# Patient Record
Sex: Female | Born: 1955
Health system: Southern US, Community
[De-identification: ages and names within clinical notes are randomized; demographics above are authoritative.]

## PROBLEM LIST (undated history)

## (undated) ENCOUNTER — Emergency Department (HOSPITAL_COMMUNITY): Admission: EM | Payer: Medicare HMO

## (undated) DIAGNOSIS — F32A Depression, unspecified: Secondary | ICD-10-CM

## (undated) DIAGNOSIS — F319 Bipolar disorder, unspecified: Secondary | ICD-10-CM

## (undated) DIAGNOSIS — F1120 Opioid dependence, uncomplicated: Secondary | ICD-10-CM

## (undated) DIAGNOSIS — G8929 Other chronic pain: Secondary | ICD-10-CM

## (undated) DIAGNOSIS — R531 Weakness: Secondary | ICD-10-CM

## (undated) DIAGNOSIS — C50919 Malignant neoplasm of unspecified site of unspecified female breast: Secondary | ICD-10-CM

## (undated) DIAGNOSIS — F329 Major depressive disorder, single episode, unspecified: Secondary | ICD-10-CM

## (undated) DIAGNOSIS — F419 Anxiety disorder, unspecified: Secondary | ICD-10-CM

## (undated) DIAGNOSIS — Z5189 Encounter for other specified aftercare: Secondary | ICD-10-CM

## (undated) DIAGNOSIS — K219 Gastro-esophageal reflux disease without esophagitis: Secondary | ICD-10-CM

## (undated) DIAGNOSIS — F1910 Other psychoactive substance abuse, uncomplicated: Secondary | ICD-10-CM

## (undated) DIAGNOSIS — R0600 Dyspnea, unspecified: Secondary | ICD-10-CM

## (undated) DIAGNOSIS — I509 Heart failure, unspecified: Secondary | ICD-10-CM

## (undated) DIAGNOSIS — G459 Transient cerebral ischemic attack, unspecified: Secondary | ICD-10-CM

## (undated) DIAGNOSIS — F332 Major depressive disorder, recurrent severe without psychotic features: Secondary | ICD-10-CM

## (undated) DIAGNOSIS — M549 Dorsalgia, unspecified: Secondary | ICD-10-CM

## (undated) DIAGNOSIS — I10 Essential (primary) hypertension: Secondary | ICD-10-CM

## (undated) DIAGNOSIS — N83209 Unspecified ovarian cyst, unspecified side: Secondary | ICD-10-CM

## (undated) DIAGNOSIS — A4902 Methicillin resistant Staphylococcus aureus infection, unspecified site: Secondary | ICD-10-CM

## (undated) DIAGNOSIS — B1920 Unspecified viral hepatitis C without hepatic coma: Secondary | ICD-10-CM

## (undated) DIAGNOSIS — N189 Chronic kidney disease, unspecified: Secondary | ICD-10-CM

## (undated) HISTORY — DX: Unspecified ovarian cyst, unspecified side: N83.209

## (undated) HISTORY — DX: Methicillin resistant Staphylococcus aureus infection, unspecified site: A49.02

## (undated) HISTORY — PX: MYOMECTOMY: SHX85

## (undated) HISTORY — DX: Gastro-esophageal reflux disease without esophagitis: K21.9

## (undated) HISTORY — DX: Weakness: R53.1

## (undated) HISTORY — DX: Other psychoactive substance abuse, uncomplicated: F19.10

## (undated) HISTORY — DX: Anxiety disorder, unspecified: F41.9

## (undated) HISTORY — PX: WISDOM TOOTH EXTRACTION: SHX21

## (undated) HISTORY — DX: Encounter for other specified aftercare: Z51.89

## (undated) HISTORY — PX: UPPER GASTROINTESTINAL ENDOSCOPY: SHX188

## (undated) HISTORY — DX: Malignant neoplasm of unspecified site of unspecified female breast: C50.919

## (undated) HISTORY — PX: COLONOSCOPY: SHX174

## (undated) HISTORY — DX: Major depressive disorder, recurrent severe without psychotic features: F33.2

## (undated) HISTORY — DX: Bipolar disorder, unspecified: F31.9

## (undated) HISTORY — DX: Chronic kidney disease, unspecified: N18.9

## (undated) HISTORY — PX: ESOPHAGOGASTRODUODENOSCOPY: SHX1529

## (undated) HISTORY — PX: BREAST SURGERY: SHX581

## (undated) HISTORY — PX: TOTAL ABDOMINAL HYSTERECTOMY: SHX209

## (undated) HISTORY — DX: Depression, unspecified: F32.A

## (undated) HISTORY — PX: MULTIPLE TOOTH EXTRACTIONS: SHX2053

## (undated) HISTORY — DX: Heart failure, unspecified: I50.9

---
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
## 1988-07-13 HISTORY — PX: BACK SURGERY: SHX140

## 1999-08-16 ENCOUNTER — Emergency Department (HOSPITAL_COMMUNITY): Admission: EM | Admit: 1999-08-16 | Discharge: 1999-08-16 | Payer: Self-pay | Admitting: Emergency Medicine

## 1999-08-16 ENCOUNTER — Encounter: Payer: Self-pay | Admitting: Emergency Medicine

## 1999-08-29 ENCOUNTER — Emergency Department (HOSPITAL_COMMUNITY): Admission: EM | Admit: 1999-08-29 | Discharge: 1999-08-29 | Payer: Self-pay | Admitting: Emergency Medicine

## 1999-09-05 ENCOUNTER — Emergency Department (HOSPITAL_COMMUNITY): Admission: EM | Admit: 1999-09-05 | Discharge: 1999-09-05 | Payer: Self-pay | Admitting: Emergency Medicine

## 2000-01-02 ENCOUNTER — Emergency Department (HOSPITAL_COMMUNITY): Admission: EM | Admit: 2000-01-02 | Discharge: 2000-01-02 | Payer: Self-pay | Admitting: Emergency Medicine

## 2000-02-24 ENCOUNTER — Emergency Department (HOSPITAL_COMMUNITY): Admission: EM | Admit: 2000-02-24 | Discharge: 2000-02-24 | Payer: Self-pay | Admitting: Emergency Medicine

## 2000-03-11 ENCOUNTER — Emergency Department (HOSPITAL_COMMUNITY): Admission: EM | Admit: 2000-03-11 | Discharge: 2000-03-11 | Payer: Self-pay | Admitting: *Deleted

## 2000-03-14 ENCOUNTER — Emergency Department (HOSPITAL_COMMUNITY): Admission: EM | Admit: 2000-03-14 | Discharge: 2000-03-14 | Payer: Self-pay | Admitting: Emergency Medicine

## 2000-04-09 ENCOUNTER — Emergency Department (HOSPITAL_COMMUNITY): Admission: EM | Admit: 2000-04-09 | Discharge: 2000-04-09 | Payer: Self-pay

## 2000-05-21 ENCOUNTER — Emergency Department (HOSPITAL_COMMUNITY): Admission: EM | Admit: 2000-05-21 | Discharge: 2000-05-21 | Payer: Self-pay | Admitting: *Deleted

## 2000-06-07 ENCOUNTER — Emergency Department (HOSPITAL_COMMUNITY): Admission: EM | Admit: 2000-06-07 | Discharge: 2000-06-07 | Payer: Self-pay | Admitting: Emergency Medicine

## 2000-06-07 ENCOUNTER — Encounter: Payer: Self-pay | Admitting: Emergency Medicine

## 2000-06-08 ENCOUNTER — Emergency Department (HOSPITAL_COMMUNITY): Admission: EM | Admit: 2000-06-08 | Discharge: 2000-06-08 | Payer: Self-pay | Admitting: Emergency Medicine

## 2000-07-13 HISTORY — PX: LAPAROSCOPIC CHOLECYSTECTOMY: SUR755

## 2000-08-03 ENCOUNTER — Emergency Department (HOSPITAL_COMMUNITY): Admission: EM | Admit: 2000-08-03 | Discharge: 2000-08-03 | Payer: Self-pay | Admitting: *Deleted

## 2000-08-08 ENCOUNTER — Emergency Department (HOSPITAL_COMMUNITY): Admission: EM | Admit: 2000-08-08 | Discharge: 2000-08-08 | Payer: Self-pay

## 2001-03-24 ENCOUNTER — Emergency Department (HOSPITAL_COMMUNITY): Admission: EM | Admit: 2001-03-24 | Discharge: 2001-03-24 | Payer: Self-pay | Admitting: Emergency Medicine

## 2001-04-06 ENCOUNTER — Emergency Department (HOSPITAL_COMMUNITY): Admission: EM | Admit: 2001-04-06 | Discharge: 2001-04-07 | Payer: Self-pay | Admitting: Emergency Medicine

## 2001-04-21 ENCOUNTER — Encounter: Payer: Self-pay | Admitting: Emergency Medicine

## 2001-04-21 ENCOUNTER — Emergency Department (HOSPITAL_COMMUNITY): Admission: EM | Admit: 2001-04-21 | Discharge: 2001-04-21 | Payer: Self-pay | Admitting: Emergency Medicine

## 2001-05-14 ENCOUNTER — Emergency Department (HOSPITAL_COMMUNITY): Admission: EM | Admit: 2001-05-14 | Discharge: 2001-05-14 | Payer: Self-pay | Admitting: *Deleted

## 2001-07-07 ENCOUNTER — Ambulatory Visit (HOSPITAL_COMMUNITY): Admission: RE | Admit: 2001-07-07 | Discharge: 2001-07-07 | Payer: Self-pay | Admitting: Neurosurgery

## 2001-07-20 ENCOUNTER — Ambulatory Visit (HOSPITAL_COMMUNITY): Admission: RE | Admit: 2001-07-20 | Discharge: 2001-07-20 | Payer: Self-pay | Admitting: Neurosurgery

## 2001-08-17 ENCOUNTER — Observation Stay (HOSPITAL_COMMUNITY): Admission: RE | Admit: 2001-08-17 | Discharge: 2001-08-18 | Payer: Self-pay | Admitting: Neurosurgery

## 2001-11-10 ENCOUNTER — Emergency Department (HOSPITAL_COMMUNITY): Admission: EM | Admit: 2001-11-10 | Discharge: 2001-11-10 | Payer: Self-pay | Admitting: Emergency Medicine

## 2001-12-07 ENCOUNTER — Emergency Department (HOSPITAL_COMMUNITY): Admission: EM | Admit: 2001-12-07 | Discharge: 2001-12-07 | Payer: Self-pay | Admitting: Emergency Medicine

## 2002-01-15 ENCOUNTER — Emergency Department (HOSPITAL_COMMUNITY): Admission: EM | Admit: 2002-01-15 | Discharge: 2002-01-16 | Payer: Self-pay | Admitting: Emergency Medicine

## 2002-01-21 ENCOUNTER — Emergency Department (HOSPITAL_COMMUNITY): Admission: EM | Admit: 2002-01-21 | Discharge: 2002-01-21 | Payer: Self-pay | Admitting: Emergency Medicine

## 2002-05-04 ENCOUNTER — Emergency Department (HOSPITAL_COMMUNITY): Admission: EM | Admit: 2002-05-04 | Discharge: 2002-05-04 | Payer: Self-pay | Admitting: *Deleted

## 2002-05-14 ENCOUNTER — Emergency Department (HOSPITAL_COMMUNITY): Admission: EM | Admit: 2002-05-14 | Discharge: 2002-05-15 | Payer: Self-pay

## 2002-07-31 ENCOUNTER — Ambulatory Visit (HOSPITAL_COMMUNITY): Admission: RE | Admit: 2002-07-31 | Discharge: 2002-08-02 | Payer: Self-pay | Admitting: Neurosurgery

## 2002-08-10 ENCOUNTER — Emergency Department (HOSPITAL_COMMUNITY): Admission: EM | Admit: 2002-08-10 | Discharge: 2002-08-10 | Payer: Self-pay

## 2002-10-20 ENCOUNTER — Emergency Department (HOSPITAL_COMMUNITY): Admission: EM | Admit: 2002-10-20 | Discharge: 2002-10-21 | Payer: Self-pay | Admitting: Emergency Medicine

## 2003-02-10 ENCOUNTER — Emergency Department (HOSPITAL_COMMUNITY): Admission: EM | Admit: 2003-02-10 | Discharge: 2003-02-10 | Payer: Self-pay | Admitting: Emergency Medicine

## 2003-02-12 ENCOUNTER — Emergency Department (HOSPITAL_COMMUNITY): Admission: EM | Admit: 2003-02-12 | Discharge: 2003-02-12 | Payer: Self-pay | Admitting: Emergency Medicine

## 2005-12-06 ENCOUNTER — Emergency Department (HOSPITAL_COMMUNITY): Admission: EM | Admit: 2005-12-06 | Discharge: 2005-12-07 | Payer: Self-pay | Admitting: Emergency Medicine

## 2006-03-22 ENCOUNTER — Emergency Department (HOSPITAL_COMMUNITY): Admission: EM | Admit: 2006-03-22 | Discharge: 2006-03-22 | Payer: Self-pay | Admitting: Emergency Medicine

## 2006-04-01 ENCOUNTER — Emergency Department (HOSPITAL_COMMUNITY): Admission: EM | Admit: 2006-04-01 | Discharge: 2006-04-01 | Payer: Self-pay | Admitting: Emergency Medicine

## 2008-07-13 HISTORY — PX: BREAST SURGERY: SHX581

## 2009-03-14 ENCOUNTER — Ambulatory Visit: Payer: Self-pay | Admitting: Gastroenterology

## 2009-05-02 ENCOUNTER — Ambulatory Visit: Payer: Self-pay | Admitting: Gastroenterology

## 2009-05-20 ENCOUNTER — Encounter (INDEPENDENT_AMBULATORY_CARE_PROVIDER_SITE_OTHER): Payer: Self-pay | Admitting: Interventional Radiology

## 2009-05-20 ENCOUNTER — Ambulatory Visit (HOSPITAL_COMMUNITY): Admission: RE | Admit: 2009-05-20 | Discharge: 2009-05-20 | Payer: Self-pay | Admitting: Gastroenterology

## 2009-06-13 ENCOUNTER — Ambulatory Visit: Payer: Self-pay | Admitting: Gastroenterology

## 2010-01-12 ENCOUNTER — Emergency Department (HOSPITAL_COMMUNITY)
Admission: EM | Admit: 2010-01-12 | Discharge: 2010-01-12 | Payer: Self-pay | Source: Home / Self Care | Admitting: Emergency Medicine

## 2010-09-28 LAB — URINALYSIS, ROUTINE W REFLEX MICROSCOPIC
Bilirubin Urine: NEGATIVE
Glucose, UA: NEGATIVE mg/dL
Hgb urine dipstick: NEGATIVE
Ketones, ur: NEGATIVE mg/dL
Nitrite: NEGATIVE
Protein, ur: NEGATIVE mg/dL
Specific Gravity, Urine: 1.013 (ref 1.005–1.030)
Urobilinogen, UA: 0.2 mg/dL (ref 0.0–1.0)
pH: 6 (ref 5.0–8.0)

## 2010-10-15 LAB — CBC
HCT: 39.8 % (ref 36.0–46.0)
Hemoglobin: 13.2 g/dL (ref 12.0–15.0)
MCHC: 33.1 g/dL (ref 30.0–36.0)
MCV: 89.8 fL (ref 78.0–100.0)
Platelets: 222 10*3/uL (ref 150–400)
RBC: 4.43 MIL/uL (ref 3.87–5.11)
RDW: 13.8 % (ref 11.5–15.5)
WBC: 6.6 10*3/uL (ref 4.0–10.5)

## 2010-10-15 LAB — PROTIME-INR
INR: 0.98 (ref 0.00–1.49)
Prothrombin Time: 12.9 seconds (ref 11.6–15.2)

## 2010-10-15 LAB — APTT: aPTT: 28 seconds (ref 24–37)

## 2010-11-28 NOTE — Discharge Summary (Signed)
NAME:  Sherry [name], Sherry [name] NO.:  [id number]   MEDICAL RECORD NO.:  [id number]                   PATIENT TYPE:  OIB   LOCATION:  3010                                 FACILITY:  [facility]   PHYSICIAN:  Ophelia Charter, M.D.            DATE OF BIRTH:  1956-01-10   DATE OF ADMISSION:  07/31/2002  DATE OF DISCHARGE:  08/02/2002                                 DISCHARGE SUMMARY   BRIEF HISTORY:  The patient is a 55 year old, white female who suffers from  back and right leg pain.  She failed medical management.  She was worked up  with a lumbar MRI, which demonstrated a herniated disc in L5-S1 on the  right.  The findings of this patient's signs, symptoms, and physical exam  were consistent with a right S1 radiculopathy.  I have discussed various  treatments with the patient including surgery.  The patient weighed the  risks, benefits, and alternatives to surgery and decided to proceed with a  microdiscectomy.   For further details of this admission, please refer to the history and  physical.   HOSPITAL COURSE:  I admitted the patient to [facility] on  07/31/2002.  The day of admission I performed a right L5-S1 microdiscectomy.  The surgery went well without complications.   POSTOPERATIVE COURSE:  The patient's postoperative course was fairly  unremarkable other than she had a rather atypical amount of pain, which was  controlled with morphine, and by postop day #1 she did not feel able to go  home and she stayed until postop day #2.  By then she was eating well and  ambulating well.  Her wound was healing well.  She was afebrile and her  vital signs were stable and her pain was under adequate control with p.o.  Percocet for pain and p.o. Valium for muscle spasms.   The patient was discharged on 08/02/2002.   DISCHARGE INSTRUCTIONS:  The patient was given written discharge  instructions.  Instructed to follow up with me in four weeks.   DISCHARGE MEDICATIONS:  Valium 10 mg #42, one p.o. q.8h. p.r.n. for muscle  spasm, one refill, Percocet 10/325 #100 one p.o. q.3h. for pain and she was  given a second prescription for Percocet 10/325 #100 one p.o. q.4h. for  pain, which was not to be filled until 08/14/2002.   I did have a discussion with the patient.  She has a history of drug abuse  and I explained to her that it was important to follow my directions  regarding the medication usage and that if she abuses medications she could  severely harm herself including overdose, liver failure, etc.  She  understands these risks.  I explained to her that I would not refill her  prescriptions early.   FINAL DIAGNOSES:  Right L5-S1 herniated pulposus procedure performed.  This  right L5-S1 microdiscectomy using microdissection.  Ophelia Charter, M.D.   JDJ/MEDQ  D:  08/02/2002  T:  08/03/2002  Job:  [id number]

## 2010-11-28 NOTE — Op Note (Signed)
[place]  Patient:    Sherry [name], Sherry [name] Visit Number: [id number] MRN: [id number]          Service Type: SUR Location: [place] 01 Attending Physician:  Ophelia Charter Dictated by:   Ophelia Charter, M.D. Proc. Date: 08/17/01 Admit Date:  08/17/2001 Discharge Date: 08/18/2001                             Operative Report  PREOPERATIVE DIAGNOSIS:  C4-5 and C5-6 spondylosis, degenerative disease, stenosis, cervicalgia and cervical radiculopathy.  POSTOPERATIVE DIAGNOSIS:  C4-5 and C5-6 spondylosis, degenerative disease, stenosis, cervicalgia and cervical radiculopathy.  OPERATION:  C4-5 and C5-6 extensive anterior cervical diskectomy, interbody iliac crest allograft, arthrodesis, anterior cervical plating.  Codman titanium plate and screws.  SURGEON:  Ophelia Charter, M.D.  ASSISTANT:  Elizabeth Sauer, M.D.  ANESTHESIA:  General endotracheal.  ESTIMATED BLOOD LOSS:   150 cc.  SPECIMENS:  None.  INDICATIONS:  The patient is a 55 year old black female who has suffered from neck and bilateral arm pain.  She failed medical management and was worked up as an outpatient with a cervical MRI followed by cervical CT which demonstrated cervical spondylosis at C4-5 and C5-6.  She weighed the risks, benefits and alternatives to surgery and decided to proceed with a C4-5, C5-6 anterior cervical diskectomy and fusion and plating.  DESCRIPTION OF PROCEDURE:  The patient was brought to the operating room by the anesthesia team.  General endotracheal anesthesia was induced.  The patient remained in a supine position.  A roll was placed under her shoulders to place her neck in slight extension.  The anterior cervical region was then prepared with Betadine scrub and Betadine solution.  Sterile drapes were applied.  I then injected the area to be incised with Marcaine and with epinephrine solution.  I then made a transverse incision in the patients  left anterior neck.  I used Metzenbaum scissors to divide the platysma muscle and then dissected medial to the sternocleidomastoid muscle, jugular vein and carotid artery. I bluntly dissected down toward the anterior cervical spine, carefully identifying the esophagus, retracting it medially.  I cleared the soft tissue from the anterior cervical spine using Kittner swabs and then inserted the spinal needle into the upper exposed interspace.  I then obtained intraoperative radiograph to confirm my location.  I then used electrocautery to detach the medial border of the longus coli muscle bilaterally from the C4-5 and C5-6 intervertebral disk space.  I inserted the Caspar self-retaining retractor for exposure.  I then incised the C4-5 intervertebral disk with the 15 blade scalpel.  I encountered anterior bone spurs which I drilled away with the high speed drill. I then performed a partial diskectomy with pituitary forceps and inserted the traction sutures in C4 and C5 and then distracted the interspace.  I then brought the operative microscope into the field.  Under its illumination and magnification, I completed the decompression.  I used the high speed drill to decorticate the vertebral end plates at C4 and C5.  The disk space was quite collapsed and spondylotic.  I then drilled away the remainder of the intravertebral disk.  I drilled away some large posterior osteophytes which were compressing the thecal sac.  I tented up the posterior longitudinal ligament with the drill and then incised it with a arachnoid knife and removed it with the Kerrison punch undercutting the vertebral end plates at C4  and C5.  I performed a foraminotomy about the bilateral C5 nerve root.  I then repeated this procedure at C5-6.  I excised the disk and performed partial diskectomy with the pituitary forceps.  I removed the distraction screw from C4, placed it in C5-6 interspace and then used the high speed  drill to decorticate the vertebral end plates at 075-GRM and drill away the remainder of the invertebral disk at C5-6.  I thinned out the posterior longitudinal ligament.  The bone spurs were not quite as large at this level.  I incised the ligament with the arachnoid knife and then removed it with the Kerrison punch undercutting the vertebral end plates at C5 and C6, decompressing the thecal sac and then performed a foraminotomy about the bilateral C6 nerve root.  I now turned my attention to the arthrodesis.  I obtained iliac crest tricortical allograft bone graft and fashioned it across the dimensions approximately 7 mm in height, 1 cm in depth.  I inserted the blunt bone graft into the C5-6 interspace, removed the distraction screw from the C6, placed it back at C4, dissected C4-5 interspace and placed a second bone graft in the C4-5 interspace.  I removed the distraction screws.  There was a generous amount of _____ bone graft at both levels.  I now turned my attention to the anterior spinal instrumentation.  I used the high speed drill to remove some further anterior bone spurs so that the plate would lie flat.  I obtained the appropriate length Codman anterior cervical plate and laid it along the anterior aspect of the vertebral bodies from C4 to C6, drilled two holes at each vertebrae, tapped the holes and then secured the plate with two 12 mm screws at C4, C5 and C6.  I then obtained an intraoperative radiograph.  There was limited visualization of the lower screws but they appeared good.  I then secured the screws to the plate using the cam tightener at each screw.  I then achieved stringent hemostasis using bipolar electrocautery. Dictated by:   Ophelia Charter, M.D. Attending Physician:  Newman Pies D DD:  08/17/01 TD:  08/18/01 Job: [id number]

## 2010-11-28 NOTE — Op Note (Signed)
[place]  Patient:    Sherry [name], Sherry [name] Visit Number: [id number] MRN: [id number]          Service Type: SUR Location: [place] 01 Attending Physician:  Ophelia Charter Dictated by:   Ophelia Charter, M.D. Admit Date:  08/17/2001 Discharge Date: 08/18/2001                             Operative Report  I achieved stringent hemostasis using bipolar electrocautery.  I copiously irrigated the wound with bacitracin solution and removed the solution.  I then removed the Caspar self-retaining retractor.  I inspected the esophagus for any damage and there was none apparent.  I then reapproximated the patients platysma muscle with interrupted 3-0 Vicryl suture, the subcutaneous tissue with interrupted 3-0 Vicryl suture, and the skin with Steri-Strips and benzoin.  The wound was then coated with bacitracin ointment, a sterile dressing applied, and the drapes were removed.   The patient was subsequently extubated by the anesthesia team and transported to the post anesthesia care unit in stable condition.  All sponge, needle and instrument counts were correct at the end of this case. Dictated by:   Ophelia Charter, M.D. Attending Physician:  Newman Pies D DD:  08/17/01 TD:  08/18/01 Job: [id number]

## 2010-11-28 NOTE — Op Note (Signed)
NAME:  Sherry [name], Sherry [name] NO.:  [id number]   MEDICAL RECORD NO.:  [id number]                   PATIENT TYPE:  OIB   LOCATION:  3010                                 FACILITY:  [place]   PHYSICIAN:  Ophelia Charter, M.D.            DATE OF BIRTH:  1955-12-25   DATE OF PROCEDURE:  07/31/2002  DATE OF DISCHARGE:                                 OPERATIVE REPORT   INDICATIONS:  This is a 55 year old black female who suffered from back and  right leg pain.  She has failed medical management.  She was worked up with  a lumbar MRI which demonstrated a herniated disk at L5-S1 on the right.  The  patient's signs and symptoms on her examination were consistent with a right  S1 radiculopathy.  I discussed the various treatment options with her  including surgery.  The patient weighed the risks, benefits and alternatives  of surgery and decided to proceed with a microdiskectomy.   PREOPERATIVE DIAGNOSES:  1. Right L5-S1 herniated nucleus pulposus.  2. Spinal stenosis.  3. Degenerative disk disease.  4. Spondylosis.  5. Lumbar radiculopathy.  6. Lumbago.   POSTOPERATIVE DIAGNOSES:  1. Right L5-S1 herniated nucleus pulposus.  2. Spinal stenosis.  3. Degenerative disk disease.  4. Spondylosis.  5. Lumbar radiculopathy.  6. Lumbago.   PROCEDURE:  Right L5 to S1 microdiskectomy using microdissection.   SURGEON:  Ophelia Charter, M.D.   ASSISTANT:  Marchia Meiers. Vertell Limber, M.D.   ANESTHESIA:  General endotracheal.   ESTIMATED BLOOD LOSS:  Minimal.   SPECIMENS:  None.   DRAINS:  None.   COMPLICATIONS:  None.   DESCRIPTION OF PROCEDURE:  The patient was brought to the operating room by  the anesthesia team.  General endotracheal anesthesia was induced.  The  patient was turned to the prone position on the Wilson frame.  The  lumbosacral region was then prepared with Betadine scrub followed by  Betadine solution.  Sterile drapes were then applied.  I then  injected the  anteromedial spine with Marcaine with epinephrine solution.  I used a  scalpel to make a linear midline incision over the L5-S1 interspace.  I used  electrocautery to dissect down through the thoracolumbar fascia, dividing  the fascia just to the right of midline, performing a right-sided  subperiosteal dissection superior to the paraspinous musculature from the  right spinous process and lamina of L5 in the upper sacrum, inserting the  McCullough retractor for exposure. I then obtained an intraoperative  radiograph to confirm the location.   I then used the high-speed drill to perform a right L5 laminotomy.  I  widened the laminotomy with a Kerrison punch and removed the right L5-S1  ligamentum flavum using the Kerrison punch.  I then performed a foraminotomy  about the right S1 nerve root.  I used the microdissectors to free up the  thecal sac  and the S1 nerve root from the epidural tissue, and then Dr.  Vertell Limber gently retracted the nerve root medially with the D'Errico retractors.  This exposed an underlying herniated disk which had migrated caudally from  the disk space.  I incised into the herniated disk and removed several  fragments of disk from just ventral to the S1 vertebral body.  There was  considerable spondylosis at the endplates at 075-GRM, and we removed this with  the osteophyte tool.  We performed a partial intervertebral diskectomy using  the pituitary forceps and the Epstein curettes.  At this point we palpated  along the ventral surface of the thecal sac and along the exit boundary of  the S1 nerve root and noted that neural structures were well decompressed.  We obtained stringent hemostasis using bipolar electrocautery.  The wound  was copiously irrigated with bacitracin solution.  The solution was removed,  as was the [facility].  The patient's thoracolumbar fascia was  reapproximated with interrupted #1 Vicryl suture, subcutaneous with   interrupted 2-0 Vicryl suture, and the skin with Steri-Strips and Benzoin.  The wound was then coated with bacitracin ointment.  Sterile dressings were  applied, and the drapes were removed.  The patient was subsequently returned  to the supine position where she was extubated by the anesthesia team and  transported to the postanesthesia care unit in stable condition.  All  sponge, instrument and needle counts were correct at the end of this case.                                               Ophelia Charter, M.D.    JDJ/MEDQ  D:  07/31/2002  T:  08/01/2002  Job:  [id number]

## 2011-09-13 ENCOUNTER — Encounter (HOSPITAL_COMMUNITY): Payer: Self-pay | Admitting: *Deleted

## 2011-09-13 ENCOUNTER — Emergency Department (HOSPITAL_COMMUNITY)
Admission: EM | Admit: 2011-09-13 | Discharge: 2011-09-13 | Disposition: A | Discharge status: Home / Self Care | Payer: Medicare Other | Attending: Emergency Medicine | Admitting: Emergency Medicine

## 2011-09-13 ENCOUNTER — Emergency Department (HOSPITAL_COMMUNITY): Payer: Medicare Other

## 2011-09-13 DIAGNOSIS — IMO0001 Reserved for inherently not codable concepts without codable children: Secondary | ICD-10-CM

## 2011-09-13 DIAGNOSIS — M79609 Pain in unspecified limb: Secondary | ICD-10-CM | POA: Insufficient documentation

## 2011-09-13 DIAGNOSIS — W19XXXA Unspecified fall, initial encounter: Secondary | ICD-10-CM

## 2011-09-13 DIAGNOSIS — Y9229 Other specified public building as the place of occurrence of the external cause: Secondary | ICD-10-CM | POA: Insufficient documentation

## 2011-09-13 DIAGNOSIS — I10 Essential (primary) hypertension: Secondary | ICD-10-CM | POA: Insufficient documentation

## 2011-09-13 DIAGNOSIS — R079 Chest pain, unspecified: Secondary | ICD-10-CM | POA: Insufficient documentation

## 2011-09-13 DIAGNOSIS — IMO0002 Reserved for concepts with insufficient information to code with codable children: Secondary | ICD-10-CM | POA: Insufficient documentation

## 2011-09-13 DIAGNOSIS — M545 Low back pain, unspecified: Secondary | ICD-10-CM | POA: Insufficient documentation

## 2011-09-13 DIAGNOSIS — W010XXA Fall on same level from slipping, tripping and stumbling without subsequent striking against object, initial encounter: Secondary | ICD-10-CM | POA: Insufficient documentation

## 2011-09-13 HISTORY — DX: Essential (primary) hypertension: I10

## 2011-09-13 MED ORDER — HYDROCODONE-ACETAMINOPHEN 5-325 MG PO TABS: 2.0000 | ORAL_TABLET | ORAL | Status: AC | PRN

## 2011-09-13 MED ORDER — DIAZEPAM 5 MG PO TABS: 5.0000 mg | ORAL_TABLET | Freq: Two times a day (BID) | ORAL | Status: AC

## 2011-09-13 MED ORDER — OXYCODONE-ACETAMINOPHEN 5-325 MG PO TABS
1.0000 | ORAL_TABLET | Freq: Once | ORAL | Status: AC
  Administered 2011-09-13: 1 via ORAL
  Filled 2011-09-13: qty 1

## 2011-09-13 NOTE — ED Provider Notes (Signed)
History     CSN: [id number]  Arrival date & time 09/13/11  0909   First MD Initiated Contact with Patient 09/13/11 [phone]      Chief Complaint  Patient presents with  . Fall  . Back Pain  . Leg Pain    right  . Flank Pain    left    (Consider location/radiation/quality/duration/timing/severity/associated sxs/prior treatment) HPI  56 year old obese female with history of prior back surgery is presenting to the ED with a chief complaint of back pain. He states 2 days ago she was at a store where she actually tripped over a dolly and fell forward. She denies hitting head or having loss of consciousness. She landed on her left side and initially having some pain to the associated left chest wall.  She denies any shortness of breath. However the next day she noticed increasing pain to her low back and pain radiating down to her right leg. Pain worsened with sitting or with certain movement. She denies urinary or bowel incontinence, cauda equina symptoms, weakness or numbness.    Past Medical History  Diagnosis Date  . Hypertension     Past Surgical History  Procedure Date  . Back surgery   . Cholecystectomy   . Myomectomy   . Abdominal hysterectomy     History reviewed. No pertinent family history.  History  Substance Use Topics  . Smoking status: Current Everyday Smoker -- 1.0 packs/day    Types: Cigarettes  . Smokeless tobacco: Never Used  . Alcohol Use: No    OB History    Grav Para Term Preterm Abortions TAB SAB Ect Mult Living                  Review of Systems  All other systems reviewed and are negative.    Allergies  Review of patient's allergies indicates no known allergies.  Home Medications  No current outpatient prescriptions on file.  BP 116/73  Pulse 80  Temp(Src) 98.1 F (36.7 C) (Oral)  Resp 18  Wt 243 lb (110.224 kg)  SpO2 99%  Physical Exam  Nursing note and vitals reviewed. Constitutional: She appears well-developed and  well-nourished. No distress.       Obese  HENT:  Head: Atraumatic.  Eyes: Conjunctivae are normal.  Neck: Neck supple.  Cardiovascular: Normal rate and regular rhythm.   Pulmonary/Chest: Effort normal. No respiratory distress. She exhibits no tenderness.  Abdominal: Soft.  Musculoskeletal:       Right knee: Normal.       Left knee: Normal.       Cervical back: Normal.       Thoracic back: Normal.       Lumbar back: She exhibits decreased range of motion, tenderness and bony tenderness. She exhibits no swelling and no deformity.    ED Course  Procedures (including critical care time)  Labs Reviewed - No data to display No results found.   No diagnosis found.  Dg Lumbar Spine Complete  09/13/2011  *RADIOLOGY REPORT*  Clinical Data: Fall.  Back pain.  Leg pain.  Flank pain.  LUMBAR SPINE - COMPLETE 4+ VIEW  Comparison: 01/12/2010  Findings: There are surgical clips within the right upper quadrant. Degenerate changes are identified at L5-S1.  No evidence for acute fracture or subluxation.  No lytic or blastic lesions.  Regional bowel gas pattern is nonobstructive.  Degenerative changes are seen in the hips.  IMPRESSION:  1.  Degenerative changes. 2. No evidence for acute  abnormality.  Original Report Authenticated By: Glenice Bow, M.D.      MDM  Low back pain secondary to fall.  Xray of low back ordered.  Pain medication given.  Pt able to ambulate.    10:45 AM Lspine xray shows no evidence of acute fracture or dislocation.  Due to her body habitus and radiating pain, i will prescribe pain medication and f/u instruction.        Domenic Moras, PA-C 09/13/11 1054

## 2011-09-13 NOTE — ED Notes (Signed)
Pt from home with reports of falling on Friday at a store after tripping over a piece of equipment and injuring left side. Pt reports that within 12 hours lower back pain with pain radiating down right leg also started.

## 2011-09-13 NOTE — Discharge Instructions (Signed)
Lumbosacral Radiculopathy Lumbosacral radiculopathy is a pinched nerve or nerves in the low back (lumbosacral area). When this happens you may have weakness in your legs and may not be able to stand on your toes. You may have pain going down into your legs. There may be difficulties with walking normally. There are many causes of this problem. Sometimes this may happen from an injury, or simply from arthritis or boney problems. It may also be caused by other illnesses such as diabetes. If there is no improvement after treatment, further studies may be done to find the exact cause. DIAGNOSIS  X-rays may be needed if the problems become long standing. Electromyograms may be done. This study is one in which the working of nerves and muscles is studied. HOME CARE INSTRUCTIONS   Applications of ice packs may be helpful. Ice can be used in a plastic bag with a towel around it to prevent frostbite to skin. This may be used every 2 hours for 20 to 30 minutes, or as needed, while awake, or as directed by your caregiver.   Only take over-the-counter or prescription medicines for pain, discomfort, or fever as directed by your caregiver.   If physical therapy was prescribed, follow your caregiver's directions.  SEEK IMMEDIATE MEDICAL CARE IF:   You have pain not controlled with medications.   You seem to be getting worse rather than better.   You develop increasing weakness in your legs.   You develop loss of bowel or bladder control.   You have difficulty with walking or balance, or develop clumsiness in the use of your legs.   You have a fever.  MAKE SURE YOU:   Understand these instructions.   Will watch your condition.   Will get help right away if you are not doing well or get worse.  Document Released: 06/29/2005 Document Revised: 06/18/2011 Document Reviewed: 02/17/2008 [facility] Patient Information 2012 [place].

## 2011-09-14 NOTE — ED Provider Notes (Signed)
Medical screening examination/treatment/procedure(s) were performed by non-physician practitioner and as supervising physician I was immediately available for consultation/collaboration.  Richarda Blade, MD 09/14/11 [phone]

## 2012-12-17 DIAGNOSIS — Z8619 Personal history of other infectious and parasitic diseases: Secondary | ICD-10-CM

## 2012-12-17 HISTORY — DX: Personal history of other infectious and parasitic diseases: Z86.19

## 2013-02-13 DIAGNOSIS — G2401 Drug induced subacute dyskinesia: Secondary | ICD-10-CM

## 2013-02-13 DIAGNOSIS — Z853 Personal history of malignant neoplasm of breast: Secondary | ICD-10-CM | POA: Insufficient documentation

## 2013-02-13 DIAGNOSIS — R0600 Dyspnea, unspecified: Secondary | ICD-10-CM | POA: Insufficient documentation

## 2013-02-13 DIAGNOSIS — G2581 Restless legs syndrome: Secondary | ICD-10-CM

## 2013-02-13 DIAGNOSIS — R0689 Other abnormalities of breathing: Secondary | ICD-10-CM

## 2013-02-13 HISTORY — DX: Drug induced subacute dyskinesia: G24.01

## 2013-02-13 HISTORY — DX: Dyspnea, unspecified: R06.00

## 2013-02-13 HISTORY — DX: Personal history of malignant neoplasm of breast: Z85.3

## 2013-02-13 HISTORY — DX: Restless legs syndrome: G25.81

## 2013-02-13 HISTORY — DX: Dyspnea, unspecified: R06.89

## 2013-02-13 HISTORY — DX: Morbid (severe) obesity due to excess calories: E66.01

## 2014-09-21 DIAGNOSIS — I158 Other secondary hypertension: Secondary | ICD-10-CM | POA: Insufficient documentation

## 2014-09-21 DIAGNOSIS — Z79899 Other long term (current) drug therapy: Secondary | ICD-10-CM | POA: Diagnosis not present

## 2014-09-21 DIAGNOSIS — F329 Major depressive disorder, single episode, unspecified: Secondary | ICD-10-CM | POA: Insufficient documentation

## 2014-09-21 DIAGNOSIS — J0100 Acute maxillary sinusitis, unspecified: Secondary | ICD-10-CM | POA: Diagnosis not present

## 2014-09-21 DIAGNOSIS — I10 Essential (primary) hypertension: Secondary | ICD-10-CM | POA: Diagnosis present

## 2014-09-21 DIAGNOSIS — Z720 Tobacco use: Secondary | ICD-10-CM | POA: Insufficient documentation

## 2014-09-21 NOTE — ED Notes (Signed)
Per [facility] staff no narcotic

## 2014-09-22 ENCOUNTER — Encounter (HOSPITAL_BASED_OUTPATIENT_CLINIC_OR_DEPARTMENT_OTHER): Payer: Self-pay | Admitting: *Deleted

## 2014-09-22 ENCOUNTER — Emergency Department (HOSPITAL_BASED_OUTPATIENT_CLINIC_OR_DEPARTMENT_OTHER)
Admission: EM | Admit: 2014-09-22 | Discharge: 2014-09-22 | Disposition: A | Discharge status: Home / Self Care | Payer: Medicare Other | Attending: Emergency Medicine | Admitting: Emergency Medicine

## 2014-09-22 DIAGNOSIS — I158 Other secondary hypertension: Secondary | ICD-10-CM

## 2014-09-22 DIAGNOSIS — J0100 Acute maxillary sinusitis, unspecified: Secondary | ICD-10-CM

## 2014-09-22 MED ORDER — AZITHROMYCIN 250 MG PO TABS: ORAL_TABLET | ORAL | Status: DC

## 2014-09-22 MED ORDER — METOPROLOL TARTRATE 50 MG PO TABS
50.0000 mg | ORAL_TABLET | Freq: Once | ORAL | Status: AC
  Administered 2014-09-22: 50 mg via ORAL
  Filled 2014-09-22: qty 1

## 2014-09-22 MED ORDER — LABETALOL HCL 100 MG PO TABS
100.0000 mg | ORAL_TABLET | Freq: Once | ORAL | Status: DC
  Filled 2014-09-22: qty 1

## 2014-09-22 MED ORDER — LABETALOL HCL 100 MG PO TABS: 100.0000 mg | ORAL_TABLET | Freq: Two times a day (BID) | ORAL | Status: DC

## 2014-09-22 NOTE — ED Notes (Addendum)
C/o dizziness and htn while cleaning this pm, has not had labetalol in 3 weeks

## 2014-09-22 NOTE — ED Notes (Signed)
Was cleaning house got dizzy  Room spinning  layed down then went had bp checked was 181/117,  Went back rested 45 minutes check bp again was 190/------  Slight dizziness at present w ha

## 2014-09-22 NOTE — ED Provider Notes (Signed)
CSN: [id number]     Arrival date & time 09/21/14  2334 History   First MD Initiated Contact with Patient 09/22/14 0115     Chief Complaint  Patient presents with  . Hypertension     (Consider location/radiation/quality/duration/timing/severity/associated sxs/prior Treatment) HPI Comments: Patient here complaining of increased blood pressure prior to arrival. She is now her labetalol for the past 3 weeks. Denies any anginal type chest pain. No severe headaches noted. Also complains of 3 weeks of sinus pressure without fever or chills. No vomiting appreciated. Has been compliant with her other medications. Nothing makes her symptoms be  Patient is a 59 y.o. female presenting with hypertension. The history is provided by the patient.  Hypertension    Past Medical History  Diagnosis Date  . Hypertension   . Depression (emotion)    Past Surgical History  Procedure Laterality Date  . Back surgery    . Cholecystectomy    . Myomectomy    . Abdominal hysterectomy     History reviewed. No pertinent family history. History  Substance Use Topics  . Smoking status: Current Every Day Smoker -- 1.00 packs/day    Types: Cigarettes  . Smokeless tobacco: Never Used  . Alcohol Use: No   OB History    No data available     Review of Systems  All other systems reviewed and are negative.     Allergies  Review of patient's allergies indicates no known allergies.  Home Medications   Prior to Admission medications   Medication Sig Start Date End Date Taking? Authorizing Provider  labetalol (NORMODYNE) 100 MG tablet Take 100 mg by mouth 2 (two) times daily.   Yes Historical Provider, MD  ARIPiprazole (ABILIFY) 10 MG tablet Take 10 mg by mouth daily.    Historical Provider, MD  FLUoxetine (PROZAC) 20 MG capsule Take 20 mg by mouth daily.    Historical Provider, MD  hydrochlorothiazide (HYDRODIURIL) 25 MG tablet Take 25 mg by mouth daily.    Historical Provider, MD  traZODone (DESYREL)  100 MG tablet Take 100 mg by mouth at bedtime.    Historical Provider, MD  zolpidem (AMBIEN) 10 MG tablet Take 10 mg by mouth at bedtime as needed.    Historical Provider, MD   BP 156/95 mmHg  Pulse 87  Temp(Src) 98.4 F (36.9 C) (Oral)  Resp 20  Ht 5\' 2"  (1.575 m)  Wt 237 lb (107.502 kg)  BMI 43.34 kg/m2  SpO2 97% Physical Exam  Constitutional: She is oriented to person, place, and time. She appears well-developed and well-nourished.  Non-toxic appearance. No distress.  HENT:  Head: Normocephalic and atraumatic.    Eyes: Conjunctivae, EOM and lids are normal. Pupils are equal, round, and reactive to light.  Neck: Normal range of motion. Neck supple. No tracheal deviation present. No thyroid mass present.  Cardiovascular: Normal rate, regular rhythm and normal heart sounds.  Exam reveals no gallop.   No murmur heard. Pulmonary/Chest: Effort normal and breath sounds normal. No stridor. No respiratory distress. She has no decreased breath sounds. She has no wheezes. She has no rhonchi. She has no rales.  Abdominal: Soft. Normal appearance and bowel sounds are normal. She exhibits no distension. There is no tenderness. There is no rebound and no CVA tenderness.  Musculoskeletal: Normal range of motion. She exhibits no edema or tenderness.  Neurological: She is alert and oriented to person, place, and time. She has normal strength. No cranial nerve deficit or sensory deficit. GCS eye  subscore is 4. GCS verbal subscore is 5. GCS motor subscore is 6.  Skin: Skin is warm and dry. No abrasion and no rash noted.  Psychiatric: She has a normal mood and affect. Her speech is normal and behavior is normal.  Nursing note and vitals reviewed.   ED Course  Procedures (including critical care time) Labs Review Labs Reviewed - No data to display  Imaging Review No results found.   EKG Interpretation None      MDM   Final diagnoses:  None    Patient given labetalol 100 mg by mouth  here. Will refill her prescription and also placed on Z-Pak for probable sinusitis, maxillary.    Lacretia Leigh, MD 09/22/14 [phone]

## 2014-09-22 NOTE — ED Notes (Signed)
Patient wanted BP taken from forearm as initial set of vitals were being taken. After reading of 191/99, I sugested letting me take BP again with larger cuff on upper arm. Patient agreed, but she took cuff off before I got a reading. Patient stated "I will not continue, is too painful"

## 2014-09-22 NOTE — Discharge Instructions (Signed)
Sinusitis Sinusitis is redness, soreness, and inflammation of the paranasal sinuses. Paranasal sinuses are air pockets within the bones of your face (beneath the eyes, the middle of the forehead, or above the eyes). In healthy paranasal sinuses, mucus is able to drain out, and air is able to circulate through them by way of your nose. However, when your paranasal sinuses are inflamed, mucus and air can become trapped. This can allow bacteria and other germs to grow and cause infection. Sinusitis can develop quickly and last only a short time (acute) or continue over a long period (chronic). Sinusitis that lasts for more than 12 weeks is considered chronic.  CAUSES  Causes of sinusitis include:  Allergies.  Structural abnormalities, such as displacement of the cartilage that separates your nostrils (deviated septum), which can decrease the air flow through your nose and sinuses and affect sinus drainage.  Functional abnormalities, such as when the small hairs (cilia) that line your sinuses and help remove mucus do not work properly or are not present. SIGNS AND SYMPTOMS  Symptoms of acute and chronic sinusitis are the same. The primary symptoms are pain and pressure around the affected sinuses. Other symptoms include:  Upper toothache.  Earache.  Headache.  Bad breath.  Decreased sense of smell and taste.  A cough, which worsens when you are lying flat.  Fatigue.  Fever.  Thick drainage from your nose, which often is green and may contain pus (purulent).  Swelling and warmth over the affected sinuses. DIAGNOSIS  Your health care provider will perform a physical exam. During the exam, your health care provider may:  Look in your nose for signs of abnormal growths in your nostrils (nasal polyps).  Tap over the affected sinus to check for signs of infection.  View the inside of your sinuses (endoscopy) using an imaging device that has a light attached (endoscope). If your health  care provider suspects that you have chronic sinusitis, one or more of the following tests may be recommended:  Allergy tests.  Nasal culture. A sample of mucus is taken from your nose, sent to a lab, and screened for bacteria.  Nasal cytology. A sample of mucus is taken from your nose and examined by your health care provider to determine if your sinusitis is related to an allergy. TREATMENT  Most cases of acute sinusitis are related to a viral infection and will resolve on their own within 10 days. Sometimes medicines are prescribed to help relieve symptoms (pain medicine, decongestants, nasal steroid sprays, or saline sprays).  However, for sinusitis related to a bacterial infection, your health care provider will prescribe antibiotic medicines. These are medicines that will help kill the bacteria causing the infection.  Rarely, sinusitis is caused by a fungal infection. In theses cases, your health care provider will prescribe antifungal medicine. For some cases of chronic sinusitis, surgery is needed. Generally, these are cases in which sinusitis recurs more than 3 times per year, despite other treatments. HOME CARE INSTRUCTIONS   Drink plenty of water. Water helps thin the mucus so your sinuses can drain more easily.  Use a humidifier.  Inhale steam 3 to 4 times a day (for example, sit in the bathroom with the shower running).  Apply a warm, moist washcloth to your face 3 to 4 times a day, or as directed by your health care provider.  Use saline nasal sprays to help moisten and clean your sinuses.  Take medicines only as directed by your health care provider.    If you were prescribed either an antibiotic or antifungal medicine, finish it all even if you start to feel better. SEEK IMMEDIATE MEDICAL CARE IF:  You have increasing pain or severe headaches.  You have nausea, vomiting, or drowsiness.  You have swelling around your face.  You have vision problems.  You have a stiff  neck.  You have difficulty breathing. MAKE SURE YOU:   Understand these instructions.  Will watch your condition.  Will get help right away if you are not doing well or get worse. Document Released: 06/29/2005 Document Revised: 11/13/2013 Document Reviewed: 07/14/2011 Exit[employer] Patient Information 2015 [employer]. This information is not intended to replace advice given to you by your health care provider. Make sure you discuss any questions you have with your health care provider. Hypertension Hypertension, commonly called high blood pressure, is when the force of blood pumping through your arteries is too strong. Your arteries are the blood vessels that carry blood from your heart throughout your body. A blood pressure reading consists of a higher number over a lower number, such as 110/72. The higher number (systolic) is the pressure inside your arteries when your heart pumps. The lower number (diastolic) is the pressure inside your arteries when your heart relaxes. Ideally you want your blood pressure below 120/80. Hypertension forces your heart to work harder to pump blood. Your arteries may become narrow or stiff. Having hypertension puts you at risk for heart disease, stroke, and other problems.  RISK FACTORS Some risk factors for high blood pressure are controllable. Others are not.  Risk factors you cannot control include:   Race. You may be at higher risk if you are African American.  Age. Risk increases with age.  Gender. Men are at higher risk than women before age 45 years. After age 65, women are at higher risk than men. Risk factors you can control include:  Not getting enough exercise or physical activity.  Being overweight.  Getting too much fat, sugar, calories, or salt in your diet.  Drinking too much alcohol. SIGNS AND SYMPTOMS Hypertension does not usually cause signs or symptoms. Extremely high blood pressure (hypertensive crisis) may cause headache,  anxiety, shortness of breath, and nosebleed. DIAGNOSIS  To check if you have hypertension, your health care provider will measure your blood pressure while you are seated, with your arm held at the level of your heart. It should be measured at least twice using the same arm. Certain conditions can cause a difference in blood pressure between your right and left arms. A blood pressure reading that is higher than normal on one occasion does not mean that you need treatment. If one blood pressure reading is high, ask your health care provider about having it checked again. TREATMENT  Treating high blood pressure includes making lifestyle changes and possibly taking medicine. Living a healthy lifestyle can help lower high blood pressure. You may need to change some of your habits. Lifestyle changes may include:  Following the DASH diet. This diet is high in fruits, vegetables, and whole grains. It is low in salt, red meat, and added sugars.  Getting at least 2 hours of brisk physical activity every week.  Losing weight if necessary.  Not smoking.  Limiting alcoholic beverages.  Learning ways to reduce stress. If lifestyle changes are not enough to get your blood pressure under control, your health care provider may prescribe medicine. You may need to take more than one. Work closely with your health care provider to   understand the risks and benefits. HOME CARE INSTRUCTIONS  Have your blood pressure rechecked as directed by your health care provider.   Take medicines only as directed by your health care provider. Follow the directions carefully. Blood pressure medicines must be taken as prescribed. The medicine does not work as well when you skip doses. Skipping doses also puts you at risk for problems.   Do not smoke.   Monitor your blood pressure at home as directed by your health care provider. SEEK MEDICAL CARE IF:   You think you are having a reaction to medicines taken.  You  have recurrent headaches or feel dizzy.  You have swelling in your ankles.  You have trouble with your vision. SEEK IMMEDIATE MEDICAL CARE IF:  You develop a severe headache or confusion.  You have unusual weakness, numbness, or feel faint.  You have severe chest or abdominal pain.  You vomit repeatedly.  You have trouble breathing. MAKE SURE YOU:   Understand these instructions.  Will watch your condition.  Will get help right away if you are not doing well or get worse. Document Released: 06/29/2005 Document Revised: 11/13/2013 Document Reviewed: 04/21/2013 Exit[employer] Patient Information 2015 [employer]. This information is not intended to replace advice given to you by your health care provider. Make sure you discuss any questions you have with your health care provider.  

## 2014-10-03 ENCOUNTER — Emergency Department (HOSPITAL_BASED_OUTPATIENT_CLINIC_OR_DEPARTMENT_OTHER): Payer: Medicare Other

## 2014-10-03 ENCOUNTER — Emergency Department (HOSPITAL_BASED_OUTPATIENT_CLINIC_OR_DEPARTMENT_OTHER)
Admission: EM | Admit: 2014-10-03 | Discharge: 2014-10-03 | Disposition: A | Discharge status: Home / Self Care | Payer: Medicare Other | Attending: Emergency Medicine | Admitting: Emergency Medicine

## 2014-10-03 ENCOUNTER — Encounter (HOSPITAL_BASED_OUTPATIENT_CLINIC_OR_DEPARTMENT_OTHER): Payer: Self-pay

## 2014-10-03 DIAGNOSIS — I10 Essential (primary) hypertension: Secondary | ICD-10-CM | POA: Diagnosis not present

## 2014-10-03 DIAGNOSIS — Z79899 Other long term (current) drug therapy: Secondary | ICD-10-CM | POA: Insufficient documentation

## 2014-10-03 DIAGNOSIS — Z9049 Acquired absence of other specified parts of digestive tract: Secondary | ICD-10-CM | POA: Diagnosis not present

## 2014-10-03 DIAGNOSIS — F1110 Opioid abuse, uncomplicated: Secondary | ICD-10-CM | POA: Insufficient documentation

## 2014-10-03 DIAGNOSIS — R079 Chest pain, unspecified: Secondary | ICD-10-CM | POA: Diagnosis not present

## 2014-10-03 DIAGNOSIS — Z8619 Personal history of other infectious and parasitic diseases: Secondary | ICD-10-CM | POA: Diagnosis not present

## 2014-10-03 DIAGNOSIS — R1010 Upper abdominal pain, unspecified: Secondary | ICD-10-CM | POA: Diagnosis present

## 2014-10-03 DIAGNOSIS — Z720 Tobacco use: Secondary | ICD-10-CM | POA: Diagnosis not present

## 2014-10-03 DIAGNOSIS — Z792 Long term (current) use of antibiotics: Secondary | ICD-10-CM | POA: Insufficient documentation

## 2014-10-03 DIAGNOSIS — R1013 Epigastric pain: Secondary | ICD-10-CM | POA: Insufficient documentation

## 2014-10-03 DIAGNOSIS — Z90710 Acquired absence of both cervix and uterus: Secondary | ICD-10-CM | POA: Insufficient documentation

## 2014-10-03 DIAGNOSIS — F329 Major depressive disorder, single episode, unspecified: Secondary | ICD-10-CM | POA: Diagnosis not present

## 2014-10-03 DIAGNOSIS — R109 Unspecified abdominal pain: Secondary | ICD-10-CM

## 2014-10-03 HISTORY — DX: Unspecified viral hepatitis C without hepatic coma: B19.20

## 2014-10-03 HISTORY — DX: Opioid dependence, uncomplicated: F11.20

## 2014-10-03 LAB — CBC WITH DIFFERENTIAL/PLATELET
Basophils Absolute: 0 10*3/uL (ref 0.0–0.1)
Basophils Relative: 1 % (ref 0–1)
Eosinophils Absolute: 0.1 10*3/uL (ref 0.0–0.7)
Eosinophils Relative: 2 % (ref 0–5)
HCT: 32.8 % — ABNORMAL LOW (ref 36.0–46.0)
Hemoglobin: 10.2 g/dL — ABNORMAL LOW (ref 12.0–15.0)
Lymphocytes Relative: 36 % (ref 12–46)
Lymphs Abs: 2.8 10*3/uL (ref 0.7–4.0)
MCH: 26.6 pg (ref 26.0–34.0)
MCHC: 31.1 g/dL (ref 30.0–36.0)
MCV: 85.4 fL (ref 78.0–100.0)
Monocytes Absolute: 0.6 10*3/uL (ref 0.1–1.0)
Monocytes Relative: 7 % (ref 3–12)
Neutro Abs: 4.2 10*3/uL (ref 1.7–7.7)
Neutrophils Relative %: 54 % (ref 43–77)
Platelets: 368 10*3/uL (ref 150–400)
RBC: 3.84 MIL/uL — ABNORMAL LOW (ref 3.87–5.11)
RDW: 16.1 % — ABNORMAL HIGH (ref 11.5–15.5)
WBC: 7.7 10*3/uL (ref 4.0–10.5)

## 2014-10-03 LAB — COMPREHENSIVE METABOLIC PANEL
ALT: 18 U/L (ref 0–35)
AST: 25 U/L (ref 0–37)
Albumin: 3.3 g/dL — ABNORMAL LOW (ref 3.5–5.2)
Alkaline Phosphatase: 119 U/L — ABNORMAL HIGH (ref 39–117)
Anion gap: 8 (ref 5–15)
BUN: 29 mg/dL — ABNORMAL HIGH (ref 6–23)
CO2: 23 mmol/L (ref 19–32)
Calcium: 9.2 mg/dL (ref 8.4–10.5)
Chloride: 108 mmol/L (ref 96–112)
Creatinine, Ser: 1.39 mg/dL — ABNORMAL HIGH (ref 0.50–1.10)
GFR calc Af Amer: 47 mL/min — ABNORMAL LOW (ref >90)
GFR calc non Af Amer: 41 mL/min — ABNORMAL LOW (ref >90)
Glucose, Bld: 90 mg/dL (ref 70–99)
Potassium: 4.3 mmol/L (ref 3.5–5.1)
Sodium: 139 mmol/L (ref 135–145)
Total Bilirubin: 0.3 mg/dL (ref 0.3–1.2)
Total Protein: 7.2 g/dL (ref 6.0–8.3)

## 2014-10-03 LAB — LIPASE, BLOOD: Lipase: 62 U/L — ABNORMAL HIGH (ref 11–59)

## 2014-10-03 LAB — TROPONIN I: Troponin I: <0.03 ng/mL (ref <0.031)

## 2014-10-03 MED ORDER — SUCRALFATE 1 GM/10ML PO SUSP: 1.0000 g | Freq: Three times a day (TID) | ORAL | Status: DC

## 2014-10-03 MED ORDER — PANTOPRAZOLE SODIUM 20 MG PO TBEC: 20.0000 mg | DELAYED_RELEASE_TABLET | Freq: Every day | ORAL | Status: DC

## 2014-10-03 NOTE — ED Provider Notes (Signed)
CSN: [id number]     Arrival date & time 10/03/14  1038 History   First MD Initiated Contact with Patient 10/03/14 1055     Chief Complaint  Patient presents with  . Abdominal Pain     (Consider location/radiation/quality/duration/timing/severity/associated sxs/prior Treatment) HPI Comments: Patient presents to the ER for evaluation of abdominal and chest pain. Patient reports that she has a burning sensation in her upper abdomen and up into the left side of her chest. Symptoms began yesterday and has been continuous since they began. She did take Pepcid earlier without improvement. Patient is not expressing any shortness of breath. There has not been any vomiting or diarrhea.  Patient is a 59 y.o. female presenting with abdominal pain.  Abdominal Pain Associated symptoms: chest pain     Past Medical History  Diagnosis Date  . Hypertension   . Depression (emotion)   . Hepatitis C   . Opioid dependence     in [facility]   Past Surgical History  Procedure Laterality Date  . Back surgery    . Cholecystectomy    . Myomectomy    . Abdominal hysterectomy     No family history on file. History  Substance Use Topics  . Smoking status: Current Every Day Smoker -- 1.00 packs/day    Types: Cigarettes  . Smokeless tobacco: Never Used  . Alcohol Use: No   OB History    No data available     Review of Systems  Cardiovascular: Positive for chest pain.  Gastrointestinal: Positive for abdominal pain.  All other systems reviewed and are negative.     Allergies  Review of patient's allergies indicates no known allergies.  Home Medications   Prior to Admission medications   Medication Sig Start Date End Date Taking? Authorizing Provider  ARIPiprazole (ABILIFY) 10 MG tablet Take 10 mg by mouth daily.    Historical Provider, MD  azithromycin (ZITHROMAX Z-PAK) 250 MG tablet 2 by mouth daily x1 then 1 by mouth daily x4 days 09/22/14   Lacretia Leigh, MD  FLUoxetine (PROZAC) 20 MG  capsule Take 20 mg by mouth daily.    Historical Provider, MD  hydrochlorothiazide (HYDRODIURIL) 25 MG tablet Take 25 mg by mouth daily.    Historical Provider, MD  labetalol (NORMODYNE) 100 MG tablet Take 1 tablet (100 mg total) by mouth 2 (two) times daily. 09/22/14   Lacretia Leigh, MD  traZODone (DESYREL) 100 MG tablet Take 100 mg by mouth at bedtime.    Historical Provider, MD  zolpidem (AMBIEN) 10 MG tablet Take 10 mg by mouth at bedtime as needed.    Historical Provider, MD   BP 155/87 mmHg  Pulse 68  Temp(Src) 98 F (36.7 C) (Oral)  Resp 16  Ht 5\' 2"  (1.575 m)  Wt 240 lb (108.863 kg)  BMI 43.89 kg/m2  SpO2 100% Physical Exam  Constitutional: She is oriented to person, place, and time. She appears well-developed and well-nourished. No distress.  HENT:  Head: Normocephalic and atraumatic.  Right Ear: Hearing normal.  Left Ear: Hearing normal.  Nose: Nose normal.  Mouth/Throat: Oropharynx is clear and moist and mucous membranes are normal.  Eyes: Conjunctivae and EOM are normal. Pupils are equal, round, and reactive to light.  Neck: Normal range of motion. Neck supple.  Cardiovascular: Regular rhythm, S1 normal and S2 normal.  Exam reveals no gallop and no friction rub.   No murmur heard. Pulmonary/Chest: Effort normal and breath sounds normal. No respiratory distress. She exhibits tenderness.  Abdominal: Soft. Normal appearance and bowel sounds are normal. There is no hepatosplenomegaly. There is tenderness in the epigastric area. There is no rebound, no guarding, no tenderness at McBurney's point and negative Murphy's sign. No hernia.  Musculoskeletal: Normal range of motion.  Neurological: She is alert and oriented to person, place, and time. She has normal strength. No cranial nerve deficit or sensory deficit. Coordination normal. GCS eye subscore is 4. GCS verbal subscore is 5. GCS motor subscore is 6.  Skin: Skin is warm, dry and intact. No rash noted. No cyanosis.    Psychiatric: She has a normal mood and affect. Her speech is normal and behavior is normal. Thought content normal.  Nursing note and vitals reviewed.   ED Course  Procedures (including critical care time) Labs Review Labs Reviewed  CBC WITH DIFFERENTIAL/PLATELET - Abnormal; Notable for the following:    RBC 3.84 (*)    Hemoglobin 10.2 (*)    HCT 32.8 (*)    RDW 16.1 (*)    All other components within normal limits  COMPREHENSIVE METABOLIC PANEL - Abnormal; Notable for the following:    BUN 29 (*)    Creatinine, Ser 1.39 (*)    Albumin 3.3 (*)    Alkaline Phosphatase 119 (*)    GFR calc non Af Amer 41 (*)    GFR calc Af Amer 47 (*)    All other components within normal limits  LIPASE, BLOOD - Abnormal; Notable for the following:    Lipase 62 (*)    All other components within normal limits  TROPONIN I    Imaging Review Dg Abd Acute W/chest  10/03/2014   CLINICAL DATA:  Abdominal pain  EXAM: ACUTE ABDOMEN SERIES (ABDOMEN 2 VIEW & CHEST 1 VIEW)  COMPARISON:  None currently available  FINDINGS: Normal heart size and mediastinal contours. No acute infiltrate or edema. No effusion or pneumothorax. No acute osseous findings. Right axillary or breast clips and 2 level cervical discectomy noted.  No evidence of bowel obstruction or perforation. Cholecystectomy changes. There is no concerning intra-abdominal mass effect or calcification. No pneumoperitoneum.  IMPRESSION: Negative abdominal radiographs.  No acute cardiopulmonary disease.   Electronically Signed   By: Monte Fantasia M.D.   On: 10/03/2014 12:01     EKG Interpretation   Date/Time:  Wednesday October 03 2014 11:24:06 EDT Ventricular Rate:  68 PR Interval:  140 QRS Duration: 86 QT Interval:  418 QTC Calculation: 444 R Axis:   45 Text Interpretation:  Normal sinus rhythm with sinus arrhythmia Normal ECG  Confirmed by POLLINA  MD, CHRISTOPHER ([id number]) on 10/03/2014 11:26:09 AM      MDM   Final diagnoses:  Abdominal  pain    Patient presents to the ER for evaluation of abdominal pain and chest pain. Patient is currently in detox for opiate abuse. She is complaining of pain in the epigastric area. There is mild tenderness in the epigastric region without stents peritonitis. She also has reproducible chest pain over the sternum. She has had previous cholecystectomy. There is no melena. She does not have any vomiting or diarrhea. Lab work is entirely unremarkable. Cardiac workup was negative. X-ray of the abdomen and chest negative. Will discharge with symptomatic treatment for gastritis.    Orpah Greek, MD 10/03/14 [phone]

## 2014-10-03 NOTE — Discharge Instructions (Signed)

## 2014-10-03 NOTE — ED Notes (Signed)
Pt from [facility] for detox from opiates. Pt c/o pain to epigastric area. Sts that she has burning, throbbing sensation that won't go away. Reports taking pepcid this AM with no relief.

## 2014-10-12 ENCOUNTER — Emergency Department (HOSPITAL_BASED_OUTPATIENT_CLINIC_OR_DEPARTMENT_OTHER)
Admission: EM | Admit: 2014-10-12 | Discharge: 2014-10-12 | Disposition: A | Discharge status: Home / Self Care | Payer: Medicare Other | Attending: Emergency Medicine | Admitting: Emergency Medicine

## 2014-10-12 ENCOUNTER — Encounter (HOSPITAL_BASED_OUTPATIENT_CLINIC_OR_DEPARTMENT_OTHER): Payer: Self-pay

## 2014-10-12 DIAGNOSIS — R1013 Epigastric pain: Secondary | ICD-10-CM | POA: Diagnosis not present

## 2014-10-12 DIAGNOSIS — Z8619 Personal history of other infectious and parasitic diseases: Secondary | ICD-10-CM | POA: Insufficient documentation

## 2014-10-12 DIAGNOSIS — Z9049 Acquired absence of other specified parts of digestive tract: Secondary | ICD-10-CM | POA: Diagnosis not present

## 2014-10-12 DIAGNOSIS — Z90710 Acquired absence of both cervix and uterus: Secondary | ICD-10-CM | POA: Insufficient documentation

## 2014-10-12 DIAGNOSIS — Z792 Long term (current) use of antibiotics: Secondary | ICD-10-CM | POA: Diagnosis not present

## 2014-10-12 DIAGNOSIS — F329 Major depressive disorder, single episode, unspecified: Secondary | ICD-10-CM | POA: Diagnosis not present

## 2014-10-12 DIAGNOSIS — I10 Essential (primary) hypertension: Secondary | ICD-10-CM | POA: Diagnosis not present

## 2014-10-12 DIAGNOSIS — Z720 Tobacco use: Secondary | ICD-10-CM | POA: Diagnosis not present

## 2014-10-12 DIAGNOSIS — Z79899 Other long term (current) drug therapy: Secondary | ICD-10-CM | POA: Diagnosis not present

## 2014-10-12 DIAGNOSIS — Z760 Encounter for issue of repeat prescription: Secondary | ICD-10-CM | POA: Diagnosis present

## 2014-10-12 MED ORDER — SUCRALFATE 1 G PO TABS: 1.0000 g | ORAL_TABLET | Freq: Three times a day (TID) | ORAL | Status: DC

## 2014-10-12 MED ORDER — OMEPRAZOLE 20 MG PO CPDR: 20.0000 mg | DELAYED_RELEASE_CAPSULE | Freq: Every day | ORAL | Status: DC

## 2014-10-12 MED ORDER — AMOXICILLIN 500 MG PO CAPS: 1000.0000 mg | ORAL_CAPSULE | Freq: Two times a day (BID) | ORAL | Status: DC

## 2014-10-12 MED ORDER — CLARITHROMYCIN 500 MG PO TABS: 500.0000 mg | ORAL_TABLET | Freq: Two times a day (BID) | ORAL | Status: DC

## 2014-10-12 NOTE — Discharge Instructions (Signed)

## 2014-10-12 NOTE — ED Notes (Signed)
Pt requests I call [facility] for ride.  Pt asks questions about medications then verbalizes understanding of D/C instructions

## 2014-10-12 NOTE — ED Provider Notes (Signed)
CSN: [id number]     Arrival date & time 10/12/14  [id number] History   First MD Initiated Contact with Patient 10/12/14 [phone]     Chief Complaint  Patient presents with  . Medication Refill     (Consider location/radiation/quality/duration/timing/severity/associated sxs/prior Treatment) Patient is a 59 y.o. female presenting with abdominal pain.  Abdominal Pain Pain location:  Epigastric Pain quality: sharp   Pain radiates to:  Does not radiate Pain severity:  Mild Onset quality:  Gradual Timing:  Constant Progression:  Unchanged Chronicity:  New Context: recent illness (seen here with gastritis)   Context: not alcohol use and not medication withdrawal   Relieved by:  Nothing Worsened by:  Nothing tried Ineffective treatments:  None tried Associated symptoms: no anorexia, no dysuria, no fever, no nausea and no vomiting     Past Medical History  Diagnosis Date  . Hypertension   . Depression (emotion)   . Hepatitis C   . Opioid dependence     in [facility]   Past Surgical History  Procedure Laterality Date  . Back surgery    . Cholecystectomy    . Myomectomy    . Abdominal hysterectomy     No family history on file. History  Substance Use Topics  . Smoking status: Current Every Day Smoker -- 1.00 packs/day    Types: Cigarettes  . Smokeless tobacco: Never Used  . Alcohol Use: No   OB History    No data available     Review of Systems  Constitutional: Negative for fever.  Gastrointestinal: Positive for abdominal pain. Negative for nausea, vomiting and anorexia.  Genitourinary: Negative for dysuria.  All other systems reviewed and are negative.     Allergies  Review of patient's allergies indicates no known allergies.  Home Medications   Prior to Admission medications   Medication Sig Start Date End Date Taking? Authorizing Provider  amoxicillin (AMOXIL) 500 MG capsule Take 2 capsules (1,000 mg total) by mouth 2 (two) times daily. 10/12/14   Debby Freiberg,  MD  ARIPiprazole (ABILIFY) 10 MG tablet Take 10 mg by mouth daily.    Historical Provider, MD  azithromycin (ZITHROMAX Z-PAK) 250 MG tablet 2 by mouth daily x1 then 1 by mouth daily x4 days 09/22/14   Lacretia Leigh, MD  clarithromycin (BIAXIN) 500 MG tablet Take 1 tablet (500 mg total) by mouth 2 (two) times daily. 10/12/14   Debby Freiberg, MD  FLUoxetine (PROZAC) 20 MG capsule Take 20 mg by mouth daily.    Historical Provider, MD  hydrochlorothiazide (HYDRODIURIL) 25 MG tablet Take 25 mg by mouth daily.    Historical Provider, MD  labetalol (NORMODYNE) 100 MG tablet Take 1 tablet (100 mg total) by mouth 2 (two) times daily. 09/22/14   Lacretia Leigh, MD  omeprazole (PRILOSEC) 20 MG capsule Take 1 capsule (20 mg total) by mouth daily. 10/12/14   Debby Freiberg, MD  pantoprazole (PROTONIX) 20 MG tablet Take 1 tablet (20 mg total) by mouth daily. 10/03/14   Orpah Greek, MD  sucralfate (CARAFATE) 1 G tablet Take 1 tablet (1 g total) by mouth 4 (four) times daily -  with meals and at bedtime. 10/12/14   Debby Freiberg, MD  traZODone (DESYREL) 100 MG tablet Take 100 mg by mouth at bedtime.    Historical Provider, MD  zolpidem (AMBIEN) 10 MG tablet Take 10 mg by mouth at bedtime as needed.    Historical Provider, MD   BP 156/87 mmHg  Pulse 78  Temp(Src)  97.8 F (36.6 C) (Oral)  Resp 18  Ht 5\' 2"  (1.575 m)  Wt 221 lb (100.245 kg)  BMI 40.41 kg/m2  SpO2 100% Physical Exam  Constitutional: She is oriented to person, place, and time. She appears well-developed and well-nourished.  HENT:  Head: Normocephalic and atraumatic.  Right Ear: External ear normal.  Left Ear: External ear normal.  Eyes: Conjunctivae and EOM are normal. Pupils are equal, round, and reactive to light.  Neck: Normal range of motion. Neck supple.  Cardiovascular: Normal rate, regular rhythm, normal heart sounds and intact distal pulses.   Pulmonary/Chest: Effort normal and breath sounds normal.  Abdominal: Soft. Bowel  sounds are normal. There is tenderness in the epigastric area.  Musculoskeletal: Normal range of motion.  Neurological: She is alert and oriented to person, place, and time.  Skin: Skin is warm and dry.  Vitals reviewed.   ED Course  Procedures (including critical care time) Labs Review Labs Reviewed - No data to display  Imaging Review No results found.   EKG Interpretation None      MDM   Final diagnoses:  Abdominal pain, epigastric    59 y.o. female with pertinent PMH of recent visit for abd pain with unremarkable wu presents with continued pain after running out of carafate, with primary reason for visit being for medication refill from [facility].  Pt denies change in symptoms, appears comfortable, and has benign exam with exception of very mild epigastric tenderness.  Given continued pain, will treat empirically for PUD, as well as refill carafate.  I stressed to the pt that she needs to fu with a PCP for her fu.    I have reviewed all laboratory and imaging studies if ordered as above  1. Abdominal pain, epigastric         Debby Freiberg, MD 10/12/14 [phone]

## 2014-10-12 NOTE — ED Notes (Signed)
MD at bedside. 

## 2014-10-12 NOTE — ED Notes (Signed)
Reports having continued pain. sts she is at [facility] and out of carafate. Sts she "needs something else because that's too expensive."

## 2015-02-17 ENCOUNTER — Encounter (HOSPITAL_COMMUNITY): Payer: Self-pay | Admitting: Emergency Medicine

## 2015-02-17 ENCOUNTER — Emergency Department (HOSPITAL_COMMUNITY)
Admission: EM | Admit: 2015-02-17 | Discharge: 2015-02-17 | Disposition: A | Discharge status: Home / Self Care | Payer: Medicare Other | Attending: Emergency Medicine | Admitting: Emergency Medicine

## 2015-02-17 DIAGNOSIS — I10 Essential (primary) hypertension: Secondary | ICD-10-CM | POA: Insufficient documentation

## 2015-02-17 DIAGNOSIS — Z792 Long term (current) use of antibiotics: Secondary | ICD-10-CM | POA: Diagnosis not present

## 2015-02-17 DIAGNOSIS — Y998 Other external cause status: Secondary | ICD-10-CM | POA: Diagnosis not present

## 2015-02-17 DIAGNOSIS — Z8619 Personal history of other infectious and parasitic diseases: Secondary | ICD-10-CM | POA: Diagnosis not present

## 2015-02-17 DIAGNOSIS — Z79899 Other long term (current) drug therapy: Secondary | ICD-10-CM | POA: Insufficient documentation

## 2015-02-17 DIAGNOSIS — W010XXA Fall on same level from slipping, tripping and stumbling without subsequent striking against object, initial encounter: Secondary | ICD-10-CM | POA: Insufficient documentation

## 2015-02-17 DIAGNOSIS — Y9389 Activity, other specified: Secondary | ICD-10-CM | POA: Diagnosis not present

## 2015-02-17 DIAGNOSIS — F329 Major depressive disorder, single episode, unspecified: Secondary | ICD-10-CM | POA: Insufficient documentation

## 2015-02-17 DIAGNOSIS — T07XXXA Unspecified multiple injuries, initial encounter: Secondary | ICD-10-CM

## 2015-02-17 DIAGNOSIS — Z720 Tobacco use: Secondary | ICD-10-CM | POA: Diagnosis not present

## 2015-02-17 DIAGNOSIS — S3992XA Unspecified injury of lower back, initial encounter: Secondary | ICD-10-CM | POA: Diagnosis present

## 2015-02-17 DIAGNOSIS — Y9289 Other specified places as the place of occurrence of the external cause: Secondary | ICD-10-CM | POA: Diagnosis not present

## 2015-02-17 MED ORDER — HYDROCODONE-ACETAMINOPHEN 5-325 MG PO TABS: 2.0000 | ORAL_TABLET | ORAL | Status: DC | PRN

## 2015-02-17 MED ORDER — METHOCARBAMOL 500 MG PO TABS
1000.0000 mg | ORAL_TABLET | Freq: Once | ORAL | Status: AC
  Administered 2015-02-17: 1000 mg via ORAL
  Filled 2015-02-17: qty 2

## 2015-02-17 MED ORDER — METHOCARBAMOL 500 MG PO TABS
500.0000 mg | ORAL_TABLET | Freq: Three times a day (TID) | ORAL | Status: DC | PRN
Start: 2015-02-17 — End: 2015-04-14

## 2015-02-17 MED ORDER — IBUPROFEN 800 MG PO TABS
800.0000 mg | ORAL_TABLET | Freq: Once | ORAL | Status: AC
  Administered 2015-02-17: 800 mg via ORAL
  Filled 2015-02-17: qty 1

## 2015-02-17 MED ORDER — NAPROXEN 500 MG PO TABS: 500.0000 mg | ORAL_TABLET | Freq: Two times a day (BID) | ORAL | Status: DC

## 2015-02-17 NOTE — Discharge Instructions (Signed)
Contusion °A contusion is a deep bruise. Contusions happen when an injury causes bleeding under the skin. Signs of bruising include pain, puffiness (swelling), and discolored skin. The contusion may turn blue, purple, or yellow. °HOME CARE  °· Put ice on the injured area. °¨ Put ice in a plastic bag. °¨ Place a towel between your skin and the bag. °¨ Leave the ice on for 15-20 minutes, 03-04 times a day. °· Only take medicine as told by your doctor. °· Rest the injured area. °· If possible, raise (elevate) the injured area to lessen puffiness. °GET HELP RIGHT AWAY IF:  °· You have more bruising or puffiness. °· You have pain that is getting worse. °· Your puffiness or pain is not helped by medicine. °MAKE SURE YOU:  °· Understand these instructions. °· Will watch your condition. °· Will get help right away if you are not doing well or get worse. °Document Released: 12/16/2007 Document Revised: 09/21/2011 Document Reviewed: 05/04/2011 °ExitCare® Patient Information ©2015 [employer]. This information is not intended to replace advice given to you by your health care provider. Make sure you discuss any questions you have with your health care provider. ° °

## 2015-02-17 NOTE — ED Notes (Signed)
Pt reported lower back pain s/p to slipping down steps. Denies changes in urinary/bowel patterns, LOC, hitting head, visual disturbances, lightheadedness/dizziness, or headaches. Pt ambulated to triage area with steady gait. (+)PMS, CRT brisk, no deformity/swelling of BLE noted.

## 2015-02-17 NOTE — ED Provider Notes (Signed)
CSN: [id number]     Arrival date & time 02/17/15  1713 History   First MD Initiated Contact with Patient 02/17/15 1737     Chief Complaint  Patient presents with  . Back Pain  . Hypertension      HPI  His reevaluation after a fall 2 days ago. She slipped on the bottom one or 2 steps. States she landed "flat on her back". States she landed across the floor not the steps and has had some pain in her right lower paralumbar spine since that time. Ambulatory.. No numbness weakness tingling.  Did not strike the head. No midline neck or back pain  Past Medical History  Diagnosis Date  . Hypertension   . Depression (emotion)   . Hepatitis C   . Opioid dependence     in [facility]   Past Surgical History  Procedure Laterality Date  . Back surgery    . Cholecystectomy    . Myomectomy    . Abdominal hysterectomy     Family History  Problem Relation Age of Onset  . Family history unknown: Yes   History  Substance Use Topics  . Smoking status: Current Every Day Smoker -- 1.00 packs/day    Types: Cigarettes  . Smokeless tobacco: Never Used  . Alcohol Use: No   OB History    No data available     Review of Systems  Constitutional: Negative for fever, chills, diaphoresis, appetite change and fatigue.  HENT: Negative for mouth sores, sore throat and trouble swallowing.   Eyes: Negative for visual disturbance.  Respiratory: Negative for cough, chest tightness, shortness of breath and wheezing.   Cardiovascular: Negative for chest pain.  Gastrointestinal: Negative for nausea, vomiting, abdominal pain, diarrhea and abdominal distention.  Endocrine: Negative for polydipsia, polyphagia and polyuria.  Genitourinary: Negative for dysuria, frequency and hematuria.  Musculoskeletal: Positive for back pain. Negative for gait problem.  Skin: Negative for color change, pallor and rash.  Neurological: Negative for dizziness, syncope, light-headedness and headaches.  Hematological: Does  not bruise/bleed easily.  Psychiatric/Behavioral: Negative for behavioral problems and confusion.      Allergies  Review of patient's allergies indicates no known allergies.  Home Medications   Prior to Admission medications   Medication Sig Start Date End Date Taking? Authorizing Provider  amoxicillin (AMOXIL) 500 MG capsule Take 2 capsules (1,000 mg total) by mouth 2 (two) times daily. 10/12/14   Debby Freiberg, MD  ARIPiprazole (ABILIFY) 10 MG tablet Take 10 mg by mouth daily.    Historical Provider, MD  azithromycin (ZITHROMAX Z-PAK) 250 MG tablet 2 by mouth daily x1 then 1 by mouth daily x4 days 09/22/14   Lacretia Leigh, MD  clarithromycin (BIAXIN) 500 MG tablet Take 1 tablet (500 mg total) by mouth 2 (two) times daily. 10/12/14   Debby Freiberg, MD  FLUoxetine (PROZAC) 20 MG capsule Take 20 mg by mouth daily.    Historical Provider, MD  hydrochlorothiazide (HYDRODIURIL) 25 MG tablet Take 25 mg by mouth daily.    Historical Provider, MD  HYDROcodone-acetaminophen (NORCO/VICODIN) 5-325 MG per tablet Take 2 tablets by mouth every 4 (four) hours as needed. 02/17/15   Tanna Furry, MD  labetalol (NORMODYNE) 100 MG tablet Take 1 tablet (100 mg total) by mouth 2 (two) times daily. 09/22/14   Lacretia Leigh, MD  methocarbamol (ROBAXIN) 500 MG tablet Take 1 tablet (500 mg total) by mouth 3 (three) times daily between meals as needed. 02/17/15   Tanna Furry, MD  naproxen (NAPROSYN) 500 MG tablet Take 1 tablet (500 mg total) by mouth 2 (two) times daily. 02/17/15   Tanna Furry, MD  omeprazole (PRILOSEC) 20 MG capsule Take 1 capsule (20 mg total) by mouth daily. 10/12/14   Debby Freiberg, MD  pantoprazole (PROTONIX) 20 MG tablet Take 1 tablet (20 mg total) by mouth daily. 10/03/14   Orpah Greek, MD  sucralfate (CARAFATE) 1 G tablet Take 1 tablet (1 g total) by mouth 4 (four) times daily -  with meals and at bedtime. 10/12/14   Debby Freiberg, MD  traZODone (DESYREL) 100 MG tablet Take 100 mg by mouth at  bedtime.    Historical Provider, MD  zolpidem (AMBIEN) 10 MG tablet Take 10 mg by mouth at bedtime as needed.    Historical Provider, MD   BP 206/94 mmHg  Temp(Src) 97.8 F (36.6 C) (Oral)  Resp 18  Ht 5\' 2"  (1.575 m)  Wt 225 lb (102.059 kg)  BMI 41.14 kg/m2  SpO2 100% Physical Exam  Constitutional: She is oriented to person, place, and time. She appears well-developed and well-nourished. No distress.  HENT:  Head: Normocephalic.  Eyes: Conjunctivae are normal. Pupils are equal, round, and reactive to light. No scleral icterus.  Neck: Normal range of motion. Neck supple. No thyromegaly present.  Cardiovascular: Normal rate and regular rhythm.  Exam reveals no gallop and no friction rub.   No murmur heard. Pulmonary/Chest: Effort normal and breath sounds normal. No respiratory distress. She has no wheezes. She has no rales.  Abdominal: Soft. Bowel sounds are normal. She exhibits no distension. There is no tenderness. There is no rebound.  Musculoskeletal: Normal range of motion.       Back:  Neurological: She is alert and oriented to person, place, and time.  Skin: Skin is warm and dry. No rash noted.  Psychiatric: She has a normal mood and affect. Her behavior is normal.    ED Course  Procedures (including critical care time) Labs Review Labs Reviewed - No data to display  Imaging Review No results found.   EKG Interpretation None      MDM   Final diagnoses:  Multiple contusions    No specific areas of tenderness over bony prominences. Not over the iliac crest her trochanter. Not over the midline spine or lower ribs. Anxious appropriate for outpatient treatment simple limited pain control, and laboratories, muscle accident.    Tanna Furry, MD 02/17/15 Lurena Nida

## 2015-04-14 ENCOUNTER — Encounter (HOSPITAL_BASED_OUTPATIENT_CLINIC_OR_DEPARTMENT_OTHER): Payer: Self-pay | Admitting: *Deleted

## 2015-04-14 ENCOUNTER — Emergency Department (HOSPITAL_BASED_OUTPATIENT_CLINIC_OR_DEPARTMENT_OTHER)
Admission: EM | Admit: 2015-04-14 | Discharge: 2015-04-14 | Disposition: A | Discharge status: Home / Self Care | Payer: Medicare Other | Attending: Emergency Medicine | Admitting: Emergency Medicine

## 2015-04-14 DIAGNOSIS — Z791 Long term (current) use of non-steroidal anti-inflammatories (NSAID): Secondary | ICD-10-CM | POA: Diagnosis not present

## 2015-04-14 DIAGNOSIS — Z720 Tobacco use: Secondary | ICD-10-CM | POA: Insufficient documentation

## 2015-04-14 DIAGNOSIS — Z79899 Other long term (current) drug therapy: Secondary | ICD-10-CM | POA: Diagnosis not present

## 2015-04-14 DIAGNOSIS — M545 Low back pain: Secondary | ICD-10-CM | POA: Diagnosis present

## 2015-04-14 DIAGNOSIS — I10 Essential (primary) hypertension: Secondary | ICD-10-CM | POA: Insufficient documentation

## 2015-04-14 DIAGNOSIS — M5441 Lumbago with sciatica, right side: Secondary | ICD-10-CM | POA: Insufficient documentation

## 2015-04-14 DIAGNOSIS — F329 Major depressive disorder, single episode, unspecified: Secondary | ICD-10-CM | POA: Diagnosis not present

## 2015-04-14 DIAGNOSIS — G8929 Other chronic pain: Secondary | ICD-10-CM | POA: Insufficient documentation

## 2015-04-14 DIAGNOSIS — Z8619 Personal history of other infectious and parasitic diseases: Secondary | ICD-10-CM | POA: Diagnosis not present

## 2015-04-14 DIAGNOSIS — Z792 Long term (current) use of antibiotics: Secondary | ICD-10-CM | POA: Diagnosis not present

## 2015-04-14 HISTORY — DX: Dorsalgia, unspecified: M54.9

## 2015-04-14 HISTORY — DX: Other chronic pain: G89.29

## 2015-04-14 MED ORDER — KETOROLAC TROMETHAMINE 60 MG/2ML IM SOLN
60.0000 mg | Freq: Once | INTRAMUSCULAR | Status: AC
  Administered 2015-04-14: 60 mg via INTRAMUSCULAR
  Filled 2015-04-14: qty 2

## 2015-04-14 MED ORDER — METHOCARBAMOL 500 MG PO TABS: 500.0000 mg | ORAL_TABLET | Freq: Two times a day (BID) | ORAL | Status: DC

## 2015-04-14 MED ORDER — ORPHENADRINE CITRATE 30 MG/ML IJ SOLN
30.0000 mg | Freq: Two times a day (BID) | INTRAMUSCULAR | Status: DC
  Filled 2015-04-14: qty 1

## 2015-04-14 MED ORDER — METHOCARBAMOL 500 MG PO TABS
500.0000 mg | ORAL_TABLET | Freq: Once | ORAL | Status: AC
  Administered 2015-04-14: 500 mg via ORAL
  Filled 2015-04-14: qty 1

## 2015-04-14 MED ORDER — OXYCODONE-ACETAMINOPHEN 5-325 MG PO TABS: 1.0000 | ORAL_TABLET | Freq: Three times a day (TID) | ORAL | Status: DC | PRN

## 2015-04-14 NOTE — ED Notes (Addendum)
Pt reports hx of back surgery X2 ; per pt hx removal of discs in spinal column; unaware of exact specifics of surgeries; pt reports pain management with Prednisone pack; Ibuprofen 800 and Neurontin   with no relief of back pain X3; pt reports she has had home RX of Percocet in the past  that relieved her pain

## 2015-04-14 NOTE — ED Provider Notes (Signed)
CSN: [id number]     Arrival date & time 04/14/15  1052 History   First MD Initiated Contact with Patient 04/14/15 1257     Chief Complaint  Patient presents with  . Back Pain     (Consider location/radiation/quality/duration/timing/severity/associated sxs/prior Treatment) Patient is a 59 y.o. female presenting with back pain.  Back Pain Pain location: lumbar paraspinal. Quality:  Aching Radiates to:  R posterior upper leg Pain severity:  Mild Onset quality:  Gradual Duration:  2 days Timing:  Constant Chronicity:  Recurrent Ineffective treatments:  Bed rest, cold packs and heating pad Associated symptoms: leg pain (posterior right)   Associated symptoms: no abdominal pain, no fever, no numbness, no paresthesias and no weakness     Past Medical History  Diagnosis Date  . Hypertension   . Depression (emotion)   . Hepatitis C   . Opioid dependence ([place])     in [place]  . Chronic back pain    Past Surgical History  Procedure Laterality Date  . Back surgery    . Cholecystectomy    . Myomectomy    . Abdominal hysterectomy     Family History  Problem Relation Age of Onset  . Family history unknown: Yes   Social History  Substance Use Topics  . Smoking status: Current Every Day Smoker -- 1.00 packs/day    Types: Cigarettes  . Smokeless tobacco: Never Used  . Alcohol Use: No   OB History    No data available     Review of Systems  Constitutional: Negative for fever and chills.  HENT: Negative for congestion.   Respiratory: Negative for cough.   Gastrointestinal: Negative for nausea, vomiting, abdominal pain and abdominal distention.  Musculoskeletal: Positive for back pain.  Neurological: Negative for weakness, numbness and paresthesias.  All other systems reviewed and are negative.     Allergies  Flexeril  Home Medications   Prior to Admission medications   Medication Sig Start Date End Date Taking? Authorizing Provider  FLUoxetine (PROZAC) 20  MG capsule Take 20 mg by mouth daily.   Yes Historical Provider, MD  gabapentin (NEURONTIN) 600 MG tablet Take 600 mg by mouth 3 (three) times daily.   Yes Historical Provider, MD  hydrochlorothiazide (HYDRODIURIL) 25 MG tablet Take 25 mg by mouth daily.   Yes Historical Provider, MD  hydrOXYzine (ATARAX/VISTARIL) 25 MG tablet Take 25 mg by mouth 3 (three) times daily as needed.   Yes Historical Provider, MD  labetalol (NORMODYNE) 100 MG tablet Take 1 tablet (100 mg total) by mouth 2 (two) times daily. 09/22/14  Yes Lacretia Leigh, MD  omeprazole (PRILOSEC) 20 MG capsule Take 1 capsule (20 mg total) by mouth daily. 10/12/14  Yes Debby Freiberg, MD  amoxicillin (AMOXIL) 500 MG capsule Take 2 capsules (1,000 mg total) by mouth 2 (two) times daily. 10/12/14   Debby Freiberg, MD  ARIPiprazole (ABILIFY) 10 MG tablet Take 10 mg by mouth daily.    Historical Provider, MD  azithromycin (ZITHROMAX Z-PAK) 250 MG tablet 2 by mouth daily x1 then 1 by mouth daily x4 days 09/22/14   Lacretia Leigh, MD  clarithromycin (BIAXIN) 500 MG tablet Take 1 tablet (500 mg total) by mouth 2 (two) times daily. 10/12/14   Debby Freiberg, MD  HYDROcodone-acetaminophen (NORCO/VICODIN) 5-325 MG per tablet Take 2 tablets by mouth every 4 (four) hours as needed. 02/17/15   Tanna Furry, MD  methocarbamol (ROBAXIN) 500 MG tablet Take 1 tablet (500 mg total) by mouth 2 (two)  times daily. 04/14/15   Merrily Pew, MD  naproxen (NAPROSYN) 500 MG tablet Take 1 tablet (500 mg total) by mouth 2 (two) times daily. 02/17/15   Tanna Furry, MD  oxyCODONE-acetaminophen (PERCOCET) 5-325 MG tablet Take 1 tablet by mouth every 8 (eight) hours as needed for severe pain. 04/14/15   Merrily Pew, MD  pantoprazole (PROTONIX) 20 MG tablet Take 1 tablet (20 mg total) by mouth daily. 10/03/14   Orpah Greek, MD  sucralfate (CARAFATE) 1 G tablet Take 1 tablet (1 g total) by mouth 4 (four) times daily -  with meals and at bedtime. 10/12/14   Debby Freiberg, MD   traZODone (DESYREL) 100 MG tablet Take 100 mg by mouth at bedtime.    Historical Provider, MD  zolpidem (AMBIEN) 10 MG tablet Take 10 mg by mouth at bedtime as needed.    Historical Provider, MD   BP 187/82 mmHg  Pulse 57  Temp(Src) 98.6 F (37 C) (Oral)  Resp 18  Ht 5\' 2"  (1.575 m)  Wt 225 lb (102.059 kg)  BMI 41.14 kg/m2  SpO2 100% Physical Exam  Constitutional: She is oriented to person, place, and time. She appears well-developed and well-nourished.  HENT:  Head: Normocephalic and atraumatic.  Eyes: Conjunctivae and EOM are normal. Right eye exhibits no discharge. Left eye exhibits no discharge.  Cardiovascular: Normal rate and regular rhythm.   Pulmonary/Chest: Effort normal and breath sounds normal. No respiratory distress.  Abdominal: Soft. She exhibits no distension. There is no tenderness. There is no rebound.  Musculoskeletal: Normal range of motion. She exhibits no edema or tenderness.  Neurological: She is alert and oriented to person, place, and time.  No altered mental status, able to give full seemingly accurate history.  Upper and Lower extremity motor 5/5, intact pain perception in distal extremities, 2+ reflexes in biceps, patella and achilles tendons. Finger to nose normal, heel to shin normal. Walks without assistance or evident ataxia.  Skin: Skin is warm and dry.  Nursing note and vitals reviewed.   ED Course  Procedures (including critical care time) Labs Review Labs Reviewed - No data to display  Imaging Review No results found. I have personally reviewed and evaluated these images and lab results as part of my medical decision-making.   EKG Interpretation None      MDM   Final diagnoses:  Bilateral low back pain with right-sided sciatica   In the evaluation of this patient's back pain my DDx includes, but is not necessarily limited to, cauda equina, kidney stones or pyelonephritis, AAA, fracture, tumor, osteomyelitis or epidural abscess,  herniated disk, and muscular strain. I feel her back pain is more of an acute exacerbation of chronic back pain. No red flags to suggest other dx as above. Will tx conservatively and dc to fu w/ pcp.    I have personally and contemperaneously reviewed labs and imaging and used in my decision making as above.   A medical screening exam was performed and I feel the patient has had an appropriate workup for their chief complaint at this time and likelihood of emergent condition existing is low. They have been counseled on decision, discharge, follow up and which symptoms necessitate immediate return to the emergency department. They or their family verbally stated understanding and agreement with plan and discharged in stable condition.      Merrily Pew, MD 04/15/15 1009

## 2015-04-14 NOTE — ED Notes (Signed)
Low back pain radiating down right leg x 3 days. Hx of chronic back pain

## 2015-04-14 NOTE — ED Notes (Signed)
Pt teaching done re: safety while taking pain med and resting area of pain

## 2015-05-01 ENCOUNTER — Encounter (HOSPITAL_COMMUNITY): Payer: Self-pay

## 2015-05-01 ENCOUNTER — Emergency Department (HOSPITAL_COMMUNITY)
Admission: EM | Admit: 2015-05-01 | Discharge: 2015-05-01 | Disposition: A | Discharge status: Home / Self Care | Payer: Medicare Other | Attending: Emergency Medicine | Admitting: Emergency Medicine

## 2015-05-01 DIAGNOSIS — Z791 Long term (current) use of non-steroidal anti-inflammatories (NSAID): Secondary | ICD-10-CM | POA: Insufficient documentation

## 2015-05-01 DIAGNOSIS — Z8619 Personal history of other infectious and parasitic diseases: Secondary | ICD-10-CM | POA: Diagnosis not present

## 2015-05-01 DIAGNOSIS — I10 Essential (primary) hypertension: Secondary | ICD-10-CM | POA: Diagnosis not present

## 2015-05-01 DIAGNOSIS — Z79899 Other long term (current) drug therapy: Secondary | ICD-10-CM | POA: Insufficient documentation

## 2015-05-01 DIAGNOSIS — Z720 Tobacco use: Secondary | ICD-10-CM | POA: Diagnosis not present

## 2015-05-01 DIAGNOSIS — G8929 Other chronic pain: Secondary | ICD-10-CM | POA: Diagnosis not present

## 2015-05-01 DIAGNOSIS — M545 Low back pain, unspecified: Secondary | ICD-10-CM

## 2015-05-01 DIAGNOSIS — Z792 Long term (current) use of antibiotics: Secondary | ICD-10-CM | POA: Insufficient documentation

## 2015-05-01 DIAGNOSIS — F329 Major depressive disorder, single episode, unspecified: Secondary | ICD-10-CM | POA: Diagnosis not present

## 2015-05-01 DIAGNOSIS — G2581 Restless legs syndrome: Secondary | ICD-10-CM | POA: Diagnosis not present

## 2015-05-01 DIAGNOSIS — Z9889 Other specified postprocedural states: Secondary | ICD-10-CM | POA: Insufficient documentation

## 2015-05-01 MED ORDER — TRAMADOL HCL 50 MG PO TABS: 50.0000 mg | ORAL_TABLET | Freq: Four times a day (QID) | ORAL | Status: DC | PRN

## 2015-05-01 NOTE — Discharge Instructions (Signed)
Please read and follow all provided instructions.  Your diagnoses today include:  1. Bilateral low back pain without sciatica    Tests performed today include:  Vital signs - see below for your results today  Medications prescribed:   Tramadol - narcotic-like pain medication  DO NOT drive or perform any activities that require you to be awake and alert because this medicine can make you drowsy.   Take any prescribed medications only as directed.  Home care instructions:   Follow any educational materials contained in this packet  Please rest, use ice or heat on your back for the next several days  Do not lift, push, pull anything more than 10 pounds for the next week  Follow-up instructions: Please follow-up with your primary care provider in the next 1 week for further evaluation of your symptoms.   Return instructions:  SEEK IMMEDIATE MEDICAL ATTENTION IF YOU HAVE:  New numbness, tingling, weakness, or problem with the use of your arms or legs  Severe back pain not relieved with medications  Loss control of your bowels or bladder  Increasing pain in any areas of the body (such as chest or abdominal pain)  Shortness of breath, dizziness, or fainting.   Worsening nausea (feeling sick to your stomach), vomiting, fever, or sweats  Any other emergent concerns regarding your health   Additional Information:  Your vital signs today were: BP 151/80 mmHg   Pulse 68   Temp(Src) 98.1 F (36.7 C) (Oral)   Resp 20   SpO2 100% If your blood pressure (BP) was elevated above 135/85 this visit, please have this repeated by your doctor within one month. --------------

## 2015-05-01 NOTE — ED Notes (Addendum)
Pt c/o lower back pain increasing at night and BLE "throbbing" at night x over a week.  Pain score 9/10.  Denies injury.  Pt reports taking Neurontin and Robaxin w/o relief.  Pt currently does not have a PCP, but has an appointment w/ a new provider on 11/14.  Hx of chronic pain and restless leg syndrome.

## 2015-05-01 NOTE — ED Provider Notes (Signed)
CSN: [id number]     Arrival date & time 05/01/15  1444 History  By signing my name below, I, Rayna Sexton, attest that this documentation has been prepared under the direction and in the presence of Carlisle Cater, PA-C. Electronically Signed: Rayna Sexton, ED Scribe. 05/01/2015. 3:32 PM.   Chief Complaint  Patient presents with  . Back Pain   The history is provided by the patient. No language interpreter was used.    HPI Comments: Sherry [name] is a 59 y.o. female, with a hx of restless leg syndrome and chronic back pain, who presents to the Emergency Department complaining of constant, moderate, lower centralized back pain with onset 1 week ago. Pt notes that her pain worsens at night when lying down. Pt notes taking Neurontin, robaxin and Aleve which have provided little relief. Pt notes having an appointment with her new PCP on 11/14. Pt notes a hx of L5/S1 disk removal (per patient) as well as receiving spinal injections about 10 years ago. Pt has been given 16 rx's from multiple providers for controlled substances in the past 6 months. She denies incontinence of her bowels or bladder, fevers, sudden weight loss, numbness or tingling.   Past Medical History  Diagnosis Date  . Hypertension   . Depression (emotion)   . Hepatitis C   . Opioid dependence ([place])     in [place]  . Chronic back pain    Past Surgical History  Procedure Laterality Date  . Back surgery    . Cholecystectomy    . Myomectomy    . Abdominal hysterectomy     Family History  Problem Relation Age of Onset  . Family history unknown: Yes   Social History  Substance Use Topics  . Smoking status: Current Every Day Smoker -- 1.00 packs/day    Types: Cigarettes  . Smokeless tobacco: Never Used  . Alcohol Use: No   OB History    No data available     Review of Systems  Constitutional: Negative for fever and unexpected weight change.  Gastrointestinal: Negative for constipation.       Negative  for fecal incontinence.   Genitourinary: Negative for dysuria, hematuria, flank pain, vaginal bleeding, vaginal discharge and pelvic pain.       Negative for urinary incontinence or retention.  Musculoskeletal: Positive for myalgias and back pain.  Skin: Negative for wound.  Neurological: Negative for weakness and numbness.       Denies saddle paresthesias.   Allergies  Flexeril  Home Medications   Prior to Admission medications   Medication Sig Start Date End Date Taking? Authorizing Provider  amoxicillin (AMOXIL) 500 MG capsule Take 2 capsules (1,000 mg total) by mouth 2 (two) times daily. 10/12/14   Debby Freiberg, MD  ARIPiprazole (ABILIFY) 10 MG tablet Take 10 mg by mouth daily.    Historical Provider, MD  azithromycin (ZITHROMAX Z-PAK) 250 MG tablet 2 by mouth daily x1 then 1 by mouth daily x4 days 09/22/14   Lacretia Leigh, MD  clarithromycin (BIAXIN) 500 MG tablet Take 1 tablet (500 mg total) by mouth 2 (two) times daily. 10/12/14   Debby Freiberg, MD  FLUoxetine (PROZAC) 20 MG capsule Take 20 mg by mouth daily.    Historical Provider, MD  gabapentin (NEURONTIN) 600 MG tablet Take 600 mg by mouth 3 (three) times daily.    Historical Provider, MD  hydrochlorothiazide (HYDRODIURIL) 25 MG tablet Take 25 mg by mouth daily.    Historical Provider, MD  HYDROcodone-acetaminophen (  NORCO/VICODIN) 5-325 MG per tablet Take 2 tablets by mouth every 4 (four) hours as needed. 02/17/15   Tanna Furry, MD  hydrOXYzine (ATARAX/VISTARIL) 25 MG tablet Take 25 mg by mouth 3 (three) times daily as needed.    Historical Provider, MD  labetalol (NORMODYNE) 100 MG tablet Take 1 tablet (100 mg total) by mouth 2 (two) times daily. 09/22/14   Lacretia Leigh, MD  methocarbamol (ROBAXIN) 500 MG tablet Take 1 tablet (500 mg total) by mouth 2 (two) times daily. 04/14/15   Merrily Pew, MD  naproxen (NAPROSYN) 500 MG tablet Take 1 tablet (500 mg total) by mouth 2 (two) times daily. 02/17/15   Tanna Furry, MD  omeprazole  (PRILOSEC) 20 MG capsule Take 1 capsule (20 mg total) by mouth daily. 10/12/14   Debby Freiberg, MD  oxyCODONE-acetaminophen (PERCOCET) 5-325 MG tablet Take 1 tablet by mouth every 8 (eight) hours as needed for severe pain. 04/14/15   Merrily Pew, MD  pantoprazole (PROTONIX) 20 MG tablet Take 1 tablet (20 mg total) by mouth daily. 10/03/14   Orpah Greek, MD  sucralfate (CARAFATE) 1 G tablet Take 1 tablet (1 g total) by mouth 4 (four) times daily -  with meals and at bedtime. 10/12/14   Debby Freiberg, MD  traZODone (DESYREL) 100 MG tablet Take 100 mg by mouth at bedtime.    Historical Provider, MD  zolpidem (AMBIEN) 10 MG tablet Take 10 mg by mouth at bedtime as needed.    Historical Provider, MD   BP 151/80 mmHg  Pulse 68  Temp(Src) 98.1 F (36.7 C) (Oral)  Resp 20  SpO2 100%   Physical Exam  Constitutional: She appears well-developed and well-nourished.  HENT:  Head: Normocephalic and atraumatic.  Eyes: Conjunctivae are normal.  Neck: Normal range of motion. Neck supple.  Pulmonary/Chest: Effort normal.  Abdominal: Soft. There is no tenderness. There is no CVA tenderness.  Musculoskeletal: Normal range of motion.       Cervical back: She exhibits normal range of motion, no tenderness and no bony tenderness.       Thoracic back: She exhibits normal range of motion, no tenderness and no bony tenderness.       Lumbar back: She exhibits tenderness. She exhibits normal range of motion and no bony tenderness.  No step-off noted with palpation of spine.   Neurological: She is alert. She has normal strength and normal reflexes. No sensory deficit.  5/5 strength in entire lower extremities bilaterally. No sensation deficit.   Skin: Skin is warm and dry. No rash noted.  Psychiatric: She has a normal mood and affect.  Nursing note and vitals reviewed.   ED Course  Procedures  DIAGNOSTIC STUDIES: Oxygen Saturation is 100% on RA, normal by my interpretation.    COORDINATION OF  CARE: 3:28 PM Discussed treatment plan with pt at bedside including at home rehabilitation techniques and an rx for tramadol and pt agreed to plan.  Labs Review Labs Reviewed - No data to display  Imaging Review No results found.   EKG Interpretation None       No red flag s/s of low back pain. Patient was counseled on back pain precautions and told to do activity as tolerated but do not lift, push, or pull heavy objects more than 10 pounds for the next week.  Patient counseled to use ice or heat on back for no longer than 15 minutes every hour.   Patient to continue muscle relaxer medication at home.  Patient  prescribed narcotic pain medicine and counseled on proper use of narcotic pain medications. Counseled not to combine this medication with others containing tylenol.   Urged patient not to drink alcohol, drive, or perform any other activities that requires focus while taking either of these medications.  Patient urged to follow-up with PCP if pain does not improve with treatment and rest or if pain becomes recurrent. Urged to return with worsening severe pain, loss of bowel or bladder control, trouble walking.   The patient verbalizes understanding and agrees with the plan.   MDM   Final diagnoses:  Bilateral low back pain without sciatica   Patient with back pain, chronic in nature. No new features or injuries. No neurological deficits. Patient is ambulatory. No warning symptoms of back pain including: fecal incontinence, urinary retention or overflow incontinence, night sweats, waking from sleep with back pain, unexplained fevers or weight loss, h/o cancer, IVDU, recent trauma. No concern for cauda equina, epidural abscess, or other serious cause of back pain. Conservative measures such as rest, ice/heat and pain medicine indicated with PCP follow-up if no improvement with conservative management.   I personally performed the services described in this documentation, which  was scribed in my presence. The recorded information has been reviewed and is accurate.    Carlisle Cater, PA-C 05/01/15 1544  Harvel Quale, MD 05/02/15 1213

## 2015-06-25 DIAGNOSIS — K120 Recurrent oral aphthae: Secondary | ICD-10-CM

## 2015-06-25 HISTORY — DX: Recurrent oral aphthae: K12.0

## 2016-01-31 DIAGNOSIS — Z8659 Personal history of other mental and behavioral disorders: Secondary | ICD-10-CM | POA: Diagnosis present

## 2016-01-31 HISTORY — DX: Personal history of other mental and behavioral disorders: Z86.59

## 2016-08-21 DIAGNOSIS — F419 Anxiety disorder, unspecified: Secondary | ICD-10-CM

## 2016-08-21 DIAGNOSIS — F418 Other specified anxiety disorders: Secondary | ICD-10-CM | POA: Diagnosis present

## 2016-08-21 HISTORY — DX: Other specified anxiety disorders: F41.8

## 2016-11-17 DIAGNOSIS — N183 Chronic kidney disease, stage 3 unspecified: Secondary | ICD-10-CM | POA: Diagnosis present

## 2016-11-17 HISTORY — DX: Chronic kidney disease, stage 3 unspecified: N18.30

## 2017-03-27 ENCOUNTER — Emergency Department (HOSPITAL_BASED_OUTPATIENT_CLINIC_OR_DEPARTMENT_OTHER)
Admission: EM | Admit: 2017-03-27 | Discharge: 2017-03-27 | Disposition: A | Discharge status: Home / Self Care | Payer: Medicare HMO | Attending: Emergency Medicine | Admitting: Emergency Medicine

## 2017-03-27 ENCOUNTER — Encounter (HOSPITAL_BASED_OUTPATIENT_CLINIC_OR_DEPARTMENT_OTHER): Payer: Self-pay | Admitting: Emergency Medicine

## 2017-03-27 DIAGNOSIS — Z859 Personal history of malignant neoplasm, unspecified: Secondary | ICD-10-CM | POA: Diagnosis not present

## 2017-03-27 DIAGNOSIS — M549 Dorsalgia, unspecified: Secondary | ICD-10-CM | POA: Diagnosis present

## 2017-03-27 DIAGNOSIS — M5442 Lumbago with sciatica, left side: Secondary | ICD-10-CM | POA: Insufficient documentation

## 2017-03-27 DIAGNOSIS — Z79899 Other long term (current) drug therapy: Secondary | ICD-10-CM | POA: Diagnosis not present

## 2017-03-27 DIAGNOSIS — F17210 Nicotine dependence, cigarettes, uncomplicated: Secondary | ICD-10-CM | POA: Diagnosis not present

## 2017-03-27 DIAGNOSIS — F1120 Opioid dependence, uncomplicated: Secondary | ICD-10-CM | POA: Diagnosis not present

## 2017-03-27 DIAGNOSIS — I10 Essential (primary) hypertension: Secondary | ICD-10-CM | POA: Insufficient documentation

## 2017-03-27 DIAGNOSIS — G8929 Other chronic pain: Secondary | ICD-10-CM

## 2017-03-27 MED ORDER — LIDOCAINE 5 % EX PTCH
1.0000 | MEDICATED_PATCH | CUTANEOUS | 0 refills | Status: AC
Start: 2017-03-27 — End: 2017-04-01

## 2017-03-27 MED ORDER — PREDNISONE 10 MG (21) PO TBPK: ORAL_TABLET | Freq: Every day | ORAL | 0 refills | Status: DC

## 2017-03-27 NOTE — Discharge Instructions (Signed)
You presented to the emergency department for chronic back pain that radiates to your left lower extremity.  I suspect that there is some component of sciatica, which is inflammation of your sciatic nerve contributing to your pain.  Take 1000 mg of Tylenol every 8 hours. Additionally place lidocaine patch to lower back. Take prednisone as prescribed to decrease inflammation around your nerve. A heating pad will also help.  We are unable to refill narcotic pain medication such as tramadol, Percocet, Vicodin in the emergency department for chronic pain or medical conditions. Please follow-up with your primary care provider for refills as needed. Follow-up with your appointment with pain clinic on October 1 as scheduled and discuss your chronic pain with pain providers.

## 2017-03-27 NOTE — ED Triage Notes (Addendum)
Arrived via [facility] c/o Low back pain radiating down leg x 2 days, no injury, hx of chronic back pain and on gabapentin for same.

## 2017-03-28 NOTE — ED Provider Notes (Signed)
[facility] Provider Note   CSN: [id number] Arrival date & time: 03/27/17  1845     History   Chief Complaint Chief Complaint  Patient presents with  . Back Pain    HPI Sherry [name] is a 61 y.o. female with history of chronic back pain on gabapentin, documented opioid dependence presents to the ED for evaluation of chronic bilateral low back pain that radiates down posterior left leg. Reports chronic back pain for more than 20 years after multiple low back surgeries. Acute on chronic low back pain since yesterday. States pain is in the same location as her chronic back pain but worse in intensity. Has been taking gabapentin 600 mg prescribed without relief of pain. Aggravated by direct palpation, bending, ambulation. Patient states she has a upcoming appointment with pain clinic October 1, gabapentin not helping with the pain. Denies falls or direct trauma to the low back. No fevers, chills, saddle anesthesia, numbness or weakness to lower extremities. No abdominal pain, dysuria, constipation, diarrhea. Chart review reveals patient has long history of chronic back pain. Has been referred to pain management, orthopedic, spine but has not been able to follow-up. Previous providers that had seen her at PCP office at urgent care and documented history of opiate addiction and have avoided prescribing narcotics. He was last seen by PCP on 03/02/17 for chronic low back pain with right-sided sciatica, she was referred to orthopedic surgery.  HPI  Past Medical History:  Diagnosis Date  . Cancer ([place])   . Chronic back pain   . Depression (emotion)   . Hepatitis C   . Hypertension   . Opioid dependence ([place])    in [facility]    There are no active problems to display for this patient.   Past Surgical History:  Procedure Laterality Date  . ABDOMINAL HYSTERECTOMY    . BACK SURGERY    . CHOLECYSTECTOMY    . MYOMECTOMY      OB History    No data available       Home  Medications    Prior to Admission medications   Medication Sig Start Date End Date Taking? Authorizing Provider  ARIPiprazole (ABILIFY) 10 MG tablet Take 10 mg by mouth daily.   Yes [provider]  FLUoxetine (PROZAC) 20 MG capsule Take 20 mg by mouth daily.   Yes [provider]  gabapentin (NEURONTIN) 600 MG tablet Take 600 mg by mouth 3 (three) times daily.   Yes [provider]  amoxicillin (AMOXIL) 500 MG capsule Take 2 capsules (1,000 mg total) by mouth 2 (two) times daily. 10/12/14   Debby Freiberg, MD  azithromycin (ZITHROMAX Z-PAK) 250 MG tablet 2 by mouth daily x1 then 1 by mouth daily x4 days 09/22/14   Lacretia Leigh, MD  clarithromycin (BIAXIN) 500 MG tablet Take 1 tablet (500 mg total) by mouth 2 (two) times daily. 10/12/14   Debby Freiberg, MD  hydrochlorothiazide (HYDRODIURIL) 25 MG tablet Take 25 mg by mouth daily.    [provider]  HYDROcodone-acetaminophen (NORCO/VICODIN) 5-325 MG per tablet Take 2 tablets by mouth every 4 (four) hours as needed. 02/17/15   Tanna Furry, MD  hydrOXYzine (ATARAX/VISTARIL) 25 MG tablet Take 25 mg by mouth 3 (three) times daily as needed.    [provider]  labetalol (NORMODYNE) 100 MG tablet Take 1 tablet (100 mg total) by mouth 2 (two) times daily. 09/22/14   Lacretia Leigh, MD  lidocaine (LIDODERM) 5 % Place 1 patch onto  the skin daily. Apply to lower back. Change every 24 hours. 03/27/17 04/01/17  Kinnie Feil, PA-C  methocarbamol (ROBAXIN) 500 MG tablet Take 1 tablet (500 mg total) by mouth 2 (two) times daily. 04/14/15   Mesner, Corene Cornea, MD  naproxen (NAPROSYN) 500 MG tablet Take 1 tablet (500 mg total) by mouth 2 (two) times daily. 02/17/15   Tanna Furry, MD  omeprazole (PRILOSEC) 20 MG capsule Take 1 capsule (20 mg total) by mouth daily. 10/12/14   Debby Freiberg, MD  oxyCODONE-acetaminophen (PERCOCET) 5-325 MG tablet Take 1 tablet by mouth every 8 (eight) hours as needed for severe pain. 04/14/15    Mesner, Corene Cornea, MD  pantoprazole (PROTONIX) 20 MG tablet Take 1 tablet (20 mg total) by mouth daily. 10/03/14   Orpah Greek, MD  predniSONE (STERAPRED UNI-PAK 21 TAB) 10 MG (21) TBPK tablet Take by mouth daily. Take 6 tabs by mouth daily  for 2 days, then 5 tabs for 2 days, then 4 tabs for 2 days, then 3 tabs for 2 days, 2 tabs for 2 days, then 1 tab by mouth daily for 2 days 03/27/17   Kinnie Feil, PA-C  sucralfate (CARAFATE) 1 G tablet Take 1 tablet (1 g total) by mouth 4 (four) times daily -  with meals and at bedtime. 10/12/14   Debby Freiberg, MD  traMADol (ULTRAM) 50 MG tablet Take 1 tablet (50 mg total) by mouth every 6 (six) hours as needed. 05/01/15   Carlisle Cater, PA-C  traZODone (DESYREL) 100 MG tablet Take 100 mg by mouth at bedtime.    [provider]  zolpidem (AMBIEN) 10 MG tablet Take 10 mg by mouth at bedtime as needed.    [provider]    Family History Family History  Problem Relation Age of Onset  . Family history unknown: Yes    Social History Social History  Substance Use Topics  . Smoking status: Current Every Day Smoker    Packs/day: 1.00    Types: Cigarettes  . Smokeless tobacco: Never Used  . Alcohol use No     Allergies   Flexeril [cyclobenzaprine]   Review of Systems Review of Systems  Constitutional: Negative for chills, diaphoresis and fever.  Cardiovascular: Negative for chest pain.  Gastrointestinal: Negative for abdominal pain, constipation, diarrhea, nausea and vomiting.  Genitourinary: Negative for dysuria, vaginal bleeding and vaginal discharge.  Musculoskeletal: Positive for back pain, gait problem and myalgias.  Skin: Negative for color change.     Physical Exam Updated Vital Signs BP (!) 165/105 (BP Location: Right Arm)   Pulse 66   Temp 98.6 F (37 C) (Oral)   Resp 18   Ht 5\' 2"  (1.575 m)   Wt 102.1 kg (225 lb)   SpO2 99%   BMI 41.15 kg/m   Physical Exam  Constitutional: She is oriented  to person, place, and time. She appears well-developed and well-nourished. No distress.  HENT:  Head: Normocephalic and atraumatic.  Nose: Nose normal.  Eyes: EOM are normal.  Neck:  Full AROM of cervical spine without pain or rigidity No midline cervical spine tenderness No cervical paraspinal muscle tenderness or increased tone  Cardiovascular: Normal rate, S1 normal, S2 normal and normal heart sounds.   Pulses:      Radial pulses are 2+ on the right side, and 2+ on the left side.       Dorsalis pedis pulses are 2+ on the right side, and 2+ on the left side.  Pulmonary/Chest: Effort  normal and breath sounds normal. She has no decreased breath sounds.  Abdominal: Soft. Normal appearance and bowel sounds are normal. There is no tenderness.  No suprapubic or CVA tenderness   Musculoskeletal: She exhibits tenderness.       Lumbar back: She exhibits tenderness and pain.  +L spine midline tenderness with light pressure +Bilateral L spine paraspinal muscular tenderness  +Bilateral SI joints and sciatic notches tender Full PROM hip bilaterally, pt reported pain with hip flexion bilaterally  + Positive SLR (left)  Neurological: She is alert and oriented to person, place, and time.  5/5 strength with flexion/extension of hip, knee and ankle, bilaterally.  Sensation to light touch intact in lower extremities including feet  Skin: Skin is warm and dry. Capillary refill takes less than 2 seconds.  No rash to lumbar back or buttocks  Psychiatric: She has a normal mood and affect. Her speech is normal and behavior is normal. Judgment and thought content normal. Cognition and memory are normal.  Nursing note and vitals reviewed.    ED Treatments / Results  Labs (all labs ordered are listed, but only abnormal results are displayed) Labs Reviewed - No data to display  EKG  EKG Interpretation None       Radiology No results found.  Procedures Procedures (including critical care  time)  Medications Ordered in ED Medications - No data to display   Initial Impression / Assessment and Plan / ED Course  I have reviewed the triage vital signs and the nursing notes.  Pertinent labs & imaging results that were available during my care of the patient were reviewed by me and considered in my medical decision making (see chart for details).    13-year-old female with history of obesity, documented oh. Addiction, chronic low back pain presents to the ED for evaluation of acute on chronic low back pain with radiation to left posterior leg since yesterday. No falls or direct trauma. No fevers, chills, abdominal pain, vomiting, diarrhea, dysuria, vaginal symptoms, saddle anesthesia, numbness or weakness to lower extremities. No history of IV drug use.  On exam pt has VSS, abdominal exam reassuring without suprapubic or CVAT. Distal pulses symmetric bilaterally.  Musculoskeletal exam revealed tenderness to diffuse lumbar spine with light pressure, +SLR on left.  No focal neurological deficits appreciated. Considered ruptured disc, UTI/pyelo, PID, kidney stone, cauda equina or epidural abscess however these don't fit clinical picture. No red flag symptoms of back pain including: bladder/bowel incontinence or retention, night sweats, night pain, fevers or weight loss, h/o cancer, IVDU, recent trauma or falls. Emergent lab work and Imaging not indicated today as physical exam reassuring. Encouraged conservative measures such as ice/heat, mild stretches, muscle relaxant and high dose tylenol, prednisone taper and lidocaine patch. Patient specifically asks for tramadol. I discussed strict policy in the ED about narcotic prescriptions for chronic conditions. Chart review reveals that patient has documented oh. Dependence. Narcotic database was reviewed, she has been recently prescribed tramadol by urgent care providers. Was last seen by PCP for chronic back pain who withheld a prescription of narcotic  pain medications and referred her to spine specialist. States she has an upcoming appointment with spine specialist October 1. Given documented history of opioid abuse, chronic back pain we'll defer prescription for further narcotic medications. We'll discharged with prednisone taper and lidocaine patch.  Final Clinical Impressions(s) / ED Diagnoses   Final diagnoses:  Chronic bilateral low back pain with left-sided sciatica    New Prescriptions Discharge Medication List  as of 03/27/2017  8:21 PM    START taking these medications   Details  lidocaine (LIDODERM) 5 % Place 1 patch onto the skin daily. Apply to lower back. Change every 24 hours., Starting Sat 03/27/2017, Until Thu 04/01/2017, Print    predniSONE (STERAPRED UNI-PAK 21 TAB) 10 MG (21) TBPK tablet Take by mouth daily. Take 6 tabs by mouth daily  for 2 days, then 5 tabs for 2 days, then 4 tabs for 2 days, then 3 tabs for 2 days, 2 tabs for 2 days, then 1 tab by mouth daily for 2 days, Starting Sat 03/27/2017, Print         Kinnie Feil, PA-C 03/28/17 0100    Gareth Morgan, MD 03/31/17 1414

## 2017-07-06 DIAGNOSIS — I10 Essential (primary) hypertension: Secondary | ICD-10-CM | POA: Diagnosis present

## 2017-07-06 HISTORY — DX: Essential (primary) hypertension: I10

## 2017-08-09 ENCOUNTER — Encounter (HOSPITAL_COMMUNITY): Payer: Self-pay

## 2017-08-09 ENCOUNTER — Inpatient Hospital Stay (HOSPITAL_COMMUNITY)
Admission: EM | Admit: 2017-08-09 | Discharge: 2017-08-12 | DRG: 885 | Disposition: A | Discharge status: Other Acute Inpatient Hospital | Payer: Medicare HMO | Attending: Nephrology | Admitting: Nephrology

## 2017-08-09 ENCOUNTER — Other Ambulatory Visit: Payer: Self-pay

## 2017-08-09 DIAGNOSIS — G4700 Insomnia, unspecified: Secondary | ICD-10-CM | POA: Diagnosis not present

## 2017-08-09 DIAGNOSIS — R51 Headache: Secondary | ICD-10-CM | POA: Diagnosis not present

## 2017-08-09 DIAGNOSIS — Z90710 Acquired absence of both cervix and uterus: Secondary | ICD-10-CM | POA: Diagnosis not present

## 2017-08-09 DIAGNOSIS — E872 Acidosis: Secondary | ICD-10-CM | POA: Diagnosis present

## 2017-08-09 DIAGNOSIS — Z888 Allergy status to other drugs, medicaments and biological substances status: Secondary | ICD-10-CM | POA: Diagnosis not present

## 2017-08-09 DIAGNOSIS — F418 Other specified anxiety disorders: Secondary | ICD-10-CM | POA: Diagnosis present

## 2017-08-09 DIAGNOSIS — B1920 Unspecified viral hepatitis C without hepatic coma: Secondary | ICD-10-CM | POA: Diagnosis present

## 2017-08-09 DIAGNOSIS — Z6841 Body Mass Index (BMI) 40.0 and over, adult: Secondary | ICD-10-CM | POA: Diagnosis not present

## 2017-08-09 DIAGNOSIS — F1010 Alcohol abuse, uncomplicated: Secondary | ICD-10-CM | POA: Diagnosis not present

## 2017-08-09 DIAGNOSIS — F1994 Other psychoactive substance use, unspecified with psychoactive substance-induced mood disorder: Secondary | ICD-10-CM | POA: Diagnosis not present

## 2017-08-09 DIAGNOSIS — M549 Dorsalgia, unspecified: Secondary | ICD-10-CM | POA: Diagnosis not present

## 2017-08-09 DIAGNOSIS — F1120 Opioid dependence, uncomplicated: Secondary | ICD-10-CM | POA: Diagnosis present

## 2017-08-09 DIAGNOSIS — F10939 Alcohol use, unspecified with withdrawal, unspecified: Secondary | ICD-10-CM | POA: Diagnosis present

## 2017-08-09 DIAGNOSIS — R450 Nervousness: Secondary | ICD-10-CM | POA: Diagnosis not present

## 2017-08-09 DIAGNOSIS — Z736 Limitation of activities due to disability: Secondary | ICD-10-CM | POA: Diagnosis not present

## 2017-08-09 DIAGNOSIS — F10239 Alcohol dependence with withdrawal, unspecified: Secondary | ICD-10-CM | POA: Diagnosis not present

## 2017-08-09 DIAGNOSIS — F329 Major depressive disorder, single episode, unspecified: Secondary | ICD-10-CM | POA: Diagnosis present

## 2017-08-09 DIAGNOSIS — F17210 Nicotine dependence, cigarettes, uncomplicated: Secondary | ICD-10-CM | POA: Diagnosis present

## 2017-08-09 DIAGNOSIS — R45851 Suicidal ideations: Secondary | ICD-10-CM

## 2017-08-09 DIAGNOSIS — F1910 Other psychoactive substance abuse, uncomplicated: Secondary | ICD-10-CM | POA: Diagnosis present

## 2017-08-09 DIAGNOSIS — N179 Acute kidney failure, unspecified: Secondary | ICD-10-CM | POA: Diagnosis present

## 2017-08-09 DIAGNOSIS — Z880 Allergy status to penicillin: Secondary | ICD-10-CM | POA: Diagnosis not present

## 2017-08-09 DIAGNOSIS — F4010 Social phobia, unspecified: Secondary | ICD-10-CM | POA: Diagnosis not present

## 2017-08-09 DIAGNOSIS — B182 Chronic viral hepatitis C: Secondary | ICD-10-CM | POA: Diagnosis present

## 2017-08-09 DIAGNOSIS — F419 Anxiety disorder, unspecified: Secondary | ICD-10-CM | POA: Diagnosis present

## 2017-08-09 DIAGNOSIS — Z79899 Other long term (current) drug therapy: Secondary | ICD-10-CM | POA: Diagnosis not present

## 2017-08-09 DIAGNOSIS — N183 Chronic kidney disease, stage 3 unspecified: Secondary | ICD-10-CM | POA: Diagnosis present

## 2017-08-09 DIAGNOSIS — Z915 Personal history of self-harm: Secondary | ICD-10-CM

## 2017-08-09 DIAGNOSIS — R443 Hallucinations, unspecified: Secondary | ICD-10-CM | POA: Diagnosis not present

## 2017-08-09 DIAGNOSIS — E8729 Other acidosis: Secondary | ICD-10-CM

## 2017-08-09 DIAGNOSIS — G2401 Drug induced subacute dyskinesia: Secondary | ICD-10-CM | POA: Diagnosis not present

## 2017-08-09 DIAGNOSIS — F333 Major depressive disorder, recurrent, severe with psychotic symptoms: Secondary | ICD-10-CM | POA: Diagnosis present

## 2017-08-09 DIAGNOSIS — F1099 Alcohol use, unspecified with unspecified alcohol-induced disorder: Secondary | ICD-10-CM | POA: Diagnosis not present

## 2017-08-09 DIAGNOSIS — I129 Hypertensive chronic kidney disease with stage 1 through stage 4 chronic kidney disease, or unspecified chronic kidney disease: Secondary | ICD-10-CM | POA: Diagnosis present

## 2017-08-09 DIAGNOSIS — I10 Essential (primary) hypertension: Secondary | ICD-10-CM | POA: Diagnosis present

## 2017-08-09 DIAGNOSIS — J029 Acute pharyngitis, unspecified: Secondary | ICD-10-CM | POA: Diagnosis not present

## 2017-08-09 DIAGNOSIS — F332 Major depressive disorder, recurrent severe without psychotic features: Secondary | ICD-10-CM | POA: Diagnosis not present

## 2017-08-09 DIAGNOSIS — N189 Chronic kidney disease, unspecified: Secondary | ICD-10-CM | POA: Diagnosis not present

## 2017-08-09 DIAGNOSIS — T512X1A Toxic effect of 2-Propanol, accidental (unintentional), initial encounter: Secondary | ICD-10-CM | POA: Insufficient documentation

## 2017-08-09 DIAGNOSIS — E669 Obesity, unspecified: Secondary | ICD-10-CM | POA: Diagnosis not present

## 2017-08-09 DIAGNOSIS — G8929 Other chronic pain: Secondary | ICD-10-CM | POA: Diagnosis present

## 2017-08-09 DIAGNOSIS — Z8659 Personal history of other mental and behavioral disorders: Secondary | ICD-10-CM | POA: Diagnosis present

## 2017-08-09 DIAGNOSIS — F1110 Opioid abuse, uncomplicated: Secondary | ICD-10-CM | POA: Diagnosis not present

## 2017-08-09 DIAGNOSIS — F10230 Alcohol dependence with withdrawal, uncomplicated: Secondary | ICD-10-CM | POA: Diagnosis not present

## 2017-08-09 DIAGNOSIS — Z9049 Acquired absence of other specified parts of digestive tract: Secondary | ICD-10-CM

## 2017-08-09 DIAGNOSIS — N390 Urinary tract infection, site not specified: Secondary | ICD-10-CM | POA: Diagnosis not present

## 2017-08-09 DIAGNOSIS — Z8619 Personal history of other infectious and parasitic diseases: Secondary | ICD-10-CM | POA: Diagnosis present

## 2017-08-09 DIAGNOSIS — Z853 Personal history of malignant neoplasm of breast: Secondary | ICD-10-CM | POA: Diagnosis not present

## 2017-08-09 DIAGNOSIS — Z972 Presence of dental prosthetic device (complete) (partial): Secondary | ICD-10-CM | POA: Diagnosis not present

## 2017-08-09 DIAGNOSIS — K219 Gastro-esophageal reflux disease without esophagitis: Secondary | ICD-10-CM | POA: Diagnosis not present

## 2017-08-09 HISTORY — DX: Alcohol use, unspecified with withdrawal, unspecified: F10.939

## 2017-08-09 HISTORY — DX: Alcohol dependence with withdrawal, unspecified: F10.239

## 2017-08-09 LAB — I-STAT VENOUS BLOOD GAS, ED
Acid-base deficit: 6 mmol/L — ABNORMAL HIGH (ref 0.0–2.0)
Bicarbonate: 18.4 mmol/L — ABNORMAL LOW (ref 20.0–28.0)
O2 Saturation: 90 %
TCO2: 19 mmol/L — ABNORMAL LOW (ref 22–32)
pCO2, Ven: 33.9 mmHg — ABNORMAL LOW (ref 44.0–60.0)
pH, Ven: 7.343 (ref 7.250–7.430)
pO2, Ven: 62 mmHg — ABNORMAL HIGH (ref 32.0–45.0)

## 2017-08-09 LAB — CBC
HCT: 30.4 % — ABNORMAL LOW (ref 36.0–46.0)
Hemoglobin: 9.1 g/dL — ABNORMAL LOW (ref 12.0–15.0)
MCH: 23.4 pg — ABNORMAL LOW (ref 26.0–34.0)
MCHC: 29.9 g/dL — ABNORMAL LOW (ref 30.0–36.0)
MCV: 78.1 fL (ref 78.0–100.0)
Platelets: 369 10*3/uL (ref 150–400)
RBC: 3.89 MIL/uL (ref 3.87–5.11)
RDW: 17.7 % — ABNORMAL HIGH (ref 11.5–15.5)
WBC: 6.9 10*3/uL (ref 4.0–10.5)

## 2017-08-09 LAB — RAPID URINE DRUG SCREEN, HOSP PERFORMED
Amphetamines: NOT DETECTED
Barbiturates: NOT DETECTED
Benzodiazepines: NOT DETECTED
Cocaine: NOT DETECTED
Opiates: NOT DETECTED
Tetrahydrocannabinol: NOT DETECTED

## 2017-08-09 LAB — SALICYLATE LEVEL: Salicylate Lvl: <7 mg/dL (ref 2.8–30.0)

## 2017-08-09 LAB — COMPREHENSIVE METABOLIC PANEL
ALT: 15 U/L (ref 14–54)
AST: 21 U/L (ref 15–41)
Albumin: 3.7 g/dL (ref 3.5–5.0)
Alkaline Phosphatase: 90 U/L (ref 38–126)
Anion gap: 16 — ABNORMAL HIGH (ref 5–15)
BUN: 30 mg/dL — ABNORMAL HIGH (ref 6–20)
CO2: 13 mmol/L — ABNORMAL LOW (ref 22–32)
Calcium: 9.4 mg/dL (ref 8.9–10.3)
Chloride: 110 mmol/L (ref 101–111)
Creatinine, Ser: 2.96 mg/dL — ABNORMAL HIGH (ref 0.44–1.00)
GFR calc Af Amer: 19 mL/min — ABNORMAL LOW (ref >60)
GFR calc non Af Amer: 16 mL/min — ABNORMAL LOW (ref >60)
Glucose, Bld: 114 mg/dL — ABNORMAL HIGH (ref 65–99)
Potassium: 3.6 mmol/L (ref 3.5–5.1)
Sodium: 139 mmol/L (ref 135–145)
Total Bilirubin: 0.4 mg/dL (ref 0.3–1.2)
Total Protein: 7.3 g/dL (ref 6.5–8.1)

## 2017-08-09 LAB — VOLATILES,BLD-ACETONE,ETHANOL,ISOPROP,METHANOL
Acetone, blood: NEGATIVE % (ref 0.000–0.010)
Ethanol, blood: NEGATIVE % (ref 0.000–0.010)
Isopropanol, blood: NEGATIVE % (ref 0.000–0.010)
Methanol, blood: NEGATIVE % (ref 0.000–0.010)

## 2017-08-09 LAB — OSMOLALITY: Osmolality: 300 mOsm/kg — ABNORMAL HIGH (ref 275–295)

## 2017-08-09 LAB — ACETAMINOPHEN LEVEL: Acetaminophen (Tylenol), Serum: <10 ug/mL — ABNORMAL LOW (ref 10–30)

## 2017-08-09 LAB — ETHANOL: Alcohol, Ethyl (B): <10 mg/dL (ref <10)

## 2017-08-09 MED ORDER — LORAZEPAM 2 MG/ML IJ SOLN
0.0000 mg | Freq: Four times a day (QID) | INTRAMUSCULAR | Status: AC
  Administered 2017-08-10: 1 mg via INTRAVENOUS
  Administered 2017-08-10: 2 mg via INTRAVENOUS
  Administered 2017-08-11: 1 mg via INTRAVENOUS
  Filled 2017-08-09 (×3): qty 1

## 2017-08-09 MED ORDER — THIAMINE HCL 100 MG/ML IJ SOLN: 100.0000 mg | Freq: Every day | INTRAMUSCULAR | Status: DC

## 2017-08-09 MED ORDER — SODIUM CHLORIDE 0.9 % IV SOLN
INTRAVENOUS | Status: DC
  Administered 2017-08-09: 19:00:00 via INTRAVENOUS
  Administered 2017-08-10: 1 mL via INTRAVENOUS
  Administered 2017-08-10: 05:00:00 via INTRAVENOUS
  Administered 2017-08-10: 1 mL via INTRAVENOUS
  Administered 2017-08-11 (×2): via INTRAVENOUS

## 2017-08-09 MED ORDER — ISOPROPYL ALCOHOL 70 % SOLN: Freq: Once | Status: DC

## 2017-08-09 MED ORDER — ONDANSETRON HCL 4 MG PO TABS
4.0000 mg | ORAL_TABLET | Freq: Three times a day (TID) | ORAL | Status: DC | PRN
  Administered 2017-08-10: 4 mg via ORAL
  Filled 2017-08-09: qty 1

## 2017-08-09 MED ORDER — SUCRALFATE 1 G PO TABS
1.0000 g | ORAL_TABLET | Freq: Three times a day (TID) | ORAL | Status: DC
  Administered 2017-08-10 – 2017-08-12 (×11): 1 g via ORAL
  Filled 2017-08-09 (×11): qty 1

## 2017-08-09 MED ORDER — LORAZEPAM 1 MG PO TABS
0.0000 mg | ORAL_TABLET | Freq: Four times a day (QID) | ORAL | Status: AC
  Administered 2017-08-09: 2 mg via ORAL
  Administered 2017-08-10: 4 mg via ORAL
  Administered 2017-08-11: 2 mg via ORAL
  Filled 2017-08-09 (×2): qty 4
  Filled 2017-08-09: qty 2

## 2017-08-09 MED ORDER — ACETAMINOPHEN 325 MG PO TABS
650.0000 mg | ORAL_TABLET | ORAL | Status: DC | PRN
  Administered 2017-08-10 – 2017-08-12 (×5): 650 mg via ORAL
  Filled 2017-08-09 (×5): qty 2

## 2017-08-09 MED ORDER — LORAZEPAM 2 MG/ML IJ SOLN: 0.0000 mg | Freq: Two times a day (BID) | INTRAMUSCULAR | Status: DC

## 2017-08-09 MED ORDER — VITAMIN B-1 100 MG PO TABS
100.0000 mg | ORAL_TABLET | Freq: Every day | ORAL | Status: DC
  Administered 2017-08-09 – 2017-08-12 (×4): 100 mg via ORAL
  Filled 2017-08-09 (×4): qty 1

## 2017-08-09 MED ORDER — LORAZEPAM 1 MG PO TABS
0.0000 mg | ORAL_TABLET | Freq: Two times a day (BID) | ORAL | Status: DC
  Administered 2017-08-11 – 2017-08-12 (×2): 1 mg via ORAL
  Filled 2017-08-09: qty 1
  Filled 2017-08-09: qty 2

## 2017-08-09 MED ORDER — FLUOXETINE HCL 20 MG PO CAPS
20.0000 mg | ORAL_CAPSULE | Freq: Every day | ORAL | Status: DC
  Administered 2017-08-10 – 2017-08-12 (×4): 20 mg via ORAL
  Filled 2017-08-09 (×5): qty 1

## 2017-08-09 NOTE — ED Notes (Signed)
Pt stated she was wanded in triage by security

## 2017-08-09 NOTE — ED Triage Notes (Signed)
Patient reports that she is suicidal and has been taking her prozac everyday as prescribed. States that she drinks daily and last use this am. Last opiates yesterday. Patient alert and oriented. No other complaints

## 2017-08-09 NOTE — ED Notes (Signed)
Sitter assisted pt to bathroom, no urine sample provided at this time. Sitter and pt now aware that urine sample is needed
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
## 2017-08-09 NOTE — BHH Counselor (Signed)
Disposition:   Per Elmarie Shiley, NP, patient recommended for inpatient treatment. TTS to seek treatment.

## 2017-08-09 NOTE — ED Notes (Signed)
Pt requesting gabapentin for generalized pain and something to help her sleep. MD paged

## 2017-08-09 NOTE — ED Provider Notes (Signed)
[facility] EMERGENCY DEPARTMENT Provider Note  CSN: [id number] Arrival date & time: 08/09/17 1115  Chief Complaint(s) No chief complaint on file.  HPI Sherry [name] is a 62 y.o. female with a history of daily alcohol abuse and polysubstance abuse including crack cocaine and pain pills, presents to the emergency department endorsing suicidal ideations.  Patient endorses prior suicide attempt 30 years ago by cutting her wrist.  States that her plan is the same.  She is been having suicidal ideations for couple days.  States that she feels like all these drugs are going to kill her and she wants to end it all.  Endorse recent drug use.  States that the last alcohol drink was 40 minutes prior to arrival.  She is endorsing feeling jittery.  Denies recent fevers or infections.  Denies any other physical complaints.    HPI  Past Medical History Past Medical History:  Diagnosis Date  . Cancer ([place])   . Chronic back pain   . Depression (emotion)   . Hepatitis C   . Hypertension   . Opioid dependence ([place])    in [facility]   There are no active problems to display for this patient.  Home Medication(s) Prior to Admission medications   Medication Sig Start Date End Date Taking? Authorizing Provider  amoxicillin (AMOXIL) 500 MG capsule Take 2 capsules (1,000 mg total) by mouth 2 (two) times daily. 10/12/14   Debby Freiberg, MD  ARIPiprazole (ABILIFY) 10 MG tablet Take 10 mg by mouth daily.    [provider]  azithromycin (ZITHROMAX Z-PAK) 250 MG tablet 2 by mouth daily x1 then 1 by mouth daily x4 days 09/22/14   Lacretia Leigh, MD  clarithromycin (BIAXIN) 500 MG tablet Take 1 tablet (500 mg total) by mouth 2 (two) times daily. 10/12/14   Debby Freiberg, MD  FLUoxetine (PROZAC) 20 MG capsule Take 20 mg by mouth daily.    [provider]  gabapentin (NEURONTIN) 600 MG tablet Take 600 mg by mouth 3 (three) times daily.    [provider]    hydrochlorothiazide (HYDRODIURIL) 25 MG tablet Take 25 mg by mouth daily.    [provider]  HYDROcodone-acetaminophen (NORCO/VICODIN) 5-325 MG per tablet Take 2 tablets by mouth every 4 (four) hours as needed. 02/17/15   Tanna Furry, MD  hydrOXYzine (ATARAX/VISTARIL) 25 MG tablet Take 25 mg by mouth 3 (three) times daily as needed.    [provider]  labetalol (NORMODYNE) 100 MG tablet Take 1 tablet (100 mg total) by mouth 2 (two) times daily. 09/22/14   Lacretia Leigh, MD  methocarbamol (ROBAXIN) 500 MG tablet Take 1 tablet (500 mg total) by mouth 2 (two) times daily. 04/14/15   Mesner, Corene Cornea, MD  naproxen (NAPROSYN) 500 MG tablet Take 1 tablet (500 mg total) by mouth 2 (two) times daily. 02/17/15   Tanna Furry, MD  omeprazole (PRILOSEC) 20 MG capsule Take 1 capsule (20 mg total) by mouth daily. 10/12/14   Debby Freiberg, MD  oxyCODONE-acetaminophen (PERCOCET) 5-325 MG tablet Take 1 tablet by mouth every 8 (eight) hours as needed for severe pain. 04/14/15   Mesner, Corene Cornea, MD  pantoprazole (PROTONIX) 20 MG tablet Take 1 tablet (20 mg total) by mouth daily. 10/03/14   Orpah Greek, MD  predniSONE (STERAPRED UNI-PAK 21 TAB) 10 MG (21) TBPK tablet Take by mouth daily. Take 6 tabs by mouth daily  for 2 days, then 5 tabs for 2 days, then 4 tabs for 2  days, then 3 tabs for 2 days, 2 tabs for 2 days, then 1 tab by mouth daily for 2 days 03/27/17   Kinnie Feil, PA-C  sucralfate (CARAFATE) 1 G tablet Take 1 tablet (1 g total) by mouth 4 (four) times daily -  with meals and at bedtime. 10/12/14   Debby Freiberg, MD  traMADol (ULTRAM) 50 MG tablet Take 1 tablet (50 mg total) by mouth every 6 (six) hours as needed. 05/01/15   Carlisle Cater, PA-C  traZODone (DESYREL) 100 MG tablet Take 100 mg by mouth at bedtime.    [provider]  zolpidem (AMBIEN) 10 MG tablet Take 10 mg by mouth at bedtime as needed.    [provider]                                                                                                                                     Past Surgical History Past Surgical History:  Procedure Laterality Date  . ABDOMINAL HYSTERECTOMY    . BACK SURGERY    . CHOLECYSTECTOMY    . MYOMECTOMY     Family History Family History  Family history unknown: Yes    Social History Social History   Tobacco Use  . Smoking status: Current Every Day Smoker    Packs/day: 1.00    Types: Cigarettes  . Smokeless tobacco: Never Used  Substance Use Topics  . Alcohol use: No  . Drug use: No   Allergies Flexeril [cyclobenzaprine]  Review of Systems Review of Systems All other systems are reviewed and are negative for acute change except as noted in the HPI  Physical Exam Vital Signs  I have reviewed the triage vital signs BP 121/79 (BP Location: Right Arm)   Pulse 65   Temp 97.8 F (36.6 C) (Oral)   Resp 18   Ht 5\' 2"  (1.575 m)   Wt 111.1 kg (245 lb)   SpO2 98%   BMI 44.81 kg/m   Physical Exam  Constitutional: She is oriented to person, place, and time. She appears well-developed and well-nourished. No distress.  HENT:  Head: Normocephalic and atraumatic.  Nose: Nose normal.  Eyes: Conjunctivae and EOM are normal. Pupils are equal, round, and reactive to light. Right eye exhibits no discharge. Left eye exhibits no discharge. No scleral icterus.  Neck: Normal range of motion. Neck supple.  Cardiovascular: Normal rate and regular rhythm. Exam reveals no gallop and no friction rub.  No murmur heard. Pulmonary/Chest: Effort normal and breath sounds normal. No stridor. No respiratory distress. She has no rales.  Abdominal: Soft. She exhibits no distension. There is no tenderness.  Musculoskeletal: She exhibits no edema or tenderness.  Neurological: She is alert and oriented to person, place, and time.  Lip smacking  Skin: Skin is warm and dry. No rash noted. She is not diaphoretic. No erythema.  Psychiatric: She has a normal mood and  affect.  Vitals reviewed.   ED Results and Treatments Labs (all labs ordered are listed, but only abnormal results are displayed) Labs Reviewed  COMPREHENSIVE METABOLIC PANEL - Abnormal; Notable for the following components:      Result Value   CO2 13 (*)    Glucose, Bld 114 (*)    BUN 30 (*)    Creatinine, Ser 2.96 (*)    GFR calc non Af Amer 16 (*)    GFR calc Af Amer 19 (*)    Anion gap 16 (*)    All other components within normal limits  ACETAMINOPHEN LEVEL - Abnormal; Notable for the following components:   Acetaminophen (Tylenol), Serum <10 (*)    All other components within normal limits  CBC - Abnormal; Notable for the following components:   Hemoglobin 9.1 (*)    HCT 30.4 (*)    MCH 23.4 (*)    MCHC 29.9 (*)    RDW 17.7 (*)    All other components within normal limits  ETHANOL  SALICYLATE LEVEL  RAPID URINE DRUG SCREEN, HOSP PERFORMED  BLOOD GAS, VENOUS  OSMOLALITY  OSMOLALITY, URINE  VOLATILES,BLD-ACETONE,ETHANOL,ISOPROP,METHANOL                                                                                                                         EKG  EKG Interpretation  Date/Time:    Ventricular Rate:    PR Interval:    QRS Duration:   QT Interval:    QTC Calculation:   R Axis:     Text Interpretation:        Radiology No results found. Pertinent labs & imaging results that were available during my care of the patient were reviewed by me and considered in my medical decision making (see chart for details).  Medications Ordered in ED Medications  LORazepam (ATIVAN) injection 0-4 mg (not administered)    Or  LORazepam (ATIVAN) tablet 0-4 mg (not administered)  LORazepam (ATIVAN) injection 0-4 mg (not administered)    Or  LORazepam (ATIVAN) tablet 0-4 mg (not administered)  thiamine (VITAMIN B-1) tablet 100 mg (not administered)    Or  thiamine (B-1) injection 100 mg (not administered)  acetaminophen (TYLENOL) tablet 650 mg (not administered)    ondansetron (ZOFRAN) tablet 4 mg (not administered)  Procedures Procedures CRITICAL CARE Performed by: Grayce Sessions Cardama Total critical care time: 45 minutes Critical care time was exclusive of separately billable procedures and treating other patients. Critical care was necessary to treat or prevent imminent or life-threatening deterioration. Critical care was time spent personally by me on the following activities: development of treatment plan with patient and/or surrogate as well as nursing, discussions with consultants, evaluation of patient's response to treatment, examination of patient, obtaining history from patient or surrogate, ordering and performing treatments and interventions, ordering and review of laboratory studies, ordering and review of radiographic studies, pulse oximetry and re-evaluation of patient's condition.   (including critical care time)  Medical Decision Making / ED Course I have reviewed the nursing notes for this encounter and the patient's prior records (if available in EHR or on provided paperwork).    Patient with history of daily alcohol abuse.  Here with suicidal ideations.  Screening labs obtain.  Behavioral health evaluate the patient and recommended inpatient management..  Screening labs returned and revealed evidence of anion gap acidosis and acute renal insufficiency.  Coingestion labs reassuring.  Upon further questioning patient endorsed intermittently drinking rubbing alcohol.  She reports the last use was 2 days ago.  But does endorse frequent consumption of Listerine.  States that her last use was today.  Given these findings is a concern for isopropyl alcohol toxicity.  Additional labs ordered.  Will discuss case with medicine for admission and continued management.   Final Clinical Impression(s) / ED  Diagnoses Final diagnoses:  Isopropyl alcohol poisoning  High anion gap metabolic acidosis  AKI (acute kidney injury) ([place])  Suicide ideation      This chart was dictated using voice recognition software.  Despite best efforts to proofread,  errors can occur which can change the documentation meaning.   Fatima Blank, MD 08/09/17 [phone]

## 2017-08-09 NOTE — ED Notes (Signed)
Sitter informed this tech that MD ok'd Pt to eat and drink. Pt was given a coke and also notified that she was being placed in a room.

## 2017-08-09 NOTE — ED Notes (Signed)
Patients pocketbook taken home by patients sister-[name] Amedeo Plenty

## 2017-08-09 NOTE — H&P (Signed)
History and Physical    Sherry [name] BWI:[id number] DOB: March 29, 1956 DOA: 08/09/2017  PCP: Henderson Baltimore, FNP   Patient coming from: Home  Chief Complaint: Suicidal ideation  HPI: Sherry [name] is a 62 y.o. female with medical history significant of chronic kidney disease unknown baseline, bipolar disorder, depression anxiety, hepatitis C who comes in for psychiatric evaluation.  Patient reports that she has been drinking "a lot of vodka" however she cannot tell me how much "a lot is".  She reports a whole bottle.  She reports her last drink was approximately 40 minutes ago.  She also reports drinking Listerine.  She also reports taking up to 15 pills a day of "pain pills that she gets off the street".  She reports that this lifestyle has caused her to become increasingly depressed and is induced significant suicidal ideation in her.  She does have a plan which is to slit her wrists.  She has tried this before.  She reports a remote history of psychiatric hospitalization for similar.  She also reports a history of tremors and tremulousness when not drinking.  But she denies any history of seizures or hospitalization for alcohol withdrawal specifically prior.  She denies any recent recreational drug use however does report use of crack cocaine approximately 4 months ago.  He denies any hallucination, delusions, chest pain, nausea vomiting, diarrhea, homicidal ideations.  She does not have any access to guns.  ED Course: In the ED vitals are normal.  Labs were notable for a creatinine of 2.96 with a baseline of 1.82 years ago.  There was noted to be high anion gap metabolic acidosis of 16.  Her hemoglobin was 9.1 and her hematocrit was 30.4.  Because patient reported drinking Listerine and patient's alcohol level was 0 despite stating that she drank vodka "40 minutes prior to going to the ED" patient was admitted for both alcohol withdrawal and concern for isopropyl isopropyl alcohol toxicity.  Review  of Systems: As per HPI otherwise 10 point review of systems negative.    Past Medical History:  Diagnosis Date  . Cancer ([place])   . Chronic back pain   . Depression (emotion)   . Hepatitis C   . Hypertension   . Opioid dependence ([place])    in [place]    Past Surgical History:  Procedure Laterality Date  . ABDOMINAL HYSTERECTOMY    . BACK SURGERY    . CHOLECYSTECTOMY    . MYOMECTOMY       reports that she has been smoking cigarettes.  She has been smoking about 1.00 pack per day. she has never used smokeless tobacco. She reports that she does not drink alcohol or use drugs.  Allergies  Allergen Reactions  . Flexeril [Cyclobenzaprine]     Pt states Flexeril makes her feel depressed     Family History  Family history unknown: Yes   Unacceptable: Noncontributory, unremarkable, or negative. Acceptable: Family history reviewed and not pertinent (If you reviewed it)  Prior to Admission medications   Medication Sig Start Date End Date Taking? Authorizing Provider  FLUoxetine (PROZAC) 20 MG capsule Take 20 mg by mouth daily.   Yes [provider]  hydrochlorothiazide (HYDRODIURIL) 25 MG tablet Take 25 mg by mouth daily.   Yes [provider]  labetalol (NORMODYNE) 100 MG tablet Take 1 tablet (100 mg total) by mouth 2 (two) times daily. Patient taking differently: Take 300 mg by mouth 2 (two) times daily.  09/22/14  Yes Lacretia Leigh,  MD  zolpidem (AMBIEN) 10 MG tablet Take 10 mg by mouth at bedtime as needed.   Yes [provider]  omeprazole (PRILOSEC) 20 MG capsule Take 1 capsule (20 mg total) by mouth daily. Patient not taking: Reported on 08/09/2017 10/12/14   Debby Freiberg, MD  pantoprazole (PROTONIX) 20 MG tablet Take 1 tablet (20 mg total) by mouth daily. Patient not taking: Reported on 08/09/2017 10/03/14   Orpah Greek, MD  sucralfate (CARAFATE) 1 G tablet Take 1 tablet (1 g total) by mouth 4 (four) times daily -  with meals and at  bedtime. Patient not taking: Reported on 08/09/2017 10/12/14   Debby Freiberg, MD    Physical Exam: Vitals:   08/09/17 1126 08/09/17 1128  BP: 121/79   Pulse: 65   Resp: 18   Temp: 97.8 F (36.6 C)   TempSrc: Oral   SpO2: 98%   Weight:  111.1 kg (245 lb)  Height:  5\' 2"  (1.575 m)    Constitutional: NAD, calm, comfortable Vitals:   08/09/17 1126 08/09/17 1128  BP: 121/79   Pulse: 65   Resp: 18   Temp: 97.8 F (36.6 C)   TempSrc: Oral   SpO2: 98%   Weight:  111.1 kg (245 lb)  Height:  5\' 2"  (1.575 m)   Eyes:  lids and conjunctivae normal ENMT: Mucous membranes are moist.  Edentulous Neck: Supple Respiratory: clear to auscultation bilaterally, no wheezing, no crackles. Normal respiratory effort. No accessory muscle use.  Cardiovascular: Regular rate and rhythm, no murmurs / rubs / gallops. No extremity edema. Abdomen: no tenderness, no masses palpated. No hepatosplenomegaly. Bowel sounds positive.  Musculoskeletal: n no lower extremity edema  skin: no rashe on visible skin Neurologic: CN 2-12 grossly intact. Psychiatric: Patient's judgment and insight are relatively intact.  She is alert and oriented x3.  She reports her mood is depressed.  She denies any hallucinations or delusions.  Labs on Admission: I have personally reviewed following labs and imaging studies  CBC: Recent Labs  Lab 08/09/17 1132  WBC 6.9  HGB 9.1*  HCT 30.4*  MCV 78.1  PLT 564   Basic Metabolic Panel: Recent Labs  Lab 08/09/17 1132  NA 139  K 3.6  CL 110  CO2 13*  GLUCOSE 114*  BUN 30*  CREATININE 2.96*  CALCIUM 9.4   GFR: Estimated Creatinine Clearance: 23.5 mL/min (A) (by C-G formula based on SCr of 2.96 mg/dL (H)). Liver Function Tests: Recent Labs  Lab 08/09/17 1132  AST 21  ALT 15  ALKPHOS 90  BILITOT 0.4  PROT 7.3  ALBUMIN 3.7   No results for input(s): LIPASE, AMYLASE in the last 168 hours. No results for input(s): AMMONIA in the last 168 hours. Coagulation  Profile: No results for input(s): INR, PROTIME in the last 168 hours. Cardiac Enzymes: No results for input(s): CKTOTAL, CKMB, CKMBINDEX, TROPONINI in the last 168 hours. BNP (last 3 results) No results for input(s): PROBNP in the last 8760 hours. HbA1C: No results for input(s): HGBA1C in the last 72 hours. CBG: No results for input(s): GLUCAP in the last 168 hours. Lipid Profile: No results for input(s): CHOL, HDL, LDLCALC, TRIG, CHOLHDL, LDLDIRECT in the last 72 hours. Thyroid Function Tests: No results for input(s): TSH, T4TOTAL, FREET4, T3FREE, THYROIDAB in the last 72 hours. Anemia Panel: No results for input(s): VITAMINB12, FOLATE, FERRITIN, TIBC, IRON, RETICCTPCT in the last 72 hours. Urine analysis:    Component Value Date/Time   COLORURINE YELLOW 01/12/2010 1941  APPEARANCEUR CLEAR 01/12/2010 1941   LABSPEC 1.013 01/12/2010 1941   PHURINE 6.0 01/12/2010 1941   GLUCOSEU NEGATIVE 01/12/2010 1941   HGBUR NEGATIVE 01/12/2010 1941   BILIRUBINUR NEGATIVE 01/12/2010 1941   KETONESUR NEGATIVE 01/12/2010 1941   PROTEINUR NEGATIVE 01/12/2010 1941   UROBILINOGEN 0.2 01/12/2010 1941   NITRITE NEGATIVE 01/12/2010 1941   LEUKOCYTESUR  01/12/2010 1941    NEGATIVE MICROSCOPIC NOT DONE ON URINES WITH NEGATIVE PROTEIN, BLOOD, LEUKOCYTES, NITRITE, OR GLUCOSE <1000 mg/dL.    Radiological Exams on Admission: No results found.  EKG: Independently reviewed.  No acute changes, normal QT  Assessment/Plan Principal Problem:   Alcohol withdrawal (HCC) Active Problems:   Anxiety   Bipolar disorder (HCC)   Chronic hepatitis C (HCC)   Chronic kidney disease, stage III (moderate) (HCC)   Essential hypertension   Sherry [name] is a 62 year old woman with past medical history relevant for polysubstance abuse, alcohol abuse, chronic hepatitis C, chronic kidney disease stage III, essential hypertension who comes in with suicidal ideation.  ##) Suicidal ideation: Currently patient reports  suicidal ideation reports a remote history of suicide attempt.  She does have an intact plan. -Sitter precautions -Psychiatry consult for placement once medically cleared for inpatient psych  ##) Alcohol withdrawal: Her alcohol withdrawal history is extremely imprecise.  She does not appear to have any clinical signs or symptoms of alcohol withdrawal.  Suspect that she is vastly over stating the amount of alcohol that she consumes.  Suspect some degree of secondary gain.  Her labs do not point to any evidence of other volatile alcohols.  Her very very mild and small increased gap could be due to her chronic kidney disease.  The fact that her ethanol level is 0 despite endorsing drinking "a whole bottle" 40 minutes ago is concerning for secondary gain. - alcohol withdrawal protocol, only as needed benzos -P.o. thiamine, p.o. folate  ##) Acute on chronic kidney disease: At this time her creatinine baseline is unclear.  Suspect she might have some hypovolemia. -IV fluids  ##) Chronic hep C: -Outpatient hematology referral for treatment  ##) Psych: - Continue home fluoxetine 20 mg daily  ##) Hypertension: -Continue labetalol 100 mg twice daily -Continue hydrochlorothiazide 25 mg daily  FEN: -fluids: IVF - electrolytes: monitor and supp -nutirtion: regular diet   Prophy: enox  Dispo: pending alcohol withdrawal clearance  FULL CODE  Konrad Dolores Sritha Chauncey MD [facility]  If 7PM-7AM, please contact night-coverage [email or web address] Password [email or web address]  08/09/2017, 6:42 PM

## 2017-08-10 DIAGNOSIS — R450 Nervousness: Secondary | ICD-10-CM

## 2017-08-10 DIAGNOSIS — G4700 Insomnia, unspecified: Secondary | ICD-10-CM

## 2017-08-10 DIAGNOSIS — Z915 Personal history of self-harm: Secondary | ICD-10-CM

## 2017-08-10 DIAGNOSIS — F332 Major depressive disorder, recurrent severe without psychotic features: Secondary | ICD-10-CM

## 2017-08-10 DIAGNOSIS — F17210 Nicotine dependence, cigarettes, uncomplicated: Secondary | ICD-10-CM

## 2017-08-10 DIAGNOSIS — F419 Anxiety disorder, unspecified: Secondary | ICD-10-CM

## 2017-08-10 DIAGNOSIS — R45851 Suicidal ideations: Secondary | ICD-10-CM

## 2017-08-10 DIAGNOSIS — R443 Hallucinations, unspecified: Secondary | ICD-10-CM

## 2017-08-10 DIAGNOSIS — F10239 Alcohol dependence with withdrawal, unspecified: Secondary | ICD-10-CM

## 2017-08-10 LAB — CBC
HCT: 27 % — ABNORMAL LOW (ref 36.0–46.0)
Hemoglobin: 8 g/dL — ABNORMAL LOW (ref 12.0–15.0)
MCH: 23.4 pg — ABNORMAL LOW (ref 26.0–34.0)
MCHC: 29.6 g/dL — ABNORMAL LOW (ref 30.0–36.0)
MCV: 78.9 fL (ref 78.0–100.0)
Platelets: 333 10*3/uL (ref 150–400)
RBC: 3.42 MIL/uL — ABNORMAL LOW (ref 3.87–5.11)
RDW: 18 % — ABNORMAL HIGH (ref 11.5–15.5)
WBC: 6.7 10*3/uL (ref 4.0–10.5)

## 2017-08-10 LAB — COMPREHENSIVE METABOLIC PANEL
ALT: 13 U/L — ABNORMAL LOW (ref 14–54)
AST: 16 U/L (ref 15–41)
Albumin: 3 g/dL — ABNORMAL LOW (ref 3.5–5.0)
Alkaline Phosphatase: 74 U/L (ref 38–126)
Anion gap: 8 (ref 5–15)
BUN: 26 mg/dL — ABNORMAL HIGH (ref 6–20)
CO2: 19 mmol/L — ABNORMAL LOW (ref 22–32)
Calcium: 8.7 mg/dL — ABNORMAL LOW (ref 8.9–10.3)
Chloride: 115 mmol/L — ABNORMAL HIGH (ref 101–111)
Creatinine, Ser: 2.08 mg/dL — ABNORMAL HIGH (ref 0.44–1.00)
GFR calc Af Amer: 28 mL/min — ABNORMAL LOW (ref >60)
GFR calc non Af Amer: 25 mL/min — ABNORMAL LOW (ref >60)
Glucose, Bld: 97 mg/dL (ref 65–99)
Potassium: 3.8 mmol/L (ref 3.5–5.1)
Sodium: 142 mmol/L (ref 135–145)
Total Bilirubin: 0.2 mg/dL — ABNORMAL LOW (ref 0.3–1.2)
Total Protein: 6.2 g/dL — ABNORMAL LOW (ref 6.5–8.1)

## 2017-08-10 LAB — HIV ANTIBODY (ROUTINE TESTING W REFLEX): HIV Screen 4th Generation wRfx: NONREACTIVE

## 2017-08-10 MED ORDER — HYDROCHLOROTHIAZIDE 25 MG PO TABS
25.0000 mg | ORAL_TABLET | Freq: Every day | ORAL | Status: DC
  Administered 2017-08-10 – 2017-08-11 (×2): 25 mg via ORAL
  Filled 2017-08-10 (×2): qty 1

## 2017-08-10 MED ORDER — ENOXAPARIN SODIUM 30 MG/0.3ML ~~LOC~~ SOLN
30.0000 mg | SUBCUTANEOUS | Status: DC
  Administered 2017-08-10 – 2017-08-12 (×3): 30 mg via SUBCUTANEOUS
  Filled 2017-08-10 (×4): qty 0.3

## 2017-08-10 MED ORDER — LABETALOL HCL 100 MG PO TABS
100.0000 mg | ORAL_TABLET | Freq: Two times a day (BID) | ORAL | Status: DC
  Administered 2017-08-10 – 2017-08-12 (×6): 100 mg via ORAL
  Filled 2017-08-10 (×6): qty 1

## 2017-08-10 MED ORDER — PANTOPRAZOLE SODIUM 20 MG PO TBEC
20.0000 mg | DELAYED_RELEASE_TABLET | Freq: Every day | ORAL | Status: DC
  Administered 2017-08-10 – 2017-08-12 (×3): 20 mg via ORAL
  Filled 2017-08-10 (×4): qty 1

## 2017-08-10 NOTE — Plan of Care (Signed)
  Education: Knowledge of General Education information will improve 08/10/2017 0759 - Progressing by Myriam Forehand, RN

## 2017-08-10 NOTE — Care Management Note (Signed)
Case Management Note  Patient Details  Name: Sherry [name] MRN: [id number] Date of Birth: 08/05/1955  Subjective/Objective:                    Action/Plan:  Await pysch evaluation   Expected Discharge Date:  08/12/17               [facility]  [facility]  In-House Referral:     Discharge planning Services     Post Acute Care Choice:    Choice offered to:     DME Arranged:    DME Agency:     HH Arranged:    [place] Agency:     Status of Service:  In process, will continue to follow  If discussed at Long Length of Stay Meetings, dates discussed:    Additional Comments:  Marilu Favre, RN 08/10/2017, 11:29 AM

## 2017-08-10 NOTE — ED Notes (Signed)
Pt belongings along with inventory sheet sent upstairs with pt to give to RN on [address]. Pt had no valuables in security and no meds in pharmacy
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
## 2017-08-10 NOTE — Progress Notes (Signed)
PROGRESS NOTE    Sherry [name]  GGE:[id number] DOB: 1955-12-05 DOA: 08/09/2017 PCP: Henderson Baltimore, FNP   Brief Narrative: 62 y.o. female with medical history significant of chronic kidney disease unknown baseline, bipolar disorder, depression anxiety, hepatitis C who comes in for psychiatric evaluation and EtOH withdrawal.    Assessment & Plan:   Principal Problem:   Alcohol withdrawal ([place]) Active Problems:   Anxiety   Bipolar disorder ([place])   Chronic hepatitis C ([place])   Chronic kidney disease, stage III (moderate) ([place])   Essential hypertension   ##) Suicidal ideation: Currently patient reports suicidal ideation reports a remote history of suicide attempt.  She does have an intact plan. Advertising account executive precautions -Psychiatry consult  ##) Alcohol withdrawal: Mild -Continue alcohol withdrawal pathway -P.o. thiamine, p.o. folate  ##) Acute on chronic kidney disease: Creatinine appears to be going back to baseline on IV fluids  -IV fluids  ##) Chronic hep C: -Outpatient hepatology referral for treatment  ##) Psych: - Continue home fluoxetine 20 mg daily  ##) Hypertension: -Continue labetalol 100 mg twice daily -Continue hydrochlorothiazide 25 mg daily  FEN: -fluids: IVF - electrolytes: monitor and supp -nutirtion: regular diet   Prophy: enox  Dispo: pending alcohol withdrawal completion and placement  FULL CODE    Consultants:   Psychiatry  Procedures:   None   Antimicrobials: None   Subjective: Patient reports that she feels quite anxious.  She reports that this typically occurs when she has stopped drinking for greater than 24 hours.  She also reports some shakiness.  Objective: Vitals:   08/09/17 1126 08/09/17 1128 08/10/17 0040 08/10/17 0128  BP: 121/79  (!) 150/82 (!) 167/70  Pulse: 65  87 78  Resp: 18  16 17   Temp: 97.8 F (36.6 C)   98.3 F (36.8 C)  TempSrc: Oral   Oral  SpO2: 98%  99% 99%  Weight:  111.1 kg (245 lb)   100.9 kg (222 lb 7.1 oz)  Height:  5\' 2"  (1.575 m)  5\' 2"  (1.575 m)    Intake/Output Summary (Last 24 hours) at 08/10/2017 1253 Last data filed at 08/10/2017 0900 Gross per 24 hour  Intake 480 ml  Output 3 ml  Net 477 ml   Filed Weights   08/09/17 1128 08/10/17 0128  Weight: 111.1 kg (245 lb) 100.9 kg (222 lb 7.1 oz)    Examination:  General exam: Appears calm and comfortable, edentulous Respiratory system: Clear to auscultation. Respiratory effort normal. Cardiovascular system: Distant heart sounds no murmur, regular rate and rhythm Gastrointestinal system: Abdomen is nondistended, soft and nontender. No organomegaly or masses felt. Normal bowel sounds heard. Central nervous system: Alert and oriented. No focal neurological deficits. Extremities: Symmetric 5 x 5 power. Skin: No rashes on visible skin Psychiatry: Patient's judgment and insight are relatively intact.  She is alert and oriented x3.  She reports her mood is depressed.  She denies any hallucinations or delusions.      Data Reviewed: I have personally reviewed following labs and imaging studies  CBC: Recent Labs  Lab 08/09/17 1132 08/10/17 0605  WBC 6.9 6.7  HGB 9.1* 8.0*  HCT 30.4* 27.0*  MCV 78.1 78.9  PLT 369 465   Basic Metabolic Panel: Recent Labs  Lab 08/09/17 1132 08/10/17 0605  NA 139 142  K 3.6 3.8  CL 110 115*  CO2 13* 19*  GLUCOSE 114* 97  BUN 30* 26*  CREATININE 2.96* 2.08*  CALCIUM 9.4 8.7*   GFR: Estimated Creatinine  Clearance: 31.6 mL/min (A) (by C-G formula based on SCr of 2.08 mg/dL (H)). Liver Function Tests: Recent Labs  Lab 08/09/17 1132 08/10/17 0605  AST 21 16  ALT 15 13*  ALKPHOS 90 74  BILITOT 0.4 0.2*  PROT 7.3 6.2*  ALBUMIN 3.7 3.0*   No results for input(s): LIPASE, AMYLASE in the last 168 hours. No results for input(s): AMMONIA in the last 168 hours. Coagulation Profile: No results for input(s): INR, PROTIME in the last 168 hours. Cardiac Enzymes: No  results for input(s): CKTOTAL, CKMB, CKMBINDEX, TROPONINI in the last 168 hours. BNP (last 3 results) No results for input(s): PROBNP in the last 8760 hours. HbA1C: No results for input(s): HGBA1C in the last 72 hours. CBG: No results for input(s): GLUCAP in the last 168 hours. Lipid Profile: No results for input(s): CHOL, HDL, LDLCALC, TRIG, CHOLHDL, LDLDIRECT in the last 72 hours. Thyroid Function Tests: No results for input(s): TSH, T4TOTAL, FREET4, T3FREE, THYROIDAB in the last 72 hours. Anemia Panel: No results for input(s): VITAMINB12, FOLATE, FERRITIN, TIBC, IRON, RETICCTPCT in the last 72 hours. Sepsis Labs: No results for input(s): PROCALCITON, LATICACIDVEN in the last 168 hours.  No results found for this or any previous visit (from the past 240 hour(s)).       Radiology Studies: No results found.      Scheduled Meds: . enoxaparin (LOVENOX) injection  30 mg Subcutaneous Q24H  . FLUoxetine  20 mg Oral Daily  . hydrochlorothiazide  25 mg Oral Daily  . labetalol  100 mg Oral BID  . LORazepam  0-4 mg Intravenous Q6H   Or  . LORazepam  0-4 mg Oral Q6H  . [START ON 08/11/2017] LORazepam  0-4 mg Intravenous Q12H   Or  . [START ON 08/11/2017] LORazepam  0-4 mg Oral Q12H  . pantoprazole  20 mg Oral Daily  . sucralfate  1 g Oral TID WC & HS  . thiamine  100 mg Oral Daily   Or  . thiamine  100 mg Intravenous Daily   Continuous Infusions: . sodium chloride 1 mL (08/10/17 1020)     LOS: 1 day    Time spent: [place], MD [facility]  If 7PM-7AM, please contact night-coverage [email or web address] Password [facility] 08/10/2017, 12:53 PM

## 2017-08-11 DIAGNOSIS — N179 Acute kidney failure, unspecified: Secondary | ICD-10-CM

## 2017-08-11 DIAGNOSIS — B182 Chronic viral hepatitis C: Secondary | ICD-10-CM

## 2017-08-11 DIAGNOSIS — F10230 Alcohol dependence with withdrawal, uncomplicated: Secondary | ICD-10-CM

## 2017-08-11 LAB — URINALYSIS, ROUTINE W REFLEX MICROSCOPIC
Bilirubin Urine: NEGATIVE
Glucose, UA: NEGATIVE mg/dL
Ketones, ur: NEGATIVE mg/dL
Nitrite: NEGATIVE
Protein, ur: 30 mg/dL — AB
Specific Gravity, Urine: 1.009 (ref 1.005–1.030)
pH: 5 (ref 5.0–8.0)

## 2017-08-11 LAB — FERRITIN: Ferritin: 8 ng/mL — ABNORMAL LOW (ref 11–307)

## 2017-08-11 LAB — IRON AND TIBC
Iron: 23 ug/dL — ABNORMAL LOW (ref 28–170)
Saturation Ratios: 7 % — ABNORMAL LOW (ref 10.4–31.8)
TIBC: 323 ug/dL (ref 250–450)
UIBC: 300 ug/dL

## 2017-08-11 MED ORDER — FOLIC ACID 1 MG PO TABS
1.0000 mg | ORAL_TABLET | Freq: Every day | ORAL | Status: DC
  Administered 2017-08-11 – 2017-08-12 (×2): 1 mg via ORAL
  Filled 2017-08-11 (×2): qty 1

## 2017-08-11 MED ORDER — POTASSIUM CHLORIDE IN NACL 20-0.9 MEQ/L-% IV SOLN
INTRAVENOUS | Status: DC
  Administered 2017-08-11: 15:00:00 via INTRAVENOUS
  Filled 2017-08-11: qty 1000

## 2017-08-11 NOTE — Progress Notes (Signed)
PROGRESS NOTE    Sherry [name]  JQB:[id number] DOB: 08-Aug-1955 DOA: 08/09/2017 PCP: Henderson Baltimore, FNP   Brief Narrative:  Patient is a 62 y.o. y/o female with history of depression, anxiety, bipolar disorder, chronic kidney disease, presents to the ED with psychiatric evaluation for suicidal ideation.  She reports consuming alcohol, Listerine, pain pills, and reports having plan to slit her wrists.  She was hospitalized for similar psychiatric event in the past.  She reports use of crack cocaine about 4 months ago.  Denies hallucination, delusion, chest pain, nausea/vomitin/diarrhea, homicidal ideation. In the ED, vitals within normal limit, labs show BUN 30, creatinine 2.96 with baseline of 1.82. Tox screen is clean. She was admitted for alcohol withdrawal and psychiatric evaluation.   Assessment & Plan:   Principal Problem:   MDD (major depressive disorder), recurrent, severe, with psychosis ([place]) Active Problems:   Anxiety   Bipolar disorder ([place])   Chronic hepatitis C ([place])   Chronic kidney disease, stage III (moderate) (HCC)   Essential hypertension   Alcohol withdrawal ([place])   Major depressive disorder with recurrent severe psychosis -Patient reports suicidal ideation, having thought of cutting her wrists, feeling low, poor appetite -Discussion with Education officer, museum for placement to behavioral health facility tomorrow -Continue fluoxetine   Alcohol withdrawl -No clinical signs of alcohol withdrawal -Continue thiamine po supplement   Essential hypertension -BP stable, continue labetalol and HCTZ  Chronic kidney disease stage III -Creatine improves to 2.08 with baseline 1.82 -Continue hydration with IVF and closely monitor renal function with BMP   DVT prophylaxis: Lovenox Code Status:  Full  Family Communication:  No family at bedside Disposition Plan:  Behavioral health facility   Consultants:   None  Procedures: Antimicrobials:  Subjective: Patient  appears calm, alert, oriented. Denies fever/chill, dizziness, chest pain, cough, difficulty breathing, abdominal pain. She reports feeling low, no appetite, has suicidal ideation which she wants to cut her wrists. Also reports drink a pint or more vodka with beers.  Objective: Vitals:   08/10/17 0128 08/10/17 1500 08/10/17 2039 08/11/17 0515  BP: (!) 167/70 (!) 166/71 (!) 155/69 (!) 143/94  Pulse: 78 70 75 66  Resp: 17 20 20 20   Temp: 98.3 F (36.8 C) 98.2 F (36.8 C) 98.4 F (36.9 C) 97.6 F (36.4 C)  TempSrc: Oral Oral Oral Oral  SpO2: 99% 100% 100% 100%  Weight: 100.9 kg (222 lb 7.1 oz)     Height: 5\' 2"  (1.575 m)       Intake/Output Summary (Last 24 hours) at 08/11/2017 1245 Last data filed at 08/11/2017 4097 Gross per 24 hour  Intake 4255 ml  Output 800 ml  Net 3455 ml   Filed Weights   08/09/17 1128 08/10/17 0128  Weight: 111.1 kg (245 lb) 100.9 kg (222 lb 7.1 oz)    Examination:  General exam: Appears calm Respiratory system: Clear to auscultation. Respiratory effort normal. No wheezing or crackle Cardiovascular system: S1 & S2 heard, RRR.   Gastrointestinal system: Abdomen is nondistended, soft and nontender Central nervous system: Alert and oriented. No focal neurological deficits. Musculoskeletal/Extremities: No joint deformities  Skin: No rashes, lesions or ulcers. Dry and warm to touch Psychiatry: Express suicidal ideation. Reports feeling low, no appetite    Data Reviewed: I have personally reviewed following labs and imaging studies  CBC: Recent Labs  Lab 08/09/17 1132 08/10/17 0605  WBC 6.9 6.7  HGB 9.1* 8.0*  HCT 30.4* 27.0*  MCV 78.1 78.9  PLT 369 333  Basic Metabolic Panel: Recent Labs  Lab 08/09/17 1132 08/10/17 0605  NA 139 142  K 3.6 3.8  CL 110 115*  CO2 13* 19*  GLUCOSE 114* 97  BUN 30* 26*  CREATININE 2.96* 2.08*  CALCIUM 9.4 8.7*   GFR: Estimated Creatinine Clearance: 31.6 mL/min (A) (by C-G formula based on SCr of 2.08  mg/dL (H)). Liver Function Tests: Recent Labs  Lab 08/09/17 1132 08/10/17 0605  AST 21 16  ALT 15 13*  ALKPHOS 90 74  BILITOT 0.4 0.2*  PROT 7.3 6.2*  ALBUMIN 3.7 3.0*   No results for input(s): LIPASE, AMYLASE in the last 168 hours. No results for input(s): AMMONIA in the last 168 hours. Coagulation Profile: No results for input(s): INR, PROTIME in the last 168 hours. Cardiac Enzymes: No results for input(s): CKTOTAL, CKMB, CKMBINDEX, TROPONINI in the last 168 hours. BNP (last 3 results) No results for input(s): PROBNP in the last 8760 hours. HbA1C: No results for input(s): HGBA1C in the last 72 hours. CBG: No results for input(s): GLUCAP in the last 168 hours. Lipid Profile: No results for input(s): CHOL, HDL, LDLCALC, TRIG, CHOLHDL, LDLDIRECT in the last 72 hours. Thyroid Function Tests: No results for input(s): TSH, T4TOTAL, FREET4, T3FREE, THYROIDAB in the last 72 hours. Anemia Panel: No results for input(s): VITAMINB12, FOLATE, FERRITIN, TIBC, IRON, RETICCTPCT in the last 72 hours. Sepsis Labs: No results for input(s): PROCALCITON, LATICACIDVEN in the last 168 hours.  No results found for this or any previous visit (from the past 240 hour(s)).       Radiology Studies: No results found.      Scheduled Meds: . enoxaparin (LOVENOX) injection  30 mg Subcutaneous Q24H  . FLUoxetine  20 mg Oral Daily  . hydrochlorothiazide  25 mg Oral Daily  . labetalol  100 mg Oral BID  . LORazepam  0-4 mg Intravenous Q12H   Or  . LORazepam  0-4 mg Oral Q12H  . pantoprazole  20 mg Oral Daily  . sucralfate  1 g Oral TID WC & HS  . thiamine  100 mg Oral Daily   Or  . thiamine  100 mg Intravenous Daily   Continuous Infusions: . sodium chloride 150 mL/hr at 08/11/17 0745     LOS: 2 days    Jo-ku Elta [place]) [name], PA-S

## 2017-08-11 NOTE — Clinical Social Work Note (Signed)
Clinical Social Work Assessment  Patient Details  Name: Sherry [name] MRN: [id number] Date of Birth: [birth year]  Date of referral:  08/11/17               Reason for consult:  Facility Placement, Suicide Risk/Attempt, Discharge Planning, Mental Health Concerns                Permission sought to share information with:  Facility Sport and exercise psychologist, Family Supports Permission granted to share information::  Yes, Verbal Permission Granted  Name::     Donzetta Kohut  Agency::  [facility]  Relationship::  sister  Contact Information:  [phone]  Housing/Transportation Living arrangements for the past 2 months:  Single Family Home Source of Information:  Patient Patient Interpreter Needed:  None Criminal Activity/Legal Involvement Pertinent to Current Situation/Hospitalization:  No - Comment as needed Significant Relationships:  Siblings Lives with:  Self Do you feel safe going back to the place where you live?  No Need for family participation in patient care:  Yes (Comment)(safety and decision making)  Care giving concerns:  Pt lives alone and admits to using alcohol and drugs. Pt has a dx of MDD and reports suicidal ideation.    Social Worker assessment / plan:  CSW met with pt at bedside. Pt was guarded and flat in affect while speaking with CSW. Pt and CSW discussed the recommendation for inpatient behavioral health treatment for mental and emotional support as pt worked through her concerns and suicidal ideation. Pt had several questions about [facility] which the CSW answered. Pt states she will sign voluntary consent for placement as soon as she is medically stable to transfer. Pt states that her sister is okay to be contacted and that her information is correct on the chart. Pt states no further questions, states understanding that CSW will return with voluntary commitment form for pt to sign before discharge. CSW continuing to follow to support discharge to [facility] when pt is medically  appropriate for such.   Employment status:  Disabled (Comment on whether or not currently receiving Disability) Insurance information:  Managed Medicare PT Recommendations:  Not assessed at this time Information / Referral to community resources:  Inpatient Psychiatric Care (Comment Required)([facility])  Patient/Family's Response to care:  Pt states understanding of CSW role, [facility] recommendation and what [facility] is. Pt is accepting of transfer to [facility] and would like sister to be updated with any transfer information.   Patient/Family's Understanding of and Emotional Response to Diagnosis, Current Treatment, and Prognosis:  Pt states understanding of diagnosis, current treatment, prognosis, and recommendation for further care at [facility]. Pt is flat and sad in affect, answering questions appropriately and consenting to [facility] voluntary commitment form.   Emotional Assessment Appearance:  Appears stated age Attitude/Demeanor/Rapport:  Guarded(Flat) Affect (typically observed):  Calm, Guarded, Flat Orientation:  Oriented to Self, Oriented to Place, Oriented to  Time, Oriented to Situation Alcohol / Substance use:  Alcohol Use, Illicit Drugs, Tobacco Use Psych involvement (Current and /or in the community):  Yes (Comment), Outpatient Provider([facility])  Discharge Needs  Concerns to be addressed:  Mental Health Concerns, Care Coordination Readmission within the last 30 days:  No Current discharge risk:  Lives alone, Substance Abuse, Psychiatric Illness Barriers to Discharge:  Continued Medical Work up   [employer], [place] 08/11/2017, 2:30 PM

## 2017-08-11 NOTE — Social Work (Addendum)
CSW acknowledging psychiatry recommendation for inpatient behavioral health placement. CSW will make referral to [facility]   10:30am- CSW called by MD regarding pt being medically ready for d/c tomorrow.   11:07am- CSW able to reach [place] at [facility] and initiate referral process. Awaiting response.   11:21am- CSW spoke with [facility] intake liaison Ria Comment, pt will be able to be placed at [facility] when medically stable and iv fluids discontinued.  4:31pm- CSW received call from [facility] intake liaison Ria Comment. Since pt is not discharging today she will need CSW to follow up with Otila Kluver tomorrow if pt is discharged and medically stable.    CSW continuing to follow.   [place], [place] Work [phone]

## 2017-08-12 ENCOUNTER — Encounter (HOSPITAL_COMMUNITY): Payer: Self-pay

## 2017-08-12 ENCOUNTER — Other Ambulatory Visit: Payer: Self-pay

## 2017-08-12 ENCOUNTER — Inpatient Hospital Stay (HOSPITAL_COMMUNITY)
Admission: AD | Admit: 2017-08-12 | Discharge: 2017-08-25 | DRG: 885 | Disposition: A | Discharge status: Home / Self Care | Payer: Medicare HMO | Source: Intra-hospital | Attending: Psychiatry | Admitting: Psychiatry

## 2017-08-12 DIAGNOSIS — Z972 Presence of dental prosthetic device (complete) (partial): Secondary | ICD-10-CM

## 2017-08-12 DIAGNOSIS — N183 Chronic kidney disease, stage 3 (moderate): Secondary | ICD-10-CM | POA: Diagnosis present

## 2017-08-12 DIAGNOSIS — F333 Major depressive disorder, recurrent, severe with psychotic symptoms: Secondary | ICD-10-CM | POA: Diagnosis not present

## 2017-08-12 DIAGNOSIS — Z90710 Acquired absence of both cervix and uterus: Secondary | ICD-10-CM | POA: Diagnosis not present

## 2017-08-12 DIAGNOSIS — I10 Essential (primary) hypertension: Secondary | ICD-10-CM | POA: Diagnosis not present

## 2017-08-12 DIAGNOSIS — B1920 Unspecified viral hepatitis C without hepatic coma: Secondary | ICD-10-CM | POA: Diagnosis not present

## 2017-08-12 DIAGNOSIS — F1110 Opioid abuse, uncomplicated: Secondary | ICD-10-CM | POA: Diagnosis present

## 2017-08-12 DIAGNOSIS — I129 Hypertensive chronic kidney disease with stage 1 through stage 4 chronic kidney disease, or unspecified chronic kidney disease: Secondary | ICD-10-CM | POA: Diagnosis present

## 2017-08-12 DIAGNOSIS — R51 Headache: Secondary | ICD-10-CM | POA: Diagnosis not present

## 2017-08-12 DIAGNOSIS — F332 Major depressive disorder, recurrent severe without psychotic features: Secondary | ICD-10-CM | POA: Diagnosis present

## 2017-08-12 DIAGNOSIS — Z853 Personal history of malignant neoplasm of breast: Secondary | ICD-10-CM | POA: Diagnosis not present

## 2017-08-12 DIAGNOSIS — F1099 Alcohol use, unspecified with unspecified alcohol-induced disorder: Secondary | ICD-10-CM | POA: Diagnosis not present

## 2017-08-12 DIAGNOSIS — N390 Urinary tract infection, site not specified: Secondary | ICD-10-CM | POA: Diagnosis not present

## 2017-08-12 DIAGNOSIS — E669 Obesity, unspecified: Secondary | ICD-10-CM | POA: Diagnosis present

## 2017-08-12 DIAGNOSIS — Z888 Allergy status to other drugs, medicaments and biological substances status: Secondary | ICD-10-CM | POA: Diagnosis not present

## 2017-08-12 DIAGNOSIS — Z630 Problems in relationship with spouse or partner: Secondary | ICD-10-CM | POA: Diagnosis not present

## 2017-08-12 DIAGNOSIS — B182 Chronic viral hepatitis C: Secondary | ICD-10-CM | POA: Diagnosis present

## 2017-08-12 DIAGNOSIS — Z915 Personal history of self-harm: Secondary | ICD-10-CM | POA: Diagnosis not present

## 2017-08-12 DIAGNOSIS — G4700 Insomnia, unspecified: Secondary | ICD-10-CM | POA: Diagnosis not present

## 2017-08-12 DIAGNOSIS — Z880 Allergy status to penicillin: Secondary | ICD-10-CM | POA: Diagnosis not present

## 2017-08-12 DIAGNOSIS — F1994 Other psychoactive substance use, unspecified with psychoactive substance-induced mood disorder: Secondary | ICD-10-CM

## 2017-08-12 DIAGNOSIS — F329 Major depressive disorder, single episode, unspecified: Secondary | ICD-10-CM | POA: Diagnosis present

## 2017-08-12 DIAGNOSIS — Z6841 Body Mass Index (BMI) 40.0 and over, adult: Secondary | ICD-10-CM

## 2017-08-12 DIAGNOSIS — F10239 Alcohol dependence with withdrawal, unspecified: Secondary | ICD-10-CM | POA: Diagnosis not present

## 2017-08-12 DIAGNOSIS — N189 Chronic kidney disease, unspecified: Secondary | ICD-10-CM | POA: Diagnosis not present

## 2017-08-12 DIAGNOSIS — G2401 Drug induced subacute dyskinesia: Secondary | ICD-10-CM | POA: Diagnosis present

## 2017-08-12 DIAGNOSIS — F1010 Alcohol abuse, uncomplicated: Secondary | ICD-10-CM | POA: Diagnosis present

## 2017-08-12 DIAGNOSIS — F4010 Social phobia, unspecified: Secondary | ICD-10-CM | POA: Diagnosis not present

## 2017-08-12 DIAGNOSIS — Z9049 Acquired absence of other specified parts of digestive tract: Secondary | ICD-10-CM | POA: Diagnosis not present

## 2017-08-12 DIAGNOSIS — F17210 Nicotine dependence, cigarettes, uncomplicated: Secondary | ICD-10-CM | POA: Diagnosis present

## 2017-08-12 DIAGNOSIS — M549 Dorsalgia, unspecified: Secondary | ICD-10-CM | POA: Diagnosis not present

## 2017-08-12 DIAGNOSIS — R450 Nervousness: Secondary | ICD-10-CM | POA: Diagnosis not present

## 2017-08-12 DIAGNOSIS — R45851 Suicidal ideations: Secondary | ICD-10-CM | POA: Diagnosis present

## 2017-08-12 DIAGNOSIS — F10230 Alcohol dependence with withdrawal, uncomplicated: Secondary | ICD-10-CM | POA: Diagnosis not present

## 2017-08-12 DIAGNOSIS — J029 Acute pharyngitis, unspecified: Secondary | ICD-10-CM | POA: Diagnosis not present

## 2017-08-12 DIAGNOSIS — F419 Anxiety disorder, unspecified: Secondary | ICD-10-CM | POA: Diagnosis not present

## 2017-08-12 DIAGNOSIS — K219 Gastro-esophageal reflux disease without esophagitis: Secondary | ICD-10-CM | POA: Diagnosis not present

## 2017-08-12 DIAGNOSIS — Z736 Limitation of activities due to disability: Secondary | ICD-10-CM | POA: Diagnosis not present

## 2017-08-12 LAB — BASIC METABOLIC PANEL
Anion gap: 10 (ref 5–15)
BUN: 15 mg/dL (ref 6–20)
CO2: 19 mmol/L — ABNORMAL LOW (ref 22–32)
Calcium: 9.4 mg/dL (ref 8.9–10.3)
Chloride: 111 mmol/L (ref 101–111)
Creatinine, Ser: 1.44 mg/dL — ABNORMAL HIGH (ref 0.44–1.00)
GFR calc Af Amer: 44 mL/min — ABNORMAL LOW (ref >60)
GFR calc non Af Amer: 38 mL/min — ABNORMAL LOW (ref >60)
Glucose, Bld: 92 mg/dL (ref 65–99)
Potassium: 4 mmol/L (ref 3.5–5.1)
Sodium: 140 mmol/L (ref 135–145)

## 2017-08-12 MED ORDER — FOLIC ACID 1 MG PO TABS: 1.0000 mg | ORAL_TABLET | Freq: Every day | ORAL | Status: DC

## 2017-08-12 MED ORDER — VITAMIN B-1 100 MG PO TABS
100.0000 mg | ORAL_TABLET | Freq: Every day | ORAL | Status: DC
  Filled 2017-08-12 (×2): qty 1

## 2017-08-12 MED ORDER — TRAZODONE HCL 50 MG PO TABS
50.0000 mg | ORAL_TABLET | Freq: Every evening | ORAL | Status: DC | PRN
  Administered 2017-08-12 – 2017-08-13 (×2): 50 mg via ORAL
  Filled 2017-08-12 (×10): qty 1

## 2017-08-12 MED ORDER — LABETALOL HCL 100 MG PO TABS: 300.0000 mg | ORAL_TABLET | Freq: Two times a day (BID) | ORAL | Status: DC

## 2017-08-12 MED ORDER — CHLORDIAZEPOXIDE HCL 25 MG PO CAPS: 25.0000 mg | ORAL_CAPSULE | ORAL | Status: DC

## 2017-08-12 MED ORDER — FLUOXETINE HCL 20 MG PO CAPS
40.0000 mg | ORAL_CAPSULE | Freq: Every day | ORAL | Status: DC
  Administered 2017-08-13 – 2017-08-16 (×4): 40 mg via ORAL
  Filled 2017-08-12 (×6): qty 2

## 2017-08-12 MED ORDER — LABETALOL HCL 100 MG PO TABS
200.0000 mg | ORAL_TABLET | Freq: Once | ORAL | Status: AC
  Administered 2017-08-12: 200 mg via ORAL
  Filled 2017-08-12: qty 2

## 2017-08-12 MED ORDER — VITAMIN B-1 100 MG PO TABS
100.0000 mg | ORAL_TABLET | Freq: Every day | ORAL | Status: DC
  Administered 2017-08-13 – 2017-08-25 (×13): 100 mg via ORAL
  Filled 2017-08-12 (×16): qty 1

## 2017-08-12 MED ORDER — ONDANSETRON 4 MG PO TBDP: 4.0000 mg | ORAL_TABLET | Freq: Four times a day (QID) | ORAL | Status: AC | PRN

## 2017-08-12 MED ORDER — NICOTINE 21 MG/24HR TD PT24
21.0000 mg | MEDICATED_PATCH | Freq: Every day | TRANSDERMAL | Status: DC
  Administered 2017-08-13 – 2017-08-25 (×12): 21 mg via TRANSDERMAL
  Filled 2017-08-12 (×16): qty 1

## 2017-08-12 MED ORDER — HYDROCHLOROTHIAZIDE 25 MG PO TABS
25.0000 mg | ORAL_TABLET | Freq: Every day | ORAL | Status: DC
  Administered 2017-08-12 – 2017-08-23 (×12): 25 mg via ORAL
  Filled 2017-08-12 (×15): qty 1

## 2017-08-12 MED ORDER — THIAMINE HCL 100 MG PO TABS: 100.0000 mg | ORAL_TABLET | Freq: Every day | ORAL | Status: DC

## 2017-08-12 MED ORDER — CHLORDIAZEPOXIDE HCL 25 MG PO CAPS
25.0000 mg | ORAL_CAPSULE | Freq: Four times a day (QID) | ORAL | Status: DC
  Administered 2017-08-12 – 2017-08-13 (×2): 25 mg via ORAL
  Filled 2017-08-12 (×2): qty 1

## 2017-08-12 MED ORDER — HYDROXYZINE HCL 25 MG PO TABS
25.0000 mg | ORAL_TABLET | Freq: Four times a day (QID) | ORAL | Status: DC | PRN
  Administered 2017-08-12 – 2017-08-15 (×7): 25 mg via ORAL
  Filled 2017-08-12 (×7): qty 1

## 2017-08-12 MED ORDER — LOPERAMIDE HCL 2 MG PO CAPS: 2.0000 mg | ORAL_CAPSULE | ORAL | Status: AC | PRN

## 2017-08-12 MED ORDER — CHLORDIAZEPOXIDE HCL 25 MG PO CAPS: 25.0000 mg | ORAL_CAPSULE | Freq: Three times a day (TID) | ORAL | Status: DC

## 2017-08-12 MED ORDER — CHLORDIAZEPOXIDE HCL 25 MG PO CAPS
25.0000 mg | ORAL_CAPSULE | Freq: Four times a day (QID) | ORAL | Status: DC | PRN
  Administered 2017-08-12: 25 mg via ORAL
  Filled 2017-08-12: qty 1

## 2017-08-12 MED ORDER — THIAMINE HCL 100 MG/ML IJ SOLN: 100.0000 mg | Freq: Once | INTRAMUSCULAR | Status: DC

## 2017-08-12 MED ORDER — ADULT MULTIVITAMIN W/MINERALS CH
1.0000 | ORAL_TABLET | Freq: Every day | ORAL | Status: DC
  Administered 2017-08-12 – 2017-08-25 (×14): 1 via ORAL
  Filled 2017-08-12 (×18): qty 1

## 2017-08-12 MED ORDER — CHLORDIAZEPOXIDE HCL 25 MG PO CAPS
25.0000 mg | ORAL_CAPSULE | Freq: Four times a day (QID) | ORAL | Status: AC | PRN
  Administered 2017-08-12: 25 mg via ORAL
  Filled 2017-08-12: qty 1

## 2017-08-12 MED ORDER — CHLORDIAZEPOXIDE HCL 25 MG PO CAPS: 25.0000 mg | ORAL_CAPSULE | Freq: Every day | ORAL | Status: DC

## 2017-08-12 MED ORDER — LABETALOL HCL 100 MG PO TABS
100.0000 mg | ORAL_TABLET | Freq: Two times a day (BID) | ORAL | Status: DC
  Administered 2017-08-12 – 2017-08-19 (×14): 100 mg via ORAL
  Filled 2017-08-12 (×17): qty 1

## 2017-08-12 MED ORDER — ENOXAPARIN SODIUM 40 MG/0.4ML ~~LOC~~ SOLN: 40.0000 mg | SUBCUTANEOUS | Status: DC

## 2017-08-12 NOTE — Progress Notes (Signed)
Psychoeducational Group Note  Date:  08/12/2017 Time:  2045  Group Topic/Focus:  wrap up group  Participation Level: Did Not Attend  Participation Quality:  Not Applicable  Affect:  Not Applicable  Cognitive:  Not Applicable  Insight:  Not Applicable  Engagement in Group: Not Applicable  Additional Comments:  Pt was notified that group was beginning but remained in bed.   [name] 08/12/2017, 9:46 PM

## 2017-08-12 NOTE — Progress Notes (Signed)
Patient being transported via [place] transportation for transfer to [facility] for further treatment.    IV removed from [place] intact and no skin damage present.  All other skin intact with no issues.    Report called and given to [facility] at [facility].

## 2017-08-12 NOTE — Progress Notes (Signed)
Sherry [name] is a 62 year old female being admitted voluntarily to 307-2 from [facility] floor.  She was admitted to the medical floor for alcohol withdrawal.  She was medically cleared today.  She also came to the ED for depression and suicidal ideation.  During [facility] admission, she denies SI/HI or A/V hallucinations.  She will contract for safety on the unit.  She continues to report difficulty with alcohol withdrawal, feeling like skin is crawling, diarrhea and anxiety.  Oriented her to the unit.  Admission paperwork completed and signed.  Belongings searched and no locker needed on admission with no contraband found.  Skin assessment completed and no skin issues noted.  Q 15 minute checks initiated for safety.  We will continue to monitor the progress towards her goals.

## 2017-08-12 NOTE — Social Work (Addendum)
CSW spoke with MD and PA this morning, awaiting discharge summary and then will call [facility] regarding transfer.   11:20pm- CSW called referral to Tina at [facility]. Awaiting bed determination.   12:23pm- CSW confirmed that [facility] has bed, will need voluntary form faxed over to them prior to admission at 3pm.   1:00pm- CSW left message at pt sisters number requesting return call.   1:32pm- CSW met with pt at bedside, CSW verbally went over Voluntary Consent form, pt verbally stated understanding. Pt signed form and CSW faxed form to [facility] at [phone].   Clinical Social Worker facilitated patient discharge including contacting patient family and facility to confirm patient discharge plans.  Clinical information faxed to facility and family agreeable with plan.  CSW arranged ambulance transport via [place] transportation to [facility] Room 307-2, after 3:00pm.  RN to call [phone] with report prior to discharge, admitting MD is Dr. Cobos.  Clinical Social Worker will sign off for now as social work intervention is no longer needed. Please consult us again if new need arises.   H , LCSWA Clinical Social Worker    

## 2017-08-12 NOTE — Discharge Summary (Signed)
Physician Discharge Summary  Sherry [name] JZP:[id number] DOB: [birth year] DOA: 08/09/2017  PCP: Henderson Baltimore, FNP  Admit date: 08/09/2017 Discharge date: 08/12/2017  Admitted From:home Disposition:[facility]  Recommendations for Outpatient Follow-up:  1. Follow up with PCP in 1-2 weeks 2. Please obtain BMP/CBC in one week  Home Health: Going to behavioral health Hospital. Equipment/Devices: None Discharge Condition: Medically stable CODE STATUS: Full code Diet recommendation: Heart healthy  Brief/Interim Summary: 62 year old female with history of bipolar disorder, anxiety, depression, alcohol abuse, chronic hepatitis C, chronic kidney disease stage III presented with suicidal ideation and for alcohol withdrawal.  She was my pound to have acute kidney injury on chronic kidney disease stage III likely hemodynamically mediated.  She does not have any urinary symptoms.  Treated with IV fluid with improvement in serum creatinine level.  On reviewing patient's medical record, her serum creatinine level runs around 2.  She has no sign of alcohol withdrawal.  She still has a depression and suicidal ideation.  Evaluated by psychiatrist and patient is now going to behavioral health to continue her care.  Patient is medically stable on discharge.  She was found to have mildly elevated blood pressure because of lower dose of labetalol this morning.  Adjusted the dose to home dose.  Continue current medication.  Recommend to monitor blood pressure closely.  I have discussed with the social worker regarding discharge plan.   Discharge Diagnoses:  Principal Problem:   MDD (major depressive disorder), recurrent, severe, with psychosis ([place]) Active Problems:   Anxiety   Bipolar disorder ([place])   Chronic hepatitis C ([place])   Chronic kidney disease, stage III (moderate) ([place])   Essential hypertension   Alcohol withdrawal ([place])   AKI (acute kidney injury) [facility])    Discharge Instructions  Discharge  Instructions    Call MD for:  difficulty breathing, headache or visual disturbances   Complete by:  As directed    Call MD for:  extreme fatigue   Complete by:  As directed    Call MD for:  hives   Complete by:  As directed    Call MD for:  persistant dizziness or light-headedness   Complete by:  As directed    Call MD for:  persistant nausea and vomiting   Complete by:  As directed    Call MD for:  severe uncontrolled pain   Complete by:  As directed    Call MD for:  temperature >100.4   Complete by:  As directed    Diet - low sodium heart healthy   Complete by:  As directed    Discharge instructions   Complete by:  As directed    These follow-up with your PCP within a week after discharge from the hospital.   Increase activity slowly   Complete by:  As directed      Allergies as of 08/12/2017      Reactions   Flexeril [cyclobenzaprine]    Pt states Flexeril makes her feel depressed       Medication List    STOP taking these medications   omeprazole 20 MG capsule Commonly known as:  PRILOSEC     TAKE these medications   FLUoxetine 20 MG capsule Commonly known as:  PROZAC Take 20 mg by mouth daily.   folic acid 1 MG tablet Commonly known as:  FOLVITE Take 1 tablet (1 mg total) by mouth daily. Start taking on:  08/13/2017   hydrochlorothiazide 25 MG tablet Commonly known as:  HYDRODIURIL Take  25 mg by mouth daily.   labetalol 100 MG tablet Commonly known as:  NORMODYNE Take 3 tablets (300 mg total) by mouth 2 (two) times daily.   pantoprazole 20 MG tablet Commonly known as:  PROTONIX Take 1 tablet (20 mg total) by mouth daily.   sucralfate 1 g tablet Commonly known as:  CARAFATE Take 1 tablet (1 g total) by mouth 4 (four) times daily -  with meals and at bedtime.   thiamine 100 MG tablet Take 1 tablet (100 mg total) by mouth daily. Start taking on:  08/13/2017   zolpidem 10 MG tablet Commonly known as:  AMBIEN Take 10 mg by mouth at bedtime as needed.       Follow-up Information    Cousins, Gracy Bruins, FNP. Schedule an appointment as soon as possible for a visit in 1 week(s).   Specialty:  Nurse Practitioner Contact information: [place] [address] [phone]          Allergies  Allergen Reactions  . Flexeril [Cyclobenzaprine]     Pt states Flexeril makes her feel depressed     Consultations: Psychiatrist  Procedures/Studies: None  Subjective: Seen and examined at bedside.  Reported lower/depressed mood and suicidal ideation.  Overall not feeling good.  Denies chest pain, shortness of breath, nausea or vomiting.  Sitter at bedside.  Discharge Exam: Vitals:   08/12/17 0347 08/12/17 0600  BP: (!) 178/86 (!) 162/67  Pulse: 63 71  Resp: 20 (!) 22  Temp: 97.8 F (36.6 C) 98.4 F (36.9 C)  SpO2: 100% 98%   Vitals:   08/11/17 1357 08/11/17 2043 08/12/17 0347 08/12/17 0600  BP: 136/78 (!) 192/90 (!) 178/86 (!) 162/67  Pulse: 70 78 63 71  Resp: 18 (!) 26 20 (!) 22  Temp: 98.2 F (36.8 C) 98.6 F (37 C) 97.8 F (36.6 C) 98.4 F (36.9 C)  TempSrc: Oral Oral Oral Oral  SpO2:  96% 100% 98%  Weight:      Height:        General: Pt is alert, awake, not in acute distress Cardiovascular: RRR, S1/S2 +, no rubs, no gallops Respiratory: CTA bilaterally, no wheezing, no rhonchi Abdominal: Soft, NT, ND, bowel sounds + Extremities: no edema, no cyanosis Suicidal and depressed mood.   The results of significant diagnostics from this hospitalization (including imaging, microbiology, ancillary and laboratory) are listed below for reference.     Microbiology: No results found for this or any previous visit (from the past 240 hour(s)).   Labs: BNP (last 3 results) No results for input(s): BNP in the last 8760 hours. Basic Metabolic Panel: Recent Labs  Lab 08/09/17 1132 08/10/17 0605 08/12/17 0607  NA 139 142 140  K 3.6 3.8 4.0  CL 110 115* 111  CO2 13* 19* 19*  GLUCOSE 114* 97 92  BUN  30* 26* 15  CREATININE 2.96* 2.08* 1.44*  CALCIUM 9.4 8.7* 9.4   Liver Function Tests: Recent Labs  Lab 08/09/17 1132 08/10/17 0605  AST 21 16  ALT 15 13*  ALKPHOS 90 74  BILITOT 0.4 0.2*  PROT 7.3 6.2*  ALBUMIN 3.7 3.0*   No results for input(s): LIPASE, AMYLASE in the last 168 hours. No results for input(s): AMMONIA in the last 168 hours. CBC: Recent Labs  Lab 08/09/17 1132 08/10/17 0605  WBC 6.9 6.7  HGB 9.1* 8.0*  HCT 30.4* 27.0*  MCV 78.1 78.9  PLT 369 333   Cardiac Enzymes: No results for  input(s): CKTOTAL, CKMB, CKMBINDEX, TROPONINI in the last 168 hours. BNP: Invalid input(s): POCBNP CBG: No results for input(s): GLUCAP in the last 168 hours. D-Dimer No results for input(s): DDIMER in the last 72 hours. Hgb A1c No results for input(s): HGBA1C in the last 72 hours. Lipid Profile No results for input(s): CHOL, HDL, LDLCALC, TRIG, CHOLHDL, LDLDIRECT in the last 72 hours. Thyroid function studies No results for input(s): TSH, T4TOTAL, T3FREE, THYROIDAB in the last 72 hours.  Invalid input(s): FREET3 Anemia work up Recent Labs    08/11/17 1439  FERRITIN 8*  TIBC 323  IRON 23*   Urinalysis    Component Value Date/Time   COLORURINE YELLOW 08/11/2017 1656   APPEARANCEUR CLOUDY (A) 08/11/2017 1656   LABSPEC 1.009 08/11/2017 1656   PHURINE 5.0 08/11/2017 1656   GLUCOSEU NEGATIVE 08/11/2017 1656   HGBUR LARGE (A) 08/11/2017 1656   BILIRUBINUR NEGATIVE 08/11/2017 1656   KETONESUR NEGATIVE 08/11/2017 1656   PROTEINUR 30 (A) 08/11/2017 1656   UROBILINOGEN 0.2 01/12/2010 1941   NITRITE NEGATIVE 08/11/2017 1656   LEUKOCYTESUR TRACE (A) 08/11/2017 1656   Sepsis Labs Invalid input(s): PROCALCITONIN,  WBC,  LACTICIDVEN Microbiology No results found for this or any previous visit (from the past 240 hour(s)).   Time coordinating discharge: 26 minutes  SIGNED:   Rosita Fire, MD  [facility] 08/12/2017, 10:52 AM  If 7PM-7AM, please  contact night-coverage [email or web address] Password [email or web address]

## 2017-08-12 NOTE — Tx Team (Signed)
1610 Initial Treatment Plan 08/12/2017 7:20 PM [name] TCY:[id number]    PATIENT STRESSORS: Financial difficulties Health problems Substance abuse   PATIENT STRENGTHS: Capable of independent living Curator fund of knowledge Motivation for treatment/growth   PATIENT IDENTIFIED PROBLEMS: Depression  Suicidal ideation  Substance abuse  "Get better and be better"  "Quit drinking alcohol and doing drugs"             DISCHARGE CRITERIA:  Improved stabilization in mood, thinking, and/or behavior Verbal commitment to aftercare and medication compliance Withdrawal symptoms are absent or subacute and managed without 24-hour nursing intervention  PRELIMINARY DISCHARGE PLAN: Outpatient therapy Medication management  PATIENT/FAMILY INVOLVEMENT: This treatment plan has been presented to and reviewed with the patient, Sherry [name].  The patient and family have been given the opportunity to ask questions and make suggestions.  Windell Moment, RN 08/12/2017, 7:20 PM

## 2017-08-13 DIAGNOSIS — R45851 Suicidal ideations: Secondary | ICD-10-CM

## 2017-08-13 DIAGNOSIS — F10239 Alcohol dependence with withdrawal, unspecified: Secondary | ICD-10-CM

## 2017-08-13 DIAGNOSIS — F419 Anxiety disorder, unspecified: Secondary | ICD-10-CM

## 2017-08-13 DIAGNOSIS — F1994 Other psychoactive substance use, unspecified with psychoactive substance-induced mood disorder: Secondary | ICD-10-CM

## 2017-08-13 DIAGNOSIS — R450 Nervousness: Secondary | ICD-10-CM

## 2017-08-13 DIAGNOSIS — F1099 Alcohol use, unspecified with unspecified alcohol-induced disorder: Secondary | ICD-10-CM

## 2017-08-13 DIAGNOSIS — F4010 Social phobia, unspecified: Secondary | ICD-10-CM

## 2017-08-13 HISTORY — DX: Other psychoactive substance use, unspecified with psychoactive substance-induced mood disorder: F19.94

## 2017-08-13 MED ORDER — CHLORPROMAZINE HCL 10 MG PO TABS
10.0000 mg | ORAL_TABLET | Freq: Once | ORAL | Status: AC
  Administered 2017-08-13: 10 mg via ORAL
  Filled 2017-08-13 (×2): qty 1

## 2017-08-13 MED ORDER — GABAPENTIN 300 MG PO CAPS
300.0000 mg | ORAL_CAPSULE | Freq: Three times a day (TID) | ORAL | Status: DC
  Administered 2017-08-13 – 2017-08-23 (×28): 300 mg via ORAL
  Filled 2017-08-13 (×35): qty 1

## 2017-08-13 MED ORDER — GABAPENTIN 100 MG PO CAPS
100.0000 mg | ORAL_CAPSULE | Freq: Three times a day (TID) | ORAL | Status: DC
  Administered 2017-08-13: 100 mg via ORAL
  Filled 2017-08-13 (×7): qty 1

## 2017-08-13 NOTE — Progress Notes (Signed)
DAR NOTE: Pt present with flat affect and depressed mood in the unit. Pt has been isolating herself and has been bed most of the time. Pt complained of her skin crawling, withdrawing from alcohol and feeling dizzy. Pt kept asking for medication and keep on saying that she is not getting better. Pt advised to get up slow, fluids given to hydrate herself. Pt took all her meds as scheduled. Pt reports  a good night sleep, good appetite, normal energy, and poor concentration. Pt rate depression at 5, hopeless ness at 4, and anxiety at 6. Pt's safety ensured with 15 minute and environmental checks. Pt currently denies SI/HI and A/V hallucinations. Pt verbally agrees to seek staff if SI/HI or A/VH occurs and to consult with staff before acting on these thoughts. Will continue POC.

## 2017-08-13 NOTE — Progress Notes (Signed)
Patient ID: Sherry [name], female   DOB: 1956/05/28, 62 y.o.   MRN: [id number]  D: Patient alert and cooperative in dayroom on approach. Pt mood and affect appeared depressed and flat. Pt reports she is tolerating medication well but have racing thoughts and not able to sleep. Corene Cornea, NP notified. Pt denies SI/HI/AVH and pain. No acute distressed noted at this time.   A: Medications administered as prescribed. Support and encouragement provided. Pt encouraged to discuss feelings and come to staff with any question or concerns.   R: Patient remains safe and complaint with medications.

## 2017-08-13 NOTE — Progress Notes (Signed)
CSW left message for Brandon Melnick CDIOP requesting assessment appt per pt request.   Maxie Better, MSW, LCSW Clinical Social Worker 08/13/2017 2:15 PM

## 2017-08-13 NOTE — Tx Team (Signed)
Interdisciplinary Treatment and Diagnostic Plan Update  08/13/2017 Time of Session: 0830AM Sherry [name] MRN: [id number]  Principal Diagnosis: MDD, recurrent, severe, with psychosis  Secondary Diagnoses: Active Problems:   MDD (major depressive disorder), recurrent, severe, with psychosis ([place])   Current Medications:  Current Facility-Administered Medications  Medication Dose Route Frequency Provider Last Rate Last Dose  . chlordiazePOXIDE (LIBRIUM) capsule 25 mg  25 mg Oral Q6H PRN Money, Lowry Ram, FNP   25 mg at 08/12/17 2025  . chlordiazePOXIDE (LIBRIUM) capsule 25 mg  25 mg Oral QID Money, Lowry Ram, FNP   25 mg at 08/13/17 0830   Followed by  . [START ON 08/14/2017] chlordiazePOXIDE (LIBRIUM) capsule 25 mg  25 mg Oral TID Money, Lowry Ram, FNP       Followed by  . [START ON 08/15/2017] chlordiazePOXIDE (LIBRIUM) capsule 25 mg  25 mg Oral BH-qamhs Money, Lowry Ram, FNP       Followed by  . [START ON 08/16/2017] chlordiazePOXIDE (LIBRIUM) capsule 25 mg  25 mg Oral Daily Money, Lowry Ram, FNP      . FLUoxetine (PROZAC) capsule 40 mg  40 mg Oral Daily Money, Lowry Ram, FNP   40 mg at 08/13/17 0830  . hydrochlorothiazide (HYDRODIURIL) tablet 25 mg  25 mg Oral Daily Rankin, Shuvon B, NP   25 mg at 08/13/17 0830  . hydrOXYzine (ATARAX/VISTARIL) tablet 25 mg  25 mg Oral Q6H PRN Money, Lowry Ram, FNP   25 mg at 08/12/17 2207  . labetalol (NORMODYNE) tablet 100 mg  100 mg Oral BID Rankin, Shuvon B, NP   100 mg at 08/13/17 0830  . loperamide (IMODIUM) capsule 2-4 mg  2-4 mg Oral PRN Money, Lowry Ram, FNP      . multivitamin with minerals tablet 1 tablet  1 tablet Oral Daily Money, Lowry Ram, FNP   1 tablet at 08/13/17 0830  . nicotine (NICODERM CQ - dosed in mg/24 hours) patch 21 mg  21 mg Transdermal Daily Izediuno, Laruth Bouchard, MD   21 mg at 08/13/17 0831  . ondansetron (ZOFRAN-ODT) disintegrating tablet 4 mg  4 mg Oral Q6H PRN Money, Lowry Ram, FNP      . thiamine (VITAMIN B-1) tablet 100 mg  100 mg Oral  Daily Money, Travis B, FNP   100 mg at 08/13/17 0830  . traZODone (DESYREL) tablet 50 mg  50 mg Oral QHS,MR X 1 Lindon Romp A, NP   50 mg at 08/12/17 2207   PTA Medications: Medications Prior to Admission  Medication Sig Dispense Refill Last Dose  . FLUoxetine (PROZAC) 20 MG capsule Take 20 mg by mouth daily.   08/08/2017 at Unknown time  . folic acid (FOLVITE) 1 MG tablet Take 1 tablet (1 mg total) by mouth daily.     . hydrochlorothiazide (HYDRODIURIL) 25 MG tablet Take 25 mg by mouth daily.   08/09/2017 at Unknown time  . labetalol (NORMODYNE) 100 MG tablet Take 3 tablets (300 mg total) by mouth 2 (two) times daily.     . pantoprazole (PROTONIX) 20 MG tablet Take 1 tablet (20 mg total) by mouth daily. (Patient not taking: Reported on 08/09/2017) 14 tablet 0 Not Taking at Unknown time  . sucralfate (CARAFATE) 1 G tablet Take 1 tablet (1 g total) by mouth 4 (four) times daily -  with meals and at bedtime. (Patient not taking: Reported on 08/09/2017) 90 tablet 0 Not Taking at Unknown time  . thiamine 100 MG tablet Take 1 tablet (100 mg  total) by mouth daily.     Marland Kitchen zolpidem (AMBIEN) 10 MG tablet Take 10 mg by mouth at bedtime as needed.   unk    Patient Stressors: Financial difficulties Health problems Substance abuse  Patient Strengths: Capable of independent living Curator fund of knowledge Motivation for treatment/growth  Treatment Modalities: Medication Management, Group therapy, Case management,  1 to 1 session with clinician, Psychoeducation, Recreational therapy.   Physician Treatment Plan for Primary Diagnosis: MDD, recurrent, severe, with psychosis  Medication Management: Evaluate patient's response, side effects, and tolerance of medication regimen.  Therapeutic Interventions: 1 to 1 sessions, Unit Group sessions and Medication administration.  Evaluation of Outcomes: Not Met  Physician Treatment Plan for Secondary Diagnosis: Active Problems:   MDD  (major depressive disorder), recurrent, severe, with psychosis ([place])   Medication Management: Evaluate patient's response, side effects, and tolerance of medication regimen.  Therapeutic Interventions: 1 to 1 sessions, Unit Group sessions and Medication administration.  Evaluation of Outcomes: Not Met   RN Treatment Plan for Primary Diagnosis: MDD, recurrent, severe, with psychosis Long Term Goal(s): Knowledge of disease and therapeutic regimen to maintain health will improve  Short Term Goals: Ability to remain free from injury will improve, Ability to disclose and discuss suicidal ideas and Ability to identify and develop effective coping behaviors will improve  Medication Management: RN will administer medications as ordered by provider, will assess and evaluate patient's response and provide education to patient for prescribed medication. RN will report any adverse and/or side effects to prescribing provider.  Therapeutic Interventions: 1 on 1 counseling sessions, Psychoeducation, Medication administration, Evaluate responses to treatment, Monitor vital signs and CBGs as ordered, Perform/monitor CIWA, COWS, AIMS and Fall Risk screenings as ordered, Perform wound care treatments as ordered.  Evaluation of Outcomes: Not Met   LCSW Treatment Plan for Primary Diagnosis: MDD, recurrent, severe, with psychosis Long Term Goal(s): Safe transition to appropriate next level of care at discharge, Engage patient in therapeutic group addressing interpersonal concerns.  Short Term Goals: Engage patient in aftercare planning with referrals and resources, Facilitate patient progression through stages of change regarding substance use diagnoses and concerns and Identify triggers associated with mental health/substance abuse issues  Therapeutic Interventions: Assess for all discharge needs, 1 to 1 time with Social worker, Explore available resources and support systems, Assess for adequacy in community  support network, Educate family and significant other(s) on suicide prevention, Complete Psychosocial Assessment, Interpersonal group therapy.  Evaluation of Outcomes: Not Met   Progress in Treatment: Attending groups: No. New to unit. Continuing to assess.  Participating in groups: No. Taking medication as prescribed: Yes. Toleration medication: Yes. Family/Significant other contact made: No, will contact family member if patient consents. Patient understands diagnosis: Yes. Discussing patient identified problems/goals with staff: Yes. Medical problems stabilized or resolved: Yes. Denies suicidal/homicidal ideation: Yes. Issues/concerns per patient self-inventory: No. Other: n/a   New problem(s) identified: No, Describe:  n/a  New Short Term/Long Term Goal(s): detox, medication management for mood stabilization; elimination of SI thoughts; development of comprehensive mental wellness/sobriety plan.   Patient Goal: "to get better. I want therapy, medication, and coping skills."   Discharge Plan or Barriers: CSW assessing for appropriate referrals. Per pt, she goes to [facility] for medication management only.   Reason for Continuation of Hospitalization: Anxiety Depression Medication stabilization Suicidal ideation Withdrawal symptoms  Estimated Length of Stay: Tuesday, 08/17/17  Attendees: Patient: 08/13/2017 9:20 AM  Physician: Dr. Sanjuana Letters MD; Dr. Parke Poisson MD 08/13/2017 9:20  AM  Nursing: Rubin Payor; Chrys Racer RN 08/13/2017 9:20 AM  RN Care Manager: Lars Pinks CM 08/13/2017 9:20 AM  Social Worker: Maxie Better, LCSW 08/13/2017 9:20 AM  Recreational Therapist: x 08/13/2017 9:20 AM  Other: Lindell Spar NP; Darnelle Maffucci Money NP 08/13/2017 9:20 AM  Other:  08/13/2017 9:20 AM  Other: 08/13/2017 9:20 AM    Scribe for Treatment Team: [name] Smart, LCSW 08/13/2017 9:20 AM
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
## 2017-08-13 NOTE — BHH Counselor (Signed)
CSW attempted to complete the PSA with the patient, however the patient requested to attempt at another time due to her not feeling well.    CSW will continue to follow and complete later today.      Radonna Ricker, MSW, [place] Worker [facility]  Phone: [phone]

## 2017-08-13 NOTE — BHH Group Notes (Signed)
LCSW Group Therapy Note 08/13/2017 2:52 PM  Type of Therapy and Topic: Group Therapy: Feelings around Relapse and Recovery  Participation Level: Active   Description of Group:  Patients in this group will discuss emotions they experience before and after a relapse. They will process how experiencing these feelings, or avoidance of experiencing them, relates to having a relapse. Facilitator will guide patients to explore emotions they have related to recovery. Patients will be encouraged to process which emotions are more powerful. They will be guided to discuss the emotional reaction significant others in their lives may have to their relapse or recovery. Patients will be assisted in exploring ways to respond to the emotions of others without this contributing to a relapse.  Therapeutic Goals: 1. Patient will identify two or more emotions that lead to a relapse for them 2. Patient will identify two emotions that result when they relapse 3. Patient will identify two emotions related to recovery 4. Patient will demonstrate ability to communicate their needs through discussion and/or role plays  Summary of Patient Progress:  Sherry [name] attended the group during the last 20 minutes, however she participated and contributed to the group's discussion while there. Sherry [name] states that when she discharges from the hospital, she plans to become more honest with herself in order to prevent herself from relapsing in the future.   Therapeutic Modalities:  Cognitive Behavioral Therapy Solution-Focused Therapy Assertiveness Training Relapse Prevention Therapy   Theresa Duty Clinical Social Worker

## 2017-08-13 NOTE — BHH Suicide Risk Assessment (Signed)
[facility] Admission Suicide Risk Assessment   Nursing information obtained from:  Patient Demographic factors:  Living alone Current Mental Status:  Self-harm thoughts Loss Factors:  Financial problems / change in socioeconomic status Historical Factors:  NA Risk Reduction Factors:  NA  Total Time spent with patient: 45 minutes Principal Problem: MDD (major depressive disorder), recurrent severe, without psychosis ([place]) Diagnosis:   Patient Active Problem List   Diagnosis Date Noted  . AKI (acute kidney injury) ([place]) [N17.9]   . MDD (major depressive disorder), recurrent severe, without psychosis ([place]) [F33.2]   . Alcohol withdrawal ([place]) [F10.239] 08/09/2017  . Essential hypertension [I10] 07/06/2017  . Chronic kidney disease, stage III (moderate) ([place]) [N18.3] 11/17/2016  . Anxiety [F41.9] 08/21/2016  . Bipolar disorder ([place]) [F31.9] 01/31/2016  . History of breast cancer [Z85.3] 02/13/2013  . Obesity [E66.9] 02/13/2013  . Chronic hepatitis C ([place]) [B18.2] 12/17/2012   Subjective Data:   62 y.o AAF single, lives alone, no kids on SSI. Background history of SUD and mood disorder. Transferred from the medical floor where she was managed for acute on chronic kidney disease, alcohol and benzodiazepine withdrawals. Patient is reported to have been using substances on a daily basis. She has multiple medical issues that is exacerbated by use of substances. She has had multiple ER visits lately. Patient has been expressing suicidal thoughts.  Patient reports being stressed by medical issues and financial constraints. She tells me that use of substances has caused her a lot health issues. Says she has to quit or she would kill herself in the process. Patient wants to engage with care. Says she wants medication and hopefully get back in twelve step program. She has been in rehab on multiple occassions. She is not considering rehab at this time. Multiple past suicidal behavior, no family history of  suicide, no evidence of psychosis. No evidence of mania. Not pervasively depressed. No cognitive impairment. No access to weapons. She is cooperative with care. She has agreed to treatment recommendations. She has agreed to communicate suicidal thoughts to staff if the thoughts becomes overwhelming.       Continued Clinical Symptoms:  Alcohol Use Disorder Identification Test Final Score (AUDIT): 31 The "Alcohol Use Disorders Identification Test", Guidelines for Use in Primary Care, Second Edition.  World Pharmacologist [facility]). Score between 0-7:  no or low risk or alcohol related problems. Score between 8-15:  moderate risk of alcohol related problems. Score between 16-19:  high risk of alcohol related problems. Score 20 or above:  warrants further diagnostic evaluation for alcohol dependence and treatment.   CLINICAL FACTORS:   Alcohol/Substance Abuse/Dependencies   Musculoskeletal: Strength & Muscle Tone: within normal limits Gait & Station: normal Patient leans: N/A  Psychiatric Specialty Exam: Physical Exam  Constitutional: She appears well-developed and well-nourished.  HENT:  Head: Normocephalic and atraumatic.  Respiratory: Effort normal.  Neurological: She is alert.  Psychiatric:  As above    ROS  Blood pressure (!) 148/84, pulse 91, temperature 98.3 F (36.8 C), temperature source Oral, resp. rate 18, height 5\' 2"  (1.575 m), weight 99.3 kg (219 lb).Body mass index is 40.06 kg/m.  General Appearance: Poor grooming, tardive like oral movements. No tremors, not sweaty. Pleasant and engaged well.   Eye Contact:  Good  Speech:  Clear and Coherent and Normal Rate  Volume:  Normal  Mood:  worried  Affect:  Appropriate and Full Range  Thought Process:  Linear  Orientation:  Full (Time, Place, and Person)  Thought Content:  Future oriented. Less rumination about her past. No delusional theme. No preoccupation with violent thoughts. No obsession.  No hallucination in  any modality.   Suicidal Thoughts:  No current thoughts.  Homicidal Thoughts:  No  Memory:  Immediate;   Good Recent;   Fair Remote;   Fair  Judgement:  Fair  Insight:  Good  Psychomotor Activity:  Normal  Concentration:  Concentration: Good and Attention Span: Good  Recall:  [employer] of Knowledge:  Fair  Language:  Good  Akathisia:  Negative  Handed:    AIMS (if indicated):     Assets:  Communication Skills Desire for Improvement Financial Resources/Insurance Housing Resilience  ADL's:  Fair  Cognition:  WNL  Sleep:  Number of Hours: 3.5      COGNITIVE FEATURES THAT CONTRIBUTE TO RISK:  None    SUICIDE RISK:   Mild:  Suicidal ideation of limited frequency, intensity, duration, and specificity.  There are no identifiable plans, no associated intent, mild dysphoria and related symptoms, good self-control (both objective and subjective assessment), few other risk factors, and identifiable protective factors, including available and accessible social support.  PLAN OF CARE:  1. Continue Fluoxetine at current dose 2. Increase Gabapentin to 300 mg TID 3. Continue home medical medications at home dose 4. Q 15 minute check for suicide. 5. Monitor mood, behavior and interaction with peers 6. Continue to encourage unit groups and therapeutic activity 7. SW would gather collateral from her family 7.  W would facilitate inpatient rehab placement   I certify that inpatient services furnished can reasonably be expected to improve the patient's condition.   [place], MD 08/13/2017, 3:45 PM

## 2017-08-13 NOTE — BHH Group Notes (Signed)
[facility] Group Notes:  (Nursing/MHT/Case Management/Adjunct)  Date:  08/13/2017  Time:  6:40 PM   Type of Therapy:  Psychoeducational Skills  Participation Level:  Did Not Attend  Participation Quality:  DID NOT ATTEND  Affect:  DID NOT ATTEND  Cognitive:  DID NOT ATTEND  Insight:  None  Engagement in Group:  DID NOT ATTEND  Modes of Intervention:  DID NOT ATTEND  Summary of Progress/Problems: Pt did not attend patient self inventory group.   [name] 08/13/2017, 6:40 PM

## 2017-08-13 NOTE — Progress Notes (Signed)
Pt is new to the unit after spending a couple of days in the ED for severe withdrawal symptoms.  She also reports having passive suicidal thoughts most of the time.  She contracts for safety and denies HI/AVH at this time.  On first encounter with pt, she was lying in bed with her back to the door.  She seemed very restless, moving her legs back and forth.  The previous shift RN reported that the pt had missed her 1700 Librium, so writer took a dose of Librium 25 mg to her.  She requested blankets as she said she was cold.  Writer noticed the heater was blowing out cold air, so it was adjusted to the heat and she was given the blankets also.  A short time later, pt was out of bed at the med window asking for more meds.  Writer reviewed the meds that are available to her at this time.  Writer went to the provider and received order for Trazodone for sleep, but encouraged pt to discuss other med concerns with the doctor in the morning.  Pt has been medicated per orders according to her complaints.  Support and encouragement offered.  Discharge plans are in process.  Safety maintained with q15 minute checks.

## 2017-08-13 NOTE — Progress Notes (Signed)
Patient did attend the evening speaker AA meeting.  

## 2017-08-13 NOTE — Progress Notes (Signed)
Recreation Therapy Notes  Date:  08/13/17 Time: 0930 Location: [address] Dayroom  Group Topic: Stress Management  Goal Area(s) Addresses:  Patient will verbalize importance of using healthy stress management.  Patient will identify positive emotions associated with healthy stress management.   Intervention: Stress Managemnt  Activity :  Meditation.  LRT introduced the stress management technique of meditation.  LRT played a meditation on the stillness and resilience of mountains and how it can be used in mindfulness to ground and center you during the meditation.  Education:  Stress Management, Discharge Planning.   Education Outcome: Acknowledges edcuation/In group clarification offered/Needs additional education  Clinical Observations/Feedback: Pt did not attend group.     Victorino Sparrow, LRT/CTRS         Victorino Sparrow A 08/13/2017 11:37 AM

## 2017-08-13 NOTE — H&P (Signed)
Psychiatric Admission Assessment Adult  Patient Identification: Sherry [name] MRN:  [id number] Date of Evaluation:  08/13/2017 Chief Complaint:  BIPOLAR DISORDER  ALCOHOL USE DISORDER Principal Diagnosis: MDD (major depressive disorder), recurrent severe, without psychosis ([place]) Diagnosis:   Patient Active Problem List   Diagnosis Date Noted  . AKI (acute kidney injury) ([place]) [N17.9]   . MDD (major depressive disorder), recurrent severe, without psychosis ([place]) [F33.2]   . Alcohol withdrawal ([place]) [F10.239] 08/09/2017  . Essential hypertension [I10] 07/06/2017  . Chronic kidney disease, stage III (moderate) ([place]) [N18.3] 11/17/2016  . Anxiety [F41.9] 08/21/2016  . Bipolar disorder ([place]) [F31.9] 01/31/2016  . History of breast cancer [Z85.3] 02/13/2013  . Obesity [E66.9] 02/13/2013  . Chronic hepatitis C ([place]) [B18.2] 12/17/2012   History of Present Illness:  08/10/17 Psychiatric Consult: Per chart review, patient has a history of bipolar disorder, anxiety and alcohol abuse. She was admitted with alcohol withdrawal and endorsed SI with plan on admission. She has a history of suicide attempt. She was assessed by TTS on 1/28. She reported SI with a plan to cut her wrist. She reports abusing pain pills daily with alcohol use. She uses Vicodin and Morphine (10-15 pills daily). She reports CAH telling her to end her life and giving her instructions to cut her wrist. She is prescribed Prozac 20 mg daily. PMP indicates a prescription for Lunesta 2 mg (#60) last filled on 12/19. She has several pain medications and benzodiazepines prescriptions for a short supply filled from various providers. UDS was negative on admission.  On interview, Ms. Patient reports SI with negative thoughts. She has thoughts to cut her wrist. She last cut 30 years ago and required sutures and psychiatric hospitalization. She also reports CAH that tell her to harm self. She reports several stressors including financial  stressors. She reports onset of depression a year ago so she was started on Prozac 20 mg daily. Her depression improved until a month ago. She reports feelings of hopelessness and helplessness. She endorses poor sleep and poor appetite. Her nurse later reported that she has been sleeping most of the day. She also reports frequent worries. She denies HI and reports feeling safe in the hospital. She denies a history of manic symptoms (decreased need for sleep, increased energy and pressured speech). She reports heavy alcohol use for years. She has only been sober for up to a few days and has never received treatment for alcohol abuse.   Patient is seen today and confirms the above information. She also adds that she she is still suicidal but no plan,. She reports that she has a place to live off of [employer]. She is requesting residential treatment, but has private insurance. CSW will notify patient of options. Patient continues to state withdrawal symptoms, but she dose not present with any since on the unit and she was at the hospital for the last 3 days. Patient is continued on Prozac 40 mg Daily and the Librium Detox was discontinued except for Librium PRN for CIWA greater than 10.   Associated Signs/Symptoms: Depression Symptoms:  depressed mood, feelings of worthlessness/guilt, suicidal thoughts without plan, anxiety, loss of energy/fatigue, (Hypo) Manic Symptoms:  Impulsivity, Irritable Mood, Anxiety Symptoms:  Social Anxiety, Psychotic Symptoms:  Denies PTSD Symptoms: NA Total Time spent with patient: 45 minutes  Past Psychiatric History: Suicide attempt more than 30 years ago, Depression, Bipolar,   Is the patient at risk to self? Yes.    Has the patient been a risk to self  in the past 6 months? No.  Has the patient been a risk to self within the distant past? Yes.    Is the patient a risk to others? No.  Has the patient been a risk to others in the past 6 months? No.  Has the patient  been a risk to others within the distant past? No.   Prior Inpatient Therapy:   Prior Outpatient Therapy:    Alcohol Screening: 1. How often do you have a drink containing alcohol?: 4 or more times a week 2. How many drinks containing alcohol do you have on a typical day when you are drinking?: 10 or more 3. How often do you have six or more drinks on one occasion?: Daily or almost daily AUDIT-C Score: 12 4. How often during the last year have you found that you were not able to stop drinking once you had started?: Daily or almost daily 5. How often during the last year have you failed to do what was normally expected from you becasue of drinking?: Daily or almost daily 6. How often during the last year have you needed a first drink in the morning to get yourself going after a heavy drinking session?: Daily or almost daily 7. How often during the last year have you had a feeling of guilt of remorse after drinking?: Less than monthly 8. How often during the last year have you been unable to remember what happened the night before because you had been drinking?: Monthly 9. Have you or someone else been injured as a result of your drinking?: No 10. Has a relative or friend or a doctor or another health worker been concerned about your drinking or suggested you cut down?: Yes, during the last year Alcohol Use Disorder Identification Test Final Score (AUDIT): 31 Intervention/Follow-up: Alcohol Education Substance Abuse History in the last 12 months:  Yes.   Consequences of Substance Abuse: Medical Consequences:  reviewed Previous Psychotropic Medications: Yes  Psychological Evaluations: Yes  Past Medical History:  Past Medical History:  Diagnosis Date  . Cancer ([place])   . Chronic back pain   . Depression (emotion)   . Hepatitis C   . Hypertension   . Opioid dependence ([place])    in [place]    Past Surgical History:  Procedure Laterality Date  . ABDOMINAL HYSTERECTOMY    . BACK  SURGERY    . CHOLECYSTECTOMY    . MYOMECTOMY     Family History:  Family History  Family history unknown: Yes   Family Psychiatric  History: Denies  Tobacco Screening: Have you used any form of tobacco in the last 30 days? (Cigarettes, Smokeless Tobacco, Cigars, and/or Pipes): Yes Tobacco use, Select all that apply: 5 or more cigarettes per day Are you interested in Tobacco Cessation Medications?: Yes, will notify MD for an order Counseled patient on smoking cessation including recognizing danger situations, developing coping skills and basic information about quitting provided: Refused/Declined practical counseling Social History:  Social History   Substance and Sexual Activity  Alcohol Use No     Social History   Substance and Sexual Activity  Drug Use No    Additional Social History:                           Allergies:   Allergies  Allergen Reactions  . Flexeril [Cyclobenzaprine]     Pt states Flexeril makes her feel depressed   . Amoxicillin Rash  Lab Results:  Results for orders placed or performed during the hospital encounter of 08/09/17 (from the past 48 hour(s))  Iron and TIBC     Status: Abnormal   Collection Time: 08/11/17  2:39 PM  Result Value Ref Range   Iron 23 (L) 28 - 170 ug/dL   TIBC 323 250 - 450 ug/dL   Saturation Ratios 7 (L) 10.4 - 31.8 %   UIBC 300 ug/dL  Ferritin     Status: Abnormal   Collection Time: 08/11/17  2:39 PM  Result Value Ref Range   Ferritin 8 (L) 11 - 307 ng/mL  Urinalysis, Routine w reflex microscopic     Status: Abnormal   Collection Time: 08/11/17  4:56 PM  Result Value Ref Range   Color, Urine YELLOW YELLOW   APPearance CLOUDY (A) CLEAR   Specific Gravity, Urine 1.009 1.005 - 1.030   pH 5.0 5.0 - 8.0   Glucose, UA NEGATIVE NEGATIVE mg/dL   Hgb urine dipstick LARGE (A) NEGATIVE   Bilirubin Urine NEGATIVE NEGATIVE   Ketones, ur NEGATIVE NEGATIVE mg/dL   Protein, ur 30 (A) NEGATIVE mg/dL   Nitrite NEGATIVE  NEGATIVE   Leukocytes, UA TRACE (A) NEGATIVE   RBC / HPF 6-30 0 - 5 RBC/hpf   WBC, UA 0-5 0 - 5 WBC/hpf   Bacteria, UA MANY (A) NONE SEEN   Squamous Epithelial / LPF 0-5 (A) NONE SEEN   Mucus PRESENT    Amorphous Crystal PRESENT   Basic metabolic panel     Status: Abnormal   Collection Time: 08/12/17  6:07 AM  Result Value Ref Range   Sodium 140 135 - 145 mmol/L   Potassium 4.0 3.5 - 5.1 mmol/L   Chloride 111 101 - 111 mmol/L   CO2 19 (L) 22 - 32 mmol/L   Glucose, Bld 92 65 - 99 mg/dL   BUN 15 6 - 20 mg/dL   Creatinine, Ser 1.44 (H) 0.44 - 1.00 mg/dL   Calcium 9.4 8.9 - 10.3 mg/dL   GFR calc non Af Amer 38 (L) >60 mL/min   GFR calc Af Amer 44 (L) >60 mL/min    Comment: (NOTE) The eGFR has been calculated using the CKD EPI equation. This calculation has not been validated in all clinical situations. eGFR's persistently <60 mL/min signify possible Chronic Kidney Disease.    Anion gap 10 5 - 15    Blood Alcohol level:  Lab Results  Component Value Date   ETH <10 07/62/2633    Metabolic Disorder Labs:  No results found for: HGBA1C, MPG No results found for: PROLACTIN No results found for: CHOL, TRIG, HDL, CHOLHDL, VLDL, LDLCALC  Current Medications: Current Facility-Administered Medications  Medication Dose Route Frequency Provider Last Rate Last Dose  . chlordiazePOXIDE (LIBRIUM) capsule 25 mg  25 mg Oral Q6H PRN Lilliane Sposito, Lowry Ram, FNP   25 mg at 08/12/17 2025  . FLUoxetine (PROZAC) capsule 40 mg  40 mg Oral Daily Shelah Heatley B, FNP   40 mg at 08/13/17 0830  . gabapentin (NEURONTIN) capsule 100 mg  100 mg Oral TID Beckham Buxbaum, Lowry Ram, FNP   100 mg at 08/13/17 1148  . hydrochlorothiazide (HYDRODIURIL) tablet 25 mg  25 mg Oral Daily Rankin, Shuvon B, NP   25 mg at 08/13/17 0830  . hydrOXYzine (ATARAX/VISTARIL) tablet 25 mg  25 mg Oral Q6H PRN Versa Craton, Lowry Ram, FNP   25 mg at 08/13/17 1055  . labetalol (NORMODYNE) tablet 100 mg  100 mg Oral  BID Rankin, Shuvon B, NP   100 mg at  08/13/17 0830  . loperamide (IMODIUM) capsule 2-4 mg  2-4 mg Oral PRN Valincia Touch, Lowry Ram, FNP      . multivitamin with minerals tablet 1 tablet  1 tablet Oral Daily Royer Cristobal, Lowry Ram, FNP   1 tablet at 08/13/17 0830  . nicotine (NICODERM CQ - dosed in mg/24 hours) patch 21 mg  21 mg Transdermal Daily Izediuno, Laruth Bouchard, MD   21 mg at 08/13/17 0831  . ondansetron (ZOFRAN-ODT) disintegrating tablet 4 mg  4 mg Oral Q6H PRN Kaitlen Redford, Lowry Ram, FNP      . thiamine (VITAMIN B-1) tablet 100 mg  100 mg Oral Daily Roddrick Sharron B, FNP   100 mg at 08/13/17 0830  . traZODone (DESYREL) tablet 50 mg  50 mg Oral QHS,MR X 1 Lindon Romp A, NP   50 mg at 08/12/17 2207   PTA Medications: Medications Prior to Admission  Medication Sig Dispense Refill Last Dose  . FLUoxetine (PROZAC) 20 MG capsule Take 20 mg by mouth daily.   08/08/2017 at Unknown time  . folic acid (FOLVITE) 1 MG tablet Take 1 tablet (1 mg total) by mouth daily.     . hydrochlorothiazide (HYDRODIURIL) 25 MG tablet Take 25 mg by mouth daily.   08/09/2017 at Unknown time  . labetalol (NORMODYNE) 100 MG tablet Take 3 tablets (300 mg total) by mouth 2 (two) times daily.     . pantoprazole (PROTONIX) 20 MG tablet Take 1 tablet (20 mg total) by mouth daily. (Patient not taking: Reported on 08/09/2017) 14 tablet 0 Not Taking at Unknown time  . sucralfate (CARAFATE) 1 G tablet Take 1 tablet (1 g total) by mouth 4 (four) times daily -  with meals and at bedtime. (Patient not taking: Reported on 08/09/2017) 90 tablet 0 Not Taking at Unknown time  . thiamine 100 MG tablet Take 1 tablet (100 mg total) by mouth daily.     Marland Kitchen zolpidem (AMBIEN) 10 MG tablet Take 10 mg by mouth at bedtime as needed.   unk    Musculoskeletal: Strength & Muscle Tone: within normal limits Gait & Station: normal Patient leans: N/A  Psychiatric Specialty Exam: Physical Exam  Nursing note and vitals reviewed. Constitutional: She is oriented to person, place, and time. She appears  well-developed and well-nourished.  Cardiovascular: Normal rate.  Respiratory: Effort normal.  Musculoskeletal: Normal range of motion.  Neurological: She is alert and oriented to person, place, and time.  Skin: Skin is warm.    Review of Systems  Constitutional: Negative.   HENT: Negative.   Eyes: Negative.   Respiratory: Negative.   Cardiovascular: Negative.   Gastrointestinal: Negative.   Genitourinary: Negative.   Musculoskeletal: Negative.   Skin: Negative.   Neurological: Negative.   Endo/Heme/Allergies: Negative.   Psychiatric/Behavioral: Positive for depression, substance abuse and suicidal ideas. The patient is nervous/anxious.     Blood pressure (!) 148/84, pulse 91, temperature 98.3 F (36.8 C), temperature source Oral, resp. rate 18, height 5' 2"  (1.575 m), weight 99.3 kg (219 lb).Body mass index is 40.06 kg/m.  General Appearance: Casual  Eye Contact:  Good  Speech:  Clear and Coherent and Normal Rate  Volume:  Decreased  Mood:  Depressed  Affect:  Flat  Thought Process:  Goal Directed and Descriptions of Associations: Intact  Orientation:  Full (Time, Place, and Person)  Thought Content:  WDL  Suicidal Thoughts:  Yes.  without intent/plan  Homicidal Thoughts:  No  Memory:  Immediate;   Good Recent;   Good Remote;   Good  Judgement:  Fair  Insight:  Good  Psychomotor Activity:  Normal  Concentration:  Concentration: Good and Attention Span: Good  Recall:  Good  Fund of Knowledge:  Good  Language:  Good  Akathisia:  No  Handed:  Right  AIMS (if indicated):     Assets:  Communication Skills Desire for Improvement Financial Resources/Insurance Housing Physical Health Social Support Transportation  ADL's:  Intact  Cognition:  WNL  Sleep:  Number of Hours: 3.5    Treatment Plan Summary: Daily contact with patient to assess and evaluate symptoms and progress in treatment, Medication management and Plan is to:  -See SRA and MAR for medication  management -Encourage group therapy participation  Observation Level/Precautions:  15 minute checks  Laboratory:  Reviewed  Psychotherapy:  Group therapy  Medications:  See [facility]  Consultations:  As needed  Discharge Concerns:  Relapse  Estimated LOS: 3-5 Days  Other:  Admit to [facility] for Primary Diagnosis: MDD (major depressive disorder), recurrent severe, without psychosis ([place]) Long Term Goal(s): Improvement in symptoms so as ready for discharge  Short Term Goals: Ability to verbalize feelings will improve, Ability to disclose and discuss suicidal ideas and Ability to demonstrate self-control will improve  Physician Treatment Plan for Secondary Diagnosis: Principal Problem:   MDD (major depressive disorder), recurrent severe, without psychosis ([place])  Long Term Goal(s): Improvement in symptoms so as ready for discharge  Short Term Goals: Compliance with prescribed medications will improve and Ability to identify triggers associated with substance abuse/mental health issues will improve  I certify that inpatient services furnished can reasonably be expected to improve the patient's condition.    Lewis Shock, FNP 2/1/201912:36 PM

## 2017-08-13 NOTE — [facility] Suicide Risk Assessment (Signed)
[facility] INPATIENT:  Family/Significant Other Suicide Prevention Education  Suicide Prevention Education:  Contact Attempts: Sherry [name] (pt's sister) [phone] has been identified by the patient as the family member/significant other with whom the patient will be residing, and identified as the person(s) who will aid the patient in the event of a mental health crisis.  With written consent from the patient, two attempts were made to provide suicide prevention education, prior to and/or following the patient's discharge.  We were unsuccessful in providing suicide prevention education.  A suicide education pamphlet was given to the patient to share with family/significant other.  Date and time of first attempt: 08/13/17 at 2:10AM (voicemail left requesting call back at her earliest convenience).  Date and time of second attempt: completed by Theresa Duty 08/16/2017   Sherry [name] N Smart LCSW 08/13/2017, 2:15 PM

## 2017-08-14 DIAGNOSIS — F332 Major depressive disorder, recurrent severe without psychotic features: Principal | ICD-10-CM

## 2017-08-14 DIAGNOSIS — G4700 Insomnia, unspecified: Secondary | ICD-10-CM

## 2017-08-14 DIAGNOSIS — F17210 Nicotine dependence, cigarettes, uncomplicated: Secondary | ICD-10-CM

## 2017-08-14 DIAGNOSIS — Z630 Problems in relationship with spouse or partner: Secondary | ICD-10-CM

## 2017-08-14 MED ORDER — SULFAMETHOXAZOLE-TRIMETHOPRIM 400-80 MG PO TABS
1.0000 | ORAL_TABLET | Freq: Two times a day (BID) | ORAL | Status: DC
  Administered 2017-08-14 – 2017-08-17 (×6): 1 via ORAL
  Filled 2017-08-14 (×9): qty 1

## 2017-08-14 MED ORDER — TRAZODONE HCL 100 MG PO TABS: 100.0000 mg | ORAL_TABLET | Freq: Every evening | ORAL | Status: DC | PRN

## 2017-08-14 MED ORDER — MIRTAZAPINE 15 MG PO TABS
15.0000 mg | ORAL_TABLET | Freq: Every day | ORAL | Status: DC
  Filled 2017-08-14 (×2): qty 1

## 2017-08-14 MED ORDER — CHLORPROMAZINE HCL 10 MG PO TABS
10.0000 mg | ORAL_TABLET | Freq: Every day | ORAL | Status: DC
  Administered 2017-08-14 – 2017-08-15 (×2): 10 mg via ORAL
  Filled 2017-08-14 (×5): qty 1

## 2017-08-14 NOTE — Progress Notes (Signed)
[facility] MD Progress Note  08/14/2017 12:14 PM Sherry [name]  MRN:  [id number]   Subjective: Sherry [name] reports " I am not feeling well, I just found out that my partner has another girlfriend my sponsor told me"  Objective:  Sherry [name] is awake, alert and oriented. Reports " I am really stressed out and my blood pressure is up and I can't sleep. Reports racing thoughts  Denies suicidal or homicidal ideation. Denies auditory or visual hallucination and does not appear to be responding to internal stimuli.  Patient reports concerns with " sulfur odor and smelling urin."  Patient reports he is medication compliant without mediation side effects.   Report learning new coping skills. States her depression 8/10. Reports good appetite. States she is not  resting well at night. Reports she was given thorazine on last night and would like to continue this medication.-Np discussed medication with MD. Faythe Ghee to continue.  Support, encouragement and reassurance was provided.   Principal Problem: MDD (major depressive disorder), recurrent severe, without psychosis ([place]) Diagnosis:   Patient Active Problem List   Diagnosis Date Noted  . Substance induced mood disorder ([place]) [F19.94] 08/13/2017  . AKI (acute kidney injury) ([place]) [N17.9]   . MDD (major depressive disorder), recurrent severe, without psychosis ([place]) [F33.2]   . Alcohol withdrawal ([place]) [F10.239] 08/09/2017  . Essential hypertension [I10] 07/06/2017  . Chronic kidney disease, stage III (moderate) ([place]) [N18.3] 11/17/2016  . Anxiety [F41.9] 08/21/2016  . Bipolar disorder ([place]) [F31.9] 01/31/2016  . History of breast cancer [Z85.3] 02/13/2013  . Obesity [E66.9] 02/13/2013  . Chronic hepatitis C ([place]) [B18.2] 12/17/2012   Total Time spent with patient: 20 minutes  Past Psychiatric History:   Past Medical History:  Past Medical History:  Diagnosis Date  . Cancer ([place])   . Chronic back pain   . Depression (emotion)   . Hepatitis C   .  Hypertension   . Opioid dependence ([place])    in [place]    Past Surgical History:  Procedure Laterality Date  . ABDOMINAL HYSTERECTOMY    . BACK SURGERY    . CHOLECYSTECTOMY    . MYOMECTOMY     Family History:  Family History  Family history unknown: Yes   Family Psychiatric  History:  Social History:  Social History   Substance and Sexual Activity  Alcohol Use No     Social History   Substance and Sexual Activity  Drug Use No    Social History   Socioeconomic History  . Marital status: Single    Spouse name: None  . Number of children: None  . Years of education: None  . Highest education level: None  Social Needs  . Financial resource strain: None  . Food insecurity - worry: None  . Food insecurity - inability: None  . Transportation needs - medical: None  . Transportation needs - non-medical: None  Occupational History  . None  Tobacco Use  . Smoking status: Current Every Day Smoker    Packs/day: 1.00    Types: Cigarettes  . Smokeless tobacco: Never Used  Substance and Sexual Activity  . Alcohol use: No  . Drug use: No  . Sexual activity: None  Other Topics Concern  . None  Social History Narrative  . None   Additional Social History:                         Sleep: Fair  Appetite:  Fair  Current  Medications: Current Facility-Administered Medications  Medication Dose Route Frequency Provider Last Rate Last Dose  . chlordiazePOXIDE (LIBRIUM) capsule 25 mg  25 mg Oral Q6H PRN Money, Lowry Ram, FNP   25 mg at 08/12/17 2025  . FLUoxetine (PROZAC) capsule 40 mg  40 mg Oral Daily Money, Lowry Ram, FNP   40 mg at 08/14/17 0802  . gabapentin (NEURONTIN) capsule 300 mg  300 mg Oral TID Artist Beach, MD   300 mg at 08/14/17 1120  . hydrochlorothiazide (HYDRODIURIL) tablet 25 mg  25 mg Oral Daily Rankin, Shuvon B, NP   25 mg at 08/14/17 0806  . hydrOXYzine (ATARAX/VISTARIL) tablet 25 mg  25 mg Oral Q6H PRN Money, Lowry Ram, FNP   25 mg  at 08/14/17 1120  . labetalol (NORMODYNE) tablet 100 mg  100 mg Oral BID Rankin, Shuvon B, NP   100 mg at 08/14/17 0804  . loperamide (IMODIUM) capsule 2-4 mg  2-4 mg Oral PRN Money, Lowry Ram, FNP      . multivitamin with minerals tablet 1 tablet  1 tablet Oral Daily Money, Lowry Ram, FNP   1 tablet at 08/14/17 5284  . nicotine (NICODERM CQ - dosed in mg/24 hours) patch 21 mg  21 mg Transdermal Daily Izediuno, Laruth Bouchard, MD   21 mg at 08/14/17 0807  . ondansetron (ZOFRAN-ODT) disintegrating tablet 4 mg  4 mg Oral Q6H PRN Money, Lowry Ram, FNP      . thiamine (VITAMIN B-1) tablet 100 mg  100 mg Oral Daily Money, Lowry Ram, FNP   100 mg at 08/14/17 1324  . traZODone (DESYREL) tablet 50 mg  50 mg Oral QHS,MR X 1 Lindon Romp A, NP   50 mg at 08/13/17 2145    Lab Results: No results found for this or any previous visit (from the past 48 hour(s)).  Blood Alcohol level:  Lab Results  Component Value Date   ETH <10 40/04/2724    Metabolic Disorder Labs: No results found for: HGBA1C, MPG No results found for: PROLACTIN No results found for: CHOL, TRIG, HDL, CHOLHDL, VLDL, LDLCALC  Physical Findings: AIMS: Facial and Oral Movements Muscles of Facial Expression: None, normal Lips and Perioral Area: None, normal Jaw: None, normal Tongue: None, normal,Extremity Movements Upper (arms, wrists, hands, fingers): None, normal Lower (legs, knees, ankles, toes): None, normal, Trunk Movements Neck, shoulders, hips: None, normal, Overall Severity Severity of abnormal movements (highest score from questions above): None, normal Incapacitation due to abnormal movements: None, normal Patient's awareness of abnormal movements (rate only patient's report): No Awareness, Dental Status Current problems with teeth and/or dentures?: No Does patient usually wear dentures?: No  CIWA:  CIWA-Ar Total: 0 COWS:     Musculoskeletal: Strength & Muscle Tone: within normal limits Gait & Station: normal Patient  leans: N/A  Psychiatric Specialty Exam: Physical Exam  Vitals reviewed. Constitutional: She is oriented to person, place, and time.  Neurological: She is alert and oriented to person, place, and time.  Psychiatric: She has a normal mood and affect. Her behavior is normal.    Review of Systems  HENT:       Missing teeth, poor dentition   Genitourinary: Positive for frequency. Negative for dysuria and flank pain.       Reports odor to urin Urinalyses was reviewed - "many"bacteria in U/A- will initiated bactrim DS   Psychiatric/Behavioral: Positive for depression. Negative for hallucinations and suicidal ideas. The patient has insomnia.     Blood pressure 134/70, pulse  79, temperature 98 F (36.7 C), temperature source Oral, resp. rate 20, height 5\' 2"  (1.575 m), weight 99.3 kg (219 lb).Body mass index is 40.06 kg/m.  General Appearance: Casual  Eye Contact:  Fair  Speech:  Clear and Coherent  Volume:  Normal  Mood:  Anxious and Depressed  Affect:  Congruent  Thought Process:  Coherent  Orientation:  Full (Time, Place, and Person)  Thought Content:  Hallucinations: None  Suicidal Thoughts:  No  Homicidal Thoughts:  No  Memory:  Immediate;   Fair Recent;   Fair Remote;   Fair  Judgement:  Fair  Insight:  Lacking  Psychomotor Activity:  Normal  Concentration:  Concentration: Fair  Recall:  [employer] of Knowledge:  Fair  Language:  Fair  Akathisia:  No  Handed:  Right  AIMS (if indicated):     Assets:  Communication Skills Desire for Improvement Intimacy Resilience Social Support  ADL's:  Intact  Cognition:  WNL  Sleep:  Number of Hours: 5.75     Treatment Plan Summary: Daily contact with patient to assess and evaluate symptoms and progress in treatment and Medication management    Continue with Prozac 40 mg, Neurontin 300 mg PO TID  for mood stabilization.  Insomnia  discontinue with Trazodone 50 mg  Continue Thorazine 10 mg  - EKG pending  results  Continue  on CWIA/ Librium Protocol Will continue to monitor vitals ,medication compliance and treatment side effects while patient is here.  Reviewed labs: Initiated Bactrim DS or U/A symptoms ,BAL -, UDS - CSW will start working on disposition.  Patient to participate in therapeutic milieu  [place]ill [place], NP 08/14/2017, 12:14 PM

## 2017-08-14 NOTE — Progress Notes (Signed)
Patient ID: Sherry [name], female   DOB: March 30, 1956, 62 y.o.   MRN: [id number] D: Patient with blunted affect, depressed mood, reports her sleep quality last night as "fair", reports her appetite as good, states that her energy level is low, reports her concentration level as poor, rates her depression level today as "8" (10 being the highest), rates her hopelessness level today as "5" (10 being the highest), and her anxiety level as "5" (10 being the highest).  Pt denies SI/HI/AVH, denies having any physical problems, reports that her goal for today is "staying awake", and states that she intends to "stay out of room" to meet this goal.   A: Pt given all meds as scheduled, Q15 minute checks maintained for safety.  R: Pt denies having any current concerns, will continue to monitor.

## 2017-08-14 NOTE — BHH Group Notes (Signed)
LCSW Group Therapy Note  08/14/2017 9:30-10:30AM  Type of Therapy and Topic:  Group Therapy: Anger Cues and Responses  Participation Level:  Active   Description of Group:   In this group, patients learned how to recognize the physical, cognitive, emotional, and behavioral responses they have to anger-provoking situations.  They identified a recent time they became angry and how they reacted.  They analyzed how their reaction was possibly beneficial and how it was possibly unhelpful.  The group dicussed a variety of healthier coping skills that could help with such a situation in the future.  Deep breathing was practiced briefly.  Therapeutic Goals: 1. Patients will remember their last incident of anger and how they felt emotionally and physically, what their thoughts were at the time, and how they behaved. 2. Patients will identify how their behavior at that time worked for them, as well as how it worked against them. 3. Patients will explore possible new behaviors to use in future anger situations. 4. Patients will learn that anger itself is normal and cannot be eliminated, and that healthier reactions can assist with resolving conflict rather than worsening situations.  Summary of Patient Progress:  The patient shared that their most recent time of anger was this morning when her sponsor told her that her partner has started dating someone else.  She has since that time had the urge to just take a pill and go to sleep and forget.  She was reluctant, but able to process the advantages and disadvantages to doing so.  She said she would like to be in counseling when she leaves the hospital.  Therapeutic Modalities:   Cognitive Behavioral Therapy  Maretta Los  08/14/2017 12:42 PM

## 2017-08-14 NOTE — Progress Notes (Signed)
Patient ID: Sherry [name], female   DOB: 03/10/1956, 62 y.o.   MRN: [id number]  D: Patient alert and cooperative. Pt reports she received a phone call that her girlfriend of 18 years is seeing another woman. Pt reports she felt sad and suicidal but was able to put her "feeling on paper and let God take care of it".  Pt contracted for safety to come to staff. Pt denies HI/AVH and pain. No acute distressed noted at this time.   A: Medications administered as prescribed. Support and encouragement provided. Pt encouraged to discuss feelings and come to staff with any question or concerns.   R: Patient remains safe and complaint with medications.

## 2017-08-15 MED ORDER — HYDROXYZINE HCL 50 MG PO TABS
50.0000 mg | ORAL_TABLET | Freq: Four times a day (QID) | ORAL | Status: DC | PRN
  Administered 2017-08-15: 50 mg via ORAL

## 2017-08-15 MED ORDER — HYDROXYZINE HCL 50 MG PO TABS
50.0000 mg | ORAL_TABLET | Freq: Four times a day (QID) | ORAL | Status: AC | PRN
  Filled 2017-08-15: qty 1

## 2017-08-15 NOTE — Progress Notes (Signed)
[facility] MD Progress Note  08/15/2017 2:27 PM Sherry [name]  MRN:  [id number]   Subjective: Sherry [name] reports " I am feeling horrible today, my skin is crawling and I having a lot of anxiety" I want to stop taken gabapentin so that I don't withdrawal from them when I get home. Sherry [name] reports " If you take me off the gabapentin then will you start me on ultram?"   Objective:  Sherry [name] seen resting in dayroom, interacting with peers. Patient reports multiple somatic complaints during this assessment. States she would like to stop taken Gabapentin but not today, states will wasn't to stop taken this medication tomorrow.  Reports she is restless and having suicidal ideation that are intermittent.  Continues to reports racing thoughts.Denies auditory or visual hallucination and does not appear to be responding to internal stimuli. Patient is able to contract for safety while on the unit.  States her depression 10/10 today and feels that she needs something stronger. . Reports good appetite. States she is resting better with the thorazine.Support, encouragement and reassurance was provided.   Principal Problem: MDD (major depressive disorder), recurrent severe, without psychosis ([place]) Diagnosis:   Patient Active Problem List   Diagnosis Date Noted  . Substance induced mood disorder ([place]) [F19.94] 08/13/2017  . AKI (acute kidney injury) ([place]) [N17.9]   . MDD (major depressive disorder), recurrent severe, without psychosis ([place]) [F33.2]   . Alcohol withdrawal ([place]) [F10.239] 08/09/2017  . Essential hypertension [I10] 07/06/2017  . Chronic kidney disease, stage III (moderate) ([place]) [N18.3] 11/17/2016  . Anxiety [F41.9] 08/21/2016  . Bipolar disorder ([place]) [F31.9] 01/31/2016  . History of breast cancer [Z85.3] 02/13/2013  . Obesity [E66.9] 02/13/2013  . Chronic hepatitis C ([place]) [B18.2] 12/17/2012   Total Time spent with patient: 20 minutes  Past Psychiatric History:   Past Medical History:  Past Medical  History:  Diagnosis Date  . Cancer ([place])   . Chronic back pain   . Depression (emotion)   . Hepatitis C   . Hypertension   . Opioid dependence ([place])    in [place]    Past Surgical History:  Procedure Laterality Date  . ABDOMINAL HYSTERECTOMY    . BACK SURGERY    . CHOLECYSTECTOMY    . MYOMECTOMY     Family History:  Family History  Family history unknown: Yes   Family Psychiatric  History:  Social History:  Social History   Substance and Sexual Activity  Alcohol Use No     Social History   Substance and Sexual Activity  Drug Use No    Social History   Socioeconomic History  . Marital status: Single    Spouse name: None  . Number of children: None  . Years of education: None  . Highest education level: None  Social Needs  . Financial resource strain: None  . Food insecurity - worry: None  . Food insecurity - inability: None  . Transportation needs - medical: None  . Transportation needs - non-medical: None  Occupational History  . None  Tobacco Use  . Smoking status: Current Every Day Smoker    Packs/day: 1.00    Types: Cigarettes  . Smokeless tobacco: Never Used  Substance and Sexual Activity  . Alcohol use: No  . Drug use: No  . Sexual activity: None  Other Topics Concern  . None  Social History Narrative  . None   Additional Social History:  Sleep: Fair  Appetite:  Fair  Current Medications: Current Facility-Administered Medications  Medication Dose Route Frequency Provider Last Rate Last Dose  . chlordiazePOXIDE (LIBRIUM) capsule 25 mg  25 mg Oral Q6H PRN Money, Lowry Ram, FNP   25 mg at 08/12/17 2025  . chlorproMAZINE (THORAZINE) tablet 10 mg  10 mg Oral QHS Derrill Center, NP   10 mg at 08/14/17 2233  . FLUoxetine (PROZAC) capsule 40 mg  40 mg Oral Daily Money, Lowry Ram, FNP   40 mg at 08/15/17 1275  . gabapentin (NEURONTIN) capsule 300 mg  300 mg Oral TID Artist Beach, MD   300 mg at  08/15/17 1130  . hydrochlorothiazide (HYDRODIURIL) tablet 25 mg  25 mg Oral Daily Rankin, Shuvon B, NP   25 mg at 08/15/17 0825  . hydrOXYzine (ATARAX/VISTARIL) tablet 25 mg  25 mg Oral Q6H PRN Money, Lowry Ram, FNP   25 mg at 08/14/17 2233  . labetalol (NORMODYNE) tablet 100 mg  100 mg Oral BID Rankin, Shuvon B, NP   100 mg at 08/15/17 0825  . loperamide (IMODIUM) capsule 2-4 mg  2-4 mg Oral PRN Money, Lowry Ram, FNP      . multivitamin with minerals tablet 1 tablet  1 tablet Oral Daily Money, Lowry Ram, FNP   1 tablet at 08/15/17 [phone]  . nicotine (NICODERM CQ - dosed in mg/24 hours) patch 21 mg  21 mg Transdermal Daily Izediuno, Laruth Bouchard, MD   21 mg at 08/15/17 0827  . ondansetron (ZOFRAN-ODT) disintegrating tablet 4 mg  4 mg Oral Q6H PRN Money, Lowry Ram, FNP      . sulfamethoxazole-trimethoprim (BACTRIM,SEPTRA) 400-80 MG per tablet 1 tablet  1 tablet Oral Q12H Derrill Center, NP   1 tablet at 08/15/17 0825  . thiamine (VITAMIN B-1) tablet 100 mg  100 mg Oral Daily Money, Lowry Ram, FNP   100 mg at 08/15/17 0825    Lab Results: No results found for this or any previous visit (from the past 88 hour(s)).  Blood Alcohol level:  Lab Results  Component Value Date   ETH <10 17/49/4496    Metabolic Disorder Labs: No results found for: HGBA1C, MPG No results found for: PROLACTIN No results found for: CHOL, TRIG, HDL, CHOLHDL, VLDL, LDLCALC  Physical Findings: AIMS: Facial and Oral Movements Muscles of Facial Expression: None, normal Lips and Perioral Area: None, normal Jaw: None, normal Tongue: None, normal,Extremity Movements Upper (arms, wrists, hands, fingers): None, normal Lower (legs, knees, ankles, toes): None, normal, Trunk Movements Neck, shoulders, hips: None, normal, Overall Severity Severity of abnormal movements (highest score from questions above): None, normal Incapacitation due to abnormal movements: None, normal Patient's awareness of abnormal movements (rate only  patient's report): No Awareness, Dental Status Current problems with teeth and/or dentures?: No Does patient usually wear dentures?: No  CIWA:  CIWA-Ar Total: 2 COWS:     Musculoskeletal: Strength & Muscle Tone: within normal limits Gait & Station: normal Patient leans: N/A  Psychiatric Specialty Exam: Physical Exam  Nursing note and vitals reviewed. Constitutional: She is oriented to person, place, and time. She appears well-developed.  Cardiovascular: Normal rate.  Neurological: She is alert and oriented to person, place, and time.  Psychiatric: She has a normal mood and affect. Her behavior is normal.    Review of Systems  HENT:       Missing teeth, poor dentition   Genitourinary: Positive for frequency. Negative for dysuria and flank pain.  Reports odor to urin Urinalyses was reviewed - "many"bacteria in U/A- will initiated bactrim DS   Psychiatric/Behavioral: Positive for depression and suicidal ideas. Negative for hallucinations. The patient has insomnia.     Blood pressure 130/69, pulse 78, temperature 98.1 F (36.7 C), temperature source Oral, resp. rate 16, height 5\' 2"  (1.575 m), weight 99.3 kg (219 lb).Body mass index is 40.06 kg/m.  General Appearance: Casual, malodorous   Eye Contact:  Fair  Speech:  Clear and Coherent  Volume:  Normal  Mood:  Anxious and Depressed  Affect:  Congruent  Thought Process:  Coherent  Orientation:  Full (Time, Place, and Person)  Thought Content:  Hallucinations: None  Suicidal Thoughts:  No  Homicidal Thoughts:  No  Memory:  Immediate;   Fair Recent;   Fair Remote;   Fair  Judgement:  Fair  Insight:  Lacking  Psychomotor Activity:  Normal  Concentration:  Concentration: Fair  Recall:  [employer] of Knowledge:  Fair  Language:  Fair  Akathisia:  No  Handed:  Right  AIMS (if indicated):     Assets:  Communication Skills Desire for Improvement Intimacy Resilience Social Support  ADL's:  Intact  Cognition:  WNL   Sleep:  Number of Hours: 6.75     Treatment Plan Summary: Daily contact with patient to assess and evaluate symptoms and progress in treatment and Medication management   Continue with current treatment plan on  2/3/ 2019   Mood stabilization. Continue with Prozac 40 mg, Neurontin 300 mg PO TID  Anxiety:       Increased Vistaril 25 mg to 50 mg PO PRN Q6   Insomnia  discontinue with Trazodone 50 mg  Continue Thorazine 10 mg  - EKG pending results  Continue  on CWIA/ Librium Protocol Will continue to monitor vitals ,medication compliance and treatment side effects while patient is here.  Reviewed labs: - Continue Bactrim DS or U/A symptoms ,BAL -, UDS - CSW will start working on disposition.  Patient to participate in therapeutic milieu  Derrill Center, NP 08/15/2017, 2:27 PM

## 2017-08-15 NOTE — Progress Notes (Signed)
Patient did attend the evening speaker AA meeting.  

## 2017-08-15 NOTE — BHH Group Notes (Signed)
Adult Psychoeducational Group Note  Date:  08/15/2017 Time:  9:28 AM  Group Topic/Focus:  Goals Group:   The focus of this group is to help patients establish daily goals to achieve during treatment and discuss how the patient can incorporate goal setting into their daily lives to aide in recovery.  Participation Level:  Active  Participation Quality:  Appropriate  Affect:  Appropriate  Cognitive:  Alert  Insight: Appropriate  Engagement in Group:  Engaged  Modes of Intervention:  Discussion  Additional Comments: Pt was alert and engaged in group. Pt stated that she's not ready to leave [facility]. Pt feel that she is not ready. Pt goal is to communicate with the MD about her medication.  Huel Cote 08/15/2017, 9:28 AM

## 2017-08-15 NOTE — Progress Notes (Signed)
DAR NOTE: Patient presents with anxious affect and depressed mood.  Pt has been present in the milieu interacting with peers. Pt stated she feeling has not gotten the help she is looking for and would like stayed longer until she feels ready to be discharged. Pt has body odor, encouraged to take a shower. Denies pain, auditory and visual hallucinations.  Rates depression at 8, hopelessness at 8, and anxiety at 8.  Maintained on routine safety checks.  Medications given as prescribed.  Support and encouragement offered as needed.  Attended group and participated, will continue to monitor.

## 2017-08-15 NOTE — BHH Group Notes (Signed)
[facility] Group Notes:  (Nursing/MHT/Case Management/Adjunct)  Date:  08/15/2017  Time:  3:48 PM  Type of Therapy:  Psychoeducational Skills  Participation Level:  Active  Participation Quality:  Appropriate  Affect:  Appropriate  Cognitive:  Appropriate  Insight:  Appropriate  Engagement in Group:  Engaged  Modes of Intervention:  Problem-solving  Summary of Progress/Problems: Summary of Progress/Problems: Topic was on healthy support system.  Group encouraged to surround themselves with positive and healthy group/support system when changing to a healthy life style.    [name] 08/15/2017, 3:48 PM

## 2017-08-15 NOTE — Progress Notes (Signed)
Pt did not attend relaxation group.  

## 2017-08-15 NOTE — BHH Group Notes (Signed)
[facility] LCSW Group Therapy Note  Date/Time:  08/15/2017 10:00-11:00AM  Type of Therapy and Topic:  Group Therapy:  Healthy and Unhealthy Supports  Participation Level:  Active   Description of Group:  Patients in this group were introduced to the idea of adding a variety of healthy supports to address the various needs in their lives.Patients discussed what additional healthy supports could be helpful in their recovery and wellness after discharge in order to prevent future hospitalizations.   An emphasis was placed on using counselor, doctor, therapy groups, 12-step groups, and problem-specific support groups to expand supports.  They also worked as a group on developing a specific plan for several patients to deal with unhealthy supports through [place], psychoeducation with loved ones, and even termination of relationships.   Therapeutic Goals:   1)  discuss importance of adding supports to stay well once out of the hospital  2)  compare healthy versus unhealthy supports and identify some examples of each  3)  generate ideas and descriptions of healthy supports that can be added  4)  offer mutual support about how to address unhealthy supports  5)  encourage active participation in and adherence to discharge plan    Summary of Patient Progress:  The patient expressed a willingness to add a grief counselor to process her mother's death to help in her recovery journey.  She talked about already having the support of Narcotics Anonymous ("that's my world"), a sponsor, God, her family, and a cousin who is a Company secretary.   Therapeutic Modalities:   Motivational Interviewing Brief Solution-Focused Therapy  Selmer Dominion, LCSW

## 2017-08-16 DIAGNOSIS — I10 Essential (primary) hypertension: Secondary | ICD-10-CM

## 2017-08-16 DIAGNOSIS — N189 Chronic kidney disease, unspecified: Secondary | ICD-10-CM

## 2017-08-16 DIAGNOSIS — B1920 Unspecified viral hepatitis C without hepatic coma: Secondary | ICD-10-CM

## 2017-08-16 DIAGNOSIS — Z6841 Body Mass Index (BMI) 40.0 and over, adult: Secondary | ICD-10-CM

## 2017-08-16 DIAGNOSIS — E669 Obesity, unspecified: Secondary | ICD-10-CM

## 2017-08-16 MED ORDER — BENZTROPINE MESYLATE 0.5 MG PO TABS
0.5000 mg | ORAL_TABLET | Freq: Every day | ORAL | Status: DC
  Administered 2017-08-17: 0.5 mg via ORAL
  Filled 2017-08-16 (×3): qty 1

## 2017-08-16 MED ORDER — MAGNESIUM HYDROXIDE 400 MG/5ML PO SUSP
15.0000 mL | Freq: Every day | ORAL | Status: DC | PRN
  Administered 2017-08-16: 15 mL via ORAL
  Filled 2017-08-16: qty 30

## 2017-08-16 MED ORDER — CHLORPROMAZINE HCL 25 MG PO TABS
25.0000 mg | ORAL_TABLET | Freq: Every day | ORAL | Status: DC
  Administered 2017-08-16: 25 mg via ORAL
  Filled 2017-08-16 (×3): qty 1

## 2017-08-16 MED ORDER — HYDROXYZINE HCL 25 MG PO TABS
25.0000 mg | ORAL_TABLET | Freq: Four times a day (QID) | ORAL | Status: DC | PRN
Start: 2017-08-16 — End: 2017-08-17
  Administered 2017-08-16: 25 mg via ORAL
  Filled 2017-08-16: qty 1

## 2017-08-16 MED ORDER — FLUOXETINE HCL 20 MG PO CAPS
50.0000 mg | ORAL_CAPSULE | Freq: Every day | ORAL | Status: DC
  Administered 2017-08-17: 50 mg via ORAL
  Filled 2017-08-16 (×3): qty 1

## 2017-08-16 MED ORDER — ALUM & MAG HYDROXIDE-SIMETH 200-200-20 MG/5ML PO SUSP
15.0000 mL | Freq: Four times a day (QID) | ORAL | Status: DC | PRN
  Administered 2017-08-16: 15 mL via ORAL
  Filled 2017-08-16: qty 30

## 2017-08-16 NOTE — Progress Notes (Signed)
Recreation Therapy Notes  Date: 08/16/17 Time: 0930 Location: 300 Hall Dayroom  Group Topic: Stress Management  Goal Area(s) Addresses:  Patient will verbalize importance of using healthy stress management.  Patient will identify positive emotions associated with healthy stress management.   Intervention: Stress Management  Activity :  Gratitude Meditation.  LRT played meditation from Calm app to allow patients to meditate on being grateful for the things, skills or people in their lives that have had a positive effect on their lives.  Education: Stress Management, Discharge Planning.   Education Outcome: Acknowledges edcuation/In group clarification offered/Needs additional education  Clinical Observations/Feedback: Pt did not attend group.    Aneesah Hernan Linday, LRT/CTRS         Victorino Sparrow A 08/16/2017 12:31 PM

## 2017-08-16 NOTE — Progress Notes (Signed)
D: Pt passive SI-contract . Pt is pleasant and cooperative. Pt visible on unit most of the evening.   A: Pt was offered support and encouragement. Pt was given scheduled medications. Pt was encourage to attend groups. Q 15 minute checks were done for safety.   R:Pt attends groups and interacts well with peers and staff. Pt is taking medication. Pt has no complaints.Pt receptive to treatment and safety maintained on unit.

## 2017-08-16 NOTE — BHH Suicide Risk Assessment (Signed)
[place] INPATIENT:  Family/Significant Other Suicide Prevention Education  Suicide Prevention Education:  Contact Attempts: Donzetta Kohut (sister, [phone]) as been identified by the patient as the family member/significant other with whom the patient will be residing, and identified as the person(s) who will aid the patient in the event of a mental health crisis.  With written consent from the patient, two attempts were made to provide suicide prevention education, prior to and/or following the patient's discharge.  We were unsuccessful in providing suicide prevention education.  A suicide education pamphlet was given to the patient to share with family/significant other.  Date and time of first attempt:08/14/17/10:00am Date and time of second attempt:08/16/17/12:00pm   SPE completed with patient, as patient refused to consent to family contact. SPI pamphlet provided to pt and pt was encouraged to share information with support network, ask questions, and talk about any concerns relating to SPE. Patient denies access to guns/firearms and verbalized understanding of information provided. Mobile Crisis information also provided to patient.    Marylee Floras 08/16/2017, 12:10 PM

## 2017-08-16 NOTE — Progress Notes (Signed)
D: Pt  SI- contracts denies HI/AVH. Pt is pleasant and cooperative. Pt visible on milieu this evening  A: Pt was offered support and encouragement. Pt was given scheduled medications. Pt was encourage to attend groups. Q 15 minute checks were done for safety.   R:Pt attends groups and interacts well with peers and staff. Pt is taking medication. Pt has no complaints.Pt receptive to treatment and safety maintained on unit.

## 2017-08-16 NOTE — BHH Group Notes (Signed)
[facility] Mental Health Association Group Therapy 08/16/2017 1:15pm  Type of Therapy: Mental Health Association Presentation  Participation Level: Invited. DID NOT ATTEND. Pt chose to remain in bed.   Summary of Progress/Problems: [facility]) Speaker came to talk about his personal journey with mental health. The pt processed ways by which to relate to the speaker. [place] speaker provided handouts and educational information pertaining to groups and services offered by the [facility]. Pt was engaged in speaker's presentation and was receptive to resources provided.    [employer], LCSW 08/16/2017 2:00 PM

## 2017-08-16 NOTE — Plan of Care (Addendum)
  Activity: Interest or engagement in activities will improve 08/16/2017 2340 - Not Progressing by Providence Crosby, RN Note Pt seen in dayroom interacting   Safety: Periods of time without injury will increase 08/16/2017 2340 - Progressing by Providence Crosby, RN Note Pt safe on unit

## 2017-08-16 NOTE — Progress Notes (Signed)
[facility] MD Progress Note  08/16/2017 12:22 PM Joan Herschberger  MRN:  [id number] Subjective:   62 y.o AAF single, lives alone, no kids on SSI. Background history of SUD and mood disorder. Transferred from the medical floor where she was managed for acute on chronic kidney disease, alcohol and benzodiazepine withdrawals. Patient is reported to have been using substances on a daily basis. She has multiple medical issues that is exacerbated by use of substances. She has had multiple ER visits lately. Patient has been expressing suicidal thoughts.  Patient reports being stressed by medical issues and financial constraints. She tells me that use of substances has caused her a lot health issues.  Chart reviewed today. Patient discussed at team  Staff reports that she has limited engagement with unit activities. She is focused on having more hospital days. She is overwhelmed by prospects of discharge. She has voiced intermittent suicidal thoughts. No internal stimulation. No behavioral issue.   Seen today. Was in group just prior to interview. Very somatically focused. Feels she has to be here for more days. Says she has been ruminating a lot. Very vague on what she has been ruminating on. Says her family is out of town and they have the key to her apartment. Says they have her check until she comes home. Vague suicidal thoughts. No specific plans. No evidence of mania. No evidence of psychosis.   Principal Problem: MDD (major depressive disorder), recurrent severe, without psychosis ([place]) Diagnosis:   Patient Active Problem List   Diagnosis Date Noted  . Substance induced mood disorder ([place]) [F19.94] 08/13/2017  . AKI (acute kidney injury) ([place]) [N17.9]   . MDD (major depressive disorder), recurrent severe, without psychosis ([place]) [F33.2]   . Alcohol withdrawal ([place]) [F10.239] 08/09/2017  . Essential hypertension [I10] 07/06/2017  . Chronic kidney disease, stage III (moderate) ([place]) [N18.3] 11/17/2016  .  Anxiety [F41.9] 08/21/2016  . Bipolar disorder ([place]) [F31.9] 01/31/2016  . History of breast cancer [Z85.3] 02/13/2013  . Obesity [E66.9] 02/13/2013  . Chronic hepatitis C ([place]) [B18.2] 12/17/2012   Total Time spent with patient: 20 minutes  Past Psychiatric History: As in H&P  Past Medical History:  Past Medical History:  Diagnosis Date  . Cancer ([place])   . Chronic back pain   . Depression (emotion)   . Hepatitis C   . Hypertension   . Opioid dependence ([place])    in [place]    Past Surgical History:  Procedure Laterality Date  . ABDOMINAL HYSTERECTOMY    . BACK SURGERY    . CHOLECYSTECTOMY    . MYOMECTOMY     Family History:  Family History  Family history unknown: Yes   Family Psychiatric  History: As in H&P Social History:  Social History   Substance and Sexual Activity  Alcohol Use No     Social History   Substance and Sexual Activity  Drug Use No    Social History   Socioeconomic History  . Marital status: Single    Spouse name: None  . Number of children: None  . Years of education: None  . Highest education level: None  Social Needs  . Financial resource strain: None  . Food insecurity - worry: None  . Food insecurity - inability: None  . Transportation needs - medical: None  . Transportation needs - non-medical: None  Occupational History  . None  Tobacco Use  . Smoking status: Current Every Day Smoker    Packs/day: 1.00    Types:  Cigarettes  . Smokeless tobacco: Never Used  Substance and Sexual Activity  . Alcohol use: No  . Drug use: No  . Sexual activity: None  Other Topics Concern  . None  Social History Narrative  . None   Additional Social History:    Sleep: Fair  Appetite:  Good  Current Medications: Current Facility-Administered Medications  Medication Dose Route Frequency Provider Last Rate Last Dose  . chlorproMAZINE (THORAZINE) tablet 10 mg  10 mg Oral QHS Derrill Center, NP   10 mg at 08/15/17 2205  .  FLUoxetine (PROZAC) capsule 40 mg  40 mg Oral Daily Money, Lowry Ram, FNP   40 mg at 08/16/17 0815  . gabapentin (NEURONTIN) capsule 300 mg  300 mg Oral TID Artist Beach, MD   300 mg at 08/16/17 1157  . hydrochlorothiazide (HYDRODIURIL) tablet 25 mg  25 mg Oral Daily Rankin, Shuvon B, NP   25 mg at 08/16/17 0816  . labetalol (NORMODYNE) tablet 100 mg  100 mg Oral BID Rankin, Shuvon B, NP   100 mg at 08/16/17 0816  . multivitamin with minerals tablet 1 tablet  1 tablet Oral Daily Money, Lowry Ram, FNP   1 tablet at 08/16/17 0816  . nicotine (NICODERM CQ - dosed in mg/24 hours) patch 21 mg  21 mg Transdermal Daily Audi Wettstein, Laruth Bouchard, MD   21 mg at 08/16/17 0817  . sulfamethoxazole-trimethoprim (BACTRIM,SEPTRA) 400-80 MG per tablet 1 tablet  1 tablet Oral Q12H Derrill Center, NP   1 tablet at 08/16/17 0815  . thiamine (VITAMIN B-1) tablet 100 mg  100 mg Oral Daily Money, Lowry Ram, FNP   100 mg at 08/16/17 8676    Lab Results: No results found for this or any previous visit (from the past 48 hour(s)).  Blood Alcohol level:  Lab Results  Component Value Date   ETH <10 19/50/9326    Metabolic Disorder Labs: No results found for: HGBA1C, MPG No results found for: PROLACTIN No results found for: CHOL, TRIG, HDL, CHOLHDL, VLDL, LDLCALC  Physical Findings: AIMS: Facial and Oral Movements Muscles of Facial Expression: None, normal Lips and Perioral Area: None, normal Jaw: None, normal Tongue: None, normal,Extremity Movements Upper (arms, wrists, hands, fingers): None, normal Lower (legs, knees, ankles, toes): None, normal, Trunk Movements Neck, shoulders, hips: None, normal, Overall Severity Severity of abnormal movements (highest score from questions above): None, normal Incapacitation due to abnormal movements: None, normal Patient's awareness of abnormal movements (rate only patient's report): No Awareness, Dental Status Current problems with teeth and/or dentures?: No Does patient  usually wear dentures?: No  CIWA:  CIWA-Ar Total: 2 COWS:     Musculoskeletal: Strength & Muscle Tone: within normal limits Gait & Station: normal Patient leans: N/A  Psychiatric Specialty Exam: Physical Exam  Constitutional: She appears well-developed and well-nourished.  HENT:  Head: Normocephalic and atraumatic.  Respiratory: Effort normal.  Neurological: She is alert.  Psychiatric:  As above.    ROS  Blood pressure (!) 166/85, pulse 68, temperature (!) 97.5 F (36.4 C), temperature source Oral, resp. rate 16, height 5\' 2"  (1.575 m), weight 99.3 kg (219 lb).Body mass index is 40.06 kg/m.  General Appearance: Poor grooming, worried.  Eye Contact:  Good  Speech:  Clear and Coherent and Normal Rate  Volume:  Normal  Mood:  Anxious  Affect:  Congruent  Thought Process:  Linear  Orientation:  Full (Time, Place, and Person)  Thought Content:  Very vague about her worries.  Suicidal Thoughts:  Yes.  without intent/plan  Homicidal Thoughts:  No  Memory:  Immediate;   Good Recent;   Fair Remote;   Good  Judgement:  Fair  Insight:  Fair  Psychomotor Activity:  Normal  Concentration:  Concentration: Good and Attention Span: Good  Recall:  Good  Fund of Knowledge:  Fair  Language:  Good  Akathisia:  Negative  Handed:    AIMS (if indicated):     Assets:  Agricultural consultant Housing Resilience  ADL's:  Impaired  Cognition:  WNL  Sleep:  Number of Hours: 5.5     Treatment Plan Summary:  Patient is very vague and continues to express need for current level of care. She wants her sleep medication adjusted and her antidepressants adjusted. We agreed on changes below. We would evaluate her further.   Psychiatric: Substance Induced Mood Disorder SUD  Medical: HTN CKD Obesity HCV  Psychosocial:  Financial constraints  Limited support Medical issues   PLAN: 1. Increase chlorpromazine to 25 mg HS 2. Increase Fluoxetine to 50 mg  daily 3. Encourage unit groups and therapeutic activities 3. Continue to monitor mood, behavior and interaction with peers  Artist Beach, MD 08/16/2017, 12:22 PM

## 2017-08-16 NOTE — Progress Notes (Signed)
DAR NOTE: Patient presents with anxious affect and depressed mood. Pt complained of her skin scrolling on her, pt kept on asking what medication she need to take, scheduled medication given but patient would dennie having taken medications.  Denies pain, auditory and visual hallucinations.  Rates depression at 8, hopelessness at 8, and anxiety at 8.  Maintained on routine safety checks.  Medications given as prescribed.  Support and encouragement offered as needed.  Attended group and participated. Patient observed socializing with peers in the dayroom.  Offered no complaint.

## 2017-08-16 NOTE — Plan of Care (Signed)
  Safety: Ability to remain free from injury will improve 08/16/2017 0110 - Progressing by Providence Crosby, RN Note Safe on the unit at this time

## 2017-08-16 NOTE — Progress Notes (Signed)
Adult Psychoeducational Group Note  Date:  08/16/2017 Time:  7:09 PM  Group Topic/Focus:  Wellness Toolbox:   The focus of this group is to discuss various aspects of wellness, balancing those aspects and exploring ways to increase the ability to experience wellness.  Patients will create a wellness toolbox for use upon discharge.  Participation Level:  Active  Participation Quality:  Appropriate  Affect:  Appropriate  Cognitive:  Alert and Appropriate  Insight: Appropriate and Good  Engagement in Group:  Engaged  Modes of Intervention:  Activity and Discussion  Additional Comments:  Pt did participate in group activity and discussion.  Dorreen Valiente R Kairi Harshbarger 08/16/2017, 7:09 PM

## 2017-08-17 DIAGNOSIS — G2401 Drug induced subacute dyskinesia: Secondary | ICD-10-CM

## 2017-08-17 DIAGNOSIS — N390 Urinary tract infection, site not specified: Secondary | ICD-10-CM

## 2017-08-17 MED ORDER — ALUM & MAG HYDROXIDE-SIMETH 200-200-20 MG/5ML PO SUSP
30.0000 mL | Freq: Four times a day (QID) | ORAL | Status: DC | PRN
  Administered 2017-08-18 – 2017-08-23 (×10): 30 mL via ORAL
  Filled 2017-08-17 (×10): qty 30

## 2017-08-17 MED ORDER — MIRTAZAPINE 7.5 MG PO TABS: 7.5000 mg | ORAL_TABLET | Freq: Every day | ORAL | Status: DC

## 2017-08-17 MED ORDER — MAGNESIUM HYDROXIDE 400 MG/5ML PO SUSP: 30.0000 mL | Freq: Every day | ORAL | Status: DC | PRN

## 2017-08-17 MED ORDER — DIPHENHYDRAMINE HCL 25 MG PO CAPS
50.0000 mg | ORAL_CAPSULE | Freq: Every evening | ORAL | Status: DC | PRN
  Administered 2017-08-17: 50 mg via ORAL
  Filled 2017-08-17: qty 2

## 2017-08-17 MED ORDER — MIRTAZAPINE 7.5 MG PO TABS
7.5000 mg | ORAL_TABLET | Freq: Every day | ORAL | Status: DC
  Administered 2017-08-17: 7.5 mg via ORAL
  Filled 2017-08-17 (×3): qty 1

## 2017-08-17 NOTE — BHH Group Notes (Signed)
LCSW Group Therapy Note   08/17/2017 1:15pm   Type of Therapy and Topic:  Group Therapy:  Overcoming Obstacles   Participation Level:  Active   Description of Group:    In this group patients will be encouraged to explore what they see as obstacles to their own wellness and recovery. They will be guided to discuss their thoughts, feelings, and behaviors related to these obstacles. The group will process together ways to cope with barriers, with attention given to specific choices patients can make. Each patient will be challenged to identify changes they are motivated to make in order to overcome their obstacles. This group will be process-oriented, with patients participating in exploration of their own experiences as well as giving and receiving support and challenge from other group members.   Therapeutic Goals: 1. Patient will identify personal and current obstacles as they relate to admission. 2. Patient will identify barriers that currently interfere with their wellness or overcoming obstacles.  3. Patient will identify feelings, thought process and behaviors related to these barriers. 4. Patient will identify two changes they are willing to make to overcome these obstacles:      Summary of Patient Progress   Sherry [name] was attentive and engaged during today's processing group. She shared that her biggest obstacle was "finding a medication that actually helps me with my depression." She explored that idea of pursuing ECT with CSW and the group. Patient was interested in learning more about this process and agreed to watch a YouTube video about how the procedure works. Sherry [name] continues to show progress in the group setting with improving insight.    Therapeutic Modalities:   Cognitive Behavioral Therapy Solution Focused Therapy Motivational Interviewing Relapse Prevention Therapy  [name] Smart, LCSW 08/17/2017 12:46 PM

## 2017-08-17 NOTE — Progress Notes (Signed)
Nurse called patient's sister, Sherry [name] phone [phone], per request.  Sister stated patient will live in a house by herself located on [address] [place].  Both sisters' names are on the deed.  Sister lives in another house a few minutes away.  Sister presently in another town with her daughter but she will return on Friday and can pick up patient and take her to her home.  Sister's husband in town and may be able to pick up patient but sister is not sure if he is able.

## 2017-08-17 NOTE — Progress Notes (Signed)
Patient stated she takes labetalol 300 mg twice a day for her BP.Marland Kitchen

## 2017-08-17 NOTE — Progress Notes (Signed)
D:  Patient denied SI and HI this morning, contracts for safety.  Denied A/V hallucinations. A:  Medications administered per MD orders.  Emotional support and encouragement given patient. R:  Safety maintained with 15 minute checks.

## 2017-08-17 NOTE — Progress Notes (Signed)
Nursing Progress Note: 7p-7a D: Pt currently presents with a depression/anxiety affect and behavior. Pt states "They took me off all my meds just about because my mouth movements. I don't think that is the reason. I was having suicidal thoughts earlier today because I just don't know what I am to be doing after discharge. I am not having suicidal thoughts now... just anxiety." Interacting appropriately with the milieu. Pt reports good sleep during the previous night with current medication regimen. Pt did attend wrap-up group.  A: Pt provided with medications per providers orders. Pt's labs and vitals were monitored throughout the night. Pt supported emotionally and encouraged to express concerns and questions. Pt educated on medications.  R: Pt's safety ensured with 15 minute and environmental checks. Pt currently denies SI, HI, and AVH. Pt verbally contracts to seek staff if SI,HI, or AVH occurs and to consult with staff before acting on any harmful thoughts. Will continue to monitor.

## 2017-08-17 NOTE — Plan of Care (Signed)
Nurse discussed depression, anxiety, coping skills with patient.  

## 2017-08-17 NOTE — Progress Notes (Signed)
[facility] MD Progress Note  08/17/2017 1:50 PM Sherry [name]  MRN:  [id number]   Subjective:  Patient reports that she is not in a good place today. She doesn't feel she can be safe if she is discharged and she wants to stay here until Friday when her family comes back from [place], [place]. She denies any current SI/HI/AVH and contracts for safety, she just doesn't want to be discharged. She provides her sister, Donzetta Kohut phone number for collateral at [phone].   Objective: Patient's chart and findings reviewed and discussed with treatment team. Patient presents in the day room and is pleasant and cooperative. She has a flat affect and appears depressed. She still has prominent Tardive dyskinesia. After discussing with MD and pharmacist will discontinue all psych medications except for Gabapentin. Will then discuss starting Lamictal as a mood stabilizer. Also stopped Bactrim as well. Is symptoms of UTI persist will have another UA done. Also patient has been accepted at IOP, but doesn't start until 08-23-17. Patient reports numerous failed trials of medications and that the possibility of ECT may be an option.  Principal Problem: MDD (major depressive disorder), recurrent severe, without psychosis ([place]) Diagnosis:   Patient Active Problem List   Diagnosis Date Noted  . Substance induced mood disorder ([place]) [F19.94] 08/13/2017  . AKI (acute kidney injury) ([place]) [N17.9]   . MDD (major depressive disorder), recurrent severe, without psychosis ([place]) [F33.2]   . Alcohol withdrawal ([place]) [F10.239] 08/09/2017  . Essential hypertension [I10] 07/06/2017  . Chronic kidney disease, stage III (moderate) ([place]) [N18.3] 11/17/2016  . Anxiety [F41.9] 08/21/2016  . Bipolar disorder ([place]) [F31.9] 01/31/2016  . History of breast cancer [Z85.3] 02/13/2013  . Obesity [E66.9] 02/13/2013  . Chronic hepatitis C ([place]) [B18.2] 12/17/2012   Total Time spent with patient: 25 minutes  Past Psychiatric History: See  H&P  Past Medical History:  Past Medical History:  Diagnosis Date  . Cancer ([place])   . Chronic back pain   . Depression (emotion)   . Hepatitis C   . Hypertension   . Opioid dependence ([place])    in [place]    Past Surgical History:  Procedure Laterality Date  . ABDOMINAL HYSTERECTOMY    . BACK SURGERY    . CHOLECYSTECTOMY    . MYOMECTOMY     Family History:  Family History  Family history unknown: Yes   Family Psychiatric  History: See H&P Social History:  Social History   Substance and Sexual Activity  Alcohol Use No     Social History   Substance and Sexual Activity  Drug Use No    Social History   Socioeconomic History  . Marital status: Single    Spouse name: None  . Number of children: None  . Years of education: None  . Highest education level: None  Social Needs  . Financial resource strain: None  . Food insecurity - worry: None  . Food insecurity - inability: None  . Transportation needs - medical: None  . Transportation needs - non-medical: None  Occupational History  . None  Tobacco Use  . Smoking status: Current Every Day Smoker    Packs/day: 1.00    Types: Cigarettes  . Smokeless tobacco: Never Used  Substance and Sexual Activity  . Alcohol use: No  . Drug use: No  . Sexual activity: None  Other Topics Concern  . None  Social History Narrative  . None   Additional Social History:  Sleep: Fair  Appetite:  Good  Current Medications: Current Facility-Administered Medications  Medication Dose Route Frequency Provider Last Rate Last Dose  . alum & mag hydroxide-simeth (MAALOX/MYLANTA) 200-200-20 MG/5ML suspension 30 mL  30 mL Oral Q6H PRN Patriciaann Clan E, PA-C      . diphenhydrAMINE (BENADRYL) capsule 50 mg  50 mg Oral QHS PRN Money, Lowry Ram, FNP      . gabapentin (NEURONTIN) capsule 300 mg  300 mg Oral TID Artist Beach, MD   300 mg at 08/17/17 1119  . hydrochlorothiazide (HYDRODIURIL)  tablet 25 mg  25 mg Oral Daily Rankin, Shuvon B, NP   25 mg at 08/17/17 0803  . labetalol (NORMODYNE) tablet 100 mg  100 mg Oral BID Rankin, Shuvon B, NP   100 mg at 08/17/17 0803  . magnesium hydroxide (MILK OF MAGNESIA) suspension 30 mL  30 mL Oral Daily PRN Laverle Hobby, PA-C      . multivitamin with minerals tablet 1 tablet  1 tablet Oral Daily Money, Lowry Ram, FNP   1 tablet at 08/17/17 0802  . nicotine (NICODERM CQ - dosed in mg/24 hours) patch 21 mg  21 mg Transdermal Daily Izediuno, Laruth Bouchard, MD   21 mg at 08/17/17 0802  . thiamine (VITAMIN B-1) tablet 100 mg  100 mg Oral Daily Money, Lowry Ram, FNP   100 mg at 08/17/17 9326    Lab Results: No results found for this or any previous visit (from the past 48 hour(s)).  Blood Alcohol level:  Lab Results  Component Value Date   ETH <10 71/24/5809    Metabolic Disorder Labs: No results found for: HGBA1C, MPG No results found for: PROLACTIN No results found for: CHOL, TRIG, HDL, CHOLHDL, VLDL, LDLCALC  Physical Findings: AIMS: Facial and Oral Movements Muscles of Facial Expression: None, normal Lips and Perioral Area: None, normal Jaw: None, normal Tongue: None, normal,Extremity Movements Upper (arms, wrists, hands, fingers): None, normal Lower (legs, knees, ankles, toes): None, normal, Trunk Movements Neck, shoulders, hips: None, normal, Overall Severity Severity of abnormal movements (highest score from questions above): None, normal Incapacitation due to abnormal movements: None, normal Patient's awareness of abnormal movements (rate only patient's report): No Awareness, Dental Status Current problems with teeth and/or dentures?: No Does patient usually wear dentures?: No  CIWA:  CIWA-Ar Total: 1 COWS:  COWS Total Score: 1  Musculoskeletal: Strength & Muscle Tone: within normal limits Gait & Station: normal Patient leans: N/A  Psychiatric Specialty Exam: Physical Exam  Nursing note and vitals  reviewed. Constitutional: She is oriented to person, place, and time. She appears well-developed and well-nourished.  Cardiovascular: Normal rate.  Respiratory: Effort normal.  Musculoskeletal: Normal range of motion.  Neurological: She is alert and oriented to person, place, and time.  Skin: Skin is warm.    Review of Systems  Constitutional: Negative.   HENT: Negative.   Eyes: Negative.   Respiratory: Negative.   Cardiovascular: Negative.   Gastrointestinal: Negative.   Genitourinary: Negative.   Musculoskeletal: Negative.   Skin: Negative.   Neurological: Negative.   Endo/Heme/Allergies: Negative.   Psychiatric/Behavioral: Positive for depression and suicidal ideas. The patient is nervous/anxious.     Blood pressure (!) 164/82, pulse 68, temperature (!) 97.5 F (36.4 C), temperature source Oral, resp. rate 12, height 5\' 2"  (1.575 m), weight 99.3 kg (219 lb).Body mass index is 40.06 kg/m.  General Appearance: Casual  Eye Contact:  Good  Speech:  Clear and Coherent and Normal Rate  Volume:  Decreased  Mood:  Depressed  Affect:  Depressed and Flat  Thought Process:  Goal Directed and Descriptions of Associations: Intact  Orientation:  Full (Time, Place, and Person)  Thought Content:  WDL  Suicidal Thoughts:  Yes.  without intent/plan  Homicidal Thoughts:  No  Memory:  Immediate;   Good Recent;   Good Remote;   Good  Judgement:  Fair  Insight:  Fair  Psychomotor Activity:  Normal  Concentration:  Concentration: Good and Attention Span: Good  Recall:  Good  Fund of Knowledge:  Good  Language:  Good  Akathisia:  No  Handed:  Right  AIMS (if indicated):     Assets:  Communication Skills Desire for Improvement Financial Resources/Insurance Housing Social Support  ADL's:  Intact  Cognition:  WNL  Sleep:  Number of Hours: 6.75   Problems Addressed: MDD severe SUD  Treatment Plan Summary: Daily contact with patient to assess and evaluate symptoms and progress in  treatment, Medication management and Plan is to:  -Stop Prozac -Stop Thorazine -Stop Cogentin -Continue Gabapentin 300 mg PO TID for pain and agitation -Encourage group therapy participation  Lewis Shock, FNP 08/17/2017, 1:50 PM  Agree with NP Progress Note

## 2017-08-18 DIAGNOSIS — M549 Dorsalgia, unspecified: Secondary | ICD-10-CM

## 2017-08-18 DIAGNOSIS — K219 Gastro-esophageal reflux disease without esophagitis: Secondary | ICD-10-CM

## 2017-08-18 DIAGNOSIS — Z736 Limitation of activities due to disability: Secondary | ICD-10-CM

## 2017-08-18 MED ORDER — MIRTAZAPINE 15 MG PO TABS
15.0000 mg | ORAL_TABLET | Freq: Every day | ORAL | Status: DC
  Administered 2017-08-18 – 2017-08-24 (×7): 15 mg via ORAL
  Filled 2017-08-18 (×12): qty 1

## 2017-08-18 MED ORDER — DIVALPROEX SODIUM ER 250 MG PO TB24
250.0000 mg | ORAL_TABLET | Freq: Every day | ORAL | Status: DC
  Administered 2017-08-18: 250 mg via ORAL
  Filled 2017-08-18 (×3): qty 1

## 2017-08-18 MED ORDER — PANTOPRAZOLE SODIUM 20 MG PO TBEC
20.0000 mg | DELAYED_RELEASE_TABLET | Freq: Every day | ORAL | Status: DC
  Administered 2017-08-18 – 2017-08-20 (×3): 20 mg via ORAL
  Filled 2017-08-18 (×6): qty 1

## 2017-08-18 NOTE — Progress Notes (Addendum)
Per MD, CSW requested that Dr. Weber Cooks review pt chart/ECT referral. This treatment has been explained to patient and she is agreeable to referral.   Maxie Better, MSW, LCSW Clinical Social Worker 08/18/2017 2:38 PM

## 2017-08-18 NOTE — Progress Notes (Signed)
D:  Patient's self inventory sheet, patient has fair sleep, sleep medication kind of helpful.  Fair appetite, low energy level, poor concentration.  Rated depression and anxiety 10, hopeless 9.  Withdrawals, cramping, irritability.  Denied SI.  Physical problems, pain, worst pain #9 in past 24 hours, all over, no pain medicine.  Goal is "staying focus".  Plans to talk to staff.  Does have discharge plans. A:  Medications administered per MD orders.  Emotional support and encouragement given patient. R:  Denied SI and HI, contracts for safety.  Denied A/V hallucinations.  Safety maintained with 15 minute checks.

## 2017-08-18 NOTE — Progress Notes (Signed)
Patient stated she feels very agitated, irritated, tired/  Wants to discuss medications with MD.  Patient wants to discuss ECT records also.

## 2017-08-18 NOTE — BHH Group Notes (Signed)
[facility] Group Notes:  (Nursing/MHT/Case Management/Adjunct)  Date:  08/18/2017  Time:  6:47 PM  Type of Therapy:  Psychoeducational Skills  Participation Level:  Did Not Attend    Sherry [name] 08/18/2017, 6:47 PM

## 2017-08-18 NOTE — Consult Note (Signed)
ECT: I was informed of the request for consult today.  I have been reviewing the chart.  62 year old woman who has been in the hospital several days on behavioral health after having been initially treated for alcohol withdrawal.  Question raised about ECT treatment.  Patient does have a diagnosis of major depression with statements of suicidal ideation.  On the other hand it looks like substance abuse has been a major issue and the patient also may have some significant cardiac problems.  Additionally not clear how many other trials have been made for treatment other than her current fluoxetine.  Could someone who is working with the patient clinically call me tomorrow to discuss it further?  Thank you. Lu Duffel [phone].

## 2017-08-18 NOTE — Progress Notes (Signed)
UA container given to patient for specimen.

## 2017-08-18 NOTE — Progress Notes (Addendum)
[facility] MD Progress Note  08/18/2017 3:38 PM Sherry [name]  MRN:  [id number]   Subjective:  Patient reports ongoing mood symptoms- states she feels depressed but also easily irritable. She also reports feeling depressed. Denies current suicidal ideations, but states " I still do not feel good, I feel easily overwhelmed".Denies psychotic symptoms. Reports ongoing insomnia .      Objective:  I have discussed case with treatment team and have met with patient. Patient is a 62 year old single female, lives alone, on disability.She reports a long history of mood disorder, and states she has been diagnosed with Bipolar Disorder. Of note, she presents with significant oro-buccal movements, which consist of involuntary  tongue and mouth movements . Patient has dentures which may be contributing partially, but states that these movements are chronic, have been constantly  present for years, and started years ago after long term management with Abilify. Presentation and description is highly suggestive of Tardive Dyskinesia. Patient states that above movements may have become " a little but not a lot  " worse since this admission, possibly related to Thorazine management for insomnia, mood .  Of note, patient does not currently endorse or present with psychotic symptoms, and denies history of psychosis other than " sometimes I hear like my own thoughts telling me I am no good when I am depressed " . Patient reports ongoing symptoms - continues to feel depressed, sad, but also vaguely irritable. No psychotic symptoms.  Behavior is currently in good control, visible in day room.   Principal Problem: MDD (major depressive disorder), recurrent severe, without psychosis ([place]) Diagnosis:   Patient Active Problem List   Diagnosis Date Noted  . Substance induced mood disorder ([place]) [F19.94] 08/13/2017  . AKI (acute kidney injury) ([place]) [N17.9]   . MDD (major depressive disorder), recurrent severe, without psychosis  ([place]) [F33.2]   . Alcohol withdrawal ([place]) [F10.239] 08/09/2017  . Essential hypertension [I10] 07/06/2017  . Chronic kidney disease, stage III (moderate) ([place]) [N18.3] 11/17/2016  . Anxiety [F41.9] 08/21/2016  . Bipolar disorder ([place]) [F31.9] 01/31/2016  . History of breast cancer [Z85.3] 02/13/2013  . Obesity [E66.9] 02/13/2013  . Chronic hepatitis C ([place]) [B18.2] 12/17/2012   Total Time spent with patient: 25 minutes  Past Psychiatric History: See H&P  Past Medical History:  Past Medical History:  Diagnosis Date  . Cancer ([place])   . Chronic back pain   . Depression (emotion)   . Hepatitis C   . Hypertension   . Opioid dependence ([place])    in [place]    Past Surgical History:  Procedure Laterality Date  . ABDOMINAL HYSTERECTOMY    . BACK SURGERY    . CHOLECYSTECTOMY    . MYOMECTOMY     Family History:  Family History  Family history unknown: Yes   Family Psychiatric  History: See H&P Social History:  Social History   Substance and Sexual Activity  Alcohol Use No     Social History   Substance and Sexual Activity  Drug Use No    Social History   Socioeconomic History  . Marital status: Single    Spouse name: None  . Number of children: None  . Years of education: None  . Highest education level: None  Social Needs  . Financial resource strain: None  . Food insecurity - worry: None  . Food insecurity - inability: None  . Transportation needs - medical: None  . Transportation needs - non-medical: None  Occupational History  .  None  Tobacco Use  . Smoking status: Current Every Day Smoker    Packs/day: 1.00    Types: Cigarettes  . Smokeless tobacco: Never Used  Substance and Sexual Activity  . Alcohol use: No  . Drug use: No  . Sexual activity: None  Other Topics Concern  . None  Social History Narrative  . None   Additional Social History:    Sleep: Fair- states she slept 4-5 hours last night  Appetite:  Good  Current  Medications: Current Facility-Administered Medications  Medication Dose Route Frequency Provider Last Rate Last Dose  . alum & mag hydroxide-simeth (MAALOX/MYLANTA) 200-200-20 MG/5ML suspension 30 mL  30 mL Oral Q6H PRN Patriciaann Clan E, PA-C      . diphenhydrAMINE (BENADRYL) capsule 50 mg  50 mg Oral QHS PRN Money, Lowry Ram, FNP   50 mg at 08/17/17 2142  . gabapentin (NEURONTIN) capsule 300 mg  300 mg Oral TID Artist Beach, MD   300 mg at 08/18/17 1138  . hydrochlorothiazide (HYDRODIURIL) tablet 25 mg  25 mg Oral Daily Rankin, Shuvon B, NP   25 mg at 08/18/17 0805  . labetalol (NORMODYNE) tablet 100 mg  100 mg Oral BID Rankin, Shuvon B, NP   100 mg at 08/18/17 0805  . magnesium hydroxide (MILK OF MAGNESIA) suspension 30 mL  30 mL Oral Daily PRN Laverle Hobby, PA-C      . mirtazapine (REMERON) tablet 7.5 mg  7.5 mg Oral QHS Patriciaann Clan E, PA-C   7.5 mg at 08/17/17 2141  . multivitamin with minerals tablet 1 tablet  1 tablet Oral Daily Money, Lowry Ram, FNP   1 tablet at 08/18/17 0805  . nicotine (NICODERM CQ - dosed in mg/24 hours) patch 21 mg  21 mg Transdermal Daily Izediuno, Laruth Bouchard, MD   21 mg at 08/18/17 0806  . thiamine (VITAMIN B-1) tablet 100 mg  100 mg Oral Daily Money, Lowry Ram, FNP   100 mg at 08/18/17 4540    Lab Results: No results found for this or any previous visit (from the past 43 hour(s)).  Blood Alcohol level:  Lab Results  Component Value Date   ETH <10 98/05/9146    Metabolic Disorder Labs: No results found for: HGBA1C, MPG No results found for: PROLACTIN No results found for: CHOL, TRIG, HDL, CHOLHDL, VLDL, LDLCALC  Physical Findings: AIMS: Facial and Oral Movements Muscles of Facial Expression: None, normal Lips and Perioral Area: None, normal Jaw: None, normal Tongue: None, normal,Extremity Movements Upper (arms, wrists, hands, fingers): None, normal Lower (legs, knees, ankles, toes): None, normal, Trunk Movements Neck, shoulders, hips: None,  normal, Overall Severity Severity of abnormal movements (highest score from questions above): None, normal Incapacitation due to abnormal movements: None, normal Patient's awareness of abnormal movements (rate only patient's report): No Awareness, Dental Status Current problems with teeth and/or dentures?: No Does patient usually wear dentures?: No  CIWA:  CIWA-Ar Total: 1 COWS:  COWS Total Score: 1  Musculoskeletal: Strength & Muscle Tone: within normal limits Gait & Station: normal Patient leans: N/A  Psychiatric Specialty Exam: Physical Exam  Nursing note and vitals reviewed. Constitutional: She is oriented to person, place, and time. She appears well-developed and well-nourished.  Cardiovascular: Normal rate.  Respiratory: Effort normal.  Musculoskeletal: Normal range of motion.  Neurological: She is alert and oriented to person, place, and time.  Skin: Skin is warm.    Review of Systems  Constitutional: Negative.   HENT: Negative.  Eyes: Negative.   Respiratory: Negative.   Cardiovascular: Negative.   Gastrointestinal: Negative.   Genitourinary: Negative.   Musculoskeletal: Negative.   Skin: Negative.   Neurological: Negative.   Endo/Heme/Allergies: Negative.   Psychiatric/Behavioral: Positive for depression and suicidal ideas. The patient is nervous/anxious.   no headache, no chest pain, no shortness of breath, reports urine has strong odor, describes mild urgency, denies dysuria, no fever, no chills    Blood pressure (!) 164/73, pulse 69, temperature 97.8 F (36.6 C), temperature source Oral, resp. rate 18, height '5\' 2"'$  (1.575 m), weight 99.3 kg (219 lb).Body mass index is 40.06 kg/m.  General Appearance: Fairly Groomed  Eye Contact:  Good  Speech:  Normal Rate  Volume:  Normal  Mood:  reports she continues to feel depressed   Affect:  mildly constricted, but reactive   Thought Process:  Linear and Descriptions of Associations: Intact  Orientation:  Other:   fully alert and attentive   Thought Content:  no hallucinations, no delusions, not internally preoccupied   Suicidal Thoughts:  reports intermittent passive SI, but denies plan or intention of hurting self or of suicide and contracts for safety on unit   Homicidal Thoughts:  No  Memory:  recent and remote grossly intact   Judgement:  Fair- improving   Insight:  Fair- improving   Psychomotor Activity:  Normal  Concentration:  Concentration: Good and Attention Span: Good  Recall:  Good  Fund of Knowledge:  Good  Language:  Good  Akathisia:  No  Handed:  Right  AIMS (if indicated):    significant involuntary oro-buccal movements which are chronic  Assets:  Communication Skills Desire for Improvement Financial Resources/Insurance Housing Social Support  ADL's:  Intact  Cognition:  WNL  Sleep:  Number of Hours: 6.75   Assessment - patient reports ongoing depression, intermittent passive SI, vague irritability, and feeling easily overwhelmed. She is not presenting with any alcohol WDL symptoms at this time. Presents with involuntary oro buccal movements suggestive of TD, which she states are chronic, present for years, and which started after long term Abilify management.  Based on above , and as she has no clear history of psychosis ( presents with mood disorder and has been diagnosed with Bipolar Disorder in the past ) , would avoid further use of antipsychotic medications if possible .  We considered possibility of Clozaril , but patient concerned about need for weekly blood work. Would consider Valbenazine trial, if available- will discuss with Pharmacist, She is considering ECT as an option, if possible . We discussed other mood stabilizer options, agrees to Depakote, which she has not been on in the past. Will start at low dose, based on age, history of kidney disease .  Treatment Plan Summary: Daily contact with patient to assess and evaluate symptoms and progress in treatment,  Medication management and Plan is to:  Treatment Plan reviewed as below today 2/6  Encourage group and milieu participation to work on coping skills and symptom reduction Start Depakote ER 250  mgrs QHS initially, titrate gradually ( for mood disorder )  Continue Remeron at 15 mgrs QHS for depression, anxiety , insomnia  Start Protonix 20 mgrs QDAY for GERD. Continue Neurontin  300 mg PO TID for chronic back pain, anxiety  Check UA /Urine Culture Patient states she is considering ECT as an option- will consult Dr. Weber Cooks at [facility] for potential ECT management  Treatment Team working on disposition planning options  Jenne Campus, MD 08/18/2017,  3:38 PM   Patient ID: Abel Ra, female   DOB: June 06, 1956, 62 y.o.   MRN: [id number]

## 2017-08-18 NOTE — Progress Notes (Signed)
Adult Psychoeducational Group Note  Date:  08/18/2017 Time:  1:04 AM  Group Topic/Focus:  Wrap-Up Group:   The focus of this group is to help patients review their daily goal of treatment and discuss progress on daily workbooks.  Participation Level:  Active  Participation Quality:  Appropriate  Affect:  Appropriate  Cognitive:  Appropriate  Insight: Good  Engagement in Group:  Engaged  Modes of Intervention:  Discussion  Additional Comments:  Patient goal was to be able to stay inpatient. Patient has achieved her goal. Patient rated her day as a three or four because she wanted to go home but made a decision to stay and get the help she needed.   Nalda Shackleford, [place] 08/18/2017, 1:04 AM

## 2017-08-18 NOTE — Progress Notes (Signed)
Patient will discuss medications with MD.  Stated her labetalol at home is 300 mg bid.

## 2017-08-18 NOTE — [facility] Group Notes (Signed)
LCSW Group Therapy Note 08/18/2017 2:47 PM  Type of Therapy/Topic: Group Therapy: Feelings about Diagnosis  Participation Level: Active   Description of Group:  This group will allow patients to explore their thoughts and feelings about diagnoses they have received. Patients will be guided to explore their level of understanding and acceptance of these diagnoses. Facilitator will encourage patients to process their thoughts and feelings about the reactions of others to their diagnosis and will guide patients in identifying ways to discuss their diagnosis with significant others in their lives. This group will be process-oriented, with patients participating in exploration of their own experiences, giving and receiving support, and processing challenge from other group members.  Therapeutic Goals: 1. Patient will demonstrate understanding of diagnosis as evidenced by identifying two or more symptoms of the disorder 2. Patient will be able to express two feelings regarding the diagnosis 3. Patient will demonstrate their ability to communicate their needs through discussion and/or role play  Summary of Patient Progress:  Sherry [name] was engaged throughout the group session. She participated and contributed to the group's discussion regarding diagnosis. [name] stated she was ashamed and in denial when she learned she had a mental health diagnosis. [name] states that she hopes that doctors and psychiatrist listen to their patient's more, because no one knows what is best for an individual, more than the individual themselves.      Therapeutic Modalities:  Cognitive Behavioral Therapy Brief Therapy Feelings Identification    [place] Clinical Social Worker

## 2017-08-18 NOTE — Plan of Care (Signed)
Nurse discussed anxiety, depression, coping skills with patient. 

## 2017-08-18 NOTE — Progress Notes (Addendum)
Recreation Therapy Notes  Date: 08/18/17 Time: 0930 Location: 300 Hall Dayroom  Group Topic: Stress Management  Goal Area(s) Addresses:  Patient will verbalize importance of using healthy stress management.  Patient will identify positive emotions associated with healthy stress management.   Intervention: Stress Management  Activity :  Guided Imagery.  LRT introduced the stress management technique of guided imagery.  LRT read a script that guided patients to envision laying out in the sun on a warm day, enjoying the breeze and sunshine.  Patients were to listen and follow along as script was read.  Education:  Stress Management, Discharge Planning.   Education Outcome: Acknowledges edcuation/In group clarification offered/Needs additional education  Clinical Observations/Feedback: Pt did not attend group.    Victorino Sparrow, LRT/CTRS         Ria Comment, Elliot Simoneaux A 08/18/2017 11:09 AM

## 2017-08-18 NOTE — Progress Notes (Signed)
UA completed and put in lab refrigerator for pick up.

## 2017-08-18 NOTE — Progress Notes (Signed)
Nursing Progress Note: 7p-7a D: Pt currently presents with a depressed/anxious affect and behavior. Interacting appropriately with the milieu. Pt reports good sleep during the previous night with current medication regimen. Pt did attend wrap-up group.  A: Pt provided with medications per providers orders. Pt's labs and vitals were monitored throughout the night. Pt supported emotionally and encouraged to express concerns and questions. Pt educated on medications.  R: Pt's safety ensured with 15 minute and environmental checks. Pt currently denies SI, HI, and AVH. Pt verbally contracts to seek staff if SI,HI, or AVH occurs and to consult with staff before acting on any harmful thoughts. Will continue to monitor.

## 2017-08-18 NOTE — Tx Team (Signed)
Interdisciplinary Treatment and Diagnostic Plan Update  08/18/2017 Time of Session: 0830AM Baileigh Modisette MRN: [id number]  Principal Diagnosis: MDD, recurrent, severe, with psychosis  Secondary Diagnoses: Principal Problem:   MDD (major depressive disorder), recurrent severe, without psychosis ([place]) Active Problems:   Substance induced mood disorder ([place])   Current Medications:  Current Facility-Administered Medications  Medication Dose Route Frequency Provider Last Rate Last Dose  . alum & mag hydroxide-simeth (MAALOX/MYLANTA) 200-200-20 MG/5ML suspension 30 mL  30 mL Oral Q6H PRN Patriciaann Clan E, PA-C      . diphenhydrAMINE (BENADRYL) capsule 50 mg  50 mg Oral QHS PRN Money, Lowry Ram, FNP   50 mg at 08/17/17 2142  . gabapentin (NEURONTIN) capsule 300 mg  300 mg Oral TID Artist Beach, MD   300 mg at 08/18/17 0805  . hydrochlorothiazide (HYDRODIURIL) tablet 25 mg  25 mg Oral Daily Rankin, Shuvon B, NP   25 mg at 08/18/17 0805  . labetalol (NORMODYNE) tablet 100 mg  100 mg Oral BID Rankin, Shuvon B, NP   100 mg at 08/18/17 0805  . magnesium hydroxide (MILK OF MAGNESIA) suspension 30 mL  30 mL Oral Daily PRN Laverle Hobby, PA-C      . mirtazapine (REMERON) tablet 7.5 mg  7.5 mg Oral QHS Patriciaann Clan E, PA-C   7.5 mg at 08/17/17 2141  . multivitamin with minerals tablet 1 tablet  1 tablet Oral Daily Money, Lowry Ram, FNP   1 tablet at 08/18/17 0805  . nicotine (NICODERM CQ - dosed in mg/24 hours) patch 21 mg  21 mg Transdermal Daily Izediuno, Laruth Bouchard, MD   21 mg at 08/18/17 0806  . thiamine (VITAMIN B-1) tablet 100 mg  100 mg Oral Daily Money, Lowry Ram, FNP   100 mg at 08/18/17 7425   PTA Medications: Medications Prior to Admission  Medication Sig Dispense Refill Last Dose  . FLUoxetine (PROZAC) 20 MG capsule Take 20 mg by mouth daily.   08/08/2017 at Unknown time  . folic acid (FOLVITE) 1 MG tablet Take 1 tablet (1 mg total) by mouth daily.     . hydrochlorothiazide  (HYDRODIURIL) 25 MG tablet Take 25 mg by mouth daily.   08/09/2017 at Unknown time  . labetalol (NORMODYNE) 100 MG tablet Take 3 tablets (300 mg total) by mouth 2 (two) times daily.     . pantoprazole (PROTONIX) 20 MG tablet Take 1 tablet (20 mg total) by mouth daily. (Patient not taking: Reported on 08/09/2017) 14 tablet 0 Not Taking at Unknown time  . sucralfate (CARAFATE) 1 G tablet Take 1 tablet (1 g total) by mouth 4 (four) times daily -  with meals and at bedtime. (Patient not taking: Reported on 08/09/2017) 90 tablet 0 Not Taking at Unknown time  . thiamine 100 MG tablet Take 1 tablet (100 mg total) by mouth daily.     Marland Kitchen zolpidem (AMBIEN) 10 MG tablet Take 10 mg by mouth at bedtime as needed.   unk    Patient Stressors: Financial difficulties Health problems Substance abuse  Patient Strengths: Capable of independent living Curator fund of knowledge Motivation for treatment/growth  Treatment Modalities: Medication Management, Group therapy, Case management,  1 to 1 session with clinician, Psychoeducation, Recreational therapy.   Physician Treatment Plan for Primary Diagnosis: MDD, recurrent, severe, with psychosis  Medication Management: Evaluate patient's response, side effects, and tolerance of medication regimen.  Therapeutic Interventions: 1 to 1 sessions, Unit Group sessions and Medication administration.  Evaluation  of Outcomes: Not Met  Physician Treatment Plan for Secondary Diagnosis: Principal Problem:   MDD (major depressive disorder), recurrent severe, without psychosis ([place]) Active Problems:   Substance induced mood disorder ([place])   Medication Management: Evaluate patient's response, side effects, and tolerance of medication regimen.  Therapeutic Interventions: 1 to 1 sessions, Unit Group sessions and Medication administration.  Evaluation of Outcomes: Progressing  RN Treatment Plan for Primary Diagnosis: MDD, recurrent, severe, with  psychosis Long Term Goal(s): Knowledge of disease and therapeutic regimen to maintain health will improve  Short Term Goals: Ability to remain free from injury will improve, Ability to disclose and discuss suicidal ideas and Ability to identify and develop effective coping behaviors will improve  Medication Management: RN will administer medications as ordered by provider, will assess and evaluate patient's response and provide education to patient for prescribed medication. RN will report any adverse and/or side effects to prescribing provider.  Therapeutic Interventions: 1 on 1 counseling sessions, Psychoeducation, Medication administration, Evaluate responses to treatment, Monitor vital signs and CBGs as ordered, Perform/monitor CIWA, COWS, AIMS and Fall Risk screenings as ordered, Perform wound care treatments as ordered.  Evaluation of Outcomes: Not Met   LCSW Treatment Plan for Primary Diagnosis: MDD, recurrent, severe, with psychosis Long Term Goal(s): Safe transition to appropriate next level of care at discharge, Engage patient in therapeutic group addressing interpersonal concerns.  Short Term Goals: Engage patient in aftercare planning with referrals and resources, Facilitate patient progression through stages of change regarding substance use diagnoses and concerns and Identify triggers associated with mental health/substance abuse issues  Therapeutic Interventions: Assess for all discharge needs, 1 to 1 time with Social worker, Explore available resources and support systems, Assess for adequacy in community support network, Educate family and significant other(s) on suicide prevention, Complete Psychosocial Assessment, Interpersonal group therapy.  Evaluation of Outcomes: Progressing  Progress in Treatment: Attending groups: Yes Participating in groups: Yes Taking medication as prescribed: Yes. Toleration medication: Yes. Family/Significant other contact made: Contact attempts  made with pt's sister. SPE completed with pt.  Patient understands diagnosis: Yes. Discussing patient identified problems/goals with staff: Yes. Medical problems stabilized or resolved: Yes. Denies suicidal/homicidal ideation: Yes. Issues/concerns per patient self-inventory: No. Other: n/a   New problem(s) identified: No, Describe:  n/a  New Short Term/Long Term Goal(s): detox, medication management for mood stabilization; elimination of SI thoughts; development of comprehensive mental wellness/sobriety plan.   Patient Goal: "to get better. I want therapy, medication, and coping skills."   Discharge Plan or Barriers: Pt interested in referral for ECT at [facility] and reports she is not responding to medication management. She will return home at discharge and has CDIOP assessment scheduled for next week. [place] pamphlet and AA/NA list also provided for additional community support.   Reason for Continuation of Hospitalization: Anxiety Depression Medication management  Estimated Length of Stay: Friday, 08/20/17  Attendees: Patient: 08/18/2017 8:47 AM  Physician: Dr. Parke Poisson MD; Dr. Parke Poisson MD 08/18/2017 8:47 AM  Nursing: Trinna Post RN; [facility] RN 08/18/2017 8:47 AM  RN Care Manager: Lars Pinks CM 08/18/2017 8:47 AM  Social Worker: Maxie Better, LCSW 08/18/2017 8:47 AM  Recreational Therapist: x 08/18/2017 8:47 AM  Other: Lindell Spar NP 08/18/2017 8:47 AM  Other:  08/18/2017 8:47 AM  Other: 08/18/2017 8:47 AM    Scribe for Treatment Team: [name] Smart, LCSW 08/18/2017 8:47 AM

## 2017-08-19 LAB — URINALYSIS, ROUTINE W REFLEX MICROSCOPIC
Bilirubin Urine: NEGATIVE
Glucose, UA: NEGATIVE mg/dL
Hgb urine dipstick: NEGATIVE
Ketones, ur: NEGATIVE mg/dL
Leukocytes, UA: NEGATIVE
Nitrite: NEGATIVE
Protein, ur: 30 mg/dL — AB
Specific Gravity, Urine: 1.013 (ref 1.005–1.030)
pH: 5 (ref 5.0–8.0)

## 2017-08-19 MED ORDER — LABETALOL HCL 100 MG PO TABS
100.0000 mg | ORAL_TABLET | Freq: Every day | ORAL | Status: DC
  Administered 2017-08-19: 100 mg via ORAL
  Filled 2017-08-19 (×3): qty 1

## 2017-08-19 MED ORDER — HYDROXYZINE HCL 25 MG PO TABS
25.0000 mg | ORAL_TABLET | Freq: Four times a day (QID) | ORAL | Status: DC | PRN
  Administered 2017-08-19 – 2017-08-24 (×9): 25 mg via ORAL
  Filled 2017-08-19 (×8): qty 1

## 2017-08-19 MED ORDER — DIVALPROEX SODIUM ER 500 MG PO TB24
500.0000 mg | ORAL_TABLET | Freq: Every day | ORAL | Status: DC
  Administered 2017-08-19: 500 mg via ORAL
  Filled 2017-08-19 (×3): qty 1

## 2017-08-19 MED ORDER — MIRTAZAPINE 15 MG PO TABS
15.0000 mg | ORAL_TABLET | Freq: Once | ORAL | Status: AC
  Administered 2017-08-19: 15 mg via ORAL
  Filled 2017-08-19: qty 1

## 2017-08-19 MED ORDER — LABETALOL HCL 100 MG PO TABS
200.0000 mg | ORAL_TABLET | ORAL | Status: DC
  Administered 2017-08-20: 200 mg via ORAL
  Filled 2017-08-19 (×2): qty 1
  Filled 2017-08-19 (×2): qty 2

## 2017-08-19 NOTE — [facility] Group Notes (Signed)
LCSW Group Therapy Note  08/19/2017 1:15pm  Type of Therapy/Topic:  Group Therapy:  Emotion Regulation  Participation Level:  Active   Description of Group:   The purpose of this group is to assist patients in learning to regulate negative emotions and experience positive emotions. Patients will be guided to discuss ways in which they have been vulnerable to their negative emotions. These vulnerabilities will be juxtaposed with experiences of positive emotions or situations, and patients will be challenged to use positive emotions to combat negative ones. Special emphasis will be placed on coping with negative emotions in conflict situations, and patients will process healthy conflict resolution skills.  Therapeutic Goals: 1. Patient will identify two positive emotions or experiences to reflect on in order to balance out negative emotions 2. Patient will label two or more emotions that they find the most difficult to experience 3. Patient will demonstrate positive conflict resolution skills through discussion and/or role plays  Summary of Patient Progress:  Sherry [name] was attentive and engaged during today's processing group. She shared that she struggles most with regulating depression. "Sometimes I just want to crawl into bed and put the covers over my head and sleep. But I know it's the depression, not me just feeling tired." She shared that her goal is to learn how to stop talking and start listening. "I want to learn every coping skill I can because what I've been doing isn't working." Sherry [name] continues to show progress in the group setting with improving insight.   Therapeutic Modalities:   Cognitive Behavioral Therapy Feelings Identification Dialectical Behavioral Therapy   [name] Smart, LCSW 08/19/2017 2:50 PM

## 2017-08-19 NOTE — Progress Notes (Addendum)
[facility] MD Progress Note  08/19/2017 3:43 PM Karey Suthers  MRN:  [id number]   Subjective:  Patient reports ongoing subjective sense of irritability, agitation. Denies suicidal ideations . She feels that there has been no change in the frequency or intensity of involuntary oro-buccal movements.   States she did not sleep well last night . Thus far tolerating Depakote ER trial well .    Objective:  I have discussed case with treatment team and have met with patient. Reports ongoing depression, anxiety, and subjective sense of irritability. Denies suicidal ideations.At this time presents calm, behavior is in good control, and is not psycho-motorically restless or agitated at this time. Denies medication side effects, and thus far has tolerated Depakote ER trial well- her major concern at this time is sub-optimal sleep.  Although oro-buccal involuntary movements remain noticeable , they appear partially improved today. ECT has been considered as a possible option to address ongoing mood symptoms , particularly in the context of history of TD and rationale to minimize or avoid antipsychotics as long term management. Patient states she would consider this option if available to her. I have discussed case, with her express consent, with Dr. Weber Cooks at [facility], who performs ECT . We discussed rationale to titrate Depakote ER gradually as a mood stabilizer  Labs- UA unremarkable except for (+) protein, negative nitrates, negative leukocytes     Principal Problem: MDD (major depressive disorder), recurrent severe, without psychosis ([place]) Diagnosis:   Patient Active Problem List   Diagnosis Date Noted  . Substance induced mood disorder ([place]) [F19.94] 08/13/2017  . AKI (acute kidney injury) ([place]) [N17.9]   . MDD (major depressive disorder), recurrent severe, without psychosis ([place]) [F33.2]   . Alcohol withdrawal ([place]) [F10.239] 08/09/2017  . Essential hypertension [I10] 07/06/2017  . Chronic kidney  disease, stage III (moderate) ([place]) [N18.3] 11/17/2016  . Anxiety [F41.9] 08/21/2016  . Bipolar disorder ([place]) [F31.9] 01/31/2016  . History of breast cancer [Z85.3] 02/13/2013  . Obesity [E66.9] 02/13/2013  . Chronic hepatitis C ([place]) [B18.2] 12/17/2012   Total Time spent with patient: 20 minutes  Past Psychiatric History: See H&P  Past Medical History:  Past Medical History:  Diagnosis Date  . Cancer ([place])   . Chronic back pain   . Depression (emotion)   . Hepatitis C   . Hypertension   . Opioid dependence ([place])    in [place]    Past Surgical History:  Procedure Laterality Date  . ABDOMINAL HYSTERECTOMY    . BACK SURGERY    . CHOLECYSTECTOMY    . MYOMECTOMY     Family History:  Family History  Family history unknown: Yes   Family Psychiatric  History: See H&P Social History:  Social History   Substance and Sexual Activity  Alcohol Use No     Social History   Substance and Sexual Activity  Drug Use No    Social History   Socioeconomic History  . Marital status: Single    Spouse name: None  . Number of children: None  . Years of education: None  . Highest education level: None  Social Needs  . Financial resource strain: None  . Food insecurity - worry: None  . Food insecurity - inability: None  . Transportation needs - medical: None  . Transportation needs - non-medical: None  Occupational History  . None  Tobacco Use  . Smoking status: Current Every Day Smoker    Packs/day: 1.00    Types: Cigarettes  . Smokeless  tobacco: Never Used  Substance and Sexual Activity  . Alcohol use: No  . Drug use: No  . Sexual activity: None  Other Topics Concern  . None  Social History Narrative  . None   Additional Social History:    Sleep: Fair- states she is not sleeping well   Appetite:  Good  Current Medications: Current Facility-Administered Medications  Medication Dose Route Frequency Provider Last Rate Last Dose  . alum & mag  hydroxide-simeth (MAALOX/MYLANTA) 200-200-20 MG/5ML suspension 30 mL  30 mL Oral Q6H PRN Patriciaann Clan E, PA-C   30 mL at 08/19/17 1309  . divalproex (DEPAKOTE ER) 24 hr tablet 500 mg  500 mg Oral QHS Cobos, Fernando A, MD      . gabapentin (NEURONTIN) capsule 300 mg  300 mg Oral TID Artist [place], MD   300 mg at 08/19/17 1309  . hydrochlorothiazide (HYDRODIURIL) tablet 25 mg  25 mg Oral Daily Rankin, Shuvon B, NP   25 mg at 08/19/17 1019  . hydrOXYzine (ATARAX/VISTARIL) tablet 25 mg  25 mg Oral Q6H PRN Cobos, Myer Peer, MD   25 mg at 08/19/17 1309  . labetalol (NORMODYNE) tablet 100 mg  100 mg Oral QHS Cobos, Myer Peer, MD      . Derrill Memo ON 08/20/2017] labetalol (NORMODYNE) tablet 200 mg  200 mg Oral BH-q7a Cobos, Fernando A, MD      . magnesium hydroxide (MILK OF MAGNESIA) suspension 30 mL  30 mL Oral Daily PRN Laverle Hobby, PA-C      . mirtazapine (REMERON) tablet 15 mg  15 mg Oral QHS Cobos, Myer Peer, MD   15 mg at 08/18/17 2204  . multivitamin with minerals tablet 1 tablet  1 tablet Oral Daily Money, Lowry Ram, FNP   1 tablet at 08/19/17 1020  . nicotine (NICODERM CQ - dosed in mg/24 hours) patch 21 mg  21 mg Transdermal Daily Izediuno, Laruth Bouchard, MD   21 mg at 08/19/17 1021  . pantoprazole (PROTONIX) EC tablet 20 mg  20 mg Oral Daily Cobos, Myer Peer, MD   20 mg at 08/19/17 1020  . thiamine (VITAMIN B-1) tablet 100 mg  100 mg Oral Daily Money, Lowry Ram, FNP   100 mg at 08/19/17 1020    Lab Results:  Results for orders placed or performed during the hospital encounter of 08/12/17 (from the past 48 hour(s))  Urinalysis, Routine w reflex microscopic     Status: Abnormal   Collection Time: 08/18/17  6:32 PM  Result Value Ref Range   Color, Urine YELLOW YELLOW   APPearance CLEAR CLEAR   Specific Gravity, Urine 1.013 1.005 - 1.030   pH 5.0 5.0 - 8.0   Glucose, UA NEGATIVE NEGATIVE mg/dL   Hgb urine dipstick NEGATIVE NEGATIVE   Bilirubin Urine NEGATIVE NEGATIVE   Ketones, ur  NEGATIVE NEGATIVE mg/dL   Protein, ur 30 (A) NEGATIVE mg/dL   Nitrite NEGATIVE NEGATIVE   Leukocytes, UA NEGATIVE NEGATIVE   RBC / HPF 0-5 0 - 5 RBC/hpf   WBC, UA 0-5 0 - 5 WBC/hpf   Bacteria, UA RARE (A) NONE SEEN   Squamous Epithelial / LPF 0-5 (A) NONE SEEN   Mucus PRESENT     Comment: Performed at [facility], [place] [address] [place],  [address]    Blood Alcohol level:  Lab Results  Component Value Date   ETH <10 01/74/9449    Metabolic Disorder Labs: No results found for: HGBA1C, MPG No  results found for: PROLACTIN No results found for: CHOL, TRIG, HDL, CHOLHDL, VLDL, LDLCALC  Physical Findings: AIMS: Facial and Oral Movements Muscles of Facial Expression: None, normal Lips and Perioral Area: None, normal Jaw: None, normal Tongue: None, normal,Extremity Movements Upper (arms, wrists, hands, fingers): None, normal Lower (legs, knees, ankles, toes): None, normal, Trunk Movements Neck, shoulders, hips: None, normal, Overall Severity Severity of abnormal movements (highest score from questions above): None, normal Incapacitation due to abnormal movements: None, normal Patient's awareness of abnormal movements (rate only patient's report): No Awareness, Dental Status Current problems with teeth and/or dentures?: No Does patient usually wear dentures?: No  CIWA:  CIWA-Ar Total: 0 COWS:  COWS Total Score: 1  Musculoskeletal: Strength & Muscle Tone: within normal limits Gait & Station: normal Patient leans: N/A  Psychiatric Specialty Exam: Physical Exam  Nursing note and vitals reviewed. Constitutional: She is oriented to person, place, and time. She appears well-developed and well-nourished.  Cardiovascular: Normal rate.  Respiratory: Effort normal.  Musculoskeletal: Normal range of motion.  Neurological: She is alert and oriented to person, place, and time.  Skin: Skin is warm.    Review of Systems  Constitutional: Negative.   HENT:  Negative.   Eyes: Negative.   Respiratory: Negative.   Cardiovascular: Negative.   Gastrointestinal: Negative.   Genitourinary: Negative.   Musculoskeletal: Negative.   Skin: Negative.   Neurological: Negative.   Endo/Heme/Allergies: Negative.   Psychiatric/Behavioral: Positive for depression and suicidal ideas. The patient is nervous/anxious.   no headache, no chest pain, no shortness of breath, denies dysuria, no fever, no chills   Blood pressure (!) 171/83, pulse 70, temperature 97.8 F (36.6 C), temperature source Oral, resp. rate 18, height 5' 2"  (1.575 m), weight 99.3 kg (219 lb).Body mass index is 40.06 kg/m.  General Appearance: improving grooming   Eye Contact:  Good  Speech:  Normal Rate  Volume:  Normal  Mood:  reports mood remains depressed, irritable   Affect:  vaguely constricted and irritable, but reactive   Thought Process:  Goal Directed and Descriptions of Associations: Intact  Orientation:  Other:  fully alert and attentive   Thought Content:  no hallucinations, no delusions, not internally preoccupied   Suicidal Thoughts:  intermittent passive SI, denies plan or intention of hurting self or of suicide and contracts for safety on unit at this time   Homicidal Thoughts:  No no homicidal or violent ideations  Memory:  recent and remote grossly intact   Judgement:  Fair- improving   Insight:  Fair- improving   Psychomotor Activity:  Normal- no psychomotor agitation   Concentration:  Concentration: Good and Attention Span: Good  Recall:  Good  Fund of Knowledge:  Good  Language:  Good  Akathisia:  No  Handed:  Right  AIMS (if indicated):    significant involuntary oro-buccal movements which are chronic  Assets:  Communication Skills Desire for Improvement Financial Resources/Insurance Housing Social Support  ADL's:  Intact  Cognition:  WNL  Sleep:  Number of Hours: 6.75   Assessment - patient is reporting ongoing depression, as well as a subjective feeling  of irritability, dysphoria. Her major concern at this time is poor sleep. She reports a history of Bipolar Disorder, and due to presentation indicative of TD, we are managing mood disorder without antipsychotic medications at present - she has been started on Depakote, which she is tolerating well thus far . Patient has expressed some interest in ECT as an option, and I have consulted  with Dr. Weber Cooks, who provides ECT management at [facility]. Of note, patient states she has been on Labetalol for years for HTN management, states she was taking a higher dose of this medication prior to admission , requests titration   Treatment Plan Summary: Daily contact with patient to assess and evaluate symptoms and progress in treatment, Medication management and Plan is to:  Treatment Plan reviewed as below today 2/7  Encourage group and milieu participation to work on coping skills and symptom reduction Increase  Depakote ER to 500   mgrs QHS for mood disorder  Continue Remeron at 15 mgrs QHS for depression, anxiety , insomnia  Increase Labetalol to 200 mgrs QAM and 100 mgrs QHS for management of HTN Continue  Protonix 20 mgrs QDAY for GERD. Continue Neurontin  300 mg PO TID for chronic back pain, anxiety  Patient considering ECT as a potential treatment option  Jenne Campus, MD 08/19/2017, 3:43 PM   Patient ID: [name], female   DOB: 02-Aug-1955, 62 y.o.   MRN: [id number]

## 2017-08-19 NOTE — Progress Notes (Signed)
Patient denies SI, HI and AVH.  Patient has been attending groups and compliant with medications. Patient has had no incidents of behavioral dyscontrol this shift.   Assess patient for safety, offer medications as prescribed, engage patient in 1:1 staff talks.   Continue to monitor as planned patient able to contract for safety.

## 2017-08-20 LAB — URINE CULTURE: Special Requests: NORMAL

## 2017-08-20 MED ORDER — PANTOPRAZOLE SODIUM 20 MG PO TBEC
20.0000 mg | DELAYED_RELEASE_TABLET | Freq: Two times a day (BID) | ORAL | Status: DC
  Administered 2017-08-20 – 2017-08-25 (×9): 20 mg via ORAL
  Filled 2017-08-20 (×15): qty 1

## 2017-08-20 MED ORDER — LABETALOL HCL 200 MG PO TABS
200.0000 mg | ORAL_TABLET | Freq: Two times a day (BID) | ORAL | Status: DC
  Administered 2017-08-20 – 2017-08-25 (×10): 200 mg via ORAL
  Filled 2017-08-20 (×14): qty 1

## 2017-08-20 MED ORDER — DIVALPROEX SODIUM ER 500 MG PO TB24
500.0000 mg | ORAL_TABLET | ORAL | Status: DC
  Administered 2017-08-21 – 2017-08-23 (×3): 500 mg via ORAL
  Filled 2017-08-20 (×6): qty 1

## 2017-08-20 MED ORDER — RAMELTEON 8 MG PO TABS
8.0000 mg | ORAL_TABLET | Freq: Every day | ORAL | Status: DC
  Administered 2017-08-20 – 2017-08-24 (×4): 8 mg via ORAL
  Filled 2017-08-20 (×8): qty 1

## 2017-08-20 NOTE — Progress Notes (Signed)
D: Patient presents with depressed mood; lethargic this morning.  She had to be coaxed to come to the medication window to take her medications.  She expressed concern over her depakote stating, "I'm supposed to be taking it during the day."  Informed patient that she should review this with her doctor as it is administered at bedtime currently.  Patient appears inpatient and somewhat irritable. She has minimal interaction with staff.  She denies any thoughts of self harm.  Patient slept 6 hours last night.  Patient is a possible candidate for ECT at [facility].  A: Continue to monitor medication management and MD orders.  Safety checks completed every 15 minutes per protocol.  Offer support and encouragement as needed.  R: Patient has minimal interaction with staff; her behavior is appropriate.

## 2017-08-20 NOTE — Progress Notes (Signed)
D    Pt is pleasant on approach and has been cooperative   She stayed in bed while AA group was going on but got up for snack and medications   She complained of having gas and a sore throat and received medications for same  A    Verbal support given   Medications administered and effectiveness monitored    Q 15 min checks R   Pt is safe at present time and was receptive to verbal support

## 2017-08-20 NOTE — Progress Notes (Signed)
[facility] Group Notes:  (Nursing/MHT/Case Management/Adjunct)  Date: 08/19/2017  Time:  2045  Type of Therapy:  wrap up group  Participation Level:  Active  Participation Quality:  Appropriate, Attentive, Sharing and Supportive  Affect:  Appropriate  Cognitive:  Appropriate  Insight:  Lacking  Engagement in Group:  Engaged  Modes of Intervention:  Clarification, Education and Support  Summary of Progress/Problems:  [name] 08/20/2017, 12:38 AM

## 2017-08-20 NOTE — [facility] Group Notes (Signed)
LCSW Group Therapy Note 08/20/2017 3:24 PM  Type of Therapy and Topic: Group Therapy: Feelings around Relapse and Recovery  Participation Level: Active   Description of Group:  Patients in this group will discuss emotions they experience before and after a relapse. They will process how experiencing these feelings, or avoidance of experiencing them, relates to having a relapse. Facilitator will guide patients to explore emotions they have related to recovery. Patients will be encouraged to process which emotions are more powerful. They will be guided to discuss the emotional reaction significant others in their lives may have to their relapse or recovery. Patients will be assisted in exploring ways to respond to the emotions of others without this contributing to a relapse.  Therapeutic Goals: 1. Patient will identify two or more emotions that lead to a relapse for them 2. Patient will identify two emotions that result when they relapse 3. Patient will identify two emotions related to recovery 4. Patient will demonstrate ability to communicate their needs through discussion and/or role plays  Summary of Patient Progress:  Sherry [name] was engaged throughout. Sherry [name] participated and contributed to the group's discussion. Sherry [name] states that she plans to be honest with her support groups in order to avoid relapsing at discharge.    Therapeutic Modalities:  Cognitive Behavioral Therapy Solution-Focused Therapy Assertiveness Training Relapse Prevention Therapy   Theresa Duty Clinical Social Worker

## 2017-08-20 NOTE — [facility] Group Notes (Signed)
[facility] Group Notes: Nursing Date:  08/20/2017  Time:  1000 am  Type of Therapy:  Aromatherapy Group  Participation Level:  Active  Participation Quality:  Appropriate and Attentive  Affect:  Appropriate  Cognitive:  Alert and Appropriate  Insight:  Appropriate and Good  Engagement in Group:  Engaged  Modes of Intervention:  Support  Summary of Progress/Problems: Patient appeared engaged and interested in the topic.  Zipporah Plants 08/20/2017, 1:19 PM

## 2017-08-20 NOTE — Progress Notes (Signed)
Recreation Therapy Notes  Date: 08/20/17 Time: 0930 Location: [address] Dayroom  Group Topic: Stress Management  Goal Area(s) Addresses:  Patient will verbalize importance of using healthy stress management.  Patient will identify positive emotions associated with healthy stress management.   Intervention: Stress Management  Activity :  Progressive Muscle Relaxation.  LRT introduced the stress management technique of progressive muscle relaxation.  Patients were to follow along as LRT read script to tense each muscle group and then relax them separately.   Education:  Stress Management, Discharge Planning.   Education Outcome: Acknowledges edcuation/In group clarification offered/Needs additional education  Clinical Observations/Feedback: Pt did not attend group.     Victorino Sparrow, LRT/CTRS        Victorino Sparrow A 08/20/2017 12:14 PM

## 2017-08-20 NOTE — Progress Notes (Signed)
Nursing Progress Note: 7p-7a D: Pt currently presents with a depressed/anxious affect and behavior. Pt states "The problem with my mouth all began when I stopped taking abilify. That's what it is. No one listens to me." Interacting appropriately with the milieu. Pt reports good sleep during the previous night with current medication regimen. Pt did attend wrap-up group.  A: Pt provided with medications per providers orders. Pt's labs and vitals were monitored throughout the night. Pt supported emotionally and encouraged to express concerns and questions. Pt educated on medications.  R: Pt's safety ensured with 15 minute and environmental checks. Pt currently denies SI, HI, and AVH. Pt verbally contracts to seek staff if SI,HI, or AVH occurs and to consult with staff before acting on any harmful thoughts. Will continue to monitor.

## 2017-08-20 NOTE — Plan of Care (Signed)
  Progressing Activity: Sleeping patterns will improve 08/20/2017 1522 - Progressing by Joice Lofts, RN

## 2017-08-20 NOTE — Progress Notes (Signed)
Adult Psychoeducational Group Note  Date:  08/20/2017 Time:  7:16 PM  Group Topic/Focus:  Relapse Prevention Planning:   The focus of this group is to define relapse and discuss the need for planning to combat relapse.  Participation Level:  Active  Participation Quality:  Appropriate and Attentive  Affect:  Depressed  Cognitive:  Alert, Appropriate and Oriented  Insight: Improving  Engagement in Group:  Engaged  Modes of Intervention:  Discussion, Education and Problem-solving  Additional Comments:  Pt. Actively contributed and able to clarify role of a "sponsor" to peer.   Michaelle Birks 08/20/2017, 7:16 PM

## 2017-08-20 NOTE — Progress Notes (Signed)
[facility] MD Progress Note  08/20/2017 3:58 PM Sherry [name]  MRN:  [id number]   Subjective:  Patient states that overall she is feeling better than she did prior to admission. She focuses on insomnia as a significant ongoing issue. She thinks Depakote, which is currently dosed at QHS, may be contributing to restless sleep and decreased quality of sleep. She is on Remeron, but does not feel this medication is significantly helping her sleep. States Trazodone was poorly tolerated .    Objective:  I have discussed case with treatment team and have met with patient. Patient reports ongoing depression, anxiety, but does acknowledge some improvement compared to how she felt prior to admission. At this time denies suicidal ideations and is future oriented. States she plans to go to an intensive outpatient setting at discharge .  We discussed ECT further, and reviewed rationale for ECT as an alternative  treatment option to manage severe mood disorder/symptoms without pharmacotherapy. At this time, based on improving presentation and on patient agreeing to Depakote ER trial for management of mood disorder, we agreed to continue current management and not pursue ECT course at this time, although she is now aware of this treatment modality as a possible option in the future if indicated . As above, patient focuses on insomnia today, states she has been experiencing restless and fragmented sleep which she attributes to Depakote , whichj she is getting at QHS dosing . She wants to change the medication to AM to minimize negative impact on sleep. Denies suicidal ideations. Of note, abnormal oro-buccal movements have become less apparent, less prominent, although still noticeable. They are significantly more noticeable when patient is wearing her dentures.      Principal Problem: MDD (major depressive disorder), recurrent severe, without psychosis ([place]) Diagnosis:   Patient Active Problem List   Diagnosis Date Noted   . Substance induced mood disorder ([place]) [F19.94] 08/13/2017  . AKI (acute kidney injury) ([place]) [N17.9]   . MDD (major depressive disorder), recurrent severe, without psychosis ([place]) [F33.2]   . Alcohol withdrawal ([place]) [F10.239] 08/09/2017  . Essential hypertension [I10] 07/06/2017  . Chronic kidney disease, stage III (moderate) ([place]) [N18.3] 11/17/2016  . Anxiety [F41.9] 08/21/2016  . Bipolar disorder ([place]) [F31.9] 01/31/2016  . History of breast cancer [Z85.3] 02/13/2013  . Obesity [E66.9] 02/13/2013  . Chronic hepatitis C ([place]) [B18.2] 12/17/2012   Total Time spent with patient: 20 minutes  Past Psychiatric History: See H&P  Past Medical History:  Past Medical History:  Diagnosis Date  . Cancer ([place])   . Chronic back pain   . Depression (emotion)   . Hepatitis C   . Hypertension   . Opioid dependence ([place])    in [place]    Past Surgical History:  Procedure Laterality Date  . ABDOMINAL HYSTERECTOMY    . BACK SURGERY    . CHOLECYSTECTOMY    . MYOMECTOMY     Family History:  Family History  Family history unknown: Yes   Family Psychiatric  History: See H&P Social History:  Social History   Substance and Sexual Activity  Alcohol Use No     Social History   Substance and Sexual Activity  Drug Use No    Social History   Socioeconomic History  . Marital status: Single    Spouse name: None  . Number of children: None  . Years of education: None  . Highest education level: None  Social Needs  . Financial resource strain: None  . Food  insecurity - worry: None  . Food insecurity - inability: None  . Transportation needs - medical: None  . Transportation needs - non-medical: None  Occupational History  . None  Tobacco Use  . Smoking status: Current Every Day Smoker    Packs/day: 1.00    Types: Cigarettes  . Smokeless tobacco: Never Used  Substance and Sexual Activity  . Alcohol use: No  . Drug use: No  . Sexual activity: None  Other Topics  Concern  . None  Social History Narrative  . None   Additional Social History:    Sleep: Fair  Appetite:  Good  Current Medications: Current Facility-Administered Medications  Medication Dose Route Frequency Provider Last Rate Last Dose  . alum & mag hydroxide-simeth (MAALOX/MYLANTA) 200-200-20 MG/5ML suspension 30 mL  30 mL Oral Q6H PRN Patriciaann Clan E, PA-C   30 mL at 08/20/17 1421  . [START ON 08/21/2017] divalproex (DEPAKOTE ER) 24 hr tablet 500 mg  500 mg Oral BH-q7a Cobos, Fernando A, MD      . gabapentin (NEURONTIN) capsule 300 mg  300 mg Oral TID Artist Beach, MD   300 mg at 08/20/17 1203  . hydrochlorothiazide (HYDRODIURIL) tablet 25 mg  25 mg Oral Daily Rankin, Shuvon B, NP   25 mg at 08/20/17 0942  . hydrOXYzine (ATARAX/VISTARIL) tablet 25 mg  25 mg Oral Q6H PRN Cobos, Myer Peer, MD   25 mg at 08/19/17 2010  . labetalol (NORMODYNE) tablet 200 mg  200 mg Oral BID Cobos, Fernando A, MD      . magnesium hydroxide (MILK OF MAGNESIA) suspension 30 mL  30 mL Oral Daily PRN Patriciaann Clan E, PA-C      . mirtazapine (REMERON) tablet 15 mg  15 mg Oral QHS Cobos, Myer Peer, MD   15 mg at 08/19/17 2136  . multivitamin with minerals tablet 1 tablet  1 tablet Oral Daily Money, Lowry Ram, FNP   1 tablet at 08/20/17 [phone]  . nicotine (NICODERM CQ - dosed in mg/24 hours) patch 21 mg  21 mg Transdermal Daily Izediuno, Laruth Bouchard, MD   21 mg at 08/20/17 0943  . pantoprazole (PROTONIX) EC tablet 20 mg  20 mg Oral BID Cobos, Myer Peer, MD      . ramelteon (ROZEREM) tablet 8 mg  8 mg Oral QHS Cobos, Fernando A, MD      . thiamine (VITAMIN B-1) tablet 100 mg  100 mg Oral Daily Money, Lowry Ram, FNP   100 mg at 08/20/17 1308    Lab Results:  Results for orders placed or performed during the hospital encounter of 08/12/17 (from the past 48 hour(s))  Urinalysis, Routine w reflex microscopic     Status: Abnormal   Collection Time: 08/18/17  6:32 PM  Result Value Ref Range   Color, Urine  YELLOW YELLOW   APPearance CLEAR CLEAR   Specific Gravity, Urine 1.013 1.005 - 1.030   pH 5.0 5.0 - 8.0   Glucose, UA NEGATIVE NEGATIVE mg/dL   Hgb urine dipstick NEGATIVE NEGATIVE   Bilirubin Urine NEGATIVE NEGATIVE   Ketones, ur NEGATIVE NEGATIVE mg/dL   Protein, ur 30 (A) NEGATIVE mg/dL   Nitrite NEGATIVE NEGATIVE   Leukocytes, UA NEGATIVE NEGATIVE   RBC / HPF 0-5 0 - 5 RBC/hpf   WBC, UA 0-5 0 - 5 WBC/hpf   Bacteria, UA RARE (A) NONE SEEN   Squamous Epithelial / LPF 0-5 (A) NONE SEEN   Mucus PRESENT  Comment: Performed at [facility], [place] [address], [place], [place] [address]  Urine Culture     Status: Abnormal   Collection Time: 08/18/17  6:32 PM  Result Value Ref Range   Specimen Description      URINE, CLEAN CATCH Performed at [facility], [place] [address] [place], [place] [address]    Special Requests      Normal Performed at [facility], [place] [address], [place], [place] [address]    Culture MULTIPLE SPECIES PRESENT, SUGGEST RECOLLECTION (A)    Report Status 08/20/2017 FINAL     Blood Alcohol level:  Lab Results  Component Value Date   ETH <10 93/81/8299    Metabolic Disorder Labs: No results found for: HGBA1C, MPG No results found for: PROLACTIN No results found for: CHOL, TRIG, HDL, CHOLHDL, VLDL, LDLCALC  Physical Findings: AIMS: Facial and Oral Movements Muscles of Facial Expression: None, normal Lips and Perioral Area: None, normal Jaw: None, normal Tongue: None, normal,Extremity Movements Upper (arms, wrists, hands, fingers): None, normal Lower (legs, knees, ankles, toes): None, normal, Trunk Movements Neck, shoulders, hips: None, normal, Overall Severity Severity of abnormal movements (highest score from questions above): None, normal Incapacitation due to abnormal movements: None, normal Patient's awareness of abnormal movements (rate only patient's report): No Awareness, Dental  Status Current problems with teeth and/or dentures?: No Does patient usually wear dentures?: No  CIWA:  CIWA-Ar Total: 2 COWS:  COWS Total Score: 1  Musculoskeletal: Strength & Muscle Tone: within normal limits Gait & Station: normal Patient leans: N/A  Psychiatric Specialty Exam: Physical Exam  Nursing note and vitals reviewed. Constitutional: She is oriented to person, place, and time. She appears well-developed and well-nourished.  Cardiovascular: Normal rate.  Respiratory: Effort normal.  Musculoskeletal: Normal range of motion.  Neurological: She is alert and oriented to person, place, and time.  Skin: Skin is warm.    Review of Systems  Constitutional: Negative.   HENT: Negative.   Eyes: Negative.   Respiratory: Negative.   Cardiovascular: Negative.   Gastrointestinal: Negative.   Genitourinary: Negative.   Musculoskeletal: Negative.   Skin: Negative.   Neurological: Negative.   Endo/Heme/Allergies: Negative.   Psychiatric/Behavioral: Positive for depression and suicidal ideas. The patient is nervous/anxious.   no headache, no chest pain, no shortness of breath, denies dysuria, no fever, no chills  Reports some lingering reflux/GERD symptoms   Blood pressure (!) 189/103, pulse 68, temperature (!) 97.5 F (36.4 C), temperature source Oral, resp. rate 18, height _0  (1.575 m), weight 99.3 kg (219 lb).Body mass index is 40.06 kg/m.  General Appearance: Fairly Groomed  Eye Contact:  Good  Speech:  Normal Rate  Volume:  Normal  Mood:  partially improved mood, states she is still feeling depressed   Affect:  less irritable, smiles at times during session  Thought Process:  Goal Directed and Descriptions of Associations: Intact  Orientation:  Other:  fully alert and attentive   Thought Content:  no hallucinations, no delusions, not internally preoccupied   Suicidal Thoughts:  No today denies suicidal or self injurious ideations, and contracts for safety on unit    Homicidal Thoughts:  No no homicidal or violent ideations  Memory:  recent and remote grossly intact   Judgement:  Fair- improving   Insight:  Fair- improving   Psychomotor Activity:  Normal- no psychomotor agitation   Concentration:  Concentration: Good and Attention Span: Good  Recall:  Good  Fund of Knowledge:  Good  Language:  Good  Akathisia:  No  Handed:  Right  AIMS (if indicated):    (+) involuntary oro-buccal movements which are chronic  Assets:  Communication Skills Desire for Improvement Financial Resources/Insurance Housing Social Support  ADL's:  Intact  Cognition:  WNL  Sleep:  Number of Hours: 6   Assessment - patient is presenting with partially improved mood and range of affect. Denies SI and presents future oriented, expressing for example a desire to pursue intensive outpatient treatment after discharge. Oro-buccal movements likely related to TD present but improved . Due to this finding we are managing her Mood Disorder without atypical antipsychotics and she is now on Depakote ER trial, which she seems to be responding to, but which she states is contributing to insomnia, restless sleep.    Treatment Plan Summary: Daily contact with patient to assess and evaluate symptoms and progress in treatment, Medication management and Plan is to:  Treatment Plan reviewed as below today 2/8 Encourage group and milieu participation to work on coping skills and symptom reduction Change  Depakote ER to 500   mgrs QAM for mood disorder - see rationale as above  Continue Remeron at 15 mgrs QHS for depression, anxiety , insomnia Start Rozerem 8 mgrs QHS for management of insomnia  Increase Labetalol further to  200 mgrs BID for management of HTN Increase  Protonix to 20 mgrs BID for GERD. Continue Neurontin  300 mg PO TID for chronic back pain, anxiety  Treatment team working on disposition planning - see above  Jenne Campus, MD 08/20/2017, 3:58 PM   Patient ID: [name], female   DOB: 30-Oct-1955, 62 y.o.   MRN: [id number]

## 2017-08-21 NOTE — Progress Notes (Signed)
Patient ID: Sherry [name], female   DOB: 03/29/1956, 62 y.o.   MRN: [id number]    D: Pt has been very flat and depressed on the unit today. She remained in the bed most of the day, she did not attend any groups nor did she engage in any treatment. Pt reported severe anxiety most of the day on the unit, she was given prn medication. Pt reported that the medication helped with her anxiety. Pt refused to fill out her patient self inventory sheet. Pt reported that she felt sick and that she just wanted to rest. Pt reported being negative SI/HI, no AH/VH noted. A: 15 min checks continued for patient safety. R: Pt safety maintained.

## 2017-08-21 NOTE — BHH Group Notes (Signed)
[facility] Group Notes:  (Nursing/MHT/Case Management/Adjunct)  Date:  08/21/2017  Time:  4:36 PM  Type of Therapy:  Orientation/Goals group  Participation Level:  Did Not Attend  Participation Quality:  Did Not Attend  Affect:  Did Not Attend  Cognitive:  Did Not Attend  Insight:  None  Engagement in Group:  Did Not Attend  Modes of Intervention:  Did Not Attend  Summary of Progress/Problems: Pt did not attend patient self inventory group/orientation group. Benancio Deeds Shanta 08/21/2017, 4:36 PM

## 2017-08-21 NOTE — Progress Notes (Signed)
[facility] MD Progress Note  08/21/2017 10:08 AM Sherry [name]  MRN:  [id number]   Subjective: Sherry [name] reports " I am okay, I think I am getting sick" reports congestion and sore throat states her symptom started last night.   Objective: Sherry [name] is awake, alert and oriented. Seen standing the nursing station. Continues to .  Denies suicidal or homicidal ideation. Denies auditory or visual hallucination and does not appear to be responding to internal stimuli. Patient reports she is interacting well with staff and others. Patient reports she is medication compliant without mediation side effects. Patient reports she is not feeling well today, symptoms noted above states she is hopeful to stay a behavioral healthy until she is feeling "all the way better" Patient has follow-up screening appointment  at [facility] on 08/23/2017. Discussed symptom management and encouraged hydration.  Support, encouragement and reassurance was provided.     Principal Problem: MDD (major depressive disorder), recurrent severe, without psychosis ([place]) Diagnosis:   Patient Active Problem List   Diagnosis Date Noted  . Substance induced mood disorder ([place]) [F19.94] 08/13/2017  . AKI (acute kidney injury) ([place]) [N17.9]   . MDD (major depressive disorder), recurrent severe, without psychosis ([place]) [F33.2]   . Alcohol withdrawal ([place]) [F10.239] 08/09/2017  . Essential hypertension [I10] 07/06/2017  . Chronic kidney disease, stage III (moderate) ([place]) [N18.3] 11/17/2016  . Anxiety [F41.9] 08/21/2016  . Bipolar disorder ([place]) [F31.9] 01/31/2016  . History of breast cancer [Z85.3] 02/13/2013  . Obesity [E66.9] 02/13/2013  . Chronic hepatitis C ([place]) [B18.2] 12/17/2012   Total Time spent with patient: 20 minutes  Past Psychiatric History: See H&P  Past Medical History:  Past Medical History:  Diagnosis Date  . Cancer ([place])   . Chronic back pain   . Depression (emotion)   . Hepatitis C   . Hypertension   . Opioid  dependence ([place])    in [place]    Past Surgical History:  Procedure Laterality Date  . ABDOMINAL HYSTERECTOMY    . BACK SURGERY    . CHOLECYSTECTOMY    . MYOMECTOMY     Family History:  Family History  Family history unknown: Yes   Family Psychiatric  History: See H&P Social History:  Social History   Substance and Sexual Activity  Alcohol Use No     Social History   Substance and Sexual Activity  Drug Use No    Social History   Socioeconomic History  . Marital status: Single    Spouse name: None  . Number of children: None  . Years of education: None  . Highest education level: None  Social Needs  . Financial resource strain: None  . Food insecurity - worry: None  . Food insecurity - inability: None  . Transportation needs - medical: None  . Transportation needs - non-medical: None  Occupational History  . None  Tobacco Use  . Smoking status: Current Every Day Smoker    Packs/day: 1.00    Types: Cigarettes  . Smokeless tobacco: Never Used  Substance and Sexual Activity  . Alcohol use: No  . Drug use: No  . Sexual activity: None  Other Topics Concern  . None  Social History Narrative  . None   Additional Social History:    Sleep: Fair  Appetite:  Good  Current Medications: Current Facility-Administered Medications  Medication Dose Route Frequency Provider Last Rate Last Dose  . alum & mag hydroxide-simeth (MAALOX/MYLANTA) 200-200-20 MG/5ML suspension 30 mL  30 mL Oral  Q6H PRN Patriciaann Clan E, PA-C   30 mL at 08/20/17 2303  . divalproex (DEPAKOTE ER) 24 hr tablet 500 mg  500 mg Oral BH-q7a Staley Lunz, Myer Peer, MD   500 mg at 08/21/17 0958  . gabapentin (NEURONTIN) capsule 300 mg  300 mg Oral TID Artist Beach, MD   300 mg at 08/21/17 0958  . hydrochlorothiazide (HYDRODIURIL) tablet 25 mg  25 mg Oral Daily Rankin, Shuvon B, NP   25 mg at 08/21/17 0958  . hydrOXYzine (ATARAX/VISTARIL) tablet 25 mg  25 mg Oral Q6H PRN Haniyyah Sakuma, Myer Peer, MD    25 mg at 08/20/17 2218  . labetalol (NORMODYNE) tablet 200 mg  200 mg Oral BID Javion Holmer, Myer Peer, MD   200 mg at 08/21/17 0958  . magnesium hydroxide (MILK OF MAGNESIA) suspension 30 mL  30 mL Oral Daily PRN Laverle Hobby, PA-C      . mirtazapine (REMERON) tablet 15 mg  15 mg Oral QHS Steaven Wholey, Myer Peer, MD   15 mg at 08/20/17 2218  . multivitamin with minerals tablet 1 tablet  1 tablet Oral Daily Money, Lowry Ram, FNP   1 tablet at 08/21/17 [phone]  . nicotine (NICODERM CQ - dosed in mg/24 hours) patch 21 mg  21 mg Transdermal Daily Izediuno, Laruth Bouchard, MD   21 mg at 08/21/17 0959  . pantoprazole (PROTONIX) EC tablet 20 mg  20 mg Oral BID Lilyana Lippman, Myer Peer, MD   20 mg at 08/21/17 0958  . ramelteon (ROZEREM) tablet 8 mg  8 mg Oral QHS Woodruff Skirvin, Myer Peer, MD   8 mg at 08/20/17 2218  . thiamine (VITAMIN B-1) tablet 100 mg  100 mg Oral Daily Money, Lowry Ram, FNP   100 mg at 08/21/17 0962    Lab Results:  No results found for this or any previous visit (from the past 48 hour(s)).  Blood Alcohol level:  Lab Results  Component Value Date   ETH <10 83/66/2947    Metabolic Disorder Labs: No results found for: HGBA1C, MPG No results found for: PROLACTIN No results found for: CHOL, TRIG, HDL, CHOLHDL, VLDL, LDLCALC  Physical Findings: AIMS: Facial and Oral Movements Muscles of Facial Expression: None, normal Lips and Perioral Area: None, normal Jaw: None, normal Tongue: None, normal,Extremity Movements Upper (arms, wrists, hands, fingers): None, normal Lower (legs, knees, ankles, toes): None, normal, Trunk Movements Neck, shoulders, hips: None, normal, Overall Severity Severity of abnormal movements (highest score from questions above): None, normal Incapacitation due to abnormal movements: None, normal Patient's awareness of abnormal movements (rate only patient's report): No Awareness, Dental Status Current problems with teeth and/or dentures?: No Does patient usually wear dentures?: No   CIWA:  CIWA-Ar Total: 2 COWS:  COWS Total Score: 1  Musculoskeletal: Strength & Muscle Tone: within normal limits Gait & Station: normal Patient leans: N/A  Psychiatric Specialty Exam: Physical Exam  Nursing note and vitals reviewed. Constitutional: She is oriented to person, place, and time. She appears well-developed and well-nourished.  Cardiovascular: Normal rate.  Respiratory: Effort normal.  Musculoskeletal: Normal range of motion.  Neurological: She is alert and oriented to person, place, and time.  Skin: Skin is warm.    Review of Systems  HENT: Positive for sore throat.   Musculoskeletal: Negative.   Neurological: Positive for headaches.  Psychiatric/Behavioral: Positive for depression and suicidal ideas. The patient is nervous/anxious.   All other systems reviewed and are negative.   Blood pressure (!) 174/79, pulse 69, temperature (!)  97.5 F (36.4 C), temperature source Oral, resp. rate 18, height 5\' 2"  (1.575 m), weight 99.3 kg (219 lb).Body mass index is 40.06 kg/m.  General Appearance: improving grooming   Eye Contact:  Good  Speech:  Normal Rate  Volume:  Normal  Mood:  Anxious and Depressed  Affect:  Appropriate and Congruent  Thought Process:  Goal Directed and Linear  Orientation:  Full (Time, Place, and Person)  Thought Content:  Hallucinations: None  Suicidal Thoughts:  intermittent passive SI, denies plan or intention of hurting self or of suicide and contracts for safety on unit at this time   Homicidal Thoughts:  No   Memory:  recent and remote grossly intact   Judgement:  Fair- improving   Insight:  Fair- improving   Psychomotor Activity:  Normal  Concentration:  Concentration: Good and Attention Span: Good  Recall:  Good  Fund of Knowledge:  Good  Language:  Good  Akathisia:  No  Handed:  Right  AIMS (if indicated):    significant involuntary oro-buccal movements which are chronic  Assets:  Communication Skills Desire for  Improvement Financial Resources/Insurance Housing Social Support  ADL's:  Intact  Cognition:  WNL  Sleep:  Number of Hours: 6.25      Treatment Plan Summary: Daily contact with patient to assess and evaluate symptoms and progress in treatment, Medication management and Plan is to:   Continue with current treatment plan on 08/21/2017 except where noted   Continue   Depakote ER to 500   mgrs QHS for mood disorder  Continue Remeron at 15 mgrs QHS for depression, anxiety , insomnia  Continue  Labetalol to 200 mgrs QAM and 100 mgrs QHS for management of HTN Continue  Protonix 20 mgrs QDAY for GERD. Continue Neurontin  300 mg PO TID for chronic back pain, anxiety   Encourage group and milieu participation to work on coping skills and symptom reduction Patient considering ECT as a potential treatment option SW to continue working on discharge disposition   Derrill Center, NP 08/21/2017, 10:08 AM  Agree with NP Progress Note

## 2017-08-21 NOTE — BHH Group Notes (Signed)
LCSW Group Therapy Note  08/21/2017 9:30-10:30AM - 300 [address], 10:30-11:30 - 400 [address], 11:30-12:00 - 500 Hall  Type of Therapy and Topic:  Group Therapy: Anger Cues and Responses  Participation Level:  None   Description of Group:   In this group, patients learned how to recognize the physical, cognitive, emotional, and behavioral responses they have to anger-provoking situations.  They identified a recent time they became angry and how they reacted.  They analyzed how their reaction was possibly beneficial and how it was possibly unhelpful.  The group discussed a variety of healthier coping skills that could help with such a situation in the future.  Deep breathing was practiced briefly.  Therapeutic Goals: 1. Patients will remember their last incident of anger and how they felt emotionally and physically, what their thoughts were at the time, and how they behaved. 2. Patients will identify how their behavior at that time worked for them, as well as how it worked against them. 3. Patients will explore possible new behaviors to use in future anger situations. 4. Patients will learn that anger itself is normal and cannot be eliminated, and that healthier reactions can assist with resolving conflict rather than worsening situations.  Summary of Patient Progress:  The patient was 30 minutes late to group and only sat there a few moments before leaving again.  Therapeutic Modalities:   Cognitive Behavioral Therapy  Sherry [name]  08/21/2017 8:31 AM

## 2017-08-21 NOTE — Progress Notes (Signed)
D    Pt is pleasant on approach and has been cooperative   She stayed in bed while  group was going on but got up for snack and medications   She complained of having gas and a sore throat and received medications for same  A    Verbal support given   Medications administered and effectiveness monitored    Q 15 min checks R   Pt is safe at present time and was receptive to verbal support

## 2017-08-21 NOTE — BHH Group Notes (Signed)
[place] Group Notes:  (Nursing/MHT/Case Management/Adjunct)  Date:  08/21/2017  Time:  4:42 PM  Type of Therapy:  Psychoeducational Skills  Participation Level:  Did Not Attend  Participation Quality:  Did not attend  Affect:  Did not attend  Cognitive:  Did not attend  Insight:  None  Engagement in Group:  Did not attend  Modes of Intervention:  Did not attend  Summary of Progress/Problems: Pt did not attend Psychoeducational group with topic anger management.   Benancio Deeds Shanta 08/21/2017, 4:42 PM

## 2017-08-22 ENCOUNTER — Telehealth (HOSPITAL_COMMUNITY): Payer: Self-pay | Admitting: Psychology

## 2017-08-22 NOTE — BHH Group Notes (Signed)
[facility] LCSW Group Therapy Note  Date/Time:  08/22/2017 10:00-11:00AM  Type of Therapy and Topic:  Group Therapy:  Healthy and Unhealthy Supports  Participation Level:  Active   Description of Group:  Patients in this group were introduced to the idea of adding a variety of healthy supports to address the various needs in their lives.Patients discussed what additional healthy supports could be helpful in their recovery and wellness after discharge in order to prevent future hospitalizations.   An emphasis was placed on using counselor, doctor, therapy groups, 12-step groups, and problem-specific support groups to expand supports.  They also worked as a group on developing a specific plan for several patients to deal with unhealthy supports through [place], psychoeducation with loved ones, and even termination of relationships.   Therapeutic Goals:   1)  discuss importance of adding supports to stay well once out of the hospital  2)  compare healthy versus unhealthy supports and identify some examples of each  3)  generate ideas and descriptions of healthy supports that can be added  4)  offer mutual support about how to address unhealthy supports  5)  encourage active participation in and adherence to discharge plan    Summary of Patient Progress:  The patient expressed a willingness to add Intensive Outpatient groups three days a week to help in her recovery journey.  She feels that Gabapentin is not working for her and she needs to eliminate that, has talked to her doctor already about it.  She was somewhat late to group, left then returned, and only spoke a few times.   Therapeutic Modalities:   Motivational Interviewing Brief Solution-Focused Therapy  Selmer Dominion, LCSW

## 2017-08-22 NOTE — Progress Notes (Signed)
D    Pt is pleasant on approach and has been cooperative   She stayed in bed while  group was going on but got up for snack and medications   She complained of having gas and a sore throat and received medications for same   She does endorse depression and anxiety A    Verbal support given   Medications administered and effectiveness monitored    Q 15 min checks R   Pt is safe at present time and was receptive to verbal support

## 2017-08-22 NOTE — Progress Notes (Signed)
Patient ID: Sherry [name], female   DOB: [birth year], 62 y.o.   MRN: [id number]    D: Pt has been very flat and depressed on the unit today. She remained in the bed most of the day, she did not attend any groups nor did she engage in any treatment. Pt reported severe anxiety most of the day on the unit, she was given prn medication. Pt reported that the medication helped with her anxiety. Pt refused to fill out her patient self inventory sheet. Pt reported that she felt sick and that she just wanted to rest. Pt reported being negative SI/HI, no AH/VH noted. A: 15 min checks continued for patient safety. R: Pt safety maintained

## 2017-08-22 NOTE — BHH Group Notes (Signed)
[place] Group Notes:  (Nursing/MHT/Case Management/Adjunct)  Date:  08/22/2017  Time:  11:49 AM  Type of Therapy:  Orientation/Goals group  Participation Level:  Did Not Attend  Participation Quality:  Did Not Attend  Affect:  Did Not Attend  Cognitive:  Did Not Attend  Insight:  None  Engagement in Group:  Did Not Attend  Modes of Intervention:  Did Not Attend  Summary of Progress/Problems: Pt did not attend patient self inventory group/orientation group.   Benancio Deeds Shanta 08/22/2017, 11:49 AM

## 2017-08-22 NOTE — BHH Group Notes (Signed)
[place] Group Notes:  (Nursing/MHT/Case Management/Adjunct)  Date:  08/22/2017  Time:  1:53 PM  Type of Therapy:  Psychoeducational Skills  Participation Level:  Did Not Attend  Participation Quality:  Did not attend  Affect:  Did not attend  Cognitive:  Did not attend  Insight:  None  Engagement in Group:  Did not attend  Modes of Intervention:  Did not attend  Summary of Progress/Problems: Pt did not attend Psychoeducational group with topic healthy support systems.   Benancio Deeds Shanta 08/22/2017, 1:53 PM

## 2017-08-22 NOTE — Progress Notes (Signed)
[facility] MD Progress Note  08/22/2017 10:35 AM Sherry [name]  MRN:  [id number]   Subjective: Sherry [name] reports " My throat is still sore, I don't think I should leave tomorrow  feeling like this." - patient reports concerns of restless night on last night.     Objective: Sherry [name] seen resting in bedroom. Patient present with multiple somatic complaints this assessment. Reports she wants to detox from her gabapentin while she is here. Patient was encouraged to make appointment with primary care provider.  States she is no longer interested ECT options.  States she is restless with some of her medications. mainly at night.  Denies suicidal or homicidal ideation. Denies auditory or visual hallucination and does not appear to be responding to internal stimuli. Patient reports she is interacting well with staff and other. Discussed symptom management and encouraged hydration.  Support, encouragement and reassurance was provided.     Principal Problem: MDD (major depressive disorder), recurrent severe, without psychosis ([place]) Diagnosis:   Patient Active Problem List   Diagnosis Date Noted  . Substance induced mood disorder ([place]) [F19.94] 08/13/2017  . AKI (acute kidney injury) ([place]) [N17.9]   . MDD (major depressive disorder), recurrent severe, without psychosis ([place]) [F33.2]   . Alcohol withdrawal ([place]) [F10.239] 08/09/2017  . Essential hypertension [I10] 07/06/2017  . Chronic kidney disease, stage III (moderate) ([place]) [N18.3] 11/17/2016  . Anxiety [F41.9] 08/21/2016  . Bipolar disorder ([place]) [F31.9] 01/31/2016  . History of breast cancer [Z85.3] 02/13/2013  . Obesity [E66.9] 02/13/2013  . Chronic hepatitis C ([place]) [B18.2] 12/17/2012   Total Time spent with patient: 20 minutes  Past Psychiatric History: See H&P  Past Medical History:  Past Medical History:  Diagnosis Date  . Cancer ([place])   . Chronic back pain   . Depression (emotion)   . Hepatitis C   . Hypertension   . Opioid  dependence ([place])    in [place]    Past Surgical History:  Procedure Laterality Date  . ABDOMINAL HYSTERECTOMY    . BACK SURGERY    . CHOLECYSTECTOMY    . MYOMECTOMY     Family History:  Family History  Family history unknown: Yes   Family Psychiatric  History: See H&P Social History:  Social History   Substance and Sexual Activity  Alcohol Use No     Social History   Substance and Sexual Activity  Drug Use No    Social History   Socioeconomic History  . Marital status: Single    Spouse name: None  . Number of children: None  . Years of education: None  . Highest education level: None  Social Needs  . Financial resource strain: None  . Food insecurity - worry: None  . Food insecurity - inability: None  . Transportation needs - medical: None  . Transportation needs - non-medical: None  Occupational History  . None  Tobacco Use  . Smoking status: Current Every Day Smoker    Packs/day: 1.00    Types: Cigarettes  . Smokeless tobacco: Never Used  Substance and Sexual Activity  . Alcohol use: No  . Drug use: No  . Sexual activity: None  Other Topics Concern  . None  Social History Narrative  . None   Additional Social History:    Sleep: Fair- reports restless night last night due to congestion and sore throat.   Appetite:  Good  Current Medications: Current Facility-Administered Medications  Medication Dose Route Frequency Provider Last Rate Last Dose  .  alum & mag hydroxide-simeth (MAALOX/MYLANTA) 200-200-20 MG/5ML suspension 30 mL  30 mL Oral Q6H PRN Laverle Hobby, PA-C   30 mL at 08/21/17 2235  . divalproex (DEPAKOTE ER) 24 hr tablet 500 mg  500 mg Oral BH-q7a Cobos, Myer Peer, MD   500 mg at 08/22/17 1017  . gabapentin (NEURONTIN) capsule 300 mg  300 mg Oral TID Artist Beach, MD   300 mg at 08/21/17 1622  . hydrochlorothiazide (HYDRODIURIL) tablet 25 mg  25 mg Oral Daily Rankin, Shuvon B, NP   25 mg at 08/22/17 1017  . hydrOXYzine  (ATARAX/VISTARIL) tablet 25 mg  25 mg Oral Q6H PRN Cobos, Fernando A, MD   25 mg at 08/22/17 1016  . labetalol (NORMODYNE) tablet 200 mg  200 mg Oral BID Cobos, Myer Peer, MD   200 mg at 08/22/17 1016  . magnesium hydroxide (MILK OF MAGNESIA) suspension 30 mL  30 mL Oral Daily PRN Patriciaann Clan E, PA-C      . mirtazapine (REMERON) tablet 15 mg  15 mg Oral QHS Cobos, Myer Peer, MD   15 mg at 08/21/17 2149  . multivitamin with minerals tablet 1 tablet  1 tablet Oral Daily Money, Lowry Ram, FNP   1 tablet at 08/22/17 1016  . nicotine (NICODERM CQ - dosed in mg/24 hours) patch 21 mg  21 mg Transdermal Daily Izediuno, Laruth Bouchard, MD   21 mg at 08/21/17 0959  . pantoprazole (PROTONIX) EC tablet 20 mg  20 mg Oral BID Cobos, Myer Peer, MD   20 mg at 08/22/17 1017  . ramelteon (ROZEREM) tablet 8 mg  8 mg Oral QHS Cobos, Myer Peer, MD   8 mg at 08/21/17 2149  . thiamine (VITAMIN B-1) tablet 100 mg  100 mg Oral Daily Money, Lowry Ram, FNP   100 mg at 08/22/17 1016    Lab Results:  No results found for this or any previous visit (from the past 48 hour(s)).  Blood Alcohol level:  Lab Results  Component Value Date   ETH <10 12/75/1700    Metabolic Disorder Labs: No results found for: HGBA1C, MPG No results found for: PROLACTIN No results found for: CHOL, TRIG, HDL, CHOLHDL, VLDL, LDLCALC  Physical Findings: AIMS: Facial and Oral Movements Muscles of Facial Expression: None, normal Lips and Perioral Area: None, normal Jaw: None, normal Tongue: None, normal,Extremity Movements Upper (arms, wrists, hands, fingers): None, normal Lower (legs, knees, ankles, toes): None, normal, Trunk Movements Neck, shoulders, hips: None, normal, Overall Severity Severity of abnormal movements (highest score from questions above): None, normal Incapacitation due to abnormal movements: None, normal Patient's awareness of abnormal movements (rate only patient's report): No Awareness, Dental Status Current problems  with teeth and/or dentures?: No Does patient usually wear dentures?: No  CIWA:  CIWA-Ar Total: 2 COWS:  COWS Total Score: 1  Musculoskeletal: Strength & Muscle Tone: within normal limits Gait & Station: normal Patient leans: N/A  Psychiatric Specialty Exam: Physical Exam  Nursing note and vitals reviewed. Constitutional: She is oriented to person, place, and time. She appears well-developed and well-nourished.  Cardiovascular: Normal rate.  Respiratory: Effort normal.  Musculoskeletal: Normal range of motion.  Neurological: She is alert and oriented to person, place, and time.  Skin: Skin is warm.    Review of Systems  HENT: Positive for sore throat.   Neurological: Positive for headaches.  Psychiatric/Behavioral: Positive for depression and suicidal ideas. The patient is nervous/anxious.   All other systems reviewed and are negative.  Blood pressure (!) 158/66, pulse 72, temperature (!) 97.5 F (36.4 C), temperature source Oral, resp. rate 18, height 5\' 2"  (1.575 m), weight 99.3 kg (219 lb).Body mass index is 40.06 kg/m.  General Appearance: Casual  Eye Contact:  Good  Speech:  Normal Rate  Volume:  Normal  Mood:  Anxious and Depressed  Affect:  Appropriate and Congruent  Thought Process:  Goal Directed and Linear  Orientation:  Full (Time, Place, and Person)  Thought Content:  Hallucinations: None  Suicidal Thoughts:  intermittent passive SI, denies plan or intention of hurting self or of suicide and contracts for safety on unit at this time   Homicidal Thoughts:  No   Memory:  recent and remote grossly intact   Judgement:  Fair   Insight:  Fair  Psychomotor Activity:  Normal  Concentration:  Concentration: Good and Attention Span: Good  Recall:  Good  Fund of Knowledge:  Good  Language:  Good  Akathisia:  No  Handed:  Right  AIMS (if indicated):    significant involuntary oro-buccal movements which are chronic  Assets:  Communication Skills Desire for  Improvement Financial Resources/Insurance Housing Social Support  ADL's:  Intact  Cognition:  WNL  Sleep:  Number of Hours: 4.25      Treatment Plan Summary: Daily contact with patient to assess and evaluate symptoms and progress in treatment, Medication management and Plan is to:   Continue with current treatment plan on 08/22/2017 except where noted   Continue   Depakote ER to 500   mgrs QHS for mood disorder  Continue Remeron at 15 mgrs QHS for depression, anxiety , insomnia  Continue  Labetalol to 200 mgrs QAM and 100 mgrs QHS for management of HTN Continue  Protonix 20 mgrs QDAY for GERD. Continue Neurontin  300 mg PO TID for chronic back pain, anxiety   Encourage group and milieu participation to work on coping skills and symptom reduction Patient considering ECT as a potential treatment option SW to continue working on discharge disposition   Derrill [place], NP 08/22/2017, 10:35 AM   Agree with NP Progress Note

## 2017-08-23 MED ORDER — HYDRALAZINE HCL 25 MG PO TABS
25.0000 mg | ORAL_TABLET | Freq: Four times a day (QID) | ORAL | Status: DC | PRN
  Administered 2017-08-24: 25 mg via ORAL
  Filled 2017-08-23: qty 1

## 2017-08-23 MED ORDER — HYDROCHLOROTHIAZIDE 50 MG PO TABS
50.0000 mg | ORAL_TABLET | Freq: Every day | ORAL | Status: DC
  Administered 2017-08-24 – 2017-08-25 (×2): 50 mg via ORAL
  Filled 2017-08-23: qty 1
  Filled 2017-08-23: qty 2
  Filled 2017-08-23 (×3): qty 1

## 2017-08-23 MED ORDER — GABAPENTIN 400 MG PO CAPS
400.0000 mg | ORAL_CAPSULE | Freq: Three times a day (TID) | ORAL | Status: DC
  Administered 2017-08-23 – 2017-08-25 (×6): 400 mg via ORAL
  Filled 2017-08-23 (×12): qty 1

## 2017-08-23 MED ORDER — FERROUS SULFATE 325 (65 FE) MG PO TABS
325.0000 mg | ORAL_TABLET | Freq: Two times a day (BID) | ORAL | Status: DC
  Administered 2017-08-23 – 2017-08-25 (×4): 325 mg via ORAL
  Filled 2017-08-23 (×8): qty 1

## 2017-08-23 MED ORDER — POLYETHYLENE GLYCOL 3350 17 G PO PACK
17.0000 g | PACK | Freq: Every day | ORAL | Status: DC
  Filled 2017-08-23 (×5): qty 1

## 2017-08-23 MED ORDER — HYDROCHLOROTHIAZIDE 25 MG PO TABS
25.0000 mg | ORAL_TABLET | Freq: Once | ORAL | Status: AC
  Administered 2017-08-23: 25 mg via ORAL
  Filled 2017-08-23: qty 1

## 2017-08-23 NOTE — BHH Group Notes (Addendum)
[place] Group Notes:  (Nursing/MHT/Case Management/Adjunct)  Date:  08/23/2017  Time:  4:00 p.m.  Type of Therapy:  Psychoeducational Skills  Participation Level:  Did Not Attend  Participation Quality:    Affect:    Cognitive:    Insight:    Engagement in Group:    Modes of Intervention:    Summary of Progress/Problems:  Patient did not attend.  Cammy Copa 08/23/2017, 6:09 PM

## 2017-08-23 NOTE — Progress Notes (Signed)
D: Pt was in the dayroom playing cards with peers upon initial approach.  Pt presents with depressed affect and mood.  She reports her day was "not good, that medicine they gave me during the day.. I have to take it at night because it makes me sleep."  She denies SI but reports she has "fear of suicide in the future if I'm discharged."  Pt was asked about her aftercare plans and she reports she plans to follow up with the social worker tomorrow.  As of now, she reports she is "going home" and doing intensive outpatient treatment.  Pt denies HI, denies hallucinations, denies pain.  Pt has been visible in milieu interacting with peers and staff appropriately.  Pt attended evening group.    A: Introduced self to pt.  Actively listened to pt and offered support and encouragement. Medications administered per order.  PRN medication administered for indigestion.  Q15 minute safety checks maintained.  R: Pt is safe on the unit.  Pt is compliant with medications.  Pt verbally contracts for safety.  Will continue to monitor and assess.

## 2017-08-23 NOTE — Tx Team (Signed)
Interdisciplinary Treatment and Diagnostic Plan Update  08/23/2017 Time of Session: 0830AM Sherry [name] MRN: [id number]  Principal Diagnosis: MDD, recurrent, severe, with psychosis  Secondary Diagnoses: Principal Problem:   MDD (major depressive disorder), recurrent severe, without psychosis ([place]) Active Problems:   Substance induced mood disorder ([place])   Current Medications:  Current Facility-Administered Medications  Medication Dose Route Frequency Provider Last Rate Last Dose  . alum & mag hydroxide-simeth (MAALOX/MYLANTA) 200-200-20 MG/5ML suspension 30 mL  30 mL Oral Q6H PRN Laverle Hobby, PA-C   30 mL at 08/22/17 2201  . divalproex (DEPAKOTE ER) 24 hr tablet 500 mg  500 mg Oral BH-q7a Cobos, Myer Peer, MD   500 mg at 08/23/17 8889  . gabapentin (NEURONTIN) capsule 300 mg  300 mg Oral TID Artist Beach, MD   300 mg at 08/22/17 1632  . hydrochlorothiazide (HYDRODIURIL) tablet 25 mg  25 mg Oral Daily Rankin, Shuvon B, NP   25 mg at 08/23/17 0642  . hydrOXYzine (ATARAX/VISTARIL) tablet 25 mg  25 mg Oral Q6H PRN Cobos, Myer Peer, MD   25 mg at 08/22/17 2126  . labetalol (NORMODYNE) tablet 200 mg  200 mg Oral BID Cobos, Myer Peer, MD   200 mg at 08/22/17 1631  . magnesium hydroxide (MILK OF MAGNESIA) suspension 30 mL  30 mL Oral Daily PRN Laverle Hobby, PA-C      . mirtazapine (REMERON) tablet 15 mg  15 mg Oral QHS Cobos, Myer Peer, MD   15 mg at 08/22/17 2126  . multivitamin with minerals tablet 1 tablet  1 tablet Oral Daily Money, Lowry Ram, FNP   1 tablet at 08/22/17 1016  . nicotine (NICODERM CQ - dosed in mg/24 hours) patch 21 mg  21 mg Transdermal Daily Izediuno, Laruth Bouchard, MD   21 mg at 08/21/17 0959  . pantoprazole (PROTONIX) EC tablet 20 mg  20 mg Oral BID Cobos, Myer Peer, MD   20 mg at 08/22/17 1632  . ramelteon (ROZEREM) tablet 8 mg  8 mg Oral QHS Cobos, Myer Peer, MD   8 mg at 08/21/17 2149  . thiamine (VITAMIN B-1) tablet 100 mg  100 mg Oral Daily Money,  Lowry Ram, FNP   100 mg at 08/22/17 1016   PTA Medications: Medications Prior to Admission  Medication Sig Dispense Refill Last Dose  . FLUoxetine (PROZAC) 20 MG capsule Take 20 mg by mouth daily.   08/08/2017 at Unknown time  . folic acid (FOLVITE) 1 MG tablet Take 1 tablet (1 mg total) by mouth daily.     . hydrochlorothiazide (HYDRODIURIL) 25 MG tablet Take 25 mg by mouth daily.   08/09/2017 at Unknown time  . labetalol (NORMODYNE) 100 MG tablet Take 3 tablets (300 mg total) by mouth 2 (two) times daily.     . pantoprazole (PROTONIX) 20 MG tablet Take 1 tablet (20 mg total) by mouth daily. (Patient not taking: Reported on 08/09/2017) 14 tablet 0 Not Taking at Unknown time  . sucralfate (CARAFATE) 1 G tablet Take 1 tablet (1 g total) by mouth 4 (four) times daily -  with meals and at bedtime. (Patient not taking: Reported on 08/09/2017) 90 tablet 0 Not Taking at Unknown time  . thiamine 100 MG tablet Take 1 tablet (100 mg total) by mouth daily.     Marland Kitchen zolpidem (AMBIEN) 10 MG tablet Take 10 mg by mouth at bedtime as needed.   unk    Patient Stressors: Financial difficulties Health problems Substance abuse  Patient Strengths: Capable of independent living Curator fund of knowledge Motivation for treatment/growth  Treatment Modalities: Medication Management, Group therapy, Case management,  1 to 1 session with clinician, Psychoeducation, Recreational therapy.   Physician Treatment Plan for Primary Diagnosis: MDD, recurrent, severe, with psychosis  Medication Management: Evaluate patient's response, side effects, and tolerance of medication regimen.  Therapeutic Interventions: 1 to 1 sessions, Unit Group sessions and Medication administration.  Evaluation of Outcomes: Progressing   Physician Treatment Plan for Secondary Diagnosis: Principal Problem:   MDD (major depressive disorder), recurrent severe, without psychosis ([place]) Active Problems:   Substance induced  mood disorder ([place])   Medication Management: Evaluate patient's response, side effects, and tolerance of medication regimen.  Therapeutic Interventions: 1 to 1 sessions, Unit Group sessions and Medication administration.  Evaluation of Outcomes: Progressing  RN Treatment Plan for Primary Diagnosis: MDD, recurrent, severe, with psychosis Long Term Goal(s): Knowledge of disease and therapeutic regimen to maintain health will improve  Short Term Goals: Ability to remain free from injury will improve, Ability to disclose and discuss suicidal ideas and Ability to identify and develop effective coping behaviors will improve  Medication Management: RN will administer medications as ordered by provider, will assess and evaluate patient's response and provide education to patient for prescribed medication. RN will report any adverse and/or side effects to prescribing provider.  Therapeutic Interventions: 1 on 1 counseling sessions, Psychoeducation, Medication administration, Evaluate responses to treatment, Monitor vital signs and CBGs as ordered, Perform/monitor CIWA, COWS, AIMS and Fall Risk screenings as ordered, Perform wound care treatments as ordered.  Evaluation of Outcomes: Progressing  LCSW Treatment Plan for Primary Diagnosis: MDD, recurrent, severe, with psychosis Long Term Goal(s): Safe transition to appropriate next level of care at discharge, Engage patient in therapeutic group addressing interpersonal concerns.  Short Term Goals: Engage patient in aftercare planning with referrals and resources, Facilitate patient progression through stages of change regarding substance use diagnoses and concerns and Identify triggers associated with mental health/substance abuse issues  Therapeutic Interventions: Assess for all discharge needs, 1 to 1 time with Social worker, Explore available resources and support systems, Assess for adequacy in community support network, Educate family and  significant other(s) on suicide prevention, Complete Psychosocial Assessment, Interpersonal group therapy.  Evaluation of Outcomes: Progressing  Progress in Treatment: Attending groups: Yes Participating in groups: Yes Taking medication as prescribed: Yes. Toleration medication: Yes. Family/Significant other contact made: Contact attempts made with pt's sister. SPE completed with pt.  Patient understands diagnosis: Yes. Discussing patient identified problems/goals with staff: Yes. Medical problems stabilized or resolved: Yes. Denies suicidal/homicidal ideation: Yes. Issues/concerns per patient self-inventory: No. Other: n/a   New problem(s) identified: No, Describe:  n/a  New Short Term/Long Term Goal(s): detox, medication management for mood stabilization; elimination of SI thoughts; development of comprehensive mental wellness/sobriety plan.   Patient Goal: "to get better. I want therapy, medication, and coping skills."   Discharge Plan or Barriers: Pt plans to discharge home; follow-up at [facility] but pt will have to pay 20% out of pocket per Brandon Melnick. CSW assessing other appropriate options for her at this time. [facility] possibly, or [facility].   Reason for Continuation of Hospitalization: none  Estimated Length of Stay: Monday, 08/23/17  Attendees: Patient: 08/23/2017 8:43 AM  Physician: Dr. Parke Poisson MD; Dr.[place] MD 08/23/2017 8:43 AM  Nursing:Doris RN; Rise Paganini RN 08/23/2017 8:43 AM  RN Care Manager: Lars Pinks CM 08/23/2017 8:43 AM  Social Worker: Maxie Better, LCSW 08/23/2017 8:43 AM  Recreational  Therapist: x 08/23/2017 8:43 AM  Other: Lindell Spar NP 08/23/2017 8:43 AM  Other:  08/23/2017 8:43 AM  Other: 08/23/2017 8:43 AM    Scribe for Treatment Team: [place], LCSW 08/23/2017 8:43 AM

## 2017-08-23 NOTE — Progress Notes (Signed)
Patient ID: Sherry [name], female   DOB: 13-Jul-1956, 62 y.o.   MRN: [id number] D: Patient reclusive to her room, and has required multiple positive verbal reinforcements to come to the medication window for meds.  Patient was encouraged to get out of bed and join the rest of her peers for group activities, but did not attend.  Pt reports a good appetite and has eaten breakfast and lunch so far.  Pt reported feeling anxious at 11:17am, states that she is not ready for discharge due to fear of relapsing, and adds that her insurance will not approve for her to go to a long term facility to maintain her sobriety.  Pt denies SI/HI/AVH, affect is blunted, mood is depressed.  A: Pt given all meds as scheduled, complained of feeling anxious at 1117am, and was medicated with Atarax 25mg  for anxiety.  Q15 minute checks maintained for safety.  R: Pt denies any current complaints, Atarax 25mg  effective, pt is currently calm/cooperative, with no signs of distress noted.  Will continue to monitor.

## 2017-08-23 NOTE — Progress Notes (Signed)
No charge note.   Discussed with Dr. Parke Poisson regarding uncontrolled HTN. Has history of HTN and no longer in withdrawal to explain transient elevation of BP. Wide pulse pressure noted.  - Would confirm any blood pressure that is >180/100 manually with correct size cuff.  - Increase HCTZ 25mg  > 50mg  (would give additional dose of 25mg  now since she got 25mg  this AM) - Start hydralazine 25mg  po q6h prn severe-range HTN as I've ordered.  - If remaining elevated and no hypotension over the next 24 hours, would start norvasc 5mg .  - Voiced concern over beta blocker in pt with history of cocaine abuse, currently cocaine negative. Once BPs better controlled, would taper down labetalol as able.  - Please call if any further assistance is needed.   Vance Gather, MD 08/23/2017 12:28 PM

## 2017-08-23 NOTE — Progress Notes (Addendum)
[facility] MD Progress Note  08/23/2017 11:32 AM Sherry [name]  MRN:  [id number]   Subjective: patient states " I am scared, anxious, I don't feel ready to discharge today".  Denies medication side effects, but reports some loose stools, which she is unsure if related to increased anxiety or to medication.   Objective:  I have discussed case with treatment team and have met with patient. She presents alert, attentive. She reports worsening depression, and anxiety, associated with palpitations and  loose stools today, in the context of discharge planning . She states " I am so afraid to leave , I am afraid I may relapse and eventually end up killing myself " ( she denies current suicidal ideations, and is able to contracts for safety on unit ). Staff reports is that patient presents anxious, depressed, cooperative, but with limited group participation. Today patient presents ruminative about medication issues, particularly Neurontin. States Neurontin has been effective for anxiety , well tolerated, but that she has taken more than prescribed in the past " trying to feel better". States " what does that say about my recovery ? "Maybe I am not as strong as I thought I was "    Principal Problem: MDD (major depressive disorder), recurrent severe, without psychosis ([place]) Diagnosis:   Patient Active Problem List   Diagnosis Date Noted  . Substance induced mood disorder ([place]) [F19.94] 08/13/2017  . AKI (acute kidney injury) ([place]) [N17.9]   . MDD (major depressive disorder), recurrent severe, without psychosis ([place]) [F33.2]   . Alcohol withdrawal ([place]) [F10.239] 08/09/2017  . Essential hypertension [I10] 07/06/2017  . Chronic kidney disease, stage III (moderate) ([place]) [N18.3] 11/17/2016  . Anxiety [F41.9] 08/21/2016  . Bipolar disorder ([place]) [F31.9] 01/31/2016  . History of breast cancer [Z85.3] 02/13/2013  . Obesity [E66.9] 02/13/2013  . Chronic hepatitis C ([place]) [B18.2] 12/17/2012   Total Time spent  with patient: 20 minutes  Past Psychiatric History: See H&P  Past Medical History:  Past Medical History:  Diagnosis Date  . Cancer ([place])   . Chronic back pain   . Depression (emotion)   . Hepatitis C   . Hypertension   . Opioid dependence ([place])    in [place]    Past Surgical History:  Procedure Laterality Date  . ABDOMINAL HYSTERECTOMY    . BACK SURGERY    . CHOLECYSTECTOMY    . MYOMECTOMY     Family History:  Family History  Family history unknown: Yes   Family Psychiatric  History: See H&P Social History:  Social History   Substance and Sexual Activity  Alcohol Use No     Social History   Substance and Sexual Activity  Drug Use No    Social History   Socioeconomic History  . Marital status: Single    Spouse name: None  . Number of children: None  . Years of education: None  . Highest education level: None  Social Needs  . Financial resource strain: None  . Food insecurity - worry: None  . Food insecurity - inability: None  . Transportation needs - medical: None  . Transportation needs - non-medical: None  Occupational History  . None  Tobacco Use  . Smoking status: Current Every Day Smoker    Packs/day: 1.00    Types: Cigarettes  . Smokeless tobacco: Never Used  Substance and Sexual Activity  . Alcohol use: No  . Drug use: No  . Sexual activity: None  Other Topics Concern  . None  Social History Narrative  . None   Additional Social History:    Sleep: Fair  Appetite:  Fair  Current Medications: Current Facility-Administered Medications  Medication Dose Route Frequency Provider Last Rate Last Dose  . alum & mag hydroxide-simeth (MAALOX/MYLANTA) 200-200-20 MG/5ML suspension 30 mL  30 mL Oral Q6H PRN Laverle Hobby, PA-C   30 mL at 08/22/17 2201  . divalproex (DEPAKOTE ER) 24 hr tablet 500 mg  500 mg Oral BH-q7a Ginni Eichler, Myer Peer, MD   500 mg at 08/23/17 1093  . gabapentin (NEURONTIN) capsule 300 mg  300 mg Oral TID Artist Beach, MD   300 mg at 08/23/17 1116  . hydrochlorothiazide (HYDRODIURIL) tablet 25 mg  25 mg Oral Daily Rankin, Shuvon B, NP   25 mg at 08/23/17 0642  . hydrOXYzine (ATARAX/VISTARIL) tablet 25 mg  25 mg Oral Q6H PRN Shawnte Winton, Myer Peer, MD   25 mg at 08/23/17 1117  . labetalol (NORMODYNE) tablet 200 mg  200 mg Oral BID Dajah Fischman, Myer Peer, MD   200 mg at 08/23/17 0849  . magnesium hydroxide (MILK OF MAGNESIA) suspension 30 mL  30 mL Oral Daily PRN Laverle Hobby, PA-C      . mirtazapine (REMERON) tablet 15 mg  15 mg Oral QHS Amaiah Cristiano, Myer Peer, MD   15 mg at 08/22/17 2126  . multivitamin with minerals tablet 1 tablet  1 tablet Oral Daily Money, Lowry Ram, FNP   1 tablet at 08/23/17 0847  . nicotine (NICODERM CQ - dosed in mg/24 hours) patch 21 mg  21 mg Transdermal Daily Izediuno, Laruth Bouchard, MD   21 mg at 08/23/17 0849  . pantoprazole (PROTONIX) EC tablet 20 mg  20 mg Oral BID Tabytha Gradillas, Myer Peer, MD   20 mg at 08/22/17 1632  . ramelteon (ROZEREM) tablet 8 mg  8 mg Oral QHS Jadien Lehigh, Myer Peer, MD   8 mg at 08/21/17 2149  . thiamine (VITAMIN B-1) tablet 100 mg  100 mg Oral Daily Money, Lowry Ram, FNP   100 mg at 08/23/17 2355    Lab Results:  No results found for this or any previous visit (from the past 48 hour(s)).  Blood Alcohol level:  Lab Results  Component Value Date   ETH <10 73/22/0254    Metabolic Disorder Labs: No results found for: HGBA1C, MPG No results found for: PROLACTIN No results found for: CHOL, TRIG, HDL, CHOLHDL, VLDL, LDLCALC  Physical Findings: AIMS: Facial and Oral Movements Muscles of Facial Expression: None, normal Lips and Perioral Area: None, normal Jaw: None, normal Tongue: None, normal,Extremity Movements Upper (arms, wrists, hands, fingers): None, normal Lower (legs, knees, ankles, toes): None, normal, Trunk Movements Neck, shoulders, hips: None, normal, Overall Severity Severity of abnormal movements (highest score from questions above): None,  normal Incapacitation due to abnormal movements: None, normal Patient's awareness of abnormal movements (rate only patient's report): No Awareness, Dental Status Current problems with teeth and/or dentures?: No Does patient usually wear dentures?: No  CIWA:  CIWA-Ar Total: 2 COWS:  COWS Total Score: 0  Musculoskeletal: Strength & Muscle Tone: within normal limits Gait & Station: normal Patient leans: N/A  Psychiatric Specialty Exam: Physical Exam  Nursing note and vitals reviewed. Constitutional: She is oriented to person, place, and time. She appears well-developed and well-nourished.  Cardiovascular: Normal rate.  Respiratory: Effort normal.  Musculoskeletal: Normal range of motion.  Neurological: She is alert and oriented to person, place, and time.  Skin: Skin is warm.  Review of Systems  HENT: Positive for sore throat.   Neurological: Positive for headaches.  Psychiatric/Behavioral: Positive for depression and suicidal ideas. The patient is nervous/anxious.   All other systems reviewed and are negative. denies headache, no chest pain, no shortness of breath, no vomiting, reports slight dizziness , which she reports has been chronic.   Blood pressure (!) 155/69, pulse 83, temperature 97.9 F (36.6 C), temperature source Oral, resp. rate 20, height 5' 2"  (1.575 m), weight 99.3 kg (219 lb), SpO2 100 %.Body mass index is 40.06 kg/m.  General Appearance: Casual  Eye Contact:  Good  Speech:  Normal Rate  Volume:  Normal  Mood:  remains depressed, anxious   Affect:  constricted, anxious, affect improves partially during session  Thought Process:  Linear and Descriptions of Associations: Intact  Orientation:  Other:  fully alert and attentive   Thought Content:  no hallucinations, no delusions, not internally preoccupied   Suicidal Thoughts:  denies suicidal plans or intention at present, but reports fearing that she may have suicidal ideations after discharge  Homicidal  Thoughts:  No  Denies homicidal ideations  Memory:  recent and remote grossly intact   Judgement:  Fair   Insight:  Fair  Psychomotor Activity:  Normal  Concentration:  Concentration: Good and Attention Span: Good  Recall:  Good  Fund of Knowledge:  Good  Language:  Good  Akathisia:  No  Handed:  Right  AIMS (if indicated):    oro-buccal symptoms have improved   Assets:  Communication Skills Desire for Improvement Financial Resources/Insurance Housing Social Support  ADL's:  Intact  Cognition:  WNL  Sleep:  Number of Hours: 5.75   Assessment - patient reports increased anxiety, increased depression in the context of disposition/discharge planning . She denies suicidal ideations at present and contracts for safety, but reports she feels she is at risk of relapsing on illicit drugs or of becoming suicidal after returning home.  Today presents anxious, ruminative.  States Neurontin has been well tolerated and partially helpful in decreasing anxiety, but worries that she has taken more than prescribed in the past , in an attempt to feel better. Reassured that Gabapentin not considered to be addictive /does not act on reward circuit, but we did review importance of taking medications as prescribed and potential risk associated with medication misuse . We did review potential increased depression as a possible side effect from Gabapentin but she states she has felt better and less anxious on this medication. Agrees to increase dose in an effort to decrease anxiety symptoms- as noted denies side effects Oro-buccal movements partially improved   * I consulted with Hospitalist for management of HTN- recommendation is to continue Labetalol at current dose ( patient reports she last used cocaine several months ago, and admission UDS negative) , increase HCTZ to 50 mgrs QDAY , and add Hydralazine PRNS for BP >180. If BP remains elevated, please reconsult to discuss adjusting regimen  further.   Treatment Plan Summary: Treatment Plan reviewed as below today 2/11  Daily contact with patient to assess and evaluate symptoms and progress in treatment, Medication management and Plan is [id number] group and milieu participation to work on coping skills and symptom reduction Continue to encourage efforts to work on sobriety and relapse prevention. Continue  Depakote ER to 500   mgrs QHS for mood disorder  Continue Remeron at 15 mgrs QHS for depression, anxiety , insomnia  Continue Rozerem 8 mgrs QHS for insomnia Continue  Labetalol  and HCTZ for management of HTN- see highlighted above  Continue  Protonix 20 mgrs QDAY for GERD. Increase Neurontin to 400 mg PO TID for chronic back pain, anxiety Check Valproic Acid Serum level in AM. Check CMP and CBC .( to monitor kidney function, hepatic function tests as on Depakote, CBC as history of chronic anemia) Treatment team working on disposition planning - she states she is interested in going to a residential /rehab setting at discharge  Jenne Campus, MD 08/23/2017, 11:32 AM   Patient ID: Sherry [name], female   DOB: 08/18/1955, 62 y.o.   MRN: [id number]

## 2017-08-23 NOTE — Progress Notes (Signed)
Recreation Therapy Notes  Date: 08/23/17 Time: 0930 Location: [address] Dayroom  Group Topic: Stress Management  Goal Area(s) Addresses:  Patient will verbalize importance of using healthy stress management.  Patient will identify positive emotions associated with healthy stress management.   Intervention: Stress Management  Activity :  Meditation.  LRT introduced the stress management technique of meditation.  LRT played a meditation that allowed patients to focus on finding forgiveness for people who may have wronged them.  Education:  Stress Management, Discharge Planning.   Education Outcome: Acknowledges edcuation/In group clarification offered/Needs additional education  Clinical Observations/Feedback: Pt did not attend group.    Victorino Sparrow, LRT/CTRS         Ria Comment, [place] Kniskern A 08/23/2017 10:48 AM

## 2017-08-23 NOTE — Progress Notes (Signed)
Pt attended evening wrap up group and stated that today was a 2, she mentioned sleeping all day due to her morning medications.

## 2017-08-24 LAB — COMPREHENSIVE METABOLIC PANEL
ALT: 12 U/L — ABNORMAL LOW (ref 14–54)
AST: 19 U/L (ref 15–41)
Albumin: 3.3 g/dL — ABNORMAL LOW (ref 3.5–5.0)
Alkaline Phosphatase: 95 U/L (ref 38–126)
Anion gap: 12 (ref 5–15)
BUN: 30 mg/dL — ABNORMAL HIGH (ref 6–20)
CO2: 22 mmol/L (ref 22–32)
Calcium: 9 mg/dL (ref 8.9–10.3)
Chloride: 108 mmol/L (ref 101–111)
Creatinine, Ser: 1.88 mg/dL — ABNORMAL HIGH (ref 0.44–1.00)
GFR calc Af Amer: 32 mL/min — ABNORMAL LOW (ref >60)
GFR calc non Af Amer: 28 mL/min — ABNORMAL LOW (ref >60)
Glucose, Bld: 129 mg/dL — ABNORMAL HIGH (ref 65–99)
Potassium: 4.4 mmol/L (ref 3.5–5.1)
Sodium: 142 mmol/L (ref 135–145)
Total Bilirubin: 0.3 mg/dL (ref 0.3–1.2)
Total Protein: 7.2 g/dL (ref 6.5–8.1)

## 2017-08-24 LAB — CBC WITH DIFFERENTIAL/PLATELET
Basophils Absolute: 0 10*3/uL (ref 0.0–0.1)
Basophils Relative: 0 %
Eosinophils Absolute: 0.2 10*3/uL (ref 0.0–0.7)
Eosinophils Relative: 2 %
HCT: 24.1 % — ABNORMAL LOW (ref 36.0–46.0)
Hemoglobin: 7.4 g/dL — ABNORMAL LOW (ref 12.0–15.0)
Lymphocytes Relative: 21 %
Lymphs Abs: 2.1 10*3/uL (ref 0.7–4.0)
MCH: 24.6 pg — ABNORMAL LOW (ref 26.0–34.0)
MCHC: 30.7 g/dL (ref 30.0–36.0)
MCV: 80.1 fL (ref 78.0–100.0)
Monocytes Absolute: 0.4 10*3/uL (ref 0.1–1.0)
Monocytes Relative: 4 %
Neutro Abs: 7 10*3/uL (ref 1.7–7.7)
Neutrophils Relative %: 73 %
Platelets: 356 10*3/uL (ref 150–400)
RBC: 3.01 MIL/uL — ABNORMAL LOW (ref 3.87–5.11)
RDW: 18.4 % — ABNORMAL HIGH (ref 11.5–15.5)
WBC: 9.7 10*3/uL (ref 4.0–10.5)

## 2017-08-24 LAB — VALPROIC ACID LEVEL: Valproic Acid Lvl: 13 ug/mL — ABNORMAL LOW (ref 50.0–100.0)

## 2017-08-24 MED ORDER — DIVALPROEX SODIUM ER 500 MG PO TB24
500.0000 mg | ORAL_TABLET | Freq: Every day | ORAL | Status: DC
  Administered 2017-08-24: 500 mg via ORAL
  Filled 2017-08-24 (×3): qty 1

## 2017-08-24 NOTE — Progress Notes (Signed)
Recreation Therapy Notes  Animal-Assisted Activity (AAA) Program Checklist/Progress Notes Patient Eligibility Criteria Checklist & Daily Group note for Rec TxIntervention  Date: 2.12.19 Time: 2:45 pm  Location: [address]   AAA/T Program Assumption of Risk Form signed by Patient/ or Parent Legal Guardian Yes  Patient is free of allergies or sever asthma Yes  Patient reports no fear of animals Yes  Patient reports no history of cruelty to animals Yes  Patient understands his/her participation is voluntary Yes  Behavioral Response: Did not attend   Ranell Patrick, Recreation Therapy Intern  Ranell Patrick 08/24/2017 8:35 AM

## 2017-08-24 NOTE — Progress Notes (Signed)
DAR NOTE: Patient presents with anxious affect and depressed mood. Pt stayed in the bed most of am stating that she was not feeling well, pt also stated she don't she is ready for discharge yet. Pt got up for lunch and has been up interacting well with both peers and staff. Pt complained of Gabapentin making her sleepy, pt also afraid that she might start abusing it since she has done that in the past.  Denies pain, auditory and visual hallucinations.  Rates depression at 4, hopelessness at 3, and anxiety at 5.  Maintained on routine safety checks.  Medications given as prescribed.  Support and encouragement offered as needed.  Attended group and participated.    Patient observed socializing with peers in the dayroom.  Offered no complaint.

## 2017-08-24 NOTE — [facility] Group Notes (Addendum)
[facility] Group Notes:  (Nursing/MHT/Case Management/Adjunct)  Date:  08/24/2017  Time:  3:30  p.m.  Type of Therapy:  Psychoeducational Skills  Participation Level:  Did Not Attend  Participation Quality:    Affect:    Cognitive:    Insight:    Engagement in Group:    Modes of Intervention:    Summary of Progress/Problems:  Cammy Copa 08/24/2017, 5:39 PM

## 2017-08-24 NOTE — [facility] Group Notes (Signed)
[facility] Group Therapy 08/24/2017 1:15pm  Type of Therapy: Mental Health Association Presentation  Participation Level: Active  Participation Quality: Attentive  Affect: Appropriate  Cognitive: Oriented  Insight: Developing/Improving  Engagement in Therapy: Engaged  Modes of Intervention: Discussion, Education and Socialization  Summary of Progress/Problems: [place] Speaker came to talk about his personal journey with mental health. The pt processed ways by which to relate to the speaker. [place] speaker provided handouts and educational information pertaining to groups and services offered by the [facility]. Pt was engaged in speaker's presentation and was receptive to resources provided.    [employer], LCSW 08/24/2017 3:09 PM

## 2017-08-24 NOTE — Plan of Care (Signed)
  Progressing Medication: Compliance with prescribed medication regimen will improve 08/24/2017 0249 - Progressing by Karie Kirks, RN Note Pt has been compliant with medications tonight.

## 2017-08-24 NOTE — Progress Notes (Signed)
D: Pt was in the hallway upon initial approach.  Pt presents with depressed affect and mood.  When asked about her day, pt states "at least I was awake today, getting excited about leaving tomorrow."  She reports she is "afraid of gabapentin, that I'll relapse on that, I used to abuse it."  She plans to do intensive outpatient treatment after discharge.  Pt denies SI/HI, denies hallucinations, denies pain.  Pt has been visible in milieu interacting with peers and staff appropriately.  Pt attended evening group.    A: Introduced self to pt.  Actively listened to pt and offered support and encouragement. Medications administered per order.  PRN medication administered for anxiety per request.  Q15 minute safety checks maintained.  R: Pt is safe on the unit.  Pt is compliant with medications.  Pt verbally contracts for safety.  Will continue to monitor and assess.

## 2017-08-24 NOTE — [facility] Group Notes (Signed)
Adult Psychoeducational Group Note  Date:  08/24/2017 Time:  7:05 PM  Group Topic/Focus:  Activity  Participation Level:  Active   Additional Comments:  Pt participated in [facility] activity.    Levora Dredge 08/24/2017, 7:05 PM

## 2017-08-24 NOTE — Progress Notes (Signed)
[facility] MD Progress Note  08/24/2017 12:37 PM Sherry [name]  MRN:  [id number]   Subjective:  Patient reports that she doing good, but feels "panicy about discharging." She denies any medication side effects. She is still worried about taking Gabapentin, even though she has been educated numerous times. She asks for the Depakote to be moved back to bedtime because she feels groggy after taking it. She denies any SI/HI/AVH and contracts for safety.    Objective: Patient's chart and findings reviewed and discussed with treatment team. Patient is informed that she will be discharging tomorrow. Patient presents in the milieu pleasant and cooperative. She has been seen in the day room interacting with peers and staff appropriately. Patient seems to be sabotaging her discharge due to her fear of going home, but has support from family and housing. She has been arranged to follow up at IOP as well.  Principal Problem: MDD (major depressive disorder), recurrent severe, without psychosis ([place]) Diagnosis:   Patient Active Problem List   Diagnosis Date Noted  . Substance induced mood disorder ([place]) [F19.94] 08/13/2017  . AKI (acute kidney injury) ([place]) [N17.9]   . MDD (major depressive disorder), recurrent severe, without psychosis ([place]) [F33.2]   . Alcohol withdrawal ([place]) [F10.239] 08/09/2017  . Essential hypertension [I10] 07/06/2017  . Chronic kidney disease, stage III (moderate) ([place]) [N18.3] 11/17/2016  . Anxiety [F41.9] 08/21/2016  . Bipolar disorder ([place]) [F31.9] 01/31/2016  . History of breast cancer [Z85.3] 02/13/2013  . Obesity [E66.9] 02/13/2013  . Chronic hepatitis C ([place]) [B18.2] 12/17/2012   Total Time spent with patient: 15 minutes  Past Psychiatric History: See H&P  Past Medical History:  Past Medical History:  Diagnosis Date  . Cancer ([place])   . Chronic back pain   . Depression (emotion)   . Hepatitis C   . Hypertension   . Opioid dependence ([place])    in [place]    Past  Surgical History:  Procedure Laterality Date  . ABDOMINAL HYSTERECTOMY    . BACK SURGERY    . CHOLECYSTECTOMY    . MYOMECTOMY     Family History:  Family History  Family history unknown: Yes   Family Psychiatric  History: See H&P Social History:  Social History   Substance and Sexual Activity  Alcohol Use No     Social History   Substance and Sexual Activity  Drug Use No    Social History   Socioeconomic History  . Marital status: Single    Spouse name: None  . Number of children: None  . Years of education: None  . Highest education level: None  Social Needs  . Financial resource strain: None  . Food insecurity - worry: None  . Food insecurity - inability: None  . Transportation needs - medical: None  . Transportation needs - non-medical: None  Occupational History  . None  Tobacco Use  . Smoking status: Current Every Day Smoker    Packs/day: 1.00    Types: Cigarettes  . Smokeless tobacco: Never Used  Substance and Sexual Activity  . Alcohol use: No  . Drug use: No  . Sexual activity: None  Other Topics Concern  . None  Social History Narrative  . None   Additional Social History:                         Sleep: Good  Appetite:  Good  Current Medications: Current Facility-Administered Medications  Medication Dose Route Frequency Provider  Last Rate Last Dose  . alum & mag hydroxide-simeth (MAALOX/MYLANTA) 200-200-20 MG/5ML suspension 30 mL  30 mL Oral Q6H PRN Laverle Hobby, PA-C   30 mL at 08/23/17 2145  . divalproex (DEPAKOTE ER) 24 hr tablet 500 mg  500 mg Oral QHS Nick Armel, Lowry Ram, FNP      . ferrous sulfate tablet 325 mg  325 mg Oral BID WC Patrecia Pour, MD   325 mg at 08/24/17 2947  . gabapentin (NEURONTIN) capsule 400 mg  400 mg Oral TID Cobos, Myer Peer, MD   400 mg at 08/24/17 1142  . hydrALAZINE (APRESOLINE) tablet 25 mg  25 mg Oral Q6H PRN Patrecia Pour, MD   25 mg at 08/24/17 6546  . hydrochlorothiazide (HYDRODIURIL) tablet  50 mg  50 mg Oral Daily Cobos, Myer Peer, MD   50 mg at 08/24/17 5035  . hydrOXYzine (ATARAX/VISTARIL) tablet 25 mg  25 mg Oral Q6H PRN Cobos, Myer Peer, MD   25 mg at 08/23/17 1848  . labetalol (NORMODYNE) tablet 200 mg  200 mg Oral BID Cobos, Myer Peer, MD   200 mg at 08/24/17 0837  . magnesium hydroxide (MILK OF MAGNESIA) suspension 30 mL  30 mL Oral Daily PRN Laverle Hobby, PA-C      . mirtazapine (REMERON) tablet 15 mg  15 mg Oral QHS Cobos, Myer Peer, MD   15 mg at 08/23/17 2112  . multivitamin with minerals tablet 1 tablet  1 tablet Oral Daily Amarachukwu Lakatos, Lowry Ram, FNP   1 tablet at 08/24/17 [phone]  . nicotine (NICODERM CQ - dosed in mg/24 hours) patch 21 mg  21 mg Transdermal Daily Izediuno, Laruth Bouchard, MD   21 mg at 08/24/17 0839  . pantoprazole (PROTONIX) EC tablet 20 mg  20 mg Oral BID Cobos, Myer Peer, MD   20 mg at 08/24/17 0837  . polyethylene glycol (MIRALAX / GLYCOLAX) packet 17 g  17 g Oral Daily Vance Gather B, MD      . ramelteon (ROZEREM) tablet 8 mg  8 mg Oral QHS Cobos, Myer Peer, MD   8 mg at 08/23/17 2112  . thiamine (VITAMIN B-1) tablet 100 mg  100 mg Oral Daily Fallen Crisostomo, Lowry Ram, FNP   100 mg at 08/24/17 8127    Lab Results: No results found for this or any previous visit (from the past 48 hour(s)).  Blood Alcohol level:  Lab Results  Component Value Date   ETH <10 51/70/0174    Metabolic Disorder Labs: No results found for: HGBA1C, MPG No results found for: PROLACTIN No results found for: CHOL, TRIG, HDL, CHOLHDL, VLDL, LDLCALC  Physical Findings: AIMS: Facial and Oral Movements Muscles of Facial Expression: None, normal Lips and Perioral Area: None, normal Jaw: None, normal Tongue: None, normal,Extremity Movements Upper (arms, wrists, hands, fingers): None, normal Lower (legs, knees, ankles, toes): None, normal, Trunk Movements Neck, shoulders, hips: None, normal, Overall Severity Severity of abnormal movements (highest score from questions above): None,  normal Incapacitation due to abnormal movements: None, normal Patient's awareness of abnormal movements (rate only patient's report): No Awareness, Dental Status Current problems with teeth and/or dentures?: No Does patient usually wear dentures?: No  CIWA:  CIWA-Ar Total: 2 COWS:  COWS Total Score: 0  Musculoskeletal: Strength & Muscle Tone: within normal limits Gait & Station: normal Patient leans: N/A  Psychiatric Specialty Exam: Physical Exam  Nursing note and vitals reviewed. Constitutional: She is oriented to person, place, and time. She appears  well-developed and well-nourished.  Cardiovascular: Normal rate.  Respiratory: Effort normal.  Musculoskeletal: Normal range of motion.  Neurological: She is alert and oriented to person, place, and time.  Skin: Skin is warm.    Review of Systems  Constitutional: Negative.   HENT: Negative.   Eyes: Negative.   Respiratory: Negative.   Cardiovascular: Negative.   Gastrointestinal: Negative.   Genitourinary: Negative.   Musculoskeletal: Positive for myalgias.  Skin: Negative.   Neurological: Negative.   Endo/Heme/Allergies: Negative.   Psychiatric/Behavioral: Negative for depression, hallucinations and suicidal ideas. The patient is nervous/anxious.     Blood pressure (!) 174/89, pulse 68, temperature 97.8 F (36.6 C), resp. rate 16, height 5\' 2"  (1.575 m), weight 99.3 kg (219 lb), SpO2 100 %.Body mass index is 40.06 kg/m.  General Appearance: Casual  Eye Contact:  Good  Speech:  Clear and Coherent and Normal Rate  Volume:  Normal  Mood:  Anxious  Affect:  Congruent  Thought Process:  Goal Directed and Descriptions of Associations: Intact  Orientation:  Full (Time, Place, and Person)  Thought Content:  WDL  Suicidal Thoughts:  No  Homicidal Thoughts:  No  Memory:  Immediate;   Good Recent;   Good Remote;   Good  Judgement:  Good  Insight:  Good  Psychomotor Activity:  Normal  Concentration:  Concentration: Good and  Attention Span: Good  Recall:  Good  Fund of Knowledge:  Good  Language:  Good  Akathisia:  No  Handed:  Right  AIMS (if indicated):     Assets:  Communication Skills Desire for Improvement Financial Resources/Insurance Housing Physical Health Social Support Transportation  ADL's:  Intact  Cognition:  WNL  Sleep:  Number of Hours: 6.25   Problems Addressed: MDD severe  Treatment Plan Summary: Daily contact with patient to assess and evaluate symptoms and progress in treatment, Medication management and Plan is to:  -Change Depakote ER 500 mg PO QHS for mood stability -Continue Gabapentin 400 mg PO TID for neuropathic pain -Continue Vistaril 25 mg PO Q6H PRN for anxiety -Continue Remeron 15 mg PO QHS for mood stability -Continue Rozerem 8 mg PO QHS for insomnia -Encourage group therapy participation -Discharge tomorrow  Lewis Shock, FNP 08/24/2017, 12:37 PM

## 2017-08-25 MED ORDER — LABETALOL HCL 200 MG PO TABS: 200.0000 mg | ORAL_TABLET | Freq: Two times a day (BID) | ORAL | 0 refills | Status: DC

## 2017-08-25 MED ORDER — HYDROCHLOROTHIAZIDE 50 MG PO TABS: 50.0000 mg | ORAL_TABLET | Freq: Every day | ORAL | 0 refills | Status: DC

## 2017-08-25 MED ORDER — RAMELTEON 8 MG PO TABS: 8.0000 mg | ORAL_TABLET | Freq: Every day | ORAL | 0 refills | Status: DC

## 2017-08-25 MED ORDER — HYDROXYZINE HCL 25 MG PO TABS: 25.0000 mg | ORAL_TABLET | Freq: Four times a day (QID) | ORAL | 0 refills | Status: DC | PRN

## 2017-08-25 MED ORDER — GABAPENTIN 400 MG PO CAPS: 400.0000 mg | ORAL_CAPSULE | Freq: Three times a day (TID) | ORAL | 0 refills | Status: DC

## 2017-08-25 MED ORDER — FERROUS SULFATE 325 (65 FE) MG PO TABS: 325.0000 mg | ORAL_TABLET | Freq: Two times a day (BID) | ORAL | 0 refills | Status: DC

## 2017-08-25 MED ORDER — MIRTAZAPINE 15 MG PO TABS: 15.0000 mg | ORAL_TABLET | Freq: Every day | ORAL | 0 refills | Status: DC

## 2017-08-25 MED ORDER — PANTOPRAZOLE SODIUM 20 MG PO TBEC: 20.0000 mg | DELAYED_RELEASE_TABLET | Freq: Two times a day (BID) | ORAL | 0 refills | Status: DC

## 2017-08-25 MED ORDER — DIVALPROEX SODIUM ER 500 MG PO TB24: 500.0000 mg | ORAL_TABLET | Freq: Every day | ORAL | 0 refills | Status: DC

## 2017-08-25 NOTE — Progress Notes (Signed)
Patient verbalizes anxiety and worry regarding discharge but states, "I have to face my fears. I can't hide out. I felt okay about it yesterday but today I'm so antsy." Reassured patient these emotions are normal and expected. Discussed coping skills, need for support both from family and friends but also from her sponsor. She states these people are in place as a good and healthy network for her.  Follow up plan explained, AVS, transition record and SRA given along with prescriptions. No belongings to return. Suicide Safety Plan completion refused along with self inventory. (Patient slept good bit of morning.) Patient verbalizes understanding. Denies SI/HI and assures this Probation officer she will seek assistance should that change. Patient discharged ambulatory and in stable condition to sister.

## 2017-08-25 NOTE — Progress Notes (Signed)
Recreation Therapy Notes  Date: 08/25/17 Time: 0930 Location: 300 Hall Dayroom  Group Topic: Stress Management  Goal Area(s) Addresses:  Patient will verbalize importance of using healthy stress management.  Patient will identify positive emotions associated with healthy stress management.   Behavioral Response: Engaged  Intervention: Stress Management  Activity :  LRT introduced the stress management technique of guided imagery.  LRT read a script to guide patients to envision their peaceful place. Patients were to follow along as LRT read script.  Education:  Stress Management, Discharge Planning.   Education Outcome: Acknowledges edcuation/In group clarification offered/Needs additional education  Clinical Observations/Feedback: Pt attended group.    Victorino Sparrow, LRT/CTRS         Ria Comment, Tej Murdaugh A 08/25/2017 11:25 AM

## 2017-08-25 NOTE — Progress Notes (Addendum)
  [facility] Adult Case Management Discharge Plan :  Will you be returning to the same living situation after discharge:  Yes.  At discharge, do you have transportation home?: Yes,  sister coming at 12pm Do you have the ability to pay for your medications: Yes,  [employer] medicare and [facility] Medicaid  Release of information consent forms completed and submitted to medical records by CSW.  Patient to Follow up at: Follow-up Information    [facility] Follow up on 08/27/2017.   Specialty:  Behavioral Health Why:  Assessment for CDIOP with Brandon Melnick on Friday at 10:45AM-1:00PM. Please bring insurance card and completed orientation packet to this appt. Thank you.  Contact information: [place] [phone]          Next level of care provider has access to [place] and Suicide Prevention discussed: Yes,  SPE completed with pt; pt declined to consent to collateral contact.   Have you used any form of tobacco in the last 30 days? (Cigarettes, Smokeless Tobacco, Cigars, and/or Pipes): Yes  Has patient been referred to the Quitline?: Patient refused referral  Patient has been referred for addiction treatment: Yes  [employer], LCSW 08/25/2017, 11:28 AM

## 2017-08-25 NOTE — Progress Notes (Addendum)
D: Patient observed resting in bed however did come up for AM meds. Reports some drowsiness but reports adequate sleep. Patient's affect affect flat, mood slightly depressed however patient pleasant. States she feels ready for discharge and sister will be here at 1200. P  States goal for today is to discharge. Denies pain, physical complaints . DASA completed and is a "0" with no verbal or physical threats or aggression.  A: Medicated per orders, no prns requested or required. Level III obs in place for safety. Emotional support offered. Encouraged completion of self inventory, Suicide Safety Plan and programming participation. Discussed POC with MD, SW.  Fall prevention plan in place and reviewed with patient as pt is a moderate fall risk per scoring tool.   R: Patient verbalizes understanding of POC, falls prevention education. Patient denies SI/HI/AVH and remains safe on level III obs. Will continue to monitor closely and make verbal contact frequently. Awaiting discharge.

## 2017-08-25 NOTE — BHH Suicide Risk Assessment (Signed)
[facility] Discharge Suicide Risk Assessment   Principal Problem: MDD (major depressive disorder), recurrent severe, without psychosis ([place]) Discharge Diagnoses: Substance Induced Mood Disorder Patient Active Problem List   Diagnosis Date Noted  . Substance induced mood disorder ([place]) [F19.94] 08/13/2017  . AKI (acute kidney injury) ([place]) [N17.9]   . MDD (major depressive disorder), recurrent severe, without psychosis ([place]) [F33.2]   . Alcohol withdrawal ([place]) [F10.239] 08/09/2017  . Essential hypertension [I10] 07/06/2017  . Chronic kidney disease, stage III (moderate) ([place]) [N18.3] 11/17/2016  . Anxiety [F41.9] 08/21/2016  . Bipolar disorder ([place]) [F31.9] 01/31/2016  . History of breast cancer [Z85.3] 02/13/2013  . Obesity [E66.9] 02/13/2013  . Chronic hepatitis C ([place]) [B18.2] 12/17/2012    Total Time spent with patient: 45 minutes  Musculoskeletal: Strength & Muscle Tone: within normal limits Gait & Station: normal Patient leans: N/A  Psychiatric Specialty Exam: Review of Systems  Constitutional: Negative.   HENT: Negative.   Eyes: Negative.   Respiratory: Negative.   Cardiovascular: Negative.   Gastrointestinal: Negative.   Genitourinary: Negative.   Musculoskeletal: Negative.   Skin: Negative.   Neurological: Negative.   Endo/Heme/Allergies: Negative.   Psychiatric/Behavioral: Negative for depression, hallucinations, memory loss, substance abuse and suicidal ideas. The patient is not nervous/anxious and does not have insomnia.     Blood pressure (!) 154/88, pulse 75, temperature 97.8 F (36.6 C), temperature source Oral, resp. rate 16, height 5\' 2"  (1.575 m), weight 99.3 kg (219 lb), SpO2 100 %.Body mass index is 40.06 kg/m.  General Appearance: Casually dressed. Calm and cooperative. Good relatedness. Appropriate behavior.  Eye Contact::  Good  Speech:  Clear and Coherent and Normal Rate  Volume:  Normal  Mood:  Euthymic  Affect:  Appropriate and Full Range  Thought  Process:  Linear  Orientation:  Full (Time, Place, and Person)  Thought Content:  Worries she might relapse. No delusional theme. No preoccupation with violent thoughts. No negative ruminations. No obsession.  No hallucination in any modality.   Suicidal Thoughts:  No  Homicidal Thoughts:  No  Memory:  Immediate;   Good Recent;   Good Remote;   Good  Judgement:  Good  Insight:  Good  Psychomotor Activity:  Normal  Concentration:  Good  Recall:  Good  Fund of Knowledge:Good  Language: Good  Akathisia:  Negative  Handed:    AIMS (if indicated):     Assets:  Agricultural consultant Housing Resilience  Sleep:  Number of Hours: 6  Cognition: WNL  ADL's:  Intact   Clinical Assessment::   62 y.o AAF single, lives alone, no kids on SSI. Background history of SUD and mood disorder. Transferred from the medical floor where she was managed for acute on chronic kidney disease, alcohol and benzodiazepine withdrawals. Patient is reported to have been using substances on a daily basis. She has multiple medical issues that is exacerbated by use of substances. She has had multiple ER visits lately. Patient has been expressing suicidal thoughts.  Patient reports being stressed by medical issues and financial constraints. She tells me that use of substances has caused her a lot health issues. Says she has to quit or she would kill herself in the process.  Seen today. Realistic worries that she might relapse again. Aware of effects of psychoactive drugs on her medical issues.  Reports that she is in good spirits. Not feeling depressed. Reports normal energy and interest. She has been maintaining normal biological functions. She is able to think clearly.  She is able to focus on task. Her thoughts are not crowded or racing. No evidence of mania. No hallucination in any modality. She is not making any delusional statement. No passivity of will/thought. She is fully in touch with  reality. No thoughts of suicide. No thoughts of homicide. No violent thoughts. No overwhelming anxiety. No access to weapons.   Nursing staff reports that patient has been appropriate on the unit. Patient has been interacting well with peers. No behavioral issues. Patient has not voiced any suicidal thoughts. Patient has not been observed to be internally stimulated. Patient has been adherent with treatment recommendations. Patient has been tolerating their medication well.   Patient was discussed at team. Team members feels that patient is back to her baseline level of function. Team agrees with plan to discharge patient today.  Demographic Factors:  Low socioeconomic status and Living alone  Loss Factors: Decline in physical health and Financial problems/change in socioeconomic status  Historical Factors: Impulsivity  Risk Reduction Factors:   Sense of responsibility to family, Positive social support, Positive therapeutic relationship and Positive coping skills or problem solving skills  Continued Clinical Symptoms:  As above   Cognitive Features That Contribute To Risk:  None    Suicide Risk:  Minimal: No identifiable suicidal ideation.  Patient is not having any thoughts of suicide at this time. Modifiable risk factors targeted during this admission includes depression and substance use. Demographical and historical risk factors cannot be modified. Patient is now engaging well. Patient is reliable and is future oriented. We have buffered patient's support structures. At this point, patient is at low risk of suicide. Patient is aware of the effects of psychoactive substances on decision making process. Patient has been provided with emergency contacts. Patient acknowledges to use resources provided if unforseen circumstances changes their current risk stratification.    Follow-up Information    [facility] Follow up on 08/26/2017.   Specialty:   Behavioral Health Why:  Assessment for CDIOP with Brandon Melnick on Friday at 10:45AM-1:00PM. Please bring insurance card and completed orientation packet to this appt. Thank you.  Contact information: [place] [phone]          Plan Of Care/Follow-up recommendations:  1. Continue current psychotropic medications 2. Mental health and addiction follow up as arranged.  3. Provided limited quantity of prescriptions   [place], MD 08/25/2017, 10:28 AM

## 2017-08-25 NOTE — Discharge Summary (Signed)
Physician Discharge Summary Note  Patient:  Sherry [name] is an 62 y.o., female MRN:  [id number] DOB:  10/09/1955 Patient phone:  [phone] (home)  Patient address:   [address] [place] [address],  Total Time spent with patient: 20 minutes  Date of Admission:  08/12/2017 Date of Discharge: 08/25/17   Reason for Admission:  Worsening depression with SI and SUD  Principal Problem: MDD (major depressive disorder), recurrent severe, without psychosis [facility]) Discharge Diagnoses: Patient Active Problem List   Diagnosis Date Noted  . Substance induced mood disorder ([place]) [F19.94] 08/13/2017  . AKI (acute kidney injury) ([place]) [N17.9]   . MDD (major depressive disorder), recurrent severe, without psychosis ([place]) [F33.2]   . Alcohol withdrawal ([place]) [F10.239] 08/09/2017  . Essential hypertension [I10] 07/06/2017  . Chronic kidney disease, stage III (moderate) ([place]) [N18.3] 11/17/2016  . Anxiety [F41.9] 08/21/2016  . Bipolar disorder ([place]) [F31.9] 01/31/2016  . History of breast cancer [Z85.3] 02/13/2013  . Obesity [E66.9] 02/13/2013  . Chronic hepatitis C ([place]) [B18.2] 12/17/2012    Past Psychiatric History: Suicide attempt more than 30 years ago, Depression, Bipolar,   Past Medical History:  Past Medical History:  Diagnosis Date  . Cancer ([place])   . Chronic back pain   . Depression (emotion)   . Hepatitis C   . Hypertension   . Opioid dependence ([place])    in [place]    Past Surgical History:  Procedure Laterality Date  . ABDOMINAL HYSTERECTOMY    . BACK SURGERY    . CHOLECYSTECTOMY    . MYOMECTOMY     Family History:  Family History  Family history unknown: Yes   Family Psychiatric  History: Denies Social History:  Social History   Substance and Sexual Activity  Alcohol Use No     Social History   Substance and Sexual Activity  Drug Use No    Social History   Socioeconomic History  . Marital status: Single    Spouse name: None  . Number of  children: None  . Years of education: None  . Highest education level: None  Social Needs  . Financial resource strain: None  . Food insecurity - worry: None  . Food insecurity - inability: None  . Transportation needs - medical: None  . Transportation needs - non-medical: None  Occupational History  . None  Tobacco Use  . Smoking status: Current Every Day Smoker    Packs/day: 1.00    Types: Cigarettes  . Smokeless tobacco: Never Used  Substance and Sexual Activity  . Alcohol use: No  . Drug use: No  . Sexual activity: None  Other Topics Concern  . None  Social History Narrative  . None    Hospital Course:   08/10/17 Psychiatric Consult: Per chart review, patient has a history of bipolar disorder, anxiety and alcohol abuse. She was admitted with alcohol withdrawal and endorsed SI with plan on admission. She has a history of suicide attempt. She was assessed by TTS on 1/28. She reported SI with a plan to cut her wrist. She reports abusing pain pills daily with alcohol use. She uses Vicodin and Morphine (10-15 pills daily). She reports [facility] telling her to end her life and giving her instructions to cut her wrist. She is prescribed Prozac 20 mg daily. PMP indicates a prescription for Lunesta 2 mg (#60) last filled on 12/19. She has several pain medications and benzodiazepines prescriptions for a short supply filled from various providers. UDS was negative on  admission. On interview, Ms. Petitjean reports SI with negative thoughts. She has thoughts to cut her wrist. She last cut 30 years ago and required sutures and psychiatric hospitalization. She also reports CAH that tell her to harm self. She reports several stressors including financial stressors. She reports onset of depression a year ago so she was started on Prozac 20 mg daily. Her depression improved until a month ago. She reports feelings of hopelessness and helplessness. She endorses poor sleep and poor appetite. Her nurse later  reported that she has been sleeping most of the day. She also reports frequent worries. She denies HI and reports feeling safe in the hospital. She denies a history of manic symptoms (decreased need for sleep, increased energy and pressured speech). She reports heavy alcohol use for years. She has only been sober for up to a few days and has never received treatment for alcohol abuse.  08/13/17 [place] MD Assessment: Patient is seen today and confirms the above information. She also adds that she she is still suicidal but no plan,. She reports that she has a place to live off of [employer]. She is requesting residential treatment, but has private insurance. CSW will notify patient of options. Patient continues to state withdrawal symptoms, but she dose not present with any since on the unit and she was at the hospital for the last 3 days. Patient is continued on Prozac 40 mg Daily and the Librium Detox was discontinued except for Librium PRN for CIWA greater than 10.  Patient remained on the [facility] unit for 12 days and has stabilized with medication and therapy. Patient was started on started on Prozac and titrated to 60 mg Daily, Thorazine 50 mg QHS, Trazodone 100 mg QHS, and Remeron 15 mg QHS. Patient started showing significant signs of Tardive Dyskinesia, so the Thorazine, Prozac, and Trazodone were discontinued. Patient was then started on Depakote R 500 mg QHS, Remeron 15 mg QHS, and Gabapentin 400 mg TID. Patient's HTN was monitored and was managed through a hospitalist consult and labetalol was increase to 200 mg BID, Apresoline 25 mg Q6H PRN for elevated BP. Patient  Showed improvement with improved mood, affect, sleep, appetite, and interaction. Patient was seen in the day room interacting with peers and staff appropriately. Patient attended groups and participated. Patient denies any SI/HI/AVH and contracts for safety. Patient agrees to follow up at [place]. Patient is  provided with prescriptions for her medications upon discharge.   Physical Findings: AIMS: Facial and Oral Movements Muscles of Facial Expression: None, normal Lips and Perioral Area: None, normal Jaw: None, normal Tongue: None, normal,Extremity Movements Upper (arms, wrists, hands, fingers): None, normal Lower (legs, knees, ankles, toes): None, normal, Trunk Movements Neck, shoulders, hips: None, normal, Overall Severity Severity of abnormal movements (highest score from questions above): None, normal Incapacitation due to abnormal movements: None, normal Patient's awareness of abnormal movements (rate only patient's report): No Awareness, Dental Status Current problems with teeth and/or dentures?: No Does patient usually wear dentures?: No  CIWA:  CIWA-Ar Total: 2 COWS:  COWS Total Score: 0  Musculoskeletal: Strength & Muscle Tone: within normal limits Gait & Station: normal Patient leans: N/A  Psychiatric Specialty Exam: Physical Exam  Nursing note and vitals reviewed. Constitutional: She is oriented to person, place, and time. She appears well-developed and well-nourished.  Cardiovascular: Normal rate.  Respiratory: Effort normal.  Musculoskeletal: Normal range of motion.  Neurological: She is alert and oriented to person, place, and time.  Skin: Skin is warm.    Review of Systems  Constitutional: Negative.   HENT: Negative.   Eyes: Negative.   Respiratory: Negative.   Cardiovascular: Negative.   Gastrointestinal: Negative.   Genitourinary: Negative.   Musculoskeletal: Negative.   Skin: Negative.   Neurological: Negative.   Endo/Heme/Allergies: Negative.   Psychiatric/Behavioral: The patient is nervous/anxious.     Blood pressure (!) 154/88, pulse 75, temperature 97.8 F (36.6 C), temperature source Oral, resp. rate 16, height 5\' 2"  (1.575 m), weight 99.3 kg (219 lb), SpO2 100 %.Body mass index is 40.06 kg/m.  General Appearance: Casual and Fairly Groomed  Eye  Contact:  Good  Speech:  Clear and Coherent and Normal Rate  Volume:  Normal  Mood:  Anxious  Affect:  Congruent  Thought Process:  Goal Directed and Descriptions of Associations: Intact  Orientation:  Full (Time, Place, and Person)  Thought Content:  WDL  Suicidal Thoughts:  No  Homicidal Thoughts:  No  Memory:  Immediate;   Good Recent;   Good Remote;   Good  Judgement:  Good  Insight:  Good  Psychomotor Activity:  Normal  Concentration:  Concentration: Good and Attention Span: Good  Recall:  Good  Fund of Knowledge:  Good  Language:  Good  Akathisia:  No  Handed:  Right  AIMS (if indicated):     Assets:  Communication Skills Desire for Improvement Financial Resources/Insurance Housing Social Support Transportation  ADL's:  Intact  Cognition:  WNL  Sleep:  Number of Hours: 6     Have you used any form of tobacco in the last 30 days? (Cigarettes, Smokeless Tobacco, Cigars, and/or Pipes): Yes  Has this patient used any form of tobacco in the last 30 days? (Cigarettes, Smokeless Tobacco, Cigars, and/or Pipes) Yes, Yes, A prescription for an FDA-approved tobacco cessation medication was offered at discharge and the patient refused  Blood Alcohol level:  Lab Results  Component Value Date   ETH <10 19/41/7408    Metabolic Disorder Labs:  No results found for: HGBA1C, MPG No results found for: PROLACTIN No results found for: CHOL, TRIG, HDL, CHOLHDL, VLDL, LDLCALC  See Psychiatric Specialty Exam and Suicide Risk Assessment completed by Attending Physician prior to discharge.  Discharge destination:  Home  Is patient on multiple antipsychotic therapies at discharge:  No   Has Patient had three or more failed trials of antipsychotic monotherapy by history:  No  Recommended Plan for Multiple Antipsychotic Therapies: NA   Allergies as of 08/25/2017      Reactions   Flexeril [cyclobenzaprine]    Pt states Flexeril makes her feel depressed    Amoxicillin Rash       Medication List    STOP taking these medications   FLUoxetine 20 MG capsule Commonly known as:  PROZAC   folic acid 1 MG tablet Commonly known as:  FOLVITE   sucralfate 1 g tablet Commonly known as:  CARAFATE   thiamine 100 MG tablet   zolpidem 10 MG tablet Commonly known as:  AMBIEN     TAKE these medications     Indication  divalproex 500 MG 24 hr tablet Commonly known as:  DEPAKOTE ER Take 1 tablet (500 mg total) by mouth at bedtime. For mood control  Indication:  mood stability   ferrous sulfate 325 (65 FE) MG tablet Take 1 tablet (325 mg total) by mouth 2 (two) times daily with a meal.  Indication:  Iron Deficiency   gabapentin 400 MG capsule Commonly  known as:  NEURONTIN Take 1 capsule (400 mg total) by mouth 3 (three) times daily. For pain and withdrawal symptoms  Indication:  Alcohol Withdrawal Syndrome, Neuropathic Pain   hydrochlorothiazide 50 MG tablet Commonly known as:  HYDRODIURIL Take 1 tablet (50 mg total) by mouth daily. For high blood pressure What changed:    medication strength  how much to take  additional instructions  Indication:  High Blood Pressure Disorder   hydrOXYzine 25 MG tablet Commonly known as:  ATARAX/VISTARIL Take 1 tablet (25 mg total) by mouth every 6 (six) hours as needed for anxiety.  Indication:  Feeling Anxious   labetalol 200 MG tablet Commonly known as:  NORMODYNE Take 1 tablet (200 mg total) by mouth 2 (two) times daily. For high blood pressure What changed:    medication strength  how much to take  additional instructions  Indication:  High Blood Pressure Disorder   mirtazapine 15 MG tablet Commonly known as:  REMERON Take 1 tablet (15 mg total) by mouth at bedtime. For mood control  Indication:  mood stability   pantoprazole 20 MG tablet Commonly known as:  PROTONIX Take 1 tablet (20 mg total) by mouth 2 (two) times daily. What changed:  when to take this  Indication:  Gastroesophageal Reflux  Disease   ramelteon 8 MG tablet Commonly known as:  ROZEREM Take 1 tablet (8 mg total) by mouth at bedtime. For sleep  Indication:  [facility] Follow up on 08/26/2017.   Specialty:  Behavioral Health Why:  Assessment for CDIOP with Brandon Melnick on Friday at 10:45AM-1:00PM. Please bring insurance card and completed orientation packet to this appt. Thank you.  Contact information: [place] [phone]          Follow-up recommendations:  Continue activity as tolerated. Continue diet as recommended by your PCP. Ensure to keep all appointments with outpatient providers.  Comments:  Patient is instructed prior to discharge to: Take all medications as prescribed by his/her mental healthcare provider. Report any adverse effects and or reactions from the medicines to his/her outpatient provider promptly. Patient has been instructed & cautioned: To not engage in alcohol and or illegal drug use while on prescription medicines. In the event of worsening symptoms, patient is instructed to call the crisis hotline, 911 and or go to the nearest ED for appropriate evaluation and treatment of symptoms. To follow-up with his/her primary care provider for your other medical issues, concerns and or health care needs.    Signed: [place], FNP 08/25/2017, 8:46 AM

## 2017-08-27 ENCOUNTER — Encounter (HOSPITAL_COMMUNITY): Payer: Self-pay | Admitting: Psychology

## 2017-08-27 ENCOUNTER — Ambulatory Visit (INDEPENDENT_AMBULATORY_CARE_PROVIDER_SITE_OTHER): Payer: Medicare HMO | Admitting: Psychology

## 2017-08-27 DIAGNOSIS — F1020 Alcohol dependence, uncomplicated: Secondary | ICD-10-CM

## 2017-08-27 DIAGNOSIS — F1120 Opioid dependence, uncomplicated: Secondary | ICD-10-CM

## 2017-08-27 HISTORY — DX: Alcohol dependence, uncomplicated: F10.20

## 2017-08-27 HISTORY — DX: Opioid dependence, uncomplicated: F11.20
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
## 2017-08-27 NOTE — Addendum Note (Signed)
Addended byAmalia Hailey, Nicola Quesnell on: 08/27/2017 02:31 PM   Modules accepted: Level of Service
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
## 2017-08-30 ENCOUNTER — Other Ambulatory Visit (HOSPITAL_COMMUNITY): Payer: Medicare HMO

## 2017-09-01 ENCOUNTER — Other Ambulatory Visit (HOSPITAL_COMMUNITY): Payer: Medicare HMO

## 2017-09-02 ENCOUNTER — Other Ambulatory Visit (HOSPITAL_COMMUNITY): Payer: Medicare HMO | Attending: Psychiatry | Admitting: Psychology

## 2017-09-02 DIAGNOSIS — F419 Anxiety disorder, unspecified: Secondary | ICD-10-CM | POA: Insufficient documentation

## 2017-09-02 DIAGNOSIS — F1994 Other psychoactive substance use, unspecified with psychoactive substance-induced mood disorder: Secondary | ICD-10-CM | POA: Insufficient documentation

## 2017-09-02 DIAGNOSIS — Z8619 Personal history of other infectious and parasitic diseases: Secondary | ICD-10-CM | POA: Diagnosis not present

## 2017-09-02 DIAGNOSIS — T43505A Adverse effect of unspecified antipsychotics and neuroleptics, initial encounter: Secondary | ICD-10-CM | POA: Diagnosis not present

## 2017-09-02 DIAGNOSIS — F17210 Nicotine dependence, cigarettes, uncomplicated: Secondary | ICD-10-CM | POA: Insufficient documentation

## 2017-09-02 DIAGNOSIS — Z62810 Personal history of physical and sexual abuse in childhood: Secondary | ICD-10-CM | POA: Diagnosis not present

## 2017-09-02 DIAGNOSIS — Z853 Personal history of malignant neoplasm of breast: Secondary | ICD-10-CM | POA: Insufficient documentation

## 2017-09-02 DIAGNOSIS — F332 Major depressive disorder, recurrent severe without psychotic features: Secondary | ICD-10-CM | POA: Diagnosis not present

## 2017-09-02 DIAGNOSIS — Z79899 Other long term (current) drug therapy: Secondary | ICD-10-CM | POA: Diagnosis not present

## 2017-09-02 DIAGNOSIS — F1020 Alcohol dependence, uncomplicated: Secondary | ICD-10-CM | POA: Insufficient documentation

## 2017-09-02 DIAGNOSIS — F4321 Adjustment disorder with depressed mood: Secondary | ICD-10-CM | POA: Insufficient documentation

## 2017-09-02 DIAGNOSIS — K2920 Alcoholic gastritis without bleeding: Secondary | ICD-10-CM | POA: Insufficient documentation

## 2017-09-02 DIAGNOSIS — G2401 Drug induced subacute dyskinesia: Secondary | ICD-10-CM | POA: Diagnosis not present

## 2017-09-02 DIAGNOSIS — F1120 Opioid dependence, uncomplicated: Secondary | ICD-10-CM

## 2017-09-02 DIAGNOSIS — I10 Essential (primary) hypertension: Secondary | ICD-10-CM | POA: Diagnosis not present

## 2017-09-04 ENCOUNTER — Encounter (HOSPITAL_COMMUNITY): Payer: Self-pay | Admitting: Psychology

## 2017-09-04 NOTE — Progress Notes (Signed)
    Daily Group Progress Note  Program: CD-IOP   09/04/2017 [name] [id number]  Diagnosis: F10.20 Alcohol use Disorder, severe, Opioid use disorder, severe  Sobriety Date:   Group Time: 1-2:30pm  Participation Level: Active  Behavioral Response: Sharing  Type of Therapy: Process Group  Interventions: Supportive  Topic: Patients were active and engaged in process session.  Patients shared accomplishments and challenges with relationships and recovery.  Patients shared about 12-step meetings that they have been attending, as well as support they have been building elsewhere.  The group welcomed a new member.  Counselors collected two UDS.  Group Time: 2:30-4pm  Participation Level: Active  Behavioral Response: Sharing  Type of Therapy: Psycho-education Group  Interventions: Psychosocial skills  Topic: The second half of group was spent in a psychoeducation session. Counselors facilitated discussion around the masks that patients had made to depict how they feel and identify themselves internally vs. what they express/show to others.  Counselors provided brief psychoeducation around the importance of vulnerability in recovery.  Patients responded well to psychoeducation and engaged in discussion about identity and shame.  Summary: Patient was active and engaged in her first group session and introduced herself to the group.  Patient shared that she was clean from 1995 to 2001, by working the steps with her sponsor and attending NA.  Patient said she recently was admitted to [facility] and was "hiding out" there until she was released on the 12th.  Patient said it was scary for her to leave [facility] but she feels she now has a "healthy fear".  Patient shared that she recently stepped away from an 18-year relationship, which was painful.  Patient shared she felt spiritually dead in her most recent active use and knew it was time to begin healing again.  Patient  responded well to the psychoeducation, identifying that she may hold anger and fear behind a faade of toughness or bluntness.    UDS collected: Yes Results: pending  AA/NA attended?: YesTuesday and Wednesday  Sponsor?: Yes   Brandon Melnick, LCAS 09/04/2017 3:38 PM

## 2017-09-06 ENCOUNTER — Other Ambulatory Visit (HOSPITAL_COMMUNITY): Payer: Medicare HMO | Admitting: Psychology

## 2017-09-06 ENCOUNTER — Encounter (HOSPITAL_COMMUNITY): Payer: Self-pay | Admitting: Medical

## 2017-09-06 VITALS — BP 132/74 | HR 77 | Ht 62.0 in | Wt 235.0 lb

## 2017-09-06 DIAGNOSIS — C801 Malignant (primary) neoplasm, unspecified: Secondary | ICD-10-CM

## 2017-09-06 DIAGNOSIS — T512X1A Toxic effect of 2-Propanol, accidental (unintentional), initial encounter: Secondary | ICD-10-CM

## 2017-09-06 DIAGNOSIS — G2401 Drug induced subacute dyskinesia: Secondary | ICD-10-CM

## 2017-09-06 DIAGNOSIS — Z62810 Personal history of physical and sexual abuse in childhood: Secondary | ICD-10-CM

## 2017-09-06 DIAGNOSIS — Z9189 Other specified personal risk factors, not elsewhere classified: Secondary | ICD-10-CM

## 2017-09-06 DIAGNOSIS — N183 Chronic kidney disease, stage 3 unspecified: Secondary | ICD-10-CM

## 2017-09-06 DIAGNOSIS — M545 Low back pain, unspecified: Secondary | ICD-10-CM | POA: Insufficient documentation

## 2017-09-06 DIAGNOSIS — F4329 Adjustment disorder with other symptoms: Secondary | ICD-10-CM

## 2017-09-06 DIAGNOSIS — G8929 Other chronic pain: Secondary | ICD-10-CM

## 2017-09-06 DIAGNOSIS — F332 Major depressive disorder, recurrent severe without psychotic features: Secondary | ICD-10-CM

## 2017-09-06 DIAGNOSIS — Z789 Other specified health status: Secondary | ICD-10-CM | POA: Insufficient documentation

## 2017-09-06 DIAGNOSIS — T43505A Adverse effect of unspecified antipsychotics and neuroleptics, initial encounter: Secondary | ICD-10-CM

## 2017-09-06 DIAGNOSIS — K2920 Alcoholic gastritis without bleeding: Secondary | ICD-10-CM

## 2017-09-06 DIAGNOSIS — Z8619 Personal history of other infectious and parasitic diseases: Secondary | ICD-10-CM

## 2017-09-06 DIAGNOSIS — F1020 Alcohol dependence, uncomplicated: Secondary | ICD-10-CM

## 2017-09-06 DIAGNOSIS — F4381 Prolonged grief disorder: Secondary | ICD-10-CM

## 2017-09-06 DIAGNOSIS — F1120 Opioid dependence, uncomplicated: Secondary | ICD-10-CM

## 2017-09-06 DIAGNOSIS — N179 Acute kidney failure, unspecified: Secondary | ICD-10-CM

## 2017-09-06 DIAGNOSIS — IMO0001 Reserved for inherently not codable concepts without codable children: Secondary | ICD-10-CM

## 2017-09-06 DIAGNOSIS — Z8659 Personal history of other mental and behavioral disorders: Secondary | ICD-10-CM

## 2017-09-06 DIAGNOSIS — Z853 Personal history of malignant neoplasm of breast: Secondary | ICD-10-CM

## 2017-09-06 DIAGNOSIS — F4321 Adjustment disorder with depressed mood: Secondary | ICD-10-CM

## 2017-09-06 DIAGNOSIS — I10 Essential (primary) hypertension: Secondary | ICD-10-CM

## 2017-09-06 DIAGNOSIS — F1994 Other psychoactive substance use, unspecified with psychoactive substance-induced mood disorder: Secondary | ICD-10-CM

## 2017-09-06 HISTORY — DX: Other chronic pain: G89.29

## 2017-09-06 HISTORY — DX: Drug induced subacute dyskinesia: G24.01

## 2017-09-06 HISTORY — DX: Low back pain, unspecified: M54.50

## 2017-09-06 HISTORY — DX: Other specified health status: Z78.9

## 2017-09-06 HISTORY — DX: Malignant (primary) neoplasm, unspecified: C80.1

## 2017-09-06 HISTORY — DX: Adjustment disorder with other symptoms: F43.29

## 2017-09-06 HISTORY — DX: Prolonged grief disorder: F43.81

## 2017-09-06 HISTORY — DX: Drug induced subacute dyskinesia: T43.505A

## 2017-09-06 MED ORDER — HYDROXYZINE PAMOATE 50 MG PO CAPS: ORAL_CAPSULE | ORAL | 2 refills | Status: DC

## 2017-09-06 MED ORDER — PANTOPRAZOLE SODIUM 20 MG PO TBEC: 20.0000 mg | DELAYED_RELEASE_TABLET | Freq: Two times a day (BID) | ORAL | 0 refills | Status: DC

## 2017-09-06 MED ORDER — HYDROXYZINE HCL 25 MG PO TABS: 25.0000 mg | ORAL_TABLET | Freq: Four times a day (QID) | ORAL | 2 refills | Status: DC | PRN

## 2017-09-06 MED ORDER — LABETALOL HCL 200 MG PO TABS: 200.0000 mg | ORAL_TABLET | Freq: Two times a day (BID) | ORAL | 0 refills | Status: DC

## 2017-09-06 MED ORDER — MELATONIN 5 MG SL LOZG: 1.0000 | LOZENGE | Freq: Every evening | SUBLINGUAL | 5 refills | Status: AC | PRN

## 2017-09-06 MED ORDER — VALBENAZINE TOSYLATE 80 MG PO CAPS: 80.0000 mg | ORAL_CAPSULE | ORAL | 5 refills | Status: DC

## 2017-09-06 MED ORDER — GABAPENTIN 400 MG PO CAPS: 400.0000 mg | ORAL_CAPSULE | Freq: Three times a day (TID) | ORAL | 2 refills | Status: DC

## 2017-09-06 MED ORDER — MIRTAZAPINE 15 MG PO TABS: 15.0000 mg | ORAL_TABLET | Freq: Every day | ORAL | 2 refills | Status: DC

## 2017-09-06 MED ORDER — DIVALPROEX SODIUM ER 500 MG PO TB24: 500.0000 mg | ORAL_TABLET | Freq: Every day | ORAL | 2 refills | Status: DC
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
## 2017-09-06 NOTE — Progress Notes (Signed)
Has run out of her Julio Alm which helps control her movements and makes her feel better

## 2017-09-07 ENCOUNTER — Encounter (HOSPITAL_COMMUNITY): Payer: Self-pay | Admitting: Psychology
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
## 2017-09-08 ENCOUNTER — Other Ambulatory Visit (HOSPITAL_COMMUNITY): Payer: Medicare HMO | Admitting: Psychology

## 2017-09-08 ENCOUNTER — Other Ambulatory Visit (HOSPITAL_COMMUNITY): Payer: Self-pay

## 2017-09-08 ENCOUNTER — Encounter (HOSPITAL_COMMUNITY): Payer: Self-pay | Admitting: Medical

## 2017-09-08 DIAGNOSIS — Z8659 Personal history of other mental and behavioral disorders: Secondary | ICD-10-CM

## 2017-09-08 DIAGNOSIS — Z789 Other specified health status: Secondary | ICD-10-CM

## 2017-09-08 DIAGNOSIS — M5440 Lumbago with sciatica, unspecified side: Secondary | ICD-10-CM

## 2017-09-08 DIAGNOSIS — N183 Chronic kidney disease, stage 3 unspecified: Secondary | ICD-10-CM

## 2017-09-08 DIAGNOSIS — F4329 Adjustment disorder with other symptoms: Secondary | ICD-10-CM

## 2017-09-08 DIAGNOSIS — F1120 Opioid dependence, uncomplicated: Secondary | ICD-10-CM

## 2017-09-08 DIAGNOSIS — F1020 Alcohol dependence, uncomplicated: Secondary | ICD-10-CM | POA: Diagnosis not present

## 2017-09-08 DIAGNOSIS — K2920 Alcoholic gastritis without bleeding: Secondary | ICD-10-CM

## 2017-09-08 DIAGNOSIS — F332 Major depressive disorder, recurrent severe without psychotic features: Secondary | ICD-10-CM

## 2017-09-08 DIAGNOSIS — Z8619 Personal history of other infectious and parasitic diseases: Secondary | ICD-10-CM

## 2017-09-08 DIAGNOSIS — T512X1A Toxic effect of 2-Propanol, accidental (unintentional), initial encounter: Secondary | ICD-10-CM

## 2017-09-08 DIAGNOSIS — Z9189 Other specified personal risk factors, not elsewhere classified: Secondary | ICD-10-CM

## 2017-09-08 DIAGNOSIS — F4321 Adjustment disorder with depressed mood: Secondary | ICD-10-CM

## 2017-09-08 DIAGNOSIS — F1994 Other psychoactive substance use, unspecified with psychoactive substance-induced mood disorder: Secondary | ICD-10-CM

## 2017-09-08 DIAGNOSIS — N179 Acute kidney failure, unspecified: Secondary | ICD-10-CM

## 2017-09-08 DIAGNOSIS — Z62810 Personal history of physical and sexual abuse in childhood: Secondary | ICD-10-CM

## 2017-09-08 DIAGNOSIS — G8929 Other chronic pain: Secondary | ICD-10-CM

## 2017-09-08 DIAGNOSIS — Z853 Personal history of malignant neoplasm of breast: Secondary | ICD-10-CM

## 2017-09-08 DIAGNOSIS — F4381 Prolonged grief disorder: Secondary | ICD-10-CM

## 2017-09-08 DIAGNOSIS — G2401 Drug induced subacute dyskinesia: Secondary | ICD-10-CM

## 2017-09-08 DIAGNOSIS — T43505A Adverse effect of unspecified antipsychotics and neuroleptics, initial encounter: Secondary | ICD-10-CM

## 2017-09-08 DIAGNOSIS — I10 Essential (primary) hypertension: Secondary | ICD-10-CM

## 2017-09-08 MED ORDER — VALBENAZINE TOSYLATE 80 MG PO CAPS: 80.0000 mg | ORAL_CAPSULE | ORAL | 5 refills | Status: AC
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
## 2017-09-09 ENCOUNTER — Other Ambulatory Visit (HOSPITAL_COMMUNITY): Payer: Medicare HMO | Admitting: Psychology

## 2017-09-09 DIAGNOSIS — F1020 Alcohol dependence, uncomplicated: Secondary | ICD-10-CM | POA: Diagnosis not present

## 2017-09-09 DIAGNOSIS — F332 Major depressive disorder, recurrent severe without psychotic features: Secondary | ICD-10-CM

## 2017-09-09 DIAGNOSIS — F1120 Opioid dependence, uncomplicated: Secondary | ICD-10-CM

## 2017-09-10 ENCOUNTER — Telehealth (HOSPITAL_COMMUNITY): Payer: Self-pay | Admitting: Psychology

## 2017-09-10 DIAGNOSIS — 419620001 Death: Secondary | SNOMED CT | POA: Diagnosis not present
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
## 2017-09-10 DEATH — deceased

## 2017-09-13 ENCOUNTER — Other Ambulatory Visit (HOSPITAL_COMMUNITY): Payer: Medicare HMO | Attending: Psychiatry | Admitting: Psychology

## 2017-09-13 ENCOUNTER — Encounter (HOSPITAL_COMMUNITY): Payer: Self-pay | Admitting: Medical

## 2017-09-13 DIAGNOSIS — I10 Essential (primary) hypertension: Secondary | ICD-10-CM

## 2017-09-13 DIAGNOSIS — R42 Dizziness and giddiness: Secondary | ICD-10-CM

## 2017-09-13 DIAGNOSIS — N183 Chronic kidney disease, stage 3 unspecified: Secondary | ICD-10-CM

## 2017-09-13 DIAGNOSIS — F1120 Opioid dependence, uncomplicated: Secondary | ICD-10-CM

## 2017-09-13 DIAGNOSIS — I129 Hypertensive chronic kidney disease with stage 1 through stage 4 chronic kidney disease, or unspecified chronic kidney disease: Secondary | ICD-10-CM | POA: Diagnosis not present

## 2017-09-13 DIAGNOSIS — F4321 Adjustment disorder with depressed mood: Secondary | ICD-10-CM | POA: Diagnosis not present

## 2017-09-13 DIAGNOSIS — K2920 Alcoholic gastritis without bleeding: Secondary | ICD-10-CM

## 2017-09-13 DIAGNOSIS — T43505A Adverse effect of unspecified antipsychotics and neuroleptics, initial encounter: Secondary | ICD-10-CM

## 2017-09-13 DIAGNOSIS — F1020 Alcohol dependence, uncomplicated: Secondary | ICD-10-CM | POA: Diagnosis present

## 2017-09-13 DIAGNOSIS — F1994 Other psychoactive substance use, unspecified with psychoactive substance-induced mood disorder: Secondary | ICD-10-CM | POA: Diagnosis present

## 2017-09-13 DIAGNOSIS — G8929 Other chronic pain: Secondary | ICD-10-CM

## 2017-09-13 DIAGNOSIS — N179 Acute kidney failure, unspecified: Secondary | ICD-10-CM | POA: Insufficient documentation

## 2017-09-13 DIAGNOSIS — Z8659 Personal history of other mental and behavioral disorders: Secondary | ICD-10-CM

## 2017-09-13 DIAGNOSIS — G2401 Drug induced subacute dyskinesia: Secondary | ICD-10-CM | POA: Diagnosis not present

## 2017-09-13 DIAGNOSIS — Z79899 Other long term (current) drug therapy: Secondary | ICD-10-CM | POA: Diagnosis not present

## 2017-09-13 DIAGNOSIS — Z9011 Acquired absence of right breast and nipple: Secondary | ICD-10-CM | POA: Insufficient documentation

## 2017-09-13 DIAGNOSIS — M5440 Lumbago with sciatica, unspecified side: Secondary | ICD-10-CM

## 2017-09-13 DIAGNOSIS — F4329 Adjustment disorder with other symptoms: Secondary | ICD-10-CM

## 2017-09-13 DIAGNOSIS — F319 Bipolar disorder, unspecified: Secondary | ICD-10-CM | POA: Diagnosis not present

## 2017-09-13 DIAGNOSIS — Z8619 Personal history of other infectious and parasitic diseases: Secondary | ICD-10-CM | POA: Diagnosis not present

## 2017-09-13 DIAGNOSIS — F4381 Prolonged grief disorder: Secondary | ICD-10-CM

## 2017-09-13 DIAGNOSIS — Z789 Other specified health status: Secondary | ICD-10-CM

## 2017-09-13 DIAGNOSIS — Z9189 Other specified personal risk factors, not elsewhere classified: Secondary | ICD-10-CM

## 2017-09-13 DIAGNOSIS — Z62810 Personal history of physical and sexual abuse in childhood: Secondary | ICD-10-CM

## 2017-09-13 DIAGNOSIS — Z853 Personal history of malignant neoplasm of breast: Secondary | ICD-10-CM | POA: Diagnosis not present

## 2017-09-13 DIAGNOSIS — IMO0001 Reserved for inherently not codable concepts without codable children: Secondary | ICD-10-CM
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
## 2017-09-14 ENCOUNTER — Other Ambulatory Visit (HOSPITAL_COMMUNITY): Payer: Self-pay

## 2017-09-14 DIAGNOSIS — Z79899 Other long term (current) drug therapy: Secondary | ICD-10-CM

## 2017-09-14 DIAGNOSIS — N183 Chronic kidney disease, stage 3 unspecified: Secondary | ICD-10-CM

## 2017-09-15 ENCOUNTER — Other Ambulatory Visit (HOSPITAL_COMMUNITY): Payer: Self-pay | Admitting: Medical

## 2017-09-15 ENCOUNTER — Other Ambulatory Visit (INDEPENDENT_AMBULATORY_CARE_PROVIDER_SITE_OTHER): Payer: Medicare HMO | Admitting: Psychology

## 2017-09-15 ENCOUNTER — Encounter: Payer: Self-pay | Admitting: Medical

## 2017-09-15 ENCOUNTER — Encounter (HOSPITAL_COMMUNITY): Payer: Self-pay | Admitting: Medical

## 2017-09-15 ENCOUNTER — Encounter (HOSPITAL_COMMUNITY): Payer: Self-pay | Admitting: Psychology

## 2017-09-15 DIAGNOSIS — N183 Chronic kidney disease, stage 3 unspecified: Secondary | ICD-10-CM

## 2017-09-15 DIAGNOSIS — R632 Polyphagia: Secondary | ICD-10-CM

## 2017-09-15 DIAGNOSIS — F1120 Opioid dependence, uncomplicated: Secondary | ICD-10-CM | POA: Diagnosis not present

## 2017-09-15 LAB — BASIC METABOLIC PANEL
BUN/Creatinine Ratio: 24 (ref 12–28)
BUN: 48 mg/dL — ABNORMAL HIGH (ref 8–27)
CO2: 16 mmol/L — ABNORMAL LOW (ref 20–29)
Calcium: 9.8 mg/dL (ref 8.7–10.3)
Chloride: 105 mmol/L (ref 96–106)
Creatinine, Ser: 2.01 mg/dL — ABNORMAL HIGH (ref 0.57–1.00)
GFR calc Af Amer: 30 mL/min/{1.73_m2} — ABNORMAL LOW (ref >59)
GFR calc non Af Amer: 26 mL/min/{1.73_m2} — ABNORMAL LOW (ref >59)
Glucose: 73 mg/dL (ref 65–99)
Potassium: 5.4 mmol/L — ABNORMAL HIGH (ref 3.5–5.2)
Sodium: 141 mmol/L (ref 134–144)

## 2017-09-15 LAB — HEMOGLOBIN A1C
Est. average glucose Bld gHb Est-mCnc: 105 mg/dL
Hgb A1c MFr Bld: 5.3 % (ref 4.8–5.6)

## 2017-09-15 MED ORDER — NICOTINE 7 MG/24HR TD PT24: 7.0000 mg | MEDICATED_PATCH | Freq: Every day | TRANSDERMAL | 0 refills | Status: AC

## 2017-09-15 MED ORDER — NICOTINE 21 MG/24HR TD PT24: 21.0000 mg | MEDICATED_PATCH | Freq: Every day | TRANSDERMAL | 0 refills | Status: AC

## 2017-09-15 MED ORDER — NICOTINE 14 MG/24HR TD PT24: 14.0000 mg | MEDICATED_PATCH | Freq: Every day | TRANSDERMAL | 0 refills | Status: AC

## 2017-09-15 MED ORDER — HYDROXYZINE HCL 25 MG PO TABS: 25.0000 mg | ORAL_TABLET | Freq: Four times a day (QID) | ORAL | 2 refills | Status: DC | PRN

## 2017-09-15 NOTE — Progress Notes (Addendum)
    Daily Group Progress Note  Program: CD-IOP   09/15/2017 Sherry [name] [id number]  Diagnosis: Opioid use disorder, severe Polyphagia - Diabetes Lab results  Chronic kidney disease, stage III (moderate) ([place])   Sobriety Date: 08/11/17  Group Time: 1-2:30pm  Participation Level: Active  Behavioral Response: sharing  Type of Therapy: Process  Interventions: Supportive  Topic: Process: The first half of group was spent in process. Members shared about their week since we last met. Their check-ins included any speed bumps or shining moments along with the number of recovery-based meetings they had attended. A new group member was present and he introduced himself to the group in this half of the session. The medical director met with the new member and the member graduating successfully from group tomorrow. Random drug tests were collected from three members.   Group Time: 2:30-4pm  Participation Level: active Behavioral Response: appropriate  Type of Therapy:Psycho-ed  Interventions: Strength-based  Topic: Psycho-Ed: The Neurobiology of Addiction. The second half of group was spent in a psycho-ed on the neurobiology of addiction. A brief slide show was presented and then clips from the documentary, "Pleasure Unwoven". A discussion ensued with members providing feedback on the concept of a 'maladaptive coping mechanism'. The session was informative and members responded favorably to the new information and insight the presentation provided them.    Summary: The patient reported she ahd attended one NA meeting. She expressed frustration over her failure to be compliant with the requirements of this program. She admitted her sponsor told her, "you're lazy". The patient received helpful feedback from group members and counselors about how to contact women in recovery and schedule rides. The patient expressed frustration with her cousin, who is her hair Market researcher and also  very critical of her lifestyle.  In the psycho-ed, the patient could identify triggers and how her exposure to certain elements generates cravings. The patient makes good comments and displays insight about recovery, but little about her own self and what she does to promote or derail her sobriety. It seems the group is frustrated with the patient's ability to dish it out, but not walk the talk. She remains drug-free.   UDS collected: Yes, pending  AA/NA attended: No  Sponsor?: Yes   Brandon Melnick, LCAS 09/15/2017 9:16 PM

## 2017-09-15 NOTE — Progress Notes (Signed)
Patient ID: Sherry [name], female   DOB: 1955-12-31, 62 y.o.   MRN: [id number] [name], Sherry [name] (MRN [id number]) as of 09/15/2017 13:29  Ref. Range 08/10/2017 06:05 08/11/2017 14:39 08/12/2017 06:07 08/24/2017 18:42 09/13/2017 42:68  BASIC METABOLIC PANEL Unknown   Rpt (A)  Rpt (A)  COMPREHENSIVE METABOLIC PANEL Unknown Rpt (A)   Rpt (A)   Sodium Latest Ref Range: 134 - 144 mmol/L 142  140 142 141  Potassium Latest Ref Range: 3.5 - 5.2 mmol/L 3.8  4.0 4.4 5.4 (H)  Chloride Latest Ref Range: 96 - 106 mmol/L 115 (H)  111 108 105  CO2 Latest Ref Range: 20 - 29 mmol/L 19 (L)  19 (L) 22 16 (L)  Glucose Latest Ref Range: 65 - 99 mg/dL 97  92 129 (H) 73  BUN Latest Ref Range: 8 - 27 mg/dL 26 (H)  15 30 (H) 48 (H)  Creatinine Latest Ref Range: 0.57 - 1.00 mg/dL 2.08 (H)  1.44 (H) 1.88 (H) 2.01 (H)  Calcium Latest Ref Range: 8.7 - 10.3 mg/dL 8.7 (L)  9.4 9.0 9.8  Anion gap Latest Ref Range: 5 - 15  8  10 12    BUN/Creatinine Ratio Latest Ref Range: 12 - 28      24  Alkaline Phosphatase Latest Ref Range: 38 - 126 U/L 74   95   Albumin Latest Ref Range: 3.5 - 5.0 g/dL 3.0 (L)   3.3 (L)   AST Latest Ref Range: 15 - 41 U/L 16   19   ALT Latest Ref Range: 14 - 54 U/L 13 (L)   12 (L)   Total Protein Latest Ref Range: 6.5 - 8.1 g/dL 6.2 (L)   7.2   Total Bilirubin Latest Ref Range: 0.3 - 1.2 mg/dL 0.2 (L)   0.3   GFR, Est Non African American Latest Ref Range: >59 mL/min/1.73 25 (L)  38 (L) 28 (L) 26 (L)  GFR, Est African American Latest Ref Range: >59 mL/min/1.73 28 (L)  44 (L) 32 (L) 30 (L)  Iron Latest Ref Range: 28 - 170 ug/dL  23 (L)     UIBC Latest Units: ug/dL  300     TIBC Latest Ref Range: 250 - 450 ug/dL  323     Saturation Ratios Latest Ref Range: 10.4 - 31.8 %  7 (L)     Ferritin Latest Ref Range: 11 - 307 ng/mL  8 (L)     WBC Latest Ref Range: 4.0 - 10.5 K/uL 6.7   9.7   RBC Latest Ref Range: 3.87 - 5.11 MIL/uL 3.42 (L)   3.01 (L)   Hemoglobin Latest Ref Range: 12.0 - 15.0 g/dL 8.0 (L)   7.4  (L)   HCT Latest Ref Range: 36.0 - 46.0 % 27.0 (L)   24.1 (L)   MCV Latest Ref Range: 78.0 - 100.0 fL 78.9   80.1   MCH Latest Ref Range: 26.0 - 34.0 pg 23.4 (L)   24.6 (L)   MCHC Latest Ref Range: 30.0 - 36.0 g/dL 29.6 (L)   30.7   RDW Latest Ref Range: 11.5 - 15.5 % 18.0 (H)   18.4 (H)   Platelets Latest Ref Range: 150 - 400 K/uL 333   356   Neutrophils Latest Units: %    73   Lymphocytes Latest Units: %    21   Monocytes Relative Latest Units: %    4   Eosinophil Latest Units: %    2  Basophil Latest Units: %    0   NEUT# Latest Ref Range: 1.7 - 7.7 K/uL    7.0   Lymphocyte # Latest Ref Range: 0.7 - 4.0 K/uL    2.1   Monocyte # Latest Ref Range: 0.1 - 1.0 K/uL    0.4   Eosinophils Absolute Latest Ref Range: 0.0 - 0.7 K/uL    0.2   Basophils Absolute Latest Ref Range: 0.0 - 0.1 K/uL    0.0   Valproic Acid,S Latest Ref Range: 50.0 - 100.0 ug/mL    13 (L)   Hemoglobin A1C Latest Ref Range: 4.8 - 5.6 %     5.3  Est. average glucose Bld gHb Est-mCnc Latest Units: mg/dL     105  -copy to patient Advised too FU with PCP/Renal Specialist ASAP

## 2017-09-15 NOTE — Progress Notes (Signed)
Pt requested nicotene patch-RX sent

## 2017-09-15 NOTE — Progress Notes (Signed)
    Daily Group Progress Note  Program: CD-IOP   09/15/2017 [name] [id number]  Diagnosis:  Opioid use disorder, severe, dependence (HCC)  Alcohol use disorder, severe, dependence ([place])  MDD (major depressive disorder), recurrent severe, without psychosis ([place])   Sobriety Date: 08/11/17  Group Time: 1-2:30  Participation Level: Active  Behavioral Response: Appropriate and Sharing  Type of Therapy: Process Group  Interventions: CBT and Supportive  Topic: Patients were active and engaged in process session. Counselors led pt's in discussion concerning their recovery from mind-altering drugs. Emphasis on goals from tx plan and thoughts and feelings processing. UDS results were returned to some individuals. One pt graduated successfully and some of her family members were present for final 10 mins of session.      Group Time: 2:30-4  Participation Level: Active  Behavioral Response: Appropriate and Sharing  Type of Therapy: Psycho-education Group  Interventions: CBT and Psychosocial Skills: Sleep Hygiene  Topic: Patients were active and engaged in group psychoeducation session. Tamera Reason taught for 1 hour on "sleep hygiene". Pts asked questions and were attentive, responsive.    Summary: Pt was active and engaged in session. She reported she attended no AA meetings since yesterday. She states she relates closely to another member who was struggling w/ creating psychical intimacy w/ his partner. Pt reported on a hx of a significant romantic relationship who caused pt "much distress" and made her feel insecure. Pt verbalized insight into some codependency problems w/ former partner. Pt met w/ sponsor and discussed a recent conflict, asking for help w/ assertiveness and communication skills. Pt apologized to person and verbalized understanding of her fault in conflict. Pt was active and attentive during sleep psychoeducation session.   UDS collected: No Results:  positive for benzodiazepines  AA/NA attended?: No  Sponsor?: Yes   Wes Shyasia Funches, LPCA LCASA 09/15/2017 9:00 AM

## 2017-09-16 ENCOUNTER — Other Ambulatory Visit (HOSPITAL_COMMUNITY): Payer: Medicare HMO | Admitting: Psychology

## 2017-09-16 DIAGNOSIS — F1020 Alcohol dependence, uncomplicated: Secondary | ICD-10-CM

## 2017-09-16 DIAGNOSIS — F1120 Opioid dependence, uncomplicated: Secondary | ICD-10-CM

## 2017-09-16 DIAGNOSIS — F1994 Other psychoactive substance use, unspecified with psychoactive substance-induced mood disorder: Secondary | ICD-10-CM

## 2017-09-17 NOTE — Progress Notes (Signed)
    Daily Group Progress Note  Program: CD-IOP   09/17/2017 [name] [id number]  Diagnosis:  Opioid use disorder, severe, dependence ([place])  Alcohol use disorder, severe, dependence ([place])  Substance induced mood disorder ([place])   Sobriety Date: 1/30  Group Time: 1-2:30pm  Participation Level: Active  Behavioral Response: Appropriate  Type of Therapy: Process Group  Interventions: CBT, Strength-based and Supportive  Topic: Patients were active and engaged in process session.  Patients shared about recent challenges and successes in their recovery and relationships.  Patients encouraged, challenged one another, and provided appropriate feedback.  The group celebrated a [employer] graduation and welcomed a new member.  Counselors collected one UDS.     Group Time: 2:30-4pm  Participation Level: Active  Behavioral Response: Appropriate  Type of Therapy: Psycho-education Group  Interventions: CBT and Other: Relapse Prevention  Topic: Patients were active and engaged in psychoeducation session, in which counselors led discussion around the sequence of trigger --> thought --> craving --> use.  Counselors provided psychoeducation around preventing relapse and encouraged patients to consider ways that are already effectively coping and strategies they would like to try.  Patients responded well to psychoeducation.   Summary: Patient was active and engaged in session.  Patient provided thoughtful feedback to other group members, speaking from her own experiences.  Patient said she took the advice of another group member and slept without her TV on, which "went okay".  Patient also spoke to her cousin, who she had recently had some disagreements with, and they expressed love for each other and somewhat made amends.  Patient said she tries to be in the habit of "telling on herself" when she has a thought, so that she can intercept it before it becomes a craving.   UDS collected:  No Results: N/A  AA/NA attended?: No  Sponsor?: Yes   [name], LCAS 09/17/2017 11:45 AM

## 2017-09-20 ENCOUNTER — Other Ambulatory Visit (HOSPITAL_COMMUNITY): Payer: Medicare HMO | Admitting: Psychology

## 2017-09-20 ENCOUNTER — Encounter (HOSPITAL_COMMUNITY): Payer: Self-pay | Admitting: Psychology

## 2017-09-20 DIAGNOSIS — F1120 Opioid dependence, uncomplicated: Secondary | ICD-10-CM | POA: Diagnosis not present

## 2017-09-20 DIAGNOSIS — F1994 Other psychoactive substance use, unspecified with psychoactive substance-induced mood disorder: Secondary | ICD-10-CM

## 2017-09-20 DIAGNOSIS — F1020 Alcohol dependence, uncomplicated: Secondary | ICD-10-CM
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
## 2017-09-21 ENCOUNTER — Telehealth (HOSPITAL_COMMUNITY): Payer: Self-pay

## 2017-09-21 NOTE — Telephone Encounter (Signed)
Patient called and would like to know if you can prescribe her Hydrochlorothiazide, 50 mg daily. Please review and advise, thank you

## 2017-09-22 ENCOUNTER — Other Ambulatory Visit (HOSPITAL_COMMUNITY): Payer: Medicare HMO | Admitting: Psychology

## 2017-09-22 ENCOUNTER — Encounter (HOSPITAL_COMMUNITY): Payer: Self-pay | Admitting: Psychology

## 2017-09-22 DIAGNOSIS — F1020 Alcohol dependence, uncomplicated: Secondary | ICD-10-CM

## 2017-09-22 DIAGNOSIS — F1120 Opioid dependence, uncomplicated: Secondary | ICD-10-CM | POA: Diagnosis not present

## 2017-09-22 NOTE — Telephone Encounter (Signed)
She is CD IOP pt Sherry [name] can handle in [place] She needs to call/see PCP on record.She was going to establish with [facility] next door so she could get care here-Also GYN here is possibility-maybe you can help her with this if she hasnt done it already

## 2017-09-22 NOTE — Telephone Encounter (Signed)
CD IOP Pt Sherry [name] can handle Pt needs to call/PCP on record-she was going to establish here with GYN and [facility] next door to get care in same area-

## 2017-09-22 NOTE — Progress Notes (Signed)
CD-IOP INDIVIDUAL SESSION  THERAPIST PROGRESS NOTE  Session Time: 12-1pm  Participation Level: Active  Behavioral Response: NeatAlertEuthymic  Type of Therapy: Individual Therapy  Treatment Goals addressed: Coping  Interventions: CBT, Assertiveness Training and Supportive  Summary: Sherry [name] is a 62 y.o. female who presents with .   Complacency. Counselor reflects that pt could be in danger of complacency w/ her recovery. Pt asks why. Counselor suggests pt may need to "do things different than previously, since that hasn't worked".   "I need to exercise more but I get dizzy; I have access to "silver sneakers" but I haven't tried it yet."  "I realize now I'm just making excuses"  Counselor and pt discuss blocks that are impeding pt's progress including not having her license, low finances, and "feeling lazy". Counselor and pt discuss "Challenging negative thinking", and not limiting herself in her options for getting her needs met.  Counselor suggests a Scientific laboratory technician and pt agrees this could help her stay on target.  Suicidal/Homicidal: Nowithout intent/plan  Therapist Response: CBT, supportive, encouraging, goal-setting  Plan: Return again in 1 weeks.  Wes Kainat Pizana, LPCA LCASA [facility]CIOP[facility] CHEM 09/22/2017

## 2017-09-23 ENCOUNTER — Encounter (HOSPITAL_COMMUNITY): Payer: Self-pay | Admitting: Psychology

## 2017-09-23 ENCOUNTER — Other Ambulatory Visit (HOSPITAL_BASED_OUTPATIENT_CLINIC_OR_DEPARTMENT_OTHER): Payer: Medicare HMO | Admitting: Psychology

## 2017-09-23 DIAGNOSIS — F1120 Opioid dependence, uncomplicated: Secondary | ICD-10-CM | POA: Diagnosis not present

## 2017-09-23 DIAGNOSIS — F1020 Alcohol dependence, uncomplicated: Secondary | ICD-10-CM

## 2017-09-23 NOTE — Progress Notes (Signed)
    Daily Group Progress Note  Program: CD-IOP   09/23/2017 Sherry [name] [id number]  Diagnosis: Opioid Use Disorder, Severe, Alcohol Use disorder, severe  Sobriety Date: 08/11/17  Group Time: 1-2:30pm  Participation Level: Active  Behavioral Response: Appropriate  Type of Therapy: Process Group  Interventions: Supportive  Topic: Patients were active and engaged in process session.  Patients shared accomplishments and challenges with relationships and recovery.  Patients shared about feelings they experienced over the past few days and how they responded to challenging situations.  Patients encouraged and challenged one another appropriately.  The group celebrated the graduation of a group member and welcomed a new group member.  Counselors collected two UDS.  Group Time: 2:30-4pm  Participation Level: Active  Behavioral Response: Sharing  Type of Therapy: Psycho-education Group  Interventions: Solution Focused and Strength-based  Topic: Triggers and Relapse Prevention/Graduation: In this psychoeducation, session counselors led discussion and development of relapse prevention plan.  Counselors reviewed triggerthought craving use pattern and how patients can inhibit the thought to craving.  Counselors led patients in developing a relapse prevention plan. Patients were provided handout and which asked them to identify positive coping skills and positive social supports.  Patients responded well to psychoeducation on relapse prevention strategies and plans. A graduation ceremony was held to honor a member successfully completing the program today. Brownies were served and the medallion passed around the group. The patient's mother appeared for the ceremony and had her own kind words and gifts for her daughter.  Summary: Patient was active and engaged in session.  Patient said she and counselor spoke about how she has been "making excuses" and that she is going to make more of an  effort and be honest.  Patient said that she called 10-15 people over the weekend to try to get a ride to a meeting.  She also met with medical director today for med check. Patient said she spent time cleaning her home the other day.  Patient said she struggles with feeling shame over relapsing so many times and needs to understand moving forward in her heart more, as she already has the functional knowledge she needs to stay clean.  Patient identified watching television and calling people as her main coping strategies and said that her sister is her top social support in relapse prevention. Patient shared kind words with the graduating member during the ceremony.    UDS collected: No Results:   AA/NA attended?: YesFriday  Sponsor?: Yes   Brandon Melnick, LCAS 09/23/2017 10:30 AM

## 2017-09-24 ENCOUNTER — Encounter (HOSPITAL_COMMUNITY): Payer: Self-pay | Admitting: Psychology

## 2017-09-24 ENCOUNTER — Encounter (HOSPITAL_COMMUNITY): Payer: Self-pay

## 2017-09-24 NOTE — Progress Notes (Signed)
   CD-IOP INDIVIDUAL SESSION THERAPIST PROGRESS NOTE  Session Time: 12-1  Participation Level: Active  Behavioral Response: Neat and Well GroomedAlertEuthymic  Type of Therapy: Individual Therapy  Treatment Goals addressed: Coping and Diagnosis: SUD  Interventions: CBT and Motivational Interviewing  Summary: Sherry [name] is a 62 y.o. female who presents for her weekly individual session as part of CD-IOP. She is engaged in session. Her TD akathesia sxs appear less severe and disrupting than previous individual session. Counselor and pt discuss pt's individual goals. Counselor and pt "set agenda" in session and discuss pt's apparent lack of motivation, "laziness", and psychological blocks to accomplishing her goals. Pt is open to receiving feedback about her need to "change her bxs in recovery, since the way she has always done it is not working well enough". Counselor and pt sign a behavioral contact w/ some goals that pt wants to meet, including "walking around her neighborhood weekly, and getting involved in [place] before she d/c from CD-IOP. Pt is agreeable to this and verbalizes understanding and consent to bx contract.   Suicidal/Homicidal: Nowithout intent/plan  Therapist Response: Counselor used support, encouragement, validation, open questions, MI to help resolve ambivalence towards goals, active listening, and challenging feedback.  Plan: Return again in 1 weeks.  Diagnosis:    ICD-10-CM   1. Alcohol use disorder, severe, dependence ([place]) F10.20        [facility]-CIOPB CHEM 09/24/2017

## 2017-09-24 NOTE — Progress Notes (Signed)
    Daily Group Progress Note  Program: CD-IOP   09/24/2017 [name] [id number]  Diagnosis:  Opioid use disorder, severe, dependence ([facility])  Alcohol use disorder, severe, dependence ([place])  Substance induced mood disorder ([place])   Sobriety Date: 1/30  Group Time: 1-2:30  Participation Level: Active  Behavioral Response: Appropriate and Sharing  Type of Therapy: Process Group  Interventions: CBT and Supportive  Topic: Patients were active and engaged in process session. Counselors led pt's in discussion concerning their recovery from mind-altering drugs. Emphasis on goals from tx plan and thoughts and feelings processing.       Group Time: 2:30-4  Participation Level: Active  Behavioral Response: Appropriate and Sharing  Type of Therapy: Psycho-education Group  Interventions: CBT and Psychosocial Skills: relapse Prevention, Internal/ External Triggers  Topic: Patients were active and engaged in group psychoeducation session. Counselor led 1.5 hour group on external and internal triggers, recognizing, responding, and introducing mindfulness. Handouts from [facility] were provided and pts shared their responses to their personal triggers. Pts gained insight and shared feedback.    Summary: Pt was active and engaged in session. She presents w/ slightly diminished sxs from TD and reports she has started the [place]. Pt stated she attended 1 NA meeting over the weekend and was "dissapointed in herself" for this. She is trying to "manage her expectations of others" and working on "learning to communicate better w/ others to get her needs met". She provided feedback for another group member who was struggling w/ triggers. She identified her own triggers as "primarily part of routine, mostly internal, and the feelings of depression and frustration". Pt continues to discuss her frustration w/ herself and her need to "get more motivation to do the work".    UDS collected:  No Results: Pending  AA/NA attended?: YesFriday  Sponsor?: Yes   [name], LPCA LCASA 09/24/2017 8:32 AM

## 2017-09-27 ENCOUNTER — Telehealth (HOSPITAL_COMMUNITY): Payer: Self-pay | Admitting: Psychology

## 2017-09-27 ENCOUNTER — Other Ambulatory Visit (HOSPITAL_COMMUNITY): Payer: Medicare HMO | Admitting: Psychology

## 2017-09-28 NOTE — Progress Notes (Signed)
    Daily Group Progress Note  Program: CD-IOP   09/28/2017 Sherry [name] [id number]  Diagnosis: F10.20 Alcohol use disorder, severe, dependence ([place])   Sobriety Date: 08/11/17  Group Time: 1-2:30pm  Participation Level: Active  Behavioral Response: Sharing  Type of Therapy: Process Group  Interventions: Supportive  Topic: Process:  The first part of group was spent in process. Members shared about any struggles or challenges they had faced since we last met. One member returned to group after having missed three group sessions. The newest member, who first appeared yesterday, shared about his thwarted effort to secure drugs last night. His mother found the drugs before he did so he remained drug-free, but the event was upsetting for both of them. The conversation was lively with good feedback and disclosure among group members.   Group Time: 2:30-4pm  Participation Level: Active  Behavioral Response: Sharing  Type of Therapy: Psycho-education Group  Interventions: Solution Focused and Strength-based  Topic: Psycho-Education: The Relapse Cycle, Part 2. The second half of group was a psycho-ed continuing where group had left off yesterday. One member was asked to explain the four-part relapse process. She described it succinctly and was invited to reference the newest group member's story about last night and his effort to secure drugs. She was able to identify his trigger, the subsequent thought and generation of cravings. Two drug tests were collected today. Group members were engaged and shared openly throughout the session.   Summary: the patient was attentive and provided supportive feedback to her fellow group member who had attempted to score drugs last night. She rteminded him about [facility] and noted, "I am powerless over pills". She reminded him that 'it takes a lot to be humble'. The patient reported she had gotten home late yesterday because her ride had not come to  pick her up until 6 pm. During process, the patient questioned the member who had returned after a week's absence. She pointed out the patient had only shared about her use, "because you got caught". This generated a rather intensive conversation between the two that proved very emotional and left other members reserved and reluctant to participate. The patient eventually relented and suggested that she had never meant to hurt her fellow group member and was sorry if she did. Their engagement was actually beneficial for the entire group in seeing how a disagreement can occur and yet been resolved with violence or threats. The patient was attentive and provided helpful feedback in the psycho-ed. She responded well to this intervention.   UDS collected: No Results:   AA/NA attended?: No  Sponsor?: Yes   Brandon Melnick, LCAS 09/28/2017 10:35 AM

## 2017-09-29 ENCOUNTER — Other Ambulatory Visit (HOSPITAL_COMMUNITY): Payer: Medicare HMO | Admitting: Psychology

## 2017-09-29 DIAGNOSIS — F1020 Alcohol dependence, uncomplicated: Secondary | ICD-10-CM

## 2017-09-29 DIAGNOSIS — F332 Major depressive disorder, recurrent severe without psychotic features: Secondary | ICD-10-CM

## 2017-09-29 DIAGNOSIS — F1120 Opioid dependence, uncomplicated: Secondary | ICD-10-CM | POA: Diagnosis not present

## 2017-09-30 ENCOUNTER — Encounter (HOSPITAL_COMMUNITY): Payer: Self-pay | Admitting: Licensed Clinical Social Worker

## 2017-09-30 ENCOUNTER — Other Ambulatory Visit (HOSPITAL_COMMUNITY): Payer: Medicare HMO | Admitting: Psychology

## 2017-09-30 DIAGNOSIS — F1020 Alcohol dependence, uncomplicated: Secondary | ICD-10-CM

## 2017-09-30 DIAGNOSIS — F1120 Opioid dependence, uncomplicated: Secondary | ICD-10-CM

## 2017-09-30 NOTE — Progress Notes (Signed)
CD-IOP INDIVIDUAL SESSION  THERAPIST PROGRESS NOTE  Session Time: 12-1  Participation Level: Active  Behavioral Response: Neat and Well GroomedAlertEuthymic  Type of Therapy: Individual Therapy  Treatment Goals addressed: Diagnosis: SUD  Interventions: CBT, Assertiveness Training and Supportive  Summary: Sherry [name] is a 62 y.o. female who presents with hx of trauma and SUD, opiates and ETOH. She reports she slept in late this morning and was struggling to "get up and go" though she appears and well groomed. She reports she has not attended any AA/NA meetings since Monday's group. She states she is currently working on her goals of getting more doctors appointments arranged, including a new OBGYN. She is still in need of a new Kidney specialist bc she states she has been told she has minor to moderate kidney issues.   Counselor spends time helping pt gain insight into her past week's challenges, emotional struggles, and negative thinking. Counselor asks pt "what does she need to do for herself in the next month of tx to ensure success going forward?" Pt replies, "She has to learn to get honest everyday no matter how uncomfortable". Pt discusses her recent hx of becoming overly dependent on Gabapentin. This resulted in her lying to her sponsor frequently about her sobriety. Counselor helped pt focus her thoughts and verbalizations towards herself, since she has tendency to emphasize others' experience (group members, NA friends).   Pt stated she feels "dissapointed and unsure of herself sometimes." Pt then discusses her relationship to her preacher cousin who is not endorsing of pt's homosexuality. Counselor encourages pt to think about "what pt wants and believes about herself rather than looking to others for how to define herself". Pt tentatively agrees and appears skeptical. Counselor encourages pt to "try to talk about grieving loss of addiction and her past relationship w/ a female s/o w/  the pastor today", since the chaplain would be appearing in group later that day. Pt stated this is a good idea and she will try her best.   Suicidal/Homicidal: Nowithout intent/plan  Therapist Response: Counselor used supportive, open questions, reflection, and  person-centered tx planning. Counselor used CBT to challenge negative thinking and call out irrational beliefs. Counselor gained info on pt's relationship hx, values, and goals/motivation for moving forward.  Plan: Return again in 1 weeks.  Diagnosis:    ICD-10-CM   1. Alcohol use disorder, severe, dependence ([place]) F10.20   2. Opioid use disorder, severe, dependence ([place]) F11.20        Archie Balboa, LCAS-A 09/30/2017

## 2017-10-04 ENCOUNTER — Other Ambulatory Visit (HOSPITAL_COMMUNITY): Payer: Medicare HMO | Admitting: Psychology

## 2017-10-04 ENCOUNTER — Encounter (HOSPITAL_COMMUNITY): Payer: Self-pay | Admitting: Psychology

## 2017-10-04 DIAGNOSIS — F1120 Opioid dependence, uncomplicated: Secondary | ICD-10-CM

## 2017-10-04 DIAGNOSIS — F1020 Alcohol dependence, uncomplicated: Secondary | ICD-10-CM

## 2017-10-04 NOTE — Progress Notes (Signed)
    Daily Group Progress Note  Program: CD-IOP   10/04/2017 Sherry [name] [id number]  Diagnosis: F11.20 Opioid Use Disorder, Severe F10.20 Alcohol Use Disorder, Severe  Sobriety Date: 08/11/17  Group Time: 1-2:30pm  Participation Level: Active  Behavioral Response: Sharing  Type of Therapy: Process Group  Interventions: Supportive  Topic: Process: The firsts half of group was spent in process. Members shared about the things they had done, relative to their recovery, since we last met. one member shared about having been triggered more than ever since she began the program. The patient remained sober and challenged her thinking. The medical director met with three group members today.   Group Time: 2:30-4pm  Participation Level: Active  Behavioral Response: Appropriate and Sharing  Type of Therapy: Psycho-education Group  Interventions: Other: Grief and Loss  Topic: Psycho-Education: Agricultural consultant; Chaplain. The second half of group today was spent with a guest from the [place]. Sherry [name] discussed the topic of expressing one's feelings, both pain and joy. However, her focus was on expressing one's grief and loss. The group responded enthusiastically and shared about their own disappointments and pain. The session allowed some members to share about their losses in a depth or intensity that had not previously been known.  Summary: The patient provided supportive feedback to a fellow group member who was expressing frustration at the speed her family is changing. "It takes time", she reminded her. The patient reported she had been tired and hurting when she got home Monday evening and spent most of the next day in bed nursing her sore back. She admitted she didn't want to come to group today, but a friend had stopped by and encouraged her to go. The patient reported she had met with Sherry [name], her individual counselor, for their weekly session before group  today and it had been a 'great session'. She was very pleased with what they had discussed and seemed energized by their time together. In the psycho-ed, the patient shared that music and songs had a powerful effect on her. She could especially feel sadness when she heard a sad song. The patient made some good comments and responded well to this intervention.    UDS collected: No Results:  AA/NA attended?: No  Sponsor?: Yes   Sherry [name], LCAS 10/04/2017 8:34 AM

## 2017-10-04 NOTE — Progress Notes (Signed)
    Daily Group Progress Note  Program: CD-IOP   10/04/2017 Sherry [name] [id number]  Diagnosis:  Opioid Use disorder, severe, Alcohol Use Disorder, severe  Sobriety Date: 08/11/17  Group Time: 1-2:30pm  Participation Level: Active  Behavioral Response: Appropriate  Type of Therapy: Process Group  Interventions: Supportive  Topic: Patients were active and engaged in process session.  Patients shared accomplishments and challenges with relationships and recovery.  Patients shared about their reactions to the discussion with the Chaplain from the previous day and what they had been thinking about after that conversation.  The group celebrated the graduation of a group member.  Group Time: 2:30-4pm  Participation Level: Active  Behavioral Response: Sharing  Type of Therapy: Psycho-education Group  Interventions: CBT  Topic: Counselors led psychoeducation session, including education and discussion, around the topic of "emotional buttons". These refer to specific issues that patients are particularly sensitive towards, such as abandonment, rejection, inadequacy, control, etc.  Patients identified which buttons were particularly strong for them, examples of their impact and ideas for how they may cope when their buttons are "pressed".  Patients responded well to psychoeducation and engaged in active discussion.  Summary: Patient was active and engaged in session.  Patient said she felt she "went too far" in the session yesterday, as she shared a lot of personal details about her childhood.  Patient shared that she found a photo of herself as a child and plans to speak words of caring and compassion to her child self, upon counselor's suggestion.  Patient said she went to an [place] meeting last night and didn't like it as much as NA.  Patient identified fear of abandonment and rejection as two or her primary "emotional buttons".  Patient said she often "puts up a wall" or strikes out at  someone before they can have the chance to hurt her.   UDS collected: No Results:   AA/NA attended?: YesWednesday  Sponsor?: Yes   Brandon Melnick, LCAS 10/04/2017 9:06 AM

## 2017-10-06 ENCOUNTER — Encounter (HOSPITAL_COMMUNITY): Payer: Self-pay | Admitting: Medical

## 2017-10-06 ENCOUNTER — Other Ambulatory Visit (INDEPENDENT_AMBULATORY_CARE_PROVIDER_SITE_OTHER): Payer: Medicare HMO | Admitting: Psychology

## 2017-10-06 ENCOUNTER — Encounter (HOSPITAL_COMMUNITY): Payer: Self-pay | Admitting: Psychology

## 2017-10-06 DIAGNOSIS — Z8659 Personal history of other mental and behavioral disorders: Secondary | ICD-10-CM

## 2017-10-06 DIAGNOSIS — T43505A Adverse effect of unspecified antipsychotics and neuroleptics, initial encounter: Secondary | ICD-10-CM

## 2017-10-06 DIAGNOSIS — G2401 Drug induced subacute dyskinesia: Secondary | ICD-10-CM

## 2017-10-06 DIAGNOSIS — F1120 Opioid dependence, uncomplicated: Secondary | ICD-10-CM | POA: Diagnosis not present

## 2017-10-06 DIAGNOSIS — N183 Chronic kidney disease, stage 3 unspecified: Secondary | ICD-10-CM

## 2017-10-06 DIAGNOSIS — F1020 Alcohol dependence, uncomplicated: Secondary | ICD-10-CM

## 2017-10-06 DIAGNOSIS — F1994 Other psychoactive substance use, unspecified with psychoactive substance-induced mood disorder: Secondary | ICD-10-CM

## 2017-10-06 DIAGNOSIS — I10 Essential (primary) hypertension: Secondary | ICD-10-CM

## 2017-10-06 DIAGNOSIS — Z9189 Other specified personal risk factors, not elsewhere classified: Secondary | ICD-10-CM

## 2017-10-06 DIAGNOSIS — F4312 Post-traumatic stress disorder, chronic: Secondary | ICD-10-CM

## 2017-10-06 DIAGNOSIS — Z62810 Personal history of physical and sexual abuse in childhood: Secondary | ICD-10-CM
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
## 2017-10-06 NOTE — Progress Notes (Signed)
    Daily Group Progress Note  Program: CD-IOP   10/06/2017 [name] [id number]  Diagnosis:  No diagnosis found.   Sobriety Date: 1/30  Group Time: 1-2:30  Participation Level: Active  Behavioral Response: Appropriate and Sharing  Type of Therapy: Process Group  Interventions: CBT  Topic: Patients were active and engaged in process session. Counselors led pt's in discussion concerning their recovery from mind-altering drugs. Emphasis on goals from tx plan and thoughts and feelings processing. One new pt was present and shared openly abou this alcoholism and need for tx. This pt also met w/ medical director. UDS samples were collected from some members. Behavioral contracts were signed by 2 members who have not attended any AA/NA meetings since entering program over 3 sessions ago.      Group Time: 2:30-4  Participation Level: Active  Behavioral Response: Appropriate and Sharing  Type of Therapy: Psycho-education Group  Interventions: CBT, Psychosocial Skills: Resentment and Forgiveness and Anger Management Training  Topic: Patients were active and engaged in group psychoeducation session. Counselor led 1.5 hour group on resentment, forgivenss, and mindfulness. A resentment handout was distributed and pts were encouraged to think of a "small resentment w/ which to practice the skills of letting go of resentment". Pts were attentive and shared openly during session. Counselor worked to help pts "get to know their styles of resentments and how they impact their functioning".     Summary: Pt was active and engaged in group. Attended 2 NA meetings. Endorsed feeling tired and still somewhat down and depressed. Pt became agitated w/ her cousin during group who she feels is "preaching right at her in church". Pt lacks insight into options for pursuing alternative faith community and feels obligated to "not go against her family". Reports she "got info on NA-Anon meeting"  which she hopes to attend soon. Pt is growing her social network by befriending an College member, who she has historically distanced herself from. UDS result collected.    UDS collected: Yes Results: pending  AA/NA attended?: YesSaturday and Sunday  Sponsor?: Yes   [name], LPCA LCASA 10/06/2017 1:53 PM

## 2017-10-07 ENCOUNTER — Encounter (HOSPITAL_COMMUNITY): Payer: Self-pay | Admitting: Medical

## 2017-10-07 ENCOUNTER — Other Ambulatory Visit (HOSPITAL_COMMUNITY): Payer: Medicare HMO
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
## 2017-10-11 ENCOUNTER — Other Ambulatory Visit (HOSPITAL_COMMUNITY): Payer: Medicare HMO | Attending: Psychiatry | Admitting: Psychology

## 2017-10-11 DIAGNOSIS — F1994 Other psychoactive substance use, unspecified with psychoactive substance-induced mood disorder: Secondary | ICD-10-CM | POA: Diagnosis present

## 2017-10-11 DIAGNOSIS — F1020 Alcohol dependence, uncomplicated: Secondary | ICD-10-CM

## 2017-10-11 DIAGNOSIS — F4312 Post-traumatic stress disorder, chronic: Secondary | ICD-10-CM

## 2017-10-11 DIAGNOSIS — F1120 Opioid dependence, uncomplicated: Secondary | ICD-10-CM

## 2017-10-12 ENCOUNTER — Encounter (HOSPITAL_COMMUNITY): Payer: Self-pay | Admitting: Psychology

## 2017-10-12 NOTE — Progress Notes (Signed)
    Daily Group Progress Note  Program: CD-IOP   10/12/2017 [name] [id number]  Diagnosis:  Opioid use disorder, severe, dependence (HCC)  Alcohol use disorder, severe, dependence (HCC)  Chronic post-traumatic stress disorder (PTSD)   Sobriety Date: 1/30  Group Time: 1-2:30  Participation Level: Active  Behavioral Response: Appropriate and Sharing  Type of Therapy: Process Group  Interventions: CBT, Strength-based, Assertiveness Training and Supportive  Topic: Patients were active and engaged in process session. Counselors led pt's in discussion concerning their recovery from mind-altering drugs. Emphasis on goals from tx plan and thoughts and feelings processing. UDS samples were collected from some members. One returning member was back after missing a group due to serving some jail time. One member graduated successfully and was able to listen to group feedback w/o using humor as a defense mechanism for avoiding feelings of self-worth.      Group Time: 2:30-4  Participation Level: Active  Behavioral Response: Appropriate, Sharing and Agitated  Type of Therapy: Psycho-education Group  Interventions: Other: Chair Yoga  Topic: Patients were active and engaged in group psychoeducation session. A guest counselor, Jan Fireman [facility] was present and led 1.5 hr Chair Yoga routine for increased peace and mindfulness. Pts were attentive and shared openly during session. Pts were invited to share about their experience of the yoga routine w/ the group.     Summary: Pt was active and engaged in session. She reported attending 2 NA meetings over weekend. She stated that her church "told her that yoga was demonic" but pt then stated "I think my church members are wrong about this" She participated actively in yoga, though she struggled to maintain a standing position throughout exercise. She participated actively from a seated position for last 30 min. Pt shared that she  attended a church service over the weekend "in which people were overcome by the [facility] of [employer]" and this was "very alarming for her". Pt reports she is gaining insight into how she has "begun wearing her emotions on her sleeve the past 2 weeks" for unknown reasons. Pt shared she is recognizing how sensitive she is to other people's comments about her. She stated she did not "feel excited about coming to group today and chose to listen to [place] to pump her up and motivate her to come". Counselor reflected on the resourcefulness of this skill. Pt shared she is gaining insight into her resentment towards other church members and is still trying to decide how to proceed. Pt was tearful during graduation and commented extensively on graduate's hard work and change through group process.   UDS collected: Yes Results: pending  AA/NA attended?: YesFriday and Saturday  Sponsor?: Yes   [name], LPCA LCASA 10/12/2017 2:21 PM

## 2017-10-13 ENCOUNTER — Other Ambulatory Visit (HOSPITAL_COMMUNITY): Payer: Medicare HMO | Admitting: Psychology

## 2017-10-13 DIAGNOSIS — F4312 Post-traumatic stress disorder, chronic: Secondary | ICD-10-CM

## 2017-10-13 DIAGNOSIS — Z62810 Personal history of physical and sexual abuse in childhood: Secondary | ICD-10-CM

## 2017-10-13 DIAGNOSIS — F1120 Opioid dependence, uncomplicated: Secondary | ICD-10-CM

## 2017-10-13 DIAGNOSIS — F1020 Alcohol dependence, uncomplicated: Secondary | ICD-10-CM

## 2017-10-14 ENCOUNTER — Encounter (HOSPITAL_COMMUNITY): Payer: Self-pay | Admitting: Psychology

## 2017-10-14 ENCOUNTER — Other Ambulatory Visit (HOSPITAL_COMMUNITY): Payer: Medicare HMO | Admitting: Psychology

## 2017-10-14 DIAGNOSIS — F1120 Opioid dependence, uncomplicated: Secondary | ICD-10-CM

## 2017-10-14 DIAGNOSIS — F1020 Alcohol dependence, uncomplicated: Secondary | ICD-10-CM
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
## 2017-10-18 ENCOUNTER — Other Ambulatory Visit (HOSPITAL_COMMUNITY): Payer: Medicare HMO | Admitting: Psychology

## 2017-10-18 DIAGNOSIS — F1120 Opioid dependence, uncomplicated: Secondary | ICD-10-CM

## 2017-10-18 DIAGNOSIS — F4312 Post-traumatic stress disorder, chronic: Secondary | ICD-10-CM

## 2017-10-20 ENCOUNTER — Encounter (HOSPITAL_COMMUNITY): Payer: Self-pay | Admitting: Psychology

## 2017-10-20 ENCOUNTER — Other Ambulatory Visit (HOSPITAL_COMMUNITY): Payer: Medicare HMO | Admitting: Psychology

## 2017-10-20 DIAGNOSIS — Z62810 Personal history of physical and sexual abuse in childhood: Secondary | ICD-10-CM

## 2017-10-20 DIAGNOSIS — F1020 Alcohol dependence, uncomplicated: Secondary | ICD-10-CM

## 2017-10-20 DIAGNOSIS — F1120 Opioid dependence, uncomplicated: Secondary | ICD-10-CM

## 2017-10-20 NOTE — Progress Notes (Signed)
    Daily Group Progress Note  Program: CD-IOP   10/20/2017 Sherry [name] [id number]  Diagnosis:  Opioid use disorder, severe, dependence (HCC)  Chronic post-traumatic stress disorder (PTSD)   Sobriety Date: 1/30  Group Time: 1-2:30  Participation Level: Active  Behavioral Response: Appropriate and Sharing  Type of Therapy: Process Group  Interventions: CBT, Strength-based and Supportive  Topic: Patients were active and engaged in process session. Counselors led pt's in discussion concerning their recovery from mind-altering drugs. Emphasis on goals from tx plan and thoughts and feelings processing. UDS samples were collected from some members. 3 Members were missing w/o contacting program. 1 member had told program she would not be in attendance due to a prescheduled move. One pt met w/ program Market researcher.      Group Time: 2:30-4  Participation Level: Active  Behavioral Response: Appropriate and Sharing  Type of Therapy: Psycho-education Group  Interventions: Family Systems and Other: "I Am From" poem writing  Topic: Patients were active and engaged in group psychoeducation session. Counselors led a creative exercise aimed at increasing pt's familial of origin awareness using the "Where I'm From" poem model. Pts were given handout to fillout w/ their own responses and read their personal poems aloud for group. Pts reported the exercise was enlingthening and "helped them make connections about how their family rules had impacted their addiction".     Summary: Pt was active and engaged in session. She was mostly quiet during first half of group (process session) and elected to check-in last. Pt disclosed her sponsor had encouraged her to "listen more and talk less" and pt was trying to practice this. The group encouraged her on this topic. She reported she continues to listen to gospel music for encouragement and does laundry in mornings before group. Pt got her  hair done and went out to eat on Friday after meeting. Pt continues to struggle w/ securing transportation and states that "people frequently cancel on her". Pt disclosed she is realizing she struggles w/ hearing "other people talk about their problems w/o warning or instructing them" bc pt would blame herself. Pt reported she texted her ex-girlfriend on her birthday this weekend "did not get the response she was looking for". Pt struggled at first to complete "I Am From" poem writing assignment since she believes she "blocked out much of her childhood". Pt was able to complete poem and had both sad and happy emotions about what she wrote.    UDS collected: Yes Results: negative  AA/NA attended?: YesFriday  Sponsor?: Yes   [name], LPCA LCASA 10/20/2017 3:38 PM

## 2017-10-21 ENCOUNTER — Inpatient Hospital Stay (HOSPITAL_COMMUNITY)
Admission: EM | Admit: 2017-10-21 | Discharge: 2017-10-24 | DRG: 304 | Disposition: A | Discharge status: Home / Self Care | Payer: Medicare HMO | Attending: Internal Medicine | Admitting: Internal Medicine

## 2017-10-21 ENCOUNTER — Other Ambulatory Visit (HOSPITAL_COMMUNITY): Payer: Medicare HMO

## 2017-10-21 ENCOUNTER — Encounter (HOSPITAL_COMMUNITY): Payer: Self-pay

## 2017-10-21 ENCOUNTER — Emergency Department (HOSPITAL_COMMUNITY): Payer: Medicare HMO

## 2017-10-21 DIAGNOSIS — K760 Fatty (change of) liver, not elsewhere classified: Secondary | ICD-10-CM | POA: Diagnosis present

## 2017-10-21 DIAGNOSIS — Z90710 Acquired absence of both cervix and uterus: Secondary | ICD-10-CM

## 2017-10-21 DIAGNOSIS — D631 Anemia in chronic kidney disease: Secondary | ICD-10-CM | POA: Diagnosis present

## 2017-10-21 DIAGNOSIS — N183 Chronic kidney disease, stage 3 unspecified: Secondary | ICD-10-CM | POA: Diagnosis present

## 2017-10-21 DIAGNOSIS — Z6841 Body Mass Index (BMI) 40.0 and over, adult: Secondary | ICD-10-CM

## 2017-10-21 DIAGNOSIS — Z881 Allergy status to other antibiotic agents status: Secondary | ICD-10-CM

## 2017-10-21 DIAGNOSIS — G2401 Drug induced subacute dyskinesia: Secondary | ICD-10-CM | POA: Diagnosis present

## 2017-10-21 DIAGNOSIS — F319 Bipolar disorder, unspecified: Secondary | ICD-10-CM | POA: Diagnosis present

## 2017-10-21 DIAGNOSIS — Z885 Allergy status to narcotic agent status: Secondary | ICD-10-CM

## 2017-10-21 DIAGNOSIS — Z79899 Other long term (current) drug therapy: Secondary | ICD-10-CM

## 2017-10-21 DIAGNOSIS — Z8619 Personal history of other infectious and parasitic diseases: Secondary | ICD-10-CM | POA: Diagnosis present

## 2017-10-21 DIAGNOSIS — I5031 Acute diastolic (congestive) heart failure: Secondary | ICD-10-CM | POA: Diagnosis present

## 2017-10-21 DIAGNOSIS — Z888 Allergy status to other drugs, medicaments and biological substances status: Secondary | ICD-10-CM

## 2017-10-21 DIAGNOSIS — I130 Hypertensive heart and chronic kidney disease with heart failure and stage 1 through stage 4 chronic kidney disease, or unspecified chronic kidney disease: Secondary | ICD-10-CM | POA: Diagnosis present

## 2017-10-21 DIAGNOSIS — G894 Chronic pain syndrome: Secondary | ICD-10-CM | POA: Diagnosis present

## 2017-10-21 DIAGNOSIS — Z716 Tobacco abuse counseling: Secondary | ICD-10-CM

## 2017-10-21 DIAGNOSIS — N179 Acute kidney failure, unspecified: Secondary | ICD-10-CM | POA: Diagnosis present

## 2017-10-21 DIAGNOSIS — F17210 Nicotine dependence, cigarettes, uncomplicated: Secondary | ICD-10-CM | POA: Diagnosis present

## 2017-10-21 DIAGNOSIS — D509 Iron deficiency anemia, unspecified: Secondary | ICD-10-CM

## 2017-10-21 DIAGNOSIS — G4440 Drug-induced headache, not elsewhere classified, not intractable: Secondary | ICD-10-CM | POA: Diagnosis not present

## 2017-10-21 DIAGNOSIS — M545 Low back pain: Secondary | ICD-10-CM | POA: Diagnosis present

## 2017-10-21 DIAGNOSIS — Y92230 Patient room in hospital as the place of occurrence of the external cause: Secondary | ICD-10-CM | POA: Diagnosis not present

## 2017-10-21 DIAGNOSIS — G8929 Other chronic pain: Secondary | ICD-10-CM | POA: Diagnosis present

## 2017-10-21 DIAGNOSIS — Z853 Personal history of malignant neoplasm of breast: Secondary | ICD-10-CM

## 2017-10-21 DIAGNOSIS — I10 Essential (primary) hypertension: Secondary | ICD-10-CM

## 2017-10-21 DIAGNOSIS — T463X5A Adverse effect of coronary vasodilators, initial encounter: Secondary | ICD-10-CM | POA: Diagnosis not present

## 2017-10-21 DIAGNOSIS — R0602 Shortness of breath: Secondary | ICD-10-CM | POA: Diagnosis present

## 2017-10-21 DIAGNOSIS — Z9049 Acquired absence of other specified parts of digestive tract: Secondary | ICD-10-CM | POA: Diagnosis not present

## 2017-10-21 DIAGNOSIS — I509 Heart failure, unspecified: Secondary | ICD-10-CM | POA: Diagnosis not present

## 2017-10-21 DIAGNOSIS — I161 Hypertensive emergency: Principal | ICD-10-CM | POA: Diagnosis present

## 2017-10-21 DIAGNOSIS — T43505A Adverse effect of unspecified antipsychotics and neuroleptics, initial encounter: Secondary | ICD-10-CM

## 2017-10-21 DIAGNOSIS — Z86010 Personal history of colonic polyps: Secondary | ICD-10-CM

## 2017-10-21 HISTORY — DX: Hypertensive emergency: I16.1

## 2017-10-21 HISTORY — DX: Iron deficiency anemia, unspecified: D50.9

## 2017-10-21 LAB — RETICULOCYTES
RBC.: 3.49 MIL/uL — ABNORMAL LOW (ref 3.87–5.11)
Retic Count, Absolute: 73.3 10*3/uL (ref 19.0–186.0)
Retic Ct Pct: 2.1 % (ref 0.4–3.1)

## 2017-10-21 LAB — CBC WITH DIFFERENTIAL/PLATELET
Basophils Absolute: 0 10*3/uL (ref 0.0–0.1)
Basophils Relative: 0 %
Eosinophils Absolute: 0.2 10*3/uL (ref 0.0–0.7)
Eosinophils Relative: 3 %
HCT: 25.4 % — ABNORMAL LOW (ref 36.0–46.0)
Hemoglobin: 7.4 g/dL — ABNORMAL LOW (ref 12.0–15.0)
Lymphocytes Relative: 24 %
Lymphs Abs: 1.8 10*3/uL (ref 0.7–4.0)
MCH: 22.4 pg — ABNORMAL LOW (ref 26.0–34.0)
MCHC: 29.1 g/dL — ABNORMAL LOW (ref 30.0–36.0)
MCV: 77 fL — ABNORMAL LOW (ref 78.0–100.0)
Monocytes Absolute: 0.5 10*3/uL (ref 0.1–1.0)
Monocytes Relative: 7 %
Neutro Abs: 5 10*3/uL (ref 1.7–7.7)
Neutrophils Relative %: 66 %
Platelets: 329 10*3/uL (ref 150–400)
RBC: 3.3 MIL/uL — ABNORMAL LOW (ref 3.87–5.11)
RDW: 17.5 % — ABNORMAL HIGH (ref 11.5–15.5)
WBC: 7.6 10*3/uL (ref 4.0–10.5)

## 2017-10-21 LAB — BRAIN NATRIURETIC PEPTIDE: B Natriuretic Peptide: 355.4 pg/mL — ABNORMAL HIGH (ref 0.0–100.0)

## 2017-10-21 LAB — FOLATE: Folate: 9.6 ng/mL (ref >5.9)

## 2017-10-21 LAB — ABO/RH: ABO/RH(D): O POS

## 2017-10-21 LAB — I-STAT TROPONIN, ED: Troponin i, poc: 0.01 ng/mL (ref 0.00–0.08)

## 2017-10-21 LAB — COMPREHENSIVE METABOLIC PANEL
ALT: 11 U/L — ABNORMAL LOW (ref 14–54)
AST: 17 U/L (ref 15–41)
Albumin: 3.1 g/dL — ABNORMAL LOW (ref 3.5–5.0)
Alkaline Phosphatase: 90 U/L (ref 38–126)
Anion gap: 9 (ref 5–15)
BUN: 24 mg/dL — ABNORMAL HIGH (ref 6–20)
CO2: 27 mmol/L (ref 22–32)
Calcium: 9.1 mg/dL (ref 8.9–10.3)
Chloride: 108 mmol/L (ref 101–111)
Creatinine, Ser: 1.65 mg/dL — ABNORMAL HIGH (ref 0.44–1.00)
GFR calc Af Amer: 38 mL/min — ABNORMAL LOW (ref >60)
GFR calc non Af Amer: 33 mL/min — ABNORMAL LOW (ref >60)
Glucose, Bld: 92 mg/dL (ref 65–99)
Potassium: 4.5 mmol/L (ref 3.5–5.1)
Sodium: 144 mmol/L (ref 135–145)
Total Bilirubin: 0.3 mg/dL (ref 0.3–1.2)
Total Protein: 7.3 g/dL (ref 6.5–8.1)

## 2017-10-21 LAB — PREPARE RBC (CROSSMATCH)

## 2017-10-21 LAB — POC OCCULT BLOOD, ED: Fecal Occult Bld: NEGATIVE

## 2017-10-21 LAB — TSH: TSH: 0.882 u[IU]/mL (ref 0.350–4.500)

## 2017-10-21 MED ORDER — LORAZEPAM 1 MG PO TABS
1.0000 mg | ORAL_TABLET | Freq: Once | ORAL | Status: AC
Start: 2017-10-21 — End: 2017-10-21
  Administered 2017-10-21: 1 mg via ORAL
  Filled 2017-10-21: qty 1

## 2017-10-21 MED ORDER — LABETALOL HCL 200 MG PO TABS
200.0000 mg | ORAL_TABLET | Freq: Two times a day (BID) | ORAL | Status: DC
  Administered 2017-10-21 – 2017-10-22 (×2): 200 mg via ORAL
  Filled 2017-10-21 (×2): qty 1

## 2017-10-21 MED ORDER — FUROSEMIDE 10 MG/ML IJ SOLN
40.0000 mg | Freq: Once | INTRAMUSCULAR | Status: AC
  Administered 2017-10-21: 40 mg via INTRAVENOUS
  Filled 2017-10-21: qty 4

## 2017-10-21 MED ORDER — MIRTAZAPINE 15 MG PO TABS
15.0000 mg | ORAL_TABLET | Freq: Every day | ORAL | Status: DC
  Administered 2017-10-21: 15 mg via ORAL
  Filled 2017-10-21 (×2): qty 1

## 2017-10-21 MED ORDER — ACETAMINOPHEN 325 MG PO TABS: 650.0000 mg | ORAL_TABLET | Freq: Once | ORAL | Status: DC

## 2017-10-21 MED ORDER — NITROGLYCERIN IN D5W 200-5 MCG/ML-% IV SOLN
5.0000 ug/min | Freq: Once | INTRAVENOUS | Status: AC
  Administered 2017-10-21: 5 ug/min via INTRAVENOUS
  Filled 2017-10-21: qty 250

## 2017-10-21 MED ORDER — SODIUM CHLORIDE 0.9 % IV SOLN: Freq: Once | INTRAVENOUS | Status: DC

## 2017-10-21 MED ORDER — SODIUM CHLORIDE 0.9 % IV SOLN
250.0000 mL | INTRAVENOUS | Status: DC | PRN
  Administered 2017-10-23: 500 mL via INTRAVENOUS

## 2017-10-21 MED ORDER — ISOSORBIDE MONONITRATE ER 30 MG PO TB24
30.0000 mg | ORAL_TABLET | Freq: Every day | ORAL | Status: DC
  Administered 2017-10-21 – 2017-10-22 (×2): 30 mg via ORAL
  Filled 2017-10-21 (×3): qty 1

## 2017-10-21 MED ORDER — NICOTINE 21 MG/24HR TD PT24
21.0000 mg | MEDICATED_PATCH | Freq: Every day | TRANSDERMAL | Status: DC
  Administered 2017-10-22 – 2017-10-24 (×3): 21 mg via TRANSDERMAL
  Filled 2017-10-21 (×3): qty 1

## 2017-10-21 MED ORDER — GABAPENTIN 400 MG PO CAPS
400.0000 mg | ORAL_CAPSULE | Freq: Once | ORAL | Status: AC
  Administered 2017-10-21: 400 mg via ORAL
  Filled 2017-10-21: qty 1

## 2017-10-21 MED ORDER — DIPHENHYDRAMINE HCL 50 MG/ML IJ SOLN: 25.0000 mg | Freq: Once | INTRAMUSCULAR | Status: DC

## 2017-10-21 MED ORDER — ACETAMINOPHEN 325 MG PO TABS
650.0000 mg | ORAL_TABLET | ORAL | Status: DC | PRN
  Administered 2017-10-22: 650 mg via ORAL
  Filled 2017-10-21: qty 2

## 2017-10-21 MED ORDER — SODIUM CHLORIDE 0.9% FLUSH: 3.0000 mL | INTRAVENOUS | Status: DC | PRN

## 2017-10-21 MED ORDER — DARIFENACIN HYDROBROMIDE ER 15 MG PO TB24
15.0000 mg | ORAL_TABLET | Freq: Every day | ORAL | Status: DC
  Administered 2017-10-22 – 2017-10-24 (×3): 15 mg via ORAL
  Filled 2017-10-21 (×3): qty 1

## 2017-10-21 MED ORDER — SODIUM CHLORIDE 0.9% FLUSH
3.0000 mL | Freq: Two times a day (BID) | INTRAVENOUS | Status: DC
  Administered 2017-10-21 – 2017-10-23 (×5): 3 mL via INTRAVENOUS

## 2017-10-21 MED ORDER — FUROSEMIDE 10 MG/ML IJ SOLN
80.0000 mg | Freq: Three times a day (TID) | INTRAMUSCULAR | Status: DC
  Administered 2017-10-21 – 2017-10-22 (×2): 80 mg via INTRAVENOUS
  Filled 2017-10-21 (×3): qty 8

## 2017-10-21 MED ORDER — GABAPENTIN 400 MG PO CAPS
400.0000 mg | ORAL_CAPSULE | Freq: Three times a day (TID) | ORAL | Status: DC
  Administered 2017-10-21 – 2017-10-24 (×8): 400 mg via ORAL
  Filled 2017-10-21 (×8): qty 1

## 2017-10-21 MED ORDER — ONDANSETRON HCL 4 MG/2ML IJ SOLN: 4.0000 mg | Freq: Four times a day (QID) | INTRAMUSCULAR | Status: DC | PRN

## 2017-10-21 MED ORDER — HYDRALAZINE HCL 25 MG PO TABS
25.0000 mg | ORAL_TABLET | Freq: Three times a day (TID) | ORAL | Status: DC
  Administered 2017-10-21 – 2017-10-24 (×9): 25 mg via ORAL
  Filled 2017-10-21 (×9): qty 1

## 2017-10-21 MED ORDER — DIVALPROEX SODIUM ER 250 MG PO TB24
500.0000 mg | ORAL_TABLET | Freq: Every day | ORAL | Status: DC
  Administered 2017-10-21 – 2017-10-23 (×3): 500 mg via ORAL
  Filled 2017-10-21 (×3): qty 2

## 2017-10-21 MED ORDER — NITROGLYCERIN IN D5W 200-5 MCG/ML-% IV SOLN
0.0000 ug/min | INTRAVENOUS | Status: DC
  Administered 2017-10-21: 45 ug/min via INTRAVENOUS
  Administered 2017-10-22: 80 ug/min via INTRAVENOUS
  Filled 2017-10-21 (×2): qty 250

## 2017-10-21 MED ORDER — ALBUTEROL SULFATE (2.5 MG/3ML) 0.083% IN NEBU
5.0000 mg | INHALATION_SOLUTION | Freq: Once | RESPIRATORY_TRACT | Status: AC
  Administered 2017-10-21: 5 mg via RESPIRATORY_TRACT
  Filled 2017-10-21: qty 6

## 2017-10-21 MED ORDER — LABETALOL HCL 5 MG/ML IV SOLN
20.0000 mg | Freq: Once | INTRAVENOUS | Status: AC
  Administered 2017-10-21: 20 mg via INTRAVENOUS
  Filled 2017-10-21: qty 4

## 2017-10-21 NOTE — ED Provider Notes (Signed)
[facility] DEPT Provider Note   CSN: [id number] Arrival date & time: 10/21/17  1220     History   Chief Complaint Chief Complaint  Patient presents with  . Shortness of Breath    HPI Sherry [name] is a 62 y.o. female.  Patient complains of shortness of breath especially when she lies flat  The history is provided by the patient. No language interpreter was used.  Shortness of Breath  This is a new problem. The problem occurs continuously.The current episode started more than 2 days ago. The problem has not changed since onset.Pertinent negatives include no fever, no headaches, no cough, no chest pain, no abdominal pain and no rash. It is unknown what precipitated the problem. She has tried nothing for the symptoms. The treatment provided no relief. She has had prior hospitalizations. She has had prior ED visits. She has had no prior ICU admissions.    Past Medical History:  Diagnosis Date  . Cancer ([place])   . Chronic back pain   . Depression (emotion)   . Hepatitis C   . Hypertension   . Opioid dependence ([place])    in [place]    Patient Active Problem List   Diagnosis Date Noted  . Medication intolerance 09/06/2017  . Neuroleptic-induced tardive dyskinesia 09/06/2017  . Cancer ([place]) 09/06/2017  . Chronic low back pain 09/06/2017  . Grief reaction with prolonged bereavement 09/06/2017  . Opioid use disorder, severe, dependence ([place]) 08/27/2017  . Alcohol use disorder, severe, dependence ([place]) 08/27/2017  . Substance induced mood disorder ([place]) 08/13/2017  . AKI (acute kidney injury) ([place])   . MDD (major depressive disorder), recurrent severe, without psychosis ([place])   . Alcohol withdrawal ([place]) 08/09/2017  . Essential hypertension 07/06/2017  . Chronic kidney disease, stage III (moderate) ([place]) 11/17/2016  . Anxiety 08/21/2016  . Hx of bipolar disorder 01/31/2016  . History of breast cancer 02/13/2013  . Morbid obesity ([place])  02/13/2013  . Hepatitis C virus infection cured after antiviral drug therapy 12/17/2012    Past Surgical History:  Procedure Laterality Date  . ABDOMINAL HYSTERECTOMY    . BACK SURGERY    . CHOLECYSTECTOMY    . MYOMECTOMY       OB History   None      Home Medications    Prior to Admission medications   Medication Sig Start Date End Date Taking? Authorizing Provider  Cyanocobalamin (VITAMIN B-12) 1000 MCG SUBL  07/21/17  Yes [provider]  divalproex (DEPAKOTE ER) 500 MG 24 hr tablet Take 1 tablet (500 mg total) by mouth at bedtime. For mood control 09/06/17 10/21/17 Yes Dara Hoyer, PA-C  gabapentin (NEURONTIN) 400 MG capsule Take 1 capsule (400 mg total) by mouth 3 (three) times daily. For pain and withdrawal symptoms 09/06/17  Yes Dara Hoyer, PA-C  hydrochlorothiazide (HYDRODIURIL) 50 MG tablet Take 1 tablet (50 mg total) by mouth daily. For high blood pressure 08/25/17  Yes Money, Lowry Ram, FNP  hydrOXYzine (ATARAX/VISTARIL) 25 MG tablet Take 1 tablet (25 mg total) by mouth every 6 (six) hours as needed for anxiety. 09/15/17  Yes Darlyne Russian E, PA-C  INGREZZA 80 MG CAPS Take 80 mg by mouth daily. 10/15/17  Yes [provider]  labetalol (NORMODYNE) 200 MG tablet Take 1 tablet (200 mg total) by mouth 2 (two) times daily. For high blood pressure 09/06/17  Yes Dara Hoyer, PA-C  mirtazapine (REMERON) 15 MG tablet Take 15 mg by mouth  at bedtime. 10/06/17  Yes [provider]  Multiple Vitamins-Minerals (ADULT GUMMY) CHEW Chew 1 tablet by mouth daily. 07/21/17  Yes [provider]  NICODERM CQ 21 MG/24HR patch Apply 1 patch topically daily. 10/06/17  Yes [provider]  pantoprazole (PROTONIX) 20 MG tablet Take 1 tablet (20 mg total) by mouth 2 (two) times daily. 09/06/17  Yes Dara Hoyer, PA-C  solifenacin (VESICARE) 10 MG tablet Take 10 mg by mouth. 08/28/16  Yes [provider]  Alcohol Swabs (ALCOHOL PREP) PADS   07/21/17   [provider]  Incontinence Supply Disposable (BLADDER CONTROL PADS EX ABSORB) [place]  07/21/17   [provider]  Melatonin 5 MG LOZG Place 1 lozenge under the tongue at bedtime as needed and may repeat dose one time if needed. Patient not taking: Reported on 10/21/2017 09/06/17 12/05/17  Dara Hoyer, PA-C    Family History Family History  Family history unknown: Yes    Social History Social History   Tobacco Use  . Smoking status: Current Every Day Smoker    Packs/day: 1.00    Types: Cigarettes  . Smokeless tobacco: Never Used  Substance Use Topics  . Alcohol use: No  . Drug use: No     Allergies   Abilify [aripiprazole]; Remeron [mirtazapine]; Trazodone and nefazodone; Flexeril [cyclobenzaprine]; and Amoxicillin   Review of Systems Review of Systems  Constitutional: Negative for appetite change, fatigue and fever.  HENT: Negative for congestion, ear discharge and sinus pressure.   Eyes: Negative for discharge.  Respiratory: Positive for shortness of breath. Negative for cough.   Cardiovascular: Negative for chest pain.  Gastrointestinal: Negative for abdominal pain and diarrhea.  Genitourinary: Negative for frequency and hematuria.  Musculoskeletal: Negative for back pain.  Skin: Negative for rash.  Neurological: Negative for seizures and headaches.  Psychiatric/Behavioral: Negative for hallucinations.     Physical Exam Updated Vital Signs BP (!) 159/116   Pulse 90   Temp 97.9 F (36.6 C) (Oral)   Resp (!) 22   Ht 5\' 2"  (1.575 m)   Wt 112.4 kg (247 lb 11.2 oz)   SpO2 100%   BMI 45.30 kg/m   Physical Exam  Constitutional: She is oriented to person, place, and time. She appears well-developed.  HENT:  Head: Normocephalic.  Eyes: Conjunctivae and EOM are normal. No scleral icterus.  Neck: Neck supple. No thyromegaly present.  Cardiovascular: Normal rate and regular rhythm. Exam reveals no gallop and no friction rub.  No  murmur heard. Pulmonary/Chest: No stridor. She has no wheezes. She has no rales. She exhibits no tenderness.  Abdominal: She exhibits no distension. There is no tenderness. There is no rebound.  Genitourinary:  Genitourinary Comments: Heme positive brown stool  Musculoskeletal: Normal range of motion. She exhibits no edema.  Lymphadenopathy:    She has no cervical adenopathy.  Neurological: She is oriented to person, place, and time. She exhibits normal muscle tone. Coordination normal.  Skin: No rash noted. No erythema.  Psychiatric: She has a normal mood and affect. Her behavior is normal.     ED Treatments / Results  Labs (all labs ordered are listed, but only abnormal results are displayed) Labs Reviewed  CBC WITH DIFFERENTIAL/PLATELET - Abnormal; Notable for the following components:      Result Value   RBC 3.30 (*)    Hemoglobin 7.4 (*)    HCT 25.4 (*)    MCV 77.0 (*)    MCH 22.4 (*)  MCHC 29.1 (*)    RDW 17.5 (*)    All other components within normal limits  COMPREHENSIVE METABOLIC PANEL - Abnormal; Notable for the following components:   BUN 24 (*)    Creatinine, Ser 1.65 (*)    Albumin 3.1 (*)    ALT 11 (*)    GFR calc non Af Amer 33 (*)    GFR calc Af Amer 38 (*)    All other components within normal limits  BRAIN NATRIURETIC PEPTIDE - Abnormal; Notable for the following components:   B Natriuretic Peptide 355.4 (*)    All other components within normal limits  OCCULT BLOOD X 1 CARD TO LAB, STOOL  VITAMIN B12  FOLATE  IRON AND TIBC  FERRITIN  RETICULOCYTES  TSH  I-STAT TROPONIN, ED  POC OCCULT BLOOD, ED    EKG EKG Interpretation  Date/Time:  Thursday October 21 2017 12:35:24 EDT Ventricular Rate:  76 PR Interval:    QRS Duration: 76 QT Interval:  395 QTC Calculation: 445 R Axis:   35 Text Interpretation:  Sinus rhythm Probable left atrial enlargement Baseline wander in lead(s) I III aVL Confirmed by Milton Ferguson [phone]) on 10/21/2017 12:54:01  PM   Radiology Dg Chest 2 View  Result Date: 10/21/2017 CLINICAL DATA:  Shortness of breath and chest pain EXAM: CHEST - 2 VIEW COMPARISON:  July 27, 2017 FINDINGS: There is no edema or consolidation. Heart is borderline enlarged with pulmonary vascularity within normal limits. No adenopathy. There is postoperative change in the lower cervical region. IMPRESSION: Borderline cardiac enlargement.  No edema or consolidation. Electronically Signed   By: Lowella Grip III M.D.   On: 10/21/2017 13:06    Procedures Procedures (including critical care time)  Medications Ordered in ED Medications  gabapentin (NEURONTIN) capsule 400 mg (has no administration in time range)  LORazepam (ATIVAN) tablet 1 mg (has no administration in time range)  nitroGLYCERIN 50 mg in dextrose 5 % 250 mL (0.2 mg/mL) infusion (has no administration in time range)  albuterol (PROVENTIL) (2.5 MG/3ML) 0.083% nebulizer solution 5 mg (5 mg Nebulization Given 10/21/17 1322)  labetalol (NORMODYNE,TRANDATE) injection 20 mg (20 mg Intravenous Given 10/21/17 1438)  furosemide (LASIX) injection 40 mg (40 mg Intravenous Given 10/21/17 1438)     Initial Impression / Assessment and Plan / ED Course  I have reviewed the triage vital signs and the nursing notes.  Pertinent labs & imaging results that were available during my care of the patient were reviewed by me and considered in my medical decision making (see chart for details). CRITICAL CARE Performed by: Milton Ferguson Total critical care time: 35 minutes Critical care time was exclusive of separately billable procedures and treating other patients. Critical care was necessary to treat or prevent imminent or life-threatening deterioration. Critical care was time spent personally by me on the following activities: development of treatment plan with patient and/or surrogate as well as nursing, discussions with consultants, evaluation of patient's response to treatment,  examination of patient, obtaining history from patient or surrogate, ordering and performing treatments and interventions, ordering and review of laboratory studies, ordering and review of radiographic studies, pulse oximetry and re-evaluation of patient's condition.    Patient with congestive heart failure poorly controlled blood pressure and anemia.  Patient will be placed on nitroglycerin drip to control her blood pressure and her mild congestive heart failure and be admitted to stepdown by the hospitalist.   Final Clinical Impressions(s) / ED Diagnoses   Final diagnoses:  SOB (shortness of breath)    ED Discharge Orders    None       Milton Ferguson, MD 10/21/17 1620

## 2017-10-21 NOTE — ED Notes (Signed)
Charge RN on 2W stated that pt will not have bed due to rapid response pt is currently on that will be taking room.

## 2017-10-21 NOTE — ED Notes (Signed)
ED TO INPATIENT HANDOFF REPORT  Name/Age/Gender Sherry [name] 62 y.o. female  Code Status    Code Status Orders  (From admission, onward)        Start     Ordered   10/21/17 1842  Full code  Continuous     10/21/17 1842    Code Status History    Date Active Date Inactive Code Status Order ID Comments User Context   08/12/2017 1703 08/25/2017 1659 Full Code [id number]  Consuello Closs, NP Inpatient   08/09/2017 1936 08/12/2017 1629 Full Code [id number]  Cristy Folks, MD ED   08/09/2017 1235 08/09/2017 1936 Full Code [id number]  Fatima Blank, MD ED      Home/SNF/Other Home  Chief Complaint SOB  Level of Care/Admitting Diagnosis ED Disposition    ED Disposition Condition [place] [[id number]]  Level of Care: Stepdown [14]  Admit to SDU based on following criteria: Cardiac Instability:  Patients experiencing chest pain, unconfirmed MI and stable, arrhythmias and CHF requiring medical management and potentially compromising patient's stability  Diagnosis: Hypertensive emergency [id number]  Admitting Physician: Jonetta Osgood [[id number]]  Attending Physician: Jonetta Osgood [[id number]]  Estimated length of stay: past midnight tomorrow  Certification:: I certify this patient will need inpatient services for at least 2 midnights  PT Class (Do Not Modify): Inpatient [101]  PT Acc Code (Do Not Modify): Private [1]       Medical History Past Medical History:  Diagnosis Date  . Cancer ([place])   . Chronic back pain   . Depression (emotion)   . Hepatitis C   . Hypertension   . Opioid dependence ([place])    in [facility]    Allergies Allergies  Allergen Reactions  . Abilify [Aripiprazole]     Tardive dyskinesia Oral  . Remeron [Mirtazapine]     Wgt stimulation /gain Dizziness Patient says "can tolerate"  . Trazodone And Nefazodone     Nightmares/sleep diturbance  . Flexeril [Cyclobenzaprine]     Pt states  Flexeril makes her feel depressed   . Amoxicillin Rash    Has patient had a PCN reaction causing immediate rash, facial/tongue/throat swelling, SOB or lightheadedness with hypotension: Yes Has patient had a PCN reaction causing severe rash involving mucus membranes or skin necrosis: No Has patient had a PCN reaction that required hospitalization: Yes Has patient had a PCN reaction occurring within the last 10 years: yes If all of the above answers are "NO", then may proceed with Cephalosporin use.     IV Location/Drains/Wounds Patient Lines/Drains/Airways Status   Active Line/Drains/Airways    Name:   Placement date:   Placement time:   Site:   Days:   Peripheral IV 10/21/17 Right Antecubital   10/21/17    1323    Antecubital   less than 1   Peripheral IV 10/21/17 Right Hand   10/21/17    1921    Hand   less than 1          Labs/Imaging Results for orders placed or performed during the hospital encounter of 10/21/17 (from the past 48 hour(s))  Brain natriuretic peptide     Status: Abnormal   Collection Time: 10/21/17 12:54 PM  Result Value Ref Range   B Natriuretic Peptide 355.4 (H) 0.0 - 100.0 pg/mL    Comment: Performed at [facility], [place] [address] [place], [place] [address]  CBC with Differential/Platelet  Status: Abnormal   Collection Time: 10/21/17  1:22 PM  Result Value Ref Range   WBC 7.6 4.0 - 10.5 K/uL   RBC 3.30 (L) 3.87 - 5.11 MIL/uL   Hemoglobin 7.4 (L) 12.0 - 15.0 g/dL   HCT 25.4 (L) 36.0 - 46.0 %   MCV 77.0 (L) 78.0 - 100.0 fL   MCH 22.4 (L) 26.0 - 34.0 pg   MCHC 29.1 (L) 30.0 - 36.0 g/dL   RDW 17.5 (H) 11.5 - 15.5 %   Platelets 329 150 - 400 K/uL   Neutrophils Relative % 66 %   Neutro Abs 5.0 1.7 - 7.7 K/uL   Lymphocytes Relative 24 %   Lymphs Abs 1.8 0.7 - 4.0 K/uL   Monocytes Relative 7 %   Monocytes Absolute 0.5 0.1 - 1.0 K/uL   Eosinophils Relative 3 %   Eosinophils Absolute 0.2 0.0 - 0.7 K/uL   Basophils Relative 0 %    Basophils Absolute 0.0 0.0 - 0.1 K/uL    Comment: Performed at [facility], [place] [address] [place], [place] [address]  Comprehensive metabolic panel     Status: Abnormal   Collection Time: 10/21/17  1:22 PM  Result Value Ref Range   Sodium 144 135 - 145 mmol/L   Potassium 4.5 3.5 - 5.1 mmol/L   Chloride 108 101 - 111 mmol/L   CO2 27 22 - 32 mmol/L   Glucose, Bld 92 65 - 99 mg/dL   BUN 24 (H) 6 - 20 mg/dL   Creatinine, Ser 1.65 (H) 0.44 - 1.00 mg/dL   Calcium 9.1 8.9 - 10.3 mg/dL   Total Protein 7.3 6.5 - 8.1 g/dL   Albumin 3.1 (L) 3.5 - 5.0 g/dL   AST 17 15 - 41 U/L   ALT 11 (L) 14 - 54 U/L   Alkaline Phosphatase 90 38 - 126 U/L   Total Bilirubin 0.3 0.3 - 1.2 mg/dL   GFR calc non Af Amer 33 (L) >60 mL/min   GFR calc Af Amer 38 (L) >60 mL/min    Comment: (NOTE) The eGFR has been calculated using the CKD EPI equation. This calculation has not been validated in all clinical situations. eGFR's persistently <60 mL/min signify possible Chronic Kidney Disease.    Anion gap 9 5 - 15    Comment: Performed at [facility], [place] [address] [place], [place] [address]  I-stat troponin, ED     Status: None   Collection Time: 10/21/17  1:27 PM  Result Value Ref Range   Troponin i, poc 0.01 0.00 - 0.08 ng/mL   Comment 3            Comment: Due to the release kinetics of cTnI, a negative result within the first hours of the onset of symptoms does not rule out myocardial infarction with certainty. If myocardial infarction is still suspected, repeat the test at appropriate intervals.   POC occult blood, ED     Status: None   Collection Time: 10/21/17  4:06 PM  Result Value Ref Range   Fecal Occult Bld NEGATIVE NEGATIVE   Dg Chest 2 View  Result Date: 10/21/2017 CLINICAL DATA:  Shortness of breath and chest pain EXAM: CHEST - 2 VIEW COMPARISON:  July 27, 2017 FINDINGS: There is no edema or consolidation. Heart is borderline enlarged with  pulmonary vascularity within normal limits. No adenopathy. There is postoperative change in the lower cervical region. IMPRESSION: Borderline cardiac enlargement.  No edema or consolidation.  Electronically Signed   By: Lowella Grip III M.D.   On: 10/21/2017 13:06    Pending Labs Unresulted Labs (From admission, onward)   Start     Ordered   10/22/17 6979  Basic metabolic panel  Daily,   R     10/21/17 1841   10/21/17 1842  Type and screen  Once,   R     10/21/17 1842   10/21/17 1842  ABO/Rh  Once,   R     10/21/17 1841   10/21/17 1842  Prepare RBC  (Adult Blood Administration - Red Blood Cells)  Once,   R    Question Answer Comment  # of Units 1 unit   Transfusion Indications Symptomatic Anemia   If emergent release call blood bank [facility] [phone]      10/21/17 1842   10/21/17 1615  Vitamin B12  (Anemia Panel (PNL))  Once,   R     10/21/17 1614   10/21/17 1615  Folate  (Anemia Panel (PNL))  Once,   R     10/21/17 1614   10/21/17 1615  Iron and TIBC  (Anemia Panel (PNL))  Once,   R     10/21/17 1614   10/21/17 1615  Ferritin  (Anemia Panel (PNL))  Once,   R     10/21/17 1614   10/21/17 1615  Reticulocytes  (Anemia Panel (PNL))  Once,   R     10/21/17 1614   10/21/17 1615  TSH  Once,   R     10/21/17 1614      Vitals/Pain Today's Vitals   10/21/17 1900 10/21/17 1906 10/21/17 1920 10/21/17 1929  BP: (!) 185/82 (!) 186/87 (!) 170/92   Pulse: 77 75 74   Resp: (!) 22 16 19    Temp:      TempSrc:      SpO2: 100% 100% 100%   Weight:      Height:      PainSc:    9     Isolation Precautions No active isolations  Medications Medications  gabapentin (NEURONTIN) capsule 400 mg (has no administration in time range)  divalproex (DEPAKOTE ER) 24 hr tablet 500 mg (has no administration in time range)  labetalol (NORMODYNE) tablet 200 mg (has no administration in time range)  mirtazapine (REMERON) tablet 15 mg (has no administration in time range)  darifenacin  (ENABLEX) 24 hr tablet 15 mg (has no administration in time range)  nicotine (NICODERM CQ - dosed in mg/24 hours) patch 21 mg (has no administration in time range)  sodium chloride flush (NS) 0.9 % injection 3 mL (has no administration in time range)  sodium chloride flush (NS) 0.9 % injection 3 mL (has no administration in time range)  0.9 %  sodium chloride infusion (has no administration in time range)  acetaminophen (TYLENOL) tablet 650 mg (has no administration in time range)  ondansetron (ZOFRAN) injection 4 mg (has no administration in time range)  0.9 %  sodium chloride infusion (has no administration in time range)  acetaminophen (TYLENOL) tablet 650 mg (has no administration in time range)  diphenhydrAMINE (BENADRYL) injection 25 mg (has no administration in time range)  furosemide (LASIX) injection 80 mg (has no administration in time range)  isosorbide mononitrate (IMDUR) 24 hr tablet 30 mg (has no administration in time range)  hydrALAZINE (APRESOLINE) tablet 25 mg (has no administration in time range)  nitroGLYCERIN 50 mg in dextrose 5 % 250 mL (0.2 mg/mL) infusion (has  no administration in time range)  albuterol (PROVENTIL) (2.5 MG/3ML) 0.083% nebulizer solution 5 mg (5 mg Nebulization Given 10/21/17 1322)  labetalol (NORMODYNE,TRANDATE) injection 20 mg (20 mg Intravenous Given 10/21/17 1438)  furosemide (LASIX) injection 40 mg (40 mg Intravenous Given 10/21/17 1438)  gabapentin (NEURONTIN) capsule 400 mg (400 mg Oral Given 10/21/17 1639)  LORazepam (ATIVAN) tablet 1 mg (1 mg Oral Given 10/21/17 1639)  nitroGLYCERIN 50 mg in dextrose 5 % 250 mL (0.2 mg/mL) infusion (35 mcg/min Intravenous Rate/Dose Change 10/21/17 1928)    Mobility ambulatory

## 2017-10-21 NOTE — ED Notes (Signed)
Chaperone for Dr. Roderic Palau for rectal exam.

## 2017-10-21 NOTE — ED Notes (Signed)
Report rec'd from [place], [place]. IV nitro infusing. Pt c/o headache, offered tylenol, pt says "I can't have tylenol because my kidney function is bad, so I will just deal with it unless you could give me an extra gabapentin." Med orders reviewed with patient. .Labs obtained as ordered. Awaiting bed placement.

## 2017-10-21 NOTE — ED Notes (Signed)
Pt pulled off BP cuff. Stated that BP cuff hurts to much.

## 2017-10-21 NOTE — ED Notes (Signed)
Attempted report, receiving RN request call back in 10 minutes

## 2017-10-21 NOTE — ED Notes (Signed)
Patient transported to X-ray 

## 2017-10-21 NOTE — H&P (Addendum)
HISTORY AND PHYSICAL       PATIENT DETAILS Name: Sherry [name] Age: 62 y.o. Sex: female Date of Birth: 21-May-1956 Admit Date: 10/21/2017 XNT:[id number], Melissa A, FNP   Patient coming from: Home   CHIEF COMPLAINT:  Worsening exertional shortness of breath for the past 3 days  HPI: Sherry [name] is a 62 y.o. female with medical history significant of bipolar disorder, tardive dyskinesia (claims secondary to Abilify) hypertension, hep C-completed treatment with Harvoni approximately 1 year back-presenting to the ED for the above-noted complaints.  Apparently for the past 3 weeks or so-patient has noted some mild exertional dyspnea.  However for the past 3 days-exertional dyspnea has worsened-she can only walk around 15-20 feet before getting out of breath.  She has also noticed orthopnea-and for the past 3 days-has been using 3-4 pillows to sleep.  She also wakes up repeatedly at night because of shortness of breath.  She does acknowledge some mild lower extremity swelling.  Because of worsening shortness of breath, she presented to the emergency room, where she was found to have severely elevated systolic blood pressure more than 449 systolic, elevated BNP, and a hemoglobin of 7.5.  She was subsequently referred to the trial hospitalist service for further evaluation and treatment.  Patient denies any chest pain She denies any headache, nausea, vomiting or diarrhea. She denies any melena or hematochezia.  She claims she had a endoscopy and a colonoscopy last year at [facility] that was negative.  ED Course:  Given 40 mg IV Lasix, and 20 mg of IV labetalol.  Note: Lives at: Home Mobility: Independent Chronic Indwelling Foley:no   REVIEW OF SYSTEMS:  Constitutional:   No  weight loss, night sweats,  Fevers, chills, fatigue.  HEENT:    No headaches, Dysphagia,Tooth/dental problems,Sore throat,  No sneezing, itching, ear ache, nasal congestion, post nasal  drip  Cardio-vascular: No chest pain, anasarca, palpitations  GI:  No heartburn, indigestion, abdominal pain, nausea, vomiting, diarrhea, melena or hematochezia  Resp: No hemoptysis,plueritic chest pain.   Skin:  No rash or lesions.  GU:  No dysuria, change in color of urine, no urgency or frequency.  No flank pain.  Musculoskeletal: No joint pain or swelling.  No decreased range of motion.  No back pain.  Endocrine: No heat intolerance, no cold intolerance, no polyuria, no polydipsia  Psych: No change in mood or affect. No depression or anxiety.  No memory loss.   ALLERGIES:   Allergies  Allergen Reactions  . Abilify [Aripiprazole]     Tardive dyskinesia Oral  . Remeron [Mirtazapine]     Wgt stimulation /gain Dizziness Patient says "can tolerate"  . Trazodone And Nefazodone     Nightmares/sleep diturbance  . Flexeril [Cyclobenzaprine]     Pt states Flexeril makes her feel depressed   . Amoxicillin Rash    Has patient had a PCN reaction causing immediate rash, facial/tongue/throat swelling, SOB or lightheadedness with hypotension: Yes Has patient had a PCN reaction causing severe rash involving mucus membranes or skin necrosis: No Has patient had a PCN reaction that required hospitalization: Yes Has patient had a PCN reaction occurring within the last 10 years: yes If all of the above answers are "NO", then may proceed with Cephalosporin use.     PAST MEDICAL HISTORY: Past Medical History:  Diagnosis Date  . Cancer ([place])   . Chronic back pain   . Depression (emotion)   . Hepatitis C   .  Hypertension   . Opioid dependence ([place])    in [facility]    PAST SURGICAL HISTORY: Past Surgical History:  Procedure Laterality Date  . ABDOMINAL HYSTERECTOMY    . BACK SURGERY    . CHOLECYSTECTOMY    . MYOMECTOMY      MEDICATIONS AT HOME: Prior to Admission medications   Medication Sig Start Date End Date Taking? Authorizing Provider  Cyanocobalamin  (VITAMIN B-12) 1000 MCG SUBL  07/21/17  Yes [provider]  divalproex (DEPAKOTE ER) 500 MG 24 hr tablet Take 1 tablet (500 mg total) by mouth at bedtime. For mood control 09/06/17 10/21/17 Yes Dara Hoyer, PA-C  gabapentin (NEURONTIN) 400 MG capsule Take 1 capsule (400 mg total) by mouth 3 (three) times daily. For pain and withdrawal symptoms 09/06/17  Yes Dara Hoyer, PA-C  hydrochlorothiazide (HYDRODIURIL) 50 MG tablet Take 1 tablet (50 mg total) by mouth daily. For high blood pressure 08/25/17  Yes Money, Lowry Ram, FNP  hydrOXYzine (ATARAX/VISTARIL) 25 MG tablet Take 1 tablet (25 mg total) by mouth every 6 (six) hours as needed for anxiety. 09/15/17  Yes Darlyne Russian E, PA-C  INGREZZA 80 MG CAPS Take 80 mg by mouth daily. 10/15/17  Yes [provider]  labetalol (NORMODYNE) 200 MG tablet Take 1 tablet (200 mg total) by mouth 2 (two) times daily. For high blood pressure 09/06/17  Yes Dara Hoyer, PA-C  mirtazapine (REMERON) 15 MG tablet Take 15 mg by mouth at bedtime. 10/06/17  Yes [provider]  Multiple Vitamins-Minerals (ADULT GUMMY) CHEW Chew 1 tablet by mouth daily. 07/21/17  Yes [provider]  NICODERM CQ 21 MG/24HR patch Apply 1 patch topically daily. 10/06/17  Yes [provider]  pantoprazole (PROTONIX) 20 MG tablet Take 1 tablet (20 mg total) by mouth 2 (two) times daily. 09/06/17  Yes Dara Hoyer, PA-C  solifenacin (VESICARE) 10 MG tablet Take 10 mg by mouth. 08/28/16  Yes [provider]  Alcohol Swabs (ALCOHOL PREP) PADS  07/21/17   [provider]  Incontinence Supply Disposable (BLADDER CONTROL PADS EX ABSORB) [place]  07/21/17   [provider]  Melatonin 5 MG LOZG Place 1 lozenge under the tongue at bedtime as needed and may repeat dose one time if needed. Patient not taking: Reported on 10/21/2017 09/06/17 12/05/17  Dara Hoyer, PA-C    FAMILY HISTORY: Family History  Family history unknown: Yes     Denies any family history of anemia or congestive heart failure.  SOCIAL HISTORY:  reports that she has been smoking cigarettes.  She has been smoking about 1.00 pack per day. She has never used smokeless tobacco. She reports that she does not drink alcohol or use drugs.  PHYSICAL EXAM: Blood pressure (!) 213/99, pulse 77, temperature 97.9 F (36.6 C), temperature source Oral, resp. rate 17, height 5\' 2"  (1.575 m), weight 112.4 kg (247 lb 11.2 oz), SpO2 100 %.  General appearance :Awake, alert, not in any distress. Speech Clear. Not toxic Looking Eyes:, pupils equally reactive to light and accomodation,no scleral icterus.Pink conjunctiva HEENT: Atraumatic and Normocephalic Neck: supple, +JVD. Resp:Good air entry bilaterally, very few bibasilar rales CVS: S1 S2 regular, no murmurs.  GI: Bowel sounds present, Non tender and not distended with no gaurding, rigidity or rebound.No organomegaly Extremities: B/L Lower Ext shows + edema, both legs are warm to touch Neurology:  speech clear,Non focal, sensation is grossly intact. Psychiatric: Normal judgment and insight. Alert and oriented x 3.  Normal mood. Musculoskeletal:gait appears to be normal.No digital cyanosis Skin:No Rash, warm and dry Wounds:N/A  LABS ON ADMISSION:  I have personally reviewed following labs and imaging studies  CBC: Recent Labs  Lab 10/21/17 1322  WBC 7.6  NEUTROABS 5.0  HGB 7.4*  HCT 25.4*  MCV 77.0*  PLT 573    Basic Metabolic Panel: Recent Labs  Lab 10/21/17 1322  NA 144  K 4.5  CL 108  CO2 27  GLUCOSE 92  BUN 24*  CREATININE 1.65*  CALCIUM 9.1    GFR: Estimated Creatinine Clearance: 42.4 mL/min (A) (by C-G formula based on SCr of 1.65 mg/dL (H)).  Liver Function Tests: Recent Labs  Lab 10/21/17 1322  AST 17  ALT 11*  ALKPHOS 90  BILITOT 0.3  PROT 7.3  ALBUMIN 3.1*   No results for input(s): LIPASE, AMYLASE in the last 168 hours. No results for input(s): AMMONIA in the last  168 hours.  Coagulation Profile: No results for input(s): INR, PROTIME in the last 168 hours.  Cardiac Enzymes: No results for input(s): CKTOTAL, CKMB, CKMBINDEX, TROPONINI in the last 168 hours.  BNP (last 3 results) No results for input(s): PROBNP in the last 8760 hours.  HbA1C: No results for input(s): HGBA1C in the last 72 hours.  CBG: No results for input(s): GLUCAP in the last 168 hours.  Lipid Profile: No results for input(s): CHOL, HDL, LDLCALC, TRIG, CHOLHDL, LDLDIRECT in the last 72 hours.  Thyroid Function Tests: No results for input(s): TSH, T4TOTAL, FREET4, T3FREE, THYROIDAB in the last 72 hours.  Anemia Panel: No results for input(s): VITAMINB12, FOLATE, FERRITIN, TIBC, IRON, RETICCTPCT in the last 72 hours.  Urine analysis:    Component Value Date/Time   COLORURINE YELLOW 08/18/2017 1832   APPEARANCEUR CLEAR 08/18/2017 1832   LABSPEC 1.013 08/18/2017 1832   PHURINE 5.0 08/18/2017 1832   GLUCOSEU NEGATIVE 08/18/2017 1832   HGBUR NEGATIVE 08/18/2017 1832   BILIRUBINUR NEGATIVE 08/18/2017 1832   KETONESUR NEGATIVE 08/18/2017 1832   PROTEINUR 30 (A) 08/18/2017 1832   UROBILINOGEN 0.2 01/12/2010 1941   NITRITE NEGATIVE 08/18/2017 1832   LEUKOCYTESUR NEGATIVE 08/18/2017 1832    Sepsis Labs: Lactic Acid, Venous No results found for: [place]   Microbiology: No results found for this or any previous visit (from the past 240 hour(s)).    RADIOLOGIC STUDIES ON ADMISSION: Dg Chest 2 View  Result Date: 10/21/2017 CLINICAL DATA:  Shortness of breath and chest pain EXAM: CHEST - 2 VIEW COMPARISON:  July 27, 2017 FINDINGS: There is no edema or consolidation. Heart is borderline enlarged with pulmonary vascularity within normal limits. No adenopathy. There is postoperative change in the lower cervical region. IMPRESSION: Borderline cardiac enlargement.  No edema or consolidation. Electronically Signed   By: Lowella Grip III M.D.   On: 10/21/2017 13:06     I have personally reviewed images of chest xray-mild pulmonary congestion  EKG:  Personally reviewed.  Normal sinus rhythm  ASSESSMENT AND PLAN: Hypertensive emergency: She does have some evidence of decompensated heart failure-hence will start her on IV nitroglycerin.  Resume labetalol-add hydralazine and Imdur-an attempt to slowly titrate off nitroglycerin.  Target blood pressure to a systolic of 220-254 range.  Probable decompensated diastolic heart failure: Could be provoked due to uncontrolled hypertension-start IV Lasix, daily weights, intake/output.  Obtain echocardiogram.  Monitor renal function closely.  Microcytic anemia: Probably contributing to some amount of exertional dyspnea-hence will transfuse 1 unit of PRBC.  Patient appears to have chronic microcytic  anemia-per patient she had a negative endoscopy and colonoscopy within the past 1 year at [facility].  Reviewed discharge summary done on September 01, 2017 in care everywhere-which documents essentially negative endoscopy/colonoscopy-hence doubt these need to be repeated at this time.  She also appears to have had a capsule endoscopy in November 2018 that was negative per my review in everywhere.  Serum electrophoresis in December 2018 done at [facility] was negative for M spike as well.  Furthermore FOBT is negative.  Iron deficiency panel in 08/11/17 confirms iron deficiency-we will repeat iron panel-and if still present-may need IV iron.  Will discuss with gastroenterology over the phone-if this patient may benefit from small bowel biopsy/celiac disease serology workup.  History of hepatitis C: Apparently finished treatment with Harvoni in May 2018.  Recent CT scan of the abdomen and ultrasound of the abdomen done earlier this year at [facility] only shows some hepatic steatosis without any evidence of cirrhosis.  Chronic kidney disease stage III: Creatinine close to usual  baseline.  Follow for now  Chronic pain syndrome: Continue with Neurontin-claims that pain is mostly in her lower back.  Tardive dyskinesia: Claims that this is secondary to Abilify-supportive care-avoid medications like Reglan etc.  Morbid obesity  Tobacco use: Counseled-continue transdermal nicotine  Further plan will depend as patient's clinical course evolves and further radiologic and laboratory data become available. Patient will be monitored closely.  Above noted plan was discussed with patient face to face at bedside, she was in agreement  CONSULTS: None  DVT Prophylaxis: SCD's  Code Status: Full Code  Disposition Plan:  Discharge back home possibly in 2-3 days, depending on clinical course  Admission status:  Inpatient  going to SDU  The patient is critically ill with multiple organ system failure and requires high complexity decision making for assessment and support, frequent evaluation and titration of therapies, advanced monitoring, review of radiographic studies and interpretation of complex data.   Total time spent  65 minutes.Greater than 50% of this time was spent in counseling, explanation of diagnosis, planning of further management, and coordination of care.  Oren Binet [facility] Pager [phone]  If 7PM-7AM, please contact night-coverage [email or web address] Password [facility] 10/21/2017, 5:23 PM

## 2017-10-21 NOTE — ED Triage Notes (Signed)
Pt reports sob x3 days w/o cp. Pt reports worsening sob while laying down and upon physical exertion. Pt A+OX4, NAD.

## 2017-10-22 ENCOUNTER — Other Ambulatory Visit: Payer: Self-pay

## 2017-10-22 ENCOUNTER — Encounter (HOSPITAL_COMMUNITY): Payer: Self-pay | Admitting: Psychology

## 2017-10-22 ENCOUNTER — Inpatient Hospital Stay (HOSPITAL_COMMUNITY): Payer: Medicare HMO

## 2017-10-22 DIAGNOSIS — I161 Hypertensive emergency: Principal | ICD-10-CM

## 2017-10-22 DIAGNOSIS — I509 Heart failure, unspecified: Secondary | ICD-10-CM

## 2017-10-22 LAB — BASIC METABOLIC PANEL
Anion gap: 13 (ref 5–15)
BUN: 33 mg/dL — ABNORMAL HIGH (ref 6–20)
CO2: 28 mmol/L (ref 22–32)
Calcium: 9.2 mg/dL (ref 8.9–10.3)
Chloride: 100 mmol/L — ABNORMAL LOW (ref 101–111)
Creatinine, Ser: 2.08 mg/dL — ABNORMAL HIGH (ref 0.44–1.00)
GFR calc Af Amer: 28 mL/min — ABNORMAL LOW (ref >60)
GFR calc non Af Amer: 25 mL/min — ABNORMAL LOW (ref >60)
Glucose, Bld: 115 mg/dL — ABNORMAL HIGH (ref 65–99)
Potassium: 4 mmol/L (ref 3.5–5.1)
Sodium: 141 mmol/L (ref 135–145)

## 2017-10-22 LAB — BPAM RBC
Blood Product Expiration Date: 201905102359
ISSUE DATE / TIME: 201904112221
Unit Type and Rh: 5100

## 2017-10-22 LAB — TYPE AND SCREEN
ABO/RH(D): O POS
Antibody Screen: NEGATIVE
Unit division: 0

## 2017-10-22 LAB — HEMOGLOBIN AND HEMATOCRIT, BLOOD
HCT: 29.6 % — ABNORMAL LOW (ref 36.0–46.0)
Hemoglobin: 8.8 g/dL — ABNORMAL LOW (ref 12.0–15.0)

## 2017-10-22 LAB — FERRITIN: Ferritin: 8 ng/mL — ABNORMAL LOW (ref 11–307)

## 2017-10-22 LAB — ECHOCARDIOGRAM COMPLETE
Height: 62 in
Weight: 3940.06 oz

## 2017-10-22 LAB — IRON AND TIBC
Iron: 34 ug/dL (ref 28–170)
Saturation Ratios: 7 % — ABNORMAL LOW (ref 10.4–31.8)
TIBC: 458 ug/dL — ABNORMAL HIGH (ref 250–450)
UIBC: 424 ug/dL

## 2017-10-22 LAB — VITAMIN B12: Vitamin B-12: 1545 pg/mL — ABNORMAL HIGH (ref 180–914)

## 2017-10-22 LAB — MRSA PCR SCREENING: MRSA by PCR: NEGATIVE

## 2017-10-22 MED ORDER — HEPARIN SODIUM (PORCINE) 5000 UNIT/ML IJ SOLN
5000.0000 [IU] | Freq: Three times a day (TID) | INTRAMUSCULAR | Status: DC
  Administered 2017-10-22 – 2017-10-24 (×6): 5000 [IU] via SUBCUTANEOUS
  Filled 2017-10-22 (×6): qty 1

## 2017-10-22 MED ORDER — KETOROLAC TROMETHAMINE 30 MG/ML IJ SOLN
30.0000 mg | Freq: Once | INTRAMUSCULAR | Status: AC
  Administered 2017-10-22: 30 mg via INTRAVENOUS
  Filled 2017-10-22: qty 1

## 2017-10-22 MED ORDER — AMLODIPINE BESYLATE 10 MG PO TABS
10.0000 mg | ORAL_TABLET | Freq: Every day | ORAL | Status: DC
  Administered 2017-10-22 – 2017-10-24 (×3): 10 mg via ORAL
  Filled 2017-10-22 (×3): qty 1

## 2017-10-22 MED ORDER — PROCHLORPERAZINE EDISYLATE 5 MG/ML IJ SOLN
10.0000 mg | Freq: Once | INTRAMUSCULAR | Status: AC
Start: 2017-10-22 — End: 2017-10-22
  Administered 2017-10-22: 10 mg via INTRAVENOUS
  Filled 2017-10-22: qty 2

## 2017-10-22 MED ORDER — LORAZEPAM 0.5 MG PO TABS
0.5000 mg | ORAL_TABLET | Freq: Once | ORAL | Status: AC
  Administered 2017-10-22: 0.5 mg via ORAL
  Filled 2017-10-22: qty 1

## 2017-10-22 MED ORDER — LABETALOL HCL 200 MG PO TABS
300.0000 mg | ORAL_TABLET | Freq: Two times a day (BID) | ORAL | Status: DC
  Administered 2017-10-22: 200 mg via ORAL
  Administered 2017-10-23 – 2017-10-24 (×3): 300 mg via ORAL
  Filled 2017-10-22 (×4): qty 1

## 2017-10-22 MED ORDER — PROMETHAZINE HCL 25 MG/ML IJ SOLN
25.0000 mg | Freq: Once | INTRAMUSCULAR | Status: AC
Start: 2017-10-22 — End: 2017-10-22
  Administered 2017-10-22: 25 mg via INTRAMUSCULAR
  Filled 2017-10-22: qty 1

## 2017-10-22 MED ORDER — HYDROCHLOROTHIAZIDE 25 MG PO TABS
50.0000 mg | ORAL_TABLET | Freq: Every day | ORAL | Status: DC
  Administered 2017-10-22 – 2017-10-24 (×3): 50 mg via ORAL
  Filled 2017-10-22 (×3): qty 2

## 2017-10-22 MED ORDER — ZOLPIDEM TARTRATE 5 MG PO TABS
5.0000 mg | ORAL_TABLET | Freq: Every evening | ORAL | Status: DC | PRN
  Administered 2017-10-22 – 2017-10-23 (×2): 5 mg via ORAL
  Filled 2017-10-22 (×2): qty 1

## 2017-10-22 MED ORDER — ZOLPIDEM TARTRATE 5 MG PO TABS: 5.0000 mg | ORAL_TABLET | Freq: Every evening | ORAL | Status: DC | PRN

## 2017-10-22 MED ORDER — DIPHENHYDRAMINE HCL 50 MG/ML IJ SOLN
25.0000 mg | Freq: Once | INTRAMUSCULAR | Status: AC
  Administered 2017-10-22: 25 mg via INTRAVENOUS
  Filled 2017-10-22: qty 1

## 2017-10-22 MED ORDER — BUTALBITAL-APAP-CAFFEINE 50-325-40 MG PO TABS
1.0000 | ORAL_TABLET | Freq: Four times a day (QID) | ORAL | Status: DC | PRN
  Administered 2017-10-22: 1 via ORAL
  Filled 2017-10-22: qty 1

## 2017-10-22 MED ORDER — GUAIFENESIN-DM 100-10 MG/5ML PO SYRP
15.0000 mL | ORAL_SOLUTION | ORAL | Status: DC | PRN
  Administered 2017-10-22: 15 mL via ORAL
  Filled 2017-10-22 (×2): qty 20

## 2017-10-22 MED ORDER — BUTALBITAL-APAP-CAFFEINE 50-325-40 MG PO TABS
1.0000 | ORAL_TABLET | Freq: Four times a day (QID) | ORAL | Status: DC | PRN
  Administered 2017-10-22: 1 via ORAL
  Administered 2017-10-23 – 2017-10-24 (×4): 2 via ORAL
  Filled 2017-10-22: qty 1
  Filled 2017-10-22 (×2): qty 2
  Filled 2017-10-22: qty 1
  Filled 2017-10-22 (×2): qty 2

## 2017-10-22 NOTE — Progress Notes (Signed)
PROGRESS NOTE    Sherry [name]  JEH:[id number] DOB: [birth year] DOA: 10/21/2017 PCP: Henderson Baltimore, FNP    Brief Narrative:  62 year old with past medical history relevant for hepatitis C status post treatment, stage III chronic kidney disease, hypertension, bipolar disorder, gated by tar dive dyskinesia (secondary to aripiprazole) who was admitted to the emergency department with hypertensive emergency,  and anemia.   Assessment & Plan:   Active Problems:   Hepatitis C virus infection cured after antiviral drug therapy   Chronic kidney disease, stage III (moderate) (HCC)   History of breast cancer   Morbid obesity (HCC)   Neuroleptic-induced tardive dyskinesia   Chronic low back pain   Hypertensive emergency   Acute diastolic CHF (congestive heart failure) (HCC)   Microcytic anemia   #) Hypertensive emergency: Patient had shortness of breath, worsening lower extremity swelling, chest pain.  Suspect most likely due to medication noncompliance however patient denies this.  Other causes could be CKD however she has had this for some time.  She apparently was in a rehab group however she denies any recreational drug use. -Continue IV nitroglycerin -Continue labetalol 200 mg twice daily -Continue isosorbide mononitrate 300 mg daily -Restart home HCTZ 50 mg daily -Start amlodipine 10 mg daily -TTE pending  #) Stage III CKD: Patient's CKD appears to be stable -Avoid nephrotoxins -Renal ultrasound ordered  #) Bipolar disorder: -Continue divalproex 5 mg nightly  #) Tardive dyskinesia: -Continue velbenzaine 80mg  daily  #) Pain/psych: - Continue gabapentin 400 mg 3 times daily -Continue mirtazapine 50 mg nightly  Fluids: Restrict Elect lites: Monitor and supplement Nutrition: Heart healthy diet  prophylaxis: Heparin  Disposition: Pending stabilized blood pressure  Full code  Consultants:   None  Procedures: (Don't include imaging studies which can be auto  populated. Include things that cannot be auto populated i.e. Echo, Carotid and venous dopplers, Foley, Bipap, HD, tubes/drains, wound vac, central lines etc)  10/22/2017 echo  10/22/2017 renal ultrasound  Antimicrobials: (specify start and planned stop date. Auto populated tables are space occupying and do not give end dates)  None   Subjective: Patient reports he continues to have difficulty taking a deep breath.  She denies any chest pain but does report some chest tightness.  She denies any nausea, vomiting, abdominal pain.  She does continue to endorse lower extremity edema.  Objective: Vitals:   10/22/17 0916 10/22/17 1000 10/22/17 1100 10/22/17 1139  BP: (!) 164/78 (!) 166/44 (!) 143/43   Pulse: 79 75 75   Resp:  18 13   Temp:    99.1 F (37.3 C)  TempSrc:    Oral  SpO2:  100% 100%   Weight:      Height:        Intake/Output Summary (Last 24 hours) at 10/22/2017 1200 Last data filed at 10/22/2017 0920 Gross per 24 hour  Intake 707.26 ml  Output 1901 ml  Net -1193.74 ml   Filed Weights   10/21/17 1228 10/21/17 2045 10/22/17 0127  Weight: 112.4 kg (247 lb 11.2 oz) 112.4 kg (247 lb 12.8 oz) 111.7 kg (246 lb 4.1 oz)    Examination:  General exam: Appears calm and comfortable  Respiratory system: No increased work of breathing, no crackles, rales, rhonchi. Cardiovascular system: Distant heart sounds, regular rate and rhythm, no murmurs Gastrointestinal system: Soft, nontender, nondistended, plus bowel sounds Central nervous system: Alert and oriented. No focal neurological deficits.  Lip smacking noted Extremities: 1+ lower extremity edema Skin: No rashes on visible  skin Psychiatry: Judgement and insight appear normal. Mood & affect appropriate.     Data Reviewed: I have personally reviewed following labs and imaging studies  CBC: Recent Labs  Lab 10/21/17 1322 10/22/17 0334  WBC 7.6  --   NEUTROABS 5.0  --   HGB 7.4* 8.8*  HCT 25.4* 29.6*  MCV 77.0*  --     PLT 329  --    Basic Metabolic Panel: Recent Labs  Lab 10/21/17 1322 10/22/17 0334  NA 144 141  K 4.5 4.0  CL 108 100*  CO2 27 28  GLUCOSE 92 115*  BUN 24* 33*  CREATININE 1.65* 2.08*  CALCIUM 9.1 9.2   GFR: Estimated Creatinine Clearance: 33.5 mL/min (A) (by C-G formula based on SCr of 2.08 mg/dL (H)). Liver Function Tests: Recent Labs  Lab 10/21/17 1322  AST 17  ALT 11*  ALKPHOS 90  BILITOT 0.3  PROT 7.3  ALBUMIN 3.1*   No results for input(s): LIPASE, AMYLASE in the last 168 hours. No results for input(s): AMMONIA in the last 168 hours. Coagulation Profile: No results for input(s): INR, PROTIME in the last 168 hours. Cardiac Enzymes: No results for input(s): CKTOTAL, CKMB, CKMBINDEX, TROPONINI in the last 168 hours. BNP (last 3 results) No results for input(s): PROBNP in the last 8760 hours. HbA1C: No results for input(s): HGBA1C in the last 72 hours. CBG: No results for input(s): GLUCAP in the last 168 hours. Lipid Profile: No results for input(s): CHOL, HDL, LDLCALC, TRIG, CHOLHDL, LDLDIRECT in the last 72 hours. Thyroid Function Tests: Recent Labs    10/21/17 1920  TSH 0.882   Anemia Panel: Recent Labs    10/21/17 1920  VITAMINB12 1,545*  FOLATE 9.6  FERRITIN 8*  TIBC 458*  IRON 34  RETICCTPCT 2.1   Sepsis Labs: No results for input(s): PROCALCITON, LATICACIDVEN in the last 168 hours.  Recent Results (from the past 240 hour(s))  MRSA PCR Screening     Status: None   Collection Time: 10/21/17 10:06 PM  Result Value Ref Range Status   MRSA by PCR NEGATIVE NEGATIVE Final    Comment:        The GeneXpert MRSA Assay (FDA approved for NASAL specimens only), is one component of a comprehensive MRSA colonization surveillance program. It is not intended to diagnose MRSA infection nor to guide or monitor treatment for MRSA infections. Performed at [facility], [place] [address], [place], [place] [address]           Radiology Studies: Dg Chest 2 View  Result Date: 10/21/2017 CLINICAL DATA:  Shortness of breath and chest pain EXAM: CHEST - 2 VIEW COMPARISON:  July 27, 2017 FINDINGS: There is no edema or consolidation. Heart is borderline enlarged with pulmonary vascularity within normal limits. No adenopathy. There is postoperative change in the lower cervical region. IMPRESSION: Borderline cardiac enlargement.  No edema or consolidation. Electronically Signed   By: Lowella Grip III M.D.   On: 10/21/2017 13:06   US Renal  Result Date: 10/22/2017 CLINICAL DATA:  Hypertension.  Acute kidney injury.  Hepatitis C. EXAM: RENAL / URINARY TRACT ULTRASOUND COMPLETE COMPARISON:  CT abdomen and pelvis 08/04/2017. FINDINGS: Right Kidney: Length: 12.2 cm. Echogenicity within normal limits. No mass or hydronephrosis visualized. Two subcentimeter cysts are visualized, non worrisome. Left Kidney: Length: 10.8 cm. Echogenicity within normal limits. No mass or hydronephrosis visualized. Multiple cysts are present, with a dominant upper pole cyst measuring up to 3.8 cm in maximum diameter.  Bladder: Appears normal for degree of bladder distention. IMPRESSION: No hydronephrosis or significant abnormal echogenicity. Normal renal size. Electronically Signed   By: Staci Righter M.D.   On: 10/22/2017 08:50        Scheduled Meds: . acetaminophen  650 mg Oral Once  . darifenacin  15 mg Oral Daily  . diphenhydrAMINE  25 mg Intravenous Once  . divalproex  500 mg Oral QHS  . gabapentin  400 mg Oral TID  . hydrALAZINE  25 mg Oral Q8H  . isosorbide mononitrate  30 mg Oral Daily  . labetalol  200 mg Oral BID  . mirtazapine  15 mg Oral QHS  . nicotine  21 mg Transdermal Daily  . sodium chloride flush  3 mL Intravenous Q12H   Continuous Infusions: . sodium chloride    . sodium chloride    . nitroGLYCERIN 70 mcg/min (10/22/17 1110)     LOS: 1 day    Time spent: [place], MD [facility]   If 7PM-7AM, please contact night-coverage [email or web address] Password [email or web address] 10/22/2017, 12:00 PM

## 2017-10-22 NOTE — Progress Notes (Signed)
    Daily Group Progress Note  Program: CD-IOP   10/22/2017 [name] [id number]  Diagnosis: Alcohol and Opiate use disorder, severe  Sobriety Date: 08/11/17  Group Time: 1-2:30pm  Participation Level: Active  Behavioral Response: Sharing  Type of Therapy: Process Group  Interventions: Supportive  Topic: Patients were active and engaged in process session.  Patients shared accomplishments and challenges with relationships and recovery.  Patients shared about their experiences of recovery meetings and the progression of addiction.  Patients provided one another with feedback surrounding the course of addiction and how they have come to identify themselves as addicts or alcoholics.  Group Time: 2:30-4pm  Participation Level: Active  Behavioral Response: Sharing  Type of Therapy: Psycho-education Group  Interventions: Family Systems  Topic: Patients were active and engaged in psychoeducation session, in which counselors facilitated continued discussion around family through family sculpting.  Several more patients sculpted their families, using other group members to represent their family members, including themselves.  Patients responded well to psychoeducation and engaged in active discussion  Summary: Patient was engaged in session.  Patient provided helpful feedback about her own course of addiction in relation to other group members.  Patient said she slept well the night before.  Patient said she did not like family sculpting from the day before and felt that she shared too much.  Counselors and other group members gently challenged patient to consider how sharing some of her pain may be beneficial to healing.  Patient received feedback from other group members that she may be building walls to both keep others out and to hide herself.  Patient said she never had the chance to process dysfunction with her family.    UDS collected:  No  AA/NA attended?: No  Sponsor?:  Yes   Brandon Melnick, LCAS 10/22/2017 1:57 PM

## 2017-10-22 NOTE — Progress Notes (Signed)
  Echocardiogram 2D Echocardiogram has been performed.  Bobbye Charleston 10/22/2017, 2:14 PM

## 2017-10-23 LAB — BASIC METABOLIC PANEL
Anion gap: 15 (ref 5–15)
BUN: 41 mg/dL — ABNORMAL HIGH (ref 6–20)
CO2: 25 mmol/L (ref 22–32)
Calcium: 8.7 mg/dL — ABNORMAL LOW (ref 8.9–10.3)
Chloride: 98 mmol/L — ABNORMAL LOW (ref 101–111)
Creatinine, Ser: 2.71 mg/dL — ABNORMAL HIGH (ref 0.44–1.00)
GFR calc Af Amer: 21 mL/min — ABNORMAL LOW (ref >60)
GFR calc non Af Amer: 18 mL/min — ABNORMAL LOW (ref >60)
Glucose, Bld: 122 mg/dL — ABNORMAL HIGH (ref 65–99)
Potassium: 4.3 mmol/L (ref 3.5–5.1)
Sodium: 138 mmol/L (ref 135–145)

## 2017-10-23 LAB — CBC
HCT: 29.9 % — ABNORMAL LOW (ref 36.0–46.0)
Hemoglobin: 8.8 g/dL — ABNORMAL LOW (ref 12.0–15.0)
MCH: 23.2 pg — ABNORMAL LOW (ref 26.0–34.0)
MCHC: 29.4 g/dL — ABNORMAL LOW (ref 30.0–36.0)
MCV: 78.7 fL (ref 78.0–100.0)
Platelets: 337 10*3/uL (ref 150–400)
RBC: 3.8 MIL/uL — ABNORMAL LOW (ref 3.87–5.11)
RDW: 18.3 % — ABNORMAL HIGH (ref 11.5–15.5)
WBC: 10.7 10*3/uL — ABNORMAL HIGH (ref 4.0–10.5)

## 2017-10-23 MED ORDER — TRAMADOL HCL 50 MG PO TABS
50.0000 mg | ORAL_TABLET | Freq: Once | ORAL | Status: AC
  Administered 2017-10-23: 50 mg via ORAL
  Filled 2017-10-23: qty 1

## 2017-10-23 MED ORDER — FERROUS SULFATE 325 (65 FE) MG PO TABS
325.0000 mg | ORAL_TABLET | Freq: Every day | ORAL | Status: DC
  Administered 2017-10-24: 325 mg via ORAL
  Filled 2017-10-23: qty 1

## 2017-10-23 MED ORDER — PROMETHAZINE HCL 25 MG/ML IJ SOLN
25.0000 mg | Freq: Once | INTRAMUSCULAR | Status: AC
  Administered 2017-10-23: 25 mg via INTRAVENOUS
  Filled 2017-10-23: qty 1

## 2017-10-23 MED ORDER — PROMETHAZINE HCL 25 MG/ML IJ SOLN
25.0000 mg | Freq: Four times a day (QID) | INTRAMUSCULAR | Status: DC | PRN
  Administered 2017-10-23: 25 mg via INTRAVENOUS
  Filled 2017-10-23: qty 1

## 2017-10-23 MED ORDER — HYDRALAZINE HCL 20 MG/ML IJ SOLN: 10.0000 mg | INTRAMUSCULAR | Status: DC | PRN

## 2017-10-23 MED ORDER — ISOSORBIDE MONONITRATE ER 60 MG PO TB24
60.0000 mg | ORAL_TABLET | Freq: Every day | ORAL | Status: DC
  Administered 2017-10-23 – 2017-10-24 (×2): 60 mg via ORAL
  Filled 2017-10-23 (×2): qty 1

## 2017-10-23 NOTE — Progress Notes (Addendum)
PROGRESS NOTE    Sherry [name]  YPP:[id number] DOB: Sep 15, 1955 DOA: 10/21/2017 PCP: Henderson Baltimore, FNP    Brief Narrative:  62 year old with past medical history relevant for hepatitis C status post treatment, stage III chronic kidney disease, hypertension, bipolar disorder, gated by tar dive dyskinesia (secondary to aripiprazole) who was admitted to the emergency department with hypertensive emergency,  and anemia.   Assessment & Plan:   Active Problems:   Hepatitis C virus infection cured after antiviral drug therapy   Chronic kidney disease, stage III (moderate) (HCC)   History of breast cancer   Morbid obesity (HCC)   Neuroleptic-induced tardive dyskinesia   Chronic low back pain   Hypertensive emergency   Acute diastolic CHF (congestive heart failure) (HCC)   Microcytic anemia   #) Hypertensive emergency: Likely secondary to CKD and noncompliance -Discontinue IV nitroglycerin -Increase labetalol 400 mg twice daily -Increase isosorbide mononitrate 60 mg daily -Continue home HCTZ 50 mg daily -Continue amlodipine 10 mg daily -TTE done on 10/22/2017 shows only LVH and grade 1 diastolic dysfunction  #) Chronic anemia: Patient was admitted with mild worsening of chronic anemia.  She received 1 unit packed red blood cells.  She has had long-standing anemia likely related to her chronic kidney disease as well as iron deficiency.  She apparently had a colonoscopy approximately 6 months ago that was normal except for some polyps. -Start iron supplementation 325 daily  #) Headache: Pt developed headache secondary to NTG (less likely HTN as her BP has been normalized) - PRN fiorcet - avoid NSAIDs d/t AKI/CKD  #) Acute on chronic stage III CKD: Patient is developed mild AK I on CKD. -Avoid nephrotoxins -Renal ultrasound on 10/22/2017 unremarkable  #) Bipolar disorder: -Continue divalproex 5 mg nightly  #) Tardive dyskinesia: -Continue velbenzaine 80mg  daily  #)  Pain/psych: - Continue gabapentin 400 mg 3 times daily -Continue mirtazapine 50 mg nightly  Fluids: Restrict Elect lites: Monitor and supplement Nutrition: Heart healthy diet  prophylaxis: Heparin  Disposition: Pending stabilized blood pressure  Full code  Consultants:   None  Procedures: (Don't include imaging studies which can be auto populated. Include things that cannot be auto populated i.e. Echo, Carotid and venous dopplers, Foley, Bipap, HD, tubes/drains, wound vac, central lines etc) 10/22/2017 echo: - Vigorous LV systolic function; mild diastolic dysfunction; mild   LVH.  10/22/2017 renal ultrasound:No hydronephrosis or significant abnormal echogenicity. Normal renal size.  Antimicrobials: (specify start and planned stop date. Auto populated tables are space occupying and do not give end dates)  None   Subjective: Patient reports that she is doing well.  She denies any chest pain, nausea, vomiting, diarrhea.  She continues to report a mild headache that improved dramatically with treatment.  Objective: Vitals:   10/23/17 0801 10/23/17 0852 10/23/17 0853 10/23/17 0900  BP:  (!) 166/83 (!) 166/83 (!) 158/80  Pulse:  76  78  Resp:    17  Temp: 98.2 F (36.8 C)     TempSrc: Oral     SpO2:    97%  Weight:      Height:        Intake/Output Summary (Last 24 hours) at 10/23/2017 1100 Last data filed at 10/23/2017 0836 Gross per 24 hour  Intake 370.62 ml  Output 700 ml  Net -329.38 ml   Filed Weights   10/21/17 2045 10/22/17 0127 10/23/17 0600  Weight: 112.4 kg (247 lb 12.8 oz) 111.7 kg (246 lb 4.1 oz) 115.3 kg (254 lb 3.1  oz)    Examination:  General exam: Appears calm and comfortable  Respiratory system: No increased work of breathing, no crackles, rales, rhonchi. Cardiovascular system: Distant heart sounds, regular rate and rhythm, no murmurs Gastrointestinal system: Soft, nontender, nondistended, plus bowel sounds Central nervous system: Alert and  oriented. No focal neurological deficits.  Lip smacking noted Extremities: 1+ lower extremity edema Skin: No rashes on visible skin Psychiatry: Judgement and insight appear normal. Mood & affect appropriate.     Data Reviewed: I have personally reviewed following labs and imaging studies  CBC: Recent Labs  Lab 10/21/17 1322 10/22/17 0334 10/23/17 0348  WBC 7.6  --  10.7*  NEUTROABS 5.0  --   --   HGB 7.4* 8.8* 8.8*  HCT 25.4* 29.6* 29.9*  MCV 77.0*  --  78.7  PLT 329  --  253   Basic Metabolic Panel: Recent Labs  Lab 10/21/17 1322 10/22/17 0334 10/23/17 0348  NA 144 141 138  K 4.5 4.0 4.3  CL 108 100* 98*  CO2 27 28 25   GLUCOSE 92 115* 122*  BUN 24* 33* 41*  CREATININE 1.65* 2.08* 2.71*  CALCIUM 9.1 9.2 8.7*   GFR: Estimated Creatinine Clearance: 26.2 mL/min (A) (by C-G formula based on SCr of 2.71 mg/dL (H)). Liver Function Tests: Recent Labs  Lab 10/21/17 1322  AST 17  ALT 11*  ALKPHOS 90  BILITOT 0.3  PROT 7.3  ALBUMIN 3.1*   No results for input(s): LIPASE, AMYLASE in the last 168 hours. No results for input(s): AMMONIA in the last 168 hours. Coagulation Profile: No results for input(s): INR, PROTIME in the last 168 hours. Cardiac Enzymes: No results for input(s): CKTOTAL, CKMB, CKMBINDEX, TROPONINI in the last 168 hours. BNP (last 3 results) No results for input(s): PROBNP in the last 8760 hours. HbA1C: No results for input(s): HGBA1C in the last 72 hours. CBG: No results for input(s): GLUCAP in the last 168 hours. Lipid Profile: No results for input(s): CHOL, HDL, LDLCALC, TRIG, CHOLHDL, LDLDIRECT in the last 72 hours. Thyroid Function Tests: Recent Labs    10/21/17 1920  TSH 0.882   Anemia Panel: Recent Labs    10/21/17 1920  VITAMINB12 1,545*  FOLATE 9.6  FERRITIN 8*  TIBC 458*  IRON 34  RETICCTPCT 2.1   Sepsis Labs: No results for input(s): PROCALCITON, LATICACIDVEN in the last 168 hours.  Recent Results (from the past 240  hour(s))  MRSA PCR Screening     Status: None   Collection Time: 10/21/17 10:06 PM  Result Value Ref Range Status   MRSA by PCR NEGATIVE NEGATIVE Final    Comment:        The GeneXpert MRSA Assay (FDA approved for NASAL specimens only), is one component of a comprehensive MRSA colonization surveillance program. It is not intended to diagnose MRSA infection nor to guide or monitor treatment for MRSA infections. Performed at [facility], [place] [address] [place], [place] [address]          Radiology Studies: Dg Chest 2 View  Result Date: 10/21/2017 CLINICAL DATA:  Shortness of breath and chest pain EXAM: CHEST - 2 VIEW COMPARISON:  July 27, 2017 FINDINGS: There is no edema or consolidation. Heart is borderline enlarged with pulmonary vascularity within normal limits. No adenopathy. There is postoperative change in the lower cervical region. IMPRESSION: Borderline cardiac enlargement.  No edema or consolidation. Electronically Signed   By: Lowella Grip III M.D.   On: 10/21/2017 13:06  US Renal  Result Date: 10/22/2017 CLINICAL DATA:  Hypertension.  Acute kidney injury.  Hepatitis C. EXAM: RENAL / URINARY TRACT ULTRASOUND COMPLETE COMPARISON:  CT abdomen and pelvis 08/04/2017. FINDINGS: Right Kidney: Length: 12.2 cm. Echogenicity within normal limits. No mass or hydronephrosis visualized. Two subcentimeter cysts are visualized, non worrisome. Left Kidney: Length: 10.8 cm. Echogenicity within normal limits. No mass or hydronephrosis visualized. Multiple cysts are present, with a dominant upper pole cyst measuring up to 3.8 cm in maximum diameter. Bladder: Appears normal for degree of bladder distention. IMPRESSION: No hydronephrosis or significant abnormal echogenicity. Normal renal size. Electronically Signed   By: Staci Righter M.D.   On: 10/22/2017 08:50        Scheduled Meds: . acetaminophen  650 mg Oral Once  . amLODipine  10 mg Oral Daily  .  darifenacin  15 mg Oral Daily  . diphenhydrAMINE  25 mg Intravenous Once  . divalproex  500 mg Oral QHS  . gabapentin  400 mg Oral TID  . heparin injection (subcutaneous)  5,000 Units Subcutaneous Q8H  . hydrALAZINE  25 mg Oral Q8H  . hydrochlorothiazide  50 mg Oral Daily  . isosorbide mononitrate  60 mg Oral Daily  . labetalol  300 mg Oral BID  . mirtazapine  15 mg Oral QHS  . nicotine  21 mg Transdermal Daily  . sodium chloride flush  3 mL Intravenous Q12H   Continuous Infusions: . sodium chloride 500 mL (10/23/17 0906)  . sodium chloride    . nitroGLYCERIN Stopped (10/23/17 0836)     LOS: 2 days    Time spent: [place], MD [facility]   If 7PM-7AM, please contact night-coverage [email or web address] Password [email or web address] 10/23/2017, 11:00 AM

## 2017-10-24 ENCOUNTER — Inpatient Hospital Stay (HOSPITAL_COMMUNITY): Payer: Medicare HMO

## 2017-10-24 LAB — BASIC METABOLIC PANEL
Anion gap: 11 (ref 5–15)
BUN: 55 mg/dL — ABNORMAL HIGH (ref 6–20)
CO2: 23 mmol/L (ref 22–32)
Calcium: 8.4 mg/dL — ABNORMAL LOW (ref 8.9–10.3)
Chloride: 102 mmol/L (ref 101–111)
Creatinine, Ser: 2.7 mg/dL — ABNORMAL HIGH (ref 0.44–1.00)
GFR calc Af Amer: 21 mL/min — ABNORMAL LOW (ref >60)
GFR calc non Af Amer: 18 mL/min — ABNORMAL LOW (ref >60)
Glucose, Bld: 127 mg/dL — ABNORMAL HIGH (ref 65–99)
Potassium: 4.2 mmol/L (ref 3.5–5.1)
Sodium: 136 mmol/L (ref 135–145)

## 2017-10-24 MED ORDER — ISOSORBIDE MONONITRATE ER 60 MG PO TB24: 60.0000 mg | ORAL_TABLET | Freq: Every day | ORAL | 1 refills | Status: DC

## 2017-10-24 MED ORDER — PROCHLORPERAZINE EDISYLATE 5 MG/ML IJ SOLN
10.0000 mg | Freq: Once | INTRAMUSCULAR | Status: AC
Start: 2017-10-24 — End: 2017-10-24
  Administered 2017-10-24: 10 mg via INTRAVENOUS
  Filled 2017-10-24: qty 2

## 2017-10-24 MED ORDER — BUTALBITAL-APAP-CAFFEINE 50-325-40 MG PO TABS: 1.0000 | ORAL_TABLET | Freq: Four times a day (QID) | ORAL | 0 refills | Status: DC | PRN

## 2017-10-24 MED ORDER — HYDROCORTISONE 1 % EX CREA
1.0000 "application " | TOPICAL_CREAM | Freq: Three times a day (TID) | CUTANEOUS | Status: DC | PRN
  Filled 2017-10-24: qty 28

## 2017-10-24 MED ORDER — AMLODIPINE BESYLATE 10 MG PO TABS: 10.0000 mg | ORAL_TABLET | Freq: Every day | ORAL | 1 refills | Status: DC

## 2017-10-24 MED ORDER — METOCLOPRAMIDE HCL 10 MG PO TABS: 10.0000 mg | ORAL_TABLET | Freq: Three times a day (TID) | ORAL | 0 refills | Status: DC

## 2017-10-24 MED ORDER — HYDRALAZINE HCL 25 MG PO TABS: 25.0000 mg | ORAL_TABLET | Freq: Three times a day (TID) | ORAL | 1 refills | Status: DC

## 2017-10-24 MED ORDER — LABETALOL HCL 300 MG PO TABS: 300.0000 mg | ORAL_TABLET | Freq: Two times a day (BID) | ORAL | 1 refills | Status: DC

## 2017-10-24 MED ORDER — DIPHENHYDRAMINE HCL 50 MG/ML IJ SOLN
25.0000 mg | Freq: Once | INTRAMUSCULAR | Status: AC
  Administered 2017-10-24: 25 mg via INTRAVENOUS
  Filled 2017-10-24: qty 1

## 2017-10-24 MED ORDER — FERROUS SULFATE 325 (65 FE) MG PO TABS: 325.0000 mg | ORAL_TABLET | Freq: Every day | ORAL | 3 refills | Status: DC

## 2017-10-24 NOTE — Progress Notes (Signed)
Pt VS stable, monitor removed.  Pt has all belongings except the bottom dentures she says she did not have since coming up from the [place].  Patient was advised that we are not responsible for patient belongings but she can call the hospital back in a couple of days to see if they have been found.  Charge nurse is aware.    Patient wheeled to [facility] to be picked up by taxi.

## 2017-10-24 NOTE — Discharge Instructions (Signed)

## 2017-10-24 NOTE — Progress Notes (Signed)
All discharge instructions covered with patient.  Patient stated understanding.  Pt signed document and put on chart.  Patient given information on obtaining nicotine patches through hotline number provided.

## 2017-10-24 NOTE — Discharge Summary (Signed)
Physician Discharge Summary  Sherry [name] XIP:[id number] DOB: Jul 11, 1956 DOA: 10/21/2017  PCP: Henderson Baltimore, FNP  Admit date: 10/21/2017 Discharge date: 10/24/2017  Admitted From: Home Disposition: Home  Recommendations for Outpatient Follow-up:  1. Follow up with PCP in 1-2 weeks 2. Please obtain BMP/CBC in one week 3. Please check your blood pressure 3 times a week and write it down.  Home Health: No Equipment/Devices: No  Discharge Condition: Stable CODE STATUS: Full Diet recommendation: Heart Healthy /sodium restricted  Brief/Interim Summary:  #) Hypertensive emergency: Patient was admitted with chest tightness, pain, severe hypertension.  She was placed on triglyceride infusion and her oral antihypertensives were increased.  These were uptitrated aggressively until discharge.  She tolerated 24 hours of the nitroglycerin drip and on oral  antihypertensives.  She was discharged on labetalol 300 mg twice a day, HCTZ 50 mg daily, amlodipine 10 mg daily, hydralazine 25 mg every 8 hours.  Echo done on 10/22/2017 showed only grade 1 diastolic dysfunction.  #) Disease headache: Patient developed headache after being on nitroglycerin.  She was given aggressive treatment with Fioricet, promethazine.  She was discharged with a short course of metoclopramide and Fioricet.  Head CT on 10/24/2017 was normal.  NSAIDs were avoided due to her AK I/CKD.  #) Acute on chronic stage III kidney disease: Patient was noted to have mild AK I on CKD.  She had received initially IV furosemide which was discontinued.  Renal ultrasound performed on 10/22/2017 was unremarkable.  #) chronic anemia: Patient was noted to have mild worsening of her chronic anemia.  She received 1 unit packed red blood cells.  She was noted to be mildly iron deficient.  She was given oral iron supplementation on discharge.  She apparently had a normal colonoscopy approximately 6 months ago except for some polyps.  Is likely thought  that her anemia was due to stage III CKD.  #) Bipolar disorder: Patient was continued on divalproex  #) Tardive dyskinesia: This developed as a side effect of a prior antipsychotic.  She was continued on velbenzaine.  She was given only a short course of metoclopramide on discharge for headache.  #) Pain/psych: Patient was continued on gabapentin and mirtazapine.  Discharge Diagnoses:  Active Problems:   Hepatitis C virus infection cured after antiviral drug therapy   Chronic kidney disease, stage III (moderate) (HCC)   History of breast cancer   Morbid obesity (HCC)   Neuroleptic-induced tardive dyskinesia   Chronic low back pain   Hypertensive emergency   Acute diastolic CHF (congestive heart failure) (HCC)   Microcytic anemia    Discharge Instructions  Discharge Instructions    Call MD for:  difficulty breathing, headache or visual disturbances   Complete by:  As directed    Call MD for:  persistant nausea and vomiting   Complete by:  As directed    Call MD for:  redness, tenderness, or signs of infection (pain, swelling, redness, odor or green/yellow discharge around incision site)   Complete by:  As directed    Call MD for:  severe uncontrolled pain   Complete by:  As directed    Call MD for:  temperature >100.4   Complete by:  As directed    Diet - low sodium heart healthy   Complete by:  As directed    Discharge instructions   Complete by:  As directed    Please take your blood pressure 3 times a week at [facility] or [facility].  Please take  your blood pressure medications as prescribed.  Please follow-up with your PCP in 1-2 weeks.  Please follow-up with a kidney doctor in 1-2 weeks.   Increase activity slowly   Complete by:  As directed      Allergies as of 10/24/2017      Reactions   Abilify [aripiprazole]    Tardive dyskinesia Oral   Remeron [mirtazapine]    Wgt stimulation /gain Dizziness Patient says "can tolerate"   Trazodone And Nefazodone    Nightmares/sleep  diturbance   Flexeril [cyclobenzaprine]    Pt states Flexeril makes her feel depressed    Amoxicillin Rash   Has patient had a PCN reaction causing immediate rash, facial/tongue/throat swelling, SOB or lightheadedness with hypotension: Yes Has patient had a PCN reaction causing severe rash involving mucus membranes or skin necrosis: No Has patient had a PCN reaction that required hospitalization: Yes Has patient had a PCN reaction occurring within the last 10 years: yes If all of the above answers are "NO", then may proceed with Cephalosporin use.      Medication List    TAKE these medications   ADULT GUMMY Chew Chew 1 tablet by mouth daily.   Alcohol Prep Pads   amLODipine 10 MG tablet Commonly known as:  NORVASC Take 1 tablet (10 mg total) by mouth daily. Start taking on:  10/25/2017   Bladder Control Pads Ex Absorb Misc   butalbital-acetaminophen-caffeine 50-325-40 MG tablet Commonly known as:  FIORICET, ESGIC Take 1-2 tablets by mouth every 6 (six) hours as needed for headache (not relieved by acetaminophen).   divalproex 500 MG 24 hr tablet Commonly known as:  DEPAKOTE ER Take 1 tablet (500 mg total) by mouth at bedtime. For mood control   ferrous sulfate 325 (65 FE) MG tablet Take 1 tablet (325 mg total) by mouth daily with breakfast. Start taking on:  10/25/2017   gabapentin 400 MG capsule Commonly known as:  NEURONTIN Take 1 capsule (400 mg total) by mouth 3 (three) times daily. For pain and withdrawal symptoms   hydrALAZINE 25 MG tablet Commonly known as:  APRESOLINE Take 1 tablet (25 mg total) by mouth every 8 (eight) hours.   hydrochlorothiazide 50 MG tablet Commonly known as:  HYDRODIURIL Take 1 tablet (50 mg total) by mouth daily. For high blood pressure   hydrOXYzine 25 MG tablet Commonly known as:  ATARAX/VISTARIL Take 1 tablet (25 mg total) by mouth every 6 (six) hours as needed for anxiety.   INGREZZA 80 MG Caps Generic drug:  Valbenazine  Tosylate Take 80 mg by mouth daily.   isosorbide mononitrate 60 MG 24 hr tablet Commonly known as:  IMDUR Take 1 tablet (60 mg total) by mouth daily. Start taking on:  10/25/2017   labetalol 300 MG tablet Commonly known as:  NORMODYNE Take 1 tablet (300 mg total) by mouth 2 (two) times daily. What changed:    medication strength  how much to take  additional instructions   Melatonin 5 MG Lozg Place 1 lozenge under the tongue at bedtime as needed and may repeat dose one time if needed.   metoCLOPramide 10 MG tablet Commonly known as:  REGLAN Take 1 tablet (10 mg total) by mouth 3 (three) times daily with meals.   mirtazapine 15 MG tablet Commonly known as:  REMERON Take 15 mg by mouth at bedtime.   NICODERM CQ 21 mg/24hr patch Generic drug:  nicotine Apply 1 patch topically daily.   pantoprazole 20 MG tablet Commonly known as:  PROTONIX Take 1 tablet (20 mg total) by mouth 2 (two) times daily.   solifenacin 10 MG tablet Commonly known as:  VESICARE Take 10 mg by mouth.   Vitamin B-12 1000 MCG Subl       Allergies  Allergen Reactions  . Abilify [Aripiprazole]     Tardive dyskinesia Oral  . Remeron [Mirtazapine]     Wgt stimulation /gain Dizziness Patient says "can tolerate"  . Trazodone And Nefazodone     Nightmares/sleep diturbance  . Flexeril [Cyclobenzaprine]     Pt states Flexeril makes her feel depressed   . Amoxicillin Rash    Has patient had a PCN reaction causing immediate rash, facial/tongue/throat swelling, SOB or lightheadedness with hypotension: Yes Has patient had a PCN reaction causing severe rash involving mucus membranes or skin necrosis: No Has patient had a PCN reaction that required hospitalization: Yes Has patient had a PCN reaction occurring within the last 10 years: yes If all of the above answers are "NO", then may proceed with Cephalosporin use.     Consultations:  None   Procedures/Studies: Dg Chest 2 View  Result Date:  10/21/2017 CLINICAL DATA:  Shortness of breath and chest pain EXAM: CHEST - 2 VIEW COMPARISON:  July 27, 2017 FINDINGS: There is no edema or consolidation. Heart is borderline enlarged with pulmonary vascularity within normal limits. No adenopathy. There is postoperative change in the lower cervical region. IMPRESSION: Borderline cardiac enlargement.  No edema or consolidation. Electronically Signed   By: Lowella Grip III M.D.   On: 10/21/2017 13:06   Ct Head Wo Contrast  Result Date: 10/24/2017 CLINICAL DATA:  Severe headache for 3 days. Personal history of left breast carcinoma. EXAM: CT HEAD WITHOUT CONTRAST TECHNIQUE: Contiguous axial images were obtained from the base of the skull through the vertex without intravenous contrast. COMPARISON:  None. FINDINGS: Brain: No evidence of acute infarction, hemorrhage, hydrocephalus, extra-axial collection, or mass lesion/mass effect. Mild chronic small vessel disease. Vascular:  No hyperdense vessel or other acute findings. Skull: No evidence of fracture or other significant bone abnormality. Sinuses/Orbits:  No acute findings. Other: None. IMPRESSION: No acute intracranial abnormality. Mild chronic small vessel disease. Electronically Signed   By: Earle Gell M.D.   On: 10/24/2017 09:33   US Renal  Result Date: 10/22/2017 CLINICAL DATA:  Hypertension.  Acute kidney injury.  Hepatitis C. EXAM: RENAL / URINARY TRACT ULTRASOUND COMPLETE COMPARISON:  CT abdomen and pelvis 08/04/2017. FINDINGS: Right Kidney: Length: 12.2 cm. Echogenicity within normal limits. No mass or hydronephrosis visualized. Two subcentimeter cysts are visualized, non worrisome. Left Kidney: Length: 10.8 cm. Echogenicity within normal limits. No mass or hydronephrosis visualized. Multiple cysts are present, with a dominant upper pole cyst measuring up to 3.8 cm in maximum diameter. Bladder: Appears normal for degree of bladder distention. IMPRESSION: No hydronephrosis or significant  abnormal echogenicity. Normal renal size. Electronically Signed   By: Staci Righter M.D.   On: 10/22/2017 08:50    10/22/2017 echo: - Vigorous LV systolic function; mild diastolic dysfunction; mild LVH.     Subjective:   Discharge Exam: Vitals:   10/24/17 0800 10/24/17 0900  BP:  (!) 153/63  Pulse:  (!) 58  Resp:  18  Temp: 98.2 F (36.8 C)   SpO2:  96%   Vitals:   10/24/17 0400 10/24/17 0500 10/24/17 0800 10/24/17 0900  BP:  (!) 147/84  (!) 153/63  Pulse: 74 76  (!) 58  Resp: 17   18  Temp:  98.2 F (36.8 C)   TempSrc:   Oral   SpO2: 97% 94%  96%  Weight:  112.6 kg (248 lb 3.8 oz)    Height:        General exam: Appears calm and comfortable  Respiratory system: No increased work of breathing, no crackles, rales, rhonchi. Cardiovascular system: Distant heart sounds, regular rate and rhythm, no murmurs Gastrointestinal system: Soft, nontender, nondistended, plus bowel sounds Central nervous system: Alert and oriented. No focal neurological deficits.  Lip smacking noted Extremities: 1+ lower extremity edema Skin: No rashes on visible skin Psychiatry: Judgement and insight appear normal. Mood & affect appropriate.      The results of significant diagnostics from this hospitalization (including imaging, microbiology, ancillary and laboratory) are listed below for reference.     Microbiology: Recent Results (from the past 240 hour(s))  MRSA PCR Screening     Status: None   Collection Time: 10/21/17 10:06 PM  Result Value Ref Range Status   MRSA by PCR NEGATIVE NEGATIVE Final    Comment:        The GeneXpert MRSA Assay (FDA approved for NASAL specimens only), is one component of a comprehensive MRSA colonization surveillance program. It is not intended to diagnose MRSA infection nor to guide or monitor treatment for MRSA infections. Performed at [facility], [place] [address], [place], [place] [address]      Labs: BNP (last 3  results) Recent Labs    10/21/17 1254  BNP 094.7*   Basic Metabolic Panel: Recent Labs  Lab 10/21/17 1322 10/22/17 0334 10/23/17 0348 10/24/17 0320  NA 144 141 138 136  K 4.5 4.0 4.3 4.2  CL 108 100* 98* 102  CO2 27 28 25 23   GLUCOSE 92 115* 122* 127*  BUN 24* 33* 41* 55*  CREATININE 1.65* 2.08* 2.71* 2.70*  CALCIUM 9.1 9.2 8.7* 8.4*   Liver Function Tests: Recent Labs  Lab 10/21/17 1322  AST 17  ALT 11*  ALKPHOS 90  BILITOT 0.3  PROT 7.3  ALBUMIN 3.1*   No results for input(s): LIPASE, AMYLASE in the last 168 hours. No results for input(s): AMMONIA in the last 168 hours. CBC: Recent Labs  Lab 10/21/17 1322 10/22/17 0334 10/23/17 0348  WBC 7.6  --  10.7*  NEUTROABS 5.0  --   --   HGB 7.4* 8.8* 8.8*  HCT 25.4* 29.6* 29.9*  MCV 77.0*  --  78.7  PLT 329  --  337   Cardiac Enzymes: No results for input(s): CKTOTAL, CKMB, CKMBINDEX, TROPONINI in the last 168 hours. BNP: Invalid input(s): POCBNP CBG: No results for input(s): GLUCAP in the last 168 hours. D-Dimer No results for input(s): DDIMER in the last 72 hours. Hgb A1c No results for input(s): HGBA1C in the last 72 hours. Lipid Profile No results for input(s): CHOL, HDL, LDLCALC, TRIG, CHOLHDL, LDLDIRECT in the last 72 hours. Thyroid function studies Recent Labs    10/21/17 1920  TSH 0.882   Anemia work up Recent Labs    10/21/17 1920  VITAMINB12 1,545*  FOLATE 9.6  FERRITIN 8*  TIBC 458*  IRON 34  RETICCTPCT 2.1   Urinalysis    Component Value Date/Time   COLORURINE YELLOW 08/18/2017 1832   APPEARANCEUR CLEAR 08/18/2017 1832   LABSPEC 1.013 08/18/2017 1832   PHURINE 5.0 08/18/2017 1832   GLUCOSEU NEGATIVE 08/18/2017 1832   HGBUR NEGATIVE 08/18/2017 1832   BILIRUBINUR NEGATIVE 08/18/2017 1832   KETONESUR NEGATIVE 08/18/2017 1832  PROTEINUR 30 (A) 08/18/2017 1832   UROBILINOGEN 0.2 01/12/2010 1941   NITRITE NEGATIVE 08/18/2017 1832   LEUKOCYTESUR NEGATIVE 08/18/2017 1832    Sepsis Labs Invalid input(s): PROCALCITONIN,  WBC,  LACTICIDVEN Microbiology Recent Results (from the past 240 hour(s))  MRSA PCR Screening     Status: None   Collection Time: 10/21/17 10:06 PM  Result Value Ref Range Status   MRSA by PCR NEGATIVE NEGATIVE Final    Comment:        The GeneXpert MRSA Assay (FDA approved for NASAL specimens only), is one component of a comprehensive MRSA colonization surveillance program. It is not intended to diagnose MRSA infection nor to guide or monitor treatment for MRSA infections. Performed at [facility], [place] [address], [place], [place] [address]      Time coordinating discharge: Over 30 minutes  SIGNED:   Cristy Folks, MD  [facility] 10/24/2017, 11:45 AM   If 7PM-7AM, please contact night-coverage [email or web address] Password [email or web address]

## 2017-10-24 NOTE — Progress Notes (Signed)
Pt asked about her missing bottom dentures that she did not come up from the ED with.  ED was called and none have been found.  Charge nurse notified.  Patient was made aware that the hospital is not responsible for any belongings but that she can call back to see if any have been found in a ocuple of days.  [facility] was also asked if they have found any and none have been found.

## 2017-10-24 NOTE — Progress Notes (Signed)
Social worker provided a Chief Technology Officer for transport.

## 2017-10-24 NOTE — Plan of Care (Signed)
Patient ambulated in hall two times.  She continued to complain of head ache through night but was limited what meds could be given due to ckd 3.  Hypertension managed on PO meds.  She had little sleep through night.

## 2017-10-24 NOTE — Progress Notes (Signed)
Pt bathed

## 2017-10-24 NOTE — Progress Notes (Signed)
Dr. Completed paper work for [employer] for transport on 4/12.  Paper given to patient

## 2017-10-24 NOTE — Progress Notes (Signed)
Pt to CT scan with RN/tele.

## 2017-10-25 ENCOUNTER — Encounter (HOSPITAL_COMMUNITY): Payer: Self-pay | Admitting: Psychology

## 2017-10-25 ENCOUNTER — Other Ambulatory Visit (HOSPITAL_COMMUNITY): Payer: Medicare HMO | Admitting: Psychology

## 2017-10-25 DIAGNOSIS — Z62810 Personal history of physical and sexual abuse in childhood: Secondary | ICD-10-CM

## 2017-10-25 DIAGNOSIS — F1120 Opioid dependence, uncomplicated: Secondary | ICD-10-CM

## 2017-10-25 DIAGNOSIS — F1020 Alcohol dependence, uncomplicated: Secondary | ICD-10-CM
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
## 2017-10-26 ENCOUNTER — Encounter (HOSPITAL_COMMUNITY): Payer: Self-pay | Admitting: Psychology
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
## 2017-10-27 ENCOUNTER — Other Ambulatory Visit (INDEPENDENT_AMBULATORY_CARE_PROVIDER_SITE_OTHER): Payer: Medicare HMO | Admitting: Psychology

## 2017-10-27 ENCOUNTER — Encounter (HOSPITAL_COMMUNITY): Payer: Self-pay | Admitting: Medical

## 2017-10-27 VITALS — BP 169/85 | HR 74 | Ht 62.0 in | Wt 250.0 lb

## 2017-10-27 DIAGNOSIS — F1994 Other psychoactive substance use, unspecified with psychoactive substance-induced mood disorder: Secondary | ICD-10-CM | POA: Diagnosis present

## 2017-10-27 DIAGNOSIS — Z9189 Other specified personal risk factors, not elsewhere classified: Secondary | ICD-10-CM | POA: Diagnosis not present

## 2017-10-27 DIAGNOSIS — F4381 Prolonged grief disorder: Secondary | ICD-10-CM

## 2017-10-27 DIAGNOSIS — G2401 Drug induced subacute dyskinesia: Secondary | ICD-10-CM

## 2017-10-27 DIAGNOSIS — F4321 Adjustment disorder with depressed mood: Secondary | ICD-10-CM

## 2017-10-27 DIAGNOSIS — Z62810 Personal history of physical and sexual abuse in childhood: Secondary | ICD-10-CM

## 2017-10-27 DIAGNOSIS — N183 Chronic kidney disease, stage 3 unspecified: Secondary | ICD-10-CM

## 2017-10-27 DIAGNOSIS — M5440 Lumbago with sciatica, unspecified side: Secondary | ICD-10-CM

## 2017-10-27 DIAGNOSIS — F4312 Post-traumatic stress disorder, chronic: Secondary | ICD-10-CM

## 2017-10-27 DIAGNOSIS — I10 Essential (primary) hypertension: Secondary | ICD-10-CM | POA: Diagnosis not present

## 2017-10-27 DIAGNOSIS — G8929 Other chronic pain: Secondary | ICD-10-CM

## 2017-10-27 DIAGNOSIS — I809 Phlebitis and thrombophlebitis of unspecified site: Secondary | ICD-10-CM

## 2017-10-27 DIAGNOSIS — Z8659 Personal history of other mental and behavioral disorders: Secondary | ICD-10-CM

## 2017-10-27 DIAGNOSIS — Z8619 Personal history of other infectious and parasitic diseases: Secondary | ICD-10-CM

## 2017-10-27 DIAGNOSIS — F1120 Opioid dependence, uncomplicated: Secondary | ICD-10-CM

## 2017-10-27 DIAGNOSIS — T43505A Adverse effect of unspecified antipsychotics and neuroleptics, initial encounter: Secondary | ICD-10-CM

## 2017-10-27 DIAGNOSIS — F1020 Alcohol dependence, uncomplicated: Secondary | ICD-10-CM

## 2017-10-27 DIAGNOSIS — Z853 Personal history of malignant neoplasm of breast: Secondary | ICD-10-CM

## 2017-10-27 DIAGNOSIS — T801XXA Vascular complications following infusion, transfusion and therapeutic injection, initial encounter: Secondary | ICD-10-CM

## 2017-10-27 DIAGNOSIS — F4329 Adjustment disorder with other symptoms: Secondary | ICD-10-CM

## 2017-10-27 DIAGNOSIS — I5031 Acute diastolic (congestive) heart failure: Secondary | ICD-10-CM

## 2017-10-27 DIAGNOSIS — K2920 Alcoholic gastritis without bleeding: Secondary | ICD-10-CM
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
## 2017-10-28 ENCOUNTER — Other Ambulatory Visit (HOSPITAL_COMMUNITY): Payer: Self-pay | Admitting: Medical

## 2017-10-28 ENCOUNTER — Other Ambulatory Visit (HOSPITAL_COMMUNITY): Payer: Medicare HMO | Admitting: Psychology

## 2017-10-28 DIAGNOSIS — F1120 Opioid dependence, uncomplicated: Secondary | ICD-10-CM

## 2017-10-28 DIAGNOSIS — I809 Phlebitis and thrombophlebitis of unspecified site: Secondary | ICD-10-CM

## 2017-10-28 DIAGNOSIS — F1994 Other psychoactive substance use, unspecified with psychoactive substance-induced mood disorder: Secondary | ICD-10-CM | POA: Diagnosis present

## 2017-10-28 DIAGNOSIS — T801XXA Vascular complications following infusion, transfusion and therapeutic injection, initial encounter: Secondary | ICD-10-CM

## 2017-10-28 DIAGNOSIS — F1020 Alcohol dependence, uncomplicated: Secondary | ICD-10-CM | POA: Diagnosis not present

## 2017-10-28 DIAGNOSIS — F4312 Post-traumatic stress disorder, chronic: Secondary | ICD-10-CM

## 2017-10-28 HISTORY — DX: Vascular complications following infusion, transfusion and therapeutic injection, initial encounter: T80.1XXA

## 2017-10-28 HISTORY — DX: Phlebitis and thrombophlebitis of unspecified site: I80.9

## 2017-11-01 ENCOUNTER — Other Ambulatory Visit (HOSPITAL_BASED_OUTPATIENT_CLINIC_OR_DEPARTMENT_OTHER): Payer: Medicare HMO | Admitting: Psychology

## 2017-11-01 ENCOUNTER — Encounter (HOSPITAL_COMMUNITY): Payer: Self-pay | Admitting: Medical

## 2017-11-01 DIAGNOSIS — Z853 Personal history of malignant neoplasm of breast: Secondary | ICD-10-CM

## 2017-11-01 DIAGNOSIS — Z9189 Other specified personal risk factors, not elsewhere classified: Secondary | ICD-10-CM

## 2017-11-01 DIAGNOSIS — I809 Phlebitis and thrombophlebitis of unspecified site: Secondary | ICD-10-CM

## 2017-11-01 DIAGNOSIS — Z8659 Personal history of other mental and behavioral disorders: Secondary | ICD-10-CM

## 2017-11-01 DIAGNOSIS — K2920 Alcoholic gastritis without bleeding: Secondary | ICD-10-CM | POA: Diagnosis not present

## 2017-11-01 DIAGNOSIS — G2401 Drug induced subacute dyskinesia: Secondary | ICD-10-CM

## 2017-11-01 DIAGNOSIS — G8929 Other chronic pain: Secondary | ICD-10-CM

## 2017-11-01 DIAGNOSIS — N183 Chronic kidney disease, stage 3 unspecified: Secondary | ICD-10-CM

## 2017-11-01 DIAGNOSIS — F1120 Opioid dependence, uncomplicated: Secondary | ICD-10-CM | POA: Diagnosis not present

## 2017-11-01 DIAGNOSIS — F4312 Post-traumatic stress disorder, chronic: Secondary | ICD-10-CM

## 2017-11-01 DIAGNOSIS — I10 Essential (primary) hypertension: Secondary | ICD-10-CM | POA: Diagnosis not present

## 2017-11-01 DIAGNOSIS — F1994 Other psychoactive substance use, unspecified with psychoactive substance-induced mood disorder: Secondary | ICD-10-CM

## 2017-11-01 DIAGNOSIS — T43505A Adverse effect of unspecified antipsychotics and neuroleptics, initial encounter: Secondary | ICD-10-CM

## 2017-11-01 DIAGNOSIS — F1020 Alcohol dependence, uncomplicated: Secondary | ICD-10-CM

## 2017-11-01 DIAGNOSIS — I5031 Acute diastolic (congestive) heart failure: Secondary | ICD-10-CM

## 2017-11-01 DIAGNOSIS — M5440 Lumbago with sciatica, unspecified side: Secondary | ICD-10-CM

## 2017-11-01 DIAGNOSIS — Z62810 Personal history of physical and sexual abuse in childhood: Secondary | ICD-10-CM | POA: Diagnosis not present

## 2017-11-01 DIAGNOSIS — T801XXA Vascular complications following infusion, transfusion and therapeutic injection, initial encounter: Secondary | ICD-10-CM
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
## 2017-11-03 ENCOUNTER — Other Ambulatory Visit (HOSPITAL_COMMUNITY): Payer: Self-pay | Admitting: Medical

## 2017-11-03 ENCOUNTER — Other Ambulatory Visit (HOSPITAL_COMMUNITY): Payer: Medicare HMO | Admitting: Medical

## 2017-11-03 ENCOUNTER — Encounter (HOSPITAL_COMMUNITY): Payer: Self-pay | Admitting: Emergency Medicine

## 2017-11-03 ENCOUNTER — Other Ambulatory Visit: Payer: Self-pay

## 2017-11-03 ENCOUNTER — Encounter (HOSPITAL_COMMUNITY): Payer: Self-pay | Admitting: Psychology

## 2017-11-03 ENCOUNTER — Encounter (HOSPITAL_COMMUNITY): Payer: Self-pay

## 2017-11-03 ENCOUNTER — Emergency Department (HOSPITAL_COMMUNITY): Payer: Medicare HMO

## 2017-11-03 ENCOUNTER — Encounter (HOSPITAL_COMMUNITY): Payer: Self-pay | Admitting: Licensed Clinical Social Worker

## 2017-11-03 ENCOUNTER — Emergency Department (HOSPITAL_COMMUNITY)
Admission: EM | Admit: 2017-11-03 | Discharge: 2017-11-03 | Discharge status: Against Medical Advice | Payer: Medicare HMO | Attending: Emergency Medicine | Admitting: Emergency Medicine

## 2017-11-03 DIAGNOSIS — Z5321 Procedure and treatment not carried out due to patient leaving prior to being seen by health care provider: Secondary | ICD-10-CM | POA: Diagnosis not present

## 2017-11-03 DIAGNOSIS — F1120 Opioid dependence, uncomplicated: Secondary | ICD-10-CM

## 2017-11-03 DIAGNOSIS — R0602 Shortness of breath: Secondary | ICD-10-CM | POA: Diagnosis present

## 2017-11-03 DIAGNOSIS — F4312 Post-traumatic stress disorder, chronic: Secondary | ICD-10-CM

## 2017-11-03 NOTE — ED Triage Notes (Addendum)
Pt complaint of dizziness and SOB onset this morning 1100. Denies pain, weakness, numbness, or other.
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
## 2017-11-03 NOTE — ED Notes (Signed)
Pt called to take to treatment room  No response  

## 2017-11-03 NOTE — ED Notes (Signed)
Pt called by xray; no answer.

## 2017-11-03 NOTE — ED Notes (Signed)
Pt called x2 to have blood drawn and vitals rechecked with no response.  RN notified.

## 2017-11-03 NOTE — Addendum Note (Signed)
Addended by: Dara Hoyer on: 11/03/2017 01:57 PM   Modules accepted: Orders

## 2017-11-03 NOTE — Progress Notes (Signed)
    Daily Group Progress Note  Program: CD-IOP   11/03/2017 Sherry [name] [id number]  Diagnosis:  Opioid use disorder, severe, dependence (HCC)  Chronic post-traumatic stress disorder (PTSD)   Sobriety Date: 1/30  Group Time: 1-2:30  Participation Level: Active  Behavioral Response: Appropriate and Sharing  Type of Therapy: Process Group  Interventions: CBT and Assertiveness Training  Topic: Patients were active and engaged in process session. Counselors led pt's in discussion concerning their recovery from mind-altering drugs. Emphasis on goals from tx plan and thoughts and feelings processing. 1 Pt graduated successfully and family memers were present for final 15 min of group. UDS results were returned to some members.      Group Time: 2:30-4  Participation Level: Active  Behavioral Response: Appropriate and Sharing  Type of Therapy: Psycho-education Group  Interventions: CBT and Psychosocial Skills: ADL's and Sleep Hygiene  Topic: Patients were active and engaged in group psychoeducation session. Sherry [name], Wellness Director and health educator spoke to group for 1 hour on activities of daily living, w/ an emphasis on improving sleep. A powerpoint was presented. Pts asked questions and some asked for help on improving their sleep habits.     Summary: Pt was active and engaged in group. Pt asked another member whether or not she called her sponsor after having a difficult meeting w/ her mother. Pt was upbeat and smiled throughout group. She reported her nephews came this morning to her house and surprised her w/ a visit which made her very happy. Pt felt comforted since they told her why they do not come around as much as she wants them to. Pt still lives in house of her deceased mother and this is hard for nephews to visit. Pt continued a pattern of dedicating 15 min in the morning to reading a Daily Reflection from NA and "really letting herself meditate on it".  Pt was happy to share that she made her bed upon waking this morning. Pt offered supportive and emotional feedback for graduating member.   UDS collected: No Results: most recent 4/8- negative  AA/NA attended?: No  Sponsor?: Yes   Sherry [name], LPCA LCASA 11/03/2017 8:42 AM

## 2017-11-03 NOTE — Progress Notes (Unsigned)
SPt was scheduled for visit to reveiw discharge but upon calling for her in group room she had c/o not feeling well. "I feel like Im going to pass out" Recently hospitalized for acute CHF  and Stage 3 ESRF.Scheduled to graduate CD IOP tomorrow.Says sh ate BF at 10 am. Was drinking diet Mt Dew when symptoms started trying to be healthier. Obese without Diabetes . Clean and sober since [facility] Hospitalization in February O- tremulous ill appearing BF unsteady on feet.At nurse's staion BP 158/88 P 67 regular.Unable to test BS.No obvious focal neuro deficit except tongue protrusion from TD. A- Acute episode of vertigo/malaise ? Etiology P-Sending to ED for assessment.

## 2017-11-04 ENCOUNTER — Other Ambulatory Visit (HOSPITAL_COMMUNITY): Payer: Medicare HMO | Admitting: Psychology

## 2017-11-08 ENCOUNTER — Other Ambulatory Visit: Payer: Self-pay

## 2017-11-08 ENCOUNTER — Ambulatory Visit (INDEPENDENT_AMBULATORY_CARE_PROVIDER_SITE_OTHER): Payer: Medicare HMO | Admitting: Family Medicine

## 2017-11-08 ENCOUNTER — Other Ambulatory Visit (HOSPITAL_COMMUNITY): Payer: Medicare HMO

## 2017-11-08 ENCOUNTER — Encounter: Payer: Self-pay | Admitting: Family Medicine

## 2017-11-08 VITALS — BP 138/78 | HR 70 | Temp 98.0°F | Resp 16 | Ht 62.99 in | Wt 247.0 lb

## 2017-11-08 DIAGNOSIS — N183 Chronic kidney disease, stage 3 unspecified: Secondary | ICD-10-CM

## 2017-11-08 DIAGNOSIS — I10 Essential (primary) hypertension: Secondary | ICD-10-CM

## 2017-11-08 DIAGNOSIS — M5441 Lumbago with sciatica, right side: Secondary | ICD-10-CM

## 2017-11-08 DIAGNOSIS — D509 Iron deficiency anemia, unspecified: Secondary | ICD-10-CM

## 2017-11-08 DIAGNOSIS — G8929 Other chronic pain: Secondary | ICD-10-CM

## 2017-11-08 MED ORDER — GABAPENTIN 800 MG PO TABS: 800.0000 mg | ORAL_TABLET | Freq: Three times a day (TID) | ORAL | 3 refills | Status: DC

## 2017-11-08 MED ORDER — FERROUS SULFATE 325 (65 FE) MG PO TABS: 325.0000 mg | ORAL_TABLET | Freq: Two times a day (BID) | ORAL | 3 refills | Status: DC

## 2017-11-08 NOTE — Patient Instructions (Signed)
     IF you received an x-ray today, you will receive an invoice from [facility]. Please contact [facility] at [phone] with questions or concerns regarding your invoice.   IF you received labwork today, you will receive an invoice from [facility]. Please contact [facility] at [phone] with questions or concerns regarding your invoice.   Our billing staff will not be able to assist you with questions regarding bills from these companies.  You will be contacted with the lab results as soon as they are available. The fastest way to get your results is to activate your My Chart account. Instructions are located on the last page of this paperwork. If you have not heard from us regarding the results in 2 weeks, please contact this office.     

## 2017-11-08 NOTE — Progress Notes (Signed)
Chief Complaint  Patient presents with  . Establish Care    pt need to a PCP for Chronic Conditions     HPI   Patient is here to establish care and to get a referral for Nephrologist for her stage 3 CKD  Chronic Low Back Pain She is taking gabapentin for chronic back pain She is currently taking gabapentin 600mg  tid She reports that it is not effective and was previously on 800mg  gabapentin and was decreased to 600mg  over a year but notes that it is not as effective as the 800mg  She reports that her back pain is midline  Chronic Fatigue She reports that she takes B12 due to B12 deficiency She eats meat She also eats vegetables but admits that not as much as she should She had low energy so she assumed it was her B12 Lab Results  Component Value Date   VITAMINB12 1,545 (H) 10/21/2017   Chronic microcytic Anemia She reports that she has anemia from the hospital 3 weeks ago  She is taking iron for her anemia Lab Results  Component Value Date   WBC 10.7 (H) 10/23/2017   HGB 8.8 (L) 10/23/2017   HCT 29.9 (L) 10/23/2017   MCV 78.7 10/23/2017   PLT 337 10/23/2017    Essential Hypertension  BP Readings from Last 3 Encounters:  11/08/17 138/78  11/03/17 136/70  10/24/17 (!) 144/60   Patient reports that she has been having  She has hypertension and was admitted 2 weeks ago for hypertensive emergency She does not monitor her home bp with a cuff because she does not check at home She has chronic fatigue and dizziness that is positional intermittently She denies chest pain, lower extremity edema She is on amlodipine, hydralazine and imdur for hypertension  Chronic kidney disease She does not take NSAIDs She is taking her bp meds She has had CKD stage 3  She does not take any meds that would worsen her renal function She would like to see a Nephrologist to establish care Last creatinine 1.73 (care everywhere 03/12/2017) Creatinine 2.16 (care everywhere 08/03/2017)  Past  Medical History:  Diagnosis Date  . Allergy   . Anxiety   . Cancer ([place])   . CHF (congestive heart failure) ([place])   . Chronic back pain   . Chronic kidney disease   . Depression (emotion)   . Hepatitis C   . Hypertension   . Opioid dependence ([place])    in [facility]    Current Outpatient Medications  Medication Sig Dispense Refill  . Alcohol Swabs (ALCOHOL PREP) PADS     . amLODipine (NORVASC) 10 MG tablet Take 1 tablet (10 mg total) by mouth daily. 30 tablet 1  . Cyanocobalamin (VITAMIN B-12) 1000 MCG SUBL     . divalproex (DEPAKOTE ER) 500 MG 24 hr tablet TK 1 T PO HS FOR MOOD CONTROL  2  . ferrous sulfate 325 (65 FE) MG tablet Take 1 tablet (325 mg total) by mouth 2 (two) times daily with a meal. 60 tablet 3  . gabapentin (NEURONTIN) 800 MG tablet Take 1 tablet (800 mg total) by mouth 3 (three) times daily. 90 tablet 3  . hydrALAZINE (APRESOLINE) 25 MG tablet Take 1 tablet (25 mg total) by mouth every 8 (eight) hours. 90 tablet 1  . hydrochlorothiazide (HYDRODIURIL) 50 MG tablet Take 1 tablet (50 mg total) by mouth daily. For high blood pressure 30 tablet 0  . hydrOXYzine (ATARAX/VISTARIL) 25 MG tablet Take 1 tablet (  25 mg total) by mouth every 6 (six) hours as needed for anxiety. 30 tablet 2  . Incontinence Supply Disposable (BLADDER CONTROL PADS EX ABSORB) MISC     . INGREZZA 80 MG CAPS Take 80 mg by mouth daily.    . isosorbide mononitrate (IMDUR) 60 MG 24 hr tablet Take 1 tablet (60 mg total) by mouth daily. 30 tablet 1  . labetalol (NORMODYNE) 300 MG tablet Take 1 tablet (300 mg total) by mouth 2 (two) times daily. 60 tablet 1  . Melatonin 5 MG LOZG Place 1 lozenge under the tongue at bedtime as needed and may repeat dose one time if needed. 90 lozenge 5  . metoCLOPramide (REGLAN) 10 MG tablet Take 1 tablet (10 mg total) by mouth 3 (three) times daily with meals. 20 tablet 0  . mirtazapine (REMERON) 15 MG tablet Take 15 mg by mouth at bedtime.  3  . Multiple  Vitamins-Minerals (ADULT GUMMY) CHEW Chew 1 tablet by mouth daily.    Marland Kitchen NICODERM CQ 21 MG/24HR patch Apply 1 patch topically daily.    Marland Kitchen omega-3 fish oil (MAXEPA) 1000 MG CAPS capsule     . pantoprazole (PROTONIX) 20 MG tablet Take 1 tablet (20 mg total) by mouth 2 (two) times daily. 60 tablet 0  . solifenacin (VESICARE) 10 MG tablet Take 10 mg by mouth.    . divalproex (DEPAKOTE ER) 500 MG 24 hr tablet Take 1 tablet (500 mg total) by mouth at bedtime. For mood control 30 tablet 2   No current facility-administered medications for this visit.     Allergies:  Allergies  Allergen Reactions  . Abilify [Aripiprazole]     Tardive dyskinesia Oral  . Remeron [Mirtazapine]     Wgt stimulation /gain Dizziness Patient says "can tolerate"  . Trazodone And Nefazodone     Nightmares/sleep diturbance  . Flexeril [Cyclobenzaprine]     Pt states Flexeril makes her feel depressed   . Amoxicillin Rash    Has patient had a PCN reaction causing immediate rash, facial/tongue/throat swelling, SOB or lightheadedness with hypotension: Yes Has patient had a PCN reaction causing severe rash involving mucus membranes or skin necrosis: No Has patient had a PCN reaction that required hospitalization: Yes Has patient had a PCN reaction occurring within the last 10 years: yes If all of the above answers are "NO", then may proceed with Cephalosporin use.     Past Surgical History:  Procedure Laterality Date  . ABDOMINAL HYSTERECTOMY    . BACK SURGERY    . CHOLECYSTECTOMY    . MYOMECTOMY      Social History   Socioeconomic History  . Marital status: Single    Spouse name: Not on file  . Number of children: Not on file  . Years of education: Not on file  . Highest education level: Not on file  Occupational History  . Not on file  Social Needs  . Financial resource strain: Not on file  . Food insecurity:    Worry: Not on file    Inability: Not on file  . Transportation needs:    Medical: Not on  file    Non-medical: Not on file  Tobacco Use  . Smoking status: Current Every Day Smoker    Packs/day: 1.00    Types: Cigarettes  . Smokeless tobacco: Never Used  Substance and Sexual Activity  . Alcohol use: No  . Drug use: No  . Sexual activity: Not on file  Lifestyle  . Physical activity:  Days per week: Not on file    Minutes per session: Not on file  . Stress: Not on file  Relationships  . Social connections:    Talks on phone: Not on file    Gets together: Not on file    Attends religious service: Not on file    Active member of club or organization: Not on file    Attends meetings of clubs or organizations: Not on file    Relationship status: Not on file  Other Topics Concern  . Not on file  Social History Narrative  . Not on file    Family History  Family history unknown: Yes     ROS Review of Systems See HPI [place]: No fevers or chills No malaise No diaphoresis Skin: No rash or itching Eyes: no blurry vision, no double vision GU: no dysuria or hematuria Neuro: no dizziness or headaches all others reviewed and negative   Objective: Vitals:   11/08/17 1152  BP: 138/78  Pulse: 70  Resp: 16  Temp: 98 F (36.7 C)  TempSrc: Oral  SpO2: 96%  Weight: 247 lb (112 kg)  Height: 5' 2.99" (1.6 m)    Physical Exam  Constitutional: She is oriented to person, place, and time. She appears well-developed and well-nourished.  HENT:  Head: Normocephalic and atraumatic.  Eyes: Conjunctivae and EOM are normal.  Cardiovascular: Normal rate, regular rhythm and normal heart sounds.  No murmur heard. Pulmonary/Chest: Effort normal and breath sounds normal. No stridor. No respiratory distress. She has no wheezes.  Musculoskeletal:       Lumbar back: She exhibits tenderness, bony tenderness and abnormal pulse. She exhibits normal range of motion, no swelling, no edema, no deformity, no laceration, no pain and no spasm.       Back:  Neurological: She is  alert and oriented to person, place, and time.  Skin: Skin is warm. Capillary refill takes less than 2 seconds.  Psychiatric: She has a normal mood and affect. Her behavior is normal. Judgment and thought content normal.    Assessment and Plan Floreen was seen today for establish care.  Diagnoses and all orders for this visit:  Essential hypertension- bp currently at goal, cpm Discussed home bp monitoring -     CBC with Differential/Platelet -     Comprehensive metabolic panel -     Ambulatory referral to Nephrology  Chronic kidney disease, stage III (moderate) ([place])- continue current home bp meds Referral placed for Nephrology -     CBC with Differential/Platelet -     Comprehensive metabolic panel -     Ambulatory referral to Nephrology  Microcytic anemia- increase iron to BID at least Discontinued B12 as this is not a b12 deficieny -     CBC with Differential/Platelet -     ferrous sulfate 325 (65 FE) MG tablet; Take 1 tablet (325 mg total) by mouth 2 (two) times daily with a meal.  Morbid obesity ([place])- enouraged exercise for weight loss  Chronic bilateral low back pain with right-sided sciatica- will increase gabapentin to 800mg  tid Discussed risk of increased drowsiness at increased dose Pt verbalized understanding She knows not to take nsaids due to her renal function -     gabapentin (NEURONTIN) 800 MG tablet; Take 1 tablet (800 mg total) by mouth 3 (three) times daily.   A total of 45 minutes were spent face-to-face with the patient during this encounter and over half of that time was spent on counseling and coordination  of care.   [place]

## 2017-11-09 ENCOUNTER — Encounter (HOSPITAL_COMMUNITY): Payer: Self-pay | Admitting: Psychology

## 2017-11-09 LAB — COMPREHENSIVE METABOLIC PANEL
ALT: 8 IU/L (ref 0–32)
AST: 12 IU/L (ref 0–40)
Albumin/Globulin Ratio: 1.2 (ref 1.2–2.2)
Albumin: 3.7 g/dL (ref 3.6–4.8)
Alkaline Phosphatase: 105 IU/L (ref 39–117)
BUN/Creatinine Ratio: 18 (ref 12–28)
BUN: 37 mg/dL — ABNORMAL HIGH (ref 8–27)
Bilirubin Total: <0.2 mg/dL (ref 0.0–1.2)
CO2: 19 mmol/L — ABNORMAL LOW (ref 20–29)
Calcium: 9.2 mg/dL (ref 8.7–10.3)
Chloride: 107 mmol/L — ABNORMAL HIGH (ref 96–106)
Creatinine, Ser: 2.05 mg/dL — ABNORMAL HIGH (ref 0.57–1.00)
GFR calc Af Amer: 30 mL/min/{1.73_m2} — ABNORMAL LOW (ref >59)
GFR calc non Af Amer: 26 mL/min/{1.73_m2} — ABNORMAL LOW (ref >59)
Globulin, Total: 3.1 g/dL (ref 1.5–4.5)
Glucose: 83 mg/dL (ref 65–99)
Potassium: 4.8 mmol/L (ref 3.5–5.2)
Sodium: 142 mmol/L (ref 134–144)
Total Protein: 6.8 g/dL (ref 6.0–8.5)

## 2017-11-09 LAB — CBC WITH DIFFERENTIAL/PLATELET
Basophils Absolute: 0 10*3/uL (ref 0.0–0.2)
Basos: 0 %
EOS (ABSOLUTE): 0.2 10*3/uL (ref 0.0–0.4)
Eos: 2 %
Hematocrit: 32.2 % — ABNORMAL LOW (ref 34.0–46.6)
Hemoglobin: 9.5 g/dL — ABNORMAL LOW (ref 11.1–15.9)
Immature Grans (Abs): 0 10*3/uL (ref 0.0–0.1)
Immature Granulocytes: 0 %
Lymphocytes Absolute: 1.9 10*3/uL (ref 0.7–3.1)
Lymphs: 21 %
MCH: 23.5 pg — ABNORMAL LOW (ref 26.6–33.0)
MCHC: 29.5 g/dL — ABNORMAL LOW (ref 31.5–35.7)
MCV: 80 fL (ref 79–97)
Monocytes Absolute: 0.4 10*3/uL (ref 0.1–0.9)
Monocytes: 5 %
Neutrophils Absolute: 6.5 10*3/uL (ref 1.4–7.0)
Neutrophils: 72 %
Platelets: 353 10*3/uL (ref 150–379)
RBC: 4.04 x10E6/uL (ref 3.77–5.28)
RDW: 20.7 % — ABNORMAL HIGH (ref 12.3–15.4)
WBC: 9.1 10*3/uL (ref 3.4–10.8)
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
## 2017-11-10 ENCOUNTER — Other Ambulatory Visit (HOSPITAL_COMMUNITY): Payer: Medicare HMO | Attending: Psychiatry | Admitting: Psychology

## 2017-11-10 DIAGNOSIS — F1190 Opioid use, unspecified, uncomplicated: Secondary | ICD-10-CM | POA: Diagnosis not present

## 2017-11-10 DIAGNOSIS — F4312 Post-traumatic stress disorder, chronic: Secondary | ICD-10-CM

## 2017-11-10 DIAGNOSIS — F1020 Alcohol dependence, uncomplicated: Secondary | ICD-10-CM

## 2017-11-10 DIAGNOSIS — F1290 Cannabis use, unspecified, uncomplicated: Secondary | ICD-10-CM | POA: Diagnosis present

## 2017-11-10 DIAGNOSIS — F1120 Opioid dependence, uncomplicated: Secondary | ICD-10-CM

## 2017-11-11 ENCOUNTER — Other Ambulatory Visit (HOSPITAL_COMMUNITY): Payer: Medicare HMO

## 2017-11-11 ENCOUNTER — Encounter (HOSPITAL_COMMUNITY): Payer: Self-pay | Admitting: Psychology

## 2017-11-11 NOTE — Progress Notes (Signed)
    Daily Group Progress Note  Program: CD-IOP   11/11/2017 Sherry [name] [id number]  Diagnosis: Opioid use disorder Cannabis use disorder  Sobriety Date: 08/11/17  Group Time: 1-2:30pm  Participation Level: Active  Behavioral Response: Appropriate and Sharing  Type of Therapy: Process Group  Interventions: Supportive  Topic: Patients were active and engaged in process session.  Patients discussed the fifth step, within the context of the other 12 steps and shared thoughts on the weight and significance of this step.  Patients reflected upon the self-centeredness that often accompanies addiction.  The group celebrated a [employer] graduation and welcomed a new member.  Patients gained knowledge that new group member shared about what he has learned in recovery so far. Counselors collected two UDS.  Group Time: 2:30-4pm  Participation Level: Active  Behavioral Response: Sharing  Type of Therapy: Psycho-education Group  Interventions: Psychosocial Skills: being vulnerable  Topic: Patients were active and engaged in psychoeducation session.  Counselor led a "loving-kindness meditation" and patients processed their reactions.  Counselors led education and discussion around vulnerability.  Patients watched a Ted Talk by Jerelyn Scott, called "The Power of Vulnerability."  Patients reflected on how being vulnerable and recognizing one's self-worth can enhance quality of life.  Patients responded well to psychoeducation and engaged in active discussion.  Summary: Patient was active and talkative in session.  Patient said she walked with a friend up and down the road three times yesterday and plans to take four laps the following day.  Patient went to her new doctor on Monday and received referrals for a kidney specialist and a lung specialist.  Patient provided helpful feedback and affirmation to a new group member.  Counselors and other group members provided words of affirmation and  encouragement to patient on her last day.  Patient shared gratitude for her time in the program and thankfulness for the other group members and counselors.   UDS collected: No Results:   AA/NA attended?: YesFriday, Saturday and Sunday  Sponsor?: Yes   Brandon Melnick, LCAS 11/11/2017 12:08 PM

## 2017-11-15 ENCOUNTER — Other Ambulatory Visit (HOSPITAL_COMMUNITY): Payer: Medicare HMO

## 2017-11-17 ENCOUNTER — Other Ambulatory Visit (HOSPITAL_COMMUNITY): Payer: Medicare HMO

## 2017-11-18 ENCOUNTER — Other Ambulatory Visit (HOSPITAL_COMMUNITY): Payer: Medicare HMO

## 2017-11-22 ENCOUNTER — Other Ambulatory Visit (HOSPITAL_COMMUNITY): Payer: Medicare HMO

## 2017-11-23 ENCOUNTER — Institutional Professional Consult (permissible substitution): Payer: Self-pay | Admitting: Internal Medicine

## 2017-11-24 ENCOUNTER — Other Ambulatory Visit (HOSPITAL_COMMUNITY): Payer: Medicare HMO

## 2017-11-25 ENCOUNTER — Other Ambulatory Visit (HOSPITAL_COMMUNITY): Payer: Medicare HMO

## 2017-11-29 ENCOUNTER — Other Ambulatory Visit (HOSPITAL_COMMUNITY): Payer: Medicare HMO

## 2017-12-01 ENCOUNTER — Encounter: Payer: Self-pay | Admitting: Family Medicine

## 2017-12-01 ENCOUNTER — Other Ambulatory Visit: Payer: Self-pay

## 2017-12-01 ENCOUNTER — Other Ambulatory Visit (HOSPITAL_COMMUNITY): Payer: Medicare HMO

## 2017-12-01 ENCOUNTER — Ambulatory Visit: Payer: Medicare HMO

## 2017-12-01 ENCOUNTER — Ambulatory Visit (INDEPENDENT_AMBULATORY_CARE_PROVIDER_SITE_OTHER): Payer: Medicare HMO | Admitting: Family Medicine

## 2017-12-01 VITALS — BP 172/74 | HR 68 | Temp 97.9°F | Resp 17 | Ht 62.99 in | Wt 257.2 lb

## 2017-12-01 DIAGNOSIS — N183 Chronic kidney disease, stage 3 unspecified: Secondary | ICD-10-CM

## 2017-12-01 DIAGNOSIS — I5031 Acute diastolic (congestive) heart failure: Secondary | ICD-10-CM

## 2017-12-01 DIAGNOSIS — I10 Essential (primary) hypertension: Secondary | ICD-10-CM

## 2017-12-01 DIAGNOSIS — Z23 Encounter for immunization: Secondary | ICD-10-CM

## 2017-12-01 DIAGNOSIS — N3281 Overactive bladder: Secondary | ICD-10-CM

## 2017-12-01 DIAGNOSIS — D509 Iron deficiency anemia, unspecified: Secondary | ICD-10-CM | POA: Diagnosis not present

## 2017-12-01 DIAGNOSIS — M25512 Pain in left shoulder: Secondary | ICD-10-CM | POA: Diagnosis not present

## 2017-12-01 DIAGNOSIS — N179 Acute kidney failure, unspecified: Secondary | ICD-10-CM | POA: Diagnosis not present

## 2017-12-01 MED ORDER — SOLIFENACIN SUCCINATE 10 MG PO TABS: 10.0000 mg | ORAL_TABLET | Freq: Every day | ORAL | 3 refills | Status: DC

## 2017-12-01 NOTE — Progress Notes (Signed)
Chief Complaint  Patient presents with  . left shoulder pain x 1 month    pain level 8.5/10, using aspercreme and icy hot  with no relief,  per pt feels like there is a knot in her neck and pain radiates down arm into hand.    HPI   She reports a month history of left shoulder pain that radiates down to her hand The pain is throbbing in character and is getting worse She is right handed Denies previous injury She tried otc rubs   Acute on Chronic Kidney Disease This is a patient with acute kidney disease in the presence of CKD Her creatinine was down trending at the last visit She denies using any NSAIDs She only uses topical rubs for pain She denies alcohol use She reports that she is compliant with her medications She has chronic anemia as well   Essential Hypertension  Pt reports that she has been taking all her bp meds She has shoulder pain on the left but no chest pain She was in the hospital back in April for Hypertensive Emergency that has now resolved.  She denies headaches, palpitations or shortness of breath BP Readings from Last 3 Encounters:  12/01/17 (!) 172/74  11/08/17 138/78  11/03/17 136/70     Overactive Bladder She reports that she was using vesicare for overactive bladder but ran out of her medication She would like to get a medication refill She reports that she has some incontinence with coughing or sneezing or laughing but mostly urinary frequency from the overactive bladder She is also on HCTZ for blood pressure      Past Medical History:  Diagnosis Date  . Allergy   . Anxiety   . Cancer ([place])   . CHF (congestive heart failure) ([place])   . Chronic back pain   . Chronic kidney disease   . Depression (emotion)   . Hepatitis C   . Hypertension   . Opioid dependence ([place])    in [facility]    Current Outpatient Medications  Medication Sig Dispense Refill  . amLODipine (NORVASC) 10 MG tablet Take 1 tablet (10 mg total) by mouth daily.  30 tablet 1  . ferrous sulfate 325 (65 FE) MG tablet Take 1 tablet (325 mg total) by mouth 2 (two) times daily with a meal. 60 tablet 3  . gabapentin (NEURONTIN) 800 MG tablet Take 1 tablet (800 mg total) by mouth 3 (three) times daily. 90 tablet 3  . INGREZZA 80 MG CAPS Take 80 mg by mouth daily.    . isosorbide mononitrate (IMDUR) 60 MG 24 hr tablet Take 1 tablet (60 mg total) by mouth daily. 30 tablet 1  . labetalol (NORMODYNE) 300 MG tablet Take 1 tablet (300 mg total) by mouth 2 (two) times daily. 60 tablet 1  . metoCLOPramide (REGLAN) 10 MG tablet Take 1 tablet (10 mg total) by mouth 3 (three) times daily with meals. 20 tablet 0  . mirtazapine (REMERON) 15 MG tablet Take 15 mg by mouth at bedtime.  3  . Multiple Vitamins-Minerals (ADULT GUMMY) CHEW Chew 1 tablet by mouth daily.    . pantoprazole (PROTONIX) 20 MG tablet Take 1 tablet (20 mg total) by mouth 2 (two) times daily. 60 tablet 0  . Alcohol Swabs (ALCOHOL PREP) PADS     . Cyanocobalamin (VITAMIN B-12) 1000 MCG SUBL     . divalproex (DEPAKOTE ER) 500 MG 24 hr tablet Take 1 tablet (500 mg total) by mouth at  bedtime. For mood control 30 tablet 2  . divalproex (DEPAKOTE ER) 500 MG 24 hr tablet TK 1 T PO HS FOR MOOD CONTROL  2  . hydrALAZINE (APRESOLINE) 25 MG tablet Take 1 tablet (25 mg total) by mouth every 8 (eight) hours. (Patient not taking: Reported on 12/01/2017) 90 tablet 1  . hydrochlorothiazide (HYDRODIURIL) 50 MG tablet Take 1 tablet (50 mg total) by mouth daily. For high blood pressure (Patient not taking: Reported on 12/01/2017) 30 tablet 0  . hydrOXYzine (ATARAX/VISTARIL) 25 MG tablet Take 1 tablet (25 mg total) by mouth every 6 (six) hours as needed for anxiety. (Patient not taking: Reported on 12/01/2017) 30 tablet 2  . Incontinence Supply Disposable (BLADDER CONTROL PADS EX ABSORB) MISC     . Melatonin 5 MG LOZG Place 1 lozenge under the tongue at bedtime as needed and may repeat dose one time if needed. (Patient not taking:  Reported on 12/01/2017) 90 lozenge 5  . NICODERM CQ 21 MG/24HR patch Apply 1 patch topically daily.    Marland Kitchen omega-3 fish oil (MAXEPA) 1000 MG CAPS capsule     . solifenacin (VESICARE) 10 MG tablet Take 1 tablet (10 mg total) by mouth daily. 90 tablet 3   No current facility-administered medications for this visit.     Allergies:  Allergies  Allergen Reactions  . Abilify [Aripiprazole]     Tardive dyskinesia Oral  . Remeron [Mirtazapine]     Wgt stimulation /gain Dizziness Patient says "can tolerate"  . Trazodone And Nefazodone     Nightmares/sleep diturbance  . Flexeril [Cyclobenzaprine]     Pt states Flexeril makes her feel depressed   . Amoxicillin Rash    Has patient had a PCN reaction causing immediate rash, facial/tongue/throat swelling, SOB or lightheadedness with hypotension: Yes Has patient had a PCN reaction causing severe rash involving mucus membranes or skin necrosis: No Has patient had a PCN reaction that required hospitalization: Yes Has patient had a PCN reaction occurring within the last 10 years: yes If all of the above answers are "NO", then may proceed with Cephalosporin use.     Past Surgical History:  Procedure Laterality Date  . ABDOMINAL HYSTERECTOMY    . BACK SURGERY    . CHOLECYSTECTOMY    . MYOMECTOMY      Social History   Socioeconomic History  . Marital status: Single    Spouse name: Not on file  . Number of children: Not on file  . Years of education: Not on file  . Highest education level: Not on file  Occupational History  . Not on file  Social Needs  . Financial resource strain: Not on file  . Food insecurity:    Worry: Not on file    Inability: Not on file  . Transportation needs:    Medical: Not on file    Non-medical: Not on file  Tobacco Use  . Smoking status: Current Every Day Smoker    Packs/day: 1.00    Types: Cigarettes  . Smokeless tobacco: Never Used  Substance and Sexual Activity  . Alcohol use: No  . Drug use: No    . Sexual activity: Not on file  Lifestyle  . Physical activity:    Days per week: Not on file    Minutes per session: Not on file  . Stress: Not on file  Relationships  . Social connections:    Talks on phone: Not on file    Gets together: Not on file  Attends religious service: Not on file    Active member of club or organization: Not on file    Attends meetings of clubs or organizations: Not on file    Relationship status: Not on file  Other Topics Concern  . Not on file  Social History Narrative  . Not on file    Family History  Family history unknown: Yes     ROS Review of Systems See HPI [place]: No fevers or chills No malaise No diaphoresis Skin: No rash or itching Eyes: no blurry vision, no double vision GU: no dysuria or hematuria Neuro: no dizziness or headaches all others reviewed and negative   Objective: Vitals:   12/01/17 1122  BP: (!) 172/74  Pulse: 68  Resp: 17  Temp: 97.9 F (36.6 C)  TempSrc: Oral  SpO2: 100%  Weight: 257 lb 3.2 oz (116.7 kg)  Height: 5' 2.99" (1.6 m)    Physical Exam  Constitutional: She is oriented to person, place, and time. She appears well-developed and well-nourished.  HENT:  Head: Normocephalic and atraumatic.  Eyes: Conjunctivae and EOM are normal.  Cardiovascular: Normal rate, regular rhythm and normal heart sounds.  No murmur heard. Pulmonary/Chest: Effort normal and breath sounds normal. No stridor. No respiratory distress.  Musculoskeletal:  Left shoulder with AC joint tenderness.  Decrease abduction, unable to go past 60-90 degrees  Neurological: She is alert and oriented to person, place, and time.    Assessment and Plan Nysa was seen today for left shoulder pain x 1 month.  Diagnoses and all orders for this visit:  Essential hypertension- bp elevated Concerning that this might be due to pain  Acute diastolic CHF (congestive heart failure) (H[facility]) -   Pt should follow up with Cardiology as  instructed  Microcytic anemia- work up in the hospital was negative  -     CBC  AKI (acute kidney injury) ([place]) -     Comprehensive metabolic panel  Chronic kidney disease, stage III (moderate) ([facility]) -     CBC -     Comprehensive metabolic panel -   Continue to avoid NSAIDs -   Transfused 1 unit packed RBCs and iron supplementation was to be advised  Need for Tdap vaccination -     Tdap vaccine greater than or equal to 7yo IM  Acute pain of left shoulder- will order xray Pt with evidence of adhesive capsulitis -     DG Shoulder Left; Future  Overactive bladder- refilled vesicare today -     solifenacin (VESICARE) 10 MG tablet; Take 1 tablet (10 mg total) by mouth daily.   A total of 50 minutes were spent face-to-face with the patient during this encounter and over half of that time was spent on counseling and coordination of care.   [place]

## 2017-12-01 NOTE — Patient Instructions (Signed)
     IF you received an x-ray today, you will receive an invoice from [facility]. Please contact [facility] at [phone] with questions or concerns regarding your invoice.   IF you received labwork today, you will receive an invoice from [facility]. Please contact [facility] at [phone] with questions or concerns regarding your invoice.   Our billing staff will not be able to assist you with questions regarding bills from these companies.  You will be contacted with the lab results as soon as they are available. The fastest way to get your results is to activate your My Chart account. Instructions are located on the last page of this paperwork. If you have not heard from us regarding the results in 2 weeks, please contact this office.     

## 2017-12-02 ENCOUNTER — Encounter: Payer: Self-pay | Admitting: Family Medicine

## 2017-12-02 ENCOUNTER — Other Ambulatory Visit (HOSPITAL_COMMUNITY): Payer: Medicare HMO

## 2017-12-02 LAB — COMPREHENSIVE METABOLIC PANEL
ALT: 13 IU/L (ref 0–32)
AST: 16 IU/L (ref 0–40)
Albumin/Globulin Ratio: 1.3 (ref 1.2–2.2)
Albumin: 4.3 g/dL (ref 3.6–4.8)
Alkaline Phosphatase: 114 IU/L (ref 39–117)
BUN/Creatinine Ratio: 20 (ref 12–28)
BUN: 38 mg/dL — ABNORMAL HIGH (ref 8–27)
Bilirubin Total: <0.2 mg/dL (ref 0.0–1.2)
CO2: 19 mmol/L — ABNORMAL LOW (ref 20–29)
Calcium: 10 mg/dL (ref 8.7–10.3)
Chloride: 105 mmol/L (ref 96–106)
Creatinine, Ser: 1.86 mg/dL — ABNORMAL HIGH (ref 0.57–1.00)
GFR calc Af Amer: 33 mL/min/{1.73_m2} — ABNORMAL LOW (ref >59)
GFR calc non Af Amer: 29 mL/min/{1.73_m2} — ABNORMAL LOW (ref >59)
Globulin, Total: 3.2 g/dL (ref 1.5–4.5)
Glucose: 80 mg/dL (ref 65–99)
Potassium: 5.2 mmol/L (ref 3.5–5.2)
Sodium: 140 mmol/L (ref 134–144)
Total Protein: 7.5 g/dL (ref 6.0–8.5)

## 2017-12-02 LAB — CBC
Hematocrit: 34.4 % (ref 34.0–46.6)
Hemoglobin: 10.3 g/dL — ABNORMAL LOW (ref 11.1–15.9)
MCH: 23.7 pg — ABNORMAL LOW (ref 26.6–33.0)
MCHC: 29.9 g/dL — ABNORMAL LOW (ref 31.5–35.7)
MCV: 79 fL (ref 79–97)
Platelets: 310 10*3/uL (ref 150–450)
RBC: 4.34 x10E6/uL (ref 3.77–5.28)
RDW: 22.3 % — ABNORMAL HIGH (ref 12.3–15.4)
WBC: 6.8 10*3/uL (ref 3.4–10.8)

## 2017-12-03 ENCOUNTER — Other Ambulatory Visit: Payer: Self-pay | Admitting: Family Medicine

## 2017-12-07 NOTE — Telephone Encounter (Signed)
Amlodipine and Protonix Last OV:11/08/17 Last refill:Amlodipine 10/25/17 30 tab/1 refill; Protonix 09/06/17 60 tab/0 refill-both filled by another provider [id number]: [place], [place] - [id number] AT [place] [phone] (Phone) [phone] (Fax)

## 2017-12-07 NOTE — Telephone Encounter (Signed)
Attempted to call patient and clarify how may pills left of the amlodipine 10 mg tab. Left message for patient to call the office back.

## 2017-12-08 ENCOUNTER — Other Ambulatory Visit (HOSPITAL_COMMUNITY): Payer: Medicare HMO

## 2017-12-08 NOTE — Telephone Encounter (Signed)
Refill request for amlodipine 10 mg #30 with 3 refills approved.  Protonix prescribed by another provider and will send to dr Nolon Rod for approval. Dgaddy, CMA

## 2017-12-08 NOTE — Progress Notes (Signed)
Pt.notified

## 2017-12-09 ENCOUNTER — Ambulatory Visit (INDEPENDENT_AMBULATORY_CARE_PROVIDER_SITE_OTHER): Payer: Medicare HMO | Admitting: Family Medicine

## 2017-12-09 ENCOUNTER — Ambulatory Visit (INDEPENDENT_AMBULATORY_CARE_PROVIDER_SITE_OTHER): Payer: Medicare HMO

## 2017-12-09 ENCOUNTER — Other Ambulatory Visit (HOSPITAL_COMMUNITY): Payer: Medicare HMO

## 2017-12-09 DIAGNOSIS — M19019 Primary osteoarthritis, unspecified shoulder: Secondary | ICD-10-CM

## 2017-12-09 DIAGNOSIS — M25512 Pain in left shoulder: Secondary | ICD-10-CM

## 2017-12-09 NOTE — Telephone Encounter (Signed)
Patient calling because the pharmacy has not yet received med refill for her amlodipine. I called the pharmacy and double check and they have not therefore can the med please be resent to them.

## 2017-12-09 NOTE — Telephone Encounter (Signed)
Pt is checking on the refill on pantoprazole (PROTONIX) 20 MG tablet

## 2017-12-10 MED ORDER — PANTOPRAZOLE SODIUM 20 MG PO TBEC: 20.0000 mg | DELAYED_RELEASE_TABLET | Freq: Two times a day (BID) | ORAL | 3 refills | Status: DC

## 2017-12-10 NOTE — Telephone Encounter (Signed)
Refilled pantoprazole. Looks like amlodipine was also refilled. Please notify the patient.

## 2017-12-13 ENCOUNTER — Ambulatory Visit: Payer: Medicare HMO | Admitting: Family Medicine

## 2017-12-13 ENCOUNTER — Other Ambulatory Visit (HOSPITAL_COMMUNITY): Payer: Medicare HMO

## 2017-12-15 ENCOUNTER — Other Ambulatory Visit (HOSPITAL_COMMUNITY): Payer: Medicare HMO

## 2017-12-16 ENCOUNTER — Other Ambulatory Visit (HOSPITAL_COMMUNITY): Payer: Medicare HMO

## 2017-12-17 ENCOUNTER — Telehealth: Payer: Self-pay | Admitting: *Deleted

## 2017-12-17 NOTE — Telephone Encounter (Signed)
Patient called and left a message needing some information. Patient stated that "I need the doctor's name I am seeing the address." Called the patient back, left a message with the Dr.Phelps name and spelling, along with the address.

## 2017-12-18 ENCOUNTER — Other Ambulatory Visit (HOSPITAL_COMMUNITY): Payer: Self-pay | Admitting: Medical

## 2017-12-20 ENCOUNTER — Other Ambulatory Visit (HOSPITAL_COMMUNITY): Payer: Medicare HMO

## 2017-12-22 ENCOUNTER — Other Ambulatory Visit (HOSPITAL_COMMUNITY): Payer: Medicare HMO

## 2017-12-22 NOTE — Progress Notes (Signed)
Consult Note: Gyn-Onc  Consult was requested by Dr. Newton [name]  CC:  Chief Complaint  Patient presents with  . Enlarged ovary    HPI: Ms. Sherry Sherry [name]  Sherry [name] a very nice 62 y.o. G1 P0-0-1-0  In January 2019 the patient noted significant right lower quadrant pain accompanied by nausea and vomiting.  She presented to [facility] and was referred on to gastroenterology for further work-up with upper endoscopy and colonoscopy.  She states that the pain has now resolved and she no longer has episodes of nausea or vomiting.  She was referred on to gynecology after CT imaging noted a 6 cm pelvic mass.  Dr. Ulanda [name] with the referring GYN performed transvaginal ultrasound 12/08/2017 in his office and noted that the right ovary had 2 cystic avascular well-defined lesions with the overall adnexa measuring 6.5 x 5.9 x 5.5 cm.  The left ovary could not be definitively visualized.  A Ca1 25 was ordered and was normal at 11.3 on 11/29/2017.  Radiology: . 07/25/2016-CT abdomen and pelvis for what Sherry [name] noted as diarrhea and abdominal pain reveals a 5.7 x 5.6 x 6 cm complex cystic or solid lesion.  Pelvic ultrasound was recommended. . 07/30/2017-CT abdomen and pelvis-performed for what Sherry [name] stated as acute abdominal and pelvic pain for 2 days-stable 5.8 x 6.2 cm central pelvic cyst ultrasound follow-up in 1 year was recommended.  Small to moderate supraumbilical ventral hernia containing fat. . 08/04/2017 CT abdomen pelvis performed for what Sherry [name] described as clinical abdominal pain in the epigastric region -reveals an interval increase in size of an adnexal mass in the central pelvis now measuring 7 x 6.6 cm in further characterization with an ultrasound Sherry [name] recommended.  There Sherry [name] a stable left adrenal gland adenoma.  Current Meds:  Outpatient Encounter Medications as of 12/24/2017  Medication Sig  . Alcohol Swabs (ALCOHOL PREP) PADS   . amLODipine (NORVASC) 10 MG tablet TAKE 1 TABLET BY MOUTH EVERY DAY  .  ferrous sulfate 325 (65 FE) MG tablet Take 1 tablet (325 mg total) by mouth 2 (two) times daily with a meal.  . gabapentin (NEURONTIN) 800 MG tablet Take 1 tablet (800 mg total) by mouth 3 (three) times daily.  . hydrALAZINE (APRESOLINE) 25 MG tablet Take 1 tablet (25 mg total) by mouth every 8 (eight) hours.  . hydrochlorothiazide (HYDRODIURIL) 50 MG tablet Take 1 tablet (50 mg total) by mouth daily. For high blood pressure  . Incontinence Supply Disposable (BLADDER CONTROL PADS EX ABSORB) MISC   . INGREZZA 80 MG CAPS Take 80 mg by mouth daily.  . isosorbide mononitrate (IMDUR) 60 MG 24 hr tablet Take 1 tablet (60 mg total) by mouth daily.  Marland Kitchen labetalol (NORMODYNE) 300 MG tablet Take 1 tablet (300 mg total) by mouth 2 (two) times daily.  . metoCLOPramide (REGLAN) 10 MG tablet Take 1 tablet (10 mg total) by mouth 3 (three) times daily with meals.  . Multiple Vitamins-Minerals (ADULT GUMMY) CHEW Chew 1 tablet by mouth daily.  Marland Kitchen omega-3 fish oil (MAXEPA) 1000 MG CAPS capsule   . pantoprazole (PROTONIX) 20 MG tablet Take 1 tablet (20 mg total) by mouth 2 (two) times daily.  . divalproex (DEPAKOTE ER) 500 MG 24 hr tablet Take 1 tablet (500 mg total) by mouth at bedtime. For mood control  . solifenacin (VESICARE) 10 MG tablet Take 1 tablet (10 mg total) by mouth daily. (Patient not taking: Reported on 12/24/2017)  . [DISCONTINUED] Cyanocobalamin (VITAMIN B-12) 1000 MCG SUBL   . [  DISCONTINUED] divalproex (DEPAKOTE ER) 500 MG 24 hr tablet TK 1 T PO HS FOR MOOD CONTROL  . [DISCONTINUED] hydrOXYzine (ATARAX/VISTARIL) 25 MG tablet Take 1 tablet (25 mg total) by mouth every 6 (six) hours as needed for anxiety. (Patient not taking: Reported on 12/01/2017)  . [DISCONTINUED] mirtazapine (REMERON) 15 MG tablet Take 15 mg by mouth at bedtime.  . [DISCONTINUED] NICODERM CQ 21 MG/24HR patch Apply 1 patch topically daily.   No facility-administered encounter medications on file as of 12/24/2017.     Allergy:   Allergies  Allergen Reactions  . Abilify [Aripiprazole]     Tardive dyskinesia Oral  . Remeron [Mirtazapine]     Wgt stimulation /gain Dizziness Patient says "can tolerate"  . Trazodone And Nefazodone     Nightmares/sleep diturbance  . Flexeril [Cyclobenzaprine]     Pt states Flexeril makes her feel depressed   . Amoxicillin Rash    Has patient had a PCN reaction causing immediate rash, facial/tongue/throat swelling, SOB or lightheadedness with hypotension: Yes Has patient had a PCN reaction causing severe rash involving mucus membranes or skin necrosis: No Has patient had a PCN reaction that required hospitalization: Yes Has patient had a PCN reaction occurring within the last 10 years: yes If all of the above answers are "NO", then may proceed with Cephalosporin use.     Social Hx:  Tobacco use: Over 40 pack years and current smoker Alcohol use: None Illicit Drug use: None currently Illicit IV Drug use: Has used in the past.  Past Surgical Hx:  Past Surgical History:  Procedure Laterality Date  . BACK SURGERY  1990  . BREAST SURGERY     "breast cancer survivor" - states partial mastectomy and nodes  . LAPAROSCOPIC CHOLECYSTECTOMY  2002  . MYOMECTOMY     x 2 prior to hysterectomy  . TOTAL ABDOMINAL HYSTERECTOMY     fibroids     Past Medical Hx:  Past Medical History:  Diagnosis Date  . Anxiety   . Bipolar and related disorder ([place])   . Breast cancer ([place])   . CHF (congestive heart failure) ([place])   . Chronic back pain   . Chronic kidney disease   . Hepatitis C   . Hypertension   . MRSA infection    of breast incision  . Opioid dependence ([place])    in [place]    Past Gynecological History:   GYNECOLOGIC HISTORY:  No LMP recorded. Patient has had a hysterectomy. Menarche: 62years old P 0010 LMP in her 62s when she underwent hysterectomy Contraceptive none HRT none  Last Pap not applicable  Family Hx:  Family History  Problem Relation Age of  Onset  . Heart attack Mother   . Breast cancer Mother 54  . Cancer Father 64       died of bleeding from kidneys    Review of Systems:  Review of Systems  Genitourinary: Positive for pelvic pain.   Musculoskeletal: Positive for back pain.  Psychiatric/Behavioral: Positive for depression. The patient Sherry [name] nervous/anxious.   All other systems reviewed and are negative.   Vitals:  Blood pressure (!) 160/83, pulse 63, temperature 98.1 F (36.7 C), temperature source Oral, resp. rate 18, height 5\' 2"  (1.575 m), weight 264 lb 11.2 oz (120.1 kg), SpO2 100 %. Body mass index Sherry [name] 48.41 kg/m.   Physical Exam: ECOG PERFORMANCE STATUS: 1 - Symptomatic but completely ambulatory   General :  OBESE Well developed, 62 y.o., female in no apparent distress HEENT:  Normocephalic/atraumatic, symmetric, EOMI, eyelids normal Neck:   Supple, no masses.  Lymphatics:  No cervical/ submandibular/ supraclavicular/ infraclavicular/ inguinal adenopathy Respiratory:  Respirations unlabored, no use of accessory muscles CV:   Deferred Breast:  Deferred Musculoskeletal: No CVA tenderness, normal muscle strength. Abdomen:  Obese, soft, non-tender and nondistended. No evidence of hernia. No masses. Extremities:  No lymphedema, no erythema, non-tender. Skin:   Normal inspection Neuro/Psych:  No focal motor deficit, no abnormal mental status. Normal gait. Normal affect. Alert and oriented to person, place, and time  Genito Urinary: Vulva: Normal external female genitalia.  Bladder/urethra: Urethral meatus normal in size and location. No lesions or   masses, well supported bladder Speculum exam: Vagina: No lesion, no discharge, no bleeding. Bimanual exam: Cervix/Uterus: Surgically absent  Adnexa: No masses. Limited exam given habitus Rectovaginal:  Good tone, no masses, no cul de sac nodularity, no parametrial involvement or nodularity.   Assessment/Plan: 1. Ovarian cyst o Appears simple on imaging that I  reviewed and has normal Ca1 25 o After the patient departed we were able to see a CT scan from January 2018 and it looks as though this lesion Sherry [name] stable over more than a year and a half now o Plan to repeat ultrasound here at [facility] and return to see me after doing so in a few weeks 2. I recommend against surgical resection at this time.  It appears that she has a benign lesion and Sherry [name] fairly asymptomatic.  She told me the last time she had significant pain was in January 2019. o She has higher surgical morbidity risk given her cardiovascular history and ? Renal disease along with her obesity (BMI of 48) o I worry that an attempt at a laparoscopic removal will not be successful given her history of 2 prior myomectomies and abdominal hysterectomy. i. However if she did have a torsion event in January 2019 and this would argue against significant adhesive disease there Sherry [name] no way to know for certain. 3. If we did proceed to surgery in the future we need to keep her mind that she would need surgical clearance and that she Sherry [name] in rehab for opioid dependence.   Isabel Caprice, MD  12/24/2017, 5:12 PM  Cc: Sherry Pigg, MD (Referring OB/GYN) Delia Chimes, MD (PCP)

## 2017-12-23 ENCOUNTER — Other Ambulatory Visit (HOSPITAL_COMMUNITY): Payer: Medicare HMO

## 2017-12-24 ENCOUNTER — Encounter: Payer: Self-pay | Admitting: Obstetrics

## 2017-12-24 ENCOUNTER — Inpatient Hospital Stay: Payer: Medicare HMO | Attending: Obstetrics | Admitting: Obstetrics

## 2017-12-24 VITALS — BP 160/83 | HR 63 | Temp 98.1°F | Resp 18 | Ht 62.0 in | Wt 264.7 lb

## 2017-12-24 DIAGNOSIS — Z90710 Acquired absence of both cervix and uterus: Secondary | ICD-10-CM | POA: Diagnosis not present

## 2017-12-24 DIAGNOSIS — N83201 Unspecified ovarian cyst, right side: Secondary | ICD-10-CM | POA: Diagnosis present

## 2017-12-24 NOTE — Patient Instructions (Signed)
1. We will repeat your ultrasound mid-July 2. Return here end of July to review results and options

## 2017-12-27 ENCOUNTER — Institutional Professional Consult (permissible substitution): Payer: Self-pay | Admitting: Pulmonary Disease

## 2017-12-27 ENCOUNTER — Other Ambulatory Visit (HOSPITAL_COMMUNITY): Payer: Medicare HMO

## 2017-12-28 ENCOUNTER — Other Ambulatory Visit: Payer: Self-pay | Admitting: Family Medicine

## 2017-12-29 ENCOUNTER — Other Ambulatory Visit (HOSPITAL_COMMUNITY): Payer: Medicare HMO

## 2017-12-30 ENCOUNTER — Encounter: Payer: Self-pay | Admitting: Internal Medicine

## 2017-12-30 ENCOUNTER — Other Ambulatory Visit (HOSPITAL_COMMUNITY): Payer: Medicare HMO

## 2017-12-30 ENCOUNTER — Ambulatory Visit (INDEPENDENT_AMBULATORY_CARE_PROVIDER_SITE_OTHER): Payer: Medicare HMO | Admitting: Internal Medicine

## 2017-12-30 VITALS — BP 126/76 | HR 54 | Ht 62.0 in | Wt 259.0 lb

## 2017-12-30 DIAGNOSIS — R0600 Dyspnea, unspecified: Secondary | ICD-10-CM

## 2017-12-30 DIAGNOSIS — R0609 Other forms of dyspnea: Secondary | ICD-10-CM

## 2017-12-30 DIAGNOSIS — F17210 Nicotine dependence, cigarettes, uncomplicated: Secondary | ICD-10-CM

## 2017-12-30 HISTORY — DX: Other forms of dyspnea: R06.09

## 2017-12-30 HISTORY — DX: Dyspnea, unspecified: R06.00

## 2017-12-30 NOTE — Progress Notes (Signed)
Subjective:    Patient ID: Sherry [name], female    DOB: [birth year], 62 y.o.   MRN: [id number]  HPI   49 yobf active smoker new onset doe x 50 ft x ok at rest and R side down since early 2019 assoc with dtc hbp/ cri    Admit date: 10/21/2017 Discharge date: 10/24/2017  Admitted From: Home Disposition: Home  Recommendations for Outpatient Follow-up:  1. Follow up with PCP in 1-2 weeks 2. Please obtain BMP/CBC in one week 3. Please check your blood pressure 3 times a week and write it down.  Home Health: No Equipment/Devices: No  Discharge Condition: Stable CODE STATUS: Full Diet recommendation: Heart Healthy /sodium restricted  Brief/Interim Summary:  #) Hypertensive emergency: Patient was admitted with chest tightness, pain, severe hypertension.  She was placed on triglyceride infusion and her oral antihypertensives were increased.  These were uptitrated aggressively until discharge.  She tolerated 24 hours of the nitroglycerin drip and on oral  antihypertensives.  She was discharged on labetalol 300 mg twice a day, HCTZ 50 mg daily, amlodipine 10 mg daily, hydralazine 25 mg every 8 hours.  Echo done on 10/22/2017 showed only grade 1 diastolic dysfunction.  #) Disease headache: Patient developed headache after being on nitroglycerin.  She was given aggressive treatment with Fioricet, promethazine.  She was discharged with a short course of metoclopramide and Fioricet.  Head CT on 10/24/2017 was normal.  NSAIDs were avoided due to her AK I/CKD.  #) Acute on chronic stage III kidney disease: Patient was noted to have mild AK I on CKD.  She had received initially IV furosemide which was discontinued.  Renal ultrasound performed on 10/22/2017 was unremarkable.  #) chronic anemia: Patient was noted to have mild worsening of her chronic anemia.  She received 1 unit packed red blood cells.  She was noted to be mildly iron deficient.  She was given oral iron supplementation on  discharge.  She apparently had a normal colonoscopy approximately 6 months ago except for some polyps.  Is likely thought that her anemia was due to stage III CKD.  #) Bipolar disorder: Patient was continued on divalproex  #) Tardive dyskinesia: This developed as a side effect of a prior antipsychotic.  She was continued on velbenzaine.  She was given only a short course of metoclopramide on discharge for headache.  #) Pain/psych: Patient was continued on gabapentin and mirtazapine.  Discharge Diagnoses:  Active Problems:   Hepatitis C virus infection cured after antiviral drug therapy   Chronic kidney disease, stage III (moderate) (HCC)   History of breast cancer   Morbid obesity (HCC)   Neuroleptic-induced tardive dyskinesia   Chronic low back pain   Hypertensive emergency   Acute diastolic CHF (congestive heart failure) ([place])   Microcytic anemia    12/30/2017 1st [facility] office visit/ Adynn Caseres   Chief Complaint  Patient presents with  . Consult    Pt here for SOB with exertion, sometimes SOB at rest, never used inhalers, dry cough,   sob across the room no better since day of d/c rarely sob at rest and able to sleep on side  Flat s  Cough or sob  Assoc with minimal dry cough    NO  obvious day to day or daytime variability or assoc excess/ purulent sputum or mucus plugs or hemoptysis or cp or chest tightness, subjective wheeze or overt sinus or hb symptoms. No unusual exposure hx or h/o childhood pna/ asthma or knowledge  of premature birth.  Sleeping ok flat without nocturnal  or early am exacerbation  of respiratory  c/o's or need for noct saba. Also denies any obvious fluctuation of symptoms with weather or environmental changes or other aggravating or alleviating factors except as outlined above   Current Allergies, Complete Past Medical History, Past Surgical History, Family History, and Social History were reviewed in [employer]  record.  ROS  The following are not active complaints unless bolded Hoarseness, sore throat, dysphagia, dental problems, itching, sneezing,  nasal congestion or discharge of excess mucus or purulent secretions, ear ache,   fever, chills, sweats, unintended wt loss or wt gain, classically pleuritic or exertional cp,  orthopnea pnd or leg swelling, presyncope, palpitations, abdominal pain, anorexia, nausea, vomiting, diarrhea  or change in bowel habits or change in bladder habits, change in stools or change in urine, dysuria, hematuria,  rash, arthralgias, visual complaints, headache, numbness, weakness or ataxia or problems with walking or coordination,  change in mood/affect or memory.        Current Meds  Medication Sig  . Alcohol Swabs (ALCOHOL PREP) PADS   . amLODipine (NORVASC) 10 MG tablet TAKE 1 TABLET BY MOUTH EVERY DAY  . ferrous sulfate 325 (65 FE) MG tablet Take 1 tablet (325 mg total) by mouth 2 (two) times daily with a meal.  . gabapentin (NEURONTIN) 800 MG tablet Take 1 tablet (800 mg total) by mouth 3 (three) times daily.  . hydrALAZINE (APRESOLINE) 25 MG tablet Take 1 tablet (25 mg total) by mouth every 8 (eight) hours.  . Incontinence Supply Disposable (BLADDER CONTROL PADS EX ABSORB) MISC   . INGREZZA 80 MG CAPS Take 80 mg by mouth daily.  . isosorbide mononitrate (IMDUR) 60 MG 24 hr tablet Take 1 tablet (60 mg total) by mouth daily.  Marland Kitchen labetalol (NORMODYNE) 300 MG tablet TAKE 1 TABLET BY MOUTH TWICE DAILY  . Multiple Vitamins-Minerals (ADULT GUMMY) CHEW Chew 1 tablet by mouth daily.  . pantoprazole (PROTONIX) 20 MG tablet Take 1 tablet (20 mg total) by mouth 2 (two) times daily.  . solifenacin (VESICARE) 10 MG tablet Take 1 tablet (10 mg total) by mouth daily.  .     .    .           Review of Systems  Constitutional: Positive for unexpected weight change. Negative for fever.  HENT: Negative for congestion, dental problem, ear pain, nosebleeds, postnasal drip,  rhinorrhea, sinus pressure, sneezing, sore throat and trouble swallowing.   Eyes: Negative for redness and itching.  Respiratory: Positive for shortness of breath. Negative for cough, chest tightness and wheezing.   Cardiovascular: Negative for palpitations and leg swelling.  Gastrointestinal: Negative for nausea and vomiting.  Genitourinary: Negative for dysuria.  Musculoskeletal: Negative for joint swelling.  Skin: Negative for rash.  Neurological: Negative for headaches.  Hematological: Does not bruise/bleed easily.  Psychiatric/Behavioral: Negative for dysphoric mood.       Objective:   Physical Exam   Obese w/c bound bf nad  Wt Readings from Last 3 Encounters:  12/30/17 259 lb (117.5 kg)  12/24/17 264 lb 11.2 oz (120.1 kg)  12/01/17 257 lb 3.2 oz (116.7 kg)     Vital signs reviewed - Note on arrival 02 sats  98% on RA     HEENT: nl dentition, turbinates bilaterally, and oropharynx. Nl external ear canals without cough reflex   NECK :  without JVD/Nodes/TM/ nl carotid upstrokes bilaterally   LUNGS: no  acc muscle use,  Nl contour chest which is clear to A and P bilaterally without cough on insp or exp maneuvers   CV:  RRR  no s3 or murmur or increase in P2, and no edema   ABD:  Obese/ soft and nontender with poor inspiratory excursion in the supine position. No bruits or organomegaly appreciated, bowel sounds nl  MS:  Nl gait/ ext warm without deformities, calf tenderness, cyanosis or clubbing No obvious joint restrictions   SKIN: warm and dry without lesions    NEURO:  alert, approp, nl sensorium with  no motor or cerebellar deficits apparent.      CXR PA and Lateral:   12/30/2017 :   Did not go to cxr as req         Labs  reviewed:      Chemistry      Component Value Date/Time   NA 140 12/01/2017 1347   K 5.2 12/01/2017 1347   CL 105 12/01/2017 1347   CO2 19 (L) 12/01/2017 1347   BUN 38 (H) 12/01/2017 1347   CREATININE 1.86 (H) 12/01/2017 1347       Component Value Date/Time   CALCIUM 10.0 12/01/2017 1347   ALKPHOS 114 12/01/2017 1347   AST 16 12/01/2017 1347   ALT 13 12/01/2017 1347   BILITOT <0.2 12/01/2017 1347        Lab Results  Component Value Date   WBC 6.8 12/01/2017   HGB 10.3 (L) 12/01/2017   HCT 34.4 12/01/2017   MCV 79 12/01/2017   PLT 310 12/01/2017       EOS                                                               0.2                                    12/01/17       Lab Results  Component Value Date   TSH 0.882 10/21/2017      BNP                                                                   355                                   10/21/17      Assessment & Plan:

## 2017-12-30 NOTE — Patient Instructions (Addendum)
Your kidney function appearus to be causing you to be more short of breath than you would be otherwise from your weight and smoking   Please remember to go to the  x-ray department downstairs in the basement  for your tests - we will call you with the results when they are available.      The key is to stop smoking completely before smoking completely stops you  - it's clearly not too late!!!  Pulmonary follow up is as needed

## 2017-12-31 ENCOUNTER — Encounter: Payer: Self-pay | Admitting: Internal Medicine

## 2017-12-31 DIAGNOSIS — F17210 Nicotine dependence, cigarettes, uncomplicated: Secondary | ICD-10-CM

## 2017-12-31 HISTORY — DX: Nicotine dependence, cigarettes, uncomplicated: F17.210

## 2017-12-31 NOTE — Assessment & Plan Note (Signed)
12/30/2017   Walked RA x one lap @ 185 stopped due to  Sob/ lightheaded weak with sats 96% at end - Spirometry 12/30/2017  FEV1 1.27 (68%)  Ratio 99 s curvature / abn effort dep portion only     Symptoms are markedly disproportionate to objective findings and not clear to what extent this is actually a pulmonary  problem but pt does appear to have difficult to sort out respiratory symptoms of unknown origin for which  DDX  = almost all start with A and  include Adherence, Ace Inhibitors, Acid Reflux, Active Sinus Disease, Alpha 1 Antitripsin deficiency, Anxiety masquerading as Airways dz,  ABPA,  Allergy(esp in young), Aspiration (esp in elderly), Adverse effects of meds,  Active smokers, A bunch of PE's/clot burden (a few small clots can't cause this syndrome unless there is already severe underlying pulm or vascular dz with poor reserve),  Anemia or thyroid disorder, plus two Bs  = Bronchiectasis and Beta blocker use..and one C= CHF     Adherence is always the initial "prime suspect" and is a multilayered concern that requires a "trust but verify" approach in every patient - starting with knowing how to use medications, especially inhalers, correctly, keeping up with refills and understanding the fundamental difference between maintenance and prns vs those medications only taken for a very short course and then stopped and not refilled.    Active smoking (see separate a/p)    ? Anxiety/depression/ deconditioning > usually at the bottom of this list of usual suspects but should be much higher on this pt's based on H and P  and may interfere with adherence and also interpretation of response or lack thereof to symptom management which can be quite subjective.   Anemia and low HC03 both related to CRI may be playing a role here > f/u renal planned   ? Beta blocker effects >  Normodyne is not a very specific BB and if wheeze/ cough become more of a problem In the setting of respiratory symptoms of  unknown etiology,  It would be preferable to use bystolic, the most beta -1  selective Beta blocker available in sample form, with bisoprolol the most selective generic choice  on the market, at least on a trial basis, to make sure the spillover Beta 2 effects of the less specific Beta blockers are not contributing to this patient's symptoms.   ? CHF >  Echo 02/20/90 c/w GI diastolic dysfunction only with bnp intermediate > rx is keep bp better controlled   Nothing needed in pulmonary clinic at this time, f/u can be prn   Total time devoted to counseling  > 50 % of initial 60 min office visit:  review case with pt/ discussion of options/alternatives/ personally creating written customized instructions  in presence of pt  then going over those specific  Instructions directly with the pt including how to use all of the meds but in particular covering each new medication in detail and the difference between the maintenance= "automatic" meds and the prns using an action plan format for the latter (If this problem/symptom => do that organization reading Left to right).  Please see AVS from this visit for a full list of these instructions which I personally wrote for this pt and  are unique to this visit.

## 2017-12-31 NOTE — Assessment & Plan Note (Signed)
Body mass index is 47.37 kg/m. Lab Results  Component Value Date   TSH 0.882 10/21/2017     Contributing to gerd risk/ doe/reviewed the need and the process to achieve and maintain neg calorie balance > defer f/u primary care including intermittently monitoring thyroid status

## 2017-12-31 NOTE — Assessment & Plan Note (Signed)
4-5 min discussion re active cigarette smoking in addition to office E&M  Ask about tobacco use:  ongoing Advise quitting    I reviewed the Fletcher curve with the patient that basically indicates  if you quit smoking when your best day FEV1 is still well preserved (as is clearly  the case here)  it is highly unlikely you will progress to severe disease and informed the patient there was  no medication on the market that has proven to alter the curve/ its downward trajectory  or the likelihood of progression of their disease(unlike other chronic medical conditions such as atheroclerosis where we do think we can change the natural hx with risk reducing meds)    Therefore stopping smoking and maintaining abstinence are  the most important aspects of care, not choice of inhalers or for that matter, doctors.   Treatment other than smoking cessation  is entirely directed by severity of symptoms and focused also on reducing exacerbations, not attempting to change the natural history of the disease.   Assess willingness yes  Assist in quit attempt wants chantix but not h/o serious pysch issues > prefer she try nicotine replacement and f/u with pcp Arrange follow up. Follow up per Primary Care planned

## 2018-01-03 ENCOUNTER — Other Ambulatory Visit (HOSPITAL_COMMUNITY): Payer: Medicare HMO

## 2018-01-05 ENCOUNTER — Other Ambulatory Visit (HOSPITAL_COMMUNITY): Payer: Medicare HMO

## 2018-01-06 NOTE — Progress Notes (Signed)
Error Patient left

## 2018-01-18 ENCOUNTER — Telehealth: Payer: Self-pay | Admitting: Family Medicine

## 2018-01-18 ENCOUNTER — Ambulatory Visit: Payer: Medicare HMO | Admitting: Family Medicine

## 2018-01-18 NOTE — Telephone Encounter (Signed)
Copied from [place] [id number]. Topic: Inquiry >> Jan 18, 2018  7:43 AM Pricilla Handler wrote: Reason for CRM: Patient's kidney doctor wants the patient's gabapentin (NEURONTIN) 800 MG tablet to be reduced to 600MG . Please call patient concerning this today.       Thank You!!!

## 2018-01-18 NOTE — Telephone Encounter (Signed)
Copied from [place] [id number]. Topic: Inquiry >> Jan 18, 2018  7:40 AM Pricilla Handler wrote: Reason for CRM: Patient called stating that her solifenacin (VESICARE) 10 MG tablet will not be paid for by her insurance company until Dr. Nolon Rod complete a form that her pharmacy faxed to the office. Patient states that she needs her medication asap. Patient also states that [facility] sent the fax again last night.       Thank You!!!

## 2018-01-19 NOTE — Telephone Encounter (Signed)
Message x 2 sent to Dr. Nolon Rod.

## 2018-01-20 ENCOUNTER — Telehealth: Payer: Self-pay

## 2018-01-20 NOTE — Telephone Encounter (Signed)
Patient calling back about getting gabapentin and paperwork signed before Nolon Rod takes time off.

## 2018-01-20 NOTE — Telephone Encounter (Signed)
Pt calling in regards to a gabapentin 600 mg refill, per pt dr Nolon Rod needed to complete paperwork for insurance advising why new rx for 600 mg needed.  Pt on 800 mg.  Called [facility] on [address] and no paperwork or rx received for 600mg .  Per pt insurance needs dr Nolon Rod signature and reason for change from 800 mg to 600 mg.  Tried calling pt at 2:40 pm to get additional info to help solve this dilemma but no answer. Lm on vm to return office call.  Checked both of dr [employer] and no paperwork found from insurance. Dgaddy, CMA

## 2018-01-20 NOTE — Telephone Encounter (Signed)
Pt is calling back again about the message about her Gabapentin mg changed to 600 mg  and she calling about paperwork for insurance  that need to be signed for [facility]  pt would like to speak with Dr Nolon Rod today

## 2018-01-20 NOTE — Telephone Encounter (Signed)
Patient calling to check on the paperwork that Dr Nolon Rod is to complete. Please advise.

## 2018-01-21 ENCOUNTER — Encounter (HOSPITAL_COMMUNITY): Payer: Self-pay

## 2018-01-21 MED ORDER — GABAPENTIN 600 MG PO TABS: 600.0000 mg | ORAL_TABLET | Freq: Three times a day (TID) | ORAL | 2 refills | Status: DC

## 2018-01-21 NOTE — Telephone Encounter (Signed)
Form is being faxed "again" by Brittney at the pharmacy

## 2018-01-21 NOTE — Telephone Encounter (Signed)
I am not aware of this form. Can someone please leave this on my work area?

## 2018-01-24 NOTE — Telephone Encounter (Signed)
Per pharmacy pt picked up gabapentin 600 mg and pharmacist resent vesicare prior authorization paperwork.  Prior authorization from pharmacy (signed by stallings)and  given to Velna Hatchet to give to Almyra Free to complete prior Chief Strategy Officer. Dgaddy, CMA

## 2018-01-25 ENCOUNTER — Ambulatory Visit (INDEPENDENT_AMBULATORY_CARE_PROVIDER_SITE_OTHER): Payer: Medicare HMO | Admitting: Family Medicine

## 2018-01-25 ENCOUNTER — Other Ambulatory Visit: Payer: Self-pay

## 2018-01-25 ENCOUNTER — Encounter: Payer: Self-pay | Admitting: Family Medicine

## 2018-01-25 VITALS — BP 130/72 | HR 79 | Temp 98.4°F | Resp 18 | Ht 62.0 in | Wt 266.6 lb

## 2018-01-25 DIAGNOSIS — N3281 Overactive bladder: Secondary | ICD-10-CM

## 2018-01-25 DIAGNOSIS — Z029 Encounter for administrative examinations, unspecified: Secondary | ICD-10-CM

## 2018-01-25 DIAGNOSIS — I10 Essential (primary) hypertension: Secondary | ICD-10-CM

## 2018-01-25 DIAGNOSIS — G4709 Other insomnia: Secondary | ICD-10-CM

## 2018-01-25 DIAGNOSIS — R251 Tremor, unspecified: Secondary | ICD-10-CM

## 2018-01-25 DIAGNOSIS — I5031 Acute diastolic (congestive) heart failure: Secondary | ICD-10-CM

## 2018-01-25 MED ORDER — OXYBUTYNIN CHLORIDE ER 5 MG PO TB24: 5.0000 mg | ORAL_TABLET | Freq: Every day | ORAL | 0 refills | Status: DC

## 2018-01-25 NOTE — Progress Notes (Signed)
Chief Complaint  Patient presents with  . paperwork    needs gym form filled out and has some questions     HPI   Tremor She reports tht when she takes a few steps she feels like she is weak and jumpy She states that she has to sit down or she will fall She states that she typically has to grab on to people because the leg and the arm started to shake and gets some weakness lasting 2-3 minutes  Obesity She is here for a clearance form for the gym  She reports that she would like to exercise with a trainer  She states that she has been walking occasionally for exercise on her own She states that she goes to the Gym currently M, W, Fri  Insomnia She reports that she is having a hard time sleeping at night She states that she sometimes is in bed from 9-11am and by 3am she is still awake She takes gabapentin at night which does not help her sleep She had a sleep study 4-5 years ago  She does not nap in the daytime She only gets sleep from 3-6am She reports trazodone which caused cramps seroquel made her gain weight  remeron made her gain weight     Past Medical History:  Diagnosis Date  . Anxiety   . Bipolar and related disorder ([place])   . Breast cancer ([place])   . CHF (congestive heart failure) ([place])   . Chronic back pain   . Chronic kidney disease   . Hepatitis C   . Hypertension   . MRSA infection    of breast incision  . Opioid dependence ([place])    in [facility]    Current Outpatient Medications  Medication Sig Dispense Refill  . Alcohol Swabs (ALCOHOL PREP) PADS     . amLODipine (NORVASC) 10 MG tablet TAKE 1 TABLET BY MOUTH EVERY DAY 30 tablet 3  . Blood Pressure Monitoring (BLOOD PRESSURE MONITOR/WRIST) DEVI     . ferrous sulfate 325 (65 FE) MG tablet Take 1 tablet (325 mg total) by mouth 2 (two) times daily with a meal. 60 tablet 3  . gabapentin (NEURONTIN) 600 MG tablet Take 1 tablet (600 mg total) by mouth 3 (three) times daily. 180 tablet 2  .  hydrALAZINE (APRESOLINE) 25 MG tablet Take 1 tablet (25 mg total) by mouth every 8 (eight) hours. 90 tablet 1  . Incontinence Supply Disposable (BLADDER CONTROL PADS EX ABSORB) MISC     . INGREZZA 80 MG CAPS Take 80 mg by mouth daily.    . isosorbide mononitrate (IMDUR) 60 MG 24 hr tablet Take 1 tablet (60 mg total) by mouth daily. 30 tablet 1  . labetalol (NORMODYNE) 300 MG tablet TAKE 1 TABLET BY MOUTH TWICE DAILY 60 tablet 0  . Multiple Vitamins-Minerals (ADULT GUMMY) CHEW Chew 1 tablet by mouth daily.    . pantoprazole (PROTONIX) 20 MG tablet Take 1 tablet (20 mg total) by mouth 2 (two) times daily. 180 tablet 3  . solifenacin (VESICARE) 10 MG tablet Take 1 tablet (10 mg total) by mouth daily. 90 tablet 3  . oxybutynin (DITROPAN-XL) 5 MG 24 hr tablet Take 1 tablet (5 mg total) by mouth at bedtime. 90 tablet 0   No current facility-administered medications for this visit.     Allergies:  Allergies  Allergen Reactions  . Abilify [Aripiprazole]     Tardive dyskinesia Oral  . Remeron [Mirtazapine]     Wgt  stimulation /gain Dizziness Patient says "can tolerate"  . Trazodone And Nefazodone     Nightmares/sleep diturbance  . Flexeril [Cyclobenzaprine]     Pt states Flexeril makes her feel depressed   . Amoxicillin Rash    Has patient had a PCN reaction causing immediate rash, facial/tongue/throat swelling, SOB or lightheadedness with hypotension: Yes Has patient had a PCN reaction causing severe rash involving mucus membranes or skin necrosis: No Has patient had a PCN reaction that required hospitalization: Yes Has patient had a PCN reaction occurring within the last 10 years: yes If all of the above answers are "NO", then may proceed with Cephalosporin use.     Past Surgical History:  Procedure Laterality Date  . BACK SURGERY  1990  . BREAST SURGERY     "breast cancer survivor" - states partial mastectomy and nodes  . LAPAROSCOPIC CHOLECYSTECTOMY  2002  . MYOMECTOMY     x 2  prior to hysterectomy  . TOTAL ABDOMINAL HYSTERECTOMY     fibroids     Social History   Socioeconomic History  . Marital status: Single    Spouse name: Not on file  . Number of children: Not on file  . Years of education: Not on file  . Highest education level: Not on file  Occupational History  . Not on file  Social Needs  . Financial resource strain: Not on file  . Food insecurity:    Worry: Not on file    Inability: Not on file  . Transportation needs:    Medical: Not on file    Non-medical: Not on file  Tobacco Use  . Smoking status: Current Every Day Smoker    Packs/day: 1.00    Types: Cigarettes  . Smokeless tobacco: Never Used  Substance and Sexual Activity  . Alcohol use: No  . Drug use: Not Currently    Comment: h/o IVD use  . Sexual activity: Not on file  Lifestyle  . Physical activity:    Days per week: Not on file    Minutes per session: Not on file  . Stress: Not on file  Relationships  . Social connections:    Talks on phone: Not on file    Gets together: Not on file    Attends religious service: Not on file    Active member of club or organization: Not on file    Attends meetings of clubs or organizations: Not on file    Relationship status: Not on file  Other Topics Concern  . Not on file  Social History Narrative  . Not on file    Family History  Problem Relation Age of Onset  . Heart attack Mother   . Breast cancer Mother 28  . Cancer Father 24       died of bleeding from kidneys     ROS Review of Systems See HPI Constitution: No fevers or chills No malaise No diaphoresis Skin: No rash or itching Eyes: no blurry vision, no double vision GU: no dysuria or hematuria Neuro: no dizziness or headaches all others reviewed and negative   Objective: Vitals:   01/25/18 1209  BP: 130/72  Pulse: 79  Resp: 18  Temp: 98.4 F (36.9 C)  TempSrc: Oral  SpO2: 96%  Weight: 266 lb 9.6 oz (120.9 kg)  Height: 5\' 2"  (1.575 m)   BP  Readings from Last 3 Encounters:  01/25/18 130/72  12/30/17 126/76  12/24/17 (!) 160/83     Physical Exam  Constitutional:  She is oriented to person, place, and time. She appears well-developed and well-nourished.  Obese female  HENT:  Head: Normocephalic and atraumatic.  Eyes: Conjunctivae and EOM are normal.  Neck: Normal range of motion. No thyromegaly present.  Cardiovascular: Normal rate, regular rhythm and normal heart sounds.  No murmur heard. Pulmonary/Chest: Effort normal and breath sounds normal. No stridor. No respiratory distress. She has no wheezes. She has no rales. She exhibits no tenderness.  Neurological: She is alert and oriented to person, place, and time.  Skin: Skin is warm. Capillary refill takes less than 2 seconds.  Psychiatric: She has a normal mood and affect. Her behavior is normal. Judgment and thought content normal.    Assessment and Plan Keslie was seen today for paperwork.  Diagnoses and all orders for this visit:  Insomnia-  Discussed that due to her heart she cannot be on sedatives  Tremor- no findings on exam and pt not having symptoms   Essential hypertension- bp at goal, continue current meds   Acute diastolic CHF (congestive heart failure) ([place]) - discussed exercise regimen  Overactive bladder- she is having difficulty getting coverage so discontinued vesicare and changed to oxybutynin   Morbid obesity ([place])- pt wants to start an exercise program with a trainer  Completed form for weight loss goals  Administrative encounter  Other orders -     oxybutynin (DITROPAN-XL) 5 MG 24 hr tablet; Take 1 tablet (5 mg total) by mouth at bedtime.  A total of 50 minutes were spent face-to-face with the patient during this encounter and over half of that time was spent on counseling and coordination of care.    [place]

## 2018-01-25 NOTE — Patient Instructions (Addendum)
  Melatonin- try melatonin drops or capsule for sleep    IF you received an x-ray today, you will receive an invoice from [facility]. Please contact [facility] at [phone] with questions or concerns regarding your invoice.   IF you received labwork today, you will receive an invoice from [place]. Please contact [facility] at [phone] with questions or concerns regarding your invoice.   Our billing staff will not be able to assist you with questions regarding bills from these companies.  You will be contacted with the lab results as soon as they are available. The fastest way to get your results is to activate your My Chart account. Instructions are located on the last page of this paperwork. If you have not heard from [place] regarding the results in 2 weeks, please contact this office.

## 2018-01-26 ENCOUNTER — Telehealth: Payer: Self-pay | Admitting: Family Medicine

## 2018-01-26 ENCOUNTER — Ambulatory Visit (HOSPITAL_COMMUNITY): Payer: Medicare HMO | Attending: Gynecologic Oncology

## 2018-01-26 ENCOUNTER — Other Ambulatory Visit (HOSPITAL_COMMUNITY): Payer: Self-pay

## 2018-01-26 NOTE — Telephone Encounter (Signed)
Copied from [place] [id number]. Topic: General - Other >> Jan 26, 2018  7:43 AM Lennox Solders wrote: Reason for CRM: pt was seen yesterday and forgot to ask dr Nolon Rod to give her rx for smoking patches. Pt is trying to stop smoking. Pt has chf. [facility] [address]

## 2018-01-26 NOTE — Addendum Note (Signed)
Addended by: Delia Chimes A on: 01/26/2018 11:01 AM   Modules accepted: Orders

## 2018-01-27 ENCOUNTER — Ambulatory Visit (HOSPITAL_COMMUNITY): Payer: Medicare HMO

## 2018-01-29 ENCOUNTER — Other Ambulatory Visit: Payer: Self-pay | Admitting: Family Medicine

## 2018-01-31 ENCOUNTER — Ambulatory Visit: Payer: Self-pay

## 2018-01-31 MED ORDER — OXYBUTYNIN CHLORIDE ER 5 MG PO TB24: 5.0000 mg | ORAL_TABLET | Freq: Every day | ORAL | 0 refills | Status: DC

## 2018-01-31 NOTE — Telephone Encounter (Signed)
Refill request for labetalol 300 mg #60 with 0 refills. Oxybutynin 5 mg #90 with 0 refills approved. Dgaddy, CMA

## 2018-01-31 NOTE — Telephone Encounter (Signed)
This issue has been resolved. Dgaddy, CMA

## 2018-01-31 NOTE — Telephone Encounter (Signed)
Phone call from pt.  Reported intermittent episodes of numbness in left arm from fingers to elbow, and left leg.  Stated the episodes were occasionally, but have recently  increased in frequency recently. Reported the numbness only lasts a few minutes at a time.  Also, stated that her arm and leg shakes "uncontrollably at times."  She stated she sits and waits until it passes.  Denied any symptoms at this time.  Stated she will "start to feel a pain in the left calf, and then it begins to feel numb, and this moves up the leg, and into the left arm, up to the elbow."  Reported "the pain in calf feels like a big muscle spasm."  Denied any swelling of lower extremities.  Denied any change in color or temperature of the left leg.  Denied any dizziness, difficulty with speech, or vision change. Reported intermittent balance issues and unsteadiness with the episodes of numbness.  Stated she does have intermittent headache; denied HA at this time.  Recommended to sched. Appt. This week within next 3 days.  The pt. Stated she has to have 3 days notice for transportation.  Due to her transportation limitation and Dr. Nolon Rod schedule, could not sched. appt. until next week.  The pt. requested appt. on 02/08/18;  Appt. given w. PCP @ 11:20 AM.  Care advice given per protocol.  Pt. Encour. To call back if symptoms worsen.            Reason for Disposition . [1] Numbness or tingling in one or both feet AND [2] is a chronic symptom (recurrent or ongoing AND present > 4 weeks)  Answer Assessment - Initial Assessment Questions 1. SYMPTOM: "What is the main symptom you are concerned about?" (e.g., weakness, numbness)     Left arm from fingers to elbow and left leg ; feels like the arm/ leg shakes / jumps uncontrollably 2. ONSET: "When did this start?" (minutes, hours, days; while sleeping)     Over past 2-3 months  3. LAST NORMAL: "When was the last time you were normal (no symptoms)?"    About 3 months ago  4. PATTERN  "Does this come and go, or has it been constant since it started?"  "Is it present now?"     Episodes that come and go over a few minutes 5. CARDIAC SYMPTOMS: "Have you had any of the following symptoms: chest pain, difficulty breathing, palpitations?"     Denied heart palpitations or chest pain  6. NEUROLOGIC SYMPTOMS: "Have you had any of the following symptoms: headache, dizziness, vision loss, double vision, changes in speech, unsteady on your feet?"     Denied dizziness or change in speech, denied vision change.  Reported some headaches with the numbness of left side, unsteadiness. 7. OTHER SYMPTOMS: "Do you have any other symptoms?"     Pain in left calf intermittently, preceding episode of numbness; denied swelling of extremities  8. PREGNANCY: "Is there any chance you are pregnant?" "When was your last menstrual period?"     N/a  Protocols used: NEUROLOGIC DEFICIT-A-AH

## 2018-02-01 ENCOUNTER — Telehealth: Payer: Self-pay | Admitting: *Deleted

## 2018-02-01 NOTE — Telephone Encounter (Signed)
Called patient regarding her missed [place] scan, patient wants to reschedule. Patient also canceled her appt for tomorrow due to transportation. Patient stated that she needs three days to set up transportation. Told the patient we would reschedule the appts and call her back.

## 2018-02-02 ENCOUNTER — Inpatient Hospital Stay: Payer: Medicare HMO | Admitting: Obstetrics

## 2018-02-03 ENCOUNTER — Telehealth: Payer: Self-pay | Admitting: *Deleted

## 2018-02-03 ENCOUNTER — Ambulatory Visit (HOSPITAL_COMMUNITY)
Admission: RE | Admit: 2018-02-03 | Discharge: 2018-02-03 | Disposition: A | Discharge status: Home / Self Care | Payer: Medicare HMO | Source: Ambulatory Visit | Attending: Nephrology | Admitting: Nephrology

## 2018-02-03 ENCOUNTER — Encounter (HOSPITAL_COMMUNITY): Payer: Self-pay

## 2018-02-03 DIAGNOSIS — N189 Chronic kidney disease, unspecified: Secondary | ICD-10-CM | POA: Diagnosis not present

## 2018-02-03 DIAGNOSIS — D631 Anemia in chronic kidney disease: Secondary | ICD-10-CM | POA: Diagnosis not present

## 2018-02-03 MED ORDER — SODIUM CHLORIDE 0.9 % IV SOLN
510.0000 mg | INTRAVENOUS | Status: DC
  Administered 2018-02-03: 510 mg via INTRAVENOUS
  Filled 2018-02-03: qty 17

## 2018-02-03 NOTE — Discharge Instructions (Signed)

## 2018-02-03 NOTE — Telephone Encounter (Signed)
Per patient request I have rescheduled her [place] scan/MD visit. Appt scheduled for 8/9. Called and spoke with the patient regarding appts. Patient stated that she was in the middle of an iron treatment. I explained to the patient that the appts will print on her discharge summary from her iron appt. Patient ask that I still call her tomorrow with the appts

## 2018-02-04 ENCOUNTER — Telehealth: Payer: Self-pay | Admitting: *Deleted

## 2018-02-04 NOTE — Telephone Encounter (Signed)
Called and left the patient a message with the appts for 8/9. Ask that she call the office back.

## 2018-02-08 ENCOUNTER — Telehealth: Payer: Self-pay | Admitting: *Deleted

## 2018-02-08 ENCOUNTER — Ambulatory Visit (INDEPENDENT_AMBULATORY_CARE_PROVIDER_SITE_OTHER): Payer: Medicare HMO | Admitting: Family Medicine

## 2018-02-08 ENCOUNTER — Other Ambulatory Visit: Payer: Self-pay

## 2018-02-08 ENCOUNTER — Encounter: Payer: Self-pay | Admitting: Family Medicine

## 2018-02-08 VITALS — BP 147/78 | HR 63 | Temp 97.5°F | Resp 16 | Ht 62.0 in | Wt 269.4 lb

## 2018-02-08 DIAGNOSIS — R531 Weakness: Secondary | ICD-10-CM | POA: Diagnosis not present

## 2018-02-08 DIAGNOSIS — R200 Anesthesia of skin: Secondary | ICD-10-CM | POA: Diagnosis not present

## 2018-02-08 DIAGNOSIS — Z9181 History of falling: Secondary | ICD-10-CM | POA: Diagnosis not present

## 2018-02-08 MED ORDER — GABAPENTIN 800 MG PO TABS: 800.0000 mg | ORAL_TABLET | Freq: Three times a day (TID) | ORAL | 1 refills | Status: DC

## 2018-02-08 NOTE — Progress Notes (Signed)
Chief Complaint  Patient presents with  . c/o intermittent numbness in left arm and let over the past     Taking melatonin x couple of nights ago to help with sleep    HPI  Pt reports that she has been having intermittent numbness only on her left side She states that she feels like she looses control of her left side  Her left leg and her left arm gets numb and weak and she has fallen before due to the left side just giving out She reports that it starts out like a bad muscle spasm If she sits down she has to stay off the leg for 5-10 minutes She reports some blurry vision when she gets the issues on the left side She denies feeling faint or like she will pass out No nausea or diaphoresis   4 review of systems  Past Medical History:  Diagnosis Date  . Anxiety   . Bipolar and related disorder ([place])   . Breast cancer ([place])   . CHF (congestive heart failure) ([place])   . Chronic back pain   . Chronic kidney disease   . Hepatitis C   . Hypertension   . MRSA infection    of breast incision  . Opioid dependence ([place])    in [facility]    Current Outpatient Medications  Medication Sig Dispense Refill  . hydrALAZINE (APRESOLINE) 25 MG tablet Take 1 tablet (25 mg total) by mouth every 8 (eight) hours. 90 tablet 1  . Incontinence Supply Disposable (BLADDER CONTROL PADS EX ABSORB) MISC     . INGREZZA 80 MG CAPS Take 80 mg by mouth daily.    . isosorbide mononitrate (IMDUR) 60 MG 24 hr tablet Take 1 tablet (60 mg total) by mouth daily. 30 tablet 1  . labetalol (NORMODYNE) 300 MG tablet TAKE 1 TABLET BY MOUTH TWICE DAILY 60 tablet 0  . Multiple Vitamins-Minerals (ADULT GUMMY) CHEW Chew 1 tablet by mouth daily.    Marland Kitchen oxybutynin (DITROPAN-XL) 5 MG 24 hr tablet Take 1 tablet (5 mg total) by mouth at bedtime. 90 tablet 0  . pantoprazole (PROTONIX) 20 MG tablet Take 1 tablet (20 mg total) by mouth 2 (two) times daily. 180 tablet 3  . Alcohol Swabs (ALCOHOL PREP) PADS     . amLODipine  (NORVASC) 10 MG tablet TAKE 1 TABLET BY MOUTH EVERY DAY 30 tablet 3  . Blood Pressure Monitoring (BLOOD PRESSURE MONITOR/WRIST) DEVI     . ferrous sulfate 325 (65 FE) MG tablet Take 1 tablet (325 mg total) by mouth 2 (two) times daily with a meal. 60 tablet 3  . gabapentin (NEURONTIN) 600 MG tablet Take 1 tablet (600 mg total) by mouth 3 (three) times daily. 180 tablet 2  . solifenacin (VESICARE) 10 MG tablet Take 1 tablet (10 mg total) by mouth daily. (Patient not taking: Reported on 02/08/2018) 90 tablet 3   No current facility-administered medications for this visit.     Allergies:  Allergies  Allergen Reactions  . Abilify [Aripiprazole]     Tardive dyskinesia Oral  . Remeron [Mirtazapine]     Wgt stimulation /gain Dizziness Patient says "can tolerate"  . Trazodone And Nefazodone     Nightmares/sleep diturbance  . Flexeril [Cyclobenzaprine]     Pt states Flexeril makes her feel depressed   . Amoxicillin Rash    Has patient had a PCN reaction causing immediate rash, facial/tongue/throat swelling, SOB or lightheadedness with hypotension: Yes Has patient had a PCN  reaction causing severe rash involving mucus membranes or skin necrosis: No Has patient had a PCN reaction that required hospitalization: Yes Has patient had a PCN reaction occurring within the last 10 years: yes If all of the above answers are "NO", then may proceed with Cephalosporin use.     Past Surgical History:  Procedure Laterality Date  . BACK SURGERY  1990  . BREAST SURGERY     "breast cancer survivor" - states partial mastectomy and nodes  . LAPAROSCOPIC CHOLECYSTECTOMY  2002  . MYOMECTOMY     x 2 prior to hysterectomy  . TOTAL ABDOMINAL HYSTERECTOMY     fibroids     Social History   Socioeconomic History  . Marital status: Single    Spouse name: Not on file  . Number of children: Not on file  . Years of education: Not on file  . Highest education level: Not on file  Occupational History  . Not  on file  Social Needs  . Financial resource strain: Not on file  . Food insecurity:    Worry: Not on file    Inability: Not on file  . Transportation needs:    Medical: Not on file    Non-medical: Not on file  Tobacco Use  . Smoking status: Current Every Day Smoker    Packs/day: 1.00    Types: Cigarettes  . Smokeless tobacco: Never Used  Substance and Sexual Activity  . Alcohol use: No  . Drug use: Not Currently    Comment: h/o IVD use  . Sexual activity: Not on file  Lifestyle  . Physical activity:    Days per week: Not on file    Minutes per session: Not on file  . Stress: Not on file  Relationships  . Social connections:    Talks on phone: Not on file    Gets together: Not on file    Attends religious service: Not on file    Active member of club or organization: Not on file    Attends meetings of clubs or organizations: Not on file    Relationship status: Not on file  Other Topics Concern  . Not on file  Social History Narrative  . Not on file    Family History  Problem Relation Age of Onset  . Heart attack Mother   . Breast cancer Mother 48  . Cancer Father 58       died of bleeding from kidneys     ROS Review of Systems See HPI Constitution: No fevers or chills No malaise No diaphoresis Skin: No rash or itching Eyes: no blurry vision, no double vision GU: no dysuria or hematuria Neuro: no dizziness or headaches all others reviewed and negative   Objective: Vitals:   02/08/18 1157  BP: (!) 147/78  Pulse: 63  Resp: 16  Temp: (!) 97.5 F (36.4 C)  TempSrc: Oral  SpO2: 95%  Weight: 269 lb 6.4 oz (122.2 kg)  Height: 5\' 2"  (1.575 m)    Physical Exam  Constitutional: She is oriented to person, place, and time. She appears well-developed and well-nourished.  HENT:  Head: Normocephalic and atraumatic.  Eyes: Conjunctivae and EOM are normal.  Cardiovascular: Normal rate, regular rhythm and normal heart sounds.  No murmur  heard. Pulmonary/Chest: Effort normal and breath sounds normal. No stridor. No respiratory distress. She has no wheezes.  Neurological: She is alert and oriented to person, place, and time. She has normal strength. She displays no tremor. No cranial nerve  deficit. She exhibits normal muscle tone. Coordination and gait normal.  Reflex Scores:      Brachioradialis reflexes are 2+ on the right side and 2+ on the left side.      Patellar reflexes are 2+ on the right side and 2+ on the left side. Psychiatric: She has a normal mood and affect. Her behavior is normal. Judgment and thought content normal.  constant tongue rolling noted on exam    Component     Latest Ref Rng & Units 10/21/2017  Iron     28 - 170 ug/dL 34  TIBC     250 - 450 ug/dL 458 (H)  Saturation Ratios     10.4 - 31.8 % 7 (L)  UIBC     ug/dL 424  Vitamin B12     180 - 914 pg/mL 1,545 (H)  Folate     >5.9 ng/mL 9.6   Component     Latest Ref Rng & Units 12/01/2017  Glucose     65 - 99 mg/dL 80  BUN     8 - 27 mg/dL 38 (H)  Creatinine     0.57 - 1.00 mg/dL 1.86 (H)  GFR, Est Non African American     >59 mL/min/1.73 29 (L)  GFR, Est African American     >59 mL/min/1.73 33 (L)  BUN/Creatinine Ratio     12 - 28 20  Sodium     134 - 144 mmol/L 140  Potassium     3.5 - 5.2 mmol/L 5.2  Chloride     96 - 106 mmol/L 105  CO2     20 - 29 mmol/L 19 (L)  Calcium     8.7 - 10.3 mg/dL 10.0  Total Protein     6.0 - 8.5 g/dL 7.5  Albumin     3.6 - 4.8 g/dL 4.3  Globulin, Total     1.5 - 4.5 g/dL 3.2  Albumin/Globulin Ratio     1.2 - 2.2 1.3  Total Bilirubin     0.0 - 1.2 mg/dL <0.2  Alkaline Phosphatase     39 - 117 IU/L 114  AST     0 - 40 IU/L 16  ALT     0 - 32 IU/L 13  WBC     3.4 - 10.8 x10E3/uL 6.8  RBC     3.77 - 5.28 x10E6/uL 4.34  Hemoglobin     11.1 - 15.9 g/dL 10.3 (L)  HCT     34.0 - 46.6 % 34.4  MCV     79 - 97 fL 79  MCH     26.6 - 33.0 pg 23.7 (L)  MCHC     31.5 - 35.7 g/dL 29.9  (L)  RDW     12.3 - 15.4 % 22.3 (H)  Platelets     150 - 450 x10E3/uL 310   CLINICAL DATA:  Severe headache for 3 days. Personal history of left breast carcinoma.  EXAM: CT HEAD WITHOUT CONTRAST  TECHNIQUE: Contiguous axial images were obtained from the base of the skull through the vertex without intravenous contrast.  COMPARISON:  None.  FINDINGS: Brain: No evidence of acute infarction, hemorrhage, hydrocephalus, extra-axial collection, or mass lesion/mass effect. Mild chronic small vessel disease.  Vascular:  No hyperdense vessel or other acute findings.  Skull: No evidence of fracture or other significant bone abnormality.  Sinuses/Orbits:  No acute findings.  Other: None.  IMPRESSION: No acute intracranial abnormality. Mild chronic small vessel disease.  Electronically Signed   By: Earle Gell M.D.   On: 10/24/2017 09:33    Assessment and Plan Colisha was seen today for c/o intermittent numbness in left arm and let over the past .  Diagnoses and all orders for this visit:  Left-sided weakness -     Ambulatory referral to Neurology  Left sided numbness -     Ambulatory referral to Neurology  At high risk for falls   Reviewed previous CT head, labs and discussed referral to Neurology Pt currently asymptomatic Will refer to Neurology for additional work up Grossly normal neurologic exam  Discussed fall precautions - pt declined cane or benched rolling walker today  A total of 25 minutes were spent face-to-face with the patient during this encounter and over half of that time was spent on counseling and coordination of care.  [place]

## 2018-02-08 NOTE — Telephone Encounter (Signed)
Called and left the patient a message with the appt date/times for 8/9. Gave the instructions and arrive time for the [place] scan, MD to follow. Ask the patient to call back to confirm.

## 2018-02-08 NOTE — Addendum Note (Signed)
Addended by: Delia Chimes A on: 02/08/2018 12:43 PM   Modules accepted: Orders

## 2018-02-08 NOTE — Patient Instructions (Addendum)
IF you received an x-ray today, you will receive an invoice from [facility] Radiology. Please contact [facility] Radiology at [phone] with questions or concerns regarding your invoice.   IF you received labwork today, you will receive an invoice from [place]. Please contact [facility] at [phone] with questions or concerns regarding your invoice.   Our billing staff will not be able to assist you with questions regarding bills from these companies.  You will be contacted with the lab results as soon as they are available. The fastest way to get your results is to activate your My Chart account. Instructions are located on the last page of this paperwork. If you have not heard from [place] regarding the results in 2 weeks, please contact this office.    Fall Prevention in the Home Falls can cause injuries and can affect people from all age groups. There are many simple things that you can do to make your home safe and to help prevent falls. What can I do on the outside of my home?  Regularly repair the edges of walkways and driveways and fix any cracks.  Remove high doorway thresholds.  Trim any shrubbery on the main path into your home.  Use bright outdoor lighting.  Clear walkways of debris and clutter, including tools and rocks.  Regularly check that handrails are securely fastened and in good repair. Both sides of any steps should have handrails.  Install guardrails along the edges of any raised decks or porches.  Have leaves, snow, and ice cleared regularly.  Use sand or salt on walkways during winter months.  In the garage, clean up any spills right away, including grease or oil spills. What can I do in the bathroom?  Use night lights.  Install grab bars by the toilet and in the tub and shower. Do not use towel bars as grab bars.  Use non-skid mats or decals on the floor of the tub or shower.  If you need to sit down while you are in the shower, use a plastic,  non-slip stool.  Keep the floor dry. Immediately clean up any water that spills on the floor.  Remove soap buildup in the tub or shower on a regular basis.  Attach bath mats securely with double-sided non-slip rug tape.  Remove throw rugs and other tripping hazards from the floor. What can I do in the bedroom?  Use night lights.  Make sure that a bedside light is easy to reach.  Do not use oversized bedding that drapes onto the floor.  Have a firm chair that has side arms to use for getting dressed.  Remove throw rugs and other tripping hazards from the floor. What can I do in the kitchen?  Clean up any spills right away.  Avoid walking on wet floors.  Place frequently used items in easy-to-reach places.  If you need to reach for something above you, use a sturdy step stool that has a grab bar.  Keep electrical cables out of the way.  Do not use floor polish or wax that makes floors slippery. If you have to use wax, make sure that it is non-skid floor wax.  Remove throw rugs and other tripping hazards from the floor. What can I do in the stairways?  Do not leave any items on the stairs.  Make sure that there are handrails on both sides of the stairs. Fix handrails that are broken or loose. Make sure that handrails are as long as the  stairways.  Check any carpeting to make sure that it is firmly attached to the stairs. Fix any carpet that is loose or worn.  Avoid having throw rugs at the top or bottom of stairways, or secure the rugs with carpet tape to prevent them from moving.  Make sure that you have a light switch at the top of the stairs and the bottom of the stairs. If you do not have them, have them installed. What are some other fall prevention tips?  Wear closed-toe shoes that fit well and support your feet. Wear shoes that have rubber soles or low heels.  When you use a stepladder, make sure that it is completely opened and that the sides are firmly locked.  Have someone hold the ladder while you are using it. Do not climb a closed stepladder.  Add color or contrast paint or tape to grab bars and handrails in your home. Place contrasting color strips on the first and last steps.  Use mobility aids as needed, such as canes, walkers, scooters, and crutches.  Turn on lights if it is dark. Replace any light bulbs that burn out.  Set up furniture so that there are clear paths. Keep the furniture in the same spot.  Fix any uneven floor surfaces.  Choose a carpet design that does not hide the edge of steps of a stairway.  Be aware of any and all pets.  Review your medicines with your healthcare provider. Some medicines can cause dizziness or changes in blood pressure, which increase your risk of falling. Talk with your health care provider about other ways that you can decrease your risk of falls. This may include working with a physical therapist or trainer to improve your strength, balance, and endurance. This information is not intended to replace advice given to you by your health care provider. Make sure you discuss any questions you have with your health care provider. Document Released: 06/19/2002 Document Revised: 11/26/2015 Document Reviewed: 08/03/2014 Elsevier Interactive Patient Education  [employer].

## 2018-02-09 ENCOUNTER — Telehealth: Payer: Self-pay | Admitting: *Deleted

## 2018-02-09 NOTE — Telephone Encounter (Signed)
Patient called and requested appts be moved to either a Tuesday or Thursday. Explained that Dr.Phelps doesn't have a Tuesday or Thursday until September. Patient requested "Can I see a different doctor?"  Explained to the patient I will ask Dr. Gerarda Fraction and call her back.

## 2018-02-10 ENCOUNTER — Telehealth: Payer: Self-pay | Admitting: *Deleted

## 2018-02-10 ENCOUNTER — Ambulatory Visit (HOSPITAL_COMMUNITY)
Admission: RE | Admit: 2018-02-10 | Discharge: 2018-02-10 | Disposition: A | Discharge status: Home / Self Care | Payer: Medicare HMO | Source: Ambulatory Visit | Attending: Nephrology | Admitting: Nephrology

## 2018-02-10 DIAGNOSIS — D509 Iron deficiency anemia, unspecified: Secondary | ICD-10-CM | POA: Diagnosis not present

## 2018-02-10 MED ORDER — SODIUM CHLORIDE 0.9 % IV SOLN
510.0000 mg | INTRAVENOUS | Status: AC
  Administered 2018-02-10: 510 mg via INTRAVENOUS
  Filled 2018-02-10: qty 17

## 2018-02-10 NOTE — Telephone Encounter (Signed)
Per Dr. Gerarda Fraction I have rescheduled the [place] scan from 8/9 to 8/13. (moved to a Tuesday) Rescheduled the MD visit from 8/9 to 9/3. (moved to a Tuesday) Called, left the patient a message with the new date/times of appts, with phone number to call to reschedule if need be.

## 2018-02-18 ENCOUNTER — Ambulatory Visit: Payer: Self-pay | Admitting: Obstetrics

## 2018-02-18 ENCOUNTER — Ambulatory Visit (HOSPITAL_COMMUNITY): Payer: Medicare HMO

## 2018-02-22 ENCOUNTER — Ambulatory Visit (HOSPITAL_COMMUNITY): Admission: RE | Admit: 2018-02-22 | Payer: Medicare HMO | Source: Ambulatory Visit

## 2018-02-23 ENCOUNTER — Other Ambulatory Visit: Payer: Self-pay | Admitting: Family Medicine

## 2018-02-23 ENCOUNTER — Telehealth: Payer: Self-pay | Admitting: *Deleted

## 2018-02-23 NOTE — Telephone Encounter (Signed)
Called and rescheduled the [place] scan that the patient missed. [place] scan moved to the same day as her MD visit 9/3. Patient given new appts date/time

## 2018-02-23 NOTE — Telephone Encounter (Signed)
Amlodipine 10 mg refill Last Refill:12/08/17 # 30 with 3 refills Last OV: 02/08/18 PCP: Burnett Harry Pharmacy:[facility] [address]

## 2018-02-24 ENCOUNTER — Telehealth: Payer: Self-pay | Admitting: Family Medicine

## 2018-02-24 ENCOUNTER — Ambulatory Visit (HOSPITAL_COMMUNITY): Payer: Self-pay | Admitting: Psychiatry

## 2018-02-24 ENCOUNTER — Ambulatory Visit (INDEPENDENT_AMBULATORY_CARE_PROVIDER_SITE_OTHER): Payer: Medicare HMO | Admitting: Neurology

## 2018-02-24 ENCOUNTER — Encounter

## 2018-02-24 ENCOUNTER — Encounter: Payer: Self-pay | Admitting: Neurology

## 2018-02-24 VITALS — BP 141/77 | HR 66 | Ht 62.0 in | Wt 260.0 lb

## 2018-02-24 DIAGNOSIS — R42 Dizziness and giddiness: Secondary | ICD-10-CM

## 2018-02-24 DIAGNOSIS — M6281 Muscle weakness (generalized): Secondary | ICD-10-CM

## 2018-02-24 DIAGNOSIS — R531 Weakness: Secondary | ICD-10-CM

## 2018-02-24 HISTORY — DX: Dizziness and giddiness: R42

## 2018-02-24 MED ORDER — ALPRAZOLAM 1 MG PO TABS: 1.0000 mg | ORAL_TABLET | Freq: Every evening | ORAL | 0 refills | Status: DC | PRN

## 2018-02-24 NOTE — Telephone Encounter (Signed)
Called pt to reschedule visit on 04/28/18. Left VM

## 2018-02-24 NOTE — Progress Notes (Signed)
PATIENT: Sherry [name] DOB: Feb 25, 1956  Chief Complaint  Patient presents with  . Left-sided Weakness    Reports episodes of left-sided weakness and numbness.  States she feels she has no control over her left arm and leg.   Marland Kitchen PCP    Forrest Moron, MD     HISTORICAL  Sherry [name] is a 62 years old female, seen in request by Dr. Nolon Rod, Gwendolyn Fill for evaluation of left-sided weakness, numbness, dizziness spells, initial evaluation was on February 24, 2018.  She has a long history of bipolar disorder, was treated with Prozac in the past, also on Abilify, and other neuroleptic medications, she has developed tardive dyskinesia more than 10 years ago, is not taking any mood stabilizer any more.  She also has history of obesity, hypertension, she lives alone, brought in by transportation today.  She is poor historian.  I reviewed her hospital discharge summary on October 24, 2017, she was admitted for complains of chest tightness, pain, severe hypertension 213/99,  require aggressive treatment, nitroglycerin drip, and oral antihypertensive medications, she was discharged with multiple anti-hypertensive agents, labetalol 300 mg twice a day, hydrochlorothiazide 50 mg daily, amlodipine 10 mg daily, hydralazine 25 mg every 8 hours.  Even before hospital admission in April 2019, especially since her hospital admission, major adjustment of her hypertensive medication, she began to have spells of dizziness, she describes it most to happen when she got up from sitting down position, she felt woozy headed, going to fall, especially her left leg going to give out underneath her, sometimes was associated left arm weakness, if she can sit down for 10 minutes, her symptoms would gradually recover, she never had total loss of consciousness.  Today sitting down blood pressure 156/76 heart rate of 64, standing up 139/77 heart rate of 71  Personally reviewed CT head in April 2019, no acute abnormality, mild  supratentorium small vessel disease.  Laboratory evaluations on Dec 01, 2017, creatinine was 1.86, GFR was 33, Hemoglobin of 10.3, hematocrit was 24, normal TSH, Y07, folic acid, ferritin level was decreased 8,  Echocardiogram in April 2019: Ejection fraction 65 to 70%, wall motion was normal  REVIEW OF SYSTEMS: Full 14 system review of systems performed and notable only for weight gain, fatigue, snoring, joint pain, headache, numbness, weakness, sleepiness, restless leg, aching muscles, increased thirst.  ALLERGIES: Allergies  Allergen Reactions  . Abilify [Aripiprazole]     Tardive dyskinesia Oral  . Remeron [Mirtazapine]     Wgt stimulation /gain Dizziness Patient says "can tolerate"  . Trazodone And Nefazodone     Nightmares/sleep diturbance  . Flexeril [Cyclobenzaprine]     Pt states Flexeril makes her feel depressed   . Amoxicillin Rash    Has patient had a PCN reaction causing immediate rash, facial/tongue/throat swelling, SOB or lightheadedness with hypotension: Yes Has patient had a PCN reaction causing severe rash involving mucus membranes or skin necrosis: No Has patient had a PCN reaction that required hospitalization: Yes Has patient had a PCN reaction occurring within the last 10 years: yes If all of the above answers are "NO", then may proceed with Cephalosporin use.     HOME MEDICATIONS: Current Outpatient Medications  Medication Sig Dispense Refill  . Alcohol Swabs (ALCOHOL PREP) PADS     . amLODipine (NORVASC) 10 MG tablet TAKE 1 TABLET BY MOUTH EVERY DAY 30 tablet 3  . Blood Pressure Monitoring (BLOOD PRESSURE MONITOR/WRIST) DEVI     . ferrous sulfate 325 (65 FE)  MG tablet Take 1 tablet (325 mg total) by mouth 2 (two) times daily with a meal. 60 tablet 3  . gabapentin (NEURONTIN) 800 MG tablet Take 1 tablet (800 mg total) by mouth 3 (three) times daily. 270 tablet 1  . hydrALAZINE (APRESOLINE) 25 MG tablet Take 1 tablet (25 mg total) by mouth every 8 (eight)  hours. 90 tablet 1  . Incontinence Supply Disposable (BLADDER CONTROL PADS EX ABSORB) MISC     . INGREZZA 80 MG CAPS Take 80 mg by mouth daily.    . isosorbide mononitrate (IMDUR) 60 MG 24 hr tablet Take 1 tablet (60 mg total) by mouth daily. 30 tablet 1  . labetalol (NORMODYNE) 300 MG tablet TAKE 1 TABLET BY MOUTH TWICE DAILY 60 tablet 0  . Multiple Vitamins-Minerals (ADULT GUMMY) CHEW Chew 1 tablet by mouth daily.    Marland Kitchen oxybutynin (DITROPAN-XL) 5 MG 24 hr tablet Take 1 tablet (5 mg total) by mouth at bedtime. 90 tablet 0  . pantoprazole (PROTONIX) 20 MG tablet Take 1 tablet (20 mg total) by mouth 2 (two) times daily. 180 tablet 3  . solifenacin (VESICARE) 10 MG tablet Take 1 tablet (10 mg total) by mouth daily. 90 tablet 3   No current facility-administered medications for this visit.     PAST MEDICAL HISTORY: Past Medical History:  Diagnosis Date  . Anxiety   . Bipolar and related disorder ([place])   . Breast cancer ([place])   . CHF (congestive heart failure) ([place])   . Chronic back pain   . Chronic kidney disease   . Hepatitis C   . Hypertension   . Left-sided weakness   . MRSA infection    of breast incision  . Opioid dependence ([place])    in [facility]    PAST SURGICAL HISTORY: Past Surgical History:  Procedure Laterality Date  . BACK SURGERY  1990  . BREAST SURGERY     "breast cancer survivor" - states partial mastectomy and nodes  . LAPAROSCOPIC CHOLECYSTECTOMY  2002  . MYOMECTOMY     x 2 prior to hysterectomy  . TOTAL ABDOMINAL HYSTERECTOMY     fibroids     FAMILY HISTORY: Family History  Problem Relation Age of Onset  . Heart attack Mother   . Breast cancer Mother 23  . Dementia Mother   . Cancer Father 28       died of bleeding from kidneys    SOCIAL HISTORY: Social History   Socioeconomic History  . Marital status: Single    Spouse name: Not on file  . Number of children: 0  . Years of education: 57  . Highest education level: High school graduate    Occupational History  . Occupation: Disabled  Social Needs  . Financial resource strain: Not on file  . Food insecurity:    Worry: Not on file    Inability: Not on file  . Transportation needs:    Medical: Not on file    Non-medical: Not on file  Tobacco Use  . Smoking status: Current Every Day Smoker    Packs/day: 1.00    Types: Cigarettes  . Smokeless tobacco: Never Used  Substance and Sexual Activity  . Alcohol use: No  . Drug use: Not Currently    Comment: h/o IVD use  . Sexual activity: Not on file  Lifestyle  . Physical activity:    Days per week: Not on file    Minutes per session: Not on file  . Stress:  Not on file  Relationships  . Social connections:    Talks on phone: Not on file    Gets together: Not on file    Attends religious service: Not on file    Active member of club or organization: Not on file    Attends meetings of clubs or organizations: Not on file    Relationship status: Not on file  . Intimate partner violence:    Fear of current or ex partner: Not on file    Emotionally abused: Not on file    Physically abused: Not on file    Forced sexual activity: Not on file  Other Topics Concern  . Not on file  Social History Narrative   Lives at home alone.   Right-handed.   Caffeine use: 4 cups coffee/soda     PHYSICAL EXAM   Vitals:   02/24/18 1251  BP: (!) 141/77  Pulse: 66  Weight: 260 lb (117.9 kg)  Height: 5\' 2"  (1.575 m)    Not recorded      Body mass index is 47.55 kg/m.  PHYSICAL EXAMNIATION:  Gen: NAD, conversant, well nourised, obese, well groomed                     Cardiovascular: Regular rate rhythm, no peripheral edema, warm, nontender. Eyes: Conjunctivae clear without exudates or hemorrhage Neck: Supple, no carotid bruits. Pulmonary: Clear to auscultation bilaterally   NEUROLOGICAL EXAM: Frequent, protrusion, mouth chewing movement,  MENTAL STATUS: Speech:    Speech is normal; fluent and spontaneous with  normal comprehension.  Cognition:     Orientation to time, place and person     Normal recent and remote memory     Normal Attention span and concentration     Normal Language, naming, repeating,spontaneous speech     Fund of knowledge   CRANIAL NERVES: CN II: Visual fields are full to confrontation. Fundoscopic exam is normal with sharp discs and no vascular changes. Pupils are round equal and briskly reactive to light. CN III, IV, VI: extraocular movement are normal. No ptosis. CN V: Facial sensation is intact to pinprick in all 3 divisions bilaterally. Corneal responses are intact.  CN VII: Face is symmetric with normal eye closure and smile. CN VIII: Hearing is normal to rubbing fingers CN IX, X: Palate elevates symmetrically. Phonation is normal. CN XI: Head turning and shoulder shrug are intact CN XII: Tongue is midline with normal movements and no atrophy.  MOTOR: There is no pronator drift of out-stretched arms. Muscle bulk and tone are normal. Muscle strength is normal.  REFLEXES: Reflexes are 2+ and symmetric at the biceps, triceps, knees, and ankles. Plantar responses are flexor.  SENSORY: Intact to light touch, pinprick, positional sensation and vibratory sensation are intact in fingers and toes.  COORDINATION: Rapid alternating movements and fine finger movements are intact. There is no dysmetria on finger-to-nose and heel-knee-shin.    GAIT/STANCE: She needs pushed up to get up from seated position, mildly unsteady, also limited by her big body habitus   DIAGNOSTIC DATA (LABS, IMAGING, TESTING) - I reviewed patient records, labs, notes, testing and imaging myself where available.   ASSESSMENT AND PLAN  Sherry [name] is a 62 y.o. female   Dizziness, transient left-sided weakness  This could due to hypoperfusion of the brain especially in the right hemisphere,  She has multiple vascular risk factors, including obesity, hypertension, deconditioning,  Proceed with  MRI of brain  Ultrasound of carotid artery  Continue  aspirin 81 mg daily  Keep well hydration To deep dyskinesia   Marcial Pacas, M.D. Ph.D.  [facility] [address], [place], [place] [address] Ph: [phone] Fax: [phone]  CC:  Forrest Moron, MD

## 2018-02-25 ENCOUNTER — Other Ambulatory Visit: Payer: Self-pay | Admitting: Family Medicine

## 2018-02-25 NOTE — Telephone Encounter (Signed)
amlodipine refill Last Refill:12/08/17 # 30 tab 3 RF Last OV: 12/01/17 PCP: Nolon Rod Pharmacy:[facility] # [phone]

## 2018-02-28 ENCOUNTER — Telehealth: Payer: Self-pay | Admitting: Neurology

## 2018-02-28 NOTE — Telephone Encounter (Signed)
Sherry [name]: [id number] (exp. 02/28/18 to 03/30/18)/medicaid order sent to [facility]. They will reach out to the pt to schedule.

## 2018-03-03 ENCOUNTER — Ambulatory Visit (HOSPITAL_COMMUNITY): Admission: RE | Admit: 2018-03-03 | Payer: Medicare HMO | Source: Ambulatory Visit

## 2018-03-04 ENCOUNTER — Encounter (HOSPITAL_COMMUNITY): Payer: Self-pay

## 2018-03-07 ENCOUNTER — Other Ambulatory Visit: Payer: Self-pay | Admitting: Family Medicine

## 2018-03-09 ENCOUNTER — Ambulatory Visit
Admission: RE | Admit: 2018-03-09 | Discharge: 2018-03-09 | Disposition: A | Discharge status: Home / Self Care | Payer: Medicare HMO | Source: Ambulatory Visit | Attending: Neurology | Admitting: Neurology

## 2018-03-09 ENCOUNTER — Telehealth: Payer: Self-pay | Admitting: Neurology

## 2018-03-09 DIAGNOSIS — M6281 Muscle weakness (generalized): Secondary | ICD-10-CM

## 2018-03-09 NOTE — Telephone Encounter (Signed)
Pt tried to have MRI today at [facility]. She thinks she took the medication too late and was unable to complete it. She said the tech at [facility] told her to call the clinic and advise she will need to have it done at the hospital. The pt said she's had an MRI in the past and taken medication and did well. Pt is wanting to know if it could be scheduled today as she has transportation today, I advised her I was not sure but would send the message for a quick response. Please call to advise

## 2018-03-11 NOTE — Telephone Encounter (Signed)
If she could not tolerate MRI, may hold off on MRI for now, re-evaluate her in Oct 2019.

## 2018-03-11 NOTE — Telephone Encounter (Signed)
Pt returning Sherry [name]'s call  °

## 2018-03-11 NOTE — Telephone Encounter (Signed)
I spoke to the patient and offered her to go to [facility] to have the open MRI. She stated that she wants to try the sedated MRI at [facility] instead. She said she has tried the open mri as well and it didn't help. Please advise.

## 2018-03-14 NOTE — Progress Notes (Deleted)
Consult Note: Gyn-Onc  Consult was requested by Dr. Newton Pigg  CC:  No chief complaint on file.   HPI: [name]  is a very nice 62 y.o. G1 P0-0-1-0  In January 2019 the patient noted significant right lower quadrant pain accompanied by nausea and vomiting.  She presented to [facility] and was referred on to gastroenterology for further work-up with upper endoscopy and colonoscopy.  She states that the pain has now resolved and she no longer has episodes of nausea or vomiting.  She was referred on to gynecology after CT imaging noted a 6 cm pelvic mass.  Dr. Ulanda Edison performed transvaginal ultrasound 12/08/2017 in his office and noted that the right ovary had 2 cystic avascular well-defined lesions with the overall adnexa measuring 6.5 x 5.9 x 5.5 cm.  The left ovary could not be definitively visualized.  A Ca1 25 was ordered and was normal at 11.3 on 11/29/2017.  She met me for the first time in mid-June 2019. Plan was for repeat ultrasound here a few weeks later however there are multiple rescheduling notes mentioned. In addition the patient has seen neurology for work-up of new left sided weakness. MRI has been ordered but not yet done due to inability to tolerate the scan.  ***  Radiology: . 07/25/2016-CT abdomen and pelvis for what is noted as diarrhea and abdominal pain reveals a 5.7 x 5.6 x 6 cm complex cystic or solid lesion.  Pelvic ultrasound was recommended. . 07/2016 - new from first visit we see US Pelvis Transabdominal Complete Result Date: 07/26/2016 CLINICAL DATA: Abnormal CT. Pelvic mass. EXAM: TRANSABDOMINAL ULTRASOUND OF PELVIS TECHNIQUE: Transabdominal ultrasound examination of the pelvis was performed including evaluation of the uterus, ovaries, adnexal regions, and pelvic cul-de-sac. COMPARISON: CT of the abdomen and pelvis 07/25/2016 FINDINGS: Uterus Surgically absent Right ovary Measurements: 6.3 x 5.9 x 6.1 cm. The majority of the right ovary is stating of  by a large simple cyst measuring 4.9 x 5.5 x 5.4 cm. This corresponds with the lesion on CT. Left ovary Measurements: 1.9 x 1.5 x 1.6 cm, within normal limits. Normal appearance/no adnexal mass. Other findings: No abnormal free fluid. 1. 4.9 x 5.5 x 5.4 cm right adnexal cyst appears simple. This is almost certainly benign, but follow up ultrasound is recommended in 1 year   . 07/30/2017-CT abdomen and pelvis-performed for what is stated as acute abdominal and pelvic pain for 2 days-stable 5.8 x 6.2 cm central pelvic cyst ultrasound follow-up in 1 year was recommended.  Small to moderate supraumbilical ventral hernia containing fat. . 08/04/2017 CT abdomen pelvis performed for what is described as clinical abdominal pain in the epigastric region -reveals an interval increase in size of an adnexal mass in the central pelvis now measuring 7 x 6.6 cm in further characterization with an ultrasound is recommended.  There is a stable left adrenal gland adenoma.  Current Meds:  Outpatient Encounter Medications as of 03/15/2018  Medication Sig  . Alcohol Swabs (ALCOHOL PREP) PADS   . ALPRAZolam (XANAX) 1 MG tablet Take 1 tablet (1 mg total) by mouth at bedtime as needed (for MRI).  Marland Kitchen amLODipine (NORVASC) 10 MG tablet TAKE 1 TABLET BY MOUTH DAILY  . Blood Pressure Monitoring (BLOOD PRESSURE MONITOR/WRIST) DEVI   . ferrous sulfate 325 (65 FE) MG tablet Take 1 tablet (325 mg total) by mouth 2 (two) times daily with a meal.  . gabapentin (NEURONTIN) 800 MG tablet Take 1 tablet (800 mg total)  by mouth 3 (three) times daily.  . hydrALAZINE (APRESOLINE) 25 MG tablet Take 1 tablet (25 mg total) by mouth every 8 (eight) hours.  . Incontinence Supply Disposable (BLADDER CONTROL PADS EX ABSORB) MISC   . INGREZZA 80 MG CAPS Take 80 mg by mouth daily.  . isosorbide mononitrate (IMDUR) 60 MG 24 hr tablet Take 1 tablet (60 mg total) by mouth daily.  Marland Kitchen labetalol (NORMODYNE) 300 MG tablet TAKE 1 TABLET BY MOUTH TWICE DAILY  .  Multiple Vitamins-Minerals (ADULT GUMMY) CHEW Chew 1 tablet by mouth daily.  Marland Kitchen oxybutynin (DITROPAN-XL) 5 MG 24 hr tablet Take 1 tablet (5 mg total) by mouth at bedtime.  . pantoprazole (PROTONIX) 20 MG tablet Take 1 tablet (20 mg total) by mouth 2 (two) times daily.  . solifenacin (VESICARE) 10 MG tablet Take 1 tablet (10 mg total) by mouth daily.   No facility-administered encounter medications on file as of 03/15/2018.     Allergy:  Allergies  Allergen Reactions  . Abilify [Aripiprazole]     Tardive dyskinesia Oral  . Remeron [Mirtazapine]     Wgt stimulation /gain Dizziness Patient says "can tolerate"  . Trazodone And Nefazodone     Nightmares/sleep diturbance  . Flexeril [Cyclobenzaprine]     Pt states Flexeril makes her feel depressed   . Amoxicillin Rash    Has patient had a PCN reaction causing immediate rash, facial/tongue/throat swelling, SOB or lightheadedness with hypotension: Yes Has patient had a PCN reaction causing severe rash involving mucus membranes or skin necrosis: No Has patient had a PCN reaction that required hospitalization: Yes Has patient had a PCN reaction occurring within the last 10 years: yes If all of the above answers are "NO", then may proceed with Cephalosporin use.     Social Hx:  Tobacco use: Over 40 pack years and current smoker Alcohol use: None Illicit Drug use: None currently Illicit IV Drug use: Has used in the past.  Past Surgical Hx:  Past Surgical History:  Procedure Laterality Date  . BACK SURGERY  1990  . BREAST SURGERY     "breast cancer survivor" - states partial mastectomy and nodes  . LAPAROSCOPIC CHOLECYSTECTOMY  2002  . MYOMECTOMY     x 2 prior to hysterectomy  . TOTAL ABDOMINAL HYSTERECTOMY     fibroids     Past Medical Hx:  Past Medical History:  Diagnosis Date  . Anxiety   . Bipolar and related disorder ([place])   . Breast cancer ([place])   . CHF (congestive heart failure) ([place])   . Chronic back pain   . Chronic  kidney disease   . Hepatitis C   . Hypertension   . Left-sided weakness   . MRSA infection    of breast incision  . Opioid dependence ([place])    in [place]    Past Gynecological History:   GYNECOLOGIC HISTORY:  No LMP recorded. Patient has had a hysterectomy. Menarche: 62years old P 0010 LMP in her 79s when she underwent hysterectomy Contraceptive none HRT none  Last Pap not applicable  Family Hx:  Family History  Problem Relation Age of Onset  . Heart attack Mother   . Breast cancer Mother 3  . Dementia Mother   . Cancer Father 35       died of bleeding from kidneys    Review of Systems:  Review of Systems - Oncology  Vitals:  There were no vitals taken for this visit. There is no height or  weight on file to calculate BMI.   Physical Exam: ECOG PERFORMANCE STATUS: 1 - Symptomatic but completely ambulatory ***  General :  OBESE Well developed, 62 y.o., female in no apparent distress HEENT:  Normocephalic/atraumatic, symmetric, EOMI, eyelids normal Neck:   Supple, no masses.  Lymphatics:  No cervical/ submandibular/ supraclavicular/ infraclavicular/ inguinal adenopathy Respiratory:  Respirations unlabored, no use of accessory muscles CV:   Deferred Breast:  Deferred Musculoskeletal: No CVA tenderness, normal muscle strength. Abdomen:  Obese, soft, non-tender and nondistended. No evidence of hernia. No masses. Extremities:  No lymphedema, no erythema, non-tender. Skin:   Normal inspection Neuro/Psych:  No focal motor deficit, no abnormal mental status. Normal gait. Normal affect. Alert and oriented to person, place, and time  Genito Urinary: Vulva: Normal external female genitalia.  Bladder/urethra: Urethral meatus normal in size and location. No lesions or   masses, well supported bladder Speculum exam: Vagina: No lesion, no discharge, no bleeding. Bimanual exam: Cervix/Uterus: Surgically absent  Adnexa: No masses. Limited exam given  habitus Rectovaginal:  Good tone, no masses, no cul de sac nodularity, no parametrial involvement or nodularity.   Assessment/Plan: 1. Ovarian cyst o Appears simple on imaging that I reviewed and has normal Ca1 25 o *** o After the patient departed we were able to see a CT scan from January 2018 and it looks as though this lesion is stable over more than a year and a half now o Plan to repeat ultrasound here at [facility] and return to see me after doing so in a few weeks 2. I recommend against surgical resection at this time.  It appears that she has a benign lesion and is fairly asymptomatic.  She told me the last time she had significant pain was in January 2019. o She has higher surgical morbidity risk where the risks likely outweight the benefits i. cardiovascular history and ? Renal disease along with her obesity (BMI of 48) ii. history of 2 prior myomectomies and abdominal hysterectomy. iii. New left sided weakness being worked up 3. If we did proceed to surgery in the future we need to keep her mind that she would need surgical clearance and that she is in rehab for opioid dependence.   Sherry Caprice, MD  03/14/2018, 3:11 PM  Cc: Newton Pigg, MD (Referring OB/GYN) Delia Chimes, MD (PCP)

## 2018-03-15 ENCOUNTER — Telehealth: Payer: Self-pay | Admitting: *Deleted

## 2018-03-15 ENCOUNTER — Ambulatory Visit (HOSPITAL_COMMUNITY): Admission: RE | Admit: 2018-03-15 | Payer: Medicare HMO | Source: Ambulatory Visit

## 2018-03-15 ENCOUNTER — Inpatient Hospital Stay: Payer: Medicare HMO | Attending: Obstetrics | Admitting: Obstetrics

## 2018-03-15 NOTE — Telephone Encounter (Signed)
Left a voicemail informing the patient that per Dr. Krista Blue to hold off on the MRI for now and that she will re-evaluate her in Oct. And for her to her Oct appt with Dr. Krista Blue.

## 2018-03-21 ENCOUNTER — Other Ambulatory Visit: Payer: Self-pay | Admitting: Family Medicine

## 2018-03-22 NOTE — Telephone Encounter (Signed)
Pt called to notify Dr. Herbert Moors that she needs a refill on her isosorbide mononitrate 60 mg tab. No answer, left voice mail and to call back for any questions.

## 2018-03-23 ENCOUNTER — Ambulatory Visit (HOSPITAL_COMMUNITY): Payer: Medicare HMO | Attending: Neurology

## 2018-03-30 ENCOUNTER — Telehealth: Payer: Self-pay | Admitting: *Deleted

## 2018-03-30 NOTE — Telephone Encounter (Signed)
Rescheduled the patient's [place] scan for September 9/26 at 11:30am. Called and left the patient a message with the date/time/instructions.

## 2018-03-30 NOTE — Telephone Encounter (Signed)
rx request for imdur 60 mg #90 with 0 refills approved Dgaddy, CMA

## 2018-04-07 ENCOUNTER — Ambulatory Visit (HOSPITAL_COMMUNITY)
Admission: RE | Admit: 2018-04-07 | Discharge: 2018-04-07 | Disposition: A | Discharge status: Home / Self Care | Payer: Medicare HMO | Source: Ambulatory Visit | Attending: Gynecologic Oncology | Admitting: Gynecologic Oncology

## 2018-04-07 DIAGNOSIS — N9489 Other specified conditions associated with female genital organs and menstrual cycle: Secondary | ICD-10-CM | POA: Insufficient documentation

## 2018-04-07 DIAGNOSIS — N83201 Unspecified ovarian cyst, right side: Secondary | ICD-10-CM

## 2018-04-07 DIAGNOSIS — Z90710 Acquired absence of both cervix and uterus: Secondary | ICD-10-CM | POA: Diagnosis not present

## 2018-04-11 ENCOUNTER — Telehealth: Payer: Self-pay | Admitting: *Deleted

## 2018-04-11 NOTE — Telephone Encounter (Signed)
Called and left the patient a message to call the office back. Need to schedule the patient an appt to see Dr. Gerarda Fraction

## 2018-04-11 NOTE — Telephone Encounter (Signed)
Patient called back and scheduled an appt for Oct 25th at 3pm

## 2018-04-13 HISTORY — PX: LAPAROSCOPIC INCISIONAL / UMBILICAL / VENTRAL HERNIA REPAIR: SUR789

## 2018-04-14 ENCOUNTER — Telehealth: Payer: Self-pay | Admitting: *Deleted

## 2018-04-14 ENCOUNTER — Ambulatory Visit: Payer: Medicare HMO | Admitting: Family Medicine

## 2018-04-14 NOTE — Telephone Encounter (Signed)
Copied from [place] [phone]. Topic: General - Other >> Apr 14, 2018 12:29 PM Margot Ables wrote: Reason for CRM:  calling to notify Dr. Nolon Rod that pt has been hospitalized. Admission 04/12/18 for epigastric pain.

## 2018-04-14 NOTE — Progress Notes (Deleted)
No chief complaint on file.   HPI  4 review of systems  Past Medical History:  Diagnosis Date  . Anxiety   . Bipolar and related disorder ([place])   . Breast cancer ([place])   . CHF (congestive heart failure) ([place])   . Chronic back pain   . Chronic kidney disease   . Hepatitis C   . Hypertension   . Left-sided weakness   . MRSA infection    of breast incision  . Opioid dependence ([place])    in [facility]    Current Outpatient Medications  Medication Sig Dispense Refill  . Alcohol Swabs (ALCOHOL PREP) PADS     . ALPRAZolam (XANAX) 1 MG tablet Take 1 tablet (1 mg total) by mouth at bedtime as needed (for MRI). 3 tablet 0  . amLODipine (NORVASC) 10 MG tablet TAKE 1 TABLET BY MOUTH DAILY 30 tablet 5  . Blood Pressure Monitoring (BLOOD PRESSURE MONITOR/WRIST) DEVI     . ferrous sulfate 325 (65 FE) MG tablet Take 1 tablet (325 mg total) by mouth 2 (two) times daily with a meal. 60 tablet 3  . gabapentin (NEURONTIN) 800 MG tablet Take 1 tablet (800 mg total) by mouth 3 (three) times daily. 270 tablet 1  . hydrALAZINE (APRESOLINE) 25 MG tablet Take 1 tablet (25 mg total) by mouth every 8 (eight) hours. 90 tablet 1  . Incontinence Supply Disposable (BLADDER CONTROL PADS EX ABSORB) MISC     . INGREZZA 80 MG CAPS Take 80 mg by mouth daily.    . isosorbide mononitrate (IMDUR) 60 MG 24 hr tablet TAKE 1 TABLET BY MOUTH DAILY 90 tablet 0  . labetalol (NORMODYNE) 300 MG tablet TAKE 1 TABLET BY MOUTH TWICE DAILY 60 tablet 1  . Multiple Vitamins-Minerals (ADULT GUMMY) CHEW Chew 1 tablet by mouth daily.    Marland Kitchen oxybutynin (DITROPAN-XL) 5 MG 24 hr tablet Take 1 tablet (5 mg total) by mouth at bedtime. 90 tablet 0  . pantoprazole (PROTONIX) 20 MG tablet Take 1 tablet (20 mg total) by mouth 2 (two) times daily. 180 tablet 3  . solifenacin (VESICARE) 10 MG tablet Take 1 tablet (10 mg total) by mouth daily. 90 tablet 3   No current facility-administered medications for this visit.     Allergies:    Allergies  Allergen Reactions  . Abilify [Aripiprazole]     Tardive dyskinesia Oral  . Remeron [Mirtazapine]     Wgt stimulation /gain Dizziness Patient says "can tolerate"  . Trazodone And Nefazodone     Nightmares/sleep diturbance  . Flexeril [Cyclobenzaprine]     Pt states Flexeril makes her feel depressed   . Amoxicillin Rash    Has patient had a PCN reaction causing immediate rash, facial/tongue/throat swelling, SOB or lightheadedness with hypotension: Yes Has patient had a PCN reaction causing severe rash involving mucus membranes or skin necrosis: No Has patient had a PCN reaction that required hospitalization: Yes Has patient had a PCN reaction occurring within the last 10 years: yes If all of the above answers are "NO", then may proceed with Cephalosporin use.     Past Surgical History:  Procedure Laterality Date  . BACK SURGERY  1990  . BREAST SURGERY     "breast cancer survivor" - states partial mastectomy and nodes  . LAPAROSCOPIC CHOLECYSTECTOMY  2002  . MYOMECTOMY     x 2 prior to hysterectomy  . TOTAL ABDOMINAL HYSTERECTOMY     fibroids     Social History  Socioeconomic History  . Marital status: Single    Spouse name: Not on file  . Number of children: 0  . Years of education: 19  . Highest education level: High school graduate  Occupational History  . Occupation: Disabled  Social Needs  . Financial resource strain: Not on file  . Food insecurity:    Worry: Not on file    Inability: Not on file  . Transportation needs:    Medical: Not on file    Non-medical: Not on file  Tobacco Use  . Smoking status: Current Every Day Smoker    Packs/day: 1.00    Types: Cigarettes  . Smokeless tobacco: Never Used  Substance and Sexual Activity  . Alcohol use: No  . Drug use: Not Currently    Comment: h/o IVD use  . Sexual activity: Not on file  Lifestyle  . Physical activity:    Days per week: Not on file    Minutes per session: Not on file  .  Stress: Not on file  Relationships  . Social connections:    Talks on phone: Not on file    Gets together: Not on file    Attends religious service: Not on file    Active member of club or organization: Not on file    Attends meetings of clubs or organizations: Not on file    Relationship status: Not on file  Other Topics Concern  . Not on file  Social History Narrative   Lives at home alone.   Right-handed.   Caffeine use: 4 cups coffee/soda    Family History  Problem Relation Age of Onset  . Heart attack Mother   . Breast cancer Mother 64  . Dementia Mother   . Cancer Father 80       died of bleeding from kidneys     ROS Review of Systems See HPI Constitution: No fevers or chills No malaise No diaphoresis Skin: No rash or itching Eyes: no blurry vision, no double vision GU: no dysuria or hematuria Neuro: no dizziness or headaches * all others reviewed and negative   Objective: There were no vitals filed for this visit.  Physical Exam  Assessment and Plan There are no diagnoses linked to this encounter.   Yarden Manuelito P [employer]

## 2018-04-15 ENCOUNTER — Ambulatory Visit: Payer: Self-pay | Admitting: Obstetrics

## 2018-04-27 ENCOUNTER — Telehealth: Payer: Self-pay | Admitting: Family Medicine

## 2018-04-27 NOTE — Telephone Encounter (Signed)
Verbal given to ok PT

## 2018-04-27 NOTE — Telephone Encounter (Unsigned)
Copied from [place] [phone]. Topic: Quick Communication - Home Health Verbal Orders >> Apr 27, 2018  1:47 PM Leward Quan A wrote: Caller/Agency: [facility] Number: [phone] Requesting OT/PT/Skilled Nursing/Social Work:  Home health, Rn, Education officer, museum and PT evaluation Frequency: 1 x wk 2x wk for  2 week / HHA. 1 x week 2x week for 2 weeks

## 2018-04-28 ENCOUNTER — Ambulatory Visit: Payer: Medicare HMO | Admitting: Family Medicine

## 2018-04-29 ENCOUNTER — Telehealth: Payer: Self-pay | Admitting: Family Medicine

## 2018-04-29 DIAGNOSIS — R531 Weakness: Secondary | ICD-10-CM

## 2018-04-29 NOTE — Telephone Encounter (Signed)
[name] PT with [name] called to sat that PT evaluation was done and patient will be seen 1x wk for 2 weeks, 2xs wk for 2 weeks, and 1x wk for 1 wk  Any questions call [name] at Ph# [phone]

## 2018-04-29 NOTE — Telephone Encounter (Signed)
Copied from [place] [phone]. Topic: Quick Communication - See Telephone Encounter >> Apr 29, 2018 11:00 AM Bea Graff, NT wrote: CRM for notification. See Telephone encounter for: 04/29/18. Hilda Blades with [name] calling to let the physician know that this pt was not seen this week. Pt wanted to wait and start on Monday, 05/02/18. CB#: [phone]

## 2018-04-29 NOTE — Telephone Encounter (Signed)
PT would like to request a Rx for a shower chair with back and arms.   Fax to [facility] [phone]

## 2018-05-01 NOTE — Telephone Encounter (Signed)
Pt message sent to Dr. Nolon Rod Pt req shower chair w/back & arms - fax script to [facility] [phone]

## 2018-05-02 ENCOUNTER — Telehealth: Payer: Self-pay | Admitting: *Deleted

## 2018-05-02 NOTE — Telephone Encounter (Signed)
Patient called and moved her appt. Patient stated "I just got out of the hospital lees than a week ago. I need to move my appt to somewhere between Novmber 4th and November 8th." Appt moved to November 8th.

## 2018-05-02 NOTE — Telephone Encounter (Signed)
Hilda Blades Education officer, museum with [name] [phone] calling to let the physician know that this pt was not seen today CB#: [phone] but with pt request to be seen tomorrow 05-03-18 for eval resources

## 2018-05-03 ENCOUNTER — Telehealth: Payer: Self-pay

## 2018-05-03 NOTE — Telephone Encounter (Signed)
Verbal order given to Jacqlyn Larsen, RN at [facility] to go out and visit pt for hospital f/u (hernia repair surgery). Dgaddy, CMA

## 2018-05-04 NOTE — Telephone Encounter (Signed)
Printed prescription for her shower chair.

## 2018-05-04 NOTE — Telephone Encounter (Signed)
Order sent via fax to [phone] and confirmation received.\ Dgaddy, CMA

## 2018-05-04 NOTE — Telephone Encounter (Signed)
Thank you :)

## 2018-05-04 NOTE — Addendum Note (Signed)
Addended by: Delia Chimes A on: 05/04/2018 11:33 AM   Modules accepted: Orders

## 2018-05-06 ENCOUNTER — Telehealth: Payer: Self-pay | Admitting: Family Medicine

## 2018-05-06 ENCOUNTER — Ambulatory Visit: Payer: Self-pay | Admitting: Obstetrics

## 2018-05-06 NOTE — Telephone Encounter (Signed)
Please advise 

## 2018-05-06 NOTE — Telephone Encounter (Signed)
Copied from [place] [phone]. Topic: General - Other >> May 06, 2018  2:56 PM Janace Aris A wrote: Reason for CRM: Pt would like to send a request over to [facility] for home services, so someone can assist her in the home.   Debbie clapp [phone]

## 2018-05-10 ENCOUNTER — Telehealth: Payer: Self-pay | Admitting: Family Medicine

## 2018-05-10 NOTE — Telephone Encounter (Signed)
FYI

## 2018-05-10 NOTE — Telephone Encounter (Signed)
Copied from [place] [id number]. Topic: General - Other >> May 10, 2018  1:21 PM Leward Quan A wrote: Reason for CRM: [name] PT called to report patient weight gain since 05/05/18 to today 05/10/18 on 05/05/18 pt. Weighted 245.4 lbs and today 05/10/18 is 252.2. Also noted patient had hernia repair surgery on 04/13/18. Per [name] no swelling of legs or feet but abdomen is extended but no distress. Patient will be seeing surgeon on 05/12/18 and Dr Brigitte Pulse on 05/13/18 any further questions Ph# [phone]

## 2018-05-11 ENCOUNTER — Other Ambulatory Visit: Payer: Self-pay | Admitting: Family Medicine

## 2018-05-11 ENCOUNTER — Ambulatory Visit: Payer: Medicare HMO | Admitting: Emergency Medicine

## 2018-05-11 MED ORDER — GABAPENTIN 800 MG PO TABS: 800.0000 mg | ORAL_TABLET | Freq: Three times a day (TID) | ORAL | 0 refills | Status: DC

## 2018-05-11 NOTE — Telephone Encounter (Signed)
Copied from [place] [id number]. Topic: Quick Communication - Rx Refill/Question >> May 11, 2018 10:52 AM Leward Quan A wrote: Medication: gabapentin (NEURONTIN) 800 MG tablet  Patient states that the 600 mg not working.  Has the patient contacted their pharmacy? No.   Preferred Pharmacy (with phone number or street name): [id number] - [place], [place] - [address] AT [place] [phone] (Phone) [phone] (Fax)    Agent: Please be advised that RX refills may take up to 3 business days. We ask that you follow-up with your pharmacy.

## 2018-05-11 NOTE — Telephone Encounter (Signed)
Heather, nurse with [facility] calling to report that the pt has a weight of 253 today and yesterday her weight was 252.3. Nira Conn reports that the pt does not have swelling in her legs but her abdomen is swollen. Pt reports that she has been having bowel movements everyday but it doesn't feel like she is emptying completely. Nira Conn reports that the pt denies chest pain, does not have a cough or congestion and sats remain good. Pt is SOB at baseline with moderate exertion but nurse reports now the pt is SOB with minimal exertion. Nira Conn reports that the pt is not on any diuretics and has an appt with Dr. Nolon Rod on Friday. Nira Conn, nurse with [name] can be contacted at [phone] voicemail) with further questions.

## 2018-05-12 ENCOUNTER — Ambulatory Visit: Payer: Medicare HMO | Admitting: Neurology

## 2018-05-12 ENCOUNTER — Telehealth: Payer: Self-pay | Admitting: *Deleted

## 2018-05-12 NOTE — Telephone Encounter (Signed)
No showed follow up appointment. 

## 2018-05-13 ENCOUNTER — Encounter: Payer: Self-pay | Admitting: Family Medicine

## 2018-05-13 ENCOUNTER — Other Ambulatory Visit: Payer: Self-pay

## 2018-05-13 ENCOUNTER — Ambulatory Visit (INDEPENDENT_AMBULATORY_CARE_PROVIDER_SITE_OTHER): Payer: Medicare HMO | Admitting: Family Medicine

## 2018-05-13 ENCOUNTER — Encounter: Payer: Self-pay | Admitting: Neurology

## 2018-05-13 VITALS — BP 138/75 | HR 67 | Temp 97.2°F | Resp 18 | Ht 62.0 in | Wt 252.8 lb

## 2018-05-13 DIAGNOSIS — I10 Essential (primary) hypertension: Secondary | ICD-10-CM

## 2018-05-13 DIAGNOSIS — R200 Anesthesia of skin: Secondary | ICD-10-CM

## 2018-05-13 DIAGNOSIS — R140 Abdominal distension (gaseous): Secondary | ICD-10-CM

## 2018-05-13 DIAGNOSIS — I5032 Chronic diastolic (congestive) heart failure: Secondary | ICD-10-CM | POA: Diagnosis not present

## 2018-05-13 DIAGNOSIS — Z9181 History of falling: Secondary | ICD-10-CM

## 2018-05-13 NOTE — Progress Notes (Signed)
Chief Complaint  Patient presents with  . f/u for hernia repair    HPI   S/p Hernia repair  Pt reports that she had hernia repair surgery and is very bloated and constipation She states that she does not know what to do about the gas She is taking miralax and mag citrate to have BM She reports that she has good bowel sounds and is not vomiting or nauseous Her surgery was 34/07/9377 for umbilical and inguinal hernia repair with mesh  Hypertension  She reports that upon discharge she was sent home with clonidine for hypertension which has been making her very sleepy She was also added on hydralazine BP Readings from Last 3 Encounters:  05/13/18 138/75  02/24/18 (!) 141/77  02/10/18 (!) 135/55  she denies chest pains and palpitations She states that she does not understand why she needs all these medications  Fall risk She is also requesting a home health aide to help her with her activities of daily living She has left side numbness and has balance issues She reports that she needs help around the house    4 review of systems  Past Medical History:  Diagnosis Date  . Anxiety   . Bipolar and related disorder ([place])   . Breast cancer ([place])   . CHF (congestive heart failure) ([place])   . Chronic back pain   . Chronic kidney disease   . Hepatitis C   . Hypertension   . Left-sided weakness   . MRSA infection    of breast incision  . Opioid dependence ([place])    in [facility]    Current Outpatient Medications  Medication Sig Dispense Refill  . ALPRAZolam (XANAX) 1 MG tablet Take 1 tablet (1 mg total) by mouth at bedtime as needed (for MRI). 3 tablet 0  . amLODipine (NORVASC) 10 MG tablet TAKE 1 TABLET BY MOUTH DAILY 30 tablet 5  . Blood Pressure Monitoring (BLOOD PRESSURE MONITOR/WRIST) DEVI     . famotidine (PEPCID) 20 MG tablet Take 20 mg by mouth 2 (two) times daily.    . ferrous sulfate 325 (65 FE) MG tablet Take 1 tablet (325 mg total) by mouth 2 (two) times daily  with a meal. 60 tablet 3  . FLUoxetine (PROZAC) 20 MG tablet Take 20 mg by mouth daily.    Marland Kitchen gabapentin (NEURONTIN) 800 MG tablet Take 1 tablet (800 mg total) by mouth 3 (three) times daily. 270 tablet 0  . hydrALAZINE (APRESOLINE) 25 MG tablet Take 1 tablet (25 mg total) by mouth every 8 (eight) hours. 90 tablet 1  . INGREZZA 80 MG CAPS Take 80 mg by mouth daily.    . isosorbide mononitrate (IMDUR) 60 MG 24 hr tablet TAKE 1 TABLET BY MOUTH DAILY 90 tablet 0  . labetalol (NORMODYNE) 300 MG tablet TAKE 1 TABLET BY MOUTH TWICE DAILY 60 tablet 1  . oxybutynin (DITROPAN-XL) 5 MG 24 hr tablet Take 1 tablet (5 mg total) by mouth at bedtime. 90 tablet 0  . solifenacin (VESICARE) 10 MG tablet Take 1 tablet (10 mg total) by mouth daily. 90 tablet 3  . Alcohol Swabs (ALCOHOL PREP) PADS     . Incontinence Supply Disposable (BLADDER CONTROL PADS EX ABSORB) MISC     . Multiple Vitamins-Minerals (ADULT GUMMY) CHEW Chew 1 tablet by mouth daily.    . pantoprazole (PROTONIX) 20 MG tablet Take 1 tablet (20 mg total) by mouth 2 (two) times daily. (Patient not taking: Reported on 05/13/2018) 180  tablet 3   No current facility-administered medications for this visit.     Allergies:  Allergies  Allergen Reactions  . Abilify [Aripiprazole]     Tardive dyskinesia Oral  . Remeron [Mirtazapine]     Wgt stimulation /gain Dizziness Patient says "can tolerate"  . Trazodone And Nefazodone     Nightmares/sleep diturbance  . Flexeril [Cyclobenzaprine]     Pt states Flexeril makes her feel depressed   . Amoxicillin Rash    Has patient had a PCN reaction causing immediate rash, facial/tongue/throat swelling, SOB or lightheadedness with hypotension: Yes Has patient had a PCN reaction causing severe rash involving mucus membranes or skin necrosis: No Has patient had a PCN reaction that required hospitalization: Yes Has patient had a PCN reaction occurring within the last 10 years: yes If all of the above answers are  "NO", then may proceed with Cephalosporin use.     Past Surgical History:  Procedure Laterality Date  . BACK SURGERY  1990  . BREAST SURGERY     "breast cancer survivor" - states partial mastectomy and nodes  . LAPAROSCOPIC CHOLECYSTECTOMY  2002  . MYOMECTOMY     x 2 prior to hysterectomy  . TOTAL ABDOMINAL HYSTERECTOMY     fibroids     Social History   Socioeconomic History  . Marital status: Single    Spouse name: Not on file  . Number of children: 0  . Years of education: 11  . Highest education level: High school graduate  Occupational History  . Occupation: Disabled  Social Needs  . Financial resource strain: Not on file  . Food insecurity:    Worry: Not on file    Inability: Not on file  . Transportation needs:    Medical: Not on file    Non-medical: Not on file  Tobacco Use  . Smoking status: Current Every Day Smoker    Packs/day: 1.00    Types: Cigarettes  . Smokeless tobacco: Never Used  Substance and Sexual Activity  . Alcohol use: No  . Drug use: Not Currently    Comment: h/o IVD use  . Sexual activity: Not on file  Lifestyle  . Physical activity:    Days per week: Not on file    Minutes per session: Not on file  . Stress: Not on file  Relationships  . Social connections:    Talks on phone: Not on file    Gets together: Not on file    Attends religious service: Not on file    Active member of club or organization: Not on file    Attends meetings of clubs or organizations: Not on file    Relationship status: Not on file  Other Topics Concern  . Not on file  Social History Narrative   Lives at home alone.   Right-handed.   Caffeine use: 4 cups coffee/soda    Family History  Problem Relation Age of Onset  . Heart attack Mother   . Breast cancer Mother 23  . Dementia Mother   . Cancer Father 29       died of bleeding from kidneys     ROS Review of Systems See HPI Constitution: No fevers or chills No malaise No diaphoresis Skin:  No rash or itching Eyes: no blurry vision, no double vision GU: no dysuria or hematuria Neuro: no dizziness or headaches all others reviewed and negative   Objective: Vitals:   05/13/18 1419  BP: 138/75  Pulse: 67  Resp: 18  Temp: (!) 97.2 F (36.2 C)  TempSrc: Oral  SpO2: 96%  Weight: 252 lb 12.8 oz (114.7 kg)  Height: 5\' 2"  (1.575 m)     Physical Exam  Constitutional: She is oriented to person, place, and time. She appears well-developed and well-nourished.  HENT:  Head: Normocephalic and atraumatic.  Eyes: Conjunctivae and EOM are normal.  Neck: Normal range of motion. Neck supple.  Cardiovascular: Normal rate, regular rhythm and normal heart sounds.  Pulmonary/Chest: Effort normal and breath sounds normal. No stridor. No respiratory distress. She has no wheezes.  Abdominal: Soft. Bowel sounds are normal. She exhibits distension. She exhibits no mass. There is no tenderness. There is no rebound and no guarding.  Neurological: She is alert and oriented to person, place, and time.  Skin: Skin is warm. Capillary refill takes less than 2 seconds.  Psychiatric: She has a normal mood and affect. Her behavior is normal. Judgment and thought content normal.    Assessment and Plan Sherry [name] was seen today for f/u for hernia repair.  Diagnoses and all orders for this visit:  Chronic diastolic congestive heart failure ([place])- follow up with Cardiology to discuss medications  Concerned that patient might be noncompliant if she does not believe she needs all these medications -     Comprehensive metabolic panel -     Ambulatory referral to Cardiology -     Shower chair  Left sided numbness -     Shower chair -     Ambulatory referral to [facility]  At high risk for falls- will give shower chair order for patient to give to home care Completed referral for home aide through home health -     Shower chair -     Ambulatory referral to [facility]  Malignant hypertension- improved  with increased medication titration Pt on multiple classes of drug to control bp Discussed that clonidine should not be stopped since it causes rebound hypertension  -     Comprehensive metabolic panel  Abdominal distension (gaseous)- advised hydration, ambulation, fiber supplementation with metamucil     Sherry [name]

## 2018-05-13 NOTE — Patient Instructions (Signed)
° ° ° °  If you have lab work done today you will be contacted with your lab results within the next 2 weeks.  If you have not heard from us then please contact us. The fastest way to get your results is to register for My Chart. ° ° °IF you received an x-ray today, you will receive an invoice from [facility]. Please contact [facility] at [phone] with questions or concerns regarding your invoice.  ° °IF you received labwork today, you will receive an invoice from [facility]. Please contact [facility] at [phone] with questions or concerns regarding your invoice.  ° °Our billing staff will not be able to assist you with questions regarding bills from these companies. ° °You will be contacted with the lab results as soon as they are available. The fastest way to get your results is to activate your My Chart account. Instructions are located on the last page of this paperwork. If you have not heard from us regarding the results in 2 weeks, please contact this office. °  ° ° ° °

## 2018-05-14 LAB — COMPREHENSIVE METABOLIC PANEL
ALT: 7 IU/L (ref 0–32)
AST: 10 IU/L (ref 0–40)
Albumin/Globulin Ratio: 1.3 (ref 1.2–2.2)
Albumin: 3.9 g/dL (ref 3.6–4.8)
Alkaline Phosphatase: 121 IU/L — ABNORMAL HIGH (ref 39–117)
BUN/Creatinine Ratio: 14 (ref 12–28)
BUN: 28 mg/dL — ABNORMAL HIGH (ref 8–27)
Bilirubin Total: <0.2 mg/dL (ref 0.0–1.2)
CO2: 19 mmol/L — ABNORMAL LOW (ref 20–29)
Calcium: 9.4 mg/dL (ref 8.7–10.3)
Chloride: 105 mmol/L (ref 96–106)
Creatinine, Ser: 2.02 mg/dL — ABNORMAL HIGH (ref 0.57–1.00)
GFR calc Af Amer: 30 mL/min/{1.73_m2} — ABNORMAL LOW (ref >59)
GFR calc non Af Amer: 26 mL/min/{1.73_m2} — ABNORMAL LOW (ref >59)
Globulin, Total: 3 g/dL (ref 1.5–4.5)
Glucose: 94 mg/dL (ref 65–99)
Potassium: 5.1 mmol/L (ref 3.5–5.2)
Sodium: 140 mmol/L (ref 134–144)
Total Protein: 6.9 g/dL (ref 6.0–8.5)

## 2018-05-17 ENCOUNTER — Other Ambulatory Visit: Payer: Self-pay | Admitting: Family Medicine

## 2018-05-17 ENCOUNTER — Telehealth: Payer: Self-pay | Admitting: Family Medicine

## 2018-05-17 ENCOUNTER — Ambulatory Visit: Payer: Self-pay

## 2018-05-17 NOTE — Telephone Encounter (Signed)
Refill request for a medication that was prescribed while in hospital.   It's not on the medication list.  Has appt with Dr. Nolon Rod 06/24/18.  See attached.

## 2018-05-17 NOTE — Telephone Encounter (Signed)
Pt called stating the she has just submitted request for medication refill on Terazosin. Pt states she is out and need this refilled.  Reason for Disposition . Pharmacy calling with prescription question and triager answers question  Answer Assessment - Initial Assessment Questions 1. SYMPTOMS: "Do you have any symptoms?"     No out of medication 2. SEVERITY: If symptoms are present, ask "Are they mild, moderate or severe?"    none  Protocols used: MEDICATION QUESTION CALL-A-AH

## 2018-05-17 NOTE — Telephone Encounter (Signed)
Pt requesting medication form hospital. She has not been seen for this. appt 06/24/18. thanks

## 2018-05-17 NOTE — Telephone Encounter (Signed)
Copied from [place] [phone]. Topic: Quick Communication - Home Health Verbal Orders >> May 17, 2018  1:22 PM Adelene Idler wrote: Caller/Agency: [facility] Number: [phone] Requesting OT/PT/Skilled Nursing/Social Work: Home Health Aide Frequency: 2 x week for 1 week

## 2018-05-17 NOTE — Telephone Encounter (Signed)
Copied from [place] [phone]. Topic: Quick Communication - Rx Refill/Question >> May 17, 2018  8:54 AM Waylan Rocher, [place] L wrote: Medication: terazosin 2mg  (prescribed during an admission for blood pressure, no pills left)  Has the patient contacted their pharmacy? Yes.   (Agent: If no, request that the patient contact the pharmacy for the refill.) (Agent: If yes, when and what did the pharmacy advise?)  Preferred Pharmacy (with phone number or street name): [id number] - [place], [place] - [address] [place] [address] Phone: [phone] Fax: [phone]  Agent: Please be advised that RX refills may take up to 3 business days. We ask that you follow-up with your pharmacy.

## 2018-05-19 ENCOUNTER — Telehealth: Payer: Self-pay | Admitting: Family Medicine

## 2018-05-19 ENCOUNTER — Telehealth: Payer: Self-pay | Admitting: *Deleted

## 2018-05-19 NOTE — Telephone Encounter (Signed)
Patient called and voiced concern for her appt tomorrow. Patient thought that the appt was for an [place] scan, but I explained it was to go over the scan results. Patient stated that "I just wanted to check because I just got of the hospital after a hernia repair." Patient stated that she will be at the appt tomorrow.

## 2018-05-19 NOTE — Telephone Encounter (Signed)
Copied from [place] [phone]. Topic: General - Other >> May 19, 2018  2:38 PM Keene Breath wrote: Reason for CRM: [name] with [facility] called to inform the doctor that patient missed PT this week, refused a visit today because she said she was not feeling well, cancelled her PT for tomorrow because of a doctor's appointment as well.  [name] stated that the patient has not been participating with the therapy an only has one more scheduled visit next week and will be discharged.

## 2018-05-20 ENCOUNTER — Inpatient Hospital Stay: Payer: Medicare HMO | Attending: Obstetrics | Admitting: Obstetrics

## 2018-05-20 ENCOUNTER — Encounter: Payer: Self-pay | Admitting: Obstetrics

## 2018-05-20 VITALS — BP 164/65 | HR 66 | Temp 97.3°F | Resp 18 | Ht 62.0 in | Wt 253.4 lb

## 2018-05-20 DIAGNOSIS — Z6841 Body Mass Index (BMI) 40.0 and over, adult: Secondary | ICD-10-CM | POA: Diagnosis not present

## 2018-05-20 DIAGNOSIS — N83201 Unspecified ovarian cyst, right side: Secondary | ICD-10-CM

## 2018-05-20 DIAGNOSIS — N184 Chronic kidney disease, stage 4 (severe): Secondary | ICD-10-CM | POA: Insufficient documentation

## 2018-05-20 DIAGNOSIS — Z90710 Acquired absence of both cervix and uterus: Secondary | ICD-10-CM | POA: Diagnosis not present

## 2018-05-20 NOTE — Telephone Encounter (Signed)
Spoke with Sherry [name] Pt  Needs "skill" for nursing to continue.  Their last visit is next week with pt.  Will get [facility] out at most 1x next week as services end on Tuesday. 05/24/2018 Dr. Nolon Rod has filled out all paperwork for [facility] and now they are awaiting [facility] to see pt and evaluate for Medicaid's PCS services.

## 2018-05-20 NOTE — Patient Instructions (Addendum)
Preparing for your Surgery  Plan for surgery on June 16, 2018 or July 21, 2018 with Dr. Precious Haws at [facility]. We will let you know closer to the date which day will be your surgery date. You will be scheduled for a robotic assisted left salpingo-oophorectomy, possible bilateral salpingo-oophorectomy, possible staging and any indicated procedures.   Pre-operative Testing -You will receive a phone call from presurgical testing at [facility] to arrange for a pre-operative testing appointment before your surgery.  This appointment normally occurs one to two weeks before your scheduled surgery.   -Bring your insurance card, copy of an advanced directive if applicable, medication list  -At that visit, you will be asked to sign a consent for a possible blood transfusion in case a transfusion becomes necessary during surgery.  The need for a blood transfusion is rare but having consent is a necessary part of your care.     -You should not be taking blood thinners or aspirin at least ten days prior to surgery unless instructed by your surgeon.  Day Before Surgery at [place] will be asked to take in a light diet the day before surgery.  Avoid carbonated beverages.  You will be advised to have nothing to eat or drink after midnight the evening before.    Eat a light diet the day before surgery.  Examples including soups, broths, toast, yogurt, mashed potatoes.  Things to avoid include carbonated beverages (fizzy beverages), raw fruits and raw vegetables, or beans.   If your bowels are filled with gas, your surgeon will have difficulty visualizing your pelvic organs which increases your surgical risks.  Your role in recovery Your role is to become active as soon as directed by your doctor, while still giving yourself time to heal.  Rest when you feel tired. You will be asked to do the following in order to speed your recovery:  - Cough and breathe deeply.  This helps toclear and expand your lungs and can prevent pneumonia. You may be given a spirometer to practice deep breathing. A staff member will show you how to use the spirometer. - Do mild physical activity. Walking or moving your legs help your circulation and body functions return to normal. A staff member will help you when you try to walk and will provide you with simple exercises. Do not try to get up or walk alone the first time. - Actively manage your pain. Managing your pain lets you move in comfort. We will ask you to rate your pain on a scale of zero to 10. It is your responsibility to tell your doctor or nurse where and how much you hurt so your pain can be treated.  Special Considerations -If you are diabetic, you may be placed on insulin after surgery to have closer control over your blood sugars to promote healing and recovery.  This does not mean that you will be discharged on insulin.  If applicable, your oral antidiabetics will be resumed when you are tolerating a solid diet.  -Your final pathology results from surgery should be available around one week after surgery and the results will be relayed to you when available.  -Dr. Everitt Amber is the Surgeon that assists your GYN Oncologist with surgery.  The next day after your surgery you will either see your GYN Oncologist, Dr. Everitt Amber, or Dr. Lahoma Crocker.  -FMLA forms can be faxed to [phone] and please allow 5-7 business days for completion.  Blood Transfusion Information WHAT IS A BLOOD TRANSFUSION? A transfusion is the replacement of blood or some of its parts. Blood is made up of multiple cells which provide different functions.  Red blood cells carry oxygen and are used for blood loss replacement.  White blood cells fight against infection.  Platelets control bleeding.  Plasma helps clot blood.  Other blood products are available for specialized needs, such as hemophilia or other clotting  disorders. BEFORE THE TRANSFUSION  Who gives blood for transfusions?   You may be able to donate blood to be used at a later date on yourself (autologous donation).  Relatives can be asked to donate blood. This is generally not any safer than if you have received blood from a stranger. The same precautions are taken to ensure safety when a relative's blood is donated.  Healthy volunteers who are fully evaluated to make sure their blood is safe. This is blood bank blood. Transfusion therapy is the safest it has ever been in the practice of medicine. Before blood is taken from a donor, a complete history is taken to make sure that person has no history of diseases nor engages in risky social behavior (examples are intravenous drug use or sexual activity with multiple partners). The donor's travel history is screened to minimize risk of transmitting infections, such as malaria. The donated blood is tested for signs of infectious diseases, such as HIV and hepatitis. The blood is then tested to be sure it is compatible with you in order to minimize the chance of a transfusion reaction. If you or a relative donates blood, this is often done in anticipation of surgery and is not appropriate for emergency situations. It takes many days to process the donated blood. RISKS AND COMPLICATIONS Although transfusion therapy is very safe and saves many lives, the main dangers of transfusion include:   Getting an infectious disease.  Developing a transfusion reaction. This is an allergic reaction to something in the blood you were given. Every precaution is taken to prevent this. The decision to have a blood transfusion has been considered carefully by your caregiver before blood is given. Blood is not given unless the benefits outweigh the risks.

## 2018-05-20 NOTE — Telephone Encounter (Signed)
Please give order to start home health services.

## 2018-05-20 NOTE — Progress Notes (Signed)
Consult Note: Gyn-Onc  Consult was requested by Dr. Newton Pigg  CC:  Chief Complaint  Patient presents with  . Cyst of right ovary    HPI: Sherry [name]  is a very nice 62 y.o. G1 P0-0-1-0  . Interval History Since her last visit 12/2017 she had a followup TVUS.   From 04/12/18 to 10/15 19 she was admitted at [facility] after she presented to their ER with severe epigastric abdominal wall pain and nausea/emesis and was taken urgently to the OR for an incarcerated ventral hernia. She was noted to a hypertensive emergency (BP 226/83 & 214/89) and then bradycardia while admitted and had to be followed by medicine during her 2 week hospital stay. She had hyperkalemia due to "stage IV" CKD.  She went to surgery 04/13/18 where a LSC LOA and placment of a 15x20 mesh was performed.   On my review the ovarian cyst has gone from 6.2x5.7x5.7 07/2016 to 6.7 x 6.5 x 6.4cm 04/2018. She claims to have more frequent pain than when I first met her.   . Presenting History (Oncologic Course if applicable) In January 0174 the patient noted significant right lower quadrant pain accompanied by nausea and vomiting.  She presented to [facility] and was referred on to gastroenterology for further work-up with upper endoscopy and colonoscopy.  She states that the pain has now resolved and she no longer has episodes of nausea or vomiting.  She was referred on to gynecology after CT imaging noted a 6 cm pelvic mass.  Dr. Ulanda Edison with the referring GYN performed transvaginal ultrasound 12/08/2017 in his office and noted that the right ovary had 2 cystic avascular well-defined lesions with the overall adnexa measuring 6.5 x 5.9 x 5.5 cm.  The left ovary could not be definitively visualized.  A Ca1 25 was ordered and was normal at 11.3 on 11/29/2017.  Radiology: . 07/25/2016-CT abdomen and pelvis for what is noted as diarrhea and abdominal pain reveals a 5.7 x 5.6 x 6 cm complex cystic or solid lesion.  Pelvic  ultrasound was recommended. . 07/30/2017-CT abdomen and pelvis-performed for what is stated as acute abdominal and pelvic pain for 2 days-stable 5.8 x 6.2 cm central pelvic cyst ultrasound follow-up in 1 year was recommended.  Small to moderate supraumbilical ventral hernia containing fat. . 08/04/2017 CT abdomen pelvis performed for what is described as clinical abdominal pain in the epigastric region -reveals an interval increase in size of an adnexal mass in the central pelvis now measuring 7 x 6.6 cm in further characterization with an ultrasound is recommended.  There is a stable left adrenal gland adenoma. . 12/08/17 - OSH TVUS 2 cystic lesions with adnexa measures 6.5 x 5.9 x 5.5cm. . 04/07/18 - TVUS - Right ovary Measurements: 6.9 x 6.1 x 6.5 centimeters. Located near the vaginal cuff a round 6.3 centimeter hypoechoic mass with homogeneous internal echoes (image 28) is redemonstrated.Left ovary Measurements: Not identified despite transabdominal and transvaginal imaging. Based on the CTs, there does appear to be left ovarian parenchyma present, although it too is inseparable from dominant pelvic mass which is centrally located. IMPRESSION: 1. Slowly enlarging complex cystic mass within the pelvis which has an ultrasound appearance most compatible with endometrioma. By ultrasound this has increased from about 5.5 (6cm above report though) cm in January 2018 to 6.3 cm now. . 04/12/18 CTAP in PACS - stable 2.2 cm left adrenal adenoma. Probable renal cysts. Left ovary with homogeneous mass 7.1 x 6.7cm  slightly larger than 07/2016. Small umbilical hernia with peritoneal fat. Midline ventral hernia above the umbilicus containing peritoneal fat and a short segment of transverse colon.  Current Meds:  Outpatient Encounter Medications as of 05/20/2018  Medication Sig  . Alcohol Swabs (ALCOHOL PREP) PADS   . amLODipine (NORVASC) 10 MG tablet TAKE 1 TABLET BY MOUTH DAILY  . Blood Pressure Monitoring (BLOOD  PRESSURE MONITOR/WRIST) DEVI   . cloNIDine (CATAPRES) 0.1 MG tablet Take by mouth.  . famotidine (PEPCID) 20 MG tablet Take 20 mg by mouth 2 (two) times daily.  . ferrous sulfate 325 (65 FE) MG tablet Take 1 tablet (325 mg total) by mouth 2 (two) times daily with a meal.  . FLUoxetine (PROZAC) 20 MG tablet Take 20 mg by mouth daily.  Marland Kitchen gabapentin (NEURONTIN) 800 MG tablet Take 1 tablet (800 mg total) by mouth 3 (three) times daily.  . hydrALAZINE (APRESOLINE) 100 MG tablet Take by mouth.  . hydrALAZINE (APRESOLINE) 25 MG tablet Take 1 tablet (25 mg total) by mouth every 8 (eight) hours.  . Incontinence Supply Disposable (BLADDER CONTROL PADS EX ABSORB) MISC   . INGREZZA 80 MG CAPS Take 80 mg by mouth daily.  . isosorbide mononitrate (IMDUR) 60 MG 24 hr tablet TAKE 1 TABLET BY MOUTH DAILY  . labetalol (NORMODYNE) 100 MG tablet Take by mouth.  . labetalol (NORMODYNE) 300 MG tablet TAKE 1 TABLET BY MOUTH TWICE DAILY (Patient taking differently: Take 100 mg by mouth 2 (two) times daily. )  . Multiple Vitamins-Minerals (ADULT GUMMY) CHEW Chew 1 tablet by mouth daily.  Marland Kitchen oxybutynin (DITROPAN-XL) 5 MG 24 hr tablet Take 1 tablet (5 mg total) by mouth at bedtime.  . pantoprazole (PROTONIX) 20 MG tablet Take 1 tablet (20 mg total) by mouth 2 (two) times daily.  . solifenacin (VESICARE) 10 MG tablet Take 1 tablet (10 mg total) by mouth daily.  Marland Kitchen terazosin (HYTRIN) 2 MG capsule Take by mouth.  . ALPRAZolam (XANAX) 1 MG tablet Take 1 tablet (1 mg total) by mouth at bedtime as needed (for MRI). (Patient not taking: Reported on 05/20/2018)   No facility-administered encounter medications on file as of 05/20/2018.     Allergy:  Allergies  Allergen Reactions  . Abilify [Aripiprazole]     Tardive dyskinesia Oral  . Remeron [Mirtazapine]     Wgt stimulation /gain Dizziness Patient says "can tolerate"  . Trazodone And Nefazodone     Nightmares/sleep diturbance  . Flexeril [Cyclobenzaprine]     Pt states  Flexeril makes her feel depressed   . Amoxicillin Diarrhea    NOTE the patient has had PCN WITHOUT reaction  Has patient had a PCN reaction causing immediate rash, facial/tongue/throat swelling, SOB or lightheadedness with hypotension: No Has patient had a PCN reaction causing severe rash involving mucus membranes or skin necrosis: No Has patient had a PCN reaction that required hospitalization: No Has patient had a PCN reaction occurring within the last 10 years: No If all of the above answers are "NO", then may proceed with Cephalosporin use.     Social Hx:  Tobacco use: Over 40 pack years and current smoker Alcohol use: None Illicit Drug use: None currently Illicit IV Drug use: Has used in the past.  Past Surgical Hx:  Past Surgical History:  Procedure Laterality Date  . BACK SURGERY  1990  . BREAST SURGERY     "breast cancer survivor" - states partial mastectomy and nodes  . LAPAROSCOPIC CHOLECYSTECTOMY  2002  .  LAPAROSCOPIC INCISIONAL / UMBILICAL / VENTRAL HERNIA REPAIR  04/13/2018   with BARD 15x 01XB mesh (supraumbilical)  . MYOMECTOMY     x 2 prior to hysterectomy  . TOTAL ABDOMINAL HYSTERECTOMY     fibroids     Past Medical Hx:  Past Medical History:  Diagnosis Date  . Anxiety   . Bipolar and related disorder ([place])   . Breast cancer ([place])   . CHF (congestive heart failure) ([place])   . Chronic back pain   . Chronic kidney disease    "Stage IV" - states due to hypertension  . Hepatitis C   . Hypertension   . Left-sided weakness   . MRSA infection    of breast incision  . Opioid dependence ([place])    Has been treated in [facility] in past    Past Gynecological History:   GYNECOLOGIC HISTORY:  No LMP recorded. Patient has had a hysterectomy. Menarche: 62years old P 0010 LMP in her 68s when she underwent hysterectomy Contraceptive none HRT none  Last Pap not applicable  Family Hx:  Family History  Problem Relation Age of Onset  . Heart attack Mother    . Breast cancer Mother 33  . Dementia Mother   . Cancer Father 29       died of bleeding from kidneys    Review of Systems:  Review of Systems  Musculoskeletal: Positive for back pain and myalgias.  Neurological: Positive for dizziness.  All other systems reviewed and are negative. has some leg spasms left leg  Vitals:  Blood pressure (!) 164/65, pulse 66, temperature (!) 97.3 F (36.3 C), temperature source Oral, resp. rate 18, height 5' 2"  (1.575 m), weight 253 lb 6.4 oz (114.9 kg), SpO2 100 %. Body mass index is 46.35 kg/m.   Physical Exam: ECOG PERFORMANCE STATUS: 1 - Symptomatic but completely ambulatory   General :  OBESE Well developed, 63 y.o., female in no apparent distress HEENT:  Normocephalic/atraumatic, symmetric, EOMI, eyelids normal Neck:   Supple, no masses.  Lymphatics:  No cervical/ submandibular/ supraclavicular/ infraclavicular/ inguinal adenopathy Respiratory:  Respirations unlabored, no use of accessory muscles CV:   Deferred Breast:  Deferred Musculoskeletal: No CVA tenderness, normal muscle strength. Abdomen:  Obese, soft, non-tender and nondistended. No evidence of hernia. No masses. Extremities:  No lymphedema, no erythema, non-tender. Skin:   Normal inspection Neuro/Psych:  No focal motor deficit, no abnormal mental status. Normal gait. Normal affect. Alert and oriented to person, place, and time  Genito Urinary: Vulva: Normal external female genitalia.  Bladder/urethra: Urethral meatus normal in size and location. No lesions or   masses, well supported bladder Speculum exam: Vagina: No lesion, no discharge, no bleeding. Bimanual exam: Cervix/Uterus: Surgically absent  Adnexa: No masses. Limited exam given habitus Rectovaginal:  Good tone, no masses, no cul de sac nodularity, no parametrial involvement or nodularity.   ASSESSMENT Ovarian cyst Class 3 Morbid Obesity Stage IV chronic kidney disease H/o CHF  PLAN 1. Complexity of  visit o Established problem worsening  o Data reviewed i. Personally reviewed the ultrasound and CT scan including prior scans and compared the size of the lesion in the presence of the patient today ii. Obtained records including operative report and DC summary from 04/2018 admission/hernia repair iii. Reviewed radiology exams. iv. Ordered testing for preop  v. Ordered clearance testing o Risks of complications i. Chronic illness with progression ii. Major surgery is being arranged and she has significant morbidities including CHF, kidney disease, morbid  obesity, prior surgery of myomectomy and hysterectomy, recent surgery 2. Ovarian cyst o Appears homogeneous possibly simple on imaging that I reviewed and has normal Ca125 i. I agree this could possibly be an endometrioma o I offered continued observation versus surgery o The mass is increasing in size and she may be intermittently torsing. In addition she states she has pain as opposed to prior visits she denied. 3. We discussed the risks, benefits, and alternatives of surgery and she wishes to proceed o Surgical sketch was reviewed o She understands given her prior surgery she is at a higher than average risk for bowel and urinary tract injury. 4. Tentative plan is USO (ie mass resection) and if feasible/felt to be reasonably safe then BSO o If malignant on frozen section than BSO and staging as indicated. o If normal contralateral ovary has high risk of injury with removal then tube only removal on that "normal" side offered. 5. If we did proceed to surgery in the future we need to keep her mind that she would need surgical clearance and that she is in rehab for opioid dependence. 6. Also given the recent N/V admission felt to be due to a hernia (found intraop not to have incarceration of bowel) I wonder if she has been having episodes of torsion 7. PREOP items o Cardiac clearance o PCP to [place] opioid Rx for home o General surgeon that  placed the mesh needs to clear her. In theory there is potential for contamination of the surgical field if we encounter a bowel injury. o She will need to be placed on the main OR schedule given her risk of complicated recovery and risk of prolonged surgery with adhesive disease. 8. We offered her some dates here at [facility] versus trying to get her expedited at [facility]. She plans to wait for a [facility] date, which may put her in to January (for an Main OR surgery day that will match her schedule).   Isabel Caprice, MD  05/20/2018, 6:24 PM  Cc: Newton Pigg, MD (Referring OB/GYN) Delia Chimes, MD (PCP)

## 2018-05-23 ENCOUNTER — Telehealth: Payer: Self-pay | Admitting: Oncology

## 2018-05-23 ENCOUNTER — Other Ambulatory Visit: Payer: Self-pay | Admitting: Oncology

## 2018-05-23 ENCOUNTER — Other Ambulatory Visit: Payer: Self-pay | Admitting: Family Medicine

## 2018-05-23 ENCOUNTER — Encounter: Payer: Self-pay | Admitting: Oncology

## 2018-05-23 DIAGNOSIS — N83209 Unspecified ovarian cyst, unspecified side: Secondary | ICD-10-CM

## 2018-05-23 NOTE — Telephone Encounter (Signed)
Requested medication (s) are due for refill today: yes  Requested medication (s) are on the active medication list: yes  Last refill:  04/26/18  Future visit scheduled: yes  Notes to clinic:  Alpha-2 Agonists failed. Med was prescribed by a historical provider.  Requested Prescriptions  Pending Prescriptions Disp Refills   cloNIDine (CATAPRES) 0.1 MG tablet 60 tablet     Sig: Take by mouth.     Cardiovascular:  Alpha-2 Agonists Failed - 05/23/2018 11:36 AM      Failed - Last BP in normal range    BP Readings from Last 1 Encounters:  05/20/18 (!) 164/65         Passed - Last Heart Rate in normal range    Pulse Readings from Last 1 Encounters:  05/20/18 66         Passed - Valid encounter within last 6 months    Recent Outpatient Visits          1 week ago Chronic diastolic congestive heart failure ([place])   [facility] at [place], MD   3 months ago Left-sided weakness   [facility] at [place], MD   3 months ago Essential hypertension   [facility] at [facility] [place] [facility], MD   5 months ago Acute pain of left shoulder   Primary Care at [place], MD   5 months ago Essential hypertension   Primary Care at [place], MD      Future Appointments            In 1 month Forrest Moron, MD Primary Care at [place], [facility]

## 2018-05-23 NOTE — Telephone Encounter (Signed)
Copied from [place] [id number]. Topic: Quick Communication - Rx Refill/Question >> May 23, 2018 10:25 AM Carolyn Stare wrote: Medication    cloNIDine (CATAPRES) 0.1 MG tablet     pt said this med was given to her while in the hospital   Preferred Pharmacy     [facility] [address]   Agent: Please be advised that RX refills may take up to 3 business days. We ask that you follow-up with your pharmacy.

## 2018-05-23 NOTE — Telephone Encounter (Signed)
Faxed surgical clearance form to Dr. [name] at [phone].  Also called Dr. Claris Gladden office and placed urgent referral for patient for cardiac clearance.

## 2018-05-23 NOTE — Progress Notes (Unsigned)
amb  

## 2018-05-24 NOTE — Telephone Encounter (Signed)
Pt also needs her terazosin (HYTRIN) 2 MG capsule  She was put on this in the hospital...  [id number] - [place], [place] - [address] [place] [address] [phone] (Phone) [phone] (Fax)

## 2018-05-25 ENCOUNTER — Telehealth: Payer: Self-pay | Admitting: Oncology

## 2018-05-25 NOTE — Telephone Encounter (Signed)
Called Dr. Rae Lips office regarding surgical clearance form.  They said to refax it to [phone].  Form was refaxed.

## 2018-05-25 NOTE — Telephone Encounter (Signed)
The pt called inquiring about her clonidine and terazosin; records show that this was started when the pt was in the hospital; the pt states that in her appointment with Dr Delia Chimes, she was told that these medications would be continued until her appointment with Cardiology; the pt's best contact number is [phone]; her pharmary of choice is   [id number] - [place], [place] - [address]; will route to office for  final disposition.  Requested Prescriptions

## 2018-05-25 NOTE — Telephone Encounter (Signed)
Called [facility] to schedule appointment for cardiology clearance with Dr. Aundra Dubin.  They said they will review the referral and schedule patient.

## 2018-05-26 ENCOUNTER — Telehealth: Payer: Self-pay | Admitting: Oncology

## 2018-05-26 NOTE — Telephone Encounter (Signed)
Left a message for [facility] with appointment to see Dr. Aundra Dubin tomorrow.  Requested a return call.

## 2018-05-27 ENCOUNTER — Encounter: Payer: Self-pay | Admitting: Oncology

## 2018-05-27 ENCOUNTER — Encounter (HOSPITAL_COMMUNITY): Payer: Self-pay | Admitting: Cardiology

## 2018-05-27 ENCOUNTER — Other Ambulatory Visit: Payer: Self-pay | Admitting: Oncology

## 2018-05-27 DIAGNOSIS — N83209 Unspecified ovarian cyst, unspecified side: Secondary | ICD-10-CM

## 2018-05-27 NOTE — Telephone Encounter (Signed)
Appointment scheduled with Dr. Geraldo Pitter in [facility] for 06/13/18 at 2:40 pm. Left a message for patient with appointment time, date and location.

## 2018-05-27 NOTE — Telephone Encounter (Signed)
Sherry [name] again about cardiology appointment.  She said she will not be able to go because she needs a 3 day notice for medicaid transportation.  Advised her that we will try to get the appointment rescheduled and let her know.

## 2018-05-30 ENCOUNTER — Telehealth: Payer: Self-pay | Admitting: Family Medicine

## 2018-05-30 ENCOUNTER — Telehealth: Payer: Self-pay | Admitting: Oncology

## 2018-05-30 ENCOUNTER — Telehealth: Payer: Self-pay | Admitting: *Deleted

## 2018-05-30 NOTE — Telephone Encounter (Signed)
Patient called regarding her appts in December. Patient stated "I don't think I will be able to see the heart doctor on December 2nd then be scheduled to see the doctor that puts you sleep and have surgery by December 5th. I need to have three days to arrange transportation." Per Melissa APP we are trying to schedule the patient for the cardiology for December 2nd and see pre-op on December 3rd. Information given to the patient. Patient asked "Can you try to have the appt scheduled for December 4th, I already have an appt on December 3rd. Transportation isn't always the best when we schedule multiple appts the same day." Advised the patient that I will inform Melissa APP

## 2018-05-30 NOTE — Telephone Encounter (Signed)
Sherry [name] and advised her of appointment with Dr. Geraldo Pitter on 06/13/2018.  She asked if she would still be able to have surgery on 06/16/18 and said she also needs to meet with the Anesthesiologist before then.  Discussed that there is also the 07/21/2018 surgery date available.  She said she would like to discuss it with Joylene John, NP.

## 2018-05-30 NOTE — Addendum Note (Signed)
Addended by: Addison Naegeli on: 05/30/2018 02:43 PM   Modules accepted: Orders

## 2018-05-30 NOTE — Telephone Encounter (Signed)
Copied from [place] [id number]. Topic: Quick Communication - Rx Refill/Question >> May 30, 2018  1:15 PM Virl Axe D wrote: Medication: labetalol (NORMODYNE) 300 MG tablet   Has the patient contacted their pharmacy? Yes.   (Agent: If no, request that the patient contact the pharmacy for the refill.) (Agent: If yes, when and what did the pharmacy advise?)  Preferred Pharmacy (with phone number or street name): [id number] - [place], [place] - [address] AT [place] [phone] (Phone) [phone] (Fax)    Agent: Please be advised that RX refills may take up to 3 business days. We ask that you follow-up with your pharmacy.

## 2018-05-30 NOTE — Telephone Encounter (Signed)
Sherry [name] and let her know that 06/15/18 at 10:00 am is being held for her pre-op appointment.  She verbalized understanding and agreement.

## 2018-05-30 NOTE — Telephone Encounter (Signed)
Pt called to inquire if this Rx would be refilled  cloNIDine (CATAPRES) 0.1 MG tablet   Please advise Pt CB# [phone]

## 2018-05-30 NOTE — Telephone Encounter (Signed)
Pt called to inquire if this Rx would be refilled. terazosin (HYTRIN) 2 MG capsule  Please contact pt. [phone]

## 2018-05-30 NOTE — Telephone Encounter (Signed)
Requested medication (s) are due for refill today: Yes  Requested medication (s) are on the active medication list: Yes  Last refill:  04/26/18 by historical provider  Future visit scheduled: Yes  Notes to clinic:  Unable to refill due to last refilled by historical provider.     Requested Prescriptions  Pending Prescriptions Disp Refills   cloNIDine (CATAPRES) 0.1 MG tablet 60 tablet     Sig: Take by mouth.     Cardiovascular:  Alpha-2 Agonists Failed - 05/30/2018  2:43 PM      Failed - Last BP in normal range    BP Readings from Last 1 Encounters:  05/20/18 (!) 164/65         Passed - Last Heart Rate in normal range    Pulse Readings from Last 1 Encounters:  05/20/18 66         Passed - Valid encounter within last 6 months    Recent Outpatient Visits          2 weeks ago Chronic diastolic congestive heart failure ([place])   Primary Care at [place], MD   3 months ago Left-sided weakness   Primary Care at [place], MD   4 months ago Essential hypertension   [facility] at [facility], MD   5 months ago Acute pain of left shoulder   Primary Care at [place], MD   6 months ago Essential hypertension   Primary Care at [place], MD      Future Appointments            In 3 weeks Forrest Moron, MD Primary Care at [place], [facility]

## 2018-05-31 ENCOUNTER — Telehealth: Payer: Self-pay | Admitting: Family Medicine

## 2018-05-31 ENCOUNTER — Telehealth: Payer: Self-pay | Admitting: Oncology

## 2018-05-31 MED ORDER — TERAZOSIN HCL 2 MG PO CAPS: 2.0000 mg | ORAL_CAPSULE | Freq: Every day | ORAL | 0 refills | Status: DC

## 2018-05-31 MED ORDER — LABETALOL HCL 100 MG PO TABS: 100.0000 mg | ORAL_TABLET | Freq: Two times a day (BID) | ORAL | 3 refills | Status: DC

## 2018-05-31 NOTE — Telephone Encounter (Signed)
Sherry [name] and notified her that the OR is full on 06/16/18 and asked if she would prefer 07/05/18 or 07/12/18 as her surgery date.  She said 07/05/18 would work for her.  Advised her to keep her appointment with cardiology on 12/2 and preop on 12/3.  She verbalized understanding and agreement.

## 2018-05-31 NOTE — Telephone Encounter (Signed)
Copied from [place] [id number]. Topic: Quick Communication - Rx Refill/Question >> May 31, 2018  8:25 AM Charlynn Court wrote: Medication: cloNIDine (CATAPRES) 0.1 MG tablet  labetalol (NORMODYNE) 100 MG tablet terazosin (HYTRIN) 2 MG capsule    Has the patient contacted their pharmacy? Yes.   (Agent: If no, request that the patient contact the pharmacy for the refill.) (Agent: If yes, when and what did the pharmacy advise?)  Preferred Pharmacy (with phone number or street name): [facility] #[id number] - [place], [place] - [address]  Agent: Please be advised that RX refills may take up to 3 business days. We ask that you follow-up with your pharmacy.

## 2018-05-31 NOTE — Telephone Encounter (Signed)
Refills sent until patient can have medication optimization with Cardiology.

## 2018-05-31 NOTE — Telephone Encounter (Signed)
Austin Miles., Agent, called [facility]; spoke with Britt Boozer, nurse for Dr. Nolon Rod.  Will discuss requested refills with PCP, and contact pt.  Delores stated there is no need to send this note through for refill requests. She will handle it.

## 2018-05-31 NOTE — Addendum Note (Signed)
Addended by: Delia Chimes A on: 05/31/2018 01:29 PM   Modules accepted: Orders

## 2018-05-31 NOTE — Telephone Encounter (Signed)
Pt calling again.  States that she has been calling about this daily and she has been out of medication since the earlier part of last week.  Pt is concerned and upset that medication has not been called in. Pt can be reached at [phone].

## 2018-06-01 NOTE — Telephone Encounter (Signed)
Please advise. Dgaddy, CMA 

## 2018-06-02 ENCOUNTER — Telehealth: Payer: Self-pay | Admitting: Oncology

## 2018-06-02 ENCOUNTER — Ambulatory Visit (HOSPITAL_COMMUNITY): Payer: Self-pay | Admitting: Cardiology

## 2018-06-02 NOTE — Telephone Encounter (Signed)
Called Dr. Rae Lips office to check on status of surgical clearance form.  They said they do not have it.  Faxed again to [phone]. Fax confirmation received.

## 2018-06-03 NOTE — Telephone Encounter (Signed)
Pt waiting on a call back.

## 2018-06-03 NOTE — Telephone Encounter (Signed)
Called Dr. Rae Lips office and they have received the clearance form.  Dr. Rae Lips nurse will send it to him to be signed.

## 2018-06-05 MED ORDER — CLONIDINE HCL 0.1 MG PO TABS: 0.1000 mg | ORAL_TABLET | Freq: Three times a day (TID) | ORAL | 3 refills | Status: DC

## 2018-06-05 NOTE — Telephone Encounter (Signed)
Left voicemail at 5:45pm at the call back number [phone]  Refills sent in for clonidine which was previously ordered at clonidine 0.1mg  tid

## 2018-06-06 ENCOUNTER — Telehealth: Payer: Self-pay | Admitting: Family Medicine

## 2018-06-06 NOTE — Telephone Encounter (Signed)
Called and spoke with pt regarding her appt on 06/24/18 with Dr. Nolon Rod. Due to provider schedule change, pt will need to reschedule. Pt stated that she was not at home and would call us back tomorrow and reschedule. When pt calls in, please reschedule with [facility] for a 6 week F/U. Thank you!

## 2018-06-07 ENCOUNTER — Telehealth: Payer: Self-pay | Admitting: Oncology

## 2018-06-07 NOTE — Telephone Encounter (Signed)
Called Dr. Rae Lips office and left a message for his medical assistant regarding status of surgical clearance.

## 2018-06-08 ENCOUNTER — Telehealth: Payer: Self-pay | Admitting: *Deleted

## 2018-06-08 NOTE — Telephone Encounter (Signed)
Patient is scheduled for a pre-op appt on December 4th at 10am.

## 2018-06-11 ENCOUNTER — Other Ambulatory Visit: Payer: Self-pay | Admitting: Family Medicine

## 2018-06-13 ENCOUNTER — Encounter: Payer: Self-pay | Admitting: Cardiology

## 2018-06-13 ENCOUNTER — Telehealth: Payer: Self-pay

## 2018-06-13 ENCOUNTER — Ambulatory Visit (INDEPENDENT_AMBULATORY_CARE_PROVIDER_SITE_OTHER): Payer: Medicare HMO | Admitting: Cardiology

## 2018-06-13 ENCOUNTER — Telehealth: Payer: Self-pay | Admitting: Family Medicine

## 2018-06-13 VITALS — BP 122/84 | HR 60 | Ht 62.0 in | Wt 244.0 lb

## 2018-06-13 DIAGNOSIS — Z8619 Personal history of other infectious and parasitic diseases: Secondary | ICD-10-CM

## 2018-06-13 DIAGNOSIS — N183 Chronic kidney disease, stage 3 unspecified: Secondary | ICD-10-CM

## 2018-06-13 DIAGNOSIS — I10 Essential (primary) hypertension: Secondary | ICD-10-CM

## 2018-06-13 DIAGNOSIS — Z01810 Encounter for preprocedural cardiovascular examination: Secondary | ICD-10-CM

## 2018-06-13 HISTORY — DX: Encounter for preprocedural cardiovascular examination: Z01.810

## 2018-06-13 NOTE — Telephone Encounter (Signed)
Copied from [place] [id number]. Topic: General - Other >> Jun 13, 2018  4:53 PM Parke Poisson wrote: Reason for CRM: Pt is requesting that Hassan Rowan call her.She would not give any information  Pt req call re: Gabapentin was supposed to be 600mg  instead of 800mg  - which ins will not cover.   Sent to Dr. Nolon Rod

## 2018-06-13 NOTE — Telephone Encounter (Signed)
See patient's request below about Neurontin being changed to 600mg 

## 2018-06-13 NOTE — Telephone Encounter (Signed)
Patient phoned again requesting for a change in the gabapentin dosage to 600 mg due to her insurance not paying for the 800 mg at this time, too early for pick up. See patient's note for explanation.

## 2018-06-13 NOTE — Telephone Encounter (Signed)
Copied from [place] [id number]. Topic: Quick Communication - See Telephone Encounter >> Jun 13, 2018 12:04 PM Rutherford Nail, NT wrote: CRM for notification. See Telephone encounter for: 06/13/18. Patient calling and would like to know if the Gabapentin can be changed to 600 MG? States that medicare will not pay for another prescription due to not being able to refill until 06/25/18. States that in October she has 2 hernia repair surgeries and was not sent home on any pain medication due to having the gabapentin. States that some days she has had to take 4 pills a day instead of the 3 pills as prescribed. Please advise.  CB#: [phone]

## 2018-06-13 NOTE — Patient Instructions (Signed)
Medication Instructions:  Your physician recommends that you continue on your current medications as directed. Please refer to the Current Medication list given to you today.  If you need a refill on your cardiac medications before your next appointment, please call your pharmacy.   Lab work: None If you have labs (blood work) drawn today and your tests are completely normal, you will receive your results only by: Marland Kitchen MyChart Message (if you have MyChart) OR . A paper copy in the mail If you have any lab test that is abnormal or we need to change your treatment, we will call you to review the results.  Testing/Procedures: Your physician has requested that you have an echocardiogram. Echocardiography is a painless test that uses sound waves to create images of your heart. It provides your doctor with information about the size and shape of your heart and how well your heart's chambers and valves are working. This procedure takes approximately one hour. There are no restrictions for this procedure.  Your physician has requested that you have a lexiscan myoview. For further information please visit [email or web address]. Please follow instruction sheet, as given.   Follow-Up: At [facility], you and your health needs are our priority.  As part of our continuing mission to provide you with exceptional heart care, we have created designated Provider Care Teams.  These Care Teams include your primary Cardiologist (physician) and Advanced Practice Providers (APPs -  Physician Assistants and Nurse Practitioners) who all work together to provide you with the care you need, when you need it. You will need a follow up appointment in 6 months.  Please call our office 2 months in advance to schedule this appointment.  You may see No primary care provider on file. [place]  Phone: [phone] or another member of our [facility] Provider Team in [place]: Jenne Campus, MD . Shirlee More,  MD  Any Other Special Instructions Will Be Listed Below (If Applicable).

## 2018-06-13 NOTE — Telephone Encounter (Signed)
Pt called and stated she is out of this medication and would like the Rx sent to  [facility] #[id number] - [place], [place] - [address] AT [place]    [phone] (Phone) [phone] (Fax)  Please advise

## 2018-06-13 NOTE — Progress Notes (Signed)
Cardiology Office Note:    Date:  06/13/2018   ID:  Sherry [name], DOB 03-19-1956, MRN [id number]  PCP:  Forrest Moron, MD  Cardiologist:  Jenean Lindau, MD   Referring MD: Forrest Moron, MD    ASSESSMENT:    1. Pre-operative cardiovascular examination   2. Essential hypertension   3. Chronic kidney disease, stage III (moderate) (HCC)   4. Hepatitis C virus infection cured after antiviral drug therapy   5. Morbid obesity due to excess calories ([place])    PLAN:    In order of problems listed above:  1. Primary prevention stressed with the patient.  Importance of compliance with diet and medication stressed and she vocalized understanding.  Her blood pressure is stable.  Diet was discussed for obesity and risks of obesity explained. 2. In view of multiple risk factors for coronary artery disease she will undergo Lexiscan sestamibi.  If this is negative she is not at high risk for coronary events during the aforementioned procedure.  Meticulous hemodynamic monitoring and continued perioperative beta-blockade will further reduce the risk of coronary event. 3. Echocardiogram will be done to assess murmur heard on auscultation 4. She will be seen in follow-up appointment on a as needed basis only.  Patient had multiple questions which were answered to her satisfaction.   Medication Adjustments/Labs and Tests Ordered: Current medicines are reviewed at length with the patient today.  Concerns regarding medicines are outlined above.  No orders of the defined types were placed in this encounter.  No orders of the defined types were placed in this encounter.    History of Present Illness:    Sherry [name] is a 62 y.o. female who is being seen today for the evaluation of preop cardiovascular evaluation at the request of Forrest Moron, MD.  Patient is a pleasant 62 year old female.  She has past medical history of essential hypertension, significant advanced renal insufficiency is  evaluated for preop cardiovascular risk stratification.  Patient has history of substance abuse.  She mentions to me that she is planning to undergo abdominal surgery for removal of a cyst.  She denies any chest pain orthopnea or PND.  Is a sedentary lifestyle.  She is previously unknown to me.  At the time of my evaluation, the patient is alert awake oriented and in no distress.   Past Medical History:  Diagnosis Date  . Anxiety   . Bipolar and related disorder ([place])   . Breast cancer ([place])   . CHF (congestive heart failure) ([place])   . Chronic back pain   . Chronic kidney disease    "Stage IV" - states due to hypertension  . Hepatitis C   . Hypertension   . Left-sided weakness   . MRSA infection    of breast incision  . Opioid dependence ([place])    Has been [facility] in [facility] in past    Past Surgical History:  Procedure Laterality Date  . BACK SURGERY  1990  . BREAST SURGERY     "breast cancer survivor" - states partial mastectomy and nodes  . LAPAROSCOPIC CHOLECYSTECTOMY  2002  . LAPAROSCOPIC INCISIONAL / UMBILICAL / VENTRAL HERNIA REPAIR  04/13/2018   with BARD 15x 78MV mesh (supraumbilical)  . MYOMECTOMY     x 2 prior to hysterectomy  . TOTAL ABDOMINAL HYSTERECTOMY     fibroids     Current Medications: Current Meds  Medication Sig  . amLODipine (NORVASC) 10 MG tablet TAKE 1 TABLET BY  MOUTH DAILY  . BELSOMRA 10 MG TABS Take 1 tablet by mouth at bedtime.  . Blood Pressure Monitoring (BLOOD PRESSURE MONITOR/WRIST) DEVI   . cloNIDine (CATAPRES) 0.1 MG tablet Take 1 tablet (0.1 mg total) by mouth 3 (three) times daily.  . ferrous sulfate 325 (65 FE) MG tablet Take 1 tablet (325 mg total) by mouth 2 (two) times daily with a meal.  . FLUoxetine (PROZAC) 20 MG tablet Take 20 mg by mouth daily.  Marland Kitchen gabapentin (NEURONTIN) 800 MG tablet TAKE 1 TABLET(800 MG) BY MOUTH THREE TIMES DAILY  . hydrALAZINE (APRESOLINE) 100 MG tablet Take 100 mg by mouth 2 (two) times daily.   .  Incontinence Supply Disposable (BLADDER CONTROL PADS EX ABSORB) MISC   . INGREZZA 80 MG CAPS Take 80 mg by mouth daily.  . isosorbide mononitrate (IMDUR) 60 MG 24 hr tablet TAKE 1 TABLET BY MOUTH DAILY  . labetalol (NORMODYNE) 100 MG tablet Take 1 tablet (100 mg total) by mouth 2 (two) times daily.  . Multiple Vitamins-Minerals (ADULT GUMMY) CHEW Chew 1 tablet by mouth daily.  Marland Kitchen oxybutynin (DITROPAN-XL) 5 MG 24 hr tablet Take 1 tablet (5 mg total) by mouth at bedtime.  . pantoprazole (PROTONIX) 20 MG tablet Take 1 tablet (20 mg total) by mouth 2 (two) times daily.  . solifenacin (VESICARE) 10 MG tablet Take 1 tablet (10 mg total) by mouth daily.  Marland Kitchen terazosin (HYTRIN) 2 MG capsule Take 1 capsule (2 mg total) by mouth at bedtime.     Allergies:   Abilify [aripiprazole]; Remeron [mirtazapine]; Trazodone and nefazodone; Flexeril [cyclobenzaprine]; and Amoxicillin   Social History   Socioeconomic History  . Marital status: Single    Spouse name: Not on file  . Number of children: 0  . Years of education: 71  . Highest education level: High school graduate  Occupational History  . Occupation: Disabled  Social Needs  . Financial resource strain: Not on file  . Food insecurity:    Worry: Not on file    Inability: Not on file  . Transportation needs:    Medical: Not on file    Non-medical: Not on file  Tobacco Use  . Smoking status: Current Every Day Smoker    Packs/day: 1.00    Types: Cigarettes  . Smokeless tobacco: Never Used  Substance and Sexual Activity  . Alcohol use: No  . Drug use: Not Currently    Comment: h/o IVD use  . Sexual activity: Not on file  Lifestyle  . Physical activity:    Days per week: Not on file    Minutes per session: Not on file  . Stress: Not on file  Relationships  . Social connections:    Talks on phone: Not on file    Gets together: Not on file    Attends religious service: Not on file    Active member of club or organization: Not on file     Attends meetings of clubs or organizations: Not on file    Relationship status: Not on file  Other Topics Concern  . Not on file  Social History Narrative   Lives at home alone.   Right-handed.   Caffeine use: 4 cups coffee/soda     Family History: The patient's family history includes Breast cancer (age of onset: 7) in her mother; Cancer (age of onset: 93) in her father; Dementia in her mother; Heart attack in her mother.  ROS:   Please see the history of present illness.  All other systems reviewed and are negative.  EKGs/Labs/Other Studies Reviewed:    The following studies were reviewed today: EKG reveals sinus rhythm and nonspecific ST-T changes.   Recent Labs: 10/21/2017: B Natriuretic Peptide 355.4; TSH 0.882 12/01/2017: Hemoglobin 10.3; Platelets 310 05/13/2018: ALT 7; BUN 28; Creatinine, Ser 2.02; Potassium 5.1; Sodium 140  Recent Lipid Panel No results found for: CHOL, TRIG, HDL, CHOLHDL, VLDL, LDLCALC, LDLDIRECT  Physical Exam:    VS:  BP 122/84 (BP Location: Right Arm, Patient Position: Sitting, Cuff Size: Normal)   Pulse 60   Ht 5\' 2"  (1.575 m)   Wt 244 lb (110.7 kg)   SpO2 98%   BMI 44.63 kg/m     Wt Readings from Last 3 Encounters:  06/13/18 244 lb (110.7 kg)  05/20/18 253 lb 6.4 oz (114.9 kg)  05/13/18 252 lb 12.8 oz (114.7 kg)     GEN: Patient is in no acute distress HEENT: Normal NECK: No JVD; No carotid bruits LYMPHATICS: No lymphadenopathy CARDIAC: S1 S2 regular, 2/6 systolic murmur at the apex. RESPIRATORY:  Clear to auscultation without rales, wheezing or rhonchi  ABDOMEN: Soft, non-tender, non-distended MUSCULOSKELETAL:  No edema; No deformity  SKIN: Warm and dry NEUROLOGIC:  Alert and oriented x 3 PSYCHIATRIC:  Normal affect    Signed, Jenean Lindau, MD  06/13/2018 2:44 PM    [place]

## 2018-06-14 ENCOUNTER — Other Ambulatory Visit: Payer: Self-pay

## 2018-06-14 MED ORDER — GABAPENTIN 600 MG PO TABS: 600.0000 mg | ORAL_TABLET | Freq: Three times a day (TID) | ORAL | 1 refills | Status: DC

## 2018-06-14 NOTE — Addendum Note (Signed)
Addended by: Jerl Santos R on: 06/14/2018 10:53 AM   Modules accepted: Orders

## 2018-06-14 NOTE — Telephone Encounter (Signed)
Medication has been changed and resent to pharmacy.

## 2018-06-16 ENCOUNTER — Telehealth: Payer: Self-pay | Admitting: Oncology

## 2018-06-16 NOTE — Telephone Encounter (Signed)
Called [facility] and asked if stress test can be moved up to for surgery on 06/29/18.  They said they do not have any openings before 06/28/18.

## 2018-06-17 ENCOUNTER — Telehealth: Payer: Self-pay | Admitting: Oncology

## 2018-06-17 NOTE — Telephone Encounter (Signed)
Left a message for Sherry [name] with 07/12/18 at 7:30 am.  Requested a return call.

## 2018-06-17 NOTE — Telephone Encounter (Signed)
Correction has been made

## 2018-06-20 ENCOUNTER — Telehealth: Payer: Self-pay | Admitting: Oncology

## 2018-06-20 NOTE — Telephone Encounter (Signed)
Sherry [name] and discussed surgery on 07/12/18 at 7:30 am.  She asked if she would be going home or staying over night.  Advised her that per Joylene John, NP, she may go home depending on how she does with surgery.  [name] verbalized understanding and agreement.

## 2018-06-21 ENCOUNTER — Ambulatory Visit (HOSPITAL_BASED_OUTPATIENT_CLINIC_OR_DEPARTMENT_OTHER)
Admission: RE | Admit: 2018-06-21 | Discharge: 2018-06-21 | Disposition: A | Discharge status: Home / Self Care | Payer: Medicare HMO | Source: Ambulatory Visit | Attending: Cardiology | Admitting: Cardiology

## 2018-06-21 DIAGNOSIS — Z01810 Encounter for preprocedural cardiovascular examination: Secondary | ICD-10-CM | POA: Diagnosis present

## 2018-06-21 NOTE — Progress Notes (Signed)
  Echocardiogram 2D Echocardiogram has been performed.  Sherry [name] 06/21/2018, 1:42 PM

## 2018-06-23 ENCOUNTER — Telehealth (HOSPITAL_COMMUNITY): Payer: Self-pay

## 2018-06-23 NOTE — Telephone Encounter (Signed)
Encounter complete. 

## 2018-06-24 ENCOUNTER — Telehealth (HOSPITAL_COMMUNITY): Payer: Self-pay

## 2018-06-24 ENCOUNTER — Ambulatory Visit: Payer: Medicare HMO | Admitting: Family Medicine

## 2018-06-24 NOTE — Telephone Encounter (Signed)
Encounter complete. 

## 2018-06-24 NOTE — Progress Notes (Addendum)
06-21-18  (Epic) ECHO  06-13-18 ( Epic) EKG  10-21-17 ( (Epic) CXR   06-27-18 CMP, UA, and PCR results routed to Dr. Gerarda Fraction for review.

## 2018-06-24 NOTE — Patient Instructions (Addendum)
Sherry [name]  06/24/2018   Your procedure is scheduled on: 07-12-18    Report to [facility] Main  Entrance    Report to admitting at 5:30 AM    Call this number if you have problems the morning of surgery [phone]   Eat a light diet the day before surgery.  Examples including soups, broths, toast, yogurt, mashed potatoes.  Things to avoid include carbonated beverages (fizzy beverages), raw fruits and raw vegetables, or beans.   If your bowels are filled with gas, your surgeon will have difficulty visualizing your pelvic organs which increases your surgical risks.     Remember: NO SOLID FOOD AFTER MIDNIGHT THE NIGHT PRIOR TO SURGERY. NOTHING BY MOUTH EXCEPT CLEAR LIQUIDS UNTIL 3 HOURS PRIOR TO [name] SURGERY. PLEASE FINISH ENSURE DRINK PER SURGEON ORDER 3 HOURS PRIOR TO SCHEDULED SURGERY TIME WHICH NEEDS TO BE COMPLETED AT 4:30 AM.   CLEAR LIQUID DIET   Foods Allowed                                                                     Foods Excluded  Coffee and tea, regular and decaf                             liquids that you cannot  Plain Jell-O in any flavor                                             see through such as: Fruit ices (not with fruit pulp)                                     milk, soups, orange juice  Iced Popsicles                                    All solid food Carbonated beverages, regular and diet                                    Cranberry, grape and apple juices Sports drinks like Gatorade Lightly seasoned clear broth or consume(fat free) Sugar, honey syrup  Sample Menu Breakfast                                Lunch                                     Supper Cranberry juice                    Beef broth  Chicken broth Jell-O                                     Grape juice                           Apple juice Coffee or tea                        Jell-O                                       Popsicle                                                Coffee or tea                        Coffee or tea  _____________________________________________________________________       BRUSH YOUR TEETH MORNING OF SURGERY AND RINSE YOUR MOUTH OUT, NO CHEWING GUM CANDY OR MINTS.     Take these medicines the morning of surgery with A SIP OF WATER: Amlodipine (Norvasc), Clonidine (Catapres), Gabapentin (Neurontin), Hydralazine (Apresoline), Isosorbide Mononitrate (Imdur), Labetalol (Normodyne) and Pantoprazole (Protonix)                               You may not have any metal on your body including hair pins and              piercings  Do not wear jewelry, make-up, lotions, powders or perfumes, deodorant             Do not wear nail polish.  Do not shave  48 hours prior to surgery.              Do not bring valuables to the hospital. [name]  Contacts, dentures or bridgework may not be worn into surgery.  Leave suitcase in the car. After surgery it may be brought to your room.      Special Instructions: Cough and Deep Breath Exercise             Please read over the following fact sheets you were given: _____________________________________________________________________             [name] - Preparing for Surgery Before surgery, you can play an important role.  Because skin is not sterile, your skin needs to be as free of germs as possible.  You can reduce the number of germs on your skin by washing with CHG (chlorahexidine gluconate) soap before surgery.  CHG is an antiseptic cleaner which kills germs and bonds with the skin to continue killing germs even after washing. Please DO NOT use if you have an allergy to CHG or antibacterial soaps.  If your skin becomes reddened/irritated stop using the CHG and inform your nurse when you arrive at Short Stay. Do not shave (including legs and underarms) for at least 48 hours prior to  the first  CHG shower.  You may shave your face/neck. Please follow these instructions carefully:  1.  Shower with CHG Soap the night before surgery and the  morning of Surgery.  2.  If you choose to wash your hair, wash your hair first as usual with your  normal  shampoo.  3.  After you shampoo, rinse your hair and body thoroughly to remove the  shampoo.                           4.  Use CHG as you would any other liquid soap.  You can apply chg directly  to the skin and wash                       Gently with a scrungie or clean washcloth.  5.  Apply the CHG Soap to your body ONLY FROM THE NECK DOWN.   Do not use on face/ open                           Wound or open sores. Avoid contact with eyes, ears mouth and genitals (private parts).                       Wash face,  Genitals (private parts) with your normal soap.             6.  Wash thoroughly, paying special attention to the area where your surgery  will be performed.  7.  Thoroughly rinse your body with warm water from the neck down.  8.  DO NOT shower/wash with your normal soap after using and rinsing off  the CHG Soap.                9.  Pat yourself dry with a clean towel.            10.  Wear clean pajamas.            11.  Place clean sheets on your bed the night of your first shower and do not  sleep with pets. Day of Surgery : Do not apply any lotions/deodorants the morning of surgery.  Please wear clean clothes to the hospital/surgery center.  FAILURE TO FOLLOW THESE INSTRUCTIONS MAY RESULT IN THE CANCELLATION OF YOUR SURGERY PATIENT SIGNATURE_________________________________  NURSE SIGNATURE__________________________________  ________________________________________________________________________   Sherry [name]  An incentive spirometer is a tool that can help keep your lungs clear and active. This tool measures how well you are filling your lungs with each breath. Taking long deep breaths may help reverse or  decrease the chance of developing breathing (pulmonary) problems (especially infection) following:  A long period of time when you are unable to move or be active. BEFORE THE PROCEDURE   If the spirometer includes an indicator to show your best effort, your nurse or respiratory therapist will set it to a desired goal.  If possible, sit up straight or lean slightly forward. Try not to slouch.  Hold the incentive spirometer in an upright position. INSTRUCTIONS FOR USE  1. Sit on the edge of your bed if possible, or sit up as far as you can in bed or on a chair. 2. Hold the incentive spirometer in an upright position. 3. Breathe out normally. 4. Place the mouthpiece in your mouth and seal your lips tightly around it. 5. Breathe in slowly and as deeply  as possible, raising the piston or the ball toward the top of the column. 6. Hold your breath for 3-5 seconds or for as long as possible. Allow the piston or ball to fall to the bottom of the column. 7. Remove the mouthpiece from your mouth and breathe out normally. 8. Rest for a few seconds and repeat Steps 1 through 7 at least 10 times every 1-2 hours when you are awake. Take your time and take a few normal breaths between deep breaths. 9. The spirometer may include an indicator to show your best effort. Use the indicator as a goal to work toward during each repetition. 10. After each set of 10 deep breaths, practice coughing to be sure your lungs are clear. If you have an incision (the cut made at the time of surgery), support your incision when coughing by placing a pillow or rolled up towels firmly against it. Once you are able to get out of bed, walk around indoors and cough well. You may stop using the incentive spirometer when instructed by your caregiver.  RISKS AND COMPLICATIONS  Take your time so you do not get dizzy or light-headed.  If you are in pain, you may need to take or ask for pain medication before doing incentive spirometry.  It is harder to take a deep breath if you are having pain. AFTER USE  Rest and breathe slowly and easily.  It can be helpful to keep track of a log of your progress. Your caregiver can provide you with a simple table to help with this. If you are using the spirometer at home, follow these instructions: [place] IF:   You are having difficultly using the spirometer.  You have trouble using the spirometer as often as instructed.  Your pain medication is not giving enough relief while using the spirometer.  You develop fever of 100.5 F (38.1 C) or higher. SEEK IMMEDIATE MEDICAL CARE IF:   You cough up bloody sputum that had not been present before.  You develop fever of 102 F (38.9 C) or greater.  You develop worsening pain at or near the incision site. MAKE SURE YOU:   Understand these instructions.  Will watch your condition.  Will get help right away if you are not doing well or get worse. Document Released: 11/09/2006 Document Revised: 09/21/2011 Document Reviewed: 01/10/2007 ExitCare Patient Information 2014 ExitCare, [place].   ________________________________________________________________________  WHAT IS A BLOOD TRANSFUSION? Blood Transfusion Information  A transfusion is the replacement of blood or some of its parts. Blood is made up of multiple cells which provide different functions.  Red blood cells carry oxygen and are used for blood loss replacement.  White blood cells fight against infection.  Platelets control bleeding.  Plasma helps clot blood.  Other blood products are available for specialized needs, such as hemophilia or other clotting disorders. BEFORE THE TRANSFUSION  Who gives blood for transfusions?   Healthy volunteers who are fully evaluated to make sure their blood is safe. This is blood bank blood. Transfusion therapy is the safest it has ever been in the practice of medicine. Before blood is taken from a donor, a complete  history is taken to make sure that person has no history of diseases nor engages in risky social behavior (examples are intravenous drug use or sexual activity with multiple partners). The donor's travel history is screened to minimize risk of transmitting infections, such as malaria. The donated blood is tested for signs of infectious diseases,  such as HIV and hepatitis. The blood is then tested to be sure it is compatible with you in order to minimize the chance of a transfusion reaction. If you or a relative donates blood, this is often done in anticipation of surgery and is not appropriate for emergency situations. It takes many days to process the donated blood. RISKS AND COMPLICATIONS Although transfusion therapy is very safe and saves many lives, the main dangers of transfusion include:   Getting an infectious disease.  Developing a transfusion reaction. This is an allergic reaction to something in the blood you were given. Every precaution is taken to prevent this. The decision to have a blood transfusion has been considered carefully by your caregiver before blood is given. Blood is not given unless the benefits outweigh the risks. AFTER THE TRANSFUSION  Right after receiving a blood transfusion, you will usually feel much better and more energetic. This is especially true if your red blood cells have gotten low (anemic). The transfusion raises the level of the red blood cells which carry oxygen, and this usually causes an energy increase.  The nurse administering the transfusion will monitor you carefully for complications. HOME CARE INSTRUCTIONS  No special instructions are needed after a transfusion. You may find your energy is better. Speak with your caregiver about any limitations on activity for underlying diseases you may have. SEEK MEDICAL CARE IF:   Your condition is not improving after your transfusion.  You develop redness or irritation at the intravenous (IV) site. SEEK  IMMEDIATE MEDICAL CARE IF:  Any of the following symptoms occur over the next 12 hours:  Shaking chills.  You have a temperature by mouth above 102 F (38.9 C), not controlled by medicine.  Chest, back, or muscle pain.  People around you feel you are not acting correctly or are confused.  Shortness of breath or difficulty breathing.  Dizziness and fainting.  You get a rash or develop hives.  You have a decrease in urine output.  Your urine turns a dark color or changes to pink, red, or brown. Any of the following symptoms occur over the next 10 days:  You have a temperature by mouth above 102 F (38.9 C), not controlled by medicine.  Shortness of breath.  Weakness after normal activity.  The white part of the eye turns yellow (jaundice).  You have a decrease in the amount of urine or are urinating less often.  Your urine turns a dark color or changes to pink, red, or brown. Document Released: 06/26/2000 Document Revised: 09/21/2011 Document Reviewed: 02/13/2008 [facility] Patient Information 2014 [place], [place].  _______________________________________________________________________

## 2018-06-27 ENCOUNTER — Encounter (HOSPITAL_COMMUNITY): Payer: Self-pay

## 2018-06-27 ENCOUNTER — Other Ambulatory Visit: Payer: Self-pay

## 2018-06-27 ENCOUNTER — Encounter (HOSPITAL_COMMUNITY)
Admission: RE | Admit: 2018-06-27 | Discharge: 2018-06-27 | Disposition: A | Discharge status: Home / Self Care | Payer: Medicare HMO | Source: Ambulatory Visit | Attending: Obstetrics | Admitting: Obstetrics

## 2018-06-27 DIAGNOSIS — Z01812 Encounter for preprocedural laboratory examination: Secondary | ICD-10-CM

## 2018-06-27 DIAGNOSIS — Z01810 Encounter for preprocedural cardiovascular examination: Secondary | ICD-10-CM | POA: Diagnosis not present

## 2018-06-27 LAB — URINALYSIS, ROUTINE W REFLEX MICROSCOPIC
Bilirubin Urine: NEGATIVE
Glucose, UA: NEGATIVE mg/dL
Hgb urine dipstick: NEGATIVE
Ketones, ur: NEGATIVE mg/dL
Leukocytes, UA: NEGATIVE
Nitrite: NEGATIVE
Protein, ur: 100 mg/dL — AB
Specific Gravity, Urine: 1.015 (ref 1.005–1.030)
pH: 5 (ref 5.0–8.0)

## 2018-06-27 LAB — CBC
HCT: 34.4 % — ABNORMAL LOW (ref 36.0–46.0)
Hemoglobin: 10.3 g/dL — ABNORMAL LOW (ref 12.0–15.0)
MCH: 27.8 pg (ref 26.0–34.0)
MCHC: 29.9 g/dL — ABNORMAL LOW (ref 30.0–36.0)
MCV: 93 fL (ref 80.0–100.0)
Platelets: 272 10*3/uL (ref 150–400)
RBC: 3.7 MIL/uL — ABNORMAL LOW (ref 3.87–5.11)
RDW: 14.6 % (ref 11.5–15.5)
WBC: 4.6 10*3/uL (ref 4.0–10.5)
nRBC: 0 % (ref 0.0–0.2)

## 2018-06-27 LAB — COMPREHENSIVE METABOLIC PANEL
ALT: 16 U/L (ref 0–44)
AST: 12 U/L — ABNORMAL LOW (ref 15–41)
Albumin: 3.8 g/dL (ref 3.5–5.0)
Alkaline Phosphatase: 101 U/L (ref 38–126)
Anion gap: 7 (ref 5–15)
BUN: 31 mg/dL — ABNORMAL HIGH (ref 8–23)
CO2: 24 mmol/L (ref 22–32)
Calcium: 9.2 mg/dL (ref 8.9–10.3)
Chloride: 107 mmol/L (ref 98–111)
Creatinine, Ser: 2.12 mg/dL — ABNORMAL HIGH (ref 0.44–1.00)
GFR calc Af Amer: 28 mL/min — ABNORMAL LOW (ref >60)
GFR calc non Af Amer: 24 mL/min — ABNORMAL LOW (ref >60)
Glucose, Bld: 102 mg/dL — ABNORMAL HIGH (ref 70–99)
Potassium: 4.9 mmol/L (ref 3.5–5.1)
Sodium: 138 mmol/L (ref 135–145)
Total Bilirubin: 0.4 mg/dL (ref 0.3–1.2)
Total Protein: 7.3 g/dL (ref 6.5–8.1)

## 2018-06-27 LAB — RAPID URINE DRUG SCREEN, HOSP PERFORMED
Amphetamines: NOT DETECTED
Barbiturates: NOT DETECTED
Benzodiazepines: NOT DETECTED
Cocaine: NOT DETECTED
Opiates: NOT DETECTED
Tetrahydrocannabinol: NOT DETECTED

## 2018-06-27 LAB — SURGICAL PCR SCREEN
MRSA, PCR: NEGATIVE
Staphylococcus aureus: POSITIVE — AB

## 2018-06-28 ENCOUNTER — Ambulatory Visit (HOSPITAL_COMMUNITY)
Admission: RE | Admit: 2018-06-28 | Discharge: 2018-06-28 | Disposition: A | Discharge status: Home / Self Care | Payer: Medicare HMO | Source: Ambulatory Visit | Attending: Cardiology | Admitting: Cardiology

## 2018-06-28 DIAGNOSIS — Z01810 Encounter for preprocedural cardiovascular examination: Secondary | ICD-10-CM | POA: Diagnosis not present

## 2018-06-28 MED ORDER — TECHNETIUM TC 99M TETROFOSMIN IV KIT
27.0000 | PACK | Freq: Once | INTRAVENOUS | Status: DC | PRN
  Filled 2018-06-28: qty 27

## 2018-06-28 MED ORDER — REGADENOSON 0.4 MG/5ML IV SOLN: 0.4000 mg | Freq: Once | INTRAVENOUS | Status: DC

## 2018-06-29 ENCOUNTER — Ambulatory Visit (HOSPITAL_COMMUNITY)
Admission: RE | Admit: 2018-06-29 | Payer: Medicare HMO | Source: Ambulatory Visit | Attending: Cardiology | Admitting: Cardiology

## 2018-07-01 ENCOUNTER — Ambulatory Visit: Payer: Self-pay | Admitting: Obstetrics

## 2018-07-02 ENCOUNTER — Other Ambulatory Visit: Payer: Self-pay | Admitting: Family Medicine

## 2018-07-04 ENCOUNTER — Ambulatory Visit: Payer: Self-pay | Admitting: Cardiology

## 2018-07-04 NOTE — Telephone Encounter (Signed)
Requested medication (s) are due for refill today: yes  Requested medication (s) are on the active medication list: yes  Last refill:  0918/19  Future visit scheduled: no  Notes to clinic:  Cardiovascular: Nitrates failed  Requested Prescriptions  Pending Prescriptions Disp Refills   isosorbide mononitrate (IMDUR) 60 MG 24 hr tablet [Pharmacy Med Name: ISOSORBIDE MONONITRATE 60MG  ER TABS] 90 tablet 0    Sig: TAKE 1 TABLET BY MOUTH DAILY     Cardiovascular:  Nitrates Failed - 07/02/2018  1:56 PM      Failed - Last BP in normal range    BP Readings from Last 1 Encounters:  06/27/18 (!) 141/54         Passed - Last Heart Rate in normal range    Pulse Readings from Last 1 Encounters:  06/27/18 (!) 4         Passed - Valid encounter within last 12 months    Recent Outpatient Visits          1 month ago Chronic diastolic congestive heart failure ([place])   [facility] at [facility], Zoe A, MD   4 months ago Left-sided weakness   Primary Care at [place], MD   5 months ago Essential hypertension   Primary Care at [facility] [place] A, MD   6 months ago Acute pain of left shoulder   Primary Care at [facility], Arlie Solomons, MD   7 months ago Essential hypertension   Primary Care at [facility], Arlie Solomons, MD

## 2018-07-08 ENCOUNTER — Ambulatory Visit (HOSPITAL_COMMUNITY)
Admission: RE | Admit: 2018-07-08 | Discharge: 2018-07-08 | Disposition: A | Discharge status: Home / Self Care | Payer: Medicare HMO | Source: Ambulatory Visit | Attending: Cardiovascular Disease | Admitting: Cardiovascular Disease

## 2018-07-08 LAB — MYOCARDIAL PERFUSION IMAGING
LV dias vol: 127 mL (ref 46–106)
LV sys vol: 61 mL
Peak HR: 67 {beats}/min
Rest HR: 49 {beats}/min
SDS: 11
SRS: 4
SSS: 12
TID: 1.2

## 2018-07-08 MED ORDER — TECHNETIUM TC 99M TETROFOSMIN IV KIT
27.0000 | PACK | Freq: Once | INTRAVENOUS | Status: AC | PRN
  Administered 2018-07-08: 27 via INTRAVENOUS

## 2018-07-11 ENCOUNTER — Telehealth: Payer: Self-pay | Admitting: *Deleted

## 2018-07-11 NOTE — Anesthesia Preprocedure Evaluation (Addendum)
Anesthesia Evaluation  Patient identified by MRN, date of birth, ID band Patient awake    Reviewed: Allergy & Precautions, NPO status , Patient's Chart, lab work & pertinent test results, reviewed documented beta blocker date and time   Airway Mallampati: II  TM Distance: >3 FB Neck ROM: Full    Dental  (+) Dental Advisory Given, Edentulous Upper, Edentulous Lower   Pulmonary Current Smoker,    Pulmonary exam normal breath sounds clear to auscultation       Cardiovascular hypertension, Pt. on home beta blockers and Pt. on medications +CHF and + DOE  Normal cardiovascular exam Rhythm:Regular Rate:Normal  Stress test done on 07/08/18 with no ischemia noted, EF 52%, low risk study.    Neuro/Psych PSYCHIATRIC DISORDERS Anxiety Depression Bipolar Disorder Tardive dyskinesia    GI/Hepatic GERD  Medicated,(+)     substance abuse (Opioid dependence)  , Hepatitis - (Hep C (treated with Harvoni))  Endo/Other  Morbid obesity  Renal/GU Renal InsufficiencyRenal disease     Musculoskeletal negative musculoskeletal ROS (+)   Abdominal   Peds  Hematology  (+) Blood dyscrasia, anemia ,   Anesthesia Other Findings Day of surgery medications reviewed with the patient.  Reproductive/Obstetrics                           Anesthesia Physical Anesthesia Plan  ASA: III  Anesthesia Plan: General   Post-op Pain Management:    Induction: Intravenous  PONV Risk Score and Plan: 3 and Midazolam, Dexamethasone and Ondansetron  Airway Management Planned: Oral ETT  Additional Equipment:   Intra-op Plan:   Post-operative Plan: Extubation in OR  Informed Consent: I have reviewed the patients History and Physical, chart, labs and discussed the procedure including the risks, benefits and alternatives for the proposed anesthesia with the patient or authorized representative who has indicated his/her understanding  and acceptance.   Dental advisory given  Plan Discussed with: CRNA  Anesthesia Plan Comments:        Anesthesia Quick Evaluation

## 2018-07-11 NOTE — Telephone Encounter (Signed)
Returned patient's call.  Patient called wanting to know if she  had any other appointments before surgery on 07/12/18.  I check ed with Joylene John, NP, and no other appointments at this time.  Patient's states she will be here at 0530 in the morning for her surgery. Patient verbalized understanding, and no further needs at this time.

## 2018-07-11 NOTE — Progress Notes (Signed)
Anesthesia Chart Review   Case:  [id number] Date/Time:  07/12/18 0700   Procedure:  XI ROBOTIC ASSISTED UNILATERAL SALPINGO OOPHORECTOMY, POSSIBLE BILATERAL, POSSIBLE STAGING (N/A )   Anesthesia type:  General   Pre-op diagnosis:  OVARIAN CYST   Location:  [facility] ROOM 03 / [facility]   Surgeon:  Isabel Caprice, MD      DISCUSSION:  62 yo current every day smoker with w/o HTN, Hep C (treated with Harvoni), CHF, CKD, Bipolar, anxiety, tardive dyskinesia as a side effect of prior antipsychotic, Opioid dependence (has been treated in [facility] rehab in the past) who is scheduled for above surgery on 07/12/18 with Dr. Precious Haws.   From 04/12/18 to 10/15 19 she was admitted at [facility] after she presented to their ER with severe epigastric pain and nausea/emesis and was taken urgently to the OR for an incarcerated ventral hernia. She was noted to a hypertensive emergency (BP 226/83 & 214/89) and then bradycardia while admitted and had to be followed by medicine during her 2 week hospital stay.  She had hyperkalemia due to "stage IV" CKD.  She went to surgery 04/13/18 where a LSC LOA and placment of a 15x20 mesh was performed.  She has followed with PCP since this admission with improved HTN with medication titration. Last seen by PCP, Dr. Delia Chimes on 05/13/18.  Creatinine is being followed by PCP, referred to nephrology in the past, pending. At PST visit Creatinine 2.12, appears to be around 2 over the last year.   Pt seen by Cardiologist on 06/13/18, Dr. Jyl Heinz.  Per Dr. Julien Nordmann note, " In view of multiple risk factors for coronary artery disease she will undergo Lexiscan sestamibi.  If this is negative she is not at high risk for coronary events during the aforementioned procedure.  Meticulous hemodynamic monitoring and continued perioperative beta-blockade will further reduce the risk of coronary event. Echocardiogram will be done to assess murmur heard on auscultation."   Stress test done on  07/08/18 with no ischemia noted, EF 52%, low risk study.   Pt can proceed with planned procedure barring acute status change.  VS: BP (!) 141/54   Pulse (!) 58   Temp 36.9 C (Oral)   Resp 20   Ht 5\' 2"  (1.575 m)   Wt 110.8 kg   SpO2 100%   BMI 44.67 kg/m   PROVIDERS: Forrest Moron, MD is PCP  Jyl Heinz, MD is Cardiologist   LABS: Labs reviewed: Acceptable for surgery. (all labs ordered are listed, but only abnormal results are displayed)  Labs Reviewed  SURGICAL PCR SCREEN - Abnormal; Notable for the following components:      Result Value   Staphylococcus aureus POSITIVE (*)    All other components within normal limits  CBC - Abnormal; Notable for the following components:   RBC 3.70 (*)    Hemoglobin 10.3 (*)    HCT 34.4 (*)    MCHC 29.9 (*)    All other components within normal limits  COMPREHENSIVE METABOLIC PANEL - Abnormal; Notable for the following components:   Glucose, Bld 102 (*)    BUN 31 (*)    Creatinine, Ser 2.12 (*)    AST 12 (*)    GFR calc non Af Amer 24 (*)    GFR calc Af Amer 28 (*)    All other components within normal limits  URINALYSIS, ROUTINE W REFLEX MICROSCOPIC - Abnormal; Notable for the following components:   Protein, ur 100 (*)  Bacteria, UA RARE (*)    All other components within normal limits  RAPID URINE DRUG SCREEN, HOSP PERFORMED    IMAGES:  Chest Xray 10/21/17 FINDINGS: There is no edema or consolidation. Heart is borderline enlarged with pulmonary vascularity within normal limits. No adenopathy. There is postoperative change in the lower cervical region.  IMPRESSION: Borderline cardiac enlargement.  No edema or consolidation.  EKG 06/13/18:  Rate 60bpm Normal Sinus rhythm Possible Left atrial enlargement Left ventricular hypertrophy Abnormal ECG  CV: Echo 06/21/18 Study Conclusions  - Left ventricle: The cavity size was normal. Wall thickness was   increased in a pattern of moderate LVH. Systolic  function was   normal. The estimated ejection fraction was in the range of 60%   to 65%. Wall motion was normal; there were no regional wall   motion abnormalities. Doppler parameters are consistent with   abnormal left ventricular relaxation (grade 1 diastolic   dysfunction). - Ascending aorta: The ascending aorta was mildly dilated. - Mitral valve: Mildly calcified annulus. There was mild   regurgitation. - Left atrium: The atrium was mildly to moderately dilated. - Pulmonary arteries: PA peak pressure: 33 mm Hg (S). - Pericardium, extracardiac: A small pericardial effusion was   identified posterior to the heart.  Stress Test 07/08/18   Nuclear stress EF: 52%.  The left ventricular ejection fraction is mildly decreased (45-54%).  There was no ST segment deviation noted during stress.  The study is normal.  This is a low risk study.  Breast attenuation artifact noted.  Significant bradycardia with the infusion of Lexiscan.  Past Medical History:  Diagnosis Date  . Anxiety   . Bipolar and related disorder ([place])   . Breast cancer ([place])   . CHF (congestive heart failure) ([place])   . Chronic back pain   . Chronic kidney disease    "Stage IV" - states due to hypertension  . Hepatitis C   . Hypertension   . Left-sided weakness   . MRSA infection    of breast incision  . Opioid dependence ([place])    Has been treated in [facility] in past    Past Surgical History:  Procedure Laterality Date  . BACK SURGERY  1990  . BREAST SURGERY     "breast cancer survivor" - states partial mastectomy and nodes  . LAPAROSCOPIC CHOLECYSTECTOMY  2002  . LAPAROSCOPIC INCISIONAL / UMBILICAL / VENTRAL HERNIA REPAIR  04/13/2018   with BARD 15x 03TD mesh (supraumbilical)  . MYOMECTOMY     x 2 prior to hysterectomy  . TOTAL ABDOMINAL HYSTERECTOMY     fibroids     MEDICATIONS: . amLODipine (NORVASC) 10 MG tablet  . BELSOMRA 10 MG TABS  . cloNIDine (CATAPRES) 0.1 MG tablet  .  ferrous sulfate 325 (65 FE) MG tablet  . FLUoxetine (PROZAC) 20 MG tablet  . gabapentin (NEURONTIN) 600 MG tablet  . hydrALAZINE (APRESOLINE) 100 MG tablet  . INGREZZA 80 MG CAPS  . isosorbide mononitrate (IMDUR) 60 MG 24 hr tablet  . labetalol (NORMODYNE) 100 MG tablet  . Multiple Vitamins-Minerals (ADULT GUMMY) CHEW  . ondansetron (ZOFRAN) 4 MG tablet  . oxybutynin (DITROPAN-XL) 5 MG 24 hr tablet  . pantoprazole (PROTONIX) 20 MG tablet  . terazosin (HYTRIN) 2 MG capsule   No current facility-administered medications for this encounter.     Maia Plan [facility] Pre-Surgical Testing [phone] 07/11/18 10:39 AM

## 2018-07-12 ENCOUNTER — Encounter (HOSPITAL_COMMUNITY): Payer: Self-pay | Admitting: Emergency Medicine

## 2018-07-12 ENCOUNTER — Ambulatory Visit (HOSPITAL_COMMUNITY): Payer: Medicare HMO | Admitting: Physician Assistant

## 2018-07-12 ENCOUNTER — Encounter (HOSPITAL_COMMUNITY)
Admission: RE | Disposition: A | Discharge status: Home / Self Care | Payer: Self-pay | Source: Home / Self Care | Attending: Obstetrics

## 2018-07-12 ENCOUNTER — Ambulatory Visit (HOSPITAL_COMMUNITY)
Admission: RE | Admit: 2018-07-12 | Discharge: 2018-07-12 | Disposition: A | Discharge status: Home / Self Care | Payer: Medicare HMO | Attending: Obstetrics | Admitting: Obstetrics

## 2018-07-12 DIAGNOSIS — Z880 Allergy status to penicillin: Secondary | ICD-10-CM | POA: Insufficient documentation

## 2018-07-12 DIAGNOSIS — I509 Heart failure, unspecified: Secondary | ICD-10-CM | POA: Diagnosis not present

## 2018-07-12 DIAGNOSIS — Z853 Personal history of malignant neoplasm of breast: Secondary | ICD-10-CM | POA: Diagnosis not present

## 2018-07-12 DIAGNOSIS — Z888 Allergy status to other drugs, medicaments and biological substances status: Secondary | ICD-10-CM | POA: Diagnosis not present

## 2018-07-12 DIAGNOSIS — N184 Chronic kidney disease, stage 4 (severe): Secondary | ICD-10-CM | POA: Insufficient documentation

## 2018-07-12 DIAGNOSIS — R1900 Intra-abdominal and pelvic swelling, mass and lump, unspecified site: Secondary | ICD-10-CM | POA: Diagnosis present

## 2018-07-12 DIAGNOSIS — Z6841 Body Mass Index (BMI) 40.0 and over, adult: Secondary | ICD-10-CM | POA: Insufficient documentation

## 2018-07-12 DIAGNOSIS — F17210 Nicotine dependence, cigarettes, uncomplicated: Secondary | ICD-10-CM | POA: Diagnosis not present

## 2018-07-12 DIAGNOSIS — N83201 Unspecified ovarian cyst, right side: Secondary | ICD-10-CM | POA: Diagnosis not present

## 2018-07-12 DIAGNOSIS — Z79899 Other long term (current) drug therapy: Secondary | ICD-10-CM | POA: Diagnosis not present

## 2018-07-12 DIAGNOSIS — F419 Anxiety disorder, unspecified: Secondary | ICD-10-CM | POA: Diagnosis not present

## 2018-07-12 DIAGNOSIS — N83209 Unspecified ovarian cyst, unspecified side: Secondary | ICD-10-CM

## 2018-07-12 DIAGNOSIS — K219 Gastro-esophageal reflux disease without esophagitis: Secondary | ICD-10-CM | POA: Diagnosis not present

## 2018-07-12 DIAGNOSIS — I130 Hypertensive heart and chronic kidney disease with heart failure and stage 1 through stage 4 chronic kidney disease, or unspecified chronic kidney disease: Secondary | ICD-10-CM | POA: Insufficient documentation

## 2018-07-12 DIAGNOSIS — D270 Benign neoplasm of right ovary: Secondary | ICD-10-CM

## 2018-07-12 HISTORY — PX: LAPAROSCOPIC LYSIS OF ADHESIONS: SHX5905

## 2018-07-12 HISTORY — PX: ROBOTIC ASSISTED BILATERAL SALPINGO OOPHERECTOMY: SHX6078

## 2018-07-12 LAB — TYPE AND SCREEN
ABO/RH(D): O POS
Antibody Screen: NEGATIVE

## 2018-07-12 SURGERY — SALPINGO-OOPHORECTOMY, BILATERAL, ROBOT-ASSISTED
Anesthesia: General | Laterality: Right

## 2018-07-12 MED ORDER — ROCURONIUM BROMIDE 10 MG/ML (PF) SYRINGE
PREFILLED_SYRINGE | INTRAVENOUS | Status: DC | PRN
  Administered 2018-07-12: 70 mg via INTRAVENOUS
  Administered 2018-07-12: 30 mg via INTRAVENOUS
  Administered 2018-07-12: 10 mg via INTRAVENOUS

## 2018-07-12 MED ORDER — LIDOCAINE 2% (20 MG/ML) 5 ML SYRINGE
INTRAMUSCULAR | Status: DC | PRN
  Administered 2018-07-12: 1.5 mg/kg/h via INTRAVENOUS

## 2018-07-12 MED ORDER — OXYCODONE HCL 5 MG PO TABS
5.0000 mg | ORAL_TABLET | ORAL | Status: DC | PRN
  Administered 2018-07-12: 5 mg via ORAL

## 2018-07-12 MED ORDER — ONDANSETRON HCL 4 MG/2ML IJ SOLN
INTRAMUSCULAR | Status: AC
  Filled 2018-07-12: qty 2

## 2018-07-12 MED ORDER — DEXAMETHASONE SODIUM PHOSPHATE 4 MG/ML IJ SOLN
4.0000 mg | INTRAMUSCULAR | Status: AC
  Administered 2018-07-12: 5 mg via INTRAVENOUS

## 2018-07-12 MED ORDER — EPHEDRINE 5 MG/ML INJ
INTRAVENOUS | Status: AC
  Filled 2018-07-12: qty 10

## 2018-07-12 MED ORDER — ONDANSETRON HCL 4 MG/2ML IJ SOLN
INTRAMUSCULAR | Status: DC | PRN
  Administered 2018-07-12: 4 mg via INTRAVENOUS

## 2018-07-12 MED ORDER — SUGAMMADEX SODIUM 200 MG/2ML IV SOLN
INTRAVENOUS | Status: AC
  Filled 2018-07-12: qty 2

## 2018-07-12 MED ORDER — EPHEDRINE SULFATE-NACL 50-0.9 MG/10ML-% IV SOSY
PREFILLED_SYRINGE | INTRAVENOUS | Status: DC | PRN
  Administered 2018-07-12 (×3): 7.5 mg via INTRAVENOUS

## 2018-07-12 MED ORDER — ACETAMINOPHEN 500 MG PO TABS
1000.0000 mg | ORAL_TABLET | ORAL | Status: AC
  Administered 2018-07-12: 1000 mg via ORAL
  Filled 2018-07-12: qty 2

## 2018-07-12 MED ORDER — MORPHINE SULFATE (PF) 4 MG/ML IV SOLN: 2.0000 mg | INTRAVENOUS | Status: DC | PRN

## 2018-07-12 MED ORDER — CEFAZOLIN SODIUM-DEXTROSE 2-4 GM/100ML-% IV SOLN
2.0000 g | INTRAVENOUS | Status: AC
  Administered 2018-07-12: 2 g via INTRAVENOUS
  Filled 2018-07-12: qty 100

## 2018-07-12 MED ORDER — ONDANSETRON HCL 4 MG/2ML IJ SOLN: 4.0000 mg | Freq: Once | INTRAMUSCULAR | Status: DC | PRN

## 2018-07-12 MED ORDER — ACETAMINOPHEN 325 MG PO TABS: 650.0000 mg | ORAL_TABLET | ORAL | Status: DC | PRN

## 2018-07-12 MED ORDER — ROCURONIUM BROMIDE 10 MG/ML (PF) SYRINGE
PREFILLED_SYRINGE | INTRAVENOUS | Status: AC
  Filled 2018-07-12: qty 10

## 2018-07-12 MED ORDER — MIDAZOLAM HCL 5 MG/5ML IJ SOLN
INTRAMUSCULAR | Status: DC | PRN
  Administered 2018-07-12: 2 mg via INTRAVENOUS

## 2018-07-12 MED ORDER — SODIUM CHLORIDE 0.9% FLUSH: 3.0000 mL | Freq: Two times a day (BID) | INTRAVENOUS | Status: DC

## 2018-07-12 MED ORDER — KETAMINE HCL 10 MG/ML IJ SOLN
INTRAMUSCULAR | Status: DC | PRN
  Administered 2018-07-12: 25 mg via INTRAVENOUS

## 2018-07-12 MED ORDER — LIDOCAINE 2% (20 MG/ML) 5 ML SYRINGE
INTRAMUSCULAR | Status: AC
  Filled 2018-07-12: qty 5

## 2018-07-12 MED ORDER — FENTANYL CITRATE (PF) 100 MCG/2ML IJ SOLN
INTRAMUSCULAR | Status: AC
  Filled 2018-07-12: qty 4

## 2018-07-12 MED ORDER — MIDAZOLAM HCL 2 MG/2ML IJ SOLN
INTRAMUSCULAR | Status: AC
  Filled 2018-07-12: qty 2

## 2018-07-12 MED ORDER — STERILE WATER FOR IRRIGATION IR SOLN
Status: DC | PRN
  Administered 2018-07-12: 1000 mL

## 2018-07-12 MED ORDER — ACETAMINOPHEN 650 MG RE SUPP
650.0000 mg | RECTAL | Status: DC | PRN
  Filled 2018-07-12: qty 1

## 2018-07-12 MED ORDER — KETAMINE HCL 10 MG/ML IJ SOLN
INTRAMUSCULAR | Status: AC
  Filled 2018-07-12: qty 1

## 2018-07-12 MED ORDER — FENTANYL CITRATE (PF) 100 MCG/2ML IJ SOLN
25.0000 ug | INTRAMUSCULAR | Status: DC | PRN
  Administered 2018-07-12 (×2): 50 ug via INTRAVENOUS

## 2018-07-12 MED ORDER — OXYCODONE HCL 5 MG PO TABS
ORAL_TABLET | ORAL | Status: AC
  Filled 2018-07-12: qty 1

## 2018-07-12 MED ORDER — PROPOFOL 10 MG/ML IV BOLUS
INTRAVENOUS | Status: AC
  Filled 2018-07-12: qty 20

## 2018-07-12 MED ORDER — PHENYLEPHRINE 40 MCG/ML (10ML) SYRINGE FOR IV PUSH (FOR BLOOD PRESSURE SUPPORT)
PREFILLED_SYRINGE | INTRAVENOUS | Status: AC
  Filled 2018-07-12: qty 10

## 2018-07-12 MED ORDER — GLYCOPYRROLATE PF 0.2 MG/ML IJ SOSY
PREFILLED_SYRINGE | INTRAMUSCULAR | Status: AC
  Filled 2018-07-12: qty 1

## 2018-07-12 MED ORDER — PHENYLEPHRINE 40 MCG/ML (10ML) SYRINGE FOR IV PUSH (FOR BLOOD PRESSURE SUPPORT)
PREFILLED_SYRINGE | INTRAVENOUS | Status: DC | PRN
  Administered 2018-07-12: 80 ug via INTRAVENOUS

## 2018-07-12 MED ORDER — LIDOCAINE HCL 2 % IJ SOLN
INTRAMUSCULAR | Status: AC
  Filled 2018-07-12: qty 20

## 2018-07-12 MED ORDER — FENTANYL CITRATE (PF) 250 MCG/5ML IJ SOLN
INTRAMUSCULAR | Status: AC
  Filled 2018-07-12: qty 5

## 2018-07-12 MED ORDER — SODIUM CHLORIDE 0.9 % IV SOLN: 250.0000 mL | INTRAVENOUS | Status: DC | PRN

## 2018-07-12 MED ORDER — GABAPENTIN 300 MG PO CAPS
300.0000 mg | ORAL_CAPSULE | ORAL | Status: DC
  Filled 2018-07-12: qty 1

## 2018-07-12 MED ORDER — SUGAMMADEX SODIUM 500 MG/5ML IV SOLN
INTRAVENOUS | Status: AC
  Filled 2018-07-12: qty 5

## 2018-07-12 MED ORDER — LIDOCAINE 2% (20 MG/ML) 5 ML SYRINGE
INTRAMUSCULAR | Status: DC | PRN
  Administered 2018-07-12: 60 mg via INTRAVENOUS

## 2018-07-12 MED ORDER — PROPOFOL 10 MG/ML IV BOLUS
INTRAVENOUS | Status: DC | PRN
  Administered 2018-07-12: 140 mg via INTRAVENOUS

## 2018-07-12 MED ORDER — FENTANYL CITRATE (PF) 100 MCG/2ML IJ SOLN
INTRAMUSCULAR | Status: DC | PRN
  Administered 2018-07-12: 100 ug via INTRAVENOUS
  Administered 2018-07-12 (×2): 50 ug via INTRAVENOUS

## 2018-07-12 MED ORDER — OXYCODONE-ACETAMINOPHEN 5-325 MG PO TABS: 1.0000 | ORAL_TABLET | Freq: Four times a day (QID) | ORAL | 0 refills | Status: DC | PRN

## 2018-07-12 MED ORDER — SODIUM CHLORIDE 0.9 % IR SOLN
Status: DC | PRN
  Administered 2018-07-12: 1000 mL

## 2018-07-12 MED ORDER — LACTATED RINGERS IV SOLN
INTRAVENOUS | Status: DC
  Administered 2018-07-12 (×2): via INTRAVENOUS

## 2018-07-12 MED ORDER — DEXAMETHASONE SODIUM PHOSPHATE 10 MG/ML IJ SOLN
INTRAMUSCULAR | Status: AC
  Filled 2018-07-12: qty 1

## 2018-07-12 MED ORDER — SCOPOLAMINE 1 MG/3DAYS TD PT72
1.0000 | MEDICATED_PATCH | TRANSDERMAL | Status: DC
  Administered 2018-07-12: 1.5 mg via TRANSDERMAL
  Filled 2018-07-12: qty 1

## 2018-07-12 MED ORDER — BUPIVACAINE-EPINEPHRINE (PF) 0.25% -1:200000 IJ SOLN
INTRAMUSCULAR | Status: AC
  Filled 2018-07-12: qty 30

## 2018-07-12 MED ORDER — SODIUM CHLORIDE 0.9% FLUSH: 3.0000 mL | INTRAVENOUS | Status: DC | PRN

## 2018-07-12 MED ORDER — BUPIVACAINE-EPINEPHRINE (PF) 0.25% -1:200000 IJ SOLN
INTRAMUSCULAR | Status: DC | PRN
  Administered 2018-07-12: 30 mL via PERINEURAL

## 2018-07-12 MED ORDER — SUGAMMADEX SODIUM 200 MG/2ML IV SOLN
INTRAVENOUS | Status: DC | PRN
  Administered 2018-07-12: 400 mg via INTRAVENOUS

## 2018-07-12 SURGICAL SUPPLY — 79 items
ADH SKN CLS APL DERMABOND .7 (GAUZE/BANDAGES/DRESSINGS) ×2
AGENT HMST KT MTR STRL THRMB (HEMOSTASIS)
APL ESCP 34 STRL LF DISP (HEMOSTASIS)
APL SWBSTK 6 STRL LF DISP (MISCELLANEOUS) ×2
APPLICATOR COTTON TIP 6 STRL (MISCELLANEOUS) ×2 IMPLANT
APPLICATOR COTTON TIP 6IN STRL (MISCELLANEOUS) ×4
APPLICATOR SURGIFLO ENDO (HEMOSTASIS) IMPLANT
BAG LAPAROSCOPIC 12 15 PORT 16 (BASKET) IMPLANT
BAG RETRIEVAL 12/15 (BASKET) ×3
BAG RETRIEVAL 12/15MM (BASKET) ×1
BAG SPEC RTRVL LRG 6X4 10 (ENDOMECHANICALS) ×2
COVER BACK TABLE 60X90IN (DRAPES) ×4 IMPLANT
COVER TIP SHEARS 8 DVNC (MISCELLANEOUS) ×2 IMPLANT
COVER TIP SHEARS 8MM DA VINCI (MISCELLANEOUS) ×2
COVER WAND RF STERILE (DRAPES) ×2 IMPLANT
DERMABOND ADVANCED (GAUZE/BANDAGES/DRESSINGS) ×2
DERMABOND ADVANCED .7 DNX12 (GAUZE/BANDAGES/DRESSINGS) ×2 IMPLANT
DRAPE ARM DVNC X/XI (DISPOSABLE) ×8 IMPLANT
DRAPE COLUMN DVNC XI (DISPOSABLE) ×2 IMPLANT
DRAPE DA VINCI XI ARM (DISPOSABLE) ×8
DRAPE DA VINCI XI COLUMN (DISPOSABLE) ×2
DRAPE SHEET LG 3/4 BI-LAMINATE (DRAPES) ×4 IMPLANT
DRAPE SURG IRRIG POUCH 19X23 (DRAPES) ×4 IMPLANT
DRAPE UNDERBUTTOCKS STRL (DRAPE) ×4 IMPLANT
DRSG TEGADERM 8X12 (GAUZE/BANDAGES/DRESSINGS) ×4 IMPLANT
ELECT REM PT RETURN 15FT ADLT (MISCELLANEOUS) ×4 IMPLANT
GLOVE BIO SURGEON STRL SZ 6 (GLOVE) IMPLANT
GLOVE BIO SURGEON STRL SZ 6.5 (GLOVE) ×1 IMPLANT
GLOVE BIO SURGEONS STRL SZ 6.5 (GLOVE) ×1
GLOVE BIOGEL PI IND STRL 7.0 (GLOVE) ×6 IMPLANT
GLOVE BIOGEL PI INDICATOR 7.0 (GLOVE) ×6
GLOVE SURG SS PI 6.5 STRL IVOR (GLOVE) ×12 IMPLANT
GOWN STRL REUS W/ TWL LRG LVL3 (GOWN DISPOSABLE) ×2 IMPLANT
GOWN STRL REUS W/TWL LRG LVL3 (GOWN DISPOSABLE) ×4
GOWN STRL REUS W/TWL XL LVL3 (GOWN DISPOSABLE) ×8 IMPLANT
GYRUS RUMI II 2.5CM BLUE (DISPOSABLE)
GYRUS RUMI II 3.5CM BLUE (DISPOSABLE)
HOLDER FOLEY CATH W/STRAP (MISCELLANEOUS) ×4 IMPLANT
IRRIG SUCT STRYKERFLOW 2 WTIP (MISCELLANEOUS) ×4
IRRIGATION SUCT STRKRFLW 2 WTP (MISCELLANEOUS) ×2 IMPLANT
KIT PROCEDURE DA VINCI SI (MISCELLANEOUS)
KIT PROCEDURE DVNC SI (MISCELLANEOUS) IMPLANT
MANIPULATOR UTERINE 4.5 ZUMI (MISCELLANEOUS) IMPLANT
NDL HYPO 25X1 1.5 SAFETY (NEEDLE) ×2 IMPLANT
NDL SPNL 18GX3.5 QUINCKE PK (NEEDLE) IMPLANT
NEEDLE HYPO 25X1 1.5 SAFETY (NEEDLE) ×4 IMPLANT
NEEDLE SPNL 18GX3.5 QUINCKE PK (NEEDLE) IMPLANT
OBTURATOR OPTICAL STANDARD 8MM (TROCAR) ×2
OBTURATOR OPTICAL STND 8 DVNC (TROCAR) ×2
OBTURATOR OPTICALSTD 8 DVNC (TROCAR) ×2 IMPLANT
PACK ROBOT GYN CUSTOM WL (TRAY / TRAY PROCEDURE) ×4 IMPLANT
PAD POSITIONING PINK XL (MISCELLANEOUS) ×4 IMPLANT
PORT ACCESS TROCAR AIRSEAL 12 (TROCAR) ×2 IMPLANT
PORT ACCESS TROCAR AIRSEAL 5M (TROCAR) ×2
POUCH SPECIMEN RETRIEVAL 10MM (ENDOMECHANICALS) ×2 IMPLANT
RUMI II 3.0CM BLUE KOH-EFFICIE (DISPOSABLE) IMPLANT
RUMI II GYRUS 2.5CM BLUE (DISPOSABLE) IMPLANT
RUMI II GYRUS 3.5CM BLUE (DISPOSABLE) IMPLANT
SCISSORS LAP 5X45 EPIX DISP (ENDOMECHANICALS) ×2 IMPLANT
SEAL CANN UNIV 5-8 DVNC XI (MISCELLANEOUS) ×8 IMPLANT
SEAL XI 5MM-8MM UNIVERSAL (MISCELLANEOUS) ×8
SET TRI-LUMEN FLTR TB AIRSEAL (TUBING) ×4 IMPLANT
SURGIFLO W/THROMBIN 8M KIT (HEMOSTASIS) IMPLANT
SUT VIC AB 0 CT1 27 (SUTURE)
SUT VIC AB 0 CT1 27XBRD ANTBC (SUTURE) IMPLANT
SUT VIC AB 4-0 PS2 27 (SUTURE) ×8 IMPLANT
SUT VLOC 180 0 9IN  GS21 (SUTURE) ×2
SUT VLOC 180 0 9IN GS21 (SUTURE) ×2 IMPLANT
SYR 10ML LL (SYRINGE) IMPLANT
TIP RUMI ORANGE 6.7MMX12CM (TIP) IMPLANT
TIP UTERINE 5.1X6CM LAV DISP (MISCELLANEOUS) IMPLANT
TIP UTERINE 6.7X10CM GRN DISP (MISCELLANEOUS) IMPLANT
TIP UTERINE 6.7X6CM WHT DISP (MISCELLANEOUS) IMPLANT
TIP UTERINE 6.7X8CM BLUE DISP (MISCELLANEOUS) IMPLANT
TOWEL OR NON WOVEN STRL DISP B (DISPOSABLE) ×4 IMPLANT
TRAP SPECIMEN MUCOUS 40CC (MISCELLANEOUS) ×2 IMPLANT
TRAY FOLEY MTR SLVR 16FR STAT (SET/KITS/TRAYS/PACK) ×4 IMPLANT
UNDERPAD 30X30 (UNDERPADS AND DIAPERS) ×4 IMPLANT
WATER STERILE IRR 1000ML POUR (IV SOLUTION) ×4 IMPLANT

## 2018-07-12 NOTE — Op Note (Signed)
Sherry [name]  1956/05/28 31December 2019    PCP:  Forrest Moron, MD   Surgeon: Kaylyn Lim, MD, FACS  Asst:  Precious Haws, MD  Anes:  general  Preop Dx: Recent abdominal mesh placement and planned robotic oopherectomy Postop Dx: same  Procedure: Laparoscopic access and takedown of adhesions to the mesh Location Surgery: [facility] 3 Complications: None noted  EBL:   minimal cc  Drains: none  Description of Procedure:  The patient was taken to OR 3 .  After anesthesia was administered and the patient was prepped and a timeout was performed.  I was requested by Dr. Gerarda Fraction to assist with the laparoscopic approach through a recent ventral hernia repair by Dr. Raul Del in [facility] done in October.    I placed a 5 mm Optiview through the right upper quadrant without difficulty.  Following insufflation, the omentum was noted to be adherent to the large piece of mesh and to the titanium tacks that secured it.  Fortunately, the omentum was teased off the mesh without significant bleeding and this was taken above the umbilicus and allowed Dr. Gerarda Fraction to place her robotic trocars.  The right upper quadrant trocar was changed up to a 12 for the Air Seal.  Some other adhesions were taken down by me in the right lower quadrant to expose a large cystic mass which she was about to take out robotically.  I scrubbed out and she and Dr. Delsa Sale were to complete the procedure.         Matt B. Hassell Done, [place], [facility], [place]

## 2018-07-12 NOTE — Anesthesia Postprocedure Evaluation (Signed)
Anesthesia Post Note  Patient: Sherry [name]  Procedure(s) Performed: XI ROBOTIC ASSISTED RIGHT SALPINGO OOPHORECTOMY (Right ) LAPAROSCOPIC LYSIS OF ADHESIONS     Patient location during evaluation: PACU Anesthesia Type: General Level of consciousness: awake and alert Pain management: pain level controlled Vital Signs Assessment: post-procedure vital signs reviewed and stable Respiratory status: spontaneous breathing, nonlabored ventilation, respiratory function stable and patient connected to nasal cannula oxygen Cardiovascular status: blood pressure returned to baseline and stable Postop Assessment: no apparent nausea or vomiting Anesthetic complications: no    Last Vitals:  Vitals:   07/12/18 1115 07/12/18 1130  BP: (!) 143/111 (!) 153/83  Pulse: 67 65  Resp: 11 12  Temp:    SpO2: 100% (!) 89%    Last Pain:  Vitals:   07/12/18 1130  TempSrc:   PainSc: 3                  Catalina Gravel

## 2018-07-12 NOTE — Discharge Instructions (Signed)
Unilateral Salpingo-Oophorectomy, Care After This sheet gives you information about how to care for yourself after your procedure. Your health care provider may also give you more specific instructions. If you have problems or questions, contact your health care provider. What can I expect after the procedure? After the procedure, it is common to have:  Abdominal pain.  Some occasional vaginal bleeding (spotting).  Tiredness. Follow these instructions at home: Incision care   Keep your incision area and your bandage (dressing) clean and dry.  Follow instructions from your health care provider about how to take care of your incision. Make sure you: ? Wash your hands with soap and water before you change your dressing. If soap and water are not available, use hand sanitizer. ? Change your dressing as told by your health care provider. ? Leave stitches (sutures), staples, skin glue, or adhesive strips in place. These skin closures may need to stay in place for 2 weeks or longer. If adhesive strip edges start to loosen and curl up, you may trim the loose edges. Do not remove adhesive strips completely unless your health care provider tells you to do that.  Check your incision area every day for signs of infection. Check for: ? Redness, swelling, or pain. ? Fluid or blood. ? Warmth. ? Pus or a bad smell. Activity  Do not drive or use heavy machinery while taking prescription pain medicine.  Do not drive for 24 hours if you received a medicine to help you relax (sedative).  Take frequent, short walks throughout the day. Rest when you get tired. Ask your health care provider what activities are safe for you.  Avoid activities that require great effort. Also, avoid heavy lifting. Do not lift anything that is heavier than 5 lb (2.3 kg), or the limit that your health care provider tells you, until he or she says that it is safe to do so.  Do not douche, use tampons, or have sex until your  health care provider approves. General instructions  To prevent or treat constipation while you are taking prescription pain medicine, your health care provider may recommend that you: ? Drink enough fluid to keep your urine pale yellow. ? Take over-the-counter or prescription medicines. ? Eat foods that are high in fiber, such as fresh fruits and vegetables, whole grains, and beans. ? Limit foods that are high in fat and processed sugars, such as fried and sweet foods.  Take over-the-counter and prescription medicines only as told by your health care provider.  Do not take baths, swim, or use a hot tub until your health care provider approves. Ask your health care provider if you may take showers. You may only be allowed to take sponge baths.  Wear compression stockings as told by your health care provider. These stockings help to prevent blood clots and reduce swelling in your legs.  Keep all follow-up visits as told by your health care provider. This is important. Contact a health care provider if:  You have pain when you urinate.  You have pus or a bad smelling discharge coming from your vagina.  You have redness, swelling, or pain around your incision.  You have fluid or blood coming from your incision.  Your incision feels warm to the touch.  You have pus or a bad smell coming from your incision.  You have a fever.  Your incision starts to break open.  You have abdominal pain that gets worse or does not get better with medicine.  You develop a rash.  You develop nausea and vomiting.  You feel lightheaded. Get help right away if:  You develop pain in your chest or leg.  You develop shortness of breath.  You faint.  You have increased bleeding from your vagina. Summary  After the procedure, it is common to have pain, tiredness, and occasional bleeding from the vagina.  Follow instructions from your health care provider about how to take care of your  incision.  Check your incision every day for signs of infection and report any symptoms to your health care provider.  Follow instructions from your health care provider about activities and restrictions. This information is not intended to replace advice given to you by your health care provider. Make sure you discuss any questions you have with your health care provider. Document Released: 04/25/2009 Document Revised: 10/08/2016 Document Reviewed: 10/08/2016 Elsevier Interactive Patient Education  2019 [employer].

## 2018-07-12 NOTE — Progress Notes (Addendum)
Belongings placed in locker in PACU per pt request.  Dr. Gerarda Fraction notified that pt does not have a ride home. Her original plan was to go home in taxi by herself.  Pt notified that she is not allowed to leave hospital unattended. Pt left voicemail for sister to come pick up/ride home with her.  No response from sister at this time.   Dr. Gerarda Fraction aware  Pt reports that sister will stay with her for 24 hours after she gets home from surgery.

## 2018-07-12 NOTE — H&P (Signed)
Consult Note: Gyn-Onc  Consult was requested by Dr. Newton Pigg  CC:  No chief complaint on file.   HPI: [name]  is a very nice 62 y.o. G1 P0-0-1-0  . Interval History 12/2017 she had a followup TVUS.   From 04/12/18 to 10/15 19 she was admitted at [facility] after she presented to their ER with severe epigastric abdominal wall pain and nausea/emesis and was taken urgently to the OR for an incarcerated ventral hernia. She was noted to a hypertensive emergency (BP 226/83 & 214/89) and then bradycardia while admitted and had to be followed by medicine during her 2 week hospital stay. She had hyperkalemia due to "stage IV" CKD.  She went to surgery 04/13/18 where a LSC LOA and placment of a 15x20 mesh was performed.   On my review the ovarian cyst has gone from 6.2x5.7x5.7 07/2016 to 6.7 x 6.5 x 6.4cm 04/2018. She claims to have more frequent pain than when I first met her.   We have been waiting to schedule her until she was cleared by cardiology. She is now cleared.  . Presenting History (Oncologic Course if applicable) In January 5366 the patient noted significant right lower quadrant pain accompanied by nausea and vomiting.  She presented to [facility] and was referred on to gastroenterology for further work-up with upper endoscopy and colonoscopy.  She states that the pain has now resolved and she no longer has episodes of nausea or vomiting.  She was referred on to gynecology after CT imaging noted a 6 cm pelvic mass.  Dr. Ulanda Edison with the referring GYN performed transvaginal ultrasound 12/08/2017 in his office and noted that the right ovary had 2 cystic avascular well-defined lesions with the overall adnexa measuring 6.5 x 5.9 x 5.5 cm.  The left ovary could not be definitively visualized.  A Ca1 25 was ordered and was normal at 11.3 on 11/29/2017.  Radiology: . 07/25/2016-CT abdomen and pelvis for what is noted as diarrhea and abdominal pain reveals a 5.7 x 5.6 x 6 cm  complex cystic or solid lesion.  Pelvic ultrasound was recommended. . 07/30/2017-CT abdomen and pelvis-performed for what is stated as acute abdominal and pelvic pain for 2 days-stable 5.8 x 6.2 cm central pelvic cyst ultrasound follow-up in 1 year was recommended.  Small to moderate supraumbilical ventral hernia containing fat. . 08/04/2017 CT abdomen pelvis performed for what is described as clinical abdominal pain in the epigastric region -reveals an interval increase in size of an adnexal mass in the central pelvis now measuring 7 x 6.6 cm in further characterization with an ultrasound is recommended.  There is a stable left adrenal gland adenoma. . 12/08/17 - OSH TVUS 2 cystic lesions with adnexa measures 6.5 x 5.9 x 5.5cm. . 04/07/18 - TVUS - Right ovary Measurements: 6.9 x 6.1 x 6.5 centimeters. Located near the vaginal cuff a round 6.3 centimeter hypoechoic mass with homogeneous internal echoes (image 28) is redemonstrated.Left ovary Measurements: Not identified despite transabdominal and transvaginal imaging. Based on the CTs, there does appear to be left ovarian parenchyma present, although it too is inseparable from dominant pelvic mass which is centrally located. IMPRESSION: 1. Slowly enlarging complex cystic mass within the pelvis which has an ultrasound appearance most compatible with endometrioma. By ultrasound this has increased from about 5.5 (6cm above report though) cm in January 2018 to 6.3 cm now. . 04/12/18 CTAP in PACS - stable 2.2 cm left adrenal adenoma. Probable renal cysts. Left ovary  with homogeneous mass 7.1 x 6.7cm slightly larger than 07/2016. Small umbilical hernia with peritoneal fat. Midline ventral hernia above the umbilicus containing peritoneal fat and a short segment of transverse colon.  Current Meds:  Outpatient Encounter Medications as of 05/20/2018  Medication Sig  . Alcohol Swabs (ALCOHOL PREP) PADS   . amLODipine (NORVASC) 10 MG tablet TAKE 1 TABLET BY MOUTH DAILY  .  Blood Pressure Monitoring (BLOOD PRESSURE MONITOR/WRIST) DEVI   . cloNIDine (CATAPRES) 0.1 MG tablet Take by mouth.  . famotidine (PEPCID) 20 MG tablet Take 20 mg by mouth 2 (two) times daily.  . ferrous sulfate 325 (65 FE) MG tablet Take 1 tablet (325 mg total) by mouth 2 (two) times daily with a meal.  . FLUoxetine (PROZAC) 20 MG tablet Take 20 mg by mouth daily.  Marland Kitchen gabapentin (NEURONTIN) 800 MG tablet Take 1 tablet (800 mg total) by mouth 3 (three) times daily.  . hydrALAZINE (APRESOLINE) 100 MG tablet Take by mouth.  . hydrALAZINE (APRESOLINE) 25 MG tablet Take 1 tablet (25 mg total) by mouth every 8 (eight) hours.  . Incontinence Supply Disposable (BLADDER CONTROL PADS EX ABSORB) MISC   . INGREZZA 80 MG CAPS Take 80 mg by mouth daily.  . isosorbide mononitrate (IMDUR) 60 MG 24 hr tablet TAKE 1 TABLET BY MOUTH DAILY  . labetalol (NORMODYNE) 100 MG tablet Take by mouth.  . labetalol (NORMODYNE) 300 MG tablet TAKE 1 TABLET BY MOUTH TWICE DAILY (Patient taking differently: Take 100 mg by mouth 2 (two) times daily. )  . Multiple Vitamins-Minerals (ADULT GUMMY) CHEW Chew 1 tablet by mouth daily.  Marland Kitchen oxybutynin (DITROPAN-XL) 5 MG 24 hr tablet Take 1 tablet (5 mg total) by mouth at bedtime.  . pantoprazole (PROTONIX) 20 MG tablet Take 1 tablet (20 mg total) by mouth 2 (two) times daily.  . solifenacin (VESICARE) 10 MG tablet Take 1 tablet (10 mg total) by mouth daily.  Marland Kitchen terazosin (HYTRIN) 2 MG capsule Take by mouth.  . ALPRAZolam (XANAX) 1 MG tablet Take 1 tablet (1 mg total) by mouth at bedtime as needed (for MRI). (Patient not taking: Reported on 05/20/2018)   No facility-administered encounter medications on file as of 05/20/2018.     Allergy:  Allergies  Allergen Reactions  . Abilify [Aripiprazole] Other (See Comments)    Tardive dyskinesia Oral  . Remeron [Mirtazapine] Other (See Comments)    Wgt stimulation /gain, Dizziness, Patient says "can tolerate"  . Trazodone And Nefazodone Other  (See Comments)    Nightmares/sleep diturbance  . Flexeril [Cyclobenzaprine] Other (See Comments)    Pt states Flexeril makes her feel depressed   . Amoxicillin Diarrhea and Other (See Comments)    NOTE the patient has had PCN WITHOUT reaction Has patient had a PCN reaction causing immediate rash, facial/tongue/throat swelling, SOB or lightheadedness with hypotension: No Has patient had a PCN reaction causing severe rash involving mucus membranes or skin necrosis: No Has patient had a PCN reaction that required hospitalization: No Has patient had a PCN reaction occurring within the last 10 years: No If all of the above answers are "NO", then may proceed with Cephalosporin use.     Social Hx:  Tobacco use: Over 40 pack years and current smoker Alcohol use: None Illicit Drug use: None currently Illicit IV Drug use: Has used in the past.  Past Surgical Hx:  Past Surgical History:  Procedure Laterality Date  . BACK SURGERY  1990  . BREAST SURGERY     "  breast cancer survivor" - states partial mastectomy and nodes  . LAPAROSCOPIC CHOLECYSTECTOMY  2002  . LAPAROSCOPIC INCISIONAL / UMBILICAL / VENTRAL HERNIA REPAIR  04/13/2018   with BARD 15x 93AT mesh (supraumbilical)  . MYOMECTOMY     x 2 prior to hysterectomy  . TOTAL ABDOMINAL HYSTERECTOMY     fibroids     Past Medical Hx:  Past Medical History:  Diagnosis Date  . Anxiety   . Bipolar and related disorder ([place])   . Breast cancer ([place])   . CHF (congestive heart failure) ([place])   . Chronic back pain   . Chronic kidney disease    "Stage IV" - states due to hypertension  . Hepatitis C   . Hypertension   . Left-sided weakness   . MRSA infection    of breast incision  . Opioid dependence ([place])    Has been treated in [facility] in past    Past Gynecological History:   GYNECOLOGIC HISTORY:  No LMP recorded. Patient has had a hysterectomy. Menarche: 62years old P 0010 LMP in her 57s when she underwent  hysterectomy Contraceptive none HRT none  Last Pap not applicable  Family Hx:  Family History  Problem Relation Age of Onset  . Heart attack Mother   . Breast cancer Mother 33  . Dementia Mother   . Cancer Father 62       died of bleeding from kidneys    Review of Systems:  Review of Systems  Musculoskeletal: Positive for back pain and myalgias.  Neurological: Positive for dizziness.  All other systems reviewed and are negative. has some leg spasms left leg  Vitals:  Blood pressure 135/62, pulse (!) 56, temperature 97.6 F (36.4 C), temperature source Oral, resp. rate 20, height _0  (1.575 m), weight 244 lb (110.7 kg), SpO2 97 %. Body mass index is 44.63 kg/m.   Physical Exam: ECOG PERFORMANCE STATUS: 1 - Symptomatic but completely ambulatory   General :  OBESE Well developed, 62 y.o., female in no apparent distress HEENT:  Normocephalic/atraumatic, symmetric, EOMI, eyelids normal Neck:   Supple, no masses.  Lymphatics:  No cervical/ submandibular/ supraclavicular/ infraclavicular/ inguinal adenopathy Respiratory:  Respirations unlabored, no use of accessory muscles CV:   Deferred Breast:  Deferred Musculoskeletal: No CVA tenderness, normal muscle strength. Abdomen:  Obese, soft, non-tender and nondistended. No evidence of hernia. No masses. Extremities:  No lymphedema, no erythema, non-tender. Skin:   Normal inspection Neuro/Psych:  No focal motor deficit, no abnormal mental status. Normal gait. Normal affect. Alert and oriented to person, place, and time  Genito Urinary: 05/20/2018 Vulva: Normal external female genitalia.  Bladder/urethra: Urethral meatus normal in size and location. No lesions or   masses, well supported bladder Speculum exam: Vagina: No lesion, no discharge, no bleeding. Bimanual exam: Cervix/Uterus: Surgically absent  Adnexa: No masses. Limited exam given habitus Rectovaginal:  Good tone, no masses, no cul de sac nodularity, no parametrial  involvement or nodularity.   ASSESSMENT Ovarian cyst Class 3 Morbid Obesity Stage IV chronic kidney disease H/o CHF  PLAN 1. Complexity of visit o Established problem worsening  o Data reviewed i. Personally reviewed the ultrasound and CT scan including prior scans and compared the size of the lesion in the presence of the patient today ii. Obtained records including operative report and DC summary from 04/2018 admission/hernia repair iii. Reviewed radiology exams. iv. Ordered testing for preop  v. Ordered clearance testing o Risks of complications i. Chronic illness with progression ii.  Major surgery is being arranged and she has significant morbidities including CHF, kidney disease, morbid obesity, prior surgery of myomectomy and hysterectomy, recent surgery 2. Ovarian cyst o Appears homogeneous possibly simple on imaging that I reviewed and has normal Ca125 i. I agree this could possibly be an endometrioma o I offered continued observation versus surgery o The mass is increasing in size and she may be intermittently torsing. In addition she states she has pain as opposed to prior visits she denied. 3. We discussed the risks, benefits, and alternatives of surgery and she wishes to proceed o Surgical sketch was reviewed o She understands given her prior surgery she is at a higher than average risk for bowel and urinary tract injury. 4. Tentative plan is USO (ie mass resection) and if feasible/felt to be reasonably safe then BSO o If malignant on frozen section than BSO and staging as indicated. o If normal contralateral ovary has high risk of injury with removal then tube only removal on that "normal" side offered. 5. If we did proceed to surgery in the future we need to keep her mind that she would need surgical clearance and that she is in rehab for opioid dependence. 6. Also given the recent N/V admission felt to be due to a hernia (found intraop not to have incarceration of  bowel) I wonder if she has been having episodes of torsion 7. PREOP items o Cardiac clearance has been obtained. o PCP deferred opioid on discharge to [place] o General surgery has made themselves available to help with laparoscopic entry o She understands she has a risk of complicated recovery and risk of prolonged surgery with adhesive disease.   Isabel Caprice, MD  07/12/2018, 7:02 AM  Cc: Newton Pigg, MD (Referring OB/GYN) Delia Chimes, MD (PCP)

## 2018-07-12 NOTE — Progress Notes (Signed)
Talked with patient in depth about patient having an outpatient procedure that her physicians, Dr Gerarda Fraction and Dr Delsa Sale are planning to discharge patient home.  Patient was concerned and explained that she lives alone and has no one to stay with her postoperatively for the next 24 hours.  I called Dr Delsa Sale and explained this to her.  Dr Delsa Sale confirmed that the patient was talked to in depth about needing a family member or friend to stay with her.  Dr Glennon Mac asked that patient make arrangements while she is in PACU for care at home and ride home with family.  Patient said she would try and talk with her sister but she had not done so preoperatively.  Dr Delsa Sale is aware.

## 2018-07-12 NOTE — Transfer of Care (Signed)
Immediate Anesthesia Transfer of Care Note  Patient: Sherry [name]  Procedure(s) Performed: XI ROBOTIC ASSISTED RIGHT SALPINGO OOPHORECTOMY (Right ) LAPAROSCOPIC LYSIS OF ADHESIONS  Patient Location: PACU  Anesthesia Type:General  Level of Consciousness: drowsy and patient cooperative  Airway & Oxygen Therapy: Patient Spontanous Breathing and Patient connected to face mask oxygen  Post-op Assessment: Report given to RN and Post -op Vital signs reviewed and stable  Post vital signs: Reviewed and stable  Last Vitals:  Vitals Value Taken Time  BP 197/84 07/12/2018 10:17 AM  Temp    Pulse 65 07/12/2018 10:19 AM  Resp 17 07/12/2018 10:19 AM  SpO2 97 % 07/12/2018 10:19 AM  Vitals shown include unvalidated device data.  Last Pain:  Vitals:   07/12/18 0625  TempSrc: Oral  PainSc: 8       Patients Stated Pain Goal: 4 (50/75/73 2256)  Complications: No apparent anesthesia complications

## 2018-07-12 NOTE — Op Note (Signed)
OPERATIVE NOTE  Date: 07/12/18  Preoperative Diagnosis: Pelvic mass   Postoperative Diagnosis:  Right ovarian cyst  Procedure(s) Performed:  Robotic-assisted laparoscopic right salpingoophorectomy, Pelvic washings  Separate procedure for laparoscopic entry and lysis of adhesions by Dr. Johnathan Hausen with me as assist  Surgeon: Bernita Raisin, MD  Assistant Surgeon: Lahoma Crocker M.D. (an MD assistant was necessary for tissue manipulation, management of robotic instrumentation, retraction and positioning due to the complexity of the case and hospital policies).   Anesthesia: GETA  Specimens: right ovary, right fallopian tube, pelvic washings  Complications: none  Indication for Procedure:  Enlarging pelvic mass with pain  Operative Findings: Significant adhesive disease to anterior abdominal wall mesh requiring ~1 hour of lysis of adhesions by Dr. Hassell Done. Left ovary normal as far as could be visualized however it was adherent to the bowel mesentary and sidewall. Right ovary enlarged with cyst which on frozen section was c/w a benign cystadenoma, possible cystadenofibroma. Right ovary adherent to sigmoid colon epiploica and mesentary and to pelvic sidewall. I also performed lysis of adhesions ~62min however the majority of adhesiolysis was performed by Dr. Hassell Done.  Procedure in Detail:  The patient was positioned supine with her arms at her sides prior to anesthesia to ensure comfort. The patient was taken to the operating room and placed under general endotracheal anesthesia without difficulty. She was placed in a dorsolithotomy position and cervical acromial pads were placed. The patient had sequential compression devices for VTE prophylaxis.  The patient was then prepped in the usual sterile fashion.  Time out was performed.   A sponge on a stick was used for vaginal manipulation. Foley was placed by me.  Dr. Hassell Done was present in the room and made an 55mm incision in the  right upper quadrant. The 5 mm Optiview trocar was used to carefully and slowly enter the abdomen under direct visualization. With entry into the abdomen no liver injury noted. With maintenance of 15 mm of mercury the remainder of the abdomen was inspected. A clear window enabled [place] to place an 30mm robotic trocar in the right lateral abdominal wall under direct visualization. This allowed entry of an endoshear and Dr. Hassell Done was able to lyse the adhesions, of mostly omentum, from the anterior abdominal wall mesh. Once adequate exposure for the remaining trocars was achieved these remaining trocars were inserted. 72mm robotic trocars at the right midclavicular line and then 42mm robotic trocar just superior to the umbilicus and then finally a left lateral 33mm robotic trocar. All placed under direct visualization without complication.   The 37mm RUQ trocar was changed out to 73mm airseal. The patient was placed in trendelenburg position. There were some additional adhesions of the sigmoid colon to the high pelvic brim that Dr. Hassell Done was able to lyse for [place]. At this point the right adnexal mass was visualized and Dr. Delsa Sale and I continued the procedure. Pelvic washings were obtained.   The robot was docked. The mass/cyst was noted on the right sidewall with adhesions of the sigmoid draped across its medial and anterior surface. These adhesions were lysed until the cyst was able to be mobilized and . An incision was made on the right pelvic side wall peritoneum parallel to the IP ligament and the retroperitoneal space entered. The right ureter was identified and the para-rectal space was developed. A window was created in the broad ligament above the ureter. The infundibulopelvic vessels were skeletonized cauterized and transected. The right residual  utero-ovarian ligaments and  round ligament were cauterized and transected. Specimen was placed in an Endo Catch bag.  The contralateral adnexa was examined but  I felt the risk outweighed the benefit to remove this normal appearing adnexa in the face of adhesions to the bowel and sidewall.   The operative sites were inspected and hemostasis was noted.   The robot was undocked. The bag holding the right/tube ovary was brought to the same trocar site and the bag opened. The cyst was visualized and aspirated noting clear fluid. The mass was then slightly morcellated within the bag to facilitate removal from the abdominal cavity.  Frozen section returned benign.  The ports were all removed. The fascial closure at the right upper quadrant port was made with 0 Vicryl.  All incisions were closed with interrupted 4-0 vicryl and running subcuticular 4-0 Monocryl suture. Dermabond was applied.  Sponge, lap and needle counts were correct x 2          Disposition: PACU         Condition: Stable

## 2018-07-12 NOTE — Interval H&P Note (Signed)
History and Physical Interval Note:  07/12/2018 7:05 AM  Sherry [name]  has presented today for surgery, with the diagnosis of OVARIAN CYST  The various methods of treatment have been discussed with the patient and family. After consideration of risks, benefits and other options for treatment, the patient has consented to  Procedure(s): XI ROBOTIC ASSISTED UNILATERAL SALPINGO OOPHORECTOMY, POSSIBLE BILATERAL, POSSIBLE STAGING (N/A) as a surgical intervention .  The patient's history has been reviewed, patient examined, no change in status, stable for surgery.  I have reviewed the patient's chart and labs.  Questions were answered to the patient's satisfaction.     Isabel Caprice

## 2018-07-12 NOTE — Anesthesia Procedure Notes (Signed)
Procedure Name: Intubation Date/Time: 07/12/2018 7:35 AM Performed by: Claudia Desanctis, CRNA Pre-anesthesia Checklist: Patient identified, Emergency Drugs available, Suction available and Patient being monitored Patient Re-evaluated:Patient Re-evaluated prior to induction Oxygen Delivery Method: Circle system utilized Preoxygenation: Pre-oxygenation with 100% oxygen Induction Type: IV induction Ventilation: Two handed mask ventilation required Laryngoscope Size: 2 and Miller Grade View: Grade I Tube type: Oral Tube size: 7.0 mm Number of attempts: 1 Airway Equipment and Method: Stylet Placement Confirmation: ETT inserted through vocal cords under direct vision,  positive ETCO2 and breath sounds checked- equal and bilateral Secured at: 21 cm Tube secured with: Tape Dental Injury: Teeth and Oropharynx as per pre-operative assessment

## 2018-07-13 DIAGNOSIS — G459 Transient cerebral ischemic attack, unspecified: Secondary | ICD-10-CM | POA: Insufficient documentation

## 2018-07-13 DIAGNOSIS — I639 Cerebral infarction, unspecified: Secondary | ICD-10-CM | POA: Insufficient documentation

## 2018-07-13 HISTORY — DX: Transient cerebral ischemic attack, unspecified: G45.9

## 2018-07-14 ENCOUNTER — Encounter (HOSPITAL_COMMUNITY): Payer: Self-pay | Admitting: Obstetrics

## 2018-07-15 ENCOUNTER — Telehealth: Payer: Self-pay

## 2018-07-15 ENCOUNTER — Other Ambulatory Visit: Payer: Self-pay | Admitting: Gynecologic Oncology

## 2018-07-15 DIAGNOSIS — G8918 Other acute postprocedural pain: Secondary | ICD-10-CM

## 2018-07-15 MED ORDER — OXYCODONE HCL 5 MG PO TABS: 5.0000 mg | ORAL_TABLET | Freq: Three times a day (TID) | ORAL | 0 refills | Status: DC | PRN

## 2018-07-15 NOTE — Telephone Encounter (Addendum)
Told Sherry [name] that the pathology from 07-12-18 showed no cancer per Sherry John, Sherry [name]. Pt is eating and drinking fluids well.  Afebrile.  No vaginal bleeding. No BM since surgery. Instructed patient to take miralax. Now and repeat this evening if No BM. She can take the Miralax BID prn. Pt cc of lower abdominal pain. 8/10.  She has taken some extra strength Tylenol with minimal effect.  She is out of the Percocet.  She received #10 tablets.  Took one every six hrs since surgery. Reviewed with Sherry John, Sherry [name].  Told Sherry [name] that she needs to take the Tylenol every six hours and Sherry [name] is sending in Oxycodone 5 mg tabs to use every 8 hrs prn. Sending in # 5 tabs. Took tylenol out of pain med as pt has liver issues and using 500 mg of tylenol q 6 hrs separately. Pt verbalized understanding.

## 2018-07-15 NOTE — Progress Notes (Signed)
See RN note.  Patient rating pain 8 out of 10.  Taking one percocet every six hours.

## 2018-07-19 ENCOUNTER — Other Ambulatory Visit: Payer: Self-pay | Admitting: Family Medicine

## 2018-07-19 ENCOUNTER — Inpatient Hospital Stay: Payer: Medicare HMO | Attending: Obstetrics | Admitting: Obstetrics

## 2018-07-19 DIAGNOSIS — Z90710 Acquired absence of both cervix and uterus: Secondary | ICD-10-CM | POA: Insufficient documentation

## 2018-07-19 DIAGNOSIS — N83201 Unspecified ovarian cyst, right side: Secondary | ICD-10-CM | POA: Insufficient documentation

## 2018-07-20 ENCOUNTER — Other Ambulatory Visit: Payer: Self-pay | Admitting: Family Medicine

## 2018-07-20 MED ORDER — AMLODIPINE BESYLATE 10 MG PO TABS: 10.0000 mg | ORAL_TABLET | Freq: Every day | ORAL | 5 refills | Status: DC

## 2018-07-20 NOTE — Telephone Encounter (Signed)
Requested Prescriptions  Signed Prescriptions Disp Refills   amLODipine (NORVASC) 10 MG tablet 30 tablet 5    Sig: Take 1 tablet (10 mg total) by mouth daily.     Cardiovascular:  Calcium Channel Blockers Failed - 07/20/2018 12:52 PM      Failed - Last BP in normal range    BP Readings from Last 1 Encounters:  07/12/18 (!) 157/80         Passed - Valid encounter within last 6 months    Recent Outpatient Visits          2 months ago Chronic diastolic congestive heart failure ([place])   Primary Care at [place], [place]   5 months ago Left-sided weakness   Primary Care at [place], MD   5 months ago Essential hypertension   Primary Care at [facility], [place] A, MD   7 months ago Acute pain of left shoulder   Primary Care at [place], MD   7 months ago Essential hypertension   Primary Care at [place], MD           Refused Prescriptions Disp Refills  . labetalol (NORMODYNE) 100 MG tablet 60 tablet 3    Sig: Take 1 tablet (100 mg total) by mouth 2 (two) times daily.     Cardiovascular:  Beta Blockers Failed - 07/20/2018 12:52 PM      Failed - Last BP in normal range    BP Readings from Last 1 Encounters:  07/12/18 (!) 157/80         Passed - Last Heart Rate in normal range    Pulse Readings from Last 1 Encounters:  07/12/18 76         Passed - Valid encounter within last 6 months    Recent Outpatient Visits          2 months ago Chronic diastolic congestive heart failure ([place])   Primary Care at [place], MD   5 months ago Left-sided weakness   Primary Care at [place], MD   5 months ago Essential hypertension   Primary Care at [facility], [place] A, MD   7 months ago Acute pain of left shoulder   Primary Care at [facility], Ar[place] Solomons, MD   7 months ago Essential hypertension   Primary Care at [facility], Arlie Solomons, MD

## 2018-07-20 NOTE — Telephone Encounter (Signed)
Copied from [place] [id number]. Topic: Quick Communication - Rx Refill/Question >> Jul 20, 2018 12:39 PM Keene Breath wrote: Medication: amLODipine (NORVASC) 10 MG tablet / labetalol (NORMODYNE) 100 MG tablet  Patient called to request a refill for the above medication  Preferred Pharmacy (with phone number or street name): [id number] - [place], [place] - [address] AT [place] [phone] (Phone) [phone] (Fax)

## 2018-07-21 ENCOUNTER — Telehealth: Payer: Self-pay | Admitting: *Deleted

## 2018-07-21 NOTE — Telephone Encounter (Signed)
Returned the patient's call and rescheduled her missed post op appt

## 2018-07-27 ENCOUNTER — Telehealth: Payer: Self-pay | Admitting: *Deleted

## 2018-07-27 NOTE — Telephone Encounter (Signed)
Patient called to say that she was still having lots of vaginal pain. Patient states that she feels like her vagina has been stretched and a throbbing, consistent pain. Patient states that she is out of the percocet. Patient states that before she ran out of the percocet that she was taking the percocet and taking Tylenol every 4 to 6 hrs in between the percocet's.  Patient states that she has not used any heat or ice.  Patient also states that when she laughs, coughs, or sneezes that she is having more urine come out because of the vaginal pressure. Per Joylene John, NP,continue Tylenol, please make sure that you come to your follow-up appointment on this Friday, January 17th at 3pm., you can also alternate between heat and ice. Patient verbalized understanding.

## 2018-07-29 ENCOUNTER — Inpatient Hospital Stay (HOSPITAL_BASED_OUTPATIENT_CLINIC_OR_DEPARTMENT_OTHER): Payer: Medicare HMO | Admitting: Obstetrics

## 2018-07-29 ENCOUNTER — Encounter: Payer: Self-pay | Admitting: Obstetrics

## 2018-07-29 VITALS — BP 148/84 | HR 51 | Temp 98.2°F | Resp 18 | Ht 62.0 in | Wt 244.8 lb

## 2018-07-29 DIAGNOSIS — N83201 Unspecified ovarian cyst, right side: Secondary | ICD-10-CM

## 2018-07-29 DIAGNOSIS — Z90710 Acquired absence of both cervix and uterus: Secondary | ICD-10-CM

## 2018-07-29 NOTE — Patient Instructions (Signed)
Return to see Melissa one month for wound check If vaginal pressure worsening after 2 weeks let us know Try Monistat or equivalent over the counter.

## 2018-07-29 NOTE — Progress Notes (Signed)
S: Patient c/o fullness/heaviness down her vagina. No itching. No pain. Normal bowel/bladder function. No N/V.  I took her for laparoscopic (robotic) RSO 07/12/18. She had significant adhesive disease from a hernia repair with mesh (Dr. Hassell Done helped [place] with that).  Final pathology revealed a benign serous cystadenoma and right fallopian tube.  O:  Vitals:   07/29/18 1504  BP: (!) 148/84  Pulse: (!) 51  Resp: 18  Temp: 98.2 F (36.8 C)  SpO2: 100%   Abdo - soft, NTND, obese. Wounds CDI. No drainage or erythema. Pelvic/speculum- no lesions, no mass. Minimal pelvic relaxation. Vulvar folds with minimal yeast but vagina itself without drainage or discharge  A/P RTC one month with M.Cross, NP then disposition to referring Newton Pigg, MD) Vaginal pressure/discomfort - reassurance given. If worsening she can call us for imaging recommendations. Try OTC yeast medication.  Cc: Newton Pigg, MD (Referring Ob/Gyn) Forrest Moron, MD (PCP)

## 2018-08-18 ENCOUNTER — Other Ambulatory Visit: Payer: Self-pay | Admitting: Family Medicine

## 2018-08-18 NOTE — Telephone Encounter (Signed)
Requested medication (s) are due for refill today -yes  Requested medication (s) are on the active medication list -yes  Future visit scheduled -no  Last refill: Clonidine- 08/16/18                  Ondansetron-07/19/18  Notes to clinic: Attempted to call patient to verify request for additional Rx for 1 month- left message. Refilled medications that pass protocol for additional month. 2 Rx sent for review- 1 appointment needed and the other non delegated.   Requested Prescriptions  Pending Prescriptions Disp Refills   cloNIDine (CATAPRES) 0.1 MG tablet [Pharmacy Med Name: CLONIDINE 0.1MG  TAB] 90 tablet     Sig: TAKE 1 TABLET BY MOUTH THREE TIMES A DAY     Cardiovascular:  Alpha-2 Agonists Failed - 08/18/2018  8:30 AM      Failed - Last BP in normal range    BP Readings from Last 1 Encounters:  07/29/18 (!) 148/84         Failed - Valid encounter within last 6 months    Recent Outpatient Visits          3 months ago Chronic diastolic congestive heart failure ([place])   Primary Care at [facility], [place] A, MD   6 months ago Left-sided weakness   Primary Care at [facility] Arlie Solomons, MD   6 months ago Essential hypertension   Primary Care at [facility], [place] A, MD   8 months ago Acute pain of left shoulder   Primary Care at [facility], Arlie Solomons, MD   8 months ago Essential hypertension   Primary Care at [place], MD             Passed - Last Heart Rate in normal range    Pulse Readings from Last 1 Encounters:  07/29/18 (!) 51        ondansetron (ZOFRAN) 4 MG tablet [Pharmacy Med Name: ONDANSETRON 4MG  TAB] 20 tablet     Sig: TAKE 1 TABLET BY MOUTH EVERY 8 HOURS FOR UP TO SEVEN DAYS AS NEEDED FOR NAUSEA OR VOMITING     Not Delegated - Gastroenterology: Antiemetics Failed - 08/18/2018  8:30 AM      Failed - This refill cannot be delegated      Failed - Valid encounter within last 6 months    Recent Outpatient Visits          3 months ago Chronic  diastolic congestive heart failure ([place])   Primary Care at [facility], Arlie Solomons, MD   6 months ago Left-sided weakness   Primary Care at [place], MD   6 months ago Essential hypertension   Primary Care at [place], MD   8 months ago Acute pain of left shoulder   Primary Care at [facility], Arlie Solomons, MD   8 months ago Essential hypertension   Primary Care at [facility] [place], MD           Signed Prescriptions Disp Refills   gabapentin (NEURONTIN) 800 MG tablet 90 tablet 0    Sig: TAKE 1 TABLET BY MOUTH THREE TIMES A DAY     Neurology: Anticonvulsants - gabapentin Passed - 08/18/2018  8:30 AM      Passed - Valid encounter within last 12 months    Recent Outpatient Visits          3 months ago Chronic diastolic congestive heart failure ([place])   Primary  Care at [facility], Arlie Solomons, MD   6 months ago Left-sided weakness   Primary Care at [facility], Arlie Solomons, MD   6 months ago Essential hypertension   Primary Care at [facility], [place] A, MD   8 months ago Acute pain of left shoulder   Primary Care at [facility], [place] A, MD   8 months ago Essential hypertension   Primary Care at [facility], [place] A, MD            oxybutynin (DITROPAN-XL) 5 MG 24 hr tablet 30 tablet 0    Sig: TAKE 1 TABLET BY MOUTH AT BEDTIME     Urology:  Bladder Agents Passed - 08/18/2018  8:30 AM      Passed - Valid encounter within last 12 months    Recent Outpatient Visits          3 months ago Chronic diastolic congestive heart failure ([place])   Primary Care at [facility], Arlie Solomons, MD   6 months ago Left-sided weakness   Primary Care at [facility], Arlie Solomons, MD   6 months ago Essential hypertension   Primary Care at [facility], [place] A, MD   8 months ago Acute pain of left shoulder   Primary Care at [facility], [place] A, MD   8 months ago Essential hypertension   Primary Care at [place], MD               Requested Prescriptions  Pending Prescriptions Disp Refills   cloNIDine (CATAPRES) 0.1 MG tablet [Pharmacy Med Name: CLONIDINE 0.1MG  TAB] 90 tablet     Sig: TAKE 1 TABLET BY MOUTH THREE TIMES A DAY     Cardiovascular:  Alpha-2 Agonists Failed - 08/18/2018  8:30 AM      Failed - Last BP in normal range    BP Readings from Last 1 Encounters:  07/29/18 (!) 148/84         Failed - Valid encounter within last 6 months    Recent Outpatient Visits          3 months ago Chronic diastolic congestive heart failure ([place])   Primary Care at [facility], Arlie Solomons, MD   6 months ago Left-sided weakness   Primary Care at [facility], Arlie Solomons, MD   6 months ago Essential hypertension   Primary Care at [facility], [place] A, MD   8 months ago Acute pain of left shoulder   Primary Care at [facility], Arlie Solomons, MD   8 months ago Essential hypertension   Primary Care at [facility], [place], MD             Passed - Last Heart Rate in normal range    Pulse Readings from Last 1 Encounters:  07/29/18 (!) 51        ondansetron (ZOFRAN) 4 MG tablet [Pharmacy Med Name: ONDANSETRON 4MG  TAB] 20 tablet     Sig: TAKE 1 TABLET BY MOUTH EVERY 8 HOURS FOR UP TO SEVEN DAYS AS NEEDED FOR NAUSEA OR VOMITING     Not Delegated - Gastroenterology: Antiemetics Failed - 08/18/2018  8:30 AM      Failed - This refill cannot be delegated      Failed - Valid encounter within last 6 months    Recent Outpatient Visits          3 months ago Chronic diastolic congestive heart failure [facility])   Primary Care at [facility],  [place] A, MD   6 months ago Left-sided weakness   Primary Care at [facility], Arlie Solomons, MD   6 months ago Essential hypertension   Primary Care at [facility], [place] A, MD   8 months ago Acute pain of left shoulder   Primary Care at [facility], Arlie Solomons, MD   8 months ago Essential hypertension   Primary Care at [facility], [place], MD           Signed  Prescriptions Disp Refills   gabapentin (NEURONTIN) 800 MG tablet 90 tablet 0    Sig: TAKE 1 TABLET BY [place] A DAY     Neurology: Anticonvulsants - gabapentin Passed - 08/18/2018  8:30 AM      Passed - Valid encounter within last 12 months    Recent Outpatient Visits          3 months ago Chronic diastolic congestive heart failure ([place])   Primary Care at [facility], Arlie Solomons, MD   6 months ago Left-sided weakness   Primary Care at [facility], Arlie Solomons, MD   6 months ago Essential hypertension   Primary Care at [facility], [place] A, MD   8 months ago Acute pain of left shoulder   Primary Care at [facility], [place] A, MD   8 months ago Essential hypertension   Primary Care at [facility], [place] A, MD            oxybutynin (DITROPAN-XL) 5 MG 24 hr tablet 30 tablet 0    Sig: TAKE 1 TABLET BY MOUTH AT BEDTIME     Urology:  Bladder Agents Passed - 08/18/2018  8:30 AM      Passed - Valid encounter within last 12 months    Recent Outpatient Visits          3 months ago Chronic diastolic congestive heart failure ([place])   Primary Care at [facility], [place] A, MD   6 months ago Left-sided weakness   Primary Care at [facility], Arlie Solomons, MD   6 months ago Essential hypertension   Primary Care at [facility], [place] A, MD   8 months ago Acute pain of left shoulder   Primary Care at [facility], Arlie Solomons, MD   8 months ago Essential hypertension   Primary Care at [facility], Arlie Solomons, MD

## 2018-08-29 ENCOUNTER — Encounter: Payer: Self-pay | Admitting: Gynecologic Oncology

## 2018-08-29 ENCOUNTER — Inpatient Hospital Stay: Payer: Medicare HMO | Attending: Obstetrics | Admitting: Gynecologic Oncology

## 2018-08-29 VITALS — BP 135/72 | HR 51 | Temp 97.8°F | Resp 20 | Ht 62.0 in | Wt 247.6 lb

## 2018-08-29 DIAGNOSIS — D270 Benign neoplasm of right ovary: Secondary | ICD-10-CM | POA: Insufficient documentation

## 2018-08-29 DIAGNOSIS — Z90721 Acquired absence of ovaries, unilateral: Secondary | ICD-10-CM | POA: Insufficient documentation

## 2018-08-29 NOTE — Patient Instructions (Signed)
You are cleared to resume your normal activity including exercise.  Call our office if your bowel issues continue after resuming your exercise routine.  Please call for any questions or concerns.  No follow up necessary with GYN Oncology unless issues arise in the future.  Plan to follow up with your PCP and Dr. Ulanda Edison, GYN.

## 2018-08-29 NOTE — Progress Notes (Signed)
Gynecologic Oncology Follow Up  S: Patient returns to the office for post-op follow up.  She reports doing well since her last visit.  Tolerating diet with no nausea or emesis.  Bladder functioning without difficulty.  States the pelvic fullness she was experiencing at her last visit has resolved.  She does state that after surgery her bowels have changed and she has to take something for her bowels.  She currently uses Miralax intermittently and states she is only going every few days and that is not normal for her.  Passing flatus.  She has not taken pain medication (oxycodone) for several weeks.  No abdominal pain reported.  Asking about getting back into the gym.  No other concerns voiced.  She is s/p laparoscopic (robotic) RSO 07/12/18. She had significant adhesive disease from a hernia repair with mesh and Dr. Hassell Done with [facility] assisted with this during the case.  Final pathology revealed a benign serous cystadenoma and right fallopian tube.  O:  Vitals:   08/29/18 1029  BP: 135/72  Pulse: (!) 51  Resp: 20  Temp: 97.8 F (36.6 C)  SpO2: 100%   Alert, oriented x3 in no acute distress. Lungs clear. Heart regular rate and rhythm. Abdo - soft, NTND, obese. Incisions well healed. No drainage or erythema. No edema in lower extrem.  A/P She can resume normal activities with no restrictions.  Advised to monitor her constipation after resuming her exercise routine and to maintain hydration and to contact our office if her symptoms persist.  She is advised to call for any questions or concerns.  No follow up is necessary with GYN Oncology unless issues or concerns arise in the future.  She is to follow up with her PCP and Dr. Ulanda Edison for her well woman care.

## 2018-09-01 ENCOUNTER — Other Ambulatory Visit: Payer: Self-pay | Admitting: Family Medicine

## 2018-09-01 NOTE — Telephone Encounter (Signed)
Requested medication (s) are due for refill today: yes  Requested medication (s) are on the active medication list: yes, but current prescription is expired  Last refill:  Last refill 05/2018  Future visit scheduled: yes  Notes to clinic:  Current prescription is expired    Requested Prescriptions  Pending Prescriptions Disp Refills   terazosin (HYTRIN) 2 MG capsule [Pharmacy Med Name: TERAZOSIN 2MG  CAPSULES] 90 capsule 0    Sig: TAKE ONE CAPSULE BY MOUTH EVERY NIGHT AT BEDTIME     Cardiovascular:  Alpha Blockers Failed - 09/01/2018  3:28 PM      Failed - Valid encounter within last 6 months    Recent Outpatient Visits          3 months ago Chronic diastolic congestive heart failure ([place])   Primary Care at [facility], [place] A, MD   6 months ago Left-sided weakness   Primary Care at [place], MD   7 months ago Essential hypertension   Primary Care at [facility], [place] A, MD   8 months ago Acute pain of left shoulder   Primary Care at [facility], Arlie Solomons, MD   9 months ago Essential hypertension   Primary Care at [place], MD      Future Appointments            In 4 weeks Forrest Moron, MD Primary Care at [place], [place] BP in normal range    BP Readings from Last 1 Encounters:  08/29/18 135/72

## 2018-09-05 ENCOUNTER — Other Ambulatory Visit: Payer: Self-pay | Admitting: Family Medicine

## 2018-09-05 NOTE — Telephone Encounter (Signed)
Requested medication (s) are due for refill today: yes  Requested medication (s) are on the active medication list: yes  Last refill:  08/16/18, prescription for Clonidine expired  Future visit scheduled: yes  Notes to clinic:  Unable to refill per protocol. Clonidine prescription expired and Zofran refill cannot be delegated    Requested Prescriptions  Pending Prescriptions Disp Refills   cloNIDine (CATAPRES) 0.1 MG tablet [Pharmacy Med Name: CLONIDINE 0.1MG  TAB] 90 tablet     Sig: TAKE 1 TABLET BY MOUTH THREE TIMES A DAY     Cardiovascular:  Alpha-2 Agonists Failed - 09/05/2018  4:44 PM      Failed - Valid encounter within last 6 months    Recent Outpatient Visits          3 months ago Chronic diastolic congestive heart failure ([place])   Primary Care at [facility], Arlie Solomons, MD   6 months ago Left-sided weakness   Primary Care at [facility], Arlie Solomons, MD   7 months ago Essential hypertension   Primary Care at [facility], [place] A, MD   9 months ago Acute pain of left shoulder   Primary Care at [facility], Arlie Solomons, MD   9 months ago Essential hypertension   Primary Care at [facility], Arlie Solomons, MD      Future Appointments            In 3 weeks Forrest Moron, MD Primary Care at [place], [place] BP in normal range    BP Readings from Last 1 Encounters:  08/29/18 135/72         Passed - Last Heart Rate in normal range    Pulse Readings from Last 1 Encounters:  08/29/18 (!) 51        ondansetron (ZOFRAN) 4 MG tablet [Pharmacy Med Name: ONDANSETRON 4MG  TAB] 20 tablet 0    Sig: TAKE 1 TABLET BY MOUTH EVERY 8 HOURS FOR UP TO SEVEN DAYS AS NEEDED FOR NAUSEA OR VOMITING     Not Delegated - Gastroenterology: Antiemetics Failed - 09/05/2018  4:44 PM      Failed - This refill cannot be delegated      Failed - Valid encounter within last 6 months    Recent Outpatient Visits          3 months ago Chronic diastolic congestive heart  failure ([place])   Primary Care at [facility], Zoe A, MD   6 months ago Left-sided weakness   Primary Care at [place], MD   7 months ago Essential hypertension   Primary Care at [place], MD   9 months ago Acute pain of left shoulder   Primary Care at [place], MD   9 months ago Essential hypertension   Primary Care at [place], MD      Future Appointments            In 3 weeks Forrest Moron, MD Primary Care at [place], [facility]

## 2018-09-26 ENCOUNTER — Other Ambulatory Visit: Payer: Self-pay | Admitting: Family Medicine

## 2018-09-26 NOTE — Telephone Encounter (Signed)
Copied from [place] [id number]. Topic: Quick Communication - Rx Refill/Question >> Sep 26, 2018  3:16 PM Scherrie Gerlach wrote: Medication: cloNIDine (CATAPRES) 0.1 MG tablet  Dr Nolon Rod only gave pt a 10 day supply.  Pt takes TID. Pt has an appt 3/19, but needs refill now.  Dr Bridget Hartshorn out of the office until Thursday, but pt states she needs now.  Can anyone refill for her?  [id number] - [place], [place] - [address] AT [place] [address] [phone] (Phone) [phone] (Fax)

## 2018-09-27 ENCOUNTER — Other Ambulatory Visit: Payer: Self-pay | Admitting: Family Medicine

## 2018-09-27 MED ORDER — CLONIDINE HCL 0.1 MG PO TABS: 0.1000 mg | ORAL_TABLET | Freq: Three times a day (TID) | ORAL | 0 refills | Status: DC

## 2018-09-27 NOTE — Telephone Encounter (Signed)
Requested Prescriptions  Pending Prescriptions Disp Refills  . cloNIDine (CATAPRES) 0.1 MG tablet 90 tablet 0    Sig: Take 1 tablet (0.1 mg total) by mouth 3 (three) times daily.     Cardiovascular:  Alpha-2 Agonists Failed - 09/27/2018  8:46 AM      Failed - Valid encounter within last 6 months    Recent Outpatient Visits          4 months ago Chronic diastolic congestive heart failure ([place])   Primary Care at [facility], Zoe A, MD   7 months ago Left-sided weakness   Primary Care at [facility], Arlie Solomons, MD   8 months ago Essential hypertension   Primary Care at [facility], [place] A, MD   9 months ago Acute pain of left shoulder   Primary Care at [facility], Arlie Solomons, MD   10 months ago Essential hypertension   Primary Care at [name], MD      Future Appointments            In 2 days Forrest Moron, MD Primary Care at [place], [place] BP in normal range    BP Readings from Last 1 Encounters:  08/29/18 135/72         Passed - Last Heart Rate in normal range    Pulse Readings from Last 1 Encounters:  08/29/18 (!) 51

## 2018-09-29 ENCOUNTER — Ambulatory Visit: Payer: Self-pay

## 2018-09-29 ENCOUNTER — Inpatient Hospital Stay: Payer: Medicare HMO | Admitting: Family Medicine

## 2018-09-29 NOTE — Telephone Encounter (Signed)
Summary: call back about being self quarantined   Pt called very concerned about the her health situation. She had an appointment with Dr Nolon Rod today but was told not to come because she has congestive heart failure and didn't need to be in the office at this time. She is wanting to know should she be self quarantined? She would like a call back as soon as possible to discuss her instructions on what to do.    Advised pt to call telehealth- called number for pt and connected pt to telehealth.  Reason for Disposition . General information question, no triage required and triager able to answer question  Answer Assessment - Initial Assessment Questions 1. REASON FOR CALL or QUESTION: "What is your reason for calling today?" or "How can I best help you?" or "What question do you have that I can help answer?"     Summary: call back about being self quarantined   Pt called very concerned about the her health situation. She had an appointment with Dr Nolon Rod today but was told not to come because she has congestive heart failure and didn't need to be in the office at this time. She is wanting to know should she be self quarantined? She would like a call back as soon as possible to discuss her instructions on what to do.   *  Protocols used: INFORMATION ONLY CALL-A-AH

## 2018-10-03 ENCOUNTER — Telehealth: Payer: Self-pay

## 2018-10-03 ENCOUNTER — Other Ambulatory Visit: Payer: Self-pay | Admitting: Family Medicine

## 2018-10-03 NOTE — Telephone Encounter (Signed)
Please advise 

## 2018-10-03 NOTE — Telephone Encounter (Signed)
Spoke with pt about health concerns and being self quarantined.  Advised if she is not exhibiting any symptoms, exposure to virus or traveled to any of the hotspots where the virus is more prevalent then there is no need for quarantine.  Advised pt to practice good handwashing and staying away from large crowds and to not touch/mouth or nose areas.  Advised if she develops any new symptoms or starts to feel sick to contact or office.  Also advised we are offering our pt's telemedicine visits to cut down on covid-19 exposure and it is available to her if needed.  Pt agreeable. Dgaddy, CMA

## 2018-10-05 ENCOUNTER — Telehealth: Payer: Self-pay | Admitting: Family Medicine

## 2018-10-05 NOTE — Telephone Encounter (Signed)
Copied from [place] [id number]. Topic: Quick Communication - Rx Refill/Question >> Oct 05, 2018  4:29 PM Sheppard Coil, [employer] wrote: Medication: oxybutynin (DITROPAN-XL) 5 MG 24 hr tablet  Has the patient contacted their pharmacy? Yes - states it was denied  Pt called and left voicemail on [place] stating that pharmacy has told her this RX has been denied and she would like to know why. Pt did not leave a call back number.

## 2018-10-06 ENCOUNTER — Telehealth: Payer: Self-pay

## 2018-10-06 NOTE — Telephone Encounter (Signed)
Tried contacting pt via phone no answer.  Left message on voicemail to return office call.  Calling pt to advise her to ask her pharmacy to initiate prior authorization for the ditropan xl since it has been denied and we will handle it on our end once it has been started.  Unsure why it was denied but prior authorization investigation will help [place] find out why.  Will forward to [place] who does our prior authorizations once its received. Dgaddy, CMA

## 2018-10-06 NOTE — Telephone Encounter (Signed)
See telephone note 10/06/2018. Dgaddy, CMA

## 2018-10-06 NOTE — Telephone Encounter (Signed)
Please advise 

## 2018-10-07 ENCOUNTER — Other Ambulatory Visit: Payer: Self-pay | Admitting: Family Medicine

## 2018-10-07 NOTE — Telephone Encounter (Signed)
Forwarding to office as they were working on PA for this medication

## 2018-10-08 ENCOUNTER — Other Ambulatory Visit: Payer: Self-pay | Admitting: Family Medicine

## 2018-10-08 NOTE — Telephone Encounter (Signed)
Please see previous note.

## 2018-10-08 NOTE — Telephone Encounter (Signed)
Please send to [facility]

## 2018-10-10 ENCOUNTER — Telehealth: Payer: Self-pay | Admitting: Family Medicine

## 2018-10-10 NOTE — Telephone Encounter (Signed)
Patient called wanting the nurse to call her and discuss with her why her two medications that the pharmacy sent a request in was denied.  [phone]

## 2018-10-11 ENCOUNTER — Other Ambulatory Visit: Payer: Self-pay | Admitting: Family Medicine

## 2018-10-11 NOTE — Telephone Encounter (Signed)
Called pt and LVM for pt to call the office back about concerns.

## 2018-10-11 NOTE — Telephone Encounter (Signed)
Copied from [place] [id number]. Topic: Quick Communication - See Telephone Encounter >> Oct 11, 2018 10:14 AM Loma [place] wrote: CRM for notification. See Telephone encounter for: 10/11/18.oxybutynin (DITROPAN-XL) 5 MG 24 hr tablet cloNIDine (CATAPRES) 0.1 MG tablet Pt missed communacations from dr office this am, looks like it is being worked on but pt is wanting to make sure that we are aware that [employer] says a PA is needed on these drugs and that the amts may have to be for 30 days and more refills. Please FU with pt if this is an issue with resolving her refills.

## 2018-10-12 NOTE — Telephone Encounter (Signed)
Patient called back in regards to a message [place] had left for her 3/31. Requesting a call back. Patient irate due to trying to fill medication for over a month.

## 2018-10-13 ENCOUNTER — Telehealth: Payer: Self-pay | Admitting: Family Medicine

## 2018-10-13 ENCOUNTER — Other Ambulatory Visit: Payer: Self-pay

## 2018-10-13 MED ORDER — CLONIDINE HCL 0.1 MG PO TABS: 0.1000 mg | ORAL_TABLET | Freq: Three times a day (TID) | ORAL | 0 refills | Status: DC

## 2018-10-13 MED ORDER — OXYBUTYNIN CHLORIDE ER 5 MG PO TB24: 5.0000 mg | ORAL_TABLET | Freq: Every day | ORAL | 0 refills | Status: DC

## 2018-10-13 NOTE — Telephone Encounter (Signed)
Spoke with patient and sent in RX.

## 2018-10-13 NOTE — Telephone Encounter (Signed)
Copied from [place] [id number]. Topic: General - Other >> Oct 13, 2018  9:26 AM Richardo Priest, NT wrote: Reason for CRM:  Patient called back, states this is the 5th time this week she is calling inquiring about her medication oxybutynin (DITROPAN-XL) 5 MG 24 hr tablet and cloNIDine (CATAPRES) 0.1 MG tablet. Patient was very irate, requesting a call back to further discuss her options and where the practice is in the process of filling her requests.

## 2018-10-17 ENCOUNTER — Other Ambulatory Visit: Payer: Self-pay

## 2018-10-17 MED ORDER — ISOSORBIDE MONONITRATE ER 60 MG PO TB24: 60.0000 mg | ORAL_TABLET | Freq: Every day | ORAL | 0 refills | Status: DC

## 2018-10-17 NOTE — Telephone Encounter (Signed)
RX sent in

## 2018-10-17 NOTE — Telephone Encounter (Signed)
Brandy, from [facility], called requesting a med refill for isosorbide mononitrate (IMDUR) 60 MG 24 hr tablet. Theadora Rama states pt has changed pharmacies and they have never refilled this medication for her so they need an order sent. Please advise.     [place], [address]  [place] [address]  Phone: [phone] Fax: [phone]  Not a 24 hour pharmacy; exact hours not known.

## 2018-10-20 ENCOUNTER — Telehealth: Payer: Self-pay | Admitting: Family Medicine

## 2018-10-20 NOTE — Telephone Encounter (Signed)
Patient calling to check status of this refill. She was advised of turnaround time. Patient is requesting to speak with CMA about this refill request.

## 2018-10-20 NOTE — Telephone Encounter (Signed)
Patient said she was unaware this medication was discontinued for 600mg . She wants to continue to get back on this.

## 2018-10-21 NOTE — Telephone Encounter (Signed)
I left vm for pt to call office if she has any questions. Pt can talk with any clinical staff member.  Per pt chart Gabapentin 800 mg was sent to pt pharmacy on 10/03/2018.

## 2018-10-27 ENCOUNTER — Other Ambulatory Visit: Payer: Self-pay

## 2018-10-27 ENCOUNTER — Telehealth (INDEPENDENT_AMBULATORY_CARE_PROVIDER_SITE_OTHER): Payer: Medicare HMO | Admitting: Family Medicine

## 2018-10-27 DIAGNOSIS — D509 Iron deficiency anemia, unspecified: Secondary | ICD-10-CM

## 2018-10-27 DIAGNOSIS — F411 Generalized anxiety disorder: Secondary | ICD-10-CM

## 2018-10-27 DIAGNOSIS — G4709 Other insomnia: Secondary | ICD-10-CM | POA: Diagnosis not present

## 2018-10-27 DIAGNOSIS — R200 Anesthesia of skin: Secondary | ICD-10-CM

## 2018-10-27 DIAGNOSIS — I10 Essential (primary) hypertension: Secondary | ICD-10-CM

## 2018-10-27 MED ORDER — GABAPENTIN 600 MG PO TABS: 600.0000 mg | ORAL_TABLET | Freq: Three times a day (TID) | ORAL | 3 refills | Status: DC

## 2018-10-27 MED ORDER — TERAZOSIN HCL 2 MG PO CAPS: 2.0000 mg | ORAL_CAPSULE | Freq: Every day | ORAL | 1 refills | Status: DC

## 2018-10-27 MED ORDER — FERROUS SULFATE 325 (65 FE) MG PO TABS: 325.0000 mg | ORAL_TABLET | Freq: Every day | ORAL | 3 refills | Status: DC

## 2018-10-27 NOTE — Progress Notes (Signed)
Telemedicine Encounter- SOAP NOTE Established Patient  This telephone encounter was conducted with the patient's (or proxy's) verbal consent via audio telecommunications: yes/no: Yes Patient was instructed to have this encounter in a suitably private space; and to only have persons present to whom they give permission to participate. In addition, patient identity was confirmed by use of name plus two identifiers (DOB and address).  I discussed the limitations, risks, security and privacy concerns of performing an evaluation and management service by telephone and the availability of in person appointments. I also discussed with the patient that there may be a patient responsible charge related to this service. The patient expressed understanding and agreed to proceed.   CC: anxiety, med adjustment  Subjective   Sherry [name] is a 63 y.o. established patient. Telephone visit today for  HPI  GAD She takes prozac for stress and anxiety She reports that she is staying home and has cut down from TV reports and the news She states that it is hard for her to sleep sometimes but the gabapentin seems to help.  Depression screen [facility] 2/9 05/13/2018 02/08/2018 12/01/2017  Decreased Interest 0 0 2  Down, Depressed, Hopeless 0 1 2  PHQ - 2 Score 0 1 4  Altered sleeping - - 3  Tired, decreased energy - - 3  Change in appetite - - 3  Feeling bad or failure about yourself  - - 0  Trouble concentrating - - 0  Moving slowly or fidgety/restless - - 0  Suicidal thoughts - - 0  PHQ-9 Score - - 13  Difficult doing work/chores - - Somewhat difficult  Some encounter information is confidential and restricted. Go to Review Flowsheets activity to see all data.     Hypertension She reports that her bp cuff needs batteries She states that she is compliant with her home bp meds She needs terazosin BP Readings from Last 3 Encounters:  08/29/18 135/72  07/29/18 (!) 148/84  07/12/18 (!) 157/80     Microcytic anemia Lab Results  Component Value Date   HGB 10.3 (L) 06/27/2018   She is taking some iron She states that she would like a refill of her iron supplement She states that the other irons ar less  Left side numbness She states that she is taking gabapentin 800mg  but would like to decrease the dose 600mg  because the 800mg  is too strong She states that she was exercising before the pandemic   Patient Active Problem List   Diagnosis Date Noted  . Cyst of ovary   . Pre-operative cardiovascular examination 06/13/2018  . Dizziness 02/24/2018  . Left-sided weakness 02/24/2018  . Cigarette smoker 12/31/2017  . DOE (dyspnea on exertion) 12/30/2017  . Phlebitis after infusion 10/28/2017  . Hypertensive emergency 10/21/2017  . Acute diastolic CHF (congestive heart failure) ([place]) 10/21/2017  . Microcytic anemia 10/21/2017  . Medication intolerance 09/06/2017  . Neuroleptic-induced tardive dyskinesia 09/06/2017  . Cancer ([place]) 09/06/2017  . Chronic low back pain 09/06/2017  . Grief reaction with prolonged bereavement 09/06/2017  . Opioid use disorder, severe, dependence ([place]) 08/27/2017  . Alcohol use disorder, severe, dependence ([place]) 08/27/2017  . Substance induced mood disorder ([place]) 08/13/2017  . AKI (acute kidney injury) ([place])   . MDD (major depressive disorder), recurrent severe, without psychosis ([place])   . Alcohol withdrawal ([place]) 08/09/2017  . Essential hypertension 07/06/2017  . Chronic kidney disease, stage III (moderate) ([place]) 11/17/2016  . Anxiety 08/21/2016  . Hx of bipolar disorder 01/31/2016  .  History of breast cancer 02/13/2013  . Morbid obesity due to excess calories ([place]) 02/13/2013  . Hepatitis C virus infection cured after antiviral drug therapy 12/17/2012    Past Medical History:  Diagnosis Date  . Anxiety   . Bipolar and related disorder ([place])   . Breast cancer ([place])   . CHF (congestive heart failure) ([place])   . Chronic back pain   . Chronic  kidney disease    "Stage IV" - states due to hypertension  . Hepatitis C   . Hypertension   . Left-sided weakness   . MRSA infection    of breast incision  . Opioid dependence ([place])    Has been treated in [facility] in past    Current Outpatient Medications  Medication Sig Dispense Refill  . amLODipine (NORVASC) 10 MG tablet Take 1 tablet (10 mg total) by mouth daily. 30 tablet 5  . cloNIDine (CATAPRES) 0.1 MG tablet Take 1 tablet (0.1 mg total) by mouth 3 (three) times daily. 90 tablet 0  . ferrous sulfate 325 (65 FE) MG tablet Take 1 tablet (325 mg total) by mouth daily with breakfast. 90 tablet 3  . FLUoxetine (PROZAC) 20 MG tablet Take 20 mg by mouth every evening.     . gabapentin (NEURONTIN) 600 MG tablet Take 1 tablet (600 mg total) by mouth 3 (three) times daily. 90 tablet 3  . hydrALAZINE (APRESOLINE) 100 MG tablet Take 100 mg by mouth 3 (three) times daily.     . INGREZZA 80 MG CAPS Take 80 mg by mouth daily.    . isosorbide mononitrate (IMDUR) 60 MG 24 hr tablet Take 1 tablet (60 mg total) by mouth daily. 90 tablet 0  . labetalol (NORMODYNE) 100 MG tablet Take 1 tablet (100 mg total) by mouth 2 (two) times daily. 60 tablet 3  . Multiple Vitamins-Minerals (ADULT GUMMY) CHEW Chew 1 tablet by mouth daily.    . ondansetron (ZOFRAN) 4 MG tablet TAKE 1 TABLET BY MOUTH EVERY 8 HOURS FOR UP TO SEVEN DAYS AS NEEDED FOR NAUSEA OR VOMITING 20 tablet 0  . oxybutynin (DITROPAN-XL) 5 MG 24 hr tablet Take 1 tablet (5 mg total) by mouth at bedtime. 30 tablet 0  . pantoprazole (PROTONIX) 20 MG tablet Take 1 tablet (20 mg total) by mouth 2 (two) times daily. 180 tablet 3  . terazosin (HYTRIN) 2 MG capsule Take 1 capsule (2 mg total) by mouth at bedtime. 90 capsule 1   No current facility-administered medications for this visit.     Allergies  Allergen Reactions  . Abilify [Aripiprazole] Other (See Comments)    Tardive dyskinesia Oral  . Remeron [Mirtazapine] Other (See Comments)     Wgt stimulation /gain, Dizziness, Patient says "can tolerate"  . Trazodone And Nefazodone Other (See Comments)    Nightmares/sleep diturbance  . Flexeril [Cyclobenzaprine] Other (See Comments)    Pt states Flexeril makes her feel depressed   . Amoxicillin Diarrhea and Other (See Comments)    NOTE the patient has had PCN WITHOUT reaction Has patient had a PCN reaction causing immediate rash, facial/tongue/throat swelling, SOB or lightheadedness with hypotension: No Has patient had a PCN reaction causing severe rash involving mucus membranes or skin necrosis: No Has patient had a PCN reaction that required hospitalization: No Has patient had a PCN reaction occurring within the last 10 years: No If all of the above answers are "NO", then may proceed with Cephalosporin use.     Social History  Socioeconomic History  . Marital status: Single    Spouse name: Not on file  . Number of children: 0  . Years of education: 35  . Highest education level: High school graduate  Occupational History  . Occupation: Disabled  Social Needs  . Financial resource strain: Not on file  . Food insecurity:    Worry: Not on file    Inability: Not on file  . Transportation needs:    Medical: Not on file    Non-medical: Not on file  Tobacco Use  . Smoking status: Current Every Day Smoker    Packs/day: 1.00    Types: Cigarettes  . Smokeless tobacco: Never Used  Substance and Sexual Activity  . Alcohol use: No  . Drug use: Not Currently    Comment: h/o IVD use  . Sexual activity: Not on file  Lifestyle  . Physical activity:    Days per week: Not on file    Minutes per session: Not on file  . Stress: Not on file  Relationships  . Social connections:    Talks on phone: Not on file    Gets together: Not on file    Attends religious service: Not on file    Active member of club or organization: Not on file    Attends meetings of clubs or organizations: Not on file    Relationship status: Not on  file  . Intimate partner violence:    Fear of current or ex partner: Not on file    Emotionally abused: Not on file    Physically abused: Not on file    Forced sexual activity: Not on file  Other Topics Concern  . Not on file  Social History Narrative   Lives at home alone.   Right-handed.   Caffeine use: 4 cups coffee/soda    ROS Review of Systems  Constitutional: Negative for activity change, appetite change, chills and fever.  HENT: Negative for congestion, nosebleeds, trouble swallowing and voice change.   Respiratory: Negative for cough, shortness of breath and wheezing.   Gastrointestinal: Negative for diarrhea, nausea and vomiting.  Genitourinary: Negative for difficulty urinating, dysuria, flank pain and hematuria.  Musculoskeletal: Negative for back pain, joint swelling and neck pain.  Neurological: Negative for dizziness, speech difficulty, light-headedness and numbness.  See HPI. All other review of systems negative.   Objective   Vitals as reported by the patient: There were no vitals filed for this visit.  Diagnoses and all orders for this visit:  Microcytic anemia- refilled iron  -     ferrous sulfate 325 (65 FE) MG tablet; Take 1 tablet (325 mg total) by mouth daily with breakfast.  Malignant hypertension- pt on multiple meds, improved compliance in the past weeks  Left sided numbness- decreased dose of gabapentin to 600mg    Other insomnia- continue melatonin, prozac and clonidine  Overactive bladder- continue current meds  Other orders -     gabapentin (NEURONTIN) 600 MG tablet; Take 1 tablet (600 mg total) by mouth 3 (three) times daily. -     terazosin (HYTRIN) 2 MG capsule; Take 1 capsule (2 mg total) by mouth at bedtime.     I discussed the assessment and treatment plan with the patient. The patient was provided an opportunity to ask questions and all were answered. The patient agreed with the plan and demonstrated an understanding of the  instructions.   The patient was advised to call back or seek an in-person evaluation if the symptoms worsen  or if the condition fails to improve as anticipated.   Forrest Moron, MD  Primary Care at [facility]

## 2018-10-27 NOTE — Progress Notes (Signed)
CC: Pt requesting gabapentin 800 mg to be reduced to 600 mg and needs refill on terazosin and iron.

## 2018-10-27 NOTE — Patient Instructions (Signed)
° ° ° °  If you have lab work done today you will be contacted with your lab results within the next 2 weeks.  If you have not heard from us then please contact us. The fastest way to get your results is to register for My Chart. ° ° °IF you received an x-ray today, you will receive an invoice from [facility]. Please contact [facility] at [phone] with questions or concerns regarding your invoice.  ° °IF you received labwork today, you will receive an invoice from [facility]. Please contact [facility] at [phone] with questions or concerns regarding your invoice.  ° °Our billing staff will not be able to assist you with questions regarding bills from these companies. ° °You will be contacted with the lab results as soon as they are available. The fastest way to get your results is to activate your My Chart account. Instructions are located on the last page of this paperwork. If you have not heard from us regarding the results in 2 weeks, please contact this office. °  ° ° ° °

## 2018-10-31 ENCOUNTER — Other Ambulatory Visit: Payer: Self-pay | Admitting: Family Medicine

## 2018-10-31 NOTE — Telephone Encounter (Signed)
Gabapentin prescription sent to [facility]. Previously sent to [place].

## 2018-11-01 ENCOUNTER — Inpatient Hospital Stay: Payer: Medicare HMO | Admitting: Family Medicine

## 2018-11-08 ENCOUNTER — Other Ambulatory Visit: Payer: Self-pay | Admitting: Family Medicine

## 2018-11-08 NOTE — Telephone Encounter (Signed)
Please advise 

## 2018-11-11 ENCOUNTER — Other Ambulatory Visit: Payer: Self-pay

## 2018-11-11 DIAGNOSIS — I10 Essential (primary) hypertension: Secondary | ICD-10-CM

## 2018-11-15 ENCOUNTER — Encounter: Payer: Self-pay | Admitting: Family Medicine

## 2018-11-15 ENCOUNTER — Other Ambulatory Visit: Payer: Self-pay

## 2018-11-15 ENCOUNTER — Ambulatory Visit (INDEPENDENT_AMBULATORY_CARE_PROVIDER_SITE_OTHER): Payer: Medicare HMO | Admitting: Family Medicine

## 2018-11-15 VITALS — BP 136/84 | HR 91 | Temp 99.1°F | Resp 20 | Ht 62.21 in | Wt 242.8 lb

## 2018-11-15 DIAGNOSIS — Z716 Tobacco abuse counseling: Secondary | ICD-10-CM

## 2018-11-15 DIAGNOSIS — K439 Ventral hernia without obstruction or gangrene: Secondary | ICD-10-CM

## 2018-11-15 DIAGNOSIS — Z6841 Body Mass Index (BMI) 40.0 and over, adult: Secondary | ICD-10-CM | POA: Diagnosis not present

## 2018-11-15 DIAGNOSIS — I10 Essential (primary) hypertension: Secondary | ICD-10-CM

## 2018-11-15 DIAGNOSIS — G4709 Other insomnia: Secondary | ICD-10-CM

## 2018-11-15 DIAGNOSIS — F322 Major depressive disorder, single episode, severe without psychotic features: Secondary | ICD-10-CM

## 2018-11-15 DIAGNOSIS — R6889 Other general symptoms and signs: Secondary | ICD-10-CM | POA: Diagnosis not present

## 2018-11-15 DIAGNOSIS — R1013 Epigastric pain: Secondary | ICD-10-CM

## 2018-11-15 MED ORDER — NICOTINE 14 MG/24HR TD PT24: 14.0000 mg | MEDICATED_PATCH | Freq: Every day | TRANSDERMAL | 0 refills | Status: DC

## 2018-11-15 MED ORDER — VARENICLINE TARTRATE 0.5 MG X 11 & 1 MG X 42 PO MISC: ORAL | 0 refills | Status: DC

## 2018-11-15 NOTE — Progress Notes (Signed)
A total of 25 minutes were spent non-face-to-face with the patient during this encounter over the telephone.

## 2018-11-15 NOTE — Patient Instructions (Addendum)
If you have lab work done today you will be contacted with your lab results within the next 2 weeks.  If you have not heard from [place] then please contact us. The fastest way to get your results is to register for My Chart.   IF you received an x-ray today, you will receive an invoice from [facility]. Please contact [facility] at [phone] with questions or concerns regarding your invoice.   IF you received labwork today, you will receive an invoice from [place]. Please contact [facility] at [phone] with questions or concerns regarding your invoice.   Our billing staff will not be able to assist you with questions regarding bills from these companies.  You will be contacted with the lab results as soon as they are available. The fastest way to get your results is to activate your My Chart account. Instructions are located on the last page of this paperwork. If you have not heard from [place] regarding the results in 2 weeks, please contact this office.    Varenicline oral tablets What is this medicine? VARENICLINE (var EN i kleen) is used to help people quit smoking. It is used with a patient support program recommended by your physician. This medicine may be used for other purposes; ask your health care provider or pharmacist if you have questions. COMMON BRAND NAME(S): Chantix What should I tell my health care provider before I take this medicine? They need to know if you have any of these conditions: -heart disease -if you often drink alcohol -kidney disease -mental illness -on hemodialysis -seizures -history of stroke -suicidal thoughts, plans, or attempt; a previous suicide attempt by you or a family member -an unusual or allergic reaction to varenicline, other medicines, foods, dyes, or preservatives -pregnant or trying to get pregnant -breast-feeding How should I use this medicine? Take this medicine by mouth after eating. Take with a full glass of  water. Follow the directions on the prescription label. Take your doses at regular intervals. Do not take your medicine more often than directed. There are 3 ways you can use this medicine to help you quit smoking; talk to your health care professional to decide which plan is right for you: 1) you can choose a quit date and start this medicine 1 week before the quit date, or, 2) you can start taking this medicine before you choose a quit date, and then pick a quit date between day 8 and 35 days of treatment, or, 3) if you are not sure that you are able or willing to quit smoking right away, start taking this medicine and slowly decrease the amount you smoke as directed by your health care professional with the goal of being cigarette-free by week 12 of treatment. Stick to your plan; ask about support groups or other ways to help you remain cigarette-free. If you are motivated to quit smoking and did not succeed during a previous attempt with this medicine for reasons other than side effects, or if you returned to smoking after this treatment, speak with your health care professional about whether another course of this medicine may be right for you. A special MedGuide will be given to you by the pharmacist with each prescription and refill. Be sure to read this information carefully each time. Talk to your pediatrician regarding the use of this medicine in children. This medicine is not approved for use in children. Overdosage: If you think you have taken too much of this medicine contact a  poison control center or emergency room at once. NOTE: This medicine is only for you. Do not share this medicine with others. What if I miss a dose? If you miss a dose, take it as soon as you can. If it is almost time for your next dose, take only that dose. Do not take double or extra doses. What may interact with this medicine? -alcohol -insulin -other medicines used to help people quit  smoking -theophylline -warfarin This list may not describe all possible interactions. Give your health care provider a list of all the medicines, herbs, non-prescription drugs, or dietary supplements you use. Also tell them if you smoke, drink alcohol, or use illegal drugs. Some items may interact with your medicine. What should I watch for while using this medicine? It is okay if you do not succeed at your attempt to quit and have a cigarette. You can still continue your quit attempt and keep using this medicine as directed. Just throw away your cigarettes and get back to your quit plan. Talk to your health care provider before using other treatments to quit smoking. Using this medicine with other treatments to quit smoking may increase the risk for side effects compared to using a treatment alone. You may get drowsy or dizzy. Do not drive, use machinery, or do anything that needs mental alertness until you know how this medicine affects you. Do not stand or sit up quickly, especially if you are an older patient. This reduces the risk of dizzy or fainting spells. Decrease the number of alcoholic beverages that you drink during treatment with this medicine until you know if this medicine affects your ability to tolerate alcohol. Some people have experienced increased drunkenness (intoxication), unusual or sometimes aggressive behavior, or no memory of things that have happened (amnesia) during treatment with this medicine. Sleepwalking can happen during treatment with this medicine, and can sometimes lead to behavior that is harmful to you, other people, or property. Stop taking this medicine and tell your doctor if you start sleepwalking or have other unusual sleep-related activity. After taking this medicine, you may get up out of bed and do an activity that you do not know you are doing. The next morning, you may have no memory of this. Activities include driving a car ("sleep-driving"), making and  eating food, talking on the phone, sexual activity, and sleep-walking. Serious injuries have occurred. Stop the medicine and call your doctor right away if you find out you have done any of these activities. Do not take this medicine if you have used alcohol that evening. Do not take it if you have taken another medicine for sleep. The risk of doing these sleep-related activities is higher. Patients and their families should watch out for new or worsening depression or thoughts of suicide. Also watch out for sudden changes in feelings such as feeling anxious, agitated, panicky, irritable, hostile, aggressive, impulsive, severely restless, overly excited and hyperactive, or not being able to sleep. If this happens, call your health care professional. If you have diabetes and you quit smoking, the effects of insulin may be increased and you may need to reduce your insulin dose. Check with your doctor or health care professional about how you should adjust your insulin dose. What side effects may I notice from receiving this medicine? Side effects that you should report to your doctor or health care professional as soon as possible: -allergic reactions like skin rash, itching or hives, swelling of the face, lips, tongue, or throat -  acting aggressive, being angry or violent, or acting on dangerous impulses -breathing problems -changes in emotions or moods -chest pain or chest tightness -feeling faint or lightheaded, falls -hallucination, loss of contact with reality -mouth sores -redness, blistering, peeling or loosening of the skin, including inside the mouth -signs and symptoms of a stroke like changes in vision; confusion; trouble speaking or understanding; severe headaches; sudden numbness or weakness of the face, arm or leg; trouble walking; dizziness; loss of balance or coordination -seizures -sleepwalking -suicidal thoughts or other mood changes Side effects that usually do not require medical  attention (report to your doctor or health care professional if they continue or are bothersome): -constipation -gas -headache -nausea, vomiting -strange dreams -trouble sleeping This list may not describe all possible side effects. Call your doctor for medical advice about side effects. You may report side effects to FDA at 1-800-FDA-1088. Where should I keep my medicine? Keep out of the reach of children. Store at room temperature between 15 and 30 degrees C (59 and 86 degrees F). Throw away any unused medicine after the expiration date. NOTE: This sheet is a summary. It may not cover all possible information. If you have questions about this medicine, talk to your doctor, pharmacist, or health care provider.  2019 Elsevier/Gold Standard (2017-12-24 12:48:08)

## 2018-11-15 NOTE — Progress Notes (Signed)
Established Patient Office Visit  Subjective:  Patient ID: Sherry [name], female    DOB: December 18, 1955  Age: 64 y.o. MRN: [id number]  CC:  Chief Complaint  Patient presents with  . Abdominal Pain    X 2 week- upper  . Fatigue    X 4 days  . Depression    screening done score 13  . Anxiety    screening done score 10    HPI Sherry [name] presents for   2 weeks of abdominal pain that is throbbing She states that it is tender to the touch that is nonradiating She states that it causes cramping and causes her to have to bend at the waist She states that she also has fatigue She has had 2 abdominal surgeries isn 2019 and one of which was a hernia repair, the other was a salpingoophorectomy   She says "I am in a really nonchalant mood" and "I don't really care about much" and all I want to do is sleep.  She wanted to try chantix to quit smoking.  Patient gets Vergas at [place] She reports that her prozac was increased to 40mg  about 2 weeks ago  She said she has not been able sleep She states that she has been dealing with mood disorder and difficulty with interest over the past month so her [facility] specialist increased the fluoxetine   She smokes a pack a day  She has been smoking since age 64 off and on but continuously for over 20 years She states that she is ready to quit because "I want to live. I want to get better".  Depression screen [facility] 2/9 11/15/2018 05/13/2018 02/08/2018 12/01/2017  Decreased Interest 2 0 0 2  Down, Depressed, Hopeless 2 0 1 2  PHQ - 2 Score 4 0 1 4  Altered sleeping 2 - - 3  Tired, decreased energy 3 - - 3  Change in appetite 1 - - 3  Feeling bad or failure about yourself  0 - - 0  Trouble concentrating 1 - - 0  Moving slowly or fidgety/restless 2 - - 0  Suicidal thoughts 0 - - 0  PHQ-9 Score 13 - - 13  Difficult doing work/chores Somewhat difficult - - Somewhat difficult  Some encounter information is confidential and restricted. Go to Review Flowsheets  activity to see all data.     Past Medical History:  Diagnosis Date  . Anxiety   . Bipolar and related disorder ([place])   . Breast cancer ([place])   . CHF (congestive heart failure) ([place])   . Chronic back pain   . Chronic kidney disease    "Stage IV" - states due to hypertension  . Hepatitis C   . Hypertension   . Left-sided weakness   . MRSA infection    of breast incision  . Opioid dependence ([place])    Has been treated in [facility] in past    Past Surgical History:  Procedure Laterality Date  . BACK SURGERY  1990  . BREAST SURGERY     "breast cancer survivor" - states partial mastectomy and nodes  . LAPAROSCOPIC CHOLECYSTECTOMY  2002  . LAPAROSCOPIC INCISIONAL / UMBILICAL / VENTRAL HERNIA REPAIR  04/13/2018   with BARD 15x 09TO mesh (supraumbilical)  . LAPAROSCOPIC LYSIS OF ADHESIONS  07/12/2018   Procedure: LAPAROSCOPIC LYSIS OF ADHESIONS;  Surgeon: Isabel Caprice, MD;  Location: [facility];  Service: Gynecology;;  . MYOMECTOMY     x 2 prior to hysterectomy  .  ROBOTIC ASSISTED BILATERAL SALPINGO OOPHERECTOMY Right 07/12/2018   Procedure: XI ROBOTIC ASSISTED RIGHT SALPINGO OOPHORECTOMY;  Surgeon: Isabel Caprice, MD;  Location: [facility];  Service: Gynecology;  Laterality: Right;  . TOTAL ABDOMINAL HYSTERECTOMY     fibroids     Family History  Problem Relation Age of Onset  . Heart attack Mother   . Breast cancer Mother 33  . Dementia Mother   . Cancer Father 75       died of bleeding from kidneys    Social History   Socioeconomic History  . Marital status: Single    Spouse name: Not on file  . Number of children: 0  . Years of education: 64  . Highest education level: High school graduate  Occupational History  . Occupation: Disabled  Social Needs  . Financial resource strain: Not on file  . Food insecurity:    Worry: Not on file    Inability: Not on file  . Transportation needs:    Medical: Not on file    Non-medical: Not on file  Tobacco Use  .  Smoking status: Current Every Day Smoker    Packs/day: 1.00    Types: Cigarettes  . Smokeless tobacco: Never Used  Substance and Sexual Activity  . Alcohol use: No  . Drug use: Not Currently    Comment: h/o IVD use  . Sexual activity: Not Currently  Lifestyle  . Physical activity:    Days per week: Not on file    Minutes per session: Not on file  . Stress: Not on file  Relationships  . Social connections:    Talks on phone: Not on file    Gets together: Not on file    Attends religious service: Not on file    Active member of club or organization: Not on file    Attends meetings of clubs or organizations: Not on file    Relationship status: Not on file  . Intimate partner violence:    Fear of current or ex partner: Not on file    Emotionally abused: Not on file    Physically abused: Not on file    Forced sexual activity: Not on file  Other Topics Concern  . Not on file  Social History Narrative   Lives at home alone.   Right-handed.   Caffeine use: 4 cups coffee/soda    Outpatient Medications Prior to Visit  Medication Sig Dispense Refill  . amLODipine (NORVASC) 10 MG tablet Take 1 tablet (10 mg total) by mouth daily. 30 tablet 5  . cloNIDine (CATAPRES) 0.1 MG tablet TAKE 1 TABLET BY MOUTH THREE TIMES A DAY 30 tablet 0  . ferrous sulfate 325 (65 FE) MG tablet Take 1 tablet (325 mg total) by mouth daily with breakfast. 90 tablet 3  . FLUoxetine (PROZAC) 20 MG tablet Take 20 mg by mouth every evening.     . gabapentin (NEURONTIN) 600 MG tablet Take 1 tablet (600 mg total) by mouth 3 (three) times daily. 90 tablet 3  . gabapentin (NEURONTIN) 800 MG tablet TAKE 1 TABLET BY MOUTH THREE TIMES A DAY 90 tablet 3  . hydrALAZINE (APRESOLINE) 100 MG tablet Take 100 mg by mouth 3 (three) times daily.     . INGREZZA 80 MG CAPS Take 80 mg by mouth daily.    . isosorbide mononitrate (IMDUR) 60 MG 24 hr tablet Take 1 tablet (60 mg total) by mouth daily. 90 tablet 0  . labetalol  (NORMODYNE) 100 MG tablet  TAKE 1 TABLET BY MOUTH TWICE A DAY 60 tablet 0  . ondansetron (ZOFRAN) 4 MG tablet TAKE 1 TABLET BY MOUTH EVERY 8 HOURS FOR UP TO SEVEN DAYS AS NEEDED FOR NAUSEA OR VOMITING 20 tablet 0  . oxybutynin (DITROPAN-XL) 5 MG 24 hr tablet TAKE 1 TABLET (5 MG TOTAL) BY MOUTH AT BEDTIME. 30 tablet 0  . pantoprazole (PROTONIX) 20 MG tablet Take 1 tablet (20 mg total) by mouth 2 (two) times daily. 180 tablet 3  . terazosin (HYTRIN) 2 MG capsule Take 1 capsule (2 mg total) by mouth at bedtime. 90 capsule 1  . Multiple Vitamins-Minerals (ADULT GUMMY) CHEW Chew 1 tablet by mouth daily.     No facility-administered medications prior to visit.     Allergies  Allergen Reactions  . Abilify [Aripiprazole] Other (See Comments)    Tardive dyskinesia Oral  . Remeron [Mirtazapine] Other (See Comments)    Wgt stimulation /gain, Dizziness, Patient says "can tolerate"  . Trazodone And Nefazodone Other (See Comments)    Nightmares/sleep diturbance  . Flexeril [Cyclobenzaprine] Other (See Comments)    Pt states Flexeril makes her feel depressed   . Amoxicillin Diarrhea and Other (See Comments)    NOTE the patient has had PCN WITHOUT reaction Has patient had a PCN reaction causing immediate rash, facial/tongue/throat swelling, SOB or lightheadedness with hypotension: No Has patient had a PCN reaction causing severe rash involving mucus membranes or skin necrosis: No Has patient had a PCN reaction that required hospitalization: No Has patient had a PCN reaction occurring within the last 10 years: No If all of the above answers are "NO", then may proceed with Cephalosporin use.     ROS Review of Systems    Objective:    Physical Exam  BP 136/84 (BP Location: Right Arm, Patient Position: Sitting, Cuff Size: Large)   Pulse 91   Temp 99.1 F (37.3 C) (Oral)   Resp 20   Ht 5' 2.21" (1.58 m)   Wt 242 lb 12.8 oz (110.1 kg)   SpO2 96%   BMI 44.12 kg/m  Wt Readings from Last 3  Encounters:  11/15/18 242 lb 12.8 oz (110.1 kg)  08/29/18 247 lb 9.6 oz (112.3 kg)  07/29/18 244 lb 12.8 oz (111 kg)   Physical Exam  Constitutional: Oriented to person, place, and time. Appears well-developed and well-nourished.  HENT:  Head: Normocephalic and atraumatic.  Eyes: Conjunctivae and EOM are normal.  Cardiovascular: Normal rate, regular rhythm, normal heart sounds and intact distal pulses.  No murmur heard. Pulmonary/Chest: Effort normal and breath sounds normal. No stridor. No respiratory distress. Has no wheezes.  Abdomen: obese, tender below the xiphoid process in a circular area measuring about 3cmx3cm that also is a site of an incisional scar from laparoscopic surgery No rebound, no guarding  Neurological: Is alert and oriented to person, place, and time.  Skin: Skin is warm. Capillary refill takes less than 2 seconds.  Psychiatric: Has a normal mood and affect. Behavior is normal. Judgment and thought content normal.    Health Maintenance Due  Topic Date Due  . TETANUS/TDAP  11/25/1974    There are no preventive care reminders to display for this patient.  Lab Results  Component Value Date   TSH 0.882 10/21/2017   Lab Results  Component Value Date   WBC 4.6 06/27/2018   HGB 10.3 (L) 06/27/2018   HCT 34.4 (L) 06/27/2018   MCV 93.0 06/27/2018   PLT 272 06/27/2018   Lab  Results  Component Value Date   NA 138 06/27/2018   K 4.9 06/27/2018   CO2 24 06/27/2018   GLUCOSE 102 (H) 06/27/2018   BUN 31 (H) 06/27/2018   CREATININE 2.12 (H) 06/27/2018   BILITOT 0.4 06/27/2018   ALKPHOS 101 06/27/2018   AST 12 (L) 06/27/2018   ALT 16 06/27/2018   PROT 7.3 06/27/2018   ALBUMIN 3.8 06/27/2018   CALCIUM 9.2 06/27/2018   ANIONGAP 7 06/27/2018   No results found for: CHOL No results found for: HDL No results found for: LDLCALC No results found for: TRIG No results found for: CHOLHDL Lab Results  Component Value Date   HGBA1C 5.3 09/13/2017       Assessment & Plan:   Problem List Items Addressed This Visit    None    Visit Diagnoses    Malignant hypertension    -  Primary Discussed her current meds Continue  Very good readings today   Abdominal pain, epigastric     Could be incisional hernia or ventral hernia or mass Obese abdomen so exam was difficulty but given the location and her previous surgeries will rule out hernia   Relevant Orders   Basic metabolic panel   CT ABDOMEN PELVIS WO CONTRAST   Ventral hernia without obstruction or gangrene     Discussed risk factors for hernia -- obesity, previous surgery   Relevant Orders   Basic metabolic panel   CT ABDOMEN PELVIS WO CONTRAST   Morbid obesity ([place])       Encounter for smoking cessation counseling    -  Discussed chantix And nicotine replacement Continue with BH   Current active depression - pt with apathy, prozac was increased by [facility]   Insomnia Pt previously tried remeron, trazodone and seroquel which she did not tolerate She is asking for a benzodiazepine Declined at this time She should try the increased dose of prozac first    Meds ordered this encounter  Medications  . nicotine (NICODERM CQ - DOSED IN MG/24 HOURS) 14 mg/24hr patch    Sig: Place 1 patch (14 mg total) onto the skin daily.    Dispense:  28 patch    Refill:  0  . varenicline (CHANTIX STARTING MONTH PAK) 0.5 MG X 11 & 1 MG X 42 tablet    Sig: Take one 0.5 mg tablet by mouth once daily for 3 days, then increase to one 0.5 mg tablet twice daily for 4 days, then increase to one 1 mg tablet twice daily.    Dispense:  53 tablet    Refill:  0    Follow-up: No follow-ups on file.    Forrest Moron, MD

## 2018-11-16 LAB — BASIC METABOLIC PANEL
BUN/Creatinine Ratio: 11 — ABNORMAL LOW (ref 12–28)
BUN: 23 mg/dL (ref 8–27)
CO2: 23 mmol/L (ref 20–29)
Calcium: 9.2 mg/dL (ref 8.7–10.3)
Chloride: 103 mmol/L (ref 96–106)
Creatinine, Ser: 2.16 mg/dL — ABNORMAL HIGH (ref 0.57–1.00)
GFR calc Af Amer: 27 mL/min/{1.73_m2} — ABNORMAL LOW (ref >59)
GFR calc non Af Amer: 24 mL/min/{1.73_m2} — ABNORMAL LOW (ref >59)
Glucose: 90 mg/dL (ref 65–99)
Potassium: 5.4 mmol/L — ABNORMAL HIGH (ref 3.5–5.2)
Sodium: 138 mmol/L (ref 134–144)

## 2018-11-17 ENCOUNTER — Ambulatory Visit: Payer: Self-pay

## 2018-11-17 DIAGNOSIS — N281 Cyst of kidney, acquired: Secondary | ICD-10-CM | POA: Diagnosis not present

## 2018-11-17 DIAGNOSIS — R197 Diarrhea, unspecified: Secondary | ICD-10-CM | POA: Diagnosis not present

## 2018-11-17 DIAGNOSIS — D3501 Benign neoplasm of right adrenal gland: Secondary | ICD-10-CM | POA: Diagnosis not present

## 2018-11-17 DIAGNOSIS — K5730 Diverticulosis of large intestine without perforation or abscess without bleeding: Secondary | ICD-10-CM | POA: Diagnosis not present

## 2018-11-17 DIAGNOSIS — R112 Nausea with vomiting, unspecified: Secondary | ICD-10-CM | POA: Diagnosis not present

## 2018-11-17 DIAGNOSIS — R1013 Epigastric pain: Secondary | ICD-10-CM | POA: Diagnosis not present

## 2018-11-17 DIAGNOSIS — F17210 Nicotine dependence, cigarettes, uncomplicated: Secondary | ICD-10-CM | POA: Diagnosis not present

## 2018-11-17 DIAGNOSIS — I700 Atherosclerosis of aorta: Secondary | ICD-10-CM | POA: Diagnosis not present

## 2018-11-17 DIAGNOSIS — D3502 Benign neoplasm of left adrenal gland: Secondary | ICD-10-CM | POA: Diagnosis not present

## 2018-11-17 DIAGNOSIS — D3500 Benign neoplasm of unspecified adrenal gland: Secondary | ICD-10-CM | POA: Diagnosis not present

## 2018-11-17 NOTE — Telephone Encounter (Signed)
Pt called reporting 9/10 abdominal pain.  States she is not able to get CT for another week d/t [facility] transportation. Discussed other possibilities for transportation to get CT earlier but pt unable to obtain transportation prior to one week away.  Based on report of significant pain, stated need for pain medication and dfficulty obtaining diagnostic imaging advised to go to ED for assessment of abdominal pain as medicine would only treat pain but not the underlying condition.  Pt agreeable and states she will go to ED for eval.

## 2018-11-17 NOTE — Telephone Encounter (Signed)
Pt called and says that she is still having upper abdominal pain.  She is requesting to speak with Dr Nolon Rod for pain medication. Pt states this pain started suddenly two weeks ago. She is not nauseated.  She has had a BM today she states a small BM. She is rating the pain at 9.  She gives HX of hernia repair last year and at Christmas she has surgery on a ovary for a cyst. She reports that the CT ordered can not be done until next Thursday. Per protocol pt was asked to go to ER for further evaluation of her symptom. Pt refused and call was placed to office. Call transfered [facility].  Reason for Disposition . [1] SEVERE pain AND [2] age > 41  Answer Assessment - Initial Assessment Questions 1. LOCATION: "Where does it hurt?"      Top of stomach 2. RADIATION: "Does the pain shoot anywhere else?" (e.g., chest, back)    no 3. ONSET: "When did the pain begin?" (e.g., minutes, hours or days ago)      2weeks 4. SUDDEN: "Gradual or sudden onset?"     sudden 5. PATTERN "Does the pain come and go, or is it constant?"    - If constant: "Is it getting better, staying the same, or worsening?"      (Note: Constant means the pain never goes away completely; most serious pain is constant and it progresses)     - If intermittent: "How long does it last?" "Do you have pain now?"     (Note: Intermittent means the pain goes away completely between bouts)    constant 6. SEVERITY: "How bad is the pain?"  (e.g., Scale 1-10; mild, moderate, or severe)   - MILD (1-3): doesn't interfere with normal activities, abdomen soft and not tender to touch    - MODERATE (4-7): interferes with normal activities or awakens from sleep, tender to touch    - SEVERE (8-10): excruciating pain, doubled over, unable to do any normal activities     9 7. RECURRENT SYMPTOM: "Have you ever had this type of abdominal pain before?" If so, ask: "When was the last time?" and "What happened that time?"     Hernia last year, cyst on overi  removed this year 8. CAUSE: "What do you think is causing the abdominal pain?"     Possible hernia 9. RELIEVING/AGGRAVATING FACTORS: "What makes it better or worse?" (e.g., movement, antacids, bowel movement)    nothing 10. OTHER SYMPTOMS: "Has there been any vomiting, diarrhea, constipation, or urine problems?"      Small bm today, no 11. PREGNANCY: "Is there any chance you are pregnant?" "When was your last menstrual period?"      No  Protocols used: ABDOMINAL PAIN - [facility]

## 2018-11-22 ENCOUNTER — Telehealth (INDEPENDENT_AMBULATORY_CARE_PROVIDER_SITE_OTHER): Payer: Medicare HMO | Admitting: Family Medicine

## 2018-11-22 ENCOUNTER — Other Ambulatory Visit: Payer: Self-pay

## 2018-11-22 DIAGNOSIS — N183 Chronic kidney disease, stage 3 unspecified: Secondary | ICD-10-CM

## 2018-11-22 DIAGNOSIS — R42 Dizziness and giddiness: Secondary | ICD-10-CM

## 2018-11-22 DIAGNOSIS — Z9181 History of falling: Secondary | ICD-10-CM | POA: Diagnosis not present

## 2018-11-22 DIAGNOSIS — N179 Acute kidney failure, unspecified: Secondary | ICD-10-CM

## 2018-11-22 DIAGNOSIS — F17200 Nicotine dependence, unspecified, uncomplicated: Secondary | ICD-10-CM | POA: Diagnosis not present

## 2018-11-22 DIAGNOSIS — R1013 Epigastric pain: Secondary | ICD-10-CM | POA: Diagnosis not present

## 2018-11-22 DIAGNOSIS — Z9189 Other specified personal risk factors, not elsewhere classified: Secondary | ICD-10-CM | POA: Diagnosis not present

## 2018-11-22 DIAGNOSIS — I10 Essential (primary) hypertension: Secondary | ICD-10-CM | POA: Diagnosis not present

## 2018-11-22 DIAGNOSIS — K439 Ventral hernia without obstruction or gangrene: Secondary | ICD-10-CM

## 2018-11-22 NOTE — Progress Notes (Signed)
Telemedicine Encounter- SOAP NOTE Established Patient  This telephone encounter was conducted with the patient's (or proxy's) verbal consent via audio telecommunications: yes/no: Yes Patient was instructed to have this encounter in a suitably private space; and to only have persons present to whom they give permission to participate. In addition, patient identity was confirmed by use of name plus two identifiers (DOB and address).  I discussed the limitations, risks, security and privacy concerns of performing an evaluation and management service by telephone and the availability of in person appointments. I also discussed with the patient that there may be a patient responsible charge related to this service. The patient expressed understanding and agreed to proceed.  I spent a total of TIME; 0 MIN TO 60 MIN: 25 minutes talking with the patient or their proxy.  No chief complaint on file.   Subjective   Sherry [name] is a 63 y.o. established patient. Telephone visit today for  HPI  Chronic pain and frequent falls She reports that she has been having continued pain She has a hard time getting in and out of the tub for showers, making meals and help her to get around She has a cane and a walker for assistance with mobility She has a history of frequent falls and fell on 11/17/2018 She has a history of chronic opiates and had to go to [facility] She also has a history of alcohol withdrawal but no longer drink alcohol She continues to smoke cigarettes but is sober  She would like better pain mgmt and a home health aide She reports that she has difficulty with getting around her home and struggles with daily living  Due to her use of opiates she has not been opiates for her pain She reports that she went to the ER for her abdominal pain  She staes that it is unchanged since our last office visit on 11/15/2018  Depression screen [facility] 2/9 11/22/2018 11/15/2018 05/13/2018 02/08/2018 12/01/2017   Decreased Interest 0 2 0 0 2  Down, Depressed, Hopeless 0 2 0 1 2  PHQ - 2 Score 0 4 0 1 4  Altered sleeping - 2 - - 3  Tired, decreased energy - 3 - - 3  Change in appetite - 1 - - 3  Feeling bad or failure about yourself  - 0 - - 0  Trouble concentrating - 1 - - 0  Moving slowly or fidgety/restless - 2 - - 0  Suicidal thoughts - 0 - - 0  PHQ-9 Score - 13 - - 13  Difficult doing work/chores - Somewhat difficult - - Somewhat difficult  Some encounter information is confidential and restricted. Go to Review Flowsheets activity to see all data.     Essential hypertension, Tobacco use Patient reports that she is taking her bp meds as prescribed She denies any chest pains, palpitations, shortness of breath She states that she gets dizzy often  She knows that she has to take her bp meds  She would like to quit smoking and picked up chantix but could not afford the patches BP Readings from Last 3 Encounters:  11/15/18 136/84  08/29/18 135/72  07/29/18 (!) 148/84   Epigastric Abdominal Pain (ER Follow) She reports that she has continued abdominal pain She is having bowel movements She denies vomiting now but after the ER visit she has not been vomiting. She is taking zofran for nausea. She denies fevers or chills.  CLINICAL DATA: Acute epigastric abdominal pain.  EXAM:  CT ABDOMEN AND PELVIS WITHOUT CONTRAST    TECHNIQUE:  Multidetector CT imaging of the abdomen and pelvis was performed  following the standard protocol without IV contrast.    COMPARISON: CT scan of May 26, 2018.    FINDINGS:  Lower chest: No acute abnormality.    Hepatobiliary: No focal liver abnormality is seen. Status post  cholecystectomy. No biliary dilatation.    Pancreas: Unremarkable. No pancreatic ductal dilatation or  surrounding inflammatory changes.    Spleen: Normal in size without focal abnormality.    Adrenals/Urinary Tract: Stable bilateral adrenal  adenomas. Stable  bilateral renal cysts are noted. No hydronephrosis or renal  obstruction is noted. No renal or ureteral calculi are noted.  Urinary bladder is unremarkable.    Stomach/Bowel: Stomach is within normal limits. Appendix appears  normal. No evidence of bowel wall thickening, distention, or  inflammatory changes. Sigmoid diverticulosis is noted without  inflammation.    Vascular/Lymphatic: Aortic atherosclerosis. No enlarged abdominal or  pelvic lymph nodes.    Reproductive: Status post hysterectomy. No adnexal masses.    Other: Status post previous repair of abdominal wall hernia. No  significant hernia is noted currently. No ascites is noted.    Musculoskeletal: No acute or significant osseous findings.    IMPRESSION:  Stable bilateral adrenal adenomas.    Stable bilateral renal cysts.    Sigmoid diverticulosis without inflammation.    No acute abnormality seen in the abdomen or pelvis.    Aortic Atherosclerosis (ICD10-I70.0).      Electronically Signed  By: Marijo Conception M.D.  On: 11/17/2018 17:54      Patient Active Problem List   Diagnosis Date Noted  . Cyst of ovary   . Pre-operative cardiovascular examination 06/13/2018  . Dizziness 02/24/2018  . Left-sided weakness 02/24/2018  . Cigarette smoker 12/31/2017  . DOE (dyspnea on exertion) 12/30/2017  . Phlebitis after infusion 10/28/2017  . Hypertensive emergency 10/21/2017  . Acute diastolic CHF (congestive heart failure) ([place]) 10/21/2017  . Microcytic anemia 10/21/2017  . Medication intolerance 09/06/2017  . Neuroleptic-induced tardive dyskinesia 09/06/2017  . Cancer ([place]) 09/06/2017  . Chronic low back pain 09/06/2017  . Grief reaction with prolonged bereavement 09/06/2017  . Opioid use disorder, severe, dependence ([place]) 08/27/2017  . Alcohol use disorder, severe, dependence ([place]) 08/27/2017  . Substance induced mood disorder ([place]) 08/13/2017  . AKI  (acute kidney injury) ([place])   . MDD (major depressive disorder), recurrent severe, without psychosis ([place])   . Alcohol withdrawal ([place]) 08/09/2017  . Essential hypertension 07/06/2017  . Chronic kidney disease, stage III (moderate) ([place]) 11/17/2016  . Anxiety 08/21/2016  . Hx of bipolar disorder 01/31/2016  . History of breast cancer 02/13/2013  . Morbid obesity due to excess calories ([place]) 02/13/2013  . Hepatitis C virus infection cured after antiviral drug therapy 12/17/2012    Past Medical History:  Diagnosis Date  . Anxiety   . Bipolar and related disorder ([place])   . Breast cancer ([place])   . CHF (congestive heart failure) ([place])   . Chronic back pain   . Chronic kidney disease    "Stage IV" - states due to hypertension  . Hepatitis C   . Hypertension   . Left-sided weakness   . MRSA infection    of breast incision  . Opioid dependence ([place])    Has been treated in [facility] in past    Current Outpatient Medications  Medication Sig Dispense Refill  . amLODipine (NORVASC) 10 MG  tablet Take 1 tablet (10 mg total) by mouth daily. 30 tablet 5  . cloNIDine (CATAPRES) 0.1 MG tablet TAKE 1 TABLET BY MOUTH THREE TIMES A DAY 30 tablet 0  . ferrous sulfate 325 (65 FE) MG tablet Take 1 tablet (325 mg total) by mouth daily with breakfast. 90 tablet 3  . FLUoxetine (PROZAC) 20 MG tablet Take 20 mg by mouth every evening.     . gabapentin (NEURONTIN) 600 MG tablet Take 1 tablet (600 mg total) by mouth 3 (three) times daily. 90 tablet 3  . hydrALAZINE (APRESOLINE) 100 MG tablet Take 100 mg by mouth 3 (three) times daily.     . INGREZZA 80 MG CAPS Take 80 mg by mouth daily.    . isosorbide mononitrate (IMDUR) 60 MG 24 hr tablet Take 1 tablet (60 mg total) by mouth daily. 90 tablet 0  . labetalol (NORMODYNE) 100 MG tablet TAKE 1 TABLET BY MOUTH TWICE A DAY 60 tablet 0  . Multiple Vitamins-Minerals (ADULT GUMMY) CHEW Chew 1 tablet by mouth daily.    . nicotine (NICODERM CQ - DOSED IN MG/24  HOURS) 14 mg/24hr patch Place 1 patch (14 mg total) onto the skin daily. 28 patch 0  . ondansetron (ZOFRAN) 4 MG tablet TAKE 1 TABLET BY MOUTH EVERY 8 HOURS FOR UP TO SEVEN DAYS AS NEEDED FOR NAUSEA OR VOMITING 20 tablet 0  . oxybutynin (DITROPAN-XL) 5 MG 24 hr tablet TAKE 1 TABLET (5 MG TOTAL) BY MOUTH AT BEDTIME. 30 tablet 0  . pantoprazole (PROTONIX) 20 MG tablet Take 1 tablet (20 mg total) by mouth 2 (two) times daily. 180 tablet 3  . terazosin (HYTRIN) 2 MG capsule Take 1 capsule (2 mg total) by mouth at bedtime. 90 capsule 1  . varenicline (CHANTIX STARTING MONTH PAK) 0.5 MG X 11 & 1 MG X 42 tablet Take one 0.5 mg tablet by mouth once daily for 3 days, then increase to one 0.5 mg tablet twice daily for 4 days, then increase to one 1 mg tablet twice daily. 53 tablet 0  . gabapentin (NEURONTIN) 800 MG tablet TAKE 1 TABLET BY MOUTH THREE TIMES A DAY 90 tablet 3   No current facility-administered medications for this visit.     Allergies  Allergen Reactions  . Abilify [Aripiprazole] Other (See Comments)    Tardive dyskinesia Oral  . Remeron [Mirtazapine] Other (See Comments)    Wgt stimulation /gain, Dizziness, Patient says "can tolerate"  . Trazodone And Nefazodone Other (See Comments)    Nightmares/sleep diturbance  . Flexeril [Cyclobenzaprine] Other (See Comments)    Pt states Flexeril makes her feel depressed   . Amoxicillin Diarrhea and Other (See Comments)    NOTE the patient has had PCN WITHOUT reaction Has patient had a PCN reaction causing immediate rash, facial/tongue/throat swelling, SOB or lightheadedness with hypotension: No Has patient had a PCN reaction causing severe rash involving mucus membranes or skin necrosis: No Has patient had a PCN reaction that required hospitalization: No Has patient had a PCN reaction occurring within the last 10 years: No If all of the above answers are "NO", then may proceed with Cephalosporin use.     Social History   Socioeconomic  History  . Marital status: Single    Spouse name: Not on file  . Number of children: 0  . Years of education: 66  . Highest education level: High school graduate  Occupational History  . Occupation: Disabled  Social Needs  . Emergency planning/management officer  strain: Not on file  . Food insecurity:    Worry: Not on file    Inability: Not on file  . Transportation needs:    Medical: Not on file    Non-medical: Not on file  Tobacco Use  . Smoking status: Current Every Day Smoker    Packs/day: 1.00    Types: Cigarettes  . Smokeless tobacco: Never Used  Substance and Sexual Activity  . Alcohol use: No  . Drug use: Not Currently    Comment: h/o IVD use  . Sexual activity: Not Currently  Lifestyle  . Physical activity:    Days per week: Not on file    Minutes per session: Not on file  . Stress: Not on file  Relationships  . Social connections:    Talks on phone: Not on file    Gets together: Not on file    Attends religious service: Not on file    Active member of club or organization: Not on file    Attends meetings of clubs or organizations: Not on file    Relationship status: Not on file  . Intimate partner violence:    Fear of current or ex partner: Not on file    Emotionally abused: Not on file    Physically abused: Not on file    Forced sexual activity: Not on file  Other Topics Concern  . Not on file  Social History Narrative   Lives at home alone.   Right-handed.   Caffeine use: 4 cups coffee/soda    ROS Review of Systems  Constitutional: Negative for activity change, appetite change, chills and fever.  HENT: Negative for congestion, nosebleeds, trouble swallowing and voice change.   Respiratory: Negative for cough, shortness of breath and wheezing.   Gastrointestinal: see hpi Genitourinary: Negative for difficulty urinating, dysuria, flank pain and hematuria.  Musculoskeletal: Negative for back pain, joint swelling and neck pain.  Neurological: see hpi See HPI. All  other review of systems negative.   Objective   Due to this being a telephone encounter could not perform a physical exam She did not have conversational dyspnea Her speech was her usual slurring Normal thought process   Vitals as reported by the patient: There were no vitals filed for this visit.  Diagnoses and all orders for this visit:  Essential hypertension- bp well controlled on current regimen  Epigastric Pain/Ventral hernia without obstruction or gangrene-  Advised to follow up with Dr. Chevis Pretty who did her hernia repair in 2019  At high risk for falls- will order a home aide  Chronic kidney disease, stage III (moderate) ([place])- discussed that she cannot take NSAIDs due to her kidney disease.   At risk for abuse of opiates- will not prescribe tramadol due to her previous history of opiate abuse   Tobacco use disorder- continue Chantix, unable to afford nicotine patches, discussed other means of support to aid with quitting     I discussed the assessment and treatment plan with the patient. The patient was provided an opportunity to ask questions and all were answered. The patient agreed with the plan and demonstrated an understanding of the instructions.   The patient was advised to call back or seek an in-person evaluation if the symptoms worsen or if the condition fails to improve as anticipated.  I provided 25 minutes of non-face-to-face time during this encounter.  Forrest Moron, MD  Primary Care at [facility]

## 2018-11-22 NOTE — Progress Notes (Signed)
CC: fell twice on yesterday, pain level 8/10, tylenol for pain w/no relief.  Pt c/o soreness all over.  Pt advises she has gone for test and they found nothing wrong but she is still falling and says she needs a full body examination and workup because she knows something is wrong with her.  No travel outside the [place] or  in the past 3 weeks.

## 2018-11-24 ENCOUNTER — Other Ambulatory Visit: Payer: Medicare HMO

## 2018-11-24 ENCOUNTER — Telehealth: Payer: Self-pay | Admitting: Family Medicine

## 2018-11-24 NOTE — Telephone Encounter (Signed)
Copied from [place]. Topic: Quick Communication - See Telephone Encounter >> Nov 24, 2018  1:15 PM Sheran Luz wrote: CRM for notification. See Telephone encounter for: 11/24/18.  Meagan, with [name], calling regarding referral received on 5/12. Meagan states that referral was just for home health aide. She states that order would need to have a skill (OT, PT, RN) before they could accept it. She inquired if new order could be sent in with specific request or if she can non-admit patient. Please contact Meagan directly to advise.   Meagan # [phone]

## 2018-11-25 NOTE — Telephone Encounter (Signed)
Patient is calling in to check on home health order.

## 2018-11-25 NOTE — Telephone Encounter (Signed)
Copied from [place] [id number]. Topic: Referral - Request for Referral >> Nov 24, 2018  2:51 PM Ivar Drape wrote: Has patient seen PCP for this complaint? YES *If NO, is insurance requiring patient see PCP for this issue before PCP can refer them? Referral for which specialty:  Neurologist Preferred provider/office:  NO Reason for referral:  Because she fell and hit her head

## 2018-11-25 NOTE — Telephone Encounter (Signed)
Please see note below. 

## 2018-11-27 DIAGNOSIS — R269 Unspecified abnormalities of gait and mobility: Secondary | ICD-10-CM | POA: Diagnosis not present

## 2018-11-27 DIAGNOSIS — G8929 Other chronic pain: Secondary | ICD-10-CM | POA: Diagnosis not present

## 2018-11-27 DIAGNOSIS — R1013 Epigastric pain: Secondary | ICD-10-CM | POA: Diagnosis not present

## 2018-11-27 DIAGNOSIS — R200 Anesthesia of skin: Secondary | ICD-10-CM | POA: Diagnosis not present

## 2018-11-28 ENCOUNTER — Other Ambulatory Visit: Payer: Self-pay | Admitting: Family Medicine

## 2018-11-28 ENCOUNTER — Telehealth: Payer: Self-pay | Admitting: Family Medicine

## 2018-11-28 NOTE — Telephone Encounter (Signed)
Copied from [place] [phone]. Topic: Quick Communication - Rx Refill/Question >> Nov 28, 2018 10:31 AM Rayann Heman wrote: Medication: pt called and stated that she would like Dr Nolon Rod to know that she will not be taking cloNIDine (CATAPRES) 0.1 MG tablet [[id number]] because pt does not like the affects that it has on her body. Pt states that she feels like her left leg has been falling asleep on her and that she is in constant pain. Pt would like a call back regarding. Please advis e

## 2018-11-30 NOTE — Telephone Encounter (Signed)
Please advise 

## 2018-11-30 NOTE — Telephone Encounter (Signed)
FYI

## 2018-12-01 ENCOUNTER — Other Ambulatory Visit: Payer: Self-pay | Admitting: Family Medicine

## 2018-12-01 NOTE — Telephone Encounter (Signed)
Requested medication (s) are due for refill today: Early  Requested medication (s) are on the active medication list: Yes  Last refill:  11/08/18  Future visit scheduled: No  Notes to clinic:  See request    Requested Prescriptions  Pending Prescriptions Disp Refills   cloNIDine (CATAPRES) 0.1 MG tablet [Pharmacy Med Name: CLONIDINE 0.1MG  TAB] 90 tablet     Sig: TAKE 1 TABLET BY MOUTH THREE TIMES A DAY     Cardiovascular:  Alpha-2 Agonists Passed - 12/01/2018  3:16 PM      Passed - Last BP in normal range    BP Readings from Last 1 Encounters:  11/15/18 136/84         Passed - Last Heart Rate in normal range    Pulse Readings from Last 1 Encounters:  11/15/18 91         Passed - Valid encounter within last 6 months    Recent Outpatient Visits          2 weeks ago Malignant hypertension   Primary Care at [facility], Zoe A, MD   6 months ago Chronic diastolic congestive heart failure ([place])   Primary Care at [facility], Arlie Solomons, MD   9 months ago Left-sided weakness   Primary Care at [facility], Arlie Solomons, MD   10 months ago Essential hypertension   Primary Care at [facility], [place] A, MD   11 months ago Acute pain of left shoulder   Primary Care at [facility], Zoe A, MD            oxybutynin (DITROPAN-XL) 5 MG 24 hr tablet [Pharmacy Med Name: OXYBUTYNIN 5MG  ER TAB] 30 tablet 0    Sig: TAKE 1 TABLET BY MOUTH AT BEDTIME     Urology:  Bladder Agents Passed - 12/01/2018  3:16 PM      Passed - Valid encounter within last 12 months    Recent Outpatient Visits          2 weeks ago Malignant hypertension   Primary Care at [facility], Zoe A, MD   6 months ago Chronic diastolic congestive heart failure ([place])   Primary Care at [facility], Arlie Solomons, MD   9 months ago Left-sided weakness   Primary Care at [facility], Arlie Solomons, MD   10 months ago Essential hypertension   Primary Care at [facility], [place] A, MD   11 months ago Acute  pain of left shoulder   Primary Care at [place], MD            ondansetron (ZOFRAN) 4 MG tablet [Pharmacy Med Name: ONDANSETRON 4MG  TAB] 20 tablet 0    Sig: TAKE 1 TABLET BY MOUTH EVERY 8 HOURS FOR UP TO SEVEN DAYS AS NEEDED FOR NAUSEA OR VOMITING     Not Delegated - Gastroenterology: Antiemetics Failed - 12/01/2018  3:16 PM      Failed - This refill cannot be delegated      Passed - Valid encounter within last 6 months    Recent Outpatient Visits          2 weeks ago Malignant hypertension   Primary Care at [facility], [place], MD   6 months ago Chronic diastolic congestive heart failure [facility])   Primary Care at [place], MD   9 months ago Left-sided weakness   Primary Care at [place], MD   10 months ago Essential hypertension  Primary Care at [place], MD   11 months ago Acute pain of left shoulder   Primary Care at [facility], Arlie Solomons, MD

## 2018-12-02 ENCOUNTER — Telehealth: Payer: Self-pay | Admitting: Family Medicine

## 2018-12-02 NOTE — Telephone Encounter (Signed)
Copied from [place] [phone]. Topic: General - Other >> Dec 02, 2018  2:16 PM Nils Flack wrote: Reason for CRM: pt checking on home health orders.  Please advise

## 2018-12-06 ENCOUNTER — Telehealth (INDEPENDENT_AMBULATORY_CARE_PROVIDER_SITE_OTHER): Payer: Medicare HMO | Admitting: Family Medicine

## 2018-12-06 DIAGNOSIS — I10 Essential (primary) hypertension: Secondary | ICD-10-CM | POA: Diagnosis not present

## 2018-12-06 DIAGNOSIS — R42 Dizziness and giddiness: Secondary | ICD-10-CM | POA: Diagnosis not present

## 2018-12-06 DIAGNOSIS — Z9181 History of falling: Secondary | ICD-10-CM | POA: Diagnosis not present

## 2018-12-06 DIAGNOSIS — R200 Anesthesia of skin: Secondary | ICD-10-CM

## 2018-12-06 NOTE — Telephone Encounter (Signed)
Pt has virtual visit this morning and will address home health orders at this time. Dgaddy, CMA

## 2018-12-06 NOTE — Progress Notes (Signed)
Telemedicine Encounter- SOAP NOTE Established Patient  This telephone encounter was conducted with the patient's (or proxy's) verbal consent via audio telecommunications: yes/no: Yes Patient was instructed to have this encounter in a suitably private space; and to only have persons present to whom they give permission to participate. In addition, patient identity was confirmed by use of name plus two identifiers (DOB and address).  I discussed the limitations, risks, security and privacy concerns of performing an evaluation and management service by telephone and the availability of in person appointments. I also discussed with the patient that there may be a patient responsible charge related to this service. The patient expressed understanding and agreed to proceed.  I spent a total of TIME; 0 MIN TO 60 MIN: 20 minutes talking with the patient or their proxy.  No chief complaint on file.   Subjective   Sherry [name] is a 63 y.o. established patient. Telephone visit today for  HPI  Left side weakness Patient is calling because she has left side weakness that is chronic and difficulty with balance  She states that she needs assistance getting her daily tasks done  Essential Hypertension  She takes her bp meds as instructed but wants to know if there are any medications she can stop taking She denies chest pains, palpitations, shortness of breath  Patient Active Problem List   Diagnosis Date Noted  . Cyst of ovary   . Pre-operative cardiovascular examination 06/13/2018  . Dizziness 02/24/2018  . Left-sided weakness 02/24/2018  . Cigarette smoker 12/31/2017  . DOE (dyspnea on exertion) 12/30/2017  . Phlebitis after infusion 10/28/2017  . Hypertensive emergency 10/21/2017  . Acute diastolic CHF (congestive heart failure) ([place]) 10/21/2017  . Microcytic anemia 10/21/2017  . Medication intolerance 09/06/2017  . Neuroleptic-induced tardive dyskinesia 09/06/2017  . Cancer ([place])  09/06/2017  . Chronic low back pain 09/06/2017  . Grief reaction with prolonged bereavement 09/06/2017  . Opioid use disorder, severe, dependence ([place]) 08/27/2017  . Alcohol use disorder, severe, dependence ([place]) 08/27/2017  . Substance induced mood disorder ([place]) 08/13/2017  . AKI (acute kidney injury) ([place])   . MDD (major depressive disorder), recurrent severe, without psychosis ([place])   . Alcohol withdrawal ([place]) 08/09/2017  . Essential hypertension 07/06/2017  . Chronic kidney disease, stage III (moderate) ([place]) 11/17/2016  . Anxiety 08/21/2016  . Hx of bipolar disorder 01/31/2016  . History of breast cancer 02/13/2013  . Morbid obesity due to excess calories ([place]) 02/13/2013  . Hepatitis C virus infection cured after antiviral drug therapy 12/17/2012    Past Medical History:  Diagnosis Date  . Anxiety   . Bipolar and related disorder ([place])   . Breast cancer ([place])   . CHF (congestive heart failure) ([place])   . Chronic back pain   . Chronic kidney disease    "Stage IV" - states due to hypertension  . Hepatitis C   . Hypertension   . Left-sided weakness   . MRSA infection    of breast incision  . Opioid dependence ([place])    Has been treated in [facility] in past    Current Outpatient Medications  Medication Sig Dispense Refill  . amLODipine (NORVASC) 10 MG tablet TAKE 1 TABLET BY MOUTH DAILY 90 tablet 0  . cloNIDine (CATAPRES) 0.1 MG tablet TAKE 1 TABLET BY MOUTH THREE TIMES A DAY 90 tablet 2  . ferrous sulfate 325 (65 FE) MG tablet Take 1 tablet (325 mg total) by mouth daily with breakfast. 90  tablet 3  . gabapentin (NEURONTIN) 800 MG tablet TAKE 1 TABLET BY MOUTH THREE TIMES A DAY 90 tablet 3  . hydrALAZINE (APRESOLINE) 100 MG tablet Take 100 mg by mouth 3 (three) times daily.     . INGREZZA 80 MG CAPS Take 80 mg by mouth daily.    . isosorbide mononitrate (IMDUR) 60 MG 24 hr tablet Take 1 tablet (60 mg total) by mouth daily. 90 tablet 0  . labetalol (NORMODYNE) 100 MG  tablet TAKE 1 TABLET BY MOUTH TWICE A DAY 180 tablet 0  . Multiple Vitamins-Minerals (ADULT GUMMY) CHEW Chew 1 tablet by mouth daily.    . ondansetron (ZOFRAN) 4 MG tablet TAKE 1 TABLET BY MOUTH EVERY 8 HOURS FOR UP TO SEVEN DAYS AS NEEDED FOR NAUSEA OR VOMITING 20 tablet 0  . oxybutynin (DITROPAN-XL) 5 MG 24 hr tablet TAKE 1 TABLET BY MOUTH AT BEDTIME 30 tablet 0  . pantoprazole (PROTONIX) 20 MG tablet Take 1 tablet (20 mg total) by mouth 2 (two) times daily. 180 tablet 3  . terazosin (HYTRIN) 2 MG capsule Take 1 capsule (2 mg total) by mouth at bedtime. 90 capsule 1  . varenicline (CHANTIX STARTING MONTH PAK) 0.5 MG X 11 & 1 MG X 42 tablet Take one 0.5 mg tablet by mouth once daily for 3 days, then increase to one 0.5 mg tablet twice daily for 4 days, then increase to one 1 mg tablet twice daily. 53 tablet 0  . FLUoxetine (PROZAC) 40 MG capsule 1 capsule daily.     No current facility-administered medications for this visit.     Allergies  Allergen Reactions  . Abilify [Aripiprazole] Other (See Comments)    Tardive dyskinesia Oral  . Remeron [Mirtazapine] Other (See Comments)    Wgt stimulation /gain, Dizziness, Patient says "can tolerate"  . Trazodone And Nefazodone Other (See Comments)    Nightmares/sleep diturbance  . Flexeril [Cyclobenzaprine] Other (See Comments)    Pt states Flexeril makes her feel depressed   . Amoxicillin Diarrhea and Other (See Comments)    NOTE the patient has had PCN WITHOUT reaction Has patient had a PCN reaction causing immediate rash, facial/tongue/throat swelling, SOB or lightheadedness with hypotension: No Has patient had a PCN reaction causing severe rash involving mucus membranes or skin necrosis: No Has patient had a PCN reaction that required hospitalization: No Has patient had a PCN reaction occurring within the last 10 years: No If all of the above answers are "NO", then may proceed with Cephalosporin use.     Social History   Socioeconomic  History  . Marital status: Single    Spouse name: Not on file  . Number of children: 0  . Years of education: 43  . Highest education level: High school graduate  Occupational History  . Occupation: Disabled  Social Needs  . Financial resource strain: Not on file  . Food insecurity    Worry: Not on file    Inability: Not on file  . Transportation needs    Medical: Not on file    Non-medical: Not on file  Tobacco Use  . Smoking status: Current Every Day Smoker    Packs/day: 1.00    Types: Cigarettes  . Smokeless tobacco: Never Used  Substance and Sexual Activity  . Alcohol use: No  . Drug use: Not Currently    Comment: h/o IVD use  . Sexual activity: Not Currently  Lifestyle  . Physical activity    Days per week: Not  on file    Minutes per session: Not on file  . Stress: Not on file  Relationships  . Social Herbalist on phone: Not on file    Gets together: Not on file    Attends religious service: Not on file    Active member of club or organization: Not on file    Attends meetings of clubs or organizations: Not on file    Relationship status: Not on file  . Intimate partner violence    Fear of current or ex partner: Not on file    Emotionally abused: Not on file    Physically abused: Not on file    Forced sexual activity: Not on file  Other Topics Concern  . Not on file  Social History Narrative   Lives at home alone.   Right-handed.   Caffeine use: 4 cups coffee/soda    ROS Review of Systems  Constitutional: Negative for activity change, appetite change, chills and fever.  HENT: Negative for congestion, nosebleeds, trouble swallowing and voice change.   Respiratory: Negative for cough, shortness of breath and wheezing.   Gastrointestinal: Negative for diarrhea, nausea and vomiting.  Genitourinary: Negative for difficulty urinating, dysuria, flank pain and hematuria.  Musculoskeletal: Negative for back pain, joint swelling and neck pain.   Neurological: Negative for dizziness, speech difficulty, light-headedness and numbness.  See HPI. All other review of systems negative.   Objective   Vitals as reported by the patient: There were no vitals filed for this visit.  Diagnoses and all orders for this visit:  At high risk for falls Dizziness Left sided numbness- pt was unable to get to her appointment with Neurology due to hospitalization as well as due Coronavirus  -     Ambulatory referral to [place] -     Ambulatory referral to Neurology  Essential hypertension-  Discussed that it took that many classes of bp medications to achieve bp control and prevent a stroke She was advised to continue her current meds and to follow up with Cardiology as instructed     I discussed the assessment and treatment plan with the patient. The patient was provided an opportunity to ask questions and all were answered. The patient agreed with the plan and demonstrated an understanding of the instructions.   The patient was advised to call back or seek an in-person evaluation if the symptoms worsen or if the condition fails to improve as anticipated.  I provided 20 minutes of non-face-to-face time during this encounter.  Forrest Moron, MD  Primary Care at [facility]

## 2018-12-06 NOTE — Patient Instructions (Signed)
° ° ° °  If you have lab work done today you will be contacted with your lab results within the next 2 weeks.  If you have not heard from us then please contact us. The fastest way to get your results is to register for My Chart. ° ° °IF you received an x-ray today, you will receive an invoice from [facility]. Please contact [facility] at [phone] with questions or concerns regarding your invoice.  ° °IF you received labwork today, you will receive an invoice from [facility]. Please contact [facility] at [phone] with questions or concerns regarding your invoice.  ° °Our billing staff will not be able to assist you with questions regarding bills from these companies. ° °You will be contacted with the lab results as soon as they are available. The fastest way to get your results is to activate your My Chart account. Instructions are located on the last page of this paperwork. If you have not heard from us regarding the results in 2 weeks, please contact this office. °  ° ° ° °

## 2018-12-06 NOTE — Progress Notes (Signed)
CC: Pt wants to discuss reducing the number of bp meds she is taking- ? Prescribing combo bp meds.  Pt has concerns about paperwork for home health, per pt [facility] is waiting to receive paperwork back.  Also pt wants to discuss gabapentin as she says the 600 mg tablet is not helping and is now using the 800 mg to see how it will work.  No recent bp or weight check.  No travel outside the [place] or [place] in the past 3 weeks.

## 2018-12-07 ENCOUNTER — Telehealth: Payer: Self-pay | Admitting: Family Medicine

## 2018-12-07 DIAGNOSIS — Z9889 Other specified postprocedural states: Secondary | ICD-10-CM | POA: Diagnosis not present

## 2018-12-07 DIAGNOSIS — K439 Ventral hernia without obstruction or gangrene: Secondary | ICD-10-CM

## 2018-12-07 DIAGNOSIS — N183 Chronic kidney disease, stage 3 unspecified: Secondary | ICD-10-CM

## 2018-12-07 DIAGNOSIS — R6889 Other general symptoms and signs: Secondary | ICD-10-CM | POA: Diagnosis not present

## 2018-12-07 DIAGNOSIS — Z8719 Personal history of other diseases of the digestive system: Secondary | ICD-10-CM | POA: Diagnosis not present

## 2018-12-07 NOTE — Telephone Encounter (Signed)
She saw her Hernia provider today and he stated that she needs a referral for a nephrologist and an upper gi provider   Please adv

## 2018-12-08 NOTE — Telephone Encounter (Signed)
Please advise 

## 2018-12-09 NOTE — Telephone Encounter (Signed)
Pt called to get the status for home health orders and wanted to give the fax number for [facility]. Fax# [phone]/ please advise

## 2018-12-12 ENCOUNTER — Telehealth: Payer: Self-pay | Admitting: Family Medicine

## 2018-12-12 NOTE — Telephone Encounter (Signed)
Spoke to [place] with [facility] and she is going to look into this and have someone call me back 12/12/2018

## 2018-12-12 NOTE — Telephone Encounter (Signed)
Spoke to [employer] at [facility] about [facility] Orders are needed for skilled nursing

## 2018-12-13 NOTE — Telephone Encounter (Signed)
This patient was referred to Dr. Posey Pronto in 10/2017 at [facility]. Please set her up for another referral.  I will refer her to the GI at [facility] for hernia

## 2018-12-13 NOTE — Telephone Encounter (Signed)
Spoke with provider and she advised me that she will call Mrs. Fry to follow-up.

## 2018-12-14 NOTE — Telephone Encounter (Signed)
Patient is calling in checking on status for orders. Call back is [phone].

## 2018-12-16 ENCOUNTER — Encounter: Payer: Self-pay | Admitting: Gastroenterology

## 2018-12-16 ENCOUNTER — Other Ambulatory Visit: Payer: Self-pay

## 2018-12-16 ENCOUNTER — Telehealth (INDEPENDENT_AMBULATORY_CARE_PROVIDER_SITE_OTHER): Payer: Medicare HMO | Admitting: Gastroenterology

## 2018-12-16 VITALS — Ht 62.0 in | Wt 245.0 lb

## 2018-12-16 DIAGNOSIS — R1010 Upper abdominal pain, unspecified: Secondary | ICD-10-CM

## 2018-12-16 DIAGNOSIS — K31A0 Gastric intestinal metaplasia, unspecified: Secondary | ICD-10-CM

## 2018-12-16 DIAGNOSIS — R1013 Epigastric pain: Secondary | ICD-10-CM

## 2018-12-16 DIAGNOSIS — K3189 Other diseases of stomach and duodenum: Secondary | ICD-10-CM

## 2018-12-16 DIAGNOSIS — Z8719 Personal history of other diseases of the digestive system: Secondary | ICD-10-CM

## 2018-12-16 DIAGNOSIS — K219 Gastro-esophageal reflux disease without esophagitis: Secondary | ICD-10-CM

## 2018-12-16 DIAGNOSIS — R110 Nausea: Secondary | ICD-10-CM

## 2018-12-16 DIAGNOSIS — Z8619 Personal history of other infectious and parasitic diseases: Secondary | ICD-10-CM

## 2018-12-16 DIAGNOSIS — I5031 Acute diastolic (congestive) heart failure: Secondary | ICD-10-CM

## 2018-12-16 NOTE — Patient Instructions (Addendum)
If you are age 63 or older, your body mass index should be between 23-30. Your Body mass index is 44.81 kg/m. If this is out of the aforementioned range listed, please consider follow up with your Primary Care Provider.  If you are age 28 or younger, your body mass index should be between 19-25. Your Body mass index is 44.81 kg/m. If this is out of the aformentioned range listed, please consider follow up with your Primary Care Provider.   You have been scheduled for an endoscopy. Please follow written instructions given to you at your visit today. If you use inhalers (even only as needed), please bring them with you on the day of your procedure. Your physician has requested that you go to www.startemmi.com and enter the access code given to you at your visit today. This web site gives a general overview about your procedure. However, you should still follow specific instructions given to you by our office regarding your preparation for the procedure.  Your provider has requested that you go to the basement level for lab work at our [place] location ([address] [place] [address]) . Press "B" on the elevator. The lab is located at the first door on the left as you exit the elevator. You may go at whatever time is convienent for you. The current hours of operations are Monday- Friday 7:30am-4:30pm.  Please call our office at [phone] to set up your 6 month follow up visit.  It was a pleasure to see you today!  Vito Cirigliano, D.O.

## 2018-12-16 NOTE — Progress Notes (Signed)
Chief Complaint: Upper abdominal/epigastric pain, GERD, gastritis  Referring Provider:     Forrest Moron, MD  HPI:    Due to current restrictions/limitations of in-office visits due to the COVID-19 pandemic, this scheduled clinical appointment was converted to a telehealth consultation via telephone.  -The patient did consent to this telephone visit and is aware of possible charges through their insurance for this visit.  -Names of all parties present: Sherry [name] (patient), Gerrit Heck, DO, [facility] (physician)  Interactive audio and video telecommunications were attempted between this provider and patient, however failed, due to patient having technical difficulties OR patient did not have access to video capability. We continued and completed visit with audio only.   Sherry [name] is a 63 y.o. female with a history of morbid obesity, CHF, HCV (s/p treatment, hypertension, narcotic abuse, CKD 3, right breast cancer, tobacco use, GERD, gastritis, referred to the Gastroenterology Clinic for evaluation of upper abdominal pain.  Laparoscopic ventral hernia repair with mesh in 04/2018 at [facility] after presenting in ER with port site and umbilical hernia with incarcerated fat and small segment of transverse colon incarcerated in the defect.  Had an uncomplicated postop course (slight ileus) and had been doing well until 10/2018, noticing a small knot at the epigastrium.  Endorses mild nausea without vomiting.  Seen in the ER on 5/11, with unremarkable CT and labs.  Discharged home with Protonix-she reports no improvement with PPI  Was again seen in the ER in 11/2018 with unremarkable evaluation (including MRI brain for slurred speech), and discharged home.  Was seen by her PCM on 5/26 and referred to GI. Was seen in the [facility] on 5/27.  No recurrent hernia noted on exam or CT and recommended referral to GI for evaluation for gastritis, PUD, etc.  Previously seen by both GI at  [facility] and [facility] in 2018.  Has had multiple endoscopies, as outlined below.  Unfortunately, only path report available for review.  Today she states she has intermittent left side "numbness" from left calf through left shoulder. Occurs at random and can affect her balance, and has falls x3 w/ sxs. Eval unrevealing previously. States she continues to have intermittent upper/epigastric abdominal pain.  Has had similar symptoms in the past, dating back to 2018, but worsening over the last couple of months. Last episode a few days ago. Lasts a few days at a time. Some mild nausea and no emesis.  Not associated with food types.  No change in bowel habits, hematochezia, melena.  Taking Protonix 40 mg daily, which controls reflux well.  No improvement with trial of high-dose PPI.  History of iron deficiency in 2017.  Has been taking oral iron supplement since then.  Best I can tell, iron panel is not been repeated.  Had VCE in 05/2017 at [facility], with report unavailable.  Endoscopic history: -Esophagram (04/2018): Normal.  Patient could not swallow barium tablet - VCE (05/2017): No report available for review - EGD (02/2017): Only path available for review.  Normal duodenum, gastritis and intestinal metaplasia from pylorus, acute and chronic inflammation at GE junction c/w reflux esophagitis, without Barrett's - EGD (07/2016, [facility]): Mild gastritis, negative H. pylori -Colonoscopy (07/2016, [facility]): Normal per report, but 3 to 4 mm TA x2 in path report - Colonoscopy (09/2015, [facility]): Poor prep, diverticulosis.  Recommended repeat in 1 year -Colonoscopy (04/2012, [facility]): 7 mm TA  Past medical history, past  surgical history, social history, family history, medications, and allergies reviewed in the chart and with patient over the phone.  Past Medical History:  Diagnosis Date  . Anxiety   . Bipolar and related disorder ([place])   . Breast cancer ([place])   . CHF (congestive heart failure) ([place])   . Chronic back pain    . Chronic kidney disease    "Stage IV" - states due to hypertension  . Hepatitis C   . Hypertension   . Left-sided weakness   . MRSA infection    of breast incision  . Opioid dependence ([place])    Has been treated in [facility] in past     Past Surgical History:  Procedure Laterality Date  . BACK SURGERY  1990  . BREAST SURGERY     "breast cancer survivor" - states partial mastectomy and nodes  . COLONOSCOPY     [facility]  . ESOPHAGOGASTRODUODENOSCOPY     [facility]  . LAPAROSCOPIC CHOLECYSTECTOMY  2002  . LAPAROSCOPIC INCISIONAL / UMBILICAL / VENTRAL HERNIA REPAIR  04/13/2018   with BARD 15x 50YD mesh (supraumbilical)  . LAPAROSCOPIC LYSIS OF ADHESIONS  07/12/2018   Procedure: LAPAROSCOPIC LYSIS OF ADHESIONS;  Surgeon: Isabel Caprice, MD;  Location: [facility];  Service: Gynecology;;  . MYOMECTOMY     x 2 prior to hysterectomy  . ROBOTIC ASSISTED BILATERAL SALPINGO OOPHERECTOMY Right 07/12/2018   Procedure: XI ROBOTIC ASSISTED RIGHT SALPINGO OOPHORECTOMY;  Surgeon: Isabel Caprice, MD;  Location: [facility];  Service: Gynecology;  Laterality: Right;  . TOTAL ABDOMINAL HYSTERECTOMY     fibroids    Family History  Problem Relation Age of Onset  . Heart attack Mother   . Breast cancer Mother 44  . Dementia Mother   . Cancer Father 8       died of bleeding from kidneys  . Colon cancer Neg Hx   . Esophageal cancer Neg Hx    Social History   Tobacco Use  . Smoking status: Current Every Day Smoker    Packs/day: 1.00    Types: Cigarettes  . Smokeless tobacco: Never Used  Substance Use Topics  . Alcohol use: No  . Drug use: Not Currently    Comment: h/o IVD use   Current Outpatient Medications  Medication Sig Dispense Refill  . amLODipine (NORVASC) 10 MG tablet TAKE 1 TABLET BY MOUTH DAILY 90 tablet 0  . cloNIDine (CATAPRES) 0.1 MG tablet TAKE 1 TABLET BY MOUTH THREE TIMES A DAY 90 tablet 2  . ferrous sulfate 325 (65 FE) MG tablet Take 1 tablet  (325 mg total) by mouth daily with breakfast. 90 tablet 3  . FLUoxetine (PROZAC) 40 MG capsule 1 capsule daily.    Marland Kitchen gabapentin (NEURONTIN) 800 MG tablet TAKE 1 TABLET BY MOUTH THREE TIMES A DAY 90 tablet 3  . hydrALAZINE (APRESOLINE) 100 MG tablet Take 100 mg by mouth 3 (three) times daily.     . INGREZZA 80 MG CAPS Take 80 mg by mouth daily.    . isosorbide mononitrate (IMDUR) 60 MG 24 hr tablet Take 1 tablet (60 mg total) by mouth daily. 90 tablet 0  . labetalol (NORMODYNE) 100 MG tablet TAKE 1 TABLET BY MOUTH TWICE A DAY 180 tablet 0  . Multiple Vitamins-Minerals (ADULT GUMMY) CHEW Chew 1 tablet by mouth daily.    . ondansetron (ZOFRAN) 4 MG tablet TAKE 1 TABLET BY MOUTH EVERY 8 HOURS FOR UP TO SEVEN DAYS AS NEEDED  FOR NAUSEA OR VOMITING 20 tablet 0  . oxybutynin (DITROPAN-XL) 5 MG 24 hr tablet TAKE 1 TABLET BY MOUTH AT BEDTIME 30 tablet 0  . pantoprazole (PROTONIX) 20 MG tablet Take 1 tablet (20 mg total) by mouth 2 (two) times daily. 180 tablet 3  . terazosin (HYTRIN) 2 MG capsule Take 1 capsule (2 mg total) by mouth at bedtime. 90 capsule 1  . varenicline (CHANTIX STARTING MONTH PAK) 0.5 MG X 11 & 1 MG X 42 tablet Take one 0.5 mg tablet by mouth once daily for 3 days, then increase to one 0.5 mg tablet twice daily for 4 days, then increase to one 1 mg tablet twice daily. 53 tablet 0   No current facility-administered medications for this visit.    Allergies  Allergen Reactions  . Abilify [Aripiprazole] Other (See Comments)    Tardive dyskinesia Oral  . Remeron [Mirtazapine] Other (See Comments)    Wgt stimulation /gain, Dizziness, Patient says "can tolerate"  . Trazodone And Nefazodone Other (See Comments)    Nightmares/sleep diturbance  . Flexeril [Cyclobenzaprine] Other (See Comments)    Pt states Flexeril makes her feel depressed   . Amoxicillin Diarrhea and Other (See Comments)    NOTE the patient has had PCN WITHOUT reaction Has patient had a PCN reaction causing immediate  rash, facial/tongue/throat swelling, SOB or lightheadedness with hypotension: No Has patient had a PCN reaction causing severe rash involving mucus membranes or skin necrosis: No Has patient had a PCN reaction that required hospitalization: No Has patient had a PCN reaction occurring within the last 10 years: No If all of the above answers are "NO", then may proceed with Cephalosporin use.      Review of Systems: All systems reviewed and negative except where noted in HPI.     Physical Exam:    Physical exam not completed due to the nature of this telehealth communication.  Patient was otherwise alert and oriented and well communicative.   ASSESSMENT AND PLAN;   1) MEG/upper abdominal pain 2) History of gastritis 3) History of gastric intestinal metaplasia 4) GERD 5) Nausea without emesis  63 year old female with multiple comorbidities and a longstanding history of abdominal complaints, with prior extensive endoscopic evaluation.  Symptoms had abated, now recurred in the last couple of months and increasing in frequency and intensity.  High suspicion for gastritis and possible PUD.  Prior biopsies with intestinal metaplasia in 2018.  GERD otherwise well controlled on PPI therapy.  Plan the following:  -Repeat EGD to evaluate for PUD - Perform GIM mapping biopsies at time of EGD - No improvement with high-dose PPI, so back down to daily therapy to control reflux - Can trial course of peppermint or FD guard for possible superimposed nonulcer dyspepsia -Tolerating p.o. without issue  -Obtain VCE report from [facility] from 05/2017 -Obtain EGD/colonoscopy reports from [facility]  6) History of CHF -Obtain last TTE report from [facility]  7) History of HCV: Has completed antiviral therapy.  SVR established in 2018 labs  8) History of IDA: - Continue iron therapy as prescribed -Obtain records as above - Duodenal biopsies at time of EGD as above -Check iron studies and vitamin D for  co-malabsorption -Holding off on repeat colonoscopy given study as recently as 2018  The indications, risks, and benefits of EGD were explained to the patient in detail. Risks include but are not limited to bleeding, perforation, adverse reaction to medications, and cardiopulmonary compromise. Sequelae include but are not limited to the  possibility of surgery, hositalization, and mortality. The patient verbalized understanding and wished to proceed. All questions answered, referred to scheduler. Further recommendations pending results of the exam.   I spent a total of 45 minutes of time with the patient and reviewing her extensive chart with her. Greater than 50% of the time was spent counseling and coordinating care.     Lavena Bullion, DO, FACG  12/16/2018, 2:34 PM   Forrest Moron, MD

## 2018-12-23 ENCOUNTER — Other Ambulatory Visit: Payer: Self-pay | Admitting: Family Medicine

## 2018-12-23 NOTE — Telephone Encounter (Signed)
Requested medication (s) are due for refill today: some  Requested medication (s) are on the active medication list: yes  Last refill: Oxybutynin 12/01/18 #30 0refills                  Imdur 10/17/2018 #90 0 refills                  Protonix 12/10/17 expired RX  Future visit scheduled: No  Notes to clinic:      Requested Prescriptions  Pending Prescriptions Disp Refills   oxybutynin (DITROPAN-XL) 5 MG 24 hr tablet [Pharmacy Med Name: OXYBUTYNIN 5MG  ER TAB] 30 tablet 0    Sig: TAKE 1 TABLET BY MOUTH AT BEDTIME     Urology:  Bladder Agents Passed - 12/23/2018  1:23 PM      Passed - Valid encounter within last 12 months    Recent Outpatient Visits          1 month ago Malignant hypertension   Primary Care at [place], [place]   7 months ago Chronic diastolic congestive heart failure ([place])   Primary Care at [place], MD   10 months ago Left-sided weakness   Primary Care at [place], MD   11 months ago Essential hypertension   Primary Care at [facility], [place] A, MD   1 year ago Acute pain of left shoulder   Primary Care at [facility], [place] A, MD              isosorbide mononitrate (IMDUR) 60 MG 24 hr tablet [Pharmacy Med Name: ISOSORB MONO 60MG  ER TAB] 30 tablet     Sig: Take 1 tablet (60 mg total) by mouth daily.     Cardiovascular:  Nitrates Passed - 12/23/2018  1:23 PM      Passed - Last BP in normal range    BP Readings from Last 1 Encounters:  11/15/18 136/84         Passed - Last Heart Rate in normal range    Pulse Readings from Last 1 Encounters:  11/15/18 91         Passed - Valid encounter within last 12 months    Recent Outpatient Visits          1 month ago Malignant hypertension   Primary [facility] at [place], [place]   7 months ago Chronic diastolic congestive heart failure [facility])   Primary Care at [place], MD   10 months ago Left-sided weakness   Primary Care at [facility],  [place], MD   11 months ago Essential hypertension   Primary Care at [facility] [place], MD   1 year ago Acute pain of left shoulder   Primary Care at [facility], [place], [place]              pantoprazole (PROTONIX) 20 MG tablet [Pharmacy Med Name: PANTOPRAZOLE 20MG  TAB] 60 tablet     Sig: TAKE 1 TABLET BY MOUTH TWICE A DAY     Gastroenterology: Proton Pump Inhibitors Passed - 12/23/2018  1:23 PM      Passed - Valid encounter within last 12 months    Recent Outpatient Visits          1 month ago Malignant hypertension   Primary Care at [facility] [place], MD   7 months ago Chronic diastolic congestive heart failure [facility])   Primary Care at [place]  A, MD   10 months ago Left-sided weakness   Primary Care at [facility], [place], MD   11 months ago Essential hypertension   Primary Care at [facility], [place] Sol[place]ons, MD   1 year ago Acute pain of left shoulder   Primary Care at [facility], [place], [place]

## 2018-12-26 ENCOUNTER — Telehealth: Payer: Self-pay | Admitting: Gastroenterology

## 2018-12-26 ENCOUNTER — Telehealth: Payer: Self-pay | Admitting: *Deleted

## 2018-12-26 NOTE — Telephone Encounter (Signed)
No answer left message for second COVID attempt screening. Sm

## 2018-12-26 NOTE — Telephone Encounter (Signed)
Patient would like the status of her home health paperwork. She said she called them and they said that they have not received it yet. Please call patient back, thanks.

## 2018-12-26 NOTE — Telephone Encounter (Signed)
No answer for first covid screen attempt. Left message. Sm

## 2018-12-26 NOTE — Telephone Encounter (Signed)
Left message on machine to call back  

## 2018-12-26 NOTE — Telephone Encounter (Signed)
Patient will like a call from the nurse regarding her upcoming procedure. She stated she had some questions that she would like for nurse to address.

## 2018-12-27 ENCOUNTER — Telehealth: Payer: Self-pay | Admitting: Family Medicine

## 2018-12-27 ENCOUNTER — Telehealth: Payer: Self-pay | Admitting: Neurology

## 2018-12-27 ENCOUNTER — Other Ambulatory Visit: Payer: Self-pay | Admitting: Family Medicine

## 2018-12-27 ENCOUNTER — Encounter: Payer: Medicare HMO | Admitting: Gastroenterology

## 2018-12-27 NOTE — Telephone Encounter (Signed)
Ms. Toepfer says she spoke with someone at [facility] and they stated that we have to send a DMA 30-51 to a Place called [facility].  Through my conversations with Meagan w/ [name] she never mentioned this to me me she gave me the number to the [facility] where to fax and that has been faxed as well. Ms. Divelbiss gave me another Fax number and I sent all the notes to that number as well [phone].  Please Adv

## 2018-12-27 NOTE — Telephone Encounter (Signed)
Pt gave consent for VV on the phone/ Pt understands that although there may be some limitations with this type of visit, we will take all precautions to reduce any security or privacy concerns.  Pt understands that this will be treated like an in office visit and we will file with pt's insurance, and there may be a patient responsible charge related to this service. Sent email w link to [email or web address]

## 2018-12-27 NOTE — Telephone Encounter (Signed)
Medication is not delegated /

## 2018-12-28 MED ORDER — NICOTINE 21 MG/24HR TD PT24: 21.0000 mg | MEDICATED_PATCH | Freq: Every day | TRANSDERMAL | 0 refills | Status: DC

## 2018-12-28 NOTE — Addendum Note (Signed)
Addended by: Delia Chimes A on: 12/28/2018 12:34 AM   Modules accepted: Orders

## 2018-12-28 NOTE — Telephone Encounter (Signed)
The pt had questions regarding her prep for the EGD.  We discussed at length what she need to do prior to the procedure and arrival time.  The pt has been advised of the information and verbalized understanding.

## 2018-12-30 DIAGNOSIS — R10817 Generalized abdominal tenderness: Secondary | ICD-10-CM | POA: Diagnosis not present

## 2018-12-30 DIAGNOSIS — I160 Hypertensive urgency: Secondary | ICD-10-CM | POA: Diagnosis not present

## 2018-12-30 DIAGNOSIS — I150 Renovascular hypertension: Secondary | ICD-10-CM | POA: Diagnosis not present

## 2018-12-30 DIAGNOSIS — R0789 Other chest pain: Secondary | ICD-10-CM | POA: Diagnosis not present

## 2018-12-30 DIAGNOSIS — R52 Pain, unspecified: Secondary | ICD-10-CM | POA: Diagnosis not present

## 2018-12-30 DIAGNOSIS — I10 Essential (primary) hypertension: Secondary | ICD-10-CM | POA: Diagnosis not present

## 2018-12-30 DIAGNOSIS — N183 Chronic kidney disease, stage 3 (moderate): Secondary | ICD-10-CM | POA: Diagnosis not present

## 2018-12-30 DIAGNOSIS — R109 Unspecified abdominal pain: Secondary | ICD-10-CM | POA: Diagnosis not present

## 2018-12-30 DIAGNOSIS — I517 Cardiomegaly: Secondary | ICD-10-CM | POA: Diagnosis not present

## 2018-12-30 DIAGNOSIS — R1030 Lower abdominal pain, unspecified: Secondary | ICD-10-CM | POA: Diagnosis not present

## 2018-12-30 DIAGNOSIS — Z6841 Body Mass Index (BMI) 40.0 and over, adult: Secondary | ICD-10-CM | POA: Diagnosis not present

## 2018-12-30 DIAGNOSIS — I130 Hypertensive heart and chronic kidney disease with heart failure and stage 1 through stage 4 chronic kidney disease, or unspecified chronic kidney disease: Secondary | ICD-10-CM | POA: Diagnosis not present

## 2018-12-30 DIAGNOSIS — R1084 Generalized abdominal pain: Secondary | ICD-10-CM | POA: Diagnosis not present

## 2018-12-30 DIAGNOSIS — R29810 Facial weakness: Secondary | ICD-10-CM | POA: Diagnosis not present

## 2018-12-30 DIAGNOSIS — G2401 Drug induced subacute dyskinesia: Secondary | ICD-10-CM | POA: Diagnosis not present

## 2018-12-30 DIAGNOSIS — G8929 Other chronic pain: Secondary | ICD-10-CM | POA: Diagnosis not present

## 2018-12-30 DIAGNOSIS — T788XXA Other adverse effects, not elsewhere classified, initial encounter: Secondary | ICD-10-CM | POA: Diagnosis not present

## 2018-12-30 DIAGNOSIS — R112 Nausea with vomiting, unspecified: Secondary | ICD-10-CM | POA: Diagnosis not present

## 2018-12-30 DIAGNOSIS — R1010 Upper abdominal pain, unspecified: Secondary | ICD-10-CM | POA: Diagnosis not present

## 2018-12-30 DIAGNOSIS — R531 Weakness: Secondary | ICD-10-CM | POA: Diagnosis not present

## 2018-12-31 DIAGNOSIS — N183 Chronic kidney disease, stage 3 (moderate): Secondary | ICD-10-CM | POA: Diagnosis not present

## 2018-12-31 DIAGNOSIS — R109 Unspecified abdominal pain: Secondary | ICD-10-CM | POA: Diagnosis not present

## 2018-12-31 DIAGNOSIS — G8929 Other chronic pain: Secondary | ICD-10-CM | POA: Diagnosis not present

## 2018-12-31 DIAGNOSIS — I160 Hypertensive urgency: Secondary | ICD-10-CM | POA: Diagnosis not present

## 2018-12-31 DIAGNOSIS — R1030 Lower abdominal pain, unspecified: Secondary | ICD-10-CM | POA: Diagnosis not present

## 2018-12-31 DIAGNOSIS — I517 Cardiomegaly: Secondary | ICD-10-CM | POA: Diagnosis not present

## 2019-01-02 ENCOUNTER — Ambulatory Visit (INDEPENDENT_AMBULATORY_CARE_PROVIDER_SITE_OTHER): Payer: Medicare HMO | Admitting: Family Medicine

## 2019-01-02 ENCOUNTER — Other Ambulatory Visit: Payer: Self-pay

## 2019-01-02 ENCOUNTER — Encounter: Payer: Self-pay | Admitting: Family Medicine

## 2019-01-02 VITALS — BP 107/62 | HR 63 | Temp 98.2°F | Resp 17 | Ht 62.0 in | Wt 244.0 lb

## 2019-01-02 DIAGNOSIS — R296 Repeated falls: Secondary | ICD-10-CM | POA: Diagnosis not present

## 2019-01-02 DIAGNOSIS — Z299 Encounter for prophylactic measures, unspecified: Secondary | ICD-10-CM

## 2019-01-02 DIAGNOSIS — R6889 Other general symptoms and signs: Secondary | ICD-10-CM | POA: Diagnosis not present

## 2019-01-02 DIAGNOSIS — R269 Unspecified abnormalities of gait and mobility: Secondary | ICD-10-CM | POA: Diagnosis not present

## 2019-01-02 DIAGNOSIS — I10 Essential (primary) hypertension: Secondary | ICD-10-CM

## 2019-01-02 DIAGNOSIS — F17200 Nicotine dependence, unspecified, uncomplicated: Secondary | ICD-10-CM | POA: Diagnosis not present

## 2019-01-02 LAB — GLUCOSE, POCT (MANUAL RESULT ENTRY): POC Glucose: 127 mg/dl — AB (ref 70–99)

## 2019-01-02 MED ORDER — TERAZOSIN HCL 1 MG PO CAPS
2.00 | ORAL_CAPSULE | ORAL | Status: DC
Start: 2018-12-31 — End: 2019-01-02

## 2019-01-02 MED ORDER — POLYETHYLENE GLYCOL 3350 17 G PO PACK
17.00 | PACK | ORAL | Status: DC
Start: ? — End: 2019-01-02

## 2019-01-02 MED ORDER — AMLODIPINE BESYLATE 5 MG PO TABS
10.00 | ORAL_TABLET | ORAL | Status: DC
Start: 2019-01-01 — End: 2019-01-02

## 2019-01-02 MED ORDER — ACETAMINOPHEN 325 MG PO TABS
650.00 | ORAL_TABLET | ORAL | Status: DC
Start: ? — End: 2019-01-02

## 2019-01-02 MED ORDER — ISOSORBIDE MONONITRATE ER 60 MG PO TB24
60.00 | ORAL_TABLET | ORAL | Status: DC
Start: 2019-01-01 — End: 2019-01-02

## 2019-01-02 MED ORDER — ALUMINUM-MAGNESIUM-SIMETHICONE 200-200-20 MG/5ML PO SUSP
30.00 | ORAL | Status: DC
Start: ? — End: 2019-01-02

## 2019-01-02 MED ORDER — FAMOTIDINE 20 MG PO TABS
20.00 | ORAL_TABLET | ORAL | Status: DC
Start: 2019-01-01 — End: 2019-01-02

## 2019-01-02 MED ORDER — FLUOXETINE HCL 20 MG PO CAPS
40.00 | ORAL_CAPSULE | ORAL | Status: DC
Start: 2019-01-01 — End: 2019-01-02

## 2019-01-02 MED ORDER — VARENICLINE TARTRATE 1 MG PO TABS: 1.0000 mg | ORAL_TABLET | Freq: Two times a day (BID) | ORAL | 6 refills | Status: DC

## 2019-01-02 MED ORDER — MORPHINE SULFATE 4 MG/ML IJ SOLN
4.00 | INTRAMUSCULAR | Status: DC
Start: ? — End: 2019-01-02

## 2019-01-02 MED ORDER — OXYBUTYNIN CHLORIDE 5 MG PO TABS
2.50 | ORAL_TABLET | ORAL | Status: DC
Start: 2018-12-31 — End: 2019-01-02

## 2019-01-02 MED ORDER — LABETALOL HCL 100 MG PO TABS
100.00 | ORAL_TABLET | ORAL | Status: DC
Start: 2018-12-31 — End: 2019-01-02

## 2019-01-02 MED ORDER — ENOXAPARIN SODIUM 40 MG/0.4ML ~~LOC~~ SOLN
40.00 | SUBCUTANEOUS | Status: DC
Start: 2019-01-01 — End: 2019-01-02

## 2019-01-02 MED ORDER — HYDRALAZINE HCL 25 MG PO TABS
25.00 | ORAL_TABLET | ORAL | Status: DC
Start: 2018-12-31 — End: 2019-01-02

## 2019-01-02 MED ORDER — HYDRALAZINE HCL 20 MG/ML IJ SOLN
10.00 | INTRAMUSCULAR | Status: DC
Start: ? — End: 2019-01-02

## 2019-01-02 MED ORDER — ONDANSETRON HCL 4 MG/2ML IJ SOLN
4.00 | INTRAMUSCULAR | Status: DC
Start: ? — End: 2019-01-02

## 2019-01-02 MED ORDER — GABAPENTIN 100 MG PO CAPS
100.00 | ORAL_CAPSULE | ORAL | Status: DC
Start: 2018-12-31 — End: 2019-01-02

## 2019-01-02 MED ORDER — CLONIDINE HCL 0.1 MG PO TABS: 0.1000 mg | ORAL_TABLET | Freq: Two times a day (BID) | ORAL | 2 refills | Status: DC

## 2019-01-02 MED ORDER — CLONIDINE HCL 0.1 MG PO TABS
0.10 | ORAL_TABLET | ORAL | Status: DC
Start: 2018-12-31 — End: 2019-01-02

## 2019-01-02 MED ORDER — PANTOPRAZOLE SODIUM 40 MG PO TBEC
40.00 | DELAYED_RELEASE_TABLET | ORAL | Status: DC
Start: 2019-01-01 — End: 2019-01-02

## 2019-01-02 NOTE — Progress Notes (Signed)
Established Patient Office Visit  Subjective:  Patient ID: Sherry [name], female    DOB: [birth year]  Age: 63 y.o. MRN: [id number]  CC:  Chief Complaint  Patient presents with  . health issues    discuss health issues with dr. Nolon Rod  . Depression    score: 39    HPI [name] presents for   This is a patient with multiple medical problems including Bipolar disorder, CKD due to hypertension, left sided weakness, frequent bouts of epigastric pain and frequent falls.  She takes numerous meds for her high blood and kidney disease. She has been referred to Nephrology with Dr. Posey Pronto to evaluate her CKD and her numerous meds which she believes is contributing to her weakness and frequent falls.   BP Readings from Last 3 Encounters:  01/02/19 107/62  11/15/18 136/84  08/29/18 135/72    She completed the starter pack of chantix and would like to continue the medication Chantix and was able to cut down. She smoked about 3-5 cigarettes a day.  She states that she was getting frustrated with the medical system and feels like something is wrong.  She does not know why she fells this way. She bought a pack of cigarettes yesterday.  She smoked about 5 cigarettes yesterday.    She states that she leaks urine when using the ditropan-xl but it is better than it used to be. She states that she gets some dizziness but can't predict when  Wt Readings from Last 3 Encounters:  01/02/19 244 lb (110.7 kg)  12/16/18 245 lb (111.1 kg)  11/15/18 242 lb 12.8 oz (110.1 kg)    Past Medical History:  Diagnosis Date  . Anxiety   . Bipolar and related disorder ([place])   . Breast cancer ([place])   . CHF (congestive heart failure) ([place])   . Chronic back pain   . Chronic kidney disease    "Stage IV" - states due to hypertension  . Hepatitis C   . Hypertension   . Left-sided weakness   . MRSA infection    of breast incision  . Opioid dependence ([place])    Has been treated in [facility] in past    Past Surgical History:  Procedure Laterality Date  . BACK SURGERY  1990  . BREAST SURGERY     "breast cancer survivor" - states partial mastectomy and nodes  . COLONOSCOPY     [facility]  . ESOPHAGOGASTRODUODENOSCOPY     [facility]  . LAPAROSCOPIC CHOLECYSTECTOMY  2002  . LAPAROSCOPIC INCISIONAL / UMBILICAL / VENTRAL HERNIA REPAIR  04/13/2018   with BARD 15x 50KX mesh (supraumbilical)  . LAPAROSCOPIC LYSIS OF ADHESIONS  07/12/2018   Procedure: LAPAROSCOPIC LYSIS OF ADHESIONS;  Surgeon: Isabel Caprice, MD;  Location: [facility];  Service: Gynecology;;  . MYOMECTOMY     x 2 prior to hysterectomy  . ROBOTIC ASSISTED BILATERAL SALPINGO OOPHERECTOMY Right 07/12/2018   Procedure: XI ROBOTIC ASSISTED RIGHT SALPINGO OOPHORECTOMY;  Surgeon: Isabel Caprice, MD;  Location: [facility];  Service: Gynecology;  Laterality: Right;  . TOTAL ABDOMINAL HYSTERECTOMY     fibroids     Family History  Problem Relation Age of Onset  . Heart attack Mother   . Breast cancer Mother 16  . Dementia Mother   . Cancer Father 28       died of bleeding from kidneys  . Colon cancer Neg Hx   . Esophageal cancer Neg Hx  Social History   Socioeconomic History  . Marital status: Single    Spouse name: Not on file  . Number of children: 0  . Years of education: 29  . Highest education level: High school graduate  Occupational History  . Occupation: Disabled  Social Needs  . Financial resource strain: Not on file  . Food insecurity    Worry: Not on file    Inability: Not on file  . Transportation needs    Medical: Not on file    Non-medical: Not on file  Tobacco Use  . Smoking status: Current Every Day Smoker    Packs/day: 1.00    Types: Cigarettes  . Smokeless tobacco: Never Used  Substance and Sexual Activity  . Alcohol use: No  . Drug use: Not Currently    Comment: h/o IVD use  . Sexual activity: Not Currently  Lifestyle  . Physical activity    Days per week: Not on file     Minutes per session: Not on file  . Stress: Not on file  Relationships  . Social Herbalist on phone: Not on file    Gets together: Not on file    Attends religious service: Not on file    Active member of club or organization: Not on file    Attends meetings of clubs or organizations: Not on file    Relationship status: Not on file  . Intimate partner violence    Fear of current or ex partner: Not on file    Emotionally abused: Not on file    Physically abused: Not on file    Forced sexual activity: Not on file  Other Topics Concern  . Not on file  Social History Narrative   Lives at home alone.   Right-handed.   Caffeine use: 4 cups coffee/soda    Outpatient Medications Prior to Visit  Medication Sig Dispense Refill  . amLODipine (NORVASC) 10 MG tablet TAKE 1 TABLET BY MOUTH DAILY 90 tablet 0  . ferrous sulfate 325 (65 FE) MG tablet Take 1 tablet (325 mg total) by mouth daily with breakfast. 90 tablet 3  . FLUoxetine (PROZAC) 40 MG capsule 1 capsule daily.    Marland Kitchen gabapentin (NEURONTIN) 800 MG tablet TAKE 1 TABLET BY MOUTH THREE TIMES A DAY 90 tablet 3  . hydrALAZINE (APRESOLINE) 100 MG tablet Take 100 mg by mouth 3 (three) times daily.     . INGREZZA 80 MG CAPS Take 80 mg by mouth daily.    . isosorbide mononitrate (IMDUR) 60 MG 24 hr tablet TAKE 1 TABLET (60 MG TOTAL) BY MOUTH DAILY. 90 tablet 1  . labetalol (NORMODYNE) 100 MG tablet TAKE 1 TABLET BY MOUTH TWICE A DAY 180 tablet 0  . Multiple Vitamins-Minerals (ADULT GUMMY) CHEW Chew 1 tablet by mouth daily.    . nicotine (NICODERM CQ - DOSED IN MG/24 HOURS) 21 mg/24hr patch Place 1 patch (21 mg total) onto the skin daily. 28 patch 0  . ondansetron (ZOFRAN) 4 MG tablet TAKE 1 TABLET BY MOUTH EVERY 8 HOURS FOR UP TO 7 DAYS AS NEEDED FOR NAUSEA & VOMITING 20 tablet 0  . oxybutynin (DITROPAN-XL) 5 MG 24 hr tablet TAKE 1 TABLET BY MOUTH AT BEDTIME 90 tablet 1  . pantoprazole (PROTONIX) 20 MG tablet TAKE 1 TABLET BY  MOUTH TWICE A DAY 180 tablet 1  . terazosin (HYTRIN) 2 MG capsule Take 1 capsule (2 mg total) by mouth at bedtime. 90 capsule 1  .  cloNIDine (CATAPRES) 0.1 MG tablet TAKE 1 TABLET BY MOUTH THREE TIMES A DAY 90 tablet 2  . varenicline (CHANTIX STARTING MONTH PAK) 0.5 MG X 11 & 1 MG X 42 tablet Take one 0.5 mg tablet by mouth once daily for 3 days, then increase to one 0.5 mg tablet twice daily for 4 days, then increase to one 1 mg tablet twice daily. 53 tablet 0   No facility-administered medications prior to visit.     Allergies  Allergen Reactions  . Abilify [Aripiprazole] Other (See Comments)    Tardive dyskinesia Oral  . Remeron [Mirtazapine] Other (See Comments)    Wgt stimulation /gain, Dizziness, Patient says "can tolerate"  . Trazodone And Nefazodone Other (See Comments)    Nightmares/sleep diturbance  . Flexeril [Cyclobenzaprine] Other (See Comments)    Pt states Flexeril makes her feel depressed   . Amoxicillin Diarrhea and Other (See Comments)    NOTE the patient has had PCN WITHOUT reaction Has patient had a PCN reaction causing immediate rash, facial/tongue/throat swelling, SOB or lightheadedness with hypotension: No Has patient had a PCN reaction causing severe rash involving mucus membranes or skin necrosis: No Has patient had a PCN reaction that required hospitalization: No Has patient had a PCN reaction occurring within the last 10 years: No If all of the above answers are "NO", then may proceed with Cephalosporin use.     ROS Review of Systems    Objective:    Physical Exam  BP 107/62 (BP Location: Left Arm, Patient Position: Sitting, Cuff Size: Large)   Pulse 63   Temp 98.2 F (36.8 C) (Oral)   Resp 17   Ht 5\' 2"  (1.575 m)   Wt 244 lb (110.7 kg)   SpO2 97%   BMI 44.63 kg/m  Wt Readings from Last 3 Encounters:  01/02/19 244 lb (110.7 kg)  12/16/18 245 lb (111.1 kg)  11/15/18 242 lb 12.8 oz (110.1 kg)   Physical Exam  Constitutional: Oriented to  person, place, and time. Appears well-developed and well-nourished.  HENT:  Head: Normocephalic and atraumatic.  Eyes: Conjunctivae and EOM are normal.  Cardiovascular: Normal rate, regular rhythm, normal heart sounds and intact distal pulses.  No murmur heard. Pulmonary/Chest: Effort normal and breath sounds normal. No stridor. No respiratory distress. Has no wheezes.  Neurological: Is alert and oriented to person, place, and time.  Skin: Skin is warm. Capillary refill takes less than 2 seconds.  Psychiatric: Has a normal mood and affect. Behavior is normal. Judgment and thought content normal.   While walking with patient to the check out area she began to shake and stumble. It seemed like her feet got tangled as thought she could not move her legs voluntarily.  She struggled to maintain her balance and her upper body seemed to jerk forward.  She stumbled because she was not able to move her legs into a safe stance.  She was walking and communicating the whole time.   Health Maintenance Due  Topic Date Due  . TETANUS/TDAP  11/25/1974    There are no preventive care reminders to display for this patient.  Lab Results  Component Value Date   TSH 0.882 10/21/2017   Lab Results  Component Value Date   WBC 4.6 06/27/2018   HGB 10.3 (L) 06/27/2018   HCT 34.4 (L) 06/27/2018   MCV 93.0 06/27/2018   PLT 272 06/27/2018   Lab Results  Component Value Date   NA 138 11/15/2018   K 5.4 (  H) 11/15/2018   CO2 23 11/15/2018   GLUCOSE 90 11/15/2018   BUN 23 11/15/2018   CREATININE 2.16 (H) 11/15/2018   BILITOT 0.4 06/27/2018   ALKPHOS 101 06/27/2018   AST 12 (L) 06/27/2018   ALT 16 06/27/2018   PROT 7.3 06/27/2018   ALBUMIN 3.8 06/27/2018   CALCIUM 9.2 11/15/2018   ANIONGAP 7 06/27/2018   No results found for: CHOL No results found for: HDL No results found for: LDLCALC No results found for: TRIG No results found for: CHOLHDL Lab Results  Component Value Date   HGBA1C 5.3  09/13/2017      Assessment & Plan:   Problem List Items Addressed This Visit      Cardiovascular and Mediastinum   Essential hypertension  -  Will decrease clonidine dose to 0.1mg  twice a day to decrease fatigue    Relevant Medications   cloNIDine (CATAPRES) 0.1 MG tablet    Other Visit Diagnoses    Need for prophylactic measure    -  Primary   Falls frequently    - discussed fall precautions, reviewed meds and advised stopping Detrol LA Advised home health to assess for safety at home   Relevant Orders   Ambulatory referral to [place]   POCT glucose (manual entry) (Completed)   Abnormality of gait    -  Discussed that her gait is very concerning and that she should follow up with Neurology  Tobacco use disorder - discussed the continuation pack of Chantix    Meds ordered this encounter  Medications  . varenicline (CHANTIX CONTINUING MONTH PAK) 1 MG tablet    Sig: Take 1 tablet (1 mg total) by mouth 2 (two) times daily.    Dispense:  60 tablet    Refill:  6  . cloNIDine (CATAPRES) 0.1 MG tablet    Sig: Take 1 tablet (0.1 mg total) by mouth 2 (two) times daily.    Dispense:  60 tablet    Refill:  2    Maximum Refills Reached    Follow-up: Return in about 3 months (around 04/04/2019) for hypertension and kidney disease.    Forrest Moron, MD

## 2019-01-02 NOTE — Patient Instructions (Addendum)
Decrease your clonidine to two times a day    If you have lab work done today you will be contacted with your lab results within the next 2 weeks.  If you have not heard from [place] then please contact us. The fastest way to get your results is to register for My Chart.   IF you received an x-ray today, you will receive an invoice from [facility]. Please contact [facility] at [phone] with questions or concerns regarding your invoice.   IF you received labwork today, you will receive an invoice from [facility]. Please contact [facility] at [phone] with questions or concerns regarding your invoice.   Our billing staff will not be able to assist you with questions regarding bills from these companies.  You will be contacted with the lab results as soon as they are available. The fastest way to get your results is to activate your My Chart account. Instructions are located on the last page of this paperwork. If you have not heard from [place] regarding the results in 2 weeks, please contact this office.

## 2019-01-03 ENCOUNTER — Telehealth: Payer: Self-pay | Admitting: Gastroenterology

## 2019-01-03 ENCOUNTER — Telehealth: Payer: Self-pay | Admitting: Family Medicine

## 2019-01-03 DIAGNOSIS — R6889 Other general symptoms and signs: Secondary | ICD-10-CM | POA: Diagnosis not present

## 2019-01-03 DIAGNOSIS — N184 Chronic kidney disease, stage 4 (severe): Secondary | ICD-10-CM | POA: Diagnosis not present

## 2019-01-03 DIAGNOSIS — N189 Chronic kidney disease, unspecified: Secondary | ICD-10-CM | POA: Diagnosis not present

## 2019-01-03 DIAGNOSIS — I129 Hypertensive chronic kidney disease with stage 1 through stage 4 chronic kidney disease, or unspecified chronic kidney disease: Secondary | ICD-10-CM | POA: Diagnosis not present

## 2019-01-03 DIAGNOSIS — E559 Vitamin D deficiency, unspecified: Secondary | ICD-10-CM | POA: Diagnosis not present

## 2019-01-03 DIAGNOSIS — Z8619 Personal history of other infectious and parasitic diseases: Secondary | ICD-10-CM | POA: Diagnosis not present

## 2019-01-03 DIAGNOSIS — D631 Anemia in chronic kidney disease: Secondary | ICD-10-CM | POA: Diagnosis not present

## 2019-01-03 NOTE — Telephone Encounter (Signed)
Spoke w/patient regarding Covid-19 Screening Questions:  Do you now or have you had a fever in the last 14 days? NO Do you have any respiratory symptoms of shortness of breath or cough now or in the last 14 days? NO Do you have any family members or close contacts with diagnosed or suspected Covid-19 in the past 14 days? NO Have you been tested for Covid-19 and found to be positive? NO

## 2019-01-03 NOTE — Telephone Encounter (Signed)
Is it ok to give verbal ok?  Please advise. Dgaddy, CMA

## 2019-01-03 NOTE — Telephone Encounter (Signed)
Home Health Verbal Orders - Caller/Agency: [facility] Callback Number: [phone] [place] Requesting OT/PT/Skilled Nursing/Social Work/Speech Therapy: PT evaluation Frequency: Mon June 29 Physical therapist needs a verbal ok from doctor. Call back

## 2019-01-04 ENCOUNTER — Encounter: Payer: Medicare HMO | Admitting: Gastroenterology

## 2019-01-04 ENCOUNTER — Encounter: Payer: Self-pay | Admitting: Gastroenterology

## 2019-01-05 ENCOUNTER — Other Ambulatory Visit: Payer: Self-pay

## 2019-01-05 ENCOUNTER — Encounter: Payer: Self-pay | Admitting: Neurology

## 2019-01-05 ENCOUNTER — Ambulatory Visit (INDEPENDENT_AMBULATORY_CARE_PROVIDER_SITE_OTHER): Payer: Medicare HMO | Admitting: Neurology

## 2019-01-05 ENCOUNTER — Telehealth: Payer: Self-pay | Admitting: Neurology

## 2019-01-05 DIAGNOSIS — M5442 Lumbago with sciatica, left side: Secondary | ICD-10-CM | POA: Diagnosis not present

## 2019-01-05 DIAGNOSIS — R531 Weakness: Secondary | ICD-10-CM

## 2019-01-05 DIAGNOSIS — M5441 Lumbago with sciatica, right side: Secondary | ICD-10-CM | POA: Diagnosis not present

## 2019-01-05 DIAGNOSIS — M6281 Muscle weakness (generalized): Secondary | ICD-10-CM

## 2019-01-05 HISTORY — DX: Lumbago with sciatica, right side: M54.42

## 2019-01-05 HISTORY — DX: Lumbago with sciatica, right side: M54.41

## 2019-01-05 MED ORDER — ALPRAZOLAM 1 MG PO TABS: ORAL_TABLET | ORAL | 0 refills | Status: DC

## 2019-01-05 NOTE — Telephone Encounter (Signed)
Mcarthur Rossetti Josem Kaufmann: [phone] (exp. 01/05/19 to 02/04/19) order sent to GI. They will reach out to the patient to schedule.

## 2019-01-05 NOTE — Progress Notes (Signed)
PATIENT: Sherry [name] DOB: [birth year]  No chief complaint on file.    HISTORICAL  Sherry [name] is a 63 years old female, seen in request by Dr. Nolon Rod, Gwendolyn Fill for evaluation of left-sided weakness, numbness, dizziness spells, initial evaluation was on February 24, 2018.  She has a long history of bipolar disorder, was treated with Prozac in the past, also on Abilify, and other neuroleptic medications, she has developed tardive dyskinesia more than 10 years ago, is not taking any mood stabilizer any more.  She also has history of obesity, hypertension, she lives alone, brought in by transportation today.  She is poor historian.  I reviewed her hospital discharge summary on October 24, 2017, she was admitted for complains of chest tightness, pain, severe hypertension 213/99,  require aggressive treatment, nitroglycerin drip, and oral antihypertensive medications, she was discharged with multiple anti-hypertensive agents, labetalol 300 mg twice a day, hydrochlorothiazide 50 mg daily, amlodipine 10 mg daily, hydralazine 25 mg every 8 hours.  Even before hospital admission in April 2019, especially since her hospital admission, major adjustment of her hypertensive medication, she began to have spells of dizziness, she describes it most to happen when she got up from sitting down position, she felt woozy headed, going to fall, especially her left leg going to give out underneath her, sometimes was associated left arm weakness, if she can sit down for 10 minutes, her symptoms would gradually recover, she never had total loss of consciousness.  Today sitting down blood pressure 156/76 heart rate of 64, standing up 139/77 heart rate of 71  Personally reviewed CT head in April 2019, no acute abnormality, mild supratentorium small vessel disease.  Laboratory evaluations on Dec 01, 2017, creatinine was 1.86, GFR was 33, Hemoglobin of 10.3, hematocrit was 24, normal TSH, U93, folic acid, ferritin level was  decreased 8,  Echocardiogram in April 2019: Ejection fraction 65 to 70%, wall motion was normal  Virtual Visit via Video  I connected with [name] on 01/05/19 at  by Video and verified that I am speaking with the correct person using two identifiers.   I discussed the limitations, risks, security and privacy concerns of performing an evaluation and management service by video and the availability of in person appointments. I also discussed with the patient that there may be a patient responsible charge related to this service. The patient expressed understanding and agreed to proceed.   History of Present Illness: I saw her in 2019, ordered MRI of brain, she did not have it.  She continue complains of left side difficulty, dragging her left leg while ambulating, in addition, she complains gradual worsening low back pain, radiating pain to bilateral lower extremity, gait abnormality, she denied persistent paresthesia of bilateral lower extremity, denies bowel and bladder incontinence     Observations/Objective: I have reviewed problem lists, medications, allergies. Obese, awake, alert, oriented to history taking, and casual conversation, facial were symmetric,  while ambulating, she tends to hold her left arm in elbow flexion, dragging her left leg across the floor  Assessment and Plan: Left side weakness, Worsening low back pain, radiating pain to bilateral lower extremity  MRI of brain, to rule out right hemisphere pathology  MRI of lumbar to rule out lumbar radiculopathy  Xanax as needed before MRI   Follow Up Instructions:  2 months    I discussed the assessment and treatment plan with the patient. The patient was provided an opportunity to ask questions and all were answered. The  patient agreed with the plan and demonstrated an understanding of the instructions.   The patient was advised to call back or seek an in-person evaluation if the symptoms worsen or if the condition  fails to improve as anticipated.  I provided 30 minutes of non-face-to-face time during this encounter.   Marcial Pacas, MD  REVIEW OF SYSTEMS: Full 14 system review of systems performed and notable only for weight gain, fatigue, snoring, joint pain, headache, numbness, weakness, sleepiness, restless leg, aching muscles, increased thirst.  ALLERGIES: Allergies  Allergen Reactions  . Abilify [Aripiprazole] Other (See Comments)    Tardive dyskinesia Oral  . Remeron [Mirtazapine] Other (See Comments)    Wgt stimulation /gain, Dizziness, Patient says "can tolerate"  . Trazodone And Nefazodone Other (See Comments)    Nightmares/sleep diturbance  . Flexeril [Cyclobenzaprine] Other (See Comments)    Pt states Flexeril makes her feel depressed   . Amoxicillin Diarrhea and Other (See Comments)    NOTE the patient has had PCN WITHOUT reaction Has patient had a PCN reaction causing immediate rash, facial/tongue/throat swelling, SOB or lightheadedness with hypotension: No Has patient had a PCN reaction causing severe rash involving mucus membranes or skin necrosis: No Has patient had a PCN reaction that required hospitalization: No Has patient had a PCN reaction occurring within the last 10 years: No If all of the above answers are "NO", then may proceed with Cephalosporin use.     HOME MEDICATIONS: Current Outpatient Medications  Medication Sig Dispense Refill  . amLODipine (NORVASC) 10 MG tablet TAKE 1 TABLET BY MOUTH DAILY 90 tablet 0  . cloNIDine (CATAPRES) 0.1 MG tablet Take 1 tablet (0.1 mg total) by mouth 2 (two) times daily. 60 tablet 2  . ferrous sulfate 325 (65 FE) MG tablet Take 1 tablet (325 mg total) by mouth daily with breakfast. 90 tablet 3  . FLUoxetine (PROZAC) 40 MG capsule 1 capsule daily.    Marland Kitchen gabapentin (NEURONTIN) 800 MG tablet TAKE 1 TABLET BY MOUTH THREE TIMES A DAY 90 tablet 3  . hydrALAZINE (APRESOLINE) 100 MG tablet Take 100 mg by mouth 3 (three) times daily.     .  INGREZZA 80 MG CAPS Take 80 mg by mouth daily.    . isosorbide mononitrate (IMDUR) 60 MG 24 hr tablet TAKE 1 TABLET (60 MG TOTAL) BY MOUTH DAILY. 90 tablet 1  . labetalol (NORMODYNE) 100 MG tablet TAKE 1 TABLET BY MOUTH TWICE A DAY 180 tablet 0  . Multiple Vitamins-Minerals (ADULT GUMMY) CHEW Chew 1 tablet by mouth daily.    . nicotine (NICODERM CQ - DOSED IN MG/24 HOURS) 21 mg/24hr patch Place 1 patch (21 mg total) onto the skin daily. 28 patch 0  . ondansetron (ZOFRAN) 4 MG tablet TAKE 1 TABLET BY MOUTH EVERY 8 HOURS FOR UP TO 7 DAYS AS NEEDED FOR NAUSEA & VOMITING 20 tablet 0  . oxybutynin (DITROPAN-XL) 5 MG 24 hr tablet TAKE 1 TABLET BY MOUTH AT BEDTIME 90 tablet 1  . pantoprazole (PROTONIX) 20 MG tablet TAKE 1 TABLET BY MOUTH TWICE A DAY 180 tablet 1  . terazosin (HYTRIN) 2 MG capsule Take 1 capsule (2 mg total) by mouth at bedtime. 90 capsule 1  . varenicline (CHANTIX CONTINUING MONTH PAK) 1 MG tablet Take 1 tablet (1 mg total) by mouth 2 (two) times daily. 60 tablet 6   No current facility-administered medications for this visit.     PAST MEDICAL HISTORY: Past Medical History:  Diagnosis Date  .  Anxiety   . Bipolar and related disorder ([place])   . Breast cancer ([place])   . CHF (congestive heart failure) ([place])   . Chronic back pain   . Chronic kidney disease    "Stage IV" - states due to hypertension  . Hepatitis C   . Hypertension   . Left-sided weakness   . MRSA infection    of breast incision  . Opioid dependence ([place])    Has been treated in [facility] in past    PAST SURGICAL HISTORY: Past Surgical History:  Procedure Laterality Date  . BACK SURGERY  1990  . BREAST SURGERY     "breast cancer survivor" - states partial mastectomy and nodes  . COLONOSCOPY     [facility]  . ESOPHAGOGASTRODUODENOSCOPY     [facility]  . LAPAROSCOPIC CHOLECYSTECTOMY  2002  . LAPAROSCOPIC INCISIONAL / UMBILICAL / VENTRAL HERNIA REPAIR  04/13/2018   with BARD 15x 20cm  mesh (supraumbilical)  . LAPAROSCOPIC LYSIS OF ADHESIONS  07/12/2018   Procedure: LAPAROSCOPIC LYSIS OF ADHESIONS;  Surgeon: Isabel Caprice, MD;  Location: [facility];  Service: Gynecology;;  . MYOMECTOMY     x 2 prior to hysterectomy  . ROBOTIC ASSISTED BILATERAL SALPINGO OOPHERECTOMY Right 07/12/2018   Procedure: XI ROBOTIC ASSISTED RIGHT SALPINGO OOPHORECTOMY;  Surgeon: Isabel Caprice, MD;  Location: [facility];  Service: Gynecology;  Laterality: Right;  . TOTAL ABDOMINAL HYSTERECTOMY     fibroids     FAMILY HISTORY: Family History  Problem Relation Age of Onset  . Heart attack Mother   . Breast cancer Mother 60  . Dementia Mother   . Cancer Father 22       died of bleeding from kidneys  . Colon cancer Neg Hx   . Esophageal cancer Neg Hx     SOCIAL HISTORY: Social History   Socioeconomic History  . Marital status: Single    Spouse name: Not on file  . Number of children: 0  . Years of education: 44  . Highest education level: High school graduate  Occupational History  . Occupation: Disabled  Social Needs  . Financial resource strain: Not on file  . Food insecurity    Worry: Not on file    Inability: Not on file  . Transportation needs    Medical: Not on file    Non-medical: Not on file  Tobacco Use  . Smoking status: Current Every Day Smoker    Packs/day: 1.00    Types: Cigarettes  . Smokeless tobacco: Never Used  Substance and Sexual Activity  . Alcohol use: No  . Drug use: Not Currently    Comment: h/o IVD use  . Sexual activity: Not Currently  Lifestyle  . Physical activity    Days per week: Not on file    Minutes per session: Not on file  . Stress: Not on file  Relationships  . Social Herbalist on phone: Not on file    Gets together: Not on file    Attends religious service: Not on file    Active member of club or organization: Not on file    Attends meetings of clubs or organizations: Not on file    Relationship status: Not on file  .  Intimate partner violence    Fear of current or ex partner: Not on file    Emotionally abused: Not on file    Physically abused: Not on file    Forced sexual  activity: Not on file  Other Topics Concern  . Not on file  Social History Narrative   Lives at home alone.   Right-handed.   Caffeine use: 4 cups coffee/soda     PHYSICAL EXAM   There were no vitals filed for this visit.  Not recorded      There is no height or weight on file to calculate BMI.  PHYSICAL EXAMNIATION:  Gen: NAD, conversant, well nourised, obese, well groomed                     Cardiovascular: Regular rate rhythm, no peripheral edema, warm, nontender. Eyes: Conjunctivae clear without exudates or hemorrhage Neck: Supple, no carotid bruits. Pulmonary: Clear to auscultation bilaterally   NEUROLOGICAL EXAM: Frequent, protrusion, mouth chewing movement,  MENTAL STATUS: Speech:    Speech is normal; fluent and spontaneous with normal comprehension.  Cognition:     Orientation to time, place and person     Normal recent and remote memory     Normal Attention span and concentration     Normal Language, naming, repeating,spontaneous speech     Fund of knowledge   CRANIAL NERVES: CN II: Visual fields are full to confrontation. Fundoscopic exam is normal with sharp discs and no vascular changes. Pupils are round equal and briskly reactive to light. CN III, IV, VI: extraocular movement are normal. No ptosis. CN V: Facial sensation is intact to pinprick in all 3 divisions bilaterally. Corneal responses are intact.  CN VII: Face is symmetric with normal eye closure and smile. CN VIII: Hearing is normal to rubbing fingers CN IX, X: Palate elevates symmetrically. Phonation is normal. CN XI: Head turning and shoulder shrug are intact CN XII: Tongue is midline with normal movements and no atrophy.  MOTOR: There is no pronator drift of out-stretched arms. Muscle bulk and tone are normal. Muscle strength is  normal.  REFLEXES: Reflexes are 2+ and symmetric at the biceps, triceps, knees, and ankles. Plantar responses are flexor.  SENSORY: Intact to light touch, pinprick, positional sensation and vibratory sensation are intact in fingers and toes.  COORDINATION: Rapid alternating movements and fine finger movements are intact. There is no dysmetria on finger-to-nose and heel-knee-shin.    GAIT/STANCE: She needs pushed up to get up from seated position, mildly unsteady, also limited by her big body habitus   DIAGNOSTIC DATA (LABS, IMAGING, TESTING) - I reviewed patient records, labs, notes, testing and imaging myself where available.   ASSESSMENT AND PLAN  Kierra Jezewski is a 63 y.o. female   Dizziness, transient left-sided weakness  This could due to hypoperfusion of the brain especially in the right hemisphere,  She has multiple vascular risk factors, including obesity, hypertension, deconditioning,  Proceed with MRI of brain  Ultrasound of carotid artery  Continue aspirin 81 mg daily  Keep well hydration   Marcial Pacas, M.D. Ph.D.  [facility] [address] [place], [place] [address] Ph: [phone] Fax: [phone]  CC:  Forrest Moron, MD

## 2019-01-06 NOTE — Telephone Encounter (Signed)
FYI: I talked with Mr. Sherry [name] today and gave verbal per [facility]

## 2019-01-09 ENCOUNTER — Telehealth: Payer: Self-pay | Admitting: Family Medicine

## 2019-01-09 NOTE — Telephone Encounter (Signed)
Almira PT with [facility] called in to request VO to work with pt  tomorrow per pt request.    CB: [phone]- Okay to LVM

## 2019-01-10 DIAGNOSIS — I5031 Acute diastolic (congestive) heart failure: Secondary | ICD-10-CM | POA: Diagnosis not present

## 2019-01-10 DIAGNOSIS — M5442 Lumbago with sciatica, left side: Secondary | ICD-10-CM | POA: Diagnosis not present

## 2019-01-10 DIAGNOSIS — I129 Hypertensive chronic kidney disease with stage 1 through stage 4 chronic kidney disease, or unspecified chronic kidney disease: Secondary | ICD-10-CM | POA: Diagnosis not present

## 2019-01-10 DIAGNOSIS — I10 Essential (primary) hypertension: Secondary | ICD-10-CM | POA: Diagnosis not present

## 2019-01-10 DIAGNOSIS — R531 Weakness: Secondary | ICD-10-CM | POA: Diagnosis not present

## 2019-01-10 NOTE — Telephone Encounter (Signed)
Almira calling back about verbal for PT 2x a wk for 3wks starting next week.  Constance Haw would like to add on a home health aid to asssist with bathing, and OT therapy for upper body functioining and self care.   Please fax order for rollator with seat to [facility]. Please include patient height and weight on the script.

## 2019-01-16 ENCOUNTER — Telehealth: Payer: Self-pay | Admitting: Family Medicine

## 2019-01-16 DIAGNOSIS — I10 Essential (primary) hypertension: Secondary | ICD-10-CM | POA: Diagnosis not present

## 2019-01-16 DIAGNOSIS — M5442 Lumbago with sciatica, left side: Secondary | ICD-10-CM | POA: Diagnosis not present

## 2019-01-16 DIAGNOSIS — I129 Hypertensive chronic kidney disease with stage 1 through stage 4 chronic kidney disease, or unspecified chronic kidney disease: Secondary | ICD-10-CM | POA: Diagnosis not present

## 2019-01-16 DIAGNOSIS — I5031 Acute diastolic (congestive) heart failure: Secondary | ICD-10-CM | POA: Diagnosis not present

## 2019-01-16 DIAGNOSIS — R531 Weakness: Secondary | ICD-10-CM | POA: Diagnosis not present

## 2019-01-16 NOTE — Telephone Encounter (Signed)
Copied from [place] [id number]. Topic: General - Other >> Jan 16, 2019  1:01 PM Leward Quan A wrote: Reason for CRM: Dominica Severin PT assistant called to say that patient reported a pain level of 7 left upper and lower side and lower back, also her weight is 233 a 6lb difference between the perimeter of 239lbs and 249lbs if there is any information that the Dr need to pass along call Lance Muss at Ph# [phone]

## 2019-01-17 ENCOUNTER — Telehealth: Payer: Self-pay | Admitting: Gastroenterology

## 2019-01-17 ENCOUNTER — Other Ambulatory Visit: Payer: Self-pay | Admitting: Family Medicine

## 2019-01-17 NOTE — Telephone Encounter (Signed)
Please advise 

## 2019-01-17 NOTE — Telephone Encounter (Signed)

## 2019-01-17 NOTE — Telephone Encounter (Signed)
Pt responded "no" to all screening questions °

## 2019-01-18 ENCOUNTER — Telehealth: Payer: Self-pay | Admitting: Gastroenterology

## 2019-01-18 ENCOUNTER — Encounter: Payer: Medicare HMO | Admitting: Gastroenterology

## 2019-01-18 NOTE — Telephone Encounter (Signed)
Dr. Bryan Lemma 2pm today canceled and rescheduled for next week. She said that she was feeling stuffed up

## 2019-01-20 ENCOUNTER — Telehealth: Payer: Self-pay | Admitting: Family Medicine

## 2019-01-20 DIAGNOSIS — I129 Hypertensive chronic kidney disease with stage 1 through stage 4 chronic kidney disease, or unspecified chronic kidney disease: Secondary | ICD-10-CM | POA: Diagnosis not present

## 2019-01-20 DIAGNOSIS — I5031 Acute diastolic (congestive) heart failure: Secondary | ICD-10-CM | POA: Diagnosis not present

## 2019-01-20 DIAGNOSIS — R531 Weakness: Secondary | ICD-10-CM | POA: Diagnosis not present

## 2019-01-20 DIAGNOSIS — I10 Essential (primary) hypertension: Secondary | ICD-10-CM | POA: Diagnosis not present

## 2019-01-20 DIAGNOSIS — M5442 Lumbago with sciatica, left side: Secondary | ICD-10-CM | POA: Diagnosis not present

## 2019-01-20 NOTE — Telephone Encounter (Signed)
Sherry [name] calling from [facility] will be faxing orders for [facility]- Trying to get approved.  Please advise. CbMargreta Journey [phone]

## 2019-01-24 ENCOUNTER — Telehealth: Payer: Self-pay | Admitting: Gastroenterology

## 2019-01-24 NOTE — Telephone Encounter (Signed)
Has this fax been received

## 2019-01-24 NOTE — Telephone Encounter (Signed)
Spoke with patient regarding the Covid-19 screening questions. Covid-19 Screening Questions:  Do you now or have you had a fever in the last 14 days? no  Do you have any respiratory symptoms of shortness of breath or cough now or in the last 14 days? no  Do you have any family members or close contacts with diagnosed or suspected Covid-19 in the past 14 days? no  Have you been tested for Covid-19 and found to be positive? no  Pt made aware of that care partner may wait in the car or come up to the lobby during the procedure but will need to provide their own mask.

## 2019-01-25 ENCOUNTER — Encounter: Payer: Self-pay | Admitting: Gastroenterology

## 2019-01-25 ENCOUNTER — Ambulatory Visit (AMBULATORY_SURGERY_CENTER): Payer: Medicare HMO | Admitting: Gastroenterology

## 2019-01-25 ENCOUNTER — Other Ambulatory Visit: Payer: Self-pay

## 2019-01-25 VITALS — BP 170/80 | HR 62 | Temp 98.4°F | Resp 14 | Ht 62.0 in | Wt 245.0 lb

## 2019-01-25 DIAGNOSIS — K2950 Unspecified chronic gastritis without bleeding: Secondary | ICD-10-CM | POA: Diagnosis not present

## 2019-01-25 DIAGNOSIS — K219 Gastro-esophageal reflux disease without esophagitis: Secondary | ICD-10-CM

## 2019-01-25 DIAGNOSIS — K229 Disease of esophagus, unspecified: Secondary | ICD-10-CM

## 2019-01-25 DIAGNOSIS — R110 Nausea: Secondary | ICD-10-CM | POA: Diagnosis not present

## 2019-01-25 DIAGNOSIS — R1013 Epigastric pain: Secondary | ICD-10-CM

## 2019-01-25 DIAGNOSIS — K2970 Gastritis, unspecified, without bleeding: Secondary | ICD-10-CM

## 2019-01-25 DIAGNOSIS — K3189 Other diseases of stomach and duodenum: Secondary | ICD-10-CM

## 2019-01-25 DIAGNOSIS — Z8719 Personal history of other diseases of the digestive system: Secondary | ICD-10-CM

## 2019-01-25 MED ORDER — PANTOPRAZOLE SODIUM 40 MG PO TBEC: 40.0000 mg | DELAYED_RELEASE_TABLET | Freq: Two times a day (BID) | ORAL | 3 refills | Status: DC

## 2019-01-25 MED ORDER — SODIUM CHLORIDE 0.9 % IV SOLN: 500.0000 mL | Freq: Once | INTRAVENOUS | Status: DC

## 2019-01-25 NOTE — Progress Notes (Signed)
To PACU, VSS. Report to [email or web address].tb 

## 2019-01-25 NOTE — Progress Notes (Signed)
Call to Dr Bryan Lemma to clarify Rx for protonix, pt was only taking 20 mg bid, Dr wanted her to increase to 40mg  bid for 8 weeks.  Rx sent, pt aware and verb understanding to pick up Rx today and to take for 8 weeks then reevaluate with Dr Bryan Lemma.

## 2019-01-25 NOTE — Op Note (Signed)
[place] Patient Name: Denai Caba Procedure Date: 01/25/2019 11:34 AM MRN: [id number] Endoscopist: Gerrit Heck , MD Age: 63 Referring MD:  Date of Birth: 03-Jul-1956 Gender: Female Account #: [id number] Procedure:                Upper GI endoscopy Indications:              Epigastric abdominal pain, Upper abdominal pain,                            Iron deficiency anemia, Follow-up of intestinal                            metaplasia diagnosed on EGD at [facility] in 2018 Medicines:                Monitored Anesthesia Care Procedure:                Pre-Anesthesia Assessment:                           - Prior to the procedure, a History and Physical                            was performed, and patient medications and                            allergies were reviewed. The patient's tolerance of                            previous anesthesia was also reviewed. The risks                            and benefits of the procedure and the sedation                            options and risks were discussed with the patient.                            All questions were answered, and informed consent                            was obtained. Prior Anticoagulants: The patient has                            taken no previous anticoagulant or antiplatelet                            agents. ASA Grade Assessment: III - A patient with                            severe systemic disease. After reviewing the risks                            and benefits, the patient was deemed in  satisfactory condition to undergo the procedure.                           After obtaining informed consent, the endoscope was                            passed under direct vision. Throughout the                            procedure, the patient's blood pressure, pulse, and                            oxygen saturations were monitored continuously. The                            Model  [id number]-[id number]) scope was introduced                            through the mouth, and advanced to the second part                            of duodenum. The upper GI endoscopy was                            accomplished without difficulty. The patient                            tolerated the procedure well. Scope In: Scope Out: Findings:                 The Z-line was irregular and was found 38 cm from                            the incisors. Biopsies were taken with a cold                            forceps for histology. Estimated blood loss was                            minimal.                           The upper third of the esophagus and middle third                            of the esophagus were normal.                           Localized moderate inflammation characterized by                            congestion (edema) and erythema was found in the                            gastric antrum. Biopsies were taken with  a cold                            forceps for histology. Estimated blood loss was                            minimal.                           Localized mild inflammation characterized by                            erythema was found on the lesser curvature of the                            gastric body. Biopsies were taken with a cold                            forceps for histology. Estimated blood loss was                            minimal.                           The gastric fundus, greater curvature of the                            gastric body and pylorus were normal. Biopsies were                            taken throughout the stomach with a cold forceps                            for histology per GIM mapping protocol. Estimated                            blood loss was minimal.                           The duodenal bulb, first portion of the duodenum                            and second portion of the duodenum were normal.                             Biopsies for histology were taken with a cold                            forceps for evaluation of celiac disease. Estimated                            blood loss was minimal. Complications:            No immediate complications. Estimated Blood Loss:     Estimated blood loss was minimal. Impression:               -  Z-line irregular, 38 cm from the incisors.                            Biopsied.                           - Normal upper third of esophagus and middle third                            of esophagus.                           - Gastritis. Biopsied.                           - Gastritis. Biopsied.                           - Normal gastric fundus, greater curvature of the                            gastric body and pylorus. Biopsied per GIM mapping                            protocol.                           - Normal duodenal bulb, first portion of the                            duodenum and second portion of the duodenum.                            Biopsied. Recommendation:           - Patient has a contact number available for                            emergencies. The signs and symptoms of potential                            delayed complications were discussed with the                            patient. Return to normal activities tomorrow.                            Written discharge instructions were provided to the                            patient.                           - Resume previous diet.                           - Continue present medications.                           -  Await pathology results.                           - Return to GI clinic PRN.                           - Resume high dose PPI as prescribed for 8 weeks,                            then plan to reduce to Protonix 40 mg daily for                            ongoing control of reflux and gastric prophylaxis. Gerrit Heck, MD 01/25/2019 12:17:42 PM

## 2019-01-25 NOTE — Patient Instructions (Addendum)
Handout given for gastritis.  Change Protonix to 40 mg twice daily for 8 weeks. Pick up Rx.   YOU HAD AN ENDOSCOPIC PROCEDURE TODAY AT [facility]:   Refer to the procedure report that was given to you for any specific questions about what was found during the examination.  If the procedure report does not answer your questions, please call your gastroenterologist to clarify.  If you requested that your care partner not be given the details of your procedure findings, then the procedure report has been included in a sealed envelope for you to review at your convenience later.  YOU SHOULD EXPECT: Some feelings of bloating in the abdomen. Passage of more gas than usual.  Walking can help get rid of the air that was put into your GI tract during the procedure and reduce the bloating. If you had a lower endoscopy (such as a colonoscopy or flexible sigmoidoscopy) you may notice spotting of blood in your stool or on the toilet paper. If you underwent a bowel prep for your procedure, you may not have a normal bowel movement for a few days.  Please Note:  You might notice some irritation and congestion in your nose or some drainage.  This is from the oxygen used during your procedure.  There is no need for concern and it should clear up in a day or so.  SYMPTOMS TO REPORT IMMEDIATELY:   Following upper endoscopy (EGD)  Vomiting of blood or coffee ground material  New chest pain or pain under the shoulder blades  Painful or persistently difficult swallowing  New shortness of breath  Fever of 100F or higher  Black, tarry-looking stools  For urgent or emergent issues, a gastroenterologist can be reached at any hour by calling [phone].   DIET:  We do recommend a small meal at first, but then you may proceed to your regular diet.  Drink plenty of fluids but you should avoid alcoholic beverages for 24 hours.  ACTIVITY:  You should plan to take it easy for the rest of today and  you should NOT DRIVE or use heavy machinery until tomorrow (because of the sedation medicines used during the test).    FOLLOW UP: Our staff will call the number listed on your records 48-72 hours following your procedure to check on you and address any questions or concerns that you may have regarding the information given to you following your procedure. If we do not reach you, we will leave a message.  We will attempt to reach you two times.  During this call, we will ask if you have developed any symptoms of COVID 19. If you develop any symptoms (ie: fever, flu-like symptoms, shortness of breath, cough etc.) before then, please call [phone].  If you test positive for Covid 19 in the 2 weeks post procedure, please call and report this information to [place].    If any biopsies were taken you will be contacted by phone or by letter within the next 1-3 weeks.  Please call us at [phone] if you have not heard about the biopsies in 3 weeks.    SIGNATURES/CONFIDENTIALITY: You and/or your care partner have signed paperwork which will be entered into your electronic medical record.  These signatures attest to the fact that that the information above on your After Visit Summary has been reviewed and is understood.  Full responsibility of the confidentiality of this discharge information lies with you and/or your care-partner.

## 2019-01-25 NOTE — Progress Notes (Signed)
Called to room to assist during endoscopic procedure.  Patient ID and intended procedure confirmed with present staff. Received instructions for my participation in the procedure from the performing physician.  

## 2019-01-26 ENCOUNTER — Ambulatory Visit: Payer: Self-pay | Admitting: Family Medicine

## 2019-01-26 NOTE — Telephone Encounter (Signed)
Sherry [name] with [facility] called back regarding verbal or for PT for pt. Original request was made on 01/10/19 2 weeks ago. Please return call as soon as possible. CB#[phone]

## 2019-01-26 NOTE — Telephone Encounter (Signed)
Pt called in c/o of "speech being slurry and her memory getting bad".   "I'm also falling".    I think this is from the BP medication Dr. Nolon Rod put me on in October.  However she is inconsistent in her time line of things happening.     She has tests lined up by her neurologist for these symptoms at the end of this month per pt. That Dr. Nolon Rod referred her to.   Dr. Krista Blue.    Since these symptoms were not new I did not refer her to the ED.   I let her know I would send a note to Dr. Nolon Rod so when the office opens at 8:00 the note will be there for her.   Pt was agreeable to this plan.  I sent this note to Primary Care at [facility].    Reason for Disposition . [1] Loss of speech or garbled speech AND [2] is a chronic symptom (recurrent or ongoing AND present > 4 weeks)    Has seen Dr. Nolon Rod about this and is waiting to have tests done that the neurologist has ordered.  Answer Assessment - Initial Assessment Questions 1. SYMPTOM: "What is the main symptom you are concerned about?" (e.g., weakness, numbness)     My speech is slurry and my memory is bad and I'm falling.   This started recently. 2. ONSET: "When did this start?" (minutes, hours, days; while sleeping)     A couple of weeks.    I was started on BP medicine back in Oct.    3. LAST NORMAL: "When was the last time you were normal (no symptoms)?"     I'm not sure when it happened.   It seems I've gotten worse since starting the BP medication.    I talked with Dr. Nolon Rod about it and she decreased my dose.   4. PATTERN "Does this come and go, or has it been constant since it started?"  "Is it present now?"     My speech is slurry and I can't get my thoughts out.    No stroke history.   I've fallen 3 times with one time being in Dr. Nolon Rod' office.    She sent me to a neurologist and I'm supposed to have some tests at the end of the month.    5. CARDIAC SYMPTOMS: "Have you had any of the following symptoms: chest pain,  difficulty breathing, palpitations?"     I have CHF. 6. NEUROLOGIC SYMPTOMS: "Have you had any of the following symptoms: headache, dizziness, vision loss, double vision, changes in speech, unsteady on your feet?"     See above 7. OTHER SYMPTOMS: "Do you have any other symptoms?"     No 8. PREGNANCY: "Is there any chance you are pregnant?" "When was your last menstrual period?"     N/A due to age  Protocols used: NEUROLOGIC DEFICIT-A-AH

## 2019-01-27 ENCOUNTER — Telehealth: Payer: Self-pay | Admitting: *Deleted

## 2019-01-27 ENCOUNTER — Encounter: Payer: Self-pay | Admitting: Gastroenterology

## 2019-01-27 DIAGNOSIS — I10 Essential (primary) hypertension: Secondary | ICD-10-CM | POA: Diagnosis not present

## 2019-01-27 DIAGNOSIS — I129 Hypertensive chronic kidney disease with stage 1 through stage 4 chronic kidney disease, or unspecified chronic kidney disease: Secondary | ICD-10-CM | POA: Diagnosis not present

## 2019-01-27 DIAGNOSIS — R531 Weakness: Secondary | ICD-10-CM | POA: Diagnosis not present

## 2019-01-27 DIAGNOSIS — I5031 Acute diastolic (congestive) heart failure: Secondary | ICD-10-CM | POA: Diagnosis not present

## 2019-01-27 DIAGNOSIS — M5442 Lumbago with sciatica, left side: Secondary | ICD-10-CM | POA: Diagnosis not present

## 2019-01-27 NOTE — Telephone Encounter (Signed)
  Follow up Call-  Call back number 01/25/2019  Post procedure Call Back phone  # [phone]  Permission to leave phone message Yes  Some recent data might be hidden     Patient questions:  Do you have a fever, pain , or abdominal swelling? No. Pain Score  0 *  Have you tolerated food without any problems? Yes.    Have you been able to return to your normal activities? Yes.    Do you have any questions about your discharge instructions: Diet   No. Medications  No. Follow up visit  No.  Do you have questions or concerns about your Care? No.  Actions: * If pain score is 4 or above: No action needed, pain <4.  1. Have you developed a fever since your procedure? NO  2.   Have you had an respiratory symptoms (SOB or cough) since your procedure? NO  3.   Have you tested positive for COVID 19 since your procedure NO  4.   Have you had any family members/close contacts diagnosed with the COVID 19 since your procedure?  NO  If yes to any of these questions please route to Joylene John, RN and Alphonsa Gin, RN.

## 2019-01-28 NOTE — Telephone Encounter (Signed)
Yes I found this fax and placed in with the other forms for your completion. Dgaddy, CMA

## 2019-01-29 NOTE — Telephone Encounter (Signed)
Will contact Almira with [facility] when back in office on 01/30/2019. Dgaddy, CMA

## 2019-01-30 ENCOUNTER — Telehealth: Payer: Self-pay | Admitting: Family Medicine

## 2019-01-30 NOTE — Telephone Encounter (Signed)
Almira, from [facility], calling regarding these forms and is requesting an update. Please callback at [phone]

## 2019-01-30 NOTE — Telephone Encounter (Signed)
Patient stated [facility] has sent in paper work and they have not received the signed document back

## 2019-02-01 ENCOUNTER — Telehealth (INDEPENDENT_AMBULATORY_CARE_PROVIDER_SITE_OTHER): Payer: Medicare HMO | Admitting: Family Medicine

## 2019-02-01 ENCOUNTER — Other Ambulatory Visit: Payer: Self-pay | Admitting: Family Medicine

## 2019-02-01 DIAGNOSIS — R296 Repeated falls: Secondary | ICD-10-CM

## 2019-02-01 DIAGNOSIS — R269 Unspecified abnormalities of gait and mobility: Secondary | ICD-10-CM

## 2019-02-01 DIAGNOSIS — R531 Weakness: Secondary | ICD-10-CM | POA: Diagnosis not present

## 2019-02-01 DIAGNOSIS — R42 Dizziness and giddiness: Secondary | ICD-10-CM

## 2019-02-01 DIAGNOSIS — I10 Essential (primary) hypertension: Secondary | ICD-10-CM

## 2019-02-01 NOTE — Telephone Encounter (Signed)
Felicia contacting [place] advising form faxed back.  Copy given to [facility] for safekeeping if copy is needed before scanned into the chart. Dgaddy, CMA

## 2019-02-01 NOTE — Telephone Encounter (Signed)
Form has been faxed to [facility]. They have been ntified

## 2019-02-01 NOTE — Telephone Encounter (Signed)
Dr Currie Paris from [facility] is calling back f/u about a form they faxed

## 2019-02-01 NOTE — Progress Notes (Signed)
Telemedicine Encounter- SOAP NOTE Established Patient  This telephone encounter was conducted with the patient's (or proxy's) verbal consent via audio telecommunications: yes/no: Yes Patient was instructed to have this encounter in a suitably private space; and to only have persons present to whom they give permission to participate. In addition, patient identity was confirmed by use of name plus two identifiers (DOB and address).  I discussed the limitations, risks, security and privacy concerns of performing an evaluation and management service by telephone and the availability of in person appointments. I also discussed with the patient that there may be a patient responsible charge related to this service. The patient expressed understanding and agreed to proceed.  I spent a total of TIME; 0 MIN TO 60 MIN: 25 minutes talking with the patient or their proxy.  CC: frequent falls  Subjective   Sherry [name] is a 63 y.o. established patient. Telephone visit today for  HPI  Left side weakness Frequent falls Patient reports that she has been drifting to the left side She has falling twice today but did not hit her head and denies loss of consciousness She reports that she feels like she is weak on the left and the left side has tremors   She reports that the gabapentin 600mg  is not enough for her pain She has an appointment with Neurology on Monday 02/06/19   Hypertension: Patient here for follow-up of elevated blood pressure. She is not exercising and is adherent to low salt diet.  Blood pressure is well controlled at home. Cardiac symptoms none. Patient denies chest pain, chest pressure/discomfort, claudication and dyspnea.  Cardiovascular risk factors: hypertension.   BP Readings from Last 3 Encounters:  01/25/19 (!) 170/80  01/02/19 107/62  11/15/18 136/84    Patient Active Problem List   Diagnosis Date Noted  . Bilateral low back pain with bilateral sciatica 01/05/2019  .  Cyst of ovary   . Pre-operative cardiovascular examination 06/13/2018  . Dizziness 02/24/2018  . Left-sided weakness 02/24/2018  . Cigarette smoker 12/31/2017  . DOE (dyspnea on exertion) 12/30/2017  . Phlebitis after infusion 10/28/2017  . Hypertensive emergency 10/21/2017  . Acute diastolic CHF (congestive heart failure) ([place]) 10/21/2017  . Microcytic anemia 10/21/2017  . Medication intolerance 09/06/2017  . Neuroleptic-induced tardive dyskinesia 09/06/2017  . Cancer ([place]) 09/06/2017  . Chronic low back pain 09/06/2017  . Grief reaction with prolonged bereavement 09/06/2017  . Opioid use disorder, severe, dependence ([place]) 08/27/2017  . Alcohol use disorder, severe, dependence ([place]) 08/27/2017  . Substance induced mood disorder ([place]) 08/13/2017  . AKI (acute kidney injury) ([place])   . MDD (major depressive disorder), recurrent severe, without psychosis ([place])   . Alcohol withdrawal ([place]) 08/09/2017  . Essential hypertension 07/06/2017  . Chronic kidney disease, stage III (moderate) ([place]) 11/17/2016  . Anxiety 08/21/2016  . Hx of bipolar disorder 01/31/2016  . History of breast cancer 02/13/2013  . Morbid obesity due to excess calories ([place]) 02/13/2013  . Hepatitis C virus infection cured after antiviral drug therapy 12/17/2012    Past Medical History:  Diagnosis Date  . Anxiety   . Bipolar and related disorder ([place])   . Blood transfusion without reported diagnosis   . Breast cancer ([place])    right lumpectemy and lymph node removal  . CHF (congestive heart failure) ([place])   . Chronic back pain   . Chronic kidney disease    "Stage IV" - states due to hypertension  . Depression   . GERD (  gastroesophageal reflux disease)   . Hepatitis C   . Hypertension   . Left-sided weakness   . MRSA infection    of breast incision  . Opioid dependence ([place])    Has been treated in [facility] in past  . Substance abuse [facility])     Current Outpatient Medications  Medication Sig Dispense  Refill  . ALPRAZolam (XANAX) 1 MG tablet Take 1-2 tablets 30 minutes prior to MRI, may repeat once as needed. Must have driver. 4 tablet 0  . amLODipine (NORVASC) 10 MG tablet TAKE 1 TABLET BY MOUTH DAILY 90 tablet 0  . cloNIDine (CATAPRES) 0.1 MG tablet Take 1 tablet (0.1 mg total) by mouth 2 (two) times daily. 60 tablet 2  . ferrous sulfate 325 (65 FE) MG tablet Take 1 tablet (325 mg total) by mouth daily with breakfast. 90 tablet 3  . FLUoxetine (PROZAC) 40 MG capsule 1 capsule daily.    Marland Kitchen gabapentin (NEURONTIN) 800 MG tablet TAKE 1 TABLET BY MOUTH THREE TIMES A DAY 90 tablet 3  . hydrALAZINE (APRESOLINE) 100 MG tablet Take 100 mg by mouth 3 (three) times daily.     . hydrOXYzine (VISTARIL) 25 MG capsule Take 25 mg by mouth 4 (four) times daily as needed for anxiety.    . INGREZZA 80 MG CAPS Take 80 mg by mouth daily.    . isosorbide mononitrate (IMDUR) 60 MG 24 hr tablet TAKE 1 TABLET (60 MG TOTAL) BY MOUTH DAILY. 90 tablet 1  . labetalol (NORMODYNE) 100 MG tablet TAKE 1 TABLET BY MOUTH TWICE A DAY 180 tablet 0  . Multiple Vitamins-Minerals (ADULT GUMMY) CHEW Chew 1 tablet by mouth daily.    . nicotine (NICODERM CQ - DOSED IN MG/24 HOURS) 21 mg/24hr patch Place 1 patch (21 mg total) onto the skin daily. (Patient not taking: Reported on 01/25/2019) 28 patch 0  . ondansetron (ZOFRAN) 4 MG tablet TAKE 1 TABLET BY MOUTH EVERY 8 HOURS UP TO SEVEN DAYS AS NEEDED FOR NAUSEA AND VOMITING 20 tablet 0  . oxybutynin (DITROPAN-XL) 5 MG 24 hr tablet TAKE 1 TABLET BY MOUTH AT BEDTIME 90 tablet 1  . pantoprazole (PROTONIX) 40 MG tablet Take 1 tablet (40 mg total) by mouth 2 (two) times daily. 90 tablet 3  . terazosin (HYTRIN) 2 MG capsule Take 1 capsule (2 mg total) by mouth at bedtime. 90 capsule 1  . varenicline (CHANTIX CONTINUING MONTH PAK) 1 MG tablet Take 1 tablet (1 mg total) by mouth 2 (two) times daily. 60 tablet 6   No current facility-administered medications for this visit.     Allergies   Allergen Reactions  . Abilify [Aripiprazole] Other (See Comments)    Tardive dyskinesia Oral  . Remeron [Mirtazapine] Other (See Comments)    Wgt stimulation /gain, Dizziness, Patient says "can tolerate"  . Trazodone And Nefazodone Other (See Comments)    Nightmares/sleep diturbance  . Flexeril [Cyclobenzaprine] Other (See Comments)    Pt states Flexeril makes her feel depressed   . Amoxicillin Diarrhea and Other (See Comments)    NOTE the patient has had PCN WITHOUT reaction Has patient had a PCN reaction causing immediate rash, facial/tongue/throat swelling, SOB or lightheadedness with hypotension: No Has patient had a PCN reaction causing severe rash involving mucus membranes or skin necrosis: No Has patient had a PCN reaction that required hospitalization: No Has patient had a PCN reaction occurring within the last 10 years: No If all of the above answers are "NO", then  may proceed with Cephalosporin use.     Social History   Socioeconomic History  . Marital status: Single    Spouse name: Not on file  . Number of children: 0  . Years of education: 69  . Highest education level: High school graduate  Occupational History  . Occupation: Disabled  Social Needs  . Financial resource strain: Not on file  . Food insecurity    Worry: Not on file    Inability: Not on file  . Transportation needs    Medical: Not on file    Non-medical: Not on file  Tobacco Use  . Smoking status: Current Every Day Smoker    Packs/day: 0.25    Types: Cigarettes  . Smokeless tobacco: Never Used  . Tobacco comment: 4 cigs per day 01/2019  Substance and Sexual Activity  . Alcohol use: No  . Drug use: Not Currently    Comment: h/o IVD use  . Sexual activity: Not Currently  Lifestyle  . Physical activity    Days per week: Not on file    Minutes per session: Not on file  . Stress: Not on file  Relationships  . Social Herbalist on phone: Not on file    Gets together: Not on file     Attends religious service: Not on file    Active member of club or organization: Not on file    Attends meetings of clubs or organizations: Not on file    Relationship status: Not on file  . Intimate partner violence    Fear of current or ex partner: Not on file    Emotionally abused: Not on file    Physically abused: Not on file    Forced sexual activity: Not on file  Other Topics Concern  . Not on file  Social History Narrative   Lives at home alone.   Right-handed.   Caffeine use: 4 cups coffee/soda    ROS Review of Systems  Constitutional: Negative for activity change, appetite change, chills and fever.  HENT: Negative for congestion, nosebleeds, trouble swallowing and voice change.   Respiratory: Negative for cough, shortness of breath and wheezing.   Gastrointestinal: Negative for diarrhea, nausea and vomiting.  Genitourinary: Negative for difficulty urinating, dysuria, flank pain and hematuria.  Musculoskeletal: Negative for back pain, joint swelling and neck pain.  Neurological: see hpi See HPI. All other review of systems negative.   Objective   Vitals as reported by the patient: There were no vitals filed for this visit.  Diagnoses and all orders for this visit:  Falls frequently  Left-sided weakness  Dizziness  Essential hypertension  Abnormality of gait   Advised pt to continue gabapentin At this time will not add any additional meds as most worsen dizziness She should follow up with Neurology Completed form for home health aide Patient is frustrated by pain but the risks are higher for worsening dizziness in a patient that falls frequently and her history of substance abuse is a problem as well.   Hypertension: Continue her home BP meds for now   I discussed the assessment and treatment plan with the patient. The patient was provided an opportunity to ask questions and all were answered. The patient agreed with the plan and demonstrated an  understanding of the instructions.   The patient was advised to call back or seek an in-person evaluation if the symptoms worsen or if the condition fails to improve as anticipated.  I provided 25  minutes of non-face-to-face time during this encounter.  Forrest Moron, MD  Primary Care at [facility]

## 2019-02-01 NOTE — Telephone Encounter (Signed)
Copied from [place] [id number]. Topic: General - Other >> Jan 31, 2019  2:15 PM Nils Flack, Marland Kitchen wrote: Reason for CRM: christina from [facility] called to see if fax was received.  Please call with status of form. She says it was faxed on 01/12/19 and needs to be faxed back  Cb is [phone] - please cal

## 2019-02-01 NOTE — Telephone Encounter (Signed)
This is being handle in  another msg. Dr Nolon Rod is aware

## 2019-02-02 ENCOUNTER — Telehealth: Payer: Self-pay | Admitting: Family Medicine

## 2019-02-02 ENCOUNTER — Telehealth: Payer: Self-pay

## 2019-02-02 NOTE — Telephone Encounter (Signed)
Copied from [place] [phone]. Topic: General - Other >> Jan 31, 2019  2:15 PM Nils Flack, Marland Kitchen wrote: Reason for CRM: christina from [facility] called to see if fax was received.  Please call with status of form. She says it was faxed on 01/12/19 and needs to be faxed back  Cb is [phone] - please cal

## 2019-02-02 NOTE — Telephone Encounter (Signed)
Spoke with Constance Haw an she advises they will be finishing up OT/PT with pt on 02/03/2019 and no further assistance is needed from [place]. DGaddy, CMA

## 2019-02-03 NOTE — Telephone Encounter (Signed)
Spoke to Sherry [name] at [facility] we tried to fax for but keeps failing. Will mail form

## 2019-02-06 ENCOUNTER — Other Ambulatory Visit: Payer: Medicare HMO

## 2019-02-06 DIAGNOSIS — D3502 Benign neoplasm of left adrenal gland: Secondary | ICD-10-CM | POA: Diagnosis not present

## 2019-02-06 DIAGNOSIS — I499 Cardiac arrhythmia, unspecified: Secondary | ICD-10-CM | POA: Diagnosis not present

## 2019-02-06 DIAGNOSIS — N281 Cyst of kidney, acquired: Secondary | ICD-10-CM | POA: Diagnosis not present

## 2019-02-06 DIAGNOSIS — I700 Atherosclerosis of aorta: Secondary | ICD-10-CM | POA: Diagnosis not present

## 2019-02-06 DIAGNOSIS — M545 Low back pain: Secondary | ICD-10-CM | POA: Diagnosis not present

## 2019-02-06 DIAGNOSIS — I10 Essential (primary) hypertension: Secondary | ICD-10-CM | POA: Diagnosis not present

## 2019-02-06 DIAGNOSIS — R1013 Epigastric pain: Secondary | ICD-10-CM | POA: Diagnosis not present

## 2019-02-06 DIAGNOSIS — R112 Nausea with vomiting, unspecified: Secondary | ICD-10-CM | POA: Diagnosis not present

## 2019-02-06 DIAGNOSIS — R1010 Upper abdominal pain, unspecified: Secondary | ICD-10-CM | POA: Diagnosis not present

## 2019-02-06 DIAGNOSIS — F17210 Nicotine dependence, cigarettes, uncomplicated: Secondary | ICD-10-CM | POA: Diagnosis not present

## 2019-02-06 DIAGNOSIS — D3501 Benign neoplasm of right adrenal gland: Secondary | ICD-10-CM | POA: Diagnosis not present

## 2019-02-06 DIAGNOSIS — R1084 Generalized abdominal pain: Secondary | ICD-10-CM | POA: Diagnosis not present

## 2019-02-06 DIAGNOSIS — R52 Pain, unspecified: Secondary | ICD-10-CM | POA: Diagnosis not present

## 2019-02-07 DIAGNOSIS — I499 Cardiac arrhythmia, unspecified: Secondary | ICD-10-CM | POA: Diagnosis not present

## 2019-02-08 NOTE — Progress Notes (Signed)
Assessment & Plan Upper abdominal pain, etiology unknown Possible gastritis vs. Small gastri or duodenal ulcer.  CT scan negative for hernia recurrence.  Refer to PCP and GI for work-up.  From my standpoint no acute general surgery issues.  Follow -up on an as needed basis.

## 2019-02-10 ENCOUNTER — Other Ambulatory Visit: Payer: Self-pay | Admitting: Family Medicine

## 2019-02-10 ENCOUNTER — Other Ambulatory Visit: Payer: Self-pay | Admitting: *Deleted

## 2019-02-10 MED ORDER — AMLODIPINE BESYLATE 10 MG PO TABS: 10.0000 mg | ORAL_TABLET | Freq: Every day | ORAL | 0 refills | Status: DC

## 2019-02-10 NOTE — Telephone Encounter (Signed)
Requested medications are due for refill today?  Yes  Requested medications are on the active medication list?  Yes  Last refill 01/19/2019  Future visit scheduled?  Yes - 04/05/2019  Notes to clinic   Requested Prescriptions  Pending Prescriptions Disp Refills   ondansetron (ZOFRAN) 4 MG tablet [Pharmacy Med Name: ONDANSETRON 4MG  TAB] 20 tablet 0    Sig: TAKE 1 TABLET BY MOUTH EVERY 8 HOURS UP TO 7 DAYS AS NEEDED FOR NAUSEA & VOMITING     Not Delegated - Gastroenterology: Antiemetics Failed - 02/10/2019 11:44 AM      Failed - This refill cannot be delegated      Passed - Valid encounter within last 6 months    Recent Outpatient Visits          1 month ago Abnormality of gait   Primary Care at [facility], Arlie Solomons, MD   2 months ago Malignant hypertension   Primary Care at [facility], [place] A, MD   9 months ago Chronic diastolic congestive heart failure [facility])   Primary Care at [facility], Arlie Solomons, MD   1 year ago Left-sided weakness   Primary Care at [facility], Arlie Solomons, MD   1 year ago Essential hypertension   Primary Care at [place], MD      Future Appointments            In 1 month Forrest Moron, MD Primary Care at [place], [facility]           Signed Prescriptions Disp Refills   labetalol (NORMODYNE) 100 MG tablet 180 tablet 0    Sig: TAKE 1 TABLET BY MOUTH TWICE A DAY     Cardiovascular:  Beta Blockers Failed - 02/10/2019 11:44 AM      Failed - Last BP in normal range    BP Readings from Last 1 Encounters:  01/25/19 (!) 170/80         Passed - Last Heart Rate in normal range    Pulse Readings from Last 1 Encounters:  01/25/19 62         Passed - Valid encounter within last 6 months    Recent Outpatient Visits          1 month ago Abnormality of gait   Primary Care at [facility], Arlie Solomons, MD   2 months ago Malignant hypertension   Primary Care at [facility], [place] A, MD   9 months ago Chronic diastolic congestive heart  failure [facility])   Primary Care at [facility], Arlie Solomons, MD   1 year ago Left-sided weakness   Primary Care at [facility], Arlie Solomons, MD   1 year ago Essential hypertension   Primary Care at [place], MD      Future Appointments            In 1 month Forrest Moron, MD Primary Care at [place], [facility]            amLODipine (NORVASC) 10 MG tablet 30 tablet 0    Sig: TAKE 1 TABLET BY MOUTH DAILY     Cardiovascular:  Calcium Channel Blockers Failed - 02/10/2019 11:44 AM      Failed - Last BP in normal range    BP Readings from Last 1 Encounters:  01/25/19 (!) 170/80         Passed - Valid encounter within last 6 months    Recent Outpatient Visits  1 month ago Abnormality of gait   Primary Care at [place], MD   2 months ago Malignant hypertension   Primary Care at [facility], [place] A, MD   9 months ago Chronic diastolic congestive heart failure [facility])   Primary Care at [facility], Arlie Solomons, MD   1 year ago Left-sided weakness   Primary Care at [facility], Arlie Solomons, MD   1 year ago Essential hypertension   Primary Care at [facility], Arlie Solomons, MD      Future Appointments            In 1 month Forrest Moron, MD Primary Care at [place], [facility]            gabapentin (NEURONTIN) 800 MG tablet 90 tablet 1    Sig: TAKE 1 TABLET BY [place] A DAY     Neurology: Anticonvulsants - gabapentin Passed - 02/10/2019 11:44 AM      Passed - Valid encounter within last 12 months    Recent Outpatient Visits          1 month ago Abnormality of gait   Primary Care at [facility], Arlie Solomons, MD   2 months ago Malignant hypertension   Primary Care at [facility], [place] A, MD   9 months ago Chronic diastolic congestive heart failure [facility])   Primary Care at [facility], Arlie Solomons, MD   1 year ago Left-sided weakness   Primary Care at [facility], Arlie Solomons, MD   1 year ago Essential hypertension   Primary Care at  [place], MD      Future Appointments            In 1 month Forrest Moron, MD Primary Care at [place], [facility]

## 2019-02-10 NOTE — Telephone Encounter (Signed)
Requested Prescriptions  Pending Prescriptions Disp Refills  . ondansetron (ZOFRAN) 4 MG tablet [Pharmacy Med Name: ONDANSETRON 4MG  TAB] 20 tablet 0    Sig: TAKE 1 TABLET BY MOUTH EVERY 8 HOURS UP TO 7 DAYS AS NEEDED FOR NAUSEA & VOMITING     Not Delegated - Gastroenterology: Antiemetics Failed - 02/10/2019 11:44 AM      Failed - This refill cannot be delegated      Passed - Valid encounter within last 6 months    Recent Outpatient Visits          1 month ago Abnormality of gait   Primary Care at [facility], Arlie Solomons, MD   2 months ago Malignant hypertension   Primary Care at [facility], [place] A, MD   9 months ago Chronic diastolic congestive heart failure [facility])   Primary Care at [facility], Arlie Solomons, MD   1 year ago Left-sided weakness   Primary Care at [facility], Arlie Solomons, MD   1 year ago Essential hypertension   Primary Care at [place], MD      Future Appointments            In 1 month Forrest Moron, MD Primary Care at [place], [place]           . labetalol (NORMODYNE) 100 MG tablet [Pharmacy Med Name: LABETALOL 100MG  TAB] 180 tablet 0    Sig: TAKE 1 TABLET BY MOUTH TWICE A DAY     Cardiovascular:  Beta Blockers Failed - 02/10/2019 11:44 AM      Failed - Last BP in normal range    BP Readings from Last 1 Encounters:  01/25/19 (!) 170/80         Passed - Last Heart Rate in normal range    Pulse Readings from Last 1 Encounters:  01/25/19 62         Passed - Valid encounter within last 6 months    Recent Outpatient Visits          1 month ago Abnormality of gait   Primary Care at [facility], Arlie Solomons, MD   2 months ago Malignant hypertension   Primary Care at [facility], [place] A, MD   9 months ago Chronic diastolic congestive heart failure [facility])   Primary [facility] at [facility], Arlie Solomons, MD   1 year ago Left-sided weakness   Primary Care at [facility], Arlie Solomons, MD   1 year ago Essential hypertension   Primary Care at  [place], MD      Future Appointments            In 1 month Forrest Moron, MD Primary Care at [place], [place]           . amLODipine ([place]) 10 MG tablet [Pharmacy Med Name: AMLODIPINE 10MG  TAB] 30 tablet 0    Sig: TAKE 1 TABLET BY MOUTH DAILY     Cardiovascular:  Calcium Channel Blockers Failed - 02/10/2019 11:44 AM      Failed - Last BP in normal range    BP Readings from Last 1 Encounters:  01/25/19 (!) 170/80         Passed - Valid encounter within last 6 months    Recent Outpatient Visits          1 month ago Abnormality of gait   Primary Care at [place], MD   2 months ago Malignant hypertension  Primary Care at [facility], [place] A, MD   9 months ago Chronic diastolic congestive heart failure [facility])   Primary Care at [facility], Arlie Solomons, MD   1 year ago Left-sided weakness   Primary Care at [facility], Arlie Solomons, MD   1 year ago Essential hypertension   Primary Care at [facility], Arlie Solomons, MD      Future Appointments            In 1 month Forrest Moron, MD Primary Care at [place], [place]           . gabapentin (NEURONTIN) 800 MG tablet [Pharmacy Med Name: GABAPENTIN 800MG  TAB] 90 tablet 1    Sig: TAKE 1 TABLET BY [place]     Neurology: Anticonvulsants - gabapentin Passed - 02/10/2019 11:44 AM      Passed - Valid encounter within last 12 months    Recent Outpatient Visits          1 month ago Abnormality of gait   Primary Care at [facility], Arlie Solomons, MD   2 months ago Malignant hypertension   Primary Care at [facility], [place] A, MD   9 months ago Chronic diastolic congestive heart failure [facility])   Primary Care at [facility], Arlie Solomons, MD   1 year ago Left-sided weakness   Primary Care at [facility], Arlie Solomons, MD   1 year ago Essential hypertension   Primary Care at [place], MD      Future Appointments            In 1 month Forrest Moron, MD Primary Care at  [place], [facility]

## 2019-02-16 NOTE — Telephone Encounter (Signed)
Faxed form back to [facility] at [phone]. Dgaddy, CMA

## 2019-02-16 NOTE — Telephone Encounter (Signed)
[facility] called back stating that they have not received the form yet. They want it to be refaxed to [phone] ASAP.

## 2019-02-24 LAB — HM DIABETES EYE EXAM

## 2019-02-28 ENCOUNTER — Other Ambulatory Visit: Payer: Self-pay | Admitting: Neurology

## 2019-02-28 MED ORDER — ALPRAZOLAM 1 MG PO TABS: ORAL_TABLET | ORAL | 0 refills | Status: DC

## 2019-02-28 NOTE — Telephone Encounter (Signed)
Pt called stating that her medicaid Sherry [name] did not show for her MRI appt she had a couple of weeks ago. But she had already taken the medication that was called in for her to keep her calm during the MRI before they informed her that she would not have a ride. Pt now has r/s MRI and is needing the medication called in for her again. Please advise.

## 2019-02-28 NOTE — Telephone Encounter (Signed)
Medication refill sent to Dr. Krista Blue for approval.

## 2019-03-02 ENCOUNTER — Telehealth: Payer: Self-pay | Admitting: Family Medicine

## 2019-03-02 NOTE — Telephone Encounter (Signed)
Patient would like a phone call today to discuss the medication hydralazine she said they directly affect the kidneys and she has kidney disease.  She wants to see if there is some medications that can be changed to something else   [phone] patient number

## 2019-03-02 NOTE — Telephone Encounter (Signed)
See note

## 2019-03-03 ENCOUNTER — Telehealth: Payer: Self-pay | Admitting: Family Medicine

## 2019-03-03 NOTE — Telephone Encounter (Signed)
Patient called back  Patient states that she has been to the Neurologist and has the MRI She reports that she has a 2 bedroom home. She states that she cannot walk without falling.   She reports that her last blood pressure is not low and is running 150/80. She is getting the home aid 11am to 1pm.  If she gets feeling like she is worsening She states she can't stand up for long periods of time and can support her weight when she falls.   Gave ER precautions.  Scheduling team:   Please call to set up an appointment to discuss the MRI with the patient and a provider while I am away.  Preferably Thursday to allow time for patient to arrange transportation.

## 2019-03-03 NOTE — Telephone Encounter (Signed)
Left voicemail for patient to follow up with her Cardiologist who prescribed the medication. Advised patient to go to Urgent Care regarding her medication.

## 2019-03-03 NOTE — Telephone Encounter (Signed)
Pt experiencing trouble with her medication. She states the meds are making her fall out. Pt called in yesterday and is calling in today for assistance. Please advise

## 2019-03-03 NOTE — Telephone Encounter (Signed)
Pt. Called back attempting to return a call she asserted she received from our practice

## 2019-03-03 NOTE — Telephone Encounter (Signed)
Spoke with pt about appt to discuss MRI.  Still working on appt and pt aware and will call her back with the time.

## 2019-03-06 ENCOUNTER — Other Ambulatory Visit: Payer: Medicare HMO

## 2019-03-06 DIAGNOSIS — R6889 Other general symptoms and signs: Secondary | ICD-10-CM | POA: Diagnosis not present

## 2019-03-06 NOTE — Telephone Encounter (Signed)
Pt schedule for virtual visit with santiago on Friday 03/10/2019. Dgaddy, CMA

## 2019-03-07 ENCOUNTER — Other Ambulatory Visit: Payer: Self-pay | Admitting: Family Medicine

## 2019-03-07 NOTE — Telephone Encounter (Signed)
Requested medication (s) are due for refill today: yes  Requested medication (s) are on the active medication list: yes  Last refill:  02/10/2019 and 6/16/20200  Future visit scheduled: yes  Notes to clinic: Maximum Refills Reached   Requested Prescriptions  Pending Prescriptions Disp Refills   ondansetron (ZOFRAN) 4 MG tablet [Pharmacy Med Name: ONDANSETRON 4MG  TAB] 20 tablet 0    Sig: TAKE 1 TABLET BY MOUTH EVERY 8 HOURS UP TO SEVEN DAYS AS NEEDED FOR NAUSEA & VOMITING     Not Delegated - Gastroenterology: Antiemetics Failed - 03/07/2019 10:39 AM      Failed - This refill cannot be delegated      Passed - Valid encounter within last 6 months    Recent Outpatient Visits          2 months ago Abnormality of gait   Primary Care at [facility], Arlie Solomons, MD   3 months ago Malignant hypertension   Primary Care at [facility], [place] A, MD   9 months ago Chronic diastolic congestive heart failure [facility])   Primary Care at [facility], Arlie Solomons, MD   1 year ago Left-sided weakness   Primary Care at [facility], Arlie Solomons, MD   1 year ago Essential hypertension   Primary Care at [facility] [place], MD      Future Appointments            In 3 days Rutherford Guys, MD Primary Care at [place], [facility]   In 4 weeks Forrest Moron, MD Primary Care at [place], [facility]            isosorbide mononitrate (IMDUR) 60 MG 24 hr tablet [Pharmacy Med Name: ISOSORB MONO 60MG  ER TAB] 30 tablet     Sig: TAKE 1 TABLET BY MOUTH DAILY     Cardiovascular:  Nitrates Failed - 03/07/2019 10:39 AM      Failed - Last BP in normal range    BP Readings from Last 1 Encounters:  01/25/19 (!) 170/80         Passed - Last Heart Rate in normal range    Pulse Readings from Last 1 Encounters:  01/25/19 62         Passed - Valid encounter within last 12 months    Recent Outpatient Visits          2 months ago Abnormality of gait   Primary Care at [facility], Arlie Solomons, MD   3 months ago  Malignant hypertension   Primary Care at [facility], [place] A, MD   9 months ago Chronic diastolic congestive heart failure [facility])   Primary Care at [facility], Arlie Solomons, MD   1 year ago Left-sided weakness   Primary Care at [facility], Arlie Solomons, MD   1 year ago Essential hypertension   Primary Care at [facility], Arlie Solomons, MD      Future Appointments            In 3 days Rutherford Guys, MD Primary Care at [place], [facility]   In 4 weeks Forrest Moron, MD Primary Care at [place], [facility]

## 2019-03-10 ENCOUNTER — Telehealth: Payer: Self-pay | Admitting: Family Medicine

## 2019-03-10 ENCOUNTER — Ambulatory Visit: Payer: Medicare HMO | Admitting: Family Medicine

## 2019-03-12 DIAGNOSIS — I150 Renovascular hypertension: Secondary | ICD-10-CM | POA: Diagnosis not present

## 2019-03-12 DIAGNOSIS — F17210 Nicotine dependence, cigarettes, uncomplicated: Secondary | ICD-10-CM | POA: Diagnosis not present

## 2019-03-12 DIAGNOSIS — R1013 Epigastric pain: Secondary | ICD-10-CM | POA: Diagnosis not present

## 2019-03-12 DIAGNOSIS — M549 Dorsalgia, unspecified: Secondary | ICD-10-CM | POA: Diagnosis not present

## 2019-03-12 DIAGNOSIS — G8929 Other chronic pain: Secondary | ICD-10-CM | POA: Diagnosis not present

## 2019-03-14 ENCOUNTER — Other Ambulatory Visit: Payer: Self-pay

## 2019-03-14 ENCOUNTER — Telehealth: Payer: Self-pay | Admitting: Family Medicine

## 2019-03-14 ENCOUNTER — Ambulatory Visit (INDEPENDENT_AMBULATORY_CARE_PROVIDER_SITE_OTHER): Payer: Medicare HMO | Admitting: Family Medicine

## 2019-03-14 ENCOUNTER — Encounter: Payer: Self-pay | Admitting: Family Medicine

## 2019-03-14 VITALS — BP 148/90 | HR 87 | Temp 98.3°F | Ht 62.0 in | Wt 235.0 lb

## 2019-03-14 DIAGNOSIS — I10 Essential (primary) hypertension: Secondary | ICD-10-CM

## 2019-03-14 DIAGNOSIS — R531 Weakness: Secondary | ICD-10-CM | POA: Diagnosis not present

## 2019-03-14 DIAGNOSIS — N184 Chronic kidney disease, stage 4 (severe): Secondary | ICD-10-CM | POA: Diagnosis not present

## 2019-03-14 DIAGNOSIS — R42 Dizziness and giddiness: Secondary | ICD-10-CM

## 2019-03-14 NOTE — Patient Instructions (Addendum)
   Marcial Pacas MD Address: [address] [place], [place] [address] Phone: [phone]  If you have lab work done today you will be contacted with your lab results within the next 2 weeks.  If you have not heard from [place] then please contact us. The fastest way to get your results is to register for My Chart.   IF you received an x-ray today, you will receive an invoice from [facility]. Please contact [facility] at [phone] with questions or concerns regarding your invoice.   IF you received labwork today, you will receive an invoice from [place]. Please contact [facility] at [phone] with questions or concerns regarding your invoice.   Our billing staff will not be able to assist you with questions regarding bills from these companies.  You will be contacted with the lab results as soon as they are available. The fastest way to get your results is to activate your My Chart account. Instructions are located on the last page of this paperwork. If you have not heard from [place] regarding the results in 2 weeks, please contact this office.

## 2019-03-14 NOTE — Telephone Encounter (Signed)
Pt discussed referral to nutritionist .. missed name of office that would be calling. her . Please advise [phone]

## 2019-03-14 NOTE — Progress Notes (Signed)
9/1/202011:41 AM  Sherry [name] [birth year], 63 y.o., female [id number]  Chief Complaint  Patient presents with  . Fall    constant falls thinks it has something to do with the clonidine she's taking.Says it starts in the left foot, and feels a throbbing up the body.  High bp 200/101 at er visit .This constant fall have been happening since Oct. Wants to talk about the use of all the meds she's on, takes 5 bp meds. Also requested med records for 2nd opinion...being printed    HPI:   Patient is a 63 y.o. female who presents today for frequent falls, confusion/fogginess that have been present since October 2019 after being in the hospital and was started on hydralazine and clonidine  She is wanting to go over her meds as she wonders if she is having side effects  Her cousin is here with her  PCP Dr Nolon Rod - discussed recurrent falls in July via telemedicine, referred back to neurology  Last ER visit 8/30.3030 for abd pain - requested refill of gabapentin 800mg  TID  Saw neurologist, Dr Krista Blue, in June 2020 (telemedicine) for recurrent falls due to intermittent left leg weakness, ordered MRI of brain and lumbar spine  Scheduled for 24th of this month  She sees renal at [facility], last appt in aug 2020  She also sees psychiatry Has TD from Abilify that was prescribed for anxiety Takes ingrezza, not sure if it helps  Wants referral to nutritionist  Lab Results  Component Value Date   CREATININE 2.16 (H) 11/15/2018   CREATININE 2.12 (H) 06/27/2018   CREATININE 2.02 (H) 05/13/2018  last GFR 27  Depression screen [facility] 2/9 03/14/2019 02/01/2019 01/02/2019  Decreased Interest 0 0 2  Down, Depressed, Hopeless 0 0 1  PHQ - 2 Score 0 0 3  Altered sleeping - - 3  Tired, decreased energy - - 2  Change in appetite - - 3  Feeling bad or failure about yourself  - - 2  Trouble concentrating - - 3  Moving slowly or fidgety/restless - - 3  Suicidal thoughts - - 0  PHQ-9  Score - - 19  Difficult doing work/chores - - Extremely dIfficult  Some encounter information is confidential and restricted. Go to Review Flowsheets activity to see all data.  Some recent data might be hidden    Fall Risk  03/14/2019 02/01/2019 01/02/2019 12/06/2018 11/22/2018  Falls in the past year? 1 1 0 1 0  Number falls in past yr: 1 1 0 0 0  Comment - - - - -  Injury with Fall? 1 1 0 0 0  Risk for fall due to : - History of fall(s);Impaired balance/gait - - -  Follow up - Falls evaluation completed;Follow up appointment;Falls prevention discussed Falls evaluation completed Falls evaluation completed Falls evaluation completed     Allergies  Allergen Reactions  . Abilify [Aripiprazole] Other (See Comments)    Tardive dyskinesia Oral  . Remeron [Mirtazapine] Other (See Comments)    Wgt stimulation /gain, Dizziness, Patient says "can tolerate"  . Trazodone And Nefazodone Other (See Comments)    Nightmares/sleep diturbance  . Flexeril [Cyclobenzaprine] Other (See Comments)    Pt states Flexeril makes her feel depressed   . Amoxicillin Diarrhea and Other (See Comments)    NOTE the patient has had PCN WITHOUT reaction Has patient had a PCN reaction causing immediate rash, facial/tongue/throat swelling, SOB or lightheadedness with hypotension: No Has patient had a PCN reaction causing severe  rash involving mucus membranes or skin necrosis: No Has patient had a PCN reaction that required hospitalization: No Has patient had a PCN reaction occurring within the last 10 years: No If all of the above answers are "NO", then may proceed with Cephalosporin use.     Prior to Admission medications   Medication Sig Start Date End Date Taking? Authorizing Provider  amLODipine (NORVASC) 10 MG tablet Take 1 tablet (10 mg total) by mouth daily. 02/10/19  Yes Stallings, Zoe A, MD  cloNIDine (CATAPRES) 0.1 MG tablet Take 1 tablet (0.1 mg total) by mouth 2 (two) times daily. 01/02/19  Yes Forrest Moron, MD  ferrous sulfate 325 (65 FE) MG tablet Take 1 tablet (325 mg total) by mouth daily with breakfast. 10/27/18  Yes Stallings, Zoe A, MD  FLUoxetine (PROZAC) 40 MG capsule 1 capsule daily. 11/09/18  Yes [provider]  gabapentin (NEURONTIN) 800 MG tablet TAKE 1 TABLET BY MOUTH THREE TIMES A DAY 02/10/19  Yes Stallings, Zoe A, MD  hydrALAZINE (APRESOLINE) 100 MG tablet Take 100 mg by mouth 3 (three) times daily.  04/26/18  Yes [provider]  hydrOXYzine (VISTARIL) 25 MG capsule Take 25 mg by mouth 4 (four) times daily as needed for anxiety. 12/26/18  Yes [provider]  INGREZZA 80 MG CAPS Take 80 mg by mouth daily. 10/15/17  Yes [provider]  isosorbide mononitrate (IMDUR) 60 MG 24 hr tablet TAKE 1 TABLET (60 MG TOTAL) BY MOUTH DAILY. 12/27/18  Yes Forrest Moron, MD  Multiple Vitamins-Minerals (ADULT GUMMY) CHEW Chew 1 tablet by mouth daily. 07/21/17  Yes [provider]  nicotine (NICODERM CQ - DOSED IN MG/24 HOURS) 21 mg/24hr patch Place 1 patch (21 mg total) onto the skin daily. 12/28/18  Yes Stallings, Zoe A, MD  ondansetron (ZOFRAN) 4 MG tablet TAKE 1 TABLET BY MOUTH EVERY 8 HOURS UP TO 7 DAYS AS NEEDED FOR NAUSEA & VOMITING 02/10/19  Yes Stallings, Zoe A, MD  oxybutynin (DITROPAN-XL) 5 MG 24 hr tablet TAKE 1 TABLET BY MOUTH AT BEDTIME 12/27/18  Yes Stallings, Zoe A, MD  pantoprazole (PROTONIX) 40 MG tablet Take 1 tablet (40 mg total) by mouth 2 (two) times daily. 01/25/19 03/23/19 Yes Cirigliano, Vito V, DO  terazosin (HYTRIN) 2 MG capsule Take 1 capsule (2 mg total) by mouth at bedtime. 10/27/18  Yes Forrest Moron, MD  varenicline (CHANTIX CONTINUING MONTH PAK) 1 MG tablet Take 1 tablet (1 mg total) by mouth 2 (two) times daily. 01/02/19  Yes Forrest Moron, MD    Past Medical History:  Diagnosis Date  . Anxiety   . Bipolar and related disorder ([place])   . Blood transfusion without reported diagnosis   . Breast cancer ([place])    right  lumpectemy and lymph node removal  . CHF (congestive heart failure) ([place])   . Chronic back pain   . Chronic kidney disease    "Stage IV" - states due to hypertension  . Depression   . GERD (gastroesophageal reflux disease)   . Hepatitis C   . Hypertension   . Left-sided weakness   . MRSA infection    of breast incision  . Opioid dependence ([place])    Has been treated in [facility] in past  . Substance abuse [facility])     Past Surgical History:  Procedure Laterality Date  . BACK SURGERY  1990  . BREAST SURGERY Right 2010   "breast cancer survivor" - states partial  mastectomy and nodes  . COLONOSCOPY     [facility]  . ESOPHAGOGASTRODUODENOSCOPY     [facility]  . LAPAROSCOPIC CHOLECYSTECTOMY  2002  . LAPAROSCOPIC INCISIONAL / UMBILICAL / VENTRAL HERNIA REPAIR  04/13/2018   with BARD 15x 22GU mesh (supraumbilical)  . LAPAROSCOPIC LYSIS OF ADHESIONS  07/12/2018   Procedure: LAPAROSCOPIC LYSIS OF ADHESIONS;  Surgeon: Isabel Caprice, MD;  Location: [facility];  Service: Gynecology;;  . MYOMECTOMY     x 2 prior to hysterectomy  . ROBOTIC ASSISTED BILATERAL SALPINGO OOPHERECTOMY Right 07/12/2018   Procedure: XI ROBOTIC ASSISTED RIGHT SALPINGO OOPHORECTOMY;  Surgeon: Isabel Caprice, MD;  Location: [facility];  Service: Gynecology;  Laterality: Right;  . TOTAL ABDOMINAL HYSTERECTOMY     fibroids   . UPPER GASTROINTESTINAL ENDOSCOPY      Social History   Tobacco Use  . Smoking status: Current Every Day Smoker    Packs/day: 0.25    Types: Cigarettes  . Smokeless tobacco: Never Used  . Tobacco comment: 4 cigs per day 01/2019  Substance Use Topics  . Alcohol use: No    Family History  Problem Relation Age of Onset  . Heart attack Mother   . Breast cancer Mother 76  . Dementia Mother   . Cancer Father 71       died of bleeding from kidneys  . Colon cancer Neg Hx   . Esophageal cancer Neg Hx   . Stomach cancer Neg Hx   . Rectal cancer Neg Hx     Review of  Systems  Constitutional: Negative for chills and fever.  Respiratory: Negative for cough and shortness of breath.   Cardiovascular: Negative for chest pain, palpitations and leg swelling.  Gastrointestinal: Negative for abdominal pain, nausea and vomiting.  per hpi   OBJECTIVE:  Today's Vitals   03/14/19 1121  BP: (!) 148/90  Pulse: 87  Temp: 98.3 F (36.8 C)  SpO2: 98%  Weight: 235 lb (106.6 kg)  Height: 5\' 2"  (1.575 m)   Body mass index is 42.98 kg/m.  BP Readings from Last 3 Encounters:  03/14/19 (!) 148/90  01/25/19 (!) 170/80  01/02/19 107/62    Physical Exam Vitals signs and nursing note reviewed.  Constitutional:      Appearance: She is well-developed.  HENT:     Head: Normocephalic and atraumatic.     Mouth/Throat:     Pharynx: No oropharyngeal exudate.  Eyes:     General: No scleral icterus.    Conjunctiva/sclera: Conjunctivae normal.     Pupils: Pupils are equal, round, and reactive to light.  Neck:     Musculoskeletal: Neck supple.  Cardiovascular:     Rate and Rhythm: Normal rate and regular rhythm.     Heart sounds: Normal heart sounds. No murmur. No friction rub. No gallop.   Pulmonary:     Effort: Pulmonary effort is normal.     Breath sounds: Normal breath sounds. No wheezing or rales.  Skin:    General: Skin is warm and dry.  Neurological:     Mental Status: She is alert and oriented to person, place, and time.     No results found for this or any previous visit (from the past 24 hour(s)).  No results found.   ASSESSMENT and PLAN  1. Essential hypertension Improved compared to previous recent readings. Discussed with patient that her BP meds are not cause of her left sided weakness, and discussed importance of BP control. Could  increase hydralazine if needed.   2. CKD (chronic kidney disease) stage 4, GFR 15-29 ml/min (HCC) Managed by renal, [facility] as recent labs thru them  3. Left-sided weakness Pending MRI brian and lumbar spine  PCP ordered HH  4. Dizziness Discussed importance of BP control Discussed trial off vistaril and then oxybutynin as both might be contributing to current presentation.   Return for as scheduled with PCP, Dr Terrence Dupont.    Rutherford Guys, MD Primary Care at [place], [place] [address] Ph.  [phone] Fax [phone]

## 2019-03-15 NOTE — Telephone Encounter (Signed)
Referral for nutritionist not placed, no record in Epic, will send to sherron and dr Pamella Pert.  Advised pt unsure of what nutrition office referral coordinator will be using as she is out of office today, but whoever she sends referral to will contact her. Pt agreeable.  Referral order needs to be placed.

## 2019-03-16 ENCOUNTER — Telehealth: Payer: Self-pay

## 2019-03-16 NOTE — Telephone Encounter (Signed)
Consent faxed to neurology and [facility] or medical records

## 2019-03-16 NOTE — Telephone Encounter (Signed)
Referral made 

## 2019-04-03 ENCOUNTER — Other Ambulatory Visit: Payer: Self-pay

## 2019-04-03 MED ORDER — ALPRAZOLAM 1 MG PO TABS: ORAL_TABLET | ORAL | 0 refills | Status: DC

## 2019-04-03 NOTE — Telephone Encounter (Signed)
I called patient and verified that her allergies are correct in Epic.  She is aware we will request Xanax, per MRI protocol, to be approved by Dr. Krista Blue.  She verbalized understanding that she must have a driver.

## 2019-04-05 ENCOUNTER — Ambulatory Visit: Payer: Medicare HMO | Admitting: Family Medicine

## 2019-04-06 ENCOUNTER — Ambulatory Visit
Admission: RE | Admit: 2019-04-06 | Discharge: 2019-04-06 | Disposition: A | Discharge status: Home / Self Care | Payer: Medicare HMO | Source: Ambulatory Visit | Attending: Neurology | Admitting: Neurology

## 2019-04-06 ENCOUNTER — Other Ambulatory Visit: Payer: Self-pay

## 2019-04-06 DIAGNOSIS — R6889 Other general symptoms and signs: Secondary | ICD-10-CM | POA: Diagnosis not present

## 2019-04-06 DIAGNOSIS — M6281 Muscle weakness (generalized): Secondary | ICD-10-CM

## 2019-04-06 DIAGNOSIS — M5441 Lumbago with sciatica, right side: Secondary | ICD-10-CM | POA: Diagnosis not present

## 2019-04-06 DIAGNOSIS — R531 Weakness: Secondary | ICD-10-CM

## 2019-04-06 DIAGNOSIS — M5442 Lumbago with sciatica, left side: Secondary | ICD-10-CM | POA: Diagnosis not present

## 2019-04-07 ENCOUNTER — Telehealth: Payer: Self-pay | Admitting: Neurology

## 2019-04-07 DIAGNOSIS — R269 Unspecified abnormalities of gait and mobility: Secondary | ICD-10-CM

## 2019-04-07 DIAGNOSIS — R42 Dizziness and giddiness: Secondary | ICD-10-CM

## 2019-04-07 DIAGNOSIS — R531 Weakness: Secondary | ICD-10-CM

## 2019-04-07 NOTE — Telephone Encounter (Signed)
Pt states she had her MRI on yesterday, pt is asking the results be sent to her PCP once they are available

## 2019-04-10 ENCOUNTER — Telehealth: Payer: Self-pay | Admitting: Neurology

## 2019-04-10 NOTE — Addendum Note (Signed)
Addended by: Noberto Retort C on: 04/10/2019 11:52 AM   Modules accepted: Orders

## 2019-04-10 NOTE — Telephone Encounter (Signed)
Please call patient, MRI lumbar showed mild degenerative changes, no significant canal or foraminal stenosis.  MRI brain showed mild supratentorium small vessel disease.  MRI findings are chronic, no acute abnormalities. If she wishes to come back, give her a follow up.    If she has trouble with left side weakness, may consider refer to physical therapy   IMPRESSION: This MRI of the lumbar spine shows multilevel degenerative changes as detailed above.  The most significant findings are: 1.   At L3-L4 there are degenerative changes causing mild right foraminal narrowing but no nerve root compression or spinal stenosis. 2.   At L4-L5, there are degenerative changes with severe facet hypertrophy causing moderate right lateral recess stenosis and mild foraminal narrowing at left lateral recess stenosis.  There is no nerve root compression or spinal stenosis. 3.   At L5-S1, there are degenerative changes causing mild foraminal narrowing and moderately severe right lateral recess stenosis with potential for right S1 nerve root compression.  There is no spinal stenosis.  IMPRESSION: This MRI of the brain without contrast shows the following: 1.   T2/flair hyperintense foci in the pons and hemispheres most consistent with chronic microvascular ischemic change.  None of the foci appear to be acute. 2.   There are no acute findings.

## 2019-04-10 NOTE — Telephone Encounter (Signed)
Spoke to the patient today and the MRI results have been reviewed.

## 2019-04-10 NOTE — Telephone Encounter (Signed)
I called the patient and reviewed the MRI results provided below.  She would like to move forward with physical therapy.  Referral placed today.  She is also willing to have the previously ordered carotid artery ultrasound.  New orders placed today.

## 2019-04-10 NOTE — Telephone Encounter (Signed)
Please call patient, MRI of the brain showed evidence of chronic micro-small vascular changes.  MRI of lumbar showed evidence of multilevel degenerative changes, with variable degree of foraminal narrowing.  There was no evidence of canal stenosis.      IMPRESSION: This MRI of the brain without contrast shows the following: 1.   T2/flair hyperintense foci in the pons and hemispheres most consistent with chronic microvascular ischemic change.  None of the foci appear to be acute. 2.   There are no acute findings.   IMPRESSION: This MRI of the lumbar spine shows multilevel degenerative changes as detailed above.  The most significant findings are: 1.   At L3-L4 there are degenerative changes causing mild right foraminal narrowing but no nerve root compression or spinal stenosis. 2.   At L4-L5, there are degenerative changes with severe facet hypertrophy causing moderate right lateral recess stenosis and mild foraminal narrowing at left lateral recess stenosis.  There is no nerve root compression or spinal stenosis. 3.   At L5-S1, there are degenerative changes causing mild foraminal narrowing and moderately severe right lateral recess stenosis with potential for right S1 nerve root compression.  There is no spinal stenosis.

## 2019-04-11 ENCOUNTER — Other Ambulatory Visit: Payer: Self-pay | Admitting: *Deleted

## 2019-04-11 DIAGNOSIS — R269 Unspecified abnormalities of gait and mobility: Secondary | ICD-10-CM

## 2019-04-11 DIAGNOSIS — R42 Dizziness and giddiness: Secondary | ICD-10-CM

## 2019-04-11 DIAGNOSIS — R531 Weakness: Secondary | ICD-10-CM

## 2019-04-18 DIAGNOSIS — R6889 Other general symptoms and signs: Secondary | ICD-10-CM | POA: Diagnosis not present

## 2019-04-23 NOTE — Progress Notes (Signed)
Established Patient Office Visit  Subjective:  Patient ID: Sherry [name], female    DOB: May 10, 1956  Age: 63 y.o. MRN: [id number]  CC:  Chief Complaint  Patient presents with  . MRI    follow up on the results of the MRI; states she is not having any falls/memory issues/dizziness anymore  . medication    wants to decrease her Gabapentin from 800 mg to 600 mg;     HPI Sherry [name] presents for discussion of her MRI and her heart disease. Her cousin Carlyon Shadow is present   Patient followed up with Neurology MRI showed chronic microvascular disease and multilievel DJD of spine Advised to do PT for dizziness And to get bilateral carotid US  She is off the Clonidine She reports that when she stakes the Clonidine she falls and cannot make a sentence and she has to rely on people to help her  She reports that off the clonidine her symptoms have resolved and reversed She reports that she still gets dizzy because "stands up too quickly"  Her cousin is asking "is there someone who can talk with the patient about her choices".  Past Medical History:  Diagnosis Date  . Anxiety   . Bipolar and related disorder ([place])   . Blood transfusion without reported diagnosis   . Breast cancer ([place])    right lumpectemy and lymph node removal  . CHF (congestive heart failure) ([place])   . Chronic back pain   . Chronic kidney disease    "Stage IV" - states due to hypertension  . Depression   . GERD (gastroesophageal reflux disease)   . Hepatitis C   . Hypertension   . Left-sided weakness   . MRSA infection    of breast incision  . Opioid dependence ([place])    Has been [facility] in [facility] in past  . Substance abuse ([place])   . TIA (transient ischemic attack) 2020   P/w BP >230/120 and neurologic symptoms, presumed TIA    Past Surgical History:  Procedure Laterality Date  . BACK SURGERY  1990  . BREAST SURGERY Right 2010   "breast cancer survivor" - states partial mastectomy and nodes  .  COLONOSCOPY     [facility]  . ESOPHAGOGASTRODUODENOSCOPY     [facility]  . LAPAROSCOPIC CHOLECYSTECTOMY  2002  . LAPAROSCOPIC INCISIONAL / UMBILICAL / VENTRAL HERNIA REPAIR  04/13/2018   with BARD 15x 97QB mesh (supraumbilical)  . LAPAROSCOPIC LYSIS OF ADHESIONS  07/12/2018   Procedure: LAPAROSCOPIC LYSIS OF ADHESIONS;  Surgeon: Isabel Caprice, MD;  Location: [facility];  Service: Gynecology;;  . MYOMECTOMY     x 2 prior to hysterectomy  . ROBOTIC ASSISTED BILATERAL SALPINGO OOPHERECTOMY Right 07/12/2018   Procedure: XI ROBOTIC ASSISTED RIGHT SALPINGO OOPHORECTOMY;  Surgeon: Isabel Caprice, MD;  Location: [facility];  Service: Gynecology;  Laterality: Right;  . TOTAL ABDOMINAL HYSTERECTOMY     fibroids   . UPPER GASTROINTESTINAL ENDOSCOPY      Family History  Problem Relation Age of Onset  . Heart attack Mother   . Breast cancer Mother 59  . Dementia Mother   . Cancer Father 63       died of bleeding from kidneys  . Colon cancer Neg Hx   . Esophageal cancer Neg Hx   . Stomach cancer Neg Hx   . Rectal cancer Neg Hx     Social History   Socioeconomic History  . Marital status: Single  Spouse name: Not on file  . Number of children: 0  . Years of education: 36  . Highest education level: High school graduate  Occupational History  . Occupation: Disabled  Social Needs  . Financial resource strain: Not on file  . Food insecurity    Worry: Not on file    Inability: Not on file  . Transportation needs    Medical: Not on file    Non-medical: Not on file  Tobacco Use  . Smoking status: Current Every Day Smoker    Packs/day: 0.25    Types: Cigarettes  . Smokeless tobacco: Never Used  . Tobacco comment: 4 cigs per day 01/2019  Substance and Sexual Activity  . Alcohol use: No  . Drug use: Not Currently    Comment: h/o IVD use  . Sexual activity: Not Currently  Lifestyle  . Physical activity    Days per week: Not on file    Minutes per session: Not on  file  . Stress: Not on file  Relationships  . Social Herbalist on phone: Not on file    Gets together: Not on file    Attends religious service: Not on file    Active member of club or organization: Not on file    Attends meetings of clubs or organizations: Not on file    Relationship status: Not on file  . Intimate partner violence    Fear of current or ex partner: Not on file    Emotionally abused: Not on file    Physically abused: Not on file    Forced sexual activity: Not on file  Other Topics Concern  . Not on file  Social History Narrative   Lives at home alone.   Right-handed.   Caffeine use: 4 cups coffee/soda    Outpatient Medications Prior to Visit  Medication Sig Dispense Refill  . ferrous sulfate 325 (65 FE) MG tablet Take 1 tablet (325 mg total) by mouth daily with breakfast. 90 tablet 3  . FLUoxetine (PROZAC) 40 MG capsule Take 40 mg by mouth daily.     . hydrOXYzine (VISTARIL) 25 MG capsule Take 25 mg by mouth 4 (four) times daily as needed for anxiety.    . INGREZZA 80 MG CAPS Take 80 mg by mouth daily.    . isosorbide mononitrate (IMDUR) 60 MG 24 hr tablet TAKE 1 TABLET (60 MG TOTAL) BY MOUTH DAILY. 90 tablet 1  . Multiple Vitamins-Minerals (ADULT GUMMY) CHEW Chew 1 tablet by mouth daily.    Marland Kitchen terazosin (HYTRIN) 2 MG capsule Take 1 capsule (2 mg total) by mouth at bedtime. 90 capsule 1  . varenicline (CHANTIX CONTINUING MONTH PAK) 1 MG tablet Take 1 tablet (1 mg total) by mouth 2 (two) times daily. 60 tablet 6  . ALPRAZolam (XANAX) 1 MG tablet Take 1-2 tablets thirty minutes prior to MRI.  May take one additional tablet before entering scanner, if needed.  MUST HAVE DRIVER. 3 tablet 0  . amLODipine (NORVASC) 10 MG tablet Take 1 tablet (10 mg total) by mouth daily. 90 tablet 0  . gabapentin (NEURONTIN) 800 MG tablet TAKE 1 TABLET BY MOUTH THREE TIMES A DAY 90 tablet 1  . hydrALAZINE (APRESOLINE) 100 MG tablet Take 100 mg by mouth 3 (three) times daily.      . nicotine (NICODERM CQ - DOSED IN MG/24 HOURS) 21 mg/24hr patch Place 1 patch (21 mg total) onto the skin daily. 28 patch 0  . ondansetron (  ZOFRAN) 4 MG tablet TAKE 1 TABLET BY MOUTH EVERY 8 HOURS UP TO 7 DAYS AS NEEDED FOR NAUSEA & VOMITING (Patient taking differently: Take 4 mg by mouth every 8 (eight) hours as needed for nausea or vomiting. ) 20 tablet 0  . oxybutynin (DITROPAN-XL) 5 MG 24 hr tablet TAKE 1 TABLET BY MOUTH AT BEDTIME (Patient not taking: Reported on 05/09/2019) 90 tablet 1  . cloNIDine (CATAPRES) 0.1 MG tablet Take 1 tablet (0.1 mg total) by mouth 2 (two) times daily. 60 tablet 2  . pantoprazole (PROTONIX) 40 MG tablet Take 1 tablet (40 mg total) by mouth 2 (two) times daily. (Patient not taking: Reported on 05/09/2019) 90 tablet 3   No facility-administered medications prior to visit.     Allergies  Allergen Reactions  . Abilify [Aripiprazole] Other (See Comments)    Tardive dyskinesia Oral  . Remeron [Mirtazapine] Other (See Comments)    Wgt stimulation /gain, Dizziness, Patient says "can tolerate"  . Trazodone And Nefazodone Other (See Comments)    Nightmares/sleep diturbance  . Flexeril [Cyclobenzaprine] Other (See Comments)    Pt states Flexeril makes her feel depressed   . Amoxicillin Diarrhea and Other (See Comments)    NOTE the patient has had PCN WITHOUT reaction Has patient had a PCN reaction causing immediate rash, facial/tongue/throat swelling, SOB or lightheadedness with hypotension: No Has patient had a PCN reaction causing severe rash involving mucus membranes or skin necrosis: No Has patient had a PCN reaction that required hospitalization: No Has patient had a PCN reaction occurring within the last 10 years: No If all of the above answers are "NO", then may proceed with Cephalosporin use.     ROS Review of Systems Review of Systems  Constitutional: Negative for activity change, appetite change, chills and fever.  HENT: Negative for congestion,  nosebleeds, trouble swallowing and voice change.   Respiratory: Negative for cough, shortness of breath and wheezing.   Gastrointestinal: Negative for diarrhea, nausea and vomiting.  Genitourinary: Negative for difficulty urinating, dysuria, flank pain and hematuria.  Musculoskeletal: Negative for back pain, joint swelling and neck pain.  Neurological: Negative for dizziness, speech difficulty, light-headedness and numbness.  See HPI. All other review of systems negative.      Objective:    Physical Exam  BP (!) 171/90   Pulse 71   Temp 99 F (37.2 C) (Oral)   Resp 18   Ht 5\' 2"  (1.575 m)   Wt 242 lb 6.4 oz (110 kg)   SpO2 98%   BMI 44.34 kg/m  Wt Readings from Last 3 Encounters:  06/03/19 245 lb (111.1 kg)  06/03/19 243 lb 2.7 oz (110.3 kg)  06/01/19 245 lb (111.1 kg)   Physical Exam  Constitutional: Oriented to person, place, and time. Appears well-developed and well-nourished.  HENT:  Head: Normocephalic and atraumatic.  Eyes: Conjunctivae and EOM are normal.  Cardiovascular: Normal rate, regular rhythm, normal heart sounds and intact distal pulses.  No murmur heard. Pulmonary/Chest: Effort normal and breath sounds normal. No stridor. No respiratory distress. Has no wheezes.  Neurological: Is alert and oriented to person, place, and time.  Skin: Skin is warm. Capillary refill takes less than 2 seconds.  Psychiatric: Has a normal mood and affect. Behavior is normal. Judgment and thought content normal.    IMPRESSION: This MRI of the brain without contrast shows the following: 1.   T2/flair hyperintense foci in the pons and hemispheres most consistent with chronic microvascular ischemic change.  None of  the foci appear to be acute. 2.   There are no acute findings.   IMPRESSION: This MRI of the lumbar spine shows multilevel degenerative changes as detailed above.  The most significant findings are: 1.   At L3-L4 there are degenerative changes causing mild right  foraminal narrowing but no nerve root compression or spinal stenosis. 2.   At L4-L5, there are degenerative changes with severe facet hypertrophy causing moderate right lateral recess stenosis and mild foraminal narrowing at left lateral recess stenosis.  There is no nerve root compression or spinal stenosis. 3.   At L5-S1, there are degenerative changes causing mild foraminal narrowing and moderately severe right lateral recess stenosis with potential for right S1 nerve root compression.  There is no spinal stenosis.    There are no preventive care reminders to display for this patient.  There are no preventive care reminders to display for this patient.  Lab Results  Component Value Date   TSH 0.882 10/21/2017   Lab Results  Component Value Date   WBC 5.2 05/10/2019   HGB 10.9 (L) 05/10/2019   HCT 35.5 (L) 05/10/2019   MCV 90.8 05/10/2019   PLT 265 05/10/2019   Lab Results  Component Value Date   NA 136 05/11/2019   K 4.2 05/11/2019   CO2 18 (L) 05/11/2019   GLUCOSE 136 (H) 05/11/2019   BUN 37 (H) 05/11/2019   CREATININE 2.93 (H) 05/11/2019   BILITOT 0.3 05/08/2019   ALKPHOS 109 05/08/2019   AST 13 (L) 05/08/2019   ALT 11 05/08/2019   PROT 7.9 05/08/2019   ALBUMIN 3.6 05/11/2019   CALCIUM 8.9 05/11/2019   ANIONGAP 13 05/11/2019   Lab Results  Component Value Date   CHOL 266 (H) 05/09/2019   Lab Results  Component Value Date   HDL 55 05/09/2019   Lab Results  Component Value Date   LDLCALC 188 (H) 05/09/2019   Lab Results  Component Value Date   TRIG 117 05/09/2019   Lab Results  Component Value Date   CHOLHDL 4.8 05/09/2019   Lab Results  Component Value Date   HGBA1C 5.4 05/09/2019      Assessment & Plan:   Problem List Items Addressed This Visit      Cardiovascular and Mediastinum   Essential hypertension     Other   Dizziness    Other Visit Diagnoses    Medically complex patient    -  Primary   At high risk for falls          Patient with multiple medical conditions Discussed to patient and her cousin the ways that the patient's lifestyle impacts her health Discussed and reviewed and read the assessment and plan of specialists Discussed that her non-compliance makes her management difficult In great detailed reviewed the importance of compliance Discussed that she has depression that is a confounding factor.  Meds ordered this encounter  Medications  . DISCONTD: gabapentin (NEURONTIN) 600 MG tablet    Sig: Take 1 tablet (600 mg total) by mouth 3 (three) times daily.    Dispense:  90 tablet    Refill:  6    Follow-up: No follow-ups on file.   A total of 40 minutes were spent face-to-face with the patient during this encounter and over half of that time was spent on counseling and coordination of care.  Forrest Moron, MD

## 2019-04-24 ENCOUNTER — Encounter: Payer: Self-pay | Admitting: Family Medicine

## 2019-04-24 ENCOUNTER — Other Ambulatory Visit: Payer: Self-pay

## 2019-04-24 ENCOUNTER — Telehealth: Payer: Self-pay | Admitting: Family Medicine

## 2019-04-24 ENCOUNTER — Ambulatory Visit (INDEPENDENT_AMBULATORY_CARE_PROVIDER_SITE_OTHER): Payer: Medicare HMO | Admitting: Family Medicine

## 2019-04-24 VITALS — BP 171/90 | HR 71 | Temp 99.0°F | Resp 18 | Ht 62.0 in | Wt 242.4 lb

## 2019-04-24 DIAGNOSIS — Z789 Other specified health status: Secondary | ICD-10-CM | POA: Diagnosis not present

## 2019-04-24 DIAGNOSIS — Z9181 History of falling: Secondary | ICD-10-CM | POA: Diagnosis not present

## 2019-04-24 DIAGNOSIS — Z23 Encounter for immunization: Secondary | ICD-10-CM | POA: Diagnosis not present

## 2019-04-24 DIAGNOSIS — R42 Dizziness and giddiness: Secondary | ICD-10-CM

## 2019-04-24 DIAGNOSIS — I10 Essential (primary) hypertension: Secondary | ICD-10-CM

## 2019-04-24 MED ORDER — GABAPENTIN 600 MG PO TABS: 600.0000 mg | ORAL_TABLET | Freq: Three times a day (TID) | ORAL | 6 refills | Status: DC

## 2019-04-24 NOTE — Telephone Encounter (Signed)
Pt can not schedule her appt with her heart doctor as referred to by [facility]. Please contact office on behalf of patient to schedule appt. CB is [phone] as per her referral. F/u with pt at [phone]

## 2019-04-24 NOTE — Patient Instructions (Addendum)
  Call Dr. Geraldo Pitter for follow up appointment [phone]  Take a half tablet of clonidine or one tablet once a day if the clonidine is too small to cut  Follow up with Cardiology  Try to limit fried foods, oily foods and salty foods.    If you have lab work done today you will be contacted with your lab results within the next 2 weeks.  If you have not heard from [place] then please contact us. The fastest way to get your results is to register for My Chart.   IF you received an x-ray today, you will receive an invoice from [facility]. Please contact [facility] at [phone] with questions or concerns regarding your invoice.   IF you received labwork today, you will receive an invoice from [facility]. Please contact [facility] at [phone] with questions or concerns regarding your invoice.   Our billing staff will not be able to assist you with questions regarding bills from these companies.  You will be contacted with the lab results as soon as they are available. The fastest way to get your results is to activate your My Chart account. Instructions are located on the last page of this paperwork. If you have not heard from [place] regarding the results in 2 weeks, please contact this office.

## 2019-04-25 ENCOUNTER — Other Ambulatory Visit: Payer: Self-pay

## 2019-04-25 ENCOUNTER — Emergency Department (HOSPITAL_COMMUNITY)
Admission: EM | Admit: 2019-04-25 | Discharge: 2019-04-25 | Disposition: A | Discharge status: Home / Self Care | Payer: Medicare HMO | Attending: Emergency Medicine | Admitting: Emergency Medicine

## 2019-04-25 ENCOUNTER — Encounter (HOSPITAL_COMMUNITY): Payer: Self-pay

## 2019-04-25 ENCOUNTER — Emergency Department (HOSPITAL_COMMUNITY): Payer: Medicare HMO

## 2019-04-25 DIAGNOSIS — B182 Chronic viral hepatitis C: Secondary | ICD-10-CM | POA: Insufficient documentation

## 2019-04-25 DIAGNOSIS — F319 Bipolar disorder, unspecified: Secondary | ICD-10-CM | POA: Insufficient documentation

## 2019-04-25 DIAGNOSIS — M546 Pain in thoracic spine: Secondary | ICD-10-CM | POA: Insufficient documentation

## 2019-04-25 DIAGNOSIS — R110 Nausea: Secondary | ICD-10-CM | POA: Insufficient documentation

## 2019-04-25 DIAGNOSIS — F17210 Nicotine dependence, cigarettes, uncomplicated: Secondary | ICD-10-CM | POA: Diagnosis not present

## 2019-04-25 DIAGNOSIS — Z8614 Personal history of Methicillin resistant Staphylococcus aureus infection: Secondary | ICD-10-CM | POA: Diagnosis not present

## 2019-04-25 DIAGNOSIS — N184 Chronic kidney disease, stage 4 (severe): Secondary | ICD-10-CM | POA: Insufficient documentation

## 2019-04-25 DIAGNOSIS — Z853 Personal history of malignant neoplasm of breast: Secondary | ICD-10-CM | POA: Insufficient documentation

## 2019-04-25 DIAGNOSIS — I129 Hypertensive chronic kidney disease with stage 1 through stage 4 chronic kidney disease, or unspecified chronic kidney disease: Secondary | ICD-10-CM | POA: Insufficient documentation

## 2019-04-25 DIAGNOSIS — Z79899 Other long term (current) drug therapy: Secondary | ICD-10-CM | POA: Diagnosis not present

## 2019-04-25 DIAGNOSIS — E669 Obesity, unspecified: Secondary | ICD-10-CM | POA: Diagnosis not present

## 2019-04-25 DIAGNOSIS — R0789 Other chest pain: Secondary | ICD-10-CM | POA: Diagnosis not present

## 2019-04-25 DIAGNOSIS — Z6841 Body Mass Index (BMI) 40.0 and over, adult: Secondary | ICD-10-CM | POA: Diagnosis not present

## 2019-04-25 DIAGNOSIS — R079 Chest pain, unspecified: Secondary | ICD-10-CM | POA: Diagnosis not present

## 2019-04-25 DIAGNOSIS — R6889 Other general symptoms and signs: Secondary | ICD-10-CM | POA: Diagnosis not present

## 2019-04-25 DIAGNOSIS — I10 Essential (primary) hypertension: Secondary | ICD-10-CM | POA: Diagnosis not present

## 2019-04-25 DIAGNOSIS — M7918 Myalgia, other site: Secondary | ICD-10-CM

## 2019-04-25 DIAGNOSIS — M25519 Pain in unspecified shoulder: Secondary | ICD-10-CM | POA: Diagnosis not present

## 2019-04-25 LAB — TROPONIN I (HIGH SENSITIVITY)
Troponin I (High Sensitivity): 6 ng/L (ref <18)
Troponin I (High Sensitivity): 7 ng/L (ref <18)

## 2019-04-25 LAB — BASIC METABOLIC PANEL
Anion gap: 12 (ref 5–15)
BUN: 32 mg/dL — ABNORMAL HIGH (ref 8–23)
CO2: 21 mmol/L — ABNORMAL LOW (ref 22–32)
Calcium: 9.1 mg/dL (ref 8.9–10.3)
Chloride: 106 mmol/L (ref 98–111)
Creatinine, Ser: 2.15 mg/dL — ABNORMAL HIGH (ref 0.44–1.00)
GFR calc Af Amer: 28 mL/min — ABNORMAL LOW (ref >60)
GFR calc non Af Amer: 24 mL/min — ABNORMAL LOW (ref >60)
Glucose, Bld: 93 mg/dL (ref 70–99)
Potassium: 5.1 mmol/L (ref 3.5–5.1)
Sodium: 139 mmol/L (ref 135–145)

## 2019-04-25 LAB — HEPATIC FUNCTION PANEL
ALT: 10 U/L (ref 0–44)
AST: 17 U/L (ref 15–41)
Albumin: 3.4 g/dL — ABNORMAL LOW (ref 3.5–5.0)
Alkaline Phosphatase: 97 U/L (ref 38–126)
Bilirubin, Direct: <0.1 mg/dL (ref 0.0–0.2)
Total Bilirubin: 0.4 mg/dL (ref 0.3–1.2)
Total Protein: 7.1 g/dL (ref 6.5–8.1)

## 2019-04-25 LAB — CBC
HCT: 34.1 % — ABNORMAL LOW (ref 36.0–46.0)
Hemoglobin: 10.4 g/dL — ABNORMAL LOW (ref 12.0–15.0)
MCH: 27.7 pg (ref 26.0–34.0)
MCHC: 30.5 g/dL (ref 30.0–36.0)
MCV: 90.7 fL (ref 80.0–100.0)
Platelets: 280 10*3/uL (ref 150–400)
RBC: 3.76 MIL/uL — ABNORMAL LOW (ref 3.87–5.11)
RDW: 17.1 % — ABNORMAL HIGH (ref 11.5–15.5)
WBC: 5.9 10*3/uL (ref 4.0–10.5)
nRBC: 0 % (ref 0.0–0.2)

## 2019-04-25 LAB — LIPASE, BLOOD: Lipase: 40 U/L (ref 11–51)

## 2019-04-25 MED ORDER — LIDOCAINE 5 % EX PTCH: 1.0000 | MEDICATED_PATCH | CUTANEOUS | 0 refills | Status: DC

## 2019-04-25 MED ORDER — CLONIDINE HCL 0.1 MG PO TABS
0.1000 mg | ORAL_TABLET | Freq: Once | ORAL | Status: AC
  Administered 2019-04-25: 0.1 mg via ORAL
  Filled 2019-04-25: qty 1

## 2019-04-25 MED ORDER — MORPHINE SULFATE (PF) 4 MG/ML IV SOLN
4.0000 mg | Freq: Once | INTRAVENOUS | Status: AC
  Administered 2019-04-25: 16:00:00 4 mg via INTRAVENOUS
  Filled 2019-04-25: qty 1

## 2019-04-25 MED ORDER — ACETAMINOPHEN 325 MG PO TABS
650.0000 mg | ORAL_TABLET | Freq: Once | ORAL | Status: AC
  Administered 2019-04-25: 650 mg via ORAL
  Filled 2019-04-25: qty 2

## 2019-04-25 MED ORDER — LIDOCAINE 5 % EX PTCH
1.0000 | MEDICATED_PATCH | Freq: Once | CUTANEOUS | Status: DC
  Administered 2019-04-25: 1 via TRANSDERMAL
  Filled 2019-04-25: qty 1

## 2019-04-25 MED ORDER — SODIUM CHLORIDE 0.9% FLUSH: 3.0000 mL | Freq: Once | INTRAVENOUS | Status: DC

## 2019-04-25 NOTE — ED Notes (Signed)
Pt ambulated to restroom with stand by assist for safety. Pt had steady gait and tolerated well.

## 2019-04-25 NOTE — Discharge Instructions (Addendum)
All of your labwork, EKG, and chest x ray were very reassuring Please use lidocaine patches as prescribed. You may also take Tylenol as well Please follow up with your PCP Keep your appointment with cardiology as scheduled  Continue taking all of your medications including the clonidine as prescribed

## 2019-04-25 NOTE — Progress Notes (Signed)
CSW at bedside with patient to consult for social work related needs. CSW provided pt with taxi voucher home to assist with discharge needs.   [facility]  Clinical Social Worker  Ph: [phone]

## 2019-04-25 NOTE — ED Notes (Signed)
Pt refusing to goto XR until seen by a Dr. And given something for pain

## 2019-04-25 NOTE — ED Notes (Signed)
Patched placed left chest / shoulder area

## 2019-04-25 NOTE — ED Triage Notes (Addendum)
Pt BIB [facility] for eval of CP to L side, under breast and R shoulder x 4 hours. Coming from nephrologist office. Pt L shoulder is extremely TTP, worse w/ movement. GCS 15. Pt is not currently receiving dialysis. Pt rec'd 2 SL NTG and 324ASA w/ no change in pain or status,

## 2019-04-25 NOTE — ED Notes (Signed)
Pt requesting chaplin to bedside.

## 2019-04-25 NOTE — ED Notes (Signed)
Spoke with chaplin . He will be coming down to speak with her.

## 2019-04-25 NOTE — ED Provider Notes (Signed)
[facility] DEPARTMENT Provider Note   CSN: [id number] Arrival date & time: 04/25/19  1357     History   Chief Complaint Chief Complaint  Patient presents with  . Chest Pain    HPI Sherry [name] is a 63 y.o. female with PMHx anxiety, bipolar disorder, CHF with EF 60-65%, chronic back pain, CKD stage IV, depression, GERD, HTN, who presents to the ED today complaining of sudden onset, constant, sharp, 10/10, left sided chest pain radiating into left upper back that began 2 hours ago.  Patient reports that she was at her nephrologist office for a follow-up visit when she began having the chest pain.  She is also complaining of some shortness of breath and nausea.  Per triage report she was given 2 sublingual nitroglycerin en route and a 325 mg aspirin without relief.  She reports she has never had pain like this in the past.  She does have risk factors for heart disease.  She does state that she has family history of CAD as well.  No recent prolonged travel or immobilization.  No history DVT/PE.  No hemoptysis.  No estrogen therapy.  No active malignancy.  Patient has had breast cancer approximately 10 years ago but states that she has been in remission since then.  [place] fever, chills, cough, leg swelling, palpitations, vomiting, any other associated symptoms.        Past Medical History:  Diagnosis Date  . Anxiety   . Bipolar and related disorder ([place])   . Blood transfusion without reported diagnosis   . Breast cancer ([place])    right lumpectemy and lymph node removal  . CHF (congestive heart failure) ([place])   . Chronic back pain   . Chronic kidney disease    "Stage IV" - states due to hypertension  . Depression   . GERD (gastroesophageal reflux disease)   . Hepatitis C   . Hypertension   . Left-sided weakness   . MRSA infection    of breast incision  . Opioid dependence ([place])    Has been treated in [facility] in past  . Substance abuse [facility])      Patient Active Problem List   Diagnosis Date Noted  . Bilateral low back pain with bilateral sciatica 01/05/2019  . Cyst of ovary   . Pre-operative cardiovascular examination 06/13/2018  . Dizziness 02/24/2018  . Left-sided weakness 02/24/2018  . Cigarette smoker 12/31/2017  . DOE (dyspnea on exertion) 12/30/2017  . Phlebitis after infusion 10/28/2017  . Hypertensive emergency 10/21/2017  . Acute diastolic CHF (congestive heart failure) ([place]) 10/21/2017  . Microcytic anemia 10/21/2017  . Medication intolerance 09/06/2017  . Neuroleptic-induced tardive dyskinesia 09/06/2017  . Cancer ([place]) 09/06/2017  . Chronic low back pain 09/06/2017  . Grief reaction with prolonged bereavement 09/06/2017  . Opioid use disorder, severe, dependence ([place]) 08/27/2017  . Alcohol use disorder, severe, dependence ([place]) 08/27/2017  . Substance induced mood disorder ([place]) 08/13/2017  . AKI (acute kidney injury) ([place])   . MDD (major depressive disorder), recurrent severe, without psychosis ([place])   . Alcohol withdrawal ([place]) 08/09/2017  . Essential hypertension 07/06/2017  . Chronic kidney disease, stage III (moderate) 11/17/2016  . Anxiety 08/21/2016  . Hx of bipolar disorder 01/31/2016  . History of breast cancer 02/13/2013  . Morbid obesity due to excess calories ([place]) 02/13/2013  . Hepatitis C virus infection cured after antiviral drug therapy 12/17/2012    Past Surgical History:  Procedure Laterality Date  .  BACK SURGERY  1990  . BREAST SURGERY Right 2010   "breast cancer survivor" - states partial mastectomy and nodes  . COLONOSCOPY     [facility]  . ESOPHAGOGASTRODUODENOSCOPY     [facility]  . LAPAROSCOPIC CHOLECYSTECTOMY  2002  . LAPAROSCOPIC INCISIONAL / UMBILICAL / VENTRAL HERNIA REPAIR  04/13/2018   with BARD 15x 19JK mesh (supraumbilical)  . LAPAROSCOPIC LYSIS OF ADHESIONS  07/12/2018   Procedure: LAPAROSCOPIC LYSIS OF ADHESIONS;  Surgeon: Isabel Caprice, MD;   Location: [facility] ORS;  Service: Gynecology;;  . MYOMECTOMY     x 2 prior to hysterectomy  . ROBOTIC ASSISTED BILATERAL SALPINGO OOPHERECTOMY Right 07/12/2018   Procedure: XI ROBOTIC ASSISTED RIGHT SALPINGO OOPHORECTOMY;  Surgeon: Isabel Caprice, MD;  Location: [facility] ORS;  Service: Gynecology;  Laterality: Right;  . TOTAL ABDOMINAL HYSTERECTOMY     fibroids   . UPPER GASTROINTESTINAL ENDOSCOPY       OB History   No obstetric history on file.      Home Medications    Prior to Admission medications   Medication Sig Start Date End Date Taking? Authorizing Provider  ALPRAZolam Duanne Moron) 1 MG tablet Take 1-2 tablets thirty minutes prior to MRI.  May take one additional tablet before entering scanner, if needed.  MUST HAVE DRIVER. 9/32/67   Marcial Pacas, MD  amLODipine (NORVASC) 10 MG tablet Take 1 tablet (10 mg total) by mouth daily. 02/10/19   Forrest Moron, MD  cloNIDine (CATAPRES) 0.1 MG tablet Take 1 tablet (0.1 mg total) by mouth 2 (two) times daily. Patient not taking: Reported on 04/24/2019 01/02/19   Forrest Moron, MD  ferrous sulfate 325 (65 FE) MG tablet Take 1 tablet (325 mg total) by mouth daily with breakfast. 10/27/18   Forrest Moron, MD  FLUoxetine (PROZAC) 40 MG capsule 1 capsule daily. 11/09/18   [provider]  gabapentin (NEURONTIN) 600 MG tablet Take 1 tablet (600 mg total) by mouth 3 (three) times daily. 04/24/19   Forrest Moron, MD  hydrALAZINE (APRESOLINE) 100 MG tablet Take 100 mg by mouth 3 (three) times daily.  04/26/18   [provider]  hydrOXYzine (VISTARIL) 25 MG capsule Take 25 mg by mouth 4 (four) times daily as needed for anxiety. 12/26/18   [provider]  INGREZZA 80 MG CAPS Take 80 mg by mouth daily. 10/15/17   [provider]  isosorbide mononitrate (IMDUR) 60 MG 24 hr tablet TAKE 1 TABLET (60 MG TOTAL) BY MOUTH DAILY. 12/27/18   Forrest Moron, MD  lidocaine (LIDODERM) 5 % Place 1 patch onto the skin daily. Remove &  Discard patch within 12 hours or as directed by MD 04/25/19   Eustaquio Maize, PA-C  Multiple Vitamins-Minerals (ADULT GUMMY) CHEW Chew 1 tablet by mouth daily. 07/21/17   [provider]  nicotine (NICODERM CQ - DOSED IN MG/24 HOURS) 21 mg/24hr patch Place 1 patch (21 mg total) onto the skin daily. 12/28/18   Delia Chimes A, MD  ondansetron (ZOFRAN) 4 MG tablet TAKE 1 TABLET BY MOUTH EVERY 8 HOURS UP TO 7 DAYS AS NEEDED FOR NAUSEA & VOMITING 02/10/19   Delia Chimes A, MD  oxybutynin (DITROPAN-XL) 5 MG 24 hr tablet TAKE 1 TABLET BY MOUTH AT BEDTIME 12/27/18   Delia Chimes A, MD  pantoprazole (PROTONIX) 40 MG tablet Take 1 tablet (40 mg total) by mouth 2 (two) times daily. 01/25/19 03/23/19  Cirigliano, Vito V, DO  terazosin (HYTRIN)  2 MG capsule Take 1 capsule (2 mg total) by mouth at bedtime. 10/27/18   Forrest Moron, MD  varenicline (CHANTIX CONTINUING MONTH PAK) 1 MG tablet Take 1 tablet (1 mg total) by mouth 2 (two) times daily. 01/02/19   Forrest Moron, MD    Family History Family History  Problem Relation Age of Onset  . Heart attack Mother   . Breast cancer Mother 58  . Dementia Mother   . Cancer Father 54       died of bleeding from kidneys  . Colon cancer Neg Hx   . Esophageal cancer Neg Hx   . Stomach cancer Neg Hx   . Rectal cancer Neg Hx     Social History Social History   Tobacco Use  . Smoking status: Current Every Day Smoker    Packs/day: 0.25    Types: Cigarettes  . Smokeless tobacco: Never Used  . Tobacco comment: 4 cigs per day 01/2019  Substance Use Topics  . Alcohol use: No  . Drug use: Not Currently    Comment: h/o IVD use     Allergies   Abilify [aripiprazole], Remeron [mirtazapine], Trazodone and nefazodone, Flexeril [cyclobenzaprine], and Amoxicillin   Review of Systems Review of Systems  Constitutional: Negative for chills and fever.  HENT: Negative for congestion.   Eyes: Negative for visual disturbance.  Respiratory: Positive for  shortness of breath. Negative for cough.   Cardiovascular: Positive for chest pain. Negative for palpitations and leg swelling.  Gastrointestinal: Positive for nausea. Negative for constipation, diarrhea and vomiting.  Genitourinary: Negative for difficulty urinating.  Musculoskeletal: Positive for back pain.  Skin: Negative for rash.  Neurological: Negative for syncope and headaches.     Physical Exam Updated Vital Signs BP (!) 186/86 (BP Location: Left Wrist)   Pulse 75   Temp 99.5 F (37.5 C) (Oral)   Resp 18   Ht 5\' 2"  (1.575 m)   Wt 113.4 kg   SpO2 94%   BMI 45.73 kg/m   Physical Exam Vitals signs and nursing note reviewed.  Constitutional:      Appearance: She is obese. She is not ill-appearing.  HENT:     Head: Normocephalic and atraumatic.  Eyes:     Conjunctiva/sclera: Conjunctivae normal.  Neck:     Musculoskeletal: Neck supple.  Cardiovascular:     Rate and Rhythm: Normal rate and regular rhythm.     Pulses:          Radial pulses are 2+ on the right side and 2+ on the left side.       Dorsalis pedis pulses are 2+ on the right side and 2+ on the left side.     Heart sounds: Normal heart sounds.  Pulmonary:     Effort: Pulmonary effort is normal.     Breath sounds: Normal breath sounds. No decreased breath sounds, wheezing, rhonchi or rales.  Chest:     Chest wall: Tenderness present.  Abdominal:     Palpations: Abdomen is soft.     Tenderness: There is abdominal tenderness. There is no guarding or rebound.     Comments: Soft, mild tenderness to epigastrium and LUQ,  +BS throughout, no r/g/r, neg murphy's, neg mcburney's, no CVA TTP  Musculoskeletal:     Right lower leg: No edema.     Left lower leg: No edema.     Comments: No C, T, or L midline spinal tenderness. Very reproducible left thoracic paraspinal TTP. ROM intact to neck,  back, BUE, and BLEs. Strength 5/5 to all extremities. Sensation intact throughout.   Skin:    General: Skin is warm and dry.   Neurological:     Mental Status: She is alert.      ED Treatments / Results  Labs (all labs ordered are listed, but only abnormal results are displayed) Labs Reviewed  BASIC METABOLIC PANEL - Abnormal; Notable for the following components:      Result Value   CO2 21 (*)    BUN 32 (*)    Creatinine, Ser 2.15 (*)    GFR calc non Af Amer 24 (*)    GFR calc Af Amer 28 (*)    All other components within normal limits  CBC - Abnormal; Notable for the following components:   RBC 3.76 (*)    Hemoglobin 10.4 (*)    HCT 34.1 (*)    RDW 17.1 (*)    All other components within normal limits  HEPATIC FUNCTION PANEL - Abnormal; Notable for the following components:   Albumin 3.4 (*)    All other components within normal limits  LIPASE, BLOOD  TROPONIN I (HIGH SENSITIVITY)  TROPONIN I (HIGH SENSITIVITY)    EKG EKG Interpretation  Date/Time:  Tuesday April 25 2019 14:03:25 EDT Ventricular Rate:  73 PR Interval:    QRS Duration: 78 QT Interval:  405 QTC Calculation: 447 R Axis:   42 Text Interpretation:  Sinus rhythm Probable left atrial enlargement No significant change since last tracing Confirmed by Quintella Reichert [phone]) on 04/25/2019 3:00:37 PM   Radiology Dg Chest 2 View  Result Date: 04/25/2019 CLINICAL DATA:  Chest pain EXAM: CHEST - 2 VIEW COMPARISON:  04/23/2018 FINDINGS: Heart is mildly enlarged. No confluent airspace opacities, effusions or edema. No acute bony abnormality. IMPRESSION: Mild cardiomegaly.  No active disease. Electronically Signed   By: Rolm Baptise M.D.   On: 04/25/2019 16:24    Procedures Procedures (including critical care time)  Medications Ordered in ED Medications  sodium chloride flush (NS) 0.9 % injection 3 mL (3 mLs Intravenous Not Given 04/25/19 1534)  lidocaine (LIDODERM) 5 % 1 patch (1 patch Transdermal Patch Applied 04/25/19 1720)  morphine 4 MG/ML injection 4 mg (4 mg Intravenous Given 04/25/19 1537)  acetaminophen (TYLENOL)  tablet 650 mg (650 mg Oral Given 04/25/19 1717)  cloNIDine (CATAPRES) tablet 0.1 mg (0.1 mg Oral Given 04/25/19 1716)     Initial Impression / Assessment and Plan / ED Course  I have reviewed the triage vital signs and the nursing notes.  Pertinent labs & imaging results that were available during my care of the patient were reviewed by me and considered in my medical decision making (see chart for details).    63 year old female who presents to the ED today complaining of sudden onset left chest pain radiating into left back.  Was given 2 nitroglycerin in route as well as an aspirin without relief of her symptoms.  On exam she appears very uncomfortable.  She has reproducible rest throughout her left chest and left thoracic back.  SHe also has some tenderness to the epigastrium and left upper quadrant.  Denying any increase in activity or lifting of heavy objects.  No trauma. No cough.  Suspect more musculoskeletal pain in nature but will rule out ACS today.  Check liver functions as well as lipase.  Patient denies any heavy alcohol use.  Does not have any lower abdominal tenderness to warrant a CT scan.   Dr. Ralene Bathe attending  physician evaluated patient and agrees that patient's pain appears to be more musculoskeletal in nature.  EKG without ischemic changes today.  Initial troponin of 6.  No leukocytosis today.  Hemoglobin is stable compared to baseline.  No electrolyte abnormalities today.  Creatinine stable from baseline.  No elevation in LFTs.  Lipase is negative. chest X-ray negative.   Reevaluation patient states she is still continuing to have pain despite 2 nitroglycerin, aspirin, 4 mg of morphine.  Given this is likely musculoskeletal in nature will give Tylenol as well as a Lidoderm patch in the ED today and reevaluate.   When I walk past the room patient appears comfortable laying in bed watching TV. The minute I proceed into the room patient states her pain is out of control. Repeat  troponin is 7. Heart score of 3. Feel patient is stable for discharge home at this time. I have prescribed lidocaine patches for her to use and advise she take Tylenol as well for her pain. She is to follow up with her PCP. She also has an appointment scheduled with cardiology in 2 weeks time. I have advised that she keep this. Strict return precautions have been discussed with patient. She is in agreement with plan at this time and stable for discharge home.   This note was prepared using Dragon voice recognition software and may include unintentional dictation errors due to the inherent limitations of voice recognition software.       Final Clinical Impressions(s) / ED Diagnoses   Final diagnoses:  Nonspecific chest pain  Musculoskeletal pain    ED Discharge Orders         Ordered    lidocaine (LIDODERM) 5 %  Every 24 hours     04/25/19 1923           Eustaquio Maize, PA-C 04/25/19 1926    Quintella Reichert, MD 04/26/19 [phone]

## 2019-04-25 NOTE — ED Notes (Signed)
Patient has agreed to discharge instructions and follow up care. Social work consulted and spoke with patient. Time given for teach back and questions.

## 2019-04-25 NOTE — ED Notes (Signed)
Pt called out X2 for pain meds.

## 2019-04-25 NOTE — ED Notes (Signed)
Pt requesting more pain meds. PA aware.

## 2019-04-25 NOTE — Progress Notes (Signed)
Responded to page for spiritual care consult. Nurse stated Pt was needing spritual care. Sherry [name] was alert and talking. She mentioned concern for her pain saying that Staff has not found what is wrong with her. I noticed she had trouble speaking with a swollen tongue. She requested I find a resource for tansportation if she gets discharged. She said doesnt have transportation to go back to [place] where she lives. I informed her Nurse.Lauriana said she has a sister, but did not want to contact her. I offered pt spiritual care with words of comfort, ministry of presence, and prayer.   Chaplain Fidel Levy  [phone]

## 2019-05-02 ENCOUNTER — Encounter: Payer: Self-pay | Admitting: Physical Therapy

## 2019-05-02 ENCOUNTER — Ambulatory Visit: Payer: Medicare HMO | Attending: Neurology | Admitting: Physical Therapy

## 2019-05-02 ENCOUNTER — Other Ambulatory Visit: Payer: Self-pay

## 2019-05-02 DIAGNOSIS — G8929 Other chronic pain: Secondary | ICD-10-CM | POA: Diagnosis not present

## 2019-05-02 DIAGNOSIS — R262 Difficulty in walking, not elsewhere classified: Secondary | ICD-10-CM

## 2019-05-02 DIAGNOSIS — R6889 Other general symptoms and signs: Secondary | ICD-10-CM | POA: Diagnosis not present

## 2019-05-02 DIAGNOSIS — M6281 Muscle weakness (generalized): Secondary | ICD-10-CM | POA: Diagnosis not present

## 2019-05-02 DIAGNOSIS — R2681 Unsteadiness on feet: Secondary | ICD-10-CM | POA: Diagnosis not present

## 2019-05-02 DIAGNOSIS — M5442 Lumbago with sciatica, left side: Secondary | ICD-10-CM | POA: Insufficient documentation

## 2019-05-02 DIAGNOSIS — M542 Cervicalgia: Secondary | ICD-10-CM

## 2019-05-02 NOTE — Therapy (Addendum)
[facility] [address]  [place], [place], [address] Phone: [phone]   Fax:  [phone]  Physical Therapy Evaluation  Patient Details  Name: Sherry [name] MRN: [id number] Date of Birth: Nov 29, 1955 Referring Provider (PT): Marcial Pacas, MD   Encounter Date: 05/02/2019  PT End of Session - 05/02/19 1634    Visit Number  1    Number of Visits  7    Date for PT Re-Evaluation  06/13/19    Authorization Type  [employer] & Medicaid    PT Start Time  1447    PT Stop Time  1529    PT Time Calculation (min)  42 min    Activity Tolerance  Patient limited by pain    Behavior During Therapy  [facility] for tasks assessed/performed       Past Medical History:  Diagnosis Date  . Anxiety   . Bipolar and related disorder ([place])   . Blood transfusion without reported diagnosis   . Breast cancer ([place])    right lumpectemy and lymph node removal  . CHF (congestive heart failure) ([place])   . Chronic back pain   . Chronic kidney disease    "Stage IV" - states due to hypertension  . Depression   . GERD (gastroesophageal reflux disease)   . Hepatitis C   . Hypertension   . Left-sided weakness   . MRSA infection    of breast incision  . Opioid dependence ([place])    Has been treated in [facility] in past  . Substance abuse [facility])     Past Surgical History:  Procedure Laterality Date  . BACK SURGERY  1990  . BREAST SURGERY Right 2010   "breast cancer survivor" - states partial mastectomy and nodes  . COLONOSCOPY     [facility]  . ESOPHAGOGASTRODUODENOSCOPY     [facility]  . LAPAROSCOPIC CHOLECYSTECTOMY  2002  . LAPAROSCOPIC INCISIONAL / UMBILICAL / VENTRAL HERNIA REPAIR  04/13/2018   with BARD 15x 22GU mesh (supraumbilical)  . LAPAROSCOPIC LYSIS OF ADHESIONS  07/12/2018   Procedure: LAPAROSCOPIC LYSIS OF ADHESIONS;  Surgeon: Isabel Caprice, MD;  Location: [facility];  Service: Gynecology;;  . MYOMECTOMY     x 2  prior to hysterectomy  . ROBOTIC ASSISTED BILATERAL SALPINGO OOPHERECTOMY Right 07/12/2018   Procedure: XI ROBOTIC ASSISTED RIGHT SALPINGO OOPHORECTOMY;  Surgeon: Isabel Caprice, MD;  Location[facility];  Service: Gynecology;  Laterality: Right;  . TOTAL ABDOMINAL HYSTERECTOMY     fibroids   . UPPER GASTROINTESTINAL ENDOSCOPY      There were no vitals filed for this visit.   Subjective Assessment - 05/02/19 1449    Subjective  Patient reports that she has been having cervical and lumbar pain for the past several years. LBP is located across B LB with intermittent pain radiating down posterior L LE down to toes. Denies N/T or B&B dysfunction. Worse with sitting up straight, prolonged sitting, standing, walking. Better with laying on L side and slouching. Neck pain is located at the base of the neck. Intermittent N/T down to L digits 4-5. Worse with sitting up, turning her head, looking down. Denies recent falls but reports that intermittently her limbs will "lose control" and she will have to find somewhere to sit or she will fall. This occurs at least once a day. When this happens, she gets dizzy but does not feel like she will pass out- "feels blurry." This is  spontaneous and is not associated with movement.    Pertinent History  substance abuse, opioid dependence, L sided weakness, HTN, hepatitis C, GERD, depression, CKD, chronic back pain, CHF, breast CA with R lumpectomy and lymph node removal, bipolar disorder, anxiety    Limitations  Sitting;Reading;Lifting;Standing;Walking;House hold activities    How long can you sit comfortably?  <5 min    How long can you stand comfortably?  <5 min    How long can you walk comfortably?  <5 min    Diagnostic tests  04/07/19 lumbar MRI: L3-4, L4-5, and L5-S1 degenerative changes causing foraminal narrowing, with potential nerve root compression at S1 on R    Patient Stated Goals  "i want by back to get better"    Currently in Pain?  Yes    Pain Score  8     8.5   Pain Location  Back    Pain Orientation  Right;Left;Lower    Pain Descriptors / Indicators  Throbbing    Pain Type  Chronic pain    Multiple Pain Sites  Yes    Pain Location  Neck    Pain Orientation  Right;Left    Pain Descriptors / Indicators  Tender    Pain Type  Chronic pain         [facility] PT Assessment - 05/02/19 1501      Assessment   Medical Diagnosis  Dizziness, L sided weakness, gait difficulty    Referring Provider (PT)  Marcial Pacas, MD    Onset Date/Surgical Date  05/01/18    Next MD Visit  not scheduled    Prior Therapy  no      Precautions   Precautions  None      Balance Screen   Has the patient fallen in the past 6 months  Yes    How many times?  >20    Has the patient had a decrease in activity level because of a fear of falling?   Yes    Is the patient reluctant to leave their home because of a fear of falling?   Yes      [place] residence    Living Arrangements  Alone    Type of [place] to enter    [place]  One level    Bettles - 2 wheels;Walker - standard;Cane - single point      Prior Function   Level of Independence  --   has an aid that helps with bathing and cleaning   Vocation  On disability    Leisure  being able to walk      Sensation   Light Touch  Appears Intact      Coordination   Gross Motor Movements are Fluid and Coordinated  Yes      Posture/Postural Control   Posture/Postural Control  Postural limitations    Postural Limitations  Weight shift right;Posterior pelvic tilt      ROM / Strength   AROM / PROM / Strength  AROM;Strength      AROM   AROM Assessment Site  Lumbar    Lumbar Flexion  mid thigh   severe L LBP   Lumbar Extension  moderately limited   "tenderness" in back   Lumbar - Right Side Bend  mid thigh    Lumbar - Left Side Bend  proximal hip  moderate pain in LB   Lumbar - Right  Rotation  WNL    Lumbar - Left Rotation  severely limited   severe LBP     Strength   Strength Assessment Site  Hip;Knee;Ankle    Right/Left Hip  Right;Left    Right Hip Flexion  3+/5    Right Hip ABduction  4/5    Right Hip ADduction  4/5    Left Hip Flexion  4/5    Left Hip ABduction  4/5    Left Hip ADduction  4/5    Right/Left Knee  Right;Left    Right Knee Flexion  3+/5   pain in buttock   Right Knee Extension  4/5   pain in buttock   Left Knee Flexion  3+/5   pain in buttock   Left Knee Extension  4/5    Right/Left Ankle  Right;Left    Right Ankle Dorsiflexion  4/5   pain in buttok   Right Ankle Plantar Flexion  4/5    Left Ankle Dorsiflexion  4/5   pain in buttock   Left Ankle Plantar Flexion  4/5   pain in thigh     Palpation   Palpation comment  TTP along thoracolumbar spine       Ambulation/Gait   Assistive device  None    Gait Pattern  Step-to pattern;Step-through pattern;Decreased step length - left;Decreased step length - right;Trunk flexed;Lateral trunk lean to left;Lateral trunk lean to right    Ambulation Surface  Level;Indoor    Gait velocity  decreased                Objective measurements completed on examination: See above findings.      [facility] PT Treatment/Exercise - 05/02/19 1501      Modalities   Modalities  Moist Heat      Moist Heat Therapy   Number Minutes Moist Heat  3 Minutes    Moist Heat Location  Lumbar Spine             PT Education - 05/02/19 1624    Education Details  prognosis, POC, HEP    Person(s) Educated  Patient    Methods  Explanation;Demonstration;Tactile cues;Verbal cues;Handout    Comprehension  Verbalized understanding;Returned demonstration       PT Short Term Goals - 05/02/19 1647      PT SHORT TERM GOAL #1   Title  Patient to be independent with inital HEP.    Time  3    Period  Weeks    Status  New    Target Date  05/23/19        PT Long Term Goals - 05/02/19 1648      PT  LONG TERM GOAL #1   Title  Patient to be independent with advanced HEP.    Time  6    Period  Weeks    Status  New    Target Date  06/13/19      PT LONG TERM GOAL #2   Title  Patient to demonstrate atleast 4/5 strength in B LEs.    Time  6    Period  Weeks    Status  New    Target Date  06/13/19      PT LONG TERM GOAL #3   Title  Patient to demonstrate cervical and lumbar AROM WFL with mild pain remaining.    Time  6    Period  Weeks    Status  New  Target Date  06/13/19      PT LONG TERM GOAL #4   Title  Patient to report tolerance of 30 min on her feet without pain limiting.    Time  6    Period  Weeks    Status  New    Target Date  06/13/19      PT LONG TERM GOAL #5   Title  Patient to score >45/56 on Berg in order to decrease risk of falls.    Time  6    Period  Weeks    Status  New    Target Date  06/13/19             Plan - 05/02/19 1637    Clinical Impression Statement  Patient is a 63y/o F presenting to OPPT with c/o chronic cervical and LBP of several years duration. LBP occurs across B sides, with intermittent radiation down L posterior leg down to toes. Denies N/T or B&B dysfunction. Worse with sitting up straight, prolonged sitting, standing, walking. Cervical pain occurs at the base of the neck, with intermittent N/T in 4th and 5th digits. Worse with sitting up, turning her head, looking down. Patient also reports >20 falls within the last 6 months d/t spontaneous episodes of "losing control" of her limbs. Patient today demonstrating decreased B LE strength, limited and painful lumbar AROM, abnormal posture, gait deviations, and limited positional tolerance. D/t patient's inability to tolerate supine positioning today, educated patient on gentle stretching and lumbopelvic ROM HEP in sitting. Patient reported understanding. Ended session with brief application of hot pack to LB d/t pain, however patient requested to leave session. Plan to assess cervical  spine and balance within coming sessions. Patient would benefit from skilled PT services 1x/week for 6 weeks to address aforementioned impairments.    Personal Factors and Comorbidities  Age;Behavior Pattern;Comorbidity 3+;Time since onset of injury/illness/exacerbation;Fitness;Past/Current Experience    Comorbidities  substance abuse, opioid dependence, L sided weakness, HTN, hepatitis C, GERD, depression, CKD, chronic back pain, CHF, breast CA with R lumpectomy and lymph node removal, bipolar disorder, anxiety    Examination-Activity Limitations  Sit;Bed Mobility;Sleep;Bend;Squat;Caring for Others;Stairs;Carry;Stand;Toileting;Dressing;Transfers;Hygiene/Grooming;Lift;Locomotion Level;Reach Overhead    Examination-Participation Restrictions  Church;Cleaning;Shop;Community Activity;Yard Work;Interpersonal Relationship;Laundry;Meal Prep    Stability/Clinical Decision Making  Unstable/Unpredictable    Clinical Decision Making  High    Rehab Potential  Good    PT Frequency  1x / week    PT Duration  6 weeks    PT Treatment/Interventions  ADLs/Self Care Home Management;Cryotherapy;Electrical Stimulation;Moist Heat;Traction;Balance training;Therapeutic exercise;Therapeutic activities;Functional mobility training;Stair training;Gait training;Ultrasound;Neuromuscular re-education;Patient/family education;Orthotic Fit/Training;Manual techniques;Taping;Energy conservation;Dry needling;Passive range of motion    PT Next Visit Plan  reassess HEP; cervical ROM & palpation, BERG    Consulted and Agree with Plan of Care  Patient       Patient will benefit from skilled therapeutic intervention in order to improve the following deficits and impairments:  Pain, Impaired UE functional use, Decreased balance, Difficulty walking, Increased muscle spasms, Decreased range of motion, Improper body mechanics, Postural dysfunction, Impaired flexibility, Decreased strength, Increased fascial restricitons, Decreased activity  tolerance  Visit Diagnosis: Chronic bilateral low back pain with left-sided sciatica  Cervicalgia  Muscle weakness (generalized)  Difficulty in walking, not elsewhere classified  Unsteadiness on feet     Problem List Patient Active Problem List   Diagnosis Date Noted  . Bilateral low back pain with bilateral sciatica 01/05/2019  . Cyst of ovary   . Pre-operative cardiovascular examination 06/13/2018  . Dizziness  02/24/2018  . Left-sided weakness 02/24/2018  . Cigarette smoker 12/31/2017  . DOE (dyspnea on exertion) 12/30/2017  . Phlebitis after infusion 10/28/2017  . Hypertensive emergency 10/21/2017  . Acute diastolic CHF (congestive heart failure) ([place]) 10/21/2017  . Microcytic anemia 10/21/2017  . Medication intolerance 09/06/2017  . Neuroleptic-induced tardive dyskinesia 09/06/2017  . Cancer ([place]) 09/06/2017  . Chronic low back pain 09/06/2017  . Grief reaction with prolonged bereavement 09/06/2017  . Opioid use disorder, severe, dependence ([place]) 08/27/2017  . Alcohol use disorder, severe, dependence ([place]) 08/27/2017  . Substance induced mood disorder ([place]) 08/13/2017  . AKI (acute kidney injury) ([place])   . MDD (major depressive disorder), recurrent severe, without psychosis ([place])   . Alcohol withdrawal ([place]) 08/09/2017  . Essential hypertension 07/06/2017  . Chronic kidney disease, stage III (moderate) 11/17/2016  . Anxiety 08/21/2016  . Hx of bipolar disorder 01/31/2016  . History of breast cancer 02/13/2013  . Morbid obesity due to excess calories ([place]) 02/13/2013  . Hepatitis C virus infection cured after antiviral drug therapy 12/17/2012     Janene Harvey, PT, DPT 05/02/19 4:52 PM   [facility] [address]  [name], [place], [address] Phone: [phone]   Fax:  [phone]  Name: Chizaram Latino MRN: [id number] Date of Birth: August 04, 1955    PHYSICAL THERAPY DISCHARGE  SUMMARY  Visits from Start of Care: 1  Current functional level related to goals / functional outcomes: See above clinical impression; patient did not return after hospitalization    Remaining deficits: See above   Education / Equipment: HEP  Plan: Patient agrees to discharge.  Patient goals were not met. Patient is being discharged due to a change in medical status.  ?????     Janene Harvey, PT, DPT 06/05/19 4:54 PM

## 2019-05-04 ENCOUNTER — Other Ambulatory Visit: Payer: Self-pay | Admitting: Family Medicine

## 2019-05-04 NOTE — Telephone Encounter (Signed)
Requested medication (s) are due for refill today: yes  Requested medication (s) are on the active medication list: yes  Last refill:  04/10/2019  Future visit scheduled: no  Notes to clinic:  Medication was discontinued    Requested Prescriptions  Pending Prescriptions Disp Refills   labetalol (NORMODYNE) 100 MG tablet [Pharmacy Med Name: LABETALOL 100MG  TAB] 60 tablet     Sig: TAKE 1 TABLET BY MOUTH TWICE A DAY     Cardiovascular:  Beta Blockers Failed - 05/04/2019  3:04 PM      Failed - Last BP in normal range    BP Readings from Last 1 Encounters:  04/25/19 (!) 166/70         Passed - Last Heart Rate in normal range    Pulse Readings from Last 1 Encounters:  04/25/19 74         Passed - Valid encounter within last 6 months    Recent Outpatient Visits          1 week ago    Primary Care at [facility], Arlie Solomons, MD   1 month ago Essential hypertension   Primary Care at [facility], Lilia Argue, MD   3 months ago Falls frequently   Primary Care at [facility], Arlie Solomons, MD   4 months ago Abnormality of gait   Primary Care at [facility], [place], MD   4 months ago At high risk for falls   Primary Care at [facility], Arlie Solomons, MD      Future Appointments            In 4 days Revankar, Reita Cliche, MD [place]

## 2019-05-05 NOTE — Telephone Encounter (Signed)
Dr Nolon Rod I was not sure if you fill this medication or the cardiologist need to fill. Patient has an upcoming appt with cardio and this med was not on patient med list

## 2019-05-08 ENCOUNTER — Inpatient Hospital Stay (HOSPITAL_BASED_OUTPATIENT_CLINIC_OR_DEPARTMENT_OTHER)
Admission: EM | Admit: 2019-05-08 | Discharge: 2019-05-11 | DRG: 069 | Disposition: A | Discharge status: Home Health | Payer: Medicare HMO | Source: Ambulatory Visit | Attending: Family Medicine | Admitting: Family Medicine

## 2019-05-08 ENCOUNTER — Other Ambulatory Visit: Payer: Self-pay | Admitting: Family Medicine

## 2019-05-08 ENCOUNTER — Emergency Department (HOSPITAL_BASED_OUTPATIENT_CLINIC_OR_DEPARTMENT_OTHER): Payer: Medicare HMO

## 2019-05-08 ENCOUNTER — Ambulatory Visit (INDEPENDENT_AMBULATORY_CARE_PROVIDER_SITE_OTHER): Payer: Medicare HMO | Admitting: Cardiology

## 2019-05-08 ENCOUNTER — Other Ambulatory Visit: Payer: Self-pay

## 2019-05-08 ENCOUNTER — Encounter: Payer: Self-pay | Admitting: Cardiology

## 2019-05-08 ENCOUNTER — Encounter (HOSPITAL_BASED_OUTPATIENT_CLINIC_OR_DEPARTMENT_OTHER): Payer: Self-pay

## 2019-05-08 VITALS — BP 218/110 | HR 77 | Ht 62.0 in | Wt 242.1 lb

## 2019-05-08 DIAGNOSIS — I517 Cardiomegaly: Secondary | ICD-10-CM | POA: Diagnosis not present

## 2019-05-08 DIAGNOSIS — D509 Iron deficiency anemia, unspecified: Secondary | ICD-10-CM | POA: Diagnosis present

## 2019-05-08 DIAGNOSIS — F418 Other specified anxiety disorders: Secondary | ICD-10-CM | POA: Diagnosis not present

## 2019-05-08 DIAGNOSIS — B1920 Unspecified viral hepatitis C without hepatic coma: Secondary | ICD-10-CM | POA: Diagnosis present

## 2019-05-08 DIAGNOSIS — T465X6A Underdosing of other antihypertensive drugs, initial encounter: Secondary | ICD-10-CM | POA: Diagnosis present

## 2019-05-08 DIAGNOSIS — I161 Hypertensive emergency: Secondary | ICD-10-CM | POA: Diagnosis not present

## 2019-05-08 DIAGNOSIS — N1832 Chronic kidney disease, stage 3b: Secondary | ICD-10-CM | POA: Diagnosis not present

## 2019-05-08 DIAGNOSIS — G2581 Restless legs syndrome: Secondary | ICD-10-CM | POA: Diagnosis present

## 2019-05-08 DIAGNOSIS — R200 Anesthesia of skin: Secondary | ICD-10-CM

## 2019-05-08 DIAGNOSIS — Z8614 Personal history of Methicillin resistant Staphylococcus aureus infection: Secondary | ICD-10-CM

## 2019-05-08 DIAGNOSIS — F1010 Alcohol abuse, uncomplicated: Secondary | ICD-10-CM | POA: Diagnosis not present

## 2019-05-08 DIAGNOSIS — Z9114 Patient's other noncompliance with medication regimen: Secondary | ICD-10-CM

## 2019-05-08 DIAGNOSIS — R202 Paresthesia of skin: Secondary | ICD-10-CM | POA: Diagnosis not present

## 2019-05-08 DIAGNOSIS — I5032 Chronic diastolic (congestive) heart failure: Secondary | ICD-10-CM | POA: Diagnosis present

## 2019-05-08 DIAGNOSIS — G459 Transient cerebral ischemic attack, unspecified: Principal | ICD-10-CM

## 2019-05-08 DIAGNOSIS — F319 Bipolar disorder, unspecified: Secondary | ICD-10-CM | POA: Diagnosis present

## 2019-05-08 DIAGNOSIS — R531 Weakness: Secondary | ICD-10-CM | POA: Diagnosis not present

## 2019-05-08 DIAGNOSIS — J9811 Atelectasis: Secondary | ICD-10-CM | POA: Diagnosis not present

## 2019-05-08 DIAGNOSIS — F17210 Nicotine dependence, cigarettes, uncomplicated: Secondary | ICD-10-CM | POA: Diagnosis not present

## 2019-05-08 DIAGNOSIS — R110 Nausea: Secondary | ICD-10-CM

## 2019-05-08 DIAGNOSIS — Z8249 Family history of ischemic heart disease and other diseases of the circulatory system: Secondary | ICD-10-CM

## 2019-05-08 DIAGNOSIS — R0989 Other specified symptoms and signs involving the circulatory and respiratory systems: Secondary | ICD-10-CM | POA: Diagnosis not present

## 2019-05-08 DIAGNOSIS — Z803 Family history of malignant neoplasm of breast: Secondary | ICD-10-CM

## 2019-05-08 DIAGNOSIS — N183 Chronic kidney disease, stage 3 unspecified: Secondary | ICD-10-CM | POA: Diagnosis present

## 2019-05-08 DIAGNOSIS — K219 Gastro-esophageal reflux disease without esophagitis: Secondary | ICD-10-CM | POA: Diagnosis present

## 2019-05-08 DIAGNOSIS — Z853 Personal history of malignant neoplasm of breast: Secondary | ICD-10-CM | POA: Diagnosis not present

## 2019-05-08 DIAGNOSIS — Z20828 Contact with and (suspected) exposure to other viral communicable diseases: Secondary | ICD-10-CM | POA: Diagnosis present

## 2019-05-08 DIAGNOSIS — G629 Polyneuropathy, unspecified: Secondary | ICD-10-CM | POA: Diagnosis present

## 2019-05-08 DIAGNOSIS — I5042 Chronic combined systolic (congestive) and diastolic (congestive) heart failure: Secondary | ICD-10-CM | POA: Diagnosis not present

## 2019-05-08 DIAGNOSIS — R519 Headache, unspecified: Secondary | ICD-10-CM | POA: Diagnosis not present

## 2019-05-08 DIAGNOSIS — F419 Anxiety disorder, unspecified: Secondary | ICD-10-CM | POA: Diagnosis present

## 2019-05-08 DIAGNOSIS — I248 Other forms of acute ischemic heart disease: Secondary | ICD-10-CM | POA: Diagnosis not present

## 2019-05-08 DIAGNOSIS — R6889 Other general symptoms and signs: Secondary | ICD-10-CM | POA: Diagnosis not present

## 2019-05-08 DIAGNOSIS — Z79899 Other long term (current) drug therapy: Secondary | ICD-10-CM

## 2019-05-08 DIAGNOSIS — M5442 Lumbago with sciatica, left side: Secondary | ICD-10-CM

## 2019-05-08 DIAGNOSIS — G4700 Insomnia, unspecified: Secondary | ICD-10-CM | POA: Diagnosis present

## 2019-05-08 DIAGNOSIS — I130 Hypertensive heart and chronic kidney disease with heart failure and stage 1 through stage 4 chronic kidney disease, or unspecified chronic kidney disease: Secondary | ICD-10-CM | POA: Diagnosis not present

## 2019-05-08 DIAGNOSIS — Y92009 Unspecified place in unspecified non-institutional (private) residence as the place of occurrence of the external cause: Secondary | ICD-10-CM | POA: Diagnosis not present

## 2019-05-08 DIAGNOSIS — Z888 Allergy status to other drugs, medicaments and biological substances status: Secondary | ICD-10-CM | POA: Diagnosis not present

## 2019-05-08 DIAGNOSIS — I10 Essential (primary) hypertension: Secondary | ICD-10-CM | POA: Diagnosis present

## 2019-05-08 DIAGNOSIS — N184 Chronic kidney disease, stage 4 (severe): Secondary | ICD-10-CM | POA: Diagnosis not present

## 2019-05-08 DIAGNOSIS — I129 Hypertensive chronic kidney disease with stage 1 through stage 4 chronic kidney disease, or unspecified chronic kidney disease: Secondary | ICD-10-CM | POA: Diagnosis not present

## 2019-05-08 DIAGNOSIS — R109 Unspecified abdominal pain: Secondary | ICD-10-CM | POA: Diagnosis not present

## 2019-05-08 DIAGNOSIS — Z880 Allergy status to penicillin: Secondary | ICD-10-CM

## 2019-05-08 LAB — TROPONIN I (HIGH SENSITIVITY)
Troponin I (High Sensitivity): 6 ng/L (ref <18)
Troponin I (High Sensitivity): 6 ng/L (ref <18)

## 2019-05-08 LAB — CBC WITH DIFFERENTIAL/PLATELET
Abs Immature Granulocytes: 0.02 10*3/uL (ref 0.00–0.07)
Basophils Absolute: 0 10*3/uL (ref 0.0–0.1)
Basophils Relative: 0 %
Eosinophils Absolute: 0.2 10*3/uL (ref 0.0–0.5)
Eosinophils Relative: 3 %
HCT: 38 % (ref 36.0–46.0)
Hemoglobin: 11.6 g/dL — ABNORMAL LOW (ref 12.0–15.0)
Immature Granulocytes: 0 %
Lymphocytes Relative: 27 %
Lymphs Abs: 1.8 10*3/uL (ref 0.7–4.0)
MCH: 28 pg (ref 26.0–34.0)
MCHC: 30.5 g/dL (ref 30.0–36.0)
MCV: 91.6 fL (ref 80.0–100.0)
Monocytes Absolute: 0.4 10*3/uL (ref 0.1–1.0)
Monocytes Relative: 6 %
Neutro Abs: 4.2 10*3/uL (ref 1.7–7.7)
Neutrophils Relative %: 64 %
Platelets: 322 10*3/uL (ref 150–400)
RBC: 4.15 MIL/uL (ref 3.87–5.11)
RDW: 17.1 % — ABNORMAL HIGH (ref 11.5–15.5)
WBC: 6.7 10*3/uL (ref 4.0–10.5)
nRBC: 0 % (ref 0.0–0.2)

## 2019-05-08 LAB — COMPREHENSIVE METABOLIC PANEL
ALT: 11 U/L (ref 0–44)
AST: 13 U/L — ABNORMAL LOW (ref 15–41)
Albumin: 4.2 g/dL (ref 3.5–5.0)
Alkaline Phosphatase: 109 U/L (ref 38–126)
Anion gap: 10 (ref 5–15)
BUN: 28 mg/dL — ABNORMAL HIGH (ref 8–23)
CO2: 23 mmol/L (ref 22–32)
Calcium: 9.3 mg/dL (ref 8.9–10.3)
Chloride: 108 mmol/L (ref 98–111)
Creatinine, Ser: 2.13 mg/dL — ABNORMAL HIGH (ref 0.44–1.00)
GFR calc Af Amer: 28 mL/min — ABNORMAL LOW (ref >60)
GFR calc non Af Amer: 24 mL/min — ABNORMAL LOW (ref >60)
Glucose, Bld: 102 mg/dL — ABNORMAL HIGH (ref 70–99)
Potassium: 5.1 mmol/L (ref 3.5–5.1)
Sodium: 141 mmol/L (ref 135–145)
Total Bilirubin: 0.3 mg/dL (ref 0.3–1.2)
Total Protein: 7.9 g/dL (ref 6.5–8.1)

## 2019-05-08 MED ORDER — SODIUM CHLORIDE 0.9 % IV SOLN
INTRAVENOUS | Status: DC | PRN
  Administered 2019-05-08: 500 mL via INTRAVENOUS

## 2019-05-08 MED ORDER — HYDROXYZINE PAMOATE 25 MG PO CAPS
25.0000 mg | ORAL_CAPSULE | Freq: Four times a day (QID) | ORAL | Status: DC | PRN
  Filled 2019-05-08: qty 1

## 2019-05-08 MED ORDER — PROCHLORPERAZINE EDISYLATE 10 MG/2ML IJ SOLN
10.0000 mg | Freq: Once | INTRAMUSCULAR | Status: AC
  Administered 2019-05-08: 10 mg via INTRAVENOUS
  Filled 2019-05-08: qty 2

## 2019-05-08 MED ORDER — GABAPENTIN 300 MG PO CAPS
ORAL_CAPSULE | ORAL | Status: AC
  Filled 2019-05-08: qty 2

## 2019-05-08 MED ORDER — HYDROXYZINE HCL 25 MG PO TABS
ORAL_TABLET | ORAL | Status: AC
  Administered 2019-05-08: 25 mg
  Filled 2019-05-08: qty 1

## 2019-05-08 MED ORDER — MORPHINE SULFATE (PF) 4 MG/ML IV SOLN
4.0000 mg | Freq: Once | INTRAVENOUS | Status: AC
  Administered 2019-05-08: 4 mg via INTRAVENOUS
  Filled 2019-05-08: qty 1

## 2019-05-08 MED ORDER — DIPHENHYDRAMINE HCL 50 MG/ML IJ SOLN
25.0000 mg | Freq: Once | INTRAMUSCULAR | Status: AC
  Administered 2019-05-08: 25 mg via INTRAVENOUS
  Filled 2019-05-08: qty 1

## 2019-05-08 MED ORDER — FLUOXETINE HCL 20 MG PO CAPS
40.0000 mg | ORAL_CAPSULE | Freq: Every day | ORAL | Status: DC
  Administered 2019-05-09 – 2019-05-11 (×4): 40 mg via ORAL
  Filled 2019-05-08 (×4): qty 2

## 2019-05-08 MED ORDER — DEXAMETHASONE SODIUM PHOSPHATE 10 MG/ML IJ SOLN
10.0000 mg | Freq: Once | INTRAMUSCULAR | Status: AC
  Administered 2019-05-08: 10 mg via INTRAVENOUS
  Filled 2019-05-08: qty 1

## 2019-05-08 MED ORDER — METHOCARBAMOL 1000 MG/10ML IJ SOLN
1000.0000 mg | Freq: Once | INTRAMUSCULAR | Status: AC
  Administered 2019-05-08: 1000 mg via INTRAVENOUS
  Filled 2019-05-08: qty 10

## 2019-05-08 MED ORDER — HYDRALAZINE HCL 50 MG PO TABS
100.0000 mg | ORAL_TABLET | Freq: Three times a day (TID) | ORAL | Status: DC
  Administered 2019-05-09 – 2019-05-11 (×8): 100 mg via ORAL
  Filled 2019-05-08 (×8): qty 2

## 2019-05-08 MED ORDER — HYDROCHLOROTHIAZIDE 25 MG PO TABS
25.0000 mg | ORAL_TABLET | Freq: Once | ORAL | Status: AC
  Administered 2019-05-09: 25 mg via ORAL
  Filled 2019-05-08: qty 1

## 2019-05-08 MED ORDER — AMLODIPINE BESYLATE 10 MG PO TABS
10.0000 mg | ORAL_TABLET | Freq: Every day | ORAL | Status: DC
  Administered 2019-05-08 – 2019-05-11 (×4): 10 mg via ORAL
  Filled 2019-05-08 (×2): qty 1
  Filled 2019-05-08: qty 2
  Filled 2019-05-08: qty 1

## 2019-05-08 MED ORDER — LABETALOL HCL 5 MG/ML IV SOLN
10.0000 mg | Freq: Once | INTRAVENOUS | Status: AC
  Administered 2019-05-08: 10 mg via INTRAVENOUS
  Filled 2019-05-08: qty 4

## 2019-05-08 MED ORDER — HYDROXYZINE HCL 25 MG PO TABS
25.0000 mg | ORAL_TABLET | Freq: Four times a day (QID) | ORAL | Status: DC | PRN
  Administered 2019-05-09 – 2019-05-11 (×3): 25 mg via ORAL
  Filled 2019-05-08 (×3): qty 1

## 2019-05-08 MED ORDER — GABAPENTIN 600 MG PO TABS
600.0000 mg | ORAL_TABLET | Freq: Three times a day (TID) | ORAL | Status: DC
  Administered 2019-05-08 – 2019-05-10 (×6): 600 mg via ORAL
  Filled 2019-05-08 (×7): qty 1

## 2019-05-08 MED ORDER — ISOSORBIDE MONONITRATE ER 60 MG PO TB24
60.0000 mg | ORAL_TABLET | Freq: Every day | ORAL | Status: DC
  Administered 2019-05-09 – 2019-05-11 (×4): 60 mg via ORAL
  Filled 2019-05-08 (×5): qty 1

## 2019-05-08 MED ORDER — HYDROCODONE-ACETAMINOPHEN 5-325 MG PO TABS
1.0000 | ORAL_TABLET | ORAL | Status: DC | PRN
  Administered 2019-05-08 – 2019-05-09 (×3): 1 via ORAL
  Filled 2019-05-08 (×4): qty 1

## 2019-05-08 NOTE — Progress Notes (Signed)
Cardiology Office Note:    Date:  05/08/2019   ID:  Sherry [name], DOB 13-Sep-1955, MRN [id number]  PCP:  Forrest Moron, MD  Cardiologist:  Jenean Lindau, MD   Referring MD: Forrest Moron, MD    ASSESSMENT:    1. Hypertensive emergency    PLAN:    In order of problems listed above:  1. Hypertensive emergency: I discussed my findings with the patient at extensive length.  Symptoms of intensity and pain in the back are very concerning.  She has not been very compliant with medications and blood pressure follow-ups.  I told her that her initial concern at this time should be getting his blood pressure under good control especially in view of her headache and backache which are very concerning and she is agreeable and we will take her down on the wheelchair to the emergency room for evaluation and needful.  I discussed this with her at extensive length and questions were answered to her satisfaction the emergency room physician concurred with my evaluation.   Medication Adjustments/Labs and Tests Ordered: Current medicines are reviewed at length with the patient today.  Concerns regarding medicines are outlined above.  No orders of the defined types were placed in this encounter.  No orders of the defined types were placed in this encounter.    No chief complaint on file.    History of Present Illness:    Sherry [name] is a 63 y.o. female.  Patient has past medical history of uncontrolled hypertension, congestive heart failure and advanced renal insufficiency.  She came to see me in the follow-up appointment.  Her blood pressure was noted to be very elevated in the range of approximately 470 systolic.  She mentions to me that it has been elevated.  She tells me that she has taken her medications this morning.  She complains of an intense headache and on a scale of 0-10 10 being the worst she says it is at 9.  She also complained of back ache in the lower part of the back.  The  symptoms were very concerning.  She does not appear to be clinically in any distress.  I called the emergency room physician and spoke to Dr. Ralene Ok and we agreed to send the patient downstairs on wheelchair for evaluation.  Past Medical History:  Diagnosis Date  . Anxiety   . Bipolar and related disorder ([place])   . Blood transfusion without reported diagnosis   . Breast cancer ([place])    right lumpectemy and lymph node removal  . CHF (congestive heart failure) ([place])   . Chronic back pain   . Chronic kidney disease    "Stage IV" - states due to hypertension  . Depression   . GERD (gastroesophageal reflux disease)   . Hepatitis C   . Hypertension   . Left-sided weakness   . MRSA infection    of breast incision  . Opioid dependence ([place])    Has been treated in [facility] in past  . Substance abuse [facility])     Past Surgical History:  Procedure Laterality Date  . BACK SURGERY  1990  . BREAST SURGERY Right 2010   "breast cancer survivor" - states partial mastectomy and nodes  . COLONOSCOPY     [facility]  . ESOPHAGOGASTRODUODENOSCOPY     [facility]  . LAPAROSCOPIC CHOLECYSTECTOMY  2002  . LAPAROSCOPIC INCISIONAL / UMBILICAL / VENTRAL HERNIA REPAIR  04/13/2018   with BARD 15x  12AE mesh (supraumbilical)  . LAPAROSCOPIC LYSIS OF ADHESIONS  07/12/2018   Procedure: LAPAROSCOPIC LYSIS OF ADHESIONS;  Surgeon: Isabel Caprice, MD;  [facility];  Service: Gynecology;;  . MYOMECTOMY     x 2 prior to hysterectomy  . ROBOTIC ASSISTED BILATERAL SALPINGO OOPHERECTOMY Right 07/12/2018   Procedure: XI ROBOTIC ASSISTED RIGHT SALPINGO OOPHORECTOMY;  Surgeon: Isabel Caprice, MD;  [facility];  Service: Gynecology;  Laterality: Right;  . TOTAL ABDOMINAL HYSTERECTOMY     fibroids   . UPPER GASTROINTESTINAL ENDOSCOPY      Current Medications: Current Meds  Medication Sig  . ALPRAZolam (XANAX) 1 MG tablet Take 1-2 tablets thirty minutes prior to MRI.  May take  one additional tablet before entering scanner, if needed.  MUST HAVE DRIVER.  Marland Kitchen amLODipine (NORVASC) 10 MG tablet TAKE 1 TABLET (10 MG TOTAL) BY MOUTH DAILY.  . cloNIDine (CATAPRES) 0.1 MG tablet TAKE 1 TABLET (0.1 MG TOTAL) BY MOUTH 2 (TWO) TIMES DAILY.  . ferrous sulfate 325 (65 FE) MG tablet Take 1 tablet (325 mg total) by mouth daily with breakfast.  . FLUoxetine (PROZAC) 40 MG capsule 1 capsule daily.  Marland Kitchen gabapentin (NEURONTIN) 600 MG tablet Take 1 tablet (600 mg total) by mouth 3 (three) times daily.  . hydrALAZINE (APRESOLINE) 100 MG tablet Take 100 mg by mouth 3 (three) times daily.   . hydrOXYzine (VISTARIL) 25 MG capsule Take 25 mg by mouth 4 (four) times daily as needed for anxiety.  . INGREZZA 80 MG CAPS Take 80 mg by mouth daily.  . isosorbide mononitrate (IMDUR) 60 MG 24 hr tablet TAKE 1 TABLET (60 MG TOTAL) BY MOUTH DAILY.  Marland Kitchen lidocaine (LIDODERM) 5 % Place 1 patch onto the skin daily. Remove & Discard patch within 12 hours or as directed by MD  . Multiple Vitamins-Minerals (ADULT GUMMY) CHEW Chew 1 tablet by mouth daily.  . nicotine (NICODERM CQ - DOSED IN MG/24 HOURS) 21 mg/24hr patch Place 1 patch (21 mg total) onto the skin daily.  . ondansetron (ZOFRAN) 4 MG tablet TAKE 1 TABLET BY MOUTH EVERY 8 HOURS UP TO 7 DAYS AS NEEDED FOR NAUSEA & VOMITING  . oxybutynin (DITROPAN-XL) 5 MG 24 hr tablet TAKE 1 TABLET BY MOUTH AT BEDTIME  . terazosin (HYTRIN) 2 MG capsule Take 1 capsule (2 mg total) by mouth at bedtime.  . varenicline (CHANTIX CONTINUING MONTH PAK) 1 MG tablet Take 1 tablet (1 mg total) by mouth 2 (two) times daily.     Allergies:   Abilify [aripiprazole], Remeron [mirtazapine], Trazodone and nefazodone, Flexeril [cyclobenzaprine], and Amoxicillin   Social History   Socioeconomic History  . Marital status: Single    Spouse name: Not on file  . Number of children: 0  . Years of education: 73  . Highest education level: High school graduate  Occupational History  .  Occupation: Disabled  Social Needs  . Financial resource strain: Not on file  . Food insecurity    Worry: Not on file    Inability: Not on file  . Transportation needs    Medical: Not on file    Non-medical: Not on file  Tobacco Use  . Smoking status: Current Every Day Smoker    Packs/day: 0.25    Types: Cigarettes  . Smokeless tobacco: Never Used  . Tobacco comment: 4 cigs per day 01/2019  Substance and Sexual Activity  . Alcohol use: No  . Drug use: Not Currently    Comment: h/o  IVD use  . Sexual activity: Not Currently  Lifestyle  . Physical activity    Days per week: Not on file    Minutes per session: Not on file  . Stress: Not on file  Relationships  . Social Herbalist on phone: Not on file    Gets together: Not on file    Attends religious service: Not on file    Active member of club or organization: Not on file    Attends meetings of clubs or organizations: Not on file    Relationship status: Not on file  Other Topics Concern  . Not on file  Social History Narrative   Lives at home alone.   Right-handed.   Caffeine use: 4 cups coffee/soda     Family History: The patient's family history includes Breast cancer (age of onset: 103) in her mother; Cancer (age of onset: 97) in her father; Dementia in her mother; Heart attack in her mother. There is no history of Colon cancer, Esophageal cancer, Stomach cancer, or Rectal cancer.  ROS:   Please see the history of present illness.    All other systems reviewed and are negative.  EKGs/Labs/Other Studies Reviewed:    The following studies were reviewed today: As mentioned above   Recent Labs: 04/25/2019: ALT 10; BUN 32; Creatinine, Ser 2.15; Hemoglobin 10.4; Platelets 280; Potassium 5.1; Sodium 139  Recent Lipid Panel No results found for: CHOL, TRIG, HDL, CHOLHDL, VLDL, LDLCALC, LDLDIRECT  Physical Exam:    VS:  BP (!) 218/110 (BP Location: Left Arm, Patient Position: Sitting, Cuff Size: Normal)    Pulse 77   Ht 5\' 2"  (1.575 m)   Wt 242 lb 1.3 oz (109.8 kg)   SpO2 99%   BMI 44.28 kg/m     Wt Readings from Last 3 Encounters:  05/08/19 242 lb 1.3 oz (109.8 kg)  04/25/19 250 lb (113.4 kg)  04/24/19 242 lb 6.4 oz (110 kg)     GEN: Patient is in no acute distress HEENT: Normal NECK: No JVD; No carotid bruits LYMPHATICS: No lymphadenopathy CARDIAC: Hear sounds regular, 2/6 systolic murmur at the apex. RESPIRATORY:  Clear to auscultation without rales, wheezing or rhonchi  ABDOMEN: Soft, non-tender, non-distended MUSCULOSKELETAL:  No edema; No deformity  SKIN: Warm and dry NEUROLOGIC:  Alert and oriented x 3 PSYCHIATRIC:  Normal affect   Signed, Jenean Lindau, MD  05/08/2019 3:42 PM    [facility]

## 2019-05-08 NOTE — Telephone Encounter (Signed)
Requested medication (s) are due for refill today: yes  Requested medication (s) are on the active medication list: yes  Last refill:  02/14/2019  Future visit scheduled: no  Notes to clinic:  Refill cannot be delegated    Requested Prescriptions  Pending Prescriptions Disp Refills   ondansetron (ZOFRAN) 4 MG tablet [Pharmacy Med Name: ONDANSETRON 4MG  TAB] 20 tablet 0    Sig: TAKE 1 TABLET BY MOUTH EVERY 8 HOURS UP TO SEVEN DAYS AS NEEDED FOR NAUSEA & VOMITING     Not Delegated - Gastroenterology: Antiemetics Failed - 05/08/2019  9:44 AM      Failed - This refill cannot be delegated      Passed - Valid encounter within last 6 months    Recent Outpatient Visits          2 weeks ago    Primary Care at [facility], Arlie Solomons, MD   1 month ago Essential hypertension   Primary [facility] at [facility], Lilia Argue, MD   3 months ago Falls frequently   Primary Care at [facility], Arlie Solomons, MD   4 months ago Abnormality of gait   Primary Care at [facility], [place] A, MD   5 months ago At high risk for falls   Primary Care at [facility], Arlie Solomons, MD      Future Appointments            Today Revankar, Reita Cliche, MD [facility] [place]           Signed Prescriptions Disp Refills   amLODipine (NORVASC) 10 MG tablet 30 tablet 0    Sig: TAKE 1 TABLET (10 MG TOTAL) BY MOUTH DAILY.     Cardiovascular:  Calcium Channel Blockers Failed - 05/08/2019  9:44 AM      Failed - Last BP in normal range    BP Readings from Last 1 Encounters:  04/25/19 (!) 166/70         Passed - Valid encounter within last 6 months    Recent Outpatient Visits          2 weeks ago    Primary Care at [facility], Arlie Solomons, MD   1 month ago Essential hypertension   [facility] at [facility], Lilia Argue, MD   3 months ago Falls frequently   Primary Care at [facility], Arlie Solomons, MD   4 months ago Abnormality of gait   Primary Care at [facility], [place] A, MD   5 months ago At high  risk for falls   Primary Care at [facility], Arlie Solomons, MD      Future Appointments            Today Revankar, Reita Cliche, MD [facility] [place]            cloNIDine (CATAPRES) 0.1 MG tablet 60 tablet 2    Sig: TAKE 1 TABLET (0.1 MG TOTAL) BY MOUTH 2 (TWO) TIMES DAILY.     Cardiovascular:  Alpha-2 Agonists Failed - 05/08/2019  9:44 AM      Failed - Last BP in normal range    BP Readings from Last 1 Encounters:  04/25/19 (!) 166/70         Passed - Last Heart Rate in normal range    Pulse Readings from Last 1 Encounters:  04/25/19 74         Passed - Valid encounter within last 6 months    Recent Outpatient  Visits          2 weeks ago    Primary Care at [facility], Arlie Solomons, MD   1 month ago Essential hypertension   [facility] at [facility], Lilia Argue, MD   3 months ago Falls frequently   Primary Care at [facility], Arlie Solomons, MD   4 months ago Abnormality of gait   Primary Care at [facility], Arlie Solomons, MD   5 months ago At high risk for falls   Primary Care at [facility], Arlie Solomons, MD      Future Appointments            Today [place], Reita Cliche, MD [place]

## 2019-05-08 NOTE — ED Triage Notes (Addendum)
Pt states she was sent from cards office due to elevated BP-pt states she was seen by PCP 2 weeks ago for elevated BP-states neither have made changes to BP meds-NAD-steady gait

## 2019-05-08 NOTE — ED Provider Notes (Addendum)
[facility] EMERGENCY DEPARTMENT Provider Note   CSN: [id number] Arrival date & time: 05/08/19  1545     History   Chief Complaint Chief Complaint  Patient presents with  . Hypertension    HPI Katisha Shimizu is a 63 y.o. female.     HPI   63 year old female with a history of hypertension, congestive heart failure, renal insufficiency, opioid dependence, past history of breast cancer, presents with concern for hypertension, headache, lower back pain, and intermittent numbness of the left side, and was sent from her cardiologist office for further evaluation.  Took blood pressure medication at 11AM Headache throbbing Back pain throbbing, lower right side going down the back of the left leg Headache and back pain have been going on for a while, has had symptoms off and on for a while but these began again today Takes BP medications and gabapentin, although gabapentin not helping Back pain 9/10 Headache is also 9/10, hurts in back of head,  HA started slowly and worsened, nothing makes it better or worse. No falls or trauma  Is on clonidine for BP and down to taking half tablet once a day, was taking it 3 times a day but would make her dizzy/fall, so has been decreasing, now taking it at night BP normally runs 852D systolic or 782 Hydralazine 3 times daily, half a clonidine .1mg  tablet at night, amlodipine 10mg , labetalol 20mg  she believes, has been decreasing the clonidine for over a week  1 week of numbness, coming and going on the left side, BP are always high, can't tell if related.  Feels that speech is slurring at times, thought it was because of the clonidine.     Past Medical History:  Diagnosis Date  . Anxiety   . Bipolar and related disorder ([place])   . Blood transfusion without reported diagnosis   . Breast cancer ([place])    right lumpectemy and lymph node removal  . CHF (congestive heart failure) ([place])   . Chronic back pain   . Chronic kidney disease    "Stage IV" - states due to hypertension  . Depression   . GERD (gastroesophageal reflux disease)   . Hepatitis C   . Hypertension   . Left-sided weakness   . MRSA infection    of breast incision  . Opioid dependence ([place])    Has been treated in [facility] in past  . Substance abuse [facility])     Patient Active Problem List   Diagnosis Date Noted  . Hypertension 05/08/2019  . Bilateral low back pain with bilateral sciatica 01/05/2019  . Cyst of ovary   . Pre-operative cardiovascular examination 06/13/2018  . Dizziness 02/24/2018  . Left-sided weakness 02/24/2018  . Cigarette smoker 12/31/2017  . DOE (dyspnea on exertion) 12/30/2017  . Phlebitis after infusion 10/28/2017  . Hypertensive emergency 10/21/2017  . Acute diastolic CHF (congestive heart failure) ([place]) 10/21/2017  . Microcytic anemia 10/21/2017  . Medication intolerance 09/06/2017  . Neuroleptic-induced tardive dyskinesia 09/06/2017  . Cancer ([place]) 09/06/2017  . Chronic low back pain 09/06/2017  . Grief reaction with prolonged bereavement 09/06/2017  . Opioid use disorder, severe, dependence ([place]) 08/27/2017  . Alcohol use disorder, severe, dependence ([place]) 08/27/2017  . Substance induced mood disorder ([place]) 08/13/2017  . AKI (acute kidney injury) ([place])   . MDD (major depressive disorder), recurrent severe, without psychosis ([place])   . Alcohol withdrawal ([place]) 08/09/2017  . Essential hypertension 07/06/2017  . Chronic kidney disease, stage III (moderate) 11/17/2016  .  Anxiety 08/21/2016  . Hx of bipolar disorder 01/31/2016  . History of breast cancer 02/13/2013  . Morbid obesity due to excess calories ([place]) 02/13/2013  . Hepatitis C virus infection cured after antiviral drug therapy 12/17/2012    Past Surgical History:  Procedure Laterality Date  . BACK SURGERY  1990  . BREAST SURGERY Right 2010   "breast cancer survivor" - states partial mastectomy and nodes  . COLONOSCOPY     [facility]  .  ESOPHAGOGASTRODUODENOSCOPY     [facility]  . LAPAROSCOPIC CHOLECYSTECTOMY  2002  . LAPAROSCOPIC INCISIONAL / UMBILICAL / VENTRAL HERNIA REPAIR  04/13/2018   with BARD 15x 77OE mesh (supraumbilical)  . LAPAROSCOPIC LYSIS OF ADHESIONS  07/12/2018   Procedure: LAPAROSCOPIC LYSIS OF ADHESIONS;  Surgeon: Isabel Caprice, MD;  Location: [facility]S;  Service: Gynecology;;  . MYOMECTOMY     x 2 prior to hysterectomy  . ROBOTIC ASSISTED BILATERAL SALPINGO OOPHERECTOMY Right 07/12/2018   Procedure: XI ROBOTIC ASSISTED RIGHT SALPINGO OOPHORECTOMY;  Surgeon: Isabel Caprice, MD;  Location: [facility];  Service: Gynecology;  Laterality: Right;  . TOTAL ABDOMINAL HYSTERECTOMY     fibroids   . UPPER GASTROINTESTINAL ENDOSCOPY       OB History   No obstetric history on file.      Home Medications    Prior to Admission medications   Medication Sig Start Date End Date Taking? Authorizing Provider  ALPRAZolam Duanne Moron) 1 MG tablet Take 1-2 tablets thirty minutes prior to MRI.  May take one additional tablet before entering scanner, if needed.  MUST HAVE DRIVER. [birth year]   Marcial Pacas, MD  amLODipine (NORVASC) 10 MG tablet TAKE 1 TABLET (10 MG TOTAL) BY MOUTH DAILY. 05/08/19   Forrest Moron, MD  cloNIDine (CATAPRES) 0.1 MG tablet TAKE 1 TABLET (0.1 MG TOTAL) BY MOUTH 2 (TWO) TIMES DAILY. 05/08/19   Forrest Moron, MD  ferrous sulfate 325 (65 FE) MG tablet Take 1 tablet (325 mg total) by mouth daily with breakfast. 10/27/18   Forrest Moron, MD  FLUoxetine (PROZAC) 40 MG capsule 1 capsule daily. 11/09/18   [provider]  gabapentin (NEURONTIN) 600 MG tablet Take 1 tablet (600 mg total) by mouth 3 (three) times daily. 04/24/19   Forrest Moron, MD  hydrALAZINE (APRESOLINE) 100 MG tablet Take 100 mg by mouth 3 (three) times daily.  04/26/18   [provider]  hydrOXYzine (VISTARIL) 25 MG capsule Take 25 mg by mouth 4 (four) times daily as needed for anxiety. 12/26/18   [provider]  INGREZZA 80 MG CAPS Take 80 mg by mouth daily. 10/15/17   [provider]  isosorbide mononitrate (IMDUR) 60 MG 24 hr tablet TAKE 1 TABLET (60 MG TOTAL) BY MOUTH DAILY. 12/27/18   Forrest Moron, MD  lidocaine (LIDODERM) 5 % Place 1 patch onto the skin daily. Remove & Discard patch within 12 hours or as directed by MD 04/25/19   Eustaquio Maize, PA-C  Multiple Vitamins-Minerals (ADULT GUMMY) CHEW Chew 1 tablet by mouth daily. 07/21/17   [provider]  nicotine (NICODERM CQ - DOSED IN MG/24 HOURS) 21 mg/24hr patch Place 1 patch (21 mg total) onto the skin daily. 12/28/18   Delia Chimes A, MD  ondansetron (ZOFRAN) 4 MG tablet TAKE 1 TABLET BY MOUTH EVERY 8 HOURS UP TO 7 DAYS AS NEEDED FOR NAUSEA & VOMITING 02/10/19   Delia Chimes A, MD  oxybutynin (DITROPAN-XL) 5 MG 24  hr tablet TAKE 1 TABLET BY MOUTH AT BEDTIME 12/27/18   Delia Chimes A, MD  pantoprazole (PROTONIX) 40 MG tablet Take 1 tablet (40 mg total) by mouth 2 (two) times daily. 01/25/19 03/23/19  Cirigliano, Vito V, DO  terazosin (HYTRIN) 2 MG capsule Take 1 capsule (2 mg total) by mouth at bedtime. 10/27/18   Forrest Moron, MD  varenicline (CHANTIX CONTINUING MONTH PAK) 1 MG tablet Take 1 tablet (1 mg total) by mouth 2 (two) times daily. 01/02/19   Forrest Moron, MD    Family History Family History  Problem Relation Age of Onset  . Heart attack Mother   . Breast cancer Mother 70  . Dementia Mother   . Cancer Father 41       died of bleeding from kidneys  . Colon cancer Neg Hx   . Esophageal cancer Neg Hx   . Stomach cancer Neg Hx   . Rectal cancer Neg Hx     Social History Social History   Tobacco Use  . Smoking status: Current Every Day Smoker    Packs/day: 0.25    Types: Cigarettes  . Smokeless tobacco: Never Used  . Tobacco comment: 4 cigs per day 01/2019  Substance Use Topics  . Alcohol use: No  . Drug use: Not Currently    Comment: h/o IVD use     Allergies   Abilify  [aripiprazole], Remeron [mirtazapine], Trazodone and nefazodone, Flexeril [cyclobenzaprine], and Amoxicillin   Review of Systems Review of Systems  Constitutional: Negative for fever.  Eyes: Negative for visual disturbance.  Respiratory: Negative for cough and shortness of breath.   Cardiovascular: Positive for chest pain.  Gastrointestinal: Negative for abdominal pain, nausea and vomiting.  Genitourinary: Negative for difficulty urinating.  Musculoskeletal: Positive for back pain.  Skin: Negative for rash.  Neurological: Positive for speech difficulty, numbness and headaches. Negative for syncope, facial asymmetry and weakness.     Physical Exam Updated Vital Signs BP (!) 189/65   Pulse 73   Temp 98.9 F (37.2 C) (Oral)   Resp 18   Ht 5\' 2"  (1.575 m)   Wt 109.8 kg   SpO2 99%   BMI 44.26 kg/m   Physical Exam Vitals signs and nursing note reviewed.  Constitutional:      General: She is not in acute distress.    Appearance: She is well-developed. She is not diaphoretic.  HENT:     Head: Normocephalic and atraumatic.  Eyes:     General: Visual field deficit (upper bilateral visual field) present.     Conjunctiva/sclera: Conjunctivae normal.  Neck:     Musculoskeletal: Normal range of motion.  Cardiovascular:     Rate and Rhythm: Normal rate and regular rhythm.     Heart sounds: Normal heart sounds. No murmur. No friction rub. No gallop.   Pulmonary:     Effort: Pulmonary effort is normal. No respiratory distress.     Breath sounds: Normal breath sounds. No wheezing or rales.  Abdominal:     General: There is no distension.     Palpations: Abdomen is soft.     Tenderness: There is no abdominal tenderness. There is no guarding.  Musculoskeletal:        General: No tenderness.  Skin:    General: Skin is warm and dry.     Findings: No erythema or rash.  Neurological:     Mental Status: She is alert and oriented to person, place, and time.  GCS: GCS eye subscore  is 4. GCS verbal subscore is 5. GCS motor subscore is 6.     Cranial Nerves: No cranial nerve deficit, dysarthria or facial asymmetry.     Sensory: Sensation is intact. No sensory deficit.     Motor: Motor function is intact. No weakness.     Coordination: Coordination normal. Finger-Nose-Finger Test normal.      ED Treatments / Results  Labs (all labs ordered are listed, but only abnormal results are displayed) Labs Reviewed  CBC WITH DIFFERENTIAL/PLATELET - Abnormal; Notable for the following components:      Result Value   Hemoglobin 11.6 (*)    RDW 17.1 (*)    All other components within normal limits  COMPREHENSIVE METABOLIC PANEL - Abnormal; Notable for the following components:   Glucose, Bld 102 (*)    BUN 28 (*)    Creatinine, Ser 2.13 (*)    AST 13 (*)    GFR calc non Af Amer 24 (*)    GFR calc Af Amer 28 (*)    All other components within normal limits  SARS CORONAVIRUS 2 (TAT 6-24 HRS)  TROPONIN I (HIGH SENSITIVITY)  TROPONIN I (HIGH SENSITIVITY)    EKG None  Radiology Dg Chest 2 View  Result Date: 05/08/2019 CLINICAL DATA:  Back pain and hypertension EXAM: CHEST - 2 VIEW COMPARISON:  04/25/2019 FINDINGS: Heart size is enlarged. There is atelectasis at the lung bases. The lung volumes are somewhat low. There is no pneumothorax. No large pleural effusion. There is some mild vascular congestion. IMPRESSION: Cardiomegaly with mild vascular congestion. Electronically Signed   By: Constance Holster M.D.   On: 05/08/2019 18:15   Ct Head Wo Contrast  Result Date: 05/08/2019 CLINICAL DATA:  Headache, LEFT-sided numbness intermittently, history uncontrolled hypertension, CHF, advanced renal insufficiency, breast cancer EXAM: CT HEAD WITHOUT CONTRAST TECHNIQUE: Contiguous axial images were obtained from the base of the skull through the vertex without intravenous contrast. Sagittal and coronal MPR images reconstructed from axial data set. COMPARISON:  09/03/2018  FINDINGS: Brain: Normal ventricular morphology. No midline shift or mass effect. Small vessel chronic ischemic changes of deep cerebral white matter. No intracranial hemorrhage, mass lesion, or evidence of acute infarction. No extra-axial fluid collections. Vascular: No hyperdense vessels. Atherosclerotic calcification of internal carotid arteries at skull base. Skull: Intact Sinuses/Orbits: Clear Other: N/A IMPRESSION: Small vessel chronic ischemic changes of deep cerebral white matter. No acute intracranial abnormalities. Electronically Signed   By: Lavonia Dana M.D.   On: 05/08/2019 18:03    Procedures .Critical Care Performed by: Gareth Morgan, MD Authorized by: Gareth Morgan, MD   Critical care provider statement:    Critical care time (minutes):  40   Critical care was necessary to treat or prevent imminent or life-threatening deterioration of the following conditions:  CNS failure or compromise   Critical care was time spent personally by me on the following activities:  Discussions with consultants, evaluation of patient's response to treatment, examination of patient, ordering and performing treatments and interventions, ordering and review of laboratory studies, ordering and review of radiographic studies, pulse oximetry, re-evaluation of patient's condition, obtaining history from patient or surrogate and review of old charts   (including critical care time)  Medications Ordered in ED Medications  0.9 %  sodium chloride infusion (500 mLs Intravenous New Bag/Given 05/08/19 1723)  amLODipine (NORVASC) tablet 10 mg (10 mg Oral Given 05/08/19 1942)  FLUoxetine (PROZAC) capsule 40 mg (has no administration in time range)  hydrALAZINE (APRESOLINE) tablet 100 mg (has no administration in time range)  hydrOXYzine (VISTARIL) capsule 25 mg (has no administration in time range)  isosorbide mononitrate (IMDUR) 24 hr tablet 60 mg (has no administration in time range)   HYDROcodone-acetaminophen (NORCO/VICODIN) 5-325 MG per tablet 1 tablet (1 tablet Oral Given 05/08/19 1942)  gabapentin (NEURONTIN) tablet 600 mg (has no administration in time range)  hydrochlorothiazide (HYDRODIURIL) tablet 25 mg (has no administration in time range)  labetalol (NORMODYNE) injection 10 mg (10 mg Intravenous Given 05/08/19 1723)  morphine 4 MG/ML injection 4 mg (4 mg Intravenous Given 05/08/19 1729)  prochlorperazine (COMPAZINE) injection 10 mg (10 mg Intravenous Given 05/08/19 1726)  diphenhydrAMINE (BENADRYL) injection 25 mg (25 mg Intravenous Given 05/08/19 1732)  methocarbamol (ROBAXIN) injection 1,000 mg (1,000 mg Intravenous Given 05/08/19 1942)  dexamethasone (DECADRON) injection 10 mg (10 mg Intravenous Given 05/08/19 1943)  hydrOXYzine (ATARAX/VISTARIL) 25 MG tablet (25 mg  Given 05/08/19 1942)     Initial Impression / Assessment and Plan / ED Course  I have reviewed the triage vital signs and the nursing notes.  Pertinent labs & imaging results that were available during my care of the patient were reviewed by me and considered in my medical decision making (see chart for details).        63 year old female with a history of hypertension, congestive heart failure, renal insufficiency, opioid dependence, past history of breast cancer, presents with concern for hypertension, headache, lower back pain, and intermittent numbness of the left side, and was sent from her cardiologist office for further evaluation.  On arrival to the emergency department, patient's blood pressures are 218/110s.  The back pain she describes by history and physical exam appears to be musculoskeletal, likely disc herniation with left lower back pain with radiation down the left leg.  She does not have weakness of the left lower extremity, no numbness, no loss of control or bowel or bladder, no history of trauma, have low suspicion for cauda equina, epidural abscess, nor fracture.  Given  location of the back pain, exacerbating, relieving factors, and radiation, with normal bilateral lower extremity pulses, have low suspicion for aortic dissection or acute limb ischemia.  While I do not think her back pain is a complication of her hypertension, I feel that it has likely exacerbated the degree of hypertension she has.    She reports intermittent CP, not consistent with dissection by hx, not currently present. EKG and troponin WNL.  EKG not opening in MUSE however reviewed and shows no signs of acute ischemia.  Her hypertension is likely secondary to patient decreasing doses of clonidine due to unwanted side effects, as well as back pain.  Her CT head shows no acute findings, no intracranial hemorrhage.  Her neurologic exam at this time is normal with exception of upper visual field deficit, which was noted on exam, but no visual changes reported on history.  She is describing intermittent episodes of left-sided numbness, and although she does not have it at this time, her symptoms of intermittent numbness and increased blood pressures are concerning for TIA or hypertensive urgency or emergency.  She was given labetalol in the emergency department with improvement of her blood pressures.  Discussed with Dr. Leonel Ramsay of neurology, who agrees with admission for further evaluation.  Discussed with patient that she may be waiting for a bed for some time.    Ordered home medications that are available at [facility], in addition to as needed blood  pressure medications for hypertensive urgency, and pain medications for lower back pain.  I will also start her on hctz.   Discussed with patient that after her hospitalization, she will require follow-up with her primary care physician and possibly spinal doctors for her back to pain. Reasonable to do steroids as outpt, short course of narcotic in addition to muscle relaxant and PCP follow up.   Final Clinical Impressions(s) / ED  Diagnoses   Final diagnoses:  Hypertensive emergency  Numbness and tingling of left arm and leg  Acute left-sided low back pain with left-sided sciatica    ED Discharge Orders    None       Gareth Morgan, MD 05/08/19 2031    Gareth Morgan, MD 05/17/19 1200

## 2019-05-09 ENCOUNTER — Observation Stay (HOSPITAL_COMMUNITY): Payer: Medicare HMO

## 2019-05-09 ENCOUNTER — Observation Stay (HOSPITAL_BASED_OUTPATIENT_CLINIC_OR_DEPARTMENT_OTHER): Payer: Medicare HMO

## 2019-05-09 DIAGNOSIS — B1920 Unspecified viral hepatitis C without hepatic coma: Secondary | ICD-10-CM | POA: Diagnosis present

## 2019-05-09 DIAGNOSIS — N1832 Chronic kidney disease, stage 3b: Secondary | ICD-10-CM

## 2019-05-09 DIAGNOSIS — K219 Gastro-esophageal reflux disease without esophagitis: Secondary | ICD-10-CM | POA: Diagnosis present

## 2019-05-09 DIAGNOSIS — G459 Transient cerebral ischemic attack, unspecified: Secondary | ICD-10-CM

## 2019-05-09 DIAGNOSIS — M5442 Lumbago with sciatica, left side: Secondary | ICD-10-CM | POA: Diagnosis present

## 2019-05-09 DIAGNOSIS — I161 Hypertensive emergency: Secondary | ICD-10-CM | POA: Diagnosis present

## 2019-05-09 DIAGNOSIS — F419 Anxiety disorder, unspecified: Secondary | ICD-10-CM | POA: Diagnosis present

## 2019-05-09 DIAGNOSIS — Z20828 Contact with and (suspected) exposure to other viral communicable diseases: Secondary | ICD-10-CM | POA: Diagnosis present

## 2019-05-09 DIAGNOSIS — I10 Essential (primary) hypertension: Secondary | ICD-10-CM | POA: Diagnosis not present

## 2019-05-09 DIAGNOSIS — F418 Other specified anxiety disorders: Secondary | ICD-10-CM | POA: Diagnosis not present

## 2019-05-09 DIAGNOSIS — N184 Chronic kidney disease, stage 4 (severe): Secondary | ICD-10-CM | POA: Diagnosis not present

## 2019-05-09 DIAGNOSIS — F1010 Alcohol abuse, uncomplicated: Secondary | ICD-10-CM | POA: Diagnosis not present

## 2019-05-09 DIAGNOSIS — Z9114 Patient's other noncompliance with medication regimen: Secondary | ICD-10-CM | POA: Diagnosis not present

## 2019-05-09 DIAGNOSIS — I130 Hypertensive heart and chronic kidney disease with heart failure and stage 1 through stage 4 chronic kidney disease, or unspecified chronic kidney disease: Secondary | ICD-10-CM | POA: Diagnosis present

## 2019-05-09 DIAGNOSIS — F319 Bipolar disorder, unspecified: Secondary | ICD-10-CM | POA: Diagnosis present

## 2019-05-09 DIAGNOSIS — F17210 Nicotine dependence, cigarettes, uncomplicated: Secondary | ICD-10-CM | POA: Diagnosis present

## 2019-05-09 DIAGNOSIS — I129 Hypertensive chronic kidney disease with stage 1 through stage 4 chronic kidney disease, or unspecified chronic kidney disease: Secondary | ICD-10-CM | POA: Diagnosis not present

## 2019-05-09 DIAGNOSIS — I5032 Chronic diastolic (congestive) heart failure: Secondary | ICD-10-CM | POA: Diagnosis present

## 2019-05-09 DIAGNOSIS — R109 Unspecified abdominal pain: Secondary | ICD-10-CM | POA: Diagnosis not present

## 2019-05-09 DIAGNOSIS — R531 Weakness: Secondary | ICD-10-CM | POA: Diagnosis not present

## 2019-05-09 DIAGNOSIS — G2581 Restless legs syndrome: Secondary | ICD-10-CM | POA: Diagnosis present

## 2019-05-09 DIAGNOSIS — G4700 Insomnia, unspecified: Secondary | ICD-10-CM | POA: Diagnosis present

## 2019-05-09 DIAGNOSIS — Z8614 Personal history of Methicillin resistant Staphylococcus aureus infection: Secondary | ICD-10-CM | POA: Diagnosis not present

## 2019-05-09 DIAGNOSIS — I248 Other forms of acute ischemic heart disease: Secondary | ICD-10-CM | POA: Diagnosis present

## 2019-05-09 DIAGNOSIS — Z79899 Other long term (current) drug therapy: Secondary | ICD-10-CM | POA: Diagnosis not present

## 2019-05-09 DIAGNOSIS — G629 Polyneuropathy, unspecified: Secondary | ICD-10-CM | POA: Diagnosis present

## 2019-05-09 DIAGNOSIS — Z888 Allergy status to other drugs, medicaments and biological substances status: Secondary | ICD-10-CM | POA: Diagnosis not present

## 2019-05-09 DIAGNOSIS — I5042 Chronic combined systolic (congestive) and diastolic (congestive) heart failure: Secondary | ICD-10-CM | POA: Diagnosis present

## 2019-05-09 DIAGNOSIS — Y92009 Unspecified place in unspecified non-institutional (private) residence as the place of occurrence of the external cause: Secondary | ICD-10-CM | POA: Diagnosis not present

## 2019-05-09 DIAGNOSIS — Z853 Personal history of malignant neoplasm of breast: Secondary | ICD-10-CM | POA: Diagnosis not present

## 2019-05-09 DIAGNOSIS — D509 Iron deficiency anemia, unspecified: Secondary | ICD-10-CM | POA: Diagnosis present

## 2019-05-09 DIAGNOSIS — T465X6A Underdosing of other antihypertensive drugs, initial encounter: Secondary | ICD-10-CM | POA: Diagnosis present

## 2019-05-09 HISTORY — DX: Chronic diastolic (congestive) heart failure: I50.32

## 2019-05-09 HISTORY — DX: Alcohol abuse, uncomplicated: F10.10

## 2019-05-09 LAB — LIPID PANEL
Cholesterol: 266 mg/dL — ABNORMAL HIGH (ref 0–200)
HDL: 55 mg/dL (ref >40)
LDL Cholesterol: 188 mg/dL — ABNORMAL HIGH (ref 0–99)
Total CHOL/HDL Ratio: 4.8 RATIO
Triglycerides: 117 mg/dL (ref <150)
VLDL: 23 mg/dL (ref 0–40)

## 2019-05-09 LAB — ECHOCARDIOGRAM COMPLETE
Height: 62 in
Weight: 3872 oz

## 2019-05-09 LAB — HEMOGLOBIN A1C
Hgb A1c MFr Bld: 5.4 % (ref 4.8–5.6)
Mean Plasma Glucose: 108.28 mg/dL

## 2019-05-09 LAB — SARS CORONAVIRUS 2 (TAT 6-24 HRS): SARS Coronavirus 2: NEGATIVE

## 2019-05-09 LAB — BRAIN NATRIURETIC PEPTIDE: B Natriuretic Peptide: 73.4 pg/mL (ref 0.0–100.0)

## 2019-05-09 LAB — HIV ANTIBODY (ROUTINE TESTING W REFLEX): HIV Screen 4th Generation wRfx: NONREACTIVE

## 2019-05-09 MED ORDER — FERROUS SULFATE 325 (65 FE) MG PO TABS
325.0000 mg | ORAL_TABLET | Freq: Every day | ORAL | Status: DC
  Administered 2019-05-09 – 2019-05-11 (×3): 325 mg via ORAL
  Filled 2019-05-09 (×3): qty 1

## 2019-05-09 MED ORDER — VARENICLINE TARTRATE 1 MG PO TABS
1.0000 mg | ORAL_TABLET | Freq: Two times a day (BID) | ORAL | Status: DC
  Administered 2019-05-09 – 2019-05-11 (×4): 1 mg via ORAL
  Filled 2019-05-09 (×5): qty 1

## 2019-05-09 MED ORDER — CLONIDINE HCL 0.1 MG PO TABS
0.1000 mg | ORAL_TABLET | Freq: Three times a day (TID) | ORAL | Status: DC
  Administered 2019-05-09: 0.1 mg via ORAL
  Filled 2019-05-09: qty 1

## 2019-05-09 MED ORDER — FUROSEMIDE 20 MG PO TABS
20.0000 mg | ORAL_TABLET | Freq: Every day | ORAL | Status: DC
  Administered 2019-05-09 – 2019-05-11 (×3): 20 mg via ORAL
  Filled 2019-05-09 (×3): qty 1

## 2019-05-09 MED ORDER — CLONIDINE HCL 0.2 MG/24HR TD PTWK
0.2000 mg | MEDICATED_PATCH | TRANSDERMAL | Status: DC
  Filled 2019-05-09: qty 1

## 2019-05-09 MED ORDER — TERAZOSIN HCL 1 MG PO CAPS
2.0000 mg | ORAL_CAPSULE | Freq: Every day | ORAL | Status: DC
  Administered 2019-05-09: 2 mg via ORAL
  Filled 2019-05-09: qty 1
  Filled 2019-05-09: qty 2

## 2019-05-09 MED ORDER — LABETALOL HCL 5 MG/ML IV SOLN
10.0000 mg | INTRAVENOUS | Status: DC | PRN
  Administered 2019-05-09: 10 mg via INTRAVENOUS
  Filled 2019-05-09: qty 4

## 2019-05-09 MED ORDER — VALBENAZINE TOSYLATE 80 MG PO CAPS: 80.0000 mg | ORAL_CAPSULE | Freq: Every day | ORAL | Status: DC

## 2019-05-09 MED ORDER — ADULT MULTIVITAMIN W/MINERALS CH
1.0000 | ORAL_TABLET | Freq: Every day | ORAL | Status: DC
  Administered 2019-05-09 – 2019-05-11 (×3): 1 via ORAL
  Filled 2019-05-09 (×3): qty 1

## 2019-05-09 MED ORDER — ATORVASTATIN CALCIUM 10 MG PO TABS
20.0000 mg | ORAL_TABLET | Freq: Every day | ORAL | Status: DC
  Administered 2019-05-09: 20 mg via ORAL
  Filled 2019-05-09: qty 2

## 2019-05-09 MED ORDER — NICOTINE 21 MG/24HR TD PT24
21.0000 mg | MEDICATED_PATCH | Freq: Every day | TRANSDERMAL | Status: DC
  Administered 2019-05-09 – 2019-05-11 (×3): 21 mg via TRANSDERMAL
  Filled 2019-05-09 (×3): qty 1

## 2019-05-09 MED ORDER — LIDOCAINE 5 % EX PTCH
1.0000 | MEDICATED_PATCH | CUTANEOUS | Status: DC
  Filled 2019-05-09 (×3): qty 1

## 2019-05-09 MED ORDER — ASPIRIN 81 MG PO CHEW
81.0000 mg | CHEWABLE_TABLET | Freq: Every day | ORAL | Status: DC
  Administered 2019-05-09 – 2019-05-11 (×3): 81 mg via ORAL
  Filled 2019-05-09 (×3): qty 1

## 2019-05-09 MED ORDER — ENOXAPARIN SODIUM 40 MG/0.4ML ~~LOC~~ SOLN
40.0000 mg | SUBCUTANEOUS | Status: DC
  Administered 2019-05-09 – 2019-05-11 (×3): 40 mg via SUBCUTANEOUS
  Filled 2019-05-09 (×3): qty 0.4

## 2019-05-09 MED ORDER — ZOLPIDEM TARTRATE 5 MG PO TABS
5.0000 mg | ORAL_TABLET | Freq: Every evening | ORAL | Status: DC | PRN
  Administered 2019-05-09 – 2019-05-10 (×2): 5 mg via ORAL
  Filled 2019-05-09 (×2): qty 1

## 2019-05-09 MED ORDER — LORAZEPAM 2 MG/ML IJ SOLN
1.0000 mg | Freq: Once | INTRAMUSCULAR | Status: AC
  Administered 2019-05-09: 1 mg via INTRAVENOUS
  Filled 2019-05-09: qty 1

## 2019-05-09 MED ORDER — OXYCODONE HCL 5 MG PO TABS
5.0000 mg | ORAL_TABLET | ORAL | Status: DC | PRN
  Administered 2019-05-09 – 2019-05-11 (×9): 5 mg via ORAL
  Filled 2019-05-09 (×10): qty 1

## 2019-05-09 MED ORDER — LABETALOL HCL 5 MG/ML IV SOLN
10.0000 mg | Freq: Once | INTRAVENOUS | Status: AC
  Administered 2019-05-09: 10 mg via INTRAVENOUS
  Filled 2019-05-09: qty 4

## 2019-05-09 MED ORDER — PANTOPRAZOLE SODIUM 40 MG PO TBEC
40.0000 mg | DELAYED_RELEASE_TABLET | Freq: Two times a day (BID) | ORAL | Status: DC
  Administered 2019-05-09 – 2019-05-11 (×5): 40 mg via ORAL
  Filled 2019-05-09 (×5): qty 1

## 2019-05-09 MED ORDER — TERAZOSIN HCL 1 MG PO CAPS
2.0000 mg | ORAL_CAPSULE | Freq: Every day | ORAL | Status: DC
  Administered 2019-05-09: 2 mg via ORAL
  Filled 2019-05-09: qty 1

## 2019-05-09 MED ORDER — OXYBUTYNIN CHLORIDE ER 5 MG PO TB24
5.0000 mg | ORAL_TABLET | Freq: Every day | ORAL | Status: DC
  Administered 2019-05-09 – 2019-05-10 (×3): 5 mg via ORAL
  Filled 2019-05-09 (×3): qty 1

## 2019-05-09 MED ORDER — ACETAMINOPHEN 325 MG PO TABS
650.0000 mg | ORAL_TABLET | Freq: Three times a day (TID) | ORAL | Status: DC
  Administered 2019-05-09 – 2019-05-11 (×6): 650 mg via ORAL
  Filled 2019-05-09 (×6): qty 2

## 2019-05-09 MED ORDER — ROPINIROLE HCL ER 4 MG PO TB24
4.0000 mg | ORAL_TABLET | Freq: Every day | ORAL | Status: DC
  Administered 2019-05-09 – 2019-05-10 (×2): 4 mg via ORAL
  Filled 2019-05-09 (×2): qty 1

## 2019-05-09 MED ORDER — SENNOSIDES-DOCUSATE SODIUM 8.6-50 MG PO TABS: 1.0000 | ORAL_TABLET | Freq: Every evening | ORAL | Status: DC | PRN

## 2019-05-09 MED ORDER — CARVEDILOL 6.25 MG PO TABS
6.2500 mg | ORAL_TABLET | Freq: Two times a day (BID) | ORAL | Status: DC
  Administered 2019-05-09 (×2): 6.25 mg via ORAL
  Filled 2019-05-09 (×2): qty 1

## 2019-05-09 MED ORDER — ACETAMINOPHEN 325 MG PO TABS: 650.0000 mg | ORAL_TABLET | Freq: Four times a day (QID) | ORAL | Status: DC | PRN

## 2019-05-09 MED ORDER — STROKE: EARLY STAGES OF RECOVERY BOOK
Freq: Once | Status: AC
  Administered 2019-05-09: 03:00:00
  Filled 2019-05-09: qty 1

## 2019-05-09 NOTE — TOC Initial Note (Signed)
Transition of Care [facility]) - Initial/Assessment Note    Patient Details  Name: Sherry [name] MRN: [id number] Date of Birth: Dec 20, 1955  Transition of [facility]) CM/SW Contact:    Bartholomew Crews, RN Phone Number: [phone] 05/09/2019, 2:52 PM  Clinical Narrative:                 Chart reviewed. PTA home alone. [facility]. Benefit check completed for clonidine patch per consult - no copay. Patient has medicaid with usual copay of $3.90. TOC following for transition needs.   Expected Discharge Plan: OP Rehab Barriers to Discharge: Continued Medical Work up   Patient Goals and CMS Choice   CMS Medicare.gov Compare Post Acute Care list provided to:: Patient    Expected Discharge Plan and Services Expected Discharge Plan: OP Rehab   Discharge Planning Services: CM Consult                                          Prior Living Arrangements/Services                  Current home services: Other (comment)(outpatient rehab - [facility])    Activities of Daily Living Home Assistive Devices/Equipment: Eyeglasses, Blood pressure cuff, Cane (specify quad or straight), Walker (specify type), Scales ADL Screening (condition at time of admission) Patient's cognitive ability adequate to safely complete daily activities?: Yes Is the patient deaf or have difficulty hearing?: No Does the patient have difficulty seeing, even when wearing glasses/contacts?: No Does the patient have difficulty concentrating, remembering, or making decisions?: No Patient able to express need for assistance with ADLs?: Yes Does the patient have difficulty dressing or bathing?: No Independently performs ADLs?: Yes (appropriate for developmental age) Does the patient have difficulty walking or climbing stairs?: Yes Weakness of Legs: Both Weakness of Arms/Hands: None  Permission Sought/Granted                  Emotional Assessment              Admission diagnosis:  Hypertensive emergency [I16.1] Numbness and tingling of left arm and leg [R20.0, R20.2] Acute left-sided low back pain with left-sided sciatica [M54.42] Patient Active Problem List   Diagnosis Date Noted  . Chronic diastolic CHF (congestive heart failure) ([place]) 05/09/2019  . GERD (gastroesophageal reflux disease) 05/09/2019  . Alcohol abuse 05/09/2019  . Hypertension 05/08/2019  . Bilateral low back pain with bilateral sciatica 01/05/2019  . Cyst of ovary   . Pre-operative cardiovascular examination 06/13/2018  . Dizziness 02/24/2018  . Left-sided weakness 02/24/2018  . Cigarette smoker 12/31/2017  . DOE (dyspnea on exertion) 12/30/2017  . Phlebitis after infusion 10/28/2017  . Hypertensive emergency 10/21/2017  . Acute diastolic CHF (congestive heart failure) ([place]) 10/21/2017  . Microcytic anemia 10/21/2017  . Medication intolerance 09/06/2017  . Neuroleptic-induced tardive dyskinesia 09/06/2017  . Cancer ([place]) 09/06/2017  . Chronic low back pain 09/06/2017  . Grief reaction with prolonged bereavement 09/06/2017  . Opioid use disorder, severe, dependence ([place]) 08/27/2017  . Alcohol use disorder, severe, dependence ([place]) 08/27/2017  . Substance induced mood disorder ([place]) 08/13/2017  . AKI (acute kidney injury) ([place])   . MDD (major depressive disorder), recurrent severe, without psychosis ([place])   . Alcohol withdrawal ([place]) 08/09/2017  . Essential hypertension 07/06/2017  . Chronic kidney disease, stage III (moderate) 11/17/2016  .  Depression with anxiety 08/21/2016  . Hx of bipolar disorder 01/31/2016  . History of breast cancer 02/13/2013  . Morbid obesity due to excess calories ([place]) 02/13/2013  . Hepatitis C virus infection cured after antiviral drug therapy 12/17/2012   PCP:  Forrest Moron, MD Pharmacy:   [id number] - [place], [address] AT [place] [address] [place]  [address] Phone: [phone] Fax: [phone]  [place], [place] [address] [place] [address] Phone: [phone] Fax: [phone]     Social Determinants of Health (SDOH) Interventions    Readmission Risk Interventions No flowsheet data found.

## 2019-05-09 NOTE — Progress Notes (Signed)
PROGRESS NOTE    Sherry [name]  QGB:[id number] DOB: Nov 08, 1955 DOA: 05/08/2019 PCP: Forrest Moron, MD      Brief Narrative:  Mrs. Sherry [name] is a 63 y.o. F with dCHF, HTN, depression, BrCA s/p lumpectomy, and IDA who presented with high blood pressure from Cardiologist's office.  Also noted few days headache and left-sided weakness/numbness.  In the ER, BP 232/109 mmHg and given IV labetalol, amlodipine, HCTZ and hydralazine.  Troponin 6, CXR with congestion.         Assessment & Plan:  Hypertensive emergency BP reduced ~20% in first 24 hours, now will restart home orals, try to control over next 24-48 hours.  She reports intolerance of clonidine. -Continue amlodipine, hydralazine, Imdur, terazosin -Stop HCTZ, replace with furosemdie -Stop clonidine, start carvedilol -CM consult for clonidine patch benefits check   Left-sided weakness/paresthesias -Obtain MRI/MRA brain -Echocardiogram ordered -Carotid imaging ordered   -Lipids ordered: LDL 188 -A1c 5.4% -Aspirin held at this time due to allergy -Atrial fibrillation: not present on monitor -tPA not given because onset time unclear -Dysphagia screen ordered in ER -PT eval ordered  CKD IIIb Baseline Cr 2-2.1, stable relative to baseline  Depression/anxiety -Continue Prozac   Neuropathy -Continue gabapentin  Microcytic anemia -Continue iron   Smoking Cessation strongly recommended, modalities discussed -Continue varenicline  Chronic diastolic CHF -Start Lasix PO       DVT prophylaxis: Lovenox Code Status: FULL Family Communication:  MDM and disposition Plan: This is a no charge note.  For further details, please see H&P by my partner Dr. Hal Hope from earlier today.  The below labs and imaging reports were reviewed and summarized above.    The patient was admitted with hypertensive emergency.  At this time, continued inpatient services are reasonable and expected, given the patient's: Severity of  presentation: BP >200/110 with neurological changes, detectable troponin, co-morbid heart failure, chronic kidney disease, and need for close monitoring and blood pressure reduction in a controlled manner, as well as the high likelihood of an adverse outcome, including readmission, debility or death if the patient were to be discharged prematurely.      Objective: Vitals:   05/09/19 0430 05/09/19 1007 05/09/19 1235 05/09/19 1632  BP: (!) 156/79 (!) 183/86 (!) 170/77 (!) 164/60  Pulse: 81 70 73 70  Resp: _0 Temp: 98.1 F (36.7 C) 98 F (36.7 C)    TempSrc: Oral Oral    SpO2: 94% 97% 100% 100%  Weight:      Height:        Intake/Output Summary (Last 24 hours) at 05/09/2019 1820 Last data filed at 05/09/2019 1700 Gross per 24 hour  Intake 737.46 ml  Output --  Net 737.46 ml   Filed Weights   05/08/19 1602  Weight: 109.8 kg    Examination: The patient was seen and examined.      Data Reviewed: I have personally reviewed following labs and imaging studies:  CBC: Recent Labs  Lab 05/08/19 1711  WBC 6.7  NEUTROABS 4.2  HGB 11.6*  HCT 38.0  MCV 91.6  PLT 975   Basic Metabolic Panel: Recent Labs  Lab 05/08/19 1711  NA 141  K 5.1  CL 108  CO2 23  GLUCOSE 102*  BUN 28*  CREATININE 2.13*  CALCIUM 9.3   GFR: Estimated Creatinine Clearance: 31.6 mL/min (A) (by C-G formula based on SCr of 2.13 mg/dL (H)). Liver Function Tests: Recent Labs  Lab 05/08/19 1711  AST 13*  ALT 11  ALKPHOS 109  BILITOT 0.3  PROT 7.9  ALBUMIN 4.2   No results for input(s): LIPASE, AMYLASE in the last 168 hours. No results for input(s): AMMONIA in the last 168 hours. Coagulation Profile: No results for input(s): INR, PROTIME in the last 168 hours. Cardiac Enzymes: No results for input(s): CKTOTAL, CKMB, CKMBINDEX, TROPONINI in the last 168 hours. BNP (last 3 results) No results for input(s): PROBNP in the last 8760 hours. HbA1C: Recent Labs    05/09/19 0547   HGBA1C 5.4   CBG: No results for input(s): GLUCAP in the last 168 hours. Lipid Profile: Recent Labs    05/09/19 0547  CHOL 266*  HDL 55  LDLCALC 188*  TRIG 117  CHOLHDL 4.8   Thyroid Function Tests: No results for input(s): TSH, T4TOTAL, FREET4, T3FREE, THYROIDAB in the last 72 hours. Anemia Panel: No results for input(s): VITAMINB12, FOLATE, FERRITIN, TIBC, IRON, RETICCTPCT in the last 72 hours. Urine analysis:    Component Value Date/Time   COLORURINE YELLOW 06/27/2018 1409   APPEARANCEUR CLEAR 06/27/2018 1409   LABSPEC 1.015 06/27/2018 1409   PHURINE 5.0 06/27/2018 1409   GLUCOSEU NEGATIVE 06/27/2018 1409   HGBUR NEGATIVE 06/27/2018 1409   BILIRUBINUR NEGATIVE 06/27/2018 1409   KETONESUR NEGATIVE 06/27/2018 1409   PROTEINUR 100 (A) 06/27/2018 1409   UROBILINOGEN 0.2 01/12/2010 1941   NITRITE NEGATIVE 06/27/2018 1409   LEUKOCYTESUR NEGATIVE 06/27/2018 1409   Sepsis Labs: _0 (procalcitonin:4,lacticacidven:4)  ) Recent Results (from the past 240 hour(s))  SARS CORONAVIRUS 2 (TAT 6-24 HRS) Nasopharyngeal Nasopharyngeal Swab     Status: None   Collection Time: 05/08/19  7:52 PM   Specimen: Nasopharyngeal Swab  Result Value Ref Range Status   SARS Coronavirus 2 NEGATIVE NEGATIVE Final    Comment: (NOTE) SARS-CoV-2 target nucleic acids are NOT DETECTED. The SARS-CoV-2 RNA is generally detectable in upper and lower respiratory specimens during the acute phase of infection. Negative results do not preclude SARS-CoV-2 infection, do not rule out co-infections with other pathogens, and should not be used as the sole basis for treatment or other patient management decisions. Negative results must be combined with clinical observations, patient history, and epidemiological information. The expected result is Negative. Fact Sheet for Patients: SugarRoll.be Fact Sheet for Healthcare  Providers: https://www.woods-mathews.com/ This test is not yet approved or cleared by the [place] FDA and  has been authorized for detection and/or diagnosis of SARS-CoV-2 by FDA under an Emergency Use Authorization (EUA). This EUA will remain  in effect (meaning this test can be used) for the duration of the COVID-19 declaration under Section 56 4(b)(1) of the Act, 21 U.S.C. section 360bbb-3(b)(1), unless the authorization is terminated or revoked sooner. Performed at [facility], [place] [address] [place], [place] [address]          Radiology Studies: Dg Chest 2 View  Result Date: 05/08/2019 CLINICAL DATA:  Back pain and hypertension EXAM: CHEST - 2 VIEW COMPARISON:  04/25/2019 FINDINGS: Heart size is enlarged. There is atelectasis at the lung bases. The lung volumes are somewhat low. There is no pneumothorax. No large pleural effusion. There is some mild vascular congestion. IMPRESSION: Cardiomegaly with mild vascular congestion. Electronically Signed   By: Constance Holster M.D.   On: 05/08/2019 18:15   Ct Head Wo Contrast  Result Date: 05/08/2019 CLINICAL DATA:  Headache, LEFT-sided numbness intermittently, history uncontrolled hypertension, CHF, advanced renal insufficiency, breast cancer EXAM: CT HEAD WITHOUT CONTRAST TECHNIQUE: Contiguous axial images were obtained from the base  of the skull through the vertex without intravenous contrast. Sagittal and coronal MPR images reconstructed from axial data set. COMPARISON:  09/03/2018 FINDINGS: Brain: Normal ventricular morphology. No midline shift or mass effect. Small vessel chronic ischemic changes of deep cerebral white matter. No intracranial hemorrhage, mass lesion, or evidence of acute infarction. No extra-axial fluid collections. Vascular: No hyperdense vessels. Atherosclerotic calcification of internal carotid arteries at skull base. Skull: Intact Sinuses/Orbits: Clear Other: N/A IMPRESSION: Small vessel  chronic ischemic changes of deep cerebral white matter. No acute intracranial abnormalities. Electronically Signed   By: Lavonia Dana M.D.   On: 05/08/2019 18:03   Mr Angio Head Wo Contrast  Result Date: 05/09/2019 CLINICAL DATA:  TIA, initial exam. Headache with left-sided numbness intermittently EXAM: MRI HEAD WITHOUT CONTRAST MRA HEAD WITHOUT CONTRAST TECHNIQUE: Multiplanar, multiecho pulse sequences of the brain and surrounding structures were obtained without intravenous contrast. Angiographic images of the head were obtained using MRA technique without contrast. COMPARISON:  04/06/2019 FINDINGS: MRI HEAD FINDINGS Brain: No acute infarction, hemorrhage, hydrocephalus, extra-axial collection or mass lesion. FLAIR hyperintensity in the periventricular white matter and to a mild extent in the pons, stable from comparison and likely from chronic small vessel ischemia. Age normal brain volume Vascular: Arterial findings below. Normal dural venous sinus flow voids Skull and upper cervical spine: Voids negative for marrow lesion. Partial visualization of an ACDF plate Sinuses/Orbits: Negative Other: Motion degraded MRA HEAD FINDINGS Vertebral, basilar, and carotid arteries are patent. There is mild irregularity and narrowing at the bilateral anterior genu of the ICA, likely from flow artifact and motion. Extensive medium size vessel irregularity including high-grade bilateral M2 segment stenoses and high-grade right P1 segment stenosis. This is likely atherosclerosis accentuated by motion. No definite poststenotic beading to imply a vasculitis in this patient with headache. Fetal type left PCA. Negative for aneurysm or evident vascular malformation IMPRESSION: Brain MRI: 1. No acute finding or change from last month. 2. Mild presumed chronic small vessel ischemia. 3. Motion degraded. Intracranial MRA: Extensive irregularity of medium size vessels, likely advanced intracranial atherosclerosis. The narrowings are  not associated with beading to strongly implicate a vasculitis/vasculopathy in this patient with history of headache. The most notable high-grade narrowings involve bilateral M2 branches in the right P1 segment. Electronically Signed   By: Monte Fantasia M.D.   On: 05/09/2019 09:14   Mr Brain Wo Contrast  Result Date: 05/09/2019 CLINICAL DATA:  TIA, initial exam. Headache with left-sided numbness intermittently EXAM: MRI HEAD WITHOUT CONTRAST MRA HEAD WITHOUT CONTRAST TECHNIQUE: Multiplanar, multiecho pulse sequences of the brain and surrounding structures were obtained without intravenous contrast. Angiographic images of the head were obtained using MRA technique without contrast. COMPARISON:  04/06/2019 FINDINGS: MRI HEAD FINDINGS Brain: No acute infarction, hemorrhage, hydrocephalus, extra-axial collection or mass lesion. FLAIR hyperintensity in the periventricular white matter and to a mild extent in the pons, stable from comparison and likely from chronic small vessel ischemia. Age normal brain volume Vascular: Arterial findings below. Normal dural venous sinus flow voids Skull and upper cervical spine: Voids negative for marrow lesion. Partial visualization of an ACDF plate Sinuses/Orbits: Negative Other: Motion degraded MRA HEAD FINDINGS Vertebral, basilar, and carotid arteries are patent. There is mild irregularity and narrowing at the bilateral anterior genu of the ICA, likely from flow artifact and motion. Extensive medium size vessel irregularity including high-grade bilateral M2 segment stenoses and high-grade right P1 segment stenosis. This is likely atherosclerosis accentuated by motion. No definite poststenotic beading to imply a  vasculitis in this patient with headache. Fetal type left PCA. Negative for aneurysm or evident vascular malformation IMPRESSION: Brain MRI: 1. No acute finding or change from last month. 2. Mild presumed chronic small vessel ischemia. 3. Motion degraded. Intracranial  MRA: Extensive irregularity of medium size vessels, likely advanced intracranial atherosclerosis. The narrowings are not associated with beading to strongly implicate a vasculitis/vasculopathy in this patient with history of headache. The most notable high-grade narrowings involve bilateral M2 branches in the right P1 segment. Electronically Signed   By: Monte Fantasia M.D.   On: 05/09/2019 09:14   Vas US Carotid (at [facility] Only)  Result Date: 05/09/2019 Carotid Arterial Duplex Study Indications:       TIA. Risk Factors:      Hypertension. Limitations        Today's exam was limited due to the body habitus of the                    patient, the high bifurcation of the carotid and the                    patient's respiratory variation. Comparison Study:  No prior study. Performing Technologist: Maudry Mayhew MHA, RDMS, RVT, RDCS  Examination Guidelines: A complete evaluation includes B-mode imaging, spectral Doppler, color Doppler, and power Doppler as needed of all accessible portions of each vessel. Bilateral testing is considered an integral part of a complete examination. Limited examinations for reoccurring indications may be performed as noted.  Right Carotid Findings: +----------+--------+--------+--------+-----------------------+--------+             PSV cm/s EDV cm/s Stenosis Plaque Description      Comments  +----------+--------+--------+--------+-----------------------+--------+  CCA Prox   112      15                smooth and homogeneous            +----------+--------+--------+--------+-----------------------+--------+  CCA Distal 65       14                smooth and homogeneous            +----------+--------+--------+--------+-----------------------+--------+  ICA Prox   81       22                smooth and heterogenous           +----------+--------+--------+--------+-----------------------+--------+  ICA Distal 65       27                                                   +----------+--------+--------+--------+-----------------------+--------+  ECA        93       11                                                  +----------+--------+--------+--------+-----------------------+--------+ +----------+--------+-------+----------------+-------------------+             PSV cm/s EDV cms Describe         Arm Pressure (mmHG)  +----------+--------+-------+----------------+-------------------+  Subclavian 184              Multiphasic, WNL                      +----------+--------+-------+----------------+-------------------+ +---------+--------+--+--------+--+---------+  Vertebral PSV cm/s 52 EDV cm/s 21 Antegrade  +---------+--------+--+--------+--+---------+  Left Carotid Findings: +----------+--------+--------+--------+------------------+--------+             PSV cm/s EDV cm/s Stenosis Plaque Description Comments  +----------+--------+--------+--------+------------------+--------+  CCA Prox   120      20                                             +----------+--------+--------+--------+------------------+--------+  CCA Distal 98       26                                             +----------+--------+--------+--------+------------------+--------+  ICA Prox   117      32                                             +----------+--------+--------+--------+------------------+--------+  ICA Distal 75       24                                             +----------+--------+--------+--------+------------------+--------+  ECA        161      21                                             +----------+--------+--------+--------+------------------+--------+ +----------+--------+--------+----------------+-------------------+             PSV cm/s EDV cm/s Describe         Arm Pressure (mmHG)  +----------+--------+--------+----------------+-------------------+  Subclavian 139               Multiphasic, WNL                      +----------+--------+--------+----------------+-------------------+  +---------+--------+--+--------+--+---------+  Vertebral PSV cm/s 55 EDV cm/s 19 Antegrade  +---------+--------+--+--------+--+---------+  Summary: Right Carotid: Velocities in the right ICA are consistent with a 1-39% stenosis. Left Carotid: Velocities in the left ICA are consistent with a 1-39% stenosis. Vertebrals:  Bilateral vertebral arteries demonstrate antegrade flow. Subclavians: Normal flow hemodynamics were seen in bilateral subclavian              arteries. *See table(s) above for measurements and observations.  Electronically signed by Deitra Mayo MD on 05/09/2019 at 3:08:44 PM.    Final         Scheduled Meds:  acetaminophen  650 mg Oral TID   amLODipine  10 mg Oral Daily   aspirin  81 mg Oral Daily   atorvastatin  20 mg Oral q1800   carvedilol  6.25 mg Oral BID WC   enoxaparin (LOVENOX) injection  40 mg Subcutaneous Q24H   ferrous sulfate  325 mg Oral Q breakfast   FLUoxetine  40 mg Oral Daily   furosemide  20 mg Oral Daily   gabapentin  600 mg Oral TID   hydrALAZINE  100 mg Oral TID   isosorbide mononitrate  60 mg  Oral Daily   lidocaine  1 patch Transdermal Q24H   multivitamin with minerals  1 tablet Oral Daily   nicotine  21 mg Transdermal Daily   oxybutynin  5 mg Oral QHS   pantoprazole  40 mg Oral BID   rOPINIRole  4 mg Oral QHS   terazosin  2 mg Oral QHS   varenicline  1 mg Oral BID   Continuous Infusions:  sodium chloride 500 mL (05/08/19 1723)     LOS: 0 days    Time spent: 20 minutes    Edwin Dada, MD [facility] 05/09/2019, 6:20 PM     Pager [phone] --- please page though AMION:  [email or web address] Password [email or web address] If 7PM-7AM, please contact night-coverage

## 2019-05-09 NOTE — Progress Notes (Signed)
New Admission Note:   Arrival Method: [facility] Mental Orientation: alert orient x 4 Telemetry:  11-NSR Assessment: Completed Skin: intact IV:NSL Pain: none Tubes: none Safety Measures: Safety Fall Prevention Plan has been discussed Admission: Completed [facility] Orientation: Patient has been orientated to the room, unit and staff.  Family: none at bedside  Orders have been reviewed and implemented. Will continue to monitor the patient. Call light has been placed within reach and bed alarm has been activated.   Rockie Neighbours BSN, RN Phone number: [phone]

## 2019-05-09 NOTE — Progress Notes (Signed)
Carotid artery duplex completed. Refer to "CV Proc" under chart review to view preliminary results.  05/09/2019 2:15 PM Maudry Mayhew, MHA, RVT, RDCS, RDMS

## 2019-05-09 NOTE — Evaluation (Signed)
Occupational Therapy Evaluation Patient Details Name: Sherry [name] MRN: [id number] DOB: [birth year] Today's Date: 05/09/2019    History of Present Illness Pt is a 63 yr old female with medical history significant of hypertension, GERD, depression, anxiety, substance abuse, alcohol abuse in remission, tobacco abuse, HCV, sCHF, breast cancer (right lumpectomy), CKD-III3b, iron deficiency anemia, medication noncompliance, who presents with elevated blood pressure and a left-sided weakness and numbness. Pt reports that she has intermitent left sided weakness and numbness for about 1 weeks which she attributed to clonidine use. RN informed OT that patient's MRI came back negative for acute CVA.    Clinical Impression   Pt is a 63 yr old female who lives alone with home health aide 2 hrs daily for bathing and IADL assist. Pt reports through her insurance she receives transportation services. Pt reports that "my blood pressure medication" causes L LE numbness, L sided pain which causes her to fall. Pt reports using her cane more than her rolling walker. Educate benefits using walker/rollator to minimize risk of falls, pt verbalizes wanting a rollator for home.     Follow Up Recommendations  Home health OT    Equipment Recommendations  Tub/shower bench(rollator)    Recommendations for Other Services       Precautions / Restrictions Precautions Precautions: Fall Restrictions Weight Bearing Restrictions: No      Mobility Bed Mobility Overal bed mobility: Needs Assistance Bed Mobility: Sit to Supine       Sit to supine: Min guard      Transfers Overall transfer level: Needs assistance Equipment used: Rolling walker (2 wheeled)(pt pushes walker off to side edu pt use of walker for safety) Transfers: Sit to/from Stand Sit to Stand: Min guard              Balance Overall balance assessment: Needs assistance Sitting-balance support: No upper extremity supported;Feet  unsupported Sitting balance-Leahy Scale: Good     Standing balance support: During functional activity Standing balance-Leahy Scale: Fair                             ADL either performed or assessed with clinical judgement   ADL Overall ADL's : Needs assistance/impaired Eating/Feeding: Independent       Upper Body Bathing: Set up   Lower Body Bathing: Moderate assistance   Upper Body Dressing : Set up   Lower Body Dressing: Moderate assistance Lower Body Dressing Details (indicate cue type and reason): (seated EOB pt require A donning L sock due to LE soreness) Toilet Transfer: Min guard   Toileting- Clothing Manipulation and Hygiene: Supervision/safety       Functional mobility during ADLs: Min guard                    Pertinent Vitals/Pain Pain Assessment: 0-10 Pain Score: 8  Pain Location: (head and entire L side of body, back) Pain Descriptors / Indicators: Throbbing Pain Intervention(s): Limited activity within patient's tolerance;Monitored during session;Repositioned     Hand Dominance Right   Extremity/Trunk Assessment Upper Extremity Assessment Upper Extremity Assessment: Generalized weakness   Lower Extremity Assessment Lower Extremity Assessment: Defer to PT evaluation       Communication Communication Communication: No difficulties   Cognition Arousal/Alertness: Awake/alert Behavior During Therapy: WFL for tasks assessed/performed Overall Cognitive Status: Within Functional Limits for tasks assessed  Home Living Family/patient expects to be discharged to:: Private residence Living Arrangements: Alone Available Help at Discharge: Personal care attendant(pt has aide 2 hrs 7 days a week for bathing/IADLs) Type of Home: House Home Access: Stairs to enter [employer] of Steps: (4-5 steps) Entrance Stairs-Rails: Left;Right(cannot reach both at once) Home Layout: One  level     Bathroom Shower/Tub: Teacher, early years/pre: Standard Bathroom Accessibility: Yes How Accessible: Accessible via walker Home Equipment: [place] - 2 wheels;Cane - single point;Shower seat          Prior Functioning/Environment Level of Independence: Needs assistance  Gait / Transfers Assistance Needed: (pt reports using cane most often vs front wheeled walker) ADL's / Homemaking Assistance Needed: (bathing assist, IADL light house keeping, laundry, meal prep)   Comments: [place] aide is there daily 11-1pm        OT Problem List: Decreased strength;Decreased activity tolerance;Decreased safety awareness;Decreased knowledge of use of DME or AE;Pain      OT Treatment/Interventions: Self-care/ADL training;Therapeutic exercise;DME and/or AE instruction;Therapeutic activities;Patient/family education;Balance training    OT Goals(Current goals can be found in the care plan section) Acute Rehab OT Goals Patient Stated Goal: "not to fall" OT Goal Formulation: With patient Time For Goal Achievement: (2 weeks) Potential to Achieve Goals: Good  OT Frequency: Min 2X/week    AM-PAC OT "6 Clicks" Daily Activity     Outcome Measure Help from another person eating meals?: None Help from another person taking care of personal grooming?: A Little Help from another person toileting, which includes using toliet, bedpan, or urinal?: A Little Help from another person bathing (including washing, rinsing, drying)?: A Lot Help from another person to put on and taking off regular upper body clothing?: A Little Help from another person to put on and taking off regular lower body clothing?: A Lot 6 Click Score: 17   End of Session Equipment Utilized During Treatment: Rolling walker Nurse Communication: Mobility status;Other (comment)(pt request for drink)  Activity Tolerance: Patient limited by fatigue;Other (comment)(pt reports getting no sleep last night) Patient left: in bed;with  call bell/phone within reach  OT Visit Diagnosis: Unsteadiness on feet (R26.81);Muscle weakness (generalized) (M62.81);Repeated falls (R29.6);History of falling (Z91.81);Pain Pain - Right/Left: Left Pain - part of body: (pt reports L sided pain )                Time: 3710-6269 OT Time Calculation (min): 25 min Charges:  OT General Charges $OT Visit: 1 Visit OT Evaluation $OT Eval Moderate Complexity: 1 Mod OT Treatments $Self Care/Home Management : 8-22 mins  Delbert Phenix OT OT office: [place] 05/09/2019, 10:06 AM

## 2019-05-09 NOTE — Plan of Care (Signed)
  Problem: Health Behavior/Discharge Planning: Goal: Ability to manage health-related needs will improve Outcome: Progressing   Problem: Clinical Measurements: Goal: Diagnostic test results will improve Outcome: Progressing   Problem: Clinical Measurements: Goal: Cardiovascular complication will be avoided Outcome: Progressing   Problem: Pain Managment: Goal: General experience of comfort will improve Outcome: Progressing

## 2019-05-09 NOTE — Progress Notes (Addendum)
STROKE TEAM PROGRESS NOTE   HISTORY OF PRESENT ILLNESS (per record) Sherry [name] is a 63 y.o. female with past medical history significant for hypertension, GERD, depression, anxiety, substance abuse, alcohol abuse, HCV, CKD stage III, breast cancer, systolic heart failure, medication noncompliance who presented to [facility] after presenting first to her cardiologist office with left-sided weakness and numbness and headache in the setting of severely elevated blood pressure.   Patient on clonidine however was not taking it as recommended due to the medicine making her feel dizzy.  On examination by EDP she had intermittent left-sided numbness and upper visual field deficit.  Stat CT head was negative for acute findings.  Case was discussed by neurology and patient transferred to [facility] for further evaluation.  Date last known well: 05/07/2019 tPA Given: Outside window for TPA  INTERVAL HISTORY MRI neg for stroke, pt has many other complaints besides what she came in for. I attempted to discuss stroke risks, but she was not interested for most part. Her main complaint currently is RLS and insomnia. States h/a has improved and there is no longer left side weakness.  I have personally reviewed history of presenting illness with the patient, electronic medical records and imaging films in PACS OBJECTIVE Vitals:   05/09/19 0430 05/09/19 1007 05/09/19 1235 05/09/19 1632  BP: (!) 156/79 (!) 183/86 (!) 170/77 (!) 164/60  Pulse: 81 70 73 70  Resp: 16 18  18   Temp: 98.1 F (36.7 C) 98 F (36.7 C)    TempSrc: Oral Oral    SpO2: 94% 97% 100% 100%  Weight:      Height:        CBC:  Recent Labs  Lab 05/08/19 1711  WBC 6.7  NEUTROABS 4.2  HGB 11.6*  HCT 38.0  MCV 91.6  PLT 130    Basic Metabolic Panel:  Recent Labs  Lab 05/08/19 1711  NA 141  K 5.1  CL 108  CO2 23  GLUCOSE 102*  BUN 28*  CREATININE 2.13*  CALCIUM 9.3    Lipid Panel:     Component Value  Date/Time   CHOL 266 (H) 05/09/2019 0547   TRIG 117 05/09/2019 0547   HDL 55 05/09/2019 0547   CHOLHDL 4.8 05/09/2019 0547   VLDL 23 05/09/2019 0547   LDLCALC 188 (H) 05/09/2019 0547   HgbA1c:  Lab Results  Component Value Date   HGBA1C 5.4 05/09/2019   Urine Drug Screen:     Component Value Date/Time   LABOPIA NONE DETECTED 06/27/2018 1409   COCAINSCRNUR NONE DETECTED 06/27/2018 1409   LABBENZ NONE DETECTED 06/27/2018 1409   AMPHETMU NONE DETECTED 06/27/2018 1409   THCU NONE DETECTED 06/27/2018 1409   LABBARB NONE DETECTED 06/27/2018 1409    Alcohol Level     Component Value Date/Time   ETH <10 08/09/2017 1132    IMAGING   Dg Chest 2 View  Result Date: 05/08/2019 CLINICAL DATA:  Back pain and hypertension EXAM: CHEST - 2 VIEW COMPARISON:  04/25/2019 FINDINGS: Heart size is enlarged. There is atelectasis at the lung bases. The lung volumes are somewhat low. There is no pneumothorax. No large pleural effusion. There is some mild vascular congestion. IMPRESSION: Cardiomegaly with mild vascular congestion. Electronically Signed   By: Constance Holster M.D.   On: 05/08/2019 18:15   Ct Head Wo Contrast  Result Date: 05/08/2019 CLINICAL DATA:  Headache, LEFT-sided numbness intermittently, history uncontrolled hypertension, CHF, advanced renal insufficiency, breast cancer EXAM: CT HEAD  WITHOUT CONTRAST TECHNIQUE: Contiguous axial images were obtained from the base of the skull through the vertex without intravenous contrast. Sagittal and coronal MPR images reconstructed from axial data set. COMPARISON:  09/03/2018 FINDINGS: Brain: Normal ventricular morphology. No midline shift or mass effect. Small vessel chronic ischemic changes of deep cerebral white matter. No intracranial hemorrhage, mass lesion, or evidence of acute infarction. No extra-axial fluid collections. Vascular: No hyperdense vessels. Atherosclerotic calcification of internal carotid arteries at skull base. Skull:  Intact Sinuses/Orbits: Clear Other: N/A IMPRESSION: Small vessel chronic ischemic changes of deep cerebral white matter. No acute intracranial abnormalities. Electronically Signed   By: Lavonia Dana M.D.   On: 05/08/2019 18:03   Mr Angio Head Wo Contrast  Result Date: 05/09/2019 CLINICAL DATA:  TIA, initial exam. Headache with left-sided numbness intermittently EXAM: MRI HEAD WITHOUT CONTRAST MRA HEAD WITHOUT CONTRAST TECHNIQUE: Multiplanar, multiecho pulse sequences of the brain and surrounding structures were obtained without intravenous contrast. Angiographic images of the head were obtained using MRA technique without contrast. COMPARISON:  04/06/2019 FINDINGS: MRI HEAD FINDINGS Brain: No acute infarction, hemorrhage, hydrocephalus, extra-axial collection or mass lesion. FLAIR hyperintensity in the periventricular white matter and to a mild extent in the pons, stable from comparison and likely from chronic small vessel ischemia. Age normal brain volume Vascular: Arterial findings below. Normal dural venous sinus flow voids Skull and upper cervical spine: Voids negative for marrow lesion. Partial visualization of an ACDF plate Sinuses/Orbits: Negative Other: Motion degraded MRA HEAD FINDINGS Vertebral, basilar, and carotid arteries are patent. There is mild irregularity and narrowing at the bilateral anterior genu of the ICA, likely from flow artifact and motion. Extensive medium size vessel irregularity including high-grade bilateral M2 segment stenoses and high-grade right P1 segment stenosis. This is likely atherosclerosis accentuated by motion. No definite poststenotic beading to imply a vasculitis in this patient with headache. Fetal type left PCA. Negative for aneurysm or evident vascular malformation IMPRESSION: Brain MRI: 1. No acute finding or change from last month. 2. Mild presumed chronic small vessel ischemia. 3. Motion degraded. Intracranial MRA: Extensive irregularity of medium size vessels,  likely advanced intracranial atherosclerosis. The narrowings are not associated with beading to strongly implicate a vasculitis/vasculopathy in this patient with history of headache. The most notable high-grade narrowings involve bilateral M2 branches in the right P1 segment. Electronically Signed   By: Monte Fantasia M.D.   On: 05/09/2019 09:14   Mr Brain Wo Contrast  Result Date: 05/09/2019 CLINICAL DATA:  TIA, initial exam. Headache with left-sided numbness intermittently EXAM: MRI HEAD WITHOUT CONTRAST MRA HEAD WITHOUT CONTRAST TECHNIQUE: Multiplanar, multiecho pulse sequences of the brain and surrounding structures were obtained without intravenous contrast. Angiographic images of the head were obtained using MRA technique without contrast. COMPARISON:  04/06/2019 FINDINGS: MRI HEAD FINDINGS Brain: No acute infarction, hemorrhage, hydrocephalus, extra-axial collection or mass lesion. FLAIR hyperintensity in the periventricular white matter and to a mild extent in the pons, stable from comparison and likely from chronic small vessel ischemia. Age normal brain volume Vascular: Arterial findings below. Normal dural venous sinus flow voids Skull and upper cervical spine: Voids negative for marrow lesion. Partial visualization of an ACDF plate Sinuses/Orbits: Negative Other: Motion degraded MRA HEAD FINDINGS Vertebral, basilar, and carotid arteries are patent. There is mild irregularity and narrowing at the bilateral anterior genu of the ICA, likely from flow artifact and motion. Extensive medium size vessel irregularity including high-grade bilateral M2 segment stenoses and high-grade right P1 segment stenosis. This is likely  atherosclerosis accentuated by motion. No definite poststenotic beading to imply a vasculitis in this patient with headache. Fetal type left PCA. Negative for aneurysm or evident vascular malformation IMPRESSION: Brain MRI: 1. No acute finding or change from last month. 2. Mild presumed  chronic small vessel ischemia. 3. Motion degraded. Intracranial MRA: Extensive irregularity of medium size vessels, likely advanced intracranial atherosclerosis. The narrowings are not associated with beading to strongly implicate a vasculitis/vasculopathy in this patient with history of headache. The most notable high-grade narrowings involve bilateral M2 branches in the right P1 segment. Electronically Signed   By: Monte Fantasia M.D.   On: 05/09/2019 09:14   Vas US Carotid (at [place] Only)  Result Date: 05/09/2019 Carotid Arterial Duplex Study Indications:       TIA. Risk Factors:      Hypertension. Limitations        Today's exam was limited due to the body habitus of the                    patient, the high bifurcation of the carotid and the                    patient's respiratory variation. Comparison Study:  No prior study. Performing Technologist: Maudry Mayhew MHA, RDMS, RVT, RDCS  Examination Guidelines: A complete evaluation includes B-mode imaging, spectral Doppler, color Doppler, and power Doppler as needed of all accessible portions of each vessel. Bilateral testing is considered an integral part of a complete examination. Limited examinations for reoccurring indications may be performed as noted.  Right Carotid Findings: +----------+--------+--------+--------+-----------------------+--------+           PSV cm/sEDV cm/sStenosisPlaque Description     Comments +----------+--------+--------+--------+-----------------------+--------+ CCA Prox  112     15              smooth and homogeneous          +----------+--------+--------+--------+-----------------------+--------+ CCA Distal65      14              smooth and homogeneous          +----------+--------+--------+--------+-----------------------+--------+ ICA Prox  81      22              smooth and heterogenous         +----------+--------+--------+--------+-----------------------+--------+ ICA Distal65       27                                              +----------+--------+--------+--------+-----------------------+--------+ ECA       93      11                                              +----------+--------+--------+--------+-----------------------+--------+ +----------+--------+-------+----------------+-------------------+           PSV cm/sEDV cmsDescribe        Arm Pressure (mmHG) +----------+--------+-------+----------------+-------------------+ [id number]            Multiphasic, WNL                    +----------+--------+-------+----------------+-------------------+ +---------+--------+--+--------+--+---------+ VertebralPSV cm/s52EDV cm/s21Antegrade +---------+--------+--+--------+--+---------+  Left Carotid Findings: +----------+--------+--------+--------+------------------+--------+           PSV cm/sEDV cm/sStenosisPlaque DescriptionComments +----------+--------+--------+--------+------------------+--------+  CCA Prox  120     20                                         +----------+--------+--------+--------+------------------+--------+ CCA Distal98      26                                         +----------+--------+--------+--------+------------------+--------+ ICA Prox  117     32                                         +----------+--------+--------+--------+------------------+--------+ ICA Distal75      24                                         +----------+--------+--------+--------+------------------+--------+ ECA       161     21                                         +----------+--------+--------+--------+------------------+--------+ +----------+--------+--------+----------------+-------------------+           PSV cm/sEDV cm/sDescribe        Arm Pressure (mmHG) +----------+--------+--------+----------------+-------------------+ [id number]             Multiphasic, WNL                     +----------+--------+--------+----------------+-------------------+ +---------+--------+--+--------+--+---------+ VertebralPSV cm/s55EDV cm/s19Antegrade +---------+--------+--+--------+--+---------+  Summary: Right Carotid: Velocities in the right ICA are consistent with a 1-39% stenosis. Left Carotid: Velocities in the left ICA are consistent with a 1-39% stenosis. Vertebrals:  Bilateral vertebral arteries demonstrate antegrade flow. Subclavians: Normal flow hemodynamics were seen in bilateral subclavian              arteries. *See table(s) above for measurements and observations.  Electronically signed by Deitra Mayo MD on 05/09/2019 at 3:08:44 PM.    Final    Transthoracic Echocardiogram  Recent Results (from the past 43800 hour(s))  ECHOCARDIOGRAM COMPLETE   Collection Time: 05/09/19 11:56 AM  Result Value   Weight 3,872   Height 62   BP 183/86   Narrative     ECHOCARDIOGRAM REPORT       Patient Name:   CHANIN FRUMKIN Date of Exam: 05/09/2019 Medical Rec #:  [id number]    Height:       62.0 in Accession #:    [id number]   Weight:       242.0 lb Date of Birth:  03-08-1956    BSA:          2.07 m Patient Age:    51 years     BP:           183/86 mmHg Patient Gender: F            HR:           79 bpm. Exam Location:  Inpatient  Procedure: 2D Echo  Indications:    TIA (transient ischemic attack)   History:        Patient has prior history of  Echocardiogram examinations, most                 recent 06/21/2018. CHF Signs/Symptoms:Dyspnea Risk                 Factors:Hypertension and Morbid obesity. CKD                 Alcohol abuse.   Sonographer:    Talmage Coin Referring Phys: [id number] Ivor Costa    Sonographer Comments: Patient pushing techs hand away when attempting subcostal window. IMPRESSIONS    1. Left ventricular ejection fraction, by visual estimation, is 65 to 70%. The left ventricle has hyperdynamic function. Normal left ventricular size. There is mildly  increased left ventricular hypertrophy.  2. Elevated mean left atrial pressure.  3. Left ventricular diastolic Doppler parameters are consistent with pseudonormalization pattern of LV diastolic filling.  4. Global right ventricle has normal systolic function.The right ventricular size is normal. No increase in right ventricular wall thickness.  5. Left atrial size was mildly dilated.  6. Right atrial size was normal.  7. The mitral valve is normal in structure. No evidence of mitral valve regurgitation.  8. The tricuspid valve is normal in structure. Tricuspid valve regurgitation was not visualized by color flow Doppler.  9. The aortic valve is normal in structure. Aortic valve regurgitation was not visualized by color flow Doppler. 10. The pulmonic valve was not well visualized. Pulmonic valve regurgitation is trivial by color flow Doppler. 11. TR signal is inadequate for assessing pulmonary artery systolic pressure.  FINDINGS  Left Ventricle: Left ventricular ejection fraction, by visual estimation, is 65 to 70%. The left ventricle has hyperdynamic function. No evidence of left ventricular regional wall motion abnormalities. There is mildly increased left ventricular  hypertrophy. Concentric left ventricular hypertrophy. Normal left ventricular size. Spectral Doppler shows Left ventricular diastolic Doppler parameters are consistent with pseudonormalization pattern of LV diastolic filling. Elevated mean left atrial  pressure.  Right Ventricle: The right ventricular size is normal. No increase in right ventricular wall thickness. Global RV systolic function is has normal systolic function.  Left Atrium: Left atrial size was mildly dilated.  Right Atrium: Right atrial size was normal in size  Pericardium: There is no evidence of pericardial effusion.  Mitral Valve: The mitral valve is normal in structure. No evidence of mitral valve regurgitation.  Tricuspid Valve: The tricuspid valve is  normal in structure. Tricuspid valve regurgitation was not visualized by color flow Doppler.  Aortic Valve: The aortic valve is normal in structure. Aortic valve regurgitation was not visualized by color flow Doppler. Aortic valve mean gradient measures 8.0 mmHg. Aortic valve peak gradient measures 13.0 mmHg. Aortic valve area, by VTI measures  2.77 cm.  Pulmonic Valve: The pulmonic valve was not well visualized. Pulmonic valve regurgitation is trivial by color flow Doppler.  Aorta: The aortic root is normal in size and structure.  IAS/Shunts: No atrial level shunt detected by color flow Doppler.     LEFT VENTRICLE PLAX 2D LVIDd:         4.50 cm  Diastology LVIDs:         2.70 cm  LV e' lateral:   7.07 cm/s LV PW:         1.30 cm  LV E/e' lateral: 14.4 LV IVS:        1.30 cm  LV e' medial:    5.22 cm/s LVOT diam:     1.90 cm  LV E/e' medial:  19.5 LV  SV:         65 ml LV SV Index:   29.00 LVOT Area:     2.84 cm    RIGHT VENTRICLE RV S prime:     20.90 cm/s TAPSE (M-mode): 2.4 cm  LEFT ATRIUM             Index       RIGHT ATRIUM           Index LA diam:        3.70 cm 1.78 cm/m  RA Area:     18.20 cm LA Vol (A2C):   69.8 ml 33.67 ml/m RA Volume:   49.10 ml  23.69 ml/m LA Vol (A4C):   67.4 ml 32.51 ml/m LA Biplane Vol: 72.1 ml 34.78 ml/m  AORTIC VALVE AV Area (Vmax):    2.50 cm AV Area (Vmean):   2.39 cm AV Area (VTI):     2.77 cm AV Vmax:           180.00 cm/s AV Vmean:          133.000 cm/s AV VTI:            0.348 m AV Peak Grad:      13.0 mmHg AV Mean Grad:      8.0 mmHg LVOT Vmax:         159.00 cm/s LVOT Vmean:        112.000 cm/s LVOT VTI:          0.340 m LVOT/AV VTI ratio: 0.98   AORTA Ao Root diam: 2.80 cm  MITRAL VALVE MV Area (PHT): 2.87 cm              SHUNTS MV PHT:        76.56 msec            Systemic VTI:  0.34 m MV Decel Time: 264 msec              Systemic Diam: 1.90 cm MV E velocity: 102.00 cm/s 103 cm/s MV A velocity: 118.00 cm/s  70.3 cm/s MV E/A ratio:  0.86        1.5    Mihai Croitoru MD Electronically signed by Sanda Klein MD Signature Date/Time: 05/09/2019/1:01:47 PM       Final     *Note: Due to a large number of results and/or encounters for the requested time period, some results have not been displayed. A complete set of results can be found in Results Review.   ECG - SR rate 70 BPM. (See cardiology reading for complete details) PHYSICAL EXAM Blood pressure (!) 164/60, pulse 70, temperature 98 F (36.7 C), temperature source Oral, resp. rate 18, height 5\' 2"  (1.575 m), weight 109.8 kg, SpO2 100 %.  General: Appears well-developed middle-aged lady not in distress Psych: Affect appropriate to situation, somewhat anxious Eyes: No scleral injection HENT: No OP obstrucion Head: Normocephalic.  Cardiovascular: Normal rate and regular rhythm.  Respiratory: Effort normal and breath sounds normal to anterior ascultation GI: Soft. No distension. There is no tenderness.  Skin: Warm, dry  Neurological Examination Mental Status: Alert, oriented, thought content appropriate.  Speech fluent without evidence of aphasia. Able to follow 3 step commands without difficulty. Cranial Nerves: II: Visual fields grossly normal,  III,IV, VI: ptosis not present, extra-ocular motions intact bilaterally, pupils equal, round, reactive to light and accommodation V,VII: smile symmetric, facial light touch sensation normal bilaterally VIII: hearing normal bilaterally IX,X: uvula rises symmetrically XI: bilateral shoulder shrug  XII: midline tongue extension Motor: Right :  Upper extremity   5/5                                      Left:     Upper extremity   5/5             Lower extremity   5/5                                                  Lower extremity   5/5, bobs in air, but no drift.  Tone and bulk:normal tone throughout; no atrophy noted Sensory: reduced sensation to light touch  Plantars: Right:  downgoing                                Left: downgoing Cerebellar: normal finger-to-nose, normal rapid alternating movements  Gait: normal gait and station  HOME MEDICATIONS:  Medications Prior to Admission  Medication Sig Dispense Refill  . amLODipine (NORVASC) 10 MG tablet TAKE 1 TABLET (10 MG TOTAL) BY MOUTH DAILY. 30 tablet 0  . ferrous sulfate 325 (65 FE) MG tablet Take 1 tablet (325 mg total) by mouth daily with breakfast. 90 tablet 3  . FLUoxetine (PROZAC) 40 MG capsule Take 40 mg by mouth daily.     Marland Kitchen gabapentin (NEURONTIN) 600 MG tablet Take 1 tablet (600 mg total) by mouth 3 (three) times daily. 90 tablet 6  . hydrALAZINE (APRESOLINE) 25 MG tablet Take 25 mg by mouth 3 (three) times daily.    . hydrOXYzine (VISTARIL) 25 MG capsule Take 25 mg by mouth 4 (four) times daily as needed for anxiety.    . INGREZZA 80 MG CAPS Take 80 mg by mouth daily.    . isosorbide mononitrate (IMDUR) 60 MG 24 hr tablet TAKE 1 TABLET (60 MG TOTAL) BY MOUTH DAILY. 90 tablet 1  . Multiple Vitamins-Minerals (ADULT GUMMY) CHEW Chew 1 tablet by mouth daily.    . ondansetron (ZOFRAN) 4 MG tablet TAKE 1 TABLET BY MOUTH EVERY 8 HOURS UP TO 7 DAYS AS NEEDED FOR NAUSEA & VOMITING (Patient taking differently: Take 4 mg by mouth every 8 (eight) hours as needed for nausea or vomiting. ) 20 tablet 0  . pantoprazole (PROTONIX) 20 MG tablet Take 20 mg by mouth 2 (two) times daily.    Marland Kitchen terazosin (HYTRIN) 2 MG capsule Take 1 capsule (2 mg total) by mouth at bedtime. 90 capsule 1  . varenicline (CHANTIX CONTINUING MONTH PAK) 1 MG tablet Take 1 tablet (1 mg total) by mouth 2 (two) times daily. 60 tablet 6  . cloNIDine (CATAPRES) 0.1 MG tablet TAKE 1 TABLET (0.1 MG TOTAL) BY MOUTH 2 (TWO) TIMES DAILY. (Patient not taking: Reported on 05/09/2019) 60 tablet 2  . lidocaine (LIDODERM) 5 % Place 1 patch onto the skin daily. Remove & Discard patch within 12 hours or as directed by MD (Patient not taking: Reported on 05/09/2019)  30 patch 0  . nicotine (NICODERM CQ - DOSED IN MG/24 HOURS) 21 mg/24hr patch Place 1 patch (21 mg total) onto the skin daily. 28 patch 0  . oxybutynin (DITROPAN-XL) 5 MG 24 hr tablet TAKE 1 TABLET BY MOUTH  AT BEDTIME (Patient not taking: Reported on 05/09/2019) 90 tablet 1  . pantoprazole (PROTONIX) 40 MG tablet Take 1 tablet (40 mg total) by mouth 2 (two) times daily. (Patient not taking: Reported on 05/09/2019) 90 tablet 3      HOSPITAL MEDICATIONS:  . acetaminophen  650 mg Oral TID  . amLODipine  10 mg Oral Daily  . carvedilol  6.25 mg Oral BID WC  . enoxaparin (LOVENOX) injection  40 mg Subcutaneous Q24H  . ferrous sulfate  325 mg Oral Q breakfast  . FLUoxetine  40 mg Oral Daily  . furosemide  20 mg Oral Daily  . gabapentin  600 mg Oral TID  . hydrALAZINE  100 mg Oral TID  . isosorbide mononitrate  60 mg Oral Daily  . lidocaine  1 patch Transdermal Q24H  . multivitamin with minerals  1 tablet Oral Daily  . nicotine  21 mg Transdermal Daily  . oxybutynin  5 mg Oral QHS  . pantoprazole  40 mg Oral BID  . varenicline  1 mg Oral BID    ALLERGIES Allergies  Allergen Reactions  . Abilify [Aripiprazole] Other (See Comments)    Tardive dyskinesia Oral  . Remeron [Mirtazapine] Other (See Comments)    Wgt stimulation /gain, Dizziness, Patient says "can tolerate"  . Trazodone And Nefazodone Other (See Comments)    Nightmares/sleep diturbance  . Flexeril [Cyclobenzaprine] Other (See Comments)    Pt states Flexeril makes her feel depressed   . Amoxicillin Diarrhea and Other (See Comments)    NOTE the patient has had PCN WITHOUT reaction Has patient had a PCN reaction causing immediate rash, facial/tongue/throat swelling, SOB or lightheadedness with hypotension: No Has patient had a PCN reaction causing severe rash involving mucus membranes or skin necrosis: No Has patient had a PCN reaction that required hospitalization: No Has patient had a PCN reaction occurring within the last 10  years: No If all of the above answers are "NO", then may proceed with Cephalosporin use.    ASSESSMENT/PLAN [name] is a 63 y.o. female with history of hypertension, GERD, depression, anxiety, substance abuse, alcohol abuse, HCV, CKD stage III, breast cancer, systolic heart failure, medication noncompliance presenting with h/a and left side weakness. She did not receive IV t-PA due to outside time window, mild sx.   Probable right brain subcortical from small vessel disease.  In the setting of hypertensive emergency  Resultant  H/a and left side weak  Code Stroke CT Head -    ASPECTS not done  CT head neg  MRI head-neg  MRA head - neg  CTA H&N - not done  CT Perfusion- not done  Carotid Doppler- no stensois bilat  2D Echo -65%, LVH, LA mild dilation, no thrombus  Sars Corona Virus 2  neg  LDL - 188    Component Value Date/Time   LDLCALC 188 (H) 05/09/2019 0547     HgbA1c - 5.4  UDS neg  VTE prophylaxis - lovenox Diet  Diet Order            Diet Heart Room service appropriate? Yes; Fluid consistency: Thin  Diet effective now               none prior to admission, now on ASA 81mg  PO  Patient counseled to be compliant with her antithrombotic medications  Ongoing aggressive stroke risk factor management  Therapy recommendations: Home health services  Disposition:  Pending  Hypertension  Home BP meds: Norvasc, Imdur, catapres  Current  BP meds: norvasc, coreg, Imdur, apersoline,   HTN urgency initially, but now Stable . NO Permissive hypertension; no stroke . Long-term BP goal normotensive  Hyperlipidemia  Home Lipid lowering medication: none   LDL 188, goal < 100  Current lipid lowering medication: Lipitor 20 mg daily  Continue statin at discharge  Diabetes  Home diabetic meds: none  Current diabetic meds:none  HgbA1c wnl, goal < 7.0  No results for input(s): GLUCAP in the last 72 hours.  Other Stroke Risk  Factors  Cigarette smoker; advised to stop smoking- she tells me she has tried chantix  ETOH use, advised to drink no more than 1 drink a day  Obesity, Body mass index is 44.26 kg/m., recommend weight loss, diet and exercise as appropriate   Coronary artery disease  Substance Abuse  Other Active Problems  RLS- will order ropinirole 4mg  QHS  Insomnia- getting the RLS under control will help with this  Neuropathy and leg pain- continue home neurontin  Drug and ETOH abuse history  medicaiton non-compliance  Anxiety/depression- con't home vistaril  TD- continue [facility] day # 0 Desiree Metzger-Cihelka, ARNP-C, ANVP-BC Pager: [phone] I have personally obtained history,examined this patient, reviewed notes, independently viewed imaging studies, participated in medical decision making and plan of care.ROS completed by me personally and pertinent positives fully documented  I have made any additions or clarifications directly to the above note.  She presented with a hypertensive emergency and had right left leg weakness which has not recovered fully yet and MRI is negative for stroke.  Patient however has some giveaway weakness in multifocal complaints and has underlying restless leg syndrome which could be contributing.  Recommend add Requip 4 mg for restless leg.  Continue gabapentin and the current dose as well.  Aspirin for stroke prevention.  Continue ongoing stroke work-up.  Greater than 50% time during this 35-minute visit was spent on counseling and coordination of care about TIA and restless legs and answering questions.  Discussed with Dr. Andi Hence, MD Medical Director [place] Pager: [phone] 05/09/2019 5:54 PM  To contact Stroke Continuity provider, please refer to [email or web address] After hours, contact General Neurology

## 2019-05-09 NOTE — Consult Note (Signed)
Requesting Physician: Dr.Niu    Chief Complaint: Left side numbness  History obtained from: Patient and Chart   HPI:                                                                                                                                       Sherry [name] is a 63 y.o. female with past medical history significant for hypertension, GERD, depression, anxiety, substance abuse, alcohol abuse, HCV, CKD stage III, breast cancer, systolic heart failure, medication noncompliance who presented to [place] with left-sided weakness and numbness and headache in the setting of severely elevated blood pressure.   Patient presented yesterday to [facility].  She was initially evaluated at her cardiologist office and was complaining of headache, intermittent numbness in the left side and her blood pressure was significantly elevated and was sent to the ER.  Patient on clonidine however was not taking it as recommended due to the medicine making her feel dizzy.  On examination by EDP she had intermittent left-sided numbness and upper visual field deficit.  Stat CT head was negative for acute findings.  Case was discussed by neurology and patient transferred to [facility] for further evaluation.  Date last known well: 05/07/2019 tPA Given: Outside window for TPA  Past Medical History:  Diagnosis Date  . Anxiety   . Bipolar and related disorder ([place])   . Blood transfusion without reported diagnosis   . Breast cancer ([place])    right lumpectemy and lymph node removal  . CHF (congestive heart failure) ([place])   . Chronic back pain   . Chronic kidney disease    "Stage IV" - states due to hypertension  . Depression   . GERD (gastroesophageal reflux disease)   . Hepatitis C   . Hypertension   . Left-sided weakness   . MRSA infection    of breast incision  . Opioid dependence ([place])    Has been treated in [facility] in past  . Substance abuse [facility])     Past Surgical History:   Procedure Laterality Date  . BACK SURGERY  1990  . BREAST SURGERY Right 2010   "breast cancer survivor" - states partial mastectomy and nodes  . COLONOSCOPY     [facility]  . ESOPHAGOGASTRODUODENOSCOPY     [facility]  . LAPAROSCOPIC CHOLECYSTECTOMY  2002  . LAPAROSCOPIC INCISIONAL / UMBILICAL / VENTRAL HERNIA REPAIR  04/13/2018   with BARD 15x 30QT mesh (supraumbilical)  . LAPAROSCOPIC LYSIS OF ADHESIONS  07/12/2018   Procedure: LAPAROSCOPIC LYSIS OF ADHESIONS;  Surgeon: Isabel Caprice, MD;  Location: [facility] ORS;  Service: Gynecology;;  . MYOMECTOMY     x 2 prior to hysterectomy  . ROBOTIC ASSISTED BILATERAL SALPINGO OOPHERECTOMY Right 07/12/2018   Procedure: XI ROBOTIC ASSISTED RIGHT SALPINGO OOPHORECTOMY;  Surgeon: Isabel Caprice, MD;  Location: [facility] ORS;  Service: Gynecology;  Laterality: Right;  . TOTAL ABDOMINAL HYSTERECTOMY     fibroids   . UPPER GASTROINTESTINAL ENDOSCOPY      Family History  Problem Relation Age of Onset  . Heart attack Mother   . Breast cancer Mother 61  . Dementia Mother   . Cancer Father 9       died of bleeding from kidneys  . Colon cancer Neg Hx   . Esophageal cancer Neg Hx   . Stomach cancer Neg Hx   . Rectal cancer Neg Hx    Social History:  reports that she has been smoking cigarettes. She has been smoking about 0.25 packs per day. She has never used smokeless tobacco. She reports previous drug use. She reports that she does not drink alcohol.  Allergies:  Allergies  Allergen Reactions  . Abilify [Aripiprazole] Other (See Comments)    Tardive dyskinesia Oral  . Remeron [Mirtazapine] Other (See Comments)    Wgt stimulation /gain, Dizziness, Patient says "can tolerate"  . Trazodone And Nefazodone Other (See Comments)    Nightmares/sleep diturbance  . Flexeril [Cyclobenzaprine] Other (See Comments)    Pt states Flexeril makes her feel depressed   . Amoxicillin Diarrhea and Other (See Comments)    NOTE the patient has had  PCN WITHOUT reaction Has patient had a PCN reaction causing immediate rash, facial/tongue/throat swelling, SOB or lightheadedness with hypotension: No Has patient had a PCN reaction causing severe rash involving mucus membranes or skin necrosis: No Has patient had a PCN reaction that required hospitalization: No Has patient had a PCN reaction occurring within the last 10 years: No If all of the above answers are "NO", then may proceed with Cephalosporin use.     Medications:                                                                                                                        I reviewed home medications   ROS:                                                                                                                                     14 systems reviewed and negative except above   Examination:  General: Appears well-developed  Psych: Affect appropriate to situation Eyes: No scleral injection HENT: No OP obstrucion Head: Normocephalic.  Cardiovascular: Normal rate and regular rhythm.  Respiratory: Effort normal and breath sounds normal to anterior ascultation GI: Soft.  No distension. There is no tenderness.  Skin: WDI    Neurological Examination Mental Status: Alert, oriented, thought content appropriate.  Speech fluent without evidence of aphasia. Able to follow 3 step commands without difficulty. Cranial Nerves: II: Visual fields grossly normal,  III,IV, VI: ptosis not present, extra-ocular motions intact bilaterally, pupils equal, round, reactive to light and accommodation V,VII: smile symmetric, facial light touch sensation normal bilaterally VIII: hearing normal bilaterally IX,X: uvula rises symmetrically XI: bilateral shoulder shrug XII: midline tongue extension Motor: Right : Upper extremity   5/5    Left:     Upper extremity   5/5  Lower  extremity   5/5     Lower extremity   4+/5 Tone and bulk:normal tone throughout; no atrophy noted Sensory: reduced sensation to light touch  Plantars: Right: downgoing   Left: downgoing Cerebellar: normal finger-to-nose, normal rapid alternating movements  Gait: normal gait and station     Lab Results: Basic Metabolic Panel: Recent Labs  Lab 05/08/19 1711  NA 141  K 5.1  CL 108  CO2 23  GLUCOSE 102*  BUN 28*  CREATININE 2.13*  CALCIUM 9.3    CBC: Recent Labs  Lab 05/08/19 1711  WBC 6.7  NEUTROABS 4.2  HGB 11.6*  HCT 38.0  MCV 91.6  PLT 322    Coagulation Studies: No results for input(s): LABPROT, INR in the last 72 hours.  Imaging: Dg Chest 2 View  Result Date: 05/08/2019 CLINICAL DATA:  Back pain and hypertension EXAM: CHEST - 2 VIEW COMPARISON:  04/25/2019 FINDINGS: Heart size is enlarged. There is atelectasis at the lung bases. The lung volumes are somewhat low. There is no pneumothorax. No large pleural effusion. There is some mild vascular congestion. IMPRESSION: Cardiomegaly with mild vascular congestion. Electronically Signed   By: Constance Holster M.D.   On: 05/08/2019 18:15   Ct Head Wo Contrast  Result Date: 05/08/2019 CLINICAL DATA:  Headache, LEFT-sided numbness intermittently, history uncontrolled hypertension, CHF, advanced renal insufficiency, breast cancer EXAM: CT HEAD WITHOUT CONTRAST TECHNIQUE: Contiguous axial images were obtained from the base of the skull through the vertex without intravenous contrast. Sagittal and coronal MPR images reconstructed from axial data set. COMPARISON:  09/03/2018 FINDINGS: Brain: Normal ventricular morphology. No midline shift or mass effect. Small vessel chronic ischemic changes of deep cerebral white matter. No intracranial hemorrhage, mass lesion, or evidence of acute infarction. No extra-axial fluid collections. Vascular: No hyperdense vessels. Atherosclerotic calcification of internal carotid arteries at  skull base. Skull: Intact Sinuses/Orbits: Clear Other: N/A IMPRESSION: Small vessel chronic ischemic changes of deep cerebral white matter. No acute intracranial abnormalities. Electronically Signed   By: Lavonia Dana M.D.   On: 05/08/2019 18:03     ASSESSMENT AND PLAN   Hypertensive emergency versus TIA/stroke versus posterior reversible encephalopathy syndrome   Recommendation MRI brain without contrast Frequent neurochecks BP goal :  Less [id number] systolic with gradual return to normotension  Depending on MRI brain results, will recommend stroke workup   Chuckie Mccathern [facility] Pager Number [phone]

## 2019-05-09 NOTE — Evaluation (Signed)
Physical Therapy Evaluation Patient Details Name: Sherry [name] MRN: [id number] DOB: Nov 15, 1955 Today's Date: 05/09/2019   History of Present Illness  Pt is a 63 yr old female with medical history significant of hypertension, GERD, depression, anxiety, substance abuse, alcohol abuse in remission, tobacco abuse, HCV, sCHF, breast cancer (right lumpectomy), CKD-III3b, iron deficiency anemia, medication noncompliance, who presents with elevated blood pressure and a left-sided weakness and numbness. Pt reports that she has intermitent left sided weakness and numbness for about 1 weeks which she attributed to clonidine use. RN informed OT that patient's MRI came back negative for acute CVA.   Clinical Impression  Pt is up to walk with PT and note her weakness but no outright LOB.  Pt is motivated and wants to be seen for therapy, and will work well for a transition home.  Will try to work on stairs next PT visit, progress her to more strenuous ex's and work on standing endurance as tolerated to shorten her need for home therapy.      Follow Up Recommendations Home health PT;Supervision for mobility/OOB    Equipment Recommendations  Other (comment)(Rollator with seat and 4 wheels)    Recommendations for Other Services       Precautions / Restrictions Precautions Precautions: Fall Precaution Comments: try rollator next visit Restrictions Weight Bearing Restrictions: No      Mobility  Bed Mobility Overal bed mobility: Needs Assistance Bed Mobility: Supine to Sit     Supine to sit: Min assist     General bed mobility comments: minor help to support trunk to stand  Transfers Overall transfer level: Needs assistance Equipment used: Rolling walker (2 wheeled) Transfers: Sit to/from Stand Sit to Stand: Min guard;Min assist         General transfer comment: minor help due to her use of RW  Ambulation/Gait Ambulation/Gait assistance: Min guard Gait Distance (Feet): 130  Feet Assistive device: Rolling walker (2 wheeled) Gait Pattern/deviations: Step-through pattern;Decreased stride length;Decreased stance time - left;Shuffle;Narrow base of support Gait velocity: reduced Gait velocity interpretation: <1.31 ft/sec, indicative of household ambulator General Gait Details: using RW to unload her LLE  Stairs            Wheelchair Mobility    Modified Rankin (Stroke Patients Only)       Balance Overall balance assessment: Needs assistance Sitting-balance support: Feet supported Sitting balance-Leahy Scale: Good     Standing balance support: Bilateral upper extremity supported;During functional activity Standing balance-Leahy Scale: Fair                               Pertinent Vitals/Pain Pain Assessment: 0-10 Pain Score: 8  Pain Location: HA, L side of body esp LE Pain Descriptors / Indicators: Grimacing Pain Intervention(s): Limited activity within patient's tolerance;Monitored during session;Premedicated before session;Repositioned    Home Living Family/patient expects to be discharged to:: Private residence Living Arrangements: Alone Available Help at Discharge: Personal care attendant Type of Home: House Home Access: Stairs to enter Entrance Stairs-Rails: Psychiatric nurse of Steps: 4 Home Layout: One level Home Equipment: Environmental consultant - 2 wheels;Cane - single point;Shower seat Additional Comments: asking for a rollator    Prior Function Level of Independence: Needs assistance   Gait / Transfers Assistance Needed: SPC for mobility  ADL's / Homemaking Assistance Needed: able to do self care with help of aide  Comments: 11-1 assist     Hand Dominance   Dominant Hand: Right  Extremity/Trunk Assessment   Upper Extremity Assessment Upper Extremity Assessment: Overall WFL for tasks assessed    Lower Extremity Assessment Lower Extremity Assessment: LLE deficits/detail LLE Deficits / Details: 3 to  4- strength    Cervical / Trunk Assessment Cervical / Trunk Assessment: Normal  Communication   Communication: No difficulties  Cognition Arousal/Alertness: Awake/alert Behavior During Therapy: WFL for tasks assessed/performed Overall Cognitive Status: Within Functional Limits for tasks assessed                                        General Comments General comments (skin integrity, edema, etc.): pt is able to walk and get back to bed with min guard, more mobile than initially thought    Exercises     Assessment/Plan    PT Assessment Patient needs continued PT services  PT Problem List Decreased strength;Decreased range of motion;Decreased activity tolerance;Decreased balance;Decreased mobility;Decreased coordination;Decreased knowledge of use of DME;Decreased safety awareness;Decreased knowledge of precautions;Pain       PT Treatment Interventions DME instruction;Gait training;Stair training;Functional mobility training;Therapeutic activities;Therapeutic exercise;Balance training;Neuromuscular re-education;Patient/family education    PT Goals (Current goals can be found in the Care Plan section)  Acute Rehab PT Goals Patient Stated Goal: to get home safely PT Goal Formulation: With patient Time For Goal Achievement: 05/23/19 Potential to Achieve Goals: Good    Frequency Min 3X/week   Barriers to discharge Decreased caregiver support home alone during the day    Co-evaluation               AM-PAC PT "6 Clicks" Mobility  Outcome Measure Help needed turning from your back to your side while in a flat bed without using bedrails?: A Little Help needed moving from lying on your back to sitting on the side of a flat bed without using bedrails?: A Little Help needed moving to and from a bed to a chair (including a wheelchair)?: A Little Help needed standing up from a chair using your arms (e.g., wheelchair or bedside chair)?: A Little Help needed to  walk in hospital room?: A Lot Help needed climbing 3-5 steps with a railing? : A Lot 6 Click Score: 16    End of Session Equipment Utilized During Treatment: Gait belt Activity Tolerance: Patient limited by pain Patient left: in bed;with call bell/phone within reach;with bed alarm set Nurse Communication: Mobility status;Other (comment)(request for rollator) PT Visit Diagnosis: Unsteadiness on feet (R26.81);Muscle weakness (generalized) (M62.81)    Time: 8185-6314 PT Time Calculation (min) (ACUTE ONLY): 31 min   Charges:   PT Evaluation $PT Eval Moderate Complexity: 1 Mod PT Treatments $Gait Training: 8-22 mins       Ramond Dial 05/09/2019, 6:06 PM   Mee Hives, PT MS Acute Rehab Dept. Number: [place]

## 2019-05-09 NOTE — H&P (Signed)
History and Physical    Sherry [name] JQB:[id number] DOB: 12-Oct-1955 DOA: 05/08/2019  Referring MD/NP/PA:   PCP: Forrest Moron, MD   Patient coming from:  The patient is coming from home.  At baseline, pt is independent for most of ADL.        Chief Complaint: elevated blood pressure and left sided weakness and numbness.  HPI: Sherry [name] is a 63 y.o. female with medical history significant of hypertension, GERD, depression, anxiety, substance abuse, alcohol abuse in remission, tobacco abuse, HCV, sCHF, breast cancer (right lumpectomy), CKD-III3b, iron deficiency anemia, medication noncompliance, who presents with elevated blood pressure and a left-sided weakness and numness.  Pt states that she has bad headache today and went to see her cardiologist, Dr. Geraldo Pitter. She was found to have elevated Bp in the range of approximately 409 systolic. She was sent to ED for further evaluation and treatment.  Patient also complains of lower back pain. She states that  her headache is 9/10, located in in back of head, nothing makes it better or worse. She reports that she has intermitent left sided weakness and numbness for about 1 weeks which she attributed to clonidine use. She feels that her speech is slurring some times. Her home blood pressure medications include amlodipine 10 mg daily, clonidine 0.1 mg bid, hydralazine 100 mg tid and terazosin. She states that she has been decreasing the clonidine use for over a week.  Patient denies chest pain, shortness of breath.  She has mild cough, but no fever or chills.  Denies nausea, vomiting, diarrhea, abdominal pain, symptoms of UTI. Her bp was 232/109 in ED. Pt was given IV labetalol 10 mg, amlodipine 10 mg, HCTZ 25 mg and hydralazine 100 mg.  Blood pressure improved to SBP 170s. Per EDP's note, pt has "Visual field deficit (upper bilateral visual field)" in ED.   ED Course: pt was found to have troponin 6, WBC 6.7, negative COVID-19 test, renal function  close to baseline, temperature 99.2, heart rate is 70s, oxygen satting 91 200% on room air.  Chest x-ray showed cardiomegaly and mild vascular congestion.  CT head is negative for acute intracranial abnormalities patient is placed on telemetry bed for observation. EDP discussed with Dr. Leonel Ramsay of neurology.   Review of Systems:   General: no fevers, chills, no body weight gain, has fatigue HEENT: no blurry vision, hearing changes or sore throat.  Has HA. Respiratory: no dyspnea, coughing, wheezing CV: no chest pain, no palpitations GI: no nausea, vomiting, abdominal pain, diarrhea, constipation GU: no dysuria, burning on urination, increased urinary frequency, hematuria  Ext: no leg edema Neuro: no vision change or hearing loss. has left sided weakness and numbness. Has slurred speech. Skin: no rash, no skin tear. MSK: No muscle spasm, no deformity, no limitation of range of movement in spin Heme: No easy bruising.  Travel history: No recent long distant travel.  Allergy:  Allergies  Allergen Reactions   Abilify [Aripiprazole] Other (See Comments)    Tardive dyskinesia Oral   Remeron [Mirtazapine] Other (See Comments)    Wgt stimulation /gain, Dizziness, Patient says "can tolerate"   Trazodone And Nefazodone Other (See Comments)    Nightmares/sleep diturbance   Flexeril [Cyclobenzaprine] Other (See Comments)    Pt states Flexeril makes her feel depressed    Amoxicillin Diarrhea and Other (See Comments)    NOTE the patient has had PCN WITHOUT reaction Has patient had a PCN reaction causing immediate rash, facial/tongue/throat swelling, SOB or lightheadedness with  hypotension: No Has patient had a PCN reaction causing severe rash involving mucus membranes or skin necrosis: No Has patient had a PCN reaction that required hospitalization: No Has patient had a PCN reaction occurring within the last 10 years: No If all of the above answers are "NO", then may proceed with  Cephalosporin use.     Past Medical History:  Diagnosis Date   Anxiety    Bipolar and related disorder ([place])    Blood transfusion without reported diagnosis    Breast cancer ([place])    right lumpectemy and lymph node removal   CHF (congestive heart failure) ([place])    Chronic back pain    Chronic kidney disease    "Stage IV" - states due to hypertension   Depression    GERD (gastroesophageal reflux disease)    Hepatitis C    Hypertension    Left-sided weakness    MRSA infection    of breast incision   Opioid dependence ([place])    Has been treated in [place] in past   Substance abuse ([place])     Past Surgical History:  Procedure Laterality Date   [place]   BREAST SURGERY Right 2010   "breast cancer survivor" - states partial mastectomy and nodes   COLONOSCOPY     [facility]   ESOPHAGOGASTRODUODENOSCOPY     [facility]   LAPAROSCOPIC CHOLECYSTECTOMY  2002   LAPAROSCOPIC INCISIONAL / UMBILICAL / [place]  04/13/2018   with BARD 15x 17CB mesh (supraumbilical)   LAPAROSCOPIC LYSIS OF ADHESIONS  07/12/2018   Procedure: LAPAROSCOPIC LYSIS OF ADHESIONS;  Surgeon: Isabel Caprice, MD;  Location: [facility];  Service: Gynecology;;   MYOMECTOMY     x 2 prior to hysterectomy   ROBOTIC ASSISTED BILATERAL SALPINGO OOPHERECTOMY Right 07/12/2018   Procedure: XI ROBOTIC ASSISTED RIGHT SALPINGO OOPHORECTOMY;  Surgeon: Isabel Caprice, MD;  Location: [facility];  Service: Gynecology;  Laterality: Right;   TOTAL ABDOMINAL HYSTERECTOMY     fibroids    UPPER GASTROINTESTINAL ENDOSCOPY      Social History:  reports that she has been smoking cigarettes. She has been smoking about 0.25 packs per day. She has never used smokeless tobacco. She reports previous drug use. She reports that she does not drink alcohol.  Family History:  Family History  Problem Relation Age of Onset   Heart attack Mother    Breast cancer Mother 24     Dementia Mother    Cancer Father 74       died of bleeding from kidneys   Colon cancer Neg Hx    Esophageal cancer Neg Hx    Stomach cancer Neg Hx    Rectal cancer Neg Hx      Prior to Admission medications   Medication Sig Start Date End Date Taking? Authorizing Provider  ALPRAZolam Duanne Moron) 1 MG tablet Take 1-2 tablets thirty minutes prior to MRI.  May take one additional tablet before entering scanner, if needed.  MUST HAVE DRIVER. 4/49/67   Marcial Pacas, MD  amLODipine (NORVASC) 10 MG tablet TAKE 1 TABLET (10 MG TOTAL) BY MOUTH DAILY. 05/08/19   Forrest Moron, MD  cloNIDine (CATAPRES) 0.1 MG tablet TAKE 1 TABLET (0.1 MG TOTAL) BY MOUTH 2 (TWO) TIMES DAILY. 05/08/19   Forrest Moron, MD  ferrous sulfate 325 (65 FE) MG tablet Take 1 tablet (325 mg total) by mouth daily with breakfast. 10/27/18   Forrest Moron, MD  FLUoxetine (PROZAC) 40 MG capsule 1 capsule daily. 11/09/18   [provider]  gabapentin (NEURONTIN) 600 MG tablet Take 1 tablet (600 mg total) by mouth 3 (three) times daily. 04/24/19   Forrest Moron, MD  hydrALAZINE (APRESOLINE) 100 MG tablet Take 100 mg by mouth 3 (three) times daily.  04/26/18   [provider]  hydrOXYzine (VISTARIL) 25 MG capsule Take 25 mg by mouth 4 (four) times daily as needed for anxiety. 12/26/18   [provider]  INGREZZA 80 MG CAPS Take 80 mg by mouth daily. 10/15/17   [provider]  isosorbide mononitrate (IMDUR) 60 MG 24 hr tablet TAKE 1 TABLET (60 MG TOTAL) BY MOUTH DAILY. 12/27/18   Forrest Moron, MD  lidocaine (LIDODERM) 5 % Place 1 patch onto the skin daily. Remove & Discard patch within 12 hours or as directed by MD 04/25/19   Eustaquio Maize, PA-C  Multiple Vitamins-Minerals (ADULT GUMMY) CHEW Chew 1 tablet by mouth daily. 07/21/17   [provider]  nicotine (NICODERM CQ - DOSED IN MG/24 HOURS) 21 mg/24hr patch Place 1 patch (21 mg total) onto the skin daily. 12/28/18   Delia Chimes  A, MD  ondansetron (ZOFRAN) 4 MG tablet TAKE 1 TABLET BY MOUTH EVERY 8 HOURS UP TO 7 DAYS AS NEEDED FOR NAUSEA & VOMITING 02/10/19   Delia Chimes A, MD  oxybutynin (DITROPAN-XL) 5 MG 24 hr tablet TAKE 1 TABLET BY MOUTH AT BEDTIME 12/27/18   Delia Chimes A, MD  pantoprazole (PROTONIX) 40 MG tablet Take 1 tablet (40 mg total) by mouth 2 (two) times daily. 01/25/19 03/23/19  Cirigliano, Vito V, DO  terazosin (HYTRIN) 2 MG capsule Take 1 capsule (2 mg total) by mouth at bedtime. 10/27/18   Forrest Moron, MD  varenicline (CHANTIX CONTINUING MONTH PAK) 1 MG tablet Take 1 tablet (1 mg total) by mouth 2 (two) times daily. 01/02/19   Forrest Moron, MD    Physical Exam: Vitals:   05/08/19 2301 05/09/19 0103 05/09/19 0106 05/09/19 0140  BP: (!) 232/109 (!) 191/61 (!) 185/81 (!) 176/85  Pulse: 73 88 86 86  Resp: 16 18    Temp: 99.2 F (37.3 C)     TempSrc: Oral     SpO2: 100% 100% 100%   Weight:      Height:       General: Not in acute distress HEENT:       Eyes: PERRL, EOMI, no scleral icterus.       ENT: No discharge from the ears and nose, no pharynx injection, no tonsillar enlargement.        Neck: No JVD, no bruit, no mass felt. Heme: No neck lymph node enlargement. Cardiac: S1/S2, RRR, No murmurs, No gallops or rubs. Respiratory:  No rales, wheezing, rhonchi or rubs. GI: Soft, nondistended, nontender, no rebound pain, no organomegaly, BS present. GU: No hematuria Ext: No pitting leg edema bilaterally. 2+DP/PT pulse bilaterally. Musculoskeletal: No joint deformities, No joint redness or warmth, no limitation of ROM in spin. Skin: No rashes.  Neuro: Alert, oriented X3, cranial nerves II-XII grossly intact, moves all extremities normally. Muscle strength 4/5 in right leg and arm. Sensation to light touch intact. Brachial reflex 2+ bilaterally. Negative Babinski's sign. Psych: Patient is not psychotic, no suicidal or hemocidal ideation.  Labs on Admission: I have personally reviewed  following labs and imaging studies  CBC: Recent Labs  Lab 05/08/19 1711  WBC 6.7  NEUTROABS 4.2  HGB 11.6*  HCT 38.0  MCV 91.6  PLT 885   Basic Metabolic Panel: Recent Labs  Lab 05/08/19 1711  NA 141  K 5.1  CL 108  CO2 23  GLUCOSE 102*  BUN 28*  CREATININE 2.13*  CALCIUM 9.3   GFR: Estimated Creatinine Clearance: 31.6 mL/min (A) (by C-G formula based on SCr of 2.13 mg/dL (H)). Liver Function Tests: Recent Labs  Lab 05/08/19 1711  AST 13*  ALT 11  ALKPHOS 109  BILITOT 0.3  PROT 7.9  ALBUMIN 4.2   No results for input(s): LIPASE, AMYLASE in the last 168 hours. No results for input(s): AMMONIA in the last 168 hours. Coagulation Profile: No results for input(s): INR, PROTIME in the last 168 hours. Cardiac Enzymes: No results for input(s): CKTOTAL, CKMB, CKMBINDEX, TROPONINI in the last 168 hours. BNP (last 3 results) No results for input(s): PROBNP in the last 8760 hours. HbA1C: No results for input(s): HGBA1C in the last 72 hours. CBG: No results for input(s): GLUCAP in the last 168 hours. Lipid Profile: No results for input(s): CHOL, HDL, LDLCALC, TRIG, CHOLHDL, LDLDIRECT in the last 72 hours. Thyroid Function Tests: No results for input(s): TSH, T4TOTAL, FREET4, T3FREE, THYROIDAB in the last 72 hours. Anemia Panel: No results for input(s): VITAMINB12, FOLATE, FERRITIN, TIBC, IRON, RETICCTPCT in the last 72 hours. Urine analysis:    Component Value Date/Time   COLORURINE YELLOW 06/27/2018 1409   APPEARANCEUR CLEAR 06/27/2018 1409   LABSPEC 1.015 06/27/2018 1409   PHURINE 5.0 06/27/2018 1409   GLUCOSEU NEGATIVE 06/27/2018 1409   HGBUR NEGATIVE 06/27/2018 1409   BILIRUBINUR NEGATIVE 06/27/2018 1409   KETONESUR NEGATIVE 06/27/2018 1409   PROTEINUR 100 (A) 06/27/2018 1409   UROBILINOGEN 0.2 01/12/2010 1941   NITRITE NEGATIVE 06/27/2018 1409   LEUKOCYTESUR NEGATIVE 06/27/2018 1409   Sepsis Labs: @LABRCNTIP (procalcitonin:4,lacticidven:4) ) Recent  Results (from the past 240 hour(s))  SARS CORONAVIRUS 2 (TAT 6-24 HRS) Nasopharyngeal Nasopharyngeal Swab     Status: None   Collection Time: 05/08/19  7:52 PM   Specimen: Nasopharyngeal Swab  Result Value Ref Range Status   SARS Coronavirus 2 NEGATIVE NEGATIVE Final    Comment: (NOTE) SARS-CoV-2 target nucleic acids are NOT DETECTED. The SARS-CoV-2 RNA is generally detectable in upper and lower respiratory specimens during the acute phase of infection. Negative results do not preclude SARS-CoV-2 infection, do not rule out co-infections with other pathogens, and should not be used as the sole basis for treatment or other patient management decisions. Negative results must be combined with clinical observations, patient history, and epidemiological information. The expected result is Negative. Fact Sheet for Patients: SugarRoll.be Fact Sheet for Healthcare Providers: https://www.woods-mathews.com/ This test is not yet approved or cleared by the [place] FDA and  has been authorized for detection and/or diagnosis of SARS-CoV-2 by FDA under an Emergency Use Authorization (EUA). This EUA will remain  in effect (meaning this test can be used) for the duration of the COVID-19 declaration under Section 56 4(b)(1) of the Act, 21 U.S.C. section 360bbb-3(b)(1), unless the authorization is terminated or revoked sooner. Performed at [facility], [place] [address], [place], [place] [address]      Radiological Exams on Admission: Dg Chest 2 View  Result Date: 05/08/2019 CLINICAL DATA:  Back pain and hypertension EXAM: CHEST - 2 VIEW COMPARISON:  04/25/2019 FINDINGS: Heart size is enlarged. There is atelectasis at the lung bases. The lung volumes are somewhat low. There is no pneumothorax. No large pleural effusion. There is some  mild vascular congestion. IMPRESSION: Cardiomegaly with mild vascular congestion. Electronically Signed   By:  Constance Holster M.D.   On: 05/08/2019 18:15   Ct Head Wo Contrast  Result Date: 05/08/2019 CLINICAL DATA:  Headache, LEFT-sided numbness intermittently, history uncontrolled hypertension, CHF, advanced renal insufficiency, breast cancer EXAM: CT HEAD WITHOUT CONTRAST TECHNIQUE: Contiguous axial images were obtained from the base of the skull through the vertex without intravenous contrast. Sagittal and coronal MPR images reconstructed from axial data set. COMPARISON:  09/03/2018 FINDINGS: Brain: Normal ventricular morphology. No midline shift or mass effect. Small vessel chronic ischemic changes of deep cerebral white matter. No intracranial hemorrhage, mass lesion, or evidence of acute infarction. No extra-axial fluid collections. Vascular: No hyperdense vessels. Atherosclerotic calcification of internal carotid arteries at skull base. Skull: Intact Sinuses/Orbits: Clear Other: N/A IMPRESSION: Small vessel chronic ischemic changes of deep cerebral white matter. No acute intracranial abnormalities. Electronically Signed   By: Lavonia Dana M.D.   On: 05/08/2019 18:03     EKG: Independently reviewed.  Sinus rhythm, QTC 436, LAE, LAD (borderline), nonspecific T wave change.  Assessment/Plan Principal Problem:   Hypertensive emergency Active Problems:   Depression with anxiety   Chronic kidney disease, stage III (moderate)   Essential hypertension   Microcytic anemia   Cigarette smoker   Left-sided weakness   Hypertension   Chronic diastolic CHF (congestive heart failure) (HCC)   GERD (gastroesophageal reflux disease)   Hypertensive emergency and hx of HTN: Her bp was 232/109 in ED. Pt was given IV labetalol 10 mg, amlodipine 10 mg, HCTZ 25 mg and hydralazine 100 mg.  Blood pressure improved to SBP 170s.   -will place on tele bed for obs -continue amlodipine, hydralazine, terazosin, clonidine (increase clonidine dose from 0.1 mg twice daily to 3 times daily) -pt is also on Imdur -prn IV  labetalol  Left-sided weakness and numbness: possible slurred speech. per  EDP's note, pt has "Visual field deficit (upper bilateral visual field)" in ED. EDP discussed with Dr. Leonel Ramsay for neurology.  Will do TIA work-up. - Obtain MRI/MRA  - Check carotid dopplers  - ASA  - fasting lipid panel and HbA1c  - 2D transthoracic echocardiography  - swallowing screen. If fails, will get SLP - Check UDS  - PT/OT consult  Depression and anxiety: Stable, no suicidal or homicidal ideations. -Continue home medications: Prozac hydroxyzine -hold Ingre[place]a since we do not have this med per pharm  Chronic kidney disease, stage III 3b: renal fx is closed to baseline.  Recent baseline creatinine 2.0- 2.2.  Her creatinine is 2.13, BUN 28 today. -f/u by BMP  Microcytic anemia: -Continue iron supplement  Cigarette smoker: -Did counseling about importance of quitting smoking -Nicotine patch and Chantix  Chronic diastolic CHF (congestive heart failure) ([place]): 2D echo on 06/21/2018 showed EF 60-65% with grade 1 diastolic dysfunction.  Patient does not have leg edema or JVD.  Chest x-ray showed mild vascular congestion, but the patient does not have shortness of breath.  CHF seem to be compensated. -Check BNP  GERD (gastroesophageal reflux disease): -protonix    DVT ppx:  SQ Lovenox Code Status: Full code Family Communication: None at bed side.  Disposition Plan:  Anticipate discharge back to previous home environment Consults called: Dr. Leonel Ramsay of neurology  Admission status: Obs / tele    Date of Service 05/09/2019    [facility]   If 7PM-7AM, please contact night-coverage [email or web address] Password [email or web address] 05/09/2019, 3:57 AM

## 2019-05-09 NOTE — Progress Notes (Signed)
  Echocardiogram 2D Echocardiogram has been performed.  Kalani Sthilaire L Androw 05/09/2019, 11:56 AM

## 2019-05-09 NOTE — TOC Benefit Eligibility Note (Signed)
Transition of Care [facility]) Benefit Eligibility Note    Patient Details  Name: Shikara Mcauliffe MRN: [id number] Date of Birth: 12-26-1955   Medication/Dose: CLONIDINE  0.2  NG PATCH  WEEKLY  Covered?: Yes  Tier: (TIER- 4 DRUG)  Prescription Coverage Preferred Pharmacy: [facility] with Person/Company/Phone Number:: DENISE  @ [employer] # [phone]  Co-Pay: Johnsie Kindred  Prior Approval: No  Deductible: (CATASTROPHIC PHASE FOUR)  Additional Notes: SECONDARY INS: MEDICAID [place] , EFF-DATE 12-11-2017,  CO-PAY- $3.90 FOR EACH PRESCRIPTION    Memory Argue Phone Number: 05/09/2019, 1:43 PM

## 2019-05-10 ENCOUNTER — Inpatient Hospital Stay (HOSPITAL_COMMUNITY): Payer: Medicare HMO

## 2019-05-10 ENCOUNTER — Ambulatory Visit: Payer: Medicare HMO | Admitting: Physical Therapy

## 2019-05-10 DIAGNOSIS — I161 Hypertensive emergency: Secondary | ICD-10-CM | POA: Diagnosis not present

## 2019-05-10 DIAGNOSIS — I5032 Chronic diastolic (congestive) heart failure: Secondary | ICD-10-CM | POA: Diagnosis not present

## 2019-05-10 DIAGNOSIS — N1832 Chronic kidney disease, stage 3b: Secondary | ICD-10-CM | POA: Diagnosis not present

## 2019-05-10 LAB — CBC
HCT: 35.5 % — ABNORMAL LOW (ref 36.0–46.0)
Hemoglobin: 10.9 g/dL — ABNORMAL LOW (ref 12.0–15.0)
MCH: 27.9 pg (ref 26.0–34.0)
MCHC: 30.7 g/dL (ref 30.0–36.0)
MCV: 90.8 fL (ref 80.0–100.0)
Platelets: 265 10*3/uL (ref 150–400)
RBC: 3.91 MIL/uL (ref 3.87–5.11)
RDW: 17.2 % — ABNORMAL HIGH (ref 11.5–15.5)
WBC: 5.2 10*3/uL (ref 4.0–10.5)
nRBC: 0 % (ref 0.0–0.2)

## 2019-05-10 LAB — BASIC METABOLIC PANEL
Anion gap: 12 (ref 5–15)
BUN: 32 mg/dL — ABNORMAL HIGH (ref 8–23)
CO2: 20 mmol/L — ABNORMAL LOW (ref 22–32)
Calcium: 9.2 mg/dL (ref 8.9–10.3)
Chloride: 105 mmol/L (ref 98–111)
Creatinine, Ser: 2.14 mg/dL — ABNORMAL HIGH (ref 0.44–1.00)
GFR calc Af Amer: 28 mL/min — ABNORMAL LOW (ref >60)
GFR calc non Af Amer: 24 mL/min — ABNORMAL LOW (ref >60)
Glucose, Bld: 102 mg/dL — ABNORMAL HIGH (ref 70–99)
Potassium: 5.2 mmol/L — ABNORMAL HIGH (ref 3.5–5.1)
Sodium: 137 mmol/L (ref 135–145)

## 2019-05-10 MED ORDER — DIPHENHYDRAMINE HCL 50 MG/ML IJ SOLN
25.0000 mg | Freq: Once | INTRAMUSCULAR | Status: AC
  Administered 2019-05-10: 25 mg via INTRAVENOUS
  Filled 2019-05-10: qty 1

## 2019-05-10 MED ORDER — ONDANSETRON HCL 4 MG/2ML IJ SOLN
4.0000 mg | Freq: Four times a day (QID) | INTRAMUSCULAR | Status: DC | PRN
  Administered 2019-05-10: 4 mg via INTRAVENOUS
  Filled 2019-05-10: qty 2

## 2019-05-10 MED ORDER — ATORVASTATIN CALCIUM 40 MG PO TABS
40.0000 mg | ORAL_TABLET | Freq: Every day | ORAL | Status: DC
  Administered 2019-05-10: 40 mg via ORAL
  Filled 2019-05-10: qty 1

## 2019-05-10 MED ORDER — PROCHLORPERAZINE EDISYLATE 10 MG/2ML IJ SOLN
10.0000 mg | Freq: Once | INTRAMUSCULAR | Status: AC
  Administered 2019-05-10: 10 mg via INTRAVENOUS
  Filled 2019-05-10: qty 2

## 2019-05-10 MED ORDER — LABETALOL HCL 200 MG PO TABS
200.0000 mg | ORAL_TABLET | Freq: Two times a day (BID) | ORAL | Status: DC
  Administered 2019-05-10 – 2019-05-11 (×3): 200 mg via ORAL
  Filled 2019-05-10 (×3): qty 1

## 2019-05-10 MED ORDER — TERAZOSIN HCL 5 MG PO CAPS
5.0000 mg | ORAL_CAPSULE | Freq: Every day | ORAL | Status: DC
  Administered 2019-05-10: 5 mg via ORAL
  Filled 2019-05-10: qty 1

## 2019-05-10 MED ORDER — GABAPENTIN 600 MG PO TABS
600.0000 mg | ORAL_TABLET | Freq: Two times a day (BID) | ORAL | Status: DC
  Administered 2019-05-11: 600 mg via ORAL
  Filled 2019-05-10: qty 1

## 2019-05-10 MED ORDER — PROMETHAZINE HCL 25 MG/ML IJ SOLN
25.0000 mg | Freq: Once | INTRAMUSCULAR | Status: AC
  Administered 2019-05-10: 25 mg via INTRAVENOUS
  Filled 2019-05-10: qty 1

## 2019-05-10 MED ORDER — CARVEDILOL 12.5 MG PO TABS
12.5000 mg | ORAL_TABLET | Freq: Two times a day (BID) | ORAL | Status: DC
  Administered 2019-05-10: 12.5 mg via ORAL
  Filled 2019-05-10: qty 1

## 2019-05-10 NOTE — Progress Notes (Signed)
PROGRESS NOTE    Sherry [name]  ZOX:[id number] DOB: 06-Mar-1956 DOA: 05/08/2019 PCP: Sherry Moron, MD      Brief Narrative:  Sherry [name] is a 63 y.o. F with dCHF, HTN, depression, BrCA s/p lumpectomy, and IDA who presented with high blood pressure from Cardiologist's office.  Also noted few days headache and left-sided weakness/numbness.  In the ER, BP 232/109 mmHg and given IV labetalol, amlodipine, HCTZ and hydralazine.  Troponin 6, CXR with congestion.         Assessment & Plan:  Hypertensive emergency Presented with BP >230/105 mmHg and neurologic symptoms.  BP reduced ~20% in first 24 hours, oral agents subsequently restarted.  She reports intolerance of clonidine.    BP 150s during day yesterday, back to 190s overnight -Continue amlodipine, hydralazine, Imdur -Increase terazosin -HCTZ replaced with furosemdie -Clonidine changed to labetalol -Nephrology consult re: antihypertensive mgmt in CKD IV as well as ?secondary causes of hypertension, appreciate expert insights -UDS pending -Outpatient sleep study recommended   Suspected right small vessel TIA from hypertension MRI brain negative for stroke, mass.   -Echocardiogram showed no source of embolism, showed normal EF, valves -Carotid imaging <50% stenosis -Lipids ordered: LDL 188 >> increase Lipitor 40 daily, outpatient titration as able -A1c 5.4% -Aspirin 81 mg daily started -Atrial fibrillation: not present this admission  -tPA not given because outside window -PT recommend HHPT and rollator  CKD IIIb Baseline Cr 2-2.1, stable relative to baseline  Depression/anxiety -Continue Prozac   Neuropathy -Continue gabapentin  Microcytic anemia -Continue iron   Smoking Cessation strongly recommended, modalities discussed -Continue varenicline  Chronic diastolic CHF Fluid status hard to tell with habitus, no dyspnea/orthopnea. -Continue new Lasix  Troponin elevation Demand ischemia, doubt  ACS         DVT prophylaxis: Lovenox Code Status: FULL Family Communication:  MDM and disposition Plan:  The below labs and imaging reports reviewed and summarized above.  Medication management as above.   The patient was admitted with hypertensive emergency.  The patient presented with severe range hypertension with neurological changes.  She has still had severe range pressures within the last 24 hours, and we continue actively titrating BP meds and work up for secondary causes of hypertension.  Continued inpatient services are reasonable and expected at this time given the patient's severity of presentation (BP >200/110 with neurological changes, troponin elevation), co-morbid heart failure, chronic kidney disease, and need for close monitoring and blood pressure reduction in a controlled manner, as well as the high likelihood of an adverse outcome, including readmission, debility or death if the patient were to be discharged prematurely.          Subjective: Had headache, still severe.  Has nausea and indigestion overnight.  No focal weankess, no confusion, no LOC, no vomiting.      Objective: Vitals:   05/09/19 2223 05/10/19 0455 05/10/19 0843 05/10/19 1700  BP: (!) 180/75 (!) 192/77 (!) 151/57 (!) 135/48  Pulse: 69 81 65 66  Resp: _0 Temp: 98.8 F (37.1 C) 98.1 F (36.7 C) 98.8 F (37.1 C) 98.4 F (36.9 C)  TempSrc: Oral Oral Oral Oral  SpO2: 100% 94% 100% 100%  Weight:      Height:        Intake/Output Summary (Last 24 hours) at 05/10/2019 1737 Last data filed at 05/10/2019 1300 Gross per 24 hour  Intake 420 ml  Output --  Net 420 ml   Filed Weights   05/08/19 1602  Weight: 109.8 kg    Examination: General appearance: obese adult female, alert and in no acute distress.   HEENT: Anicteric, conjunctiva pink, lids and lashes normal. No nasal deformity, discharge, epistaxis.  Lips moist, edentulous. OP moist, edentulous, no oral lesions.    Skin: Warm and dry.  No suspicious rashes or lesions. Cardiac: RRR, no murmurs appreciated.  No LE edema.    Respiratory: Normal respiratory rate and rhythm.  CTAB without rales or wheezes. Abdomen: Abdomen soft.  No TTP or guarding. No ascites, distension, hepatosplenomegaly.   MSK: No deformities or effusions of the large joints of the upper or lower extremities bilaterally. Neuro: Awake and alert. Naming is grossly intact, and the patient's recall, recent and remote, as well as general fund of knowledge seem within normal limits.  Muscle tone normal, without fasciculations.  Moves all extremities equally and with normal coordination.  Marland Kitchen Speech fluent.    Psych: Sensorium intact and responding to questions, attention normal. Affect normal.  Judgment and insight appear normal.      Data Reviewed: I have personally reviewed following labs and imaging studies:  CBC: Recent Labs  Lab 05/08/19 1711 05/10/19 0711  WBC 6.7 5.2  NEUTROABS 4.2  --   HGB 11.6* 10.9*  HCT 38.0 35.5*  MCV 91.6 90.8  PLT 322 191   Basic Metabolic Panel: Recent Labs  Lab 05/08/19 1711 05/10/19 0509  NA 141 137  K 5.1 5.2*  CL 108 105  CO2 23 20*  GLUCOSE 102* 102*  BUN 28* 32*  CREATININE 2.13* 2.14*  CALCIUM 9.3 9.2   GFR: Estimated Creatinine Clearance: 31.4 mL/min (A) (by C-G formula based on SCr of 2.14 mg/dL (H)). Liver Function Tests: Recent Labs  Lab 05/08/19 1711  AST 13*  ALT 11  ALKPHOS 109  BILITOT 0.3  PROT 7.9  ALBUMIN 4.2   No results for input(s): LIPASE, AMYLASE in the last 168 hours. No results for input(s): AMMONIA in the last 168 hours. Coagulation Profile: No results for input(s): INR, PROTIME in the last 168 hours. Cardiac Enzymes: No results for input(s): CKTOTAL, CKMB, CKMBINDEX, TROPONINI in the last 168 hours. BNP (last 3 results) No results for input(s): PROBNP in the last 8760 hours. HbA1C: Recent Labs    05/09/19 0547  HGBA1C 5.4   CBG: No results  for input(s): GLUCAP in the last 168 hours. Lipid Profile: Recent Labs    05/09/19 0547  CHOL 266*  HDL 55  LDLCALC 188*  TRIG 117  CHOLHDL 4.8   Thyroid Function Tests: No results for input(s): TSH, T4TOTAL, FREET4, T3FREE, THYROIDAB in the last 72 hours. Anemia Panel: No results for input(s): VITAMINB12, FOLATE, FERRITIN, TIBC, IRON, RETICCTPCT in the last 72 hours. Urine analysis:    Component Value Date/Time   COLORURINE YELLOW 06/27/2018 1409   APPEARANCEUR CLEAR 06/27/2018 1409   LABSPEC 1.015 06/27/2018 1409   PHURINE 5.0 06/27/2018 1409   GLUCOSEU NEGATIVE 06/27/2018 1409   HGBUR NEGATIVE 06/27/2018 1409   BILIRUBINUR NEGATIVE 06/27/2018 1409   KETONESUR NEGATIVE 06/27/2018 1409   PROTEINUR 100 (A) 06/27/2018 1409   UROBILINOGEN 0.2 01/12/2010 1941   NITRITE NEGATIVE 06/27/2018 1409   LEUKOCYTESUR NEGATIVE 06/27/2018 1409   Sepsis Labs: _0 (procalcitonin:4,lacticacidven:4)  ) Recent Results (from the past 240 hour(s))  SARS CORONAVIRUS 2 (TAT 6-24 HRS) Nasopharyngeal Nasopharyngeal Swab     Status: None   Collection Time: 05/08/19  7:52 PM   Specimen: Nasopharyngeal Swab  Result Value Ref Range Status  SARS Coronavirus 2 NEGATIVE NEGATIVE Final    Comment: (NOTE) SARS-CoV-2 target nucleic acids are NOT DETECTED. The SARS-CoV-2 RNA is generally detectable in upper and lower respiratory specimens during the acute phase of infection. Negative results do not preclude SARS-CoV-2 infection, do not rule out co-infections with other pathogens, and should not be used as the sole basis for treatment or other patient management decisions. Negative results must be combined with clinical observations, patient history, and epidemiological information. The expected result is Negative. Fact Sheet for Patients: SugarRoll.be Fact Sheet for Healthcare Providers: https://www.woods-mathews.com/ This test is not yet approved or  cleared by the [place] FDA and  has been authorized for detection and/or diagnosis of SARS-CoV-2 by FDA under an Emergency Use Authorization (EUA). This EUA will remain  in effect (meaning this test can be used) for the duration of the COVID-19 declaration under Section 56 4(b)(1) of the Act, 21 U.S.C. section 360bbb-3(b)(1), unless the authorization is terminated or revoked sooner. Performed at [facility], [place] [address] [place], [place] [address]          Radiology Studies: Dg Chest 2 View  Result Date: 05/08/2019 CLINICAL DATA:  Back pain and hypertension EXAM: CHEST - 2 VIEW COMPARISON:  04/25/2019 FINDINGS: Heart size is enlarged. There is atelectasis at the lung bases. The lung volumes are somewhat low. There is no pneumothorax. No large pleural effusion. There is some mild vascular congestion. IMPRESSION: Cardiomegaly with mild vascular congestion. Electronically Signed   By: Constance Holster M.D.   On: 05/08/2019 18:15   Dg Abd 1 View  Result Date: 05/10/2019 CLINICAL DATA:  Diffuse abdominal pain EXAM: ABDOMEN - 1 VIEW COMPARISON:  12/31/2018 FINDINGS: Prior cholecystectomy. Moderate stool burden throughout the colon. No bowel obstruction or free air. No organomegaly or suspicious calcification. IMPRESSION: Moderate stool burden.  No acute findings. Electronically Signed   By: Rolm Baptise M.D.   On: 05/10/2019 11:15   Ct Head Wo Contrast  Result Date: 05/08/2019 CLINICAL DATA:  Headache, LEFT-sided numbness intermittently, history uncontrolled hypertension, CHF, advanced renal insufficiency, breast cancer EXAM: CT HEAD WITHOUT CONTRAST TECHNIQUE: Contiguous axial images were obtained from the base of the skull through the vertex without intravenous contrast. Sagittal and coronal MPR images reconstructed from axial data set. COMPARISON:  09/03/2018 FINDINGS: Brain: Normal ventricular morphology. No midline shift or mass effect. Small vessel chronic ischemic  changes of deep cerebral white matter. No intracranial hemorrhage, mass lesion, or evidence of acute infarction. No extra-axial fluid collections. Vascular: No hyperdense vessels. Atherosclerotic calcification of internal carotid arteries at skull base. Skull: Intact Sinuses/Orbits: Clear Other: N/A IMPRESSION: Small vessel chronic ischemic changes of deep cerebral white matter. No acute intracranial abnormalities. Electronically Signed   By: Lavonia Dana M.D.   On: 05/08/2019 18:03   Mr Angio Head Wo Contrast  Result Date: 05/09/2019 CLINICAL DATA:  TIA, initial exam. Headache with left-sided numbness intermittently EXAM: MRI HEAD WITHOUT CONTRAST MRA HEAD WITHOUT CONTRAST TECHNIQUE: Multiplanar, multiecho pulse sequences of the brain and surrounding structures were obtained without intravenous contrast. Angiographic images of the head were obtained using MRA technique without contrast. COMPARISON:  04/06/2019 FINDINGS: MRI HEAD FINDINGS Brain: No acute infarction, hemorrhage, hydrocephalus, extra-axial collection or mass lesion. FLAIR hyperintensity in the periventricular white matter and to a mild extent in the pons, stable from comparison and likely from chronic small vessel ischemia. Age normal brain volume Vascular: Arterial findings below. Normal dural venous sinus flow voids Skull and upper cervical spine:  Voids negative for marrow lesion. Partial visualization of an ACDF plate Sinuses/Orbits: Negative Other: Motion degraded MRA HEAD FINDINGS Vertebral, basilar, and carotid arteries are patent. There is mild irregularity and narrowing at the bilateral anterior genu of the ICA, likely from flow artifact and motion. Extensive medium size vessel irregularity including high-grade bilateral M2 segment stenoses and high-grade right P1 segment stenosis. This is likely atherosclerosis accentuated by motion. No definite poststenotic beading to imply a vasculitis in this patient with headache. Fetal type left PCA.  Negative for aneurysm or evident vascular malformation IMPRESSION: Brain MRI: 1. No acute finding or change from last month. 2. Mild presumed chronic small vessel ischemia. 3. Motion degraded. Intracranial MRA: Extensive irregularity of medium size vessels, likely advanced intracranial atherosclerosis. The narrowings are not associated with beading to strongly implicate a vasculitis/vasculopathy in this patient with history of headache. The most notable high-grade narrowings involve bilateral M2 branches in the right P1 segment. Electronically Signed   By: Monte Fantasia M.D.   On: 05/09/2019 09:14   Mr Brain Wo Contrast  Result Date: 05/09/2019 CLINICAL DATA:  TIA, initial exam. Headache with left-sided numbness intermittently EXAM: MRI HEAD WITHOUT CONTRAST MRA HEAD WITHOUT CONTRAST TECHNIQUE: Multiplanar, multiecho pulse sequences of the brain and surrounding structures were obtained without intravenous contrast. Angiographic images of the head were obtained using MRA technique without contrast. COMPARISON:  04/06/2019 FINDINGS: MRI HEAD FINDINGS Brain: No acute infarction, hemorrhage, hydrocephalus, extra-axial collection or mass lesion. FLAIR hyperintensity in the periventricular white matter and to a mild extent in the pons, stable from comparison and likely from chronic small vessel ischemia. Age normal brain volume Vascular: Arterial findings below. Normal dural venous sinus flow voids Skull and upper cervical spine: Voids negative for marrow lesion. Partial visualization of an ACDF plate Sinuses/Orbits: Negative Other: Motion degraded MRA HEAD FINDINGS Vertebral, basilar, and carotid arteries are patent. There is mild irregularity and narrowing at the bilateral anterior genu of the ICA, likely from flow artifact and motion. Extensive medium size vessel irregularity including high-grade bilateral M2 segment stenoses and high-grade right P1 segment stenosis. This is likely atherosclerosis accentuated by  motion. No definite poststenotic beading to imply a vasculitis in this patient with headache. Fetal type left PCA. Negative for aneurysm or evident vascular malformation IMPRESSION: Brain MRI: 1. No acute finding or change from last month. 2. Mild presumed chronic small vessel ischemia. 3. Motion degraded. Intracranial MRA: Extensive irregularity of medium size vessels, likely advanced intracranial atherosclerosis. The narrowings are not associated with beading to strongly implicate a vasculitis/vasculopathy in this patient with history of headache. The most notable high-grade narrowings involve bilateral M2 branches in the right P1 segment. Electronically Signed   By: Monte Fantasia M.D.   On: 05/09/2019 09:14   Vas US Carotid (at [place] Only)  Result Date: 05/09/2019 Carotid Arterial Duplex Study Indications:       TIA. Risk Factors:      Hypertension. Limitations        Today's exam was limited due to the body habitus of the                    patient, the high bifurcation of the carotid and the                    patient's respiratory variation. Comparison Study:  No prior study. Performing Technologist: Maudry Mayhew MHA, RDMS, RVT, RDCS  Examination Guidelines: A complete evaluation includes B-mode imaging, spectral Doppler,  color Doppler, and power Doppler as needed of all accessible portions of each vessel. Bilateral testing is considered an integral part of a complete examination. Limited examinations for reoccurring indications may be performed as noted.  Right Carotid Findings: +----------+--------+--------+--------+-----------------------+--------+             PSV cm/s EDV cm/s Stenosis Plaque Description      Comments  +----------+--------+--------+--------+-----------------------+--------+  CCA Prox   112      15                smooth and homogeneous            +----------+--------+--------+--------+-----------------------+--------+  CCA Distal 65       14                smooth and  homogeneous            +----------+--------+--------+--------+-----------------------+--------+  ICA Prox   81       22                smooth and heterogenous           +----------+--------+--------+--------+-----------------------+--------+  ICA Distal 65       27                                                  +----------+--------+--------+--------+-----------------------+--------+  ECA        93       11                                                  +----------+--------+--------+--------+-----------------------+--------+ +----------+--------+-------+----------------+-------------------+             PSV cm/s EDV cms Describe         Arm Pressure (mmHG)  +----------+--------+-------+----------------+-------------------+  Subclavian 184              Multiphasic, WNL                      +----------+--------+-------+----------------+-------------------+ +---------+--------+--+--------+--+---------+  Vertebral PSV cm/s 52 EDV cm/s 21 Antegrade  +---------+--------+--+--------+--+---------+  Left Carotid Findings: +----------+--------+--------+--------+------------------+--------+             PSV cm/s EDV cm/s Stenosis Plaque Description Comments  +----------+--------+--------+--------+------------------+--------+  CCA Prox   120      20                                             +----------+--------+--------+--------+------------------+--------+  CCA Distal 98       26                                             +----------+--------+--------+--------+------------------+--------+  ICA Prox   117      32                                             +----------+--------+--------+--------+------------------+--------+  ICA Distal 75       24                                             +----------+--------+--------+--------+------------------+--------+  ECA        161      21                                             +----------+--------+--------+--------+------------------+--------+  +----------+--------+--------+----------------+-------------------+             PSV cm/s EDV cm/s Describe         Arm Pressure (mmHG)  +----------+--------+--------+----------------+-------------------+  Subclavian 139               Multiphasic, WNL                      +----------+--------+--------+----------------+-------------------+ +---------+--------+--+--------+--+---------+  Vertebral PSV cm/s 55 EDV cm/s 19 Antegrade  +---------+--------+--+--------+--+---------+  Summary: Right Carotid: Velocities in the right ICA are consistent with a 1-39% stenosis. Left Carotid: Velocities in the left ICA are consistent with a 1-39% stenosis. Vertebrals:  Bilateral vertebral arteries demonstrate antegrade flow. Subclavians: Normal flow hemodynamics were seen in bilateral subclavian              arteries. *See table(s) above for measurements and observations.  Electronically signed by Deitra Mayo MD on 05/09/2019 at 3:08:44 PM.    Final         Scheduled Meds:  acetaminophen  650 mg Oral TID   amLODipine  10 mg Oral Daily   aspirin  81 mg Oral Daily   atorvastatin  40 mg Oral q1800   enoxaparin (LOVENOX) injection  40 mg Subcutaneous Q24H   ferrous sulfate  325 mg Oral Q breakfast   FLUoxetine  40 mg Oral Daily   furosemide  20 mg Oral Daily   gabapentin  600 mg Oral TID   hydrALAZINE  100 mg Oral TID   isosorbide mononitrate  60 mg Oral Daily   labetalol  200 mg Oral BID   lidocaine  1 patch Transdermal Q24H   multivitamin with minerals  1 tablet Oral Daily   nicotine  21 mg Transdermal Daily   oxybutynin  5 mg Oral QHS   pantoprazole  40 mg Oral BID   rOPINIRole  4 mg Oral QHS   terazosin  5 mg Oral QHS   varenicline  1 mg Oral BID   Continuous Infusions:  sodium chloride 500 mL (05/08/19 1723)     LOS: 1 day    Time spent: 25 minutes    Edwin Dada, MD [facility] 05/10/2019, 5:37 PM     Pager [phone] --- please page  though AMION:  [email or web address] If 7PM-7AM, please contact night-coverage

## 2019-05-10 NOTE — Progress Notes (Signed)
STROKE TEAM PROGRESS NOTE     INTERVAL HISTORY Patient states that she slept much better last night after starting Requip and her restless leg symptoms are also much improved.  She is feeling better overall today.  Physical therapy recommends home health therapies.  Carotid ultrasound showed bilateral no significant stenosis.  Transthoracic echo is unremarkable. OBJECTIVE Vitals:   05/09/19 2223 05/10/19 0455 05/10/19 0843 05/10/19 1700  BP: (!) 180/75 (!) 192/77 (!) 151/57 (!) 135/48  Pulse: 69 81 65 66  Resp: 20 18 18 18   Temp: 98.8 F (37.1 C) 98.1 F (36.7 C) 98.8 F (37.1 C) 98.4 F (36.9 C)  TempSrc: Oral Oral Oral Oral  SpO2: 100% 94% 100% 100%  Weight:      Height:        CBC:  Recent Labs  Lab 05/08/19 1711 05/10/19 0711  WBC 6.7 5.2  NEUTROABS 4.2  --   HGB 11.6* 10.9*  HCT 38.0 35.5*  MCV 91.6 90.8  PLT 322 626    Basic Metabolic Panel:  Recent Labs  Lab 05/08/19 1711 05/10/19 0509  NA 141 137  K 5.1 5.2*  CL 108 105  CO2 23 20*  GLUCOSE 102* 102*  BUN 28* 32*  CREATININE 2.13* 2.14*  CALCIUM 9.3 9.2    Lipid Panel:     Component Value Date/Time   CHOL 266 (H) 05/09/2019 0547   TRIG 117 05/09/2019 0547   HDL 55 05/09/2019 0547   CHOLHDL 4.8 05/09/2019 0547   VLDL 23 05/09/2019 0547   LDLCALC 188 (H) 05/09/2019 0547   HgbA1c:  Lab Results  Component Value Date   HGBA1C 5.4 05/09/2019   Urine Drug Screen:     Component Value Date/Time   LABOPIA NONE DETECTED 06/27/2018 1409   COCAINSCRNUR NONE DETECTED 06/27/2018 1409   LABBENZ NONE DETECTED 06/27/2018 1409   AMPHETMU NONE DETECTED 06/27/2018 1409   THCU NONE DETECTED 06/27/2018 1409   LABBARB NONE DETECTED 06/27/2018 1409    Alcohol Level     Component Value Date/Time   ETH <10 08/09/2017 1132    IMAGING   Dg Chest 2 View  Result Date: 05/08/2019 CLINICAL DATA:  Back pain and hypertension EXAM: CHEST - 2 VIEW COMPARISON:  04/25/2019 FINDINGS: Heart size is enlarged.  There is atelectasis at the lung bases. The lung volumes are somewhat low. There is no pneumothorax. No large pleural effusion. There is some mild vascular congestion. IMPRESSION: Cardiomegaly with mild vascular congestion. Electronically Signed   By: Constance Holster M.D.   On: 05/08/2019 18:15   Dg Abd 1 View  Result Date: 05/10/2019 CLINICAL DATA:  Diffuse abdominal pain EXAM: ABDOMEN - 1 VIEW COMPARISON:  12/31/2018 FINDINGS: Prior cholecystectomy. Moderate stool burden throughout the colon. No bowel obstruction or free air. No organomegaly or suspicious calcification. IMPRESSION: Moderate stool burden.  No acute findings. Electronically Signed   By: Rolm Baptise M.D.   On: 05/10/2019 11:15   Ct Head Wo Contrast  Result Date: 05/08/2019 CLINICAL DATA:  Headache, LEFT-sided numbness intermittently, history uncontrolled hypertension, CHF, advanced renal insufficiency, breast cancer EXAM: CT HEAD WITHOUT CONTRAST TECHNIQUE: Contiguous axial images were obtained from the base of the skull through the vertex without intravenous contrast. Sagittal and coronal MPR images reconstructed from axial data set. COMPARISON:  09/03/2018 FINDINGS: Brain: Normal ventricular morphology. No midline shift or mass effect. Small vessel chronic ischemic changes of deep cerebral white matter. No intracranial hemorrhage, mass lesion, or evidence of acute infarction. No extra-axial  fluid collections. Vascular: No hyperdense vessels. Atherosclerotic calcification of internal carotid arteries at skull base. Skull: Intact Sinuses/Orbits: Clear Other: N/A IMPRESSION: Small vessel chronic ischemic changes of deep cerebral white matter. No acute intracranial abnormalities. Electronically Signed   By: Lavonia Dana M.D.   On: 05/08/2019 18:03   Mr Angio Head Wo Contrast  Result Date: 05/09/2019 CLINICAL DATA:  TIA, initial exam. Headache with left-sided numbness intermittently EXAM: MRI HEAD WITHOUT CONTRAST MRA HEAD WITHOUT  CONTRAST TECHNIQUE: Multiplanar, multiecho pulse sequences of the brain and surrounding structures were obtained without intravenous contrast. Angiographic images of the head were obtained using MRA technique without contrast. COMPARISON:  04/06/2019 FINDINGS: MRI HEAD FINDINGS Brain: No acute infarction, hemorrhage, hydrocephalus, extra-axial collection or mass lesion. FLAIR hyperintensity in the periventricular white matter and to a mild extent in the pons, stable from comparison and likely from chronic small vessel ischemia. Age normal brain volume Vascular: Arterial findings below. Normal dural venous sinus flow voids Skull and upper cervical spine: Voids negative for marrow lesion. Partial visualization of an ACDF plate Sinuses/Orbits: Negative Other: Motion degraded MRA HEAD FINDINGS Vertebral, basilar, and carotid arteries are patent. There is mild irregularity and narrowing at the bilateral anterior genu of the ICA, likely from flow artifact and motion. Extensive medium size vessel irregularity including high-grade bilateral M2 segment stenoses and high-grade right P1 segment stenosis. This is likely atherosclerosis accentuated by motion. No definite poststenotic beading to imply a vasculitis in this patient with headache. Fetal type left PCA. Negative for aneurysm or evident vascular malformation IMPRESSION: Brain MRI: 1. No acute finding or change from last month. 2. Mild presumed chronic small vessel ischemia. 3. Motion degraded. Intracranial MRA: Extensive irregularity of medium size vessels, likely advanced intracranial atherosclerosis. The narrowings are not associated with beading to strongly implicate a vasculitis/vasculopathy in this patient with history of headache. The most notable high-grade narrowings involve bilateral M2 branches in the right P1 segment. Electronically Signed   By: Monte Fantasia M.D.   On: 05/09/2019 09:14   Mr Brain Wo Contrast  Result Date: 05/09/2019 CLINICAL DATA:   TIA, initial exam. Headache with left-sided numbness intermittently EXAM: MRI HEAD WITHOUT CONTRAST MRA HEAD WITHOUT CONTRAST TECHNIQUE: Multiplanar, multiecho pulse sequences of the brain and surrounding structures were obtained without intravenous contrast. Angiographic images of the head were obtained using MRA technique without contrast. COMPARISON:  04/06/2019 FINDINGS: MRI HEAD FINDINGS Brain: No acute infarction, hemorrhage, hydrocephalus, extra-axial collection or mass lesion. FLAIR hyperintensity in the periventricular white matter and to a mild extent in the pons, stable from comparison and likely from chronic small vessel ischemia. Age normal brain volume Vascular: Arterial findings below. Normal dural venous sinus flow voids Skull and upper cervical spine: Voids negative for marrow lesion. Partial visualization of an ACDF plate Sinuses/Orbits: Negative Other: Motion degraded MRA HEAD FINDINGS Vertebral, basilar, and carotid arteries are patent. There is mild irregularity and narrowing at the bilateral anterior genu of the ICA, likely from flow artifact and motion. Extensive medium size vessel irregularity including high-grade bilateral M2 segment stenoses and high-grade right P1 segment stenosis. This is likely atherosclerosis accentuated by motion. No definite poststenotic beading to imply a vasculitis in this patient with headache. Fetal type left PCA. Negative for aneurysm or evident vascular malformation IMPRESSION: Brain MRI: 1. No acute finding or change from last month. 2. Mild presumed chronic small vessel ischemia. 3. Motion degraded. Intracranial MRA: Extensive irregularity of medium size vessels, likely advanced intracranial atherosclerosis. The narrowings are not  associated with beading to strongly implicate a vasculitis/vasculopathy in this patient with history of headache. The most notable high-grade narrowings involve bilateral M2 branches in the right P1 segment. Electronically Signed    By: Monte Fantasia M.D.   On: 05/09/2019 09:14   Vas US Carotid (at [place] Only)  Result Date: 05/09/2019 Carotid Arterial Duplex Study Indications:       TIA. Risk Factors:      Hypertension. Limitations        Today's exam was limited due to the body habitus of the                    patient, the high bifurcation of the carotid and the                    patient's respiratory variation. Comparison Study:  No prior study. Performing Technologist: Maudry Mayhew MHA, RDMS, RVT, RDCS  Examination Guidelines: A complete evaluation includes B-mode imaging, spectral Doppler, color Doppler, and power Doppler as needed of all accessible portions of each vessel. Bilateral testing is considered an integral part of a complete examination. Limited examinations for reoccurring indications may be performed as noted.  Right Carotid Findings: +----------+--------+--------+--------+-----------------------+--------+           PSV cm/sEDV cm/sStenosisPlaque Description     Comments +----------+--------+--------+--------+-----------------------+--------+ CCA Prox  112     15              smooth and homogeneous          +----------+--------+--------+--------+-----------------------+--------+ CCA Distal65      14              smooth and homogeneous          +----------+--------+--------+--------+-----------------------+--------+ ICA Prox  81      22              smooth and heterogenous         +----------+--------+--------+--------+-----------------------+--------+ ICA Distal65      27                                              +----------+--------+--------+--------+-----------------------+--------+ ECA       93      11                                              +----------+--------+--------+--------+-----------------------+--------+ +----------+--------+-------+----------------+-------------------+           PSV cm/sEDV cmsDescribe        Arm Pressure (mmHG)  +----------+--------+-------+----------------+-------------------+ [id number]            Multiphasic, WNL                    +----------+--------+-------+----------------+-------------------+ +---------+--------+--+--------+--+---------+ VertebralPSV cm/s52EDV cm/s21Antegrade +---------+--------+--+--------+--+---------+  Left Carotid Findings: +----------+--------+--------+--------+------------------+--------+           PSV cm/sEDV cm/sStenosisPlaque DescriptionComments +----------+--------+--------+--------+------------------+--------+ CCA Prox  120     20                                         +----------+--------+--------+--------+------------------+--------+ CCA Distal98      26                                         +----------+--------+--------+--------+------------------+--------+  ICA Prox  117     32                                         +----------+--------+--------+--------+------------------+--------+ ICA Distal75      24                                         +----------+--------+--------+--------+------------------+--------+ ECA       161     21                                         +----------+--------+--------+--------+------------------+--------+ +----------+--------+--------+----------------+-------------------+           PSV cm/sEDV cm/sDescribe        Arm Pressure (mmHG) +----------+--------+--------+----------------+-------------------+ [id number]             Multiphasic, WNL                    +----------+--------+--------+----------------+-------------------+ +---------+--------+--+--------+--+---------+ VertebralPSV cm/s55EDV cm/s19Antegrade +---------+--------+--+--------+--+---------+  Summary: Right Carotid: Velocities in the right ICA are consistent with a 1-39% stenosis. Left Carotid: Velocities in the left ICA are consistent with a 1-39% stenosis. Vertebrals:  Bilateral vertebral arteries demonstrate  antegrade flow. Subclavians: Normal flow hemodynamics were seen in bilateral subclavian              arteries. *See table(s) above for measurements and observations.  Electronically signed by Deitra Mayo MD on 05/09/2019 at 3:08:44 PM.    Final    Transthoracic Echocardiogram  Recent Results (from the past 43800 hour(s))  ECHOCARDIOGRAM COMPLETE   Collection Time: 05/09/19 11:56 AM  Result Value   Weight 3,872   Height 62   BP 183/86   Narrative     ECHOCARDIOGRAM REPORT       Patient Name:   ANAVICTORIA WILK Date of Exam: 05/09/2019 Medical Rec #:  [id number]    Height:       62.0 in Accession #:    [id number]   Weight:       242.0 lb Date of Birth:  1955-09-11    BSA:          2.07 m Patient Age:    35 years     BP:           183/86 mmHg Patient Gender: F            HR:           79 bpm. Exam Location:  Inpatient  Procedure: 2D Echo  Indications:    TIA (transient ischemic attack)   History:        Patient has prior history of Echocardiogram examinations, most                 recent 06/21/2018. CHF Signs/Symptoms:Dyspnea Risk                 Factors:Hypertension and Morbid obesity. CKD                 Alcohol abuse.   Sonographer:    Talmage Coin Referring Phys: [id number] Ivor Costa    Sonographer Comments: Patient pushing techs hand away when attempting subcostal window. IMPRESSIONS    1. Left ventricular ejection fraction,  by visual estimation, is 65 to 70%. The left ventricle has hyperdynamic function. Normal left ventricular size. There is mildly increased left ventricular hypertrophy.  2. Elevated mean left atrial pressure.  3. Left ventricular diastolic Doppler parameters are consistent with pseudonormalization pattern of LV diastolic filling.  4. Global right ventricle has normal systolic function.The right ventricular size is normal. No increase in right ventricular wall thickness.  5. Left atrial size was mildly dilated.  6. Right atrial size was normal.  7.  The mitral valve is normal in structure. No evidence of mitral valve regurgitation.  8. The tricuspid valve is normal in structure. Tricuspid valve regurgitation was not visualized by color flow Doppler.  9. The aortic valve is normal in structure. Aortic valve regurgitation was not visualized by color flow Doppler. 10. The pulmonic valve was not well visualized. Pulmonic valve regurgitation is trivial by color flow Doppler. 11. TR signal is inadequate for assessing pulmonary artery systolic pressure.  FINDINGS  Left Ventricle: Left ventricular ejection fraction, by visual estimation, is 65 to 70%. The left ventricle has hyperdynamic function. No evidence of left ventricular regional wall motion abnormalities. There is mildly increased left ventricular  hypertrophy. Concentric left ventricular hypertrophy. Normal left ventricular size. Spectral Doppler shows Left ventricular diastolic Doppler parameters are consistent with pseudonormalization pattern of LV diastolic filling. Elevated mean left atrial  pressure.  Right Ventricle: The right ventricular size is normal. No increase in right ventricular wall thickness. Global RV systolic function is has normal systolic function.  Left Atrium: Left atrial size was mildly dilated.  Right Atrium: Right atrial size was normal in size  Pericardium: There is no evidence of pericardial effusion.  Mitral Valve: The mitral valve is normal in structure. No evidence of mitral valve regurgitation.  Tricuspid Valve: The tricuspid valve is normal in structure. Tricuspid valve regurgitation was not visualized by color flow Doppler.  Aortic Valve: The aortic valve is normal in structure. Aortic valve regurgitation was not visualized by color flow Doppler. Aortic valve mean gradient measures 8.0 mmHg. Aortic valve peak gradient measures 13.0 mmHg. Aortic valve area, by VTI measures  2.77 cm.  Pulmonic Valve: The pulmonic valve was not well visualized. Pulmonic  valve regurgitation is trivial by color flow Doppler.  Aorta: The aortic root is normal in size and structure.  IAS/Shunts: No atrial level shunt detected by color flow Doppler.     LEFT VENTRICLE PLAX 2D LVIDd:         4.50 cm  Diastology LVIDs:         2.70 cm  LV e' lateral:   7.07 cm/s LV PW:         1.30 cm  LV E/e' lateral: 14.4 LV IVS:        1.30 cm  LV e' medial:    5.22 cm/s LVOT diam:     1.90 cm  LV E/e' medial:  19.5 LV SV:         65 ml LV SV Index:   29.00 LVOT Area:     2.84 cm    RIGHT VENTRICLE RV S prime:     20.90 cm/s TAPSE (M-mode): 2.4 cm  LEFT ATRIUM             Index       RIGHT ATRIUM           Index LA diam:        3.70 cm 1.78 cm/m  RA Area:     18.20  cm LA Vol (A2C):   69.8 ml 33.67 ml/m RA Volume:   49.10 ml  23.69 ml/m LA Vol (A4C):   67.4 ml 32.51 ml/m LA Biplane Vol: 72.1 ml 34.78 ml/m  AORTIC VALVE AV Area (Vmax):    2.50 cm AV Area (Vmean):   2.39 cm AV Area (VTI):     2.77 cm AV Vmax:           180.00 cm/s AV Vmean:          133.000 cm/s AV VTI:            0.348 m AV Peak Grad:      13.0 mmHg AV Mean Grad:      8.0 mmHg LVOT Vmax:         159.00 cm/s LVOT Vmean:        112.000 cm/s LVOT VTI:          0.340 m LVOT/AV VTI ratio: 0.98   AORTA Ao Root diam: 2.80 cm  MITRAL VALVE MV Area (PHT): 2.87 cm              SHUNTS MV PHT:        76.56 msec            Systemic VTI:  0.34 m MV Decel Time: 264 msec              Systemic Diam: 1.90 cm MV E velocity: 102.00 cm/s 103 cm/s MV A velocity: 118.00 cm/s 70.3 cm/s MV E/A ratio:  0.86        1.5    Mihai Croitoru MD Electronically signed by Sanda Klein MD Signature Date/Time: 05/09/2019/1:01:47 PM       Final     *Note: Due to a large number of results and/or encounters for the requested time period, some results have not been displayed. A complete set of results can be found in Results Review.   ECG - SR rate 70 BPM. (See cardiology reading for complete  details) PHYSICAL EXAM Blood pressure (!) 135/48, pulse 66, temperature 98.4 F (36.9 C), temperature source Oral, resp. rate 18, height 5\' 2"  (1.575 m), weight 109.8 kg, SpO2 100 %.  General: Appears well-developed middle-aged lady not in distress Psych: Affect appropriate to situation, somewhat anxious Eyes: No scleral injection HENT: No OP obstrucion Head: Normocephalic.  Cardiovascular: Normal rate and regular rhythm.  Respiratory: Effort normal and breath sounds normal to anterior ascultation GI: Soft. No distension. There is no tenderness.  Skin: Warm, dry  Neurological Examination Mental Status: Alert, oriented, thought content appropriate.  Speech fluent without evidence of aphasia. Able to follow 3 step commands without difficulty. Cranial Nerves: II: Visual fields grossly normal,  III,IV, VI: ptosis not present, extra-ocular motions intact bilaterally, pupils equal, round, reactive to light and accommodation V,VII: smile symmetric, facial light touch sensation normal bilaterally VIII: hearing normal bilaterally IX,X: uvula rises symmetrically XI: bilateral shoulder shrug XII: midline tongue extension Motor: Right :  Upper extremity   5/5                                      Left:     Upper extremity   5/5             Lower extremity   5/5  Lower extremity   5/5, bobs in air, but no drift.  Tone and bulk:normal tone throughout; no atrophy noted Sensory: reduced sensation to light touch  Plantars: Right: downgoing                                Left: downgoing Cerebellar: normal finger-to-nose, normal rapid alternating movements  Gait: normal gait and station  HOME MEDICATIONS:  Medications Prior to Admission  Medication Sig Dispense Refill  . amLODipine (NORVASC) 10 MG tablet TAKE 1 TABLET (10 MG TOTAL) BY MOUTH DAILY. 30 tablet 0  . ferrous sulfate 325 (65 FE) MG tablet Take 1 tablet (325 mg total) by mouth daily  with breakfast. 90 tablet 3  . FLUoxetine (PROZAC) 40 MG capsule Take 40 mg by mouth daily.     Marland Kitchen gabapentin (NEURONTIN) 600 MG tablet Take 1 tablet (600 mg total) by mouth 3 (three) times daily. 90 tablet 6  . hydrALAZINE (APRESOLINE) 25 MG tablet Take 25 mg by mouth 3 (three) times daily.    . hydrOXYzine (VISTARIL) 25 MG capsule Take 25 mg by mouth 4 (four) times daily as needed for anxiety.    . INGREZZA 80 MG CAPS Take 80 mg by mouth daily.    . isosorbide mononitrate (IMDUR) 60 MG 24 hr tablet TAKE 1 TABLET (60 MG TOTAL) BY MOUTH DAILY. 90 tablet 1  . Multiple Vitamins-Minerals (ADULT GUMMY) CHEW Chew 1 tablet by mouth daily.    . ondansetron (ZOFRAN) 4 MG tablet TAKE 1 TABLET BY MOUTH EVERY 8 HOURS UP TO 7 DAYS AS NEEDED FOR NAUSEA & VOMITING (Patient taking differently: Take 4 mg by mouth every 8 (eight) hours as needed for nausea or vomiting. ) 20 tablet 0  . pantoprazole (PROTONIX) 20 MG tablet Take 20 mg by mouth 2 (two) times daily.    Marland Kitchen terazosin (HYTRIN) 2 MG capsule Take 1 capsule (2 mg total) by mouth at bedtime. 90 capsule 1  . varenicline (CHANTIX CONTINUING MONTH PAK) 1 MG tablet Take 1 tablet (1 mg total) by mouth 2 (two) times daily. 60 tablet 6  . cloNIDine (CATAPRES) 0.1 MG tablet TAKE 1 TABLET (0.1 MG TOTAL) BY MOUTH 2 (TWO) TIMES DAILY. (Patient not taking: Reported on 05/09/2019) 60 tablet 2  . lidocaine (LIDODERM) 5 % Place 1 patch onto the skin daily. Remove & Discard patch within 12 hours or as directed by MD (Patient not taking: Reported on 05/09/2019) 30 patch 0  . nicotine (NICODERM CQ - DOSED IN MG/24 HOURS) 21 mg/24hr patch Place 1 patch (21 mg total) onto the skin daily. 28 patch 0  . oxybutynin (DITROPAN-XL) 5 MG 24 hr tablet TAKE 1 TABLET BY MOUTH AT BEDTIME (Patient not taking: Reported on 05/09/2019) 90 tablet 1  . pantoprazole (PROTONIX) 40 MG tablet Take 1 tablet (40 mg total) by mouth 2 (two) times daily. (Patient not taking: Reported on 05/09/2019) 90 tablet  3      HOSPITAL MEDICATIONS:  . acetaminophen  650 mg Oral TID  . amLODipine  10 mg Oral Daily  . aspirin  81 mg Oral Daily  . atorvastatin  40 mg Oral q1800  . enoxaparin (LOVENOX) injection  40 mg Subcutaneous Q24H  . ferrous sulfate  325 mg Oral Q breakfast  . FLUoxetine  40 mg Oral Daily  . furosemide  20 mg Oral Daily  . gabapentin  600 mg Oral TID  . hydrALAZINE  100 mg Oral TID  . isosorbide mononitrate  60 mg Oral Daily  . labetalol  200 mg Oral BID  . lidocaine  1 patch Transdermal Q24H  . multivitamin with minerals  1 tablet Oral Daily  . nicotine  21 mg Transdermal Daily  . oxybutynin  5 mg Oral QHS  . pantoprazole  40 mg Oral BID  . rOPINIRole  4 mg Oral QHS  . terazosin  5 mg Oral QHS  . varenicline  1 mg Oral BID    ALLERGIES Allergies  Allergen Reactions  . Abilify [Aripiprazole] Other (See Comments)    Tardive dyskinesia Oral  . Remeron [Mirtazapine] Other (See Comments)    Wgt stimulation /gain, Dizziness, Patient says "can tolerate"  . Trazodone And Nefazodone Other (See Comments)    Nightmares/sleep diturbance  . Flexeril [Cyclobenzaprine] Other (See Comments)    Pt states Flexeril makes her feel depressed   . Amoxicillin Diarrhea and Other (See Comments)    NOTE the patient has had PCN WITHOUT reaction Has patient had a PCN reaction causing immediate rash, facial/tongue/throat swelling, SOB or lightheadedness with hypotension: No Has patient had a PCN reaction causing severe rash involving mucus membranes or skin necrosis: No Has patient had a PCN reaction that required hospitalization: No Has patient had a PCN reaction occurring within the last 10 years: No If all of the above answers are "NO", then may proceed with Cephalosporin use.    ASSESSMENT/PLAN Ms. Akera Snowberger is a 63 y.o. female with history of hypertension, GERD, depression, anxiety, substance abuse, alcohol abuse, HCV, CKD stage III, breast cancer, systolic heart failure, medication  noncompliance presenting with h/a and left side weakness. She did not receive IV t-PA due to outside time window, mild sx.   Probable right brain subcortical from small vessel disease.  In the setting of hypertensive emergency  Resultant  H/a and left side weak  Code Stroke CT Head -    ASPECTS not done  CT head neg  MRI head-neg  MRA head - neg  CTA H&N - not done  CT Perfusion- not done  Carotid Doppler- no stensois bilat  2D Echo -65%, LVH, LA mild dilation, no thrombus  Sars Corona Virus 2  neg  LDL - 188    Component Value Date/Time   LDLCALC 188 (H) 05/09/2019 0547    HgbA1c - 5.4  UDS neg  VTE prophylaxis - lovenox Diet  Diet Order            Diet Heart Room service appropriate? Yes; Fluid consistency: Thin  Diet effective now              none prior to admission, now on ASA 81mg  PO  Patient counseled to be compliant with her antithrombotic medications  Ongoing aggressive stroke risk factor management  Therapy recommendations: Home health services Disposition: Home Hypertension  Home BP meds: Norvasc, Imdur, catapres  Current BP meds: norvasc, coreg, Imdur, apersoline,   HTN urgency initially, but now Stable . NO Permissive hypertension; no stroke . Long-term BP goal normotensive  Hyperlipidemia  Home Lipid lowering medication: none   LDL 188, goal < 100  Current lipid lowering medication: Lipitor 20 mg daily  Continue statin at discharge  Diabetes  Home diabetic meds: none  Current diabetic meds:none  HgbA1c wnl, goal < 7.0 No results for input(s): GLUCAP in the last 72 hours.  Other Stroke Risk Factors  Cigarette smoker; advised to stop smoking- she tells me she has tried chantix  ETOH use, advised to drink no more than 1 drink a day  Obesity, Body mass index is 44.26 kg/m., recommend weight loss, diet and exercise as appropriate   Coronary artery disease  Substance Abuse  Other Active Problems  RLS- will order  ropinirole 4mg  QHS  Insomnia- getting the RLS under control will help with this  Neuropathy and leg pain- continue home neurontin  Drug and ETOH abuse history  medicaiton non-compliance  Anxiety/depression- con't home vistaril  TD- continue Ingrezza    She presented with a hypertensive emergency and had right left leg weakness which has not recovered fully yet and MRI is negative for stroke.  Patient however has some giveaway weakness in multifocal complaints and has underlying restless leg syndrome which could be contributing.  Recommend continue Requip 4 mg for restless leg.  Continue gabapentin and the current dose as well.  Aspirin for stroke prevention.  Follow-up as outpatient with stroke clinic in 6 weeks.  Stroke team will sign off.  Kindly call for questions.  Discussed with Dr. Andi Hence, MD Medical Director [place] Pager: [phone] 05/10/2019 5:25 PM  To contact Stroke Continuity provider, please refer to [email or web address] After hours, contact General Neurology

## 2019-05-10 NOTE — Evaluation (Signed)
Speech Language Pathology Evaluation Patient Details Name: Sherry [name] MRN: [id number] DOB: 1956-02-20 Today's Date: 05/10/2019 Time: 3664-4034 SLP Time Calculation (min) (ACUTE ONLY): 18 min  Problem List:  Patient Active Problem List   Diagnosis Date Noted  . Chronic diastolic CHF (congestive heart failure) ([place]) 05/09/2019  . GERD (gastroesophageal reflux disease) 05/09/2019  . Alcohol abuse 05/09/2019  . Hypertension 05/08/2019  . Bilateral low back pain with bilateral sciatica 01/05/2019  . Cyst of ovary   . Pre-operative cardiovascular examination 06/13/2018  . Dizziness 02/24/2018  . Left-sided weakness 02/24/2018  . Cigarette smoker 12/31/2017  . DOE (dyspnea on exertion) 12/30/2017  . Phlebitis after infusion 10/28/2017  . Hypertensive emergency 10/21/2017  . Acute diastolic CHF (congestive heart failure) ([place]) 10/21/2017  . Microcytic anemia 10/21/2017  . Medication intolerance 09/06/2017  . Neuroleptic-induced tardive dyskinesia 09/06/2017  . Cancer ([place]) 09/06/2017  . Chronic low back pain 09/06/2017  . Grief reaction with prolonged bereavement 09/06/2017  . Opioid use disorder, severe, dependence ([place]) 08/27/2017  . Alcohol use disorder, severe, dependence ([place]) 08/27/2017  . Substance induced mood disorder ([place]) 08/13/2017  . AKI (acute kidney injury) ([place])   . MDD (major depressive disorder), recurrent severe, without psychosis ([place])   . Alcohol withdrawal ([place]) 08/09/2017  . Essential hypertension 07/06/2017  . Chronic kidney disease, stage III (moderate) 11/17/2016  . Depression with anxiety 08/21/2016  . Hx of bipolar disorder 01/31/2016  . History of breast cancer 02/13/2013  . Morbid obesity due to excess calories ([place]) 02/13/2013  . Hepatitis C virus infection cured after antiviral drug therapy 12/17/2012   Past Medical History:  Past Medical History:  Diagnosis Date  . Anxiety   . Bipolar and related disorder ([place])   . Blood transfusion without  reported diagnosis   . Breast cancer ([place])    right lumpectemy and lymph node removal  . CHF (congestive heart failure) ([place])   . Chronic back pain   . Chronic kidney disease    "Stage IV" - states due to hypertension  . Depression   . GERD (gastroesophageal reflux disease)   . Hepatitis C   . Hypertension   . Left-sided weakness   . MRSA infection    of breast incision  . Opioid dependence ([place])    Has been treated in [facility] in past  . Substance abuse [facility])    Past Surgical History:  Past Surgical History:  Procedure Laterality Date  . BACK SURGERY  1990  . BREAST SURGERY Right 2010   "breast cancer survivor" - states partial mastectomy and nodes  . COLONOSCOPY     [facility]  . ESOPHAGOGASTRODUODENOSCOPY     [facility]  . LAPAROSCOPIC CHOLECYSTECTOMY  2002  . LAPAROSCOPIC INCISIONAL / UMBILICAL / VENTRAL HERNIA REPAIR  04/13/2018   with BARD 15x 74QV mesh (supraumbilical)  . LAPAROSCOPIC LYSIS OF ADHESIONS  07/12/2018   Procedure: LAPAROSCOPIC LYSIS OF ADHESIONS;  Surgeon: Isabel Caprice, MD;  Location: [facility] ORS;  Service: Gynecology;;  . MYOMECTOMY     x 2 prior to hysterectomy  . ROBOTIC ASSISTED BILATERAL SALPINGO OOPHERECTOMY Right 07/12/2018   Procedure: XI ROBOTIC ASSISTED RIGHT SALPINGO OOPHORECTOMY;  Surgeon: Isabel Caprice, MD;  Location: [facility];  Service: Gynecology;  Laterality: Right;  . TOTAL ABDOMINAL HYSTERECTOMY     fibroids   . UPPER GASTROINTESTINAL ENDOSCOPY     HPI:  Pt is a 63 yr old female with medical history significant of hypertension, GERD, depression, anxiety,  substance abuse, alcohol abuse in remission, tobacco abuse, HCV, sCHF, breast cancer (right lumpectomy), CKD-III3b, iron deficiency anemia, medication noncompliance, who presents with elevated blood pressure and a left-sided weakness and numbness. Pt reports that she has intermitent left sided weakness and numbness for about 1 weeks which she attributed to  clonidine use. MRI came back negative for acute CVA.    Assessment / Plan / Recommendation Clinical Impression  Pt presents with functional cognitive linguistic abilities as evidenced by ability to recall orientation information x 4, demonstrate selective attention in moderately distracting environment, produce intelligible speech and demonstrate appropriate langauge organization. Pt reports difficulty formulating thoughts and producing the "right words" when she is talking. However, pt doesn't present with these deficits. Her speech is fluent, descriptive and cohesive. She is extremely effective communicator. Despite this education, pt states that severe headache prevents her from fully expressing herself. At this time, pt's perceived deficits don't negatively impact her level of independence or safe return to community. ST to sign off.     SLP Assessment  SLP Recommendation/Assessment: Patient does not need any further Speech Lanaguage Pathology Services SLP Visit Diagnosis: Cognitive communication deficit (R41.841)    Follow Up Recommendations  None    Frequency and Duration   N/A        SLP Evaluation Cognition  Overall Cognitive Status: Within Functional Limits for tasks assessed Arousal/Alertness: Awake/alert Orientation Level: Oriented X4 Attention: Selective Selective Attention: Appears intact Memory: Appears intact Awareness: Appears intact Problem Solving: Appears intact Safety/Judgment: Appears intact       Comprehension  Auditory Comprehension Overall Auditory Comprehension: Appears within functional limits for tasks assessed Visual Recognition/Discrimination Discrimination: Within Function Limits Reading Comprehension Reading Status: Not tested    Expression Expression Primary Mode of Expression: Verbal Verbal Expression Overall Verbal Expression: Appears within functional limits for tasks assessed Repetition: No impairment Naming: No impairment Pragmatics: No  impairment Non-Verbal Means of Communication: Not applicable Written Expression Dominant Hand: Right Written Expression: Not tested   Oral / Motor  Oral Motor/Sensory Function Overall Oral Motor/Sensory Function: Within functional limits Motor Speech Overall Motor Speech: Appears within functional limits for tasks assessed Respiration: Within functional limits Phonation: Normal Resonance: Within functional limits Articulation: Within functional limitis Intelligibility: Intelligible Motor Planning: Witnin functional limits Motor Speech Errors: Not applicable   GO                    Diona Peregoy 05/10/2019, 4:45 PM

## 2019-05-10 NOTE — Progress Notes (Signed)
Physical Therapy Treatment Patient Details Name: Sherry [name] MRN: [id number] DOB: 13-Jul-1956 Today's Date: 05/10/2019    History of Present Illness Pt is a 63 yr old female with medical history significant of hypertension, GERD, depression, anxiety, substance abuse, alcohol abuse in remission, tobacco abuse, HCV, sCHF, breast cancer (right lumpectomy), CKD-III3b, iron deficiency anemia, medication noncompliance, who presents with elevated blood pressure and a left-sided weakness and numbness. Pt reports that she has intermitent left sided weakness and numbness for about 1 weeks which she attributed to clonidine use. RN informed OT that patient's MRI came back negative for acute CVA.     PT Comments    Pt agreeable to participate with therapy. She rose very quickly from EOB into standing without RW present. Educated pt on the importance of using RW while she is experiencing weakness.  Pt continued to ambulate into the restroom w/o support from RW, but did use it for remainder of session. After ambulating into hallway, pt c/o dizziness and L LE began buckling, requiring additional support from PTA and return to room. Will continue to follow for mobility progression and safety.    Follow Up Recommendations  Home health PT;Supervision for mobility/OOB     Equipment Recommendations  Other (comment)(Rollator with seat and 4 wheels)    Recommendations for Other Services       Precautions / Restrictions Precautions Precautions: Fall Precaution Comments: try rollator next visit Restrictions Weight Bearing Restrictions: No    Mobility  Bed Mobility Overal bed mobility: Needs Assistance Bed Mobility: Supine to Sit     Supine to sit: Supervision     General bed mobility comments: supervision for safety  Transfers Overall transfer level: Needs assistance Equipment used: Rolling walker (2 wheeled) Transfers: Sit to/from Stand Sit to Stand: Supervision         General transfer  comment: pt standing initially without use of RW and leaving it behind before sitting. Frequent cues throughout session to keep RW with pt.  Ambulation/Gait Ambulation/Gait assistance: Min guard;Min assist Gait Distance (Feet): 30 Feet Assistive device: Rolling walker (2 wheeled) Gait Pattern/deviations: Step-through pattern;Decreased stride length;Decreased stance time - left;Narrow base of support     General Gait Details: Pt able to ambulate into hall before stating she was dizzy and needed to turn around. At the same time pt reported dizziness the LLE began buckling, requiring increased support from PTA.   Stairs             Wheelchair Mobility    Modified Rankin (Stroke Patients Only)       Balance Overall balance assessment: Needs assistance Sitting-balance support: Feet supported Sitting balance-Leahy Scale: Good     Standing balance support: Bilateral upper extremity supported;During functional activity Standing balance-Leahy Scale: Fair                              Cognition Arousal/Alertness: Awake/alert Behavior During Therapy: WFL for tasks assessed/performed Overall Cognitive Status: Within Functional Limits for tasks assessed                                        Exercises      General Comments General comments (skin integrity, edema, etc.): pt was able to don sock on the R but requested assist for the LLE.      Pertinent Vitals/Pain Pain Assessment: No/denies pain  Home Living                      Prior Function            PT Goals (current goals can now be found in the care plan section) Acute Rehab PT Goals Patient Stated Goal: to get home safely PT Goal Formulation: With patient Time For Goal Achievement: 05/23/19 Potential to Achieve Goals: Good Progress towards PT goals: Progressing toward goals    Frequency    Min 3X/week      PT Plan Current plan remains appropriate     Co-evaluation              AM-PAC PT "6 Clicks" Mobility   Outcome Measure  Help needed turning from your back to your side while in a flat bed without using bedrails?: A Little Help needed moving from lying on your back to sitting on the side of a flat bed without using bedrails?: A Little Help needed moving to and from a bed to a chair (including a wheelchair)?: A Little Help needed standing up from a chair using your arms (e.g., wheelchair or bedside chair)?: A Little Help needed to walk in hospital room?: A Little Help needed climbing 3-5 steps with a railing? : A Little 6 Click Score: 18    End of Session   Activity Tolerance: Patient tolerated treatment well;Other (comment)(limited by dizziness) Patient left: with call bell/phone within reach;in chair Nurse Communication: Mobility status PT Visit Diagnosis: Unsteadiness on feet (R26.81);Muscle weakness (generalized) (M62.81)     Time: 7616-0737 PT Time Calculation (min) (ACUTE ONLY): 20 min  Charges:  $Gait Training: 8-22 mins                    Benjiman Core, [place] Pager [phone] Acute Rehab   Allena Katz 05/10/2019, 1:11 PM

## 2019-05-10 NOTE — Consult Note (Signed)
[facility] Renal Consultation Note  Requesting MD: TRH Indication for Consultation: HTN  HPI:  Sherry [name] is a 63 y.o. female with history of HTN, anxiety, depression, substance and EtOH abuse, HCV, CKD stage IV, breast cancer, HFrEF who presented with left-sided weakness, numbness and headache in the setting of severely elevated blood pressure of 232/109 upon arrival to ED.   Code stroke initially called. CT head and MRI were negative. Initial labs revealed normal trop, CBC with hgb 11.6, CMP revealed normal electrolytes and creatinine of 2.1 which is consistent with her baseline, BNP normal.   She initially received IV labetalol with improvement.  Patient's home regimen includes amlodipine, hydralazine, terazosin, clonidine, and imdur which were resumed. However, she had not been taking clonidine recently due to headache. Coreg was initiated inpatient in place of Clonidine.   On evaluation this morning, Sherry [name] complained of a throbbing frontotemporal headache that she has had since admission. Endorses associated nausea. No vomiting, photo or phonophobia. No chest pain, shortness of breath, numbness/tingling, lower extremity swelling. She denies any history of migraine headaches and states these normally happen when her blood pressure is elevated. She endorses a longstanding history of difficult to control blood pressure and has been on 3 or more medications for several years. She has a strong family history of HTN. No family members have had renal stenting or gone on dialysis.    Creatinine, Ser  Date/Time Value Ref Range Status  05/10/2019 05:09 AM 2.14 (H) 0.44 - 1.00 mg/dL Final  05/08/2019 05:11 PM 2.13 (H) 0.44 - 1.00 mg/dL Final  04/25/2019 02:55 PM 2.15 (H) 0.44 - 1.00 mg/dL Final  11/15/2018 04:02 PM 2.16 (H) 0.57 - 1.00 mg/dL Final  06/27/2018 02:09 PM 2.12 (H) 0.44 - 1.00 mg/dL Final  05/13/2018 03:58 PM 2.02 (H) 0.57 - 1.00 mg/dL Final  12/01/2017 01:47 PM  1.86 (H) 0.57 - 1.00 mg/dL Final  11/08/2017 02:30 PM 2.05 (H) 0.57 - 1.00 mg/dL Final  10/24/2017 03:20 AM 2.70 (H) 0.44 - 1.00 mg/dL Final  10/23/2017 03:48 AM 2.71 (H) 0.44 - 1.00 mg/dL Final  10/22/2017 03:34 AM 2.08 (H) 0.44 - 1.00 mg/dL Final  10/21/2017 01:22 PM 1.65 (H) 0.44 - 1.00 mg/dL Final  09/13/2017 03:30 PM 2.01 (H) 0.57 - 1.00 mg/dL Final  08/24/2017 06:42 PM 1.88 (H) 0.44 - 1.00 mg/dL Final  08/12/2017 06:07 AM 1.44 (H) 0.44 - 1.00 mg/dL Final  08/10/2017 06:05 AM 2.08 (H) 0.44 - 1.00 mg/dL Final  08/09/2017 11:32 AM 2.96 (H) 0.44 - 1.00 mg/dL Final  10/03/2014 11:35 AM 1.39 (H) 0.50 - 1.10 mg/dL Final     PMHx:   Past Medical History:  Diagnosis Date  . Anxiety   . Bipolar and related disorder ([place])   . Blood transfusion without reported diagnosis   . Breast cancer ([place])    right lumpectemy and lymph node removal  . CHF (congestive heart failure) ([place])   . Chronic back pain   . Chronic kidney disease    "Stage IV" - states due to hypertension  . Depression   . GERD (gastroesophageal reflux disease)   . Hepatitis C   . Hypertension   . Left-sided weakness   . MRSA infection    of breast incision  . Opioid dependence ([place])    Has been treated in [facility] in past  . Substance abuse [facility])     Past Surgical History:  Procedure Laterality Date  . BACK SURGERY  1990  .  BREAST SURGERY Right 2010   "breast cancer survivor" - states partial mastectomy and nodes  . COLONOSCOPY     [facility]  . ESOPHAGOGASTRODUODENOSCOPY     [facility]  . LAPAROSCOPIC CHOLECYSTECTOMY  2002  . LAPAROSCOPIC INCISIONAL / UMBILICAL / VENTRAL HERNIA REPAIR  04/13/2018   with BARD 15x 09BD mesh (supraumbilical)  . LAPAROSCOPIC LYSIS OF ADHESIONS  07/12/2018   Procedure: LAPAROSCOPIC LYSIS OF ADHESIONS;  Surgeon: Isabel Caprice, MD;  Location: [facility];  Service: Gynecology;;  . MYOMECTOMY     x 2 prior to hysterectomy  . ROBOTIC ASSISTED BILATERAL  SALPINGO OOPHERECTOMY Right 07/12/2018   Procedure: XI ROBOTIC ASSISTED RIGHT SALPINGO OOPHORECTOMY;  Surgeon: Isabel Caprice, MD;  Location: [facility];  Service: Gynecology;  Laterality: Right;  . TOTAL ABDOMINAL HYSTERECTOMY     fibroids   . UPPER GASTROINTESTINAL ENDOSCOPY      Family Hx:  Family History  Problem Relation Age of Onset  . Heart attack Mother   . Breast cancer Mother 48  . Dementia Mother   . Cancer Father 61       died of bleeding from kidneys  . Colon cancer Neg Hx   . Esophageal cancer Neg Hx   . Stomach cancer Neg Hx   . Rectal cancer Neg Hx     Social History:  reports that she has been smoking cigarettes. She has been smoking about 0.25 packs per day. She has never used smokeless tobacco. She reports previous drug use. She reports that she does not drink alcohol.  Allergies:  Allergies  Allergen Reactions  . Abilify [Aripiprazole] Other (See Comments)    Tardive dyskinesia Oral  . Remeron [Mirtazapine] Other (See Comments)    Wgt stimulation /gain, Dizziness, Patient says "can tolerate"  . Trazodone And Nefazodone Other (See Comments)    Nightmares/sleep diturbance  . Flexeril [Cyclobenzaprine] Other (See Comments)    Pt states Flexeril makes her feel depressed   . Amoxicillin Diarrhea and Other (See Comments)    NOTE the patient has had PCN WITHOUT reaction Has patient had a PCN reaction causing immediate rash, facial/tongue/throat swelling, SOB or lightheadedness with hypotension: No Has patient had a PCN reaction causing severe rash involving mucus membranes or skin necrosis: No Has patient had a PCN reaction that required hospitalization: No Has patient had a PCN reaction occurring within the last 10 years: No If all of the above answers are "NO", then may proceed with Cephalosporin use.     Medications: Prior to Admission medications   Medication Sig Start Date End Date Taking? Authorizing Provider  amLODipine (NORVASC) 10 MG tablet TAKE 1  TABLET (10 MG TOTAL) BY MOUTH DAILY. 05/08/19  Yes Sherry Moron, MD  ferrous sulfate 325 (65 FE) MG tablet Take 1 tablet (325 mg total) by mouth daily with breakfast. 10/27/18  Yes Stallings, Zoe A, MD  FLUoxetine (PROZAC) 40 MG capsule Take 40 mg by mouth daily.  11/09/18  Yes [provider]  gabapentin (NEURONTIN) 600 MG tablet Take 1 tablet (600 mg total) by mouth 3 (three) times daily. 04/24/19  Yes Delia Chimes A, MD  hydrALAZINE (APRESOLINE) 25 MG tablet Take 25 mg by mouth 3 (three) times daily. 05/08/19  Yes [provider]  hydrOXYzine (VISTARIL) 25 MG capsule Take 25 mg by mouth 4 (four) times daily as needed for anxiety. 12/26/18  Yes [provider]  INGREZZA 80 MG CAPS Take 80 mg by mouth daily. 10/15/17  Yes [provider]  isosorbide mononitrate (IMDUR) 60 MG 24 hr tablet TAKE 1 TABLET (60 MG TOTAL) BY MOUTH DAILY. 12/27/18  Yes Stallings, Zoe A, MD  labetalol (NORMODYNE) 100 MG tablet TAKE 1 TABLET BY MOUTH TWICE A DAY 05/09/19  Yes Stallings, Zoe A, MD  Multiple Vitamins-Minerals (ADULT GUMMY) CHEW Chew 1 tablet by mouth daily. 07/21/17  Yes [provider]  ondansetron (ZOFRAN) 4 MG tablet TAKE 1 TABLET BY MOUTH EVERY 8 HOURS UP TO 7 DAYS AS NEEDED FOR NAUSEA & VOMITING Patient taking differently: Take 4 mg by mouth every 8 (eight) hours as needed for nausea or vomiting.  02/10/19  Yes Stallings, Zoe A, MD  pantoprazole (PROTONIX) 20 MG tablet Take 20 mg by mouth 2 (two) times daily. 05/08/19  Yes [provider]  terazosin (HYTRIN) 2 MG capsule Take 1 capsule (2 mg total) by mouth at bedtime. 10/27/18  Yes Sherry Moron, MD  varenicline (CHANTIX CONTINUING MONTH PAK) 1 MG tablet Take 1 tablet (1 mg total) by mouth 2 (two) times daily. 01/02/19  Yes Stallings, Zoe A, MD  cloNIDine (CATAPRES) 0.1 MG tablet TAKE 1 TABLET (0.1 MG TOTAL) BY MOUTH 2 (TWO) TIMES DAILY. Patient not taking: Reported on 05/09/2019 05/08/19   Sherry Moron, MD  lidocaine (LIDODERM) 5 % Place 1 patch onto the skin daily. Remove & Discard patch within 12 hours or as directed by MD Patient not taking: Reported on 05/09/2019 04/25/19   Eustaquio Maize, PA-C  nicotine (NICODERM CQ - DOSED IN MG/24 HOURS) 21 mg/24hr patch Place 1 patch (21 mg total) onto the skin daily. 12/28/18   Sherry Moron, MD  oxybutynin (DITROPAN-XL) 5 MG 24 hr tablet TAKE 1 TABLET BY MOUTH AT BEDTIME Patient not taking: Reported on 05/09/2019 12/27/18   Sherry Moron, MD  pantoprazole (PROTONIX) 40 MG tablet Take 1 tablet (40 mg total) by mouth 2 (two) times daily. Patient not taking: Reported on 05/09/2019 01/25/19 03/23/19  Lavena Bullion, DO    I have reviewed the patient's current medications.  Labs:  Results for orders placed or performed during the hospital encounter of 05/08/19 (from the past 48 hour(s))  Troponin I (High Sensitivity)     Status: None   Collection Time: 05/08/19  4:48 PM  Result Value Ref Range   Troponin I (High Sensitivity) 6 <18 ng/L    Comment: (NOTE) Elevated high sensitivity troponin I (hsTnI) values and significant  changes across serial measurements may suggest ACS but many other  chronic and acute conditions are known to elevate hsTnI results.  Refer to the "Links" section for chest pain algorithms and additional  guidance. Performed at [facility], [address] [place], [place] [address]   CBC with Differential     Status: Abnormal   Collection Time: 05/08/19  5:11 PM  Result Value Ref Range   WBC 6.7 4.0 - 10.5 K/uL   RBC 4.15 3.87 - 5.11 MIL/uL   Hemoglobin 11.6 (L) 12.0 - 15.0 g/dL   HCT 38.0 36.0 - 46.0 %   MCV 91.6 80.0 - 100.0 fL   MCH 28.0 26.0 - 34.0 pg   MCHC 30.5 30.0 - 36.0 g/dL   RDW 17.1 (H) 11.5 - 15.5 %   Platelets 322 150 - 400 K/uL   nRBC 0.0 0.0 - 0.2 %   Neutrophils Relative % 64 %   Neutro Abs 4.2 1.7 - 7.7 K/uL   Lymphocytes Relative 27 %  Lymphs Abs 1.8 0.7 - 4.0 K/uL    Monocytes Relative 6 %   Monocytes Absolute 0.4 0.1 - 1.0 K/uL   Eosinophils Relative 3 %   Eosinophils Absolute 0.2 0.0 - 0.5 K/uL   Basophils Relative 0 %   Basophils Absolute 0.0 0.0 - 0.1 K/uL   Immature Granulocytes 0 %   Abs Immature Granulocytes 0.02 0.00 - 0.07 K/uL    Comment: Performed at [facility], [address] [place], [place] [address]  Comprehensive metabolic panel     Status: Abnormal   Collection Time: 05/08/19  5:11 PM  Result Value Ref Range   Sodium 141 135 - 145 mmol/L   Potassium 5.1 3.5 - 5.1 mmol/L   Chloride 108 98 - 111 mmol/L   CO2 23 22 - 32 mmol/L   Glucose, Bld 102 (H) 70 - 99 mg/dL   BUN 28 (H) 8 - 23 mg/dL   Creatinine, Ser 2.13 (H) 0.44 - 1.00 mg/dL   Calcium 9.3 8.9 - 10.3 mg/dL   Total Protein 7.9 6.5 - 8.1 g/dL   Albumin 4.2 3.5 - 5.0 g/dL   AST 13 (L) 15 - 41 U/L   ALT 11 0 - 44 U/L   Alkaline Phosphatase 109 38 - 126 U/L   Total Bilirubin 0.3 0.3 - 1.2 mg/dL   GFR calc non Af Amer 24 (L) >60 mL/min   GFR calc Af Amer 28 (L) >60 mL/min   Anion gap 10 5 - 15    Comment: Performed at [facility], [address] [place], [place] [address]  Troponin I (High Sensitivity)     Status: None   Collection Time: 05/08/19  6:39 PM  Result Value Ref Range   Troponin I (High Sensitivity) 6 <18 ng/L    Comment: (NOTE) Elevated high sensitivity troponin I (hsTnI) values and significant  changes across serial measurements may suggest ACS but many other  chronic and acute conditions are known to elevate hsTnI results.  Refer to the "Links" section for chest pain algorithms and additional  guidance. Performed at [facility], [address] [place], [place] [address]   SARS CORONAVIRUS 2 (TAT 6-24 HRS) Nasopharyngeal Nasopharyngeal Swab     Status: None   Collection Time: 05/08/19  7:52 PM   Specimen: Nasopharyngeal Swab  Result Value Ref Range   SARS Coronavirus 2 NEGATIVE NEGATIVE    Comment:  (NOTE) SARS-CoV-2 target nucleic acids are NOT DETECTED. The SARS-CoV-2 RNA is generally detectable in upper and lower respiratory specimens during the acute phase of infection. Negative results do not preclude SARS-CoV-2 infection, do not rule out co-infections with other pathogens, and should not be used as the sole basis for treatment or other patient management decisions. Negative results must be combined with clinical observations, patient history, and epidemiological information. The expected result is Negative. Fact Sheet for Patients: SugarRoll.be Fact Sheet for Healthcare Providers: https://www.woods-mathews.com/ This test is not yet approved or cleared by the [place] FDA and  has been authorized for detection and/or diagnosis of SARS-CoV-2 by FDA under an Emergency Use Authorization (EUA). This EUA will remain  in effect (meaning this test can be used) for the duration of the COVID-19 declaration under Section 56 4(b)(1) of the Act, 21 U.S.C. section 360bbb-3(b)(1), unless the authorization is terminated or revoked sooner. Performed at [facility], [place] [address] [place], [place] [address]   HIV Antibody (routine testing w rflx)  Status: None   Collection Time: 05/09/19  5:47 AM  Result Value Ref Range   HIV Screen 4th Generation wRfx NON REACTIVE NON REACTIVE    Comment: Performed at [facility], [address] [place], [place] [address]  Hemoglobin A1c     Status: None   Collection Time: 05/09/19  5:47 AM  Result Value Ref Range   Hgb A1c MFr Bld 5.4 4.8 - 5.6 %    Comment: (NOTE) Pre diabetes:          5.7%-6.4% Diabetes:              >6.4% Glycemic control for   <7.0% adults with diabetes    Mean Plasma Glucose 108.28 mg/dL    Comment: Performed at [facility] [address], [place], [place] [address]  Lipid panel     Status: Abnormal   Collection Time: 05/09/19  5:47 AM  Result Value  Ref Range   Cholesterol 266 (H) 0 - 200 mg/dL   Triglycerides 117 <150 mg/dL   HDL 55 >40 mg/dL   Total CHOL/HDL Ratio 4.8 RATIO   VLDL 23 0 - 40 mg/dL   LDL Cholesterol 188 (H) 0 - 99 mg/dL    Comment:        Total Cholesterol/HDL:CHD Risk Coronary Heart Disease Risk Table                     Men   Women  1/2 Average Risk   3.4   3.3  Average Risk       5.0   4.4  2 X Average Risk   9.6   7.1  3 X Average Risk  23.4   11.0        Use the calculated Patient Ratio above and the CHD Risk Table to determine the patient's CHD Risk.        ATP III CLASSIFICATION (LDL):  <100     mg/dL   Optimal  100-129  mg/dL   Near or Above                    Optimal  130-159  mg/dL   Borderline  160-189  mg/dL   High  >190     mg/dL   Very High Performed at [facility] [address], [place], [place] [address]   Brain natriuretic peptide     Status: None   Collection Time: 05/09/19  5:47 AM  Result Value Ref Range   B Natriuretic Peptide 73.4 0.0 - 100.0 pg/mL    Comment: Performed at [facility] [address], [place], [place] [address]  Basic metabolic panel     Status: Abnormal   Collection Time: 05/10/19  5:09 AM  Result Value Ref Range   Sodium 137 135 - 145 mmol/L   Potassium 5.2 (H) 3.5 - 5.1 mmol/L   Chloride 105 98 - 111 mmol/L   CO2 20 (L) 22 - 32 mmol/L   Glucose, Bld 102 (H) 70 - 99 mg/dL   BUN 32 (H) 8 - 23 mg/dL   Creatinine, Ser 2.14 (H) 0.44 - 1.00 mg/dL   Calcium 9.2 8.9 - 10.3 mg/dL   GFR calc non Af Amer 24 (L) >60 mL/min   GFR calc Af Amer 28 (L) >60 mL/min   Anion gap 12 5 - 15    Comment: Performed at [facility] [address] [place], [place]  [address]  CBC     Status: Abnormal   Collection Time: 05/10/19  7:11 AM  Result Value Ref Range   WBC 5.2 4.0 - 10.5 K/uL   RBC 3.91 3.87 - 5.11 MIL/uL   Hemoglobin 10.9 (L) 12.0 - 15.0 g/dL   HCT 35.5 (L) 36.0 - 46.0 %   MCV 90.8 80.0 - 100.0 fL   MCH 27.9 26.0 - 34.0 pg   MCHC 30.7 30.0  - 36.0 g/dL   RDW 17.2 (H) 11.5 - 15.5 %   Platelets 265 150 - 400 K/uL   nRBC 0.0 0.0 - 0.2 %    Comment: Performed at [facility] [address], [place], [place] [address]     ROS:  Pertinent items noted in HPI and remainder of comprehensive ROS otherwise negative.  Physical Exam: Vitals:   05/10/19 0455 05/10/19 0843  BP: (!) 192/77 (!) 151/57  Pulse: 81 65  Resp: 18 18  Temp: 98.1 F (36.7 C) 98.8 F (37.1 C)  SpO2: 94% 100%     General: awake, alert, pleasant female lying in bed in NAD HEENT: [place]/AT. Moist mucous membranes Eyes: Conjunctiva normal; EOMI Neck: supple; no thyromegaly Heart: RRR Lungs: normal respiratory effort; lungs CTAB Abdomen: soft, non-tender, non-distended Extremities: no edema  Skin: warm and dry  Neuro: A&Ox3; cranial nerves intact; sensory intact throughout; gross motor intact apart from 4/5 in LLE which has been chronic due degenerative changes in lumbar spine based on recent MRI   Assessment/Plan: 1.HTN - Patient admitted for hypertensive emergency with headache and left sided numbness/weakness which have resolved with improvement in blood pressure. This may have been in the setting of rebound HTN due to recent non-compliance with clonidine.  Patient has longstanding history of resistant HTN despite aggressive medical therapy with 3 or more agents. It appears she has never been worked up for secondary causes. She had a normal TSH in 2019. No signs or symptoms suggestive of Cushing's. Will rule out renovascular, primary aldosteronism, and pheo (much less likely) as potential etiologies. If work-up is unrevealing, she likely has malignant primary HTN given strong family history. Could also consider OSA work-up outpatient.  Regarding anti-hypertensive regimen, will change Coreg to Labetalol and continue remainder of regimen.   2. CKD IV - creatinine has remained around baseline of 2.1 since admission. Will continue to monitor.   Delice Bison 05/10/2019, 9:14 AM

## 2019-05-11 ENCOUNTER — Encounter (HOSPITAL_COMMUNITY): Payer: Self-pay | Admitting: Family Medicine

## 2019-05-11 ENCOUNTER — Encounter (HOSPITAL_COMMUNITY): Payer: Medicare HMO

## 2019-05-11 DIAGNOSIS — F418 Other specified anxiety disorders: Secondary | ICD-10-CM

## 2019-05-11 DIAGNOSIS — F17210 Nicotine dependence, cigarettes, uncomplicated: Secondary | ICD-10-CM | POA: Diagnosis not present

## 2019-05-11 DIAGNOSIS — D509 Iron deficiency anemia, unspecified: Secondary | ICD-10-CM

## 2019-05-11 DIAGNOSIS — I5032 Chronic diastolic (congestive) heart failure: Secondary | ICD-10-CM | POA: Diagnosis not present

## 2019-05-11 DIAGNOSIS — G459 Transient cerebral ischemic attack, unspecified: Principal | ICD-10-CM

## 2019-05-11 DIAGNOSIS — N1832 Chronic kidney disease, stage 3b: Secondary | ICD-10-CM | POA: Diagnosis not present

## 2019-05-11 DIAGNOSIS — I10 Essential (primary) hypertension: Secondary | ICD-10-CM

## 2019-05-11 LAB — RENAL FUNCTION PANEL
Albumin: 3.6 g/dL (ref 3.5–5.0)
Anion gap: 13 (ref 5–15)
BUN: 37 mg/dL — ABNORMAL HIGH (ref 8–23)
CO2: 18 mmol/L — ABNORMAL LOW (ref 22–32)
Calcium: 8.9 mg/dL (ref 8.9–10.3)
Chloride: 105 mmol/L (ref 98–111)
Creatinine, Ser: 2.93 mg/dL — ABNORMAL HIGH (ref 0.44–1.00)
GFR calc Af Amer: 19 mL/min — ABNORMAL LOW (ref >60)
GFR calc non Af Amer: 16 mL/min — ABNORMAL LOW (ref >60)
Glucose, Bld: 136 mg/dL — ABNORMAL HIGH (ref 70–99)
Phosphorus: 4.8 mg/dL — ABNORMAL HIGH (ref 2.5–4.6)
Potassium: 4.2 mmol/L (ref 3.5–5.1)
Sodium: 136 mmol/L (ref 135–145)

## 2019-05-11 MED ORDER — HYDRALAZINE HCL 25 MG PO TABS: 25.0000 mg | ORAL_TABLET | Freq: Three times a day (TID) | ORAL | 0 refills | Status: DC

## 2019-05-11 MED ORDER — LABETALOL HCL 200 MG PO TABS: 200.0000 mg | ORAL_TABLET | Freq: Two times a day (BID) | ORAL | 0 refills | Status: DC

## 2019-05-11 MED ORDER — ASPIRIN 81 MG PO CHEW: 81.0000 mg | CHEWABLE_TABLET | Freq: Every day | ORAL | 0 refills | Status: DC

## 2019-05-11 MED ORDER — ATORVASTATIN CALCIUM 40 MG PO TABS: 40.0000 mg | ORAL_TABLET | Freq: Every day | ORAL | 0 refills | Status: DC

## 2019-05-11 MED ORDER — GABAPENTIN 600 MG PO TABS: 600.0000 mg | ORAL_TABLET | Freq: Two times a day (BID) | ORAL | 0 refills | Status: DC

## 2019-05-11 MED ORDER — FUROSEMIDE 20 MG PO TABS: 20.0000 mg | ORAL_TABLET | Freq: Every day | ORAL | 0 refills | Status: DC

## 2019-05-11 MED FILL — hydrALAZINE HCL 25 MG TABS: 25 | 30 days supply | Qty: 90 | Fill #0

## 2019-05-11 MED FILL — ASPIRIN LOW DOSE 81 MG CHEW: 81 | 30 days supply | Qty: 30 | Fill #0

## 2019-05-11 MED FILL — FUROSEMIDE 20 MG TAB: 20 | 30 days supply | Qty: 30 | Fill #0

## 2019-05-11 MED FILL — ATORVASTATIN CALCIUM 40 MG: 40 | 30 days supply | Qty: 30 | Fill #0

## 2019-05-11 MED FILL — LABETALOL HCL 200 MG TABLET: 200 | 30 days supply | Qty: 60 | Fill #0

## 2019-05-11 NOTE — Progress Notes (Signed)
Sherry [name] to be discharged Home per MD order. Discussed prescriptions and follow up appointments with the patient. Medication given to the patient by transitions care. Patient verbalized understanding.  Skin clean, dry and intact without evidence of skin break down, no evidence of skin tears noted. IV catheter discontinued intact. Site without signs and symptoms of complications. Dressing and pressure applied. Pt denies pain at the site currently. No complaints noted.  Patient free of lines, drains, and wounds.   An After Visit Summary (AVS) was printed and given to the patient. Patient escorted via wheelchair, and discharged home via private auto.  Amaryllis Dyke, RN

## 2019-05-11 NOTE — Discharge Summary (Signed)
Physician Discharge Summary  Sherry [name] OZD:[id number] DOB: 1956-06-17 DOA: 05/08/2019  PCP: Forrest Moron, MD  Admit date: 05/08/2019 Discharge date: 05/11/2019  Admitted From: Home  Disposition:  Home   Recommendations for Outpatient Follow-up:  1. Follow up with Dr. Nolon Rod in 1-2 weeks 2. Follow up with Dr. Carolin Sicks, Nephrology in 10 days 3. Follow up with [facility] Neurology in 4-6 weeks for TIA 4. Dr. Carolin Sicks: Please follow up on aldosterone, PRA, metanephrines in the hospital 5. Dr. Carolin Sicks: Please refer for renal artery duplex  6. Drs. Stallings: Please refer for outpatient sleep study 7. Dr. Nolon Rod: Please check LFTs/CK/lipids in 3 months on new atorvastatin     Home Health: PT/OT/RN  Equipment/Devices: None  Discharge Condition: Fair  CODE STATUS: FULL Diet recommendation: Cardiac  Brief/Interim Summary: Sherry [name] is a 63 y.o. F with dCHF, HTN, depression, BrCA s/p lumpectomy, and IDA who presented with high blood pressure from Cardiologist's office.  Also noted few days headache and left-sided weakness/numbness.  In the ER, BP 232/109 mmHg and given IV labetalol, amlodipine, HCTZ and hydralazine.  Troponin 6, CXR with congestion.     PRINCIPAL HOSPITAL DIAGNOSIS: Hypertensive emergency and TIA    Discharge Diagnoses:   Hypertensive emergency Presented with BP >230/105 mmHg and neurologic symptoms of left sided weakness, speech difficulties as well as headache and back pain.  CT in the ER, negative.  BP reduced ~20% in first 24 hours, oral agents subsequently restarted.    Nephrology were consulted for secondary causes of hypertension and recommended aldosterone, PRA and metanephrines as well as renal artery duplex.  Patient's regimen was adjusted and she had good BP control 259D-638V systolic for over 24 hours.    Current regimen: Amlodipine 10 daily Hydralazine 25 TID Imdur 60 daily offlabel Terazosin 2 mg nightly HCTZ replaced with  furosemide 20 given renal function Clonidine stopped due to intolerance, rebound hypertension --> replaced with labetalol 200 BID  See titration instructions below. Sleep study recommended.    Suspected right small vessel TIA from hypertension MRI brain negative for stroke, mass.   Neurology were consulted, suspected small vessel TIA from hypertension. -Echocardiogram showed no source of embolism, showed normal EF, valves -Carotid imaging <50% stenosis -Lipids ordered: LDL 188 >> started on Lipitor 40 daily  -A1c 5.4% -Aspirin 81 mg daily started -Atrial fibrillation: not present this admission  -tPA not given because outside window -PT recommend HHPT and rollator  CKD IIIb Baseline Cr 2-2.1, stable relative to baseline  Depression/anxiety  Neuropathy  Microcytic anemia  Smoking Cessation strongly recommended, modalities discussed  Chronic diastolic CHF Fluid status hard to tell with habitus, no dyspnea/orthopnea.  Troponin elevation Demand ischemia, doubt ACS  Headache This persisted through most of her hospitalization.  Neuraxial imaging negative.  Tylenol didn't help.  Oxycodone helped a little, morphine in the ER helped. Diphenhydramine+Phenergan did not help, nor did compazine, and triptans contraindicated in uncontrolled HTN.  By day of discharge, this was stable.         Discharge Instructions  Discharge Instructions    Ambulatory referral to Neurology   Complete by: As directed    An appointment is requested in approximately: 4 weeks   Diet - low sodium heart healthy   Complete by: As directed    Discharge instructions   Complete by: As directed    From Dr. Loleta Books: Call your primary care doctor Sherry [name] for a follow up in 1-2 weeks Have her check your lab work  You have  an appointment with your kidney doctor Dr. Carolin Sicks in 2 weeks.   This appointment is on Tuesday Nov 10 at 3:45PM.   If you have problems before this appointment,  his office number is listed below in the To Do section.   Ask him about your lab work in the hospital and also to follow up the ultrasound of your kidneys that the kidney doctor here recommended.    For your blood pressure, here is your new regimen: Amlodipine 10 mg once daily Imdur 60 mg once daily Labetalol 200 mg twice daily (this dose is increased!) Hydralazine 25 mg three times daily Furosemide/Lasix 20 mg once daily Terazosin 2 mg once nightly  STOP CLONIDINE STOP HCTZ (Lasix is replacing this)   Measure you blood pressure daily Your goal is <140 for the top number AND <90 for the bottom number  In four days (on Monday Nov 2) if you aren't having dizziness with standing on those medicines and your blood pressure is still high:  Increase the hydralazine to 50 mg (two tabs) three times a day  Four days after that (on Thursday Nov 6) if you still aren't having dizziness after increasing the hydralazine and your blood pressure is still high: Increase the terazosin to 4 mg (two tabs) nightly   Also while you were here, we think you may have had a ministroke or "TIA". None of your other testing (of the brain, the blood vessels of the neck, or your heart) showed anything concerning.   Since you should assume you had a ministroke, you should take: Aspirin 81 mg daily Atorvastatin 40 mg nightly  You should follow up with the neurologists in about 6 weeks. Their info is below in the TO Do section, but they should contact you for an appoitnment.  We have asked the home health agency to send out a nurse to review your new medicines and help you with them   Increase activity slowly   Complete by: As directed      Allergies as of 05/11/2019      Reactions   Abilify [aripiprazole] Other (See Comments)   Tardive dyskinesia Oral   Remeron [mirtazapine] Other (See Comments)   Wgt stimulation /gain, Dizziness, Patient says "can tolerate"   Trazodone And Nefazodone Other (See  Comments)   Nightmares/sleep diturbance   Flexeril [cyclobenzaprine] Other (See Comments)   Pt states Flexeril makes her feel depressed    Amoxicillin Diarrhea, Other (See Comments)   NOTE the patient has had PCN WITHOUT reaction Has patient had a PCN reaction causing immediate rash, facial/tongue/throat swelling, SOB or lightheadedness with hypotension: No Has patient had a PCN reaction causing severe rash involving mucus membranes or skin necrosis: No Has patient had a PCN reaction that required hospitalization: No Has patient had a PCN reaction occurring within the last 10 years: No If all of the above answers are "NO", then may proceed with Cephalosporin use.      Medication List    STOP taking these medications   cloNIDine 0.1 MG tablet Commonly known as: CATAPRES   lidocaine 5 % Commonly known as: Lidoderm   oxybutynin 5 MG 24 hr tablet Commonly known as: DITROPAN-XL     TAKE these medications   Adult Gummy Chew Chew 1 tablet by mouth daily.   amLODipine 10 MG tablet Commonly known as: NORVASC TAKE 1 TABLET (10 MG TOTAL) BY MOUTH DAILY.   aspirin 81 MG chewable tablet Chew 1 tablet (81 mg total) by mouth daily.  atorvastatin 40 MG tablet Commonly known as: LIPITOR Take 1 tablet (40 mg total) by mouth daily at 6 PM.   ferrous sulfate 325 (65 FE) MG tablet Take 1 tablet (325 mg total) by mouth daily with breakfast.   FLUoxetine 40 MG capsule Commonly known as: PROZAC Take 40 mg by mouth daily.   furosemide 20 MG tablet Commonly known as: LASIX Take 1 tablet (20 mg total) by mouth daily.   gabapentin 600 MG tablet Commonly known as: NEURONTIN Take 1 tablet (600 mg total) by mouth 2 (two) times daily. What changed: when to take this   hydrALAZINE 25 MG tablet Commonly known as: APRESOLINE Take 1 tablet (25 mg total) by mouth 3 (three) times daily.   hydrOXYzine 25 MG capsule Commonly known as: VISTARIL Take 25 mg by mouth 4 (four) times daily as  needed for anxiety.   Ingrezza 80 MG Caps Generic drug: Valbenazine Tosylate Take 80 mg by mouth daily.   isosorbide mononitrate 60 MG 24 hr tablet Commonly known as: IMDUR TAKE 1 TABLET (60 MG TOTAL) BY MOUTH DAILY.   labetalol 200 MG tablet Commonly known as: NORMODYNE Take 1 tablet (200 mg total) by mouth 2 (two) times daily.   nicotine 21 mg/24hr patch Commonly known as: NICODERM CQ - dosed in mg/24 hours Place 1 patch (21 mg total) onto the skin daily.   pantoprazole 20 MG tablet Commonly known as: PROTONIX Take 20 mg by mouth 2 (two) times daily. What changed: Another medication with the same name was removed. Continue taking this medication, and follow the directions you see here.   terazosin 2 MG capsule Commonly known as: HYTRIN Take 1 capsule (2 mg total) by mouth at bedtime.   varenicline 1 MG tablet Commonly known as: Chantix Continuing Month Pak Take 1 tablet (1 mg total) by mouth 2 (two) times daily.      Follow-up Information    [facility] Follow up.   Specialty: Home Health Services Why: They will be in contact with you in the next 1-2 days to set up your first home appointment.  Contact information: [place] [address] [phone]          Allergies  Allergen Reactions  . Abilify [Aripiprazole] Other (See Comments)    Tardive dyskinesia Oral  . Remeron [Mirtazapine] Other (See Comments)    Wgt stimulation /gain, Dizziness, Patient says "can tolerate"  . Trazodone And Nefazodone Other (See Comments)    Nightmares/sleep diturbance  . Flexeril [Cyclobenzaprine] Other (See Comments)    Pt states Flexeril makes her feel depressed   . Amoxicillin Diarrhea and Other (See Comments)    NOTE the patient has had PCN WITHOUT reaction Has patient had a PCN reaction causing immediate rash, facial/tongue/throat swelling, SOB or lightheadedness with hypotension: No Has patient had a PCN reaction causing severe rash  involving mucus membranes or skin necrosis: No Has patient had a PCN reaction that required hospitalization: No Has patient had a PCN reaction occurring within the last 10 years: No If all of the above answers are "NO", then may proceed with Cephalosporin use.     Consultations:  Neurology  Nephrology   Procedures/Studies: Dg Chest 2 View  Result Date: 05/08/2019 CLINICAL DATA:  Back pain and hypertension EXAM: CHEST - 2 VIEW COMPARISON:  04/25/2019 FINDINGS: Heart size is enlarged. There is atelectasis at the lung bases. The lung volumes are somewhat low. There is no pneumothorax. No large pleural effusion.  There is some mild vascular congestion. IMPRESSION: Cardiomegaly with mild vascular congestion. Electronically Signed   By: Constance Holster M.D.   On: 05/08/2019 18:15   Dg Chest 2 View  Result Date: 04/25/2019 CLINICAL DATA:  Chest pain EXAM: CHEST - 2 VIEW COMPARISON:  04/23/2018 FINDINGS: Heart is mildly enlarged. No confluent airspace opacities, effusions or edema. No acute bony abnormality. IMPRESSION: Mild cardiomegaly.  No active disease. Electronically Signed   By: Rolm Baptise M.D.   On: 04/25/2019 16:24   Dg Abd 1 View  Result Date: 05/10/2019 CLINICAL DATA:  Diffuse abdominal pain EXAM: ABDOMEN - 1 VIEW COMPARISON:  12/31/2018 FINDINGS: Prior cholecystectomy. Moderate stool burden throughout the colon. No bowel obstruction or free air. No organomegaly or suspicious calcification. IMPRESSION: Moderate stool burden.  No acute findings. Electronically Signed   By: Rolm Baptise M.D.   On: 05/10/2019 11:15   Ct Head Wo Contrast  Result Date: 05/08/2019 CLINICAL DATA:  Headache, LEFT-sided numbness intermittently, history uncontrolled hypertension, CHF, advanced renal insufficiency, breast cancer EXAM: CT HEAD WITHOUT CONTRAST TECHNIQUE: Contiguous axial images were obtained from the base of the skull through the vertex without intravenous contrast. Sagittal and coronal  MPR images reconstructed from axial data set. COMPARISON:  09/03/2018 FINDINGS: Brain: Normal ventricular morphology. No midline shift or mass effect. Small vessel chronic ischemic changes of deep cerebral white matter. No intracranial hemorrhage, mass lesion, or evidence of acute infarction. No extra-axial fluid collections. Vascular: No hyperdense vessels. Atherosclerotic calcification of internal carotid arteries at skull base. Skull: Intact Sinuses/Orbits: Clear Other: N/A IMPRESSION: Small vessel chronic ischemic changes of deep cerebral white matter. No acute intracranial abnormalities. Electronically Signed   By: Lavonia Dana M.D.   On: 05/08/2019 18:03   Mr Angio Head Wo Contrast  Result Date: 05/09/2019 CLINICAL DATA:  TIA, initial exam. Headache with left-sided numbness intermittently EXAM: MRI HEAD WITHOUT CONTRAST MRA HEAD WITHOUT CONTRAST TECHNIQUE: Multiplanar, multiecho pulse sequences of the brain and surrounding structures were obtained without intravenous contrast. Angiographic images of the head were obtained using MRA technique without contrast. COMPARISON:  04/06/2019 FINDINGS: MRI HEAD FINDINGS Brain: No acute infarction, hemorrhage, hydrocephalus, extra-axial collection or mass lesion. FLAIR hyperintensity in the periventricular white matter and to a mild extent in the pons, stable from comparison and likely from chronic small vessel ischemia. Age normal brain volume Vascular: Arterial findings below. Normal dural venous sinus flow voids Skull and upper cervical spine: Voids negative for marrow lesion. Partial visualization of an ACDF plate Sinuses/Orbits: Negative Other: Motion degraded MRA HEAD FINDINGS Vertebral, basilar, and carotid arteries are patent. There is mild irregularity and narrowing at the bilateral anterior genu of the ICA, likely from flow artifact and motion. Extensive medium size vessel irregularity including high-grade bilateral M2 segment stenoses and high-grade right  P1 segment stenosis. This is likely atherosclerosis accentuated by motion. No definite poststenotic beading to imply a vasculitis in this patient with headache. Fetal type left PCA. Negative for aneurysm or evident vascular malformation IMPRESSION: Brain MRI: 1. No acute finding or change from last month. 2. Mild presumed chronic small vessel ischemia. 3. Motion degraded. Intracranial MRA: Extensive irregularity of medium size vessels, likely advanced intracranial atherosclerosis. The narrowings are not associated with beading to strongly implicate a vasculitis/vasculopathy in this patient with history of headache. The most notable high-grade narrowings involve bilateral M2 branches in the right P1 segment. Electronically Signed   By: Monte Fantasia M.D.   On: 05/09/2019 09:14   Mr Brain Lottie Dawson  Contrast  Result Date: 05/09/2019 CLINICAL DATA:  TIA, initial exam. Headache with left-sided numbness intermittently EXAM: MRI HEAD WITHOUT CONTRAST MRA HEAD WITHOUT CONTRAST TECHNIQUE: Multiplanar, multiecho pulse sequences of the brain and surrounding structures were obtained without intravenous contrast. Angiographic images of the head were obtained using MRA technique without contrast. COMPARISON:  04/06/2019 FINDINGS: MRI HEAD FINDINGS Brain: No acute infarction, hemorrhage, hydrocephalus, extra-axial collection or mass lesion. FLAIR hyperintensity in the periventricular white matter and to a mild extent in the pons, stable from comparison and likely from chronic small vessel ischemia. Age normal brain volume Vascular: Arterial findings below. Normal dural venous sinus flow voids Skull and upper cervical spine: Voids negative for marrow lesion. Partial visualization of an ACDF plate Sinuses/Orbits: Negative Other: Motion degraded MRA HEAD FINDINGS Vertebral, basilar, and carotid arteries are patent. There is mild irregularity and narrowing at the bilateral anterior genu of the ICA, likely from flow artifact and  motion. Extensive medium size vessel irregularity including high-grade bilateral M2 segment stenoses and high-grade right P1 segment stenosis. This is likely atherosclerosis accentuated by motion. No definite poststenotic beading to imply a vasculitis in this patient with headache. Fetal type left PCA. Negative for aneurysm or evident vascular malformation IMPRESSION: Brain MRI: 1. No acute finding or change from last month. 2. Mild presumed chronic small vessel ischemia. 3. Motion degraded. Intracranial MRA: Extensive irregularity of medium size vessels, likely advanced intracranial atherosclerosis. The narrowings are not associated with beading to strongly implicate a vasculitis/vasculopathy in this patient with history of headache. The most notable high-grade narrowings involve bilateral M2 branches in the right P1 segment. Electronically Signed   By: Monte Fantasia M.D.   On: 05/09/2019 09:14   Vas US Carotid (at [place] Only)  Result Date: 05/09/2019 Carotid Arterial Duplex Study Indications:       TIA. Risk Factors:      Hypertension. Limitations        Today's exam was limited due to the body habitus of the                    patient, the high bifurcation of the carotid and the                    patient's respiratory variation. Comparison Study:  No prior study. Performing Technologist: Maudry Mayhew MHA, RDMS, RVT, RDCS  Examination Guidelines: A complete evaluation includes B-mode imaging, spectral Doppler, color Doppler, and power Doppler as needed of all accessible portions of each vessel. Bilateral testing is considered an integral part of a complete examination. Limited examinations for reoccurring indications may be performed as noted.  Right Carotid Findings: +----------+--------+--------+--------+-----------------------+--------+           PSV cm/sEDV cm/sStenosisPlaque Description     Comments +----------+--------+--------+--------+-----------------------+--------+ CCA Prox   112     15              smooth and homogeneous          +----------+--------+--------+--------+-----------------------+--------+ CCA Distal65      14              smooth and homogeneous          +----------+--------+--------+--------+-----------------------+--------+ ICA Prox  81      22              smooth and heterogenous         +----------+--------+--------+--------+-----------------------+--------+ ICA Distal65      27                                              +----------+--------+--------+--------+-----------------------+--------+  ECA       93      11                                              +----------+--------+--------+--------+-----------------------+--------+ +----------+--------+-------+----------------+-------------------+           PSV cm/sEDV cmsDescribe        Arm Pressure (mmHG) +----------+--------+-------+----------------+-------------------+ [id number]            Multiphasic, WNL                    +----------+--------+-------+----------------+-------------------+ +---------+--------+--+--------+--+---------+ VertebralPSV cm/s52EDV cm/s21Antegrade +---------+--------+--+--------+--+---------+  Left Carotid Findings: +----------+--------+--------+--------+------------------+--------+           PSV cm/sEDV cm/sStenosisPlaque DescriptionComments +----------+--------+--------+--------+------------------+--------+ CCA Prox  120     20                                         +----------+--------+--------+--------+------------------+--------+ CCA Distal98      26                                         +----------+--------+--------+--------+------------------+--------+ ICA Prox  117     32                                         +----------+--------+--------+--------+------------------+--------+ ICA Distal75      24                                          +----------+--------+--------+--------+------------------+--------+ ECA       161     21                                         +----------+--------+--------+--------+------------------+--------+ +----------+--------+--------+----------------+-------------------+           PSV cm/sEDV cm/sDescribe        Arm Pressure (mmHG) +----------+--------+--------+----------------+-------------------+ [id number]             Multiphasic, WNL                    +----------+--------+--------+----------------+-------------------+ +---------+--------+--+--------+--+---------+ VertebralPSV cm/s55EDV cm/s19Antegrade +---------+--------+--+--------+--+---------+  Summary: Right Carotid: Velocities in the right ICA are consistent with a 1-39% stenosis. Left Carotid: Velocities in the left ICA are consistent with a 1-39% stenosis. Vertebrals:  Bilateral vertebral arteries demonstrate antegrade flow. Subclavians: Normal flow hemodynamics were seen in bilateral subclavian              arteries. *See table(s) above for measurements and observations.  Electronically signed by Deitra Mayo MD on 05/09/2019 at 3:08:44 PM.    Final    Echo 10/27 IMPRESSIONS    1. Left ventricular ejection fraction, by visual estimation, is 65 to 70%. The left ventricle has hyperdynamic function. Normal left ventricular size. There is mildly increased left ventricular hypertrophy.  2. Elevated mean left atrial pressure.  3. Left ventricular diastolic Doppler parameters are consistent with pseudonormalization  pattern of LV diastolic filling.  4. Global right ventricle has normal systolic function.The right ventricular size is normal. No increase in right ventricular wall thickness.  5. Left atrial size was mildly dilated.  6. Right atrial size was normal.  7. The mitral valve is normal in structure. No evidence of mitral valve regurgitation.  8. The tricuspid valve is normal in structure. Tricuspid valve  regurgitation was not visualized by color flow Doppler.  9. The aortic valve is normal in structure. Aortic valve regurgitation was not visualized by color flow Doppler. 10. The pulmonic valve was not well visualized. Pulmonic valve regurgitation is trivial by color flow Doppler. 11. TR signal is inadequate for assessing pulmonary artery systolic pressure.     Subjective: Feeling still mild headache.  No vomiting.  No leg weakness or arm weakness.  No paresthesias.  Has some moderate back pain, chronic, radiating to left leg.  No chest pain, dyspnea.  Discharge Exam: Vitals:   05/10/19 2048 05/11/19 0939  BP: (!) 143/58 (!) 131/44  Pulse: 70 70  Resp: 16 18  Temp: 98.9 F (37.2 C) 99.1 F (37.3 C)  SpO2: 98% 97%   Vitals:   05/10/19 0843 05/10/19 1700 05/10/19 2048 05/11/19 0939  BP: (!) 151/57 (!) 135/48 (!) 143/58 (!) 131/44  Pulse: 65 66 70 70  Resp: 18 18 16 18   Temp: 98.8 F (37.1 C) 98.4 F (36.9 C) 98.9 F (37.2 C) 99.1 F (37.3 C)  TempSrc: Oral Oral Oral Oral  SpO2: 100% 100% 98% 97%  Weight:      Height:        General: Pt is alert, awake, not in acute distress Cardiovascular: RRR, nl S1-S2, no murmurs appreciated.   No LE edema.   Respiratory: Normal respiratory rate and rhythm.  CTAB without rales or wheezes. Abdominal: Abdomen soft and non-tender.  No distension or HSM.   MSK: Positive straight leg raise test on left.   Neuro/Psych: Strength symmetric in upper and lower extremities.  Judgment and insight appear normal.   The results of significant diagnostics from this hospitalization (including imaging, microbiology, ancillary and laboratory) are listed below for reference.     Microbiology: Recent Results (from the past 240 hour(s))  SARS CORONAVIRUS 2 (TAT 6-24 HRS) Nasopharyngeal Nasopharyngeal Swab     Status: None   Collection Time: 05/08/19  7:52 PM   Specimen: Nasopharyngeal Swab  Result Value Ref Range Status   SARS Coronavirus 2 NEGATIVE  NEGATIVE Final    Comment: (NOTE) SARS-CoV-2 target nucleic acids are NOT DETECTED. The SARS-CoV-2 RNA is generally detectable in upper and lower respiratory specimens during the acute phase of infection. Negative results do not preclude SARS-CoV-2 infection, do not rule out co-infections with other pathogens, and should not be used as the sole basis for treatment or other patient management decisions. Negative results must be combined with clinical observations, patient history, and epidemiological information. The expected result is Negative. Fact Sheet for Patients: SugarRoll.be Fact Sheet for Healthcare Providers: https://www.woods-mathews.com/ This test is not yet approved or cleared by the [place] FDA and  has been authorized for detection and/or diagnosis of SARS-CoV-2 by FDA under an Emergency Use Authorization (EUA). This EUA will remain  in effect (meaning this test can be used) for the duration of the COVID-19 declaration under Section 56 4(b)(1) of the Act, 21 U.S.C. section 360bbb-3(b)(1), unless the authorization is terminated or revoked sooner. Performed at [facility], [place] [address] [place], [place]  [id number]      Labs: BNP (last 3 results) Recent Labs    05/09/19 0547  BNP 07.6   Basic Metabolic Panel: Recent Labs  Lab 05/08/19 1711 05/10/19 0509 05/11/19 0801  NA 141 137 136  K 5.1 5.2* 4.2  CL 108 105 105  CO2 23 20* 18*  GLUCOSE 102* 102* 136*  BUN 28* 32* 37*  CREATININE 2.13* 2.14* 2.93*  CALCIUM 9.3 9.2 8.9  PHOS  --   --  4.8*   Liver Function Tests: Recent Labs  Lab 05/08/19 1711 05/11/19 0801  AST 13*  --   ALT 11  --   ALKPHOS 109  --   BILITOT 0.3  --   PROT 7.9  --   ALBUMIN 4.2 3.6   No results for input(s): LIPASE, AMYLASE in the last 168 hours. No results for input(s): AMMONIA in the last 168 hours. CBC: Recent Labs  Lab 05/08/19 1711 05/10/19 0711  WBC 6.7 5.2   NEUTROABS 4.2  --   HGB 11.6* 10.9*  HCT 38.0 35.5*  MCV 91.6 90.8  PLT 322 265   Cardiac Enzymes: No results for input(s): CKTOTAL, CKMB, CKMBINDEX, TROPONINI in the last 168 hours. BNP: Invalid input(s): POCBNP CBG: No results for input(s): GLUCAP in the last 168 hours. D-Dimer No results for input(s): DDIMER in the last 72 hours. Hgb A1c Recent Labs    05/09/19 0547  HGBA1C 5.4   Lipid Profile Recent Labs    05/09/19 0547  CHOL 266*  HDL 55  LDLCALC 188*  TRIG 117  CHOLHDL 4.8   Thyroid function studies No results for input(s): TSH, T4TOTAL, T3FREE, THYROIDAB in the last 72 hours.  Invalid input(s): FREET3 Anemia work up No results for input(s): VITAMINB12, FOLATE, FERRITIN, TIBC, IRON, RETICCTPCT in the last 72 hours. Urinalysis    Component Value Date/Time   COLORURINE YELLOW 06/27/2018 1409   APPEARANCEUR CLEAR 06/27/2018 1409   LABSPEC 1.015 06/27/2018 1409   PHURINE 5.0 06/27/2018 1409   GLUCOSEU NEGATIVE 06/27/2018 1409   HGBUR NEGATIVE 06/27/2018 1409   BILIRUBINUR NEGATIVE 06/27/2018 1409   KETONESUR NEGATIVE 06/27/2018 1409   PROTEINUR 100 (A) 06/27/2018 1409   UROBILINOGEN 0.2 01/12/2010 1941   NITRITE NEGATIVE 06/27/2018 1409   LEUKOCYTESUR NEGATIVE 06/27/2018 1409   Sepsis Labs Invalid input(s): PROCALCITONIN,  WBC,  LACTICIDVEN Microbiology Recent Results (from the past 240 hour(s))  SARS CORONAVIRUS 2 (TAT 6-24 HRS) Nasopharyngeal Nasopharyngeal Swab     Status: None   Collection Time: 05/08/19  7:52 PM   Specimen: Nasopharyngeal Swab  Result Value Ref Range Status   SARS Coronavirus 2 NEGATIVE NEGATIVE Final    Comment: (NOTE) SARS-CoV-2 target nucleic acids are NOT DETECTED. The SARS-CoV-2 RNA is generally detectable in upper and lower respiratory specimens during the acute phase of infection. Negative results do not preclude SARS-CoV-2 infection, do not rule out co-infections with other pathogens, and should not be used as  the sole basis for treatment or other patient management decisions. Negative results must be combined with clinical observations, patient history, and epidemiological information. The expected result is Negative. Fact Sheet for Patients: SugarRoll.be Fact Sheet for Healthcare Providers: https://www.woods-mathews.com/ This test is not yet approved or cleared by the [place] FDA and  has been authorized for detection and/or diagnosis of SARS-CoV-2 by FDA under an Emergency Use Authorization (EUA). This EUA will remain  in effect (meaning this test can be used) for the duration of the COVID-19 declaration under Section 56  4(b)(1) of the Act, 21 U.S.C. section 360bbb-3(b)(1), unless the authorization is terminated or revoked sooner. Performed at [facility], [place] [address], [place], [place] [address]      Time coordinating discharge: 40 minutes      SIGNED:   Edwin Dada, MD  [facility] 05/11/2019, 5:42 PM

## 2019-05-11 NOTE — Progress Notes (Signed)
Subjective: No acute events overnight. Blood pressure improved. Headache has mostly resolved. Denies any chest pain, shortness of breath, n/v, lower extremity swelling.   Objective Vital signs in last 24 hours: Vitals:   05/10/19 0843 05/10/19 1700 05/10/19 2048 05/11/19 0939  BP: (!) 151/57 (!) 135/48 (!) 143/58 (!) 131/44  Pulse: 65 66 70 70  Resp: 18 18 16 18   Temp: 98.8 F (37.1 C) 98.4 F (36.9 C) 98.9 F (37.2 C) 99.1 F (37.3 C)  TempSrc: Oral Oral Oral Oral  SpO2: 100% 100% 98% 97%  Weight:      Height:       Weight change:   Intake/Output Summary (Last 24 hours) at 05/11/2019 1103 Last data filed at 05/11/2019 0200 Gross per 24 hour  Intake 1080 ml  Output 400 ml  Net 680 ml     Assessment/Plan: 1.HTN - Given rapid improvement with resumption of home meds along with a few adjustments, patient likely has primary HTN with poor medication compliance. Work-up to rule out secondary causes has been initiated and can be follow-up outpatient since she is otherwise stable for discharge.  Continue on amlodipine, hydralazine, imdur, newly increased terazosin, furosemide and newly added labetalol outpatient.   2. CKD IV - can follow-up with nephrologist at [place].    Delice Bison    Labs: Basic Metabolic Panel: Recent Labs  Lab 05/08/19 1711 05/10/19 0509 05/11/19 0801  NA 141 137 136  K 5.1 5.2* 4.2  CL 108 105 105  CO2 23 20* 18*  GLUCOSE 102* 102* 136*  BUN 28* 32* 37*  CREATININE 2.13* 2.14* 2.93*  CALCIUM 9.3 9.2 8.9  PHOS  --   --  4.8*   Liver Function Tests: Recent Labs  Lab 05/08/19 1711 05/11/19 0801  AST 13*  --   ALT 11  --   ALKPHOS 109  --   BILITOT 0.3  --   PROT 7.9  --   ALBUMIN 4.2 3.6   No results for input(s): LIPASE, AMYLASE in the last 168 hours. No results for input(s): AMMONIA in the last 168 hours. CBC: Recent Labs  Lab 05/08/19 1711 05/10/19 0711  WBC 6.7 5.2  NEUTROABS 4.2  --   HGB 11.6* 10.9*  HCT 38.0 35.5*   MCV 91.6 90.8  PLT 322 265   Cardiac Enzymes: No results for input(s): CKTOTAL, CKMB, CKMBINDEX, TROPONINI in the last 168 hours. CBG: No results for input(s): GLUCAP in the last 168 hours.  Iron Studies: No results for input(s): IRON, TIBC, TRANSFERRIN, FERRITIN in the last 72 hours. Studies/Results: Dg Abd 1 View  Result Date: 05/10/2019 CLINICAL DATA:  Diffuse abdominal pain EXAM: ABDOMEN - 1 VIEW COMPARISON:  12/31/2018 FINDINGS: Prior cholecystectomy. Moderate stool burden throughout the colon. No bowel obstruction or free air. No organomegaly or suspicious calcification. IMPRESSION: Moderate stool burden.  No acute findings. Electronically Signed   By: Rolm Baptise M.D.   On: 05/10/2019 11:15   Vas US Carotid (at [place] Only)  Result Date: 05/09/2019 Carotid Arterial Duplex Study Indications:       TIA. Risk Factors:      Hypertension. Limitations        Today's exam was limited due to the body habitus of the                    patient, the high bifurcation of the carotid and the  patient's respiratory variation. Comparison Study:  No prior study. Performing Technologist: Maudry Mayhew MHA, RDMS, RVT, RDCS  Examination Guidelines: A complete evaluation includes B-mode imaging, spectral Doppler, color Doppler, and power Doppler as needed of all accessible portions of each vessel. Bilateral testing is considered an integral part of a complete examination. Limited examinations for reoccurring indications may be performed as noted.  Right Carotid Findings: +----------+--------+--------+--------+-----------------------+--------+           PSV cm/sEDV cm/sStenosisPlaque Description     Comments +----------+--------+--------+--------+-----------------------+--------+ CCA Prox  112     15              smooth and homogeneous          +----------+--------+--------+--------+-----------------------+--------+ CCA Distal65      14              smooth and  homogeneous          +----------+--------+--------+--------+-----------------------+--------+ ICA Prox  81      22              smooth and heterogenous         +----------+--------+--------+--------+-----------------------+--------+ ICA Distal65      27                                              +----------+--------+--------+--------+-----------------------+--------+ ECA       93      11                                              +----------+--------+--------+--------+-----------------------+--------+ +----------+--------+-------+----------------+-------------------+           PSV cm/sEDV cmsDescribe        Arm Pressure (mmHG) +----------+--------+-------+----------------+-------------------+ [id number]            Multiphasic, WNL                    +----------+--------+-------+----------------+-------------------+ +---------+--------+--+--------+--+---------+ VertebralPSV cm/s52EDV cm/s21Antegrade +---------+--------+--+--------+--+---------+  Left Carotid Findings: +----------+--------+--------+--------+------------------+--------+           PSV cm/sEDV cm/sStenosisPlaque DescriptionComments +----------+--------+--------+--------+------------------+--------+ CCA Prox  120     20                                         +----------+--------+--------+--------+------------------+--------+ CCA Distal98      26                                         +----------+--------+--------+--------+------------------+--------+ ICA Prox  117     32                                         +----------+--------+--------+--------+------------------+--------+ ICA Distal75      24                                         +----------+--------+--------+--------+------------------+--------+ ECA       161  21                                         +----------+--------+--------+--------+------------------+--------+  +----------+--------+--------+----------------+-------------------+           PSV cm/sEDV cm/sDescribe        Arm Pressure (mmHG) +----------+--------+--------+----------------+-------------------+ [id number]             Multiphasic, WNL                    +----------+--------+--------+----------------+-------------------+ +---------+--------+--+--------+--+---------+ VertebralPSV cm/s55EDV cm/s19Antegrade +---------+--------+--+--------+--+---------+  Summary: Right Carotid: Velocities in the right ICA are consistent with a 1-39% stenosis. Left Carotid: Velocities in the left ICA are consistent with a 1-39% stenosis. Vertebrals:  Bilateral vertebral arteries demonstrate antegrade flow. Subclavians: Normal flow hemodynamics were seen in bilateral subclavian              arteries. *See table(s) above for measurements and observations.  Electronically signed by Deitra Mayo MD on 05/09/2019 at 3:08:44 PM.    Final    Medications: Infusions: . sodium chloride 500 mL (05/08/19 1723)    Scheduled Medications: . acetaminophen  650 mg Oral TID  . amLODipine  10 mg Oral Daily  . aspirin  81 mg Oral Daily  . atorvastatin  40 mg Oral q1800  . enoxaparin (LOVENOX) injection  40 mg Subcutaneous Q24H  . ferrous sulfate  325 mg Oral Q breakfast  . FLUoxetine  40 mg Oral Daily  . furosemide  20 mg Oral Daily  . gabapentin  600 mg Oral BID  . hydrALAZINE  100 mg Oral TID  . isosorbide mononitrate  60 mg Oral Daily  . labetalol  200 mg Oral BID  . lidocaine  1 patch Transdermal Q24H  . multivitamin with minerals  1 tablet Oral Daily  . nicotine  21 mg Transdermal Daily  . oxybutynin  5 mg Oral QHS  . pantoprazole  40 mg Oral BID  . rOPINIRole  4 mg Oral QHS  . terazosin  5 mg Oral QHS  . varenicline  1 mg Oral BID    have reviewed scheduled and prn medications.  Physical Exam: General: awake, alert, lying in bed in NAD Heart: RRR Lungs: CTAB Abdomen: soft, non-tender,  non-distended  Extremities: no edema   05/11/2019,11:03 AM  LOS: 2 days

## 2019-05-11 NOTE — Progress Notes (Signed)
CSW provided a cab voucher for the safe discharge home of the patient.   CSW signing off as per the patient is discharging home. Please re-consult if any other needs arise.   Domenic Schwab, MSW, [place] Worker [facility]  [phone]

## 2019-05-11 NOTE — TOC Transition Note (Signed)
Transition of Care [facility]) - CM/SW Discharge Note   Patient Details  Name: Sherry [name] MRN: [id number] Date of Birth: 08-01-1955  [facility] CM/SW Contact:  [name], RN Phone Number: 05/11/2019, 9:32 AM   Clinical Narrative:   [name] w patient on the phone (working remotely). She states that she would like HH over OP therapies. Discussed Medicare ratings and she would like [name]. She states she will need taxi voucher, I made [name] w CSW aware. No other CM needs identified at this time.    Final next level of care: [name] Barriers to Discharge: No Barriers Identified   Patient Goals and CMS Choice Patient states their goals for this hospitalization and ongoing recovery are:: to go home CMS Medicare.gov Compare Post Acute Care list provided to:: Patient Choice offered to / list presented to : Patient  Discharge Placement                       Discharge Plan and Services   Discharge Planning Services: CM Consult                      HH Arranged: RN, PT, Disease Management, OT, Nurse's Aide, Social Work [name] Agency: [name] Date [facility] Agency Contacted: 05/11/19 Time [place]: [phone] Representative spoke with at [place]: [place] ([place]) Interventions     Readmission Risk Interventions No flowsheet data found.

## 2019-05-12 ENCOUNTER — Ambulatory Visit: Payer: Medicare HMO | Admitting: Cardiology

## 2019-05-13 DIAGNOSIS — N184 Chronic kidney disease, stage 4 (severe): Secondary | ICD-10-CM | POA: Diagnosis not present

## 2019-05-13 DIAGNOSIS — I5020 Unspecified systolic (congestive) heart failure: Secondary | ICD-10-CM | POA: Diagnosis not present

## 2019-05-13 DIAGNOSIS — G8929 Other chronic pain: Secondary | ICD-10-CM | POA: Diagnosis not present

## 2019-05-13 DIAGNOSIS — M5442 Lumbago with sciatica, left side: Secondary | ICD-10-CM | POA: Diagnosis not present

## 2019-05-13 DIAGNOSIS — I5033 Acute on chronic diastolic (congestive) heart failure: Secondary | ICD-10-CM | POA: Diagnosis not present

## 2019-05-13 DIAGNOSIS — D631 Anemia in chronic kidney disease: Secondary | ICD-10-CM | POA: Diagnosis not present

## 2019-05-13 DIAGNOSIS — M5441 Lumbago with sciatica, right side: Secondary | ICD-10-CM | POA: Diagnosis not present

## 2019-05-13 DIAGNOSIS — I130 Hypertensive heart and chronic kidney disease with heart failure and stage 1 through stage 4 chronic kidney disease, or unspecified chronic kidney disease: Secondary | ICD-10-CM | POA: Diagnosis not present

## 2019-05-13 DIAGNOSIS — G2401 Drug induced subacute dyskinesia: Secondary | ICD-10-CM | POA: Diagnosis not present

## 2019-05-14 DIAGNOSIS — G8929 Other chronic pain: Secondary | ICD-10-CM | POA: Diagnosis not present

## 2019-05-14 DIAGNOSIS — M5442 Lumbago with sciatica, left side: Secondary | ICD-10-CM | POA: Diagnosis not present

## 2019-05-14 DIAGNOSIS — M5441 Lumbago with sciatica, right side: Secondary | ICD-10-CM | POA: Diagnosis not present

## 2019-05-14 DIAGNOSIS — I5020 Unspecified systolic (congestive) heart failure: Secondary | ICD-10-CM | POA: Diagnosis not present

## 2019-05-14 DIAGNOSIS — N184 Chronic kidney disease, stage 4 (severe): Secondary | ICD-10-CM | POA: Diagnosis not present

## 2019-05-14 DIAGNOSIS — D631 Anemia in chronic kidney disease: Secondary | ICD-10-CM | POA: Diagnosis not present

## 2019-05-14 DIAGNOSIS — I5033 Acute on chronic diastolic (congestive) heart failure: Secondary | ICD-10-CM | POA: Diagnosis not present

## 2019-05-14 DIAGNOSIS — G2401 Drug induced subacute dyskinesia: Secondary | ICD-10-CM | POA: Diagnosis not present

## 2019-05-14 DIAGNOSIS — I130 Hypertensive heart and chronic kidney disease with heart failure and stage 1 through stage 4 chronic kidney disease, or unspecified chronic kidney disease: Secondary | ICD-10-CM | POA: Diagnosis not present

## 2019-05-16 LAB — VITAMIN D 25 HYDROXY (VIT D DEFICIENCY, FRACTURES): Vit D, 25-Hydroxy: 14

## 2019-05-16 LAB — HM HEPATITIS C SCREENING LAB: HM Hepatitis Screen: NEGATIVE

## 2019-05-17 ENCOUNTER — Telehealth: Payer: Self-pay | Admitting: Family Medicine

## 2019-05-17 ENCOUNTER — Ambulatory Visit: Payer: Medicare HMO | Attending: Neurology

## 2019-05-17 DIAGNOSIS — D631 Anemia in chronic kidney disease: Secondary | ICD-10-CM | POA: Diagnosis not present

## 2019-05-17 DIAGNOSIS — G8929 Other chronic pain: Secondary | ICD-10-CM | POA: Diagnosis not present

## 2019-05-17 DIAGNOSIS — N184 Chronic kidney disease, stage 4 (severe): Secondary | ICD-10-CM | POA: Diagnosis not present

## 2019-05-17 DIAGNOSIS — I5033 Acute on chronic diastolic (congestive) heart failure: Secondary | ICD-10-CM | POA: Diagnosis not present

## 2019-05-17 DIAGNOSIS — I130 Hypertensive heart and chronic kidney disease with heart failure and stage 1 through stage 4 chronic kidney disease, or unspecified chronic kidney disease: Secondary | ICD-10-CM | POA: Diagnosis not present

## 2019-05-17 DIAGNOSIS — M5441 Lumbago with sciatica, right side: Secondary | ICD-10-CM | POA: Diagnosis not present

## 2019-05-17 DIAGNOSIS — M5442 Lumbago with sciatica, left side: Secondary | ICD-10-CM | POA: Diagnosis not present

## 2019-05-17 DIAGNOSIS — G2401 Drug induced subacute dyskinesia: Secondary | ICD-10-CM | POA: Diagnosis not present

## 2019-05-17 DIAGNOSIS — I5020 Unspecified systolic (congestive) heart failure: Secondary | ICD-10-CM | POA: Diagnosis not present

## 2019-05-17 NOTE — Telephone Encounter (Signed)
Copied from [place] [id number]. Topic: General - Other >> May 17, 2019 10:35 AM Yvette Rack wrote: Reason for CRM: Sreejesh with [employer] requests Rx for rollator. Fax# [phone] Cb# [phone]

## 2019-05-17 NOTE — Telephone Encounter (Signed)
Okayed the verbal orders for Speech Therapy from Olean Ree from [facility]. Once every other week for four weeks.

## 2019-05-19 DIAGNOSIS — I5033 Acute on chronic diastolic (congestive) heart failure: Secondary | ICD-10-CM | POA: Diagnosis not present

## 2019-05-19 DIAGNOSIS — G8929 Other chronic pain: Secondary | ICD-10-CM | POA: Diagnosis not present

## 2019-05-19 DIAGNOSIS — G2401 Drug induced subacute dyskinesia: Secondary | ICD-10-CM | POA: Diagnosis not present

## 2019-05-19 DIAGNOSIS — D631 Anemia in chronic kidney disease: Secondary | ICD-10-CM | POA: Diagnosis not present

## 2019-05-19 DIAGNOSIS — M5441 Lumbago with sciatica, right side: Secondary | ICD-10-CM | POA: Diagnosis not present

## 2019-05-19 DIAGNOSIS — I5020 Unspecified systolic (congestive) heart failure: Secondary | ICD-10-CM | POA: Diagnosis not present

## 2019-05-19 DIAGNOSIS — I130 Hypertensive heart and chronic kidney disease with heart failure and stage 1 through stage 4 chronic kidney disease, or unspecified chronic kidney disease: Secondary | ICD-10-CM | POA: Diagnosis not present

## 2019-05-19 DIAGNOSIS — N184 Chronic kidney disease, stage 4 (severe): Secondary | ICD-10-CM | POA: Diagnosis not present

## 2019-05-19 DIAGNOSIS — M5442 Lumbago with sciatica, left side: Secondary | ICD-10-CM | POA: Diagnosis not present

## 2019-05-22 DIAGNOSIS — G2401 Drug induced subacute dyskinesia: Secondary | ICD-10-CM | POA: Diagnosis not present

## 2019-05-22 DIAGNOSIS — I130 Hypertensive heart and chronic kidney disease with heart failure and stage 1 through stage 4 chronic kidney disease, or unspecified chronic kidney disease: Secondary | ICD-10-CM | POA: Diagnosis not present

## 2019-05-22 DIAGNOSIS — I5020 Unspecified systolic (congestive) heart failure: Secondary | ICD-10-CM | POA: Diagnosis not present

## 2019-05-22 DIAGNOSIS — I5033 Acute on chronic diastolic (congestive) heart failure: Secondary | ICD-10-CM | POA: Diagnosis not present

## 2019-05-22 DIAGNOSIS — D631 Anemia in chronic kidney disease: Secondary | ICD-10-CM | POA: Diagnosis not present

## 2019-05-22 DIAGNOSIS — G8929 Other chronic pain: Secondary | ICD-10-CM | POA: Diagnosis not present

## 2019-05-22 DIAGNOSIS — M5442 Lumbago with sciatica, left side: Secondary | ICD-10-CM | POA: Diagnosis not present

## 2019-05-22 DIAGNOSIS — N184 Chronic kidney disease, stage 4 (severe): Secondary | ICD-10-CM | POA: Diagnosis not present

## 2019-05-22 DIAGNOSIS — M5441 Lumbago with sciatica, right side: Secondary | ICD-10-CM | POA: Diagnosis not present

## 2019-05-23 DIAGNOSIS — R6889 Other general symptoms and signs: Secondary | ICD-10-CM | POA: Diagnosis not present

## 2019-05-23 DIAGNOSIS — E559 Vitamin D deficiency, unspecified: Secondary | ICD-10-CM | POA: Diagnosis not present

## 2019-05-23 DIAGNOSIS — N184 Chronic kidney disease, stage 4 (severe): Secondary | ICD-10-CM | POA: Diagnosis not present

## 2019-05-23 DIAGNOSIS — N189 Chronic kidney disease, unspecified: Secondary | ICD-10-CM | POA: Diagnosis not present

## 2019-05-23 DIAGNOSIS — Z8619 Personal history of other infectious and parasitic diseases: Secondary | ICD-10-CM | POA: Diagnosis not present

## 2019-05-23 DIAGNOSIS — I129 Hypertensive chronic kidney disease with stage 1 through stage 4 chronic kidney disease, or unspecified chronic kidney disease: Secondary | ICD-10-CM | POA: Diagnosis not present

## 2019-05-23 DIAGNOSIS — D631 Anemia in chronic kidney disease: Secondary | ICD-10-CM | POA: Diagnosis not present

## 2019-05-24 ENCOUNTER — Other Ambulatory Visit (HOSPITAL_BASED_OUTPATIENT_CLINIC_OR_DEPARTMENT_OTHER): Payer: Self-pay | Admitting: Nephrology

## 2019-05-24 ENCOUNTER — Ambulatory Visit: Payer: Medicare HMO | Admitting: Physical Therapy

## 2019-05-24 ENCOUNTER — Other Ambulatory Visit (HOSPITAL_COMMUNITY): Payer: Self-pay | Admitting: Nephrology

## 2019-05-24 DIAGNOSIS — I5020 Unspecified systolic (congestive) heart failure: Secondary | ICD-10-CM | POA: Diagnosis not present

## 2019-05-24 DIAGNOSIS — D631 Anemia in chronic kidney disease: Secondary | ICD-10-CM | POA: Diagnosis not present

## 2019-05-24 DIAGNOSIS — M5441 Lumbago with sciatica, right side: Secondary | ICD-10-CM | POA: Diagnosis not present

## 2019-05-24 DIAGNOSIS — I5033 Acute on chronic diastolic (congestive) heart failure: Secondary | ICD-10-CM | POA: Diagnosis not present

## 2019-05-24 DIAGNOSIS — M5442 Lumbago with sciatica, left side: Secondary | ICD-10-CM | POA: Diagnosis not present

## 2019-05-24 DIAGNOSIS — I10 Essential (primary) hypertension: Secondary | ICD-10-CM

## 2019-05-24 DIAGNOSIS — G8929 Other chronic pain: Secondary | ICD-10-CM | POA: Diagnosis not present

## 2019-05-24 DIAGNOSIS — G2401 Drug induced subacute dyskinesia: Secondary | ICD-10-CM | POA: Diagnosis not present

## 2019-05-24 DIAGNOSIS — N184 Chronic kidney disease, stage 4 (severe): Secondary | ICD-10-CM | POA: Diagnosis not present

## 2019-05-24 DIAGNOSIS — I130 Hypertensive heart and chronic kidney disease with heart failure and stage 1 through stage 4 chronic kidney disease, or unspecified chronic kidney disease: Secondary | ICD-10-CM | POA: Diagnosis not present

## 2019-05-26 DIAGNOSIS — I130 Hypertensive heart and chronic kidney disease with heart failure and stage 1 through stage 4 chronic kidney disease, or unspecified chronic kidney disease: Secondary | ICD-10-CM | POA: Diagnosis not present

## 2019-05-26 DIAGNOSIS — G8929 Other chronic pain: Secondary | ICD-10-CM | POA: Diagnosis not present

## 2019-05-26 DIAGNOSIS — G2401 Drug induced subacute dyskinesia: Secondary | ICD-10-CM | POA: Diagnosis not present

## 2019-05-26 DIAGNOSIS — D631 Anemia in chronic kidney disease: Secondary | ICD-10-CM | POA: Diagnosis not present

## 2019-05-26 DIAGNOSIS — N184 Chronic kidney disease, stage 4 (severe): Secondary | ICD-10-CM | POA: Diagnosis not present

## 2019-05-26 DIAGNOSIS — I5020 Unspecified systolic (congestive) heart failure: Secondary | ICD-10-CM | POA: Diagnosis not present

## 2019-05-26 DIAGNOSIS — I5033 Acute on chronic diastolic (congestive) heart failure: Secondary | ICD-10-CM | POA: Diagnosis not present

## 2019-05-26 DIAGNOSIS — M5442 Lumbago with sciatica, left side: Secondary | ICD-10-CM | POA: Diagnosis not present

## 2019-05-26 DIAGNOSIS — M5441 Lumbago with sciatica, right side: Secondary | ICD-10-CM | POA: Diagnosis not present

## 2019-05-29 ENCOUNTER — Other Ambulatory Visit: Payer: Self-pay

## 2019-05-29 ENCOUNTER — Encounter: Payer: Self-pay | Admitting: Cardiology

## 2019-05-29 ENCOUNTER — Ambulatory Visit (INDEPENDENT_AMBULATORY_CARE_PROVIDER_SITE_OTHER): Payer: Medicare HMO | Admitting: Cardiology

## 2019-05-29 VITALS — BP 128/64 | HR 72 | Ht 62.0 in | Wt 246.1 lb

## 2019-05-29 DIAGNOSIS — R6889 Other general symptoms and signs: Secondary | ICD-10-CM | POA: Diagnosis not present

## 2019-05-29 DIAGNOSIS — I10 Essential (primary) hypertension: Secondary | ICD-10-CM

## 2019-05-29 DIAGNOSIS — Z6841 Body Mass Index (BMI) 40.0 and over, adult: Secondary | ICD-10-CM | POA: Diagnosis not present

## 2019-05-29 DIAGNOSIS — I110 Hypertensive heart disease with heart failure: Secondary | ICD-10-CM | POA: Diagnosis not present

## 2019-05-29 DIAGNOSIS — F17210 Nicotine dependence, cigarettes, uncomplicated: Secondary | ICD-10-CM

## 2019-05-29 DIAGNOSIS — I5031 Acute diastolic (congestive) heart failure: Secondary | ICD-10-CM

## 2019-05-29 NOTE — Progress Notes (Signed)
Cardiology Office Note:    Date:  05/29/2019   ID:  Sherry [name], DOB Jul 28, 1955, MRN [id number]  PCP:  Forrest Moron, MD  Cardiologist:  Jenean Lindau, MD   Referring MD: Forrest Moron, MD    ASSESSMENT:    1. Essential hypertension   2. Acute diastolic CHF (congestive heart failure) ([place])   3. Cigarette smoker    PLAN:    In order of problems listed above:  1. Essential hypertension: Blood pressure is stable.  Diet was discussed.  Importance of compliance stressed.  Salt reduction issues were considered in the discussion and she understands it.  She plans to do better. 2. Morbid obesity: Diet was discussed and risks of obesity explained and she plans to diet and lose weight. 3. Advanced renal insufficiency: Managed by her primary care physician and nephrologist. 4. Results of the echocardiogram and stress test were discussed with the patient at extensive length 5. Patient will be seen in follow-up appointment in 6 months or earlier if the patient has any concerns    Medication Adjustments/Labs and Tests Ordered: Current medicines are reviewed at length with the patient today.  Concerns regarding medicines are outlined above.  No orders of the defined types were placed in this encounter.  No orders of the defined types were placed in this encounter.    No chief complaint on file.    History of Present Illness:    Sherry [name] is a 63 y.o. female.  Patient has past medical history of essential hypertension and congestive heart failure and advanced renal insufficiency.  She denies any problems at this time and takes care of activities of daily living.  No chest pain orthopnea or PND.  At the time of my evaluation, the patient is alert awake oriented and in no distress.  Past Medical History:  Diagnosis Date   Anxiety    Bipolar and related disorder ([place])    Blood transfusion without reported diagnosis    Breast cancer ([place])    right lumpectemy and lymph  node removal   CHF (congestive heart failure) ([place])    Chronic back pain    Chronic kidney disease    "Stage IV" - states due to hypertension   Depression    GERD (gastroesophageal reflux disease)    Hepatitis C    Hypertension    Left-sided weakness    MRSA infection    of breast incision   Opioid dependence ([place])    Has been treated in [place] in past   Substance abuse ([place])    TIA (transient ischemic attack) 2020   P/w BP >230/120 and neurologic symptoms, presumed TIA    Past Surgical History:  Procedure Laterality Date   BACK SURGERY  1990   BREAST SURGERY Right 2010   "breast cancer survivor" - states partial mastectomy and nodes   COLONOSCOPY     [facility]   ESOPHAGOGASTRODUODENOSCOPY     [facility]   LAPAROSCOPIC CHOLECYSTECTOMY  2002   LAPAROSCOPIC INCISIONAL / UMBILICAL / [place]  04/13/2018   with BARD 15x 53GD mesh (supraumbilical)   LAPAROSCOPIC LYSIS OF ADHESIONS  07/12/2018   Procedure: LAPAROSCOPIC LYSIS OF ADHESIONS;  Surgeon: Isabel Caprice, MD;  Location: [facility];  Service: Gynecology;;   MYOMECTOMY     x 2 prior to hysterectomy   ROBOTIC ASSISTED BILATERAL SALPINGO OOPHERECTOMY Right 07/12/2018   Procedure: XI ROBOTIC ASSISTED RIGHT SALPINGO OOPHORECTOMY;  Surgeon: Isabel Caprice, MD;  Location: [facility];  Service: Gynecology;  Laterality: Right;   TOTAL ABDOMINAL HYSTERECTOMY     fibroids    UPPER GASTROINTESTINAL ENDOSCOPY      Current Medications: Current Meds  Medication Sig   amLODipine (NORVASC) 10 MG tablet TAKE 1 TABLET (10 MG TOTAL) BY MOUTH DAILY.   aspirin 81 MG chewable tablet Chew 1 tablet (81 mg total) by mouth daily.   atorvastatin (LIPITOR) 40 MG tablet Take 1 tablet (40 mg total) by mouth daily at 6 PM.   ferrous sulfate 325 (65 FE) MG tablet Take 1 tablet (325 mg total) by mouth daily with breakfast.   FLUoxetine (PROZAC) 40 MG capsule Take 40 mg by mouth daily.      furosemide (LASIX) 20 MG tablet Take 1 tablet (20 mg total) by mouth daily.   gabapentin (NEURONTIN) 600 MG tablet Take 1 tablet (600 mg total) by mouth 2 (two) times daily.   hydrALAZINE (APRESOLINE) 25 MG tablet Take 1 tablet (25 mg total) by mouth 3 (three) times daily.   hydrOXYzine (VISTARIL) 25 MG capsule Take 25 mg by mouth 4 (four) times daily as needed for anxiety.   INGREZZA 80 MG CAPS Take 80 mg by mouth daily.   isosorbide mononitrate (IMDUR) 60 MG 24 hr tablet TAKE 1 TABLET (60 MG TOTAL) BY MOUTH DAILY.   labetalol (NORMODYNE) 200 MG tablet Take 1 tablet (200 mg total) by mouth 2 (two) times daily.   Multiple Vitamins-Minerals (ADULT GUMMY) CHEW Chew 1 tablet by mouth daily.   nicotine (NICODERM CQ - DOSED IN MG/24 HOURS) 21 mg/24hr patch Place 1 patch (21 mg total) onto the skin daily.   ondansetron (ZOFRAN) 4 MG tablet Take 1 tablet (4 mg total) by mouth every 8 (eight) hours as needed for nausea or vomiting.   pantoprazole (PROTONIX) 20 MG tablet Take 20 mg by mouth 2 (two) times daily.   terazosin (HYTRIN) 2 MG capsule Take 1 capsule (2 mg total) by mouth at bedtime.   varenicline (CHANTIX CONTINUING MONTH PAK) 1 MG tablet Take 1 tablet (1 mg total) by mouth 2 (two) times daily.     Allergies:   Abilify [aripiprazole], Remeron [mirtazapine], Trazodone and nefazodone, Flexeril [cyclobenzaprine], and Amoxicillin   Social History   Socioeconomic History   Marital status: Single    Spouse name: Not on file   Number of children: 0   Years of education: 12   Highest education level: High school graduate  Occupational History   Occupation: Disabled  Scientist, product/process development strain: Not on file   Food insecurity    Worry: Not on file    Inability: Not on file   Transportation needs    Medical: Not on file    Non-medical: Not on file  Tobacco Use   Smoking status: Current Every Day Smoker    Packs/day: 0.25    Types: Cigarettes    Smokeless tobacco: Never Used   Tobacco comment: 4 cigs per day 01/2019  Substance and Sexual Activity   Alcohol use: No   Drug use: Not Currently    Comment: h/o IVD use   Sexual activity: Not Currently  Lifestyle   Physical activity    Days per week: Not on file    Minutes per session: Not on file   Stress: Not on file  Relationships   Social connections    Talks on phone: Not on file    Gets together: Not on file    Attends  religious service: Not on file    Active member of club or organization: Not on file    Attends meetings of clubs or organizations: Not on file    Relationship status: Not on file  Other Topics Concern   Not on file  Social History Narrative   Lives at home alone.   Right-handed.   Caffeine use: 4 cups coffee/soda     Family History: The patient's family history includes Breast cancer (age of onset: 34) in her mother; Cancer (age of onset: 79) in her father; Dementia in her mother; Heart attack in her mother. There is no history of Colon cancer, Esophageal cancer, Stomach cancer, or Rectal cancer.  ROS:   Please see the history of present illness.    All other systems reviewed and are negative.  EKGs/Labs/Other Studies Reviewed:    The following studies were reviewed today: IMPRESSIONS    1. Left ventricular ejection fraction, by visual estimation, is 65 to 70%. The left ventricle has hyperdynamic function. Normal left ventricular size. There is mildly increased left ventricular hypertrophy.  2. Elevated mean left atrial pressure.  3. Left ventricular diastolic Doppler parameters are consistent with pseudonormalization pattern of LV diastolic filling.  4. Global right ventricle has normal systolic function.The right ventricular size is normal. No increase in right ventricular wall thickness.  5. Left atrial size was mildly dilated.  6. Right atrial size was normal.  7. The mitral valve is normal in structure. No evidence of mitral valve  regurgitation.  8. The tricuspid valve is normal in structure. Tricuspid valve regurgitation was not visualized by color flow Doppler.  9. The aortic valve is normal in structure. Aortic valve regurgitation was not visualized by color flow Doppler. 10. The pulmonic valve was not well visualized. Pulmonic valve regurgitation is trivial by color flow Doppler. 11. TR signal is inadequate for assessing pulmonary artery systolic pressure.  Summary: Right Carotid: Velocities in the right ICA are consistent with a 1-39% stenosis.  Left Carotid: Velocities in the left ICA are consistent with a 1-39% stenosis.     Recent Labs: 05/08/2019: ALT 11 05/09/2019: B Natriuretic Peptide 73.4 05/10/2019: Hemoglobin 10.9; Platelets 265 05/11/2019: BUN 37; Creatinine, Ser 2.93; Potassium 4.2; Sodium 136  Recent Lipid Panel    Component Value Date/Time   CHOL 266 (H) 05/09/2019 0547   TRIG 117 05/09/2019 0547   HDL 55 05/09/2019 0547   CHOLHDL 4.8 05/09/2019 0547   VLDL 23 05/09/2019 0547   LDLCALC 188 (H) 05/09/2019 0547    Physical Exam:    VS:  BP 128/64 (BP Location: Left Arm, Patient Position: Sitting, Cuff Size: Normal)    Pulse 72    Ht 5\' 2"  (1.575 m)    Wt 246 lb 1.3 oz (111.6 kg)    SpO2 99%    BMI 45.01 kg/m     Wt Readings from Last 3 Encounters:  05/29/19 246 lb 1.3 oz (111.6 kg)  05/08/19 242 lb (109.8 kg)  05/08/19 242 lb 1.3 oz (109.8 kg)     GEN: Patient is in no acute distress HEENT: Normal NECK: No JVD; No carotid bruits LYMPHATICS: No lymphadenopathy CARDIAC: Hear sounds regular, 2/6 systolic murmur at the apex. RESPIRATORY:  Clear to auscultation without rales, wheezing or rhonchi  ABDOMEN: Soft, non-tender, non-distended MUSCULOSKELETAL:  No edema; No deformity  SKIN: Warm and dry NEUROLOGIC:  Alert and oriented x 3 PSYCHIATRIC:  Normal affect   Signed, Jenean Lindau, MD  05/29/2019 3:49 PM  [facility]

## 2019-05-29 NOTE — Patient Instructions (Signed)
Medication Instructions:  Your physician recommends that you continue on your current medications as directed. Please refer to the Current Medication list given to you today.  *If you need a refill on your cardiac medications before your next appointment, please call your pharmacy*  Lab Work: NONE If you have labs (blood work) drawn today and your tests are completely normal, you will receive your results only by: . MyChart Message (if you have MyChart) OR . A paper copy in the mail If you have any lab test that is abnormal or we need to change your treatment, we will call you to review the results.  Testing/Procedures: NONE  Follow-Up: At [facility], you and your health needs are our priority.  As part of our continuing mission to provide you with exceptional heart care, we have created designated Provider Care Teams.  These Care Teams include your primary Cardiologist (physician) and Advanced Practice Providers (APPs -  Physician Assistants and Nurse Practitioners) who all work together to provide you with the care you need, when you need it.  Your next appointment:   6 month(s)  The format for your next appointment:   In Person  Provider:   Rajan Revankar, MD    

## 2019-05-30 ENCOUNTER — Telehealth: Payer: Self-pay | Admitting: Family Medicine

## 2019-05-30 DIAGNOSIS — D631 Anemia in chronic kidney disease: Secondary | ICD-10-CM | POA: Diagnosis not present

## 2019-05-30 DIAGNOSIS — I5033 Acute on chronic diastolic (congestive) heart failure: Secondary | ICD-10-CM | POA: Diagnosis not present

## 2019-05-30 DIAGNOSIS — G2401 Drug induced subacute dyskinesia: Secondary | ICD-10-CM | POA: Diagnosis not present

## 2019-05-30 DIAGNOSIS — I5020 Unspecified systolic (congestive) heart failure: Secondary | ICD-10-CM | POA: Diagnosis not present

## 2019-05-30 DIAGNOSIS — G8929 Other chronic pain: Secondary | ICD-10-CM | POA: Diagnosis not present

## 2019-05-30 DIAGNOSIS — N184 Chronic kidney disease, stage 4 (severe): Secondary | ICD-10-CM | POA: Diagnosis not present

## 2019-05-30 DIAGNOSIS — M5442 Lumbago with sciatica, left side: Secondary | ICD-10-CM | POA: Diagnosis not present

## 2019-05-30 DIAGNOSIS — M5441 Lumbago with sciatica, right side: Secondary | ICD-10-CM | POA: Diagnosis not present

## 2019-05-30 DIAGNOSIS — I130 Hypertensive heart and chronic kidney disease with heart failure and stage 1 through stage 4 chronic kidney disease, or unspecified chronic kidney disease: Secondary | ICD-10-CM | POA: Diagnosis not present

## 2019-05-30 NOTE — Telephone Encounter (Signed)
Copied from [place] [id number]. Topic: General - Other >> May 30, 2019  2:04 PM Ivar Drape wrote: Reason for CRM:   Pt has a 56mth old CT Scan referral that she No Showed on.  Upon speaking to her, she said she did want the scan but she will need some kind of prescription to keep her calm through the scan because she is claustrophobic.

## 2019-05-31 ENCOUNTER — Encounter (HOSPITAL_BASED_OUTPATIENT_CLINIC_OR_DEPARTMENT_OTHER): Payer: Medicare HMO

## 2019-06-01 ENCOUNTER — Encounter (HOSPITAL_BASED_OUTPATIENT_CLINIC_OR_DEPARTMENT_OTHER): Payer: Self-pay | Admitting: *Deleted

## 2019-06-01 ENCOUNTER — Ambulatory Visit (HOSPITAL_BASED_OUTPATIENT_CLINIC_OR_DEPARTMENT_OTHER): Admission: RE | Admit: 2019-06-01 | Payer: Medicare HMO | Source: Ambulatory Visit

## 2019-06-01 ENCOUNTER — Emergency Department (HOSPITAL_BASED_OUTPATIENT_CLINIC_OR_DEPARTMENT_OTHER)
Admission: EM | Admit: 2019-06-01 | Discharge: 2019-06-01 | Disposition: A | Discharge status: Home / Self Care | Payer: Medicare HMO | Attending: Emergency Medicine | Admitting: Emergency Medicine

## 2019-06-01 ENCOUNTER — Other Ambulatory Visit: Payer: Self-pay

## 2019-06-01 DIAGNOSIS — F17210 Nicotine dependence, cigarettes, uncomplicated: Secondary | ICD-10-CM | POA: Diagnosis not present

## 2019-06-01 DIAGNOSIS — Z888 Allergy status to other drugs, medicaments and biological substances status: Secondary | ICD-10-CM | POA: Diagnosis not present

## 2019-06-01 DIAGNOSIS — R6889 Other general symptoms and signs: Secondary | ICD-10-CM | POA: Diagnosis not present

## 2019-06-01 DIAGNOSIS — G8190 Hemiplegia, unspecified affecting unspecified side: Secondary | ICD-10-CM | POA: Diagnosis not present

## 2019-06-01 DIAGNOSIS — Z79899 Other long term (current) drug therapy: Secondary | ICD-10-CM | POA: Insufficient documentation

## 2019-06-01 DIAGNOSIS — N183 Chronic kidney disease, stage 3 unspecified: Secondary | ICD-10-CM | POA: Diagnosis not present

## 2019-06-01 DIAGNOSIS — M5441 Lumbago with sciatica, right side: Secondary | ICD-10-CM

## 2019-06-01 DIAGNOSIS — I130 Hypertensive heart and chronic kidney disease with heart failure and stage 1 through stage 4 chronic kidney disease, or unspecified chronic kidney disease: Secondary | ICD-10-CM | POA: Diagnosis not present

## 2019-06-01 DIAGNOSIS — M5431 Sciatica, right side: Secondary | ICD-10-CM | POA: Insufficient documentation

## 2019-06-01 DIAGNOSIS — I5032 Chronic diastolic (congestive) heart failure: Secondary | ICD-10-CM | POA: Diagnosis not present

## 2019-06-01 DIAGNOSIS — Z880 Allergy status to penicillin: Secondary | ICD-10-CM | POA: Diagnosis not present

## 2019-06-01 DIAGNOSIS — Z7982 Long term (current) use of aspirin: Secondary | ICD-10-CM | POA: Diagnosis not present

## 2019-06-01 DIAGNOSIS — M545 Low back pain: Secondary | ICD-10-CM | POA: Diagnosis present

## 2019-06-01 MED ORDER — MORPHINE SULFATE (PF) 4 MG/ML IV SOLN
6.0000 mg | Freq: Once | INTRAVENOUS | Status: AC
  Administered 2019-06-01: 6 mg via INTRAMUSCULAR
  Filled 2019-06-01: qty 2

## 2019-06-01 MED ORDER — HYDROCODONE-ACETAMINOPHEN 5-325 MG PO TABS: 1.0000 | ORAL_TABLET | Freq: Four times a day (QID) | ORAL | 0 refills | Status: DC | PRN

## 2019-06-01 MED ORDER — ALPRAZOLAM 1 MG PO TABS: 1.0000 mg | ORAL_TABLET | Freq: Once | ORAL | 0 refills | Status: DC | PRN

## 2019-06-01 MED FILL — HYDROCODON-APAP 5-325: 5-325 | 3 days supply | Qty: 10 | Fill #0

## 2019-06-01 NOTE — ED Triage Notes (Signed)
Back pain for 2 days.  Denies injury.

## 2019-06-01 NOTE — ED Provider Notes (Signed)
[facility] EMERGENCY DEPARTMENT Provider Note   CSN: [id number] Arrival date & time: 06/01/19  1779     History   Chief Complaint Chief Complaint  Patient presents with  . Back Pain    HPI Sherry [name] is a 63 y.o. female.     Patient is a 63 year old female who presents with back pain.  She has a history of chronic back pain.  She had surgery on her back in 1990.  She states that it flares up from time to time and is been worse over the last 2 days.  She describes an achy pain across her low back.  There is some radiation down her right leg.  She denies any numbness or weakness to her legs.  No loss of bowel or bladder control.  She states that she uses gabapentin at home but has been worse over the last 2 days and she did not get much sleep last night.  She also states that she has lidocaine patches at home but she has not been using them.  She has not recently seen a spine specialist.  She was admitted recently for elevated blood pressures and a questionable TIA.  She states her blood pressures have been well controlled at home and she checked it yesterday and her systolic blood pressure was around 130.  She says it is high this morning because she is in pain.  She states that she is compliant with her blood pressure medications.  She has no associated abdominal pain.  No dizziness.     Past Medical History:  Diagnosis Date  . Anxiety   . Bipolar and related disorder ([place])   . Blood transfusion without reported diagnosis   . Breast cancer ([place])    right lumpectemy and lymph node removal  . CHF (congestive heart failure) ([place])   . Chronic back pain   . Chronic kidney disease    "Stage IV" - states due to hypertension  . Depression   . GERD (gastroesophageal reflux disease)   . Hepatitis C   . Hypertension   . Left-sided weakness   . MRSA infection    of breast incision  . Opioid dependence ([place])    Has been treated in [facility] in past  . Substance abuse  ([place])   . TIA (transient ischemic attack) 2020   P/w BP >230/120 and neurologic symptoms, presumed TIA    Patient Active Problem List   Diagnosis Date Noted  . Chronic diastolic CHF (congestive heart failure) ([place]) 05/09/2019  . GERD (gastroesophageal reflux disease) 05/09/2019  . Alcohol abuse 05/09/2019  . Hypertension 05/08/2019  . Bilateral low back pain with bilateral sciatica 01/05/2019  . Cyst of ovary   . Pre-operative cardiovascular examination 06/13/2018  . Dizziness 02/24/2018  . Left-sided weakness 02/24/2018  . Cigarette smoker 12/31/2017  . DOE (dyspnea on exertion) 12/30/2017  . Phlebitis after infusion 10/28/2017  . Hypertensive emergency 10/21/2017  . Microcytic anemia 10/21/2017  . Medication intolerance 09/06/2017  . Neuroleptic-induced tardive dyskinesia 09/06/2017  . Cancer ([place]) 09/06/2017  . Chronic low back pain 09/06/2017  . Grief reaction with prolonged bereavement 09/06/2017  . Opioid use disorder, severe, dependence ([place]) 08/27/2017  . Alcohol use disorder, severe, dependence ([place]) 08/27/2017  . Substance induced mood disorder ([place]) 08/13/2017  . AKI (acute kidney injury) ([place])   . MDD (major depressive disorder), recurrent severe, without psychosis ([place])   . Alcohol withdrawal ([place]) 08/09/2017  . Essential hypertension 07/06/2017  . Chronic  kidney disease, stage III (moderate) 11/17/2016  . Depression with anxiety 08/21/2016  . Hx of bipolar disorder 01/31/2016  . History of breast cancer 02/13/2013  . Morbid obesity due to excess calories ([place]) 02/13/2013  . Hepatitis C virus infection cured after antiviral drug therapy 12/17/2012    Past Surgical History:  Procedure Laterality Date  . BACK SURGERY  1990  . BREAST SURGERY Right 2010   "breast cancer survivor" - states partial mastectomy and nodes  . COLONOSCOPY     [facility]  . ESOPHAGOGASTRODUODENOSCOPY     [facility]  . LAPAROSCOPIC CHOLECYSTECTOMY  2002  .  LAPAROSCOPIC INCISIONAL / UMBILICAL / VENTRAL HERNIA REPAIR  04/13/2018   with BARD 15x 36UY mesh (supraumbilical)  . LAPAROSCOPIC LYSIS OF ADHESIONS  07/12/2018   Procedure: LAPAROSCOPIC LYSIS OF ADHESIONS;  Surgeon: Isabel Caprice, MD;  Location: [facility];  Service: Gynecology;;  . MYOMECTOMY     x 2 prior to hysterectomy  . ROBOTIC ASSISTED BILATERAL SALPINGO OOPHERECTOMY Right 07/12/2018   Procedure: XI ROBOTIC ASSISTED RIGHT SALPINGO OOPHORECTOMY;  Surgeon: Isabel Caprice, MD;  Location: [facility];  Service: Gynecology;  Laterality: Right;  . TOTAL ABDOMINAL HYSTERECTOMY     fibroids   . UPPER GASTROINTESTINAL ENDOSCOPY       OB History    Gravida  2   Para  0   Term      Preterm      AB  0   Living        SAB      TAB      Ectopic      Multiple      Live Births               Home Medications    Prior to Admission medications   Medication Sig Start Date End Date Taking? Authorizing Provider  amLODipine (NORVASC) 10 MG tablet TAKE 1 TABLET (10 MG TOTAL) BY MOUTH DAILY. 05/08/19  Yes Forrest Moron, MD  aspirin 81 MG chewable tablet Chew 1 tablet (81 mg total) by mouth daily. 05/11/19  Yes Danford, Suann Larry, MD  atorvastatin (LIPITOR) 40 MG tablet Take 1 tablet (40 mg total) by mouth daily at 6 PM. 05/11/19  Yes Danford, Suann Larry, MD  ferrous sulfate 325 (65 FE) MG tablet Take 1 tablet (325 mg total) by mouth daily with breakfast. 10/27/18  Yes Stallings, Zoe A, MD  FLUoxetine (PROZAC) 40 MG capsule Take 40 mg by mouth daily.  11/09/18  Yes [provider]  furosemide (LASIX) 20 MG tablet Take 1 tablet (20 mg total) by mouth daily. 05/11/19  Yes Danford, Suann Larry, MD  gabapentin (NEURONTIN) 600 MG tablet Take 1 tablet (600 mg total) by mouth 2 (two) times daily. 05/11/19  Yes Danford, Suann Larry, MD  hydrALAZINE (APRESOLINE) 25 MG tablet Take 1 tablet (25 mg total) by mouth 3 (three) times daily. 05/11/19  Yes Danford, Suann Larry,  MD  INGREZZA 80 MG CAPS Take 80 mg by mouth daily. 10/15/17  Yes [provider]  isosorbide mononitrate (IMDUR) 60 MG 24 hr tablet TAKE 1 TABLET (60 MG TOTAL) BY MOUTH DAILY. 12/27/18  Yes Stallings, Zoe A, MD  labetalol (NORMODYNE) 200 MG tablet Take 1 tablet (200 mg total) by mouth 2 (two) times daily. 05/11/19  Yes Danford, Suann Larry, MD  Multiple Vitamins-Minerals (ADULT GUMMY) CHEW Chew 1 tablet by mouth daily. 07/21/17  Yes [provider]  pantoprazole (PROTONIX) 20  MG tablet Take 20 mg by mouth 2 (two) times daily. 05/08/19  Yes [provider]  terazosin (HYTRIN) 2 MG capsule Take 1 capsule (2 mg total) by mouth at bedtime. 10/27/18  Yes Forrest Moron, MD  HYDROcodone-acetaminophen (NORCO/VICODIN) 5-325 MG tablet Take 1-2 tablets by mouth every 6 (six) hours as needed. 06/01/19   Malvin Johns, MD  hydrOXYzine (VISTARIL) 25 MG capsule Take 25 mg by mouth 4 (four) times daily as needed for anxiety. 12/26/18   [provider]  ondansetron (ZOFRAN) 4 MG tablet Take 1 tablet (4 mg total) by mouth every 8 (eight) hours as needed for nausea or vomiting. 05/11/19   Forrest Moron, MD  varenicline (CHANTIX CONTINUING MONTH PAK) 1 MG tablet Take 1 tablet (1 mg total) by mouth 2 (two) times daily. 01/02/19   Forrest Moron, MD    Family History Family History  Problem Relation Age of Onset  . Heart attack Mother   . Breast cancer Mother 37  . Dementia Mother   . Cancer Father 41       died of bleeding from kidneys  . Colon cancer Neg Hx   . Esophageal cancer Neg Hx   . Stomach cancer Neg Hx   . Rectal cancer Neg Hx     Social History Social History   Tobacco Use  . Smoking status: Current Every Day Smoker    Packs/day: 0.25    Types: Cigarettes  . Smokeless tobacco: Never Used  . Tobacco comment: 4 cigs per day 01/2019  Substance Use Topics  . Alcohol use: No  . Drug use: Not Currently    Comment: h/o IVD use     Allergies   Abilify  [aripiprazole], Remeron [mirtazapine], Trazodone and nefazodone, Flexeril [cyclobenzaprine], and Amoxicillin   Review of Systems Review of Systems  Constitutional: Negative for fever.  Respiratory: Negative for shortness of breath.   Cardiovascular: Negative for chest pain.  Gastrointestinal: Negative for nausea and vomiting.  Genitourinary: Negative for difficulty urinating.  Musculoskeletal: Positive for back pain. Negative for arthralgias, joint swelling and neck pain.  Skin: Negative for wound.  Neurological: Negative for dizziness, weakness, light-headedness, numbness and headaches.     Physical Exam Updated Vital Signs BP (!) 186/106 (BP Location: Right Wrist)   Pulse 86   Temp 98.1 F (36.7 C) (Oral)   Resp 20   Ht 5\' 2"  (1.575 m)   Wt 111.1 kg   SpO2 100%   BMI 44.81 kg/m   Physical Exam Constitutional:      Appearance: She is well-developed.  HENT:     Head: Normocephalic and atraumatic.  Neck:     Musculoskeletal: Normal range of motion and neck supple.  Cardiovascular:     Rate and Rhythm: Normal rate.  Pulmonary:     Effort: Pulmonary effort is normal.  Musculoskeletal:        General: Tenderness present.     Comments: Positive tenderness across the lower lumbar area bilaterally.  There is no discrete spinal tenderness although it is generally tender all the way across her lower back.  Negative straight leg raise bilaterally.  Patellar reflexes are symmetric bilaterally.  She has normal sensation and motor function to lower extremities.  Pedal pulses are intact.  Skin:    General: Skin is warm and dry.  Neurological:     Mental Status: She is alert and oriented to person, place, and time.      ED Treatments / Results  Labs (  all labs ordered are listed, but only abnormal results are displayed) Labs Reviewed - No data to display  EKG None  Radiology No results found.  Procedures Procedures (including critical care time)  Medications Ordered  in ED Medications  morphine 4 MG/ML injection 6 mg (has no administration in time range)     Initial Impression / Assessment and Plan / ED Course  I have reviewed the triage vital signs and the nursing notes.  Pertinent labs & imaging results that were available during my care of the patient were reviewed by me and considered in my medical decision making (see chart for details).        Patient is a 63 year old female who presents with back pain.  She has a history of similar pain although it is been exacerbated over the last couple days.  She has no neurologic deficits or suggestions of cauda equina.  She does not have any other atypical symptoms that would be more concerning for aortic dissection or aneurysm.  No recent falls or trauma noted that would warrant imaging.  No fevers or concerns for infection.  She was given a short course of pain medication.  She was encouraged to follow-up with her PCP.  She was encouraged to use her lidocaine patches at home.  Return precautions were given.  Final Clinical Impressions(s) / ED Diagnoses   Final diagnoses:  Acute bilateral low back pain with right-sided sciatica    ED Discharge Orders         Ordered    HYDROcodone-acetaminophen (NORCO/VICODIN) 5-325 MG tablet  Every 6 hours PRN     06/01/19 0757           Malvin Johns, MD 06/01/19 [phone]

## 2019-06-01 NOTE — Telephone Encounter (Signed)
Please let the patient know that the anxiety med is called in for claustrophobia so she can take the CT

## 2019-06-01 NOTE — Telephone Encounter (Signed)
Spoke with Sherry [name] today about medication sent to her pharmacy to help her get thru her scan, she verbalized understanding.

## 2019-06-02 DIAGNOSIS — I5020 Unspecified systolic (congestive) heart failure: Secondary | ICD-10-CM | POA: Diagnosis not present

## 2019-06-02 DIAGNOSIS — I5033 Acute on chronic diastolic (congestive) heart failure: Secondary | ICD-10-CM | POA: Diagnosis not present

## 2019-06-02 DIAGNOSIS — D631 Anemia in chronic kidney disease: Secondary | ICD-10-CM | POA: Diagnosis not present

## 2019-06-02 DIAGNOSIS — M5441 Lumbago with sciatica, right side: Secondary | ICD-10-CM | POA: Diagnosis not present

## 2019-06-02 DIAGNOSIS — M5442 Lumbago with sciatica, left side: Secondary | ICD-10-CM | POA: Diagnosis not present

## 2019-06-02 DIAGNOSIS — G2401 Drug induced subacute dyskinesia: Secondary | ICD-10-CM | POA: Diagnosis not present

## 2019-06-02 DIAGNOSIS — N184 Chronic kidney disease, stage 4 (severe): Secondary | ICD-10-CM | POA: Diagnosis not present

## 2019-06-02 DIAGNOSIS — I130 Hypertensive heart and chronic kidney disease with heart failure and stage 1 through stage 4 chronic kidney disease, or unspecified chronic kidney disease: Secondary | ICD-10-CM | POA: Diagnosis not present

## 2019-06-02 DIAGNOSIS — G8929 Other chronic pain: Secondary | ICD-10-CM | POA: Diagnosis not present

## 2019-06-03 ENCOUNTER — Encounter (HOSPITAL_BASED_OUTPATIENT_CLINIC_OR_DEPARTMENT_OTHER): Payer: Self-pay | Admitting: *Deleted

## 2019-06-03 ENCOUNTER — Encounter (HOSPITAL_BASED_OUTPATIENT_CLINIC_OR_DEPARTMENT_OTHER): Payer: Self-pay | Admitting: Emergency Medicine

## 2019-06-03 ENCOUNTER — Other Ambulatory Visit: Payer: Self-pay

## 2019-06-03 ENCOUNTER — Emergency Department (HOSPITAL_BASED_OUTPATIENT_CLINIC_OR_DEPARTMENT_OTHER)
Admission: EM | Admit: 2019-06-03 | Discharge: 2019-06-03 | Disposition: A | Discharge status: Home / Self Care | Payer: Medicare HMO | Attending: Emergency Medicine | Admitting: Emergency Medicine

## 2019-06-03 DIAGNOSIS — N184 Chronic kidney disease, stage 4 (severe): Secondary | ICD-10-CM | POA: Insufficient documentation

## 2019-06-03 DIAGNOSIS — Z7982 Long term (current) use of aspirin: Secondary | ICD-10-CM | POA: Insufficient documentation

## 2019-06-03 DIAGNOSIS — R52 Pain, unspecified: Secondary | ICD-10-CM | POA: Diagnosis not present

## 2019-06-03 DIAGNOSIS — F17210 Nicotine dependence, cigarettes, uncomplicated: Secondary | ICD-10-CM | POA: Insufficient documentation

## 2019-06-03 DIAGNOSIS — N3000 Acute cystitis without hematuria: Secondary | ICD-10-CM | POA: Diagnosis not present

## 2019-06-03 DIAGNOSIS — Z8673 Personal history of transient ischemic attack (TIA), and cerebral infarction without residual deficits: Secondary | ICD-10-CM | POA: Insufficient documentation

## 2019-06-03 DIAGNOSIS — I5032 Chronic diastolic (congestive) heart failure: Secondary | ICD-10-CM | POA: Insufficient documentation

## 2019-06-03 DIAGNOSIS — N185 Chronic kidney disease, stage 5: Secondary | ICD-10-CM | POA: Insufficient documentation

## 2019-06-03 DIAGNOSIS — I132 Hypertensive heart and chronic kidney disease with heart failure and with stage 5 chronic kidney disease, or end stage renal disease: Secondary | ICD-10-CM | POA: Insufficient documentation

## 2019-06-03 DIAGNOSIS — I10 Essential (primary) hypertension: Secondary | ICD-10-CM | POA: Diagnosis not present

## 2019-06-03 DIAGNOSIS — M5489 Other dorsalgia: Secondary | ICD-10-CM | POA: Diagnosis not present

## 2019-06-03 DIAGNOSIS — Z765 Malingerer [conscious simulation]: Secondary | ICD-10-CM | POA: Diagnosis not present

## 2019-06-03 DIAGNOSIS — M5441 Lumbago with sciatica, right side: Secondary | ICD-10-CM | POA: Insufficient documentation

## 2019-06-03 DIAGNOSIS — I130 Hypertensive heart and chronic kidney disease with heart failure and stage 1 through stage 4 chronic kidney disease, or unspecified chronic kidney disease: Secondary | ICD-10-CM | POA: Insufficient documentation

## 2019-06-03 DIAGNOSIS — M5431 Sciatica, right side: Secondary | ICD-10-CM

## 2019-06-03 DIAGNOSIS — M545 Low back pain: Secondary | ICD-10-CM | POA: Insufficient documentation

## 2019-06-03 DIAGNOSIS — Z79899 Other long term (current) drug therapy: Secondary | ICD-10-CM | POA: Insufficient documentation

## 2019-06-03 DIAGNOSIS — Z853 Personal history of malignant neoplasm of breast: Secondary | ICD-10-CM | POA: Insufficient documentation

## 2019-06-03 MED ORDER — HYDROMORPHONE HCL 1 MG/ML IJ SOLN
2.0000 mg | Freq: Once | INTRAMUSCULAR | Status: AC
  Administered 2019-06-03: 2 mg via INTRAMUSCULAR
  Filled 2019-06-03: qty 2

## 2019-06-03 MED ORDER — PREDNISONE 50 MG PO TABS
60.0000 mg | ORAL_TABLET | Freq: Once | ORAL | Status: AC
  Administered 2019-06-03: 60 mg via ORAL
  Filled 2019-06-03: qty 1

## 2019-06-03 MED ORDER — PREDNISONE 10 MG PO TABS: 40.0000 mg | ORAL_TABLET | Freq: Every day | ORAL | 0 refills | Status: DC

## 2019-06-03 NOTE — ED Triage Notes (Signed)
Chronic back pain.  Was here 11.19.2020 for the same problem.

## 2019-06-03 NOTE — Discharge Instructions (Addendum)
Take prednisone as directed.  Rest is much as possible.  Make an appointment to follow-up with your primary care doctor.  If symptoms persist over the next several days MRI may be indicated.  Follow-up with your primary care doctor blood pressure rechecked.

## 2019-06-03 NOTE — ED Provider Notes (Addendum)
[facility] Provider Note   CSN: [id number] Arrival date & time: 06/03/19  1003     History   Chief Complaint Chief Complaint  Patient presents with  . Back Pain    HPI Sherry [name] is a 63 y.o. female.     Patient with complaint of low back pain for 4 days mostly only on the right side.  Seen here November 19 given hydrocodone for pain.  Patient states she now has pain radiating down the right leg to the foot but has no weakness or numbness.  No incontinence no difficulty urinating.  No left leg symptoms at all.  No fall or injury.  Patient's had a history of chronic back pain.  Also history of opiate dependence in the past.     Past Medical History:  Diagnosis Date  . Anxiety   . Bipolar and related disorder ([place])   . Blood transfusion without reported diagnosis   . Breast cancer ([place])    right lumpectemy and lymph node removal  . CHF (congestive heart failure) ([place])   . Chronic back pain   . Chronic kidney disease    "Stage IV" - states due to hypertension  . Depression   . GERD (gastroesophageal reflux disease)   . Hepatitis C   . Hypertension   . Left-sided weakness   . MRSA infection    of breast incision  . Opioid dependence ([place])    Has been treated in [facility] in past  . Substance abuse ([place])   . TIA (transient ischemic attack) 2020   P/w BP >230/120 and neurologic symptoms, presumed TIA    Patient Active Problem List   Diagnosis Date Noted  . Chronic diastolic CHF (congestive heart failure) ([employer]) 05/09/2019  . GERD (gastroesophageal reflux disease) 05/09/2019  . Alcohol abuse 05/09/2019  . Hypertension 05/08/2019  . Bilateral low back pain with bilateral sciatica 01/05/2019  . Cyst of ovary   . Pre-operative cardiovascular examination 06/13/2018  . Dizziness 02/24/2018  . Left-sided weakness 02/24/2018  . Cigarette smoker 12/31/2017  . DOE (dyspnea on exertion) 12/30/2017  . Phlebitis after infusion  10/28/2017  . Hypertensive emergency 10/21/2017  . Microcytic anemia 10/21/2017  . Medication intolerance 09/06/2017  . Neuroleptic-induced tardive dyskinesia 09/06/2017  . Cancer ([place]) 09/06/2017  . Chronic low back pain 09/06/2017  . Grief reaction with prolonged bereavement 09/06/2017  . Opioid use disorder, severe, dependence ([place]) 08/27/2017  . Alcohol use disorder, severe, dependence ([place]) 08/27/2017  . Substance induced mood disorder ([place]) 08/13/2017  . AKI (acute kidney injury) ([place])   . MDD (major depressive disorder), recurrent severe, without psychosis ([place])   . Alcohol withdrawal ([place]) 08/09/2017  . Essential hypertension 07/06/2017  . Chronic kidney disease, stage III (moderate) 11/17/2016  . Depression with anxiety 08/21/2016  . Hx of bipolar disorder 01/31/2016  . History of breast cancer 02/13/2013  . Morbid obesity due to excess calories ([place]) 02/13/2013  . Hepatitis C virus infection cured after antiviral drug therapy 12/17/2012    Past Surgical History:  Procedure Laterality Date  . BACK SURGERY  1990  . BREAST SURGERY Right 2010   "breast cancer survivor" - states partial mastectomy and nodes  . COLONOSCOPY     [facility]  . ESOPHAGOGASTRODUODENOSCOPY     [facility]  . LAPAROSCOPIC CHOLECYSTECTOMY  2002  . LAPAROSCOPIC INCISIONAL / UMBILICAL / VENTRAL HERNIA REPAIR  04/13/2018   with BARD 15x 01SW mesh (supraumbilical)  . LAPAROSCOPIC  LYSIS OF ADHESIONS  07/12/2018   Procedure: LAPAROSCOPIC LYSIS OF ADHESIONS;  Surgeon: Isabel Caprice, MD;  Location: [facility];  Service: Gynecology;;  . MYOMECTOMY     x 2 prior to hysterectomy  . ROBOTIC ASSISTED BILATERAL SALPINGO OOPHERECTOMY Right 07/12/2018   Procedure: XI ROBOTIC ASSISTED RIGHT SALPINGO OOPHORECTOMY;  Surgeon: Isabel Caprice, MD;  Location: [facility];  Service: Gynecology;  Laterality: Right;  . TOTAL ABDOMINAL HYSTERECTOMY     fibroids   . UPPER GASTROINTESTINAL ENDOSCOPY        OB History    Gravida  2   Para  0   Term      Preterm      AB  0   Living        SAB      TAB      Ectopic      Multiple      Live Births               Home Medications    Prior to Admission medications   Medication Sig Start Date End Date Taking? Authorizing Provider  amLODipine (NORVASC) 10 MG tablet TAKE 1 TABLET (10 MG TOTAL) BY MOUTH DAILY. 05/08/19   Forrest Moron, MD  aspirin 81 MG chewable tablet Chew 1 tablet (81 mg total) by mouth daily. 05/11/19   Danford, Suann Larry, MD  atorvastatin (LIPITOR) 40 MG tablet Take 1 tablet (40 mg total) by mouth daily at 6 PM. 05/11/19   Danford, Suann Larry, MD  ferrous sulfate 325 (65 FE) MG tablet Take 1 tablet (325 mg total) by mouth daily with breakfast. 10/27/18   Forrest Moron, MD  FLUoxetine (PROZAC) 40 MG capsule Take 40 mg by mouth daily.  11/09/18   [provider]  furosemide (LASIX) 20 MG tablet Take 1 tablet (20 mg total) by mouth daily. 05/11/19   Danford, Suann Larry, MD  gabapentin (NEURONTIN) 600 MG tablet Take 1 tablet (600 mg total) by mouth 2 (two) times daily. 05/11/19   Danford, Suann Larry, MD  hydrALAZINE (APRESOLINE) 25 MG tablet Take 1 tablet (25 mg total) by mouth 3 (three) times daily. 05/11/19   Danford, Suann Larry, MD  HYDROcodone-acetaminophen (NORCO/VICODIN) 5-325 MG tablet Take 1-2 tablets by mouth every 6 (six) hours as needed. 06/01/19   Malvin Johns, MD  hydrOXYzine (VISTARIL) 25 MG capsule Take 25 mg by mouth 4 (four) times daily as needed for anxiety. 12/26/18   [provider]  INGREZZA 80 MG CAPS Take 80 mg by mouth daily. 10/15/17   [provider]  isosorbide mononitrate (IMDUR) 60 MG 24 hr tablet TAKE 1 TABLET (60 MG TOTAL) BY MOUTH DAILY. 12/27/18   Forrest Moron, MD  labetalol (NORMODYNE) 200 MG tablet Take 1 tablet (200 mg total) by mouth 2 (two) times daily. 05/11/19   Danford, Suann Larry, MD  Multiple Vitamins-Minerals (ADULT  GUMMY) CHEW Chew 1 tablet by mouth daily. 07/21/17   [provider]  ondansetron (ZOFRAN) 4 MG tablet Take 1 tablet (4 mg total) by mouth every 8 (eight) hours as needed for nausea or vomiting. 05/11/19   Forrest Moron, MD  pantoprazole (PROTONIX) 20 MG tablet Take 20 mg by mouth 2 (two) times daily. 05/08/19   [provider]  predniSONE (DELTASONE) 10 MG tablet Take 4 tablets (40 mg total) by mouth daily. 06/03/19   Fredia Sorrow, MD  terazosin (HYTRIN) 2 MG capsule Take 1 capsule (2 mg total) by  mouth at bedtime. 10/27/18   Forrest Moron, MD  varenicline (CHANTIX CONTINUING MONTH PAK) 1 MG tablet Take 1 tablet (1 mg total) by mouth 2 (two) times daily. 01/02/19   Forrest Moron, MD    Family History Family History  Problem Relation Age of Onset  . Heart attack Mother   . Breast cancer Mother 51  . Dementia Mother   . Cancer Father 37       died of bleeding from kidneys  . Colon cancer Neg Hx   . Esophageal cancer Neg Hx   . Stomach cancer Neg Hx   . Rectal cancer Neg Hx     Social History Social History   Tobacco Use  . Smoking status: Current Every Day Smoker    Packs/day: 0.25    Types: Cigarettes  . Smokeless tobacco: Never Used  . Tobacco comment: 4 cigs per day 01/2019  Substance Use Topics  . Alcohol use: No  . Drug use: Not Currently    Comment: h/o IVD use     Allergies   Abilify [aripiprazole], Remeron [mirtazapine], Trazodone and nefazodone, Flexeril [cyclobenzaprine], and Amoxicillin   Review of Systems Review of Systems  Constitutional: Negative for chills and fever.  HENT: Negative for congestion, rhinorrhea and sore throat.   Eyes: Negative for visual disturbance.  Respiratory: Negative for cough and shortness of breath.   Cardiovascular: Negative for chest pain and leg swelling.  Gastrointestinal: Negative for abdominal pain, diarrhea, nausea and vomiting.  Genitourinary: Negative for dysuria.  Musculoskeletal: Positive for  back pain. Negative for neck pain.  Skin: Negative for rash.  Neurological: Negative for dizziness, light-headedness and headaches.  Hematological: Does not bruise/bleed easily.  Psychiatric/Behavioral: Negative for confusion.     Physical Exam Updated Vital Signs BP (!) 180/104 (BP Location: Right Arm)   Pulse 83   Temp 98.4 F (36.9 C) (Oral)   Resp (!) 28   Ht 1.575 m (5\' 2" )   Wt 110.3 kg   SpO2 100%   BMI 44.48 kg/m   Physical Exam Vitals signs and nursing note reviewed.  Constitutional:      General: She is not in acute distress.    Appearance: Normal appearance. She is well-developed.  HENT:     Head: Normocephalic and atraumatic.  Eyes:     Extraocular Movements: Extraocular movements intact.     Conjunctiva/sclera: Conjunctivae normal.     Pupils: Pupils are equal, round, and reactive to light.  Neck:     Musculoskeletal: Normal range of motion and neck supple.  Cardiovascular:     Rate and Rhythm: Normal rate and regular rhythm.     Heart sounds: No murmur.  Pulmonary:     Effort: Pulmonary effort is normal. No respiratory distress.     Breath sounds: Normal breath sounds.  Abdominal:     Palpations: Abdomen is soft.     Tenderness: There is no abdominal tenderness.  Musculoskeletal: Normal range of motion.        General: No swelling.     Comments: Dorsalis pedis pulse to both feet is 2+.  Right foot without any weakness or numbness.  Skin:    General: Skin is warm and dry.     Capillary Refill: Capillary refill takes less than 2 seconds.  Neurological:     General: No focal deficit present.     Mental Status: She is alert and oriented to person, place, and time.     Cranial Nerves: No cranial nerve  deficit.     Sensory: No sensory deficit.     Motor: No weakness.      ED Treatments / Results  Labs (all labs ordered are listed, but only abnormal results are displayed) Labs Reviewed - No data to display  EKG None  Radiology No results  found.  Procedures Procedures (including critical care time)  Medications Ordered in ED Medications  predniSONE (DELTASONE) tablet 60 mg (60 mg Oral Given 06/03/19 1036)  HYDROmorphone (DILAUDID) injection 2 mg (2 mg Intramuscular Given 06/03/19 1037)     Initial Impression / Assessment and Plan / ED Course  I have reviewed the triage vital signs and the nursing notes.  Pertinent labs & imaging results that were available during my care of the patient were reviewed by me and considered in my medical decision making (see chart for details).        Patient symptoms now suggestive of right-sided sciatica.  But no weakness or numbness.  Patient already had a prescription for hydrocodone.  Has a past history of opioid dependence problems.  Will dose here with prednisone 60 mg and a shot of hydromorphone.  Discharged home on prednisone 40 mg for the next 5 days.  Have her follow-up with her primary care doctor on Monday.  Also patient not showing any symptoms of cauda equina.  Or any significant motor weakness or numbness.  And no incontinence.   Patient with known history of hypertension.  Patient states she is following up with her primary care doctor.  Does have her medications.   Final Clinical Impressions(s) / ED Diagnoses   Final diagnoses:  Sciatica of right side  Essential hypertension    ED Discharge Orders         Ordered    predniSONE (DELTASONE) 10 MG tablet  Daily     06/03/19 1030           Fredia Sorrow, MD 06/03/19 1030    Fredia Sorrow, MD 06/03/19 1128

## 2019-06-03 NOTE — ED Triage Notes (Signed)
Patient states that she is having pain to her lower back - she has multiple visits for the same

## 2019-06-04 ENCOUNTER — Emergency Department (HOSPITAL_BASED_OUTPATIENT_CLINIC_OR_DEPARTMENT_OTHER): Payer: Medicare HMO

## 2019-06-04 ENCOUNTER — Encounter (HOSPITAL_BASED_OUTPATIENT_CLINIC_OR_DEPARTMENT_OTHER): Payer: Self-pay | Admitting: Emergency Medicine

## 2019-06-04 ENCOUNTER — Emergency Department (HOSPITAL_BASED_OUTPATIENT_CLINIC_OR_DEPARTMENT_OTHER)
Admission: EM | Admit: 2019-06-04 | Discharge: 2019-06-04 | Disposition: A | Discharge status: Home / Self Care | Payer: Medicare HMO | Source: Home / Self Care | Attending: Emergency Medicine | Admitting: Emergency Medicine

## 2019-06-04 DIAGNOSIS — M5441 Lumbago with sciatica, right side: Secondary | ICD-10-CM | POA: Diagnosis not present

## 2019-06-04 DIAGNOSIS — M545 Low back pain: Secondary | ICD-10-CM | POA: Diagnosis not present

## 2019-06-04 DIAGNOSIS — N184 Chronic kidney disease, stage 4 (severe): Secondary | ICD-10-CM | POA: Diagnosis not present

## 2019-06-04 DIAGNOSIS — M5430 Sciatica, unspecified side: Secondary | ICD-10-CM

## 2019-06-04 DIAGNOSIS — R030 Elevated blood-pressure reading, without diagnosis of hypertension: Secondary | ICD-10-CM | POA: Diagnosis not present

## 2019-06-04 DIAGNOSIS — I129 Hypertensive chronic kidney disease with stage 1 through stage 4 chronic kidney disease, or unspecified chronic kidney disease: Secondary | ICD-10-CM | POA: Diagnosis not present

## 2019-06-04 DIAGNOSIS — N3000 Acute cystitis without hematuria: Secondary | ICD-10-CM

## 2019-06-04 DIAGNOSIS — Z765 Malingerer [conscious simulation]: Secondary | ICD-10-CM

## 2019-06-04 LAB — URINALYSIS, ROUTINE W REFLEX MICROSCOPIC
Bilirubin Urine: NEGATIVE
Glucose, UA: NEGATIVE mg/dL
Ketones, ur: NEGATIVE mg/dL
Leukocytes,Ua: NEGATIVE
Nitrite: NEGATIVE
Protein, ur: >300 mg/dL — AB
Specific Gravity, Urine: >1.03 — ABNORMAL HIGH (ref 1.005–1.030)
pH: 5.5 (ref 5.0–8.0)

## 2019-06-04 LAB — URINALYSIS, MICROSCOPIC (REFLEX)

## 2019-06-04 MED ORDER — LIDOCAINE 5 % EX PTCH: 1.0000 | MEDICATED_PATCH | CUTANEOUS | 0 refills | Status: DC

## 2019-06-04 MED ORDER — DEXAMETHASONE SODIUM PHOSPHATE 4 MG/ML IJ SOLN
4.0000 mg | Freq: Once | INTRAMUSCULAR | Status: AC
  Administered 2019-06-04: 4 mg via INTRAMUSCULAR
  Filled 2019-06-04: qty 1

## 2019-06-04 MED ORDER — METHOCARBAMOL 500 MG PO TABS
1000.0000 mg | ORAL_TABLET | Freq: Once | ORAL | Status: AC
  Administered 2019-06-04: 01:00:00 1000 mg via ORAL
  Filled 2019-06-04: qty 2

## 2019-06-04 MED ORDER — DICLOFENAC SODIUM 1 % EX GEL: 4.0000 g | Freq: Four times a day (QID) | CUTANEOUS | 0 refills | Status: DC

## 2019-06-04 MED ORDER — HALOPERIDOL LACTATE 5 MG/ML IJ SOLN
2.0000 mg | Freq: Once | INTRAMUSCULAR | Status: AC
  Administered 2019-06-04: 01:00:00 2 mg via INTRAMUSCULAR
  Filled 2019-06-04: qty 1

## 2019-06-04 MED ORDER — SULFAMETHOXAZOLE-TRIMETHOPRIM 800-160 MG PO TABS: 1.0000 | ORAL_TABLET | Freq: Two times a day (BID) | ORAL | 0 refills | Status: AC

## 2019-06-04 NOTE — ED Provider Notes (Addendum)
[facility] EMERGENCY DEPARTMENT Provider Note   CSN: [id number] Arrival date & time: 06/03/19  2345     History   Chief Complaint Chief Complaint  Patient presents with   Back Pain    HPI Sherry [name] is a 63 y.o. female.     The history is provided by the patient. No language interpreter was used.  Back Pain Location:  Gluteal region Quality:  Cramping and shooting Radiates to:  R thigh Pain severity:  Severe Pain is:  Same all the time Onset quality:  Gradual Timing:  Constant Progression:  Unchanged Chronicity:  Chronic Context: not emotional stress and not falling   Relieved by:  Nothing Worsened by:  Nothing Ineffective treatments: narcotics in the ED and at home. Associated symptoms: no abdominal pain, no bladder incontinence, no bowel incontinence, no chest pain, no dysuria, no fever, no headaches, no numbness, no paresthesias, no pelvic pain, no perianal numbness, no tingling, no weakness and no weight loss   Risk factors: no hx of cancer and no recent surgery   Patient with chronic low back pain most ( MRI 04/06/19) and opioid use disorder presents with ongoing pain in her gluteal region. This is her 3rd visit in 48 hours for same.  She states that she has not been pain free with the IM and oral narcotics she was given and needs something stronger.    Past Medical History:  Diagnosis Date   Anxiety    Bipolar and related disorder ([place])    Blood transfusion without reported diagnosis    Breast cancer ([place])    right lumpectemy and lymph node removal   CHF (congestive heart failure) ([place])    Chronic back pain    Chronic kidney disease    "Stage IV" - states due to hypertension   Depression    GERD (gastroesophageal reflux disease)    Hepatitis C    Hypertension    Left-sided weakness    MRSA infection    of breast incision   Opioid dependence ([place])    Has been treated in [place] in past   Substance abuse ([place])    TIA  (transient ischemic attack) 2020   P/w BP >230/120 and neurologic symptoms, presumed TIA    Patient Active Problem List   Diagnosis Date Noted   Chronic diastolic CHF (congestive heart failure) ([place]) 05/09/2019   GERD (gastroesophageal reflux disease) 05/09/2019   Alcohol abuse 05/09/2019   Hypertension 05/08/2019   Bilateral low back pain with bilateral sciatica 01/05/2019   Cyst of ovary    Pre-operative cardiovascular examination 06/13/2018   Dizziness 02/24/2018   Left-sided weakness 02/24/2018   Cigarette smoker 12/31/2017   DOE (dyspnea on exertion) 12/30/2017   Phlebitis after infusion 10/28/2017   Hypertensive emergency 10/21/2017   Microcytic anemia 10/21/2017   Medication intolerance 09/06/2017   Neuroleptic-induced tardive dyskinesia 09/06/2017   Cancer ([place]) 09/06/2017   Chronic low back pain 09/06/2017   Grief reaction with prolonged bereavement 09/06/2017   Opioid use disorder, severe, dependence ([place]) 08/27/2017   Alcohol use disorder, severe, dependence ([place]) 08/27/2017   Substance induced mood disorder ([place]) 08/13/2017   AKI (acute kidney injury) ([place])    MDD (major depressive disorder), recurrent severe, without psychosis ([place])    Alcohol withdrawal ([place]) 08/09/2017   Essential hypertension 07/06/2017   Chronic kidney disease, stage III (moderate) 11/17/2016   Depression with anxiety 08/21/2016   Hx of bipolar disorder 01/31/2016   History of breast cancer 02/13/2013  Morbid obesity due to excess calories ([place]) 02/13/2013   Hepatitis C virus infection cured after antiviral drug therapy 12/17/2012    Past Surgical History:  Procedure Laterality Date   BACK SURGERY  1990   BREAST SURGERY Right 2010   "breast cancer survivor" - states partial mastectomy and nodes   COLONOSCOPY     [facility]   ESOPHAGOGASTRODUODENOSCOPY     [facility]   LAPAROSCOPIC CHOLECYSTECTOMY  2002   LAPAROSCOPIC INCISIONAL  / UMBILICAL / [place]  04/13/2018   with BARD 15x 59FM mesh (supraumbilical)   LAPAROSCOPIC LYSIS OF ADHESIONS  07/12/2018   Procedure: LAPAROSCOPIC LYSIS OF ADHESIONS;  Surgeon: Isabel Caprice, MD;  Location: [facility];  Service: Gynecology;;   MYOMECTOMY     x 2 prior to hysterectomy   ROBOTIC ASSISTED BILATERAL SALPINGO OOPHERECTOMY Right 07/12/2018   Procedure: XI ROBOTIC ASSISTED RIGHT SALPINGO OOPHORECTOMY;  Surgeon: Isabel Caprice, MD;  Location: [facility];  Service: Gynecology;  Laterality: Right;   TOTAL ABDOMINAL HYSTERECTOMY     fibroids    UPPER GASTROINTESTINAL ENDOSCOPY       OB History    Gravida  2   Para  0   Term      Preterm      AB  0   Living        SAB      TAB      Ectopic      Multiple      Live Births               Home Medications    Prior to Admission medications   Medication Sig Start Date End Date Taking? Authorizing Provider  amLODipine (NORVASC) 10 MG tablet TAKE 1 TABLET (10 MG TOTAL) BY MOUTH DAILY. 05/08/19  Yes Forrest Moron, MD  aspirin 81 MG chewable tablet Chew 1 tablet (81 mg total) by mouth daily. 05/11/19  Yes Danford, Suann Larry, MD  atorvastatin (LIPITOR) 40 MG tablet Take 1 tablet (40 mg total) by mouth daily at 6 PM. 05/11/19  Yes Danford, Suann Larry, MD  ferrous sulfate 325 (65 FE) MG tablet Take 1 tablet (325 mg total) by mouth daily with breakfast. 10/27/18  Yes Stallings, Zoe A, MD  FLUoxetine (PROZAC) 40 MG capsule Take 40 mg by mouth daily.  11/09/18  Yes [provider]  furosemide (LASIX) 20 MG tablet Take 1 tablet (20 mg total) by mouth daily. 05/11/19  Yes Danford, Suann Larry, MD  gabapentin (NEURONTIN) 600 MG tablet Take 1 tablet (600 mg total) by mouth 2 (two) times daily. 05/11/19  Yes Danford, Suann Larry, MD  hydrALAZINE (APRESOLINE) 25 MG tablet Take 1 tablet (25 mg total) by mouth 3 (three) times daily. 05/11/19  Yes Danford, Suann Larry, MD    HYDROcodone-acetaminophen (NORCO/VICODIN) 5-325 MG tablet Take 1-2 tablets by mouth every 6 (six) hours as needed. 06/01/19  Yes Malvin Johns, MD  hydrOXYzine (VISTARIL) 25 MG capsule Take 25 mg by mouth 4 (four) times daily as needed for anxiety. 12/26/18  Yes [provider]  INGREZZA 80 MG CAPS Take 80 mg by mouth daily. 10/15/17  Yes [provider]  isosorbide mononitrate (IMDUR) 60 MG 24 hr tablet TAKE 1 TABLET (60 MG TOTAL) BY MOUTH DAILY. 12/27/18  Yes Stallings, Zoe A, MD  labetalol (NORMODYNE) 200 MG tablet Take 1 tablet (200 mg total) by mouth 2 (two) times daily. 05/11/19  Yes Danford, Suann Larry, MD  Multiple Vitamins-Minerals (ADULT GUMMY) CHEW Chew 1 tablet by mouth daily. 07/21/17  Yes [provider]  ondansetron (ZOFRAN) 4 MG tablet Take 1 tablet (4 mg total) by mouth every 8 (eight) hours as needed for nausea or vomiting. 05/11/19  Yes Stallings, Zoe A, MD  pantoprazole (PROTONIX) 20 MG tablet Take 20 mg by mouth 2 (two) times daily. 05/08/19  Yes [provider]  predniSONE (DELTASONE) 10 MG tablet Take 4 tablets (40 mg total) by mouth daily. 06/03/19  Yes Fredia Sorrow, MD  terazosin (HYTRIN) 2 MG capsule Take 1 capsule (2 mg total) by mouth at bedtime. 10/27/18  Yes Stallings, Zoe A, MD  diclofenac Sodium (VOLTAREN) 1 % GEL Apply 4 g topically 4 (four) times daily. 06/04/19   Maryann Mccall, MD  lidocaine (LIDODERM) 5 % Place 1 patch onto the skin daily. Remove & Discard patch within 12 hours or as directed by MD 06/04/19   Randal Buba, Javeria Briski, MD  varenicline (CHANTIX CONTINUING MONTH PAK) 1 MG tablet Take 1 tablet (1 mg total) by mouth 2 (two) times daily. 01/02/19   Forrest Moron, MD    Family History Family History  Problem Relation Age of Onset   Heart attack Mother    Breast cancer Mother 47   Dementia Mother    Cancer Father 44       died of bleeding from kidneys   Colon cancer Neg Hx    Esophageal cancer Neg Hx     Stomach cancer Neg Hx    Rectal cancer Neg Hx     Social History Social History   Tobacco Use   Smoking status: Current Every Day Smoker    Packs/day: 0.25    Types: Cigarettes   Smokeless tobacco: Never Used   Tobacco comment: 4 cigs per day 01/2019  Substance Use Topics   Alcohol use: No   Drug use: Not Currently    Comment: h/o IVD use     Allergies   Abilify [aripiprazole], Remeron [mirtazapine], Trazodone and nefazodone, Flexeril [cyclobenzaprine], and Amoxicillin   Review of Systems Review of Systems  Constitutional: Negative for fever and weight loss.  HENT: Negative for congestion.   Eyes: Negative for visual disturbance.  Respiratory: Negative for cough.   Cardiovascular: Negative for chest pain.  Gastrointestinal: Negative for abdominal pain and bowel incontinence.  Genitourinary: Negative for bladder incontinence, difficulty urinating, dysuria, pelvic pain and urgency.  Musculoskeletal: Positive for back pain.  Neurological: Negative for tingling, weakness, numbness, headaches and paresthesias.  Psychiatric/Behavioral: Negative for agitation.  All other systems reviewed and are negative.    Physical Exam Updated Vital Signs BP (!) 184/89 (BP Location: Right Arm)    Pulse 76    Temp 98.5 F (36.9 C) (Oral)    Resp 20    Ht 5\' 2"  (1.575 m)    Wt 111.1 kg    SpO2 95%    BMI 44.81 kg/m   Physical Exam Vitals signs and nursing note reviewed.  Constitutional:      General: She is not in acute distress.    Appearance: Normal appearance. She is not ill-appearing.  HENT:     Head: Normocephalic and atraumatic.     Nose: Nose normal.  Eyes:     Conjunctiva/sclera: Conjunctivae normal.     Pupils: Pupils are equal, round, and reactive to light.  Neck:     Musculoskeletal: Normal range of motion and neck supple.  Cardiovascular:     Rate and Rhythm: Normal rate and  regular rhythm.     Pulses: Normal pulses.     Heart sounds: Normal heart sounds.    Pulmonary:     Effort: Pulmonary effort is normal.     Breath sounds: Normal breath sounds.  Abdominal:     General: Abdomen is flat. Bowel sounds are normal.     Tenderness: There is no abdominal tenderness. There is no guarding or rebound.  Musculoskeletal: Normal range of motion.        General: No swelling.     Right hip: Normal.     Left hip: Normal.     Thoracic back: Normal.     Lumbar back: Normal.  Skin:    General: Skin is warm and dry.     Capillary Refill: Capillary refill takes less than 2 seconds.  Neurological:     General: No focal deficit present.     Mental Status: She is alert and oriented to person, place, and time.     Deep Tendon Reflexes: Reflexes normal.  Psychiatric:        Mood and Affect: Mood normal.        Behavior: Behavior normal.      ED Treatments / Results  Labs (all labs ordered are listed, but only abnormal results are displayed) Results for orders placed or performed during the hospital encounter of 06/04/19  Urinalysis, Routine w reflex microscopic  Result Value Ref Range   Color, Urine YELLOW YELLOW   APPearance HAZY (A) CLEAR   Specific Gravity, Urine >1.030 (H) 1.005 - 1.030   pH 5.5 5.0 - 8.0   Glucose, UA NEGATIVE NEGATIVE mg/dL   Hgb urine dipstick TRACE (A) NEGATIVE   Bilirubin Urine NEGATIVE NEGATIVE   Ketones, ur NEGATIVE NEGATIVE mg/dL   Protein, ur >300 (A) NEGATIVE mg/dL   Nitrite NEGATIVE NEGATIVE   Leukocytes,Ua NEGATIVE NEGATIVE  Urinalysis, Microscopic (reflex)  Result Value Ref Range   RBC / HPF 0-5 0 - 5 RBC/hpf   WBC, UA 0-5 0 - 5 WBC/hpf   Bacteria, UA MANY (A) NONE SEEN   Squamous Epithelial / LPF 6-10 0 - 5   Granular Casts, UA PRESENT    Dg Chest 2 View  Result Date: 05/08/2019 CLINICAL DATA:  Back pain and hypertension EXAM: CHEST - 2 VIEW COMPARISON:  04/25/2019 FINDINGS: Heart size is enlarged. There is atelectasis at the lung bases. The lung volumes are somewhat low. There is no pneumothorax. No  large pleural effusion. There is some mild vascular congestion. IMPRESSION: Cardiomegaly with mild vascular congestion. Electronically Signed   By: Constance Holster M.D.   On: 05/08/2019 18:15   Dg Lumbar Spine Complete  Result Date: 06/04/2019 CLINICAL DATA:  Pain for 2 days radiating down right leg. No known injury. EXAM: LUMBAR SPINE - COMPLETE 4+ VIEW COMPARISON:  Lumbar spine MRI 04/06/2019 FINDINGS: The alignment is maintained. Vertebral body heights are normal. There is no listhesis. The posterior elements are intact. Disc space narrowing at L5-S1 with endplate spurring. Lesser disc space narrowing and endplate spurring at H8-I6. Multilevel facet hypertrophy throughout the lumbar spine. No fracture. Sacroiliac joints are congruent. Tacks projecting over the abdomen likely from prior hernia repair. IMPRESSION: Degenerative disc disease and facet arthropathy in the lumbar spine. No acute bony abnormality. Electronically Signed   By: Keith Rake M.D.   On: 06/04/2019 00:56   Dg Abd 1 View  Result Date: 05/10/2019 CLINICAL DATA:  Diffuse abdominal pain EXAM: ABDOMEN - 1 VIEW COMPARISON:  12/31/2018 FINDINGS: Prior  cholecystectomy. Moderate stool burden throughout the colon. No bowel obstruction or free air. No organomegaly or suspicious calcification. IMPRESSION: Moderate stool burden.  No acute findings. Electronically Signed   By: Rolm Baptise M.D.   On: 05/10/2019 11:15   Ct Head Wo Contrast  Result Date: 05/08/2019 CLINICAL DATA:  Headache, LEFT-sided numbness intermittently, history uncontrolled hypertension, CHF, advanced renal insufficiency, breast cancer EXAM: CT HEAD WITHOUT CONTRAST TECHNIQUE: Contiguous axial images were obtained from the base of the skull through the vertex without intravenous contrast. Sagittal and coronal MPR images reconstructed from axial data set. COMPARISON:  09/03/2018 FINDINGS: Brain: Normal ventricular morphology. No midline shift or mass effect. Small  vessel chronic ischemic changes of deep cerebral white matter. No intracranial hemorrhage, mass lesion, or evidence of acute infarction. No extra-axial fluid collections. Vascular: No hyperdense vessels. Atherosclerotic calcification of internal carotid arteries at skull base. Skull: Intact Sinuses/Orbits: Clear Other: N/A IMPRESSION: Small vessel chronic ischemic changes of deep cerebral white matter. No acute intracranial abnormalities. Electronically Signed   By: Lavonia Dana M.D.   On: 05/08/2019 18:03   Mr Angio Head Wo Contrast  Result Date: 05/09/2019 CLINICAL DATA:  TIA, initial exam. Headache with left-sided numbness intermittently EXAM: MRI HEAD WITHOUT CONTRAST MRA HEAD WITHOUT CONTRAST TECHNIQUE: Multiplanar, multiecho pulse sequences of the brain and surrounding structures were obtained without intravenous contrast. Angiographic images of the head were obtained using MRA technique without contrast. COMPARISON:  04/06/2019 FINDINGS: MRI HEAD FINDINGS Brain: No acute infarction, hemorrhage, hydrocephalus, extra-axial collection or mass lesion. FLAIR hyperintensity in the periventricular white matter and to a mild extent in the pons, stable from comparison and likely from chronic small vessel ischemia. Age normal brain volume Vascular: Arterial findings below. Normal dural venous sinus flow voids Skull and upper cervical spine: Voids negative for marrow lesion. Partial visualization of an ACDF plate Sinuses/Orbits: Negative Other: Motion degraded MRA HEAD FINDINGS Vertebral, basilar, and carotid arteries are patent. There is mild irregularity and narrowing at the bilateral anterior genu of the ICA, likely from flow artifact and motion. Extensive medium size vessel irregularity including high-grade bilateral M2 segment stenoses and high-grade right P1 segment stenosis. This is likely atherosclerosis accentuated by motion. No definite poststenotic beading to imply a vasculitis in this patient with  headache. Fetal type left PCA. Negative for aneurysm or evident vascular malformation IMPRESSION: Brain MRI: 1. No acute finding or change from last month. 2. Mild presumed chronic small vessel ischemia. 3. Motion degraded. Intracranial MRA: Extensive irregularity of medium size vessels, likely advanced intracranial atherosclerosis. The narrowings are not associated with beading to strongly implicate a vasculitis/vasculopathy in this patient with history of headache. The most notable high-grade narrowings involve bilateral M2 branches in the right P1 segment. Electronically Signed   By: Monte Fantasia M.D.   On: 05/09/2019 09:14   Mr Brain Wo Contrast  Result Date: 05/09/2019 CLINICAL DATA:  TIA, initial exam. Headache with left-sided numbness intermittently EXAM: MRI HEAD WITHOUT CONTRAST MRA HEAD WITHOUT CONTRAST TECHNIQUE: Multiplanar, multiecho pulse sequences of the brain and surrounding structures were obtained without intravenous contrast. Angiographic images of the head were obtained using MRA technique without contrast. COMPARISON:  04/06/2019 FINDINGS: MRI HEAD FINDINGS Brain: No acute infarction, hemorrhage, hydrocephalus, extra-axial collection or mass lesion. FLAIR hyperintensity in the periventricular white matter and to a mild extent in the pons, stable from comparison and likely from chronic small vessel ischemia. Age normal brain volume Vascular: Arterial findings below. Normal dural venous sinus flow voids Skull and  upper cervical spine: Voids negative for marrow lesion. Partial visualization of an ACDF plate Sinuses/Orbits: Negative Other: Motion degraded MRA HEAD FINDINGS Vertebral, basilar, and carotid arteries are patent. There is mild irregularity and narrowing at the bilateral anterior genu of the ICA, likely from flow artifact and motion. Extensive medium size vessel irregularity including high-grade bilateral M2 segment stenoses and high-grade right P1 segment stenosis. This is likely  atherosclerosis accentuated by motion. No definite poststenotic beading to imply a vasculitis in this patient with headache. Fetal type left PCA. Negative for aneurysm or evident vascular malformation IMPRESSION: Brain MRI: 1. No acute finding or change from last month. 2. Mild presumed chronic small vessel ischemia. 3. Motion degraded. Intracranial MRA: Extensive irregularity of medium size vessels, likely advanced intracranial atherosclerosis. The narrowings are not associated with beading to strongly implicate a vasculitis/vasculopathy in this patient with history of headache. The most notable high-grade narrowings involve bilateral M2 branches in the right P1 segment. Electronically Signed   By: Monte Fantasia M.D.   On: 05/09/2019 09:14   Vas US Carotid (at [facility] Only)  Result Date: 05/09/2019 Carotid Arterial Duplex Study Indications:       TIA. Risk Factors:      Hypertension. Limitations        Today's exam was limited due to the body habitus of the                    patient, the high bifurcation of the carotid and the                    patient's respiratory variation. Comparison Study:  No prior study. Performing Technologist: Maudry Mayhew MHA, RDMS, RVT, RDCS  Examination Guidelines: A complete evaluation includes B-mode imaging, spectral Doppler, color Doppler, and power Doppler as needed of all accessible portions of each vessel. Bilateral testing is considered an integral part of a complete examination. Limited examinations for reoccurring indications may be performed as noted.  Right Carotid Findings: +----------+--------+--------+--------+-----------------------+--------+             PSV cm/s EDV cm/s Stenosis Plaque Description      Comments  +----------+--------+--------+--------+-----------------------+--------+  CCA Prox   112      15                smooth and homogeneous            +----------+--------+--------+--------+-----------------------+--------+  CCA Distal 65       14                 smooth and homogeneous            +----------+--------+--------+--------+-----------------------+--------+  ICA Prox   81       22                smooth and heterogenous           +----------+--------+--------+--------+-----------------------+--------+  ICA Distal 65       27                                                  +----------+--------+--------+--------+-----------------------+--------+  ECA        93       11                                                  +----------+--------+--------+--------+-----------------------+--------+ +----------+--------+-------+----------------+-------------------+  PSV cm/s EDV cms Describe         Arm Pressure (mmHG)  +----------+--------+-------+----------------+-------------------+  Subclavian 184              Multiphasic, WNL                      +----------+--------+-------+----------------+-------------------+ +---------+--------+--+--------+--+---------+  Vertebral PSV cm/s 52 EDV cm/s 21 Antegrade  +---------+--------+--+--------+--+---------+  Left Carotid Findings: +----------+--------+--------+--------+------------------+--------+             PSV cm/s EDV cm/s Stenosis Plaque Description Comments  +----------+--------+--------+--------+------------------+--------+  CCA Prox   120      20                                             +----------+--------+--------+--------+------------------+--------+  CCA Distal 98       26                                             +----------+--------+--------+--------+------------------+--------+  ICA Prox   117      32                                             +----------+--------+--------+--------+------------------+--------+  ICA Distal 75       24                                             +----------+--------+--------+--------+------------------+--------+  ECA        161      21                                             +----------+--------+--------+--------+------------------+--------+  +----------+--------+--------+----------------+-------------------+             PSV cm/s EDV cm/s Describe         Arm Pressure (mmHG)  +----------+--------+--------+----------------+-------------------+  Subclavian 139               Multiphasic, WNL                      +----------+--------+--------+----------------+-------------------+ +---------+--------+--+--------+--+---------+  Vertebral PSV cm/s 55 EDV cm/s 19 Antegrade  +---------+--------+--+--------+--+---------+  Summary: Right Carotid: Velocities in the right ICA are consistent with a 1-39% stenosis. Left Carotid: Velocities in the left ICA are consistent with a 1-39% stenosis. Vertebrals:  Bilateral vertebral arteries demonstrate antegrade flow. Subclavians: Normal flow hemodynamics were seen in bilateral subclavian              arteries. *See table(s) above for measurements and observations.  Electronically signed by Deitra Mayo MD on 05/09/2019 at 3:08:44 PM.    Final     Radiology Dg Lumbar Spine Complete  Result Date: 06/04/2019 CLINICAL DATA:  Pain for 2 days radiating down right leg. No known injury. EXAM: LUMBAR SPINE - COMPLETE 4+ VIEW COMPARISON:  Lumbar spine MRI 04/06/2019 FINDINGS: The alignment is maintained. Vertebral body heights are  normal. There is no listhesis. The posterior elements are intact. Disc space narrowing at L5-S1 with endplate spurring. Lesser disc space narrowing and endplate spurring at Z6-S0. Multilevel facet hypertrophy throughout the lumbar spine. No fracture. Sacroiliac joints are congruent. Tacks projecting over the abdomen likely from prior hernia repair. IMPRESSION: Degenerative disc disease and facet arthropathy in the lumbar spine. No acute bony abnormality. Electronically Signed   By: Keith Rake M.D.   On: 06/04/2019 00:56    Procedures Procedures (including critical care time)  Medications Ordered in ED Medications  dexamethasone (DECADRON) injection 4 mg (4 mg Intramuscular Given  06/04/19 0106)  methocarbamol (ROBAXIN) tablet 1,000 mg (1,000 mg Oral Given 06/04/19 0104)  haloperidol lactate (HALDOL) injection 2 mg (2 mg Intramuscular Given 06/04/19 0117)     Initial Impression / Assessment and Plan / ED Course  Patient was given IM and a prescription for oral narcotics.  I suspect she has taken all the narcotics she was prescribed.  I will not be giving more narcotics as the patient has an opioid use disorder and this is drug seeking.  I have given robaxin, steroids and haldol to treat for chronic pain in the ED.  I will treat for UTI with rx for Bactrim as this may help the pain.  She also has hard stool seen by me on the Xray and the recent narcotics will exacerbate this and I have advised Miralax therapy.  There are no acute fractures.  L5/s1 are intact and the patient is voiding on own without difficulty.  I do not feel the patient needs an emergent MRI at this time.  I will refer to sports medicine for rehab and ongoing treatment.  With nurse present I explained to the patient that she will not be painfree immediately as she has ongoing issues with her back.  She is instructed to follow up with sports medicine and her family physician.     Marrisa Kimber was evaluated in Emergency Department on 06/04/2019 for the symptoms described in the history of present illness. She was evaluated in the context of the global COVID-19 pandemic, which necessitated consideration that the patient might be at risk for infection with the SARS-CoV-2 virus that causes COVID-19. Institutional protocols and algorithms that pertain to the evaluation of patients at risk for COVID-19 are in a state of rapid change based on information released by regulatory bodies including the CDC and federal and state organizations. These policies and algorithms were followed during the patient's care in the ED.  Final Clinical Impressions(s) / ED Diagnoses   Return for intractable cough, coughing up  blood,fevers >100.4 unrelieved by medication, shortness of breath, intractable vomiting, chest pain, shortness of breath, weakness,numbness, changes in speech, facial asymmetry,abdominal pain, passing out,Inability to tolerate liquids or food, cough, altered mental status or any concerns. No signs of systemic illness or infection. The patient is nontoxic-appearing on exam and vital signs are within normal limits.   I have reviewed the triage vital signs and the nursing notes. Pertinent labs &imaging results that were available during my care of the patient were reviewed by me and considered in my medical decision making (see chart for details).  After history, exam, and medical workup I feel the patient has been appropriately medically screened and is safe for discharge home. Pertinent diagnoses were discussed with the patient. Patient was given return precautions       Abbegale Stehle, MD 06/04/19 0155    Avontae Burkhead, MD 06/04/19 6301

## 2019-06-05 ENCOUNTER — Telehealth: Payer: Self-pay | Admitting: Family Medicine

## 2019-06-05 NOTE — Telephone Encounter (Signed)
gabapentin (NEURONTIN) 600 MG tablet [[id number]]   [id number] - [place], [place] - [address]  Out medication can't get until Friday unless provider sends approval  Please advise

## 2019-06-06 DIAGNOSIS — M5442 Lumbago with sciatica, left side: Secondary | ICD-10-CM | POA: Diagnosis not present

## 2019-06-06 DIAGNOSIS — D631 Anemia in chronic kidney disease: Secondary | ICD-10-CM | POA: Diagnosis not present

## 2019-06-06 DIAGNOSIS — I130 Hypertensive heart and chronic kidney disease with heart failure and stage 1 through stage 4 chronic kidney disease, or unspecified chronic kidney disease: Secondary | ICD-10-CM | POA: Diagnosis not present

## 2019-06-06 DIAGNOSIS — M5441 Lumbago with sciatica, right side: Secondary | ICD-10-CM | POA: Diagnosis not present

## 2019-06-06 DIAGNOSIS — G8929 Other chronic pain: Secondary | ICD-10-CM | POA: Diagnosis not present

## 2019-06-06 DIAGNOSIS — I5033 Acute on chronic diastolic (congestive) heart failure: Secondary | ICD-10-CM | POA: Diagnosis not present

## 2019-06-06 DIAGNOSIS — I5020 Unspecified systolic (congestive) heart failure: Secondary | ICD-10-CM | POA: Diagnosis not present

## 2019-06-06 DIAGNOSIS — G2401 Drug induced subacute dyskinesia: Secondary | ICD-10-CM | POA: Diagnosis not present

## 2019-06-06 DIAGNOSIS — N184 Chronic kidney disease, stage 4 (severe): Secondary | ICD-10-CM | POA: Diagnosis not present

## 2019-06-06 MED ORDER — GABAPENTIN 600 MG PO TABS: 600.0000 mg | ORAL_TABLET | Freq: Two times a day (BID) | ORAL | 1 refills | Status: DC

## 2019-06-14 DIAGNOSIS — D631 Anemia in chronic kidney disease: Secondary | ICD-10-CM | POA: Diagnosis not present

## 2019-06-14 DIAGNOSIS — I5033 Acute on chronic diastolic (congestive) heart failure: Secondary | ICD-10-CM | POA: Diagnosis not present

## 2019-06-14 DIAGNOSIS — I130 Hypertensive heart and chronic kidney disease with heart failure and stage 1 through stage 4 chronic kidney disease, or unspecified chronic kidney disease: Secondary | ICD-10-CM | POA: Diagnosis not present

## 2019-06-14 DIAGNOSIS — N184 Chronic kidney disease, stage 4 (severe): Secondary | ICD-10-CM | POA: Diagnosis not present

## 2019-06-14 DIAGNOSIS — M5442 Lumbago with sciatica, left side: Secondary | ICD-10-CM | POA: Diagnosis not present

## 2019-06-14 DIAGNOSIS — I5020 Unspecified systolic (congestive) heart failure: Secondary | ICD-10-CM | POA: Diagnosis not present

## 2019-06-14 DIAGNOSIS — G2401 Drug induced subacute dyskinesia: Secondary | ICD-10-CM | POA: Diagnosis not present

## 2019-06-14 DIAGNOSIS — G8929 Other chronic pain: Secondary | ICD-10-CM | POA: Diagnosis not present

## 2019-06-14 DIAGNOSIS — M5441 Lumbago with sciatica, right side: Secondary | ICD-10-CM | POA: Diagnosis not present

## 2019-06-19 DIAGNOSIS — R6889 Other general symptoms and signs: Secondary | ICD-10-CM | POA: Diagnosis not present

## 2019-06-20 ENCOUNTER — Ambulatory Visit (HOSPITAL_BASED_OUTPATIENT_CLINIC_OR_DEPARTMENT_OTHER): Admission: RE | Admit: 2019-06-20 | Payer: Medicare HMO | Source: Ambulatory Visit

## 2019-06-22 DIAGNOSIS — R6889 Other general symptoms and signs: Secondary | ICD-10-CM | POA: Diagnosis not present

## 2019-06-23 ENCOUNTER — Other Ambulatory Visit: Payer: Self-pay | Admitting: Family Medicine

## 2019-06-23 NOTE — Telephone Encounter (Signed)
Pt requesting her medication refill for acid reflux. She states pharmacy has already sent a fax and they are sending another. Please advise

## 2019-06-23 NOTE — Telephone Encounter (Signed)
Requested medication (s) are due for refill today: yes  Requested medication (s) are on the active medication list: yes  Last refill:  05/08/2019  Future visit scheduled: yes  Notes to clinic:  last filled by historical provider  Review for refill   Requested Prescriptions  Pending Prescriptions Disp Refills   pantoprazole (PROTONIX) 20 MG tablet [Pharmacy Med Name: PANTOPRAZOLE 20MG  TAB] 60 tablet     Sig: TAKE 1 TABLET BY MOUTH TWICE A DAY      Gastroenterology: Proton Pump Inhibitors Passed - 06/23/2019 10:37 AM      Passed - Valid encounter within last 12 months    Recent Outpatient Visits           2 months ago Medically complex patient   Primary Care at [place], [place]   3 months ago Essential hypertension   [facility] at [facility], Lilia Argue, MD   4 months ago Falls frequently   Primary Care at [facility], Arlie Solomons, MD   5 months ago Abnormality of gait   Primary Care at [facility], [place], MD   6 months ago At high risk for falls   Primary Care at [place], [place]       Future Appointments             In 5 days Forrest Moron, MD Primary Care at [place], [facility]

## 2019-06-26 LAB — HM DIABETES EYE EXAM

## 2019-06-27 DIAGNOSIS — R6889 Other general symptoms and signs: Secondary | ICD-10-CM | POA: Diagnosis not present

## 2019-06-28 ENCOUNTER — Other Ambulatory Visit: Payer: Self-pay

## 2019-06-28 ENCOUNTER — Telehealth: Payer: Self-pay | Admitting: Family Medicine

## 2019-06-28 ENCOUNTER — Telehealth (INDEPENDENT_AMBULATORY_CARE_PROVIDER_SITE_OTHER): Payer: Medicare HMO | Admitting: Family Medicine

## 2019-06-28 ENCOUNTER — Encounter: Payer: Self-pay | Admitting: Family Medicine

## 2019-06-28 MED ORDER — GABAPENTIN 800 MG PO TABS: 800.0000 mg | ORAL_TABLET | Freq: Two times a day (BID) | ORAL | 1 refills | Status: DC

## 2019-06-28 NOTE — Telephone Encounter (Signed)
Please advise 

## 2019-06-28 NOTE — Telephone Encounter (Signed)
Requesting muscle relaxer medication that dr.stallings did not want her to take because   bp medication was stopping that   Please advise

## 2019-06-28 NOTE — Progress Notes (Signed)
  Did not talk to patient

## 2019-06-28 NOTE — Patient Instructions (Signed)
° ° ° °  If you have lab work done today you will be contacted with your lab results within the next 2 weeks.  If you have not heard from us then please contact us. The fastest way to get your results is to register for My Chart. ° ° °IF you received an x-ray today, you will receive an invoice from [facility]. Please contact  Radiology at [phone] with questions or concerns regarding your invoice.  ° °IF you received labwork today, you will receive an invoice from [facility]. Please contact [facility] at [phone] with questions or concerns regarding your invoice.  ° °Our billing staff will not be able to assist you with questions regarding bills from these companies. ° °You will be contacted with the lab results as soon as they are available. The fastest way to get your results is to activate your My Chart account. Instructions are located on the last page of this paperwork. If you have not heard from us regarding the results in 2 weeks, please contact this office. °  ° ° ° °

## 2019-06-28 NOTE — Progress Notes (Signed)
CC:  F/u from cardiologist.  Per pt wants to make sure dr. Nolon Rod got the report from the heart doctor.  Also she needs refills on atorvastatin, furosemide, gabapentin change from 600 mg to 800 mg, and isosorbide mononitrate.  No travel outside the [place] or [place] in the past 3 weeks.

## 2019-06-29 ENCOUNTER — Telehealth: Payer: Self-pay | Admitting: Family Medicine

## 2019-06-29 ENCOUNTER — Other Ambulatory Visit: Payer: Self-pay

## 2019-06-29 DIAGNOSIS — R6889 Other general symptoms and signs: Secondary | ICD-10-CM | POA: Diagnosis not present

## 2019-06-29 NOTE — Telephone Encounter (Signed)
Copied from [place] [phone]. Topic: General - Other >> Jun 29, 2019  8:34 AM Rainey Pines A wrote: Patient would like a callback from nurse in regards to wanting to know why she is taking Gabapentin medication twice a day rather than 3 times a day because it is not comforting her

## 2019-06-29 NOTE — Telephone Encounter (Signed)
Please advise why pt is now taking 1 tab twice daily and instead of 3 times and I will call and explain. Thanks  [employer]

## 2019-06-29 NOTE — Telephone Encounter (Signed)
Copied from [place] [phone]. Topic: General - Other >> Jun 29, 2019  8:34 AM Rainey Pines A wrote: Patient would like a callback from nurse in regards to wanting to know why she is taking Gabapentin medication twice a day rather than 3 times a day because it is not comforting her

## 2019-06-30 ENCOUNTER — Encounter: Payer: Self-pay | Admitting: *Deleted

## 2019-06-30 NOTE — Telephone Encounter (Signed)
Pt called again and is requesting to have an answer. Please advise.

## 2019-07-05 ENCOUNTER — Other Ambulatory Visit: Payer: Self-pay | Admitting: Family Medicine

## 2019-07-05 MED ORDER — GABAPENTIN 800 MG PO TABS: 800.0000 mg | ORAL_TABLET | Freq: Three times a day (TID) | ORAL | 1 refills | Status: DC

## 2019-07-05 NOTE — Telephone Encounter (Signed)
New medication as been sent to the pharm. Msg was left on patient machine that rx has been sent in for the corrected amount 800mg  3x daily

## 2019-07-17 ENCOUNTER — Other Ambulatory Visit: Payer: Self-pay | Admitting: Family Medicine

## 2019-07-17 DIAGNOSIS — R6889 Other general symptoms and signs: Secondary | ICD-10-CM | POA: Diagnosis not present

## 2019-07-17 NOTE — Telephone Encounter (Signed)
Requested Prescriptions  Pending Prescriptions Disp Refills  . amLODipine (NORVASC) 10 MG tablet [Pharmacy Med Name: amLODIPine Besylate 10MG  TABS] 30 tablet 0    Sig: TAKE 1 TABLET BY MOUTH DAILY     Cardiovascular:  Calcium Channel Blockers Failed - 07/17/2019  3:42 PM      Failed - Last BP in normal range    BP Readings from Last 1 Encounters:  06/28/19 (!) 181/87         Passed - Valid encounter within last 6 months    Recent Outpatient Visits          2 weeks ago    Primary Care at [facility], Arlie Solomons, MD   2 months ago Medically complex patient   Primary Care at [facility], Arlie Solomons, MD   4 months ago Essential hypertension   [facility] at [facility], Lilia Argue, MD   5 months ago Falls frequently   Primary Care at [facility], Arlie Solomons, MD   6 months ago Abnormality of gait   Primary Care at [place], MD      Future Appointments            In 1 week Forrest Moron, MD Primary Care at [place], [facility]   In 2 months Forrest Moron, MD Primary Care at [place], [facility]           . CHANTIX CONTINUING MONTH PAK 1 MG tablet [Pharmacy Med Name: Chantix Continuing Month Pak 1MG  TABS] 56 tablet 0    Sig: TAKE 1 TABLET BY MOUTH TWICE A DAY     Psychiatry:  Drug Dependence Therapy Passed - 07/17/2019  3:42 PM      Passed - Valid encounter within last 12 months    Recent Outpatient Visits          2 weeks ago    Primary Care at [facility], Arlie Solomons, MD   2 months ago Medically complex patient   Primary Care at [facility], Arlie Solomons, MD   4 months ago Essential hypertension   [facility] at [facility], Lilia Argue, MD   5 months ago Falls frequently   Primary Care at [facility], Arlie Solomons, MD   6 months ago Abnormality of gait   Primary Care at [place], MD      Future Appointments            In 1 week Forrest Moron, MD Primary Care at [place], [facility]   In 2 months Forrest Moron, MD Primary Care at [place], [place]           . oxybutynin (DITROPAN-XL) 5 MG 24 hr tablet [Pharmacy Med Name: Oxybutynin Chloride ER 5MG  TB24] 30 tablet     Sig: TAKE 1 TABLET BY MOUTH AT BEDTIME     Urology:  Bladder Agents Passed - 07/17/2019  3:42 PM      Passed - Valid encounter within last 12 months    Recent Outpatient Visits          2 weeks ago    Primary Care at [facility], Arlie Solomons, MD   2 months ago Medically complex patient   Primary Care at [facility], Arlie Solomons, MD   4 months ago Essential hypertension   [facility] at [facility], Lilia Argue, MD   5 months ago Falls frequently   Primary Care at [facility], Arlie Solomons, MD   6 months ago Abnormality  of gait   Primary Care at [place], MD      Future Appointments            In 1 week Forrest Moron, MD Primary Care at [place], [place]   In 2 months Forrest Moron, MD Primary Care at [place], [facility]

## 2019-07-17 NOTE — Telephone Encounter (Signed)
Requested medication (s) are due for refill today: yes  Requested medication (s) are on the active medication list: no  Last refill:  12/27/18  Future visit scheduled: yes  Notes to clinic:  rx expired 04/13/24/20   Requested Prescriptions  Pending Prescriptions Disp Refills   oxybutynin (DITROPAN-XL) 5 MG 24 hr tablet [Pharmacy Med Name: Oxybutynin Chloride ER 5MG  TB24] 30 tablet     Sig: TAKE 1 TABLET BY MOUTH AT BEDTIME      Urology:  Bladder Agents Passed - 07/17/2019  3:42 PM      Passed - Valid encounter within last 12 months    Recent Outpatient Visits           2 weeks ago    Primary Care at [facility], Arlie Solomons, MD   2 months ago Medically complex patient   Primary Care at [facility], Arlie Solomons, MD   4 months ago Essential hypertension   [facility] at [facility], Lilia Argue, MD   5 months ago Falls frequently   Primary Care at [facility], Arlie Solomons, MD   6 months ago Abnormality of gait   [facility] at [facility] [place], [place]       Future Appointments             In 1 week Forrest Moron, MD Primary Care at [place], [facility]   In 2 months Forrest Moron, MD Primary Care at [place], [facility]             Signed Prescriptions Disp Refills   amLODipine (NORVASC) 10 MG tablet 30 tablet 0    Sig: TAKE 1 TABLET BY MOUTH DAILY      Cardiovascular:  Calcium Channel Blockers Failed - 07/17/2019  3:42 PM      Failed - Last BP in normal range    BP Readings from Last 1 Encounters:  06/28/19 (!) 181/87          Passed - Valid encounter within last 6 months    Recent Outpatient Visits           2 weeks ago    Primary Care at [facility], Arlie Solomons, MD   2 months ago Medically complex patient   Primary Care at [facility], Arlie Solomons, MD   4 months ago Essential hypertension   [facility] at [facility], Lilia Argue, MD   5 months ago Falls frequently   Primary Care at [facility], Arlie Solomons, MD   6 months ago Abnormality of gait   Primary Care at  [place], MD       Future Appointments             In 1 week Forrest Moron, MD Primary Care at [place], [facility]   In 2 months Forrest Moron, MD Primary Care at [place], [place] PAK 1 MG tablet 56 tablet 0    Sig: TAKE 1 [place]      Psychiatry:  Drug Dependence Therapy Passed - 07/17/2019  3:42 PM      Passed - Valid encounter within last 12 months    Recent Outpatient Visits           2 weeks ago    Primary Care at [facility], Arlie Solomons, MD   2 months ago Medically complex patient   Primary Care at  [place], MD   4 months ago Essential hypertension   Primary [facility] at [facility], Lilia Argue, MD   5 months ago Falls frequently   Primary Care at [facility], Arlie Solomons, MD   6 months ago Abnormality of gait   Primary Care at [place], MD       Future Appointments             In 1 week Forrest Moron, MD Primary Care at [place], [place]   In 2 months Forrest Moron, MD Primary Care at [place], [facility]

## 2019-07-18 DIAGNOSIS — R6889 Other general symptoms and signs: Secondary | ICD-10-CM | POA: Diagnosis not present

## 2019-07-24 ENCOUNTER — Other Ambulatory Visit: Payer: Self-pay

## 2019-07-24 ENCOUNTER — Encounter: Payer: Medicare HMO | Admitting: Family Medicine

## 2019-07-24 DIAGNOSIS — Z5329 Procedure and treatment not carried out because of patient's decision for other reasons: Secondary | ICD-10-CM

## 2019-07-24 DIAGNOSIS — Z91199 Patient's noncompliance with other medical treatment and regimen due to unspecified reason: Secondary | ICD-10-CM

## 2019-07-24 NOTE — Patient Instructions (Signed)
° ° ° °  If you have lab work done today you will be contacted with your lab results within the next 2 weeks.  If you have not heard from us then please contact us. The fastest way to get your results is to register for My Chart. ° ° °IF you received an x-ray today, you will receive an invoice from [facility]. Please contact [facility] at [phone] with questions or concerns regarding your invoice.  ° °IF you received labwork today, you will receive an invoice from [facility]. Please contact [facility] at [phone] with questions or concerns regarding your invoice.  ° °Our billing staff will not be able to assist you with questions regarding bills from these companies. ° °You will be contacted with the lab results as soon as they are available. The fastest way to get your results is to activate your My Chart account. Instructions are located on the last page of this paperwork. If you have not heard from us regarding the results in 2 weeks, please contact this office. °  ° ° ° °

## 2019-07-24 NOTE — Progress Notes (Signed)
CC: consultation/approval for gastric sleeve procedure.  Per pt it has to be approved by her primary.  Per pt she is no longer taking the  Clonidine but wants to try new med [place].  No recent weight but bp on yesterday was 168/86.  No travel outside the [place] or [place] in the past 3 weeks.

## 2019-07-24 NOTE — Progress Notes (Signed)
No show

## 2019-08-07 ENCOUNTER — Other Ambulatory Visit: Payer: Self-pay

## 2019-08-07 ENCOUNTER — Telehealth (INDEPENDENT_AMBULATORY_CARE_PROVIDER_SITE_OTHER): Payer: Medicare HMO | Admitting: Family Medicine

## 2019-08-07 MED ORDER — OXYBUTYNIN CHLORIDE ER 5 MG PO TB24: 5.0000 mg | ORAL_TABLET | Freq: Every day | ORAL | 0 refills | Status: DC

## 2019-08-07 NOTE — Progress Notes (Signed)
Pt is concerned due to having exposure with someone that has tested + for covid. She was with this person on this past Friday, the person was confirmed this past Sunday. She is also requesting medical opinion with getting the gastric sleeve and referral if pcp has not reservations to her getting this procedure. Lastly, she is requesting refill on oxybutin 5mg . Medication and pharmacy have been verified

## 2019-08-07 NOTE — Progress Notes (Signed)
Telemedicine Encounter- SOAP NOTE Established Patient  This telephone encounter was conducted with the patient's (or proxy's) verbal consent via audio telecommunications: yes/no: Yes Patient was instructed to have this encounter in a suitably private space; and to only have persons present to whom they give permission to participate. In addition, patient identity was confirmed by use of name plus two identifiers (DOB and address).  I discussed the limitations, risks, security and privacy concerns of performing an evaluation and management service by telephone and the availability of in person appointments. I also discussed with the patient that there may be a patient responsible charge related to this service. The patient expressed understanding and agreed to proceed.  I spent a total of TIME; 0 MIN TO 60 MIN: 15 minutes talking with the patient or their proxy.  CC: I want gastric bypass  Subjective   Sherry [name] is a 64 y.o. established patient. Telephone visit today for  HPI   Pt states that her friend had bariatic surgery so she wants to do it too. She does not check her bp  She can't exercise due to weakness  Her home care giver helps out    Patient Active Problem List   Diagnosis Date Noted  . Chronic diastolic CHF (congestive heart failure) ([place]) 05/09/2019  . GERD (gastroesophageal reflux disease) 05/09/2019  . Alcohol abuse 05/09/2019  . Hypertension 05/08/2019  . Bilateral low back pain with bilateral sciatica 01/05/2019  . Cyst of ovary   . Pre-operative cardiovascular examination 06/13/2018  . Dizziness 02/24/2018  . Left-sided weakness 02/24/2018  . Cigarette smoker 12/31/2017  . DOE (dyspnea on exertion) 12/30/2017  . Phlebitis after infusion 10/28/2017  . Hypertensive emergency 10/21/2017  . Microcytic anemia 10/21/2017  . Medication intolerance 09/06/2017  . Neuroleptic-induced tardive dyskinesia 09/06/2017  . Cancer ([place]) 09/06/2017  . Chronic low back  pain 09/06/2017  . Grief reaction with prolonged bereavement 09/06/2017  . Opioid use disorder, severe, dependence ([place]) 08/27/2017  . Alcohol use disorder, severe, dependence ([place]) 08/27/2017  . Substance induced mood disorder ([place]) 08/13/2017  . AKI (acute kidney injury) ([place])   . MDD (major depressive disorder), recurrent severe, without psychosis ([place])   . Alcohol withdrawal ([place]) 08/09/2017  . Essential hypertension 07/06/2017  . Chronic kidney disease, stage III (moderate) 11/17/2016  . Depression with anxiety 08/21/2016  . Hx of bipolar disorder 01/31/2016  . History of breast cancer 02/13/2013  . Morbid obesity due to excess calories ([place]) 02/13/2013  . Hepatitis C virus infection cured after antiviral drug therapy 12/17/2012    Past Medical History:  Diagnosis Date  . Anxiety   . Bipolar and related disorder ([place])   . Blood transfusion without reported diagnosis   . Breast cancer ([place])    right lumpectemy and lymph node removal  . CHF (congestive heart failure) ([place])   . Chronic back pain   . Chronic kidney disease    "Stage IV" - states due to hypertension  . Depression   . GERD (gastroesophageal reflux disease)   . Hepatitis C   . Hypertension   . Left-sided weakness   . MRSA infection    of breast incision  . Opioid dependence ([place])    Has been treated in [facility] in past  . Substance abuse ([place])   . TIA (transient ischemic attack) 2020   P/w BP >230/120 and neurologic symptoms, presumed TIA    Current Outpatient Medications  Medication Sig Dispense Refill  . amLODipine (NORVASC) 10  MG tablet TAKE 1 TABLET BY MOUTH DAILY 30 tablet 0  . aspirin 81 MG chewable tablet Chew 1 tablet (81 mg total) by mouth daily. 30 tablet 0  . atorvastatin (LIPITOR) 40 MG tablet Take 1 tablet (40 mg total) by mouth daily at 6 PM. 30 tablet 0  . CHANTIX CONTINUING MONTH PAK 1 MG tablet TAKE 1 TABLET BY MOUTH TWICE A DAY 56 tablet 0  . diclofenac Sodium (VOLTAREN) 1 % GEL  Apply 4 g topically 4 (four) times daily. 100 g 0  . ferrous sulfate 325 (65 FE) MG tablet Take 1 tablet (325 mg total) by mouth daily with breakfast. 90 tablet 3  . FLUoxetine (PROZAC) 40 MG capsule Take 40 mg by mouth daily.     . furosemide (LASIX) 20 MG tablet Take 1 tablet (20 mg total) by mouth daily. 30 tablet 0  . gabapentin (NEURONTIN) 800 MG tablet Take 1 tablet (800 mg total) by mouth 3 (three) times daily. 270 tablet 1  . hydrALAZINE (APRESOLINE) 25 MG tablet Take 1 tablet (25 mg total) by mouth 3 (three) times daily. 90 tablet 0  . HYDROcodone-acetaminophen (NORCO/VICODIN) 5-325 MG tablet Take 1-2 tablets by mouth every 6 (six) hours as needed. 10 tablet 0  . hydrOXYzine (VISTARIL) 25 MG capsule Take 25 mg by mouth 4 (four) times daily as needed for anxiety.    . INGREZZA 80 MG CAPS Take 80 mg by mouth daily.    . isosorbide mononitrate (IMDUR) 60 MG 24 hr tablet TAKE 1 TABLET (60 MG TOTAL) BY MOUTH DAILY. 90 tablet 1  . labetalol (NORMODYNE) 200 MG tablet Take 1 tablet (200 mg total) by mouth 2 (two) times daily. 60 tablet 0  . lidocaine (LIDODERM) 5 % Place 1 patch onto the skin daily. Remove & Discard patch within 12 hours or as directed by MD 30 patch 0  . Multiple Vitamins-Minerals (ADULT GUMMY) CHEW Chew 1 tablet by mouth daily.    . ondansetron (ZOFRAN) 4 MG tablet Take 1 tablet (4 mg total) by mouth every 8 (eight) hours as needed for nausea or vomiting. 30 tablet 6  . pantoprazole (PROTONIX) 20 MG tablet TAKE 1 TABLET BY MOUTH TWICE A DAY 60 tablet 0  . predniSONE (DELTASONE) 10 MG tablet Take 4 tablets (40 mg total) by mouth daily. 20 tablet 0  . terazosin (HYTRIN) 2 MG capsule Take 1 capsule (2 mg total) by mouth at bedtime. 90 capsule 1  . oxybutynin (DITROPAN-XL) 5 MG 24 hr tablet      No current facility-administered medications for this visit.    Allergies  Allergen Reactions  . Abilify [Aripiprazole] Other (See Comments)    Tardive dyskinesia Oral  . Remeron  [Mirtazapine] Other (See Comments)    Wgt stimulation /gain, Dizziness, Patient says "can tolerate"  . Trazodone And Nefazodone Other (See Comments)    Nightmares/sleep diturbance  . Flexeril [Cyclobenzaprine] Other (See Comments)    Pt states Flexeril makes her feel depressed   . Amoxicillin Diarrhea and Other (See Comments)    NOTE the patient has had PCN WITHOUT reaction Has patient had a PCN reaction causing immediate rash, facial/tongue/throat swelling, SOB or lightheadedness with hypotension: No Has patient had a PCN reaction causing severe rash involving mucus membranes or skin necrosis: No Has patient had a PCN reaction that required hospitalization: No Has patient had a PCN reaction occurring within the last 10 years: No If all of the above answers are "NO", then may proceed  with Cephalosporin use.     Social History   Socioeconomic History  . Marital status: Single    Spouse name: Not on file  . Number of children: 0  . Years of education: 59  . Highest education level: High school graduate  Occupational History  . Occupation: Disabled  Tobacco Use  . Smoking status: Current Every Day Smoker    Packs/day: 0.25    Types: Cigarettes  . Smokeless tobacco: Never Used  . Tobacco comment: 4 cigs per day 01/2019  Substance and Sexual Activity  . Alcohol use: No  . Drug use: Not Currently    Comment: h/o IVD use  . Sexual activity: Not Currently  Other Topics Concern  . Not on file  Social History Narrative   Lives at home alone.   Right-handed.   Caffeine use: 4 cups coffee/soda   Social Determinants of Health   Financial Resource Strain:   . Difficulty of Paying Living Expenses: Not on file  Food Insecurity:   . Worried About Charity fundraiser in the Last Year: Not on file  . Ran Out of Food in the Last Year: Not on file  Transportation Needs:   . Lack of Transportation (Medical): Not on file  . Lack of Transportation (Non-Medical): Not on file  Physical  Activity:   . Days of Exercise per Week: Not on file  . Minutes of Exercise per Session: Not on file  Stress:   . Feeling of Stress : Not on file  Social Connections:   . Frequency of Communication with Friends and Family: Not on file  . Frequency of Social Gatherings with Friends and Family: Not on file  . Attends Religious Services: Not on file  . Active Member of Clubs or Organizations: Not on file  . Attends Archivist Meetings: Not on file  . Marital Status: Not on file  Intimate Partner Violence:   . Fear of Current or Ex-Partner: Not on file  . Emotionally Abused: Not on file  . Physically Abused: Not on file  . Sexually Abused: Not on file    ROS Review of Systems  Constitutional: Negative for activity change, appetite change, chills and fever.  HENT: Negative for congestion, nosebleeds, trouble swallowing and voice change.   Respiratory: Negative for cough, shortness of breath and wheezing.   Gastrointestinal: Negative for diarrhea, nausea and vomiting.  Genitourinary: Negative for difficulty urinating, dysuria, flank pain and hematuria.  Musculoskeletal: Negative for back pain, joint swelling and neck pain.  Neurological: Negative for dizziness, speech difficulty, light-headedness and numbness.  See HPI. All other review of systems negative.   Objective   Vitals as reported by the patient: There were no vitals filed for this visit.  Diagnoses and all orders for this visit:  Morbid obesity [facility])   Patient is interested in bariatic surgery because of her friend having the surgery She wants to lose weight. Discussed that due to her heart disease, renal failure, and poor health she would need to be cleared by her nephrologist and cardiologist and she is not currently stable from the aspect of her dizziness and hypertension  She would like to proceed with referral Placed referral but this is not something I would recommend.   I discussed the assessment  and treatment plan with the patient. The patient was provided an opportunity to ask questions and all were answered. The patient agreed with the plan and demonstrated an understanding of the instructions.   The patient  was advised to call back or seek an in-person evaluation if the symptoms worsen or if the condition fails to improve as anticipated.  I provided 15 minutes of non-face-to-face time during this encounter.  Forrest Moron, MD  Primary Care at [facility]

## 2019-08-08 DIAGNOSIS — Z03818 Encounter for observation for suspected exposure to other biological agents ruled out: Secondary | ICD-10-CM | POA: Diagnosis not present

## 2019-08-15 DIAGNOSIS — Z20828 Contact with and (suspected) exposure to other viral communicable diseases: Secondary | ICD-10-CM | POA: Diagnosis not present

## 2019-08-16 ENCOUNTER — Other Ambulatory Visit: Payer: Self-pay | Admitting: Family Medicine

## 2019-08-16 ENCOUNTER — Encounter: Payer: Self-pay | Admitting: Family Medicine

## 2019-08-30 ENCOUNTER — Other Ambulatory Visit: Payer: Self-pay | Admitting: Family Medicine

## 2019-08-30 DIAGNOSIS — F332 Major depressive disorder, recurrent severe without psychotic features: Secondary | ICD-10-CM | POA: Diagnosis not present

## 2019-08-30 NOTE — Telephone Encounter (Signed)
Requested medication (s) are due for refill today: pantoprazole, amlodipine, chantix, & amlodipine, yes  Requested medication (s) are on the active medication list: yes  Last refill: 07/17/19  Future visit scheduled: no  Notes to clinic:  maximum number of refills reached  Requested medication (s) are due for refill today: Oxybutynin,  yes  Requested medication (s) are on the active medication list: yes  Last refill:  06/19/2019   Future visit scheduled: no  Notes to clinic:  maximum number of refills reached   Requested Prescriptions  Pending Prescriptions Disp Refills   pantoprazole (PROTONIX) 20 MG tablet [Pharmacy Med Name: Pantoprazole Sodium 20MG  TBEC] 60 tablet 0    Sig: TAKE 1 TABLET BY MOUTH TWICE A DAY      Gastroenterology: Proton Pump Inhibitors Passed - 08/30/2019 11:41 AM      Passed - Valid encounter within last 12 months    Recent Outpatient Visits           3 weeks ago Morbid obesity ([place])   Primary Care at [facility], Arlie Solomons, MD   2 months ago    Primary Care at [facility], Arlie Solomons, MD   4 months ago Medically complex patient   Primary Care at [facility], Arlie Solomons, MD   5 months ago Essential hypertension   Primary Care at [facility], Lilia Argue, MD   7 months ago Falls frequently   Primary Care at [place], MD       Future Appointments             In 1 month Forrest Moron, MD Primary Care at [place], [place]              cloNIDine (CATAPRES) 0.1 MG tablet [Pharmacy Med Name: cloNIDine HCl 0.1MG  TABS] 60 tablet     Sig: TAKE 1 TABLET BY MOUTH TWICE A DAY      Cardiovascular:  Alpha-2 Agonists Failed - 08/30/2019 11:41 AM      Failed - Last BP in normal range    BP Readings from Last 1 Encounters:  06/28/19 (!) 181/87          Passed - Last Heart Rate in normal range    Pulse Readings from Last 1 Encounters:  06/04/19 76          Passed - Valid encounter within last 6 months    Recent Outpatient Visits            3 weeks ago Morbid obesity ([place])   Primary Care at [facility], Arlie Solomons, MD   2 months ago    Primary Care at [facility], Arlie Solomons, MD   4 months ago Medically complex patient   Primary Care at [facility], Arlie Solomons, MD   5 months ago Essential hypertension   Primary Care at [facility], Lilia Argue, MD   7 months ago Falls frequently   Primary Care at [place], MD       Future Appointments             In 1 month Forrest Moron, MD Primary Care at [place], [place] 1 MG tablet [Pharmacy Med Name: Chantix 1MG  TABS] 56 tablet 0    Sig: TAKE 1 [place]      Psychiatry:  Drug Dependence Therapy Passed - 08/30/2019 11:41 AM  Passed - Valid encounter within last 12 months    Recent Outpatient Visits           3 weeks ago Morbid obesity ([place])   Primary Care at [facility], Arlie Solomons, MD   2 months ago    Primary Care at [facility], Arlie Solomons, MD   4 months ago Medically complex patient   Primary Care at [facility], Arlie Solomons, MD   5 months ago Essential hypertension   Primary Care at [facility], Lilia Argue, MD   7 months ago Falls frequently   Primary Care at [place], MD       Future Appointments             In 1 month Forrest Moron, MD Primary Care at [place], [place]              amLODipine ([place]) 10 MG tablet [Pharmacy Med Name: amLODIPine Besylate 10MG  TABS] 30 tablet 0    Sig: TAKE 1 TABLET BY MOUTH DAILY      Cardiovascular:  Calcium Channel Blockers Failed - 08/30/2019 11:41 AM      Failed - Last BP in normal range    BP Readings from Last 1 Encounters:  06/28/19 (!) 181/87          Passed - Valid encounter within last 6 months    Recent Outpatient Visits           3 weeks ago Morbid obesity ([place])   Primary Care at [facility], Arlie Solomons, MD   2 months ago    Primary Care at [facility], Arlie Solomons, MD   4 months ago Medically complex patient    Primary Care at [facility], Arlie Solomons, MD   5 months ago Essential hypertension   Primary Care at [facility], Lilia Argue, MD   7 months ago Falls frequently   Primary Care at [place], MD       Future Appointments             In 1 month Forrest Moron, MD Primary Care at [place], [facility]              oxybutynin (DITROPAN-XL) 5 MG 24 hr tablet [Pharmacy Med Name: Oxybutynin Chloride ER 5MG  TB24] 30 tablet     Sig: TAKE 1 TABLET BY MOUTH AT BEDTIME      Urology:  Bladder Agents Passed - 08/30/2019 11:41 AM      Passed - Valid encounter within last 12 months    Recent Outpatient Visits           3 weeks ago Morbid obesity ([place])   Primary Care at [facility], Arlie Solomons, MD   2 months ago    Primary Care at [facility], Arlie Solomons, MD   4 months ago Medically complex patient   Primary Care at [place], MD   5 months ago Essential hypertension   Primary Care at [facility], Lilia Argue, MD   7 months ago Falls frequently   Primary Care at [place], MD       Future Appointments             In 1 month Forrest Moron, MD Primary Care at [place], [facility]

## 2019-09-06 DIAGNOSIS — Z20822 Contact with and (suspected) exposure to covid-19: Secondary | ICD-10-CM | POA: Diagnosis not present

## 2019-09-12 NOTE — Telephone Encounter (Signed)
Spoke with pt and schedule appt.

## 2019-09-13 ENCOUNTER — Telehealth (INDEPENDENT_AMBULATORY_CARE_PROVIDER_SITE_OTHER): Payer: Medicare HMO | Admitting: Family Medicine

## 2019-09-13 ENCOUNTER — Encounter: Payer: Self-pay | Admitting: Family Medicine

## 2019-09-13 ENCOUNTER — Other Ambulatory Visit: Payer: Self-pay

## 2019-09-13 DIAGNOSIS — Z716 Tobacco abuse counseling: Secondary | ICD-10-CM | POA: Diagnosis not present

## 2019-09-13 DIAGNOSIS — Z23 Encounter for immunization: Secondary | ICD-10-CM | POA: Diagnosis not present

## 2019-09-13 DIAGNOSIS — N184 Chronic kidney disease, stage 4 (severe): Secondary | ICD-10-CM | POA: Diagnosis not present

## 2019-09-13 DIAGNOSIS — Z760 Encounter for issue of repeat prescription: Secondary | ICD-10-CM

## 2019-09-13 DIAGNOSIS — I10 Essential (primary) hypertension: Secondary | ICD-10-CM | POA: Diagnosis not present

## 2019-09-13 MED ORDER — AMLODIPINE BESYLATE 10 MG PO TABS: 10.0000 mg | ORAL_TABLET | Freq: Every day | ORAL | 1 refills | Status: DC

## 2019-09-13 MED ORDER — PANTOPRAZOLE SODIUM 20 MG PO TBEC: 20.0000 mg | DELAYED_RELEASE_TABLET | Freq: Two times a day (BID) | ORAL | 1 refills | Status: DC

## 2019-09-13 MED ORDER — GABAPENTIN 800 MG PO TABS: 800.0000 mg | ORAL_TABLET | Freq: Three times a day (TID) | ORAL | 1 refills | Status: DC

## 2019-09-13 MED ORDER — VARENICLINE TARTRATE 1 MG PO TABS: 1.0000 mg | ORAL_TABLET | Freq: Two times a day (BID) | ORAL | 3 refills | Status: DC

## 2019-09-13 NOTE — Patient Instructions (Addendum)
  COVID-19 Vaccine Information can be found at: [email or web address].[email or web address] For questions related to vaccine distribution or appointments, please email [email or web address] or call [phone].      If you have lab work done today you will be contacted with your lab results within the next 2 weeks.  If you have not heard from [place] then please contact us. The fastest way to get your results is to register for My Chart.   IF you received an x-ray today, you will receive an invoice from [facility]. Please contact [facility] at [phone] with questions or concerns regarding your invoice.   IF you received labwork today, you will receive an invoice from [facility]. Please contact [facility] at [phone] with questions or concerns regarding your invoice.   Our billing staff will not be able to assist you with questions regarding bills from these companies.  You will be contacted with the lab results as soon as they are available. The fastest way to get your results is to activate your My Chart account. Instructions are located on the last page of this paperwork. If you have not heard from [place] regarding the results in 2 weeks, please contact this office.

## 2019-09-13 NOTE — Progress Notes (Signed)
Telemedicine Encounter- SOAP NOTE Established Patient  This telephone encounter was conducted with the patient's (or proxy's) verbal consent via audio telecommunications: yes/no: Yes Patient was instructed to have this encounter in a suitably private space; and to only have persons present to whom they give permission to participate. In addition, patient identity was confirmed by use of name plus two identifiers (DOB and address).  I discussed the limitations, risks, security and privacy concerns of performing an evaluation and management service by telephone and the availability of in person appointments. I also discussed with the patient that there may be a patient responsible charge related to this service. The patient expressed understanding and agreed to proceed.  I spent a total of TIME; 0 MIN TO 60 MIN: 15 minutes talking with the patient or their proxy.  Chief Complaint  Patient presents with  . Hypertension  . Nicotine Dependence    med refills     Subjective   Sherry [name] is a 64 y.o. established patient. Telephone visit today for  HPI  Hypertension Pt reports that she is taking her bp meds Denies recent falls No chest pains  CKD  She states that has an appt with Nephrology at the end of the month States that she would like to continue her current meds She denies taking nsaids  Smoking She would like a refill of chantix She is down to 5 cigarettes a day She denies any cough currently  Patient Active Problem List   Diagnosis Date Noted  . Chronic diastolic CHF (congestive heart failure) ([place]) 05/09/2019  . GERD (gastroesophageal reflux disease) 05/09/2019  . Alcohol abuse 05/09/2019  . Hypertension 05/08/2019  . Bilateral low back pain with bilateral sciatica 01/05/2019  . Cyst of ovary   . Pre-operative cardiovascular examination 06/13/2018  . Dizziness 02/24/2018  . Left-sided weakness 02/24/2018  . Cigarette smoker 12/31/2017  . DOE (dyspnea on  exertion) 12/30/2017  . Phlebitis after infusion 10/28/2017  . Hypertensive emergency 10/21/2017  . Microcytic anemia 10/21/2017  . Medication intolerance 09/06/2017  . Neuroleptic-induced tardive dyskinesia 09/06/2017  . Cancer ([place]) 09/06/2017  . Chronic low back pain 09/06/2017  . Grief reaction with prolonged bereavement 09/06/2017  . Opioid use disorder, severe, dependence ([place]) 08/27/2017  . Alcohol use disorder, severe, dependence ([place]) 08/27/2017  . Substance induced mood disorder ([place]) 08/13/2017  . AKI (acute kidney injury) ([place])   . MDD (major depressive disorder), recurrent severe, without psychosis ([place])   . Alcohol withdrawal ([place]) 08/09/2017  . Essential hypertension 07/06/2017  . Chronic kidney disease, stage III (moderate) 11/17/2016  . Depression with anxiety 08/21/2016  . Hx of bipolar disorder 01/31/2016  . History of breast cancer 02/13/2013  . Morbid obesity due to excess calories ([place]) 02/13/2013  . Hepatitis C virus infection cured after antiviral drug therapy 12/17/2012    Past Medical History:  Diagnosis Date  . Anxiety   . Bipolar and related disorder ([place])   . Blood transfusion without reported diagnosis   . Breast cancer ([place])    right lumpectemy and lymph node removal  . CHF (congestive heart failure) ([place])   . Chronic back pain   . Chronic kidney disease    "Stage IV" - states due to hypertension  . Depression   . GERD (gastroesophageal reflux disease)   . Hepatitis C   . Hypertension   . Left-sided weakness   . MRSA infection    of breast incision  . Opioid dependence ([place])  Has been treated in [facility] in past  . Substance abuse ([place])   . TIA (transient ischemic attack) 2020   P/w BP >230/120 and neurologic symptoms, presumed TIA    Current Outpatient Medications  Medication Sig Dispense Refill  . amLODipine (NORVASC) 10 MG tablet Take 1 tablet (10 mg total) by mouth daily. 90 tablet 1  . ferrous sulfate 325 (65 FE) MG tablet  Take 1 tablet (325 mg total) by mouth daily with breakfast. 90 tablet 3  . FLUoxetine (PROZAC) 40 MG capsule Take 40 mg by mouth daily.     Marland Kitchen gabapentin (NEURONTIN) 800 MG tablet Take 1 tablet (800 mg total) by mouth 3 (three) times daily. 270 tablet 1  . hydrALAZINE (APRESOLINE) 25 MG tablet Take 1 tablet (25 mg total) by mouth 3 (three) times daily. 90 tablet 0  . INGREZZA 80 MG CAPS Take 80 mg by mouth daily.    . isosorbide mononitrate (IMDUR) 60 MG 24 hr tablet TAKE 1 TABLET (60 MG TOTAL) BY MOUTH DAILY. 90 tablet 1  . labetalol (NORMODYNE) 200 MG tablet Take 1 tablet (200 mg total) by mouth 2 (two) times daily. 60 tablet 0  . Multiple Vitamins-Minerals (ADULT GUMMY) CHEW Chew 1 tablet by mouth daily.    . ondansetron (ZOFRAN) 4 MG tablet Take 1 tablet (4 mg total) by mouth every 8 (eight) hours as needed for nausea or vomiting. 30 tablet 6  . oxybutynin (DITROPAN-XL) 5 MG 24 hr tablet Take 1 tablet (5 mg total) by mouth at bedtime. 90 tablet 0  . pantoprazole (PROTONIX) 20 MG tablet Take 1 tablet (20 mg total) by mouth 2 (two) times daily. 180 tablet 1  . terazosin (HYTRIN) 2 MG capsule Take 1 capsule (2 mg total) by mouth at bedtime. 90 capsule 1  . varenicline (CHANTIX CONTINUING MONTH PAK) 1 MG tablet Take 1 tablet (1 mg total) by mouth 2 (two) times daily. 56 tablet 3  . aspirin 81 MG chewable tablet Chew 1 tablet (81 mg total) by mouth daily. (Patient not taking: Reported on 09/13/2019) 30 tablet 0  . atorvastatin (LIPITOR) 40 MG tablet Take 1 tablet (40 mg total) by mouth daily at 6 PM. (Patient not taking: Reported on 09/13/2019) 30 tablet 0  . diclofenac Sodium (VOLTAREN) 1 % GEL Apply 4 g topically 4 (four) times daily. (Patient not taking: Reported on 09/13/2019) 100 g 0  . furosemide (LASIX) 20 MG tablet Take 1 tablet (20 mg total) by mouth daily. (Patient not taking: Reported on 09/13/2019) 30 tablet 0  . HYDROcodone-acetaminophen (NORCO/VICODIN) 5-325 MG tablet Take 1-2 tablets by mouth  every 6 (six) hours as needed. (Patient not taking: Reported on 09/13/2019) 10 tablet 0  . hydrOXYzine (VISTARIL) 25 MG capsule Take 25 mg by mouth 4 (four) times daily as needed for anxiety.    . lidocaine (LIDODERM) 5 % Place 1 patch onto the skin daily. Remove & Discard patch within 12 hours or as directed by MD (Patient not taking: Reported on 09/13/2019) 30 patch 0   No current facility-administered medications for this visit.    Allergies  Allergen Reactions  . Abilify [Aripiprazole] Other (See Comments)    Tardive dyskinesia Oral  . Remeron [Mirtazapine] Other (See Comments)    Wgt stimulation /gain, Dizziness, Patient says "can tolerate"  . Trazodone And Nefazodone Other (See Comments)    Nightmares/sleep diturbance  . Flexeril [Cyclobenzaprine] Other (See Comments)    Pt states Flexeril makes her feel depressed   .  Amoxicillin Diarrhea and Other (See Comments)    NOTE the patient has had PCN WITHOUT reaction Has patient had a PCN reaction causing immediate rash, facial/tongue/throat swelling, SOB or lightheadedness with hypotension: No Has patient had a PCN reaction causing severe rash involving mucus membranes or skin necrosis: No Has patient had a PCN reaction that required hospitalization: No Has patient had a PCN reaction occurring within the last 10 years: No If all of the above answers are "NO", then may proceed with Cephalosporin use.     Social History   Socioeconomic History  . Marital status: Single    Spouse name: Not on file  . Number of children: 0  . Years of education: 58  . Highest education level: High school graduate  Occupational History  . Occupation: Disabled  Tobacco Use  . Smoking status: Current Every Day Smoker    Packs/day: 0.25    Types: Cigarettes  . Smokeless tobacco: Never Used  . Tobacco comment: 4 cigs per day 01/2019  Substance and Sexual Activity  . Alcohol use: No  . Drug use: Not Currently    Comment: h/o IVD use  . Sexual  activity: Not Currently  Other Topics Concern  . Not on file  Social History Narrative   Lives at home alone.   Right-handed.   Caffeine use: 4 cups coffee/soda   Social Determinants of Health   Financial Resource Strain:   . Difficulty of Paying Living Expenses: Not on file  Food Insecurity:   . Worried About Charity fundraiser in the Last Year: Not on file  . Ran Out of Food in the Last Year: Not on file  Transportation Needs:   . Lack of Transportation (Medical): Not on file  . Lack of Transportation (Non-Medical): Not on file  Physical Activity:   . Days of Exercise per Week: Not on file  . Minutes of Exercise per Session: Not on file  Stress:   . Feeling of Stress : Not on file  Social Connections:   . Frequency of Communication with Friends and Family: Not on file  . Frequency of Social Gatherings with Friends and Family: Not on file  . Attends Religious Services: Not on file  . Active Member of Clubs or Organizations: Not on file  . Attends Archivist Meetings: Not on file  . Marital Status: Not on file  Intimate Partner Violence:   . Fear of Current or Ex-Partner: Not on file  . Emotionally Abused: Not on file  . Physically Abused: Not on file  . Sexually Abused: Not on file    ROS See hpi  Objective   No exam performed due to phone visit  Vitals as reported by the patient: There were no vitals filed for this visit.  Annalese was seen today for hypertension and nicotine dependence.  Diagnoses and all orders for this visit:  Essential hypertension- Patient's blood pressure is at goal of 139/89 or less. Condition is stable. Continue current medications and treatment plan. I recommend that you exercise for 30-45 minutes 5 days a week. I also recommend a balanced diet with fruits and vegetables every day, lean meats, and little fried foods. The DASH diet (you can find this online) is a good example of this.   High priority for COVID-19 virus  vaccination- discussed covid-19, advised pt to get vaccine Signed pt up on the waiting list  Medication refill  Encounter for smoking cessation counseling- discussed chantix  CKD (chronic kidney disease)  stage 4, GFR 15-29 ml/min (HCC)-  Discussed that she should forward her labs from Nephrology so we can update our records  Other orders -     varenicline (CHANTIX CONTINUING MONTH PAK) 1 MG tablet; Take 1 tablet (1 mg total) by mouth 2 (two) times daily. -     amLODipine (NORVASC) 10 MG tablet; Take 1 tablet (10 mg total) by mouth daily. -     pantoprazole (PROTONIX) 20 MG tablet; Take 1 tablet (20 mg total) by mouth 2 (two) times daily. -     gabapentin (NEURONTIN) 800 MG tablet; Take 1 tablet (800 mg total) by mouth 3 (three) times daily.     I discussed the assessment and treatment plan with the patient. The patient was provided an opportunity to ask questions and all were answered. The patient agreed with the plan and demonstrated an understanding of the instructions.   The patient was advised to call back or seek an in-person evaluation if the symptoms worsen or if the condition fails to improve as anticipated.  I provided 20 minutes of non-face-to-face time during this encounter.  Forrest Moron, MD  Primary Care at [facility]

## 2019-09-13 NOTE — Progress Notes (Signed)
Med refill request. Meds pended

## 2019-09-18 ENCOUNTER — Emergency Department (HOSPITAL_BASED_OUTPATIENT_CLINIC_OR_DEPARTMENT_OTHER)
Admission: EM | Admit: 2019-09-18 | Discharge: 2019-09-18 | Disposition: A | Discharge status: Home / Self Care | Payer: Medicare HMO | Attending: Emergency Medicine | Admitting: Emergency Medicine

## 2019-09-18 ENCOUNTER — Encounter (HOSPITAL_BASED_OUTPATIENT_CLINIC_OR_DEPARTMENT_OTHER): Payer: Self-pay | Admitting: *Deleted

## 2019-09-18 ENCOUNTER — Other Ambulatory Visit: Payer: Self-pay

## 2019-09-18 DIAGNOSIS — F17210 Nicotine dependence, cigarettes, uncomplicated: Secondary | ICD-10-CM | POA: Diagnosis not present

## 2019-09-18 DIAGNOSIS — Z79899 Other long term (current) drug therapy: Secondary | ICD-10-CM | POA: Diagnosis not present

## 2019-09-18 DIAGNOSIS — M545 Low back pain, unspecified: Secondary | ICD-10-CM

## 2019-09-18 DIAGNOSIS — I110 Hypertensive heart disease with heart failure: Secondary | ICD-10-CM | POA: Insufficient documentation

## 2019-09-18 DIAGNOSIS — I5032 Chronic diastolic (congestive) heart failure: Secondary | ICD-10-CM | POA: Insufficient documentation

## 2019-09-18 DIAGNOSIS — G8929 Other chronic pain: Secondary | ICD-10-CM | POA: Insufficient documentation

## 2019-09-18 MED ORDER — SODIUM CHLORIDE 0.9% FLUSH
3.0000 mL | Freq: Once | INTRAVENOUS | Status: DC
  Filled 2019-09-18: qty 3

## 2019-09-18 MED ORDER — LIDOCAINE 5 % EX PTCH: 1.0000 | MEDICATED_PATCH | CUTANEOUS | 0 refills | Status: DC

## 2019-09-18 MED ORDER — KETOROLAC TROMETHAMINE 30 MG/ML IJ SOLN
30.0000 mg | Freq: Once | INTRAMUSCULAR | Status: AC
  Administered 2019-09-18: 30 mg via INTRAMUSCULAR
  Filled 2019-09-18: qty 1

## 2019-09-18 MED ORDER — HYDROMORPHONE HCL 1 MG/ML IJ SOLN
2.0000 mg | Freq: Once | INTRAMUSCULAR | Status: AC
  Administered 2019-09-18: 2 mg via INTRAMUSCULAR
  Filled 2019-09-18: qty 2

## 2019-09-18 NOTE — ED Notes (Signed)
ED Provider at bedside. 

## 2019-09-18 NOTE — ED Triage Notes (Signed)
Back and abdominal pain with nausea x 2 days.

## 2019-09-18 NOTE — Discharge Instructions (Addendum)
If you develop worsening, recurrent, or continued back pain, numbness or weakness in the legs, incontinence of your bowels or bladders, numbness of your buttocks, fever, abdominal pain, or any other new/concerning symptoms then return to the ER for evaluation.  

## 2019-09-18 NOTE — ED Provider Notes (Signed)
[facility] EMERGENCY DEPARTMENT Provider Note   CSN: [id number] Arrival date & time: 09/18/19  1410     History Chief Complaint  Patient presents with  . Back Pain  . Abdominal Pain    Sherry [name] is a 64 y.o. female.  HPI 64 year old female presents with acute low back pain.  Ongoing for about 3 days.  No trauma to incite it.  Has had back pain multiple times before.  Currently pain is severe.  No fevers, abdominal pain, vomiting, diarrhea or constipation.  She has felt nauseated.  No urinary symptoms.  No weakness or numbness in her legs though she has had some pain going from her back to her right side.  Pain radiates from her low back up to her neck.  Has been taking gabapentin which she gets from her PCP but nothing else for pain.  This feels like her chronic back pain but more severe.  She gets flares every 6 months or so.   Past Medical History:  Diagnosis Date  . Anxiety   . Bipolar and related disorder ([place])   . Blood transfusion without reported diagnosis   . Breast cancer ([place])    right lumpectemy and lymph node removal  . CHF (congestive heart failure) ([place])   . Chronic back pain   . Chronic kidney disease    "Stage IV" - states due to hypertension  . Depression   . GERD (gastroesophageal reflux disease)   . Hepatitis C   . Hypertension   . Left-sided weakness   . MRSA infection    of breast incision  . Opioid dependence ([place])    Has been treated in [facility] in past  . Substance abuse ([place])   . TIA (transient ischemic attack) 2020   P/w BP >230/120 and neurologic symptoms, presumed TIA    Patient Active Problem List   Diagnosis Date Noted  . Chronic diastolic CHF (congestive heart failure) ([place]) 05/09/2019  . GERD (gastroesophageal reflux disease) 05/09/2019  . Alcohol abuse 05/09/2019  . Hypertension 05/08/2019  . Bilateral low back pain with bilateral sciatica 01/05/2019  . Cyst of ovary   . Pre-operative cardiovascular examination  06/13/2018  . Dizziness 02/24/2018  . Left-sided weakness 02/24/2018  . Cigarette smoker 12/31/2017  . DOE (dyspnea on exertion) 12/30/2017  . Phlebitis after infusion 10/28/2017  . Hypertensive emergency 10/21/2017  . Microcytic anemia 10/21/2017  . Medication intolerance 09/06/2017  . Neuroleptic-induced tardive dyskinesia 09/06/2017  . Cancer ([place]) 09/06/2017  . Chronic low back pain 09/06/2017  . Grief reaction with prolonged bereavement 09/06/2017  . Opioid use disorder, severe, dependence ([place]) 08/27/2017  . Alcohol use disorder, severe, dependence ([place]) 08/27/2017  . Substance induced mood disorder ([place]) 08/13/2017  . AKI (acute kidney injury) ([place])   . MDD (major depressive disorder), recurrent severe, without psychosis ([place])   . Alcohol withdrawal ([place]) 08/09/2017  . Essential hypertension 07/06/2017  . Chronic kidney disease, stage III (moderate) 11/17/2016  . Depression with anxiety 08/21/2016  . Hx of bipolar disorder 01/31/2016  . History of breast cancer 02/13/2013  . Morbid obesity due to excess calories ([place]) 02/13/2013  . Hepatitis C virus infection cured after antiviral drug therapy 12/17/2012    Past Surgical History:  Procedure Laterality Date  . BACK SURGERY  1990  . BREAST SURGERY Right 2010   "breast cancer survivor" - states partial mastectomy and nodes  . COLONOSCOPY     [facility]  . ESOPHAGOGASTRODUODENOSCOPY  [facility] CHOLECYSTECTOMY  2002  . LAPAROSCOPIC INCISIONAL / UMBILICAL / VENTRAL HERNIA REPAIR  04/13/2018   with BARD 15x 96PR mesh (supraumbilical)  . LAPAROSCOPIC LYSIS OF ADHESIONS  07/12/2018   Procedure: LAPAROSCOPIC LYSIS OF ADHESIONS;  Surgeon: Isabel Caprice, MD;  Location: [facility] ORS;  Service: Gynecology;;  . MYOMECTOMY     x 2 prior to hysterectomy  . ROBOTIC ASSISTED BILATERAL SALPINGO OOPHERECTOMY Right 07/12/2018   Procedure: XI ROBOTIC ASSISTED RIGHT SALPINGO OOPHORECTOMY;  Surgeon:  Isabel Caprice, MD;  Location: [facility] ORS;  Service: Gynecology;  Laterality: Right;  . TOTAL ABDOMINAL HYSTERECTOMY     fibroids   . UPPER GASTROINTESTINAL ENDOSCOPY       OB History    Gravida  2   Para  0   Term      Preterm      AB  0   Living        SAB      TAB      Ectopic      Multiple      Live Births              Family History  Problem Relation Age of Onset  . Heart attack Mother   . Breast cancer Mother 52  . Dementia Mother   . Cancer Father 56       died of bleeding from kidneys  . Colon cancer Neg Hx   . Esophageal cancer Neg Hx   . Stomach cancer Neg Hx   . Rectal cancer Neg Hx     Social History   Tobacco Use  . Smoking status: Current Every Day Smoker    Packs/day: 0.25    Types: Cigarettes  . Smokeless tobacco: Never Used  . Tobacco comment: 4 cigs per day 01/2019  Substance Use Topics  . Alcohol use: No  . Drug use: Not Currently    Comment: h/o IVD use    Home Medications Prior to Admission medications   Medication Sig Start Date End Date Taking? Authorizing Provider  amLODipine (NORVASC) 10 MG tablet Take 1 tablet (10 mg total) by mouth daily. 09/13/19   Forrest Moron, MD  aspirin 81 MG chewable tablet Chew 1 tablet (81 mg total) by mouth daily. Patient not taking: Reported on 09/13/2019 05/11/19   Edwin Dada, MD  atorvastatin (LIPITOR) 40 MG tablet Take 1 tablet (40 mg total) by mouth daily at 6 PM. Patient not taking: Reported on 09/13/2019 05/11/19   Edwin Dada, MD  diclofenac Sodium (VOLTAREN) 1 % GEL Apply 4 g topically 4 (four) times daily. Patient not taking: Reported on 09/13/2019 06/04/19   Palumbo, April, MD  ferrous sulfate 325 (65 FE) MG tablet Take 1 tablet (325 mg total) by mouth daily with breakfast. 10/27/18   Forrest Moron, MD  FLUoxetine (PROZAC) 40 MG capsule Take 40 mg by mouth daily.  11/09/18   [provider]  furosemide (LASIX) 20 MG tablet Take 1 tablet (20 mg total) by  mouth daily. Patient not taking: Reported on 09/13/2019 05/11/19   Edwin Dada, MD  gabapentin (NEURONTIN) 800 MG tablet Take 1 tablet (800 mg total) by mouth 3 (three) times daily. 09/13/19   Forrest Moron, MD  hydrALAZINE (APRESOLINE) 25 MG tablet Take 1 tablet (25 mg total) by mouth 3 (three) times daily. 05/11/19   Danford, Suann Larry, MD  HYDROcodone-acetaminophen (NORCO/VICODIN) 5-325 MG tablet Take 1-2 tablets by  mouth every 6 (six) hours as needed. Patient not taking: Reported on 09/13/2019 06/01/19   Malvin Johns, MD  hydrOXYzine (VISTARIL) 25 MG capsule Take 25 mg by mouth 4 (four) times daily as needed for anxiety. 12/26/18   [provider]  INGREZZA 80 MG CAPS Take 80 mg by mouth daily. 10/15/17   [provider]  isosorbide mononitrate (IMDUR) 60 MG 24 hr tablet TAKE 1 TABLET (60 MG TOTAL) BY MOUTH DAILY. 12/27/18   Forrest Moron, MD  labetalol (NORMODYNE) 200 MG tablet Take 1 tablet (200 mg total) by mouth 2 (two) times daily. 05/11/19   Danford, Suann Larry, MD  lidocaine (LIDODERM) 5 % Place 1 patch onto the skin daily. Remove & Discard patch within 12 hours or as directed by MD 09/18/19   Sherwood Gambler, MD  Multiple Vitamins-Minerals (ADULT GUMMY) CHEW Chew 1 tablet by mouth daily. 07/21/17   [provider]  ondansetron (ZOFRAN) 4 MG tablet Take 1 tablet (4 mg total) by mouth every 8 (eight) hours as needed for nausea or vomiting. 05/11/19   Forrest Moron, MD  oxybutynin (DITROPAN-XL) 5 MG 24 hr tablet Take 1 tablet (5 mg total) by mouth at bedtime. 08/07/19   Forrest Moron, MD  pantoprazole (PROTONIX) 20 MG tablet Take 1 tablet (20 mg total) by mouth 2 (two) times daily. 09/13/19   Forrest Moron, MD  terazosin (HYTRIN) 2 MG capsule Take 1 capsule (2 mg total) by mouth at bedtime. 10/27/18   Forrest Moron, MD  varenicline (CHANTIX CONTINUING MONTH PAK) 1 MG tablet Take 1 tablet (1 mg total) by mouth 2 (two) times daily. 09/13/19    Forrest Moron, MD    Allergies    Abilify [aripiprazole], Remeron [mirtazapine], Trazodone and nefazodone, Flexeril [cyclobenzaprine], and Amoxicillin  Review of Systems   Review of Systems  Constitutional: Negative for fever.  Gastrointestinal: Positive for nausea. Negative for abdominal pain and vomiting.  Genitourinary: Negative for dysuria and hematuria.       No bowel/bladder incontinence.  Musculoskeletal: Positive for back pain.  Neurological: Negative for weakness and numbness.  All other systems reviewed and are negative.   Physical Exam Updated Vital Signs BP (!) 162/119   Pulse 73   Temp 99.1 F (37.3 C) (Oral)   Resp 20   Ht 5\' 2"  (1.575 m)   Wt 111.1 kg   SpO2 99%   BMI 44.81 kg/m   Physical Exam Vitals and nursing note reviewed.  Constitutional:      Appearance: She is well-developed. She is obese.  HENT:     Head: Normocephalic and atraumatic.     Right Ear: External ear normal.     Left Ear: External ear normal.     Nose: Nose normal.  Eyes:     General:        Right eye: No discharge.        Left eye: No discharge.  Cardiovascular:     Rate and Rhythm: Normal rate and regular rhythm.     Heart sounds: Normal heart sounds.  Pulmonary:     Effort: Pulmonary effort is normal.     Breath sounds: Normal breath sounds.  Abdominal:     Palpations: Abdomen is soft.     Tenderness: There is no abdominal tenderness.  Musculoskeletal:     Lumbar back: Tenderness present.  Skin:    General: Skin is warm and dry.  Neurological:     Mental Status: She is  alert.     Deep Tendon Reflexes:     Reflex Scores:      Patellar reflexes are 2+ on the right side and 2+ on the left side.      Achilles reflexes are 2+ on the right side and 2+ on the left side.    Comments: 5/5 strength in BLE. Normal gross sensation. Normal ambulation  Psychiatric:        Mood and Affect: Mood is not anxious.     ED Results / Procedures / Treatments   Labs (all labs  ordered are listed, but only abnormal results are displayed) Labs Reviewed - No data to display  EKG None  Radiology No results found.  Procedures Procedures (including critical care time)  Medications Ordered in ED Medications  sodium chloride flush (NS) 0.9 % injection 3 mL (has no administration in time range)  ketorolac (TORADOL) 30 MG/ML injection 30 mg (30 mg Intramuscular Given 09/18/19 1605)  HYDROmorphone (DILAUDID) injection 2 mg (2 mg Intramuscular Given 09/18/19 1605)    ED Course  I have reviewed the triage vital signs and the nursing notes.  Pertinent labs & imaging results that were available during my care of the patient were reviewed by me and considered in my medical decision making (see chart for details).    MDM Rules/Calculators/A&P                      Triage note indicates abdominal pain as well but the patient denies this.  Overall patient appears to be having acute exacerbation of chronic low back pain.  Has had diffuse degenerative disease.  My suspicion of acute spinal cord emergency is low.  Given IM medicines.  She is feeling better.  Recommend Tylenol and will add Lidoderm.  Follow-up with PCP and will give pain management resources.    Final Clinical Impression(s) / ED Diagnoses Final diagnoses:  Acute exacerbation of chronic low back pain    Rx / DC Orders ED Discharge Orders         Ordered    lidocaine (LIDODERM) 5 %  Every 24 hours     09/18/19 1703           Sherwood Gambler, MD 09/18/19 1704

## 2019-09-25 ENCOUNTER — Other Ambulatory Visit: Payer: Self-pay | Admitting: Family Medicine

## 2019-09-25 NOTE — Telephone Encounter (Signed)
Requested Prescriptions  Pending Prescriptions Disp Refills  . isosorbide mononitrate (IMDUR) 60 MG 24 hr tablet [Pharmacy Med Name: Isosorbide Mononitrate ER 60MG  TB24] 30 tablet     Sig: TAKE 1 TABLET (60 MG TOTAL) BY MOUTH DAILY.     Cardiovascular:  Nitrates Failed - 09/25/2019  3:02 PM      Failed - Last BP in normal range    BP Readings from Last 1 Encounters:  09/18/19 (!) 225/105         Passed - Last Heart Rate in normal range    Pulse Readings from Last 1 Encounters:  09/18/19 82         Passed - Valid encounter within last 12 months    Recent Outpatient Visits          1 week ago Essential hypertension   Primary Care at [facility], Arlie Solomons, MD   1 month ago Morbid obesity [facility])   Primary Care at [facility], Arlie Solomons, MD   2 months ago    Primary Care at [facility], Arlie Solomons, MD   5 months ago Medically complex patient   Primary Care at [place], MD   6 months ago Essential hypertension   Primary Care at [facility], Lilia Argue, MD      Future Appointments            In 1 week Forrest Moron, MD Primary Care at [place], [facility]

## 2019-10-02 ENCOUNTER — Other Ambulatory Visit: Payer: Self-pay

## 2019-10-02 ENCOUNTER — Telehealth: Payer: Medicare HMO | Admitting: Family Medicine

## 2019-10-02 NOTE — Patient Instructions (Signed)
° ° ° °  If you have lab work done today you will be contacted with your lab results within the next 2 weeks.  If you have not heard from us then please contact us. The fastest way to get your results is to register for My Chart. ° ° °IF you received an x-ray today, you will receive an invoice from [facility]. Please contact [facility] at [phone] with questions or concerns regarding your invoice.  ° °IF you received labwork today, you will receive an invoice from [facility]. Please contact [facility] at [phone] with questions or concerns regarding your invoice.  ° °Our billing staff will not be able to assist you with questions regarding bills from these companies. ° °You will be contacted with the lab results as soon as they are available. The fastest way to get your results is to activate your My Chart account. Instructions are located on the last page of this paperwork. If you have not heard from us regarding the results in 2 weeks, please contact this office. °  ° ° ° °

## 2019-10-17 LAB — ALDOSTERONE + RENIN ACTIVITY W/ RATIO
ALDO / PRA Ratio: 10 (ref 0.0–30.0)
Aldosterone: 3.9 ng/dL (ref 0.0–30.0)
PRA LC/MS/MS: 0.389 ng/mL/hr (ref 0.167–5.380)

## 2019-10-17 LAB — METANEPHRINES, PLASMA
Metanephrine, Free: 26.1 pg/mL (ref 0.0–88.0)
Normetanephrine, Free: 131.1 pg/mL (ref 0.0–191.8)

## 2019-11-04 ENCOUNTER — Other Ambulatory Visit: Payer: Self-pay | Admitting: Family Medicine

## 2019-11-08 ENCOUNTER — Ambulatory Visit (HOSPITAL_BASED_OUTPATIENT_CLINIC_OR_DEPARTMENT_OTHER): Admission: RE | Admit: 2019-11-08 | Payer: Medicare HMO | Source: Ambulatory Visit

## 2019-11-18 ENCOUNTER — Other Ambulatory Visit: Payer: Self-pay

## 2019-11-18 ENCOUNTER — Emergency Department (HOSPITAL_BASED_OUTPATIENT_CLINIC_OR_DEPARTMENT_OTHER)
Admission: EM | Admit: 2019-11-18 | Discharge: 2019-11-18 | Disposition: A | Discharge status: Home / Self Care | Payer: Medicare HMO | Attending: Emergency Medicine | Admitting: Emergency Medicine

## 2019-11-18 ENCOUNTER — Encounter (HOSPITAL_BASED_OUTPATIENT_CLINIC_OR_DEPARTMENT_OTHER): Payer: Self-pay | Admitting: Emergency Medicine

## 2019-11-18 ENCOUNTER — Emergency Department (HOSPITAL_BASED_OUTPATIENT_CLINIC_OR_DEPARTMENT_OTHER): Payer: Medicare HMO

## 2019-11-18 DIAGNOSIS — I5032 Chronic diastolic (congestive) heart failure: Secondary | ICD-10-CM | POA: Diagnosis not present

## 2019-11-18 DIAGNOSIS — R1013 Epigastric pain: Secondary | ICD-10-CM | POA: Diagnosis not present

## 2019-11-18 DIAGNOSIS — Z79899 Other long term (current) drug therapy: Secondary | ICD-10-CM | POA: Diagnosis not present

## 2019-11-18 DIAGNOSIS — N184 Chronic kidney disease, stage 4 (severe): Secondary | ICD-10-CM | POA: Diagnosis not present

## 2019-11-18 DIAGNOSIS — Z8673 Personal history of transient ischemic attack (TIA), and cerebral infarction without residual deficits: Secondary | ICD-10-CM | POA: Insufficient documentation

## 2019-11-18 DIAGNOSIS — Z9049 Acquired absence of other specified parts of digestive tract: Secondary | ICD-10-CM | POA: Insufficient documentation

## 2019-11-18 DIAGNOSIS — I130 Hypertensive heart and chronic kidney disease with heart failure and stage 1 through stage 4 chronic kidney disease, or unspecified chronic kidney disease: Secondary | ICD-10-CM | POA: Diagnosis not present

## 2019-11-18 DIAGNOSIS — F17210 Nicotine dependence, cigarettes, uncomplicated: Secondary | ICD-10-CM | POA: Insufficient documentation

## 2019-11-18 DIAGNOSIS — Z853 Personal history of malignant neoplasm of breast: Secondary | ICD-10-CM | POA: Insufficient documentation

## 2019-11-18 DIAGNOSIS — I700 Atherosclerosis of aorta: Secondary | ICD-10-CM | POA: Diagnosis not present

## 2019-11-18 LAB — LIPASE, BLOOD: Lipase: 44 U/L (ref 11–51)

## 2019-11-18 LAB — COMPREHENSIVE METABOLIC PANEL WITH GFR
ALT: 7 U/L (ref 0–44)
AST: 13 U/L — ABNORMAL LOW (ref 15–41)
Albumin: 3.4 g/dL — ABNORMAL LOW (ref 3.5–5.0)
Alkaline Phosphatase: 106 U/L (ref 38–126)
Anion gap: 9 (ref 5–15)
BUN: 25 mg/dL — ABNORMAL HIGH (ref 8–23)
CO2: 22 mmol/L (ref 22–32)
Calcium: 8.6 mg/dL — ABNORMAL LOW (ref 8.9–10.3)
Chloride: 109 mmol/L (ref 98–111)
Creatinine, Ser: 2.53 mg/dL — ABNORMAL HIGH (ref 0.44–1.00)
GFR calc Af Amer: 23 mL/min — ABNORMAL LOW
GFR calc non Af Amer: 20 mL/min — ABNORMAL LOW
Glucose, Bld: 112 mg/dL — ABNORMAL HIGH (ref 70–99)
Potassium: 4.7 mmol/L (ref 3.5–5.1)
Sodium: 140 mmol/L (ref 135–145)
Total Bilirubin: 0.3 mg/dL (ref 0.3–1.2)
Total Protein: 7 g/dL (ref 6.5–8.1)

## 2019-11-18 LAB — CBC WITH DIFFERENTIAL/PLATELET
Abs Immature Granulocytes: 0.03 10*3/uL (ref 0.00–0.07)
Basophils Absolute: 0 10*3/uL (ref 0.0–0.1)
Basophils Relative: 0 %
Eosinophils Absolute: 0.2 10*3/uL (ref 0.0–0.5)
Eosinophils Relative: 3 %
HCT: 33.8 % — ABNORMAL LOW (ref 36.0–46.0)
Hemoglobin: 10.6 g/dL — ABNORMAL LOW (ref 12.0–15.0)
Immature Granulocytes: 0 %
Lymphocytes Relative: 24 %
Lymphs Abs: 1.8 10*3/uL (ref 0.7–4.0)
MCH: 27.7 pg (ref 26.0–34.0)
MCHC: 31.4 g/dL (ref 30.0–36.0)
MCV: 88.3 fL (ref 80.0–100.0)
Monocytes Absolute: 0.5 10*3/uL (ref 0.1–1.0)
Monocytes Relative: 6 %
Neutro Abs: 5.2 10*3/uL (ref 1.7–7.7)
Neutrophils Relative %: 67 %
Platelets: 268 10*3/uL (ref 150–400)
RBC: 3.83 MIL/uL — ABNORMAL LOW (ref 3.87–5.11)
RDW: 15 % (ref 11.5–15.5)
WBC: 7.7 10*3/uL (ref 4.0–10.5)
nRBC: 0 % (ref 0.0–0.2)

## 2019-11-18 MED ORDER — SUCRALFATE 1 G PO TABS: 1.0000 g | ORAL_TABLET | Freq: Three times a day (TID) | ORAL | 0 refills | Status: DC

## 2019-11-18 MED ORDER — ONDANSETRON HCL 4 MG/2ML IJ SOLN
4.0000 mg | Freq: Once | INTRAMUSCULAR | Status: AC
  Administered 2019-11-18: 4 mg via INTRAVENOUS
  Filled 2019-11-18: qty 2

## 2019-11-18 MED ORDER — ALUM & MAG HYDROXIDE-SIMETH 200-200-20 MG/5ML PO SUSP
30.0000 mL | Freq: Once | ORAL | Status: AC
  Administered 2019-11-18: 30 mL via ORAL
  Filled 2019-11-18: qty 30

## 2019-11-18 MED ORDER — HYDROCODONE-ACETAMINOPHEN 5-325 MG PO TABS: 1.0000 | ORAL_TABLET | Freq: Four times a day (QID) | ORAL | 0 refills | Status: DC | PRN

## 2019-11-18 MED ORDER — OMEPRAZOLE 20 MG PO CPDR: 20.0000 mg | DELAYED_RELEASE_CAPSULE | Freq: Every day | ORAL | 0 refills | Status: DC

## 2019-11-18 MED ORDER — HYDROMORPHONE HCL 1 MG/ML IJ SOLN
1.0000 mg | Freq: Once | INTRAMUSCULAR | Status: AC
  Administered 2019-11-18: 1 mg via INTRAVENOUS
  Filled 2019-11-18: qty 1

## 2019-11-18 MED ORDER — PANTOPRAZOLE SODIUM 40 MG IV SOLR
40.0000 mg | Freq: Once | INTRAVENOUS | Status: AC
  Administered 2019-11-18: 40 mg via INTRAVENOUS
  Filled 2019-11-18: qty 40

## 2019-11-18 MED ORDER — MORPHINE SULFATE (PF) 4 MG/ML IV SOLN
4.0000 mg | Freq: Once | INTRAVENOUS | Status: AC
  Administered 2019-11-18: 4 mg via INTRAVENOUS
  Filled 2019-11-18: qty 1

## 2019-11-18 MED ORDER — LIDOCAINE VISCOUS HCL 2 % MT SOLN
15.0000 mL | Freq: Once | OROMUCOSAL | Status: AC
  Administered 2019-11-18: 15 mL via ORAL
  Filled 2019-11-18: qty 15

## 2019-11-18 NOTE — ED Triage Notes (Signed)
Upper abd pain x 1 week. Denies N/V

## 2019-11-18 NOTE — ED Provider Notes (Signed)
[facility] EMERGENCY DEPARTMENT Provider Note   CSN: [id number] Arrival date & time: 11/18/19  1740     History Chief Complaint  Patient presents with  . Abdominal Pain    Sherry [name] is a 64 y.o. female.  The history is provided by the patient.  Abdominal Pain Pain location:  Epigastric Pain quality: aching, cramping, sharp and shooting   Pain radiates to:  Back Pain severity:  Severe Onset quality:  Gradual Duration:  3 days Timing:  Constant Progression:  Worsening Chronicity:  New Context: not alcohol use, not diet changes, not eating, not recent illness, not sick contacts and not suspicious food intake   Relieved by:  Nothing Worsened by:  Nothing Ineffective treatments:  Antacids, acetaminophen and OTC medications Associated symptoms: anorexia, nausea and shortness of breath   Associated symptoms: no constipation, no diarrhea, no dysuria, no fever and no vomiting   Associated symptoms comment:  No NSAID use, alcohol use or history of ulcers.  She does not take stomach medication at home regularly. Risk factors: no alcohol abuse   Risk factors comment:  CHF, CKD stage IV, bipolar disease, opioid use disorder, hepatitis C treated with antiviral therapy      Past Medical History:  Diagnosis Date  . Anxiety   . Bipolar and related disorder ([place])   . Blood transfusion without reported diagnosis   . Breast cancer ([place])    right lumpectemy and lymph node removal  . CHF (congestive heart failure) ([place])   . Chronic back pain   . Chronic kidney disease    "Stage IV" - states due to hypertension  . Depression   . GERD (gastroesophageal reflux disease)   . Hepatitis C   . Hypertension   . Left-sided weakness   . MRSA infection    of breast incision  . Opioid dependence ([place])    Has been treated in [facility] in past  . Substance abuse ([place])   . TIA (transient ischemic attack) 2020   P/w BP >230/120 and neurologic symptoms, presumed TIA     Patient Active Problem List   Diagnosis Date Noted  . Chronic diastolic CHF (congestive heart failure) ([place]) 05/09/2019  . GERD (gastroesophageal reflux disease) 05/09/2019  . Alcohol abuse 05/09/2019  . Hypertension 05/08/2019  . Bilateral low back pain with bilateral sciatica 01/05/2019  . Cyst of ovary   . Pre-operative cardiovascular examination 06/13/2018  . Dizziness 02/24/2018  . Left-sided weakness 02/24/2018  . Cigarette smoker 12/31/2017  . DOE (dyspnea on exertion) 12/30/2017  . Phlebitis after infusion 10/28/2017  . Hypertensive emergency 10/21/2017  . Microcytic anemia 10/21/2017  . Medication intolerance 09/06/2017  . Neuroleptic-induced tardive dyskinesia 09/06/2017  . Cancer ([place]) 09/06/2017  . Chronic low back pain 09/06/2017  . Grief reaction with prolonged bereavement 09/06/2017  . Opioid use disorder, severe, dependence ([place]) 08/27/2017  . Alcohol use disorder, severe, dependence ([place]) 08/27/2017  . Substance induced mood disorder ([place]) 08/13/2017  . AKI (acute kidney injury) ([place])   . MDD (major depressive disorder), recurrent severe, without psychosis ([place])   . Alcohol withdrawal ([place]) 08/09/2017  . Essential hypertension 07/06/2017  . Chronic kidney disease, stage III (moderate) 11/17/2016  . Depression with anxiety 08/21/2016  . Hx of bipolar disorder 01/31/2016  . History of breast cancer 02/13/2013  . Morbid obesity due to excess calories ([place]) 02/13/2013  . Hepatitis C virus infection cured after antiviral drug therapy 12/17/2012    Past Surgical History:  Procedure Laterality Date  .  BACK SURGERY  1990  . BREAST SURGERY Right 2010   "breast cancer survivor" - states partial mastectomy and nodes  . COLONOSCOPY     [facility]  . ESOPHAGOGASTRODUODENOSCOPY     [facility]  . LAPAROSCOPIC CHOLECYSTECTOMY  2002  . LAPAROSCOPIC INCISIONAL / UMBILICAL / VENTRAL HERNIA REPAIR  04/13/2018   with BARD 15x 20cm mesh  (supraumbilical)  . LAPAROSCOPIC LYSIS OF ADHESIONS  07/12/2018   Procedure: LAPAROSCOPIC LYSIS OF ADHESIONS;  Surgeon: Isabel Caprice, MD;  Location: [facility];  Service: Gynecology;;  . MYOMECTOMY     x 2 prior to hysterectomy  . ROBOTIC ASSISTED BILATERAL SALPINGO OOPHERECTOMY Right 07/12/2018   Procedure: XI ROBOTIC ASSISTED RIGHT SALPINGO OOPHORECTOMY;  Surgeon: Isabel Caprice, MD;  Location: [facility];  Service: Gynecology;  Laterality: Right;  . TOTAL ABDOMINAL HYSTERECTOMY     fibroids   . UPPER GASTROINTESTINAL ENDOSCOPY       OB History    Gravida  2   Para  0   Term      Preterm      AB  0   Living        SAB      TAB      Ectopic      Multiple      Live Births              Family History  Problem Relation Age of Onset  . Heart attack Mother   . Breast cancer Mother 57  . Dementia Mother   . Cancer Father 55       died of bleeding from kidneys  . Colon cancer Neg Hx   . Esophageal cancer Neg Hx   . Stomach cancer Neg Hx   . Rectal cancer Neg Hx     Social History   Tobacco Use  . Smoking status: Current Every Day Smoker    Packs/day: 0.25    Types: Cigarettes  . Smokeless tobacco: Never Used  . Tobacco comment: 4 cigs per day 01/2019  Substance Use Topics  . Alcohol use: No  . Drug use: Not Currently    Comment: h/o IVD use    Home Medications Prior to Admission medications   Medication Sig Start Date End Date Taking? Authorizing Provider  amLODipine (NORVASC) 10 MG tablet Take 1 tablet (10 mg total) by mouth daily. 09/13/19   Forrest Moron, MD  aspirin 81 MG chewable tablet Chew 1 tablet (81 mg total) by mouth daily. Patient not taking: Reported on 09/13/2019 05/11/19   Edwin Dada, MD  atorvastatin (LIPITOR) 40 MG tablet Take 1 tablet (40 mg total) by mouth daily at 6 PM. Patient not taking: Reported on 09/13/2019 05/11/19   Edwin Dada, MD  diclofenac Sodium (VOLTAREN) 1 % GEL Apply 4 g topically 4 (four) times  daily. Patient not taking: Reported on 09/13/2019 06/04/19   Palumbo, April, MD  ferrous sulfate 325 (65 FE) MG tablet Take 1 tablet (325 mg total) by mouth daily with breakfast. 10/27/18   Forrest Moron, MD  FLUoxetine (PROZAC) 40 MG capsule Take 40 mg by mouth daily.  11/09/18   [provider]  furosemide (LASIX) 20 MG tablet Take 1 tablet (20 mg total) by mouth daily. Patient not taking: Reported on 09/13/2019 05/11/19   Edwin Dada, MD  gabapentin (NEURONTIN) 800 MG tablet Take 1 tablet (800 mg total) by mouth 3 (three) times daily. 09/13/19   Nolon Rod,  Zoe A, MD  hydrALAZINE (APRESOLINE) 25 MG tablet Take 1 tablet (25 mg total) by mouth 3 (three) times daily. 05/11/19   Danford, Suann Larry, MD  HYDROcodone-acetaminophen (NORCO/VICODIN) 5-325 MG tablet Take 1-2 tablets by mouth every 6 (six) hours as needed. Patient not taking: Reported on 09/13/2019 06/01/19   Malvin Johns, MD  hydrOXYzine (VISTARIL) 25 MG capsule Take 25 mg by mouth 4 (four) times daily as needed for anxiety. 12/26/18   [provider]  INGREZZA 80 MG CAPS Take 80 mg by mouth daily. 10/15/17   [provider]  isosorbide mononitrate (IMDUR) 60 MG 24 hr tablet TAKE 1 TABLET (60 MG TOTAL) BY MOUTH DAILY. 09/25/19   Forrest Moron, MD  labetalol (NORMODYNE) 200 MG tablet Take 1 tablet (200 mg total) by mouth 2 (two) times daily. 05/11/19   Danford, Suann Larry, MD  lidocaine (LIDODERM) 5 % Place 1 patch onto the skin daily. Remove & Discard patch within 12 hours or as directed by MD 09/18/19   Sherwood Gambler, MD  Multiple Vitamins-Minerals (ADULT GUMMY) CHEW Chew 1 tablet by mouth daily. 07/21/17   [provider]  ondansetron (ZOFRAN) 4 MG tablet Take 1 tablet (4 mg total) by mouth every 8 (eight) hours as needed for nausea or vomiting. 05/11/19   Forrest Moron, MD  oxybutynin (DITROPAN-XL) 5 MG 24 hr tablet TAKE 1 TABLET(5 MG) BY MOUTH AT BEDTIME 11/04/19   Delia Chimes A, MD   pantoprazole (PROTONIX) 20 MG tablet Take 1 tablet (20 mg total) by mouth 2 (two) times daily. 09/13/19   Forrest Moron, MD  terazosin (HYTRIN) 2 MG capsule Take 1 capsule (2 mg total) by mouth at bedtime. 10/27/18   Forrest Moron, MD  varenicline (CHANTIX CONTINUING MONTH PAK) 1 MG tablet Take 1 tablet (1 mg total) by mouth 2 (two) times daily. 09/13/19   Forrest Moron, MD    Allergies    Abilify [aripiprazole], Remeron [mirtazapine], Trazodone and nefazodone, Flexeril [cyclobenzaprine], and Amoxicillin  Review of Systems   Review of Systems  Constitutional: Negative for fever.  Respiratory: Positive for shortness of breath.   Gastrointestinal: Positive for abdominal pain, anorexia and nausea. Negative for constipation, diarrhea and vomiting.  Genitourinary: Negative for dysuria.  All other systems reviewed and are negative.   Physical Exam Updated Vital Signs BP (!) 204/114 (BP Location: Right Arm)   Pulse 82   Temp 98 F (36.7 C) (Oral)   Resp (!) 24   Ht 5\' 2"  (1.575 m)   Wt 111.1 kg   SpO2 95%   BMI 44.81 kg/m   Physical Exam Vitals and nursing note reviewed.  Constitutional:      Appearance: She is well-developed. She is obese.     Comments: Appears uncomfortable.  HENT:     Head: Normocephalic and atraumatic.  Eyes:     Pupils: Pupils are equal, round, and reactive to light.  Cardiovascular:     Rate and Rhythm: Normal rate and regular rhythm.     Heart sounds: Normal heart sounds. No murmur. No friction rub.  Pulmonary:     Effort: Pulmonary effort is normal. Tachypnea present.     Breath sounds: Normal breath sounds. No wheezing or rales.  Abdominal:     General: Bowel sounds are normal. There is no distension.     Palpations: Abdomen is soft.     Tenderness: There is abdominal tenderness in the epigastric area and periumbilical area. There is guarding. There  is no right CVA tenderness, left CVA tenderness or rebound.  Musculoskeletal:        General:  No tenderness. Normal range of motion.     Comments: No edema  Skin:    General: Skin is warm and dry.     Findings: No rash.  Neurological:     Mental Status: She is alert and oriented to person, place, and time.     Cranial Nerves: No cranial nerve deficit.  Psychiatric:        Mood and Affect: Mood normal.        Behavior: Behavior normal.        Thought Content: Thought content normal.     ED Results / Procedures / Treatments   Labs (all labs ordered are listed, but only abnormal results are displayed) Labs Reviewed  CBC WITH DIFFERENTIAL/PLATELET - Abnormal; Notable for the following components:      Result Value   RBC 3.83 (*)    Hemoglobin 10.6 (*)    HCT 33.8 (*)    All other components within normal limits  COMPREHENSIVE METABOLIC PANEL - Abnormal; Notable for the following components:   Glucose, Bld 112 (*)    BUN 25 (*)    Creatinine, Ser 2.53 (*)    Calcium 8.6 (*)    Albumin 3.4 (*)    AST 13 (*)    GFR calc non Af Amer 20 (*)    GFR calc Af Amer 23 (*)    All other components within normal limits  LIPASE, BLOOD    EKG EKG Interpretation  Date/Time:  Saturday Nov 18 2019 18:27:18 EDT Ventricular Rate:  74 PR Interval:    QRS Duration: 77 QT Interval:  427 QTC Calculation: 474 R Axis:   62 Text Interpretation: Sinus rhythm Anteroseptal infarct, old Minimal ST depression, inferior leads Baseline wander No significant change since last tracing Confirmed by Blanchie Dessert [phone]) on 11/18/2019 7:15:51 PM   Radiology CT ABDOMEN PELVIS WO CONTRAST  Result Date: 11/18/2019 CLINICAL DATA:  Epigastric pain for 1 week EXAM: CT ABDOMEN AND PELVIS WITHOUT CONTRAST TECHNIQUE: Multidetector CT imaging of the abdomen and pelvis was performed following the standard protocol without IV contrast. COMPARISON:  CT abdomen pelvis dated 02/06/2019 FINDINGS: Lower chest: The heart is enlarged. Hepatobiliary: No focal liver abnormality is seen. Status post cholecystectomy.  No biliary dilatation. Pancreas: Unremarkable. No pancreatic ductal dilatation or surrounding inflammatory changes. Spleen: Normal in size without focal abnormality. Adrenals/Urinary Tract: Bilateral benign adrenal adenomas are unchanged. A 4.6 cm left renal cyst and a few hyperdense renal lesions are redemonstrated. Otherwise, the kidneys are normal, without renal calculi, focal lesion, or hydronephrosis. Bladder is decompressed. Stomach/Bowel: Stomach is within normal limits. There is colonic diverticulosis without evidence of diverticulitis. Appendix appears normal. No evidence of bowel wall thickening, distention, or inflammatory changes. Vascular/Lymphatic: Aortic atherosclerosis. No enlarged abdominal or pelvic lymph nodes. Reproductive: Status post hysterectomy. No adnexal masses. Other: A mesh is seen across the ventral abdominal wall. No abdominal wall hernia. No abdominopelvic ascites. Musculoskeletal: Degenerative changes are seen in the spine and hips. IMPRESSION: 1. No acute findings in the abdomen or pelvis. No findings to explain the patient's symptoms. Aortic Atherosclerosis (ICD10-I70.0). Electronically Signed   By: Zerita Boers M.D.   On: 11/18/2019 20:36    Procedures Procedures (including critical care time)  Medications Ordered in ED Medications  ondansetron (ZOFRAN) injection 4 mg (has no administration in time range)  morphine 4 MG/ML injection 4 mg (has  no administration in time range)  alum & mag hydroxide-simeth (MAALOX/MYLANTA) 200-200-20 MG/5ML suspension 30 mL (has no administration in time range)    And  lidocaine (XYLOCAINE) 2 % viscous mouth solution 15 mL (has no administration in time range)    ED Course  I have reviewed the triage vital signs and the nursing notes.  Pertinent labs & imaging results that were available during my care of the patient were reviewed by me and considered in my medical decision making (see chart for details).    MDM  Rules/Calculators/A&P                      64 year old female presenting with 3 days of worsening epigastric and periumbilical pain.  Patient appears uncomfortable here and has significant epigastric pain on my exam.  She denies a history of NSAID or alcohol use.  She has a prior history of alcohol abuse but states she does not drink at all.  She denies any bowel changes but has had nausea without vomiting.  Patient has had no recent medication changes but has tried using Pepto-Bismol and gabapentin which has not helped with her pain.  Concern for gastritis versus perforation versus PUD versus pancreatitis.  Patient is status post cholecystectomy low suspicion for acute hepatitis.  Patient has been treated for hepatitis C.  No significant lower abdominal pain concerning for diverticulitis, urinary or vaginal pathology.  Low suspicion for appendicitis.  Patient given IV fluids and pain control.  Given patient's upper abdominal pain, hypertension and shortness of breath related to the pain we will do an EKG to ensure no acute atypical ACS symptoms.  9:42 PM Patient's EKG without acute changes, CMP, lipase and CBC without acute findings.  Patient has chronic kidney disease and 2.5 is at her baseline.  Patient's CT without significant findings today.  After Protonix, GI cocktail and pain medication patient is feeling better.  Still concern for possible gastritis or peptic ulcer disease.  Will start patient on omeprazole and Carafate.  Did encourage patient to follow-up closely with her doctor next week if you are still having symptoms that she may need GI follow-up and endoscopy.  Patient has been hypertensive here but has not taken her evening medications yet which she usually takes 3.  She did take all 5 blood pressure medications this morning.  Repeat blood pressure is 186/85.  She reports she will take her medicine when she gets home. MDM Number of Diagnoses or Management Options Epigastric pain: new,  needed workup   Amount and/or Complexity of Data Reviewed Clinical lab tests: ordered and reviewed Tests in the radiology section of CPT: ordered and reviewed Tests in the medicine section of CPT: ordered and reviewed Decide to obtain previous medical records or to obtain history from someone other than the patient: yes Obtain history from someone other than the patient: no Review and summarize past medical records: yes Discuss the patient with other providers: no Independent visualization of images, tracings, or specimens: yes  Risk of Complications, Morbidity, and/or Mortality Presenting problems: moderate Diagnostic procedures: low Management options: low  Patient Progress Patient progress: stable    Final Clinical Impression(s) / ED Diagnoses Final diagnoses:  Epigastric pain    Rx / DC Orders ED Discharge Orders         Ordered    HYDROcodone-acetaminophen (NORCO/VICODIN) 5-325 MG tablet  Every 6 hours PRN     11/18/19 2120    sucralfate (CARAFATE) 1 g tablet  3 times daily with meals & bedtime     11/18/19 2120    omeprazole (PRILOSEC) 20 MG capsule  Daily     11/18/19 2120           Blanchie Dessert, MD 11/18/19 2143

## 2019-11-18 NOTE — ED Notes (Signed)
PT resting quietly watching TV. No emesis since arrival.

## 2019-11-18 NOTE — ED Notes (Signed)
Pt back from CT

## 2019-11-27 ENCOUNTER — Ambulatory Visit (INDEPENDENT_AMBULATORY_CARE_PROVIDER_SITE_OTHER): Payer: Medicare HMO | Admitting: Family Medicine

## 2019-11-27 ENCOUNTER — Encounter: Payer: Self-pay | Admitting: Family Medicine

## 2019-11-27 ENCOUNTER — Other Ambulatory Visit: Payer: Self-pay

## 2019-11-27 VITALS — BP 166/66 | HR 88 | Temp 98.2°F | Ht 62.0 in | Wt 241.6 lb

## 2019-11-27 DIAGNOSIS — N184 Chronic kidney disease, stage 4 (severe): Secondary | ICD-10-CM | POA: Diagnosis not present

## 2019-11-27 DIAGNOSIS — K2970 Gastritis, unspecified, without bleeding: Secondary | ICD-10-CM | POA: Diagnosis not present

## 2019-11-27 DIAGNOSIS — I130 Hypertensive heart and chronic kidney disease with heart failure and stage 1 through stage 4 chronic kidney disease, or unspecified chronic kidney disease: Secondary | ICD-10-CM | POA: Diagnosis not present

## 2019-11-27 DIAGNOSIS — R6889 Other general symptoms and signs: Secondary | ICD-10-CM | POA: Diagnosis not present

## 2019-11-27 DIAGNOSIS — R1013 Epigastric pain: Secondary | ICD-10-CM

## 2019-11-27 DIAGNOSIS — I10 Essential (primary) hypertension: Secondary | ICD-10-CM

## 2019-11-27 DIAGNOSIS — I5032 Chronic diastolic (congestive) heart failure: Secondary | ICD-10-CM | POA: Diagnosis not present

## 2019-11-27 DIAGNOSIS — Z6841 Body Mass Index (BMI) 40.0 and over, adult: Secondary | ICD-10-CM | POA: Diagnosis not present

## 2019-11-27 MED ORDER — PANTOPRAZOLE SODIUM 20 MG PO TBEC: 20.0000 mg | DELAYED_RELEASE_TABLET | Freq: Two times a day (BID) | ORAL | 1 refills | Status: DC

## 2019-11-27 NOTE — Progress Notes (Signed)
Subjective:  Patient ID: Sherry [name], female    DOB: [birth year]  Age: 64 y.o. MRN: [id number]  CC:  Chief Complaint  Patient presents with  . Transitions Of Care  . Abdominal Pain    ongoing     HPI Sherry [name] presents for   New patient to me for transition of care, previously followed by Dr. Nolon Rod.  Also here for follow-up of abdominal pain.  10 minutes chart review  History of chronic kidney disease, depression with anxiety/bipolar disorder, neuroleptic induced tardive dyskinesia, chronic low back pain, hepatitis C, hypertension, history of breast cancer, GERD, tobacco use, alcohol abuse, chronic diastolic CHF.  She is followed by nephrology, Dr. Carolin Sicks,  cardiology Dr. Geraldo Pitter.  GI: [facility] Last visit/telemedicine visit with Dr. Nolon Rod on March 3.  Compliant with blood pressure medications at that time, planned on upcoming nephrology appointment at the end of that month.  Smoking was improving, down to 5 cigarettes a day with Chantix.  Nicotine addiction:  Highest pack per day, now down to 4 cigarettes per day. Slight taste in mouth - ? Metallic taste with Chantix.   [facility] ER note reviewed from May 8.  Epigastric abdominal pain for 3 days.  Hemoglobin 10.6, previous range 10.9-11.6.  Creatinine 2.53, previous 2 readings in 2020 2.14-2.93.  Normal lipase. CT abdomen pelvis without acute findings in the abdomen or pelvis, no findings to explain patient's symptoms at that time.  Protonix 40 mg IV given, GI cocktail with improvement of symptoms.  Possible gastritis versus peptic ulcer disease, start on omeprazole and Carafate.  She was hypertensive in the ER but had not yet taken her evening medications. Endoscopy January 25, 2019, Dr. Bryan Lemma, gastritis noted  Per pt, GI cocktail did not help pain, just numbed mouth.  Has been taking carafate QID and is taking omeprazole once per day (she was on protonix 20mg  BID, changed to omeprazole  form ER).  Pain about the same. No fever, n/v. No diarrhea, just feels like needs to have BM after meals.  Pain about 6 days. Similar pain in past.  4 cigs per day.  Mountain dew - 1 liter per day - stopped past few days. 1 cup coffee per day.  No blood in stool. Some dark stool with iron pill.  Upper stomach pain, no CP, no new dyspnea   No urinary symptoms - urinating normally.   Hypertension: With chronic kidney disease, CHF as above. Was in pain and not taken evening med in ER.  Home readings: 130/85 at home.  No missed doses of meds. - norvasc 10mg , not sure she is still on lasix. Has appt with nephrology June 3rd. (last appt in 05/2019 per notes). Taking hydralazine 50mg  tid, labetalol 200mg  tid, terazosin 2mg  QHS.  No recent alcohol - none in 15 years.  BP Readings from Last 3 Encounters:  11/27/19 (!) 166/66  11/18/19 (!) 186/85  09/18/19 (!) 225/105   Lab Results  Component Value Date   CREATININE 2.53 (H) 11/18/2019         History Patient Active Problem List   Diagnosis Date Noted  . Chronic diastolic CHF (congestive heart failure) ([place]) 05/09/2019  . GERD (gastroesophageal reflux disease) 05/09/2019  . Alcohol abuse 05/09/2019  . Hypertension 05/08/2019  . Bilateral low back pain with bilateral sciatica 01/05/2019  . Cyst of ovary   . Pre-operative cardiovascular examination 06/13/2018  . Dizziness 02/24/2018  . Left-sided weakness 02/24/2018  . Cigarette smoker 12/31/2017  .  DOE (dyspnea on exertion) 12/30/2017  . Phlebitis after infusion 10/28/2017  . Hypertensive emergency 10/21/2017  . Microcytic anemia 10/21/2017  . Medication intolerance 09/06/2017  . Neuroleptic-induced tardive dyskinesia 09/06/2017  . Cancer ([place]) 09/06/2017  . Chronic low back pain 09/06/2017  . Grief reaction with prolonged bereavement 09/06/2017  . Opioid use disorder, severe, dependence ([place]) 08/27/2017  . Alcohol use disorder, severe, dependence ([place]) 08/27/2017  .  Substance induced mood disorder ([place]) 08/13/2017  . AKI (acute kidney injury) ([place])   . MDD (major depressive disorder), recurrent severe, without psychosis ([place])   . Alcohol withdrawal ([place]) 08/09/2017  . Essential hypertension 07/06/2017  . Chronic kidney disease, stage III (moderate) 11/17/2016  . Depression with anxiety 08/21/2016  . Hx of bipolar disorder 01/31/2016  . History of breast cancer 02/13/2013  . Morbid obesity due to excess calories ([place]) 02/13/2013  . Hepatitis C virus infection cured after antiviral drug therapy 12/17/2012   Past Medical History:  Diagnosis Date  . Anxiety   . Bipolar and related disorder ([place])   . Blood transfusion without reported diagnosis   . Breast cancer ([place])    right lumpectemy and lymph node removal  . CHF (congestive heart failure) ([place])   . Chronic back pain   . Chronic kidney disease    "Stage IV" - states due to hypertension  . Depression   . GERD (gastroesophageal reflux disease)   . Hepatitis C   . Hypertension   . Left-sided weakness   . MRSA infection    of breast incision  . Opioid dependence ([place])    Has been treated in [facility] in past  . Substance abuse ([place])   . TIA (transient ischemic attack) 2020   P/w BP >230/120 and neurologic symptoms, presumed TIA   Past Surgical History:  Procedure Laterality Date  . BACK SURGERY  1990  . BREAST SURGERY Right 2010   "breast cancer survivor" - states partial mastectomy and nodes  . COLONOSCOPY     [facility]  . ESOPHAGOGASTRODUODENOSCOPY     [facility]  . LAPAROSCOPIC CHOLECYSTECTOMY  2002  . LAPAROSCOPIC INCISIONAL / UMBILICAL / VENTRAL HERNIA REPAIR  04/13/2018   with BARD 15x 23JS mesh (supraumbilical)  . LAPAROSCOPIC LYSIS OF ADHESIONS  07/12/2018   Procedure: LAPAROSCOPIC LYSIS OF ADHESIONS;  Surgeon: Isabel Caprice, MD;  Location: [facility];  Service: Gynecology;;  . MYOMECTOMY     x 2 prior to hysterectomy  . ROBOTIC ASSISTED BILATERAL  SALPINGO OOPHERECTOMY Right 07/12/2018   Procedure: XI ROBOTIC ASSISTED RIGHT SALPINGO OOPHORECTOMY;  Surgeon: Isabel Caprice, MD;  Location: [facility];  Service: Gynecology;  Laterality: Right;  . TOTAL ABDOMINAL HYSTERECTOMY     fibroids   . UPPER GASTROINTESTINAL ENDOSCOPY     Allergies  Allergen Reactions  . Abilify [Aripiprazole] Other (See Comments)    Tardive dyskinesia Oral  . Remeron [Mirtazapine] Other (See Comments)    Wgt stimulation /gain, Dizziness, Patient says "can tolerate"  . Trazodone And Nefazodone Other (See Comments)    Nightmares/sleep diturbance  . Flexeril [Cyclobenzaprine] Other (See Comments)    Pt states Flexeril makes her feel depressed   . Amoxicillin Diarrhea and Other (See Comments)    NOTE the patient has had PCN WITHOUT reaction Has patient had a PCN reaction causing immediate rash, facial/tongue/throat swelling, SOB or lightheadedness with hypotension: No Has patient had a PCN reaction causing severe rash involving mucus membranes or skin necrosis: No Has patient had  a PCN reaction that required hospitalization: No Has patient had a PCN reaction occurring within the last 10 years: No If all of the above answers are "NO", then may proceed with Cephalosporin use.    Prior to Admission medications   Medication Sig Start Date End Date Taking? Authorizing Provider  amLODipine (NORVASC) 10 MG tablet Take 1 tablet (10 mg total) by mouth daily. 09/13/19  Yes Forrest Moron, MD  ferrous sulfate 325 (65 FE) MG tablet Take 1 tablet (325 mg total) by mouth daily with breakfast. 10/27/18  Yes Stallings, Zoe A, MD  FLUoxetine (PROZAC) 40 MG capsule Take 40 mg by mouth daily.  11/09/18  Yes [provider]  gabapentin (NEURONTIN) 800 MG tablet Take 1 tablet (800 mg total) by mouth 3 (three) times daily. 09/13/19  Yes Delia Chimes A, MD  hydrALAZINE (APRESOLINE) 25 MG tablet Take 1 tablet (25 mg total) by mouth 3 (three) times daily. 05/11/19  Yes Danford,  Suann Larry, MD  hydrOXYzine (VISTARIL) 25 MG capsule Take 25 mg by mouth 4 (four) times daily as needed for anxiety. 12/26/18  Yes [provider]  INGREZZA 80 MG CAPS Take 80 mg by mouth daily. 10/15/17  Yes [provider]  isosorbide mononitrate (IMDUR) 60 MG 24 hr tablet TAKE 1 TABLET (60 MG TOTAL) BY MOUTH DAILY. 09/25/19  Yes Stallings, Zoe A, MD  labetalol (NORMODYNE) 200 MG tablet Take 1 tablet (200 mg total) by mouth 2 (two) times daily. 05/11/19  Yes Danford, Suann Larry, MD  Multiple Vitamins-Minerals (ADULT GUMMY) CHEW Chew 1 tablet by mouth daily. 07/21/17  Yes [provider]  ondansetron (ZOFRAN) 4 MG tablet Take 1 tablet (4 mg total) by mouth every 8 (eight) hours as needed for nausea or vomiting. 05/11/19  Yes Stallings, Zoe A, MD  oxybutynin (DITROPAN-XL) 5 MG 24 hr tablet TAKE 1 TABLET(5 MG) BY MOUTH AT BEDTIME 11/04/19  Yes Stallings, Zoe A, MD  pantoprazole (PROTONIX) 20 MG tablet Take 1 tablet (20 mg total) by mouth 2 (two) times daily. 09/13/19  Yes Stallings, Zoe A, MD  sucralfate (CARAFATE) 1 g tablet Take 1 tablet (1 g total) by mouth 4 (four) times daily -  with meals and at bedtime. 11/18/19  Yes Plunkett, Loree Fee, MD  terazosin (HYTRIN) 2 MG capsule Take 1 capsule (2 mg total) by mouth at bedtime. 10/27/18  Yes Forrest Moron, MD  varenicline (CHANTIX CONTINUING MONTH PAK) 1 MG tablet Take 1 tablet (1 mg total) by mouth 2 (two) times daily. 09/13/19  Yes Forrest Moron, MD  aspirin 81 MG chewable tablet Chew 1 tablet (81 mg total) by mouth daily. Patient not taking: Reported on 09/13/2019 05/11/19   Edwin Dada, MD  atorvastatin (LIPITOR) 40 MG tablet Take 1 tablet (40 mg total) by mouth daily at 6 PM. Patient not taking: Reported on 09/13/2019 05/11/19   Edwin Dada, MD  furosemide (LASIX) 20 MG tablet Take 1 tablet (20 mg total) by mouth daily. Patient not taking: Reported on 09/13/2019 05/11/19   Edwin Dada, MD    omeprazole (PRILOSEC) 20 MG capsule Take 1 capsule (20 mg total) by mouth daily. Patient not taking: Reported on 11/27/2019 11/18/19   Blanchie Dessert, MD   Social History   Socioeconomic History  . Marital status: Single    Spouse name: Not on file  . Number of children: 0  . Years of education: 71  . Highest education level: High school graduate  Occupational  History  . Occupation: Disabled  Tobacco Use  . Smoking status: Current Every Day Smoker    Packs/day: 0.25    Types: Cigarettes  . Smokeless tobacco: Never Used  . Tobacco comment: 4 cigs per day 01/2019  Substance and Sexual Activity  . Alcohol use: No  . Drug use: Not Currently    Comment: h/o IVD use  . Sexual activity: Not Currently  Other Topics Concern  . Not on file  Social History Narrative   Lives at home alone.   Right-handed.   Caffeine use: 4 cups coffee/soda   Social Determinants of Health   Financial Resource Strain:   . Difficulty of Paying Living Expenses:   Food Insecurity:   . Worried About Charity fundraiser in the Last Year:   . Arboriculturist in the Last Year:   Transportation Needs:   . Film/video editor (Medical):   Marland Kitchen Lack of Transportation (Non-Medical):   Physical Activity:   . Days of Exercise per Week:   . Minutes of Exercise per Session:   Stress:   . Feeling of Stress :   Social Connections:   . Frequency of Communication with Friends and Family:   . Frequency of Social Gatherings with Friends and Family:   . Attends Religious Services:   . Active Member of Clubs or Organizations:   . Attends Archivist Meetings:   Marland Kitchen Marital Status:   Intimate Partner Violence:   . Fear of Current or Ex-Partner:   . Emotionally Abused:   Marland Kitchen Physically Abused:   . Sexually Abused:     Review of Systems  Per HPI.  Objective:   Vitals:   11/27/19 1549 11/27/19 1556  BP: (!) 160/73 (!) 166/66  Pulse: 88   Temp: 98.2 F (36.8 C)   TempSrc: Temporal   SpO2: 95%    Weight: 241 lb 9.6 oz (109.6 kg)   Height: 5\' 2"  (1.575 m)      Physical Exam Vitals reviewed.  Constitutional:      Appearance: She is well-developed.  HENT:     Head: Normocephalic and atraumatic.  Eyes:     Conjunctiva/sclera: Conjunctivae normal.     Pupils: Pupils are equal, round, and reactive to light.  Neck:     Vascular: No carotid bruit.  Cardiovascular:     Rate and Rhythm: Normal rate and regular rhythm.     Heart sounds: Normal heart sounds.  Pulmonary:     Effort: Pulmonary effort is normal.     Breath sounds: Normal breath sounds.  Abdominal:     Palpations: Abdomen is soft. There is no pulsatile mass.     Tenderness: There is abdominal tenderness in the epigastric area. There is no guarding or rebound.  Skin:    General: Skin is warm and dry.  Neurological:     Mental Status: She is alert and oriented to person, place, and time.  Psychiatric:        Behavior: Behavior normal.       Approximately 35 min visit with greater than 50% counseling.  Assessment & Plan:  Sherry [name] is a 64 y.o. female . Abdominal pain, epigastric - Plan: CBC, pantoprazole (PROTONIX) 20 MG tablet, Ambulatory referral to Gastroenterology CKD (chronic kidney disease) stage 4, GFR 15-29 ml/min (HCC) - Plan: CBC Gastritis, presence of bleeding unspecified, unspecified chronicity, unspecified gastritis type - Plan: H. pylori breath test, pantoprazole (PROTONIX) 20 MG tablet, Ambulatory referral to Gastroenterology  -Epigastric  abdominal pain, similar symptoms in the past.  History of gastritis, possible recurrence.    -Recheck CBC, history of chronic anemia likely with CKD.  -Change back to Protonix twice daily, check H. pylori, refer to gastroenterology  -Commended on decreased cigarette use, as well as decreased soda intake.  Handout given on trigger foods for reflux.  -Recheck 1 week, ER precautions given  Essential hypertension  -Decreased control in office, reports improved  readings at home.  Denies missed medications.  Question whether or not she is taking furosemide.  We will have her bring medicines into next visit along with home blood pressure log.  Continue follow-up with nephrology as planned as well  Meds ordered this encounter  Medications  . pantoprazole (PROTONIX) 20 MG tablet    Sig: Take 1 tablet (20 mg total) by mouth 2 (two) times daily.    Dispense:  180 tablet    Refill:  1   Patient Instructions    Keep up the good work with quitting smoking.  You may need to be taking furosemide.  Keep a record of your blood pressures outside of the office and bring them to the next office visit with your medicines in next 2 weeks. Keep follow up with kidney doctor as planned in June.   Change omeprazole back to pantoprazole. continue carafate. Good work cutting back on [employer]. Avoid sodas and see other foods below that may worsen heartburn.  I will refer you to gastroenterology, but return to the clinic or go to the nearest emergency room if any of your symptoms worsen or new symptoms occur prior to follow up next week.    Food Choices for Gastroesophageal Reflux Disease, Adult When you have gastroesophageal reflux disease (GERD), the foods you eat and your eating habits are very important. Choosing the right foods can help ease your discomfort. Think about working with a nutrition specialist (dietitian) to help you make good choices. What are tips for following this plan?  Meals  Choose healthy foods that are low in fat, such as fruits, vegetables, whole grains, low-fat dairy products, and lean meat, fish, and poultry.  Eat small meals often instead of 3 large meals a day. Eat your meals slowly, and in a place where you are relaxed. Avoid bending over or lying down until 2-3 hours after eating.  Avoid eating meals 2-3 hours before bed.  Avoid drinking a lot of liquid with meals.  Cook foods using methods other than frying. Bake, grill, or  broil food instead.  Avoid or limit: ? Chocolate. ? Peppermint or spearmint. ? Alcohol. ? Pepper. ? Black and decaffeinated coffee. ? Black and decaffeinated tea. ? Bubbly (carbonated) soft drinks. ? Caffeinated energy drinks and soft drinks.  Limit high-fat foods such as: ? Fatty meat or fried foods. ? Whole milk, cream, butter, or ice cream. ? Nuts and nut butters. ? Pastries, donuts, and sweets made with butter or shortening.  Avoid foods that cause symptoms. These foods may be different for everyone. Common foods that cause symptoms include: ? Tomatoes. ? Oranges, lemons, and limes. ? Peppers. ? Spicy food. ? Onions and garlic. ? Vinegar. Lifestyle  Maintain a healthy weight. Ask your doctor what weight is healthy for you. If you need to lose weight, work with your doctor to do so safely.  Exercise for at least 30 minutes for 5 or more days each week, or as told by your doctor.  Wear loose-fitting clothes.  Do not smoke. If you  need help quitting, ask your doctor.  Sleep with the head of your bed higher than your feet. Use a wedge under the mattress or blocks under the bed frame to raise the head of the bed. Summary  When you have gastroesophageal reflux disease (GERD), food and lifestyle choices are very important in easing your symptoms.  Eat small meals often instead of 3 large meals a day. Eat your meals slowly, and in a place where you are relaxed.  Limit high-fat foods such as fatty meat or fried foods.  Avoid bending over or lying down until 2-3 hours after eating.  Avoid peppermint and spearmint, caffeine, alcohol, and chocolate. This information is not intended to replace advice given to you by your health care provider. Make sure you discuss any questions you have with your health care provider. Document Revised: 10/20/2018 Document Reviewed: 08/04/2016 Elsevier Patient Education  [place].  Abdominal Pain, Adult Pain in the abdomen  (abdominal pain) can be caused by many things. Often, abdominal pain is not serious and it gets better with no treatment or by being treated at home. However, sometimes abdominal pain is serious. Your health care provider will ask questions about your medical history and do a physical exam to try to determine the cause of your abdominal pain. Follow these instructions at home:  Medicines  Take over-the-counter and prescription medicines only as told by your health care provider.  Do not take a laxative unless told by your health care provider. General instructions  Watch your condition for any changes.  Drink enough fluid to keep your urine pale yellow.  Keep all follow-up visits as told by your health care provider. This is important. Contact a health care provider if:  Your abdominal pain changes or gets worse.  You are not hungry or you lose weight without trying.  You are constipated or have diarrhea for more than 2-3 days.  You have pain when you urinate or have a bowel movement.  Your abdominal pain wakes you up at night.  Your pain gets worse with meals, after eating, or with certain foods.  You are vomiting and cannot keep anything down.  You have a fever.  You have blood in your urine. Get help right away if:  Your pain does not go away as soon as your health care provider told you to expect.  You cannot stop vomiting.  Your pain is only in areas of the abdomen, such as the right side or the left lower portion of the abdomen. Pain on the right side could be caused by appendicitis.  You have bloody or black stools, or stools that look like tar.  You have severe pain, cramping, or bloating in your abdomen.  You have signs of dehydration, such as: ? Dark urine, very little urine, or no urine. ? Cracked lips. ? Dry mouth. ? Sunken eyes. ? Sleepiness. ? Weakness.  You have trouble breathing or chest pain. Summary  Often, abdominal pain is not serious and  it gets better with no treatment or by being treated at home. However, sometimes abdominal pain is serious.  Watch your condition for any changes.  Take over-the-counter and prescription medicines only as told by your health care provider.  Contact a health care provider if your abdominal pain changes or gets worse.  Get help right away if you have severe pain, cramping, or bloating in your abdomen. This information is not intended to replace advice given to you by your health care  provider. Make sure you discuss any questions you have with your health care provider. Document Revised: 11/07/2018 Document Reviewed: 11/07/2018 Elsevier Patient Education  2020 [place] with Quitting Smoking  Quitting smoking is a physical and mental challenge. You will face cravings, withdrawal symptoms, and temptation. Before quitting, work with your health care provider to make a plan that can help you cope. Preparation can help you quit and keep you from giving in. How can I cope with cravings? Cravings usually last for 5-10 minutes. If you get through it, the craving will pass. Consider taking the following actions to help you cope with cravings:  Keep your mouth busy: ? Chew sugar-free gum. ? Suck on hard candies or a straw. ? Brush your teeth.  Keep your hands and body busy: ? Immediately change to a different activity when you feel a craving. ? Squeeze or play with a ball. ? Do an activity or a hobby, like making bead jewelry, practicing needlepoint, or working with wood. ? Mix up your normal routine. ? Take a short exercise break. Go for a quick walk or run up and down stairs. ? Spend time in public places where smoking is not allowed.  Focus on doing something kind or helpful for someone else.  Call a friend or family member to talk during a craving.  Join a support group.  Call a quit line, such as 1-800-QUIT-NOW.  Talk with your health care provider about medicines that  might help you cope with cravings and make quitting easier for you. How can I deal with withdrawal symptoms? Your body may experience negative effects as it tries to get used to not having nicotine in the system. These effects are called withdrawal symptoms. They may include:  Feeling hungrier than normal.  Trouble concentrating.  Irritability.  Trouble sleeping.  Feeling depressed.  Restlessness and agitation.  Craving a cigarette. To manage withdrawal symptoms:  Avoid places, people, and activities that trigger your cravings.  Remember why you want to quit.  Get plenty of sleep.  Avoid coffee and other caffeinated drinks. These may worsen some of your symptoms. How can I handle social situations? Social situations can be difficult when you are quitting smoking, especially in the first few weeks. To manage this, you can:  Avoid parties, bars, and other social situations where people might be smoking.  Avoid alcohol.  Leave right away if you have the urge to smoke.  Explain to your family and friends that you are quitting smoking. Ask for understanding and support.  Plan activities with friends or family where smoking is not an option. What are some ways I can cope with stress? Wanting to smoke may cause stress, and stress can make you want to smoke. Find ways to manage your stress. Relaxation techniques can help. For example:  Breathe slowly and deeply, in through your nose and out through your mouth.  Listen to soothing, relaxing music.  Talk with a family member or friend about your stress.  Light a candle.  Soak in a bath or take a shower.  Think about a peaceful place. What are some ways I can prevent weight gain? Be aware that many people gain weight after they quit smoking. However, not everyone does. To keep from gaining weight, have a plan in place before you quit and stick to the plan after you quit. Your plan should include:  Having healthy snacks.  When you have a craving, it may help to: ? Eat plain  popcorn, crunchy carrots, celery, or other cut vegetables. ? Chew sugar-free gum.  Changing how you eat: ? Eat small portion sizes at meals. ? Eat 4-6 small meals throughout the day instead of 1-2 large meals a day. ? Be mindful when you eat. Do not watch television or do other things that might distract you as you eat.  Exercising regularly: ? Make time to exercise each day. If you do not have time for a long workout, do short bouts of exercise for 5-10 minutes several times a day. ? Do some form of strengthening exercise, like weight lifting, and some form of aerobic exercise, like running or swimming.  Drinking plenty of water or other low-calorie or no-calorie drinks. Drink 6-8 glasses of water daily, or as much as instructed by your health care provider. Summary  Quitting smoking is a physical and mental challenge. You will face cravings, withdrawal symptoms, and temptation to smoke again. Preparation can help you as you go through these challenges.  You can cope with cravings by keeping your mouth busy (such as by chewing gum), keeping your body and hands busy, and making calls to family, friends, or a helpline for people who want to quit smoking.  You can cope with withdrawal symptoms by avoiding places where people smoke, avoiding drinks with caffeine, and getting plenty of rest.  Ask your health care provider about the different ways to prevent weight gain, avoid stress, and handle social situations. This information is not intended to replace advice given to you by your health care provider. Make sure you discuss any questions you have with your health care provider. Document Revised: 06/11/2017 Document Reviewed: 06/26/2016 Elsevier Patient Education  [employer].   If you have lab work done today you will be contacted with your lab results within the next 2 weeks.  If you have not heard from [place] then please contact us.  The fastest way to get your results is to register for My Chart.   IF you received an x-ray today, you will receive an invoice from [facility]. Please contact [facility] at [phone] with questions or concerns regarding your invoice.   IF you received labwork today, you will receive an invoice from [facility]. Please contact [facility] at [phone] with questions or concerns regarding your invoice.   Our billing staff will not be able to assist you with questions regarding bills from these companies.  You will be contacted with the lab results as soon as they are available. The fastest way to get your results is to activate your My Chart account. Instructions are located on the last page of this paperwork. If you have not heard from [place] regarding the results in 2 weeks, please contact this office.          Signed, Merri Ray, MD [facility]

## 2019-11-27 NOTE — Patient Instructions (Addendum)
Keep up the good work with quitting smoking.  You may need to be taking furosemide.  Keep a record of your blood pressures outside of the office and bring them to the next office visit with your medicines in next 2 weeks. Keep follow up with kidney doctor as planned in June.   Change omeprazole back to pantoprazole. continue carafate. Good work cutting back on [employer]. Avoid sodas and see other foods below that may worsen heartburn.  I will refer you to gastroenterology, but return to the clinic or go to the nearest emergency room if any of your symptoms worsen or new symptoms occur prior to follow up next week.    Food Choices for Gastroesophageal Reflux Disease, Adult When you have gastroesophageal reflux disease (GERD), the foods you eat and your eating habits are very important. Choosing the right foods can help ease your discomfort. Think about working with a nutrition specialist (dietitian) to help you make good choices. What are tips for following this plan?  Meals  Choose healthy foods that are low in fat, such as fruits, vegetables, whole grains, low-fat dairy products, and lean meat, fish, and poultry.  Eat small meals often instead of 3 large meals a day. Eat your meals slowly, and in a place where you are relaxed. Avoid bending over or lying down until 2-3 hours after eating.  Avoid eating meals 2-3 hours before bed.  Avoid drinking a lot of liquid with meals.  Cook foods using methods other than frying. Bake, grill, or broil food instead.  Avoid or limit: ? Chocolate. ? Peppermint or spearmint. ? Alcohol. ? Pepper. ? Black and decaffeinated coffee. ? Black and decaffeinated tea. ? Bubbly (carbonated) soft drinks. ? Caffeinated energy drinks and soft drinks.  Limit high-fat foods such as: ? Fatty meat or fried foods. ? Whole milk, cream, butter, or ice cream. ? Nuts and nut butters. ? Pastries, donuts, and sweets made with butter or shortening.  Avoid foods  that cause symptoms. These foods may be different for everyone. Common foods that cause symptoms include: ? Tomatoes. ? Oranges, lemons, and limes. ? Peppers. ? Spicy food. ? Onions and garlic. ? Vinegar. Lifestyle  Maintain a healthy weight. Ask your doctor what weight is healthy for you. If you need to lose weight, work with your doctor to do so safely.  Exercise for at least 30 minutes for 5 or more days each week, or as told by your doctor.  Wear loose-fitting clothes.  Do not smoke. If you need help quitting, ask your doctor.  Sleep with the head of your bed higher than your feet. Use a wedge under the mattress or blocks under the bed frame to raise the head of the bed. Summary  When you have gastroesophageal reflux disease (GERD), food and lifestyle choices are very important in easing your symptoms.  Eat small meals often instead of 3 large meals a day. Eat your meals slowly, and in a place where you are relaxed.  Limit high-fat foods such as fatty meat or fried foods.  Avoid bending over or lying down until 2-3 hours after eating.  Avoid peppermint and spearmint, caffeine, alcohol, and chocolate. This information is not intended to replace advice given to you by your health care provider. Make sure you discuss any questions you have with your health care provider. Document Revised: 10/20/2018 Document Reviewed: 08/04/2016 Elsevier Patient Education  [employer].  Abdominal Pain, Adult Pain in the abdomen (abdominal pain) can be  caused by many things. Often, abdominal pain is not serious and it gets better with no treatment or by being treated at home. However, sometimes abdominal pain is serious. Your health care provider will ask questions about your medical history and do a physical exam to try to determine the cause of your abdominal pain. Follow these instructions at home:  Medicines  Take over-the-counter and prescription medicines only as told by your  health care provider.  Do not take a laxative unless told by your health care provider. General instructions  Watch your condition for any changes.  Drink enough fluid to keep your urine pale yellow.  Keep all follow-up visits as told by your health care provider. This is important. Contact a health care provider if:  Your abdominal pain changes or gets worse.  You are not hungry or you lose weight without trying.  You are constipated or have diarrhea for more than 2-3 days.  You have pain when you urinate or have a bowel movement.  Your abdominal pain wakes you up at night.  Your pain gets worse with meals, after eating, or with certain foods.  You are vomiting and cannot keep anything down.  You have a fever.  You have blood in your urine. Get help right away if:  Your pain does not go away as soon as your health care provider told you to expect.  You cannot stop vomiting.  Your pain is only in areas of the abdomen, such as the right side or the left lower portion of the abdomen. Pain on the right side could be caused by appendicitis.  You have bloody or black stools, or stools that look like tar.  You have severe pain, cramping, or bloating in your abdomen.  You have signs of dehydration, such as: ? Dark urine, very little urine, or no urine. ? Cracked lips. ? Dry mouth. ? Sunken eyes. ? Sleepiness. ? Weakness.  You have trouble breathing or chest pain. Summary  Often, abdominal pain is not serious and it gets better with no treatment or by being treated at home. However, sometimes abdominal pain is serious.  Watch your condition for any changes.  Take over-the-counter and prescription medicines only as told by your health care provider.  Contact a health care provider if your abdominal pain changes or gets worse.  Get help right away if you have severe pain, cramping, or bloating in your abdomen. This information is not intended to replace advice given  to you by your health care provider. Make sure you discuss any questions you have with your health care provider. Document Revised: 11/07/2018 Document Reviewed: 11/07/2018 Elsevier Patient Education  2020 [place] with Quitting Smoking  Quitting smoking is a physical and mental challenge. You will face cravings, withdrawal symptoms, and temptation. Before quitting, work with your health care provider to make a plan that can help you cope. Preparation can help you quit and keep you from giving in. How can I cope with cravings? Cravings usually last for 5-10 minutes. If you get through it, the craving will pass. Consider taking the following actions to help you cope with cravings:  Keep your mouth busy: ? Chew sugar-free gum. ? Suck on hard candies or a straw. ? Brush your teeth.  Keep your hands and body busy: ? Immediately change to a different activity when you feel a craving. ? Squeeze or play with a ball. ? Do an activity or a hobby, like making bead jewelry,  practicing needlepoint, or working with wood. ? Mix up your normal routine. ? Take a short exercise break. Go for a quick walk or run up and down stairs. ? Spend time in public places where smoking is not allowed.  Focus on doing something kind or helpful for someone else.  Call a friend or family member to talk during a craving.  Join a support group.  Call a quit line, such as 1-800-QUIT-NOW.  Talk with your health care provider about medicines that might help you cope with cravings and make quitting easier for you. How can I deal with withdrawal symptoms? Your body may experience negative effects as it tries to get used to not having nicotine in the system. These effects are called withdrawal symptoms. They may include:  Feeling hungrier than normal.  Trouble concentrating.  Irritability.  Trouble sleeping.  Feeling depressed.  Restlessness and agitation.  Craving a cigarette. To manage  withdrawal symptoms:  Avoid places, people, and activities that trigger your cravings.  Remember why you want to quit.  Get plenty of sleep.  Avoid coffee and other caffeinated drinks. These may worsen some of your symptoms. How can I handle social situations? Social situations can be difficult when you are quitting smoking, especially in the first few weeks. To manage this, you can:  Avoid parties, bars, and other social situations where people might be smoking.  Avoid alcohol.  Leave right away if you have the urge to smoke.  Explain to your family and friends that you are quitting smoking. Ask for understanding and support.  Plan activities with friends or family where smoking is not an option. What are some ways I can cope with stress? Wanting to smoke may cause stress, and stress can make you want to smoke. Find ways to manage your stress. Relaxation techniques can help. For example:  Breathe slowly and deeply, in through your nose and out through your mouth.  Listen to soothing, relaxing music.  Talk with a family member or friend about your stress.  Light a candle.  Soak in a bath or take a shower.  Think about a peaceful place. What are some ways I can prevent weight gain? Be aware that many people gain weight after they quit smoking. However, not everyone does. To keep from gaining weight, have a plan in place before you quit and stick to the plan after you quit. Your plan should include:  Having healthy snacks. When you have a craving, it may help to: ? Eat plain popcorn, crunchy carrots, celery, or other cut vegetables. ? Chew sugar-free gum.  Changing how you eat: ? Eat small portion sizes at meals. ? Eat 4-6 small meals throughout the day instead of 1-2 large meals a day. ? Be mindful when you eat. Do not watch television or do other things that might distract you as you eat.  Exercising regularly: ? Make time to exercise each day. If you do not have time  for a long workout, do short bouts of exercise for 5-10 minutes several times a day. ? Do some form of strengthening exercise, like weight lifting, and some form of aerobic exercise, like running or swimming.  Drinking plenty of water or other low-calorie or no-calorie drinks. Drink 6-8 glasses of water daily, or as much as instructed by your health care provider. Summary  Quitting smoking is a physical and mental challenge. You will face cravings, withdrawal symptoms, and temptation to smoke again. Preparation can help you as you go  through these challenges.  You can cope with cravings by keeping your mouth busy (such as by chewing gum), keeping your body and hands busy, and making calls to family, friends, or a helpline for people who want to quit smoking.  You can cope with withdrawal symptoms by avoiding places where people smoke, avoiding drinks with caffeine, and getting plenty of rest.  Ask your health care provider about the different ways to prevent weight gain, avoid stress, and handle social situations. This information is not intended to replace advice given to you by your health care provider. Make sure you discuss any questions you have with your health care provider. Document Revised: 06/11/2017 Document Reviewed: 06/26/2016 Elsevier Patient Education  [employer].   If you have lab work done today you will be contacted with your lab results within the next 2 weeks.  If you have not heard from [place] then please contact us. The fastest way to get your results is to register for My Chart.   IF you received an x-ray today, you will receive an invoice from [facility]. Please contact [facility] at [phone] with questions or concerns regarding your invoice.   IF you received labwork today, you will receive an invoice from [place]. Please contact [facility] at [phone] with questions or concerns regarding your invoice.   Our billing staff will not be  able to assist you with questions regarding bills from these companies.  You will be contacted with the lab results as soon as they are available. The fastest way to get your results is to activate your My Chart account. Instructions are located on the last page of this paperwork. If you have not heard from [place] regarding the results in 2 weeks, please contact this office.

## 2019-11-28 ENCOUNTER — Other Ambulatory Visit: Payer: Self-pay | Admitting: Family Medicine

## 2019-11-28 DIAGNOSIS — D509 Iron deficiency anemia, unspecified: Secondary | ICD-10-CM

## 2019-11-28 LAB — CBC
Hematocrit: 33.3 % — ABNORMAL LOW (ref 34.0–46.6)
Hemoglobin: 10.5 g/dL — ABNORMAL LOW (ref 11.1–15.9)
MCH: 27.3 pg (ref 26.6–33.0)
MCHC: 31.5 g/dL (ref 31.5–35.7)
MCV: 87 fL (ref 79–97)
Platelets: 243 10*3/uL (ref 150–450)
RBC: 3.85 x10E6/uL (ref 3.77–5.28)
RDW: 14.7 % (ref 11.7–15.4)
WBC: 6.9 10*3/uL (ref 3.4–10.8)

## 2019-11-28 LAB — H. PYLORI BREATH TEST: H pylori Breath Test: NEGATIVE

## 2019-11-28 LAB — SPECIMEN STATUS REPORT

## 2019-11-28 NOTE — Telephone Encounter (Signed)
Requested medication (s) are due for refill today: Yes  Requested medication (s) are on the active medication list: Yes  Last refill:  Both prescriptions - 10/27/18  Future visit scheduled: Yes  Notes to clinic:  Prescriptions have expired.    Requested Prescriptions  Pending Prescriptions Disp Refills   FEROSUL 325 (65 Fe) MG tablet [Pharmacy Med Name: Ferrous Sulfate 325 (65 Fe)MG TABS] 30 tablet     Sig: TAKE 1 TABLET BY MOUTH DAILY WITH BREAKFAST      Endocrinology:  Minerals - Iron Supplementation Failed - 11/28/2019  2:00 PM      Failed - HGB in normal range and within 360 days    Hemoglobin  Date Value Ref Range Status  11/27/2019 10.5 (L) 11.1 - 15.9 g/dL Final          Failed - HCT in normal range and within 360 days    Hematocrit  Date Value Ref Range Status  11/27/2019 33.3 (L) 34.0 - 46.6 % Final          Failed - Fe (serum) in normal range and within 360 days    Iron  Date Value Ref Range Status  10/21/2017 34 28 - 170 ug/dL Final   Saturation Ratios  Date Value Ref Range Status  10/21/2017 7 (L) 10.4 - 31.8 % Final          Failed - Ferritin in normal range and within 360 days    Ferritin  Date Value Ref Range Status  10/21/2017 8 (L) 11 - 307 ng/mL Final    Comment:    Performed at [facility], [place] [address], [place], [place] [address]          Passed - RBC in normal range and within 360 days    RBC  Date Value Ref Range Status  11/27/2019 3.85 3.77 - 5.28 x10E6/uL Final  11/18/2019 3.83 (L) 3.87 - 5.11 MIL/uL Final          Passed - Valid encounter within last 12 months    Recent Outpatient Visits           Yesterday Abdominal pain, epigastric   Primary Care at [facility], Ranell Patrick, MD   1 month ago    Primary Care at [facility], Arlie Solomons, MD   2 months ago Essential hypertension   Primary Care at [facility], [place], MD   3 months ago Morbid obesity [facility])   Primary Care at [facility], Arlie Solomons, MD   5 months  ago    Primary Care at [facility], Arlie Solomons, MD       Future Appointments             In 1 week [name] Ranell Patrick, MD Primary Care at [place], [facility]              terazosin (HYTRIN) 2 MG capsule [Pharmacy Med Name: Terazosin HCl 2MG  CAPS] 30 capsule     Sig: TAKE 1 CAPSULE BY MOUTH EVERY NIGHT AT BEDTIME      Cardiovascular:  Alpha Blockers Failed - 11/28/2019  2:00 PM      Failed - Last BP in normal range    BP Readings from Last 1 Encounters:  11/27/19 (!) 166/66          Passed - Valid encounter within last 6 months    Recent Outpatient Visits           Yesterday Abdominal pain, epigastric  Primary Care at [facility], Ranell Patrick, MD   1 month ago    Primary Care at [facility], Arlie Solomons, MD   2 months ago Essential hypertension   Primary Care at [facility], Arlie Solomons, MD   3 months ago Morbid obesity [facility])   Primary Care at [facility], Arlie Solomons, MD   5 months ago    Primary Care at [facility], Arlie Solomons, MD       Future Appointments             In 1 week [name] Ranell Patrick, MD Primary Care at [place], [facility]

## 2019-11-29 ENCOUNTER — Telehealth: Payer: Self-pay | Admitting: Family Medicine

## 2019-11-29 NOTE — Telephone Encounter (Signed)
Pt what she what know if she stop Sucralfate,and takes what the doctor Prescribes.please Advice

## 2019-12-07 ENCOUNTER — Ambulatory Visit: Payer: Medicare HMO | Admitting: Family Medicine

## 2019-12-11 ENCOUNTER — Other Ambulatory Visit: Payer: Self-pay | Admitting: Family Medicine

## 2019-12-12 ENCOUNTER — Other Ambulatory Visit: Payer: Self-pay | Admitting: Family Medicine

## 2019-12-12 DIAGNOSIS — R6889 Other general symptoms and signs: Secondary | ICD-10-CM | POA: Diagnosis not present

## 2019-12-12 DIAGNOSIS — D631 Anemia in chronic kidney disease: Secondary | ICD-10-CM | POA: Diagnosis not present

## 2019-12-12 DIAGNOSIS — Z8619 Personal history of other infectious and parasitic diseases: Secondary | ICD-10-CM | POA: Diagnosis not present

## 2019-12-12 DIAGNOSIS — I129 Hypertensive chronic kidney disease with stage 1 through stage 4 chronic kidney disease, or unspecified chronic kidney disease: Secondary | ICD-10-CM | POA: Diagnosis not present

## 2019-12-12 DIAGNOSIS — E559 Vitamin D deficiency, unspecified: Secondary | ICD-10-CM | POA: Diagnosis not present

## 2019-12-12 DIAGNOSIS — N184 Chronic kidney disease, stage 4 (severe): Secondary | ICD-10-CM | POA: Diagnosis not present

## 2019-12-12 NOTE — Telephone Encounter (Signed)
Requested Prescriptions  Pending Prescriptions Disp Refills  . oxybutynin (DITROPAN-XL) 5 MG 24 hr tablet [Pharmacy Med Name: OXYBUTYNIN ER 5MG  TABLETS] 30 tablet 0    Sig: TAKE 1 TABLET(5 MG) BY MOUTH AT BEDTIME     Urology:  Bladder Agents Passed - 12/11/2019  3:20 AM      Passed - Valid encounter within last 12 months    Recent Outpatient Visits          2 weeks ago Abdominal pain, epigastric   Primary Care at [facility], Ranell Patrick, MD   2 months ago    Primary Care at [facility], Arlie Solomons, MD   3 months ago Essential hypertension   Primary Care at [facility], Arlie Solomons, MD   4 months ago Morbid obesity [facility])   Primary Care at [facility], Arlie Solomons, MD   5 months ago    Primary Care at [facility], Arlie Solomons, MD      Future Appointments            Tomorrow Wendie Agreste, MD Primary Care at [place], [facility]

## 2019-12-13 ENCOUNTER — Telehealth: Payer: Self-pay

## 2019-12-13 ENCOUNTER — Other Ambulatory Visit: Payer: Self-pay

## 2019-12-13 ENCOUNTER — Ambulatory Visit (INDEPENDENT_AMBULATORY_CARE_PROVIDER_SITE_OTHER): Payer: Medicare HMO | Admitting: Family Medicine

## 2019-12-13 ENCOUNTER — Encounter: Payer: Self-pay | Admitting: Family Medicine

## 2019-12-13 VITALS — BP 164/82 | HR 79 | Temp 98.3°F | Ht 62.0 in | Wt 240.0 lb

## 2019-12-13 DIAGNOSIS — R1013 Epigastric pain: Secondary | ICD-10-CM | POA: Diagnosis not present

## 2019-12-13 DIAGNOSIS — R6889 Other general symptoms and signs: Secondary | ICD-10-CM | POA: Diagnosis not present

## 2019-12-13 DIAGNOSIS — I10 Essential (primary) hypertension: Secondary | ICD-10-CM

## 2019-12-13 DIAGNOSIS — K2970 Gastritis, unspecified, without bleeding: Secondary | ICD-10-CM

## 2019-12-13 LAB — POCT CBC
Granulocyte percent: 73.5 %G (ref 37–80)
HCT, POC: 31.6 % (ref 29–41)
Hemoglobin: 10.4 g/dL — AB (ref 11–14.6)
Lymph, poc: 1.3 (ref 0.6–3.4)
MCH, POC: 27.4 pg (ref 27–31.2)
MCHC: 32.8 g/dL (ref 31.8–35.4)
MCV: 83.5 fL (ref 76–111)
MID (cbc): 0.6 (ref 0–0.9)
MPV: 6.7 fL (ref 0–99.8)
POC Granulocyte: 5.3 (ref 2–6.9)
POC LYMPH PERCENT: 18.7 %L (ref 10–50)
POC MID %: 7.8 %M (ref 0–12)
Platelet Count, POC: 311 10*3/uL (ref 142–424)
RBC: 3.79 M/uL — AB (ref 4.04–5.48)
RDW, POC: 14.8 %
WBC: 7.2 10*3/uL (ref 4.6–10.2)

## 2019-12-13 MED ORDER — HYDROCODONE-ACETAMINOPHEN 5-325 MG PO TABS: 1.0000 | ORAL_TABLET | Freq: Four times a day (QID) | ORAL | 0 refills | Status: DC | PRN

## 2019-12-13 NOTE — Patient Instructions (Addendum)
Blood counts were reassuring today.  We will try to get in touch with gastroenterology tomorrow to see if they can see you on Monday.  If pain is not improving with hydrocodone or any new/worsening symptoms, go to the emergency room.  If you have lab work done today you will be contacted with your lab results within the next 2 weeks.  If you have not heard from [place] then please contact us. The fastest way to get your results is to register for My Chart.   IF you received an x-ray today, you will receive an invoice from [facility]. Please contact [facility] at [phone] with questions or concerns regarding your invoice.   IF you received labwork today, you will receive an invoice from [place]. Please contact [facility] at [phone] with questions or concerns regarding your invoice.   Our billing staff will not be able to assist you with questions regarding bills from these companies.  You will be contacted with the lab results as soon as they are available. The fastest way to get your results is to activate your My Chart account. Instructions are located on the last page of this paperwork. If you have not heard from [place] regarding the results in 2 weeks, please contact this office.

## 2019-12-13 NOTE — Telephone Encounter (Signed)
Called [facility] to cancel hydrocodone medication. Had to leave a VM because it's after hours. Will call again in the morning to make sure it was canceled.

## 2019-12-13 NOTE — Progress Notes (Signed)
Subjective:  Patient ID: Sherry [name], female    DOB: 05/24/1956  Age: 64 y.o. MRN: [id number]  CC:  Chief Complaint  Patient presents with  . Follow-up    on abdominal pain. pt is still experiancing the abdominal pain from last visit. 8/10 pain level. pain is located in themiddle of the upper abdominal area pt states the pain is on her back in the same general area just on her back. pain is throbing.  Marland Kitchen Hypertension    pt reports no issues with BP since last visit. pt checks her BP every other day. pt states her BP runs high when shes in pain, and this would be why her BP is high today.    HPI Emanuela Runnion presents for   Hypertension: Complicated by history of CKD, followed by nephrology, Dr. Carolin Sicks.  Cardiology Dr. Geraldo Pitter. Appointment with nephrology yesterday - no changes.  There was a question of whether or not she was taking furosemide at last visit.  She was continued on Norvasc, hydralazine, labetalol, terazosin No chest pain or new dyspnea.  BP elevated with pain in past.  No missed meds.  Home BP 130-135/86.   Home readings: BP Readings from Last 3 Encounters:  12/13/19 (!) 164/82  11/27/19 (!) 166/66  11/18/19 (!) 186/85   Lab Results  Component Value Date   CREATININE 2.53 (H) 11/18/2019    Abdominal pain: Discussed at May 17 visit.  Previous ER visit on the eighth.  CT abdomen pelvis without acute findings or explanation for her symptoms at that time.  She had been taking Carafate but only omeprazole once per day at last visit.  Epigastric abdominal pain at that time, similar symptoms in the past with history of gastritis, possible recurrence.  She was changed back to Protonix twice daily.  H. pylori negative.  Referred to gastroenterology.  Normal WBC, hemoglobin 10.5 at that time.  Stable from 10.6 on May 8.  Pain same since last visit, just not going away. Still epigastric area - persistent. Worse with food. No n/v/fever.  No diarrhea, no blood in stool. No  dark tarry stools. BM this am - soft stool.  Brought meds Taking carafate QID and QHS.  Omeprazole 20mg  qd AND protonix 20mg  BID Has not heard from Dr. Bryan Lemma. - Vm left yesterday for pt.  No alcohol.  No nsaids. On gabapentin for neuropathy. No other pain meds. needss something for pain.  Trouble with overuse of percocet few years ago, not recent. Hydrocodone in ER helped.  Seizure many years ago.  Controlled substance database reviewed.  Hydrocodone 5/325 #3 on May 9, #10 on June 01, 2019, oxycodone No. 5 on 07/15/2018.  Needs 3-day notice for transportation   History Patient Active Problem List   Diagnosis Date Noted  . Chronic diastolic CHF (congestive heart failure) ([place]) 05/09/2019  . GERD (gastroesophageal reflux disease) 05/09/2019  . Alcohol abuse 05/09/2019  . Hypertension 05/08/2019  . Bilateral low back pain with bilateral sciatica 01/05/2019  . Cyst of ovary   . Pre-operative cardiovascular examination 06/13/2018  . Dizziness 02/24/2018  . Left-sided weakness 02/24/2018  . Cigarette smoker 12/31/2017  . DOE (dyspnea on exertion) 12/30/2017  . Phlebitis after infusion 10/28/2017  . Hypertensive emergency 10/21/2017  . Microcytic anemia 10/21/2017  . Medication intolerance 09/06/2017  . Neuroleptic-induced tardive dyskinesia 09/06/2017  . Cancer ([place]) 09/06/2017  . Chronic low back pain 09/06/2017  . Grief reaction with prolonged bereavement 09/06/2017  . Opioid use disorder, severe, dependence (  [place]) 08/27/2017  . Alcohol use disorder, severe, dependence ([place]) 08/27/2017  . Substance induced mood disorder ([place]) 08/13/2017  . AKI (acute kidney injury) ([place])   . MDD (major depressive disorder), recurrent severe, without psychosis ([place])   . Alcohol withdrawal ([place]) 08/09/2017  . Essential hypertension 07/06/2017  . Chronic kidney disease, stage III (moderate) 11/17/2016  . Depression with anxiety 08/21/2016  . Hx of bipolar disorder 01/31/2016  . History  of breast cancer 02/13/2013  . Morbid obesity due to excess calories ([place]) 02/13/2013  . Hepatitis C virus infection cured after antiviral drug therapy 12/17/2012   Past Medical History:  Diagnosis Date  . Anxiety   . Bipolar and related disorder ([place])   . Blood transfusion without reported diagnosis   . Breast cancer ([place])    right lumpectemy and lymph node removal  . CHF (congestive heart failure) ([place])   . Chronic back pain   . Chronic kidney disease    "Stage IV" - states due to hypertension  . Depression   . GERD (gastroesophageal reflux disease)   . Hepatitis C   . Hypertension   . Left-sided weakness   . MRSA infection    of breast incision  . Opioid dependence ([place])    Has been treated in [facility] in past  . Substance abuse ([place])   . TIA (transient ischemic attack) 2020   P/w BP >230/120 and neurologic symptoms, presumed TIA   Past Surgical History:  Procedure Laterality Date  . BACK SURGERY  1990  . BREAST SURGERY Right 2010   "breast cancer survivor" - states partial mastectomy and nodes  . COLONOSCOPY     [facility]  . ESOPHAGOGASTRODUODENOSCOPY     [facility]  . LAPAROSCOPIC CHOLECYSTECTOMY  2002  . LAPAROSCOPIC INCISIONAL / UMBILICAL / VENTRAL HERNIA REPAIR  04/13/2018   with BARD 15x 40JW mesh (supraumbilical)  . LAPAROSCOPIC LYSIS OF ADHESIONS  07/12/2018   Procedure: LAPAROSCOPIC LYSIS OF ADHESIONS;  Surgeon: Isabel Caprice, MD;  Location: [facility];  Service: Gynecology;;  . MYOMECTOMY     x 2 prior to hysterectomy  . ROBOTIC ASSISTED BILATERAL SALPINGO OOPHERECTOMY Right 07/12/2018   Procedure: XI ROBOTIC ASSISTED RIGHT SALPINGO OOPHORECTOMY;  Surgeon: Isabel Caprice, MD;  Location: [facility];  Service: Gynecology;  Laterality: Right;  . TOTAL ABDOMINAL HYSTERECTOMY     fibroids   . UPPER GASTROINTESTINAL ENDOSCOPY     Allergies  Allergen Reactions  . Abilify [Aripiprazole] Other (See Comments)    Tardive dyskinesia Oral  .  Remeron [Mirtazapine] Other (See Comments)    Wgt stimulation /gain, Dizziness, Patient says "can tolerate"  . Trazodone And Nefazodone Other (See Comments)    Nightmares/sleep diturbance  . Flexeril [Cyclobenzaprine] Other (See Comments)    Pt states Flexeril makes her feel depressed   . Amoxicillin Diarrhea and Other (See Comments)    NOTE the patient has had PCN WITHOUT reaction Has patient had a PCN reaction causing immediate rash, facial/tongue/throat swelling, SOB or lightheadedness with hypotension: No Has patient had a PCN reaction causing severe rash involving mucus membranes or skin necrosis: No Has patient had a PCN reaction that required hospitalization: No Has patient had a PCN reaction occurring within the last 10 years: No If all of the above answers are "NO", then may proceed with Cephalosporin use.    Prior to Admission medications   Medication Sig Start Date End Date Taking? Authorizing Provider  amLODipine (NORVASC) 10 MG tablet Take  1 tablet (10 mg total) by mouth daily. 09/13/19  Yes Forrest Moron, MD  Cholecalciferol (VITAMIN D3) 50 MCG (2000 UT) capsule Take 2,000 Units by mouth daily.   Yes [provider]  FEROSUL 325 (65 Fe) MG tablet TAKE 1 TABLET BY MOUTH DAILY WITH BREAKFAST 11/29/19  Yes Wendie Agreste, MD  FLUoxetine (PROZAC) 40 MG capsule Take 40 mg by mouth daily.  11/09/18  Yes [provider]  gabapentin (NEURONTIN) 800 MG tablet Take 1 tablet (800 mg total) by mouth 3 (three) times daily. 09/13/19  Yes Delia Chimes A, MD  hydrALAZINE (APRESOLINE) 25 MG tablet Take 1 tablet (25 mg total) by mouth 3 (three) times daily. 05/11/19  Yes Danford, Suann Larry, MD  INGREZZA 80 MG CAPS Take 80 mg by mouth daily. 10/15/17  Yes [provider]  isosorbide mononitrate (IMDUR) 60 MG 24 hr tablet TAKE 1 TABLET (60 MG TOTAL) BY MOUTH DAILY. 09/25/19  Yes Stallings, Zoe A, MD  labetalol (NORMODYNE) 200 MG tablet Take 1 tablet (200 mg total) by  mouth 2 (two) times daily. 05/11/19  Yes Danford, Suann Larry, MD  omeprazole (PRILOSEC) 20 MG capsule Take 20 mg by mouth daily.   Yes [provider]  ondansetron (ZOFRAN) 4 MG tablet Take 1 tablet (4 mg total) by mouth every 8 (eight) hours as needed for nausea or vomiting. 05/11/19  Yes Stallings, Zoe A, MD  pantoprazole (PROTONIX) 20 MG tablet Take 1 tablet (20 mg total) by mouth 2 (two) times daily. 11/27/19  Yes Wendie Agreste, MD  sucralfate (CARAFATE) 1 g tablet Take 1 tablet (1 g total) by mouth 4 (four) times daily -  with meals and at bedtime. 11/18/19  Yes Plunkett, Loree Fee, MD  terazosin (HYTRIN) 2 MG capsule TAKE 1 CAPSULE BY MOUTH EVERY NIGHT AT BEDTIME 11/29/19  Yes Wendie Agreste, MD  aspirin 81 MG chewable tablet Chew 1 tablet (81 mg total) by mouth daily. Patient not taking: Reported on 09/13/2019 05/11/19   Edwin Dada, MD  atorvastatin (LIPITOR) 40 MG tablet Take 1 tablet (40 mg total) by mouth daily at 6 PM. Patient not taking: Reported on 09/13/2019 05/11/19   Edwin Dada, MD  furosemide (LASIX) 20 MG tablet Take 1 tablet (20 mg total) by mouth daily. Patient not taking: Reported on 09/13/2019 05/11/19   Edwin Dada, MD  hydrOXYzine (VISTARIL) 25 MG capsule Take 25 mg by mouth 4 (four) times daily as needed for anxiety. 12/26/18   [provider]  Multiple Vitamins-Minerals (ADULT GUMMY) CHEW Chew 1 tablet by mouth daily. 07/21/17   [provider]  oxybutynin (DITROPAN-XL) 5 MG 24 hr tablet TAKE 1 TABLET(5 MG) BY MOUTH AT BEDTIME Patient not taking: Reported on 12/13/2019 12/12/19   Wendie Agreste, MD  varenicline (CHANTIX CONTINUING MONTH PAK) 1 MG tablet Take 1 tablet (1 mg total) by mouth 2 (two) times daily. Patient not taking: Reported on 12/13/2019 09/13/19   Forrest Moron, MD   Social History   Socioeconomic History  . Marital status: Single    Spouse name: Not on file  . Number of children: 0  . Years of  education: 83  . Highest education level: High school graduate  Occupational History  . Occupation: Disabled  Tobacco Use  . Smoking status: Current Every Day Smoker    Packs/day: 0.25    Types: Cigarettes  . Smokeless tobacco: Never Used  . Tobacco comment: 4 cigs per day 01/2019  Substance and Sexual Activity  . Alcohol use: No  . Drug use: Not Currently    Comment: h/o IVD use  . Sexual activity: Not Currently  Other Topics Concern  . Not on file  Social History Narrative   Lives at home alone.   Right-handed.   Caffeine use: 4 cups coffee/soda   Social Determinants of Health   Financial Resource Strain:   . Difficulty of Paying Living Expenses:   Food Insecurity:   . Worried About Charity fundraiser in the Last Year:   . Arboriculturist in the Last Year:   Transportation Needs:   . Film/video editor (Medical):   Marland Kitchen Lack of Transportation (Non-Medical):   Physical Activity:   . Days of Exercise per Week:   . Minutes of Exercise per Session:   Stress:   . Feeling of Stress :   Social Connections:   . Frequency of Communication with Friends and Family:   . Frequency of Social Gatherings with Friends and Family:   . Attends Religious Services:   . Active Member of Clubs or Organizations:   . Attends Archivist Meetings:   Marland Kitchen Marital Status:   Intimate Partner Violence:   . Fear of Current or Ex-Partner:   . Emotionally Abused:   Marland Kitchen Physically Abused:   . Sexually Abused:     Review of Systems Per HPI.   Objective:   Vitals:   12/13/19 1612 12/13/19 1625  BP: (!) 198/84 (!) 164/82  Pulse: 79   Temp: 98.3 F (36.8 C)   TempSrc: Temporal   SpO2: 100%   Weight: 240 lb (108.9 kg)   Height: 5\' 2"  (1.575 m)      Physical Exam Vitals reviewed.  Constitutional:      Appearance: She is well-developed.  HENT:     Head: Normocephalic and atraumatic.  Eyes:     Conjunctiva/sclera: Conjunctivae normal.     Pupils: Pupils are equal, round, and  reactive to light.  Neck:     Vascular: No carotid bruit.  Cardiovascular:     Rate and Rhythm: Normal rate and regular rhythm.     Heart sounds: Normal heart sounds.  Pulmonary:     Effort: Pulmonary effort is normal.     Breath sounds: Normal breath sounds.  Abdominal:     General: Bowel sounds are normal. There is no distension.     Palpations: Abdomen is soft. There is no pulsatile mass.     Tenderness: There is abdominal tenderness (Same area as previously with epigastric discomfort.  Minimal left abdomen, but primarily epigastric.  No rebound/guarding/CVA tenderness.). There is no guarding or rebound.  Skin:    General: Skin is warm and dry.  Neurological:     Mental Status: She is alert and oriented to person, place, and time.  Psychiatric:        Behavior: Behavior normal.     Results for orders placed or performed in visit on 12/13/19  POCT CBC  Result Value Ref Range   WBC 7.2 4.6 - 10.2 K/uL   Lymph, poc 1.3 0.6 - 3.4   POC LYMPH PERCENT 18.7 10 - 50 %L   MID (cbc) 0.6 0 - 0.9   POC MID % 7.8 0 - 12 %M   POC Granulocyte 5.3 2 - 6.9   Granulocyte percent 73.5 37 - 80 %G   RBC 3.79 (A) 4.04 - 5.48 M/uL   Hemoglobin 10.4 (A) 11 - 14.6  g/dL   HCT, POC 31.6 29 - 41 %   MCV 83.5 76 - 111 fL   MCH, POC 27.4 27 - 31.2 pg   MCHC 32.8 31.8 - 35.4 g/dL   RDW, POC 14.8 %   Platelet Count, POC 311 142 - 424 K/uL   MPV 6.7 0 - 99.8 fL     Assessment & Plan:  Lavanda Nevels is a 64 y.o. female . Abdominal pain, epigastric - Plan: POCT CBC, HYDROcodone-acetaminophen (NORCO/VICODIN) 5-325 MG tablet, DISCONTINUED: HYDROcodone-acetaminophen (NORCO/VICODIN) 5-325 MG tablet Gastritis, presence of bleeding unspecified, unspecified chronicity, unspecified gastritis type - Plan: POCT CBC, HYDROcodone-acetaminophen (NORCO/VICODIN) 5-325 MG tablet, DISCONTINUED: HYDROcodone-acetaminophen (NORCO/VICODIN) 5-325 MG tablet  -Persistent epigastric pain past approximately 3-4 weeks.   Afebrile, repeat CBC is reassuring, less likely infectious cause.  Previous CT scan without concerning findings.  Reports the same pain.  On med review does appear she has been taking carafate and PPI (possibly taking 2 different versions of PPI).  Still suspect recurrence of gastritis versus PUD.  Denies dark stools, hemoglobin stable  -Switch to twice daily Protonix only.  Continue Carafate  -GI eval pending.  We will try to contact their office and see if she can possibly get in sooner but for now I did provide short-term hydrocodone for pain relief.  Potential risks were discussed including with her previous overuse.  Close follow-up discussed.  ER precautions given if any worsening symptoms.  Essential hypertension  -Suspect pain component but also reports lower readings at home.  No med changes for now.  Pain treatment as above with ER precautions.  Meds ordered this encounter  Medications  . DISCONTD: HYDROcodone-acetaminophen (NORCO/VICODIN) 5-325 MG tablet    Sig: Take 1 tablet by mouth every 6 (six) hours as needed for moderate pain.    Dispense:  20 tablet    Refill:  0  . HYDROcodone-acetaminophen (NORCO/VICODIN) 5-325 MG tablet    Sig: Take 1 tablet by mouth every 6 (six) hours as needed for moderate pain.    Dispense:  20 tablet    Refill:  0   Patient Instructions   Blood counts were reassuring today.  We will try to get in touch with gastroenterology tomorrow to see if they can see you on Monday.  If pain is not improving with hydrocodone or any new/worsening symptoms, go to the emergency room.  If you have lab work done today you will be contacted with your lab results within the next 2 weeks.  If you have not heard from [place] then please contact us. The fastest way to get your results is to register for My Chart.   IF you received an x-ray today, you will receive an invoice from [facility]. Please contact [facility] at [phone] with questions or  concerns regarding your invoice.   IF you received labwork today, you will receive an invoice from [facility]. Please contact [facility] at [phone] with questions or concerns regarding your invoice.   Our billing staff will not be able to assist you with questions regarding bills from these companies.  You will be contacted with the lab results as soon as they are available. The fastest way to get your results is to activate your My Chart account. Instructions are located on the last page of this paperwork. If you have not heard from [place] regarding the results in 2 weeks, please contact this office.         Signed, Merri Ray, MD [facility]

## 2019-12-15 NOTE — Telephone Encounter (Signed)
Called pt with no answer, so I left a VM informing the pt that we would like a call back. I also informed the pt we were just wanting to check in on her since her last visit and see how she was feeling.

## 2019-12-16 ENCOUNTER — Encounter: Payer: Self-pay | Admitting: Family Medicine

## 2019-12-20 ENCOUNTER — Other Ambulatory Visit: Payer: Self-pay

## 2019-12-20 NOTE — Telephone Encounter (Signed)
Called and spoke with pt about any changes to her abdominal pain. Pt reported no change to her condition since last OV. Will speak with Dr. [name] about this call.

## 2019-12-23 NOTE — Telephone Encounter (Signed)
Noted last message from June 9.  I tried calling patient today, no answer.  Left a message we can check in with her again Monday or Tuesday.  Can decide on potential repeat imaging at that time if still no change, but glad to hear symptoms are not worse.  Does have GI follow-up but does not have for a few weeks. If any worsening abdominal pain did advise her to be seen in the ER.

## 2019-12-25 ENCOUNTER — Emergency Department (HOSPITAL_BASED_OUTPATIENT_CLINIC_OR_DEPARTMENT_OTHER): Payer: Medicare HMO

## 2019-12-25 ENCOUNTER — Emergency Department (HOSPITAL_BASED_OUTPATIENT_CLINIC_OR_DEPARTMENT_OTHER)
Admission: EM | Admit: 2019-12-25 | Discharge: 2019-12-25 | Disposition: A | Discharge status: Home / Self Care | Payer: Medicare HMO | Attending: Emergency Medicine | Admitting: Emergency Medicine

## 2019-12-25 ENCOUNTER — Other Ambulatory Visit: Payer: Self-pay

## 2019-12-25 ENCOUNTER — Encounter (HOSPITAL_BASED_OUTPATIENT_CLINIC_OR_DEPARTMENT_OTHER): Payer: Self-pay

## 2019-12-25 DIAGNOSIS — Z7982 Long term (current) use of aspirin: Secondary | ICD-10-CM | POA: Insufficient documentation

## 2019-12-25 DIAGNOSIS — I130 Hypertensive heart and chronic kidney disease with heart failure and stage 1 through stage 4 chronic kidney disease, or unspecified chronic kidney disease: Secondary | ICD-10-CM | POA: Insufficient documentation

## 2019-12-25 DIAGNOSIS — Z853 Personal history of malignant neoplasm of breast: Secondary | ICD-10-CM | POA: Diagnosis not present

## 2019-12-25 DIAGNOSIS — R1013 Epigastric pain: Secondary | ICD-10-CM | POA: Diagnosis not present

## 2019-12-25 DIAGNOSIS — N183 Chronic kidney disease, stage 3 unspecified: Secondary | ICD-10-CM | POA: Insufficient documentation

## 2019-12-25 DIAGNOSIS — F17210 Nicotine dependence, cigarettes, uncomplicated: Secondary | ICD-10-CM | POA: Insufficient documentation

## 2019-12-25 DIAGNOSIS — R1010 Upper abdominal pain, unspecified: Secondary | ICD-10-CM | POA: Diagnosis present

## 2019-12-25 DIAGNOSIS — Z8673 Personal history of transient ischemic attack (TIA), and cerebral infarction without residual deficits: Secondary | ICD-10-CM | POA: Insufficient documentation

## 2019-12-25 DIAGNOSIS — I509 Heart failure, unspecified: Secondary | ICD-10-CM | POA: Insufficient documentation

## 2019-12-25 DIAGNOSIS — N281 Cyst of kidney, acquired: Secondary | ICD-10-CM | POA: Diagnosis not present

## 2019-12-25 DIAGNOSIS — D3501 Benign neoplasm of right adrenal gland: Secondary | ICD-10-CM | POA: Diagnosis not present

## 2019-12-25 DIAGNOSIS — R109 Unspecified abdominal pain: Secondary | ICD-10-CM

## 2019-12-25 DIAGNOSIS — D3502 Benign neoplasm of left adrenal gland: Secondary | ICD-10-CM | POA: Diagnosis not present

## 2019-12-25 LAB — COMPREHENSIVE METABOLIC PANEL
ALT: 6 U/L (ref 0–44)
AST: 10 U/L — ABNORMAL LOW (ref 15–41)
Albumin: 4.1 g/dL (ref 3.5–5.0)
Alkaline Phosphatase: 114 U/L (ref 38–126)
Anion gap: 9 (ref 5–15)
BUN: 25 mg/dL — ABNORMAL HIGH (ref 8–23)
CO2: 24 mmol/L (ref 22–32)
Calcium: 9.6 mg/dL (ref 8.9–10.3)
Chloride: 107 mmol/L (ref 98–111)
Creatinine, Ser: 2.56 mg/dL — ABNORMAL HIGH (ref 0.44–1.00)
GFR calc Af Amer: 22 mL/min — ABNORMAL LOW (ref >60)
GFR calc non Af Amer: 19 mL/min — ABNORMAL LOW (ref >60)
Glucose, Bld: 112 mg/dL — ABNORMAL HIGH (ref 70–99)
Potassium: 4.6 mmol/L (ref 3.5–5.1)
Sodium: 140 mmol/L (ref 135–145)
Total Bilirubin: 0.2 mg/dL — ABNORMAL LOW (ref 0.3–1.2)
Total Protein: 7.3 g/dL (ref 6.5–8.1)

## 2019-12-25 LAB — CBC WITH DIFFERENTIAL/PLATELET
Abs Immature Granulocytes: 0.01 10*3/uL (ref 0.00–0.07)
Basophils Absolute: 0.1 10*3/uL (ref 0.0–0.1)
Basophils Relative: 1 %
Eosinophils Absolute: 0.2 10*3/uL (ref 0.0–0.5)
Eosinophils Relative: 2 %
HCT: 33.8 % — ABNORMAL LOW (ref 36.0–46.0)
Hemoglobin: 10.5 g/dL — ABNORMAL LOW (ref 12.0–15.0)
Immature Granulocytes: 0 %
Lymphocytes Relative: 25 %
Lymphs Abs: 1.6 10*3/uL (ref 0.7–4.0)
MCH: 27.2 pg (ref 26.0–34.0)
MCHC: 31.1 g/dL (ref 30.0–36.0)
MCV: 87.6 fL (ref 80.0–100.0)
Monocytes Absolute: 0.4 10*3/uL (ref 0.1–1.0)
Monocytes Relative: 7 %
Neutro Abs: 4.1 10*3/uL (ref 1.7–7.7)
Neutrophils Relative %: 65 %
Platelets: 255 10*3/uL (ref 150–400)
RBC: 3.86 MIL/uL — ABNORMAL LOW (ref 3.87–5.11)
RDW: 14.8 % (ref 11.5–15.5)
WBC: 6.3 10*3/uL (ref 4.0–10.5)
nRBC: 0 % (ref 0.0–0.2)

## 2019-12-25 LAB — URINALYSIS, ROUTINE W REFLEX MICROSCOPIC
Bilirubin Urine: NEGATIVE
Glucose, UA: NEGATIVE mg/dL
Hgb urine dipstick: NEGATIVE
Ketones, ur: NEGATIVE mg/dL
Leukocytes,Ua: NEGATIVE
Nitrite: NEGATIVE
Protein, ur: >300 mg/dL — AB
Specific Gravity, Urine: 1.025 (ref 1.005–1.030)
pH: 6 (ref 5.0–8.0)

## 2019-12-25 LAB — URINALYSIS, MICROSCOPIC (REFLEX)

## 2019-12-25 LAB — LIPASE, BLOOD: Lipase: 41 U/L (ref 11–51)

## 2019-12-25 MED ORDER — DICYCLOMINE HCL 20 MG PO TABS
20.0000 mg | ORAL_TABLET | Freq: Three times a day (TID) | ORAL | 0 refills | Status: DC | PRN
Start: 2019-12-25 — End: 2020-02-26

## 2019-12-25 MED ORDER — HALOPERIDOL LACTATE 5 MG/ML IJ SOLN
2.5000 mg | Freq: Once | INTRAMUSCULAR | Status: AC
  Administered 2019-12-25: 2.5 mg via INTRAVENOUS
  Filled 2019-12-25: qty 1

## 2019-12-25 MED ORDER — HYDROMORPHONE HCL 1 MG/ML IJ SOLN
1.0000 mg | Freq: Once | INTRAMUSCULAR | Status: AC
  Administered 2019-12-25: 1 mg via INTRAVENOUS
  Filled 2019-12-25: qty 1

## 2019-12-25 MED ORDER — DICYCLOMINE HCL 10 MG/ML IM SOLN
20.0000 mg | Freq: Once | INTRAMUSCULAR | Status: AC
  Administered 2019-12-25: 20 mg via INTRAMUSCULAR
  Filled 2019-12-25: qty 2

## 2019-12-25 MED ORDER — FAMOTIDINE IN NACL 20-0.9 MG/50ML-% IV SOLN
20.0000 mg | Freq: Once | INTRAVENOUS | Status: AC
  Administered 2019-12-25: 20 mg via INTRAVENOUS
  Filled 2019-12-25: qty 50

## 2019-12-25 MED ORDER — ONDANSETRON HCL 4 MG/2ML IJ SOLN
4.0000 mg | Freq: Once | INTRAMUSCULAR | Status: AC
  Administered 2019-12-25: 4 mg via INTRAVENOUS
  Filled 2019-12-25: qty 2

## 2019-12-25 NOTE — ED Triage Notes (Signed)
Pt arrives with c/o abdominal pain X3 days reports history of reflux with nausea.

## 2019-12-25 NOTE — ED Provider Notes (Signed)
  Physical Exam  BP (!) 180/107 (BP Location: Left Wrist)   Pulse 71   Temp 98.6 F (37 C) (Oral)   Resp 20   Ht 5\' 2"  (1.575 m)   Wt 111.1 kg   SpO2 97%   BMI 44.81 kg/m   Physical Exam  ED Course/Procedures     Procedures  MDM  Patient with abdominal pain.  Work-up reassuring.  CT scan reassuring.  No relief with Dilaudid or Haldol.  Will try some Bentyl.  Follow-up with GI as planned.  Bentyl as an outpatient.      Davonna Belling, MD 12/25/19 1615

## 2019-12-25 NOTE — Discharge Instructions (Addendum)
Follow-up with a gastroenterologist as planned.  Take the new medicine to see if it will improve the symptoms.

## 2019-12-25 NOTE — ED Provider Notes (Signed)
[facility] EMERGENCY DEPARTMENT Provider Note   CSN: [id number] Arrival date & time: 12/25/19  1211     History Chief Complaint  Patient presents with  . Abdominal Pain    Sherry [name] is a 64 y.o. female.  HPI   64 year old female with abdominal pain.  Pain is in the upper abdomen, and does not particularly lateralize.  Onset several days ago.  Constant.  Associated with nausea.  No vomiting.  No diarrhea.  No urinary complaints.  She states that she has had this pain previously and is scheduled for GI appointment in a few weeks.  Past Medical History:  Diagnosis Date  . Anxiety   . Bipolar and related disorder ([place])   . Blood transfusion without reported diagnosis   . Breast cancer ([place])    right lumpectemy and lymph node removal  . CHF (congestive heart failure) ([place])   . Chronic back pain   . Chronic kidney disease    "Stage IV" - states due to hypertension  . Depression   . GERD (gastroesophageal reflux disease)   . Hepatitis C   . Hypertension   . Left-sided weakness   . MRSA infection    of breast incision  . Opioid dependence ([place])    Has been treated in [facility] in past  . Substance abuse ([place])   . TIA (transient ischemic attack) 2020   P/w BP >230/120 and neurologic symptoms, presumed TIA    Patient Active Problem List   Diagnosis Date Noted  . Chronic diastolic CHF (congestive heart failure) ([place]) 05/09/2019  . GERD (gastroesophageal reflux disease) 05/09/2019  . Alcohol abuse 05/09/2019  . Hypertension 05/08/2019  . Bilateral low back pain with bilateral sciatica 01/05/2019  . Cyst of ovary   . Pre-operative cardiovascular examination 06/13/2018  . Dizziness 02/24/2018  . Left-sided weakness 02/24/2018  . Cigarette smoker 12/31/2017  . DOE (dyspnea on exertion) 12/30/2017  . Phlebitis after infusion 10/28/2017  . Hypertensive emergency 10/21/2017  . Microcytic anemia 10/21/2017  . Medication intolerance 09/06/2017  .  Neuroleptic-induced tardive dyskinesia 09/06/2017  . Cancer ([place]) 09/06/2017  . Chronic low back pain 09/06/2017  . Grief reaction with prolonged bereavement 09/06/2017  . Opioid use disorder, severe, dependence ([place]) 08/27/2017  . Alcohol use disorder, severe, dependence ([place]) 08/27/2017  . Substance induced mood disorder ([place]) 08/13/2017  . AKI (acute kidney injury) ([place])   . MDD (major depressive disorder), recurrent severe, without psychosis ([place])   . Alcohol withdrawal ([place]) 08/09/2017  . Essential hypertension 07/06/2017  . Chronic kidney disease, stage III (moderate) 11/17/2016  . Depression with anxiety 08/21/2016  . Hx of bipolar disorder 01/31/2016  . History of breast cancer 02/13/2013  . Morbid obesity due to excess calories ([place]) 02/13/2013  . Hepatitis C virus infection cured after antiviral drug therapy 12/17/2012    Past Surgical History:  Procedure Laterality Date  . BACK SURGERY  1990  . BREAST SURGERY Right 2010   "breast cancer survivor" - states partial mastectomy and nodes  . COLONOSCOPY     [facility]  . ESOPHAGOGASTRODUODENOSCOPY     [facility]  . LAPAROSCOPIC CHOLECYSTECTOMY  2002  . LAPAROSCOPIC INCISIONAL / UMBILICAL / VENTRAL HERNIA REPAIR  04/13/2018   with BARD 15x 37TG mesh (supraumbilical)  . LAPAROSCOPIC LYSIS OF ADHESIONS  07/12/2018   Procedure: LAPAROSCOPIC LYSIS OF ADHESIONS;  Surgeon: Isabel Caprice, MD;  Location: [facility];  Service: Gynecology;;  . MYOMECTOMY  x 2 prior to hysterectomy  . ROBOTIC ASSISTED BILATERAL SALPINGO OOPHERECTOMY Right 07/12/2018   Procedure: XI ROBOTIC ASSISTED RIGHT SALPINGO OOPHORECTOMY;  Surgeon: Isabel Caprice, MD;  Location: [facility];  Service: Gynecology;  Laterality: Right;  . TOTAL ABDOMINAL HYSTERECTOMY     fibroids   . UPPER GASTROINTESTINAL ENDOSCOPY       OB History    Gravida  2   Para  0   Term      Preterm      AB  0   Living        SAB      TAB      Ectopic       Multiple      Live Births              Family History  Problem Relation Age of Onset  . Heart attack Mother   . Breast cancer Mother 81  . Dementia Mother   . Cancer Father 9       died of bleeding from kidneys  . Colon cancer Neg Hx   . Esophageal cancer Neg Hx   . Stomach cancer Neg Hx   . Rectal cancer Neg Hx     Social History   Tobacco Use  . Smoking status: Current Every Day Smoker    Packs/day: 0.25    Types: Cigarettes  . Smokeless tobacco: Never Used  . Tobacco comment: 4 cigs per day 01/2019  Vaping Use  . Vaping Use: Former  . Start date: 06/27/2013  . Quit date: 06/14/2017  . Devices: Apple Cinnamon-0 mg  Substance Use Topics  . Alcohol use: No  . Drug use: Not Currently    Comment: h/o IVD use    Home Medications Prior to Admission medications   Medication Sig Start Date End Date Taking? Authorizing Provider  amLODipine (NORVASC) 10 MG tablet Take 1 tablet (10 mg total) by mouth daily. 09/13/19   Forrest Moron, MD  aspirin 81 MG chewable tablet Chew 1 tablet (81 mg total) by mouth daily. Patient not taking: Reported on 09/13/2019 05/11/19   Edwin Dada, MD  atorvastatin (LIPITOR) 40 MG tablet Take 1 tablet (40 mg total) by mouth daily at 6 PM. Patient not taking: Reported on 09/13/2019 05/11/19   Edwin Dada, MD  Cholecalciferol (VITAMIN D3) 50 MCG (2000 UT) capsule Take 2,000 Units by mouth daily.    [provider]  FEROSUL 325 (65 Fe) MG tablet TAKE 1 TABLET BY MOUTH DAILY WITH BREAKFAST 11/29/19   Wendie Agreste, MD  FLUoxetine (PROZAC) 40 MG capsule Take 40 mg by mouth daily.  11/09/18   [provider]  gabapentin (NEURONTIN) 800 MG tablet Take 1 tablet (800 mg total) by mouth 3 (three) times daily. 09/13/19   Forrest Moron, MD  hydrALAZINE (APRESOLINE) 25 MG tablet Take 1 tablet (25 mg total) by mouth 3 (three) times daily. 05/11/19   Danford, Suann Larry, MD  HYDROcodone-acetaminophen  (NORCO/VICODIN) 5-325 MG tablet Take 1 tablet by mouth every 6 (six) hours as needed for moderate pain. 12/13/19   Wendie Agreste, MD  hydrOXYzine (VISTARIL) 25 MG capsule Take 25 mg by mouth 4 (four) times daily as needed for anxiety. 12/26/18   [provider]  INGREZZA 80 MG CAPS Take 80 mg by mouth daily. 10/15/17   [provider]  isosorbide mononitrate (IMDUR) 60 MG 24 hr tablet TAKE 1 TABLET (60 MG TOTAL) BY MOUTH DAILY. 09/25/19  Delia Chimes A, MD  labetalol (NORMODYNE) 200 MG tablet Take 1 tablet (200 mg total) by mouth 2 (two) times daily. 05/11/19   Danford, Suann Larry, MD  Multiple Vitamins-Minerals (ADULT GUMMY) CHEW Chew 1 tablet by mouth daily. 07/21/17   [provider]  omeprazole (PRILOSEC) 20 MG capsule Take 20 mg by mouth daily.    [provider]  ondansetron (ZOFRAN) 4 MG tablet Take 1 tablet (4 mg total) by mouth every 8 (eight) hours as needed for nausea or vomiting. 05/11/19   Forrest Moron, MD  pantoprazole (PROTONIX) 20 MG tablet Take 1 tablet (20 mg total) by mouth 2 (two) times daily. 11/27/19   Wendie Agreste, MD  sucralfate (CARAFATE) 1 g tablet Take 1 tablet (1 g total) by mouth 4 (four) times daily -  with meals and at bedtime. 11/18/19   Blanchie Dessert, MD  terazosin (HYTRIN) 2 MG capsule TAKE 1 CAPSULE BY MOUTH EVERY NIGHT AT BEDTIME 11/29/19   Wendie Agreste, MD    Allergies    Abilify [aripiprazole], Remeron [mirtazapine], Trazodone and nefazodone, Flexeril [cyclobenzaprine], and Amoxicillin  Review of Systems   Review of Systems All systems reviewed and negative, other than as noted in HPI.  Physical Exam Updated Vital Signs BP (!) 175/89 (BP Location: Left Wrist)   Pulse 73   Temp 98.6 F (37 C) (Oral)   Resp 18   Ht 5\' 2"  (1.575 m)   Wt 111.1 kg   SpO2 100%   BMI 44.81 kg/m   Physical Exam Vitals and nursing note reviewed.  Constitutional:      General: She is not in acute distress.     Appearance: She is well-developed. She is obese.  HENT:     Head: Normocephalic and atraumatic.  Eyes:     General:        Right eye: No discharge.        Left eye: No discharge.     Conjunctiva/sclera: Conjunctivae normal.  Cardiovascular:     Rate and Rhythm: Normal rate and regular rhythm.     Heart sounds: Normal heart sounds. No murmur heard.  No friction rub. No gallop.   Pulmonary:     Effort: Pulmonary effort is normal. No respiratory distress.     Breath sounds: Normal breath sounds.  Abdominal:     General: There is no distension.     Palpations: Abdomen is soft.     Tenderness: There is abdominal tenderness.     Comments: tender in the epigastrium without rebound or guarding.  Musculoskeletal:        General: No tenderness.     Cervical back: Neck supple.  Skin:    General: Skin is warm and dry.  Neurological:     Mental Status: She is alert.  Psychiatric:        Behavior: Behavior normal.        Thought Content: Thought content normal.     ED Results / Procedures / Treatments   Labs (all labs ordered are listed, but only abnormal results are displayed) Labs Reviewed  CBC WITH DIFFERENTIAL/PLATELET - Abnormal; Notable for the following components:      Result Value   RBC 3.86 (*)    Hemoglobin 10.5 (*)    HCT 33.8 (*)    All other components within normal limits  COMPREHENSIVE METABOLIC PANEL - Abnormal; Notable for the following components:   Glucose, Bld 112 (*)    BUN 25 (*)  Creatinine, Ser 2.56 (*)    AST 10 (*)    Total Bilirubin 0.2 (*)    GFR calc non Af Amer 19 (*)    GFR calc Af Amer 22 (*)    All other components within normal limits  URINALYSIS, ROUTINE W REFLEX MICROSCOPIC - Abnormal; Notable for the following components:   Protein, ur >300 (*)    All other components within normal limits  URINALYSIS, MICROSCOPIC (REFLEX) - Abnormal; Notable for the following components:   Bacteria, UA FEW (*)    All other components within normal  limits  LIPASE, BLOOD    EKG None  Radiology No results found.  Procedures Procedures (including critical care time)  Medications Ordered in ED Medications  famotidine (PEPCID) IVPB 20 mg premix (has no administration in time range)  HYDROmorphone (DILAUDID) injection 1 mg (has no administration in time range)  ondansetron (ZOFRAN) injection 4 mg (has no administration in time range)    ED Course  I have reviewed the triage vital signs and the nursing notes.  Pertinent labs & imaging results that were available during my care of the patient were reviewed by me and considered in my medical decision making (see chart for details).    MDM Rules/Calculators/A&P                          64 year old female with upper abdominal pain.  Upon review of her history, this appears to be an ongoing/recurrent issue.  She was seen in the emergency room last month and has had a couple outpatient visits for similar complaints.  Labs fairly unremarkable.  CKD.  Stable today.  Treated symptomatically with improvement of symptoms. Care signed out to Dr Alvino Chapel pending CT a/p.   Final Clinical Impression(s) / ED Diagnoses Final diagnoses:  Abdominal pain, unspecified abdominal location    Rx / DC Orders ED Discharge Orders    None       Virgel Manifold, MD 12/29/19 1227

## 2019-12-25 NOTE — ED Notes (Signed)
Dr Alvino Chapel into see pt and  Go over results of tests pt reports no relief from haldol

## 2020-01-04 ENCOUNTER — Encounter: Payer: Self-pay | Admitting: Gastroenterology

## 2020-01-04 ENCOUNTER — Telehealth: Payer: Self-pay | Admitting: Family Medicine

## 2020-01-04 MED ORDER — GABAPENTIN 600 MG PO TABS: 600.0000 mg | ORAL_TABLET | Freq: Three times a day (TID) | ORAL | 0 refills | Status: DC

## 2020-01-04 NOTE — Telephone Encounter (Signed)
It appears this was most recently refilled in March by Dr. Nolon Rod. I do not mind changing that to 600 mg for now, but will need to discuss that medication further.  Please schedule visit in the next 1 month if possible.  Thanks.

## 2020-01-04 NOTE — Telephone Encounter (Signed)
Pt has transportation today  And will have up until 12:00. Would provider be able to expedite request to have Gabapentin request asap

## 2020-01-04 NOTE — Telephone Encounter (Signed)
Please Advise

## 2020-01-04 NOTE — Telephone Encounter (Signed)
Pt called stating that she is trying to work on her kidney disease. Pt is requesting to have her gabapentin decreased to 600mg  instead of 800. Please advise.      [id number] - [place], [place] - [address]  [address] [place] [address]  Phone: [phone] Fax: [phone]  Hours: Not open 24 hours

## 2020-01-06 DIAGNOSIS — Z8679 Personal history of other diseases of the circulatory system: Secondary | ICD-10-CM | POA: Diagnosis not present

## 2020-01-06 DIAGNOSIS — Z8639 Personal history of other endocrine, nutritional and metabolic disease: Secondary | ICD-10-CM | POA: Diagnosis not present

## 2020-01-06 DIAGNOSIS — F17210 Nicotine dependence, cigarettes, uncomplicated: Secondary | ICD-10-CM | POA: Diagnosis not present

## 2020-01-06 DIAGNOSIS — R110 Nausea: Secondary | ICD-10-CM | POA: Diagnosis not present

## 2020-01-06 DIAGNOSIS — Z9049 Acquired absence of other specified parts of digestive tract: Secondary | ICD-10-CM | POA: Diagnosis not present

## 2020-01-06 DIAGNOSIS — R109 Unspecified abdominal pain: Secondary | ICD-10-CM | POA: Diagnosis not present

## 2020-01-06 DIAGNOSIS — I10 Essential (primary) hypertension: Secondary | ICD-10-CM | POA: Diagnosis not present

## 2020-01-06 DIAGNOSIS — R1011 Right upper quadrant pain: Secondary | ICD-10-CM | POA: Diagnosis not present

## 2020-01-08 ENCOUNTER — Ambulatory Visit: Payer: Medicare HMO | Admitting: Nurse Practitioner

## 2020-01-10 ENCOUNTER — Other Ambulatory Visit: Payer: Self-pay | Admitting: Family Medicine

## 2020-01-10 ENCOUNTER — Telehealth: Payer: Self-pay | Admitting: Family Medicine

## 2020-01-10 NOTE — Telephone Encounter (Signed)
Copied from [place] [id number]. Topic: General - Call Back - No Documentation >> Jan 10, 2020 11:25 AM Erick Blinks wrote: Reason for CRM: Pt called and is requesting a call back from the clinic, pt wants to know why her oxybutynin has been discontinued. Please advise  Best contact: [phone]

## 2020-01-11 ENCOUNTER — Other Ambulatory Visit: Payer: Self-pay | Admitting: Family Medicine

## 2020-01-11 NOTE — Telephone Encounter (Signed)
Requested medication (s) are due for refill today:   Yes  Requested medication (s) are on the active medication list:   Yes  Future visit scheduled:   Yes with Dr. [name]   Last ordered: Imdur 60 mg 09/25/2019 #30 0 refills.  Maximum refills reached.                       Hytrin 11/29/2019 #30 1 refill    Rx has expired.                        Zofran is non delegated    Requested Prescriptions  Pending Prescriptions Disp Refills   isosorbide mononitrate (IMDUR) 60 MG 24 hr tablet [Pharmacy Med Name: Isosorbide Mononitrate ER 60MG  TB24] 30 tablet 0    Sig: TAKE 1 TABLET BY MOUTH DAILY      Cardiovascular:  Nitrates Failed - 01/11/2020  4:25 PM      Failed - Last BP in normal range    BP Readings from Last 1 Encounters:  12/25/19 (!) 172/108          Passed - Last Heart Rate in normal range    Pulse Readings from Last 1 Encounters:  12/25/19 74          Passed - Valid encounter within last 12 months    Recent Outpatient Visits           4 weeks ago Abdominal pain, epigastric   [facility] at [facility], Ranell Patrick, MD   1 month ago Abdominal pain, epigastric   [facility] at [facility], Ranell Patrick, MD   3 months ago    Primary Care at [facility], Arlie Solomons, MD   4 months ago Essential hypertension   Primary Care at [facility], Arlie Solomons, MD   5 months ago Morbid obesity [facility])   Primary Care at [facility], Arlie Solomons, MD       Future Appointments             In 1 week [name] Ranell Patrick, MD Primary Care at [place], [name]   In 1 month [name] Ranell Patrick, MD Primary Care at [place], [facility]              ondansetron (ZOFRAN) 4 MG tablet [Pharmacy Med Name: Ondansetron HCl 4MG  TABS] 30 tablet 6    Sig: TAKE 1 TABLET BY MOUTH EVERY 8 HOURS AS NEEDED FOR NAUSEA OR VOMITING      Not Delegated - Gastroenterology: Antiemetics Failed - 01/11/2020  4:25 PM      Failed - This refill cannot be delegated      Passed - Valid encounter within last 6 months    Recent  Outpatient Visits           4 weeks ago Abdominal pain, epigastric   [facility] at [facility], Ranell Patrick, MD   1 month ago Abdominal pain, epigastric   Primary [facility] at [place], MD   3 months ago    Primary Care at [facility], Arlie Solomons, MD   4 months ago Essential hypertension   Primary Care at [facility], Arlie Solomons, MD   5 months ago Morbid obesity [facility])   Primary Care at [facility], Arlie Solomons, MD       Future Appointments             In 1 week [name] Ranell Patrick, MD Primary Care  at [place], [place]   In 1 month Wendie Agreste, MD Primary Care at [place], [facility]              terazosin (HYTRIN) 2 MG capsule [Pharmacy Med Name: Terazosin HCl 2MG  CAPS] 30 capsule 1    Sig: TAKE 1 CAPSULE BY MOUTH EVERY NIGHT AT BEDTIME      Cardiovascular:  Alpha Blockers Failed - 01/11/2020  4:25 PM      Failed - Last BP in normal range    BP Readings from Last 1 Encounters:  12/25/19 (!) 172/108          Passed - Valid encounter within last 6 months    Recent Outpatient Visits           4 weeks ago Abdominal pain, epigastric   [facility] at [facility], Ranell Patrick, MD   1 month ago Abdominal pain, epigastric   [facility] at [facility], Ranell Patrick, MD   3 months ago    Primary Care at [facility], Arlie Solomons, MD   4 months ago Essential hypertension   Primary Care at [facility], Arlie Solomons, MD   5 months ago Morbid obesity [facility])   Primary Care at [facility], Arlie Solomons, MD       Future Appointments             In 1 week [name] Ranell Patrick, MD Primary Care at [place], [facility]   In 1 month [name] Ranell Patrick, MD Primary Care at [place], [facility]

## 2020-01-11 NOTE — Telephone Encounter (Signed)
Pt is calling back checking on  Why oxybutynin was d/c

## 2020-01-11 NOTE — Telephone Encounter (Signed)
Pt is calling asking you to rerill her Oxybutin 5 mg XR once daily could you do this? She does have upcoming appt on 01/22/2020 with you

## 2020-01-12 ENCOUNTER — Other Ambulatory Visit: Payer: Self-pay | Admitting: Family Medicine

## 2020-01-12 DIAGNOSIS — F332 Major depressive disorder, recurrent severe without psychotic features: Secondary | ICD-10-CM | POA: Diagnosis not present

## 2020-01-12 MED ORDER — OXYBUTYNIN CHLORIDE ER 5 MG PO TB24: 5.0000 mg | ORAL_TABLET | Freq: Every day | ORAL | 0 refills | Status: DC

## 2020-01-12 NOTE — Telephone Encounter (Signed)
Refilled. Keep planned appointment.

## 2020-01-22 ENCOUNTER — Other Ambulatory Visit: Payer: Self-pay

## 2020-01-22 ENCOUNTER — Encounter: Payer: Self-pay | Admitting: Family Medicine

## 2020-01-22 ENCOUNTER — Ambulatory Visit (INDEPENDENT_AMBULATORY_CARE_PROVIDER_SITE_OTHER): Payer: Medicare HMO | Admitting: Family Medicine

## 2020-01-22 VITALS — BP 168/90 | HR 68 | Temp 98.0°F | Ht 62.0 in | Wt 243.0 lb

## 2020-01-22 DIAGNOSIS — G8929 Other chronic pain: Secondary | ICD-10-CM

## 2020-01-22 DIAGNOSIS — R1013 Epigastric pain: Secondary | ICD-10-CM | POA: Diagnosis not present

## 2020-01-22 DIAGNOSIS — R6889 Other general symptoms and signs: Secondary | ICD-10-CM | POA: Diagnosis not present

## 2020-01-22 DIAGNOSIS — M545 Low back pain, unspecified: Secondary | ICD-10-CM

## 2020-01-22 DIAGNOSIS — N184 Chronic kidney disease, stage 4 (severe): Secondary | ICD-10-CM | POA: Diagnosis not present

## 2020-01-22 DIAGNOSIS — I10 Essential (primary) hypertension: Secondary | ICD-10-CM

## 2020-01-22 MED ORDER — ATORVASTATIN CALCIUM 40 MG PO TABS: 40.0000 mg | ORAL_TABLET | Freq: Every day | ORAL | 0 refills | Status: DC

## 2020-01-22 MED ORDER — GABAPENTIN 800 MG PO TABS: 800.0000 mg | ORAL_TABLET | Freq: Three times a day (TID) | ORAL | 0 refills | Status: DC

## 2020-01-22 MED ORDER — SUCRALFATE 1 G PO TABS: 1.0000 g | ORAL_TABLET | Freq: Three times a day (TID) | ORAL | 0 refills | Status: DC

## 2020-01-22 NOTE — Progress Notes (Signed)
Subjective:  Patient ID: Sherry [name], female    DOB: [birth year]  Age: 64 y.o. MRN: [id number]  CC:  Chief Complaint  Patient presents with  . Follow-up    on abdominal pain and hypertension. Pt reports she sees GI on the 24th or 26th of next month. pt states she wpould like an increase on her BP medication because her BP is always high.   . Mass    pt has a few masses on her abdominal area and she thinks their might be fluid build up in one of the masses on her L side. pt reports no pain in theses areas.   . Medication Refill    pt would like a refill on Lipitor, Gabapentin pt states she takes 800mg  of this medication and the pt would like to discuss taking this medication 4x daily insted of 3. pt takes refill on Sucarlfate and Ingrezza pt would also like to discuss an increase on the ingrezza pt reports 80mg  isn't helping anymore.    HPI Sherry [name] presents for   Epigastric abdominal pain: Discussed at previous visits, last seen June 2.  had been seen in the ER, previous CT scan without concerning findings, she had been taking Carafate as well as PPI, potentially 2 different versions of PPI.  Concern for recurrence of gastritis versus peptic ulcer disease.  I continued her on Carafate, twice daily Protonix, and plan for gastroenterology eval quickly.  Short-term hydrocodone was provided on June 2. .   ER visit June 14 for abdominal pain, repeat CT scan reassuring, treated with Bentyl, plan for GI follow-up.   ER visit June 26 for abdominal pain. [place] [facility]  blood pressure was 223/115..  Abdominal ultrasound, cholecystectomy, postsurgical dilatation of the common bile duct unchanged since prior CT.  Normal WBC, hemoglobin 10.5. Creatinine 2.65 -similar to prior readings, history of CKD, followed by Dr. Carolin Sicks Lipase normal at 69.  Thought to have chronic upper abdominal pain.  No acute intra-abdominal findings noted.  Discharge blood pressure  185/100.  Given Catapres for discharge which was her evening medication.  Has not seen GI since our last visit.  Next appt with GI is in August - 8/24 - Dr. Bryan Lemma.  Pain about the same. No fever/n/v.  Dicyclomine -3 times per day as needed - only taking 3 times per week, no relief. Pain daily.     Lab Results  Component Value Date   CREATININE 2.56 (H) 12/25/2019    Hypertension:  With chronic kidney disease, CHF. Has had some persistent elevated readings.  Initially thought to have pain component.  Was taking Norvasc 10 mg, hydralazine 50 mg 3 times daily, labetalol 2 mg 3 times daily, terazosin 2 mg nightly at her May 17th visit.  Not sure if she was taking Lasix at that time. Home readings:  200/100 at times.  No current chest pain, no slurred speech or focal weakness.  No current headache.  No recent visit with Dr. Carolin Sicks.  Denies missed doses of meds.  Labetalol 200mg  BID Terazosin 2mg  qhs Amlodipine 10mg  qd Hydralazine 25mg  - written 1 pill tid, taking 2 pills tid.  Isosorbide 60mg  qd.  Had been off lasix, recently restarted in past 2 weeks. unknown dose.  Has been out of lipitor for awhile. Not fasting at present.  Lab Results  Component Value Date   ALT 6 12/25/2019   AST 10 (L) 12/25/2019   ALKPHOS 114 12/25/2019   BILITOT  0.2 (L) 12/25/2019     BP Readings from Last 3 Encounters:  01/22/20 (!) 168/90  12/25/19 (!) 172/108  12/13/19 (!) 164/82    Lab Results  Component Value Date   CREATININE 2.56 (H) 12/25/2019   Chronic back Pain: 2 prior surgeries. Treated by Dr. Nolon Rod prior with Gabapentin.  Tried 600mg  gabapentin tid, thinking it would be better on stomach - no change and back pain worse. Would like to return to 800mg  dose.  Taking 4 gabapentin per day of 600mg . Has half bottle left. Will bring to next visit.    On Ingrezza for tardive dyskinesia. Still having some mouth movements.  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       History Patient  Active Problem List   Diagnosis Date Noted  . Chronic diastolic CHF (congestive heart failure) ([place]) 05/09/2019  . GERD (gastroesophageal reflux disease) 05/09/2019  . Alcohol abuse 05/09/2019  . Hypertension 05/08/2019  . Bilateral low back pain with bilateral sciatica 01/05/2019  . Cyst of ovary   . Pre-operative cardiovascular examination 06/13/2018  . Dizziness 02/24/2018  . Left-sided weakness 02/24/2018  . Cigarette smoker 12/31/2017  . DOE (dyspnea on exertion) 12/30/2017  . Phlebitis after infusion 10/28/2017  . Hypertensive emergency 10/21/2017  . Microcytic anemia 10/21/2017  . Medication intolerance 09/06/2017  . Neuroleptic-induced tardive dyskinesia 09/06/2017  . Cancer ([place]) 09/06/2017  . Chronic low back pain 09/06/2017  . Grief reaction with prolonged bereavement 09/06/2017  . Opioid use disorder, severe, dependence ([place]) 08/27/2017  . Alcohol use disorder, severe, dependence ([place]) 08/27/2017  . Substance induced mood disorder ([place]) 08/13/2017  . AKI (acute kidney injury) ([place])   . MDD (major depressive disorder), recurrent severe, without psychosis ([place])   . Alcohol withdrawal ([place]) 08/09/2017  . Essential hypertension 07/06/2017  . Chronic kidney disease, stage III (moderate) 11/17/2016  . Depression with anxiety 08/21/2016  . Hx of bipolar disorder 01/31/2016  . History of breast cancer 02/13/2013  . Morbid obesity due to excess calories ([employer]) 02/13/2013  . Hepatitis C virus infection cured after antiviral drug therapy 12/17/2012   Past Medical History:  Diagnosis Date  . Anxiety   . Bipolar and related disorder ([place])   . Blood transfusion without reported diagnosis   . Breast cancer ([place])    right lumpectemy and lymph node removal  . CHF (congestive heart failure) ([place])   . Chronic back pain   . Chronic kidney disease    "Stage IV" - states due to hypertension  . Depression   . GERD (gastroesophageal reflux disease)   . Hepatitis C   . Hypertension    . Left-sided weakness   . MRSA infection    of breast incision  . Opioid dependence ([place])    Has been treated in [facility] in past  . Substance abuse ([place])   . TIA (transient ischemic attack) 2020   P/w BP >230/120 and neurologic symptoms, presumed TIA   Past Surgical History:  Procedure Laterality Date  . BACK SURGERY  1990  . BREAST SURGERY Right 2010   "breast cancer survivor" - states partial mastectomy and nodes  . COLONOSCOPY     [facility]  . ESOPHAGOGASTRODUODENOSCOPY     [facility]  . LAPAROSCOPIC CHOLECYSTECTOMY  2002  . LAPAROSCOPIC INCISIONAL / UMBILICAL / VENTRAL HERNIA REPAIR  04/13/2018   with BARD 15x 41YS mesh (supraumbilical)  . LAPAROSCOPIC LYSIS OF ADHESIONS  07/12/2018   Procedure: LAPAROSCOPIC LYSIS OF ADHESIONS;  Surgeon: Isabel Caprice,  MD;  Location: [facility];  Service: Gynecology;;  . MYOMECTOMY     x 2 prior to hysterectomy  . ROBOTIC ASSISTED BILATERAL SALPINGO OOPHERECTOMY Right 07/12/2018   Procedure: XI ROBOTIC ASSISTED RIGHT SALPINGO OOPHORECTOMY;  Surgeon: Isabel Caprice, MD;  Location: [facility];  Service: Gynecology;  Laterality: Right;  . TOTAL ABDOMINAL HYSTERECTOMY     fibroids   . UPPER GASTROINTESTINAL ENDOSCOPY     Allergies  Allergen Reactions  . Abilify [Aripiprazole] Other (See Comments)    Tardive dyskinesia Oral  . Remeron [Mirtazapine] Other (See Comments)    Wgt stimulation /gain, Dizziness, Patient says "can tolerate"  . Trazodone And Nefazodone Other (See Comments)    Nightmares/sleep diturbance  . Flexeril [Cyclobenzaprine] Other (See Comments)    Pt states Flexeril makes her feel depressed   . Amoxicillin Diarrhea and Other (See Comments)    NOTE the patient has had PCN WITHOUT reaction Has patient had a PCN reaction causing immediate rash, facial/tongue/throat swelling, SOB or lightheadedness with hypotension: No Has patient had a PCN reaction causing severe rash involving mucus membranes or skin  necrosis: No Has patient had a PCN reaction that required hospitalization: No Has patient had a PCN reaction occurring within the last 10 years: No If all of the above answers are "NO", then may proceed with Cephalosporin use.    Prior to Admission medications   Medication Sig Start Date End Date Taking? Authorizing Provider  amLODipine (NORVASC) 10 MG tablet Take 1 tablet (10 mg total) by mouth daily. 09/13/19  Yes Forrest Moron, MD  Cholecalciferol (VITAMIN D3) 50 MCG (2000 UT) capsule Take 2,000 Units by mouth daily.   Yes [provider]  dicyclomine (BENTYL) 20 MG tablet Take 1 tablet (20 mg total) by mouth 3 (three) times daily as needed for spasms. 12/25/19  Yes Davonna Belling, MD  FEROSUL 325 (65 Fe) MG tablet TAKE 1 TABLET BY MOUTH DAILY WITH BREAKFAST 11/29/19  Yes Wendie Agreste, MD  FLUoxetine (PROZAC) 40 MG capsule Take 40 mg by mouth daily.  11/09/18  Yes [provider]  gabapentin (NEURONTIN) 600 MG tablet Take 1 tablet (600 mg total) by mouth 3 (three) times daily. 01/04/20  Yes Wendie Agreste, MD  hydrALAZINE (APRESOLINE) 25 MG tablet Take 1 tablet (25 mg total) by mouth 3 (three) times daily. 05/11/19  Yes Danford, Suann Larry, MD  hydrOXYzine (VISTARIL) 25 MG capsule Take 25 mg by mouth 4 (four) times daily as needed for anxiety. 12/26/18  Yes [provider]  INGREZZA 80 MG CAPS Take 80 mg by mouth daily. 10/15/17  Yes [provider]  isosorbide mononitrate (IMDUR) 60 MG 24 hr tablet TAKE 1 TABLET BY MOUTH DAILY 01/12/20  Yes Wendie Agreste, MD  labetalol (NORMODYNE) 200 MG tablet Take 1 tablet (200 mg total) by mouth 2 (two) times daily. 05/11/19  Yes Danford, Suann Larry, MD  Multiple Vitamins-Minerals (ADULT GUMMY) CHEW Chew 1 tablet by mouth daily. 07/21/17  Yes [provider]  omeprazole (PRILOSEC) 20 MG capsule Take 20 mg by mouth daily.   Yes [provider]  ondansetron (ZOFRAN) 4 MG tablet TAKE 1 TABLET  BY MOUTH EVERY 8 HOURS AS NEEDED FOR NAUSEA OR VOMITING 01/12/20  Yes Wendie Agreste, MD  oxybutynin (DITROPAN-XL) 5 MG 24 hr tablet TAKE 1 TABLET(5 MG) BY MOUTH AT BEDTIME 01/12/20  Yes Wendie Agreste, MD  pantoprazole (PROTONIX) 20 MG tablet Take 1 tablet (20 mg total) by mouth  2 (two) times daily. 11/27/19  Yes Wendie Agreste, MD  sucralfate (CARAFATE) 1 g tablet Take 1 tablet (1 g total) by mouth 4 (four) times daily -  with meals and at bedtime. 11/18/19  Yes Plunkett, Loree Fee, MD  terazosin (HYTRIN) 2 MG capsule TAKE 1 CAPSULE BY MOUTH EVERY NIGHT AT BEDTIME 01/12/20  Yes Wendie Agreste, MD  atorvastatin (LIPITOR) 40 MG tablet Take 1 tablet (40 mg total) by mouth daily at 6 PM. Patient not taking: Reported on 01/22/2020 05/11/19   Edwin Dada, MD   Social History   Socioeconomic History  . Marital status: Single    Spouse name: Not on file  . Number of children: 0  . Years of education: 32  . Highest education level: High school graduate  Occupational History  . Occupation: Disabled  Tobacco Use  . Smoking status: Current Every Day Smoker    Packs/day: 0.25    Types: Cigarettes  . Smokeless tobacco: Never Used  . Tobacco comment: 4 cigs per day 01/2019  Vaping Use  . Vaping Use: Former  . Start date: 06/27/2013  . Quit date: 06/14/2017  . Devices: Apple Cinnamon-0 mg  Substance and Sexual Activity  . Alcohol use: No  . Drug use: Not Currently    Comment: h/o IVD use  . Sexual activity: Not Currently  Other Topics Concern  . Not on file  Social History Narrative   Lives at home alone.   Right-handed.   Caffeine use: 4 cups coffee/soda   Social Determinants of Health   Financial Resource Strain:   . Difficulty of Paying Living Expenses:   Food Insecurity:   . Worried About Charity fundraiser in the Last Year:   . Arboriculturist in the Last Year:   Transportation Needs:   . Film/video editor (Medical):   Marland Kitchen Lack of Transportation (Non-Medical):     Physical Activity:   . Days of Exercise per Week:   . Minutes of Exercise per Session:   Stress:   . Feeling of Stress :   Social Connections:   . Frequency of Communication with Friends and Family:   . Frequency of Social Gatherings with Friends and Family:   . Attends Religious Services:   . Active Member of Clubs or Organizations:   . Attends Archivist Meetings:   Marland Kitchen Marital Status:   Intimate Partner Violence:   . Fear of Current or Ex-Partner:   . Emotionally Abused:   Marland Kitchen Physically Abused:   . Sexually Abused:     Review of Systems Per HPI.   Objective:   Vitals:   01/22/20 1657 01/22/20 1716  BP: (!) 175/78 (!) 168/90  Pulse: 68   Temp: 98 F (36.7 C)   TempSrc: Temporal   SpO2: 98%   Weight: 243 lb (110.2 kg)   Height: 5\' 2"  (1.575 m)      Physical Exam Vitals reviewed.  Constitutional:      Appearance: She is well-developed.  HENT:     Head: Normocephalic and atraumatic.  Eyes:     Conjunctiva/sclera: Conjunctivae normal.     Pupils: Pupils are equal, round, and reactive to light.  Neck:     Vascular: No carotid bruit.  Cardiovascular:     Rate and Rhythm: Normal rate and regular rhythm.     Heart sounds: Normal heart sounds.  Pulmonary:     Effort: Pulmonary effort is normal.     Breath sounds:  Normal breath sounds.  Abdominal:     General: Abdomen is flat.     Palpations: Abdomen is soft. There is no pulsatile mass.     Tenderness: There is abdominal tenderness (epigastric).     Comments: Areas of her concern - 2 thickened hyperpigmented patches, Left side, no induration. Appear to be scar tissue. No focal ttp.   Skin:    General: Skin is warm and dry.  Neurological:     Mental Status: She is alert and oriented to person, place, and time.  Psychiatric:        Behavior: Behavior normal.      Assessment & Plan:  Sherry [name] is a 64 y.o. female . Abdominal pain, epigastric - Plan: sucralfate (CARAFATE) 1 g tablet  -Previous  work-up as above, may have component of chronic pain, previous imaging overall reassuring including ER visits.  Reports similar symptoms.  Desipramine as needed, and follow-up with gastroenterology with ER precautions given  -.  Areas of concern on her abdominal wall appeared to be scar tissue, no cyst/mass/abscess appreciated.  Chronic low back pain, unspecified back pain laterality, unspecified whether sciatica present - Plan: gabapentin (NEURONTIN) 800 MG tablet, DISCONTINUED: gabapentin (NEURONTIN) 800 MG tablet  -Did not tolerate lower dose, restart on higher dose gabapentin.  Essential hypertension CKD (chronic kidney disease) stage 4, GFR 15-29 ml/min (HCC)  -Questionable pain component previously, but still elevated in office.  Unsure of furosemide dosing, previously unsure that she was taking furosemide.  May need adjustment in regimen, but followed by nephrology.  May need to coordinate changes with her specialist.  Recheck 10 days, ER precautions given.  Tardive dyskinesia symptoms discussed, but asked that she discuss changes with Ingrezza or adjustments to other medications with her managing psychiatrist.   Meds ordered this encounter  Medications  . atorvastatin (LIPITOR) 40 MG tablet    Sig: Take 1 tablet (40 mg total) by mouth daily at 6 PM.    Dispense:  30 tablet    Refill:  0  . sucralfate (CARAFATE) 1 g tablet    Sig: Take 1 tablet (1 g total) by mouth 4 (four) times daily -  with meals and at bedtime.    Dispense:  90 tablet    Refill:  0  . DISCONTD: gabapentin (NEURONTIN) 800 MG tablet    Sig: Take 1 tablet (800 mg total) by mouth 3 (three) times daily.    Dispense:  90 tablet    Refill:  0  . gabapentin (NEURONTIN) 800 MG tablet    Sig: Take 1 tablet (800 mg total) by mouth 3 (three) times daily.    Dispense:  90 tablet    Refill:  0   Patient Instructions    You can try dicyclomine when you have abdominal cramps, but I would like to talk to GI about your  stomach issues.   Ask psychiatrist about Ingrezza and any changes for tardive symptoms.   I restarted the 800 mg gabapentin dose - can discuss further next visit.  I restarted lipitor - can check bloodwork next visit.   We will need to discuss your blood pressure with nephrology to decide on other changes.  If you have high blood pressures especially associated with any new headaches, chest pain, shortness of breath, or weakness, be seen in the emergency room or call 911.  Recheck in 10 days.    Return to the clinic or go to the nearest emergency room if any of your symptoms  worsen or new symptoms occur.    If you have lab work done today you will be contacted with your lab results within the next 2 weeks.  If you have not heard from [place] then please contact us. The fastest way to get your results is to register for My Chart.   IF you received an x-ray today, you will receive an invoice from [facility]. Please contact [facility] at [phone] with questions or concerns regarding your invoice.   IF you received labwork today, you will receive an invoice from [place] [facility]. Please contact [facility] at [phone] with questions or concerns regarding your invoice.   Our billing staff will not be able to assist you with questions regarding bills from these companies.  You will be contacted with the lab results as soon as they are available. The fastest way to get your results is to activate your My Chart account. Instructions are located on the last page of this paperwork. If you have not heard from [place] regarding the results in 2 weeks, please contact this office.         Signed, Merri Ray, MD [facility]

## 2020-01-22 NOTE — Patient Instructions (Addendum)
  You can try dicyclomine when you have abdominal cramps, but I would like to talk to GI about your stomach issues.   Ask psychiatrist about Ingrezza and any changes for tardive symptoms.   I restarted the 800 mg gabapentin dose - can discuss further next visit.  I restarted lipitor - can check bloodwork next visit.   We will need to discuss your blood pressure with nephrology to decide on other changes.  If you have high blood pressures especially associated with any new headaches, chest pain, shortness of breath, or weakness, be seen in the emergency room or call 911.  Recheck in 10 days.    Return to the clinic or go to the nearest emergency room if any of your symptoms worsen or new symptoms occur.    If you have lab work done today you will be contacted with your lab results within the next 2 weeks.  If you have not heard from [place] then please contact us. The fastest way to get your results is to register for My Chart.   IF you received an x-ray today, you will receive an invoice from [facility]. Please contact [facility] at [phone] with questions or concerns regarding your invoice.   IF you received labwork today, you will receive an invoice from [facility]. Please contact [facility] at [phone] with questions or concerns regarding your invoice.   Our billing staff will not be able to assist you with questions regarding bills from these companies.  You will be contacted with the lab results as soon as they are available. The fastest way to get your results is to activate your My Chart account. Instructions are located on the last page of this paperwork. If you have not heard from [place] regarding the results in 2 weeks, please contact this office.

## 2020-01-30 ENCOUNTER — Encounter: Payer: Self-pay | Admitting: Family Medicine

## 2020-02-01 ENCOUNTER — Ambulatory Visit: Payer: Medicare HMO | Admitting: Family Medicine

## 2020-02-01 DIAGNOSIS — R6889 Other general symptoms and signs: Secondary | ICD-10-CM | POA: Diagnosis not present

## 2020-02-02 ENCOUNTER — Encounter: Payer: Self-pay | Admitting: Family Medicine

## 2020-02-09 ENCOUNTER — Ambulatory Visit: Payer: Medicare HMO | Admitting: Family Medicine

## 2020-02-20 ENCOUNTER — Other Ambulatory Visit: Payer: Self-pay | Admitting: Family Medicine

## 2020-02-20 DIAGNOSIS — M545 Low back pain, unspecified: Secondary | ICD-10-CM

## 2020-02-20 DIAGNOSIS — G8929 Other chronic pain: Secondary | ICD-10-CM

## 2020-02-26 ENCOUNTER — Other Ambulatory Visit: Payer: Self-pay

## 2020-02-26 ENCOUNTER — Encounter: Payer: Self-pay | Admitting: Family Medicine

## 2020-02-26 ENCOUNTER — Ambulatory Visit (INDEPENDENT_AMBULATORY_CARE_PROVIDER_SITE_OTHER): Payer: Medicare HMO | Admitting: Family Medicine

## 2020-02-26 DIAGNOSIS — M545 Low back pain: Secondary | ICD-10-CM

## 2020-02-26 DIAGNOSIS — I10 Essential (primary) hypertension: Secondary | ICD-10-CM

## 2020-02-26 DIAGNOSIS — R1013 Epigastric pain: Secondary | ICD-10-CM | POA: Diagnosis not present

## 2020-02-26 DIAGNOSIS — N184 Chronic kidney disease, stage 4 (severe): Secondary | ICD-10-CM

## 2020-02-26 DIAGNOSIS — G8929 Other chronic pain: Secondary | ICD-10-CM

## 2020-02-26 DIAGNOSIS — Z1231 Encounter for screening mammogram for malignant neoplasm of breast: Secondary | ICD-10-CM | POA: Diagnosis not present

## 2020-02-26 DIAGNOSIS — Z6841 Body Mass Index (BMI) 40.0 and over, adult: Secondary | ICD-10-CM | POA: Diagnosis not present

## 2020-02-26 DIAGNOSIS — Z8719 Personal history of other diseases of the digestive system: Secondary | ICD-10-CM

## 2020-02-26 DIAGNOSIS — R6889 Other general symptoms and signs: Secondary | ICD-10-CM | POA: Diagnosis not present

## 2020-02-26 MED ORDER — HYDROCODONE-ACETAMINOPHEN 5-325 MG PO TABS: 1.0000 | ORAL_TABLET | Freq: Four times a day (QID) | ORAL | 0 refills | Status: DC | PRN

## 2020-02-26 MED ORDER — DICYCLOMINE HCL 10 MG PO CAPS: 10.0000 mg | ORAL_CAPSULE | Freq: Three times a day (TID) | ORAL | 1 refills | Status: DC | PRN

## 2020-02-26 NOTE — Progress Notes (Signed)
Subjective:  Patient ID: Sherry [name], female    DOB: 07-Jun-1956  Age: 64 y.o. MRN: [id number]  CC:  Chief Complaint  Patient presents with  . Establish Care    Pt reports she is still having abdominal pain. no other concerns at this time.  . health matrix    BMI 400-499 ICD-10 - M546, Morbid obesity ICD-10 - T0354, and Chronic kidney disease ICD-10 - N184    HPI Sherry [name] presents for initial transition of care, but has seen previously.  Follow-up with prior concerns, chronic issues as above.  Abdominal pain: Possible component of chronic pain, previous imaging reassuring including through ER visits.  Dicyclomine as needed discussed.  Plan for GI follow-up August 24. Endoscopy in July 2020 - gastritis. Still taking carafate and BID protonix. No alcohol, no nsaids.  Same pain present daily.  Thinks she is taking dicyclomine tid, no known change in pain. No recent fever, vomiting. BM's QD.   Prior hydrocodone short term rx - took up to twice per day and helped.  Controlled substance database (PDMP) reviewed. No concerns appreciated. Last Rx #20 on 12/13/19.  No dark stools. hgb has been averaging around 10.5 recently.  Lab Results  Component Value Date   WBC 6.3 12/25/2019   HGB 10.5 (L) 12/25/2019   HCT 33.8 (L) 12/25/2019   MCV 87.6 12/25/2019   PLT 255 12/25/2019     Hypertension: With chronic kidney disease.  Nephrologist Dr. Carolin Sicks in 2 days.  Elevated readings last visit, stable today. No changes in meds.  Recent creatinine range approximately 2.53-2.56.  Highest 2.93 October 2020. Bubbles in urine at times past month. .no dysuria, urgency, hematuria, or new abd pain.  Home readings: BP Readings from Last 3 Encounters:  02/26/20 138/86  01/22/20 (!) 168/90  12/25/19 (!) 172/108   Lab Results  Component Value Date   CREATININE 2.56 (H) 12/25/2019   Chronic back pain 2 prior surgeries in the 1990's.  Has been treated with gabapentin, 800 mg 3 times daily, tried  lower dose of 600 mg that did not work.  Thought that might affect her stomach but no change. Still taking 800mg  tid. Still having pain. Would like to meet with back specialist to discuss options.   MRI LS spine 04/06/2019: IMPRESSION: This MRI of the lumbar spine shows multilevel degenerative changes as detailed above.  The most significant findings are: 1.   At L3-L4 there are degenerative changes causing mild right foraminal narrowing but no nerve root compression or spinal stenosis. 2.   At L4-L5, there are degenerative changes with severe facet hypertrophy causing moderate right lateral recess stenosis and mild foraminal narrowing at left lateral recess stenosis.  There is no nerve root compression or spinal stenosis. 3.   At L5-S1, there are degenerative changes causing mild foraminal narrowing and moderately severe right lateral recess stenosis with potential for right S1 nerve root compression.  There is no spinal stenosis.  Obesity with BMI 44.26 Weight down 3 pounds from June.  Cut back on soda 1 per day, prior 4-5 per day since last visit in July.  3-4 glasses sweet tea per day.  No fast food.   Wt Readings from Last 3 Encounters:  02/26/20 242 lb (109.8 kg)  01/22/20 243 lb (110.2 kg)  12/25/19 245 lb (111.1 kg)    Health maintenance: COVID-19 vaccine: Moderna 4-5 months ago.  Mammogram: agrees to screening. Had L breast CA in 2014, no recent mammogram. [facility]  History Patient Active Problem List   Diagnosis Date Noted  . Chronic diastolic CHF (congestive heart failure) ([place]) 05/09/2019  . GERD (gastroesophageal reflux disease) 05/09/2019  . Alcohol abuse 05/09/2019  . Hypertension 05/08/2019  . Bilateral low back pain with bilateral sciatica 01/05/2019  . Cyst of ovary   . Pre-operative cardiovascular examination 06/13/2018  . Dizziness 02/24/2018  . Left-sided weakness 02/24/2018  . Cigarette smoker 12/31/2017  . DOE (dyspnea on  exertion) 12/30/2017  . Phlebitis after infusion 10/28/2017  . Hypertensive emergency 10/21/2017  . Microcytic anemia 10/21/2017  . Medication intolerance 09/06/2017  . Neuroleptic-induced tardive dyskinesia 09/06/2017  . Cancer ([place]) 09/06/2017  . Chronic low back pain 09/06/2017  . Grief reaction with prolonged bereavement 09/06/2017  . Opioid use disorder, severe, dependence ([place]) 08/27/2017  . Alcohol use disorder, severe, dependence ([place]) 08/27/2017  . Substance induced mood disorder ([place]) 08/13/2017  . AKI (acute kidney injury) ([place])   . MDD (major depressive disorder), recurrent severe, without psychosis ([place])   . Alcohol withdrawal ([place]) 08/09/2017  . Essential hypertension 07/06/2017  . Chronic kidney disease, stage III (moderate) 11/17/2016  . Depression with anxiety 08/21/2016  . Hx of bipolar disorder 01/31/2016  . History of breast cancer 02/13/2013  . Morbid obesity due to excess calories ([place]) 02/13/2013  . Hepatitis C virus infection cured after antiviral drug therapy 12/17/2012   Past Medical History:  Diagnosis Date  . Anxiety   . Bipolar and related disorder ([place])   . Blood transfusion without reported diagnosis   . Breast cancer ([place])    right lumpectemy and lymph node removal  . CHF (congestive heart failure) ([place])   . Chronic back pain   . Chronic kidney disease    "Stage IV" - states due to hypertension  . Depression   . GERD (gastroesophageal reflux disease)   . Hepatitis C   . Hypertension   . Left-sided weakness   . MRSA infection    of breast incision  . Opioid dependence ([place])    Has been treated in [facility] in past  . Substance abuse ([place])   . TIA (transient ischemic attack) 2020   P/w BP >230/120 and neurologic symptoms, presumed TIA   Past Surgical History:  Procedure Laterality Date  . BACK SURGERY  1990  . BREAST SURGERY Right 2010   "breast cancer survivor" - states partial mastectomy and nodes  . COLONOSCOPY     [facility]  . ESOPHAGOGASTRODUODENOSCOPY     [facility]  . LAPAROSCOPIC CHOLECYSTECTOMY  2002  . LAPAROSCOPIC INCISIONAL / UMBILICAL / VENTRAL HERNIA REPAIR  04/13/2018   with BARD 15x 24OX mesh (supraumbilical)  . LAPAROSCOPIC LYSIS OF ADHESIONS  07/12/2018   Procedure: LAPAROSCOPIC LYSIS OF ADHESIONS;  Surgeon: Isabel Caprice, MD;  Location: [facility];  Service: Gynecology;;  . MYOMECTOMY     x 2 prior to hysterectomy  . ROBOTIC ASSISTED BILATERAL SALPINGO OOPHERECTOMY Right 07/12/2018   Procedure: XI ROBOTIC ASSISTED RIGHT SALPINGO OOPHORECTOMY;  Surgeon: Isabel Caprice, MD;  Location: [facility];  Service: Gynecology;  Laterality: Right;  . TOTAL ABDOMINAL HYSTERECTOMY     fibroids   . UPPER GASTROINTESTINAL ENDOSCOPY     Allergies  Allergen Reactions  . Abilify [Aripiprazole] Other (See Comments)    Tardive dyskinesia Oral  . Remeron [Mirtazapine] Other (See Comments)    Wgt stimulation /gain, Dizziness, Patient says "can tolerate"  . Trazodone And Nefazodone Other (See Comments)  Nightmares/sleep diturbance  . Flexeril [Cyclobenzaprine] Other (See Comments)    Pt states Flexeril makes her feel depressed   . Amoxicillin Diarrhea and Other (See Comments)    NOTE the patient has had PCN WITHOUT reaction Has patient had a PCN reaction causing immediate rash, facial/tongue/throat swelling, SOB or lightheadedness with hypotension: No Has patient had a PCN reaction causing severe rash involving mucus membranes or skin necrosis: No Has patient had a PCN reaction that required hospitalization: No Has patient had a PCN reaction occurring within the last 10 years: No If all of the above answers are "NO", then may proceed with Cephalosporin use.    Prior to Admission medications   Medication Sig Start Date End Date Taking? Authorizing Provider  amLODipine (NORVASC) 10 MG tablet Take 1 tablet (10 mg total) by mouth daily. 09/13/19  Yes Forrest Moron, MD  atorvastatin (LIPITOR)  40 MG tablet Take 1 tablet (40 mg total) by mouth daily at 6 PM. 01/22/20  Yes Wendie Agreste, MD  Cholecalciferol (VITAMIN D3) 50 MCG (2000 UT) capsule Take 2,000 Units by mouth daily.   Yes [provider]  dicyclomine (BENTYL) 20 MG tablet Take 1 tablet (20 mg total) by mouth 3 (three) times daily as needed for spasms. 12/25/19  Yes Davonna Belling, MD  FEROSUL 325 (65 Fe) MG tablet TAKE 1 TABLET BY MOUTH DAILY WITH BREAKFAST 11/29/19  Yes Wendie Agreste, MD  FLUoxetine (PROZAC) 40 MG capsule Take 40 mg by mouth daily.  11/09/18  Yes [provider]  gabapentin (NEURONTIN) 800 MG tablet TAKE 1 TABLET(800 MG) BY MOUTH THREE TIMES DAILY 02/20/20  Yes Wendie Agreste, MD  hydrALAZINE (APRESOLINE) 25 MG tablet Take 1 tablet (25 mg total) by mouth 3 (three) times daily. 05/11/19  Yes Danford, Suann Larry, MD  hydrOXYzine (VISTARIL) 25 MG capsule Take 25 mg by mouth 4 (four) times daily as needed for anxiety. 12/26/18  Yes [provider]  INGREZZA 80 MG CAPS Take 80 mg by mouth daily. 10/15/17  Yes [provider]  isosorbide mononitrate (IMDUR) 60 MG 24 hr tablet TAKE 1 TABLET BY MOUTH DAILY 01/12/20  Yes Wendie Agreste, MD  labetalol (NORMODYNE) 200 MG tablet Take 1 tablet (200 mg total) by mouth 2 (two) times daily. 05/11/19  Yes Danford, Suann Larry, MD  Multiple Vitamins-Minerals (ADULT GUMMY) CHEW Chew 1 tablet by mouth daily. 07/21/17  Yes [provider]  omeprazole (PRILOSEC) 20 MG capsule Take 20 mg by mouth daily.   Yes [provider]  ondansetron (ZOFRAN) 4 MG tablet TAKE 1 TABLET BY MOUTH EVERY 8 HOURS AS NEEDED FOR NAUSEA OR VOMITING 01/12/20  Yes Wendie Agreste, MD  oxybutynin (DITROPAN-XL) 5 MG 24 hr tablet TAKE 1 TABLET(5 MG) BY MOUTH AT BEDTIME 01/12/20  Yes Wendie Agreste, MD  pantoprazole (PROTONIX) 20 MG tablet Take 1 tablet (20 mg total) by mouth 2 (two) times daily. 11/27/19  Yes Wendie Agreste, MD  sucralfate  (CARAFATE) 1 g tablet Take 1 tablet (1 g total) by mouth 4 (four) times daily -  with meals and at bedtime. 01/22/20  Yes Wendie Agreste, MD  terazosin (HYTRIN) 2 MG capsule TAKE 1 CAPSULE BY MOUTH EVERY NIGHT AT BEDTIME 01/12/20  Yes Wendie Agreste, MD   Social History   Socioeconomic History  . Marital status: Single    Spouse name: Not on file  . Number of children: 0  . Years of education: 99  .  Highest education level: High school graduate  Occupational History  . Occupation: Disabled  Tobacco Use  . Smoking status: Current Every Day Smoker    Packs/day: 0.25    Types: Cigarettes  . Smokeless tobacco: Never Used  . Tobacco comment: 4 cigs per day 01/2019  Vaping Use  . Vaping Use: Former  . Start date: 06/27/2013  . Quit date: 06/14/2017  . Devices: Apple Cinnamon-0 mg  Substance and Sexual Activity  . Alcohol use: No  . Drug use: Not Currently    Comment: h/o IVD use  . Sexual activity: Not Currently  Other Topics Concern  . Not on file  Social History Narrative   Lives at home alone.   Right-handed.   Caffeine use: 4 cups coffee/soda   Social Determinants of Health   Financial Resource Strain:   . Difficulty of Paying Living Expenses:   Food Insecurity:   . Worried About Charity fundraiser in the Last Year:   . Arboriculturist in the Last Year:   Transportation Needs:   . Film/video editor (Medical):   Marland Kitchen Lack of Transportation (Non-Medical):   Physical Activity:   . Days of Exercise per Week:   . Minutes of Exercise per Session:   Stress:   . Feeling of Stress :   Social Connections:   . Frequency of Communication with Friends and Family:   . Frequency of Social Gatherings with Friends and Family:   . Attends Religious Services:   . Active Member of Clubs or Organizations:   . Attends Archivist Meetings:   Marland Kitchen Marital Status:   Intimate Partner Violence:   . Fear of Current or Ex-Partner:   . Emotionally Abused:   Marland Kitchen Physically  Abused:   . Sexually Abused:     Review of Systems Per HPI.   Objective:   Vitals:   02/26/20 1411 02/26/20 1417 02/26/20 1421  BP: (!) 197/89 (!) 150/88 138/86  Pulse: 79    Temp: 98.4 F (36.9 C)    TempSrc: Temporal    SpO2: 99%    Weight: 242 lb (109.8 kg)    Height: 5\' 2"  (1.575 m)       Physical Exam Vitals reviewed.  Constitutional:      General: She is not in acute distress.    Appearance: She is well-developed. She is obese. She is not ill-appearing, toxic-appearing or diaphoretic.  HENT:     Head: Normocephalic and atraumatic.  Eyes:     Conjunctiva/sclera: Conjunctivae normal.     Pupils: Pupils are equal, round, and reactive to light.  Neck:     Vascular: No carotid bruit.  Cardiovascular:     Rate and Rhythm: Normal rate and regular rhythm.     Heart sounds: Normal heart sounds.  Pulmonary:     Effort: Pulmonary effort is normal.     Breath sounds: Normal breath sounds.  Abdominal:     Palpations: Abdomen is soft. There is no pulsatile mass.     Tenderness: There is abdominal tenderness (episgastric. ).  Skin:    General: Skin is warm and dry.  Neurological:     Mental Status: She is alert and oriented to person, place, and time.  Psychiatric:        Behavior: Behavior normal.       40 minutes spent during visit, greater than 50% counseling and assimilation of information, chart review, and discussion of plan.   Results for orders placed or  performed in visit on 02/26/20  CBC  Result Value Ref Range   WBC 6.6 3.4 - 10.8 x10E3/uL   RBC 3.77 3.77 - 5.28 x10E6/uL   Hemoglobin 10.0 (L) 11.1 - 15.9 g/dL   Hematocrit 31.3 (L) 34.0 - 46.6 %   MCV 83 79 - 97 fL   MCH 26.5 (L) 26.6 - 33.0 pg   MCHC 31.9 31 - 35 g/dL   RDW 15.7 (H) 11.7 - 15.4 %   Platelets 263 150 - 450 x10E3/uL     Assessment & Plan:  Sherry [name] is a 64 y.o. female . Morbid obesity ([place]) BMI 40.0-44.9, adult ([place])  -Discussed impact of sugar-containing beverages, try  to avoid as much as possible, water is best.  Watch portions.  CKD (chronic kidney disease) stage 4, GFR 15-29 ml/min (HCC)  -Plan follow-up with nephrologist soon, continue to avoid NSAIDs, hydration as above.  Essential hypertension  -Improved control on recheck, no med changes.  Nephrology follow-up as planned  Chronic low back pain, unspecified back pain laterality, unspecified whether sciatica present - Plan: Ambulatory referral to Spine Surgery  -Chronic low back pain with degenerative disc disease, moderate relief with gabapentin.  Will refer to back specialist to evaluate other treatment options.  History of gastritis - Plan: CBC Abdominal pain, epigastric - Plan: dicyclomine (BENTYL) 10 MG capsule, HYDROcodone-acetaminophen (NORCO/VICODIN) 5-325 MG tablet, CBC  -Ongoing abdominal pain with previous reassuring work-up including ER visits and imaging.  History of gastritis, but has been on twice daily proton pump inhibitor as well as Carafate, CBC has been stable, denies any bleeding, repeat CBC obtained with ER precautions, follow-up with gastroenterologist in the next few weeks as planned.  -Minimal decrease in hemoglobin on CBC, will have her repeat that in the next week to 10 days with RTC precautions if any signs or symptoms of worsening anemia.  Encounter for screening mammogram for malignant neoplasm of breast - Plan: MM Digital Screening  -Due for mammogram, order placed, history of breast cancer.   Meds ordered this encounter  Medications  . dicyclomine (BENTYL) 10 MG capsule    Sig: Take 1 capsule (10 mg total) by mouth 3 (three) times daily as needed for spasms.    Dispense:  30 capsule    Refill:  1  . HYDROcodone-acetaminophen (NORCO/VICODIN) 5-325 MG tablet    Sig: Take 1 tablet by mouth every 6 (six) hours as needed for moderate pain.    Dispense:  20 tablet    Refill:  0   Patient Instructions   Keep follow-up with your nephrologist as planned.  Discussed the  bubbles in urine at that appointment.  Keep appointment with stomach specialist - can try dicyclomine up to 3 times per day for spasms in stomach. If no relief then do not need to continue that medicine.   I did write for a few hydrocodone for now, but only use that medicine if dicyclomine is not helpful . If any increasing or worsening pain be seen in the emergency room. Return to the clinic or go to the nearest emergency room if any of your symptoms worsen or new symptoms occur.  Continue to cut back on sugar containing beverages. Good work with cutting back on sodas.   I will refer you for mammogram.   If you have lab work done today you will be contacted with your lab results within the next 2 weeks.  If you have not heard from [place] then please contact us. The  fastest way to get your results is to register for My Chart.   IF you received an x-ray today, you will receive an invoice from [facility]. Please contact [facility] at [phone] with questions or concerns regarding your invoice.   IF you received labwork today, you will receive an invoice from [facility]. Please contact [facility] at [phone] with questions or concerns regarding your invoice.   Our billing staff will not be able to assist you with questions regarding bills from these companies.  You will be contacted with the lab results as soon as they are available. The fastest way to get your results is to activate your My Chart account. Instructions are located on the last page of this paperwork. If you have not heard from [place] regarding the results in 2 weeks, please contact this office.         Signed, Merri Ray, MD [facility]

## 2020-02-26 NOTE — Patient Instructions (Addendum)
Keep follow-up with your nephrologist as planned.  Discussed the bubbles in urine at that appointment.  Keep appointment with stomach specialist - can try dicyclomine up to 3 times per day for spasms in stomach. If no relief then do not need to continue that medicine.   I did write for a few hydrocodone for now, but only use that medicine if dicyclomine is not helpful . If any increasing or worsening pain be seen in the emergency room. Return to the clinic or go to the nearest emergency room if any of your symptoms worsen or new symptoms occur.  Continue to cut back on sugar containing beverages. Good work with cutting back on sodas.   I will refer you for mammogram.   If you have lab work done today you will be contacted with your lab results within the next 2 weeks.  If you have not heard from [place] then please contact us. The fastest way to get your results is to register for My Chart.   IF you received an x-ray today, you will receive an invoice from [facility]. Please contact [facility] at [phone] with questions or concerns regarding your invoice.   IF you received labwork today, you will receive an invoice from [place]. Please contact [facility] at [phone] with questions or concerns regarding your invoice.   Our billing staff will not be able to assist you with questions regarding bills from these companies.  You will be contacted with the lab results as soon as they are available. The fastest way to get your results is to activate your My Chart account. Instructions are located on the last page of this paperwork. If you have not heard from [place] regarding the results in 2 weeks, please contact this office.

## 2020-02-27 ENCOUNTER — Encounter: Payer: Self-pay | Admitting: Family Medicine

## 2020-02-27 LAB — CBC
Hematocrit: 31.3 % — ABNORMAL LOW (ref 34.0–46.6)
Hemoglobin: 10 g/dL — ABNORMAL LOW (ref 11.1–15.9)
MCH: 26.5 pg — ABNORMAL LOW (ref 26.6–33.0)
MCHC: 31.9 g/dL (ref 31.5–35.7)
MCV: 83 fL (ref 79–97)
Platelets: 263 10*3/uL (ref 150–450)
RBC: 3.77 x10E6/uL (ref 3.77–5.28)
RDW: 15.7 % — ABNORMAL HIGH (ref 11.7–15.4)
WBC: 6.6 10*3/uL (ref 3.4–10.8)

## 2020-02-28 DIAGNOSIS — I129 Hypertensive chronic kidney disease with stage 1 through stage 4 chronic kidney disease, or unspecified chronic kidney disease: Secondary | ICD-10-CM | POA: Diagnosis not present

## 2020-02-28 DIAGNOSIS — D631 Anemia in chronic kidney disease: Secondary | ICD-10-CM | POA: Diagnosis not present

## 2020-02-28 DIAGNOSIS — N184 Chronic kidney disease, stage 4 (severe): Secondary | ICD-10-CM | POA: Diagnosis not present

## 2020-02-28 DIAGNOSIS — G8929 Other chronic pain: Secondary | ICD-10-CM | POA: Diagnosis not present

## 2020-02-28 DIAGNOSIS — M549 Dorsalgia, unspecified: Secondary | ICD-10-CM | POA: Diagnosis not present

## 2020-02-28 DIAGNOSIS — R6889 Other general symptoms and signs: Secondary | ICD-10-CM | POA: Diagnosis not present

## 2020-03-04 ENCOUNTER — Telehealth: Payer: Self-pay | Admitting: *Deleted

## 2020-03-04 NOTE — Telephone Encounter (Signed)
Dicyclomine was approved   Pharmacy notified

## 2020-03-05 ENCOUNTER — Telehealth: Payer: Self-pay | Admitting: Gastroenterology

## 2020-03-05 ENCOUNTER — Ambulatory Visit (INDEPENDENT_AMBULATORY_CARE_PROVIDER_SITE_OTHER): Payer: Medicare HMO | Admitting: Gastroenterology

## 2020-03-05 ENCOUNTER — Encounter: Payer: Self-pay | Admitting: Gastroenterology

## 2020-03-05 VITALS — BP 140/74 | HR 74 | Ht 62.0 in | Wt 239.5 lb

## 2020-03-05 DIAGNOSIS — R1013 Epigastric pain: Secondary | ICD-10-CM

## 2020-03-05 DIAGNOSIS — R1010 Upper abdominal pain, unspecified: Secondary | ICD-10-CM

## 2020-03-05 DIAGNOSIS — D649 Anemia, unspecified: Secondary | ICD-10-CM

## 2020-03-05 DIAGNOSIS — R6889 Other general symptoms and signs: Secondary | ICD-10-CM | POA: Diagnosis not present

## 2020-03-05 MED ORDER — HYOSCYAMINE SULFATE 0.125 MG SL SUBL
0.1250 mg | SUBLINGUAL_TABLET | Freq: Four times a day (QID) | SUBLINGUAL | 1 refills | Status: DC | PRN
Start: 2020-03-05 — End: 2020-05-29

## 2020-03-05 MED ORDER — FDGARD 25-20.75 MG PO CAPS: ORAL_CAPSULE | ORAL | 0 refills | Status: DC

## 2020-03-05 NOTE — Patient Instructions (Addendum)
If you are age 64 or older, your body mass index should be between 23-30. Your Body mass index is 43.81 kg/m. If this is out of the aforementioned range listed, please consider follow up with your Primary Care Provider.  If you are age 67 or younger, your body mass index should be between 19-25. Your Body mass index is 43.81 kg/m. If this is out of the aformentioned range listed, please consider follow up with your Primary Care Provider.  Your provider has requested that you go to the basement level for lab work at [place], [place], [place]. Press "B" on the elevator. The lab is located at the first door on the left as you exit the elevator.  We have sent the following medications to your pharmacy for you to pick up at your convenience: Levsin  You have been given samples of FDgard.  Use as directed.  DISCONTINUE CARAFATE AND BENTYL.   You are being referred to Pain Management.  You will be contacted with an appointment.  It was a pleasure to see you today!  Vito Cirigliano, D.O.

## 2020-03-05 NOTE — Progress Notes (Signed)
P  Chief Complaint:    Epigastric pain, upper abdominal pain  GI History: Sherry [name] is a 64 y.o. female with a history of morbid obesity, CHF, HCV (s/p treatment, hypertension, narcotic abuse, CKD 3, right breast cancer, tobacco use, GERD, gastritis, initially seen in the GI clinic in 12/2018 for relation of abdominal pain.  Laparoscopic ventral hernia repair with mesh in 04/2018 at [facility] after presenting in ER with port site and umbilical hernia with incarcerated fat and small segment of transverse colon incarcerated in the defect.  Had an uncomplicated postop course (slight ileus) and had been doing well until 10/2018.  Has had extensive evaluation for abdominal pain including most recently: -12/25/2019, CT abdomen/pelvis: No acute findings.  Stable bilateral adrenal adenomas ; normal WBC, LAEs, lipase.  H/H stable at 10.5/33.8 -11/27/2019: H. pylori breath test negative -11/18/2019, CT abdomen/pelvis: No acute findings.  Bilateral adrenal adenomas unchanged  Endoscopic history: -EGD (01/2019): Mildly irregular Z-line (bx: reflux changes), moderate gastritis, GIM bxs negative for IM, normal duodenum -Esophagram (04/2018): Normal.  Patient could not swallow barium tablet - VCE (05/2017): No report available for review - EGD (02/2017): Only path available for review.  Normal duodenum, gastritis and intestinal metaplasia from pylorus, acute and chronic inflammation at GE junction c/w reflux esophagitis, without Barrett's - EGD (07/2016, [facility]): Mild gastritis, negative H. pylori -Colonoscopy (07/2016, [facility]): Normal per report, but 3 to 4 mm TA x2 in path report - Colonoscopy (09/2015, [facility]): Poor prep, diverticulosis.  Recommended repeat in 1 year -Colonoscopy (04/2012, [facility]): 7 mm TA  HPI:     Patient is a 64 y.o. female presenting to the Gastroenterology Clinic for follow-up.  Initially seen by me via telemedicine visit in 12/2018 (MEG/abdominal pain, GERD, nausea), with subsequent EGD in 01/2019,  and no follow-up since then.  Today, she states she continues to have MEG/upper abdominal pain. Pain had improved for a couple months, then recurring in the last few months. No radiation.  No n/v/f/c/d/c, hematochezia, melena. Pain can last hours. No improvement with carafate. Still taking Protonix BID.   Has modified diet- limited sodas, baked foods, no fried, etc. Pain not related to types of food or PO timing. Not related to certain activities, time of day, etc. Has lost 6# over last 2 months or so with dietary mods.   Was given Rx for Bentyl, but not covered by insurance- not started.   Was last seen by her PCM for similar issues 02/26/20.  Has had multiple ER visits for these same symptoms, with CT negative x2 in the last couple of months.  Continued on dicyclomine.  Was also prescribed hydrocodone on 12/13/2019 and #20 more on 02/26/2020.  Still taking high-dose PPI and Carafate, but states Carafate ineffective; stopping today.  Review of systems:     No chest pain, no SOB, no fevers, no urinary sx   Past Medical History:  Diagnosis Date  . Anxiety   . Bipolar and related disorder ([place])   . Blood transfusion without reported diagnosis   . Breast cancer ([place])    right lumpectemy and lymph node removal  . CHF (congestive heart failure) ([place])   . Chronic back pain   . Chronic kidney disease    "Stage IV" - states due to hypertension  . Depression   . GERD (gastroesophageal reflux disease)   . Hepatitis C   . Hypertension   . Left-sided weakness   . MRSA infection    of breast incision  .  Opioid dependence ([place])    Has been treated in [facility] in past  . Substance abuse ([place])   . TIA (transient ischemic attack) 2020   P/w BP >230/120 and neurologic symptoms, presumed TIA    Patient's surgical history, family medical history, social history, medications and allergies were all reviewed in Epic    Current Outpatient Medications  Medication Sig Dispense Refill  . amLODipine  (NORVASC) 10 MG tablet Take 1 tablet (10 mg total) by mouth daily. 90 tablet 1  . atorvastatin (LIPITOR) 40 MG tablet Take 1 tablet (40 mg total) by mouth daily at 6 PM. 30 tablet 0  . Cholecalciferol (VITAMIN D3) 50 MCG (2000 UT) capsule Take 2,000 Units by mouth daily.    Marland Kitchen dicyclomine (BENTYL) 10 MG capsule Take 1 capsule (10 mg total) by mouth 3 (three) times daily as needed for spasms. 30 capsule 1  . FEROSUL 325 (65 Fe) MG tablet TAKE 1 TABLET BY MOUTH DAILY WITH BREAKFAST 30 tablet 1  . FLUoxetine (PROZAC) 40 MG capsule Take 40 mg by mouth daily.     Marland Kitchen gabapentin (NEURONTIN) 800 MG tablet TAKE 1 TABLET(800 MG) BY MOUTH THREE TIMES DAILY 90 tablet 0  . hydrALAZINE (APRESOLINE) 25 MG tablet Take 1 tablet (25 mg total) by mouth 3 (three) times daily. 90 tablet 0  . HYDROcodone-acetaminophen (NORCO/VICODIN) 5-325 MG tablet Take 1 tablet by mouth every 6 (six) hours as needed for moderate pain. 20 tablet 0  . hydrOXYzine (VISTARIL) 25 MG capsule Take 25 mg by mouth 4 (four) times daily as needed for anxiety.    . INGREZZA 80 MG CAPS Take 80 mg by mouth daily.    . isosorbide mononitrate (IMDUR) 60 MG 24 hr tablet TAKE 1 TABLET BY MOUTH DAILY 30 tablet 0  . labetalol (NORMODYNE) 200 MG tablet Take 1 tablet (200 mg total) by mouth 2 (two) times daily. 60 tablet 0  . Multiple Vitamins-Minerals (ADULT GUMMY) CHEW Chew 1 tablet by mouth daily.    Marland Kitchen omeprazole (PRILOSEC) 20 MG capsule Take 20 mg by mouth daily.    . ondansetron (ZOFRAN) 4 MG tablet TAKE 1 TABLET BY MOUTH EVERY 8 HOURS AS NEEDED FOR NAUSEA OR VOMITING 30 tablet 6  . oxybutynin (DITROPAN-XL) 5 MG 24 hr tablet TAKE 1 TABLET(5 MG) BY MOUTH AT BEDTIME 90 tablet 0  . pantoprazole (PROTONIX) 20 MG tablet Take 1 tablet (20 mg total) by mouth 2 (two) times daily. 180 tablet 1  . sucralfate (CARAFATE) 1 g tablet Take 1 tablet (1 g total) by mouth 4 (four) times daily -  with meals and at bedtime. 90 tablet 0  . terazosin (HYTRIN) 2 MG capsule  TAKE 1 CAPSULE BY MOUTH EVERY NIGHT AT BEDTIME 30 capsule 1   No current facility-administered medications for this visit.    Physical Exam:     BP 140/74   Pulse 74   Ht 5\' 2"  (1.575 m)   Wt 239 lb 8 oz (108.6 kg)   BMI 43.81 kg/m   GENERAL:  Pleasant, obese female in NAD PSYCH: : Cooperative, normal affect EENT:  conjunctiva pink, mucous membranes moist, neck supple without masses CARDIAC:  RRR, no murmur heard, no peripheral edema PULM: Normal respiratory effort, lungs CTA bilaterally, no wheezing ABDOMEN: Mild tenderness to light touch in upper abdomen.  No rebound or guarding.  No peritoneal signs.  Nondistended, soft. No obvious masses, no hepatomegaly,  normal bowel sounds SKIN:  turgor, no lesions seen Musculoskeletal:  Normal muscle tone, normal strength NEURO: Alert and oriented x 3, no focal neurologic deficits   IMPRESSION and PLAN:    1) Upper Abdominal Pain 64 year old female with long history of upper abdominal pain, with extensive work-up in the past as outlined above, to include endoscopic evaluation, cross-sectional imaging, labs, etc. laparoscopic surgery 2019 as well.  Discussed GI etiologies for ongoing pain, to include nonulcer dyspepsia, etc.  However, not entirely sure that her pain is GI or hepatobiliary in origin versus paresthesia or other neurologic etiology.  Plan for the following:  - Levsin trial.  If no improvement, can stop - FD Guard trial - Pain management referral - Did not trial TCA or SNRI given SSRI, but could consider changing in concert with prescribing physician for possible neuromodulation benefit -Symptoms do not seem vascular in origin, so did not order additional CTA -Stopped Carafate  2) Normocytic anemia 3) History of iron deficiency anemia  -H/H stable - Recheck iron panel  RTC as needed  I spent 30 minutes of time, including in depth chart review, independent review of results as outlined above, communicating results with  the patient directly, face-to-face time with the patient, coordinating care, and ordering studies and medications as appropriate, and documentation.       [place] ,DO, FACG 03/05/2020, 2:25 PM

## 2020-03-06 ENCOUNTER — Telehealth: Payer: Self-pay | Admitting: Family Medicine

## 2020-03-06 NOTE — Telephone Encounter (Signed)
I spoke with patient to explain that Dr Bryan Lemma started her on a trial of FDgard for dyspepsia, and stopped her Carafate and Bentyl.  I told her that Levsin was sent to her pharmacy to replace Bentyl.  She was concerned about taking Pantoprazole and FDgard and were these meds the same? I told her that Dr Bryan Lemma did not stop her Pantoprazole so therefore she needed to continue it and try the FDgard to see if it helps with her symptoms.  Patient stated that we were talking over each other and ended the phone call.

## 2020-03-06 NOTE — Telephone Encounter (Signed)
Pt will need an appointment. Pt could have a virtual appointment if she would prefer not to come in.

## 2020-03-06 NOTE — Telephone Encounter (Signed)
Pt is wanting a second opinion concerning her stomach pain. I stated she should make an appointment, she wanted to check and see if [name] would just call her. He(Cirigliano, Vito) put her on two new meds [place] Oil-Levomenthol (FDGARD) 25-20.75 MG CAPS [[id number]] and hyoscyamine (LEVSIN SL) 0.125 MG SL tablet [[id number]] and she is not sure if this is the right path.  Please advise at [phone].

## 2020-03-12 ENCOUNTER — Other Ambulatory Visit: Payer: Self-pay | Admitting: Family Medicine

## 2020-03-12 DIAGNOSIS — R1013 Epigastric pain: Secondary | ICD-10-CM

## 2020-03-16 ENCOUNTER — Other Ambulatory Visit: Payer: Self-pay | Admitting: Family Medicine

## 2020-03-16 NOTE — Telephone Encounter (Signed)
Requested Prescriptions  Pending Prescriptions Disp Refills  . oxybutynin (DITROPAN-XL) 5 MG 24 hr tablet [Pharmacy Med Name: OXYBUTYNIN ER 5MG  TABLETS] 90 tablet 0    Sig: TAKE 1 TABLET(5 MG) BY MOUTH AT BEDTIME     Urology:  Bladder Agents Passed - 03/16/2020 12:12 PM      Passed - Valid encounter within last 12 months    Recent Outpatient Visits          2 weeks ago Morbid obesity [facility])   [facility] at [facility], Ranell Patrick, MD   1 month ago Abdominal pain, epigastric   [facility] at [facility], Ranell Patrick, MD   3 months ago Abdominal pain, epigastric   [facility] at [facility], Ranell Patrick, MD   3 months ago Abdominal pain, epigastric   [facility] at [facility], Ranell Patrick, MD   5 months ago    Primary Care at [facility], Arlie Solomons, MD      Future Appointments            In 1 week [name] Ranell Patrick, MD Primary Care at [place], [facility]   In 2 months Wendie Agreste, MD Primary Care at [place], [facility]

## 2020-03-25 ENCOUNTER — Ambulatory Visit: Payer: Medicare HMO | Admitting: Family Medicine

## 2020-03-25 DIAGNOSIS — R6889 Other general symptoms and signs: Secondary | ICD-10-CM | POA: Diagnosis not present

## 2020-03-28 ENCOUNTER — Ambulatory Visit: Payer: Medicare HMO | Admitting: Family Medicine

## 2020-03-30 ENCOUNTER — Other Ambulatory Visit: Payer: Self-pay

## 2020-03-30 ENCOUNTER — Emergency Department (HOSPITAL_BASED_OUTPATIENT_CLINIC_OR_DEPARTMENT_OTHER)
Admission: EM | Admit: 2020-03-30 | Discharge: 2020-03-31 | Disposition: A | Discharge status: Home / Self Care | Payer: Medicare HMO | Attending: Emergency Medicine | Admitting: Emergency Medicine

## 2020-03-30 ENCOUNTER — Emergency Department (HOSPITAL_BASED_OUTPATIENT_CLINIC_OR_DEPARTMENT_OTHER): Payer: Medicare HMO

## 2020-03-30 ENCOUNTER — Encounter (HOSPITAL_BASED_OUTPATIENT_CLINIC_OR_DEPARTMENT_OTHER): Payer: Self-pay | Admitting: Emergency Medicine

## 2020-03-30 DIAGNOSIS — I517 Cardiomegaly: Secondary | ICD-10-CM | POA: Diagnosis not present

## 2020-03-30 DIAGNOSIS — N184 Chronic kidney disease, stage 4 (severe): Secondary | ICD-10-CM | POA: Diagnosis not present

## 2020-03-30 DIAGNOSIS — Z79899 Other long term (current) drug therapy: Secondary | ICD-10-CM | POA: Insufficient documentation

## 2020-03-30 DIAGNOSIS — Z853 Personal history of malignant neoplasm of breast: Secondary | ICD-10-CM | POA: Insufficient documentation

## 2020-03-30 DIAGNOSIS — Z8673 Personal history of transient ischemic attack (TIA), and cerebral infarction without residual deficits: Secondary | ICD-10-CM | POA: Diagnosis not present

## 2020-03-30 DIAGNOSIS — F17210 Nicotine dependence, cigarettes, uncomplicated: Secondary | ICD-10-CM | POA: Insufficient documentation

## 2020-03-30 DIAGNOSIS — R110 Nausea: Secondary | ICD-10-CM | POA: Diagnosis not present

## 2020-03-30 DIAGNOSIS — I130 Hypertensive heart and chronic kidney disease with heart failure and stage 1 through stage 4 chronic kidney disease, or unspecified chronic kidney disease: Secondary | ICD-10-CM | POA: Insufficient documentation

## 2020-03-30 DIAGNOSIS — I700 Atherosclerosis of aorta: Secondary | ICD-10-CM | POA: Diagnosis not present

## 2020-03-30 DIAGNOSIS — Z90710 Acquired absence of both cervix and uterus: Secondary | ICD-10-CM | POA: Diagnosis not present

## 2020-03-30 DIAGNOSIS — M549 Dorsalgia, unspecified: Secondary | ICD-10-CM | POA: Insufficient documentation

## 2020-03-30 DIAGNOSIS — K5730 Diverticulosis of large intestine without perforation or abscess without bleeding: Secondary | ICD-10-CM | POA: Diagnosis not present

## 2020-03-30 DIAGNOSIS — I5032 Chronic diastolic (congestive) heart failure: Secondary | ICD-10-CM | POA: Diagnosis not present

## 2020-03-30 DIAGNOSIS — R1013 Epigastric pain: Secondary | ICD-10-CM

## 2020-03-30 DIAGNOSIS — R112 Nausea with vomiting, unspecified: Secondary | ICD-10-CM | POA: Insufficient documentation

## 2020-03-30 DIAGNOSIS — R52 Pain, unspecified: Secondary | ICD-10-CM | POA: Diagnosis not present

## 2020-03-30 DIAGNOSIS — R1084 Generalized abdominal pain: Secondary | ICD-10-CM | POA: Diagnosis not present

## 2020-03-30 DIAGNOSIS — I10 Essential (primary) hypertension: Secondary | ICD-10-CM | POA: Diagnosis not present

## 2020-03-30 DIAGNOSIS — Z9049 Acquired absence of other specified parts of digestive tract: Secondary | ICD-10-CM | POA: Diagnosis not present

## 2020-03-30 DIAGNOSIS — R109 Unspecified abdominal pain: Secondary | ICD-10-CM | POA: Diagnosis not present

## 2020-03-30 LAB — COMPREHENSIVE METABOLIC PANEL
ALT: 11 U/L (ref 0–44)
AST: 14 U/L — ABNORMAL LOW (ref 15–41)
Albumin: 3.4 g/dL — ABNORMAL LOW (ref 3.5–5.0)
Alkaline Phosphatase: 108 U/L (ref 38–126)
Anion gap: 10 (ref 5–15)
BUN: 30 mg/dL — ABNORMAL HIGH (ref 8–23)
CO2: 21 mmol/L — ABNORMAL LOW (ref 22–32)
Calcium: 8.6 mg/dL — ABNORMAL LOW (ref 8.9–10.3)
Chloride: 111 mmol/L (ref 98–111)
Creatinine, Ser: 2.69 mg/dL — ABNORMAL HIGH (ref 0.44–1.00)
GFR calc Af Amer: 21 mL/min — ABNORMAL LOW (ref >60)
GFR calc non Af Amer: 18 mL/min — ABNORMAL LOW (ref >60)
Glucose, Bld: 103 mg/dL — ABNORMAL HIGH (ref 70–99)
Potassium: 4.6 mmol/L (ref 3.5–5.1)
Sodium: 142 mmol/L (ref 135–145)
Total Bilirubin: 0.2 mg/dL — ABNORMAL LOW (ref 0.3–1.2)
Total Protein: 6.9 g/dL (ref 6.5–8.1)

## 2020-03-30 LAB — CBC WITH DIFFERENTIAL/PLATELET
Abs Immature Granulocytes: 0.02 10*3/uL (ref 0.00–0.07)
Basophils Absolute: 0 10*3/uL (ref 0.0–0.1)
Basophils Relative: 1 %
Eosinophils Absolute: 0.2 10*3/uL (ref 0.0–0.5)
Eosinophils Relative: 3 %
HCT: 31.7 % — ABNORMAL LOW (ref 36.0–46.0)
Hemoglobin: 10 g/dL — ABNORMAL LOW (ref 12.0–15.0)
Immature Granulocytes: 0 %
Lymphocytes Relative: 27 %
Lymphs Abs: 1.8 10*3/uL (ref 0.7–4.0)
MCH: 27 pg (ref 26.0–34.0)
MCHC: 31.5 g/dL (ref 30.0–36.0)
MCV: 85.4 fL (ref 80.0–100.0)
Monocytes Absolute: 0.4 10*3/uL (ref 0.1–1.0)
Monocytes Relative: 6 %
Neutro Abs: 4.2 10*3/uL (ref 1.7–7.7)
Neutrophils Relative %: 63 %
Platelets: 257 10*3/uL (ref 150–400)
RBC: 3.71 MIL/uL — ABNORMAL LOW (ref 3.87–5.11)
RDW: 15.9 % — ABNORMAL HIGH (ref 11.5–15.5)
WBC: 6.6 10*3/uL (ref 4.0–10.5)
nRBC: 0 % (ref 0.0–0.2)

## 2020-03-30 LAB — RAPID URINE DRUG SCREEN, HOSP PERFORMED
Amphetamines: NOT DETECTED
Barbiturates: NOT DETECTED
Benzodiazepines: NOT DETECTED
Cocaine: NOT DETECTED
Opiates: NOT DETECTED
Tetrahydrocannabinol: NOT DETECTED

## 2020-03-30 LAB — LIPASE, BLOOD: Lipase: 63 U/L — ABNORMAL HIGH (ref 11–51)

## 2020-03-30 LAB — URINALYSIS, ROUTINE W REFLEX MICROSCOPIC
Bilirubin Urine: NEGATIVE
Glucose, UA: NEGATIVE mg/dL
Hgb urine dipstick: NEGATIVE
Ketones, ur: NEGATIVE mg/dL
Leukocytes,Ua: NEGATIVE
Nitrite: NEGATIVE
Protein, ur: 100 mg/dL — AB
Specific Gravity, Urine: 1.025 (ref 1.005–1.030)
pH: 6 (ref 5.0–8.0)

## 2020-03-30 LAB — URINALYSIS, MICROSCOPIC (REFLEX)

## 2020-03-30 LAB — TROPONIN I (HIGH SENSITIVITY): Troponin I (High Sensitivity): 10 ng/L (ref <18)

## 2020-03-30 MED ORDER — HYDROMORPHONE HCL 1 MG/ML IJ SOLN
0.5000 mg | Freq: Once | INTRAMUSCULAR | Status: AC
  Administered 2020-03-30: 0.5 mg via INTRAVENOUS
  Filled 2020-03-30: qty 1

## 2020-03-30 MED ORDER — PANTOPRAZOLE SODIUM 40 MG IV SOLR
40.0000 mg | Freq: Once | INTRAVENOUS | Status: AC
  Administered 2020-03-30: 40 mg via INTRAVENOUS
  Filled 2020-03-30: qty 40

## 2020-03-30 MED ORDER — HYDROMORPHONE HCL 1 MG/ML IJ SOLN
1.0000 mg | Freq: Once | INTRAMUSCULAR | Status: AC
  Administered 2020-03-30: 1 mg via INTRAVENOUS
  Filled 2020-03-30: qty 1

## 2020-03-30 MED ORDER — HYDROMORPHONE HCL 1 MG/ML IJ SOLN
INTRAMUSCULAR | Status: DC
Start: 2020-03-30 — End: 2020-03-31
  Filled 2020-03-30: qty 1

## 2020-03-30 MED ORDER — SODIUM CHLORIDE 0.9 % IV BOLUS
500.0000 mL | Freq: Once | INTRAVENOUS | Status: AC
  Administered 2020-03-30: 500 mL via INTRAVENOUS

## 2020-03-30 NOTE — Discharge Instructions (Signed)

## 2020-03-30 NOTE — ED Notes (Signed)
Pt to Xray.

## 2020-03-30 NOTE — ED Triage Notes (Addendum)
Pt arrives EMS, endorses epigastric pain and lower back pain x 3 days, N/V that started today. Pt endorses shob, however report r/t pain. Pt denies CP, is vomiting while being triaged

## 2020-03-30 NOTE — ED Notes (Signed)
Per EDP Long, second troponin level is discontinued

## 2020-03-30 NOTE — ED Notes (Signed)
Attempted IV X2 unable to obtain

## 2020-03-30 NOTE — ED Notes (Signed)
Pt to CT

## 2020-03-30 NOTE — ED Provider Notes (Signed)
[facility] EMERGENCY DEPARTMENT Provider Note   CSN: [id number] Arrival date & time: 03/30/20  1800     History Chief Complaint  Patient presents with  . Abdominal Pain    Sherry [name] is a 64 y.o. female with a past medical history of CHF, CKD, hypertension, substance abuse, neuroleptic induced tardive dyskinesia, GERD, epigastric abdominal pain who presents today for evaluation of abdominal pain.  Reports that 3 days ago she started having pain in her upper abdomen.  She reports that this is different from her usual episodes and that her pain is all across her back instead of just being in the middle.  She reports that today she started vomiting.  She has vomited twice.  She has taken Zofran at home which has kept her from vomiting however she continues to feel nauseous.  She denies any chest pain or shortness of breath.  She is unable to identify any event that started her pain 3 days ago.  Her last bowel movement was today and was normal for her.  No diarrhea or constipation.  No dysuria, increased frequency or urgency.  She reports compliance with all of her medications including her antihypertensives.  HPI     Past Medical History:  Diagnosis Date  . Anxiety   . Bipolar and related disorder ([place])   . Blood transfusion without reported diagnosis   . Breast cancer ([place])    right lumpectemy and lymph node removal  . CHF (congestive heart failure) ([place])   . Chronic back pain   . Chronic kidney disease    "Stage IV" - states due to hypertension  . Depression   . GERD (gastroesophageal reflux disease)   . Hepatitis C   . Hypertension   . Left-sided weakness   . MRSA infection    of breast incision  . Opioid dependence ([place])    Has been treated in [facility] in past  . Substance abuse ([place])   . TIA (transient ischemic attack) 2020   P/w BP >230/120 and neurologic symptoms, presumed TIA    Patient Active Problem List   Diagnosis Date Noted  . Chronic  diastolic CHF (congestive heart failure) ([place]) 05/09/2019  . GERD (gastroesophageal reflux disease) 05/09/2019  . Alcohol abuse 05/09/2019  . Hypertension 05/08/2019  . Bilateral low back pain with bilateral sciatica 01/05/2019  . Cyst of ovary   . Pre-operative cardiovascular examination 06/13/2018  . Dizziness 02/24/2018  . Left-sided weakness 02/24/2018  . Cigarette smoker 12/31/2017  . DOE (dyspnea on exertion) 12/30/2017  . Phlebitis after infusion 10/28/2017  . Hypertensive emergency 10/21/2017  . Microcytic anemia 10/21/2017  . Medication intolerance 09/06/2017  . Neuroleptic-induced tardive dyskinesia 09/06/2017  . Cancer ([place]) 09/06/2017  . Chronic low back pain 09/06/2017  . Grief reaction with prolonged bereavement 09/06/2017  . Opioid use disorder, severe, dependence ([place]) 08/27/2017  . Alcohol use disorder, severe, dependence ([place]) 08/27/2017  . Substance induced mood disorder ([place]) 08/13/2017  . AKI (acute kidney injury) ([place])   . MDD (major depressive disorder), recurrent severe, without psychosis ([place])   . Alcohol withdrawal ([place]) 08/09/2017  . Essential hypertension 07/06/2017  . Chronic kidney disease, stage III (moderate) 11/17/2016  . Depression with anxiety 08/21/2016  . Hx of bipolar disorder 01/31/2016  . History of breast cancer 02/13/2013  . Morbid obesity due to excess calories ([place]) 02/13/2013  . Hepatitis C virus infection cured after antiviral drug therapy 12/17/2012    Past Surgical History:  Procedure Laterality Date  .  BACK SURGERY  1990  . BREAST SURGERY Right 2010   "breast cancer survivor" - states partial mastectomy and nodes  . COLONOSCOPY     [facility]  . ESOPHAGOGASTRODUODENOSCOPY     [facility]  . LAPAROSCOPIC CHOLECYSTECTOMY  2002  . LAPAROSCOPIC INCISIONAL / UMBILICAL / VENTRAL HERNIA REPAIR  04/13/2018   with BARD 15x 56CL mesh (supraumbilical)  . LAPAROSCOPIC LYSIS OF ADHESIONS  07/12/2018   Procedure:  LAPAROSCOPIC LYSIS OF ADHESIONS;  Surgeon: Isabel Caprice, MD;  Location: [facility] ORS;  Service: Gynecology;;  . MYOMECTOMY     x 2 prior to hysterectomy  . ROBOTIC ASSISTED BILATERAL SALPINGO OOPHERECTOMY Right 07/12/2018   Procedure: XI ROBOTIC ASSISTED RIGHT SALPINGO OOPHORECTOMY;  Surgeon: Isabel Caprice, MD;  Location: [facility] ORS;  Service: Gynecology;  Laterality: Right;  . TOTAL ABDOMINAL HYSTERECTOMY     fibroids   . UPPER GASTROINTESTINAL ENDOSCOPY       OB History    Gravida  2   Para  0   Term      Preterm      AB  0   Living        SAB      TAB      Ectopic      Multiple      Live Births              Family History  Problem Relation Age of Onset  . Heart attack Mother   . Breast cancer Mother 25  . Dementia Mother   . Cancer Father 20       died of bleeding from kidneys  . Colon cancer Neg Hx   . Esophageal cancer Neg Hx   . Stomach cancer Neg Hx   . Rectal cancer Neg Hx     Social History   Tobacco Use  . Smoking status: Current Every Day Smoker    Packs/day: 0.25    Types: Cigarettes  . Smokeless tobacco: Never Used  . Tobacco comment: 4 cigs per day 01/2019  Vaping Use  . Vaping Use: Former  . Start date: 06/27/2013  . Quit date: 06/14/2017  . Devices: Apple Cinnamon-0 mg  Substance Use Topics  . Alcohol use: No  . Drug use: Not Currently    Comment: h/o IVD use    Home Medications Prior to Admission medications   Medication Sig Start Date End Date Taking? Authorizing Provider  amLODipine (NORVASC) 10 MG tablet Take 1 tablet (10 mg total) by mouth daily. 09/13/19   Forrest Moron, MD  atorvastatin (LIPITOR) 40 MG tablet TAKE 1 TABLET (40 MG TOTAL) BY MOUTH DAILY AT 6 PM. 03/12/20   Wendie Agreste, MD  [place] Oil-Levomenthol (FDGARD) 25-20.75 MG CAPS Take as directed 03/05/20   Cirigliano, Vito V, DO  Cholecalciferol (VITAMIN D3) 50 MCG (2000 UT) capsule Take 2,000 Units by mouth daily.    [provider]  FEROSUL 325 (65  Fe) MG tablet TAKE 1 TABLET BY MOUTH DAILY WITH BREAKFAST 11/29/19   Wendie Agreste, MD  FLUoxetine (PROZAC) 40 MG capsule Take 40 mg by mouth daily.  11/09/18   [provider]  gabapentin (NEURONTIN) 800 MG tablet TAKE 1 TABLET(800 MG) BY MOUTH THREE TIMES DAILY 02/20/20   Wendie Agreste, MD  hydrALAZINE (APRESOLINE) 25 MG tablet Take 1 tablet (25 mg total) by mouth 3 (three) times daily. 05/11/19   [place], Suann Larry, MD  HYDROcodone-acetaminophen (NORCO/VICODIN) 5-325 MG tablet  Take 1 tablet by mouth every 6 (six) hours as needed for moderate pain. 02/26/20   Wendie Agreste, MD  hydrOXYzine (VISTARIL) 25 MG capsule Take 25 mg by mouth 4 (four) times daily as needed for anxiety. 12/26/18   [provider]  hyoscyamine (LEVSIN SL) 0.125 MG SL tablet Place 1 tablet (0.125 mg total) under the tongue every 6 (six) hours as needed (AS NEEDED FOR PAIN). 03/05/20   Cirigliano, Vito V, DO  INGREZZA 80 MG CAPS Take 80 mg by mouth daily. 10/15/17   [provider]  isosorbide mononitrate (IMDUR) 60 MG 24 hr tablet TAKE 1 TABLET BY MOUTH DAILY 03/12/20   Wendie Agreste, MD  labetalol (NORMODYNE) 200 MG tablet Take 1 tablet (200 mg total) by mouth 2 (two) times daily. 05/11/19   [place], Suann Larry, MD  Multiple Vitamins-Minerals (ADULT GUMMY) CHEW Chew 1 tablet by mouth daily. 07/21/17   [provider]  omeprazole (PRILOSEC) 20 MG capsule Take 20 mg by mouth daily.    [provider]  ondansetron (ZOFRAN) 4 MG tablet TAKE 1 TABLET BY MOUTH EVERY 8 HOURS AS NEEDED FOR NAUSEA OR VOMITING 01/12/20   Wendie Agreste, MD  oxybutynin (DITROPAN-XL) 5 MG 24 hr tablet TAKE 1 TABLET(5 MG) BY MOUTH AT BEDTIME 03/16/20   Wendie Agreste, MD  pantoprazole (PROTONIX) 20 MG tablet Take 1 tablet (20 mg total) by mouth 2 (two) times daily. 11/27/19   Wendie Agreste, MD  terazosin (HYTRIN) 2 MG capsule TAKE 1 CAPSULE BY MOUTH EVERY NIGHT AT BEDTIME 01/12/20   Wendie Agreste, MD    Allergies    Abilify [aripiprazole], Remeron [mirtazapine], Trazodone and nefazodone, Flexeril [cyclobenzaprine], and Amoxicillin  Review of Systems   Review of Systems  Constitutional: Negative for chills, fatigue and fever.  Eyes: Negative for visual disturbance.  Respiratory: Negative for cough, chest tightness and shortness of breath.   Cardiovascular: Negative for chest pain, palpitations and leg swelling.  Gastrointestinal: Positive for abdominal pain, nausea and vomiting. Negative for constipation and diarrhea.  Genitourinary: Negative for dysuria, frequency and urgency.  Musculoskeletal: Positive for back pain. Negative for neck pain.  Skin: Negative for color change and rash.  Neurological: Negative for weakness and headaches.  All other systems reviewed and are negative.   Physical Exam Updated Vital Signs BP (!) 194/111 (BP Location: Right Arm)   Pulse 81   Temp 99 F (37.2 C) (Oral)   Resp (!) 24   Ht 5\' 2"  (1.575 m)   Wt 102.1 kg   SpO2 100%   BMI 41.15 kg/m   Physical Exam Vitals and nursing note reviewed.  Constitutional:      Appearance: She is obese. She is not diaphoretic.     Comments: Appears uncomfortable  HENT:     Head: Normocephalic and atraumatic.  Eyes:     General: No scleral icterus.       Right eye: No discharge.        Left eye: No discharge.     Conjunctiva/sclera: Conjunctivae normal.  Cardiovascular:     Rate and Rhythm: Normal rate and regular rhythm.     Heart sounds: Normal heart sounds.  Pulmonary:     Effort: Pulmonary effort is normal. No respiratory distress.     Breath sounds: Normal breath sounds. No stridor.  Abdominal:     General: Bowel sounds are normal. There is no distension.     Palpations: Abdomen is soft.  Tenderness: There is abdominal tenderness (Generalized, worse in epigastric).     Comments: Exam limited by body habitus.  Musculoskeletal:        General: No deformity.     Cervical  back: Normal range of motion.     Comments: Tenderness diffusely across bilateral lower back.  No localized midline tenderness.  Skin:    General: Skin is warm and dry.  Neurological:     Mental Status: She is alert and oriented to person, place, and time.     Motor: No weakness or abnormal muscle tone.     Comments: Abnormal movements, primarily of mouth and face consistent with reported history of tardive dyskinesia.  Psychiatric:        Behavior: Behavior normal.     ED Results / Procedures / Treatments   Labs (all labs ordered are listed, but only abnormal results are displayed) Labs Reviewed  COMPREHENSIVE METABOLIC PANEL  CBC WITH DIFFERENTIAL/PLATELET  URINALYSIS, ROUTINE W REFLEX MICROSCOPIC  LIPASE, BLOOD  MAGNESIUM  RAPID URINE DRUG SCREEN, HOSP PERFORMED  TROPONIN I (HIGH SENSITIVITY)    EKG EKG Interpretation  Date/Time:  Saturday March 30 2020 18:16:52 EDT Ventricular Rate:  80 PR Interval:    QRS Duration: 89 QT Interval:  431 QTC Calculation: 498 R Axis:   62 Text Interpretation: Sinus rhythm LAE, consider biatrial enlargement Left ventricular hypertrophy Borderline prolonged QT interval No STEMI Confirmed by Nanda Quinton [phone]) on 03/30/2020 6:19:14 PM   Radiology No results found.  Procedures Procedures (including critical care time)  Medications Ordered in ED Medications  HYDROmorphone (DILAUDID) injection 0.5 mg (has no administration in time range)  pantoprazole (PROTONIX) injection 40 mg (has no administration in time range)  sodium chloride 0.9 % bolus 500 mL (has no administration in time range)    ED Course  I have reviewed the triage vital signs and the nursing notes.  Pertinent labs & imaging results that were available during my care of the patient were reviewed by me and considered in my medical decision making (see chart for details).    MDM Rules/Calculators/A&P                         Patient is a 64 year old woman who  presents today for evaluation of abdominal pain.  She has a history of epigastric abdominal pain, however reports that this is different as it radiates across her entire back rather than just in one area.  She reports 2 episodes of vomiting today.  She is vaccinated against Covid.  She, to me, denies any chest pain or shortness of breath.  No fevers.  On exam she appears uncomfortable.  She reports compliance with all of her medications including her antihypertensives and GI meds.  Chart review shows she has previously been seen for GI, most recently last month where they report "Discussed GI etiologies for ongoing pain, to include nonulcer dyspepsia, etc.  However, not entirely sure that her pain is GI or hepatobiliary in origin versus paresthesia or other neurologic etiology."  Plan to obtain labs.  EKG does show prolonged QTC, for now, as patient is just nauseous, will hold additional antiemetics.  Will give gentle fluid bolus, IV protonix, pain medications and acute abdomen with chest.  Plan to reassess after symptomatic treatment and labs to determine need for a CT scan.   At shift change care was transferred to Dr. Laverta Baltimore who will follow pending studies, re-evaulate and determine disposition.  Final Clinical Impression(s) / ED Diagnoses Final diagnoses:  Epigastric pain    Rx / DC Orders ED Discharge Orders    None       Ollen Gross 03/30/20 Standley Dakins, MD 04/01/20 [phone]

## 2020-03-30 NOTE — ED Notes (Signed)
Pt ambulatory with stand by assist to restroom.

## 2020-03-30 NOTE — ED Notes (Signed)
ED Provider at bedside. 

## 2020-04-04 ENCOUNTER — Ambulatory Visit (INDEPENDENT_AMBULATORY_CARE_PROVIDER_SITE_OTHER): Payer: Medicare HMO | Admitting: Family Medicine

## 2020-04-04 ENCOUNTER — Other Ambulatory Visit: Payer: Self-pay

## 2020-04-04 ENCOUNTER — Encounter: Payer: Self-pay | Admitting: Family Medicine

## 2020-04-04 VITALS — BP 158/76 | HR 82 | Temp 97.7°F | Resp 16 | Ht 62.0 in | Wt 236.4 lb

## 2020-04-04 DIAGNOSIS — N183 Chronic kidney disease, stage 3 unspecified: Secondary | ICD-10-CM

## 2020-04-04 DIAGNOSIS — G8929 Other chronic pain: Secondary | ICD-10-CM | POA: Diagnosis not present

## 2020-04-04 DIAGNOSIS — D631 Anemia in chronic kidney disease: Secondary | ICD-10-CM | POA: Diagnosis not present

## 2020-04-04 DIAGNOSIS — R1013 Epigastric pain: Secondary | ICD-10-CM | POA: Diagnosis not present

## 2020-04-04 DIAGNOSIS — E782 Mixed hyperlipidemia: Secondary | ICD-10-CM

## 2020-04-04 DIAGNOSIS — R6889 Other general symptoms and signs: Secondary | ICD-10-CM | POA: Diagnosis not present

## 2020-04-04 DIAGNOSIS — M545 Low back pain: Secondary | ICD-10-CM

## 2020-04-04 MED ORDER — TRAMADOL HCL 50 MG PO TABS: 50.0000 mg | ORAL_TABLET | Freq: Three times a day (TID) | ORAL | 0 refills | Status: AC | PRN

## 2020-04-04 MED ORDER — SUCRALFATE 1 GM/10ML PO SUSP: 1.0000 g | Freq: Three times a day (TID) | ORAL | 0 refills | Status: DC

## 2020-04-04 NOTE — Progress Notes (Signed)
Patient ID: Sherry [name], female    DOB: [birth year]  Age: 64 y.o. MRN: [id number]  Chief Complaint  Patient presents with  . Abdominal Pain    pt was seen in ER on Saturday for abdominal pain they did not know what the cause was pt got a shot and was told to follow up with us.pt has seen GI in last year at [facility] in [place], pt reports upper abdominal pt reports normal BM and no correlating reasons such as eating, pt reports continuous 1 month reports a throbbing like pain.   . Back Pain    pt reports lower back pain, pt reports this has been a long standing problem, reports this is worse than normal and has had no relief from her medications     Subjective:   Patient is here for a couple of chronic pain problems.  She has been having a lot of trouble with epigastric pain.  This been going on for a long time.  She had an EGD a year ago which did not give any answers.  She has been hurting badly this summer but especially this past few weeks.  She hurts generally across the epigastrium.  She has nausea but no vomiting.  She does not know of anything that relieves it that she can take.  She has been on a number of medications unsuccessfully.  She also complains of severe low back pain.  That is been going on also for several years.  She had some low back surgery many years ago.  She has had MRI which revealed some degenerative disease but no nerves encroachments.  She has had a lot of health problems and is on lipid medications but I cannot tell that this is being checked up on for a long time.  Not sure whether the medication is causing any symptoms.  She has been on anxiety and depression medications.  She has a history of having been on psychotropic medications in the past that gave her tardive dyskinesia for which she is taking the valbenazine.  She is on pantoprazole for her stomach.  She is on a couple of medicines for bladder.  She would like some pain pills for her back, had some Vicodin earlier  this summer.  I tried to explain that chronic pain medications are not the answer.  Current allergies, medications, problem list, past/family and social histories reviewed.  Objective:  BP (!) 158/76   Pulse 82   Temp 97.7 F (36.5 C) (Temporal)   Resp 16   Ht $R'5\' 2"'RW$  (1.575 m)   Wt 236 lb 6.4 oz (107.2 kg)   SpO2 98%   BMI 43.24 kg/m   Short stature obese lady in significant pain time grunting all the time.  Chest clear.  Heart regular without murmur.  Abdomen has normal bowel sounds.  Soft without masses but is very tender across the epigastrium.  Very tender across the lower lumbar spine and paraspinous muscles.  Reviewed her old EGD, CTs, labs, etc. Assessment & Plan:   Assessment: 1. Anemia in stage 3 chronic kidney disease, unspecified whether stage 3a or 3b CKD   2. Mixed hyperlipidemia   3. Abdominal pain, chronic, epigastric   4. Chronic midline low back pain without sciatica       Plan: I know nothing that can definitively help her.  I am giving her a few tramadol.  However primarily I am trying to take away the iron to see if that is irritating the  stomach and take away the statin to see if that is making her aching worse.  See instructions. Orders Placed This Encounter  Procedures  . CMP14+EGFR  . Iron and TIBC  . Ferritin  . Lipid panel  . Ambulatory referral to Gastroenterology    Referral Priority:   Routine    Referral Type:   Consultation    Referral Reason:   Specialty Services Required    Referred to Provider:   Carol Ada, MD    Requested Specialty:   Gastroenterology    Number of Visits Requested:   1    Meds ordered this encounter  Medications  . traMADol (ULTRAM) 50 MG tablet    Sig: Take 1 tablet (50 mg total) by mouth every 8 (eight) hours as needed for up to 5 days.    Dispense:  15 tablet    Refill:  0  . sucralfate (CARAFATE) 1 GM/10ML suspension    Sig: Take 10 mLs (1 g total) by mouth 4 (four) times daily -  with meals and at  bedtime.    Dispense:  420 mL    Refill:  0         Patient Instructions    Stop taking the iron which may be irritating your stomach  Stop taking the atorvastatin (cholesterol medicine) which may be making you to ache worse in your back and other muscles  Take Carafate (sucralfate) 1 dose 3 times daily as needed for stomach pain  Take tramadol 1 every 8 hours only when needed for severe low back pain.  You are probably mostly going to have to learn to live with the low back pain.  You need to work hard on trying to keep your weight down and stay as active as possible to keep the muscles of the low back stretched out.  I am making referral to a different gastroenterologist for another opinion.  I hope stopping the iron helps some of the stomach pain.  See Dr. [name] back as needed   If you have lab work done today you will be contacted with your lab results within the next 2 weeks.  If you have not heard from [place] then please contact us. The fastest way to get your results is to register for My Chart.   IF you received an x-ray today, you will receive an invoice from [facility]. Please contact [facility] at [phone] with questions or concerns regarding your invoice.   IF you received labwork today, you will receive an invoice from [place]. Please contact [facility] at [phone] with questions or concerns regarding your invoice.   Our billing staff will not be able to assist you with questions regarding bills from these companies.  You will be contacted with the lab results as soon as they are available. The fastest way to get your results is to activate your My Chart account. Instructions are located on the last page of this paperwork. If you have not heard from [place] regarding the results in 2 weeks, please contact this office.      I spent over 50 minutes with the patient and trying to help figure out what is going on.  Return if symptoms worsen or  fail to improve.   Ruben Reason, MD 04/04/2020

## 2020-04-04 NOTE — Patient Instructions (Addendum)
  Stop taking the iron which may be irritating your stomach  Stop taking the atorvastatin (cholesterol medicine) which may be making you to ache worse in your back and other muscles  Take Carafate (sucralfate) 1 dose 3 times daily as needed for stomach pain  Take tramadol 1 every 8 hours only when needed for severe low back pain.  You are probably mostly going to have to learn to live with the low back pain.  You need to work hard on trying to keep your weight down and stay as active as possible to keep the muscles of the low back stretched out.  I am making referral to a different gastroenterologist for another opinion.  I hope stopping the iron helps some of the stomach pain.  See Dr. [name] back as needed   If you have lab work done today you will be contacted with your lab results within the next 2 weeks.  If you have not heard from [place] then please contact us. The fastest way to get your results is to register for My Chart.   IF you received an x-ray today, you will receive an invoice from [facility] Radiology. Please contact [facility] Radiology at [phone] with questions or concerns regarding your invoice.   IF you received labwork today, you will receive an invoice from [place]. Please contact [facility] at [phone] with questions or concerns regarding your invoice.   Our billing staff will not be able to assist you with questions regarding bills from these companies.  You will be contacted with the lab results as soon as they are available. The fastest way to get your results is to activate your My Chart account. Instructions are located on the last page of this paperwork. If you have not heard from [place] regarding the results in 2 weeks, please contact this office.

## 2020-04-05 LAB — CMP14+EGFR
ALT: 8 IU/L (ref 0–32)
AST: 8 IU/L (ref 0–40)
Albumin/Globulin Ratio: 1.3 (ref 1.2–2.2)
Albumin: 4.1 g/dL (ref 3.8–4.8)
Alkaline Phosphatase: 155 IU/L — ABNORMAL HIGH (ref 44–121)
BUN/Creatinine Ratio: 13 (ref 12–28)
BUN: 41 mg/dL — ABNORMAL HIGH (ref 8–27)
Bilirubin Total: <0.2 mg/dL (ref 0.0–1.2)
CO2: 17 mmol/L — ABNORMAL LOW (ref 20–29)
Calcium: 9.4 mg/dL (ref 8.7–10.3)
Chloride: 108 mmol/L — ABNORMAL HIGH (ref 96–106)
Creatinine, Ser: 3.11 mg/dL (ref 0.57–1.00)
GFR calc Af Amer: 17 mL/min/{1.73_m2} — ABNORMAL LOW (ref >59)
GFR calc non Af Amer: 15 mL/min/{1.73_m2} — ABNORMAL LOW (ref >59)
Globulin, Total: 3.1 g/dL (ref 1.5–4.5)
Glucose: 94 mg/dL (ref 65–99)
Potassium: 5.1 mmol/L (ref 3.5–5.2)
Sodium: 140 mmol/L (ref 134–144)
Total Protein: 7.2 g/dL (ref 6.0–8.5)

## 2020-04-05 LAB — FERRITIN: Ferritin: 408 ng/mL — ABNORMAL HIGH (ref 15–150)

## 2020-04-05 LAB — IRON AND TIBC
Iron Saturation: 27 % (ref 15–55)
Iron: 59 ug/dL (ref 27–139)
Total Iron Binding Capacity: 220 ug/dL — ABNORMAL LOW (ref 250–450)
UIBC: 161 ug/dL (ref 118–369)

## 2020-04-05 LAB — LIPID PANEL
Chol/HDL Ratio: 5.3 ratio — ABNORMAL HIGH (ref 0.0–4.4)
Cholesterol, Total: 231 mg/dL — ABNORMAL HIGH (ref 100–199)
HDL: 44 mg/dL (ref >39)
LDL Chol Calc (NIH): 123 mg/dL — ABNORMAL HIGH (ref 0–99)
Triglycerides: 361 mg/dL — ABNORMAL HIGH (ref 0–149)
VLDL Cholesterol Cal: 64 mg/dL — ABNORMAL HIGH (ref 5–40)

## 2020-04-05 NOTE — Progress Notes (Signed)
My medical assistant attempted to call the patient today to let her know that this worsening of her kidney function needed to be followed up on by her urologist.  Make sure that she is making that follow-up.  Fenton Malling. Sherry Darner, MD

## 2020-04-08 ENCOUNTER — Encounter (HOSPITAL_BASED_OUTPATIENT_CLINIC_OR_DEPARTMENT_OTHER): Payer: Self-pay | Admitting: *Deleted

## 2020-04-08 ENCOUNTER — Emergency Department (HOSPITAL_BASED_OUTPATIENT_CLINIC_OR_DEPARTMENT_OTHER)
Admission: EM | Admit: 2020-04-08 | Discharge: 2020-04-08 | Disposition: A | Discharge status: Home / Self Care | Payer: Medicare HMO | Attending: Emergency Medicine | Admitting: Emergency Medicine

## 2020-04-08 ENCOUNTER — Other Ambulatory Visit: Payer: Self-pay

## 2020-04-08 DIAGNOSIS — M545 Low back pain: Secondary | ICD-10-CM | POA: Insufficient documentation

## 2020-04-08 DIAGNOSIS — I130 Hypertensive heart and chronic kidney disease with heart failure and stage 1 through stage 4 chronic kidney disease, or unspecified chronic kidney disease: Secondary | ICD-10-CM | POA: Diagnosis not present

## 2020-04-08 DIAGNOSIS — G8929 Other chronic pain: Secondary | ICD-10-CM | POA: Insufficient documentation

## 2020-04-08 DIAGNOSIS — R1084 Generalized abdominal pain: Secondary | ICD-10-CM | POA: Diagnosis not present

## 2020-04-08 DIAGNOSIS — I5032 Chronic diastolic (congestive) heart failure: Secondary | ICD-10-CM | POA: Diagnosis not present

## 2020-04-08 DIAGNOSIS — R000 Tachycardia, unspecified: Secondary | ICD-10-CM | POA: Diagnosis not present

## 2020-04-08 DIAGNOSIS — R1013 Epigastric pain: Secondary | ICD-10-CM | POA: Diagnosis not present

## 2020-04-08 DIAGNOSIS — Z853 Personal history of malignant neoplasm of breast: Secondary | ICD-10-CM | POA: Insufficient documentation

## 2020-04-08 DIAGNOSIS — R112 Nausea with vomiting, unspecified: Secondary | ICD-10-CM | POA: Diagnosis not present

## 2020-04-08 DIAGNOSIS — I129 Hypertensive chronic kidney disease with stage 1 through stage 4 chronic kidney disease, or unspecified chronic kidney disease: Secondary | ICD-10-CM | POA: Diagnosis not present

## 2020-04-08 DIAGNOSIS — N184 Chronic kidney disease, stage 4 (severe): Secondary | ICD-10-CM | POA: Insufficient documentation

## 2020-04-08 DIAGNOSIS — I959 Hypotension, unspecified: Secondary | ICD-10-CM | POA: Diagnosis not present

## 2020-04-08 DIAGNOSIS — F17210 Nicotine dependence, cigarettes, uncomplicated: Secondary | ICD-10-CM | POA: Diagnosis not present

## 2020-04-08 DIAGNOSIS — K219 Gastro-esophageal reflux disease without esophagitis: Secondary | ICD-10-CM | POA: Diagnosis not present

## 2020-04-08 DIAGNOSIS — N183 Chronic kidney disease, stage 3 unspecified: Secondary | ICD-10-CM | POA: Diagnosis not present

## 2020-04-08 DIAGNOSIS — N179 Acute kidney failure, unspecified: Secondary | ICD-10-CM | POA: Diagnosis not present

## 2020-04-08 NOTE — ED Notes (Signed)
Pt ambulatory to restroom without assistance 

## 2020-04-08 NOTE — Discharge Instructions (Signed)
You were seen in the emergency room today with your epigastric abdominal pain.  Please follow with your primary care doctor and gastroenterologist by phone today to set up follow-up appointments.  Please take your home medications for epigastric pain and other medicines including blood pressure medicines.  Return to the emergency department any fevers, chills, or other sudden, severe symptoms.

## 2020-04-08 NOTE — ED Triage Notes (Signed)
Abdominal pain with nausea and vomiting for 4-5 days. Denies fever.

## 2020-04-08 NOTE — ED Provider Notes (Signed)
Emergency Department Provider Note   I have reviewed the triage vital signs and the nursing notes.   HISTORY  Chief Complaint Abdominal Pain and Emesis   HPI Sherry [name] is a 64 y.o. female with chronic abdominal and lower back pain presents to the emergency department for approximately 4 to 5 days of abdominal pain and nausea.  She denies vomiting to me although this is mentioned in the triage note.  She tells me that her epigastric pain feels similar to her chronic pain as well as her lower back pain.  She states it is in no way new or different.  She is not having fevers or chills.  She has been evaluated by gastroenterology as well as her primary care doctor in the last year.  Her PCP saw her last week and perform labs after recent ED visit.  There was discussion at that time regarding pain management moving forward and documented hesitancy about using opiate medications with concern for chronic pain and rebound discomfort.  Patient denies diarrhea.  She is drinking fluids. No radiation of symptoms or modifying factors.    Past Medical History:  Diagnosis Date  . Anxiety   . Bipolar and related disorder ([place])   . Blood transfusion without reported diagnosis   . Breast cancer ([place])    right lumpectemy and lymph node removal  . CHF (congestive heart failure) ([place])   . Chronic back pain   . Chronic kidney disease    "Stage IV" - states due to hypertension  . Depression   . GERD (gastroesophageal reflux disease)   . Hepatitis C   . Hypertension   . Left-sided weakness   . MRSA infection    of breast incision  . Opioid dependence ([place])    Has been treated in [facility] in past  . Substance abuse ([place])   . TIA (transient ischemic attack) 2020   P/w BP >230/120 and neurologic symptoms, presumed TIA    Patient Active Problem List   Diagnosis Date Noted  . Chronic diastolic CHF (congestive heart failure) ([place]) 05/09/2019  . GERD (gastroesophageal reflux disease)  05/09/2019  . Alcohol abuse 05/09/2019  . Hypertension 05/08/2019  . Bilateral low back pain with bilateral sciatica 01/05/2019  . Cyst of ovary   . Pre-operative cardiovascular examination 06/13/2018  . Dizziness 02/24/2018  . Left-sided weakness 02/24/2018  . Cigarette smoker 12/31/2017  . DOE (dyspnea on exertion) 12/30/2017  . Phlebitis after infusion 10/28/2017  . Hypertensive emergency 10/21/2017  . Microcytic anemia 10/21/2017  . Medication intolerance 09/06/2017  . Neuroleptic-induced tardive dyskinesia 09/06/2017  . Cancer ([place]) 09/06/2017  . Chronic low back pain 09/06/2017  . Grief reaction with prolonged bereavement 09/06/2017  . Opioid use disorder, severe, dependence ([place]) 08/27/2017  . Alcohol use disorder, severe, dependence ([place]) 08/27/2017  . Substance induced mood disorder ([place]) 08/13/2017  . AKI (acute kidney injury) ([place])   . MDD (major depressive disorder), recurrent severe, without psychosis ([place])   . Alcohol withdrawal ([place]) 08/09/2017  . Essential hypertension 07/06/2017  . Chronic kidney disease, stage III (moderate) 11/17/2016  . Depression with anxiety 08/21/2016  . Hx of bipolar disorder 01/31/2016  . History of breast cancer 02/13/2013  . Morbid obesity due to excess calories ([place]) 02/13/2013  . Hepatitis C virus infection cured after antiviral drug therapy 12/17/2012    Past Surgical History:  Procedure Laterality Date  . BACK SURGERY  1990  . BREAST SURGERY Right 2010   "breast cancer survivor" -  states partial mastectomy and nodes  . COLONOSCOPY     [facility]  . ESOPHAGOGASTRODUODENOSCOPY     [facility]  . LAPAROSCOPIC CHOLECYSTECTOMY  2002  . LAPAROSCOPIC INCISIONAL / UMBILICAL / VENTRAL HERNIA REPAIR  04/13/2018   with BARD 15x 70WC mesh (supraumbilical)  . LAPAROSCOPIC LYSIS OF ADHESIONS  07/12/2018   Procedure: LAPAROSCOPIC LYSIS OF ADHESIONS;  Surgeon: Isabel Caprice, MD;  Location: [facility] ORS;  Service: Gynecology;;   . MYOMECTOMY     x 2 prior to hysterectomy  . ROBOTIC ASSISTED BILATERAL SALPINGO OOPHERECTOMY Right 07/12/2018   Procedure: XI ROBOTIC ASSISTED RIGHT SALPINGO OOPHORECTOMY;  Surgeon: Isabel Caprice, MD;  Location: [facility] ORS;  Service: Gynecology;  Laterality: Right;  . TOTAL ABDOMINAL HYSTERECTOMY     fibroids   . UPPER GASTROINTESTINAL ENDOSCOPY      Allergies Abilify [aripiprazole], Remeron [mirtazapine], Trazodone and nefazodone, Flexeril [cyclobenzaprine], and Amoxicillin  Family History  Problem Relation Age of Onset  . Heart attack Mother   . Breast cancer Mother 58  . Dementia Mother   . Cancer Father 78       died of bleeding from kidneys  . Colon cancer Neg Hx   . Esophageal cancer Neg Hx   . Stomach cancer Neg Hx   . Rectal cancer Neg Hx     Social History Social History   Tobacco Use  . Smoking status: Current Every Day Smoker    Packs/day: 0.25    Types: Cigarettes  . Smokeless tobacco: Never Used  . Tobacco comment: 4 cigs per day 01/2019  Vaping Use  . Vaping Use: Former  . Start date: 06/27/2013  . Quit date: 06/14/2017  . Devices: Apple Cinnamon-0 mg  Substance Use Topics  . Alcohol use: No  . Drug use: Not Currently    Comment: h/o IVD use    Review of Systems  Constitutional: No fever/chills Eyes: No visual changes. ENT: No sore throat. Cardiovascular: Denies chest pain. Respiratory: Denies shortness of breath. Gastrointestinal: Positive epigastric abdominal pain. Positive nausea, no vomiting.  No diarrhea.  No constipation. Genitourinary: Negative for dysuria. Musculoskeletal: Negative for back pain. Skin: Negative for rash. Neurological: Negative for headaches, focal weakness or numbness.  10-point ROS otherwise negative.  ____________________________________________   PHYSICAL EXAM:  VITAL SIGNS: ED Triage Vitals  Enc Vitals Group     BP 04/08/20 1018 (!) 192/129     Pulse Rate 04/08/20 1018 (!) 106     Resp 04/08/20 1018 16       Temp --      Temp Source 04/08/20 1018 Oral     SpO2 04/08/20 1018 93 %     Weight 04/08/20 1020 236 lb 6.4 oz (107.2 kg)     Height 04/08/20 1020 5\' 2"  (1.575 m)   Constitutional: Alert and oriented. Well appearing and in no acute distress. Eyes: Conjunctivae are normal.  Head: Atraumatic. Nose: No congestion/rhinnorhea. Mouth/Throat: Mucous membranes are moist.   Neck: No stridor.  Cardiovascular: Mild tachycardia. Good peripheral circulation. Grossly normal heart sounds.   Respiratory: Normal respiratory effort.  No retractions. Lungs CTAB. Gastrointestinal: Soft with mild diffuse tenderness. No rebound or guarding. No focal tenderness. No distention.  Musculoskeletal: No gross deformities of extremities. Neurologic:  Normal speech and language.  Skin:  Skin is warm, dry and intact. No rash noted.  ____________________________________________  RADIOLOGY  None  ____________________________________________   PROCEDURES  Procedure(s) performed:   Procedures  None  ____________________________________________   INITIAL  IMPRESSION / ASSESSMENT AND PLAN / ED COURSE  Pertinent labs & imaging results that were available during my care of the patient were reviewed by me and considered in my medical decision making (see chart for details).   Patient returns to the emergency department with exacerbation of her chronic epigastric and lower back pain.  Her exam is reassuring.  I reviewed her PCP note from 9/23 as well as her most recent ED visit.  She had lab work done at the 9/23 visit which was also reviewed by me here.  She had a CT scan as well as plain film during her last ED visit.  I agree with prior documentation that Vicodin or other opiate medications are not ideal and potentially harmful in the setting of chronic pain.  I offered the patient nonopiate alternatives to treat her pain in the emergency department but she tells me these have not worked for her in the past.   She still has a prescription for Bentyl and Zofran at home.  She is tolerating PO.  Her abdominal exam is reassuring.  I advised that she follow closely with her PCP and gastroenterology team regarding her chronic pain to discuss additional treatment options and/or referral to pain specialist.    ____________________________________________  FINAL CLINICAL IMPRESSION(S) / ED DIAGNOSES  Final diagnoses:  Epigastric pain    Note:  This document was prepared using Dragon voice recognition software and may include unintentional dictation errors.  Nanda Quinton, MD, [facility] Emergency Medicine    Annalea Alguire, [facility], MD 04/08/20 1101

## 2020-04-09 DIAGNOSIS — D631 Anemia in chronic kidney disease: Secondary | ICD-10-CM | POA: Diagnosis not present

## 2020-04-09 DIAGNOSIS — N179 Acute kidney failure, unspecified: Secondary | ICD-10-CM

## 2020-04-09 DIAGNOSIS — N170 Acute kidney failure with tubular necrosis: Secondary | ICD-10-CM | POA: Diagnosis not present

## 2020-04-09 DIAGNOSIS — G8929 Other chronic pain: Secondary | ICD-10-CM | POA: Diagnosis not present

## 2020-04-09 DIAGNOSIS — I9589 Other hypotension: Secondary | ICD-10-CM | POA: Diagnosis not present

## 2020-04-09 DIAGNOSIS — R000 Tachycardia, unspecified: Secondary | ICD-10-CM | POA: Diagnosis not present

## 2020-04-09 DIAGNOSIS — I509 Heart failure, unspecified: Secondary | ICD-10-CM | POA: Diagnosis not present

## 2020-04-09 DIAGNOSIS — N183 Chronic kidney disease, stage 3 unspecified: Secondary | ICD-10-CM | POA: Diagnosis not present

## 2020-04-09 DIAGNOSIS — I10 Essential (primary) hypertension: Secondary | ICD-10-CM | POA: Diagnosis not present

## 2020-04-09 DIAGNOSIS — R112 Nausea with vomiting, unspecified: Secondary | ICD-10-CM | POA: Diagnosis not present

## 2020-04-09 DIAGNOSIS — I129 Hypertensive chronic kidney disease with stage 1 through stage 4 chronic kidney disease, or unspecified chronic kidney disease: Secondary | ICD-10-CM | POA: Diagnosis not present

## 2020-04-09 DIAGNOSIS — K2950 Unspecified chronic gastritis without bleeding: Secondary | ICD-10-CM | POA: Diagnosis not present

## 2020-04-09 DIAGNOSIS — I160 Hypertensive urgency: Secondary | ICD-10-CM | POA: Diagnosis not present

## 2020-04-09 DIAGNOSIS — R404 Transient alteration of awareness: Secondary | ICD-10-CM | POA: Diagnosis not present

## 2020-04-09 DIAGNOSIS — T887XXA Unspecified adverse effect of drug or medicament, initial encounter: Secondary | ICD-10-CM | POA: Diagnosis not present

## 2020-04-09 DIAGNOSIS — N281 Cyst of kidney, acquired: Secondary | ICD-10-CM | POA: Diagnosis not present

## 2020-04-09 DIAGNOSIS — R110 Nausea: Secondary | ICD-10-CM

## 2020-04-09 DIAGNOSIS — I959 Hypotension, unspecified: Secondary | ICD-10-CM | POA: Diagnosis not present

## 2020-04-09 DIAGNOSIS — G9341 Metabolic encephalopathy: Secondary | ICD-10-CM | POA: Diagnosis not present

## 2020-04-09 DIAGNOSIS — T50904A Poisoning by unspecified drugs, medicaments and biological substances, undetermined, initial encounter: Secondary | ICD-10-CM | POA: Diagnosis not present

## 2020-04-09 DIAGNOSIS — I130 Hypertensive heart and chronic kidney disease with heart failure and stage 1 through stage 4 chronic kidney disease, or unspecified chronic kidney disease: Secondary | ICD-10-CM | POA: Diagnosis not present

## 2020-04-09 DIAGNOSIS — R410 Disorientation, unspecified: Secondary | ICD-10-CM | POA: Diagnosis not present

## 2020-04-09 DIAGNOSIS — R1084 Generalized abdominal pain: Secondary | ICD-10-CM | POA: Diagnosis not present

## 2020-04-09 DIAGNOSIS — R109 Unspecified abdominal pain: Secondary | ICD-10-CM | POA: Diagnosis not present

## 2020-04-09 DIAGNOSIS — E872 Acidosis: Secondary | ICD-10-CM | POA: Diagnosis not present

## 2020-04-09 DIAGNOSIS — R1013 Epigastric pain: Secondary | ICD-10-CM | POA: Diagnosis not present

## 2020-04-09 HISTORY — DX: Acute kidney failure, unspecified: N17.9

## 2020-04-09 HISTORY — DX: Nausea: R11.0

## 2020-04-10 DIAGNOSIS — N179 Acute kidney failure, unspecified: Secondary | ICD-10-CM | POA: Diagnosis not present

## 2020-04-10 DIAGNOSIS — R109 Unspecified abdominal pain: Secondary | ICD-10-CM | POA: Diagnosis not present

## 2020-04-10 DIAGNOSIS — I10 Essential (primary) hypertension: Secondary | ICD-10-CM | POA: Diagnosis not present

## 2020-04-10 DIAGNOSIS — I160 Hypertensive urgency: Secondary | ICD-10-CM | POA: Diagnosis not present

## 2020-04-10 DIAGNOSIS — D631 Anemia in chronic kidney disease: Secondary | ICD-10-CM | POA: Diagnosis not present

## 2020-04-10 DIAGNOSIS — I129 Hypertensive chronic kidney disease with stage 1 through stage 4 chronic kidney disease, or unspecified chronic kidney disease: Secondary | ICD-10-CM | POA: Diagnosis not present

## 2020-04-10 DIAGNOSIS — N183 Chronic kidney disease, stage 3 unspecified: Secondary | ICD-10-CM | POA: Diagnosis not present

## 2020-04-11 DIAGNOSIS — D631 Anemia in chronic kidney disease: Secondary | ICD-10-CM | POA: Diagnosis not present

## 2020-04-11 DIAGNOSIS — I129 Hypertensive chronic kidney disease with stage 1 through stage 4 chronic kidney disease, or unspecified chronic kidney disease: Secondary | ICD-10-CM | POA: Diagnosis not present

## 2020-04-11 DIAGNOSIS — N183 Chronic kidney disease, stage 3 unspecified: Secondary | ICD-10-CM | POA: Diagnosis not present

## 2020-04-11 DIAGNOSIS — I160 Hypertensive urgency: Secondary | ICD-10-CM | POA: Diagnosis not present

## 2020-04-11 DIAGNOSIS — R109 Unspecified abdominal pain: Secondary | ICD-10-CM | POA: Diagnosis not present

## 2020-04-11 DIAGNOSIS — I10 Essential (primary) hypertension: Secondary | ICD-10-CM | POA: Diagnosis not present

## 2020-04-11 DIAGNOSIS — N179 Acute kidney failure, unspecified: Secondary | ICD-10-CM | POA: Diagnosis not present

## 2020-04-12 DIAGNOSIS — N183 Chronic kidney disease, stage 3 unspecified: Secondary | ICD-10-CM | POA: Diagnosis not present

## 2020-04-12 DIAGNOSIS — I10 Essential (primary) hypertension: Secondary | ICD-10-CM | POA: Diagnosis not present

## 2020-04-12 DIAGNOSIS — R109 Unspecified abdominal pain: Secondary | ICD-10-CM | POA: Diagnosis not present

## 2020-04-12 DIAGNOSIS — N179 Acute kidney failure, unspecified: Secondary | ICD-10-CM | POA: Diagnosis not present

## 2020-04-12 DIAGNOSIS — I129 Hypertensive chronic kidney disease with stage 1 through stage 4 chronic kidney disease, or unspecified chronic kidney disease: Secondary | ICD-10-CM | POA: Diagnosis not present

## 2020-04-12 DIAGNOSIS — I160 Hypertensive urgency: Secondary | ICD-10-CM | POA: Diagnosis not present

## 2020-04-12 DIAGNOSIS — D631 Anemia in chronic kidney disease: Secondary | ICD-10-CM | POA: Diagnosis not present

## 2020-04-13 DIAGNOSIS — N179 Acute kidney failure, unspecified: Secondary | ICD-10-CM | POA: Diagnosis not present

## 2020-04-13 DIAGNOSIS — I160 Hypertensive urgency: Secondary | ICD-10-CM | POA: Diagnosis not present

## 2020-04-13 DIAGNOSIS — R109 Unspecified abdominal pain: Secondary | ICD-10-CM | POA: Diagnosis not present

## 2020-04-13 DIAGNOSIS — N183 Chronic kidney disease, stage 3 unspecified: Secondary | ICD-10-CM | POA: Diagnosis not present

## 2020-04-13 DIAGNOSIS — I129 Hypertensive chronic kidney disease with stage 1 through stage 4 chronic kidney disease, or unspecified chronic kidney disease: Secondary | ICD-10-CM | POA: Diagnosis not present

## 2020-04-14 DIAGNOSIS — I160 Hypertensive urgency: Secondary | ICD-10-CM | POA: Diagnosis not present

## 2020-04-14 DIAGNOSIS — N183 Chronic kidney disease, stage 3 unspecified: Secondary | ICD-10-CM | POA: Diagnosis not present

## 2020-04-14 DIAGNOSIS — I129 Hypertensive chronic kidney disease with stage 1 through stage 4 chronic kidney disease, or unspecified chronic kidney disease: Secondary | ICD-10-CM | POA: Diagnosis not present

## 2020-04-14 DIAGNOSIS — N179 Acute kidney failure, unspecified: Secondary | ICD-10-CM | POA: Diagnosis not present

## 2020-04-14 DIAGNOSIS — R109 Unspecified abdominal pain: Secondary | ICD-10-CM | POA: Diagnosis not present

## 2020-04-15 ENCOUNTER — Telehealth (HOSPITAL_COMMUNITY): Payer: Self-pay | Admitting: Psychiatry

## 2020-04-15 DIAGNOSIS — R109 Unspecified abdominal pain: Secondary | ICD-10-CM | POA: Diagnosis not present

## 2020-04-15 DIAGNOSIS — I160 Hypertensive urgency: Secondary | ICD-10-CM | POA: Diagnosis not present

## 2020-04-15 DIAGNOSIS — D631 Anemia in chronic kidney disease: Secondary | ICD-10-CM | POA: Diagnosis not present

## 2020-04-15 DIAGNOSIS — N183 Chronic kidney disease, stage 3 unspecified: Secondary | ICD-10-CM | POA: Diagnosis not present

## 2020-04-15 DIAGNOSIS — I129 Hypertensive chronic kidney disease with stage 1 through stage 4 chronic kidney disease, or unspecified chronic kidney disease: Secondary | ICD-10-CM | POA: Diagnosis not present

## 2020-04-15 DIAGNOSIS — I10 Essential (primary) hypertension: Secondary | ICD-10-CM | POA: Diagnosis not present

## 2020-04-15 DIAGNOSIS — N179 Acute kidney failure, unspecified: Secondary | ICD-10-CM | POA: Diagnosis not present

## 2020-04-15 NOTE — Telephone Encounter (Signed)
D:  [facility] called to refer pt to CD-IOP.  A:  Pt is scheduled to meet with [name], LCSW, LCAS on 04-19-20 @ 9 a.m.  Was instructed to arrive at 8:30 a.m, wearing a mask and to bring the insurance card.  Also, requested she fax the H&P and med list to case manager.

## 2020-04-16 DIAGNOSIS — I10 Essential (primary) hypertension: Secondary | ICD-10-CM | POA: Diagnosis not present

## 2020-04-16 DIAGNOSIS — N179 Acute kidney failure, unspecified: Secondary | ICD-10-CM | POA: Diagnosis not present

## 2020-04-16 DIAGNOSIS — D631 Anemia in chronic kidney disease: Secondary | ICD-10-CM | POA: Diagnosis not present

## 2020-04-16 DIAGNOSIS — I160 Hypertensive urgency: Secondary | ICD-10-CM | POA: Diagnosis not present

## 2020-04-16 DIAGNOSIS — R109 Unspecified abdominal pain: Secondary | ICD-10-CM | POA: Diagnosis not present

## 2020-04-16 DIAGNOSIS — I129 Hypertensive chronic kidney disease with stage 1 through stage 4 chronic kidney disease, or unspecified chronic kidney disease: Secondary | ICD-10-CM | POA: Diagnosis not present

## 2020-04-16 DIAGNOSIS — N183 Chronic kidney disease, stage 3 unspecified: Secondary | ICD-10-CM | POA: Diagnosis not present

## 2020-04-17 DIAGNOSIS — I10 Essential (primary) hypertension: Secondary | ICD-10-CM | POA: Diagnosis not present

## 2020-04-17 DIAGNOSIS — D631 Anemia in chronic kidney disease: Secondary | ICD-10-CM | POA: Diagnosis not present

## 2020-04-17 DIAGNOSIS — N183 Chronic kidney disease, stage 3 unspecified: Secondary | ICD-10-CM | POA: Diagnosis not present

## 2020-04-17 DIAGNOSIS — N179 Acute kidney failure, unspecified: Secondary | ICD-10-CM | POA: Diagnosis not present

## 2020-04-17 DIAGNOSIS — R110 Nausea: Secondary | ICD-10-CM | POA: Diagnosis not present

## 2020-04-17 DIAGNOSIS — R109 Unspecified abdominal pain: Secondary | ICD-10-CM | POA: Diagnosis not present

## 2020-04-17 DIAGNOSIS — G8929 Other chronic pain: Secondary | ICD-10-CM | POA: Diagnosis not present

## 2020-04-18 DIAGNOSIS — K2950 Unspecified chronic gastritis without bleeding: Secondary | ICD-10-CM | POA: Diagnosis not present

## 2020-04-18 DIAGNOSIS — R112 Nausea with vomiting, unspecified: Secondary | ICD-10-CM | POA: Diagnosis not present

## 2020-04-18 DIAGNOSIS — R110 Nausea: Secondary | ICD-10-CM | POA: Diagnosis not present

## 2020-04-18 DIAGNOSIS — G8929 Other chronic pain: Secondary | ICD-10-CM | POA: Diagnosis not present

## 2020-04-18 DIAGNOSIS — N179 Acute kidney failure, unspecified: Secondary | ICD-10-CM | POA: Diagnosis not present

## 2020-04-18 DIAGNOSIS — R1013 Epigastric pain: Secondary | ICD-10-CM | POA: Diagnosis not present

## 2020-04-18 DIAGNOSIS — I129 Hypertensive chronic kidney disease with stage 1 through stage 4 chronic kidney disease, or unspecified chronic kidney disease: Secondary | ICD-10-CM | POA: Diagnosis not present

## 2020-04-18 DIAGNOSIS — N184 Chronic kidney disease, stage 4 (severe): Secondary | ICD-10-CM

## 2020-04-18 DIAGNOSIS — R109 Unspecified abdominal pain: Secondary | ICD-10-CM | POA: Diagnosis not present

## 2020-04-18 DIAGNOSIS — I10 Essential (primary) hypertension: Secondary | ICD-10-CM | POA: Diagnosis not present

## 2020-04-18 DIAGNOSIS — N183 Chronic kidney disease, stage 3 unspecified: Secondary | ICD-10-CM | POA: Diagnosis not present

## 2020-04-18 DIAGNOSIS — D631 Anemia in chronic kidney disease: Secondary | ICD-10-CM | POA: Diagnosis not present

## 2020-04-18 HISTORY — PX: ESOPHAGOGASTRODUODENOSCOPY: SHX1529

## 2020-04-18 HISTORY — DX: Chronic kidney disease, stage 4 (severe): N18.4

## 2020-04-19 ENCOUNTER — Ambulatory Visit (HOSPITAL_COMMUNITY): Payer: Medicare HMO | Admitting: Licensed Clinical Social Worker

## 2020-04-23 ENCOUNTER — Telehealth: Payer: Self-pay | Admitting: *Deleted

## 2020-04-23 NOTE — Telephone Encounter (Signed)
No voicemail.  Schedule a hospital follow up

## 2020-04-25 ENCOUNTER — Other Ambulatory Visit: Payer: Self-pay

## 2020-04-25 ENCOUNTER — Ambulatory Visit (INDEPENDENT_AMBULATORY_CARE_PROVIDER_SITE_OTHER): Payer: Medicare HMO | Admitting: Licensed Clinical Social Worker

## 2020-04-25 DIAGNOSIS — F1410 Cocaine abuse, uncomplicated: Secondary | ICD-10-CM | POA: Diagnosis not present

## 2020-04-25 DIAGNOSIS — F1210 Cannabis abuse, uncomplicated: Secondary | ICD-10-CM

## 2020-04-25 DIAGNOSIS — R6889 Other general symptoms and signs: Secondary | ICD-10-CM | POA: Diagnosis not present

## 2020-04-25 DIAGNOSIS — F1120 Opioid dependence, uncomplicated: Secondary | ICD-10-CM

## 2020-04-25 DIAGNOSIS — F1010 Alcohol abuse, uncomplicated: Secondary | ICD-10-CM | POA: Diagnosis not present

## 2020-04-25 DIAGNOSIS — F1994 Other psychoactive substance use, unspecified with psychoactive substance-induced mood disorder: Secondary | ICD-10-CM

## 2020-04-25 DIAGNOSIS — F1320 Sedative, hypnotic or anxiolytic dependence, uncomplicated: Secondary | ICD-10-CM | POA: Diagnosis not present
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
## 2020-04-26 ENCOUNTER — Telehealth: Payer: Self-pay

## 2020-04-26 DIAGNOSIS — R1013 Epigastric pain: Secondary | ICD-10-CM

## 2020-04-26 DIAGNOSIS — R1010 Upper abdominal pain, unspecified: Secondary | ICD-10-CM

## 2020-04-26 NOTE — Telephone Encounter (Signed)
Internal referral sent to [facility] regarding epigastric pain and pain of the upper abdomen.
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
## 2020-05-06 DIAGNOSIS — R6889 Other general symptoms and signs: Secondary | ICD-10-CM | POA: Diagnosis not present

## 2020-05-08 ENCOUNTER — Ambulatory Visit (INDEPENDENT_AMBULATORY_CARE_PROVIDER_SITE_OTHER): Payer: Medicare HMO | Admitting: Family Medicine

## 2020-05-08 ENCOUNTER — Encounter: Payer: Self-pay | Admitting: Family Medicine

## 2020-05-08 ENCOUNTER — Other Ambulatory Visit: Payer: Self-pay

## 2020-05-08 VITALS — BP 143/88 | HR 85 | Temp 98.0°F | Ht 62.0 in | Wt 227.0 lb

## 2020-05-08 DIAGNOSIS — G8929 Other chronic pain: Secondary | ICD-10-CM | POA: Diagnosis not present

## 2020-05-08 DIAGNOSIS — Z23 Encounter for immunization: Secondary | ICD-10-CM

## 2020-05-08 DIAGNOSIS — R1013 Epigastric pain: Secondary | ICD-10-CM

## 2020-05-08 DIAGNOSIS — M545 Low back pain, unspecified: Secondary | ICD-10-CM | POA: Diagnosis not present

## 2020-05-08 DIAGNOSIS — I10 Essential (primary) hypertension: Secondary | ICD-10-CM | POA: Diagnosis not present

## 2020-05-08 DIAGNOSIS — N184 Chronic kidney disease, stage 4 (severe): Secondary | ICD-10-CM

## 2020-05-08 DIAGNOSIS — F1121 Opioid dependence, in remission: Secondary | ICD-10-CM

## 2020-05-08 DIAGNOSIS — R6889 Other general symptoms and signs: Secondary | ICD-10-CM | POA: Diagnosis not present

## 2020-05-08 NOTE — Progress Notes (Signed)
Subjective:  Patient ID: Sherry [name], female    DOB: Dec 17, 1955  Age: 64 y.o. MRN: [id number]  CC:  Chief Complaint  Patient presents with  . Hospitalization Follow-up    Pt went to the hospital for abdominal pain. PT states when she got there her BP was really high so they kept her. pt reports back pain and wonders if the provider will prescribe a tens unit for her.    HPI Sherry [name] presents for  Hospital follow-up Notes reviewed.  Requested sponsor on phone - recovering addict. Consent obtained.    ER visit at [facility] September 27.  Chronic abdominal and lower back pain.  4 to 5 days of abdominal pain and nausea at that time.  Has been evaluated by gastroenterology previously.  Discussion pain management previously and hesitancy with opiate medications due to chronic pain and rebound discomfort.  Nonopiate medications were offered in the ER but those were declined.  She did have a prescription for Bentyl and Zofran, and was tolerating p.o.  Reassuring abdominal exam at that time, discharged.  Admitted September 28th at [facility], after presentation to the ER with abdominal pain.  Admitted 9/28 through 10/7.  With acute on chronic renal failure, intractable nausea.  Labile blood pressure from 70/40 248/132.  Creatinine 5.3 with GFR of 8.   AKI on CKD ATN Creatinine 5.3 initially as above, improved with IV fluid, down to 2.6 but then increased again.  Nephrology was consulted, avoid nephrotoxins, increase intake recommended.  Renal ultrasound revealed renal cyst, medical renal disease.  Outpatient nephrology follow-up recommended with her nephrologist - [facility]. Unknown follow up appt with nephrology.  Last creatinine 3.52 on 04/18/20  Abdominal pain with nausea: Unclear etiology, recurrent admissions, evaluations for abdominal pain previously.  GI consult obtained.  Upper endoscopy negative for acute pathology, history of tardive dyskinesia  so unable to use Phenergan/Reglan.  Continued Zofran as needed, PPI was prescribed. Referral has been placed from [facility] gastroenterology [place] to physical medicine and rehab regarding epigastric pain. Phone note reviewed from October 14 from gastroenterology, Dr. Dorrene German. Stomach biopsy showing some mild inflammation but no infection.  Continued PPI recommended.  Align or other probiotics discussed.  Plan for follow-up if not improving. Local gastroenterologist Dr. Bryan Lemma, last visit August 24 noted.  Levsin trial, FD guard and pain management referral were discussed at that time.  Carafate discontinued. Still taking pantaprazole BID.Marland Kitchen No appt scheduled with Dr. Bryan Lemma at this time.  Still taking zofran as needed.  Stomach is doing better.  Has received call from pain management - has not scheduled appt yet.  Has lost weight - adjusting diet.   Wt Readings from Last 3 Encounters:  05/08/20 227 lb (103 kg)  04/08/20 236 lb 6.4 oz (107.2 kg)  04/04/20 236 lb 6.4 oz (107.2 kg)    Hypertension: Meds were adjusted, hydralazine, labetalol doses were increased.  Improved control. BP Readings from Last 3 Encounters:  05/08/20 (!) 143/88  04/08/20 (!) 161/110  04/04/20 (!) 158/76   Lab Results  Component Value Date   CREATININE 3.11 ([place]) 04/04/2020    Chronic back pain: Treated for chronic low back pain.  Pain management eval pending as above.  She is taking gabapentin 800 mg 3 times daily.  She reports today that she was abusing gabapentin - was taking more and more - taking 4-5 per day. Would run out early, then would try street drugs - vicodin, pills.  Can not take tramadol as that wakes up addiction. Stopped gabapentin for past month.  Can take tylenol - not taking at this time. On prozac 40mg  qd. No SI, controlling depression symptoms.  Has not tried acupuncture/alternative therapies.  Considering tens unit.   Lab Results  Component Value Date   ALT 8 04/04/2020    AST 8 04/04/2020   ALKPHOS 155 (H) 04/04/2020   BILITOT <0.2 04/04/2020   Wt Readings from Last 3 Encounters:  05/08/20 227 lb (103 kg)  04/08/20 236 lb 6.4 oz (107.2 kg)  04/04/20 236 lb 6.4 oz (107.2 kg)       History Patient Active Problem List   Diagnosis Date Noted  . Chronic diastolic CHF (congestive heart failure) ([place]) 05/09/2019  . GERD (gastroesophageal reflux disease) 05/09/2019  . Alcohol abuse 05/09/2019  . Hypertension 05/08/2019  . Bilateral low back pain with bilateral sciatica 01/05/2019  . Cyst of ovary   . Pre-operative cardiovascular examination 06/13/2018  . Dizziness 02/24/2018  . Left-sided weakness 02/24/2018  . Cigarette smoker 12/31/2017  . DOE (dyspnea on exertion) 12/30/2017  . Phlebitis after infusion 10/28/2017  . Hypertensive emergency 10/21/2017  . Microcytic anemia 10/21/2017  . Medication intolerance 09/06/2017  . Neuroleptic-induced tardive dyskinesia 09/06/2017  . Cancer ([place]) 09/06/2017  . Chronic low back pain 09/06/2017  . Grief reaction with prolonged bereavement 09/06/2017  . Opioid use disorder, severe, dependence ([place]) 08/27/2017  . Alcohol use disorder, severe, dependence ([place]) 08/27/2017  . Substance induced mood disorder ([place]) 08/13/2017  . AKI (acute kidney injury) ([place])   . MDD (major depressive disorder), recurrent severe, without psychosis ([place])   . Alcohol withdrawal ([place]) 08/09/2017  . Essential hypertension 07/06/2017  . Chronic kidney disease, stage III (moderate) 11/17/2016  . Depression with anxiety 08/21/2016  . Hx of bipolar disorder 01/31/2016  . History of breast cancer 02/13/2013  . Morbid obesity due to excess calories ([place]) 02/13/2013  . Hepatitis C virus infection cured after antiviral drug therapy 12/17/2012   Past Medical History:  Diagnosis Date  . Anxiety   . Bipolar and related disorder ([place])   . Blood transfusion without reported diagnosis   . Breast cancer ([place])    right lumpectemy and lymph  node removal  . CHF (congestive heart failure) ([place])   . Chronic back pain   . Chronic kidney disease    "Stage IV" - states due to hypertension  . Depression   . GERD (gastroesophageal reflux disease)   . Hepatitis C   . Hypertension   . Left-sided weakness   . MRSA infection    of breast incision  . Opioid dependence ([place])    Has been treated in [facility] in past  . Substance abuse ([place])   . TIA (transient ischemic attack) 2020   P/w BP >230/120 and neurologic symptoms, presumed TIA   Past Surgical History:  Procedure Laterality Date  . BACK SURGERY  1990  . BREAST SURGERY Right 2010   "breast cancer survivor" - states partial mastectomy and nodes  . COLONOSCOPY     [facility]  . ESOPHAGOGASTRODUODENOSCOPY     [facility]  . ESOPHAGOGASTRODUODENOSCOPY  04/18/2020   [place]  . LAPAROSCOPIC CHOLECYSTECTOMY  2002  . LAPAROSCOPIC INCISIONAL / UMBILICAL / VENTRAL HERNIA REPAIR  04/13/2018   with BARD 15x 62BW mesh (supraumbilical)  . LAPAROSCOPIC LYSIS OF ADHESIONS  07/12/2018   Procedure: LAPAROSCOPIC LYSIS OF ADHESIONS;  Surgeon: Isabel Caprice, MD;  Location: [facility];  Service: Gynecology;;  . MYOMECTOMY     x 2 prior to hysterectomy  . ROBOTIC ASSISTED BILATERAL SALPINGO OOPHERECTOMY Right 07/12/2018   Procedure: XI ROBOTIC ASSISTED RIGHT SALPINGO OOPHORECTOMY;  Surgeon: Isabel Caprice, MD;  Location: [facility] ORS;  Service: Gynecology;  Laterality: Right;  . TOTAL ABDOMINAL HYSTERECTOMY     fibroids   . UPPER GASTROINTESTINAL ENDOSCOPY     Allergies  Allergen Reactions  . Abilify [Aripiprazole] Other (See Comments)    Tardive dyskinesia Oral  . Remeron [Mirtazapine] Other (See Comments)    Wgt stimulation /gain, Dizziness, Patient says "can tolerate"  . Trazodone And Nefazodone Other (See Comments)    Nightmares/sleep diturbance  . Flexeril [Cyclobenzaprine] Other (See Comments)    Pt states Flexeril makes her feel depressed   . Amoxicillin  Diarrhea and Other (See Comments)    NOTE the patient has had PCN WITHOUT reaction Has patient had a PCN reaction causing immediate rash, facial/tongue/throat swelling, SOB or lightheadedness with hypotension: No Has patient had a PCN reaction causing severe rash involving mucus membranes or skin necrosis: No Has patient had a PCN reaction that required hospitalization: No Has patient had a PCN reaction occurring within the last 10 years: No If all of the above answers are "NO", then may proceed with Cephalosporin use.    Prior to Admission medications   Medication Sig Start Date End Date Taking? Authorizing Provider  amLODipine (NORVASC) 10 MG tablet Take 1 tablet (10 mg total) by mouth daily. 09/13/19  Yes Forrest Moron, MD  Cholecalciferol (VITAMIN D3) 50 MCG (2000 UT) capsule Take 2,000 Units by mouth daily.   Yes [provider]  FLUoxetine (PROZAC) 40 MG capsule Take 40 mg by mouth daily.  11/09/18  Yes [provider]  gabapentin (NEURONTIN) 800 MG tablet TAKE 1 TABLET(800 MG) BY MOUTH THREE TIMES DAILY 02/20/20  Yes Wendie Agreste, MD  hydrALAZINE (APRESOLINE) 25 MG tablet Take 1 tablet (25 mg total) by mouth 3 (three) times daily. 05/11/19  Yes Danford, Suann Larry, MD  INGREZZA 80 MG CAPS Take 80 mg by mouth daily. 10/15/17  Yes [provider]  isosorbide mononitrate (IMDUR) 60 MG 24 hr tablet TAKE 1 TABLET BY MOUTH DAILY 03/12/20  Yes Wendie Agreste, MD  labetalol (NORMODYNE) 200 MG tablet Take 1 tablet (200 mg total) by mouth 2 (two) times daily. 05/11/19  Yes Danford, Suann Larry, MD  pantoprazole (PROTONIX) 20 MG tablet Take 1 tablet (20 mg total) by mouth 2 (two) times daily. 11/27/19  Yes Wendie Agreste, MD  atorvastatin (LIPITOR) 40 MG tablet TAKE 1 TABLET (40 MG TOTAL) BY MOUTH DAILY AT 6 PM. Patient not taking: Reported on 05/08/2020 03/12/20   Wendie Agreste, MD  Caraway Oil-Levomenthol (FDGARD) 25-20.75 MG CAPS Take as directed Patient  not taking: Reported on 05/08/2020 03/05/20   Cirigliano, Vito V, DO  CHANTIX CONTINUING MONTH PAK 1 MG tablet Take 1 mg by mouth 2 (two) times daily. Patient not taking: Reported on 05/08/2020 01/01/20   [provider]  Cholecalciferol 25 MCG (1000 UT) tablet Take by mouth. Patient not taking: Reported on 05/08/2020 04/19/20 05/19/20  [provider]  dicyclomine (BENTYL) 10 MG capsule Take by mouth at bedtime. Patient not taking: Reported on 05/08/2020 03/16/20   [provider]  FEROSUL 325 (65 Fe) MG tablet TAKE 1 TABLET BY MOUTH DAILY WITH BREAKFAST Patient not taking: Reported on 05/08/2020 11/29/19   Wendie Agreste, MD  furosemide (LASIX) 20 MG tablet Take 20 mg by mouth daily. Patient not taking: Reported on 05/08/2020 03/12/20   [provider]  hydrOXYzine (VISTARIL) 25 MG capsule Take 25 mg by mouth 4 (four) times daily as needed for anxiety. Patient not taking: Reported on 05/08/2020 12/26/18   [provider]  hyoscyamine (LEVSIN SL) 0.125 MG SL tablet Place 1 tablet (0.125 mg total) under the tongue every 6 (six) hours as needed (AS NEEDED FOR PAIN). Patient not taking: Reported on 05/08/2020 03/05/20   Cirigliano, Dominic Pea, DO  Multiple Vitamins-Minerals (ADULT GUMMY) CHEW Chew 1 tablet by mouth daily. Patient not taking: Reported on 05/08/2020 07/21/17   [provider]  omeprazole (PRILOSEC) 20 MG capsule Take 20 mg by mouth daily. Patient not taking: Reported on 05/08/2020    [provider]  ondansetron (ZOFRAN) 4 MG tablet TAKE 1 TABLET BY MOUTH EVERY 8 HOURS AS NEEDED FOR NAUSEA OR VOMITING Patient not taking: Reported on 05/08/2020 01/12/20   Wendie Agreste, MD  oxybutynin (DITROPAN-XL) 5 MG 24 hr tablet TAKE 1 TABLET(5 MG) BY MOUTH AT BEDTIME Patient not taking: Reported on 05/08/2020 03/16/20   Wendie Agreste, MD  sucralfate (CARAFATE) 1 GM/10ML suspension Take 10 mLs (1 g total) by mouth 4 (four) times daily -   with meals and at bedtime. Patient not taking: Reported on 05/08/2020 04/04/20   Posey Boyer, MD  terazosin (HYTRIN) 2 MG capsule TAKE 1 CAPSULE BY MOUTH EVERY NIGHT AT BEDTIME Patient not taking: Reported on 05/08/2020 01/12/20   Wendie Agreste, MD   Social History   Socioeconomic History  . Marital status: Single    Spouse name: Not on file  . Number of children: 0  . Years of education: 70  . Highest education level: High school graduate  Occupational History  . Occupation: Disabled  Tobacco Use  . Smoking status: Current Every Day Smoker    Packs/day: 0.25    Types: Cigarettes  . Smokeless tobacco: Never Used  . Tobacco comment: 4 cigs per day 01/2019  Vaping Use  . Vaping Use: Former  . Start date: 06/27/2013  . Quit date: 06/14/2017  . Devices: Apple Cinnamon-0 mg  Substance and Sexual Activity  . Alcohol use: No  . Drug use: Not Currently    Comment: h/o IVD use  . Sexual activity: Not Currently  Other Topics Concern  . Not on file  Social History Narrative   Lives at home alone.   Right-handed.   Caffeine use: 4 cups coffee/soda   Social Determinants of Health   Financial Resource Strain:   . Difficulty of Paying Living Expenses: Not on file  Food Insecurity:   . Worried About Charity fundraiser in the Last Year: Not on file  . Ran Out of Food in the Last Year: Not on file  Transportation Needs:   . Lack of Transportation (Medical): Not on file  . Lack of Transportation (Non-Medical): Not on file  Physical Activity:   . Days of Exercise per Week: Not on file  . Minutes of Exercise per Session: Not on file  Stress:   . Feeling of Stress : Not on file  Social Connections:   . Frequency of Communication with Friends and Family: Not on file  . Frequency of Social Gatherings with Friends and Family: Not on file  . Attends Religious Services: Not on file  . Active Member of Clubs or Organizations: Not on file  . Attends  Club or Organization Meetings: Not  on file  . Marital Status: Not on file  Intimate Partner Violence:   . Fear of Current or Ex-Partner: Not on file  . Emotionally Abused: Not on file  . Physically Abused: Not on file  . Sexually Abused: Not on file    Review of Systems Per HPI.   Objective:   Vitals:   05/08/20 1108  BP: (!) 143/88  Pulse: 85  Temp: 98 F (36.7 C)  TempSrc: Temporal  SpO2: 99%  Weight: 227 lb (103 kg)  Height: 5\' 2"  (1.575 m)     Physical Exam Vitals reviewed.  Constitutional:      Appearance: She is well-developed.  HENT:     Head: Normocephalic and atraumatic.  Eyes:     Conjunctiva/sclera: Conjunctivae normal.     Pupils: Pupils are equal, round, and reactive to light.  Neck:     Vascular: No carotid bruit.  Cardiovascular:     Rate and Rhythm: Normal rate and regular rhythm.     Heart sounds: Normal heart sounds.  Pulmonary:     Effort: Pulmonary effort is normal.     Breath sounds: Normal breath sounds.  Abdominal:     Palpations: Abdomen is soft. There is no pulsatile mass.     Tenderness: There is abdominal tenderness (epigastric., no rebound. ).  Musculoskeletal:     Right lower leg: No edema.     Left lower leg: No edema.  Skin:    General: Skin is warm and dry.  Neurological:     Mental Status: She is alert and oriented to person, place, and time.  Psychiatric:        Behavior: Behavior normal.    48 minutes spent during visit, greater than 50% counseling and assimilation of information, chart review, and discussion of plan.    Assessment & Plan:  Sherry [name] is a 64 y.o. female . Abdominal pain, chronic, epigastric  -Overall improved, tolerating PPI.  Has follow-up planned with pain management, encouraged her to follow-up finalize scheduling of appointment.  No other med changes for now.  Need for vaccination - Plan: Flu Vaccine QUAD 36+ mos IM  CKD (chronic kidney disease) stage 4, GFR 15-29 ml/min (HCC) - Plan: Basic metabolic panel  -AKI on CKD with  recent hospitalization, will check a BMP today, ensure she has close follow-up with nephrology.  Essential hypertension  -Improved control, commended on weight loss.  Chronic midline low back pain without sciatica History of narcotic addiction (HCC)  -Off gabapentin now due to overuse.  Not taking tramadol.  Would avoid narcotics and gabapentin at this point if at all possible.  Plan on pain management follow-up as above.  Can try Tylenol for now, option of alternative therapy such as acupuncture were discussed.  TENS unit possible but can discuss details with pain management.  Would consider change from fluoxetine to Cymbalta but would need to be cautious with her history of nausea, as well as tardive dyskinesia.  -Recheck 3 weeks.  No orders of the defined types were placed in this encounter.  Patient Instructions       If you have lab work done today you will be contacted with your lab results within the next 2 weeks.  If you have not heard from [place] then please contact us. The fastest way to get your results is to register for My Chart.   IF you received an x-ray today, you will receive an invoice from [facility].  Please contact [facility] at [phone] with questions or concerns regarding your invoice.   IF you received labwork today, you will receive an invoice from [facility]. Please contact [facility] at [phone] with questions or concerns regarding your invoice.   Our billing staff will not be able to assist you with questions regarding bills from these companies.  You will be contacted with the lab results as soon as they are available. The fastest way to get your results is to activate your My Chart account. Instructions are located on the last page of this paperwork. If you have not heard from [place] regarding the results in 2 weeks, please contact this office.         Signed, Merri Ray, MD [facility]

## 2020-05-08 NOTE — Patient Instructions (Addendum)
  We will check into getting you an appointment with your kidney doctor. I will check labs today.  Call pain management to setup up appointment.  Tylenol can be tried for back pain, option of alternative therapy such as acupuncture may be an option. Could consider changing from prozac to cymbalta - can discuss further at follow up in few weeks. Ok to remain off gabapentin at this time.  Continue pantoprazole for now.  Return to the clinic or go to the nearest emergency room if any of your symptoms worsen or new symptoms occur.      If you have lab work done today you will be contacted with your lab results within the next 2 weeks.  If you have not heard from [place] then please contact us. The fastest way to get your results is to register for My Chart.   IF you received an x-ray today, you will receive an invoice from [facility]. Please contact [facility] at [phone] with questions or concerns regarding your invoice.   IF you received labwork today, you will receive an invoice from [facility]. Please contact [facility] at [phone] with questions or concerns regarding your invoice.   Our billing staff will not be able to assist you with questions regarding bills from these companies.  You will be contacted with the lab results as soon as they are available. The fastest way to get your results is to activate your My Chart account. Instructions are located on the last page of this paperwork. If you have not heard from [place] regarding the results in 2 weeks, please contact this office.

## 2020-05-09 LAB — BASIC METABOLIC PANEL
BUN/Creatinine Ratio: 8 — ABNORMAL LOW (ref 12–28)
BUN: 18 mg/dL (ref 8–27)
CO2: 21 mmol/L (ref 20–29)
Calcium: 9.2 mg/dL (ref 8.7–10.3)
Chloride: 108 mmol/L — ABNORMAL HIGH (ref 96–106)
Creatinine, Ser: 2.37 mg/dL — ABNORMAL HIGH (ref 0.57–1.00)
GFR calc Af Amer: 24 mL/min/{1.73_m2} — ABNORMAL LOW (ref >59)
GFR calc non Af Amer: 21 mL/min/{1.73_m2} — ABNORMAL LOW (ref >59)
Glucose: 86 mg/dL (ref 65–99)
Potassium: 5.6 mmol/L — ABNORMAL HIGH (ref 3.5–5.2)
Sodium: 140 mmol/L (ref 134–144)

## 2020-05-14 ENCOUNTER — Other Ambulatory Visit: Payer: Self-pay | Admitting: Family Medicine

## 2020-05-14 DIAGNOSIS — E875 Hyperkalemia: Secondary | ICD-10-CM

## 2020-05-14 NOTE — Progress Notes (Signed)
See lab note. Hyperkalemia, lab only visit.

## 2020-05-16 ENCOUNTER — Telehealth: Payer: Self-pay | Admitting: Family Medicine

## 2020-05-16 NOTE — Telephone Encounter (Signed)
Pt called and is wanted to have a nurse call her to see if she should still come in for her blood work on Monday 05/20/20. She stated she is taking a supplement of cmoss it is a natural erb that pt stated cleans her system out. Pt would like to know what she should do about this. Please advise.

## 2020-05-17 NOTE — Telephone Encounter (Signed)
LM pt should still have blood work done informed of this on message

## 2020-05-20 ENCOUNTER — Ambulatory Visit: Payer: Medicare HMO

## 2020-05-20 ENCOUNTER — Telehealth: Payer: Self-pay | Admitting: Family Medicine

## 2020-05-20 ENCOUNTER — Other Ambulatory Visit: Payer: Self-pay

## 2020-05-20 DIAGNOSIS — R6889 Other general symptoms and signs: Secondary | ICD-10-CM | POA: Diagnosis not present

## 2020-05-20 DIAGNOSIS — E875 Hyperkalemia: Secondary | ICD-10-CM

## 2020-05-20 NOTE — Telephone Encounter (Signed)
Pt called and stated she has forgotten the number to the pain management clinic and would like a nurse to call her with that number. Please advise.

## 2020-05-20 NOTE — Telephone Encounter (Signed)
Patient would like a text or call with Pain management number asap

## 2020-05-20 NOTE — Telephone Encounter (Signed)
Error

## 2020-05-20 NOTE — Telephone Encounter (Signed)
LVM for the information to [place]. Their number is [phone]

## 2020-05-21 DIAGNOSIS — D631 Anemia in chronic kidney disease: Secondary | ICD-10-CM | POA: Diagnosis not present

## 2020-05-21 DIAGNOSIS — I10 Essential (primary) hypertension: Secondary | ICD-10-CM | POA: Diagnosis not present

## 2020-05-21 DIAGNOSIS — N2581 Secondary hyperparathyroidism of renal origin: Secondary | ICD-10-CM | POA: Diagnosis not present

## 2020-05-21 DIAGNOSIS — N179 Acute kidney failure, unspecified: Secondary | ICD-10-CM | POA: Diagnosis not present

## 2020-05-21 DIAGNOSIS — K219 Gastro-esophageal reflux disease without esophagitis: Secondary | ICD-10-CM | POA: Diagnosis not present

## 2020-05-21 DIAGNOSIS — I129 Hypertensive chronic kidney disease with stage 1 through stage 4 chronic kidney disease, or unspecified chronic kidney disease: Secondary | ICD-10-CM | POA: Diagnosis not present

## 2020-05-21 DIAGNOSIS — N189 Chronic kidney disease, unspecified: Secondary | ICD-10-CM | POA: Diagnosis not present

## 2020-05-21 DIAGNOSIS — R6889 Other general symptoms and signs: Secondary | ICD-10-CM | POA: Diagnosis not present

## 2020-05-21 DIAGNOSIS — N184 Chronic kidney disease, stage 4 (severe): Secondary | ICD-10-CM | POA: Diagnosis not present

## 2020-05-21 LAB — BASIC METABOLIC PANEL
BUN/Creatinine Ratio: 10 — ABNORMAL LOW (ref 12–28)
BUN: 23 mg/dL (ref 8–27)
CO2: 19 mmol/L — ABNORMAL LOW (ref 20–29)
Calcium: 9.2 mg/dL (ref 8.7–10.3)
Chloride: 112 mmol/L — ABNORMAL HIGH (ref 96–106)
Creatinine, Ser: 2.35 mg/dL — ABNORMAL HIGH (ref 0.57–1.00)
GFR calc Af Amer: 24 mL/min/{1.73_m2} — ABNORMAL LOW (ref >59)
GFR calc non Af Amer: 21 mL/min/{1.73_m2} — ABNORMAL LOW (ref >59)
Glucose: 99 mg/dL (ref 65–99)
Potassium: 4.7 mmol/L (ref 3.5–5.2)
Sodium: 147 mmol/L — ABNORMAL HIGH (ref 134–144)

## 2020-05-22 ENCOUNTER — Other Ambulatory Visit: Payer: Self-pay

## 2020-05-22 ENCOUNTER — Ambulatory Visit (HOSPITAL_COMMUNITY): Payer: Medicare HMO | Admitting: Behavioral Health

## 2020-05-22 DIAGNOSIS — R6889 Other general symptoms and signs: Secondary | ICD-10-CM | POA: Diagnosis not present

## 2020-05-24 DIAGNOSIS — M542 Cervicalgia: Secondary | ICD-10-CM

## 2020-05-24 DIAGNOSIS — M47816 Spondylosis without myelopathy or radiculopathy, lumbar region: Secondary | ICD-10-CM

## 2020-05-24 DIAGNOSIS — M47812 Spondylosis without myelopathy or radiculopathy, cervical region: Secondary | ICD-10-CM

## 2020-05-24 DIAGNOSIS — R6889 Other general symptoms and signs: Secondary | ICD-10-CM | POA: Diagnosis not present

## 2020-05-24 DIAGNOSIS — R258 Other abnormal involuntary movements: Secondary | ICD-10-CM

## 2020-05-24 DIAGNOSIS — M5441 Lumbago with sciatica, right side: Secondary | ICD-10-CM | POA: Diagnosis not present

## 2020-05-24 DIAGNOSIS — G8929 Other chronic pain: Secondary | ICD-10-CM

## 2020-05-24 HISTORY — DX: Other abnormal involuntary movements: R25.8

## 2020-05-24 HISTORY — DX: Cervicalgia: M54.2

## 2020-05-24 HISTORY — DX: Spondylosis without myelopathy or radiculopathy, cervical region: M47.812

## 2020-05-24 HISTORY — DX: Other chronic pain: G89.29

## 2020-05-24 HISTORY — DX: Spondylosis without myelopathy or radiculopathy, lumbar region: M47.816

## 2020-05-27 ENCOUNTER — Ambulatory Visit (INDEPENDENT_AMBULATORY_CARE_PROVIDER_SITE_OTHER): Payer: Medicare HMO | Admitting: Behavioral Health

## 2020-05-27 ENCOUNTER — Other Ambulatory Visit: Payer: Self-pay

## 2020-05-27 DIAGNOSIS — F1120 Opioid dependence, uncomplicated: Secondary | ICD-10-CM

## 2020-05-27 DIAGNOSIS — R6889 Other general symptoms and signs: Secondary | ICD-10-CM | POA: Diagnosis not present

## 2020-05-27 NOTE — Group Note (Signed)
Group Topic: Relapse Prevention  Group Date: 05/27/2020 Start Time:  9:00 AM End Time: 12:00 PM Facilitators: Allyne Gee, Counselor  Department: [facility]  Number of Participants: 4  Group Focus: acceptance, clarity of thought, coping skills, healthy friendships, and self-awareness Treatment Modality:  Cognitive Behavioral Therapy Interventions utilized were clarification, confrontation, and exploration Purpose: enhance coping skills, explore maladaptive thinking, express feelings, and increase insight   Name: Sherry [name] Date of Birth: [birth year]  MR: [id number]    Level of Participation: active Quality of Participation: attentive and cooperative Interactions with others: gave feedback Mood/Affect: appropriate Triggers (if applicable): N/A  Cognition: coherent/clear Progress: Minimal Response: Today was [name]'s first day in group. She checked-in by sharing that she hopes to gain support and advice as a result of being apart of this group. She reported to the group that her drug of choice was opiates. [name] shared that she has developed a lot of knowledge about recovery. She reported "this time around I need to listen". [name] was receptive to support offered by her peers. Plan: follow-up needed  Patients Problems:  Patient Active Problem List   Diagnosis Date Noted  . Chronic diastolic CHF (congestive heart failure) ([place]) 05/09/2019  . GERD (gastroesophageal reflux disease) 05/09/2019  . Alcohol abuse 05/09/2019  . Hypertension 05/08/2019  . Bilateral low back pain with bilateral sciatica 01/05/2019  . Cyst of ovary   . Pre-operative cardiovascular examination 06/13/2018  . Dizziness 02/24/2018  . Left-sided weakness 02/24/2018  . Cigarette smoker 12/31/2017  . DOE (dyspnea on exertion) 12/30/2017  . Phlebitis after infusion 10/28/2017  . Hypertensive emergency 10/21/2017  . Microcytic anemia 10/21/2017  . Medication intolerance 09/06/2017   . Neuroleptic-induced tardive dyskinesia 09/06/2017  . Cancer ([place]) 09/06/2017  . Chronic low back pain 09/06/2017  . Grief reaction with prolonged bereavement 09/06/2017  . Opioid use disorder, severe, dependence ([place]) 08/27/2017  . Alcohol use disorder, severe, dependence ([place]) 08/27/2017  . Substance induced mood disorder ([place]) 08/13/2017  . AKI (acute kidney injury) ([place])   . MDD (major depressive disorder), recurrent severe, without psychosis ([place])   . Alcohol withdrawal ([place]) 08/09/2017  . Essential hypertension 07/06/2017  . Chronic kidney disease, stage III (moderate) 11/17/2016  . Depression with anxiety 08/21/2016  . Hx of bipolar disorder 01/31/2016  . History of breast cancer 02/13/2013  . Morbid obesity due to excess calories ([place]) 02/13/2013  . Hepatitis C virus infection cured after antiviral drug therapy 12/17/2012     Family Program: Family present? No   Name of family member(s): N/A   UDS collected: No Results: N/A  AA/NA attended?: No  Sponsor?: No

## 2020-05-28 ENCOUNTER — Encounter: Payer: Self-pay | Admitting: Radiology

## 2020-05-28 DIAGNOSIS — N184 Chronic kidney disease, stage 4 (severe): Secondary | ICD-10-CM | POA: Diagnosis not present

## 2020-05-28 DIAGNOSIS — I10 Essential (primary) hypertension: Secondary | ICD-10-CM | POA: Diagnosis not present

## 2020-05-28 DIAGNOSIS — R6889 Other general symptoms and signs: Secondary | ICD-10-CM | POA: Diagnosis not present

## 2020-05-29 ENCOUNTER — Ambulatory Visit (INDEPENDENT_AMBULATORY_CARE_PROVIDER_SITE_OTHER): Payer: Medicare HMO | Admitting: Family Medicine

## 2020-05-29 ENCOUNTER — Ambulatory Visit: Payer: Medicare HMO | Admitting: Family Medicine

## 2020-05-29 ENCOUNTER — Ambulatory Visit (HOSPITAL_COMMUNITY): Payer: Self-pay | Admitting: Behavioral Health

## 2020-05-29 ENCOUNTER — Emergency Department (HOSPITAL_COMMUNITY): Payer: Medicare HMO

## 2020-05-29 ENCOUNTER — Other Ambulatory Visit: Payer: Self-pay

## 2020-05-29 ENCOUNTER — Inpatient Hospital Stay (HOSPITAL_COMMUNITY)
Admission: EM | Admit: 2020-05-29 | Discharge: 2020-06-01 | DRG: 313 | Disposition: A | Discharge status: Home / Self Care | Payer: Medicare HMO | Attending: Internal Medicine | Admitting: Internal Medicine

## 2020-05-29 ENCOUNTER — Encounter: Payer: Self-pay | Admitting: Family Medicine

## 2020-05-29 VITALS — BP 104/69 | HR 62 | Temp 98.0°F | Ht 62.0 in

## 2020-05-29 DIAGNOSIS — M549 Dorsalgia, unspecified: Secondary | ICD-10-CM | POA: Diagnosis present

## 2020-05-29 DIAGNOSIS — D631 Anemia in chronic kidney disease: Secondary | ICD-10-CM

## 2020-05-29 DIAGNOSIS — R079 Chest pain, unspecified: Secondary | ICD-10-CM

## 2020-05-29 DIAGNOSIS — I517 Cardiomegaly: Secondary | ICD-10-CM | POA: Diagnosis not present

## 2020-05-29 DIAGNOSIS — Z8614 Personal history of Methicillin resistant Staphylococcus aureus infection: Secondary | ICD-10-CM

## 2020-05-29 DIAGNOSIS — G8929 Other chronic pain: Secondary | ICD-10-CM | POA: Diagnosis present

## 2020-05-29 DIAGNOSIS — M2548 Effusion, other site: Secondary | ICD-10-CM | POA: Diagnosis not present

## 2020-05-29 DIAGNOSIS — Z20822 Contact with and (suspected) exposure to covid-19: Secondary | ICD-10-CM | POA: Diagnosis not present

## 2020-05-29 DIAGNOSIS — R0789 Other chest pain: Secondary | ICD-10-CM | POA: Diagnosis not present

## 2020-05-29 DIAGNOSIS — R197 Diarrhea, unspecified: Secondary | ICD-10-CM | POA: Diagnosis not present

## 2020-05-29 DIAGNOSIS — Z881 Allergy status to other antibiotic agents status: Secondary | ICD-10-CM

## 2020-05-29 DIAGNOSIS — R55 Syncope and collapse: Secondary | ICD-10-CM

## 2020-05-29 DIAGNOSIS — F332 Major depressive disorder, recurrent severe without psychotic features: Secondary | ICD-10-CM | POA: Diagnosis not present

## 2020-05-29 DIAGNOSIS — F17210 Nicotine dependence, cigarettes, uncomplicated: Secondary | ICD-10-CM | POA: Diagnosis present

## 2020-05-29 DIAGNOSIS — Z803 Family history of malignant neoplasm of breast: Secondary | ICD-10-CM

## 2020-05-29 DIAGNOSIS — K219 Gastro-esophageal reflux disease without esophagitis: Secondary | ICD-10-CM | POA: Diagnosis present

## 2020-05-29 DIAGNOSIS — I10 Essential (primary) hypertension: Secondary | ICD-10-CM | POA: Diagnosis present

## 2020-05-29 DIAGNOSIS — Z6841 Body Mass Index (BMI) 40.0 and over, adult: Secondary | ICD-10-CM

## 2020-05-29 DIAGNOSIS — R110 Nausea: Secondary | ICD-10-CM

## 2020-05-29 DIAGNOSIS — H748X1 Other specified disorders of right middle ear and mastoid: Secondary | ICD-10-CM | POA: Diagnosis not present

## 2020-05-29 DIAGNOSIS — E872 Acidosis: Secondary | ICD-10-CM | POA: Diagnosis present

## 2020-05-29 DIAGNOSIS — N183 Chronic kidney disease, stage 3 unspecified: Secondary | ICD-10-CM | POA: Diagnosis present

## 2020-05-29 DIAGNOSIS — R457 State of emotional shock and stress, unspecified: Secondary | ICD-10-CM | POA: Diagnosis not present

## 2020-05-29 DIAGNOSIS — N1832 Chronic kidney disease, stage 3b: Secondary | ICD-10-CM | POA: Diagnosis not present

## 2020-05-29 DIAGNOSIS — F319 Bipolar disorder, unspecified: Secondary | ICD-10-CM | POA: Diagnosis not present

## 2020-05-29 DIAGNOSIS — Z9049 Acquired absence of other specified parts of digestive tract: Secondary | ICD-10-CM

## 2020-05-29 DIAGNOSIS — I5032 Chronic diastolic (congestive) heart failure: Secondary | ICD-10-CM | POA: Diagnosis not present

## 2020-05-29 DIAGNOSIS — F419 Anxiety disorder, unspecified: Secondary | ICD-10-CM | POA: Diagnosis present

## 2020-05-29 DIAGNOSIS — Z8249 Family history of ischemic heart disease and other diseases of the circulatory system: Secondary | ICD-10-CM

## 2020-05-29 DIAGNOSIS — N189 Chronic kidney disease, unspecified: Secondary | ICD-10-CM | POA: Diagnosis not present

## 2020-05-29 DIAGNOSIS — N179 Acute kidney failure, unspecified: Secondary | ICD-10-CM | POA: Diagnosis present

## 2020-05-29 DIAGNOSIS — Z8659 Personal history of other mental and behavioral disorders: Secondary | ICD-10-CM

## 2020-05-29 DIAGNOSIS — Z8673 Personal history of transient ischemic attack (TIA), and cerebral infarction without residual deficits: Secondary | ICD-10-CM | POA: Diagnosis not present

## 2020-05-29 DIAGNOSIS — Z888 Allergy status to other drugs, medicaments and biological substances status: Secondary | ICD-10-CM

## 2020-05-29 DIAGNOSIS — N184 Chronic kidney disease, stage 4 (severe): Secondary | ICD-10-CM | POA: Diagnosis not present

## 2020-05-29 DIAGNOSIS — K2970 Gastritis, unspecified, without bleeding: Secondary | ICD-10-CM

## 2020-05-29 DIAGNOSIS — I6782 Cerebral ischemia: Secondary | ICD-10-CM | POA: Diagnosis not present

## 2020-05-29 DIAGNOSIS — Z79899 Other long term (current) drug therapy: Secondary | ICD-10-CM

## 2020-05-29 DIAGNOSIS — R42 Dizziness and giddiness: Secondary | ICD-10-CM

## 2020-05-29 DIAGNOSIS — I6381 Other cerebral infarction due to occlusion or stenosis of small artery: Secondary | ICD-10-CM | POA: Diagnosis not present

## 2020-05-29 DIAGNOSIS — Z853 Personal history of malignant neoplasm of breast: Secondary | ICD-10-CM | POA: Diagnosis not present

## 2020-05-29 DIAGNOSIS — I130 Hypertensive heart and chronic kidney disease with heart failure and stage 1 through stage 4 chronic kidney disease, or unspecified chronic kidney disease: Secondary | ICD-10-CM | POA: Diagnosis present

## 2020-05-29 DIAGNOSIS — Z90710 Acquired absence of both cervix and uterus: Secondary | ICD-10-CM

## 2020-05-29 DIAGNOSIS — R1013 Epigastric pain: Secondary | ICD-10-CM

## 2020-05-29 HISTORY — DX: Chest pain, unspecified: R07.9

## 2020-05-29 HISTORY — DX: Dyspnea, unspecified: R06.00

## 2020-05-29 HISTORY — DX: Syncope and collapse: R55

## 2020-05-29 LAB — URINALYSIS, ROUTINE W REFLEX MICROSCOPIC
Bilirubin Urine: NEGATIVE
Glucose, UA: NEGATIVE mg/dL
Hgb urine dipstick: NEGATIVE
Ketones, ur: NEGATIVE mg/dL
Leukocytes,Ua: NEGATIVE
Nitrite: NEGATIVE
Protein, ur: 100 mg/dL — AB
Specific Gravity, Urine: 1.012 (ref 1.005–1.030)
pH: 5 (ref 5.0–8.0)

## 2020-05-29 LAB — CBC WITH DIFFERENTIAL/PLATELET
Abs Immature Granulocytes: 0.03 10*3/uL (ref 0.00–0.07)
Basophils Absolute: 0 10*3/uL (ref 0.0–0.1)
Basophils Relative: 0 %
Eosinophils Absolute: 0.1 10*3/uL (ref 0.0–0.5)
Eosinophils Relative: 2 %
HCT: 28.8 % — ABNORMAL LOW (ref 36.0–46.0)
Hemoglobin: 8.7 g/dL — ABNORMAL LOW (ref 12.0–15.0)
Immature Granulocytes: 0 %
Lymphocytes Relative: 18 %
Lymphs Abs: 1.4 10*3/uL (ref 0.7–4.0)
MCH: 27.5 pg (ref 26.0–34.0)
MCHC: 30.2 g/dL (ref 30.0–36.0)
MCV: 91.1 fL (ref 80.0–100.0)
Monocytes Absolute: 0.5 10*3/uL (ref 0.1–1.0)
Monocytes Relative: 7 %
Neutro Abs: 5.3 10*3/uL (ref 1.7–7.7)
Neutrophils Relative %: 73 %
Platelets: 255 10*3/uL (ref 150–400)
RBC: 3.16 MIL/uL — ABNORMAL LOW (ref 3.87–5.11)
RDW: 18.5 % — ABNORMAL HIGH (ref 11.5–15.5)
WBC: 7.4 10*3/uL (ref 4.0–10.5)
nRBC: 0 % (ref 0.0–0.2)

## 2020-05-29 LAB — POCT CBC
Granulocyte percent: 70.1 %G (ref 37–80)
HCT, POC: 27.4 % — AB (ref 29–41)
Hemoglobin: 8.9 g/dL — AB (ref 11–14.6)
Lymph, poc: 1.7 (ref 0.6–3.4)
MCH, POC: 28.1 pg (ref 27–31.2)
MCHC: 32.4 g/dL (ref 31.8–35.4)
MCV: 86.8 fL (ref 76–111)
MID (cbc): 0.3 (ref 0–0.9)
MPV: 7.3 fL (ref 0–99.8)
POC Granulocyte: 4.6 (ref 2–6.9)
POC LYMPH PERCENT: 25.6 %L (ref 10–50)
POC MID %: 4.3 %M (ref 0–12)
Platelet Count, POC: 282 10*3/uL (ref 142–424)
RBC: 3.16 M/uL — AB (ref 4.04–5.48)
RDW, POC: 19.4 %
WBC: 6.5 10*3/uL (ref 4.6–10.2)

## 2020-05-29 LAB — TROPONIN I (HIGH SENSITIVITY)
Troponin I (High Sensitivity): 5 ng/L (ref <18)
Troponin I (High Sensitivity): 6 ng/L (ref <18)

## 2020-05-29 LAB — COMPREHENSIVE METABOLIC PANEL
ALT: 10 U/L (ref 0–44)
AST: 11 U/L — ABNORMAL LOW (ref 15–41)
Albumin: 3.3 g/dL — ABNORMAL LOW (ref 3.5–5.0)
Alkaline Phosphatase: 86 U/L (ref 38–126)
Anion gap: 10 (ref 5–15)
BUN: 28 mg/dL — ABNORMAL HIGH (ref 8–23)
CO2: 18 mmol/L — ABNORMAL LOW (ref 22–32)
Calcium: 9 mg/dL (ref 8.9–10.3)
Chloride: 112 mmol/L — ABNORMAL HIGH (ref 98–111)
Creatinine, Ser: 2.73 mg/dL — ABNORMAL HIGH (ref 0.44–1.00)
GFR, Estimated: 19 mL/min — ABNORMAL LOW (ref >60)
Glucose, Bld: 96 mg/dL (ref 70–99)
Potassium: 4.7 mmol/L (ref 3.5–5.1)
Sodium: 140 mmol/L (ref 135–145)
Total Bilirubin: 0.3 mg/dL (ref 0.3–1.2)
Total Protein: 6.5 g/dL (ref 6.5–8.1)

## 2020-05-29 LAB — GLUCOSE, POCT (MANUAL RESULT ENTRY): POC Glucose: 98 mg/dl (ref 70–99)

## 2020-05-29 LAB — RESPIRATORY PANEL BY RT PCR (FLU A&B, COVID)
Influenza A by PCR: NEGATIVE
Influenza B by PCR: NEGATIVE
SARS Coronavirus 2 by RT PCR: NEGATIVE

## 2020-05-29 LAB — POC OCCULT BLOOD, ED: Fecal Occult Bld: NEGATIVE

## 2020-05-29 LAB — RETICULOCYTES
Immature Retic Fract: 20.9 % — ABNORMAL HIGH (ref 2.3–15.9)
RBC.: 3.18 MIL/uL — ABNORMAL LOW (ref 3.87–5.11)
Retic Count, Absolute: 63 10*3/uL (ref 19.0–186.0)
Retic Ct Pct: 2 % (ref 0.4–3.1)

## 2020-05-29 LAB — BRAIN NATRIURETIC PEPTIDE: B Natriuretic Peptide: 265.7 pg/mL — ABNORMAL HIGH (ref 0.0–100.0)

## 2020-05-29 MED ORDER — VALBENAZINE TOSYLATE 40 MG PO CAPS
80.0000 mg | ORAL_CAPSULE | Freq: Every day | ORAL | Status: DC
  Administered 2020-05-31: 80 mg via ORAL
  Filled 2020-05-29 (×3): qty 2

## 2020-05-29 MED ORDER — MECLIZINE HCL 25 MG PO TABS
25.0000 mg | ORAL_TABLET | Freq: Once | ORAL | Status: AC
  Administered 2020-05-29: 25 mg via ORAL
  Filled 2020-05-29: qty 1

## 2020-05-29 MED ORDER — MELATONIN 5 MG PO TABS
5.0000 mg | ORAL_TABLET | Freq: Every day | ORAL | Status: DC
  Administered 2020-05-29 – 2020-05-31 (×3): 5 mg via ORAL
  Filled 2020-05-29 (×3): qty 1

## 2020-05-29 MED ORDER — SODIUM CHLORIDE 0.9 % IV SOLN: INTRAVENOUS | Status: DC

## 2020-05-29 MED ORDER — ACETAMINOPHEN 325 MG PO TABS
650.0000 mg | ORAL_TABLET | Freq: Four times a day (QID) | ORAL | Status: DC | PRN
  Filled 2020-05-29: qty 2

## 2020-05-29 MED ORDER — GABAPENTIN 300 MG PO CAPS
300.0000 mg | ORAL_CAPSULE | Freq: Two times a day (BID) | ORAL | Status: DC
  Administered 2020-05-29 – 2020-06-01 (×6): 300 mg via ORAL
  Filled 2020-05-29 (×6): qty 1

## 2020-05-29 MED ORDER — PANTOPRAZOLE SODIUM 20 MG PO TBEC
20.0000 mg | DELAYED_RELEASE_TABLET | Freq: Two times a day (BID) | ORAL | Status: DC
  Administered 2020-05-29 – 2020-05-30 (×2): 20 mg via ORAL
  Filled 2020-05-29 (×2): qty 1

## 2020-05-29 MED ORDER — AMLODIPINE BESYLATE 10 MG PO TABS
10.0000 mg | ORAL_TABLET | Freq: Every day | ORAL | Status: DC
  Administered 2020-05-30 – 2020-06-01 (×3): 10 mg via ORAL
  Filled 2020-05-29 (×3): qty 1

## 2020-05-29 MED ORDER — LABETALOL HCL 200 MG PO TABS
300.0000 mg | ORAL_TABLET | Freq: Two times a day (BID) | ORAL | Status: DC
  Administered 2020-05-30 – 2020-06-01 (×6): 300 mg via ORAL
  Filled 2020-05-29 (×4): qty 1
  Filled 2020-05-29: qty 2
  Filled 2020-05-29 (×3): qty 1

## 2020-05-29 MED ORDER — ACETAMINOPHEN 650 MG RE SUPP: 650.0000 mg | Freq: Four times a day (QID) | RECTAL | Status: DC | PRN

## 2020-05-29 MED ORDER — FLUOXETINE HCL 20 MG PO CAPS
40.0000 mg | ORAL_CAPSULE | Freq: Every day | ORAL | Status: DC
  Administered 2020-05-30 – 2020-06-01 (×3): 40 mg via ORAL
  Filled 2020-05-29 (×3): qty 2

## 2020-05-29 MED ORDER — SODIUM CHLORIDE 0.9% FLUSH
3.0000 mL | Freq: Two times a day (BID) | INTRAVENOUS | Status: DC
  Administered 2020-05-29 – 2020-06-01 (×5): 3 mL via INTRAVENOUS

## 2020-05-29 MED ORDER — FENTANYL CITRATE (PF) 100 MCG/2ML IJ SOLN
50.0000 ug | Freq: Once | INTRAMUSCULAR | Status: AC
  Administered 2020-05-29: 50 ug via INTRAVENOUS
  Filled 2020-05-29: qty 2

## 2020-05-29 MED ORDER — SODIUM CHLORIDE 0.9 % IV BOLUS
500.0000 mL | Freq: Once | INTRAVENOUS | Status: AC
  Administered 2020-05-29: 500 mL via INTRAVENOUS

## 2020-05-29 MED ORDER — ISOSORBIDE MONONITRATE ER 60 MG PO TB24
60.0000 mg | ORAL_TABLET | Freq: Every day | ORAL | Status: DC
  Administered 2020-05-30 – 2020-06-01 (×3): 60 mg via ORAL
  Filled 2020-05-29 (×3): qty 1

## 2020-05-29 MED ORDER — MORPHINE SULFATE (PF) 4 MG/ML IV SOLN
4.0000 mg | Freq: Once | INTRAVENOUS | Status: AC
  Administered 2020-05-29: 4 mg via INTRAVENOUS
  Filled 2020-05-29: qty 1

## 2020-05-29 MED ORDER — ONDANSETRON HCL 4 MG/2ML IJ SOLN
4.0000 mg | Freq: Once | INTRAMUSCULAR | Status: AC
  Administered 2020-05-29: 4 mg via INTRAVENOUS
  Filled 2020-05-29: qty 2

## 2020-05-29 MED ORDER — DICLOFENAC SODIUM 1 % EX GEL
2.0000 g | Freq: Four times a day (QID) | CUTANEOUS | Status: DC
  Administered 2020-05-29 – 2020-06-01 (×8): 2 g via TOPICAL
  Filled 2020-05-29: qty 100

## 2020-05-29 MED ORDER — HEPARIN SODIUM (PORCINE) 5000 UNIT/ML IJ SOLN
5000.0000 [IU] | Freq: Three times a day (TID) | INTRAMUSCULAR | Status: DC
  Administered 2020-05-29 – 2020-06-01 (×8): 5000 [IU] via SUBCUTANEOUS
  Filled 2020-05-29 (×8): qty 1

## 2020-05-29 MED ORDER — FERROUS SULFATE 325 (65 FE) MG PO TABS
325.0000 mg | ORAL_TABLET | Freq: Every day | ORAL | Status: DC
  Administered 2020-05-30 – 2020-06-01 (×3): 325 mg via ORAL
  Filled 2020-05-29 (×3): qty 1

## 2020-05-29 MED ORDER — ONDANSETRON HCL 4 MG PO TABS
4.0000 mg | ORAL_TABLET | Freq: Three times a day (TID) | ORAL | Status: DC | PRN
  Administered 2020-05-30 – 2020-05-31 (×3): 4 mg via ORAL
  Filled 2020-05-29 (×3): qty 1

## 2020-05-29 MED ORDER — ACETAMINOPHEN 325 MG PO TABS
650.0000 mg | ORAL_TABLET | Freq: Once | ORAL | Status: AC
  Administered 2020-05-29: 650 mg via ORAL
  Filled 2020-05-29: qty 2

## 2020-05-29 MED ORDER — NICOTINE 21 MG/24HR TD PT24
21.0000 mg | MEDICATED_PATCH | Freq: Every day | TRANSDERMAL | Status: DC
  Administered 2020-05-29 – 2020-06-01 (×4): 21 mg via TRANSDERMAL
  Filled 2020-05-29 (×4): qty 1

## 2020-05-29 NOTE — Patient Instructions (Addendum)
   ER eval today, follow up after as recommended.   If you have lab work done today you will be contacted with your lab results within the next 2 weeks.  If you have not heard from [place] then please contact us. The fastest way to get your results is to register for My Chart.   IF you received an x-ray today, you will receive an invoice from [facility]. Please contact [facility] at [phone] with questions or concerns regarding your invoice.   IF you received labwork today, you will receive an invoice from [place]. Please contact [facility] at [phone] with questions or concerns regarding your invoice.   Our billing staff will not be able to assist you with questions regarding bills from these companies.  You will be contacted with the lab results as soon as they are available. The fastest way to get your results is to activate your My Chart account. Instructions are located on the last page of this paperwork. If you have not heard from [place] regarding the results in 2 weeks, please contact this office.

## 2020-05-29 NOTE — ED Provider Notes (Signed)
[place] EMERGENCY DEPARTMENT Provider Note   CSN: [id number] Arrival date & time: 05/29/20  1325     History Chief Complaint  Patient presents with  . Near Syncope  . Hypotension    orthostatic   . Chest Pain    Sherry [name] is a 64 y.o. female past medical history of chronic kidney disease, CHF, hypertension brought in by EMS for evaluation of near syncope, lightheadedness, dizziness, chest pain.  She reports that 4 days ago, she had an episode where she felt dizzy.  She states she was walking to a church function and somebody had to help her into the restaurant.  She states that over the weekend, she still felt dizzy.  She felt like it was always there but would slightly improve if she sat still.  Whenever she would get up or try and walk around, she felt like the dizziness would occur and get worse.  She reports associated nausea.  She went to her primary care doctor for recheck of her kidney function as well as evaluation of her dizziness.  She reports that today while she was going to the doctor's office, she felt like the dizziness is getting worse.  She states she felt like she was going to pass out as she was walking.  At the doctor's office, she started having episodes of chest pain.  She states it is like a sharp pain to her chest and is worse when her arm is moved.  She states the pain radiates down her arm.  She felt like she got diaphoretic and shortness of breath with the pain.  She states the pain is worse when it is touched or when she takes a deep breath.  She has not been sick recently denies any fever, cough chills.  She states she smokes a pack of cigarettes a day.  She has a history of hypertension but no history of diabetes.  No personal cardiac history.  No family history of MI before the age of 82.  She reports she was recently mated to [facility] at the end of October for evaluation of kidney disease.  She denies any history of blood clots in  her legs or lungs, recent leg swelling, recent travel, surgeries,  hormone replacement therapy, OCP.  The history is provided by the patient.       Past Medical History:  Diagnosis Date  . Anxiety   . Bipolar and related disorder ([place])   . Blood transfusion without reported diagnosis   . Breast cancer ([place])    right lumpectemy and lymph node removal  . CHF (congestive heart failure) ([place])   . Chronic back pain   . Chronic kidney disease    "Stage IV" - states due to hypertension  . Depression   . GERD (gastroesophageal reflux disease)   . Hepatitis C   . Hypertension   . Left-sided weakness   . MRSA infection    of breast incision  . Opioid dependence ([place])    Has been treated in [facility] in past  . Substance abuse ([place])   . TIA (transient ischemic attack) 2020   P/w BP >230/120 and neurologic symptoms, presumed TIA    Patient Active Problem List   Diagnosis Date Noted  . Chronic diastolic CHF (congestive heart failure) ([place]) 05/09/2019  . GERD (gastroesophageal reflux disease) 05/09/2019  . Alcohol abuse 05/09/2019  . Hypertension 05/08/2019  . Bilateral low back pain with bilateral sciatica 01/05/2019  .  Cyst of ovary   . Pre-operative cardiovascular examination 06/13/2018  . Dizziness 02/24/2018  . Left-sided weakness 02/24/2018  . Cigarette smoker 12/31/2017  . DOE (dyspnea on exertion) 12/30/2017  . Phlebitis after infusion 10/28/2017  . Hypertensive emergency 10/21/2017  . Microcytic anemia 10/21/2017  . Medication intolerance 09/06/2017  . Neuroleptic-induced tardive dyskinesia 09/06/2017  . Cancer ([place]) 09/06/2017  . Chronic low back pain 09/06/2017  . Grief reaction with prolonged bereavement 09/06/2017  . Opioid use disorder, severe, dependence ([place]) 08/27/2017  . Alcohol use disorder, severe, dependence ([place]) 08/27/2017  . Substance induced mood disorder ([place]) 08/13/2017  . AKI (acute kidney injury) ([place])   . MDD (major depressive disorder),  recurrent severe, without psychosis ([place])   . Alcohol withdrawal ([place]) 08/09/2017  . Essential hypertension 07/06/2017  . Chronic kidney disease, stage III (moderate) 11/17/2016  . Depression with anxiety 08/21/2016  . Hx of bipolar disorder 01/31/2016  . History of breast cancer 02/13/2013  . Morbid obesity due to excess calories ([place]) 02/13/2013  . Hepatitis C virus infection cured after antiviral drug therapy 12/17/2012    Past Surgical History:  Procedure Laterality Date  . BACK SURGERY  1990  . BREAST SURGERY Right 2010   "breast cancer survivor" - states partial mastectomy and nodes  . COLONOSCOPY     [facility]  . ESOPHAGOGASTRODUODENOSCOPY     [facility]  . ESOPHAGOGASTRODUODENOSCOPY  04/18/2020   [facility]  . LAPAROSCOPIC CHOLECYSTECTOMY  2002  . LAPAROSCOPIC INCISIONAL / UMBILICAL / VENTRAL HERNIA REPAIR  04/13/2018   with BARD 15x 79GX mesh (supraumbilical)  . LAPAROSCOPIC LYSIS OF ADHESIONS  07/12/2018   Procedure: LAPAROSCOPIC LYSIS OF ADHESIONS;  Surgeon: Isabel Caprice, MD;  Location: [facility];  Service: Gynecology;;  . MYOMECTOMY     x 2 prior to hysterectomy  . ROBOTIC ASSISTED BILATERAL SALPINGO OOPHERECTOMY Right 07/12/2018   Procedure: XI ROBOTIC ASSISTED RIGHT SALPINGO OOPHORECTOMY;  Surgeon: Isabel Caprice, MD;  Location: [facility];  Service: Gynecology;  Laterality: Right;  . TOTAL ABDOMINAL HYSTERECTOMY     fibroids   . UPPER GASTROINTESTINAL ENDOSCOPY       OB History    Gravida  2   Para  0   Term      Preterm      AB  0   Living        SAB      TAB      Ectopic      Multiple      Live Births              Family History  Problem Relation Age of Onset  . Heart attack Mother   . Breast cancer Mother 80  . Dementia Mother   . Cancer Father 7       died of bleeding from kidneys  . Colon cancer Neg Hx   . Esophageal cancer Neg Hx   . Stomach cancer Neg Hx   . Rectal cancer Neg Hx     Social History     Tobacco Use  . Smoking status: Current Every Day Smoker    Packs/day: 0.25    Types: Cigarettes  . Smokeless tobacco: Never Used  . Tobacco comment: 4 cigs per day 01/2019  Vaping Use  . Vaping Use: Former  . Start date: 06/27/2013  . Quit date: 06/14/2017  . Devices: Apple Cinnamon-0 mg  Substance Use Topics  . Alcohol use: No  . Drug use:  Not Currently    Comment: h/o IVD use    Home Medications Prior to Admission medications   Medication Sig Start Date End Date Taking? Authorizing Provider  amLODipine (NORVASC) 10 MG tablet Take 1 tablet (10 mg total) by mouth daily. 09/13/19  Yes Forrest Moron, MD  Cholecalciferol (VITAMIN D3) 50 MCG (2000 UT) capsule Take 2,000 Units by mouth daily.   Yes [provider]  FEROSUL 325 (65 Fe) MG tablet TAKE 1 TABLET BY MOUTH DAILY WITH BREAKFAST Patient taking differently: Take 325 mg by mouth daily with breakfast.  11/29/19  Yes Wendie Agreste, MD  FLUoxetine (PROZAC) 40 MG capsule Take 40 mg by mouth daily.  11/09/18  Yes [provider]  gabapentin (NEURONTIN) 800 MG tablet Take 800 mg by mouth 3 (three) times daily.   Yes [provider]  INGREZZA 80 MG CAPS Take 80 mg by mouth daily. 10/15/17  Yes [provider]  isosorbide mononitrate (IMDUR) 60 MG 24 hr tablet TAKE 1 TABLET BY MOUTH DAILY Patient taking differently: Take 60 mg by mouth daily.  03/12/20  Yes Wendie Agreste, MD  labetalol (NORMODYNE) 300 MG tablet Take 300 mg by mouth 2 (two) times daily.   Yes [provider]  melatonin 5 MG TABS Take 5 mg by mouth at bedtime.   Yes [provider]  ondansetron (ZOFRAN) 4 MG tablet TAKE 1 TABLET BY MOUTH EVERY 8 HOURS AS NEEDED FOR NAUSEA OR VOMITING Patient taking differently: Take 4 mg by mouth every 8 (eight) hours as needed for nausea or vomiting.  01/12/20  Yes Wendie Agreste, MD  pantoprazole (PROTONIX) 20 MG tablet Take 1 tablet (20 mg total) by mouth 2 (two) times daily.  11/27/19  Yes Wendie Agreste, MD  atorvastatin (LIPITOR) 40 MG tablet TAKE 1 TABLET (40 MG TOTAL) BY MOUTH DAILY AT 6 PM. Patient not taking: Reported on 05/08/2020 03/12/20   Wendie Agreste, MD  hydrALAZINE (APRESOLINE) 25 MG tablet Take 1 tablet (25 mg total) by mouth 3 (three) times daily. Patient not taking: Reported on 05/29/2020 05/11/19   Edwin Dada, MD  labetalol (NORMODYNE) 200 MG tablet Take 1 tablet (200 mg total) by mouth 2 (two) times daily. Patient not taking: Reported on 05/29/2020 05/11/19   Edwin Dada, MD    Allergies    Abilify [aripiprazole], Remeron [mirtazapine], Trazodone and nefazodone, Flexeril [cyclobenzaprine], and Amoxicillin  Review of Systems   Review of Systems  Constitutional: Negative for fever.  Respiratory: Negative for cough and shortness of breath.   Cardiovascular: Positive for chest pain.  Gastrointestinal: Negative for abdominal pain, nausea and vomiting.  Genitourinary: Negative for dysuria and hematuria.  Neurological: Positive for dizziness and light-headedness. Negative for weakness, numbness and headaches.  All other systems reviewed and are negative.   Physical Exam Updated Vital Signs BP (!) 164/83   Pulse 74   Temp 98.2 F (36.8 C) (Oral)   Resp 18   Ht 5\' 2"  (1.575 m) Comment: per record  Wt 105.7 kg   SpO2 97%   BMI 42.62 kg/m   Physical Exam Vitals and nursing note reviewed.  Constitutional:      Appearance: Normal appearance. She is well-developed.  HENT:     Head: Normocephalic and atraumatic.  Eyes:     General: Lids are normal.     Conjunctiva/sclera: Conjunctivae normal.     Pupils: Pupils are equal, round, and reactive to light.     Comments:  PERRL. EOMs intact. No nystagmus. No neglect.   Cardiovascular:     Rate and Rhythm: Normal rate and regular rhythm.     Pulses: Normal pulses.          Radial pulses are 2+ on the right side and 2+ on the left side.       Dorsalis pedis pulses  are 2+ on the right side and 2+ on the left side.     Heart sounds: Normal heart sounds. No murmur heard.  No friction rub. No gallop.   Pulmonary:     Effort: Pulmonary effort is normal.     Breath sounds: Normal breath sounds.     Comments: Lungs clear to auscultation bilaterally.  Symmetric chest rise.  No wheezing, rales, rhonchi. Chest:       Comments: Pain reproduced with palpation of the anterior left chest wall as well as with movement of the left upper extremity. Abdominal:     Palpations: Abdomen is soft. Abdomen is not rigid.     Tenderness: There is no abdominal tenderness. There is no guarding.     Comments: Abdomen is soft, non-distended, non-tender. No rigidity, No guarding. No peritoneal signs.  Genitourinary:    Comments: The exam was performed with a chaperone present Caryl Pina, Therapist, sports). Digital Rectal Exam reveals sphincter with good tone. No external hemorrhoids. No masses or fissures. Stool color is brown with no overt blood. Musculoskeletal:        General: Normal range of motion.     Cervical back: Full passive range of motion without pain.  Skin:    General: Skin is warm and dry.     Capillary Refill: Capillary refill takes less than 2 seconds.  Neurological:     Mental Status: She is alert and oriented to person, place, and time.     Comments: Cranial nerves III-XII intact Follows commands, Moves all extremities  5/5 strength to BUE and BLE  Sensation intact throughout all major nerve distributions Normal finger to nose. No dysdiadochokinesia. No slurred speech. No facial droop.   Psychiatric:        Speech: Speech normal.     ED Results / Procedures / Treatments   Labs (all labs ordered are listed, but only abnormal results are displayed) Labs Reviewed  COMPREHENSIVE METABOLIC PANEL - Abnormal; Notable for the following components:      Result Value   Chloride 112 (*)    CO2 18 (*)    BUN 28 (*)    Creatinine, Ser 2.73 (*)    Albumin 3.3 (*)    AST  11 (*)    GFR, Estimated 19 (*)    All other components within normal limits  BRAIN NATRIURETIC PEPTIDE - Abnormal; Notable for the following components:   B Natriuretic Peptide 265.7 (*)    All other components within normal limits  CBC WITH DIFFERENTIAL/PLATELET - Abnormal; Notable for the following components:   RBC 3.16 (*)    Hemoglobin 8.7 (*)    HCT 28.8 (*)    RDW 18.5 (*)    All other components within normal limits  URINALYSIS, ROUTINE W REFLEX MICROSCOPIC  POC OCCULT BLOOD, ED  TROPONIN I (HIGH SENSITIVITY)  TROPONIN I (HIGH SENSITIVITY)    EKG None  Radiology DG Chest Portable 1 View  Result Date: 05/29/2020 CLINICAL DATA:  Chest pain EXAM: PORTABLE CHEST 1 VIEW COMPARISON:  05/08/2019 FINDINGS: Artifact overlies the chest. Mild cardiomegaly. Mediastinal shadows are normal. Allowing for technical factors, the lungs are  clear. No infiltrate, edema, collapse or effusion. No acute bone finding. IMPRESSION: Mild cardiomegaly. No active disease. Electronically Signed   By: Nelson Chimes M.D.   On: 05/29/2020 15:03    Procedures Procedures (including critical care time)  Medications Ordered in ED Medications  meclizine (ANTIVERT) tablet 25 mg (has no administration in time range)  sodium chloride 0.9 % bolus 500 mL (has no administration in time range)  fentaNYL (SUBLIMAZE) injection 50 mcg (has no administration in time range)  ondansetron (ZOFRAN) injection 4 mg (4 mg Intravenous Given 05/29/20 1424)  morphine 4 MG/ML injection 4 mg (4 mg Intravenous Given 05/29/20 1424)    ED Course  I have reviewed the triage vital signs and the nursing notes.  Pertinent labs & imaging results that were available during my care of the patient were reviewed by me and considered in my medical decision making (see chart for details).    MDM Rules/Calculators/A&P                           64 year old female brought in by EMS from PCP office for evaluation of lightheadedness, near  syncope, chest wall pain, dizziness.  She reports that over the last 4 days, she has had dizziness.  She states that sporadically worse when she gets up and walks around.  She states if she sits still, it gets better.  She was also seeing her doctor for evaluation of her kidney function.  She reports she was recently admitted to [facility] at the end of October for evaluation of her kidney function.  While she was at the doctors, she started having some chest pain and was found to be orthostatic.  She was sent to the ED for further evaluation.  She reports chest pain is in the left chest and radiates to the arm.  She does report that she got slightly diaphoretic and nauseous with it but has been nauseous with the lightheadedness/dizziness.  She reports that it is worse when we press on her chest or when we move her arm.  On initial arrival, she is afebrile, nontoxic-appearing.  Vital signs are stable.  On exam, no nystagmus.  No neuro deficits.  Chest pain is reproducible with palpation of the anterior chest wall movement of the left upper extremity.  She has good equal pulses in all four extremities.  Question if this is orthostatic hypotension versus vertigo.  Low suspicion for CVA but also consideration.  I suspect her chest pain is most likely musculoskeletal in nature.  It is reproducible with palpation but ACS is a consideration.  Do not suspect infectious process or PE.  Plan to check chest x-ray, labs, CT head.  Will give meclizine, analgesics.  Initial troponin is negative.  BNP is 265.7.  CBC shows no leukocytosis.  Hemoglobin is 8.7.  I reviewed her records.  Her hemoglobin is usually at baseline of 10.  She did have her hemoglobin checked yesterday and it was 8.9.  Patient states she is on iron pills and has noticed that her stools have been black since taking iron.  She has not noted any blood in her stools.  CMP shows BUN of 28, creatinine of 2.73.  Potassium is 4.7.  Chest x-ray shows  mild cardiomegaly.  No evidence of active disease.  Rectal exam showed no evidence of melena.  I suspect this may be anemia of chronic disease from her kidney function.  Patient signed out to  Wyn Quaker, PA-C pending re-evaluation, delta trop, head CT.   Portions of this note were generated with [employer]. Dictation errors may occur despite best attempts at proofreading.   Final Clinical Impression(s) / ED Diagnoses Final diagnoses:  None    Rx / DC Orders ED Discharge Orders    None       Volanda Napoleon, PA-C 05/29/20 1546    Veryl Speak, MD 05/30/20 [phone]

## 2020-05-29 NOTE — ED Notes (Signed)
Per PA, pt told she is not going to be receiving any pain meds.

## 2020-05-29 NOTE — ED Triage Notes (Signed)
Pt to ED via EMS from from PCP, pt was there for follow up for kidney function hx stage 4 renal failure. Pt also reporting anterior chest wall pain, pain to touch . Pt noted to have orthostatic changes. C/O feeling dizzy.  Hx anemia- hgb 8.9. Last VS: 132/67, 97%ra, 98 cbg, hr 62. HX HTN, CHF. No medications given by EMS.

## 2020-05-29 NOTE — Progress Notes (Signed)
Subjective:  Patient ID: Sherry [name], female    DOB: 06/09/1956  Age: 64 y.o. MRN: [id number]  CC:  Chief Complaint  Patient presents with  . Back Pain    3 week f/u   . Abdominal Pain    f/u --getting better  . Dizziness    started this AM   . Nausea    started this AM   . Fatigue    can not stand for long periods of time. feels like she is going to fall  . Diarrhea    started this AM    HPI Sherry [name] presents for  Multiple concerns above. Abdominal pain She was last seen October 27.  Plan for pain management follow-up as chronic pain, was tolerating PPI.  Symptoms are improving.  Chronic back pain With history of narcotic addiction, see office visit October 27.  Off gabapentin at that time because of overuse.  Was not taking tramadol.  We had discussed her concerns regarding any narcotics or gabapentin if at all possible due to previous addiction.  Pain management follow-up planned, including possible TENS unit.  We also discussed alternative therapy such as acupuncture.  She has taken fluoxetine for depression previously, option of Cymbalta but with her tardive dyskinesia and history of nausea some concern regarding tolerability. Started back on gabapentin about a week ago. 3 per day - denies additional doses. Has seen back surgeon- planning on MRI.   Chest Pain, near syncope.  Today reports that she has had nausea, dizziness - starting this morning. Near syncope, but no syncope. Started with left sided chest pain 7-8/10 about 1-2 hours ago. Left chest, throbbing pain, radiating down left arm to 4th-5th fingers. Feels clammy, some diaphoresis.   Associated dyspnea. diarrhea starting this morning - 1 episode. Unable to produce urine in office currently, last uop at 7 am.  Fatigue with inability to stand  - feels like unable to stand.  Feels chills, no fever today. No cough, headache, or change in taste/smell. No sick contacts. No prior known hx of heart disease.     She has  a history of chronic kidney disease, followed by Dr. Moshe [name] with [facility], last appointment yesterday - told HGb was low - plan on iron infusion next week. Unknown hgb. Labetalol adjusted. Hydralazine also adjusted.   Gabapentin 800 mg 3 times daily dosing has also been discussed with nephrology given her stage of kidney disease.  Recommend max dose of 700 mg daily.  She has a history of chronic anemia, due to chronic kidney disease, hemoglobin 9.9 on August 18 nephrology visit   Creatinine had been increasing up to that visit. Lab Results  Component Value Date   CREATININE 2.35 (H) 05/20/2020   Lab Results  Component Value Date   WBC 6.6 03/30/2020   HGB 10.0 (L) 03/30/2020   HCT 31.7 (L) 03/30/2020   MCV 85.4 03/30/2020   PLT 257 03/30/2020    History Patient Active Problem List   Diagnosis Date Noted  . Chronic diastolic CHF (congestive heart failure) ([place]) 05/09/2019  . GERD (gastroesophageal reflux disease) 05/09/2019  . Alcohol abuse 05/09/2019  . Hypertension 05/08/2019  . Bilateral low back pain with bilateral sciatica 01/05/2019  . Cyst of ovary   . Pre-operative cardiovascular examination 06/13/2018  . Dizziness 02/24/2018  . Left-sided weakness 02/24/2018  . Cigarette smoker 12/31/2017  . DOE (dyspnea on exertion) 12/30/2017  . Phlebitis after infusion 10/28/2017  . Hypertensive emergency 10/21/2017  . Microcytic  anemia 10/21/2017  . Medication intolerance 09/06/2017  . Neuroleptic-induced tardive dyskinesia 09/06/2017  . Cancer ([place]) 09/06/2017  . Chronic low back pain 09/06/2017  . Grief reaction with prolonged bereavement 09/06/2017  . Opioid use disorder, severe, dependence ([place]) 08/27/2017  . Alcohol use disorder, severe, dependence ([place]) 08/27/2017  . Substance induced mood disorder ([place]) 08/13/2017  . AKI (acute kidney injury) ([place])   . MDD (major depressive disorder), recurrent severe, without psychosis ([place])   . Alcohol withdrawal  ([place]) 08/09/2017  . Essential hypertension 07/06/2017  . Chronic kidney disease, stage III (moderate) 11/17/2016  . Depression with anxiety 08/21/2016  . Hx of bipolar disorder 01/31/2016  . History of breast cancer 02/13/2013  . Morbid obesity due to excess calories ([place]) 02/13/2013  . Hepatitis C virus infection cured after antiviral drug therapy 12/17/2012   Past Medical History:  Diagnosis Date  . Anxiety   . Bipolar and related disorder ([place])   . Blood transfusion without reported diagnosis   . Breast cancer ([place])    right lumpectemy and lymph node removal  . CHF (congestive heart failure) ([place])   . Chronic back pain   . Chronic kidney disease    "Stage IV" - states due to hypertension  . Depression   . GERD (gastroesophageal reflux disease)   . Hepatitis C   . Hypertension   . Left-sided weakness   . MRSA infection    of breast incision  . Opioid dependence ([place])    Has been treated in [facility] in past  . Substance abuse ([place])   . TIA (transient ischemic attack) 2020   P/w BP >230/120 and neurologic symptoms, presumed TIA   Past Surgical History:  Procedure Laterality Date  . BACK SURGERY  1990  . BREAST SURGERY Right 2010   "breast cancer survivor" - states partial mastectomy and nodes  . COLONOSCOPY     [facility]  . ESOPHAGOGASTRODUODENOSCOPY     [facility]  . ESOPHAGOGASTRODUODENOSCOPY  04/18/2020   [facility]  . LAPAROSCOPIC CHOLECYSTECTOMY  2002  . LAPAROSCOPIC INCISIONAL / UMBILICAL / VENTRAL HERNIA REPAIR  04/13/2018   with BARD 15x 60YT mesh (supraumbilical)  . LAPAROSCOPIC LYSIS OF ADHESIONS  07/12/2018   Procedure: LAPAROSCOPIC LYSIS OF ADHESIONS;  Surgeon: Isabel Caprice, MD;  Location: [facility];  Service: Gynecology;;  . MYOMECTOMY     x 2 prior to hysterectomy  . ROBOTIC ASSISTED BILATERAL SALPINGO OOPHERECTOMY Right 07/12/2018   Procedure: XI ROBOTIC ASSISTED RIGHT SALPINGO OOPHORECTOMY;  Surgeon: Isabel Caprice, MD;   Location: [facility];  Service: Gynecology;  Laterality: Right;  . TOTAL ABDOMINAL HYSTERECTOMY     fibroids   . UPPER GASTROINTESTINAL ENDOSCOPY     Allergies  Allergen Reactions  . Abilify [Aripiprazole] Other (See Comments)    Tardive dyskinesia Oral  . Remeron [Mirtazapine] Other (See Comments)    Wgt stimulation /gain, Dizziness, Patient says "can tolerate"  . Trazodone And Nefazodone Other (See Comments)    Nightmares/sleep diturbance  . Flexeril [Cyclobenzaprine] Other (See Comments)    Pt states Flexeril makes her feel depressed   . Amoxicillin Diarrhea and Other (See Comments)    NOTE the patient has had PCN WITHOUT reaction Has patient had a PCN reaction causing immediate rash, facial/tongue/throat swelling, SOB or lightheadedness with hypotension: No Has patient had a PCN reaction causing severe rash involving mucus membranes or skin necrosis: No Has patient had a PCN reaction that required hospitalization: No Has patient had  a PCN reaction occurring within the last 10 years: No If all of the above answers are "NO", then may proceed with Cephalosporin use.    Prior to Admission medications   Medication Sig Start Date End Date Taking? Authorizing Provider  amLODipine (NORVASC) 10 MG tablet Take 1 tablet (10 mg total) by mouth daily. 09/13/19  Yes Forrest Moron, MD  Cholecalciferol (VITAMIN D3) 50 MCG (2000 UT) capsule Take 2,000 Units by mouth daily.   Yes [provider]  FEROSUL 325 (65 Fe) MG tablet TAKE 1 TABLET BY MOUTH DAILY WITH BREAKFAST 11/29/19  Yes Wendie Agreste, MD  FLUoxetine (PROZAC) 40 MG capsule Take 40 mg by mouth daily.  11/09/18  Yes [provider]  gabapentin (NEURONTIN) 800 MG tablet Take 800 mg by mouth 3 (three) times daily.   Yes [provider]  hydrALAZINE (APRESOLINE) 25 MG tablet Take 1 tablet (25 mg total) by mouth 3 (three) times daily. 05/11/19  Yes Danford, Suann Larry, MD  INGREZZA 80 MG CAPS Take 80 mg by mouth  daily. 10/15/17  Yes [provider]  isosorbide mononitrate (IMDUR) 60 MG 24 hr tablet TAKE 1 TABLET BY MOUTH DAILY 03/12/20  Yes Wendie Agreste, MD  labetalol (NORMODYNE) 200 MG tablet Take 1 tablet (200 mg total) by mouth 2 (two) times daily. 05/11/19  Yes Danford, Suann Larry, MD  ondansetron (ZOFRAN) 4 MG tablet TAKE 1 TABLET BY MOUTH EVERY 8 HOURS AS NEEDED FOR NAUSEA OR VOMITING 01/12/20  Yes Wendie Agreste, MD  pantoprazole (PROTONIX) 20 MG tablet Take 1 tablet (20 mg total) by mouth 2 (two) times daily. 11/27/19  Yes Wendie Agreste, MD  atorvastatin (LIPITOR) 40 MG tablet TAKE 1 TABLET (40 MG TOTAL) BY MOUTH DAILY AT 6 PM. Patient not taking: Reported on 05/08/2020 03/12/20   Wendie Agreste, MD  dicyclomine (BENTYL) 10 MG capsule Take by mouth at bedtime. Patient not taking: Reported on 05/08/2020 03/16/20   [provider]  furosemide (LASIX) 20 MG tablet Take 20 mg by mouth daily. Patient not taking: Reported on 05/08/2020 03/12/20   [provider]  hydrOXYzine (VISTARIL) 25 MG capsule Take 25 mg by mouth 4 (four) times daily as needed for anxiety. Patient not taking: Reported on 05/08/2020 12/26/18   [provider]  hyoscyamine (LEVSIN SL) 0.125 MG SL tablet Place 1 tablet (0.125 mg total) under the tongue every 6 (six) hours as needed (AS NEEDED FOR PAIN). Patient not taking: Reported on 05/08/2020 03/05/20   Cirigliano, Vito V, DO  omeprazole (PRILOSEC) 20 MG capsule Take 20 mg by mouth daily. Patient not taking: Reported on 05/29/2020    [provider]  oxybutynin (DITROPAN-XL) 5 MG 24 hr tablet TAKE 1 TABLET(5 MG) BY MOUTH AT BEDTIME Patient not taking: Reported on 05/08/2020 03/16/20   Wendie Agreste, MD  terazosin (HYTRIN) 2 MG capsule TAKE 1 CAPSULE BY MOUTH EVERY NIGHT AT BEDTIME Patient not taking: Reported on 05/08/2020 01/12/20   Wendie Agreste, MD   Social History   Socioeconomic History  . Marital status: Single     Spouse name: Not on file  . Number of children: 0  . Years of education: 43  . Highest education level: High school graduate  Occupational History  . Occupation: Disabled  Tobacco Use  . Smoking status: Current Every Day Smoker    Packs/day: 0.25    Types: Cigarettes  . Smokeless tobacco: Never Used  . Tobacco comment: 4  cigs per day 01/2019  Vaping Use  . Vaping Use: Former  . Start date: 06/27/2013  . Quit date: 06/14/2017  . Devices: Apple Cinnamon-0 mg  Substance and Sexual Activity  . Alcohol use: No  . Drug use: Not Currently    Comment: h/o IVD use  . Sexual activity: Not Currently  Other Topics Concern  . Not on file  Social History Narrative   Lives at home alone.   Right-handed.   Caffeine use: 4 cups coffee/soda   Social Determinants of Health   Financial Resource Strain:   . Difficulty of Paying Living Expenses: Not on file  Food Insecurity:   . Worried About Charity fundraiser in the Last Year: Not on file  . Ran Out of Food in the Last Year: Not on file  Transportation Needs:   . Lack of Transportation (Medical): Not on file  . Lack of Transportation (Non-Medical): Not on file  Physical Activity:   . Days of Exercise per Week: Not on file  . Minutes of Exercise per Session: Not on file  Stress:   . Feeling of Stress : Not on file  Social Connections:   . Frequency of Communication with Friends and Family: Not on file  . Frequency of Social Gatherings with Friends and Family: Not on file  . Attends Religious Services: Not on file  . Active Member of Clubs or Organizations: Not on file  . Attends Archivist Meetings: Not on file  . Marital Status: Not on file  Intimate Partner Violence:   . Fear of Current or Ex-Partner: Not on file  . Emotionally Abused: Not on file  . Physically Abused: Not on file  . Sexually Abused: Not on file    Review of Systems Per HPI.   Objective:   Vitals:   05/29/20 1119 05/29/20 1123 05/29/20 1126  BP:  132/67 124/64 104/69  Pulse: 62    Temp: 98 F (36.7 C)    TempSrc: Temporal    SpO2: 97%    Height: 5\' 2"  (1.575 m)       Physical Exam Vitals reviewed.  Constitutional:      Appearance: She is well-developed. She is obese.     Comments: Appears uncomfortable but nontoxic appearing, responding normally to questions.   HENT:     Head: Normocephalic and atraumatic.  Eyes:     Conjunctiva/sclera: Conjunctivae normal.     Pupils: Pupils are equal, round, and reactive to light.  Neck:     Vascular: No carotid bruit.  Cardiovascular:     Rate and Rhythm: Normal rate and regular rhythm.     Heart sounds: Normal heart sounds.     Comments: Reg rate, rhythm. Some reproduction of left sided chest pain. Reports worsening of pain with inspiration.  Pulmonary:     Effort: Pulmonary effort is normal.     Breath sounds: Normal breath sounds.  Abdominal:     General: Bowel sounds are normal.     Palpations: Abdomen is soft. There is no pulsatile mass.     Tenderness: There is no abdominal tenderness.  Skin:    General: Skin is warm and dry.  Neurological:     General: No focal deficit present.     Mental Status: She is alert and oriented to person, place, and time.  Psychiatric:        Behavior: Behavior normal.    EKG: Sinus bradycardia, rate 59.  No apparent significant changes when compared  to March 30, 2020 EKG no apparent acute findings.  Results for orders placed or performed in visit on 05/29/20  POCT CBC  Result Value Ref Range   WBC 6.5 4.6 - 10.2 K/uL   Lymph, poc 1.7 0.6 - 3.4   POC LYMPH PERCENT 25.6 10 - 50 %L   MID (cbc) 0.3 0 - 0.9   POC MID % 4.3 0 - 12 %M   POC Granulocyte 4.6 2 - 6.9   Granulocyte percent 70.1 37 - 80 %G   RBC 3.16 (A) 4.04 - 5.48 M/uL   Hemoglobin 8.9 (A) 11 - 14.6 g/dL   HCT, POC 27.4 (A) 29 - 41 %   MCV 86.8 76 - 111 fL   MCH, POC 28.1 27 - 31.2 pg   MCHC 32.4 31.8 - 35.4 g/dL   RDW, POC 19.4 %   Platelet Count, POC 282 142 - 424  K/uL   MPV 7.3 0 - 99.8 fL  POCT glucose (manual entry)  Result Value Ref Range   POC Glucose 98 70 - 99 mg/dl      Assessment & Plan:  Sherry [name] is a 64 y.o. female . Chest pain, unspecified type - Plan: EKG 12-Lead  Near syncope - Plan: POCT CBC, POCT glucose (manual entry)  Anemia due to chronic kidney disease, unspecified CKD stage - Plan: POCT CBC  History of chronic kidney disease with anemia, reportedly had visit yesterday with nephrologist and plan for iron infusion, unknown CBC.  Now with acute onset of left-sided chest pain approximately 2 hours ago, 7-8 out of 10 in intensity.  Radiation to left arm with associated diaphoresis and dyspnea.  Some reproducibility of chest pain but also pain with inspiration/pleuritic nature.  Associated near syncope/fatigue, 1 episode of diarrhea this morning without vomiting.  Further eval through ER.  EMS transport.  12:29 PM EMS called for transport.  She has not taken aspirin. Thinks she was told not to take in past at time of hernia?  12:54 PM EMS arrived, report given with transfer of care.    45 minutes spent during visit, greater than 50% counseling and assimilation of information, chart review, and discussion of plan, coordination and report of care with EMS.    No orders of the defined types were placed in this encounter.  Patient Instructions       If you have lab work done today you will be contacted with your lab results within the next 2 weeks.  If you have not heard from [place] then please contact us. The fastest way to get your results is to register for My Chart.   IF you received an x-ray today, you will receive an invoice from [facility]. Please contact [facility] at [phone] with questions or concerns regarding your invoice.   IF you received labwork today, you will receive an invoice from [facility]. Please contact [facility] at [phone] with questions or concerns regarding your invoice.    Our billing staff will not be able to assist you with questions regarding bills from these companies.  You will be contacted with the lab results as soon as they are available. The fastest way to get your results is to activate your My Chart account. Instructions are located on the last page of this paperwork. If you have not heard from [place] regarding the results in 2 weeks, please contact this office.         Signed, Merri Ray, MD [facility]  [facility]  

## 2020-05-29 NOTE — ED Notes (Signed)
Pt back from CT

## 2020-05-29 NOTE — ED Notes (Signed)
Pt transported to CT ?

## 2020-05-29 NOTE — H&P (Signed)
History and Physical   Sherry [name] SWN:[id number] DOB: September 09, 1955 DOA: 05/29/2020  PCP: Wendie Agreste, MD   Patient coming from: home  Chief Complaint: Near syncope, Chest pain  HPI: Sherry [name] is a 64 y.o. female with medical history significant of CKD III, CHFpEF, HTN, GERD, Substance Use, MDD, Bipolar who presents with chest pain and near syncope. Patients states that she began experiencing episodes of near syncope about 4 days ago when walking at a church event. The episodes of been intermittent. The typically occur when she get up to stand or walk. They consist of feeling light headed without actually passing out or collapsing. She states symptoms improve with sitting or lying down.   She additionally reports chest pain which began when going to see her PCP yesterday. The pain is along her sternum and is worse with deep inspiration or palpation. She states during the initial episodes she experienced diaphoresis and some SOB (possibly related to pain on inspiration). She state the pain radiates into her arm. She continues to report some chest pain worse with palpation. She denies wearing any ill fitting bras (pain is also located along bra line).   She denies fevers, chills, cough, constipation, diarrhea (does report 1 loose stool), or abdominal pain.    ED Course: Vital signs stable. Labs significant for Cr 2.74 (baseline 2.35), Alb 3.3, Hgb 8.7 (stabel from 8.9 yesterday but down from 10 two months ago). HsTrop negative x2, BNP 265, FOBT negative. CXR without acute abnormality, CT head w/o acute abnormality. She received pain medication, 500cc bolus, meclizine, and Zofran in ED.  Review of Systems: As per HPI otherwise all other systems reviewed and are negative.  Past Medical History:  Diagnosis Date  . Anxiety   . Bipolar and related disorder ([place])   . Blood transfusion without reported diagnosis   . Breast cancer ([place])    right lumpectemy and lymph node removal  . CHF  (congestive heart failure) ([place])   . Chronic back pain   . Chronic kidney disease    "Stage IV" - states due to hypertension  . Depression   . GERD (gastroesophageal reflux disease)   . Hepatitis C   . Hypertension   . Left-sided weakness   . MRSA infection    of breast incision  . Opioid dependence ([place])    Has been treated in [facility] in past  . Substance abuse ([place])   . TIA (transient ischemic attack) 2020   P/w BP >230/120 and neurologic symptoms, presumed TIA    Past Surgical History:  Procedure Laterality Date  . BACK SURGERY  1990  . BREAST SURGERY Right 2010   "breast cancer survivor" - states partial mastectomy and nodes  . COLONOSCOPY     [facility]  . ESOPHAGOGASTRODUODENOSCOPY     [facility]  . ESOPHAGOGASTRODUODENOSCOPY  04/18/2020   [facility]  . LAPAROSCOPIC CHOLECYSTECTOMY  2002  . LAPAROSCOPIC INCISIONAL / UMBILICAL / VENTRAL HERNIA REPAIR  04/13/2018   with BARD 15x 93GH mesh (supraumbilical)  . LAPAROSCOPIC LYSIS OF ADHESIONS  07/12/2018   Procedure: LAPAROSCOPIC LYSIS OF ADHESIONS;  Surgeon: Isabel Caprice, MD;  Location: [facility];  Service: Gynecology;;  . MYOMECTOMY     x 2 prior to hysterectomy  . ROBOTIC ASSISTED BILATERAL SALPINGO OOPHERECTOMY Right 07/12/2018   Procedure: XI ROBOTIC ASSISTED RIGHT SALPINGO OOPHORECTOMY;  Surgeon: Isabel Caprice, MD;  Location: [facility];  Service: Gynecology;  Laterality: Right;  . TOTAL ABDOMINAL HYSTERECTOMY  fibroids   . UPPER GASTROINTESTINAL ENDOSCOPY      Social History  reports that she has been smoking cigarettes. She has been smoking about 0.25 packs per day. She has never used smokeless tobacco. She reports previous drug use. She reports that she does not drink alcohol.  Allergies  Allergen Reactions  . Abilify [Aripiprazole] Other (See Comments)    Tardive dyskinesia Oral  . Remeron [Mirtazapine] Other (See Comments)    Wgt stimulation /gain, Dizziness, Patient says "can  tolerate"  . Trazodone And Nefazodone Other (See Comments)    Nightmares/sleep diturbance  . Flexeril [Cyclobenzaprine] Other (See Comments)    Pt states Flexeril makes her feel depressed   . Amoxicillin Diarrhea and Other (See Comments)    NOTE the patient has had PCN WITHOUT reaction Has patient had a PCN reaction causing immediate rash, facial/tongue/throat swelling, SOB or lightheadedness with hypotension: No Has patient had a PCN reaction causing severe rash involving mucus membranes or skin necrosis: No Has patient had a PCN reaction that required hospitalization: No Has patient had a PCN reaction occurring within the last 10 years: No If all of the above answers are "NO", then may proceed with Cephalosporin use.     Family History  Problem Relation Age of Onset  . Heart attack Mother   . Breast cancer Mother 55  . Dementia Mother   . Cancer Father 30       died of bleeding from kidneys  . Colon cancer Neg Hx   . Esophageal cancer Neg Hx   . Stomach cancer Neg Hx   . Rectal cancer Neg Hx   Reviewed on admission  Prior to Admission medications   Medication Sig Start Date End Date Taking? Authorizing Provider  amLODipine (NORVASC) 10 MG tablet Take 1 tablet (10 mg total) by mouth daily. 09/13/19  Yes Forrest Moron, MD  Cholecalciferol (VITAMIN D3) 50 MCG (2000 UT) capsule Take 2,000 Units by mouth daily.   Yes [provider]  FEROSUL 325 (65 Fe) MG tablet TAKE 1 TABLET BY MOUTH DAILY WITH BREAKFAST Patient taking differently: Take 325 mg by mouth daily with breakfast.  11/29/19  Yes Wendie Agreste, MD  FLUoxetine (PROZAC) 40 MG capsule Take 40 mg by mouth daily.  11/09/18  Yes [provider]  gabapentin (NEURONTIN) 800 MG tablet Take 800 mg by mouth 3 (three) times daily.   Yes [provider]  INGREZZA 80 MG CAPS Take 80 mg by mouth daily. 10/15/17  Yes [provider]  isosorbide mononitrate (IMDUR) 60 MG 24 hr tablet TAKE 1 TABLET BY  MOUTH DAILY Patient taking differently: Take 60 mg by mouth daily.  03/12/20  Yes Wendie Agreste, MD  labetalol (NORMODYNE) 300 MG tablet Take 300 mg by mouth 2 (two) times daily.   Yes [provider]  melatonin 5 MG TABS Take 5 mg by mouth at bedtime.   Yes [provider]  ondansetron (ZOFRAN) 4 MG tablet TAKE 1 TABLET BY MOUTH EVERY 8 HOURS AS NEEDED FOR NAUSEA OR VOMITING Patient taking differently: Take 4 mg by mouth every 8 (eight) hours as needed for nausea or vomiting.  01/12/20  Yes Wendie Agreste, MD  pantoprazole (PROTONIX) 20 MG tablet Take 1 tablet (20 mg total) by mouth 2 (two) times daily. 11/27/19  Yes Wendie Agreste, MD  atorvastatin (LIPITOR) 40 MG tablet TAKE 1 TABLET (40 MG TOTAL) BY MOUTH DAILY AT 6 PM. Patient not taking: Reported on  05/08/2020 03/12/20   Wendie Agreste, MD  hydrALAZINE (APRESOLINE) 25 MG tablet Take 1 tablet (25 mg total) by mouth 3 (three) times daily. Patient not taking: Reported on 05/29/2020 05/11/19   Edwin Dada, MD  labetalol (NORMODYNE) 200 MG tablet Take 1 tablet (200 mg total) by mouth 2 (two) times daily. Patient not taking: Reported on 05/29/2020 05/11/19   Edwin Dada, MD    Physical Exam: Vitals:   05/29/20 2230 05/29/20 2245 05/29/20 2300 05/29/20 2315  BP: (!) 152/59  (!) 157/67 (!) 157/67  Pulse: 78 72 63 73  Resp: 14 (!) 23 19 16   Temp:      TempSrc:      SpO2: 92% 95% 98% 97%  Weight:      Height:       Physical Exam Constitutional:      General: She is not in acute distress.    Appearance: Normal appearance. She is obese.  HENT:     Head: Normocephalic and atraumatic.     Mouth/Throat:     Mouth: Mucous membranes are moist.     Pharynx: Oropharynx is clear.  Eyes:     Extraocular Movements: Extraocular movements intact.     Pupils: Pupils are equal, round, and reactive to light.  Cardiovascular:     Rate and Rhythm: Normal rate and regular rhythm.     Pulses: Normal  pulses.     Heart sounds: Normal heart sounds.  Pulmonary:     Effort: Pulmonary effort is normal. No respiratory distress.     Breath sounds: Normal breath sounds.  Abdominal:     General: Bowel sounds are normal. There is no distension.     Palpations: Abdomen is soft.     Tenderness: There is no abdominal tenderness.  Musculoskeletal:        General: No swelling or deformity.     Comments: Chest wall tenderness, Parasternal and along bra line on left  Skin:    General: Skin is warm and dry.  Neurological:     General: No focal deficit present.     Mental Status: Mental status is at baseline.    Labs on Admission: I have personally reviewed following labs and imaging studies  CBC: Recent Labs  Lab 05/29/20 1223 05/29/20 1354  WBC 6.5 7.4  NEUTROABS  --  5.3  HGB 8.9* 8.7*  HCT 27.4* 28.8*  MCV 86.8 91.1  PLT  --  824    Basic Metabolic Panel: Recent Labs  Lab 05/29/20 1354  NA 140  K 4.7  CL 112*  CO2 18*  GLUCOSE 96  BUN 28*  CREATININE 2.73*  CALCIUM 9.0    GFR: Estimated Creatinine Clearance: 23.8 mL/min (A) (by C-G formula based on SCr of 2.73 mg/dL (H)).  Liver Function Tests: Recent Labs  Lab 05/29/20 1354  AST 11*  ALT 10  ALKPHOS 86  BILITOT 0.3  PROT 6.5  ALBUMIN 3.3*    Urine analysis:    Component Value Date/Time   COLORURINE STRAW (A) 05/29/2020 2057   APPEARANCEUR CLEAR 05/29/2020 2057   LABSPEC 1.012 05/29/2020 2057   PHURINE 5.0 05/29/2020 2057   GLUCOSEU NEGATIVE 05/29/2020 2057   HGBUR NEGATIVE 05/29/2020 2057   BILIRUBINUR NEGATIVE 05/29/2020 2057   KETONESUR NEGATIVE 05/29/2020 2057   PROTEINUR 100 (A) 05/29/2020 2057   UROBILINOGEN 0.2 01/12/2010 1941   NITRITE NEGATIVE 05/29/2020 2057   LEUKOCYTESUR NEGATIVE 05/29/2020 2057    Radiological Exams on Admission: CT Head  Wo Contrast  Result Date: 05/29/2020 CLINICAL DATA:  Dizziness, nonspecific. EXAM: CT HEAD WITHOUT CONTRAST TECHNIQUE: Contiguous axial images  were obtained from the base of the skull through the vertex without intravenous contrast. COMPARISON:  MRI/MRA head 05/09/2019. head CT 05/08/2019. FINDINGS: Brain: Cerebral volume is normal for age. Redemonstrated small chronic lacunar infarct within the left lentiform nucleus. Mild ill-defined hypoattenuation within the cerebral white matter is nonspecific, but compatible with chronic small vessel ischemic disease. There is no acute intracranial hemorrhage. No demarcated cortical infarct. No extra-axial fluid collection. No evidence of intracranial mass. No midline shift. Partially empty sella turcica. Vascular: No hyperdense vessel.  Atherosclerotic calcifications. Skull: Normal. Negative for fracture or focal lesion. Sinuses/Orbits: Visualized orbits show no acute finding. No significant paranasal sinus disease at the imaged levels. Small right mastoid effusion. IMPRESSION: No evidence of acute intracranial abnormality. Redemonstrated small chronic left basal ganglia lacunar infarct. Stable background mild cerebral white matter chronic small vessel ischemic disease. Partially empty sella turcica. This finding is very commonly incidental, but can be associated with idiopathic intracranial hypertension. Small right mastoid effusion. Electronically Signed   By: Kellie Simmering DO   On: 05/29/2020 16:17   DG Chest Portable 1 View  Result Date: 05/29/2020 CLINICAL DATA:  Chest pain EXAM: PORTABLE CHEST 1 VIEW COMPARISON:  05/08/2019 FINDINGS: Artifact overlies the chest. Mild cardiomegaly. Mediastinal shadows are normal. Allowing for technical factors, the lungs are clear. No infiltrate, edema, collapse or effusion. No acute bone finding. IMPRESSION: Mild cardiomegaly. No active disease. Electronically Signed   By: Nelson Chimes M.D.   On: 05/29/2020 15:03    EKG: Independently reviewed. Sinus rhythm.  Assessment/Plan Principal Problem:   Acute kidney injury superimposed on CKD (HCC) Active Problems:   Hx  of bipolar disorder   Chronic kidney disease, stage III (moderate) (HCC)   Essential hypertension   MDD (major depressive disorder), recurrent severe, without psychosis ([place])   Cigarette smoker   Dizziness   Chronic diastolic CHF (congestive heart failure) (HCC)   GERD (gastroesophageal reflux disease)   Near syncope   Chest pain, rule out acute myocardial infarction  Near Syncope > Recurrent episodes of lightheadedness / near syncope for the past 2 days > Story consistent with ortho stasis, initial orthostatic negative in ED - Observe on telemetry - Repeat orthostatics - Continue gentle IVF - Echocardiogram (last was about 1 year ago)  Chest Pain > Pain is likely MSK in nature give very TTP at chest wall, Negative troponin x2, non ischemic EKG > Patient requesting pain medicine but holding off as she has history of opioid use and pain consistent with chest wall pain - Voltaren gel for chest wall pain - Getting repeat Echo as above - Recheck EKG and Trop if change in pain or concerning features  AKI on CKD3 > Cr 2/785 from base of 2.35 - Received 500cc bolus in ED - Continue gentle IVF - Avoid nephro toxins - AM BMP  CHFpEF > BNP 265, but patient appear euvolemic / vol down and has CKD - Monitor I/O and Weight - Continue home Amlodipine, IMDUR and Labetalol  - Gentle IVF - Echo as above  HTN > BP stable in ED - Continue home Amlodipine, IMDUR and Labetalol   GERD - Continue PPI  MDD Hx of Bipolar disorder - Continue home Prozac and Igrezza  Tobacco Use - Nicotine patch  Hx Substance use (Crack cocaine, and Opioids)  DVT prophylaxis: Heparin  Code Status:   Full  Family  Communication:  None on Admission Disposition Plan:   Patient is from:     Anticipated DC to:  HOME  Anticipated DC date:  home  Anticipated DC barriers: none  Consults called:  none  Admission status:  Obs, telemetry   Severity of Illness: The appropriate patient status for this  patient is OBSERVATION. Observation status is judged to be reasonable and necessary in order to provide the required intensity of service to ensure the patient's safety. The patient's presenting symptoms, physical exam findings, and initial radiographic and laboratory data in the context of their medical condition is felt to place them at decreased risk for further clinical deterioration. Furthermore, it is anticipated that the patient will be medically stable for discharge from the hospital within 2 midnights of admission. The following factors support the patient status of observation.   " The patient's presenting symptoms include Chest pain, near syncope. " The physical exam findings include chest wall pain. " The initial radiographic and laboratory data are Cr elevated, hgb 8.7, Trop stable, BNP 265.   Marcelyn Bruins MD [facility]  How to contact the [facility] Attending or Consulting provider [place] or covering provider during after hours [place], for this patient?   1. Check the care team in [facility] and look for a) attending/consulting TR[facility] provider listed and b) the [facility] team listed 2. Log into [email or web address] and use [place]'s universal password to access. If you do not have the password, please contact the hospital operator. 3. Locate the [facility] provider you are looking for under [facility] and page to a number that you can be directly reached. 4. If you still have difficulty reaching the provider, please page the [facility] (Director on Call) for the Hospitalists listed on amion for assistance.  05/29/2020, 11:36 PM

## 2020-05-29 NOTE — ED Provider Notes (Signed)
Dizziness since Friday, had to be helped into [employer].  If sit still its fine but with movement she gets dizzy  Was reportedly orthostatic at PCP, chest pain into left arm, had diaphoresis with it.   Pain with pressing on chest, moving arm.    Physical Exam  BP (!) 164/83   Pulse 74   Temp 98.2 F (36.8 C) (Oral)   Resp 18   Ht 5\' 2"  (1.575 m) Comment: per record  Wt 105.7 kg   SpO2 97%   BMI 42.62 kg/m    Patient is awake and alert.  She has pain with movement, is constantly asking for pain medications.   Plan is delta trop, +/- MRI of brain If isn't able to walk with out ataxia.   1757: informed by RN that patient had refused delta troponin  1913: I went to evaluate patient after meclizine.  She reports that her dizziness has resolved and she feels fine other than the pain in her chest.  She asks me for tramadol or other pain medications.  So far in the ER she has had both fentanyl and morphine.  I do not think it is in her best interest to provide her with additional narcotic pain medications at this time.   Patient is no longer orthostatic.  Patient continues to have left sided chest pain that is not relieved by tylenol.    Patient states she does not feel that she can go home.   Hospitalist is consulted.   Note: Portions of this report may have been transcribed using voice recognition software. Every effort was made to ensure accuracy; however, inadvertent computerized transcription errors may be present       Ollen Gross 05/29/20 2226    Truddie Hidden, MD 05/29/20 2227

## 2020-05-30 ENCOUNTER — Observation Stay (HOSPITAL_COMMUNITY): Payer: Medicare HMO

## 2020-05-30 ENCOUNTER — Encounter (HOSPITAL_COMMUNITY): Payer: Self-pay | Admitting: Internal Medicine

## 2020-05-30 DIAGNOSIS — N184 Chronic kidney disease, stage 4 (severe): Secondary | ICD-10-CM

## 2020-05-30 DIAGNOSIS — R079 Chest pain, unspecified: Secondary | ICD-10-CM | POA: Diagnosis not present

## 2020-05-30 DIAGNOSIS — N179 Acute kidney failure, unspecified: Secondary | ICD-10-CM | POA: Diagnosis not present

## 2020-05-30 DIAGNOSIS — R55 Syncope and collapse: Secondary | ICD-10-CM | POA: Diagnosis not present

## 2020-05-30 DIAGNOSIS — N1832 Chronic kidney disease, stage 3b: Secondary | ICD-10-CM | POA: Diagnosis not present

## 2020-05-30 DIAGNOSIS — I5032 Chronic diastolic (congestive) heart failure: Secondary | ICD-10-CM

## 2020-05-30 DIAGNOSIS — I10 Essential (primary) hypertension: Secondary | ICD-10-CM

## 2020-05-30 DIAGNOSIS — Z8659 Personal history of other mental and behavioral disorders: Secondary | ICD-10-CM

## 2020-05-30 LAB — COMPREHENSIVE METABOLIC PANEL
ALT: 7 U/L (ref 0–44)
AST: 12 U/L — ABNORMAL LOW (ref 15–41)
Albumin: 3.2 g/dL — ABNORMAL LOW (ref 3.5–5.0)
Alkaline Phosphatase: 81 U/L (ref 38–126)
Anion gap: 11 (ref 5–15)
BUN: 28 mg/dL — ABNORMAL HIGH (ref 8–23)
CO2: 20 mmol/L — ABNORMAL LOW (ref 22–32)
Calcium: 9.3 mg/dL (ref 8.9–10.3)
Chloride: 111 mmol/L (ref 98–111)
Creatinine, Ser: 2.75 mg/dL — ABNORMAL HIGH (ref 0.44–1.00)
GFR, Estimated: 19 mL/min — ABNORMAL LOW (ref >60)
Glucose, Bld: 115 mg/dL — ABNORMAL HIGH (ref 70–99)
Potassium: 4.3 mmol/L (ref 3.5–5.1)
Sodium: 142 mmol/L (ref 135–145)
Total Bilirubin: 0.4 mg/dL (ref 0.3–1.2)
Total Protein: 6.5 g/dL (ref 6.5–8.1)

## 2020-05-30 LAB — CBC
HCT: 30 % — ABNORMAL LOW (ref 36.0–46.0)
Hemoglobin: 9.1 g/dL — ABNORMAL LOW (ref 12.0–15.0)
MCH: 27.4 pg (ref 26.0–34.0)
MCHC: 30.3 g/dL (ref 30.0–36.0)
MCV: 90.4 fL (ref 80.0–100.0)
Platelets: 262 10*3/uL (ref 150–400)
RBC: 3.32 MIL/uL — ABNORMAL LOW (ref 3.87–5.11)
RDW: 18.5 % — ABNORMAL HIGH (ref 11.5–15.5)
WBC: 6.8 10*3/uL (ref 4.0–10.5)
nRBC: 0 % (ref 0.0–0.2)

## 2020-05-30 LAB — RAPID URINE DRUG SCREEN, HOSP PERFORMED
Amphetamines: NOT DETECTED
Barbiturates: NOT DETECTED
Benzodiazepines: NOT DETECTED
Cocaine: NOT DETECTED
Opiates: POSITIVE — AB
Tetrahydrocannabinol: NOT DETECTED

## 2020-05-30 LAB — HIV ANTIBODY (ROUTINE TESTING W REFLEX): HIV Screen 4th Generation wRfx: NONREACTIVE

## 2020-05-30 LAB — ECHOCARDIOGRAM COMPLETE
Area-P 1/2: 3.72 cm2
Calc EF: 62.7 %
Height: 62 in
S' Lateral: 3 cm
Single Plane A2C EF: 62.5 %
Single Plane A4C EF: 63.9 %
Weight: 3681.6 oz

## 2020-05-30 LAB — GLUCOSE, CAPILLARY: Glucose-Capillary: 90 mg/dL (ref 70–99)

## 2020-05-30 LAB — IRON AND TIBC
Iron: 45 ug/dL (ref 28–170)
Saturation Ratios: 17 % (ref 10.4–31.8)
TIBC: 266 ug/dL (ref 250–450)
UIBC: 221 ug/dL

## 2020-05-30 LAB — VITAMIN B12: Vitamin B-12: 367 pg/mL (ref 180–914)

## 2020-05-30 LAB — FOLATE: Folate: 5.9 ng/mL — ABNORMAL LOW (ref >5.9)

## 2020-05-30 LAB — FERRITIN: Ferritin: 188 ng/mL (ref 11–307)

## 2020-05-30 MED ORDER — LIDOCAINE 5 % EX PTCH
1.0000 | MEDICATED_PATCH | Freq: Every day | CUTANEOUS | Status: DC
  Administered 2020-05-30 – 2020-05-31 (×3): 1 via TRANSDERMAL
  Filled 2020-05-30 (×3): qty 1

## 2020-05-30 MED ORDER — ALUM & MAG HYDROXIDE-SIMETH 200-200-20 MG/5ML PO SUSP: 15.0000 mL | ORAL | Status: DC | PRN

## 2020-05-30 MED ORDER — PANTOPRAZOLE SODIUM 40 MG IV SOLR
40.0000 mg | Freq: Two times a day (BID) | INTRAVENOUS | Status: DC
  Administered 2020-05-30 – 2020-06-01 (×5): 40 mg via INTRAVENOUS
  Filled 2020-05-30 (×5): qty 40

## 2020-05-30 MED ORDER — KETOROLAC TROMETHAMINE 15 MG/ML IJ SOLN
15.0000 mg | Freq: Four times a day (QID) | INTRAMUSCULAR | Status: DC | PRN
  Administered 2020-05-30 – 2020-06-01 (×6): 15 mg via INTRAVENOUS
  Filled 2020-05-30 (×6): qty 1

## 2020-05-30 MED ORDER — METHOCARBAMOL 500 MG PO TABS
500.0000 mg | ORAL_TABLET | Freq: Four times a day (QID) | ORAL | Status: DC | PRN
  Administered 2020-05-30 – 2020-05-31 (×2): 500 mg via ORAL
  Filled 2020-05-30 (×2): qty 1

## 2020-05-30 MED ORDER — TRAMADOL HCL 50 MG PO TABS
50.0000 mg | ORAL_TABLET | Freq: Two times a day (BID) | ORAL | Status: DC | PRN
  Administered 2020-05-30 – 2020-05-31 (×4): 50 mg via ORAL
  Filled 2020-05-30 (×4): qty 1

## 2020-05-30 MED ORDER — ALUM & MAG HYDROXIDE-SIMETH 200-200-20 MG/5ML PO SUSP
30.0000 mL | Freq: Once | ORAL | Status: DC | PRN
  Filled 2020-05-30: qty 30

## 2020-05-30 NOTE — Progress Notes (Addendum)
Patient ID: Sherry [name], female   DOB: 24-Dec-1955, 64 y.o.   MRN: [id number]  PROGRESS NOTE    Sherry [name]  KKX:[id number] DOB: 1956/07/07 DOA: 05/29/2020 PCP: Wendie Agreste, MD   Brief Narrative:  64 y.o. female with medical history significant of CKD III, CHFpEF, HTN, GERD, Substance abuse, MDD, Bipolar disorder presented with chest pain and near syncope.  On presentation, creatinine was 2.73, baseline of 2.35.  High-sensitivity troponins x2 were negative, BNP was 265.  Chest x-ray was without any acute abnormality.  CT of the head was negative for acute abnormality.  She was started on IV fluids.   Assessment & Plan:   Near syncope -Recurrent episodes of lightheadedness/near syncope for the last few days.  Initial orthostatic vitals negative in the ED. -Monitor orthostatic vitals. -Follow echo.  Continue gentle IV fluids.  Chest pain -Pain is atypical and located in the lower chest and upper abdomen.  Still complains of chest pain and now states that pain is radiating down her left arm and forearm. -Mildly tender to palpation in the lower chest wall and upper quadrant.  Troponins negative so far and EKG was nonischemic. -Patient has history of opiate abuse.  Patient keeps requesting for pain medication.  Continue Voltaren gel along with Tylenol.  Use tramadol as needed and IV Toradol if severe pain.  Follow echocardiogram. -Consult cardiology -Might have a component of GERD: Switch Protonix to 40 mg IV every 12 hours.  Add Maalox as needed.  Chronic diastolic heart failure -Currently appears euvolemic.  Strict input and output.  Daily weights.  Continue isosorbide mononitrate and labetalol.  Follow cardiology recommendations.  AKI on CKD stage IIIb -Baseline creatinine around 2.5.  Presented with creatinine of 2.73.  Creatinine at 2.75 today.  Increase normal saline to 100 cc an hour.  Monitor  Hypertension -Blood pressure still on the higher side.  Continue amlodipine, Imdur  and labetalol  GERD -Continue PPI  MDD History of bipolar disorder -Continue home Prozac and Ingrezza.  Outpatient follow-up with psychiatry  Morbid obesity -Outpatient follow-up  Tobacco abuse -Counseled on tobacco cessation.  Continue nicotine patch  History of substance abuse -History of crack cocaine and opiate use in the past.  Will check urine drug screen   DVT prophylaxis: Heparin Code Status: Full Family Communication: None at bedside Disposition Plan: Status is: Observation  The patient will require care spanning > 2 midnights and should be moved to inpatient because: Inpatient level of care appropriate due to severity of illness.  Still complaining of ongoing chest pain.  Dispo: The patient is from: Home              Anticipated d/c is to: Home              Anticipated d/c date is: 1 day              Patient currently is not medically stable to d/c.  Consultants: Consult cardiology  Procedures: Echo pending  Antimicrobials: None   Subjective: Patient seen and examined at bedside.  Still complains of chest pain radiating down her left arm and forearm.  Does not feel well to go home yet.  Denies worsening shortness of breath, nausea or vomiting.  No overnight fevers.  Objective: Vitals:   05/30/20 0114 05/30/20 0117 05/30/20 0434 05/30/20 0745  BP: (!) 183/83  140/68 (!) 157/76  Pulse: 73  (!) 56 71  Resp: 16  19 18   Temp: 98.1 F (36.7 C)  98.3  F (36.8 C) 98.6 F (37 C)  TempSrc: Oral  Oral Oral  SpO2: 100%  94% 94%  Weight:  104.4 kg 104.4 kg   Height:  5\' 2"  (1.575 m)      Intake/Output Summary (Last 24 hours) at 05/30/2020 1017 Last data filed at 05/30/2020 0554 Gross per 24 hour  Intake 249.35 ml  Output --  Net 249.35 ml   Filed Weights   05/29/20 1338 05/30/20 0117 05/30/20 0434  Weight: 105.7 kg 104.4 kg 104.4 kg    Examination:  General exam: Appears calm and comfortable.  Looks chronically ill.  Poor historian. Respiratory  system: Bilateral decreased breath sounds at bases with scattered crackles Cardiovascular system: S1 & S2 heard, intermittently bradycardic Gastrointestinal system: Abdomen is morbidly obese, nondistended, soft and mildly tender in the epigastric region.  Normal bowel sounds heard. Extremities: No cyanosis, clubbing; trace lower extremity edema Central nervous system: Alert and oriented. No focal neurological deficits. Moving extremities Skin: No rashes, lesions or ulcers Psychiatry: Looks intermittently anxious    Data Reviewed: I have personally reviewed following labs and imaging studies  CBC: Recent Labs  Lab 05/29/20 1223 05/29/20 1354 05/30/20 0244  WBC 6.5 7.4 6.8  NEUTROABS  --  5.3  --   HGB 8.9* 8.7* 9.1*  HCT 27.4* 28.8* 30.0*  MCV 86.8 91.1 90.4  PLT  --  255 563   Basic Metabolic Panel: Recent Labs  Lab 05/29/20 1354 05/30/20 0244  NA 140 142  K 4.7 4.3  CL 112* 111  CO2 18* 20*  GLUCOSE 96 115*  BUN 28* 28*  CREATININE 2.73* 2.75*  CALCIUM 9.0 9.3   GFR: Estimated Creatinine Clearance: 23.4 mL/min (A) (by C-G formula based on SCr of 2.75 mg/dL (H)). Liver Function Tests: Recent Labs  Lab 05/29/20 1354 05/30/20 0244  AST 11* 12*  ALT 10 7  ALKPHOS 86 81  BILITOT 0.3 0.4  PROT 6.5 6.5  ALBUMIN 3.3* 3.2*   No results for input(s): LIPASE, AMYLASE in the last 168 hours. No results for input(s): AMMONIA in the last 168 hours. Coagulation Profile: No results for input(s): INR, PROTIME in the last 168 hours. Cardiac Enzymes: No results for input(s): CKTOTAL, CKMB, CKMBINDEX, TROPONINI in the last 168 hours. BNP (last 3 results) No results for input(s): PROBNP in the last 8760 hours. HbA1C: No results for input(s): HGBA1C in the last 72 hours. CBG: Recent Labs  Lab 05/30/20 0828  GLUCAP 90   Lipid Profile: No results for input(s): CHOL, HDL, LDLCALC, TRIG, CHOLHDL, LDLDIRECT in the last 72 hours. Thyroid Function Tests: No results for  input(s): TSH, T4TOTAL, FREET4, T3FREE, THYROIDAB in the last 72 hours. Anemia Panel: Recent Labs    05/29/20 2320  VITAMINB12 367  FOLATE 5.9*  FERRITIN 188  TIBC 266  IRON 45  RETICCTPCT 2.0   Sepsis Labs: No results for input(s): PROCALCITON, LATICACIDVEN in the last 168 hours.  Recent Results (from the past 240 hour(s))  Respiratory Panel by RT PCR (Flu A&B, Covid) - Nasopharyngeal Swab     Status: None   Collection Time: 05/29/20 10:15 PM   Specimen: Nasopharyngeal Swab  Result Value Ref Range Status   SARS Coronavirus 2 by RT PCR NEGATIVE NEGATIVE Final    Comment: (NOTE) SARS-CoV-2 target nucleic acids are NOT DETECTED.  The SARS-CoV-2 RNA is generally detectable in upper respiratoy specimens during the acute phase of infection. The lowest concentration of SARS-CoV-2 viral copies this assay can detect is 131 copies/mL.  A negative result does not preclude SARS-Cov-2 infection and should not be used as the sole basis for treatment or other patient management decisions. A negative result may occur with  improper specimen collection/handling, submission of specimen other than nasopharyngeal swab, presence of viral mutation(s) within the areas targeted by this assay, and inadequate number of viral copies (<131 copies/mL). A negative result must be combined with clinical observations, patient history, and epidemiological information. The expected result is Negative.  Fact Sheet for Patients:  PinkCheek.be  Fact Sheet for Healthcare Providers:  GravelBags.it  This test is no t yet approved or cleared by the [place] FDA and  has been authorized for detection and/or diagnosis of SARS-CoV-2 by FDA under an Emergency Use Authorization (EUA). This EUA will remain  in effect (meaning this test can be used) for the duration of the COVID-19 declaration under Section 564(b)(1) of the Act, 21 U.S.C. section  360bbb-3(b)(1), unless the authorization is terminated or revoked sooner.     Influenza A by PCR NEGATIVE NEGATIVE Final   Influenza B by PCR NEGATIVE NEGATIVE Final    Comment: (NOTE) The Xpert Xpress SARS-CoV-2/FLU/RSV assay is intended as an aid in  the diagnosis of influenza from Nasopharyngeal swab specimens and  should not be used as a sole basis for treatment. Nasal washings and  aspirates are unacceptable for Xpert Xpress SARS-CoV-2/FLU/RSV  testing.  Fact Sheet for Patients: PinkCheek.be  Fact Sheet for Healthcare Providers: GravelBags.it  This test is not yet approved or cleared by the [place] FDA and  has been authorized for detection and/or diagnosis of SARS-CoV-2 by  FDA under an Emergency Use Authorization (EUA). This EUA will remain  in effect (meaning this test can be used) for the duration of the  Covid-19 declaration under Section 564(b)(1) of the Act, 21  U.S.C. section 360bbb-3(b)(1), unless the authorization is  terminated or revoked. Performed at [facility], [place] [address], [place], [place] [address]          Radiology Studies: CT Head Wo Contrast  Result Date: 05/29/2020 CLINICAL DATA:  Dizziness, nonspecific. EXAM: CT HEAD WITHOUT CONTRAST TECHNIQUE: Contiguous axial images were obtained from the base of the skull through the vertex without intravenous contrast. COMPARISON:  MRI/MRA head 05/09/2019. head CT 05/08/2019. FINDINGS: Brain: Cerebral volume is normal for age. Redemonstrated small chronic lacunar infarct within the left lentiform nucleus. Mild ill-defined hypoattenuation within the cerebral white matter is nonspecific, but compatible with chronic small vessel ischemic disease. There is no acute intracranial hemorrhage. No demarcated cortical infarct. No extra-axial fluid collection. No evidence of intracranial mass. No midline shift. Partially empty sella turcica. Vascular:  No hyperdense vessel.  Atherosclerotic calcifications. Skull: Normal. Negative for fracture or focal lesion. Sinuses/Orbits: Visualized orbits show no acute finding. No significant paranasal sinus disease at the imaged levels. Small right mastoid effusion. IMPRESSION: No evidence of acute intracranial abnormality. Redemonstrated small chronic left basal ganglia lacunar infarct. Stable background mild cerebral white matter chronic small vessel ischemic disease. Partially empty sella turcica. This finding is very commonly incidental, but can be associated with idiopathic intracranial hypertension. Small right mastoid effusion. Electronically Signed   By: Kellie Simmering DO   On: 05/29/2020 16:17   DG Chest Portable 1 View  Result Date: 05/29/2020 CLINICAL DATA:  Chest pain EXAM: PORTABLE CHEST 1 VIEW COMPARISON:  05/08/2019 FINDINGS: Artifact overlies the chest. Mild cardiomegaly. Mediastinal shadows are normal. Allowing for technical factors, the lungs are clear. No infiltrate, edema, collapse or  effusion. No acute bone finding. IMPRESSION: Mild cardiomegaly. No active disease. Electronically Signed   By: Nelson Chimes M.D.   On: 05/29/2020 15:03        Scheduled Meds: . amLODipine  10 mg Oral Daily  . diclofenac Sodium  2 g Topical QID  . ferrous sulfate  325 mg Oral Q breakfast  . FLUoxetine  40 mg Oral Daily  . gabapentin  300 mg Oral BID  . heparin  5,000 Units Subcutaneous Q8H  . isosorbide mononitrate  60 mg Oral Daily  . labetalol  300 mg Oral BID  . lidocaine  1 patch Transdermal QHS  . melatonin  5 mg Oral QHS  . nicotine  21 mg Transdermal Daily  . pantoprazole (PROTONIX) IV  40 mg Intravenous Q12H  . sodium chloride flush  3 mL Intravenous Q12H  . [START ON 05/31/2020] Valbenazine Tosylate  80 mg Oral Daily   Continuous Infusions: . sodium chloride 75 mL/hr at 05/30/20 0545          Aline August, MD [facility] 05/30/2020, 10:17 AM

## 2020-05-30 NOTE — Consult Note (Addendum)
Cardiology Consultation:   Patient ID: Sherry [name] MRN: [id number]; DOB: Aug 08, 1955  Admit date: 05/29/2020 Date of Consult: 05/30/2020  Primary Care Provider: Wendie Agreste, MD [facility] Cardiologist: Jenean Lindau, MD  [facility] Electrophysiologist:  None    Patient Profile:   Sherry [name] is a 64 y.o. female with a hx of HTN, chronic diastolic CHF, tobacco use, morbid obesity, CKD stage 4, history of substance abuse, TIA, carotid artery disease (1-39% b/l) who is being seen today for the evaluation of chest pain and syncope at the request of Dr. Starla Link.  History of Present Illness:   Sherry [name] has been followed by Dr. Geraldo Pitter for the above cardiac issues. The patient had a myoview lexiscan in 06/2018 showed LVEF 45-54%, no ischemia, low risk, and significant bradycardia with infusion of lexiscan. Echo 04/2019 showed LVEF 65-70%, mild LVH, elevated mean left atrial pressure, mildly dilated left atrium, The patient was last seen 05/29/19 and was symptomatically stable from a cardiac perspective.   The patient presented to the ER 05/29/20 for near syncope, chest pain and dizziness. The patient stated she started experiencing dizziness about a week ago. It could occur sitting or walking but was worse when she stood up. Also had lightheadedness. Also reported left sided chest pain that started at the same time. She felt this was worse when she stood up and walked. It is also very tender to palpation. It was sharp and throbbing in nature and radiating down the left arm. Never felt this pain before. Had some associated nausea and diaphoresis. She was at her PCPs office when she started feeling dizzy and had chest pain so EMS was called to bring her to the ED for evaluation.   In the ED BP 164/83, pulse 74, RR 18, 97%O2. Labs showed creatinine 2.73, BUN 28, Hgb 8.7. BNP 265. Hs troponin 5>6. EKG showed NSR with LVH and no acute changes. CT head showed no acute process and  possible idiopathic intracranial HTN. CXR showed mild cardiomegaly. She was given IVF, pain meds, meclizine, and zofran and was admitted.   Past Medical History:  Diagnosis Date  . Anxiety   . Bipolar and related disorder ([place])   . Blood transfusion without reported diagnosis   . Breast cancer ([place])    right lumpectemy and lymph node removal  . CHF (congestive heart failure) ([place])   . Chronic back pain   . Chronic kidney disease    "Stage IV" - states due to hypertension  . Depression   . Dyspnea   . GERD (gastroesophageal reflux disease)   . Hepatitis C   . Hypertension   . Left-sided weakness   . MRSA infection    of breast incision  . Opioid dependence ([place])    Has been treated in [facility] in past  . Substance abuse ([place])   . TIA (transient ischemic attack) 2020   P/w BP >230/120 and neurologic symptoms, presumed TIA    Past Surgical History:  Procedure Laterality Date  . BACK SURGERY  1990  . BREAST SURGERY Right 2010   "breast cancer survivor" - states partial mastectomy and nodes  . COLONOSCOPY     [facility]  . ESOPHAGOGASTRODUODENOSCOPY     [facility]  . ESOPHAGOGASTRODUODENOSCOPY  04/18/2020   [facility]  . LAPAROSCOPIC CHOLECYSTECTOMY  2002  . LAPAROSCOPIC INCISIONAL / UMBILICAL / VENTRAL HERNIA REPAIR  04/13/2018   with BARD 15x 22WL mesh (supraumbilical)  . LAPAROSCOPIC LYSIS OF ADHESIONS  07/12/2018   Procedure: LAPAROSCOPIC LYSIS OF ADHESIONS;  Surgeon: Isabel Caprice, MD;  Location: [facility];  Service: Gynecology;;  . MYOMECTOMY     x 2 prior to hysterectomy  . ROBOTIC ASSISTED BILATERAL SALPINGO OOPHERECTOMY Right 07/12/2018   Procedure: XI ROBOTIC ASSISTED RIGHT SALPINGO OOPHORECTOMY;  Surgeon: Isabel Caprice, MD;  Location: [facility];  Service: Gynecology;  Laterality: Right;  . TOTAL ABDOMINAL HYSTERECTOMY     fibroids   . UPPER GASTROINTESTINAL ENDOSCOPY       Home Medications:  Prior to Admission medications     Medication Sig Start Date End Date Taking? Authorizing Provider  amLODipine (NORVASC) 10 MG tablet Take 1 tablet (10 mg total) by mouth daily. 09/13/19  Yes Forrest Moron, MD  Cholecalciferol (VITAMIN D3) 50 MCG (2000 UT) capsule Take 2,000 Units by mouth daily.   Yes [provider]  FEROSUL 325 (65 Fe) MG tablet TAKE 1 TABLET BY MOUTH DAILY WITH BREAKFAST Patient taking differently: Take 325 mg by mouth daily with breakfast.  11/29/19  Yes Wendie Agreste, MD  FLUoxetine (PROZAC) 40 MG capsule Take 40 mg by mouth daily.  11/09/18  Yes [provider]  gabapentin (NEURONTIN) 800 MG tablet Take 800 mg by mouth 3 (three) times daily.   Yes [provider]  INGREZZA 80 MG CAPS Take 80 mg by mouth daily. 10/15/17  Yes [provider]  isosorbide mononitrate (IMDUR) 60 MG 24 hr tablet TAKE 1 TABLET BY MOUTH DAILY Patient taking differently: Take 60 mg by mouth daily.  03/12/20  Yes Wendie Agreste, MD  labetalol (NORMODYNE) 300 MG tablet Take 300 mg by mouth 2 (two) times daily.   Yes [provider]  melatonin 5 MG TABS Take 5 mg by mouth at bedtime.   Yes [provider]  ondansetron (ZOFRAN) 4 MG tablet TAKE 1 TABLET BY MOUTH EVERY 8 HOURS AS NEEDED FOR NAUSEA OR VOMITING Patient taking differently: Take 4 mg by mouth every 8 (eight) hours as needed for nausea or vomiting.  01/12/20  Yes Wendie Agreste, MD  pantoprazole (PROTONIX) 20 MG tablet Take 1 tablet (20 mg total) by mouth 2 (two) times daily. 11/27/19  Yes Wendie Agreste, MD  atorvastatin (LIPITOR) 40 MG tablet TAKE 1 TABLET (40 MG TOTAL) BY MOUTH DAILY AT 6 PM. Patient not taking: Reported on 05/08/2020 03/12/20   Wendie Agreste, MD  hydrALAZINE (APRESOLINE) 25 MG tablet Take 1 tablet (25 mg total) by mouth 3 (three) times daily. Patient not taking: Reported on 05/29/2020 05/11/19   Edwin Dada, MD  labetalol (NORMODYNE) 200 MG tablet Take 1 tablet (200 mg total) by  mouth 2 (two) times daily. Patient not taking: Reported on 05/29/2020 05/11/19   Edwin Dada, MD    Inpatient Medications: Scheduled Meds: . amLODipine  10 mg Oral Daily  . diclofenac Sodium  2 g Topical QID  . ferrous sulfate  325 mg Oral Q breakfast  . FLUoxetine  40 mg Oral Daily  . gabapentin  300 mg Oral BID  . heparin  5,000 Units Subcutaneous Q8H  . isosorbide mononitrate  60 mg Oral Daily  . labetalol  300 mg Oral BID  . lidocaine  1 patch Transdermal QHS  . melatonin  5 mg Oral QHS  . nicotine  21 mg Transdermal Daily  . pantoprazole (PROTONIX) IV  40 mg Intravenous Q12H  . sodium chloride flush  3 mL Intravenous Q12H  . Derrill Memo  ON 05/31/2020] Valbenazine Tosylate  80 mg Oral Daily   Continuous Infusions: . sodium chloride 75 mL/hr at 05/30/20 0545   PRN Meds: acetaminophen **OR** acetaminophen, alum & mag hydroxide-simeth, ketorolac, ondansetron, traMADol  Allergies:    Allergies  Allergen Reactions  . Abilify [Aripiprazole] Other (See Comments)    Tardive dyskinesia Oral  . Remeron [Mirtazapine] Other (See Comments)    Wgt stimulation /gain, Dizziness, Patient says "can tolerate"  . Trazodone And Nefazodone Other (See Comments)    Nightmares/sleep diturbance  . Flexeril [Cyclobenzaprine] Other (See Comments)    Pt states Flexeril makes her feel depressed   . Amoxicillin Diarrhea and Other (See Comments)    NOTE the patient has had PCN WITHOUT reaction Has patient had a PCN reaction causing immediate rash, facial/tongue/throat swelling, SOB or lightheadedness with hypotension: No Has patient had a PCN reaction causing severe rash involving mucus membranes or skin necrosis: No Has patient had a PCN reaction that required hospitalization: No Has patient had a PCN reaction occurring within the last 10 years: No If all of the above answers are "NO", then may proceed with Cephalosporin use.     Social History:   Social History   Socioeconomic History    . Marital status: Single    Spouse name: Not on file  . Number of children: 0  . Years of education: 35  . Highest education level: High school graduate  Occupational History  . Occupation: Disabled  Tobacco Use  . Smoking status: Current Every Day Smoker    Packs/day: 1.00    Types: Cigarettes  . Smokeless tobacco: Never Used  Vaping Use  . Vaping Use: Former  . Start date: 06/27/2013  . Quit date: 06/14/2017  . Devices: Apple Cinnamon-0 mg  Substance and Sexual Activity  . Alcohol use: No  . Drug use: Not Currently    Comment: h/o IVD use  . Sexual activity: Not Currently  Other Topics Concern  . Not on file  Social History Narrative   Lives at home alone.   Right-handed.   Caffeine use: 4 cups coffee/soda   Social Determinants of Health   Financial Resource Strain:   . Difficulty of Paying Living Expenses: Not on file  Food Insecurity:   . Worried About Charity fundraiser in the Last Year: Not on file  . Ran Out of Food in the Last Year: Not on file  Transportation Needs:   . Lack of Transportation (Medical): Not on file  . Lack of Transportation (Non-Medical): Not on file  Physical Activity:   . Days of Exercise per Week: Not on file  . Minutes of Exercise per Session: Not on file  Stress:   . Feeling of Stress : Not on file  Social Connections:   . Frequency of Communication with Friends and Family: Not on file  . Frequency of Social Gatherings with Friends and Family: Not on file  . Attends Religious Services: Not on file  . Active Member of Clubs or Organizations: Not on file  . Attends Archivist Meetings: Not on file  . Marital Status: Not on file  Intimate Partner Violence:   . Fear of Current or Ex-Partner: Not on file  . Emotionally Abused: Not on file  . Physically Abused: Not on file  . Sexually Abused: Not on file    Family History:   Family History  Problem Relation Age of Onset  . Heart attack Mother   . Breast cancer Mother 10   .  Dementia Mother   . Cancer Father 62       died of bleeding from kidneys  . Colon cancer Neg Hx   . Esophageal cancer Neg Hx   . Stomach cancer Neg Hx   . Rectal cancer Neg Hx      ROS:  Please see the history of present illness.   All other ROS reviewed and negative.     Physical Exam/Data:   Vitals:   05/30/20 0114 05/30/20 0117 05/30/20 0434 05/30/20 0745  BP: (!) 183/83  140/68 (!) 157/76  Pulse: 73  (!) 56 71  Resp: 16  19 18   Temp: 98.1 F (36.7 C)  98.3 F (36.8 C) 98.6 F (37 C)  TempSrc: Oral  Oral Oral  SpO2: 100%  94% 94%  Weight:  104.4 kg 104.4 kg   Height:  5\' 2"  (1.575 m)      Intake/Output Summary (Last 24 hours) at 05/30/2020 1021 Last data filed at 05/30/2020 0554 Gross per 24 hour  Intake 249.35 ml  Output --  Net 249.35 ml   Last 3 Weights 05/30/2020 05/30/2020 05/29/2020  Weight (lbs) 230 lb 1.6 oz 230 lb 1.6 oz 233 lb  Weight (kg) 104.373 kg 104.373 kg 105.688 kg  Some encounter information is confidential and restricted. Go to Review Flowsheets activity to see all data.     Body mass index is 42.09 kg/m.  General:  Well nourished, well developed, in no acute distress HEENT: normal Lymph: no adenopathy Neck: no JVD Endocrine:  No thryomegaly Vascular: No carotid bruits; FA pulses 2+ bilaterally without bruits  Cardiac:  normal S1, S2; RRR; no murmur; TTP Lungs:  clear to auscultation bilaterally, no wheezing, rhonchi or rales  Abd: soft, nontender, no hepatomegaly  Ext: no edema Musculoskeletal:  No deformities, BUE and BLE strength normal and equal Skin: warm and dry  Neuro:  CNs 2-12 intact, no focal abnormalities noted Psych:  Normal affect   EKG:  The EKG was personally reviewed and demonstrates:  NSR LVH, nonspecific ST/T wave changes Telemetry:  Telemetry was personally reviewed and demonstrates:  NSR, HR 60-70  Relevant CV Studies:  Echo 04/2019 1. Left ventricular ejection fraction, by visual estimation, is 65 to   70%. The left ventricle has hyperdynamic function. Normal left ventricular  size. There is mildly increased left ventricular hypertrophy.  2. Elevated mean left atrial pressure.  3. Left ventricular diastolic Doppler parameters are consistent with  pseudonormalization pattern of LV diastolic filling.  4. Global right ventricle has normal systolic function.The right  ventricular size is normal. No increase in right ventricular wall  thickness.  5. Left atrial size was mildly dilated.  6. Right atrial size was normal.  7. The mitral valve is normal in structure. No evidence of mitral valve  regurgitation.  8. The tricuspid valve is normal in structure. Tricuspid valve  regurgitation was not visualized by color flow Doppler.  9. The aortic valve is normal in structure. Aortic valve regurgitation  was not visualized by color flow Doppler.  10. The pulmonic valve was not well visualized. Pulmonic valve  regurgitation is trivial by color flow Doppler.  11. TR signal is inadequate for assessing pulmonary artery systolic  pressure.   Myoview 06/2018 Study Highlights   Nuclear stress EF: 52%.  The left ventricular ejection fraction is mildly decreased (45-54%).  There was no ST segment deviation noted during stress.  The study is normal.  This is a low risk study.  Breast attenuation  artifact noted.  Significant bradycardia with the infusion of Lexiscan.       Laboratory Data:  High Sensitivity Troponin:   Recent Labs  Lab 05/29/20 1354 05/29/20 1820  TROPONINIHS 5 6     Chemistry Recent Labs  Lab 05/29/20 1354 05/30/20 0244  NA 140 142  K 4.7 4.3  CL 112* 111  CO2 18* 20*  GLUCOSE 96 115*  BUN 28* 28*  CREATININE 2.73* 2.75*  CALCIUM 9.0 9.3  GFRNONAA 19* 19*  ANIONGAP 10 11    Recent Labs  Lab 05/29/20 1354 05/30/20 0244  PROT 6.5 6.5  ALBUMIN 3.3* 3.2*  AST 11* 12*  ALT 10 7  ALKPHOS 86 81  BILITOT 0.3 0.4   Hematology Recent Labs   Lab 05/29/20 1223 05/29/20 1354 05/29/20 2320 05/30/20 0244  WBC 6.5 7.4  --  6.8  RBC 3.16* 3.16* 3.18* 3.32*  HGB 8.9* 8.7*  --  9.1*  HCT 27.4* 28.8*  --  30.0*  MCV 86.8 91.1  --  90.4  MCH 28.1 27.5  --  27.4  MCHC 32.4 30.2  --  30.3  RDW  --  18.5*  --  18.5*  PLT  --  255  --  262   BNP Recent Labs  Lab 05/29/20 1354  BNP 265.7*    DDimer No results for input(s): DDIMER in the last 168 hours.   Radiology/Studies:  CT Head Wo Contrast  Result Date: 05/29/2020 CLINICAL DATA:  Dizziness, nonspecific. EXAM: CT HEAD WITHOUT CONTRAST TECHNIQUE: Contiguous axial images were obtained from the base of the skull through the vertex without intravenous contrast. COMPARISON:  MRI/MRA head 05/09/2019. head CT 05/08/2019. FINDINGS: Brain: Cerebral volume is normal for age. Redemonstrated small chronic lacunar infarct within the left lentiform nucleus. Mild ill-defined hypoattenuation within the cerebral white matter is nonspecific, but compatible with chronic small vessel ischemic disease. There is no acute intracranial hemorrhage. No demarcated cortical infarct. No extra-axial fluid collection. No evidence of intracranial mass. No midline shift. Partially empty sella turcica. Vascular: No hyperdense vessel.  Atherosclerotic calcifications. Skull: Normal. Negative for fracture or focal lesion. Sinuses/Orbits: Visualized orbits show no acute finding. No significant paranasal sinus disease at the imaged levels. Small right mastoid effusion. IMPRESSION: No evidence of acute intracranial abnormality. Redemonstrated small chronic left basal ganglia lacunar infarct. Stable background mild cerebral white matter chronic small vessel ischemic disease. Partially empty sella turcica. This finding is very commonly incidental, but can be associated with idiopathic intracranial hypertension. Small right mastoid effusion. Electronically Signed   By: Kellie Simmering DO   On: 05/29/2020 16:17   DG Chest  Portable 1 View  Result Date: 05/29/2020 CLINICAL DATA:  Chest pain EXAM: PORTABLE CHEST 1 VIEW COMPARISON:  05/08/2019 FINDINGS: Artifact overlies the chest. Mild cardiomegaly. Mediastinal shadows are normal. Allowing for technical factors, the lungs are clear. No infiltrate, edema, collapse or effusion. No acute bone finding. IMPRESSION: Mild cardiomegaly. No active disease. Electronically Signed   By: Nelson Chimes M.D.   On: 05/29/2020 15:03     Assessment and Plan:   Near syncope/dizziness - dizziness sounds orthostatic however orthostatics negative in the ER - CT head with no acute process - Echo ordered - IVF - BPS elevated - can consider heart monitor at discharge  Atypical Chest pain - HS trop negative x 2 - EKG without acute changes - Patient is tender to palpation on exam - Patient has a history of opiate abuse and keeps asking for pain  meds - Lexiscan in 2019 was normal, low risk with no ischemia - Echo ordered>>pending results - If echo is abnormal can consider stress test however pain sounds MSK in nature  Chronic diastolic CHF - euvolemic on exam - continue BB  AKI on CKD stage 4 - creatinine on admission 2.73 with baseline of 2.5 - IVF and creatinine today 2.75  HTN - amlodipine 10mg , Imdur 60mg  daily, labetalol 300mg  BID - pressures elevated - can increase Imdur  Substance abuse - tobacco use - hx of crack cocaine>denies recent use - UDS ordered   For questions or updates, please contact [facility] Please consult [email or web address] for contact info under    Signed, Cadence Arlyss Repress  05/30/2020 10:21 AM   Patient seen and examined and agree with Cadence Kathlen Mody, PA-C as detailed above.  In brief, the patient is a a 64 y.o. female with a hx of HTN, chronic diastolic CHF, tobacco use, morbid obesity, CKD stage 4, history of substance abuse, TIA, carotid artery disease (1-39% b/l) who presented with chest pain and near syncope.  Patient states  that she has significant lightheadedness/dizziness with standing. Also notes left sided chest and arm pain that came on at the same time. Pain is reproducible with palpation of her chest.   Trops negative. ECG without ischemic changes.  Myoview lexiscan in 06/2018 showed LVEF 45-54%, no ischemia, low risk. TTE 04/2019 showed LVEF 65-70%, mild LVH, elevated mean left atrial pressure, mildly dilated left atrium.  Given reassuring story and work-up thus far do not suspect cardiac etiology of chest pain. Her pre-syncopal symptoms sound orthostatic in nature although she does report some palpitations. No further cardiac work-up needed as in-patient and recommended orthostatic precautions. Can plan on close Cardiology follow-up with out-patient heart monitor at discharge.  Exam: GEN: No acute distress.   Neck: No JVD Cardiac: RRR, no murmurs, rubs, or gallops. Very tender to palpation along the left chest Respiratory: Clear to auscultation bilaterally. GI: Soft, nontender, non-distended  MS: No edema; No deformity. Neuro:  Nonfocal  Psych: Normal affect   Plan: -Do not suspect cardiac etiology of chest pain as very tender to palpation of left chest -Trop negative, ECG without ischemia, myoview 2019 normal -Follow-up TTE-->per my preliminary review, normal BiV function, mild LVH -Recommend orthostatic precautions -Will arrange for out-patient cardiac event monitor at discharge given palpitations as well as Cardiology follow-up    Cardiology will sign-off. Please feel free to call with questions or concerns.   Gwyndolyn Kaufman, MD

## 2020-05-30 NOTE — Progress Notes (Signed)
  Echocardiogram 2D Echocardiogram has been performed.  Michiel Cowboy 05/30/2020, 11:39 AM

## 2020-05-30 NOTE — Plan of Care (Signed)

## 2020-05-31 ENCOUNTER — Ambulatory Visit (HOSPITAL_COMMUNITY): Payer: Self-pay | Admitting: Behavioral Health

## 2020-05-31 DIAGNOSIS — Z6841 Body Mass Index (BMI) 40.0 and over, adult: Secondary | ICD-10-CM | POA: Diagnosis not present

## 2020-05-31 DIAGNOSIS — F332 Major depressive disorder, recurrent severe without psychotic features: Secondary | ICD-10-CM

## 2020-05-31 DIAGNOSIS — F419 Anxiety disorder, unspecified: Secondary | ICD-10-CM | POA: Diagnosis present

## 2020-05-31 DIAGNOSIS — R42 Dizziness and giddiness: Secondary | ICD-10-CM | POA: Diagnosis present

## 2020-05-31 DIAGNOSIS — Z8249 Family history of ischemic heart disease and other diseases of the circulatory system: Secondary | ICD-10-CM | POA: Diagnosis not present

## 2020-05-31 DIAGNOSIS — Z803 Family history of malignant neoplasm of breast: Secondary | ICD-10-CM | POA: Diagnosis not present

## 2020-05-31 DIAGNOSIS — N179 Acute kidney failure, unspecified: Secondary | ICD-10-CM | POA: Diagnosis not present

## 2020-05-31 DIAGNOSIS — R079 Chest pain, unspecified: Secondary | ICD-10-CM | POA: Diagnosis present

## 2020-05-31 DIAGNOSIS — G8929 Other chronic pain: Secondary | ICD-10-CM | POA: Diagnosis present

## 2020-05-31 DIAGNOSIS — R0789 Other chest pain: Secondary | ICD-10-CM | POA: Diagnosis present

## 2020-05-31 DIAGNOSIS — I130 Hypertensive heart and chronic kidney disease with heart failure and stage 1 through stage 4 chronic kidney disease, or unspecified chronic kidney disease: Secondary | ICD-10-CM | POA: Diagnosis present

## 2020-05-31 DIAGNOSIS — Z9049 Acquired absence of other specified parts of digestive tract: Secondary | ICD-10-CM | POA: Diagnosis not present

## 2020-05-31 DIAGNOSIS — R55 Syncope and collapse: Secondary | ICD-10-CM | POA: Diagnosis present

## 2020-05-31 DIAGNOSIS — N184 Chronic kidney disease, stage 4 (severe): Secondary | ICD-10-CM | POA: Diagnosis present

## 2020-05-31 DIAGNOSIS — Z853 Personal history of malignant neoplasm of breast: Secondary | ICD-10-CM | POA: Diagnosis not present

## 2020-05-31 DIAGNOSIS — Z8673 Personal history of transient ischemic attack (TIA), and cerebral infarction without residual deficits: Secondary | ICD-10-CM | POA: Diagnosis not present

## 2020-05-31 DIAGNOSIS — I5032 Chronic diastolic (congestive) heart failure: Secondary | ICD-10-CM | POA: Diagnosis not present

## 2020-05-31 DIAGNOSIS — E872 Acidosis: Secondary | ICD-10-CM | POA: Diagnosis present

## 2020-05-31 DIAGNOSIS — F17210 Nicotine dependence, cigarettes, uncomplicated: Secondary | ICD-10-CM | POA: Diagnosis present

## 2020-05-31 DIAGNOSIS — K219 Gastro-esophageal reflux disease without esophagitis: Secondary | ICD-10-CM | POA: Diagnosis present

## 2020-05-31 DIAGNOSIS — Z20822 Contact with and (suspected) exposure to covid-19: Secondary | ICD-10-CM | POA: Diagnosis present

## 2020-05-31 DIAGNOSIS — Z8614 Personal history of Methicillin resistant Staphylococcus aureus infection: Secondary | ICD-10-CM | POA: Diagnosis not present

## 2020-05-31 DIAGNOSIS — M549 Dorsalgia, unspecified: Secondary | ICD-10-CM | POA: Diagnosis present

## 2020-05-31 DIAGNOSIS — N1832 Chronic kidney disease, stage 3b: Secondary | ICD-10-CM | POA: Diagnosis not present

## 2020-05-31 DIAGNOSIS — F319 Bipolar disorder, unspecified: Secondary | ICD-10-CM | POA: Diagnosis present

## 2020-05-31 DIAGNOSIS — Z90710 Acquired absence of both cervix and uterus: Secondary | ICD-10-CM | POA: Diagnosis not present

## 2020-05-31 HISTORY — DX: Chest pain, unspecified: R07.9

## 2020-05-31 LAB — CBC WITH DIFFERENTIAL/PLATELET
Abs Immature Granulocytes: 0.03 10*3/uL (ref 0.00–0.07)
Basophils Absolute: 0 10*3/uL (ref 0.0–0.1)
Basophils Relative: 0 %
Eosinophils Absolute: 0.1 10*3/uL (ref 0.0–0.5)
Eosinophils Relative: 2 %
HCT: 25.8 % — ABNORMAL LOW (ref 36.0–46.0)
Hemoglobin: 8 g/dL — ABNORMAL LOW (ref 12.0–15.0)
Immature Granulocytes: 0 %
Lymphocytes Relative: 25 %
Lymphs Abs: 1.8 10*3/uL (ref 0.7–4.0)
MCH: 28.4 pg (ref 26.0–34.0)
MCHC: 31 g/dL (ref 30.0–36.0)
MCV: 91.5 fL (ref 80.0–100.0)
Monocytes Absolute: 0.6 10*3/uL (ref 0.1–1.0)
Monocytes Relative: 8 %
Neutro Abs: 4.4 10*3/uL (ref 1.7–7.7)
Neutrophils Relative %: 65 %
Platelets: 238 10*3/uL (ref 150–400)
RBC: 2.82 MIL/uL — ABNORMAL LOW (ref 3.87–5.11)
RDW: 18.3 % — ABNORMAL HIGH (ref 11.5–15.5)
WBC: 6.9 10*3/uL (ref 4.0–10.5)
nRBC: 0 % (ref 0.0–0.2)

## 2020-05-31 LAB — COMPREHENSIVE METABOLIC PANEL
ALT: 10 U/L (ref 0–44)
AST: 12 U/L — ABNORMAL LOW (ref 15–41)
Albumin: 3.1 g/dL — ABNORMAL LOW (ref 3.5–5.0)
Alkaline Phosphatase: 72 U/L (ref 38–126)
Anion gap: 12 (ref 5–15)
BUN: 36 mg/dL — ABNORMAL HIGH (ref 8–23)
CO2: 17 mmol/L — ABNORMAL LOW (ref 22–32)
Calcium: 8.9 mg/dL (ref 8.9–10.3)
Chloride: 112 mmol/L — ABNORMAL HIGH (ref 98–111)
Creatinine, Ser: 2.86 mg/dL — ABNORMAL HIGH (ref 0.44–1.00)
GFR, Estimated: 18 mL/min — ABNORMAL LOW (ref >60)
Glucose, Bld: 86 mg/dL (ref 70–99)
Potassium: 5 mmol/L (ref 3.5–5.1)
Sodium: 141 mmol/L (ref 135–145)
Total Bilirubin: 0.4 mg/dL (ref 0.3–1.2)
Total Protein: 6.2 g/dL — ABNORMAL LOW (ref 6.5–8.1)

## 2020-05-31 LAB — GLUCOSE, CAPILLARY: Glucose-Capillary: 85 mg/dL (ref 70–99)

## 2020-05-31 LAB — LIPASE, BLOOD: Lipase: 50 U/L (ref 11–51)

## 2020-05-31 LAB — MAGNESIUM: Magnesium: 1.8 mg/dL (ref 1.7–2.4)

## 2020-05-31 MED ORDER — MECLIZINE HCL 25 MG PO TABS: 25.0000 mg | ORAL_TABLET | Freq: Three times a day (TID) | ORAL | Status: DC | PRN

## 2020-05-31 MED ORDER — SODIUM BICARBONATE 8.4 % IV SOLN
INTRAVENOUS | Status: DC
  Filled 2020-05-31 (×4): qty 850

## 2020-05-31 NOTE — Care Management Obs Status (Signed)
[facility] NOTIFICATION   Patient Details  Name: Shanikqua Zarzycki MRN: [id number] Date of Birth: 10/25/1955   Medicare Observation Status Notification Given:  Yes    Bethena Roys, RN 05/31/2020, 9:10 AM

## 2020-05-31 NOTE — Progress Notes (Signed)
Patient ID: Sherry [name], female   DOB: 05/18/1956, 64 y.o.   MRN: [id number]  PROGRESS NOTE    Sherry [name]  LKG:[id number] DOB: 1955-08-29 DOA: 05/29/2020 PCP: Wendie Agreste, MD   Brief Narrative:  64 y.o. female with medical history significant of CKD III, CHFpEF, HTN, GERD, Substance abuse, MDD, Bipolar disorder presented with chest pain and near syncope.  On presentation, creatinine was 2.73, baseline of 2.35.  High-sensitivity troponins x2 were negative, BNP was 265.  Chest x-ray was without any acute abnormality.  CT of the head was negative for acute abnormality.  She was started on IV fluids.  Cardiology was also consulted.   Assessment & Plan:   Near syncope -Recurrent episodes of lightheadedness/near syncope for the last few days.  Initial orthostatic vitals negative in the ED. -Monitor orthostatic vitals. -Echo showed EF of 60 to 65% with no regional wall motion abnormalities with grade 2 diastolic dysfunction. -IV fluid plan as below -Patient still feels dizzy and not ready for discharge yet.  Chest pain -Pain is atypical and located in the lower chest and upper abdomen.  Still complains of chest pain and now states that pain is radiating down her left arm and forearm. -Mildly tender to palpation in the lower chest wall and upper quadrant.  Troponins negative so far and EKG was nonischemic. -Patient has history of opiate abuse.  Patient keeps requesting for opiate medication.  Continue Voltaren gel along with Tylenol.  Use tramadol as needed and IV Toradol if severe pain.  Echo as above. -Cardiology evaluation appreciated: Cardiology does not suspect cardiac etiology of chest pain.  Cardiology was arranged for outpatient cardiac event monitor at discharge given history of palpitations.  Cardiology has signed off. -Might have a component of GERD: Continue IV Protonix for now along with as needed Maalox.  Chronic diastolic heart failure -Currently appears euvolemic.  Strict  input and output.  Daily weights.  Continue isosorbide mononitrate and labetalol.  Outpatient follow-up with cardiology  AKI on CKD stage IIIb Acute metabolic acidosis -Baseline creatinine around 2.5.  Presented with creatinine of 2.73.  Creatinine at 2.86 today.  Switch normal saline to bicarb drip for the next 24 hours.  Repeat a.m. creatinine.  Hypertension -Blood pressure still on the higher side.  Continue amlodipine, Imdur and labetalol  GERD -Continue PPI  MDD History of bipolar disorder -Continue home Prozac and Ingrezza.  Outpatient follow-up with psychiatry  Morbid obesity -Outpatient follow-up  Tobacco abuse -Counseled on tobacco cessation.  Continue nicotine patch  History of substance abuse -History of crack cocaine and opiate use in the past.     DVT prophylaxis: Heparin Code Status: Full Family Communication: None at bedside Disposition Plan: Status is: Switch to inpatient :Inpatient level of care appropriate due to severity of illness.  Still complaining of ongoing chest pain with dizziness.  Dispo: The patient is from: Home              Anticipated d/c is to: Home              Anticipated d/c date is: 1 day              Patient currently is not medically stable to d/c.  Consultants: cardiology  Procedures: Echo as below  Antimicrobials: None   Subjective: Patient seen and examined at bedside.  Does not feel well today as well and still complains of lower chest and upper abdominal pain along with weakness and dizziness.  Does not feel  ready to go home today yet.  No overnight fever or vomiting reported.  Keeps asking why she can not get opiate medication.  Objective: Vitals:   05/30/20 2200 05/31/20 0550 05/31/20 0903 05/31/20 0928  BP: (!) 175/76 (!) 170/87 (!) 182/83 (!) 184/78  Pulse:  73    Resp:  19    Temp:  98.3 F (36.8 C)    TempSrc:  Oral    SpO2:  97%    Weight:  106.6 kg    Height:        Intake/Output Summary (Last 24 hours) at  05/31/2020 0959 Last data filed at 05/31/2020 0908 Gross per 24 hour  Intake 2996.33 ml  Output 1250 ml  Net 1746.33 ml   Filed Weights   05/30/20 0117 05/30/20 0434 05/31/20 0550  Weight: 104.4 kg 104.4 kg 106.6 kg    Examination:  General exam: No distress.  Looks chronically ill.  Poor historian. Respiratory system: Decreased breath sounds at bases bilaterally with some scattered crackles, no wheezing  cardiovascular system: Currently rate controlled, S1-S2 heard Gastrointestinal system: Abdomen is morbidly obese, nondistended, soft still mildly tender in the epigastric region.  Bowel sounds are heard  extremities: Mild lower extremity edema present.  No clubbing Central nervous system: Awake and alert.  Poor historian.  No focal neurological deficits.  Moves extremities Skin: No obvious ecchymosis/rashes Psychiatry: Anxious looking    Data Reviewed: I have personally reviewed following labs and imaging studies  CBC: Recent Labs  Lab 05/29/20 1223 05/29/20 1354 05/30/20 0244 05/31/20 0510  WBC 6.5 7.4 6.8 6.9  NEUTROABS  --  5.3  --  4.4  HGB 8.9* 8.7* 9.1* 8.0*  HCT 27.4* 28.8* 30.0* 25.8*  MCV 86.8 91.1 90.4 91.5  PLT  --  255 262 956   Basic Metabolic Panel: Recent Labs  Lab 05/29/20 1354 05/30/20 0244 05/31/20 0231  NA 140 142 141  K 4.7 4.3 5.0  CL 112* 111 112*  CO2 18* 20* 17*  GLUCOSE 96 115* 86  BUN 28* 28* 36*  CREATININE 2.73* 2.75* 2.86*  CALCIUM 9.0 9.3 8.9  MG  --   --  1.8   GFR: Estimated Creatinine Clearance: 22.8 mL/min (A) (by C-G formula based on SCr of 2.86 mg/dL (H)). Liver Function Tests: Recent Labs  Lab 05/29/20 1354 05/30/20 0244 05/31/20 0231  AST 11* 12* 12*  ALT 10 7 10   ALKPHOS 86 81 72  BILITOT 0.3 0.4 0.4  PROT 6.5 6.5 6.2*  ALBUMIN 3.3* 3.2* 3.1*   Recent Labs  Lab 05/31/20 0231  LIPASE 50   No results for input(s): AMMONIA in the last 168 hours. Coagulation Profile: No results for input(s): INR,  PROTIME in the last 168 hours. Cardiac Enzymes: No results for input(s): CKTOTAL, CKMB, CKMBINDEX, TROPONINI in the last 168 hours. BNP (last 3 results) No results for input(s): PROBNP in the last 8760 hours. HbA1C: No results for input(s): HGBA1C in the last 72 hours. CBG: Recent Labs  Lab 05/30/20 0828 05/31/20 0819  GLUCAP 90 85   Lipid Profile: No results for input(s): CHOL, HDL, LDLCALC, TRIG, CHOLHDL, LDLDIRECT in the last 72 hours. Thyroid Function Tests: No results for input(s): TSH, T4TOTAL, FREET4, T3FREE, THYROIDAB in the last 72 hours. Anemia Panel: Recent Labs    05/29/20 2320  VITAMINB12 367  FOLATE 5.9*  FERRITIN 188  TIBC 266  IRON 45  RETICCTPCT 2.0   Sepsis Labs: No results for input(s): PROCALCITON, LATICACIDVEN in the last  168 hours.  Recent Results (from the past 240 hour(s))  Respiratory Panel by RT PCR (Flu A&B, Covid) - Nasopharyngeal Swab     Status: None   Collection Time: 05/29/20 10:15 PM   Specimen: Nasopharyngeal Swab  Result Value Ref Range Status   SARS Coronavirus 2 by RT PCR NEGATIVE NEGATIVE Final    Comment: (NOTE) SARS-CoV-2 target nucleic acids are NOT DETECTED.  The SARS-CoV-2 RNA is generally detectable in upper respiratoy specimens during the acute phase of infection. The lowest concentration of SARS-CoV-2 viral copies this assay can detect is 131 copies/mL. A negative result does not preclude SARS-Cov-2 infection and should not be used as the sole basis for treatment or other patient management decisions. A negative result may occur with  improper specimen collection/handling, submission of specimen other than nasopharyngeal swab, presence of viral mutation(s) within the areas targeted by this assay, and inadequate number of viral copies (<131 copies/mL). A negative result must be combined with clinical observations, patient history, and epidemiological information. The expected result is Negative.  Fact Sheet for  Patients:  PinkCheek.be  Fact Sheet for Healthcare Providers:  GravelBags.it  This test is no t yet approved or cleared by the [place] FDA and  has been authorized for detection and/or diagnosis of SARS-CoV-2 by FDA under an Emergency Use Authorization (EUA). This EUA will remain  in effect (meaning this test can be used) for the duration of the COVID-19 declaration under Section 564(b)(1) of the Act, 21 U.S.C. section 360bbb-3(b)(1), unless the authorization is terminated or revoked sooner.     Influenza A by PCR NEGATIVE NEGATIVE Final   Influenza B by PCR NEGATIVE NEGATIVE Final    Comment: (NOTE) The Xpert Xpress SARS-CoV-2/FLU/RSV assay is intended as an aid in  the diagnosis of influenza from Nasopharyngeal swab specimens and  should not be used as a sole basis for treatment. Nasal washings and  aspirates are unacceptable for Xpert Xpress SARS-CoV-2/FLU/RSV  testing.  Fact Sheet for Patients: PinkCheek.be  Fact Sheet for Healthcare Providers: GravelBags.it  This test is not yet approved or cleared by the [place] FDA and  has been authorized for detection and/or diagnosis of SARS-CoV-2 by  FDA under an Emergency Use Authorization (EUA). This EUA will remain  in effect (meaning this test can be used) for the duration of the  Covid-19 declaration under Section 564(b)(1) of the Act, 21  U.S.C. section 360bbb-3(b)(1), unless the authorization is  terminated or revoked. Performed at [facility], [place] [address] [place], [place] [address]          Radiology Studies: CT Head Wo Contrast  Result Date: 05/29/2020 CLINICAL DATA:  Dizziness, nonspecific. EXAM: CT HEAD WITHOUT CONTRAST TECHNIQUE: Contiguous axial images were obtained from the base of the skull through the vertex without intravenous contrast. COMPARISON:  MRI/MRA head  05/09/2019. head CT 05/08/2019. FINDINGS: Brain: Cerebral volume is normal for age. Redemonstrated small chronic lacunar infarct within the left lentiform nucleus. Mild ill-defined hypoattenuation within the cerebral white matter is nonspecific, but compatible with chronic small vessel ischemic disease. There is no acute intracranial hemorrhage. No demarcated cortical infarct. No extra-axial fluid collection. No evidence of intracranial mass. No midline shift. Partially empty sella turcica. Vascular: No hyperdense vessel.  Atherosclerotic calcifications. Skull: Normal. Negative for fracture or focal lesion. Sinuses/Orbits: Visualized orbits show no acute finding. No significant paranasal sinus disease at the imaged levels. Small right mastoid effusion. IMPRESSION: No evidence of acute intracranial abnormality. Redemonstrated small chronic  left basal ganglia lacunar infarct. Stable background mild cerebral white matter chronic small vessel ischemic disease. Partially empty sella turcica. This finding is very commonly incidental, but can be associated with idiopathic intracranial hypertension. Small right mastoid effusion. Electronically Signed   By: Kellie Simmering DO   On: 05/29/2020 16:17   DG Chest Portable 1 View  Result Date: 05/29/2020 CLINICAL DATA:  Chest pain EXAM: PORTABLE CHEST 1 VIEW COMPARISON:  05/08/2019 FINDINGS: Artifact overlies the chest. Mild cardiomegaly. Mediastinal shadows are normal. Allowing for technical factors, the lungs are clear. No infiltrate, edema, collapse or effusion. No acute bone finding. IMPRESSION: Mild cardiomegaly. No active disease. Electronically Signed   By: Nelson Chimes M.D.   On: 05/29/2020 15:03   ECHOCARDIOGRAM COMPLETE  Result Date: 05/30/2020    ECHOCARDIOGRAM REPORT   Patient Name:   Sherry [name] Date of Exam: 05/30/2020 Medical Rec #:  [id number]    Height:       62.0 in Accession #:    [id number]   Weight:       230.1 lb Date of Birth:  [birth year]    BSA:           2.029 m Patient Age:    34 years     BP:           157/76 mmHg Patient Gender: F            HR:           61 bpm. Exam Location:  Inpatient Procedure: 2D Echo, Cardiac Doppler and Color Doppler Indications:    Chest Pain 786.50 / R07.9  History:        Patient has prior history of Echocardiogram examinations, most                 recent 05/09/2019. CHF, TIA, Signs/Symptoms:Dyspnea; Risk                 Factors:Hypertension and Current Smoker. Breast cancer. GERD.  Sonographer:    Vickie Epley RDCS Referring Phys: [id number] [place]  1. Left ventricular ejection fraction, by estimation, is 60 to 65%. The left ventricle has normal function. The left ventricle has no regional wall motion abnormalities. There is mild left ventricular hypertrophy. Left ventricular diastolic parameters are consistent with Grade II diastolic dysfunction (pseudonormalization). Elevated left atrial pressure.  2. Right ventricular systolic function is normal. The right ventricular size is normal. Tricuspid regurgitation signal is inadequate for assessing PA pressure.  3. Left atrial size was mildly dilated.  4. The mitral valve is abnormal. No evidence of mitral valve regurgitation. Moderate mitral annular calcification.  5. The aortic valve is tricuspid. Aortic valve regurgitation is not visualized. No aortic stenosis is present.  6. The inferior vena cava is normal in size with greater than 50% respiratory variability, suggesting right atrial pressure of 3 mmHg. FINDINGS  Left Ventricle: Left ventricular ejection fraction, by estimation, is 60 to 65%. The left ventricle has normal function. The left ventricle has no regional wall motion abnormalities. The left ventricular internal cavity size was normal in size. There is  mild left ventricular hypertrophy. Left ventricular diastolic parameters are consistent with Grade II diastolic dysfunction (pseudonormalization). Elevated left atrial pressure. Right Ventricle: The  right ventricular size is normal. Right vetricular wall thickness was not assessed. Right ventricular systolic function is normal. Tricuspid regurgitation signal is inadequate for assessing PA pressure. Left Atrium: Left atrial size was mildly dilated. Right Atrium: Right atrial size was normal  in size. Pericardium: Trivial pericardial effusion is present. Mitral Valve: The mitral valve is abnormal. Moderate mitral annular calcification. No evidence of mitral valve regurgitation. Tricuspid Valve: The tricuspid valve is normal in structure. Tricuspid valve regurgitation is trivial. Aortic Valve: The aortic valve is tricuspid. Aortic valve regurgitation is not visualized. No aortic stenosis is present. Pulmonic Valve: The pulmonic valve was not well visualized. Pulmonic valve regurgitation is trivial. Aorta: The aortic root is normal in size and structure. Venous: The inferior vena cava is normal in size with greater than 50% respiratory variability, suggesting right atrial pressure of 3 mmHg. IAS/Shunts: The interatrial septum was not well visualized.  LEFT VENTRICLE PLAX 2D LVIDd:         4.40 cm      Diastology LVIDs:         3.00 cm      LV e' medial:    5.76 cm/s LV PW:         0.90 cm      LV E/e' medial:  18.2 LV IVS:        0.90 cm      LV e' lateral:   7.14 cm/s LVOT diam:     2.00 cm      LV E/e' lateral: 14.7 LV SV:         78 LV SV Index:   39 LVOT Area:     3.14 cm  LV Volumes (MOD) LV vol d, MOD A2C: 135.0 ml LV vol d, MOD A4C: 139.0 ml LV vol s, MOD A2C: 50.6 ml LV vol s, MOD A4C: 50.2 ml LV SV MOD A2C:     84.4 ml LV SV MOD A4C:     139.0 ml LV SV MOD BP:      90.4 ml RIGHT VENTRICLE RV S prime:     14.30 cm/s TAPSE (M-mode): 2.4 cm LEFT ATRIUM             Index       RIGHT ATRIUM           Index LA diam:        4.20 cm 2.07 cm/m  RA Area:     13.10 cm LA Vol (A2C):   82.2 ml 40.51 ml/m RA Volume:   30.10 ml  14.83 ml/m LA Vol (A4C):   61.3 ml 30.21 ml/m LA Biplane Vol: 75.6 ml 37.26 ml/m   AORTIC VALVE LVOT Vmax:   111.00 cm/s LVOT Vmean:  72.600 cm/s LVOT VTI:    0.249 m  AORTA Ao Root diam: 3.30 cm MITRAL VALVE MV Area (PHT): 3.72 cm     SHUNTS MV Decel Time: 204 msec     Systemic VTI:  0.25 m MV E velocity: 105.00 cm/s  Systemic Diam: 2.00 cm MV A velocity: 96.30 cm/s MV E/A ratio:  1.09 Oswaldo Milian MD Electronically signed by Oswaldo Milian MD Signature Date/Time: 05/30/2020/3:06:56 PM    Final         Scheduled Meds: . amLODipine  10 mg Oral Daily  . diclofenac Sodium  2 g Topical QID  . ferrous sulfate  325 mg Oral Q breakfast  . FLUoxetine  40 mg Oral Daily  . gabapentin  300 mg Oral BID  . heparin  5,000 Units Subcutaneous Q8H  . isosorbide mononitrate  60 mg Oral Daily  . labetalol  300 mg Oral BID  . lidocaine  1 patch Transdermal QHS  . melatonin  5 mg Oral QHS  . nicotine  21 mg Transdermal Daily  . pantoprazole (PROTONIX) IV  40 mg Intravenous Q12H  . sodium chloride flush  3 mL Intravenous Q12H  . Valbenazine Tosylate  80 mg Oral Daily   Continuous Infusions: . sodium chloride 100 mL/hr at 05/31/20 0430          Aline August, MD [facility] 05/31/2020, 9:59 AM

## 2020-05-31 NOTE — Discharge Instructions (Signed)

## 2020-05-31 NOTE — Plan of Care (Signed)

## 2020-05-31 NOTE — Progress Notes (Signed)
PT Cancellation Note  Patient Details Name: Sherry [name] MRN: [id number] DOB: 1956-03-31   Cancelled Treatment:    Reason Eval/Treat Not Completed: Patient declined, no reason specified she adamantly refuses OOB activity with PT, stating she just got back from walking in the hallway. Unable to convince her to even participate in low level activities at the moment. Will attempt to return if time/schedule allow, otherwise will be back on next date of service.    Windell Norfolk, DPT, PN1   Supplemental Physical Therapist [facility]    Pager [phone] Acute Rehab Office [phone]

## 2020-06-01 LAB — CBC WITH DIFFERENTIAL/PLATELET
Abs Immature Granulocytes: 0.05 10*3/uL (ref 0.00–0.07)
Basophils Absolute: 0 10*3/uL (ref 0.0–0.1)
Basophils Relative: 0 %
Eosinophils Absolute: 0.1 10*3/uL (ref 0.0–0.5)
Eosinophils Relative: 2 %
HCT: 25.8 % — ABNORMAL LOW (ref 36.0–46.0)
Hemoglobin: 8 g/dL — ABNORMAL LOW (ref 12.0–15.0)
Immature Granulocytes: 1 %
Lymphocytes Relative: 22 %
Lymphs Abs: 1.5 10*3/uL (ref 0.7–4.0)
MCH: 27.6 pg (ref 26.0–34.0)
MCHC: 31 g/dL (ref 30.0–36.0)
MCV: 89 fL (ref 80.0–100.0)
Monocytes Absolute: 0.5 10*3/uL (ref 0.1–1.0)
Monocytes Relative: 7 %
Neutro Abs: 4.6 10*3/uL (ref 1.7–7.7)
Neutrophils Relative %: 68 %
Platelets: 228 10*3/uL (ref 150–400)
RBC: 2.9 MIL/uL — ABNORMAL LOW (ref 3.87–5.11)
RDW: 18.2 % — ABNORMAL HIGH (ref 11.5–15.5)
WBC: 6.7 10*3/uL (ref 4.0–10.5)
nRBC: 0 % (ref 0.0–0.2)

## 2020-06-01 LAB — BASIC METABOLIC PANEL
Anion gap: 9 (ref 5–15)
BUN: 31 mg/dL — ABNORMAL HIGH (ref 8–23)
CO2: 23 mmol/L (ref 22–32)
Calcium: 8.9 mg/dL (ref 8.9–10.3)
Chloride: 108 mmol/L (ref 98–111)
Creatinine, Ser: 2.56 mg/dL — ABNORMAL HIGH (ref 0.44–1.00)
GFR, Estimated: 20 mL/min — ABNORMAL LOW (ref >60)
Glucose, Bld: 86 mg/dL (ref 70–99)
Potassium: 4.7 mmol/L (ref 3.5–5.1)
Sodium: 140 mmol/L (ref 135–145)

## 2020-06-01 LAB — GLUCOSE, CAPILLARY: Glucose-Capillary: 88 mg/dL (ref 70–99)

## 2020-06-01 LAB — MAGNESIUM: Magnesium: 1.7 mg/dL (ref 1.7–2.4)

## 2020-06-01 MED ORDER — MECLIZINE HCL 25 MG PO TABS: 25.0000 mg | ORAL_TABLET | Freq: Three times a day (TID) | ORAL | 0 refills | Status: DC | PRN

## 2020-06-01 MED ORDER — GABAPENTIN 800 MG PO TABS
400.0000 mg | ORAL_TABLET | Freq: Three times a day (TID) | ORAL | Status: DC
Start: 2020-06-01 — End: 2020-07-30

## 2020-06-01 MED ORDER — LIDOCAINE 5 % EX PTCH: 1.0000 | MEDICATED_PATCH | Freq: Every day | CUTANEOUS | 0 refills | Status: DC

## 2020-06-01 MED ORDER — PANTOPRAZOLE SODIUM 20 MG PO TBEC: 20.0000 mg | DELAYED_RELEASE_TABLET | Freq: Two times a day (BID) | ORAL | 0 refills | Status: DC

## 2020-06-01 MED ORDER — ALUM & MAG HYDROXIDE-SIMETH 200-200-20 MG/5ML PO SUSP: 15.0000 mL | ORAL | 0 refills | Status: DC | PRN

## 2020-06-01 MED ORDER — METHOCARBAMOL 500 MG PO TABS: 500.0000 mg | ORAL_TABLET | Freq: Four times a day (QID) | ORAL | 0 refills | Status: DC | PRN

## 2020-06-01 MED ORDER — TRAMADOL HCL 50 MG PO TABS: 50.0000 mg | ORAL_TABLET | Freq: Two times a day (BID) | ORAL | 0 refills | Status: DC | PRN

## 2020-06-01 NOTE — TOC Progression Note (Signed)
Transition of Care [facility]) - Progression Note    Patient Details  Name: Sherry [name] MRN: [id number] Date of Birth: [birth year]  Transition of Care [facility]) CM/SW [place], [place] Phone Number: 06/01/2020, 2:03 PM  Clinical Narrative:     Patient will DC to: Home  Anticipated DC date: 11/20/21021  Transport by: Larence Penning Transportation  ?  Per MD patient ready for DC to home . RN and patient notified of DC. RN given number for report.  [facility] transportation requested for patient.  CSW signing off.     Barriers to Discharge: No Barriers Identified  Expected Discharge Plan and Services           Expected Discharge Date: 06/01/20                         [facility] Arranged: PT   Date [place]: 06/01/20 Time [place]: 1237 Representative spoke with at [place]: [place] ([place]) Interventions    Readmission Risk Interventions No flowsheet data found.

## 2020-06-01 NOTE — Discharge Instructions (Signed)
Chest Wall Pain Chest wall pain is pain in or around the bones and muscles of your chest. Chest wall pain may be caused by:  An injury.  Coughing a lot.  Using your chest and arm muscles too much. Sometimes, the cause may not be known. This pain may take a few weeks or longer to get better. Follow these instructions at home: Managing pain, stiffness, and swelling If told, put ice on the painful area:  Put ice in a plastic bag.  Place a towel between your skin and the bag.  Leave the ice on for 20 minutes, 2-3 times a day.  Activity  Rest as told by your doctor.  Avoid doing things that cause pain. This includes lifting heavy items.  Ask your doctor what activities are safe for you. General instructions   Take over-the-counter and prescription medicines only as told by your doctor.  Do not use any products that contain nicotine or tobacco, such as cigarettes, e-cigarettes, and chewing tobacco. If you need help quitting, ask your doctor.  Keep all follow-up visits as told by your doctor. This is important. Contact a doctor if:  You have a fever.  Your chest pain gets worse.  You have new symptoms. Get help right away if:  You feel sick to your stomach (nauseous) or you throw up (vomit).  You feel sweaty or light-headed.  You have a cough with mucus from your lungs (sputum) or you cough up blood.  You are short of breath. These symptoms may be an emergency. Do not wait to see if the symptoms will go away. Get medical help right away. Call your local emergency services (911 in the [place] Do not drive yourself to the hospital. Summary  Chest wall pain is pain in or around the bones and muscles of your chest.  It may be treated with ice, rest, and medicines. Your condition may also get better if you avoid doing things that cause pain.  Contact a doctor if you have a fever, chest pain that gets worse, or new symptoms.  Get help right away if you feel  light-headed or you get short of breath. These symptoms may be an emergency. This information is not intended to replace advice given to you by your health care provider. Make sure you discuss any questions you have with your health care provider. Document Revised: 12/30/2017 Document Reviewed: 12/30/2017 Elsevier Patient Education  [place]. Syncope Syncope is when you pass out (faint) for a short time. It is caused by a sudden decrease in blood flow to the brain. Signs that you may be about to pass out include:  Feeling dizzy or light-headed.  Feeling sick to your stomach (nauseous).  Seeing all white or all black.  Having cold, clammy skin. If you pass out, get help right away. Call your local emergency services (911 in the [place] Do not drive yourself to the hospital. Follow these instructions at home: Watch for any changes in your symptoms. Take these actions to stay safe and help with your symptoms: Lifestyle  Do not drive, use machinery, or play sports until your doctor says it is okay.  Do not drink alcohol.  Do not use any products that contain nicotine or tobacco, such as cigarettes and e-cigarettes. If you need help quitting, ask your doctor.  Drink enough fluid to keep your pee (urine) pale yellow. General instructions  Take over-the-counter and prescription medicines only as told by your doctor.  If you are  taking blood pressure or heart medicine, sit up and stand up slowly. Spend a few minutes getting ready to sit and then stand. This can help you feel less dizzy.  Have someone stay with you until you feel stable.  If you start to feel like you might pass out, lie down right away and raise (elevate) your feet above the level of your heart. Breathe deeply and steadily. Wait until all of the symptoms are gone.  Keep all follow-up visits as told by your doctor. This is important. Get help right away if:  You have a very bad headache.  You pass out once or  more than once.  You have pain in your chest, belly, or back.  You have a very fast or uneven heartbeat (palpitations).  It hurts to breathe.  You are bleeding from your mouth or your bottom (rectum).  You have black or tarry poop (stool).  You have jerky movements that you cannot control (seizure).  You are confused.  You have trouble walking.  You are very weak.  You have vision problems. These symptoms may be an emergency. Do not wait to see if the symptoms will go away. Get medical help right away. Call your local emergency services (911 in the [place] Do not drive yourself to the hospital. Summary  Syncope is when you pass out (faint) for a short time. It is caused by a sudden decrease in blood flow to the brain.  Signs that you may be about to faint include feeling dizzy, light-headed, or sick to your stomach, seeing all white or all black, or having cold, clammy skin.  If you start to feel like you might pass out, lie down right away and raise (elevate) your feet above the level of your heart. Breathe deeply and steadily. Wait until all of the symptoms are gone. This information is not intended to replace advice given to you by your health care provider. Make sure you discuss any questions you have with your health care provider. Document Revised: 08/11/2017 Document Reviewed: 08/11/2017 Elsevier Patient Education  2020 [employer].

## 2020-06-01 NOTE — Plan of Care (Signed)

## 2020-06-01 NOTE — Evaluation (Signed)
Physical Therapy Evaluation Patient Details Name: Sherry [name] MRN: [id number] DOB: 15-Aug-1955 Today's Date: 06/01/2020   History of Present Illness  Pt is a 64 y.o. F with significant PMH of CKD III, CHFpEF, HTN, substance abuse, MDD, bipolar disorder who presents with chest pain and near syncope.  Clinical Impression  Prior to admission, pt lives alone, uses a cane for mobility, and requires assist for ADL's/IADL's. Pt has a PCA 5 days/wk, 2 hours/day. Pt reporting upper abdominal and left shoulder pain and dizziness. Systolic BP dropped with transition to standing, but negative for orthostatic hypotension (see below). Pt ambulating 60 feet with a walker at a supervision level.  Recommend follow up HHPT to address deficits and maximize functional mobility.   Vitals (BP): Supine: 182/80 (105) Sitting: 187/88 (117) Standing: 172/87    Follow Up Recommendations Home health PT;Supervision - Intermittent    Equipment Recommendations  None recommended by PT    Recommendations for Other Services       Precautions / Restrictions Precautions Precautions: Fall Restrictions Weight Bearing Restrictions: No      Mobility  Bed Mobility Overal bed mobility: Independent                  Transfers Overall transfer level: Needs assistance Equipment used: None Transfers: Sit to/from Stand Sit to Stand: Supervision            Ambulation/Gait Ambulation/Gait assistance: Supervision Gait Distance (Feet): 60 Feet Assistive device: Rolling walker (2 wheeled) Gait Pattern/deviations: Step-through pattern;Decreased stride length;Wide base of support Gait velocity: decreased Gait velocity interpretation: <1.8 ft/sec, indicate of risk for recurrent falls General Gait Details: Wider BOS with increased foot external rotation, slow pace, no gross instability  Stairs            Wheelchair Mobility    Modified Rankin (Stroke Patients Only)       Balance Overall balance  assessment: Needs assistance Sitting-balance support: Feet supported Sitting balance-Leahy Scale: Good     Standing balance support: No upper extremity supported;During functional activity Standing balance-Leahy Scale: Fair                               Pertinent Vitals/Pain Pain Assessment: Faces Faces Pain Scale: Hurts a little bit Pain Location: L shoulder, upper abdomen Pain Descriptors / Indicators: Throbbing Pain Intervention(s): Limited activity within patient's tolerance;Monitored during session    [place] expects to be discharged to:: Private residence Living Arrangements: Alone   Type of Home: House Home Access: Stairs to enter   [employer] of Steps: 4 Home Layout: One level Home Equipment: [place] - single point;Walker - 2 wheels;Shower seat Additional Comments: PCA M-F 2 hours/day    Prior Function Level of Independence: Needs assistance   Gait / Transfers Assistance Needed: uses cane intermittently  ADL's / Homemaking Assistance Needed: PCA assists with ADL's, IADL's including bathing, dressing, meal prep        Hand Dominance        Extremity/Trunk Assessment   Upper Extremity Assessment Upper Extremity Assessment: Overall WFL for tasks assessed    Lower Extremity Assessment Lower Extremity Assessment: Overall WFL for tasks assessed    Cervical / Trunk Assessment Cervical / Trunk Assessment: Normal  Communication   Communication: No difficulties  Cognition Arousal/Alertness: Awake/alert Behavior During Therapy: WFL for tasks assessed/performed Overall Cognitive Status: Within Functional Limits for tasks assessed  General Comments      Exercises     Assessment/Plan    PT Assessment Patient needs continued PT services  PT Problem List Decreased strength;Decreased activity tolerance;Decreased balance;Decreased mobility;Pain       PT  Treatment Interventions DME instruction;Gait training;Stair training;Functional mobility training;Therapeutic activities;Therapeutic exercise;Balance training;Patient/family education    PT Goals (Current goals can be found in the Care Plan section)  Acute Rehab PT Goals Patient Stated Goal: return home PT Goal Formulation: With patient Time For Goal Achievement: 06/15/20 Potential to Achieve Goals: Good    Frequency Min 3X/week   Barriers to discharge        Co-evaluation               AM-PAC PT "6 Clicks" Mobility  Outcome Measure Help needed turning from your back to your side while in a flat bed without using bedrails?: None Help needed moving from lying on your back to sitting on the side of a flat bed without using bedrails?: None Help needed moving to and from a bed to a chair (including a wheelchair)?: None Help needed standing up from a chair using your arms (e.g., wheelchair or bedside chair)?: None Help needed to walk in hospital room?: None Help needed climbing 3-5 steps with a railing? : A Little 6 Click Score: 23    End of Session   Activity Tolerance: Patient tolerated treatment well Patient left: in chair;with call bell/phone within reach Nurse Communication: Mobility status PT Visit Diagnosis: Unsteadiness on feet (R26.81);Difficulty in walking, not elsewhere classified (R26.2)    Time: 7564-3329 PT Time Calculation (min) (ACUTE ONLY): 29 min   Charges:   PT Evaluation $PT Eval Moderate Complexity: 1 Mod PT Treatments $Therapeutic Activity: 8-22 mins        Wyona Almas, PT, DPT Acute Rehabilitation Services Pager [phone] Office [phone]   Deno Etienne 06/01/2020, 9:50 AM

## 2020-06-01 NOTE — Discharge Summary (Signed)
Physician Discharge Summary  Vasilisa Vore WYO:[id number] DOB: [birth year] DOA: 05/29/2020  PCP: Wendie Agreste, MD  Admit date: 05/29/2020 Discharge date: 06/01/2020  Admitted From: Home Disposition: Home  Recommendations for Outpatient Follow-up:  1. Follow up with PCP in 1 week with repeat CBC/BMP 2. Outpatient follow-up with cardiology and gastroenterology 3. Comply with medications and follow-up 4. Recommend outpatient follow-up with nephrology 5. Follow up in ED if symptoms worsen or new appear   Home Health: No Equipment/Devices: None  Discharge Condition: Stable CODE STATUS: Full Diet recommendation: Heart healthy  Brief/Interim Summary: 64 y.o.femalewith medical history significant ofCKD III, CHFpEF, HTN, GERD, Substance abuse, MDD, Bipolar disorder presented with chest pain and near syncope.  On presentation, creatinine was 2.73, baseline of 2.35.  High-sensitivity troponins x2 were negative, BNP was 265.  Chest x-ray was without any acute abnormality.  CT of the head was negative for acute abnormality.  She was started on IV fluids.  Cardiology was also consulted.  Echo showed EF of 60 to 65% with no regional wall motion abnormalities with grade 2 diastolic dysfunction.  Cardiology recommended outpatient follow-up and signed off.  During hospitalization, her symptoms have improved, renal function has improved.  Discharge patient home today with outpatient follow-up with cardiology, GI and nephrology.  Discharge Diagnoses:   Near syncope -Recurrent episodes of lightheadedness/near syncope for the last few days.  Initial orthostatic vitals negative in the ED. -Monitor orthostatic vitals. -Echo showed EF of 60 to 65% with no regional wall motion abnormalities with grade 2 diastolic dysfunction. -No further near syncopal episodes since hospitalization.  Patient feels okay to go home today.  Discharge patient home today.  Outpatient follow-up with cardiology regarding need  for outpatient cardiac monitoring.  Chest pain -Pain is atypical and located in the lower chest and upper abdomen.  Still complains of chest pain and now states that pain is radiating down her left arm and forearm. -Mildly tender to palpation in the lower chest wall and upper quadrant.  Troponins negative so far and EKG was nonischemic. -Patient has history of opiate abuse.  Patient keeps requesting for opiate medication.  Continue Voltaren gel along with Tylenol.  Use tramadol as needed and IV Toradol if severe pain.  Echo as above. -Cardiology evaluation appreciated: Cardiology does not suspect cardiac etiology of chest pain.  Cardiology will arrange for outpatient cardiac event monitor at discharge given history of palpitations.  Cardiology has signed off. -Might have a component of GERD:  Treated with IV Protonix.  Continue oral Protonix and as needed Maalox on discharge.  Consider GI evaluation and follow-up.  Chronic diastolic heart failure -Currently appears euvolemic.    Continue home regimen.  Outpatient follow-up with cardiology.  AKI on CKD stage IIIb Acute metabolic acidosis -Baseline creatinine around 2.5.  Presented with creatinine of 2.73.  Creatinine at 2.86 on 05/31/2020.   IV fluids was switched to bicarb drip on 05/31/2020.  Creatinine 2.56 today and bicarbonate is 23. -Outpatient follow-up of BMP   hypertension -Blood pressure still on the higher side.    Resume home regimen.  Outpatient follow-up.  GERD -Continue PPI  MDD History of bipolar disorder -Continue home Prozac and Ingrezza.  Outpatient follow-up with psychiatry  Morbid obesity -Outpatient follow-up  Tobacco abuse -Counseled on tobacco cessation.    History of substance abuse -History of crack cocaine and opiate use in the past.     Discharge Instructions  Discharge Instructions    Ambulatory referral to Cardiology   Complete  by: As directed    Hospital followup   Ambulatory referral to  Gastroenterology   Complete by: As directed    Abdominal pain   Diet - low sodium heart healthy   Complete by: As directed    Increase activity slowly   Complete by: As directed      Allergies as of 06/01/2020      Reactions   Abilify [aripiprazole] Other (See Comments)   Tardive dyskinesia Oral   Remeron [mirtazapine] Other (See Comments)   Wgt stimulation /gain, Dizziness, Patient says "can tolerate"   Trazodone And Nefazodone Other (See Comments)   Nightmares/sleep diturbance   Flexeril [cyclobenzaprine] Other (See Comments)   Pt states Flexeril makes her feel depressed    Amoxicillin Diarrhea, Other (See Comments)   NOTE the patient has had PCN WITHOUT reaction Has patient had a PCN reaction causing immediate rash, facial/tongue/throat swelling, SOB or lightheadedness with hypotension: No Has patient had a PCN reaction causing severe rash involving mucus membranes or skin necrosis: No Has patient had a PCN reaction that required hospitalization: No Has patient had a PCN reaction occurring within the last 10 years: No If all of the above answers are "NO", then may proceed with Cephalosporin use.      Medication List    TAKE these medications   alum & mag hydroxide-simeth 200-200-20 MG/5ML suspension Commonly known as: MAALOX/MYLANTA Take 15 mLs by mouth every 4 (four) hours as needed for indigestion or heartburn.   amLODipine 10 MG tablet Commonly known as: NORVASC Take 1 tablet (10 mg total) by mouth daily.   atorvastatin 40 MG tablet Commonly known as: LIPITOR TAKE 1 TABLET (40 MG TOTAL) BY MOUTH DAILY AT 6 PM.   FeroSul 325 (65 FE) MG tablet Generic drug: ferrous sulfate TAKE 1 TABLET BY MOUTH DAILY WITH BREAKFAST What changed: how much to take   FLUoxetine 40 MG capsule Commonly known as: PROZAC Take 40 mg by mouth daily.   gabapentin 800 MG tablet Commonly known as: NEURONTIN Take 0.5 tablets (400 mg total) by mouth 3 (three) times daily. What changed:  how much to take   hydrALAZINE 25 MG tablet Commonly known as: APRESOLINE Take 1 tablet (25 mg total) by mouth 3 (three) times daily.   Ingrezza 80 MG Caps Generic drug: Valbenazine Tosylate Take 80 mg by mouth daily.   isosorbide mononitrate 60 MG 24 hr tablet Commonly known as: IMDUR TAKE 1 TABLET BY MOUTH DAILY   labetalol 300 MG tablet Commonly known as: NORMODYNE Take 300 mg by mouth 2 (two) times daily. What changed: Another medication with the same name was removed. Continue taking this medication, and follow the directions you see here.   lidocaine 5 % Commonly known as: LIDODERM Place 1 patch onto the skin at bedtime. Remove & Discard patch within 12 hours or as directed by MD   meclizine 25 MG tablet Commonly known as: ANTIVERT Take 1 tablet (25 mg total) by mouth 3 (three) times daily as needed for dizziness.   melatonin 5 MG Tabs Take 5 mg by mouth at bedtime.   methocarbamol 500 MG tablet Commonly known as: ROBAXIN Take 1 tablet (500 mg total) by mouth every 6 (six) hours as needed for muscle spasms.   ondansetron 4 MG tablet Commonly known as: ZOFRAN TAKE 1 TABLET BY MOUTH EVERY 8 HOURS AS NEEDED FOR NAUSEA OR VOMITING What changed: See the new instructions.   pantoprazole 20 MG tablet Commonly known as: PROTONIX Take 1 tablet (  20 mg total) by mouth 2 (two) times daily.   traMADol 50 MG tablet Commonly known as: ULTRAM Take 1 tablet (50 mg total) by mouth every 12 (twelve) hours as needed for moderate pain.   Vitamin D3 50 MCG (2000 UT) capsule Take 2,000 Units by mouth daily.       Allergies  Allergen Reactions  . Abilify [Aripiprazole] Other (See Comments)    Tardive dyskinesia Oral  . Remeron [Mirtazapine] Other (See Comments)    Wgt stimulation /gain, Dizziness, Patient says "can tolerate"  . Trazodone And Nefazodone Other (See Comments)    Nightmares/sleep diturbance  . Flexeril [Cyclobenzaprine] Other (See Comments)    Pt states  Flexeril makes her feel depressed   . Amoxicillin Diarrhea and Other (See Comments)    NOTE the patient has had PCN WITHOUT reaction Has patient had a PCN reaction causing immediate rash, facial/tongue/throat swelling, SOB or lightheadedness with hypotension: No Has patient had a PCN reaction causing severe rash involving mucus membranes or skin necrosis: No Has patient had a PCN reaction that required hospitalization: No Has patient had a PCN reaction occurring within the last 10 years: No If all of the above answers are "NO", then may proceed with Cephalosporin use.     Consultations:  Cardiology   Procedures/Studies: CT Head Wo Contrast  Result Date: 05/29/2020 CLINICAL DATA:  Dizziness, nonspecific. EXAM: CT HEAD WITHOUT CONTRAST TECHNIQUE: Contiguous axial images were obtained from the base of the skull through the vertex without intravenous contrast. COMPARISON:  MRI/MRA head 05/09/2019. head CT 05/08/2019. FINDINGS: Brain: Cerebral volume is normal for age. Redemonstrated small chronic lacunar infarct within the left lentiform nucleus. Mild ill-defined hypoattenuation within the cerebral white matter is nonspecific, but compatible with chronic small vessel ischemic disease. There is no acute intracranial hemorrhage. No demarcated cortical infarct. No extra-axial fluid collection. No evidence of intracranial mass. No midline shift. Partially empty sella turcica. Vascular: No hyperdense vessel.  Atherosclerotic calcifications. Skull: Normal. Negative for fracture or focal lesion. Sinuses/Orbits: Visualized orbits show no acute finding. No significant paranasal sinus disease at the imaged levels. Small right mastoid effusion. IMPRESSION: No evidence of acute intracranial abnormality. Redemonstrated small chronic left basal ganglia lacunar infarct. Stable background mild cerebral white matter chronic small vessel ischemic disease. Partially empty sella turcica. This finding is very commonly  incidental, but can be associated with idiopathic intracranial hypertension. Small right mastoid effusion. Electronically Signed   By: Kellie Simmering DO   On: 05/29/2020 16:17   DG Chest Portable 1 View  Result Date: 05/29/2020 CLINICAL DATA:  Chest pain EXAM: PORTABLE CHEST 1 VIEW COMPARISON:  05/08/2019 FINDINGS: Artifact overlies the chest. Mild cardiomegaly. Mediastinal shadows are normal. Allowing for technical factors, the lungs are clear. No infiltrate, edema, collapse or effusion. No acute bone finding. IMPRESSION: Mild cardiomegaly. No active disease. Electronically Signed   By: Nelson Chimes M.D.   On: 05/29/2020 15:03   ECHOCARDIOGRAM COMPLETE  Result Date: 05/30/2020    ECHOCARDIOGRAM REPORT   Patient Name:   SHAWNTAE LOWY Date of Exam: 05/30/2020 Medical Rec #:  [id number]    Height:       62.0 in Accession #:    [id number]   Weight:       230.1 lb Date of Birth:  1956/06/07    BSA:          2.029 m Patient Age:    26 years     BP:  157/76 mmHg Patient Gender: F            HR:           61 bpm. Exam Location:  Inpatient Procedure: 2D Echo, Cardiac Doppler and Color Doppler Indications:    Chest Pain 786.50 / R07.9  History:        Patient has prior history of Echocardiogram examinations, most                 recent 05/09/2019. CHF, TIA, Signs/Symptoms:Dyspnea; Risk                 Factors:Hypertension and Current Smoker. Breast cancer. GERD.  Sonographer:    Vickie Epley RDCS Referring Phys: [id number] [place]  1. Left ventricular ejection fraction, by estimation, is 60 to 65%. The left ventricle has normal function. The left ventricle has no regional wall motion abnormalities. There is mild left ventricular hypertrophy. Left ventricular diastolic parameters are consistent with Grade II diastolic dysfunction (pseudonormalization). Elevated left atrial pressure.  2. Right ventricular systolic function is normal. The right ventricular size is normal. Tricuspid  regurgitation signal is inadequate for assessing PA pressure.  3. Left atrial size was mildly dilated.  4. The mitral valve is abnormal. No evidence of mitral valve regurgitation. Moderate mitral annular calcification.  5. The aortic valve is tricuspid. Aortic valve regurgitation is not visualized. No aortic stenosis is present.  6. The inferior vena cava is normal in size with greater than 50% respiratory variability, suggesting right atrial pressure of 3 mmHg. FINDINGS  Left Ventricle: Left ventricular ejection fraction, by estimation, is 60 to 65%. The left ventricle has normal function. The left ventricle has no regional wall motion abnormalities. The left ventricular internal cavity size was normal in size. There is  mild left ventricular hypertrophy. Left ventricular diastolic parameters are consistent with Grade II diastolic dysfunction (pseudonormalization). Elevated left atrial pressure. Right Ventricle: The right ventricular size is normal. Right vetricular wall thickness was not assessed. Right ventricular systolic function is normal. Tricuspid regurgitation signal is inadequate for assessing PA pressure. Left Atrium: Left atrial size was mildly dilated. Right Atrium: Right atrial size was normal in size. Pericardium: Trivial pericardial effusion is present. Mitral Valve: The mitral valve is abnormal. Moderate mitral annular calcification. No evidence of mitral valve regurgitation. Tricuspid Valve: The tricuspid valve is normal in structure. Tricuspid valve regurgitation is trivial. Aortic Valve: The aortic valve is tricuspid. Aortic valve regurgitation is not visualized. No aortic stenosis is present. Pulmonic Valve: The pulmonic valve was not well visualized. Pulmonic valve regurgitation is trivial. Aorta: The aortic root is normal in size and structure. Venous: The inferior vena cava is normal in size with greater than 50% respiratory variability, suggesting right atrial pressure of 3 mmHg. IAS/Shunts:  The interatrial septum was not well visualized.  LEFT VENTRICLE PLAX 2D LVIDd:         4.40 cm      Diastology LVIDs:         3.00 cm      LV e' medial:    5.76 cm/s LV PW:         0.90 cm      LV E/e' medial:  18.2 LV IVS:        0.90 cm      LV e' lateral:   7.14 cm/s LVOT diam:     2.00 cm      LV E/e' lateral: 14.7 LV SV:  78 LV SV Index:   39 LVOT Area:     3.14 cm  LV Volumes (MOD) LV vol d, MOD A2C: 135.0 ml LV vol d, MOD A4C: 139.0 ml LV vol s, MOD A2C: 50.6 ml LV vol s, MOD A4C: 50.2 ml LV SV MOD A2C:     84.4 ml LV SV MOD A4C:     139.0 ml LV SV MOD BP:      90.4 ml RIGHT VENTRICLE RV S prime:     14.30 cm/s TAPSE (M-mode): 2.4 cm LEFT ATRIUM             Index       RIGHT ATRIUM           Index LA diam:        4.20 cm 2.07 cm/m  RA Area:     13.10 cm LA Vol (A2C):   82.2 ml 40.51 ml/m RA Volume:   30.10 ml  14.83 ml/m LA Vol (A4C):   61.3 ml 30.21 ml/m LA Biplane Vol: 75.6 ml 37.26 ml/m  AORTIC VALVE LVOT Vmax:   111.00 cm/s LVOT Vmean:  72.600 cm/s LVOT VTI:    0.249 m  AORTA Ao Root diam: 3.30 cm MITRAL VALVE MV Area (PHT): 3.72 cm     SHUNTS MV Decel Time: 204 msec     Systemic VTI:  0.25 m MV E velocity: 105.00 cm/s  Systemic Diam: 2.00 cm MV A velocity: 96.30 cm/s MV E/A ratio:  1.09 Oswaldo Milian MD Electronically signed by Oswaldo Milian MD Signature Date/Time: 05/30/2020/3:06:56 PM    Final        Subjective: Patient seen and examined at bedside.  Poor historian.  States that she has mild abdominal pain but patient tolerated her breakfast this morning.  No vomiting.  No worsening shortness of breath or chest pain reported.  Discharge Exam: Vitals:   06/01/20 0823 06/01/20 0900  BP: (!) 180/88 (!) 172/87  Pulse: 73   Resp: 19   Temp: 98.8 F (37.1 C)   SpO2: 100%     General: Pt is alert, awake, not in acute distress.  Poor historian. Cardiovascular: rate controlled, S1/S2 + Respiratory: bilateral decreased breath sounds at bases Abdominal: Soft,  morbidly obese, NT, ND, bowel sounds + Extremities: Trace lower extremity edema; no cyanosis    The results of significant diagnostics from this hospitalization (including imaging, microbiology, ancillary and laboratory) are listed below for reference.     Microbiology: Recent Results (from the past 240 hour(s))  Respiratory Panel by RT PCR (Flu A&B, Covid) - Nasopharyngeal Swab     Status: None   Collection Time: 05/29/20 10:15 PM   Specimen: Nasopharyngeal Swab  Result Value Ref Range Status   SARS Coronavirus 2 by RT PCR NEGATIVE NEGATIVE Final    Comment: (NOTE) SARS-CoV-2 target nucleic acids are NOT DETECTED.  The SARS-CoV-2 RNA is generally detectable in upper respiratoy specimens during the acute phase of infection. The lowest concentration of SARS-CoV-2 viral copies this assay can detect is 131 copies/mL. A negative result does not preclude SARS-Cov-2 infection and should not be used as the sole basis for treatment or other patient management decisions. A negative result may occur with  improper specimen collection/handling, submission of specimen other than nasopharyngeal swab, presence of viral mutation(s) within the areas targeted by this assay, and inadequate number of viral copies (<131 copies/mL). A negative result must be combined with clinical observations, patient history, and epidemiological information. The expected result is  Negative.  Fact Sheet for Patients:  PinkCheek.be  Fact Sheet for Healthcare Providers:  GravelBags.it  This test is no t yet approved or cleared by the [place] FDA and  has been authorized for detection and/or diagnosis of SARS-CoV-2 by FDA under an Emergency Use Authorization (EUA). This EUA will remain  in effect (meaning this test can be used) for the duration of the COVID-19 declaration under Section 564(b)(1) of the Act, 21 U.S.C. section 360bbb-3(b)(1), unless the  authorization is terminated or revoked sooner.     Influenza A by PCR NEGATIVE NEGATIVE Final   Influenza B by PCR NEGATIVE NEGATIVE Final    Comment: (NOTE) The Xpert Xpress SARS-CoV-2/FLU/RSV assay is intended as an aid in  the diagnosis of influenza from Nasopharyngeal swab specimens and  should not be used as a sole basis for treatment. Nasal washings and  aspirates are unacceptable for Xpert Xpress SARS-CoV-2/FLU/RSV  testing.  Fact Sheet for Patients: PinkCheek.be  Fact Sheet for Healthcare Providers: GravelBags.it  This test is not yet approved or cleared by the [place] FDA and  has been authorized for detection and/or diagnosis of SARS-CoV-2 by  FDA under an Emergency Use Authorization (EUA). This EUA will remain  in effect (meaning this test can be used) for the duration of the  Covid-19 declaration under Section 564(b)(1) of the Act, 21  U.S.C. section 360bbb-3(b)(1), unless the authorization is  terminated or revoked. Performed at [facility], [place] [address], [place], [place] [address]      Labs: BNP (last 3 results) Recent Labs    05/29/20 1354  BNP 673.4*   Basic Metabolic Panel: Recent Labs  Lab 05/29/20 1354 05/30/20 0244 05/31/20 0231 06/01/20 0322  NA 140 142 141 140  K 4.7 4.3 5.0 4.7  CL 112* 111 112* 108  CO2 18* 20* 17* 23  GLUCOSE 96 115* 86 86  BUN 28* 28* 36* 31*  CREATININE 2.73* 2.75* 2.86* 2.56*  CALCIUM 9.0 9.3 8.9 8.9  MG  --   --  1.8 1.7   Liver Function Tests: Recent Labs  Lab 05/29/20 1354 05/30/20 0244 05/31/20 0231  AST 11* 12* 12*  ALT 10 7 10   ALKPHOS 86 81 72  BILITOT 0.3 0.4 0.4  PROT 6.5 6.5 6.2*  ALBUMIN 3.3* 3.2* 3.1*   Recent Labs  Lab 05/31/20 0231  LIPASE 50   No results for input(s): AMMONIA in the last 168 hours. CBC: Recent Labs  Lab 05/29/20 1223 05/29/20 1354 05/30/20 0244 05/31/20 0510 06/01/20 0322  WBC 6.5 7.4 6.8  6.9 6.7  NEUTROABS  --  5.3  --  4.4 4.6  HGB 8.9* 8.7* 9.1* 8.0* 8.0*  HCT 27.4* 28.8* 30.0* 25.8* 25.8*  MCV 86.8 91.1 90.4 91.5 89.0  PLT  --  255 262 238 228   Cardiac Enzymes: No results for input(s): CKTOTAL, CKMB, CKMBINDEX, TROPONINI in the last 168 hours. BNP: Invalid input(s): POCBNP CBG: Recent Labs  Lab 05/30/20 0828 05/31/20 0819 06/01/20 0819  GLUCAP 90 85 88   D-Dimer No results for input(s): DDIMER in the last 72 hours. Hgb A1c No results for input(s): HGBA1C in the last 72 hours. Lipid Profile No results for input(s): CHOL, HDL, LDLCALC, TRIG, CHOLHDL, LDLDIRECT in the last 72 hours. Thyroid function studies No results for input(s): TSH, T4TOTAL, T3FREE, THYROIDAB in the last 72 hours.  Invalid input(s): FREET3 Anemia work up Recent Labs    05/29/20 2320  VITAMINB12 367  FOLATE  5.9*  FERRITIN 188  TIBC 266  IRON 45  RETICCTPCT 2.0   Urinalysis    Component Value Date/Time   COLORURINE STRAW (A) 05/29/2020 2057   APPEARANCEUR CLEAR 05/29/2020 2057   LABSPEC 1.012 05/29/2020 2057   PHURINE 5.0 05/29/2020 2057   GLUCOSEU NEGATIVE 05/29/2020 2057   HGBUR NEGATIVE 05/29/2020 2057   BILIRUBINUR NEGATIVE 05/29/2020 2057   KETONESUR NEGATIVE 05/29/2020 2057   PROTEINUR 100 (A) 05/29/2020 2057   UROBILINOGEN 0.2 01/12/2010 1941   NITRITE NEGATIVE 05/29/2020 2057   LEUKOCYTESUR NEGATIVE 05/29/2020 2057   Sepsis Labs Invalid input(s): PROCALCITONIN,  WBC,  LACTICIDVEN Microbiology Recent Results (from the past 240 hour(s))  Respiratory Panel by RT PCR (Flu A&B, Covid) - Nasopharyngeal Swab     Status: None   Collection Time: 05/29/20 10:15 PM   Specimen: Nasopharyngeal Swab  Result Value Ref Range Status   SARS Coronavirus 2 by RT PCR NEGATIVE NEGATIVE Final    Comment: (NOTE) SARS-CoV-2 target nucleic acids are NOT DETECTED.  The SARS-CoV-2 RNA is generally detectable in upper respiratoy specimens during the acute phase of infection. The  lowest concentration of SARS-CoV-2 viral copies this assay can detect is 131 copies/mL. A negative result does not preclude SARS-Cov-2 infection and should not be used as the sole basis for treatment or other patient management decisions. A negative result may occur with  improper specimen collection/handling, submission of specimen other than nasopharyngeal swab, presence of viral mutation(s) within the areas targeted by this assay, and inadequate number of viral copies (<131 copies/mL). A negative result must be combined with clinical observations, patient history, and epidemiological information. The expected result is Negative.  Fact Sheet for Patients:  PinkCheek.be  Fact Sheet for Healthcare Providers:  GravelBags.it  This test is no t yet approved or cleared by the [place] FDA and  has been authorized for detection and/or diagnosis of SARS-CoV-2 by FDA under an Emergency Use Authorization (EUA). This EUA will remain  in effect (meaning this test can be used) for the duration of the COVID-19 declaration under Section 564(b)(1) of the Act, 21 U.S.C. section 360bbb-3(b)(1), unless the authorization is terminated or revoked sooner.     Influenza A by PCR NEGATIVE NEGATIVE Final   Influenza B by PCR NEGATIVE NEGATIVE Final    Comment: (NOTE) The Xpert Xpress SARS-CoV-2/FLU/RSV assay is intended as an aid in  the diagnosis of influenza from Nasopharyngeal swab specimens and  should not be used as a sole basis for treatment. Nasal washings and  aspirates are unacceptable for Xpert Xpress SARS-CoV-2/FLU/RSV  testing.  Fact Sheet for Patients: PinkCheek.be  Fact Sheet for Healthcare Providers: GravelBags.it  This test is not yet approved or cleared by the [place] FDA and  has been authorized for detection and/or diagnosis of SARS-CoV-2 by  FDA under  an Emergency Use Authorization (EUA). This EUA will remain  in effect (meaning this test can be used) for the duration of the  Covid-19 declaration under Section 564(b)(1) of the Act, 21  U.S.C. section 360bbb-3(b)(1), unless the authorization is  terminated or revoked. Performed at [facility], [place] [address], [place], [place] [address]      Time coordinating discharge: 35 minutes  SIGNED:   Aline August, MD  [facility] 06/01/2020, 9:43 AM

## 2020-06-03 ENCOUNTER — Ambulatory Visit (INDEPENDENT_AMBULATORY_CARE_PROVIDER_SITE_OTHER): Payer: Medicare HMO | Admitting: Behavioral Health

## 2020-06-03 ENCOUNTER — Other Ambulatory Visit: Payer: Self-pay

## 2020-06-03 ENCOUNTER — Telehealth: Payer: Self-pay | Admitting: *Deleted

## 2020-06-03 DIAGNOSIS — F1120 Opioid dependence, uncomplicated: Secondary | ICD-10-CM | POA: Diagnosis not present

## 2020-06-03 NOTE — Group Note (Signed)
Group Topic: Identifying Triggers  Group Date: 06/03/2020 Start Time:  9:00 AM End Time: 12:00 PM Facilitators: Allyne Gee, Counselor  Department: [facility]  Number of Participants: 2  Group Focus: acceptance, coping skills, family, relapse prevention, and safety plan Treatment Modality:  Cognitive Behavioral Therapy Interventions utilized were clarification, confrontation, and exploration Purpose: enhance coping skills, increase insight, reinforce self-care, relapse prevention strategies, and trigger / craving management   Name: Sherry [name] Date of Birth: 04-06-1956  MR: [id number]    Level of Participation: active Quality of Participation: attentive and cooperative Interactions with others: gave feedback Mood/Affect: appropriate Triggers (if applicable): N/A Cognition: coherent/clear Progress: Minimal Response: [name] checked-in by sharing that she was recently admitted to the hospital for medical reasons. [name] became tearful as she talked about her health concerns. She shared that this most recent incident scared her because she thought she was having a heart attack. She shared that she is looking forward to the upcoming holiday because she plans to spend time with there family. [name] shared that in the past she would drugs to cope through the holidays as it is the anniversary of her mother's death. [name] further shared that being with her family isn't a trigger but more of a safe place because they will hold her accountable to her sobriety.   Plan: follow-up needed  Patients Problems:  Patient Active Problem List   Diagnosis Date Noted  . Chest pain 05/31/2020  . Near syncope 05/29/2020  . Chest pain, rule out acute myocardial infarction 05/29/2020  . Chronic diastolic CHF (congestive heart failure) ([place]) 05/09/2019  . GERD (gastroesophageal reflux disease) 05/09/2019  . Alcohol abuse 05/09/2019  . Hypertension 05/08/2019  . Bilateral low back  pain with bilateral sciatica 01/05/2019  . Cyst of ovary   . Pre-operative cardiovascular examination 06/13/2018  . Dizziness 02/24/2018  . Left-sided weakness 02/24/2018  . Cigarette smoker 12/31/2017  . DOE (dyspnea on exertion) 12/30/2017  . Phlebitis after infusion 10/28/2017  . Hypertensive emergency 10/21/2017  . Microcytic anemia 10/21/2017  . Medication intolerance 09/06/2017  . Neuroleptic-induced tardive dyskinesia 09/06/2017  . Cancer ([place]) 09/06/2017  . Chronic low back pain 09/06/2017  . Grief reaction with prolonged bereavement 09/06/2017  . Opioid use disorder, severe, dependence ([place]) 08/27/2017  . Alcohol use disorder, severe, dependence ([place]) 08/27/2017  . Substance induced mood disorder ([place]) 08/13/2017  . Acute kidney injury superimposed on CKD ([place])   . MDD (major depressive disorder), recurrent severe, without psychosis ([place])   . Alcohol withdrawal ([place]) 08/09/2017  . Essential hypertension 07/06/2017  . Chronic kidney disease, stage III (moderate) ([place]) 11/17/2016  . Depression with anxiety 08/21/2016  . Hx of bipolar disorder 01/31/2016  . History of breast cancer 02/13/2013  . Morbid obesity due to excess calories ([place]) 02/13/2013  . Hepatitis C virus infection cured after antiviral drug therapy 12/17/2012     Family Program: Family present? No   Name of family member(s): N/A  UDS collected: No Results: N/A  AA/NA attended?: No  Sponsor?: No

## 2020-06-03 NOTE — Telephone Encounter (Signed)
Needs a hospital follow up discharged on 06-01-2020

## 2020-06-04 ENCOUNTER — Encounter (HOSPITAL_COMMUNITY): Payer: Self-pay

## 2020-06-04 ENCOUNTER — Inpatient Hospital Stay (HOSPITAL_COMMUNITY)
Admission: RE | Admit: 2020-06-04 | Discharge: 2020-06-04 | Disposition: A | Discharge status: Home / Self Care | Payer: Medicare HMO | Source: Ambulatory Visit | Attending: Nephrology | Admitting: Nephrology

## 2020-06-05 ENCOUNTER — Other Ambulatory Visit: Payer: Self-pay | Admitting: Neurosurgery

## 2020-06-05 ENCOUNTER — Ambulatory Visit (HOSPITAL_COMMUNITY): Payer: Self-pay

## 2020-06-05 DIAGNOSIS — M542 Cervicalgia: Secondary | ICD-10-CM

## 2020-06-05 DIAGNOSIS — G8929 Other chronic pain: Secondary | ICD-10-CM

## 2020-06-07 ENCOUNTER — Ambulatory Visit (HOSPITAL_COMMUNITY): Payer: Self-pay

## 2020-06-10 ENCOUNTER — Ambulatory Visit (HOSPITAL_COMMUNITY): Payer: Self-pay | Admitting: Behavioral Health

## 2020-06-11 ENCOUNTER — Encounter (HOSPITAL_COMMUNITY): Payer: Medicare HMO

## 2020-06-11 ENCOUNTER — Telehealth: Payer: Self-pay | Admitting: Family Medicine

## 2020-06-11 DIAGNOSIS — G8929 Other chronic pain: Secondary | ICD-10-CM | POA: Diagnosis not present

## 2020-06-11 DIAGNOSIS — I130 Hypertensive heart and chronic kidney disease with heart failure and stage 1 through stage 4 chronic kidney disease, or unspecified chronic kidney disease: Secondary | ICD-10-CM | POA: Diagnosis not present

## 2020-06-11 DIAGNOSIS — N1832 Chronic kidney disease, stage 3b: Secondary | ICD-10-CM | POA: Diagnosis not present

## 2020-06-11 DIAGNOSIS — M549 Dorsalgia, unspecified: Secondary | ICD-10-CM | POA: Diagnosis not present

## 2020-06-11 DIAGNOSIS — I5032 Chronic diastolic (congestive) heart failure: Secondary | ICD-10-CM | POA: Diagnosis not present

## 2020-06-11 DIAGNOSIS — I059 Rheumatic mitral valve disease, unspecified: Secondary | ICD-10-CM | POA: Diagnosis not present

## 2020-06-11 DIAGNOSIS — F17200 Nicotine dependence, unspecified, uncomplicated: Secondary | ICD-10-CM | POA: Diagnosis not present

## 2020-06-11 DIAGNOSIS — I10 Essential (primary) hypertension: Secondary | ICD-10-CM | POA: Diagnosis not present

## 2020-06-11 DIAGNOSIS — G2401 Drug induced subacute dyskinesia: Secondary | ICD-10-CM | POA: Diagnosis not present

## 2020-06-11 DIAGNOSIS — I0981 Rheumatic heart failure: Secondary | ICD-10-CM | POA: Diagnosis not present

## 2020-06-11 NOTE — Telephone Encounter (Signed)
Sherry [name] with [name] calling pt is out of Hospital. He is  needing verbal for PT orders  PT Two  times  a week for 5 weeks and Once  a week for Two  weeks   [phone] [name]   Also pt states that she is no longer taking  atorvastatin  Needs to be taken off her med list

## 2020-06-11 NOTE — Telephone Encounter (Signed)
Called LM for Sherry [name]. Asking for call back to allow verbal requests to be submitted also have deactivated Atorvastatin

## 2020-06-12 ENCOUNTER — Other Ambulatory Visit: Payer: Self-pay

## 2020-06-12 ENCOUNTER — Ambulatory Visit (INDEPENDENT_AMBULATORY_CARE_PROVIDER_SITE_OTHER): Payer: Medicare HMO | Admitting: Behavioral Health

## 2020-06-12 ENCOUNTER — Encounter (HOSPITAL_COMMUNITY): Payer: Self-pay | Admitting: Medical

## 2020-06-12 VITALS — BP 172/87 | HR 73 | Ht 62.0 in | Wt 234.6 lb

## 2020-06-12 DIAGNOSIS — M5442 Lumbago with sciatica, left side: Secondary | ICD-10-CM

## 2020-06-12 DIAGNOSIS — F1121 Opioid dependence, in remission: Secondary | ICD-10-CM

## 2020-06-12 DIAGNOSIS — I10 Essential (primary) hypertension: Secondary | ICD-10-CM

## 2020-06-12 DIAGNOSIS — N184 Chronic kidney disease, stage 4 (severe): Secondary | ICD-10-CM | POA: Diagnosis not present

## 2020-06-12 DIAGNOSIS — K2100 Gastro-esophageal reflux disease with esophagitis, without bleeding: Secondary | ICD-10-CM

## 2020-06-12 DIAGNOSIS — F1021 Alcohol dependence, in remission: Secondary | ICD-10-CM | POA: Diagnosis not present

## 2020-06-12 DIAGNOSIS — F1120 Opioid dependence, uncomplicated: Secondary | ICD-10-CM

## 2020-06-12 DIAGNOSIS — F4381 Prolonged grief disorder: Secondary | ICD-10-CM

## 2020-06-12 DIAGNOSIS — I5032 Chronic diastolic (congestive) heart failure: Secondary | ICD-10-CM

## 2020-06-12 DIAGNOSIS — G8929 Other chronic pain: Secondary | ICD-10-CM

## 2020-06-12 DIAGNOSIS — F418 Other specified anxiety disorders: Secondary | ICD-10-CM

## 2020-06-12 DIAGNOSIS — Z853 Personal history of malignant neoplasm of breast: Secondary | ICD-10-CM

## 2020-06-12 DIAGNOSIS — M5441 Lumbago with sciatica, right side: Secondary | ICD-10-CM

## 2020-06-12 DIAGNOSIS — Z87898 Personal history of other specified conditions: Secondary | ICD-10-CM | POA: Diagnosis not present

## 2020-06-12 DIAGNOSIS — F4329 Adjustment disorder with other symptoms: Secondary | ICD-10-CM

## 2020-06-12 DIAGNOSIS — N183 Chronic kidney disease, stage 3 unspecified: Secondary | ICD-10-CM

## 2020-06-12 DIAGNOSIS — F17210 Nicotine dependence, cigarettes, uncomplicated: Secondary | ICD-10-CM | POA: Diagnosis not present

## 2020-06-12 DIAGNOSIS — F1210 Cannabis abuse, uncomplicated: Secondary | ICD-10-CM

## 2020-06-12 DIAGNOSIS — I951 Orthostatic hypotension: Secondary | ICD-10-CM
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
## 2020-06-12 NOTE — Telephone Encounter (Signed)
Noted. She has had variable BP in past - with meds adjusted by nephrology - may want to reach out to them to discuss BP levels and plan, but if over 160/100 - recommend she be seen.

## 2020-06-12 NOTE — Telephone Encounter (Signed)
FYI to provider that pt Bp is running high.   I have confirmed the verbal orders and if the BP goes up to around 180/100 then he will call us back and inform us to get additional steps if needed.   Also the verbal orders were already given for the PT.

## 2020-06-12 NOTE — Telephone Encounter (Signed)
Arnold missed call yesterday / The patients BP is  Currently running164 over 90 and pt  BP at hospital was  180 over 100/ needs to know what patients perimeters are . So they will know when to contact Dr.Greene

## 2020-06-12 NOTE — Group Note (Signed)
Group Topic: Relapse Prevention  Group Date: 06/12/2020 Start Time:  9:00 AM End Time: 12:00 PM Facilitators: Allyne Gee, Counselor  Department: [facility]  Number of Participants: 3  Group Focus: acceptance, affirmation, feeling awareness/expression, and relapse prevention Treatment Modality:  Cognitive Behavioral Therapy Interventions utilized were clarification, confrontation, exploration, and group exercise Purpose: enhance coping skills, explore maladaptive thinking, increase insight, and relapse prevention strategies   Name: Sherry [name] Date of Birth: 1956/06/16  MR: [id number]    Level of Participation: minimal Quality of Participation: attentive and cooperative Interactions with others: gave feedback Mood/Affect: appropriate Triggers (if applicable): N/A Cognition: coherent/clear Progress: Gaining insight Response: Sherry [name] checked-in by sharing that she was confused by her Medicaid transportation. She reports she did not sleep well last night. Sherry [name] shared that she has only gotten 2-4 hours of sleep. She shared with the she enjoyed spending the Thanksgiving holiday with her family members. Sherry [name] provided insightful verbal feedback during group discussion. She shared past acute withdrawal sxs that she has experienced while also identifying the current post-acute withdrawal sxs she often experiences. She reports often experiencing the following sxs: variable energy, variable concentration, and insomnia.  Plan: follow-up needed  Patients Problems:  Patient Active Problem List   Diagnosis Date Noted  . Chest pain 05/31/2020  . Near syncope 05/29/2020  . Chest pain, rule out acute myocardial infarction 05/29/2020  . Chronic diastolic CHF (congestive heart failure) ([place]) 05/09/2019  . GERD (gastroesophageal reflux disease) 05/09/2019  . Alcohol abuse 05/09/2019  . Hypertension 05/08/2019  . Bilateral low back pain with bilateral sciatica 01/05/2019   . Cyst of ovary   . Pre-operative cardiovascular examination 06/13/2018  . Dizziness 02/24/2018  . Left-sided weakness 02/24/2018  . Cigarette smoker 12/31/2017  . DOE (dyspnea on exertion) 12/30/2017  . Phlebitis after infusion 10/28/2017  . Hypertensive emergency 10/21/2017  . Microcytic anemia 10/21/2017  . Medication intolerance 09/06/2017  . Neuroleptic-induced tardive dyskinesia 09/06/2017  . Cancer ([place]) 09/06/2017  . Chronic low back pain 09/06/2017  . Grief reaction with prolonged bereavement 09/06/2017  . Opioid use disorder, severe, dependence ([place]) 08/27/2017  . Alcohol use disorder, severe, dependence ([place]) 08/27/2017  . Substance induced mood disorder ([place]) 08/13/2017  . Acute kidney injury superimposed on CKD ([place])   . MDD (major depressive disorder), recurrent severe, without psychosis ([place])   . Alcohol withdrawal ([place]) 08/09/2017  . Essential hypertension 07/06/2017  . Chronic kidney disease, stage III (moderate) ([place]) 11/17/2016  . Depression with anxiety 08/21/2016  . Hx of bipolar disorder 01/31/2016  . History of breast cancer 02/13/2013  . Morbid obesity due to excess calories ([place]) 02/13/2013  . Hepatitis C virus infection cured after antiviral drug therapy 12/17/2012     Family Program: Family present? No   Name of family member(s): N/A  UDS collected: No Results: N/A  AA/NA attended?: No  Sponsor?: No

## 2020-06-13 DIAGNOSIS — I059 Rheumatic mitral valve disease, unspecified: Secondary | ICD-10-CM | POA: Diagnosis not present

## 2020-06-13 DIAGNOSIS — N1832 Chronic kidney disease, stage 3b: Secondary | ICD-10-CM | POA: Diagnosis not present

## 2020-06-13 DIAGNOSIS — F17200 Nicotine dependence, unspecified, uncomplicated: Secondary | ICD-10-CM | POA: Diagnosis not present

## 2020-06-13 DIAGNOSIS — I130 Hypertensive heart and chronic kidney disease with heart failure and stage 1 through stage 4 chronic kidney disease, or unspecified chronic kidney disease: Secondary | ICD-10-CM | POA: Diagnosis not present

## 2020-06-13 DIAGNOSIS — M549 Dorsalgia, unspecified: Secondary | ICD-10-CM | POA: Diagnosis not present

## 2020-06-13 DIAGNOSIS — G2401 Drug induced subacute dyskinesia: Secondary | ICD-10-CM | POA: Diagnosis not present

## 2020-06-13 DIAGNOSIS — G8929 Other chronic pain: Secondary | ICD-10-CM | POA: Diagnosis not present

## 2020-06-13 DIAGNOSIS — I0981 Rheumatic heart failure: Secondary | ICD-10-CM | POA: Diagnosis not present

## 2020-06-13 DIAGNOSIS — I5032 Chronic diastolic (congestive) heart failure: Secondary | ICD-10-CM | POA: Diagnosis not present

## 2020-06-13 NOTE — Telephone Encounter (Signed)
Spoke with pt, says she has an appt with her kidney doctor soon. She will speak with them about the bp spikings.She will also follow up with pcp

## 2020-06-14 ENCOUNTER — Ambulatory Visit (INDEPENDENT_AMBULATORY_CARE_PROVIDER_SITE_OTHER): Payer: Medicare HMO | Admitting: Behavioral Health

## 2020-06-14 ENCOUNTER — Other Ambulatory Visit: Payer: Self-pay

## 2020-06-14 DIAGNOSIS — F1121 Opioid dependence, in remission: Secondary | ICD-10-CM | POA: Diagnosis not present

## 2020-06-14 NOTE — Group Note (Signed)
Group Topic: Thought Challenging  Group Date: 06/14/2020 Start Time:  9:00 AM End Time: 12:00 PM Facilitators: Allyne Gee, Counselor  Department: [facility]  Number of Participants: 2  Group Focus: acceptance, affirmation, anxiety, coping skills, depression, and relapse prevention Treatment Modality:  Cognitive Behavioral Therapy Interventions utilized were clarification, confrontation, and exploration Purpose: increase insight, regain self-worth, reinforce self-care, and relapse prevention strategies   Name: Sherry [name] Date of Birth: 1955-09-17  MR: [id number]    Level of Participation: active Quality of Participation: attentive and cooperative Interactions with others: gave feedback Mood/Affect: appropriate Triggers (if applicable): N/A  Cognition: coherent/clear and insightful Progress: Gaining insight Response: Sherry [name] checked-in by sharing that she was able to get an adequate amount of sleep the last 2 nights. She shared that she feels "pretty good" today since she has been able to sleep well. Sherry [name] shared that when she is faced with things she cannot control she tells herself "it is what it is" and she doesn't dwell on things that are outside of her control. Sherry [name] provided insightful verbal feedback while also receptive to advice offered during group discussion. Group also watched the film Beautiful Boy and discussed the film at the end. Sherry [name] was alert and attentive through film and shared her thoughts regarding the film. Plan: follow-up needed  Patients Problems:  Patient Active Problem List   Diagnosis Date Noted  . Chest pain 05/31/2020  . Near syncope 05/29/2020  . Chest pain, rule out acute myocardial infarction 05/29/2020  . Chronic diastolic CHF (congestive heart failure) ([place]) 05/09/2019  . GERD (gastroesophageal reflux disease) 05/09/2019  . Alcohol abuse 05/09/2019  . Hypertension 05/08/2019  . Bilateral low back pain with bilateral  sciatica 01/05/2019  . Cyst of ovary   . Pre-operative cardiovascular examination 06/13/2018  . Dizziness 02/24/2018  . Left-sided weakness 02/24/2018  . Cigarette smoker 12/31/2017  . DOE (dyspnea on exertion) 12/30/2017  . Phlebitis after infusion 10/28/2017  . Hypertensive emergency 10/21/2017  . Microcytic anemia 10/21/2017  . Medication intolerance 09/06/2017  . Neuroleptic-induced tardive dyskinesia 09/06/2017  . Cancer ([place]) 09/06/2017  . Chronic low back pain 09/06/2017  . Grief reaction with prolonged bereavement 09/06/2017  . Opioid use disorder, severe, dependence ([place]) 08/27/2017  . Alcohol use disorder, severe, dependence ([place]) 08/27/2017  . Substance induced mood disorder ([place]) 08/13/2017  . Acute kidney injury superimposed on CKD ([place])   . MDD (major depressive disorder), recurrent severe, without psychosis ([place])   . Alcohol withdrawal ([place]) 08/09/2017  . Essential hypertension 07/06/2017  . Chronic kidney disease, stage III (moderate) ([place]) 11/17/2016  . Depression with anxiety 08/21/2016  . Hx of bipolar disorder 01/31/2016  . History of breast cancer 02/13/2013  . Morbid obesity due to excess calories ([place]) 02/13/2013  . Hepatitis C virus infection cured after antiviral drug therapy 12/17/2012     Family Program: Family present? No   Name of family member(s): N/A   UDS collected: No Results: N/A  AA/NA attended?: No  Sponsor?: No

## 2020-06-17 ENCOUNTER — Telehealth: Payer: Self-pay | Admitting: Cardiology

## 2020-06-17 ENCOUNTER — Ambulatory Visit (HOSPITAL_COMMUNITY): Payer: Self-pay | Admitting: Behavioral Health

## 2020-06-17 ENCOUNTER — Encounter (HOSPITAL_COMMUNITY): Payer: Self-pay | Admitting: Behavioral Health

## 2020-06-17 NOTE — Telephone Encounter (Signed)
Pt called in and stated that she was suppose to come in sooner for a fu the 1/27 because she was in the hosp for chest pain.  Dr Geraldo Pitter does not have anything on a tues or thus per patient .  Please advise

## 2020-06-17 NOTE — Telephone Encounter (Signed)
Left message to call back  

## 2020-06-18 ENCOUNTER — Other Ambulatory Visit: Payer: Self-pay | Admitting: Family Medicine

## 2020-06-18 DIAGNOSIS — D509 Iron deficiency anemia, unspecified: Secondary | ICD-10-CM

## 2020-06-18 NOTE — Telephone Encounter (Signed)
Requested medication (s) are due for refill today: yes  Requested medication (s) are on the active medication list: Vit d is not on active list  Last refill:  Vit D expired rx, and not on med list, Vangie Bicker  11/29/19 expired, Imdur #30 0 refills 03/12/20  Future visit scheduled: yes  Notes to clinic:  2 expired Rx, Imdur failed because of BP    Requested Prescriptions  Pending Prescriptions Disp Refills   isosorbide mononitrate (IMDUR) 60 MG 24 hr tablet [Pharmacy Med Name: Isosorbide Mononitrate ER 60MG  TB24] 30 tablet 0    Sig: TAKE 1 TABLET BY MOUTH DAILY      Cardiovascular:  Nitrates Failed - 06/18/2020 12:00 PM      Failed - Last BP in normal range    BP Readings from Last 1 Encounters:  06/01/20 (!) 172/87          Passed - Last Heart Rate in normal range    Pulse Readings from Last 1 Encounters:  06/01/20 73          Passed - Valid encounter within last 12 months    Recent Outpatient Visits           2 weeks ago Chest pain, unspecified type   [facility], Ranell Patrick, MD   1 month ago Abdominal pain, chronic, epigastric   [facility], Ranell Patrick, MD   2 months ago Anemia in stage 3 chronic kidney disease, unspecified whether stage 3a or 3b CKD   [facility], Fenton Malling, MD   3 months ago Morbid obesity [facility])   [facility], Ranell Patrick, MD   4 months ago Abdominal pain, epigastric   [facility], Ranell Patrick, MD       Future Appointments             In 2 days Wendie Agreste, MD Primary [facility] at [place], [facility]   In 1 month Revankar, Reita Cliche, MD [facility] [place]              FEROSUL 325 (65 Fe) MG tablet [Pharmacy Med Name: Ferrous Sulfate 325 (65 Fe)MG TABS] 30 tablet 1    Sig: TAKE 1 TABLET BY MOUTH DAILY WITH BREAKFAST      Endocrinology:  Minerals - Iron Supplementation Failed - 06/18/2020 12:00 PM      Failed - HGB in normal range and within 360 days     Hemoglobin  Date Value Ref Range Status  06/01/2020 8.0 (L) 12.0 - 15.0 g/dL Final  02/26/2020 10.0 (L) 11.1 - 15.9 g/dL Final          Failed - HCT in normal range and within 360 days    HCT  Date Value Ref Range Status  06/01/2020 25.8 (L) 36 - 46 % Final   Hematocrit  Date Value Ref Range Status  02/26/2020 31.3 (L) 34.0 - 46.6 % Final          Failed - RBC in normal range and within 360 days    RBC  Date Value Ref Range Status  06/01/2020 2.90 (L) 3.87 - 5.11 MIL/uL Final          Passed - Fe (serum) in normal range and within 360 days    Iron  Date Value Ref Range Status  05/29/2020 45 28 - 170 ug/dL Final  04/04/2020 59 27 - 139 ug/dL Final   Iron Saturation  Date Value Ref Range Status  04/04/2020 27 15 - 55 % Final   Saturation Ratios  Date Value Ref Range Status  05/29/2020 17 10.4 - 31.8 % Final          Passed - Ferritin in normal range and within 360 days    Ferritin  Date Value Ref Range Status  05/29/2020 188 11 - 307 ng/mL Final    Comment:    Performed at [facility] [address] [place], [place] [address]  04/04/2020 408 (H) 15.0 - 150.0 ng/mL Final          Passed - Valid encounter within last 12 months    Recent Outpatient Visits           2 weeks ago Chest pain, unspecified type   [facility], Ranell Patrick, MD   1 month ago Abdominal pain, chronic, epigastric   [facility], Ranell Patrick, MD   2 months ago Anemia in stage 3 chronic kidney disease, unspecified whether stage 3a or 3b CKD   [facility] Care at [facility], Fenton Malling, MD   3 months ago Morbid obesity [facility])   [facility] at [facility], Ranell Patrick, MD   4 months ago Abdominal pain, epigastric   [facility] at [facility], Ranell Patrick, MD       Future Appointments             In 2 days Wendie Agreste, MD Primary Care at [place], [facility]   In 1 month Revankar, Reita Cliche, MD [facility] [place]               Cholecalciferol (VITAMIN D3) 50 MCG (2000 UT) capsule [Pharmacy Med Name: Vitamin D3 50 MCG(2000 UT) CAPS] 30 capsule     Sig: TAKE 1 CAPSULE BY MOUTH DAILY      Endocrinology:  Vitamins - Vitamin D Supplementation Failed - 06/18/2020 12:00 PM      Failed - 50,000 IU strengths are not delegated      Failed - Phosphate in normal range and within 360 days    Phosphorus  Date Value Ref Range Status  05/11/2019 4.8 (H) 2.5 - 4.6 mg/dL Final          Failed - Vitamin D in normal range and within 360 days    Vit D, 25-Hydroxy  Date Value Ref Range Status  05/16/2019 14.0  Final          Passed - Ca in normal range and within 360 days    Calcium  Date Value Ref Range Status  06/01/2020 8.9 8.9 - 10.3 mg/dL Final          Passed - Valid encounter within last 12 months    Recent Outpatient Visits           2 weeks ago Chest pain, unspecified type   [facility], Ranell Patrick, MD   1 month ago Abdominal pain, chronic, epigastric   [facility], Ranell Patrick, MD   2 months ago Anemia in stage 3 chronic kidney disease, unspecified whether stage 3a or 3b CKD   [facility], Fenton Malling, MD   3 months ago Morbid obesity [facility])   [facility] at [facility], Ranell Patrick, MD   4 months ago Abdominal pain, epigastric   [facility] at [facility], Ranell Patrick, MD       Future Appointments  In 2 days Wendie Agreste, MD [facility] at [place], [facility]   In 1 month Revankar, Reita Cliche, MD [facility]

## 2020-06-18 NOTE — Telephone Encounter (Signed)
Patient has upcoming appt 

## 2020-06-19 ENCOUNTER — Ambulatory Visit (HOSPITAL_COMMUNITY): Payer: Self-pay | Admitting: Behavioral Health

## 2020-06-20 ENCOUNTER — Ambulatory Visit (INDEPENDENT_AMBULATORY_CARE_PROVIDER_SITE_OTHER): Payer: Medicare HMO | Admitting: Family Medicine

## 2020-06-20 ENCOUNTER — Encounter: Payer: Self-pay | Admitting: Family Medicine

## 2020-06-20 ENCOUNTER — Other Ambulatory Visit: Payer: Self-pay

## 2020-06-20 VITALS — BP 144/90 | HR 67 | Temp 97.8°F | Ht 62.0 in | Wt 230.0 lb

## 2020-06-20 DIAGNOSIS — D631 Anemia in chronic kidney disease: Secondary | ICD-10-CM | POA: Diagnosis not present

## 2020-06-20 DIAGNOSIS — N184 Chronic kidney disease, stage 4 (severe): Secondary | ICD-10-CM | POA: Diagnosis not present

## 2020-06-20 DIAGNOSIS — I0981 Rheumatic heart failure: Secondary | ICD-10-CM | POA: Diagnosis not present

## 2020-06-20 DIAGNOSIS — M545 Low back pain, unspecified: Secondary | ICD-10-CM

## 2020-06-20 DIAGNOSIS — N189 Chronic kidney disease, unspecified: Secondary | ICD-10-CM | POA: Diagnosis not present

## 2020-06-20 DIAGNOSIS — R55 Syncope and collapse: Secondary | ICD-10-CM | POA: Diagnosis not present

## 2020-06-20 DIAGNOSIS — M5431 Sciatica, right side: Secondary | ICD-10-CM

## 2020-06-20 DIAGNOSIS — I5032 Chronic diastolic (congestive) heart failure: Secondary | ICD-10-CM | POA: Diagnosis not present

## 2020-06-20 DIAGNOSIS — I059 Rheumatic mitral valve disease, unspecified: Secondary | ICD-10-CM | POA: Diagnosis not present

## 2020-06-20 DIAGNOSIS — G8929 Other chronic pain: Secondary | ICD-10-CM

## 2020-06-20 DIAGNOSIS — G2401 Drug induced subacute dyskinesia: Secondary | ICD-10-CM | POA: Diagnosis not present

## 2020-06-20 DIAGNOSIS — F17200 Nicotine dependence, unspecified, uncomplicated: Secondary | ICD-10-CM | POA: Diagnosis not present

## 2020-06-20 DIAGNOSIS — M549 Dorsalgia, unspecified: Secondary | ICD-10-CM | POA: Diagnosis not present

## 2020-06-20 DIAGNOSIS — R079 Chest pain, unspecified: Secondary | ICD-10-CM | POA: Diagnosis not present

## 2020-06-20 DIAGNOSIS — N1832 Chronic kidney disease, stage 3b: Secondary | ICD-10-CM | POA: Diagnosis not present

## 2020-06-20 DIAGNOSIS — I130 Hypertensive heart and chronic kidney disease with heart failure and stage 1 through stage 4 chronic kidney disease, or unspecified chronic kidney disease: Secondary | ICD-10-CM | POA: Diagnosis not present

## 2020-06-20 MED ORDER — METHOCARBAMOL 500 MG PO TABS: 500.0000 mg | ORAL_TABLET | Freq: Four times a day (QID) | ORAL | 0 refills | Status: DC | PRN

## 2020-06-20 NOTE — Progress Notes (Signed)
Subjective:  Patient ID: Sherry [name], female    DOB: 1956-03-18  Age: 64 y.o. MRN: [id number]  CC:  Chief Complaint  Patient presents with  . Hospitalization Follow-up    Pt was sent to the hospital from our office on 05/29/2020. Pt reports no issues with the symptoms that she had on 05/29/2020 that caused [place] to send her to the ED.   . Back Pain    Pt reports that she has had pain in the R side of her lower back that started 3 days ago. Pt states is shoots from the R side of her lower back through her R hip and down the R leg.    HPI Sadae Arrazola presents for   Hospital follow-up: Admitted November 17 through November 20 after initial presentation with chest pain and near syncope.  Found to be in acute kidney injury on chronic kidney disease with creatinine 2.73, above her baseline of 2.35.  Troponins were negative, BNP 265.  Cardiology consulted, chest x-ray without acute abnormality, CT head negative for acute abnormality.  Treated with IV fluids.  Echo with EF of 60 to 65% without regional wall motion abnormalities.  Outpatient cardiology follow-up planned.  Symptoms improved, renal function improved, plan for outpatient cardiology, gastroenterology, nephrology follow-up.  Near syncope: No further episodes in hospital.  IV fluid as above.  Cardiology follow-up plan for outpatient eval/monitoring. Cardiology - Dr. Geraldo Pitter - she has called for appt - waiting on call back.   Chest pain: Atypical chest pain, lower chest, upper abdomen.  Has had some chronic abdominal pains.  History of opiate abuse, request for opiate medications in the hospital but treated with Voltaren gel, Tylenol, tramadol as needed with IV Toradol for severe pain.  Not thought to have cardiac etiology of chest pain.  Possible component of GERD and was treated with Protonix.  Oral Protonix with as needed Maalox on discharge. Still taking protonix BID, no maalox needed.  She has not had near syncope, or chest pain since  leaving hospital.   AKI on CKD 3B. Creatinine 2.73 on presentation, improved with IV fluids, metabolic acidosis noted, bicarb improved to 23 with creatinine 2.56.  Follow-up BMP outpatient planned.  She is followed by nephrology, and has had some labile blood pressure as well as some difficulty with control previously. Has not had nephrology follow up since hospitalization.   Anemia of chronic disease - HGB 8.0 on 11/20. Taking iron 325mg  qd. HGB  Plan on IV iron ordered by nephrology.   New complaint of right low back pain: Started 3 days ago, no fall, no injury, no blood in urine or trouble urinating.  Moves down R leg to toes. Has had some radiating pain to R leg in past, but more intense past few days.  Taking gabapentin 800mg  tid, tylenol every 8 hours.  Followed by Dr. Arnoldo Morale for back issues. Saw him 1 month ago. Prior back surgeries in 80-90's.  Has MRI scheduled by Dr. Arnoldo Morale in 12 days.  No bowel or bladder incontinence, no saddle anesthesia, no lower extremity weakness. Uses cane some days only.  Ice, heat used a few times, min relief. Has methocarbamol- 9 left from #20 on 06/01/20. Has used up to every 8 hours, but not daily.       History Patient Active Problem List   Diagnosis Date Noted  . Chest pain 05/31/2020  . Near syncope 05/29/2020  . Chest pain, rule out acute myocardial infarction 05/29/2020  . Chronic diastolic  CHF (congestive heart failure) ([place]) 05/09/2019  . GERD (gastroesophageal reflux disease) 05/09/2019  . Alcohol abuse 05/09/2019  . Hypertension 05/08/2019  . Bilateral low back pain with bilateral sciatica 01/05/2019  . Cyst of ovary   . Pre-operative cardiovascular examination 06/13/2018  . Dizziness 02/24/2018  . Left-sided weakness 02/24/2018  . Cigarette smoker 12/31/2017  . DOE (dyspnea on exertion) 12/30/2017  . Phlebitis after infusion 10/28/2017  . Hypertensive emergency 10/21/2017  . Microcytic anemia 10/21/2017  . Medication  intolerance 09/06/2017  . Neuroleptic-induced tardive dyskinesia 09/06/2017  . Cancer ([place]) 09/06/2017  . Chronic low back pain 09/06/2017  . Grief reaction with prolonged bereavement 09/06/2017  . Opioid use disorder, severe, dependence ([place]) 08/27/2017  . Alcohol use disorder, severe, dependence ([place]) 08/27/2017  . Substance induced mood disorder ([place]) 08/13/2017  . Acute kidney injury superimposed on CKD ([place])   . MDD (major depressive disorder), recurrent severe, without psychosis ([place])   . Alcohol withdrawal ([place]) 08/09/2017  . Essential hypertension 07/06/2017  . Chronic kidney disease, stage III (moderate) ([place]) 11/17/2016  . Depression with anxiety 08/21/2016  . Hx of bipolar disorder 01/31/2016  . History of breast cancer 02/13/2013  . Morbid obesity due to excess calories ([place]) 02/13/2013  . Hepatitis C virus infection cured after antiviral drug therapy 12/17/2012   Past Medical History:  Diagnosis Date  . Anxiety   . Bipolar and related disorder ([place])   . Blood transfusion without reported diagnosis   . Breast cancer ([place])    right lumpectemy and lymph node removal  . CHF (congestive heart failure) ([place])   . Chronic back pain   . Chronic kidney disease    "Stage IV" - states due to hypertension  . Depression   . Dyspnea   . GERD (gastroesophageal reflux disease)   . Hepatitis C   . Hypertension   . Left-sided weakness   . MRSA infection    of breast incision  . Opioid dependence ([place])    Has been treated in [facility] in past  . Substance abuse ([place])   . TIA (transient ischemic attack) 2020   P/w BP >230/120 and neurologic symptoms, presumed TIA   Past Surgical History:  Procedure Laterality Date  . BACK SURGERY  1990  . BREAST SURGERY Right 2010   "breast cancer survivor" - states partial mastectomy and nodes  . COLONOSCOPY     [facility]  . ESOPHAGOGASTRODUODENOSCOPY     [facility]  . ESOPHAGOGASTRODUODENOSCOPY  04/18/2020   [facility]  . LAPAROSCOPIC CHOLECYSTECTOMY  2002  . LAPAROSCOPIC INCISIONAL / UMBILICAL / VENTRAL HERNIA REPAIR  04/13/2018   with BARD 15x 73ZH mesh (supraumbilical)  . LAPAROSCOPIC LYSIS OF ADHESIONS  07/12/2018   Procedure: LAPAROSCOPIC LYSIS OF ADHESIONS;  Surgeon: Isabel Caprice, MD;  Location: [facility];  Service: Gynecology;;  . MYOMECTOMY     x 2 prior to hysterectomy  . ROBOTIC ASSISTED BILATERAL SALPINGO OOPHERECTOMY Right 07/12/2018   Procedure: XI ROBOTIC ASSISTED RIGHT SALPINGO OOPHORECTOMY;  Surgeon: Isabel Caprice, MD;  Location: [facility];  Service: Gynecology;  Laterality: Right;  . TOTAL ABDOMINAL HYSTERECTOMY     fibroids   . UPPER GASTROINTESTINAL ENDOSCOPY     Allergies  Allergen Reactions  . Abilify [Aripiprazole] Other (See Comments)    Tardive dyskinesia Oral  . Remeron [Mirtazapine] Other (See Comments)    Wgt stimulation /gain, Dizziness, Patient says "can tolerate"  . Trazodone And Nefazodone Other (See Comments)  Nightmares/sleep diturbance  . Flexeril [Cyclobenzaprine] Other (See Comments)    Pt states Flexeril makes her feel depressed   . Amoxicillin Diarrhea and Other (See Comments)    NOTE the patient has had PCN WITHOUT reaction Has patient had a PCN reaction causing immediate rash, facial/tongue/throat swelling, SOB or lightheadedness with hypotension: No Has patient had a PCN reaction causing severe rash involving mucus membranes or skin necrosis: No Has patient had a PCN reaction that required hospitalization: No Has patient had a PCN reaction occurring within the last 10 years: No If all of the above answers are "NO", then may proceed with Cephalosporin use.    Prior to Admission medications   Medication Sig Start Date End Date Taking? Authorizing Provider  acetaminophen (TYLENOL) 650 MG CR tablet Take 650 mg by mouth every 8 (eight) hours as needed for pain.   Yes [provider]  amLODipine (NORVASC) 10 MG tablet Take 1 tablet (10 mg  total) by mouth daily. 09/13/19  Yes Forrest Moron, MD  Cholecalciferol (VITAMIN D3) 50 MCG (2000 UT) capsule TAKE 1 CAPSULE BY MOUTH DAILY 06/18/20  Yes Wendie Agreste, MD  diphenhydramine-acetaminophen (TYLENOL PM) 25-500 MG TABS tablet Take 1 tablet by mouth at bedtime as needed.   Yes [provider]  ferrous sulfate (FEROSUL) 325 (65 FE) MG tablet Take 1 tablet (325 mg total) by mouth daily with breakfast. 06/18/20  Yes Wendie Agreste, MD  FLUoxetine (PROZAC) 40 MG capsule Take 40 mg by mouth daily.  11/09/18  Yes [provider]  gabapentin (NEURONTIN) 800 MG tablet Take 0.5 tablets (400 mg total) by mouth 3 (three) times daily. 06/01/20  Yes Aline August, MD  hydrALAZINE (APRESOLINE) 25 MG tablet Take 1 tablet (25 mg total) by mouth 3 (three) times daily. 05/11/19  Yes Danford, Suann Larry, MD  INGREZZA 80 MG CAPS Take 80 mg by mouth daily. 10/15/17  Yes [provider]  isosorbide mononitrate (IMDUR) 60 MG 24 hr tablet Take 1 tablet (60 mg total) by mouth daily. 06/18/20  Yes Wendie Agreste, MD  labetalol (NORMODYNE) 300 MG tablet Take 300 mg by mouth 2 (two) times daily.   Yes [provider]  meclizine (ANTIVERT) 25 MG tablet Take 1 tablet (25 mg total) by mouth 3 (three) times daily as needed for dizziness. 06/01/20  Yes Aline August, MD  methocarbamol (ROBAXIN) 500 MG tablet Take 1 tablet (500 mg total) by mouth every 6 (six) hours as needed for muscle spasms. 06/01/20  Yes Aline August, MD  pantoprazole (PROTONIX) 20 MG tablet Take 1 tablet (20 mg total) by mouth 2 (two) times daily. 06/01/20  Yes Aline August, MD  alum & mag hydroxide-simeth (MAALOX/MYLANTA) 200-200-20 MG/5ML suspension Take 15 mLs by mouth every 4 (four) hours as needed for indigestion or heartburn. Patient not taking: Reported on 06/20/2020 06/01/20   Aline August, MD  lidocaine (LIDODERM) 5 % Place 1 patch onto the skin at bedtime. Remove & Discard patch within 12  hours or as directed by MD Patient not taking: Reported on 06/20/2020 06/01/20   Aline August, MD  melatonin 5 MG TABS Take 5 mg by mouth at bedtime.    [provider]  ondansetron (ZOFRAN) 4 MG tablet TAKE 1 TABLET BY MOUTH EVERY 8 HOURS AS NEEDED FOR NAUSEA OR VOMITING Patient taking differently: Take 4 mg by mouth every 8 (eight) hours as needed for nausea or vomiting.  01/12/20   Wendie Agreste, MD  traMADol (  ULTRAM) 50 MG tablet Take 1 tablet (50 mg total) by mouth every 12 (twelve) hours as needed for moderate pain. Patient not taking: Reported on 06/20/2020 06/01/20   Aline August, MD   Social History   Socioeconomic History  . Marital status: Single    Spouse name: Not on file  . Number of children: 0  . Years of education: 45  . Highest education level: High school graduate  Occupational History  . Occupation: Disabled  Tobacco Use  . Smoking status: Current Every Day Smoker    Packs/day: 1.00    Types: Cigarettes  . Smokeless tobacco: Never Used  Vaping Use  . Vaping Use: Former  . Start date: 06/27/2013  . Quit date: 06/14/2017  . Devices: Apple Cinnamon-0 mg  Substance and Sexual Activity  . Alcohol use: No  . Drug use: Not Currently    Comment: h/o IVD use  . Sexual activity: Not Currently  Other Topics Concern  . Not on file  Social History Narrative   Lives at home alone.   Right-handed.   Caffeine use: 4 cups coffee/soda   Social Determinants of Health   Financial Resource Strain: Not on file  Food Insecurity: Not on file  Transportation Needs: Not on file  Physical Activity: Not on file  Stress: Not on file  Social Connections: Not on file  Intimate Partner Violence: Not on file    Review of Systems PER hpi.   Objective:   Vitals:   06/20/20 1132  BP: (!) 144/90  Pulse: 67  Temp: 97.8 F (36.6 C)  TempSrc: Temporal  SpO2: 98%  Weight: 230 lb (104.3 kg)  Height: 5\' 2"  (1.575 m)     Physical Exam Vitals reviewed.   Constitutional:      General: She is not in acute distress.    Appearance: Normal appearance. She is well-developed and well-nourished. She is not ill-appearing or diaphoretic.  HENT:     Head: Normocephalic and atraumatic.  Eyes:     Extraocular Movements: EOM normal.     Conjunctiva/sclera: Conjunctivae normal.     Pupils: Pupils are equal, round, and reactive to light.  Neck:     Vascular: No carotid bruit.  Cardiovascular:     Rate and Rhythm: Normal rate and regular rhythm.     Pulses: Intact distal pulses.     Heart sounds: Normal heart sounds.  Pulmonary:     Effort: Pulmonary effort is normal.     Breath sounds: Normal breath sounds.  Abdominal:     Palpations: Abdomen is soft. There is no pulsatile mass.     Tenderness: There is no abdominal tenderness.  Musculoskeletal:     Comments: Lumbar spine: ttp with spasm over the right lower paraspinals towards the sciatic notch.  Positive seated straight leg raise.   Skin:    General: Skin is warm and dry.  Neurological:     Mental Status: She is alert and oriented to person, place, and time.  Psychiatric:        Mood and Affect: Mood and affect and mood normal.        Behavior: Behavior normal.    39 minutes spent during visit, greater than 50% counseling and assimilation of information, chart review, and discussion of plan.    Assessment & Plan:  Nefertari Rebman is a 64 y.o. female . CKD (chronic kidney disease) stage 4, GFR 15-29 ml/min (HCC) - Plan: Basic metabolic panel  -Check renal function to make sure she  has returned to baseline.  Check CBC with history of anemia due to CKD.  Blood pressure borderline elevated, but higher readings previously.  No change in regimen for now.  Continue follow-up with nephrology.  Chest pain, unspecified type Near syncope  -Chest pain resolved, no further any syncopal symptoms.  Check labs as above with RTC precautions given, has follow-up planned with cardiology.  Chronic low back  pain, unspecified back pain laterality, unspecified whether sciatica present - Plan: methocarbamol (ROBAXIN) 500 MG tablet Right sided sciatica - Plan: methocarbamol (ROBAXIN) 500 MG tablet  -Followed by neurosurgeon as above with planned MRI the 21st.  Has had right-sided sciatica previously but has had some increased symptoms recently.  Possible component of spasm.  Refilled Robaxin temporarily, option of up to every 6 hours dosing but caution discussed with side effects and additive side effects of her other medications including gabapentin.  Understanding expressed..  Recommend she discuss with neurosurgeon if symptoms are not improving with this approach.  ER/RTC precautions  Anemia due to chronic kidney disease, unspecified CKD stage - Plan: CBC  -Check CBC.  Meds ordered this encounter  Medications  . methocarbamol (ROBAXIN) 500 MG tablet    Sig: Take 1 tablet (500 mg total) by mouth every 6 (six) hours as needed for muscle spasms.    Dispense:  20 tablet    Refill:  0   Patient Instructions    Call Dr. Arnoldo Morale about your back symptoms and leg symptoms to decide if any change needed prior to your MRI. Ok to continue gabapentin, try robaxin up to every 6 hours. If that makes that makes you more sleepy, decrease the dosing or stop that medicine.   I will check your kidney function and blood counts today.   Thanks for coming in to see [place] today.   Return to the clinic or go to the nearest emergency room if any of your symptoms worsen or new symptoms occur.      If you have lab work done today you will be contacted with your lab results within the next 2 weeks.  If you have not heard from [place] then please contact us. The fastest way to get your results is to register for My Chart.   IF you received an x-ray today, you will receive an invoice from [facility]. Please contact [facility] at [phone] with questions or concerns regarding your invoice.   IF you  received labwork today, you will receive an invoice from [facility]. Please contact [facility] at [phone] with questions or concerns regarding your invoice.   Our billing staff will not be able to assist you with questions regarding bills from these companies.  You will be contacted with the lab results as soon as they are available. The fastest way to get your results is to activate your My Chart account. Instructions are located on the last page of this paperwork. If you have not heard from [place] regarding the results in 2 weeks, please contact this office.         Signed, Merri Ray, MD [facility]

## 2020-06-20 NOTE — Patient Instructions (Addendum)
  Call Dr. Arnoldo Morale about your back symptoms and leg symptoms to decide if any change needed prior to your MRI. Ok to continue gabapentin, try robaxin up to every 6 hours. If that makes that makes you more sleepy, decrease the dosing or stop that medicine.   I will check your kidney function and blood counts today.   Thanks for coming in to see [place] today.   Return to the clinic or go to the nearest emergency room if any of your symptoms worsen or new symptoms occur.      If you have lab work done today you will be contacted with your lab results within the next 2 weeks.  If you have not heard from [place] then please contact us. The fastest way to get your results is to register for My Chart.   IF you received an x-ray today, you will receive an invoice from [facility]. Please contact [facility] at [phone] with questions or concerns regarding your invoice.   IF you received labwork today, you will receive an invoice from [facility]. Please contact [facility] at [phone] with questions or concerns regarding your invoice.   Our billing staff will not be able to assist you with questions regarding bills from these companies.  You will be contacted with the lab results as soon as they are available. The fastest way to get your results is to activate your My Chart account. Instructions are located on the last page of this paperwork. If you have not heard from [place] regarding the results in 2 weeks, please contact this office.

## 2020-06-21 ENCOUNTER — Ambulatory Visit (INDEPENDENT_AMBULATORY_CARE_PROVIDER_SITE_OTHER): Payer: Medicare HMO | Admitting: Behavioral Health

## 2020-06-21 DIAGNOSIS — F1121 Opioid dependence, in remission: Secondary | ICD-10-CM | POA: Diagnosis not present

## 2020-06-21 NOTE — Group Note (Signed)
Group Topic: Healthy Relationships  Group Date: 06/21/2020 Start Time:  9:00 AM End Time: 12:00 PM Facilitators: Allyne Gee, Counselor  Department: [facility]  Number of Participants: 4  Group Focus: co-dependency, coping skills, healthy friendships, relapse prevention, and self-esteem Treatment Modality:  Cognitive Behavioral Therapy Interventions utilized were clarification, confrontation, and exploration Purpose: enhance coping skills, explore maladaptive thinking, express feelings, and increase insight   Name: Sherry [name] Date of Birth: 17-Oct-1955  MR: [id number]    Level of Participation: active Quality of Participation: attentive and cooperative Interactions with others: gave feedback Mood/Affect: appropriate Triggers (if applicable): N/A Cognition: coherent/clear Progress: Moderate Response: Monaye checked-in by sharing that she has been in chronic back pain for the past week. She shared that she was unable to attend group on Monday and Wednesday due to being in physical pain. Cleda talked about past toxic relationships she has been involved in and the past. [name] provided insightful verbal feedback. Aprile was attentive and engaged in the group discussion about toxic relationships, how to identify them, and how to disengage in them.    Plan: follow-up needed  Patients Problems:  Patient Active Problem List   Diagnosis Date Noted  . Chest pain 05/31/2020  . Near syncope 05/29/2020  . Chest pain, rule out acute myocardial infarction 05/29/2020  . Chronic diastolic CHF (congestive heart failure) ([place]) 05/09/2019  . GERD (gastroesophageal reflux disease) 05/09/2019  . Alcohol abuse 05/09/2019  . Hypertension 05/08/2019  . Bilateral low back pain with bilateral sciatica 01/05/2019  . Cyst of ovary   . Pre-operative cardiovascular examination 06/13/2018  . Dizziness 02/24/2018  . Left-sided weakness 02/24/2018  . Cigarette smoker 12/31/2017   . DOE (dyspnea on exertion) 12/30/2017  . Phlebitis after infusion 10/28/2017  . Hypertensive emergency 10/21/2017  . Microcytic anemia 10/21/2017  . Medication intolerance 09/06/2017  . Neuroleptic-induced tardive dyskinesia 09/06/2017  . Cancer ([place]) 09/06/2017  . Chronic low back pain 09/06/2017  . Grief reaction with prolonged bereavement 09/06/2017  . Opioid use disorder, severe, dependence ([place]) 08/27/2017  . Alcohol use disorder, severe, dependence ([place]) 08/27/2017  . Substance induced mood disorder ([place]) 08/13/2017  . Acute kidney injury superimposed on CKD ([place])   . MDD (major depressive disorder), recurrent severe, without psychosis ([place])   . Alcohol withdrawal ([place]) 08/09/2017  . Essential hypertension 07/06/2017  . Chronic kidney disease, stage III (moderate) ([place]) 11/17/2016  . Depression with anxiety 08/21/2016  . Hx of bipolar disorder 01/31/2016  . History of breast cancer 02/13/2013  . Morbid obesity due to excess calories ([place]) 02/13/2013  . Hepatitis C virus infection cured after antiviral drug therapy 12/17/2012     Family Program: Family present? No   Name of family member(s): N/A  UDS collected: No Results: N/A  AA/NA attended?: Yes  Sponsor?: Yes

## 2020-06-21 NOTE — Telephone Encounter (Signed)
Left vm to call back

## 2020-06-24 ENCOUNTER — Other Ambulatory Visit (HOSPITAL_COMMUNITY): Payer: Self-pay | Admitting: *Deleted

## 2020-06-24 ENCOUNTER — Ambulatory Visit (HOSPITAL_COMMUNITY): Payer: Self-pay | Admitting: Behavioral Health

## 2020-06-25 ENCOUNTER — Other Ambulatory Visit: Payer: Self-pay

## 2020-06-25 ENCOUNTER — Encounter (HOSPITAL_COMMUNITY)
Admission: RE | Admit: 2020-06-25 | Discharge: 2020-06-25 | Disposition: A | Discharge status: Home / Self Care | Payer: Medicare HMO | Source: Ambulatory Visit | Attending: Nephrology | Admitting: Nephrology

## 2020-06-25 DIAGNOSIS — I5032 Chronic diastolic (congestive) heart failure: Secondary | ICD-10-CM | POA: Diagnosis not present

## 2020-06-25 DIAGNOSIS — I0981 Rheumatic heart failure: Secondary | ICD-10-CM | POA: Diagnosis not present

## 2020-06-25 DIAGNOSIS — G2401 Drug induced subacute dyskinesia: Secondary | ICD-10-CM | POA: Diagnosis not present

## 2020-06-25 DIAGNOSIS — N1832 Chronic kidney disease, stage 3b: Secondary | ICD-10-CM | POA: Diagnosis not present

## 2020-06-25 DIAGNOSIS — D631 Anemia in chronic kidney disease: Secondary | ICD-10-CM | POA: Diagnosis not present

## 2020-06-25 DIAGNOSIS — N184 Chronic kidney disease, stage 4 (severe): Secondary | ICD-10-CM | POA: Insufficient documentation

## 2020-06-25 DIAGNOSIS — I059 Rheumatic mitral valve disease, unspecified: Secondary | ICD-10-CM | POA: Diagnosis not present

## 2020-06-25 DIAGNOSIS — M549 Dorsalgia, unspecified: Secondary | ICD-10-CM | POA: Diagnosis not present

## 2020-06-25 DIAGNOSIS — F17200 Nicotine dependence, unspecified, uncomplicated: Secondary | ICD-10-CM | POA: Diagnosis not present

## 2020-06-25 DIAGNOSIS — G8929 Other chronic pain: Secondary | ICD-10-CM | POA: Diagnosis not present

## 2020-06-25 DIAGNOSIS — I130 Hypertensive heart and chronic kidney disease with heart failure and stage 1 through stage 4 chronic kidney disease, or unspecified chronic kidney disease: Secondary | ICD-10-CM | POA: Diagnosis not present

## 2020-06-25 MED ORDER — SODIUM CHLORIDE 0.9 % IV SOLN
510.0000 mg | INTRAVENOUS | Status: DC
  Administered 2020-06-25: 510 mg via INTRAVENOUS
  Filled 2020-06-25: qty 17

## 2020-06-26 ENCOUNTER — Ambulatory Visit (INDEPENDENT_AMBULATORY_CARE_PROVIDER_SITE_OTHER): Payer: Medicare HMO | Admitting: Behavioral Health

## 2020-06-26 ENCOUNTER — Ambulatory Visit: Payer: Medicare HMO | Admitting: Gastroenterology

## 2020-06-26 DIAGNOSIS — F1120 Opioid dependence, uncomplicated: Secondary | ICD-10-CM

## 2020-06-27 ENCOUNTER — Ambulatory Visit: Payer: Medicare HMO

## 2020-06-27 DIAGNOSIS — I059 Rheumatic mitral valve disease, unspecified: Secondary | ICD-10-CM | POA: Diagnosis not present

## 2020-06-27 DIAGNOSIS — N1832 Chronic kidney disease, stage 3b: Secondary | ICD-10-CM | POA: Diagnosis not present

## 2020-06-27 DIAGNOSIS — F17200 Nicotine dependence, unspecified, uncomplicated: Secondary | ICD-10-CM | POA: Diagnosis not present

## 2020-06-27 DIAGNOSIS — G8929 Other chronic pain: Secondary | ICD-10-CM | POA: Diagnosis not present

## 2020-06-27 DIAGNOSIS — I130 Hypertensive heart and chronic kidney disease with heart failure and stage 1 through stage 4 chronic kidney disease, or unspecified chronic kidney disease: Secondary | ICD-10-CM | POA: Diagnosis not present

## 2020-06-27 DIAGNOSIS — M549 Dorsalgia, unspecified: Secondary | ICD-10-CM | POA: Diagnosis not present

## 2020-06-27 DIAGNOSIS — I0981 Rheumatic heart failure: Secondary | ICD-10-CM | POA: Diagnosis not present

## 2020-06-27 DIAGNOSIS — G2401 Drug induced subacute dyskinesia: Secondary | ICD-10-CM | POA: Diagnosis not present

## 2020-06-27 DIAGNOSIS — I5032 Chronic diastolic (congestive) heart failure: Secondary | ICD-10-CM | POA: Diagnosis not present

## 2020-06-28 ENCOUNTER — Ambulatory Visit (HOSPITAL_COMMUNITY): Payer: Self-pay

## 2020-07-01 ENCOUNTER — Encounter (HOSPITAL_COMMUNITY): Payer: Self-pay | Admitting: Medical

## 2020-07-01 ENCOUNTER — Other Ambulatory Visit: Payer: Self-pay

## 2020-07-01 ENCOUNTER — Ambulatory Visit (HOSPITAL_COMMUNITY): Payer: Medicare HMO | Admitting: Behavioral Health

## 2020-07-01 DIAGNOSIS — I10 Essential (primary) hypertension: Secondary | ICD-10-CM

## 2020-07-01 DIAGNOSIS — G8929 Other chronic pain: Secondary | ICD-10-CM

## 2020-07-01 DIAGNOSIS — F1021 Alcohol dependence, in remission: Secondary | ICD-10-CM

## 2020-07-01 DIAGNOSIS — F1210 Cannabis abuse, uncomplicated: Secondary | ICD-10-CM

## 2020-07-01 DIAGNOSIS — F4381 Prolonged grief disorder: Secondary | ICD-10-CM

## 2020-07-01 DIAGNOSIS — F1121 Opioid dependence, in remission: Secondary | ICD-10-CM

## 2020-07-01 DIAGNOSIS — K2100 Gastro-esophageal reflux disease with esophagitis, without bleeding: Secondary | ICD-10-CM

## 2020-07-01 DIAGNOSIS — Z87898 Personal history of other specified conditions: Secondary | ICD-10-CM

## 2020-07-01 DIAGNOSIS — F17210 Nicotine dependence, cigarettes, uncomplicated: Secondary | ICD-10-CM

## 2020-07-01 DIAGNOSIS — I5032 Chronic diastolic (congestive) heart failure: Secondary | ICD-10-CM

## 2020-07-01 DIAGNOSIS — Z853 Personal history of malignant neoplasm of breast: Secondary | ICD-10-CM

## 2020-07-01 DIAGNOSIS — N184 Chronic kidney disease, stage 4 (severe): Secondary | ICD-10-CM

## 2020-07-01 DIAGNOSIS — F418 Other specified anxiety disorders: Secondary | ICD-10-CM

## 2020-07-01 DIAGNOSIS — F4329 Adjustment disorder with other symptoms: Secondary | ICD-10-CM

## 2020-07-01 MED ORDER — DIAZEPAM 10 MG PO TABS
ORAL_TABLET | ORAL | 0 refills | Status: DC
Start: 2020-07-01 — End: 2020-07-25

## 2020-07-01 MED ORDER — DOXEPIN HCL 50 MG PO CAPS: ORAL_CAPSULE | ORAL | 2 refills | Status: DC

## 2020-07-01 MED ORDER — DOXEPIN HCL 50 MG PO CAPS: 50.0000 mg | ORAL_CAPSULE | Freq: Every day | ORAL | 2 refills | Status: DC

## 2020-07-01 NOTE — Progress Notes (Addendum)
    Health Follow-up Outpatient CDIOP Date:   Admission Date:  Sobriety date:  Subjective:   HPI   Review of Systems: Psychiatric: Agitation: No Hallucination: No Depressed Mood: Yes much better Insomnia: No Hypersomnia: No Altered Concentration: No Feels Worthless: No Grandiose Ideas: No Belief In Special Powers: No New/Increased Substance Abuse: No Compulsions: No  Neurologic: Headache: No Seizure: No Paresthesias: No  Current Medications:   Vital Signs  Mental Status Examination  Appearance: Alert: Yes Attention: good  Cooperative: Yes Eye Contact: Good Speech: Clear and coherent Psychomotor Activity: Normal Memory/Concentration: Normal/intact Oriented: person, place, time/date and situation Mood: Euthymic Affect: Appropriate and Congruent Thought Processes and Associations: Coherent and Intact Fund of Knowledge: Good Thought Content: WDL Insight: Good Judgement: Good  UDS:  PDMP:  Diagnosis:   Assessment:  Treatment Plan: Darlyne Russian, PA-CPatient ID: Sherry [name], female   DOB: 20-Apr-1956, 64 y.o.   MRN: [id number]

## 2020-07-02 ENCOUNTER — Ambulatory Visit
Admission: RE | Admit: 2020-07-02 | Discharge: 2020-07-02 | Disposition: A | Discharge status: Home / Self Care | Payer: Medicare HMO | Source: Ambulatory Visit | Attending: Neurosurgery | Admitting: Neurosurgery

## 2020-07-02 ENCOUNTER — Inpatient Hospital Stay (HOSPITAL_COMMUNITY): Admission: RE | Admit: 2020-07-02 | Payer: Medicare HMO | Source: Ambulatory Visit

## 2020-07-02 DIAGNOSIS — M5441 Lumbago with sciatica, right side: Secondary | ICD-10-CM

## 2020-07-02 DIAGNOSIS — M4802 Spinal stenosis, cervical region: Secondary | ICD-10-CM | POA: Diagnosis not present

## 2020-07-02 DIAGNOSIS — M48061 Spinal stenosis, lumbar region without neurogenic claudication: Secondary | ICD-10-CM | POA: Diagnosis not present

## 2020-07-02 DIAGNOSIS — M545 Low back pain, unspecified: Secondary | ICD-10-CM | POA: Diagnosis not present

## 2020-07-02 DIAGNOSIS — G8929 Other chronic pain: Secondary | ICD-10-CM

## 2020-07-02 DIAGNOSIS — M542 Cervicalgia: Secondary | ICD-10-CM

## 2020-07-03 ENCOUNTER — Ambulatory Visit (HOSPITAL_COMMUNITY): Payer: Self-pay

## 2020-07-04 DIAGNOSIS — G8929 Other chronic pain: Secondary | ICD-10-CM | POA: Diagnosis not present

## 2020-07-04 DIAGNOSIS — I0981 Rheumatic heart failure: Secondary | ICD-10-CM | POA: Diagnosis not present

## 2020-07-04 DIAGNOSIS — I059 Rheumatic mitral valve disease, unspecified: Secondary | ICD-10-CM | POA: Diagnosis not present

## 2020-07-04 DIAGNOSIS — I130 Hypertensive heart and chronic kidney disease with heart failure and stage 1 through stage 4 chronic kidney disease, or unspecified chronic kidney disease: Secondary | ICD-10-CM | POA: Diagnosis not present

## 2020-07-04 DIAGNOSIS — G2401 Drug induced subacute dyskinesia: Secondary | ICD-10-CM | POA: Diagnosis not present

## 2020-07-04 DIAGNOSIS — F17200 Nicotine dependence, unspecified, uncomplicated: Secondary | ICD-10-CM | POA: Diagnosis not present

## 2020-07-04 DIAGNOSIS — M549 Dorsalgia, unspecified: Secondary | ICD-10-CM | POA: Diagnosis not present

## 2020-07-04 DIAGNOSIS — N1832 Chronic kidney disease, stage 3b: Secondary | ICD-10-CM | POA: Diagnosis not present

## 2020-07-04 DIAGNOSIS — I5032 Chronic diastolic (congestive) heart failure: Secondary | ICD-10-CM | POA: Diagnosis not present

## 2020-07-08 ENCOUNTER — Ambulatory Visit (HOSPITAL_COMMUNITY): Payer: Self-pay

## 2020-07-09 ENCOUNTER — Other Ambulatory Visit: Payer: Self-pay

## 2020-07-09 ENCOUNTER — Encounter (HOSPITAL_BASED_OUTPATIENT_CLINIC_OR_DEPARTMENT_OTHER): Payer: Self-pay | Admitting: *Deleted

## 2020-07-09 ENCOUNTER — Emergency Department (HOSPITAL_BASED_OUTPATIENT_CLINIC_OR_DEPARTMENT_OTHER)
Admission: EM | Admit: 2020-07-09 | Discharge: 2020-07-10 | Disposition: A | Discharge status: Home / Self Care | Payer: Medicare HMO | Attending: Emergency Medicine | Admitting: Emergency Medicine

## 2020-07-09 ENCOUNTER — Encounter (HOSPITAL_COMMUNITY): Payer: Self-pay

## 2020-07-09 ENCOUNTER — Inpatient Hospital Stay (HOSPITAL_COMMUNITY)
Admission: RE | Admit: 2020-07-09 | Discharge: 2020-07-09 | Disposition: A | Discharge status: Home / Self Care | Payer: Medicare HMO | Source: Ambulatory Visit | Attending: Nephrology | Admitting: Nephrology

## 2020-07-09 DIAGNOSIS — I130 Hypertensive heart and chronic kidney disease with heart failure and stage 1 through stage 4 chronic kidney disease, or unspecified chronic kidney disease: Secondary | ICD-10-CM | POA: Diagnosis not present

## 2020-07-09 DIAGNOSIS — G2401 Drug induced subacute dyskinesia: Secondary | ICD-10-CM | POA: Diagnosis not present

## 2020-07-09 DIAGNOSIS — I5032 Chronic diastolic (congestive) heart failure: Secondary | ICD-10-CM | POA: Insufficient documentation

## 2020-07-09 DIAGNOSIS — Z8673 Personal history of transient ischemic attack (TIA), and cerebral infarction without residual deficits: Secondary | ICD-10-CM | POA: Diagnosis not present

## 2020-07-09 DIAGNOSIS — F17210 Nicotine dependence, cigarettes, uncomplicated: Secondary | ICD-10-CM | POA: Diagnosis not present

## 2020-07-09 DIAGNOSIS — M545 Low back pain, unspecified: Secondary | ICD-10-CM | POA: Diagnosis not present

## 2020-07-09 DIAGNOSIS — G8929 Other chronic pain: Secondary | ICD-10-CM | POA: Diagnosis not present

## 2020-07-09 DIAGNOSIS — N1832 Chronic kidney disease, stage 3b: Secondary | ICD-10-CM | POA: Diagnosis not present

## 2020-07-09 DIAGNOSIS — I059 Rheumatic mitral valve disease, unspecified: Secondary | ICD-10-CM | POA: Diagnosis not present

## 2020-07-09 DIAGNOSIS — I0981 Rheumatic heart failure: Secondary | ICD-10-CM | POA: Diagnosis not present

## 2020-07-09 DIAGNOSIS — Z853 Personal history of malignant neoplasm of breast: Secondary | ICD-10-CM | POA: Insufficient documentation

## 2020-07-09 DIAGNOSIS — M549 Dorsalgia, unspecified: Secondary | ICD-10-CM | POA: Diagnosis not present

## 2020-07-09 DIAGNOSIS — N183 Chronic kidney disease, stage 3 unspecified: Secondary | ICD-10-CM | POA: Insufficient documentation

## 2020-07-09 DIAGNOSIS — F17200 Nicotine dependence, unspecified, uncomplicated: Secondary | ICD-10-CM | POA: Diagnosis not present

## 2020-07-09 DIAGNOSIS — M5459 Other low back pain: Secondary | ICD-10-CM | POA: Diagnosis not present

## 2020-07-09 NOTE — ED Triage Notes (Signed)
Chronic back pain. She had an MRI last week. She does not have the results. Here today with increased back pain x 3 days.

## 2020-07-09 NOTE — ED Notes (Signed)
Pt. Reports approx. 3 days ago she started having pain in the lower back and down the R hip and R leg.  Pt. Reports it burns down R calf and the R foot.  No numbness or tingling per Pt.  Able to [facility] and [facility] when asked.  No sleeping per Pt. Due to the pain.

## 2020-07-10 ENCOUNTER — Telehealth: Payer: Self-pay | Admitting: Family Medicine

## 2020-07-10 ENCOUNTER — Ambulatory Visit (HOSPITAL_COMMUNITY): Payer: Self-pay | Admitting: Behavioral Health

## 2020-07-10 DIAGNOSIS — I0981 Rheumatic heart failure: Secondary | ICD-10-CM | POA: Diagnosis not present

## 2020-07-10 DIAGNOSIS — G8929 Other chronic pain: Secondary | ICD-10-CM | POA: Diagnosis not present

## 2020-07-10 DIAGNOSIS — M545 Low back pain, unspecified: Secondary | ICD-10-CM | POA: Diagnosis not present

## 2020-07-10 DIAGNOSIS — G2401 Drug induced subacute dyskinesia: Secondary | ICD-10-CM | POA: Diagnosis not present

## 2020-07-10 DIAGNOSIS — F17200 Nicotine dependence, unspecified, uncomplicated: Secondary | ICD-10-CM | POA: Diagnosis not present

## 2020-07-10 DIAGNOSIS — M549 Dorsalgia, unspecified: Secondary | ICD-10-CM | POA: Diagnosis not present

## 2020-07-10 DIAGNOSIS — I059 Rheumatic mitral valve disease, unspecified: Secondary | ICD-10-CM | POA: Diagnosis not present

## 2020-07-10 DIAGNOSIS — N1832 Chronic kidney disease, stage 3b: Secondary | ICD-10-CM | POA: Diagnosis not present

## 2020-07-10 DIAGNOSIS — I130 Hypertensive heart and chronic kidney disease with heart failure and stage 1 through stage 4 chronic kidney disease, or unspecified chronic kidney disease: Secondary | ICD-10-CM | POA: Diagnosis not present

## 2020-07-10 DIAGNOSIS — I5032 Chronic diastolic (congestive) heart failure: Secondary | ICD-10-CM | POA: Diagnosis not present

## 2020-07-10 LAB — URINALYSIS, ROUTINE W REFLEX MICROSCOPIC
Bilirubin Urine: NEGATIVE
Glucose, UA: NEGATIVE mg/dL
Hgb urine dipstick: NEGATIVE
Ketones, ur: NEGATIVE mg/dL
Leukocytes,Ua: NEGATIVE
Nitrite: NEGATIVE
Protein, ur: >300 mg/dL — AB
Specific Gravity, Urine: 1.02 (ref 1.005–1.030)
pH: 5 (ref 5.0–8.0)

## 2020-07-10 LAB — URINALYSIS, MICROSCOPIC (REFLEX)

## 2020-07-10 MED ORDER — KETOROLAC TROMETHAMINE 30 MG/ML IJ SOLN
30.0000 mg | Freq: Once | INTRAMUSCULAR | Status: AC
  Administered 2020-07-10: 30 mg via INTRAMUSCULAR
  Filled 2020-07-10: qty 1

## 2020-07-10 NOTE — Telephone Encounter (Signed)
Sherry [name] calling from [facility] wanting to let Provider know that patient went to ER last night for severe back pain/ possible muscle spasm or problem with her disc /

## 2020-07-10 NOTE — ED Provider Notes (Signed)
Emergency Department Provider Note   I have reviewed the triage vital signs and the nursing notes.   HISTORY  Chief Complaint Back Pain   HPI Sherry [name] is a 63 y.o. female with past medical history reviewed below presents to the emergency department with back pain.  Patient describes acute on chronic back pain without numbness or weakness.  No bowel or bladder symptoms.  No fevers or chills.  She had MRI scans performed earlier in the month but is unsure of the results.  She is currently following with spine surgery with an appointment in the coming week.  She describes intractable pain at home.  No new injury.  No radiation of symptoms or other modifying factors other than movement.    Past Medical History:  Diagnosis Date  . Anxiety   . Bipolar and related disorder ([place])   . Blood transfusion without reported diagnosis   . Breast cancer ([place])    right lumpectemy and lymph node removal  . CHF (congestive heart failure) ([place])   . Chronic back pain   . Chronic kidney disease    "Stage IV" - states due to hypertension  . Depression   . Dyspnea   . GERD (gastroesophageal reflux disease)   . Hepatitis C   . Hypertension   . Left-sided weakness   . MRSA infection    of breast incision  . Opioid dependence ([place])    Has been treated in [facility] in past  . Substance abuse ([place])   . TIA (transient ischemic attack) 2020   P/w BP >230/120 and neurologic symptoms, presumed TIA    Patient Active Problem List   Diagnosis Date Noted  . Chest pain 05/31/2020  . Near syncope 05/29/2020  . Chest pain, rule out acute myocardial infarction 05/29/2020  . Chronic diastolic CHF (congestive heart failure) ([place]) 05/09/2019  . GERD (gastroesophageal reflux disease) 05/09/2019  . Alcohol abuse 05/09/2019  . Hypertension 05/08/2019  . Bilateral low back pain with bilateral sciatica 01/05/2019  . Cyst of ovary   . Pre-operative cardiovascular examination 06/13/2018  . Dizziness  02/24/2018  . Left-sided weakness 02/24/2018  . Cigarette smoker 12/31/2017  . DOE (dyspnea on exertion) 12/30/2017  . Phlebitis after infusion 10/28/2017  . Hypertensive emergency 10/21/2017  . Microcytic anemia 10/21/2017  . Medication intolerance 09/06/2017  . Neuroleptic-induced tardive dyskinesia 09/06/2017  . Cancer ([place]) 09/06/2017  . Chronic low back pain 09/06/2017  . Grief reaction with prolonged bereavement 09/06/2017  . Opioid use disorder, severe, dependence ([place]) 08/27/2017  . Alcohol use disorder, severe, dependence ([place]) 08/27/2017  . Substance induced mood disorder ([place]) 08/13/2017  . Acute kidney injury superimposed on CKD ([place])   . MDD (major depressive disorder), recurrent severe, without psychosis ([place])   . Alcohol withdrawal ([place]) 08/09/2017  . Essential hypertension 07/06/2017  . Chronic kidney disease, stage III (moderate) ([place]) 11/17/2016  . Depression with anxiety 08/21/2016  . Hx of bipolar disorder 01/31/2016  . History of breast cancer 02/13/2013  . Morbid obesity due to excess calories ([place]) 02/13/2013  . Hepatitis C virus infection cured after antiviral drug therapy 12/17/2012    Past Surgical History:  Procedure Laterality Date  . BACK SURGERY  1990  . BREAST SURGERY Right 2010   "breast cancer survivor" - states partial mastectomy and nodes  . COLONOSCOPY     [facility]  . ESOPHAGOGASTRODUODENOSCOPY     [facility]  . ESOPHAGOGASTRODUODENOSCOPY  04/18/2020   [facility]  .  LAPAROSCOPIC CHOLECYSTECTOMY  2002  . LAPAROSCOPIC INCISIONAL / UMBILICAL / VENTRAL HERNIA REPAIR  04/13/2018   with BARD 15x 16XW mesh (supraumbilical)  . LAPAROSCOPIC LYSIS OF ADHESIONS  07/12/2018   Procedure: LAPAROSCOPIC LYSIS OF ADHESIONS;  Surgeon: Isabel Caprice, MD;  Location: [facility];  Service: Gynecology;;  . MYOMECTOMY     x 2 prior to hysterectomy  . ROBOTIC ASSISTED BILATERAL SALPINGO OOPHERECTOMY Right 07/12/2018   Procedure: XI ROBOTIC  ASSISTED RIGHT SALPINGO OOPHORECTOMY;  Surgeon: Isabel Caprice, MD;  Location: [facility];  Service: Gynecology;  Laterality: Right;  . TOTAL ABDOMINAL HYSTERECTOMY     fibroids   . UPPER GASTROINTESTINAL ENDOSCOPY      Allergies Abilify [aripiprazole], Remeron [mirtazapine], Trazodone and nefazodone, Flexeril [cyclobenzaprine], and Amoxicillin  Family History  Problem Relation Age of Onset  . Heart attack Mother   . Breast cancer Mother 71  . Dementia Mother   . Cancer Father 61       died of bleeding from kidneys  . Colon cancer Neg Hx   . Esophageal cancer Neg Hx   . Stomach cancer Neg Hx   . Rectal cancer Neg Hx     Social History Social History   Tobacco Use  . Smoking status: Current Every Day Smoker    Packs/day: 1.00    Types: Cigarettes  . Smokeless tobacco: Never Used  Vaping Use  . Vaping Use: Former  . Start date: 06/27/2013  . Quit date: 06/14/2017  . Devices: Apple Cinnamon-0 mg  Substance Use Topics  . Alcohol use: No  . Drug use: Not Currently    Comment: h/o IVD use    Review of Systems  Constitutional: No fever/chills Eyes: No visual changes. ENT: No sore throat. Cardiovascular: Denies chest pain. Respiratory: Denies shortness of breath. Gastrointestinal: No abdominal pain.  No nausea, no vomiting.  No diarrhea.  No constipation. Genitourinary: Negative for dysuria. Musculoskeletal: Positive for back pain. Skin: Negative for rash. Neurological: Negative for headaches, focal weakness or numbness.  10-point ROS otherwise negative.  ____________________________________________   PHYSICAL EXAM:  VITAL SIGNS: ED Triage Vitals  Enc Vitals Group     BP 07/09/20 1836 (!) 184/94     Pulse Rate 07/09/20 1834 83     Resp 07/09/20 1834 18     Temp 07/09/20 1834 98.4 F (36.9 C)     Temp Source 07/09/20 1834 Oral     SpO2 07/09/20 1834 100 %     Weight 07/09/20 1832 229 lb 15 oz (104.3 kg)     Height 07/09/20 1832 5\' 2"  (1.575 m)    Constitutional: Alert and oriented. Well appearing and in no acute distress. Eyes: Conjunctivae are normal.  Head: Atraumatic. Nose: No congestion/rhinnorhea. Mouth/Throat: Mucous membranes are moist.  Neck: No stridor.   Cardiovascular: Normal rate, regular rhythm. Good peripheral circulation. Grossly normal heart sounds.   Respiratory: Normal respiratory effort.  No retractions. Lungs CTAB. Gastrointestinal: Soft and nontender. No distention.  Musculoskeletal: No lower extremity tenderness nor edema. No gross deformities of extremities.  Neurologic:  Normal speech and language. No gross focal neurologic deficits are appreciated. Normal patellar reflexes bilaterally. 5/5 strength and sensation in the B/L LEs.  Skin:  Skin is warm, dry and intact. No rash noted.  ____________________________________________   LABS (all labs ordered are listed, but only abnormal results are displayed)  Labs Reviewed  URINALYSIS, ROUTINE W REFLEX MICROSCOPIC - Abnormal; Notable for the following components:      Result Value  Protein, ur >300 (*)    All other components within normal limits  URINALYSIS, MICROSCOPIC (REFLEX) - Abnormal; Notable for the following components:   Bacteria, UA FEW (*)    All other components within normal limits   ____________________________________________   PROCEDURES  Procedure(s) performed:   Procedures  None  ____________________________________________   INITIAL IMPRESSION / ASSESSMENT AND PLAN / ED COURSE  Pertinent labs & imaging results that were available during my care of the patient were reviewed by me and considered in my medical decision making (see chart for details).   Patient presents emergency department with acute on chronic back pain.  No red flag signs or symptoms to suspect cauda equina, epidural abscess, spinal bleed or other spine emergency.  Patient had MRI imaging performed as an outpatient which was reviewed by me in the ED and  discussed with the patient.  She will keep her follow-up appointment with the neurosurgery team to help with the plan moving forward.  They not seen indication for additional imaging at this time.  Patient given Toradol here in the emergency department.  She is already on multiple pain medications at home.  I do not see indication for expanding her pain medication regimen from the emergency department. Discussed PCP and NSG follow up plan.    ____________________________________________  FINAL CLINICAL IMPRESSION(S) / ED DIAGNOSES  Final diagnoses:  Chronic midline low back pain without sciatica    MEDICATIONS GIVEN DURING THIS VISIT:  Medications  ketorolac (TORADOL) 30 MG/ML injection 30 mg (30 mg Intramuscular Given 07/10/20 0028)    Note:  This document was prepared using Dragon voice recognition software and may include unintentional dictation errors.  Nanda Quinton, MD, [facility] Emergency Medicine    Tienna Bienkowski, [facility], MD 07/16/20 [phone]

## 2020-07-10 NOTE — Discharge Instructions (Signed)

## 2020-07-10 NOTE — Telephone Encounter (Signed)
Pt went to the ER last night an FYI was sent to [place] if you want to review her visit summery.

## 2020-07-11 DIAGNOSIS — I0981 Rheumatic heart failure: Secondary | ICD-10-CM | POA: Diagnosis not present

## 2020-07-11 DIAGNOSIS — I130 Hypertensive heart and chronic kidney disease with heart failure and stage 1 through stage 4 chronic kidney disease, or unspecified chronic kidney disease: Secondary | ICD-10-CM | POA: Diagnosis not present

## 2020-07-11 DIAGNOSIS — I5032 Chronic diastolic (congestive) heart failure: Secondary | ICD-10-CM | POA: Diagnosis not present

## 2020-07-11 DIAGNOSIS — I059 Rheumatic mitral valve disease, unspecified: Secondary | ICD-10-CM | POA: Diagnosis not present

## 2020-07-11 DIAGNOSIS — G2401 Drug induced subacute dyskinesia: Secondary | ICD-10-CM | POA: Diagnosis not present

## 2020-07-11 DIAGNOSIS — G8929 Other chronic pain: Secondary | ICD-10-CM | POA: Diagnosis not present

## 2020-07-11 DIAGNOSIS — N1832 Chronic kidney disease, stage 3b: Secondary | ICD-10-CM | POA: Diagnosis not present

## 2020-07-11 DIAGNOSIS — F17200 Nicotine dependence, unspecified, uncomplicated: Secondary | ICD-10-CM | POA: Diagnosis not present

## 2020-07-11 DIAGNOSIS — M549 Dorsalgia, unspecified: Secondary | ICD-10-CM | POA: Diagnosis not present

## 2020-07-15 NOTE — Progress Notes (Signed)
    Daily Group Progress Note  Program: IOP   Group Time: 9:00AM  Participation Level: Minimal  Behavioral Response: Appropriate  Type of Therapy: Group Therapy  Topic: Guilt/Shame   Summary: Lilja checked-in by stating "I'm here" and sharing that she doesn't have a lot to share today. She reports she has been having chronic back pain and these has caused her to feel more depressed and irritable today. Clt listened attentively today during group discussion. Clt denied SI/HI/AVH on her daily inventory check-in sheet.   Family Program: Family present? No   Name of family member(s): N/A  UDS collected: No Results: N/A  AA/NA attended?: No  Sponsor?: No   Gray Maugeri S Julie-Ann Vanmaanen, Counselor

## 2020-07-18 DIAGNOSIS — G2401 Drug induced subacute dyskinesia: Secondary | ICD-10-CM | POA: Diagnosis not present

## 2020-07-18 DIAGNOSIS — I0981 Rheumatic heart failure: Secondary | ICD-10-CM | POA: Diagnosis not present

## 2020-07-18 DIAGNOSIS — F17200 Nicotine dependence, unspecified, uncomplicated: Secondary | ICD-10-CM | POA: Diagnosis not present

## 2020-07-18 DIAGNOSIS — M549 Dorsalgia, unspecified: Secondary | ICD-10-CM | POA: Diagnosis not present

## 2020-07-18 DIAGNOSIS — N1832 Chronic kidney disease, stage 3b: Secondary | ICD-10-CM | POA: Diagnosis not present

## 2020-07-18 DIAGNOSIS — I059 Rheumatic mitral valve disease, unspecified: Secondary | ICD-10-CM | POA: Diagnosis not present

## 2020-07-18 DIAGNOSIS — I5032 Chronic diastolic (congestive) heart failure: Secondary | ICD-10-CM | POA: Diagnosis not present

## 2020-07-18 DIAGNOSIS — I130 Hypertensive heart and chronic kidney disease with heart failure and stage 1 through stage 4 chronic kidney disease, or unspecified chronic kidney disease: Secondary | ICD-10-CM | POA: Diagnosis not present

## 2020-07-18 DIAGNOSIS — G8929 Other chronic pain: Secondary | ICD-10-CM | POA: Diagnosis not present

## 2020-07-19 ENCOUNTER — Ambulatory Visit (HOSPITAL_COMMUNITY): Payer: Medicare HMO

## 2020-07-19 DIAGNOSIS — Z6841 Body Mass Index (BMI) 40.0 and over, adult: Secondary | ICD-10-CM

## 2020-07-19 DIAGNOSIS — R6889 Other general symptoms and signs: Secondary | ICD-10-CM | POA: Diagnosis not present

## 2020-07-19 DIAGNOSIS — M4316 Spondylolisthesis, lumbar region: Secondary | ICD-10-CM | POA: Diagnosis not present

## 2020-07-19 HISTORY — DX: Body Mass Index (BMI) 40.0 and over, adult: Z684

## 2020-07-22 ENCOUNTER — Inpatient Hospital Stay (HOSPITAL_COMMUNITY): Admission: RE | Admit: 2020-07-22 | Payer: Medicare HMO | Source: Ambulatory Visit

## 2020-07-22 ENCOUNTER — Ambulatory Visit (HOSPITAL_COMMUNITY): Payer: Self-pay | Admitting: Behavioral Health

## 2020-07-22 ENCOUNTER — Other Ambulatory Visit: Payer: Self-pay | Admitting: Neurosurgery

## 2020-07-23 DIAGNOSIS — I0981 Rheumatic heart failure: Secondary | ICD-10-CM | POA: Diagnosis not present

## 2020-07-23 DIAGNOSIS — G8929 Other chronic pain: Secondary | ICD-10-CM | POA: Diagnosis not present

## 2020-07-23 DIAGNOSIS — N1832 Chronic kidney disease, stage 3b: Secondary | ICD-10-CM | POA: Diagnosis not present

## 2020-07-23 DIAGNOSIS — M549 Dorsalgia, unspecified: Secondary | ICD-10-CM | POA: Diagnosis not present

## 2020-07-23 DIAGNOSIS — F17200 Nicotine dependence, unspecified, uncomplicated: Secondary | ICD-10-CM | POA: Diagnosis not present

## 2020-07-23 DIAGNOSIS — I130 Hypertensive heart and chronic kidney disease with heart failure and stage 1 through stage 4 chronic kidney disease, or unspecified chronic kidney disease: Secondary | ICD-10-CM | POA: Diagnosis not present

## 2020-07-23 DIAGNOSIS — G2401 Drug induced subacute dyskinesia: Secondary | ICD-10-CM | POA: Diagnosis not present

## 2020-07-23 DIAGNOSIS — I059 Rheumatic mitral valve disease, unspecified: Secondary | ICD-10-CM | POA: Diagnosis not present

## 2020-07-23 DIAGNOSIS — I5032 Chronic diastolic (congestive) heart failure: Secondary | ICD-10-CM | POA: Diagnosis not present

## 2020-07-24 ENCOUNTER — Ambulatory Visit (HOSPITAL_COMMUNITY): Payer: Self-pay | Admitting: Behavioral Health

## 2020-07-24 DIAGNOSIS — R6889 Other general symptoms and signs: Secondary | ICD-10-CM | POA: Diagnosis not present

## 2020-07-25 ENCOUNTER — Ambulatory Visit (INDEPENDENT_AMBULATORY_CARE_PROVIDER_SITE_OTHER): Payer: Medicare HMO | Admitting: Family Medicine

## 2020-07-25 ENCOUNTER — Ambulatory Visit (INDEPENDENT_AMBULATORY_CARE_PROVIDER_SITE_OTHER): Payer: Medicare HMO

## 2020-07-25 ENCOUNTER — Other Ambulatory Visit: Payer: Self-pay

## 2020-07-25 ENCOUNTER — Encounter: Payer: Self-pay | Admitting: Family Medicine

## 2020-07-25 VITALS — BP 202/88 | HR 67 | Temp 97.3°F | Ht 62.0 in | Wt 234.2 lb

## 2020-07-25 DIAGNOSIS — Z01818 Encounter for other preprocedural examination: Secondary | ICD-10-CM

## 2020-07-25 DIAGNOSIS — N189 Chronic kidney disease, unspecified: Secondary | ICD-10-CM | POA: Diagnosis not present

## 2020-07-25 DIAGNOSIS — R6889 Other general symptoms and signs: Secondary | ICD-10-CM | POA: Diagnosis not present

## 2020-07-25 DIAGNOSIS — I10 Essential (primary) hypertension: Secondary | ICD-10-CM

## 2020-07-25 DIAGNOSIS — I5032 Chronic diastolic (congestive) heart failure: Secondary | ICD-10-CM | POA: Diagnosis not present

## 2020-07-25 DIAGNOSIS — N184 Chronic kidney disease, stage 4 (severe): Secondary | ICD-10-CM | POA: Diagnosis not present

## 2020-07-25 DIAGNOSIS — I517 Cardiomegaly: Secondary | ICD-10-CM | POA: Diagnosis not present

## 2020-07-25 DIAGNOSIS — D631 Anemia in chronic kidney disease: Secondary | ICD-10-CM | POA: Diagnosis not present

## 2020-07-25 NOTE — Patient Instructions (Addendum)
  You will need to meet with cardiology to discuss cardiac clearance for surgery. I have placed a referral.   Nephrology will also need to evaluate you for preop clearance as well with your blood pressure and chronic kidney disease. I will try to get you an appointment soon   Keep a record of your blood pressures outside of the office on your meds and if remains over 140/90 call your nephrologist to discuss changes.   Gabapentin should not be due for refill until February 8.  Let me know as you get closer to that time and we can look at refill at that time.  Return to the clinic or go to the nearest emergency room if any of your symptoms worsen or new symptoms occur.   If you have lab work done today you will be contacted with your lab results within the next 2 weeks.  If you have not heard from [place] then please contact us. The fastest way to get your results is to register for My Chart.   IF you received an x-ray today, you will receive an invoice from [facility]. Please contact [facility] at [phone] with questions or concerns regarding your invoice.   IF you received labwork today, you will receive an invoice from [facility]. Please contact [facility] at [phone] with questions or concerns regarding your invoice.   Our billing staff will not be able to assist you with questions regarding bills from these companies.  You will be contacted with the lab results as soon as they are available. The fastest way to get your results is to activate your My Chart account. Instructions are located on the last page of this paperwork. If you have not heard from [place] regarding the results in 2 weeks, please contact this office.

## 2020-07-25 NOTE — Progress Notes (Signed)
Subjective:  Patient ID  Sherry [name], female    DOB: [birth year]  Age: 65 y.o. MRN: [id number]  CC:  Chief Complaint  Patient presents with  . surgery clearance     Scheduled for 08/08/20   HPI Sherry [name] presents for   Surgical clearance.  Planned lumbar fusion on January 27th, Dr. Arnoldo Morale.  History of multiple medical problems including chronic diastolic CHF, chronic kidney disease, uncontrolled hypertension, anemia followed by nephrology Hx of opioid use disorder, depression. Chronic abdominal pain.  Uncontrolled blood pressure at last nephrology eval in November,, concern for noncompliance. Last seen by cardiology in November 2020, 65-month follow-up recommended.  Denies dypnea walking flight stairs. No dyspnea walking up and down street.  TIA in 2020 with hypertensive emergency.  Denies chest pain, dyspnea, or new focal weakness, no headache at this time.  Has not taken blood pressure meds yet today - takes with food.  Not measuring home BP readings.  Still taking gabapentin 800mg  tid. Not taking hydrocodone - had # 20 rx on 07/19/20. Not taking now. Denies other prescribers of gabapentin.  Pharmacy called - gabapentin 800mg  #270 filled 05/20/20 - Rx by Dr. Nolon Rod. Still has some at home.   BP Readings from Last 3 Encounters:  07/25/20 (!) 202/88  07/09/20 (!) 192/95  06/25/20 (!) 177/65    History Patient Active Problem List   Diagnosis Date Noted  . Chest pain 05/31/2020  . Near syncope 05/29/2020  . Chest pain, rule out acute myocardial infarction 05/29/2020  . Chronic diastolic CHF (congestive heart failure) ([place]) 05/09/2019  . GERD (gastroesophageal reflux disease) 05/09/2019  . Alcohol abuse 05/09/2019  . Hypertension 05/08/2019  . Bilateral low back pain with bilateral sciatica 01/05/2019  . Cyst of ovary   . Pre-operative cardiovascular examination 06/13/2018  . Dizziness 02/24/2018  . Left-sided weakness 02/24/2018  . Cigarette smoker 12/31/2017  . DOE  (dyspnea on exertion) 12/30/2017  . Phlebitis after infusion 10/28/2017  . Hypertensive emergency 10/21/2017  . Microcytic anemia 10/21/2017  . Medication intolerance 09/06/2017  . Neuroleptic-induced tardive dyskinesia 09/06/2017  . Cancer ([place]) 09/06/2017  . Chronic low back pain 09/06/2017  . Grief reaction with prolonged bereavement 09/06/2017  . Opioid use disorder, severe, dependence ([place]) 08/27/2017  . Alcohol use disorder, severe, dependence ([place]) 08/27/2017  . Substance induced mood disorder ([place]) 08/13/2017  . Acute kidney injury superimposed on CKD ([place])   . MDD (major depressive disorder), recurrent severe, without psychosis ([place])   . Alcohol withdrawal ([place]) 08/09/2017  . Essential hypertension 07/06/2017  . Chronic kidney disease, stage III (moderate) ([place]) 11/17/2016  . Depression with anxiety 08/21/2016  . Hx of bipolar disorder 01/31/2016  . History of breast cancer 02/13/2013  . Morbid obesity due to excess calories ([place]) 02/13/2013  . Hepatitis C virus infection cured after antiviral drug therapy 12/17/2012   Past Medical History:  Diagnosis Date  . Anxiety   . Bipolar and related disorder ([place])   . Blood transfusion without reported diagnosis   . Breast cancer ([place])    right lumpectemy and lymph node removal  . CHF (congestive heart failure) ([place])   . Chronic back pain   . Chronic kidney disease    "Stage IV" - states due to hypertension  . Depression   . Dyspnea   . GERD (gastroesophageal reflux disease)   . Hepatitis C   . Hypertension   . Left-sided weakness   . MRSA infection    of breast incision  .  Opioid dependence ([place])    Has been treated in [facility] in past  . Substance abuse ([place])   . TIA (transient ischemic attack) 2020   P/w BP >230/120 and neurologic symptoms, presumed TIA   Past Surgical History:  Procedure Laterality Date  . BACK SURGERY  1990  . BREAST SURGERY Right 2010   "breast cancer survivor" - states partial  mastectomy and nodes  . COLONOSCOPY     [facility]  . ESOPHAGOGASTRODUODENOSCOPY     [facility]  . ESOPHAGOGASTRODUODENOSCOPY  04/18/2020   [facility]  . LAPAROSCOPIC CHOLECYSTECTOMY  2002  . LAPAROSCOPIC INCISIONAL / UMBILICAL / VENTRAL HERNIA REPAIR  04/13/2018   with BARD 15x 87FI mesh (supraumbilical)  . LAPAROSCOPIC LYSIS OF ADHESIONS  07/12/2018   Procedure: LAPAROSCOPIC LYSIS OF ADHESIONS;  Surgeon: Isabel Caprice, MD;  Location: [facility];  Service: Gynecology;;  . MYOMECTOMY     x 2 prior to hysterectomy  . ROBOTIC ASSISTED BILATERAL SALPINGO OOPHERECTOMY Right 07/12/2018   Procedure: XI ROBOTIC ASSISTED RIGHT SALPINGO OOPHORECTOMY;  Surgeon: Isabel Caprice, MD;  Location: [facility];  Service: Gynecology;  Laterality: Right;  . TOTAL ABDOMINAL HYSTERECTOMY     fibroids   . UPPER GASTROINTESTINAL ENDOSCOPY     Allergies  Allergen Reactions  . Abilify [Aripiprazole] Other (See Comments)    Tardive dyskinesia Oral  . Remeron [Mirtazapine] Other (See Comments)    Wgt stimulation /gain, Dizziness, Patient says "can tolerate"  . Trazodone And Nefazodone Other (See Comments)    Nightmares/sleep diturbance  . Flexeril [Cyclobenzaprine] Other (See Comments)    Pt states Flexeril makes her feel depressed   . Amoxicillin Diarrhea and Other (See Comments)    NOTE the patient has had PCN WITHOUT reaction Has patient had a PCN reaction causing immediate rash, facial/tongue/throat swelling, SOB or lightheadedness with hypotension: No Has patient had a PCN reaction causing severe rash involving mucus membranes or skin necrosis: No Has patient had a PCN reaction that required hospitalization: No Has patient had a PCN reaction occurring within the last 10 years: No If all of the above answers are "NO", then may proceed with Cephalosporin use.    Prior to Admission medications   Medication Sig Start Date End Date Taking? Authorizing Provider  amLODipine (NORVASC) 10 MG  tablet Take 1 tablet (10 mg total) by mouth daily. 09/13/19  Yes Forrest Moron, MD  Cholecalciferol (VITAMIN D3) 50 MCG (2000 UT) capsule TAKE 1 CAPSULE BY MOUTH DAILY 06/18/20  Yes Wendie Agreste, MD  diphenhydramine-acetaminophen (TYLENOL PM) 25-500 MG TABS tablet Take 1 tablet by mouth at bedtime as needed.   Yes [provider]  ferrous sulfate (FEROSUL) 325 (65 FE) MG tablet Take 1 tablet (325 mg total) by mouth daily with breakfast. 06/18/20  Yes Wendie Agreste, MD  FLUoxetine (PROZAC) 40 MG capsule Take 40 mg by mouth daily.  11/09/18  Yes [provider]  gabapentin (NEURONTIN) 800 MG tablet Take 0.5 tablets (400 mg total) by mouth 3 (three) times daily. 06/01/20  Yes Aline August, MD  hydrALAZINE (APRESOLINE) 25 MG tablet Take 1 tablet (25 mg total) by mouth 3 (three) times daily. 05/11/19  Yes Danford, Suann Larry, MD  INGREZZA 80 MG CAPS Take 80 mg by mouth daily. 10/15/17  Yes [provider]  isosorbide mononitrate (IMDUR) 60 MG 24 hr tablet Take 1 tablet (60 mg total) by mouth daily. 06/18/20  Yes Wendie Agreste, MD  labetalol ([place])  300 MG tablet Take 300 mg by mouth 2 (two) times daily.   Yes [provider]  melatonin 5 MG TABS Take 5 mg by mouth at bedtime.   Yes [provider]  ondansetron (ZOFRAN) 4 MG tablet TAKE 1 TABLET BY MOUTH EVERY 8 HOURS AS NEEDED FOR NAUSEA OR VOMITING Patient taking differently: Take 4 mg by mouth every 8 (eight) hours as needed for nausea or vomiting. 01/12/20  Yes Wendie Agreste, MD  pantoprazole (PROTONIX) 20 MG tablet Take 1 tablet (20 mg total) by mouth 2 (two) times daily. 06/01/20  Yes Aline August, MD   Social History   Socioeconomic History  . Marital status: Single    Spouse name: Not on file  . Number of children: 0  . Years of education: 64  . Highest education level: High school graduate  Occupational History  . Occupation: Disabled  Tobacco Use  . Smoking status:  Current Every Day Smoker    Packs/day: 1.00    Types: Cigarettes  . Smokeless tobacco: Never Used  Vaping Use  . Vaping Use: Former  . Start date: 06/27/2013  . Quit date: 06/14/2017  . Devices: Apple Cinnamon-0 mg  Substance and Sexual Activity  . Alcohol use: No  . Drug use: Not Currently    Comment: h/o IVD use  . Sexual activity: Not Currently  Other Topics Concern  . Not on file  Social History Narrative   Lives at home alone.   Right-handed.   Caffeine use: 4 cups coffee/soda   Social Determinants of Health   Financial Resource Strain: Not on file  Food Insecurity: Not on file  Transportation Needs: Not on file  Physical Activity: Not on file  Stress: Not on file  Social Connections: Not on file  Intimate Partner Violence: Not on file    Review of Systems Per HPI.   Objective:   Vitals:   07/25/20 0935 07/25/20 0937  BP: (!) 215/81 (!) 202/88  Pulse: 67   Temp: (!) 97.3 F (36.3 C)   TempSrc: Temporal   SpO2: 95%   Weight: 234 lb 3.2 oz (106.2 kg)   Height: 5\' 2"  (1.575 m)      Physical Exam Vitals reviewed.  Constitutional:      Appearance: She is well-developed and well-nourished.  HENT:     Head: Normocephalic and atraumatic.  Eyes:     Extraocular Movements: EOM normal.     Conjunctiva/sclera: Conjunctivae normal.     Pupils: Pupils are equal, round, and reactive to light.  Neck:     Vascular: No carotid bruit.  Cardiovascular:     Rate and Rhythm: Normal rate and regular rhythm.     Pulses: Intact distal pulses.     Heart sounds: Normal heart sounds.  Pulmonary:     Effort: Pulmonary effort is normal.     Breath sounds: Normal breath sounds.  Abdominal:     Palpations: Abdomen is soft. There is no pulsatile mass.  Skin:    General: Skin is warm and dry.  Neurological:     Mental Status: She is alert and oriented to person, place, and time.  Psychiatric:        Mood and Affect: Mood and affect normal.        Behavior: Behavior  normal.    DG Chest 2 View  Result Date: 07/25/2020 CLINICAL DATA:  Preoperative exam.  History of CHF EXAM: CHEST - 2 VIEW COMPARISON:  05/29/2020 FINDINGS: Stable mild cardiomegaly.  No focal airspace consolidation, pleural effusion, or pneumothorax. Cervical ACDF. IMPRESSION: No active cardiopulmonary disease. Electronically Signed   By: Davina Poke D.O.   On: 07/25/2020 11:22    36 minutes spent during visit, greater than 50% counseling and assimilation of information, chart review, and discussion of plan.   Assessment & Plan:  Sherry [name] is a 65 y.o. female . Preoperative evaluation to rule out surgical contraindication - Plan: Ambulatory referral to Cardiology, Comprehensive metabolic panel, Protime-INR, DG Chest 2 View  CKD (chronic kidney disease) stage 4, GFR 15-29 ml/min (HCC) - Plan: Ambulatory referral to Nephrology  Essential hypertension - Plan: Ambulatory referral to Cardiology, Ambulatory referral to Nephrology  Chronic diastolic CHF (congestive heart failure) ([place]) - Plan: Ambulatory referral to Cardiology  Anemia due to chronic kidney disease, unspecified CKD stage - Plan: CBC  Preoperative evaluation for lumbar spine surgery.  History of CHF, uncontrolled hypertension, chronic kidney disease, prior TIA.  Will need evaluation with cardiology for cardiac clearance, will also ask nephrology to evaluate for surgical clearance given her CKD and uncontrolled hypertension as well as make any specific recommendations perioperatively.  Chest x-ray without concerning findings.  Will check PT/INR, CBC with history of anemia, and CMP.  Elevated blood pressure in office today may be in part due to not yet taken medication today.  Home monitoring with RTC precautions will contact with specialist discussed.  Gabapentin is not yet due for refill, advised to let us know closer to refill time.   No orders of the defined types were placed in this encounter.  Patient Instructions     You will need to meet with cardiology to discuss cardiac clearance for surgery. I have placed a referral.   Nephrology will also need to evaluate you for preop clearance as well with your blood pressure and chronic kidney disease. I will try to get you an appointment soon   Keep a record of your blood pressures outside of the office on your meds and if remains over 140/90 call your nephrologist to discuss changes.   Gabapentin should not be due for refill until February 8.  Let me know as you get closer to that time and we can look at refill at that time.  Return to the clinic or go to the nearest emergency room if any of your symptoms worsen or new symptoms occur.   If you have lab work done today you will be contacted with your lab results within the next 2 weeks.  If you have not heard from [place] then please contact us. The fastest way to get your results is to register for My Chart.   IF you received an x-ray today, you will receive an invoice from [facility]. Please contact [facility] at [phone] with questions or concerns regarding your invoice.   IF you received labwork today, you will receive an invoice from [facility]. Please contact [facility] at [phone] with questions or concerns regarding your invoice.   Our billing staff will not be able to assist you with questions regarding bills from these companies.  You will be contacted with the lab results as soon as they are available. The fastest way to get your results is to activate your My Chart account. Instructions are located on the last page of this paperwork. If you have not heard from [place] regarding the results in 2 weeks, please contact this office.          Signed, Merri Ray, MD [facility]

## 2020-07-26 ENCOUNTER — Ambulatory Visit (HOSPITAL_COMMUNITY): Payer: Self-pay | Admitting: Behavioral Health

## 2020-07-26 DIAGNOSIS — C50919 Malignant neoplasm of unspecified site of unspecified female breast: Secondary | ICD-10-CM | POA: Insufficient documentation

## 2020-07-26 DIAGNOSIS — I509 Heart failure, unspecified: Secondary | ICD-10-CM | POA: Insufficient documentation

## 2020-07-26 DIAGNOSIS — N189 Chronic kidney disease, unspecified: Secondary | ICD-10-CM | POA: Insufficient documentation

## 2020-07-26 DIAGNOSIS — B1920 Unspecified viral hepatitis C without hepatic coma: Secondary | ICD-10-CM | POA: Insufficient documentation

## 2020-07-26 DIAGNOSIS — R0600 Dyspnea, unspecified: Secondary | ICD-10-CM | POA: Insufficient documentation

## 2020-07-26 DIAGNOSIS — F419 Anxiety disorder, unspecified: Secondary | ICD-10-CM | POA: Insufficient documentation

## 2020-07-26 DIAGNOSIS — Z5189 Encounter for other specified aftercare: Secondary | ICD-10-CM | POA: Insufficient documentation

## 2020-07-26 DIAGNOSIS — F1120 Opioid dependence, uncomplicated: Secondary | ICD-10-CM | POA: Insufficient documentation

## 2020-07-26 DIAGNOSIS — F32A Depression, unspecified: Secondary | ICD-10-CM | POA: Insufficient documentation

## 2020-07-26 DIAGNOSIS — F319 Bipolar disorder, unspecified: Secondary | ICD-10-CM | POA: Insufficient documentation

## 2020-07-26 DIAGNOSIS — Z4901 Encounter for fitting and adjustment of extracorporeal dialysis catheter: Secondary | ICD-10-CM | POA: Insufficient documentation

## 2020-07-26 DIAGNOSIS — F1910 Other psychoactive substance abuse, uncomplicated: Secondary | ICD-10-CM | POA: Insufficient documentation

## 2020-07-26 DIAGNOSIS — G8929 Other chronic pain: Secondary | ICD-10-CM | POA: Insufficient documentation

## 2020-07-26 DIAGNOSIS — A4902 Methicillin resistant Staphylococcus aureus infection, unspecified site: Secondary | ICD-10-CM | POA: Insufficient documentation

## 2020-07-26 LAB — COMPREHENSIVE METABOLIC PANEL
ALT: 6 IU/L (ref 0–32)
AST: 13 IU/L (ref 0–40)
Albumin/Globulin Ratio: 1.6 (ref 1.2–2.2)
Albumin: 4.2 g/dL (ref 3.8–4.8)
Alkaline Phosphatase: 127 IU/L — ABNORMAL HIGH (ref 44–121)
BUN/Creatinine Ratio: 13 (ref 12–28)
BUN: 41 mg/dL — ABNORMAL HIGH (ref 8–27)
Bilirubin Total: <0.2 mg/dL (ref 0.0–1.2)
CO2: 17 mmol/L — ABNORMAL LOW (ref 20–29)
Calcium: 9.4 mg/dL (ref 8.7–10.3)
Chloride: 108 mmol/L — ABNORMAL HIGH (ref 96–106)
Creatinine, Ser: 3.04 mg/dL (ref 0.57–1.00)
GFR calc Af Amer: 18 mL/min/{1.73_m2} — ABNORMAL LOW (ref >59)
GFR calc non Af Amer: 16 mL/min/{1.73_m2} — ABNORMAL LOW (ref >59)
Globulin, Total: 2.7 g/dL (ref 1.5–4.5)
Glucose: 91 mg/dL (ref 65–99)
Potassium: 5.2 mmol/L (ref 3.5–5.2)
Sodium: 141 mmol/L (ref 134–144)
Total Protein: 6.9 g/dL (ref 6.0–8.5)

## 2020-07-26 LAB — CBC
Hematocrit: 30.8 % — ABNORMAL LOW (ref 34.0–46.6)
Hemoglobin: 10.2 g/dL — ABNORMAL LOW (ref 11.1–15.9)
MCH: 28.3 pg (ref 26.6–33.0)
MCHC: 33.1 g/dL (ref 31.5–35.7)
MCV: 85 fL (ref 79–97)
Platelets: 258 10*3/uL (ref 150–450)
RBC: 3.61 x10E6/uL — ABNORMAL LOW (ref 3.77–5.28)
RDW: 16.4 % — ABNORMAL HIGH (ref 11.7–15.4)
WBC: 5.8 10*3/uL (ref 3.4–10.8)

## 2020-07-26 LAB — PROTIME-INR
INR: 1 (ref 0.9–1.2)
Prothrombin Time: 10 s (ref 9.1–12.0)

## 2020-07-28 NOTE — Addendum Note (Signed)
Addended by: Merri Ray R on: 07/28/2020 12:47 PM   Modules accepted: Orders

## 2020-07-30 ENCOUNTER — Encounter: Payer: Self-pay | Admitting: Cardiology

## 2020-07-30 ENCOUNTER — Telehealth: Payer: Self-pay

## 2020-07-30 ENCOUNTER — Other Ambulatory Visit: Payer: Self-pay

## 2020-07-30 ENCOUNTER — Telehealth (INDEPENDENT_AMBULATORY_CARE_PROVIDER_SITE_OTHER): Payer: Medicare HMO | Admitting: Cardiology

## 2020-07-30 VITALS — Ht 62.0 in | Wt 232.0 lb

## 2020-07-30 DIAGNOSIS — N183 Chronic kidney disease, stage 3 unspecified: Secondary | ICD-10-CM | POA: Diagnosis not present

## 2020-07-30 DIAGNOSIS — Z01810 Encounter for preprocedural cardiovascular examination: Secondary | ICD-10-CM

## 2020-07-30 DIAGNOSIS — N184 Chronic kidney disease, stage 4 (severe): Secondary | ICD-10-CM

## 2020-07-30 DIAGNOSIS — I5020 Unspecified systolic (congestive) heart failure: Secondary | ICD-10-CM

## 2020-07-30 DIAGNOSIS — I10 Essential (primary) hypertension: Secondary | ICD-10-CM | POA: Diagnosis not present

## 2020-07-30 NOTE — Patient Instructions (Addendum)
Medication Instructions:  No medication changes. *If you need a refill on your cardiac medications before your next appointment, please call your pharmacy*   Lab Work: None ordered If you have labs (blood work) drawn today and your tests are completely normal, you will receive your results only by: Marland Kitchen MyChart Message (if you have MyChart) OR . A paper copy in the mail If you have any lab test that is abnormal or we need to change your treatment, we will call you to review the results.   Testing/Procedures: Your physician has requested that you have a lexiscan myoview. For further information please visit [email or web address]. Please follow instruction sheet, as given.  The test will be done over 2 days and take approximately 3 to 4 hours each day to complete; you may bring reading material.  If someone comes with you to your appointment, they will need to remain in the main lobby due to limited space in the testing area.   How to prepare for your Myocardial Perfusion Test: . Do not eat or drink 3 hours prior to your test, except you may have water. . Do not consume products containing caffeine (regular or decaffeinated) 12 hours prior to your test. (ex: coffee, chocolate, sodas, tea). . Do bring a list of your current medications with you.  If not listed below, you may take your medications as normal. . Do wear comfortable clothes (no dresses or overalls) and walking shoes, tennis shoes preferred (No heels or open toe shoes are allowed). . Do NOT wear cologne, perfume, aftershave, or lotions (deodorant is allowed). . If these instructions are not followed, your test will have to be rescheduled.    Follow-Up: At [facility], you and your health needs are our priority.  As part of our continuing mission to provide you with exceptional heart care, we have created designated Provider Care Teams.  These Care Teams include your primary Cardiologist (physician) and Advanced Practice Providers  (APPs -  Physician Assistants and Nurse Practitioners) who all work together to provide you with the care you need, when you need it.  We recommend signing up for the patient portal called "MyChart".  Sign up information is provided on this After Visit Summary.  MyChart is used to connect with patients for Virtual Visits (Telemedicine).  Patients are able to view lab/test results, encounter notes, upcoming appointments, etc.  Non-urgent messages can be sent to your provider as well.   To learn more about what you can do with MyChart, go to [email or web address].    Your next appointment:   6 month(s)  The format for your next appointment:   In Person  Provider:   Jyl Heinz, MD   Other Instructions  Cardiac Nuclear Scan A cardiac nuclear scan is a test that is done to check the flow of blood to your heart. It is done when you are resting and when you are exercising. The test looks for problems such as:  Not enough blood reaching a portion of the heart.  The heart muscle not working as it should. You may need this test if:  You have heart disease.  You have had lab results that are not normal.  You have had heart surgery or a balloon procedure to open up blocked arteries (angioplasty).  You have chest pain.  You have shortness of breath. In this test, a special dye (tracer) is put into your bloodstream. The tracer will travel to your heart. A camera will then take pictures of your  heart to see how the tracer moves through your heart. This test is usually done at a hospital and takes 2-4 hours. Tell a doctor about:  Any allergies you have.  All medicines you are taking, including vitamins, herbs, eye drops, creams, and over-the-counter medicines.  Any problems you or family members have had with anesthetic medicines.  Any blood disorders you have.  Any surgeries you have had.  Any medical conditions you have.  Whether you are pregnant or may be pregnant. What are  the risks? Generally, this is a safe test. However, problems may occur, such as:  Serious chest pain and heart attack. This is only a risk if the stress portion of the test is done.  Rapid heartbeat.  A feeling of warmth in your chest. This feeling usually does not last long.  Allergic reaction to the tracer. What happens before the test?  Ask your doctor about changing or stopping your normal medicines. This is important.  Follow instructions from your doctor about what you cannot eat or drink.  Remove your jewelry on the day of the test. What happens during the test?  An IV tube will be inserted into one of your veins.  Your doctor will give you a small amount of tracer through the IV tube.  You will wait for 20-40 minutes while the tracer moves through your bloodstream.  Your heart will be monitored with an electrocardiogram (ECG).  You will lie down on an exam table.  Pictures of your heart will be taken for about 15-20 minutes.  You may also have a stress test. For this test, one of these things may be done: ? You will be asked to exercise on a treadmill or a stationary bike. ? You will be given medicines that will make your heart work harder. This is done if you are unable to exercise.  When blood flow to your heart has peaked, a tracer will again be given through the IV tube.  After 20-40 minutes, you will get back on the exam table. More pictures will be taken of your heart.  Depending on the tracer that is used, more pictures may need to be taken 3-4 hours later.  Your IV tube will be removed when the test is over. The test may vary among doctors and hospitals. What happens after the test?  Ask your doctor: ? Whether you can return to your normal schedule, including diet, activities, and medicines. ? Whether you should drink more fluids. This will help to remove the tracer from your body. Drink enough fluid to keep your pee (urine) pale yellow.  Ask your  doctor, or the department that is doing the test: ? When will my results be ready? ? How will I get my results? Summary  A cardiac nuclear scan is a test that is done to check the flow of blood to your heart.  Tell your doctor whether you are pregnant or may be pregnant.  Before the test, ask your doctor about changing or stopping your normal medicines. This is important.  Ask your doctor whether you can return to your normal activities. You may be asked to drink more fluids. This information is not intended to replace advice given to you by your health care provider. Make sure you discuss any questions you have with your health care provider. Document Revised: 10/19/2018 Document Reviewed: 12/13/2017 Elsevier Patient Education  2021 [employer].   .

## 2020-07-30 NOTE — Progress Notes (Signed)
Virtual Visit via Telephone Note   This visit type was conducted due to national recommendations for restrictions regarding the COVID-19 Pandemic (e.g. social distancing) in an effort to limit this patient's exposure and mitigate transmission in our community.  Due to her co-morbid illnesses, this patient is at least at moderate risk for complications without adequate follow up.  This format is felt to be most appropriate for this patient at this time.  The patient did not have access to video technology/had technical difficulties with video requiring transitioning to audio format only (telephone).  All issues noted in this document were discussed and addressed.  No physical exam could be performed with this format.  Please refer to the patient's chart for her  consent to telehealth for [facility].    Date:  07/30/2020   ID:  [name], DOB May 09, 1956, MRN [id number] The patient was identified using 2 identifiers.  Patient Location: Home Provider Location: Home Office  PCP:  Wendie Agreste, MD  Cardiologist:  Jenean Lindau, MD  Electrophysiologist:  None   Evaluation Performed:  Follow-Up Visit  Chief Complaint: Preop risk stratification  History of Present Illness:    Alica Shellhammer is a 65 y.o. female with past medical history of congestive heart failure, advanced renal insufficiency, essential hypertension and morbid obesity.  She mentions to me that she is planning to undergo L4-L5 surgery for wants to be assessed.  She denies any chest pain orthopnea or PND however she leads a very sedentary lifestyle.  At the time of my evaluation, the patient is alert awake oriented and in no distress.  The patient does not have symptoms concerning for COVID-19 infection (fever, chills, cough, or new shortness of breath).    Past Medical History:  Diagnosis Date  . Alcohol abuse 05/09/2019  . Alcohol use disorder, severe, dependence ([place]) 08/27/2017  . Alcohol withdrawal ([place])  08/09/2017  . Anxiety   . ARF (acute renal failure) ([place]) 04/09/2020  . Bilateral low back pain with bilateral sciatica 01/05/2019  . Bipolar and related disorder ([place])   . Blood transfusion without reported diagnosis   . Breast cancer ([place])    right lumpectemy and lymph node removal  . Cancer ([place]) 09/06/2017   Rt breast S/P mastectomy/AND  . Chest pain 05/31/2020  . Chest pain, rule out acute myocardial infarction 05/29/2020  . CHF (congestive heart failure) ([place])   . Chronic back pain   . Chronic diastolic CHF (congestive heart failure) ([place]) 05/09/2019  . Chronic kidney disease    "Stage IV" - states due to hypertension  . Chronic kidney disease, stage III (moderate) ([place]) 11/17/2016  . Chronic low back pain 09/06/2017   Disc levels:    No abnormality at L2-3 or above.    L3-4:Mild bulging of the disc.No stenosis.    L4-5: Bilateral facet arthropathy with gaping, fluid-filled joints.  Joint edema. Anterolisthesis would likely occur at this level with  standing and flexion. Disc degeneration with mild bulging of the  disc. Mild narrowing of the lateral recesses and foramina, which  would likely worsen  . Cigarette smoker 12/31/2017  . CKD (chronic kidney disease) stage 4, GFR 15-29 ml/min (H[facility]) 04/18/2020  . Cyst of ovary   . Depression   . Depression with anxiety 08/21/2016  . Dizziness 02/24/2018  . DOE (dyspnea on exertion) 12/30/2017   12/30/2017   Walked RA x one lap @ 185 stopped due to  Sob/ lightheaded weak with sats 96% at end -  Spirometry 12/30/2017  FEV1 1.27 (68%)  Ratio 99 s curvature / abn effort dep portion only    . Dyspnea   . Dyspnea and respiratory abnormality 02/13/2013   Formatting of this note might be different from the original. STORY: PSG on 05/20/12 showed no OSA but upper airway resistance syndrome, AHI 3.9, severe snoring was noted. ESS=8. UARS. Advise positional therapy, weight loss program, regular exercise.  . Essential hypertension 07/06/2017  . GERD  (gastroesophageal reflux disease)   . Grief reaction with prolonged bereavement 09/06/2017   Partner of 18 yrs left 2018-pt admits she still loves her  . Hepatitis C   . Hepatitis C virus infection cured after antiviral drug therapy 12/17/2012   Telephone Encounter - Dub Mikes, RN - 12/28/2016 9:54 AM EDT Patient called for HCV RNA test results. EOT 08-26-16 - not detected. 12 week post treatment 12-11-16 - not detected. Informed patient that she was cured. Patient verbalized understanding.        Marland Kitchen History of breast cancer 02/13/2013  . Hx of bipolar disorder 01/31/2016  . Hypertension   . Hypertensive emergency 10/21/2017  . Left-sided weakness   . MDD (major depressive disorder), recurrent severe, without psychosis ([employer])   . Medication intolerance 09/06/2017   Remeron-appetite stimulant/Dizziness/Dysphoria  . Microcytic anemia 10/21/2017  . Morbid obesity due to excess calories ([place]) 02/13/2013   BMI 42.9  . MRSA infection    of breast incision  . Nausea 04/09/2020   Formatting of this note might be different from the original. Added automatically from request for surgery [id number]  . Near syncope 05/29/2020  . Neuroleptic-induced tardive dyskinesia 09/06/2017   Abilify  . Opioid dependence ([place])    Has been treated in [facility] in past  . Opioid use disorder, severe, dependence ([place]) 08/27/2017  . Oral aphthous ulcer 06/25/2015   Last Assessment & Plan:  Formatting of this note might be different from the original. - Appear to be resolving as a be expected in the case of aphthous stomatitis. - Continue topical therapy with viscous lidocaine.  Recommended trying high-dose anti-inflammatory such as 600 mg of ibuprofen as needed. - She'll return to ENT clinic if symptoms do not resolve.  . Phlebitis after infusion 10/28/2017   Rt antecubital fossa  . Pre-operative cardiovascular examination 06/13/2018  . Restless legs syndrome 02/13/2013   Formatting of this note might be different from the  original. IMPRESSION: Possible. Will follow.  . Subacute dyskinesia due to drug 02/13/2013   Formatting of this note might be different from the original. STORY: Tardive dyskinesia and mild limb dyskinesia, LE>UE which likely due to prolonged antipsychotic used, abilify. Couldn't tolerate artane, gabapentin, clonazepam and xenazine.`E1o3L`IMPRESSION: Well tolerated depakote 250mg  bid, mood aspect is better as well. Will increase to 500mg  bid. ADR was discussed.  RTC 4-6 weeks.  . Substance abuse ([place])   . Substance induced mood disorder ([place]) 08/13/2017  . TIA (transient ischemic attack) 2020   P/w BP >230/120 and neurologic symptoms, presumed TIA   Past Surgical History:  Procedure Laterality Date  . BACK SURGERY  1990  . BREAST SURGERY Right 2010   "breast cancer survivor" - states partial mastectomy and nodes  . COLONOSCOPY     [facility]  . ESOPHAGOGASTRODUODENOSCOPY     [facility]  . ESOPHAGOGASTRODUODENOSCOPY  04/18/2020   [facility]  . LAPAROSCOPIC CHOLECYSTECTOMY  2002  . LAPAROSCOPIC INCISIONAL / UMBILICAL / VENTRAL HERNIA REPAIR  04/13/2018   with BARD 15x 20cm  mesh (supraumbilical)  . LAPAROSCOPIC LYSIS OF ADHESIONS  07/12/2018   Procedure: LAPAROSCOPIC LYSIS OF ADHESIONS;  Surgeon: Isabel Caprice, MD;  Location: [facility];  Service: Gynecology;;  . MYOMECTOMY     x 2 prior to hysterectomy  . ROBOTIC ASSISTED BILATERAL SALPINGO OOPHERECTOMY Right 07/12/2018   Procedure: XI ROBOTIC ASSISTED RIGHT SALPINGO OOPHORECTOMY;  Surgeon: Isabel Caprice, MD;  Location: [facility];  Service: Gynecology;  Laterality: Right;  . TOTAL ABDOMINAL HYSTERECTOMY     fibroids   . UPPER GASTROINTESTINAL ENDOSCOPY       Current Meds  Medication Sig  . amLODipine (NORVASC) 10 MG tablet Take 1 tablet (10 mg total) by mouth daily.  . Cholecalciferol (VITAMIN D3) 50 MCG (2000 UT) capsule TAKE 1 CAPSULE BY MOUTH DAILY  . diphenhydramine-acetaminophen (TYLENOL PM) 25-500 MG TABS tablet  Take 1 tablet by mouth at bedtime as needed.  . ferrous sulfate (FEROSUL) 325 (65 FE) MG tablet Take 1 tablet (325 mg total) by mouth daily with breakfast.  . FLUoxetine (PROZAC) 40 MG capsule Take 40 mg by mouth daily.   Marland Kitchen gabapentin (NEURONTIN) 800 MG tablet Take 800 mg by mouth 3 (three) times daily.  . hydrALAZINE (APRESOLINE) 25 MG tablet Take 1 tablet (25 mg total) by mouth 3 (three) times daily.  . INGREZZA 80 MG CAPS Take 80 mg by mouth daily.  . isosorbide mononitrate (IMDUR) 60 MG 24 hr tablet Take 1 tablet (60 mg total) by mouth daily.  Marland Kitchen labetalol (NORMODYNE) 300 MG tablet Take 300 mg by mouth 2 (two) times daily.  . melatonin 5 MG TABS Take 5 mg by mouth at bedtime as needed.  . ondansetron (ZOFRAN) 4 MG tablet TAKE 1 TABLET BY MOUTH EVERY 8 HOURS AS NEEDED FOR NAUSEA OR VOMITING  . pantoprazole (PROTONIX) 20 MG tablet Take 1 tablet (20 mg total) by mouth 2 (two) times daily.     Allergies:   Abilify [aripiprazole], Remeron [mirtazapine], Trazodone and nefazodone, Flexeril [cyclobenzaprine], and Amoxicillin   Social History   Tobacco Use  . Smoking status: Current Every Day Smoker    Packs/day: 1.00    Types: Cigarettes  . Smokeless tobacco: Never Used  Vaping Use  . Vaping Use: Former  . Start date: 06/27/2013  . Quit date: 06/14/2017  . Devices: Apple Cinnamon-0 mg  Substance Use Topics  . Alcohol use: No  . Drug use: Not Currently    Comment: h/o IVD use     Family Hx: The patient's family history includes Breast cancer (age of onset: 15) in her mother; Cancer (age of onset: 19) in her father; Dementia in her mother; Heart attack in her mother. There is no history of Colon cancer, Esophageal cancer, Stomach cancer, or Rectal cancer.  ROS:   Please see the history of present illness.    I discussed my findings with the patient at length All other systems reviewed and are negative.   Prior CV studies:   The following studies were reviewed today:  Results of  prior evaluation including stress test were reviewed  Labs/Other Tests and Data Reviewed:    EKG:  EKG from previous evaluation was reviewed.  Recent Labs: 05/29/2020: B Natriuretic Peptide 265.7 06/01/2020: Magnesium 1.7 07/25/2020: ALT 6; BUN 41; Creatinine, Ser 3.04; Hemoglobin 10.2; Platelets 258; Potassium 5.2; Sodium 141   Recent Lipid Panel Lab Results  Component Value Date/Time   CHOL 231 (H) 04/04/2020 03:54 PM   TRIG 361 (H) 04/04/2020 03:54 PM  HDL 44 04/04/2020 03:54 PM   CHOLHDL 5.3 (H) 04/04/2020 03:54 PM   CHOLHDL 4.8 05/09/2019 05:47 AM   LDLCALC 123 (H) 04/04/2020 03:54 PM    Wt Readings from Last 3 Encounters:  07/30/20 232 lb (105.2 kg)  07/25/20 234 lb 3.2 oz (106.2 kg)  07/09/20 229 lb 15 oz (104.3 kg)     Risk Assessment/Calculations:      Objective:    Vital Signs:  Ht 5\' 2"  (1.575 m)   Wt 232 lb (105.2 kg)   BMI 42.43 kg/m    VITAL SIGNS:  reviewed  ASSESSMENT & PLAN:    1. Preoperative recertification: I discussed my findings with the patient in extensive length.  She has multiple risk factors for coronary artery disease.  She has history of essential hypertension, she is morbidly obese and leads a sedentary lifestyle because of orthopedic issues.  Because of this I would suggest Lexiscan sestamibi as a preop risk assessment tool and she is agreeable.  She will need a daily procedure.  If this test is negative she is not at high risk for coronary events during the aforementioned surgery.  Meticulous hemodynamic monitoring will further reduce the risk of coronary events.  We will get an idea of her blood pressure when she comes for stress testing. 2. Essential hypertension: Blood pressure stable according to the patient.  Again when she comes for stress testing we will understand her blood pressure better.  This was a virtual visit and had to be done quickly because of the patient's emergent need for preop assessment. 3. Advanced renal  insufficiency: This also increases her risk for preoperative events.  She had a an idea of this.   4. Morbid obesity: Risks explained to the patient.  Diet was emphasized and she promises to do better.        COVID-19 Education: The signs and symptoms of COVID-19 were discussed with the patient and how to seek care for testing (follow up with PCP or arrange E-visit).  The importance of social distancing was discussed today.  Time:   Today, I have spent 15 minutes with the patient with telehealth technology discussing the above problems.     Medication Adjustments/Labs and Tests Ordered: Current medicines are reviewed at length with the patient today.  Concerns regarding medicines are outlined above.   Tests Ordered: No orders of the defined types were placed in this encounter.   Medication Changes: No orders of the defined types were placed in this encounter.   Follow Up:  In Person prn  Signed, Jenean Lindau, MD  07/30/2020 10:29 AM    [place]

## 2020-07-30 NOTE — Telephone Encounter (Signed)
  Patient Consent for Virtual Visit         Sherry [name] has provided verbal consent on 07/30/2020 for a virtual visit (video or telephone).   CONSENT FOR VIRTUAL VISIT FOR:  Sherry [name]  By participating in this virtual visit I agree to the following:  I hereby voluntarily request, consent and authorize [facility] and its employed or contracted physicians, physician assistants, nurse practitioners or other licensed health care professionals (the Practitioner), to provide me with telemedicine health care services (the "Services") as deemed necessary by the treating Practitioner. I acknowledge and consent to receive the Services by the Practitioner via telemedicine. I understand that the telemedicine visit will involve communicating with the Practitioner through live audiovisual communication technology and the disclosure of certain medical information by electronic transmission. I acknowledge that I have been given the opportunity to request an in-person assessment or other available alternative prior to the telemedicine visit and am voluntarily participating in the telemedicine visit.  I understand that I have the right to withhold or withdraw my consent to the use of telemedicine in the course of my care at any time, without affecting my right to future care or treatment, and that the Practitioner or I may terminate the telemedicine visit at any time. I understand that I have the right to inspect all information obtained and/or recorded in the course of the telemedicine visit and may receive copies of available information for a reasonable fee.  I understand that some of the potential risks of receiving the Services via telemedicine include:  Marland Kitchen Delay or interruption in medical evaluation due to technological equipment failure or disruption; . Information transmitted may not be sufficient (e.g. poor resolution of images) to allow for appropriate medical decision making by the Practitioner;  and/or  . In rare instances, security protocols could fail, causing a breach of personal health information.  Furthermore, I acknowledge that it is my responsibility to provide information about my medical history, conditions and care that is complete and accurate to the best of my ability. I acknowledge that Practitioner's advice, recommendations, and/or decision may be based on factors not within their control, such as incomplete or inaccurate data provided by me or distortions of diagnostic images or specimens that may result from electronic transmissions. I understand that the practice of medicine is not an exact science and that Practitioner makes no warranties or guarantees regarding treatment outcomes. I acknowledge that a copy of this consent can be made available to me via my patient portal ([facility]), or I can request a printed copy by calling the office of [facility].    I understand that my insurance will be billed for this visit.   I have read or had this consent read to me. . I understand the contents of this consent, which adequately explains the benefits and risks of the Services being provided via telemedicine.  . I have been provided ample opportunity to ask questions regarding this consent and the Services and have had my questions answered to my satisfaction. . I give my informed consent for the services to be provided through the use of telemedicine in my medical care

## 2020-07-31 ENCOUNTER — Telehealth (HOSPITAL_COMMUNITY): Payer: Self-pay | Admitting: *Deleted

## 2020-07-31 ENCOUNTER — Other Ambulatory Visit: Payer: Self-pay | Admitting: Family Medicine

## 2020-07-31 NOTE — Telephone Encounter (Signed)
Requested medication (s) are due for refill today -yes  Requested medication (s) are on the active medication list - yes  Future visit scheduled -no  Last refill: 06/18/20  Notes to clinic: Request RF- non delegated Rx  Requested Prescriptions  Pending Prescriptions Disp Refills   Cholecalciferol (VITAMIN D3) 50 MCG (2000 UT) capsule [Pharmacy Med Name: Vitamin D3 50 MCG(2000 UT) CAPS] 30 capsule 0    Sig: TAKE 1 CAPSULE BY MOUTH DAILY      Endocrinology:  Vitamins - Vitamin D Supplementation Failed - 07/31/2020  2:57 PM      Failed - 50,000 IU strengths are not delegated      Failed - Phosphate in normal range and within 360 days    Phosphorus  Date Value Ref Range Status  05/11/2019 4.8 (H) 2.5 - 4.6 mg/dL Final          Failed - Vitamin D in normal range and within 360 days    Vit D, 25-Hydroxy  Date Value Ref Range Status  05/16/2019 14.0  Final          Passed - Ca in normal range and within 360 days    Calcium  Date Value Ref Range Status  07/25/2020 9.4 8.7 - 10.3 mg/dL Final          Passed - Valid encounter within last 12 months    Recent Outpatient Visits           6 days ago Preoperative evaluation to rule out surgical contraindication   [facility], Ranell Patrick, MD   1 month ago CKD (chronic kidney disease) stage 4, GFR 15-29 ml/min ([place])   [facility], Ranell Patrick, MD   2 months ago Chest pain, unspecified type   [facility], Ranell Patrick, MD   2 months ago Abdominal pain, chronic, epigastric   [facility], Ranell Patrick, MD   3 months ago Anemia in stage 3 chronic kidney disease, unspecified whether stage 3a or 3b CKD   [facility], Fenton Malling, MD                    Requested Prescriptions  Pending Prescriptions Disp Refills   Cholecalciferol (VITAMIN D3) 50 MCG (2000 UT) capsule [Pharmacy Med Name: Vitamin D3 50 MCG(2000 UT) CAPS] 30 capsule 0    Sig: TAKE 1  CAPSULE BY MOUTH DAILY      Endocrinology:  Vitamins - Vitamin D Supplementation Failed - 07/31/2020  2:57 PM      Failed - 50,000 IU strengths are not delegated      Failed - Phosphate in normal range and within 360 days    Phosphorus  Date Value Ref Range Status  05/11/2019 4.8 (H) 2.5 - 4.6 mg/dL Final          Failed - Vitamin D in normal range and within 360 days    Vit D, 25-Hydroxy  Date Value Ref Range Status  05/16/2019 14.0  Final          Passed - Ca in normal range and within 360 days    Calcium  Date Value Ref Range Status  07/25/2020 9.4 8.7 - 10.3 mg/dL Final          Passed - Valid encounter within last 12 months    Recent Outpatient Visits           6 days ago Preoperative evaluation to  rule out surgical contraindication   [facility], Ranell Patrick, MD   1 month ago CKD (chronic kidney disease) stage 4, GFR 15-29 ml/min [facility])   [facility], Ranell Patrick, MD   2 months ago Chest pain, unspecified type   [facility], Ranell Patrick, MD   2 months ago Abdominal pain, chronic, epigastric   [facility], Ranell Patrick, MD   3 months ago Anemia in stage 3 chronic kidney disease, unspecified whether stage 3a or 3b CKD   [facility] at [facility], Fenton Malling, MD

## 2020-07-31 NOTE — Telephone Encounter (Signed)
Patient given detailed instructions per Myocardial Perfusion Study Information Sheet for the test on 08/05/20. Patient notified to arrive 15 minutes early and that it is imperative to arrive on time for appointment to keep from having the test rescheduled.  If you need to cancel or reschedule your appointment, please call the office within 24 hours of your appointment. . Patient verbalized understanding. Sherry [name]   

## 2020-08-01 ENCOUNTER — Other Ambulatory Visit: Payer: Self-pay | Admitting: Neurosurgery

## 2020-08-05 ENCOUNTER — Other Ambulatory Visit: Payer: Self-pay

## 2020-08-05 ENCOUNTER — Ambulatory Visit (HOSPITAL_COMMUNITY): Payer: Medicare HMO | Attending: Cardiovascular Disease

## 2020-08-05 DIAGNOSIS — N184 Chronic kidney disease, stage 4 (severe): Secondary | ICD-10-CM | POA: Insufficient documentation

## 2020-08-05 DIAGNOSIS — I10 Essential (primary) hypertension: Secondary | ICD-10-CM | POA: Diagnosis not present

## 2020-08-05 DIAGNOSIS — Z01810 Encounter for preprocedural cardiovascular examination: Secondary | ICD-10-CM | POA: Diagnosis not present

## 2020-08-05 DIAGNOSIS — R6889 Other general symptoms and signs: Secondary | ICD-10-CM | POA: Diagnosis not present

## 2020-08-05 MED ORDER — TECHNETIUM TC 99M TETROFOSMIN IV KIT
27.5000 | PACK | Freq: Once | INTRAVENOUS | Status: AC | PRN
  Administered 2020-08-05: 27.5 via INTRAVENOUS
  Filled 2020-08-05: qty 28

## 2020-08-05 MED ORDER — REGADENOSON 0.4 MG/5ML IV SOLN
0.4000 mg | Freq: Once | INTRAVENOUS | Status: AC
  Administered 2020-08-05: 0.4 mg via INTRAVENOUS

## 2020-08-06 ENCOUNTER — Ambulatory Visit (HOSPITAL_COMMUNITY): Payer: Medicare HMO | Attending: Cardiology

## 2020-08-06 DIAGNOSIS — R6889 Other general symptoms and signs: Secondary | ICD-10-CM | POA: Diagnosis not present

## 2020-08-06 LAB — MYOCARDIAL PERFUSION IMAGING
LV dias vol: 111 mL (ref 46–106)
LV sys vol: 45 mL
Peak HR: 75 {beats}/min
Rest HR: 60 {beats}/min
SDS: 2
SRS: 0
SSS: 2
TID: 1.03

## 2020-08-06 MED ORDER — TECHNETIUM TC 99M TETROFOSMIN IV KIT
31.4000 | PACK | Freq: Once | INTRAVENOUS | Status: AC | PRN
  Administered 2020-08-06: 31.4 via INTRAVENOUS
  Filled 2020-08-06: qty 32

## 2020-08-08 ENCOUNTER — Ambulatory Visit: Payer: Medicare HMO | Admitting: Cardiology

## 2020-08-08 DIAGNOSIS — I129 Hypertensive chronic kidney disease with stage 1 through stage 4 chronic kidney disease, or unspecified chronic kidney disease: Secondary | ICD-10-CM | POA: Diagnosis not present

## 2020-08-08 DIAGNOSIS — N184 Chronic kidney disease, stage 4 (severe): Secondary | ICD-10-CM | POA: Diagnosis not present

## 2020-08-08 DIAGNOSIS — M549 Dorsalgia, unspecified: Secondary | ICD-10-CM | POA: Diagnosis not present

## 2020-08-08 DIAGNOSIS — G8929 Other chronic pain: Secondary | ICD-10-CM | POA: Diagnosis not present

## 2020-08-08 DIAGNOSIS — N189 Chronic kidney disease, unspecified: Secondary | ICD-10-CM | POA: Diagnosis not present

## 2020-08-08 DIAGNOSIS — N2581 Secondary hyperparathyroidism of renal origin: Secondary | ICD-10-CM | POA: Diagnosis not present

## 2020-08-08 DIAGNOSIS — R6889 Other general symptoms and signs: Secondary | ICD-10-CM | POA: Diagnosis not present

## 2020-08-08 DIAGNOSIS — D631 Anemia in chronic kidney disease: Secondary | ICD-10-CM | POA: Diagnosis not present

## 2020-08-12 ENCOUNTER — Telehealth: Payer: Self-pay | Admitting: Cardiology

## 2020-08-12 NOTE — Telephone Encounter (Signed)
Note and stress test sent via Epic.

## 2020-08-12 NOTE — Telephone Encounter (Signed)
    Pt said Dr. Geraldo Pitter needs to send her clearance to Dr. Arnoldo Morale including her stress test result. She gave fax# [phone]

## 2020-08-13 NOTE — Telephone Encounter (Signed)
Message left for pt to callback. Fax continues to say communication error.

## 2020-08-13 NOTE — Telephone Encounter (Signed)
Patient is calling to follow up. She states she just got off the phone with Dr. Arnoldo Morale' office and she was informed that they have not received her results. She would like to know if we can resend the fax?  Fax #: [phone]

## 2020-08-14 NOTE — Progress Notes (Signed)
[id number] - [place], [place] - [address] [place]  [address] Phone: [phone] Fax: [phone]  [place], [place] [address] Phone: [phone] Fax: [phone]      Your procedure is scheduled on February 9  Report to [facility] Main Entrance "A" at 0630 A.M., and check in at the Admitting office.  Call this number if you have problems the morning of surgery:  [phone]  Call [phone] if you have any questions prior to your surgery date Monday-Friday 8am-4pm    Remember:  Do not eat or drink after midnight the night before your surgery    Take these medicines the morning of surgery with A SIP OF WATER  amLODipine (NORVASC)  FLUoxetine (PROZAC) gabapentin (NEURONTIN) hydrALAZINE (APRESOLINE HYDROcodone-acetaminophen (NORCO)  If needed for pain INGREZZA  isosorbide mononitrate (IMDUR)  labetalol (NORMODYNE)  methocarbamol (ROBAXIN) if needed for muscle spasams pantoprazole (PROTONIX)  As of today, STOP taking any Aspirin (unless otherwise instructed by your surgeon) Aleve, Naproxen, Ibuprofen, Motrin, Advil, Goody's, BC's, all herbal medications, fish oil, and all vitamins.                      Do not wear jewelry, make up, or nail polish            Do not wear lotions, powders, perfumes/colognes, or deodorant.            Do not shave 48 hours prior to surgery.              Do not bring valuables to the hospital.            [facility] is not responsible for any belongings or valuables.  Do NOT Smoke (Tobacco/Vaping) or drink Alcohol 24 hours prior to your procedure If you use a CPAP at night, you may bring all equipment for your overnight stay.   Contacts, glasses, dentures or bridgework may not be worn into surgery.      For patients admitted to the hospital, discharge time will be  determined by your treatment team.   Patients discharged the day of surgery will not be allowed to drive home, and someone needs to stay with them for 24 hours.    Special instructions:   [place]- Preparing For Surgery  Before surgery, you can play an important role. Because skin is not sterile, your skin needs to be as free of germs as possible. You can reduce the number of germs on your skin by washing with CHG (chlorahexidine gluconate) Soap before surgery.  CHG is an antiseptic cleaner which kills germs and bonds with the skin to continue killing germs even after washing.    Oral Hygiene is also important to reduce your risk of infection.  Remember - BRUSH YOUR TEETH THE MORNING OF SURGERY WITH YOUR REGULAR TOOTHPASTE  Please do not use if you have an allergy to CHG or antibacterial soaps. If your skin becomes reddened/irritated stop using the CHG.  Do not shave (including legs and underarms) for at least 48 hours prior to first CHG shower. It is OK to shave your face.  Please follow these instructions carefully.   1. Shower the NIGHT BEFORE SURGERY and the MORNING OF SURGERY with CHG Soap.   2. If you chose to wash your hair, wash your hair first as usual with  your normal shampoo.  3. After you shampoo, rinse your hair and body thoroughly to remove the shampoo.  4. Use CHG as you would any other liquid soap. You can apply CHG directly to the skin and wash gently with a scrungie or a clean washcloth.   5. Apply the CHG Soap to your body ONLY FROM THE NECK DOWN.  Do not use on open wounds or open sores. Avoid contact with your eyes, ears, mouth and genitals (private parts). Wash Face and genitals (private parts)  with your normal soap.   6. Wash thoroughly, paying special attention to the area where your surgery will be performed.  7. Thoroughly rinse your body with warm water from the neck down.  8. DO NOT shower/wash with your normal soap after using and rinsing off the CHG  Soap.  9. Pat yourself dry with a CLEAN TOWEL.  10. Wear CLEAN PAJAMAS to bed the night before surgery  11. Place CLEAN SHEETS on your bed the night of your first shower and DO NOT SLEEP WITH PETS.   Day of Surgery: Wear Clean/Comfortable clothing the morning of surgery Do not apply any deodorants/lotions.   Remember to brush your teeth WITH YOUR REGULAR TOOTHPASTE.   Please read over the following fact sheets that you were given.

## 2020-08-15 ENCOUNTER — Other Ambulatory Visit: Payer: Self-pay

## 2020-08-15 ENCOUNTER — Encounter (HOSPITAL_COMMUNITY): Payer: Self-pay

## 2020-08-15 ENCOUNTER — Encounter (HOSPITAL_COMMUNITY)
Admission: RE | Admit: 2020-08-15 | Discharge: 2020-08-15 | Disposition: A | Discharge status: Home / Self Care | Payer: Medicare HMO | Source: Ambulatory Visit | Attending: Neurosurgery | Admitting: Neurosurgery

## 2020-08-15 DIAGNOSIS — Z01812 Encounter for preprocedural laboratory examination: Secondary | ICD-10-CM | POA: Diagnosis not present

## 2020-08-15 DIAGNOSIS — R6889 Other general symptoms and signs: Secondary | ICD-10-CM | POA: Diagnosis not present

## 2020-08-15 LAB — SURGICAL PCR SCREEN
MRSA, PCR: NEGATIVE
Staphylococcus aureus: NEGATIVE

## 2020-08-15 LAB — COMPREHENSIVE METABOLIC PANEL
ALT: 9 U/L (ref 0–44)
AST: 12 U/L — ABNORMAL LOW (ref 15–41)
Albumin: 3.5 g/dL (ref 3.5–5.0)
Alkaline Phosphatase: 97 U/L (ref 38–126)
Anion gap: 10 (ref 5–15)
BUN: 35 mg/dL — ABNORMAL HIGH (ref 8–23)
CO2: 22 mmol/L (ref 22–32)
Calcium: 9.3 mg/dL (ref 8.9–10.3)
Chloride: 106 mmol/L (ref 98–111)
Creatinine, Ser: 2.74 mg/dL — ABNORMAL HIGH (ref 0.44–1.00)
GFR, Estimated: 19 mL/min — ABNORMAL LOW (ref >60)
Glucose, Bld: 96 mg/dL (ref 70–99)
Potassium: 5.6 mmol/L — ABNORMAL HIGH (ref 3.5–5.1)
Sodium: 138 mmol/L (ref 135–145)
Total Bilirubin: 0.5 mg/dL (ref 0.3–1.2)
Total Protein: 7.2 g/dL (ref 6.5–8.1)

## 2020-08-15 LAB — CBC
HCT: 32.7 % — ABNORMAL LOW (ref 36.0–46.0)
Hemoglobin: 10.2 g/dL — ABNORMAL LOW (ref 12.0–15.0)
MCH: 29.1 pg (ref 26.0–34.0)
MCHC: 31.2 g/dL (ref 30.0–36.0)
MCV: 93.2 fL (ref 80.0–100.0)
Platelets: 247 10*3/uL (ref 150–400)
RBC: 3.51 MIL/uL — ABNORMAL LOW (ref 3.87–5.11)
RDW: 15.8 % — ABNORMAL HIGH (ref 11.5–15.5)
WBC: 5 10*3/uL (ref 4.0–10.5)
nRBC: 0 % (ref 0.0–0.2)

## 2020-08-15 LAB — TYPE AND SCREEN
ABO/RH(D): O POS
Antibody Screen: NEGATIVE

## 2020-08-15 NOTE — Progress Notes (Addendum)
PCP - [facility] Cardiologist - [facility] - [facility]  Chest x-ray - 07/25/20 EKG - 05/29/20 Stress Test - 08/06/20 ECHO - 05/30/20 Cardiac Cath - denies  COVID TEST- Test DOS due to transportation issues   Patient is requesting her pastor to pray with her before the surgery with her and the surgeon before the surgery starts.  Instructed the patient that her pastor must adhere to PPE requirements and patient would need to arrive at 0730 am.  Doristine Bosworth has a clergy badge for [facility], instructed Doristine Bosworth to bring his badge from [place].  The pastor is planning to arrive at 0730 to make sure he is here when the surgeon.  Patient stated that she has spoken with Dr Arnoldo Morale office to discuss this with them about praying with the [facility].    Anesthesia review: yes, elevated CR,  Patient denies shortness of breath, fever, cough and chest pain at PAT appointment   All instructions explained to the patient, with a verbal understanding of the material. Patient agrees to go over the instructions while at home for a better understanding. Patient also instructed to self quarantine after being tested for COVID-19. The opportunity to ask questions was provided.

## 2020-08-16 ENCOUNTER — Encounter (HOSPITAL_COMMUNITY): Payer: Self-pay | Admitting: Certified Registered"

## 2020-08-16 ENCOUNTER — Encounter (HOSPITAL_COMMUNITY): Payer: Self-pay | Admitting: Physician Assistant

## 2020-08-16 NOTE — Progress Notes (Signed)
Anesthesia Chart Review:  History of HFpEF, uncontrolled hypertension, morbid obesity.  Seen by cardiologist Dr. Geraldo Pitter for preoperative stratification.  He recommended preop Lexiscan as stated that if the test was negative she would not be a high risk for coronary events.  Nuclear stress done 08/06/2020 was nonischemic, low risk, EF 59%.  Patient is a history of markedly uncontrolled hypertension with CKD 4 as result.  She is followed by nephrology for this.  Nephrology clearance from Dr. Carolin Sicks dated 08/12/2020 states "BP elevated, creatinine level 2.76.  She needs to take her BP medications.  Not on ACEI, ARB or diuretics.  I recommend to check lab and BP before procedure.  Avoid hypotension, NSAIDs, or IV contrast.  Thanks"  History of TIA in 2020 with hypertensive urgency.  Review of PCP notes shows the patient's blood pressure has been difficult control for some time; concern for medication noncompliance.  Dr. Arnoldo Morale is aware of patient's history of uncontrolled hypertension and understands that markedly uncontrolled blood pressure on day of surgery could be cause for cancellation.  BP Readings from Last 3 Encounters:  08/15/20 (!) 181/99  07/25/20 (!) 202/88  07/09/20 (!) 192/95   History of hepatitis C status post treatment with antiviral medications.  History of alcohol abuse and polysubstance abuse, denies currently.  Preop labs showed creatinine 2.74, consistent with her history of CKD, anemia with hemoglobin 10.2 which is near baseline.  Remainder of labs unremarkable.  EKG 05/29/2020: Sinus rhythm.  Rate 67. Consider left ventricular hypertrophy No significant change since last tracing  CHEST - 2 VIEW 07/25/2020:  COMPARISON:  05/29/2020  FINDINGS: Stable mild cardiomegaly. No focal airspace consolidation, pleural effusion, or pneumothorax. Cervical ACDF.  IMPRESSION: No active cardiopulmonary disease.  Nuclear stress 08/06/2020:  There was no ST segment deviation  noted during stress.  Nuclear stress EF: 59%.  The left ventricular ejection fraction is normal (55-65%).  The study is normal.  This is a low risk study.  TTE 05/30/2020: 1. Left ventricular ejection fraction, by estimation, is 60 to 65%. The  left ventricle has normal function. The left ventricle has no regional  wall motion abnormalities. There is mild left ventricular hypertrophy.  Left ventricular diastolic parameters  are consistent with Grade II diastolic dysfunction (pseudonormalization).  Elevated left atrial pressure.  2. Right ventricular systolic function is normal. The right ventricular  size is normal. Tricuspid regurgitation signal is inadequate for assessing  PA pressure.  3. Left atrial size was mildly dilated.  4. The mitral valve is abnormal. No evidence of mitral valve  regurgitation. Moderate mitral annular calcification.  5. The aortic valve is tricuspid. Aortic valve regurgitation is not  visualized. No aortic stenosis is present.  6. The inferior vena cava is normal in size with greater than 50%  respiratory variability, suggesting right atrial pressure of 3 mmHg.   Carotid ultrasound 05/09/2019: Summary:  Right Carotid: Velocities in the right ICA are consistent with a 1-39%  stenosis.   Left Carotid: Velocities in the left ICA are consistent with a 1-39% stenosis.   Vertebrals: Bilateral vertebral arteries demonstrate antegrade flow.  Subclavians: Normal flow hemodynamics were seen in bilateral subclavian arteries.    Wynonia Musty [facility]/Anesthesiology Phone [phone] 08/16/2020 3:03 PM

## 2020-08-16 NOTE — Anesthesia Preprocedure Evaluation (Deleted)
Anesthesia Evaluation  Patient identified by MRN, date of birth, ID band Patient awake    Reviewed: Allergy & Precautions, H&P , NPO status , Patient's Chart, lab work & pertinent test results  Airway Mallampati: II   Neck ROM: full    Dental   Pulmonary shortness of breath, Current Smoker,    breath sounds clear to auscultation       Cardiovascular hypertension, + DOE   Rhythm:regular Rate:Normal  TTE (05/2020): EF 65%   Neuro/Psych PSYCHIATRIC DISORDERS Anxiety Depression TIA Neuromuscular disease    GI/Hepatic GERD  ,(+) Hepatitis -, C  Endo/Other    Renal/GU Renal InsufficiencyRenal disease     Musculoskeletal   Abdominal   Peds  Hematology   Anesthesia Other Findings   Reproductive/Obstetrics                            Anesthesia Physical Anesthesia Plan  ASA: III  Anesthesia Plan: General   Post-op Pain Management:    Induction: Intravenous  PONV Risk Score and Plan: 2 and Ondansetron, Dexamethasone, Midazolam and Treatment may vary due to age or medical condition  Airway Management Planned: Oral ETT  Additional Equipment:   Intra-op Plan:   Post-operative Plan: Extubation in OR  Informed Consent: I have reviewed the patients History and Physical, chart, labs and discussed the procedure including the risks, benefits and alternatives for the proposed anesthesia with the patient or authorized representative who has indicated his/her understanding and acceptance.       Plan Discussed with: CRNA, Anesthesiologist and Surgeon  Anesthesia Plan Comments: (PAT note by Karoline Caldwell, PA-C: History of HFpEF, uncontrolled hypertension, morbid obesity.  Seen by cardiologist Dr. Geraldo Pitter for preoperative stratification.  He recommended preop Lexiscan as stated that if the test was negative she would not be a high risk for coronary events.  Nuclear stress done 08/06/2020 was nonischemic,  low risk, EF 59%.  Patient is a history of markedly uncontrolled hypertension with CKD 4 as result.  She is followed by nephrology for this.  Nephrology clearance from Dr. Carolin Sicks dated 08/12/2020 states "BP elevated, creatinine level 2.76.  She needs to take her BP medications.  Not on ACEI, ARB or diuretics.  I recommend to check lab and BP before procedure.  Avoid hypotension, NSAIDs, or IV contrast.  Thanks"  History of TIA in 2020 with hypertensive urgency.  Review of PCP notes shows the patient's blood pressure has been difficult control for some time; concern for medication noncompliance.  Dr. Arnoldo Morale is aware of patient's history of uncontrolled hypertension and understands that markedly uncontrolled blood pressure on day of surgery could be cause for cancellation.  BP Readings from Last 3 Encounters: 08/15/20 (!) 181/99 07/25/20 (!) 202/88 07/09/20 (!) 192/95  History of hepatitis C status post treatment with antiviral medications.  History of alcohol abuse and polysubstance abuse, denies currently.  Preop labs showed creatinine 2.74, consistent with her history of CKD, anemia with hemoglobin 10.2 which is near baseline.  Remainder of labs unremarkable.  EKG 05/29/2020: Sinus rhythm.  Rate 67. Consider left ventricular hypertrophy No significant change since last tracing  CHEST - 2 VIEW 07/25/2020:  COMPARISON:  05/29/2020  FINDINGS: Stable mild cardiomegaly. No focal airspace consolidation, pleural effusion, or pneumothorax. Cervical ACDF.  IMPRESSION: No active cardiopulmonary disease.  Nuclear stress 08/06/2020: There was no ST segment deviation noted during stress. Nuclear stress EF: 59%. The left ventricular ejection fraction is normal (55-65%). The study  is normal. This is a low risk study.  TTE 05/30/2020: 1. Left ventricular ejection fraction, by estimation, is 60 to 65%. The  left ventricle has normal function. The left ventricle has no regional  wall  motion abnormalities. There is mild left ventricular hypertrophy.  Left ventricular diastolic parameters  are consistent with Grade II diastolic dysfunction (pseudonormalization).  Elevated left atrial pressure.  2. Right ventricular systolic function is normal. The right ventricular  size is normal. Tricuspid regurgitation signal is inadequate for assessing  PA pressure.  3. Left atrial size was mildly dilated.  4. The mitral valve is abnormal. No evidence of mitral valve  regurgitation. Moderate mitral annular calcification.  5. The aortic valve is tricuspid. Aortic valve regurgitation is not  visualized. No aortic stenosis is present.  6. The inferior vena cava is normal in size with greater than 50%  respiratory variability, suggesting right atrial pressure of 3 mmHg.   Carotid ultrasound 05/09/2019: Summary:  Right Carotid: Velocities in the right ICA are consistent with a 1-39%  stenosis.   Left Carotid: Velocities in the left ICA are consistent with a 1-39% stenosis.   Vertebrals: Bilateral vertebral arteries demonstrate antegrade flow.  Subclavians: Normal flow hemodynamics were seen in bilateral subclavian arteries.   )       Anesthesia Quick Evaluation

## 2020-08-20 MED ORDER — VANCOMYCIN HCL 1500 MG/300ML IV SOLN
1500.0000 mg | INTRAVENOUS | Status: DC
  Filled 2020-08-20: qty 300

## 2020-08-21 ENCOUNTER — Encounter (HOSPITAL_COMMUNITY)
Admission: RE | Disposition: A | Discharge status: Home / Self Care | Payer: Self-pay | Source: Home / Self Care | Attending: Neurosurgery

## 2020-08-21 ENCOUNTER — Other Ambulatory Visit: Payer: Self-pay

## 2020-08-21 ENCOUNTER — Ambulatory Visit (HOSPITAL_COMMUNITY)
Admission: RE | Admit: 2020-08-21 | Discharge: 2020-08-21 | Disposition: A | Discharge status: Home / Self Care | Payer: Medicare HMO | Attending: Neurosurgery | Admitting: Neurosurgery

## 2020-08-21 ENCOUNTER — Encounter (HOSPITAL_COMMUNITY): Payer: Self-pay | Admitting: Neurosurgery

## 2020-08-21 DIAGNOSIS — Z5309 Procedure and treatment not carried out because of other contraindication: Secondary | ICD-10-CM | POA: Insufficient documentation

## 2020-08-21 DIAGNOSIS — Z8249 Family history of ischemic heart disease and other diseases of the circulatory system: Secondary | ICD-10-CM | POA: Insufficient documentation

## 2020-08-21 DIAGNOSIS — M4316 Spondylolisthesis, lumbar region: Secondary | ICD-10-CM | POA: Insufficient documentation

## 2020-08-21 DIAGNOSIS — Z880 Allergy status to penicillin: Secondary | ICD-10-CM | POA: Diagnosis not present

## 2020-08-21 DIAGNOSIS — F319 Bipolar disorder, unspecified: Secondary | ICD-10-CM | POA: Insufficient documentation

## 2020-08-21 DIAGNOSIS — E875 Hyperkalemia: Secondary | ICD-10-CM | POA: Insufficient documentation

## 2020-08-21 DIAGNOSIS — M48062 Spinal stenosis, lumbar region with neurogenic claudication: Secondary | ICD-10-CM | POA: Diagnosis not present

## 2020-08-21 DIAGNOSIS — M5416 Radiculopathy, lumbar region: Secondary | ICD-10-CM | POA: Diagnosis not present

## 2020-08-21 DIAGNOSIS — N184 Chronic kidney disease, stage 4 (severe): Secondary | ICD-10-CM | POA: Insufficient documentation

## 2020-08-21 DIAGNOSIS — Z803 Family history of malignant neoplasm of breast: Secondary | ICD-10-CM | POA: Diagnosis not present

## 2020-08-21 DIAGNOSIS — F1120 Opioid dependence, uncomplicated: Secondary | ICD-10-CM | POA: Insufficient documentation

## 2020-08-21 DIAGNOSIS — F17210 Nicotine dependence, cigarettes, uncomplicated: Secondary | ICD-10-CM | POA: Diagnosis not present

## 2020-08-21 DIAGNOSIS — Z8051 Family history of malignant neoplasm of kidney: Secondary | ICD-10-CM | POA: Insufficient documentation

## 2020-08-21 DIAGNOSIS — R6889 Other general symptoms and signs: Secondary | ICD-10-CM | POA: Diagnosis not present

## 2020-08-21 DIAGNOSIS — Z20822 Contact with and (suspected) exposure to covid-19: Secondary | ICD-10-CM | POA: Insufficient documentation

## 2020-08-21 DIAGNOSIS — I5032 Chronic diastolic (congestive) heart failure: Secondary | ICD-10-CM | POA: Diagnosis not present

## 2020-08-21 DIAGNOSIS — I130 Hypertensive heart and chronic kidney disease with heart failure and stage 1 through stage 4 chronic kidney disease, or unspecified chronic kidney disease: Secondary | ICD-10-CM | POA: Diagnosis not present

## 2020-08-21 DIAGNOSIS — Z888 Allergy status to other drugs, medicaments and biological substances status: Secondary | ICD-10-CM | POA: Insufficient documentation

## 2020-08-21 DIAGNOSIS — Z79899 Other long term (current) drug therapy: Secondary | ICD-10-CM | POA: Diagnosis not present

## 2020-08-21 DIAGNOSIS — Z8673 Personal history of transient ischemic attack (TIA), and cerebral infarction without residual deficits: Secondary | ICD-10-CM | POA: Diagnosis not present

## 2020-08-21 LAB — BASIC METABOLIC PANEL
Anion gap: 10 (ref 5–15)
BUN: 38 mg/dL — ABNORMAL HIGH (ref 8–23)
CO2: 19 mmol/L — ABNORMAL LOW (ref 22–32)
Calcium: 8.9 mg/dL (ref 8.9–10.3)
Chloride: 107 mmol/L (ref 98–111)
Creatinine, Ser: 3.53 mg/dL — ABNORMAL HIGH (ref 0.44–1.00)
GFR, Estimated: 14 mL/min — ABNORMAL LOW (ref >60)
Glucose, Bld: 103 mg/dL — ABNORMAL HIGH (ref 70–99)
Potassium: 6 mmol/L — ABNORMAL HIGH (ref 3.5–5.1)
Sodium: 136 mmol/L (ref 135–145)

## 2020-08-21 LAB — POCT I-STAT, CHEM 8
BUN: 46 mg/dL — ABNORMAL HIGH (ref 8–23)
Calcium, Ion: 1.12 mmol/L — ABNORMAL LOW (ref 1.15–1.40)
Chloride: 110 mmol/L (ref 98–111)
Creatinine, Ser: 3.7 mg/dL — ABNORMAL HIGH (ref 0.44–1.00)
Glucose, Bld: 102 mg/dL — ABNORMAL HIGH (ref 70–99)
HCT: 30 % — ABNORMAL LOW (ref 36.0–46.0)
Hemoglobin: 10.2 g/dL — ABNORMAL LOW (ref 12.0–15.0)
Potassium: 6.3 mmol/L (ref 3.5–5.1)
Sodium: 137 mmol/L (ref 135–145)
TCO2: 22 mmol/L (ref 22–32)

## 2020-08-21 LAB — SARS CORONAVIRUS 2 BY RT PCR (HOSPITAL ORDER, PERFORMED IN ~~LOC~~ HOSPITAL LAB): SARS Coronavirus 2: NEGATIVE

## 2020-08-21 SURGERY — POSTERIOR LUMBAR FUSION 1 LEVEL
Anesthesia: General

## 2020-08-21 MED ORDER — CHLORHEXIDINE GLUCONATE 0.12 % MT SOLN
OROMUCOSAL | Status: AC
  Filled 2020-08-21: qty 15

## 2020-08-21 MED ORDER — MIDAZOLAM HCL 2 MG/2ML IJ SOLN
INTRAMUSCULAR | Status: AC
  Filled 2020-08-21: qty 2

## 2020-08-21 MED ORDER — CHLORHEXIDINE GLUCONATE CLOTH 2 % EX PADS: 6.0000 | MEDICATED_PAD | Freq: Once | CUTANEOUS | Status: DC

## 2020-08-21 MED ORDER — FENTANYL CITRATE (PF) 250 MCG/5ML IJ SOLN
INTRAMUSCULAR | Status: AC
  Filled 2020-08-21: qty 5

## 2020-08-21 MED ORDER — BUPIVACAINE-EPINEPHRINE (PF) 0.25% -1:200000 IJ SOLN
INTRAMUSCULAR | Status: AC
  Filled 2020-08-21: qty 20

## 2020-08-21 MED ORDER — LIDOCAINE 2% (20 MG/ML) 5 ML SYRINGE
INTRAMUSCULAR | Status: AC
  Filled 2020-08-21: qty 5

## 2020-08-21 MED ORDER — ORAL CARE MOUTH RINSE: 15.0000 mL | Freq: Once | OROMUCOSAL | Status: DC

## 2020-08-21 MED ORDER — CHLORHEXIDINE GLUCONATE 0.12 % MT SOLN: 15.0000 mL | Freq: Once | OROMUCOSAL | Status: DC

## 2020-08-21 MED ORDER — ROCURONIUM BROMIDE 10 MG/ML (PF) SYRINGE
PREFILLED_SYRINGE | INTRAVENOUS | Status: AC
  Filled 2020-08-21: qty 10

## 2020-08-21 MED ORDER — PROPOFOL 10 MG/ML IV BOLUS
INTRAVENOUS | Status: AC
  Filled 2020-08-21: qty 20

## 2020-08-21 MED ORDER — THROMBIN 5000 UNITS EX SOLR
CUTANEOUS | Status: AC
  Filled 2020-08-21: qty 5000

## 2020-08-21 MED ORDER — LACTATED RINGERS IV SOLN: INTRAVENOUS | Status: DC

## 2020-08-21 NOTE — Progress Notes (Signed)
Subjective: The patient is alert and pleasant.  Objective: Vital signs in last 24 hours: Temp:  [98.5 F (36.9 C)] 98.5 F (36.9 C) (02/09 0644) Pulse Rate:  [58] 58 (02/09 0644) Resp:  [18] 18 (02/09 0644) BP: (117)/(58) 117/58 (02/09 0644) SpO2:  [100 %] 100 % (02/09 0644) Weight:  [105.2 kg] 105.2 kg (02/09 0644) Estimated body mass index is 42.43 kg/m as calculated from the following:   Height as of this encounter: 5\' 2"  (1.575 m).   Weight as of this encounter: 105.2 kg.   Intake/Output from previous day: No intake/output data recorded. Intake/Output this shift: No intake/output data recorded.  Physical exam no change  Lab Results: Recent Labs    08/21/20 0812  HGB 10.2*  HCT 30.0*   BMET Recent Labs    08/21/20 0812 08/21/20 0833  NA 137 136  K 6.3* 6.0*  CL 110 107  CO2  --  19*  GLUCOSE 102* 103*  BUN 46* 38*  CREATININE 3.70* 3.53*  CALCIUM  --  8.9    Studies/Results: No results found.  Assessment/Plan: The patient's repeat BMP confirms hyperkalemia and worsening creatinine from her baseline.  Anesthesia does not feel she should have elective surgery presently.  I discussed this with the patient.  I informed her we  need to cancel her surgery and she needs to follow-up with her renal doctors for renal clearance.  I have answered to call my office to reschedule her surgery when she has renal clearance.  LOS: 0 days     Sherry [name] 08/21/2020, 9:47 AM

## 2020-08-21 NOTE — Progress Notes (Signed)
Spoke with care management and the patient will be given a taxi voucher to get home.

## 2020-08-21 NOTE — Progress Notes (Signed)
Patient made aware of the instructions given by her nephrologist, however, the pt has no transportation to get to the nephrologist office or home. Messages left for the hospital clinical social worker and the social worker in the emergency room. Awaiting a call back.

## 2020-08-21 NOTE — Progress Notes (Signed)
CRITICAL VALUE ALERT  Critical Value: K+ 6.3  Date & Time Notied:  08/21/20 @ 0820  Provider Notified: Dr. Marcie Bal  Orders Received/Actions taken: Obtain a Stat BMP

## 2020-08-21 NOTE — Progress Notes (Signed)
[place] picked up from [facility] and given to patient.  Patient instructed to mix 1 pack with 1/3 cup of water and take daily for 5 days.  Patient able to repeat instructions back to nurse.  Patient also instructed to call [facility] and schedule appointment for next week.  Patient verbalized understanding.

## 2020-08-21 NOTE — Progress Notes (Signed)
Notified Deanna at [facility] of patient's potassium results.  Per Deanna, patient needs to stop by [place] when discharged from hospital to pick up medication and will follow-up with PA next week.

## 2020-08-21 NOTE — H&P (Signed)
Subjective: The patient is a 65 year old obese black female who has complained of back and leg pain.  She has failed medical management and was worked up with a lumbar MRI and lumbar x-rays which demonstrated an L4-5 spondylolisthesis, facet arthropathy, etc.  I discussed the situation with the patient.  She has weighed the risks, benefits and alternatives and decided to proceed with an L4-5 decompression, instrumentation and fusion.  Past Medical History:  Diagnosis Date  . Alcohol abuse 05/09/2019  . Alcohol use disorder, severe, dependence ([place]) 08/27/2017  . Alcohol withdrawal ([place]) 08/09/2017  . Anxiety   . ARF (acute renal failure) ([place]) 04/09/2020  . Bilateral low back pain with bilateral sciatica 01/05/2019  . Bipolar and related disorder ([place])   . Blood transfusion without reported diagnosis   . Breast cancer ([place])    right lumpectemy and lymph node   . Cancer ([place]) 09/06/2017  . Chest pain 05/31/2020  . Chest pain, rule out acute myocardial infarction 05/29/2020  . CHF (congestive heart failure) ([place])   . Chronic back pain   . Chronic diastolic CHF (congestive heart failure) ([place]) 05/09/2019  . Chronic kidney disease    "Stage IV" - states due to hypertension  . Chronic kidney disease, stage III (moderate) ([place]) 11/17/2016  . Chronic low back pain 09/06/2017   Disc levels:    No abnormality at L2-3 or above.    L3-4:Mild bulging of the disc.No stenosis.    L4-5: Bilateral facet arthropathy with gaping, fluid-filled joints.  Joint edema. Anterolisthesis would likely occur at this level with  standing and flexion. Disc degeneration with mild bulging of the  disc. Mild narrowing of the lateral recesses and foramina, which  would likely worsen  . Cigarette smoker 12/31/2017  . CKD (chronic kidney disease) stage 4, GFR 15-29 ml/min (HCC) 04/18/2020  . Cyst of ovary   . Depression   . Depression with anxiety 08/21/2016  . Dizziness 02/24/2018  . DOE (dyspnea on exertion)  12/30/2017   12/30/2017   Walked RA x one lap @ 185 stopped due to  Sob/ lightheaded weak with sats 96% at end - Spirometry 12/30/2017  FEV1 1.27 (68%)  Ratio 99 s curvature / abn effort dep portion only    . Dyspnea   . Dyspnea and respiratory abnormality 02/13/2013   Formatting of this note might be different from the original. STORY: PSG on 05/20/12 showed no OSA but upper airway resistance syndrome, AHI 3.9, severe snoring was noted. ESS=8. UARS. Advise positional therapy, weight loss program, regular exercise.  . Essential hypertension 07/06/2017  . GERD (gastroesophageal reflux disease)   . Grief reaction with prolonged bereavement 09/06/2017   Partner of 18 yrs left 2018-pt admits she still loves her  . Hepatitis C    recieved treatment for Hep C  . Hepatitis C virus infection cured after antiviral drug therapy 12/17/2012   Telephone Encounter - Dub Mikes, RN - 12/28/2016 9:54 AM EDT Patient called for HCV RNA test results. EOT 08-26-16 - not detected. 12 week post treatment 12-11-16 - not detected. Informed patient that she was cured. Patient verbalized understanding.        Marland Kitchen History of breast cancer 02/13/2013  . Hx of bipolar disorder 01/31/2016  . Hypertension   . Hypertensive emergency 10/21/2017  . Left-sided weakness   . MDD (major depressive disorder), recurrent severe, without psychosis ([place])   . Medication intolerance 09/06/2017   Remeron-appetite stimulant/Dizziness/Dysphoria  . Microcytic anemia 10/21/2017  . Morbid obesity due  to excess calories ([place]) 02/13/2013   BMI 42.9  . MRSA infection    of breast incision  . Nausea 04/09/2020   Formatting of this note might be different from the original. Added automatically from request for surgery [id number]  . Near syncope 05/29/2020  . Neuroleptic-induced tardive dyskinesia 09/06/2017   Abilify  . Opioid dependence ([place])    Has been treated in [facility] in past  . Opioid use disorder, severe, dependence ([place]) 08/27/2017  . Oral  aphthous ulcer 06/25/2015   Last Assessment & Plan:  Formatting of this note might be different from the original. - Appear to be resolving as a be expected in the case of aphthous stomatitis. - Continue topical therapy with viscous lidocaine.  Recommended trying high-dose anti-inflammatory such as 600 mg of ibuprofen as needed. - She'll return to ENT clinic if symptoms do not resolve.  . Phlebitis after infusion 10/28/2017   Rt antecubital fossa  . Pre-operative cardiovascular examination 06/13/2018  . Restless legs syndrome 02/13/2013   Formatting of this note might be different from the original. IMPRESSION: Possible. Will follow.  . Subacute dyskinesia due to drug 02/13/2013   Formatting of this note might be different from the original. STORY: Tardive dyskinesia and mild limb dyskinesia, LE>UE which likely due to prolonged antipsychotic used, abilify. Couldn't tolerate artane, gabapentin, clonazepam and xenazine.`E1o3L`IMPRESSION: Well tolerated depakote 250mg  bid, mood aspect is better as well. Will increase to 500mg  bid. ADR was discussed.  RTC 4-6 weeks.  . Substance abuse ([place])   . Substance induced mood disorder ([place]) 08/13/2017  . TIA (transient ischemic attack) 2020   P/w BP >230/120 and neurologic symptoms, presumed TIA    Past Surgical History:  Procedure Laterality Date  . BACK SURGERY  1990  . BREAST SURGERY Right 2010   "breast cancer survivor" - states partial mastectomy and nodes  . COLONOSCOPY     [facility]  . ESOPHAGOGASTRODUODENOSCOPY     [facility]  . ESOPHAGOGASTRODUODENOSCOPY  04/18/2020   [facility]  . LAPAROSCOPIC CHOLECYSTECTOMY  2002  . LAPAROSCOPIC INCISIONAL / UMBILICAL / VENTRAL HERNIA REPAIR  04/13/2018   with BARD 15x 82NK mesh (supraumbilical)  . LAPAROSCOPIC LYSIS OF ADHESIONS  07/12/2018   Procedure: LAPAROSCOPIC LYSIS OF ADHESIONS;  Surgeon: Isabel Caprice, MD;  Location: [facility];  Service: Gynecology;;  . MULTIPLE TOOTH EXTRACTIONS     . MYOMECTOMY     x 2 prior to hysterectomy  . ROBOTIC ASSISTED BILATERAL SALPINGO OOPHERECTOMY Right 07/12/2018   Procedure: XI ROBOTIC ASSISTED RIGHT SALPINGO OOPHORECTOMY;  Surgeon: Isabel Caprice, MD;  Location: [facility];  Service: Gynecology;  Laterality: Right;  . TOTAL ABDOMINAL HYSTERECTOMY     fibroids   . UPPER GASTROINTESTINAL ENDOSCOPY    . WISDOM TOOTH EXTRACTION      Allergies  Allergen Reactions  . Abilify [Aripiprazole] Other (See Comments)    Tardive dyskinesia Oral  . Remeron [Mirtazapine] Other (See Comments)    Wgt stimulation /gain, Dizziness, Patient says "can tolerate"  . Trazodone And Nefazodone Other (See Comments)    Nightmares/sleep diturbance  . Flexeril [Cyclobenzaprine] Other (See Comments)    Pt states Flexeril makes her feel depressed   . Amoxicillin Diarrhea and Other (See Comments)    NOTE the patient has had PCN WITHOUT reaction Has patient had a PCN reaction causing immediate rash, facial/tongue/throat swelling, SOB or lightheadedness with hypotension: No Has patient had a PCN reaction causing severe rash involving mucus membranes or  skin necrosis: No Has patient had a PCN reaction that required hospitalization: No Has patient had a PCN reaction occurring within the last 10 years: No If all of the above answers are "NO", then may proceed with Cephalosporin use.     Social History   Tobacco Use  . Smoking status: Current Every Day Smoker    Packs/day: 1.00    Types: Cigarettes  . Smokeless tobacco: Never Used  Substance Use Topics  . Alcohol use: Not Currently    Comment: hx over 30 years    Family History  Problem Relation Age of Onset  . Heart attack Mother   . Breast cancer Mother 66  . Dementia Mother   . Cancer Father 55       died of bleeding from kidneys  . Colon cancer Neg Hx   . Esophageal cancer Neg Hx   . Stomach cancer Neg Hx   . Rectal cancer Neg Hx    Prior to Admission medications   Medication Sig Start Date End  Date Taking? Authorizing Provider  amLODipine (NORVASC) 10 MG tablet Take 1 tablet (10 mg total) by mouth daily. 09/13/19  Yes Forrest Moron, MD  Cholecalciferol (VITAMIN D3) 50 MCG (2000 UT) capsule TAKE 1 CAPSULE BY MOUTH DAILY Patient taking differently: Take 2,000 Units by mouth daily. 07/31/20  Yes Wendie Agreste, MD  diphenhydramine-acetaminophen (TYLENOL PM) 25-500 MG TABS tablet Take 1 tablet by mouth at bedtime as needed.   Yes [provider]  ferrous sulfate (FEROSUL) 325 (65 FE) MG tablet Take 1 tablet (325 mg total) by mouth daily with breakfast. 06/18/20  Yes Wendie Agreste, MD  FLUoxetine (PROZAC) 40 MG capsule Take 40 mg by mouth daily.  11/09/18  Yes [provider]  gabapentin (NEURONTIN) 800 MG tablet Take 800 mg by mouth 3 (three) times daily.   Yes [provider]  hydrALAZINE (APRESOLINE) 25 MG tablet Take 1 tablet (25 mg total) by mouth 3 (three) times daily. Patient taking differently: Take 100 mg by mouth 3 (three) times daily. 05/11/19  Yes Danford, Suann Larry, MD  HYDROcodone-acetaminophen (NORCO) 7.5-325 MG tablet Take 1 tablet by mouth every 6 (six) hours as needed for moderate pain.   Yes [provider]  INGREZZA 80 MG CAPS Take 80 mg by mouth daily. 10/15/17  Yes [provider]  isosorbide mononitrate (IMDUR) 60 MG 24 hr tablet Take 1 tablet (60 mg total) by mouth daily. 06/18/20  Yes Wendie Agreste, MD  labetalol (NORMODYNE) 300 MG tablet Take 300 mg by mouth 2 (two) times daily.   Yes [provider]  methocarbamol (ROBAXIN) 500 MG tablet Take 500-1,000 mg by mouth every 6 (six) hours as needed for spasms. 08/07/20  Yes [provider]  ondansetron (ZOFRAN) 4 MG tablet TAKE 1 TABLET BY MOUTH EVERY 8 HOURS AS NEEDED FOR NAUSEA OR VOMITING Patient taking differently: Take 4 mg by mouth every 8 (eight) hours as needed for nausea or vomiting. 01/12/20  Yes Wendie Agreste, MD  pantoprazole (PROTONIX)  20 MG tablet Take 1 tablet (20 mg total) by mouth 2 (two) times daily. 06/01/20  Yes Aline August, MD     Review of Systems  Positive ROS: As above  All other systems have been reviewed and were otherwise negative with the exception of those mentioned in the HPI and as above.  Objective: Vital signs in last 24 hours: Temp:  [98.5 F (36.9 C)] 98.5 F (36.9 C) (02/09  6759) Pulse Rate:  [58] 58 (02/09 0644) Resp:  [18] 18 (02/09 0644) BP: (117)/(58) 117/58 (02/09 0644) SpO2:  [100 %] 100 % (02/09 0644) Weight:  [105.2 kg] 105.2 kg (02/09 0644) Estimated body mass index is 42.43 kg/m as calculated from the following:   Height as of this encounter: 5\' 2"  (1.575 m).   Weight as of this encounter: 105.2 kg.   General Appearance: Alert Head: Normocephalic, without obvious abnormality, atraumatic Eyes: PERRL, conjunctiva/corneas clear, EOM's intact,    Ears: Normal  Throat: Normal  Neck: Supple, Back: unremarkable Lungs: Clear to auscultation bilaterally, respirations unlabored Heart: Regular rate and rhythm, no murmur, rub or gallop Abdomen: Soft, non-tender Extremities: Extremities normal, atraumatic, no cyanosis or edema Skin: unremarkable  NEUROLOGIC:   Mental status: alert and oriented,Motor Exam - grossly normal Sensory Exam - grossly normal Reflexes:  Coordination - grossly normal Gait - grossly normal Balance - grossly normal Cranial Nerves: I: smell Not tested  II: visual acuity  OS: Normal  OD: Normal   II: visual fields Full to confrontation  II: pupils Equal, round, reactive to light  III,VII: ptosis None  III,IV,VI: extraocular muscles  Full ROM  V: mastication Normal  V: facial light touch sensation  Normal  V,VII: corneal reflex  Present  VII: facial muscle function - upper  Normal  VII: facial muscle function - lower Normal  VIII: hearing Not tested  IX: soft palate elevation  Normal  IX,X: gag reflex Present  XI: trapezius strength  5/5  XI:  sternocleidomastoid strength 5/5  XI: neck flexion strength  5/5  XII: tongue strength  Normal    Data Review Lab Results  Component Value Date   WBC 5.0 08/15/2020   HGB 10.2 (L) 08/21/2020   HCT 30.0 (L) 08/21/2020   MCV 93.2 08/15/2020   PLT 247 08/15/2020   Lab Results  Component Value Date   NA 137 08/21/2020   K 6.3 (HH) 08/21/2020   CL 110 08/21/2020   CO2 22 08/15/2020   BUN 46 (H) 08/21/2020   CREATININE 3.70 (H) 08/21/2020   GLUCOSE 102 (H) 08/21/2020   Lab Results  Component Value Date   INR 1.0 07/25/2020    Assessment/Plan: L4-5 spondylolisthesis, spinal stenosis, facet arthropathy, lumbago, lumbar radiculopathy, neurogenic claudication: I have discussed the situation with the patient.  I reviewed her imaging studies with her and pointed out that matters.  We have discussed the various treatment options including surgery.  I have described the surgical treatment option of an L4-5 decompression, instrumentation and fusion.  I have shown her surgical models.  We have discussed the risk, benefits, alternatives, expected postop course, and likelihood of achieving our goals with surgery.  I have answered all her questions.  She has decided to proceed with surgery.  We are awaiting a repeat BMP and the Covid results.   Ophelia Charter 08/21/2020 9:23 AM

## 2020-08-21 NOTE — Progress Notes (Signed)
Dr. Marcie Bal and Dr. Arnoldo Morale aware of patient's potassium.  Per Dr. Arnoldo Morale and Dr. Marcie Bal, patient is to be discharged and to follow-up with nephrologist and/or PCP.

## 2020-08-21 NOTE — Progress Notes (Signed)
Transportation services took over and transportation has been set up to arrive by 1215. Pt made aware and in no apparent distress.

## 2020-08-21 NOTE — Progress Notes (Signed)
Pt escorted out to the main entrance of the facility and placed in the transportation vehicle. AAOx4. Pt in no apparent distress or pain.

## 2020-08-23 ENCOUNTER — Other Ambulatory Visit: Payer: Self-pay | Admitting: Family Medicine

## 2020-08-23 DIAGNOSIS — D509 Iron deficiency anemia, unspecified: Secondary | ICD-10-CM

## 2020-08-26 ENCOUNTER — Encounter (HOSPITAL_BASED_OUTPATIENT_CLINIC_OR_DEPARTMENT_OTHER): Payer: Self-pay | Admitting: *Deleted

## 2020-08-26 ENCOUNTER — Other Ambulatory Visit: Payer: Self-pay

## 2020-08-26 ENCOUNTER — Emergency Department (HOSPITAL_BASED_OUTPATIENT_CLINIC_OR_DEPARTMENT_OTHER)
Admission: EM | Admit: 2020-08-26 | Discharge: 2020-08-26 | Disposition: A | Discharge status: Home / Self Care | Payer: Medicare HMO | Attending: Emergency Medicine | Admitting: Emergency Medicine

## 2020-08-26 DIAGNOSIS — F17210 Nicotine dependence, cigarettes, uncomplicated: Secondary | ICD-10-CM | POA: Diagnosis not present

## 2020-08-26 DIAGNOSIS — I5032 Chronic diastolic (congestive) heart failure: Secondary | ICD-10-CM | POA: Diagnosis not present

## 2020-08-26 DIAGNOSIS — Z79899 Other long term (current) drug therapy: Secondary | ICD-10-CM | POA: Diagnosis not present

## 2020-08-26 DIAGNOSIS — Z853 Personal history of malignant neoplasm of breast: Secondary | ICD-10-CM | POA: Diagnosis not present

## 2020-08-26 DIAGNOSIS — I130 Hypertensive heart and chronic kidney disease with heart failure and stage 1 through stage 4 chronic kidney disease, or unspecified chronic kidney disease: Secondary | ICD-10-CM | POA: Diagnosis not present

## 2020-08-26 DIAGNOSIS — M5441 Lumbago with sciatica, right side: Secondary | ICD-10-CM | POA: Diagnosis not present

## 2020-08-26 DIAGNOSIS — N184 Chronic kidney disease, stage 4 (severe): Secondary | ICD-10-CM | POA: Diagnosis not present

## 2020-08-26 DIAGNOSIS — M545 Low back pain, unspecified: Secondary | ICD-10-CM | POA: Diagnosis present

## 2020-08-26 DIAGNOSIS — M5431 Sciatica, right side: Secondary | ICD-10-CM | POA: Insufficient documentation

## 2020-08-26 DIAGNOSIS — M79604 Pain in right leg: Secondary | ICD-10-CM | POA: Insufficient documentation

## 2020-08-26 MED ORDER — AMLODIPINE BESYLATE 5 MG PO TABS
ORAL_TABLET | ORAL | Status: AC
  Filled 2020-08-26: qty 2

## 2020-08-26 MED ORDER — AMLODIPINE BESYLATE 5 MG PO TABS
10.0000 mg | ORAL_TABLET | Freq: Once | ORAL | Status: AC
  Administered 2020-08-26: 10 mg via ORAL

## 2020-08-26 MED ORDER — HYDROCHLOROTHIAZIDE 25 MG PO TABS
ORAL_TABLET | ORAL | Status: AC
  Filled 2020-08-26: qty 1

## 2020-08-26 MED ORDER — HYDRALAZINE HCL 25 MG PO TABS
25.0000 mg | ORAL_TABLET | Freq: Once | ORAL | Status: AC
  Administered 2020-08-26: 25 mg via ORAL

## 2020-08-26 MED ORDER — DEXAMETHASONE SODIUM PHOSPHATE 10 MG/ML IJ SOLN
10.0000 mg | Freq: Once | INTRAMUSCULAR | Status: AC
  Administered 2020-08-26: 10 mg via INTRAMUSCULAR
  Filled 2020-08-26: qty 1

## 2020-08-26 MED ORDER — LABETALOL HCL 100 MG PO TABS
ORAL_TABLET | ORAL | Status: AC
  Administered 2020-08-26: 100 mg
  Filled 2020-08-26: qty 1

## 2020-08-26 MED ORDER — OXYCODONE-ACETAMINOPHEN 5-325 MG PO TABS
1.0000 | ORAL_TABLET | Freq: Once | ORAL | Status: AC
  Administered 2020-08-26: 1 via ORAL
  Filled 2020-08-26: qty 1

## 2020-08-26 MED ORDER — HYDRALAZINE HCL 25 MG PO TABS
ORAL_TABLET | ORAL | Status: AC
  Filled 2020-08-26: qty 2

## 2020-08-26 MED ORDER — HYDROCODONE-ACETAMINOPHEN 5-325 MG PO TABS: 1.0000 | ORAL_TABLET | Freq: Four times a day (QID) | ORAL | 0 refills | Status: DC | PRN

## 2020-08-26 NOTE — ED Triage Notes (Signed)
Presents today with back pain, states she was scheduled last wed for back pain but due to Hyperkalemia case was cancelled. Lower back pain that radiates to rt leg. Has taken Gabapentin and Vicodin, last taken this am at approx 0600hrs. Dr. Frederich Cha, MD is her MD

## 2020-08-26 NOTE — ED Provider Notes (Addendum)
[facility] EMERGENCY DEPARTMENT Provider Note   CSN: [id number] Arrival date & time: 08/26/20  1807     History Chief Complaint  Patient presents with  . Back Pain    Sherry [name] is a 65 y.o. female.  HPI Patient is a 65 year old female with a complex medical history as noted below.  She presents the emergency department today for right-sided back pain that radiates down the right leg.  Patient was scheduled for a posterior lumbar interbody fusion with Dr. Newman Pies with neurosurgery.  This was supposed to happen 5 days ago but she was found to be hyperkalemic and the surgery was canceled.  Over the past few days she began experiencing acute on chronic right low back pain.  No recent falls or injuries.  No numbness or weakness.  She states she reached out to her surgeon this morning but was unable to get a call back so she came to the emergency department for evaluation.  No other complaints at this time.    Past Medical History:  Diagnosis Date  . Alcohol abuse 05/09/2019  . Alcohol use disorder, severe, dependence ([place]) 08/27/2017  . Alcohol withdrawal ([place]) 08/09/2017  . Anxiety   . ARF (acute renal failure) ([place]) 04/09/2020  . Bilateral low back pain with bilateral sciatica 01/05/2019  . Bipolar and related disorder ([place])   . Blood transfusion without reported diagnosis   . Breast cancer ([place])    right lumpectemy and lymph node   . Cancer ([place]) 09/06/2017  . Chest pain 05/31/2020  . Chest pain, rule out acute myocardial infarction 05/29/2020  . CHF (congestive heart failure) ([place])   . Chronic back pain   . Chronic diastolic CHF (congestive heart failure) ([place]) 05/09/2019  . Chronic kidney disease    "Stage IV" - states due to hypertension  . Chronic kidney disease, stage III (moderate) ([place]) 11/17/2016  . Chronic low back pain 09/06/2017   Disc levels:    No abnormality at L2-3 or above.    L3-4:Mild bulging of the disc.No stenosis.    L4-5: Bilateral  facet arthropathy with gaping, fluid-filled joints.  Joint edema. Anterolisthesis would likely occur at this level with  standing and flexion. Disc degeneration with mild bulging of the  disc. Mild narrowing of the lateral recesses and foramina, which  would likely worsen  . Cigarette smoker 12/31/2017  . CKD (chronic kidney disease) stage 4, GFR 15-29 ml/min (HCC) 04/18/2020  . Cyst of ovary   . Depression   . Depression with anxiety 08/21/2016  . Dizziness 02/24/2018  . DOE (dyspnea on exertion) 12/30/2017   12/30/2017   Walked RA x one lap @ 185 stopped due to  Sob/ lightheaded weak with sats 96% at end - Spirometry 12/30/2017  FEV1 1.27 (68%)  Ratio 99 s curvature / abn effort dep portion only    . Dyspnea   . Dyspnea and respiratory abnormality 02/13/2013   Formatting of this note might be different from the original. STORY: PSG on 05/20/12 showed no OSA but upper airway resistance syndrome, AHI 3.9, severe snoring was noted. ESS=8. UARS. Advise positional therapy, weight loss program, regular exercise.  . Essential hypertension 07/06/2017  . GERD (gastroesophageal reflux disease)   . Grief reaction with prolonged bereavement 09/06/2017   Partner of 18 yrs left 2018-pt admits she still loves her  . Hepatitis C    recieved treatment for Hep C  . Hepatitis C virus infection cured after antiviral drug therapy 12/17/2012  Telephone Encounter - Dub Mikes, RN - 12/28/2016 9:54 AM EDT Patient called for HCV RNA test results. EOT 08-26-16 - not detected. 12 week post treatment 12-11-16 - not detected. Informed patient that she was cured. Patient verbalized understanding.        Marland Kitchen History of breast cancer 02/13/2013  . Hx of bipolar disorder 01/31/2016  . Hypertension   . Hypertensive emergency 10/21/2017  . Left-sided weakness   . MDD (major depressive disorder), recurrent severe, without psychosis ([place])   . Medication intolerance 09/06/2017   Remeron-appetite stimulant/Dizziness/Dysphoria  . Microcytic  anemia 10/21/2017  . Morbid obesity due to excess calories ([place]) 02/13/2013   BMI 42.9  . MRSA infection    of breast incision  . Nausea 04/09/2020   Formatting of this note might be different from the original. Added automatically from request for surgery [id number]  . Near syncope 05/29/2020  . Neuroleptic-induced tardive dyskinesia 09/06/2017   Abilify  . Opioid dependence ([place])    Has been treated in [facility] in past  . Opioid use disorder, severe, dependence ([place]) 08/27/2017  . Oral aphthous ulcer 06/25/2015   Last Assessment & Plan:  Formatting of this note might be different from the original. - Appear to be resolving as a be expected in the case of aphthous stomatitis. - Continue topical therapy with viscous lidocaine.  Recommended trying high-dose anti-inflammatory such as 600 mg of ibuprofen as needed. - She'll return to ENT clinic if symptoms do not resolve.  . Phlebitis after infusion 10/28/2017   Rt antecubital fossa  . Pre-operative cardiovascular examination 06/13/2018  . Restless legs syndrome 02/13/2013   Formatting of this note might be different from the original. IMPRESSION: Possible. Will follow.  . Subacute dyskinesia due to drug 02/13/2013   Formatting of this note might be different from the original. STORY: Tardive dyskinesia and mild limb dyskinesia, LE>UE which likely due to prolonged antipsychotic used, abilify. Couldn't tolerate artane, gabapentin, clonazepam and xenazine.`E1o3L`IMPRESSION: Well tolerated depakote 250mg  bid, mood aspect is better as well. Will increase to 500mg  bid. ADR was discussed.  RTC 4-6 weeks.  . Substance abuse ([place])   . Substance induced mood disorder ([place]) 08/13/2017  . TIA (transient ischemic attack) 2020   P/w BP >230/120 and neurologic symptoms, presumed TIA    Patient Active Problem List   Diagnosis Date Noted  . Anxiety   . Bipolar and related disorder ([place])   . Blood transfusion without reported diagnosis   . Breast cancer ([place])   .  CHF (congestive heart failure) ([place])   . Chronic back pain   . Chronic kidney disease   . Depression   . Dyspnea   . Hepatitis C   . MRSA infection   . Opioid dependence ([place])   . Substance abuse ([place])   . Chest pain 05/31/2020  . Near syncope 05/29/2020  . Chest pain, rule out acute myocardial infarction 05/29/2020  . CKD (chronic kidney disease) stage 4, GFR 15-29 ml/min (HCC) 04/18/2020  . ARF (acute renal failure) ([place]) 04/09/2020  . Nausea 04/09/2020  . Chronic diastolic CHF (congestive heart failure) ([place]) 05/09/2019  . GERD (gastroesophageal reflux disease) 05/09/2019  . Alcohol abuse 05/09/2019  . Hypertension 05/08/2019  . Bilateral low back pain with bilateral sciatica 01/05/2019  . TIA (transient ischemic attack) 2020  . Cyst of ovary   . Pre-operative cardiovascular examination 06/13/2018  . Dizziness 02/24/2018  . Left-sided weakness 02/24/2018  . Cigarette smoker 12/31/2017  . DOE (dyspnea on  exertion) 12/30/2017  . Phlebitis after infusion 10/28/2017  . Hypertensive emergency 10/21/2017  . Microcytic anemia 10/21/2017  . Medication intolerance 09/06/2017  . Neuroleptic-induced tardive dyskinesia 09/06/2017  . Cancer ([place]) 09/06/2017  . Chronic low back pain 09/06/2017  . Grief reaction with prolonged bereavement 09/06/2017  . Opioid use disorder, severe, dependence ([place]) 08/27/2017  . Alcohol use disorder, severe, dependence ([place]) 08/27/2017  . Substance induced mood disorder ([place]) 08/13/2017  . MDD (major depressive disorder), recurrent severe, without psychosis ([place])   . Alcohol withdrawal ([place]) 08/09/2017  . Essential hypertension 07/06/2017  . Chronic kidney disease, stage III (moderate) ([place]) 11/17/2016  . Depression with anxiety 08/21/2016  . Hx of bipolar disorder 01/31/2016  . Oral aphthous ulcer 06/25/2015  . History of breast cancer 02/13/2013  . Morbid obesity due to excess calories ([place]) 02/13/2013  . Restless legs syndrome 02/13/2013  .  Subacute dyskinesia due to drug 02/13/2013  . Dyspnea and respiratory abnormality 02/13/2013  . Hepatitis C virus infection cured after antiviral drug therapy 12/17/2012    Past Surgical History:  Procedure Laterality Date  . BACK SURGERY  1990  . BREAST SURGERY Right 2010   "breast cancer survivor" - states partial mastectomy and nodes  . COLONOSCOPY     [facility]  . ESOPHAGOGASTRODUODENOSCOPY     [facility]  . ESOPHAGOGASTRODUODENOSCOPY  04/18/2020   [facility]  . LAPAROSCOPIC CHOLECYSTECTOMY  2002  . LAPAROSCOPIC INCISIONAL / UMBILICAL / VENTRAL HERNIA REPAIR  04/13/2018   with BARD 15x 00QQ mesh (supraumbilical)  . LAPAROSCOPIC LYSIS OF ADHESIONS  07/12/2018   Procedure: LAPAROSCOPIC LYSIS OF ADHESIONS;  Surgeon: Isabel Caprice, MD;  Location: [facility] OR[facility];  Service: Gynecology;;  . MULTIPLE TOOTH EXTRACTIONS    . MYOMECTOMY     x 2 prior to hysterectomy  . ROBOTIC ASSISTED BILATERAL SALPINGO OOPHERECTOMY Right 07/12/2018   Procedure: XI ROBOTIC ASSISTED RIGHT SALPINGO OOPHORECTOMY;  Surgeon: Isabel Caprice, MD;  Location: [facility];  Service: Gynecology;  Laterality: Right;  . TOTAL ABDOMINAL HYSTERECTOMY     fibroids   . UPPER GASTROINTESTINAL ENDOSCOPY    . WISDOM TOOTH EXTRACTION       OB History    Gravida  2   Para  0   Term      Preterm      AB  0   Living        SAB      IAB      Ectopic      Multiple      Live Births              Family History  Problem Relation Age of Onset  . Heart attack Mother   . Breast cancer Mother 83  . Dementia Mother   . Cancer Father 37       died of bleeding from kidneys  . Colon cancer Neg Hx   . Esophageal cancer Neg Hx   . Stomach cancer Neg Hx   . Rectal cancer Neg Hx     Social History   Tobacco Use  . Smoking status: Current Every Day Smoker    Packs/day: 1.00    Types: Cigarettes  . Smokeless tobacco: Never Used  Vaping Use  . Vaping Use: Former  . Start date: 06/27/2013   . Quit date: 06/14/2017  . Devices: Apple Cinnamon-0 mg  Substance Use Topics  . Alcohol use: Not Currently    Comment: hx over 30 years  .  Drug use: Not Currently    Comment: h/o IVD use    Home Medications Prior to Admission medications   Medication Sig Start Date End Date Taking? Authorizing Provider  HYDROcodone-acetaminophen (NORCO/VICODIN) 5-325 MG tablet Take 1 tablet by mouth every 6 (six) hours as needed. 08/26/20  Yes Rayna Sexton, PA-C  amLODipine (NORVASC) 10 MG tablet Take 1 tablet (10 mg total) by mouth daily. 09/13/19   Forrest Moron, MD  Cholecalciferol (VITAMIN D3) 50 MCG (2000 UT) capsule TAKE 1 CAPSULE BY MOUTH DAILY Patient taking differently: Take 2,000 Units by mouth daily. 07/31/20   Wendie Agreste, MD  FEROSUL 325 (65 Fe) MG tablet TAKE 1 TABLET BY MOUTH DAILY WITH BREAKFAST 08/23/20   Wendie Agreste, MD  FLUoxetine (PROZAC) 40 MG capsule Take 40 mg by mouth daily.  11/09/18   [provider]  gabapentin (NEURONTIN) 800 MG tablet Take 800 mg by mouth 3 (three) times daily.    [provider]  hydrALAZINE (APRESOLINE) 25 MG tablet Take 1 tablet (25 mg total) by mouth 3 (three) times daily. Patient taking differently: Take 100 mg by mouth 3 (three) times daily. 05/11/19   Danford, Suann Larry, MD  INGREZZA 80 MG CAPS Take 80 mg by mouth daily. 10/15/17   [provider]  isosorbide mononitrate (IMDUR) 60 MG 24 hr tablet TAKE 1 TABLET BY MOUTH DAILY 08/23/20   Wendie Agreste, MD  labetalol (NORMODYNE) 300 MG tablet Take 300 mg by mouth 2 (two) times daily.    [provider]  methocarbamol (ROBAXIN) 500 MG tablet Take 500-1,000 mg by mouth every 6 (six) hours as needed for spasms. 08/07/20   [provider]  ondansetron (ZOFRAN) 4 MG tablet TAKE 1 TABLET BY MOUTH EVERY 8 HOURS AS NEEDED FOR NAUSEA OR VOMITING Patient taking differently: Take 4 mg by mouth every 8 (eight) hours as needed for nausea or vomiting. 01/12/20    Wendie Agreste, MD  pantoprazole (PROTONIX) 20 MG tablet Take 1 tablet (20 mg total) by mouth 2 (two) times daily. 06/01/20   Aline August, MD    Allergies    Abilify [aripiprazole], Remeron [mirtazapine], Trazodone and nefazodone, Flexeril [cyclobenzaprine], and Amoxicillin  Review of Systems   Review of Systems  Musculoskeletal: Positive for back pain and myalgias.  Neurological: Negative for weakness and numbness.   Physical Exam Updated Vital Signs BP (!) 184/84   Pulse 72   Temp 98.5 F (36.9 C) (Oral)   Resp 16   Ht 5\' 2"  (1.575 m)   Wt 105.2 kg   SpO2 99%   BMI 42.43 kg/m   Physical Exam Vitals and nursing note reviewed.  Constitutional:      General: She is not in acute distress.    Appearance: She is well-developed.  HENT:     Head: Normocephalic and atraumatic.     Right Ear: External ear normal.     Left Ear: External ear normal.  Eyes:     General: No scleral icterus.       Right eye: No discharge.        Left eye: No discharge.     Conjunctiva/sclera: Conjunctivae normal.  Neck:     Trachea: No tracheal deviation.  Cardiovascular:     Rate and Rhythm: Normal rate.  Pulmonary:     Effort: Pulmonary effort is normal. No respiratory distress.     Breath sounds: No stridor.  Abdominal:     General: There is no distension.  Musculoskeletal:        General: Tenderness present. No swelling or deformity.     Cervical back: Neck supple.     Comments: Moderate TTP noted diffusely along the right lumbar musculature as well as the right gluteal musculature.  Distal sensation intact in both lower extremities.  Strength is 5 out of 5 in the lower extremities.  Palpable pedal pulses.  Patient is ambulatory.  No midline spine pain.  Skin:    General: Skin is warm and dry.     Findings: No rash.  Neurological:     Mental Status: She is alert.     Cranial Nerves: Cranial nerve deficit: no gross deficits.    ED Results / Procedures / Treatments    Labs (all labs ordered are listed, but only abnormal results are displayed) Labs Reviewed - No data to display  EKG None  Radiology No results found.  Procedures Procedures   Medications Ordered in ED Medications  dexamethasone (DECADRON) injection 10 mg (10 mg Intramuscular Given 08/26/20 2106)  oxyCODONE-acetaminophen (PERCOCET/ROXICET) 5-325 MG per tablet 1 tablet (1 tablet Oral Given 08/26/20 2106)  hydrALAZINE (APRESOLINE) tablet 25 mg (25 mg Oral Given 08/26/20 2120)  amLODipine (NORVASC) tablet 10 mg (10 mg Oral Given 08/26/20 2120)  labetalol (NORMODYNE) 100 MG tablet (100 mg  Given 08/26/20 2122)   ED Course  I have reviewed the triage vital signs and the nursing notes.  Pertinent labs & imaging results that were available during my care of the patient were reviewed by me and considered in my medical decision making (see chart for details).  Clinical Course as of 08/26/20 2201  Mon Aug 26, 2020  2120 After patient was discharged she was found to be extremely hypertensive at 262/111.  No chest pain or shortness of breath.  Patient has not taken her evening hypertension medications.  We will give these medications to her and continue to monitor. [LJ]    Clinical Course User Index [LJ] Rayna Sexton, PA-C   MDM Rules/Calculators/A&P                          Patient here with acute on chronic low back pain that seems to be consistent with right-sided sciatica.  No midline spine pain.  No red flags.  Patient is ambulatory.  Patient given a dose of IM Decadron here in the emergency department as well as a Percocet for pain.  We will send a very short course of Vicodin to her pharmacy for breakthrough pain over the next few days.  I recommended a prednisone taper but patient states that she does not tolerate this medication and declined.  Recommended that she follow-up with her surgeon.  Discussed return precautions.    Nursing staff notified me at the time of discharge that  patient's blood pressure was elevated.  It was 262/111.  No new complaints.  Patient typically takes her home medications for hypertension in the evening.  She was given these medications and monitored in the ED.  Blood pressure now 184/84.  We will discharge her at this time.  Her questions were answered and she was amicable at the time of discharge.  Final Clinical Impression(s) / ED Diagnoses Final diagnoses:  Sciatica of right side   Rx / DC Orders ED Discharge Orders         Ordered    HYDROcodone-acetaminophen (NORCO/VICODIN) 5-325 MG tablet  Every 6 hours PRN  08/26/20 2056           Rayna Sexton, PA-C 08/26/20 2056    Rayna Sexton, PA-C 08/26/20 2201    Tegeler, Gwenyth Allegra, MD 08/27/20 0010

## 2020-08-26 NOTE — Discharge Instructions (Signed)
Like we discussed, I am prescribing you a short course of Vicodin.  This is a strong narcotic.  It can be constipating.  Please stay hydrated and consider taking stool softener along with it.  Do not operate a motor vehicle after taking it.  Do not mix with alcohol.  Please follow-up with Dr. Arnoldo Morale, your surgeon.  You can always return to the emergency department if your symptoms worsen.  It was a pleasure to meet you.

## 2020-08-28 ENCOUNTER — Other Ambulatory Visit: Payer: Self-pay | Admitting: Family Medicine

## 2020-08-28 NOTE — Telephone Encounter (Signed)
  Notes to clinic:  medication filled by a historical provider  Review for refill    Requested Prescriptions  Pending Prescriptions Disp Refills   gabapentin (NEURONTIN) 800 MG tablet [Pharmacy Med Name: GABAPENTIN 800MG  TABLETS] 90 tablet     Sig: TAKE 1 TABLET(800 MG) BY MOUTH THREE TIMES DAILY      Neurology: Anticonvulsants - gabapentin Passed - 08/28/2020  3:17 PM      Passed - Valid encounter within last 12 months    Recent Outpatient Visits           1 month ago Preoperative evaluation to rule out surgical contraindication   [facility], Ranell Patrick, MD   2 months ago CKD (chronic kidney disease) stage 4, GFR 15-29 ml/min [facility])   [facility], Ranell Patrick, MD   3 months ago Chest pain, unspecified type   [facility], Ranell Patrick, MD   3 months ago Abdominal pain, chronic, epigastric   [facility], Ranell Patrick, MD   4 months ago Anemia in stage 3 chronic kidney disease, unspecified whether stage 3a or 3b CKD   [facility] at [facility], Fenton Malling, MD

## 2020-09-02 DIAGNOSIS — N184 Chronic kidney disease, stage 4 (severe): Secondary | ICD-10-CM | POA: Diagnosis not present

## 2020-09-02 DIAGNOSIS — D631 Anemia in chronic kidney disease: Secondary | ICD-10-CM | POA: Diagnosis not present

## 2020-09-02 DIAGNOSIS — R6889 Other general symptoms and signs: Secondary | ICD-10-CM | POA: Diagnosis not present

## 2020-09-02 DIAGNOSIS — N189 Chronic kidney disease, unspecified: Secondary | ICD-10-CM | POA: Diagnosis not present

## 2020-09-02 DIAGNOSIS — I129 Hypertensive chronic kidney disease with stage 1 through stage 4 chronic kidney disease, or unspecified chronic kidney disease: Secondary | ICD-10-CM | POA: Diagnosis not present

## 2020-09-02 DIAGNOSIS — N2581 Secondary hyperparathyroidism of renal origin: Secondary | ICD-10-CM | POA: Diagnosis not present

## 2020-09-08 NOTE — Progress Notes (Signed)
Sherry [name] is a 65 y.o. female client who presents to [facility] outpatient for a scheduled SAIOP intake appointment with this Probation officer. Client reports pain medication and gabapentin to be her drug(s) of choice. Client was assessed on 04/25/2020 and meets criteria for ASAM Level 2.1 treatment. Client is being recommended for Substance Abuse Intensive Outpatient Program (SAIOP). Client completed a person-centered treatment plan and a comprehensive prevention & crisis plan with this Probation officer. Client has agreed to attend [facility] group therapy on Mondays, Wednesdays, and Fridays from 9:00am - 12:00pm. Group rules and group expectations were discussed with client during today's session. Client did/did not express any concerns regarding group rules and expectations.   Allyne Gee, Counselor

## 2020-09-10 DIAGNOSIS — R6889 Other general symptoms and signs: Secondary | ICD-10-CM | POA: Diagnosis not present

## 2020-09-10 DIAGNOSIS — I129 Hypertensive chronic kidney disease with stage 1 through stage 4 chronic kidney disease, or unspecified chronic kidney disease: Secondary | ICD-10-CM | POA: Diagnosis not present

## 2020-09-23 ENCOUNTER — Other Ambulatory Visit: Payer: Self-pay | Admitting: Family Medicine

## 2020-09-23 MED ORDER — GABAPENTIN 800 MG PO TABS: ORAL_TABLET | ORAL | 0 refills | Status: DC

## 2020-09-23 NOTE — Telephone Encounter (Signed)
Medication Refill - Medication:  gabapentin (NEURONTIN) 800 MG tablet 90 tablet 0 08/29/2020    Sig: TAKE 1 TABLET(800 MG) BY MOUTH THREE TIMES DAILY      Has the patient contacted their pharmacy? Yes  (Agent: If no, request that the patient contact the pharmacy for the refill.) (Agent: If yes, when and what did the pharmacy advise?) call dr   Preferred Pharmacy (with phone number or street name):  [id number] - [place], [place] - [address] AT [place] Phone:  [phone]  Fax:  [phone]       Agent: Please be advised that RX refills may take up to 3 business days. We ask that you follow-up with your pharmacy.

## 2020-09-24 DIAGNOSIS — E872 Acidosis: Secondary | ICD-10-CM | POA: Diagnosis not present

## 2020-09-24 DIAGNOSIS — Z8619 Personal history of other infectious and parasitic diseases: Secondary | ICD-10-CM | POA: Diagnosis not present

## 2020-09-24 DIAGNOSIS — I129 Hypertensive chronic kidney disease with stage 1 through stage 4 chronic kidney disease, or unspecified chronic kidney disease: Secondary | ICD-10-CM | POA: Diagnosis not present

## 2020-09-24 DIAGNOSIS — E875 Hyperkalemia: Secondary | ICD-10-CM | POA: Diagnosis not present

## 2020-09-24 DIAGNOSIS — D631 Anemia in chronic kidney disease: Secondary | ICD-10-CM | POA: Diagnosis not present

## 2020-09-24 DIAGNOSIS — N184 Chronic kidney disease, stage 4 (severe): Secondary | ICD-10-CM | POA: Diagnosis not present

## 2020-09-24 DIAGNOSIS — R6889 Other general symptoms and signs: Secondary | ICD-10-CM | POA: Diagnosis not present

## 2020-09-24 DIAGNOSIS — N2581 Secondary hyperparathyroidism of renal origin: Secondary | ICD-10-CM | POA: Diagnosis not present

## 2020-09-25 ENCOUNTER — Other Ambulatory Visit: Payer: Self-pay | Admitting: Family Medicine

## 2020-09-25 DIAGNOSIS — D509 Iron deficiency anemia, unspecified: Secondary | ICD-10-CM

## 2020-09-25 DIAGNOSIS — F332 Major depressive disorder, recurrent severe without psychotic features: Secondary | ICD-10-CM | POA: Diagnosis not present

## 2020-09-25 NOTE — Telephone Encounter (Signed)
No future visit scheduled

## 2020-09-25 NOTE — Telephone Encounter (Signed)
Requested medication (s) are due for refill today: expired medication  Requested medication (s) are on the active medication list: yes  Last refill:  norvasc 09/13/19 #90 01 refills, vit D3 07/31/20 #30  0 refills   Future visit scheduled: no  Notes to clinic:  expired medication - Norvasc Do you want to renew Rx? And vit D3 not delegated per protocol     Requested Prescriptions  Pending Prescriptions Disp Refills   amLODipine (NORVASC) 10 MG tablet [Pharmacy Med Name: amLODIPine Besylate 10MG  TABS*] 30 tablet     Sig: TAKE 1 TABLET BY MOUTH DAILY      Cardiovascular:  Calcium Channel Blockers Failed - 09/25/2020  4:18 PM      Failed - Last BP in normal range    BP Readings from Last 1 Encounters:  08/26/20 (!) 184/84          Passed - Valid encounter within last 6 months    Recent Outpatient Visits           2 months ago Preoperative evaluation to rule out surgical contraindication   [facility], Ranell Patrick, MD   3 months ago CKD (chronic kidney disease) stage 4, GFR 15-29 ml/min ([place])   [facility], Ranell Patrick, MD   3 months ago Chest pain, unspecified type   [facility], Ranell Patrick, MD   4 months ago Abdominal pain, chronic, epigastric   [facility], Ranell Patrick, MD   5 months ago Anemia in stage 3 chronic kidney disease, unspecified whether stage 3a or 3b CKD   [facility], Fenton Malling, MD                  Cholecalciferol (VITAMIN D3) 50 MCG (2000 UT) capsule [Pharmacy Med Name: Vitamin D3 50 MCG(2000 UT) CAPS] 30 capsule 0    Sig: TAKE 1 CAPSULE BY MOUTH DAILY      Endocrinology:  Vitamins - Vitamin D Supplementation Failed - 09/25/2020  4:18 PM      Failed - 50,000 IU strengths are not delegated      Failed - Phosphate in normal range and within 360 days    Phosphorus  Date Value Ref Range Status  05/11/2019 4.8 (H) 2.5 - 4.6 mg/dL Final          Failed - Vitamin D in normal  range and within 360 days    Vit D, 25-Hydroxy  Date Value Ref Range Status  05/16/2019 14.0  Final          Passed - Ca in normal range and within 360 days    Calcium  Date Value Ref Range Status  08/21/2020 8.9 8.9 - 10.3 mg/dL Final   Calcium, Ion  Date Value Ref Range Status  08/21/2020 1.12 (L) 1.15 - 1.40 mmol/L Final          Passed - Valid encounter within last 12 months    Recent Outpatient Visits           2 months ago Preoperative evaluation to rule out surgical contraindication   [facility], Ranell Patrick, MD   3 months ago CKD (chronic kidney disease) stage 4, GFR 15-29 ml/min [facility])   [facility], Ranell Patrick, MD   3 months ago Chest pain, unspecified type   [facility], Ranell Patrick, MD   4 months ago Abdominal pain, chronic,  epigastric   [facility], Ranell Patrick, MD   5 months ago Anemia in stage 3 chronic kidney disease, unspecified whether stage 3a or 3b CKD   [facility] at [facility], Fenton Malling, MD                 Signed Prescriptions Disp Refills   FEROSUL 325 (65 Fe) MG tablet 30 tablet 0    Sig: TAKE 1 TABLET BY MOUTH DAILY WITH BREAKFAST      Endocrinology:  Minerals - Iron Supplementation Failed - 09/25/2020  4:18 PM      Failed - HGB in normal range and within 360 days    Hemoglobin  Date Value Ref Range Status  08/21/2020 10.2 (L) 12.0 - 15.0 g/dL Final  07/25/2020 10.2 (L) 11.1 - 15.9 g/dL Final          Failed - HCT in normal range and within 360 days    HCT  Date Value Ref Range Status  08/21/2020 30.0 (L) 36.0 - 46.0 % Final   Hematocrit  Date Value Ref Range Status  07/25/2020 30.8 (L) 34.0 - 46.6 % Final          Failed - RBC in normal range and within 360 days    RBC  Date Value Ref Range Status  08/15/2020 3.51 (L) 3.87 - 5.11 MIL/uL Final          Passed - Fe (serum) in normal range and within 360 days    Iron  Date Value Ref Range Status   05/29/2020 45 28 - 170 ug/dL Final  04/04/2020 59 27 - 139 ug/dL Final   Iron Saturation  Date Value Ref Range Status  04/04/2020 27 15 - 55 % Final   Saturation Ratios  Date Value Ref Range Status  05/29/2020 17 10.4 - 31.8 % Final          Passed - Ferritin in normal range and within 360 days    Ferritin  Date Value Ref Range Status  05/29/2020 188 11 - 307 ng/mL Final    Comment:    Performed at [facility], [place] [address] [place], [place] [address]  04/04/2020 408 (H) 15 - 150 ng/mL Final          Passed - Valid encounter within last 12 months    Recent Outpatient Visits           2 months ago Preoperative evaluation to rule out surgical contraindication   [facility], Ranell Patrick, MD   3 months ago CKD (chronic kidney disease) stage 4, GFR 15-29 ml/min [facility])   [facility], Ranell Patrick, MD   3 months ago Chest pain, unspecified type   [facility], Ranell Patrick, MD   4 months ago Abdominal pain, chronic, epigastric   [facility], Ranell Patrick, MD   5 months ago Anemia in stage 3 chronic kidney disease, unspecified whether stage 3a or 3b CKD   [facility] at [facility], Fenton Malling, MD                  isosorbide mononitrate (IMDUR) 60 MG 24 hr tablet 30 tablet 0    Sig: TAKE 1 TABLET BY MOUTH DAILY      Cardiovascular:  Nitrates Failed - 09/25/2020  4:18 PM      Failed - Last BP in normal range    BP Readings  from Last 1 Encounters:  08/26/20 (!) 184/84          Passed - Last Heart Rate in normal range    Pulse Readings from Last 1 Encounters:  08/26/20 72          Passed - Valid encounter within last 12 months    Recent Outpatient Visits           2 months ago Preoperative evaluation to rule out surgical contraindication   [facility], Ranell Patrick, MD   3 months ago CKD (chronic kidney disease) stage 4, GFR 15-29 ml/min [facility])   [facility],  Ranell Patrick, MD   3 months ago Chest pain, unspecified type   [facility], Ranell Patrick, MD   4 months ago Abdominal pain, chronic, epigastric   [facility], Ranell Patrick, MD   5 months ago Anemia in stage 3 chronic kidney disease, unspecified whether stage 3a or 3b CKD   [facility] at [facility], Fenton Malling, MD

## 2020-09-27 ENCOUNTER — Other Ambulatory Visit: Payer: Self-pay | Admitting: Neurosurgery

## 2020-10-05 ENCOUNTER — Emergency Department (HOSPITAL_BASED_OUTPATIENT_CLINIC_OR_DEPARTMENT_OTHER)
Admission: EM | Admit: 2020-10-05 | Discharge: 2020-10-05 | Disposition: A | Discharge status: Home / Self Care | Payer: Medicare HMO | Attending: Emergency Medicine | Admitting: Emergency Medicine

## 2020-10-05 ENCOUNTER — Other Ambulatory Visit: Payer: Self-pay

## 2020-10-05 ENCOUNTER — Encounter (HOSPITAL_BASED_OUTPATIENT_CLINIC_OR_DEPARTMENT_OTHER): Payer: Self-pay | Admitting: Emergency Medicine

## 2020-10-05 DIAGNOSIS — Z79899 Other long term (current) drug therapy: Secondary | ICD-10-CM | POA: Insufficient documentation

## 2020-10-05 DIAGNOSIS — M545 Low back pain, unspecified: Secondary | ICD-10-CM | POA: Diagnosis not present

## 2020-10-05 DIAGNOSIS — Z8673 Personal history of transient ischemic attack (TIA), and cerebral infarction without residual deficits: Secondary | ICD-10-CM | POA: Diagnosis not present

## 2020-10-05 DIAGNOSIS — N184 Chronic kidney disease, stage 4 (severe): Secondary | ICD-10-CM | POA: Diagnosis not present

## 2020-10-05 DIAGNOSIS — M5441 Lumbago with sciatica, right side: Secondary | ICD-10-CM | POA: Diagnosis not present

## 2020-10-05 DIAGNOSIS — F17210 Nicotine dependence, cigarettes, uncomplicated: Secondary | ICD-10-CM | POA: Insufficient documentation

## 2020-10-05 DIAGNOSIS — Z853 Personal history of malignant neoplasm of breast: Secondary | ICD-10-CM | POA: Diagnosis not present

## 2020-10-05 DIAGNOSIS — I5032 Chronic diastolic (congestive) heart failure: Secondary | ICD-10-CM | POA: Insufficient documentation

## 2020-10-05 DIAGNOSIS — T68XXXA Hypothermia, initial encounter: Secondary | ICD-10-CM | POA: Diagnosis not present

## 2020-10-05 DIAGNOSIS — M549 Dorsalgia, unspecified: Secondary | ICD-10-CM | POA: Diagnosis not present

## 2020-10-05 DIAGNOSIS — M5431 Sciatica, right side: Secondary | ICD-10-CM

## 2020-10-05 DIAGNOSIS — I130 Hypertensive heart and chronic kidney disease with heart failure and stage 1 through stage 4 chronic kidney disease, or unspecified chronic kidney disease: Secondary | ICD-10-CM | POA: Diagnosis not present

## 2020-10-05 MED ORDER — FENTANYL CITRATE (PF) 100 MCG/2ML IJ SOLN
50.0000 ug | Freq: Once | INTRAMUSCULAR | Status: AC
Start: 2020-10-05 — End: 2020-10-05
  Administered 2020-10-05: 50 ug via INTRAVENOUS
  Filled 2020-10-05: qty 2

## 2020-10-05 MED ORDER — ACETAMINOPHEN 325 MG PO TABS: 650.0000 mg | ORAL_TABLET | Freq: Once | ORAL | Status: DC

## 2020-10-05 MED ORDER — ONDANSETRON HCL 4 MG/2ML IJ SOLN
4.0000 mg | Freq: Once | INTRAMUSCULAR | Status: AC
  Administered 2020-10-05: 4 mg via INTRAVENOUS
  Filled 2020-10-05: qty 2

## 2020-10-05 MED ORDER — HYDROMORPHONE HCL 1 MG/ML IJ SOLN
1.0000 mg | Freq: Once | INTRAMUSCULAR | Status: AC
  Administered 2020-10-05: 1 mg via INTRAVENOUS
  Filled 2020-10-05 (×2): qty 1

## 2020-10-05 NOTE — ED Provider Notes (Signed)
[facility] EMERGENCY DEPARTMENT Provider Note   CSN: [id number] Arrival date & time: 10/05/20  1822     History Chief Complaint  Patient presents with  . Back Pain    Katelen Luepke is a 65 y.o. female.  Presents to ER with concern for back pain.  Patient reports that she has a long history of back pain, has suffered for her entire life.  Is followed by neurosurgery, has plans for surgery in April.  States that she recently had a really bad flare starting this morning.  Pain is sharp, stabbing, radiating down her right leg.  Similar location to prior.  No associated numbness, weakness, bladder or bowel incontinence.  Has been taking her home Vicodin without any improvement.  Per review of chart, ER visit in April for sciatica.  Dr. Arnoldo Morale planning surgery for April  HPI     Past Medical History:  Diagnosis Date  . Alcohol abuse 05/09/2019  . Alcohol use disorder, severe, dependence ([place]) 08/27/2017  . Alcohol withdrawal ([place]) 08/09/2017  . Anxiety   . ARF (acute renal failure) ([place]) 04/09/2020  . Bilateral low back pain with bilateral sciatica 01/05/2019  . Bipolar and related disorder ([place])   . Blood transfusion without reported diagnosis   . Breast cancer ([place])    right lumpectemy and lymph node   . Cancer ([place]) 09/06/2017  . Chest pain 05/31/2020  . Chest pain, rule out acute myocardial infarction 05/29/2020  . CHF (congestive heart failure) ([place])   . Chronic back pain   . Chronic diastolic CHF (congestive heart failure) ([place]) 05/09/2019  . Chronic kidney disease    "Stage IV" - states due to hypertension  . Chronic kidney disease, stage III (moderate) ([place]) 11/17/2016  . Chronic low back pain 09/06/2017   Disc levels:    No abnormality at L2-3 or above.    L3-4:Mild bulging of the disc.No stenosis.    L4-5: Bilateral facet arthropathy with gaping, fluid-filled joints.  Joint edema. Anterolisthesis would likely occur at this level with  standing and  flexion. Disc degeneration with mild bulging of the  disc. Mild narrowing of the lateral recesses and foramina, which  would likely worsen  . Cigarette smoker 12/31/2017  . CKD (chronic kidney disease) stage 4, GFR 15-29 ml/min (H[facility]) 04/18/2020  . Cyst of ovary   . Depression   . Depression with anxiety 08/21/2016  . Dizziness 02/24/2018  . DOE (dyspnea on exertion) 12/30/2017   12/30/2017   Walked RA x one lap @ 185 stopped due to  Sob/ lightheaded weak with sats 96% at end - Spirometry 12/30/2017  FEV1 1.27 (68%)  Ratio 99 s curvature / abn effort dep portion only    . Dyspnea   . Dyspnea and respiratory abnormality 02/13/2013   Formatting of this note might be different from the original. STORY: PSG on 05/20/12 showed no OSA but upper airway resistance syndrome, AHI 3.9, severe snoring was noted. ESS=8. UARS. Advise positional therapy, weight loss program, regular exercise.  . Essential hypertension 07/06/2017  . GERD (gastroesophageal reflux disease)   . Grief reaction with prolonged bereavement 09/06/2017   Partner of 18 yrs left 2018-pt admits she still loves her  . Hepatitis C    recieved treatment for Hep C  . Hepatitis C virus infection cured after antiviral drug therapy 12/17/2012   Telephone Encounter - Dub Mikes, RN - 12/28/2016 9:54 AM EDT Patient called for HCV RNA test results. EOT 08-26-16 - not detected. 12  week post treatment 12-11-16 - not detected. Informed patient that she was cured. Patient verbalized understanding.        Marland Kitchen History of breast cancer 02/13/2013  . Hx of bipolar disorder 01/31/2016  . Hypertension   . Hypertensive emergency 10/21/2017  . Left-sided weakness   . MDD (major depressive disorder), recurrent severe, without psychosis ([place])   . Medication intolerance 09/06/2017   Remeron-appetite stimulant/Dizziness/Dysphoria  . Microcytic anemia 10/21/2017  . Morbid obesity due to excess calories ([place]) 02/13/2013   BMI 42.9  . MRSA infection    of breast incision  .  Nausea 04/09/2020   Formatting of this note might be different from the original. Added automatically from request for surgery [id number]  . Near syncope 05/29/2020  . Neuroleptic-induced tardive dyskinesia 09/06/2017   Abilify  . Opioid dependence ([place])    Has been [facility] in [facility] in past  . Opioid use disorder, severe, dependence ([place]) 08/27/2017  . Oral aphthous ulcer 06/25/2015   Last Assessment & Plan:  Formatting of this note might be different from the original. - Appear to be resolving as a be expected in the case of aphthous stomatitis. - Continue topical therapy with viscous lidocaine.  Recommended trying high-dose anti-inflammatory such as 600 mg of ibuprofen as needed. - She'll return to ENT clinic if symptoms do not resolve.  . Phlebitis after infusion 10/28/2017   Rt antecubital fossa  . Pre-operative cardiovascular examination 06/13/2018  . Restless legs syndrome 02/13/2013   Formatting of this note might be different from the original. IMPRESSION: Possible. Will follow.  . Subacute dyskinesia due to drug 02/13/2013   Formatting of this note might be different from the original. STORY: Tardive dyskinesia and mild limb dyskinesia, LE>UE which likely due to prolonged antipsychotic used, abilify. Couldn't tolerate artane, gabapentin, clonazepam and xenazine.`E1o3L`IMPRESSION: Well tolerated depakote 250mg  bid, mood aspect is better as well. Will increase to 500mg  bid. ADR was discussed.  RTC 4-6 weeks.  . Substance abuse ([place])   . Substance induced mood disorder ([place]) 08/13/2017  . TIA (transient ischemic attack) 2020   P/w BP >230/120 and neurologic symptoms, presumed TIA    Patient Active Problem List   Diagnosis Date Noted  . Anxiety   . Bipolar and related disorder ([place])   . Blood transfusion without reported diagnosis   . Breast cancer ([place])   . CHF (congestive heart failure) ([place])   . Chronic back pain   . Chronic kidney disease   . Depression   . Dyspnea   . Hepatitis  C   . MRSA infection   . Opioid dependence ([place])   . Substance abuse ([place])   . Chest pain 05/31/2020  . Near syncope 05/29/2020  . Chest pain, rule out acute myocardial infarction 05/29/2020  . CKD (chronic kidney disease) stage 4, GFR 15-29 ml/min (HCC) 04/18/2020  . ARF (acute renal failure) ([place]) 04/09/2020  . Nausea 04/09/2020  . Chronic diastolic CHF (congestive heart failure) ([place]) 05/09/2019  . GERD (gastroesophageal reflux disease) 05/09/2019  . Alcohol abuse 05/09/2019  . Hypertension 05/08/2019  . Bilateral low back pain with bilateral sciatica 01/05/2019  . TIA (transient ischemic attack) 2020  . Cyst of ovary   . Pre-operative cardiovascular examination 06/13/2018  . Dizziness 02/24/2018  . Left-sided weakness 02/24/2018  . Cigarette smoker 12/31/2017  . DOE (dyspnea on exertion) 12/30/2017  . Phlebitis after infusion 10/28/2017  . Hypertensive emergency 10/21/2017  . Microcytic anemia 10/21/2017  . Medication intolerance 09/06/2017  .  Neuroleptic-induced tardive dyskinesia 09/06/2017  . Cancer ([place]) 09/06/2017  . Chronic low back pain 09/06/2017  . Grief reaction with prolonged bereavement 09/06/2017  . Opioid use disorder, severe, dependence ([place]) 08/27/2017  . Alcohol use disorder, severe, dependence ([place]) 08/27/2017  . Substance induced mood disorder ([place]) 08/13/2017  . MDD (major depressive disorder), recurrent severe, without psychosis ([place])   . Alcohol withdrawal ([place]) 08/09/2017  . Essential hypertension 07/06/2017  . Chronic kidney disease, stage III (moderate) ([place]) 11/17/2016  . Depression with anxiety 08/21/2016  . Hx of bipolar disorder 01/31/2016  . Oral aphthous ulcer 06/25/2015  . History of breast cancer 02/13/2013  . Morbid obesity due to excess calories ([place]) 02/13/2013  . Restless legs syndrome 02/13/2013  . Subacute dyskinesia due to drug 02/13/2013  . Dyspnea and respiratory abnormality 02/13/2013  . Hepatitis C virus infection cured  after antiviral drug therapy 12/17/2012    Past Surgical History:  Procedure Laterality Date  . BACK SURGERY  1990  . BREAST SURGERY Right 2010   "breast cancer survivor" - states partial mastectomy and nodes  . COLONOSCOPY     [facility]  . ESOPHAGOGASTRODUODENOSCOPY     [facility]  . ESOPHAGOGASTRODUODENOSCOPY  04/18/2020   [facility]  . LAPAROSCOPIC CHOLECYSTECTOMY  2002  . LAPAROSCOPIC INCISIONAL / UMBILICAL / VENTRAL HERNIA REPAIR  04/13/2018   with BARD 15x 16XW mesh (supraumbilical)  . LAPAROSCOPIC LYSIS OF ADHESIONS  07/12/2018   Procedure: LAPAROSCOPIC LYSIS OF ADHESIONS;  Surgeon: Isabel Caprice, MD;  Location: [facility];  Service: Gynecology;;  . MULTIPLE TOOTH EXTRACTIONS    . MYOMECTOMY     x 2 prior to hysterectomy  . ROBOTIC ASSISTED BILATERAL SALPINGO OOPHERECTOMY Right 07/12/2018   Procedure: XI ROBOTIC ASSISTED RIGHT SALPINGO OOPHORECTOMY;  Surgeon: Isabel Caprice, MD;  Location: [facility];  Service: Gynecology;  Laterality: Right;  . TOTAL ABDOMINAL HYSTERECTOMY     fibroids   . UPPER GASTROINTESTINAL ENDOSCOPY    . WISDOM TOOTH EXTRACTION       OB History    Gravida  2   Para  0   Term      Preterm      AB  0   Living        SAB      IAB      Ectopic      Multiple      Live Births              Family History  Problem Relation Age of Onset  . Heart attack Mother   . Breast cancer Mother 43  . Dementia Mother   . Cancer Father 72       died of bleeding from kidneys  . Colon cancer Neg Hx   . Esophageal cancer Neg Hx   . Stomach cancer Neg Hx   . Rectal cancer Neg Hx     Social History   Tobacco Use  . Smoking status: Current Every Day Smoker    Packs/day: 1.00    Types: Cigarettes  . Smokeless tobacco: Never Used  Vaping Use  . Vaping Use: Former  . Start date: 06/27/2013  . Quit date: 06/14/2017  . Devices: Apple Cinnamon-0 mg  Substance Use Topics  . Alcohol use: Not Currently    Comment: hx over  30 years  . Drug use: Not Currently    Comment: h/o IVD use    Home Medications Prior to Admission medications   Medication  Sig Start Date End Date Taking? Authorizing Provider  amLODipine (NORVASC) 10 MG tablet TAKE 1 TABLET BY MOUTH DAILY 09/26/20   Wendie Agreste, MD  Cholecalciferol (VITAMIN D3) 50 MCG (2000 UT) capsule Take 1 capsule (2,000 Units total) by mouth daily. 09/26/20   Wendie Agreste, MD  FEROSUL 325 (65 Fe) MG tablet TAKE 1 TABLET BY MOUTH DAILY WITH BREAKFAST 09/25/20   Wendie Agreste, MD  FLUoxetine (PROZAC) 40 MG capsule Take 40 mg by mouth daily.  11/09/18   [provider]  gabapentin (NEURONTIN) 800 MG tablet TAKE 1 TABLET(800 MG) BY MOUTH THREE TIMES DAILY 09/23/20   Wendie Agreste, MD  hydrALAZINE (APRESOLINE) 25 MG tablet Take 1 tablet (25 mg total) by mouth 3 (three) times daily. Patient taking differently: Take 100 mg by mouth 3 (three) times daily. 05/11/19   Danford, Suann Larry, MD  HYDROcodone-acetaminophen (NORCO/VICODIN) 5-325 MG tablet Take 1 tablet by mouth every 6 (six) hours as needed. 08/26/20   Rayna Sexton, PA-C  INGREZZA 80 MG CAPS Take 80 mg by mouth daily. 10/15/17   [provider]  isosorbide mononitrate (IMDUR) 60 MG 24 hr tablet TAKE 1 TABLET BY MOUTH DAILY 09/25/20   Wendie Agreste, MD  labetalol (NORMODYNE) 300 MG tablet Take 300 mg by mouth 2 (two) times daily.    [provider]  methocarbamol (ROBAXIN) 500 MG tablet Take 500-1,000 mg by mouth every 6 (six) hours as needed for spasms. 08/07/20   [provider]  ondansetron (ZOFRAN) 4 MG tablet TAKE 1 TABLET BY MOUTH EVERY 8 HOURS AS NEEDED FOR NAUSEA OR VOMITING Patient taking differently: Take 4 mg by mouth every 8 (eight) hours as needed for nausea or vomiting. 01/12/20   Wendie Agreste, MD  pantoprazole (PROTONIX) 20 MG tablet Take 1 tablet (20 mg total) by mouth 2 (two) times daily. 06/01/20   Aline August, MD    Allergies    Abilify  [aripiprazole], Remeron [mirtazapine], Trazodone and nefazodone, Flexeril [cyclobenzaprine], and Amoxicillin  Review of Systems   Review of Systems  Constitutional: Negative for chills and fever.  HENT: Negative for ear pain and sore throat.   Eyes: Negative for pain and visual disturbance.  Respiratory: Negative for cough and shortness of breath.   Cardiovascular: Negative for chest pain and palpitations.  Gastrointestinal: Negative for abdominal pain and vomiting.  Genitourinary: Negative for dysuria and hematuria.  Musculoskeletal: Positive for back pain. Negative for arthralgias.  Skin: Negative for color change and rash.  Neurological: Negative for seizures and syncope.  All other systems reviewed and are negative.   Physical Exam Updated Vital Signs BP (!) 158/81   Pulse 76   Temp 98.3 F (36.8 C) (Oral)   Resp 18   Ht 5\' 2"  (1.575 m)   Wt 103.4 kg   SpO2 96%   BMI 41.70 kg/m   Physical Exam Vitals and nursing note reviewed.  Constitutional:      General: She is not in acute distress.    Appearance: She is well-developed.  HENT:     Head: Normocephalic and atraumatic.  Eyes:     Conjunctiva/sclera: Conjunctivae normal.  Cardiovascular:     Rate and Rhythm: Normal rate and regular rhythm.     Heart sounds: No murmur heard.   Pulmonary:     Effort: Pulmonary effort is normal. No respiratory distress.     Breath sounds: Normal breath sounds.  Abdominal:     Palpations: Abdomen is soft.  Tenderness: There is no abdominal tenderness.  Musculoskeletal:        General: No deformity or signs of injury.     Cervical back: Neck supple.     Comments: Some tenderness over lumbar spine, worse in right paraspinal muscles  Skin:    General: Skin is warm and dry.  Neurological:     Mental Status: She is alert.     Comments: Normal strength and sensation in bilateral lower extremities     ED Results / Procedures / Treatments   Labs (all labs ordered are listed,  but only abnormal results are displayed) Labs Reviewed - No data to display  EKG None  Radiology No results found.  Procedures Procedures   Medications Ordered in ED Medications  acetaminophen (TYLENOL) tablet 650 mg (has no administration in time range)  HYDROmorphone (DILAUDID) injection 1 mg (1 mg Intravenous Given 10/05/20 1928)  ondansetron (ZOFRAN) injection 4 mg (4 mg Intravenous Given 10/05/20 1928)  fentaNYL (SUBLIMAZE) injection 50 mcg (50 mcg Intravenous Given 10/05/20 2026)    ED Course  I have reviewed the triage vital signs and the nursing notes.  Pertinent labs & imaging results that were available during my care of the patient were reviewed by me and considered in my medical decision making (see chart for details).    MDM Rules/Calculators/A&P                         65 year old lady presents to ER with concern for acute on chronic low back pain radiating down her right leg.  On exam she is well-appearing, normal neurologic exam.  No red flags per history.  Provided symptomatic control.  Significant improvement in pain, offered steroid taper to help as a temporary measure until she can have her surgery next month however patient declines.  Recommend she follow-up closely with her neurosurgeon.   After the discussed management above, the patient was determined to be safe for discharge.  The patient was in agreement with this plan and all questions regarding their care were answered.  ED return precautions were discussed and the patient will return to the ED with any significant worsening of condition.   Final Clinical Impression(s) / ED Diagnoses Final diagnoses:  Sciatica of right side    Rx / DC Orders ED Discharge Orders    None       Lucrezia Starch, MD 10/05/20 2238

## 2020-10-05 NOTE — ED Triage Notes (Signed)
Arrived via [facility] with lower back pain, hx of same. Scheduled for back surgery 10/16/20. BP 180/pal, HR 65, R18 O2 Sat 100RA

## 2020-10-05 NOTE — Discharge Instructions (Addendum)
Please call your neurosurgeon on Monday morning to be of close follow-up appointment regarding your pain.  If you develop numbness, weakness, bladder or bowel incontinence, uncontrollable pain or other new concerning symptom, return to ER for reassessment.

## 2020-10-05 NOTE — ED Triage Notes (Signed)
Reports low back pain since this am.  Hx of the same.  Reports pain radiates down the right hip. On vicodin at home.  Reports no relief with the vicodin.  Last dose this morning.

## 2020-10-08 DIAGNOSIS — I10 Essential (primary) hypertension: Secondary | ICD-10-CM | POA: Diagnosis not present

## 2020-10-08 DIAGNOSIS — Z6841 Body Mass Index (BMI) 40.0 and over, adult: Secondary | ICD-10-CM | POA: Diagnosis not present

## 2020-10-08 DIAGNOSIS — Z008 Encounter for other general examination: Secondary | ICD-10-CM | POA: Diagnosis not present

## 2020-10-08 DIAGNOSIS — M549 Dorsalgia, unspecified: Secondary | ICD-10-CM | POA: Diagnosis not present

## 2020-10-08 DIAGNOSIS — Z0000 Encounter for general adult medical examination without abnormal findings: Secondary | ICD-10-CM | POA: Diagnosis not present

## 2020-10-08 DIAGNOSIS — F17210 Nicotine dependence, cigarettes, uncomplicated: Secondary | ICD-10-CM | POA: Diagnosis not present

## 2020-10-08 DIAGNOSIS — G2401 Drug induced subacute dyskinesia: Secondary | ICD-10-CM | POA: Diagnosis not present

## 2020-10-09 ENCOUNTER — Other Ambulatory Visit: Payer: Self-pay | Admitting: Family Medicine

## 2020-10-10 ENCOUNTER — Other Ambulatory Visit: Payer: Self-pay | Admitting: Family Medicine

## 2020-10-10 ENCOUNTER — Emergency Department (HOSPITAL_BASED_OUTPATIENT_CLINIC_OR_DEPARTMENT_OTHER)
Admission: EM | Admit: 2020-10-10 | Discharge: 2020-10-10 | Disposition: A | Discharge status: Home / Self Care | Payer: Medicare HMO | Attending: Emergency Medicine | Admitting: Emergency Medicine

## 2020-10-10 ENCOUNTER — Encounter (HOSPITAL_BASED_OUTPATIENT_CLINIC_OR_DEPARTMENT_OTHER): Payer: Self-pay | Admitting: Emergency Medicine

## 2020-10-10 ENCOUNTER — Other Ambulatory Visit: Payer: Self-pay

## 2020-10-10 DIAGNOSIS — Z79899 Other long term (current) drug therapy: Secondary | ICD-10-CM | POA: Diagnosis not present

## 2020-10-10 DIAGNOSIS — M5441 Lumbago with sciatica, right side: Secondary | ICD-10-CM | POA: Diagnosis not present

## 2020-10-10 DIAGNOSIS — R030 Elevated blood-pressure reading, without diagnosis of hypertension: Secondary | ICD-10-CM

## 2020-10-10 DIAGNOSIS — F17210 Nicotine dependence, cigarettes, uncomplicated: Secondary | ICD-10-CM | POA: Diagnosis not present

## 2020-10-10 DIAGNOSIS — I5032 Chronic diastolic (congestive) heart failure: Secondary | ICD-10-CM | POA: Diagnosis not present

## 2020-10-10 DIAGNOSIS — M545 Low back pain, unspecified: Secondary | ICD-10-CM | POA: Diagnosis present

## 2020-10-10 DIAGNOSIS — N184 Chronic kidney disease, stage 4 (severe): Secondary | ICD-10-CM | POA: Diagnosis not present

## 2020-10-10 DIAGNOSIS — I130 Hypertensive heart and chronic kidney disease with heart failure and stage 1 through stage 4 chronic kidney disease, or unspecified chronic kidney disease: Secondary | ICD-10-CM | POA: Insufficient documentation

## 2020-10-10 DIAGNOSIS — M5440 Lumbago with sciatica, unspecified side: Secondary | ICD-10-CM | POA: Diagnosis not present

## 2020-10-10 DIAGNOSIS — Z9011 Acquired absence of right breast and nipple: Secondary | ICD-10-CM | POA: Diagnosis not present

## 2020-10-10 DIAGNOSIS — Z853 Personal history of malignant neoplasm of breast: Secondary | ICD-10-CM | POA: Insufficient documentation

## 2020-10-10 DIAGNOSIS — I10 Essential (primary) hypertension: Secondary | ICD-10-CM | POA: Diagnosis not present

## 2020-10-10 DIAGNOSIS — M549 Dorsalgia, unspecified: Secondary | ICD-10-CM | POA: Diagnosis not present

## 2020-10-10 MED ORDER — OXYCODONE-ACETAMINOPHEN 5-325 MG PO TABS
1.0000 | ORAL_TABLET | Freq: Once | ORAL | Status: AC
  Administered 2020-10-10: 1 via ORAL
  Filled 2020-10-10: qty 1

## 2020-10-10 MED ORDER — HYDROMORPHONE HCL 1 MG/ML IJ SOLN
1.0000 mg | Freq: Once | INTRAMUSCULAR | Status: AC
  Administered 2020-10-10: 1 mg via INTRAMUSCULAR
  Filled 2020-10-10: qty 1

## 2020-10-10 MED ORDER — LIDOCAINE 5 % EX PTCH: 1.0000 | MEDICATED_PATCH | CUTANEOUS | 0 refills | Status: DC

## 2020-10-10 MED ORDER — ONDANSETRON 4 MG PO TBDP
4.0000 mg | ORAL_TABLET | Freq: Once | ORAL | Status: AC
  Administered 2020-10-10: 4 mg via ORAL
  Filled 2020-10-10: qty 1

## 2020-10-10 NOTE — ED Provider Notes (Signed)
[name] EMERGENCY DEPARTMENT Provider Note   CSN: [id number] Arrival date & time: 10/10/20  1725     History Chief Complaint  Patient presents with  . Back Pain    Sherry [name] is a 65 y.o. female history of alcohol abuse, anxiety, CHF, CKD, chronic back pain, depression, hepatitis C, hypertension, morbid obesity, opioid use disorder, dyskinesia, substance abuse, TIA  Patient presents via EMS today for right-sided low back pain, she rates pain as moderate-severe intensity constant radiating down the outside of her right leg worsened with movement and palpation no alleviating factors.  She reports has been taking Vicodin for her symptoms without relief.  She reports that she is scheduled for back surgery with Dr. Arnoldo Morale in 10 days, she reports that she went to Dr. Arnoldo Morale office today but he was not there so she then called EMS to bring her to this facility for evaluation.  Denies fever/chills, fall/injury, chest pain, abdominal pain, dysuria/hematuria, saddle paresthesias, bowel/bladder incontinence, urinary retention or any additional concerns.  Patient reports her pain today is consistent with her sciatica and denies any abnormal features.  HPI     Past Medical History:  Diagnosis Date  . Alcohol abuse 05/09/2019  . Alcohol use disorder, severe, dependence ([place]) 08/27/2017  . Alcohol withdrawal ([place]) 08/09/2017  . Anxiety   . ARF (acute renal failure) ([place]) 04/09/2020  . Bilateral low back pain with bilateral sciatica 01/05/2019  . Bipolar and related disorder ([place])   . Blood transfusion without reported diagnosis   . Breast cancer ([place])    right lumpectemy and lymph node   . Cancer ([place]) 09/06/2017  . Chest pain 05/31/2020  . Chest pain, rule out acute myocardial infarction 05/29/2020  . CHF (congestive heart failure) ([place])   . Chronic back pain   . Chronic diastolic CHF (congestive heart failure) ([place]) 05/09/2019  . Chronic kidney disease    "Stage IV" -  states due to hypertension  . Chronic kidney disease, stage III (moderate) ([place]) 11/17/2016  . Chronic low back pain 09/06/2017   Disc levels:    No abnormality at L2-3 or above.    L3-4:Mild bulging of the disc.No stenosis.    L4-5: Bilateral facet arthropathy with gaping, fluid-filled joints.  Joint edema. Anterolisthesis would likely occur at this level with  standing and flexion. Disc degeneration with mild bulging of the  disc. Mild narrowing of the lateral recesses and foramina, which  would likely worsen  . Cigarette smoker 12/31/2017  . CKD (chronic kidney disease) stage 4, GFR 15-29 ml/min (HCC) 04/18/2020  . Cyst of ovary   . Depression   . Depression with anxiety 08/21/2016  . Dizziness 02/24/2018  . DOE (dyspnea on exertion) 12/30/2017   12/30/2017   Walked RA x one lap @ 185 stopped due to  Sob/ lightheaded weak with sats 96% at end - Spirometry 12/30/2017  FEV1 1.27 (68%)  Ratio 99 s curvature / abn effort dep portion only    . Dyspnea   . Dyspnea and respiratory abnormality 02/13/2013   Formatting of this note might be different from the original. STORY: PSG on 05/20/12 showed no OSA but upper airway resistance syndrome, AHI 3.9, severe snoring was noted. ESS=8. UARS. Advise positional therapy, weight loss program, regular exercise.  . Essential hypertension 07/06/2017  . GERD (gastroesophageal reflux disease)   . Grief reaction with prolonged bereavement 09/06/2017   Partner of 18 yrs left 2018-pt admits she still loves her  . Hepatitis C  recieved treatment for Hep C  . Hepatitis C virus infection cured after antiviral drug therapy 12/17/2012   Telephone Encounter - Dub Mikes, RN - 12/28/2016 9:54 AM EDT Patient called for HCV RNA test results. EOT 08-26-16 - not detected. 12 week post treatment 12-11-16 - not detected. Informed patient that she was cured. Patient verbalized understanding.        Marland Kitchen History of breast cancer 02/13/2013  . Hx of bipolar disorder 01/31/2016  .  Hypertension   . Hypertensive emergency 10/21/2017  . Left-sided weakness   . MDD (major depressive disorder), recurrent severe, without psychosis ([place])   . Medication intolerance 09/06/2017   Remeron-appetite stimulant/Dizziness/Dysphoria  . Microcytic anemia 10/21/2017  . Morbid obesity due to excess calories ([place]) 02/13/2013   BMI 42.9  . MRSA infection    of breast incision  . Nausea 04/09/2020   Formatting of this note might be different from the original. Added automatically from request for surgery [id number]  . Near syncope 05/29/2020  . Neuroleptic-induced tardive dyskinesia 09/06/2017   Abilify  . Opioid dependence ([place])    Has been treated in [facility] in past  . Opioid use disorder, severe, dependence ([place]) 08/27/2017  . Oral aphthous ulcer 06/25/2015   Last Assessment & Plan:  Formatting of this note might be different from the original. - Appear to be resolving as a be expected in the case of aphthous stomatitis. - Continue topical therapy with viscous lidocaine.  Recommended trying high-dose anti-inflammatory such as 600 mg of ibuprofen as needed. - She'll return to ENT clinic if symptoms do not resolve.  . Phlebitis after infusion 10/28/2017   Rt antecubital fossa  . Pre-operative cardiovascular examination 06/13/2018  . Restless legs syndrome 02/13/2013   Formatting of this note might be different from the original. IMPRESSION: Possible. Will follow.  . Subacute dyskinesia due to drug 02/13/2013   Formatting of this note might be different from the original. STORY: Tardive dyskinesia and mild limb dyskinesia, LE>UE which likely due to prolonged antipsychotic used, abilify. Couldn't tolerate artane, gabapentin, clonazepam and xenazine.`E1o3L`IMPRESSION: Well tolerated depakote 250mg  bid, mood aspect is better as well. Will increase to 500mg  bid. ADR was discussed.  RTC 4-6 weeks.  . Substance abuse ([place])   . Substance induced mood disorder ([place]) 08/13/2017  . TIA (transient ischemic  attack) 2020   P/w BP >230/120 and neurologic symptoms, presumed TIA    Patient Active Problem List   Diagnosis Date Noted  . Anxiety   . Bipolar and related disorder ([place])   . Blood transfusion without reported diagnosis   . Breast cancer ([place])   . CHF (congestive heart failure) ([place])   . Chronic back pain   . Chronic kidney disease   . Depression   . Dyspnea   . Hepatitis C   . MRSA infection   . Opioid dependence ([place])   . Substance abuse ([place])   . Chest pain 05/31/2020  . Near syncope 05/29/2020  . Chest pain, rule out acute myocardial infarction 05/29/2020  . CKD (chronic kidney disease) stage 4, GFR 15-29 ml/min (HCC) 04/18/2020  . ARF (acute renal failure) ([place]) 04/09/2020  . Nausea 04/09/2020  . Chronic diastolic CHF (congestive heart failure) ([place]) 05/09/2019  . GERD (gastroesophageal reflux disease) 05/09/2019  . Alcohol abuse 05/09/2019  . Hypertension 05/08/2019  . Bilateral low back pain with bilateral sciatica 01/05/2019  . TIA (transient ischemic attack) 2020  . Cyst of ovary   . Pre-operative cardiovascular examination 06/13/2018  .  Dizziness 02/24/2018  . Left-sided weakness 02/24/2018  . Cigarette smoker 12/31/2017  . DOE (dyspnea on exertion) 12/30/2017  . Phlebitis after infusion 10/28/2017  . Hypertensive emergency 10/21/2017  . Microcytic anemia 10/21/2017  . Medication intolerance 09/06/2017  . Neuroleptic-induced tardive dyskinesia 09/06/2017  . Cancer ([place]) 09/06/2017  . Chronic low back pain 09/06/2017  . Grief reaction with prolonged bereavement 09/06/2017  . Opioid use disorder, severe, dependence ([place]) 08/27/2017  . Alcohol use disorder, severe, dependence ([place]) 08/27/2017  . Substance induced mood disorder ([place]) 08/13/2017  . MDD (major depressive disorder), recurrent severe, without psychosis ([place])   . Alcohol withdrawal ([place]) 08/09/2017  . Essential hypertension 07/06/2017  . Chronic kidney disease, stage III (moderate) ([place])  11/17/2016  . Depression with anxiety 08/21/2016  . Hx of bipolar disorder 01/31/2016  . Oral aphthous ulcer 06/25/2015  . History of breast cancer 02/13/2013  . Morbid obesity due to excess calories ([place]) 02/13/2013  . Restless legs syndrome 02/13/2013  . Subacute dyskinesia due to drug 02/13/2013  . Dyspnea and respiratory abnormality 02/13/2013  . Hepatitis C virus infection cured after antiviral drug therapy 12/17/2012    Past Surgical History:  Procedure Laterality Date  . BACK SURGERY  1990  . BREAST SURGERY Right 2010   "breast cancer survivor" - states partial mastectomy and nodes  . COLONOSCOPY     [facility]  . ESOPHAGOGASTRODUODENOSCOPY     [facility]  . ESOPHAGOGASTRODUODENOSCOPY  04/18/2020   [facility]  . LAPAROSCOPIC CHOLECYSTECTOMY  2002  . LAPAROSCOPIC INCISIONAL / UMBILICAL / VENTRAL HERNIA REPAIR  04/13/2018   with BARD 15x 16SA mesh (supraumbilical)  . LAPAROSCOPIC LYSIS OF ADHESIONS  07/12/2018   Procedure: LAPAROSCOPIC LYSIS OF ADHESIONS;  Surgeon: Isabel Caprice, MD;  Location: [facility];  Service: Gynecology;;  . MULTIPLE TOOTH EXTRACTIONS    . MYOMECTOMY     x 2 prior to hysterectomy  . ROBOTIC ASSISTED BILATERAL SALPINGO OOPHERECTOMY Right 07/12/2018   Procedure: XI ROBOTIC ASSISTED RIGHT SALPINGO OOPHORECTOMY;  Surgeon: Isabel Caprice, MD;  Location: [facility];  Service: Gynecology;  Laterality: Right;  . TOTAL ABDOMINAL HYSTERECTOMY     fibroids   . UPPER GASTROINTESTINAL ENDOSCOPY    . WISDOM TOOTH EXTRACTION       OB History    Gravida  2   Para  0   Term      Preterm      AB  0   Living        SAB      IAB      Ectopic      Multiple      Live Births              Family History  Problem Relation Age of Onset  . Heart attack Mother   . Breast cancer Mother 103  . Dementia Mother   . Cancer Father 63       died of bleeding from kidneys  . Colon cancer Neg Hx   . Esophageal cancer Neg Hx   . Stomach  cancer Neg Hx   . Rectal cancer Neg Hx     Social History   Tobacco Use  . Smoking status: Current Every Day Smoker    Packs/day: 1.00    Types: Cigarettes  . Smokeless tobacco: Never Used  Vaping Use  . Vaping Use: Former  . Start date: 06/27/2013  . Quit date: 06/14/2017  . Devices: Apple Cinnamon-0 mg  Substance Use Topics  . Alcohol use: Not Currently    Comment: hx over 30 years  . Drug use: Not Currently    Comment: h/o IVD use    Home Medications Prior to Admission medications   Medication Sig Start Date End Date Taking? Authorizing Provider  lidocaine (LIDODERM) 5 % Place 1 patch onto the skin daily. Remove & Discard patch within 12 hours or as directed by MD 10/10/20  Yes Nuala Alpha A, PA-C  amLODipine (NORVASC) 10 MG tablet TAKE 1 TABLET BY MOUTH DAILY Patient taking differently: Take 10 mg by mouth in the morning. 09/26/20   Wendie Agreste, MD  Cholecalciferol (VITAMIN D3) 50 MCG (2000 UT) capsule Take 1 capsule (2,000 Units total) by mouth daily. Patient taking differently: Take 2,000 Units by mouth in the morning. 09/26/20   Wendie Agreste, MD  diphenhydramine-acetaminophen (TYLENOL PM) 25-500 MG TABS tablet Take 1 tablet by mouth at bedtime.    [provider]  FEROSUL 325 (65 Fe) MG tablet TAKE 1 TABLET BY MOUTH DAILY WITH BREAKFAST Patient taking differently: Take 325 mg by mouth daily with breakfast. 09/25/20   Wendie Agreste, MD  FLUoxetine (PROZAC) 40 MG capsule Take 40 mg by mouth in the morning. 11/09/18   [provider]  gabapentin (NEURONTIN) 800 MG tablet TAKE 1 TABLET(800 MG) BY MOUTH THREE TIMES DAILY Patient taking differently: Take 800 mg by mouth 3 (three) times daily. 09/23/20   Wendie Agreste, MD  hydrALAZINE (APRESOLINE) 100 MG tablet Take 100 mg by mouth 3 (three) times daily. 09/26/20   [provider]  HYDROcodone-acetaminophen (NORCO) 10-325 MG tablet Take 1 tablet by mouth every 6 (six) hours as needed  (pain). 10/06/20   [provider]  HYDROcodone-acetaminophen (NORCO/VICODIN) 5-325 MG tablet Take 1 tablet by mouth every 6 (six) hours as needed. Patient not taking: Reported on 10/09/2020 08/26/20   Rayna Sexton, PA-C  INGREZZA 80 MG CAPS Take 80 mg by mouth at bedtime. 10/15/17   [provider]  isosorbide mononitrate (IMDUR) 60 MG 24 hr tablet TAKE 1 TABLET BY MOUTH DAILY Patient taking differently: Take 60 mg by mouth in the morning. 09/25/20   Wendie Agreste, MD  labetalol (NORMODYNE) 300 MG tablet Take 300 mg by mouth 2 (two) times daily.    [provider]  melatonin 5 MG TABS Take 5 mg by mouth at bedtime.    [provider]  methocarbamol (ROBAXIN) 500 MG tablet Take 500-1,000 mg by mouth every 6 (six) hours as needed for spasms. 08/07/20   [provider]  ondansetron (ZOFRAN) 4 MG tablet TAKE 1 TABLET BY MOUTH EVERY 8 HOURS AS NEEDED FOR NAUSEA OR VOMITING Patient taking differently: Take 4 mg by mouth every 8 (eight) hours as needed for nausea or vomiting. 01/12/20   Wendie Agreste, MD  pantoprazole (PROTONIX) 20 MG tablet Take 1 tablet (20 mg total) by mouth 2 (two) times daily. 06/01/20   Aline August, MD  sodium bicarbonate 650 MG tablet Take 650 mg by mouth 2 (two) times daily. 09/04/20   [provider]    Allergies    Abilify [aripiprazole], Remeron [mirtazapine], Trazodone and nefazodone, Flexeril [cyclobenzaprine], and Amoxicillin  Review of Systems   Review of Systems  Constitutional: Negative.  Negative for chills and fever.  Respiratory: Negative.  Negative for shortness of breath.   Cardiovascular: Negative.  Negative for chest pain.  Gastrointestinal: Negative.  Negative for abdominal pain, diarrhea and vomiting.  Genitourinary: Negative.  Negative for dysuria and hematuria.  Musculoskeletal: Positive for back pain. Negative for neck pain.  Neurological: Negative.  Negative for weakness and numbness.     Physical Exam Updated Vital Signs BP (!) 171/63   Pulse 70   Temp 98.2 F (36.8 C) (Oral)   Resp 18   SpO2 100%   Physical Exam Constitutional:      General: She is not in acute distress.    Appearance: Normal appearance. She is well-developed. She is not ill-appearing or diaphoretic.  HENT:     Head: Normocephalic and atraumatic.  Eyes:     General: Vision grossly intact. Gaze aligned appropriately.     Pupils: Pupils are equal, round, and reactive to light.  Neck:     Trachea: Trachea and phonation normal.  Cardiovascular:     Rate and Rhythm: Normal rate and regular rhythm.     Pulses:          Dorsalis pedis pulses are 2+ on the right side and 2+ on the left side.  Pulmonary:     Effort: Pulmonary effort is normal. No respiratory distress.  Abdominal:     General: There is no distension.     Palpations: Abdomen is soft.     Tenderness: There is no abdominal tenderness. There is no guarding or rebound.  Musculoskeletal:        General: Normal range of motion.     Cervical back: Normal range of motion.     Comments: Right lumbar paraspinal muscular tenderness and right gluteal muscular tenderness without overlying skin changes.  No midline C/T/L spinal tenderness to palpation, no deformity, crepitus, or step-off noted. No sign of injury to the neck or back.  Feet:     Right foot:     Protective Sensation: 2 sites tested. 2 sites sensed.     Left foot:     Protective Sensation: 2 sites tested. 2 sites sensed.  Skin:    General: Skin is warm and dry.  Neurological:     Mental Status: She is alert.     GCS: GCS eye subscore is 4. GCS verbal subscore is 5. GCS motor subscore is 6.     Comments: Speech is clear and goal oriented, follows commands Major Cranial nerves without deficit, no facial droop Moves extremities without ataxia, coordination intact  Psychiatric:        Behavior: Behavior normal.     ED Results / Procedures / Treatments   Labs (all labs  ordered are listed, but only abnormal results are displayed) Labs Reviewed - No data to display  EKG None  Radiology No results found.  Procedures Procedures   Medications Ordered in ED Medications  HYDROmorphone (DILAUDID) injection 1 mg (1 mg Intramuscular Given 10/10/20 1753)  ondansetron (ZOFRAN-ODT) disintegrating tablet 4 mg (4 mg Oral Given 10/10/20 1753)  oxyCODONE-acetaminophen (PERCOCET/ROXICET) 5-325 MG per tablet 1 tablet (1 tablet Oral Given 10/10/20 1851)    ED Course  I have reviewed the triage vital signs and the nursing notes.  Pertinent labs & imaging results that were available during my care of the patient were reviewed by me and considered in my medical decision making (see chart for details).    MDM Rules/Calculators/A&P                         Additional history obtained from: 1. Nursing notes from this visit. 2. Review of electronic medical records.  Patient scheduled  for posterior lumbar interbody fusion, interbody prosthesis, instrumentation L4/L5 on April 11 with Dr. Arnoldo Morale. --------------------- Sherry [name] is a 65 y.o. female presenting with right-sided low back pain with right-sided sciatica she reports it is consistent with her normal pain denies any abnormal features today. Patient denies history of trauma, fever, IV drug use, night sweats, weight loss, cancer, saddle anesthesia, urinary rentention, bowel/bladder incontinence. No neurological deficits and normal neuro exam.  Suspect patient's known right-sided sciatica as cause of symptoms today, low suspicion for spinal epidural abscess, cauda equina, AAA, kidney stone disease or other emergent pathologies at this time.  No imaging or blood work indicated at this time.  Patient's pain was controlled in the ER, I reviewed patient's recent prescription she had 28 hydrocodone 10 mg filled 4 days ago, this prescription should last an additional 3 days.  Do not feel comfortable prescribing additional  narcotics to the patient at this time, additionally she is not a candidate for NSAIDs given her known CKD.  Will give patient Lidoderm patches and encouraged her to call Dr. Arnoldo Morale tomorrow morning to schedule a follow-up appointment.  Incidentally patient's blood pressure elevated in the ER today, she is asymptomatic regarding her high blood pressure.  Blood pressure improved following pain control, patient courage to have blood pressure rechecked by PCP next week and to take daily meds as prescribed.  Patient form signs/symptoms hypertensive urgency/emergency and to return to the ER if they occur.  At this time there does not appear to be any evidence of an acute emergency medical condition and the patient appears stable for discharge with appropriate outpatient follow up. Diagnosis was discussed with patient who verbalizes understanding of care plan and is agreeable to discharge. I have discussed return precautions with patient who verbalizes understanding. Patient encouraged to follow-up with their PCP and Dr. Arnoldo Morale. All questions answered.  Patient's case discussed with Dr. Roslynn Amble who agrees with plan to discharge with follow-up.   Note: Portions of this report may have been transcribed using voice recognition software. Every effort was made to ensure accuracy; however, inadvertent computerized transcription errors may still be present. Final Clinical Impression(s) / ED Diagnoses Final diagnoses:  Acute right-sided low back pain with right-sided sciatica  Elevated blood pressure reading    Rx / DC Orders ED Discharge Orders         Ordered    lidocaine (LIDODERM) 5 %  Every 24 hours        10/10/20 1847           Gari Crown 10/10/20 1902    Lucrezia Starch, MD 10/10/20 [phone]

## 2020-10-10 NOTE — ED Triage Notes (Signed)
Arrived via EMS with lower back pain. Eval in ED on 10/05/20 for same. Taking Vicodin without relief

## 2020-10-10 NOTE — Discharge Instructions (Signed)
At this time there does not appear to be the presence of an emergent medical condition, however there is always the potential for conditions to change. Please read and follow the below instructions.  Please return to the Emergency Department immediately for any new or worsening symptoms. Please be sure to follow up with your Primary Care Provider within one week regarding your visit today; please call their office to schedule an appointment even if you are feeling better for a follow-up visit. You may use the Lidoderm patch as prescribed to help with your symptoms.  Lidoderm may be expensive so you may speak with your pharmacist about finding over-the-counter medications that work similarly. Please call Dr. Arnoldo Morale tomorrow morning to schedule follow-up appointment and inform of your ED visit today. Your blood pressure was high in the emergency department.  Please have it rechecked by your primary care provider at your follow-up appointment next week.  Please take your blood pressure medication as prescribed by your primary care provider.  Go to the nearest Emergency Department immediately if: You have fever or chills You develop new bowel or bladder control problems. You have unusual weakness or numbness in your arms or legs. You develop nausea or vomiting. You develop abdominal pain. You feel faint. Get a very bad headache. Start to feel mixed up (confused). Feel weak or numb. Feel faint. Have very bad pain in your: Chest. Belly (abdomen). Throw up more than once. Have trouble breathing. You have burning when you pee or blood in your urine. You have any new/concerning or worsening of symptoms.   Please read the additional information packets attached to your discharge summary.  Do not take your medicine if  develop an itchy rash, swelling in your mouth or lips, or difficulty breathing; call 911 and seek immediate emergency medical attention if this occurs.  You may review your lab  tests and imaging results in their entirety on your MyChart account.  Please discuss all results of fully with your primary care provider and other specialist at your follow-up visit.  Note: Portions of this text may have been transcribed using voice recognition software. Every effort was made to ensure accuracy; however, inadvertent computerized transcription errors may still be present.

## 2020-10-17 ENCOUNTER — Encounter (HOSPITAL_COMMUNITY): Payer: Self-pay

## 2020-10-17 ENCOUNTER — Other Ambulatory Visit: Payer: Self-pay

## 2020-10-17 ENCOUNTER — Encounter (HOSPITAL_COMMUNITY)
Admit: 2020-10-17 | Discharge: 2020-10-17 | Disposition: A | Discharge status: Home / Self Care | Payer: Medicare HMO | Source: Ambulatory Visit | Attending: Neurosurgery | Admitting: Neurosurgery

## 2020-10-17 DIAGNOSIS — Z01812 Encounter for preprocedural laboratory examination: Secondary | ICD-10-CM | POA: Insufficient documentation

## 2020-10-17 DIAGNOSIS — Z8673 Personal history of transient ischemic attack (TIA), and cerebral infarction without residual deficits: Secondary | ICD-10-CM | POA: Diagnosis not present

## 2020-10-17 DIAGNOSIS — Z20822 Contact with and (suspected) exposure to covid-19: Secondary | ICD-10-CM | POA: Insufficient documentation

## 2020-10-17 DIAGNOSIS — R6889 Other general symptoms and signs: Secondary | ICD-10-CM | POA: Diagnosis not present

## 2020-10-17 LAB — COMPREHENSIVE METABOLIC PANEL
ALT: 9 U/L (ref 0–44)
AST: 14 U/L — ABNORMAL LOW (ref 15–41)
Albumin: 3.6 g/dL (ref 3.5–5.0)
Alkaline Phosphatase: 78 U/L (ref 38–126)
Anion gap: 8 (ref 5–15)
BUN: 41 mg/dL — ABNORMAL HIGH (ref 8–23)
CO2: 20 mmol/L — ABNORMAL LOW (ref 22–32)
Calcium: 9 mg/dL (ref 8.9–10.3)
Chloride: 111 mmol/L (ref 98–111)
Creatinine, Ser: 3.27 mg/dL — ABNORMAL HIGH (ref 0.44–1.00)
GFR, Estimated: 15 mL/min — ABNORMAL LOW (ref >60)
Glucose, Bld: 105 mg/dL — ABNORMAL HIGH (ref 70–99)
Potassium: 5.7 mmol/L — ABNORMAL HIGH (ref 3.5–5.1)
Sodium: 139 mmol/L (ref 135–145)
Total Bilirubin: 0.4 mg/dL (ref 0.3–1.2)
Total Protein: 6.9 g/dL (ref 6.5–8.1)

## 2020-10-17 LAB — CBC
HCT: 33.8 % — ABNORMAL LOW (ref 36.0–46.0)
Hemoglobin: 10 g/dL — ABNORMAL LOW (ref 12.0–15.0)
MCH: 28.2 pg (ref 26.0–34.0)
MCHC: 29.6 g/dL — ABNORMAL LOW (ref 30.0–36.0)
MCV: 95.5 fL (ref 80.0–100.0)
Platelets: 275 10*3/uL (ref 150–400)
RBC: 3.54 MIL/uL — ABNORMAL LOW (ref 3.87–5.11)
RDW: 15.7 % — ABNORMAL HIGH (ref 11.5–15.5)
WBC: 6.7 10*3/uL (ref 4.0–10.5)
nRBC: 0 % (ref 0.0–0.2)

## 2020-10-17 LAB — TYPE AND SCREEN
ABO/RH(D): O POS
Antibody Screen: NEGATIVE

## 2020-10-17 LAB — SURGICAL PCR SCREEN
MRSA, PCR: NEGATIVE
Staphylococcus aureus: NEGATIVE

## 2020-10-17 LAB — SARS CORONAVIRUS 2 (TAT 6-24 HRS): SARS Coronavirus 2: NEGATIVE

## 2020-10-17 NOTE — Progress Notes (Signed)
PCP: Dr. [name] Cardiologist: denies [facility]: Dr. Tylene Fantasia request for most recent office note and labs  EKG:05/29/20 CXR: 07/25/20 ECHO: 05/30/20 Stress Test: 08/06/20 Cardiac Cath: denies  Covid test today: aware to quarentine  Pt requesting to go to ED after PAT appt, RE: pain in back.  Rates it an 8/10.  States "surgery can't come soon enough."  Anesthesia made aware, no need to see during visit.  Patient taken to ED.  Patient denies shortness of breath, fever, cough, and chest pain at PAT appointment.  Patient verbalized understanding of instructions provided today at the PAT appointment.  Patient asked to review instructions at home and day of surgery.

## 2020-10-17 NOTE — Progress Notes (Signed)
Surgical Instructions    Your procedure is scheduled on October 21, 2020.  Report to [facility] Main Entrance "A" at 09:30 A.M., then check in with the Admitting office.  Call this number if you have problems the morning of surgery:  [phone]   If you have any questions prior to your surgery date call [phone]: Open Monday-Friday 8am-4pm   Remember:  Do not eat or drink after midnight the night before your surgery    Take these medicines the morning of surgery with A SIP OF WATER : amLODipine (NORVASC)  FLUoxetine (PROZAC) gabapentin (NEURONTIN) hydrALAZINE (APRESOLINE isosorbide mononitrate (IMDUR)  labetalol (NORMODYNE)  methocarbamol (ROBAXIN) if needed for muscle spasams pantoprazole (PROTONIX)  As of today, STOP taking any Aspirin (unless otherwise instructed by your surgeon) Aleve, Naproxen, Ibuprofen, Motrin, Advil, Goody's, BC's, all herbal medications, fish oil, and all vitamins.                     Do not wear jewelry, make up, or nail polish            Do not wear lotions, powders, perfumes, or deodorant.            Do not shave 48 hours prior to surgery.             Do not bring valuables to the hospital.            [facility] is not responsible for any belongings or valuables.  Do NOT Smoke (Tobacco/Vaping) or drink Alcohol 24 hours prior to your procedure If you use a CPAP at night, you may bring all equipment for your overnight stay.   Contacts, glasses, dentures or bridgework may not be worn into surgery, please bring cases for these belongings   For patients admitted to the hospital, discharge time will be determined by your treatment team.   Patients discharged the day of surgery will not be allowed to drive home, and someone needs to stay with them for 24 hours.    Special instructions:   [place]- Preparing For Surgery  Before surgery, you can play an important role. Because skin is not sterile, your skin needs to be as free of germs as  possible. You can reduce the number of germs on your skin by washing with CHG (chlorahexidine gluconate) Soap before surgery.  CHG is an antiseptic cleaner which kills germs and bonds with the skin to continue killing germs even after washing.    Oral Hygiene is also important to reduce your risk of infection.  Remember - BRUSH YOUR TEETH THE MORNING OF SURGERY WITH YOUR REGULAR TOOTHPASTE  Please do not use if you have an allergy to CHG or antibacterial soaps. If your skin becomes reddened/irritated stop using the CHG.  Do not shave (including legs and underarms) for at least 48 hours prior to first CHG shower. It is OK to shave your face.  Please follow these instructions carefully.   1. Shower the NIGHT BEFORE SURGERY and the MORNING OF SURGERY  2. If you chose to wash your hair, wash your hair first as usual with your normal shampoo.  3. After you shampoo, rinse your hair and body thoroughly to remove the shampoo.  4. Wash Face and genitals (private parts) with your normal soap.   5.  Shower the NIGHT BEFORE SURGERY and the MORNING OF SURGERY with CHG Soap.   6. Use CHG Soap as you would any other liquid soap. You can apply CHG  directly to the skin and wash gently with a scrungie or a clean washcloth.   7. Apply the CHG Soap to your body ONLY FROM THE NECK DOWN.  Do not use on open wounds or open sores. Avoid contact with your eyes, ears, mouth and genitals (private parts). Wash Face and genitals (private parts)  with your normal soap.   8. Wash thoroughly, paying special attention to the area where your surgery will be performed.  9. Thoroughly rinse your body with warm water from the neck down.  10. DO NOT shower/wash with your normal soap after using and rinsing off the CHG Soap.  11. Pat yourself dry with a CLEAN TOWEL.  12. Wear CLEAN PAJAMAS to bed the night before surgery  13. Place CLEAN SHEETS on your bed the night before your surgery  14. DO NOT SLEEP WITH  PETS.   Day of Surgery: Wear Clean/Comfortable clothing the morning of surgery Do not apply any deodorants/lotions.   Remember to brush your teeth WITH YOUR REGULAR TOOTHPASTE.   Please read over the following fact sheets that you were given.

## 2020-10-18 MED ORDER — VANCOMYCIN HCL 1500 MG/300ML IV SOLN
1500.0000 mg | INTRAVENOUS | Status: AC
  Administered 2020-10-21: 1500 mg via INTRAVENOUS
  Filled 2020-10-18 (×2): qty 300

## 2020-10-18 NOTE — Anesthesia Preprocedure Evaluation (Addendum)
Anesthesia Evaluation  Patient identified by MRN, date of birth, ID band Patient awake    Reviewed: Allergy & Precautions, H&P , NPO status , Patient's Chart, lab work & pertinent test results  Airway Mallampati: II  TM Distance: >3 FB Neck ROM: Full    Dental no notable dental hx. (+) Edentulous Upper, Edentulous Lower, Dental Advisory Given   Pulmonary Current Smoker and Patient abstained from smoking.,    Pulmonary exam normal breath sounds clear to auscultation       Cardiovascular hypertension, Pt. on medications and Pt. on home beta blockers +CHF   Rhythm:Regular Rate:Normal     Neuro/Psych Anxiety Depression TIA   GI/Hepatic negative GI ROS, Neg liver ROS, GERD  ,  Endo/Other  negative endocrine ROSMorbid obesity  Renal/GU Renal InsufficiencyRenal disease  negative genitourinary   Musculoskeletal   Abdominal   Peds  Hematology  (+) Blood dyscrasia, anemia ,   Anesthesia Other Findings   Reproductive/Obstetrics negative OB ROS                           Anesthesia Physical Anesthesia Plan  ASA: III  Anesthesia Plan: General   Post-op Pain Management:    Induction: Intravenous  PONV Risk Score and Plan: 3 and Ondansetron, Dexamethasone and Midazolam  Airway Management Planned: Oral ETT  Additional Equipment:   Intra-op Plan:   Post-operative Plan: Extubation in OR  Informed Consent: I have reviewed the patients History and Physical, chart, labs and discussed the procedure including the risks, benefits and alternatives for the proposed anesthesia with the patient or authorized representative who has indicated his/her understanding and acceptance.     Dental advisory given  Plan Discussed with: CRNA  Anesthesia Plan Comments: (PAT note by Karoline Caldwell, PA-C: History of HFpEF, uncontrolled hypertension, morbid obesity.  Seen by cardiologist Dr. Geraldo Pitter for preoperative  stratification.  He recommended preop Lexiscan as stated that if the test was negative she would not be a high risk for coronary events.  Nuclear stress done 08/06/2020 was nonischemic, low risk, EF 59%.  History of TIA in 2020 with hypertensive urgency.  Patient is a history of markedly uncontrolled hypertension with CKD 4 as result.  She is followed by nephrologist Dr. Carolin Sicks. Her creatinine and potassium have been quite variable recently, concern for medication and diet compliance. Her case was previously cancelled in February for hyperkalemia and AKI. She did subsequently follow up twice with Dr. Carolin Sicks. On 09/10/20 her potassium was 4.9 and creatinine 2.77. Dr. Carolin Sicks cleared her for surgery per note 09/24/20.  Preop labs 10/17/20 showed potassium 5.7 and creatinine 3.27. I called and spoke with Dr. Carolin Sicks. He recommended recheck labs on DOS. If potassium > 6.0 he advised pt may need admission for optimization prior to surgery. Recommended potassium 5.5 to 5.9 could potentially be treated by anesthesia if felt appropriate.  I discussed case with Dr. Ermalene Postin who agreed labs should be rechecked on DOS and risk/benefits of proceeding will ultimately be addressed by assigned anesthesiologist. I called and relayed all information to Dr. Arnoldo Morale' surgery scheduler.   History of hepatitis C status post treatment with antiviral medications.  History of alcohol abuse and polysubstance abuse, denies currently.  EKG 05/29/2020: Sinus rhythm.  Rate 67. Consider left ventricular hypertrophy No significant change since last tracing  CHEST - 2 VIEW 07/25/2020:  COMPARISON: 05/29/2020  FINDINGS: Stable mild cardiomegaly. No focal airspace consolidation, pleural effusion, or pneumothorax. Cervical ACDF.  IMPRESSION: No  active cardiopulmonary disease.  Nuclear stress 08/06/2020: There was no ST segment deviation noted during stress. Nuclear stress EF: 59%. The left ventricular ejection  fraction is normal (55-65%). The study is normal. This is a low risk study.  TTE 05/30/2020: 1. Left ventricular ejection fraction, by estimation, is 60 to 65%. The  left ventricle has normal function. The left ventricle has no regional  wall motion abnormalities. There is mild left ventricular hypertrophy.  Left ventricular diastolic parameters  are consistent with Grade II diastolic dysfunction (pseudonormalization).  Elevated left atrial pressure.  2. Right ventricular systolic function is normal. The right ventricular  size is normal. Tricuspid regurgitation signal is inadequate for assessing  PA pressure.  3. Left atrial size was mildly dilated.  4. The mitral valve is abnormal. No evidence of mitral valve  regurgitation. Moderate mitral annular calcification.  5. The aortic valve is tricuspid. Aortic valve regurgitation is not  visualized. No aortic stenosis is present.  6. The inferior vena cava is normal in size with greater than 50%  respiratory variability, suggesting right atrial pressure of 3 mmHg.   Carotid ultrasound 05/09/2019: Summary:  Right Carotid: Velocities in the right ICA are consistent with a 1-39%  stenosis.   Left Carotid: Velocities in the left ICA are consistent with a 1-39% stenosis.   Vertebrals: Bilateral vertebral arteries demonstrate antegrade flow.  Subclavians: Normal flow hemodynamics were seen in bilateral subclavian arteries.   )       Anesthesia Quick Evaluation

## 2020-10-18 NOTE — Progress Notes (Signed)
Anesthesia Chart Review:  History of HFpEF, uncontrolled hypertension, morbid obesity.  Seen by cardiologist Dr. Geraldo Pitter for preoperative stratification.  He recommended preop Lexiscan as stated that if the test was negative she would not be a high risk for coronary events.  Nuclear stress done 08/06/2020 was nonischemic, low risk, EF 59%.  History of TIA in 2020 with hypertensive urgency.  Patient is a history of markedly uncontrolled hypertension with CKD 4 as result.  She is followed by nephrologist Dr. Carolin Sicks. Her creatinine and potassium have been quite variable recently, concern for medication and diet compliance. Her case was previously cancelled in February for hyperkalemia and AKI. She did subsequently follow up twice with Dr. Carolin Sicks. On 09/10/20 her potassium was 4.9 and creatinine 2.77. Dr. Carolin Sicks cleared her for surgery per note 09/24/20.  Preop labs 10/17/20 showed potassium 5.7 and creatinine 3.27. I called and spoke with Dr. Carolin Sicks. He recommended recheck labs on DOS. If potassium > 6.0 he advised pt may need admission for optimization prior to surgery. Recommended potassium 5.5 to 5.9 could potentially be treated by anesthesia if felt appropriate.  I discussed case with Dr. Ermalene Postin who agreed labs should be rechecked on DOS and risk/benefits of proceeding will ultimately be addressed by assigned anesthesiologist. I called and relayed all information to Dr. Arnoldo Morale' surgery scheduler.   History of hepatitis C status post treatment with antiviral medications.  History of alcohol abuse and polysubstance abuse, denies currently.  EKG 05/29/2020: Sinus rhythm.  Rate 67. Consider left ventricular hypertrophy No significant change since last tracing  CHEST - 2 VIEW 07/25/2020:  COMPARISON: 05/29/2020  FINDINGS: Stable mild cardiomegaly. No focal airspace consolidation, pleural effusion, or pneumothorax. Cervical ACDF.  IMPRESSION: No active cardiopulmonary  disease.  Nuclear stress 08/06/2020:  There was no ST segment deviation noted during stress.  Nuclear stress EF: 59%.  The left ventricular ejection fraction is normal (55-65%).  The study is normal.  This is a low risk study.  TTE 05/30/2020: 1. Left ventricular ejection fraction, by estimation, is 60 to 65%. The  left ventricle has normal function. The left ventricle has no regional  wall motion abnormalities. There is mild left ventricular hypertrophy.  Left ventricular diastolic parameters  are consistent with Grade II diastolic dysfunction (pseudonormalization).  Elevated left atrial pressure.  2. Right ventricular systolic function is normal. The right ventricular  size is normal. Tricuspid regurgitation signal is inadequate for assessing  PA pressure.  3. Left atrial size was mildly dilated.  4. The mitral valve is abnormal. No evidence of mitral valve  regurgitation. Moderate mitral annular calcification.  5. The aortic valve is tricuspid. Aortic valve regurgitation is not  visualized. No aortic stenosis is present.  6. The inferior vena cava is normal in size with greater than 50%  respiratory variability, suggesting right atrial pressure of 3 mmHg.   Carotid ultrasound 05/09/2019: Summary:  Right Carotid: Velocities in the right ICA are consistent with a 1-39%  stenosis.   Left Carotid: Velocities in the left ICA are consistent with a 1-39% stenosis.   Vertebrals: Bilateral vertebral arteries demonstrate antegrade flow.  Subclavians: Normal flow hemodynamics were seen in bilateral subclavian arteries.    Wynonia Musty [facility] Short Stay Center/Anesthesiology Phone [phone] 10/18/2020 10:57 AM

## 2020-10-20 ENCOUNTER — Other Ambulatory Visit: Payer: Self-pay | Admitting: Family Medicine

## 2020-10-21 ENCOUNTER — Ambulatory Visit (HOSPITAL_COMMUNITY): Payer: Medicare HMO | Admitting: Physician Assistant

## 2020-10-21 ENCOUNTER — Ambulatory Visit (HOSPITAL_COMMUNITY): Payer: Medicare HMO

## 2020-10-21 ENCOUNTER — Encounter (HOSPITAL_COMMUNITY)
Admission: AD | Disposition: A | Discharge status: Skilled Nursing Facility | Payer: Self-pay | Source: Home / Self Care | Attending: Neurosurgery

## 2020-10-21 ENCOUNTER — Other Ambulatory Visit: Payer: Self-pay

## 2020-10-21 ENCOUNTER — Inpatient Hospital Stay (HOSPITAL_COMMUNITY)
Admission: AD | Admit: 2020-10-21 | Discharge: 2020-10-23 | DRG: 454 | Disposition: A | Discharge status: Skilled Nursing Facility | Payer: Medicare HMO | Attending: Neurosurgery | Admitting: Neurosurgery

## 2020-10-21 ENCOUNTER — Encounter (HOSPITAL_COMMUNITY): Payer: Self-pay | Admitting: Neurosurgery

## 2020-10-21 DIAGNOSIS — Z90710 Acquired absence of both cervix and uterus: Secondary | ICD-10-CM | POA: Diagnosis not present

## 2020-10-21 DIAGNOSIS — M5116 Intervertebral disc disorders with radiculopathy, lumbar region: Secondary | ICD-10-CM | POA: Diagnosis not present

## 2020-10-21 DIAGNOSIS — Z8673 Personal history of transient ischemic attack (TIA), and cerebral infarction without residual deficits: Secondary | ICD-10-CM | POA: Diagnosis not present

## 2020-10-21 DIAGNOSIS — F319 Bipolar disorder, unspecified: Secondary | ICD-10-CM | POA: Diagnosis present

## 2020-10-21 DIAGNOSIS — I130 Hypertensive heart and chronic kidney disease with heart failure and stage 1 through stage 4 chronic kidney disease, or unspecified chronic kidney disease: Secondary | ICD-10-CM | POA: Diagnosis not present

## 2020-10-21 DIAGNOSIS — Z853 Personal history of malignant neoplasm of breast: Secondary | ICD-10-CM

## 2020-10-21 DIAGNOSIS — G8929 Other chronic pain: Secondary | ICD-10-CM | POA: Diagnosis present

## 2020-10-21 DIAGNOSIS — N184 Chronic kidney disease, stage 4 (severe): Secondary | ICD-10-CM | POA: Diagnosis not present

## 2020-10-21 DIAGNOSIS — K219 Gastro-esophageal reflux disease without esophagitis: Secondary | ICD-10-CM | POA: Diagnosis present

## 2020-10-21 DIAGNOSIS — Z880 Allergy status to penicillin: Secondary | ICD-10-CM

## 2020-10-21 DIAGNOSIS — M62838 Other muscle spasm: Secondary | ICD-10-CM | POA: Diagnosis not present

## 2020-10-21 DIAGNOSIS — M48062 Spinal stenosis, lumbar region with neurogenic claudication: Principal | ICD-10-CM | POA: Diagnosis present

## 2020-10-21 DIAGNOSIS — Z885 Allergy status to narcotic agent status: Secondary | ICD-10-CM

## 2020-10-21 DIAGNOSIS — M6281 Muscle weakness (generalized): Secondary | ICD-10-CM | POA: Diagnosis not present

## 2020-10-21 DIAGNOSIS — F419 Anxiety disorder, unspecified: Secondary | ICD-10-CM | POA: Diagnosis not present

## 2020-10-21 DIAGNOSIS — M549 Dorsalgia, unspecified: Secondary | ICD-10-CM | POA: Diagnosis not present

## 2020-10-21 DIAGNOSIS — M4310 Spondylolisthesis, site unspecified: Secondary | ICD-10-CM | POA: Diagnosis present

## 2020-10-21 DIAGNOSIS — Z8672 Personal history of thrombophlebitis: Secondary | ICD-10-CM

## 2020-10-21 DIAGNOSIS — Z79890 Hormone replacement therapy (postmenopausal): Secondary | ICD-10-CM | POA: Diagnosis not present

## 2020-10-21 DIAGNOSIS — Z6841 Body Mass Index (BMI) 40.0 and over, adult: Secondary | ICD-10-CM

## 2020-10-21 DIAGNOSIS — Z419 Encounter for procedure for purposes other than remedying health state, unspecified: Secondary | ICD-10-CM

## 2020-10-21 DIAGNOSIS — Z20822 Contact with and (suspected) exposure to covid-19: Secondary | ICD-10-CM | POA: Diagnosis present

## 2020-10-21 DIAGNOSIS — Z79899 Other long term (current) drug therapy: Secondary | ICD-10-CM

## 2020-10-21 DIAGNOSIS — I499 Cardiac arrhythmia, unspecified: Secondary | ICD-10-CM | POA: Diagnosis not present

## 2020-10-21 DIAGNOSIS — M4326 Fusion of spine, lumbar region: Secondary | ICD-10-CM | POA: Diagnosis not present

## 2020-10-21 DIAGNOSIS — F332 Major depressive disorder, recurrent severe without psychotic features: Secondary | ICD-10-CM | POA: Diagnosis not present

## 2020-10-21 DIAGNOSIS — Z888 Allergy status to other drugs, medicaments and biological substances status: Secondary | ICD-10-CM

## 2020-10-21 DIAGNOSIS — R41841 Cognitive communication deficit: Secondary | ICD-10-CM | POA: Diagnosis not present

## 2020-10-21 DIAGNOSIS — M4316 Spondylolisthesis, lumbar region: Secondary | ICD-10-CM | POA: Diagnosis present

## 2020-10-21 DIAGNOSIS — I5032 Chronic diastolic (congestive) heart failure: Secondary | ICD-10-CM | POA: Diagnosis present

## 2020-10-21 DIAGNOSIS — F17210 Nicotine dependence, cigarettes, uncomplicated: Secondary | ICD-10-CM | POA: Diagnosis present

## 2020-10-21 DIAGNOSIS — D649 Anemia, unspecified: Secondary | ICD-10-CM | POA: Diagnosis present

## 2020-10-21 DIAGNOSIS — Z8614 Personal history of Methicillin resistant Staphylococcus aureus infection: Secondary | ICD-10-CM | POA: Diagnosis not present

## 2020-10-21 DIAGNOSIS — F418 Other specified anxiety disorders: Secondary | ICD-10-CM | POA: Diagnosis not present

## 2020-10-21 DIAGNOSIS — Z7401 Bed confinement status: Secondary | ICD-10-CM | POA: Diagnosis not present

## 2020-10-21 DIAGNOSIS — Z981 Arthrodesis status: Secondary | ICD-10-CM | POA: Diagnosis not present

## 2020-10-21 DIAGNOSIS — M2550 Pain in unspecified joint: Secondary | ICD-10-CM | POA: Diagnosis not present

## 2020-10-21 HISTORY — DX: Spondylolisthesis, lumbar region: M43.16

## 2020-10-21 LAB — BASIC METABOLIC PANEL
Anion gap: 11 (ref 5–15)
BUN: 39 mg/dL — ABNORMAL HIGH (ref 8–23)
CO2: 20 mmol/L — ABNORMAL LOW (ref 22–32)
Calcium: 9.1 mg/dL (ref 8.9–10.3)
Chloride: 107 mmol/L (ref 98–111)
Creatinine, Ser: 3.58 mg/dL — ABNORMAL HIGH (ref 0.44–1.00)
GFR, Estimated: 14 mL/min — ABNORMAL LOW (ref >60)
Glucose, Bld: 108 mg/dL — ABNORMAL HIGH (ref 70–99)
Potassium: 4.6 mmol/L (ref 3.5–5.1)
Sodium: 138 mmol/L (ref 135–145)

## 2020-10-21 SURGERY — POSTERIOR LUMBAR FUSION 1 LEVEL
Anesthesia: General | Site: Spine Lumbar

## 2020-10-21 MED ORDER — BACITRACIN ZINC 500 UNIT/GM EX OINT
TOPICAL_OINTMENT | CUTANEOUS | Status: AC
  Filled 2020-10-21: qty 28.35

## 2020-10-21 MED ORDER — ACETAMINOPHEN 500 MG PO TABS
1000.0000 mg | ORAL_TABLET | Freq: Four times a day (QID) | ORAL | Status: AC
  Administered 2020-10-21 – 2020-10-22 (×4): 1000 mg via ORAL
  Filled 2020-10-21 (×4): qty 2

## 2020-10-21 MED ORDER — ONDANSETRON HCL 4 MG/2ML IJ SOLN
INTRAMUSCULAR | Status: DC | PRN
  Administered 2020-10-21: 4 mg via INTRAVENOUS

## 2020-10-21 MED ORDER — PROPOFOL 10 MG/ML IV BOLUS
INTRAVENOUS | Status: DC | PRN
  Administered 2020-10-21: 140 mg via INTRAVENOUS

## 2020-10-21 MED ORDER — ROCURONIUM BROMIDE 10 MG/ML (PF) SYRINGE
PREFILLED_SYRINGE | INTRAVENOUS | Status: AC
  Filled 2020-10-21: qty 20

## 2020-10-21 MED ORDER — ONDANSETRON HCL 4 MG PO TABS: 4.0000 mg | ORAL_TABLET | Freq: Four times a day (QID) | ORAL | Status: DC | PRN

## 2020-10-21 MED ORDER — FLUOXETINE HCL 20 MG PO CAPS
40.0000 mg | ORAL_CAPSULE | Freq: Every morning | ORAL | Status: DC
  Administered 2020-10-22 – 2020-10-23 (×2): 40 mg via ORAL
  Filled 2020-10-21 (×2): qty 2

## 2020-10-21 MED ORDER — PHENYLEPHRINE 40 MCG/ML (10ML) SYRINGE FOR IV PUSH (FOR BLOOD PRESSURE SUPPORT)
PREFILLED_SYRINGE | INTRAVENOUS | Status: AC
  Filled 2020-10-21: qty 20

## 2020-10-21 MED ORDER — DOCUSATE SODIUM 100 MG PO CAPS
100.0000 mg | ORAL_CAPSULE | Freq: Two times a day (BID) | ORAL | Status: DC
  Administered 2020-10-21 – 2020-10-23 (×4): 100 mg via ORAL
  Filled 2020-10-21 (×4): qty 1

## 2020-10-21 MED ORDER — BUPIVACAINE LIPOSOME 1.3 % IJ SUSP
INTRAMUSCULAR | Status: DC | PRN
  Administered 2020-10-21: 20 mL

## 2020-10-21 MED ORDER — SUGAMMADEX SODIUM 200 MG/2ML IV SOLN
INTRAVENOUS | Status: DC | PRN
  Administered 2020-10-21: 200 mg via INTRAVENOUS

## 2020-10-21 MED ORDER — LACTATED RINGERS IV SOLN: INTRAVENOUS | Status: DC

## 2020-10-21 MED ORDER — SODIUM CHLORIDE 0.9 % IV SOLN: INTRAVENOUS | Status: DC

## 2020-10-21 MED ORDER — ROCURONIUM BROMIDE 10 MG/ML (PF) SYRINGE
PREFILLED_SYRINGE | INTRAVENOUS | Status: DC | PRN
  Administered 2020-10-21: 20 mg via INTRAVENOUS
  Administered 2020-10-21: 70 mg via INTRAVENOUS
  Administered 2020-10-21: 30 mg via INTRAVENOUS
  Administered 2020-10-21: 10 mg via INTRAVENOUS

## 2020-10-21 MED ORDER — ACETAMINOPHEN 325 MG PO TABS
650.0000 mg | ORAL_TABLET | ORAL | Status: DC | PRN
  Administered 2020-10-23: 650 mg via ORAL
  Filled 2020-10-21: qty 2

## 2020-10-21 MED ORDER — HYDROMORPHONE HCL 1 MG/ML IJ SOLN
INTRAMUSCULAR | Status: AC
  Administered 2020-10-21: 0.5 mg via INTRAVENOUS
  Filled 2020-10-21: qty 1

## 2020-10-21 MED ORDER — PHENYLEPHRINE 40 MCG/ML (10ML) SYRINGE FOR IV PUSH (FOR BLOOD PRESSURE SUPPORT)
PREFILLED_SYRINGE | INTRAVENOUS | Status: DC | PRN
Start: 2020-10-21 — End: 2020-10-21
  Administered 2020-10-21: 120 ug via INTRAVENOUS

## 2020-10-21 MED ORDER — MORPHINE SULFATE (PF) 4 MG/ML IV SOLN
4.0000 mg | INTRAVENOUS | Status: DC | PRN
  Administered 2020-10-21 – 2020-10-22 (×3): 4 mg via INTRAVENOUS
  Filled 2020-10-21 (×3): qty 1

## 2020-10-21 MED ORDER — METHOCARBAMOL 500 MG PO TABS
ORAL_TABLET | ORAL | Status: AC
  Administered 2020-10-21: 1000 mg
  Filled 2020-10-21: qty 2

## 2020-10-21 MED ORDER — ORAL CARE MOUTH RINSE: 15.0000 mL | Freq: Once | OROMUCOSAL | Status: AC

## 2020-10-21 MED ORDER — 0.9 % SODIUM CHLORIDE (POUR BTL) OPTIME
TOPICAL | Status: DC | PRN
  Administered 2020-10-21: 1000 mL

## 2020-10-21 MED ORDER — HYDRALAZINE HCL 50 MG PO TABS
100.0000 mg | ORAL_TABLET | Freq: Three times a day (TID) | ORAL | Status: DC
  Administered 2020-10-21 – 2020-10-22 (×4): 100 mg via ORAL
  Filled 2020-10-21 (×7): qty 2

## 2020-10-21 MED ORDER — PHENOL 1.4 % MT LIQD: 1.0000 | OROMUCOSAL | Status: DC | PRN

## 2020-10-21 MED ORDER — ACETAMINOPHEN 650 MG RE SUPP: 650.0000 mg | RECTAL | Status: DC | PRN

## 2020-10-21 MED ORDER — METHOCARBAMOL 500 MG PO TABS
500.0000 mg | ORAL_TABLET | Freq: Four times a day (QID) | ORAL | Status: DC | PRN
  Administered 2020-10-22 (×3): 500 mg via ORAL
  Filled 2020-10-21 (×2): qty 1

## 2020-10-21 MED ORDER — SODIUM CHLORIDE 0.9% FLUSH: 3.0000 mL | INTRAVENOUS | Status: DC | PRN

## 2020-10-21 MED ORDER — FENTANYL CITRATE (PF) 250 MCG/5ML IJ SOLN
INTRAMUSCULAR | Status: AC
  Filled 2020-10-21: qty 5

## 2020-10-21 MED ORDER — THROMBIN 5000 UNITS EX SOLR
CUTANEOUS | Status: AC
  Filled 2020-10-21: qty 5000

## 2020-10-21 MED ORDER — CHLORHEXIDINE GLUCONATE CLOTH 2 % EX PADS: 6.0000 | MEDICATED_PAD | Freq: Once | CUTANEOUS | Status: DC

## 2020-10-21 MED ORDER — DEXAMETHASONE SODIUM PHOSPHATE 10 MG/ML IJ SOLN
INTRAMUSCULAR | Status: DC | PRN
  Administered 2020-10-21: 10 mg via INTRAVENOUS

## 2020-10-21 MED ORDER — FENTANYL CITRATE (PF) 100 MCG/2ML IJ SOLN
INTRAMUSCULAR | Status: DC | PRN
  Administered 2020-10-21: 200 ug via INTRAVENOUS
  Administered 2020-10-21 (×4): 50 ug via INTRAVENOUS

## 2020-10-21 MED ORDER — BUPIVACAINE-EPINEPHRINE 0.5% -1:200000 IJ SOLN
INTRAMUSCULAR | Status: AC
  Filled 2020-10-21: qty 1

## 2020-10-21 MED ORDER — EPHEDRINE 5 MG/ML INJ
INTRAVENOUS | Status: AC
  Filled 2020-10-21: qty 30

## 2020-10-21 MED ORDER — SODIUM CHLORIDE 0.9% FLUSH
3.0000 mL | Freq: Two times a day (BID) | INTRAVENOUS | Status: DC
  Administered 2020-10-22 (×2): 3 mL via INTRAVENOUS

## 2020-10-21 MED ORDER — FERROUS SULFATE 325 (65 FE) MG PO TABS
325.0000 mg | ORAL_TABLET | Freq: Every day | ORAL | Status: DC
  Administered 2020-10-22 – 2020-10-23 (×2): 325 mg via ORAL
  Filled 2020-10-21 (×2): qty 1

## 2020-10-21 MED ORDER — SODIUM CHLORIDE 0.9 % IV SOLN: 250.0000 mL | INTRAVENOUS | Status: DC

## 2020-10-21 MED ORDER — ONDANSETRON HCL 4 MG/2ML IJ SOLN: 4.0000 mg | Freq: Four times a day (QID) | INTRAMUSCULAR | Status: DC | PRN

## 2020-10-21 MED ORDER — ZOLPIDEM TARTRATE 5 MG PO TABS: 5.0000 mg | ORAL_TABLET | Freq: Every evening | ORAL | Status: DC | PRN

## 2020-10-21 MED ORDER — AMLODIPINE BESYLATE 5 MG PO TABS
10.0000 mg | ORAL_TABLET | Freq: Every morning | ORAL | Status: DC
  Administered 2020-10-22 – 2020-10-23 (×2): 10 mg via ORAL
  Filled 2020-10-21: qty 2

## 2020-10-21 MED ORDER — OXYCODONE HCL 5 MG PO TABS
ORAL_TABLET | ORAL | Status: AC
  Filled 2020-10-21: qty 2

## 2020-10-21 MED ORDER — GABAPENTIN 400 MG PO CAPS
800.0000 mg | ORAL_CAPSULE | Freq: Once | ORAL | Status: AC
  Administered 2020-10-21: 800 mg via ORAL
  Filled 2020-10-21: qty 2

## 2020-10-21 MED ORDER — FENTANYL CITRATE (PF) 100 MCG/2ML IJ SOLN
INTRAMUSCULAR | Status: AC
  Administered 2020-10-21: 50 ug via INTRAVENOUS
  Filled 2020-10-21: qty 2

## 2020-10-21 MED ORDER — OXYCODONE HCL 5 MG PO TABS: 5.0000 mg | ORAL_TABLET | ORAL | Status: DC | PRN

## 2020-10-21 MED ORDER — VITAMIN D3 25 MCG (1000 UNIT) PO TABS
2000.0000 [IU] | ORAL_TABLET | Freq: Every morning | ORAL | Status: DC
  Administered 2020-10-22 – 2020-10-23 (×2): 2000 [IU] via ORAL
  Filled 2020-10-21 (×4): qty 2

## 2020-10-21 MED ORDER — GABAPENTIN 800 MG PO TABS
800.0000 mg | ORAL_TABLET | Freq: Three times a day (TID) | ORAL | Status: DC
  Administered 2020-10-21: 800 mg via ORAL

## 2020-10-21 MED ORDER — LIDOCAINE 2% (20 MG/ML) 5 ML SYRINGE
INTRAMUSCULAR | Status: AC
  Filled 2020-10-21: qty 10

## 2020-10-21 MED ORDER — OXYCODONE HCL 5 MG PO TABS
10.0000 mg | ORAL_TABLET | ORAL | Status: DC | PRN
  Administered 2020-10-21 – 2020-10-23 (×11): 10 mg via ORAL
  Filled 2020-10-21 (×10): qty 2

## 2020-10-21 MED ORDER — LABETALOL HCL 300 MG PO TABS
300.0000 mg | ORAL_TABLET | Freq: Two times a day (BID) | ORAL | Status: DC
  Administered 2020-10-21 – 2020-10-22 (×3): 300 mg via ORAL
  Filled 2020-10-21 (×5): qty 1

## 2020-10-21 MED ORDER — FENTANYL CITRATE (PF) 100 MCG/2ML IJ SOLN: 100.0000 ug | Freq: Once | INTRAMUSCULAR | Status: DC

## 2020-10-21 MED ORDER — LACTATED RINGERS IV SOLN: INTRAVENOUS | Status: DC | PRN

## 2020-10-21 MED ORDER — HYDROMORPHONE HCL 1 MG/ML IJ SOLN
INTRAMUSCULAR | Status: AC
  Administered 2020-10-21: 0.25 mg via INTRAVENOUS
  Filled 2020-10-21: qty 1

## 2020-10-21 MED ORDER — MENTHOL 3 MG MT LOZG: 1.0000 | LOZENGE | OROMUCOSAL | Status: DC | PRN

## 2020-10-21 MED ORDER — LIDOCAINE 2% (20 MG/ML) 5 ML SYRINGE
INTRAMUSCULAR | Status: DC | PRN
  Administered 2020-10-21: 50 mg via INTRAVENOUS

## 2020-10-21 MED ORDER — VANCOMYCIN HCL 1000 MG IV SOLR
INTRAVENOUS | Status: DC | PRN
  Administered 2020-10-21: 1500 mg via INTRAVENOUS

## 2020-10-21 MED ORDER — BUPIVACAINE LIPOSOME 1.3 % IJ SUSP
INTRAMUSCULAR | Status: AC
  Filled 2020-10-21: qty 20

## 2020-10-21 MED ORDER — VANCOMYCIN HCL 1000 MG/200ML IV SOLN: 1000.0000 mg | Freq: Once | INTRAVENOUS | Status: DC

## 2020-10-21 MED ORDER — HYDROCODONE-ACETAMINOPHEN 10-325 MG PO TABS
1.0000 | ORAL_TABLET | ORAL | Status: DC | PRN
  Administered 2020-10-22: 2 via ORAL
  Filled 2020-10-21: qty 2

## 2020-10-21 MED ORDER — ONDANSETRON HCL 4 MG PO TABS: 4.0000 mg | ORAL_TABLET | Freq: Three times a day (TID) | ORAL | Status: DC | PRN

## 2020-10-21 MED ORDER — CHLORHEXIDINE GLUCONATE 0.12 % MT SOLN
15.0000 mL | Freq: Once | OROMUCOSAL | Status: AC
  Administered 2020-10-21: 15 mL via OROMUCOSAL
  Filled 2020-10-21: qty 15

## 2020-10-21 MED ORDER — PHENYLEPHRINE HCL-NACL 10-0.9 MG/250ML-% IV SOLN
INTRAVENOUS | Status: DC | PRN
  Administered 2020-10-21: 15 ug/min via INTRAVENOUS

## 2020-10-21 MED ORDER — THROMBIN 5000 UNITS EX SOLR: OROMUCOSAL | Status: DC | PRN

## 2020-10-21 MED ORDER — BUPIVACAINE-EPINEPHRINE (PF) 0.5% -1:200000 IJ SOLN
INTRAMUSCULAR | Status: DC | PRN
  Administered 2020-10-21: 10 mL

## 2020-10-21 MED ORDER — ISOSORBIDE MONONITRATE ER 60 MG PO TB24
60.0000 mg | ORAL_TABLET | Freq: Every morning | ORAL | Status: DC
  Administered 2020-10-22 – 2020-10-23 (×2): 60 mg via ORAL
  Filled 2020-10-21 (×2): qty 1

## 2020-10-21 MED ORDER — EPHEDRINE SULFATE-NACL 50-0.9 MG/10ML-% IV SOSY
PREFILLED_SYRINGE | INTRAVENOUS | Status: DC | PRN
  Administered 2020-10-21: 10 mg via INTRAVENOUS

## 2020-10-21 MED ORDER — BISACODYL 10 MG RE SUPP: 10.0000 mg | Freq: Every day | RECTAL | Status: DC | PRN

## 2020-10-21 MED ORDER — VALBENAZINE TOSYLATE 40 MG PO CAPS
80.0000 mg | ORAL_CAPSULE | Freq: Every day | ORAL | Status: DC
  Administered 2020-10-21 – 2020-10-22 (×2): 80 mg via ORAL
  Filled 2020-10-21 (×3): qty 2

## 2020-10-21 MED ORDER — ROCURONIUM BROMIDE 10 MG/ML (PF) SYRINGE
PREFILLED_SYRINGE | INTRAVENOUS | Status: AC
  Filled 2020-10-21: qty 10

## 2020-10-21 MED ORDER — BACITRACIN ZINC 500 UNIT/GM EX OINT
TOPICAL_OINTMENT | CUTANEOUS | Status: DC | PRN
  Administered 2020-10-21: 1 via TOPICAL

## 2020-10-21 MED ORDER — HYDROMORPHONE HCL 1 MG/ML IJ SOLN
0.2500 mg | INTRAMUSCULAR | Status: AC | PRN
  Administered 2020-10-21 (×2): 0.5 mg via INTRAVENOUS
  Administered 2020-10-21 (×3): 0.25 mg via INTRAVENOUS

## 2020-10-21 MED ORDER — SODIUM BICARBONATE 650 MG PO TABS
650.0000 mg | ORAL_TABLET | Freq: Two times a day (BID) | ORAL | Status: DC
  Administered 2020-10-21 – 2020-10-23 (×4): 650 mg via ORAL
  Filled 2020-10-21 (×5): qty 1

## 2020-10-21 MED ORDER — DEXMEDETOMIDINE (PRECEDEX) IN NS 20 MCG/5ML (4 MCG/ML) IV SYRINGE
PREFILLED_SYRINGE | INTRAVENOUS | Status: DC | PRN
  Administered 2020-10-21: 8 ug via INTRAVENOUS
  Administered 2020-10-21: 4 ug via INTRAVENOUS
  Administered 2020-10-21: 8 ug via INTRAVENOUS

## 2020-10-21 MED ORDER — HYDROCODONE-ACETAMINOPHEN 10-325 MG PO TABS: 1.0000 | ORAL_TABLET | ORAL | Status: DC | PRN

## 2020-10-21 MED ORDER — PANTOPRAZOLE SODIUM 20 MG PO TBEC
20.0000 mg | DELAYED_RELEASE_TABLET | Freq: Two times a day (BID) | ORAL | Status: DC
  Administered 2020-10-21 – 2020-10-23 (×4): 20 mg via ORAL
  Filled 2020-10-21 (×4): qty 1

## 2020-10-21 MED ORDER — FENTANYL CITRATE (PF) 100 MCG/2ML IJ SOLN
50.0000 ug | INTRAMUSCULAR | Status: AC | PRN
  Administered 2020-10-21: 100 ug via INTRAVENOUS

## 2020-10-21 MED ORDER — MIDAZOLAM HCL 5 MG/5ML IJ SOLN
INTRAMUSCULAR | Status: DC | PRN
  Administered 2020-10-21: 2 mg via INTRAVENOUS

## 2020-10-21 SURGICAL SUPPLY — 62 items
APL SKNCLS STERI-STRIP NONHPOA (GAUZE/BANDAGES/DRESSINGS) ×1
BENZOIN TINCTURE PRP APPL 2/3 (GAUZE/BANDAGES/DRESSINGS) ×2 IMPLANT
BLADE CLIPPER SURG (BLADE) IMPLANT
BUR MATCHSTICK NEURO 3.0 LAGG (BURR) ×2 IMPLANT
BUR PRECISION FLUTE 6.0 (BURR) ×2 IMPLANT
CAGE ALTERA 10X31X9-13 15D (Cage) ×1 IMPLANT
CANISTER SUCT 3000ML PPV (MISCELLANEOUS) ×2 IMPLANT
CAP LOCK DLX THRD (Cap) ×4 IMPLANT
CARTRIDGE OIL MAESTRO DRILL (MISCELLANEOUS) ×1 IMPLANT
CNTNR URN SCR LID CUP LEK RST (MISCELLANEOUS) ×1 IMPLANT
CONT SPEC 4OZ STRL OR WHT (MISCELLANEOUS) ×2
COVER BACK TABLE 60X90IN (DRAPES) ×2 IMPLANT
COVER WAND RF STERILE (DRAPES) ×2 IMPLANT
DECANTER SPIKE VIAL GLASS SM (MISCELLANEOUS) ×2 IMPLANT
DIFFUSER DRILL AIR PNEUMATIC (MISCELLANEOUS) ×2 IMPLANT
DRAPE C-ARM 42X72 X-RAY (DRAPES) ×4 IMPLANT
DRAPE HALF SHEET 40X57 (DRAPES) ×2 IMPLANT
DRAPE LAPAROTOMY 100X72X124 (DRAPES) ×2 IMPLANT
DRAPE SURG 17X23 STRL (DRAPES) ×8 IMPLANT
DRSG OPSITE POSTOP 4X6 (GAUZE/BANDAGES/DRESSINGS) ×2 IMPLANT
ELECT BLADE 4.0 EZ CLEAN MEGAD (MISCELLANEOUS) ×2
ELECT REM PT RETURN 9FT ADLT (ELECTROSURGICAL) ×2
ELECTRODE BLDE 4.0 EZ CLN MEGD (MISCELLANEOUS) ×1 IMPLANT
ELECTRODE REM PT RTRN 9FT ADLT (ELECTROSURGICAL) ×1 IMPLANT
EVACUATOR 1/8 PVC DRAIN (DRAIN) IMPLANT
GAUZE 4X4 16PLY RFD (DISPOSABLE) ×2 IMPLANT
GLOVE BIO SURGEON STRL SZ8 (GLOVE) ×4 IMPLANT
GLOVE BIO SURGEON STRL SZ8.5 (GLOVE) ×4 IMPLANT
GLOVE EXAM NITRILE XL STR (GLOVE) IMPLANT
GOWN STRL REUS W/ TWL LRG LVL3 (GOWN DISPOSABLE) IMPLANT
GOWN STRL REUS W/ TWL XL LVL3 (GOWN DISPOSABLE) ×2 IMPLANT
GOWN STRL REUS W/TWL 2XL LVL3 (GOWN DISPOSABLE) IMPLANT
GOWN STRL REUS W/TWL LRG LVL3 (GOWN DISPOSABLE)
GOWN STRL REUS W/TWL XL LVL3 (GOWN DISPOSABLE) ×4
HEMOSTAT POWDER KIT SURGIFOAM (HEMOSTASIS) ×2 IMPLANT
KIT BASIN OR (CUSTOM PROCEDURE TRAY) ×2 IMPLANT
KIT TURNOVER KIT B (KITS) ×2 IMPLANT
MILL MEDIUM DISP (BLADE) ×2 IMPLANT
NDL HYPO 21X1.5 SAFETY (NEEDLE) IMPLANT
NEEDLE HYPO 21X1.5 SAFETY (NEEDLE) IMPLANT
NEEDLE HYPO 22GX1.5 SAFETY (NEEDLE) ×2 IMPLANT
NS IRRIG 1000ML POUR BTL (IV SOLUTION) ×2 IMPLANT
OIL CARTRIDGE MAESTRO DRILL (MISCELLANEOUS) ×2
PACK LAMINECTOMY NEURO (CUSTOM PROCEDURE TRAY) ×2 IMPLANT
PAD ARMBOARD 7.5X6 YLW CONV (MISCELLANEOUS) ×6 IMPLANT
PATTIES SURGICAL .5 X1 (DISPOSABLE) IMPLANT
PUTTY DBM 10CC CALC GRAN (Putty) ×1 IMPLANT
ROD CURVED TI 6.35X45 (Rod) ×2 IMPLANT
SCREW PA DLX CREO 7.5X40 (Screw) ×2 IMPLANT
SCREW PA DLX CREO 7.5X45 (Screw) ×2 IMPLANT
SPONGE LAP 4X18 RFD (DISPOSABLE) IMPLANT
SPONGE NEURO XRAY DETECT 1X3 (DISPOSABLE) IMPLANT
SPONGE SURGIFOAM ABS GEL 100 (HEMOSTASIS) IMPLANT
STRIP CLOSURE SKIN 1/2X4 (GAUZE/BANDAGES/DRESSINGS) ×2 IMPLANT
SUT VIC AB 1 CT1 18XBRD ANBCTR (SUTURE) ×2 IMPLANT
SUT VIC AB 1 CT1 8-18 (SUTURE) ×4
SUT VIC AB 2-0 CP2 18 (SUTURE) ×4 IMPLANT
SYR 20ML LL LF (SYRINGE) IMPLANT
TOWEL GREEN STERILE (TOWEL DISPOSABLE) ×2 IMPLANT
TOWEL GREEN STERILE FF (TOWEL DISPOSABLE) ×2 IMPLANT
TRAY FOLEY MTR SLVR 16FR STAT (SET/KITS/TRAYS/PACK) ×2 IMPLANT
WATER STERILE IRR 1000ML POUR (IV SOLUTION) ×2 IMPLANT

## 2020-10-21 NOTE — Anesthesia Procedure Notes (Signed)
Procedure Name: Intubation Performed by: Griffin Dakin, CRNA Pre-anesthesia Checklist: Patient identified, Emergency Drugs available, Suction available and Patient being monitored Patient Re-evaluated:Patient Re-evaluated prior to induction Oxygen Delivery Method: Circle system utilized Preoxygenation: Pre-oxygenation with 100% oxygen Induction Type: IV induction Ventilation: Mask ventilation with difficulty and Oral airway inserted - appropriate to patient size Laryngoscope Size: Mac and 3 Grade View: Grade I Tube type: Oral Tube size: 7.0 mm Number of attempts: 1 Airway Equipment and Method: Stylet and Oral airway Placement Confirmation: ETT inserted through vocal cords under direct vision,  positive ETCO2 and breath sounds checked- equal and bilateral Secured at: 21 cm Tube secured with: Tape Dental Injury: Teeth and Oropharynx as per pre-operative assessment

## 2020-10-21 NOTE — Op Note (Signed)
Brief history: The patient is a 65 year old obese black female who has complained of back and right greater left leg pain consistent with neurogenic claudication/lumbar radiculopathy.  She has failed medical management and was worked up with a lumbar MRI and lumbar x-rays which demonstrated L4-5 facet arthropathy, stenosis, spondylolisthesis, etc.  I discussed the various treatment options with her.  She has decided proceed with surgery.  Preoperative diagnosis: L4-5 spondylolisthesis, facet arthropathy, degenerative disc disease, spinal stenosis compressing both the L4 and the L5 nerve roots; lumbago; lumbar radiculopathy; neurogenic claudication  Postoperative diagnosis: The same  Procedure: Bilateral L4-5 laminotomy/foraminotomies/medial facetectomy to decompress the bilateral L4 and L5 nerve roots(the work required to do this was in addition to the work required to do the posterior lumbar interbody fusion because of the patient's spinal stenosis, facet arthropathy. Etc. requiring a wide decompression of the nerve roots.);  L4-5 transforaminal lumbar interbody fusion with local morselized autograft bone and Zimmer DBM; insertion of interbody prosthesis at L4-5 (globus peek expandable interbody prosthesis); posterior nonsegmental instrumentation from L4 to L5 with globus titanium pedicle screws and rods; posterior lateral arthrodesis at L4-5 with local morselized autograft bone and Zimmer DBM.  Surgeon: Dr. Earle Gell  Asst.: Arnetha Massy, NP  Anesthesia: Gen. endotracheal  Estimated blood loss: 250 cc  Drains: None  Complications: None  Description of procedure: The patient was brought to the operating room by the anesthesia team. General endotracheal anesthesia was induced. The patient was turned to the prone position on the [place] table. The patient's lumbosacral region was then prepared with Betadine scrub and Betadine solution. Sterile drapes were applied.  I then injected the area  to be incised with Marcaine with epinephrine solution. I then used the scalpel to make a linear midline incision over the L4-5 interspace. I then used electrocautery to perform a bilateral subperiosteal dissection exposing the spinous process and lamina of L4-5. We then obtained intraoperative radiograph to confirm our location. We then inserted the Verstrac retractor to provide exposure.  I began the decompression by using the high speed drill to perform laminotomies at L4-5 bilaterally. We then used the Kerrison punches to widen the laminotomy and removed the ligamentum flavum at L4-5 bilaterally. We used the Kerrison punches to remove the medial facets at L4-5 bilaterally. We performed wide foraminotomies about the bilateral L4 and L5 nerve roots completing the decompression.  We now turned our attention to the posterior lumbar interbody fusion. I used a scalpel to incise the intervertebral disc at L4-5 bilaterally. I then performed a partial intervertebral discectomy at L4-5 bilaterally using the pituitary forceps. We prepared the vertebral endplates at N2-3 bilaterally for the fusion by removing the soft tissues with the curettes. We then used the trial spacers to pick the appropriate sized interbody prosthesis. We prefilled his prosthesis with a combination of local morselized autograft bone that we obtained during the decompression as well as Zimmer DBM. We inserted the prefilled prosthesis into the interspace at L4-5, we then turned and expanded the prosthesis. There was a good snug fit of the prosthesis in the interspace. We then filled and the remainder of the intervertebral disc space with local morselized autograft bone and Zimmer DBM. This completed the posterior lumbar interbody arthrodesis.  During the decompression and insertion of the prosthesis the assistant protected the thecal sac and nerve roots with the D'Errico retractor.  We now turned attention to the instrumentation. Under  fluoroscopic guidance we cannulated the bilateral L4 and L5 pedicles with the bone probe.  We then removed the bone probe. We then tapped the pedicle with a 6.5 millimeter tap. We then removed the tap. We probed inside the tapped pedicle with a ball probe to rule out cortical breaches. We then inserted a 7.5 x 40 and 45 millimeter pedicle screw into the L4 and L5 pedicles bilaterally under fluoroscopic guidance. We then palpated along the medial aspect of the pedicles to rule out cortical breaches. There were none. The nerve roots were not injured. We then connected the unilateral pedicle screws with a lordotic rod. We compressed the construct and secured the rod in place with the caps. We then tightened the caps appropriately. This completed the instrumentation from L4-5 bilaterally.  We now turned our attention to the posterior lateral arthrodesis at L4-5. We used the high-speed drill to decorticate the remainder of the facets, pars, transverse process at L4-5. We then applied a combination of local morselized autograft bone and Zimmer DBM over these decorticated posterior lateral structures. This completed the posterior lateral arthrodesis.  We then obtained hemostasis using bipolar electrocautery. We irrigated the wound out with bacitracin solution. We inspected the thecal sac and nerve roots and noted they were well decompressed. We then removed the retractor.  We injected Exparel . We reapproximated patient's thoracolumbar fascia with interrupted #1 Vicryl suture. We reapproximated patient's subcutaneous tissue with interrupted 2-0 Vicryl suture. The reapproximated patient's skin with Steri-Strips and benzoin. The wound was then coated with bacitracin ointment. A sterile dressing was applied. The drapes were removed. The patient was subsequently returned to the supine position where they were extubated by the anesthesia team. He was then transported to the post anesthesia care unit in stable condition. All  sponge instrument and needle counts were reportedly correct at the end of this case.

## 2020-10-21 NOTE — H&P (Signed)
Subjective: The patient is a 65 year old morbidly obese black female who has complained of back and right greater left leg pain.  She has failed medical management.  She was worked up with a lumbar MRI and lumbar x-rays which demonstrated L4-5 facet arthropathy, spondylolisthesis, etc.  I discussed the various treatment options with her.  She has decided to proceed with surgery after weighing the risk, benefits and alternatives.  Past Medical History:  Diagnosis Date  . Alcohol abuse 05/09/2019  . Alcohol use disorder, severe, dependence ([place]) 08/27/2017  . Alcohol withdrawal ([place]) 08/09/2017  . Anxiety   . ARF (acute renal failure) ([place]) 04/09/2020  . Bilateral low back pain with bilateral sciatica 01/05/2019  . Bipolar and related disorder ([place])   . Blood transfusion without reported diagnosis   . Breast cancer ([place])    right lumpectemy and lymph node   . Cancer ([place]) 09/06/2017  . Chest pain 05/31/2020  . Chest pain, rule out acute myocardial infarction 05/29/2020  . CHF (congestive heart failure) ([place])   . Chronic back pain   . Chronic diastolic CHF (congestive heart failure) ([place]) 05/09/2019  . Chronic kidney disease    "Stage IV" - states due to hypertension  . Chronic kidney disease, stage III (moderate) ([place]) 11/17/2016  . Chronic low back pain 09/06/2017   Disc levels:    No abnormality at L2-3 or above.    L3-4:Mild bulging of the disc.No stenosis.    L4-5: Bilateral facet arthropathy with gaping, fluid-filled joints.  Joint edema. Anterolisthesis would likely occur at this level with  standing and flexion. Disc degeneration with mild bulging of the  disc. Mild narrowing of the lateral recesses and foramina, which  would likely worsen  . Cigarette smoker 12/31/2017  . CKD (chronic kidney disease) stage 4, GFR 15-29 ml/min (HCC) 04/18/2020  . Cyst of ovary   . Depression   . Depression with anxiety 08/21/2016  . Dizziness 02/24/2018  . DOE (dyspnea on exertion) 12/30/2017    12/30/2017   Walked RA x one lap @ 185 stopped due to  Sob/ lightheaded weak with sats 96% at end - Spirometry 12/30/2017  FEV1 1.27 (68%)  Ratio 99 s curvature / abn effort dep portion only    . Dyspnea   . Dyspnea and respiratory abnormality 02/13/2013   Formatting of this note might be different from the original. STORY: PSG on 05/20/12 showed no OSA but upper airway resistance syndrome, AHI 3.9, severe snoring was noted. ESS=8. UARS. Advise positional therapy, weight loss program, regular exercise.  . Essential hypertension 07/06/2017  . GERD (gastroesophageal reflux disease)   . Grief reaction with prolonged bereavement 09/06/2017   Partner of 18 yrs left 2018-pt admits she still loves her  . Hepatitis C    recieved treatment for Hep C  . Hepatitis C virus infection cured after antiviral drug therapy 12/17/2012   Telephone Encounter - Dub Mikes, RN - 12/28/2016 9:54 AM EDT Patient called for HCV RNA test results. EOT 08-26-16 - not detected. 12 week post treatment 12-11-16 - not detected. Informed patient that she was cured. Patient verbalized understanding.        Marland Kitchen History of breast cancer 02/13/2013  . Hx of bipolar disorder 01/31/2016  . Hypertension   . Hypertensive emergency 10/21/2017  . Left-sided weakness   . MDD (major depressive disorder), recurrent severe, without psychosis ([place])   . Medication intolerance 09/06/2017   Remeron-appetite stimulant/Dizziness/Dysphoria  . Microcytic anemia 10/21/2017  . Morbid obesity due  to excess calories ([place]) 02/13/2013   BMI 42.9  . MRSA infection    of breast incision  . Nausea 04/09/2020   Formatting of this note might be different from the original. Added automatically from request for surgery [id number]  . Near syncope 05/29/2020  . Neuroleptic-induced tardive dyskinesia 09/06/2017   Abilify  . Opioid dependence ([place])    Has been [facility] in [facility] in past  . Opioid use disorder, severe, dependence ([place]) 08/27/2017  . Oral aphthous ulcer  06/25/2015   Last Assessment & Plan:  Formatting of this note might be different from the original. - Appear to be resolving as a be expected in the case of aphthous stomatitis. - Continue topical therapy with viscous lidocaine.  Recommended trying high-dose anti-inflammatory such as 600 mg of ibuprofen as needed. - She'll return to ENT clinic if symptoms do not resolve.  . Phlebitis after infusion 10/28/2017   Rt antecubital fossa  . Pre-operative cardiovascular examination 06/13/2018  . Restless legs syndrome 02/13/2013   Formatting of this note might be different from the original. IMPRESSION: Possible. Will follow.  . Subacute dyskinesia due to drug 02/13/2013   Formatting of this note might be different from the original. STORY: Tardive dyskinesia and mild limb dyskinesia, LE>UE which likely due to prolonged antipsychotic used, abilify. Couldn't tolerate artane, gabapentin, clonazepam and xenazine.`E1o3L`IMPRESSION: Well tolerated depakote 250mg  bid, mood aspect is better as well. Will increase to 500mg  bid. ADR was discussed.  RTC 4-6 weeks.  . Substance abuse ([place])   . Substance induced mood disorder ([place]) 08/13/2017  . TIA (transient ischemic attack) 2020   P/w BP >230/120 and neurologic symptoms, presumed TIA    Past Surgical History:  Procedure Laterality Date  . BACK SURGERY  1990  . BREAST SURGERY Right 2010   "breast cancer survivor" - states partial mastectomy and nodes  . COLONOSCOPY     [facility]  . ESOPHAGOGASTRODUODENOSCOPY     [facility]  . ESOPHAGOGASTRODUODENOSCOPY  04/18/2020   [facility]  . LAPAROSCOPIC CHOLECYSTECTOMY  2002  . LAPAROSCOPIC INCISIONAL / UMBILICAL / VENTRAL HERNIA REPAIR  04/13/2018   with BARD 15x 81EH mesh (supraumbilical)  . LAPAROSCOPIC LYSIS OF ADHESIONS  07/12/2018   Procedure: LAPAROSCOPIC LYSIS OF ADHESIONS;  Surgeon: Isabel Caprice, MD;  Location: [facility];  Service: Gynecology;;  . MULTIPLE TOOTH EXTRACTIONS    . MYOMECTOMY      x 2 prior to hysterectomy  . ROBOTIC ASSISTED BILATERAL SALPINGO OOPHERECTOMY Right 07/12/2018   Procedure: XI ROBOTIC ASSISTED RIGHT SALPINGO OOPHORECTOMY;  Surgeon: Isabel Caprice, MD;  Location: [facility];  Service: Gynecology;  Laterality: Right;  . TOTAL ABDOMINAL HYSTERECTOMY     fibroids   . UPPER GASTROINTESTINAL ENDOSCOPY    . WISDOM TOOTH EXTRACTION      Allergies  Allergen Reactions  . Abilify [Aripiprazole] Other (See Comments)    Tardive dyskinesia Oral  . Remeron [Mirtazapine] Other (See Comments)    Wgt stimulation /gain, Dizziness, Patient says "can tolerate"  . Trazodone And Nefazodone Other (See Comments)    Nightmares/sleep diturbance  . Flexeril [Cyclobenzaprine] Other (See Comments)    Pt states Flexeril makes her feel depressed   . Amoxicillin Diarrhea and Other (See Comments)    NOTE the patient has had PCN WITHOUT reaction Has patient had a PCN reaction causing immediate rash, facial/tongue/throat swelling, SOB or lightheadedness with hypotension: No Has patient had a PCN reaction causing severe rash involving mucus membranes or  skin necrosis: No Has patient had a PCN reaction that required hospitalization: No Has patient had a PCN reaction occurring within the last 10 years: No If all of the above answers are "NO", then may proceed with Cephalosporin use.     Social History   Tobacco Use  . Smoking status: Current Every Day Smoker    Packs/day: 1.00    Types: Cigarettes  . Smokeless tobacco: Never Used  Substance Use Topics  . Alcohol use: Not Currently    Comment: hx over 30 years    Family History  Problem Relation Age of Onset  . Heart attack Mother   . Breast cancer Mother 74  . Dementia Mother   . Cancer Father 35       died of bleeding from kidneys  . Colon cancer Neg Hx   . Esophageal cancer Neg Hx   . Stomach cancer Neg Hx   . Rectal cancer Neg Hx    Prior to Admission medications   Medication Sig Start Date End Date Taking?  Authorizing Provider  amLODipine (NORVASC) 10 MG tablet TAKE 1 TABLET BY MOUTH DAILY Patient taking differently: Take 10 mg by mouth in the morning. 09/26/20  Yes Wendie Agreste, MD  Cholecalciferol (VITAMIN D3) 50 MCG (2000 UT) capsule Take 1 capsule (2,000 Units total) by mouth daily. Patient taking differently: Take 2,000 Units by mouth in the morning. 09/26/20  Yes Wendie Agreste, MD  diphenhydramine-acetaminophen (TYLENOL PM) 25-500 MG TABS tablet Take 1 tablet by mouth at bedtime.   Yes [provider]  FEROSUL 325 (65 Fe) MG tablet TAKE 1 TABLET BY MOUTH DAILY WITH BREAKFAST Patient taking differently: Take 325 mg by mouth daily with breakfast. 09/25/20  Yes Wendie Agreste, MD  FLUoxetine (PROZAC) 40 MG capsule Take 40 mg by mouth in the morning. 11/09/18  Yes [provider]  gabapentin (NEURONTIN) 800 MG tablet TAKE 1 TABLET(800 MG) BY MOUTH THREE TIMES DAILY Patient taking differently: Take 800 mg by mouth 3 (three) times daily. 09/23/20  Yes Wendie Agreste, MD  hydrALAZINE (APRESOLINE) 100 MG tablet Take 100 mg by mouth 3 (three) times daily. 09/26/20  Yes [provider]  HYDROcodone-acetaminophen (NORCO) 10-325 MG tablet Take 1 tablet by mouth every 6 (six) hours as needed (pain). 10/06/20  Yes [provider]  INGREZZA 80 MG CAPS Take 80 mg by mouth at bedtime. 10/15/17  Yes [provider]  isosorbide mononitrate (IMDUR) 60 MG 24 hr tablet TAKE 1 TABLET BY MOUTH DAILY Patient taking differently: Take 60 mg by mouth in the morning. 09/25/20  Yes Wendie Agreste, MD  labetalol (NORMODYNE) 300 MG tablet Take 300 mg by mouth 2 (two) times daily.   Yes [provider]  lidocaine (LIDODERM) 5 % Place 1 patch onto the skin daily. Remove & Discard patch within 12 hours or as directed by MD 10/10/20  Yes Nuala Alpha A, PA-C  melatonin 5 MG TABS Take 5 mg by mouth at bedtime.   Yes [provider]  methocarbamol (ROBAXIN)  500 MG tablet Take 500-1,000 mg by mouth every 6 (six) hours as needed for spasms. 08/07/20  Yes [provider]  ondansetron (ZOFRAN) 4 MG tablet TAKE 1 TABLET BY MOUTH EVERY 8 HOURS AS NEEDED FOR NAUSEA OR VOMITING Patient taking differently: Take 4 mg by mouth every 8 (eight) hours as needed for nausea or vomiting. 01/12/20  Yes Wendie Agreste, MD  pantoprazole (PROTONIX) 20 MG tablet Take  1 tablet (20 mg total) by mouth 2 (two) times daily. 06/01/20  Yes Aline August, MD  HYDROcodone-acetaminophen (NORCO/VICODIN) 5-325 MG tablet Take 1 tablet by mouth every 6 (six) hours as needed. Patient not taking: Reported on 10/09/2020 08/26/20   Rayna Sexton, PA-C  sodium bicarbonate 650 MG tablet Take 650 mg by mouth 2 (two) times daily. 09/04/20   [provider]     Review of Systems  Positive ROS: As above  All other systems have been reviewed and were otherwise negative with the exception of those mentioned in the HPI and as above.  Objective: Vital signs in last 24 hours: Temp:  [98.4 F (36.9 C)] 98.4 F (36.9 C) (04/11 0854) Pulse Rate:  [71] 71 (04/11 0854) Resp:  [17] 17 (04/11 0854) BP: (139)/(65) 139/65 (04/11 0854) SpO2:  [100 %] 100 % (04/11 0854) Weight:  [100.7 kg] 100.7 kg (04/11 0854) Estimated body mass index is 40.6 kg/m as calculated from the following:   Height as of this encounter: 5\' 2"  (1.575 m).   Weight as of this encounter: 100.7 kg.   General Appearance: Alert, obese Head: Normocephalic, without obvious abnormality, atraumatic Eyes: PERRL, conjunctiva/corneas clear, EOM's intact,    Ears: Normal  Throat: Normal  Neck: Supple, Back: Obese Lungs: Clear to auscultation bilaterally, respirations unlabored Heart: Regular rate and rhythm, no murmur, rub or gallop Abdomen: Soft, non-tender Extremities: Extremities normal, atraumatic, no cyanosis or edema Skin: unremarkable  NEUROLOGIC:   Mental status: alert and oriented,Motor Exam -  grossly normal Sensory Exam - grossly normal Reflexes:  Coordination - grossly normal Gait - grossly normal Balance - grossly normal Cranial Nerves: I: smell Not tested  II: visual acuity  OS: Normal  OD: Normal   II: visual fields Full to confrontation  II: pupils Equal, round, reactive to light  III,VII: ptosis None  III,IV,VI: extraocular muscles  Full ROM  V: mastication Normal  V: facial light touch sensation  Normal  V,VII: corneal reflex  Present  VII: facial muscle function - upper  Normal  VII: facial muscle function - lower Normal  VIII: hearing Not tested  IX: soft palate elevation  Normal  IX,X: gag reflex Present  XI: trapezius strength  5/5  XI: sternocleidomastoid strength 5/5  XI: neck flexion strength  5/5  XII: tongue strength  Normal    Data Review Lab Results  Component Value Date   WBC 6.7 10/17/2020   HGB 10.0 (L) 10/17/2020   HCT 33.8 (L) 10/17/2020   MCV 95.5 10/17/2020   PLT 275 10/17/2020   Lab Results  Component Value Date   NA 138 10/21/2020   K 4.6 10/21/2020   CL 107 10/21/2020   CO2 20 (L) 10/21/2020   BUN 39 (H) 10/21/2020   CREATININE 3.58 (H) 10/21/2020   GLUCOSE 108 (H) 10/21/2020   Lab Results  Component Value Date   INR 1.0 07/25/2020    Assessment/Plan: L4-5 facet arthropathy, spondylolisthesis, lumbago, lumbar radiculopathy, neurogenic claudication: I have discussed the situation with the patient.  I have reviewed her imaging studies with her and pointed out the abnormalities.  We have discussed the various treatment options including surgery.  I have described the surgical treatment option of an L4-5 decompression, instrumentation and fusion.  I have shown her surgical models.  I have given her a surgical pamphlet.  We have discussed the risk, benefits, alternatives, expected postoperative course, and likelihood of achieving our goals with surgery.  I have answered all her questions.  She has decided proceed with  surgery.  Ophelia Charter 10/21/2020 11:56 AM

## 2020-10-21 NOTE — Anesthesia Postprocedure Evaluation (Signed)
Anesthesia Post Note  Patient: Ilisha Blust  Procedure(s) Performed: POSTERIOR LUMBAR INTERBODY FUSION, INTERBODY PROSTHESIS, POSTERIOR INSTRUMENTATION LUMBAR-FOUR (N/A Spine Lumbar)     Patient location during evaluation: PACU Anesthesia Type: General Level of consciousness: awake and alert Pain management: pain level controlled Vital Signs Assessment: post-procedure vital signs reviewed and stable Respiratory status: spontaneous breathing, nonlabored ventilation, respiratory function stable and patient connected to nasal cannula oxygen Cardiovascular status: blood pressure returned to baseline and stable Postop Assessment: no apparent nausea or vomiting Anesthetic complications: no   No complications documented.  Last Vitals:  Vitals:   10/21/20 1800 10/21/20 1810  BP:  (!) 146/56  Pulse: 73 72  Resp: (!) 23 13  Temp:    SpO2: 98% 99%               Vermell Madrid,W. EDMOND

## 2020-10-21 NOTE — Progress Notes (Signed)
Pharmacy Antibiotic Note  Sherry [name] is a 65 y.o. female admitted on 10/21/2020 with back and leg pain consistent with claudication / lumbar radiculopathy.  Pt underwent surgical intervention on 4/11.  She received Vancomycin 1500mg  pre-op ~ 0900.  Pharmacy has been consulted to dose x 1 post-op.  Pt does nothave a drain in place.  Prolonged prophylaxis not indicated.    Of note, pt has CKD with SCr 3.5, estimated CrCl ~ 15-20 ml/min.  Plan: Vancomycin 1gm IV x 1 dose.  Will schedule for 4/12 AM given renal function. Pharmacy will sign off.   Height: 5\' 2"  (157.5 cm) Weight: 100.7 kg (222 lb) IBW/kg (Calculated) : 50.1  Temp (24hrs), Avg:98.3 F (36.8 C), Min:97.7 F (36.5 C), Max:98.5 F (36.9 C)  Recent Labs  Lab 10/17/20 1428 10/21/20 0937  WBC 6.7  --   CREATININE 3.27* 3.58*    Estimated Creatinine Clearance: 17.6 mL/min (A) (by C-G formula based on SCr of 3.58 mg/dL (H)).    Allergies  Allergen Reactions  . Abilify [Aripiprazole] Other (See Comments)    Tardive dyskinesia Oral  . Remeron [Mirtazapine] Other (See Comments)    Wgt stimulation /gain, Dizziness, Patient says "can tolerate"  . Trazodone And Nefazodone Other (See Comments)    Nightmares/sleep diturbance  . Flexeril [Cyclobenzaprine] Other (See Comments)    Pt states Flexeril makes her feel depressed   . Amoxicillin Diarrhea and Other (See Comments)    NOTE the patient has had PCN WITHOUT reaction Has patient had a PCN reaction causing immediate rash, facial/tongue/throat swelling, SOB or lightheadedness with hypotension: No Has patient had a PCN reaction causing severe rash involving mucus membranes or skin necrosis: No Has patient had a PCN reaction that required hospitalization: No Has patient had a PCN reaction occurring within the last 10 years: No If all of the above answers are "NO", then may proceed with Cephalosporin use.     Thank you for allowing pharmacy to be a part of this patient's  care.  [employer], Pharm.D., BCPS Clinical Pharmacist  **Pharmacist phone directory can be found on [email or web address] listed under [place].  10/21/2020 8:35 PM

## 2020-10-21 NOTE — Transfer of Care (Signed)
Immediate Anesthesia Transfer of Care Note  Patient: Sherry [name]  Procedure(s) Performed: POSTERIOR LUMBAR INTERBODY FUSION, INTERBODY PROSTHESIS, POSTERIOR INSTRUMENTATION LUMBAR-FOUR (N/A Spine Lumbar)  Patient Location: PACU  Anesthesia Type:General  Level of Consciousness: drowsy and patient cooperative  Airway & Oxygen Therapy: Patient Spontanous Breathing and Patient connected to nasal cannula oxygen  Post-op Assessment: Report given to RN and Post -op Vital signs reviewed and stable  Post vital signs: Reviewed and stable  Last Vitals:  Vitals Value Taken Time  BP 142/63 10/21/20 1652  Temp    Pulse 65 10/21/20 1657  Resp 18 10/21/20 1657  SpO2 91 % 10/21/20 1657  Vitals shown include unvalidated device data.  Last Pain:  Vitals:   10/21/20 0927  TempSrc:   PainSc: 9       Patients Stated Pain Goal: 2 (92/44/62 8638)  Complications: No complications documented.

## 2020-10-22 LAB — BASIC METABOLIC PANEL
Anion gap: 11 (ref 5–15)
BUN: 39 mg/dL — ABNORMAL HIGH (ref 8–23)
CO2: 18 mmol/L — ABNORMAL LOW (ref 22–32)
Calcium: 8.6 mg/dL — ABNORMAL LOW (ref 8.9–10.3)
Chloride: 105 mmol/L (ref 98–111)
Creatinine, Ser: 3.6 mg/dL — ABNORMAL HIGH (ref 0.44–1.00)
GFR, Estimated: 14 mL/min — ABNORMAL LOW (ref >60)
Glucose, Bld: 109 mg/dL — ABNORMAL HIGH (ref 70–99)
Potassium: 5.1 mmol/L (ref 3.5–5.1)
Sodium: 134 mmol/L — ABNORMAL LOW (ref 135–145)

## 2020-10-22 LAB — CBC
HCT: 28.8 % — ABNORMAL LOW (ref 36.0–46.0)
Hemoglobin: 8.7 g/dL — ABNORMAL LOW (ref 12.0–15.0)
MCH: 28.5 pg (ref 26.0–34.0)
MCHC: 30.2 g/dL (ref 30.0–36.0)
MCV: 94.4 fL (ref 80.0–100.0)
Platelets: 218 10*3/uL (ref 150–400)
RBC: 3.05 MIL/uL — ABNORMAL LOW (ref 3.87–5.11)
RDW: 15.9 % — ABNORMAL HIGH (ref 11.5–15.5)
WBC: 11.9 10*3/uL — ABNORMAL HIGH (ref 4.0–10.5)
nRBC: 0 % (ref 0.0–0.2)

## 2020-10-22 LAB — SARS CORONAVIRUS 2 (TAT 6-24 HRS): SARS Coronavirus 2: NEGATIVE

## 2020-10-22 MED ORDER — GABAPENTIN 800 MG PO TABS: 800.0000 mg | ORAL_TABLET | Freq: Every day | ORAL | Status: DC

## 2020-10-22 MED ORDER — COVID-19 MRNA VACC (MODERNA) 50 MCG/0.25ML IM SUSP
0.2500 mL | Freq: Once | INTRAMUSCULAR | Status: AC
  Administered 2020-10-22: 0.25 mL via INTRAMUSCULAR
  Filled 2020-10-22: qty 0.25

## 2020-10-22 MED ORDER — GABAPENTIN 400 MG PO CAPS
800.0000 mg | ORAL_CAPSULE | Freq: Every day | ORAL | Status: DC
  Administered 2020-10-22: 800 mg via ORAL
  Filled 2020-10-22: qty 2

## 2020-10-22 MED ORDER — VANCOMYCIN HCL 500 MG/100ML IV SOLN
500.0000 mg | INTRAVENOUS | Status: DC
  Administered 2020-10-22: 500 mg via INTRAVENOUS
  Filled 2020-10-22 (×2): qty 100

## 2020-10-22 MED ORDER — GABAPENTIN 400 MG PO CAPS: 400.0000 mg | ORAL_CAPSULE | Freq: Every day | ORAL | Status: DC

## 2020-10-22 MED ORDER — DIAZEPAM 5 MG PO TABS
5.0000 mg | ORAL_TABLET | Freq: Four times a day (QID) | ORAL | Status: DC | PRN
  Administered 2020-10-23: 5 mg via ORAL
  Filled 2020-10-22: qty 1

## 2020-10-22 NOTE — Progress Notes (Signed)
RN changed dressing x2 . Dressing saturated and leaking every time patient gets out of bed or go to  bathroom. MD aware. Will continue to change dressing and monitor patient.

## 2020-10-22 NOTE — Evaluation (Signed)
Occupational Therapy Evaluation Patient Details Name: Sherry [name] MRN: [id number] DOB: Feb 29, 1956 Today's Date: 10/22/2020    History of Present Illness 65 y/o female who presents s/p L4-L5 PLIF on 10/21/2020. Extensive PMH significant for but not limited to psych, opioid dependence, tardive dyskinesia 2 drug, HTN, CA, hep C, CKD III.   Clinical Impression   PTA, pt was living alone and was performing BADLs and using SPC; pt has aide who assists with ADLs and IADLs. Pt currently requiring Min A for ADLs and functional mobility with use of AE and RW. Pt presenting with poor activity tolerance impacted by pain, fatigue, and limiting behavior. Pt performing toileting, hand hygiene, and mobility to/from door. Pt would benefit from further acute OT to facilitate safe dc. Recommend dc to SNF for further OT to optimize safety, independence with ADLs, and return to PLOF.     Follow Up Recommendations  SNF    Equipment Recommendations  Other (comment) (Defer to next venue)    Recommendations for Other Services PT consult     Precautions / Restrictions Precautions Precautions: Fall;Back Precaution Booklet Issued: Yes (comment) Precaution Comments: Briefly reviewed precautions - pt not very engaged in education. Required Braces or Orthoses: Spinal Brace Spinal Brace: Lumbar corset;Applied in sitting position Restrictions Weight Bearing Restrictions: No      Mobility Bed Mobility Overal bed mobility: Needs Assistance Bed Mobility: Sidelying to Sit   Sidelying to sit: Min guard       General bed mobility comments: VC's for log roll technique and multimodal cues for safe hand placement to keep from twisting as pt scooted out fully to EOB. Pt already on her side and she was able to elevate trunk to full sitting position without assist.    Transfers Overall transfer level: Needs assistance Equipment used: 1 person hand held assist Transfers: Sit to/from Stand Sit to Stand: Min  assist         General transfer comment: Pt reaching out for therapist's hand to stand up despite cues to push up from bed. Min assist through L HHA.    Balance Overall balance assessment: Needs assistance Sitting-balance support: Feet supported;No upper extremity supported Sitting balance-Leahy Scale: Fair     Standing balance support: Single extremity supported;During functional activity Standing balance-Leahy Scale: Poor Standing balance comment: Reliant on UE support (at least 1 hand)                           ADL either performed or assessed with clinical judgement   ADL Overall ADL's : Needs assistance/impaired Eating/Feeding: Set up;Sitting   Grooming: Supervision/safety;Set up;Standing;Wash/dry hands   Upper Body Bathing: Minimal assistance;Sitting   Lower Body Bathing: Sit to/from stand;Minimal assistance   Upper Body Dressing : Minimal assistance;Sitting Upper Body Dressing Details (indicate cue type and reason): Min A for positioning brace Lower Body Dressing: Minimal assistance;Sit to/from stand;Cueing for compensatory techniques;With adaptive equipment Lower Body Dressing Details (indicate cue type and reason): Providing education on use of sock aide. Pt donning socks while seated at EOB. Min A for balance in standing Toilet Transfer: Minimal assistance;Ambulation;RW;Regular Toilet;Grab bars           Functional mobility during ADLs: Minimal assistance;Rolling walker General ADL Comments: Pt presenting with limited activity tolerance due to pain, fatigue, and engagement     Vision         Perception     Praxis      Pertinent Vitals/Pain Pain Assessment: Faces Faces  Pain Scale: Hurts whole lot Pain Location: Unclear as pt not being specific about location of her pain, but likely back/incision site Pain Descriptors / Indicators: Operative site guarding;Moaning Pain Intervention(s): Monitored during session;Limited activity within patient's  tolerance;Repositioned     Hand Dominance Right   Extremity/Trunk Assessment Upper Extremity Assessment Upper Extremity Assessment: Overall WFL for tasks assessed   Lower Extremity Assessment Lower Extremity Assessment: Defer to PT evaluation   Cervical / Trunk Assessment Cervical / Trunk Assessment: Other exceptions Cervical / Trunk Exceptions: s/p surgery   Communication Communication Communication: No difficulties   Cognition Arousal/Alertness: Lethargic;Suspect due to medications Behavior During Therapy: Flat affect Overall Cognitive Status: Difficult to assess                                 General Comments: Pt presenting with self limiting behaviors and decreased engagement in session   General Comments  Noted significant amount of sanguinous drainage on the bed.    Exercises     Shoulder Instructions      Home Living Family/patient expects to be discharged to:: Private residence Living Arrangements: Alone Available Help at Discharge: Personal care attendant (Pt reports she has family in the area (sister who does not work assists with groceries), and PCA M-F 2 hours a day, however pt is insistent she has no one to assist her upon d/c.) Type of Home: House Home Access: Stairs to enter [employer] of Steps: 4 Entrance Stairs-Rails: Right;Left Home Layout: One level     Bathroom Shower/Tub: Teacher, early years/pre: [place]: Cane - single point;Walker - 2 wheels;Shower seat          Prior Functioning/Environment Level of Independence: Needs assistance  Gait / Transfers Assistance Needed: SPC use PTA. ADL's / Homemaking Assistance Needed: PCA assists with ADL's, IADL's including bathing, dressing, meal prep. Sister assists with driving to grocery store            OT Problem List: Decreased strength;Decreased range of motion;Decreased activity tolerance;Impaired balance (sitting and/or  standing);Decreased cognition;Decreased safety awareness;Decreased knowledge of use of DME or AE;Decreased knowledge of precautions;Pain      OT Treatment/Interventions: Self-care/ADL training;Therapeutic exercise;Energy conservation;DME and/or AE instruction;Therapeutic activities;Patient/family education;Balance training    OT Goals(Current goals can be found in the care plan section) Acute Rehab OT Goals Patient Stated Goal: Go to rehab OT Goal Formulation: With patient Time For Goal Achievement: 11/05/20 Potential to Achieve Goals: Good ADL Goals Pt Will Perform Upper Body Dressing: with modified independence;sitting Pt Will Perform Lower Body Dressing: with modified independence;with adaptive equipment;sit to/from stand Pt Will Transfer to Toilet: with modified independence;ambulating;regular height toilet Pt Will Perform Toileting - Clothing Manipulation and hygiene: with modified independence;sit to/from stand;sitting/lateral leans Additional ADL Goal #1: Pt will perform bed mobility using log roll technique with Supervision in preparation for ADLs  OT Frequency: Min 2X/week   Barriers to D/C:            Co-evaluation              AM-PAC OT "6 Clicks" Daily Activity     Outcome Measure Help from another person eating meals?: None Help from another person taking care of personal grooming?: A Little Help from another person toileting, which includes using toliet, bedpan, or urinal?: A Little Help from another person bathing (including washing, rinsing, drying)?: A Little Help from another person to  put on and taking off regular upper body clothing?: A Little Help from another person to put on and taking off regular lower body clothing?: A Little 6 Click Score: 19   End of Session Equipment Utilized During Treatment: Back brace;Rolling walker Nurse Communication: Mobility status (significnat drainage from sx site)  Activity Tolerance: Patient limited by fatigue;Patient  limited by lethargy Patient left: in chair;with call bell/phone within reach;with nursing/sitter in room  OT Visit Diagnosis: Unsteadiness on feet (R26.81);Other abnormalities of gait and mobility (R26.89);Muscle weakness (generalized) (M62.81);Pain Pain - part of body:  (Back)                Time: 4388-8757 OT Time Calculation (min): 14 min Charges:  OT General Charges $OT Visit: 1 Visit OT Evaluation $OT Eval Moderate Complexity: [place], OTR/L Acute Rehab Pager: [phone] Office: [place] 10/22/2020, 9:43 AM

## 2020-10-22 NOTE — Progress Notes (Signed)
Inpatient Rehab Admissions Coordinator:   CIR consult received.  Note therapies recommending SNF at this time, so would not be able to pursue CIR with [facility]. Will sign off for CIR.    Shann Medal, PT, DPT Admissions Coordinator [phone] 10/22/20  11:03 AM

## 2020-10-22 NOTE — Consult Note (Signed)
   [facility] CM Inpatient Consult   10/22/2020  Sherry [name] 03/11/1956 [id number]   Patient chart reviewed for potential [facility] CM) services due to high unplanned readmission risk score.   Per chart review, current recommendation is for skilled nursing facility. No [facility] CM needs at this time.  Of note, [facility] Care Management services does not replace or interfere with any services that are arranged by inpatient case management or social work.  Netta Cedars, MSN, [facility] Liaison Nurse Mobile Phone [phone]  Toll free office [phone]

## 2020-10-22 NOTE — Progress Notes (Addendum)
Subjective: The patient is alert and pleasant.  Her back is appropriately sore.  She requests Valium for muscle spasms.  She is interested in rehab.  Objective: Vital signs in last 24 hours: Temp:  [97.7 F (36.5 C)-98.5 F (36.9 C)] 98 F (36.7 C) (04/12 0320) Pulse Rate:  [66-89] 89 (04/12 0320) Resp:  [11-23] 20 (04/12 0320) BP: (127-174)/(56-79) 154/75 (04/12 0320) SpO2:  [90 %-100 %] 97 % (04/12 0320) Weight:  [100.7 kg] 100.7 kg (04/11 0854) Estimated body mass index is 40.6 kg/m as calculated from the following:   Height as of this encounter: 5\' 2"  (1.575 m).   Weight as of this encounter: 100.7 kg.   Intake/Output from previous day: 04/11 0701 - 04/12 0700 In: 1000 [I.V.:1000] Out: 1220 [Urine:1020; Blood:200] Intake/Output this shift: No intake/output data recorded.  Physical exam the patient is alert and pleasant.  Her lower extremity strength is normal.  Her dressing is clean and dry but has been reinforced.  We are awaiting the results of her BMP and CBC.  Lab Results: No results for input(s): WBC, HGB, HCT, PLT in the last 72 hours. BMET Recent Labs    10/21/20 0937  NA 138  K 4.6  CL 107  CO2 20*  GLUCOSE 108*  BUN 39*  CREATININE 3.58*  CALCIUM 9.1    Studies/Results: DG Lumbar Spine 2-3 Views  Result Date: 10/21/2020 CLINICAL DATA:  L4-L5 PLIF. EXAM: LUMBAR SPINE - 2-3 VIEW; DG C-ARM 1-60 MIN COMPARISON:  Lumbar MRI 07/02/2020 indicating 5 lumbar vertebra. FINDINGS: Two fluoroscopic spot views of the lumbar spine obtained in the operating room in frontal and lateral projection. Intrapedicular screws at L4 and L5 with interbody spacer. Total fluoroscopy time 28 seconds. Total dose 24.29 mGy. IMPRESSION: Intraoperative fluoroscopy during L4-L5 fusion. Electronically Signed   By: Keith Rake M.D.   On: 10/21/2020 16:38   DG C-Arm 1-60 Min  Result Date: 10/21/2020 CLINICAL DATA:  L4-L5 PLIF. EXAM: LUMBAR SPINE - 2-3 VIEW; DG C-ARM 1-60 MIN  COMPARISON:  Lumbar MRI 07/02/2020 indicating 5 lumbar vertebra. FINDINGS: Two fluoroscopic spot views of the lumbar spine obtained in the operating room in frontal and lateral projection. Intrapedicular screws at L4 and L5 with interbody spacer. Total fluoroscopy time 28 seconds. Total dose 24.29 mGy. IMPRESSION: Intraoperative fluoroscopy during L4-L5 fusion. Electronically Signed   By: Keith Rake M.D.   On: 10/21/2020 16:38    Assessment/Plan: Postop day #1: We will mobilize the patient with PT and OT.  I will ask rehab to see the patient.  Wound drainage: I will continue her vancomycin until her wound drainage stops.  LOS: 1 day     Ophelia Charter 10/22/2020, 7:23 AM

## 2020-10-22 NOTE — Progress Notes (Cosign Needed)
       RE:   Sherry [name]    Date of Birth:  1955/08/22    Date:   10/22/2020      To Whom It May Concern:  Please be advised that the above-named patient will require a short-term nursing home stay - anticipated 30 days or less for rehabilitation and strengthening.  The plan is for return home.

## 2020-10-22 NOTE — Progress Notes (Addendum)
Pharmacy Antibiotic Note  Sherry [name] is a 65 y.o. female admitted on 10/21/2020 with wound drainage.  Pharmacy has been consulted for vancomycin dosing.   Unclear if patient got one or two doses of vancomycin 1500mg  on 4/11. ClCr is very poor ~15 ml/min and patient is a high risk of AKI on CKD. No WBC today. Afebrile. Per RN, wound is bleeding in the setting of high NSAID use preop, it is not erythematous or serous or purulent.   Plan:  Start vancomycin 500mg  Q24hr Monitor cultures, clinical status, renal fx, vanc levels  Narrow abx as able and f/u duration    Height: 5\' 2"  (157.5 cm) Weight: 100.7 kg (222 lb) IBW/kg (Calculated) : 50.1  Temp (24hrs), Avg:98.2 F (36.8 C), Min:97.7 F (36.5 C), Max:98.5 F (36.9 C)  Recent Labs  Lab 10/17/20 1428 10/21/20 0937  WBC 6.7  --   CREATININE 3.27* 3.58*    Estimated Creatinine Clearance: 17.6 mL/min (A) (by C-G formula based on SCr of 3.58 mg/dL (H)).    Allergies  Allergen Reactions  . Abilify [Aripiprazole] Other (See Comments)    Tardive dyskinesia Oral  . Remeron [Mirtazapine] Other (See Comments)    Wgt stimulation /gain, Dizziness, Patient says "can tolerate"  . Trazodone And Nefazodone Other (See Comments)    Nightmares/sleep diturbance  . Flexeril [Cyclobenzaprine] Other (See Comments)    Pt states Flexeril makes her feel depressed   . Amoxicillin Diarrhea and Other (See Comments)    NOTE the patient has had PCN WITHOUT reaction Has patient had a PCN reaction causing immediate rash, facial/tongue/throat swelling, SOB or lightheadedness with hypotension: No Has patient had a PCN reaction causing severe rash involving mucus membranes or skin necrosis: No Has patient had a PCN reaction that required hospitalization: No Has patient had a PCN reaction occurring within the last 10 years: No If all of the above answers are "NO", then may proceed with Cephalosporin use.     Antimicrobials this admission: vanc 4/11 >>     Microbiology results: 4/7 MRSA PCR: negative  Thank you for allowing pharmacy to be a part of this patient's care.  Benetta Spar, PharmD, BCPS, BCCP Clinical Pharmacist  Please check AMION for all [place] phone numbers After 10:00 PM, call [place] [phone]

## 2020-10-22 NOTE — TOC Progression Note (Addendum)
Transition of Care [facility]) - Progression Note    Patient Details  Name: Leilana Mcquire MRN: [id number] Date of Birth: 1955/10/04  Transition of Care [facility]) CM/SW [place], [place] Phone Number: 10/22/2020, 2:17 PM  Clinical Narrative:     CSW met with pt, pt sister, and pt friend to discuss bed offer. CSW explained that [facility] is the only facility to extend bed offer. CSW explained that [facility] in [place] is also review. Pt explained she did not want to go to [facility]. She chooses [facility]. CSW explained insurance auth process. Covid test was already requested and collected.   CSW confirmed plan with [facility]. [facility] would need pt to get her Covid booster vaccine. Pt is agreeable to this. Covid booster vaccine requested. Pt's insurance is managed directly by [facility]; [facility] will start auth.   Expected Discharge Plan: [place] Barriers to Discharge: Continued Medical Work up  Expected Discharge Plan and Services Expected Discharge Plan: [place] arrangements for the past 2 months: Single Family Home                                       Social Determinants of Health (SDOH) Interventions    Readmission Risk Interventions No flowsheet data found.

## 2020-10-22 NOTE — TOC Initial Note (Signed)
Transition of Care [facility]) - Initial/Assessment Note    Patient Details  Name: Sherry [name] MRN: [id number] Date of Birth: [birth year]  Transition [facility] Care [facility]) CM/SW Contact:    Bethann [place], [place] Phone Number: 10/22/2020, 9:56 AM  Clinical Narrative:                  CSW met with pt to discuss SNF recommendation. Pt is interested in rehab at [facility]. She lives alone in [place] but has family members nearby. Pt is agreeable to SNF workup at this time for [facility] and [place] areas. Pt reports she is vaccinated x2 but did not receive booster. CSW to complete FL2 and fax bed requests. PASSR pending; will need signed 30 daynote and clinicals uploaded to NCMUST.   Expected Discharge Plan: Skilled Nursing Facility Barriers to Discharge: Continued Medical Work up   Patient Goals and CMS Choice Patient states their goals for this hospitalization and ongoing recovery are:: Rehab at [facility] CMS Medicare.gov Compare Post Acute Care list provided to:: Patient Choice offered to / list presented to : Patient  Expected Discharge Plan and Services Expected Discharge Plan: [place] arrangements for the past 2 months: Single Family Home                                      Prior Living Arrangements/Services Living arrangements for the past 2 months: Single Family Home Lives with:: Self          Need for Family Participation in Patient Care: No (Comment) Care giver support system in place?: No (comment)      Activities of Daily Living      Permission Sought/Granted                  Emotional Assessment Appearance:: Appears stated age Attitude/Demeanor/Rapport: Engaged Affect (typically observed): Accepting Orientation: : Oriented to Self,Oriented to Place,Oriented to  Time,Oriented to Situation Alcohol / Substance Use: Not Applicable Psych Involvement: No (comment)  Admission diagnosis:  Spondylolisthesis, lumbar region  [M43.16] Patient Active Problem List   Diagnosis Date Noted  . Spondylolisthesis, lumbar region 10/21/2020  . Anxiety   . Bipolar and related disorder ([place])   . Blood transfusion without reported diagnosis   . Breast cancer ([place])   . CHF (congestive heart failure) ([place])   . Chronic back pain   . Chronic kidney disease   . Depression   . Dyspnea   . Hepatitis C   . MRSA infection   . Opioid dependence ([place])   . Substance abuse ([place])   . Chest pain 05/31/2020  . Near syncope 05/29/2020  . Chest pain, rule out acute myocardial infarction 05/29/2020  . CKD (chronic kidney disease) stage 4, GFR 15-29 ml/min (HCC) 04/18/2020  . ARF (acute renal failure) ([place]) 04/09/2020  . Nausea 04/09/2020  . Chronic diastolic CHF (congestive heart failure) ([place]) 05/09/2019  . GERD (gastroesophageal reflux disease) 05/09/2019  . Alcohol abuse 05/09/2019  . Hypertension 05/08/2019  . Bilateral low back pain with bilateral sciatica 01/05/2019  . TIA (transient ischemic attack) 2020  . Cyst of ovary   . Pre-operative cardiovascular examination 06/13/2018  . Dizziness 02/24/2018  . Left-sided weakness 02/24/2018  . Cigarette smoker 12/31/2017  . DOE (dyspnea on exertion) 12/30/2017  . Phlebitis after infusion 10/28/2017  . Hypertensive emergency 10/21/2017  . Microcytic anemia 10/21/2017  .  Medication intolerance 09/06/2017  . Neuroleptic-induced tardive dyskinesia 09/06/2017  . Cancer ([place]) 09/06/2017  . Chronic low back pain 09/06/2017  . Grief reaction with prolonged bereavement 09/06/2017  . Opioid use disorder, severe, dependence ([place]) 08/27/2017  . Alcohol use disorder, severe, dependence ([place]) 08/27/2017  . Substance induced mood disorder ([place]) 08/13/2017  . MDD (major depressive disorder), recurrent severe, without psychosis ([place])   . Alcohol withdrawal ([place]) 08/09/2017  . Essential hypertension 07/06/2017  . Chronic kidney disease, stage III (moderate) ([place]) 11/17/2016  . Depression  with anxiety 08/21/2016  . Hx of bipolar disorder 01/31/2016  . Oral aphthous ulcer 06/25/2015  . History of breast cancer 02/13/2013  . Morbid obesity due to excess calories ([place]) 02/13/2013  . Restless legs syndrome 02/13/2013  . Subacute dyskinesia due to drug 02/13/2013  . Dyspnea and respiratory abnormality 02/13/2013  . Hepatitis C virus infection cured after antiviral drug therapy 12/17/2012   PCP:  Wendie Agreste, MD Pharmacy:   [id number] - [place], [place] - [address] AT [place] [address] [place] [address] Phone: [phone] Fax: [place], [place] [address] [place] [address] Phone: [phone] Fax: [phone]     Social Determinants of Health (SDOH) Interventions    Readmission Risk Interventions No flowsheet data found.

## 2020-10-22 NOTE — Evaluation (Signed)
Physical Therapy Evaluation Patient Details Name: Sherry [name] MRN: [id number] DOB: 06-26-1956 Today's Date: 10/22/2020   History of Present Illness  Pt is a 65 y/o female who presents s/p L4-L5 PLIF on 10/21/2020. Extensive PMH significant for but not limited to psych, opioid dependence, tardive dyskinesia 2 drug, HTN, CA, hep C, CKD III.  Clinical Impression  Pt admitted with above diagnosis. At the time of PT eval, pt was able to demonstrate transfers and ambulation with gross min guard assist and RW for support. Pt was educated on precautions, brace application/wearing schedule and general safety. Overall appears to be self-limiting, as she did fairly well ambulating around the room. Pt requesting SNF level rehab services at d/c. Pt currently with functional limitations due to the deficits listed below (see PT Problem List). Pt will benefit from skilled PT to increase their independence and safety with mobility to allow discharge to the venue listed below.      Follow Up Recommendations SNF;Supervision/Assistance - 24 hour    Equipment Recommendations  None recommended by PT    Recommendations for Other Services       Precautions / Restrictions Precautions Precautions: Fall;Back Precaution Booklet Issued: Yes (comment) Precaution Comments: Briefly reviewed precautions - pt not very engaged in education. Required Braces or Orthoses: Spinal Brace Spinal Brace: Lumbar corset;Applied in sitting position Restrictions Weight Bearing Restrictions: No      Mobility  Bed Mobility Overal bed mobility: Needs Assistance Bed Mobility: Sidelying to Sit   Sidelying to sit: Min guard       General bed mobility comments: VC's for log roll technique and multimodal cues for safe hand placement to keep from twisting as pt scooted out fully to EOB. Pt already on her side and she was able to elevate trunk to full sitting position without assist.    Transfers Overall transfer level: Needs  assistance Equipment used: 1 person hand held assist Transfers: Sit to/from Stand Sit to Stand: Min assist         General transfer comment: Pt reaching out for therapist's hand to stand up despite cues to push up from bed. Min assist through L HHA.  Ambulation/Gait Ambulation/Gait assistance: Min guard Gait Distance (Feet): 25 Feet Assistive device: Rolling walker (2 wheeled);1 person hand held assist Gait Pattern/deviations: Step-through pattern;Decreased stride length Gait velocity: Decreased Gait velocity interpretation: <1.31 ft/sec, indicative of household ambulator General Gait Details: Slow and guarded - pt with eyes closed a significant amount of time between ambulation to bathroom and then around the room to the chair. Initially with HHA and then RW provided after bathroom. Min guard assist throughout, mainly for safety as eyes closed.  Stairs            Wheelchair Mobility    Modified Rankin (Stroke Patients Only)       Balance Overall balance assessment: Needs assistance Sitting-balance support: Feet supported;No upper extremity supported Sitting balance-Leahy Scale: Fair     Standing balance support: Single extremity supported;During functional activity Standing balance-Leahy Scale: Poor Standing balance comment: Reliant on UE support (at least 1 hand)                             Pertinent Vitals/Pain Pain Assessment: Faces Faces Pain Scale: Hurts whole lot Pain Location: Unclear as pt not being specific about location of her pain, but likely back/incision site Pain Descriptors / Indicators: Operative site guarding;Moaning Pain Intervention(s): Limited activity within patient's tolerance;Monitored during session;Repositioned  Home Living Family/patient expects to be discharged to:: Private residence Living Arrangements: Alone Available Help at Discharge: Personal care attendant (Pt reports she has family in the area (sister who does not  work assists with groceries), and PCA M-F 2 hours a day, however pt is insistent she has no one to assist her upon d/c.) Type of Home: House Home Access: Stairs to enter Entrance Stairs-Rails: Psychiatric nurse of Steps: [place]: One level [place]: [place] - single point;Walker - 2 wheels;Shower seat      Prior Function Level of Independence: Needs assistance   Gait / Transfers Assistance Needed: SPC use PTA.  ADL's / Homemaking Assistance Needed: PCA assists with ADL's, IADL's including bathing, dressing, meal prep        Hand Dominance   Dominant Hand: Right    Extremity/Trunk Assessment   Upper Extremity Assessment Upper Extremity Assessment: Defer to OT evaluation    Lower Extremity Assessment Lower Extremity Assessment: Generalized weakness (Mild; Consistent with pre-op diagnosis)    Cervical / Trunk Assessment Cervical / Trunk Assessment: Other exceptions Cervical / Trunk Exceptions: s/p surgery  Communication   Communication: No difficulties  Cognition Arousal/Alertness: Lethargic;Suspect due to medications Behavior During Therapy: Flat affect Overall Cognitive Status: Difficult to assess                                        General Comments General comments (skin integrity, edema, etc.): Noted significant amount of sanguinous drainage on the bed.    Exercises     Assessment/Plan    PT Assessment Patient needs continued PT services  PT Problem List Decreased strength;Decreased activity tolerance;Decreased balance;Decreased mobility;Decreased knowledge of use of DME;Decreased safety awareness;Decreased knowledge of precautions;Pain       PT Treatment Interventions DME instruction;Gait training;Functional mobility training;Stair training;Therapeutic activities;Therapeutic exercise;Neuromuscular re-education;Patient/family education    PT Goals (Current goals can be found in the Care Plan section)  Acute Rehab  PT Goals Patient Stated Goal: Go to rehab PT Goal Formulation: With patient Time For Goal Achievement: 11/05/20 Potential to Achieve Goals: Good    Frequency Min 5X/week   Barriers to discharge        Co-evaluation               AM-PAC PT "6 Clicks" Mobility  Outcome Measure Help needed turning from your back to your side while in a flat bed without using bedrails?: None Help needed moving from lying on your back to sitting on the side of a flat bed without using bedrails?: A Little Help needed moving to and from a bed to a chair (including a wheelchair)?: A Little Help needed standing up from a chair using your arms (e.g., wheelchair or bedside chair)?: A Little Help needed to walk in hospital room?: A Little Help needed climbing 3-5 steps with a railing? : A Little 6 Click Score: 19    End of Session Equipment Utilized During Treatment: Back brace Activity Tolerance: Patient limited by fatigue;Patient limited by lethargy;Patient limited by pain Patient left: in chair;with call bell/phone within reach;with nursing/sitter in room Nurse Communication: Mobility status PT Visit Diagnosis: Unsteadiness on feet (R26.81);Pain Pain - part of body:  (back)    Time: 5188-4166 PT Time Calculation (min) (ACUTE ONLY): 18 min   Charges:   PT Evaluation $PT Eval Low Complexity: 1 Low          Mickel Baas  Rich Reining, PT, DPT Acute Rehabilitation Services Pager: [phone] Office: [phone]   [name] 10/22/2020, 8:59 AM

## 2020-10-22 NOTE — NC FL2 (Signed)
[place] LEVEL OF CARE SCREENING TOOL     IDENTIFICATION  Patient Name: Sherry [name] Birthdate: [birth year] Sex: female Admission Date (Current Location): 10/21/2020  [facility] and [place] Number:  Herbalist and Address:  [facility] [place], [place] [address] [place], [place] [address]      Provider Number: [id number]  Attending Physician Name and Address:  Newman Pies, MD  Relative Name and Phone Number:  Donzetta Kohut (Sister)   [phone] [facility])    Current Level of Care: Hospital Recommended Level of Care: [place] Prior Approval Number:    Date Approved/Denied:   PASRR Number: Pending  Discharge Plan: SNF    Current Diagnoses: Patient Active Problem List   Diagnosis Date Noted  . Spondylolisthesis, lumbar region 10/21/2020  . Anxiety   . Bipolar and related disorder ([place])   . Blood transfusion without reported diagnosis   . Breast cancer ([place])   . CHF (congestive heart failure) ([place])   . Chronic back pain   . Chronic kidney disease   . Depression   . Dyspnea   . Hepatitis C   . MRSA infection   . Opioid dependence ([place])   . Substance abuse ([place])   . Chest pain 05/31/2020  . Near syncope 05/29/2020  . Chest pain, rule out acute myocardial infarction 05/29/2020  . CKD (chronic kidney disease) stage 4, GFR 15-29 ml/min (HCC) 04/18/2020  . ARF (acute renal failure) ([place]) 04/09/2020  . Nausea 04/09/2020  . Chronic diastolic CHF (congestive heart failure) ([place]) 05/09/2019  . GERD (gastroesophageal reflux disease) 05/09/2019  . Alcohol abuse 05/09/2019  . Hypertension 05/08/2019  . Bilateral low back pain with bilateral sciatica 01/05/2019  . TIA (transient ischemic attack) 2020  . Cyst of ovary   . Pre-operative cardiovascular examination 06/13/2018  . Dizziness 02/24/2018  . Left-sided weakness 02/24/2018  . Cigarette smoker 12/31/2017  . DOE (dyspnea on exertion) 12/30/2017  . Phlebitis after  infusion 10/28/2017  . Hypertensive emergency 10/21/2017  . Microcytic anemia 10/21/2017  . Medication intolerance 09/06/2017  . Neuroleptic-induced tardive dyskinesia 09/06/2017  . Cancer ([place]) 09/06/2017  . Chronic low back pain 09/06/2017  . Grief reaction with prolonged bereavement 09/06/2017  . Opioid use disorder, severe, dependence ([place]) 08/27/2017  . Alcohol use disorder, severe, dependence ([place]) 08/27/2017  . Substance induced mood disorder ([place]) 08/13/2017  . MDD (major depressive disorder), recurrent severe, without psychosis ([place])   . Alcohol withdrawal ([place]) 08/09/2017  . Essential hypertension 07/06/2017  . Chronic kidney disease, stage III (moderate) ([place]) 11/17/2016  . Depression with anxiety 08/21/2016  . Hx of bipolar disorder 01/31/2016  . Oral aphthous ulcer 06/25/2015  . History of breast cancer 02/13/2013  . Morbid obesity due to excess calories ([place]) 02/13/2013  . Restless legs syndrome 02/13/2013  . Subacute dyskinesia due to drug 02/13/2013  . Dyspnea and respiratory abnormality 02/13/2013  . Hepatitis C virus infection cured after antiviral drug therapy 12/17/2012    Orientation RESPIRATION BLADDER Height & Weight     Self,Time,Situation,Place  Normal External catheter Weight: 222 lb (100.7 kg) Height:  5\' 2"  (157.5 cm)  BEHAVIORAL SYMPTOMS/MOOD NEUROLOGICAL BOWEL NUTRITION STATUS      Continent Diet (See d/c summary)  AMBULATORY STATUS COMMUNICATION OF NEEDS Skin   Limited Assist Verbally Surgical wounds (Incision; back)                       Personal Care Assistance Level of Assistance  Bathing,Feeding,Dressing Bathing Assistance: Limited assistance Feeding assistance: Independent Dressing Assistance: Limited assistance     Functional Limitations Info  Sight,Hearing,Speech Sight Info: Adequate Hearing Info: Adequate Speech Info: Adequate    SPECIAL CARE FACTORS FREQUENCY  PT (By licensed PT),OT (By licensed OT)     PT Frequency:  5x/week OT Frequency: 5x/week            Contractures Contractures Info: Not present    Additional Factors Info  Code Status,Allergies Code Status Info: Full code Allergies Info: Abilify (aripiprazole), Remeron (mirtazapine), amoxicillin, Flexeril (cyclobenzaprine, Trazodone And Nefazodone           Current Medications (10/22/2020):  This is the current hospital active medication list Current Facility-Administered Medications  Medication Dose Route Frequency Provider Last Rate Last Admin  . 0.9 %  sodium chloride infusion   Intravenous Continuous Roderic Palau, MD 10 mL/hr at 10/21/20 0940 New Bag at 10/21/20 0940  . 0.9 %  sodium chloride infusion  250 mL Intravenous Continuous Newman Pies, MD      . Derrill Memo ON 10/23/2020] acetaminophen (TYLENOL) tablet 650 mg  650 mg Oral Q4H PRN Newman Pies, MD       Or  . Derrill Memo ON 10/23/2020] acetaminophen (TYLENOL) suppository 650 mg  650 mg Rectal Q4H PRN Newman Pies, MD      . acetaminophen (TYLENOL) tablet 1,000 mg  1,000 mg Oral Q6H Newman Pies, MD   1,000 mg at 10/22/20 0649  . amLODipine (NORVASC) tablet 10 mg  10 mg Oral q AM Newman Pies, MD   10 mg at 10/22/20 0936  . bisacodyl (DULCOLAX) suppository 10 mg  10 mg Rectal Daily PRN Newman Pies, MD      . cholecalciferol (VITAMIN D) tablet 2,000 Units  2,000 Units Oral q AM Newman Pies, MD   2,000 Units at 10/22/20 0931  . diazepam (VALIUM) tablet 5 mg  5 mg Oral Q6H PRN Newman Pies, MD      . docusate sodium (COLACE) capsule 100 mg  100 mg Oral BID Newman Pies, MD   100 mg at 10/22/20 0933  . ferrous sulfate tablet 325 mg  325 mg Oral Q breakfast Newman Pies, MD   325 mg at 10/22/20 0931  . FLUoxetine (PROZAC) capsule 40 mg  40 mg Oral q AM Newman Pies, MD   40 mg at 10/22/20 0932  . gabapentin (NEURONTIN) capsule 800 mg  800 mg Oral QHS Donnamae Jude, [facility]      . hydrALAZINE (APRESOLINE) tablet 100 mg  100 mg Oral TID Newman Pies, MD   100 mg at 10/22/20 0932  . HYDROcodone-acetaminophen (NORCO) 10-325 MG per tablet 1-2 tablet  1-2 tablet Oral Q4H PRN Newman Pies, MD   2 tablet at 10/22/20 0933  . isosorbide mononitrate (IMDUR) 24 hr tablet 60 mg  60 mg Oral q AM Newman Pies, MD   60 mg at 10/22/20 0932  . labetalol (NORMODYNE) tablet 300 mg  300 mg Oral BID Newman Pies, MD   300 mg at 10/22/20 0932  . menthol-cetylpyridinium (CEPACOL) lozenge 3 mg  1 lozenge Oral PRN Newman Pies, MD       Or  . phenol [facility]) mouth spray 1 spray  1 spray Mouth/Throat PRN Newman Pies, MD      . methocarbamol (ROBAXIN) tablet 500-1,000 mg  500-1,000 mg Oral Q6H PRN Newman Pies, MD   500 mg at 10/22/20 0933  . morphine 4 MG/ML injection 4 mg  4 mg Intravenous Q2H  PRN Newman Pies, MD   4 mg at 10/22/20 0335  . ondansetron (ZOFRAN) tablet 4 mg  4 mg Oral Q6H PRN Newman Pies, MD       Or  . ondansetron [facility]) injection 4 mg  4 mg Intravenous Q6H PRN Newman Pies, MD      . oxyCODONE (Oxy IR/ROXICODONE) immediate release tablet 10 mg  10 mg Oral Q3H PRN Newman Pies, MD   10 mg at 10/22/20 9458  . oxyCODONE (Oxy IR/ROXICODONE) immediate release tablet 5 mg  5 mg Oral Q3H PRN Newman Pies, MD      . pantoprazole (PROTONIX) EC tablet 20 mg  20 mg Oral BID Newman Pies, MD   20 mg at 10/22/20 0931  . sodium bicarbonate tablet 650 mg  650 mg Oral BID Newman Pies, MD   650 mg at 10/22/20 0932  . sodium chloride flush (NS) 0.9 % injection 3 mL  3 mL Intravenous Q12H Newman Pies, MD   3 mL at 10/22/20 0929  . sodium chloride flush (NS) 0.9 % injection 3 mL  3 mL Intravenous PRN Newman Pies, MD      . valbenazine [facility]) capsule 80 mg  80 mg Oral QHS Newman Pies, MD   80 mg at 10/21/20 2218  . vancomycin (VANCOREADY) IVPB 500 mg/100 mL  500 mg Intravenous Q24H Donnamae Jude, RPH      . zolpidem [facility]) tablet 5 mg  5 mg Oral QHS PRN Newman Pies, MD          Discharge Medications: Please see discharge summary for a list of discharge medications.  Relevant Imaging Results:  Relevant Lab Results:   Additional Information SSN [id number]; Covid Vaccinated x2  [place], [place]

## 2020-10-22 NOTE — Progress Notes (Signed)
Orthopedic Tech Progress Note Patient Details:  Sherry [name] Dec 10, 1955 [id number]  Ortho Devices Type of Ortho Device: Lumbar corsett Ortho Device/Splint Location: delivered to RN Ortho Device/Splint Interventions: Ordered  I told the RN that if they needed me to come back to call     Karolee Stamps 10/22/2020, 12:06 AM

## 2020-10-23 DIAGNOSIS — I5032 Chronic diastolic (congestive) heart failure: Secondary | ICD-10-CM | POA: Diagnosis not present

## 2020-10-23 DIAGNOSIS — Z981 Arthrodesis status: Secondary | ICD-10-CM | POA: Diagnosis not present

## 2020-10-23 DIAGNOSIS — F1190 Opioid use, unspecified, uncomplicated: Secondary | ICD-10-CM | POA: Diagnosis not present

## 2020-10-23 DIAGNOSIS — F1120 Opioid dependence, uncomplicated: Secondary | ICD-10-CM | POA: Diagnosis not present

## 2020-10-23 DIAGNOSIS — R110 Nausea: Secondary | ICD-10-CM | POA: Diagnosis not present

## 2020-10-23 DIAGNOSIS — R0602 Shortness of breath: Secondary | ICD-10-CM | POA: Diagnosis not present

## 2020-10-23 DIAGNOSIS — R41841 Cognitive communication deficit: Secondary | ICD-10-CM | POA: Diagnosis not present

## 2020-10-23 DIAGNOSIS — M545 Low back pain, unspecified: Secondary | ICD-10-CM | POA: Diagnosis not present

## 2020-10-23 DIAGNOSIS — R262 Difficulty in walking, not elsewhere classified: Secondary | ICD-10-CM | POA: Diagnosis not present

## 2020-10-23 DIAGNOSIS — R1013 Epigastric pain: Secondary | ICD-10-CM | POA: Diagnosis not present

## 2020-10-23 DIAGNOSIS — R5381 Other malaise: Secondary | ICD-10-CM | POA: Diagnosis not present

## 2020-10-23 DIAGNOSIS — Z7401 Bed confinement status: Secondary | ICD-10-CM | POA: Diagnosis not present

## 2020-10-23 DIAGNOSIS — G8929 Other chronic pain: Secondary | ICD-10-CM | POA: Diagnosis not present

## 2020-10-23 DIAGNOSIS — F419 Anxiety disorder, unspecified: Secondary | ICD-10-CM | POA: Diagnosis not present

## 2020-10-23 DIAGNOSIS — M4326 Fusion of spine, lumbar region: Secondary | ICD-10-CM | POA: Diagnosis not present

## 2020-10-23 DIAGNOSIS — R0981 Nasal congestion: Secondary | ICD-10-CM | POA: Diagnosis not present

## 2020-10-23 DIAGNOSIS — J069 Acute upper respiratory infection, unspecified: Secondary | ICD-10-CM | POA: Diagnosis not present

## 2020-10-23 DIAGNOSIS — G4700 Insomnia, unspecified: Secondary | ICD-10-CM | POA: Diagnosis not present

## 2020-10-23 DIAGNOSIS — F17213 Nicotine dependence, cigarettes, with withdrawal: Secondary | ICD-10-CM | POA: Diagnosis not present

## 2020-10-23 DIAGNOSIS — M79651 Pain in right thigh: Secondary | ICD-10-CM | POA: Diagnosis not present

## 2020-10-23 DIAGNOSIS — F332 Major depressive disorder, recurrent severe without psychotic features: Secondary | ICD-10-CM | POA: Diagnosis not present

## 2020-10-23 DIAGNOSIS — I499 Cardiac arrhythmia, unspecified: Secondary | ICD-10-CM | POA: Diagnosis not present

## 2020-10-23 DIAGNOSIS — M549 Dorsalgia, unspecified: Secondary | ICD-10-CM | POA: Diagnosis not present

## 2020-10-23 DIAGNOSIS — K219 Gastro-esophageal reflux disease without esophagitis: Secondary | ICD-10-CM | POA: Diagnosis not present

## 2020-10-23 DIAGNOSIS — R0989 Other specified symptoms and signs involving the circulatory and respiratory systems: Secondary | ICD-10-CM | POA: Diagnosis not present

## 2020-10-23 DIAGNOSIS — G894 Chronic pain syndrome: Secondary | ICD-10-CM | POA: Diagnosis not present

## 2020-10-23 DIAGNOSIS — F319 Bipolar disorder, unspecified: Secondary | ICD-10-CM | POA: Diagnosis not present

## 2020-10-23 DIAGNOSIS — M6281 Muscle weakness (generalized): Secondary | ICD-10-CM | POA: Diagnosis not present

## 2020-10-23 DIAGNOSIS — I10 Essential (primary) hypertension: Secondary | ICD-10-CM | POA: Diagnosis not present

## 2020-10-23 DIAGNOSIS — R059 Cough, unspecified: Secondary | ICD-10-CM | POA: Diagnosis not present

## 2020-10-23 DIAGNOSIS — M5440 Lumbago with sciatica, unspecified side: Secondary | ICD-10-CM | POA: Diagnosis not present

## 2020-10-23 DIAGNOSIS — M4316 Spondylolisthesis, lumbar region: Secondary | ICD-10-CM | POA: Diagnosis not present

## 2020-10-23 DIAGNOSIS — R2681 Unsteadiness on feet: Secondary | ICD-10-CM | POA: Diagnosis not present

## 2020-10-23 DIAGNOSIS — K5909 Other constipation: Secondary | ICD-10-CM | POA: Diagnosis not present

## 2020-10-23 DIAGNOSIS — M2550 Pain in unspecified joint: Secondary | ICD-10-CM | POA: Diagnosis not present

## 2020-10-23 DIAGNOSIS — N184 Chronic kidney disease, stage 4 (severe): Secondary | ICD-10-CM | POA: Diagnosis not present

## 2020-10-23 MED ORDER — METHOCARBAMOL 500 MG PO TABS: 500.0000 mg | ORAL_TABLET | Freq: Four times a day (QID) | ORAL | Status: DC | PRN

## 2020-10-23 MED ORDER — DOCUSATE SODIUM 100 MG PO CAPS: 100.0000 mg | ORAL_CAPSULE | Freq: Two times a day (BID) | ORAL | 0 refills | Status: DC

## 2020-10-23 MED ORDER — OXYCODONE HCL 10 MG PO TABS: 10.0000 mg | ORAL_TABLET | ORAL | 0 refills | Status: DC | PRN

## 2020-10-23 MED ORDER — GABAPENTIN 400 MG PO CAPS: 800.0000 mg | ORAL_CAPSULE | Freq: Every day | ORAL | Status: DC

## 2020-10-23 NOTE — Progress Notes (Signed)
Physical Therapy Treatment Patient Details Name: Sherry [name] MRN: [id number] DOB: 1955/08/16 Today's Date: 10/23/2020    History of Present Illness Pt is a 65 y/o female who presents s/p L4-L5 PLIF on 10/21/2020. Extensive PMH significant for but not limited to psych, opioid dependence, tardive dyskinesia 2 drug, HTN, CA, hep C, CKD III.    PT Comments    Pt progressing well with post-op mobility. She was able to demonstrate transfers and ambulation with gross min guard assist and RW for support. [place] rehab effort today, requiring max encouragement to get pt to participate. Discussed the difference between SNF for custodial care vs SNF for rehab and if she wants rehab, she would need to participate more with therapies and ambulate with the nursing staff when they come in. Pt expressed understanding however continues to be minimally engaged in education and today was more concerned with the therapist bringing her more ice for her drink. Pt education consisted of precautions, brace application/wearing schedule, appropriate activity progression, and car transfer. Will continue to follow.     Follow Up Recommendations  SNF;Supervision/Assistance - 24 hour     Equipment Recommendations  None recommended by PT    Recommendations for Other Services       Precautions / Restrictions Precautions Precautions: Fall;Back Precaution Booklet Issued: Yes (comment) Precaution Comments: Briefly reviewed precautions - pt not very engaged in education. Required Braces or Orthoses: Spinal Brace Spinal Brace: Lumbar corset;Applied in sitting position Restrictions Weight Bearing Restrictions: No    Mobility  Bed Mobility Overal bed mobility: Needs Assistance Bed Mobility: Sidelying to Sit;Rolling;Sit to Sidelying   Sidelying to sit: Min guard     Sit to sidelying: Min assist General bed mobility comments: VC's for log roll technique and multimodal cues for safe hand placement to keep from  twisting as pt scooted out fully to EOB. Pt required assist to elevate LE's back up into bed at end of session. Poor recall and follow-through with log roll.    Transfers Overall transfer level: Needs assistance Equipment used: Rolling walker (2 wheeled) Transfers: Sit to/from Stand Sit to Stand: Min assist;From elevated surface         General transfer comment: VC's for hand placement on seated surface for safety. Pt reaching out for therapist's hand however PT declined and reinforced pushing up from the bed.  Ambulation/Gait Ambulation/Gait assistance: Min guard Gait Distance (Feet): 30 Feet (30', seated rest break, 30') Assistive device: Rolling walker (2 wheeled) Gait Pattern/deviations: Step-through pattern;Decreased stride length Gait velocity: Decreased Gait velocity interpretation: <1.31 ft/sec, indicative of household ambulator General Gait Details: Pt ambulating extremely slow and fatigues quickly. Does not follow commands to go to the L (where staff was closer to assist [place] if needed), however does not give a reason why. Returns to the room after ambulating ~30 feet and max encouragement provided for another bout of ambulation after a seated rest break.   Stairs             Wheelchair Mobility    Modified Rankin (Stroke Patients Only)       Balance Overall balance assessment: Needs assistance Sitting-balance support: Feet supported;No upper extremity supported Sitting balance-Leahy Scale: Fair     Standing balance support: Single extremity supported;During functional activity Standing balance-Leahy Scale: Poor Standing balance comment: Reliant on UE support (at least 1 hand)  Cognition Arousal/Alertness: Awake/alert Behavior During Therapy: Flat affect Overall Cognitive Status: Difficult to assess                                 General Comments: Pt presenting with self limiting behaviors and  decreased engagement in session      Exercises      General Comments        Pertinent Vitals/Pain Pain Assessment: Faces Faces Pain Scale: Hurts whole lot Pain Location: Unclear as pt not being specific about location of her pain, but likely back/incision site Pain Descriptors / Indicators: Operative site guarding;Moaning Pain Intervention(s): Limited activity within patient's tolerance;Monitored during session;Repositioned    Home Living                      Prior Function            PT Goals (current goals can now be found in the care plan section) Acute Rehab PT Goals Patient Stated Goal: Go to rehab PT Goal Formulation: With patient Time For Goal Achievement: 11/05/20 Potential to Achieve Goals: Good Progress towards PT goals: Progressing toward goals    Frequency    Min 5X/week      PT Plan Current plan remains appropriate    Co-evaluation              AM-PAC PT "6 Clicks" Mobility   Outcome Measure  Help needed turning from your back to your side while in a flat bed without using bedrails?: None Help needed moving from lying on your back to sitting on the side of a flat bed without using bedrails?: A Little Help needed moving to and from a bed to a chair (including a wheelchair)?: A Little Help needed standing up from a chair using your arms (e.g., wheelchair or bedside chair)?: A Little Help needed to walk in hospital room?: A Little Help needed climbing 3-5 steps with a railing? : A Little 6 Click Score: 19    End of Session Equipment Utilized During Treatment: Back brace;Gait belt Activity Tolerance: Patient limited by fatigue;Patient limited by pain Patient left: in bed;with call bell/phone within reach Nurse Communication: Mobility status PT Visit Diagnosis: Unsteadiness on feet (R26.81);Pain Pain - part of body:  (back)     Time: 1771-1657 PT Time Calculation (min) (ACUTE ONLY): 28 min  Charges:  $Gait Training: 23-37  mins                     [name], PT, DPT Acute Rehabilitation Services Pager: [phone] Office: [phone]    [name] 10/23/2020, 11:11 AM

## 2020-10-23 NOTE — TOC Transition Note (Signed)
Transition of Care [facility]) - CM/SW Discharge Note   Patient Details  Name: Sherry [name] MRN: [id number] Date of Birth: 1956-01-08  [facility] CM/SW Contact:  Bethann Berkshire, [place] Phone Number: 10/23/2020, 12:11 PM   Clinical Narrative:     Documents uploaded to [facility].    Patient will DC to: [place] Anticipated DC date: 10/23/20 Family notified: Donzetta Kohut (Sister)  [phone] [place]) Transport by: Corey Harold   Per MD patient ready for DC to [facility]. RN, patient, patient's family, and facility notified of DC. Discharge Summary and FL2 sent to facility. RN to call report prior to discharge [phone] Room 101). DC packet on chart. Ambulance transport requested for patient.   CSW will sign off for now as social work intervention is no longer needed. Please consult [place] again if new needs arise.   Final next level of care: Skilled Nursing Facility Barriers to Discharge: No Barriers Identified   Patient Goals and CMS Choice Patient states their goals for this hospitalization and ongoing recovery are:: Rehab at [facility] CMS Medicare.gov Compare Post Acute Care list provided to:: Patient Choice offered to / list presented to : Patient  Discharge Placement              Patient chooses bed at: [facility] Patient to be transferred to facility by: [place] Name of family member notified: Donzetta Kohut (Sister)   [phone] [facility]) Patient and family notified of of transfer: 10/23/20  Discharge Plan and Services                                     Social Determinants of Health (SDOH) Interventions     Readmission Risk Interventions No flowsheet data found.

## 2020-10-23 NOTE — Discharge Instructions (Addendum)
Wound Care Keep incision covered and dry for two days.    Do not put any creams, lotions, or ointments on incision. Leave steri-strips on back.  They will fall off by themselves.  Activity Walk each and every day, increasing distance each day. No lifting greater than 5 lbs. No driving for 2 weeks; may ride as a passenger locally.  Diet Resume your normal diet.     Call Your Doctor If Any of These Occur Redness, drainage, or swelling at the wound.  Temperature greater than 101 degrees. Severe pain not relieved by pain medication. Incision starts to come apart.  Follow Up Appt Call today for appointment in 3 weeks [phone]) or for problems.  If you have any hardware placed in your spine, you will need an x-ray before your appointment.

## 2020-10-23 NOTE — Progress Notes (Signed)
Patient being discharged to [facility].  Patient to be transported by [facility].  IV removed with the catheter intact and incision dressing changed-CDI.  Discharge instructions and prescription information placed in the packet for discharge.  Report already called to receiving Nurse at the facility.

## 2020-10-23 NOTE — Discharge Summary (Signed)
Physician Discharge Summary     Providing Compassionate, Quality Care - Together   Patient ID: Sherry [name] MRN: [id number] DOB/AGE: [birth year] 65 y.o.  Admit date: 10/21/2020 Discharge date: 10/23/2020  Admission Diagnoses: Spondylolisthesis, lumbar region  Discharge Diagnoses:  Active Problems:   Spondylolisthesis, lumbar region   Discharged Condition: good  Hospital Course: Patient underwent an L4-5 posterior fusion by Dr. Arnoldo Morale on 10/21/2020. She was admitted to 3C08 following recovery from anesthesia in the PACU. Her postoperative course has been uncomplicated. She has worked with both physical and occupational therapies who feel the patient is ready for discharge to a skilled nursing facility. She is ambulating independently and without difficulty. She is tolerating a normal diet. She is not having any bowel or bladder dysfunction. Her pain is reasonably controlled with oral pain medication. She is ready for discharge to [facility].   Consults: rehabilitation medicine  Significant Diagnostic Studies: radiology: DG Lumbar Spine 2-3 Views  Result Date: 10/21/2020 CLINICAL DATA:  L4-L5 PLIF. EXAM: LUMBAR SPINE - 2-3 VIEW; DG C-ARM 1-60 MIN COMPARISON:  Lumbar MRI 07/02/2020 indicating 5 lumbar vertebra. FINDINGS: Two fluoroscopic spot views of the lumbar spine obtained in the operating room in frontal and lateral projection. Intrapedicular screws at L4 and L5 with interbody spacer. Total fluoroscopy time 28 seconds. Total dose 24.29 mGy. IMPRESSION: Intraoperative fluoroscopy during L4-L5 fusion. Electronically Signed   By: Keith Rake M.D.   On: 10/21/2020 16:38   DG C-Arm 1-60 Min  Result Date: 10/21/2020 CLINICAL DATA:  L4-L5 PLIF. EXAM: LUMBAR SPINE - 2-3 VIEW; DG C-ARM 1-60 MIN COMPARISON:  Lumbar MRI 07/02/2020 indicating 5 lumbar vertebra. FINDINGS: Two fluoroscopic spot views of the lumbar spine obtained in the operating room in frontal and lateral projection.  Intrapedicular screws at L4 and L5 with interbody spacer. Total fluoroscopy time 28 seconds. Total dose 24.29 mGy. IMPRESSION: Intraoperative fluoroscopy during L4-L5 fusion. Electronically Signed   By: Keith Rake M.D.   On: 10/21/2020 16:38     Treatments: surgery: Bilateral L4-5 laminotomy/foraminotomies/medial facetectomy to decompress the bilateral L4 and L5 nerve roots(the work required to do this was in addition to the work required to do the posterior lumbar interbody fusion because of the patient's spinal stenosis, facet arthropathy. Etc. requiring a wide decompression of the nerve roots.);  L4-5 transforaminal lumbar interbody fusion with local morselized autograft bone and Zimmer DBM; insertion of interbody prosthesis at L4-5 (globus peek expandable interbody prosthesis); posterior nonsegmental instrumentation from L4 to L5 with globus titanium pedicle screws and rods; posterior lateral arthrodesis at L4-5 with local morselized autograft bone and Zimmer DBM.  Discharge Exam: Blood pressure (!) 118/44, pulse 77, temperature 98.8 F (37.1 C), temperature source Oral, resp. rate 16, height 5\' 2"  (1.575 m), weight 100.7 kg, SpO2 100 %.  Alert and oriented x 4 PERRLA CN II-XII grossly intact MAE, Strength and sensation intact Incision is covered with a gauze dressing and Steri Strips; Dressing is intact with small amount of sanguinous drainage  Disposition: Discharge disposition: 03-Skilled Nursing Facility        Allergies as of 10/23/2020      Reactions   Abilify [aripiprazole] Other (See Comments)   Tardive dyskinesia Oral   Remeron [mirtazapine] Other (See Comments)   Wgt stimulation /gain, Dizziness, Patient says "can tolerate"   Trazodone And Nefazodone Other (See Comments)   Nightmares/sleep diturbance   Flexeril [cyclobenzaprine] Other (See Comments)   Pt states Flexeril makes her feel depressed    Amoxicillin Diarrhea, Other (See  Comments)   NOTE the patient has  had PCN WITHOUT reaction Has patient had a PCN reaction causing immediate rash, facial/tongue/throat swelling, SOB or lightheadedness with hypotension: No Has patient had a PCN reaction causing severe rash involving mucus membranes or skin necrosis: No Has patient had a PCN reaction that required hospitalization: No Has patient had a PCN reaction occurring within the last 10 years: No If all of the above answers are "NO", then may proceed with Cephalosporin use.      Medication List    STOP taking these medications   gabapentin 800 MG tablet Commonly known as: NEURONTIN Replaced by: gabapentin 400 MG capsule   HYDROcodone-acetaminophen 10-325 MG tablet Commonly known as: NORCO   HYDROcodone-acetaminophen 5-325 MG tablet Commonly known as: NORCO/VICODIN     TAKE these medications   amLODipine 10 MG tablet Commonly known as: NORVASC TAKE 1 TABLET BY MOUTH DAILY What changed: when to take this   diphenhydramine-acetaminophen 25-500 MG Tabs tablet Commonly known as: TYLENOL PM Take 1 tablet by mouth at bedtime.   docusate sodium 100 MG capsule Commonly known as: COLACE Take 1 capsule (100 mg total) by mouth 2 (two) times daily.   FeroSul 325 (65 FE) MG tablet Generic drug: ferrous sulfate TAKE 1 TABLET BY MOUTH DAILY WITH BREAKFAST What changed: how much to take   FLUoxetine 40 MG capsule Commonly known as: PROZAC Take 40 mg by mouth in the morning.   gabapentin 400 MG capsule Commonly known as: NEURONTIN Take 2 capsules (800 mg total) by mouth at bedtime. Replaces: gabapentin 800 MG tablet   hydrALAZINE 100 MG tablet Commonly known as: APRESOLINE Take 100 mg by mouth 3 (three) times daily.   Ingrezza 80 MG capsule Generic drug: valbenazine Take 80 mg by mouth at bedtime.   isosorbide mononitrate 60 MG 24 hr tablet Commonly known as: IMDUR TAKE 1 TABLET BY MOUTH DAILY What changed: when to take this   labetalol 300 MG tablet Commonly known as:  NORMODYNE Take 300 mg by mouth 2 (two) times daily.   lidocaine 5 % Commonly known as: Lidoderm Place 1 patch onto the skin daily. Remove & Discard patch within 12 hours or as directed by MD   melatonin 5 MG Tabs Take 5 mg by mouth at bedtime.   methocarbamol 500 MG tablet Commonly known as: ROBAXIN Take 1-2 tablets (500-1,000 mg total) by mouth every 6 (six) hours as needed for muscle spasms. What changed: reasons to take this   ondansetron 4 MG tablet Commonly known as: ZOFRAN TAKE 1 TABLET BY MOUTH EVERY 8 HOURS AS NEEDED FOR NAUSEA OR VOMITING What changed: reasons to take this   Oxycodone HCl 10 MG Tabs Take 1 tablet (10 mg total) by mouth every 4 (four) hours as needed for severe pain ((score 7 to 10)).   pantoprazole 20 MG tablet Commonly known as: PROTONIX Take 1 tablet (20 mg total) by mouth 2 (two) times daily.   sodium bicarbonate 650 MG tablet Take 650 mg by mouth 2 (two) times daily.   Vitamin D3 50 MCG (2000 UT) capsule Take 1 capsule (2,000 Units total) by mouth daily. What changed: when to take this       Follow-up Information    Newman Pies, MD. Schedule an appointment as soon as possible for a visit in 3 week(s).   Specialty: Neurosurgery Contact information: [address] [place] [address] [phone]  Signed: Viona Gilmore, DNP, AGNP-C Nurse Practitioner  [facility] [place] [address], [address], [place], [place] [address] P: [phone]    F: [phone]  10/23/2020, 11:28 AM

## 2020-10-23 NOTE — Progress Notes (Signed)
  Report given to Receiving facility RN. With updated vitals and medication given. Patient awaiting [facility]AR for pick-up

## 2020-10-23 NOTE — Progress Notes (Signed)
Subjective: The patient is alert and pleasant.  Her back is appropriately sore.  Objective: Vital signs in last 24 hours: Temp:  [98.3 F (36.8 C)-98.8 F (37.1 C)] 98.8 F (37.1 C) (04/13 0803) Pulse Rate:  [65-81] 77 (04/13 0803) Resp:  [16-17] 16 (04/13 0803) BP: (96-143)/(44-69) 118/44 (04/13 0803) SpO2:  [98 %-100 %] 100 % (04/13 0803) Estimated body mass index is 40.6 kg/m as calculated from the following:   Height as of this encounter: 5\' 2"  (1.575 m).   Weight as of this encounter: 100.7 kg.   Intake/Output from previous day: 04/12 0701 - 04/13 0700 In: -  Out: 300 [Urine:300] Intake/Output this shift: No intake/output data recorded.  Physical exam the patient is alert and oriented.  Her dressing is clean and dry.  Her lower extremity strength is grossly normal.  Lab Results: Recent Labs    10/22/20 0730  WBC 11.9*  HGB 8.7*  HCT 28.8*  PLT 218   BMET Recent Labs    10/21/20 0937 10/22/20 0730  NA 138 134*  K 4.6 5.1  CL 107 105  CO2 20* 18*  GLUCOSE 108* 109*  BUN 39* 39*  CREATININE 3.58* 3.60*  CALCIUM 9.1 8.6*    Studies/Results: DG Lumbar Spine 2-3 Views  Result Date: 10/21/2020 CLINICAL DATA:  L4-L5 PLIF. EXAM: LUMBAR SPINE - 2-3 VIEW; DG C-ARM 1-60 MIN COMPARISON:  Lumbar MRI 07/02/2020 indicating 5 lumbar vertebra. FINDINGS: Two fluoroscopic spot views of the lumbar spine obtained in the operating room in frontal and lateral projection. Intrapedicular screws at L4 and L5 with interbody spacer. Total fluoroscopy time 28 seconds. Total dose 24.29 mGy. IMPRESSION: Intraoperative fluoroscopy during L4-L5 fusion. Electronically Signed   By: Keith Rake M.D.   On: 10/21/2020 16:38   DG C-Arm 1-60 Min  Result Date: 10/21/2020 CLINICAL DATA:  L4-L5 PLIF. EXAM: LUMBAR SPINE - 2-3 VIEW; DG C-ARM 1-60 MIN COMPARISON:  Lumbar MRI 07/02/2020 indicating 5 lumbar vertebra. FINDINGS: Two fluoroscopic spot views of the lumbar spine obtained in the  operating room in frontal and lateral projection. Intrapedicular screws at L4 and L5 with interbody spacer. Total fluoroscopy time 28 seconds. Total dose 24.29 mGy. IMPRESSION: Intraoperative fluoroscopy during L4-L5 fusion. Electronically Signed   By: Keith Rake M.D.   On: 10/21/2020 16:38    Assessment/Plan: Postop day #2: The patient is slowly progressing.  She is ready for skilled nursing facility placement.  I have answered all her questions.  LOS: 2 days     Ophelia Charter 10/23/2020, 10:48 AM

## 2020-10-23 NOTE — Plan of Care (Signed)
Adequately ready for discharged 

## 2020-10-24 ENCOUNTER — Other Ambulatory Visit: Payer: Self-pay

## 2020-10-24 DIAGNOSIS — M5440 Lumbago with sciatica, unspecified side: Secondary | ICD-10-CM | POA: Diagnosis not present

## 2020-10-24 DIAGNOSIS — R262 Difficulty in walking, not elsewhere classified: Secondary | ICD-10-CM | POA: Diagnosis not present

## 2020-10-24 DIAGNOSIS — Z981 Arthrodesis status: Secondary | ICD-10-CM | POA: Diagnosis not present

## 2020-10-24 DIAGNOSIS — M545 Low back pain, unspecified: Secondary | ICD-10-CM | POA: Diagnosis not present

## 2020-10-24 DIAGNOSIS — I10 Essential (primary) hypertension: Secondary | ICD-10-CM | POA: Diagnosis not present

## 2020-10-24 DIAGNOSIS — R5381 Other malaise: Secondary | ICD-10-CM | POA: Diagnosis not present

## 2020-10-24 MED FILL — Heparin Sodium (Porcine) Inj 1000 Unit/ML: INTRAMUSCULAR | Qty: 30 | Status: AC

## 2020-10-24 MED FILL — Sodium Chloride IV Soln 0.9%: INTRAVENOUS | Qty: 1000 | Status: AC

## 2020-10-24 NOTE — Patient Outreach (Signed)
[facility]) Care Management  10/24/2020  Sherry [name] 21-Mar-1956 [id number]     Transition of Care Referral  Referral Date: 10/24/2020 Referral Source: [facility] Discharge Report Date of Discharge: 10/23/2020 Facility: [place]    Referral received. Upon chart review noted that patient discharged form hospital to SNF.     Plan: RN CM will close referral at this time.   [name], RN,BSN,CCM [place] Management Telephonic Care Management Coordinator Direct Phone: [phone] Toll Free: [phone] Fax: [phone]

## 2020-11-08 DIAGNOSIS — M5440 Lumbago with sciatica, unspecified side: Secondary | ICD-10-CM | POA: Diagnosis not present

## 2020-11-08 DIAGNOSIS — R110 Nausea: Secondary | ICD-10-CM | POA: Diagnosis not present

## 2020-11-08 DIAGNOSIS — K5909 Other constipation: Secondary | ICD-10-CM | POA: Diagnosis not present

## 2020-11-08 DIAGNOSIS — R1013 Epigastric pain: Secondary | ICD-10-CM | POA: Diagnosis not present

## 2020-11-11 DIAGNOSIS — R110 Nausea: Secondary | ICD-10-CM | POA: Diagnosis not present

## 2020-11-11 DIAGNOSIS — K219 Gastro-esophageal reflux disease without esophagitis: Secondary | ICD-10-CM | POA: Diagnosis not present

## 2020-11-11 DIAGNOSIS — K5909 Other constipation: Secondary | ICD-10-CM | POA: Diagnosis not present

## 2020-11-11 DIAGNOSIS — M5440 Lumbago with sciatica, unspecified side: Secondary | ICD-10-CM | POA: Diagnosis not present

## 2020-11-11 DIAGNOSIS — R1013 Epigastric pain: Secondary | ICD-10-CM | POA: Diagnosis not present

## 2020-11-11 DIAGNOSIS — F1120 Opioid dependence, uncomplicated: Secondary | ICD-10-CM | POA: Diagnosis not present

## 2020-11-19 DIAGNOSIS — M5440 Lumbago with sciatica, unspecified side: Secondary | ICD-10-CM | POA: Diagnosis not present

## 2020-11-19 DIAGNOSIS — R0981 Nasal congestion: Secondary | ICD-10-CM | POA: Diagnosis not present

## 2020-11-19 DIAGNOSIS — R110 Nausea: Secondary | ICD-10-CM | POA: Diagnosis not present

## 2020-11-19 DIAGNOSIS — M79651 Pain in right thigh: Secondary | ICD-10-CM | POA: Diagnosis not present

## 2020-11-19 DIAGNOSIS — G4700 Insomnia, unspecified: Secondary | ICD-10-CM | POA: Diagnosis not present

## 2020-11-19 DIAGNOSIS — R059 Cough, unspecified: Secondary | ICD-10-CM | POA: Diagnosis not present

## 2020-11-19 DIAGNOSIS — R0602 Shortness of breath: Secondary | ICD-10-CM | POA: Diagnosis not present

## 2020-11-19 DIAGNOSIS — F17213 Nicotine dependence, cigarettes, with withdrawal: Secondary | ICD-10-CM | POA: Diagnosis not present

## 2020-11-19 DIAGNOSIS — J069 Acute upper respiratory infection, unspecified: Secondary | ICD-10-CM | POA: Diagnosis not present

## 2020-11-21 DIAGNOSIS — F1190 Opioid use, unspecified, uncomplicated: Secondary | ICD-10-CM | POA: Diagnosis not present

## 2020-11-21 DIAGNOSIS — R0981 Nasal congestion: Secondary | ICD-10-CM | POA: Diagnosis not present

## 2020-11-21 DIAGNOSIS — R0602 Shortness of breath: Secondary | ICD-10-CM | POA: Diagnosis not present

## 2020-11-21 DIAGNOSIS — G894 Chronic pain syndrome: Secondary | ICD-10-CM | POA: Diagnosis not present

## 2020-11-21 DIAGNOSIS — M5440 Lumbago with sciatica, unspecified side: Secondary | ICD-10-CM | POA: Diagnosis not present

## 2020-11-21 DIAGNOSIS — R0989 Other specified symptoms and signs involving the circulatory and respiratory systems: Secondary | ICD-10-CM | POA: Diagnosis not present

## 2020-11-26 DIAGNOSIS — F1190 Opioid use, unspecified, uncomplicated: Secondary | ICD-10-CM | POA: Diagnosis not present

## 2020-11-26 DIAGNOSIS — M4316 Spondylolisthesis, lumbar region: Secondary | ICD-10-CM | POA: Diagnosis not present

## 2020-11-26 DIAGNOSIS — F419 Anxiety disorder, unspecified: Secondary | ICD-10-CM | POA: Diagnosis not present

## 2020-11-26 DIAGNOSIS — M5440 Lumbago with sciatica, unspecified side: Secondary | ICD-10-CM | POA: Diagnosis not present

## 2020-11-26 DIAGNOSIS — R2681 Unsteadiness on feet: Secondary | ICD-10-CM | POA: Diagnosis not present

## 2020-11-26 DIAGNOSIS — G894 Chronic pain syndrome: Secondary | ICD-10-CM | POA: Diagnosis not present

## 2020-11-26 DIAGNOSIS — M6281 Muscle weakness (generalized): Secondary | ICD-10-CM | POA: Diagnosis not present

## 2020-11-27 DIAGNOSIS — F419 Anxiety disorder, unspecified: Secondary | ICD-10-CM | POA: Diagnosis not present

## 2020-11-27 DIAGNOSIS — M6281 Muscle weakness (generalized): Secondary | ICD-10-CM | POA: Diagnosis not present

## 2020-11-27 DIAGNOSIS — R2681 Unsteadiness on feet: Secondary | ICD-10-CM | POA: Diagnosis not present

## 2020-11-28 DIAGNOSIS — M6281 Muscle weakness (generalized): Secondary | ICD-10-CM | POA: Diagnosis not present

## 2020-11-28 DIAGNOSIS — R2681 Unsteadiness on feet: Secondary | ICD-10-CM | POA: Diagnosis not present

## 2020-11-28 DIAGNOSIS — F332 Major depressive disorder, recurrent severe without psychotic features: Secondary | ICD-10-CM | POA: Diagnosis not present

## 2020-11-28 DIAGNOSIS — M4316 Spondylolisthesis, lumbar region: Secondary | ICD-10-CM | POA: Diagnosis not present

## 2020-11-28 DIAGNOSIS — I5032 Chronic diastolic (congestive) heart failure: Secondary | ICD-10-CM | POA: Diagnosis not present

## 2020-11-28 DIAGNOSIS — I10 Essential (primary) hypertension: Secondary | ICD-10-CM | POA: Diagnosis not present

## 2020-11-28 DIAGNOSIS — N184 Chronic kidney disease, stage 4 (severe): Secondary | ICD-10-CM | POA: Diagnosis not present

## 2020-11-28 DIAGNOSIS — F319 Bipolar disorder, unspecified: Secondary | ICD-10-CM | POA: Diagnosis not present

## 2020-11-28 DIAGNOSIS — F419 Anxiety disorder, unspecified: Secondary | ICD-10-CM | POA: Diagnosis not present

## 2020-12-02 DIAGNOSIS — M4326 Fusion of spine, lumbar region: Secondary | ICD-10-CM | POA: Diagnosis not present

## 2020-12-02 DIAGNOSIS — M6281 Muscle weakness (generalized): Secondary | ICD-10-CM | POA: Diagnosis not present

## 2020-12-02 DIAGNOSIS — M4316 Spondylolisthesis, lumbar region: Secondary | ICD-10-CM | POA: Diagnosis not present

## 2020-12-06 DIAGNOSIS — I5030 Unspecified diastolic (congestive) heart failure: Secondary | ICD-10-CM | POA: Diagnosis not present

## 2020-12-06 DIAGNOSIS — G4700 Insomnia, unspecified: Secondary | ICD-10-CM | POA: Diagnosis not present

## 2020-12-06 DIAGNOSIS — Z981 Arthrodesis status: Secondary | ICD-10-CM | POA: Diagnosis not present

## 2020-12-06 DIAGNOSIS — M4316 Spondylolisthesis, lumbar region: Secondary | ICD-10-CM | POA: Diagnosis not present

## 2020-12-06 DIAGNOSIS — Z4789 Encounter for other orthopedic aftercare: Secondary | ICD-10-CM | POA: Diagnosis not present

## 2020-12-06 DIAGNOSIS — I130 Hypertensive heart and chronic kidney disease with heart failure and stage 1 through stage 4 chronic kidney disease, or unspecified chronic kidney disease: Secondary | ICD-10-CM | POA: Diagnosis not present

## 2020-12-06 DIAGNOSIS — N184 Chronic kidney disease, stage 4 (severe): Secondary | ICD-10-CM | POA: Diagnosis not present

## 2020-12-06 DIAGNOSIS — G8929 Other chronic pain: Secondary | ICD-10-CM | POA: Diagnosis not present

## 2020-12-06 DIAGNOSIS — K219 Gastro-esophageal reflux disease without esophagitis: Secondary | ICD-10-CM | POA: Diagnosis not present

## 2020-12-08 DIAGNOSIS — F419 Anxiety disorder, unspecified: Secondary | ICD-10-CM | POA: Diagnosis not present

## 2020-12-08 DIAGNOSIS — Z853 Personal history of malignant neoplasm of breast: Secondary | ICD-10-CM | POA: Diagnosis not present

## 2020-12-08 DIAGNOSIS — J811 Chronic pulmonary edema: Secondary | ICD-10-CM | POA: Diagnosis not present

## 2020-12-08 DIAGNOSIS — R079 Chest pain, unspecified: Secondary | ICD-10-CM | POA: Diagnosis not present

## 2020-12-08 DIAGNOSIS — N179 Acute kidney failure, unspecified: Secondary | ICD-10-CM | POA: Diagnosis not present

## 2020-12-08 DIAGNOSIS — I5033 Acute on chronic diastolic (congestive) heart failure: Secondary | ICD-10-CM | POA: Diagnosis not present

## 2020-12-08 DIAGNOSIS — R110 Nausea: Secondary | ICD-10-CM | POA: Diagnosis not present

## 2020-12-08 DIAGNOSIS — I10 Essential (primary) hypertension: Secondary | ICD-10-CM | POA: Diagnosis not present

## 2020-12-08 DIAGNOSIS — I110 Hypertensive heart disease with heart failure: Secondary | ICD-10-CM | POA: Diagnosis not present

## 2020-12-08 DIAGNOSIS — R0789 Other chest pain: Secondary | ICD-10-CM | POA: Diagnosis not present

## 2020-12-08 DIAGNOSIS — G8929 Other chronic pain: Secondary | ICD-10-CM | POA: Diagnosis not present

## 2020-12-08 DIAGNOSIS — I5032 Chronic diastolic (congestive) heart failure: Secondary | ICD-10-CM | POA: Diagnosis not present

## 2020-12-08 DIAGNOSIS — I129 Hypertensive chronic kidney disease with stage 1 through stage 4 chronic kidney disease, or unspecified chronic kidney disease: Secondary | ICD-10-CM | POA: Diagnosis not present

## 2020-12-08 DIAGNOSIS — J810 Acute pulmonary edema: Secondary | ICD-10-CM | POA: Diagnosis not present

## 2020-12-08 DIAGNOSIS — D631 Anemia in chronic kidney disease: Secondary | ICD-10-CM | POA: Diagnosis not present

## 2020-12-08 DIAGNOSIS — R001 Bradycardia, unspecified: Secondary | ICD-10-CM | POA: Diagnosis not present

## 2020-12-08 DIAGNOSIS — M25552 Pain in left hip: Secondary | ICD-10-CM | POA: Diagnosis not present

## 2020-12-08 DIAGNOSIS — N184 Chronic kidney disease, stage 4 (severe): Secondary | ICD-10-CM | POA: Diagnosis not present

## 2020-12-08 DIAGNOSIS — R0602 Shortness of breath: Secondary | ICD-10-CM | POA: Diagnosis not present

## 2020-12-08 DIAGNOSIS — R55 Syncope and collapse: Secondary | ICD-10-CM | POA: Diagnosis not present

## 2020-12-08 DIAGNOSIS — B182 Chronic viral hepatitis C: Secondary | ICD-10-CM | POA: Diagnosis not present

## 2020-12-08 DIAGNOSIS — M1612 Unilateral primary osteoarthritis, left hip: Secondary | ICD-10-CM | POA: Diagnosis not present

## 2020-12-08 DIAGNOSIS — F17210 Nicotine dependence, cigarettes, uncomplicated: Secondary | ICD-10-CM | POA: Diagnosis not present

## 2020-12-08 DIAGNOSIS — I130 Hypertensive heart and chronic kidney disease with heart failure and stage 1 through stage 4 chronic kidney disease, or unspecified chronic kidney disease: Secondary | ICD-10-CM | POA: Diagnosis not present

## 2020-12-08 DIAGNOSIS — M25551 Pain in right hip: Secondary | ICD-10-CM | POA: Diagnosis not present

## 2020-12-08 DIAGNOSIS — I2510 Atherosclerotic heart disease of native coronary artery without angina pectoris: Secondary | ICD-10-CM | POA: Diagnosis not present

## 2020-12-08 DIAGNOSIS — F32A Depression, unspecified: Secondary | ICD-10-CM | POA: Diagnosis not present

## 2020-12-08 DIAGNOSIS — I5030 Unspecified diastolic (congestive) heart failure: Secondary | ICD-10-CM | POA: Diagnosis not present

## 2020-12-08 DIAGNOSIS — R7989 Other specified abnormal findings of blood chemistry: Secondary | ICD-10-CM | POA: Diagnosis not present

## 2020-12-08 DIAGNOSIS — J9601 Acute respiratory failure with hypoxia: Secondary | ICD-10-CM | POA: Diagnosis not present

## 2020-12-08 DIAGNOSIS — E8779 Other fluid overload: Secondary | ICD-10-CM | POA: Diagnosis not present

## 2020-12-08 DIAGNOSIS — F1990 Other psychoactive substance use, unspecified, uncomplicated: Secondary | ICD-10-CM | POA: Diagnosis not present

## 2020-12-09 DIAGNOSIS — B182 Chronic viral hepatitis C: Secondary | ICD-10-CM | POA: Diagnosis not present

## 2020-12-09 DIAGNOSIS — N184 Chronic kidney disease, stage 4 (severe): Secondary | ICD-10-CM | POA: Diagnosis not present

## 2020-12-09 DIAGNOSIS — R001 Bradycardia, unspecified: Secondary | ICD-10-CM | POA: Diagnosis not present

## 2020-12-09 DIAGNOSIS — F17210 Nicotine dependence, cigarettes, uncomplicated: Secondary | ICD-10-CM | POA: Diagnosis not present

## 2020-12-09 DIAGNOSIS — I129 Hypertensive chronic kidney disease with stage 1 through stage 4 chronic kidney disease, or unspecified chronic kidney disease: Secondary | ICD-10-CM | POA: Diagnosis not present

## 2020-12-09 DIAGNOSIS — J9601 Acute respiratory failure with hypoxia: Secondary | ICD-10-CM | POA: Diagnosis not present

## 2020-12-09 DIAGNOSIS — I5030 Unspecified diastolic (congestive) heart failure: Secondary | ICD-10-CM | POA: Diagnosis not present

## 2020-12-09 DIAGNOSIS — N179 Acute kidney failure, unspecified: Secondary | ICD-10-CM | POA: Diagnosis not present

## 2020-12-09 DIAGNOSIS — I5033 Acute on chronic diastolic (congestive) heart failure: Secondary | ICD-10-CM | POA: Diagnosis not present

## 2020-12-09 DIAGNOSIS — J811 Chronic pulmonary edema: Secondary | ICD-10-CM | POA: Diagnosis not present

## 2020-12-09 DIAGNOSIS — E8779 Other fluid overload: Secondary | ICD-10-CM | POA: Diagnosis not present

## 2020-12-09 DIAGNOSIS — M25552 Pain in left hip: Secondary | ICD-10-CM | POA: Diagnosis not present

## 2020-12-09 DIAGNOSIS — Z853 Personal history of malignant neoplasm of breast: Secondary | ICD-10-CM | POA: Diagnosis not present

## 2020-12-09 DIAGNOSIS — I130 Hypertensive heart and chronic kidney disease with heart failure and stage 1 through stage 4 chronic kidney disease, or unspecified chronic kidney disease: Secondary | ICD-10-CM | POA: Diagnosis not present

## 2020-12-09 DIAGNOSIS — R0789 Other chest pain: Secondary | ICD-10-CM | POA: Diagnosis not present

## 2020-12-09 DIAGNOSIS — F419 Anxiety disorder, unspecified: Secondary | ICD-10-CM | POA: Diagnosis not present

## 2020-12-09 DIAGNOSIS — D631 Anemia in chronic kidney disease: Secondary | ICD-10-CM | POA: Diagnosis not present

## 2020-12-09 DIAGNOSIS — M1612 Unilateral primary osteoarthritis, left hip: Secondary | ICD-10-CM | POA: Diagnosis not present

## 2020-12-09 DIAGNOSIS — I110 Hypertensive heart disease with heart failure: Secondary | ICD-10-CM | POA: Diagnosis not present

## 2020-12-09 DIAGNOSIS — I5032 Chronic diastolic (congestive) heart failure: Secondary | ICD-10-CM | POA: Diagnosis not present

## 2020-12-09 DIAGNOSIS — M25551 Pain in right hip: Secondary | ICD-10-CM | POA: Diagnosis not present

## 2020-12-09 DIAGNOSIS — R110 Nausea: Secondary | ICD-10-CM | POA: Diagnosis not present

## 2020-12-09 DIAGNOSIS — F1990 Other psychoactive substance use, unspecified, uncomplicated: Secondary | ICD-10-CM | POA: Diagnosis not present

## 2020-12-09 DIAGNOSIS — R0602 Shortness of breath: Secondary | ICD-10-CM | POA: Diagnosis not present

## 2020-12-09 DIAGNOSIS — G8929 Other chronic pain: Secondary | ICD-10-CM | POA: Diagnosis not present

## 2020-12-09 DIAGNOSIS — R7989 Other specified abnormal findings of blood chemistry: Secondary | ICD-10-CM | POA: Diagnosis not present

## 2020-12-10 ENCOUNTER — Telehealth: Payer: Self-pay | Admitting: Family Medicine

## 2020-12-10 DIAGNOSIS — Z853 Personal history of malignant neoplasm of breast: Secondary | ICD-10-CM | POA: Diagnosis not present

## 2020-12-10 DIAGNOSIS — D631 Anemia in chronic kidney disease: Secondary | ICD-10-CM | POA: Diagnosis not present

## 2020-12-10 DIAGNOSIS — F1990 Other psychoactive substance use, unspecified, uncomplicated: Secondary | ICD-10-CM | POA: Diagnosis not present

## 2020-12-10 DIAGNOSIS — N184 Chronic kidney disease, stage 4 (severe): Secondary | ICD-10-CM | POA: Diagnosis not present

## 2020-12-10 DIAGNOSIS — J9601 Acute respiratory failure with hypoxia: Secondary | ICD-10-CM | POA: Diagnosis not present

## 2020-12-10 DIAGNOSIS — R7989 Other specified abnormal findings of blood chemistry: Secondary | ICD-10-CM | POA: Diagnosis not present

## 2020-12-10 DIAGNOSIS — M25551 Pain in right hip: Secondary | ICD-10-CM | POA: Diagnosis not present

## 2020-12-10 DIAGNOSIS — J811 Chronic pulmonary edema: Secondary | ICD-10-CM | POA: Diagnosis not present

## 2020-12-10 DIAGNOSIS — M1612 Unilateral primary osteoarthritis, left hip: Secondary | ICD-10-CM | POA: Diagnosis not present

## 2020-12-10 DIAGNOSIS — I129 Hypertensive chronic kidney disease with stage 1 through stage 4 chronic kidney disease, or unspecified chronic kidney disease: Secondary | ICD-10-CM | POA: Diagnosis not present

## 2020-12-10 DIAGNOSIS — R001 Bradycardia, unspecified: Secondary | ICD-10-CM | POA: Diagnosis not present

## 2020-12-10 DIAGNOSIS — M25552 Pain in left hip: Secondary | ICD-10-CM | POA: Diagnosis not present

## 2020-12-10 NOTE — Telephone Encounter (Signed)
It appears she is admitted at [facility]. They may be able to arrange home orders. If not, then does need face to face visit as I have not seen her since January (F2F required within 30 days).

## 2020-12-10 NOTE — Telephone Encounter (Signed)
..  Home Health Verbal Orders  Agency:  [facility] Caller: Joe Contact and title Physical therapist  Requesting OT/ PT/ Skilled nursing/ Social Work/ Speech:    Physical Therapy  Reason for Request:  Lower extremity strength and walking independently in the home  Frequency:  1 week 1 time, 2nd week 4 times, then 1 week x 1 time   HH needs F2F w/in last 30 days

## 2020-12-11 DIAGNOSIS — R001 Bradycardia, unspecified: Secondary | ICD-10-CM | POA: Diagnosis not present

## 2020-12-11 DIAGNOSIS — Z853 Personal history of malignant neoplasm of breast: Secondary | ICD-10-CM | POA: Diagnosis not present

## 2020-12-11 DIAGNOSIS — I129 Hypertensive chronic kidney disease with stage 1 through stage 4 chronic kidney disease, or unspecified chronic kidney disease: Secondary | ICD-10-CM | POA: Diagnosis not present

## 2020-12-11 DIAGNOSIS — R7989 Other specified abnormal findings of blood chemistry: Secondary | ICD-10-CM | POA: Diagnosis not present

## 2020-12-11 DIAGNOSIS — J9601 Acute respiratory failure with hypoxia: Secondary | ICD-10-CM | POA: Diagnosis not present

## 2020-12-11 DIAGNOSIS — N184 Chronic kidney disease, stage 4 (severe): Secondary | ICD-10-CM | POA: Diagnosis not present

## 2020-12-11 DIAGNOSIS — J811 Chronic pulmonary edema: Secondary | ICD-10-CM | POA: Diagnosis not present

## 2020-12-11 DIAGNOSIS — F1990 Other psychoactive substance use, unspecified, uncomplicated: Secondary | ICD-10-CM | POA: Diagnosis not present

## 2020-12-11 DIAGNOSIS — D631 Anemia in chronic kidney disease: Secondary | ICD-10-CM | POA: Diagnosis not present

## 2020-12-13 DIAGNOSIS — G4700 Insomnia, unspecified: Secondary | ICD-10-CM | POA: Diagnosis not present

## 2020-12-13 DIAGNOSIS — I130 Hypertensive heart and chronic kidney disease with heart failure and stage 1 through stage 4 chronic kidney disease, or unspecified chronic kidney disease: Secondary | ICD-10-CM | POA: Diagnosis not present

## 2020-12-13 DIAGNOSIS — G8929 Other chronic pain: Secondary | ICD-10-CM | POA: Diagnosis not present

## 2020-12-13 DIAGNOSIS — Z981 Arthrodesis status: Secondary | ICD-10-CM | POA: Diagnosis not present

## 2020-12-13 DIAGNOSIS — I5030 Unspecified diastolic (congestive) heart failure: Secondary | ICD-10-CM | POA: Diagnosis not present

## 2020-12-13 DIAGNOSIS — Z4789 Encounter for other orthopedic aftercare: Secondary | ICD-10-CM | POA: Diagnosis not present

## 2020-12-13 DIAGNOSIS — N184 Chronic kidney disease, stage 4 (severe): Secondary | ICD-10-CM | POA: Diagnosis not present

## 2020-12-13 DIAGNOSIS — K219 Gastro-esophageal reflux disease without esophagitis: Secondary | ICD-10-CM | POA: Diagnosis not present

## 2020-12-13 DIAGNOSIS — M4316 Spondylolisthesis, lumbar region: Secondary | ICD-10-CM | POA: Diagnosis not present

## 2020-12-16 ENCOUNTER — Inpatient Hospital Stay: Payer: Medicare HMO | Admitting: Family Medicine

## 2020-12-16 ENCOUNTER — Telehealth: Payer: Self-pay | Admitting: Family Medicine

## 2020-12-16 NOTE — Telephone Encounter (Signed)
..  Home Health Certification or Plan of Care Tracking  Is this a Certification or Plan of [place]:  [facility]   Order Number:  [id number]  Has charge sheet been attached? yes  Where has form been placed:    Dr. Vonna Kotyk bin

## 2020-12-16 NOTE — Telephone Encounter (Signed)
Needs a face to face appt for home health orders, hospital follow up needs 40

## 2020-12-16 NOTE — Telephone Encounter (Signed)
..  Home Health Verbal Orders  Agency:  [facility]  Caller:  Joe Contact and title   Requesting OT/ PT/ Skilled nursing/ Social Work/ Speech:  PT  Reason for Request:  Back home out of hospital  Frequency:  2 weeks x 4                      1 week x 3   HH needs F2F w/in last 30 days

## 2020-12-16 NOTE — Telephone Encounter (Signed)
Added 2 more orders for Sherry [name] -  Order #[id number], Order [phone] and Order #

## 2020-12-16 NOTE — Telephone Encounter (Signed)
Pt scheduled for 06/10

## 2020-12-17 NOTE — Telephone Encounter (Signed)
Home health orders in your to be signed folder at the nurses station with charge sheet attached

## 2020-12-18 DIAGNOSIS — Z981 Arthrodesis status: Secondary | ICD-10-CM | POA: Diagnosis not present

## 2020-12-18 DIAGNOSIS — M4316 Spondylolisthesis, lumbar region: Secondary | ICD-10-CM | POA: Diagnosis not present

## 2020-12-18 DIAGNOSIS — Z4789 Encounter for other orthopedic aftercare: Secondary | ICD-10-CM | POA: Diagnosis not present

## 2020-12-18 DIAGNOSIS — Z6839 Body mass index (BMI) 39.0-39.9, adult: Secondary | ICD-10-CM

## 2020-12-18 DIAGNOSIS — K219 Gastro-esophageal reflux disease without esophagitis: Secondary | ICD-10-CM | POA: Diagnosis not present

## 2020-12-18 DIAGNOSIS — G2401 Drug induced subacute dyskinesia: Secondary | ICD-10-CM

## 2020-12-18 DIAGNOSIS — I130 Hypertensive heart and chronic kidney disease with heart failure and stage 1 through stage 4 chronic kidney disease, or unspecified chronic kidney disease: Secondary | ICD-10-CM | POA: Diagnosis not present

## 2020-12-18 DIAGNOSIS — N184 Chronic kidney disease, stage 4 (severe): Secondary | ICD-10-CM | POA: Diagnosis not present

## 2020-12-18 DIAGNOSIS — G8929 Other chronic pain: Secondary | ICD-10-CM | POA: Diagnosis not present

## 2020-12-18 DIAGNOSIS — F319 Bipolar disorder, unspecified: Secondary | ICD-10-CM

## 2020-12-18 DIAGNOSIS — G4700 Insomnia, unspecified: Secondary | ICD-10-CM | POA: Diagnosis not present

## 2020-12-18 DIAGNOSIS — I5030 Unspecified diastolic (congestive) heart failure: Secondary | ICD-10-CM | POA: Diagnosis not present

## 2020-12-18 DIAGNOSIS — F17210 Nicotine dependence, cigarettes, uncomplicated: Secondary | ICD-10-CM

## 2020-12-18 DIAGNOSIS — E669 Obesity, unspecified: Secondary | ICD-10-CM

## 2020-12-18 DIAGNOSIS — F419 Anxiety disorder, unspecified: Secondary | ICD-10-CM

## 2020-12-18 DIAGNOSIS — Z9181 History of falling: Secondary | ICD-10-CM

## 2020-12-18 NOTE — Telephone Encounter (Signed)
Completed orders

## 2020-12-19 ENCOUNTER — Emergency Department (HOSPITAL_BASED_OUTPATIENT_CLINIC_OR_DEPARTMENT_OTHER): Payer: Medicare HMO

## 2020-12-19 ENCOUNTER — Encounter (HOSPITAL_BASED_OUTPATIENT_CLINIC_OR_DEPARTMENT_OTHER): Payer: Self-pay | Admitting: *Deleted

## 2020-12-19 ENCOUNTER — Other Ambulatory Visit: Payer: Self-pay

## 2020-12-19 ENCOUNTER — Emergency Department (HOSPITAL_BASED_OUTPATIENT_CLINIC_OR_DEPARTMENT_OTHER)
Admission: EM | Admit: 2020-12-19 | Discharge: 2020-12-19 | Disposition: A | Discharge status: Home / Self Care | Payer: Medicare HMO | Attending: Emergency Medicine | Admitting: Emergency Medicine

## 2020-12-19 DIAGNOSIS — I5032 Chronic diastolic (congestive) heart failure: Secondary | ICD-10-CM | POA: Diagnosis not present

## 2020-12-19 DIAGNOSIS — F17210 Nicotine dependence, cigarettes, uncomplicated: Secondary | ICD-10-CM | POA: Diagnosis not present

## 2020-12-19 DIAGNOSIS — I10 Essential (primary) hypertension: Secondary | ICD-10-CM | POA: Diagnosis not present

## 2020-12-19 DIAGNOSIS — R1013 Epigastric pain: Secondary | ICD-10-CM | POA: Insufficient documentation

## 2020-12-19 DIAGNOSIS — K5730 Diverticulosis of large intestine without perforation or abscess without bleeding: Secondary | ICD-10-CM | POA: Diagnosis not present

## 2020-12-19 DIAGNOSIS — R109 Unspecified abdominal pain: Secondary | ICD-10-CM | POA: Diagnosis not present

## 2020-12-19 DIAGNOSIS — I517 Cardiomegaly: Secondary | ICD-10-CM | POA: Diagnosis not present

## 2020-12-19 DIAGNOSIS — N281 Cyst of kidney, acquired: Secondary | ICD-10-CM | POA: Diagnosis not present

## 2020-12-19 DIAGNOSIS — Z853 Personal history of malignant neoplasm of breast: Secondary | ICD-10-CM | POA: Diagnosis not present

## 2020-12-19 DIAGNOSIS — I130 Hypertensive heart and chronic kidney disease with heart failure and stage 1 through stage 4 chronic kidney disease, or unspecified chronic kidney disease: Secondary | ICD-10-CM | POA: Insufficient documentation

## 2020-12-19 DIAGNOSIS — N184 Chronic kidney disease, stage 4 (severe): Secondary | ICD-10-CM | POA: Insufficient documentation

## 2020-12-19 DIAGNOSIS — Z79899 Other long term (current) drug therapy: Secondary | ICD-10-CM | POA: Diagnosis not present

## 2020-12-19 DIAGNOSIS — R1110 Vomiting, unspecified: Secondary | ICD-10-CM | POA: Diagnosis not present

## 2020-12-19 DIAGNOSIS — I700 Atherosclerosis of aorta: Secondary | ICD-10-CM | POA: Diagnosis not present

## 2020-12-19 LAB — COMPREHENSIVE METABOLIC PANEL
ALT: 9 U/L (ref 0–44)
AST: 13 U/L — ABNORMAL LOW (ref 15–41)
Albumin: 3.6 g/dL (ref 3.5–5.0)
Alkaline Phosphatase: 80 U/L (ref 38–126)
Anion gap: 8 (ref 5–15)
BUN: 41 mg/dL — ABNORMAL HIGH (ref 8–23)
CO2: 22 mmol/L (ref 22–32)
Calcium: 9 mg/dL (ref 8.9–10.3)
Chloride: 106 mmol/L (ref 98–111)
Creatinine, Ser: 3.2 mg/dL — ABNORMAL HIGH (ref 0.44–1.00)
GFR, Estimated: 15 mL/min — ABNORMAL LOW (ref >60)
Glucose, Bld: 98 mg/dL (ref 70–99)
Potassium: 5.2 mmol/L — ABNORMAL HIGH (ref 3.5–5.1)
Sodium: 136 mmol/L (ref 135–145)
Total Bilirubin: 0.2 mg/dL — ABNORMAL LOW (ref 0.3–1.2)
Total Protein: 7 g/dL (ref 6.5–8.1)

## 2020-12-19 LAB — LIPASE, BLOOD: Lipase: 49 U/L (ref 11–51)

## 2020-12-19 LAB — CBC WITH DIFFERENTIAL/PLATELET
Abs Immature Granulocytes: 0.02 10*3/uL (ref 0.00–0.07)
Basophils Absolute: 0 10*3/uL (ref 0.0–0.1)
Basophils Relative: 1 %
Eosinophils Absolute: 0.1 10*3/uL (ref 0.0–0.5)
Eosinophils Relative: 2 %
HCT: 26 % — ABNORMAL LOW (ref 36.0–46.0)
Hemoglobin: 8.1 g/dL — ABNORMAL LOW (ref 12.0–15.0)
Immature Granulocytes: 0 %
Lymphocytes Relative: 26 %
Lymphs Abs: 1.6 10*3/uL (ref 0.7–4.0)
MCH: 28 pg (ref 26.0–34.0)
MCHC: 31.2 g/dL (ref 30.0–36.0)
MCV: 90 fL (ref 80.0–100.0)
Monocytes Absolute: 0.5 10*3/uL (ref 0.1–1.0)
Monocytes Relative: 7 %
Neutro Abs: 4.1 10*3/uL (ref 1.7–7.7)
Neutrophils Relative %: 64 %
Platelets: 304 10*3/uL (ref 150–400)
RBC: 2.89 MIL/uL — ABNORMAL LOW (ref 3.87–5.11)
RDW: 14.6 % (ref 11.5–15.5)
WBC: 6.4 10*3/uL (ref 4.0–10.5)
nRBC: 0 % (ref 0.0–0.2)

## 2020-12-19 LAB — TROPONIN I (HIGH SENSITIVITY): Troponin I (High Sensitivity): 6 ng/L (ref <18)

## 2020-12-19 MED ORDER — ALUM & MAG HYDROXIDE-SIMETH 200-200-20 MG/5ML PO SUSP
30.0000 mL | Freq: Once | ORAL | Status: AC
  Administered 2020-12-19: 30 mL via ORAL
  Filled 2020-12-19: qty 30

## 2020-12-19 MED ORDER — FENTANYL CITRATE (PF) 100 MCG/2ML IJ SOLN
50.0000 ug | Freq: Once | INTRAMUSCULAR | Status: AC
  Administered 2020-12-19: 50 ug via INTRAVENOUS
  Filled 2020-12-19: qty 2

## 2020-12-19 MED ORDER — ONDANSETRON HCL 4 MG/2ML IJ SOLN
4.0000 mg | Freq: Once | INTRAMUSCULAR | Status: AC
  Administered 2020-12-19: 4 mg via INTRAVENOUS
  Filled 2020-12-19: qty 2

## 2020-12-19 MED ORDER — LIDOCAINE VISCOUS HCL 2 % MT SOLN
15.0000 mL | Freq: Once | OROMUCOSAL | Status: AC
  Administered 2020-12-19: 15 mL via ORAL
  Filled 2020-12-19: qty 15

## 2020-12-19 NOTE — ED Notes (Signed)
Pt. Asking for more pain meds and stronger pain meds.

## 2020-12-19 NOTE — ED Provider Notes (Signed)
[facility] Provider Note   CSN: [id number] Arrival date & time: 12/19/20  1458     History Chief Complaint  Patient presents with   Abdominal Pain   Emesis    Sherry [name] is a 65 y.o. female.  Recently hospitalized for hypertensive emergency.  The history is provided by the patient.  Abdominal Pain Pain location:  Epigastric Pain quality: throbbing   Pain radiates to:  Does not radiate Pain severity:  Moderate Onset quality:  Sudden Duration:  1 day Timing:  Constant Progression:  Unchanged Chronicity:  New Context: not sick contacts, not suspicious food intake and not trauma   Relieved by:  Nothing Worsened by:  Nothing Ineffective treatments:  None tried Associated symptoms: nausea, shortness of breath and vomiting   Associated symptoms: no chest pain, no chills, no cough, no diarrhea, no dysuria, no fever, no hematuria and no sore throat   Risk factors: has not had multiple surgeries   Emesis Associated symptoms: abdominal pain   Associated symptoms: no arthralgias, no chills, no cough, no diarrhea, no fever and no sore throat       Past Medical History:  Diagnosis Date   Alcohol abuse 05/09/2019   Alcohol use disorder, severe, dependence ([place]) 08/27/2017   Alcohol withdrawal ([place]) 08/09/2017   Anxiety    ARF (acute renal failure) ([place]) 04/09/2020   Bilateral low back pain with bilateral sciatica 01/05/2019   Bipolar and related disorder ([place])    Blood transfusion without reported diagnosis    Breast cancer ([place])    right lumpectemy and lymph node    Cancer ([place]) 09/06/2017   Chest pain 05/31/2020   Chest pain, rule out acute myocardial infarction 05/29/2020   CHF (congestive heart failure) (HCC)    Chronic back pain    Chronic diastolic CHF (congestive heart failure) ([place]) 05/09/2019   Chronic kidney disease    "Stage IV" - states due to hypertension   Chronic kidney disease, stage III (moderate) ([place]) 11/17/2016   Chronic  low back pain 09/06/2017   Disc levels:      No abnormality at L2-3 or above.      L3-4:  Mild bulging of the disc.  No stenosis.      L4-5: Bilateral facet arthropathy with gaping, fluid-filled joints.   Joint edema. Anterolisthesis would likely occur at this level with   standing and flexion. Disc degeneration with mild bulging of the   disc. Mild narrowing of the lateral recesses and foramina, which   would likely worsen   Cigarette smoker 12/31/2017   CKD (chronic kidney disease) stage 4, GFR 15-29 ml/min (HCC) 04/18/2020   Cyst of ovary    Depression    Depression with anxiety 08/21/2016   Dizziness 02/24/2018   DOE (dyspnea on exertion) 12/30/2017   12/30/2017   Walked RA x one lap @ 185 stopped due to  Sob/ lightheaded weak with sats 96% at end - Spirometry 12/30/2017  FEV1 1.27 (68%)  Ratio 99 s curvature / abn effort dep portion only     Dyspnea    Dyspnea and respiratory abnormality 02/13/2013   Formatting of this note might be different from the original. STORY: PSG on 05/20/12 showed no OSA but upper airway resistance syndrome, AHI 3.9, severe snoring was noted. ESS=8. UARS. Advise positional therapy, weight loss program, regular exercise.   Essential hypertension 07/06/2017   GERD (gastroesophageal reflux disease)    Grief reaction with prolonged bereavement 09/06/2017   Partner of  18 yrs left 2018-pt admits she still loves her   Hepatitis C    recieved treatment for Hep C   Hepatitis C virus infection cured after antiviral drug therapy 12/17/2012   Telephone Encounter - Dub Mikes, RN - 12/28/2016 9:54 AM EDT Patient called for HCV RNA test results. EOT 08-26-16 - not detected. 12 week post treatment 12-11-16 - not detected. Informed patient that she was cured. Patient verbalized understanding.          History of breast cancer 02/13/2013   Hx of bipolar disorder 01/31/2016   Hypertension    Hypertensive emergency 10/21/2017   Left-sided weakness    MDD (major depressive disorder), recurrent  severe, without psychosis ([place])    Medication intolerance 09/06/2017   Remeron-appetite stimulant/Dizziness/Dysphoria   Microcytic anemia 10/21/2017   Morbid obesity due to excess calories ([place]) 02/13/2013   BMI 42.9   MRSA infection    of breast incision   Nausea 04/09/2020   Formatting of this note might be different from the original. Added automatically from request for surgery [id number]   Near syncope 05/29/2020   Neuroleptic-induced tardive dyskinesia 09/06/2017   Abilify   Opioid dependence ([place])    Has been treated in [facility] in past   Opioid use disorder, severe, dependence ([place]) 08/27/2017   Oral aphthous ulcer 06/25/2015   Last Assessment & Plan:  Formatting of this note might be different from the original. - Appear to be resolving as a be expected in the case of aphthous stomatitis. - Continue topical therapy with viscous lidocaine.  Recommended trying high-dose anti-inflammatory such as 600 mg of ibuprofen as needed. - She'll return to ENT clinic if symptoms do not resolve.   Phlebitis after infusion 10/28/2017   Rt antecubital fossa   Pre-operative cardiovascular examination 06/13/2018   Restless legs syndrome 02/13/2013   Formatting of this note might be different from the original. IMPRESSION: Possible. Will follow.   Subacute dyskinesia due to drug 02/13/2013   Formatting of this note might be different from the original. STORY: Tardive dyskinesia and mild limb dyskinesia, LE>UE which likely due to prolonged antipsychotic used, abilify. Couldn't tolerate artane, gabapentin, clonazepam and xenazine.`E1o3L`IMPRESSION: Well tolerated depakote 250mg  bid, mood aspect is better as well. Will increase to 500mg  bid. ADR was discussed.  RTC 4-6 weeks.   Substance abuse ([place])    Substance induced mood disorder ([place]) 08/13/2017   TIA (transient ischemic attack) 2020   P/w BP >230/120 and neurologic symptoms, presumed TIA    Patient Active Problem List   Diagnosis Date Noted    Spondylolisthesis, lumbar region 10/21/2020   Anxiety    Bipolar and related disorder ([place])    Blood transfusion without reported diagnosis    Breast cancer ([place])    CHF (congestive heart failure) (HCC)    Chronic back pain    Chronic kidney disease    Depression    Dyspnea    Hepatitis C    MRSA infection    Opioid dependence ([place])    Substance abuse ([place])    Chest pain 05/31/2020   Near syncope 05/29/2020   Chest pain, rule out acute myocardial infarction 05/29/2020   CKD (chronic kidney disease) stage 4, GFR 15-29 ml/min ([place]) 04/18/2020   ARF (acute renal failure) ([place]) 04/09/2020   Nausea 04/09/2020   Chronic diastolic CHF (congestive heart failure) ([place]) 05/09/2019   GERD (gastroesophageal reflux disease) 05/09/2019   Alcohol abuse 05/09/2019   Hypertension 05/08/2019   Bilateral low back pain  with bilateral sciatica 01/05/2019   TIA (transient ischemic attack) 2020   Cyst of ovary    Pre-operative cardiovascular examination 06/13/2018   Dizziness 02/24/2018   Left-sided weakness 02/24/2018   Cigarette smoker 12/31/2017   DOE (dyspnea on exertion) 12/30/2017   Phlebitis after infusion 10/28/2017   Hypertensive emergency 10/21/2017   Microcytic anemia 10/21/2017   Medication intolerance 09/06/2017   Neuroleptic-induced tardive dyskinesia 09/06/2017   Cancer ([place]) 09/06/2017   Chronic low back pain 09/06/2017   Grief reaction with prolonged bereavement 09/06/2017   Opioid use disorder, severe, dependence ([place]) 08/27/2017   Alcohol use disorder, severe, dependence ([place]) 08/27/2017   Substance induced mood disorder ([place]) 08/13/2017   MDD (major depressive disorder), recurrent severe, without psychosis ([place])    Alcohol withdrawal ([place]) 08/09/2017   Essential hypertension 07/06/2017   Chronic kidney disease, stage III (moderate) ([place]) 11/17/2016   Depression with anxiety 08/21/2016   Hx of bipolar disorder 01/31/2016   Oral aphthous ulcer 06/25/2015   History of  breast cancer 02/13/2013   Morbid obesity due to excess calories ([place]) 02/13/2013   Restless legs syndrome 02/13/2013   Subacute dyskinesia due to drug 02/13/2013   Dyspnea and respiratory abnormality 02/13/2013   Hepatitis C virus infection cured after antiviral drug therapy 12/17/2012    Past Surgical History:  Procedure Laterality Date   BACK SURGERY  1990   BREAST SURGERY Right 2010   "breast cancer survivor" - states partial mastectomy and nodes   COLONOSCOPY     [facility]   ESOPHAGOGASTRODUODENOSCOPY     [facility]   ESOPHAGOGASTRODUODENOSCOPY  04/18/2020   [facility]   LAPAROSCOPIC CHOLECYSTECTOMY  2002   LAPAROSCOPIC INCISIONAL / UMBILICAL / [place]  04/13/2018   with BARD 15x 24OX mesh (supraumbilical)   LAPAROSCOPIC LYSIS OF ADHESIONS  07/12/2018   Procedure: LAPAROSCOPIC LYSIS OF ADHESIONS;  Surgeon: Isabel Caprice, MD;  Location: [facility];  Service: Gynecology;;   MULTIPLE TOOTH EXTRACTIONS     MYOMECTOMY     x 2 prior to hysterectomy   ROBOTIC ASSISTED BILATERAL SALPINGO OOPHERECTOMY Right 07/12/2018   Procedure: XI ROBOTIC ASSISTED RIGHT SALPINGO OOPHORECTOMY;  Surgeon: Isabel Caprice, MD;  Location: [facility];  Service: Gynecology;  Laterality: Right;   TOTAL ABDOMINAL HYSTERECTOMY     fibroids    UPPER GASTROINTESTINAL ENDOSCOPY     WISDOM TOOTH EXTRACTION       OB History     Gravida  2   Para  0   Term      Preterm      AB  0   Living         SAB      IAB      Ectopic      Multiple      Live Births              Family History  Problem Relation Age of Onset   Heart attack Mother    Breast cancer Mother 10   Dementia Mother    Cancer Father 63       died of bleeding from kidneys   Colon cancer Neg Hx    Esophageal cancer Neg Hx    Stomach cancer Neg Hx    Rectal cancer Neg Hx     Social History   Tobacco Use   Smoking status: Every Day    Packs/day: 1.00    Pack years: 0.00    Types:  Cigarettes  Smokeless tobacco: Never  Vaping Use   Vaping Use: Former   Start date: 06/27/2013   Quit date: 06/14/2017   Devices: Apple Cinnamon-0 mg  Substance Use Topics   Alcohol use: Not Currently    Comment: hx over 30 years   Drug use: Not Currently    Comment: h/o IVD use    Home Medications Prior to Admission medications   Medication Sig Start Date End Date Taking? Authorizing Provider  amLODipine (NORVASC) 10 MG tablet TAKE 1 TABLET BY MOUTH DAILY Patient taking differently: Take 10 mg by mouth in the morning. 09/26/20   Wendie Agreste, MD  Cholecalciferol (VITAMIN D3) 50 MCG (2000 UT) capsule Take 1 capsule (2,000 Units total) by mouth daily. Patient taking differently: Take 2,000 Units by mouth in the morning. 09/26/20   Wendie Agreste, MD  diphenhydramine-acetaminophen (TYLENOL PM) 25-500 MG TABS tablet Take 1 tablet by mouth at bedtime.    [provider]  docusate sodium (COLACE) 100 MG capsule Take 1 capsule (100 mg total) by mouth 2 (two) times daily. 10/23/20   Viona Gilmore D, NP  FEROSUL 325 (65 Fe) MG tablet TAKE 1 TABLET BY MOUTH DAILY WITH BREAKFAST Patient taking differently: Take 325 mg by mouth daily with breakfast. 09/25/20   Wendie Agreste, MD  FLUoxetine (PROZAC) 40 MG capsule Take 40 mg by mouth in the morning. 11/09/18   [provider]  gabapentin (NEURONTIN) 400 MG capsule Take 2 capsules (800 mg total) by mouth at bedtime. 10/23/20   Viona Gilmore D, NP  hydrALAZINE (APRESOLINE) 100 MG tablet Take 100 mg by mouth 3 (three) times daily. 09/26/20   [provider]  INGREZZA 80 MG CAPS Take 80 mg by mouth at bedtime. 10/15/17   [provider]  isosorbide mononitrate (IMDUR) 60 MG 24 hr tablet TAKE 1 TABLET BY MOUTH DAILY Patient taking differently: Take 60 mg by mouth in the morning. 09/25/20   Wendie Agreste, MD  labetalol (NORMODYNE) 300 MG tablet Take 300 mg by mouth 2 (two) times daily.    [provider]  lidocaine (LIDODERM) 5 % Place 1 patch onto the skin daily. Remove & Discard patch within 12 hours or as directed by MD 10/10/20   Nuala Alpha A, PA-C  melatonin 5 MG TABS Take 5 mg by mouth at bedtime.    [provider]  methocarbamol (ROBAXIN) 500 MG tablet Take 1-2 tablets (500-1,000 mg total) by mouth every 6 (six) hours as needed for muscle spasms. 10/23/20   Bergman, Trinda Pascal D, NP  ondansetron (ZOFRAN) 4 MG tablet TAKE 1 TABLET BY MOUTH EVERY 8 HOURS AS NEEDED FOR NAUSEA OR VOMITING Patient taking differently: Take 4 mg by mouth every 8 (eight) hours as needed for nausea or vomiting. 01/12/20   Wendie Agreste, MD  oxyCODONE 10 MG TABS Take 1 tablet (10 mg total) by mouth every 4 (four) hours as needed for severe pain ((score 7 to 10)). 10/23/20   Viona Gilmore D, NP  pantoprazole (PROTONIX) 20 MG tablet Take 1 tablet (20 mg total) by mouth 2 (two) times daily. 06/01/20   Aline August, MD  sodium bicarbonate 650 MG tablet Take 650 mg by mouth 2 (two) times daily. 09/04/20   [provider]    Allergies    Abilify [aripiprazole], Remeron [mirtazapine], Trazodone and nefazodone, Flexeril [cyclobenzaprine], and Amoxicillin  Review of Systems   Review of Systems  Constitutional:  Negative for chills and fever.  HENT:  Negative for ear pain and sore throat.   Eyes:  Negative for pain and visual disturbance.  Respiratory:  Positive for shortness of breath. Negative for cough.   Cardiovascular:  Negative for chest pain and palpitations.  Gastrointestinal:  Positive for abdominal pain, nausea and vomiting. Negative for diarrhea.  Genitourinary:  Negative for dysuria and hematuria.  Musculoskeletal:  Negative for arthralgias and back pain.  Skin:  Negative for color change and rash.  Neurological:  Negative for seizures and syncope.  All other systems reviewed and are negative.  Physical Exam Updated Vital Signs BP (!) 175/84 (BP Location: Left Arm)    Pulse 63   Temp 98.6 F (37 C) (Oral)   Resp (!) 22   Ht 5\' 2"  (1.575 m)   Wt 100.7 kg   SpO2 100%   BMI 40.60 kg/m   Physical Exam Vitals and nursing note reviewed.  Constitutional:      General: She is not in acute distress.    Appearance: She is well-developed.  HENT:     Head: Normocephalic and atraumatic.  Eyes:     Conjunctiva/sclera: Conjunctivae normal.  Cardiovascular:     Rate and Rhythm: Normal rate and regular rhythm.     Heart sounds: No murmur heard. Pulmonary:     Effort: Pulmonary effort is normal. No respiratory distress.     Breath sounds: Normal breath sounds.  Abdominal:     Palpations: Abdomen is soft.     Tenderness: There is abdominal tenderness in the epigastric area.  Musculoskeletal:     Cervical back: Neck supple.  Skin:    General: Skin is warm and dry.  Neurological:     General: No focal deficit present.     Mental Status: She is alert and oriented to person, place, and time.  Psychiatric:        Mood and Affect: Mood normal.    ED Results / Procedures / Treatments   Labs (all labs ordered are listed, but only abnormal results are displayed) Labs Reviewed  COMPREHENSIVE METABOLIC PANEL - Abnormal; Notable for the following components:      Result Value   Potassium 5.2 (*)    BUN 41 (*)    Creatinine, Ser 3.20 (*)    AST 13 (*)    Total Bilirubin 0.2 (*)    GFR, Estimated 15 (*)    All other components within normal limits  CBC WITH DIFFERENTIAL/PLATELET - Abnormal; Notable for the following components:   RBC 2.89 (*)    Hemoglobin 8.1 (*)    HCT 26.0 (*)    All other components within normal limits  LIPASE, BLOOD  TROPONIN I (HIGH SENSITIVITY)  TROPONIN I (HIGH SENSITIVITY)    EKG EKG Interpretation  Date/Time:  Thursday December 19 2020 16:52:29 EDT Ventricular Rate:  65 PR Interval:  131 QRS Duration: 97 QT Interval:  452 QTC Calculation: 470 R Axis:   35 Text Interpretation: Sinus rhythm Normal axis No acute  ischemia Confirmed by Lorre Munroe ([id number]) on 12/19/2020 5:07:13 PM  Radiology CT Abdomen Pelvis Wo Contrast  Result Date: 12/19/2020 CLINICAL DATA:  Epigastric pain and vomiting. EXAM: CT ABDOMEN AND PELVIS WITHOUT CONTRAST TECHNIQUE: Multidetector CT imaging of the abdomen and pelvis was performed following the standard protocol without IV contrast. COMPARISON:  March 30, 2020 FINDINGS: Lower chest: There is mild to moderate severity cardiomegaly. No acute abnormality. Hepatobiliary: No focal liver abnormality is seen. Status post cholecystectomy. No biliary dilatation. Pancreas: Unremarkable. No pancreatic ductal  dilatation or surrounding inflammatory changes. Spleen: Normal in size without focal abnormality. Adrenals/Urinary Tract: A 2.5 cm x 1.7 cm low-attenuation left adrenal mass is seen. A similar appearing 1.5 cm x 0.8 cm low-attenuation right adrenal mass is noted. Kidneys are normal in size, without obstructing renal calculi or hydronephrosis. Multiple bilateral renal cysts are seen. The largest measures 4.7 cm x 3.5 cm and is located within the posterior aspect of the mid left kidney. Bladder is unremarkable. Stomach/Bowel: Stomach is within normal limits. Appendix appears normal. No evidence of bowel wall thickening, distention, or inflammatory changes. Noninflamed diverticula are seen within the sigmoid colon. Vascular/Lymphatic: Aortic atherosclerosis. No enlarged abdominal or pelvic lymph nodes. Reproductive: Status post hysterectomy. No adnexal masses. Other: Multiple metallic density surgical coils are seen along the anterior abdominal wall. No abdominopelvic ascites. Musculoskeletal: Bilateral metallic density pedicle screws are seen at the levels of L4 and L5 vertebral bodies. Additional postoperative changes are seen within the L4-L5 intervertebral disc space. IMPRESSION: 1. Evidence of prior cholecystectomy. 2. Sigmoid diverticulosis. 3. Bilateral renal cysts. 4. Stable bilateral adrenal  adenomas. 5. Postoperative changes within the lower lumbar spine. Electronically Signed   By: Virgina Norfolk M.D.   On: 12/19/2020 17:43   DG Chest Port 1 View  Result Date: 12/19/2020 CLINICAL DATA:  Abdominal pain and vomiting. EXAM: PORTABLE CHEST 1 VIEW COMPARISON:  Dec 08, 2020 FINDINGS: Multiple radiopaque cardiac lead wires are seen overlying the left lung. The cardiac silhouette is moderately enlarged. Both lungs are clear. Radiopaque surgical clips are seen overlying the right axilla. A radiopaque fusion plate and screws are seen overlying the cervical spine. The visualized skeletal structures are otherwise unremarkable. IMPRESSION: Stable cardiomegaly without acute or active cardiopulmonary disease. Electronically Signed   By: Virgina Norfolk M.D.   On: 12/19/2020 17:38    Procedures Procedures   Medications Ordered in ED Medications  alum & mag hydroxide-simeth (MAALOX/MYLANTA) 200-200-20 MG/5ML suspension 30 mL (has no administration in time range)    And  lidocaine (XYLOCAINE) 2 % viscous mouth solution 15 mL (has no administration in time range)  ondansetron (ZOFRAN) injection 4 mg (4 mg Intravenous Given 12/19/20 1800)  fentaNYL (SUBLIMAZE) injection 50 mcg (50 mcg Intravenous Given 12/19/20 1800)    ED Course  I have reviewed the triage vital signs and the nursing notes.  Pertinent labs & imaging results that were available during my care of the patient were reviewed by me and considered in my medical decision making (see chart for details).    MDM Rules/Calculators/A&P                          Maricsa Sammons is 65 years old with numerous chronic medical conditions.  She presents with several days of epigastric pain, nausea, and vomiting.  She was noted to be hypertensive which is in keeping with her history of difficult to control blood pressure.  Otherwise, her exam was reassuring.  She was evaluated for evidence of cardiopulmonary pathology such as acute coronary syndrome,  pneumonia, or other emergent condition.  Her initial troponin was normal, and she declined a second time.  I counseled her that we check a second troponin to make sure she is not having any signs of a heart attack.  She voiced understanding, and she declined further testing.  I do think she possess the capacity to make this decision.  She was also evaluated for evidence of pancreatitis, bowel obstruction, or other emergent intra-abdominal  issue.  Work-up was reassuring.  I think it is likely that she is experiencing symptoms from gastritis.  I advised her to follow-up with her doctor. Final Clinical Impression(s) / ED Diagnoses Final diagnoses:  Epigastric pain  Primary hypertension    Rx / DC Orders ED Discharge Orders     None        Arnaldo Natal, MD 12/19/20 2012

## 2020-12-19 NOTE — ED Notes (Signed)
Pt. Reports to RN she is having abd. Pain in the upper part of her abd.  Pt. In no distress just very quiet and asking for pain meds.

## 2020-12-19 NOTE — ED Notes (Signed)
Patient transported to CT 

## 2020-12-19 NOTE — ED Triage Notes (Signed)
Abdominal pain and vomiting since yesterday.  

## 2020-12-20 ENCOUNTER — Encounter: Payer: Self-pay | Admitting: Family Medicine

## 2020-12-20 ENCOUNTER — Ambulatory Visit (INDEPENDENT_AMBULATORY_CARE_PROVIDER_SITE_OTHER): Payer: Medicare HMO | Admitting: Family Medicine

## 2020-12-20 VITALS — Temp 98.2°F | Ht 62.0 in | Wt 213.0 lb

## 2020-12-20 DIAGNOSIS — I5030 Unspecified diastolic (congestive) heart failure: Secondary | ICD-10-CM | POA: Diagnosis not present

## 2020-12-20 DIAGNOSIS — R109 Unspecified abdominal pain: Secondary | ICD-10-CM

## 2020-12-20 DIAGNOSIS — I10 Essential (primary) hypertension: Secondary | ICD-10-CM

## 2020-12-20 DIAGNOSIS — N184 Chronic kidney disease, stage 4 (severe): Secondary | ICD-10-CM | POA: Diagnosis not present

## 2020-12-20 DIAGNOSIS — J810 Acute pulmonary edema: Secondary | ICD-10-CM

## 2020-12-20 DIAGNOSIS — I5032 Chronic diastolic (congestive) heart failure: Secondary | ICD-10-CM | POA: Diagnosis not present

## 2020-12-20 DIAGNOSIS — G8929 Other chronic pain: Secondary | ICD-10-CM

## 2020-12-20 DIAGNOSIS — Z981 Arthrodesis status: Secondary | ICD-10-CM | POA: Diagnosis not present

## 2020-12-20 DIAGNOSIS — E875 Hyperkalemia: Secondary | ICD-10-CM | POA: Diagnosis not present

## 2020-12-20 DIAGNOSIS — N189 Chronic kidney disease, unspecified: Secondary | ICD-10-CM | POA: Diagnosis not present

## 2020-12-20 DIAGNOSIS — R42 Dizziness and giddiness: Secondary | ICD-10-CM | POA: Diagnosis not present

## 2020-12-20 DIAGNOSIS — G4700 Insomnia, unspecified: Secondary | ICD-10-CM | POA: Diagnosis not present

## 2020-12-20 DIAGNOSIS — M545 Low back pain, unspecified: Secondary | ICD-10-CM | POA: Diagnosis not present

## 2020-12-20 DIAGNOSIS — M4316 Spondylolisthesis, lumbar region: Secondary | ICD-10-CM | POA: Diagnosis not present

## 2020-12-20 DIAGNOSIS — D631 Anemia in chronic kidney disease: Secondary | ICD-10-CM

## 2020-12-20 DIAGNOSIS — K219 Gastro-esophageal reflux disease without esophagitis: Secondary | ICD-10-CM | POA: Diagnosis not present

## 2020-12-20 DIAGNOSIS — I130 Hypertensive heart and chronic kidney disease with heart failure and stage 1 through stage 4 chronic kidney disease, or unspecified chronic kidney disease: Secondary | ICD-10-CM | POA: Diagnosis not present

## 2020-12-20 DIAGNOSIS — Z4789 Encounter for other orthopedic aftercare: Secondary | ICD-10-CM | POA: Diagnosis not present

## 2020-12-20 DIAGNOSIS — Z7409 Other reduced mobility: Secondary | ICD-10-CM | POA: Diagnosis not present

## 2020-12-20 MED ORDER — HYOSCYAMINE SULFATE 0.125 MG PO TABS: 0.1250 mg | ORAL_TABLET | Freq: Four times a day (QID) | ORAL | 0 refills | Status: DC | PRN

## 2020-12-20 MED ORDER — GABAPENTIN 400 MG PO CAPS: 400.0000 mg | ORAL_CAPSULE | Freq: Two times a day (BID) | ORAL | 0 refills | Status: DC

## 2020-12-20 NOTE — Patient Instructions (Addendum)
Please call  your back surgeon to discuss back pain and the current meds not being effective to discuss options on treatment.   In October of 2021, Dr. Dorrene German (gastroenterologist) recommended follow up if not improving - please call his office [phone] to discuss next step for your abdominal pain.  Can try hyoscamine as recommended by gastroenterology in the past - up to every 6 hours. Stop if new side effects or not helping. If no relief, may need to meet with pain management as had been discussed in the past. Stay on protonix twice per day for now.   Dizziness may be from a number of causes - your anemia, pain medication, and other meds. Make sure you are drinking sufficient fluids. See info on fall prevention in the home.   Recheck for lab visit in 5 days for potassium and anemia. If any new or worsening fatigue or dizziness - be seen sooner or in ER.   Return to the clinic or go to the nearest emergency room if any of your symptoms worsen or new symptoms occur.   Fall Prevention in the Home, Adult Falls can cause injuries and can affect people from all age groups. There are many simple things that you can do to make your home safe and to help preventfalls. Ask for help when making these changes, if needed. What actions can I take to prevent falls? General instructions Use good lighting in all rooms. Replace any light bulbs that burn out. Turn on lights if it is dark. Use night-lights. Place frequently used items in easy-to-reach places. Lower the shelves around your home if necessary. Set up furniture so that there are clear paths around it. Avoid moving your furniture around. Remove throw rugs and other tripping hazards from the floor. Avoid walking on wet floors. Fix any uneven floor surfaces. Add color or contrast paint or tape to grab bars and handrails in your home. Place contrasting color strips on the first and last steps of stairways. When you use a stepladder, make sure that  it is completely opened and that the sides are firmly locked. Have someone hold the ladder while you are using it. Do not climb a closed stepladder. Be aware of any and all pets. What can I do in the bathroom?     Keep the floor dry. Immediately clean up any water that spills onto the floor. Remove soap buildup in the tub or shower on a regular basis. Use non-skid mats or decals on the floor of the tub or shower. Attach bath mats securely with double-sided, non-slip rug tape. If you need to sit down while you are in the shower, use a plastic, non-slip stool. Install grab bars by the toilet and in the tub and shower. Do not use towel bars as grab bars. What can I do in the bedroom? Make sure that a bedside light is easy to reach. Do not use oversized bedding that drapes onto the floor. Have a firm chair that has side arms to use for getting dressed. What can I do in the kitchen? Clean up any spills right away. If you need to reach for something above you, use a sturdy step stool that has a grab bar. Keep electrical cables out of the way. Do not use floor polish or wax that makes floors slippery. If you must use wax, make sure that it is non-skid floor wax. What can I do in the stairways? Do not leave any items on the stairs. Make  sure that you have a light switch at the top of the stairs and the bottom of the stairs. Have them installed if you do not have them. Make sure that there are handrails on both sides of the stairs. Fix handrails that are broken or loose. Make sure that handrails are as long as the stairways. Install non-slip stair treads on all stairs in your home. Avoid having throw rugs at the top or bottom of stairways, or secure the rugs with carpet tape to prevent them from moving. Choose a carpet design that does not hide the edge of steps on the stairway. Check any carpeting to make sure that it is firmly attached to the stairs. Fix any carpet that is loose or worn. What  can I do on the outside of my home? Use bright outdoor lighting. Regularly repair the edges of walkways and driveways and fix any cracks. Remove high doorway thresholds. Trim any shrubbery on the main path into your home. Regularly check that handrails are securely fastened and in good repair. Both sides of any steps should have handrails. Install guardrails along the edges of any raised decks or porches. Clear walkways of debris and clutter, including tools and rocks. Have leaves, snow, and ice cleared regularly. Use sand or salt on walkways during winter months. In the garage, clean up any spills right away, including grease or oil spills. What other actions can I take? Wear closed-toe shoes that fit well and support your feet. Wear shoes that have rubber soles or low heels. Use mobility aids as needed, such as canes, walkers, scooters, and crutches. Review your medicines with your health care provider. Some medicines can cause dizziness or changes in blood pressure, which increase your risk of falling. Talk with your health care provider about other ways that you can decrease your risk of falls. This may include working with a physical therapist or trainer toimprove your strength, balance, and endurance. Where to find more information Centers for Disease Control and Prevention, [employer]: WebmailGuide.co.za [employer] on Aging: BrainJudge.co.uk Contact a health care provider if: You are afraid of falling at home. You feel weak, drowsy, or dizzy at home. You fall at home. Summary There are many simple things that you can do to make your home safe and to help prevent falls. Ways to make your home safe include removing tripping hazards and installing grab bars in the bathroom. Ask for help when making these changes in your home. This information is not intended to replace advice given to you by your health care provider. Make sure you discuss any questions you have with  your healthcare provider. Document Revised: 06/11/2017 Document Reviewed: 02/11/2017 [employer]vier Patient Education  2021 [employer].

## 2020-12-20 NOTE — Progress Notes (Signed)
Subjective:  Patient ID: Sherry [name], female    DOB: [birth year]  Age: 65 y.o. MRN: [id number]  CC:  Chief Complaint  Patient presents with   Hospitalization Follow-up    Pt here for follow up of hospital stay, notes standing causes dizziness, back pain    HPI Sherry [name] presents for  Hospital follow-up.  Impaired functional mobility balance gait endurance, shortness of breath, pulmonary edema Admitted to [facility] May 29 through June 1.  Presented due to shortness of breath and chest pain.  Was found to be bradycardic with pulmonary edema.  Evaluated by cardiology, recommended outpatient OSA screening.  Resumed on labetalol as heart rate normalized.  Echo with EF 65% without wall motion abnormality.  She was diuresed for pulmonary edema and titrated off of oxygen.  Antihypertensives were adjusted and 1 week follow-up recommended.  History of CKD stage IV, followed by nephrology, baseline creatinine 3.4-3.5 per hospital records that remained stable.  She did have a fall during hospitalization that was witnessed.  Pelvic x-ray negative for fractures.  Anemia of chronic disease stable during hospitalization.  Other comorbid chronic conditions remained stable.  Due to difficulty with mobility, I did complete orders yesterday for home health evaluation and treatment. Uses rolling walker. Has repeat home health visit today. Only 1 visit so far.   On chart review she was evaluated yesterday through [facility] emergency room for abdominal pain and emesis. She did present hypertensive at that time but has had history of difficult to control hypertension (multiple med adjustments with nephrology and concern of adherence prior).  Creatinine 3.2 at that time, potassium 5.2, hemoglobin 8.1.  Initial troponin was normal, declined repeat troponin.  Lipase was normal.  Normal WBC on CBC.  Thought to be experiencing symptoms from gastritis.  Outpatient follow-up  planned. Of note on chart review her hemoglobin was 8.2 on June 1 through [facility], creatinine 3.78, potassium 4.7.  She does have a history of chronic abdominal pain, back pain, underwent lumbar spinal fusion in April, Dr. Arnoldo Morale.  Today c/o upper abdominal pain - same pain as in past, with her chronic abd pain - persistent throbbing pain. No fever/n/v. No blood in stools or dark stools. No CP, no dyspnea. Protonix 20mg  BID. Prior pt of Dr. Bryan Lemma with GI. Last appt 02/2020. Prior endoscopy negative for acute pathology - unable to take phenergan/Reglan due to tardive dyskinesia. Pain mgt recommended by GI locally. Saw GI - Dr. Dorrene German at [facility].  Priro stomach biopsy with mild inflammation, no infection. Continued on PPI.telephone note 04/25/20: "I informed her the stomach biopsies showed some mild inflammation, but no infection. I told that she should heal with the pantoprazole she was taking. I recommended she try FD gard which she said she had tried that in the past and it had not helped. I recommended she try a probiotic such as Electronics engineer. I told her if the symptoms were not improving over the next several weeks, to call back and make an appointment with our office"  [place] - no relief  Not sure she took levsin Rx in 2021.  History of polysubstance use. Taking oxycodone every 6 hours - taking for back. Helping some, later states not helping - has not discussed with surgeon.  Feels like these are not helping stomach - "like candy".  Normal BM - last one yesterday.  Referred to pain mgt in past  - had received call in  04/2020, but had not scheduled appt.  Seen through behavioral health in 11-06/2020 for opiod use disorder. Still going to NA meetings weekly, going ok. sponsor - Daneil Dan.  Some lightheaded feeling with standing - past 6 months - no recent changes.   Blood pressure better controlled at home - 130/80 range. No missed doses of amlodipine, labetalol, isosorbide,  hydralazine.   At end of visit - requests refill of gabapentin - now taking 400mg  BID.  I've requested another appointment to review this further, but temporary refill given for now.    BP Readings from Last 3 Encounters:  12/19/20 (!) 183/86  10/23/20 (!) 149/55  10/17/20 (!) 158/67     History Patient Active Problem List   Diagnosis Date Noted   Spondylolisthesis, lumbar region 10/21/2020   Anxiety    Bipolar and related disorder ([place])    Blood transfusion without reported diagnosis    Breast cancer ([place])    CHF (congestive heart failure) ([place])    Chronic back pain    Chronic kidney disease    Depression    Dyspnea    Hepatitis C    MRSA infection    Opioid dependence ([place])    Substance abuse ([place])    Chest pain 05/31/2020   Near syncope 05/29/2020   Chest pain, rule out acute myocardial infarction 05/29/2020   CKD (chronic kidney disease) stage 4, GFR 15-29 ml/min (HCC) 04/18/2020   ARF (acute renal failure) ([place]) 04/09/2020   Nausea 04/09/2020   Chronic diastolic CHF (congestive heart failure) ([place]) 05/09/2019   GERD (gastroesophageal reflux disease) 05/09/2019   Alcohol abuse 05/09/2019   Hypertension 05/08/2019   Bilateral low back pain with bilateral sciatica 01/05/2019   TIA (transient ischemic attack) 2020   Cyst of ovary    Pre-operative cardiovascular examination 06/13/2018   Dizziness 02/24/2018   Left-sided weakness 02/24/2018   Cigarette smoker 12/31/2017   DOE (dyspnea on exertion) 12/30/2017   Phlebitis after infusion 10/28/2017   Hypertensive emergency 10/21/2017   Microcytic anemia 10/21/2017   Medication intolerance 09/06/2017   Neuroleptic-induced tardive dyskinesia 09/06/2017   Cancer ([place]) 09/06/2017   Chronic low back pain 09/06/2017   Grief reaction with prolonged bereavement 09/06/2017   Opioid use disorder, severe, dependence ([place]) 08/27/2017   Alcohol use disorder, severe, dependence ([place]) 08/27/2017   Substance induced mood disorder  ([place]) 08/13/2017   MDD (major depressive disorder), recurrent severe, without psychosis ([place])    Alcohol withdrawal ([place]) 08/09/2017   Essential hypertension 07/06/2017   Chronic kidney disease, stage III (moderate) ([place]) 11/17/2016   Depression with anxiety 08/21/2016   Hx of bipolar disorder 01/31/2016   Oral aphthous ulcer 06/25/2015   History of breast cancer 02/13/2013   Morbid obesity due to excess calories ([place]) 02/13/2013   Restless legs syndrome 02/13/2013   Subacute dyskinesia due to drug 02/13/2013   Dyspnea and respiratory abnormality 02/13/2013   Hepatitis C virus infection cured after antiviral drug therapy 12/17/2012   Past Medical History:  Diagnosis Date   Alcohol abuse 05/09/2019   Alcohol use disorder, severe, dependence ([place]) 08/27/2017   Alcohol withdrawal ([place]) 08/09/2017   Anxiety    ARF (acute renal failure) ([place]) 04/09/2020   Bilateral low back pain with bilateral sciatica 01/05/2019   Bipolar and related disorder ([place])    Blood transfusion without reported diagnosis    Breast cancer ([place])    right lumpectemy and lymph node    Cancer ([place]) 09/06/2017   Chest pain 05/31/2020   Chest  pain, rule out acute myocardial infarction 05/29/2020   CHF (congestive heart failure) (HCC)    Chronic back pain    Chronic diastolic CHF (congestive heart failure) ([place]) 05/09/2019   Chronic kidney disease    "Stage IV" - states due to hypertension   Chronic kidney disease, stage III (moderate) ([place]) 11/17/2016   Chronic low back pain 09/06/2017   Disc levels:      No abnormality at L2-3 or above.      L3-4:  Mild bulging of the disc.  No stenosis.      L4-5: Bilateral facet arthropathy with gaping, fluid-filled joints.   Joint edema. Anterolisthesis would likely occur at this level with   standing and flexion. Disc degeneration with mild bulging of the   disc. Mild narrowing of the lateral recesses and foramina, which   would likely worsen   Cigarette smoker 12/31/2017   CKD  (chronic kidney disease) stage 4, GFR 15-29 ml/min (HCC) 04/18/2020   Cyst of ovary    Depression    Depression with anxiety 08/21/2016   Dizziness 02/24/2018   DOE (dyspnea on exertion) 12/30/2017   12/30/2017   Walked RA x one lap @ 185 stopped due to  Sob/ lightheaded weak with sats 96% at end - Spirometry 12/30/2017  FEV1 1.27 (68%)  Ratio 99 s curvature / abn effort dep portion only     Dyspnea    Dyspnea and respiratory abnormality 02/13/2013   Formatting of this note might be different from the original. STORY: PSG on 05/20/12 showed no OSA but upper airway resistance syndrome, AHI 3.9, severe snoring was noted. ESS=8. UARS. Advise positional therapy, weight loss program, regular exercise.   Essential hypertension 07/06/2017   GERD (gastroesophageal reflux disease)    Grief reaction with prolonged bereavement 09/06/2017   Partner of 18 yrs left 2018-pt admits she still loves her   Hepatitis C    recieved treatment for Hep C   Hepatitis C virus infection cured after antiviral drug therapy 12/17/2012   Telephone Encounter - Dub Mikes, RN - 12/28/2016 9:54 AM EDT Patient called for HCV RNA test results. EOT 08-26-16 - not detected. 12 week post treatment 12-11-16 - not detected. Informed patient that she was cured. Patient verbalized understanding.          History of breast cancer 02/13/2013   Hx of bipolar disorder 01/31/2016   Hypertension    Hypertensive emergency 10/21/2017   Left-sided weakness    MDD (major depressive disorder), recurrent severe, without psychosis ([place])    Medication intolerance 09/06/2017   Remeron-appetite stimulant/Dizziness/Dysphoria   Microcytic anemia 10/21/2017   Morbid obesity due to excess calories ([place]) 02/13/2013   BMI 42.9   MRSA infection    of breast incision   Nausea 04/09/2020   Formatting of this note might be different from the original. Added automatically from request for surgery [id number]   Near syncope 05/29/2020   Neuroleptic-induced tardive dyskinesia  09/06/2017   Abilify   Opioid dependence ([place])    Has been treated in [facility] in past   Opioid use disorder, severe, dependence ([place]) 08/27/2017   Oral aphthous ulcer 06/25/2015   Last Assessment & Plan:  Formatting of this note might be different from the original. - Appear to be resolving as a be expected in the case of aphthous stomatitis. - Continue topical therapy with viscous lidocaine.  Recommended trying high-dose anti-inflammatory such as 600 mg of ibuprofen as needed. - She'll return to ENT clinic if  symptoms do not resolve.   Phlebitis after infusion 10/28/2017   Rt antecubital fossa   Pre-operative cardiovascular examination 06/13/2018   Restless legs syndrome 02/13/2013   Formatting of this note might be different from the original. IMPRESSION: Possible. Will follow.   Subacute dyskinesia due to drug 02/13/2013   Formatting of this note might be different from the original. STORY: Tardive dyskinesia and mild limb dyskinesia, LE>UE which likely due to prolonged antipsychotic used, abilify. Couldn't tolerate artane, gabapentin, clonazepam and xenazine.`E1o3L`IMPRESSION: Well tolerated depakote 250mg  bid, mood aspect is better as well. Will increase to 500mg  bid. ADR was discussed.  RTC 4-6 weeks.   Substance abuse ([place])    Substance induced mood disorder ([place]) 08/13/2017   TIA (transient ischemic attack) 2020   P/w BP >230/120 and neurologic symptoms, presumed TIA   Past Surgical History:  Procedure Laterality Date   BACK SURGERY  1990   BREAST SURGERY Right 2010   "breast cancer survivor" - states partial mastectomy and nodes   COLONOSCOPY     [facility]   ESOPHAGOGASTRODUODENOSCOPY     [facility]   ESOPHAGOGASTRODUODENOSCOPY  04/18/2020   [facility]   LAPAROSCOPIC CHOLECYSTECTOMY  2002   LAPAROSCOPIC INCISIONAL / UMBILICAL / [place]  04/13/2018   with BARD 15x 96PR mesh (supraumbilical)   LAPAROSCOPIC LYSIS OF ADHESIONS  07/12/2018    Procedure: LAPAROSCOPIC LYSIS OF ADHESIONS;  Surgeon: Isabel Caprice, MD;  Location: [facility];  Service: Gynecology;;   MULTIPLE TOOTH EXTRACTIONS     MYOMECTOMY     x 2 prior to hysterectomy   ROBOTIC ASSISTED BILATERAL SALPINGO OOPHERECTOMY Right 07/12/2018   Procedure: XI ROBOTIC ASSISTED RIGHT SALPINGO OOPHORECTOMY;  Surgeon: Isabel Caprice, MD;  Location: [facility];  Service: Gynecology;  Laterality: Right;   TOTAL ABDOMINAL HYSTERECTOMY     fibroids    UPPER GASTROINTESTINAL ENDOSCOPY     WISDOM TOOTH EXTRACTION     Allergies  Allergen Reactions   Abilify [Aripiprazole] Other (See Comments)    Tardive dyskinesia Oral   Remeron [Mirtazapine] Other (See Comments)    Wgt stimulation /gain, Dizziness, Patient says "can tolerate"   Trazodone And Nefazodone Other (See Comments)    Nightmares/sleep diturbance   Flexeril [Cyclobenzaprine] Other (See Comments)    Pt states Flexeril makes her feel depressed    Amoxicillin Diarrhea and Other (See Comments)    NOTE the patient has had PCN WITHOUT reaction Has patient had a PCN reaction causing immediate rash, facial/tongue/throat swelling, SOB or lightheadedness with hypotension: No Has patient had a PCN reaction causing severe rash involving mucus membranes or skin necrosis: No Has patient had a PCN reaction that required hospitalization: No Has patient had a PCN reaction occurring within the last 10 years: No If all of the above answers are "NO", then may proceed with Cephalosporin use.    Prior to Admission medications   Medication Sig Start Date End Date Taking? Authorizing Provider  amLODipine (NORVASC) 10 MG tablet TAKE 1 TABLET BY MOUTH DAILY Patient taking differently: Take 10 mg by mouth in the morning. 09/26/20  Yes Wendie Agreste, MD  Cholecalciferol (VITAMIN D3) 50 MCG (2000 UT) capsule Take 1 capsule (2,000 Units total) by mouth daily. Patient taking differently: Take 2,000 Units by mouth in the morning. 09/26/20  Yes  Wendie Agreste, MD  diphenhydramine-acetaminophen (TYLENOL PM) 25-500 MG TABS tablet Take 1 tablet by mouth at bedtime.   Yes [provider]  docusate sodium (  COLACE) 100 MG capsule Take 1 capsule (100 mg total) by mouth 2 (two) times daily. 10/23/20  Yes Viona Gilmore D, NP  FEROSUL 325 (65 Fe) MG tablet TAKE 1 TABLET BY MOUTH DAILY WITH BREAKFAST Patient taking differently: Take 325 mg by mouth daily with breakfast. 09/25/20  Yes Wendie Agreste, MD  FLUoxetine (PROZAC) 40 MG capsule Take 40 mg by mouth in the morning. 11/09/18  Yes [provider]  gabapentin (NEURONTIN) 400 MG capsule Take 2 capsules (800 mg total) by mouth at bedtime. 10/23/20  Yes Viona Gilmore D, NP  hydrALAZINE (APRESOLINE) 100 MG tablet Take 100 mg by mouth 3 (three) times daily. 09/26/20  Yes [provider]  INGREZZA 80 MG CAPS Take 80 mg by mouth at bedtime. 10/15/17  Yes [provider]  isosorbide mononitrate (IMDUR) 60 MG 24 hr tablet TAKE 1 TABLET BY MOUTH DAILY Patient taking differently: Take 60 mg by mouth in the morning. 09/25/20  Yes Wendie Agreste, MD  labetalol (NORMODYNE) 300 MG tablet Take 300 mg by mouth 2 (two) times daily.   Yes [provider]  lidocaine (LIDODERM) 5 % Place 1 patch onto the skin daily. Remove & Discard patch within 12 hours or as directed by MD 10/10/20  Yes Nuala Alpha A, PA-C  melatonin 5 MG TABS Take 5 mg by mouth at bedtime.   Yes [provider]  methocarbamol (ROBAXIN) 500 MG tablet Take 1-2 tablets (500-1,000 mg total) by mouth every 6 (six) hours as needed for muscle spasms. 10/23/20  Yes Bergman, Meghan D, NP  ondansetron (ZOFRAN) 4 MG tablet TAKE 1 TABLET BY MOUTH EVERY 8 HOURS AS NEEDED FOR NAUSEA OR VOMITING Patient taking differently: Take 4 mg by mouth every 8 (eight) hours as needed for nausea or vomiting. 01/12/20  Yes Wendie Agreste, MD  oxyCODONE 10 MG TABS Take 1 tablet (10 mg total) by mouth every 4  (four) hours as needed for severe pain ((score 7 to 10)). 10/23/20  Yes Viona Gilmore D, NP  pantoprazole (PROTONIX) 20 MG tablet Take 1 tablet (20 mg total) by mouth 2 (two) times daily. 06/01/20  Yes Aline August, MD  sodium bicarbonate 650 MG tablet Take 650 mg by mouth 2 (two) times daily. 09/04/20  Yes [provider]   Social History   Socioeconomic History   Marital status: Single    Spouse name: Not on file   Number of children: 0   Years of education: 12   Highest education level: High school graduate  Occupational History   Occupation: Disabled  Tobacco Use   Smoking status: Every Day    Packs/day: 1.00    Pack years: 0.00    Types: Cigarettes   Smokeless tobacco: Never  Vaping Use   Vaping Use: Former   Start date: 06/27/2013   Quit date: 06/14/2017   Devices: Apple Cinnamon-0 mg  Substance and Sexual Activity   Alcohol use: Not Currently    Comment: hx over 30 years   Drug use: Not Currently    Comment: h/o IVD use   Sexual activity: Not Currently  Other Topics Concern   Not on file  Social History Narrative   Lives at home alone.   Right-handed.   Caffeine use: 4 cups coffee/soda   Social Determinants of Health   Financial Resource Strain: Not on file  Food Insecurity: Not on file  Transportation Needs: Not on file  Physical Activity: Not on file  Stress: Not on  file  Social Connections: Not on file  Intimate Partner Violence: Not on file    Review of Systems Per HPI.   Objective:   Vitals:   12/20/20 1123  Temp: 98.2 F (36.8 C)  TempSrc: Temporal  SpO2: 96%  Weight: 213 lb (96.6 kg)  Height: 5\' 2"  (1.575 m)     Physical Exam Vitals reviewed.  Constitutional:      General: She is not in acute distress.    Appearance: She is well-developed. She is obese. She is not ill-appearing or diaphoretic.  HENT:     Head: Normocephalic and atraumatic.  Eyes:     Conjunctiva/sclera: Conjunctivae normal.     Pupils: Pupils are equal,  round, and reactive to light.  Neck:     Vascular: No carotid bruit.  Cardiovascular:     Rate and Rhythm: Normal rate and regular rhythm.     Heart sounds: Normal heart sounds.  Pulmonary:     Effort: Pulmonary effort is normal.     Breath sounds: Normal breath sounds.     Comments: No appreciable rales/wheeze with normal effort, speaking in complete sentences.  Abdominal:     Palpations: Abdomen is soft. There is no pulsatile mass.     Tenderness: There is abdominal tenderness (epigastic.).  Musculoskeletal:     Right lower leg: No edema.     Left lower leg: No edema.  Skin:    General: Skin is warm and dry.  Neurological:     Mental Status: She is alert and oriented to person, place, and time.  Psychiatric:        Mood and Affect: Mood normal.        Behavior: Behavior normal.    65 minutes spent during visit, including chart review, counseling and assimilation of information, exam, discussion of plan, and chart completion.   Assessment & Plan:  Sherry [name] is a 65 y.o. female . Chronic abdominal pain - Plan: hyoscyamine (LEVSIN) 0.125 MG tablet  -Personally has suffered from chronic abdominal pain, evaluated by multiple gastroenterologist previously.  Reports similar pains in the past.  Reviewed previous note with gastroenterology, instructing follow-up if not improving based on last treatment in October.  -Trial of Levsin as recommended by gastroenterology previously, discussed potential side effects and to stop medication if new side effects or not improving symptoms  -Advised to call previous gastroenterologist to decide on further treatment or testing, but anticipate she may need to be referred back to pain management as previously recommended by gastroenterology.  -ER precautions given if any acute or worsening pain, or accompanied by new symptoms such as nausea/vomiting/fever or bowel changes  Dizziness - Plan: Ambulatory referral to Cardiology Chronic diastolic CHF  (congestive heart failure) ([place]) Acute pulmonary edema ([place]) - Plan: Ambulatory referral to Cardiology  -Overall appears euvolemic on exam today.  Denies return of chest pain or dyspnea.  Follow-up with cardiology, referral placed.  ER precautions if new dyspnea/chest pain.  -Discussed possible multifactorial dizziness, longstanding.  May be component of her chronic anemia, side effects from her chronic medications, and complicated by use of narcotic pain medication.  Minimal change in CBC from recent hospitalization, but will check repeat CBC in 5 days along with her potassium as borderline elevated on labs from yesterday.  ER precautions if any new or worsening symptoms.  Chronic low back pain, unspecified back pain laterality, unspecified whether sciatica present Impaired functional mobility, balance, gait, and endurance  -Minimal improvement in symptoms from oxycodone, recommended she  discuss treatment options with her surgeon.  Discussed concern of use of oxycodone with history of opioid use disorder. Continues NA meetings and ongoing sponsor.   -likely will need referral to pain mgt - as above for abdominal pain as well. I did refill gabapentin temporarily until follow up.   - continue use of walker for assistance with ambulation and home health (signed orders yesterday after chart review)  Anemia due to chronic kidney disease, unspecified CKD stage - Plan: Basic metabolic panel, CBC Hyperkalemia - Plan: Basic metabolic panel  - borderline potassium with elevated readings in past. lab only visit in 5 days. ER precautions.   Essential hypertension  - elevated in office. Labile, difficult control in past. Home readings reportedly stable. No med changes for now. Cardiology follow up as above, continue nephrology follow up.   Meds ordered this encounter  Medications   hyoscyamine (LEVSIN) 0.125 MG tablet    Sig: Take 1 tablet (0.125 mg total) by mouth every 6 (six) hours as needed for cramping  (abdominal pain).    Dispense:  30 tablet    Refill:  0   gabapentin (NEURONTIN) 400 MG capsule    Sig: Take 1 capsule (400 mg total) by mouth 2 (two) times daily.    Dispense:  60 capsule    Refill:  0   Patient Instructions  Please call  your back surgeon to discuss back pain and the current meds not being effective to discuss options on treatment.   In October of 2021, Dr. Dorrene German (gastroenterologist) recommended follow up if not improving - please call his office [phone] to discuss next step for your abdominal pain.  Can try hyoscamine as recommended by gastroenterology in the past - up to every 6 hours. Stop if new side effects or not helping. If no relief, may need to meet with pain management as had been discussed in the past. Stay on protonix twice per day for now.   Dizziness may be from a number of causes - your anemia, pain medication, and other meds. Make sure you are drinking sufficient fluids. See info on fall prevention in the home.   Recheck for lab visit in 5 days for potassium and anemia. If any new or worsening fatigue or dizziness - be seen sooner or in ER.   Return to the clinic or go to the nearest emergency room if any of your symptoms worsen or new symptoms occur.   Fall Prevention in the Home, Adult Falls can cause injuries and can affect people from all age groups. There are many simple things that you can do to make your home safe and to help preventfalls. Ask for help when making these changes, if needed. What actions can I take to prevent falls? General instructions Use good lighting in all rooms. Replace any light bulbs that burn out. Turn on lights if it is dark. Use night-lights. Place frequently used items in easy-to-reach places. Lower the shelves around your home if necessary. Set up furniture so that there are clear paths around it. Avoid moving your furniture around. Remove throw rugs and other tripping hazards from the floor. Avoid walking on wet  floors. Fix any uneven floor surfaces. Add color or contrast paint or tape to grab bars and handrails in your home. Place contrasting color strips on the first and last steps of stairways. When you use a stepladder, make sure that it is completely opened and that the sides are firmly locked. Have someone hold the ladder  while you are using it. Do not climb a closed stepladder. Be aware of any and all pets. What can I do in the bathroom?     Keep the floor dry. Immediately clean up any water that spills onto the floor. Remove soap buildup in the tub or shower on a regular basis. Use non-skid mats or decals on the floor of the tub or shower. Attach bath mats securely with double-sided, non-slip rug tape. If you need to sit down while you are in the shower, use a plastic, non-slip stool. Install grab bars by the toilet and in the tub and shower. Do not use towel bars as grab bars. What can I do in the bedroom? Make sure that a bedside light is easy to reach. Do not use oversized bedding that drapes onto the floor. Have a firm chair that has side arms to use for getting dressed. What can I do in the kitchen? Clean up any spills right away. If you need to reach for something above you, use a sturdy step stool that has a grab bar. Keep electrical cables out of the way. Do not use floor polish or wax that makes floors slippery. If you must use wax, make sure that it is non-skid floor wax. What can I do in the stairways? Do not leave any items on the stairs. Make sure that you have a light switch at the top of the stairs and the bottom of the stairs. Have them installed if you do not have them. Make sure that there are handrails on both sides of the stairs. Fix handrails that are broken or loose. Make sure that handrails are as long as the stairways. Install non-slip stair treads on all stairs in your home. Avoid having throw rugs at the top or bottom of stairways, or secure the rugs with carpet  tape to prevent them from moving. Choose a carpet design that does not hide the edge of steps on the stairway. Check any carpeting to make sure that it is firmly attached to the stairs. Fix any carpet that is loose or worn. What can I do on the outside of my home? Use bright outdoor lighting. Regularly repair the edges of walkways and driveways and fix any cracks. Remove high doorway thresholds. Trim any shrubbery on the main path into your home. Regularly check that handrails are securely fastened and in good repair. Both sides of any steps should have handrails. Install guardrails along the edges of any raised decks or porches. Clear walkways of debris and clutter, including tools and rocks. Have leaves, snow, and ice cleared regularly. Use sand or salt on walkways during winter months. In the garage, clean up any spills right away, including grease or oil spills. What other actions can I take? Wear closed-toe shoes that fit well and support your feet. Wear shoes that have rubber soles or low heels. Use mobility aids as needed, such as canes, walkers, scooters, and crutches. Review your medicines with your health care provider. Some medicines can cause dizziness or changes in blood pressure, which increase your risk of falling. Talk with your health care provider about other ways that you can decrease your risk of falls. This may include working with a physical therapist or trainer toimprove your strength, balance, and endurance. Where to find more information Centers for Disease Control and Prevention, [employer]: WebmailGuide.co.za [employer] on Aging: BrainJudge.co.uk Contact a health care provider if: You are afraid of falling at home. You feel weak, drowsy,  or dizzy at home. You fall at home. Summary There are many simple things that you can do to make your home safe and to help prevent falls. Ways to make your home safe include removing tripping hazards and  installing grab bars in the bathroom. Ask for help when making these changes in your home. This information is not intended to replace advice given to you by your health care provider. Make sure you discuss any questions you have with your healthcare provider. Document Revised: 06/11/2017 Document Reviewed: 02/11/2017 Elsevier Patient Education  2021 [place],   Merri Ray, MD [place] 12/20/20 1:31 PM

## 2020-12-21 DIAGNOSIS — G8929 Other chronic pain: Secondary | ICD-10-CM | POA: Diagnosis not present

## 2020-12-21 DIAGNOSIS — M545 Low back pain, unspecified: Secondary | ICD-10-CM | POA: Diagnosis not present

## 2020-12-23 ENCOUNTER — Telehealth: Payer: Self-pay | Admitting: Family Medicine

## 2020-12-23 NOTE — Telephone Encounter (Signed)
..  Home Health Certification or Plan of Care Tracking  Is this a Certification or Plan of Care?  Yes  [place] Agency:  [place]  Order Number:  [id number]  Has charge sheet been attached? Yes  Where has form been placed:   In Dr. Vonna Kotyk bin up front

## 2020-12-26 DIAGNOSIS — K219 Gastro-esophageal reflux disease without esophagitis: Secondary | ICD-10-CM | POA: Diagnosis not present

## 2020-12-26 DIAGNOSIS — G8929 Other chronic pain: Secondary | ICD-10-CM | POA: Diagnosis not present

## 2020-12-26 DIAGNOSIS — Z981 Arthrodesis status: Secondary | ICD-10-CM | POA: Diagnosis not present

## 2020-12-26 DIAGNOSIS — I5030 Unspecified diastolic (congestive) heart failure: Secondary | ICD-10-CM | POA: Diagnosis not present

## 2020-12-26 DIAGNOSIS — Z4789 Encounter for other orthopedic aftercare: Secondary | ICD-10-CM | POA: Diagnosis not present

## 2020-12-26 DIAGNOSIS — N184 Chronic kidney disease, stage 4 (severe): Secondary | ICD-10-CM | POA: Diagnosis not present

## 2020-12-26 DIAGNOSIS — M4316 Spondylolisthesis, lumbar region: Secondary | ICD-10-CM | POA: Diagnosis not present

## 2020-12-26 DIAGNOSIS — I130 Hypertensive heart and chronic kidney disease with heart failure and stage 1 through stage 4 chronic kidney disease, or unspecified chronic kidney disease: Secondary | ICD-10-CM | POA: Diagnosis not present

## 2020-12-26 DIAGNOSIS — G4700 Insomnia, unspecified: Secondary | ICD-10-CM | POA: Diagnosis not present

## 2021-01-01 ENCOUNTER — Telehealth: Payer: Self-pay | Admitting: Family Medicine

## 2021-01-01 ENCOUNTER — Other Ambulatory Visit: Payer: Self-pay | Admitting: Family Medicine

## 2021-01-01 DIAGNOSIS — I5030 Unspecified diastolic (congestive) heart failure: Secondary | ICD-10-CM | POA: Diagnosis not present

## 2021-01-01 DIAGNOSIS — Z981 Arthrodesis status: Secondary | ICD-10-CM | POA: Diagnosis not present

## 2021-01-01 DIAGNOSIS — M4316 Spondylolisthesis, lumbar region: Secondary | ICD-10-CM | POA: Diagnosis not present

## 2021-01-01 DIAGNOSIS — K2970 Gastritis, unspecified, without bleeding: Secondary | ICD-10-CM

## 2021-01-01 DIAGNOSIS — R1013 Epigastric pain: Secondary | ICD-10-CM

## 2021-01-01 DIAGNOSIS — I130 Hypertensive heart and chronic kidney disease with heart failure and stage 1 through stage 4 chronic kidney disease, or unspecified chronic kidney disease: Secondary | ICD-10-CM | POA: Diagnosis not present

## 2021-01-01 DIAGNOSIS — N184 Chronic kidney disease, stage 4 (severe): Secondary | ICD-10-CM | POA: Diagnosis not present

## 2021-01-01 DIAGNOSIS — G8929 Other chronic pain: Secondary | ICD-10-CM | POA: Diagnosis not present

## 2021-01-01 DIAGNOSIS — G4700 Insomnia, unspecified: Secondary | ICD-10-CM | POA: Diagnosis not present

## 2021-01-01 DIAGNOSIS — Z4789 Encounter for other orthopedic aftercare: Secondary | ICD-10-CM | POA: Diagnosis not present

## 2021-01-01 DIAGNOSIS — K219 Gastro-esophageal reflux disease without esophagitis: Secondary | ICD-10-CM | POA: Diagnosis not present

## 2021-01-01 NOTE — Telephone Encounter (Signed)
LFD 12/20/20 #30 with no refills LOV 12/20/20 NOV 01/23/21

## 2021-01-01 NOTE — Telephone Encounter (Signed)
Pt called in asking for a refill on the Pantoprazole 20mg .   She also states that the medication for stomach cramps (didn't know the name of it) isn't covered by her insurance. She wanted to know if something else could be sent in for her to the [facility] on [address].  Please advise

## 2021-01-02 ENCOUNTER — Other Ambulatory Visit: Payer: Self-pay | Admitting: Family Medicine

## 2021-01-02 DIAGNOSIS — K2970 Gastritis, unspecified, without bleeding: Secondary | ICD-10-CM

## 2021-01-02 DIAGNOSIS — R1013 Epigastric pain: Secondary | ICD-10-CM

## 2021-01-02 MED ORDER — PANTOPRAZOLE SODIUM 20 MG PO TBEC: 20.0000 mg | DELAYED_RELEASE_TABLET | Freq: Two times a day (BID) | ORAL | 0 refills | Status: DC

## 2021-01-02 NOTE — Telephone Encounter (Signed)
Protonix refilled.  Make sure she is following up with gastroenterology.  Will insurance cover levbid?

## 2021-01-03 DIAGNOSIS — M4316 Spondylolisthesis, lumbar region: Secondary | ICD-10-CM | POA: Diagnosis not present

## 2021-01-03 DIAGNOSIS — G4700 Insomnia, unspecified: Secondary | ICD-10-CM | POA: Diagnosis not present

## 2021-01-03 DIAGNOSIS — Z4789 Encounter for other orthopedic aftercare: Secondary | ICD-10-CM | POA: Diagnosis not present

## 2021-01-03 DIAGNOSIS — I5030 Unspecified diastolic (congestive) heart failure: Secondary | ICD-10-CM | POA: Diagnosis not present

## 2021-01-03 DIAGNOSIS — K219 Gastro-esophageal reflux disease without esophagitis: Secondary | ICD-10-CM | POA: Diagnosis not present

## 2021-01-03 DIAGNOSIS — N184 Chronic kidney disease, stage 4 (severe): Secondary | ICD-10-CM | POA: Diagnosis not present

## 2021-01-03 DIAGNOSIS — G8929 Other chronic pain: Secondary | ICD-10-CM | POA: Diagnosis not present

## 2021-01-03 DIAGNOSIS — I130 Hypertensive heart and chronic kidney disease with heart failure and stage 1 through stage 4 chronic kidney disease, or unspecified chronic kidney disease: Secondary | ICD-10-CM | POA: Diagnosis not present

## 2021-01-03 DIAGNOSIS — Z981 Arthrodesis status: Secondary | ICD-10-CM | POA: Diagnosis not present

## 2021-01-05 DIAGNOSIS — Z4789 Encounter for other orthopedic aftercare: Secondary | ICD-10-CM | POA: Diagnosis not present

## 2021-01-05 DIAGNOSIS — K219 Gastro-esophageal reflux disease without esophagitis: Secondary | ICD-10-CM | POA: Diagnosis not present

## 2021-01-05 DIAGNOSIS — M4316 Spondylolisthesis, lumbar region: Secondary | ICD-10-CM | POA: Diagnosis not present

## 2021-01-05 DIAGNOSIS — D631 Anemia in chronic kidney disease: Secondary | ICD-10-CM | POA: Diagnosis not present

## 2021-01-05 DIAGNOSIS — I5030 Unspecified diastolic (congestive) heart failure: Secondary | ICD-10-CM | POA: Diagnosis not present

## 2021-01-05 DIAGNOSIS — Z981 Arthrodesis status: Secondary | ICD-10-CM | POA: Diagnosis not present

## 2021-01-05 DIAGNOSIS — N184 Chronic kidney disease, stage 4 (severe): Secondary | ICD-10-CM | POA: Diagnosis not present

## 2021-01-05 DIAGNOSIS — G8929 Other chronic pain: Secondary | ICD-10-CM | POA: Diagnosis not present

## 2021-01-05 DIAGNOSIS — I130 Hypertensive heart and chronic kidney disease with heart failure and stage 1 through stage 4 chronic kidney disease, or unspecified chronic kidney disease: Secondary | ICD-10-CM | POA: Diagnosis not present

## 2021-01-07 NOTE — Telephone Encounter (Signed)
Called pharmacy about Levsin, Pt needs to present Insurance card before coverage can be determined.

## 2021-01-07 NOTE — Telephone Encounter (Signed)
Called unable to LM to ask about GI follow up

## 2021-01-08 DIAGNOSIS — Z0001 Encounter for general adult medical examination with abnormal findings: Secondary | ICD-10-CM | POA: Diagnosis not present

## 2021-01-08 DIAGNOSIS — F329 Major depressive disorder, single episode, unspecified: Secondary | ICD-10-CM | POA: Diagnosis not present

## 2021-01-08 DIAGNOSIS — D509 Iron deficiency anemia, unspecified: Secondary | ICD-10-CM | POA: Diagnosis not present

## 2021-01-08 DIAGNOSIS — E559 Vitamin D deficiency, unspecified: Secondary | ICD-10-CM | POA: Diagnosis not present

## 2021-01-08 DIAGNOSIS — M549 Dorsalgia, unspecified: Secondary | ICD-10-CM | POA: Diagnosis not present

## 2021-01-08 DIAGNOSIS — Z008 Encounter for other general examination: Secondary | ICD-10-CM | POA: Diagnosis not present

## 2021-01-08 DIAGNOSIS — F17210 Nicotine dependence, cigarettes, uncomplicated: Secondary | ICD-10-CM | POA: Diagnosis not present

## 2021-01-08 DIAGNOSIS — G2401 Drug induced subacute dyskinesia: Secondary | ICD-10-CM | POA: Diagnosis not present

## 2021-01-09 ENCOUNTER — Other Ambulatory Visit: Payer: Self-pay

## 2021-01-09 ENCOUNTER — Telehealth: Payer: Self-pay | Admitting: Family Medicine

## 2021-01-09 DIAGNOSIS — Z7409 Other reduced mobility: Secondary | ICD-10-CM | POA: Insufficient documentation

## 2021-01-09 DIAGNOSIS — K219 Gastro-esophageal reflux disease without esophagitis: Secondary | ICD-10-CM | POA: Diagnosis not present

## 2021-01-09 DIAGNOSIS — N184 Chronic kidney disease, stage 4 (severe): Secondary | ICD-10-CM | POA: Diagnosis not present

## 2021-01-09 DIAGNOSIS — D631 Anemia in chronic kidney disease: Secondary | ICD-10-CM | POA: Diagnosis not present

## 2021-01-09 DIAGNOSIS — M4316 Spondylolisthesis, lumbar region: Secondary | ICD-10-CM | POA: Diagnosis not present

## 2021-01-09 DIAGNOSIS — Z981 Arthrodesis status: Secondary | ICD-10-CM | POA: Diagnosis not present

## 2021-01-09 DIAGNOSIS — G8929 Other chronic pain: Secondary | ICD-10-CM | POA: Diagnosis not present

## 2021-01-09 DIAGNOSIS — I5030 Unspecified diastolic (congestive) heart failure: Secondary | ICD-10-CM | POA: Diagnosis not present

## 2021-01-09 DIAGNOSIS — Z4789 Encounter for other orthopedic aftercare: Secondary | ICD-10-CM | POA: Diagnosis not present

## 2021-01-09 DIAGNOSIS — I130 Hypertensive heart and chronic kidney disease with heart failure and stage 1 through stage 4 chronic kidney disease, or unspecified chronic kidney disease: Secondary | ICD-10-CM | POA: Diagnosis not present

## 2021-01-09 HISTORY — DX: Other reduced mobility: Z74.09

## 2021-01-09 NOTE — Telephone Encounter (Signed)
..  Home Health Verbal Orders  Agency:  [facility]  Caller:  Ferne Coe and title Physical Therapist  Requesting OT/ PT/ Skilled nursing/ Social Work/ Speech:  - NA  Reason for Request:  Patient missed her visit for her reassessment- Anderson Malta at [facility] will reschedule it for some time next week  Frequency:     HH needs F2F w/in last 30 days

## 2021-01-10 ENCOUNTER — Ambulatory Visit: Payer: Medicare HMO | Admitting: Cardiology

## 2021-01-17 ENCOUNTER — Other Ambulatory Visit: Payer: Self-pay | Admitting: Family Medicine

## 2021-01-17 DIAGNOSIS — N184 Chronic kidney disease, stage 4 (severe): Secondary | ICD-10-CM | POA: Diagnosis not present

## 2021-01-17 DIAGNOSIS — D631 Anemia in chronic kidney disease: Secondary | ICD-10-CM | POA: Diagnosis not present

## 2021-01-17 DIAGNOSIS — Z4789 Encounter for other orthopedic aftercare: Secondary | ICD-10-CM | POA: Diagnosis not present

## 2021-01-17 DIAGNOSIS — I130 Hypertensive heart and chronic kidney disease with heart failure and stage 1 through stage 4 chronic kidney disease, or unspecified chronic kidney disease: Secondary | ICD-10-CM | POA: Diagnosis not present

## 2021-01-17 DIAGNOSIS — G8929 Other chronic pain: Secondary | ICD-10-CM | POA: Diagnosis not present

## 2021-01-17 DIAGNOSIS — Z981 Arthrodesis status: Secondary | ICD-10-CM | POA: Diagnosis not present

## 2021-01-17 DIAGNOSIS — K219 Gastro-esophageal reflux disease without esophagitis: Secondary | ICD-10-CM | POA: Diagnosis not present

## 2021-01-17 DIAGNOSIS — M4316 Spondylolisthesis, lumbar region: Secondary | ICD-10-CM | POA: Diagnosis not present

## 2021-01-17 DIAGNOSIS — I5030 Unspecified diastolic (congestive) heart failure: Secondary | ICD-10-CM | POA: Diagnosis not present

## 2021-01-17 NOTE — Telephone Encounter (Signed)
Medication ordered 01/09/21 by a historical provider.

## 2021-01-18 ENCOUNTER — Other Ambulatory Visit: Payer: Self-pay | Admitting: Family Medicine

## 2021-01-18 NOTE — Telephone Encounter (Signed)
Appointment pending in 5 days.  Hyoscamine temporarily refilled for now

## 2021-01-23 ENCOUNTER — Ambulatory Visit: Payer: Medicare HMO | Admitting: Family Medicine

## 2021-01-31 DIAGNOSIS — N184 Chronic kidney disease, stage 4 (severe): Secondary | ICD-10-CM | POA: Diagnosis not present

## 2021-01-31 DIAGNOSIS — D631 Anemia in chronic kidney disease: Secondary | ICD-10-CM | POA: Diagnosis not present

## 2021-01-31 DIAGNOSIS — K219 Gastro-esophageal reflux disease without esophagitis: Secondary | ICD-10-CM | POA: Diagnosis not present

## 2021-01-31 DIAGNOSIS — Z981 Arthrodesis status: Secondary | ICD-10-CM | POA: Diagnosis not present

## 2021-01-31 DIAGNOSIS — Z4789 Encounter for other orthopedic aftercare: Secondary | ICD-10-CM | POA: Diagnosis not present

## 2021-01-31 DIAGNOSIS — I130 Hypertensive heart and chronic kidney disease with heart failure and stage 1 through stage 4 chronic kidney disease, or unspecified chronic kidney disease: Secondary | ICD-10-CM | POA: Diagnosis not present

## 2021-01-31 DIAGNOSIS — I5030 Unspecified diastolic (congestive) heart failure: Secondary | ICD-10-CM | POA: Diagnosis not present

## 2021-01-31 DIAGNOSIS — M4316 Spondylolisthesis, lumbar region: Secondary | ICD-10-CM | POA: Diagnosis not present

## 2021-01-31 DIAGNOSIS — G8929 Other chronic pain: Secondary | ICD-10-CM | POA: Diagnosis not present

## 2021-02-03 ENCOUNTER — Other Ambulatory Visit: Payer: Self-pay

## 2021-02-04 DIAGNOSIS — N184 Chronic kidney disease, stage 4 (severe): Secondary | ICD-10-CM | POA: Diagnosis not present

## 2021-02-04 DIAGNOSIS — D631 Anemia in chronic kidney disease: Secondary | ICD-10-CM | POA: Diagnosis not present

## 2021-02-04 DIAGNOSIS — I5030 Unspecified diastolic (congestive) heart failure: Secondary | ICD-10-CM | POA: Diagnosis not present

## 2021-02-04 DIAGNOSIS — G8929 Other chronic pain: Secondary | ICD-10-CM | POA: Diagnosis not present

## 2021-02-04 DIAGNOSIS — I130 Hypertensive heart and chronic kidney disease with heart failure and stage 1 through stage 4 chronic kidney disease, or unspecified chronic kidney disease: Secondary | ICD-10-CM | POA: Diagnosis not present

## 2021-02-04 DIAGNOSIS — Z4789 Encounter for other orthopedic aftercare: Secondary | ICD-10-CM | POA: Diagnosis not present

## 2021-02-04 DIAGNOSIS — K219 Gastro-esophageal reflux disease without esophagitis: Secondary | ICD-10-CM | POA: Diagnosis not present

## 2021-02-04 DIAGNOSIS — M4316 Spondylolisthesis, lumbar region: Secondary | ICD-10-CM | POA: Diagnosis not present

## 2021-02-04 DIAGNOSIS — Z981 Arthrodesis status: Secondary | ICD-10-CM | POA: Diagnosis not present

## 2021-02-05 ENCOUNTER — Ambulatory Visit: Payer: Medicare HMO | Admitting: Cardiology

## 2021-02-07 ENCOUNTER — Other Ambulatory Visit: Payer: Self-pay | Admitting: Family Medicine

## 2021-02-07 DIAGNOSIS — K2970 Gastritis, unspecified, without bleeding: Secondary | ICD-10-CM

## 2021-02-07 DIAGNOSIS — R1013 Epigastric pain: Secondary | ICD-10-CM

## 2021-02-12 ENCOUNTER — Telehealth: Payer: Self-pay

## 2021-02-12 DIAGNOSIS — Z9181 History of falling: Secondary | ICD-10-CM

## 2021-02-12 DIAGNOSIS — F419 Anxiety disorder, unspecified: Secondary | ICD-10-CM

## 2021-02-12 DIAGNOSIS — K219 Gastro-esophageal reflux disease without esophagitis: Secondary | ICD-10-CM

## 2021-02-12 DIAGNOSIS — D631 Anemia in chronic kidney disease: Secondary | ICD-10-CM

## 2021-02-12 DIAGNOSIS — F17210 Nicotine dependence, cigarettes, uncomplicated: Secondary | ICD-10-CM

## 2021-02-12 DIAGNOSIS — G8929 Other chronic pain: Secondary | ICD-10-CM

## 2021-02-12 DIAGNOSIS — F319 Bipolar disorder, unspecified: Secondary | ICD-10-CM

## 2021-02-12 DIAGNOSIS — I5030 Unspecified diastolic (congestive) heart failure: Secondary | ICD-10-CM

## 2021-02-12 DIAGNOSIS — M4316 Spondylolisthesis, lumbar region: Secondary | ICD-10-CM

## 2021-02-12 DIAGNOSIS — Z6839 Body mass index (BMI) 39.0-39.9, adult: Secondary | ICD-10-CM

## 2021-02-12 DIAGNOSIS — Z4789 Encounter for other orthopedic aftercare: Secondary | ICD-10-CM

## 2021-02-12 DIAGNOSIS — N184 Chronic kidney disease, stage 4 (severe): Secondary | ICD-10-CM

## 2021-02-12 DIAGNOSIS — Z981 Arthrodesis status: Secondary | ICD-10-CM

## 2021-02-12 DIAGNOSIS — E669 Obesity, unspecified: Secondary | ICD-10-CM

## 2021-02-12 DIAGNOSIS — G2401 Drug induced subacute dyskinesia: Secondary | ICD-10-CM

## 2021-02-12 DIAGNOSIS — I130 Hypertensive heart and chronic kidney disease with heart failure and stage 1 through stage 4 chronic kidney disease, or unspecified chronic kidney disease: Secondary | ICD-10-CM

## 2021-02-12 DIAGNOSIS — G4700 Insomnia, unspecified: Secondary | ICD-10-CM

## 2021-02-12 NOTE — Telephone Encounter (Signed)
  FYI Insurance has denied Home health all together at this present time because additional information was needed from [place] and [place] has tried to appeal at this time.

## 2021-02-17 ENCOUNTER — Other Ambulatory Visit: Payer: Self-pay | Admitting: Family Medicine

## 2021-02-17 ENCOUNTER — Emergency Department (HOSPITAL_BASED_OUTPATIENT_CLINIC_OR_DEPARTMENT_OTHER)
Admission: EM | Admit: 2021-02-17 | Discharge: 2021-02-17 | Disposition: A | Discharge status: Home / Self Care | Payer: Medicare HMO | Attending: Emergency Medicine | Admitting: Emergency Medicine

## 2021-02-17 ENCOUNTER — Other Ambulatory Visit: Payer: Self-pay

## 2021-02-17 ENCOUNTER — Encounter (HOSPITAL_BASED_OUTPATIENT_CLINIC_OR_DEPARTMENT_OTHER): Payer: Self-pay

## 2021-02-17 ENCOUNTER — Other Ambulatory Visit (HOSPITAL_BASED_OUTPATIENT_CLINIC_OR_DEPARTMENT_OTHER): Payer: Self-pay

## 2021-02-17 DIAGNOSIS — F17210 Nicotine dependence, cigarettes, uncomplicated: Secondary | ICD-10-CM | POA: Insufficient documentation

## 2021-02-17 DIAGNOSIS — I130 Hypertensive heart and chronic kidney disease with heart failure and stage 1 through stage 4 chronic kidney disease, or unspecified chronic kidney disease: Secondary | ICD-10-CM | POA: Diagnosis not present

## 2021-02-17 DIAGNOSIS — M545 Low back pain, unspecified: Secondary | ICD-10-CM | POA: Diagnosis present

## 2021-02-17 DIAGNOSIS — G8929 Other chronic pain: Secondary | ICD-10-CM | POA: Insufficient documentation

## 2021-02-17 DIAGNOSIS — I5032 Chronic diastolic (congestive) heart failure: Secondary | ICD-10-CM | POA: Diagnosis not present

## 2021-02-17 DIAGNOSIS — N184 Chronic kidney disease, stage 4 (severe): Secondary | ICD-10-CM | POA: Insufficient documentation

## 2021-02-17 DIAGNOSIS — Z79899 Other long term (current) drug therapy: Secondary | ICD-10-CM | POA: Diagnosis not present

## 2021-02-17 DIAGNOSIS — Z853 Personal history of malignant neoplasm of breast: Secondary | ICD-10-CM | POA: Insufficient documentation

## 2021-02-17 MED ORDER — METHYLPREDNISOLONE 4 MG PO TBPK
ORAL_TABLET | ORAL | 0 refills | Status: DC
  Filled 2021-02-17: qty 21, 6d supply, fill #0

## 2021-02-17 MED ORDER — PREDNISONE 50 MG PO TABS
60.0000 mg | ORAL_TABLET | Freq: Once | ORAL | Status: AC
  Administered 2021-02-17: 60 mg via ORAL
  Filled 2021-02-17: qty 1

## 2021-02-17 MED ORDER — GABAPENTIN 400 MG PO CAPS
400.0000 mg | ORAL_CAPSULE | Freq: Two times a day (BID) | ORAL | 0 refills | Status: DC
  Filled 2021-02-17 (×2): qty 60, 30d supply, fill #0

## 2021-02-17 MED ORDER — GABAPENTIN 300 MG PO CAPS
400.0000 mg | ORAL_CAPSULE | Freq: Once | ORAL | Status: AC
  Administered 2021-02-17: 400 mg via ORAL
  Filled 2021-02-17: qty 1

## 2021-02-17 MED ORDER — METHOCARBAMOL 500 MG PO TABS
500.0000 mg | ORAL_TABLET | Freq: Once | ORAL | Status: AC
  Administered 2021-02-17: 500 mg via ORAL
  Filled 2021-02-17: qty 1

## 2021-02-17 MED ORDER — HYDROCODONE-ACETAMINOPHEN 5-325 MG PO TABS
1.0000 | ORAL_TABLET | Freq: Once | ORAL | Status: AC
Start: 2021-02-17 — End: 2021-02-17
  Administered 2021-02-17: 1 via ORAL
  Filled 2021-02-17: qty 1

## 2021-02-17 MED ORDER — LIDOCAINE 5 % EX PTCH
1.0000 | MEDICATED_PATCH | Freq: Once | CUTANEOUS | Status: DC
  Administered 2021-02-17: 1 via TRANSDERMAL
  Filled 2021-02-17: qty 1

## 2021-02-17 MED ORDER — METHOCARBAMOL 500 MG PO TABS
500.0000 mg | ORAL_TABLET | Freq: Two times a day (BID) | ORAL | 0 refills | Status: DC
  Filled 2021-02-17: qty 20, 10d supply, fill #0

## 2021-02-17 NOTE — ED Notes (Addendum)
Pt states has appointment in 2 days withPCP. Has been taking Gabapentin and tylenol for pain

## 2021-02-17 NOTE — ED Provider Notes (Signed)
[facility] EMERGENCY DEPARTMENT Provider Note   CSN: [id number] Arrival date & time: 02/17/21  1318     History Chief Complaint  Patient presents with   Back Pain    Sherry [name] is a 65 y.o. female.  Sherry [name] is a 65 y.o. female with a complex past medical history including CHF, CKD, hypertension, chronic back pain, polysubstance abuse, who presents to the emergency department for evaluation of worsening low back pain.  Patient reports she had surgery with Dr. Arnoldo Morale in April but reports she has continued to have pain ever since her surgery over the past 4 days pain has been persistent and worsening.  She reports she has now had trouble getting around due to pain.  She denies any associated numbness or weakness in her extremities but does report that the pain intermittently radiates into her right leg.  No loss of bowel or bladder control or saddle anesthesia.  No associated abdominal pain.  No fevers or chills.  She reports she has not called her surgeon due to these worsening symptoms, does have an appointment with her PCP in 2 days.  Reports she has taken some Tylenol and gabapentin with minimal relief and reports she is almost out of gabapentin.  Has not tried anything else for pain and is unable to tolerate NSAIDs due to CKD.  No new trauma or injury to the back.  No other aggravating or alleviating factors.  The history is provided by the patient.      Past Medical History:  Diagnosis Date   Alcohol abuse 05/09/2019   Alcohol use disorder, severe, dependence ([place]) 08/27/2017   Alcohol withdrawal ([place]) 08/09/2017   Anxiety    ARF (acute renal failure) ([place]) 04/09/2020   Arthropathy of lumbar facet joint 05/24/2020   Bilateral low back pain with bilateral sciatica 01/05/2019   Bipolar and related disorder ([place])    Blood transfusion without reported diagnosis    Body mass index (BMI) 40.0-44.9, adult ([place]) 07/19/2020   Breast cancer ([place])    right lumpectemy and lymph  node    Cancer ([place]) 09/06/2017   Cervical spondylosis 05/24/2020   Chest pain 05/31/2020   Chest pain, rule out acute myocardial infarction 05/29/2020   CHF (congestive heart failure) (HCC)    Chronic back pain    Chronic bilateral low back pain with right-sided sciatica 05/24/2020   Chronic diastolic CHF (congestive heart failure) ([place]) 05/09/2019   Chronic kidney disease    "Stage IV" - states due to hypertension   Chronic kidney disease, stage III (moderate) ([place]) 11/17/2016   Chronic low back pain 09/06/2017   Disc levels:      No abnormality at L2-3 or above.      L3-4:  Mild bulging of the disc.  No stenosis.      L4-5: Bilateral facet arthropathy with gaping, fluid-filled joints.   Joint edema. Anterolisthesis would likely occur at this level with   standing and flexion. Disc degeneration with mild bulging of the   disc. Mild narrowing of the lateral recesses and foramina, which   would likely worsen   Cigarette smoker 12/31/2017   CKD (chronic kidney disease) stage 4, GFR 15-29 ml/min (HCC) 04/18/2020   Clonus 05/24/2020   Cyst of ovary    Depression    Depression with anxiety 08/21/2016   Dizziness 02/24/2018   DOE (dyspnea on exertion) 12/30/2017   12/30/2017   Walked RA x one lap @ 185 stopped due to  Sob/ lightheaded  weak with sats 96% at end - Spirometry 12/30/2017  FEV1 1.27 (68%)  Ratio 99 s curvature / abn effort dep portion only     Dyspnea    Dyspnea and respiratory abnormality 02/13/2013   Formatting of this note might be different from the original. STORY: PSG on 05/20/12 showed no OSA but upper airway resistance syndrome, AHI 3.9, severe snoring was noted. ESS=8. UARS. Advise positional therapy, weight loss program, regular exercise.   Essential hypertension 07/06/2017   GERD (gastroesophageal reflux disease)    Grief reaction with prolonged bereavement 09/06/2017   Partner of 18 yrs left 2018-pt admits she still loves her   Hepatitis C    recieved treatment for Hep C    Hepatitis C virus infection cured after antiviral drug therapy 12/17/2012   Telephone Encounter - Dub Mikes, RN - 12/28/2016 9:54 AM EDT Patient called for HCV RNA test results. EOT 08-26-16 - not detected. 12 week post treatment 12-11-16 - not detected. Informed patient that she was cured. Patient verbalized understanding.          History of breast cancer 02/13/2013   Hx of bipolar disorder 01/31/2016   Hypertension    Hypertensive emergency 10/21/2017   Impaired functional mobility, balance, gait, and endurance 01/09/2021   Left-sided weakness    MDD (major depressive disorder), recurrent severe, without psychosis ([place])    Medication intolerance 09/06/2017   Remeron-appetite stimulant/Dizziness/Dysphoria   Microcytic anemia 10/21/2017   Morbid obesity due to excess calories ([place]) 02/13/2013   BMI 42.9   MRSA infection    of breast incision   Nausea 04/09/2020   Formatting of this note might be different from the original. Added automatically from request for surgery [id number]   Near syncope 05/29/2020   Neck pain 05/24/2020   Neuroleptic-induced tardive dyskinesia 09/06/2017   Abilify   Opioid dependence ([place])    Has been treated in [facility] in past   Opioid use disorder, severe, dependence ([place]) 08/27/2017   Oral aphthous ulcer 06/25/2015   Last Assessment & Plan:  Formatting of this note might be different from the original. - Appear to be resolving as a be expected in the case of aphthous stomatitis. - Continue topical therapy with viscous lidocaine.  Recommended trying high-dose anti-inflammatory such as 600 mg of ibuprofen as needed. - She'll return to ENT clinic if symptoms do not resolve.   Phlebitis after infusion 10/28/2017   Rt antecubital fossa   Pre-operative cardiovascular examination 06/13/2018   Restless legs syndrome 02/13/2013   Formatting of this note might be different from the original. IMPRESSION: Possible. Will follow.   Spondylolisthesis, lumbar region 10/21/2020   Subacute  dyskinesia due to drug 02/13/2013   Formatting of this note might be different from the original. STORY: Tardive dyskinesia and mild limb dyskinesia, LE>UE which likely due to prolonged antipsychotic used, abilify. Couldn't tolerate artane, gabapentin, clonazepam and xenazine.`E1o3L`IMPRESSION: Well tolerated depakote 250mg  bid, mood aspect is better as well. Will increase to 500mg  bid. ADR was discussed.  RTC 4-6 weeks.   Substance abuse ([place])    Substance induced mood disorder ([place]) 08/13/2017   TIA (transient ischemic attack) 2020   P/w BP >230/120 and neurologic symptoms, presumed TIA    Patient Active Problem List   Diagnosis Date Noted   Impaired functional mobility, balance, gait, and endurance 01/09/2021   Spondylolisthesis, lumbar region 10/21/2020   Anxiety    Bipolar and related disorder ([place])    Blood transfusion without reported diagnosis  Breast cancer ([place])    CHF (congestive heart failure) (HCC)    Chronic back pain    Chronic kidney disease    Depression    Dyspnea    Hepatitis C    MRSA infection    Opioid dependence ([place])    Substance abuse ([place])    Body mass index (BMI) 40.0-44.9, adult ([place]) 07/19/2020   Chest pain 05/31/2020   Near syncope 05/29/2020   Chest pain, rule out acute myocardial infarction 05/29/2020   Cervical spondylosis 05/24/2020   Clonus 05/24/2020   Neck pain 05/24/2020   Chronic bilateral low back pain with right-sided sciatica 05/24/2020   Arthropathy of lumbar facet joint 05/24/2020   CKD (chronic kidney disease) stage 4, GFR 15-29 ml/min ([place]) 04/18/2020   ARF (acute renal failure) ([place]) 04/09/2020   Nausea 04/09/2020   Chronic diastolic CHF (congestive heart failure) ([place]) 05/09/2019   GERD (gastroesophageal reflux disease) 05/09/2019   Alcohol abuse 05/09/2019   Hypertension 05/08/2019   Bilateral low back pain with bilateral sciatica 01/05/2019   TIA (transient ischemic attack) 2020   Cyst of ovary    Pre-operative cardiovascular  examination 06/13/2018   Dizziness 02/24/2018   Left-sided weakness 02/24/2018   Cigarette smoker 12/31/2017   DOE (dyspnea on exertion) 12/30/2017   Phlebitis after infusion 10/28/2017   Hypertensive emergency 10/21/2017   Microcytic anemia 10/21/2017   Medication intolerance 09/06/2017   Neuroleptic-induced tardive dyskinesia 09/06/2017   Cancer ([place]) 09/06/2017   Chronic low back pain 09/06/2017   Grief reaction with prolonged bereavement 09/06/2017   Opioid use disorder, severe, dependence ([place]) 08/27/2017   Alcohol use disorder, severe, dependence ([place]) 08/27/2017   Substance induced mood disorder ([place]) 08/13/2017   MDD (major depressive disorder), recurrent severe, without psychosis ([place])    Alcohol withdrawal ([place]) 08/09/2017   Essential hypertension 07/06/2017   Chronic kidney disease, stage III (moderate) ([place]) 11/17/2016   Depression with anxiety 08/21/2016   Hx of bipolar disorder 01/31/2016   Oral aphthous ulcer 06/25/2015   History of breast cancer 02/13/2013   Morbid obesity due to excess calories ([place]) 02/13/2013   Restless legs syndrome 02/13/2013   Subacute dyskinesia due to drug 02/13/2013   Dyspnea and respiratory abnormality 02/13/2013   Hepatitis C virus infection cured after antiviral drug therapy 12/17/2012    Past Surgical History:  Procedure Laterality Date   BACK SURGERY  1990   BREAST SURGERY Right 2010   "breast cancer survivor" - states partial mastectomy and nodes   COLONOSCOPY     [facility]   ESOPHAGOGASTRODUODENOSCOPY     [facility]   ESOPHAGOGASTRODUODENOSCOPY  04/18/2020   [facility]   LAPAROSCOPIC CHOLECYSTECTOMY  2002   LAPAROSCOPIC INCISIONAL / UMBILICAL / [place]  04/13/2018   with BARD 15x 85ID mesh (supraumbilical)   LAPAROSCOPIC LYSIS OF ADHESIONS  07/12/2018   Procedure: LAPAROSCOPIC LYSIS OF ADHESIONS;  Surgeon: Isabel Caprice, MD;  Location: [facility];  Service: Gynecology;;   MULTIPLE TOOTH  EXTRACTIONS     MYOMECTOMY     x 2 prior to hysterectomy   ROBOTIC ASSISTED BILATERAL SALPINGO OOPHERECTOMY Right 07/12/2018   Procedure: XI ROBOTIC ASSISTED RIGHT SALPINGO OOPHORECTOMY;  Surgeon: Isabel Caprice, MD;  Location: [facility];  Service: Gynecology;  Laterality: Right;   TOTAL ABDOMINAL HYSTERECTOMY     fibroids    UPPER GASTROINTESTINAL ENDOSCOPY     WISDOM TOOTH EXTRACTION       OB History     Gravida  2   Para  0   Term      Preterm      AB  0   Living         SAB      IAB      Ectopic      Multiple      Live Births              Family History  Problem Relation Age of Onset   Heart attack Mother    Breast cancer Mother 36   Dementia Mother    Cancer Father 65       died of bleeding from kidneys   Colon cancer Neg Hx    Esophageal cancer Neg Hx    Stomach cancer Neg Hx    Rectal cancer Neg Hx     Social History   Tobacco Use   Smoking status: Every Day    Packs/day: 1.00    Types: Cigarettes   Smokeless tobacco: Never  Vaping Use   Vaping Use: Former   Start date: 06/27/2013   Quit date: 06/14/2017   Devices: Apple Cinnamon-0 mg  Substance Use Topics   Alcohol use: Not Currently    Comment: hx over 30 years   Drug use: Not Currently    Comment: h/o IVD use    Home Medications Prior to Admission medications   Medication Sig Start Date End Date Taking? Authorizing Provider  gabapentin (NEURONTIN) 400 MG capsule Take 1 capsule (400 mg total) by mouth 2 (two) times daily. 02/17/21 03/19/21 Yes Jacqlyn Larsen, PA-C  methocarbamol (ROBAXIN) 500 MG tablet Take 1 tablet (500 mg total) by mouth 2 (two) times daily. 02/17/21  Yes Jacqlyn Larsen, PA-C  methylPREDNISolone (MEDROL DOSEPAK) 4 MG TBPK tablet Take as directed by mouth, see back of pack for instructions 02/17/21  Yes Jacqlyn Larsen, PA-C  amLODipine (NORVASC) 10 MG tablet Take 10 mg by mouth daily.    [provider]  Cholecalciferol (VITAMIN D3) 50 MCG (2000 UT) capsule  Take 1 capsule (2,000 Units total) by mouth daily. Patient taking differently: Take 2,000 Units by mouth in the morning. 09/26/20   Wendie Agreste, MD  diphenhydramine-acetaminophen (TYLENOL PM) 25-500 MG TABS tablet Take 1 tablet by mouth at bedtime.    [provider]  docusate sodium (COLACE) 100 MG capsule Take 1 capsule (100 mg total) by mouth 2 (two) times daily. 10/23/20   Viona Gilmore D, NP  ferrous sulfate 325 (65 FE) MG tablet Take 1 tablet by mouth daily with breakfast.    [provider]  FLUoxetine (PROZAC) 40 MG capsule Take 40 mg by mouth in the morning. 11/09/18   [provider]  furosemide (LASIX) 20 MG tablet Take 20 mg by mouth daily. 07/31/20   [provider]  gabapentin (NEURONTIN) 400 MG capsule TAKE 1 CAPSULE(400 MG) BY MOUTH TWICE DAILY 01/20/21   Wendie Agreste, MD  hydrALAZINE (APRESOLINE) 100 MG tablet Take 100 mg by mouth 3 (three) times daily. 09/26/20   [provider]  hyoscyamine (LEVSIN) 0.125 MG tablet TAKE 1 TABLET(0.125 MG) BY MOUTH EVERY 6 HOURS AS NEEDED FOR CRAMPING OR ABDOMINAL PAIN 01/18/21   Wendie Agreste, MD  INGREZZA 80 MG CAPS Take 80 mg by mouth at bedtime. 10/15/17   [provider]  isosorbide mononitrate (IMDUR) 60 MG 24 hr tablet Take 60 mg by mouth daily.    [provider]  labetalol (NORMODYNE) 300 MG  tablet Take 300 mg by mouth 2 (two) times daily.    [provider]  lidocaine (LIDODERM) 5 % Place 1 patch onto the skin daily. Remove & Discard patch within 12 hours or as directed by MD 10/10/20   Nuala Alpha A, PA-C  melatonin 5 MG TABS Take 5 mg by mouth at bedtime.    [provider]  ondansetron (ZOFRAN) 4 MG tablet Take 4 mg by mouth every 8 (eight) hours as needed for nausea/vomiting.    [provider]  pantoprazole (PROTONIX) 20 MG tablet TAKE 1 TABLET(20 MG) BY MOUTH TWICE DAILY 02/07/21   Wendie Agreste, MD  sodium bicarbonate 650 MG  tablet Take 650 mg by mouth 2 (two) times daily. 09/04/20   [provider]    Allergies    Abilify [aripiprazole], Remeron [mirtazapine], Trazodone and nefazodone, Clavulanic acid, Flexeril [cyclobenzaprine], and Amoxicillin  Review of Systems   Review of Systems  Constitutional:  Negative for chills and fever.  HENT: Negative.    Respiratory:  Negative for shortness of breath.   Cardiovascular:  Negative for chest pain.  Gastrointestinal:  Negative for abdominal pain, constipation, diarrhea, nausea and vomiting.  Genitourinary:  Negative for dysuria, flank pain, frequency and hematuria.  Musculoskeletal:  Positive for back pain. Negative for arthralgias, gait problem, joint swelling, myalgias and neck pain.  Skin:  Negative for color change, rash and wound.  Neurological:  Negative for weakness and numbness.  All other systems reviewed and are negative.  Physical Exam Updated Vital Signs BP (!) 210/94 (BP Location: Left Arm)   Pulse 73   Temp 98.9 F (37.2 C) (Oral)   Resp 20   Ht 5\' 2"  (1.575 m)   Wt 94.8 kg   SpO2 98%   BMI 38.23 kg/m   Physical Exam Vitals and nursing note reviewed.  Constitutional:      General: She is not in acute distress.    Appearance: She is well-developed. She is not diaphoretic.  HENT:     Head: Atraumatic.  Eyes:     General:        Right eye: No discharge.        Left eye: No discharge.  Cardiovascular:     Pulses:          Radial pulses are 2+ on the right side and 2+ on the left side.       Dorsalis pedis pulses are 2+ on the right side and 2+ on the left side.       Posterior tibial pulses are 2+ on the right side and 2+ on the left side.  Pulmonary:     Effort: Pulmonary effort is normal. No respiratory distress.  Abdominal:     General: Bowel sounds are normal. There is no distension.     Palpations: Abdomen is soft. There is no mass.     Tenderness: There is no abdominal tenderness. There is no guarding.     Comments:  Abdomen soft, nondistended, nontender to palpation in all quadrants without guarding or peritoneal signs, no CVA tenderness bilaterally  Musculoskeletal:     Cervical back: Neck supple.     Comments: Tenderness to palpation over right low back musculature, no focal midline tenderness, prior surgical scar noted.  Pain made worse with range of motion of the lower extremities, positive straight leg raise on the right.  Skin:    General: Skin is warm and dry.     Capillary Refill: Capillary refill takes  less than 2 seconds.  Neurological:     Mental Status: She is alert and oriented to person, place, and time.     Comments: Alert, clear speech, following commands. Moving all extremities without difficulty. Bilateral lower extremities with 5/5 strength in proximal and distal muscle groups and with dorsi and plantar flexion. Sensation intact in bilateral lower extremities. 2+ patellar DTRs bilaterally. Ambulatory with steady gait  Psychiatric:        Mood and Affect: Mood normal.        Behavior: Behavior normal.    ED Results / Procedures / Treatments   Labs (all labs ordered are listed, but only abnormal results are displayed) Labs Reviewed - No data to display  EKG None  Radiology No results found.  Procedures Procedures   Medications Ordered in ED Medications  lidocaine (LIDODERM) 5 % 1 patch (1 patch Transdermal Patch Applied 02/17/21 1503)  HYDROcodone-acetaminophen (NORCO/VICODIN) 5-325 MG per tablet 1 tablet (1 tablet Oral Given 02/17/21 1504)  methocarbamol (ROBAXIN) tablet 500 mg (500 mg Oral Given 02/17/21 1504)  gabapentin (NEURONTIN) capsule 400 mg (400 mg Oral Given 02/17/21 1504)  predniSONE (DELTASONE) tablet 60 mg (60 mg Oral Given 02/17/21 1504)    ED Course  I have reviewed the triage vital signs and the nursing notes.  Pertinent labs & imaging results that were available during my care of the patient were reviewed by me and considered in my medical decision making  (see chart for details).    MDM Rules/Calculators/A&P                           Patient presents with complaint of back pain.  Patient is nontoxic appearing, vitals are significant for hypertension, otherwise unremarkable. Patient has normal neurologic exam, no point/focal midline tenderness to palpation. Ambulatory in the ED.  No back pain red flags. No urinary sxs.  Acute exacerbation of chronic back pain which has been ongoing since surgery, do not feel that imaging is indicated. Considered UTI/pyelonephritis, kidney stone, aortic aneurysm/dissection, cauda equina or epidural abscess however these do not feel these diagnoses fit clinical picture at this time. Will treat with pain medication, Robaxin, gabapentin and prednisone, discussed with patient that they are not to drive or operate heavy machinery while taking Robaxin. I discussed treatment plan, need for PCP/neurosurgeon follow-up, and return precautions with the patient. Provided opportunity for questions, patient confirmed understanding and is in agreement with plan.   Final Clinical Impression(s) / ED Diagnoses Final diagnoses:  Acute exacerbation of chronic low back pain    Rx / DC Orders ED Discharge Orders          Ordered    gabapentin (NEURONTIN) 400 MG capsule  2 times daily        02/17/21 1549    methylPREDNISolone (MEDROL DOSEPAK) 4 MG TBPK tablet        02/17/21 1549    methocarbamol (ROBAXIN) 500 MG tablet  2 times daily        02/17/21 1549             Jacqlyn Larsen, [place] 02/17/21 Bosie Helper    Drenda Freeze, MD 02/17/21 2259

## 2021-02-17 NOTE — ED Triage Notes (Signed)
"  Back surgery a few months ago and pain is consistent since then" per pt

## 2021-02-17 NOTE — Discharge Instructions (Addendum)
You were seen here today for Back Pain: Low back pain is discomfort in the lower back that may be due to injuries to muscles and ligaments around the spine. Occasionally, it may be caused by a problem to a part of the spine called a disc. Your back pain should be treated with medicines listed below as well as back exercises and this back pain should get better over the next 2 weeks. Most patients get completely well in 4 weeks. It is important to know however, if you develop severe or worsening pain, low back pain with fever, numbness, weakness or inability to walk or urinate, you should return to the ER immediately.  Please follow up with your doctor this week for a recheck if still having symptoms.  HOME INSTRUCTIONS Self - care:  The application of heat can help soothe the pain.  Maintaining your daily activities, including walking (this is encouraged), as it will help you get better faster than just staying in bed. Do not life, push, pull anything more than 10 pounds for the next week. I am attaching back exercises that you can do at home to help facilitate your recovery.   Back Exercises - I have attached a handout on back exercises that can be done at home to help facilitate your recovery.   Medications are also useful to help with pain control.   Acetaminophen.  This medication is generally safe, and found over the counter. Take as directed for your age. You should not take more than 8 of the extra strength (500mg ) pills a day (max dose is 4000mg  total OVER one day)  Lidocaine Patch: Salon Pas lidocaine patches (blue and silver box) can be purchased over the counter and worn for 12 hours for local pain relief   Gabapentin: Continue taking twice daily to help with low back pain.  Muscle relaxants:  These medications can help with muscle tightness that is a cause of lower back pain.  Most of these medications can cause drowsiness, and it is not safe to drive or use dangerous machinery while  taking them. They are primarily helpful when taken at night before sleep.  Prednisone/Medrol - This is an oral steroid.  This medication is best taken with food in the morning.  Please note that this medication can cause anxiety, mood swings, muscle fatigue, increased hunger, weight gain (sodium/fluid retention), poor sleep as well as other symptoms. If you are a diabetic, please monitor your blood sugars at home as this medication can increase your blood sugars. Call your pharmacist if you have any questions.  You will need to follow up with your primary healthcare provider or the Orthopedist in 1-2 weeks for reassessment and persistent symptoms.  Be aware that if you develop new symptoms, such as a fever, leg weakness, difficulty with or loss of control of your urine or bowels, abdominal pain, or more severe pain, you will need to seek medical attention and/or return to the Emergency department. Additional Information:  Your vital signs today were: BP (!) 210/94 (BP Location: Left Arm)   Pulse 73   Temp 98.9 F (37.2 C) (Oral)   Resp 20   Ht 5\' 2"  (1.575 m)   Wt 94.8 kg   SpO2 98%   BMI 38.23 kg/m  If your blood pressure (BP) was elevated above 135/85 this visit, please have this repeated by your doctor within one month. ---------------

## 2021-02-17 NOTE — Telephone Encounter (Signed)
Per your visit note on 12/20/20, "At end of visit - requests refill of gabapentin - now taking 400mg  BID.  I've requested another appointment to review this further, but temporary refill given for now" Patient has had no appointment since that time. Please advise. No up coming appointment.

## 2021-02-18 NOTE — Telephone Encounter (Signed)
Temporary refill granted until she schedules office visit.

## 2021-02-24 ENCOUNTER — Other Ambulatory Visit (HOSPITAL_COMMUNITY): Payer: Self-pay | Admitting: *Deleted

## 2021-02-25 ENCOUNTER — Encounter (HOSPITAL_COMMUNITY): Payer: Self-pay

## 2021-02-25 ENCOUNTER — Inpatient Hospital Stay (HOSPITAL_COMMUNITY)
Admission: RE | Admit: 2021-02-25 | Discharge: 2021-02-25 | Disposition: A | Discharge status: Home / Self Care | Payer: Medicare HMO | Source: Ambulatory Visit | Attending: Nephrology | Admitting: Nephrology

## 2021-03-11 ENCOUNTER — Encounter (HOSPITAL_COMMUNITY): Payer: Medicare HMO

## 2021-03-18 ENCOUNTER — Other Ambulatory Visit: Payer: Self-pay

## 2021-03-18 ENCOUNTER — Encounter (HOSPITAL_COMMUNITY)
Admission: RE | Admit: 2021-03-18 | Discharge: 2021-03-18 | Disposition: A | Discharge status: Home / Self Care | Payer: Medicare HMO | Source: Ambulatory Visit | Attending: Nephrology | Admitting: Nephrology

## 2021-03-18 VITALS — BP 173/59 | HR 73 | Temp 98.4°F | Resp 17

## 2021-03-18 DIAGNOSIS — N184 Chronic kidney disease, stage 4 (severe): Secondary | ICD-10-CM | POA: Diagnosis present

## 2021-03-18 MED ORDER — EPOETIN ALFA-EPBX 10000 UNIT/ML IJ SOLN: 15000.0000 [IU] | INTRAMUSCULAR | Status: DC

## 2021-03-18 MED ORDER — EPOETIN ALFA-EPBX 10000 UNIT/ML IJ SOLN
INTRAMUSCULAR | Status: AC
  Administered 2021-03-18: 15000 [IU] via SUBCUTANEOUS
  Filled 2021-03-18: qty 2

## 2021-03-18 MED ORDER — SODIUM CHLORIDE 0.9 % IV SOLN
510.0000 mg | Freq: Once | INTRAVENOUS | Status: AC
  Administered 2021-03-18: 510 mg via INTRAVENOUS
  Filled 2021-03-18: qty 17

## 2021-03-19 ENCOUNTER — Emergency Department (HOSPITAL_BASED_OUTPATIENT_CLINIC_OR_DEPARTMENT_OTHER)
Admission: EM | Admit: 2021-03-19 | Discharge: 2021-03-19 | Disposition: A | Discharge status: Home / Self Care | Payer: Medicare HMO | Attending: Emergency Medicine | Admitting: Emergency Medicine

## 2021-03-19 ENCOUNTER — Encounter (HOSPITAL_BASED_OUTPATIENT_CLINIC_OR_DEPARTMENT_OTHER): Payer: Self-pay | Admitting: Emergency Medicine

## 2021-03-19 ENCOUNTER — Other Ambulatory Visit: Payer: Self-pay

## 2021-03-19 ENCOUNTER — Encounter (HOSPITAL_COMMUNITY): Payer: Medicare HMO

## 2021-03-19 DIAGNOSIS — N184 Chronic kidney disease, stage 4 (severe): Secondary | ICD-10-CM | POA: Insufficient documentation

## 2021-03-19 DIAGNOSIS — I5032 Chronic diastolic (congestive) heart failure: Secondary | ICD-10-CM | POA: Diagnosis not present

## 2021-03-19 DIAGNOSIS — M5441 Lumbago with sciatica, right side: Secondary | ICD-10-CM | POA: Insufficient documentation

## 2021-03-19 DIAGNOSIS — Z853 Personal history of malignant neoplasm of breast: Secondary | ICD-10-CM | POA: Insufficient documentation

## 2021-03-19 DIAGNOSIS — I130 Hypertensive heart and chronic kidney disease with heart failure and stage 1 through stage 4 chronic kidney disease, or unspecified chronic kidney disease: Secondary | ICD-10-CM | POA: Insufficient documentation

## 2021-03-19 DIAGNOSIS — Z79899 Other long term (current) drug therapy: Secondary | ICD-10-CM | POA: Diagnosis not present

## 2021-03-19 DIAGNOSIS — M5431 Sciatica, right side: Secondary | ICD-10-CM

## 2021-03-19 DIAGNOSIS — F17210 Nicotine dependence, cigarettes, uncomplicated: Secondary | ICD-10-CM | POA: Diagnosis not present

## 2021-03-19 DIAGNOSIS — M545 Low back pain, unspecified: Secondary | ICD-10-CM | POA: Diagnosis present

## 2021-03-19 LAB — POCT HEMOGLOBIN-HEMACUE: Hemoglobin: 9.4 g/dL — ABNORMAL LOW (ref 12.0–15.0)

## 2021-03-19 MED ORDER — OXYCODONE-ACETAMINOPHEN 5-325 MG PO TABS
2.0000 | ORAL_TABLET | Freq: Once | ORAL | Status: AC
  Administered 2021-03-19: 2 via ORAL
  Filled 2021-03-19: qty 2

## 2021-03-19 MED ORDER — GABAPENTIN 400 MG PO CAPS: 400.0000 mg | ORAL_CAPSULE | Freq: Two times a day (BID) | ORAL | 0 refills | Status: DC

## 2021-03-19 MED ORDER — GABAPENTIN 300 MG PO CAPS
400.0000 mg | ORAL_CAPSULE | Freq: Once | ORAL | Status: AC
  Administered 2021-03-19: 400 mg via ORAL
  Filled 2021-03-19: qty 1

## 2021-03-19 NOTE — ED Provider Notes (Signed)
[facility] EMERGENCY DEPARTMENT Provider Note   CSN: [id number] Arrival date & time: 03/19/21  0110     History Chief Complaint  Patient presents with  . Back Pain    Sherry [name] is a 65 y.o. female.  History of sciatica, here with recurrence of same. No new symptoms. No fever. No trauma. No incontinence.    Back Pain Location:  Lumbar spine Quality:  Aching, shooting and stabbing Radiates to:  R posterior upper leg Pain severity:  Mild Onset quality:  Gradual Duration:  2 days Timing:  Intermittent Progression:  Worsening Chronicity:  Recurrent Context: not emotional stress and not falling   Relieved by:  None tried Worsened by:  Nothing Ineffective treatments:  None tried Associated symptoms: no abdominal pain, no abdominal swelling, no fever, no leg pain, no tingling and no weakness       Past Medical History:  Diagnosis Date  . Alcohol abuse 05/09/2019  . Alcohol use disorder, severe, dependence ([place]) 08/27/2017  . Alcohol withdrawal ([place]) 08/09/2017  . Anxiety   . ARF (acute renal failure) ([place]) 04/09/2020  . Arthropathy of lumbar facet joint 05/24/2020  . Bilateral low back pain with bilateral sciatica 01/05/2019  . Bipolar and related disorder ([place])   . Blood transfusion without reported diagnosis   . Body mass index (BMI) 40.0-44.9, adult ([place]) 07/19/2020  . Breast cancer ([place])    right lumpectemy and lymph node   . Cancer ([place]) 09/06/2017  . Cervical spondylosis 05/24/2020  . Chest pain 05/31/2020  . Chest pain, rule out acute myocardial infarction 05/29/2020  . CHF (congestive heart failure) ([place])   . Chronic back pain   . Chronic bilateral low back pain with right-sided sciatica 05/24/2020  . Chronic diastolic CHF (congestive heart failure) ([employer]) 05/09/2019  . Chronic kidney disease    "Stage IV" - states due to hypertension  . Chronic kidney disease, stage III (moderate) ([place]) 11/17/2016  . Chronic low back pain 09/06/2017   Disc levels:       No abnormality at L2-3 or above.      L3-4:  Mild bulging of the disc.  No stenosis.      L4-5: Bilateral facet arthropathy with gaping, fluid-filled joints.   Joint edema. Anterolisthesis would likely occur at this level with   standing and flexion. Disc degeneration with mild bulging of the   disc. Mild narrowing of the lateral recesses and foramina, which   would likely worsen  . Cigarette smoker 12/31/2017  . CKD (chronic kidney disease) stage 4, GFR 15-29 ml/min (HCC) 04/18/2020  . Clonus 05/24/2020  . Cyst of ovary   . Depression   . Depression with anxiety 08/21/2016  . Dizziness 02/24/2018  . DOE (dyspnea on exertion) 12/30/2017   12/30/2017   Walked RA x one lap @ 185 stopped due to  Sob/ lightheaded weak with sats 96% at end - Spirometry 12/30/2017  FEV1 1.27 (68%)  Ratio 99 s curvature / abn effort dep portion only    . Dyspnea   . Dyspnea and respiratory abnormality 02/13/2013   Formatting of this note might be different from the original. STORY: PSG on 05/20/12 showed no OSA but upper airway resistance syndrome, AHI 3.9, severe snoring was noted. ESS=8. UARS. Advise positional therapy, weight loss program, regular exercise.  . Essential hypertension 07/06/2017  . GERD (gastroesophageal reflux disease)   . Grief reaction with prolonged bereavement 09/06/2017   Partner of 18 yrs left 2018-pt admits she still loves  her  . Hepatitis C    recieved treatment for Hep C  . Hepatitis C virus infection cured after antiviral drug therapy 12/17/2012   Telephone Encounter - Dub Mikes, RN - 12/28/2016 9:54 AM EDT Patient called for HCV RNA test results. EOT 08-26-16 - not detected. 12 week post treatment 12-11-16 - not detected. Informed patient that she was cured. Patient verbalized understanding.         Marland Kitchen History of breast cancer 02/13/2013  . Hx of bipolar disorder 01/31/2016  . Hypertension   . Hypertensive emergency 10/21/2017  . Impaired functional mobility, balance, gait, and endurance 01/09/2021  .  Left-sided weakness   . MDD (major depressive disorder), recurrent severe, without psychosis ([place])   . Medication intolerance 09/06/2017   Remeron-appetite stimulant/Dizziness/Dysphoria  . Microcytic anemia 10/21/2017  . Morbid obesity due to excess calories ([place]) 02/13/2013   BMI 42.9  . MRSA infection    of breast incision  . Nausea 04/09/2020   Formatting of this note might be different from the original. Added automatically from request for surgery [id number]  . Near syncope 05/29/2020  . Neck pain 05/24/2020  . Neuroleptic-induced tardive dyskinesia 09/06/2017   Abilify  . Opioid dependence ([place])    Has been treated in [facility] in past  . Opioid use disorder, severe, dependence ([employer]) 08/27/2017  . Oral aphthous ulcer 06/25/2015   Last Assessment & Plan:  Formatting of this note might be different from the original. - Appear to be resolving as a be expected in the case of aphthous stomatitis. - Continue topical therapy with viscous lidocaine.  Recommended trying high-dose anti-inflammatory such as 600 mg of ibuprofen as needed. - She'll return to ENT clinic if symptoms do not resolve.  . Phlebitis after infusion 10/28/2017   Rt antecubital fossa  . Pre-operative cardiovascular examination 06/13/2018  . Restless legs syndrome 02/13/2013   Formatting of this note might be different from the original. IMPRESSION: Possible. Will follow.  Marland Kitchen Spondylolisthesis, lumbar region 10/21/2020  . Subacute dyskinesia due to drug 02/13/2013   Formatting of this note might be different from the original. STORY: Tardive dyskinesia and mild limb dyskinesia, LE>UE which likely due to prolonged antipsychotic used, abilify. Couldn't tolerate artane, gabapentin, clonazepam and xenazine.`E1o3L`IMPRESSION: Well tolerated depakote 250mg  bid, mood aspect is better as well. Will increase to 500mg  bid. ADR was discussed.  RTC 4-6 weeks.  . Substance abuse ([place])   . Substance induced mood disorder ([place]) 08/13/2017  . TIA  (transient ischemic attack) 2020   P/w BP >230/120 and neurologic symptoms, presumed TIA    Patient Active Problem List   Diagnosis Date Noted  . Impaired functional mobility, balance, gait, and endurance 01/09/2021  . Spondylolisthesis, lumbar region 10/21/2020  . Anxiety   . Bipolar and related disorder ([place])   . Blood transfusion without reported diagnosis   . Breast cancer ([place])   . CHF (congestive heart failure) ([place])   . Chronic back pain   . Chronic kidney disease   . Depression   . Dyspnea   . Hepatitis C   . MRSA infection   . Opioid dependence ([place])   . Substance abuse ([place])   . Body mass index (BMI) 40.0-44.9, adult ([place]) 07/19/2020  . Chest pain 05/31/2020  . Near syncope 05/29/2020  . Chest pain, rule out acute myocardial infarction 05/29/2020  . Cervical spondylosis 05/24/2020  . Clonus 05/24/2020  . Neck pain 05/24/2020  . Chronic bilateral low back pain with right-sided sciatica  05/24/2020  . Arthropathy of lumbar facet joint 05/24/2020  . CKD (chronic kidney disease) stage 4, GFR 15-29 ml/min (HCC) 04/18/2020  . ARF (acute renal failure) ([place]) 04/09/2020  . Nausea 04/09/2020  . Chronic diastolic CHF (congestive heart failure) ([place]) 05/09/2019  . GERD (gastroesophageal reflux disease) 05/09/2019  . Alcohol abuse 05/09/2019  . Hypertension 05/08/2019  . Bilateral low back pain with bilateral sciatica 01/05/2019  . TIA (transient ischemic attack) 2020  . Cyst of ovary   . Pre-operative cardiovascular examination 06/13/2018  . Dizziness 02/24/2018  . Left-sided weakness 02/24/2018  . Cigarette smoker 12/31/2017  . DOE (dyspnea on exertion) 12/30/2017  . Phlebitis after infusion 10/28/2017  . Hypertensive emergency 10/21/2017  . Microcytic anemia 10/21/2017  . Medication intolerance 09/06/2017  . Neuroleptic-induced tardive dyskinesia 09/06/2017  . Cancer ([place]) 09/06/2017  . Chronic low back pain 09/06/2017  . Grief reaction with prolonged bereavement  09/06/2017  . Opioid use disorder, severe, dependence ([place]) 08/27/2017  . Alcohol use disorder, severe, dependence ([place]) 08/27/2017  . Substance induced mood disorder ([place]) 08/13/2017  . MDD (major depressive disorder), recurrent severe, without psychosis ([place])   . Alcohol withdrawal ([place]) 08/09/2017  . Essential hypertension 07/06/2017  . Chronic kidney disease, stage III (moderate) ([place]) 11/17/2016  . Depression with anxiety 08/21/2016  . Hx of bipolar disorder 01/31/2016  . Oral aphthous ulcer 06/25/2015  . History of breast cancer 02/13/2013  . Morbid obesity due to excess calories ([place]) 02/13/2013  . Restless legs syndrome 02/13/2013  . Subacute dyskinesia due to drug 02/13/2013  . Dyspnea and respiratory abnormality 02/13/2013  . Hepatitis C virus infection cured after antiviral drug therapy 12/17/2012    Past Surgical History:  Procedure Laterality Date  . BACK SURGERY  1990  . BREAST SURGERY Right 2010   "breast cancer survivor" - states partial mastectomy and nodes  . COLONOSCOPY     [facility]  . ESOPHAGOGASTRODUODENOSCOPY     [facility]  . ESOPHAGOGASTRODUODENOSCOPY  04/18/2020   [facility]  . LAPAROSCOPIC CHOLECYSTECTOMY  2002  . LAPAROSCOPIC INCISIONAL / UMBILICAL / VENTRAL HERNIA REPAIR  04/13/2018   with BARD 15x 44RX mesh (supraumbilical)  . LAPAROSCOPIC LYSIS OF ADHESIONS  07/12/2018   Procedure: LAPAROSCOPIC LYSIS OF ADHESIONS;  Surgeon: Isabel Caprice, MD;  Location: [facility];  Service: Gynecology;;  . MULTIPLE TOOTH EXTRACTIONS    . MYOMECTOMY     x 2 prior to hysterectomy  . ROBOTIC ASSISTED BILATERAL SALPINGO OOPHERECTOMY Right 07/12/2018   Procedure: XI ROBOTIC ASSISTED RIGHT SALPINGO OOPHORECTOMY;  Surgeon: Isabel Caprice, MD;  Location: [facility];  Service: Gynecology;  Laterality: Right;  . TOTAL ABDOMINAL HYSTERECTOMY     fibroids   . UPPER GASTROINTESTINAL ENDOSCOPY    . WISDOM TOOTH EXTRACTION       OB History     Gravida   2   Para  0   Term      Preterm      AB  0   Living         SAB      IAB      Ectopic      Multiple      Live Births              Family History  Problem Relation Age of Onset  . Heart attack Mother   . Breast cancer Mother 36  . Dementia Mother   . Cancer Father 44  died of bleeding from kidneys  . Colon cancer Neg Hx   . Esophageal cancer Neg Hx   . Stomach cancer Neg Hx   . Rectal cancer Neg Hx     Social History   Tobacco Use  . Smoking status: Every Day    Packs/day: 1.00    Types: Cigarettes  . Smokeless tobacco: Never  Vaping Use  . Vaping Use: Former  . Start date: 06/27/2013  . Quit date: 06/14/2017  . Devices: Apple Cinnamon-0 mg  Substance Use Topics  . Alcohol use: Not Currently    Comment: hx over 30 years  . Drug use: Not Currently    Comment: h/o IVD use    Home Medications Prior to Admission medications   Medication Sig Start Date End Date Taking? Authorizing Provider  gabapentin (NEURONTIN) 400 MG capsule Take 1 capsule (400 mg total) by mouth 2 (two) times daily for 15 days. 03/19/21 04/03/21 Yes Bradee Common, Corene Cornea, MD  amLODipine (NORVASC) 10 MG tablet Take 10 mg by mouth daily.    [provider]  Cholecalciferol (VITAMIN D3) 50 MCG (2000 UT) capsule Take 1 capsule (2,000 Units total) by mouth daily. Patient taking differently: Take 2,000 Units by mouth in the morning. 09/26/20   Wendie Agreste, MD  diphenhydramine-acetaminophen (TYLENOL PM) 25-500 MG TABS tablet Take 1 tablet by mouth at bedtime.    [provider]  docusate sodium (COLACE) 100 MG capsule Take 1 capsule (100 mg total) by mouth 2 (two) times daily. 10/23/20   Viona Gilmore D, NP  ferrous sulfate 325 (65 FE) MG tablet Take 1 tablet by mouth daily with breakfast.    [provider]  FLUoxetine (PROZAC) 40 MG capsule Take 40 mg by mouth in the morning. 11/09/18   [provider]  furosemide (LASIX) 20 MG tablet Take 20 mg by  mouth daily. 07/31/20   [provider]  hydrALAZINE (APRESOLINE) 100 MG tablet Take 100 mg by mouth 3 (three) times daily. 09/26/20   [provider]  hyoscyamine (LEVSIN) 0.125 MG tablet TAKE 1 TABLET(0.125 MG) BY MOUTH EVERY 6 HOURS AS NEEDED FOR CRAMPING OR ABDOMINAL PAIN 01/18/21   Wendie Agreste, MD  INGREZZA 80 MG CAPS Take 80 mg by mouth at bedtime. 10/15/17   [provider]  isosorbide mononitrate (IMDUR) 60 MG 24 hr tablet Take 60 mg by mouth daily.    [provider]  labetalol (NORMODYNE) 300 MG tablet Take 300 mg by mouth 2 (two) times daily.    [provider]  lidocaine (LIDODERM) 5 % Place 1 patch onto the skin daily. Remove & Discard patch within 12 hours or as directed by MD 10/10/20   Nuala Alpha A, PA-C  melatonin 5 MG TABS Take 5 mg by mouth at bedtime.    [provider]  methocarbamol (ROBAXIN) 500 MG tablet Take 1 tablet (500 mg total) by mouth 2 (two) times daily. 02/17/21   Jacqlyn Larsen, PA-C  methylPREDNISolone (MEDROL DOSEPAK) 4 MG TBPK tablet Take as directed by mouth, see back of pack for instructions 02/17/21   Jacqlyn Larsen, PA-C  ondansetron (ZOFRAN) 4 MG tablet Take 4 mg by mouth every 8 (eight) hours as needed for nausea/vomiting.    [provider]  pantoprazole (PROTONIX) 20 MG tablet TAKE 1 TABLET(20 MG) BY MOUTH TWICE DAILY 02/07/21   Wendie Agreste, MD  sodium bicarbonate 650 MG tablet Take 650 mg by mouth 2 (two) times daily. 09/04/20  [provider]    Allergies    Abilify [aripiprazole], Remeron [mirtazapine], Trazodone and nefazodone, Clavulanic acid, Flexeril [cyclobenzaprine], and Amoxicillin  Review of Systems   Review of Systems  Constitutional:  Negative for fever.  Gastrointestinal:  Negative for abdominal pain.  Musculoskeletal:  Positive for back pain.  Neurological:  Negative for tingling and weakness.  All other systems reviewed and are negative.  Physical  Exam Updated Vital Signs BP (!) 218/103   Pulse 75   Temp 98.6 F (37 C) (Oral)   Resp 18   Ht 5\' 2"  (1.575 m)   Wt 96.2 kg   SpO2 100%   BMI 38.78 kg/m   Physical Exam Vitals and nursing note reviewed.  Constitutional:      Appearance: She is well-developed.  HENT:     Head: Normocephalic and atraumatic.     Mouth/Throat:     Mouth: Mucous membranes are moist.     Pharynx: Oropharynx is clear.  Eyes:     Pupils: Pupils are equal, round, and reactive to light.  Cardiovascular:     Rate and Rhythm: Normal rate and regular rhythm.  Pulmonary:     Effort: No respiratory distress.     Breath sounds: No stridor.  Abdominal:     General: Abdomen is flat. There is no distension.  Musculoskeletal:        General: No swelling or tenderness. Normal range of motion.     Cervical back: Normal range of motion.  Skin:    General: Skin is warm and dry.  Neurological:     General: No focal deficit present.     Mental Status: She is alert.    ED Results / Procedures / Treatments   Labs (all labs ordered are listed, but only abnormal results are displayed) Labs Reviewed - No data to display  EKG None  Radiology No results found.  Procedures Procedures   Medications Ordered in ED Medications  oxyCODONE-acetaminophen (PERCOCET/ROXICET) 5-325 MG per tablet 2 tablet (2 tablets Oral Given 03/19/21 0348)  gabapentin (NEURONTIN) capsule 400 mg (400 mg Oral Given 03/19/21 0348)    ED Course  I have reviewed the triage vital signs and the nursing notes.  Pertinent labs & imaging results that were available during my care of the patient were reviewed by me and considered in my medical decision making (see chart for details).    MDM Rules/Calculators/A&P                         Recurrent sciatica without red flags. Treat for same with pcp follow up.   Final Clinical Impression(s) / ED Diagnoses Final diagnoses:  Sciatica of right side    Rx / DC Orders ED Discharge Orders           Ordered    gabapentin (NEURONTIN) 400 MG capsule  2 times daily        03/19/21 0344             Pavlos Yon, Corene Cornea, MD 03/19/21 0423

## 2021-03-19 NOTE — ED Triage Notes (Signed)
Pt c/o lower back pain with hx of same

## 2021-03-21 ENCOUNTER — Other Ambulatory Visit (HOSPITAL_COMMUNITY): Payer: Medicare HMO

## 2021-03-21 ENCOUNTER — Encounter (HOSPITAL_COMMUNITY): Payer: Medicare HMO

## 2021-03-21 ENCOUNTER — Encounter: Payer: Medicare HMO | Admitting: Vascular Surgery

## 2021-03-24 ENCOUNTER — Encounter (HOSPITAL_COMMUNITY): Payer: Medicare HMO

## 2021-03-27 NOTE — Progress Notes (Deleted)
Office Note     CC:  ESRD Requesting Provider:  Wynelle Fanny, DO  HPI: Sherry [name] is a *** handed 65 y.o. (01/14/1956) female with kidney disease who presents at the request of Wynelle Fanny, DO for permanent HD access.  The pt *** on a statin for cholesterol management.  The pt *** on a daily aspirin.   Other AC:  *** The pt *** on *** for hypertension.   The pt *** diabetic.  *** Tobacco hx:  ***  Past Medical History:  Diagnosis Date   Alcohol abuse 05/09/2019   Alcohol use disorder, severe, dependence ([place]) 08/27/2017   Alcohol withdrawal ([place]) 08/09/2017   Anxiety    ARF (acute renal failure) ([place]) 04/09/2020   Arthropathy of lumbar facet joint 05/24/2020   Bilateral low back pain with bilateral sciatica 01/05/2019   Bipolar and related disorder ([place])    Blood transfusion without reported diagnosis    Body mass index (BMI) 40.0-44.9, adult ([place]) 07/19/2020   Breast cancer ([place])    right lumpectemy and lymph node    Cancer ([place]) 09/06/2017   Cervical spondylosis 05/24/2020   Chest pain 05/31/2020   Chest pain, rule out acute myocardial infarction 05/29/2020   CHF (congestive heart failure) (HCC)    Chronic back pain    Chronic bilateral low back pain with right-sided sciatica 05/24/2020   Chronic diastolic CHF (congestive heart failure) ([place]) 05/09/2019   Chronic kidney disease    "Stage IV" - states due to hypertension   Chronic kidney disease, stage III (moderate) ([place]) 11/17/2016   Chronic low back pain 09/06/2017   Disc levels:      No abnormality at L2-3 or above.      L3-4:  Mild bulging of the disc.  No stenosis.      L4-5: Bilateral facet arthropathy with gaping, fluid-filled joints.   Joint edema. Anterolisthesis would likely occur at this level with   standing and flexion. Disc degeneration with mild bulging of the   disc. Mild narrowing of the lateral recesses and foramina, which   would likely worsen   Cigarette smoker 12/31/2017   CKD (chronic kidney disease) stage 4,  GFR 15-29 ml/min (HCC) 04/18/2020   Clonus 05/24/2020   Cyst of ovary    Depression    Depression with anxiety 08/21/2016   Dizziness 02/24/2018   DOE (dyspnea on exertion) 12/30/2017   12/30/2017   Walked RA x one lap @ 185 stopped due to  Sob/ lightheaded weak with sats 96% at end - Spirometry 12/30/2017  FEV1 1.27 (68%)  Ratio 99 s curvature / abn effort dep portion only     Dyspnea    Dyspnea and respiratory abnormality 02/13/2013   Formatting of this note might be different from the original. STORY: PSG on 05/20/12 showed no OSA but upper airway resistance syndrome, AHI 3.9, severe snoring was noted. ESS=8. UARS. Advise positional therapy, weight loss program, regular exercise.   Essential hypertension 07/06/2017   GERD (gastroesophageal reflux disease)    Grief reaction with prolonged bereavement 09/06/2017   Partner of 18 yrs left 2018-pt admits she still loves her   Hepatitis C    recieved treatment for Hep C   Hepatitis C virus infection cured after antiviral drug therapy 12/17/2012   Telephone Encounter - Dub Mikes, RN - 12/28/2016 9:54 AM EDT Patient called for HCV RNA test results. EOT 08-26-16 - not detected. 12 week post treatment 12-11-16 - not detected. Informed patient that she was cured.  Patient verbalized understanding.          History of breast cancer 02/13/2013   Hx of bipolar disorder 01/31/2016   Hypertension    Hypertensive emergency 10/21/2017   Impaired functional mobility, balance, gait, and endurance 01/09/2021   Left-sided weakness    MDD (major depressive disorder), recurrent severe, without psychosis ([place])    Medication intolerance 09/06/2017   Remeron-appetite stimulant/Dizziness/Dysphoria   Microcytic anemia 10/21/2017   Morbid obesity due to excess calories ([place]) 02/13/2013   BMI 42.9   MRSA infection    of breast incision   Nausea 04/09/2020   Formatting of this note might be different from the original. Added automatically from request for surgery [id number]   Near  syncope 05/29/2020   Neck pain 05/24/2020   Neuroleptic-induced tardive dyskinesia 09/06/2017   Abilify   Opioid dependence ([place])    Has been treated in [facility] in past   Opioid use disorder, severe, dependence ([place]) 08/27/2017   Oral aphthous ulcer 06/25/2015   Last Assessment & Plan:  Formatting of this note might be different from the original. - Appear to be resolving as a be expected in the case of aphthous stomatitis. - Continue topical therapy with viscous lidocaine.  Recommended trying high-dose anti-inflammatory such as 600 mg of ibuprofen as needed. - She'll return to ENT clinic if symptoms do not resolve.   Phlebitis after infusion 10/28/2017   Rt antecubital fossa   Pre-operative cardiovascular examination 06/13/2018   Restless legs syndrome 02/13/2013   Formatting of this note might be different from the original. IMPRESSION: Possible. Will follow.   Spondylolisthesis, lumbar region 10/21/2020   Subacute dyskinesia due to drug 02/13/2013   Formatting of this note might be different from the original. STORY: Tardive dyskinesia and mild limb dyskinesia, LE>UE which likely due to prolonged antipsychotic used, abilify. Couldn't tolerate artane, gabapentin, clonazepam and xenazine.`E1o3L`IMPRESSION: Well tolerated depakote 250mg  bid, mood aspect is better as well. Will increase to 500mg  bid. ADR was discussed.  RTC 4-6 weeks.   Substance abuse ([place])    Substance induced mood disorder ([place]) 08/13/2017   TIA (transient ischemic attack) 2020   P/w BP >230/120 and neurologic symptoms, presumed TIA    Past Surgical History:  Procedure Laterality Date   BACK SURGERY  1990   BREAST SURGERY Right 2010   "breast cancer survivor" - states partial mastectomy and nodes   COLONOSCOPY     [facility]   ESOPHAGOGASTRODUODENOSCOPY     [facility]   ESOPHAGOGASTRODUODENOSCOPY  04/18/2020   [facility]   LAPAROSCOPIC CHOLECYSTECTOMY  2002   LAPAROSCOPIC INCISIONAL / UMBILICAL /  [place]  04/13/2018   with BARD 15x 88QB mesh (supraumbilical)   LAPAROSCOPIC LYSIS OF ADHESIONS  07/12/2018   Procedure: LAPAROSCOPIC LYSIS OF ADHESIONS;  Surgeon: Isabel Caprice, MD;  Location: [facility];  Service: Gynecology;;   MULTIPLE TOOTH EXTRACTIONS     MYOMECTOMY     x 2 prior to hysterectomy   ROBOTIC ASSISTED BILATERAL SALPINGO OOPHERECTOMY Right 07/12/2018   Procedure: XI ROBOTIC ASSISTED RIGHT SALPINGO OOPHORECTOMY;  Surgeon: Isabel Caprice, MD;  Location: [facility];  Service: Gynecology;  Laterality: Right;   TOTAL ABDOMINAL HYSTERECTOMY     fibroids    UPPER GASTROINTESTINAL ENDOSCOPY     WISDOM TOOTH EXTRACTION      Social History   Socioeconomic History   Marital status: Single    Spouse name: Not on file   Number of children: 0  Years of education: 50   Highest education level: High school graduate  Occupational History   Occupation: Disabled  Tobacco Use   Smoking status: Every Day    Packs/day: 1.00    Types: Cigarettes   Smokeless tobacco: Never  Vaping Use   Vaping Use: Former   Start date: 06/27/2013   Quit date: 06/14/2017   Devices: Apple Cinnamon-0 mg  Substance and Sexual Activity   Alcohol use: Not Currently    Comment: hx over 30 years   Drug use: Not Currently    Comment: h/o IVD use   Sexual activity: Not Currently  Other Topics Concern   Not on file  Social History Narrative   Lives at home alone.   Right-handed.   Caffeine use: 4 cups coffee/soda   Social Determinants of Health   Financial Resource Strain: Not on file  Food Insecurity: Not on file  Transportation Needs: Not on file  Physical Activity: Not on file  Stress: Not on file  Social Connections: Not on file  Intimate Partner Violence: Not on file   *** Family History  Problem Relation Age of Onset   Heart attack Mother    Breast cancer Mother 42   Dementia Mother    Cancer Father 52       died of bleeding from kidneys   Colon cancer Neg Hx     Esophageal cancer Neg Hx    Stomach cancer Neg Hx    Rectal cancer Neg Hx     Current Outpatient Medications  Medication Sig Dispense Refill   amLODipine (NORVASC) 10 MG tablet Take 10 mg by mouth daily.     Cholecalciferol (VITAMIN D3) 50 MCG (2000 UT) capsule Take 1 capsule (2,000 Units total) by mouth daily. (Patient taking differently: Take 2,000 Units by mouth in the morning.) 30 capsule 0   diphenhydramine-acetaminophen (TYLENOL PM) 25-500 MG TABS tablet Take 1 tablet by mouth at bedtime.     docusate sodium (COLACE) 100 MG capsule Take 1 capsule (100 mg total) by mouth 2 (two) times daily. 10 capsule 0   ferrous sulfate 325 (65 FE) MG tablet Take 1 tablet by mouth daily with breakfast.     FLUoxetine (PROZAC) 40 MG capsule Take 40 mg by mouth in the morning.     furosemide (LASIX) 20 MG tablet Take 20 mg by mouth daily.     gabapentin (NEURONTIN) 400 MG capsule Take 1 capsule (400 mg total) by mouth 2 (two) times daily for 15 days. 30 capsule 0   hydrALAZINE (APRESOLINE) 100 MG tablet Take 100 mg by mouth 3 (three) times daily.     hyoscyamine (LEVSIN) 0.125 MG tablet TAKE 1 TABLET(0.125 MG) BY MOUTH EVERY 6 HOURS AS NEEDED FOR CRAMPING OR ABDOMINAL PAIN 30 tablet 0   INGREZZA 80 MG CAPS Take 80 mg by mouth at bedtime.     isosorbide mononitrate (IMDUR) 60 MG 24 hr tablet Take 60 mg by mouth daily.     labetalol (NORMODYNE) 300 MG tablet Take 300 mg by mouth 2 (two) times daily.     lidocaine (LIDODERM) 5 % Place 1 patch onto the skin daily. Remove & Discard patch within 12 hours or as directed by MD 15 patch 0   melatonin 5 MG TABS Take 5 mg by mouth at bedtime.     methocarbamol (ROBAXIN) 500 MG tablet Take 1 tablet (500 mg total) by mouth 2 (two) times daily. 20 tablet 0   methylPREDNISolone (MEDROL DOSEPAK) 4 MG  TBPK tablet Take as directed by mouth, see back of pack for instructions 21 tablet 0   ondansetron (ZOFRAN) 4 MG tablet Take 4 mg by mouth every 8 (eight) hours as needed  for nausea/vomiting.     pantoprazole (PROTONIX) 20 MG tablet TAKE 1 TABLET(20 MG) BY MOUTH TWICE DAILY 60 tablet 0   sodium bicarbonate 650 MG tablet Take 650 mg by mouth 2 (two) times daily.     No current facility-administered medications for this visit.    Allergies  Allergen Reactions   Abilify [Aripiprazole] Other (See Comments)    Tardive dyskinesia Oral   Remeron [Mirtazapine] Other (See Comments)    Wgt stimulation /gain, Dizziness, Patient says "can tolerate"   Trazodone And Nefazodone Other (See Comments)    Nightmares/sleep diturbance   Clavulanic Acid    Flexeril [Cyclobenzaprine] Other (See Comments)    Pt states Flexeril makes her feel depressed    Amoxicillin Diarrhea and Other (See Comments)    NOTE the patient has had PCN WITHOUT reaction Has patient had a PCN reaction causing immediate rash, facial/tongue/throat swelling, SOB or lightheadedness with hypotension: No Has patient had a PCN reaction causing severe rash involving mucus membranes or skin necrosis: No Has patient had a PCN reaction that required hospitalization: No Has patient had a PCN reaction occurring within the last 10 years: No If all of the above answers are "NO", then may proceed with Cephalosporin use.      REVIEW OF SYSTEMS:  *** [X]  denotes positive finding, [ ]  denotes negative finding Cardiac  Comments:  Chest pain or chest pressure:    Shortness of breath upon exertion:    Short of breath when lying flat:    Irregular heart rhythm:        Vascular    Pain in calf, thigh, or hip brought on by ambulation:    Pain in feet at night that wakes you up from your sleep:     Blood clot in your veins:    Leg swelling:         Pulmonary    Oxygen at home:    Productive cough:     Wheezing:         Neurologic    Sudden weakness in arms or legs:     Sudden numbness in arms or legs:     Sudden onset of difficulty speaking or slurred speech:    Temporary loss of vision in one eye:      Problems with dizziness:         Gastrointestinal    Blood in stool:     Vomited blood:         Genitourinary    Burning when urinating:     Blood in urine:        Psychiatric    Major depression:         Hematologic    Bleeding problems:    Problems with blood clotting too easily:        Skin    Rashes or ulcers:        Constitutional    Fever or chills:      PHYSICAL EXAMINATION:  There were no vitals filed for this visit.  General:  WDWN in NAD; vital signs documented above Gait: Not observed HENT: WNL, normocephalic Pulmonary: normal non-labored breathing , without Rales, rhonchi,  wheezing Cardiac: {Desc; regular/irreg:14544} HR, without  Murmurs {With/Without:20273} carotid bruit*** Abdomen: soft, NT, no masses Skin: {With/Without:20273} rashes Vascular Exam/Pulses:  Right Left  Radial {Exam; arterial pulse strength 0-4:30167} {Exam; arterial pulse strength 0-4:30167}  Ulnar {Exam; arterial pulse strength 0-4:30167} {Exam; arterial pulse strength 0-4:30167}  Femoral {Exam; arterial pulse strength 0-4:30167} {Exam; arterial pulse strength 0-4:30167}  Popliteal {Exam; arterial pulse strength 0-4:30167} {Exam; arterial pulse strength 0-4:30167}  DP {Exam; arterial pulse strength 0-4:30167} {Exam; arterial pulse strength 0-4:30167}  PT {Exam; arterial pulse strength 0-4:30167} {Exam; arterial pulse strength 0-4:30167}   Extremities: {With/Without:20273} ischemic changes, {With/Without:20273} Gangrene , {With/Without:20273} cellulitis; {With/Without:20273} open wounds;  Musculoskeletal: no muscle wasting or atrophy  Neurologic: A&O X 3;  No focal weakness or paresthesias are detected Psychiatric:  The pt has {Desc; normal/abnormal:11317::"Normal"} affect.   Non-Invasive Vascular Imaging:   ***    ASSESSMENT/PLAN:: 65 y.o. female presenting with ***   ***   Broadus John, MD [facility] [phone]

## 2021-03-28 ENCOUNTER — Other Ambulatory Visit: Payer: Self-pay | Admitting: *Deleted

## 2021-03-28 ENCOUNTER — Encounter: Payer: Medicare HMO | Admitting: Vascular Surgery

## 2021-03-28 DIAGNOSIS — N184 Chronic kidney disease, stage 4 (severe): Secondary | ICD-10-CM

## 2021-04-02 ENCOUNTER — Encounter (HOSPITAL_COMMUNITY): Payer: Medicare HMO

## 2021-04-03 ENCOUNTER — Other Ambulatory Visit: Payer: Self-pay

## 2021-04-03 ENCOUNTER — Encounter (HOSPITAL_BASED_OUTPATIENT_CLINIC_OR_DEPARTMENT_OTHER): Payer: Self-pay | Admitting: *Deleted

## 2021-04-03 ENCOUNTER — Inpatient Hospital Stay (HOSPITAL_BASED_OUTPATIENT_CLINIC_OR_DEPARTMENT_OTHER)
Admission: EM | Admit: 2021-04-03 | Discharge: 2021-04-07 | DRG: 640 | Disposition: A | Discharge status: Home Health | Payer: Medicare HMO | Attending: Internal Medicine | Admitting: Internal Medicine

## 2021-04-03 ENCOUNTER — Emergency Department (HOSPITAL_BASED_OUTPATIENT_CLINIC_OR_DEPARTMENT_OTHER): Payer: Medicare HMO

## 2021-04-03 DIAGNOSIS — K219 Gastro-esophageal reflux disease without esophagitis: Secondary | ICD-10-CM | POA: Diagnosis not present

## 2021-04-03 DIAGNOSIS — Z8673 Personal history of transient ischemic attack (TIA), and cerebral infarction without residual deficits: Secondary | ICD-10-CM | POA: Diagnosis not present

## 2021-04-03 DIAGNOSIS — M545 Low back pain, unspecified: Secondary | ICD-10-CM | POA: Diagnosis not present

## 2021-04-03 DIAGNOSIS — N184 Chronic kidney disease, stage 4 (severe): Secondary | ICD-10-CM | POA: Diagnosis not present

## 2021-04-03 DIAGNOSIS — G8929 Other chronic pain: Secondary | ICD-10-CM | POA: Diagnosis not present

## 2021-04-03 DIAGNOSIS — I5032 Chronic diastolic (congestive) heart failure: Secondary | ICD-10-CM | POA: Diagnosis present

## 2021-04-03 DIAGNOSIS — F17210 Nicotine dependence, cigarettes, uncomplicated: Secondary | ICD-10-CM | POA: Diagnosis not present

## 2021-04-03 DIAGNOSIS — U071 COVID-19: Secondary | ICD-10-CM | POA: Diagnosis present

## 2021-04-03 DIAGNOSIS — Z888 Allergy status to other drugs, medicaments and biological substances status: Secondary | ICD-10-CM | POA: Diagnosis not present

## 2021-04-03 DIAGNOSIS — Z79899 Other long term (current) drug therapy: Secondary | ICD-10-CM | POA: Diagnosis not present

## 2021-04-03 DIAGNOSIS — I5042 Chronic combined systolic (congestive) and diastolic (congestive) heart failure: Secondary | ICD-10-CM | POA: Diagnosis present

## 2021-04-03 DIAGNOSIS — Z880 Allergy status to penicillin: Secondary | ICD-10-CM

## 2021-04-03 DIAGNOSIS — G2401 Drug induced subacute dyskinesia: Secondary | ICD-10-CM | POA: Diagnosis present

## 2021-04-03 DIAGNOSIS — Z8249 Family history of ischemic heart disease and other diseases of the circulatory system: Secondary | ICD-10-CM | POA: Diagnosis not present

## 2021-04-03 DIAGNOSIS — Z8659 Personal history of other mental and behavioral disorders: Secondary | ICD-10-CM

## 2021-04-03 DIAGNOSIS — Z6839 Body mass index (BMI) 39.0-39.9, adult: Secondary | ICD-10-CM | POA: Diagnosis not present

## 2021-04-03 DIAGNOSIS — E861 Hypovolemia: Secondary | ICD-10-CM | POA: Diagnosis present

## 2021-04-03 DIAGNOSIS — Z853 Personal history of malignant neoplasm of breast: Secondary | ICD-10-CM | POA: Diagnosis not present

## 2021-04-03 DIAGNOSIS — E875 Hyperkalemia: Principal | ICD-10-CM | POA: Diagnosis present

## 2021-04-03 DIAGNOSIS — I130 Hypertensive heart and chronic kidney disease with heart failure and stage 1 through stage 4 chronic kidney disease, or unspecified chronic kidney disease: Secondary | ICD-10-CM | POA: Diagnosis not present

## 2021-04-03 DIAGNOSIS — N185 Chronic kidney disease, stage 5: Secondary | ICD-10-CM

## 2021-04-03 DIAGNOSIS — F319 Bipolar disorder, unspecified: Secondary | ICD-10-CM | POA: Diagnosis not present

## 2021-04-03 DIAGNOSIS — Z803 Family history of malignant neoplasm of breast: Secondary | ICD-10-CM | POA: Diagnosis not present

## 2021-04-03 DIAGNOSIS — N179 Acute kidney failure, unspecified: Secondary | ICD-10-CM | POA: Diagnosis present

## 2021-04-03 DIAGNOSIS — I160 Hypertensive urgency: Secondary | ICD-10-CM | POA: Diagnosis present

## 2021-04-03 DIAGNOSIS — G2581 Restless legs syndrome: Secondary | ICD-10-CM | POA: Diagnosis present

## 2021-04-03 LAB — CBG MONITORING, ED
Glucose-Capillary: 231 mg/dL — ABNORMAL HIGH (ref 70–99)
Glucose-Capillary: 41 mg/dL — CL (ref 70–99)
Glucose-Capillary: 127 mg/dL — ABNORMAL HIGH (ref 70–99)
Glucose-Capillary: 121 mg/dL — ABNORMAL HIGH (ref 70–99)

## 2021-04-03 LAB — BASIC METABOLIC PANEL
Anion gap: 7 (ref 5–15)
BUN: 33 mg/dL — ABNORMAL HIGH (ref 8–23)
CO2: 19 mmol/L — ABNORMAL LOW (ref 22–32)
Calcium: 9.1 mg/dL (ref 8.9–10.3)
Chloride: 109 mmol/L (ref 98–111)
Creatinine, Ser: 3.54 mg/dL — ABNORMAL HIGH (ref 0.44–1.00)
GFR, Estimated: 14 mL/min — ABNORMAL LOW (ref >60)
Glucose, Bld: 88 mg/dL (ref 70–99)
Potassium: 6.7 mmol/L (ref 3.5–5.1)
Sodium: 135 mmol/L (ref 135–145)

## 2021-04-03 LAB — CBC WITH DIFFERENTIAL/PLATELET
Abs Immature Granulocytes: 0.01 10*3/uL (ref 0.00–0.07)
Basophils Absolute: 0 10*3/uL (ref 0.0–0.1)
Basophils Relative: 0 %
Eosinophils Absolute: 0.1 10*3/uL (ref 0.0–0.5)
Eosinophils Relative: 2 %
HCT: 32.4 % — ABNORMAL LOW (ref 36.0–46.0)
Hemoglobin: 10.4 g/dL — ABNORMAL LOW (ref 12.0–15.0)
Immature Granulocytes: 0 %
Lymphocytes Relative: 38 %
Lymphs Abs: 1.4 10*3/uL (ref 0.7–4.0)
MCH: 28.6 pg (ref 26.0–34.0)
MCHC: 32.1 g/dL (ref 30.0–36.0)
MCV: 89 fL (ref 80.0–100.0)
Monocytes Absolute: 0.3 10*3/uL (ref 0.1–1.0)
Monocytes Relative: 8 %
Neutro Abs: 1.9 10*3/uL (ref 1.7–7.7)
Neutrophils Relative %: 52 %
Platelets: 262 10*3/uL (ref 150–400)
RBC: 3.64 MIL/uL — ABNORMAL LOW (ref 3.87–5.11)
RDW: 16.9 % — ABNORMAL HIGH (ref 11.5–15.5)
WBC: 3.7 10*3/uL — ABNORMAL LOW (ref 4.0–10.5)
nRBC: 0 % (ref 0.0–0.2)

## 2021-04-03 LAB — RESP PANEL BY RT-PCR (FLU A&B, COVID) ARPGX2
Influenza A by PCR: NEGATIVE
Influenza B by PCR: NEGATIVE
SARS Coronavirus 2 by RT PCR: POSITIVE — AB

## 2021-04-03 LAB — BRAIN NATRIURETIC PEPTIDE: B Natriuretic Peptide: 187.8 pg/mL — ABNORMAL HIGH (ref 0.0–100.0)

## 2021-04-03 LAB — POTASSIUM: Potassium: 5.3 mmol/L — ABNORMAL HIGH (ref 3.5–5.1)

## 2021-04-03 LAB — TROPONIN I (HIGH SENSITIVITY)
Troponin I (High Sensitivity): 7 ng/L (ref <18)
Troponin I (High Sensitivity): 8 ng/L (ref <18)

## 2021-04-03 MED ORDER — HEPARIN SODIUM (PORCINE) 5000 UNIT/ML IJ SOLN
5000.0000 [IU] | Freq: Three times a day (TID) | INTRAMUSCULAR | Status: DC
  Administered 2021-04-03 – 2021-04-07 (×12): 5000 [IU] via SUBCUTANEOUS
  Filled 2021-04-03 (×12): qty 1

## 2021-04-03 MED ORDER — SODIUM CHLORIDE 0.9 % IV SOLN: 1.0000 g | Freq: Once | INTRAVENOUS | Status: DC

## 2021-04-03 MED ORDER — HYDRALAZINE HCL 20 MG/ML IJ SOLN
10.0000 mg | Freq: Once | INTRAMUSCULAR | Status: AC
  Administered 2021-04-03: 10 mg via INTRAVENOUS
  Filled 2021-04-03: qty 1

## 2021-04-03 MED ORDER — DEXTROSE 50 % IV SOLN
1.0000 | Freq: Once | INTRAVENOUS | Status: AC
  Administered 2021-04-03: 50 mL via INTRAVENOUS
  Filled 2021-04-03: qty 50

## 2021-04-03 MED ORDER — OXYCODONE-ACETAMINOPHEN 5-325 MG PO TABS
2.0000 | ORAL_TABLET | Freq: Once | ORAL | Status: AC
  Administered 2021-04-03: 2 via ORAL
  Filled 2021-04-03: qty 2

## 2021-04-03 MED ORDER — HYDRALAZINE HCL 25 MG PO TABS
100.0000 mg | ORAL_TABLET | Freq: Once | ORAL | Status: AC
  Administered 2021-04-03: 100 mg via ORAL
  Filled 2021-04-03: qty 4

## 2021-04-03 MED ORDER — ALBUTEROL SULFATE HFA 108 (90 BASE) MCG/ACT IN AERS
4.0000 | INHALATION_SPRAY | Freq: Once | RESPIRATORY_TRACT | Status: AC
  Administered 2021-04-03: 4 via RESPIRATORY_TRACT
  Filled 2021-04-03: qty 6.7

## 2021-04-03 MED ORDER — INSULIN ASPART 100 UNIT/ML IV SOLN
10.0000 [IU] | Freq: Once | INTRAVENOUS | Status: AC
  Administered 2021-04-03: 10 [IU] via INTRAVENOUS

## 2021-04-03 MED ORDER — CALCIUM GLUCONATE-NACL 1-0.675 GM/50ML-% IV SOLN
1.0000 g | Freq: Once | INTRAVENOUS | Status: AC
  Administered 2021-04-03: 1000 mg via INTRAVENOUS
  Filled 2021-04-03: qty 50

## 2021-04-03 MED ORDER — SODIUM CHLORIDE 0.9 % IV BOLUS
500.0000 mL | Freq: Once | INTRAVENOUS | Status: AC
  Administered 2021-04-03: 500 mL via INTRAVENOUS

## 2021-04-03 NOTE — ED Triage Notes (Signed)
C/o covid + home test x 1 day ago sx x 3 days, c/o pro cough , body aches , h/a

## 2021-04-03 NOTE — ED Notes (Signed)
Late Entry: 1745hrs  Attempted IV access x 2, first attempt to rt hand, unable to thread catheter. Second attempt via ultrasound IV to rt brachial area, medial aspect, unable to again thread 20g catheter into venous site. Catheter withdrawn, CMS or RUE assessed and was WNL. Dsg applied to sites. ED Provider informed of unsuccessful IV attempts.

## 2021-04-03 NOTE — ED Provider Notes (Addendum)
[facility] EMERGENCY DEPARTMENT Provider Note   CSN: [id number] Arrival date & time: 04/03/21  1331     History Chief Complaint  Patient presents with   Covid Positive    Sherry [name] is a 65 y.o. female.  Presents to the emergency room with concern for multiple symptoms.  Reports that last week Wednesday she started having symptoms.  Initially mild, cough, congestion.  Then feels like she is having more shortness of breath since yesterday.  Also having intermittent mild chest discomfort, described as chest tightness.  Not associated with exertion.  Occurring at rest.  Cough is mostly nonproductive.  No blood.  No leg pain or leg swelling.  Had positive COVID home test yesterday.  Also having some body aches.  Reports that she has a history of heart failure, CKD.  Per review of chart, had normal stress test in January 2022.  Echocardiogram shows LVEF 60 to 65%.  Had a few episodes of loose stools.  No blood.    HPI     Past Medical History:  Diagnosis Date   Alcohol abuse 05/09/2019   Alcohol use disorder, severe, dependence ([place]) 08/27/2017   Alcohol withdrawal ([place]) 08/09/2017   Anxiety    ARF (acute renal failure) ([place]) 04/09/2020   Arthropathy of lumbar facet joint 05/24/2020   Bilateral low back pain with bilateral sciatica 01/05/2019   Bipolar and related disorder ([place])    Blood transfusion without reported diagnosis    Body mass index (BMI) 40.0-44.9, adult ([place]) 07/19/2020   Breast cancer ([place])    right lumpectemy and lymph node    Cancer ([place]) 09/06/2017   Cervical spondylosis 05/24/2020   Chest pain 05/31/2020   Chest pain, rule out acute myocardial infarction 05/29/2020   CHF (congestive heart failure) (HCC)    Chronic back pain    Chronic bilateral low back pain with right-sided sciatica 05/24/2020   Chronic diastolic CHF (congestive heart failure) ([place]) 05/09/2019   Chronic kidney disease    "Stage IV" - states due to hypertension   Chronic kidney  disease, stage III (moderate) ([place]) 11/17/2016   Chronic low back pain 09/06/2017   Disc levels:      No abnormality at L2-3 or above.      L3-4:  Mild bulging of the disc.  No stenosis.      L4-5: Bilateral facet arthropathy with gaping, fluid-filled joints.   Joint edema. Anterolisthesis would likely occur at this level with   standing and flexion. Disc degeneration with mild bulging of the   disc. Mild narrowing of the lateral recesses and foramina, which   would likely worsen   Cigarette smoker 12/31/2017   CKD (chronic kidney disease) stage 4, GFR 15-29 ml/min (HCC) 04/18/2020   Clonus 05/24/2020   Cyst of ovary    Depression    Depression with anxiety 08/21/2016   Dizziness 02/24/2018   DOE (dyspnea on exertion) 12/30/2017   12/30/2017   Walked RA x one lap @ 185 stopped due to  Sob/ lightheaded weak with sats 96% at end - Spirometry 12/30/2017  FEV1 1.27 (68%)  Ratio 99 s curvature / abn effort dep portion only     Dyspnea    Dyspnea and respiratory abnormality 02/13/2013   Formatting of this note might be different from the original. STORY: PSG on 05/20/12 showed no OSA but upper airway resistance syndrome, AHI 3.9, severe snoring was noted. ESS=8. UARS. Advise positional therapy, weight loss program, regular exercise.   Essential hypertension  07/06/2017   GERD (gastroesophageal reflux disease)    Grief reaction with prolonged bereavement 09/06/2017   Partner of 18 yrs left 2018-pt admits she still loves her   Hepatitis C    recieved treatment for Hep C   Hepatitis C virus infection cured after antiviral drug therapy 12/17/2012   Telephone Encounter - Dub Mikes, RN - 12/28/2016 9:54 AM EDT Patient called for HCV RNA test results. EOT 08-26-16 - not detected. 12 week post treatment 12-11-16 - not detected. Informed patient that she was cured. Patient verbalized understanding.          History of breast cancer 02/13/2013   Hx of bipolar disorder 01/31/2016   Hypertension    Hypertensive emergency  10/21/2017   Impaired functional mobility, balance, gait, and endurance 01/09/2021   Left-sided weakness    MDD (major depressive disorder), recurrent severe, without psychosis ([place])    Medication intolerance 09/06/2017   Remeron-appetite stimulant/Dizziness/Dysphoria   Microcytic anemia 10/21/2017   Morbid obesity due to excess calories ([place]) 02/13/2013   BMI 42.9   MRSA infection    of breast incision   Nausea 04/09/2020   Formatting of this note might be different from the original. Added automatically from request for surgery [id number]   Near syncope 05/29/2020   Neck pain 05/24/2020   Neuroleptic-induced tardive dyskinesia 09/06/2017   Abilify   Opioid dependence ([place])    Has been treated in [facility] in past   Opioid use disorder, severe, dependence ([place]) 08/27/2017   Oral aphthous ulcer 06/25/2015   Last Assessment & Plan:  Formatting of this note might be different from the original. - Appear to be resolving as a be expected in the case of aphthous stomatitis. - Continue topical therapy with viscous lidocaine.  Recommended trying high-dose anti-inflammatory such as 600 mg of ibuprofen as needed. - She'll return to ENT clinic if symptoms do not resolve.   Phlebitis after infusion 10/28/2017   Rt antecubital fossa   Pre-operative cardiovascular examination 06/13/2018   Restless legs syndrome 02/13/2013   Formatting of this note might be different from the original. IMPRESSION: Possible. Will follow.   Spondylolisthesis, lumbar region 10/21/2020   Subacute dyskinesia due to drug 02/13/2013   Formatting of this note might be different from the original. STORY: Tardive dyskinesia and mild limb dyskinesia, LE>UE which likely due to prolonged antipsychotic used, abilify. Couldn't tolerate artane, gabapentin, clonazepam and xenazine.`E1o3L`IMPRESSION: Well tolerated depakote 250mg  bid, mood aspect is better as well. Will increase to 500mg  bid. ADR was discussed.  RTC 4-6 weeks.   Substance abuse  ([place])    Substance induced mood disorder ([place]) 08/13/2017   TIA (transient ischemic attack) 2020   P/w BP >230/120 and neurologic symptoms, presumed TIA    Patient Active Problem List   Diagnosis Date Noted   Impaired functional mobility, balance, gait, and endurance 01/09/2021   Spondylolisthesis, lumbar region 10/21/2020   Anxiety    Bipolar and related disorder ([place])    Blood transfusion without reported diagnosis    Breast cancer ([place])    CHF (congestive heart failure) (HCC)    Chronic back pain    Chronic kidney disease    Depression    Dyspnea    Hepatitis C    MRSA infection    Opioid dependence ([place])    Substance abuse ([place])    Body mass index (BMI) 40.0-44.9, adult ([place]) 07/19/2020   Chest pain 05/31/2020   Near syncope 05/29/2020   Chest pain, rule out  acute myocardial infarction 05/29/2020   Cervical spondylosis 05/24/2020   Clonus 05/24/2020   Neck pain 05/24/2020   Chronic bilateral low back pain with right-sided sciatica 05/24/2020   Arthropathy of lumbar facet joint 05/24/2020   CKD (chronic kidney disease) stage 4, GFR 15-29 ml/min (HCC) 04/18/2020   ARF (acute renal failure) ([place]) 04/09/2020   Nausea 04/09/2020   Chronic diastolic CHF (congestive heart failure) ([place]) 05/09/2019   GERD (gastroesophageal reflux disease) 05/09/2019   Alcohol abuse 05/09/2019   Hypertension 05/08/2019   Bilateral low back pain with bilateral sciatica 01/05/2019   TIA (transient ischemic attack) 2020   Cyst of ovary    Pre-operative cardiovascular examination 06/13/2018   Dizziness 02/24/2018   Left-sided weakness 02/24/2018   Cigarette smoker 12/31/2017   DOE (dyspnea on exertion) 12/30/2017   Phlebitis after infusion 10/28/2017   Hypertensive emergency 10/21/2017   Microcytic anemia 10/21/2017   Medication intolerance 09/06/2017   Neuroleptic-induced tardive dyskinesia 09/06/2017   Cancer ([place]) 09/06/2017   Chronic low back pain 09/06/2017   Grief reaction with  prolonged bereavement 09/06/2017   Opioid use disorder, severe, dependence ([place]) 08/27/2017   Alcohol use disorder, severe, dependence ([place]) 08/27/2017   Substance induced mood disorder ([place]) 08/13/2017   MDD (major depressive disorder), recurrent severe, without psychosis ([place])    Alcohol withdrawal ([place]) 08/09/2017   Essential hypertension 07/06/2017   Chronic kidney disease, stage III (moderate) ([place]) 11/17/2016   Depression with anxiety 08/21/2016   Hx of bipolar disorder 01/31/2016   Oral aphthous ulcer 06/25/2015   History of breast cancer 02/13/2013   Morbid obesity due to excess calories ([place]) 02/13/2013   Restless legs syndrome 02/13/2013   Subacute dyskinesia due to drug 02/13/2013   Dyspnea and respiratory abnormality 02/13/2013   Hepatitis C virus infection cured after antiviral drug therapy 12/17/2012    Past Surgical History:  Procedure Laterality Date   BACK SURGERY  1990   BREAST SURGERY Right 2010   "breast cancer survivor" - states partial mastectomy and nodes   COLONOSCOPY     [facility]   ESOPHAGOGASTRODUODENOSCOPY     [facility]   ESOPHAGOGASTRODUODENOSCOPY  04/18/2020   [facility]   LAPAROSCOPIC CHOLECYSTECTOMY  2002   LAPAROSCOPIC INCISIONAL / UMBILICAL / [place]  04/13/2018   with BARD 15x 02RK mesh (supraumbilical)   LAPAROSCOPIC LYSIS OF ADHESIONS  07/12/2018   Procedure: LAPAROSCOPIC LYSIS OF ADHESIONS;  Surgeon: Isabel Caprice, MD;  Location: [facility];  Service: Gynecology;;   MULTIPLE TOOTH EXTRACTIONS     MYOMECTOMY     x 2 prior to hysterectomy   ROBOTIC ASSISTED BILATERAL SALPINGO OOPHERECTOMY Right 07/12/2018   Procedure: XI ROBOTIC ASSISTED RIGHT SALPINGO OOPHORECTOMY;  Surgeon: Isabel Caprice, MD;  Location: [facility];  Service: Gynecology;  Laterality: Right;   TOTAL ABDOMINAL HYSTERECTOMY     fibroids    UPPER GASTROINTESTINAL ENDOSCOPY     WISDOM TOOTH EXTRACTION       OB History     Gravida  2    Para  0   Term      Preterm      AB  0   Living         SAB      IAB      Ectopic      Multiple      Live Births              Family History  Problem Relation Age  of Onset   Heart attack Mother    Breast cancer Mother 28   Dementia Mother    Cancer Father 13       died of bleeding from kidneys   Colon cancer Neg Hx    Esophageal cancer Neg Hx    Stomach cancer Neg Hx    Rectal cancer Neg Hx     Social History   Tobacco Use   Smoking status: Every Day    Packs/day: 1.00    Types: Cigarettes   Smokeless tobacco: Never  Vaping Use   Vaping Use: Former   Start date: 06/27/2013   Quit date: 06/14/2017   Devices: Apple Cinnamon-0 mg  Substance Use Topics   Alcohol use: Not Currently    Comment: hx over 30 years   Drug use: Not Currently    Comment: h/o IVD use    Home Medications Prior to Admission medications   Medication Sig Start Date End Date Taking? Authorizing Provider  amLODipine (NORVASC) 10 MG tablet Take 10 mg by mouth daily.    [provider]  Cholecalciferol (VITAMIN D3) 50 MCG (2000 UT) capsule Take 1 capsule (2,000 Units total) by mouth daily. Patient taking differently: Take 2,000 Units by mouth in the morning. 09/26/20   Wendie Agreste, MD  diphenhydramine-acetaminophen (TYLENOL PM) 25-500 MG TABS tablet Take 1 tablet by mouth at bedtime.    [provider]  docusate sodium (COLACE) 100 MG capsule Take 1 capsule (100 mg total) by mouth 2 (two) times daily. 10/23/20   Viona Gilmore D, NP  ferrous sulfate 325 (65 FE) MG tablet Take 1 tablet by mouth daily with breakfast.    [provider]  FLUoxetine (PROZAC) 40 MG capsule Take 40 mg by mouth in the morning. 11/09/18   [provider]  furosemide (LASIX) 20 MG tablet Take 20 mg by mouth daily. 07/31/20   [provider]  gabapentin (NEURONTIN) 400 MG capsule Take 1 capsule (400 mg total) by mouth 2 (two) times daily for 15 days. 03/19/21 04/03/21   Mesner, Corene Cornea, MD  hydrALAZINE (APRESOLINE) 100 MG tablet Take 100 mg by mouth 3 (three) times daily. 09/26/20   [provider]  hyoscyamine (LEVSIN) 0.125 MG tablet TAKE 1 TABLET(0.125 MG) BY MOUTH EVERY 6 HOURS AS NEEDED FOR CRAMPING OR ABDOMINAL PAIN 01/18/21   Wendie Agreste, MD  INGREZZA 80 MG CAPS Take 80 mg by mouth at bedtime. 10/15/17   [provider]  isosorbide mononitrate (IMDUR) 60 MG 24 hr tablet Take 60 mg by mouth daily.    [provider]  labetalol (NORMODYNE) 300 MG tablet Take 300 mg by mouth 2 (two) times daily.    [provider]  lidocaine (LIDODERM) 5 % Place 1 patch onto the skin daily. Remove & Discard patch within 12 hours or as directed by MD 10/10/20   Nuala Alpha A, PA-C  melatonin 5 MG TABS Take 5 mg by mouth at bedtime.    [provider]  methocarbamol (ROBAXIN) 500 MG tablet Take 1 tablet (500 mg total) by mouth 2 (two) times daily. 02/17/21   Jacqlyn Larsen, PA-C  methylPREDNISolone (MEDROL DOSEPAK) 4 MG TBPK tablet Take as directed by mouth, see back of pack for instructions 02/17/21   Jacqlyn Larsen, PA-C  ondansetron (ZOFRAN) 4 MG tablet Take 4 mg by mouth every 8 (eight) hours as needed for nausea/vomiting.    [provider]  pantoprazole (PROTONIX) 20 MG tablet TAKE 1  TABLET(20 MG) BY MOUTH TWICE DAILY 02/07/21   Wendie Agreste, MD  sodium bicarbonate 650 MG tablet Take 650 mg by mouth 2 (two) times daily. 09/04/20   [provider]    Allergies    Abilify [aripiprazole], Remeron [mirtazapine], Trazodone and nefazodone, Clavulanic acid, Flexeril [cyclobenzaprine], and Amoxicillin  Review of Systems   Review of Systems  Constitutional:  Positive for fatigue. Negative for chills and fever.  HENT:  Negative for ear pain and sore throat.   Eyes:  Negative for pain and visual disturbance.  Respiratory:  Positive for cough, chest tightness and shortness of breath.   Cardiovascular:  Positive  for chest pain. Negative for palpitations.  Gastrointestinal:  Negative for abdominal pain and vomiting.  Genitourinary:  Negative for dysuria and hematuria.  Musculoskeletal:  Negative for arthralgias and back pain.  Skin:  Negative for color change and rash.  Neurological:  Negative for seizures and syncope.  All other systems reviewed and are negative.  Physical Exam Updated Vital Signs BP (!) 209/75   Pulse 66   Temp 98.5 F (36.9 C) (Oral)   Resp 18   Ht 5\' 2"  (1.575 m)   Wt 97.1 kg   SpO2 100%   BMI 39.14 kg/m   Physical Exam Vitals and nursing note reviewed.  Constitutional:      General: She is not in acute distress.    Appearance: She is well-developed.  HENT:     Head: Normocephalic and atraumatic.  Eyes:     Conjunctiva/sclera: Conjunctivae normal.  Cardiovascular:     Rate and Rhythm: Normal rate and regular rhythm.     Heart sounds: No murmur heard. Pulmonary:     Effort: Pulmonary effort is normal. No respiratory distress.     Breath sounds: Normal breath sounds.  Abdominal:     Palpations: Abdomen is soft.     Tenderness: There is no abdominal tenderness.  Musculoskeletal:     Cervical back: Neck supple.  Skin:    General: Skin is warm and dry.  Neurological:     General: No focal deficit present.     Mental Status: She is alert.  Psychiatric:        Mood and Affect: Mood normal.      ED Results / Procedures / Treatments   Labs (all labs ordered are listed, but only abnormal results are displayed) Labs Reviewed  BRAIN NATRIURETIC PEPTIDE - Abnormal; Notable for the following components:      Result Value   B Natriuretic Peptide 187.8 (*)    All other components within normal limits  BASIC METABOLIC PANEL - Abnormal; Notable for the following components:   Potassium 6.7 (*)    CO2 19 (*)    BUN 33 (*)    Creatinine, Ser 3.54 (*)    GFR, Estimated 14 (*)    All other components within normal limits  CBC WITH DIFFERENTIAL/PLATELET -  Abnormal; Notable for the following components:   WBC 3.7 (*)    RBC 3.64 (*)    Hemoglobin 10.4 (*)    HCT 32.4 (*)    RDW 16.9 (*)    All other components within normal limits  CBG MONITORING, ED - Abnormal; Notable for the following components:   Glucose-Capillary 231 (*)    All other components within normal limits  RESP PANEL BY RT-PCR (FLU A&B, COVID) [id number]  TROPONIN I (HIGH SENSITIVITY)  TROPONIN I (HIGH SENSITIVITY)    EKG EKG Interpretation  Date/Time:  Thursday  April 03 2021 16:03:48 EDT Ventricular Rate:  54 PR Interval:  148 QRS Duration: 88 QT Interval:  440 QTC Calculation: 417 R Axis:   13 Text Interpretation: Sinus bradycardia Nonspecific ST abnormality Abnormal ECG Confirmed by Madalyn Rob [id number]) on 04/03/2021 5:29:40 PM  Radiology DG Chest Portable 1 View  Result Date: 04/03/2021 CLINICAL DATA:  covid + EXAM: PORTABLE CHEST 1 VIEW COMPARISON:  None. FINDINGS: Cardiomegaly. No pleural effusion. No pneumothorax. No mass or consolidation. Partially visualized cervical fusion hardware. IMPRESSION: Cardiomegaly.  No focal pulmonary process. Electronically Signed   By: Albin Felling M.D.   On: 04/03/2021 14:47    Procedures .Critical Care Performed by: Lucrezia Starch, MD Authorized by: Lucrezia Starch, MD   Critical care provider statement:    Critical care time (minutes):  43   Critical care was time spent personally by me on the following activities:  Discussions with consultants, evaluation of patient's response to treatment, examination of patient, ordering and performing treatments and interventions, ordering and review of laboratory studies, ordering and review of radiographic studies, pulse oximetry, re-evaluation of patient's condition, obtaining history from patient or surrogate and review of old charts Ultrasound ED Peripheral IV (Provider)  Date/Time: 04/03/2021 7:07 PM Performed by: Lucrezia Starch, MD Authorized by: Lucrezia Starch, MD   Procedure details:    Indications: hydration, multiple failed IV attempts and poor IV access     Skin Prep: chlorhexidine gluconate     Location:  Left AC   Angiocath:  20 G   Bedside Ultrasound Guided: Yes     Images: not archived     Patient tolerated procedure without complications: Yes     Dressing applied: Yes     Medications Ordered in ED Medications  calcium gluconate 1 g/ 50 mL sodium chloride IVPB (1,000 mg Intravenous New Bag/Given 04/03/21 1845)  oxyCODONE-acetaminophen (PERCOCET/ROXICET) 5-325 MG per tablet 2 tablet (2 tablets Oral Given 04/03/21 1608)  insulin aspart (novoLOG) injection 10 Units (10 Units Intravenous Given 04/03/21 1831)    And  dextrose 50 % solution 50 mL (50 mLs Intravenous Given 04/03/21 1819)  albuterol (VENTOLIN HFA) 108 (90 Base) MCG/ACT inhaler 4 puff (4 puffs Inhalation Given 04/03/21 1822)    ED Course  I have reviewed the triage vital signs and the nursing notes.  Pertinent labs & imaging results that were available during my care of the patient were reviewed by me and considered in my medical decision making (see chart for details).    MDM Rules/Calculators/A&P                           65 year old lady presenting to ER with concern for multiple complaints including malaise, congestion, chest discomfort, shortness of breath.  Reported at home COVID test positive.  On exam patient well-appearing in no distress.  Given medical comorbidities and symptomatology, check basic labs including Trope, BNP and checked EKG.  Question mild peaked T waves and lab work concerning for hyperkalemia.  Creatinine is at baseline and patient had been tolerating p.o.  Will give insulin/D50, albuterol, calcium.  Will consult medicine for admission for further management and observation.   Final Clinical Impression(s) / ED Diagnoses Final diagnoses:  COVID-19  Hyperkalemia    Rx / DC Orders ED Discharge Orders     None        Lucrezia Starch, MD 04/03/21 1837    Lucrezia Starch, MD 04/03/21 1907

## 2021-04-03 NOTE — Progress Notes (Signed)
Patient admitted to [facility] from [facility] ED. No complaint of pain or discomfort. Yellow MEW protocol initiated upon admission.Kennon Holter NP notified. Pt oriented to call bell. Educated on calling for assistance. Bed low call bell within reach.

## 2021-04-03 NOTE — Progress Notes (Signed)
   04/03/21 2229  Assess: MEWS Score  Temp 97.8 F (36.6 C)  BP (!) 210/96  Pulse Rate 76  Resp 18  SpO2 100 %  Assess: MEWS Score  MEWS Temp 0  MEWS Systolic 2  MEWS Pulse 0  MEWS RR 0  MEWS LOC 0  MEWS Score 2  MEWS Score Color Yellow  Assess: if the MEWS score is Yellow or Red  Were vital signs taken at a resting state? Yes  Focused Assessment No change from prior assessment  Does the patient meet 2 or more of the SIRS criteria? No  MEWS guidelines implemented *See Row Information* Yes  Treat  MEWS Interventions Administered prn meds/treatments  Pain Scale 0-10  Pain Score 0  Take Vital Signs  Increase Vital Sign Frequency  Yellow: Q 2hr X 2 then Q 4hr X 2, if remains yellow, continue Q 4hrs  Notify: Charge Nurse/RN  Name of Charge Nurse/RN Notified Tom RN  Date Charge Nurse/RN Notified 04/03/21  Time Charge Nurse/RN Notified 2338  Notify: Provider  Provider Name/Title Jeannette Corpus NP  Date Provider Notified 04/03/21  Time Provider Notified 2230  Notification Type Page (text page)  Notification Reason Other (Comment) (new admission with pervious MEWS from [facility] ED)  Provider response See new orders  Date of Provider Response 04/03/21  Time of Provider Response 2233  Document  Patient Outcome Other (Comment) (paitent remains stable)  Progress note created (see row info) Yes  Assess: SIRS CRITERIA  SIRS Temperature  0  SIRS Pulse 0  SIRS Respirations  0  SIRS WBC 0  SIRS Score Sum  0

## 2021-04-03 NOTE — ED Notes (Signed)
Multiple IV start attempts including ultrasound attempts. Provider at bedside attempting IV start . Meds  are delayed .

## 2021-04-04 ENCOUNTER — Encounter (HOSPITAL_COMMUNITY): Payer: Medicare HMO

## 2021-04-04 ENCOUNTER — Other Ambulatory Visit (HOSPITAL_COMMUNITY): Payer: Medicare HMO

## 2021-04-04 ENCOUNTER — Encounter: Payer: Medicare HMO | Admitting: Vascular Surgery

## 2021-04-04 DIAGNOSIS — M5440 Lumbago with sciatica, unspecified side: Secondary | ICD-10-CM | POA: Diagnosis not present

## 2021-04-04 DIAGNOSIS — G2401 Drug induced subacute dyskinesia: Secondary | ICD-10-CM

## 2021-04-04 DIAGNOSIS — F319 Bipolar disorder, unspecified: Secondary | ICD-10-CM | POA: Diagnosis not present

## 2021-04-04 DIAGNOSIS — G8929 Other chronic pain: Secondary | ICD-10-CM

## 2021-04-04 DIAGNOSIS — I5032 Chronic diastolic (congestive) heart failure: Secondary | ICD-10-CM

## 2021-04-04 DIAGNOSIS — I160 Hypertensive urgency: Secondary | ICD-10-CM

## 2021-04-04 DIAGNOSIS — Z8659 Personal history of other mental and behavioral disorders: Secondary | ICD-10-CM

## 2021-04-04 DIAGNOSIS — E875 Hyperkalemia: Principal | ICD-10-CM

## 2021-04-04 LAB — CBC WITH DIFFERENTIAL/PLATELET
Abs Immature Granulocytes: 0.02 10*3/uL (ref 0.00–0.07)
Basophils Absolute: 0 10*3/uL (ref 0.0–0.1)
Basophils Relative: 0 %
Eosinophils Absolute: 0 10*3/uL (ref 0.0–0.5)
Eosinophils Relative: 1 %
HCT: 32.1 % — ABNORMAL LOW (ref 36.0–46.0)
Hemoglobin: 10.1 g/dL — ABNORMAL LOW (ref 12.0–15.0)
Immature Granulocytes: 0 %
Lymphocytes Relative: 19 %
Lymphs Abs: 0.9 10*3/uL (ref 0.7–4.0)
MCH: 28.3 pg (ref 26.0–34.0)
MCHC: 31.5 g/dL (ref 30.0–36.0)
MCV: 89.9 fL (ref 80.0–100.0)
Monocytes Absolute: 0.3 10*3/uL (ref 0.1–1.0)
Monocytes Relative: 5 %
Neutro Abs: 3.8 10*3/uL (ref 1.7–7.7)
Neutrophils Relative %: 75 %
Platelets: 255 10*3/uL (ref 150–400)
RBC: 3.57 MIL/uL — ABNORMAL LOW (ref 3.87–5.11)
RDW: 16.8 % — ABNORMAL HIGH (ref 11.5–15.5)
WBC: 5 10*3/uL (ref 4.0–10.5)
nRBC: 0 % (ref 0.0–0.2)

## 2021-04-04 LAB — BASIC METABOLIC PANEL
Anion gap: 5 (ref 5–15)
BUN: 38 mg/dL — ABNORMAL HIGH (ref 8–23)
CO2: 21 mmol/L — ABNORMAL LOW (ref 22–32)
Calcium: 8.7 mg/dL — ABNORMAL LOW (ref 8.9–10.3)
Chloride: 110 mmol/L (ref 98–111)
Creatinine, Ser: 3.6 mg/dL — ABNORMAL HIGH (ref 0.44–1.00)
GFR, Estimated: 13 mL/min — ABNORMAL LOW (ref >60)
Glucose, Bld: 94 mg/dL (ref 70–99)
Potassium: 6 mmol/L — ABNORMAL HIGH (ref 3.5–5.1)
Sodium: 136 mmol/L (ref 135–145)

## 2021-04-04 LAB — COMPREHENSIVE METABOLIC PANEL
ALT: 12 U/L (ref 0–44)
AST: 16 U/L (ref 15–41)
Albumin: 3.8 g/dL (ref 3.5–5.0)
Alkaline Phosphatase: 99 U/L (ref 38–126)
Anion gap: 8 (ref 5–15)
BUN: 36 mg/dL — ABNORMAL HIGH (ref 8–23)
CO2: 19 mmol/L — ABNORMAL LOW (ref 22–32)
Calcium: 9 mg/dL (ref 8.9–10.3)
Chloride: 109 mmol/L (ref 98–111)
Creatinine, Ser: 3.33 mg/dL — ABNORMAL HIGH (ref 0.44–1.00)
GFR, Estimated: 15 mL/min — ABNORMAL LOW (ref >60)
Glucose, Bld: 112 mg/dL — ABNORMAL HIGH (ref 70–99)
Potassium: 5.8 mmol/L — ABNORMAL HIGH (ref 3.5–5.1)
Sodium: 136 mmol/L (ref 135–145)
Total Bilirubin: 0.4 mg/dL (ref 0.3–1.2)
Total Protein: 7.2 g/dL (ref 6.5–8.1)

## 2021-04-04 MED ORDER — LABETALOL HCL 5 MG/ML IV SOLN
10.0000 mg | Freq: Once | INTRAVENOUS | Status: AC
  Administered 2021-04-04: 10 mg via INTRAVENOUS
  Filled 2021-04-04: qty 4

## 2021-04-04 MED ORDER — VITAMIN D 25 MCG (1000 UNIT) PO TABS
2000.0000 [IU] | ORAL_TABLET | Freq: Every day | ORAL | Status: DC
  Administered 2021-04-04 – 2021-04-07 (×4): 2000 [IU] via ORAL
  Filled 2021-04-04 (×4): qty 2

## 2021-04-04 MED ORDER — GABAPENTIN 300 MG PO CAPS
600.0000 mg | ORAL_CAPSULE | Freq: Three times a day (TID) | ORAL | Status: DC
  Administered 2021-04-04 (×3): 600 mg via ORAL
  Filled 2021-04-04 (×3): qty 2

## 2021-04-04 MED ORDER — OXYCODONE-ACETAMINOPHEN 5-325 MG PO TABS
2.0000 | ORAL_TABLET | Freq: Two times a day (BID) | ORAL | Status: DC
  Administered 2021-04-04 – 2021-04-06 (×6): 2 via ORAL
  Filled 2021-04-04 (×6): qty 2

## 2021-04-04 MED ORDER — FLUTICASONE PROPIONATE 50 MCG/ACT NA SUSP
2.0000 | Freq: Every day | NASAL | Status: DC
  Administered 2021-04-04 – 2021-04-07 (×4): 2 via NASAL
  Filled 2021-04-04: qty 16

## 2021-04-04 MED ORDER — FERROUS SULFATE 325 (65 FE) MG PO TABS
325.0000 mg | ORAL_TABLET | Freq: Every day | ORAL | Status: DC
  Administered 2021-04-04 – 2021-04-07 (×4): 325 mg via ORAL
  Filled 2021-04-04 (×4): qty 1

## 2021-04-04 MED ORDER — SODIUM ZIRCONIUM CYCLOSILICATE 5 G PO PACK
5.0000 g | PACK | Freq: Once | ORAL | Status: AC
  Administered 2021-04-04: 5 g via ORAL
  Filled 2021-04-04: qty 1

## 2021-04-04 MED ORDER — GABAPENTIN 300 MG PO CAPS: 600.0000 mg | ORAL_CAPSULE | Freq: Three times a day (TID) | ORAL | Status: DC

## 2021-04-04 MED ORDER — LABETALOL HCL 300 MG PO TABS
300.0000 mg | ORAL_TABLET | Freq: Two times a day (BID) | ORAL | Status: DC
  Administered 2021-04-04 – 2021-04-07 (×8): 300 mg via ORAL
  Filled 2021-04-04 (×8): qty 1

## 2021-04-04 MED ORDER — LABETALOL HCL 300 MG PO TABS
300.0000 mg | ORAL_TABLET | Freq: Two times a day (BID) | ORAL | Status: DC
  Filled 2021-04-04: qty 1

## 2021-04-04 MED ORDER — AMLODIPINE BESYLATE 10 MG PO TABS
10.0000 mg | ORAL_TABLET | Freq: Every day | ORAL | Status: DC
  Administered 2021-04-04 – 2021-04-07 (×4): 10 mg via ORAL
  Filled 2021-04-04 (×4): qty 1

## 2021-04-04 MED ORDER — OXYCODONE-ACETAMINOPHEN 5-325 MG PO TABS
1.0000 | ORAL_TABLET | Freq: Four times a day (QID) | ORAL | Status: DC | PRN
  Administered 2021-04-04 – 2021-04-06 (×5): 1 via ORAL
  Filled 2021-04-04 (×6): qty 1

## 2021-04-04 MED ORDER — ISOSORBIDE MONONITRATE ER 60 MG PO TB24
60.0000 mg | ORAL_TABLET | Freq: Every day | ORAL | Status: DC
  Administered 2021-04-04 – 2021-04-07 (×4): 60 mg via ORAL
  Filled 2021-04-04 (×4): qty 1

## 2021-04-04 MED ORDER — HYDRALAZINE HCL 50 MG PO TABS
100.0000 mg | ORAL_TABLET | Freq: Three times a day (TID) | ORAL | Status: DC
  Administered 2021-04-04 – 2021-04-07 (×10): 100 mg via ORAL
  Filled 2021-04-04 (×10): qty 2

## 2021-04-04 MED ORDER — OXYMETAZOLINE HCL 0.05 % NA SOLN
1.0000 | Freq: Two times a day (BID) | NASAL | Status: DC | PRN
  Administered 2021-04-04: 1 via NASAL
  Filled 2021-04-04: qty 15

## 2021-04-04 MED ORDER — VALBENAZINE TOSYLATE 40 MG PO CAPS
80.0000 mg | ORAL_CAPSULE | Freq: Every day | ORAL | Status: DC
  Administered 2021-04-04 – 2021-04-06 (×4): 80 mg via ORAL
  Filled 2021-04-04 (×4): qty 2

## 2021-04-04 MED ORDER — SODIUM BICARBONATE 650 MG PO TABS
650.0000 mg | ORAL_TABLET | Freq: Two times a day (BID) | ORAL | Status: DC
  Administered 2021-04-04 – 2021-04-07 (×8): 650 mg via ORAL
  Filled 2021-04-04 (×8): qty 1

## 2021-04-04 MED ORDER — LEVALBUTEROL TARTRATE 45 MCG/ACT IN AERO
2.0000 | INHALATION_SPRAY | Freq: Three times a day (TID) | RESPIRATORY_TRACT | Status: DC | PRN
  Administered 2021-04-04 – 2021-04-05 (×2): 2 via RESPIRATORY_TRACT
  Filled 2021-04-04: qty 15

## 2021-04-04 MED ORDER — ACETAMINOPHEN 325 MG PO TABS: 650.0000 mg | ORAL_TABLET | Freq: Four times a day (QID) | ORAL | Status: DC

## 2021-04-04 MED ORDER — HYDRALAZINE HCL 100 MG PO TABS: 100.0000 mg | ORAL_TABLET | Freq: Three times a day (TID) | ORAL | Status: DC

## 2021-04-04 MED ORDER — SODIUM CHLORIDE 0.9 % IV SOLN: INTRAVENOUS | Status: DC

## 2021-04-04 MED ORDER — SODIUM ZIRCONIUM CYCLOSILICATE 10 G PO PACK
10.0000 g | PACK | Freq: Every day | ORAL | Status: DC
  Administered 2021-04-04 – 2021-04-07 (×4): 10 g via ORAL
  Filled 2021-04-04 (×4): qty 1

## 2021-04-04 MED ORDER — ONDANSETRON HCL 4 MG/2ML IJ SOLN
4.0000 mg | Freq: Three times a day (TID) | INTRAMUSCULAR | Status: DC | PRN
  Administered 2021-04-04 – 2021-04-06 (×7): 4 mg via INTRAVENOUS
  Filled 2021-04-04 (×7): qty 2

## 2021-04-04 MED ORDER — FLUOXETINE HCL 20 MG PO CAPS
40.0000 mg | ORAL_CAPSULE | Freq: Every day | ORAL | Status: DC
  Administered 2021-04-04 – 2021-04-07 (×4): 40 mg via ORAL
  Filled 2021-04-04 (×4): qty 2

## 2021-04-04 MED ORDER — PANTOPRAZOLE SODIUM 20 MG PO TBEC
20.0000 mg | DELAYED_RELEASE_TABLET | Freq: Two times a day (BID) | ORAL | Status: DC
  Administered 2021-04-04 – 2021-04-07 (×8): 20 mg via ORAL
  Filled 2021-04-04 (×8): qty 1

## 2021-04-04 MED ORDER — GABAPENTIN 400 MG PO CAPS
800.0000 mg | ORAL_CAPSULE | Freq: Three times a day (TID) | ORAL | Status: DC
  Administered 2021-04-05 – 2021-04-07 (×7): 800 mg via ORAL
  Filled 2021-04-04 (×7): qty 2

## 2021-04-04 MED ORDER — LEVALBUTEROL HCL 0.63 MG/3ML IN NEBU: 0.6300 mg | INHALATION_SOLUTION | Freq: Three times a day (TID) | RESPIRATORY_TRACT | Status: DC | PRN

## 2021-04-04 NOTE — Progress Notes (Signed)
PROGRESS NOTE  Sherry [name]  DOB: [birth year]  PCP: Wynelle Fanny, DO BPZ:[id number]  DOA: 04/03/2021  LOS: 0 days  Hospital Day: 2   Chief Complaint  Patient presents with   Covid Positive    Brief narrative: Sherry [name] is a 65 y.o. female with PMH significant for HTN, chronic diastolic CHF, CKD 4, chronic alcohol use, history of breast cancer, history of hepatitis C, bipolar disorder, tardive dyskinesia, restless leg syndrome who lives at home alone, gets around with a walker and is on Percocet for chronic pain management. Patient presented to the ED on 9/22 with complaint of nausea and shortness of breath with rest and exertion. Positive COVID test at home on 9/22  In the ED, patient was afebrile, initial blood pressure was 148/64 but while in the ED blood pressure got worse as high as 232/104. Labs with potassium elevated to 6.7, creatinine elevated to 3.54, WBC low at 3.7, hemoglobin at 10.4. EKG showed peaked T waves. COVID PCR positive.  Chest x-ray unremarkable. Hyperkalemia was treated with IV calcium gluconate, 10 units of insulin with D50.  Patient was admitted to hospitalist service for further evaluation and management. See below for details.   Subjective: Patient was seen and examined this morning.  Lying down in bed.  Not in distress.  complains of chest wall soreness everywhere. Not in respiratory distress.  Not requiring supplemental oxygen.   Chart reviewed Last blood pressure from this morning 198/81 Last set of labs from this morning with potassium down to 5.8, creatinine slightly down to 3.33  Assessment/Plan: Acute on chronic hyperkalemia  -With peak T waves seen on EKG -Patient states that she has been told in the past also that her potassium tends to run high.  She has been instructed to reduce eating potato chips and other potassium containing food.  -Acute elevation in potassium level could be likely due to hypovolemia and AKI. -Received IV calcium  gluconate, 10 units of insulin at, dextrose in ED with improvement from initial value of 6.7 to 5.3.  Trending up again, repeat potassium this morning is 5.8.  1 dose of Lokelma was given.  We will repeat potassium level again at 2 PM   COVID viral 19 infection -No respiratory symptoms.  Body wall pain probably related to COVID itself.  Chronically on Percocet.  Ordered for low-dose.  Continue to monitor. -Continue contact and airborne isolation.   Hypertensive urgency Gastric CHF -While in the ED, systolic blood pressure was elevated up to 232.   -Continued on home meds which include amlodipine, hydralazine, labetalol, Imdur -give PRN IV Labetalol once -Last echocardiogram in 05/2020 with EF of 60-65%   AKI on CKD stage IV -Presented with creatinine of 3.54 which is elevated compared to her baseline of 3.2 from June.  Creatinine slightly improved with IV hydration this morning.   -Follows with [facility]. Recent Labs    07/25/20 1104 08/15/20 0940 08/21/20 0812 08/21/20 0833 10/17/20 1428 10/21/20 0937 10/22/20 0730 12/19/20 1759 04/03/21 1609 04/04/21 0505  BUN 41* 35* 46* 38* 41* 39* 39* 41* 33* 36*  CREATININE 3.04* 2.74* 3.70* 3.53* 3.27* 3.58* 3.60* 3.20* 3.54* 3.33*   Tardive dyskinesia -Continue Ingrezza   Bipolar disorder -Continue Prozac   Chronic pain -Continue Percocet   Obesity -Body mass index is 39.14 kg/m. Patient has been advised to make an attempt to improve diet and exercise patterns to aid in weight loss.   Mobility: Encourage ambulation.  PT eval ordered Code Status:  Code Status: Full Code  Nutritional status: Body mass index is 39.14 kg/m.     Diet:  Diet Order             Diet Heart Room service appropriate? Yes; Fluid consistency: Thin  Diet effective now                  DVT prophylaxis:  heparin injection 5,000 Units Start: 04/03/21 2345   Antimicrobials: None Fluid: NS at 37 Consultants: None Family Communication:  None at bedside  Status is: Observation  Remains inpatient appropriate because: Needs IV hydration, renal function monitoring  Dispo: The patient is from: Home              Anticipated d/c is to: Home may need home health.  PT eval pending              Patient currently is not medically stable to d/c.   Difficult to place patient No     Infusions:   sodium chloride      Scheduled Meds:  amLODipine  10 mg Oral Daily   cholecalciferol  2,000 Units Oral Daily   ferrous sulfate  325 mg Oral Q breakfast   FLUoxetine  40 mg Oral Daily   gabapentin  600 mg Oral TID   heparin  5,000 Units Subcutaneous Q8H   hydrALAZINE  100 mg Oral TID   isosorbide mononitrate  60 mg Oral Daily   labetalol  300 mg Oral BID   oxyCODONE-acetaminophen  2 tablet Oral BID   pantoprazole  20 mg Oral BID   sodium bicarbonate  650 mg Oral BID   sodium zirconium cyclosilicate  10 g Oral Daily   valbenazine  80 mg Oral QHS    Antimicrobials: Anti-infectives (From admission, onward)    None       PRN meds: ondansetron (ZOFRAN) IV, oxyCODONE-acetaminophen   Objective: Vitals:   04/04/21 0902 04/04/21 1030  BP: (!) 187/83 (!) 134/58  Pulse: 73 67  Resp: (!) 22 18  Temp: 99.2 F (37.3 C) 99.8 F (37.7 C)  SpO2:      Intake/Output Summary (Last 24 hours) at 04/04/2021 1126 Last data filed at 04/04/2021 0236 Gross per 24 hour  Intake 524.37 ml  Output --  Net 524.37 ml   Filed Weights   04/03/21 1341  Weight: 97.1 kg   Weight change:  Body mass index is 39.14 kg/m.   Physical Exam: General exam: Pleasant, elderly African-American female.  Not in pain at rest Skin: No rashes, lesions or ulcers. HEENT: Atraumatic, normocephalic, no obvious bleeding Lungs: Clear to auscultation bilaterally Chest wall: Mild diffuse tenderness on palpation of chest wall anteriorly CVS: Regular rate and rhythm, no murmur GI/Abd soft, nontender, nondistended, also present CNS: Alert, awake, oriented  x3 Psychiatry: Mood appropriate Extremities: No pedal edema, no calf tenderness  Data Review: I have personally reviewed the laboratory data and studies available.  Recent Labs  Lab 04/03/21 1609 04/04/21 0505  WBC 3.7* 5.0  NEUTROABS 1.9 3.8  HGB 10.4* 10.1*  HCT 32.4* 32.1*  MCV 89.0 89.9  PLT 262 255   Recent Labs  Lab 04/03/21 1609 04/03/21 2258 04/04/21 0505  NA 135  --  136  K 6.7* 5.3* 5.8*  CL 109  --  109  CO2 19*  --  19*  GLUCOSE 88  --  112*  BUN 33*  --  36*  CREATININE 3.54*  --  3.33*  CALCIUM 9.1  --  9.0    F/u labs ordered Unresulted Labs (From admission, onward)     Start     Ordered   04/04/21 1959  Basic metabolic panel  Once-Timed,   TIMED        04/04/21 1122   04/04/21 0500  Comprehensive metabolic panel  Daily,   R      04/03/21 2245   04/04/21 0500  CBC with Differential/Platelet  Daily,   R      04/03/21 2245            Signed, Terrilee Croak, MD [facility] 04/04/2021

## 2021-04-04 NOTE — H&P (Signed)
History and Physical    Sherry [name] YIR:[id number] DOB: 1956-04-05 DOA: 04/03/2021  PCP: Wynelle Fanny, DO  Patient coming from: [place] have personally briefly reviewed patient's old medical records in [place]  Chief Complaint: Nausea  HPI: Sherry [name] is a 65 y.o. female with medical history significant for history of breast cancer, chronic diastolic heart failure, hypertension, history of hepatitis C, CKD stage IV, bipolar disorder, tardive dyskinesia, restless leg syndrome, alcohol abuse who presents with concerns of nausea and shortness of breath.  States she has felt sick for about 3 days with nausea but denies any vomiting, diarrhea abdominal pain.  Has felt short of breath both at rest and with exertion.  Denies any cough, runny nose or congestion.  Had positive COVID home test yesterday.  Denies any sick contact.  States she has received 2 doses of COVID vaccine with booster.  Reports compliant with all of her medications this morning prior to admission.  ED Course: She was afebrile, intermittently hypertensive up to systolic of 500.  WBC of 3.7, hemoglobin of 10.4 which is near baseline.  Sodium 135, potassium of 6.7 with EKG showing peaked T waves but no other significant abnormalities. Creatinine of 3.54 which is around her baseline.  Hyperkalemia was treated with IV calcium gluconate, 10 units of insulin with D50.  Hospitalist was then called for transfer to [facility] for symptomatic COVID with electrolyte abnormality.  Review of Systems:  Constitutional: No Weight Change, No Fever ENT/Mouth: No sore throat, No Rhinorrhea Eyes: No Eye Pain, No Vision Changes Cardiovascular: No Chest Pain, no SOB, No PND, No Dyspnea on Exertion, No Orthopnea, No Claudication, No Edema, No Palpitations Respiratory: No Cough, No Sputum, No Wheezing, no Dyspnea  Gastrointestinal: No Nausea, No Vomiting, No Diarrhea, No Constipation, No Pain Genitourinary: no Urinary  Incontinence, No Urgency, No Flank Pain Musculoskeletal: No Arthralgias, No Myalgias Skin: No Skin Lesions, No Pruritus, Neuro: no Weakness, No Numbness,  No Loss of Consciousness, No Syncope Psych: No Anxiety/Panic, No Depression, no decrease appetite Heme/Lymph: No Bruising, No Bleeding  Past Medical History:  Diagnosis Date   Alcohol abuse 05/09/2019   Alcohol use disorder, severe, dependence ([place]) 08/27/2017   Alcohol withdrawal ([place]) 08/09/2017   Anxiety    ARF (acute renal failure) ([place]) 04/09/2020   Arthropathy of lumbar facet joint 05/24/2020   Bilateral low back pain with bilateral sciatica 01/05/2019   Bipolar and related disorder ([place])    Blood transfusion without reported diagnosis    Body mass index (BMI) 40.0-44.9, adult ([place]) 07/19/2020   Breast cancer ([place])    right lumpectemy and lymph node    Cancer ([place]) 09/06/2017   Cervical spondylosis 05/24/2020   Chest pain 05/31/2020   Chest pain, rule out acute myocardial infarction 05/29/2020   CHF (congestive heart failure) (HCC)    Chronic back pain    Chronic bilateral low back pain with right-sided sciatica 05/24/2020   Chronic diastolic CHF (congestive heart failure) ([place]) 05/09/2019   Chronic kidney disease    "Stage IV" - states due to hypertension   Chronic kidney disease, stage III (moderate) (HCC) 11/17/2016   Chronic low back pain 09/06/2017   Disc levels:      No abnormality at L2-3 or above.      L3-4:  Mild bulging of the disc.  No stenosis.      L4-5: Bilateral facet arthropathy with gaping, fluid-filled joints.   Joint edema. Anterolisthesis would likely occur at  this level with   standing and flexion. Disc degeneration with mild bulging of the   disc. Mild narrowing of the lateral recesses and foramina, which   would likely worsen   Cigarette smoker 12/31/2017   CKD (chronic kidney disease) stage 4, GFR 15-29 ml/min (HCC) 04/18/2020   Clonus 05/24/2020   Cyst of ovary    Depression    Depression with anxiety  08/21/2016   Dizziness 02/24/2018   DOE (dyspnea on exertion) 12/30/2017   12/30/2017   Walked RA x one lap @ 185 stopped due to  Sob/ lightheaded weak with sats 96% at end - Spirometry 12/30/2017  FEV1 1.27 (68%)  Ratio 99 s curvature / abn effort dep portion only     Dyspnea    Dyspnea and respiratory abnormality 02/13/2013   Formatting of this note might be different from the original. STORY: PSG on 05/20/12 showed no OSA but upper airway resistance syndrome, AHI 3.9, severe snoring was noted. ESS=8. UARS. Advise positional therapy, weight loss program, regular exercise.   Essential hypertension 07/06/2017   GERD (gastroesophageal reflux disease)    Grief reaction with prolonged bereavement 09/06/2017   Partner of 18 yrs left 2018-pt admits she still loves her   Hepatitis C    recieved treatment for Hep C   Hepatitis C virus infection cured after antiviral drug therapy 12/17/2012   Telephone Encounter - Dub Mikes, RN - 12/28/2016 9:54 AM EDT Patient called for HCV RNA test results. EOT 08-26-16 - not detected. 12 week post treatment 12-11-16 - not detected. Informed patient that she was cured. Patient verbalized understanding.          History of breast cancer 02/13/2013   Hx of bipolar disorder 01/31/2016   Hypertension    Hypertensive emergency 10/21/2017   Impaired functional mobility, balance, gait, and endurance 01/09/2021   Left-sided weakness    MDD (major depressive disorder), recurrent severe, without psychosis ([place])    Medication intolerance 09/06/2017   Remeron-appetite stimulant/Dizziness/Dysphoria   Microcytic anemia 10/21/2017   Morbid obesity due to excess calories ([place]) 02/13/2013   BMI 42.9   MRSA infection    of breast incision   Nausea 04/09/2020   Formatting of this note might be different from the original. Added automatically from request for surgery [id number]   Near syncope 05/29/2020   Neck pain 05/24/2020   Neuroleptic-induced tardive dyskinesia 09/06/2017   Abilify   Opioid  dependence ([place])    Has been treated in [facility] in past   Opioid use disorder, severe, dependence ([place]) 08/27/2017   Oral aphthous ulcer 06/25/2015   Last Assessment & Plan:  Formatting of this note might be different from the original. - Appear to be resolving as a be expected in the case of aphthous stomatitis. - Continue topical therapy with viscous lidocaine.  Recommended trying high-dose anti-inflammatory such as 600 mg of ibuprofen as needed. - She'll return to ENT clinic if symptoms do not resolve.   Phlebitis after infusion 10/28/2017   Rt antecubital fossa   Pre-operative cardiovascular examination 06/13/2018   Restless legs syndrome 02/13/2013   Formatting of this note might be different from the original. IMPRESSION: Possible. Will follow.   Spondylolisthesis, lumbar region 10/21/2020   Subacute dyskinesia due to drug 02/13/2013   Formatting of this note might be different from the original. STORY: Tardive dyskinesia and mild limb dyskinesia, LE>UE which likely due to prolonged antipsychotic used, abilify. Couldn't tolerate artane, gabapentin, clonazepam and xenazine.`E1o3L`IMPRESSION: Well tolerated depakote 250mg   bid, mood aspect is better as well. Will increase to 500mg  bid. ADR was discussed.  RTC 4-6 weeks.   Substance abuse ([place])    Substance induced mood disorder ([place]) 08/13/2017   TIA (transient ischemic attack) 2020   P/w BP >230/120 and neurologic symptoms, presumed TIA    Past Surgical History:  Procedure Laterality Date   BACK SURGERY  1990   BREAST SURGERY Right 2010   "breast cancer survivor" - states partial mastectomy and nodes   COLONOSCOPY     [facility]   ESOPHAGOGASTRODUODENOSCOPY     [facility]   ESOPHAGOGASTRODUODENOSCOPY  04/18/2020   [facility]   LAPAROSCOPIC CHOLECYSTECTOMY  2002   LAPAROSCOPIC INCISIONAL / UMBILICAL / [place]  04/13/2018   with BARD 15x 48JE mesh (supraumbilical)   LAPAROSCOPIC LYSIS OF ADHESIONS   07/12/2018   Procedure: LAPAROSCOPIC LYSIS OF ADHESIONS;  Surgeon: Isabel Caprice, MD;  Location: [facility];  Service: Gynecology;;   MULTIPLE TOOTH EXTRACTIONS     MYOMECTOMY     x 2 prior to hysterectomy   ROBOTIC ASSISTED BILATERAL SALPINGO OOPHERECTOMY Right 07/12/2018   Procedure: XI ROBOTIC ASSISTED RIGHT SALPINGO OOPHORECTOMY;  Surgeon: Isabel Caprice, MD;  Location: [facility];  Service: Gynecology;  Laterality: Right;   TOTAL ABDOMINAL HYSTERECTOMY     fibroids    UPPER GASTROINTESTINAL ENDOSCOPY     WISDOM TOOTH EXTRACTION       reports that she has been smoking cigarettes. She has been smoking an average of 1 pack per day. She has never used smokeless tobacco. She reports that she does not currently use alcohol. She reports that she does not currently use drugs. Social History  Allergies  Allergen Reactions   Abilify [Aripiprazole] Other (See Comments)    Tardive dyskinesia Oral   Remeron [Mirtazapine] Other (See Comments)    Wgt stimulation /gain, Dizziness, Patient says "can tolerate"   Trazodone And Nefazodone Other (See Comments)    Nightmares/sleep diturbance   Clavulanic Acid    Flexeril [Cyclobenzaprine] Other (See Comments)    Pt states Flexeril makes her feel depressed    Amoxicillin Diarrhea and Other (See Comments)    NOTE the patient has had PCN WITHOUT reaction Has patient had a PCN reaction causing immediate rash, facial/tongue/throat swelling, SOB or lightheadedness with hypotension: No Has patient had a PCN reaction causing severe rash involving mucus membranes or skin necrosis: No Has patient had a PCN reaction that required hospitalization: No Has patient had a PCN reaction occurring within the last 10 years: No If all of the above answers are "NO", then may proceed with Cephalosporin use.     Family History  Problem Relation Age of Onset   Heart attack Mother    Breast cancer Mother 63   Dementia Mother    Cancer Father 7       died of bleeding  from kidneys   Colon cancer Neg Hx    Esophageal cancer Neg Hx    Stomach cancer Neg Hx    Rectal cancer Neg Hx      Prior to Admission medications   Medication Sig Start Date End Date Taking? Authorizing Provider  amLODipine (NORVASC) 10 MG tablet Take 10 mg by mouth daily.    [provider]  Cholecalciferol (VITAMIN D3) 50 MCG (2000 UT) capsule Take 1 capsule (2,000 Units total) by mouth daily. Patient taking differently: Take 2,000 Units by mouth in the morning. 09/26/20   Wendie Agreste,  MD  diphenhydramine-acetaminophen (TYLENOL PM) 25-500 MG TABS tablet Take 1 tablet by mouth at bedtime.    [provider]  docusate sodium (COLACE) 100 MG capsule Take 1 capsule (100 mg total) by mouth 2 (two) times daily. 10/23/20   Viona Gilmore D, NP  ferrous sulfate 325 (65 FE) MG tablet Take 1 tablet by mouth daily with breakfast.    [provider]  FLUoxetine (PROZAC) 40 MG capsule Take 40 mg by mouth in the morning. 11/09/18   [provider]  furosemide (LASIX) 20 MG tablet Take 20 mg by mouth daily. 07/31/20   [provider]  gabapentin (NEURONTIN) 400 MG capsule Take 1 capsule (400 mg total) by mouth 2 (two) times daily for 15 days. 03/19/21 04/03/21  Mesner, Corene Cornea, MD  hydrALAZINE (APRESOLINE) 100 MG tablet Take 100 mg by mouth 3 (three) times daily. 09/26/20   [provider]  hyoscyamine (LEVSIN) 0.125 MG tablet TAKE 1 TABLET(0.125 MG) BY MOUTH EVERY 6 HOURS AS NEEDED FOR CRAMPING OR ABDOMINAL PAIN 01/18/21   Wendie Agreste, MD  INGREZZA 80 MG CAPS Take 80 mg by mouth at bedtime. 10/15/17   [provider]  isosorbide mononitrate (IMDUR) 60 MG 24 hr tablet Take 60 mg by mouth daily.    [provider]  labetalol (NORMODYNE) 300 MG tablet Take 300 mg by mouth 2 (two) times daily.    [provider]  lidocaine (LIDODERM) 5 % Place 1 patch onto the skin daily. Remove & Discard patch within 12 hours or as directed by  MD 10/10/20   Nuala Alpha A, PA-C  melatonin 5 MG TABS Take 5 mg by mouth at bedtime.    [provider]  methocarbamol (ROBAXIN) 500 MG tablet Take 1 tablet (500 mg total) by mouth 2 (two) times daily. 02/17/21   Jacqlyn Larsen, PA-C  methylPREDNISolone (MEDROL DOSEPAK) 4 MG TBPK tablet Take as directed by mouth, see back of pack for instructions 02/17/21   Jacqlyn Larsen, PA-C  ondansetron (ZOFRAN) 4 MG tablet Take 4 mg by mouth every 8 (eight) hours as needed for nausea/vomiting.    [provider]  pantoprazole (PROTONIX) 20 MG tablet TAKE 1 TABLET(20 MG) BY MOUTH TWICE DAILY 02/07/21   Wendie Agreste, MD  sodium bicarbonate 650 MG tablet Take 650 mg by mouth 2 (two) times daily. 09/04/20   [provider]    Physical Exam: Vitals:   04/03/21 1715 04/03/21 1853 04/03/21 2229 04/04/21 0044  BP: (!) 134/107 (!) 209/75 (!) 210/96 (!) 232/104  Pulse: (!) 58 66 76 72  Resp: 17 18 18 18   Temp:   97.8 F (36.6 C) 97.8 F (36.6 C)  TempSrc:   Oral   SpO2: 99% 100% 100% 95%  Weight:      Height:        Constitutional: NAD, calm, comfortable, fatigued appearing morbidly obese female laying flat in bed initially asleep but awoke easily to voice Vitals:   04/03/21 1715 04/03/21 1853 04/03/21 2229 04/04/21 0044  BP: (!) 134/107 (!) 209/75 (!) 210/96 (!) 232/104  Pulse: (!) 58 66 76 72  Resp: 17 18 18 18   Temp:   97.8 F (36.6 C) 97.8 F (36.6 C)  TempSrc:   Oral   SpO2: 99% 100% 100% 95%  Weight:      Height:       Eyes: PERRL, lids and conjunctivae normal ENMT: Mucous membranes are moist.  Neck: normal, supple Respiratory: clear  to auscultation bilaterally, no wheezing, no crackles. Normal respiratory effort. No accessory muscle use.  Abdomen: no tenderness, no masses palpated.  Bowel sounds positive.  Musculoskeletal: no clubbing / cyanosis. No joint deformity upper and lower extremities. Good ROM, no contractures. Normal muscle tone.  Skin: no rashes,  lesions, ulcers. No induration Neurologic: CN 2-12 grossly intact. Sensation intact, Strength 5/5 in all 4.  Involuntary repetitive chewing motion of the mouth secondary to tardive dyskinesia Psychiatric: Normal judgment and insight. Alert and oriented x 3. Normal mood.     Labs on Admission: I have personally reviewed following labs and imaging studies  CBC: Recent Labs  Lab 04/03/21 1609  WBC 3.7*  NEUTROABS 1.9  HGB 10.4*  HCT 32.4*  MCV 89.0  PLT 937   Basic Metabolic Panel: Recent Labs  Lab 04/03/21 1609 04/03/21 2258  NA 135  --   K 6.7* 5.3*  CL 109  --   CO2 19*  --   GLUCOSE 88  --   BUN 33*  --   CREATININE 3.54*  --   CALCIUM 9.1  --    GFR: Estimated Creatinine Clearance: 17.2 mL/min (A) (by C-G formula based on SCr of 3.54 mg/dL (H)). Liver Function Tests: No results for input(s): AST, ALT, ALKPHOS, BILITOT, PROT, ALBUMIN in the last 168 hours. No results for input(s): LIPASE, AMYLASE in the last 168 hours. No results for input(s): AMMONIA in the last 168 hours. Coagulation Profile: No results for input(s): INR, PROTIME in the last 168 hours. Cardiac Enzymes: No results for input(s): CKTOTAL, CKMB, CKMBINDEX, TROPONINI in the last 168 hours. BNP (last 3 results) No results for input(s): PROBNP in the last 8760 hours. HbA1C: No results for input(s): HGBA1C in the last 72 hours. CBG: Recent Labs  Lab 04/03/21 1837 04/03/21 2017 04/03/21 2058 04/03/21 2137  GLUCAP 231* 41* 127* 121*   Lipid Profile: No results for input(s): CHOL, HDL, LDLCALC, TRIG, CHOLHDL, LDLDIRECT in the last 72 hours. Thyroid Function Tests: No results for input(s): TSH, T4TOTAL, FREET4, T3FREE, THYROIDAB in the last 72 hours. Anemia Panel: No results for input(s): VITAMINB12, FOLATE, FERRITIN, TIBC, IRON, RETICCTPCT in the last 72 hours. Urine analysis:    Component Value Date/Time   COLORURINE YELLOW 07/10/2020 0011   APPEARANCEUR CLEAR 07/10/2020 0011   LABSPEC  1.020 07/10/2020 0011   PHURINE 5.0 07/10/2020 0011   GLUCOSEU NEGATIVE 07/10/2020 0011   HGBUR NEGATIVE 07/10/2020 0011   BILIRUBINUR NEGATIVE 07/10/2020 0011   KETONESUR NEGATIVE 07/10/2020 0011   PROTEINUR >300 (A) 07/10/2020 0011   UROBILINOGEN 0.2 01/12/2010 1941   NITRITE NEGATIVE 07/10/2020 0011   LEUKOCYTESUR NEGATIVE 07/10/2020 0011    Radiological Exams on Admission: DG Chest Portable 1 View  Result Date: 04/03/2021 CLINICAL DATA:  covid + EXAM: PORTABLE CHEST 1 VIEW COMPARISON:  None. FINDINGS: Cardiomegaly. No pleural effusion. No pneumothorax. No mass or consolidation. Partially visualized cervical fusion hardware. IMPRESSION: Cardiomegaly.  No focal pulmonary process. Electronically Signed   By: Albin Felling M.D.   On: 04/03/2021 14:47      Assessment/Plan  Hyperkalemia  -With peak T waves seen on EKG -likely due to hypovolemia and CKD -Received IV calcium gluconate, 10 units of insulin at, dextrose in ED with improvement from 6.7-5.3.  We will also give one-time dose of Lokelma.  COVID viral 19 infection - Patient only reports symptoms of nausea and dyspnea but stable on room air - Will not initiate COVID treatment at this time - Continue contact  and airborne isolation.  Hypertensive urgency - Patient reports compliant with her antihypertensives this morning prior to admission but blood pressure has been trending to systolic of 683F.  However, blood pressure had to be taken at the wrist so could be an overestimation of her actual blood pressure -Will resume home meds-amlodipine, hydralazine, labetalol, Imdur -give PRN IV Labetalol once  Chronic diastolic heart failure - Appears hypervolemic based on lab work - Last echocardiogram in 05/2020 with EF of 6065%  CKD stage IV - Creatinine of 3.54 which is slightly elevated from before but still around her baseline - Follow in the morning after IV fluid  Tardive dyskinesia - Continue Ingrezza  Bipolar  disorder - Continue Prozac  Chronic pain - Continue Percocet.  I have personally reviewed the [place] substance record and she does have a 30-day prescription from 03/24/2021  Morbid obesity - BMI of greater than 39    DVT prophylaxis:.Lovenox Code Status: Full Family Communication: Plan discussed with patient at bedside  disposition Plan: Home with observation Consults called:  Admission status: Observation  Level of care: Telemetry  Status is: Observation  The patient remains OBS appropriate and will d/c before 2 midnights.  Dispo: The patient is from: Home              Anticipated d/c is to: Home              Patient currently is not medically stable to d/c.   Difficult to place patient No         Orene Desanctis DO [facility]   If 7PM-7AM, please contact night-coverage [email or web address]   04/04/2021, 1:20 AM

## 2021-04-05 DIAGNOSIS — Z6839 Body mass index (BMI) 39.0-39.9, adult: Secondary | ICD-10-CM | POA: Diagnosis not present

## 2021-04-05 DIAGNOSIS — Z8249 Family history of ischemic heart disease and other diseases of the circulatory system: Secondary | ICD-10-CM | POA: Diagnosis not present

## 2021-04-05 DIAGNOSIS — K219 Gastro-esophageal reflux disease without esophagitis: Secondary | ICD-10-CM | POA: Diagnosis present

## 2021-04-05 DIAGNOSIS — U071 COVID-19: Secondary | ICD-10-CM | POA: Diagnosis present

## 2021-04-05 DIAGNOSIS — Z888 Allergy status to other drugs, medicaments and biological substances status: Secondary | ICD-10-CM | POA: Diagnosis not present

## 2021-04-05 DIAGNOSIS — E861 Hypovolemia: Secondary | ICD-10-CM | POA: Diagnosis present

## 2021-04-05 DIAGNOSIS — Z803 Family history of malignant neoplasm of breast: Secondary | ICD-10-CM | POA: Diagnosis not present

## 2021-04-05 DIAGNOSIS — N184 Chronic kidney disease, stage 4 (severe): Secondary | ICD-10-CM | POA: Diagnosis present

## 2021-04-05 DIAGNOSIS — G8929 Other chronic pain: Secondary | ICD-10-CM | POA: Diagnosis present

## 2021-04-05 DIAGNOSIS — I5042 Chronic combined systolic (congestive) and diastolic (congestive) heart failure: Secondary | ICD-10-CM | POA: Diagnosis present

## 2021-04-05 DIAGNOSIS — Z880 Allergy status to penicillin: Secondary | ICD-10-CM | POA: Diagnosis not present

## 2021-04-05 DIAGNOSIS — G2581 Restless legs syndrome: Secondary | ICD-10-CM | POA: Diagnosis present

## 2021-04-05 DIAGNOSIS — Z79899 Other long term (current) drug therapy: Secondary | ICD-10-CM | POA: Diagnosis not present

## 2021-04-05 DIAGNOSIS — I130 Hypertensive heart and chronic kidney disease with heart failure and stage 1 through stage 4 chronic kidney disease, or unspecified chronic kidney disease: Secondary | ICD-10-CM | POA: Diagnosis present

## 2021-04-05 DIAGNOSIS — Z8673 Personal history of transient ischemic attack (TIA), and cerebral infarction without residual deficits: Secondary | ICD-10-CM | POA: Diagnosis not present

## 2021-04-05 DIAGNOSIS — I160 Hypertensive urgency: Secondary | ICD-10-CM | POA: Diagnosis present

## 2021-04-05 DIAGNOSIS — E875 Hyperkalemia: Secondary | ICD-10-CM | POA: Diagnosis present

## 2021-04-05 DIAGNOSIS — F17210 Nicotine dependence, cigarettes, uncomplicated: Secondary | ICD-10-CM | POA: Diagnosis present

## 2021-04-05 DIAGNOSIS — N179 Acute kidney failure, unspecified: Secondary | ICD-10-CM | POA: Diagnosis present

## 2021-04-05 DIAGNOSIS — N185 Chronic kidney disease, stage 5: Secondary | ICD-10-CM | POA: Diagnosis not present

## 2021-04-05 DIAGNOSIS — Z853 Personal history of malignant neoplasm of breast: Secondary | ICD-10-CM | POA: Diagnosis not present

## 2021-04-05 DIAGNOSIS — F319 Bipolar disorder, unspecified: Secondary | ICD-10-CM | POA: Diagnosis present

## 2021-04-05 DIAGNOSIS — M545 Low back pain, unspecified: Secondary | ICD-10-CM | POA: Diagnosis present

## 2021-04-05 DIAGNOSIS — G2401 Drug induced subacute dyskinesia: Secondary | ICD-10-CM | POA: Diagnosis present

## 2021-04-05 LAB — COMPREHENSIVE METABOLIC PANEL
ALT: 9 U/L (ref 0–44)
AST: 14 U/L — ABNORMAL LOW (ref 15–41)
Albumin: 3.3 g/dL — ABNORMAL LOW (ref 3.5–5.0)
Alkaline Phosphatase: 86 U/L (ref 38–126)
Anion gap: 8 (ref 5–15)
BUN: 36 mg/dL — ABNORMAL HIGH (ref 8–23)
CO2: 16 mmol/L — ABNORMAL LOW (ref 22–32)
Calcium: 8.7 mg/dL — ABNORMAL LOW (ref 8.9–10.3)
Chloride: 115 mmol/L — ABNORMAL HIGH (ref 98–111)
Creatinine, Ser: 3.66 mg/dL — ABNORMAL HIGH (ref 0.44–1.00)
GFR, Estimated: 13 mL/min — ABNORMAL LOW (ref >60)
Glucose, Bld: 81 mg/dL (ref 70–99)
Potassium: 5.6 mmol/L — ABNORMAL HIGH (ref 3.5–5.1)
Sodium: 139 mmol/L (ref 135–145)
Total Bilirubin: 0.4 mg/dL (ref 0.3–1.2)
Total Protein: 6.3 g/dL — ABNORMAL LOW (ref 6.5–8.1)

## 2021-04-05 LAB — CBC WITH DIFFERENTIAL/PLATELET
Abs Immature Granulocytes: 0.01 10*3/uL (ref 0.00–0.07)
Basophils Absolute: 0 10*3/uL (ref 0.0–0.1)
Basophils Relative: 1 %
Eosinophils Absolute: 0.1 10*3/uL (ref 0.0–0.5)
Eosinophils Relative: 2 %
HCT: 29.8 % — ABNORMAL LOW (ref 36.0–46.0)
Hemoglobin: 9.1 g/dL — ABNORMAL LOW (ref 12.0–15.0)
Immature Granulocytes: 0 %
Lymphocytes Relative: 41 %
Lymphs Abs: 1.7 10*3/uL (ref 0.7–4.0)
MCH: 28.3 pg (ref 26.0–34.0)
MCHC: 30.5 g/dL (ref 30.0–36.0)
MCV: 92.8 fL (ref 80.0–100.0)
Monocytes Absolute: 0.3 10*3/uL (ref 0.1–1.0)
Monocytes Relative: 8 %
Neutro Abs: 1.9 10*3/uL (ref 1.7–7.7)
Neutrophils Relative %: 48 %
Platelets: 188 10*3/uL (ref 150–400)
RBC: 3.21 MIL/uL — ABNORMAL LOW (ref 3.87–5.11)
RDW: 17 % — ABNORMAL HIGH (ref 11.5–15.5)
WBC: 4 10*3/uL (ref 4.0–10.5)
nRBC: 0 % (ref 0.0–0.2)

## 2021-04-05 MED ORDER — PROCHLORPERAZINE EDISYLATE 10 MG/2ML IJ SOLN
5.0000 mg | Freq: Once | INTRAMUSCULAR | Status: AC
  Administered 2021-04-05: 5 mg via INTRAVENOUS
  Filled 2021-04-05: qty 2

## 2021-04-05 NOTE — Progress Notes (Signed)
The patient is injury-free, afebrile, alert, and oriented X 4. Vital signs were within the baseline during this shift. Pain was controlled with current pain regiment. Chest/nasal congestion and nausea improved after interventions.  She denies chest pain, SOB, vomiting, dizziness, signs or symptoms of bleeding, or acute changes during this shift. We will continue to monitor and work toward achieving the care plan goals

## 2021-04-05 NOTE — Progress Notes (Signed)
PROGRESS NOTE  Sherry [name]  DOB: [birth year]  PCP: Sherry Fanny, DO JSH:[id number]  DOA: 04/03/2021  LOS: 0 days  Hospital Day: 3   Chief Complaint  Patient presents with   Covid Positive    Brief narrative: Sherry [name] is a 65 y.o. female with PMH significant for HTN, chronic diastolic CHF, CKD 4, chronic alcohol use, history of breast cancer, history of hepatitis C, bipolar disorder, tardive dyskinesia, restless leg syndrome who lives at home alone, gets around with a walker and is on Percocet for chronic pain management. Patient presented to the ED on 9/22 with complaint of nausea and shortness of breath with rest and exertion. Positive COVID test at home on 9/22  In the ED, patient was afebrile, initial blood pressure was 148/64 but while in the ED blood pressure got worse as high as 232/104. Labs with potassium elevated to 6.7, creatinine elevated to 3.54, WBC low at 3.7, hemoglobin at 10.4. EKG showed peaked T waves. COVID PCR positive.  Chest x-ray unremarkable. Hyperkalemia was treated with IV calcium gluconate, 10 units of insulin with D50.  Patient was admitted to hospitalist service for further evaluation and management. See below for details.   Subjective: Patient was seen and examined this morning. Lying on bed.  Not in distress.  Continues to complain of chest wall soreness.  On normal saline at 75 mill per hour.  Creatinine does not improve.  Potassium gradually improving.  Assessment/Plan: Acute on chronic hyperkalemia  -With peak T waves seen on EKG -Patient states that she has been told in the past also that her potassium tends to run high.  She has been instructed to reduce eating potato chips and other potassium containing food.   -Acute elevation in potassium level could be likely due to hypovolemia and AKI. -Presented with potassium level elevated to 6.7 which improved with calcium gluconate IV, insulin and dextrose.  Gradually trending down, potassium level  at 5.6 today. -Continue Lokelma daily.  Diet changed to low potassium Recent Labs  Lab 04/03/21 1609 04/03/21 2258 04/04/21 0505 04/04/21 1322 04/05/21 0516  K 6.7* 5.3* 5.8* 6.0* 5.6*   COVID viral 19 infection -No respiratory symptoms.  Body wall pain probably related to COVID itself.  Chronically on Percocet.  Continue the same. -Continue contact and airborne isolation.   Hypertensive urgency Gastric CHF -While in the ED, systolic blood pressure was elevated up to 232.   -Continued on home meds which include amlodipine, hydralazine, labetalol, Imdur -Blood pressure this morning was over 200 but down to less than 130 in few hours after meds.  IV hydralazine as needed. -Last echocardiogram in 05/2020 with EF of 60-65%   AKI on CKD stage IV -Presented with creatinine of 3.54 which is elevated compared to her baseline of 3.2 from June.  Creatinine today at 3.66.  No improvement with IV fluid.  We will stop it at this time.  Not having any diarrhea or vomiting. Recent Labs    08/21/20 0812 08/21/20 0833 10/17/20 1428 10/21/20 0937 10/22/20 0730 12/19/20 1759 04/03/21 1609 04/04/21 0505 04/04/21 1322 04/05/21 0516  BUN 46* 38* 41* 39* 39* 41* 33* 36* 38* 36*  CREATININE 3.70* 3.53* 3.27* 3.58* 3.60* 3.20* 3.54* 3.33* 3.60* 3.66*    Tardive dyskinesia -Continue Ingrezza   Bipolar disorder -Continue Prozac   Chronic pain -Continue Percocet   Obesity -Body mass index is 39.14 kg/m. Patient has been advised to make an attempt to improve diet and exercise patterns to aid in  weight loss.   Mobility: Encourage ambulation.  PT eval ordered Code Status:   Code Status: Full Code  Nutritional status: Body mass index is 39.14 kg/m.     Diet:  Diet Order             Diet renal with fluid restriction Fluid restriction: 1200 mL Fluid; Room service appropriate? Yes; Fluid consistency: Thin  Diet effective now                  DVT prophylaxis:  heparin injection  5,000 Units Start: 04/03/21 2345   Antimicrobials: None Fluid: Stop IV fluid today Consultants: None Family Communication: None at bedside  Status is: Observation  Remains inpatient appropriate because: Renal function not improving, continues to have low potassium  Dispo: The patient is from: Home              Anticipated d/c is to: Home may need home health.  PT eval pending              Patient currently is not medically stable to d/c.   Difficult to place patient No     Infusions:   sodium chloride 75 mL/hr at 04/05/21 0511    Scheduled Meds:  amLODipine  10 mg Oral Daily   cholecalciferol  2,000 Units Oral Daily   ferrous sulfate  325 mg Oral Q breakfast   FLUoxetine  40 mg Oral Daily   fluticasone  2 spray Each Nare Daily   gabapentin  800 mg Oral TID   heparin  5,000 Units Subcutaneous Q8H   hydrALAZINE  100 mg Oral TID   isosorbide mononitrate  60 mg Oral Daily   labetalol  300 mg Oral BID   oxyCODONE-acetaminophen  2 tablet Oral BID   pantoprazole  20 mg Oral BID   sodium bicarbonate  650 mg Oral BID   sodium zirconium cyclosilicate  10 g Oral Daily   valbenazine  80 mg Oral QHS    Antimicrobials: Anti-infectives (From admission, onward)    None       PRN meds: levalbuterol, ondansetron (ZOFRAN) IV, oxyCODONE-acetaminophen, oxymetazoline   Objective: Vitals:   04/05/21 1048 04/05/21 1257  BP: (!) 202/102 126/66  Pulse: 90 62  Resp: (!) 22 18  Temp: 98.6 F (37 C) 98.7 F (37.1 C)  SpO2: 100% 100%    Intake/Output Summary (Last 24 hours) at 04/05/2021 1334 Last data filed at 04/04/2021 1723 Gross per 24 hour  Intake 65.3 ml  Output --  Net 65.3 ml    Filed Weights   04/03/21 1341  Weight: 97.1 kg   Weight change:  Body mass index is 39.14 kg/m.   Physical Exam: General exam: Pleasant, elderly African-American female.  Not in pain at rest Skin: No rashes, lesions or ulcers. HEENT: Atraumatic, normocephalic, no obvious  bleeding Lungs: Clear to auscultation bilaterally Chest wall: Continues to have mild diffuse tenderness on palpation of chest wall anteriorly CVS: Regular rate and rhythm, no murmur GI/Abd soft, nontender, nondistended, also present CNS: Alert, awake, oriented x3 Psychiatry: Mood appropriate Extremities: No pedal edema, no calf tenderness  Data Review: I have personally reviewed the laboratory data and studies available.  Recent Labs  Lab 04/03/21 1609 04/04/21 0505 04/05/21 0516  WBC 3.7* 5.0 4.0  NEUTROABS 1.9 3.8 1.9  HGB 10.4* 10.1* 9.1*  HCT 32.4* 32.1* 29.8*  MCV 89.0 89.9 92.8  PLT 262 255 188    Recent Labs  Lab 04/03/21 1609 04/03/21 2258 04/04/21 0505  04/04/21 1322 04/05/21 0516  NA 135  --  136 136 139  K 6.7* 5.3* 5.8* 6.0* 5.6*  CL 109  --  109 110 115*  CO2 19*  --  19* 21* 16*  GLUCOSE 88  --  112* 94 81  BUN 33*  --  36* 38* 36*  CREATININE 3.54*  --  3.33* 3.60* 3.66*  CALCIUM 9.1  --  9.0 8.7* 8.7*     F/u labs ordered Unresulted Labs (From admission, onward)     Start     Ordered   04/04/21 0500  Comprehensive metabolic panel  Daily,   R      04/03/21 2245   04/04/21 0500  CBC with Differential/Platelet  Daily,   R      04/03/21 2245            Signed, Terrilee Croak, MD [facility] 04/05/2021

## 2021-04-05 NOTE — Progress Notes (Signed)
   04/05/21 1048  Assess: MEWS Score  Temp 98.6 F (37 C)  BP (!) 202/102  Pulse Rate 90  Resp (!) 22  Level of Consciousness Alert  SpO2 100 %  O2 Device Room Air  Assess: MEWS Score  MEWS Temp 0  MEWS Systolic 2  MEWS Pulse 0  MEWS RR 1  MEWS LOC 0  MEWS Score 3  MEWS Score Color Yellow  Assess: if the MEWS score is Yellow or Red  Were vital signs taken at a resting state? Yes  Focused Assessment No change from prior assessment  Does the patient meet 2 or more of the SIRS criteria? No  MEWS guidelines implemented *See Row Information* Yes  Treat  MEWS Interventions Administered scheduled meds/treatments  Take Vital Signs  Increase Vital Sign Frequency  Yellow: Q 2hr X 2 then Q 4hr X 2, if remains yellow, continue Q 4hrs  Escalate  MEWS: Escalate Yellow: discuss with charge nurse/RN and consider discussing with provider and RRT  Notify: Charge Nurse/RN  Name of Charge Nurse/RN Notified Abigail Butts, RN  Date Charge Nurse/RN Notified 04/05/21  Time Charge Nurse/RN Notified 1100  Notify: Provider  Provider Name/Title Dahal, MD  Date Provider Notified 04/05/21  Time Provider Notified 1100  Notification Type Face-to-face  Notification Reason Change in status  Provider response At bedside  Date of Provider Response 04/05/21  Time of Provider Response 1100  Document  Patient Outcome Stabilized after interventions  Progress note created (see row info) Yes  Assess: SIRS CRITERIA  SIRS Temperature  0  SIRS Pulse 0  SIRS Respirations  1  SIRS WBC 0  SIRS Score Sum  1   Pt in Yellow MEWS d/t elevated BP and increased RR. Charge RN aware. MD at bedside for rounds. RN administered scheduled blood pressure medications. Yellow MEWS guidelines implemented.

## 2021-04-05 NOTE — Evaluation (Signed)
Physical Therapy Evaluation Patient Details Name: Sherry [name] MRN: [id number] DOB: 03-14-1956 Today's Date: 04/05/2021  History of Present Illness  Sherry [name] is a 65 y.o. female with PMH significant for HTN, chronic diastolic CHF, CKD 4, chronic alcohol use, history of breast cancer, history of hepatitis C, bipolar disorder, tardive dyskinesia, restless leg syndrome who lives at home alone, gets around with a walker and is on Percocet for chronic pain management.  Patient presented to the ED on 9/22 with complaint of nausea and shortness of breath with rest and exertion.  Positive COVID test at home on 9/22  Clinical Impression  Pt admitted with above diagnosis. Pt currently with functional limitations due to the deficits listed below (see PT Problem List). Pt will benefit from skilled PT to increase their independence and safety with mobility to allow discharge to the venue listed below.  Pt moved well with bed mobility and transfers to bed > BSC.  She required much encouragement to participate with PT.  She would benefit from [facility] for home environment assessment if she is agreeable.      Recommendations for follow up therapy are one component of a multi-disciplinary discharge planning process, led by the attending physician.  Recommendations may be updated based on patient status, additional functional criteria and insurance authorization.  Follow Up Recommendations Home health PT    Equipment Recommendations  None recommended by PT    Recommendations for Other Services       Precautions / Restrictions Precautions Precautions: Fall      Mobility  Bed Mobility Overal bed mobility: Modified Independent                  Transfers Overall transfer level: Needs assistance   Transfers: Sit to/from Stand;Stand Pivot Transfers Sit to Stand: Supervision Stand pivot transfers: Supervision          Ambulation/Gait Ambulation/Gait assistance: Min assist Gait Distance  (Feet): 15 Feet Assistive device: 1 person hand held assist Gait Pattern/deviations: Decreased step length - right;Decreased step length - left     General Gait Details: HHA and reaching for bed and other external support with other hand  Stairs            Wheelchair Mobility    Modified Rankin (Stroke Patients Only)       Balance Overall balance assessment: Needs assistance           Standing balance-Leahy Scale: Fair                               Pertinent Vitals/Pain Pain Assessment: No/denies pain    Home Living Family/patient expects to be discharged to:: Private residence Living Arrangements: Alone Available Help at Discharge: Personal care attendant (M-F 3 hour each day) Type of Home: House Home Access: Stairs to enter Entrance Stairs-Rails: Psychiatric nurse of Steps: 3 Home Layout: One level Home Equipment: Walker - 4 wheels;Shower seat      Prior Function Level of Independence: Needs assistance      ADL's / Homemaking Assistance Needed: Aide helps with ADLs        Hand Dominance        Extremity/Trunk Assessment   Upper Extremity Assessment Upper Extremity Assessment: Overall WFL for tasks assessed    Lower Extremity Assessment Lower Extremity Assessment: Overall WFL for tasks assessed       Communication   Communication: No difficulties  Cognition Arousal/Alertness: Awake/alert Behavior During Therapy:  Flat affect                                          General Comments      Exercises     Assessment/Plan    PT Assessment Patient needs continued PT services  PT Problem List Decreased strength;Decreased activity tolerance;Decreased balance;Decreased mobility       PT Treatment Interventions DME instruction;Gait training;Functional mobility training;Stair training;Therapeutic activities;Therapeutic exercise;Balance training    PT Goals (Current goals can be found in the  Care Plan section)  Acute Rehab PT Goals Patient Stated Goal: feel better PT Goal Formulation: With patient Time For Goal Achievement: 04/19/21 Potential to Achieve Goals: Good    Frequency Min 3X/week   Barriers to discharge        Co-evaluation               AM-PAC PT "6 Clicks" Mobility  Outcome Measure Help needed turning from your back to your side while in a flat bed without using bedrails?: None Help needed moving from lying on your back to sitting on the side of a flat bed without using bedrails?: None Help needed moving to and from a bed to a chair (including a wheelchair)?: None Help needed standing up from a chair using your arms (e.g., wheelchair or bedside chair)?: A Little Help needed to walk in hospital room?: A Little Help needed climbing 3-5 steps with a railing? : A Lot 6 Click Score: 20    End of Session   Activity Tolerance: Patient limited by fatigue Patient left: in chair;with call bell/phone within reach;with chair alarm set Nurse Communication: Mobility status PT Visit Diagnosis: Unsteadiness on feet (R26.81);Muscle weakness (generalized) (M62.81)    Time: 1203-1229 PT Time Calculation (min) (ACUTE ONLY): 26 min   Charges:   PT Evaluation $PT Eval Moderate Complexity: 1 Mod PT Treatments $Gait Training: 8-22 mins        Gregg Holster L. Tamala Julian, [place]  04/05/2021   Galen Manila 04/05/2021, 1:13 PM

## 2021-04-05 NOTE — Plan of Care (Signed)

## 2021-04-06 ENCOUNTER — Encounter (HOSPITAL_COMMUNITY): Payer: Self-pay | Admitting: Internal Medicine

## 2021-04-06 LAB — CBC WITH DIFFERENTIAL/PLATELET
Abs Immature Granulocytes: 0.01 10*3/uL (ref 0.00–0.07)
Basophils Absolute: 0 10*3/uL (ref 0.0–0.1)
Basophils Relative: 0 %
Eosinophils Absolute: 0.1 10*3/uL (ref 0.0–0.5)
Eosinophils Relative: 2 %
HCT: 28.6 % — ABNORMAL LOW (ref 36.0–46.0)
Hemoglobin: 8.6 g/dL — ABNORMAL LOW (ref 12.0–15.0)
Immature Granulocytes: 0 %
Lymphocytes Relative: 25 %
Lymphs Abs: 1.1 10*3/uL (ref 0.7–4.0)
MCH: 28.1 pg (ref 26.0–34.0)
MCHC: 30.1 g/dL (ref 30.0–36.0)
MCV: 93.5 fL (ref 80.0–100.0)
Monocytes Absolute: 0.4 10*3/uL (ref 0.1–1.0)
Monocytes Relative: 8 %
Neutro Abs: 3 10*3/uL (ref 1.7–7.7)
Neutrophils Relative %: 65 %
Platelets: 244 10*3/uL (ref 150–400)
RBC: 3.06 MIL/uL — ABNORMAL LOW (ref 3.87–5.11)
RDW: 16.9 % — ABNORMAL HIGH (ref 11.5–15.5)
WBC: 4.6 10*3/uL (ref 4.0–10.5)
nRBC: 0 % (ref 0.0–0.2)

## 2021-04-06 LAB — COMPREHENSIVE METABOLIC PANEL
ALT: 10 U/L (ref 0–44)
AST: 11 U/L — ABNORMAL LOW (ref 15–41)
Albumin: 3.5 g/dL (ref 3.5–5.0)
Alkaline Phosphatase: 84 U/L (ref 38–126)
Anion gap: 7 (ref 5–15)
BUN: 38 mg/dL — ABNORMAL HIGH (ref 8–23)
CO2: 20 mmol/L — ABNORMAL LOW (ref 22–32)
Calcium: 8.6 mg/dL — ABNORMAL LOW (ref 8.9–10.3)
Chloride: 110 mmol/L (ref 98–111)
Creatinine, Ser: 3.61 mg/dL — ABNORMAL HIGH (ref 0.44–1.00)
GFR, Estimated: 13 mL/min — ABNORMAL LOW (ref >60)
Glucose, Bld: 96 mg/dL (ref 70–99)
Potassium: 5 mmol/L (ref 3.5–5.1)
Sodium: 137 mmol/L (ref 135–145)
Total Bilirubin: 0.4 mg/dL (ref 0.3–1.2)
Total Protein: 6.6 g/dL (ref 6.5–8.1)

## 2021-04-06 MED ORDER — SODIUM CHLORIDE 0.9 % IV SOLN: INTRAVENOUS | Status: DC

## 2021-04-06 MED ORDER — ONDANSETRON HCL 4 MG/2ML IJ SOLN
4.0000 mg | Freq: Four times a day (QID) | INTRAMUSCULAR | Status: DC | PRN
  Administered 2021-04-06 – 2021-04-07 (×2): 4 mg via INTRAVENOUS
  Filled 2021-04-06 (×3): qty 2

## 2021-04-06 MED ORDER — ZOLPIDEM TARTRATE 5 MG PO TABS
5.0000 mg | ORAL_TABLET | Freq: Every evening | ORAL | Status: DC | PRN
  Administered 2021-04-06: 5 mg via ORAL
  Filled 2021-04-06: qty 1

## 2021-04-06 MED ORDER — HYDRALAZINE HCL 20 MG/ML IJ SOLN
5.0000 mg | Freq: Once | INTRAMUSCULAR | Status: AC
  Administered 2021-04-06: 5 mg via INTRAVENOUS
  Filled 2021-04-06: qty 1

## 2021-04-06 MED ORDER — OXYCODONE-ACETAMINOPHEN 5-325 MG PO TABS
2.0000 | ORAL_TABLET | Freq: Three times a day (TID) | ORAL | Status: DC | PRN
  Administered 2021-04-06 – 2021-04-07 (×3): 2 via ORAL
  Filled 2021-04-06 (×3): qty 2

## 2021-04-06 MED ORDER — SODIUM CHLORIDE 0.9 % IV SOLN: INTRAVENOUS | Status: AC

## 2021-04-06 NOTE — Progress Notes (Signed)
PROGRESS NOTE  Sherry [name]  DOB: 09-29-1955  PCP: Wynelle Fanny, DO UXL:[id number]  DOA: 04/03/2021  LOS: 1 day  Hospital Day: 4   Chief Complaint  Patient presents with   Covid Positive    Brief narrative: Sherry [name] is a 65 y.o. female with PMH significant for HTN, chronic diastolic CHF, CKD 4, chronic alcohol use, history of breast cancer, history of hepatitis C, bipolar disorder, tardive dyskinesia, restless leg syndrome who lives at home alone, gets around with a walker and is on Percocet for chronic pain management. Patient presented to the ED on 9/22 with complaint of nausea and shortness of breath with rest and exertion. Positive COVID test at home on 9/22  In the ED, patient was afebrile, initial blood pressure was 148/64 but while in the ED blood pressure got worse as high as 232/104. Labs with potassium elevated to 6.7, creatinine elevated to 3.54, WBC low at 3.7, hemoglobin at 10.4. EKG showed peaked T waves. COVID PCR positive.  Chest x-ray unremarkable. Hyperkalemia was treated with IV calcium gluconate, 10 units of insulin with D50.  Patient was admitted to hospitalist service for further evaluation and management. See below for details.   Subjective: Patient was seen and examined this morning. Lying on bed.  Not in distress.  No new symptoms.  Continues to complain of chest wall soreness. Labs this morning with potassium improved to normal, creatinine slightly improved.  Assessment/Plan: Acute on chronic hyperkalemia  -With peak T waves seen on EKG -Patient states that she has been told in the past also that her potassium tends to run high.  She has been instructed to reduce eating potato chips and other potassium containing food.   -Acute elevation in potassium level could be likely due to hypovolemia and AKI. -Presented with potassium level elevated to 6.7 which improved with calcium gluconate IV, insulin and dextrose.  Gradually trending down with dietary  restriction and Lokelma daily.  Potassium at 5 today.  Continue to monitor. Recent Labs  Lab 04/03/21 2258 04/04/21 0505 04/04/21 1322 04/05/21 0516 04/06/21 0453  K 5.3* 5.8* 6.0* 5.6* 5.0    COVID viral 19 infection -No respiratory symptoms.  Body wall pain probably related to COVID itself.  Chronically on Percocet.  Continues to have body wall pain.  Percocet dose and frequency adjusted to her home requirement. -Continue contact and airborne isolation.   Hypertensive urgency Gastric CHF -While in the ED, systolic blood pressure was elevated up to 232.   -Continued on home meds which include amlodipine, hydralazine, labetalol, Imdur -Blood pressure this morning was over 200 but down to less than 130 in few hours after meds.  IV hydralazine as needed. -Last echocardiogram in 05/2020 with EF of 60-65%   AKI on CKD stage IV -Presented with creatinine of 3.54 which is elevated compared to her baseline of 3.2 from June.  Creatinine continues to remain elevated.  Nephrology consultation called.   Recent Labs    08/21/20 0833 10/17/20 1428 10/21/20 0937 10/22/20 0730 12/19/20 1759 04/03/21 1609 04/04/21 0505 04/04/21 1322 04/05/21 0516 04/06/21 0453  BUN 38* 41* 39* 39* 41* 33* 36* 38* 36* 38*  CREATININE 3.53* 3.27* 3.58* 3.60* 3.20* 3.54* 3.33* 3.60* 3.66* 3.61*    Tardive dyskinesia -Continue Ingrezza   Bipolar disorder -Continue Prozac   Chronic pain -Continue Percocet   Obesity -Body mass index is 39.14 kg/m. Patient has been advised to make an attempt to improve diet and exercise patterns to aid in weight loss.  Mobility: Encourage ambulation.   PT eval appreciated.  Home with PT recommended Code Status:   Code Status: Full Code  Nutritional status: Body mass index is 39.14 kg/m.     Diet:  Diet Order             Diet renal with fluid restriction Fluid restriction: 1200 mL Fluid; Room service appropriate? Yes; Fluid consistency: Thin  Diet effective  now                  DVT prophylaxis:  heparin injection 5,000 Units Start: 04/03/21 2345   Antimicrobials: None Fluid: Not on IV fluid Consultants: None Family Communication: None at bedside  Status is: Observation  Remains inpatient appropriate because: Renal function not improving, pending nephrology consultation  Dispo: The patient is from: Home              Anticipated d/c is to: Home may need home health.  Likely in 1 to 2 days              Patient currently is not medically stable to d/c.   Difficult to place patient No     Infusions:     Scheduled Meds:  amLODipine  10 mg Oral Daily   cholecalciferol  2,000 Units Oral Daily   ferrous sulfate  325 mg Oral Q breakfast   FLUoxetine  40 mg Oral Daily   fluticasone  2 spray Each Nare Daily   gabapentin  800 mg Oral TID   heparin  5,000 Units Subcutaneous Q8H   hydrALAZINE  100 mg Oral TID   isosorbide mononitrate  60 mg Oral Daily   labetalol  300 mg Oral BID   pantoprazole  20 mg Oral BID   sodium bicarbonate  650 mg Oral BID   sodium zirconium cyclosilicate  10 g Oral Daily   valbenazine  80 mg Oral QHS    Antimicrobials: Anti-infectives (From admission, onward)    None       PRN meds: levalbuterol, ondansetron (ZOFRAN) IV, oxyCODONE-acetaminophen, oxymetazoline   Objective: Vitals:   04/06/21 0333 04/06/21 1003  BP: (!) (P) 171/82 (!) 191/72  Pulse: 75 72  Resp: 20   Temp: (!) 97.4 F (36.3 C)   SpO2: 99%     Intake/Output Summary (Last 24 hours) at 04/06/2021 1144 Last data filed at 04/05/2021 1700 Gross per 24 hour  Intake 480 ml  Output --  Net 480 ml    Filed Weights   04/03/21 1341  Weight: 97.1 kg   Weight change:  Body mass index is 39.14 kg/m.   Physical Exam: General exam: Pleasant, elderly African-American female.  Not in pain at rest Skin: No rashes, lesions or ulcers. HEENT: Atraumatic, normocephalic, no obvious bleeding Lungs: Clear to auscultation  bilaterally Chest wall: Continues to have mild diffuse tenderness on palpation of chest wall anteriorly CVS: Regular rate and rhythm, no murmur GI/Abd soft, nontender, nondistended, also present CNS: Alert, awake, oriented x3 Psychiatry: Mood appropriate Extremities: No pedal edema, no calf tenderness  Data Review: I have personally reviewed the laboratory data and studies available.  Recent Labs  Lab 04/03/21 1609 04/04/21 0505 04/05/21 0516 04/06/21 0453  WBC 3.7* 5.0 4.0 4.6  NEUTROABS 1.9 3.8 1.9 3.0  HGB 10.4* 10.1* 9.1* 8.6*  HCT 32.4* 32.1* 29.8* 28.6*  MCV 89.0 89.9 92.8 93.5  PLT 262 255 188 244    Recent Labs  Lab 04/03/21 1609 04/03/21 2258 04/04/21 0505 04/04/21 1322 04/05/21 0516 04/06/21 0453  NA 135  --  136 136 139 137  K 6.7* 5.3* 5.8* 6.0* 5.6* 5.0  CL 109  --  109 110 115* 110  CO2 19*  --  19* 21* 16* 20*  GLUCOSE 88  --  112* 94 81 96  BUN 33*  --  36* 38* 36* 38*  CREATININE 3.54*  --  3.33* 3.60* 3.66* 3.61*  CALCIUM 9.1  --  9.0 8.7* 8.7* 8.6*     F/u labs ordered Unresulted Labs (From admission, onward)     Start     Ordered   04/04/21 0500  Comprehensive metabolic panel  Daily,   R      04/03/21 2245   04/04/21 0500  CBC with Differential/Platelet  Daily,   R      04/03/21 2245            Signed, Terrilee Croak, MD [facility] 04/06/2021

## 2021-04-06 NOTE — Consult Note (Signed)
Renal Service Consult Note [facility]  Sherry [name] 04/06/2021 Sol Blazing, MD Requesting Physician: Dr. Pietro Cassis  Reason for Consult: Renal failure HPI: The patient is a 65 y.o. year-old w/ hx of etoh abuse, bipolar d/o, h/o breast cancer, CKD stage IV, depression, HTN, hep C presented to ED w/ c/o nausea, SOB on 04/03/21. Pt had tested + for COVID prior to presenting. In eD pt was afeb, BP 148/64, then 232/ 104.  K 6.7, creat 3.5 (usual 2.7). WBC 3K Hb 10, COVID + and CXR negative. She rec'd ca gluconate for high K and insulin/ glucose and pt was admitted.  Asked to see for renal failure and hyperkalemia.   Pt seen in room, no SOB, cough, no leg swelling. She is feeling better. No c/o's today. She is f/b [facility], she is not sure which MD, they are "getting me ready for dialysis". She was supposed to have an US done of her arm on Friday but she was too sick to go.  She denies any current nausea/ vomiting, but did vomit at home.   ROS - denies CP, no joint pain, no HA, no blurry vision, no rash, no diarrhea, no nausea/ vomiting, no dysuria, no difficulty voiding   Past Medical History  Past Medical History:  Diagnosis Date   Alcohol abuse 05/09/2019   Alcohol use disorder, severe, dependence ([place]) 08/27/2017   Alcohol withdrawal ([place]) 08/09/2017   Anxiety    ARF (acute renal failure) ([place]) 04/09/2020   Arthropathy of lumbar facet joint 05/24/2020   Bilateral low back pain with bilateral sciatica 01/05/2019   Bipolar and related disorder ([place])    Blood transfusion without reported diagnosis    Body mass index (BMI) 40.0-44.9, adult ([place]) 07/19/2020   Breast cancer ([place])    right lumpectemy and lymph node    Cancer ([place]) 09/06/2017   Cervical spondylosis 05/24/2020   Chest pain 05/31/2020   Chest pain, rule out acute myocardial infarction 05/29/2020   CHF (congestive heart failure) (HCC)    Chronic back pain    Chronic bilateral low back pain with right-sided sciatica  05/24/2020   Chronic diastolic CHF (congestive heart failure) ([place]) 05/09/2019   Chronic kidney disease    "Stage IV" - states due to hypertension   Chronic kidney disease, stage III (moderate) ([place]) 11/17/2016   Chronic low back pain 09/06/2017   Disc levels:      No abnormality at L2-3 or above.      L3-4:  Mild bulging of the disc.  No stenosis.      L4-5: Bilateral facet arthropathy with gaping, fluid-filled joints.   Joint edema. Anterolisthesis would likely occur at this level with   standing and flexion. Disc degeneration with mild bulging of the   disc. Mild narrowing of the lateral recesses and foramina, which   would likely worsen   Cigarette smoker 12/31/2017   CKD (chronic kidney disease) stage 4, GFR 15-29 ml/min (HCC) 04/18/2020   Clonus 05/24/2020   Cyst of ovary    Depression    Depression with anxiety 08/21/2016   Dizziness 02/24/2018   DOE (dyspnea on exertion) 12/30/2017   12/30/2017   Walked RA x one lap @ 185 stopped due to  Sob/ lightheaded weak with sats 96% at end - Spirometry 12/30/2017  FEV1 1.27 (68%)  Ratio 99 s curvature / abn effort dep portion only     Dyspnea    Dyspnea and respiratory abnormality 02/13/2013   Formatting of this note  might be different from the original. STORY: PSG on 05/20/12 showed no OSA but upper airway resistance syndrome, AHI 3.9, severe snoring was noted. ESS=8. UARS. Advise positional therapy, weight loss program, regular exercise.   Essential hypertension 07/06/2017   GERD (gastroesophageal reflux disease)    Grief reaction with prolonged bereavement 09/06/2017   Partner of 18 yrs left 2018-pt admits she still loves her   Hepatitis C    recieved treatment for Hep C   Hepatitis C virus infection cured after antiviral drug therapy 12/17/2012   Telephone Encounter - Dub Mikes, RN - 12/28/2016 9:54 AM EDT Patient called for HCV RNA test results. EOT 08-26-16 - not detected. 12 week post treatment 12-11-16 - not detected. Informed patient that she was  cured. Patient verbalized understanding.          History of breast cancer 02/13/2013   Hx of bipolar disorder 01/31/2016   Hypertension    Hypertensive emergency 10/21/2017   Impaired functional mobility, balance, gait, and endurance 01/09/2021   Left-sided weakness    MDD (major depressive disorder), recurrent severe, without psychosis ([place])    Medication intolerance 09/06/2017   Remeron-appetite stimulant/Dizziness/Dysphoria   Microcytic anemia 10/21/2017   Morbid obesity due to excess calories ([place]) 02/13/2013   BMI 42.9   MRSA infection    of breast incision   Nausea 04/09/2020   Formatting of this note might be different from the original. Added automatically from request for surgery [id number]   Near syncope 05/29/2020   Neck pain 05/24/2020   Neuroleptic-induced tardive dyskinesia 09/06/2017   Abilify   Opioid dependence ([place])    Has been treated in [facility] in past   Opioid use disorder, severe, dependence ([place]) 08/27/2017   Oral aphthous ulcer 06/25/2015   Last Assessment & Plan:  Formatting of this note might be different from the original. - Appear to be resolving as a be expected in the case of aphthous stomatitis. - Continue topical therapy with viscous lidocaine.  Recommended trying high-dose anti-inflammatory such as 600 mg of ibuprofen as needed. - She'll return to ENT clinic if symptoms do not resolve.   Phlebitis after infusion 10/28/2017   Rt antecubital fossa   Pre-operative cardiovascular examination 06/13/2018   Restless legs syndrome 02/13/2013   Formatting of this note might be different from the original. IMPRESSION: Possible. Will follow.   Spondylolisthesis, lumbar region 10/21/2020   Subacute dyskinesia due to drug 02/13/2013   Formatting of this note might be different from the original. STORY: Tardive dyskinesia and mild limb dyskinesia, LE>UE which likely due to prolonged antipsychotic used, abilify. Couldn't tolerate artane, gabapentin, clonazepam and  xenazine.`E1o3L`IMPRESSION: Well tolerated depakote 283m bid, mood aspect is better as well. Will increase to 5055mbid. ADR was discussed.  RTC 4-6 weeks.   Substance abuse ([place]   Substance induced mood disorder ([place]2/07/2017   TIA (transient ischemic attack) 2020   P/w BP >230/120 and neurologic symptoms, presumed TIA   Past Surgical History  Past Surgical History:  Procedure Laterality Date   BACK SURGERY  1990   BREAST SURGERY Right 2010   "breast cancer survivor" - states partial mastectomy and nodes   COLONOSCOPY     [facility]   ESOPHAGOGASTRODUODENOSCOPY     [facility]   ESOPHAGOGASTRODUODENOSCOPY  04/18/2020   [facility]   LAPAROSCOPIC CHOLECYSTECTOMY  2002   [place]   with BARD 15x [id number] (supraumbilical)  LAPAROSCOPIC LYSIS OF ADHESIONS  07/12/2018   Procedure: LAPAROSCOPIC LYSIS OF ADHESIONS;  Surgeon: Isabel Caprice, MD;  Location: [facility];  Service: Gynecology;;   MULTIPLE TOOTH EXTRACTIONS     MYOMECTOMY     x 2 prior to hysterectomy   ROBOTIC ASSISTED BILATERAL SALPINGO OOPHERECTOMY Right 07/12/2018   Procedure: XI ROBOTIC ASSISTED RIGHT SALPINGO OOPHORECTOMY;  Surgeon: Isabel Caprice, MD;  Location: [facility];  Service: Gynecology;  Laterality: Right;   TOTAL ABDOMINAL HYSTERECTOMY     fibroids    UPPER GASTROINTESTINAL ENDOSCOPY     WISDOM TOOTH EXTRACTION     Family History  Family History  Problem Relation Age of Onset   Heart attack Mother    Breast cancer Mother 59   Dementia Mother    Cancer Father 33       died of bleeding from kidneys   Colon cancer Neg Hx    Esophageal cancer Neg Hx    Stomach cancer Neg Hx    Rectal cancer Neg Hx    Social History  reports that she has been smoking cigarettes. She has been smoking an average of 1 pack per day. She has never used smokeless tobacco. She reports that she does not currently use alcohol. She reports that she  does not currently use drugs. Allergies  Allergies  Allergen Reactions   Abilify [Aripiprazole] Other (See Comments)    Tardive dyskinesia Oral   Remeron [Mirtazapine] Other (See Comments)    Wgt stimulation /gain, Dizziness, Patient says "can tolerate"   Trazodone And Nefazodone Other (See Comments)    Nightmares/sleep diturbance   Flexeril [Cyclobenzaprine] Other (See Comments)    Pt states Flexeril makes her feel depressed    Amoxicillin Diarrhea and Other (See Comments)    NOTE the patient has had PCN WITHOUT reaction Has patient had a PCN reaction causing immediate rash, facial/tongue/throat swelling, SOB or lightheadedness with hypotension: No Has patient had a PCN reaction causing severe rash involving mucus membranes or skin necrosis: No Has patient had a PCN reaction that required hospitalization: No Has patient had a PCN reaction occurring within the last 10 years: No If all of the above answers are "NO", then may proceed with Cephalosporin use.    Home medications Prior to Admission medications   Medication Sig Start Date End Date Taking? Authorizing Provider  amLODipine (NORVASC) 10 MG tablet Take 10 mg by mouth daily.   Yes [provider]  Cholecalciferol (VITAMIN D3) 50 MCG (2000 UT) capsule Take 1 capsule (2,000 Units total) by mouth daily. Patient taking differently: Take 2,000 Units by mouth in the morning. 09/26/20  Yes Wendie Agreste, MD  diphenhydramine-acetaminophen (TYLENOL PM) 25-500 MG TABS tablet Take 1 tablet by mouth at bedtime as needed.   Yes [provider]  ferrous sulfate 325 (65 FE) MG tablet Take 1 tablet by mouth daily with breakfast.   Yes [provider]  FLUoxetine (PROZAC) 40 MG capsule Take 40 mg by mouth in the morning. 11/09/18  Yes [provider]  furosemide (LASIX) 20 MG tablet Take 20 mg by mouth daily. 07/31/20  Yes [provider]  gabapentin (NEURONTIN) 800 MG tablet Take 800 mg by mouth 3  (three) times daily. 03/24/21  Yes [provider]  hydrALAZINE (APRESOLINE) 100 MG tablet Take 100 mg by mouth 3 (three) times daily. 09/26/20  Yes [provider]  INGREZZA 80 MG CAPS Take 80 mg by mouth at bedtime. 10/15/17  Yes [provider]  isosorbide mononitrate (IMDUR) 60 MG 24 hr tablet Take 60 mg by mouth daily.   Yes [provider]  labetalol (NORMODYNE) 300 MG tablet Take 300 mg by mouth 2 (two) times daily.   Yes [provider]  melatonin 5 MG TABS Take 5 mg by mouth at bedtime as needed.   Yes [provider]  oxyCODONE-acetaminophen (PERCOCET) 10-325 MG tablet Take 1 tablet by mouth 2 (two) times daily as needed. 03/24/21  Yes [provider]  pantoprazole (PROTONIX) 20 MG tablet TAKE 1 TABLET(20 MG) BY MOUTH TWICE DAILY 02/07/21  Yes Wendie Agreste, MD  sodium bicarbonate 650 MG tablet Take 650 mg by mouth 2 (two) times daily. 09/04/20  Yes [provider]  docusate sodium (COLACE) 100 MG capsule Take 1 capsule (100 mg total) by mouth 2 (two) times daily. Patient not taking: Reported on 04/04/2021 10/23/20   Viona Gilmore D, NP  gabapentin (NEURONTIN) 400 MG capsule Take 1 capsule (400 mg total) by mouth 2 (two) times daily for 15 days. Patient not taking: No sig reported 03/19/21 04/04/21  Mesner, Corene Cornea, MD  hyoscyamine (LEVSIN) 0.125 MG tablet TAKE 1 TABLET(0.125 MG) BY MOUTH EVERY 6 HOURS AS NEEDED FOR CRAMPING OR ABDOMINAL PAIN Patient not taking: No sig reported 01/18/21   Wendie Agreste, MD  lidocaine (LIDODERM) 5 % Place 1 patch onto the skin daily. Remove & Discard patch within 12 hours or as directed by MD Patient not taking: Reported on 04/04/2021 10/10/20   Nuala Alpha A, PA-C  methocarbamol (ROBAXIN) 500 MG tablet Take 1 tablet (500 mg total) by mouth 2 (two) times daily. Patient not taking: Reported on 04/04/2021 02/17/21   Jacqlyn Larsen, PA-C  methylPREDNISolone (MEDROL DOSEPAK) 4 MG TBPK tablet  Take as directed by mouth, see back of pack for instructions Patient not taking: Reported on 04/04/2021 02/17/21   Jacqlyn Larsen, PA-C  ondansetron (ZOFRAN) 4 MG tablet Take 4 mg by mouth every 8 (eight) hours as needed for nausea/vomiting. Patient not taking: Reported on 04/04/2021    [provider]     Vitals:   04/05/21 2237 04/06/21 0333 04/06/21 1003 04/06/21 1343  BP: 127/64 (!) (P) 171/82 (!) 191/72 137/66  Pulse: (!) 59 75 72 62  Resp: _0 Temp: 98.2 F (36.8 C) (!) 97.4 F (36.3 C)  98.5 F (36.9 C)  TempSrc: Oral (P) Axillary  Oral  SpO2: 98% 99%  99%  Weight:      Height:       Exam  Gen alert, no distress No rash, cyanosis or gangrene Sclera anicteric, throat clear  No jvd or bruits Chest clear bilat to bases, no rales/ wheezing RRR no MRG Abd soft ntnd no mass or ascites +bs GU defer MS no joint effusions or deformity Ext no LE or UE edema, no wounds or ulcers Neuro is alert, Ox 3 , nf     Home meds include - norvasc 10, prozac, lasix 20 qd, hydralazine 100 tid, ingrezza 80 hs, imdur 60, labetalol 300 bid, protonix, sod bicarb, neurontin 400 bid and prn's/ vitamins     Date   Creat  eGFR   2016- 2019  1.44- 2.12   2020   2.15- 2.93   May - nov 2021 2.53- 3.11 18- 21, IV   Jan- feb 2022 2.74- 3.70 14- 19, stage IV    April -June 2022 3.20- 3.60 14-15, stage V   Sept 22, 2022  3.54  14      Sept 23  3.60    Sept 24  3.66    Sept 25  3.61  13     UA - not done   UNa, UCr not done   Renal US - pending    CXR 9/22 - IMPRESSION: Cardiomegaly.  No focal pulmonary process.     BP's 135- 180/ 64- 96   RR 18  HR 65  RA 97%    Assessment/ Plan: CKD stage V - pt's renal function have been declining for the last year somewhat rapidly. Pt is f/b [facility] and was supposed to have for ultrasound earlier this week in prep for access placement. She was admitted for nausea / SOB and is COVID+. On exam in euvolemic. Creat is 3.5 here, not far from recent  baseline 3.2- 3.6.  She may be a bit dry given COVID infection and nausea. Will give IVF's overnight, see if creat improves any. Otherwise, renal function is at baseline and pt doesn't require hospitalization from a renal standpoint now that K+ has been corrected. No other suggestions. Can be dc'd from our standpoint. Will sign off.  HD access prep - pt requesting we order [place] of arm veins as she missed appt on 9/23 due to being sick.  Will order vein mapping.  Hyperkalemia - corrected w/ lokelma and low K diet HTN - stable BP's      Rob Chenay Nesmith  MD 04/06/2021, 3:20 PM  Recent Labs  Lab 04/05/21 0516 04/06/21 0453  WBC 4.0 4.6  HGB 9.1* 8.6*   Recent Labs  Lab 04/05/21 0516 04/06/21 0453  K 5.6* 5.0  BUN 36* 38*  CREATININE 3.66* 3.61*  CALCIUM 8.7* 8.6*

## 2021-04-07 ENCOUNTER — Inpatient Hospital Stay (HOSPITAL_COMMUNITY): Payer: Medicare HMO

## 2021-04-07 DIAGNOSIS — N185 Chronic kidney disease, stage 5: Secondary | ICD-10-CM

## 2021-04-07 LAB — COMPREHENSIVE METABOLIC PANEL
ALT: 8 U/L (ref 0–44)
AST: 11 U/L — ABNORMAL LOW (ref 15–41)
Albumin: 3.6 g/dL (ref 3.5–5.0)
Alkaline Phosphatase: 82 U/L (ref 38–126)
Anion gap: 7 (ref 5–15)
BUN: 33 mg/dL — ABNORMAL HIGH (ref 8–23)
CO2: 20 mmol/L — ABNORMAL LOW (ref 22–32)
Calcium: 9.1 mg/dL (ref 8.9–10.3)
Chloride: 116 mmol/L — ABNORMAL HIGH (ref 98–111)
Creatinine, Ser: 3.72 mg/dL — ABNORMAL HIGH (ref 0.44–1.00)
GFR, Estimated: 13 mL/min — ABNORMAL LOW (ref >60)
Glucose, Bld: 93 mg/dL (ref 70–99)
Potassium: 4.9 mmol/L (ref 3.5–5.1)
Sodium: 143 mmol/L (ref 135–145)
Total Bilirubin: 0.3 mg/dL (ref 0.3–1.2)
Total Protein: 6.5 g/dL (ref 6.5–8.1)

## 2021-04-07 LAB — CBC WITH DIFFERENTIAL/PLATELET
Abs Immature Granulocytes: 0.02 10*3/uL (ref 0.00–0.07)
Basophils Absolute: 0 10*3/uL (ref 0.0–0.1)
Basophils Relative: 0 %
Eosinophils Absolute: 0.1 10*3/uL (ref 0.0–0.5)
Eosinophils Relative: 2 %
HCT: 29 % — ABNORMAL LOW (ref 36.0–46.0)
Hemoglobin: 8.9 g/dL — ABNORMAL LOW (ref 12.0–15.0)
Immature Granulocytes: 0 %
Lymphocytes Relative: 24 %
Lymphs Abs: 1.2 10*3/uL (ref 0.7–4.0)
MCH: 28.5 pg (ref 26.0–34.0)
MCHC: 30.7 g/dL (ref 30.0–36.0)
MCV: 92.9 fL (ref 80.0–100.0)
Monocytes Absolute: 0.4 10*3/uL (ref 0.1–1.0)
Monocytes Relative: 8 %
Neutro Abs: 3.3 10*3/uL (ref 1.7–7.7)
Neutrophils Relative %: 66 %
Platelets: 240 10*3/uL (ref 150–400)
RBC: 3.12 MIL/uL — ABNORMAL LOW (ref 3.87–5.11)
RDW: 16.6 % — ABNORMAL HIGH (ref 11.5–15.5)
WBC: 5.1 10*3/uL (ref 4.0–10.5)
nRBC: 0 % (ref 0.0–0.2)

## 2021-04-07 IMAGING — MR MR CERVICAL SPINE W/O CM
3 series · 37 of 48 positions shown · non-contrast
Comparison: From cervical CT myelogram 07/20/2001 (images
unavailable). Of the cervical spine 05/24/2020. Report

CLINICAL DATA: Cervicalgia. Additional history provided by scanning
technologist: Patient reports chronic neck and lower back pain with
pain/weakness/numbness in right leg, history of breast cancer.

EXAM:
MRI CERVICAL SPINE WITHOUT CONTRAST
TECHNIQUE: Multiplanar, multisequence MR imaging of the cervical spine was
performed. No intravenous contrast was administered.

[Series 3: T2 · sagittal · 3.0mm · 0.82mm/px · 11 of 15 slices shown (1 of 2)]
[im 1/15]
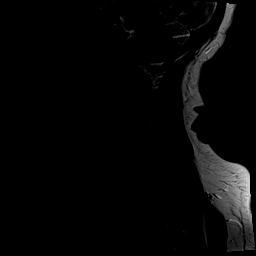
[im 2/15]
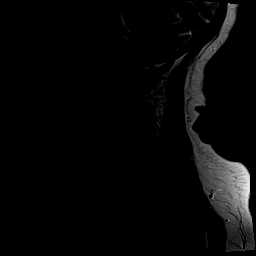
[im 3/15]
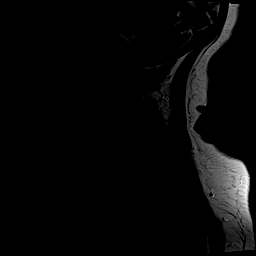
[im 5/15]
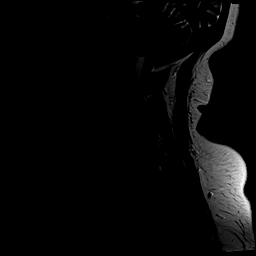
[im 6/15]
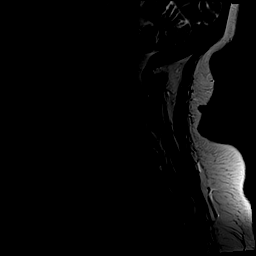
[im 8/15]
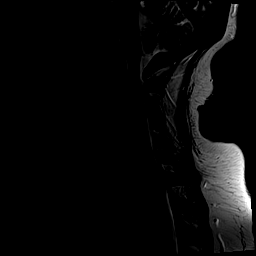
[im 9/15]
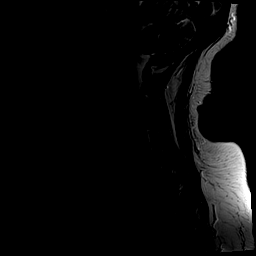
[im 10/15]
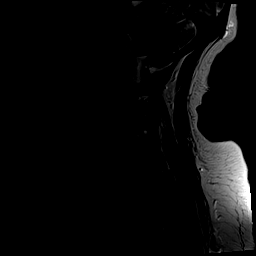
[im 12/15]
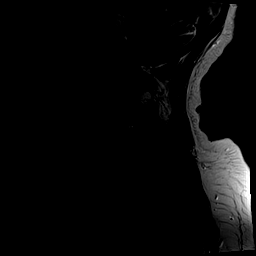
[im 13/15]
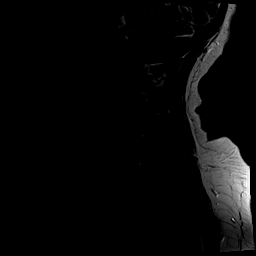
[im 15/15]
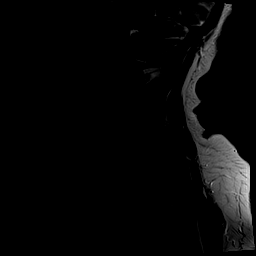

[Series 4: T1 · sagittal · 3.0mm · 0.41mm/px · 9 of 15 slices shown]
[im 1/15]
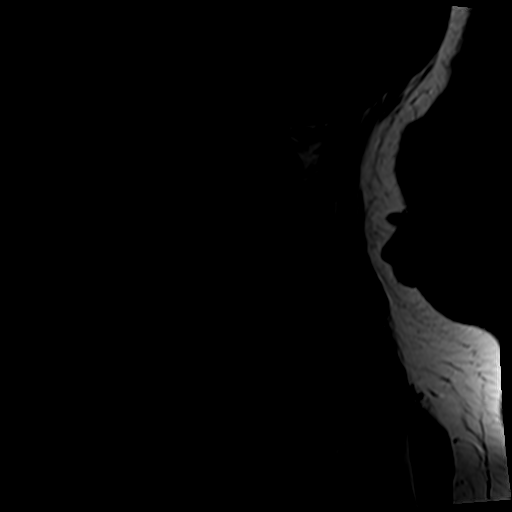
[im 3/15]
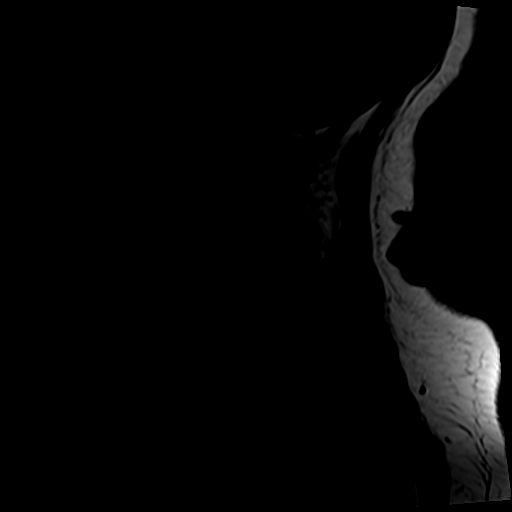
[im 4/15]
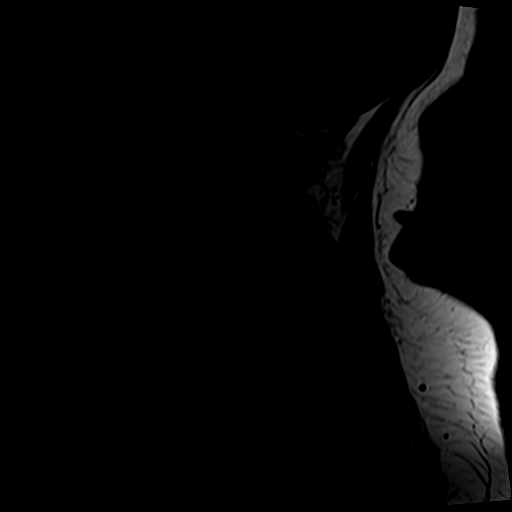
[im 7/15]
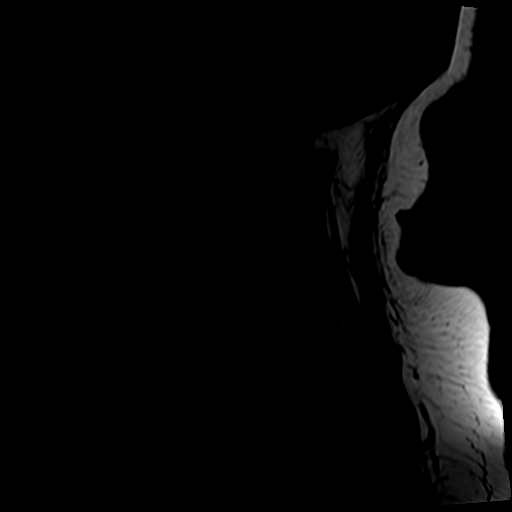
[im 8/15]
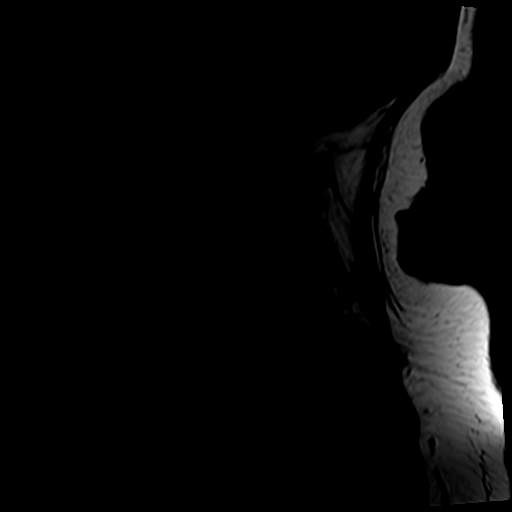
[im 11/15]
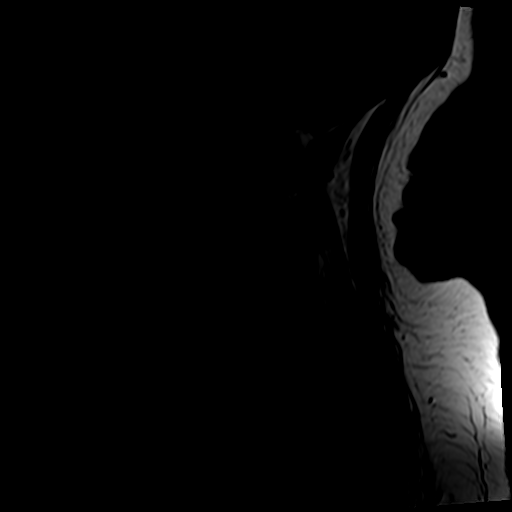
[im 12/15]
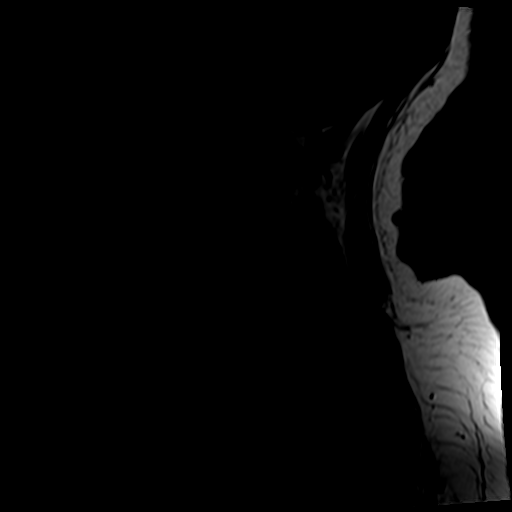
[im 13/15]
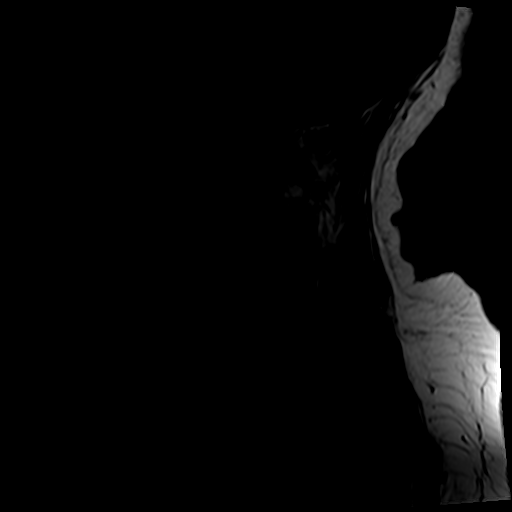
[im 15/15]
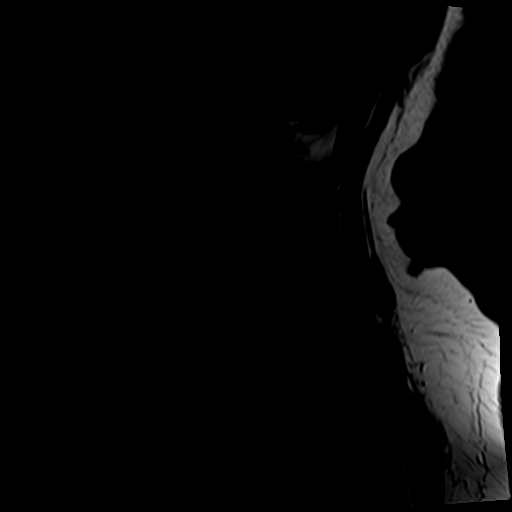

[Series 7: T2 · axial · 3.0mm · 0.70mm/px · z∈[-104,+2]mm · 17 of 32 slices shown (2 of 2)]
[im 1/32]
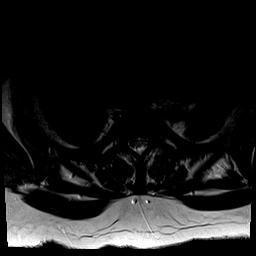
[im 2/32]
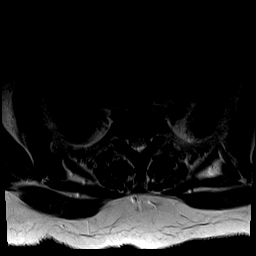
[im 3/32]
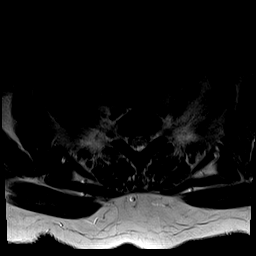
[im 4/32]
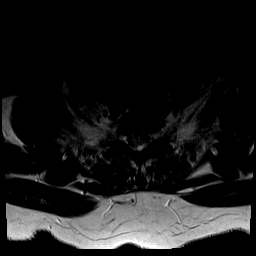
[im 6/32]
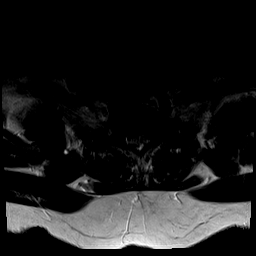
[im 7/32]
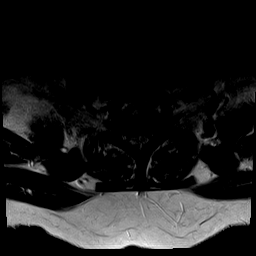
[im 8/32]
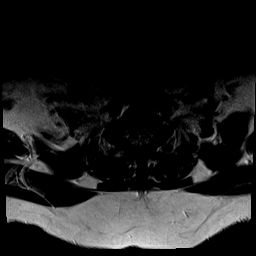
[im 10/32]
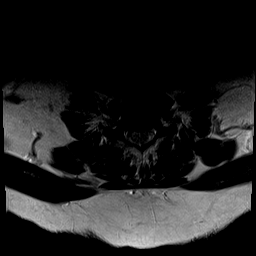
[im 11/32]
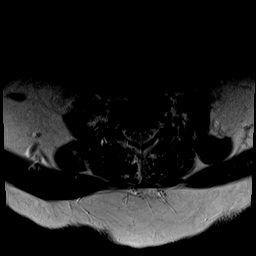
[im 12/32]
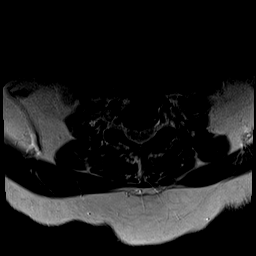
[im 13/32]
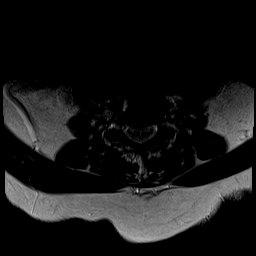
[im 15/32]
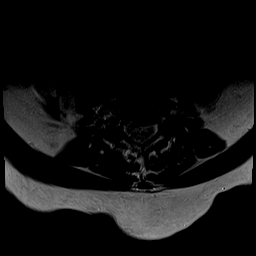
[im 16/32]
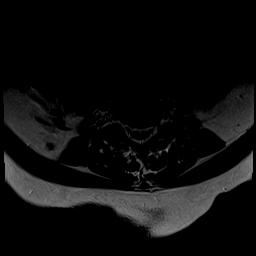
[im 19/32]
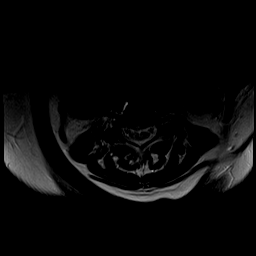
[im 22/32]
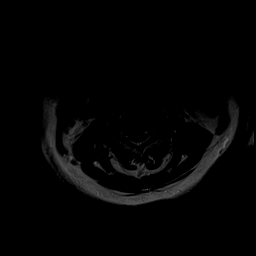
[im 26/32]
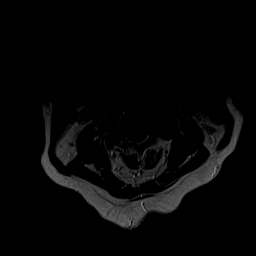
[im 30/32]
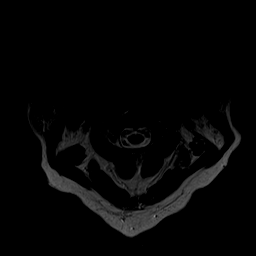

[37 of 48 positions shown; findings below may reference images not displayed]

FINDINGS: Intermittently motion degraded examination. Most notably, there is
moderate motion degradation of the axial sequences and
moderate/severe motion degradation of the sagittal T2/STIR sequence.

Alignment: Straightening of the expected cervical lordosis. Trace
C3-C4 grade 1 retrolisthesis.

Vertebrae: Mild chronic C7 superior endplate deformity. Prior C4-C6
ACDF. Susceptibility artifact arising from the spinal fusion
hardware. Fatty degenerative endplate marrow signal at C2-C3, C6-C7
and C7-T1. No appreciable significant marrow edema. Diffuse T1
hypointense marrow signal.

Cord: Within the limitations of motion degradation, no definite
spinal cord signal abnormality is identified.

Posterior Fossa, vertebral arteries, paraspinal tissues: No
abnormality identified within included portions of the posterior
fossa. Flow voids preserved within the imaged cervical vertebral
arteries. Paraspinal soft tissues within normal limits.

Disc levels:

Multilevel disc degeneration at the non fused levels. Most notably,
there is moderate disc degeneration at C2-C3 and C3-C4 and
moderate/advanced disc degeneration at C7-T1.

C2-C3: Posterior disc osteophyte complex. Uncovertebral hypertrophy.
Mild/moderate spinal canal stenosis with contact upon the ventral
spinal cord. Mild right neural foraminal narrowing.

C3-C4: Posterior disc osteophyte complex. Uncovertebral hypertrophy.
Mild/moderate spinal canal stenosis with mild flattening of the
ventral spinal cord. No significant foraminal stenosis.

C4-C5: Prior ACDF.  No residual/recurrent stenosis.

C5-C6: Prior ACDF. Uncovertebral and facet hypertrophy. No
significant residual/recurrent spinal canal stenosis. Mild left
neural foraminal narrowing.

C6-C7: Subtle endplate osteophytes. No significant spinal canal or
foraminal stenosis.

C7-T1: Disc bulge. Bilateral disc osteophyte ridge/uncinate
hypertrophy. Facet and ligamentum flavum hypertrophy. Mild spinal
canal stenosis with subtle flattening of the ventral spinal cord.
Bilateral neural foraminal narrowing (mild right, moderate/severe
left).
IMPRESSION: Prior C4-C6 ACDF. No residual/recurrent spinal canal stenosis at
these levels. Uncovertebral and facet hypertrophy contribute to mild
left C5-C6 neural foraminal narrowing.

Cervical spondylosis at the remaining levels as outlined and with
findings most notably as follows.

At C2-C3, a posterior disc osteophyte complex contributes to
mild/moderate spinal canal stenosis with contact upon the ventral
spinal cord.

At C3-C4, a posterior disc osteophyte complex contributes to
mild/moderate spinal canal stenosis with mild flattening of the
ventral spinal cord.

At C7-T1, there is moderate/advanced disc degeneration.
Multifactorial mild spinal canal stenosis with subtle flattening of
the ventral spinal cord. Bilateral neural foraminal narrowing (mild
right, moderate/severe left).

Diffuse T1 hypointense marrow signal. While these findings can
reflect a marrow infiltrative process, the most common causes
include chronic anemia, smoking and obesity.

## 2021-04-07 NOTE — TOC Transition Note (Signed)
Transition of Care [facility]) - CM/SW Discharge Note   Patient Details  Name: Sherry [name] MRN: [id number] Date of Birth: 1956/04/12  [facility] of [facility]) CM/SW Contact:  Trish Mage, LCSW Phone Number: 04/07/2021, 12:26 PM   Clinical Narrative:   COVID positive patient will return home via [facility].  She is open to [facility] PT recommendation. Cindie with [name] agreed to provide services. No further needs identified.  TOC sign off.    Final next level of care: [place] Barriers to Discharge: No Barriers Identified   Patient Goals and CMS Choice        Discharge Placement                       Discharge Plan and Services                                     Social Determinants of Health (SDOH) Interventions     Readmission Risk Interventions No flowsheet data found.

## 2021-04-07 NOTE — Discharge Summary (Signed)
Physician Discharge Summary  Sherry [name] XTK:[id number] DOB: Mar 15, 1956 DOA: 04/03/2021  PCP: Wynelle Fanny, DO  Admit date: 04/03/2021 Discharge date: 04/07/2021  Admitted From: Home Discharge disposition: Home with home health   Code Status: Full Code   Discharge Diagnosis:   Principal Problem:   Hyperkalemia Active Problems:   Hx of bipolar disorder   Morbid obesity due to excess calories (HCC)   Chronic low back pain   Chronic diastolic CHF (congestive heart failure) ([place])   Bipolar and related disorder ([place])   Hypertensive urgency   Tardive dyskinesia   COVID-19 virus infection   Chief Complaint  Patient presents with   Covid Positive    Brief narrative: Sherry [name] is a 65 y.o. female with PMH significant for HTN, chronic diastolic CHF, CKD 4, chronic alcohol use, history of breast cancer, history of hepatitis C, bipolar disorder, tardive dyskinesia, restless leg syndrome who lives at home alone, gets around with a walker and is on Percocet for chronic pain management. Patient presented to the ED on 9/22 with complaint of nausea and shortness of breath with rest and exertion. Positive COVID test at home on 9/22  In the ED, patient was afebrile, initial blood pressure was 148/64 but while in the ED blood pressure got worse as high as 232/104. Labs with potassium elevated to 6.7, creatinine elevated to 3.54, WBC low at 3.7, hemoglobin at 10.4. EKG showed peaked T waves. COVID PCR positive.  Chest x-ray unremarkable. Hyperkalemia was treated with IV calcium gluconate, 10 units of insulin with D50.  Patient was admitted to hospitalist service for further evaluation and management. See below for details.   Subjective: Patient was seen and examined this morning. Lying down in bed.  Chest soreness improving.  Potassium normalized.  Creatinine remains elevated but stable  Hospital course Acute on chronic hyperkalemia  -With peak T waves seen on EKG -Patient states that  she has been told in the past also that her potassium tends to run high.  She has been instructed to reduce eating potato chips and other potassium containing food.   -Acute elevation in potassium level could be likely due to hypovolemia and AKI. -Presented with potassium level elevated to 6.7 which improved with calcium gluconate IV, insulin and dextrose.  Gradually trending down with dietary restriction and Lokelma daily.  Potassium at 4.9 today.  Patient has been instructed to avoid potassium containing food post discharge. Recent Labs  Lab 04/04/21 0505 04/04/21 1322 04/05/21 0516 04/06/21 0453 04/07/21 0441  K 5.8* 6.0* 5.6* 5.0 4.9    AKI on CKD stage IV -Presented with creatinine of 3.54 which is elevated compared to her baseline of 3.2 from June.  Creatinine continues to remain elevated.  Nephrology consultation called.   -Per nephrology, patient was supposed to follow-up with [facility] earlier last week to have a repeat ultrasound in preparation for access placement she missed the appointment because of COVID-positive.  She will continue to follow-up with [facility] as an outpatient.  She needs vein mapping in preparation of AV fistula creation. Recent Labs    10/17/20 1428 10/21/20 0937 10/22/20 0730 12/19/20 1759 04/03/21 1609 04/04/21 0505 04/04/21 1322 04/05/21 0516 04/06/21 0453 04/07/21 0441  BUN 41* 39* 39* 41* 33* 36* 38* 36* 38* 33*  CREATININE 3.27* 3.58* 3.60* 3.20* 3.54* 3.33* 3.60* 3.66* 3.61* 3.72*   COVID viral 19 infection -No respiratory symptoms.  Body wall pain probably related to COVID itself.  Chronically on Percocet.  Continues to have body  wall pain.  Percocet dose and frequency adjusted to her home requirement. -Continue contact and airborne isolation for 10 days after first diagnosis on 9/22.   Hypertensive urgency Gastric CHF -While in the ED, systolic blood pressure was elevated up to 232.   -Continued on home meds which include amlodipine, hydralazine,  labetalol, Imdur -Blood pressure tends to run elevated in the morning prior to getting meds.  Blood pressure improved after morning meds administered. -Last echocardiogram in 05/2020 with EF of 60-65%.  Tardive dyskinesia -Continue Ingrezza   Bipolar disorder -Continue Prozac   Chronic pain -Continue Percocet.  Does not have a pain management doctor, gets medicines from her primary care provider.   Obesity -Body mass index is 39.14 kg/m. Patient has been advised to make an attempt to improve diet and exercise patterns to aid in weight loss.   Allergies as of 04/07/2021       Reactions   Abilify [aripiprazole] Other (See Comments)   Tardive dyskinesia Oral   Remeron [mirtazapine] Other (See Comments)   Wgt stimulation /gain, Dizziness, Patient says "can tolerate"   Trazodone And Nefazodone Other (See Comments)   Nightmares/sleep diturbance   Flexeril [cyclobenzaprine] Other (See Comments)   Pt states Flexeril makes her feel depressed    Amoxicillin Diarrhea, Other (See Comments)   NOTE the patient has had PCN WITHOUT reaction Has patient had a PCN reaction causing immediate rash, facial/tongue/throat swelling, SOB or lightheadedness with hypotension: No Has patient had a PCN reaction causing severe rash involving mucus membranes or skin necrosis: No Has patient had a PCN reaction that required hospitalization: No Has patient had a PCN reaction occurring within the last 10 years: No If all of the above answers are "NO", then may proceed with Cephalosporin use.        Medication List     STOP taking these medications    diphenhydramine-acetaminophen 25-500 MG Tabs tablet Commonly known as: TYLENOL PM   docusate sodium 100 MG capsule Commonly known as: COLACE   hyoscyamine 0.125 MG tablet Commonly known as: LEVSIN   lidocaine 5 % Commonly known as: Lidoderm   methocarbamol 500 MG tablet Commonly known as: ROBAXIN   methylPREDNISolone 4 MG Tbpk tablet Commonly  known as: MEDROL DOSEPAK   ondansetron 4 MG tablet Commonly known as: ZOFRAN       TAKE these medications    amLODipine 10 MG tablet Commonly known as: NORVASC Take 10 mg by mouth daily.   ferrous sulfate 325 (65 FE) MG tablet Take 1 tablet by mouth daily with breakfast.   FLUoxetine 40 MG capsule Commonly known as: PROZAC Take 40 mg by mouth in the morning.   furosemide 20 MG tablet Commonly known as: LASIX Take 20 mg by mouth daily.   gabapentin 800 MG tablet Commonly known as: NEURONTIN Take 800 mg by mouth 3 (three) times daily. What changed: Another medication with the same name was removed. Continue taking this medication, and follow the directions you see here.   hydrALAZINE 100 MG tablet Commonly known as: APRESOLINE Take 100 mg by mouth 3 (three) times daily.   Ingrezza 80 MG capsule Generic drug: valbenazine Take 80 mg by mouth at bedtime.   isosorbide mononitrate 60 MG 24 hr tablet Commonly known as: IMDUR Take 60 mg by mouth daily.   labetalol 300 MG tablet Commonly known as: NORMODYNE Take 300 mg by mouth 2 (two) times daily.   melatonin 5 MG Tabs Take 5 mg by mouth at bedtime as  needed.   oxyCODONE-acetaminophen 10-325 MG tablet Commonly known as: PERCOCET Take 1 tablet by mouth 2 (two) times daily as needed.   pantoprazole 20 MG tablet Commonly known as: PROTONIX TAKE 1 TABLET(20 MG) BY MOUTH TWICE DAILY   sodium bicarbonate 650 MG tablet Take 650 mg by mouth 2 (two) times daily.   Vitamin D3 50 MCG (2000 UT) capsule Take 1 capsule (2,000 Units total) by mouth daily. What changed: when to take this        Discharge Instructions:  Diet Recommendation:  Discharge Diet Orders (From admission, onward)     Start     Ordered   04/07/21 0000  Diet - low sodium heart healthy      Low potassium diet.   04/07/21 1156              Follow with Primary MD Wynelle Fanny, DO in 7 days   Get CBC/BMP checked in next visit within 1 week  by PCP or SNF MD ( we routinely change or add medications that can affect your baseline labs and fluid status, therefore we recommend that you get the mentioned basic workup next visit with your PCP, your PCP may decide not to get them or add new tests based on their clinical decision)  On your next visit with your PCP, please Get Medicines reviewed and adjusted.  Please request your PCP  to go over all Hospital Tests and Procedure/Radiological results at the follow up, please get all Hospital records sent to your Prim MD by signing hospital release before you go home.  Activity: As tolerated with Full fall precautions use walker/cane & assistance as needed  For Heart failure patients - Check your Weight same time everyday, if you gain over 2 pounds, or you develop in leg swelling, experience more shortness of breath or chest pain, call your Primary MD immediately. Follow Cardiac Low Salt Diet and 1.5 lit/day fluid restriction.  If you have smoked or chewed Tobacco in the last 2 yrs please stop smoking, stop any regular Alcohol  and or any Recreational drug use.  If you experience worsening of your admission symptoms, develop shortness of breath, life threatening emergency, suicidal or homicidal thoughts you must seek medical attention immediately by calling 911 or calling your MD immediately  if symptoms less severe.  You Must read complete instructions/literature along with all the possible adverse reactions/side effects for all the Medicines you take and that have been prescribed to you. Take any new Medicines after you have completely understood and accpet all the possible adverse reactions/side effects.   Do not drive, operate heavy machinery, perform activities at heights, swimming or participation in water activities or provide baby sitting services if your were admitted for syncope or siezures until you have seen by Primary MD or a Neurologist and advised to do so again.  Do not drive when  taking Pain medications.  Do not take more than prescribed Pain, Sleep and Anxiety Medications  Wear Seat belts while driving.   Please note You were cared for by a hospitalist during your hospital stay. If you have any questions about your discharge medications or the care you received while you were in the hospital after you are discharged, you can call the unit and asked to speak with the hospitalist on call if the hospitalist that took care of you is not available. Once you are discharged, your primary care physician will handle any further medical issues. Please note that NO REFILLS for  any discharge medications will be authorized once you are discharged, as it is imperative that you return to your primary care physician (or establish a relationship with a primary care physician if you do not have one) for your aftercare needs so that they can reassess your need for medications and monitor your lab values.    Follow ups:    Follow-up Information     Wynelle Fanny, DO Follow up.   Specialty: Internal Medicine Contact information: [address] [place] [address] [phone]         Jenean Lindau, MD .   Specialty: Cardiology Contact information: [address] [place] [address] [phone]                 Wound care:   Incision (Closed) 10/21/20 Back Other (Comment) (Active)  Date First Assessed/Time First Assessed: 10/21/20 1641   Location: Back  Location Orientation: Other (Comment)    Assessments 10/21/2020  4:55 PM 10/23/2020 11:35 AM  Dressing Type Adhesive strips;Honeycomb Gauze (Comment);Abdominal pads  Dressing Clean;Dry;Intact Changed  Site / Wound Assessment Dressing in place / Unable to assess --  Margins -- Attached edges (approximated)  Closure -- Approximated;Adhesive strips;Staples  Drainage Amount None Scant  Drainage Description -- No odor     No Linked orders to display    Discharge Exam:   Vitals:   04/07/21 0326  04/07/21 0836 04/07/21 0936 04/07/21 1039  BP: (!) 189/80 (!) 220/98 (!) 157/69 (!) 126/57  Pulse: 77 70 66 63  Resp: 20 18 20  (!) 22  Temp: 98 F (36.7 C) 98.6 F (37 C) 98.6 F (37 C) 98.7 F (37.1 C)  TempSrc: Oral Oral Oral Oral  SpO2: 98% 100% 100% 96%  Weight:      Height:        Body mass index is 39.14 kg/m.  General exam: Pleasant, elderly African-American female.  Not in pain at rest Skin: No rashes, lesions or ulcers. HEENT: Atraumatic, normocephalic, no obvious bleeding Lungs: Clear to auscultation bilaterally Chest wall: Chest soreness improving. CVS: Regular rate and rhythm, no murmur GI/Abd soft, nontender, nondistended, also present CNS: Alert, awake, oriented x3 Psychiatry: Mood appropriate Extremities: No pedal edema, no calf tenderness  Time coordinating discharge: 35 minutes   The results of significant diagnostics from this hospitalization (including imaging, microbiology, ancillary and laboratory) are listed below for reference.    Procedures and Diagnostic Studies:   DG Chest Portable 1 View  Result Date: 04/03/2021 CLINICAL DATA:  covid + EXAM: PORTABLE CHEST 1 VIEW COMPARISON:  None. FINDINGS: Cardiomegaly. No pleural effusion. No pneumothorax. No mass or consolidation. Partially visualized cervical fusion hardware. IMPRESSION: Cardiomegaly.  No focal pulmonary process. Electronically Signed   By: Albin Felling M.D.   On: 04/03/2021 14:47     Labs:   Basic Metabolic Panel: Recent Labs  Lab 04/04/21 0505 04/04/21 1322 04/05/21 0516 04/06/21 0453 04/07/21 0441  NA 136 136 139 137 143  K 5.8* 6.0* 5.6* 5.0 4.9  CL 109 110 115* 110 116*  CO2 19* 21* 16* 20* 20*  GLUCOSE 112* 94 81 96 93  BUN 36* 38* 36* 38* 33*  CREATININE 3.33* 3.60* 3.66* 3.61* 3.72*  CALCIUM 9.0 8.7* 8.7* 8.6* 9.1   GFR Estimated Creatinine Clearance: 16.4 mL/min (A) (by C-G formula based on SCr of 3.72 mg/dL (H)). Liver Function Tests: Recent Labs  Lab  04/04/21 0505 04/05/21 0516 04/06/21 0453 04/07/21 0441  AST 16 14* 11*  11*  ALT 12 9 10 8   ALKPHOS 99 86 84 82  BILITOT 0.4 0.4 0.4 0.3  PROT 7.2 6.3* 6.6 6.5  ALBUMIN 3.8 3.3* 3.5 3.6   No results for input(s): LIPASE, AMYLASE in the last 168 hours. No results for input(s): AMMONIA in the last 168 hours. Coagulation profile No results for input(s): INR, PROTIME in the last 168 hours.  CBC: Recent Labs  Lab 04/03/21 1609 04/04/21 0505 04/05/21 0516 04/06/21 0453 04/07/21 0441  WBC 3.7* 5.0 4.0 4.6 5.1  NEUTROABS 1.9 3.8 1.9 3.0 3.3  HGB 10.4* 10.1* 9.1* 8.6* 8.9*  HCT 32.4* 32.1* 29.8* 28.6* 29.0*  MCV 89.0 89.9 92.8 93.5 92.9  PLT 262 255 188 244 240   Cardiac Enzymes: No results for input(s): CKTOTAL, CKMB, CKMBINDEX, TROPONINI in the last 168 hours. BNP: Invalid input(s): POCBNP CBG: Recent Labs  Lab 04/03/21 1837 04/03/21 2017 04/03/21 2058 04/03/21 2137  GLUCAP 231* 41* 127* 121*   D-Dimer No results for input(s): DDIMER in the last 72 hours. Hgb A1c No results for input(s): HGBA1C in the last 72 hours. Lipid Profile No results for input(s): CHOL, HDL, LDLCALC, TRIG, CHOLHDL, LDLDIRECT in the last 72 hours. Thyroid function studies No results for input(s): TSH, T4TOTAL, T3FREE, THYROIDAB in the last 72 hours.  Invalid input(s): FREET3 Anemia work up No results for input(s): VITAMINB12, FOLATE, FERRITIN, TIBC, IRON, RETICCTPCT in the last 72 hours. Microbiology Recent Results (from the past 240 hour(s))  Resp Panel by RT-PCR (Flu A&B, Covid) Nasopharyngeal Swab     Status: Abnormal   Collection Time: 04/03/21  6:34 PM   Specimen: Nasopharyngeal Swab; Nasopharyngeal(NP) swabs in vial transport medium  Result Value Ref Range Status   SARS Coronavirus 2 by RT PCR POSITIVE (A) NEGATIVE Final    Comment: RESULT CALLED TO, READ BACK BY AND VERIFIED WITH: LISA ADKINS RN AT 1939 ON 04/03/21 BY I.[email or web address] (NOTE) SARS-CoV-2 target nucleic acids are  DETECTED.  The SARS-CoV-2 RNA is generally detectable in upper respiratory specimens during the acute phase of infection. Positive results are indicative of the presence of the identified virus, but do not rule out bacterial infection or co-infection with other pathogens not detected by the test. Clinical correlation with patient history and other diagnostic information is necessary to determine patient infection status. The expected result is Negative.  Fact Sheet for Patients: EntrepreneurPulse.com.au  Fact Sheet for Healthcare Providers: IncredibleEmployment.be  This test is not yet approved or cleared by the [place] FDA and  has been authorized for detection and/or diagnosis of SARS-CoV-2 by FDA under an Emergency Use Authorization (EUA).  This EUA will remain in effect (meaning this  test can be used) for the duration of  the COVID-19 declaration under Section 564(b)(1) of the Act, 21 U.S.C. section 360bbb-3(b)(1), unless the authorization is terminated or revoked sooner.     Influenza A by PCR NEGATIVE NEGATIVE Final   Influenza B by PCR NEGATIVE NEGATIVE Final    Comment: (NOTE) The Xpert Xpress SARS-CoV-2/FLU/RSV plus assay is intended as an aid in the diagnosis of influenza from Nasopharyngeal swab specimens and should not be used as a sole basis for treatment. Nasal washings and aspirates are unacceptable for Xpert Xpress SARS-CoV-2/FLU/RSV testing.  Fact Sheet for Patients: EntrepreneurPulse.com.au  Fact Sheet for Healthcare Providers: IncredibleEmployment.be  This test is not yet approved or cleared by the [place] FDA and has been authorized for detection and/or diagnosis of SARS-CoV-2 by FDA under an Emergency Use Authorization (EUA).  This EUA will remain in effect (meaning this test can be used) for the duration of the COVID-19 declaration under Section 564(b)(1) of the Act, 21  U.S.C. section 360bbb-3(b)(1), unless the authorization is terminated or revoked.  Performed at [facility], [address] [place], [place] [address]      Signed: Terrilee Croak  [facility] 04/07/2021, 11:56 AM

## 2021-04-07 NOTE — Progress Notes (Signed)
Bilateral upper extremity vein mapping has been completed. Preliminary results can be found in CV Proc through chart review.  Results were given to the patient's nurse, [place].  04/07/21 9:59 AM Sherry [name] RVT

## 2021-04-07 NOTE — Progress Notes (Signed)
   04/07/21 0836  Assess: MEWS Score  Temp 98.6 F (37 C)  BP (!) 220/98  Pulse Rate 70  Resp 18  SpO2 100 %  O2 Device Room Air  Assess: MEWS Score  MEWS Temp 0  MEWS Systolic 2  MEWS Pulse 0  MEWS RR 0  MEWS LOC 0  MEWS Score 2  MEWS Score Color Yellow  Assess: if the MEWS score is Yellow or Red  Were vital signs taken at a resting state? Yes  Focused Assessment Change from prior assessment (see assessment flowsheet)  Does the patient meet 2 or more of the SIRS criteria? No  MEWS guidelines implemented *See Row Information* Yes  Treat  MEWS Interventions Administered scheduled meds/treatments  Pain Scale 0-10  Pain Score 8  Pain Type Chronic pain  Pain Location Back  Pain Orientation Lower  Pain Descriptors / Indicators Aching;Discomfort  Pain Frequency Constant  Pain Onset On-going  Patients Stated Pain Goal 2  Pain Intervention(s) Medication (See eMAR)  Complains of Nausea /  Vomiting  Nausea relieved by Antiemetic  Patients response to intervention Effective  Take Vital Signs  Increase Vital Sign Frequency  Yellow: Q 2hr X 2 then Q 4hr X 2, if remains yellow, continue Q 4hrs  Escalate  MEWS: Escalate Yellow: discuss with charge nurse/RN and consider discussing with provider and RRT  Notify: Charge Nurse/RN  Name of Charge Nurse/RN Notified Barbara,RN  Date Charge Nurse/RN Notified 04/07/21  Time Charge Nurse/RN Notified 0900  Notify: Provider  Provider Name/Title [place],MD  Date Provider Notified 04/07/21  Time Provider Notified [phone]  Notification Type Page  Notification Reason Other (Comment) (BP high)  Provider response No new orders  Date of Provider Response 04/07/21  Time of Provider Response 0913  Document  Patient Outcome Other (Comment) (status pending)  Assess: SIRS CRITERIA  SIRS Temperature  0  SIRS Pulse 0  SIRS Respirations  0  SIRS WBC 0  SIRS Score Sum  0  Pt is alert and oriented. Pt is started on yellow MEWS. Will continue monitor  the pt

## 2021-04-07 NOTE — Progress Notes (Signed)
PT left the unit, pt transportation was provided by the hospital. Pt last vitals were stable and in the chart. Pt was explained the discharge instruction and no concerns or question by the pt.

## 2021-04-16 ENCOUNTER — Encounter (HOSPITAL_COMMUNITY): Payer: Medicare HMO

## 2021-04-25 ENCOUNTER — Ambulatory Visit (HOSPITAL_COMMUNITY)
Admission: RE | Admit: 2021-04-25 | Discharge: 2021-04-25 | Disposition: A | Discharge status: Home / Self Care | Payer: Medicare HMO | Source: Ambulatory Visit | Attending: Vascular Surgery | Admitting: Vascular Surgery

## 2021-04-25 ENCOUNTER — Ambulatory Visit (INDEPENDENT_AMBULATORY_CARE_PROVIDER_SITE_OTHER): Payer: Medicare HMO | Admitting: Vascular Surgery

## 2021-04-25 ENCOUNTER — Other Ambulatory Visit: Payer: Self-pay

## 2021-04-25 ENCOUNTER — Encounter: Payer: Self-pay | Admitting: Vascular Surgery

## 2021-04-25 VITALS — BP 161/70 | HR 59 | Temp 97.2°F | Resp 14 | Ht 62.0 in | Wt 210.0 lb

## 2021-04-25 DIAGNOSIS — N184 Chronic kidney disease, stage 4 (severe): Secondary | ICD-10-CM

## 2021-04-25 NOTE — Progress Notes (Signed)
Office Note     CC:  ESRD Requesting Provider:  Rosita Fire, *  HPI: Sherry [name] is a Right handed 65 y.o. (May 20, 1956) female with kidney disease who presents at the request of Rosita Fire, * for permanent HD access. Pt has had no prior access procedures.  Tunnel lines, no current access.  On exam, [name] was doing well.  She is aware of her chronic kidney disease that is deteriorating to end-stage renal disease.  She denied any previous history of upper extremity effort-induced pain, rest pain, ulceration.  The pt is not on a statin for cholesterol management.  The pt is not on a daily aspirin.   Other AC:  - The pt is  on medications for hypertension.   The pt is not diabetic. Tobacco hx:  former  Past Medical History:  Diagnosis Date   Alcohol abuse 05/09/2019   Alcohol use disorder, severe, dependence ([place]) 08/27/2017   Alcohol withdrawal ([place]) 08/09/2017   Anxiety    ARF (acute renal failure) ([place]) 04/09/2020   Arthropathy of lumbar facet joint 05/24/2020   Bilateral low back pain with bilateral sciatica 01/05/2019   Bipolar and related disorder ([place])    Blood transfusion without reported diagnosis    Body mass index (BMI) 40.0-44.9, adult ([place]) 07/19/2020   Breast cancer ([place])    right lumpectemy and lymph node    Cancer ([place]) 09/06/2017   Cervical spondylosis 05/24/2020   Chest pain 05/31/2020   Chest pain, rule out acute myocardial infarction 05/29/2020   CHF (congestive heart failure) (HCC)    Chronic back pain    Chronic bilateral low back pain with right-sided sciatica 05/24/2020   Chronic diastolic CHF (congestive heart failure) ([place]) 05/09/2019   Chronic kidney disease    "Stage IV" - states due to hypertension   Chronic kidney disease, stage III (moderate) ([place]) 11/17/2016   Chronic low back pain 09/06/2017   Disc levels:      No abnormality at L2-3 or above.      L3-4:  Mild bulging of the disc.  No stenosis.      L4-5: Bilateral facet arthropathy  with gaping, fluid-filled joints.   Joint edema. Anterolisthesis would likely occur at this level with   standing and flexion. Disc degeneration with mild bulging of the   disc. Mild narrowing of the lateral recesses and foramina, which   would likely worsen   Cigarette smoker 12/31/2017   CKD (chronic kidney disease) stage 4, GFR 15-29 ml/min (HCC) 04/18/2020   Clonus 05/24/2020   Cyst of ovary    Depression    Depression with anxiety 08/21/2016   Dizziness 02/24/2018   DOE (dyspnea on exertion) 12/30/2017   12/30/2017   Walked RA x one lap @ 185 stopped due to  Sob/ lightheaded weak with sats 96% at end - Spirometry 12/30/2017  FEV1 1.27 (68%)  Ratio 99 s curvature / abn effort dep portion only     Dyspnea    Dyspnea and respiratory abnormality 02/13/2013   Formatting of this note might be different from the original. STORY: PSG on 05/20/12 showed no OSA but upper airway resistance syndrome, AHI 3.9, severe snoring was noted. ESS=8. UARS. Advise positional therapy, weight loss program, regular exercise.   Essential hypertension 07/06/2017   GERD (gastroesophageal reflux disease)    Grief reaction with prolonged bereavement 09/06/2017   Partner of 18 yrs left 2018-pt admits she still loves her   Hepatitis C    recieved treatment  for Hep C   Hepatitis C virus infection cured after antiviral drug therapy 12/17/2012   Telephone Encounter - Dub Mikes, RN - 12/28/2016 9:54 AM EDT Patient called for HCV RNA test results. EOT 08-26-16 - not detected. 12 week post treatment 12-11-16 - not detected. Informed patient that she was cured. Patient verbalized understanding.          History of breast cancer 02/13/2013   Hx of bipolar disorder 01/31/2016   Hypertension    Hypertensive emergency 10/21/2017   Impaired functional mobility, balance, gait, and endurance 01/09/2021   Left-sided weakness    MDD (major depressive disorder), recurrent severe, without psychosis ([place])    Medication intolerance 09/06/2017    Remeron-appetite stimulant/Dizziness/Dysphoria   Microcytic anemia 10/21/2017   Morbid obesity due to excess calories ([place]) 02/13/2013   BMI 42.9   MRSA infection    of breast incision   Nausea 04/09/2020   Formatting of this note might be different from the original. Added automatically from request for surgery [id number]   Near syncope 05/29/2020   Neck pain 05/24/2020   Neuroleptic-induced tardive dyskinesia 09/06/2017   Abilify   Opioid dependence ([place])    Has been [facility] in [facility] in past   Opioid use disorder, severe, dependence ([place]) 08/27/2017   Oral aphthous ulcer 06/25/2015   Last Assessment & Plan:  Formatting of this note might be different from the original. - Appear to be resolving as a be expected in the case of aphthous stomatitis. - Continue topical therapy with viscous lidocaine.  Recommended trying high-dose anti-inflammatory such as 600 mg of ibuprofen as needed. - She'll return to ENT clinic if symptoms do not resolve.   Phlebitis after infusion 10/28/2017   Rt antecubital fossa   Pre-operative cardiovascular examination 06/13/2018   Restless legs syndrome 02/13/2013   Formatting of this note might be different from the original. IMPRESSION: Possible. Will follow.   Spondylolisthesis, lumbar region 10/21/2020   Subacute dyskinesia due to drug 02/13/2013   Formatting of this note might be different from the original. STORY: Tardive dyskinesia and mild limb dyskinesia, LE>UE which likely due to prolonged antipsychotic used, abilify. Couldn't tolerate artane, gabapentin, clonazepam and xenazine.`E1o3L`IMPRESSION: Well tolerated depakote 250mg  bid, mood aspect is better as well. Will increase to 500mg  bid. ADR was discussed.  RTC 4-6 weeks.   Substance abuse ([place])    Substance induced mood disorder ([place]) 08/13/2017   TIA (transient ischemic attack) 2020   P/w BP >230/120 and neurologic symptoms, presumed TIA    Past Surgical History:  Procedure Laterality Date   BACK SURGERY   1990   BREAST SURGERY Right 2010   "breast cancer survivor" - states partial mastectomy and nodes   COLONOSCOPY     [facility]   ESOPHAGOGASTRODUODENOSCOPY     [facility]   ESOPHAGOGASTRODUODENOSCOPY  04/18/2020   [facility]   LAPAROSCOPIC CHOLECYSTECTOMY  2002   LAPAROSCOPIC INCISIONAL / UMBILICAL / [facility]  04/13/2018   with BARD 15x 25ZD mesh (supraumbilical)   LAPAROSCOPIC LYSIS OF ADHESIONS  07/12/2018   Procedure: LAPAROSCOPIC LYSIS OF ADHESIONS;  Surgeon: Isabel Caprice, MD;  Location: [facility];  Service: Gynecology;;   MULTIPLE TOOTH EXTRACTIONS     MYOMECTOMY     x 2 prior to hysterectomy   ROBOTIC ASSISTED BILATERAL SALPINGO OOPHERECTOMY Right 07/12/2018   Procedure: XI ROBOTIC ASSISTED RIGHT SALPINGO OOPHORECTOMY;  Surgeon: Isabel Caprice, MD;  Location: [facility];  Service: Gynecology;  Laterality:  Right;   TOTAL ABDOMINAL HYSTERECTOMY     fibroids    UPPER GASTROINTESTINAL ENDOSCOPY     WISDOM TOOTH EXTRACTION      Social History   Socioeconomic History   Marital status: Single    Spouse name: Not on file   Number of children: 0   Years of education: 12   Highest education level: High school graduate  Occupational History   Occupation: Disabled  Tobacco Use   Smoking status: Every Day    Packs/day: 1.00    Types: Cigarettes   Smokeless tobacco: Never  Vaping Use   Vaping Use: Former   Start date: 06/27/2013   Quit date: 06/14/2017   Devices: Apple Cinnamon-0 mg  Substance and Sexual Activity   Alcohol use: Not Currently    Comment: hx over 30 years   Drug use: Not Currently    Comment: h/o IVD use   Sexual activity: Not Currently  Other Topics Concern   Not on file  Social History Narrative   Lives at home alone.   Right-handed.   Caffeine use: 4 cups coffee/soda   Social Determinants of Health   Financial Resource Strain: Not on file  Food Insecurity: Not on file  Transportation Needs: Not on file  Physical  Activity: Not on file  Stress: Not on file  Social Connections: Not on file  Intimate Partner Violence: Not on file    Family History  Problem Relation Age of Onset   Heart attack Mother    Breast cancer Mother 66   Dementia Mother    Cancer Father 33       died of bleeding from kidneys   Colon cancer Neg Hx    Esophageal cancer Neg Hx    Stomach cancer Neg Hx    Rectal cancer Neg Hx     Current Outpatient Medications  Medication Sig Dispense Refill   amLODipine (NORVASC) 10 MG tablet Take 10 mg by mouth daily.     Cholecalciferol (VITAMIN D3) 50 MCG (2000 UT) capsule Take 1 capsule (2,000 Units total) by mouth daily. (Patient not taking: Reported on 04/25/2021) 30 capsule 0   ferrous sulfate 325 (65 FE) MG tablet Take 1 tablet by mouth daily with breakfast.     FLUoxetine (PROZAC) 40 MG capsule Take 40 mg by mouth in the morning.     furosemide (LASIX) 20 MG tablet Take 20 mg by mouth daily. (Patient not taking: Reported on 04/25/2021)     gabapentin (NEURONTIN) 800 MG tablet Take 800 mg by mouth 3 (three) times daily.     hydrALAZINE (APRESOLINE) 100 MG tablet Take 100 mg by mouth 3 (three) times daily.     INGREZZA 80 MG CAPS Take 80 mg by mouth at bedtime.     isosorbide mononitrate (IMDUR) 60 MG 24 hr tablet Take 60 mg by mouth daily.     labetalol (NORMODYNE) 300 MG tablet Take 300 mg by mouth 2 (two) times daily.     melatonin 5 MG TABS Take 5 mg by mouth at bedtime as needed.     oxyCODONE-acetaminophen (PERCOCET) 10-325 MG tablet Take 1 tablet by mouth 2 (two) times daily as needed.     pantoprazole (PROTONIX) 20 MG tablet TAKE 1 TABLET(20 MG) BY MOUTH TWICE DAILY 60 tablet 0   sodium bicarbonate 650 MG tablet Take 650 mg by mouth 2 (two) times daily. (Patient not taking: Reported on 04/25/2021)     No current facility-administered medications for this visit.  Allergies  Allergen Reactions   Abilify [Aripiprazole] Other (See Comments)    Tardive dyskinesia Oral    Remeron [Mirtazapine] Other (See Comments)    Wgt stimulation /gain, Dizziness, Patient says "can tolerate"   Trazodone And Nefazodone Other (See Comments)    Nightmares/sleep diturbance   Flexeril [Cyclobenzaprine] Other (See Comments)    Pt states Flexeril makes her feel depressed    Amoxicillin Diarrhea and Other (See Comments)    NOTE the patient has had PCN WITHOUT reaction Has patient had a PCN reaction causing immediate rash, facial/tongue/throat swelling, SOB or lightheadedness with hypotension: No Has patient had a PCN reaction causing severe rash involving mucus membranes or skin necrosis: No Has patient had a PCN reaction that required hospitalization: No Has patient had a PCN reaction occurring within the last 10 years: No If all of the above answers are "NO", then may proceed with Cephalosporin use.      REVIEW OF SYSTEMS:   [X]  denotes positive finding, [ ]  denotes negative finding Cardiac  Comments:  Chest pain or chest pressure:    Shortness of breath upon exertion:    Short of breath when lying flat:    Irregular heart rhythm:        Vascular    Pain in calf, thigh, or hip brought on by ambulation:    Pain in feet at night that wakes you up from your sleep:     Blood clot in your veins:    Leg swelling:         Pulmonary    Oxygen at home:    Productive cough:     Wheezing:         Neurologic    Sudden weakness in arms or legs:     Sudden numbness in arms or legs:     Sudden onset of difficulty speaking or slurred speech:    Temporary loss of vision in one eye:     Problems with dizziness:         Gastrointestinal    Blood in stool:     Vomited blood:         Genitourinary    Burning when urinating:     Blood in urine:        Psychiatric    Major depression:         Hematologic    Bleeding problems:    Problems with blood clotting too easily:        Skin    Rashes or ulcers:        Constitutional    Fever or chills:      PHYSICAL  EXAMINATION:  Vitals:   04/25/21 1159  BP: (!) 161/70  Pulse: (!) 59  Resp: 14  Temp: (!) 97.2 F (36.2 C)  TempSrc: Temporal  SpO2: 94%  Weight: 210 lb (95.3 kg)  Height: 5\' 2"  (1.575 m)    General:  WDWN in NAD; vital signs documented above Gait: Not observed HENT: WNL, normocephalic Pulmonary: normal non-labored breathing , without Rales, rhonchi,  wheezing Cardiac: regular HR,  Abdomen: soft, NT, no masses Skin: without rashes Vascular Exam/Pulses:  Right Left  Radial 2+ (normal) 2+ (normal)  Ulnar 2+ (normal) 2+ (normal)                   Extremities: without ischemic changes, without Gangrene , without cellulitis; without open wounds;  Musculoskeletal: no muscle wasting or atrophy  Neurologic: A&O X 3;  No focal weakness or  paresthesias are detected Psychiatric:  The pt has Normal affect.   Non-Invasive Vascular Imaging:   Noninvasive vascular imaging was independently reviewed demonstrating adequate vein diameters in bilateral cephalic and basilic veins.  The left cephalic vein demonstrated thrombus in the brachiocephalic portion   ASSESSMENT/PLAN:  Carmelle Bamberg is a 65 y.o. female who presents with stage IV chronic kidney disease in need of permanent HD access.  Gal has diameters suitable for AV fistula creation in bilateral upper extremities, however the left cephalic is noted to have thrombus on her 04/07/2021 ultrasound.  I placed an ultrasound on the arm during our visit and the thrombus was still present.   The best access for Esmae at this time is right-sided brachiocephalic fistula.  There is some branching at the antecubital fossa, however this did not appear to be severe. Cephalic veins bilaterally appeared deep, which may necessitate superficialization.  Jeny is aware of this.  After discussing the risk and benefits of right-sided AV fistula creation, [name] elected to proceed. She was asked to start a baby aspirin.  I asked her to also discuss starting a  statin medication with her primary care.  Tatiyana was booked for surgery on 05/01/2021.  Broadus John, MD [facility] [phone]

## 2021-04-29 ENCOUNTER — Other Ambulatory Visit: Payer: Self-pay

## 2021-04-29 ENCOUNTER — Encounter (HOSPITAL_BASED_OUTPATIENT_CLINIC_OR_DEPARTMENT_OTHER): Payer: Self-pay

## 2021-04-29 ENCOUNTER — Emergency Department (HOSPITAL_BASED_OUTPATIENT_CLINIC_OR_DEPARTMENT_OTHER): Payer: Medicare HMO

## 2021-04-29 ENCOUNTER — Emergency Department (HOSPITAL_BASED_OUTPATIENT_CLINIC_OR_DEPARTMENT_OTHER)
Admission: EM | Admit: 2021-04-29 | Discharge: 2021-04-29 | Disposition: A | Discharge status: Home / Self Care | Payer: Medicare HMO | Attending: Emergency Medicine | Admitting: Emergency Medicine

## 2021-04-29 DIAGNOSIS — Z8616 Personal history of COVID-19: Secondary | ICD-10-CM | POA: Diagnosis not present

## 2021-04-29 DIAGNOSIS — N184 Chronic kidney disease, stage 4 (severe): Secondary | ICD-10-CM | POA: Insufficient documentation

## 2021-04-29 DIAGNOSIS — Z79899 Other long term (current) drug therapy: Secondary | ICD-10-CM | POA: Diagnosis not present

## 2021-04-29 DIAGNOSIS — F17210 Nicotine dependence, cigarettes, uncomplicated: Secondary | ICD-10-CM | POA: Insufficient documentation

## 2021-04-29 DIAGNOSIS — I130 Hypertensive heart and chronic kidney disease with heart failure and stage 1 through stage 4 chronic kidney disease, or unspecified chronic kidney disease: Secondary | ICD-10-CM | POA: Diagnosis not present

## 2021-04-29 DIAGNOSIS — I5032 Chronic diastolic (congestive) heart failure: Secondary | ICD-10-CM | POA: Diagnosis not present

## 2021-04-29 DIAGNOSIS — R0602 Shortness of breath: Secondary | ICD-10-CM | POA: Diagnosis not present

## 2021-04-29 DIAGNOSIS — Z853 Personal history of malignant neoplasm of breast: Secondary | ICD-10-CM | POA: Insufficient documentation

## 2021-04-29 DIAGNOSIS — N189 Chronic kidney disease, unspecified: Secondary | ICD-10-CM

## 2021-04-29 DIAGNOSIS — E875 Hyperkalemia: Secondary | ICD-10-CM

## 2021-04-29 LAB — CBC WITH DIFFERENTIAL/PLATELET
Abs Immature Granulocytes: 0.02 10*3/uL (ref 0.00–0.07)
Basophils Absolute: 0 10*3/uL (ref 0.0–0.1)
Basophils Relative: 0 %
Eosinophils Absolute: 0.1 10*3/uL (ref 0.0–0.5)
Eosinophils Relative: 2 %
HCT: 28 % — ABNORMAL LOW (ref 36.0–46.0)
Hemoglobin: 8.8 g/dL — ABNORMAL LOW (ref 12.0–15.0)
Immature Granulocytes: 0 %
Lymphocytes Relative: 16 %
Lymphs Abs: 1 10*3/uL (ref 0.7–4.0)
MCH: 28.9 pg (ref 26.0–34.0)
MCHC: 31.4 g/dL (ref 30.0–36.0)
MCV: 92.1 fL (ref 80.0–100.0)
Monocytes Absolute: 0.4 10*3/uL (ref 0.1–1.0)
Monocytes Relative: 6 %
Neutro Abs: 4.6 10*3/uL (ref 1.7–7.7)
Neutrophils Relative %: 76 %
Platelets: 244 10*3/uL (ref 150–400)
RBC: 3.04 MIL/uL — ABNORMAL LOW (ref 3.87–5.11)
RDW: 16.8 % — ABNORMAL HIGH (ref 11.5–15.5)
WBC: 6.1 10*3/uL (ref 4.0–10.5)
nRBC: 0 % (ref 0.0–0.2)

## 2021-04-29 LAB — COMPREHENSIVE METABOLIC PANEL
ALT: 8 U/L (ref 0–44)
AST: 11 U/L — ABNORMAL LOW (ref 15–41)
Albumin: 3.5 g/dL (ref 3.5–5.0)
Alkaline Phosphatase: 89 U/L (ref 38–126)
Anion gap: 8 (ref 5–15)
BUN: 49 mg/dL — ABNORMAL HIGH (ref 8–23)
CO2: 18 mmol/L — ABNORMAL LOW (ref 22–32)
Calcium: 8.9 mg/dL (ref 8.9–10.3)
Chloride: 111 mmol/L (ref 98–111)
Creatinine, Ser: 4.3 mg/dL — ABNORMAL HIGH (ref 0.44–1.00)
GFR, Estimated: 11 mL/min — ABNORMAL LOW (ref >60)
Glucose, Bld: 95 mg/dL (ref 70–99)
Potassium: 5.3 mmol/L — ABNORMAL HIGH (ref 3.5–5.1)
Sodium: 137 mmol/L (ref 135–145)
Total Bilirubin: 0.3 mg/dL (ref 0.3–1.2)
Total Protein: 7.1 g/dL (ref 6.5–8.1)

## 2021-04-29 LAB — BRAIN NATRIURETIC PEPTIDE: B Natriuretic Peptide: 417.1 pg/mL — ABNORMAL HIGH (ref 0.0–100.0)

## 2021-04-29 LAB — TROPONIN I (HIGH SENSITIVITY)
Troponin I (High Sensitivity): 8 ng/L (ref <18)
Troponin I (High Sensitivity): 8 ng/L (ref <18)

## 2021-04-29 MED ORDER — FUROSEMIDE 10 MG/ML IJ SOLN: 20.0000 mg | Freq: Once | INTRAMUSCULAR | Status: DC

## 2021-04-29 MED ORDER — AMLODIPINE BESYLATE 5 MG PO TABS: 10.0000 mg | ORAL_TABLET | Freq: Once | ORAL | Status: DC

## 2021-04-29 MED ORDER — HYDROMORPHONE HCL 1 MG/ML IJ SOLN
1.0000 mg | Freq: Once | INTRAMUSCULAR | Status: AC
Start: 2021-04-29 — End: 2021-04-29
  Administered 2021-04-29: 1 mg via INTRAVENOUS
  Filled 2021-04-29: qty 1

## 2021-04-29 NOTE — Discharge Instructions (Signed)
Your kidney function is slightly elevated from your baseline and your potassium is slightly elevated.  I consulted the nephrologist, they recommend that you follow-up at your scheduled appointment tomorrow for repeat blood work.  They want to proceed with your vascular procedure.

## 2021-04-29 NOTE — ED Provider Notes (Addendum)
[facility] EMERGENCY DEPARTMENT Provider Note   CSN: [id number] Arrival date & time: 04/29/21  1030     History Chief Complaint  Patient presents with   Shortness of Breath    Sherry [name] is a 65 y.o. female.  HPI  65 year old female with past medical history of CKD, CHF, HTN, HLD, substance abuse, tardive dyskinesia presents to the emergency department for concern of shortness of breath.  Patient states she has been feeling more short of breath than baseline, specifically with exertion.  She denies any associated chest pain or back pain.  She has a chronic cough at baseline and does not feel like this is acutely changed.  Denies any swelling in her lower extremities.  Patient states that she continues to have intermittent shaking that has been diagnosed as tardive dyskinesia.  Denies syncope, abdominal pain, vomiting/diarrhea.  Does endorse decreased appetite and fatigue.  Past Medical History:  Diagnosis Date   Alcohol abuse 05/09/2019   Alcohol use disorder, severe, dependence ([place]) 08/27/2017   Alcohol withdrawal ([place]) 08/09/2017   Anxiety    ARF (acute renal failure) ([place]) 04/09/2020   Arthropathy of lumbar facet joint 05/24/2020   Bilateral low back pain with bilateral sciatica 01/05/2019   Bipolar and related disorder ([place])    Blood transfusion without reported diagnosis    Body mass index (BMI) 40.0-44.9, adult ([place]) 07/19/2020   Breast cancer ([place])    right lumpectemy and lymph node    Cancer ([place]) 09/06/2017   Cervical spondylosis 05/24/2020   Chest pain 05/31/2020   Chest pain, rule out acute myocardial infarction 05/29/2020   CHF (congestive heart failure) (HCC)    Chronic back pain    Chronic bilateral low back pain with right-sided sciatica 05/24/2020   Chronic diastolic CHF (congestive heart failure) ([place]) 05/09/2019   Chronic kidney disease    "Stage IV" - states due to hypertension   Chronic kidney disease, stage III (moderate) ([place]) 11/17/2016    Chronic low back pain 09/06/2017   Disc levels:      No abnormality at L2-3 or above.      L3-4:  Mild bulging of the disc.  No stenosis.      L4-5: Bilateral facet arthropathy with gaping, fluid-filled joints.   Joint edema. Anterolisthesis would likely occur at this level with   standing and flexion. Disc degeneration with mild bulging of the   disc. Mild narrowing of the lateral recesses and foramina, which   would likely worsen   Cigarette smoker 12/31/2017   CKD (chronic kidney disease) stage 4, GFR 15-29 ml/min (HCC) 04/18/2020   Clonus 05/24/2020   Cyst of ovary    Depression    Depression with anxiety 08/21/2016   Dizziness 02/24/2018   DOE (dyspnea on exertion) 12/30/2017   12/30/2017   Walked RA x one lap @ 185 stopped due to  Sob/ lightheaded weak with sats 96% at end - Spirometry 12/30/2017  FEV1 1.27 (68%)  Ratio 99 s curvature / abn effort dep portion only     Dyspnea    Dyspnea and respiratory abnormality 02/13/2013   Formatting of this note might be different from the original. STORY: PSG on 05/20/12 showed no OSA but upper airway resistance syndrome, AHI 3.9, severe snoring was noted. ESS=8. UARS. Advise positional therapy, weight loss program, regular exercise.   Essential hypertension 07/06/2017   GERD (gastroesophageal reflux disease)    Grief reaction with prolonged bereavement 09/06/2017   Partner of 18 yrs left 2018-pt  admits she still loves her   Hepatitis C    recieved treatment for Hep C   Hepatitis C virus infection cured after antiviral drug therapy 12/17/2012   Telephone Encounter - Dub Mikes, RN - 12/28/2016 9:54 AM EDT Patient called for HCV RNA test results. EOT 08-26-16 - not detected. 12 week post treatment 12-11-16 - not detected. Informed patient that she was cured. Patient verbalized understanding.          History of breast cancer 02/13/2013   Hx of bipolar disorder 01/31/2016   Hypertension    Hypertensive emergency 10/21/2017   Impaired functional mobility, balance,  gait, and endurance 01/09/2021   Left-sided weakness    MDD (major depressive disorder), recurrent severe, without psychosis ([place])    Medication intolerance 09/06/2017   Remeron-appetite stimulant/Dizziness/Dysphoria   Microcytic anemia 10/21/2017   Morbid obesity due to excess calories ([place]) 02/13/2013   BMI 42.9   MRSA infection    of breast incision   Nausea 04/09/2020   Formatting of this note might be different from the original. Added automatically from request for surgery [id number]   Near syncope 05/29/2020   Neck pain 05/24/2020   Neuroleptic-induced tardive dyskinesia 09/06/2017   Abilify   Opioid dependence ([place])    Has been treated in [facility] in past   Opioid use disorder, severe, dependence ([place]) 08/27/2017   Oral aphthous ulcer 06/25/2015   Last Assessment & Plan:  Formatting of this note might be different from the original. - Appear to be resolving as a be expected in the case of aphthous stomatitis. - Continue topical therapy with viscous lidocaine.  Recommended trying high-dose anti-inflammatory such as 600 mg of ibuprofen as needed. - She'll return to ENT clinic if symptoms do not resolve.   Phlebitis after infusion 10/28/2017   Rt antecubital fossa   Pre-operative cardiovascular examination 06/13/2018   Restless legs syndrome 02/13/2013   Formatting of this note might be different from the original. IMPRESSION: Possible. Will follow.   Spondylolisthesis, lumbar region 10/21/2020   Subacute dyskinesia due to drug 02/13/2013   Formatting of this note might be different from the original. STORY: Tardive dyskinesia and mild limb dyskinesia, LE>UE which likely due to prolonged antipsychotic used, abilify. Couldn't tolerate artane, gabapentin, clonazepam and xenazine.`E1o3L`IMPRESSION: Well tolerated depakote 250mg  bid, mood aspect is better as well. Will increase to 500mg  bid. ADR was discussed.  RTC 4-6 weeks.   Substance abuse ([place])    Substance induced mood disorder ([place]) 08/13/2017    TIA (transient ischemic attack) 2020   P/w BP >230/120 and neurologic symptoms, presumed TIA    Patient Active Problem List   Diagnosis Date Noted   COVID-19 virus infection 04/05/2021   Hypertensive urgency 04/04/2021   Tardive dyskinesia 04/04/2021   Hyperkalemia 04/03/2021   Impaired functional mobility, balance, gait, and endurance 01/09/2021   Spondylolisthesis, lumbar region 10/21/2020   Anxiety    Bipolar and related disorder ([place])    Blood transfusion without reported diagnosis    Breast cancer ([place])    CHF (congestive heart failure) ([place])    Chronic back pain    Chronic kidney disease    Depression    Dyspnea    Hepatitis C    MRSA infection    Opioid dependence ([place])    Substance abuse ([place])    Body mass index (BMI) 40.0-44.9, adult ([place]) 07/19/2020   Chest pain 05/31/2020   Near syncope 05/29/2020   Chest pain, rule out acute myocardial infarction 05/29/2020  Cervical spondylosis 05/24/2020   Clonus 05/24/2020   Neck pain 05/24/2020   Chronic bilateral low back pain with right-sided sciatica 05/24/2020   Arthropathy of lumbar facet joint 05/24/2020   CKD (chronic kidney disease) stage 4, GFR 15-29 ml/min (HCC) 04/18/2020   ARF (acute renal failure) ([place]) 04/09/2020   Nausea 04/09/2020   Chronic diastolic CHF (congestive heart failure) ([place]) 05/09/2019   GERD (gastroesophageal reflux disease) 05/09/2019   Alcohol abuse 05/09/2019   Hypertension 05/08/2019   Bilateral low back pain with bilateral sciatica 01/05/2019   TIA (transient ischemic attack) 2020   Cyst of ovary    Pre-operative cardiovascular examination 06/13/2018   Dizziness 02/24/2018   Left-sided weakness 02/24/2018   Cigarette smoker 12/31/2017   DOE (dyspnea on exertion) 12/30/2017   Phlebitis after infusion 10/28/2017   Hypertensive emergency 10/21/2017   Microcytic anemia 10/21/2017   Medication intolerance 09/06/2017   Neuroleptic-induced tardive dyskinesia 09/06/2017   Cancer ([place])  09/06/2017   Chronic low back pain 09/06/2017   Grief reaction with prolonged bereavement 09/06/2017   Opioid use disorder, severe, dependence ([place]) 08/27/2017   Alcohol use disorder, severe, dependence ([place]) 08/27/2017   Substance induced mood disorder ([place]) 08/13/2017   MDD (major depressive disorder), recurrent severe, without psychosis ([place])    Alcohol withdrawal ([place]) 08/09/2017   Essential hypertension 07/06/2017   Chronic kidney disease, stage III (moderate) ([place]) 11/17/2016   Depression with anxiety 08/21/2016   Hx of bipolar disorder 01/31/2016   Oral aphthous ulcer 06/25/2015   History of breast cancer 02/13/2013   Morbid obesity due to excess calories ([place]) 02/13/2013   Restless legs syndrome 02/13/2013   Subacute dyskinesia due to drug 02/13/2013   Dyspnea and respiratory abnormality 02/13/2013   Hepatitis C virus infection cured after antiviral drug therapy 12/17/2012    Past Surgical History:  Procedure Laterality Date   BACK SURGERY  1990   BREAST SURGERY Right 2010   "breast cancer survivor" - states partial mastectomy and nodes   COLONOSCOPY     [facility]   ESOPHAGOGASTRODUODENOSCOPY     [facility]   ESOPHAGOGASTRODUODENOSCOPY  04/18/2020   [facility]   LAPAROSCOPIC CHOLECYSTECTOMY  2002   LAPAROSCOPIC INCISIONAL / UMBILICAL / [place] [facility]  04/13/2018   with BARD 15x 53GU mesh (supraumbilical)   LAPAROSCOPIC LYSIS OF ADHESIONS  07/12/2018   Procedure: LAPAROSCOPIC LYSIS OF ADHESIONS;  Surgeon: Isabel Caprice, MD;  Location: [facility];  Service: Gynecology;;   MULTIPLE TOOTH EXTRACTIONS     MYOMECTOMY     x 2 prior to hysterectomy   ROBOTIC ASSISTED BILATERAL SALPINGO OOPHERECTOMY Right 07/12/2018   Procedure: XI ROBOTIC ASSISTED RIGHT SALPINGO OOPHORECTOMY;  Surgeon: Isabel Caprice, MD;  Location: [facility];  Service: Gynecology;  Laterality: Right;   TOTAL ABDOMINAL HYSTERECTOMY     fibroids    UPPER GASTROINTESTINAL ENDOSCOPY      WISDOM TOOTH EXTRACTION       OB History     Gravida  2   Para  0   Term      Preterm      AB  0   Living         SAB      IAB      Ectopic      Multiple      Live Births              Family History  Problem Relation Age of Onset   Heart attack  Mother    Breast cancer Mother 22   Dementia Mother    Cancer Father 19       died of bleeding from kidneys   Colon cancer Neg Hx    Esophageal cancer Neg Hx    Stomach cancer Neg Hx    Rectal cancer Neg Hx     Social History   Tobacco Use   Smoking status: Every Day    Packs/day: 1.00    Types: Cigarettes   Smokeless tobacco: Never  Vaping Use   Vaping Use: Former   Start date: 06/27/2013   Quit date: 06/14/2017   Devices: Apple Cinnamon-0 mg  Substance Use Topics   Alcohol use: Not Currently    Comment: hx over 30 years   Drug use: Not Currently    Comment: h/o IVD use    Home Medications Prior to Admission medications   Medication Sig Start Date End Date Taking? Authorizing Provider  amLODipine (NORVASC) 10 MG tablet Take 10 mg by mouth daily.    [provider]  Cholecalciferol (VITAMIN D3) 50 MCG (2000 UT) capsule Take 1 capsule (2,000 Units total) by mouth daily. Patient not taking: Reported on 04/25/2021 09/26/20   Wendie Agreste, MD  ferrous sulfate 325 (65 FE) MG tablet Take 1 tablet by mouth daily with breakfast.    [provider]  FLUoxetine (PROZAC) 40 MG capsule Take 40 mg by mouth in the morning. 11/09/18   [provider]  furosemide (LASIX) 20 MG tablet Take 20 mg by mouth daily. Patient not taking: Reported on 04/25/2021 07/31/20   [provider]  gabapentin (NEURONTIN) 800 MG tablet Take 800 mg by mouth 3 (three) times daily. 03/24/21   [provider]  hydrALAZINE (APRESOLINE) 100 MG tablet Take 100 mg by mouth 3 (three) times daily. 09/26/20   [provider]  INGREZZA 80 MG CAPS Take 80 mg by mouth at bedtime. 10/15/17    [provider]  isosorbide mononitrate (IMDUR) 60 MG 24 hr tablet Take 60 mg by mouth daily.    [provider]  labetalol (NORMODYNE) 300 MG tablet Take 300 mg by mouth 2 (two) times daily.    [provider]  melatonin 5 MG TABS Take 5 mg by mouth at bedtime as needed.    [provider]  oxyCODONE-acetaminophen (PERCOCET) 10-325 MG tablet Take 1 tablet by mouth 2 (two) times daily as needed. 03/24/21   [provider]  pantoprazole (PROTONIX) 20 MG tablet TAKE 1 TABLET(20 MG) BY MOUTH TWICE DAILY 02/07/21   Wendie Agreste, MD  sodium bicarbonate 650 MG tablet Take 650 mg by mouth 2 (two) times daily. Patient not taking: Reported on 04/25/2021 09/04/20   [provider]    Allergies    Abilify [aripiprazole], Remeron [mirtazapine], Trazodone and nefazodone, Flexeril [cyclobenzaprine], and Amoxicillin  Review of Systems   Review of Systems  Constitutional:  Positive for appetite change and fatigue. Negative for chills and fever.  HENT:  Negative for congestion.   Eyes:  Negative for visual disturbance.  Respiratory:  Positive for shortness of breath. Negative for wheezing.   Cardiovascular:  Negative for chest pain, palpitations and leg swelling.  Gastrointestinal:  Negative for abdominal pain, diarrhea and vomiting.  Genitourinary:  Negative for dysuria and flank pain.  Musculoskeletal:  Negative for back pain.  Skin:  Negative for rash.  Neurological:  Negative for headaches.   Physical Exam Updated Vital Signs BP (!) 187/91   Pulse  67   Temp 98.5 F (36.9 C) (Oral)   Resp 18   Ht 5\' 2"  (1.575 m)   Wt 94.8 kg   SpO2 94%   BMI 38.23 kg/m   Physical Exam Vitals and nursing note reviewed.  Constitutional:      General: She is not in acute distress.    Appearance: Normal appearance. She is not diaphoretic.  HENT:     Head: Normocephalic.     Mouth/Throat:     Mouth: Mucous membranes are moist.  Cardiovascular:      Rate and Rhythm: Normal rate.  Pulmonary:     Effort: Pulmonary effort is normal. No respiratory distress.     Breath sounds: Decreased breath sounds present. No wheezing.  Abdominal:     Palpations: Abdomen is soft.     Tenderness: There is no abdominal tenderness.  Musculoskeletal:     Right lower leg: No edema.     Left lower leg: No edema.  Skin:    General: Skin is warm.  Neurological:     Mental Status: She is alert and oriented to person, place, and time. Mental status is at baseline.  Psychiatric:        Mood and Affect: Mood normal.    ED Results / Procedures / Treatments   Labs (all labs ordered are listed, but only abnormal results are displayed) Labs Reviewed  CBC WITH DIFFERENTIAL/PLATELET  COMPREHENSIVE METABOLIC PANEL  BRAIN NATRIURETIC PEPTIDE  TROPONIN I (HIGH SENSITIVITY)    EKG None  Radiology No results found.  Procedures Procedures   Medications Ordered in ED Medications - No data to display  ED Course  I have reviewed the triage vital signs and the nursing notes.  Pertinent labs & imaging results that were available during my care of the patient were reviewed by me and considered in my medical decision making (see chart for details).    MDM Rules/Calculators/A&P                           65 year old female presents emergency department exertional shortness of breath.  Missed her home dose of blood pressure medications and is hypertensive on arrival but otherwise does not appear acutely fluid overloaded.  Will be given home dose of HTN medications here. No CP. Blood work is reassuring, baseline anemia, troponins are negative x 2 with no delta, chest x-ray is baseline cardiomegaly without acute pulmonary edema.  BNP is slightly elevated from baseline but nonspecific, no other findings of heart failure.  Kidney dysfunction is slightly elevated from baseline with a mild hyperkalemia 5.3.  Consulted with nephrology who recommends dose of Lasix and  for her to follow-up at her appointment tomorrow morning for repeat blood work.  She is scheduled for vascular surgery and AV fistula dialysis later this week.  They want her to proceed with those appointments.  You have been seen and discharged from the emergency department.  Follow-up with your primary provider for reevaluation and further care. Take home medications as prescribed. If you have any worsening symptoms or further concerns for your health please return to an emergency department for further evaluation.   After discharge: It was brought to my attention that the patient was discharged prior to receiving the dose of IV Lasix and oral hypertensive medication.  I instructed staff to stop the patient in the waiting room.  We were able to stop the patient.  I recommended that she come back to  her room to receive this medication as it was recommended by nephrology and myself.  Patient states that the Melburn Popper she ordered is here and that she needs to leave.  Patient understands the purpose of [place] giving the Lasix and the hypertensive medication and the potential risk factors of not receiving his medications including worsening electrolyte dysfunction, worsening hypertension, permanent disability and death.  Patient decided to leave the emergency department waiting room in her room air without receiving medications, she was alert and oriented her entire stay here with capacity to make medical decisions.  Final Clinical Impression(s) / ED Diagnoses Final diagnoses:  None    Rx / DC Orders ED Discharge Orders     None        Lorelle Gibbs, DO 04/29/21 1602    Lorelle Gibbs, DO 04/30/21 1538

## 2021-04-29 NOTE — ED Notes (Signed)
Pt has called an Mining engineer for a ride home.  Pt has refused PO med and IV lasix stating she has to go her ride is here.  States, "I will take my BP meds when I get home" Dr. Dina Rich made aware

## 2021-04-29 NOTE — ED Triage Notes (Signed)
Patient states she has been having increased shortness of breath x 2 days. Patient states she has also been shaking - Patient is afebrile

## 2021-04-29 NOTE — ED Notes (Signed)
Pt requesting Ivs be removed and wants to go home.  Md provider made aware

## 2021-04-30 ENCOUNTER — Other Ambulatory Visit: Payer: Self-pay

## 2021-04-30 ENCOUNTER — Encounter (HOSPITAL_COMMUNITY): Payer: Self-pay | Admitting: Vascular Surgery

## 2021-04-30 NOTE — Progress Notes (Signed)
PCP - Dr. Glendell Docker  Cardiologist - Dr. Geraldo Pitter  EP-Denies  Nephro- Dr. Jonnie Finner  Endocrine- Denies  Pulm-Denies  Chest x-ray - 04/29/21 (E)  EKG - 04/29/21 (E)  Stress Test - 08/06/20 (E)  ECHO - 05/30/20 (E)  Cardiac Cath - Denies  AICD-na PM-na LOOP-na  Dialysis-Denies  Sleep Study - Yes- Negative CPAP - Denies  LABS- 04/03/21 (E): COVID-Positive 04/29/21 (E): CBC, CMP, BNP 05/01/21: I-Stat 8, PCR  ASA-Denies  ERAS-No  HA1C- Denies  Anesthesia- Yes- recent ED visit 04/29/21  Pt denies having chest pain, sob, or fever during the pre-op phone call. All instructions explained to the pt, with a verbal understanding of the material including: as of today, stop taking all Aspirin (unless instructed by your doctor) and Other Aspirin containing products, Vitamins, Fish oils, and Herbal medications. Also stop all NSAIDS i.e. Advil, Ibuprofen, Motrin, Aleve, Anaprox, Naproxen, BC, Goody Powders, and all Supplements. Pt also instructed to wear a mask and social distance if she has to go out prior to her surgery. The opportunity to ask questions was provided.    Coronavirus Screening  Have you experienced the following symptoms:  Cough yes/no: No Fever (>100.34F)  yes/no: No Runny nose yes/no: No Sore throat yes/no: No Difficulty breathing/shortness of breath  yes/no: No  Have you or a family member traveled in the last 14 days and where? yes/no: No   If the patient indicates "YES" to the above questions, their PAT will be rescheduled to limit the exposure to others and, the surgeon will be notified. THE PATIENT WILL NEED TO BE ASYMPTOMATIC FOR 14 DAYS.   If the patient is not experiencing any of these symptoms, the PAT nurse will instruct them to NOT bring anyone with them to their appointment since they may have these symptoms or traveled as well.   Please remind your patients and families that hospital visitation restrictions are in effect and the importance of the  restrictions.

## 2021-04-30 NOTE — Anesthesia Preprocedure Evaluation (Addendum)
Anesthesia Evaluation  Patient identified by MRN, date of birth, ID band Patient awake    Reviewed: Allergy & Precautions, H&P , NPO status , Patient's Chart, lab work & pertinent test results  History of Anesthesia Complications Negative for: history of anesthetic complications  Airway Mallampati: I  TM Distance: >3 FB Neck ROM: Full    Dental  (+) Edentulous Upper, Edentulous Lower   Pulmonary Current Smoker and Patient abstained from smoking.,    Pulmonary exam normal        Cardiovascular hypertension, Pt. on medications and Pt. on home beta blockers +CHF  Normal cardiovascular exam     Neuro/Psych Anxiety Depression TIA   GI/Hepatic negative GI ROS, Neg liver ROS, GERD  ,  Endo/Other  negative endocrine ROS  Renal/GU Renal InsufficiencyRenal disease  negative genitourinary   Musculoskeletal   Abdominal   Peds  Hematology  (+) Blood dyscrasia, anemia ,   Anesthesia Other Findings   Reproductive/Obstetrics negative OB ROS                            Anesthesia Physical  Anesthesia Plan  ASA: 3  Anesthesia Plan: Regional and MAC   Post-op Pain Management:    Induction:   PONV Risk Score and Plan: 2 and Ondansetron and Propofol infusion  Airway Management Planned: Natural Airway  Additional Equipment:   Intra-op Plan:   Post-operative Plan:   Informed Consent:     Dental advisory given  Plan Discussed with: Anesthesiologist and CRNA  Anesthesia Plan Comments: (PAT note by Karoline Caldwell, PA-C: for previous surgery History of HFpEF, uncontrolled hypertension, morbid obesity.  Seen by cardiologist Dr. Geraldo Pitter for preoperative stratification.  He recommended preop Lexiscan as stated that if the test was negative she would not be a high risk for coronary events.  Nuclear stress done 08/06/2020 was nonischemic, low risk, EF 59%.  History of TIA in 2020 with hypertensive  urgency.  Patient is a history of markedly uncontrolled hypertension with CKD 4 as result.  She is followed by nephrologist Dr. Carolin Sicks. Her creatinine and potassium have been quite variable recently, concern for medication and diet compliance. Her case was previously cancelled in February for hyperkalemia and AKI. She did subsequently follow up twice with Dr. Carolin Sicks. On 09/10/20 her potassium was 4.9 and creatinine 2.77. Dr. Carolin Sicks cleared her for surgery per note 09/24/20.  Preop labs 10/17/20 showed potassium 5.7 and creatinine 3.27. I called and spoke with Dr. Carolin Sicks. He recommended recheck labs on DOS. If potassium > 6.0 he advised pt may need admission for optimization prior to surgery. Recommended potassium 5.5 to 5.9 could potentially be treated by anesthesia if felt appropriate.  I discussed case with Dr. Ermalene Postin who agreed labs should be rechecked on DOS and risk/benefits of proceeding will ultimately be addressed by assigned anesthesiologist. I called and relayed all information to Dr. Arnoldo Morale' surgery scheduler.   History of hepatitis C status post treatment with antiviral medications.  History of alcohol abuse and polysubstance abuse, denies currently.  EKG 05/29/2020: Sinus rhythm.  Rate 67. Consider left ventricular hypertrophy No significant change since last tracing  CHEST - 2 VIEW 07/25/2020:  COMPARISON: 05/29/2020  FINDINGS: Stable mild cardiomegaly. No focal airspace consolidation, pleural effusion, or pneumothorax. Cervical ACDF.  IMPRESSION: No active cardiopulmonary disease.  Nuclear stress 08/06/2020: There was no ST segment deviation noted during stress. Nuclear stress EF: 59%. The left ventricular ejection fraction is normal (55-65%). The study  is normal. This is a low risk study.  TTE 05/30/2020: 1. Left ventricular ejection fraction, by estimation, is 60 to 65%. The  left ventricle has normal function. The left ventricle has no regional  wall  motion abnormalities. There is mild left ventricular hypertrophy.  Left ventricular diastolic parameters  are consistent with Grade II diastolic dysfunction (pseudonormalization).  Elevated left atrial pressure.  2. Right ventricular systolic function is normal. The right ventricular  size is normal. Tricuspid regurgitation signal is inadequate for assessing  PA pressure.  3. Left atrial size was mildly dilated.  4. The mitral valve is abnormal. No evidence of mitral valve  regurgitation. Moderate mitral annular calcification.  5. The aortic valve is tricuspid. Aortic valve regurgitation is not  visualized. No aortic stenosis is present.  6. The inferior vena cava is normal in size with greater than 50%  respiratory variability, suggesting right atrial pressure of 3 mmHg.   Carotid ultrasound 05/09/2019: Summary:  Right Carotid: Velocities in the right ICA are consistent with a 1-39%  stenosis.   Left Carotid: Velocities in the left ICA are consistent with a 1-39% stenosis.   Vertebrals: Bilateral vertebral arteries demonstrate antegrade flow.  Subclavians: Normal flow hemodynamics were seen in bilateral subclavian arteries.   )       Anesthesia Quick Evaluation

## 2021-05-01 ENCOUNTER — Other Ambulatory Visit: Payer: Self-pay

## 2021-05-01 ENCOUNTER — Encounter (HOSPITAL_COMMUNITY): Payer: Self-pay | Admitting: Vascular Surgery

## 2021-05-01 ENCOUNTER — Encounter (HOSPITAL_COMMUNITY)
Admission: RE | Disposition: A | Discharge status: Home / Self Care | Payer: Self-pay | Source: Home / Self Care | Attending: Vascular Surgery

## 2021-05-01 ENCOUNTER — Ambulatory Visit (HOSPITAL_COMMUNITY)
Admission: RE | Admit: 2021-05-01 | Discharge: 2021-05-01 | Disposition: A | Discharge status: Home / Self Care | Payer: Medicare HMO | Attending: Vascular Surgery | Admitting: Vascular Surgery

## 2021-05-01 ENCOUNTER — Ambulatory Visit (HOSPITAL_COMMUNITY): Payer: Medicare HMO | Admitting: Physician Assistant

## 2021-05-01 DIAGNOSIS — N184 Chronic kidney disease, stage 4 (severe): Secondary | ICD-10-CM | POA: Diagnosis not present

## 2021-05-01 DIAGNOSIS — Z853 Personal history of malignant neoplasm of breast: Secondary | ICD-10-CM | POA: Diagnosis not present

## 2021-05-01 DIAGNOSIS — I130 Hypertensive heart and chronic kidney disease with heart failure and stage 1 through stage 4 chronic kidney disease, or unspecified chronic kidney disease: Secondary | ICD-10-CM | POA: Insufficient documentation

## 2021-05-01 DIAGNOSIS — Z880 Allergy status to penicillin: Secondary | ICD-10-CM | POA: Diagnosis not present

## 2021-05-01 DIAGNOSIS — Z79899 Other long term (current) drug therapy: Secondary | ICD-10-CM | POA: Insufficient documentation

## 2021-05-01 DIAGNOSIS — I743 Embolism and thrombosis of arteries of the lower extremities: Secondary | ICD-10-CM | POA: Diagnosis not present

## 2021-05-01 DIAGNOSIS — Z888 Allergy status to other drugs, medicaments and biological substances status: Secondary | ICD-10-CM | POA: Insufficient documentation

## 2021-05-01 DIAGNOSIS — I5032 Chronic diastolic (congestive) heart failure: Secondary | ICD-10-CM | POA: Insufficient documentation

## 2021-05-01 HISTORY — PX: AV FISTULA PLACEMENT: SHX1204

## 2021-05-01 LAB — POCT I-STAT, CHEM 8
BUN: 41 mg/dL — ABNORMAL HIGH (ref 8–23)
Calcium, Ion: 1.2 mmol/L (ref 1.15–1.40)
Chloride: 113 mmol/L — ABNORMAL HIGH (ref 98–111)
Creatinine, Ser: 4.6 mg/dL — ABNORMAL HIGH (ref 0.44–1.00)
Glucose, Bld: 103 mg/dL — ABNORMAL HIGH (ref 70–99)
HCT: 28 % — ABNORMAL LOW (ref 36.0–46.0)
Hemoglobin: 9.5 g/dL — ABNORMAL LOW (ref 12.0–15.0)
Potassium: 5.1 mmol/L (ref 3.5–5.1)
Sodium: 140 mmol/L (ref 135–145)
TCO2: 19 mmol/L — ABNORMAL LOW (ref 22–32)

## 2021-05-01 SURGERY — ARTERIOVENOUS (AV) FISTULA CREATION
Anesthesia: Monitor Anesthesia Care | Site: Arm Lower | Laterality: Right

## 2021-05-01 MED ORDER — AMISULPRIDE (ANTIEMETIC) 5 MG/2ML IV SOLN: 10.0000 mg | Freq: Once | INTRAVENOUS | Status: DC | PRN

## 2021-05-01 MED ORDER — HEPARIN 6000 UNIT IRRIGATION SOLUTION
Status: AC
  Filled 2021-05-01: qty 500

## 2021-05-01 MED ORDER — VANCOMYCIN HCL IN DEXTROSE 1-5 GM/200ML-% IV SOLN
1000.0000 mg | INTRAVENOUS | Status: AC
  Administered 2021-05-01: 1000 mg via INTRAVENOUS
  Filled 2021-05-01: qty 200

## 2021-05-01 MED ORDER — SODIUM CHLORIDE 0.9 % IV SOLN: INTRAVENOUS | Status: DC

## 2021-05-01 MED ORDER — FENTANYL CITRATE (PF) 250 MCG/5ML IJ SOLN
INTRAMUSCULAR | Status: AC
  Filled 2021-05-01: qty 5

## 2021-05-01 MED ORDER — LIDOCAINE-EPINEPHRINE (PF) 1 %-1:200000 IJ SOLN
INTRAMUSCULAR | Status: AC
  Filled 2021-05-01: qty 30

## 2021-05-01 MED ORDER — ORAL CARE MOUTH RINSE: 15.0000 mL | Freq: Once | OROMUCOSAL | Status: AC

## 2021-05-01 MED ORDER — FENTANYL CITRATE (PF) 100 MCG/2ML IJ SOLN: 25.0000 ug | INTRAMUSCULAR | Status: DC | PRN

## 2021-05-01 MED ORDER — CHLORHEXIDINE GLUCONATE 4 % EX LIQD: 60.0000 mL | Freq: Once | CUTANEOUS | Status: DC

## 2021-05-01 MED ORDER — PHENYLEPHRINE 40 MCG/ML (10ML) SYRINGE FOR IV PUSH (FOR BLOOD PRESSURE SUPPORT)
PREFILLED_SYRINGE | INTRAVENOUS | Status: DC | PRN
Start: 2021-05-01 — End: 2021-05-01
  Administered 2021-05-01: 80 ug via INTRAVENOUS

## 2021-05-01 MED ORDER — DEXAMETHASONE SODIUM PHOSPHATE 10 MG/ML IJ SOLN
INTRAMUSCULAR | Status: DC | PRN
  Administered 2021-05-01: 5 mg

## 2021-05-01 MED ORDER — CHLORHEXIDINE GLUCONATE 0.12 % MT SOLN: 15.0000 mL | Freq: Once | OROMUCOSAL | Status: AC

## 2021-05-01 MED ORDER — LIDOCAINE HCL (PF) 1 % IJ SOLN
INTRAMUSCULAR | Status: AC
  Filled 2021-05-01: qty 30

## 2021-05-01 MED ORDER — ONDANSETRON HCL 4 MG/2ML IJ SOLN
INTRAMUSCULAR | Status: DC | PRN
  Administered 2021-05-01: 4 mg via INTRAVENOUS

## 2021-05-01 MED ORDER — FENTANYL CITRATE (PF) 250 MCG/5ML IJ SOLN
INTRAMUSCULAR | Status: DC | PRN
  Administered 2021-05-01 (×2): 25 ug via INTRAVENOUS

## 2021-05-01 MED ORDER — PROMETHAZINE HCL 25 MG/ML IJ SOLN: 6.2500 mg | INTRAMUSCULAR | Status: DC | PRN

## 2021-05-01 MED ORDER — MIDAZOLAM HCL 5 MG/5ML IJ SOLN
INTRAMUSCULAR | Status: DC | PRN
  Administered 2021-05-01: 1 mg via INTRAVENOUS

## 2021-05-01 MED ORDER — MIDAZOLAM HCL 2 MG/2ML IJ SOLN
INTRAMUSCULAR | Status: AC
  Filled 2021-05-01: qty 2

## 2021-05-01 MED ORDER — HEPARIN SODIUM (PORCINE) 1000 UNIT/ML IJ SOLN
INTRAMUSCULAR | Status: DC | PRN
  Administered 2021-05-01: 2000 [IU] via INTRAVENOUS

## 2021-05-01 MED ORDER — 0.9 % SODIUM CHLORIDE (POUR BTL) OPTIME
TOPICAL | Status: DC | PRN
  Administered 2021-05-01: 1000 mL

## 2021-05-01 MED ORDER — EPHEDRINE SULFATE-NACL 50-0.9 MG/10ML-% IV SOSY
PREFILLED_SYRINGE | INTRAVENOUS | Status: DC | PRN
  Administered 2021-05-01: 5 mg via INTRAVENOUS
  Administered 2021-05-01: 10 mg via INTRAVENOUS

## 2021-05-01 MED ORDER — PROPOFOL 500 MG/50ML IV EMUL
INTRAVENOUS | Status: DC | PRN
  Administered 2021-05-01: 75 ug/kg/min via INTRAVENOUS

## 2021-05-01 MED ORDER — HEPARIN 6000 UNIT IRRIGATION SOLUTION
Status: DC | PRN
  Administered 2021-05-01: 1

## 2021-05-01 MED ORDER — CHLORHEXIDINE GLUCONATE 0.12 % MT SOLN
OROMUCOSAL | Status: AC
  Administered 2021-05-01: 15 mL via OROMUCOSAL
  Filled 2021-05-01: qty 15

## 2021-05-01 MED ORDER — ACETAMINOPHEN 500 MG PO TABS
1000.0000 mg | ORAL_TABLET | Freq: Once | ORAL | Status: AC
  Administered 2021-05-01: 500 mg via ORAL
  Filled 2021-05-01: qty 2

## 2021-05-01 MED ORDER — LIDOCAINE-EPINEPHRINE (PF) 1.5 %-1:200000 IJ SOLN
INTRAMUSCULAR | Status: DC | PRN
  Administered 2021-05-01: 30 mL via PERINEURAL

## 2021-05-01 SURGICAL SUPPLY — 41 items
ADH SKN CLS APL DERMABOND .7 (GAUZE/BANDAGES/DRESSINGS) ×1
ADH SKN CLS LQ APL DERMABOND (GAUZE/BANDAGES/DRESSINGS) ×1
AGENT HMST SPONGE THK3/8 (HEMOSTASIS)
ARMBAND PINK RESTRICT EXTREMIT (MISCELLANEOUS) ×3 IMPLANT
BLADE CLIPPER SURG (BLADE) ×2 IMPLANT
BNDG ELASTIC 4X5.8 VLCR STR LF (GAUZE/BANDAGES/DRESSINGS) ×1 IMPLANT
CANISTER SUCT 3000ML PPV (MISCELLANEOUS) ×2 IMPLANT
CLIP VESOCCLUDE MED 6/CT (CLIP) ×2 IMPLANT
CLIP VESOCCLUDE SM WIDE 6/CT (CLIP) ×4 IMPLANT
COVER PROBE W GEL 5X96 (DRAPES) ×2 IMPLANT
DECANTER SPIKE VIAL GLASS SM (MISCELLANEOUS) ×1 IMPLANT
DERMABOND ADHESIVE PROPEN (GAUZE/BANDAGES/DRESSINGS) ×1
DERMABOND ADVANCED (GAUZE/BANDAGES/DRESSINGS) ×1
DERMABOND ADVANCED .7 DNX12 (GAUZE/BANDAGES/DRESSINGS) ×1 IMPLANT
DERMABOND ADVANCED .7 DNX6 (GAUZE/BANDAGES/DRESSINGS) IMPLANT
ELECT REM PT RETURN 9FT ADLT (ELECTROSURGICAL) ×2
ELECTRODE REM PT RTRN 9FT ADLT (ELECTROSURGICAL) ×1 IMPLANT
GLOVE SRG 8 PF TXTR STRL LF DI (GLOVE) ×2 IMPLANT
GLOVE SS BIOGEL STRL SZ 6.5 (GLOVE) IMPLANT
GLOVE SUPERSENSE BIOGEL SZ 6.5 (GLOVE) ×2
GLOVE SURG POLYISO LF SZ8 (GLOVE) IMPLANT
GLOVE SURG SYN 6.5 ES PF (GLOVE) ×2 IMPLANT
GLOVE SURG SYN 6.5 PF PI (GLOVE) IMPLANT
GLOVE SURG UNDER POLY LF SZ8 (GLOVE) ×4
GOWN STRL REUS W/ TWL LRG LVL3 (GOWN DISPOSABLE) ×2 IMPLANT
GOWN STRL REUS W/TWL 2XL LVL3 (GOWN DISPOSABLE) ×2 IMPLANT
GOWN STRL REUS W/TWL LRG LVL3 (GOWN DISPOSABLE) ×4
HEMOSTAT SPONGE AVITENE ULTRA (HEMOSTASIS) IMPLANT
KIT BASIN OR (CUSTOM PROCEDURE TRAY) ×2 IMPLANT
KIT TURNOVER KIT B (KITS) ×2 IMPLANT
NS IRRIG 1000ML POUR BTL (IV SOLUTION) ×2 IMPLANT
PACK CV ACCESS (CUSTOM PROCEDURE TRAY) ×2 IMPLANT
PAD ARMBOARD 7.5X6 YLW CONV (MISCELLANEOUS) ×4 IMPLANT
SUT MNCRL AB 4-0 PS2 18 (SUTURE) ×2 IMPLANT
SUT PROLENE 6 0 BV (SUTURE) ×2 IMPLANT
SUT PROLENE 7 0 BV 1 (SUTURE) IMPLANT
SUT VIC AB 3-0 SH 27 (SUTURE) ×2
SUT VIC AB 3-0 SH 27X BRD (SUTURE) ×1 IMPLANT
TOWEL GREEN STERILE (TOWEL DISPOSABLE) ×2 IMPLANT
UNDERPAD 30X36 HEAVY ABSORB (UNDERPADS AND DIAPERS) ×2 IMPLANT
WATER STERILE IRR 1000ML POUR (IV SOLUTION) ×2 IMPLANT

## 2021-05-01 NOTE — Progress Notes (Deleted)
Office Note   Patient is a 65 year old female seen in preoperative holding for long-term HD fistula creation. She was seen in the office last week and doing well.  In the interim, the patient was seen in the ED for shortness of breath.  Work-up demonstrated she had not taken any of her home medications. This is since resolved.  She is doing well this morning without any concerns. No change in physical exam since seen in clinic Imaging demonstrates adequate vein for right brachiocephalic fistula After discussing the risks and benefits of right brachiocephalic fistula creation, [name] elected to proceed.  She is aware, her cephalic vein is deep, and may require superficialization at a later date pending fistula maturation.   CC:  ESRD Requesting Provider:  No ref. provider found  HPI: Betta Balla is a Right handed 65 y.o. (September 05, 1955) female with kidney disease who presents at the request of No ref. provider found for permanent HD access. Pt has had no prior access procedures.  Tunnel lines, no current access.  On exam, Shaneisha was doing well.  She is aware of her chronic kidney disease that is deteriorating to end-stage renal disease.  She denied any previous history of upper extremity effort-induced pain, rest pain, ulceration.  The pt is not on a statin for cholesterol management.  The pt is not on a daily aspirin.   Other AC:  - The pt is  on medications for hypertension.   The pt is not diabetic. Tobacco hx:  former  Past Medical History:  Diagnosis Date   Alcohol abuse 05/09/2019   Alcohol use disorder, severe, dependence ([place]) 08/27/2017   Alcohol withdrawal ([place]) 08/09/2017   Anxiety    ARF (acute renal failure) ([place]) 04/09/2020   Arthropathy of lumbar facet joint 05/24/2020   Bilateral low back pain with bilateral sciatica 01/05/2019   Bipolar and related disorder ([place])    Blood transfusion without reported diagnosis    Body mass index (BMI) 40.0-44.9, adult ([place]) 07/19/2020    Breast cancer ([place])    right lumpectemy and lymph node    Cancer ([place]) 09/06/2017   Cervical spondylosis 05/24/2020   Chest pain 05/31/2020   Chest pain, rule out acute myocardial infarction 05/29/2020   CHF (congestive heart failure) (HCC)    Chronic back pain    Chronic bilateral low back pain with right-sided sciatica 05/24/2020   Chronic diastolic CHF (congestive heart failure) ([place]) 05/09/2019   Chronic kidney disease    "Stage IV" - states due to hypertension   Chronic kidney disease, stage III (moderate) ([place]) 11/17/2016   Chronic low back pain 09/06/2017   Disc levels:      No abnormality at L2-3 or above.      L3-4:  Mild bulging of the disc.  No stenosis.      L4-5: Bilateral facet arthropathy with gaping, fluid-filled joints.   Joint edema. Anterolisthesis would likely occur at this level with   standing and flexion. Disc degeneration with mild bulging of the   disc. Mild narrowing of the lateral recesses and foramina, which   would likely worsen   Cigarette smoker 12/31/2017   CKD (chronic kidney disease) stage 4, GFR 15-29 ml/min (HCC) 04/18/2020   Clonus 05/24/2020   Cyst of ovary    Depression    Depression with anxiety 08/21/2016   Dizziness 02/24/2018   DOE (dyspnea on exertion) 12/30/2017   12/30/2017   Walked RA x one lap @ 185 stopped due to  Sob/ lightheaded weak  with sats 96% at end - Spirometry 12/30/2017  FEV1 1.27 (68%)  Ratio 99 s curvature / abn effort dep portion only     Dyspnea    Dyspnea and respiratory abnormality 02/13/2013   Formatting of this note might be different from the original. STORY: PSG on 05/20/12 showed no OSA but upper airway resistance syndrome, AHI 3.9, severe snoring was noted. ESS=8. UARS. Advise positional therapy, weight loss program, regular exercise.   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 EOT 08-26-16 - not detected. 12 week post treatment 12-11-16 - not detected. Informed patient that she was cured. Patient verbalized understanding.          History of breast cancer 02/13/2013   Hx of bipolar disorder 01/31/2016   Hypertension    Hypertensive emergency 10/21/2017   Impaired functional mobility, balance, gait, and endurance 01/09/2021   Left-sided weakness    MDD (major depressive disorder), recurrent severe, without psychosis ([place])    Medication intolerance 09/06/2017   Remeron-appetite stimulant/Dizziness/Dysphoria   Microcytic anemia 10/21/2017   Morbid obesity due to excess calories ([place]) 02/13/2013   BMI 42.9   MRSA infection    of breast incision   Nausea 04/09/2020   Formatting of this note might be different from the original. Added automatically from request for surgery [id number]   Near syncope 05/29/2020   Neck pain 05/24/2020   Neuroleptic-induced tardive dyskinesia 09/06/2017   Abilify   Opioid dependence ([place])    Has been [facility] in [facility] in past   Opioid use disorder, severe, dependence ([place]) 08/27/2017   Oral aphthous ulcer 06/25/2015   Last Assessment & Plan:  Formatting of this note might be different from the original. - Appear to be resolving as a be expected in the case of aphthous stomatitis. - Continue topical therapy with viscous lidocaine.  Recommended trying high-dose anti-inflammatory such as 600 mg of ibuprofen as needed. - She'll return to ENT clinic if symptoms do not resolve.   Phlebitis after infusion 10/28/2017   Rt antecubital fossa   Pre-operative cardiovascular examination 06/13/2018   Restless legs syndrome 02/13/2013   Formatting of this note might be different from the original. IMPRESSION: Possible. Will follow.    Spondylolisthesis, lumbar region 10/21/2020   Subacute dyskinesia due to drug 02/13/2013   Formatting of this note might be different from the original. STORY: Tardive dyskinesia and mild limb dyskinesia, LE>UE which likely due to prolonged antipsychotic used, abilify. Couldn't tolerate artane, gabapentin, clonazepam and xenazine.`E1o3L`IMPRESSION: Well tolerated depakote 250mg  bid, mood aspect is better as well. Will increase to 500mg  bid. ADR was discussed.  RTC 4-6 weeks.   Substance abuse ([place])    Substance induced mood disorder ([place]) 08/13/2017   TIA (transient ischemic attack) 2020   P/w BP >230/120 and neurologic symptoms, presumed TIA    Past Surgical History:  Procedure Laterality Date   BACK SURGERY  1990   BREAST SURGERY Right 2010   "breast cancer survivor" - states partial mastectomy and nodes   COLONOSCOPY     [facility]   ESOPHAGOGASTRODUODENOSCOPY  [facility]   ESOPHAGOGASTRODUODENOSCOPY  04/18/2020   [facility]   LAPAROSCOPIC CHOLECYSTECTOMY  2002   LAPAROSCOPIC INCISIONAL / UMBILICAL / VENTRAL HERNIA REPAIR  04/13/2018   with BARD 15x 66QH mesh (supraumbilical)   LAPAROSCOPIC LYSIS OF ADHESIONS  07/12/2018   Procedure: LAPAROSCOPIC LYSIS OF ADHESIONS;  Surgeon: Isabel Caprice, MD;  Location: [facility];  Service: Gynecology;;   MULTIPLE TOOTH EXTRACTIONS     MYOMECTOMY     x 2 prior to hysterectomy   ROBOTIC ASSISTED BILATERAL SALPINGO OOPHERECTOMY Right 07/12/2018   Procedure: XI ROBOTIC ASSISTED RIGHT SALPINGO OOPHORECTOMY;  Surgeon: Isabel Caprice, MD;  Location: [facility];  Service: Gynecology;  Laterality: Right;   TOTAL ABDOMINAL HYSTERECTOMY     fibroids    UPPER GASTROINTESTINAL ENDOSCOPY     WISDOM TOOTH EXTRACTION      Social History   Socioeconomic History   Marital status: Single    Spouse name: Not on file   Number of children: 0   Years of education: 12   Highest education level: High school graduate  Occupational History    Occupation: Disabled  Tobacco Use   Smoking status: Every Day    Packs/day: 0.50    Types: Cigarettes   Smokeless tobacco: Never  Vaping Use   Vaping Use: Former   Start date: 06/27/2013   Quit date: 06/14/2017   Devices: Apple Cinnamon-0 mg  Substance and Sexual Activity   Alcohol use: Not Currently    Comment: hx over 30 years   Drug use: Not Currently    Comment: h/o IVD use   Sexual activity: Not Currently  Other Topics Concern   Not on file  Social History Narrative   Lives at home alone.   Right-handed.   Caffeine use: 4 cups coffee/soda   Social Determinants of Health   Financial Resource Strain: Not on file  Food Insecurity: Not on file  Transportation Needs: Not on file  Physical Activity: Not on file  Stress: Not on file  Social Connections: Not on file  Intimate Partner Violence: Not on file    Family History  Problem Relation Age of Onset   Heart attack Mother    Breast cancer Mother 76   Dementia Mother    Cancer Father 66       died of bleeding from kidneys   Colon cancer Neg Hx    Esophageal cancer Neg Hx    Stomach cancer Neg Hx    Rectal cancer Neg Hx     Current Facility-Administered Medications  Medication Dose Route Frequency Provider Last Rate Last Admin   0.9 %  sodium chloride infusion   Intravenous Continuous Broadus John, MD       0.9 %  sodium chloride infusion   Intravenous Continuous Duane Boston, MD       chlorhexidine (HIBICLENS) 4 % liquid 4 application  60 mL Topical Once Broadus John, MD       And   [START ON 05/02/2021] chlorhexidine (HIBICLENS) 4 % liquid 4 application  60 mL Topical Once Broadus John, MD       vancomycin (VANCOCIN) IVPB 1000 mg/200 mL premix  1,000 mg Intravenous 60 min Pre-Op Broadus John, MD        Allergies  Allergen Reactions   Abilify [Aripiprazole] Other (See Comments)    Tardive dyskinesia Oral   Remeron [Mirtazapine] Other (See Comments)    Wgt stimulation /gain, Dizziness,  Patient says "can tolerate"  Trazodone And Nefazodone Other (See Comments)    Nightmares/sleep diturbance   Flexeril [Cyclobenzaprine] Other (See Comments)    Pt states Flexeril makes her feel depressed    Amoxicillin Diarrhea and Other (See Comments)    NOTE the patient has had PCN WITHOUT reaction Has patient had a PCN reaction causing immediate rash, facial/tongue/throat swelling, SOB or lightheadedness with hypotension: No Has patient had a PCN reaction causing severe rash involving mucus membranes or skin necrosis: No Has patient had a PCN reaction that required hospitalization: No Has patient had a PCN reaction occurring within the last 10 years: No If all of the above answers are "NO", then may proceed with Cephalosporin use.      REVIEW OF SYSTEMS:   [X]  denotes positive finding, [ ]  denotes negative finding Cardiac  Comments:  Chest pain or chest pressure:    Shortness of breath upon exertion:    Short of breath when lying flat:    Irregular heart rhythm:        Vascular    Pain in calf, thigh, or hip brought on by ambulation:    Pain in feet at night that wakes you up from your sleep:     Blood clot in your veins:    Leg swelling:         Pulmonary    Oxygen at home:    Productive cough:     Wheezing:         Neurologic    Sudden weakness in arms or legs:     Sudden numbness in arms or legs:     Sudden onset of difficulty speaking or slurred speech:    Temporary loss of vision in one eye:     Problems with dizziness:         Gastrointestinal    Blood in stool:     Vomited blood:         Genitourinary    Burning when urinating:     Blood in urine:        Psychiatric    Major depression:         Hematologic    Bleeding problems:    Problems with blood clotting too easily:        Skin    Rashes or ulcers:        Constitutional    Fever or chills:      PHYSICAL EXAMINATION:  Vitals:   04/30/21 1441 05/01/21 0602  BP:  (!) 135/54  Pulse:   (!) 54  Resp:  18  Temp:  98 F (36.7 C)  TempSrc:  Oral  SpO2:  100%  Weight: 94.8 kg 93.9 kg  Height: 5\' 2"  (1.575 m) 5\' 2"  (1.575 m)    General:  WDWN in NAD; vital signs documented above Gait: Not observed HENT: WNL, normocephalic Pulmonary: normal non-labored breathing , without Rales, rhonchi,  wheezing Cardiac: regular HR,  Abdomen: soft, NT, no masses Skin: without rashes Vascular Exam/Pulses:  Right Left  Radial 2+ (normal) 2+ (normal)  Ulnar 2+ (normal) 2+ (normal)                   Extremities: without ischemic changes, without Gangrene , without cellulitis; without open wounds;  Musculoskeletal: no muscle wasting or atrophy  Neurologic: A&O X 3;  No focal weakness or paresthesias are detected Psychiatric:  The pt has Normal affect.   Non-Invasive Vascular Imaging:   Noninvasive vascular imaging was independently reviewed demonstrating adequate vein diameters  in bilateral cephalic and basilic veins.  The left cephalic vein demonstrated thrombus in the brachiocephalic portion   ASSESSMENT/PLAN:  Verlisa Vara is a 64 y.o. female who presents with stage IV chronic kidney disease in need of permanent HD access.  Gal has diameters suitable for AV fistula creation in bilateral upper extremities, however the left cephalic is noted to have thrombus on her 04/07/2021 ultrasound.  I placed an ultrasound on the arm during our visit and the thrombus was still present.   The best access for [place]ia at this time is right-sided brachiocephalic fistula.  There is some branching at the antecubital fossa, however this did not appear to be severe. Cephalic veins bilaterally appeared deep, which may necessitate superficialization.  Khamani is aware of this.  After discussing the risk and benefits of right-sided AV fistula creation, [name] elected to proceed. She was asked to start a baby aspirin.  I asked her to also discuss starting a statin medication with her primary care.  Maeven was  booked for surgery on 05/01/2021.  Broadus John, MD [facility] [phone]

## 2021-05-01 NOTE — Anesthesia Postprocedure Evaluation (Signed)
Anesthesia Post Note  Patient: Sherry [name]  Procedure(s) Performed: RIGHT ARTERIOVENOUS (AV) FISTULA CREATION (Right: Arm Lower)     Patient location during evaluation: PACU Anesthesia Type: Regional and MAC Level of consciousness: awake and alert Pain management: pain level controlled Vital Signs Assessment: post-procedure vital signs reviewed and stable Respiratory status: spontaneous breathing and respiratory function stable Cardiovascular status: stable Postop Assessment: no apparent nausea or vomiting Anesthetic complications: no   No notable events documented.  Last Vitals:  Vitals:   05/01/21 1005 05/01/21 1020  BP: 136/66 136/66  Pulse: (!) 56 (!) 59  Resp: 19 14  Temp:  (!) 36.2 C  SpO2: 96% 100%    Last Pain:  Vitals:   05/01/21 1020  TempSrc:   PainSc: 0-No pain                 Leon Montoya DANIEL

## 2021-05-01 NOTE — Transfer of Care (Signed)
Immediate Anesthesia Transfer of Care Note  Patient: Sherry [name]  Procedure(s) Performed: RIGHT ARTERIOVENOUS (AV) FISTULA CREATION (Right: Arm Lower)  Patient Location: PACU  Anesthesia Type:MAC combined with regional for post-op pain  Level of Consciousness: drowsy  Airway & Oxygen Therapy: Patient Spontanous Breathing and Patient connected to face mask oxygen  Post-op Assessment: Report given to RN and Post -op Vital signs reviewed and stable  Post vital signs: Reviewed and stable  Last Vitals:  Vitals Value Taken Time  BP 137/62 05/01/21 0920  Temp    Pulse 59 05/01/21 0924  Resp 13 05/01/21 0924  SpO2 88 % 05/01/21 0924  Vitals shown include unvalidated device data.  Last Pain:  Vitals:   05/01/21 0622  TempSrc:   PainSc: 7       Patients Stated Pain Goal: 3 (65/03/54 6568)  Complications: No notable events documented.

## 2021-05-01 NOTE — Anesthesia Procedure Notes (Signed)
Procedure Name: MAC Date/Time: 05/01/2021 7:59 AM Performed by: Imagene Riches, CRNA Pre-anesthesia Checklist: Patient identified, Emergency Drugs available, Suction available, Patient being monitored and Timeout performed Patient Re-evaluated:Patient Re-evaluated prior to induction Oxygen Delivery Method: Simple face mask

## 2021-05-01 NOTE — Op Note (Addendum)
    NAME: Sherry [name]    MRN: [id number] DOB: [birth year]    DATE OF OPERATION: 05/01/2021  PREOP DIAGNOSIS:    Chronic kidney disease stage IV  POSTOP DIAGNOSIS:    Same  PROCEDURE:    Right-sided, first stage brachiobasilic fistula  SURGEON: Broadus John  ANESTHESIA: Moderate  EBL: 10 mL  INDICATIONS:    Sherry [name] is a 65 y.o. female with stage IV chronic kidney disease, which will require dialysis in the coming months.  She was seen in clinic with vein mapping demonstrating adequate brachiocephalic veins bilaterally as well as brachial basilic veins.  Vein mapping noted thrombus in the left brachiocephalic fistula and branching in the right brachiocephalic fistula.  After discussing the risks and benefits of right-sided brachiocephalic fistula, Sherry [name] elected to proceed.  FINDINGS:   Branching of the right cephalic vein 4 cm from the antecubital fossa. 4 mm basilic vein.   TECHNIQUE:   A right upper extremity block was performed by the anesthesia team. The patient was brought to the operating room and placed in supine position. The right arm was prepped and draped in a standard fashion. IV antibiotics were administered prior to incision.   The case began with insonation of the basilic and cephalic veins in the right arm.  There was branching of the cephalic vein 3 cm proximal to the antecubital fossa.  The branch appeared of sufficient size but deep and therefore a transverse incision was made 3 cm proximal to the antecubital fossa.  The vein was found to be small and deep.  I was worried the fistula would not mature, and knew it would require a second stage revision for superficialization.  Due to these concerns, I made the decision to expose the basilic vein for brachiobasilic fistula, as it was much larger and would also require second stage revision.  The basilic vein was identified and isolated for 4 cm in length. The bicipital aponeurosis was partially released and  the brachial artery freed from its paired brachial veins and secured with a vessel loop. The patient was heparinized. The basilic vein was ligated distally with 2-0 silk, then flushed with heparinized saline. Vascular clamps were placed proximally and distally on the brachial artery and a 5 mm arteriotomy  was created on the brachial artery. This was flushed with heparin saline. The vein was juxtaposed to the artery and an anastomosis was created using 6-0 Prolene.   Prior to completing the anastomsis, the vessels were flushed and the suture line was tied down. There was an excellent thrill in the basilic vein.  This was not palpated at the skin due to the size of the patient's arm. The patient had a 2+ radial pulse. The incision was irrigated and hemostasis acheived. The deeper tissue was closed with 3-0 Vicryl and the skin closed with 4-0 Monocryl.    Dermabond was applied the incisions.  The patient  was neurologically intact, hemodynamically stable and had a 2+ radial pulse at the completion of the procedure.  He was transferred to PACU in stable condition.     Macie Burows, MD [facility]  DATE OF DICTATION:   05/01/2021

## 2021-05-01 NOTE — Discharge Instructions (Signed)
[facility]  Discharge Instructions  AV Fistula or Graft Surgery for Dialysis Access  Please refer to the following instructions for your post-procedure care. Your surgeon or physician assistant will discuss any changes with you.  Activity  You may drive the day following your surgery, if you are comfortable and no longer taking prescription pain medication. Resume full activity as the soreness in your incision resolves.  Bathing/Showering  You may shower after you go home. Keep your incision dry for 48 hours. Do not soak in a bathtub, hot tub, or swim until the incision heals completely. You may not shower if you have a hemodialysis catheter.  Incision Care  Clean your incision with mild soap and water after 48 hours. Pat the area dry with a clean towel. You do not need a bandage unless otherwise instructed. Do not apply any ointments or creams to your incision. You may have skin glue on your incision. Do not peel it off. It will come off on its own in about one week. Your arm may swell a bit after surgery. To reduce swelling use pillows to elevate your arm so it is above your heart. Your doctor will tell you if you need to lightly wrap your arm with an ACE bandage.  Diet  Resume your normal diet. There are not special food restrictions following this procedure. In order to heal from your surgery, it is CRITICAL to get adequate nutrition. Your body requires vitamins, minerals, and protein. Vegetables are the best source of vitamins and minerals. Vegetables also provide the perfect balance of protein. Processed food has little nutritional value, so try to avoid this.  Medications  Resume taking all of your medications. If your incision is causing pain, you may take over-the counter pain relievers such as acetaminophen (Tylenol). If you were prescribed a stronger pain medication, please be aware these medications can cause nausea and constipation. Prevent  nausea by taking the medication with a snack or meal. Avoid constipation by drinking plenty of fluids and eating foods with high amount of fiber, such as fruits, vegetables, and grains.  Do not take Tylenol if you are taking prescription pain medications.  Follow up Your surgeon may want to see you in the office following your access surgery. If so, this will be arranged at the time of your surgery.  Please call us immediately for any of the following conditions:  Increased pain, redness, drainage (pus) from your incision site Fever of 101 degrees or higher Severe or worsening pain at your incision site Hand pain or numbness.  Reduce your risk of vascular disease:  Stop smoking. If you would like help, call [facility] at 1-800-QUIT-NOW 682-036-8506) or [place] [facility] [place] your cholesterol Maintain a desired weight Control your diabetes Keep your blood pressure down  Dialysis  It will take several weeks to several months for your new dialysis access to be ready for use. Your surgeon will determine when it is okay to use it. Your nephrologist will continue to direct your dialysis. You can continue to use your Permcath until your new access is ready for use.   05/01/2021 Sherry [name] [id number] 03/20/1956  Surgeon(s): Broadus John, MD  Procedure(s): RIGHT ARTERIOVENOUS (AV) FISTULA CREATION   May stick graft immediately   May stick graft on designated area only:   X Do not stick right AV fistula for 12 weeks    If you have any questions, please call the office at [phone].

## 2021-05-01 NOTE — H&P (Signed)
Office Note   Patient is a 65 year old female seen in preoperative holding for long-term HD fistula creation. She was seen in the office last week and doing well.  In the interim, the patient was seen in the ED for shortness of breath.  Work-up demonstrated she had not taken any of her home medications. This is since resolved.  She is doing well this morning without any concerns. No change in physical exam since seen in clinic Imaging demonstrates adequate vein for right brachiocephalic fistula After discussing the risks and benefits of right brachiocephalic fistula creation, Sherry [name] elected to proceed.  She is aware, her cephalic vein is deep, and may require superficialization at a later date pending fistula maturation.   CC:  ESRD Requesting Provider:  No ref. provider found  HPI: Sherry [name] is a Right handed 65 y.o. ([birth year]) female with kidney disease who presents at the request of No ref. provider found for permanent HD access. Pt has had no prior access procedures.  Tunnel lines, no current access.  On exam, Sherry [name] was doing well.  She is aware of her chronic kidney disease that is deteriorating to end-stage renal disease.  She denied any previous history of upper extremity effort-induced pain, rest pain, ulceration.  The pt is not on a statin for cholesterol management.  The pt is not on a daily aspirin.   Other AC:  - The pt is  on medications for hypertension.   The pt is not diabetic. Tobacco hx:  former  Past Medical History:  Diagnosis Date   Alcohol abuse 05/09/2019   Alcohol use disorder, severe, dependence ([place]) 08/27/2017   Alcohol withdrawal ([place]) 08/09/2017   Anxiety    ARF (acute renal failure) ([place]) 04/09/2020   Arthropathy of lumbar facet joint 05/24/2020   Bilateral low back pain with bilateral sciatica 01/05/2019   Bipolar and related disorder ([place])    Blood transfusion without reported diagnosis    Body mass index (BMI) 40.0-44.9, adult ([place]) 07/19/2020    Breast cancer ([place])    right lumpectemy and lymph node    Cancer ([place]) 09/06/2017   Cervical spondylosis 05/24/2020   Chest pain 05/31/2020   Chest pain, rule out acute myocardial infarction 05/29/2020   CHF (congestive heart failure) (HCC)    Chronic back pain    Chronic bilateral low back pain with right-sided sciatica 05/24/2020   Chronic diastolic CHF (congestive heart failure) ([place]) 05/09/2019   Chronic kidney disease    "Stage IV" - states due to hypertension   Chronic kidney disease, stage III (moderate) ([place]) 11/17/2016   Chronic low back pain 09/06/2017   Disc levels:      No abnormality at L2-3 or above.      L3-4:  Mild bulging of the disc.  No stenosis.      L4-5: Bilateral facet arthropathy with gaping, fluid-filled joints.   Joint edema. Anterolisthesis would likely occur at this level with   standing and flexion. Disc degeneration with mild bulging of the   disc. Mild narrowing of the lateral recesses and foramina, which   would likely worsen   Cigarette smoker 12/31/2017   CKD (chronic kidney disease) stage 4, GFR 15-29 ml/min (HCC) 04/18/2020   Clonus 05/24/2020   Cyst of ovary    Depression    Depression with anxiety 08/21/2016   Dizziness 02/24/2018   DOE (dyspnea on exertion) 12/30/2017   12/30/2017   Walked RA x one lap @ 185 stopped due to  Sob/ lightheaded weak  with sats 96% at end - Spirometry 12/30/2017  FEV1 1.27 (68%)  Ratio 99 s curvature / abn effort dep portion only     Dyspnea    Dyspnea and respiratory abnormality 02/13/2013   Formatting of this note might be different from the original. STORY: PSG on 05/20/12 showed no OSA but upper airway resistance syndrome, AHI 3.9, severe snoring was noted. ESS=8. UARS. Advise positional therapy, weight loss program, regular exercise.   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 EOT 08-26-16 - not detected. 12 week post treatment 12-11-16 - not detected. Informed patient that she was cured. Patient verbalized understanding.          History of breast cancer 02/13/2013   Hx of bipolar disorder 01/31/2016   Hypertension    Hypertensive emergency 10/21/2017   Impaired functional mobility, balance, gait, and endurance 01/09/2021   Left-sided weakness    MDD (major depressive disorder), recurrent severe, without psychosis ([place])    Medication intolerance 09/06/2017   Remeron-appetite stimulant/Dizziness/Dysphoria   Microcytic anemia 10/21/2017   Morbid obesity due to excess calories ([place]) 02/13/2013   BMI 42.9   MRSA infection    of breast incision   Nausea 04/09/2020   Formatting of this note might be different from the original. Added automatically from request for surgery [id number]   Near syncope 05/29/2020   Neck pain 05/24/2020   Neuroleptic-induced tardive dyskinesia 09/06/2017   Abilify   Opioid dependence ([place])    Has been [facility] in [facility] in past   Opioid use disorder, severe, dependence ([place]) 08/27/2017   Oral aphthous ulcer 06/25/2015   Last Assessment & Plan:  Formatting of this note might be different from the original. - Appear to be resolving as a be expected in the case of aphthous stomatitis. - Continue topical therapy with viscous lidocaine.  Recommended trying high-dose anti-inflammatory such as 600 mg of ibuprofen as needed. - She'll return to ENT clinic if symptoms do not resolve.   Phlebitis after infusion 10/28/2017   Rt antecubital fossa   Pre-operative cardiovascular examination 06/13/2018   Restless legs syndrome 02/13/2013   Formatting of this note might be different from the original. IMPRESSION: Possible. Will follow.    Spondylolisthesis, lumbar region 10/21/2020   Subacute dyskinesia due to drug 02/13/2013   Formatting of this note might be different from the original. STORY: Tardive dyskinesia and mild limb dyskinesia, LE>UE which likely due to prolonged antipsychotic used, abilify. Couldn't tolerate artane, gabapentin, clonazepam and xenazine.`E1o3L`IMPRESSION: Well tolerated depakote 250mg  bid, mood aspect is better as well. Will increase to 500mg  bid. ADR was discussed.  RTC 4-6 weeks.   Substance abuse ([place])    Substance induced mood disorder ([place]) 08/13/2017   TIA (transient ischemic attack) 2020   P/w BP >230/120 and neurologic symptoms, presumed TIA    Past Surgical History:  Procedure Laterality Date   BACK SURGERY  1990   BREAST SURGERY Right 2010   "breast cancer survivor" - states partial mastectomy and nodes   COLONOSCOPY     [facility]   ESOPHAGOGASTRODUODENOSCOPY  [facility]   ESOPHAGOGASTRODUODENOSCOPY  04/18/2020   [facility]   LAPAROSCOPIC CHOLECYSTECTOMY  2002   LAPAROSCOPIC INCISIONAL / UMBILICAL / VENTRAL HERNIA REPAIR  04/13/2018   with BARD 15x 41PF mesh (supraumbilical)   LAPAROSCOPIC LYSIS OF ADHESIONS  07/12/2018   Procedure: LAPAROSCOPIC LYSIS OF ADHESIONS;  Surgeon: Isabel Caprice, MD;  Location: [facility];  Service: Gynecology;;   MULTIPLE TOOTH EXTRACTIONS     MYOMECTOMY     x 2 prior to hysterectomy   ROBOTIC ASSISTED BILATERAL SALPINGO OOPHERECTOMY Right 07/12/2018   Procedure: XI ROBOTIC ASSISTED RIGHT SALPINGO OOPHORECTOMY;  Surgeon: Isabel Caprice, MD;  Location: [facility];  Service: Gynecology;  Laterality: Right;   TOTAL ABDOMINAL HYSTERECTOMY     fibroids    UPPER GASTROINTESTINAL ENDOSCOPY     WISDOM TOOTH EXTRACTION      Social History   Socioeconomic History   Marital status: Single    Spouse name: Not on file   Number of children: 0   Years of education: 12   Highest education level: High school graduate  Occupational History    Occupation: Disabled  Tobacco Use   Smoking status: Every Day    Packs/day: 0.50    Types: Cigarettes   Smokeless tobacco: Never  Vaping Use   Vaping Use: Former   Start date: 06/27/2013   Quit date: 06/14/2017   Devices: Apple Cinnamon-0 mg  Substance and Sexual Activity   Alcohol use: Not Currently    Comment: hx over 30 years   Drug use: Not Currently    Comment: h/o IVD use   Sexual activity: Not Currently  Other Topics Concern   Not on file  Social History Narrative   Lives at home alone.   Right-handed.   Caffeine use: 4 cups coffee/soda   Social Determinants of Health   Financial Resource Strain: Not on file  Food Insecurity: Not on file  Transportation Needs: Not on file  Physical Activity: Not on file  Stress: Not on file  Social Connections: Not on file  Intimate Partner Violence: Not on file    Family History  Problem Relation Age of Onset   Heart attack Mother    Breast cancer Mother 32   Dementia Mother    Cancer Father 23       died of bleeding from kidneys   Colon cancer Neg Hx    Esophageal cancer Neg Hx    Stomach cancer Neg Hx    Rectal cancer Neg Hx     Current Facility-Administered Medications  Medication Dose Route Frequency Provider Last Rate Last Admin   0.9 %  sodium chloride infusion   Intravenous Continuous Broadus John, MD   New Bag at 05/01/21 0715   0.9 %  sodium chloride infusion   Intravenous Continuous Duane Boston, MD       amisulpride (BARHEMSYS) injection 10 mg  10 mg Intravenous Once PRN Duane Boston, MD       chlorhexidine (HIBICLENS) 4 % liquid 4 application  60 mL Topical Once Broadus John, MD       And   [START ON 05/02/2021] chlorhexidine (HIBICLENS) 4 % liquid 4 application  60 mL Topical Once Broadus John, MD       fentaNYL (SUBLIMAZE) injection 25-50 mcg  25-50 mcg Intravenous Q5 min PRN Duane Boston, MD       promethazine (PHENERGAN) injection 6.25-12.5 mg  6.25-12.5 mg Intravenous Q15 min PRN Duane Boston, MD  Current Outpatient Medications  Medication Sig Dispense Refill   albuterol (VENTOLIN HFA) 108 (90 Base) MCG/ACT inhaler Inhale 2 puffs into the lungs every 4 (four) hours as needed.     amLODipine (NORVASC) 10 MG tablet Take 10 mg by mouth in the morning.     docusate sodium (COLACE) 100 MG capsule Take 100 mg by mouth in the morning.     ferrous sulfate 325 (65 FE) MG tablet Take 325 mg by mouth in the morning and at bedtime.     FLUoxetine (PROZAC) 40 MG capsule Take 40 mg by mouth in the morning.     gabapentin (NEURONTIN) 600 MG tablet Take 600 mg by mouth 3 (three) times daily.     hydrALAZINE (APRESOLINE) 50 MG tablet Take 100 mg by mouth 3 (three) times daily.     INGREZZA 80 MG CAPS Take 80 mg by mouth at bedtime.     isosorbide mononitrate (IMDUR) 60 MG 24 hr tablet Take 60 mg by mouth in the morning.     labetalol (NORMODYNE) 300 MG tablet Take 300 mg by mouth 2 (two) times daily.     Melatonin 10 MG TABS Take by mouth.     ondansetron (ZOFRAN-ODT) 4 MG disintegrating tablet Take 4 mg by mouth every 8 (eight) hours as needed for vomiting or nausea.     oxyCODONE-acetaminophen (PERCOCET) 10-325 MG tablet Take 1 tablet by mouth in the morning and at bedtime.     pantoprazole (PROTONIX) 40 MG tablet Take 40 mg by mouth daily.     Cholecalciferol (VITAMIN D3) 50 MCG (2000 UT) capsule Take 1 capsule (2,000 Units total) by mouth daily. (Patient not taking: No sig reported) 30 capsule 0   furosemide (LASIX) 40 MG tablet Take 40 mg by mouth daily.     naloxone (NARCAN) nasal spray 4 mg/0.1 mL SMARTSIG:Both Nares      Allergies  Allergen Reactions   Abilify [Aripiprazole] Other (See Comments)    Tardive dyskinesia Oral   Remeron [Mirtazapine] Other (See Comments)    Wgt stimulation /gain, Dizziness, Patient says "can tolerate"   Trazodone And Nefazodone Other (See Comments)    Nightmares/sleep diturbance   Flexeril [Cyclobenzaprine] Other (See Comments)    Pt states  Flexeril makes her feel depressed    Amoxicillin Diarrhea and Other (See Comments)    NOTE the patient has had PCN WITHOUT reaction Has patient had a PCN reaction causing immediate rash, facial/tongue/throat swelling, SOB or lightheadedness with hypotension: No Has patient had a PCN reaction causing severe rash involving mucus membranes or skin necrosis: No Has patient had a PCN reaction that required hospitalization: No Has patient had a PCN reaction occurring within the last 10 years: No If all of the above answers are "NO", then may proceed with Cephalosporin use.      REVIEW OF SYSTEMS:   [X]  denotes positive finding, [ ]  denotes negative finding Cardiac  Comments:  Chest pain or chest pressure:    Shortness of breath upon exertion:    Short of breath when lying flat:    Irregular heart rhythm:        Vascular    Pain in calf, thigh, or hip brought on by ambulation:    Pain in feet at night that wakes you up from your sleep:     Blood clot in your veins:    Leg swelling:         Pulmonary    Oxygen at home:  Productive cough:     Wheezing:         Neurologic    Sudden weakness in arms or legs:     Sudden numbness in arms or legs:     Sudden onset of difficulty speaking or slurred speech:    Temporary loss of vision in one eye:     Problems with dizziness:         Gastrointestinal    Blood in stool:     Vomited blood:         Genitourinary    Burning when urinating:     Blood in urine:        Psychiatric    Major depression:         Hematologic    Bleeding problems:    Problems with blood clotting too easily:        Skin    Rashes or ulcers:        Constitutional    Fever or chills:      PHYSICAL EXAMINATION:  Vitals:   05/01/21 0935 05/01/21 0950 05/01/21 1005 05/01/21 1020  BP: (!) 144/71 (!) 143/83 136/66 136/66  Pulse: (!) 57 (!) 58 (!) 56 (!) 59  Resp: 13 11 19 14   Temp:    (!) 97.1 F (36.2 C)  TempSrc:      SpO2: 96% 94% 96% 100%   Weight:      Height:        General:  WDWN in NAD; vital signs documented above Gait: Not observed HENT: WNL, normocephalic Pulmonary: normal non-labored breathing , without Rales, rhonchi,  wheezing Cardiac: regular HR,  Abdomen: soft, NT, no masses Skin: without rashes Vascular Exam/Pulses:  Right Left  Radial 2+ (normal) 2+ (normal)  Ulnar 2+ (normal) 2+ (normal)                   Extremities: without ischemic changes, without Gangrene , without cellulitis; without open wounds;  Musculoskeletal: no muscle wasting or atrophy  Neurologic: A&O X 3;  No focal weakness or paresthesias are detected Psychiatric:  The pt has Normal affect.   Non-Invasive Vascular Imaging:   Noninvasive vascular imaging was independently reviewed demonstrating adequate vein diameters in bilateral cephalic and basilic veins.  The left cephalic vein demonstrated thrombus in the brachiocephalic portion   ASSESSMENT/PLAN:  Sherry [name] is a 64 y.o. female who presents with stage IV chronic kidney disease in need of permanent HD access.  Sherry [name] has diameters suitable for AV fistula creation in bilateral upper extremities, however the left cephalic is noted to have thrombus on her 04/07/2021 ultrasound.  I placed an ultrasound on the arm during our visit and the thrombus was still present.   The best access for Sherry [name] at this time is right-sided brachiocephalic fistula.  There is some branching at the antecubital fossa, however this did not appear to be severe. Cephalic veins bilaterally appeared deep, which may necessitate superficialization.  Sherry [name] is aware of this.  After discussing the risk and benefits of right-sided AV fistula creation, Sherry [name] elected to proceed. She was asked to start a baby aspirin.  I asked her to also discuss starting a statin medication with her primary care.  Sherry [name] was booked for surgery on 05/01/2021.  Broadus John, MD [facility] [phone]

## 2021-05-01 NOTE — Anesthesia Procedure Notes (Signed)
Anesthesia Regional Block: Supraclavicular block   Pre-Anesthetic Checklist: , timeout performed,  Correct Patient, Correct Site, Correct Laterality,  Correct Procedure, Correct Position, site marked,  Risks and benefits discussed,  Surgical consent,  Pre-op evaluation,  At surgeon's request and post-op pain management  Laterality: Right  Prep: chloraprep       Needles:  Injection technique: Single-shot  Needle Type: Echogenic Stimulator Needle     Needle Length: 5cm  Needle Gauge: 22     Additional Needles:   Narrative:  Start time: 05/01/2021 7:21 AM End time: 05/01/2021 7:31 AM Injection made incrementally with aspirations every 5 mL.  Performed by: Personally  Anesthesiologist: Duane Boston, MD  Additional Notes: Functioning IV was confirmed and monitors applied.  A 54mm 22ga echogenic arrow stimulator was used. Sterile prep and drape,hand hygiene and sterile gloves were used.Ultrasound guidance: relevant anatomy identified, needle position confirmed, local anesthetic spread visualized around nerve(s)., vascular puncture avoided.  Image printed for medical record.  Negative aspiration and negative test dose prior to incremental administration of local anesthetic. The patient tolerated the procedure well.

## 2021-05-02 ENCOUNTER — Encounter (HOSPITAL_COMMUNITY): Payer: Self-pay | Admitting: Vascular Surgery

## 2021-05-04 ENCOUNTER — Other Ambulatory Visit: Payer: Self-pay | Admitting: Vascular Surgery

## 2021-05-04 MED ORDER — OXYCODONE-ACETAMINOPHEN 5-325 MG PO TABS: 1.0000 | ORAL_TABLET | ORAL | 0 refills | Status: DC | PRN

## 2021-05-04 NOTE — Progress Notes (Signed)
Pt had surgery last Thursday. Says she did not get pain meds post op.

## 2021-05-13 ENCOUNTER — Emergency Department (HOSPITAL_BASED_OUTPATIENT_CLINIC_OR_DEPARTMENT_OTHER)
Admission: EM | Admit: 2021-05-13 | Discharge: 2021-05-13 | Disposition: A | Discharge status: Home / Self Care | Payer: Medicare HMO | Attending: Emergency Medicine | Admitting: Emergency Medicine

## 2021-05-13 ENCOUNTER — Encounter (HOSPITAL_BASED_OUTPATIENT_CLINIC_OR_DEPARTMENT_OTHER): Payer: Self-pay | Admitting: *Deleted

## 2021-05-13 ENCOUNTER — Other Ambulatory Visit: Payer: Self-pay

## 2021-05-13 DIAGNOSIS — F17210 Nicotine dependence, cigarettes, uncomplicated: Secondary | ICD-10-CM | POA: Diagnosis not present

## 2021-05-13 DIAGNOSIS — Z853 Personal history of malignant neoplasm of breast: Secondary | ICD-10-CM | POA: Insufficient documentation

## 2021-05-13 DIAGNOSIS — Z79899 Other long term (current) drug therapy: Secondary | ICD-10-CM | POA: Insufficient documentation

## 2021-05-13 DIAGNOSIS — I5032 Chronic diastolic (congestive) heart failure: Secondary | ICD-10-CM | POA: Diagnosis not present

## 2021-05-13 DIAGNOSIS — I130 Hypertensive heart and chronic kidney disease with heart failure and stage 1 through stage 4 chronic kidney disease, or unspecified chronic kidney disease: Secondary | ICD-10-CM | POA: Diagnosis not present

## 2021-05-13 DIAGNOSIS — M5441 Lumbago with sciatica, right side: Secondary | ICD-10-CM | POA: Diagnosis not present

## 2021-05-13 DIAGNOSIS — N184 Chronic kidney disease, stage 4 (severe): Secondary | ICD-10-CM | POA: Diagnosis not present

## 2021-05-13 DIAGNOSIS — M545 Low back pain, unspecified: Secondary | ICD-10-CM | POA: Diagnosis present

## 2021-05-13 DIAGNOSIS — Z8616 Personal history of COVID-19: Secondary | ICD-10-CM | POA: Insufficient documentation

## 2021-05-13 DIAGNOSIS — M5431 Sciatica, right side: Secondary | ICD-10-CM

## 2021-05-13 MED ORDER — GABAPENTIN 300 MG PO CAPS: 300.0000 mg | ORAL_CAPSULE | Freq: Every day | ORAL | 0 refills | Status: DC

## 2021-05-13 MED ORDER — ACETAMINOPHEN 325 MG PO TABS
650.0000 mg | ORAL_TABLET | Freq: Once | ORAL | Status: AC
  Administered 2021-05-13: 650 mg via ORAL
  Filled 2021-05-13: qty 2

## 2021-05-13 MED ORDER — HYDROMORPHONE HCL 1 MG/ML IJ SOLN
1.0000 mg | Freq: Once | INTRAMUSCULAR | Status: AC
Start: 2021-05-13 — End: 2021-05-13
  Administered 2021-05-13: 1 mg via INTRAMUSCULAR
  Filled 2021-05-13: qty 1

## 2021-05-13 MED ORDER — DEXAMETHASONE 4 MG PO TABS
10.0000 mg | ORAL_TABLET | Freq: Once | ORAL | Status: AC
  Administered 2021-05-13: 10 mg via ORAL
  Filled 2021-05-13: qty 3

## 2021-05-13 NOTE — Discharge Instructions (Addendum)
I have decreased your home dose of gabapentin to 300mg  once daily given your kidney function.  We hope you feel better soon!

## 2021-05-13 NOTE — ED Notes (Signed)
Discharge instructions discussed with pt. Pt verbalized understanding. Pt stable and ambulatory.  °

## 2021-05-13 NOTE — ED Triage Notes (Signed)
To ER via EMS c.o back pain x 2 days. Pain started after helping a friend move furniture.

## 2021-05-13 NOTE — ED Provider Notes (Signed)
[facility] Provider Note   CSN: [id number] Arrival date & time: 05/13/21  1706     History Chief Complaint  Patient presents with   Back Pain    Sherry [name] is a 65 y.o. female.  HPI     Back pain started a few days ago, acute severe lower back pain, radiating down the right leg Nothing makes it better or worse, worse with walking Occurred after helping someone move, occurred when lifting, suddenly No other falls or trauma No numbness or weakness No loss of control of bowel or bladder Hx of breast cancer, in remission No hx of IVDU, fevers Hx of chronic back pain however pain worsened when lifting a few days ago had prior back surgery  Has tried tylenol, had one gabapentin 400mg  but don't have any more left  Wanted to see if could get something to break this pain and get gabapentin refilled No hx of DM   Past Medical History:  Diagnosis Date   Alcohol abuse 05/09/2019   Alcohol use disorder, severe, dependence ([place]) 08/27/2017   Alcohol withdrawal ([place]) 08/09/2017   Anxiety    ARF (acute renal failure) ([place]) 04/09/2020   Arthropathy of lumbar facet joint 05/24/2020   Bilateral low back pain with bilateral sciatica 01/05/2019   Bipolar and related disorder ([place])    Blood transfusion without reported diagnosis    Body mass index (BMI) 40.0-44.9, adult ([place]) 07/19/2020   Breast cancer ([place])    right lumpectemy and lymph node    Cancer ([place]) 09/06/2017   Cervical spondylosis 05/24/2020   Chest pain 05/31/2020   Chest pain, rule out acute myocardial infarction 05/29/2020   CHF (congestive heart failure) (HCC)    Chronic back pain    Chronic bilateral low back pain with right-sided sciatica 05/24/2020   Chronic diastolic CHF (congestive heart failure) ([place]) 05/09/2019   Chronic kidney disease    "Stage IV" - states due to hypertension   Chronic kidney disease, stage III (moderate) ([place]) 11/17/2016   Chronic low back pain 09/06/2017   Disc  levels:      No abnormality at L2-3 or above.      L3-4:  Mild bulging of the disc.  No stenosis.      L4-5: Bilateral facet arthropathy with gaping, fluid-filled joints.   Joint edema. Anterolisthesis would likely occur at this level with   standing and flexion. Disc degeneration with mild bulging of the   disc. Mild narrowing of the lateral recesses and foramina, which   would likely worsen   Cigarette smoker 12/31/2017   CKD (chronic kidney disease) stage 4, GFR 15-29 ml/min (HCC) 04/18/2020   Clonus 05/24/2020   Cyst of ovary    Depression    Depression with anxiety 08/21/2016   Dizziness 02/24/2018   DOE (dyspnea on exertion) 12/30/2017   12/30/2017   Walked RA x one lap @ 185 stopped due to  Sob/ lightheaded weak with sats 96% at end - Spirometry 12/30/2017  FEV1 1.27 (68%)  Ratio 99 s curvature / abn effort dep portion only     Dyspnea    Dyspnea and respiratory abnormality 02/13/2013   Formatting of this note might be different from the original. STORY: PSG on 05/20/12 showed no OSA but upper airway resistance syndrome, AHI 3.9, severe snoring was noted. ESS=8. UARS. Advise positional therapy, weight loss program, regular exercise.   Essential hypertension 07/06/2017   GERD (gastroesophageal reflux disease)    Grief reaction with  prolonged bereavement 09/06/2017   Partner of 18 yrs left 2018-pt admits she still loves her   Hepatitis C    recieved treatment for Hep C   Hepatitis C virus infection cured after antiviral drug therapy 12/17/2012   Telephone Encounter - Dub Mikes, RN - 12/28/2016 9:54 AM EDT Patient called for HCV RNA test results. EOT 08-26-16 - not detected. 12 week post treatment 12-11-16 - not detected. Informed patient that she was cured. Patient verbalized understanding.          History of breast cancer 02/13/2013   Hx of bipolar disorder 01/31/2016   Hypertension    Hypertensive emergency 10/21/2017   Impaired functional mobility, balance, gait, and endurance 01/09/2021   Left-sided  weakness    MDD (major depressive disorder), recurrent severe, without psychosis ([place])    Medication intolerance 09/06/2017   Remeron-appetite stimulant/Dizziness/Dysphoria   Microcytic anemia 10/21/2017   Morbid obesity due to excess calories ([place]) 02/13/2013   BMI 42.9   MRSA infection    of breast incision   Nausea 04/09/2020   Formatting of this note might be different from the original. Added automatically from request for surgery [id number]   Near syncope 05/29/2020   Neck pain 05/24/2020   Neuroleptic-induced tardive dyskinesia 09/06/2017   Abilify   Opioid dependence ([place])    Has been treated in [facility] in past   Opioid use disorder, severe, dependence ([place]) 08/27/2017   Oral aphthous ulcer 06/25/2015   Last Assessment & Plan:  Formatting of this note might be different from the original. - Appear to be resolving as a be expected in the case of aphthous stomatitis. - Continue topical therapy with viscous lidocaine.  Recommended trying high-dose anti-inflammatory such as 600 mg of ibuprofen as needed. - She'll return to ENT clinic if symptoms do not resolve.   Phlebitis after infusion 10/28/2017   Rt antecubital fossa   Pre-operative cardiovascular examination 06/13/2018   Restless legs syndrome 02/13/2013   Formatting of this note might be different from the original. IMPRESSION: Possible. Will follow.   Spondylolisthesis, lumbar region 10/21/2020   Subacute dyskinesia due to drug 02/13/2013   Formatting of this note might be different from the original. STORY: Tardive dyskinesia and mild limb dyskinesia, LE>UE which likely due to prolonged antipsychotic used, abilify. Couldn't tolerate artane, gabapentin, clonazepam and xenazine.`E1o3L`IMPRESSION: Well tolerated depakote 250mg  bid, mood aspect is better as well. Will increase to 500mg  bid. ADR was discussed.  RTC 4-6 weeks.   Substance abuse ([place])    Substance induced mood disorder ([place]) 08/13/2017   TIA (transient ischemic attack) 2020    P/w BP >230/120 and neurologic symptoms, presumed TIA    Patient Active Problem List   Diagnosis Date Noted   COVID-19 virus infection 04/05/2021   Hypertensive urgency 04/04/2021   Tardive dyskinesia 04/04/2021   Hyperkalemia 04/03/2021   Impaired functional mobility, balance, gait, and endurance 01/09/2021   Spondylolisthesis, lumbar region 10/21/2020   Anxiety    Bipolar and related disorder ([place])    Blood transfusion without reported diagnosis    Breast cancer ([place])    CHF (congestive heart failure) (HCC)    Chronic back pain    Chronic kidney disease    Depression    Dyspnea    Hepatitis C    MRSA infection    Opioid dependence ([place])    Substance abuse ([place])    Body mass index (BMI) 40.0-44.9, adult ([place]) 07/19/2020   Chest pain 05/31/2020   Near syncope  05/29/2020   Chest pain, rule out acute myocardial infarction 05/29/2020   Cervical spondylosis 05/24/2020   Clonus 05/24/2020   Neck pain 05/24/2020   Chronic bilateral low back pain with right-sided sciatica 05/24/2020   Arthropathy of lumbar facet joint 05/24/2020   CKD (chronic kidney disease) stage 4, GFR 15-29 ml/min (HCC) 04/18/2020   ARF (acute renal failure) ([place]) 04/09/2020   Nausea 04/09/2020   Chronic diastolic CHF (congestive heart failure) ([place]) 05/09/2019   GERD (gastroesophageal reflux disease) 05/09/2019   Alcohol abuse 05/09/2019   Hypertension 05/08/2019   Bilateral low back pain with bilateral sciatica 01/05/2019   TIA (transient ischemic attack) 2020   Cyst of ovary    Pre-operative cardiovascular examination 06/13/2018   Dizziness 02/24/2018   Left-sided weakness 02/24/2018   Cigarette smoker 12/31/2017   DOE (dyspnea on exertion) 12/30/2017   Phlebitis after infusion 10/28/2017   Hypertensive emergency 10/21/2017   Microcytic anemia 10/21/2017   Medication intolerance 09/06/2017   Neuroleptic-induced tardive dyskinesia 09/06/2017   Cancer ([place]) 09/06/2017   Chronic low back pain  09/06/2017   Grief reaction with prolonged bereavement 09/06/2017   Opioid use disorder, severe, dependence ([place]) 08/27/2017   Alcohol use disorder, severe, dependence ([place]) 08/27/2017   Substance induced mood disorder ([place]) 08/13/2017   MDD (major depressive disorder), recurrent severe, without psychosis ([place])    Alcohol withdrawal ([place]) 08/09/2017   Essential hypertension 07/06/2017   Chronic kidney disease, stage III (moderate) ([place]) 11/17/2016   Depression with anxiety 08/21/2016   Hx of bipolar disorder 01/31/2016   Oral aphthous ulcer 06/25/2015   History of breast cancer 02/13/2013   Morbid obesity due to excess calories ([place]) 02/13/2013   Restless legs syndrome 02/13/2013   Subacute dyskinesia due to drug 02/13/2013   Dyspnea and respiratory abnormality 02/13/2013   Hepatitis C virus infection cured after antiviral drug therapy 12/17/2012    Past Surgical History:  Procedure Laterality Date   AV FISTULA PLACEMENT Right 05/01/2021   Procedure: RIGHT ARTERIOVENOUS (AV) FISTULA CREATION;  Surgeon: Broadus John, MD;  Location: [facility] OR;  Service: Vascular;  Laterality: Right;  [place]   BREAST SURGERY Right 2010   "breast cancer survivor" - states partial mastectomy and nodes   COLONOSCOPY     [facility]   ESOPHAGOGASTRODUODENOSCOPY     [facility]   ESOPHAGOGASTRODUODENOSCOPY  04/18/2020   [facility]   LAPAROSCOPIC CHOLECYSTECTOMY  2002   LAPAROSCOPIC INCISIONAL / UMBILICAL / [place]  04/13/2018   with BARD 15x 96QI mesh (supraumbilical)   LAPAROSCOPIC LYSIS OF ADHESIONS  07/12/2018   Procedure: LAPAROSCOPIC LYSIS OF ADHESIONS;  Surgeon: Isabel Caprice, MD;  Location: [facility] ORS;  Service: Gynecology;;   MULTIPLE TOOTH EXTRACTIONS     MYOMECTOMY     x 2 prior to hysterectomy   ROBOTIC ASSISTED BILATERAL SALPINGO OOPHERECTOMY Right 07/12/2018   Procedure: XI ROBOTIC ASSISTED RIGHT SALPINGO OOPHORECTOMY;   Surgeon: Isabel Caprice, MD;  Location: [facility] ORS;  Service: Gynecology;  Laterality: Right;   TOTAL ABDOMINAL HYSTERECTOMY     fibroids    UPPER GASTROINTESTINAL ENDOSCOPY     WISDOM TOOTH EXTRACTION       OB History     Gravida  2   Para  0   Term      Preterm      AB  0   Living         SAB  IAB      Ectopic      Multiple      Live Births              Family History  Problem Relation Age of Onset   Heart attack Mother    Breast cancer Mother 20   Dementia Mother    Cancer Father 4       died of bleeding from kidneys   Colon cancer Neg Hx    Esophageal cancer Neg Hx    Stomach cancer Neg Hx    Rectal cancer Neg Hx     Social History   Tobacco Use   Smoking status: Every Day    Packs/day: 0.50    Types: Cigarettes   Smokeless tobacco: Never  Vaping Use   Vaping Use: Former   Start date: 06/27/2013   Quit date: 06/14/2017   Devices: Apple Cinnamon-0 mg  Substance Use Topics   Alcohol use: Not Currently    Comment: hx over 30 years   Drug use: Not Currently    Comment: h/o IVD use    Home Medications Prior to Admission medications   Medication Sig Start Date End Date Taking? Authorizing Provider  gabapentin (NEURONTIN) 300 MG capsule Take 1 capsule (300 mg total) by mouth at bedtime. 05/13/21 06/12/21 Yes Gareth Morgan, MD  albuterol (VENTOLIN HFA) 108 (90 Base) MCG/ACT inhaler Inhale 2 puffs into the lungs every 4 (four) hours as needed. 04/23/21   [provider]  amLODipine (NORVASC) 10 MG tablet Take 10 mg by mouth in the morning.    [provider]  Cholecalciferol (VITAMIN D3) 50 MCG (2000 UT) capsule Take 1 capsule (2,000 Units total) by mouth daily. Patient not taking: No sig reported 09/26/20   Wendie Agreste, MD  docusate sodium (COLACE) 100 MG capsule Take 100 mg by mouth in the morning.    [provider]  ferrous sulfate 325 (65 FE) MG tablet Take 325 mg by mouth in the morning and at bedtime.     [provider]  FLUoxetine (PROZAC) 40 MG capsule Take 40 mg by mouth in the morning. 11/09/18   [provider]  furosemide (LASIX) 40 MG tablet Take 40 mg by mouth daily. 04/30/21   [provider]  hydrALAZINE (APRESOLINE) 50 MG tablet Take 100 mg by mouth 3 (three) times daily. 03/24/21   [provider]  INGREZZA 80 MG CAPS Take 80 mg by mouth at bedtime. 10/15/17   [provider]  isosorbide mononitrate (IMDUR) 60 MG 24 hr tablet Take 60 mg by mouth in the morning.    [provider]  labetalol (NORMODYNE) 300 MG tablet Take 300 mg by mouth 2 (two) times daily.    [provider]  Melatonin 10 MG TABS Take by mouth.    [provider]  naloxone [facility]) nasal spray 4 mg/0.1 mL SMARTSIG:Both Nares 02/25/21   [provider]  ondansetron (ZOFRAN-ODT) 4 MG disintegrating tablet Take 4 mg by mouth every 8 (eight) hours as needed for vomiting or nausea. 03/26/21   [provider]  oxyCODONE-acetaminophen (PERCOCET) 10-325 MG tablet Take 1 tablet by mouth in the morning and at bedtime. 03/24/21   [provider]  oxyCODONE-acetaminophen (PERCOCET/ROXICET) 5-325 MG tablet Take 1-2 tablets by mouth every 4 (four) hours as needed for severe pain. 05/04/21   Angelia Mould, MD  pantoprazole (PROTONIX) 40 MG tablet Take 40 mg by mouth daily. 04/05/21   [provider]    Allergies    Abilify [aripiprazole], Remeron [mirtazapine], Trazodone and nefazodone, Flexeril [cyclobenzaprine], and Amoxicillin  Review of Systems   Review of Systems  Constitutional:  Negative for fever.  Respiratory:  Negative for shortness of breath.   Cardiovascular:  Negative for chest pain.  Gastrointestinal:  Negative for abdominal pain, nausea and vomiting.  Genitourinary:  Negative for difficulty urinating and dysuria.  Musculoskeletal:  Positive for back pain.  Skin:  Negative for rash.  Neurological:   Negative for syncope, weakness and numbness.   Physical Exam Updated Vital Signs BP (!) 200/78   Pulse 81   Temp 98.2 F (36.8 C) (Oral)   Resp 18   Ht 5\' 2"  (1.575 m)   Wt 93.9 kg   SpO2 100%   BMI 37.86 kg/m   Physical Exam Vitals and nursing note reviewed.  Constitutional:      General: She is not in acute distress.    Appearance: Normal appearance. She is not ill-appearing, toxic-appearing or diaphoretic.  HENT:     Head: Normocephalic.  Eyes:     Conjunctiva/sclera: Conjunctivae normal.  Cardiovascular:     Rate and Rhythm: Normal rate and regular rhythm.     Pulses: Normal pulses.  Pulmonary:     Effort: Pulmonary effort is normal. No respiratory distress.  Musculoskeletal:        General: No deformity or signs of injury.     Cervical back: No rigidity.  Skin:    General: Skin is warm and dry.     Coloration: Skin is not jaundiced or pale.  Neurological:     General: No focal deficit present.     Mental Status: She is alert and oriented to person, place, and time.    ED Results / Procedures / Treatments   Labs (all labs ordered are listed, but only abnormal results are displayed) Labs Reviewed - No data to display  EKG None  Radiology No results found.  Procedures Procedures   Medications Ordered in ED Medications  acetaminophen (TYLENOL) tablet 650 mg (650 mg Oral Given 05/13/21 2145)  HYDROmorphone (DILAUDID) injection 1 mg (1 mg Intramuscular Given 05/13/21 2313)  dexamethasone (DECADRON) tablet 10 mg (10 mg Oral Given 05/13/21 2311)    ED Course  I have reviewed the triage vital signs and the nursing notes.  Pertinent labs & imaging results that were available during my care of the patient were reviewed by me and considered in my medical decision making (see chart for details).    MDM Rules/Calculators/A&P                           65yo female with complicated medical history listed above presents with concern for back pain. Patient has a  normal neurologic exam and denies any urinary retention or overflow incontinence, stool incontinence, saddle anesthesia, fever, IV drug use, trauma, chronic steroid use or immunocompromise and have low suspicion suspicion for cauda equina, fracture, epidural abscess, or vertebral osteomyelitis.  No abdominal pain or tenderness, doubt intraabdominal etiology, urinary etiology, vascular etiology.  History of lifting and developing radicular pain seems consistent with disc herniation.  Given decadron, dose of pain medication in ED. Give gabapentin rx renal dosing. Patient discharged in stable condition with understanding of reasons to return.    Final Clinical Impression(s) / ED Diagnoses Final diagnoses:  Sciatica of right side  Acute bilateral low back pain with right-sided sciatica    Rx /  DC Orders ED Discharge Orders          Ordered    gabapentin (NEURONTIN) 300 MG capsule  Daily at bedtime        05/13/21 2310             Gareth Morgan, MD 05/14/21 2051

## 2021-05-20 ENCOUNTER — Emergency Department (HOSPITAL_BASED_OUTPATIENT_CLINIC_OR_DEPARTMENT_OTHER)
Admission: EM | Admit: 2021-05-20 | Discharge: 2021-05-20 | Disposition: A | Discharge status: Home / Self Care | Payer: Medicare HMO | Attending: Emergency Medicine | Admitting: Emergency Medicine

## 2021-05-20 ENCOUNTER — Emergency Department (HOSPITAL_BASED_OUTPATIENT_CLINIC_OR_DEPARTMENT_OTHER): Payer: Medicare HMO

## 2021-05-20 ENCOUNTER — Encounter (HOSPITAL_BASED_OUTPATIENT_CLINIC_OR_DEPARTMENT_OTHER): Payer: Self-pay

## 2021-05-20 ENCOUNTER — Other Ambulatory Visit: Payer: Self-pay

## 2021-05-20 DIAGNOSIS — Z8616 Personal history of COVID-19: Secondary | ICD-10-CM | POA: Insufficient documentation

## 2021-05-20 DIAGNOSIS — F17210 Nicotine dependence, cigarettes, uncomplicated: Secondary | ICD-10-CM | POA: Diagnosis not present

## 2021-05-20 DIAGNOSIS — M5441 Lumbago with sciatica, right side: Secondary | ICD-10-CM | POA: Diagnosis not present

## 2021-05-20 DIAGNOSIS — M5431 Sciatica, right side: Secondary | ICD-10-CM

## 2021-05-20 DIAGNOSIS — Z79899 Other long term (current) drug therapy: Secondary | ICD-10-CM | POA: Insufficient documentation

## 2021-05-20 DIAGNOSIS — I130 Hypertensive heart and chronic kidney disease with heart failure and stage 1 through stage 4 chronic kidney disease, or unspecified chronic kidney disease: Secondary | ICD-10-CM | POA: Diagnosis not present

## 2021-05-20 DIAGNOSIS — I5032 Chronic diastolic (congestive) heart failure: Secondary | ICD-10-CM | POA: Insufficient documentation

## 2021-05-20 DIAGNOSIS — N184 Chronic kidney disease, stage 4 (severe): Secondary | ICD-10-CM | POA: Insufficient documentation

## 2021-05-20 DIAGNOSIS — Z853 Personal history of malignant neoplasm of breast: Secondary | ICD-10-CM | POA: Insufficient documentation

## 2021-05-20 DIAGNOSIS — M545 Low back pain, unspecified: Secondary | ICD-10-CM | POA: Diagnosis present

## 2021-05-20 MED ORDER — PREDNISONE 20 MG PO TABS: 40.0000 mg | ORAL_TABLET | Freq: Every day | ORAL | 0 refills | Status: AC

## 2021-05-20 MED ORDER — TRAMADOL HCL 50 MG PO TABS: 50.0000 mg | ORAL_TABLET | Freq: Two times a day (BID) | ORAL | 0 refills | Status: AC | PRN

## 2021-05-20 MED ORDER — LIDOCAINE 4 % EX PTCH: 1.0000 | MEDICATED_PATCH | Freq: Two times a day (BID) | CUTANEOUS | 0 refills | Status: DC | PRN

## 2021-05-20 MED ORDER — HYDROMORPHONE HCL 1 MG/ML IJ SOLN
1.0000 mg | Freq: Once | INTRAMUSCULAR | Status: AC
  Administered 2021-05-20: 1 mg via INTRAMUSCULAR
  Filled 2021-05-20: qty 1

## 2021-05-20 MED ORDER — LIDOCAINE 5 % EX PTCH
1.0000 | MEDICATED_PATCH | CUTANEOUS | Status: DC
  Administered 2021-05-20: 1 via TRANSDERMAL
  Filled 2021-05-20: qty 1

## 2021-05-20 NOTE — ED Provider Notes (Signed)
Sherry [name] [facility] Provider Note   CSN: [id number] Arrival date & time: 05/20/21  1115     History Chief Complaint  Patient presents with   Back Pain    Sherry [name] is a 65 y.o. female with history of chronic low back pain, previous lumbar back surgery, hypertension.  Presents to the emergency department with a chief complaint of back pain.  Patient reports that pain has been present over the last week.  Pain is located to right lumbar back and radiates to right lower leg.  Pain is remained unchanged over this time.  Patient rates pain 8.5/10 on the pain scale.  Pain is worse with movement or touch.  Patient has had minimal improvement with taking gabapentin.  Patient denies any recent falls or traumatic injuries.  Patient denies any numbness, weakness, saddle anesthesia, bowel or bladder dysfunction, headache, visual disturbance, expected weight loss, night sweats, IV drug use, dysuria, hematuria, urinary urgency, fevers, chills.  Previous history of breast cancer 10 to 15 years prior, currently in remission.   Back Pain Associated symptoms: no abdominal pain, no chest pain, no dysuria, no fever, no headaches, no numbness and no weakness       Past Medical History:  Diagnosis Date   Alcohol abuse 05/09/2019   Alcohol use disorder, severe, dependence ([place]) 08/27/2017   Alcohol withdrawal ([place]) 08/09/2017   Anxiety    ARF (acute renal failure) ([place]) 04/09/2020   Arthropathy of lumbar facet joint 05/24/2020   Bilateral low back pain with bilateral sciatica 01/05/2019   Bipolar and related disorder ([place])    Blood transfusion without reported diagnosis    Body mass index (BMI) 40.0-44.9, adult ([place]) 07/19/2020   Breast cancer ([place])    right lumpectemy and lymph node    Cancer ([place]) 09/06/2017   Cervical spondylosis 05/24/2020   Chest pain 05/31/2020   Chest pain, rule out acute myocardial infarction 05/29/2020   CHF (congestive heart failure) (HCC)    Chronic  back pain    Chronic bilateral low back pain with right-sided sciatica 05/24/2020   Chronic diastolic CHF (congestive heart failure) ([place]) 05/09/2019   Chronic kidney disease    "Stage IV" - states due to hypertension   Chronic kidney disease, stage III (moderate) ([place]) 11/17/2016   Chronic low back pain 09/06/2017   Disc levels:      No abnormality at L2-3 or above.      L3-4:  Mild bulging of the disc.  No stenosis.      L4-5: Bilateral facet arthropathy with gaping, fluid-filled joints.   Joint edema. Anterolisthesis would likely occur at this level with   standing and flexion. Disc degeneration with mild bulging of the   disc. Mild narrowing of the lateral recesses and foramina, which   would likely worsen   Cigarette smoker 12/31/2017   CKD (chronic kidney disease) stage 4, GFR 15-29 ml/min (HCC) 04/18/2020   Clonus 05/24/2020   Cyst of ovary    Depression    Depression with anxiety 08/21/2016   Dizziness 02/24/2018   DOE (dyspnea on exertion) 12/30/2017   12/30/2017   Walked RA x one lap @ 185 stopped due to  Sob/ lightheaded weak with sats 96% at end - Spirometry 12/30/2017  FEV1 1.27 (68%)  Ratio 99 s curvature / abn effort dep portion only     Dyspnea    Dyspnea and respiratory abnormality 02/13/2013   Formatting of this note might be different from the original. STORY: PSG  on 05/20/12 showed no OSA but upper airway resistance syndrome, AHI 3.9, severe snoring was noted. ESS=8. UARS. Advise positional therapy, weight loss program, regular exercise.   Essential hypertension 07/06/2017   GERD (gastroesophageal reflux disease)    Grief reaction with prolonged bereavement 09/06/2017   Partner of 18 yrs left 2018-pt admits she still loves her   Hepatitis C    recieved treatment for Hep C   Hepatitis C virus infection cured after antiviral drug therapy 12/17/2012   Telephone Encounter - Dub Mikes, RN - 12/28/2016 9:54 AM EDT Patient called for HCV RNA test results. EOT 08-26-16 - not detected. 12 week  post treatment 12-11-16 - not detected. Informed patient that she was cured. Patient verbalized understanding.          History of breast cancer 02/13/2013   Hx of bipolar disorder 01/31/2016   Hypertension    Hypertensive emergency 10/21/2017   Impaired functional mobility, balance, gait, and endurance 01/09/2021   Left-sided weakness    MDD (major depressive disorder), recurrent severe, without psychosis ([place])    Medication intolerance 09/06/2017   Remeron-appetite stimulant/Dizziness/Dysphoria   Microcytic anemia 10/21/2017   Morbid obesity due to excess calories ([place]) 02/13/2013   BMI 42.9   MRSA infection    of breast incision   Nausea 04/09/2020   Formatting of this note might be different from the original. Added automatically from request for surgery [id number]   Near syncope 05/29/2020   Neck pain 05/24/2020   Neuroleptic-induced tardive dyskinesia 09/06/2017   Abilify   Opioid dependence ([place])    Has been treated in [facility] in past   Opioid use disorder, severe, dependence ([place]) 08/27/2017   Oral aphthous ulcer 06/25/2015   Last Assessment & Plan:  Formatting of this note might be different from the original. - Appear to be resolving as a be expected in the case of aphthous stomatitis. - Continue topical therapy with viscous lidocaine.  Recommended trying high-dose anti-inflammatory such as 600 mg of ibuprofen as needed. - She'll return to ENT clinic if symptoms do not resolve.   Phlebitis after infusion 10/28/2017   Rt antecubital fossa   Pre-operative cardiovascular examination 06/13/2018   Restless legs syndrome 02/13/2013   Formatting of this note might be different from the original. IMPRESSION: Possible. Will follow.   Spondylolisthesis, lumbar region 10/21/2020   Subacute dyskinesia due to drug 02/13/2013   Formatting of this note might be different from the original. STORY: Tardive dyskinesia and mild limb dyskinesia, LE>UE which likely due to prolonged antipsychotic used, abilify.  Couldn't tolerate artane, gabapentin, clonazepam and xenazine.`E1o3L`IMPRESSION: Well tolerated depakote 250mg  bid, mood aspect is better as well. Will increase to 500mg  bid. ADR was discussed.  RTC 4-6 weeks.   Substance abuse ([place])    Substance induced mood disorder ([place]) 08/13/2017   TIA (transient ischemic attack) 2020   P/w BP >230/120 and neurologic symptoms, presumed TIA    Patient Active Problem List   Diagnosis Date Noted   COVID-19 virus infection 04/05/2021   Hypertensive urgency 04/04/2021   Tardive dyskinesia 04/04/2021   Hyperkalemia 04/03/2021   Impaired functional mobility, balance, gait, and endurance 01/09/2021   Spondylolisthesis, lumbar region 10/21/2020   Anxiety    Bipolar and related disorder ([place])    Blood transfusion without reported diagnosis    Breast cancer ([place])    CHF (congestive heart failure) ([place])    Chronic back pain    Chronic kidney disease    Depression  Dyspnea    Hepatitis C    MRSA infection    Opioid dependence (HCC)    Substance abuse ([place])    Body mass index (BMI) 40.0-44.9, adult ([place]) 07/19/2020   Chest pain 05/31/2020   Near syncope 05/29/2020   Chest pain, rule out acute myocardial infarction 05/29/2020   Cervical spondylosis 05/24/2020   Clonus 05/24/2020   Neck pain 05/24/2020   Chronic bilateral low back pain with right-sided sciatica 05/24/2020   Arthropathy of lumbar facet joint 05/24/2020   CKD (chronic kidney disease) stage 4, GFR 15-29 ml/min (HCC) 04/18/2020   ARF (acute renal failure) ([place]) 04/09/2020   Nausea 04/09/2020   Chronic diastolic CHF (congestive heart failure) ([place]) 05/09/2019   GERD (gastroesophageal reflux disease) 05/09/2019   Alcohol abuse 05/09/2019   Hypertension 05/08/2019   Bilateral low back pain with bilateral sciatica 01/05/2019   TIA (transient ischemic attack) 2020   Cyst of ovary    Pre-operative cardiovascular examination 06/13/2018   Dizziness 02/24/2018   Left-sided weakness  02/24/2018   Cigarette smoker 12/31/2017   DOE (dyspnea on exertion) 12/30/2017   Phlebitis after infusion 10/28/2017   Hypertensive emergency 10/21/2017   Microcytic anemia 10/21/2017   Medication intolerance 09/06/2017   Neuroleptic-induced tardive dyskinesia 09/06/2017   Cancer ([place]) 09/06/2017   Chronic low back pain 09/06/2017   Grief reaction with prolonged bereavement 09/06/2017   Opioid use disorder, severe, dependence ([place]) 08/27/2017   Alcohol use disorder, severe, dependence ([place]) 08/27/2017   Substance induced mood disorder ([place]) 08/13/2017   MDD (major depressive disorder), recurrent severe, without psychosis ([place])    Alcohol withdrawal ([place]) 08/09/2017   Essential hypertension 07/06/2017   Chronic kidney disease, stage III (moderate) ([place]) 11/17/2016   Depression with anxiety 08/21/2016   Hx of bipolar disorder 01/31/2016   Oral aphthous ulcer 06/25/2015   History of breast cancer 02/13/2013   Morbid obesity due to excess calories ([place]) 02/13/2013   Restless legs syndrome 02/13/2013   Subacute dyskinesia due to drug 02/13/2013   Dyspnea and respiratory abnormality 02/13/2013   Hepatitis C virus infection cured after antiviral drug therapy 12/17/2012    Past Surgical History:  Procedure Laterality Date   AV FISTULA PLACEMENT Right 05/01/2021   Procedure: RIGHT ARTERIOVENOUS (AV) FISTULA CREATION;  Surgeon: Broadus John, MD;  Location: [facility];  Service: Vascular;  Laterality: Right;  [place]   BREAST SURGERY Right 2010   "breast cancer survivor" - states partial mastectomy and nodes   COLONOSCOPY     [facility]   ESOPHAGOGASTRODUODENOSCOPY     [facility]   ESOPHAGOGASTRODUODENOSCOPY  04/18/2020   [facility]   LAPAROSCOPIC CHOLECYSTECTOMY  2002   LAPAROSCOPIC INCISIONAL / UMBILICAL / [place]  04/13/2018   with BARD 15x 02OV mesh (supraumbilical)   LAPAROSCOPIC LYSIS OF ADHESIONS  07/12/2018    Procedure: LAPAROSCOPIC LYSIS OF ADHESIONS;  Surgeon: Isabel Caprice, MD;  Location: [facility] ORS;  Service: Gynecology;;   MULTIPLE TOOTH EXTRACTIONS     MYOMECTOMY     x 2 prior to hysterectomy   ROBOTIC ASSISTED BILATERAL SALPINGO OOPHERECTOMY Right 07/12/2018   Procedure: XI ROBOTIC ASSISTED RIGHT SALPINGO OOPHORECTOMY;  Surgeon: Isabel Caprice, MD;  Location: [facility] ORS;  Service: Gynecology;  Laterality: Right;   TOTAL ABDOMINAL HYSTERECTOMY     fibroids    UPPER GASTROINTESTINAL ENDOSCOPY     WISDOM TOOTH EXTRACTION       OB History  Gravida  2   Sherry  0   Term      Preterm      AB  0   Living         SAB      IAB      Ectopic      Multiple      Live Births              Family History  Problem Relation Age of Onset   Heart attack Mother    Breast cancer Mother 31   Dementia Mother    Cancer Father 67       died of bleeding from kidneys   Colon cancer Neg Hx    Esophageal cancer Neg Hx    Stomach cancer Neg Hx    Rectal cancer Neg Hx     Social History   Tobacco Use   Smoking status: Every Day    Packs/day: 0.50    Types: Cigarettes   Smokeless tobacco: Never  Vaping Use   Vaping Use: Former   Start date: 06/27/2013   Quit date: 06/14/2017   Devices: Apple Cinnamon-0 mg  Substance Use Topics   Alcohol use: Not Currently    Comment: hx over 30 years   Drug use: Not Currently    Comment: h/o IVD use    Home Medications Prior to Admission medications   Medication Sig Start Date End Date Taking? Authorizing Provider  albuterol (VENTOLIN HFA) 108 (90 Base) MCG/ACT inhaler Inhale 2 puffs into the lungs every 4 (four) hours as needed. 04/23/21   [provider]  amLODipine (NORVASC) 10 MG tablet Take 10 mg by mouth in the morning.    [provider]  Cholecalciferol (VITAMIN D3) 50 MCG (2000 UT) capsule Take 1 capsule (2,000 Units total) by mouth daily. Patient not taking: No sig reported 09/26/20   Wendie Agreste, MD   docusate sodium (COLACE) 100 MG capsule Take 100 mg by mouth in the morning.    [provider]  ferrous sulfate 325 (65 FE) MG tablet Take 325 mg by mouth in the morning and at bedtime.    [provider]  FLUoxetine (PROZAC) 40 MG capsule Take 40 mg by mouth in the morning. 11/09/18   [provider]  furosemide (LASIX) 40 MG tablet Take 40 mg by mouth daily. 04/30/21   [provider]  gabapentin (NEURONTIN) 300 MG capsule Take 1 capsule (300 mg total) by mouth at bedtime. 05/13/21 06/12/21  Gareth Morgan, MD  hydrALAZINE (APRESOLINE) 50 MG tablet Take 100 mg by mouth 3 (three) times daily. 03/24/21   [provider]  INGREZZA 80 MG CAPS Take 80 mg by mouth at bedtime. 10/15/17   [provider]  isosorbide mononitrate (IMDUR) 60 MG 24 hr tablet Take 60 mg by mouth in the morning.    [provider]  labetalol (NORMODYNE) 300 MG tablet Take 300 mg by mouth 2 (two) times daily.    [provider]  Melatonin 10 MG TABS Take by mouth.    [provider]  naloxone [facility]) nasal spray 4 mg/0.1 mL SMARTSIG:Both Nares 02/25/21   [provider]  ondansetron (ZOFRAN-ODT) 4 MG disintegrating tablet Take 4 mg by mouth every 8 (eight) hours as needed for vomiting or nausea. 03/26/21   [provider]  oxyCODONE-acetaminophen (PERCOCET) 10-325 MG tablet Take 1 tablet by mouth in the morning and at bedtime. 03/24/21   [provider]  oxyCODONE-acetaminophen (  PERCOCET/ROXICET) 5-325 MG tablet Take 1-2 tablets by mouth every 4 (four) hours as needed for severe pain. 05/04/21   Angelia Mould, MD  pantoprazole (PROTONIX) 40 MG tablet Take 40 mg by mouth daily. 04/05/21   [provider]    Allergies    Abilify [aripiprazole], Remeron [mirtazapine], Trazodone and nefazodone, Flexeril [cyclobenzaprine], and Amoxicillin  Review of Systems   Review of Systems  Constitutional:  Negative for  chills and fever.  Eyes:  Negative for visual disturbance.  Respiratory:  Negative for shortness of breath.   Cardiovascular:  Negative for chest pain.  Gastrointestinal:  Negative for abdominal pain, nausea and vomiting.  Genitourinary:  Negative for difficulty urinating, dysuria, enuresis, frequency, hematuria and urgency.  Musculoskeletal:  Positive for back pain. Negative for neck pain.  Skin:  Negative for color change, rash and wound.  Neurological:  Negative for dizziness, tremors, seizures, syncope, facial asymmetry, speech difficulty, weakness, light-headedness, numbness and headaches.  Psychiatric/Behavioral:  Negative for confusion.    Physical Exam Updated Vital Signs BP (!) 193/91 (BP Location: Left Arm)   Pulse 67   Temp 98.3 F (36.8 C) (Oral)   Resp 20   Ht 5\' 2"  (1.575 m)   Wt 93.4 kg   SpO2 100%   BMI 37.68 kg/m   Physical Exam Vitals and nursing note reviewed.  Constitutional:      General: She is not in acute distress.    Appearance: She is not ill-appearing, toxic-appearing or diaphoretic.  Eyes:     General: No scleral icterus.       Right eye: No discharge.        Left eye: No discharge.     Extraocular Movements: Extraocular movements intact.     Conjunctiva/sclera: Conjunctivae normal.     Pupils: Pupils are equal, round, and reactive to light.  Cardiovascular:     Rate and Rhythm: Normal rate.  Pulmonary:     Effort: Pulmonary effort is normal.  Musculoskeletal:     Cervical back: Normal, normal range of motion and neck supple. No rigidity.     Thoracic back: No swelling, edema, deformity, signs of trauma, lacerations, spasms, tenderness or bony tenderness.     Lumbar back: Tenderness present. No swelling, edema, deformity, signs of trauma, lacerations, spasms or bony tenderness. Positive right straight leg raise test.     Comments: No midline tenderness or deformity to cervical, thoracic, or lumbar spine.  Tenderness to right lumbar back.   Skin:    General: Skin is warm and dry.  Neurological:     General: No focal deficit present.     Mental Status: She is alert and oriented to person, place, and time.     GCS: GCS eye subscore is 4. GCS verbal subscore is 5. GCS motor subscore is 6.     Cranial Nerves: Cranial nerves 2-12 are intact. No cranial nerve deficit, dysarthria or facial asymmetry.     Sensory: Sensation is intact.     Motor: No weakness, tremor, seizure activity or pronator drift.     Coordination: Finger-Nose-Finger Test normal.     Gait: Gait is intact. Gait normal.     Comments: CN II-XII intact, equal grip strength, +5 strength to bilateral upper and lower extremities, sensation to light touch intact to bilateral upper and lower extremities.  Psychiatric:        Behavior: Behavior is cooperative.    ED Results / Procedures / Treatments   Labs (all labs ordered are listed,  but only abnormal results are displayed) Labs Reviewed - No data to display  EKG None  Radiology CT Lumbar Spine Wo Contrast  Result Date: 05/20/2021 CLINICAL DATA:  Increased right lower back pain for 1 week. No known injury. Chronic low back pain with previous back surgery. EXAM: CT LUMBAR SPINE WITHOUT CONTRAST TECHNIQUE: Multidetector CT imaging of the lumbar spine was performed without intravenous contrast administration. Multiplanar CT image reconstructions were also generated. COMPARISON:  Abdominopelvic CT 12/19/2020, lumbar spine radiographs 10/21/2020 and 01/21/2021, lumbar MRI 07/02/2020. FINDINGS: Segmentation: There are 5 lumbar type vertebral bodies. Alignment: Stable and near anatomic. Vertebrae: Since previous MRI, the patient has undergone posterior lumbar and interbody fusion at L4-5. The hardware is intact without loosening. There is evidence of solid interbody fusion. No evidence of acute fracture or pars defect. Sacroiliac joint degenerative changes are present bilaterally. Paraspinal and other soft tissues: No  significant paraspinal abnormalities are identified. There is aortic and branch vessel atherosclerosis. A left renal cyst is partially imaged. The scout image demonstrates mildly dilated loops of small bowel with gas throughout the colon, suggesting an ileus. Disc levels: From T12-L1 through L2-3, the disc height is maintained. There is no evidence of large disc herniation or significant spinal stenosis. L3-4: Evaluation mildly limited by beam hardening artifact from the surgical hardware. Grossly stable loss of disc height with mild disc bulging and endplate osteophyte formation asymmetric to the right. Stable mild narrowing of the lateral recesses and foramina. L4-5: Interval right laminectomy, partial facetectomy and PLIF. There is improved patency of the spinal canal. No significant osseous foraminal narrowing. No gross paraspinal abnormality allowing for artifact associated with the surgical hardware. L5-S1: Grossly stable chronic degenerative disc disease with annular disc bulging and endplate osteophytes. Mild bilateral facet hypertrophy. Stable mild chronic lateral recess and foraminal narrowing bilaterally. IMPRESSION: 1. Interval posterior decompression and PLIF at L4-5 without demonstrated complication. 2. Chronic spondylosis at L5-S1 with disc bulging and endplate osteophytes contributing to stable mild chronic lateral recess and foraminal narrowing bilaterally. 3. No acute spinal findings demonstrated. 4. Mild bowel distension, suggesting an ileus. 5.  Aortic Atherosclerosis (ICD10-I70.0). Electronically Signed   By: Richardean Sale M.D.   On: 05/20/2021 14:37    Procedures Procedures   Medications Ordered in ED Medications  lidocaine (LIDODERM) 5 % 1 patch (1 patch Transdermal Patch Applied 05/20/21 1421)  HYDROmorphone (DILAUDID) injection 1 mg (1 mg Intramuscular Given 05/20/21 1528)    ED Course  I have reviewed the triage vital signs and the nursing notes.  Pertinent labs & imaging  results that were available during my care of the patient were reviewed by me and considered in my medical decision making (see chart for details).    MDM Rules/Calculators/A&P                           65 year old female no acute distress, nontoxic appearing.  Presents with chief complaint of right-sided back pain with radiation to right lower leg.  Patient has history of chronic back pain and previous lumbar/sacral surgery.  Neuro exam is reassuring.  Patient denies any fevers, chills, numbness, weakness, saddle anesthesia, bowel bladder dysfunction, IV drug use, recent trauma, chronic steroid use, amount of compromised status.  Low suspicion for cauda equina syndrome, epidural abscess, or osteomyelitis.  Due to patient's history of back surgery and continued back pain will obtain CT imaging for further evaluation.  We will plan to give patient pain medication while  in emergency department for pain control.  Patient was noted to be hypertensive this is likely secondary to complaints of pain.  Per chart review patient was seen on 11/1 for similar symptoms.  Patient received dexamethasone as well as Dilaudid injection while in emergency department.  Patient was prescribed gabapentin.  CT imaging shows interval posterior decompression and PLIF at L4-L5 without demonstrated complication.  Chronic spondylosis at L5-S1 with disc bulging and endplate osteophytes contributing to stable mild chronic lateral recesses and foraminal narrowing bilaterally.  No acute spinal findings.  Mild bowel distention suggesting ileus.  Low suspicion for ileus as patient denies any constipation.    Patient is able to stand and ambulate without difficulty.  We will give patient a short course of tramadol for her pain as well as short course of prednisone.  We will have patient follow-up with her neurosurgeon for further assessment.  Patient to follow-up with her primary care provider for repeat assessment of blood  pressure.  Discussed results, findings, treatment and follow up. Patient advised of return precautions. Patient verbalized understanding and agreed with plan.   Final Clinical Impression(s) / ED Diagnoses Final diagnoses:  Acute right-sided low back pain with right-sided sciatica  Sciatica of right side    Rx / DC Orders ED Discharge Orders          Ordered    traMADol (ULTRAM) 50 MG tablet  Every 12 hours PRN        05/20/21 1503    Lidocaine (HM LIDOCAINE PATCH) 4 % PTCH  Every 12 hours PRN        05/20/21 1503    predniSONE (DELTASONE) 20 MG tablet  Daily        05/20/21 1503             Dyann Ruddle 05/20/21 1535    Regan Lemming, MD 05/20/21 1642

## 2021-05-20 NOTE — ED Notes (Signed)
Patient transported to X-ray 

## 2021-05-20 NOTE — ED Triage Notes (Signed)
Pt c/o lower back pain x 1 week-denies injury-states she was recently seen here for same-states she has hx chronic back pain-slow stead gait

## 2021-05-20 NOTE — ED Notes (Signed)
Pt walked out to car , family member gave ride home after medication administered.  Pt ambulating with steady gait.

## 2021-05-20 NOTE — Discharge Instructions (Addendum)
You came to the emergency department today to be assessed for your back pain.  Your physical exam was reassuring.  The CT scan of your back showed that you have bulging disks.  Due to this I have given you a short course of tramadol and prednisone.  Please take these medications as prescribed.  Please follow-up with your neurosurgeon for further assessment.  While in the emergency department your blood pressure was found to be elevated.  Please follow-up with your primary care doctor for repeat assessment.  Today you received medications that may make you sleepy or impair your ability to make decisions.  For the next 24 hours please do not drive, operate heavy machinery, care for a small child with out another adult present, or perform any activities that may cause harm to you or someone else if you were to fall asleep or be impaired.   You are being prescribed a medication which may make you sleepy. Please follow up of listed precautions for at least 24 hours after taking one dose.   Get help right away if: You are not able to control when you urinate or have bowel movements (incontinence). You have: Weakness in your lower back, pelvis, buttocks, or legs that gets worse. Redness or swelling of your back. A burning sensation when you urinate.

## 2021-05-21 ENCOUNTER — Other Ambulatory Visit: Payer: Self-pay

## 2021-05-21 DIAGNOSIS — N184 Chronic kidney disease, stage 4 (severe): Secondary | ICD-10-CM

## 2021-05-27 ENCOUNTER — Other Ambulatory Visit: Payer: Self-pay

## 2021-05-27 ENCOUNTER — Encounter (HOSPITAL_COMMUNITY): Payer: Self-pay

## 2021-05-27 ENCOUNTER — Emergency Department (HOSPITAL_COMMUNITY)
Admission: EM | Admit: 2021-05-27 | Discharge: 2021-05-27 | Disposition: A | Discharge status: Home / Self Care | Payer: Medicare HMO | Attending: Emergency Medicine | Admitting: Emergency Medicine

## 2021-05-27 DIAGNOSIS — F1020 Alcohol dependence, uncomplicated: Secondary | ICD-10-CM | POA: Diagnosis not present

## 2021-05-27 DIAGNOSIS — Z79899 Other long term (current) drug therapy: Secondary | ICD-10-CM | POA: Insufficient documentation

## 2021-05-27 DIAGNOSIS — Z8616 Personal history of COVID-19: Secondary | ICD-10-CM | POA: Insufficient documentation

## 2021-05-27 DIAGNOSIS — F17210 Nicotine dependence, cigarettes, uncomplicated: Secondary | ICD-10-CM | POA: Diagnosis not present

## 2021-05-27 DIAGNOSIS — Y900 Blood alcohol level of less than 20 mg/100 ml: Secondary | ICD-10-CM | POA: Insufficient documentation

## 2021-05-27 DIAGNOSIS — I5032 Chronic diastolic (congestive) heart failure: Secondary | ICD-10-CM | POA: Insufficient documentation

## 2021-05-27 DIAGNOSIS — I130 Hypertensive heart and chronic kidney disease with heart failure and stage 1 through stage 4 chronic kidney disease, or unspecified chronic kidney disease: Secondary | ICD-10-CM | POA: Diagnosis not present

## 2021-05-27 DIAGNOSIS — F10230 Alcohol dependence with withdrawal, uncomplicated: Secondary | ICD-10-CM

## 2021-05-27 DIAGNOSIS — N184 Chronic kidney disease, stage 4 (severe): Secondary | ICD-10-CM | POA: Insufficient documentation

## 2021-05-27 DIAGNOSIS — Z853 Personal history of malignant neoplasm of breast: Secondary | ICD-10-CM | POA: Diagnosis not present

## 2021-05-27 LAB — RAPID URINE DRUG SCREEN, HOSP PERFORMED
Amphetamines: NOT DETECTED
Barbiturates: NOT DETECTED
Benzodiazepines: NOT DETECTED
Cocaine: NOT DETECTED
Opiates: NOT DETECTED
Tetrahydrocannabinol: NOT DETECTED

## 2021-05-27 LAB — COMPREHENSIVE METABOLIC PANEL
ALT: 9 U/L (ref 0–44)
AST: 12 U/L — ABNORMAL LOW (ref 15–41)
Albumin: 3.3 g/dL — ABNORMAL LOW (ref 3.5–5.0)
Alkaline Phosphatase: 83 U/L (ref 38–126)
Anion gap: 11 (ref 5–15)
BUN: 54 mg/dL — ABNORMAL HIGH (ref 8–23)
CO2: 17 mmol/L — ABNORMAL LOW (ref 22–32)
Calcium: 8.4 mg/dL — ABNORMAL LOW (ref 8.9–10.3)
Chloride: 108 mmol/L (ref 98–111)
Creatinine, Ser: 3.94 mg/dL — ABNORMAL HIGH (ref 0.44–1.00)
GFR, Estimated: 12 mL/min — ABNORMAL LOW (ref >60)
Glucose, Bld: 96 mg/dL (ref 70–99)
Potassium: 4.5 mmol/L (ref 3.5–5.1)
Sodium: 136 mmol/L (ref 135–145)
Total Bilirubin: 0.3 mg/dL (ref 0.3–1.2)
Total Protein: 6.7 g/dL (ref 6.5–8.1)

## 2021-05-27 LAB — CBC
HCT: 28.4 % — ABNORMAL LOW (ref 36.0–46.0)
Hemoglobin: 8.5 g/dL — ABNORMAL LOW (ref 12.0–15.0)
MCH: 28.2 pg (ref 26.0–34.0)
MCHC: 29.9 g/dL — ABNORMAL LOW (ref 30.0–36.0)
MCV: 94.4 fL (ref 80.0–100.0)
Platelets: 227 10*3/uL (ref 150–400)
RBC: 3.01 MIL/uL — ABNORMAL LOW (ref 3.87–5.11)
RDW: 15.7 % — ABNORMAL HIGH (ref 11.5–15.5)
WBC: 6.3 10*3/uL (ref 4.0–10.5)
nRBC: 0 % (ref 0.0–0.2)

## 2021-05-27 LAB — ETHANOL: Alcohol, Ethyl (B): <10 mg/dL (ref <10)

## 2021-05-27 MED ORDER — LORAZEPAM 2 MG/ML IJ SOLN
2.0000 mg | Freq: Once | INTRAMUSCULAR | Status: AC
  Administered 2021-05-27: 2 mg via INTRAMUSCULAR
  Filled 2021-05-27: qty 1

## 2021-05-27 MED ORDER — OXYCODONE-ACETAMINOPHEN 5-325 MG PO TABS
1.0000 | ORAL_TABLET | Freq: Once | ORAL | Status: AC
  Administered 2021-05-27: 1 via ORAL
  Filled 2021-05-27: qty 1

## 2021-05-27 MED ORDER — CHLORDIAZEPOXIDE HCL 25 MG PO CAPS
100.0000 mg | ORAL_CAPSULE | Freq: Once | ORAL | Status: AC
  Administered 2021-05-27: 100 mg via ORAL
  Filled 2021-05-27: qty 4

## 2021-05-27 MED ORDER — CHLORDIAZEPOXIDE HCL 25 MG PO CAPS: ORAL_CAPSULE | ORAL | 0 refills | Status: DC

## 2021-05-27 NOTE — Discharge Instructions (Addendum)
Follow up with a rehab facility in the morning to try and start rehab.  Take the medication as prescribed.  You may have to drink to stop from going into withdrawal.  Please return for hallucinations, seizures.

## 2021-05-27 NOTE — ED Triage Notes (Signed)
Pt arrives for alcohol detox. States she drinks "as much as I can get my hands on every day". Last alcohol intake approximately one hour PTA. Pt hands exhibiting tremors during triage. No hx of DT's per pt. Pt endorses percocet use daily as well. States "I'm using them a little more than they're prescribed".

## 2021-05-27 NOTE — ED Provider Notes (Signed)
[facility] EMERGENCY DEPARTMENT Provider Note   CSN: [id number] Arrival date & time: 05/27/21  1049     History Chief Complaint  Patient presents with   Alcohol Detox    Sherry [name] is a 65 y.o. female.  65 yo F with a chief complaints of requesting alcohol detox.  The patient drinks about 1/5 of alcohol every day.  Drink reportedly just prior to arrival.  She would like to stay until we are able to get her off of alcohol.  She has a history of heart failure and renal disease.  She is getting closer to starting dialysis.  She has had trouble trying to stop drinking before.  Typically gets tremulous with it.  Denies history of seizures or hallucinations.  The history is provided by the patient.  Illness Severity:  Moderate Onset quality:  Gradual Duration:  2 hours Timing:  Constant Progression:  Unchanged Chronicity:  New Associated symptoms: no chest pain, no congestion, no fever, no headaches, no myalgias, no nausea, no rhinorrhea, no shortness of breath, no vomiting and no wheezing       Past Medical History:  Diagnosis Date   Alcohol abuse 05/09/2019   Alcohol use disorder, severe, dependence ([place]) 08/27/2017   Alcohol withdrawal ([place]) 08/09/2017   Anxiety    ARF (acute renal failure) ([place]) 04/09/2020   Arthropathy of lumbar facet joint 05/24/2020   Bilateral low back pain with bilateral sciatica 01/05/2019   Bipolar and related disorder ([place])    Blood transfusion without reported diagnosis    Body mass index (BMI) 40.0-44.9, adult ([place]) 07/19/2020   Breast cancer ([place])    right lumpectemy and lymph node    Cancer ([place]) 09/06/2017   Cervical spondylosis 05/24/2020   Chest pain 05/31/2020   Chest pain, rule out acute myocardial infarction 05/29/2020   CHF (congestive heart failure) (HCC)    Chronic back pain    Chronic bilateral low back pain with right-sided sciatica 05/24/2020   Chronic diastolic CHF (congestive heart failure) ([place]) 05/09/2019    Chronic kidney disease    "Stage IV" - states due to hypertension   Chronic kidney disease, stage III (moderate) ([place]) 11/17/2016   Chronic low back pain 09/06/2017   Disc levels:      No abnormality at L2-3 or above.      L3-4:  Mild bulging of the disc.  No stenosis.      L4-5: Bilateral facet arthropathy with gaping, fluid-filled joints.   Joint edema. Anterolisthesis would likely occur at this level with   standing and flexion. Disc degeneration with mild bulging of the   disc. Mild narrowing of the lateral recesses and foramina, which   would likely worsen   Cigarette smoker 12/31/2017   CKD (chronic kidney disease) stage 4, GFR 15-29 ml/min (HCC) 04/18/2020   Clonus 05/24/2020   Cyst of ovary    Depression    Depression with anxiety 08/21/2016   Dizziness 02/24/2018   DOE (dyspnea on exertion) 12/30/2017   12/30/2017   Walked RA x one lap @ 185 stopped due to  Sob/ lightheaded weak with sats 96% at end - Spirometry 12/30/2017  FEV1 1.27 (68%)  Ratio 99 s curvature / abn effort dep portion only     Dyspnea    Dyspnea and respiratory abnormality 02/13/2013   Formatting of this note might be different from the original. STORY: PSG on 05/20/12 showed no OSA but upper airway resistance syndrome, AHI 3.9, severe snoring was noted.  ESS=8. UARS. Advise positional therapy, weight loss program, regular exercise.   Essential hypertension 07/06/2017   GERD (gastroesophageal reflux disease)    Grief reaction with prolonged bereavement 09/06/2017   Partner of 18 yrs left 2018-pt admits she still loves her   Hepatitis C    recieved treatment for Hep C   Hepatitis C virus infection cured after antiviral drug therapy 12/17/2012   Telephone Encounter - Dub Mikes, RN - 12/28/2016 9:54 AM EDT Patient called for HCV RNA test results. EOT 08-26-16 - not detected. 12 week post treatment 12-11-16 - not detected. Informed patient that she was cured. Patient verbalized understanding.          History of breast cancer 02/13/2013    Hx of bipolar disorder 01/31/2016   Hypertension    Hypertensive emergency 10/21/2017   Impaired functional mobility, balance, gait, and endurance 01/09/2021   Left-sided weakness    MDD (major depressive disorder), recurrent severe, without psychosis ([place])    Medication intolerance 09/06/2017   Remeron-appetite stimulant/Dizziness/Dysphoria   Microcytic anemia 10/21/2017   Morbid obesity due to excess calories ([place]) 02/13/2013   BMI 42.9   MRSA infection    of breast incision   Nausea 04/09/2020   Formatting of this note might be different from the original. Added automatically from request for surgery [id number]   Near syncope 05/29/2020   Neck pain 05/24/2020   Neuroleptic-induced tardive dyskinesia 09/06/2017   Abilify   Opioid dependence ([place])    Has been [facility] in [facility] in past   Opioid use disorder, severe, dependence ([place]) 08/27/2017   Oral aphthous ulcer 06/25/2015   Last Assessment & Plan:  Formatting of this note might be different from the original. - Appear to be resolving as a be expected in the case of aphthous stomatitis. - Continue topical therapy with viscous lidocaine.  Recommended trying high-dose anti-inflammatory such as 600 mg of ibuprofen as needed. - She'll return to ENT clinic if symptoms do not resolve.   Phlebitis after infusion 10/28/2017   Rt antecubital fossa   Pre-operative cardiovascular examination 06/13/2018   Restless legs syndrome 02/13/2013   Formatting of this note might be different from the original. IMPRESSION: Possible. Will follow.   Spondylolisthesis, lumbar region 10/21/2020   Subacute dyskinesia due to drug 02/13/2013   Formatting of this note might be different from the original. STORY: Tardive dyskinesia and mild limb dyskinesia, LE>UE which likely due to prolonged antipsychotic used, abilify. Couldn't tolerate artane, gabapentin, clonazepam and xenazine.`E1o3L`IMPRESSION: Well tolerated depakote 250mg  bid, mood aspect is better as well. Will  increase to 500mg  bid. ADR was discussed.  RTC 4-6 weeks.   Substance abuse ([place])    Substance induced mood disorder ([place]) 08/13/2017   TIA (transient ischemic attack) 2020   P/w BP >230/120 and neurologic symptoms, presumed TIA    Patient Active Problem List   Diagnosis Date Noted   COVID-19 virus infection 04/05/2021   Hypertensive urgency 04/04/2021   Tardive dyskinesia 04/04/2021   Hyperkalemia 04/03/2021   Impaired functional mobility, balance, gait, and endurance 01/09/2021   Spondylolisthesis, lumbar region 10/21/2020   Anxiety    Bipolar and related disorder ([place])    Blood transfusion without reported diagnosis    Breast cancer ([place])    CHF (congestive heart failure) (HCC)    Chronic back pain    Chronic kidney disease    Depression    Dyspnea    Hepatitis C    MRSA infection    Opioid dependence (  [place])    Substance abuse ([place])    Body mass index (BMI) 40.0-44.9, adult ([place]) 07/19/2020   Chest pain 05/31/2020   Near syncope 05/29/2020   Chest pain, rule out acute myocardial infarction 05/29/2020   Cervical spondylosis 05/24/2020   Clonus 05/24/2020   Neck pain 05/24/2020   Chronic bilateral low back pain with right-sided sciatica 05/24/2020   Arthropathy of lumbar facet joint 05/24/2020   CKD (chronic kidney disease) stage 4, GFR 15-29 ml/min (HCC) 04/18/2020   ARF (acute renal failure) ([place]) 04/09/2020   Nausea 04/09/2020   Chronic diastolic CHF (congestive heart failure) ([place]) 05/09/2019   GERD (gastroesophageal reflux disease) 05/09/2019   Alcohol abuse 05/09/2019   Hypertension 05/08/2019   Bilateral low back pain with bilateral sciatica 01/05/2019   TIA (transient ischemic attack) 2020   Cyst of ovary    Pre-operative cardiovascular examination 06/13/2018   Dizziness 02/24/2018   Left-sided weakness 02/24/2018   Cigarette smoker 12/31/2017   DOE (dyspnea on exertion) 12/30/2017   Phlebitis after infusion 10/28/2017   Hypertensive emergency 10/21/2017    Microcytic anemia 10/21/2017   Medication intolerance 09/06/2017   Neuroleptic-induced tardive dyskinesia 09/06/2017   Cancer ([place]) 09/06/2017   Chronic low back pain 09/06/2017   Grief reaction with prolonged bereavement 09/06/2017   Opioid use disorder, severe, dependence ([place]) 08/27/2017   Alcohol use disorder, severe, dependence ([place]) 08/27/2017   Substance induced mood disorder ([place]) 08/13/2017   MDD (major depressive disorder), recurrent severe, without psychosis ([place])    Alcohol withdrawal ([place]) 08/09/2017   Essential hypertension 07/06/2017   Chronic kidney disease, stage III (moderate) ([place]) 11/17/2016   Depression with anxiety 08/21/2016   Hx of bipolar disorder 01/31/2016   Oral aphthous ulcer 06/25/2015   History of breast cancer 02/13/2013   Morbid obesity due to excess calories ([place]) 02/13/2013   Restless legs syndrome 02/13/2013   Subacute dyskinesia due to drug 02/13/2013   Dyspnea and respiratory abnormality 02/13/2013   Hepatitis C virus infection cured after antiviral drug therapy 12/17/2012    Past Surgical History:  Procedure Laterality Date   AV FISTULA PLACEMENT Right 05/01/2021   Procedure: RIGHT ARTERIOVENOUS (AV) FISTULA CREATION;  Surgeon: Broadus John, MD;  Location: [facility] OR;  Service: Vascular;  Laterality: Right;  [place]   BREAST SURGERY Right 2010   "breast cancer survivor" - states partial mastectomy and nodes   COLONOSCOPY     [facility]   ESOPHAGOGASTRODUODENOSCOPY     [facility]   ESOPHAGOGASTRODUODENOSCOPY  04/18/2020   [facility]   LAPAROSCOPIC CHOLECYSTECTOMY  2002   LAPAROSCOPIC INCISIONAL / UMBILICAL / [place]  04/13/2018   with BARD 15x 87OM mesh (supraumbilical)   LAPAROSCOPIC LYSIS OF ADHESIONS  07/12/2018   Procedure: LAPAROSCOPIC LYSIS OF ADHESIONS;  Surgeon: Isabel Caprice, MD;  Location: [facility] ORS;  Service: Gynecology;;   MULTIPLE TOOTH EXTRACTIONS      MYOMECTOMY     x 2 prior to hysterectomy   ROBOTIC ASSISTED BILATERAL SALPINGO OOPHERECTOMY Right 07/12/2018   Procedure: XI ROBOTIC ASSISTED RIGHT SALPINGO OOPHORECTOMY;  Surgeon: Isabel Caprice, MD;  Location: [facility] ORS;  Service: Gynecology;  Laterality: Right;   TOTAL ABDOMINAL HYSTERECTOMY     fibroids    UPPER GASTROINTESTINAL ENDOSCOPY     WISDOM TOOTH EXTRACTION       OB History     Gravida  2   Para  0   Term  Preterm      AB  0   Living         SAB      IAB      Ectopic      Multiple      Live Births              Family History  Problem Relation Age of Onset   Heart attack Mother    Breast cancer Mother 81   Dementia Mother    Cancer Father 21       died of bleeding from kidneys   Colon cancer Neg Hx    Esophageal cancer Neg Hx    Stomach cancer Neg Hx    Rectal cancer Neg Hx     Social History   Tobacco Use   Smoking status: Every Day    Packs/day: 0.50    Types: Cigarettes   Smokeless tobacco: Never  Vaping Use   Vaping Use: Former   Start date: 06/27/2013   Quit date: 06/14/2017   Devices: Apple Cinnamon-0 mg  Substance Use Topics   Alcohol use: Not Currently    Comment: hx over 30 years   Drug use: Not Currently    Comment: h/o IVD use    Home Medications Prior to Admission medications   Medication Sig Start Date End Date Taking? Authorizing Provider  chlordiazePOXIDE (LIBRIUM) 25 MG capsule 50mg  PO TID x 1D, then 25-50mg  PO BID X 1D, then 25-50mg  PO QD X 1D 05/27/21  Yes Deno Etienne, DO  albuterol (VENTOLIN HFA) 108 (90 Base) MCG/ACT inhaler Inhale 2 puffs into the lungs every 4 (four) hours as needed. 04/23/21   [provider]  amLODipine (NORVASC) 10 MG tablet Take 10 mg by mouth in the morning.    [provider]  Cholecalciferol (VITAMIN D3) 50 MCG (2000 UT) capsule Take 1 capsule (2,000 Units total) by mouth daily. Patient not taking: No sig reported 09/26/20   Wendie Agreste, MD  docusate  sodium (COLACE) 100 MG capsule Take 100 mg by mouth in the morning.    [provider]  ferrous sulfate 325 (65 FE) MG tablet Take 325 mg by mouth in the morning and at bedtime.    [provider]  FLUoxetine (PROZAC) 40 MG capsule Take 40 mg by mouth in the morning. 11/09/18   [provider]  furosemide (LASIX) 40 MG tablet Take 40 mg by mouth daily. 04/30/21   [provider]  gabapentin (NEURONTIN) 300 MG capsule Take 1 capsule (300 mg total) by mouth at bedtime. 05/13/21 06/12/21  Gareth Morgan, MD  hydrALAZINE (APRESOLINE) 50 MG tablet Take 100 mg by mouth 3 (three) times daily. 03/24/21   [provider]  INGREZZA 80 MG CAPS Take 80 mg by mouth at bedtime. 10/15/17   [provider]  isosorbide mononitrate (IMDUR) 60 MG 24 hr tablet Take 60 mg by mouth in the morning.    [provider]  labetalol (NORMODYNE) 300 MG tablet Take 300 mg by mouth 2 (two) times daily.    [provider]  Lidocaine (HM LIDOCAINE PATCH) 4 % PTCH Apply 1 patch topically every 12 (twelve) hours as needed. 05/20/21   Loni Beckwith, PA-C  Melatonin 10 MG TABS Take by mouth.    [provider]  naloxone [facility]) nasal spray 4 mg/0.1 mL SMARTSIG:Both Nares 02/25/21   [provider]  ondansetron (ZOFRAN-ODT) 4 MG disintegrating tablet Take 4 mg by mouth every 8 (eight)  hours as needed for vomiting or nausea. 03/26/21   [provider]  oxyCODONE-acetaminophen (PERCOCET) 10-325 MG tablet Take 1 tablet by mouth in the morning and at bedtime. 03/24/21   [provider]  oxyCODONE-acetaminophen (PERCOCET/ROXICET) 5-325 MG tablet Take 1-2 tablets by mouth every 4 (four) hours as needed for severe pain. 05/04/21   Angelia Mould, MD  pantoprazole (PROTONIX) 40 MG tablet Take 40 mg by mouth daily. 04/05/21   [provider]    Allergies    Abilify [aripiprazole], Remeron [mirtazapine], Trazodone and  nefazodone, Flexeril [cyclobenzaprine], and Amoxicillin  Review of Systems   Review of Systems  Constitutional:  Negative for chills and fever.  HENT:  Negative for congestion and rhinorrhea.   Eyes:  Negative for redness and visual disturbance.  Respiratory:  Negative for shortness of breath and wheezing.   Cardiovascular:  Negative for chest pain and palpitations.  Gastrointestinal:  Negative for nausea and vomiting.  Genitourinary:  Negative for dysuria and urgency.  Musculoskeletal:  Negative for arthralgias and myalgias.  Skin:  Negative for pallor and wound.  Neurological:  Positive for tremors (when she stops drinking). Negative for dizziness and headaches.   Physical Exam Updated Vital Signs BP (!) 162/91   Pulse 60   Temp 98.2 F (36.8 C)   Resp 16   Ht 5\' 2"  (1.575 m)   Wt 93 kg   SpO2 100%   BMI 37.49 kg/m   Physical Exam Vitals and nursing note reviewed.  Constitutional:      General: She is not in acute distress.    Appearance: She is well-developed. She is not diaphoretic.  HENT:     Head: Normocephalic and atraumatic.  Eyes:     Pupils: Pupils are equal, round, and reactive to light.  Cardiovascular:     Rate and Rhythm: Normal rate and regular rhythm.     Heart sounds: No murmur heard.   No friction rub. No gallop.  Pulmonary:     Effort: Pulmonary effort is normal.     Breath sounds: No wheezing or rales.  Abdominal:     General: There is no distension.     Palpations: Abdomen is soft.     Tenderness: There is no abdominal tenderness.  Musculoskeletal:        General: No tenderness.     Cervical back: Normal range of motion and neck supple.  Skin:    General: Skin is warm and dry.  Neurological:     Mental Status: She is alert and oriented to person, place, and time.     Comments: Slightly tremulous but distractible.  Psychiatric:        Behavior: Behavior normal.    ED Results / Procedures / Treatments   Labs (all labs ordered are  listed, but only abnormal results are displayed) Labs Reviewed  COMPREHENSIVE METABOLIC PANEL - Abnormal; Notable for the following components:      Result Value   CO2 17 (*)    BUN 54 (*)    Creatinine, Ser 3.94 (*)    Calcium 8.4 (*)    Albumin 3.3 (*)    AST 12 (*)    GFR, Estimated 12 (*)    All other components within normal limits  CBC - Abnormal; Notable for the following components:   RBC 3.01 (*)    Hemoglobin 8.5 (*)    HCT 28.4 (*)    MCHC 29.9 (*)    RDW 15.7 (*)    All  other components within normal limits  ETHANOL  RAPID URINE DRUG SCREEN, HOSP PERFORMED    EKG None  Radiology No results found.  Procedures Procedures   Medications Ordered in ED Medications  LORazepam (ATIVAN) injection 2 mg (has no administration in time range)  chlordiazePOXIDE (LIBRIUM) capsule 100 mg (has no administration in time range)  oxyCODONE-acetaminophen (PERCOCET/ROXICET) 5-325 MG per tablet 1 tablet (1 tablet Oral Given 05/27/21 1536)    ED Course  I have reviewed the triage vital signs and the nursing notes.  Pertinent labs & imaging results that were available during my care of the patient were reviewed by me and considered in my medical decision making (see chart for details).    MDM Rules/Calculators/A&P                           65 yo F with a chief complaints of wanting withdrawal from alcohol.  I discussed with her that this is typically no longer performed in a hospital setting.  She is very mildly tremulous on my exam.  No hypertension or tachycardia though could be blocked by her antihypertensive medications.  She has a metabolic acidosis which looks chronic likely secondary to her uremia.  Her alcohol level here is negative.  Hemoglobin is 8.5 and appears to be slightly below her baseline.  Patient is a bit apprehensive to go home because she is worried that she will drink again.  I discussed with her that this is something that she may need to do to stop from  going into florid withdrawal.  Recommended that she follow-up with an outpatient rehabilitation center.  Given the current list of resources.  Librium taper.  4:05 PM:  I have discussed the diagnosis/risks/treatment options with the patient and believe the pt to be eligible for discharge home to follow-up with Outpatient/inpatient rehab. We also discussed returning to the ED immediately if new or worsening sx occur. We discussed the sx which are most concerning (e.g., sudden worsening pain, fever, inability to tolerate by mouth, hallucinations, seizures) that necessitate immediate return. Medications administered to the patient during their visit and any new prescriptions provided to the patient are listed below.  Medications given during this visit Medications  LORazepam (ATIVAN) injection 2 mg (has no administration in time range)  chlordiazePOXIDE (LIBRIUM) capsule 100 mg (has no administration in time range)  oxyCODONE-acetaminophen (PERCOCET/ROXICET) 5-325 MG per tablet 1 tablet (1 tablet Oral Given 05/27/21 1536)     The patient appears reasonably screen and/or stabilized for discharge and I doubt any other medical condition or other [facility] requiring further screening, evaluation, or treatment in the ED at this time prior to discharge.     Final Clinical Impression(s) / ED Diagnoses Final diagnoses:  Alcohol dependence with uncomplicated withdrawal ([place])    Rx / DC Orders ED Discharge Orders          Ordered    chlordiazePOXIDE (LIBRIUM) 25 MG capsule        05/27/21 [place], [place], DO 05/27/21 1605

## 2021-05-27 NOTE — ED Provider Notes (Signed)
Emergency Medicine Provider Triage Evaluation Note  Sherry [name] , a 65 y.o. female  was evaluated in triage.  Patient presents to the emergency department wanting to do detox from alcohol.  Patient states that she drinks "as much as I can get every day".  Last alcohol intake approximately 1 hour prior to arrival.  She normally drinks about a pint to 1/5 of liquor every day.  Of note patient has tremors in her hands during triage.  No history of delirium tremens with prior withdrawals.  Patient also states that she uses daily Percocet for chronic back pain, and tells me that she is "using them a little more than they are prescribed".  She also has an extensive smoking history, smokes pack a day.  Alcohol use for over 30 years.  Review of Systems  She has no complaints at this time  Physical Exam  BP (!) 155/73 (BP Location: Left Arm)   Pulse 66   Temp 98.4 F (36.9 C) (Oral)   Resp 18   Ht 5\' 2"  (1.575 m)   Wt 93 kg   SpO2 100%   BMI 37.49 kg/m  Gen:   Awake, no distress   Resp:  Normal effort  MSK:   Moves extremities without difficulty  Other:    Medical Decision Making  Medically screening exam initiated at 12:05 PM.  Appropriate orders placed.  Sherry [name] was informed that the remainder of the evaluation will be completed by another provider, this initial triage assessment does not replace that evaluation, and the importance of remaining in the ED until their evaluation is complete.  Will obtain basic labs with CIWA protocol   Sherry [name] 05/27/21 1220    Sherry Merino, MD 05/31/21 1546

## 2021-05-28 ENCOUNTER — Other Ambulatory Visit (HOSPITAL_COMMUNITY): Payer: Self-pay

## 2021-06-06 DIAGNOSIS — Z9889 Other specified postprocedural states: Secondary | ICD-10-CM | POA: Insufficient documentation

## 2021-06-13 ENCOUNTER — Encounter (HOSPITAL_COMMUNITY): Payer: Medicare HMO

## 2021-06-16 ENCOUNTER — Encounter (HOSPITAL_COMMUNITY): Payer: Self-pay

## 2021-06-23 ENCOUNTER — Emergency Department (HOSPITAL_COMMUNITY): Payer: Medicare HMO

## 2021-06-23 ENCOUNTER — Encounter (HOSPITAL_COMMUNITY): Payer: Self-pay | Admitting: Emergency Medicine

## 2021-06-23 ENCOUNTER — Emergency Department (HOSPITAL_COMMUNITY)
Admission: EM | Admit: 2021-06-23 | Discharge: 2021-06-23 | Disposition: A | Discharge status: Home / Self Care | Payer: Medicare HMO | Attending: Emergency Medicine | Admitting: Emergency Medicine

## 2021-06-23 ENCOUNTER — Other Ambulatory Visit: Payer: Self-pay

## 2021-06-23 DIAGNOSIS — N184 Chronic kidney disease, stage 4 (severe): Secondary | ICD-10-CM | POA: Diagnosis not present

## 2021-06-23 DIAGNOSIS — Z79899 Other long term (current) drug therapy: Secondary | ICD-10-CM | POA: Diagnosis not present

## 2021-06-23 DIAGNOSIS — I5032 Chronic diastolic (congestive) heart failure: Secondary | ICD-10-CM | POA: Diagnosis not present

## 2021-06-23 DIAGNOSIS — R030 Elevated blood-pressure reading, without diagnosis of hypertension: Secondary | ICD-10-CM

## 2021-06-23 DIAGNOSIS — I130 Hypertensive heart and chronic kidney disease with heart failure and stage 1 through stage 4 chronic kidney disease, or unspecified chronic kidney disease: Secondary | ICD-10-CM | POA: Insufficient documentation

## 2021-06-23 DIAGNOSIS — F17210 Nicotine dependence, cigarettes, uncomplicated: Secondary | ICD-10-CM | POA: Diagnosis not present

## 2021-06-23 DIAGNOSIS — Z8616 Personal history of COVID-19: Secondary | ICD-10-CM | POA: Insufficient documentation

## 2021-06-23 DIAGNOSIS — Z853 Personal history of malignant neoplasm of breast: Secondary | ICD-10-CM | POA: Diagnosis not present

## 2021-06-23 DIAGNOSIS — R202 Paresthesia of skin: Secondary | ICD-10-CM | POA: Diagnosis not present

## 2021-06-23 DIAGNOSIS — M5441 Lumbago with sciatica, right side: Secondary | ICD-10-CM | POA: Diagnosis not present

## 2021-06-23 DIAGNOSIS — R1030 Lower abdominal pain, unspecified: Secondary | ICD-10-CM | POA: Insufficient documentation

## 2021-06-23 DIAGNOSIS — M545 Low back pain, unspecified: Secondary | ICD-10-CM | POA: Diagnosis present

## 2021-06-23 LAB — CBC WITH DIFFERENTIAL/PLATELET
Abs Immature Granulocytes: 0.03 10*3/uL (ref 0.00–0.07)
Basophils Absolute: 0 10*3/uL (ref 0.0–0.1)
Basophils Relative: 0 %
Eosinophils Absolute: 0.2 10*3/uL (ref 0.0–0.5)
Eosinophils Relative: 2 %
HCT: 28.8 % — ABNORMAL LOW (ref 36.0–46.0)
Hemoglobin: 8.7 g/dL — ABNORMAL LOW (ref 12.0–15.0)
Immature Granulocytes: 0 %
Lymphocytes Relative: 21 %
Lymphs Abs: 1.5 10*3/uL (ref 0.7–4.0)
MCH: 28.8 pg (ref 26.0–34.0)
MCHC: 30.2 g/dL (ref 30.0–36.0)
MCV: 95.4 fL (ref 80.0–100.0)
Monocytes Absolute: 0.4 10*3/uL (ref 0.1–1.0)
Monocytes Relative: 6 %
Neutro Abs: 4.9 10*3/uL (ref 1.7–7.7)
Neutrophils Relative %: 71 %
Platelets: 282 10*3/uL (ref 150–400)
RBC: 3.02 MIL/uL — ABNORMAL LOW (ref 3.87–5.11)
RDW: 15 % (ref 11.5–15.5)
WBC: 7.1 10*3/uL (ref 4.0–10.5)
nRBC: 0 % (ref 0.0–0.2)

## 2021-06-23 LAB — COMPREHENSIVE METABOLIC PANEL
ALT: 9 U/L (ref 0–44)
AST: 17 U/L (ref 15–41)
Albumin: 3.1 g/dL — ABNORMAL LOW (ref 3.5–5.0)
Alkaline Phosphatase: 89 U/L (ref 38–126)
Anion gap: 11 (ref 5–15)
BUN: 14 mg/dL (ref 8–23)
CO2: 25 mmol/L (ref 22–32)
Calcium: 8.4 mg/dL — ABNORMAL LOW (ref 8.9–10.3)
Chloride: 103 mmol/L (ref 98–111)
Creatinine, Ser: 3.62 mg/dL — ABNORMAL HIGH (ref 0.44–1.00)
GFR, Estimated: 13 mL/min — ABNORMAL LOW (ref >60)
Glucose, Bld: 98 mg/dL (ref 70–99)
Potassium: 3.5 mmol/L (ref 3.5–5.1)
Sodium: 139 mmol/L (ref 135–145)
Total Bilirubin: 0.3 mg/dL (ref 0.3–1.2)
Total Protein: 6.3 g/dL — ABNORMAL LOW (ref 6.5–8.1)

## 2021-06-23 LAB — URINALYSIS, ROUTINE W REFLEX MICROSCOPIC
Bilirubin Urine: NEGATIVE
Glucose, UA: NEGATIVE mg/dL
Hgb urine dipstick: NEGATIVE
Ketones, ur: NEGATIVE mg/dL
Leukocytes,Ua: NEGATIVE
Nitrite: NEGATIVE
Protein, ur: >300 mg/dL — AB
Specific Gravity, Urine: 1.025 (ref 1.005–1.030)
pH: 6.5 (ref 5.0–8.0)

## 2021-06-23 LAB — URINALYSIS, MICROSCOPIC (REFLEX)

## 2021-06-23 MED ORDER — HYDRALAZINE HCL 25 MG PO TABS
100.0000 mg | ORAL_TABLET | Freq: Once | ORAL | Status: AC
  Administered 2021-06-23: 100 mg via ORAL
  Filled 2021-06-23: qty 4

## 2021-06-23 MED ORDER — HYDROCODONE-ACETAMINOPHEN 5-325 MG PO TABS: 1.0000 | ORAL_TABLET | Freq: Four times a day (QID) | ORAL | 0 refills | Status: DC | PRN

## 2021-06-23 MED ORDER — PREDNISONE 20 MG PO TABS: 20.0000 mg | ORAL_TABLET | Freq: Every day | ORAL | 0 refills | Status: AC

## 2021-06-23 MED ORDER — LIDOCAINE 5 % EX PTCH
1.0000 | MEDICATED_PATCH | CUTANEOUS | Status: DC
  Administered 2021-06-23: 1 via TRANSDERMAL
  Filled 2021-06-23: qty 1

## 2021-06-23 MED ORDER — HYDROMORPHONE HCL 1 MG/ML IJ SOLN
1.0000 mg | Freq: Once | INTRAMUSCULAR | Status: AC
  Administered 2021-06-23: 1 mg via INTRAVENOUS
  Filled 2021-06-23: qty 1

## 2021-06-23 MED ORDER — HYDROMORPHONE HCL 1 MG/ML IJ SOLN
0.5000 mg | Freq: Once | INTRAMUSCULAR | Status: AC
  Administered 2021-06-23: 0.5 mg via INTRAVENOUS
  Filled 2021-06-23: qty 1

## 2021-06-23 MED ORDER — BENZONATATE 100 MG PO CAPS: 100.0000 mg | ORAL_CAPSULE | Freq: Three times a day (TID) | ORAL | 0 refills | Status: DC

## 2021-06-23 MED ORDER — LABETALOL HCL 5 MG/ML IV SOLN
20.0000 mg | Freq: Once | INTRAVENOUS | Status: AC
  Administered 2021-06-23: 20 mg via INTRAVENOUS
  Filled 2021-06-23: qty 4

## 2021-06-23 MED ORDER — OXYCODONE-ACETAMINOPHEN 5-325 MG PO TABS
1.0000 | ORAL_TABLET | Freq: Once | ORAL | Status: AC
  Administered 2021-06-23: 1 via ORAL
  Filled 2021-06-23: qty 1

## 2021-06-23 NOTE — ED Notes (Signed)
Discharge instructions and prescriptions reviewed and explained, pt verbalized understanding. Taxi voucher was provided.

## 2021-06-23 NOTE — Discharge Planning (Signed)
RNCM spoke with pt. at bedside regarding make up dialysis appt for tomorrow. Pt. was notified to call [facility] on [address] to schedule her makeup appt. Pt. should contact [facility] or [facility].

## 2021-06-23 NOTE — ED Provider Notes (Signed)
[facility] EMERGENCY DEPARTMENT Provider Note   CSN: [id number] Arrival date & time: 06/23/21  5573     History Chief Complaint  Patient presents with   Back Pain    Sherry [name] is a 65 y.o. female with a past medical history significant for alcohol abuse, chronic low back pain, history of breast cancer, CHF, ESRD on dialysis Monday/Wednesday/Friday, and hypertension presents to the ED due to progressively worsening right low back pain that radiates to right lower extremity that has been present for the past few days.  Pain worse with ambulation and movement.  Admits to some numbness/tingling of right lower extremity.  Endorses chronic right-sided low back pain.  No recent injury.  Patient evaluated in the ED on 05/20/2021 for same complaint where a CT lumbar spine was performed which demonstrated interval posterior decompression and PLIF at L4-L5 without demonstrated complication.  Patient denies recent injury.  Denies saddle paresthesias, bowel/bladder incontinence, lower extremity weakness, fever/chills, IV drug use.  She also endorses some suprapubic abdominal pain.  Denies dysuria. No treatment prior to arrival.   History obtained from patient and past medical records. No interpreter used during encounter.       Past Medical History:  Diagnosis Date   Alcohol abuse 05/09/2019   Alcohol use disorder, severe, dependence ([place]) 08/27/2017   Alcohol withdrawal ([place]) 08/09/2017   Anxiety    ARF (acute renal failure) ([place]) 04/09/2020   Arthropathy of lumbar facet joint 05/24/2020   Bilateral low back pain with bilateral sciatica 01/05/2019   Bipolar and related disorder ([place])    Blood transfusion without reported diagnosis    Body mass index (BMI) 40.0-44.9, adult ([place]) 07/19/2020   Breast cancer ([place])    right lumpectemy and lymph node    Cancer ([place]) 09/06/2017   Cervical spondylosis 05/24/2020   Chest pain 05/31/2020   Chest pain, rule out acute myocardial  infarction 05/29/2020   CHF (congestive heart failure) (HCC)    Chronic back pain    Chronic bilateral low back pain with right-sided sciatica 05/24/2020   Chronic diastolic CHF (congestive heart failure) ([place]) 05/09/2019   Chronic kidney disease    "Stage IV" - states due to hypertension   Chronic kidney disease, stage III (moderate) ([place]) 11/17/2016   Chronic low back pain 09/06/2017   Disc levels:      No abnormality at L2-3 or above.      L3-4:  Mild bulging of the disc.  No stenosis.      L4-5: Bilateral facet arthropathy with gaping, fluid-filled joints.   Joint edema. Anterolisthesis would likely occur at this level with   standing and flexion. Disc degeneration with mild bulging of the   disc. Mild narrowing of the lateral recesses and foramina, which   would likely worsen   Cigarette smoker 12/31/2017   CKD (chronic kidney disease) stage 4, GFR 15-29 ml/min (HCC) 04/18/2020   Clonus 05/24/2020   Cyst of ovary    Depression    Depression with anxiety 08/21/2016   Dizziness 02/24/2018   DOE (dyspnea on exertion) 12/30/2017   12/30/2017   Walked RA x one lap @ 185 stopped due to  Sob/ lightheaded weak with sats 96% at end - Spirometry 12/30/2017  FEV1 1.27 (68%)  Ratio 99 s curvature / abn effort dep portion only     Dyspnea    Dyspnea and respiratory abnormality 02/13/2013   Formatting of this note might be different from the original. STORY: PSG on 05/20/12  showed no OSA but upper airway resistance syndrome, AHI 3.9, severe snoring was noted. ESS=8. UARS. Advise positional therapy, weight loss program, regular exercise.   Essential hypertension 07/06/2017   GERD (gastroesophageal reflux disease)    Grief reaction with prolonged bereavement 09/06/2017   Partner of 18 yrs left 2018-pt admits she still loves her   Hepatitis C    recieved treatment for Hep C   Hepatitis C virus infection cured after antiviral drug therapy 12/17/2012   Telephone Encounter - Dub Mikes, RN - 12/28/2016 9:54 AM EDT  Patient called for HCV RNA test results. EOT 08-26-16 - not detected. 12 week post treatment 12-11-16 - not detected. Informed patient that she was cured. Patient verbalized understanding.          History of breast cancer 02/13/2013   Hx of bipolar disorder 01/31/2016   Hypertension    Hypertensive emergency 10/21/2017   Impaired functional mobility, balance, gait, and endurance 01/09/2021   Left-sided weakness    MDD (major depressive disorder), recurrent severe, without psychosis ([place])    Medication intolerance 09/06/2017   Remeron-appetite stimulant/Dizziness/Dysphoria   Microcytic anemia 10/21/2017   Morbid obesity due to excess calories ([place]) 02/13/2013   BMI 42.9   MRSA infection    of breast incision   Nausea 04/09/2020   Formatting of this note might be different from the original. Added automatically from request for surgery [id number]   Near syncope 05/29/2020   Neck pain 05/24/2020   Neuroleptic-induced tardive dyskinesia 09/06/2017   Abilify   Opioid dependence ([place])    Has been treated in [facility] in past   Opioid use disorder, severe, dependence ([place]) 08/27/2017   Oral aphthous ulcer 06/25/2015   Last Assessment & Plan:  Formatting of this note might be different from the original. - Appear to be resolving as a be expected in the case of aphthous stomatitis. - Continue topical therapy with viscous lidocaine.  Recommended trying high-dose anti-inflammatory such as 600 mg of ibuprofen as needed. - She'll return to ENT clinic if symptoms do not resolve.   Phlebitis after infusion 10/28/2017   Rt antecubital fossa   Pre-operative cardiovascular examination 06/13/2018   Restless legs syndrome 02/13/2013   Formatting of this note might be different from the original. IMPRESSION: Possible. Will follow.   Spondylolisthesis, lumbar region 10/21/2020   Subacute dyskinesia due to drug 02/13/2013   Formatting of this note might be different from the original. STORY: Tardive dyskinesia and mild limb  dyskinesia, LE>UE which likely due to prolonged antipsychotic used, abilify. Couldn't tolerate artane, gabapentin, clonazepam and xenazine.`E1o3L`IMPRESSION: Well tolerated depakote 250mg  bid, mood aspect is better as well. Will increase to 500mg  bid. ADR was discussed.  RTC 4-6 weeks.   Substance abuse ([place])    Substance induced mood disorder ([place]) 08/13/2017   TIA (transient ischemic attack) 2020   P/w BP >230/120 and neurologic symptoms, presumed TIA    Patient Active Problem List   Diagnosis Date Noted   COVID-19 virus infection 04/05/2021   Hypertensive urgency 04/04/2021   Tardive dyskinesia 04/04/2021   Hyperkalemia 04/03/2021   Impaired functional mobility, balance, gait, and endurance 01/09/2021   Spondylolisthesis, lumbar region 10/21/2020   Anxiety    Bipolar and related disorder ([place])    Blood transfusion without reported diagnosis    Breast cancer ([place])    CHF (congestive heart failure) (HCC)    Chronic back pain    Chronic kidney disease    Depression    Dyspnea  Hepatitis C    MRSA infection    Opioid dependence ([place])    Substance abuse ([place])    Body mass index (BMI) 40.0-44.9, adult ([place]) 07/19/2020   Chest pain 05/31/2020   Near syncope 05/29/2020   Chest pain, rule out acute myocardial infarction 05/29/2020   Cervical spondylosis 05/24/2020   Clonus 05/24/2020   Neck pain 05/24/2020   Chronic bilateral low back pain with right-sided sciatica 05/24/2020   Arthropathy of lumbar facet joint 05/24/2020   CKD (chronic kidney disease) stage 4, GFR 15-29 ml/min (HCC) 04/18/2020   ARF (acute renal failure) ([place]) 04/09/2020   Nausea 04/09/2020   Chronic diastolic CHF (congestive heart failure) ([place]) 05/09/2019   GERD (gastroesophageal reflux disease) 05/09/2019   Alcohol abuse 05/09/2019   Hypertension 05/08/2019   Bilateral low back pain with bilateral sciatica 01/05/2019   TIA (transient ischemic attack) 2020   Cyst of ovary    Pre-operative cardiovascular  examination 06/13/2018   Dizziness 02/24/2018   Left-sided weakness 02/24/2018   Cigarette smoker 12/31/2017   DOE (dyspnea on exertion) 12/30/2017   Phlebitis after infusion 10/28/2017   Hypertensive emergency 10/21/2017   Microcytic anemia 10/21/2017   Medication intolerance 09/06/2017   Neuroleptic-induced tardive dyskinesia 09/06/2017   Cancer ([place]) 09/06/2017   Chronic low back pain 09/06/2017   Grief reaction with prolonged bereavement 09/06/2017   Opioid use disorder, severe, dependence ([place]) 08/27/2017   Alcohol use disorder, severe, dependence ([place]) 08/27/2017   Substance induced mood disorder ([place]) 08/13/2017   MDD (major depressive disorder), recurrent severe, without psychosis ([place])    Alcohol withdrawal ([place]) 08/09/2017   Essential hypertension 07/06/2017   Chronic kidney disease, stage III (moderate) ([place]) 11/17/2016   Depression with anxiety 08/21/2016   Hx of bipolar disorder 01/31/2016   Oral aphthous ulcer 06/25/2015   History of breast cancer 02/13/2013   Morbid obesity due to excess calories ([place]) 02/13/2013   Restless legs syndrome 02/13/2013   Subacute dyskinesia due to drug 02/13/2013   Dyspnea and respiratory abnormality 02/13/2013   Hepatitis C virus infection cured after antiviral drug therapy 12/17/2012    Past Surgical History:  Procedure Laterality Date   AV FISTULA PLACEMENT Right 05/01/2021   Procedure: RIGHT ARTERIOVENOUS (AV) FISTULA CREATION;  Surgeon: Broadus John, MD;  Location: [facility] OR;  Service: Vascular;  Laterality: Right;  [place]   BREAST SURGERY Right 2010   "breast cancer survivor" - states partial mastectomy and nodes   COLONOSCOPY     [facility]   ESOPHAGOGASTRODUODENOSCOPY     [facility]   ESOPHAGOGASTRODUODENOSCOPY  04/18/2020   [facility]   LAPAROSCOPIC CHOLECYSTECTOMY  2002   LAPAROSCOPIC INCISIONAL / UMBILICAL / [facility]  04/13/2018   with BARD 15x 20cm  mesh (supraumbilical)   LAPAROSCOPIC LYSIS OF ADHESIONS  07/12/2018   Procedure: LAPAROSCOPIC LYSIS OF ADHESIONS;  Surgeon: Isabel Caprice, MD;  Location: [facility] ORS;  Service: Gynecology;;   MULTIPLE TOOTH EXTRACTIONS     MYOMECTOMY     x 2 prior to hysterectomy   ROBOTIC ASSISTED BILATERAL SALPINGO OOPHERECTOMY Right 07/12/2018   Procedure: XI ROBOTIC ASSISTED RIGHT SALPINGO OOPHORECTOMY;  Surgeon: Isabel Caprice, MD;  Location: [facility] ORS;  Service: Gynecology;  Laterality: Right;   TOTAL ABDOMINAL HYSTERECTOMY     fibroids    UPPER GASTROINTESTINAL ENDOSCOPY     WISDOM TOOTH EXTRACTION       OB History  Gravida  2   Para  0   Term      Preterm      AB  0   Living         SAB      IAB      Ectopic      Multiple      Live Births              Family History  Problem Relation Age of Onset   Heart attack Mother    Breast cancer Mother 54   Dementia Mother    Cancer Father 67       died of bleeding from kidneys   Colon cancer Neg Hx    Esophageal cancer Neg Hx    Stomach cancer Neg Hx    Rectal cancer Neg Hx     Social History   Tobacco Use   Smoking status: Every Day    Packs/day: 0.50    Types: Cigarettes   Smokeless tobacco: Never  Vaping Use   Vaping Use: Former   Start date: 06/27/2013   Quit date: 06/14/2017   Devices: Apple Cinnamon-0 mg  Substance Use Topics   Alcohol use: Not Currently    Comment: hx over 30 years   Drug use: Not Currently    Comment: h/o IVD use    Home Medications Prior to Admission medications   Medication Sig Start Date End Date Taking? Authorizing Provider  albuterol (VENTOLIN HFA) 108 (90 Base) MCG/ACT inhaler Inhale 2 puffs into the lungs every 4 (four) hours as needed for wheezing or shortness of breath. 04/23/21  Yes [provider]  amLODipine (NORVASC) 10 MG tablet Take 10 mg by mouth in the morning.   Yes [provider]  benzonatate (TESSALON) 100 MG capsule Take 1 capsule (100 mg  total) by mouth every 8 (eight) hours. 06/23/21  Yes Philana Younis, Druscilla Brownie, PA-C  Cholecalciferol (VITAMIN D3) 50 MCG (2000 UT) capsule Take 1 capsule (2,000 Units total) by mouth daily. 09/26/20  Yes Wendie Agreste, MD  docusate sodium (COLACE) 100 MG capsule Take 100 mg by mouth daily as needed.   Yes [provider]  ferrous sulfate 325 (65 FE) MG tablet Take 325 mg by mouth in the morning and at bedtime.   Yes [provider]  FLUoxetine (PROZAC) 40 MG capsule Take 40 mg by mouth in the morning. 11/09/18  Yes [provider]  furosemide (LASIX) 40 MG tablet Take 40 mg by mouth daily. 04/30/21  Yes [provider]  gabapentin (NEURONTIN) 600 MG tablet Take 600 mg by mouth 2 (two) times daily. 06/08/21  Yes [provider]  hydrALAZINE (APRESOLINE) 50 MG tablet Take 100 mg by mouth 3 (three) times daily. 03/24/21  Yes [provider]  HYDROcodone-acetaminophen (NORCO/VICODIN) 5-325 MG tablet Take 1 tablet by mouth every 6 (six) hours as needed. 06/23/21  Yes Zoran Yankee C, PA-C  INGREZZA 80 MG CAPS Take 80 mg by mouth at bedtime. 10/15/17  Yes [provider]  isosorbide mononitrate (IMDUR) 60 MG 24 hr tablet Take 60 mg by mouth in the morning.   Yes [provider]  labetalol (NORMODYNE) 300 MG tablet Take 300 mg by mouth 2 (two) times daily.   Yes [provider]  LOKELMA 10 g PACK packet Take 1 packet by mouth daily. 05/01/21  Yes [provider]  Melatonin 10 MG TABS Take 10 mg by mouth daily as needed (sleep).   Yes  [provider]  ondansetron (ZOFRAN-ODT) 4 MG disintegrating tablet Take 4 mg by mouth every 8 (eight) hours as needed for vomiting or nausea. 03/26/21  Yes [provider]  oxyCODONE-acetaminophen (PERCOCET) 10-325 MG tablet Take 1 tablet by mouth 2 (two) times daily as needed for pain. 03/24/21  Yes [provider]  pantoprazole (PROTONIX) 40 MG tablet Take 40 mg  by mouth daily. 04/05/21  Yes [provider]  predniSONE (DELTASONE) 20 MG tablet Take 1 tablet (20 mg total) by mouth daily for 5 days. 06/23/21 06/28/21 Yes Krisinda Giovanni C, PA-C  sodium bicarbonate 650 MG tablet Take 1,300 mg by mouth 2 (two) times daily. 04/30/21  Yes [provider]  chlordiazePOXIDE (LIBRIUM) 25 MG capsule 50mg  PO TID x 1D, then 25-50mg  PO BID X 1D, then 25-50mg  PO QD X 1D Patient not taking: Reported on 06/23/2021 05/27/21   Deno Etienne, DO  gabapentin (NEURONTIN) 300 MG capsule Take 1 capsule (300 mg total) by mouth at bedtime. Patient not taking: Reported on 06/23/2021 05/13/21 06/12/21  Gareth Morgan, MD  Lidocaine (HM LIDOCAINE PATCH) 4 % PTCH Apply 1 patch topically every 12 (twelve) hours as needed. Patient not taking: Reported on 06/23/2021 05/20/21   Loni Beckwith, PA-C  lidocaine (LIDODERM) 5 % Place 1 patch onto the skin daily. Patient not taking: Reported on 06/23/2021 05/12/21   [provider]  oxyCODONE-acetaminophen (PERCOCET/ROXICET) 5-325 MG tablet Take 1-2 tablets by mouth every 4 (four) hours as needed for severe pain. Patient not taking: Reported on 06/23/2021 05/04/21   Angelia Mould, MD    Allergies    Abilify [aripiprazole], Remeron [mirtazapine], Trazodone and nefazodone, Flexeril [cyclobenzaprine], and Amoxicillin  Review of Systems   Review of Systems  Constitutional:  Negative for fever.  Respiratory:  Negative for shortness of breath.   Cardiovascular:  Negative for chest pain.  Gastrointestinal:  Positive for abdominal distention. Negative for diarrhea, nausea and vomiting.  Genitourinary:  Negative for dysuria.  Musculoskeletal:  Positive for back pain.  All other systems reviewed and are negative.  Physical Exam Updated Vital Signs BP (!) 149/64   Pulse 65   Temp 98.8 F (37.1 C) (Oral)   Resp 19   SpO2 97%   Physical Exam Vitals and nursing note reviewed.  Constitutional:       General: She is not in acute distress.    Appearance: She is not ill-appearing.  HENT:     Head: Normocephalic.  Eyes:     Pupils: Pupils are equal, round, and reactive to light.  Cardiovascular:     Rate and Rhythm: Normal rate and regular rhythm.     Pulses: Normal pulses.     Heart sounds: Normal heart sounds. No murmur heard.   No friction rub. No gallop.  Pulmonary:     Effort: Pulmonary effort is normal.     Breath sounds: Normal breath sounds.  Abdominal:     General: Abdomen is flat. There is no distension.     Palpations: Abdomen is soft.     Tenderness: There is abdominal tenderness. There is no guarding or rebound.     Comments: Suprapubic tenderness.  Musculoskeletal:        General: Normal range of motion.     Cervical back: Neck supple.     Comments: No thoracic midline tenderness.  Mild lumbar midline tenderness.  Reproducible right-sided lumbar paraspinal tenderness.  Bilateral lower extremities neurovascularly intact.  Skin:    General: Skin is warm  and dry.  Neurological:     General: No focal deficit present.     Mental Status: She is alert.  Psychiatric:        Mood and Affect: Mood normal.        Behavior: Behavior normal.    ED Results / Procedures / Treatments   Labs (all labs ordered are listed, but only abnormal results are displayed) Labs Reviewed  COMPREHENSIVE METABOLIC PANEL - Abnormal; Notable for the following components:      Result Value   Creatinine, Ser 3.62 (*)    Calcium 8.4 (*)    Total Protein 6.3 (*)    Albumin 3.1 (*)    GFR, Estimated 13 (*)    All other components within normal limits  CBC WITH DIFFERENTIAL/PLATELET - Abnormal; Notable for the following components:   RBC 3.02 (*)    Hemoglobin 8.7 (*)    HCT 28.8 (*)    All other components within normal limits  URINALYSIS, ROUTINE W REFLEX MICROSCOPIC - Abnormal; Notable for the following components:   Protein, ur >300 (*)    All other components within normal limits   URINALYSIS, MICROSCOPIC (REFLEX) - Abnormal; Notable for the following components:   Bacteria, UA RARE (*)    All other components within normal limits    EKG None  Radiology CT Lumbar Spine Wo Contrast  Result Date: 06/23/2021 CLINICAL DATA:  Low back pain, symptoms persist with > 6 wks treatment EXAM: CT LUMBAR SPINE WITHOUT CONTRAST TECHNIQUE: Multidetector CT imaging of the lumbar spine was performed without intravenous contrast administration. Multiplanar CT image reconstructions were also generated. COMPARISON:  05/20/2021 FINDINGS: Segmentation: 5 lumbar type vertebrae. Alignment: Stable. Vertebrae: Stable vertebral body heights postoperative changes are again identified at L4-L5 with rods and pedicle screws and interbody spacer. There is associated streak artifact. No evidence of hardware complication. Vertebral body heights are stable. Paraspinal and other soft tissues: Chronic postoperative changes in the dorsal subcutaneous fat. Aortic atherosclerosis. Disc levels: Stable appearance. As before there is limited evaluation of the spinal canal at the operative level due to artifact. IMPRESSION: No acute abnormality or significant change since recent prior study. Electronically Signed   By: Macy Mis M.D.   On: 06/23/2021 14:38    Procedures Procedures   Medications Ordered in ED Medications  lidocaine (LIDODERM) 5 % 1 patch (1 patch Transdermal Patch Applied 06/23/21 1355)  oxyCODONE-acetaminophen (PERCOCET/ROXICET) 5-325 MG per tablet 1 tablet (1 tablet Oral Given 06/23/21 0618)  HYDROmorphone (DILAUDID) injection 1 mg (1 mg Intravenous Given 06/23/21 1354)  labetalol (NORMODYNE) injection 20 mg (20 mg Intravenous Given 06/23/21 1519)  HYDROmorphone (DILAUDID) injection 0.5 mg (0.5 mg Intravenous Given 06/23/21 1519)  hydrALAZINE (APRESOLINE) tablet 100 mg (100 mg Oral Given 06/23/21 1658)    ED Course  I have reviewed the triage vital signs and the nursing  notes.  Pertinent labs & imaging results that were available during my care of the patient were reviewed by me and considered in my medical decision making (see chart for details).  Clinical Course as of 06/23/21 1757  Mon Jun 23, 2021  1254 Hemoglobin(!): 8.7 [CA]  1254 Creatinine(!): 3.62 [CA]    Clinical Course User Index [CA] Suzy Bouchard, PA-C   MDM Rules/Calculators/A&P                          65 year old female presents to the ED due to acute on chronic right-sided low back pain  that radiates down right lower extremity.  No saddle paresthesias, bowel/bladder incontinence, or lower extremity weakness.  Denies IV drug use.  Upon arrival, afebrile, not tachycardic or hypoxic however, patient's blood pressure significantly elevated at 211/77 likely due to pain.  Patient states she took her blood pressure medication prior to arrival.  Physical exam significant for tenderness throughout midline lumbar spine and right lumbar paraspinal region.  Bilateral lower extremities neurovascularly intact.  Low suspicion for cauda equina or central cord compression.  No infectious symptoms to suggest infectious etiology.  Unfortunately, patient waited over 7 hours prior to my initial evaluation due to long wait times.  BP remains elevated.  Will treat pain and reassess blood pressure.  Patient also missed dialysis today and is requesting a social work consult to arrange transportation for a make-up dialysis session.  Routine labs ordered at triage.  Will obtain CT lumbar spine to also abdomen given suprapubic tenderness.  CBC without leukocytosis.  Anemia with hemoglobin 8.7 which appears to be around patient's baseline.  CMP significant for elevated creatinine 3.62.  Normal BUN.  Normal potassium. CT lumbar spine personally reviewed which is shows no changes from CT scan. UA negative for signs of infection.   Patient able to ambulate in the ED without difficulty.  Low suspicion for cauda equina or  central cord compression.  No infectious symptoms to suggest infectious etiology.  Patient's BP improved after IV hypertensive medications.  Patient discharged with symptomatic treatment for low back pain.  Patient also advised to have BP rechecked in 1 week by PCP.  Patient spoke to social worker to help arrange transportation for dialysis tomorrow. Strict ED precautions discussed with patient. Patient states understanding and agrees to plan. Patient discharged home in no acute distress and stable vitals  Discussed with Dr. Roderic Palau who agrees with assessment and plan.  Final Clinical Impression(s) / ED Diagnoses Final diagnoses:  Acute right-sided low back pain with right-sided sciatica  Elevated blood pressure reading    Rx / DC Orders ED Discharge Orders          Ordered    HYDROcodone-acetaminophen (NORCO/VICODIN) 5-325 MG tablet  Every 6 hours PRN        06/23/21 1753    predniSONE (DELTASONE) 20 MG tablet  Daily        06/23/21 1753    benzonatate (TESSALON) 100 MG capsule  Every 8 hours        06/23/21 1753             Suzy Bouchard, PA-C 06/23/21 1757    Milton Ferguson, MD 06/24/21 [phone]

## 2021-06-23 NOTE — Discharge Instructions (Addendum)
It was a pleasure taking care of you today.  As discussed, your CT scan did not show any changes from your previous scan.  All of your labs are reassuring.  Your urine did not show any signs of infection.  I am sending you home with pain medication, steroids, and cough medication.  Take as needed.  Please follow-up with PCP if symptoms not improve over the next week.  Return to the ER for new or worsening symptoms.  Please report to dialysis tomorrow.  Blood pressure was elevated during your ED stay.  Please follow-up with PCP to recheck blood pressure in 1 week.

## 2021-06-23 NOTE — ED Triage Notes (Addendum)
Patient reports right low back pain for 2 days , denies injury or fall , no hematuria or dysuria . Hemodialysis q Mon/Wed/Fri.

## 2021-06-23 NOTE — ED Provider Notes (Signed)
Emergency Medicine Provider Triage Evaluation Note  Sherry [name] , a 65 y.o. female  was evaluated in triage.  Pt complains of back pain for the past few days. Right lower back, radiates into the RLE, constant, worse with movement, no alleviating factors. Hx of similar pain in the past. Denies recent falls. Also having some suprapubic abdominal pain. ESRDpatient- last dialyzed Friday.   Review of Systems  Positive: Back pain, abdominal pain Negative: Numbness, incontinence, retention, saddle anesthesia, dysuria.   Physical Exam  BP (!) 211/77   Pulse 69   Temp 98.6 F (37 C) (Oral)   Resp 18   SpO2 100%  Gen:   Awake, no distress   Resp:  Normal effort  MSK:   Moves extremities without difficulty  Other:  Right lumbar paraspinal muscle TTP. Suprapubic abdominal TTP, no peritoneal signs. 2+ PT pulses. Sensation grossly intact to [place].   Medical Decision Making  Medically screening exam initiated at 6:16 AM.  Appropriate orders placed.  [name] was informed that the remainder of the evaluation will be completed by another provider, this initial triage assessment does not replace that evaluation, and the importance of remaining in the ED until their evaluation is complete.  CT L spine in November of this year reviewed:  IMPRESSION: 1. Interval posterior decompression and PLIF at L4-5 without demonstrated complication. 2. Chronic spondylosis at L5-S1 with disc bulging and endplate osteophytes contributing to stable mild chronic lateral recess and foraminal narrowing bilaterally. 3. No acute spinal findings demonstrated. 4. Mild bowel distension, suggesting an ileus. 5. Aortic Atherosclerosis (ICD10-I70.0).  Will hold off on imaging from triage at this time.   Back pain.    Leafy Kindle 06/23/21 [id number]    Ezequiel Essex, MD 06/23/21 [phone]

## 2021-06-23 NOTE — ED Notes (Signed)
Pt ambulate okay

## 2021-06-23 NOTE — ED Notes (Signed)
Pt refused to ambulate

## 2021-08-03 ENCOUNTER — Emergency Department (HOSPITAL_COMMUNITY)
Admission: EM | Admit: 2021-08-03 | Discharge: 2021-08-04 | Disposition: A | Discharge status: Home / Self Care | Payer: Medicare Other | Attending: Student | Admitting: Student

## 2021-08-03 ENCOUNTER — Encounter (HOSPITAL_COMMUNITY): Payer: Self-pay

## 2021-08-03 ENCOUNTER — Emergency Department (HOSPITAL_COMMUNITY): Payer: Medicare Other

## 2021-08-03 ENCOUNTER — Other Ambulatory Visit: Payer: Self-pay

## 2021-08-03 DIAGNOSIS — Z853 Personal history of malignant neoplasm of breast: Secondary | ICD-10-CM | POA: Insufficient documentation

## 2021-08-03 DIAGNOSIS — Z992 Dependence on renal dialysis: Secondary | ICD-10-CM | POA: Insufficient documentation

## 2021-08-03 DIAGNOSIS — R079 Chest pain, unspecified: Secondary | ICD-10-CM

## 2021-08-03 DIAGNOSIS — I120 Hypertensive chronic kidney disease with stage 5 chronic kidney disease or end stage renal disease: Secondary | ICD-10-CM | POA: Diagnosis not present

## 2021-08-03 DIAGNOSIS — N186 End stage renal disease: Secondary | ICD-10-CM | POA: Diagnosis not present

## 2021-08-03 DIAGNOSIS — Z79899 Other long term (current) drug therapy: Secondary | ICD-10-CM | POA: Insufficient documentation

## 2021-08-03 DIAGNOSIS — Z20822 Contact with and (suspected) exposure to covid-19: Secondary | ICD-10-CM | POA: Insufficient documentation

## 2021-08-03 DIAGNOSIS — R0789 Other chest pain: Secondary | ICD-10-CM | POA: Diagnosis present

## 2021-08-03 LAB — CBC WITH DIFFERENTIAL/PLATELET
Abs Immature Granulocytes: 0.01 10*3/uL (ref 0.00–0.07)
Basophils Absolute: 0 10*3/uL (ref 0.0–0.1)
Basophils Relative: 1 %
Eosinophils Absolute: 0.2 10*3/uL (ref 0.0–0.5)
Eosinophils Relative: 4 %
HCT: 33.6 % — ABNORMAL LOW (ref 36.0–46.0)
Hemoglobin: 10.5 g/dL — ABNORMAL LOW (ref 12.0–15.0)
Immature Granulocytes: 0 %
Lymphocytes Relative: 29 %
Lymphs Abs: 1.4 10*3/uL (ref 0.7–4.0)
MCH: 29 pg (ref 26.0–34.0)
MCHC: 31.3 g/dL (ref 30.0–36.0)
MCV: 92.8 fL (ref 80.0–100.0)
Monocytes Absolute: 0.4 10*3/uL (ref 0.1–1.0)
Monocytes Relative: 8 %
Neutro Abs: 2.8 10*3/uL (ref 1.7–7.7)
Neutrophils Relative %: 58 %
Platelets: 200 10*3/uL (ref 150–400)
RBC: 3.62 MIL/uL — ABNORMAL LOW (ref 3.87–5.11)
RDW: 14.8 % (ref 11.5–15.5)
WBC: 4.8 10*3/uL (ref 4.0–10.5)
nRBC: 0 % (ref 0.0–0.2)

## 2021-08-03 LAB — COMPREHENSIVE METABOLIC PANEL
ALT: 10 U/L (ref 0–44)
AST: 21 U/L (ref 15–41)
Albumin: 3.4 g/dL — ABNORMAL LOW (ref 3.5–5.0)
Alkaline Phosphatase: 90 U/L (ref 38–126)
Anion gap: 8 (ref 5–15)
BUN: 18 mg/dL (ref 8–23)
CO2: 25 mmol/L (ref 22–32)
Calcium: 8.9 mg/dL (ref 8.9–10.3)
Chloride: 103 mmol/L (ref 98–111)
Creatinine, Ser: 3.96 mg/dL — ABNORMAL HIGH (ref 0.44–1.00)
GFR, Estimated: 12 mL/min — ABNORMAL LOW (ref >60)
Glucose, Bld: 97 mg/dL (ref 70–99)
Potassium: 3.7 mmol/L (ref 3.5–5.1)
Sodium: 136 mmol/L (ref 135–145)
Total Bilirubin: 0.5 mg/dL (ref 0.3–1.2)
Total Protein: 6.7 g/dL (ref 6.5–8.1)

## 2021-08-03 LAB — TROPONIN I (HIGH SENSITIVITY)
Troponin I (High Sensitivity): 11 ng/L (ref <18)
Troponin I (High Sensitivity): 10 ng/L (ref <18)
Troponin I (High Sensitivity): 11 ng/L (ref <18)

## 2021-08-03 LAB — LIPASE, BLOOD: Lipase: 38 U/L (ref 11–51)

## 2021-08-03 LAB — RESP PANEL BY RT-PCR (FLU A&B, COVID) ARPGX2
Influenza A by PCR: NEGATIVE
Influenza B by PCR: NEGATIVE
SARS Coronavirus 2 by RT PCR: NEGATIVE

## 2021-08-03 MED ORDER — FENTANYL CITRATE PF 50 MCG/ML IJ SOSY
50.0000 ug | PREFILLED_SYRINGE | Freq: Once | INTRAMUSCULAR | Status: AC
  Administered 2021-08-03: 50 ug via INTRAVENOUS
  Filled 2021-08-03: qty 1

## 2021-08-03 MED ORDER — MORPHINE SULFATE (PF) 4 MG/ML IV SOLN: 4.0000 mg | Freq: Once | INTRAVENOUS | Status: DC

## 2021-08-03 MED ORDER — ONDANSETRON HCL 4 MG/2ML IJ SOLN
4.0000 mg | Freq: Once | INTRAMUSCULAR | Status: AC
  Administered 2021-08-03: 4 mg via INTRAVENOUS
  Filled 2021-08-03: qty 2

## 2021-08-03 MED ORDER — ONDANSETRON HCL 4 MG/2ML IJ SOLN: 4.0000 mg | Freq: Once | INTRAMUSCULAR | Status: DC

## 2021-08-03 MED ORDER — HYDROMORPHONE HCL 1 MG/ML IJ SOLN
0.5000 mg | Freq: Once | INTRAMUSCULAR | Status: AC
  Administered 2021-08-03: 0.5 mg via INTRAVENOUS
  Filled 2021-08-03: qty 1

## 2021-08-03 MED ORDER — KETOROLAC TROMETHAMINE 15 MG/ML IJ SOLN
15.0000 mg | Freq: Once | INTRAMUSCULAR | Status: AC
Start: 2021-08-03 — End: 2021-08-03
  Administered 2021-08-03: 15 mg via INTRAVENOUS
  Filled 2021-08-03: qty 1

## 2021-08-03 MED ORDER — OXYCODONE HCL 5 MG PO TABS
5.0000 mg | ORAL_TABLET | Freq: Once | ORAL | Status: AC
  Administered 2021-08-03: 5 mg via ORAL
  Filled 2021-08-03: qty 1

## 2021-08-03 MED ORDER — LIDOCAINE 5 % EX PTCH
1.0000 | MEDICATED_PATCH | Freq: Once | CUTANEOUS | Status: DC
  Administered 2021-08-03: 1 via TRANSDERMAL
  Filled 2021-08-03: qty 1

## 2021-08-03 MED ORDER — IOHEXOL 350 MG/ML SOLN
50.0000 mL | Freq: Once | INTRAVENOUS | Status: AC | PRN
  Administered 2021-08-03: 50 mL via INTRAVENOUS

## 2021-08-03 NOTE — Consult Note (Incomplete)
Cardiology Consultation:   Patient ID: Shaterra Sanzone MRN: [id number]; DOB: 06-Oct-1955  Admit date: 08/03/2021 Date of Consult: 08/03/2021  Primary Care Provider: Wynelle Fanny, DO [facility] Cardiologist: Jenean Lindau, MD  [place] Electrophysiologist:  None   Patient Profile:   Srah Ake is a 66 y.o. female with HFrEF, ESRD/iHD, HTN and obesity who is being seen today for the evaluation of chest pain at the request of Benedetto Goad.   History of Present Illness:   Ms. Barrasso is followed by Dr. Geraldo Pitter and her last telemedicine appointment was 07/30/20 about how she will not any chest pain, orthopnea, or PND with a report of a very sedentary lifestyle.  She was planning on undergoing L4-L5 surgery at that appointment and preop risk assessment included a Lexiscan since she was so sedentary.  Her Lexiscan was normal within LVEF of 55 to 65% and no ST segment deviation.  She did report shortness of breath with the administration of regadenoson but otherwise denied any chest pain or pressure.   Today she was brought in by EMS for left-sided chest pain that started at 0300 and woke her from sleep.  She denied any nausea, vomiting, diaphoresis or SOB.  She was given aspirin 324 mg and nitroglycerin x1.  She undergoes iHD (MWF) without any recent missed sessions.  VS on ED evaluation: P 65, BP 161/78, RR 20, O2 100%/RA, RR 20  Review of her labs with anemia at baseline (Hb 10.5, bl 8-10  Past Medical History:  Diagnosis Date   Alcohol abuse 05/09/2019   Alcohol use disorder, severe, dependence ([place]) 08/27/2017   Alcohol withdrawal ([place]) 08/09/2017   Anxiety    ARF (acute renal failure) ([place]) 04/09/2020   Arthropathy of lumbar facet joint 05/24/2020   Bilateral low back pain with bilateral sciatica 01/05/2019   Bipolar and related disorder ([place])    Blood transfusion without reported diagnosis    Body mass index (BMI) 40.0-44.9, adult ([place]) 07/19/2020   Breast cancer ([place])    right  lumpectemy and lymph node    Cancer ([place]) 09/06/2017   Cervical spondylosis 05/24/2020   Chest pain 05/31/2020   Chest pain, rule out acute myocardial infarction 05/29/2020   CHF (congestive heart failure) (HCC)    Chronic back pain    Chronic bilateral low back pain with right-sided sciatica 05/24/2020   Chronic diastolic CHF (congestive heart failure) ([place]) 05/09/2019   Chronic kidney disease    "Stage IV" - states due to hypertension   Chronic kidney disease, stage III (moderate) ([place]) 11/17/2016   Chronic low back pain 09/06/2017   Disc levels:      No abnormality at L2-3 or above.      L3-4:  Mild bulging of the disc.  No stenosis.      L4-5: Bilateral facet arthropathy with gaping, fluid-filled joints.   Joint edema. Anterolisthesis would likely occur at this level with   standing and flexion. Disc degeneration with mild bulging of the   disc. Mild narrowing of the lateral recesses and foramina, which   would likely worsen   Cigarette smoker 12/31/2017   CKD (chronic kidney disease) stage 4, GFR 15-29 ml/min (HCC) 04/18/2020   Clonus 05/24/2020   Cyst of ovary    Depression    Depression with anxiety 08/21/2016   Dizziness 02/24/2018   DOE (dyspnea on exertion) 12/30/2017   12/30/2017   Walked RA x one lap @ 185 stopped due to  Sob/ lightheaded weak with sats 96%  at end - Spirometry 12/30/2017  FEV1 1.27 (68%)  Ratio 99 s curvature / abn effort dep portion only     Dyspnea    Dyspnea and respiratory abnormality 02/13/2013   Formatting of this note might be different from the original. STORY: PSG on 05/20/12 showed no OSA but upper airway resistance syndrome, AHI 3.9, severe snoring was noted. ESS=8. UARS. Advise positional therapy, weight loss program, regular exercise.   Essential hypertension 07/06/2017   GERD (gastroesophageal reflux disease)    Grief reaction with prolonged bereavement 09/06/2017   Partner of 18 yrs left 2018-pt admits she still loves her   Hepatitis C    recieved treatment  for Hep C   Hepatitis C virus infection cured after antiviral drug therapy 12/17/2012   Telephone Encounter - Dub Mikes, RN - 12/28/2016 9:54 AM EDT Patient called for HCV RNA test results. EOT 08-26-16 - not detected. 12 week post treatment 12-11-16 - not detected. Informed patient that she was cured. Patient verbalized understanding.          History of breast cancer 02/13/2013   Hx of bipolar disorder 01/31/2016   Hypertension    Hypertensive emergency 10/21/2017   Impaired functional mobility, balance, gait, and endurance 01/09/2021   Left-sided weakness    MDD (major depressive disorder), recurrent severe, without psychosis ([place])    Medication intolerance 09/06/2017   Remeron-appetite stimulant/Dizziness/Dysphoria   Microcytic anemia 10/21/2017   Morbid obesity due to excess calories ([place]) 02/13/2013   BMI 42.9   MRSA infection    of breast incision   Nausea 04/09/2020   Formatting of this note might be different from the original. Added automatically from request for surgery [id number]   Near syncope 05/29/2020   Neck pain 05/24/2020   Neuroleptic-induced tardive dyskinesia 09/06/2017   Abilify   Opioid dependence ([place])    Has been treated in [facility] in past   Opioid use disorder, severe, dependence ([place]) 08/27/2017   Oral aphthous ulcer 06/25/2015   Last Assessment & Plan:  Formatting of this note might be different from the original. - Appear to be resolving as a be expected in the case of aphthous stomatitis. - Continue topical therapy with viscous lidocaine.  Recommended trying high-dose anti-inflammatory such as 600 mg of ibuprofen as needed. - She'll return to ENT clinic if symptoms do not resolve.   Phlebitis after infusion 10/28/2017   Rt antecubital fossa   Pre-operative cardiovascular examination 06/13/2018   Restless legs syndrome 02/13/2013   Formatting of this note might be different from the original. IMPRESSION: Possible. Will follow.   Spondylolisthesis, lumbar region  10/21/2020   Subacute dyskinesia due to drug 02/13/2013   Formatting of this note might be different from the original. STORY: Tardive dyskinesia and mild limb dyskinesia, LE>UE which likely due to prolonged antipsychotic used, abilify. Couldn't tolerate artane, gabapentin, clonazepam and xenazine.`E1o3L`IMPRESSION: Well tolerated depakote 250mg  bid, mood aspect is better as well. Will increase to 500mg  bid. ADR was discussed.  RTC 4-6 weeks.   Substance abuse ([place])    Substance induced mood disorder ([place]) 08/13/2017   TIA (transient ischemic attack) 2020   P/w BP >230/120 and neurologic symptoms, presumed TIA    Past Surgical History:  Procedure Laterality Date   AV FISTULA PLACEMENT Right 05/01/2021   Procedure: RIGHT ARTERIOVENOUS (AV) FISTULA CREATION;  Surgeon: Broadus John, MD;  Location: [facility];  Service: Vascular;  Laterality: Right;  [place]  BREAST SURGERY Right 2010   "breast cancer survivor" - states partial mastectomy and nodes   COLONOSCOPY     [facility]   ESOPHAGOGASTRODUODENOSCOPY     [facility]   ESOPHAGOGASTRODUODENOSCOPY  04/18/2020   [facility]   LAPAROSCOPIC CHOLECYSTECTOMY  2002   LAPAROSCOPIC INCISIONAL / UMBILICAL / VENTRAL HERNIA REPAIR  04/13/2018   with BARD 15x 53MI mesh (supraumbilical)   LAPAROSCOPIC LYSIS OF ADHESIONS  07/12/2018   Procedure: LAPAROSCOPIC LYSIS OF ADHESIONS;  Surgeon: Isabel Caprice, MD;  Location: [facility];  Service: Gynecology;;   MULTIPLE TOOTH EXTRACTIONS     MYOMECTOMY     x 2 prior to hysterectomy   ROBOTIC ASSISTED BILATERAL SALPINGO OOPHERECTOMY Right 07/12/2018   Procedure: XI ROBOTIC ASSISTED RIGHT SALPINGO OOPHORECTOMY;  Surgeon: Isabel Caprice, MD;  Location: [facility];  Service: Gynecology;  Laterality: Right;   TOTAL ABDOMINAL HYSTERECTOMY     fibroids    UPPER GASTROINTESTINAL ENDOSCOPY     WISDOM TOOTH EXTRACTION       {Home Medications  (Optional):21181}  Inpatient Medications: Scheduled Meds:  Continuous Infusions:  PRN Meds:   Allergies:    Allergies  Allergen Reactions   Abilify [Aripiprazole] Other (See Comments)    Tardive dyskinesia Oral   Remeron [Mirtazapine] Other (See Comments)    Wgt stimulation /gain, Dizziness, Patient says "can tolerate"   Trazodone And Nefazodone Other (See Comments)    Nightmares/sleep diturbance   Flexeril [Cyclobenzaprine] Other (See Comments)    Pt states Flexeril makes her feel depressed    Amoxicillin Diarrhea and Other (See Comments)    NOTE the patient has had PCN WITHOUT reaction Has patient had a PCN reaction causing immediate rash, facial/tongue/throat swelling, SOB or lightheadedness with hypotension: No Has patient had a PCN reaction causing severe rash involving mucus membranes or skin necrosis: No Has patient had a PCN reaction that required hospitalization: No Has patient had a PCN reaction occurring within the last 10 years: No If all of the above answers are "NO", then may proceed with Cephalosporin use.    Social History:   Social History   Socioeconomic History   Marital status: Single    Spouse name: Not on file   Number of children: 0   Years of education: 12   Highest education level: High school graduate  Occupational History   Occupation: Disabled  Tobacco Use   Smoking status: Every Day    Packs/day: 0.50    Types: Cigarettes   Smokeless tobacco: Never  Vaping Use   Vaping Use: Former   Start date: 06/27/2013   Quit date: 06/14/2017   Devices: Apple Cinnamon-0 mg  Substance and Sexual Activity   Alcohol use: Not Currently    Comment: hx over 30 years   Drug use: Not Currently    Comment: h/o IVD use   Sexual activity: Not Currently  Other Topics Concern   Not on file  Social History Narrative   Lives at home alone.   Right-handed.   Caffeine use: 4 cups coffee/soda   Social Determinants of Health   Financial Resource Strain:  Not on file  Food Insecurity: Not on file  Transportation Needs: Not on file  Physical Activity: Not on file  Stress: Not on file  Social Connections: Not on file  Intimate Partner Violence: Not on file    Family History:    Family History  Problem Relation Age of Onset   Heart attack Mother    Breast  cancer Mother 32   Dementia Mother    Cancer Father 2       died of bleeding from kidneys   Colon cancer Neg Hx    Esophageal cancer Neg Hx    Stomach cancer Neg Hx    Rectal cancer Neg Hx     ROS:  Review of Systems: [y] = yes, [ ]  = no      General: Weight gain [ ] ; Weight loss [ ] ; Anorexia [ ] ; Fatigue [ ] ; Fever [ ] ; Chills [ ] ; Weakness [ ]    Cardiac: Chest pain/pressure [ ] ; Resting SOB [ ] ; Exertional SOB [ ] ; Orthopnea [ ] ; Pedal Edema [ ] ; Palpitations [ ] ; Syncope [ ] ; Presyncope [ ] ; Paroxysmal nocturnal dyspnea [ ]    Pulmonary: Cough [ ] ; Wheezing [ ] ; Hemoptysis [ ] ; Sputum [ ] ; Snoring [ ]    GI: Vomiting [ ] ; Dysphagia [ ] ; Melena [ ] ; Hematochezia [ ] ; Heartburn [ ] ; Abdominal pain [ ] ; Constipation [ ] ; Diarrhea [ ] ; BRBPR [ ]    GU: Hematuria [ ] ; Dysuria [ ] ; Nocturia [ ]  Vascular: Pain in legs with walking [ ] ; Pain in feet with lying flat [ ] ; Non-healing sores [ ] ; Stroke [ ] ; TIA [ ] ; Slurred speech [ ] ;   Neuro: Headaches [ ] ; Vertigo [ ] ; Seizures [ ] ; Paresthesias [ ] ;Blurred vision [ ] ; Diplopia [ ] ; Vision changes [ ]    Ortho/Skin: Arthritis [ ] ; Joint pain [ ] ; Muscle pain [ ] ; Joint swelling [ ] ; Back Pain [ ] ; Rash [ ]    Psych: Depression [ ] ; Anxiety [ ]    Heme: Bleeding problems [ ] ; Clotting disorders [ ] ; Anemia [ ]    Endocrine: Diabetes [ ] ; Thyroid dysfunction [ ]    Physical Exam/Data:   Vitals:   08/03/21 1729 08/03/21 1730 08/03/21 1915 08/03/21 1930  BP:  (!) 197/82 (!) 180/78 (!) 165/64  Pulse: 72  70 74  Resp: 15 17 20 16   Temp:      TempSrc:      SpO2: 95%  100% 100%  Weight:      Height:       No intake or output data in the  24 hours ending 08/03/21 2119 Last 3 Weights 08/03/2021 05/27/2021 05/20/2021  Weight (lbs) 205 lb 0.4 oz 205 lb 206 lb  Weight (kg) 93 kg 92.987 kg 93.441 kg  Some encounter information is confidential and restricted. Go to Review Flowsheets activity to see all data.     Body mass index is 37.5 kg/m.  General:  Well nourished, well developed, in no acute distress*** HEENT: normal Lymph: no adenopathy Neck: no JVD Endocrine:  No thryomegaly Vascular: No carotid bruits; FA pulses 2+ bilaterally without bruits  Cardiac:  normal S1, S2; RRR; no murmur *** Lungs:  clear to auscultation bilaterally, no wheezing, rhonchi or rales  Abd: soft, nontender, no hepatomegaly  Ext: no edema Musculoskeletal:  No deformities, BUE and BLE strength normal and equal Skin: warm and dry  Neuro:  CNs 2-12 intact, no focal abnormalities noted Psych:  Normal affect   EKG:  The EKG was personally reviewed and demonstrates:  *** Telemetry:  Telemetry was personally reviewed and demonstrates:  ***  Relevant CV Studies: ***  Laboratory Data:  High Sensitivity Troponin:   Recent Labs  Lab 08/03/21 1441 08/03/21 1711  TROPONINIHS 11 10     Chemistry Recent Labs  Lab 08/03/21 1441  NA 136  K  3.7  CL 103  CO2 25  GLUCOSE 97  BUN 18  CREATININE 3.96*  CALCIUM 8.9  GFRNONAA 12*  ANIONGAP 8    Recent Labs  Lab 08/03/21 1441  PROT 6.7  ALBUMIN 3.4*  AST 21  ALT 10  ALKPHOS 90  BILITOT 0.5   Hematology Recent Labs  Lab 08/03/21 1441  WBC 4.8  RBC 3.62*  HGB 10.5*  HCT 33.6*  MCV 92.8  MCH 29.0  MCHC 31.3  RDW 14.8  PLT 200   BNPNo results for input(s): BNP, PROBNP in the last 168 hours.  DDimer No results for input(s): DDIMER in the last 168 hours.  Radiology/Studies:  DG Chest 2 View  Result Date: 08/03/2021 CLINICAL DATA:  Chest pain. EXAM: CHEST - 2 VIEW COMPARISON:  07/23/2021 FINDINGS: Low volume film. The cardio pericardial silhouette is enlarged. Subsegmental  atelectasis or linear scarring noted left lower lung. No focal consolidation or pleural effusion. Right lung clear. Right IJ central line tip overlies the right atrium. IMPRESSION: Low volume film with cardiomegaly. No acute cardiopulmonary findings. Electronically Signed   By: Misty Stanley M.D.   On: 08/03/2021 15:22   CT Angio Chest PE W/Cm &/Or Wo Cm  Result Date: 08/03/2021 CLINICAL DATA:  Left-sided chest pain EXAM: CT ANGIOGRAPHY CHEST WITH CONTRAST TECHNIQUE: Multidetector CT imaging of the chest was performed using the standard protocol during bolus administration of intravenous contrast. Multiplanar CT image reconstructions and MIPs were obtained to evaluate the vascular anatomy. RADIATION DOSE REDUCTION: This exam was performed according to the departmental dose-optimization program which includes automated exposure control, adjustment of the mA and/or kV according to patient size and/or use of iterative reconstruction technique. CONTRAST:  39mL OMNIPAQUE IOHEXOL 350 MG/ML SOLN COMPARISON:  07/02/2021 FINDINGS: Cardiovascular: Satisfactory opacification of the pulmonary arteries to the segmental level. No evidence of pulmonary embolism. Cardiomegaly. Scattered coronary artery calcifications. No pericardial effusion. Enlargement of the main pulmonary artery, measuring up to 3.9 cm. Large-bore right neck multi lumen vascular catheter. Mediastinum/Nodes: No enlarged mediastinal, hilar, or axillary lymph nodes. Thyroid gland, trachea, and esophagus demonstrate no significant findings. Lungs/Pleura: Lungs are clear. No pleural effusion or pneumothorax. Upper Abdomen: No acute abnormality. Stable, benign, fat containing left adrenal adenoma (series 7, image 240). Musculoskeletal: No chest wall abnormality. No acute osseous findings. Review of the MIP images confirms the above findings. IMPRESSION: 1. Negative examination for pulmonary embolism. 2. Cardiomegaly and coronary artery disease. 3. Enlargement of  the main pulmonary artery, as can be seen in pulmonary hypertension. Electronically Signed   By: Delanna Ahmadi M.D.   On: 08/03/2021 18:37   { If the patient is being seen for chest pain, [place], NSTEMI or STEMI Press F2 to calculate a risk score         :[id number]}   {Chest Pain/ACS Risk Score        :[id number]}   Assessment and Plan:   ***  {Are we signing off today?:[id number]}  For questions or updates, please contact [place] Please consult [email or web address] for contact info under    Signed, Dion Body, MD  08/03/2021 9:19 PM

## 2021-08-03 NOTE — ED Provider Notes (Signed)
[facility] EMERGENCY DEPARTMENT Provider Note   CSN: [id number] Arrival date & time: 08/03/21  1418     History  Chief Complaint  Patient presents with   Chest Pain    Sherry [name] is a 66 y.o. female.  Sherry [name] is a 66 y.o. female with a past medical history significant for alcohol/opioid abuse, breast cancer, CHF, ESRD on dialysis Monday/Wednesday/Friday, and hypertension who presents to the ED via EMS due to chest pain. Patient states she was awoken by the pain at 3 AM this morning and states it was located at the left lower chest and then radiated to her right chest at the location of her dialysis port.  She reports she does not typically have pain surrounding her dialysis port.  Describes the pain as throbbing & pleuritic.  Sometimes worse with movement.  Pain is not specifically exertional.  Endorses diaphoresis, dyspnea, N/V, denies abdominal pain. Denies fever, palpitations, hemoptysis, constipation, diarrhea, dysuria. Was given ASA & Nitroglycerin by EMS and states chest pain was unrelieved with treatment. Patient has not missed any dialysis appointments.     The history is provided by the patient, the EMS personnel and medical records.      Home Medications Prior to Admission medications   Medication Sig Start Date End Date Taking? Authorizing Provider  albuterol (VENTOLIN HFA) 108 (90 Base) MCG/ACT inhaler Inhale 2 puffs into the lungs every 4 (four) hours as needed for wheezing or shortness of breath. 04/23/21   [provider]  amLODipine (NORVASC) 10 MG tablet Take 10 mg by mouth in the morning.    [provider]  benzonatate (TESSALON) 100 MG capsule Take 1 capsule (100 mg total) by mouth every 8 (eight) hours. 06/23/21   Suzy Bouchard, PA-C  chlordiazePOXIDE (LIBRIUM) 25 MG capsule 50mg  PO TID x 1D, then 25-50mg  PO BID X 1D, then 25-50mg  PO QD X 1D Patient not taking: Reported on 06/23/2021 05/27/21   Deno Etienne, DO   Cholecalciferol (VITAMIN D3) 50 MCG (2000 UT) capsule Take 1 capsule (2,000 Units total) by mouth daily. 09/26/20   Wendie Agreste, MD  docusate sodium (COLACE) 100 MG capsule Take 100 mg by mouth daily as needed.    [provider]  ferrous sulfate 325 (65 FE) MG tablet Take 325 mg by mouth in the morning and at bedtime.    [provider]  FLUoxetine (PROZAC) 40 MG capsule Take 40 mg by mouth in the morning. 11/09/18   [provider]  furosemide (LASIX) 40 MG tablet Take 40 mg by mouth daily. 04/30/21   [provider]  gabapentin (NEURONTIN) 300 MG capsule Take 1 capsule (300 mg total) by mouth at bedtime. Patient not taking: Reported on 06/23/2021 05/13/21 06/12/21  Gareth Morgan, MD  gabapentin (NEURONTIN) 600 MG tablet Take 600 mg by mouth 2 (two) times daily. 06/08/21   [provider]  hydrALAZINE (APRESOLINE) 50 MG tablet Take 100 mg by mouth 3 (three) times daily. 03/24/21   [provider]  HYDROcodone-acetaminophen (NORCO/VICODIN) 5-325 MG tablet Take 1 tablet by mouth every 6 (six) hours as needed. 06/23/21   Suzy Bouchard, PA-C  INGREZZA 80 MG CAPS Take 80 mg by mouth at bedtime. 10/15/17   [provider]  isosorbide mononitrate (IMDUR) 60 MG 24 hr tablet Take 60 mg by mouth in the morning.    [provider]  labetalol (NORMODYNE) 300 MG tablet Take 300 mg by mouth 2 (two) times daily.  [provider]  Lidocaine (HM LIDOCAINE PATCH) 4 % PTCH Apply 1 patch topically every 12 (twelve) hours as needed. Patient not taking: Reported on 06/23/2021 05/20/21   Loni Beckwith, PA-C  lidocaine (LIDODERM) 5 % Place 1 patch onto the skin daily. Patient not taking: Reported on 06/23/2021 05/12/21   [provider]  LOKELMA 10 g PACK packet Take 1 packet by mouth daily. 05/01/21   [provider]  Melatonin 10 MG TABS Take 10 mg by mouth daily as needed (sleep).    [provider]  ondansetron (ZOFRAN-ODT) 4 MG disintegrating tablet Take 4 mg by mouth every 8 (eight) hours as needed for vomiting or nausea. 03/26/21   [provider]  oxyCODONE-acetaminophen (PERCOCET) 10-325 MG tablet Take 1 tablet by mouth 2 (two) times daily as needed for pain. 03/24/21   [provider]  oxyCODONE-acetaminophen (PERCOCET/ROXICET) 5-325 MG tablet Take 1-2 tablets by mouth every 4 (four) hours as needed for severe pain. Patient not taking: Reported on 06/23/2021 05/04/21   Angelia Mould, MD  pantoprazole (PROTONIX) 40 MG tablet Take 40 mg by mouth daily. 04/05/21   [provider]  sodium bicarbonate 650 MG tablet Take 1,300 mg by mouth 2 (two) times daily. 04/30/21   [provider]      Allergies    Abilify [aripiprazole], Remeron [mirtazapine], Trazodone and nefazodone, Flexeril [cyclobenzaprine], and Amoxicillin    Review of Systems   Review of Systems  Constitutional:  Positive for diaphoresis. Negative for chills and fever.  HENT: Negative.    Respiratory:  Positive for shortness of breath.   Cardiovascular:  Positive for chest pain. Negative for palpitations and leg swelling.  Gastrointestinal:  Positive for nausea and vomiting. Negative for abdominal distention, abdominal pain, constipation and diarrhea.  Genitourinary:  Negative for dysuria.  Musculoskeletal:  Negative for arthralgias, myalgias and neck pain.  Skin:  Negative for color change and rash.  Neurological:  Negative for dizziness, syncope and light-headedness.  All other systems reviewed and are negative.  Physical Exam Updated Vital Signs BP (!) 161/78    Pulse 65    Temp 98 F (36.7 C) (Oral)    Resp 20    Ht 5\' 2"  (1.575 m)    Wt 93 kg    SpO2 100%    BMI 37.50 kg/m  Physical Exam Vitals and nursing note reviewed.  Constitutional:      General: She is not in acute distress.    Appearance: Normal appearance. She is well-developed. She is not  diaphoretic.     Comments: Alert, chronically ill-appearing but in no acute distress.  HENT:     Head: Normocephalic and atraumatic.  Eyes:     General:        Right eye: No discharge.        Left eye: No discharge.     Pupils: Pupils are equal, round, and reactive to light.  Cardiovascular:     Rate and Rhythm: Normal rate and regular rhythm.     Pulses: Normal pulses.          Radial pulses are 2+ on the right side and 2+ on the left side.       Dorsalis pedis pulses are 2+ on the right side and 2+ on the left side.     Heart sounds: Normal heart sounds. No murmur heard. Pulmonary:     Effort: Pulmonary effort is normal. No respiratory distress.     Breath sounds:  Normal breath sounds. No wheezing or rales.     Comments: Respirations equal and unlabored, patient able to speak in full sentences, lungs clear to auscultation bilaterally  Chest:     Chest wall: Tenderness present.     Comments: Tenderness to the left anterior chest wall without palpable deformity or overlying skin changes.  Dialysis port present in the right upper chest. Abdominal:     General: Bowel sounds are normal. There is no distension.     Palpations: Abdomen is soft. There is no mass.     Tenderness: There is no abdominal tenderness. There is no guarding.     Comments: Abdomen soft, nondistended, nontender to palpation in all quadrants without guarding or peritoneal signs  Musculoskeletal:        General: No deformity.     Cervical back: Neck supple.     Right lower leg: No edema.     Left lower leg: No edema.  Skin:    General: Skin is warm and dry.     Capillary Refill: Capillary refill takes less than 2 seconds.  Neurological:     Mental Status: She is alert and oriented to person, place, and time.     Coordination: Coordination normal.     Comments: Speech is clear, able to follow commands Moves extremities without ataxia, coordination intact  Psychiatric:        Mood and Affect: Mood normal.         Behavior: Behavior normal.    ED Results / Procedures / Treatments   Labs (all labs ordered are listed, but only abnormal results are displayed) Labs Reviewed  CBC WITH DIFFERENTIAL/PLATELET - Abnormal; Notable for the following components:      Result Value   RBC 3.62 (*)    Hemoglobin 10.5 (*)    HCT 33.6 (*)    All other components within normal limits  COMPREHENSIVE METABOLIC PANEL - Abnormal; Notable for the following components:   Creatinine, Ser 3.96 (*)    Albumin 3.4 (*)    GFR, Estimated 12 (*)    All other components within normal limits  LIPASE, BLOOD  TROPONIN I (HIGH SENSITIVITY)  TROPONIN I (HIGH SENSITIVITY)    EKG None  Radiology DG Chest 2 View  Result Date: 08/03/2021 CLINICAL DATA:  Chest pain. EXAM: CHEST - 2 VIEW COMPARISON:  07/23/2021 FINDINGS: Low volume film. The cardio pericardial silhouette is enlarged. Subsegmental atelectasis or linear scarring noted left lower lung. No focal consolidation or pleural effusion. Right lung clear. Right IJ central line tip overlies the right atrium. IMPRESSION: Low volume film with cardiomegaly. No acute cardiopulmonary findings. Electronically Signed   By: Misty Stanley M.D.   On: 08/03/2021 15:22   CT Angio Chest PE W/Cm &/Or Wo Cm  Result Date: 08/03/2021 CLINICAL DATA:  Left-sided chest pain EXAM: CT ANGIOGRAPHY CHEST WITH CONTRAST TECHNIQUE: Multidetector CT imaging of the chest was performed using the standard protocol during bolus administration of intravenous contrast. Multiplanar CT image reconstructions and MIPs were obtained to evaluate the vascular anatomy. RADIATION DOSE REDUCTION: This exam was performed according to the departmental dose-optimization program which includes automated exposure control, adjustment of the mA and/or kV according to patient size and/or use of iterative reconstruction technique. CONTRAST:  71mL OMNIPAQUE IOHEXOL 350 MG/ML SOLN COMPARISON:  07/02/2021 FINDINGS: Cardiovascular:  Satisfactory opacification of the pulmonary arteries to the segmental level. No evidence of pulmonary embolism. Cardiomegaly. Scattered coronary artery calcifications. No pericardial effusion. Enlargement of the main pulmonary artery,  measuring up to 3.9 cm. Large-bore right neck multi lumen vascular catheter. Mediastinum/Nodes: No enlarged mediastinal, hilar, or axillary lymph nodes. Thyroid gland, trachea, and esophagus demonstrate no significant findings. Lungs/Pleura: Lungs are clear. No pleural effusion or pneumothorax. Upper Abdomen: No acute abnormality. Stable, benign, fat containing left adrenal adenoma (series 7, image 240). Musculoskeletal: No chest wall abnormality. No acute osseous findings. Review of the MIP images confirms the above findings. IMPRESSION: 1. Negative examination for pulmonary embolism. 2. Cardiomegaly and coronary artery disease. 3. Enlargement of the main pulmonary artery, as can be seen in pulmonary hypertension. Electronically Signed   By: Delanna Ahmadi M.D.   On: 08/03/2021 18:37    Procedures Procedures    Medications Ordered in ED Medications  lidocaine (LIDODERM) 5 % 1 patch (1 patch Transdermal Patch Applied 08/03/21 2315)  fentaNYL (SUBLIMAZE) injection 50 mcg (50 mcg Intravenous Given 08/03/21 1734)  ondansetron (ZOFRAN) injection 4 mg (4 mg Intravenous Given 08/03/21 1733)  iohexol (OMNIPAQUE) 350 MG/ML injection 50 mL (50 mLs Intravenous Contrast Given 08/03/21 1827)  HYDROmorphone (DILAUDID) injection 0.5 mg (0.5 mg Intravenous Given 08/03/21 1937)  ketorolac (TORADOL) 15 MG/ML injection 15 mg (15 mg Intravenous Given 08/03/21 2312)  oxyCODONE (Oxy IR/ROXICODONE) immediate release tablet 5 mg (5 mg Oral Given 08/03/21 2313)    ED Course/ Medical Decision Making/ A&P                           Sherry [name] is a 66 y.o. female presents to the ED for concern of chest pain, this involves an extensive number of treatment options, and is a complaint that carries with  it a high risk of complications and morbidity.  The differential diagnosis includes ACS, dissection, PE, CHF, arrhythmia, pneumothorax, pneumonia, musculoskeletal pain, GERD, abdominal process   Additional history obtained:  Additional history obtained from EMS personnel and medical records reviewed External records from outside source obtained and reviewed including recent cardiology notes   Lab Tests:  I Ordered, reviewed, and interpreted labs.  The pertinent results include: No leukocytosis, stable hemoglobin, no significant electrolyte derangements, initial troponin is negative, negative COVID and flu, normal lipase   Imaging Studies ordered:  I ordered imaging studies including chest x-ray and CT PE study I independently visualized and interpreted imaging which showed no active cardiopulmonary disease on chest x-ray.  PE study with no evidence of pulmonary embolism, there is cardiomegaly and coronary artery disease present, enlargement of the main pulmonary artery suggestive of pulmonary hypertension I agree with the radiologist interpretation   Cardiac Monitoring:  The patient was maintained on a cardiac monitor.  I personally viewed and interpreted the cardiac monitored which showed an underlying rhythm of: Normal sinus rhythm   Medicines ordered and prescription drug management:  I ordered fentanyl and Zofran for pain. Reevaluation of the patient after these medicines showed that the patient stayed the same Additional pain medications ordered, patient without improvement with aspirin or nitro with EMS I have reviewed the patients home medicines and have made adjustments as needed   ED Course:  Patient with ongoing chest pain, has concerning associated symptoms of diaphoresis, nausea, vomiting and shortness of breath.  But also has some reproducible component of chest pain concerning for potential musculoskeletal pain. Initial troponin negative, delta troponin without any  increase which was reassuring.  EKG without changes. CT angio without evidence of PE Despite reassuring work-up patient continues to complain of pain.  She does have a  heart pathway score of 5, will discuss with medicine for potential admission.   Consultations Obtained:  I requested consultation with the hospitalist, Dr. Kristopher Oppenheim,  and discussed lab and imaging findings as well as pertinent plan -given that patient had a stress test is reported was reassuring, and 2 negative troponins they feel the patient is not require admission.  Recommend cardiology consult. Case discussed with Dr. Clarene Duke with cardiology, discussed labs, imaging improving plan, while patient does have risk given medical history and dialysis, given that patient had recent negative stress test and negative enzymes, there may not be much more to add per cardiology, would likely not cath at this point.  Patient is already managed on Imdur   Reevaluation:  After the interventions noted above, I reevaluated the patient and found that they have :stayed the same  Despite Reassuring work-up with the L3 negative troponins, no EKG changes and negative PE study.  Patient continues to complain of pain.  It is reproducible on exam and may be more so related to costochondritis.  Discussed with Dr. Novella Olive who saw and evaluated the patient as well.  We will treat with Toradol, Lidoderm patch and oxycodone and see if this further improves pain.   At shift change care signed out to PA [facility] found out who will reevaluate patient's pain after these medications, anticipate discharge home with continued symptomatic treatment but if patient continues to have persistent chest pain despite this will need to reconsult medicine team and cardiology.         Final Clinical Impression(s) / ED Diagnoses Final diagnoses:  Left-sided chest pain    Rx / DC Orders ED Discharge Orders     None         Janet Berlin 08/04/21 0122    Teressa Lower, MD 08/04/21 [phone]

## 2021-08-03 NOTE — ED Notes (Signed)
Warm blanket placed across pt

## 2021-08-03 NOTE — ED Provider Triage Note (Signed)
Emergency Medicine Provider Triage Evaluation Note  Jaydon Soroka , a 66 y.o. female  was evaluated in triage.  Pt complains of chest pain.  This started at 3am.  Chest pain is left sided undder the breast.  She reports N/V/SHOB.  She hasn't missed any dialysis treatments.  She got nitrox1 and 324mg  asa with ems with out change.   Review of Systems  Positive: See above Negative:   Physical Exam  Ht 5\' 2"  (1.575 m)    Wt 93 kg    BMI 37.50 kg/m  Gen:   Awake, no distress   Resp:  Normal effort  MSK:   Moves extremities without difficulty  Other:  Normal speech.   Medical Decision Making  Medically screening exam initiated at 2:28 PM.  Appropriate orders placed.  [name] was informed that the remainder of the evaluation will be completed by another provider, this initial triage assessment does not replace that evaluation, and the importance of remaining in the ED until their evaluation is complete.  Note: Portions of this report may have been transcribed using voice recognition software. Every effort was made to ensure accuracy; however, inadvertent computerized transcription errors may be present    Lorin Glass, PA-C 08/03/21 1433

## 2021-08-03 NOTE — ED Provider Notes (Signed)
Accepted handoff at shift change from [facility]. Please see prior provider note for more detail.   Briefly: Patient is 67 y.o.   "Sherry [name] is a 66 y.o. female with a past medical history significant for alcohol/opioid abuse, breast cancer, CHF, ESRD on dialysis Monday/Wednesday/Friday, and hypertension who presents to the ED via EMS due to chest pain. Patient states she was awoken by the pain at 3 AM this morning and states it was located at the left lower chest and then radiated to her right chest at the location of her dialysis port.  She reports she does not typically have pain surrounding her dialysis port.  Describes the pain as throbbing & pleuritic.  Sometimes worse with movement.  Pain is not specifically exertional.  Endorses diaphoresis, dyspnea, N/V, denies abdominal pain. Denies fever, palpitations, hemoptysis, constipation, diarrhea, dysuria. Was given ASA & Nitroglycerin by EMS and states chest pain was unrelieved with treatment. Patient has not missed any dialysis appointments."   Plan: Follow-up on pain control regimen and disposition according to patient pain    Physical Exam  BP (!) 182/67 (BP Location: Left Arm)    Pulse 84    Temp 98 F (36.7 C) (Oral)    Resp 19    Ht 5\' 2"  (1.575 m)    Wt 93 kg    SpO2 98%    BMI 37.50 kg/m   Physical Exam Vitals and nursing note reviewed.  Constitutional:      General: She is not in acute distress. HENT:     Head: Normocephalic and atraumatic.     Nose: Nose normal.  Eyes:     General: No scleral icterus. Cardiovascular:     Rate and Rhythm: Normal rate and regular rhythm.     Pulses: Normal pulses.     Heart sounds: Normal heart sounds.  Pulmonary:     Effort: Pulmonary effort is normal. No respiratory distress.     Breath sounds: No wheezing.  Chest:     Chest wall: Tenderness present.  Abdominal:     Palpations: Abdomen is soft.     Tenderness: There is no abdominal tenderness.  Musculoskeletal:     Cervical back:  Normal range of motion.     Right lower leg: No edema.     Left lower leg: No edema.  Skin:    General: Skin is warm and dry.     Capillary Refill: Capillary refill takes less than 2 seconds.  Neurological:     Mental Status: She is alert. Mental status is at baseline.  Psychiatric:        Mood and Affect: Mood normal.        Behavior: Behavior normal.    Procedures  Procedures  ED Course / MDM    Medical Decision Making Amount and/or Complexity of Data Reviewed Radiology: ordered.  Risk Prescription drug management.   Patient is a 66 year old female presented to ER today with complaints of left-sided/sternal chest pain.  She has been thoroughly worked up by prior provider.  Had a negative PE contrast chest CT scan had a reassuring EKG without any acute ischemia no significant changes from prior EKGs.  Labs without remarkable changes troponin x3 within normal limits and no significant delta. Lipase within normal meds.  COVID influenza negative.  Patient eventually treated with Lidoderm, Toradol and seems to have significant improvement on my reassessment.  She was assessed by Dr. Matilde Sprang as well as prior PA and I agree with her assessment that  costochondritis seems very likely.  Cardiology was consulted and they think outpatient follow-up is reasonable.  Given reassuring work-up and improvement in pain will discharge home with analgesia in the form of Naprosyn and lidocaine patches.  Return precautions given.  She understands plan and is agreeable to plan.  Will return for new or concerning symptoms such as worsening chest pain dyspnea or nausea vomiting diaphoresis or other.     Pati Gallo [place], [place] 08/04/21 [place], Ankit, MD 08/05/21 [phone]

## 2021-08-03 NOTE — ED Notes (Signed)
Patient transported to CT 

## 2021-08-03 NOTE — ED Triage Notes (Signed)
Pt arrived via [facility] from home for c/o left sided chest pain that started at 0300, which woke her up out of sleep. Pt also has right sided chest pain in dialysis port area. Pt c/o N/V/SOB also. EMS gave ASA 324mg  and nitrox1. Pt VSS. Pt eupneic in triage. PT dialysis pt M,W,F. Pt states has not missed any dialysis.

## 2021-08-04 ENCOUNTER — Other Ambulatory Visit (HOSPITAL_COMMUNITY): Payer: Self-pay

## 2021-08-04 ENCOUNTER — Encounter (HOSPITAL_COMMUNITY): Payer: Self-pay

## 2021-08-04 MED ORDER — LIDOCAINE 5 % EX PTCH
1.0000 | MEDICATED_PATCH | CUTANEOUS | 0 refills | Status: DC
  Filled 2021-08-04: qty 30, 30d supply, fill #0

## 2021-08-04 MED ORDER — NAPROXEN 375 MG PO TABS
375.0000 mg | ORAL_TABLET | Freq: Two times a day (BID) | ORAL | 0 refills | Status: DC
  Filled 2021-08-04: qty 20, 10d supply, fill #0

## 2021-08-04 MED ORDER — OXYCODONE HCL 5 MG PO TABS
5.0000 mg | ORAL_TABLET | Freq: Four times a day (QID) | ORAL | 0 refills | Status: DC | PRN
  Filled 2021-08-04 (×2): qty 14, 4d supply, fill #0

## 2021-08-04 NOTE — Progress Notes (Signed)
Case discussed with ED APP. Troponin x 3 sets are negative. Stress test in jan 2022 negative. ED APP states she will discharge patient home. Cardiology has been consulted. Admission deferred.

## 2021-08-04 NOTE — Discharge Instructions (Signed)
I am glad that your symptoms have improved.  Please use lidocaine patches as discussed.  I have also written a prescription for Naprosyn.  I have also prescribed you oxycodone for home use.  I recommend calling first thing this morning to make an appointment with your cardiologist for follow-up.  You may always return emergency room for any new or concerning symptoms.

## 2021-08-14 ENCOUNTER — Encounter (HOSPITAL_COMMUNITY): Payer: Self-pay

## 2021-08-14 ENCOUNTER — Other Ambulatory Visit (HOSPITAL_BASED_OUTPATIENT_CLINIC_OR_DEPARTMENT_OTHER): Payer: Self-pay

## 2021-08-14 ENCOUNTER — Other Ambulatory Visit (HOSPITAL_COMMUNITY): Payer: Self-pay

## 2021-08-14 ENCOUNTER — Encounter (HOSPITAL_BASED_OUTPATIENT_CLINIC_OR_DEPARTMENT_OTHER): Payer: Self-pay | Admitting: Emergency Medicine

## 2021-08-14 ENCOUNTER — Other Ambulatory Visit: Payer: Self-pay

## 2021-08-14 ENCOUNTER — Emergency Department (HOSPITAL_BASED_OUTPATIENT_CLINIC_OR_DEPARTMENT_OTHER)
Admission: EM | Admit: 2021-08-14 | Discharge: 2021-08-14 | Disposition: A | Discharge status: Home / Self Care | Payer: Medicare Other | Attending: Emergency Medicine | Admitting: Emergency Medicine

## 2021-08-14 DIAGNOSIS — N186 End stage renal disease: Secondary | ICD-10-CM | POA: Insufficient documentation

## 2021-08-14 DIAGNOSIS — M545 Low back pain, unspecified: Secondary | ICD-10-CM | POA: Diagnosis present

## 2021-08-14 DIAGNOSIS — Z992 Dependence on renal dialysis: Secondary | ICD-10-CM | POA: Insufficient documentation

## 2021-08-14 DIAGNOSIS — M5431 Sciatica, right side: Secondary | ICD-10-CM | POA: Diagnosis not present

## 2021-08-14 LAB — URINALYSIS, MICROSCOPIC (REFLEX)

## 2021-08-14 LAB — URINALYSIS, ROUTINE W REFLEX MICROSCOPIC
Bilirubin Urine: NEGATIVE
Glucose, UA: NEGATIVE mg/dL
Hgb urine dipstick: NEGATIVE
Ketones, ur: NEGATIVE mg/dL
Leukocytes,Ua: NEGATIVE
Nitrite: NEGATIVE
Protein, ur: >300 mg/dL — AB
Specific Gravity, Urine: 1.02 (ref 1.005–1.030)
pH: 8.5 — ABNORMAL HIGH (ref 5.0–8.0)

## 2021-08-14 MED ORDER — HYDROCODONE-ACETAMINOPHEN 5-325 MG PO TABS
1.0000 | ORAL_TABLET | Freq: Four times a day (QID) | ORAL | 0 refills | Status: DC | PRN
  Filled 2021-08-14: qty 6, 2d supply, fill #0

## 2021-08-14 MED ORDER — OXYCODONE-ACETAMINOPHEN 5-325 MG PO TABS
1.0000 | ORAL_TABLET | Freq: Once | ORAL | Status: AC
  Administered 2021-08-14: 1 via ORAL

## 2021-08-14 NOTE — ED Triage Notes (Signed)
Pt c/o lower RT side back pain since last night; took gabapentin, Tylenol arthritis and lidoderm patch w/o relief; no injury

## 2021-08-14 NOTE — Progress Notes (Signed)
HISTORY AND PHYSICAL     CC:  dialysis access Requesting Provider:  Wynelle Fanny, DO  HPI: This is a 66 y.o. female here for evaluation of her hemodialysis access.  Pt has hx of right 1st stage BVT on 05/01/2021 for CKD IV by Dr. Virl Cagey.  She is sent for evaluation of her fistula for when it can be used.  Pt had appt in December that she did not make and comes in today for evaluation.  She states that she has started dialysis since she was last seen by our office.  She is dialyzing M/W/F at the [facility] location.  She does have a right IJ TDC that was placed she thinks by [facility].  She states she does have some aching in her right hand but she is able to use her hand.   Dialysis access history: As above   The pt is right hand dominant.    Pt is on dialysis.   Days of dialysis if applicable:  M/W/F    HD center if applicable:  [place] location.   The pt is not on a statin for cholesterol management.  The pt is not on a daily aspirin.  Other AC:  none The pt is on CCB, BB, hydralazine, diuretic for hypertension.  The pt is not diabetic.   Tobacco hx:  current  Past Medical History:  Diagnosis Date   Alcohol abuse 05/09/2019   Alcohol use disorder, severe, dependence ([place]) 08/27/2017   Alcohol withdrawal ([place]) 08/09/2017   Anxiety    ARF (acute renal failure) ([place]) 04/09/2020   Arthropathy of lumbar facet joint 05/24/2020   Bilateral low back pain with bilateral sciatica 01/05/2019   Bipolar and related disorder ([place])    Blood transfusion without reported diagnosis    Body mass index (BMI) 40.0-44.9, adult ([place]) 07/19/2020   Breast cancer ([place])    right lumpectemy and lymph node    Cancer ([place]) 09/06/2017   Cervical spondylosis 05/24/2020   Chest pain 05/31/2020   Chest pain, rule out acute myocardial infarction 05/29/2020   CHF (congestive heart failure) (HCC)    Chronic back pain    Chronic bilateral low back pain with right-sided sciatica 05/24/2020   Chronic diastolic  CHF (congestive heart failure) ([place]) 05/09/2019   Chronic kidney disease    "Stage IV" - states due to hypertension   Chronic kidney disease, stage III (moderate) ([place]) 11/17/2016   Chronic low back pain 09/06/2017   Disc levels:      No abnormality at L2-3 or above.      L3-4:  Mild bulging of the disc.  No stenosis.      L4-5: Bilateral facet arthropathy with gaping, fluid-filled joints.   Joint edema. Anterolisthesis would likely occur at this level with   standing and flexion. Disc degeneration with mild bulging of the   disc. Mild narrowing of the lateral recesses and foramina, which   would likely worsen   Cigarette smoker 12/31/2017   CKD (chronic kidney disease) stage 4, GFR 15-29 ml/min (HCC) 04/18/2020   Clonus 05/24/2020   Cyst of ovary    Depression    Depression with anxiety 08/21/2016   Dizziness 02/24/2018   DOE (dyspnea on exertion) 12/30/2017   12/30/2017   Walked RA x one lap @ 185 stopped due to  Sob/ lightheaded weak with sats 96% at end - Spirometry 12/30/2017  FEV1 1.27 (68%)  Ratio 99 s curvature / abn effort dep portion only  Dyspnea    Dyspnea and respiratory abnormality 02/13/2013   Formatting of this note might be different from the original. STORY: PSG on 05/20/12 showed no OSA but upper airway resistance syndrome, AHI 3.9, severe snoring was noted. ESS=8. UARS. Advise positional therapy, weight loss program, regular exercise.   Essential hypertension 07/06/2017   GERD (gastroesophageal reflux disease)    Grief reaction with prolonged bereavement 09/06/2017   Partner of 18 yrs left 2018-pt admits she still loves her   Hepatitis C    recieved treatment for Hep C   Hepatitis C virus infection cured after antiviral drug therapy 12/17/2012   Telephone Encounter - Dub Mikes, RN - 12/28/2016 9:54 AM EDT Patient called for HCV RNA test results. EOT 08-26-16 - not detected. 12 week post treatment 12-11-16 - not detected. Informed patient that she was cured. Patient verbalized  understanding.          History of breast cancer 02/13/2013   Hx of bipolar disorder 01/31/2016   Hypertension    Hypertensive emergency 10/21/2017   Impaired functional mobility, balance, gait, and endurance 01/09/2021   Left-sided weakness    MDD (major depressive disorder), recurrent severe, without psychosis ([place])    Medication intolerance 09/06/2017   Remeron-appetite stimulant/Dizziness/Dysphoria   Microcytic anemia 10/21/2017   Morbid obesity due to excess calories ([place]) 02/13/2013   BMI 42.9   MRSA infection    of breast incision   Nausea 04/09/2020   Formatting of this note might be different from the original. Added automatically from request for surgery [id number]   Near syncope 05/29/2020   Neck pain 05/24/2020   Neuroleptic-induced tardive dyskinesia 09/06/2017   Abilify   Opioid dependence ([place])    Has been treated in [facility] in past   Opioid use disorder, severe, dependence ([place]) 08/27/2017   Oral aphthous ulcer 06/25/2015   Last Assessment & Plan:  Formatting of this note might be different from the original. - Appear to be resolving as a be expected in the case of aphthous stomatitis. - Continue topical therapy with viscous lidocaine.  Recommended trying high-dose anti-inflammatory such as 600 mg of ibuprofen as needed. - She'll return to ENT clinic if symptoms do not resolve.   Phlebitis after infusion 10/28/2017   Rt antecubital fossa   Pre-operative cardiovascular examination 06/13/2018   Restless legs syndrome 02/13/2013   Formatting of this note might be different from the original. IMPRESSION: Possible. Will follow.   Spondylolisthesis, lumbar region 10/21/2020   Subacute dyskinesia due to drug 02/13/2013   Formatting of this note might be different from the original. STORY: Tardive dyskinesia and mild limb dyskinesia, LE>UE which likely due to prolonged antipsychotic used, abilify. Couldn't tolerate artane, gabapentin, clonazepam and xenazine.`E1o3L`IMPRESSION: Well tolerated  depakote 250mg  bid, mood aspect is better as well. Will increase to 500mg  bid. ADR was discussed.  RTC 4-6 weeks.   Substance abuse ([place])    Substance induced mood disorder ([place]) 08/13/2017   TIA (transient ischemic attack) 2020   P/w BP >230/120 and neurologic symptoms, presumed TIA    Past Surgical History:  Procedure Laterality Date   AV FISTULA PLACEMENT Right 05/01/2021   Procedure: RIGHT ARTERIOVENOUS (AV) FISTULA CREATION;  Surgeon: Broadus John, MD;  Location: [facility];  Service: Vascular;  Laterality: Right;  [place]   BREAST SURGERY Right 2010   "breast cancer survivor" - states partial mastectomy and nodes   COLONOSCOPY     [facility]   ESOPHAGOGASTRODUODENOSCOPY     [facility]   ESOPHAGOGASTRODUODENOSCOPY  04/18/2020   [facility]   LAPAROSCOPIC CHOLECYSTECTOMY  2002   LAPAROSCOPIC INCISIONAL / UMBILICAL / VENTRAL HERNIA REPAIR  04/13/2018   with BARD 15x 37SE mesh (supraumbilical)   LAPAROSCOPIC LYSIS OF ADHESIONS  07/12/2018   Procedure: LAPAROSCOPIC LYSIS OF ADHESIONS;  Surgeon: Isabel Caprice, MD;  Location: [facility];  Service: Gynecology;;   MULTIPLE TOOTH EXTRACTIONS     MYOMECTOMY     x 2 prior to hysterectomy   ROBOTIC ASSISTED BILATERAL SALPINGO OOPHERECTOMY Right 07/12/2018   Procedure: XI ROBOTIC ASSISTED RIGHT SALPINGO OOPHORECTOMY;  Surgeon: Isabel Caprice, MD;  Location: [facility];  Service: Gynecology;  Laterality: Right;   TOTAL ABDOMINAL HYSTERECTOMY     fibroids    UPPER GASTROINTESTINAL ENDOSCOPY     WISDOM TOOTH EXTRACTION      Allergies  Allergen Reactions   Abilify [Aripiprazole] Other (See Comments)    Tardive dyskinesia Oral   Remeron [Mirtazapine] Other (See Comments)    Wgt stimulation /gain, Dizziness, Patient says "can tolerate"   Trazodone And Nefazodone Other (See Comments)    Nightmares/sleep diturbance   Flexeril [Cyclobenzaprine] Other (See Comments)    Pt states Flexeril makes  her feel depressed    Amoxicillin Diarrhea and Other (See Comments)    NOTE the patient has had PCN WITHOUT reaction Has patient had a PCN reaction causing immediate rash, facial/tongue/throat swelling, SOB or lightheadedness with hypotension: No Has patient had a PCN reaction causing severe rash involving mucus membranes or skin necrosis: No Has patient had a PCN reaction that required hospitalization: No Has patient had a PCN reaction occurring within the last 10 years: No If all of the above answers are "NO", then may proceed with Cephalosporin use.     Current Outpatient Medications  Medication Sig Dispense Refill   albuterol (VENTOLIN HFA) 108 (90 Base) MCG/ACT inhaler Inhale 2 puffs into the lungs every 4 (four) hours as needed for wheezing or shortness of breath.     amLODipine (NORVASC) 10 MG tablet Take 10 mg by mouth in the morning.     benzonatate (TESSALON) 100 MG capsule Take 1 capsule (100 mg total) by mouth every 8 (eight) hours. 21 capsule 0   chlordiazePOXIDE (LIBRIUM) 25 MG capsule 50mg  PO TID x 1D, then 25-50mg  PO BID X 1D, then 25-50mg  PO QD X 1D (Patient not taking: Reported on 06/23/2021) 10 capsule 0   Cholecalciferol (VITAMIN D3) 50 MCG (2000 UT) capsule Take 1 capsule (2,000 Units total) by mouth daily. 30 capsule 0   docusate sodium (COLACE) 100 MG capsule Take 100 mg by mouth daily as needed.     ferrous sulfate 325 (65 FE) MG tablet Take 325 mg by mouth in the morning and at bedtime.     FLUoxetine (PROZAC) 40 MG capsule Take 40 mg by mouth in the morning.     furosemide (LASIX) 40 MG tablet Take 40 mg by mouth daily.     gabapentin (NEURONTIN) 300 MG capsule Take 1 capsule (300 mg total) by mouth at bedtime. (Patient not taking: Reported on 06/23/2021) 30 capsule 0   gabapentin (NEURONTIN) 600 MG tablet Take 600 mg by mouth 2 (two) times daily.     hydrALAZINE (APRESOLINE) 50 MG tablet Take 100 mg by mouth 3 (three) times daily.     HYDROcodone-acetaminophen  (NORCO/VICODIN) 5-325 MG tablet Take 1 tablet by mouth every 6 (six) hours as needed.  6 tablet 0   INGREZZA 80 MG CAPS Take 80 mg by mouth at bedtime.     isosorbide mononitrate (IMDUR) 60 MG 24 hr tablet Take 60 mg by mouth in the morning.     labetalol (NORMODYNE) 300 MG tablet Take 300 mg by mouth 2 (two) times daily.     lidocaine (LIDODERM) 5 % Place 1 patch onto the skin daily. Remove & Discard patch within 12 hours or as directed by MD 30 patch 0   LOKELMA 10 g PACK packet Take 1 packet by mouth daily.     Melatonin 10 MG TABS Take 10 mg by mouth daily as needed (sleep).     naproxen (NAPROSYN) 375 MG tablet Take 1 tablet (375 mg total) by mouth 2 (two) times daily. 20 tablet 0   ondansetron (ZOFRAN-ODT) 4 MG disintegrating tablet Take 4 mg by mouth every 8 (eight) hours as needed for vomiting or nausea.     pantoprazole (PROTONIX) 40 MG tablet Take 40 mg by mouth daily.     sodium bicarbonate 650 MG tablet Take 1,300 mg by mouth 2 (two) times daily.     No current facility-administered medications for this visit.    Family History  Problem Relation Age of Onset   Heart attack Mother    Breast cancer Mother 91   Dementia Mother    Cancer Father 85       died of bleeding from kidneys   Colon cancer Neg Hx    Esophageal cancer Neg Hx    Stomach cancer Neg Hx    Rectal cancer Neg Hx     Social History   Socioeconomic History   Marital status: Single    Spouse name: Not on file   Number of children: 0   Years of education: 12   Highest education level: High school graduate  Occupational History   Occupation: Disabled  Tobacco Use   Smoking status: Every Day    Packs/day: 0.50    Types: Cigarettes   Smokeless tobacco: Never  Vaping Use   Vaping Use: Former   Start date: 06/27/2013   Quit date: 06/14/2017   Devices: Apple Cinnamon-0 mg  Substance and Sexual Activity   Alcohol use: Not Currently    Comment: hx over 30 years   Drug use: Not Currently    Comment: h/o  IVD use   Sexual activity: Not Currently  Other Topics Concern   Not on file  Social History Narrative   Lives at home alone.   Right-handed.   Caffeine use: 4 cups coffee/soda   Social Determinants of Health   Financial Resource Strain: Not on file  Food Insecurity: Not on file  Transportation Needs: Not on file  Physical Activity: Not on file  Stress: Not on file  Social Connections: Not on file  Intimate Partner Violence: Not on file     ROS: [x]  Positive   [ ]  Negative   [ ]  All sytems reviewed and are negative  Cardiac: [x]  hx CHF   Vascular: []  pain in legs while walking  Pulmonary: []  asthma []  wheezing  Hematologic/oncology [x]  breast cancer  Musculoskeletal [x]  chronic back issues  GU: [x]  CKD/renal failure  [x]  HD---[x]  M/W/F []  T/T/S  Psychiatric: [x]  hx of depression  Integumentary: []  rashes []  ulcers  Constitutional: []  fever []  chills   PHYSICAL EXAMINATION:  Today's Vitals   08/22/21 1442  BP: (!) 158/77  Pulse: 62  Resp: 20  Temp: 98.6 F (37  C)  TempSrc: Temporal  SpO2: 98%  Weight: 201 lb 6.4 oz (91.4 kg)  Height: 5\' 2"  (1.575 m)  PainSc: 8    Body mass index is 36.84 kg/m.    General:  WDWN female in NAD Gait: Normal HENT: WNL Pulmonary: normal non-labored breathing  Cardiac: regular Skin: without rashes Vascular Exam/Pulses:  Palpable right radial pulse Extremities:  excellent thrill in fistula; right hand is warm and well perfused. Musculoskeletal: no muscle wasting or atrophy  Neurologic: A&O X 3; Speech is fluent/normal  Non-Invasive Vascular Imaging:   Upper Extremity Vein Mapping on 08/22/2021: Findings:  +--------------------+----------+-----------------+--------+   AVF                  PSV (cm/s) Flow Vol (mL/min) Comments   +--------------------+----------+-----------------+--------+   Native artery inflow    332           1415                    +--------------------+----------+-----------------+--------+   AVF Anastomosis         642                                  +--------------------+----------+-----------------+--------+      +------------+----------+-------------+----------+-------------------------    OUTFLOW VEIN PSV (cm/s) Diameter (cm) Depth (cm)             +------------+----------+-------------+----------+-------------------------   Prox UA          82         1.23         2.08                              +------------+----------+-------------+----------+-------------------------   Mid UA          426         0.71         1.73    confluence with brachial vein   +------------+----------+-------------+----------+-------------------------   Dist UA         323         0.66         2.02                              +------------+----------+-------------+----------+-------------------------   AC Fossa        501         0.57         1.65                              +------------+----------+-------------+----------+-------------------------     Summary:  Patent right BVT.  no branching noted.  No significant stenosis noted.  No thrombus noted.    ASSESSMENT/PLAN: 66 y.o. female here for evaluation of her hemodialysis access with hx of right 1st stage BVT on 05/01/2021 for CKD IV by Dr. Virl Cagey.  She has advanced to ESRD now on HD via TDC right IJ.    -the fistula has matured nicely and is ready for 2nd stage BVT. Discussed with pt the reason she needs a second stage surgery and she expressed understanding. -pt is on dialysis M/W/F at [facility] location via right IJ [facility] that was placed at what sounds like [facility].  Once the fistula is transposed and incisions  have healed, the fistula will be able to be accessed.  Discussed with pt that after the fistula is working and center is happy with it, then they can schedule to have her TDC removed with [facility] -she does have some aching in her right hand.  She has an  easily palpable right radial pulse.  I discussed with her that I feel like this is most likely due to nerve irritation more than steal syndrome.  -discussed with pt that access does not last forever and will need intervention or even new access at some point.  -will get this scheduled on a day that is convenient for pt and when Dr. Virl Cagey has OR time.  She is in agreement.   -pt is not on anticoagulation   Leontine Locket, [facility] [phone]  Clinic MD:   Virl Cagey

## 2021-08-14 NOTE — ED Provider Notes (Signed)
[facility] EMERGENCY DEPARTMENT Provider Note   CSN: [id number] Arrival date & time: 08/14/21  0930     History  Chief Complaint  Patient presents with   Back Pain    Sherry [name] is a 66 y.o. female.  66 year old female with ESRD, attends dialysis M/W/F, last full session yesterday, reports acute on chronic right lower back pain, worse since last night. No loss of bowel or bladder habits denies leg weakness or groin paresthesia.  No recent falls or injuries, denies fevers.      Home Medications Prior to Admission medications   Medication Sig Start Date End Date Taking? Authorizing Provider  albuterol (VENTOLIN HFA) 108 (90 Base) MCG/ACT inhaler Inhale 2 puffs into the lungs every 4 (four) hours as needed for wheezing or shortness of breath. 04/23/21   [provider]  amLODipine (NORVASC) 10 MG tablet Take 10 mg by mouth in the morning.    [provider]  benzonatate (TESSALON) 100 MG capsule Take 1 capsule (100 mg total) by mouth every 8 (eight) hours. 06/23/21   Suzy Bouchard, PA-C  chlordiazePOXIDE (LIBRIUM) 25 MG capsule 50mg  PO TID x 1D, then 25-50mg  PO BID X 1D, then 25-50mg  PO QD X 1D Patient not taking: Reported on 06/23/2021 05/27/21   Deno Etienne, DO  Cholecalciferol (VITAMIN D3) 50 MCG (2000 UT) capsule Take 1 capsule (2,000 Units total) by mouth daily. 09/26/20   Wendie Agreste, MD  docusate sodium (COLACE) 100 MG capsule Take 100 mg by mouth daily as needed.    [provider]  ferrous sulfate 325 (65 FE) MG tablet Take 325 mg by mouth in the morning and at bedtime.    [provider]  FLUoxetine (PROZAC) 40 MG capsule Take 40 mg by mouth in the morning. 11/09/18   [provider]  furosemide (LASIX) 40 MG tablet Take 40 mg by mouth daily. 04/30/21   [provider]  gabapentin (NEURONTIN) 300 MG capsule Take 1 capsule (300 mg total) by mouth at bedtime. Patient not taking: Reported on 06/23/2021  05/13/21 06/12/21  Gareth Morgan, MD  gabapentin (NEURONTIN) 600 MG tablet Take 600 mg by mouth 2 (two) times daily. 06/08/21   [provider]  hydrALAZINE (APRESOLINE) 50 MG tablet Take 100 mg by mouth 3 (three) times daily. 03/24/21   [provider]  HYDROcodone-acetaminophen (NORCO/VICODIN) 5-325 MG tablet Take 1 tablet by mouth every 6 (six) hours as needed. 08/14/21   Tacy Learn, PA-C  INGREZZA 80 MG CAPS Take 80 mg by mouth at bedtime. 10/15/17   [provider]  isosorbide mononitrate (IMDUR) 60 MG 24 hr tablet Take 60 mg by mouth in the morning.    [provider]  labetalol (NORMODYNE) 300 MG tablet Take 300 mg by mouth 2 (two) times daily.    [provider]  lidocaine (LIDODERM) 5 % Place 1 patch onto the skin daily. Remove & Discard patch within 12 hours or as directed by MD 08/04/21   Tedd Sias, PA  LOKELMA 10 g PACK packet Take 1 packet by mouth daily. 05/01/21   [provider]  Melatonin 10 MG TABS Take 10 mg by mouth daily as needed (sleep).    [provider]  naproxen (NAPROSYN) 375 MG tablet Take 1 tablet (375 mg total) by mouth 2 (two) times daily. 08/04/21   [place], Kathleene Hazel, PA  ondansetron (ZOFRAN-ODT) 4 MG disintegrating tablet Take 4 mg by mouth every 8 (  eight) hours as needed for vomiting or nausea. 03/26/21   [provider]  pantoprazole (PROTONIX) 40 MG tablet Take 40 mg by mouth daily. 04/05/21   [provider]  sodium bicarbonate 650 MG tablet Take 1,300 mg by mouth 2 (two) times daily. 04/30/21   [provider]      Allergies    Abilify [aripiprazole], Remeron [mirtazapine], Trazodone and nefazodone, Flexeril [cyclobenzaprine], and Amoxicillin    Review of Systems   Negative except as per HPI  Physical Exam Updated Vital Signs BP (!) 193/87    Pulse 76    Temp 98.7 F (37.1 C) (Oral)    Resp 20    Ht 5\' 2"  (1.575 m)    Wt 90.3 kg    SpO2 98%    BMI 36.40 kg/m   Physical Exam Vitals and nursing note reviewed.  Cardiovascular:     Pulses: Normal pulses.  Pulmonary:     Effort: Pulmonary effort is normal.  Abdominal:     Palpations: Abdomen is soft.     Tenderness: There is no abdominal tenderness.  Musculoskeletal:        General: Tenderness present. No swelling or deformity.     Thoracic back: No tenderness or bony tenderness.     Lumbar back: Tenderness present. No bony tenderness. Positive right straight leg raise test.       Back:  Skin:    General: Skin is warm and dry.     Findings: No erythema, lesion or rash.  Neurological:     Sensory: No sensory deficit.     Motor: No weakness.     Gait: Gait normal.    ED Results / Procedures / Treatments   Labs (all labs ordered are listed, but only abnormal results are displayed) Labs Reviewed  URINALYSIS, ROUTINE W REFLEX MICROSCOPIC - Abnormal; Notable for the following components:      Result Value   pH 8.5 (*)    Protein, ur >300 (*)    All other components within normal limits  URINALYSIS, MICROSCOPIC (REFLEX) - Abnormal; Notable for the following components:   Bacteria, UA FEW (*)    All other components within normal limits    EKG None  Radiology No results found.  Procedures Procedures    Medications Ordered in ED Medications  oxyCODONE-acetaminophen (PERCOCET/ROXICET) 5-325 MG per tablet 1 tablet (1 tablet Oral Given 08/14/21 1140)    ED Course/ Medical Decision Making/ A&P                           Medical Decision Making Amount and/or Complexity of Data Reviewed Labs: ordered.   66 year old female with right lower back pain as above, acute on chronic in nature, no change in her chronic back pain.  Reviewed narcotic database, has had several small prescriptions filled by various providers, most recently June 23 had fourteen 5 mg oxycodone filled.  Patient is given 6 tablets of hydrocodone today after single dose of Percocet in the ED.  Advised to follow-up  with her primary care provider for further pain management and physical therapy.        Final Clinical Impression(s) / ED Diagnoses Final diagnoses:  Sciatica of right side    Rx / DC Orders ED Discharge Orders          Ordered    HYDROcodone-acetaminophen (NORCO/VICODIN) 5-325 MG tablet  Every 6 hours PRN,   Status:  Discontinued  08/14/21 1134    HYDROcodone-acetaminophen (NORCO/VICODIN) 5-325 MG tablet  Every 6 hours PRN        08/14/21 1142              Roque Lias 08/14/21 1143    Truddie Hidden, MD 08/16/21 [phone]

## 2021-08-14 NOTE — Discharge Instructions (Signed)
Follow-up with your doctor to arrange physical therapy as discussed. Limited supply of hydrocodone, you will need additional pain management from your doctor.

## 2021-08-22 ENCOUNTER — Ambulatory Visit (INDEPENDENT_AMBULATORY_CARE_PROVIDER_SITE_OTHER): Payer: Medicare Other | Admitting: Physician Assistant

## 2021-08-22 ENCOUNTER — Encounter: Payer: Self-pay | Admitting: Physician Assistant

## 2021-08-22 ENCOUNTER — Other Ambulatory Visit: Payer: Self-pay

## 2021-08-22 ENCOUNTER — Ambulatory Visit (HOSPITAL_COMMUNITY)
Admission: RE | Admit: 2021-08-22 | Discharge: 2021-08-22 | Disposition: A | Discharge status: Home / Self Care | Payer: Medicare Other | Source: Ambulatory Visit | Attending: Vascular Surgery | Admitting: Vascular Surgery

## 2021-08-22 VITALS — BP 158/77 | HR 62 | Temp 98.6°F | Resp 20 | Ht 62.0 in | Wt 201.4 lb

## 2021-08-22 DIAGNOSIS — N184 Chronic kidney disease, stage 4 (severe): Secondary | ICD-10-CM | POA: Insufficient documentation

## 2021-08-22 DIAGNOSIS — N186 End stage renal disease: Secondary | ICD-10-CM | POA: Diagnosis not present

## 2021-08-22 DIAGNOSIS — Z992 Dependence on renal dialysis: Secondary | ICD-10-CM | POA: Diagnosis not present

## 2021-08-22 HISTORY — DX: End stage renal disease: N18.6

## 2021-08-26 ENCOUNTER — Emergency Department (HOSPITAL_COMMUNITY)
Admission: EM | Admit: 2021-08-26 | Discharge: 2021-08-26 | Disposition: A | Discharge status: Home / Self Care | Payer: Medicare Other | Attending: Emergency Medicine | Admitting: Emergency Medicine

## 2021-08-26 ENCOUNTER — Other Ambulatory Visit: Payer: Self-pay

## 2021-08-26 ENCOUNTER — Emergency Department (HOSPITAL_COMMUNITY): Payer: Medicare Other

## 2021-08-26 ENCOUNTER — Other Ambulatory Visit (HOSPITAL_COMMUNITY): Payer: Self-pay

## 2021-08-26 DIAGNOSIS — R0602 Shortness of breath: Secondary | ICD-10-CM | POA: Diagnosis not present

## 2021-08-26 DIAGNOSIS — M546 Pain in thoracic spine: Secondary | ICD-10-CM | POA: Insufficient documentation

## 2021-08-26 DIAGNOSIS — N186 End stage renal disease: Secondary | ICD-10-CM | POA: Insufficient documentation

## 2021-08-26 DIAGNOSIS — F17210 Nicotine dependence, cigarettes, uncomplicated: Secondary | ICD-10-CM | POA: Diagnosis not present

## 2021-08-26 DIAGNOSIS — Z853 Personal history of malignant neoplasm of breast: Secondary | ICD-10-CM | POA: Diagnosis not present

## 2021-08-26 DIAGNOSIS — I5032 Chronic diastolic (congestive) heart failure: Secondary | ICD-10-CM | POA: Diagnosis not present

## 2021-08-26 DIAGNOSIS — R079 Chest pain, unspecified: Secondary | ICD-10-CM | POA: Diagnosis not present

## 2021-08-26 DIAGNOSIS — I132 Hypertensive heart and chronic kidney disease with heart failure and with stage 5 chronic kidney disease, or end stage renal disease: Secondary | ICD-10-CM | POA: Diagnosis not present

## 2021-08-26 LAB — COMPREHENSIVE METABOLIC PANEL
ALT: 11 U/L (ref 0–44)
AST: 15 U/L (ref 15–41)
Albumin: 3.7 g/dL (ref 3.5–5.0)
Alkaline Phosphatase: 92 U/L (ref 38–126)
Anion gap: 13 (ref 5–15)
BUN: 15 mg/dL (ref 8–23)
CO2: 28 mmol/L (ref 22–32)
Calcium: 9.4 mg/dL (ref 8.9–10.3)
Chloride: 98 mmol/L (ref 98–111)
Creatinine, Ser: 2.98 mg/dL — ABNORMAL HIGH (ref 0.44–1.00)
GFR, Estimated: 17 mL/min — ABNORMAL LOW (ref >60)
Glucose, Bld: 92 mg/dL (ref 70–99)
Potassium: 3.3 mmol/L — ABNORMAL LOW (ref 3.5–5.1)
Sodium: 139 mmol/L (ref 135–145)
Total Bilirubin: 0.3 mg/dL (ref 0.3–1.2)
Total Protein: 7.1 g/dL (ref 6.5–8.1)

## 2021-08-26 LAB — CBC
HCT: 30.6 % — ABNORMAL LOW (ref 36.0–46.0)
Hemoglobin: 9.4 g/dL — ABNORMAL LOW (ref 12.0–15.0)
MCH: 28.2 pg (ref 26.0–34.0)
MCHC: 30.7 g/dL (ref 30.0–36.0)
MCV: 91.9 fL (ref 80.0–100.0)
Platelets: 222 10*3/uL (ref 150–400)
RBC: 3.33 MIL/uL — ABNORMAL LOW (ref 3.87–5.11)
RDW: 15.2 % (ref 11.5–15.5)
WBC: 7.1 10*3/uL (ref 4.0–10.5)
nRBC: 0 % (ref 0.0–0.2)

## 2021-08-26 LAB — I-STAT CHEM 8, ED
BUN: 15 mg/dL (ref 8–23)
Calcium, Ion: 1.05 mmol/L — ABNORMAL LOW (ref 1.15–1.40)
Chloride: 98 mmol/L (ref 98–111)
Creatinine, Ser: 3.1 mg/dL — ABNORMAL HIGH (ref 0.44–1.00)
Glucose, Bld: 96 mg/dL (ref 70–99)
HCT: 30 % — ABNORMAL LOW (ref 36.0–46.0)
Hemoglobin: 10.2 g/dL — ABNORMAL LOW (ref 12.0–15.0)
Potassium: 3.1 mmol/L — ABNORMAL LOW (ref 3.5–5.1)
Sodium: 140 mmol/L (ref 135–145)
TCO2: 28 mmol/L (ref 22–32)

## 2021-08-26 LAB — TROPONIN I (HIGH SENSITIVITY)
Troponin I (High Sensitivity): 12 ng/L (ref <18)
Troponin I (High Sensitivity): 13 ng/L (ref <18)

## 2021-08-26 LAB — BRAIN NATRIURETIC PEPTIDE: B Natriuretic Peptide: 639.9 pg/mL — ABNORMAL HIGH (ref 0.0–100.0)

## 2021-08-26 LAB — PROTIME-INR
INR: 1 (ref 0.8–1.2)
Prothrombin Time: 13.1 seconds (ref 11.4–15.2)

## 2021-08-26 LAB — LACTIC ACID, PLASMA: Lactic Acid, Venous: 1.4 mmol/L (ref 0.5–1.9)

## 2021-08-26 LAB — LIPASE, BLOOD: Lipase: 56 U/L — ABNORMAL HIGH (ref 11–51)

## 2021-08-26 MED ORDER — ISOSORBIDE MONONITRATE ER 30 MG PO TB24
60.0000 mg | ORAL_TABLET | Freq: Every morning | ORAL | Status: DC
  Administered 2021-08-26: 60 mg via ORAL
  Filled 2021-08-26: qty 2

## 2021-08-26 MED ORDER — AMLODIPINE BESYLATE 5 MG PO TABS: 10.0000 mg | ORAL_TABLET | Freq: Every morning | ORAL | Status: DC

## 2021-08-26 MED ORDER — GABAPENTIN 600 MG PO TABS
600.0000 mg | ORAL_TABLET | Freq: Two times a day (BID) | ORAL | 0 refills | Status: DC
  Filled 2021-08-26: qty 60, 30d supply, fill #0

## 2021-08-26 MED ORDER — IOHEXOL 350 MG/ML SOLN
70.0000 mL | Freq: Once | INTRAVENOUS | Status: AC | PRN
  Administered 2021-08-26: 70 mL via INTRAVENOUS

## 2021-08-26 MED ORDER — HYDRALAZINE HCL 25 MG PO TABS
100.0000 mg | ORAL_TABLET | Freq: Three times a day (TID) | ORAL | Status: DC
  Administered 2021-08-26: 100 mg via ORAL
  Filled 2021-08-26: qty 4

## 2021-08-26 MED ORDER — HYDRALAZINE HCL 25 MG PO TABS: 100.0000 mg | ORAL_TABLET | Freq: Three times a day (TID) | ORAL | Status: DC

## 2021-08-26 MED ORDER — LABETALOL HCL 200 MG PO TABS
300.0000 mg | ORAL_TABLET | Freq: Two times a day (BID) | ORAL | Status: DC
  Administered 2021-08-26: 300 mg via ORAL
  Filled 2021-08-26: qty 2

## 2021-08-26 MED ORDER — HYDROMORPHONE HCL 1 MG/ML IJ SOLN
1.0000 mg | Freq: Once | INTRAMUSCULAR | Status: AC
  Administered 2021-08-26: 1 mg via INTRAVENOUS
  Filled 2021-08-26: qty 1

## 2021-08-26 MED ORDER — OXYCODONE-ACETAMINOPHEN 5-325 MG PO TABS
1.0000 | ORAL_TABLET | Freq: Once | ORAL | Status: AC
  Administered 2021-08-26: 1 via ORAL
  Filled 2021-08-26: qty 1

## 2021-08-26 MED ORDER — LABETALOL HCL 200 MG PO TABS: 300.0000 mg | ORAL_TABLET | Freq: Two times a day (BID) | ORAL | Status: DC

## 2021-08-26 MED ORDER — LABETALOL HCL 5 MG/ML IV SOLN
20.0000 mg | Freq: Once | INTRAVENOUS | Status: AC
  Administered 2021-08-26: 20 mg via INTRAVENOUS
  Filled 2021-08-26: qty 4

## 2021-08-26 MED ORDER — GABAPENTIN 300 MG PO CAPS
600.0000 mg | ORAL_CAPSULE | Freq: Two times a day (BID) | ORAL | Status: DC
  Administered 2021-08-26: 600 mg via ORAL
  Filled 2021-08-26: qty 2

## 2021-08-26 MED ORDER — ISOSORBIDE MONONITRATE ER 30 MG PO TB24: 60.0000 mg | ORAL_TABLET | Freq: Every morning | ORAL | Status: DC

## 2021-08-26 NOTE — ED Provider Notes (Addendum)
Signout note  66 year old lady presenting to ER with concern for chest pain and back pain.  Has history of ESRD, heart failure, alcohol use disorder.  EKG without ischemic change initial troponin normal.  CTA chest abdomen pelvis negative for dissection or other acute pathology. Per Sedonia Small - reviewed risks/benefits of contrast load in pt recently started on dialysis with radiologist and both agreed felt pros strongly outweighed cons and proceeded with study. Patient consented to study with contrast.  This am at time of signout, still hypertensive.  Plan to order her home medications, repeat troponin.  Final plan and disposition pending reassessment, monitoring of BP and repeat troponin.  Reassessed patient, her blood pressure has completely normalized after receiving her home medications.  Her pain has resolved after receiving home dose of her gabapentin as well as single dose of Percocet.  Her repeat troponin is normal.  Given patient does not have ongoing chest pain and her vital signs have normalized, and given the reassuring work-up today, do not feel she warrants admission at this time and feel she can be discharged and follow-up with her primary doctor and cardiology in an outpatient setting.  Scheduled for dialysis tomorrow, stressed need to keep appt given received IV contrast today.  Reviewed return precautions.  Patient has requested refill of her gabapentin dose, additionally requesting assistance with transportation home.  Will consult case management to assist.   Lucrezia Starch, MD 08/26/21 [id number]    Lucrezia Starch, MD 08/26/21 [phone]

## 2021-08-26 NOTE — ED Provider Notes (Signed)
[facility] Emergency Department Provider Note MRN:  [id number]  Arrival date & time: 08/26/21     Chief Complaint   Chest Pain and Shortness of Breath   History of Present Illness   Sherry [name] is a 66 y.o. year-old female with a history of alcohol use disorder, ESRD, CHF presenting to the ED with chief complaint of chest pain.  Thoracic back pain for 1 or 2 days, acute worsening with throbbing chest pain this evening.  Also with pain into the abdomen.  Hypertensive.  Review of Systems  A thorough review of systems was obtained and all systems are negative except as noted in the HPI and PMH.   Patient's Health History    Past Medical History:  Diagnosis Date   Alcohol abuse 05/09/2019   Alcohol use disorder, severe, dependence ([place]) 08/27/2017   Alcohol withdrawal ([place]) 08/09/2017   Anxiety    ARF (acute renal failure) ([place]) 04/09/2020   Arthropathy of lumbar facet joint 05/24/2020   Bilateral low back pain with bilateral sciatica 01/05/2019   Bipolar and related disorder ([place])    Blood transfusion without reported diagnosis    Body mass index (BMI) 40.0-44.9, adult ([place]) 07/19/2020   Breast cancer ([place])    right lumpectemy and lymph node    Cancer ([place]) 09/06/2017   Cervical spondylosis 05/24/2020   Chest pain 05/31/2020   Chest pain, rule out acute myocardial infarction 05/29/2020   CHF (congestive heart failure) (HCC)    Chronic back pain    Chronic bilateral low back pain with right-sided sciatica 05/24/2020   Chronic diastolic CHF (congestive heart failure) ([place]) 05/09/2019   Chronic kidney disease    "Stage IV" - states due to hypertension   Chronic kidney disease, stage III (moderate) ([place]) 11/17/2016   Chronic low back pain 09/06/2017   Disc levels:      No abnormality at L2-3 or above.      L3-4:  Mild bulging of the disc.  No stenosis.      L4-5: Bilateral facet arthropathy with gaping, fluid-filled joints.   Joint edema. Anterolisthesis would  likely occur at this level with   standing and flexion. Disc degeneration with mild bulging of the   disc. Mild narrowing of the lateral recesses and foramina, which   would likely worsen   Cigarette smoker 12/31/2017   CKD (chronic kidney disease) stage 4, GFR 15-29 ml/min (HCC) 04/18/2020   Clonus 05/24/2020   Cyst of ovary    Depression    Depression with anxiety 08/21/2016   Dizziness 02/24/2018   DOE (dyspnea on exertion) 12/30/2017   12/30/2017   Walked RA x one lap @ 185 stopped due to  Sob/ lightheaded weak with sats 96% at end - Spirometry 12/30/2017  FEV1 1.27 (68%)  Ratio 99 s curvature / abn effort dep portion only     Dyspnea    Dyspnea and respiratory abnormality 02/13/2013   Formatting of this note might be different from the original. STORY: PSG on 05/20/12 showed no OSA but upper airway resistance syndrome, AHI 3.9, severe snoring was noted. ESS=8. UARS. Advise positional therapy, weight loss program, regular exercise.   Essential hypertension 07/06/2017   GERD (gastroesophageal reflux disease)    Grief reaction with prolonged bereavement 09/06/2017   Partner of 18 yrs left 2018-pt admits she still loves her   Hepatitis C    recieved treatment for Hep C   Hepatitis C virus infection cured after antiviral drug therapy 12/17/2012  Telephone Encounter - Dub Mikes, RN - 12/28/2016 9:54 AM EDT Patient called for HCV RNA test results. EOT 08-26-16 - not detected. 12 week post treatment 12-11-16 - not detected. Informed patient that she was cured. Patient verbalized understanding.          History of breast cancer 02/13/2013   Hx of bipolar disorder 01/31/2016   Hypertension    Hypertensive emergency 10/21/2017   Impaired functional mobility, balance, gait, and endurance 01/09/2021   Left-sided weakness    MDD (major depressive disorder), recurrent severe, without psychosis ([place])    Medication intolerance 09/06/2017   Remeron-appetite stimulant/Dizziness/Dysphoria   Microcytic anemia 10/21/2017    Morbid obesity due to excess calories ([place]) 02/13/2013   BMI 42.9   MRSA infection    of breast incision   Nausea 04/09/2020   Formatting of this note might be different from the original. Added automatically from request for surgery [id number]   Near syncope 05/29/2020   Neck pain 05/24/2020   Neuroleptic-induced tardive dyskinesia 09/06/2017   Abilify   Opioid dependence ([place])    Has been treated in [facility] in past   Opioid use disorder, severe, dependence ([place]) 08/27/2017   Oral aphthous ulcer 06/25/2015   Last Assessment & Plan:  Formatting of this note might be different from the original. - Appear to be resolving as a be expected in the case of aphthous stomatitis. - Continue topical therapy with viscous lidocaine.  Recommended trying high-dose anti-inflammatory such as 600 mg of ibuprofen as needed. - She'll return to ENT clinic if symptoms do not resolve.   Phlebitis after infusion 10/28/2017   Rt antecubital fossa   Pre-operative cardiovascular examination 06/13/2018   Restless legs syndrome 02/13/2013   Formatting of this note might be different from the original. IMPRESSION: Possible. Will follow.   Spondylolisthesis, lumbar region 10/21/2020   Subacute dyskinesia due to drug 02/13/2013   Formatting of this note might be different from the original. STORY: Tardive dyskinesia and mild limb dyskinesia, LE>UE which likely due to prolonged antipsychotic used, abilify. Couldn't tolerate artane, gabapentin, clonazepam and xenazine.`E1o3L`IMPRESSION: Well tolerated depakote 250mg  bid, mood aspect is better as well. Will increase to 500mg  bid. ADR was discussed.  RTC 4-6 weeks.   Substance abuse ([place])    Substance induced mood disorder ([place]) 08/13/2017   TIA (transient ischemic attack) 2020   P/w BP >230/120 and neurologic symptoms, presumed TIA    Past Surgical History:  Procedure Laterality Date   AV FISTULA PLACEMENT Right 05/01/2021   Procedure: RIGHT ARTERIOVENOUS (AV) FISTULA  CREATION;  Surgeon: Broadus John, MD;  Location: [facility];  Service: Vascular;  Laterality: Right;  [place]   BREAST SURGERY Right 2010   "breast cancer survivor" - states partial mastectomy and nodes   COLONOSCOPY     [facility]   ESOPHAGOGASTRODUODENOSCOPY     [facility]   ESOPHAGOGASTRODUODENOSCOPY  04/18/2020   [facility]   LAPAROSCOPIC CHOLECYSTECTOMY  2002   LAPAROSCOPIC INCISIONAL / UMBILICAL / [place]  04/13/2018   with BARD 15x 93ZJ mesh (supraumbilical)   LAPAROSCOPIC LYSIS OF ADHESIONS  07/12/2018   Procedure: LAPAROSCOPIC LYSIS OF ADHESIONS;  Surgeon: Isabel Caprice, MD;  Location: [facility];  Service: Gynecology;;   MULTIPLE TOOTH EXTRACTIONS     MYOMECTOMY     x 2 prior to hysterectomy   ROBOTIC ASSISTED BILATERAL SALPINGO OOPHERECTOMY Right 07/12/2018   Procedure: XI ROBOTIC ASSISTED  RIGHT SALPINGO OOPHORECTOMY;  Surgeon: Isabel Caprice, MD;  Location: [facility];  Service: Gynecology;  Laterality: Right;   TOTAL ABDOMINAL HYSTERECTOMY     fibroids    UPPER GASTROINTESTINAL ENDOSCOPY     WISDOM TOOTH EXTRACTION      Family History  Problem Relation Age of Onset   Heart attack Mother    Breast cancer Mother 35   Dementia Mother    Cancer Father 67       died of bleeding from kidneys   Colon cancer Neg Hx    Esophageal cancer Neg Hx    Stomach cancer Neg Hx    Rectal cancer Neg Hx     Social History   Socioeconomic History   Marital status: Single    Spouse name: Not on file   Number of children: 0   Years of education: 12   Highest education level: High school graduate  Occupational History   Occupation: Disabled  Tobacco Use   Smoking status: Every Day    Packs/day: 0.50    Types: Cigarettes    Passive exposure: Never   Smokeless tobacco: Never  Vaping Use   Vaping Use: Former   Start date: 06/27/2013   Quit date: 06/14/2017   Devices: Apple Cinnamon-0 mg  Substance and Sexual  Activity   Alcohol use: Not Currently    Comment: hx over 30 years   Drug use: Not Currently    Comment: h/o IVD use   Sexual activity: Not Currently  Other Topics Concern   Not on file  Social History Narrative   Lives at home alone.   Right-handed.   Caffeine use: 4 cups coffee/soda   Social Determinants of Health   Financial Resource Strain: Not on file  Food Insecurity: Not on file  Transportation Needs: Not on file  Physical Activity: Not on file  Stress: Not on file  Social Connections: Not on file  Intimate Partner Violence: Not on file     Physical Exam   Vitals:   08/26/21 0545 08/26/21 0636  BP: (!) 201/78 (!) 193/74  Pulse: 68 68  Resp: 17 16  Temp:    SpO2: 97% 96%    CONSTITUTIONAL: Well-appearing, in moderate distress due to pain NEURO/PSYCH:  Alert and oriented x 3, no focal deficits EYES:  eyes equal and reactive ENT/NECK:  no LAD, no JVD CARDIO: Regular rate, well-perfused, normal S1 and S2 PULM:  CTAB no wheezing or rhonchi GI/GU:  non-distended, non-tender MSK/SPINE:  No gross deformities, no edema SKIN:  no rash, atraumatic   *Additional and/or pertinent findings included in MDM below  Diagnostic and Interventional Summary    EKG Interpretation  Date/Time:  Tuesday August 26 2021 04:21:50 EST Ventricular Rate:  74 PR Interval:  149 QRS Duration: 94 QT Interval:  457 QTC Calculation: 508 R Axis:   56 Text Interpretation: Sinus rhythm Probable left atrial enlargement Probable left ventricular hypertrophy Prolonged QT interval Confirmed by Gerlene Fee [id number]) on 08/26/2021 5:33:11 AM       Labs Reviewed  CBC - Abnormal; Notable for the following components:      Result Value   RBC 3.33 (*)    Hemoglobin 9.4 (*)    HCT 30.6 (*)    All other components within normal limits  I-STAT CHEM 8, ED - Abnormal; Notable for the following components:   Potassium 3.1 (*)    Creatinine, Ser 3.10 (*)    Calcium, Ion 1.05 (*)    Hemoglobin  10.2 (*)    HCT 30.0 (*)    All other components within normal limits  LACTIC ACID, PLASMA  PROTIME-INR  COMPREHENSIVE METABOLIC PANEL  LIPASE, BLOOD  BRAIN NATRIURETIC PEPTIDE  TROPONIN I (HIGH SENSITIVITY)  TROPONIN I (HIGH SENSITIVITY)    CT Angio Chest/Abd/Pel for Dissection W and/or Wo Contrast  Final Result    DG Chest Port 1 View  Final Result      Medications  HYDROmorphone (DILAUDID) injection 1 mg (1 mg Intravenous Given 08/26/21 0507)  labetalol (NORMODYNE) injection 20 mg (20 mg Intravenous Given 08/26/21 0541)  iohexol (OMNIPAQUE) 350 MG/ML injection 70 mL (70 mLs Intravenous Contrast Given 08/26/21 3734)     Procedures  /  Critical Care Procedures  ED Course and Medical Decision Making  Initial Impression and Ddx Initial concern for possible aortic dissection, also considering ACS, PE, fluid overloaded state in the setting of renal failure.  Awaiting labs, chest x-ray  Past medical/surgical history that increases complexity of ED encounter: ESRD  Interpretation of Diagnostics I personally reviewed the EKG and my interpretation is as follows: Sinus rhythm without significant changes from prior Clinical Course as of 08/26/21 0644  Tue Aug 26, 2021  0551 Patient still makes urine, has only been on dialysis for 2 months.  CT department requesting permission from nephrology to do this dissection study.  I spoke with Dr. Hollie Salk of nephrology who agrees with the plan to obtain CTA imaging.  I also spoke with Dr. Clovis Riley of radiology, who also agrees that the pros outweigh the cons of the study. [MB]  [id number] I also discussed the pros and cons with the patient, she is aware of the risk of worsening kidney function and she agrees with CT imaging. [MB]    Clinical Course User Index [MB] Maudie Flakes, MD    Awaiting laboratory assessment, CTA read.  Patient Reassessment and Ultimate Disposition/Management Signed out to oncoming provider.  Patient management required  discussion with the following services or consulting groups:  None  Complexity of Problems Addressed Acute illness or injury that poses threat of life of bodily function  Additional Data Reviewed and Analyzed Further history obtained from: Recent Consult notes  Additional Factors Impacting ED Encounter Risk Consideration of hospitalization  Barth Kirks. [place] Small, [place] [email or web address]  Final Clinical Impressions(s) / ED Diagnoses     ICD-10-CM   1. Chest pain, unspecified type  R07.9       ED Discharge Orders     None        Discharge Instructions Discussed with and Provided to Patient:   Discharge Instructions   None      Maudie Flakes, MD 08/26/21 [phone]

## 2021-08-26 NOTE — ED Triage Notes (Signed)
Pt arrived via [facility] from home for cc of shortness of breath, chest pain. Pt reporting central chest pain, shortness of breath, nausea increased pain with movement/breathing. Pt reports chest pain onset as increasing throughout the day. EMS report abdominal tenderness/mild distension. New dialysis patient (2 months), MWF, no missed appointments. Pt reporting chronic back pain as well.   EMS Vitals  BP 190/110 RR 18 HR 70 94% RA

## 2021-08-26 NOTE — Progress Notes (Signed)
°   08/26/21 0956  TOC ED Mini Assessment  TOC Time spent with patient (minutes): 30  PING Used in TOC Assessment Yes  Admission or Readmission Diverted Yes  Interventions which prevented an admission or readmission Medication Review;Transportation Screening  What brought you to the Emergency Department?  chest pain and back pain  Barriers to Discharge Transportation;ED Medication assistance  Means of Coin  Patient states their goals for this hospitalization and ongoing recovery are: get some medicine and go home   RNCM consulted in regards to medication assistance.  Pt has insurance coverage and is not eligible for Medication Assistance Through [facility]) program. [facility] will deliver Rx to patient at bedside prior to discharge from hospital.  [facility] consulted regarding transportation needs for pt.  RNCM provided [facility] as pt has no transportation from hospital.  RNCM presented and explained [place] and Release of Liability Form.  Pt signed waiver, therefore agreeing to written terms.

## 2021-08-26 NOTE — Discharge Instructions (Addendum)
Please follow-up with your primary care doctor and your cardiologist.  If you develop recurrent chest pain or difficulty in breathing, please come back to ER for reassessment.  Please get dialysis tomorrow as scheduled, do no skip.

## 2021-08-30 ENCOUNTER — Other Ambulatory Visit: Payer: Self-pay | Admitting: Family Medicine

## 2021-08-31 ENCOUNTER — Emergency Department (HOSPITAL_COMMUNITY)
Admission: EM | Admit: 2021-08-31 | Discharge: 2021-09-01 | Disposition: A | Discharge status: Home / Self Care | Payer: Medicare Other | Attending: Emergency Medicine | Admitting: Emergency Medicine

## 2021-08-31 ENCOUNTER — Emergency Department (HOSPITAL_COMMUNITY): Payer: Medicare Other

## 2021-08-31 DIAGNOSIS — Z20822 Contact with and (suspected) exposure to covid-19: Secondary | ICD-10-CM | POA: Insufficient documentation

## 2021-08-31 DIAGNOSIS — R079 Chest pain, unspecified: Secondary | ICD-10-CM

## 2021-08-31 DIAGNOSIS — R0602 Shortness of breath: Secondary | ICD-10-CM | POA: Insufficient documentation

## 2021-08-31 DIAGNOSIS — M545 Low back pain, unspecified: Secondary | ICD-10-CM | POA: Diagnosis not present

## 2021-08-31 LAB — CBC
HCT: 26.9 % — ABNORMAL LOW (ref 36.0–46.0)
Hemoglobin: 8.4 g/dL — ABNORMAL LOW (ref 12.0–15.0)
MCH: 28.8 pg (ref 26.0–34.0)
MCHC: 31.2 g/dL (ref 30.0–36.0)
MCV: 92.1 fL (ref 80.0–100.0)
Platelets: 209 10*3/uL (ref 150–400)
RBC: 2.92 MIL/uL — ABNORMAL LOW (ref 3.87–5.11)
RDW: 15.9 % — ABNORMAL HIGH (ref 11.5–15.5)
WBC: 7.7 10*3/uL (ref 4.0–10.5)
nRBC: 0 % (ref 0.0–0.2)

## 2021-08-31 LAB — TROPONIN I (HIGH SENSITIVITY): Troponin I (High Sensitivity): 12 ng/L (ref <18)

## 2021-08-31 LAB — BASIC METABOLIC PANEL
Anion gap: 10 (ref 5–15)
BUN: 25 mg/dL — ABNORMAL HIGH (ref 8–23)
CO2: 26 mmol/L (ref 22–32)
Calcium: 8.7 mg/dL — ABNORMAL LOW (ref 8.9–10.3)
Chloride: 103 mmol/L (ref 98–111)
Creatinine, Ser: 3.36 mg/dL — ABNORMAL HIGH (ref 0.44–1.00)
GFR, Estimated: 15 mL/min — ABNORMAL LOW (ref >60)
Glucose, Bld: 87 mg/dL (ref 70–99)
Potassium: 3.4 mmol/L — ABNORMAL LOW (ref 3.5–5.1)
Sodium: 139 mmol/L (ref 135–145)

## 2021-08-31 LAB — BRAIN NATRIURETIC PEPTIDE: B Natriuretic Peptide: 470 pg/mL — ABNORMAL HIGH (ref 0.0–100.0)

## 2021-08-31 MED ORDER — FENTANYL CITRATE PF 50 MCG/ML IJ SOSY
50.0000 ug | PREFILLED_SYRINGE | Freq: Once | INTRAMUSCULAR | Status: AC
  Administered 2021-09-01: 50 ug via INTRAVENOUS
  Filled 2021-08-31: qty 1

## 2021-08-31 NOTE — ED Provider Notes (Signed)
[facility] Emergency Department Provider Note MRN:  [id number]  Arrival date & time: 09/01/21     Chief Complaint   Shortness of Breath   History of Present Illness   Sherry [name] is a 66 y.o. year-old female presents to the ED with chief complaint of chest pain.  Patient states pain started earlier this morning.  She states that it is central and radiates to her back.  She reports associated shortness of breath.  She reports that she is on dialysis MWF, and states that this feels similar to when she is fluid overloaded.  She denies any fever, chills, or cough.  Denies any successful treatments prior to arrival.  Denies any numbness or weakness of the extremities.  History provided by patient.  Review of Systems  Pertinent review of systems noted in HPI.    Physical Exam   Vitals:   09/01/21 0015 09/01/21 0145  BP: (!) 174/69 (!) 197/78  Pulse: 67 72  Resp: (!) 29 (!) 35  Temp:    SpO2: 96% 94%    CONSTITUTIONAL:  Chronically ill-appearing, NAD NEURO:  Alert and oriented x 3, CN 3-12 grossly intact EYES:  eyes equal and reactive ENT/NECK:  Supple, no stridor  CARDIO:  normal rate, regular rhythm, appears well-perfused  PULM:  No respiratory distress, diminished lung sounds GI/GU:  non-distended,  MSK/SPINE:  No gross deformities, no edema, moves all extremities  SKIN:  no rash, atraumatic   *Additional and/or pertinent findings included in MDM below  Diagnostic and Interventional Summary    EKG Interpretation  Date/Time:  Sunday August 31 2021 22:24:27 EST Ventricular Rate:  66 PR Interval:  140 QRS Duration: 99 QT Interval:  458 QTC Calculation: 480 R Axis:   20 Text Interpretation: Sinus rhythm Left ventricular hypertrophy Confirmed by Lajean Saver [phone]) on 08/31/2021 10:33:44 PM       Labs Reviewed  BASIC METABOLIC PANEL - Abnormal; Notable for the following components:      Result Value   Potassium 3.4 (*)    BUN 25 (*)     Creatinine, Ser 3.36 (*)    Calcium 8.7 (*)    GFR, Estimated 15 (*)    All other components within normal limits  CBC - Abnormal; Notable for the following components:   RBC 2.92 (*)    Hemoglobin 8.4 (*)    HCT 26.9 (*)    RDW 15.9 (*)    All other components within normal limits  BRAIN NATRIURETIC PEPTIDE - Abnormal; Notable for the following components:   B Natriuretic Peptide 470.0 (*)    All other components within normal limits  RESP PANEL BY RT-PCR (FLU A&B, COVID) [id number]  TROPONIN I (HIGH SENSITIVITY)  TROPONIN I (HIGH SENSITIVITY)    DG Chest Port 1 View  Final Result      Medications  HYDROmorphone (DILAUDID) injection 0.5 mg (has no administration in time range)  fentaNYL (SUBLIMAZE) injection 50 mcg (50 mcg Intravenous Given 09/01/21 0028)  fentaNYL (SUBLIMAZE) injection 50 mcg (50 mcg Intravenous Given 09/01/21 0122)     Procedures  /  Critical Care Procedures  ED Course and Medical Decision Making  I have reviewed the triage vital signs, the nursing notes, and pertinent available records from the EMR.  Complexity of Problems Addressed: High Complexity: Acute illness/injury posing a threat to life or bodily function, requiring emergent diagnostic workup, evaluation, and treatment as below.  ED Course:    After considering the following differential, ACS, PE,  volume overload, COVID; I ordered basic labs and troponins.  I personally interpreted the labs which are notable for negative troponins, potassium is not elevated I visualized the chest x-ray which is notable for no evidence of fluid overload and agree with the radiologist interpretation. EKG I observed the patient while on the cardiac monitor and noted no significant dysrhythmias..  Patient here with chest pain and back pain.  Patient has been seen here several times for the same.  It seems that patient's back pain is her primary complaint, and this seems to be chronic in nature.  She repeatedly asks  for additional pain medication for this, and states that Dilaudid is the only thing that has helped her.  I will give her a one-time dose of 0.5 mg IV Dilaudid, but I have notified her that we do not traditionally treat chronic pain in the emergency department.  Consultants: No consultations were needed in caring for this patient.   Treatment and Plan:  Patient will need to follow-up at her dialysis center.  No further emergent work-up indicated.  She should follow-up with her PCP for chronic back pain.  I considered admission due to patient's initial presentation, but after considering the examination and diagnostic results, patient will not require admission and can be discharged with outpatient follow-up.  Patient seen by and discussed with attending physician, Dr. Leonette Monarch, who agrees with plan for discharge.  Final Clinical Impressions(s) / ED Diagnoses     ICD-10-CM   1. Acute low back pain, unspecified back pain laterality, unspecified whether sciatica present  M54.50     2. Chest pain, unspecified type  R07.9       ED Discharge Orders          Ordered    lidocaine (LIDODERM) 5 %  Every 24 hours        09/01/21 0118             Discharge Instructions Discussed with and Provided to Patient:     Discharge Instructions      Keep your dialysis appointment.  Follow-up with your doctor.  Return as needed for worsening symptoms.  Apply ice and heat to help with pain.  I've also sent lidoderm patches to the pharmacy.       Montine Circle, PA-C 09/01/21 0227    Fatima Blank, MD 09/01/21 [phone]

## 2021-08-31 NOTE — ED Triage Notes (Signed)
Pt arrives to ED BIB [facility] for [facility] that started today. Per Pt is a Dialysis MWF.

## 2021-09-01 ENCOUNTER — Encounter (HOSPITAL_COMMUNITY): Payer: Self-pay | Admitting: Vascular Surgery

## 2021-09-01 LAB — RESP PANEL BY RT-PCR (FLU A&B, COVID) ARPGX2
Influenza A by PCR: NEGATIVE
Influenza B by PCR: NEGATIVE
SARS Coronavirus 2 by RT PCR: NEGATIVE

## 2021-09-01 LAB — TROPONIN I (HIGH SENSITIVITY): Troponin I (High Sensitivity): 11 ng/L (ref <18)

## 2021-09-01 MED ORDER — LIDOCAINE 5 % EX PTCH
1.0000 | MEDICATED_PATCH | CUTANEOUS | 0 refills | Status: DC
Start: 2021-09-01 — End: 2021-12-27

## 2021-09-01 MED ORDER — FENTANYL CITRATE PF 50 MCG/ML IJ SOSY
50.0000 ug | PREFILLED_SYRINGE | Freq: Once | INTRAMUSCULAR | Status: AC
  Administered 2021-09-01: 50 ug via INTRAVENOUS
  Filled 2021-09-01: qty 1

## 2021-09-01 MED ORDER — HYDROMORPHONE HCL 1 MG/ML IJ SOLN
0.5000 mg | Freq: Once | INTRAMUSCULAR | Status: AC
  Administered 2021-09-01: 0.5 mg via INTRAVENOUS
  Filled 2021-09-01: qty 1

## 2021-09-01 NOTE — Discharge Instructions (Addendum)
Keep your dialysis appointment.  Follow-up with your doctor.  Return as needed for worsening symptoms.  Apply ice and heat to help with pain.  I've also sent lidoderm patches to the pharmacy.

## 2021-09-01 NOTE — Anesthesia Preprocedure Evaluation (Addendum)
Anesthesia Evaluation  Patient identified by MRN, date of birth, ID band Patient awake    Reviewed: Allergy & Precautions, NPO status , Patient's Chart, lab work & pertinent test results, reviewed documented beta blocker date and time   Airway Mallampati: III  TM Distance: >3 FB Neck ROM: Full    Dental  (+) Dental Advisory Given, Edentulous Lower, Edentulous Upper   Pulmonary shortness of breath, Current Smoker and Patient abstained from smoking.,    Pulmonary exam normal breath sounds clear to auscultation       Cardiovascular hypertension, Pt. on home beta blockers and Pt. on medications +CHF and + DOE  Normal cardiovascular exam Rhythm:Regular Rate:Normal     Neuro/Psych PSYCHIATRIC DISORDERS Anxiety Depression Neuroleptic-induced tardive dyskinesia TIA Neuromuscular disease    GI/Hepatic GERD  Medicated,(+)     substance abuse  alcohol use, Hepatitis - (s/p treatment), C  Endo/Other  Obesity   Renal/GU ESRF and DialysisRenal disease (MWF)     Musculoskeletal  (+) Arthritis , narcotic dependent  Abdominal   Peds  Hematology  (+) Blood dyscrasia, anemia ,   Anesthesia Other Findings   Reproductive/Obstetrics                           Anesthesia Physical Anesthesia Plan  ASA: 3  Anesthesia Plan: Regional   Post-op Pain Management: Tylenol PO (pre-op)*   Induction: Intravenous  PONV Risk Score and Plan: 1 and Propofol infusion, TIVA, Ondansetron and Dexamethasone  Airway Management Planned: Natural Airway and Nasal Cannula  Additional Equipment:   Intra-op Plan:   Post-operative Plan:   Informed Consent: I have reviewed the patients History and Physical, chart, labs and discussed the procedure including the risks, benefits and alternatives for the proposed anesthesia with the patient or authorized representative who has indicated his/her understanding and acceptance.      Dental advisory given  Plan Discussed with: CRNA  Anesthesia Plan Comments: (PAT note by Karoline Caldwell, PA-C:  History of HFpEF, uncontrolled hypertension (TIA 2020 with hypertensive urgency), morbid obesity, frequent ED visits for chest pain with negative work-ups. Seen by cardiologist Dr. Geraldo Pitter January 2022 for preoperative stratification prior to undergoing lumbar fusion. He recommended preop Lexiscan as stated that if the test was negative she would not be a high risk for coronary events. Nuclear stress done 08/06/2020 was nonischemic, low risk, EF 59%.  She did undergo lumbar fusion 01/01/2978 without complication.  History of hepatitis C status post treatment with antiviral medications.  ESRD on HD via PermCath Monday Wednesday Friday.  Recently underwent first stageAV fistula creation 05/01/2021.  She is followed by nephrologist Dr. Carolin Sicks.  History of alcohol abuse and polysubstance abuse, denies currently.  BMP and CBC from ED visit 08/31/2021 reviewed, creatinine 3.36 consistent with ESRD, chronic anemia with hemoglobin 8.4.  Labs otherwise unremarkable.  Pt will need DOS eval.  EKG 06/30/22: Sinus rhythm. Rate 66. Left ventricular hypertrophy  CHEST - 2 VIEW1/13/2022:  COMPARISON: 05/29/2020  FINDINGS: Stable mild cardiomegaly. No focal airspace consolidation, pleural effusion, or pneumothorax. Cervical ACDF.  IMPRESSION: No active cardiopulmonary disease.  Nuclear stress 08/06/2020:  There was no ST segment deviation noted during stress.  Nuclear stress EF: 59%.  The left ventricular ejection fraction is normal (55-65%).  The study is normal.  This is a low risk study.  TTE 05/30/2020: 1. Left ventricular ejection fraction, by estimation, is 60 to 65%. The  left ventricle has normal function. The left ventricle has  no regional  wall motion abnormalities. There is mild left ventricular hypertrophy.  Left ventricular diastolic  parameters  are consistent with Grade II diastolic dysfunction (pseudonormalization).  Elevated left atrial pressure.  2. Right ventricular systolic function is normal. The right ventricular  size is normal. Tricuspid regurgitation signal is inadequate for assessing  PA pressure.  3. Left atrial size was mildly dilated.  4. The mitral valve is abnormal. No evidence of mitral valve  regurgitation. Moderate mitral annular calcification.  5. The aortic valve is tricuspid. Aortic valve regurgitation is not  visualized. No aortic stenosis is present.  6. The inferior vena cava is normal in size with greater than 50%  respiratory variability, suggesting right atrial pressure of 3 mmHg.   Carotid ultrasound 05/09/2019: Summary:  Right Carotid: Velocities in the right ICA are consistent with a 1-39% stenosis.   Left Carotid: Velocities in the left ICA are consistent with a 1-39% stenosis.   Vertebrals: Bilateral vertebral arteries demonstrate antegrade flow.  Subclavians: Normal flow hemodynamics were seen in bilateral subclavian arteries.   )      Anesthesia Quick Evaluation

## 2021-09-01 NOTE — Progress Notes (Signed)
Anesthesia Chart Review: Same day workup  History of HFpEF, uncontrolled hypertension (TIA 2020 with hypertensive urgency), morbid obesity, frequent ED visits for chest pain with negative work-ups.  Seen by cardiologist Dr. Geraldo Pitter January 2022 for preoperative stratification prior to undergoing lumbar fusion.  He recommended preop Lexiscan as stated that if the test was negative she would not be a high risk for coronary events.  Nuclear stress done 08/06/2020 was nonischemic, low risk, EF 59%.  She did undergo lumbar fusion 10/26/8307 without complication.   History of hepatitis C status post treatment with antiviral medications.   ESRD on HD via PermCath Monday Wednesday Friday.  Recently underwent first stage AV fistula creation 05/01/2021.  She is followed by nephrologist Dr. Carolin Sicks.     History of alcohol abuse and polysubstance abuse, denies currently.  BMP and CBC from ED visit 08/31/2021 reviewed, creatinine 3.36 consistent with ESRD, chronic anemia with hemoglobin 8.4.  Labs otherwise unremarkable.  Pt will need DOS eval.   EKG 06/30/22: Sinus rhythm. Rate 66. Left ventricular hypertrophy   CHEST - 2 VIEW 07/25/2020:   COMPARISON:  05/29/2020   FINDINGS: Stable mild cardiomegaly. No focal airspace consolidation, pleural effusion, or pneumothorax. Cervical ACDF.   IMPRESSION: No active cardiopulmonary disease.   Nuclear stress 08/06/2020: There was no ST segment deviation noted during stress. Nuclear stress EF: 59%. The left ventricular ejection fraction is normal (55-65%). The study is normal. This is a low risk study.   TTE 05/30/2020:  1. Left ventricular ejection fraction, by estimation, is 60 to 65%. The  left ventricle has normal function. The left ventricle has no regional  wall motion abnormalities. There is mild left ventricular hypertrophy.  Left ventricular diastolic parameters  are consistent with Grade II diastolic dysfunction (pseudonormalization).   Elevated left atrial pressure.   2. Right ventricular systolic function is normal. The right ventricular  size is normal. Tricuspid regurgitation signal is inadequate for assessing  PA pressure.   3. Left atrial size was mildly dilated.   4. The mitral valve is abnormal. No evidence of mitral valve  regurgitation. Moderate mitral annular calcification.   5. The aortic valve is tricuspid. Aortic valve regurgitation is not  visualized. No aortic stenosis is present.   6. The inferior vena cava is normal in size with greater than 50%  respiratory variability, suggesting right atrial pressure of 3 mmHg.    Carotid ultrasound 05/09/2019: Summary:  Right Carotid: Velocities in the right ICA are consistent with a 1-39%  stenosis.   Left Carotid: Velocities in the left ICA are consistent with a 1-39% stenosis.   Vertebrals:  Bilateral vertebral arteries demonstrate antegrade flow.  Subclavians: Normal flow hemodynamics were seen in bilateral subclavian arteries.     Wynonia Musty [facility] Short Stay Center/Anesthesiology Phone [phone] 09/01/2021 1:04 PM

## 2021-09-01 NOTE — Progress Notes (Signed)
After multiple attempts, I was not able to reach patient via phone.  Called the office to verify phone number, however, the office closed at 4:30 PM.    DUE TO COVID-19 ONLY ONE VISITOR IS ALLOWED TO COME WITH YOU AND STAY IN THE WAITING ROOM ONLY DURING PRE OP AND PROCEDURE DAY OF SURGERY.   PCP - Dr Merri Ray Cardiologist - Dr Sunny Schlein Revankar  Chest x-ray - 08/31/21 (1V) EKG - 08/31/21 Stress Test - 08/06/20 ECHO - 05/30/20 Cardiac Cath - n/a  ICD Pacemaker/Loop - n/a  Sleep Study -  n/a CPAP - none  Anesthesia review: Yes  STOP now taking any Aspirin (unless otherwise instructed by your surgeon), Aleve, Naproxen, Ibuprofen, Motrin, Advil, Goody's, BC's, all herbal medications, fish oil, and all vitamins.   Coronavirus Screening Covid test n/a Ambulatory Surgery.  Patient was seen at the ED department on 09/01/21 and had a Covid test was negative.

## 2021-09-02 ENCOUNTER — Encounter (HOSPITAL_COMMUNITY)
Admission: RE | Disposition: A | Discharge status: Home / Self Care | Payer: Self-pay | Source: Home / Self Care | Attending: Vascular Surgery

## 2021-09-02 ENCOUNTER — Ambulatory Visit (HOSPITAL_COMMUNITY)
Admission: RE | Admit: 2021-09-02 | Discharge: 2021-09-02 | Disposition: A | Discharge status: Home / Self Care | Payer: Medicare Other | Attending: Vascular Surgery | Admitting: Vascular Surgery

## 2021-09-02 ENCOUNTER — Ambulatory Visit (HOSPITAL_BASED_OUTPATIENT_CLINIC_OR_DEPARTMENT_OTHER): Payer: Medicare Other | Admitting: Physician Assistant

## 2021-09-02 ENCOUNTER — Other Ambulatory Visit: Payer: Self-pay

## 2021-09-02 ENCOUNTER — Ambulatory Visit (HOSPITAL_COMMUNITY): Payer: Medicare Other | Admitting: Physician Assistant

## 2021-09-02 ENCOUNTER — Encounter (HOSPITAL_COMMUNITY): Payer: Self-pay | Admitting: Vascular Surgery

## 2021-09-02 DIAGNOSIS — N185 Chronic kidney disease, stage 5: Secondary | ICD-10-CM | POA: Diagnosis not present

## 2021-09-02 DIAGNOSIS — T82590A Other mechanical complication of surgically created arteriovenous fistula, initial encounter: Secondary | ICD-10-CM

## 2021-09-02 DIAGNOSIS — F17210 Nicotine dependence, cigarettes, uncomplicated: Secondary | ICD-10-CM | POA: Diagnosis not present

## 2021-09-02 DIAGNOSIS — I132 Hypertensive heart and chronic kidney disease with heart failure and with stage 5 chronic kidney disease, or end stage renal disease: Secondary | ICD-10-CM | POA: Insufficient documentation

## 2021-09-02 DIAGNOSIS — Z6836 Body mass index (BMI) 36.0-36.9, adult: Secondary | ICD-10-CM | POA: Diagnosis not present

## 2021-09-02 DIAGNOSIS — I509 Heart failure, unspecified: Secondary | ICD-10-CM

## 2021-09-02 DIAGNOSIS — T82898A Other specified complication of vascular prosthetic devices, implants and grafts, initial encounter: Secondary | ICD-10-CM | POA: Diagnosis not present

## 2021-09-02 DIAGNOSIS — I5032 Chronic diastolic (congestive) heart failure: Secondary | ICD-10-CM | POA: Insufficient documentation

## 2021-09-02 DIAGNOSIS — Z79899 Other long term (current) drug therapy: Secondary | ICD-10-CM | POA: Insufficient documentation

## 2021-09-02 DIAGNOSIS — K219 Gastro-esophageal reflux disease without esophagitis: Secondary | ICD-10-CM | POA: Diagnosis not present

## 2021-09-02 DIAGNOSIS — Z992 Dependence on renal dialysis: Secondary | ICD-10-CM | POA: Insufficient documentation

## 2021-09-02 DIAGNOSIS — N186 End stage renal disease: Secondary | ICD-10-CM

## 2021-09-02 HISTORY — PX: BASCILIC VEIN TRANSPOSITION: SHX5742

## 2021-09-02 LAB — POCT I-STAT, CHEM 8
BUN: 18 mg/dL (ref 8–23)
Calcium, Ion: 1.06 mmol/L — ABNORMAL LOW (ref 1.15–1.40)
Chloride: 99 mmol/L (ref 98–111)
Creatinine, Ser: 3.1 mg/dL — ABNORMAL HIGH (ref 0.44–1.00)
Glucose, Bld: 104 mg/dL — ABNORMAL HIGH (ref 70–99)
HCT: 30 % — ABNORMAL LOW (ref 36.0–46.0)
Hemoglobin: 10.2 g/dL — ABNORMAL LOW (ref 12.0–15.0)
Potassium: 3.3 mmol/L — ABNORMAL LOW (ref 3.5–5.1)
Sodium: 142 mmol/L (ref 135–145)
TCO2: 30 mmol/L (ref 22–32)

## 2021-09-02 SURGERY — TRANSPOSITION, VEIN, BASILIC
Anesthesia: Regional | Site: Arm Upper | Laterality: Right

## 2021-09-02 MED ORDER — CHLORHEXIDINE GLUCONATE 0.12 % MT SOLN: 15.0000 mL | Freq: Once | OROMUCOSAL | Status: DC

## 2021-09-02 MED ORDER — ACETAMINOPHEN 10 MG/ML IV SOLN
INTRAVENOUS | Status: DC | PRN
  Administered 2021-09-02: 1000 mg via INTRAVENOUS

## 2021-09-02 MED ORDER — HEPARIN 6000 UNIT IRRIGATION SOLUTION
Status: DC | PRN
  Administered 2021-09-02: 1

## 2021-09-02 MED ORDER — VANCOMYCIN HCL IN DEXTROSE 1-5 GM/200ML-% IV SOLN
INTRAVENOUS | Status: AC
  Filled 2021-09-02: qty 200

## 2021-09-02 MED ORDER — ROPIVACAINE HCL 5 MG/ML IJ SOLN
INTRAMUSCULAR | Status: DC | PRN
  Administered 2021-09-02: 30 mL via PERINEURAL

## 2021-09-02 MED ORDER — MIDAZOLAM HCL 2 MG/2ML IJ SOLN
INTRAMUSCULAR | Status: AC
  Filled 2021-09-02: qty 2

## 2021-09-02 MED ORDER — HEPARIN SODIUM (PORCINE) 1000 UNIT/ML IJ SOLN
1.6000 mL | Freq: Once | INTRAMUSCULAR | Status: AC
  Administered 2021-09-02: 1600 [IU] via INTRAVENOUS
  Filled 2021-09-02: qty 1.6

## 2021-09-02 MED ORDER — AMLODIPINE BESYLATE 10 MG PO TABS
10.0000 mg | ORAL_TABLET | Freq: Once | ORAL | Status: AC
  Administered 2021-09-02: 10 mg via ORAL
  Filled 2021-09-02: qty 1

## 2021-09-02 MED ORDER — ORAL CARE MOUTH RINSE: 15.0000 mL | Freq: Once | OROMUCOSAL | Status: DC

## 2021-09-02 MED ORDER — VANCOMYCIN HCL IN DEXTROSE 1-5 GM/200ML-% IV SOLN
1000.0000 mg | INTRAVENOUS | Status: AC
  Administered 2021-09-02: 1000 mg via INTRAVENOUS

## 2021-09-02 MED ORDER — LABETALOL HCL 300 MG PO TABS
300.0000 mg | ORAL_TABLET | Freq: Once | ORAL | Status: AC
  Administered 2021-09-02: 300 mg via ORAL
  Filled 2021-09-02: qty 1

## 2021-09-02 MED ORDER — FENTANYL CITRATE (PF) 250 MCG/5ML IJ SOLN
INTRAMUSCULAR | Status: AC
  Filled 2021-09-02: qty 5

## 2021-09-02 MED ORDER — 0.9 % SODIUM CHLORIDE (POUR BTL) OPTIME
TOPICAL | Status: DC | PRN
  Administered 2021-09-02: 1000 mL

## 2021-09-02 MED ORDER — GLYCOPYRROLATE PF 0.2 MG/ML IJ SOSY
PREFILLED_SYRINGE | INTRAMUSCULAR | Status: AC
  Filled 2021-09-02: qty 1

## 2021-09-02 MED ORDER — DEXAMETHASONE SODIUM PHOSPHATE 10 MG/ML IJ SOLN
INTRAMUSCULAR | Status: AC
  Filled 2021-09-02: qty 1

## 2021-09-02 MED ORDER — ONDANSETRON HCL 4 MG/2ML IJ SOLN: 4.0000 mg | Freq: Once | INTRAMUSCULAR | Status: DC | PRN

## 2021-09-02 MED ORDER — MIDAZOLAM HCL 2 MG/2ML IJ SOLN
INTRAMUSCULAR | Status: DC | PRN
  Administered 2021-09-02: 2 mg via INTRAVENOUS

## 2021-09-02 MED ORDER — LIDOCAINE HCL (PF) 1 % IJ SOLN
INTRAMUSCULAR | Status: AC
  Filled 2021-09-02: qty 30

## 2021-09-02 MED ORDER — HEPARIN SODIUM (PORCINE) 1000 UNIT/ML IJ SOLN
INTRAMUSCULAR | Status: DC | PRN
  Administered 2021-09-02: 2000 [IU] via INTRAVENOUS

## 2021-09-02 MED ORDER — PROPOFOL 500 MG/50ML IV EMUL
INTRAVENOUS | Status: DC | PRN
  Administered 2021-09-02: 200 ug/kg/min via INTRAVENOUS

## 2021-09-02 MED ORDER — GLYCOPYRROLATE 0.2 MG/ML IJ SOLN
INTRAMUSCULAR | Status: DC | PRN
  Administered 2021-09-02: .2 mg via INTRAVENOUS

## 2021-09-02 MED ORDER — FENTANYL CITRATE (PF) 100 MCG/2ML IJ SOLN
INTRAMUSCULAR | Status: AC
  Filled 2021-09-02: qty 2

## 2021-09-02 MED ORDER — CHLORHEXIDINE GLUCONATE 4 % EX LIQD: 60.0000 mL | Freq: Once | CUTANEOUS | Status: DC

## 2021-09-02 MED ORDER — PROPOFOL 10 MG/ML IV BOLUS
INTRAVENOUS | Status: AC
  Filled 2021-09-02: qty 20

## 2021-09-02 MED ORDER — OXYCODONE-ACETAMINOPHEN 5-325 MG PO TABS: 1.0000 | ORAL_TABLET | Freq: Four times a day (QID) | ORAL | 0 refills | Status: DC | PRN

## 2021-09-02 MED ORDER — HEPARIN SODIUM (PORCINE) 1000 UNIT/ML IJ SOLN
INTRAMUSCULAR | Status: AC
  Filled 2021-09-02: qty 10

## 2021-09-02 MED ORDER — LACTATED RINGERS IV SOLN: INTRAVENOUS | Status: DC

## 2021-09-02 MED ORDER — CHLORHEXIDINE GLUCONATE 0.12 % MT SOLN
OROMUCOSAL | Status: AC
  Filled 2021-09-02: qty 15

## 2021-09-02 MED ORDER — SODIUM CHLORIDE 0.9 % IV SOLN: INTRAVENOUS | Status: DC | PRN

## 2021-09-02 MED ORDER — HEPARIN 6000 UNIT IRRIGATION SOLUTION
Status: AC
  Filled 2021-09-02: qty 500

## 2021-09-02 MED ORDER — ONDANSETRON HCL 4 MG/2ML IJ SOLN
INTRAMUSCULAR | Status: DC | PRN
  Administered 2021-09-02: 4 mg via INTRAVENOUS

## 2021-09-02 MED ORDER — FENTANYL CITRATE (PF) 100 MCG/2ML IJ SOLN
25.0000 ug | INTRAMUSCULAR | Status: DC | PRN
  Administered 2021-09-02: 50 ug via INTRAVENOUS
  Administered 2021-09-02: 25 ug via INTRAVENOUS

## 2021-09-02 MED ORDER — SODIUM CHLORIDE 0.9 % IV SOLN: INTRAVENOUS | Status: DC

## 2021-09-02 MED ORDER — FENTANYL CITRATE (PF) 250 MCG/5ML IJ SOLN
INTRAMUSCULAR | Status: DC | PRN
  Administered 2021-09-02: 50 ug via INTRAVENOUS

## 2021-09-02 MED ORDER — ONDANSETRON HCL 4 MG/2ML IJ SOLN
INTRAMUSCULAR | Status: AC
  Filled 2021-09-02: qty 2

## 2021-09-02 MED ORDER — DEXAMETHASONE SODIUM PHOSPHATE 10 MG/ML IJ SOLN
INTRAMUSCULAR | Status: DC | PRN
  Administered 2021-09-02: 10 mg via INTRAVENOUS

## 2021-09-02 MED ORDER — SODIUM CHLORIDE 0.9 % IV SOLN
20.0000 ug | INTRAVENOUS | Status: AC
  Administered 2021-09-02: 20 ug via INTRAVENOUS
  Filled 2021-09-02: qty 5

## 2021-09-02 SURGICAL SUPPLY — 48 items
ADH SKN CLS APL DERMABOND .7 (GAUZE/BANDAGES/DRESSINGS) ×1
ADH SKN CLS LQ APL DERMABOND (GAUZE/BANDAGES/DRESSINGS) ×1
ARMBAND PINK RESTRICT EXTREMIT (MISCELLANEOUS) ×2 IMPLANT
BAG COUNTER SPONGE SURGICOUNT (BAG) ×2 IMPLANT
BAG SPNG CNTER NS LX DISP (BAG) ×1
BNDG ELASTIC 4X5.8 VLCR STR LF (GAUZE/BANDAGES/DRESSINGS) ×1 IMPLANT
CANISTER SUCT 3000ML PPV (MISCELLANEOUS) ×2 IMPLANT
CLIP LIGATING EXTRA MED SLVR (CLIP) ×2 IMPLANT
CLIP LIGATING EXTRA SM BLUE (MISCELLANEOUS) ×2 IMPLANT
COVER PROBE W GEL 5X96 (DRAPES) ×2 IMPLANT
DECANTER SPIKE VIAL GLASS SM (MISCELLANEOUS) ×2 IMPLANT
DERMABOND ADHESIVE PROPEN (GAUZE/BANDAGES/DRESSINGS) ×1
DERMABOND ADVANCED (GAUZE/BANDAGES/DRESSINGS) ×1
DERMABOND ADVANCED .7 DNX12 (GAUZE/BANDAGES/DRESSINGS) ×1 IMPLANT
DERMABOND ADVANCED .7 DNX6 (GAUZE/BANDAGES/DRESSINGS) IMPLANT
ELECT REM PT RETURN 9FT ADLT (ELECTROSURGICAL) ×2
ELECTRODE REM PT RTRN 9FT ADLT (ELECTROSURGICAL) ×1 IMPLANT
GAUZE 4X4 16PLY ~~LOC~~+RFID DBL (SPONGE) ×1 IMPLANT
GLOVE SRG 8 PF TXTR STRL LF DI (GLOVE) ×2 IMPLANT
GLOVE SURG POLYISO LF SZ6.5 (GLOVE) ×3 IMPLANT
GLOVE SURG POLYISO LF SZ8 (GLOVE) IMPLANT
GLOVE SURG PR MICRO ENCORE 7 (GLOVE) ×4 IMPLANT
GLOVE SURG UNDER POLY LF SZ7 (GLOVE) ×3 IMPLANT
GLOVE SURG UNDER POLY LF SZ8 (GLOVE) ×4
GOWN STRL REUS W/ TWL LRG LVL3 (GOWN DISPOSABLE) ×2 IMPLANT
GOWN STRL REUS W/TWL 2XL LVL3 (GOWN DISPOSABLE) ×2 IMPLANT
GOWN STRL REUS W/TWL LRG LVL3 (GOWN DISPOSABLE) ×14
KIT BASIN OR (CUSTOM PROCEDURE TRAY) ×2 IMPLANT
KIT TURNOVER KIT B (KITS) ×2 IMPLANT
NDL HYPO 25GX1X1/2 BEV (NEEDLE) ×1 IMPLANT
NEEDLE HYPO 25GX1X1/2 BEV (NEEDLE) ×2 IMPLANT
NS IRRIG 1000ML POUR BTL (IV SOLUTION) ×2 IMPLANT
PACK CV ACCESS (CUSTOM PROCEDURE TRAY) ×2 IMPLANT
PAD ARMBOARD 7.5X6 YLW CONV (MISCELLANEOUS) ×4 IMPLANT
SPONGE T-LAP 18X18 ~~LOC~~+RFID (SPONGE) ×2 IMPLANT
SUT MNCRL AB 4-0 PS2 18 (SUTURE) ×3 IMPLANT
SUT PROLENE 5 0 C 1 24 (SUTURE) ×5 IMPLANT
SUT PROLENE 6 0 BV (SUTURE) ×3 IMPLANT
SUT PROLENE 7 0 BV 1 (SUTURE) IMPLANT
SUT SILK 2 0 SH (SUTURE) IMPLANT
SUT SILK 3 0SH CR/8 30 (SUTURE) ×1 IMPLANT
SUT VIC AB 2-0 CT1 27 (SUTURE) ×2
SUT VIC AB 2-0 CT1 TAPERPNT 27 (SUTURE) ×1 IMPLANT
SUT VIC AB 3-0 SH 27 (SUTURE) ×6
SUT VIC AB 3-0 SH 27X BRD (SUTURE) ×2 IMPLANT
TOWEL GREEN STERILE (TOWEL DISPOSABLE) ×2 IMPLANT
UNDERPAD 30X36 HEAVY ABSORB (UNDERPADS AND DIAPERS) ×2 IMPLANT
WATER STERILE IRR 1000ML POUR (IV SOLUTION) ×2 IMPLANT

## 2021-09-02 NOTE — Discharge Instructions (Signed)
[facility]  Discharge Instructions  AV Fistula or Graft Surgery for Dialysis Access  Please refer to the following instructions for your post-procedure care. Your surgeon or physician assistant will discuss any changes with you.  Activity  You may drive the day following your surgery, if you are comfortable and no longer taking prescription pain medication. Resume full activity as the soreness in your incision resolves.  Bathing/Showering  You may shower after you go home. Keep your incision dry for 48 hours. Do not soak in a bathtub, hot tub, or swim until the incision heals completely. You may not shower if you have a hemodialysis catheter.  Incision Care  Clean your incision with mild soap and water after 48 hours. Pat the area dry with a clean towel. You do not need a bandage unless otherwise instructed. Do not apply any ointments or creams to your incision. You may have skin glue on your incision. Do not peel it off. It will come off on its own in about one week. Your arm may swell a bit after surgery. To reduce swelling use pillows to elevate your arm so it is above your heart. Your doctor will tell you if you need to lightly wrap your arm with an ACE bandage.  Diet  Resume your normal diet. There are not special food restrictions following this procedure. In order to heal from your surgery, it is CRITICAL to get adequate nutrition. Your body requires vitamins, minerals, and protein. Vegetables are the best source of vitamins and minerals. Vegetables also provide the perfect balance of protein. Processed food has little nutritional value, so try to avoid this.  Medications  Resume taking all of your medications. If your incision is causing pain, you may take over-the counter pain relievers such as acetaminophen (Tylenol). If you were prescribed a stronger pain medication, please be aware these medications can cause nausea and constipation. Prevent  nausea by taking the medication with a snack or meal. Avoid constipation by drinking plenty of fluids and eating foods with high amount of fiber, such as fruits, vegetables, and grains.  Do not take Tylenol if you are taking prescription pain medications.  Follow up Your surgeon may want to see you in the office following your access surgery. If so, this will be arranged at the time of your surgery.  Please call us immediately for any of the following conditions:  Increased pain, redness, drainage (pus) from your incision site Fever of 101 degrees or higher Severe or worsening pain at your incision site Hand pain or numbness.  Reduce your risk of vascular disease:  Stop smoking. If you would like help, call [facility] at 1-800-QUIT-NOW 7275705068) or [place] at [place] your cholesterol Maintain a desired weight Control your diabetes Keep your blood pressure down  Dialysis  It will take several weeks to several months for your new dialysis access to be ready for use. Your surgeon will determine when it is okay to use it. Your nephrologist will continue to direct your dialysis. You can continue to use your Permcath until your new access is ready for use.   09/02/2021 Sherry [name] [id number] [birth year]  Surgeon(s): Broadus John, MD  Procedure(s): RIGHT SECOND STAGE BASILIC VEIN TRANSPOSITION   May stick graft immediately   May stick graft on designated area only:   X Do not stick right AV fistula for 6 weeks    If you have any questions, please call the office at  [phone]. °

## 2021-09-02 NOTE — Transfer of Care (Signed)
Immediate Anesthesia Transfer of Care Note  Patient: Siham Bucaro  Procedure(s) Performed: RIGHT SECOND STAGE BASILIC VEIN TRANSPOSITION (Right: Arm Upper)  Patient Location: PACU  Anesthesia Type:MAC combined with regional for post-op pain  Level of Consciousness: awake, alert , oriented and patient cooperative  Airway & Oxygen Therapy: Patient Spontanous Breathing  Post-op Assessment: Report given to RN, Post -op Vital signs reviewed and stable and Patient moving all extremities  Post vital signs: Reviewed and stable  Last Vitals:  Vitals Value Taken Time  BP 148/67 09/02/21 1016  Temp    Pulse 74 09/02/21 1017  Resp 17 09/02/21 1017  SpO2 100 % 09/02/21 1017  Vitals shown include unvalidated device data.  Last Pain:  Vitals:   09/02/21 0612  TempSrc:   PainSc: 0-No pain         Complications: No notable events documented.

## 2021-09-02 NOTE — Anesthesia Procedure Notes (Signed)
Procedure Name: MAC Date/Time: 09/02/2021 7:38 AM Performed by: Claris Che, CRNA Pre-anesthesia Checklist: Patient identified, Emergency Drugs available, Suction available, Patient being monitored and Timeout performed Patient Re-evaluated:Patient Re-evaluated prior to induction Oxygen Delivery Method: Simple face mask Ventilation: Nasal airway inserted- appropriate to patient size

## 2021-09-02 NOTE — H&P (Signed)
HISTORY AND PHYSICAL   Patient seen and examined in preop holding.  No complaints. No changes to medication history or physical exam since last seen in clinic. After discussing the risks and benefits of right arm brachiobasilic fistula superficialization, Sherry [name] elected to proceed.   Sherry John MD   CC:  dialysis access Requesting Provider:  No ref. provider found  HPI: This is a 66 y.o. female here for evaluation of her hemodialysis access.  Pt has hx of right 1st stage BVT on 05/01/2021 for CKD IV by Dr. Virl [name].  She is sent for evaluation of her fistula for when it can be used.  Pt had appt in December that she did not make and comes in today for evaluation.  She states that she has started dialysis since she was last seen by our office.  She is dialyzing M/W/F at the [facility] location.  She does have a right IJ TDC that was placed she thinks by [facility].  She states she does have some aching in her right hand but she is able to use her hand.   Dialysis access history: As above   The pt is right hand dominant.    Pt is on dialysis.   Days of dialysis if applicable:  M/W/F    HD center if applicable:  [place] location.   The pt is not on a statin for cholesterol management.  The pt is not on a daily aspirin.  Other AC:  none The pt is on CCB, BB, hydralazine, diuretic for hypertension.  The pt is not diabetic.   Tobacco hx:  current  Past Medical History:  Diagnosis Date   Alcohol abuse 05/09/2019   Alcohol use disorder, severe, dependence ([place]) 08/27/2017   Alcohol withdrawal ([place]) 08/09/2017   Anxiety    ARF (acute renal failure) ([place]) 04/09/2020   Arthropathy of lumbar facet joint 05/24/2020   Bilateral low back pain with bilateral sciatica 01/05/2019   Bipolar and related disorder ([place])    Blood transfusion without reported diagnosis    Body mass index (BMI) 40.0-44.9, adult ([place]) 07/19/2020   Breast cancer ([place])    right lumpectemy and lymph node     Cancer ([place]) 09/06/2017   Cervical spondylosis 05/24/2020   Chest pain 05/31/2020   Chest pain, rule out acute myocardial infarction 05/29/2020   CHF (congestive heart failure) (HCC)    Chronic back pain    Chronic bilateral low back pain with right-sided sciatica 05/24/2020   Chronic diastolic CHF (congestive heart failure) ([place]) 05/09/2019   Chronic kidney disease    "Stage IV" - states due to hypertension   Chronic kidney disease, stage III (moderate) ([place]) 11/17/2016   Chronic low back pain 09/06/2017   Disc levels:      No abnormality at L2-3 or above.      L3-4:  Mild bulging of the disc.  No stenosis.      L4-5: Bilateral facet arthropathy with gaping, fluid-filled joints.   Joint edema. Anterolisthesis would likely occur at this level with   standing and flexion. Disc degeneration with mild bulging of the   disc. Mild narrowing of the lateral recesses and foramina, which   would likely worsen   Cigarette smoker 12/31/2017   CKD (chronic kidney disease) stage 4, GFR 15-29 ml/min (HCC) 04/18/2020   Clonus 05/24/2020   Cyst of ovary    Depression    Depression with anxiety 08/21/2016   Dizziness 02/24/2018   DOE (dyspnea  on exertion) 12/30/2017   12/30/2017   Walked RA x one lap @ 185 stopped due to  Sob/ lightheaded weak with sats 96% at end - Spirometry 12/30/2017  FEV1 1.27 (68%)  Ratio 99 s curvature / abn effort dep portion only     Dyspnea    Dyspnea and respiratory abnormality 02/13/2013   Formatting of this note might be different from the original. STORY: PSG on 05/20/12 showed no OSA but upper airway resistance syndrome, AHI 3.9, severe snoring was noted. ESS=8. UARS. Advise positional therapy, weight loss program, regular exercise.   ESRD (end stage renal disease) on dialysis ([place]) 08/22/2021   M-W-F   Essential hypertension 07/06/2017   GERD (gastroesophageal reflux disease)    Grief reaction with prolonged bereavement 09/06/2017   Partner of 18 yrs left 2018-pt admits  she still loves her   Hepatitis C    recieved treatment for Hep C   Hepatitis C virus infection cured after antiviral drug therapy 12/17/2012   Telephone Encounter - Dub Mikes, RN - 12/28/2016 9:54 AM EDT Patient called for HCV RNA test results. EOT 08-26-16 - not detected. 12 week post treatment 12-11-16 - not detected. Informed patient that she was cured. Patient verbalized understanding.          History of breast cancer 02/13/2013   Hx of bipolar disorder 01/31/2016   Hypertension    Hypertensive emergency 10/21/2017   Impaired functional mobility, balance, gait, and endurance 01/09/2021   Left-sided weakness    MDD (major depressive disorder), recurrent severe, without psychosis ([place])    Medication intolerance 09/06/2017   Remeron-appetite stimulant/Dizziness/Dysphoria   Microcytic anemia 10/21/2017   Morbid obesity due to excess calories ([place]) 02/13/2013   BMI 42.9   MRSA infection    of breast incision   Nausea 04/09/2020   Formatting of this note might be different from the original. Added automatically from request for surgery [id number]   Near syncope 05/29/2020   Neck pain 05/24/2020   Neuroleptic-induced tardive dyskinesia 09/06/2017   Abilify   Opioid dependence ([place])    Has been treated in [facility] in past   Opioid use disorder, severe, dependence ([place]) 08/27/2017   Oral aphthous ulcer 06/25/2015   Last Assessment & Plan:  Formatting of this note might be different from the original. - Appear to be resolving as a be expected in the case of aphthous stomatitis. - Continue topical therapy with viscous lidocaine.  Recommended trying high-dose anti-inflammatory such as 600 mg of ibuprofen as needed. - She'll return to ENT clinic if symptoms do not resolve.   Phlebitis after infusion 10/28/2017   Rt antecubital fossa   Pre-operative cardiovascular examination 06/13/2018   Restless legs syndrome 02/13/2013   Formatting of this note might be different from the original.  IMPRESSION: Possible. Will follow.   Spondylolisthesis, lumbar region 10/21/2020   Subacute dyskinesia due to drug 02/13/2013   Formatting of this note might be different from the original. STORY: Tardive dyskinesia and mild limb dyskinesia, LE>UE which likely due to prolonged antipsychotic used, abilify. Couldn't tolerate artane, gabapentin, clonazepam and xenazine.`E1o3L`IMPRESSION: Well tolerated depakote 250mg  bid, mood aspect is better as well. Will increase to 500mg  bid. ADR was discussed.  RTC 4-6 weeks.   Substance abuse ([place])    Substance induced mood disorder ([place]) 08/13/2017   TIA (transient ischemic attack) 2020   P/w BP >230/120 and neurologic symptoms, presumed TIA    Past Surgical History:  Procedure Laterality Date   AV FISTULA  PLACEMENT Right 05/01/2021   Procedure: RIGHT ARTERIOVENOUS (AV) FISTULA CREATION;  Surgeon: Sherry John, MD;  Location: [facility] OR;  Service: Vascular;  Laterality: Right;  [place]   BREAST SURGERY Right 2010   "breast cancer survivor" - states partial mastectomy and nodes   COLONOSCOPY     [facility]   ESOPHAGOGASTRODUODENOSCOPY     [facility]   ESOPHAGOGASTRODUODENOSCOPY  04/18/2020   [facility]   LAPAROSCOPIC CHOLECYSTECTOMY  2002   LAPAROSCOPIC INCISIONAL / UMBILICAL / VENTRAL HERNIA REPAIR  04/13/2018   with BARD 15x 32RJ mesh (supraumbilical)   LAPAROSCOPIC LYSIS OF ADHESIONS  07/12/2018   Procedure: LAPAROSCOPIC LYSIS OF ADHESIONS;  Surgeon: Isabel Caprice, MD;  Location: [facility] ORS;  Service: Gynecology;;   MULTIPLE TOOTH EXTRACTIONS     MYOMECTOMY     x 2 prior to hysterectomy   ROBOTIC ASSISTED BILATERAL SALPINGO OOPHERECTOMY Right 07/12/2018   Procedure: XI ROBOTIC ASSISTED RIGHT SALPINGO OOPHORECTOMY;  Surgeon: Isabel Caprice, MD;  Location: [facility] ORS;  Service: Gynecology;  Laterality: Right;   TOTAL ABDOMINAL HYSTERECTOMY     fibroids    UPPER GASTROINTESTINAL ENDOSCOPY      WISDOM TOOTH EXTRACTION      Allergies  Allergen Reactions   Abilify [Aripiprazole] Other (See Comments)    Tardive dyskinesia Oral   Remeron [Mirtazapine] Other (See Comments)    Wgt stimulation /gain, Dizziness, Patient says "can tolerate"   Trazodone And Nefazodone Other (See Comments)    Nightmares/sleep diturbance   Flexeril [Cyclobenzaprine] Other (See Comments)    Pt states Flexeril makes her feel depressed    Amoxicillin Diarrhea and Other (See Comments)    NOTE the patient has had PCN WITHOUT reaction Has patient had a PCN reaction causing immediate rash, facial/tongue/throat swelling, SOB or lightheadedness with hypotension: No Has patient had a PCN reaction causing severe rash involving mucus membranes or skin necrosis: No Has patient had a PCN reaction that required hospitalization: No Has patient had a PCN reaction occurring within the last 10 years: No If all of the above answers are "NO", then may proceed with Cephalosporin use.     Current Facility-Administered Medications  Medication Dose Route Frequency Provider Last Rate Last Admin   0.9 %  sodium chloride infusion   Intravenous Continuous Sherry John, MD       0.9 % irrigation (POUR BTL)    PRN Sherry John, MD   1,000 mL at 09/02/21 0710   chlorhexidine (HIBICLENS) 4 % liquid 4 application  60 mL Topical Once Sherry John, MD       And   [START ON 09/03/2021] chlorhexidine (HIBICLENS) 4 % liquid 4 application  60 mL Topical Once Sherry John, MD       chlorhexidine (PERIDEX) 0.12 % solution 15 mL  15 mL Mouth/Throat Once Santa Lighter, MD       Or   MEDLINE mouth rinse  15 mL Mouth Rinse Once Santa Lighter, MD       heparin 6000 units / NS 500 mL irrigation    PRN Sherry John, MD   1 application at 18/84/16 6063   lactated ringers infusion   Intravenous Continuous Santa Lighter, MD       vancomycin (VANCOCIN) 1-5 GM/200ML-% IVPB            vancomycin (VANCOCIN) IVPB 1000 mg/200 mL  premix  1,000 mg Intravenous 60  min Pre-Op Sherry John, MD       Facility-Administered Medications Ordered in Other Encounters  Medication Dose Route Frequency Provider Last Rate Last Admin   ropivacaine (PF) 5 mg/mL (0.5%) (NAROPIN) injection   Peri-NEURAL Anesthesia Intra-op Santa Lighter, MD   30 mL at 09/02/21 0705    Family History  Problem Relation Age of Onset   Heart attack Mother    Breast cancer Mother 32   Dementia Mother    Cancer Father 33       died of bleeding from kidneys   Colon cancer Neg Hx    Esophageal cancer Neg Hx    Stomach cancer Neg Hx    Rectal cancer Neg Hx     Social History   Socioeconomic History   Marital status: Single    Spouse name: Not on file   Number of children: 0   Years of education: 12   Highest education level: High school graduate  Occupational History   Occupation: Disabled  Tobacco Use   Smoking status: Every Day    Packs/day: 0.50    Types: Cigarettes    Passive exposure: Never   Smokeless tobacco: Never  Vaping Use   Vaping Use: Former   Start date: 06/27/2013   Quit date: 06/14/2017   Devices: Apple Cinnamon-0 mg  Substance and Sexual Activity   Alcohol use: Not Currently    Comment: hx over 30 years   Drug use: Not Currently    Comment: h/o IVD use   Sexual activity: Not Currently  Other Topics Concern   Not on file  Social History Narrative   Lives at home alone.   Right-handed.   Caffeine use: 4 cups coffee/soda   Social Determinants of Health   Financial Resource Strain: Not on file  Food Insecurity: Not on file  Transportation Needs: Not on file  Physical Activity: Not on file  Stress: Not on file  Social Connections: Not on file  Intimate Partner Violence: Not on file     ROS: [x]  Positive   [ ]  Negative   [ ]  All sytems reviewed and are negative  Cardiac: [x]  hx CHF   Vascular: []  pain in legs while walking  Pulmonary: []  asthma []  wheezing  Hematologic/oncology [x]  breast  cancer  Musculoskeletal [x]  chronic back issues  GU: [x]  CKD/renal failure  [x]  HD---[x]  M/W/F []  T/T/S  Psychiatric: [x]  hx of depression  Integumentary: []  rashes []  ulcers  Constitutional: []  fever []  chills   PHYSICAL EXAMINATION:  Today's Vitals   09/01/21 1052 09/02/21 0545 09/02/21 0612 09/02/21 0645  BP:  (!) 219/88  (!) 198/69  Pulse:  75  68  Resp:  17    Temp:  98.5 F (36.9 C)    TempSrc:  Oral    SpO2:  98%    Weight: 91.4 kg 90.3 kg    Height: 5\' 2"  (1.575 m) 5\' 2"  (1.575 m)    PainSc:   0-No pain    Body mass index is 36.4 kg/m.    General:  WDWN female in NAD Gait: Normal HENT: WNL Pulmonary: normal non-labored breathing  Cardiac: regular Skin: without rashes Vascular Exam/Pulses:  Palpable right radial pulse Extremities:  excellent thrill in fistula; right hand is warm and well perfused. Musculoskeletal: no muscle wasting or atrophy  Neurologic: A&O X 3; Speech is fluent/normal  Non-Invasive Vascular Imaging:   Upper Extremity Vein Mapping on 08/22/2021: Findings:  +--------------------+----------+-----------------+--------+   AVF  PSV (cm/s) Flow Vol (mL/min) Comments   +--------------------+----------+-----------------+--------+   Native artery inflow    332           1415                   +--------------------+----------+-----------------+--------+   AVF Anastomosis         642                                  +--------------------+----------+-----------------+--------+      +------------+----------+-------------+----------+-------------------------    OUTFLOW VEIN PSV (cm/s) Diameter (cm) Depth (cm)             +------------+----------+-------------+----------+-------------------------   Prox UA          82         1.23         2.08                              +------------+----------+-------------+----------+-------------------------   Mid UA          426         0.71         1.73    confluence with brachial  vein   +------------+----------+-------------+----------+-------------------------   Dist UA         323         0.66         2.02                              +------------+----------+-------------+----------+-------------------------   AC Fossa        501         0.57         1.65                              +------------+----------+-------------+----------+-------------------------     Summary:  Patent right BVT.  no branching noted.  No significant stenosis noted.  No thrombus noted.    ASSESSMENT/PLAN: 66 y.o. female here for evaluation of her hemodialysis access with hx of right 1st stage BVT on 05/01/2021 for CKD IV by Dr. Virl [name].  She has advanced to ESRD now on HD via TDC right IJ.    -the fistula has matured nicely and is ready for 2nd stage BVT. Discussed with pt the reason she needs a second stage surgery and she expressed understanding. -pt is on dialysis M/W/F at [facility] location via right IJ [facility] that was placed at what sounds like [facility].  Once the fistula is transposed and incisions have healed, the fistula will be able to be accessed.  Discussed with pt that after the fistula is working and center is happy with it, then they can schedule to have her TDC removed with [facility] -she does have some aching in her right hand.  She has an easily palpable right radial pulse.  I discussed with her that I feel like this is most likely due to nerve irritation more than steal syndrome.  -discussed with pt that access does not last forever and will need intervention or even new access at some point.  -will get this scheduled on a day that is convenient for pt and when Dr. Virl [name] has OR time.  She is in agreement.   -  pt is not on anticoagulation   Sherry [name], [facility] [phone]  Clinic MD:   Sherry [name]

## 2021-09-02 NOTE — Op Note (Signed)
° ° °  NAME: Sherry [name]    MRN: [id number] DOB: 28-May-1956    DATE OF OPERATION: 09/02/2021  PREOP DIAGNOSIS:    End-stage renal disease  POSTOP DIAGNOSIS:    Same  PROCEDURE:    Fistula revision Right arm brachiobasilic fistula superficialization  SURGEON: Broadus John  ASSIST: Paulo Fruit  ANESTHESIA: Moderate, block  EBL: 50 mL  INDICATIONS:    Sherry [name] is a 66 y.o. female with end-stage renal disease who underwent first stage brachiobasilic fistula creation several months ago.  She presented to our office after initiating dialysis for superficialization of her fistula.  After discussing the risks and benefits, Sherry [name] elected to proceed.  FINDINGS:   7 mm basilic vein, multiple branches, excellent thrill.  TECHNIQUE:   The patient was brought to the OR laid in supine position.  General anesthesia was induced and the patient was prepped and draped in standard fashion.  A timeout was performed.  Ultrasound was used to insonate the location of the brachiobasilic fistula.  This was marked.  Cautery and sharp dissection were used to expose the basilic vein through 2 skip incisions..  There were multiple branches which required suture ligation.  One branch going to the deep system was 5 mm in size and was oversewn using 5-0 Prolene suture.  There were several overlying nerves, these were not disrupted.  Once all attachments were ligated, I made the decision to sever the fistula in the axilla and tunneled superficially.  A Gore tunneler was brought onto the field and tunneled superficially from the axilla to the distal portion of the bicep.  There was significant bleeding from the tract, therefore a new track was made.  The patient was given 2000 units IV heparin. The anterior surface of the fistula was marked and the brachiobasilic fistula was severed in the axilla.  It was tunneled from the distal bicep back to the axilla for anastomosis.  Anastomosis was sewn using 2, 5-0  Prolene sutures.  Special attention was made not to narrow the anastomosis.  On release of the vascular clamps, there was some pulsatility in the fistula, using blunt dissection, the fistula tract was widened.  This resolved the pulsatility.  There was an excellent thrill within the fistula.  Hemostasis was achieved with the use of cautery and thrombin product.  The patient was closed using layers of Vicryl suture with Monocryl and Dermabond at the skin.   Given the complexity of the case a first assistant was necessary in order to expedient the procedure and safely perform the technical aspects of the operation.  Macie Burows, MD [facility]  DATE OF DICTATION:   09/02/2021

## 2021-09-02 NOTE — Anesthesia Postprocedure Evaluation (Signed)
Anesthesia Post Note  Patient: Sherry [name]  Procedure(s) Performed: RIGHT SECOND STAGE BASILIC VEIN TRANSPOSITION (Right: Arm Upper)     Patient location during evaluation: PACU Anesthesia Type: Regional Level of consciousness: awake and alert Pain management: pain level controlled Vital Signs Assessment: post-procedure vital signs reviewed and stable Respiratory status: spontaneous breathing, nonlabored ventilation, respiratory function stable and patient connected to nasal cannula oxygen Cardiovascular status: stable and blood pressure returned to baseline Postop Assessment: no apparent nausea or vomiting Anesthetic complications: no   No notable events documented.  Last Vitals:  Vitals:   09/02/21 1045 09/02/21 1100  BP: (!) 150/99 (!) 127/56  Pulse: 70 65  Resp: 16 13  Temp:  36.8 C  SpO2: 100% 100%    Last Pain:  Vitals:   09/02/21 1100  TempSrc:   PainSc: 0-No pain                 Santa Lighter

## 2021-09-02 NOTE — Anesthesia Procedure Notes (Signed)
Anesthesia Regional Block: Supraclavicular block   Pre-Anesthetic Checklist: , timeout performed,  Correct Patient, Correct Site, Correct Laterality,  Correct Procedure, Correct Position, site marked,  Risks and benefits discussed,  Surgical consent,  Pre-op evaluation,  At surgeon's request and post-op pain management  Laterality: Right  Prep: chloraprep       Needles:  Injection technique: Single-shot  Needle Type: Echogenic Needle     Needle Length: 9cm  Needle Gauge: 21     Additional Needles:   Procedures:,,,, ultrasound used (permanent image in chart),,    Narrative:  Start time: 09/02/2021 6:58 AM End time: 09/02/2021 7:05 AM Injection made incrementally with aspirations every 5 mL.  Performed by: Personally  Anesthesiologist: Santa Lighter, MD  Additional Notes: No pain on injection. No increased resistance to injection. Injection made in 5cc increments.  Good needle visualization.  Patient tolerated procedure well.

## 2021-09-03 ENCOUNTER — Encounter (HOSPITAL_COMMUNITY): Payer: Self-pay | Admitting: Vascular Surgery

## 2021-09-05 ENCOUNTER — Other Ambulatory Visit: Payer: Self-pay | Admitting: Vascular Surgery

## 2021-09-05 MED ORDER — OXYCODONE-ACETAMINOPHEN 5-325 MG PO TABS: 1.0000 | ORAL_TABLET | ORAL | 0 refills | Status: DC | PRN

## 2021-09-08 ENCOUNTER — Ambulatory Visit: Payer: Medicare Other | Admitting: Surgery

## 2021-09-08 ENCOUNTER — Telehealth: Payer: Self-pay | Admitting: *Deleted

## 2021-09-08 NOTE — Telephone Encounter (Signed)
Patient called c/o pain and swelling at upper right AVF and site is warm to touch. She states she is elevating her arm.  Her pain level is at 8.5 on 0-10 scale.  An appointment was scheduled 09/09/2021.

## 2021-09-08 NOTE — Progress Notes (Signed)
HISTORY AND PHYSICAL     CC:  dialysis access Requesting Provider:  Wynelle Fanny, DO  HPI: This is a 66 y.o. female here for evaluation of her hemodialysis access.  Pt has hx of right 1st stage BVT on 05/01/2021 for CKD IV by Dr. Virl Cagey.  This was since transposed 09/04/21. The patient presents to care for evaluation because of warmth and tenderness of the right arm.  She is dialyzing M/W/F at the [facility] location.  She does have a right IJ TDC that was placed she thinks by [facility].    On my exam today, the patient reports severe pain around the incisions after her transposition.  Prescribed Percocet is not working for her.  She does have chronic pain and takes gabapentin.  She has had multiple narcotic prescriptions in the past per PMP aware.  The patient reports neuropathic type pain in the distribution of the median antecubital brachial nerve.   The pt is right hand dominant.    Pt is on dialysis.   Days of dialysis if applicable:  M/W/F    HD center if applicable:  [place] location.  The pt is not on a statin for cholesterol management.  The pt is not on a daily aspirin.  Other AC:  none The pt is on CCB, BB, hydralazine, diuretic for hypertension.  The pt is not diabetic.   Tobacco hx:  current  Past Medical History:  Diagnosis Date   Alcohol abuse 05/09/2019   Alcohol use disorder, severe, dependence ([place]) 08/27/2017   Alcohol withdrawal ([place]) 08/09/2017   Anxiety    ARF (acute renal failure) ([place]) 04/09/2020   Arthropathy of lumbar facet joint 05/24/2020   Bilateral low back pain with bilateral sciatica 01/05/2019   Bipolar and related disorder ([place])    Blood transfusion without reported diagnosis    Body mass index (BMI) 40.0-44.9, adult ([place]) 07/19/2020   Breast cancer ([place])    right lumpectemy and lymph node    Cancer ([place]) 09/06/2017   Cervical spondylosis 05/24/2020   Chest pain 05/31/2020   Chest pain, rule out acute myocardial infarction 05/29/2020    CHF (congestive heart failure) (HCC)    Chronic back pain    Chronic bilateral low back pain with right-sided sciatica 05/24/2020   Chronic diastolic CHF (congestive heart failure) ([place]) 05/09/2019   Chronic kidney disease    "Stage IV" - states due to hypertension   Chronic kidney disease, stage III (moderate) ([place]) 11/17/2016   Chronic low back pain 09/06/2017   Disc levels:      No abnormality at L2-3 or above.      L3-4:  Mild bulging of the disc.  No stenosis.      L4-5: Bilateral facet arthropathy with gaping, fluid-filled joints.   Joint edema. Anterolisthesis would likely occur at this level with   standing and flexion. Disc degeneration with mild bulging of the   disc. Mild narrowing of the lateral recesses and foramina, which   would likely worsen   Cigarette smoker 12/31/2017   CKD (chronic kidney disease) stage 4, GFR 15-29 ml/min (HCC) 04/18/2020   Clonus 05/24/2020   Cyst of ovary    Depression    Depression with anxiety 08/21/2016   Dizziness 02/24/2018   DOE (dyspnea on exertion) 12/30/2017   12/30/2017   Walked RA x one lap @ 185 stopped due to  Sob/ lightheaded weak with sats 96% at end - Spirometry 12/30/2017  FEV1 1.27 (68%)  Ratio  99 s curvature / abn effort dep portion only     Dyspnea    Dyspnea and respiratory abnormality 02/13/2013   Formatting of this note might be different from the original. STORY: PSG on 05/20/12 showed no OSA but upper airway resistance syndrome, AHI 3.9, severe snoring was noted. ESS=8. UARS. Advise positional therapy, weight loss program, regular exercise.   ESRD (end stage renal disease) on dialysis ([place]) 08/22/2021   M-W-F   Essential hypertension 07/06/2017   GERD (gastroesophageal reflux disease)    Grief reaction with prolonged bereavement 09/06/2017   Partner of 18 yrs left 2018-pt admits she still loves her   Hepatitis C    recieved treatment for Hep C   Hepatitis C virus infection cured after antiviral drug therapy 12/17/2012    Telephone Encounter - Dub Mikes, RN - 12/28/2016 9:54 AM EDT Patient called for HCV RNA test results. EOT 08-26-16 - not detected. 12 week post treatment 12-11-16 - not detected. Informed patient that she was cured. Patient verbalized understanding.          History of breast cancer 02/13/2013   Hx of bipolar disorder 01/31/2016   Hypertension    Hypertensive emergency 10/21/2017   Impaired functional mobility, balance, gait, and endurance 01/09/2021   Left-sided weakness    MDD (major depressive disorder), recurrent severe, without psychosis ([place])    Medication intolerance 09/06/2017   Remeron-appetite stimulant/Dizziness/Dysphoria   Microcytic anemia 10/21/2017   Morbid obesity due to excess calories ([place]) 02/13/2013   BMI 42.9   MRSA infection    of breast incision   Nausea 04/09/2020   Formatting of this note might be different from the original. Added automatically from request for surgery [id number]   Near syncope 05/29/2020   Neck pain 05/24/2020   Neuroleptic-induced tardive dyskinesia 09/06/2017   Abilify   Opioid dependence ([place])    Has been treated in [facility] in past   Opioid use disorder, severe, dependence ([place]) 08/27/2017   Oral aphthous ulcer 06/25/2015   Last Assessment & Plan:  Formatting of this note might be different from the original. - Appear to be resolving as a be expected in the case of aphthous stomatitis. - Continue topical therapy with viscous lidocaine.  Recommended trying high-dose anti-inflammatory such as 600 mg of ibuprofen as needed. - She'll return to ENT clinic if symptoms do not resolve.   Phlebitis after infusion 10/28/2017   Rt antecubital fossa   Pre-operative cardiovascular examination 06/13/2018   Restless legs syndrome 02/13/2013   Formatting of this note might be different from the original. IMPRESSION: Possible. Will follow.   Spondylolisthesis, lumbar region 10/21/2020   Subacute dyskinesia due to drug 02/13/2013   Formatting of this  note might be different from the original. STORY: Tardive dyskinesia and mild limb dyskinesia, LE>UE which likely due to prolonged antipsychotic used, abilify. Couldn't tolerate artane, gabapentin, clonazepam and xenazine.`E1o3L`IMPRESSION: Well tolerated depakote 250mg  bid, mood aspect is better as well. Will increase to 500mg  bid. ADR was discussed.  RTC 4-6 weeks.   Substance abuse ([place])    Substance induced mood disorder ([place]) 08/13/2017   TIA (transient ischemic attack) 2020   P/w BP >230/120 and neurologic symptoms, presumed TIA    Past Surgical History:  Procedure Laterality Date   AV FISTULA PLACEMENT Right 05/01/2021   Procedure: RIGHT ARTERIOVENOUS (AV) FISTULA CREATION;  Surgeon: Broadus John, MD;  Location: [facility] OR;  Service: Vascular;  Laterality: Right;  PERIPHERAL NERVE BLOCK   BACK SURGERY  5176   BASCILIC VEIN TRANSPOSITION Right 09/02/2021   Procedure: RIGHT SECOND STAGE BASILIC VEIN TRANSPOSITION;  Surgeon: Broadus John, MD;  Location: [place];  Service: Vascular;  Laterality: Right;  PERIPHERAL NERVE BLOCK   BREAST SURGERY Right 2010   "breast cancer survivor" - states partial mastectomy and nodes   COLONOSCOPY     [facility]   ESOPHAGOGASTRODUODENOSCOPY     [facility]   ESOPHAGOGASTRODUODENOSCOPY  04/18/2020   [place] [facility]   LAPAROSCOPIC CHOLECYSTECTOMY  2002   LAPAROSCOPIC INCISIONAL / UMBILICAL / VENTRAL HERNIA REPAIR  04/13/2018   with BARD 15x 16WV mesh (supraumbilical)   LAPAROSCOPIC LYSIS OF ADHESIONS  07/12/2018   Procedure: LAPAROSCOPIC LYSIS OF ADHESIONS;  Surgeon: Isabel Caprice, MD;  Location: [facility] ORS;  Service: Gynecology;;   MULTIPLE TOOTH EXTRACTIONS     MYOMECTOMY     x 2 prior to hysterectomy   ROBOTIC ASSISTED BILATERAL SALPINGO OOPHERECTOMY Right 07/12/2018   Procedure: XI ROBOTIC ASSISTED RIGHT SALPINGO OOPHORECTOMY;  Surgeon: Isabel Caprice, MD;  Location: [facility] ORS;  Service: Gynecology;  Laterality: Right;   TOTAL ABDOMINAL  HYSTERECTOMY     fibroids    UPPER GASTROINTESTINAL ENDOSCOPY     WISDOM TOOTH EXTRACTION      Allergies  Allergen Reactions   Abilify [Aripiprazole] Other (See Comments)    Tardive dyskinesia Oral   Remeron [Mirtazapine] Other (See Comments)    Wgt stimulation /gain, Dizziness, Patient says "can tolerate"   Trazodone And Nefazodone Other (See Comments)    Nightmares/sleep diturbance   Flexeril [Cyclobenzaprine] Other (See Comments)    Pt states Flexeril makes her feel depressed    Amoxicillin Diarrhea and Other (See Comments)    NOTE the patient has had PCN WITHOUT reaction Has patient had a PCN reaction causing immediate rash, facial/tongue/throat swelling, SOB or lightheadedness with hypotension: No Has patient had a PCN reaction causing severe rash involving mucus membranes or skin necrosis: No Has patient had a PCN reaction that required hospitalization: No Has patient had a PCN reaction occurring within the last 10 years: No If all of the above answers are "NO", then may proceed with Cephalosporin use.     Current Outpatient Medications  Medication Sig Dispense Refill   albuterol (VENTOLIN HFA) 108 (90 Base) MCG/ACT inhaler Inhale 2 puffs into the lungs every 4 (four) hours as needed for wheezing or shortness of breath.     amLODipine (NORVASC) 10 MG tablet Take 10 mg by mouth in the morning.     benzonatate (TESSALON) 100 MG capsule Take 1 capsule (100 mg total) by mouth every 8 (eight) hours. 21 capsule 0   chlordiazePOXIDE (LIBRIUM) 25 MG capsule 50mg  PO TID x 1D, then 25-50mg  PO BID X 1D, then 25-50mg  PO QD X 1D 10 capsule 0   Cholecalciferol (VITAMIN D3) 50 MCG (2000 UT) capsule Take 1 capsule (2,000 Units total) by mouth daily. 30 capsule 0   docusate sodium (COLACE) 100 MG capsule Take 100 mg by mouth daily as needed for mild constipation.     ferrous sulfate 325 (65 FE) MG tablet Take 325 mg by mouth in the morning and at bedtime.     FLUoxetine (PROZAC) 40 MG capsule  Take 40 mg by mouth in the morning.     furosemide (LASIX) 40 MG tablet Take 40 mg by mouth daily.     gabapentin (NEURONTIN) 300 MG capsule Take 300 mg by mouth 3 (three) times daily.  hydrALAZINE (APRESOLINE) 50 MG tablet Take 100 mg by mouth 3 (three) times daily.     INGREZZA 80 MG CAPS Take 80 mg by mouth at bedtime.     isosorbide mononitrate (IMDUR) 60 MG 24 hr tablet Take 60 mg by mouth in the morning.     labetalol (NORMODYNE) 300 MG tablet Take 300 mg by mouth 2 (two) times daily.     lidocaine (LIDODERM) 5 % Place 1 patch onto the skin daily. Remove & Discard patch within 12 hours or as directed by MD 30 patch 0   Melatonin 10 MG TABS Take 10 mg by mouth daily as needed (sleep).     ondansetron (ZOFRAN-ODT) 4 MG disintegrating tablet Take 4 mg by mouth every 8 (eight) hours as needed for vomiting or nausea.     oxyCODONE-acetaminophen (PERCOCET) 7.5-325 MG tablet Take 1 tablet by mouth every 4 (four) hours as needed for severe pain. 30 tablet 0   pantoprazole (PROTONIX) 40 MG tablet Take 40 mg by mouth daily.     predniSONE (DELTASONE) 20 MG tablet Take 20 mg by mouth See admin instructions. Qd x  5 days     sodium bicarbonate 650 MG tablet Take 1,300 mg by mouth 2 (two) times daily.     No current facility-administered medications for this visit.    Family History  Problem Relation Age of Onset   Heart attack Mother    Breast cancer Mother 81   Dementia Mother    Cancer Father 84       died of bleeding from kidneys   Colon cancer Neg Hx    Esophageal cancer Neg Hx    Stomach cancer Neg Hx    Rectal cancer Neg Hx     Social History   Socioeconomic History   Marital status: Single    Spouse name: Not on file   Number of children: 0   Years of education: 12   Highest education level: High school graduate  Occupational History   Occupation: Disabled  Tobacco Use   Smoking status: Every Day    Packs/day: 0.50    Types: Cigarettes    Passive exposure: Never    Smokeless tobacco: Never  Vaping Use   Vaping Use: Former   Start date: 06/27/2013   Quit date: 06/14/2017   Devices: Apple Cinnamon-0 mg  Substance and Sexual Activity   Alcohol use: Not Currently    Comment: hx over 30 years   Drug use: Not Currently    Comment: h/o IVD use   Sexual activity: Not Currently  Other Topics Concern   Not on file  Social History Narrative   Lives at home alone.   Right-handed.   Caffeine use: 4 cups coffee/soda   Social Determinants of Health   Financial Resource Strain: Not on file  Food Insecurity: Not on file  Transportation Needs: Not on file  Physical Activity: Not on file  Stress: Not on file  Social Connections: Not on file  Intimate Partner Violence: Not on file     ROS: [x]  Positive   [ ]  Negative   [ ]  All sytems reviewed and are negative  Cardiac: [x]  hx CHF   Vascular: []  pain in legs while walking  Pulmonary: []  asthma []  wheezing  Hematologic/oncology [x]  breast cancer  Musculoskeletal [x]  chronic back issues  GU: [x]  CKD/renal failure  [x]  HD---[x]  M/W/F []  T/T/S  Psychiatric: [x]  hx of depression  Integumentary: []  rashes []  ulcers  Constitutional: []  fever []   chills   PHYSICAL EXAMINATION:  Today's Vitals   09/09/21 0924  BP: (!) 142/76  Pulse: 75  Resp: 20  Temp: 98.4 F (36.9 C)  TempSrc: Temporal  SpO2: 100%  Weight: 193 lb 8 oz (87.8 kg)  Height: 5\' 2"  (1.575 m)  PainSc: 7   PainLoc: Arm    Body mass index is 35.39 kg/m.    General:  WDWN female in NAD Gait: Normal HENT: WNL Pulmonary: normal non-labored breathing  Cardiac: regular Skin: without rashes Vascular Exam/Pulses:  Palpable right radial pulse Extremities:  excellent thrill in fistula; right hand is warm and well perfused.  No evidence of hematoma in the upper arm.  Mild postoperative edema mostly in the upper arm. Musculoskeletal: no muscle wasting or atrophy  Neurologic: A&O X 3; Speech is  fluent/normal  Non-Invasive Vascular Imaging:   None recent  ASSESSMENT/PLAN: 66 y.o. female status post second stage basilic vein transposition 09/02/21 by Dr. Virl Cagey.  The fistula feels appropriate under the skin with a smooth thrill.  I see no evidence of hematoma on exam today.  She has discomfort in the expected distribution of the median antecubital brachial nerve.  I suspect this may have been irritated by surgery.  I counseled her that this will take some time to get better.  Encouraged her to do gentle compression.  I will refill her narcotic one-time but will not refill this again.  She can follow-up with Dr. Virl Cagey in the next few weeks to monitor her progress.  She may benefit from pain management.  Yevonne Aline. Stanford Breed, MD [facility] Phone Number: [phone] 09/09/2021 10:42 AM

## 2021-09-09 ENCOUNTER — Ambulatory Visit (INDEPENDENT_AMBULATORY_CARE_PROVIDER_SITE_OTHER): Payer: Medicare Other | Admitting: Vascular Surgery

## 2021-09-09 ENCOUNTER — Other Ambulatory Visit: Payer: Self-pay

## 2021-09-09 ENCOUNTER — Encounter: Payer: Self-pay | Admitting: Vascular Surgery

## 2021-09-09 VITALS — BP 142/76 | HR 75 | Temp 98.4°F | Resp 20 | Ht 62.0 in | Wt 193.5 lb

## 2021-09-09 DIAGNOSIS — Z992 Dependence on renal dialysis: Secondary | ICD-10-CM

## 2021-09-09 DIAGNOSIS — G8918 Other acute postprocedural pain: Secondary | ICD-10-CM

## 2021-09-09 DIAGNOSIS — N186 End stage renal disease: Secondary | ICD-10-CM

## 2021-09-09 MED ORDER — OXYCODONE-ACETAMINOPHEN 7.5-325 MG PO TABS: 1.0000 | ORAL_TABLET | ORAL | 0 refills | Status: DC | PRN

## 2021-09-11 ENCOUNTER — Other Ambulatory Visit: Payer: Self-pay

## 2021-09-11 ENCOUNTER — Emergency Department (HOSPITAL_BASED_OUTPATIENT_CLINIC_OR_DEPARTMENT_OTHER)
Admission: EM | Admit: 2021-09-11 | Discharge: 2021-09-11 | Disposition: A | Discharge status: Home / Self Care | Payer: Medicare Other | Attending: Emergency Medicine | Admitting: Emergency Medicine

## 2021-09-11 ENCOUNTER — Encounter (HOSPITAL_BASED_OUTPATIENT_CLINIC_OR_DEPARTMENT_OTHER): Payer: Self-pay | Admitting: *Deleted

## 2021-09-11 ENCOUNTER — Emergency Department (HOSPITAL_BASED_OUTPATIENT_CLINIC_OR_DEPARTMENT_OTHER): Payer: Medicare Other

## 2021-09-11 ENCOUNTER — Encounter (HOSPITAL_COMMUNITY): Payer: Self-pay

## 2021-09-11 DIAGNOSIS — Z992 Dependence on renal dialysis: Secondary | ICD-10-CM | POA: Insufficient documentation

## 2021-09-11 DIAGNOSIS — N186 End stage renal disease: Secondary | ICD-10-CM | POA: Insufficient documentation

## 2021-09-11 DIAGNOSIS — N183 Chronic kidney disease, stage 3 unspecified: Secondary | ICD-10-CM

## 2021-09-11 DIAGNOSIS — D631 Anemia in chronic kidney disease: Secondary | ICD-10-CM

## 2021-09-11 DIAGNOSIS — Z79899 Other long term (current) drug therapy: Secondary | ICD-10-CM | POA: Diagnosis not present

## 2021-09-11 LAB — CBC WITH DIFFERENTIAL/PLATELET
Abs Immature Granulocytes: 0.02 10*3/uL (ref 0.00–0.07)
Basophils Absolute: 0 10*3/uL (ref 0.0–0.1)
Basophils Relative: 1 %
Eosinophils Absolute: 0.2 10*3/uL (ref 0.0–0.5)
Eosinophils Relative: 3 %
HCT: 25.5 % — ABNORMAL LOW (ref 36.0–46.0)
Hemoglobin: 8 g/dL — ABNORMAL LOW (ref 12.0–15.0)
Immature Granulocytes: 0 %
Lymphocytes Relative: 31 %
Lymphs Abs: 2 10*3/uL (ref 0.7–4.0)
MCH: 28.9 pg (ref 26.0–34.0)
MCHC: 31.4 g/dL (ref 30.0–36.0)
MCV: 92.1 fL (ref 80.0–100.0)
Monocytes Absolute: 0.4 10*3/uL (ref 0.1–1.0)
Monocytes Relative: 7 %
Neutro Abs: 3.7 10*3/uL (ref 1.7–7.7)
Neutrophils Relative %: 58 %
Platelets: 237 10*3/uL (ref 150–400)
RBC: 2.77 MIL/uL — ABNORMAL LOW (ref 3.87–5.11)
RDW: 16.3 % — ABNORMAL HIGH (ref 11.5–15.5)
WBC: 6.3 10*3/uL (ref 4.0–10.5)
nRBC: 0 % (ref 0.0–0.2)

## 2021-09-11 LAB — BASIC METABOLIC PANEL
Anion gap: 12 (ref 5–15)
BUN: 37 mg/dL — ABNORMAL HIGH (ref 8–23)
CO2: 28 mmol/L (ref 22–32)
Calcium: 8.6 mg/dL — ABNORMAL LOW (ref 8.9–10.3)
Chloride: 99 mmol/L (ref 98–111)
Creatinine, Ser: 4.17 mg/dL — ABNORMAL HIGH (ref 0.44–1.00)
GFR, Estimated: 11 mL/min — ABNORMAL LOW (ref >60)
Glucose, Bld: 101 mg/dL — ABNORMAL HIGH (ref 70–99)
Potassium: 3.9 mmol/L (ref 3.5–5.1)
Sodium: 139 mmol/L (ref 135–145)

## 2021-09-11 NOTE — ED Triage Notes (Signed)
She was on the dialysis machine when she felt like the "inside of her flesh was burning". They were unable to complete her dialysis due to complication with the machine.  ?

## 2021-09-11 NOTE — ED Provider Notes (Addendum)
[facility] EMERGENCY DEPARTMENT Provider Note   CSN: [id number] Arrival date & time: 09/11/21  1131     History  No chief complaint on file.   Sherry [name] is a 66 y.o. female.  Patient is dialysis patient patient normally dialyzed Monday Wednesdays and Fridays.  Patient states that they could not complete her dialysis yesterday said her blood pressure got low.  Today patient talks about have a burning feeling inside and feeling short of breath.  Patient is just been receiving dialysis for 2 months.  She has a catheter in her right anterior chest area.  Patient is vital signs are reassuring temp was 98.5.  Respiration 20 heart rate 72 blood pressure 108/82 oxygen saturations were 95%.      Home Medications Prior to Admission medications   Medication Sig Start Date End Date Taking? Authorizing Provider  albuterol (VENTOLIN HFA) 108 (90 Base) MCG/ACT inhaler Inhale 2 puffs into the lungs every 4 (four) hours as needed for wheezing or shortness of breath. 04/23/21   [provider]  amLODipine (NORVASC) 10 MG tablet Take 10 mg by mouth in the morning.    [provider]  benzonatate (TESSALON) 100 MG capsule Take 1 capsule (100 mg total) by mouth every 8 (eight) hours. 06/23/21   Mannie Stabile, PA-C  chlordiazePOXIDE (LIBRIUM) 25 MG capsule 50mg  PO TID x 1D, then 25-50mg  PO BID X 1D, then 25-50mg  PO QD X 1D 05/27/21   Melene Plan, DO  Cholecalciferol (VITAMIN D3) 50 MCG (2000 UT) capsule Take 1 capsule (2,000 Units total) by mouth daily. 09/26/20   Shade Flood, MD  docusate sodium (COLACE) 100 MG capsule Take 100 mg by mouth daily as needed for mild constipation.    [provider]  ferrous sulfate 325 (65 FE) MG tablet Take 325 mg by mouth in the morning and at bedtime.    [provider]  FLUoxetine (PROZAC) 40 MG capsule Take 40 mg by mouth in the morning. 11/09/18   [provider]  furosemide (LASIX) 40 MG tablet Take 40  mg by mouth daily. 04/30/21   [provider]  gabapentin (NEURONTIN) 300 MG capsule Take 300 mg by mouth 3 (three) times daily. 09/04/21   [provider]  hydrALAZINE (APRESOLINE) 50 MG tablet Take 100 mg by mouth 3 (three) times daily. 03/24/21   [provider]  INGREZZA 80 MG CAPS Take 80 mg by mouth at bedtime. 10/15/17   [provider]  isosorbide mononitrate (IMDUR) 60 MG 24 hr tablet Take 60 mg by mouth in the morning.    [provider]  labetalol (NORMODYNE) 300 MG tablet Take 300 mg by mouth 2 (two) times daily.    [provider]  lidocaine (LIDODERM) 5 % Place 1 patch onto the skin daily. Remove & Discard patch within 12 hours or as directed by MD 09/01/21   Roxy Horseman, PA-C  Melatonin 10 MG TABS Take 10 mg by mouth daily as needed (sleep).    [provider]  ondansetron (ZOFRAN-ODT) 4 MG disintegrating tablet Take 4 mg by mouth every 8 (eight) hours as needed for vomiting or nausea. 03/26/21   [provider]  oxyCODONE-acetaminophen (PERCOCET) 7.5-325 MG tablet Take 1 tablet by mouth every 4 (four) hours as needed for severe pain. 09/09/21   Leonie Douglas, MD  pantoprazole (PROTONIX) 40 MG tablet Take 40 mg by mouth daily. 04/05/21   [provider]  predniSONE (DELTASONE) 20 MG  tablet Take 20 mg by mouth See admin instructions. Qd x  5 days 08/24/21   [provider]  sodium bicarbonate 650 MG tablet Take 1,300 mg by mouth 2 (two) times daily. 04/30/21   [provider]      Allergies    Abilify [aripiprazole], Remeron [mirtazapine], Trazodone and nefazodone, Flexeril [cyclobenzaprine], and Amoxicillin    Review of Systems   Review of Systems  Constitutional:  Negative for chills and fever.  HENT:  Negative for ear pain and sore throat.   Eyes:  Negative for pain and visual disturbance.  Respiratory:  Positive for shortness of breath. Negative for cough.   Cardiovascular:   Negative for chest pain and palpitations.  Gastrointestinal:  Negative for abdominal pain and vomiting.  Genitourinary:  Negative for dysuria and hematuria.  Musculoskeletal:  Negative for arthralgias and back pain.  Skin:  Negative for color change and rash.  Neurological:  Negative for seizures and syncope.  All other systems reviewed and are negative.  Physical Exam Updated Vital Signs BP (!) 144/49 (BP Location: Left Wrist)   Pulse 75   Temp 98.5 F (36.9 C) (Oral)   Resp 18   Ht 1.575 m (5\' 2" )   Wt 87.8 kg   SpO2 92%   BMI 35.40 kg/m  Physical Exam Vitals and nursing note reviewed.  Constitutional:      General: She is not in acute distress.    Appearance: Normal appearance. She is well-developed.  HENT:     Head: Normocephalic and atraumatic.  Eyes:     Extraocular Movements: Extraocular movements intact.     Conjunctiva/sclera: Conjunctivae normal.     Pupils: Pupils are equal, round, and reactive to light.  Cardiovascular:     Rate and Rhythm: Normal rate and regular rhythm.     Heart sounds: No murmur heard. Pulmonary:     Effort: Pulmonary effort is normal. No respiratory distress.     Breath sounds: Normal breath sounds. No wheezing, rhonchi or rales.     Comments: Dialysis catheter right anterior chest Abdominal:     Palpations: Abdomen is soft.     Tenderness: There is no abdominal tenderness.  Musculoskeletal:        General: No swelling.     Cervical back: Normal range of motion and neck supple.  Skin:    General: Skin is warm and dry.     Capillary Refill: Capillary refill takes less than 2 seconds.  Neurological:     General: No focal deficit present.     Mental Status: She is alert and oriented to person, place, and time.  Psychiatric:        Mood and Affect: Mood normal.    ED Results / Procedures / Treatments   Labs (all labs ordered are listed, but only abnormal results are displayed) Labs Reviewed  CBC WITH DIFFERENTIAL/PLATELET  BASIC  METABOLIC PANEL    EKG EKG Interpretation  Date/Time:  Thursday September 11 2021 14:08:11 EST Ventricular Rate:  75 PR Interval:  150 QRS Duration: 102 QT Interval:  449 QTC Calculation: 502 R Axis:   66 Text Interpretation: Sinus rhythm Probable left atrial enlargement Left ventricular hypertrophy Prolonged QT interval Confirmed by Vanetta Mulders [phone]) on 09/11/2021 2:16:28 PM  Radiology No results found.  Procedures Procedures    Medications Ordered in ED Medications - No data to display  ED Course/ Medical Decision Making/ A&P  Medical Decision Making Amount and/or Complexity of Data Reviewed Labs: ordered. Radiology: ordered.    Patient's EKG without significant changes from previous in February.  However does have prolonged QT this time.  Patient's vital signs are very reassuring no fever.  No hypoxia.  We will get chest x-ray CBC basic metabolic panel and continue on the heart monitor.  Patient would normally be rescheduled for dialysis tomorrow.  Clarified with patient that the complaint is if there is a throbbing with all her muscles throughout her whole body.  Includes legs arms and trunk.  No isolated or specific chest pain.  Again patient's vital signs here are very reassuring oxygen saturations in the upper 90s on room air.  Patient does states she feels as if she cannot breathe.  No respiratory symptoms otherwise no cough no congestion.  No fevers here.  Patient's labs significant for hemoglobin of 8.  Normal white blood cell count.  At 6.3.  Electrolytes very reassuring potassium 3.9.  Chest x-ray negative for any acute cardiopulmonary abnormalities.  No evidence of any fluid overload.  Not able to explain patient's symptoms but nothing seems to be completely out of the ordinary.  Feel that patient can follow-up with primary care doctor and is safe for resuming dialysis tomorrow.  Will need follow-up of her anemia.  But they  normally follow this carefully when they are on dialysis.    Final Clinical Impression(s) / ED Diagnoses Final diagnoses:  ESRD on dialysis [facility])    Rx / DC Orders ED Discharge Orders     None         Vanetta Mulders, MD 09/11/21 1434    Vanetta Mulders, MD 09/11/21 1526

## 2021-09-11 NOTE — Discharge Instructions (Addendum)
Follow-up with your doctors.  Follow-up for dialysis tomorrow as scheduled.  No significant explanation for your symptoms here today.  Labs showed anemia that will need to be followed carefully but normally the dialysis folks cannot follow that.  No significant electrolyte abnormalities.  Chest x-ray showed no fluid on the lungs. ?

## 2021-09-12 ENCOUNTER — Other Ambulatory Visit: Payer: Self-pay | Admitting: *Deleted

## 2021-09-12 DIAGNOSIS — N186 End stage renal disease: Secondary | ICD-10-CM

## 2021-10-13 ENCOUNTER — Other Ambulatory Visit: Payer: Self-pay

## 2021-10-13 DIAGNOSIS — Z992 Dependence on renal dialysis: Secondary | ICD-10-CM

## 2021-10-23 NOTE — Progress Notes (Signed)
? ?HISTORY AND PHYSICAL  ? ? ?HPI: This is a 66 y.o. female presenting in follow-up status post right brachiobasilic fistula transposition.  First stage was performed on 05/01/2021 followed by transposition on 09/04/2021.  She was seen in our office several weeks later and noted to have pain at her incision site, specifically in the median antecubital nerve distribution.  She is dialyzing M/W/F at the [facility] location.  She does have a right IJ TDC that was placed she thinks by [facility].   ? ?On exam today, Cyndy was doing well.  She notes mild tenderness at the right arm transposition site, but says this is getting better.  She denies symptoms associated with steal syndrome. ? ?The pt is right hand dominant.    ?Pt is on dialysis.   ?Days of dialysis if applicable:  M/W/F    ?HD center if applicable:  [place] location. ? ?The pt is not on a statin for cholesterol management.  ?The pt is not on a daily aspirin.  Other AC:  none ?The pt is on CCB, BB, hydralazine, diuretic for hypertension.  ?The pt is not diabetic.   ?Tobacco hx:  current ? ?Past Medical History:  ?Diagnosis Date  ? Alcohol abuse 05/09/2019  ? Alcohol use disorder, severe, dependence ([place]) 08/27/2017  ? Alcohol withdrawal ([place]) 08/09/2017  ? Anxiety   ? ARF (acute renal failure) ([place]) 04/09/2020  ? Arthropathy of lumbar facet joint 05/24/2020  ? Bilateral low back pain with bilateral sciatica 01/05/2019  ? Bipolar and related disorder ([place])   ? Blood transfusion without reported diagnosis   ? Body mass index (BMI) 40.0-44.9, adult ([place]) 07/19/2020  ? Breast cancer ([place])   ? right lumpectemy and lymph node   ? Cancer ([place]) 09/06/2017  ? Cervical spondylosis 05/24/2020  ? Chest pain 05/31/2020  ? Chest pain, rule out acute myocardial infarction 05/29/2020  ? CHF (congestive heart failure) ([place])   ? Chronic back pain   ? Chronic bilateral low back pain with right-sided sciatica 05/24/2020  ? Chronic diastolic CHF (congestive heart failure) ([place])  05/09/2019  ? Chronic kidney disease   ? "Stage IV" - states due to hypertension  ? Chronic kidney disease, stage III (moderate) ([place]) 11/17/2016  ? Chronic low back pain 09/06/2017  ? Disc levels:      No abnormality at L2-3 or above.      L3-4:  Mild bulging of the disc.  No stenosis.      L4-5: Bilateral facet arthropathy with gaping, fluid-filled joints.   Joint edema. Anterolisthesis would likely occur at this level with   standing and flexion. Disc degeneration with mild bulging of the   disc. Mild narrowing of the lateral recesses and foramina, which   would likely worsen  ? Cigarette smoker 12/31/2017  ? CKD (chronic kidney disease) stage 4, GFR 15-29 ml/min ([facility]) 04/18/2020  ? Clonus 05/24/2020  ? Cyst of ovary   ? Depression   ? Depression with anxiety 08/21/2016  ? Dizziness 02/24/2018  ? DOE (dyspnea on exertion) 12/30/2017  ? 12/30/2017   Walked RA x one lap @ 185 stopped due to  Sob/ lightheaded weak with sats 96% at end - Spirometry 12/30/2017  FEV1 1.27 (68%)  Ratio 99 s curvature / abn effort dep portion only    ? Dyspnea   ? Dyspnea and respiratory abnormality 02/13/2013  ? Formatting of this note might be different from the original. STORY: PSG on 05/20/12 showed no OSA  but upper airway resistance syndrome, AHI 3.9, severe snoring was noted. ESS=8. UARS. Advise positional therapy, weight loss program, regular exercise.  ? ESRD (end stage renal disease) on dialysis ([place]) 08/22/2021  ? M-W-F  ? Essential hypertension 07/06/2017  ? GERD (gastroesophageal reflux disease)   ? Grief reaction with prolonged bereavement 09/06/2017  ? Partner of 18 yrs left 2018-pt admits she still loves her  ? Hepatitis C   ? recieved treatment for Hep C  ? Hepatitis C virus infection cured after antiviral drug therapy 12/17/2012  ? Telephone Encounter - Dub Mikes, RN - 12/28/2016 9:54 AM EDT Patient called for HCV RNA test results. EOT 08-26-16 - not detected. 12 week post treatment 12-11-16 - not detected. Informed patient  that she was cured. Patient verbalized understanding.         ? History of breast cancer 02/13/2013  ? Hx of bipolar disorder 01/31/2016  ? Hypertension   ? Hypertensive emergency 10/21/2017  ? Impaired functional mobility, balance, gait, and endurance 01/09/2021  ? Left-sided weakness   ? MDD (major depressive disorder), recurrent severe, without psychosis ([employer])   ? Medication intolerance 09/06/2017  ? Remeron-appetite stimulant/Dizziness/Dysphoria  ? Microcytic anemia 10/21/2017  ? Morbid obesity due to excess calories ([place]) 02/13/2013  ? BMI 42.9  ? MRSA infection   ? of breast incision  ? Nausea 04/09/2020  ? Formatting of this note might be different from the original. Added automatically from request for surgery [phone]  ? Near syncope 05/29/2020  ? Neck pain 05/24/2020  ? Neuroleptic-induced tardive dyskinesia 09/06/2017  ? Abilify  ? Opioid dependence ([place])   ? Has been treated in [facility] in past  ? Opioid use disorder, severe, dependence ([place]) 08/27/2017  ? Oral aphthous ulcer 06/25/2015  ? Last Assessment & Plan:  Formatting of this note might be different from the original. - Appear to be resolving as a be expected in the case of aphthous stomatitis. - Continue topical therapy with viscous lidocaine.  Recommended trying high-dose anti-inflammatory such as 600 mg of ibuprofen as needed. - She'll return to ENT clinic if symptoms do not resolve.  ? Phlebitis after infusion 10/28/2017  ? Rt antecubital fossa  ? Pre-operative cardiovascular examination 06/13/2018  ? Restless legs syndrome 02/13/2013  ? Formatting of this note might be different from the original. IMPRESSION: Possible. Will follow.  ? Spondylolisthesis, lumbar region 10/21/2020  ? Subacute dyskinesia due to drug 02/13/2013  ? Formatting of this note might be different from the original. STORY: Tardive dyskinesia and mild limb dyskinesia, LE>UE which likely due to prolonged antipsychotic used, abilify. Couldn't tolerate artane, gabapentin,  clonazepam and xenazine.`[id number]`IMPRESSION: Well tolerated depakote '250mg'$  bid, mood aspect is better as well. Will increase to '500mg'$  bid. ADR was discussed.  RTC 4-6 weeks.  ? Substance abuse ([place])   ? Substance induced mood disorder ([place]) 08/13/2017  ? TIA (transient ischemic attack) 2020  ? P/w BP >230/120 and neurologic symptoms, presumed TIA  ? ? ?Past Surgical History:  ?Procedure Laterality Date  ? AV FISTULA PLACEMENT Right 05/01/2021  ? Procedure: RIGHT ARTERIOVENOUS (AV) FISTULA CREATION;  Surgeon: Broadus John, MD;  Location: [facility] OR;  Service: Vascular;  Laterality: Right;  PERIPHERAL NERVE BLOCK  ? BACK SURGERY  1990  ? BASCILIC VEIN TRANSPOSITION Right 09/02/2021  ? Procedure: RIGHT SECOND STAGE BASILIC VEIN TRANSPOSITION;  Surgeon: Broadus John, MD;  Location: [facility] OR;  Service: Vascular;  Laterality: Right;  PERIPHERAL NERVE BLOCK  ? BREAST SURGERY  Right 2010  ? "breast cancer survivor" - states partial mastectomy and nodes  ? COLONOSCOPY    ? [facility]  ? ESOPHAGOGASTRODUODENOSCOPY    ? [facility]  ? ESOPHAGOGASTRODUODENOSCOPY  04/18/2020  ? [facility]  ? LAPAROSCOPIC CHOLECYSTECTOMY  2002  ? LAPAROSCOPIC INCISIONAL / UMBILICAL / VENTRAL HERNIA REPAIR  04/13/2018  ? with BARD 15x 32ZY mesh (supraumbilical)  ? LAPAROSCOPIC LYSIS OF ADHESIONS  07/12/2018  ? Procedure: LAPAROSCOPIC LYSIS OF ADHESIONS;  Surgeon: Isabel Caprice, MD;  Location: [facility] ORS;  Service: Gynecology;;  ? MULTIPLE TOOTH EXTRACTIONS    ? MYOMECTOMY    ? x 2 prior to hysterectomy  ? ROBOTIC ASSISTED BILATERAL SALPINGO OOPHERECTOMY Right 07/12/2018  ? Procedure: XI ROBOTIC ASSISTED RIGHT SALPINGO OOPHORECTOMY;  Surgeon: Isabel Caprice, MD;  Location: [facility] ORS;  Service: Gynecology;  Laterality: Right;  ? TOTAL ABDOMINAL HYSTERECTOMY    ? fibroids   ? UPPER GASTROINTESTINAL ENDOSCOPY    ? WISDOM TOOTH EXTRACTION    ? ? ?Allergies  ?Allergen Reactions  ? Abilify [Aripiprazole] Other (See Comments)  ?  Tardive  dyskinesia Oral  ? Remeron [Mirtazapine] Other (See Comments)  ?  Wgt stimulation /gain, Dizziness, Patient says "can tolerate"  ? Trazodone And Nefazodone Other (See Comments)  ?  Nightmares/sleep ditur

## 2021-10-24 ENCOUNTER — Ambulatory Visit (INDEPENDENT_AMBULATORY_CARE_PROVIDER_SITE_OTHER): Payer: Medicare Other | Admitting: Vascular Surgery

## 2021-10-24 ENCOUNTER — Ambulatory Visit (HOSPITAL_COMMUNITY)
Admission: RE | Admit: 2021-10-24 | Discharge: 2021-10-24 | Disposition: A | Discharge status: Home / Self Care | Payer: Medicare Other | Source: Ambulatory Visit | Attending: Vascular Surgery | Admitting: Vascular Surgery

## 2021-10-24 ENCOUNTER — Encounter: Payer: Self-pay | Admitting: Vascular Surgery

## 2021-10-24 VITALS — BP 223/99 | HR 71 | Temp 98.4°F | Resp 20 | Ht 62.0 in | Wt 199.0 lb

## 2021-10-24 DIAGNOSIS — Z992 Dependence on renal dialysis: Secondary | ICD-10-CM

## 2021-10-24 DIAGNOSIS — N186 End stage renal disease: Secondary | ICD-10-CM

## 2021-12-23 ENCOUNTER — Ambulatory Visit: Payer: Medicare Other | Admitting: Vascular Surgery

## 2021-12-23 ENCOUNTER — Encounter (HOSPITAL_COMMUNITY): Payer: Medicare Other

## 2021-12-26 ENCOUNTER — Encounter (HOSPITAL_COMMUNITY): Payer: Medicare Other

## 2021-12-26 ENCOUNTER — Ambulatory Visit: Payer: Medicare Other | Admitting: Vascular Surgery

## 2021-12-27 ENCOUNTER — Encounter (HOSPITAL_BASED_OUTPATIENT_CLINIC_OR_DEPARTMENT_OTHER): Payer: Self-pay | Admitting: Emergency Medicine

## 2021-12-27 ENCOUNTER — Emergency Department (HOSPITAL_BASED_OUTPATIENT_CLINIC_OR_DEPARTMENT_OTHER): Payer: Medicare Other

## 2021-12-27 ENCOUNTER — Emergency Department (HOSPITAL_BASED_OUTPATIENT_CLINIC_OR_DEPARTMENT_OTHER)
Admission: EM | Admit: 2021-12-27 | Discharge: 2021-12-27 | Disposition: A | Discharge status: Home / Self Care | Payer: Medicare Other | Attending: Emergency Medicine | Admitting: Emergency Medicine

## 2021-12-27 DIAGNOSIS — I5032 Chronic diastolic (congestive) heart failure: Secondary | ICD-10-CM | POA: Diagnosis not present

## 2021-12-27 DIAGNOSIS — Z853 Personal history of malignant neoplasm of breast: Secondary | ICD-10-CM | POA: Insufficient documentation

## 2021-12-27 DIAGNOSIS — I132 Hypertensive heart and chronic kidney disease with heart failure and with stage 5 chronic kidney disease, or end stage renal disease: Secondary | ICD-10-CM | POA: Insufficient documentation

## 2021-12-27 DIAGNOSIS — M545 Low back pain, unspecified: Secondary | ICD-10-CM | POA: Diagnosis present

## 2021-12-27 DIAGNOSIS — Z79899 Other long term (current) drug therapy: Secondary | ICD-10-CM | POA: Diagnosis not present

## 2021-12-27 DIAGNOSIS — N186 End stage renal disease: Secondary | ICD-10-CM | POA: Diagnosis not present

## 2021-12-27 DIAGNOSIS — Z992 Dependence on renal dialysis: Secondary | ICD-10-CM | POA: Diagnosis not present

## 2021-12-27 DIAGNOSIS — M5441 Lumbago with sciatica, right side: Secondary | ICD-10-CM | POA: Diagnosis not present

## 2021-12-27 MED ORDER — LIDOCAINE 5 % EX PTCH
1.0000 | MEDICATED_PATCH | CUTANEOUS | 0 refills | Status: DC
Start: 2021-12-27 — End: 2023-05-23

## 2021-12-27 MED ORDER — PREDNISONE 10 MG (21) PO TBPK: ORAL_TABLET | Freq: Every day | ORAL | 0 refills | Status: DC

## 2021-12-27 MED ORDER — HYDROMORPHONE HCL 1 MG/ML IJ SOLN
0.5000 mg | Freq: Once | INTRAMUSCULAR | Status: AC
  Administered 2021-12-27: 0.5 mg via INTRAMUSCULAR
  Filled 2021-12-27: qty 1

## 2021-12-27 MED ORDER — LIDOCAINE 5 % EX PTCH
1.0000 | MEDICATED_PATCH | CUTANEOUS | Status: DC
  Administered 2021-12-27: 1 via TRANSDERMAL
  Filled 2021-12-27: qty 1

## 2021-12-27 MED ORDER — OXYCODONE-ACETAMINOPHEN 5-325 MG PO TABS: 1.0000 | ORAL_TABLET | Freq: Four times a day (QID) | ORAL | 0 refills | Status: DC | PRN

## 2021-12-27 MED ORDER — METHOCARBAMOL 500 MG PO TABS: 500.0000 mg | ORAL_TABLET | Freq: Two times a day (BID) | ORAL | 0 refills | Status: DC

## 2021-12-27 NOTE — ED Provider Notes (Signed)
[facility] EMERGENCY DEPARTMENT Provider Note   CSN: [id number] Arrival date & time: 12/27/21  1359     History  Chief Complaint  Patient presents with   Back Pain    Sherry [name] is a 66 y.o. female.  66 year old female presents from primary care office for evaluation of back pain.  Patient has history of low back pain and states this exacerbation started yesterday.  She was seen at PCP office today for evaluation and was given injection of Toradol, and dexamethasone.  She did not have any improvement and was sent to the ED for further evaluation.  She denies fever, chills, recent trauma.  She is without bowel, or bladder dysfunction, saddle anesthesia, history of IV drug use, unintentional weight loss.  She does have history of breast cancer.  The history is provided by the patient. No language interpreter was used.       Home Medications Prior to Admission medications   Medication Sig Start Date End Date Taking? Authorizing Provider  albuterol (VENTOLIN HFA) 108 (90 Base) MCG/ACT inhaler Inhale 2 puffs into the lungs every 4 (four) hours as needed for wheezing or shortness of breath. 04/23/21   [provider]  amLODipine (NORVASC) 10 MG tablet Take 10 mg by mouth in the morning.    [provider]  benzonatate (TESSALON) 100 MG capsule Take 1 capsule (100 mg total) by mouth every 8 (eight) hours. Patient not taking: Reported on 10/24/2021 06/23/21   Suzy Bouchard, PA-C  chlordiazePOXIDE (LIBRIUM) 25 MG capsule '50mg'$  PO TID x 1D, then 25-'50mg'$  PO BID X 1D, then 25-'50mg'$  PO QD X 1D Patient not taking: Reported on 10/24/2021 05/27/21   Deno Etienne, DO  Cholecalciferol (VITAMIN D3) 50 MCG (2000 UT) capsule Take 1 capsule (2,000 Units total) by mouth daily. 09/26/20   Wendie Agreste, MD  docusate sodium (COLACE) 100 MG capsule Take 100 mg by mouth daily as needed for mild constipation.    [provider]  ferrous sulfate 325 (65 FE) MG tablet  Take 325 mg by mouth in the morning and at bedtime. Patient not taking: Reported on 10/24/2021    [provider]  FLUoxetine (PROZAC) 40 MG capsule Take 40 mg by mouth in the morning. 11/09/18   [provider]  furosemide (LASIX) 40 MG tablet Take 40 mg by mouth daily. Patient not taking: Reported on 10/24/2021 04/30/21   [provider]  gabapentin (NEURONTIN) 300 MG capsule Take 300 mg by mouth 3 (three) times daily. 09/04/21   [provider]  hydrALAZINE (APRESOLINE) 50 MG tablet Take 100 mg by mouth 3 (three) times daily. 03/24/21   [provider]  INGREZZA 80 MG CAPS Take 80 mg by mouth at bedtime. 10/15/17   [provider]  isosorbide mononitrate (IMDUR) 60 MG 24 hr tablet Take 60 mg by mouth in the morning.    [provider]  labetalol (NORMODYNE) 300 MG tablet Take 300 mg by mouth 2 (two) times daily.    [provider]  lidocaine (LIDODERM) 5 % Place 1 patch onto the skin daily. Remove & Discard patch within 12 hours or as directed by MD 09/01/21   Montine Circle, PA-C  Melatonin 10 MG TABS Take 10 mg by mouth daily as needed (sleep).    [provider]  ondansetron (ZOFRAN-ODT) 4 MG disintegrating tablet Take 4 mg by mouth every 8 (eight) hours as needed for vomiting or nausea. 03/26/21   [provider]  oxyCODONE-acetaminophen (PERCOCET) 7.5-325 MG tablet Take 1 tablet by mouth every 4 (four) hours as needed for severe pain. Patient not taking: Reported on 10/24/2021 09/09/21   Cherre Robins, MD  pantoprazole (PROTONIX) 40 MG tablet Take 40 mg by mouth daily. 04/05/21   [provider]  predniSONE (DELTASONE) 20 MG tablet Take 20 mg by mouth See admin instructions. Qd x  5 days Patient not taking: Reported on 10/24/2021 08/24/21   [provider]  sodium bicarbonate 650 MG tablet Take 1,300 mg by mouth 2 (two) times daily. 04/30/21   [provider]      Allergies     Abilify [aripiprazole], Remeron [mirtazapine], Trazodone and nefazodone, Flexeril [cyclobenzaprine], and Amoxicillin    Review of Systems   Review of Systems  Constitutional:  Negative for appetite change, chills and fever.  Respiratory:  Negative for shortness of breath.   Cardiovascular:  Negative for chest pain and leg swelling.  Gastrointestinal:  Negative for abdominal pain.  Genitourinary:  Negative for difficulty urinating.  Neurological:  Negative for weakness and numbness.  All other systems reviewed and are negative.   Physical Exam Updated Vital Signs BP (!) 157/85   Pulse 73   Temp 98.5 F (36.9 C) (Oral)   Resp 16   SpO2 100%  Physical Exam Vitals and nursing note reviewed.  Constitutional:      General: She is not in acute distress.    Appearance: Normal appearance. She is not ill-appearing.  HENT:     Head: Normocephalic and atraumatic.     Nose: Nose normal.  Eyes:     General: No scleral icterus.    Extraocular Movements: Extraocular movements intact.     Conjunctiva/sclera: Conjunctivae normal.  Cardiovascular:     Rate and Rhythm: Normal rate and regular rhythm.     Pulses: Normal pulses.     Heart sounds: Normal heart sounds.  Pulmonary:     Effort: Pulmonary effort is normal. No respiratory distress.     Breath sounds: Normal breath sounds. No wheezing or rales.  Abdominal:     General: There is no distension.     Tenderness: There is no abdominal tenderness.  Musculoskeletal:        General: Normal range of motion.     Cervical back: Normal range of motion.     Comments: Cervical and thoracic spine without tenderness to palpation.  Lumbar spine with tenderness to palpation along with lumbar paraspinal muscle tenderness.  Preserved range of motion in bilateral lower extremities.  Intact strength in bilateral lower extremities and extensor and flexor muscle groups.  Straight leg raise test positive on the right.  Straight leg raise test negative on  the left side.  Skin:    General: Skin is warm and dry.  Neurological:     General: No focal deficit present.     Mental Status: She is alert. Mental status is at baseline.     ED Results / Procedures / Treatments   Labs (all labs ordered are listed, but only abnormal results are displayed) Labs Reviewed - No data to display  EKG None  Radiology No results found.  Procedures Procedures    Medications Ordered in ED Medications  lidocaine (LIDODERM) 5 % 1 patch (has no administration in time range)  HYDROmorphone (DILAUDID) injection 0.5 mg (has no administration in time range)    ED Course/ Medical Decision Making/ A&P  Medical Decision Making Risk Prescription drug management.   Medical Decision Making / ED Course   This patient presents to the ED for concern of back pain, this involves an extensive number of treatment options, and is a complaint that carries with it a high risk of complications and morbidity.  The differential diagnosis includes cauda equina syndrome, spinal epidural abscess, sciatica, muscle strength  MDM: 66 year old female presents from PCP office for evaluation of back pain.  She has history of chronic low back pain.  She has history of L4-L5 spondylolisthesis and is status post lumbar fusion in April 2022 with Dr. Arnoldo Morale of [facility] neurosurgery.  Exam overall reassuring.  Although she does have midline spinal process tenderness of the lumbar spine along with the right lumbar paraspinal muscle tenderness.  She is without red flag signs or symptoms concerning for cauda equina or spinal epidural abscess.  CT imaging shows narrowing around the L3, and L4 spinal level.  No other acute or concerning findings.  Hardware intact.  Given patient's history of breast cancer we did consider MRI to evaluate for cauda equina however given lack of other red flag signs or symptoms will defer this at this time.  Strict return precautions  provided to patient to return for any red flag signs including fever, weakness, difficulty ambulating, lack of bowel or bladder control, saddle anesthesia.  Patient voices understanding and is in agreement with plan.  Discussed importance of follow-up with PCP on either Monday or Tuesday for reevaluation.  Patient reports improvement following pain medication in the emergency room.  She is now resting comfortably.  We will provide patient with a few doses of pain medication for severe breakthrough pain.  We will give patient Medrol Dosepak, muscle relaxer, as well as lidocaine patch.  Extensive discussion had with patient regarding her potential pain contract that she has with her PCP.  She does get prescribed tramadol monthly.  She states she is out of this medication.  She states that her PCP said it would be okay if the emergency room provided her with pain medication.  I did have a discussion with her if she picks up this narcotic medication that I prescribed for her and if she gets dismissed from her pain clinic that the emergency room will not be able to continue treating her chronic pain.  She voices understanding and is in agreement with this. Case discussed with attending who is in agreement.   Additional history obtained: -Additional history obtained from chart review confirming patient has history of L4-L5 spondylolisthesis confirming patient is status post lumbar fusion in April 2022 -External records from outside source obtained and reviewed including: Chart review including previous notes, labs, imaging, consultation notes   Lab Tests: -I ordered, reviewed, and interpreted labs.   The pertinent results include:   Labs Reviewed - No data to display    EKG  EKG Interpretation  Date/Time:    Ventricular Rate:    PR Interval:    QRS Duration:   QT Interval:    QTC Calculation:   R Axis:     Text Interpretation:           Imaging Studies ordered: I ordered imaging studies  including CT lumbar spine I independently visualized and interpreted imaging. I agree with the radiologist interpretation   Medicines ordered and prescription drug management: Meds ordered this encounter  Medications   lidocaine (LIDODERM) 5 % 1 patch   HYDROmorphone (DILAUDID) injection 0.5 mg    -I have  reviewed the patients home medicines and have made adjustments as needed  Reevaluation: After the interventions noted above, I reevaluated the patient and found that they have :improved  Co morbidities that complicate the patient evaluation  Past Medical History:  Diagnosis Date   Alcohol abuse 05/09/2019   Alcohol use disorder, severe, dependence ([place]) 08/27/2017   Alcohol withdrawal ([place]) 08/09/2017   Anxiety    ARF (acute renal failure) ([place]) 04/09/2020   Arthropathy of lumbar facet joint 05/24/2020   Bilateral low back pain with bilateral sciatica 01/05/2019   Bipolar and related disorder ([place])    Blood transfusion without reported diagnosis    Body mass index (BMI) 40.0-44.9, adult ([place]) 07/19/2020   Breast cancer ([place])    right lumpectemy and lymph node    Cancer ([place]) 09/06/2017   Cervical spondylosis 05/24/2020   Chest pain 05/31/2020   Chest pain, rule out acute myocardial infarction 05/29/2020   CHF (congestive heart failure) (HCC)    Chronic back pain    Chronic bilateral low back pain with right-sided sciatica 05/24/2020   Chronic diastolic CHF (congestive heart failure) ([place]) 05/09/2019   Chronic kidney disease    "Stage IV" - states due to hypertension   Chronic kidney disease, stage III (moderate) ([place]) 11/17/2016   Chronic low back pain 09/06/2017   Disc levels:      No abnormality at L2-3 or above.      L3-4:  Mild bulging of the disc.  No stenosis.      L4-5: Bilateral facet arthropathy with gaping, fluid-filled joints.   Joint edema. Anterolisthesis would likely occur at this level with   standing and flexion. Disc degeneration with mild bulging of the    disc. Mild narrowing of the lateral recesses and foramina, which   would likely worsen   Cigarette smoker 12/31/2017   CKD (chronic kidney disease) stage 4, GFR 15-29 ml/min (HCC) 04/18/2020   Clonus 05/24/2020   Cyst of ovary    Depression    Depression with anxiety 08/21/2016   Dizziness 02/24/2018   DOE (dyspnea on exertion) 12/30/2017   12/30/2017   Walked RA x one lap @ 185 stopped due to  Sob/ lightheaded weak with sats 96% at end - Spirometry 12/30/2017  FEV1 1.27 (68%)  Ratio 99 s curvature / abn effort dep portion only     Dyspnea    Dyspnea and respiratory abnormality 02/13/2013   Formatting of this note might be different from the original. STORY: PSG on 05/20/12 showed no OSA but upper airway resistance syndrome, AHI 3.9, severe snoring was noted. ESS=8. UARS. Advise positional therapy, weight loss program, regular exercise.   ESRD (end stage renal disease) on dialysis ([place]) 08/22/2021   M-W-F   Essential hypertension 07/06/2017   GERD (gastroesophageal reflux disease)    Grief reaction with prolonged bereavement 09/06/2017   Partner of 18 yrs left 2018-pt admits she still loves her   Hepatitis C    recieved treatment for Hep C   Hepatitis C virus infection cured after antiviral drug therapy 12/17/2012   Telephone Encounter - Dub Mikes, RN - 12/28/2016 9:54 AM EDT Patient called for HCV RNA test results. EOT 08-26-16 - not detected. 12 week post treatment 12-11-16 - not detected. Informed patient that she was cured. Patient verbalized understanding.          History of breast cancer 02/13/2013   Hx of bipolar disorder 01/31/2016   Hypertension    Hypertensive emergency 10/21/2017   Impaired  functional mobility, balance, gait, and endurance 01/09/2021   Left-sided weakness    MDD (major depressive disorder), recurrent severe, without psychosis ([place])    Medication intolerance 09/06/2017   Remeron-appetite stimulant/Dizziness/Dysphoria   Microcytic anemia 10/21/2017   Morbid  obesity due to excess calories ([place]) 02/13/2013   BMI 42.9   MRSA infection    of breast incision   Nausea 04/09/2020   Formatting of this note might be different from the original. Added automatically from request for surgery [id number]   Near syncope 05/29/2020   Neck pain 05/24/2020   Neuroleptic-induced tardive dyskinesia 09/06/2017   Abilify   Opioid dependence ([place])    Has been treated in [facility] in past   Opioid use disorder, severe, dependence ([place]) 08/27/2017   Oral aphthous ulcer 06/25/2015   Last Assessment & Plan:  Formatting of this note might be different from the original. - Appear to be resolving as a be expected in the case of aphthous stomatitis. - Continue topical therapy with viscous lidocaine.  Recommended trying high-dose anti-inflammatory such as 600 mg of ibuprofen as needed. - She'll return to ENT clinic if symptoms do not resolve.   Phlebitis after infusion 10/28/2017   Rt antecubital fossa   Pre-operative cardiovascular examination 06/13/2018   Restless legs syndrome 02/13/2013   Formatting of this note might be different from the original. IMPRESSION: Possible. Will follow.   Spondylolisthesis, lumbar region 10/21/2020   Subacute dyskinesia due to drug 02/13/2013   Formatting of this note might be different from the original. STORY: Tardive dyskinesia and mild limb dyskinesia, LE>UE which likely due to prolonged antipsychotic used, abilify. Couldn't tolerate artane, gabapentin, clonazepam and xenazine.`E1o3L`IMPRESSION: Well tolerated depakote '250mg'$  bid, mood aspect is better as well. Will increase to '500mg'$  bid. ADR was discussed.  RTC 4-6 weeks.   Substance abuse ([place])    Substance induced mood disorder ([place]) 08/13/2017   TIA (transient ischemic attack) 2020   P/w BP >230/120 and neurologic symptoms, presumed TIA      Dispostion: Patient is appropriate for discharge.  Discharged in stable condition.  Return precautions discussed.  Patient voices  understanding and is in agreement with plan.   Final Clinical Impression(s) / ED Diagnoses Final diagnoses:  Right-sided low back pain with right-sided sciatica, unspecified chronicity    Rx / DC Orders ED Discharge Orders          Ordered    oxyCODONE-acetaminophen (PERCOCET/ROXICET) 5-325 MG tablet  Every 6 hours PRN        12/27/21 1649    predniSONE (STERAPRED UNI-PAK 21 TAB) 10 MG (21) TBPK tablet  Daily        12/27/21 1649    lidocaine (LIDODERM) 5 %  Every 24 hours        12/27/21 1649    methocarbamol (ROBAXIN) 500 MG tablet  2 times daily        12/27/21 1649              Evlyn Courier, PA-C 12/27/21 1649    Lennice Sites, DO 12/27/21 1801

## 2021-12-27 NOTE — ED Triage Notes (Signed)
Per EMS. Pt from [facility] PCP office. Pt reports midline lower back pain for the past week. No known injury. Pt went to [facility] ED yesterday for same. Followed up with PCP today. Was given toradol and dexamethasone in the office, but did not help pain so was sent here for further evaluation. Pt able to stand and sit with assistance from EMS stretcher.

## 2021-12-27 NOTE — Discharge Instructions (Addendum)
Your exam today was overall reassuring.  You likely have sciatica due to acute on chronic low back pain.  CT scan did not show anything concerning.  As discussed if you have any concerning symptoms such as numbness tingling in your groin, difficulty controlling your bowel or bladder, inability to walk, fever please return to the emergency room immediately for evaluation.  He had improvement in your pain after medications in the emergency room.  I have sent pain medication into the pharmacy for you.  We discussed that you potentially have a pain contract with your primary care provider.  You stated that your primary care provider stated that it was okay for you to pick up pain medication as prescribed from the emergency room.  If you get dismissed from your practice due to picking up this prescription be aware that emergency room will not be able to continue supplying your pain medication supply.  Have also sent steroids, muscle relaxer, and lidocaine patch to the pharmacy for you.  I recommend you follow-up with your primary care provider and 2 to 3 days for reevaluation.

## 2021-12-31 ENCOUNTER — Other Ambulatory Visit: Payer: Self-pay | Admitting: *Deleted

## 2021-12-31 DIAGNOSIS — N186 End stage renal disease: Secondary | ICD-10-CM

## 2022-01-07 NOTE — Progress Notes (Signed)
HISTORY AND PHYSICAL    HPI: This is a 66 y.o. female presenting in follow-up status post right brachiobasilic fistula transposition.   Last seen, again was had issues with right brachiobasilic fistula cannulation as well as significant pain associated.  Several weeks ago, the fistula was infiltrated, and has not been used since.  She is currently being dialyzed through a right-sided tunneled HD catheter.  She denies symptoms of steal syndrome.  Denies pain at her incision sites.  The pt is right hand dominant.    Pt is on dialysis.   Days of dialysis if applicable:  M/W/F    HD center if applicable:  [facility].  The pt is not on a statin for cholesterol management.  The pt is not on a daily aspirin.  Other AC:  none The pt is on CCB, BB, hydralazine, diuretic for hypertension.  The pt is not diabetic.   Tobacco hx:  current  Past Medical History:  Diagnosis Date   Alcohol abuse 05/09/2019   Alcohol use disorder, severe, dependence ([place]) 08/27/2017   Alcohol withdrawal ([place]) 08/09/2017   Anxiety    ARF (acute renal failure) ([place]) 04/09/2020   Arthropathy of lumbar facet joint 05/24/2020   Bilateral low back pain with bilateral sciatica 01/05/2019   Bipolar and related disorder ([place])    Blood transfusion without reported diagnosis    Body mass index (BMI) 40.0-44.9, adult ([place]) 07/19/2020   Breast cancer ([place])    right lumpectemy and lymph node    Cancer ([place]) 09/06/2017   Cervical spondylosis 05/24/2020   Chest pain 05/31/2020   Chest pain, rule out acute myocardial infarction 05/29/2020   CHF (congestive heart failure) (HCC)    Chronic back pain    Chronic bilateral low back pain with right-sided sciatica 05/24/2020   Chronic diastolic CHF (congestive heart failure) ([place]) 05/09/2019   Chronic kidney disease    "Stage IV" - states due to hypertension   Chronic kidney disease, stage III (moderate) ([place]) 11/17/2016   Chronic low back pain 09/06/2017   Disc  levels:      No abnormality at L2-3 or above.      L3-4:  Mild bulging of the disc.  No stenosis.      L4-5: Bilateral facet arthropathy with gaping, fluid-filled joints.   Joint edema. Anterolisthesis would likely occur at this level with   standing and flexion. Disc degeneration with mild bulging of the   disc. Mild narrowing of the lateral recesses and foramina, which   would likely worsen   Cigarette smoker 12/31/2017   CKD (chronic kidney disease) stage 4, GFR 15-29 ml/min (HCC) 04/18/2020   Clonus 05/24/2020   Cyst of ovary    Depression    Depression with anxiety 08/21/2016   Dizziness 02/24/2018   DOE (dyspnea on exertion) 12/30/2017   12/30/2017   Walked RA x one lap @ 185 stopped due to  Sob/ lightheaded weak with sats 96% at end - Spirometry 12/30/2017  FEV1 1.27 (68%)  Ratio 99 s curvature / abn effort dep portion only     Dyspnea    Dyspnea and respiratory abnormality 02/13/2013   Formatting of this note might be different from the original. STORY: PSG on 05/20/12 showed no OSA but upper airway resistance syndrome, AHI 3.9, severe snoring was noted. ESS=8. UARS. Advise positional therapy, weight loss program, regular exercise.   ESRD (end stage renal disease) on dialysis ([place]) 08/22/2021   M-W-F   Essential hypertension 07/06/2017  GERD (gastroesophageal reflux disease)    Grief reaction with prolonged bereavement 09/06/2017   Partner of 18 yrs left 2018-pt admits she still loves her   Hepatitis C    recieved treatment for Hep C   Hepatitis C virus infection cured after antiviral drug therapy 12/17/2012   Telephone Encounter - Dub Mikes, RN - 12/28/2016 9:54 AM EDT Patient called for HCV RNA test results. EOT 08-26-16 - not detected. 12 week post treatment 12-11-16 - not detected. Informed patient that she was cured. Patient verbalized understanding.          History of breast cancer 02/13/2013   Hx of bipolar disorder 01/31/2016   Hypertension    Hypertensive emergency  10/21/2017   Impaired functional mobility, balance, gait, and endurance 01/09/2021   Left-sided weakness    MDD (major depressive disorder), recurrent severe, without psychosis ([place])    Medication intolerance 09/06/2017   Remeron-appetite stimulant/Dizziness/Dysphoria   Microcytic anemia 10/21/2017   Morbid obesity due to excess calories ([place]) 02/13/2013   BMI 42.9   MRSA infection    of breast incision   Nausea 04/09/2020   Formatting of this note might be different from the original. Added automatically from request for surgery [id number]   Near syncope 05/29/2020   Neck pain 05/24/2020   Neuroleptic-induced tardive dyskinesia 09/06/2017   Abilify   Opioid dependence ([place])    Has been treated in [facility] in past   Opioid use disorder, severe, dependence ([place]) 08/27/2017   Oral aphthous ulcer 06/25/2015   Last Assessment & Plan:  Formatting of this note might be different from the original. - Appear to be resolving as a be expected in the case of aphthous stomatitis. - Continue topical therapy with viscous lidocaine.  Recommended trying high-dose anti-inflammatory such as 600 mg of ibuprofen as needed. - She'll return to ENT clinic if symptoms do not resolve.   Phlebitis after infusion 10/28/2017   Rt antecubital fossa   Pre-operative cardiovascular examination 06/13/2018   Restless legs syndrome 02/13/2013   Formatting of this note might be different from the original. IMPRESSION: Possible. Will follow.   Spondylolisthesis, lumbar region 10/21/2020   Subacute dyskinesia due to drug 02/13/2013   Formatting of this note might be different from the original. STORY: Tardive dyskinesia and mild limb dyskinesia, LE>UE which likely due to prolonged antipsychotic used, abilify. Couldn't tolerate artane, gabapentin, clonazepam and xenazine.`E1o3L`IMPRESSION: Well tolerated depakote '250mg'$  bid, mood aspect is better as well. Will increase to '500mg'$  bid. ADR was discussed.  RTC 4-6 weeks.    Substance abuse ([place])    Substance induced mood disorder ([place]) 08/13/2017   TIA (transient ischemic attack) 2020   P/w BP >230/120 and neurologic symptoms, presumed TIA    Past Surgical History:  Procedure Laterality Date   AV FISTULA PLACEMENT Right 05/01/2021   Procedure: RIGHT ARTERIOVENOUS (AV) FISTULA CREATION;  Surgeon: Broadus John, MD;  Location: [facility];  Service: Vascular;  Laterality: Right;  PERIPHERAL NERVE BLOCK   BACK SURGERY  8676   BASCILIC VEIN TRANSPOSITION Right 09/02/2021   Procedure: RIGHT SECOND STAGE BASILIC VEIN TRANSPOSITION;  Surgeon: Broadus John, MD;  Location: [facility];  Service: Vascular;  Laterality: Right;  PERIPHERAL NERVE BLOCK   BREAST SURGERY Right 2010   "breast cancer survivor" - states partial mastectomy and nodes   COLONOSCOPY     [facility]   ESOPHAGOGASTRODUODENOSCOPY     [facility]   ESOPHAGOGASTRODUODENOSCOPY  04/18/2020   [facility]  LAPAROSCOPIC CHOLECYSTECTOMY  2002   LAPAROSCOPIC INCISIONAL / UMBILICAL / VENTRAL HERNIA REPAIR  04/13/2018   with BARD 15x 29JJ mesh (supraumbilical)   LAPAROSCOPIC LYSIS OF ADHESIONS  07/12/2018   Procedure: LAPAROSCOPIC LYSIS OF ADHESIONS;  Surgeon: Isabel Caprice, MD;  Location: [facility];  Service: Gynecology;;   MULTIPLE TOOTH EXTRACTIONS     MYOMECTOMY     x 2 prior to hysterectomy   ROBOTIC ASSISTED BILATERAL SALPINGO OOPHERECTOMY Right 07/12/2018   Procedure: XI ROBOTIC ASSISTED RIGHT SALPINGO OOPHORECTOMY;  Surgeon: Isabel Caprice, MD;  Location: [facility];  Service: Gynecology;  Laterality: Right;   TOTAL ABDOMINAL HYSTERECTOMY     fibroids    UPPER GASTROINTESTINAL ENDOSCOPY     WISDOM TOOTH EXTRACTION      Allergies  Allergen Reactions   Abilify [Aripiprazole] Other (See Comments)    Tardive dyskinesia Oral   Remeron [Mirtazapine] Other (See Comments)    Wgt stimulation /gain, Dizziness, Patient says "can tolerate"   Trazodone And Nefazodone Other (See Comments)     Nightmares/sleep diturbance   Flexeril [Cyclobenzaprine] Other (See Comments)    Pt states Flexeril makes her feel depressed    Amoxicillin Diarrhea and Other (See Comments)    NOTE the patient has had PCN WITHOUT reaction Has patient had a PCN reaction causing immediate rash, facial/tongue/throat swelling, SOB or lightheadedness with hypotension: No Has patient had a PCN reaction causing severe rash involving mucus membranes or skin necrosis: No Has patient had a PCN reaction that required hospitalization: No Has patient had a PCN reaction occurring within the last 10 years: No If all of the above answers are "NO", then may proceed with Cephalosporin use.     Current Outpatient Medications  Medication Sig Dispense Refill   albuterol (VENTOLIN HFA) 108 (90 Base) MCG/ACT inhaler Inhale 2 puffs into the lungs every 4 (four) hours as needed for wheezing or shortness of breath.     amLODipine (NORVASC) 10 MG tablet Take 10 mg by mouth in the morning.     benzonatate (TESSALON) 100 MG capsule Take 1 capsule (100 mg total) by mouth every 8 (eight) hours. (Patient not taking: Reported on 10/24/2021) 21 capsule 0   chlordiazePOXIDE (LIBRIUM) 25 MG capsule '50mg'$  PO TID x 1D, then 25-'50mg'$  PO BID X 1D, then 25-'50mg'$  PO QD X 1D (Patient not taking: Reported on 10/24/2021) 10 capsule 0   Cholecalciferol (VITAMIN D3) 50 MCG (2000 UT) capsule Take 1 capsule (2,000 Units total) by mouth daily. 30 capsule 0   docusate sodium (COLACE) 100 MG capsule Take 100 mg by mouth daily as needed for mild constipation.     ferrous sulfate 325 (65 FE) MG tablet Take 325 mg by mouth in the morning and at bedtime. (Patient not taking: Reported on 10/24/2021)     FLUoxetine (PROZAC) 40 MG capsule Take 40 mg by mouth in the morning.     furosemide (LASIX) 40 MG tablet Take 40 mg by mouth daily. (Patient not taking: Reported on 10/24/2021)     gabapentin (NEURONTIN) 300 MG capsule Take 300 mg by mouth 3 (three) times daily.      hydrALAZINE (APRESOLINE) 50 MG tablet Take 100 mg by mouth 3 (three) times daily.     INGREZZA 80 MG CAPS Take 80 mg by mouth at bedtime.     isosorbide mononitrate (IMDUR) 60 MG 24 hr tablet Take 60 mg by mouth in the morning.     labetalol (NORMODYNE) 300 MG tablet Take 300 mg  by mouth 2 (two) times daily.     lidocaine (LIDODERM) 5 % Place 1 patch onto the skin daily. Remove & Discard patch within 12 hours or as directed by MD 30 patch 0   Melatonin 10 MG TABS Take 10 mg by mouth daily as needed (sleep).     methocarbamol (ROBAXIN) 500 MG tablet Take 1 tablet (500 mg total) by mouth 2 (two) times daily. 20 tablet 0   ondansetron (ZOFRAN-ODT) 4 MG disintegrating tablet Take 4 mg by mouth every 8 (eight) hours as needed for vomiting or nausea.     oxyCODONE-acetaminophen (PERCOCET/ROXICET) 5-325 MG tablet Take 1 tablet by mouth every 6 (six) hours as needed for severe pain. 10 tablet 0   pantoprazole (PROTONIX) 40 MG tablet Take 40 mg by mouth daily.     predniSONE (STERAPRED UNI-PAK 21 TAB) 10 MG (21) TBPK tablet Take by mouth daily. Take 6 tabs by mouth daily  for 2 days, then 5 tabs for 2 days, then 4 tabs for 2 days, then 3 tabs for 2 days, 2 tabs for 2 days, then 1 tab by mouth daily for 2 days 42 tablet 0   sodium bicarbonate 650 MG tablet Take 1,300 mg by mouth 2 (two) times daily.     No current facility-administered medications for this visit.    Family History  Problem Relation Age of Onset   Heart attack Mother    Breast cancer Mother 53   Dementia Mother    Cancer Father 79       died of bleeding from kidneys   Colon cancer Neg Hx    Esophageal cancer Neg Hx    Stomach cancer Neg Hx    Rectal cancer Neg Hx     Social History   Socioeconomic History   Marital status: Single    Spouse name: Not on file   Number of children: 0   Years of education: 12   Highest education level: High school graduate  Occupational History   Occupation: Disabled  Tobacco Use   Smoking  status: Every Day    Packs/day: 0.50    Types: Cigarettes    Passive exposure: Never   Smokeless tobacco: Never  Vaping Use   Vaping Use: Former   Start date: 06/27/2013   Quit date: 06/14/2017   Devices: Apple Cinnamon-0 mg  Substance and Sexual Activity   Alcohol use: Not Currently    Comment: hx over 30 years   Drug use: Not Currently    Comment: h/o IVD use   Sexual activity: Not Currently  Other Topics Concern   Not on file  Social History Narrative   Lives at home alone.   Right-handed.   Caffeine use: 4 cups coffee/soda   Social Determinants of Health   Financial Resource Strain: Not on file  Food Insecurity: Not on file  Transportation Needs: Not on file  Physical Activity: Not on file  Stress: Not on file  Social Connections: Not on file  Intimate Partner Violence: Not on file     ROS: '[x]'$  Positive   '[ ]'$  Negative   '[ ]'$  All sytems reviewed and are negative  Cardiac: '[x]'$  hx CHF   Vascular: '[]'$  pain in legs while walking  Pulmonary: '[]'$  asthma '[]'$  wheezing  Hematologic/oncology '[x]'$  breast cancer  Musculoskeletal '[x]'$  chronic back issues  GU: '[x]'$  CKD/renal failure  '[x]'$  HD---'[x]'$  M/W/F '[]'$  T/T/S  Psychiatric: '[x]'$  hx of depression  Integumentary: '[]'$  rashes '[]'$  ulcers  Constitutional: '[]'$  fever '[]'$   chills   PHYSICAL EXAMINATION:  There were no vitals filed for this visit.   There is no height or weight on file to calculate BMI.    General:  WDWN female in NAD Gait: Normal HENT: WNL Pulmonary: normal non-labored breathing  Cardiac: regular Skin: without rashes Vascular Exam/Pulses:  Palpable right radial pulse Extremities:  excellent thrill in fistula; right hand is warm and well perfused.  No evidence of hematoma in the upper arm.  Pulsatility appreciated in the antecubital fossa Musculoskeletal: no muscle wasting or atrophy  Neurologic: A&O X 3; Speech is fluent/normal  Non-Invasive Vascular Imaging:   Findings:   Findings: +--------------------+----------+-----------------+--------+ AVF                 PSV (cm/s)Flow Vol (mL/min)Comments +--------------------+----------+-----------------+--------+ Native artery inflow   189          1042                +--------------------+----------+-----------------+--------+ AVF Anastomosis        230                              +--------------------+----------+-----------------+--------+     +------------+----------+-------------+-----------+----------------------------+ OUTFLOW VEINPSV (cm/s)Diameter (cm)Depth (cm)           Describe           +------------+----------+-------------+-----------+----------------------------+ Prox UA        418        0.53        1.44      proximal , axillary area   +------------+----------+-------------+-----------+----------------------------+ Mid UA      275 / 724  0.44 / 0.20 0.44 / 0.64   change in Diameter and                                                    approx 2.48 cm in length   +------------+----------+-------------+-----------+----------------------------+ Dist UA        279        0.43        0.77                                 +------------+----------+-------------+-----------+----------------------------+ AC Fossa       272        0.56        1.04                                 +------------+----------+-------------+-----------+----------------------------+  ASSESSMENT/PLAN: 66 y.o. female status post second stage basilic vein transposition 09/02/21.   Harl Bowie had difficulty with cannulation with recent infiltration and significant pain associated. Fistula ultrasound reviewed demonstrating adequate flow with an area of stenosis in the mid upper arm.  There is also more depth appreciated on today's ultrasound than previous.  Shunda would benefit from a right upper extremity fistulogram in an effort to define, and hopefully improve flow through the fistula.   While this will not improve the pain that she appreciates during cannulation, my goal is to increase the size of the fistula for improved accuracy to limit filtration.  I counseled Chasity that should her fistula continues to have problems, she would be best served with ligation  and new HD access.  Discussed the risk and benefits of fistulogram, [place] elected to proceed.   Broadus John MD [facility] Phone Number: [phone] 01/07/2022 12:50 PM

## 2022-01-09 ENCOUNTER — Ambulatory Visit (HOSPITAL_COMMUNITY)
Admission: RE | Admit: 2022-01-09 | Discharge: 2022-01-09 | Disposition: A | Discharge status: Home / Self Care | Payer: Medicare Other | Source: Ambulatory Visit | Attending: Vascular Surgery | Admitting: Vascular Surgery

## 2022-01-09 ENCOUNTER — Encounter: Payer: Self-pay | Admitting: Vascular Surgery

## 2022-01-09 ENCOUNTER — Ambulatory Visit (INDEPENDENT_AMBULATORY_CARE_PROVIDER_SITE_OTHER): Payer: Medicare Other | Admitting: Vascular Surgery

## 2022-01-09 ENCOUNTER — Other Ambulatory Visit: Payer: Self-pay

## 2022-01-09 VITALS — BP 171/80 | HR 70 | Temp 97.9°F | Resp 16 | Ht 62.0 in | Wt 186.0 lb

## 2022-01-09 DIAGNOSIS — T82590A Other mechanical complication of surgically created arteriovenous fistula, initial encounter: Secondary | ICD-10-CM

## 2022-01-09 DIAGNOSIS — Z992 Dependence on renal dialysis: Secondary | ICD-10-CM

## 2022-01-09 DIAGNOSIS — N186 End stage renal disease: Secondary | ICD-10-CM | POA: Insufficient documentation

## 2022-01-14 ENCOUNTER — Encounter (HOSPITAL_COMMUNITY): Admission: RE | Payer: Self-pay | Source: Home / Self Care

## 2022-01-14 ENCOUNTER — Ambulatory Visit (HOSPITAL_COMMUNITY): Admission: RE | Admit: 2022-01-14 | Payer: Medicare Other | Source: Home / Self Care | Admitting: Vascular Surgery

## 2022-01-14 SURGERY — A/V FISTULAGRAM
Anesthesia: LOCAL | Laterality: Right

## 2022-01-26 ENCOUNTER — Telehealth: Payer: Self-pay

## 2022-01-26 ENCOUNTER — Other Ambulatory Visit: Payer: Self-pay

## 2022-01-26 DIAGNOSIS — T82590A Other mechanical complication of surgically created arteriovenous fistula, initial encounter: Secondary | ICD-10-CM

## 2022-01-26 NOTE — Telephone Encounter (Signed)
Received a call from [place] at [facility] requesting to r/s patient for right arm fistulogram on a non-dialysis day (T/TH). Patient previously no-showed for appointment on July 5 with Dr. Virl Cagey. Rescheduled patient on July 20 with Dr. Carlis Abbott. Instructions reviewed. She verbalized understanding. Will also fax a letter for patient to [facility] at [phone] as requested.

## 2022-01-28 ENCOUNTER — Emergency Department (HOSPITAL_COMMUNITY): Payer: Medicare Other

## 2022-01-28 ENCOUNTER — Inpatient Hospital Stay (HOSPITAL_COMMUNITY)
Admission: EM | Admit: 2022-01-28 | Discharge: 2022-02-01 | DRG: 252 | Disposition: A | Discharge status: Home / Self Care | Payer: Medicare Other | Attending: Family Medicine | Admitting: Family Medicine

## 2022-01-28 ENCOUNTER — Other Ambulatory Visit: Payer: Self-pay

## 2022-01-28 ENCOUNTER — Encounter (HOSPITAL_COMMUNITY): Payer: Self-pay

## 2022-01-28 DIAGNOSIS — T82510A Breakdown (mechanical) of surgically created arteriovenous fistula, initial encounter: Secondary | ICD-10-CM | POA: Diagnosis not present

## 2022-01-28 DIAGNOSIS — Z9049 Acquired absence of other specified parts of digestive tract: Secondary | ICD-10-CM

## 2022-01-28 DIAGNOSIS — Z8659 Personal history of other mental and behavioral disorders: Secondary | ICD-10-CM

## 2022-01-28 DIAGNOSIS — F319 Bipolar disorder, unspecified: Secondary | ICD-10-CM | POA: Diagnosis present

## 2022-01-28 DIAGNOSIS — I5032 Chronic diastolic (congestive) heart failure: Secondary | ICD-10-CM | POA: Diagnosis present

## 2022-01-28 DIAGNOSIS — F17210 Nicotine dependence, cigarettes, uncomplicated: Secondary | ICD-10-CM | POA: Diagnosis present

## 2022-01-28 DIAGNOSIS — Z853 Personal history of malignant neoplasm of breast: Secondary | ICD-10-CM

## 2022-01-28 DIAGNOSIS — Z881 Allergy status to other antibiotic agents status: Secondary | ICD-10-CM

## 2022-01-28 DIAGNOSIS — T82590A Other mechanical complication of surgically created arteriovenous fistula, initial encounter: Secondary | ICD-10-CM | POA: Diagnosis present

## 2022-01-28 DIAGNOSIS — F419 Anxiety disorder, unspecified: Secondary | ICD-10-CM | POA: Diagnosis present

## 2022-01-28 DIAGNOSIS — K219 Gastro-esophageal reflux disease without esophagitis: Secondary | ICD-10-CM | POA: Diagnosis present

## 2022-01-28 DIAGNOSIS — F1120 Opioid dependence, uncomplicated: Secondary | ICD-10-CM | POA: Diagnosis present

## 2022-01-28 DIAGNOSIS — Z888 Allergy status to other drugs, medicaments and biological substances status: Secondary | ICD-10-CM

## 2022-01-28 DIAGNOSIS — F1910 Other psychoactive substance abuse, uncomplicated: Secondary | ICD-10-CM | POA: Diagnosis present

## 2022-01-28 DIAGNOSIS — R109 Unspecified abdominal pain: Secondary | ICD-10-CM | POA: Diagnosis present

## 2022-01-28 DIAGNOSIS — N2581 Secondary hyperparathyroidism of renal origin: Secondary | ICD-10-CM | POA: Diagnosis present

## 2022-01-28 DIAGNOSIS — M5442 Lumbago with sciatica, left side: Secondary | ICD-10-CM | POA: Diagnosis present

## 2022-01-28 DIAGNOSIS — R1013 Epigastric pain: Principal | ICD-10-CM

## 2022-01-28 DIAGNOSIS — N186 End stage renal disease: Secondary | ICD-10-CM

## 2022-01-28 DIAGNOSIS — G8929 Other chronic pain: Secondary | ICD-10-CM | POA: Diagnosis present

## 2022-01-28 DIAGNOSIS — Z8614 Personal history of Methicillin resistant Staphylococcus aureus infection: Secondary | ICD-10-CM

## 2022-01-28 DIAGNOSIS — Z79899 Other long term (current) drug therapy: Secondary | ICD-10-CM

## 2022-01-28 DIAGNOSIS — E875 Hyperkalemia: Secondary | ICD-10-CM | POA: Diagnosis present

## 2022-01-28 DIAGNOSIS — Z7151 Drug abuse counseling and surveillance of drug abuser: Secondary | ICD-10-CM

## 2022-01-28 DIAGNOSIS — G2581 Restless legs syndrome: Secondary | ICD-10-CM | POA: Diagnosis present

## 2022-01-28 DIAGNOSIS — G2401 Drug induced subacute dyskinesia: Secondary | ICD-10-CM | POA: Diagnosis present

## 2022-01-28 DIAGNOSIS — Z992 Dependence on renal dialysis: Secondary | ICD-10-CM

## 2022-01-28 DIAGNOSIS — D631 Anemia in chronic kidney disease: Secondary | ICD-10-CM | POA: Diagnosis present

## 2022-01-28 DIAGNOSIS — Z8673 Personal history of transient ischemic attack (TIA), and cerebral infarction without residual deficits: Secondary | ICD-10-CM

## 2022-01-28 DIAGNOSIS — Z9079 Acquired absence of other genital organ(s): Secondary | ICD-10-CM

## 2022-01-28 DIAGNOSIS — Z90710 Acquired absence of both cervix and uterus: Secondary | ICD-10-CM

## 2022-01-28 DIAGNOSIS — Y712 Prosthetic and other implants, materials and accessory cardiovascular devices associated with adverse incidents: Secondary | ICD-10-CM | POA: Diagnosis present

## 2022-01-28 DIAGNOSIS — M898X9 Other specified disorders of bone, unspecified site: Secondary | ICD-10-CM | POA: Diagnosis present

## 2022-01-28 DIAGNOSIS — K838 Other specified diseases of biliary tract: Secondary | ICD-10-CM | POA: Diagnosis present

## 2022-01-28 DIAGNOSIS — Z885 Allergy status to narcotic agent status: Secondary | ICD-10-CM

## 2022-01-28 DIAGNOSIS — I132 Hypertensive heart and chronic kidney disease with heart failure and with stage 5 chronic kidney disease, or end stage renal disease: Secondary | ICD-10-CM | POA: Diagnosis present

## 2022-01-28 DIAGNOSIS — Z90722 Acquired absence of ovaries, bilateral: Secondary | ICD-10-CM

## 2022-01-28 DIAGNOSIS — F1010 Alcohol abuse, uncomplicated: Secondary | ICD-10-CM | POA: Diagnosis present

## 2022-01-28 DIAGNOSIS — M5441 Lumbago with sciatica, right side: Secondary | ICD-10-CM | POA: Diagnosis present

## 2022-01-28 LAB — CBC WITH DIFFERENTIAL/PLATELET
Abs Immature Granulocytes: 0.03 10*3/uL (ref 0.00–0.07)
Basophils Absolute: 0.1 10*3/uL (ref 0.0–0.1)
Basophils Relative: 1 %
Eosinophils Absolute: 0 10*3/uL (ref 0.0–0.5)
Eosinophils Relative: 1 %
HCT: 39.5 % (ref 36.0–46.0)
Hemoglobin: 12.5 g/dL (ref 12.0–15.0)
Immature Granulocytes: 0 %
Lymphocytes Relative: 30 %
Lymphs Abs: 2.2 10*3/uL (ref 0.7–4.0)
MCH: 26.8 pg (ref 26.0–34.0)
MCHC: 31.6 g/dL (ref 30.0–36.0)
MCV: 84.8 fL (ref 80.0–100.0)
Monocytes Absolute: 0.7 10*3/uL (ref 0.1–1.0)
Monocytes Relative: 9 %
Neutro Abs: 4.5 10*3/uL (ref 1.7–7.7)
Neutrophils Relative %: 59 %
Platelets: 272 10*3/uL (ref 150–400)
RBC: 4.66 MIL/uL (ref 3.87–5.11)
RDW: 19.4 % — ABNORMAL HIGH (ref 11.5–15.5)
WBC: 7.5 10*3/uL (ref 4.0–10.5)
nRBC: 0 % (ref 0.0–0.2)

## 2022-01-28 LAB — COMPREHENSIVE METABOLIC PANEL
ALT: 13 U/L (ref 0–44)
AST: 24 U/L (ref 15–41)
Albumin: 4 g/dL (ref 3.5–5.0)
Alkaline Phosphatase: 97 U/L (ref 38–126)
Anion gap: 16 — ABNORMAL HIGH (ref 5–15)
BUN: 7 mg/dL — ABNORMAL LOW (ref 8–23)
CO2: 25 mmol/L (ref 22–32)
Calcium: 9.4 mg/dL (ref 8.9–10.3)
Chloride: 98 mmol/L (ref 98–111)
Creatinine, Ser: 3.33 mg/dL — ABNORMAL HIGH (ref 0.44–1.00)
GFR, Estimated: 15 mL/min — ABNORMAL LOW (ref >60)
Glucose, Bld: 92 mg/dL (ref 70–99)
Potassium: 3.5 mmol/L (ref 3.5–5.1)
Sodium: 139 mmol/L (ref 135–145)
Total Bilirubin: 0.5 mg/dL (ref 0.3–1.2)
Total Protein: 7.8 g/dL (ref 6.5–8.1)

## 2022-01-28 LAB — LIPASE, BLOOD: Lipase: 41 U/L (ref 11–51)

## 2022-01-28 MED ORDER — LORAZEPAM 2 MG/ML IJ SOLN
1.0000 mg | Freq: Once | INTRAMUSCULAR | Status: AC
  Administered 2022-01-28: 1 mg via INTRAVENOUS
  Filled 2022-01-28: qty 1

## 2022-01-28 MED ORDER — IOHEXOL 300 MG/ML  SOLN
100.0000 mL | Freq: Once | INTRAMUSCULAR | Status: AC | PRN
  Administered 2022-01-28: 100 mL via INTRAVENOUS

## 2022-01-28 MED ORDER — HYDROMORPHONE HCL 1 MG/ML IJ SOLN
1.0000 mg | Freq: Once | INTRAMUSCULAR | Status: AC
  Administered 2022-01-28: 1 mg via INTRAVENOUS
  Filled 2022-01-28: qty 1

## 2022-01-28 MED ORDER — LORAZEPAM 2 MG/ML IJ SOLN
0.5000 mg | Freq: Once | INTRAMUSCULAR | Status: AC
  Administered 2022-01-28: 0.5 mg via INTRAVENOUS
  Filled 2022-01-28: qty 1

## 2022-01-28 MED ORDER — FENTANYL CITRATE PF 50 MCG/ML IJ SOSY
25.0000 ug | PREFILLED_SYRINGE | Freq: Once | INTRAMUSCULAR | Status: AC
  Administered 2022-01-28: 25 ug via INTRAVENOUS
  Filled 2022-01-28: qty 1

## 2022-01-28 NOTE — ED Provider Notes (Addendum)
[facility] EMERGENCY DEPARTMENT Provider Note   CSN: [id number] Arrival date & time: 01/28/22  1643     History  Chief Complaint  Patient presents with   Abdominal Pain    Sherry [name] is a 66 y.o. female with complex history including ESRD on dialysis presenting today with sudden onset of throbbing abdominal pain while at dialysis today.  Only had 5 minutes left of dialysis session, began having pain and was brought to the ED.  Given 50 mcg by EMS and began vomiting.  Denies fevers, chills, chest pain, shortness of breath, changes in urinary or bowel habits, flank pain, or back pain.  States she still makes urine daily, but is small amounts.  Has been on dialysis since since around October 2022.  Denies ever having pain like this before.  The history is provided by the patient and medical records.  Abdominal Pain      Home Medications Prior to Admission medications   Medication Sig Start Date End Date Taking? Authorizing Provider  albuterol (VENTOLIN HFA) 108 (90 Base) MCG/ACT inhaler Inhale 2 puffs into the lungs every 4 (four) hours as needed for wheezing or shortness of breath. 04/23/21   [provider]  BELBUCA 600 MCG FILM Take 1 Film by mouth 2 (two) times daily. 01/12/22   [provider]  FLUoxetine (PROZAC) 40 MG capsule Take 40 mg by mouth in the morning. 11/09/18   [provider]  gabapentin (NEURONTIN) 400 MG capsule Take 400 mg by mouth in the morning, at noon, and at bedtime. 12/28/21   [provider]  hydrALAZINE (APRESOLINE) 50 MG tablet Take 50 mg by mouth 3 (three) times daily. 03/24/21   [provider]  INGREZZA 80 MG CAPS Take 80 mg by mouth at bedtime. 10/15/17   [provider]  isosorbide dinitrate (ISORDIL) 20 MG tablet Take 20 mg by mouth 3 (three) times daily. 12/30/21   [provider]  labetalol (NORMODYNE) 300 MG tablet Take 300 mg by mouth 2 (two) times daily.    [provider]  lidocaine (LIDODERM) 5 % Place 1 patch onto the skin daily. Remove & Discard patch within 12 hours or as directed by MD Patient taking differently: Place 1 patch onto the skin daily as needed (back pain). Remove & Discard patch within 12 hours or as directed by MD 12/27/21   Marita Kansas, PA-C  Melatonin 10 MG TABS Take 10 mg by mouth at bedtime as needed (sleep).    [provider]  methocarbamol (ROBAXIN) 500 MG tablet Take 1 tablet (500 mg total) by mouth 2 (two) times daily. Patient not taking: Reported on 01/27/2022 12/27/21   Marita Kansas, PA-C  oxyCODONE-acetaminophen (PERCOCET/ROXICET) 5-325 MG tablet Take 1 tablet by mouth every 6 (six) hours as needed for severe pain. Patient not taking: Reported on 01/27/2022 12/27/21   Marita Kansas, PA-C  pantoprazole (PROTONIX) 40 MG tablet Take 40 mg by mouth daily. 04/05/21   [provider]  predniSONE (STERAPRED UNI-PAK 21 TAB) 10 MG (21) TBPK tablet Take by mouth daily. Take 6 tabs by mouth daily  for 2 days, then 5 tabs for 2 days, then 4 tabs for 2 days, then 3 tabs for 2 days, 2 tabs for 2 days, then 1 tab by mouth daily for 2 days Patient not taking: Reported on 01/09/2022 12/27/21   Marita Kansas, PA-C  traMADol (ULTRAM) 50 MG tablet Take 50-100 mg by mouth 3 (three) times daily as  needed for severe pain.    [provider]  vitamin C (ASCORBIC ACID) 500 MG tablet Take 500 mg by mouth daily.    [provider]      Allergies    Abilify [aripiprazole], Remeron [mirtazapine], Trazodone and nefazodone, Flexeril [cyclobenzaprine], and Amoxicillin    Review of Systems   Review of Systems  Gastrointestinal:  Positive for abdominal pain.    Physical Exam Updated Vital Signs BP 137/84 (BP Location: Left Arm)   Pulse 96   Temp 98.7 F (37.1 C) (Oral)   Resp 16   Ht 5\' 2"  (1.575 m)   SpO2 100%   BMI 34.02 kg/m  Physical Exam Vitals and nursing note reviewed.  Constitutional:      General: She is not  in acute distress.    Appearance: She is well-developed. She is ill-appearing. She is not diaphoretic.  HENT:     Head: Normocephalic and atraumatic.     Mouth/Throat:     Mouth: Mucous membranes are dry.     Pharynx: Oropharynx is clear.  Eyes:     Conjunctiva/sclera: Conjunctivae normal.  Cardiovascular:     Rate and Rhythm: Regular rhythm. Tachycardia present.     Heart sounds: No murmur heard. Pulmonary:     Effort: Pulmonary effort is normal. No respiratory distress.     Breath sounds: Normal breath sounds. No wheezing.  Chest:     Chest wall: No tenderness.  Abdominal:     Palpations: Abdomen is soft.     Tenderness: There is generalized abdominal tenderness (Diffuse). There is no right CVA tenderness, left CVA tenderness or rebound. Negative signs include Murphy's sign and McBurney's sign.  Musculoskeletal:        General: No swelling.     Cervical back: Neck supple.  Skin:    General: Skin is warm and dry.     Capillary Refill: Capillary refill takes less than 2 seconds.  Neurological:     Mental Status: She is alert and oriented to person, place, and time.     GCS: GCS eye subscore is 4. GCS verbal subscore is 5. GCS motor subscore is 6.     Cranial Nerves: No dysarthria or facial asymmetry.     Comments: Tardive Dyskinesia at baseline appreciated.  Psychiatric:        Mood and Affect: Mood normal.     ED Results / Procedures / Treatments   Labs (all labs ordered are listed, but only abnormal results are displayed) Labs Reviewed  CBC WITH DIFFERENTIAL/PLATELET - Abnormal; Notable for the following components:      Result Value   RDW 19.4 (*)    All other components within normal limits  COMPREHENSIVE METABOLIC PANEL - Abnormal; Notable for the following components:   BUN 7 (*)    Creatinine, Ser 3.33 (*)    GFR, Estimated 15 (*)    Anion gap 16 (*)    All other components within normal limits  LIPASE, BLOOD  URINALYSIS, ROUTINE W REFLEX MICROSCOPIC     EKG None  Radiology CT ABDOMEN PELVIS W CONTRAST  Result Date: 01/28/2022 CLINICAL DATA:  Nausea/vomiting. Abdominal pain, acute, nonlocalized EXAM: CT ABDOMEN AND PELVIS WITH CONTRAST TECHNIQUE: Multidetector CT imaging of the abdomen and pelvis was performed using the standard protocol following bolus administration of intravenous contrast. RADIATION DOSE REDUCTION: This exam was performed according to the departmental dose-optimization program which includes automated exposure control, adjustment of the mA and/or kV according to patient size and/or  use of iterative reconstruction technique. CONTRAST:  OMNIPAQUE IOHEXOL 300 MG/ML  SOLN COMPARISON:  12/22/2021 FINDINGS: Lower chest: Visualized lung bases are clear. Cardiac size within normal limits. Mild left ventricular hypertrophy noted. Central venous catheter tip noted within the superior right atrium. No pericardial effusion peer Hepatobiliary: Status post cholecystectomy. Mild intra and moderate extrahepatic biliary ductal dilation appears slightly progressive since prior examination with the extrahepatic bile duct measuring up to 16 mm in diameter. While this may represent post cholecystectomy change, distal obstructing process is not excluded on this limited examination. 9 mm hypodensity within the right hepatic lobe, axial image # 14/3 is indeterminate but may represent a tiny cyst or hemangioma in a patient without a history of malignancy. The liver is otherwise unremarkable. Pancreas: Unremarkable Spleen: Unremarkable Adrenals/Urinary Tract: Stable 21 mm left adrenal nodule better characterized as a benign adrenal adenoma on prior noncontrast CT examination of 11/18/2019. The kidneys are markedly atrophic bilaterally with numerous simple cysts seen bilaterally in keeping with cystic change of chronic renal failure. No follow-up imaging is recommended for these lesions. No definite enhancing intrarenal mass is identified. No  hydronephrosis. No intrarenal or ureteral calculi. The bladder is unremarkable. Stomach/Bowel: Mild sigmoid diverticulosis. Stomach, small bowel, and large bowel are otherwise unremarkable. Appendix normal. No free intraperitoneal gas or fluid. Vascular/Lymphatic: Moderate aortoiliac atherosclerotic calcification. No aortic aneurysm. No pathologic adenopathy within the abdomen and pelvis. Reproductive: Status post hysterectomy. No adnexal masses. Other: Umbilical hernia repair with mesh has been performed. There is diastasis of the rectus abdominus musculature with protuberance in the umbilical region without recurrent frank hernia. The rectum is unremarkable. Musculoskeletal: Advanced degenerative changes are noted within the right hip. L4-5 lumbar fusion with instrumentation has been performed no acute bone abnormality. No lytic or blastic bone lesion. IMPRESSION: 1. No acute intra-abdominal pathology identified. No definite radiographic explanation for the patient's reported symptoms. 2. Status post cholecystectomy. Progressive intra and extrahepatic biliary ductal dilation. While this may represent post cholecystectomy change, distal obstructing process is not excluded on this limited examination. Correlation with liver enzymes may be helpful for further evaluation. 3. Stable left adrenal adenoma. 4. Mild sigmoid diverticulosis. 5. Status post umbilical hernia repair with mesh. Diastasis of the rectus abdominus musculature with protuberance in the umbilical region without recurrent frank hernia. Aortic Atherosclerosis (ICD10-I70.0). Electronically Signed   By: Helyn Numbers M.D.   On: 01/28/2022 20:57    Procedures Procedures    Medications Ordered in ED Medications  LORazepam (ATIVAN) injection 1 mg (1 mg Intravenous Given 01/28/22 1806)  fentaNYL (SUBLIMAZE) injection 25 mcg (25 mcg Intravenous Given 01/28/22 1806)  LORazepam (ATIVAN) injection 0.5 mg (0.5 mg Intravenous Given 01/28/22 2007)   HYDROmorphone (DILAUDID) injection 1 mg (1 mg Intravenous Given 01/28/22 2007)  iohexol (OMNIPAQUE) 300 MG/ML solution 100 mL (100 mLs Intravenous Contrast Given 01/28/22 2030)    ED Course/ Medical Decision Making/ A&P                           Medical Decision Making Amount and/or Complexity of Data Reviewed Labs: ordered. Radiology: ordered.  Risk Prescription drug management. Decision regarding hospitalization.   65 y.o. female presents to the ED for concern of Abdominal Pain     This involves an extensive number of treatment options, and is a complaint that carries with it a high risk of complications and morbidity.     Past Medical History / Co-morbidities / Social History: Hx of  HTN, ESRD on dialysis, GERD, polysubstance abuse, severe alcohol use disorder, hepatitis C, anemia, CHF, bipolar, depression, anxiety, opioid dependence Prior surgical history of cholecystectomy, hysterectomy, and hernia repair Social Determinants of Health include polysubstance abuse, for which cessation counseling was provided  Additional History:  Internal and external records from outside source obtained and reviewed including ED visits  Lab Tests: I ordered, and personally interpreted labs.  The pertinent results include:   CBC: Unremarkable CMP/BMP: Creatinine 3.33, GFR 15, both close to baseline.  Potassium 3.5 normal Lipase: 41  Imaging Studies: I ordered imaging studies including CT abdomen pelvis and [place] RUQ.   I independently visualized and interpreted imaging which showed CT abdomen and pelvis suggest postcholecystectomy changes, further evaluation with [place] RUQ-pending I agree with the radiologist interpretation.  Cardiac Monitoring: The patient was maintained on a cardiac monitor.  I personally viewed and interpreted the cardiac monitored which showed an underlying rhythm of: Prolonged QTc 574, normal sinus rhythm with evidence of LVH  ED Course / Critical Interventions: Pt  well-appearing on exam.  Presenting with generalized abdominal pain.  Afebrile, nontoxic, nonseptic appearing in NAD.  Some tardive dyskinesia at baseline per previous records.  Pain managed in ED.  Still produces urine daily.  Without urinary symptoms, but still possible for UTI.  Without changes in bowel habits.  Scheduled for AV fistulagram tomorrow and pt inquires if she can stay the night for the procedure tomorrow.  I personally reviewed labs.  No evidence of leukocytosis.  Does not appear fluid overloaded.  Potassium 3.5, normal.  Do not believe pt requires urgent dialysis based on labs and presentation.  Lipase unremarkable, lower initial suspicion for acute pancreatitis and clinical picture not strongly suggestive.   Due to prolonged Qtc, provided ativan for nausea relief, zofran held. Upon reevaluation, patient has some improvement with ativan, however is still complaining of generalized abdominal pain.  Dilaudid and another dose Ativan provided.  CT abdomen pelvis suggestive of possible postcholecystectomy changes.  Plan to further evaluate with RUQ [place].  UA still pending, plan to assess for possible UTI. RUQ [place] and UA pending.  Disposition: 0015 care of Sherry [name] transferred to [facility]  at the end of my shift  Patient course discussed at length.  Please see his/her note for further details.  Plan at time of handoff is dependent on ultrasound and urinalysis results.  If urinalysis indicative of UTI and patient remains nonseptic, plan to treat with antibiotics and likely close urology/PCP follow-up.  If ultrasound indicates acute RUQ abdominal pathology, plan to treat accordingly.  May consider consultation with GI for further recommendations as pain appears to be out of proportion on exam.  This may be altered or completely changed at the discretion of the oncoming team pending results of further workup.   I discussed this case with my attending, Dr. Rubin Payor, who agreed with the  proposed treatment course and cosigned this note including patient's presenting symptoms, physical exam, and planned diagnostics and interventions.  Attending physician stated agreement with plan or made changes to plan which were implemented.     This chart was dictated using voice recognition software.  Despite best efforts to proofread, errors can occur which can change the documentation meaning.         Final Clinical Impression(s) / ED Diagnoses Final diagnoses:  None    Rx / DC Orders ED Discharge Orders     None         Cecil Cobbs,  PA-C 02/03/22 1242    Cecil Cobbs, PA-C 02/03/22 1245    Benjiman Core, MD 02/04/22 1601

## 2022-01-28 NOTE — ED Triage Notes (Signed)
Patient bib [facility] from dialysis. Patient completed all of dialysis but 5 mins. Patient present swith severe abdominal pain all over. Patient is a&o. EMS gave patient 50 of fentanyl IM and patient began to vomit.   VSS 160/92, HR 105 98%RA 105BG

## 2022-01-28 NOTE — ED Notes (Signed)
Patient transported to CT 

## 2022-01-29 ENCOUNTER — Ambulatory Visit (HOSPITAL_COMMUNITY): Admission: RE | Admit: 2022-01-29 | Payer: Medicare Other | Source: Home / Self Care | Admitting: Vascular Surgery

## 2022-01-29 ENCOUNTER — Encounter (HOSPITAL_COMMUNITY): Payer: Self-pay | Admitting: Internal Medicine

## 2022-01-29 ENCOUNTER — Inpatient Hospital Stay (HOSPITAL_COMMUNITY)
Admission: EM | Disposition: A | Discharge status: Home / Self Care | Payer: Self-pay | Source: Home / Self Care | Attending: Family Medicine

## 2022-01-29 ENCOUNTER — Observation Stay (HOSPITAL_COMMUNITY): Payer: Medicare Other

## 2022-01-29 DIAGNOSIS — I5032 Chronic diastolic (congestive) heart failure: Secondary | ICD-10-CM | POA: Diagnosis present

## 2022-01-29 DIAGNOSIS — T82510A Breakdown (mechanical) of surgically created arteriovenous fistula, initial encounter: Secondary | ICD-10-CM | POA: Diagnosis present

## 2022-01-29 DIAGNOSIS — G8929 Other chronic pain: Secondary | ICD-10-CM | POA: Diagnosis present

## 2022-01-29 DIAGNOSIS — F1120 Opioid dependence, uncomplicated: Secondary | ICD-10-CM | POA: Diagnosis present

## 2022-01-29 DIAGNOSIS — N185 Chronic kidney disease, stage 5: Secondary | ICD-10-CM | POA: Diagnosis not present

## 2022-01-29 DIAGNOSIS — R1013 Epigastric pain: Secondary | ICD-10-CM

## 2022-01-29 DIAGNOSIS — M898X9 Other specified disorders of bone, unspecified site: Secondary | ICD-10-CM | POA: Diagnosis present

## 2022-01-29 DIAGNOSIS — G2581 Restless legs syndrome: Secondary | ICD-10-CM | POA: Diagnosis present

## 2022-01-29 DIAGNOSIS — M5441 Lumbago with sciatica, right side: Secondary | ICD-10-CM | POA: Diagnosis present

## 2022-01-29 DIAGNOSIS — Z8659 Personal history of other mental and behavioral disorders: Secondary | ICD-10-CM

## 2022-01-29 DIAGNOSIS — K219 Gastro-esophageal reflux disease without esophagitis: Secondary | ICD-10-CM | POA: Diagnosis present

## 2022-01-29 DIAGNOSIS — T82898A Other specified complication of vascular prosthetic devices, implants and grafts, initial encounter: Secondary | ICD-10-CM | POA: Diagnosis not present

## 2022-01-29 DIAGNOSIS — F319 Bipolar disorder, unspecified: Secondary | ICD-10-CM | POA: Diagnosis present

## 2022-01-29 DIAGNOSIS — R109 Unspecified abdominal pain: Secondary | ICD-10-CM | POA: Diagnosis present

## 2022-01-29 DIAGNOSIS — N186 End stage renal disease: Secondary | ICD-10-CM | POA: Diagnosis present

## 2022-01-29 DIAGNOSIS — D631 Anemia in chronic kidney disease: Secondary | ICD-10-CM | POA: Diagnosis present

## 2022-01-29 DIAGNOSIS — R1084 Generalized abdominal pain: Secondary | ICD-10-CM | POA: Diagnosis not present

## 2022-01-29 DIAGNOSIS — T82590A Other mechanical complication of surgically created arteriovenous fistula, initial encounter: Secondary | ICD-10-CM | POA: Diagnosis not present

## 2022-01-29 DIAGNOSIS — E875 Hyperkalemia: Secondary | ICD-10-CM | POA: Diagnosis present

## 2022-01-29 DIAGNOSIS — G2401 Drug induced subacute dyskinesia: Secondary | ICD-10-CM | POA: Diagnosis present

## 2022-01-29 DIAGNOSIS — Z853 Personal history of malignant neoplasm of breast: Secondary | ICD-10-CM | POA: Diagnosis not present

## 2022-01-29 DIAGNOSIS — F1910 Other psychoactive substance abuse, uncomplicated: Secondary | ICD-10-CM

## 2022-01-29 DIAGNOSIS — K838 Other specified diseases of biliary tract: Secondary | ICD-10-CM | POA: Diagnosis present

## 2022-01-29 DIAGNOSIS — Y712 Prosthetic and other implants, materials and accessory cardiovascular devices associated with adverse incidents: Secondary | ICD-10-CM | POA: Diagnosis present

## 2022-01-29 DIAGNOSIS — Z992 Dependence on renal dialysis: Secondary | ICD-10-CM | POA: Diagnosis not present

## 2022-01-29 DIAGNOSIS — Z9049 Acquired absence of other specified parts of digestive tract: Secondary | ICD-10-CM | POA: Diagnosis not present

## 2022-01-29 DIAGNOSIS — F1010 Alcohol abuse, uncomplicated: Secondary | ICD-10-CM | POA: Diagnosis present

## 2022-01-29 DIAGNOSIS — N2581 Secondary hyperparathyroidism of renal origin: Secondary | ICD-10-CM | POA: Diagnosis present

## 2022-01-29 DIAGNOSIS — F17210 Nicotine dependence, cigarettes, uncomplicated: Secondary | ICD-10-CM | POA: Diagnosis present

## 2022-01-29 DIAGNOSIS — F419 Anxiety disorder, unspecified: Secondary | ICD-10-CM | POA: Diagnosis present

## 2022-01-29 DIAGNOSIS — M5442 Lumbago with sciatica, left side: Secondary | ICD-10-CM | POA: Diagnosis present

## 2022-01-29 DIAGNOSIS — I132 Hypertensive heart and chronic kidney disease with heart failure and with stage 5 chronic kidney disease, or end stage renal disease: Secondary | ICD-10-CM | POA: Diagnosis present

## 2022-01-29 HISTORY — PX: A/V FISTULAGRAM: CATH118298

## 2022-01-29 HISTORY — PX: PERIPHERAL VASCULAR BALLOON ANGIOPLASTY: CATH118281

## 2022-01-29 LAB — HEPATITIS B SURFACE ANTIBODY,QUALITATIVE: Hep B S Ab: REACTIVE — AB

## 2022-01-29 LAB — HEPATITIS C ANTIBODY: HCV Ab: REACTIVE — AB

## 2022-01-29 LAB — HEPATITIS B CORE ANTIBODY, TOTAL: Hep B Core Total Ab: NONREACTIVE

## 2022-01-29 LAB — HEPATITIS B SURFACE ANTIGEN: Hepatitis B Surface Ag: NONREACTIVE

## 2022-01-29 SURGERY — A/V FISTULAGRAM
Anesthesia: LOCAL | Laterality: Right

## 2022-01-29 MED ORDER — MELATONIN 5 MG PO TABS
10.0000 mg | ORAL_TABLET | Freq: Every evening | ORAL | Status: DC | PRN
Start: 2022-01-29 — End: 2022-02-01
  Administered 2022-01-29: 10 mg via ORAL
  Filled 2022-01-29: qty 2

## 2022-01-29 MED ORDER — SORBITOL 70 % SOLN: 30.0000 mL | Status: DC | PRN

## 2022-01-29 MED ORDER — LABETALOL HCL 200 MG PO TABS
200.0000 mg | ORAL_TABLET | Freq: Two times a day (BID) | ORAL | Status: DC
Start: 2022-01-29 — End: 2022-01-31
  Administered 2022-01-29 – 2022-01-30 (×3): 200 mg via ORAL
  Filled 2022-01-29 (×4): qty 1

## 2022-01-29 MED ORDER — PANTOPRAZOLE SODIUM 40 MG IV SOLR
40.0000 mg | INTRAVENOUS | Status: DC
  Administered 2022-01-29 – 2022-01-30 (×2): 40 mg via INTRAVENOUS
  Filled 2022-01-29 (×2): qty 10

## 2022-01-29 MED ORDER — ONDANSETRON HCL 4 MG/2ML IJ SOLN
4.0000 mg | Freq: Four times a day (QID) | INTRAMUSCULAR | Status: DC | PRN
  Administered 2022-01-30: 4 mg via INTRAVENOUS
  Filled 2022-01-29: qty 2

## 2022-01-29 MED ORDER — ALBUTEROL SULFATE HFA 108 (90 BASE) MCG/ACT IN AERS
2.0000 | INHALATION_SPRAY | RESPIRATORY_TRACT | Status: DC | PRN
Start: 2022-01-29 — End: 2022-01-29

## 2022-01-29 MED ORDER — NICOTINE 14 MG/24HR TD PT24
14.0000 mg | MEDICATED_PATCH | Freq: Every day | TRANSDERMAL | Status: DC | PRN
  Administered 2022-01-30: 14 mg via TRANSDERMAL
  Filled 2022-01-29: qty 1

## 2022-01-29 MED ORDER — HYDRALAZINE HCL 20 MG/ML IJ SOLN: 5.0000 mg | INTRAMUSCULAR | Status: DC | PRN

## 2022-01-29 MED ORDER — FENTANYL CITRATE PF 50 MCG/ML IJ SOSY
25.0000 ug | PREFILLED_SYRINGE | INTRAMUSCULAR | Status: DC | PRN
  Administered 2022-01-29 (×2): 25 ug via INTRAVENOUS
  Filled 2022-01-29 (×3): qty 1

## 2022-01-29 MED ORDER — ACETAMINOPHEN 325 MG PO TABS: 650.0000 mg | ORAL_TABLET | Freq: Four times a day (QID) | ORAL | Status: DC | PRN

## 2022-01-29 MED ORDER — ALBUTEROL SULFATE (2.5 MG/3ML) 0.083% IN NEBU: 2.5000 mg | INHALATION_SOLUTION | RESPIRATORY_TRACT | Status: DC | PRN

## 2022-01-29 MED ORDER — LIDOCAINE HCL (PF) 1 % IJ SOLN
5.0000 mL | INTRAMUSCULAR | Status: DC | PRN
Start: 2022-01-29 — End: 2022-01-30

## 2022-01-29 MED ORDER — LORAZEPAM 2 MG/ML IJ SOLN
1.0000 mg | Freq: Once | INTRAMUSCULAR | Status: AC | PRN
Start: 2022-01-29 — End: 2022-01-29
  Administered 2022-01-29: 1 mg via INTRAVENOUS
  Filled 2022-01-29: qty 1

## 2022-01-29 MED ORDER — BUPRENORPHINE HCL 600 MCG BU FILM
1.0000 | ORAL_FILM | Freq: Two times a day (BID) | BUCCAL | Status: DC
Start: 2022-01-29 — End: 2022-01-30

## 2022-01-29 MED ORDER — VALBENAZINE TOSYLATE 40 MG PO CAPS
80.0000 mg | ORAL_CAPSULE | Freq: Every day | ORAL | Status: DC
  Administered 2022-01-29 – 2022-01-31 (×3): 80 mg via ORAL
  Filled 2022-01-29 (×4): qty 2

## 2022-01-29 MED ORDER — CHLORHEXIDINE GLUCONATE CLOTH 2 % EX PADS
6.0000 | MEDICATED_PAD | Freq: Every day | CUTANEOUS | Status: DC
  Administered 2022-01-30 – 2022-02-01 (×3): 6 via TOPICAL

## 2022-01-29 MED ORDER — ZOLPIDEM TARTRATE 5 MG PO TABS
5.0000 mg | ORAL_TABLET | Freq: Every day | ORAL | Status: DC
  Administered 2022-01-29 – 2022-01-30 (×2): 5 mg via ORAL
  Filled 2022-01-29 (×3): qty 1

## 2022-01-29 MED ORDER — HYDRALAZINE HCL 50 MG PO TABS
50.0000 mg | ORAL_TABLET | Freq: Three times a day (TID) | ORAL | Status: DC
  Administered 2022-01-29 – 2022-02-01 (×7): 50 mg via ORAL
  Filled 2022-01-29 (×9): qty 1

## 2022-01-29 MED ORDER — DOCUSATE SODIUM 283 MG RE ENEM: 1.0000 | ENEMA | RECTAL | Status: DC | PRN

## 2022-01-29 MED ORDER — CHLORHEXIDINE GLUCONATE CLOTH 2 % EX PADS
6.0000 | MEDICATED_PAD | Freq: Every day | CUTANEOUS | Status: DC
Start: 2022-01-29 — End: 2022-01-29
  Administered 2022-01-29: 6 via TOPICAL

## 2022-01-29 MED ORDER — ONDANSETRON HCL 4 MG PO TABS: 4.0000 mg | ORAL_TABLET | Freq: Four times a day (QID) | ORAL | Status: DC | PRN

## 2022-01-29 MED ORDER — ACETAMINOPHEN 650 MG RE SUPP: 650.0000 mg | Freq: Four times a day (QID) | RECTAL | Status: DC | PRN

## 2022-01-29 MED ORDER — HEPARIN (PORCINE) IN NACL 1000-0.9 UT/500ML-% IV SOLN
INTRAVENOUS | Status: AC
  Filled 2022-01-29: qty 500

## 2022-01-29 MED ORDER — LIDOCAINE-PRILOCAINE 2.5-2.5 % EX CREA: 1.0000 | TOPICAL_CREAM | CUTANEOUS | Status: DC | PRN

## 2022-01-29 MED ORDER — ZOLPIDEM TARTRATE 5 MG PO TABS
5.0000 mg | ORAL_TABLET | Freq: Every evening | ORAL | Status: DC | PRN
  Administered 2022-01-31: 5 mg via ORAL

## 2022-01-29 MED ORDER — FLUOXETINE HCL 20 MG PO CAPS
40.0000 mg | ORAL_CAPSULE | Freq: Every morning | ORAL | Status: DC
Start: 2022-01-29 — End: 2022-02-01
  Administered 2022-01-29 – 2022-02-01 (×4): 40 mg via ORAL
  Filled 2022-01-29 (×4): qty 2

## 2022-01-29 MED ORDER — TRAMADOL HCL 50 MG PO TABS
50.0000 mg | ORAL_TABLET | Freq: Three times a day (TID) | ORAL | Status: DC | PRN
  Administered 2022-01-29 – 2022-02-01 (×5): 100 mg via ORAL
  Filled 2022-01-29 (×5): qty 2

## 2022-01-29 MED ORDER — ALTEPLASE 2 MG IJ SOLR: 2.0000 mg | Freq: Once | INTRAMUSCULAR | Status: DC | PRN

## 2022-01-29 MED ORDER — GABAPENTIN 400 MG PO CAPS
400.0000 mg | ORAL_CAPSULE | Freq: Three times a day (TID) | ORAL | Status: DC
  Administered 2022-01-29 – 2022-02-01 (×9): 400 mg via ORAL
  Filled 2022-01-29 (×9): qty 1

## 2022-01-29 MED ORDER — HEPARIN (PORCINE) IN NACL 1000-0.9 UT/500ML-% IV SOLN
INTRAVENOUS | Status: DC | PRN
  Administered 2022-01-29: 500 mL

## 2022-01-29 MED ORDER — PENTAFLUOROPROP-TETRAFLUOROETH EX AERO: 1.0000 | INHALATION_SPRAY | CUTANEOUS | Status: DC | PRN

## 2022-01-29 MED ORDER — ISOSORBIDE DINITRATE 10 MG PO TABS
20.0000 mg | ORAL_TABLET | Freq: Three times a day (TID) | ORAL | Status: DC
Start: 2022-01-29 — End: 2022-02-01
  Administered 2022-01-29 – 2022-02-01 (×9): 20 mg via ORAL
  Filled 2022-01-29 (×9): qty 2

## 2022-01-29 MED ORDER — CAMPHOR-MENTHOL 0.5-0.5 % EX LOTN: 1.0000 | TOPICAL_LOTION | Freq: Three times a day (TID) | CUTANEOUS | Status: DC | PRN

## 2022-01-29 MED ORDER — LIDOCAINE HCL (PF) 1 % IJ SOLN
INTRAMUSCULAR | Status: DC | PRN
  Administered 2022-01-29: 2 mL via INTRADERMAL

## 2022-01-29 MED ORDER — LORAZEPAM 2 MG/ML IJ SOLN
1.0000 mg | Freq: Once | INTRAMUSCULAR | Status: DC | PRN
Start: 2022-01-29 — End: 2022-02-01

## 2022-01-29 MED ORDER — IODIXANOL 320 MG/ML IV SOLN
INTRAVENOUS | Status: DC | PRN
  Administered 2022-01-29: 65 mL

## 2022-01-29 MED ORDER — HEPARIN SODIUM (PORCINE) 1000 UNIT/ML IJ SOLN
INTRAMUSCULAR | Status: AC
  Filled 2022-01-29: qty 10

## 2022-01-29 MED ORDER — HEPARIN SODIUM (PORCINE) 1000 UNIT/ML DIALYSIS
1000.0000 [IU] | INTRAMUSCULAR | Status: DC | PRN
Start: 2022-01-29 — End: 2022-01-30
  Filled 2022-01-29 (×3): qty 1

## 2022-01-29 MED ORDER — LIDOCAINE HCL (PF) 1 % IJ SOLN
INTRAMUSCULAR | Status: AC
  Filled 2022-01-29: qty 30

## 2022-01-29 MED ORDER — HYDROXYZINE HCL 25 MG PO TABS: 25.0000 mg | ORAL_TABLET | Freq: Three times a day (TID) | ORAL | Status: DC | PRN

## 2022-01-29 MED ORDER — CALCIUM CARBONATE ANTACID 1250 MG/5ML PO SUSP: 500.0000 mg | Freq: Four times a day (QID) | ORAL | Status: DC | PRN

## 2022-01-29 MED ORDER — LABETALOL HCL 200 MG PO TABS
300.0000 mg | ORAL_TABLET | Freq: Two times a day (BID) | ORAL | Status: DC
  Administered 2022-01-29: 300 mg via ORAL
  Filled 2022-01-29: qty 2

## 2022-01-29 MED ORDER — HYDROMORPHONE HCL 1 MG/ML IJ SOLN
0.5000 mg | Freq: Once | INTRAMUSCULAR | Status: AC
  Administered 2022-01-29: 0.5 mg via INTRAVENOUS
  Filled 2022-01-29: qty 1

## 2022-01-29 SURGICAL SUPPLY — 18 items
BAG SNAP BAND KOVER 36X36 (MISCELLANEOUS) ×2 IMPLANT
BALLN MUSTANG 5X80X75 (BALLOONS) ×2
BALLN MUSTANG 6.0X100X75 (BALLOONS) ×2
BALLN MUSTANG 7X80X75 (BALLOONS) ×2
BALLOON MUSTANG 5X80X75 (BALLOONS) IMPLANT
BALLOON MUSTANG 6.0X100X75 (BALLOONS) IMPLANT
BALLOON MUSTANG 7X80X75 (BALLOONS) IMPLANT
COVER DOME SNAP 22 D (MISCELLANEOUS) ×2 IMPLANT
KIT ENCORE 26 ADVANTAGE (KITS) ×1 IMPLANT
KIT MICROPUNCTURE NIT STIFF (SHEATH) ×1 IMPLANT
PROTECTION STATION PRESSURIZED (MISCELLANEOUS) ×2
SHEATH PINNACLE R/O II 6F 4CM (SHEATH) ×1 IMPLANT
SHEATH PROBE COVER 6X72 (BAG) ×3 IMPLANT
STATION PROTECTION PRESSURIZED (MISCELLANEOUS) ×1 IMPLANT
STOPCOCK MORSE 400PSI 3WAY (MISCELLANEOUS) ×2 IMPLANT
TRAY PV CATH (CUSTOM PROCEDURE TRAY) ×2 IMPLANT
TUBING CIL FLEX 10 FLL-RA (TUBING) ×2 IMPLANT
WIRE BENTSON .035X145CM (WIRE) ×1 IMPLANT

## 2022-01-29 NOTE — H&P (Signed)
History and Physical    Patient: Sherry [name] JYN:[id number] DOB: 1956-05-07 DOA: 01/28/2022 DOS: the patient was seen and examined on 01/29/2022 PCP: Wynelle Fanny, DO  Patient coming from: Home - lives alone; NOK: "None"   Chief Complaint: Abdominal pain  HPI: Sherry [name] is a 66 y.o. female with medical history significant of severe ETOH/polysubstance dependence; bipolar d/o; remote breast cancer; chronic back pain; chronic diastolic CHF; ESRD on HD; HTN; Hep C; tardive dyskinesia; and RLS presenting with abdominal pain.  She reports midepigastric abdominal pain that started during HD yesterday.  No n/v.  No fever.  The pain is ongoing.  She was due for a fistulogram today with Dr. Carlis Abbott.    ER Course:  Carryover, per Dr. Marlowe Sax:  66 year old female with ESRD on HD MWF, alcohol abuse/polysubstance abuse, history of cholecystectomy presented with epigastric abdominal pain after dialysis.  No fever or leukocytosis.  Lipase and LFTs normal.  CT showing progressive intra and extra hepatic biliary dilatation.  Ultrasound showing dilated common bile duct 12 mm but no stones.  Will need MRCP for further evaluation.  ED PA consulted [facility] Dr. Ardis Hughs via secure chat.  Also reportedly patient is supposed to get a right arm new AV fistula today by Dr. Carlis Abbott and vascular will have to be involved.     Review of Systems: As mentioned in the history of present illness. All other systems reviewed and are negative. Past Medical History:  Diagnosis Date   Alcohol use disorder, severe, dependence ([place]) 08/27/2017   Anxiety    Body mass index (BMI) 40.0-44.9, adult ([place]) 07/19/2020   Breast cancer ([place])    right lumpectemy and lymph node    Cervical spondylosis 05/24/2020   Chronic bilateral low back pain with right-sided sciatica 05/24/2020   Chronic diastolic CHF (congestive heart failure) ([place]) 05/09/2019   Cigarette smoker 12/31/2017   ESRD (end stage renal disease) on dialysis ([place])  08/22/2021   M-W-F   Essential hypertension 07/06/2017   GERD (gastroesophageal reflux disease)    Hepatitis C virus infection cured after antiviral drug therapy 12/17/2012   Telephone Encounter - Dub Mikes, RN - 12/28/2016 9:54 AM EDT Patient called for HCV RNA test results. EOT 08-26-16 - not detected. 12 week post treatment 12-11-16 - not detected. Informed patient that she was cured. Patient verbalized understanding.          Hx of bipolar disorder 01/31/2016   Impaired functional mobility, balance, gait, and endurance 01/09/2021   MRSA infection    of breast incision   Neuroleptic-induced tardive dyskinesia 09/06/2017   Abilify   Opioid use disorder, severe, dependence ([place]) 08/27/2017   Restless legs syndrome 02/13/2013   Formatting of this note might be different from the original. IMPRESSION: Possible. Will follow.   Subacute dyskinesia due to drug 02/13/2013   Formatting of this note might be different from the original. STORY: Tardive dyskinesia and mild limb dyskinesia, LE>UE which likely due to prolonged antipsychotic used, abilify. Couldn't tolerate artane, gabapentin, clonazepam and xenazine.`E1o3L`IMPRESSION: Well tolerated depakote '250mg'$  bid, mood aspect is better as well. Will increase to '500mg'$  bid. ADR was discussed.  RTC 4-6 weeks.   TIA (transient ischemic attack) 2020   P/w BP >230/120 and neurologic symptoms, presumed TIA   Past Surgical History:  Procedure Laterality Date   AV FISTULA PLACEMENT Right 05/01/2021   Procedure: RIGHT ARTERIOVENOUS (AV) FISTULA CREATION;  Surgeon: Broadus John, MD;  Location: [facility] OR;  Service: Vascular;  Laterality: Right;  PERIPHERAL  NERVE BLOCK   BACK SURGERY  7564   BASCILIC VEIN TRANSPOSITION Right 09/02/2021   Procedure: RIGHT SECOND STAGE BASILIC VEIN TRANSPOSITION;  Surgeon: Broadus John, MD;  Location: [facility];  Service: Vascular;  Laterality: Right;  PERIPHERAL NERVE BLOCK   BREAST SURGERY Right 2010   "breast cancer  survivor" - states partial mastectomy and nodes   COLONOSCOPY     [facility]   ESOPHAGOGASTRODUODENOSCOPY     [facility]   ESOPHAGOGASTRODUODENOSCOPY  04/18/2020   [facility]   LAPAROSCOPIC CHOLECYSTECTOMY  2002   LAPAROSCOPIC INCISIONAL / UMBILICAL / VENTRAL HERNIA REPAIR  04/13/2018   with BARD 15x 33IR mesh (supraumbilical)   LAPAROSCOPIC LYSIS OF ADHESIONS  07/12/2018   Procedure: LAPAROSCOPIC LYSIS OF ADHESIONS;  Surgeon: Isabel Caprice, MD;  Location: [facility] ORS;  Service: Gynecology;;   MULTIPLE TOOTH EXTRACTIONS     MYOMECTOMY     x 2 prior to hysterectomy   ROBOTIC ASSISTED BILATERAL SALPINGO OOPHERECTOMY Right 07/12/2018   Procedure: XI ROBOTIC ASSISTED RIGHT SALPINGO OOPHORECTOMY;  Surgeon: Isabel Caprice, MD;  Location: [facility] ORS;  Service: Gynecology;  Laterality: Right;   TOTAL ABDOMINAL HYSTERECTOMY     fibroids    UPPER GASTROINTESTINAL ENDOSCOPY     WISDOM TOOTH EXTRACTION     Social History:  reports that she has been smoking cigarettes. She has been smoking an average of .5 packs per day. She has never been exposed to tobacco smoke. She has never used smokeless tobacco. She reports that she does not currently use alcohol. She reports that she does not currently use drugs.  Allergies  Allergen Reactions   Abilify [Aripiprazole] Other (See Comments)    Tardive dyskinesia Oral   Remeron [Mirtazapine] Other (See Comments)    Wgt stimulation /gain, Dizziness, Patient says "can tolerate"   Trazodone And Nefazodone Other (See Comments)    Nightmares/sleep diturbance   Flexeril [Cyclobenzaprine] Other (See Comments)    Pt states Flexeril makes her feel depressed    Amoxicillin Diarrhea and Other (See Comments)    NOTE the patient has had PCN WITHOUT reaction Has patient had a PCN reaction causing immediate rash, facial/tongue/throat swelling, SOB or lightheadedness with hypotension: No Has patient had a PCN reaction causing severe rash involving mucus  membranes or skin necrosis: No Has patient had a PCN reaction that required hospitalization: No Has patient had a PCN reaction occurring within the last 10 years: No If all of the above answers are "NO", then may proceed with Cephalosporin use.     Family History  Problem Relation Age of Onset   Heart attack Mother    Breast cancer Mother 58   Dementia Mother    Cancer Father 36       died of bleeding from kidneys   Colon cancer Neg Hx    Esophageal cancer Neg Hx    Stomach cancer Neg Hx    Rectal cancer Neg Hx     Prior to Admission medications   Medication Sig Start Date End Date Taking? Authorizing Provider  albuterol (VENTOLIN HFA) 108 (90 Base) MCG/ACT inhaler Inhale 2 puffs into the lungs every 4 (four) hours as needed for wheezing or shortness of breath. 04/23/21   [provider]  BELBUCA 600 MCG FILM Take 1 Film by mouth 2 (two) times daily. 01/12/22   [provider]  FLUoxetine (PROZAC) 40 MG capsule Take 40 mg by mouth in the morning. 11/09/18   [provider]  gabapentin (NEURONTIN) 400 MG capsule Take 400 mg by mouth in the morning, at noon, and at bedtime. 12/28/21   [provider]  hydrALAZINE (APRESOLINE) 50 MG tablet Take 50 mg by mouth 3 (three) times daily. 03/24/21   [provider]  INGREZZA 80 MG CAPS Take 80 mg by mouth at bedtime. 10/15/17   [provider]  isosorbide dinitrate (ISORDIL) 20 MG tablet Take 20 mg by mouth 3 (three) times daily. 12/30/21   [provider]  labetalol (NORMODYNE) 300 MG tablet Take 300 mg by mouth 2 (two) times daily.    [provider]  lidocaine (LIDODERM) 5 % Place 1 patch onto the skin daily. Remove & Discard patch within 12 hours or as directed by MD Patient taking differently: Place 1 patch onto the skin daily as needed (back pain). Remove & Discard patch within 12 hours or as directed by MD 12/27/21   Evlyn Courier, PA-C  Melatonin 10 MG TABS Take 10 mg by mouth  at bedtime as needed (sleep).    [provider]  methocarbamol (ROBAXIN) 500 MG tablet Take 1 tablet (500 mg total) by mouth 2 (two) times daily. Patient not taking: Reported on 01/27/2022 12/27/21   Evlyn Courier, PA-C  oxyCODONE-acetaminophen (PERCOCET/ROXICET) 5-325 MG tablet Take 1 tablet by mouth every 6 (six) hours as needed for severe pain. Patient not taking: Reported on 01/27/2022 12/27/21   Evlyn Courier, PA-C  pantoprazole (PROTONIX) 40 MG tablet Take 40 mg by mouth daily. 04/05/21   [provider]  predniSONE (STERAPRED UNI-PAK 21 TAB) 10 MG (21) TBPK tablet Take by mouth daily. Take 6 tabs by mouth daily  for 2 days, then 5 tabs for 2 days, then 4 tabs for 2 days, then 3 tabs for 2 days, 2 tabs for 2 days, then 1 tab by mouth daily for 2 days Patient not taking: Reported on 01/09/2022 12/27/21   Evlyn Courier, PA-C  traMADol (ULTRAM) 50 MG tablet Take 50-100 mg by mouth 3 (three) times daily as needed for severe pain.    [provider]  vitamin C (ASCORBIC ACID) 500 MG tablet Take 500 mg by mouth daily.    [provider]    Physical Exam: Vitals:   01/29/22 1319 01/29/22 1324 01/29/22 1329 01/29/22 1344  BP: (!) 145/103 (!) 161/104 (!) 161/104 (!) 165/61  Pulse: 91 94 90 87  Resp: (!) '24 20  20  '$ Temp:    97.6 F (36.4 C)  TempSrc:    Oral  SpO2: 98% 99%  100%  Height:       General:  Appears calm and comfortable and is in NAD Eyes:   EOMI, normal lids, iris ENT:  grossly normal hearing, lips & tongue, mmm; artifical upper dentition; +tardive Neck:  no LAD, masses or thyromegaly Cardiovascular:  RRR, no m/r/g. No LE edema.  R TDC in place Respiratory:   CTA bilaterally with no wheezes/rales/rhonchi.  Normal respiratory effort. Abdomen:  soft, +midepigastric TTP, ND Skin:  no rash or induration seen on limited exam Musculoskeletal:  grossly normal tone BUE/BLE, good ROM, no bony abnormality Psychiatric:  altered mood and affect, speech fluent and  generally appropriate Neurologic:  CN 2-12 grossly intact with tardive dyskinesia, moves all extremities in coordinated fashion   Radiological Exams on Admission: Independently reviewed - see discussion in A/P where applicable  PERIPHERAL VASCULAR CATHETERIZATION  Result Date: 01/29/2022  OPERATIVE NOTE   PROCEDURE: right brachiobasilic arteriovenous fistula cannulation under ultrasound guidance  right arm fistulogram including central venogram right basilic vein angioplasty in the mid upper arm (5 mm x 80 mm Mustang, 6 mm x 100 mm Mustang, and 7 mm x 80 mm Mustang)  PRE-OPERATIVE DIAGNOSIS: Malfunctioning right arm arteriovenous fistula  POST-OPERATIVE DIAGNOSIS: same as above  SURGEON: Marty Heck, MD  ANESTHESIA: local  ESTIMATED BLOOD LOSS: 5 cc  FINDING(S): Difficult to visualize central venous system given catheter and poor compliance of patient on the table.  The mid upper arm basilic vein had two stenotic areas one greater than 99% and the other well over 70%.  Both of these were crossed with antegrade access and angioplastied with a 5 mm, 6 mm and 7 mm angioplasty balloon.  Patient had a pulsatile fistula at the start of the case and now has an excellent thrill.  Excellent results and fistula is available for use immediately.  SPECIMEN(S):  None  CONTRAST: 65 mL  INDICATIONS: Danyal Adorno is a 66 y.o. female who presents with malfunctioning right brachiobasilic arteriovenous fistula.  The patient is scheduled for right arm fistuloram.  The patient is aware the risks include but are not limited to: bleeding, infection, thrombosis of the cannulated access, and possible anaphylactic reaction to the contrast.  The patient is aware of the risks of the procedure and elects to proceed forward.  DESCRIPTION: After full informed written consent was obtained, the patient was brought back to the angiography suite and placed supine upon the angiography table.  The patient was connected to monitoring  equipment.  The right arm was prepped and draped in the standard fashion for a right arm fistulogram.  Under ultrasound guidance, the right arm arteriovenous fistula was evaluated with ultrasound, it was patent, an image was saved.  It was cannulated with a micropuncture needle.  The microwire was advanced into the fistula and the needle was exchanged for the a microsheath, which was lodged 2 cm into the access.  The wire was removed and the sheath was connected to the IV extension tubing.  Hand injections were completed to image the access from the antecubitum up to the level of axilla.  The central venous structures were also imaged by hand injections.  We elected to perform intervention.  A Bentson wire was used to cross the stenosis in the mid upper arm antegrade.  Patient was given 3000 units of IV heparin.  We exchanged for a short 6 [place] sheath.  We then angioplastied these lesions in the basilic vein with a 5 mm, 6 mm and 7 mm Mustang to nominal pressure for 2 minutes at each lesion.  At completion there was no significant residual stenosis with a excellent thrill.  A 4-0 Monocryl purse-string suture was sewn around the sheath.  The sheath was removed while tying down the suture.  A sterile bandage was applied to the puncture site.  COMPLICATIONS: None  CONDITION: Stable  Marty Heck, MD [facility] Office: [phone]   US Abdomen Limited RUQ (LIVER/GB)  Result Date: 01/29/2022 CLINICAL DATA:  Abnormal CT. EXAM: ULTRASOUND ABDOMEN LIMITED RIGHT UPPER QUADRANT COMPARISON:  Abdominopelvic CT earlier today. FINDINGS: Gallbladder: Surgically absent. Common bile duct: Diameter: 12 mm at the porta hepatis. No visualized choledocholithiasis, although the distal common bile duct is not well evaluated on the current exam. Liver: The intrahepatic biliary ductal dilatation on CT is not well seen. Mild heterogeneous and increased parenchymal echogenicity. Portal vein is  patent on color Doppler imaging with normal direction of  blood flow towards the liver. Other: Technically limited and challenging exam due to patient's medical condition, difficulty tolerating the exam. IMPRESSION: 1. Post cholecystectomy. Common bile duct dilatation, 12 mm at the porta hepatis. The intrahepatic biliary ductal dilatation on CT is not well-defined. There is no visualized choledocholithiasis, although the distal common bile duct is obscured on the current exam. Recommend correlation with LFTs. MRCP is not recommended at this time given patient is unable to tolerate ultrasound, node not tolerate holding still for MRI. 2. Suggestion of mild hepatic steatosis. Electronically Signed   By: Keith Rake M.D.   On: 01/29/2022 00:33   CT ABDOMEN PELVIS W CONTRAST  Result Date: 01/28/2022 CLINICAL DATA:  Nausea/vomiting. Abdominal pain, acute, nonlocalized EXAM: CT ABDOMEN AND PELVIS WITH CONTRAST TECHNIQUE: Multidetector CT imaging of the abdomen and pelvis was performed using the standard protocol following bolus administration of intravenous contrast. RADIATION DOSE REDUCTION: This exam was performed according to the departmental dose-optimization program which includes automated exposure control, adjustment of the mA and/or kV according to patient size and/or use of iterative reconstruction technique. CONTRAST:  136m OMNIPAQUE IOHEXOL 300 MG/ML  SOLN COMPARISON:  12/22/2021 FINDINGS: Lower chest: Visualized lung bases are clear. Cardiac size within normal limits. Mild left ventricular hypertrophy noted. Central venous catheter tip noted within the superior right atrium. No pericardial effusion peer Hepatobiliary: Status post cholecystectomy. Mild intra and moderate extrahepatic biliary ductal dilation appears slightly progressive since prior examination with the extrahepatic bile duct measuring up to 16 mm in diameter. While this may represent post cholecystectomy change, distal obstructing  process is not excluded on this limited examination. 9 mm hypodensity within the right hepatic lobe, axial image # 14/3 is indeterminate but may represent a tiny cyst or hemangioma in a patient without a history of malignancy. The liver is otherwise unremarkable. Pancreas: Unremarkable Spleen: Unremarkable Adrenals/Urinary Tract: Stable 21 mm left adrenal nodule better characterized as a benign adrenal adenoma on prior noncontrast CT examination of 11/18/2019. The kidneys are markedly atrophic bilaterally with numerous simple cysts seen bilaterally in keeping with cystic change of chronic renal failure. No follow-up imaging is recommended for these lesions. No definite enhancing intrarenal mass is identified. No hydronephrosis. No intrarenal or ureteral calculi. The bladder is unremarkable. Stomach/Bowel: Mild sigmoid diverticulosis. Stomach, small bowel, and large bowel are otherwise unremarkable. Appendix normal. No free intraperitoneal gas or fluid. Vascular/Lymphatic: Moderate aortoiliac atherosclerotic calcification. No aortic aneurysm. No pathologic adenopathy within the abdomen and pelvis. Reproductive: Status post hysterectomy. No adnexal masses. Other: Umbilical hernia repair with mesh has been performed. There is diastasis of the rectus abdominus musculature with protuberance in the umbilical region without recurrent frank hernia. The rectum is unremarkable. Musculoskeletal: Advanced degenerative changes are noted within the right hip. L4-5 lumbar fusion with instrumentation has been performed no acute bone abnormality. No lytic or blastic bone lesion. IMPRESSION: 1. No acute intra-abdominal pathology identified. No definite radiographic explanation for the patient's reported symptoms. 2. Status post cholecystectomy. Progressive intra and extrahepatic biliary ductal dilation. While this may represent post cholecystectomy change, distal obstructing process is not excluded on this limited examination.  Correlation with liver enzymes may be helpful for further evaluation. 3. Stable left adrenal adenoma. 4. Mild sigmoid diverticulosis. 5. Status post umbilical hernia repair with mesh. Diastasis of the rectus abdominus musculature with protuberance in the umbilical region without recurrent frank hernia. Aortic Atherosclerosis (ICD10-I70.0). Electronically Signed   By: AFidela SalisburyM.D.   On: 01/28/2022 20:57    EKG: Independently  reviewed.  NSR with rate 96; LVH; prolonged QTc 574; nonspecific ST changes with no evidence of acute ischemia   Labs on Admission: I have personally reviewed the available labs and imaging studies at the time of the admission.  Pertinent labs:    BUN 7/Creatinine 3.33/GFR 15 Anion gap 16 Normal CBC   Assessment and Plan: Principal Problem:   Abdominal pain Active Problems:   Hx of bipolar disorder   Opioid use disorder, severe, dependence (HCC)   Substance abuse (HCC)   Hyperkalemia   Tardive dyskinesia   ESRD on dialysis [facility])   Dialysis AV fistula malfunction, initial encounter (HCC)    Abdominal pain -Patient with prior similar pain that led to cholecystectomy presenting with acute mid-epigastric abdominal pain -CT done with progressive intra- and extrahepatic biliary ductal dilatation  -[place] was then performed and showed CBD dilatation up to 12 mm -Normal LFTs -Needs MRCP when able to tolerate; ativan ordered -GI is consulting  ESRD on HD, fistula failure -Patient on chronic MWF HD, had HD yesterday -Nephrology prn order set utilized -She does not appear to be volume overloaded or otherwise in need of acute HD -Nephrology was notified that patient will need HD  -Meanwhile, she also had an apparent fistula failure so has been using [facility] -She was due for fistulogram today and has already failed to show for one procedure -Dr. Carlis Abbott notified and he will plan to do fistulogram today  ETOH/polysubstance dependence/Bipolar d/o/tardive -Patient  reports living alone and does not list a NOK -Continue home meds - fluoxetine, Ingrezza  HTN -Continue isosorbide, labetalol, hydralazine -Will add prn IV hydralazine  Chronic pain -I have reviewed this patient in the [place] Controlled Substances Reporting System.  She is receiving medications from only one provider and appears to be taking them as prescribed. -She is at particularly high risk of opioid misuse, diversion, or overdose.  -Will continue [place] home dose, if possible; pharmacy is assisting and if unavailable we can try to find an alternative option -Will add low-dose fentanyl for breakthrough pain with no further escalation of pain medications     Advance Care Planning:   Code Status: Full Code   Consults: Vascular surgery; GI; nephrology  DVT Prophylaxis: SCDs  Family Communication: None present; she declined to have me contact family at the time of admission  Severity of Illness: The appropriate patient status for this patient is INPATIENT. Inpatient status is judged to be reasonable and necessary in order to provide the required intensity of service to ensure the patient's safety. The patient's presenting symptoms, physical exam findings, and initial radiographic and laboratory data in the context of their chronic comorbidities is felt to place them at high risk for further clinical deterioration. Furthermore, it is not anticipated that the patient will be medically stable for discharge from the hospital within 2 midnights of admission.   * I certify that at the point of admission it is my clinical judgment that the patient will require inpatient hospital care spanning beyond 2 midnights from the point of admission due to high intensity of service, high risk for further deterioration and high frequency of surveillance required.*  Author: Karmen Bongo, MD 01/29/2022 3:33 PM  For on call review [email or web address].

## 2022-01-29 NOTE — H&P (Signed)
History and Physical Interval Note:  01/29/2022 11:45 AM  Sherry [name]  has presented today for surgery, with Sherry diagnosis of avf malfunction.  Sherry various methods of treatment have been discussed with Sherry patient and family. After consideration of risks, benefits and other options for treatment, Sherry patient has consented to  Procedure(s): A/V Fistulagram (Right) as a surgical intervention.  Sherry patient's history has been reviewed, patient examined, no change in status, stable for surgery.  I have reviewed Sherry patient's chart and labs.  Questions were answered to Sherry patient's satisfaction.     Sherry [name]  HISTORY AND PHYSICAL      HPI: This is a 66 y.o. female presenting in follow-up status post right brachiobasilic fistula transposition.    Last seen, again was had issues with right brachiobasilic fistula cannulation as well as significant pain associated.  Several weeks ago, Sherry fistula was infiltrated, and has not been used since.  Sherry [name] is currently being dialyzed through a right-sided tunneled HD catheter.   Sherry [name] denies symptoms of steal syndrome.  Denies pain at Sherry [name] incision sites.   Sherry [name] is right hand dominant.    [name] is on dialysis.   Days of dialysis if applicable:  M/W/F    HD center if applicable:  [place] location.   Sherry [name] is not on a statin for cholesterol management.  Sherry [name] is not on a daily aspirin.  Other AC:  none Sherry [name] is on CCB, BB, hydralazine, diuretic for hypertension.  Sherry [name] is not diabetic.   Tobacco hx:  current       Past Medical History:  Diagnosis Date   Alcohol abuse 05/09/2019   Alcohol use disorder, severe, dependence ([place]) 08/27/2017   Alcohol withdrawal ([place]) 08/09/2017   Anxiety     ARF (acute renal failure) ([place]) 04/09/2020   Arthropathy of lumbar facet joint 05/24/2020   Bilateral low back pain with bilateral sciatica 01/05/2019   Bipolar and related disorder ([place])     Blood transfusion without reported diagnosis     Body  mass index (BMI) 40.0-44.9, adult ([place]) 07/19/2020   Breast cancer ([place])      right lumpectemy and lymph node    Cancer ([place]) 09/06/2017   Cervical spondylosis 05/24/2020   Chest pain 05/31/2020   Chest pain, rule out acute myocardial infarction 05/29/2020   CHF (congestive heart failure) (HCC)     Chronic back pain     Chronic bilateral low back pain with right-sided sciatica 05/24/2020   Chronic diastolic CHF (congestive heart failure) ([place]) 05/09/2019   Chronic kidney disease      "Stage IV" - states due to hypertension   Chronic kidney disease, stage III (moderate) ([place]) 11/17/2016   Chronic low back pain 09/06/2017    Disc levels:      No abnormality at L2-3 or above.      L3-4:  Mild bulging of Sherry disc.  No stenosis.      L4-5: Bilateral facet arthropathy with gaping, fluid-filled joints.   Joint edema. Anterolisthesis would likely occur at this level with   standing and flexion. Disc degeneration with mild bulging of Sherry   disc. Mild narrowing of Sherry lateral recesses and foramina, which   would likely worsen   Cigarette smoker 12/31/2017   CKD (chronic kidney disease) stage 4, GFR 15-29 ml/min (HCC) 04/18/2020   Clonus 05/24/2020   Cyst of ovary     Depression     Depression with anxiety 08/21/2016  Dizziness 02/24/2018   DOE (dyspnea on exertion) 12/30/2017    12/30/2017   Walked RA x one lap @ 185 stopped due to  Sob/ lightheaded weak with sats 96% at end - Spirometry 12/30/2017  FEV1 1.27 (68%)  Ratio 99 s curvature / abn effort dep portion only     Dyspnea     Dyspnea and respiratory abnormality 02/13/2013    Formatting of this note might be different from Sherry original. STORY: PSG on 05/20/12 showed no OSA but upper airway resistance syndrome, AHI 3.9, severe snoring was noted. ESS=8. UARS. Advise positional therapy, weight loss program, regular exercise.   ESRD (end stage renal disease) on dialysis ([place]) 08/22/2021    M-W-F   Essential hypertension 07/06/2017   GERD  (gastroesophageal reflux disease)     Grief reaction with prolonged bereavement 09/06/2017    Partner of 18 yrs left 2018-[name] admits Sherry [name] still loves Sherry [name]   Hepatitis C      recieved treatment for Hep C   Hepatitis C virus infection cured after antiviral drug therapy 12/17/2012    Telephone Encounter - Dub Mikes, RN - 12/28/2016 9:54 AM EDT Patient called for HCV RNA test results. EOT 08-26-16 - not detected. 12 week post treatment 12-11-16 - not detected. Informed patient that Sherry [name] was cured. Patient verbalized understanding.          History of breast cancer 02/13/2013   Hx of bipolar disorder 01/31/2016   Hypertension     Hypertensive emergency 10/21/2017   Impaired functional mobility, balance, gait, and endurance 01/09/2021   Left-sided weakness     MDD (major depressive disorder), recurrent severe, without psychosis ([place])     Medication intolerance 09/06/2017    Remeron-appetite stimulant/Dizziness/Dysphoria   Microcytic anemia 10/21/2017   Morbid obesity due to excess calories ([place]) 02/13/2013    BMI 42.9   MRSA infection      of breast incision   Nausea 04/09/2020    Formatting of this note might be different from Sherry original. Added automatically from request for surgery [id number]   Near syncope 05/29/2020   Neck pain 05/24/2020   Neuroleptic-induced tardive dyskinesia 09/06/2017    Abilify   Opioid dependence ([place])      Has been treated in [facility] in past   Opioid use disorder, severe, dependence ([place]) 08/27/2017   Oral aphthous ulcer 06/25/2015    Last Assessment & Plan:  Formatting of this note might be different from Sherry original. - Appear to be resolving as a be expected in Sherry case of aphthous stomatitis. - Continue topical therapy with viscous lidocaine.  Recommended trying high-dose anti-inflammatory such as 600 mg of ibuprofen as needed. - Sherry [name]'ll return to ENT clinic if symptoms do not resolve.   Phlebitis after infusion 10/28/2017    Rt antecubital fossa    Pre-operative cardiovascular examination 06/13/2018   Restless legs syndrome 02/13/2013    Formatting of this note might be different from Sherry original. IMPRESSION: Possible. Will follow.   Spondylolisthesis, lumbar region 10/21/2020   Subacute dyskinesia due to drug 02/13/2013    Formatting of this note might be different from Sherry original. STORY: Tardive dyskinesia and mild limb dyskinesia, LE>UE which likely due to prolonged antipsychotic used, abilify. Couldn't tolerate artane, gabapentin, clonazepam and xenazine.`E1o3L`IMPRESSION: Well tolerated depakote '250mg'$  bid, mood aspect is better as well. Will increase to '500mg'$  bid. ADR was discussed.  RTC 4-6 weeks.   Substance abuse ([place])     Substance induced mood disorder ([place]) 08/13/2017  TIA (transient ischemic attack) 2020    P/w BP >230/120 and neurologic symptoms, presumed TIA           Past Surgical History:  Procedure Laterality Date   AV FISTULA PLACEMENT Right 05/01/2021    Procedure: RIGHT ARTERIOVENOUS (AV) FISTULA CREATION;  Surgeon: Broadus John, MD;  Location: [facility];  Service: Vascular;  Laterality: Right;  PERIPHERAL NERVE BLOCK   BACK SURGERY   6767   BASCILIC VEIN TRANSPOSITION Right 09/02/2021    Procedure: RIGHT SECOND STAGE BASILIC VEIN TRANSPOSITION;  Surgeon: Broadus John, MD;  Location: [facility];  Service: Vascular;  Laterality: Right;  PERIPHERAL NERVE BLOCK   BREAST SURGERY Right 2010    "breast cancer survivor" - states partial mastectomy and nodes   COLONOSCOPY        [facility]   ESOPHAGOGASTRODUODENOSCOPY        [facility]   ESOPHAGOGASTRODUODENOSCOPY   04/18/2020    [facility]   LAPAROSCOPIC CHOLECYSTECTOMY   2002   LAPAROSCOPIC INCISIONAL / UMBILICAL / [place]   04/13/2018    with BARD 15x 20NO mesh (supraumbilical)   LAPAROSCOPIC LYSIS OF ADHESIONS   07/12/2018    Procedure: LAPAROSCOPIC LYSIS OF ADHESIONS;  Surgeon: Isabel Caprice, MD;  Location: [facility];  Service:  Gynecology;;   MULTIPLE TOOTH EXTRACTIONS       MYOMECTOMY        x 2 prior to hysterectomy   ROBOTIC ASSISTED BILATERAL SALPINGO OOPHERECTOMY Right 07/12/2018    Procedure: XI ROBOTIC ASSISTED RIGHT SALPINGO OOPHORECTOMY;  Surgeon: Isabel Caprice, MD;  Location: [facility];  Service: Gynecology;  Laterality: Right;   TOTAL ABDOMINAL HYSTERECTOMY        fibroids    UPPER GASTROINTESTINAL ENDOSCOPY       WISDOM TOOTH EXTRACTION               Allergies  Allergen Reactions   Abilify [Aripiprazole] Other (See Comments)      Tardive dyskinesia Oral   Remeron [Mirtazapine] Other (See Comments)      Wgt stimulation /gain, Dizziness, Patient says "can tolerate"   Trazodone And Nefazodone Other (See Comments)      Nightmares/sleep diturbance   Flexeril [Cyclobenzaprine] Other (See Comments)      [name] states Flexeril makes Sherry [name] feel depressed    Amoxicillin Diarrhea and Other (See Comments)      NOTE Sherry patient has had PCN WITHOUT reaction Has patient had a PCN reaction causing immediate rash, facial/tongue/throat swelling, SOB or lightheadedness with hypotension: No Has patient had a PCN reaction causing severe rash involving mucus membranes or skin necrosis: No Has patient had a PCN reaction that required hospitalization: No Has patient had a PCN reaction occurring within Sherry last 10 years: No If all of Sherry above answers are "NO", then may proceed with Cephalosporin use.              Current Outpatient Medications  Medication Sig Dispense Refill   albuterol (VENTOLIN HFA) 108 (90 Base) MCG/ACT inhaler Inhale 2 puffs into Sherry lungs every 4 (four) hours as needed for wheezing or shortness of breath.       amLODipine (NORVASC) 10 MG tablet Take 10 mg by mouth in Sherry morning.       benzonatate (TESSALON) 100 MG capsule Take 1 capsule (100 mg total) by mouth every 8 (eight) hours. (Patient not taking: Reported on 10/24/2021) 21 capsule 0   chlordiazePOXIDE (LIBRIUM) 25 MG  capsule '50mg'$  PO TID x  1D, then 25-'50mg'$  PO BID X 1D, then 25-'50mg'$  PO QD X 1D (Patient not taking: Reported on 10/24/2021) 10 capsule 0   Cholecalciferol (VITAMIN D3) 50 MCG (2000 UT) capsule Take 1 capsule (2,000 Units total) by mouth daily. 30 capsule 0   docusate sodium (COLACE) 100 MG capsule Take 100 mg by mouth daily as needed for mild constipation.       ferrous sulfate 325 (65 FE) MG tablet Take 325 mg by mouth in Sherry morning and at bedtime. (Patient not taking: Reported on 10/24/2021)       FLUoxetine (PROZAC) 40 MG capsule Take 40 mg by mouth in Sherry morning.       furosemide (LASIX) 40 MG tablet Take 40 mg by mouth daily. (Patient not taking: Reported on 10/24/2021)       gabapentin (NEURONTIN) 300 MG capsule Take 300 mg by mouth 3 (three) times daily.       hydrALAZINE (APRESOLINE) 50 MG tablet Take 100 mg by mouth 3 (three) times daily.       INGREZZA 80 MG CAPS Take 80 mg by mouth at bedtime.       isosorbide mononitrate (IMDUR) 60 MG 24 hr tablet Take 60 mg by mouth in Sherry morning.       labetalol (NORMODYNE) 300 MG tablet Take 300 mg by mouth 2 (two) times daily.       lidocaine (LIDODERM) 5 % Place 1 patch onto Sherry skin daily. Remove & Discard patch within 12 hours or as directed by MD 30 patch 0   Melatonin 10 MG TABS Take 10 mg by mouth daily as needed (sleep).       methocarbamol (ROBAXIN) 500 MG tablet Take 1 tablet (500 mg total) by mouth 2 (two) times daily. 20 tablet 0   ondansetron (ZOFRAN-ODT) 4 MG disintegrating tablet Take 4 mg by mouth every 8 (eight) hours as needed for vomiting or nausea.       oxyCODONE-acetaminophen (PERCOCET/ROXICET) 5-325 MG tablet Take 1 tablet by mouth every 6 (six) hours as needed for severe pain. 10 tablet 0   pantoprazole (PROTONIX) 40 MG tablet Take 40 mg by mouth daily.       predniSONE (STERAPRED UNI-PAK 21 TAB) 10 MG (21) TBPK tablet Take by mouth daily. Take 6 tabs by mouth daily  for 2 days, then 5 tabs for 2 days, then 4 tabs for 2 days, then 3 tabs for 2 days, 2  tabs for 2 days, then 1 tab by mouth daily for 2 days 42 tablet 0   sodium bicarbonate 650 MG tablet Take 1,300 mg by mouth 2 (two) times daily.        No current facility-administered medications for this visit.           Family History  Problem Relation Age of Onset   Heart attack Mother     Breast cancer Mother 16   Dementia Mother     Cancer Father 65        died of bleeding from kidneys   Colon cancer Neg Hx     Esophageal cancer Neg Hx     Stomach cancer Neg Hx     Rectal cancer Neg Hx        Social History         Socioeconomic History   Marital status: Single      Spouse name: Not on file   Number of children: 0   Years  of education: 12   Highest education level: High school graduate  Occupational History   Occupation: Disabled  Tobacco Use   Smoking status: Every Day      Packs/day: 0.50      Types: Cigarettes      Passive exposure: Never   Smokeless tobacco: Never  Vaping Use   Vaping Use: Former   Start date: 06/27/2013   Quit date: 06/14/2017   Devices: Apple Cinnamon-0 mg  Substance and Sexual Activity   Alcohol use: Not Currently      Comment: hx over 30 years   Drug use: Not Currently      Comment: h/o IVD use   Sexual activity: Not Currently  Other Topics Concern   Not on file  Social History Narrative    Lives at home alone.    Right-handed.    Caffeine use: 4 cups coffee/soda    Social Determinants of Health    Financial Resource Strain: Not on file  Food Insecurity: Not on file  Transportation Needs: Not on file  Physical Activity: Not on file  Stress: Not on file  Social Connections: Not on file  Intimate Partner Violence: Not on file        ROS: '[x]'$  Positive   '[ ]'$  Negative   '[ ]'$  All sytems reviewed and are negative   Cardiac: '[x]'$  hx CHF    Vascular: '[]'$  pain in legs while walking   Pulmonary: '[]'$  asthma '[]'$  wheezing   Hematologic/oncology '[x]'$  breast cancer   Musculoskeletal '[x]'$  chronic back issues   GU: '[x]'$   CKD/renal failure  '[x]'$  HD---'[x]'$  M/W/F '[]'$  T/T/S   Psychiatric: '[x]'$  hx of depression   Integumentary: '[]'$  rashes '[]'$  ulcers   Constitutional: '[]'$  fever '[]'$  chills     PHYSICAL EXAMINATION:   There were no vitals filed for this visit.     There is no height or weight on file to calculate BMI.       General:  WDWN female in NAD Gait: Normal HENT: WNL Pulmonary: normal non-labored breathing  Cardiac: regular Skin: without rashes Vascular Exam/Pulses:  Palpable right radial pulse Extremities:  excellent thrill in fistula; right hand is warm and well perfused.  No evidence of hematoma in Sherry upper arm.  Pulsatility appreciated in Sherry antecubital fossa Musculoskeletal: no muscle wasting or atrophy       Neurologic: A&O X 3; Speech is fluent/normal   Non-Invasive Vascular Imaging:   Findings:  Findings: +--------------------+----------+-----------------+--------+ AVF                 PSV (cm/s)Flow Vol (mL/min)Comments +--------------------+----------+-----------------+--------+ Native artery inflow   189          1042                +--------------------+----------+-----------------+--------+ AVF Anastomosis        230                              +--------------------+----------+-----------------+--------+     +------------+----------+-------------+-----------+----------------------------+ OUTFLOW VEINPSV (cm/s)Diameter (cm)Depth (cm)           Describe           +------------+----------+-------------+-----------+----------------------------+ Prox UA        418        0.53        1.44      proximal , axillary area   +------------+----------+-------------+-----------+----------------------------+ Mid UA      275 / 724  0.44 / 0.20  0.44 / 0.64   change in Diameter and                                                    approx 2.48 cm in length   +------------+----------+-------------+-----------+----------------------------+ Dist UA         279        0.43        0.77                                 +------------+----------+-------------+-----------+----------------------------+ AC Fossa       272        0.56        1.04                                 +------------+----------+-------------+-----------+----------------------------+   ASSESSMENT/PLAN: 66 y.o. female status post second stage basilic vein transposition 09/02/21.   Sherry [name] had difficulty with cannulation with recent infiltration and significant pain associated. Fistula ultrasound reviewed demonstrating adequate flow with an area of stenosis in Sherry mid upper arm.  There is also more depth appreciated on today's ultrasound than previous.   Sherry [name] would benefit from a right upper extremity fistulogram in an effort to define, and hopefully improve flow through Sherry fistula.  While this will not improve Sherry pain that Sherry [name] appreciates during cannulation, my goal is to increase Sherry size of Sherry fistula for improved accuracy to limit filtration.  I counseled Sherry [name] that should Sherry [name] fistula continues to have problems, Sherry [name] would be best served with ligation and new HD access.  Discussed Sherry risk and benefits of fistulogram, Sherry [name] elected to proceed.     Broadus John MD [facility] Phone Number: [phone] 01/07/2022 12:50 PM

## 2022-01-29 NOTE — Progress Notes (Addendum)
Pt receives out-pt HD at [facility] on MWF. Pt arrives at 11:55 for 12:15 chair time. Will assist as needed.   Melven Sartorius Renal Navigator [phone]

## 2022-01-29 NOTE — Progress Notes (Signed)
01/29/2022 1340 Received pt to room 4E-05 from Cath Lab.  Pt is A&O, C/O pain in her epigastric, asking for pain meds on arrival.  Tele monitor applied and CCMD notified.  CHG bath given.  Oriented to room,call light and bed.  Call bell in reach.   Carney [place]ers

## 2022-01-29 NOTE — ED Provider Notes (Signed)
Received at shift change from Dorise Bullion PA-C please see note for full detail  Patient medical history including end-stage renal disease on dialysis Monday Wednesday Friday, hypertension, CHF, TIA, chronic pain presents with sudden onset of stomach pain, pain is in the middle of her abdomen, does not radiate, is constant, associated nausea vomiting, denies any constipation no diarrhea still passing gas having normal bowel movements, patient does make urine.  She has no other complaints.  Never had this in the past.  She has had cholecystectomy  Per previous provider follow-up on ultrasound as well as UA and treat accordingly. Physical Exam  BP (!) 156/88 (BP Location: Right Arm)   Pulse 85   Temp 98.9 F (37.2 C) (Oral)   Resp 18   Ht '5\' 2"'$  (1.575 m)   SpO2 97%   BMI 34.02 kg/m   Physical Exam Vitals and nursing note reviewed.  Constitutional:      General: She is not in acute distress.    Appearance: She is not ill-appearing.  HENT:     Head: Normocephalic and atraumatic.     Nose: No congestion.  Eyes:     Conjunctiva/sclera: Conjunctivae normal.  Cardiovascular:     Rate and Rhythm: Normal rate and regular rhythm.     Pulses: Normal pulses.     Heart sounds: No murmur heard.    No friction rub. No gallop.  Pulmonary:     Effort: No respiratory distress.     Breath sounds: No wheezing, rhonchi or rales.  Abdominal:     Palpations: Abdomen is soft.     Tenderness: There is abdominal tenderness. There is no right CVA tenderness or left CVA tenderness.     Comments: Abdomen nondistended, has tenderness noted in the epigastric region without guarding rebound has peritoneal sign negative Murphy sign McBurney point.  Skin:    General: Skin is warm and dry.  Neurological:     Mental Status: She is alert.     Comments: Patient has noted tardive dyskinesia which is at her baseline.  Psychiatric:        Mood and Affect: Mood normal.     Procedures  Procedures  ED  Course / MDM    Medical Decision Making Amount and/or Complexity of Data Reviewed Labs: ordered. Radiology: ordered.  Risk Prescription drug management.    Co morbidities that complicate the patient evaluation  End-stage renal disease, TIA, hypertension  Social Determinants of Health:  Geriatric    Lab Tests:  I Ordered, and personally interpreted labs.  The pertinent results include: CBC unremarkable, CMP shows creatinine 3.3 GFR 15 lipase 41   Imaging Studies ordered:  I ordered imaging studies including CT abdomen pelvis, limited right upper ultrasound I independently visualized and interpreted imaging which showed CT scan shows dilated intra and extrahepatic ductal dilation, no obvious stone present, ultrasound shows ductal dilation of 12 mm without obvious stone. I agree with the radiologist interpretation   Cardiac Monitoring:  The patient was maintained on a cardiac monitor.  I personally viewed and interpreted the cardiac monitored which showed an underlying rhythm of: N/A   Medicines ordered and prescription drug management:  I ordered medication including Dilaudid for pain I have reviewed the patients home medicines and have made adjustments as needed  Critical Interventions:  N/A   Reevaluation:  Patient is reassessed, endorsing continued abdominal pain, she is tender during my exam, will provide with additional dose of Dilaudid, and recommend admission for MRCP for rule out  of choledocholithiasis.  Patient agreed this plan  Consultations Obtained:  I requested consultation with the hospitalist Dr. Marlowe Sax,  and discussed lab and imaging findings as well as pertinent plan - they recommend: We will admit the patient Sent a secure chat to Dr. Ardis Hughs of gastroenterology for consultation in the morning of common bile duct dilation.    Test Considered:  N/A    Rule out Low suspicion for cholangitis afebrile no leukocytosis, presentation  atypical etiology.  Low suspicion for mesenteric ischemia as presentation atypical etiology.  Laceration patient need emergent hemodialysis none volume overload, no respiratory distress, no electrolyte derailment.    Dispostion and problem list  After consideration of the diagnostic results and the patients response to treatment, I feel that the patent would benefit from admission.  Abdominal pain-patient will need further evaluation of ductal dilation, likely MRCP and formal GI consultation.           Marcello Fennel, PA-C 01/29/22 0340    Merrily Pew, MD 01/29/22 [phone]

## 2022-01-29 NOTE — Op Note (Signed)
    OPERATIVE NOTE   PROCEDURE: right brachiobasilic arteriovenous fistula cannulation under ultrasound guidance right arm fistulogram including central venogram right basilic vein angioplasty in the mid upper arm (5 mm x 80 mm Mustang, 6 mm x 100 mm Mustang, and 7 mm x 80 mm Mustang)  PRE-OPERATIVE DIAGNOSIS: Malfunctioning right arm arteriovenous fistula  POST-OPERATIVE DIAGNOSIS: same as above   SURGEON: Marty Heck, MD  ANESTHESIA: local  ESTIMATED BLOOD LOSS: 5 cc  FINDING(S): Difficult to visualize central venous system given catheter and poor compliance of patient on the table.  The mid upper arm basilic vein had two stenotic areas one greater than 99% and the other well over 70%.  Both of these were crossed with antegrade access and angioplastied with a 5 mm, 6 mm and 7 mm angioplasty balloon.  Patient had a pulsatile fistula at the start of the case and now has an excellent thrill.  Excellent results and fistula is available for use immediately.  SPECIMEN(S):  None  CONTRAST: 65 mL  INDICATIONS: Sherry [name] is a 66 y.o. female who presents with malfunctioning right brachiobasilic arteriovenous fistula.  The patient is scheduled for right arm fistuloram.  The patient is aware the risks include but are not limited to: bleeding, infection, thrombosis of the cannulated access, and possible anaphylactic reaction to the contrast.  The patient is aware of the risks of the procedure and elects to proceed forward.  DESCRIPTION: After full informed written consent was obtained, the patient was brought back to the angiography suite and placed supine upon the angiography table.  The patient was connected to monitoring equipment.  The right arm was prepped and draped in the standard fashion for a right arm fistulogram.  Under ultrasound guidance, the right arm arteriovenous fistula was evaluated with ultrasound, it was patent, an image was saved.  It was cannulated with a  micropuncture needle.  The microwire was advanced into the fistula and the needle was exchanged for the a microsheath, which was lodged 2 cm into the access.  The wire was removed and the sheath was connected to the IV extension tubing.  Hand injections were completed to image the access from the antecubitum up to the level of axilla.  The central venous structures were also imaged by hand injections.  We elected to perform intervention.  A Bentson wire was used to cross the stenosis in the mid upper arm antegrade.  Patient was given 3000 units of IV heparin.  We exchanged for a short 6 [place] sheath.  We then angioplastied these lesions in the basilic vein with a 5 mm, 6 mm and 7 mm Mustang to nominal pressure for 2 minutes at each lesion.  At completion there was no significant residual stenosis with a excellent thrill.  A 4-0 Monocryl purse-string suture was sewn around the sheath.  The sheath was removed while tying down the suture.  A sterile bandage was applied to the puncture site.  COMPLICATIONS: None  CONDITION: Stable  Marty Heck, MD [facility] Office: [place]   01/29/2022 1:23 PM

## 2022-01-29 NOTE — ED Notes (Signed)
While administering ordered PRN pain medication, pt requesting "that other medicine, dilaudid." Pt informed that Fentanyl is currently the only ordered IV medication available for pain.

## 2022-01-29 NOTE — Consult Note (Addendum)
Consultation  Referring Provider: No ref. provider found Primary Care Physician:  Wynelle Fanny, DO Primary Gastroenterologist:  Dr. Bryan Lemma  Reason for Consultation: Acute abdominal pain  HPI: Sherry [name] is a 66 y.o. female, who presented to the emergency room last night apparently after she had had dialysis with complaints of acute abdominal pain onset within the past few days and worse yesterday, she has had nausea but no vomiting, no fever or chills, no diarrhea, no melena and no hematemesis. She has history of severe EtOH and polysubstance abuse/dependence, bipolar disorder, chronic back pain, chronic congestive heart failure, end-stage renal disease on dialysis, hypertension, hepatitis C, and history of tardive dyskinesia.  Currently a poor historian, and repeatedly begging for pain medication during interview. She is status post cholecystectomy. She underwent CT of the abdomen pelvis last p.m. which shows mild intra and extrahepatic ductal dilation with CBD of 16 mm, prior umbilical hernia repair with mesh and sigmoid diverticulosis.  Upper abdominal ultrasound has shown a CBD of 12 mm at the porta hepatis.  Poor quality study as patient on ability to hold still for the ultrasound and therefore MRCP was not recommended.  Labs show WBC of 7.5/hemoglobin 12.5/hematocrit 39.5/MCV 84 BUN 7/creatinine 3.33 LFTs within normal limits Lipase 41 Drug screen not done.  She had undergone prior EGD with Dr. Bryan Lemma in July 2022 which showed mild gastritis and also had gastric mapping done because of prior diagnosis of intestinal metaplasia made at [facility] in 2018.  Biopsies showed reactive gastropathy, no intestinal metaplasia, no H. Pylori.  She is not on H2 blocker or PPI at home, denies chronic narcotic use at home. She denies current EtOH or substance abuse currently.   Past Medical History:  Diagnosis Date   Alcohol use disorder, severe, dependence ([place]) 08/27/2017   Anxiety     Body mass index (BMI) 40.0-44.9, adult ([place]) 07/19/2020   Breast cancer ([place])    right lumpectemy and lymph node    Cervical spondylosis 05/24/2020   Chronic bilateral low back pain with right-sided sciatica 05/24/2020   Chronic diastolic CHF (congestive heart failure) ([place]) 05/09/2019   Cigarette smoker 12/31/2017   ESRD (end stage renal disease) on dialysis ([place]) 08/22/2021   M-W-F   Essential hypertension 07/06/2017   GERD (gastroesophageal reflux disease)    Hepatitis C virus infection cured after antiviral drug therapy 12/17/2012   Telephone Encounter - Dub Mikes, RN - 12/28/2016 9:54 AM EDT Patient called for HCV RNA test results. EOT 08-26-16 - not detected. 12 week post treatment 12-11-16 - not detected. Informed patient that she was cured. Patient verbalized understanding.          Hx of bipolar disorder 01/31/2016   Impaired functional mobility, balance, gait, and endurance 01/09/2021   MRSA infection    of breast incision   Neuroleptic-induced tardive dyskinesia 09/06/2017   Abilify   Opioid use disorder, severe, dependence ([place]) 08/27/2017   Restless legs syndrome 02/13/2013   Formatting of this note might be different from the original. IMPRESSION: Possible. Will follow.   Subacute dyskinesia due to drug 02/13/2013   Formatting of this note might be different from the original. STORY: Tardive dyskinesia and mild limb dyskinesia, LE>UE which likely due to prolonged antipsychotic used, abilify. Couldn't tolerate artane, gabapentin, clonazepam and xenazine.`E1o3L`IMPRESSION: Well tolerated depakote '250mg'$  bid, mood aspect is better as well. Will increase to '500mg'$  bid. ADR was discussed.  RTC 4-6 weeks.   TIA (transient ischemic attack) 2020   P/w BP >  230/120 and neurologic symptoms, presumed TIA    Past Surgical History:  Procedure Laterality Date   AV FISTULA PLACEMENT Right 05/01/2021   Procedure: RIGHT ARTERIOVENOUS (AV) FISTULA CREATION;  Surgeon: Broadus John, MD;   Location: [place];  Service: Vascular;  Laterality: Right;  PERIPHERAL NERVE BLOCK   BACK SURGERY  0350   BASCILIC VEIN TRANSPOSITION Right 09/02/2021   Procedure: RIGHT SECOND STAGE BASILIC VEIN TRANSPOSITION;  Surgeon: Broadus John, MD;  Location: [place];  Service: Vascular;  Laterality: Right;  PERIPHERAL NERVE BLOCK   BREAST SURGERY Right 2010   "breast cancer survivor" - states partial mastectomy and nodes   COLONOSCOPY     [facility]   ESOPHAGOGASTRODUODENOSCOPY     [facility]   ESOPHAGOGASTRODUODENOSCOPY  04/18/2020   [facility]   LAPAROSCOPIC CHOLECYSTECTOMY  2002   LAPAROSCOPIC INCISIONAL / UMBILICAL / [place]  04/13/2018   with BARD 15x 09FG mesh (supraumbilical)   LAPAROSCOPIC LYSIS OF ADHESIONS  07/12/2018   Procedure: LAPAROSCOPIC LYSIS OF ADHESIONS;  Surgeon: Isabel Caprice, MD;  Location: [facility];  Service: Gynecology;;   MULTIPLE TOOTH EXTRACTIONS     MYOMECTOMY     x 2 prior to hysterectomy   ROBOTIC ASSISTED BILATERAL SALPINGO OOPHERECTOMY Right 07/12/2018   Procedure: XI ROBOTIC ASSISTED RIGHT SALPINGO OOPHORECTOMY;  Surgeon: Isabel Caprice, MD;  Location: [facility];  Service: Gynecology;  Laterality: Right;   TOTAL ABDOMINAL HYSTERECTOMY     fibroids    UPPER GASTROINTESTINAL ENDOSCOPY     WISDOM TOOTH EXTRACTION      Prior to Admission medications   Medication Sig Start Date End Date Taking? Authorizing Provider  albuterol (VENTOLIN HFA) 108 (90 Base) MCG/ACT inhaler Inhale 2 puffs into the lungs every 4 (four) hours as needed for wheezing or shortness of breath. 04/23/21   [provider]  BELBUCA 600 MCG FILM Take 1 Film by mouth 2 (two) times daily. 01/12/22   [provider]  FLUoxetine (PROZAC) 40 MG capsule Take 40 mg by mouth in the morning. 11/09/18   [provider]  gabapentin (NEURONTIN) 400 MG capsule Take 400 mg by mouth in the morning, at noon, and at bedtime. 12/28/21   [provider]   hydrALAZINE (APRESOLINE) 50 MG tablet Take 50 mg by mouth 3 (three) times daily. 03/24/21   [provider]  INGREZZA 80 MG CAPS Take 80 mg by mouth at bedtime. 10/15/17   [provider]  isosorbide dinitrate (ISORDIL) 20 MG tablet Take 20 mg by mouth 3 (three) times daily. 12/30/21   [provider]  labetalol (NORMODYNE) 300 MG tablet Take 300 mg by mouth 2 (two) times daily.    [provider]  lidocaine (LIDODERM) 5 % Place 1 patch onto the skin daily. Remove & Discard patch within 12 hours or as directed by MD Patient taking differently: Place 1 patch onto the skin daily as needed (back pain). Remove & Discard patch within 12 hours or as directed by MD 12/27/21   Evlyn Courier, PA-C  Melatonin 10 MG TABS Take 10 mg by mouth at bedtime as needed (sleep).    [provider]  oxyCODONE-acetaminophen (PERCOCET/ROXICET) 5-325 MG tablet Take 1 tablet by mouth every 6 (six) hours as needed for severe pain. Patient not taking: Reported on 01/27/2022 12/27/21   Evlyn Courier, PA-C  pantoprazole (PROTONIX) 40 MG tablet Take 40 mg by mouth daily. 04/05/21   [provider]  traMADol (  ULTRAM) 50 MG tablet Take 50-100 mg by mouth 3 (three) times daily as needed for severe pain.    [provider]  vitamin C (ASCORBIC ACID) 500 MG tablet Take 500 mg by mouth daily.    [provider]    Current Facility-Administered Medications  Medication Dose Route Frequency Provider Last Rate Last Admin   acetaminophen (TYLENOL) tablet 650 mg  650 mg Oral Q6H PRN Karmen Bongo, MD       Or   acetaminophen (TYLENOL) suppository 650 mg  650 mg Rectal Q6H PRN Karmen Bongo, MD       albuterol (PROVENTIL) (2.5 MG/3ML) 0.083% nebulizer solution 2.5 mg  2.5 mg Nebulization Q2H PRN Karmen Bongo, MD       albuterol (VENTOLIN HFA) 108 (90 Base) MCG/ACT inhaler 2 puff  2 puff Inhalation Q4H PRN Karmen Bongo, MD       Buprenorphine HCl FILM 1 Film  1 Film  Oral BID Karmen Bongo, MD       calcium carbonate (dosed in mg elemental calcium) suspension 500 mg of elemental calcium  500 mg of elemental calcium Oral Q6H PRN Karmen Bongo, MD       camphor-menthol [facility]) lotion 1 Application  1 Application Topical W2B PRN Karmen Bongo, MD       And   hydrOXYzine (ATARAX) tablet 25 mg  25 mg Oral Q8H PRN Karmen Bongo, MD       docusate sodium (ENEMEEZ) enema 283 mg  1 enema Rectal PRN Karmen Bongo, MD       fentaNYL (SUBLIMAZE) injection 25 mcg  25 mcg Intravenous Q2H PRN Karmen Bongo, MD       FLUoxetine (PROZAC) capsule 40 mg  40 mg Oral q AM Karmen Bongo, MD       gabapentin (NEURONTIN) capsule 400 mg  400 mg Oral TID Karmen Bongo, MD       hydrALAZINE (APRESOLINE) injection 5 mg  5 mg Intravenous Q4H PRN Karmen Bongo, MD       hydrALAZINE (APRESOLINE) tablet 50 mg  50 mg Oral TID Karmen Bongo, MD       isosorbide dinitrate (ISORDIL) tablet 20 mg  20 mg Oral TID Karmen Bongo, MD       labetalol (NORMODYNE) tablet 300 mg  300 mg Oral BID Karmen Bongo, MD       LORazepam (ATIVAN) injection 1 mg  1 mg Intravenous Once PRN Karmen Bongo, MD       Melatonin TABS 10 mg  10 mg Oral QHS PRN Karmen Bongo, MD       nicotine (NICODERM CQ - dosed in mg/24 hours) patch 14 mg  14 mg Transdermal Daily PRN Karmen Bongo, MD       ondansetron [facility]) tablet 4 mg  4 mg Oral Q6H PRN Karmen Bongo, MD       Or   ondansetron [facility]) injection 4 mg  4 mg Intravenous Q6H PRN Karmen Bongo, MD       sorbitol 70 % solution 30 mL  30 mL Oral PRN Karmen Bongo, MD       traMADol Veatrice Bourbon) tablet 50-100 mg  50-100 mg Oral TID PRN Karmen Bongo, MD       valbenazine [facility]) capsule 80 mg  80 mg Oral Ivery Quale, MD       zolpidem Lorrin Mais) tablet 5 mg  5 mg Oral QHS PRN Karmen Bongo, MD       Current Outpatient Medications  Medication Sig Dispense Refill  albuterol (VENTOLIN HFA) 108 (90 Base) MCG/ACT inhaler  Inhale 2 puffs into the lungs every 4 (four) hours as needed for wheezing or shortness of breath.     BELBUCA 600 MCG FILM Take 1 Film by mouth 2 (two) times daily.     FLUoxetine (PROZAC) 40 MG capsule Take 40 mg by mouth in the morning.     gabapentin (NEURONTIN) 400 MG capsule Take 400 mg by mouth in the morning, at noon, and at bedtime.     hydrALAZINE (APRESOLINE) 50 MG tablet Take 50 mg by mouth 3 (three) times daily.     INGREZZA 80 MG CAPS Take 80 mg by mouth at bedtime.     isosorbide dinitrate (ISORDIL) 20 MG tablet Take 20 mg by mouth 3 (three) times daily.     labetalol (NORMODYNE) 300 MG tablet Take 300 mg by mouth 2 (two) times daily.     lidocaine (LIDODERM) 5 % Place 1 patch onto the skin daily. Remove & Discard patch within 12 hours or as directed by MD (Patient taking differently: Place 1 patch onto the skin daily as needed (back pain). Remove & Discard patch within 12 hours or as directed by MD) 30 patch 0   Melatonin 10 MG TABS Take 10 mg by mouth at bedtime as needed (sleep).     oxyCODONE-acetaminophen (PERCOCET/ROXICET) 5-325 MG tablet Take 1 tablet by mouth every 6 (six) hours as needed for severe pain. (Patient not taking: Reported on 01/27/2022) 10 tablet 0   pantoprazole (PROTONIX) 40 MG tablet Take 40 mg by mouth daily.     traMADol (ULTRAM) 50 MG tablet Take 50-100 mg by mouth 3 (three) times daily as needed for severe pain.     vitamin C (ASCORBIC ACID) 500 MG tablet Take 500 mg by mouth daily.      Allergies as of 01/28/2022 - Review Complete 01/28/2022  Allergen Reaction Noted   Abilify [aripiprazole] Other (See Comments) 09/06/2017   Remeron [mirtazapine] Other (See Comments) 09/06/2017   Trazodone and nefazodone Other (See Comments) 09/06/2017   Flexeril [cyclobenzaprine] Other (See Comments) 04/14/2015   Amoxicillin Diarrhea and Other (See Comments) 08/12/2017    Family History  Problem Relation Age of Onset   Heart attack Mother    Breast cancer Mother  58   Dementia Mother    Cancer Father 46       died of bleeding from kidneys   Colon cancer Neg Hx    Esophageal cancer Neg Hx    Stomach cancer Neg Hx    Rectal cancer Neg Hx     Social History   Socioeconomic History   Marital status: Single    Spouse name: Not on file   Number of children: 0   Years of education: 12   Highest education level: High school graduate  Occupational History   Occupation: Disabled  Tobacco Use   Smoking status: Every Day    Packs/day: 0.50    Types: Cigarettes    Passive exposure: Never   Smokeless tobacco: Never  Vaping Use   Vaping Use: Former   Start date: 06/27/2013   Quit date: 06/14/2017   Devices: Apple Cinnamon-0 mg  Substance and Sexual Activity   Alcohol use: Not Currently    Comment: hx over 30 years   Drug use: Not Currently    Comment: h/o IVD use   Sexual activity: Not Currently  Other Topics Concern   Not on file  Social History Narrative   Lives at  home alone.   Right-handed.   Caffeine use: 4 cups coffee/soda   Social Determinants of Health   Financial Resource Strain: Not on file  Food Insecurity: Not on file  Transportation Needs: Not on file  Physical Activity: Not on file  Stress: Not on file  Social Connections: Not on file  Intimate Partner Violence: Not on file    Review of Systems: Pertinent positive and negative review of systems were noted in the above HPI section.  All other review of systems was otherwise negative.   Physical Exam: Vital signs in last 24 hours: Temp:  [98.7 F (37.1 C)-98.9 F (37.2 C)] 98.9 F (37.2 C) (07/20 0639) Pulse Rate:  [80-104] 80 (07/20 0911) Resp:  [16-22] 18 (07/20 0911) BP: (137-171)/(82-116) 171/82 (07/20 0911) SpO2:  [97 %-100 %] 98 % (07/20 0911)   General:   Alert,  Well-developed, obese African-American female with tardive dyskinesia cooperative in NAD.  Patient is rocking in the bed secondary to complaints of severe abdominal pain Head:  Normocephalic  and atraumatic. Eyes:  Sclera clear, no icterus.   Conjunctiva pink. Ears:  Normal auditory acuity. Nose:  No deformity, discharge,  or lesions. Mouth:  No deformity or lesions.   Neck:  Supple; no masses or thyromegaly. Lungs:  Clear throughout to auscultation.   No wheezes, crackles, or rhonchi. Heart:  Regular rate and rhythm; no murmurs, clicks, rubs,  or gallops. Abdomen:  Soft, tender to very light touch of the abdomen, unable to appreciate focal tenderness, no rebound BS active,nonpalp mass or hsm.   Rectal: not done Msk:  Symmetrical without gross deformities. . Pulses:  Normal pulses noted. Extremities:  Without clubbing or edema. Neurologic:  Alert and  oriented x4; dyskinesia Skin:  Intact without significant lesions or rashes.. Psych:  Alert and cooperative. Normal mood and affect.  Intake/Output from previous day: No intake/output data recorded. Intake/Output this shift: No intake/output data recorded.  Lab Results: Recent Labs    01/28/22 1702  WBC 7.5  HGB 12.5  HCT 39.5  PLT 272   BMET Recent Labs    01/28/22 1702  NA 139  K 3.5  CL 98  CO2 25  GLUCOSE 92  BUN 7*  CREATININE 3.33*  CALCIUM 9.4   LFT Recent Labs    01/28/22 1702  PROT 7.8  ALBUMIN 4.0  AST 24  ALT 13  ALKPHOS 97  BILITOT 0.5   PT/INR No results for input(s): "LABPROT", "INR" in the last 72 hours. Hepatitis Panel No results for input(s): "HEPBSAG", "HCVAB", "HEPAIGM", "HEPBIGM" in the last 72 hours.  IMPRESSION:  #52 66 year old African-American female with history of chronic EtOH and polysubstance abuse, admitted with acute abdominal pain and nausea.  Etiology of pain is not entirely clear at this time though I suspect there may be a component of withdrawal and/or drug-seeking behavior.  She has had multiple doses of IV narcotics since arrival to the emergency room and continues to ask for pain medication.  CT imaging and ultrasound both show patient to be status  postcholecystectomy, on CT she has a CBD of 16 mm with mild intra and extrahepatic ductal dilation. LFTs are entirely normal, so it is unclear whether this has anything to do with her acute abdominal pain but she certainly does not have any evidence of acute biliary obstruction  #2 end-stage renal disease on dialysis  #3 bipolar disorder 4.  History of hypertension 5.  History of hepatitis C 6.  Tardive dyskinesia 7.  History of congestive heart failure diastolic  Plan; Clear liquid diet IV PPI twice daily She will need eventual MRI/MRCP I am not sure that she will ever be able to hold still because of the tardive dyskinesia, may need to consider sedating her for this  Pain management as per hospitalist.   Nicoletta Ba  01/29/2022, 10:13 AM   I have taken an interval history, thoroughly reviewed the chart and examined the patient. I reviewed with the Advanced Practitioner's note, impression and recommendations, and have recorded additional findings, impressions and recommendations below. I performed a substantive portion of this encounter (>50% time spent), including a complete performance of the medical decision making.  My additional thoughts are as follows:  Successful angioplasty of her right upper extremity AV fistula earlier today.  She had acute onset abdominal pain similar in presentation to her recent admission.  Fairly diffuse abdominal pain and she is exhibiting pain med seeking behavior.  She is not vomiting, has no fever or leukocytosis.  Imaging does not find evidence of vascular compromise, obstruction, pancreatitis, gallstones or other visible cause for this pain.  Recent EGD with some minor findings that did not appear to be causing this. She has tenderness to light palpation of the abdominal wall.  Postcholecystectomy biliary ductal dilatation with normal LFTs. I recommend against the MRI/MRCP as I do not think she has choledocholithiasis, and her frequent movement  would lead to a poor quality study that might give misleading results.  I wrote a diet order, I would caution against the use of opioids for this pain and she can be discharged when she is tolerating some food and liquids by mouth. No additional testing or treatments for recommended, inpatient GI service signing off.  Nelida Meuse III Office:[phone]

## 2022-01-30 DIAGNOSIS — R1013 Epigastric pain: Secondary | ICD-10-CM | POA: Diagnosis not present

## 2022-01-30 DIAGNOSIS — N186 End stage renal disease: Secondary | ICD-10-CM | POA: Diagnosis not present

## 2022-01-30 DIAGNOSIS — T82590A Other mechanical complication of surgically created arteriovenous fistula, initial encounter: Secondary | ICD-10-CM | POA: Diagnosis not present

## 2022-01-30 DIAGNOSIS — Z8659 Personal history of other mental and behavioral disorders: Secondary | ICD-10-CM | POA: Diagnosis not present

## 2022-01-30 LAB — RENAL FUNCTION PANEL
Albumin: 3.2 g/dL — ABNORMAL LOW (ref 3.5–5.0)
Anion gap: 14 (ref 5–15)
BUN: 20 mg/dL (ref 8–23)
CO2: 28 mmol/L (ref 22–32)
Calcium: 9 mg/dL (ref 8.9–10.3)
Chloride: 94 mmol/L — ABNORMAL LOW (ref 98–111)
Creatinine, Ser: 7.65 mg/dL — ABNORMAL HIGH (ref 0.44–1.00)
GFR, Estimated: 5 mL/min — ABNORMAL LOW (ref >60)
Glucose, Bld: 94 mg/dL (ref 70–99)
Phosphorus: 8.7 mg/dL — ABNORMAL HIGH (ref 2.5–4.6)
Potassium: 4 mmol/L (ref 3.5–5.1)
Sodium: 136 mmol/L (ref 135–145)

## 2022-01-30 LAB — HEPATITIS B SURFACE ANTIBODY, QUANTITATIVE: Hep B S AB Quant (Post): >1000 m[IU]/mL (ref >9.9)

## 2022-01-30 LAB — HIV ANTIBODY (ROUTINE TESTING W REFLEX): HIV Screen 4th Generation wRfx: NONREACTIVE

## 2022-01-30 LAB — CBC
HCT: 35.7 % — ABNORMAL LOW (ref 36.0–46.0)
Hemoglobin: 11.1 g/dL — ABNORMAL LOW (ref 12.0–15.0)
MCH: 27.3 pg (ref 26.0–34.0)
MCHC: 31.1 g/dL (ref 30.0–36.0)
MCV: 87.9 fL (ref 80.0–100.0)
Platelets: 262 10*3/uL (ref 150–400)
RBC: 4.06 MIL/uL (ref 3.87–5.11)
RDW: 20.5 % — ABNORMAL HIGH (ref 11.5–15.5)
WBC: 6.7 10*3/uL (ref 4.0–10.5)
nRBC: 0 % (ref 0.0–0.2)

## 2022-01-30 MED ORDER — PANTOPRAZOLE SODIUM 40 MG IV SOLR
40.0000 mg | Freq: Two times a day (BID) | INTRAVENOUS | Status: DC
  Administered 2022-01-30 – 2022-01-31 (×2): 40 mg via INTRAVENOUS
  Filled 2022-01-30 (×2): qty 10

## 2022-01-30 MED ORDER — BUPRENORPHINE HCL 2 MG SL SUBL
1.0000 mg | SUBLINGUAL_TABLET | Freq: Two times a day (BID) | SUBLINGUAL | Status: DC
  Administered 2022-01-30 – 2022-02-01 (×5): 1 mg via SUBLINGUAL
  Filled 2022-01-30 (×6): qty 1

## 2022-01-30 MED ORDER — HEPARIN SODIUM (PORCINE) 5000 UNIT/ML IJ SOLN
5000.0000 [IU] | Freq: Three times a day (TID) | INTRAMUSCULAR | Status: DC
  Administered 2022-01-30 – 2022-02-01 (×6): 5000 [IU] via SUBCUTANEOUS
  Filled 2022-01-30 (×6): qty 1

## 2022-01-30 MED ORDER — HEPARIN SODIUM (PORCINE) 1000 UNIT/ML DIALYSIS
2000.0000 [IU] | Freq: Once | INTRAMUSCULAR | Status: AC
  Administered 2022-01-30: 3000 [IU] via INTRAVENOUS_CENTRAL
  Filled 2022-01-30 (×2): qty 2

## 2022-01-30 MED ORDER — HEPARIN SODIUM (PORCINE) 1000 UNIT/ML IJ SOLN
2000.0000 [IU] | Freq: Once | INTRAMUSCULAR | Status: DC
Start: 2022-01-30 — End: 2022-01-30

## 2022-01-30 MED FILL — Heparin Sodium (Porcine) Inj 1000 Unit/ML: INTRAMUSCULAR | Qty: 10 | Status: AC

## 2022-01-30 NOTE — Progress Notes (Signed)
[facility]  PROGRESS NOTE  Sherry [name] HQI:[id number] DOB: 09-11-1955 DOA: 01/28/2022 PCP: Wynelle Fanny, DO   Brief HPI:   66 year old female with medical history of severe alcohol/polysubstance dependence, bipolar disorder, remote breast cancer, chronic back pain, chronic diastolic CHF, ESRD on HD, hypertension, hepatitis C, tardive dyskinesia, RLS presented with abdominal pain.  Patient states that she developed midepigastric abdominal pain which started during hemodialysis yesterday.  Lipase and LFTs were normal.CT showing progressive intra and extra hepatic biliary dilatation.  Ultrasound showing dilated common bile duct 12 mm but no stones.  Gastroenterology was consulted.  GI states that patient is s/p cholecystectomy on CT and has CBD of 16 mm with mild intra and extrahepatic ductal dilatation.  LFTs are normal.  No evidence of biliary obstruction.  No further interventions recommended.    Subjective   Patient seen, still complains of abdominal pain.   Assessment/Plan:   Abdominal pain -Presented with epigastric pain -CT abdomen/pelvis showed intra and extrahepatic biliary ductal dilatation -Abdominal ultrasound was performed which showed CBD dilation of up to 12 mm -LFTs were normal -GI was consulted, deemed that these findings to due to postcholecystectomy -No further recommendations except PPI twice daily -We will change Protonix IV to every 12 hours   ESRD on hemodialysis -Nephrology consulted -Difficulty with cannulation of fistula; underwent fistulogram and right basilic vein angioplasty in mid upper arm  Alcohol abuse/bipolar disorder/tardive dyskinesia -Continue home medications, fluoxetine Ingrezza  Hypertension -Continue isosorbide, labetalol, hydralazine      Medications     buprenorphine  1 mg Sublingual BID   Chlorhexidine Gluconate Cloth  6 each Topical Q0600   FLUoxetine  40 mg Oral q morning   gabapentin  400 mg Oral TID   heparin  injection (subcutaneous)  5,000 Units Subcutaneous Q8H   hydrALAZINE  50 mg Oral TID   isosorbide dinitrate  20 mg Oral TID   labetalol  200 mg Oral BID   pantoprazole (PROTONIX) IV  40 mg Intravenous Q24H   valbenazine  80 mg Oral QHS   zolpidem  5 mg Oral QHS     Data Reviewed:   CBG:  No results for input(s): "GLUCAP" in the last 168 hours.  SpO2: 94 % O2 Flow Rate (L/min): 0 L/min    Vitals:   01/30/22 1313 01/30/22 1314 01/30/22 1403 01/30/22 1700  BP: (!) 94/57  (!) 97/52 115/61  Pulse: 68  75 68  Resp: (!) '25  15 16  '$ Temp:   97.7 F (36.5 C)   TempSrc:   Axillary   SpO2: 96%  92% 94%  Weight:  75.8 kg    Height:          Data Reviewed:  Basic Metabolic Panel: Recent Labs  Lab 01/28/22 1702 01/30/22 1038  NA 139 136  K 3.5 4.0  CL 98 94*  CO2 25 28  GLUCOSE 92 94  BUN 7* 20  CREATININE 3.33* 7.65*  CALCIUM 9.4 9.0  PHOS  --  8.7*    CBC: Recent Labs  Lab 01/28/22 1702 01/30/22 1039  WBC 7.5 6.7  NEUTROABS 4.5  --   HGB 12.5 11.1*  HCT 39.5 35.7*  MCV 84.8 87.9  PLT 272 262    LFT Recent Labs  Lab 01/28/22 1702 01/30/22 1038  AST 24  --   ALT 13  --   ALKPHOS 97  --   BILITOT 0.5  --   PROT 7.8  --   ALBUMIN 4.0 3.2*  Antibiotics: Anti-infectives (From admission, onward)    None        DVT prophylaxis: Heparin  Code Status: Full code  Family Communication: No family at bedside   CONSULTS nephrology   Objective    Physical Examination:   General-appears in no acute distress Heart-S1-S2, regular, no murmur auscultated Lungs-clear to auscultation bilaterally, no wheezing or crackles auscultated Abdomen-soft, nontender, no organomegaly Extremities-no edema in the lower extremities Neuro-alert, oriented x3, no focal deficit noted  Status is: Inpatient:             Oswald Hillock   [facility] If 7PM-7AM, please contact night-coverage at [email or web address], Office   [phone]   01/30/2022, 6:27 PM  LOS: 1 day

## 2022-01-30 NOTE — Progress Notes (Signed)
I was present at this dialysis session. I have reviewed the session itself and made appropriate changes.   Filed Weights   01/30/22 0930 01/30/22 1001  Weight: 77.8 kg 74.2 kg    Recent Labs  Lab 01/28/22 1702  NA 139  K 3.5  CL 98  CO2 25  GLUCOSE 92  BUN 7*  CREATININE 3.33*  CALCIUM 9.4    Recent Labs  Lab 01/28/22 1702  WBC 7.5  NEUTROABS 4.5  HGB 12.5  HCT 39.5  MCV 84.8  PLT 272    Scheduled Meds:  buprenorphine  1 mg Sublingual BID   Chlorhexidine Gluconate Cloth  6 each Topical Q0600   FLUoxetine  40 mg Oral q morning   gabapentin  400 mg Oral TID   heparin  2,000 Units Dialysis Once in dialysis   heparin injection (subcutaneous)  5,000 Units Subcutaneous Q8H   hydrALAZINE  50 mg Oral TID   isosorbide dinitrate  20 mg Oral TID   labetalol  200 mg Oral BID   pantoprazole (PROTONIX) IV  40 mg Intravenous Q24H   valbenazine  80 mg Oral QHS   zolpidem  5 mg Oral QHS   Continuous Infusions: PRN Meds:.acetaminophen **OR** acetaminophen, albuterol, alteplase, calcium carbonate (dosed in mg elemental calcium), camphor-menthol **AND** hydrOXYzine, docusate sodium, fentaNYL (SUBLIMAZE) injection, heparin, hydrALAZINE, lidocaine (PF), lidocaine-prilocaine, LORazepam, melatonin, nicotine, ondansetron **OR** ondansetron (ZOFRAN) IV, pentafluoroprop-tetrafluoroeth, sorbitol, traMADol, zolpidem   Santiago Bumpers,  MD 01/30/2022, 10:43 AM

## 2022-01-30 NOTE — Progress Notes (Signed)
[facility] NP at bedside. This rn and HD tech informed NP of pt with weak thrill/positive bruit right arm fistula. Will access fistula per NP request. HD tech accessed fistula with 17 gauze needle, poor blood return noted. Fistula deaccessed by tech, NP aware, will proceed with using HD catheter for tx session.

## 2022-01-30 NOTE — Progress Notes (Signed)
Pt back to 4E05 from HD, resting comfortably. VS assessed, no complaints at this time.

## 2022-01-30 NOTE — Consult Note (Addendum)
[name] Renal Consultation Note    Indication for Consultation:  Management of ESRD/hemodialysis; anemia, hypertension/volume and secondary hyperparathyroidism PCP: Wynelle Fanny, DO  HPI: Sherry [name] is a 66 y.o. female with ESRD on hemodialysis MWF at [facility]. PMH: HTN, HFpEF EF  60-65% bipolar disorder, polysubstance abuse, treated hepatitis C, tardive dyskinesia,. SHPT, anemia. EDW recently lowered 01/26/2022.   Patient C/O abdominal pain post HD 01/28/2022. EMS was called and she was transported to hospital evaluation. She had scheduled f'gram scheduled 01/29/2022 already. No fever, chemistry drawn immediately post HD so inaccurate. WBC 7.5 HGB 12.5, ALT/AST WNL. CT of abd/pelvis without acute intra-abdominal pathology. She is S/P cholecystectomy. Progressive intra and extrahepatic biliary ductal dilation. She has been seen by GI. Planning for MRU/MRCP. She went for F'gram per Dr. Carlis Abbott 01/29/2022. S/P R Basilic vein PTA. AVF is ready for immediate use.   Seen on HD. Says she started having abdominal pain at HD center 01/28/2022. Asking for pain medications which will not be given as she has already rec'd AM BP medications. BP already low. Pre standing wt 77.8 kg. She is under OP EDW.  Past Medical History:  Diagnosis Date   Alcohol use disorder, severe, dependence ([place]) 08/27/2017   Anxiety    Body mass index (BMI) 40.0-44.9, adult ([place]) 07/19/2020   Breast cancer ([place])    right lumpectemy and lymph node    Cervical spondylosis 05/24/2020   Chronic bilateral low back pain with right-sided sciatica 05/24/2020   Chronic diastolic CHF (congestive heart failure) ([place]) 05/09/2019   Cigarette smoker 12/31/2017   ESRD (end stage renal disease) on dialysis ([place]) 08/22/2021   M-W-F   Essential hypertension 07/06/2017   GERD (gastroesophageal reflux disease)    Hepatitis C virus infection cured after antiviral drug therapy 12/17/2012   Telephone  Encounter - Dub Mikes, RN - 12/28/2016 9:54 AM EDT Patient called for HCV RNA test results. EOT 08-26-16 - not detected. 12 week post treatment 12-11-16 - not detected. Informed patient that she was cured. Patient verbalized understanding.          Hx of bipolar disorder 01/31/2016   Impaired functional mobility, balance, gait, and endurance 01/09/2021   MRSA infection    of breast incision   Neuroleptic-induced tardive dyskinesia 09/06/2017   Abilify   Opioid use disorder, severe, dependence ([place]) 08/27/2017   Restless legs syndrome 02/13/2013   Formatting of this note might be different from the original. IMPRESSION: Possible. Will follow.   Subacute dyskinesia due to drug 02/13/2013   Formatting of this note might be different from the original. STORY: Tardive dyskinesia and mild limb dyskinesia, LE>UE which likely due to prolonged antipsychotic used, abilify. Couldn't tolerate artane, gabapentin, clonazepam and xenazine.`E1o3L`IMPRESSION: Well tolerated depakote '250mg'$  bid, mood aspect is better as well. Will increase to '500mg'$  bid. ADR was discussed.  RTC 4-6 weeks.   TIA (transient ischemic attack) 2020   P/w BP >230/120 and neurologic symptoms, presumed TIA   Past Surgical History:  Procedure Laterality Date   A/V FISTULAGRAM Right 01/29/2022   Procedure: A/V Fistulagram;  Surgeon: Marty Heck, MD;  Location: [place] [facility];  Service: Cardiovascular;  Laterality: Right;   AV FISTULA PLACEMENT Right 05/01/2021   Procedure: RIGHT ARTERIOVENOUS (AV) FISTULA CREATION;  Surgeon: Broadus John, MD;  Location: [place];  Service: Vascular;  Laterality: Right;  PERIPHERAL NERVE BLOCK   BACK SURGERY  7416   [place] Right 09/02/2021   Procedure: RIGHT  SECOND STAGE BASILIC VEIN TRANSPOSITION;  Surgeon: Broadus John, MD;  Location: [place];  Service: Vascular;  Laterality: Right;  PERIPHERAL NERVE BLOCK   BREAST SURGERY Right 2010   "breast cancer survivor" - states  partial mastectomy and nodes   COLONOSCOPY     [facility]   ESOPHAGOGASTRODUODENOSCOPY     [facility]   ESOPHAGOGASTRODUODENOSCOPY  04/18/2020   [place] [facility]   LAPAROSCOPIC CHOLECYSTECTOMY  2002   LAPAROSCOPIC INCISIONAL / UMBILICAL / VENTRAL HERNIA REPAIR  04/13/2018   with BARD 15x 11BJ mesh (supraumbilical)   LAPAROSCOPIC LYSIS OF ADHESIONS  07/12/2018   Procedure: LAPAROSCOPIC LYSIS OF ADHESIONS;  Surgeon: Isabel Caprice, MD;  Location: [facility];  Service: Gynecology;;   MULTIPLE TOOTH EXTRACTIONS     MYOMECTOMY     x 2 prior to hysterectomy   PERIPHERAL VASCULAR BALLOON ANGIOPLASTY Right 01/29/2022   Procedure: PERIPHERAL VASCULAR BALLOON ANGIOPLASTY;  Surgeon: Marty Heck, MD;  Location: [place] [facility];  Service: Cardiovascular;  Laterality: Right;  arm fistula   ROBOTIC ASSISTED BILATERAL SALPINGO OOPHERECTOMY Right 07/12/2018   Procedure: XI ROBOTIC ASSISTED RIGHT SALPINGO OOPHORECTOMY;  Surgeon: Isabel Caprice, MD;  Location: [facility];  Service: Gynecology;  Laterality: Right;   TOTAL ABDOMINAL HYSTERECTOMY     fibroids    UPPER GASTROINTESTINAL ENDOSCOPY     WISDOM TOOTH EXTRACTION     Family History  Problem Relation Age of Onset   Heart attack Mother    Breast cancer Mother 66   Dementia Mother    Cancer Father 46       died of bleeding from kidneys   Colon cancer Neg Hx    Esophageal cancer Neg Hx    Stomach cancer Neg Hx    Rectal cancer Neg Hx    Social History:  reports that she has been smoking cigarettes. She has been smoking an average of .5 packs per day. She has never been exposed to tobacco smoke. She has never used smokeless tobacco. She reports that she does not currently use alcohol. She reports that she does not currently use drugs. Allergies  Allergen Reactions   Abilify [Aripiprazole] Other (See Comments)    Tardive dyskinesia Oral   Remeron [Mirtazapine] Other (See Comments)    Wgt stimulation /gain, Dizziness,  Patient says "can tolerate"   Trazodone And Nefazodone Other (See Comments)    Nightmares/sleep diturbance   Flexeril [Cyclobenzaprine] Other (See Comments)    Pt states Flexeril makes her feel depressed    Amoxicillin Diarrhea and Other (See Comments)    NOTE the patient has had PCN WITHOUT reaction Has patient had a PCN reaction causing immediate rash, facial/tongue/throat swelling, SOB or lightheadedness with hypotension: No Has patient had a PCN reaction causing severe rash involving mucus membranes or skin necrosis: No Has patient had a PCN reaction that required hospitalization: No Has patient had a PCN reaction occurring within the last 10 years: No If all of the above answers are "NO", then may proceed with Cephalosporin use.    Prior to Admission medications   Medication Sig Start Date End Date Taking? Authorizing Provider  albuterol (VENTOLIN HFA) 108 (90 Base) MCG/ACT inhaler Inhale 2 puffs into the lungs every 4 (four) hours as needed for wheezing or shortness of breath. 04/23/21   [provider]  BELBUCA 150 MCG FILM SMARTSIG:1 Strip(s) buccal Every 12 Hours 12/30/21   [provider]  BELBUCA 600 MCG FILM Take 1 Film by mouth  2 (two) times daily. 01/12/22   [provider]  FLUoxetine (PROZAC) 40 MG capsule Take 40 mg by mouth in the morning. 11/09/18   [provider]  gabapentin (NEURONTIN) 400 MG capsule Take 400 mg by mouth in the morning, at noon, and at bedtime. 12/28/21   [provider]  hydrALAZINE (APRESOLINE) 50 MG tablet Take 50 mg by mouth 3 (three) times daily. 03/24/21   [provider]  INGREZZA 80 MG CAPS Take 80 mg by mouth at bedtime. 10/15/17   [provider]  isosorbide dinitrate (ISORDIL) 20 MG tablet Take 20 mg by mouth 3 (three) times daily. 12/30/21   [provider]  labetalol (NORMODYNE) 200 MG tablet Take 200 mg by mouth 2 (two) times daily. 12/26/21   [provider]  labetalol  (NORMODYNE) 300 MG tablet Take 300 mg by mouth 2 (two) times daily.    [provider]  lidocaine (LIDODERM) 5 % Place 1 patch onto the skin daily. Remove & Discard patch within 12 hours or as directed by MD Patient taking differently: Place 1 patch onto the skin daily as needed (back pain). Remove & Discard patch within 12 hours or as directed by MD 12/27/21   Evlyn Courier, PA-C  lidocaine-prilocaine (EMLA) cream SMARTSIG:sparingly Topical As Directed 10/28/21   [provider]  Melatonin 10 MG TABS Take 10 mg by mouth at bedtime as needed (sleep).    [provider]  pantoprazole (PROTONIX) 40 MG tablet Take 40 mg by mouth daily. 04/05/21   [provider]  traMADol (ULTRAM) 50 MG tablet Take 50-100 mg by mouth 3 (three) times daily as needed for severe pain.    [provider]  vitamin C (ASCORBIC ACID) 500 MG tablet Take 500 mg by mouth daily.    [provider]  zolpidem (AMBIEN) 5 MG tablet Take 5 mg by mouth at bedtime. 01/05/22   [provider]   Current Facility-Administered Medications  Medication Dose Route Frequency Provider Last Rate Last Admin   acetaminophen (TYLENOL) tablet 650 mg  650 mg Oral Q6H PRN Karmen Bongo, MD       Or   acetaminophen (TYLENOL) suppository 650 mg  650 mg Rectal Q6H PRN Karmen Bongo, MD       albuterol (PROVENTIL) (2.5 MG/3ML) 0.083% nebulizer solution 2.5 mg  2.5 mg Nebulization Q2H PRN Karmen Bongo, MD       alteplase (CATHFLO ACTIVASE) injection 2 mg  2 mg Intracatheter Once PRN Adelfa Koh, NP       buprenorphine (SUBUTEX) SL tablet 1 mg  1 mg Sublingual BID Karmen Bongo, MD       calcium carbonate (dosed in mg elemental calcium) suspension 500 mg of elemental calcium  500 mg of elemental calcium Oral Q6H PRN Karmen Bongo, MD       camphor-menthol [facility]) lotion 1 Application  1 Application Topical K0U PRN Karmen Bongo, MD       And   hydrOXYzine (ATARAX) tablet 25 mg   25 mg Oral Q8H PRN Karmen Bongo, MD       Chlorhexidine Gluconate Cloth 2 % PADS 6 each  6 each Topical Q0600 Adelfa Koh, NP   6 each at 01/30/22 0549   docusate sodium (ENEMEEZ) enema 283 mg  1 enema Rectal PRN Karmen Bongo, MD       fentaNYL (SUBLIMAZE) injection 25 mcg  25 mcg Intravenous Q2H PRN Karmen Bongo, MD   25 mcg at 01/29/22  1406   FLUoxetine (PROZAC) capsule 40 mg  40 mg Oral q morning Karmen Bongo, MD   40 mg at 01/30/22 0845   gabapentin (NEURONTIN) capsule 400 mg  400 mg Oral TID Karmen Bongo, MD   400 mg at 01/30/22 0844   heparin injection 1,000 Units  1,000 Units Dialysis PRN Adelfa Koh, NP       heparin injection 2,000 Units  2,000 Units Dialysis Once in dialysis Reesa Chew, MD       heparin injection 5,000 Units  5,000 Units Subcutaneous Q8H Darrick Meigs, Marge Duncans, MD       hydrALAZINE (APRESOLINE) injection 5 mg  5 mg Intravenous Q4H PRN Karmen Bongo, MD       hydrALAZINE (APRESOLINE) tablet 50 mg  50 mg Oral TID Karmen Bongo, MD   50 mg at 01/30/22 0844   isosorbide dinitrate (ISORDIL) tablet 20 mg  20 mg Oral TID Karmen Bongo, MD   20 mg at 01/30/22 0845   labetalol (NORMODYNE) tablet 200 mg  200 mg Oral BID Karmen Bongo, MD   200 mg at 01/30/22 0846   lidocaine (PF) (XYLOCAINE) 1 % injection 5 mL  5 mL Intradermal PRN Adelfa Koh, NP       lidocaine-prilocaine (EMLA) cream 1 Application  1 Application Topical PRN Adelfa Koh, NP       LORazepam (ATIVAN) injection 1 mg  1 mg Intravenous Once PRN Karmen Bongo, MD       melatonin tablet 10 mg  10 mg Oral QHS PRN Karmen Bongo, MD   10 mg at 01/29/22 2135   nicotine (NICODERM CQ - dosed in mg/24 hours) patch 14 mg  14 mg Transdermal Daily PRN Karmen Bongo, MD   14 mg at 01/30/22 0844   ondansetron (ZOFRAN) tablet 4 mg  4 mg Oral Q6H PRN Karmen Bongo, MD       Or   ondansetron [facility]) injection 4 mg  4 mg Intravenous Q6H PRN Karmen Bongo, MD        pantoprazole (PROTONIX) injection 40 mg  40 mg Intravenous Q24H E[place], Amy S, PA-C   40 mg at 01/29/22 1455   pentafluoroprop-tetrafluoroeth (GEBAUERS) aerosol 1 Application  1 Application Topical PRN Adelfa Koh, NP       sorbitol 70 % solution 30 mL  30 mL Oral PRN Karmen Bongo, MD       traMADol Veatrice Bourbon) tablet 50-100 mg  50-100 mg Oral TID PRN Karmen Bongo, MD   100 mg at 01/30/22 0845   valbenazine (INGREZZA) capsule 80 mg  80 mg Oral Ivery Quale, MD   80 mg at 01/29/22 2136   zolpidem (AMBIEN) tablet 5 mg  5 mg Oral QHS PRN Karmen Bongo, MD       zolpidem Lorrin Mais) tablet 5 mg  5 mg Oral Ivery Quale, MD   5 mg at 01/29/22 2142   Labs: Basic Metabolic Panel: Recent Labs  Lab 01/28/22 1702  NA 139  K 3.5  CL 98  CO2 25  GLUCOSE 92  BUN 7*  CREATININE 3.33*  CALCIUM 9.4   Liver Function Tests: Recent Labs  Lab 01/28/22 1702  AST 24  ALT 13  ALKPHOS 97  BILITOT 0.5  PROT 7.8  ALBUMIN 4.0   Recent Labs  Lab 01/28/22 1702  LIPASE 41   No results for input(s): "AMMONIA" in the last 168 hours. CBC: Recent Labs  Lab 01/28/22 1702  WBC 7.5  NEUTROABS  4.5  HGB 12.5  HCT 39.5  MCV 84.8  PLT 272   Cardiac Enzymes: No results for input(s): "CKTOTAL", "CKMB", "CKMBINDEX", "TROPONINI" in the last 168 hours. CBG: No results for input(s): "GLUCAP" in the last 168 hours. Iron Studies: No results for input(s): "IRON", "TIBC", "TRANSFERRIN", "FERRITIN" in the last 72 hours. Studies/Results: PERIPHERAL VASCULAR CATHETERIZATION  Result Date: 01/29/2022  OPERATIVE NOTE   PROCEDURE: right brachiobasilic arteriovenous fistula cannulation under ultrasound guidance right arm fistulogram including central venogram right basilic vein angioplasty in the mid upper arm (5 mm x 80 mm Mustang, 6 mm x 100 mm Mustang, and 7 mm x 80 mm Mustang)  PRE-OPERATIVE DIAGNOSIS: Malfunctioning right arm arteriovenous fistula  POST-OPERATIVE DIAGNOSIS: same as  above  SURGEON: Marty Heck, MD  ANESTHESIA: local  ESTIMATED BLOOD LOSS: 5 cc  FINDING(S): Difficult to visualize central venous system given catheter and poor compliance of patient on the table.  The mid upper arm basilic vein had two stenotic areas one greater than 99% and the other well over 70%.  Both of these were crossed with antegrade access and angioplastied with a 5 mm, 6 mm and 7 mm angioplasty balloon.  Patient had a pulsatile fistula at the start of the case and now has an excellent thrill.  Excellent results and fistula is available for use immediately.  SPECIMEN(S):  None  CONTRAST: 65 mL  INDICATIONS: Charidy Cappelletti is a 66 y.o. female who presents with malfunctioning right brachiobasilic arteriovenous fistula.  The patient is scheduled for right arm fistuloram.  The patient is aware the risks include but are not limited to: bleeding, infection, thrombosis of the cannulated access, and possible anaphylactic reaction to the contrast.  The patient is aware of the risks of the procedure and elects to proceed forward.  DESCRIPTION: After full informed written consent was obtained, the patient was brought back to the angiography suite and placed supine upon the angiography table.  The patient was connected to monitoring equipment.  The right arm was prepped and draped in the standard fashion for a right arm fistulogram.  Under ultrasound guidance, the right arm arteriovenous fistula was evaluated with ultrasound, it was patent, an image was saved.  It was cannulated with a micropuncture needle.  The microwire was advanced into the fistula and the needle was exchanged for the a microsheath, which was lodged 2 cm into the access.  The wire was removed and the sheath was connected to the IV extension tubing.  Hand injections were completed to image the access from the antecubitum up to the level of axilla.  The central venous structures were also imaged by hand injections.  We elected to perform  intervention.  A Bentson wire was used to cross the stenosis in the mid upper arm antegrade.  Patient was given 3000 units of IV heparin.  We exchanged for a short 6 [place] sheath.  We then angioplastied these lesions in the basilic vein with a 5 mm, 6 mm and 7 mm Mustang to nominal pressure for 2 minutes at each lesion.  At completion there was no significant residual stenosis with a excellent thrill.  A 4-0 Monocryl purse-string suture was sewn around the sheath.  The sheath was removed while tying down the suture.  A sterile bandage was applied to the puncture site.  COMPLICATIONS: None  CONDITION: Stable  Marty Heck, MD [facility] Office: [phone]   US Abdomen Limited RUQ (LIVER/GB)  Result Date: 01/29/2022 CLINICAL  DATA:  Abnormal CT. EXAM: ULTRASOUND ABDOMEN LIMITED RIGHT UPPER QUADRANT COMPARISON:  Abdominopelvic CT earlier today. FINDINGS: Gallbladder: Surgically absent. Common bile duct: Diameter: 12 mm at the porta hepatis. No visualized choledocholithiasis, although the distal common bile duct is not well evaluated on the current exam. Liver: The intrahepatic biliary ductal dilatation on CT is not well seen. Mild heterogeneous and increased parenchymal echogenicity. Portal vein is patent on color Doppler imaging with normal direction of blood flow towards the liver. Other: Technically limited and challenging exam due to patient's medical condition, difficulty tolerating the exam. IMPRESSION: 1. Post cholecystectomy. Common bile duct dilatation, 12 mm at the porta hepatis. The intrahepatic biliary ductal dilatation on CT is not well-defined. There is no visualized choledocholithiasis, although the distal common bile duct is obscured on the current exam. Recommend correlation with LFTs. MRCP is not recommended at this time given patient is unable to tolerate ultrasound, node not tolerate holding still for MRI. 2. Suggestion of mild hepatic steatosis.  Electronically Signed   By: Keith Rake M.D.   On: 01/29/2022 00:33   CT ABDOMEN PELVIS W CONTRAST  Result Date: 01/28/2022 CLINICAL DATA:  Nausea/vomiting. Abdominal pain, acute, nonlocalized EXAM: CT ABDOMEN AND PELVIS WITH CONTRAST TECHNIQUE: Multidetector CT imaging of the abdomen and pelvis was performed using the standard protocol following bolus administration of intravenous contrast. RADIATION DOSE REDUCTION: This exam was performed according to the departmental dose-optimization program which includes automated exposure control, adjustment of the mA and/or kV according to patient size and/or use of iterative reconstruction technique. CONTRAST:  124m OMNIPAQUE IOHEXOL 300 MG/ML  SOLN COMPARISON:  12/22/2021 FINDINGS: Lower chest: Visualized lung bases are clear. Cardiac size within normal limits. Mild left ventricular hypertrophy noted. Central venous catheter tip noted within the superior right atrium. No pericardial effusion peer Hepatobiliary: Status post cholecystectomy. Mild intra and moderate extrahepatic biliary ductal dilation appears slightly progressive since prior examination with the extrahepatic bile duct measuring up to 16 mm in diameter. While this may represent post cholecystectomy change, distal obstructing process is not excluded on this limited examination. 9 mm hypodensity within the right hepatic lobe, axial image # 14/3 is indeterminate but may represent a tiny cyst or hemangioma in a patient without a history of malignancy. The liver is otherwise unremarkable. Pancreas: Unremarkable Spleen: Unremarkable Adrenals/Urinary Tract: Stable 21 mm left adrenal nodule better characterized as a benign adrenal adenoma on prior noncontrast CT examination of 11/18/2019. The kidneys are markedly atrophic bilaterally with numerous simple cysts seen bilaterally in keeping with cystic change of chronic renal failure. No follow-up imaging is recommended for these lesions. No definite enhancing  intrarenal mass is identified. No hydronephrosis. No intrarenal or ureteral calculi. The bladder is unremarkable. Stomach/Bowel: Mild sigmoid diverticulosis. Stomach, small bowel, and large bowel are otherwise unremarkable. Appendix normal. No free intraperitoneal gas or fluid. Vascular/Lymphatic: Moderate aortoiliac atherosclerotic calcification. No aortic aneurysm. No pathologic adenopathy within the abdomen and pelvis. Reproductive: Status post hysterectomy. No adnexal masses. Other: Umbilical hernia repair with mesh has been performed. There is diastasis of the rectus abdominus musculature with protuberance in the umbilical region without recurrent frank hernia. The rectum is unremarkable. Musculoskeletal: Advanced degenerative changes are noted within the right hip. L4-5 lumbar fusion with instrumentation has been performed no acute bone abnormality. No lytic or blastic bone lesion. IMPRESSION: 1. No acute intra-abdominal pathology identified. No definite radiographic explanation for the patient's reported symptoms. 2. Status post cholecystectomy. Progressive intra and extrahepatic biliary ductal dilation. While this may represent  post cholecystectomy change, distal obstructing process is not excluded on this limited examination. Correlation with liver enzymes may be helpful for further evaluation. 3. Stable left adrenal adenoma. 4. Mild sigmoid diverticulosis. 5. Status post umbilical hernia repair with mesh. Diastasis of the rectus abdominus musculature with protuberance in the umbilical region without recurrent frank hernia. Aortic Atherosclerosis (ICD10-I70.0). Electronically Signed   By: Fidela Salisbury M.D.   On: 01/28/2022 20:57    ROS: As per HPI otherwise negative.   Physical Exam: Vitals:   01/30/22 0408 01/30/22 0903 01/30/22 0930 01/30/22 1001  BP: (!) 112/52 (!) 117/55 (!) 95/53   Pulse: 74 73 69   Resp: '15 18 16   '$ Temp: 98.6 F (37 C) 98.3 F (36.8 C) 100.2 F (37.9 C)   TempSrc:  Oral Oral    SpO2: 94% 93% 97%   Weight:    74.2 kg  Height:         General: Overweight older female in no acute distress. Head: Normocephalic, atraumatic, sclera non-icteric, mucus membranes are moist Neck: Supple. JVD not elevated. Lungs: Clear bilaterally to auscultation without wheezes, rales, or rhonchi. Breathing is unlabored. Heart: RRR with S1 S2. No murmurs, rubs, or gallops appreciated. Abdomen: Soft, non-tender, non-distended with normoactive bowel sounds. No rebound/guarding. No obvious abdominal masses. M-S:  Strength and tone appear normal for age. Lower extremities:without edema or ischemic changes, no open wounds  Neuro: Has tardive dyskinesia. Poor historian. Answers some questions appropriately.  Dialysis Access: AVF sutures intact + edema. Faint T/B RIJ TDC drsg needs changing.   Dialysis Orders: Center: [facility] MWF 3.5 hrs 180NRe 450/500 79 kg 2.0 K/ 2.0 Ca UFP 2 AVF/TDC -Heparin 2000 units IV TIW -Hectorol 7 mcg IV TIW -Mircera 50 mcg IV q 2 weeks (last dose 01/26/2022) -Venofer 50 mg IV weekly - Assessment/Plan: Abdominal pain: W/U per primary/GI has been consulted.  Issues with AVF-went for schedule F'gram S/P PTCA of basilic vein. Ready for use post procedure however unable to cannulate today. Using [facility].   ESRD -  MWF HD today on schedule. Next HD 02/02/2022.   Hypertension/volume  - Unfortunately rec'd hydralazine, labetalol, isosorbide prior to HD. SBP in 90s. Fortunately no evidence of volume overload but under newly lowered EDW. Will need lower EDW on discharge. UF as tolerated today.   Anemia  - HGB 12.5. Recent ESA dose. Follow HGB  Metabolic bone disease -  Not on binders. Continue VDRA.   Nutrition - Albumin at goal. Renal diet fluid restrictions. H/O polysubstance abuse. Pain medications per primary ONLY.   Kamrin Spath H. Owens Shark, NP-C 01/30/2022, 10:15 AM  [employer] [phone]

## 2022-01-31 DIAGNOSIS — N186 End stage renal disease: Secondary | ICD-10-CM | POA: Diagnosis not present

## 2022-01-31 DIAGNOSIS — R1013 Epigastric pain: Secondary | ICD-10-CM | POA: Diagnosis not present

## 2022-01-31 DIAGNOSIS — T82590A Other mechanical complication of surgically created arteriovenous fistula, initial encounter: Secondary | ICD-10-CM | POA: Diagnosis not present

## 2022-01-31 DIAGNOSIS — Z8659 Personal history of other mental and behavioral disorders: Secondary | ICD-10-CM | POA: Diagnosis not present

## 2022-01-31 MED ORDER — PANTOPRAZOLE SODIUM 40 MG PO TBEC
40.0000 mg | DELAYED_RELEASE_TABLET | Freq: Two times a day (BID) | ORAL | Status: DC
  Administered 2022-01-31 – 2022-02-01 (×2): 40 mg via ORAL
  Filled 2022-01-31 (×2): qty 1

## 2022-01-31 NOTE — Progress Notes (Signed)
[facility]  PROGRESS NOTE  Sherry [name] NUU:[id number] DOB: Oct 07, 1955 DOA: 01/28/2022 PCP: Wynelle Fanny, DO   Brief HPI:   66 year old female with medical history of severe alcohol/polysubstance dependence, bipolar disorder, remote breast cancer, chronic back pain, chronic diastolic CHF, ESRD on HD, hypertension, hepatitis C, tardive dyskinesia, RLS presented with abdominal pain.  Patient states that she developed midepigastric abdominal pain which started during hemodialysis yesterday.  Lipase and LFTs were normal.CT showing progressive intra and extra hepatic biliary dilatation.  Ultrasound showing dilated common bile duct 12 mm but no stones.  Gastroenterology was consulted.  GI states that patient is s/p cholecystectomy on CT and has CBD of 16 mm with mild intra and extrahepatic ductal dilatation.  LFTs are normal.  No evidence of biliary obstruction.  No further interventions recommended.    Subjective   Patient seen and examined, still complains of  abdominal pain.   Assessment/Plan:   Abdominal pain -Presented with epigastric pain -CT abdomen/pelvis showed intra and extrahepatic biliary ductal dilatation -Abdominal ultrasound was performed which showed CBD dilation of up to 12 mm -LFTs were normal -GI was consulted, deemed that these findings to due to postcholecystectomy -No further recommendations except PPI twice daily -We will change Protonix 40 mg p.o. twice daily   ESRD on hemodialysis -Nephrology consulted -Difficulty with cannulation of fistula; underwent fistulogram and right basilic vein angioplasty in mid upper arm  Alcohol abuse/bipolar disorder/tardive dyskinesia -Continue home medications, fluoxetine Ingrezza  Hypertension -Continue isosorbide, labetalol, hydralazine      Medications     buprenorphine  1 mg Sublingual BID   Chlorhexidine Gluconate Cloth  6 each Topical Q0600   FLUoxetine  40 mg Oral q morning   gabapentin  400 mg Oral TID    heparin injection (subcutaneous)  5,000 Units Subcutaneous Q8H   hydrALAZINE  50 mg Oral TID   isosorbide dinitrate  20 mg Oral TID   pantoprazole  40 mg Oral BID   valbenazine  80 mg Oral QHS   zolpidem  5 mg Oral QHS     Data Reviewed:   CBG:  No results for input(s): "GLUCAP" in the last 168 hours.  SpO2: 100 % O2 Flow Rate (L/min): 0 L/min    Vitals:   01/31/22 0030 01/31/22 0417 01/31/22 0928 01/31/22 1215  BP: 124/64 104/62 91/72 106/64  Pulse: 77 68 69 64  Resp: '20 16 16 16  '$ Temp: 98.6 F (37 C) 98.5 F (36.9 C) 99.2 F (37.3 C) 99.1 F (37.3 C)  TempSrc: Oral Oral Oral Oral  SpO2: 93% 93% 93% 100%  Weight:  75.8 kg    Height:          Data Reviewed:  Basic Metabolic Panel: Recent Labs  Lab 01/28/22 1702 01/30/22 1038  NA 139 136  K 3.5 4.0  CL 98 94*  CO2 25 28  GLUCOSE 92 94  BUN 7* 20  CREATININE 3.33* 7.65*  CALCIUM 9.4 9.0  PHOS  --  8.7*    CBC: Recent Labs  Lab 01/28/22 1702 01/30/22 1039  WBC 7.5 6.7  NEUTROABS 4.5  --   HGB 12.5 11.1*  HCT 39.5 35.7*  MCV 84.8 87.9  PLT 272 262    LFT Recent Labs  Lab 01/28/22 1702 01/30/22 1038  AST 24  --   ALT 13  --   ALKPHOS 97  --   BILITOT 0.5  --   PROT 7.8  --   ALBUMIN 4.0 3.2*  Antibiotics: Anti-infectives (From admission, onward)    None        DVT prophylaxis: Heparin  Code Status: Full code  Family Communication: No family at bedside   CONSULTS nephrology   Objective    Physical Examination:  General-appears in no acute distress Heart-S1-S2, regular, no murmur auscultated Lungs-clear to auscultation bilaterally, no wheezing or crackles auscultated Abdomen-soft, mild generalized tenderness to palpation,  no organomegaly Extremities-no edema in the lower extremities Neuro-alert, oriented x3, no focal deficit noted   Status is: Inpatient: Likely discharge home in a.m.           Oswald Hillock   [facility] If 7PM-7AM, please  contact night-coverage at [email or web address], Office  [phone]   01/31/2022, 1:42 PM  LOS: 2 days

## 2022-01-31 NOTE — Progress Notes (Signed)
Nephrology Follow-Up Consult note   Assessment/Recommendations: Sherry [name] is a/an 66 y.o. female with a past medical history significant for ESRD, admitted for Abd pain.       Dialysis Orders: Center: [facility] MWF 3.5 hrs 180NRe 450/500 79 kg 2.0 K/ 2.0 Ca UFP 2 AVF/TDC -Heparin 2000 units IV TIW -Hectorol 7 mcg IV TIW -Mircera 50 mcg IV q 2 weeks (last dose 01/26/2022) -Venofer 50 mg IV weekly - Assessment/Plan: Abdominal pain: GI has seen. Some CBD dilation but felt to be normal post-cholecystectomy. Mgmt per primary Issues with AVF-went for schedule F'gram S/P PTCA of basilic vein. Ready for use post procedure however unable to cannulate 7/21. Using [facility] Will keep trying for AVF  ESRD -  MWF HD today on schedule. Next HD 02/02/2022.   Hypertension/volume  - Will need lower EDW on discharge. UF as tolerated today.   Anemia  - HGB 11.1. Recent ESA dose. Follow HGB  Metabolic bone disease -  Not on binders but phos 8.7. Hold off on binders given abdominal issues but may need soon. Continue VDRA.  H/O polysubstance abuse. Pain medications per primary ONLY.    Recommendations conveyed to primary service.    [facility] 01/31/2022 8:13 AM  ___________________________________________________________  CC: Abdominal pain  Interval History/Subjective: Patient states that dialysis went terrible yesterday.  Sounds like may be the Tegaderm on her dialysis catheter was pulling on her skin but hard to ascertain the actual details.  Continues to have abdominal pain asking for pain medications.  No other complaints.   Medications:  Current Facility-Administered Medications  Medication Dose Route Frequency Provider Last Rate Last Admin   acetaminophen (TYLENOL) tablet 650 mg  650 mg Oral Q6H PRN Karmen Bongo, MD       Or   acetaminophen (TYLENOL) suppository 650 mg  650 mg Rectal Q6H PRN Karmen Bongo, MD       albuterol (PROVENTIL) (2.5 MG/3ML)  0.083% nebulizer solution 2.5 mg  2.5 mg Nebulization Q2H PRN Karmen Bongo, MD       buprenorphine (SUBUTEX) SL tablet 1 mg  1 mg Sublingual BID Karmen Bongo, MD   1 mg at 01/30/22 2256   calcium carbonate (dosed in mg elemental calcium) suspension 500 mg of elemental calcium  500 mg of elemental calcium Oral Q6H PRN Karmen Bongo, MD       camphor-menthol [facility]) lotion 1 Application  1 Application Topical D6Q PRN Karmen Bongo, MD       And   hydrOXYzine (ATARAX) tablet 25 mg  25 mg Oral Q8H PRN Karmen Bongo, MD       Chlorhexidine Gluconate Cloth 2 % PADS 6 each  6 each Topical Q0600 Adelfa Koh, NP   6 each at 01/31/22 2297   docusate sodium (ENEMEEZ) enema 283 mg  1 enema Rectal PRN Karmen Bongo, MD       fentaNYL (SUBLIMAZE) injection 25 mcg  25 mcg Intravenous Q2H PRN Karmen Bongo, MD   25 mcg at 01/29/22 1406   FLUoxetine (PROZAC) capsule 40 mg  40 mg Oral q morning Karmen Bongo, MD   40 mg at 01/30/22 0845   gabapentin (NEURONTIN) capsule 400 mg  400 mg Oral TID Karmen Bongo, MD   400 mg at 01/30/22 2256   heparin injection 5,000 Units  5,000 Units Subcutaneous Q8H Oswald Hillock, MD   5,000 Units at 01/31/22 9892   hydrALAZINE (APRESOLINE) injection 5 mg  5 mg Intravenous Q4H PRN Lorin Mercy,  Anderson Malta, MD       hydrALAZINE (APRESOLINE) tablet 50 mg  50 mg Oral TID Karmen Bongo, MD   50 mg at 01/30/22 0844   isosorbide dinitrate (ISORDIL) tablet 20 mg  20 mg Oral TID Karmen Bongo, MD   20 mg at 01/30/22 2256   labetalol (NORMODYNE) tablet 200 mg  200 mg Oral BID Karmen Bongo, MD   200 mg at 01/30/22 2257   LORazepam (ATIVAN) injection 1 mg  1 mg Intravenous Once PRN Karmen Bongo, MD       melatonin tablet 10 mg  10 mg Oral QHS PRN Karmen Bongo, MD   10 mg at 01/29/22 2135   nicotine (NICODERM CQ - dosed in mg/24 hours) patch 14 mg  14 mg Transdermal Daily PRN Karmen Bongo, MD   14 mg at 01/30/22 0844   ondansetron (ZOFRAN) tablet 4 mg  4 mg  Oral Q6H PRN Karmen Bongo, MD       Or   ondansetron [facility]) injection 4 mg  4 mg Intravenous Q6H PRN Karmen Bongo, MD   4 mg at 01/30/22 1104   pantoprazole (PROTONIX) injection 40 mg  40 mg Intravenous Q12H Oswald Hillock, MD   40 mg at 01/30/22 2256   sorbitol 70 % solution 30 mL  30 mL Oral PRN Karmen Bongo, MD       traMADol Veatrice Bourbon) tablet 50-100 mg  50-100 mg Oral TID PRN Karmen Bongo, MD   100 mg at 01/30/22 1954   valbenazine (INGREZZA) capsule 80 mg  80 mg Oral Ivery Quale, MD   80 mg at 01/30/22 2257   zolpidem (AMBIEN) tablet 5 mg  5 mg Oral QHS PRN Karmen Bongo, MD       zolpidem Lorrin Mais) tablet 5 mg  5 mg Oral QHS Karmen Bongo, MD   5 mg at 01/30/22 2256      Review of Systems: 10 systems reviewed and negative except per interval history/subjective  Physical Exam: Vitals:   01/31/22 0030 01/31/22 0417  BP: 124/64 104/62  Pulse: 77 68  Resp: 20 16  Temp: 98.6 F (37 C) 98.5 F (36.9 C)  SpO2: 93% 93%   No intake/output data recorded.  Intake/Output Summary (Last 24 hours) at 01/31/2022 0813 Last data filed at 01/30/2022 1314 Gross per 24 hour  Intake --  Output 200 ml  Net -200 ml   Constitutional: lying in bed in nad ENMT: ears and nose without scars or lesions, MMM, tardive like mouth movements CV: normal rate, no edema Respiratory: bilateral chest rise, normal work of breathing Gastrointestinal: soft, tender to palpation diffusely, no palpable masses or hernias Skin: no visible lesions or rashes Psych: alert, appropriate mood and affect   Test Results I personally reviewed new and old clinical labs and radiology tests Lab Results  Component Value Date   NA 136 01/30/2022   K 4.0 01/30/2022   CL 94 (L) 01/30/2022   CO2 28 01/30/2022   BUN 20 01/30/2022   CREATININE 7.65 (H) 01/30/2022   CALCIUM 9.0 01/30/2022   ALBUMIN 3.2 (L) 01/30/2022   PHOS 8.7 (H) 01/30/2022    CBC Recent Labs  Lab 01/28/22 1702 01/30/22 1039   WBC 7.5 6.7  NEUTROABS 4.5  --   HGB 12.5 11.1*  HCT 39.5 35.7*  MCV 84.8 87.9  PLT 272 262

## 2022-02-01 DIAGNOSIS — R1013 Epigastric pain: Secondary | ICD-10-CM | POA: Diagnosis not present

## 2022-02-01 DIAGNOSIS — Z992 Dependence on renal dialysis: Secondary | ICD-10-CM | POA: Diagnosis not present

## 2022-02-01 DIAGNOSIS — Z8659 Personal history of other mental and behavioral disorders: Secondary | ICD-10-CM | POA: Diagnosis not present

## 2022-02-01 DIAGNOSIS — N186 End stage renal disease: Secondary | ICD-10-CM | POA: Diagnosis not present

## 2022-02-01 MED ORDER — PANTOPRAZOLE SODIUM 40 MG PO TBEC: 40.0000 mg | DELAYED_RELEASE_TABLET | Freq: Every day | ORAL | 1 refills | Status: DC

## 2022-02-01 NOTE — Progress Notes (Signed)
Nephrology Follow-Up Consult note   Assessment/Recommendations: Sherry [name] is a/an 66 y.o. female with a past medical history significant for ESRD, admitted for Abd pain.       Dialysis Orders: Center: [facility] MWF 3.5 hrs 180NRe 450/500 79 kg 2.0 K/ 2.0 Ca UFP 2 AVF/TDC -Heparin 2000 units IV TIW -Hectorol 7 mcg IV TIW -Mircera 50 mcg IV q 2 weeks (last dose 01/26/2022) -Venofer 50 mg IV weekly - Assessment/Plan: Abdominal pain: GI has seen. Some CBD dilation but felt to be normal post-cholecystectomy. Mgmt per primary Issues with AVF-went for schedule F'gram S/P PTCA of basilic vein. Ready for use post procedure however unable to cannulate 7/21. Using [facility]. Will keep trying for AVF  ESRD -  MWF. Next HD 02/02/2022.   Hypertension/volume  - Will need lower EDW on discharge. UF as tolerated  Anemia  - HGB 11.1. Recent ESA dose. Follow HGB  Metabolic bone disease -  Not on binders but phos 8.7. Hold off on binders given abdominal issues but may need soon. Continue VDRA.  H/O polysubstance abuse. Pain medications per primary ONLY.    Recommendations conveyed to primary service.    [facility] 02/01/2022 8:55 AM  ___________________________________________________________  CC: Abdominal pain  Interval History/Subjective: Patient resting in bed with no complaints today.  Has had abdominal pain over the last 24 hours.   Medications:  Current Facility-Administered Medications  Medication Dose Route Frequency Provider Last Rate Last Admin   acetaminophen (TYLENOL) tablet 650 mg  650 mg Oral Q6H PRN Karmen Bongo, MD       Or   acetaminophen (TYLENOL) suppository 650 mg  650 mg Rectal Q6H PRN Karmen Bongo, MD       albuterol (PROVENTIL) (2.5 MG/3ML) 0.083% nebulizer solution 2.5 mg  2.5 mg Nebulization Q2H PRN Karmen Bongo, MD       buprenorphine (SUBUTEX) SL tablet 1 mg  1 mg Sublingual BID Karmen Bongo, MD   1 mg at 01/31/22 2328    calcium carbonate (dosed in mg elemental calcium) suspension 500 mg of elemental calcium  500 mg of elemental calcium Oral Q6H PRN Karmen Bongo, MD       camphor-menthol [facility]) lotion 1 Application  1 Application Topical V7B PRN Karmen Bongo, MD       And   hydrOXYzine (ATARAX) tablet 25 mg  25 mg Oral Q8H PRN Karmen Bongo, MD       Chlorhexidine Gluconate Cloth 2 % PADS 6 each  6 each Topical Q0600 Adelfa Koh, NP   6 each at 01/31/22 9390   docusate sodium (ENEMEEZ) enema 283 mg  1 enema Rectal PRN Karmen Bongo, MD       fentaNYL (SUBLIMAZE) injection 25 mcg  25 mcg Intravenous Q2H PRN Karmen Bongo, MD   25 mcg at 01/29/22 1406   FLUoxetine (PROZAC) capsule 40 mg  40 mg Oral q morning Karmen Bongo, MD   40 mg at 01/31/22 1046   gabapentin (NEURONTIN) capsule 400 mg  400 mg Oral TID Karmen Bongo, MD   400 mg at 01/31/22 2327   heparin injection 5,000 Units  5,000 Units Subcutaneous Q8H Oswald Hillock, MD   5,000 Units at 02/01/22 3009   hydrALAZINE (APRESOLINE) injection 5 mg  5 mg Intravenous Q4H PRN Karmen Bongo, MD       hydrALAZINE (APRESOLINE) tablet 50 mg  50 mg Oral TID Karmen Bongo, MD   50 mg at 01/31/22 2327   isosorbide dinitrate (  ISORDIL) tablet 20 mg  20 mg Oral TID Karmen Bongo, MD   20 mg at 01/31/22 2325   LORazepam (ATIVAN) injection 1 mg  1 mg Intravenous Once PRN Karmen Bongo, MD       melatonin tablet 10 mg  10 mg Oral QHS PRN Karmen Bongo, MD   10 mg at 01/29/22 2135   nicotine (NICODERM CQ - dosed in mg/24 hours) patch 14 mg  14 mg Transdermal Daily PRN Karmen Bongo, MD   14 mg at 01/30/22 0844   ondansetron (ZOFRAN) tablet 4 mg  4 mg Oral Q6H PRN Karmen Bongo, MD       Or   ondansetron [facility]) injection 4 mg  4 mg Intravenous Q6H PRN Karmen Bongo, MD   4 mg at 01/30/22 1104   pantoprazole (PROTONIX) EC tablet 40 mg  40 mg Oral BID Oswald Hillock, MD   40 mg at 01/31/22 2325   sorbitol 70 % solution 30 mL  30 mL Oral PRN  Karmen Bongo, MD       traMADol Veatrice Bourbon) tablet 50-100 mg  50-100 mg Oral TID PRN Karmen Bongo, MD   100 mg at 01/31/22 1218   valbenazine (INGREZZA) capsule 80 mg  80 mg Oral Ivery Quale, MD   80 mg at 01/31/22 2325   zolpidem (AMBIEN) tablet 5 mg  5 mg Oral QHS PRN Karmen Bongo, MD   5 mg at 01/31/22 2326   zolpidem (AMBIEN) tablet 5 mg  5 mg Oral Ivery Quale, MD   5 mg at 01/30/22 2256      Review of Systems: 10 systems reviewed and negative except per interval history/subjective  Physical Exam: Vitals:   02/01/22 0051 02/01/22 0336  BP: (!) 117/52 (!) 106/50  Pulse: 73 72  Resp: 15 13  Temp: 98.9 F (37.2 C) 98.6 F (37 C)  SpO2: 96% 96%   No intake/output data recorded. No intake or output data in the 24 hours ending 02/01/22 0855  Constitutional: lying in bed in nad ENMT: ears and nose without scars or lesions, MMM CV: normal rate, no edema Respiratory: bilateral chest rise, normal work of breathing Gastrointestinal: soft, tender to palpation diffusely, no palpable masses or hernias Skin: no visible lesions or rashes   Test Results I personally reviewed new and old clinical labs and radiology tests Lab Results  Component Value Date   NA 136 01/30/2022   K 4.0 01/30/2022   CL 94 (L) 01/30/2022   CO2 28 01/30/2022   BUN 20 01/30/2022   CREATININE 7.65 (H) 01/30/2022   CALCIUM 9.0 01/30/2022   ALBUMIN 3.2 (L) 01/30/2022   PHOS 8.7 (H) 01/30/2022    CBC Recent Labs  Lab 01/28/22 1702 01/30/22 1039  WBC 7.5 6.7  NEUTROABS 4.5  --   HGB 12.5 11.1*  HCT 39.5 35.7*  MCV 84.8 87.9  PLT 272 262

## 2022-02-01 NOTE — Discharge Summary (Signed)
Physician Discharge Summary   Patient: Sherry [name] MRN: [id number] DOB: 11-16-1955  Admit date:     01/28/2022  Discharge date: 02/01/22  Discharge Physician: Oswald Hillock   PCP: Sherry Fanny, Sherry [name]   Recommendations at discharge:   Follow-up PCP in 2 weeks  Discharge Diagnoses: Principal Problem:   Abdominal pain Active Problems:   Hx of bipolar disorder   Opioid use disorder, severe, dependence (HCC)   Substance abuse (HCC)   Hyperkalemia   Tardive dyskinesia   ESRD on dialysis [facility])   Dialysis AV fistula malfunction, initial encounter ([place])  Resolved Problems:   * No resolved hospital problems. *  Hospital Course: 66 year old female with medical history of severe alcohol/polysubstance dependence, bipolar disorder, remote breast cancer, chronic back pain, chronic diastolic CHF, ESRD on HD, hypertension, hepatitis C, tardive dyskinesia, RLS presented with abdominal pain.  Patient states that she developed midepigastric abdominal pain which started during hemodialysis yesterday.  Lipase and LFTs were normal.CT showing progressive intra and extra hepatic biliary dilatation.  Ultrasound showing dilated common bile duct 12 mm but no stones.  Gastroenterology was consulted.  GI states that patient is s/p cholecystectomy on CT and has CBD of 16 mm with mild intra and extrahepatic ductal dilatation.  LFTs are normal.  No evidence of biliary obstruction.  No further interventions recommended.  Assessment and Plan:  Abdominal pain -Presented with epigastric pain -CT abdomen/pelvis showed intra and extrahepatic biliary ductal dilatation -Abdominal ultrasound was performed which showed CBD dilation of up to 12 mm -LFTs were normal -GI was consulted, deemed that these findings to due to postcholecystectomy; did not recommend MRI or MRCP abdomen at this time. -We will discharge her on Protonix 40 mg p.o. daily  ESRD on hemodialysis MWF -Nephrology consulted -Difficulty with cannulation of  fistula; underwent fistulogram and right basilic vein angioplasty in mid upper arm -Cleared by nephrology for discharge   Alcohol abuse/bipolar disorder/tardive dyskinesia -Continue home medications, fluoxetine Ingrezza   Hypertension -Continue isosorbide, labetalol, hydralazine         Consultants: Nephrology, gastroenterology Procedures performed:  Disposition: Home Diet recommendation:  Discharge Diet Orders (From admission, onward)     Start     Ordered   02/01/22 0000  Diet - low sodium heart healthy        02/01/22 1115           Regular diet DISCHARGE MEDICATION: Allergies as of 02/01/2022       Reactions   Abilify [aripiprazole] Other (See Comments)   Tardive dyskinesia Oral   Remeron [mirtazapine] Other (See Comments)   Wgt stimulation /gain, Dizziness, Patient says "can tolerate"   Trazodone And Nefazodone Other (See Comments)   Nightmares/sleep diturbance   Flexeril [cyclobenzaprine] Other (See Comments)   Pt states Flexeril makes her feel depressed    Amoxicillin Diarrhea, Other (See Comments)   NOTE the patient has had PCN WITHOUT reaction Has patient had a PCN reaction causing immediate rash, facial/tongue/throat swelling, SOB or lightheadedness with hypotension: No Has patient had a PCN reaction causing severe rash involving mucus membranes or skin necrosis: No Has patient had a PCN reaction that required hospitalization: No Has patient had a PCN reaction occurring within the last 10 years: No If all of the above answers are "NO", then may proceed with Cephalosporin use.        Medication List     TAKE these medications    Acetaminophen Extra Strength 500 MG tablet Generic drug: acetaminophen Take 500 mg by  mouth every 6 (six) hours as needed for pain.   albuterol 108 (90 Base) MCG/ACT inhaler Commonly known as: VENTOLIN HFA Inhale 2 puffs into the lungs every 4 (four) hours as needed for wheezing or shortness of breath.   Belbuca 600  MCG Film Generic drug: Buprenorphine HCl Take 1 Film by mouth 2 (two) times daily.   FLUoxetine 40 MG capsule Commonly known as: PROZAC Take 40 mg by mouth in the morning.   gabapentin 400 MG capsule Commonly known as: NEURONTIN Take 400 mg by mouth 2 (two) times daily.   hydrALAZINE 50 MG tablet Commonly known as: APRESOLINE Take 50 mg by mouth 3 (three) times daily.   Ingrezza 80 MG capsule Generic drug: valbenazine Take 80 mg by mouth at bedtime.   isosorbide dinitrate 20 MG tablet Commonly known as: ISORDIL Take 20 mg by mouth 3 (three) times daily.   labetalol 200 MG tablet Commonly known as: NORMODYNE Take 200 mg by mouth 2 (two) times daily. What changed: Another medication with the same name was removed. Continue taking this medication, and follow the directions you see here.   lidocaine 5 % Commonly known as: Lidoderm Place 1 patch onto the skin daily. Remove & Discard patch within 12 hours or as directed by MD What changed:  when to take this reasons to take this   lidocaine-prilocaine cream Commonly known as: EMLA SMARTSIG:sparingly Topical As Directed   Melatonin 10 MG Tabs Take 10 mg by mouth at bedtime as needed (sleep).   nitroGLYCERIN 0.4 MG SL tablet Commonly known as: NITROSTAT Place 0.4 mg under the tongue every 5 (five) minutes as needed for chest pain.   traMADol 50 MG tablet Commonly known as: ULTRAM Take 50-100 mg by mouth 3 (three) times daily as needed for severe pain.   zolpidem 5 MG tablet Commonly known as: AMBIEN Take 5 mg by mouth at bedtime.        Discharge Exam: Filed Weights   01/30/22 1314 01/31/22 0417 02/01/22 0336  Weight: 75.8 kg 75.8 kg 78.2 kg   General-appears in no acute distress Heart-S1-S2, regular, no murmur auscultated Lungs-clear to auscultation bilaterally, no wheezing or crackles auscultated Abdomen-soft, nontender, no organomegaly Extremities-no edema in the lower extremities Neuro-alert, oriented  x3, no focal deficit noted  Condition at discharge: good  The results of significant diagnostics from this hospitalization (including imaging, microbiology, ancillary and laboratory) are listed below for reference.   Imaging Studies: PERIPHERAL VASCULAR CATHETERIZATION  Result Date: 01/29/2022  OPERATIVE NOTE   PROCEDURE: right brachiobasilic arteriovenous fistula cannulation under ultrasound guidance right arm fistulogram including central venogram right basilic vein angioplasty in the mid upper arm (5 mm x 80 mm Mustang, 6 mm x 100 mm Mustang, and 7 mm x 80 mm Mustang)  PRE-OPERATIVE DIAGNOSIS: Malfunctioning right arm arteriovenous fistula  POST-OPERATIVE DIAGNOSIS: same as above  SURGEON: Sherry Heck, MD  ANESTHESIA: local  ESTIMATED BLOOD LOSS: 5 cc  FINDING(S): Difficult to visualize central venous system given catheter and poor compliance of patient on the table.  The mid upper arm basilic vein had two stenotic areas one greater than 99% and the other well over 70%.  Both of these were crossed with antegrade access and angioplastied with a 5 mm, 6 mm and 7 mm angioplasty balloon.  Patient had a pulsatile fistula at the start of the case and now has an excellent thrill.  Excellent results and fistula is available for use immediately.  SPECIMEN(S):  None  CONTRAST:  65 mL  INDICATIONS: Sherry [name] is a 66 y.o. female who presents with malfunctioning right brachiobasilic arteriovenous fistula.  The patient is scheduled for right arm fistuloram.  The patient is aware the risks include but are not limited to: bleeding, infection, thrombosis of the cannulated access, and possible anaphylactic reaction to the contrast.  The patient is aware of the risks of the procedure and elects to proceed forward.  DESCRIPTION: After full informed written consent was obtained, the patient was brought back to the angiography suite and placed supine upon the angiography table.  The patient was connected to  monitoring equipment.  The right arm was prepped and draped in the standard fashion for a right arm fistulogram.  Under ultrasound guidance, the right arm arteriovenous fistula was evaluated with ultrasound, it was patent, an image was saved.  It was cannulated with a micropuncture needle.  The microwire was advanced into the fistula and the needle was exchanged for the a microsheath, which was lodged 2 cm into the access.  The wire was removed and the sheath was connected to the IV extension tubing.  Hand injections were completed to image the access from the antecubitum up to the level of axilla.  The central venous structures were also imaged by hand injections.  We elected to perform intervention.  A Bentson wire was used to cross the stenosis in the mid upper arm antegrade.  Patient was given 3000 units of IV heparin.  We exchanged for a short 6 [place] sheath.  We then angioplastied these lesions in the basilic vein with a 5 mm, 6 mm and 7 mm Mustang to nominal pressure for 2 minutes at each lesion.  At completion there was no significant residual stenosis with a excellent thrill.  A 4-0 Monocryl purse-string suture was sewn around the sheath.  The sheath was removed while tying down the suture.  A sterile bandage was applied to the puncture site.  COMPLICATIONS: None  CONDITION: Stable  Sherry Heck, MD [facility] Office: [phone]   US Abdomen Limited RUQ (LIVER/GB)  Result Date: 01/29/2022 CLINICAL DATA:  Abnormal CT. EXAM: ULTRASOUND ABDOMEN LIMITED RIGHT UPPER QUADRANT COMPARISON:  Abdominopelvic CT earlier today. FINDINGS: Gallbladder: Surgically absent. Common bile duct: Diameter: 12 mm at the porta hepatis. No visualized choledocholithiasis, although the distal common bile duct is not well evaluated on the current exam. Liver: The intrahepatic biliary ductal dilatation on CT is not well seen. Mild heterogeneous and increased parenchymal echogenicity.  Portal vein is patent on color Doppler imaging with normal direction of blood flow towards the liver. Other: Technically limited and challenging exam due to patient's medical condition, difficulty tolerating the exam. IMPRESSION: 1. Post cholecystectomy. Common bile duct dilatation, 12 mm at the porta hepatis. The intrahepatic biliary ductal dilatation on CT is not well-defined. There is no visualized choledocholithiasis, although the distal common bile duct is obscured on the current exam. Recommend correlation with LFTs. MRCP is not recommended at this time given patient is unable to tolerate ultrasound, node not tolerate holding still for MRI. 2. Suggestion of mild hepatic steatosis. Electronically Signed   By: Keith Rake M.D.   On: 01/29/2022 00:33   CT ABDOMEN PELVIS W CONTRAST  Result Date: 01/28/2022 CLINICAL DATA:  Nausea/vomiting. Abdominal pain, acute, nonlocalized EXAM: CT ABDOMEN AND PELVIS WITH CONTRAST TECHNIQUE: Multidetector CT imaging of the abdomen and pelvis was performed using the standard protocol following bolus administration of intravenous contrast. RADIATION DOSE REDUCTION: This exam  was performed according to the departmental dose-optimization program which includes automated exposure control, adjustment of the mA and/or kV according to patient size and/or use of iterative reconstruction technique. CONTRAST:  117m OMNIPAQUE IOHEXOL 300 MG/ML  SOLN COMPARISON:  12/22/2021 FINDINGS: Lower chest: Visualized lung bases are clear. Cardiac size within normal limits. Mild left ventricular hypertrophy noted. Central venous catheter tip noted within the superior right atrium. No pericardial effusion peer Hepatobiliary: Status post cholecystectomy. Mild intra and moderate extrahepatic biliary ductal dilation appears slightly progressive since prior examination with the extrahepatic bile duct measuring up to 16 mm in diameter. While this may represent post cholecystectomy change, distal  obstructing process is not excluded on this limited examination. 9 mm hypodensity within the right hepatic lobe, axial image # 14/3 is indeterminate but may represent a tiny cyst or hemangioma in a patient without a history of malignancy. The liver is otherwise unremarkable. Pancreas: Unremarkable Spleen: Unremarkable Adrenals/Urinary Tract: Stable 21 mm left adrenal nodule better characterized as a benign adrenal adenoma on prior noncontrast CT examination of 11/18/2019. The kidneys are markedly atrophic bilaterally with numerous simple cysts seen bilaterally in keeping with cystic change of chronic renal failure. No follow-up imaging is recommended for these lesions. No definite enhancing intrarenal mass is identified. No hydronephrosis. No intrarenal or ureteral calculi. The bladder is unremarkable. Stomach/Bowel: Mild sigmoid diverticulosis. Stomach, small bowel, and large bowel are otherwise unremarkable. Appendix normal. No free intraperitoneal gas or fluid. Vascular/Lymphatic: Moderate aortoiliac atherosclerotic calcification. No aortic aneurysm. No pathologic adenopathy within the abdomen and pelvis. Reproductive: Status post hysterectomy. No adnexal masses. Other: Umbilical hernia repair with mesh has been performed. There is diastasis of the rectus abdominus musculature with protuberance in the umbilical region without recurrent frank hernia. The rectum is unremarkable. Musculoskeletal: Advanced degenerative changes are noted within the right hip. L4-5 lumbar fusion with instrumentation has been performed no acute bone abnormality. No lytic or blastic bone lesion. IMPRESSION: 1. No acute intra-abdominal pathology identified. No definite radiographic explanation for the patient's reported symptoms. 2. Status post cholecystectomy. Progressive intra and extrahepatic biliary ductal dilation. While this may represent post cholecystectomy change, distal obstructing process is not excluded on this limited  examination. Correlation with liver enzymes may be helpful for further evaluation. 3. Stable left adrenal adenoma. 4. Mild sigmoid diverticulosis. 5. Status post umbilical hernia repair with mesh. Diastasis of the rectus abdominus musculature with protuberance in the umbilical region without recurrent frank hernia. Aortic Atherosclerosis (ICD10-I70.0). Electronically Signed   By: AFidela SalisburyM.D.   On: 01/28/2022 20:57   VAS [place] DIALYSIS ACCESS (AVF, AVG)  Result Date: 01/09/2022 DIALYSIS ACCESS Patient Name:  GSHONTELLE MUSKA Date of Exam:   01/09/2022 Medical Rec #: [id number]    Accession #:    [id number] of Birth: 5Sep 06, 1957    Patient Gender: F Patient Age:   63years [facility] Procedure:      VAS [place] DIALYSIS ACCESS (AVF, AVG) Referring Phys: JVonna KotykROBINS --------------------------------------------------------------------------------  Reason for Exam: Unable to dialyze through AVF/AVG. Access Site: Right Upper Extremity. Access Type: Basilic vein transposition, 2nd stage. Performing Technologist: SAlvia GroveRVT  Examination Guidelines: A complete evaluation includes B-mode imaging, spectral Doppler, color Doppler, and power Doppler as needed of all accessible portions of each vessel. Unilateral testing is considered an integral part of a complete examination. Limited examinations for reoccurring indications may be performed as noted.  Findings: +--------------------+----------+-----------------+--------+ AVF  PSV (cm/s)Flow Vol (mL/min)Comments +--------------------+----------+-----------------+--------+ Native artery inflow   189          1042                +--------------------+----------+-----------------+--------+ AVF Anastomosis        230                              +--------------------+----------+-----------------+--------+  +------------+----------+-------------+-----------+----------------------------+ OUTFLOW  VEINPSV (cm/s)Diameter (cm)Depth (cm)           Describe           +------------+----------+-------------+-----------+----------------------------+ Prox UA        418        0.53        1.44      proximal , axillary area   +------------+----------+-------------+-----------+----------------------------+ Mid UA      275 / 724  0.44 / 0.20 0.44 / 0.64   change in Diameter and                                                    approx 2.48 cm in length   +------------+----------+-------------+-----------+----------------------------+ Dist UA        279        0.43        0.77                                 +------------+----------+-------------+-----------+----------------------------+ AC Fossa       272        0.56        1.04                                 +------------+----------+-------------+-----------+----------------------------+   Summary: Patent arteriovenous fistula with increased velociy in the mid outflow vein.  *See table(s) above for measurements and observations.  Diagnosing physician: Orlie Pollen Electronically signed by Orlie Pollen on 01/09/2022 at 5:33:45 PM.    --------------------------------------------------------------------------------   Final     Microbiology: Results for orders placed or performed during the hospital encounter of 08/31/21  Resp Panel by RT-PCR (Flu A&B, Covid) Nasopharyngeal Swab     Status: None   Collection Time: 09/01/21 12:06 AM   Specimen: Nasopharyngeal Swab; Nasopharyngeal(NP) swabs in vial transport medium  Result Value Ref Range Status   SARS Coronavirus 2 by RT PCR NEGATIVE NEGATIVE Final    Comment: (NOTE) SARS-CoV-2 target nucleic acids are NOT DETECTED.  The SARS-CoV-2 RNA is generally detectable in upper respiratory specimens during the acute phase of infection. The lowest concentration of SARS-CoV-2 viral copies this assay can detect is 138 copies/mL. A negative result does not preclude SARS-Cov-2 infection  and should not be used as the sole basis for treatment or other patient management decisions. A negative result may occur with  improper specimen collection/handling, submission of specimen other than nasopharyngeal swab, presence of viral mutation(s) within the areas targeted by this assay, and inadequate number of viral copies(<138 copies/mL). A negative result must be combined with clinical observations, patient history, and epidemiological information. The expected result is Negative.  Fact Sheet for Patients:  EntrepreneurPulse.com.au  Fact Sheet for Healthcare Providers:  IncredibleEmployment.be  This test is no t yet  approved or cleared by the [place] and  has been authorized for detection and/or diagnosis of SARS-CoV-2 by FDA under an Emergency Use Authorization (EUA). This EUA will remain  in effect (meaning this test can be used) for the duration of the COVID-19 declaration under Section 564(b)(1) of the Act, 21 U.S.C.section 360bbb-3(b)(1), unless the authorization is terminated  or revoked sooner.       Influenza A by PCR NEGATIVE NEGATIVE Final   Influenza B by PCR NEGATIVE NEGATIVE Final    Comment: (NOTE) The Xpert Xpress SARS-CoV-2/FLU/RSV plus assay is intended as an aid in the diagnosis of influenza from Nasopharyngeal swab specimens and should not be used as a sole basis for treatment. Nasal washings and aspirates are unacceptable for Xpert Xpress SARS-CoV-2/FLU/RSV testing.  Fact Sheet for Patients: EntrepreneurPulse.com.au  Fact Sheet for Healthcare Providers: IncredibleEmployment.be  This test is not yet approved or cleared by the [place] FDA and has been authorized for detection and/or diagnosis of SARS-CoV-2 by FDA under an Emergency Use Authorization (EUA). This EUA will remain in effect (meaning this test can be used) for the duration of the COVID-19 declaration  under Section 564(b)(1) of the Act, 21 U.S.C. section 360bbb-3(b)(1), unless the authorization is terminated or revoked.  Performed at [facility], [place] [address] [place], [place] [address]     Labs: CBC: Recent Labs  Lab 01/28/22 1702 01/30/22 1039  WBC 7.5 6.7  NEUTROABS 4.5  --   HGB 12.5 11.1*  HCT 39.5 35.7*  MCV 84.8 87.9  PLT 272 235   Basic Metabolic Panel: Recent Labs  Lab 01/28/22 1702 01/30/22 1038  NA 139 136  K 3.5 4.0  CL 98 94*  CO2 25 28  GLUCOSE 92 94  BUN 7* 20  CREATININE 3.33* 7.65*  CALCIUM 9.4 9.0  PHOS  --  8.7*   Liver Function Tests: Recent Labs  Lab 01/28/22 1702 01/30/22 1038  AST 24  --   ALT 13  --   ALKPHOS 97  --   BILITOT 0.5  --   PROT 7.8  --   ALBUMIN 4.0 3.2*   CBG: No results for input(s): "GLUCAP" in the last 168 hours.  Discharge time spent: greater than 30 minutes.  Signed: Oswald Hillock, MD [facility] 02/01/2022

## 2022-02-01 NOTE — Social Work (Signed)
CSW was alerted that pt needed a ride home. CSW asked RN if pt had anyone that could transport pt home. Pt states that she has no one to transport. CSW spoke with pt and verified if pt could pay for ride home herself and she states no. CSW verified cab voucher amount and receive approval form TOC leadership. Cab voucher supplies.

## 2022-02-01 NOTE — Progress Notes (Signed)
Patient given discharge instructions and ststed understanding.  We are using a cab voucher for her ride home.

## 2022-02-03 ENCOUNTER — Telehealth: Payer: Self-pay | Admitting: Nephrology

## 2022-02-03 NOTE — Telephone Encounter (Signed)
Transition of Care Contact from [place]   Date of Discharge: 02/01/22 Date of Contact: 02/03/22 Method of contact: phone Talked to patient   Patient contacted to discuss transition of care form recent hospitaliztion. Patient was admitted to [facility] from 01/28/22 to 02/01/22 with the discharge diagnosis of abdominal pain.    Patient reports I "caught her at a bad time.  I am trying to get ready for appointment with [facility]."  Advised we would follow up with her at dialysis this week.    Patient will follow up with is outpatient dialysis center 02/04/22.   Jen Mow, PA-C [employer]

## 2022-02-23 ENCOUNTER — Emergency Department (HOSPITAL_COMMUNITY)
Admission: EM | Admit: 2022-02-23 | Discharge: 2022-02-23 | Disposition: A | Discharge status: Home / Self Care | Payer: Medicare Other | Attending: Emergency Medicine | Admitting: Emergency Medicine

## 2022-02-23 ENCOUNTER — Other Ambulatory Visit: Payer: Self-pay

## 2022-02-23 ENCOUNTER — Emergency Department (HOSPITAL_COMMUNITY): Payer: Medicare Other

## 2022-02-23 ENCOUNTER — Encounter (HOSPITAL_COMMUNITY): Payer: Self-pay | Admitting: Emergency Medicine

## 2022-02-23 DIAGNOSIS — R110 Nausea: Secondary | ICD-10-CM | POA: Diagnosis not present

## 2022-02-23 DIAGNOSIS — N189 Chronic kidney disease, unspecified: Secondary | ICD-10-CM | POA: Insufficient documentation

## 2022-02-23 DIAGNOSIS — R1084 Generalized abdominal pain: Secondary | ICD-10-CM | POA: Insufficient documentation

## 2022-02-23 DIAGNOSIS — G249 Dystonia, unspecified: Secondary | ICD-10-CM | POA: Diagnosis not present

## 2022-02-23 DIAGNOSIS — D649 Anemia, unspecified: Secondary | ICD-10-CM | POA: Diagnosis not present

## 2022-02-23 LAB — COMPREHENSIVE METABOLIC PANEL
ALT: 9 U/L (ref 0–44)
AST: 16 U/L (ref 15–41)
Albumin: 3.7 g/dL (ref 3.5–5.0)
Alkaline Phosphatase: 80 U/L (ref 38–126)
Anion gap: 16 — ABNORMAL HIGH (ref 5–15)
BUN: 13 mg/dL (ref 8–23)
CO2: 24 mmol/L (ref 22–32)
Calcium: 8.6 mg/dL — ABNORMAL LOW (ref 8.9–10.3)
Chloride: 96 mmol/L — ABNORMAL LOW (ref 98–111)
Creatinine, Ser: 3.71 mg/dL — ABNORMAL HIGH (ref 0.44–1.00)
GFR, Estimated: 13 mL/min — ABNORMAL LOW (ref >60)
Glucose, Bld: 77 mg/dL (ref 70–99)
Potassium: 3.8 mmol/L (ref 3.5–5.1)
Sodium: 136 mmol/L (ref 135–145)
Total Bilirubin: 0.6 mg/dL (ref 0.3–1.2)
Total Protein: 7.6 g/dL (ref 6.5–8.1)

## 2022-02-23 LAB — CBC WITH DIFFERENTIAL/PLATELET
Abs Immature Granulocytes: 0.02 10*3/uL (ref 0.00–0.07)
Basophils Absolute: 0 10*3/uL (ref 0.0–0.1)
Basophils Relative: 1 %
Eosinophils Absolute: 0.1 10*3/uL (ref 0.0–0.5)
Eosinophils Relative: 2 %
HCT: 33.7 % — ABNORMAL LOW (ref 36.0–46.0)
Hemoglobin: 10.7 g/dL — ABNORMAL LOW (ref 12.0–15.0)
Immature Granulocytes: 0 %
Lymphocytes Relative: 29 %
Lymphs Abs: 1.6 10*3/uL (ref 0.7–4.0)
MCH: 28.5 pg (ref 26.0–34.0)
MCHC: 31.8 g/dL (ref 30.0–36.0)
MCV: 89.6 fL (ref 80.0–100.0)
Monocytes Absolute: 0.5 10*3/uL (ref 0.1–1.0)
Monocytes Relative: 10 %
Neutro Abs: 3.3 10*3/uL (ref 1.7–7.7)
Neutrophils Relative %: 58 %
Platelets: 226 10*3/uL (ref 150–400)
RBC: 3.76 MIL/uL — ABNORMAL LOW (ref 3.87–5.11)
RDW: 19.4 % — ABNORMAL HIGH (ref 11.5–15.5)
WBC: 5.6 10*3/uL (ref 4.0–10.5)
nRBC: 0 % (ref 0.0–0.2)

## 2022-02-23 LAB — LIPASE, BLOOD: Lipase: 32 U/L (ref 11–51)

## 2022-02-23 LAB — BRAIN NATRIURETIC PEPTIDE: B Natriuretic Peptide: 189.3 pg/mL — ABNORMAL HIGH (ref 0.0–100.0)

## 2022-02-23 LAB — TROPONIN I (HIGH SENSITIVITY): Troponin I (High Sensitivity): 6 ng/L (ref <18)

## 2022-02-23 MED ORDER — HYDROMORPHONE HCL 1 MG/ML IJ SOLN
1.0000 mg | Freq: Once | INTRAMUSCULAR | Status: AC
  Administered 2022-02-23: 1 mg via INTRAVENOUS
  Filled 2022-02-23: qty 1

## 2022-02-23 MED ORDER — ONDANSETRON 4 MG PO TBDP
4.0000 mg | ORAL_TABLET | Freq: Once | ORAL | Status: AC
  Administered 2022-02-23: 4 mg via ORAL
  Filled 2022-02-23: qty 1

## 2022-02-23 NOTE — ED Notes (Addendum)
Paramedic and RN attempted X3 to get IV access with no success. IV team is consulted.  Provider advises to give the pain meds IM for now and see if that helps. Pain meds administered IM

## 2022-02-23 NOTE — ED Notes (Signed)
Patient refusing to allow staff to hook her up to monitor bp and pulse ox

## 2022-02-23 NOTE — ED Triage Notes (Signed)
BIBA Per EMS: pt coming from dialysis w/ c/o nausea. Was given 4 mg zofran and then smoked a cigarette and is now nauseas.  115/80 84 CBG  90 3L O2

## 2022-02-23 NOTE — ED Provider Notes (Signed)
[facility] DEPT Provider Note   CSN: [id number] Arrival date & time: 02/23/22  1700     History {Add pertinent medical, surgical, social history, OB history to HPI:1} No chief complaint on file.   Sherry [name] is a 66 y.o. female.  He is brought in by EMS from dialysis.  Reportedly had some nausea after dialysis.  Since she has been here she is complaining now of abdominal pain and severe.  She said she has had this before and pain medicines usually help.  On review of prior medical records she was recently admitted to the hospital for midepigastric abdominal pain with no clear etiology identified.  Patient rates her pain as 10 out of 10.  She denies any diarrhea.  She does states she still makes some urine.  No fevers or chills.  Patient is difficult to understand because she keeps grunting about her pain and will not answer questions.  The history is provided by the patient.  Abdominal Pain Pain location:  Generalized Pain severity:  Severe Onset quality:  Gradual Duration:  3 hours Timing:  Constant Progression:  Unchanged Chronicity:  Recurrent Context: not trauma   Relieved by:  None tried Worsened by:  Nothing Ineffective treatments:  None tried Associated symptoms: nausea   Associated symptoms: no chest pain, no cough, no diarrhea and no fever   Risk factors: recent hospitalization        Home Medications Prior to Admission medications   Medication Sig Start Date End Date Taking? Authorizing Provider  ACETAMINOPHEN EXTRA STRENGTH 500 MG tablet Take 500 mg by mouth every 6 (six) hours as needed for pain. 12/30/21   [provider]  albuterol (VENTOLIN HFA) 108 (90 Base) MCG/ACT inhaler Inhale 2 puffs into the lungs every 4 (four) hours as needed for wheezing or shortness of breath. 04/23/21   [provider]  BELBUCA 600 MCG FILM Take 1 Film by mouth 2 (two) times daily. 01/12/22   [provider]  FLUoxetine  (PROZAC) 40 MG capsule Take 40 mg by mouth in the morning. 11/09/18   [provider]  gabapentin (NEURONTIN) 400 MG capsule Take 400 mg by mouth 2 (two) times daily. 12/28/21   [provider]  hydrALAZINE (APRESOLINE) 50 MG tablet Take 50 mg by mouth 3 (three) times daily. 03/24/21   [provider]  INGREZZA 80 MG CAPS Take 80 mg by mouth at bedtime. 10/15/17   [provider]  isosorbide dinitrate (ISORDIL) 20 MG tablet Take 20 mg by mouth 3 (three) times daily. 12/30/21   [provider]  labetalol (NORMODYNE) 200 MG tablet Take 200 mg by mouth 2 (two) times daily. 12/26/21   [provider]  lidocaine (LIDODERM) 5 % Place 1 patch onto the skin daily. Remove & Discard patch within 12 hours or as directed by MD Patient taking differently: Place 1 patch onto the skin daily as needed (back pain). Remove & Discard patch within 12 hours or as directed by MD 12/27/21   Evlyn Courier, PA-C  lidocaine-prilocaine (EMLA) cream SMARTSIG:sparingly Topical As Directed 10/28/21   [provider]  Melatonin 10 MG TABS Take 10 mg by mouth at bedtime as needed (sleep).    [provider]  nitroGLYCERIN (NITROSTAT) 0.4 MG SL tablet Place 0.4 mg under the tongue every 5 (five) minutes as needed for chest pain. 12/30/21   [provider]  pantoprazole (PROTONIX) 40 MG tablet Take 1 tablet (40 mg total) by mouth daily.  02/01/22 04/02/22  Oswald Hillock, MD  traMADol (ULTRAM) 50 MG tablet Take 50-100 mg by mouth 3 (three) times daily as needed for severe pain.    [provider]  zolpidem (AMBIEN) 5 MG tablet Take 5 mg by mouth at bedtime. 01/05/22   [provider]      Allergies    Abilify [aripiprazole], Remeron [mirtazapine], Trazodone and nefazodone, Flexeril [cyclobenzaprine], and Amoxicillin    Review of Systems   Review of Systems  Constitutional:  Negative for fever.  Respiratory:  Negative for cough.   Cardiovascular:   Negative for chest pain.  Gastrointestinal:  Positive for abdominal pain and nausea. Negative for diarrhea.    Physical Exam Updated Vital Signs BP (!) 154/93   Pulse 88   Temp 99.1 F (37.3 C)   Resp (!) 23   Ht '5\' 2"'$  (1.575 m)   Wt 78 kg   SpO2 100%   BMI 31.46 kg/m  Physical Exam Vitals and nursing note reviewed.  Constitutional:      General: She is not in acute distress.    Appearance: Normal appearance. She is well-developed. She is obese.  HENT:     Head: Normocephalic and atraumatic.  Eyes:     Conjunctiva/sclera: Conjunctivae normal.  Cardiovascular:     Rate and Rhythm: Normal rate and regular rhythm.     Heart sounds: No murmur heard. Pulmonary:     Effort: Pulmonary effort is normal. No respiratory distress.     Breath sounds: Normal breath sounds.     Comments: Vascular catheter right upper chest nontender. Abdominal:     Palpations: Abdomen is soft.     Tenderness: There is no abdominal tenderness. There is no guarding or rebound.  Musculoskeletal:        General: No swelling.     Cervical back: Neck supple.  Skin:    General: Skin is warm and dry.     Capillary Refill: Capillary refill takes less than 2 seconds.  Neurological:     Mental Status: She is alert. Mental status is at baseline.     ED Results / Procedures / Treatments   Labs (all labs ordered are listed, but only abnormal results are displayed) Labs Reviewed  COMPREHENSIVE METABOLIC PANEL - Abnormal; Notable for the following components:      Result Value   Chloride 96 (*)    Creatinine, Ser 3.71 (*)    Calcium 8.6 (*)    GFR, Estimated 13 (*)    Anion gap 16 (*)    All other components within normal limits  CBC WITH DIFFERENTIAL/PLATELET - Abnormal; Notable for the following components:   RBC 3.76 (*)    Hemoglobin 10.7 (*)    HCT 33.7 (*)    RDW 19.4 (*)    All other components within normal limits  LIPASE, BLOOD  URINALYSIS, ROUTINE W REFLEX MICROSCOPIC  BRAIN NATRIURETIC  PEPTIDE  TROPONIN I (HIGH SENSITIVITY)    EKG None  Radiology DG Chest Portable 1 View  Result Date: 02/23/2022 CLINICAL DATA:  Shortness of breath EXAM: PORTABLE CHEST 1 VIEW COMPARISON:  02/21/2022 FINDINGS: Cardiac shadow is mildly prominent. Aortic calcifications are seen. Right jugular dialysis catheter is noted. The lungs are well aerated without focal infiltrate. Chronic scarring in the left mid lung is noted. No bony abnormality is seen. Postsurgical changes in the cervical spine are noted. IMPRESSION: No acute abnormality noted. Electronically Signed   By: Inez Catalina M.D.   On: 02/23/2022 19:23  Procedures Procedures  {Document cardiac monitor, telemetry assessment procedure when appropriate:1}  Medications Ordered in ED Medications  HYDROmorphone (DILAUDID) injection 1 mg (has no administration in time range)  ondansetron (ZOFRAN-ODT) disintegrating tablet 4 mg (4 mg Oral Given 02/23/22 1734)    ED Course/ Medical Decision Making/ A&P                           Medical Decision Making Amount and/or Complexity of Data Reviewed Labs: ordered. Radiology: ordered.  Risk Prescription drug management.   This patient complains of ***; this involves an extensive number of treatment Options and is a complaint that carries with it a high risk of complications and morbidity. The differential includes ***  I ordered, reviewed and interpreted labs, which included *** I ordered medication *** and reviewed PMP when indicated. I ordered imaging studies which included *** and I independently    visualized and interpreted imaging which showed *** Additional history obtained from *** Previous records obtained and reviewed *** I consulted *** and discussed lab and imaging findings and discussed disposition.  Cardiac monitoring reviewed, *** Social determinants considered, *** Critical Interventions: ***  After the interventions stated above, I reevaluated the patient and  found *** Admission and further testing considered, ***   {Document critical care time when appropriate:1} {Document review of labs and clinical decision tools ie heart score, Chads2Vasc2 etc:1}  {Document your independent review of radiology images, and any outside records:1} {Document your discussion with family members, caretakers, and with consultants:1} {Document social determinants of health affecting pt's care:1} {Document your decision making why or why not admission, treatments were needed:1} Final Clinical Impression(s) / ED Diagnoses Final diagnoses:  None    Rx / DC Orders ED Discharge Orders     None

## 2022-02-23 NOTE — ED Provider Triage Note (Signed)
Emergency Medicine Provider Triage Evaluation Note  Sherry [name] , a 66 y.o. female  was evaluated in triage.  Pt complains of severe nausea after leaving dialysis earlier today.  Patient states that she felt "bad" while she was at dialysis.  They resume Zofran and then she went outside and smoked a cigarette and then nausea returned.  She also is complaining of epigastric pain.  Denies fever, chills, diarrhea, chest pain and shortness of breath.  Review of Systems  Positive:  Negative:   Physical Exam  BP 137/87   Pulse 100   Temp 99.1 F (37.3 C)   Resp 16   Ht '5\' 2"'$  (1.575 m)   Wt 78 kg   SpO2 100%   BMI 31.46 kg/m  Gen:   Awake, moaning and rocking in pain Resp:  Normal effort  MSK:   Moves extremities without difficulty  Other:    Medical Decision Making  Medically screening exam initiated at 5:26 PM.  Appropriate orders placed.  Sherry [name] was informed that the remainder of the evaluation will be completed by another provider, this initial triage assessment does not replace that evaluation, and the importance of remaining in the ED until their evaluation is complete.     Tonye Pearson, [place] 02/23/22 1727

## 2022-02-23 NOTE — ED Notes (Signed)
Called lab to add troponin and bnp to blood in the lab

## 2022-02-23 NOTE — ED Notes (Signed)
Since 1900 Pt has gotten the attention of 3 employees and myself to notify us she was in pain. Pt reports 10 out of 10 pain. She advised it just started and she wasn't hurting when she came in. I advised the pt I would notify a provider. Pt also wants to know how long she is going to be here. I advised the pt we did not know and that it would be up to the provider and the things they need to do to make her better.

## 2022-02-23 NOTE — Discharge Instructions (Signed)
You were seen in the emergency department for recurrent abdominal pain.  Your lab work and CAT scan did not show any obvious explanation for your symptoms.  Please continue your regular medications and follow-up with your primary care doctor.

## 2022-02-24 ENCOUNTER — Encounter (HOSPITAL_COMMUNITY): Payer: Self-pay

## 2022-02-24 ENCOUNTER — Encounter (HOSPITAL_COMMUNITY): Payer: Self-pay | Admitting: Emergency Medicine

## 2022-02-24 ENCOUNTER — Emergency Department (HOSPITAL_COMMUNITY)
Admission: EM | Admit: 2022-02-24 | Discharge: 2022-02-24 | Disposition: A | Discharge status: Home / Self Care | Payer: Medicare Other | Source: Home / Self Care | Attending: Emergency Medicine | Admitting: Emergency Medicine

## 2022-02-24 ENCOUNTER — Other Ambulatory Visit: Payer: Self-pay

## 2022-02-24 ENCOUNTER — Emergency Department (HOSPITAL_COMMUNITY)
Admission: EM | Admit: 2022-02-24 | Discharge: 2022-02-24 | Disposition: A | Discharge status: Home / Self Care | Payer: Medicare Other | Attending: Emergency Medicine | Admitting: Emergency Medicine

## 2022-02-24 DIAGNOSIS — G894 Chronic pain syndrome: Secondary | ICD-10-CM | POA: Insufficient documentation

## 2022-02-24 DIAGNOSIS — I132 Hypertensive heart and chronic kidney disease with heart failure and with stage 5 chronic kidney disease, or end stage renal disease: Secondary | ICD-10-CM | POA: Insufficient documentation

## 2022-02-24 DIAGNOSIS — Z5900 Homelessness unspecified: Secondary | ICD-10-CM | POA: Insufficient documentation

## 2022-02-24 DIAGNOSIS — N186 End stage renal disease: Secondary | ICD-10-CM | POA: Insufficient documentation

## 2022-02-24 DIAGNOSIS — I509 Heart failure, unspecified: Secondary | ICD-10-CM | POA: Insufficient documentation

## 2022-02-24 DIAGNOSIS — G8929 Other chronic pain: Secondary | ICD-10-CM | POA: Insufficient documentation

## 2022-02-24 MED ORDER — ACETAMINOPHEN 500 MG PO TABS
1000.0000 mg | ORAL_TABLET | Freq: Once | ORAL | Status: AC
Start: 2022-02-24 — End: 2022-02-24
  Administered 2022-02-24: 1000 mg via ORAL
  Filled 2022-02-24: qty 2

## 2022-02-24 MED ORDER — OXYCODONE-ACETAMINOPHEN 5-325 MG PO TABS
1.0000 | ORAL_TABLET | Freq: Once | ORAL | Status: AC
  Administered 2022-02-24: 1 via ORAL
  Filled 2022-02-24: qty 1

## 2022-02-24 NOTE — ED Provider Notes (Signed)
[facility] EMERGENCY DEPARTMENT Provider Note   CSN: [id number] Arrival date & time: 02/24/22  1021     History  Chief Complaint  Patient presents with   Abdominal Pain    Sherry [name] is a 66 y.o. female with a past medical history of hypertension, congestive heart failure, ESRD on dialysis presenting today due to abdominal pain.  Reports that its been going on for "a while."  Reports nothing is changed since her visit for abdominal pain yesterday.   Abdominal Pain      Home Medications Prior to Admission medications   Medication Sig Start Date End Date Taking? Authorizing Provider  ACETAMINOPHEN EXTRA STRENGTH 500 MG tablet Take 500 mg by mouth every 6 (six) hours as needed for pain. 12/30/21   [provider]  albuterol (VENTOLIN HFA) 108 (90 Base) MCG/ACT inhaler Inhale 2 puffs into the lungs every 4 (four) hours as needed for wheezing or shortness of breath. 04/23/21   [provider]  BELBUCA 600 MCG FILM Take 1 Film by mouth 2 (two) times daily. 01/12/22   [provider]  BELBUCA 750 MCG FILM SMARTSIG:1 Strip(s) By Mouth Twice Daily PRN 02/06/22   [provider]  FLUoxetine (PROZAC) 40 MG capsule Take 40 mg by mouth in the morning. 11/09/18   [provider]  gabapentin (NEURONTIN) 400 MG capsule Take 400 mg by mouth 2 (two) times daily. 12/28/21   [provider]  hydrALAZINE (APRESOLINE) 50 MG tablet Take 50 mg by mouth 3 (three) times daily. 03/24/21   [provider]  INGREZZA 80 MG CAPS Take 80 mg by mouth at bedtime. 10/15/17   [provider]  isosorbide dinitrate (ISORDIL) 20 MG tablet Take 20 mg by mouth 3 (three) times daily. 12/30/21   [provider]  labetalol (NORMODYNE) 200 MG tablet Take 200 mg by mouth 2 (two) times daily. 12/26/21   [provider]  lidocaine (LIDODERM) 5 % Place 1 patch onto the skin daily. Remove & Discard patch within 12 hours or as  directed by MD Patient taking differently: Place 1 patch onto the skin daily as needed (back pain). Remove & Discard patch within 12 hours or as directed by MD 12/27/21   Evlyn Courier, PA-C  lidocaine-prilocaine (EMLA) cream SMARTSIG:sparingly Topical As Directed 10/28/21   [provider]  Melatonin 10 MG TABS Take 10 mg by mouth at bedtime as needed (sleep).    [provider]  nitroGLYCERIN (NITROSTAT) 0.4 MG SL tablet Place 0.4 mg under the tongue every 5 (five) minutes as needed for chest pain. 12/30/21   [provider]  pantoprazole (PROTONIX) 40 MG tablet Take 1 tablet (40 mg total) by mouth daily. 02/01/22 04/02/22  Oswald Hillock, MD  traMADol (ULTRAM) 50 MG tablet Take 50-100 mg by mouth 3 (three) times daily as needed for severe pain.    [provider]  zolpidem (AMBIEN) 5 MG tablet Take 5 mg by mouth at bedtime. 01/05/22   [provider]      Allergies    Abilify [aripiprazole], Remeron [mirtazapine], Trazodone and nefazodone, Flexeril [cyclobenzaprine], and Amoxicillin    Review of Systems   Review of Systems  Gastrointestinal:  Positive for abdominal pain.    Physical Exam Updated Vital Signs BP (!) 180/162   Pulse 99   Temp 98.1 F (36.7 C) (Oral)   Resp (!) 24   SpO2 100%  Physical Exam Vitals and nursing note reviewed.  Constitutional:  Appearance: Normal appearance.     Comments: Rolling and screaming in pain  HENT:     Head: Normocephalic and atraumatic.  Eyes:     General: No scleral icterus.    Conjunctiva/sclera: Conjunctivae normal.  Pulmonary:     Effort: Pulmonary effort is normal. No respiratory distress.  Abdominal:     General: Abdomen is flat.     Palpations: Abdomen is soft.     Tenderness: There is generalized abdominal tenderness.  Skin:    Findings: No rash.  Neurological:     Mental Status: She is alert.  Psychiatric:        Mood and Affect: Mood normal.     ED Results / Procedures /  Treatments   Labs (all labs ordered are listed, but only abnormal results are displayed) Labs Reviewed - No data to display  EKG None  Radiology CT Abdomen Pelvis Wo Contrast  Result Date: 02/23/2022 CLINICAL DATA:  Abdominal pain, acute, nonlocalized. Per EMS: pt coming from dialysis w/ c/o nausea. Was given 4 mg zofran and then smoked a cigarette and is now nauseas Patient altered and unable to hold still or follow breathing command EXAM: CT ABDOMEN AND PELVIS WITHOUT CONTRAST TECHNIQUE: Multidetector CT imaging of the abdomen and pelvis was performed following the standard protocol without IV contrast. RADIATION DOSE REDUCTION: This exam was performed according to the departmental dose-optimization program which includes automated exposure control, adjustment of the mA and/or kV according to patient size and/or use of iterative reconstruction technique. COMPARISON:  CT abdomen pelvis 02/21/2022 FINDINGS: Limited evaluation due to motion artifact. Lower chest: Coronary calcification.  No acute abnormality. Hepatobiliary: No focal liver abnormality. Status post cholecystectomy. No biliary dilatation. Pancreas: No focal lesion. Normal pancreatic contour. No surrounding inflammatory changes. No main pancreatic ductal dilatation. Spleen: Normal in size without focal abnormality. Adrenals/Urinary Tract: Markedly limited evaluation of known left adrenal gland adenoma that demonstrates a density of 12 Hounsfield units. No right adrenal nodularity. No nephrolithiasis and no hydronephrosis. Fluid dense lesion within left kidney likely represents a simple renal cyst. Simple renal cysts, in the absence of clinically indicated signs/symptoms, require no independent follow-up. No ureterolithiasis or hydroureter. The urinary bladder is unremarkable. Stomach/Bowel: Stomach is within normal limits. No evidence of bowel wall thickening or dilatation. Scattered colonic diverticulosis. Stool throughout the ascending and  transverse colon. The appendix is not definitely identified with no inflammatory changes in the right lower quadrant to suggest acute appendicitis. Vascular/Lymphatic: No abdominal aorta or iliac aneurysm. Severe atherosclerotic plaque of the aorta and its branches. No abdominal, pelvic, or inguinal lymphadenopathy. Reproductive: Status post hysterectomy. No adnexal masses. Other: No intraperitoneal free fluid. No intraperitoneal free gas. No organized fluid collection. Musculoskeletal: Abdominal hernia repair with mesh. Bulging of the mesh noted into the umbilicus with no definite recurrent hernia. No suspicious lytic or blastic osseous lesions. No acute displaced fracture. L4-L5 posterolateral interbody fusion with associated laminectomy. Severe right hip degenerative changes. At least moderate left hip degenerative changes. IMPRESSION: 1. Colonic diverticulosis with no acute diverticulitis. 2. Severe degenerative changes of the right hip. 3.  Aortic Atherosclerosis (ICD10-I70.0). 4. Limited evaluation on this noncontrast study with motion artifact. The appendix is not definitely identified with no inflammatory changes in the right lower quadrant to suggest acute appendicitis. Electronically Signed   By: Iven Finn M.D.   On: 02/23/2022 22:55   DG Chest Portable 1 View  Result Date: 02/23/2022 CLINICAL DATA:  Shortness of breath EXAM: PORTABLE CHEST 1 VIEW COMPARISON:  02/21/2022 FINDINGS: Cardiac shadow is mildly prominent. Aortic calcifications are seen. Right jugular dialysis catheter is noted. The lungs are well aerated without focal infiltrate. Chronic scarring in the left mid lung is noted. No bony abnormality is seen. Postsurgical changes in the cervical spine are noted. IMPRESSION: No acute abnormality noted. Electronically Signed   By: Inez Catalina M.D.   On: 02/23/2022 19:23    Procedures Procedures   Medications Ordered in ED Medications  acetaminophen (TYLENOL) tablet 1,000 mg (1,000 mg  Oral Given 02/24/22 1105)    ED Course/ Medical Decision Making/ A&P                           Medical Decision Making Risk OTC drugs.   This patient presents to the ED for concern of abdominal pain. The differential diagnosis for generalized abdominal pain includes, but is not limited to AAA, gastroenteritis, appendicitis, Bowel obstruction, Bowel perforation. Gastroparesis, DKA, Hernia, Inflammatory bowel disease, mesenteric ischemia, pancreatitis, peritonitis SBP, volvulus.    This is not an exhaustive differential.    Past Medical History / Co-morbidities / Social History: Cholecystectomy, ESRD on dialysis.  Reports she has not missed any sessions   Additional history: Reviewed patient's ED visit yesterday and earlier this morning.  Also reviewed her family medicine note from 3 weeks ago.  It appears she has a history of bipolar disorder as well as opioid and substance use.   Physical Exam: Pertinent physical exam findings include generalized abdominal tenderness  Lab Tests: Lab work considered however done yesterday.  Do not suspect a change.   Imaging Studies: I viewed patient's CT from yesterday.  It was unremarkable.     Medications: Patient was offered Tylenol.  She originally refused this and said that she needed something stronger.  I told her she would not be getting anything stronger so she excepted the Tylenol    Disposition: 66 year old female presenting with abdominal pain.  She was seen yesterday for this as well as a couple weeks prior.  Discharged from [facility] this morning after similar complaint.  She was given Percocet at that time.  Currently she says her symptoms have not changed.  Will not repeat work-up.  I considered admission however ultimately patient has been instructed to follow-up outpatient twice in the past 24 hours.  I agree that she needs to follow-up outpatient with her family med and pain doctors.  She reports she has not gotten in contact  with them.  She will be discharged from triage.  She was noted to be hypertensive however without chest pain, headache, blurred vision or back pain.  Doubt hypertensive emergency at this time.  Likely secondary to pain/rolling around in the wheelchair.  Return precautions were discussed.    Final Clinical Impression(s) / ED Diagnoses Final diagnoses:  Chronic abdominal pain  Homeless    Rx / DC Orders ED Discharge Orders     None      Results and diagnoses were explained to the patient. Return precautions discussed in full. Patient had no additional questions and expressed complete understanding.   This chart was dictated using voice recognition software.  Despite best efforts to proofread,  errors can occur which can change the documentation meaning.    Darliss Ridgel 02/24/22 1145    Isla Pence, MD 02/24/22 1220

## 2022-02-24 NOTE — ED Triage Notes (Addendum)
Pt has been sitting in lobby all night and when asked to leave she stated that she wanted to check in. Pt is without ride and was given bus pass, but pt has not left lobby since discharge last night. Pt continues to holler "help me sir" and will not respond to questions.

## 2022-02-24 NOTE — ED Triage Notes (Signed)
Pt seen at [facility] earlier today for same.  Had workup and was discharged.  Pt asking for water.  PA in to eval patient.

## 2022-02-24 NOTE — ED Notes (Signed)
[facility], [phone]

## 2022-02-24 NOTE — ED Provider Notes (Signed)
[facility] DEPT Provider Note   CSN: [id number] Arrival date & time: 02/24/22  0731     History  Chief Complaint  Patient presents with   Homeless    Sherry [name] is a 66 y.o. female presenting back to the emergency department after discharge last night with continued complaint of pain.  The patient is a poor historian.  She reportedly spent the whole night in the lobby, refusing to leave.  I reviewed her medical records, she was evaluated yesterday evening complaining of abdominal pain.  She had a CT scan at that time which showed chronic osteoarthritis of the hip, no other emergent findings on her work-up.  She was given intramuscular pain medication which seemed to help at the time, as well as a bus token which she had requested.  However then she then refused to leave.  She reportedly sat in the lobby repeating "help me" all night.  She was advised she either needs to check back in or leave the department.  She checked back in.  She tells me she only needs pain medicine.  Patient was hospitalized and discharged for chronic pain in July, less than a month ago.  That time she also had a CT scan that showed chronic biliary ductal dilation in the setting of a cholecystectomy which is felt to be physiological and normal per GI consult.  LFTs were also normal.  The patient is end-stage renal disease on dialysis Monday Wednesday Friday.  Labs last night showed normal potassium of 3.8 and creatinine at baseline of 3.7. Troponins were unremarkable in the evening yesterday.  Liver enzymes and lipase also within normal limits.  She also has a history of opioid use dependence, alcohol abuse, bipolar disorder and tardive dyskinesia.  HPI     Home Medications Prior to Admission medications   Medication Sig Start Date End Date Taking? Authorizing Provider  ACETAMINOPHEN EXTRA STRENGTH 500 MG tablet Take 500 mg by mouth every 6 (six) hours as needed for pain. 12/30/21    [provider]  albuterol (VENTOLIN HFA) 108 (90 Base) MCG/ACT inhaler Inhale 2 puffs into the lungs every 4 (four) hours as needed for wheezing or shortness of breath. 04/23/21   [provider]  BELBUCA 600 MCG FILM Take 1 Film by mouth 2 (two) times daily. 01/12/22   [provider]  BELBUCA 750 MCG FILM SMARTSIG:1 Strip(s) By Mouth Twice Daily PRN 02/06/22   [provider]  FLUoxetine (PROZAC) 40 MG capsule Take 40 mg by mouth in the morning. 11/09/18   [provider]  gabapentin (NEURONTIN) 400 MG capsule Take 400 mg by mouth 2 (two) times daily. 12/28/21   [provider]  hydrALAZINE (APRESOLINE) 50 MG tablet Take 50 mg by mouth 3 (three) times daily. 03/24/21   [provider]  INGREZZA 80 MG CAPS Take 80 mg by mouth at bedtime. 10/15/17   [provider]  isosorbide dinitrate (ISORDIL) 20 MG tablet Take 20 mg by mouth 3 (three) times daily. 12/30/21   [provider]  labetalol (NORMODYNE) 200 MG tablet Take 200 mg by mouth 2 (two) times daily. 12/26/21   [provider]  lidocaine (LIDODERM) 5 % Place 1 patch onto the skin daily. Remove & Discard patch within 12 hours or as directed by MD Patient taking differently: Place 1 patch onto the skin daily as needed (back pain). Remove & Discard patch within 12 hours or as directed by MD 12/27/21   Deatra Canter,  Amjad, PA-C  lidocaine-prilocaine (EMLA) cream SMARTSIG:sparingly Topical As Directed 10/28/21   [provider]  Melatonin 10 MG TABS Take 10 mg by mouth at bedtime as needed (sleep).    [provider]  nitroGLYCERIN (NITROSTAT) 0.4 MG SL tablet Place 0.4 mg under the tongue every 5 (five) minutes as needed for chest pain. 12/30/21   [provider]  pantoprazole (PROTONIX) 40 MG tablet Take 1 tablet (40 mg total) by mouth daily. 02/01/22 04/02/22  Oswald Hillock, MD  traMADol (ULTRAM) 50 MG tablet Take 50-100 mg by mouth 3 (three) times daily  as needed for severe pain.    [provider]  zolpidem (AMBIEN) 5 MG tablet Take 5 mg by mouth at bedtime. 01/05/22   [provider]      Allergies    Abilify [aripiprazole], Remeron [mirtazapine], Trazodone and nefazodone, Flexeril [cyclobenzaprine], and Amoxicillin    Review of Systems   Review of Systems  Physical Exam Updated Vital Signs BP (!) 129/104 (BP Location: Left Arm)   Pulse 89   Resp 20   SpO2 100%  Physical Exam Constitutional:      General: She is not in acute distress.    Comments: Lying on stretcher repeating "help me".  HENT:     Head: Normocephalic and atraumatic.  Eyes:     Conjunctiva/sclera: Conjunctivae normal.     Pupils: Pupils are equal, round, and reactive to light.  Cardiovascular:     Rate and Rhythm: Normal rate and regular rhythm.  Pulmonary:     Effort: Pulmonary effort is normal. No respiratory distress.  Abdominal:     General: There is no distension.     Tenderness: There is no abdominal tenderness.  Skin:    General: Skin is warm and dry.  Neurological:     General: No focal deficit present.     Mental Status: She is alert. Mental status is at baseline.  Psychiatric:        Mood and Affect: Mood normal.        Behavior: Behavior normal.     ED Results / Procedures / Treatments   Labs (all labs ordered are listed, but only abnormal results are displayed) Labs Reviewed - No data to display  EKG None  Radiology CT Abdomen Pelvis Wo Contrast  Result Date: 02/23/2022 CLINICAL DATA:  Abdominal pain, acute, nonlocalized. Per EMS: pt coming from dialysis w/ c/o nausea. Was given 4 mg zofran and then smoked a cigarette and is now nauseas Patient altered and unable to hold still or follow breathing command EXAM: CT ABDOMEN AND PELVIS WITHOUT CONTRAST TECHNIQUE: Multidetector CT imaging of the abdomen and pelvis was performed following the standard protocol without IV contrast. RADIATION DOSE REDUCTION: This exam was  performed according to the departmental dose-optimization program which includes automated exposure control, adjustment of the mA and/or kV according to patient size and/or use of iterative reconstruction technique. COMPARISON:  CT abdomen pelvis 02/21/2022 FINDINGS: Limited evaluation due to motion artifact. Lower chest: Coronary calcification.  No acute abnormality. Hepatobiliary: No focal liver abnormality. Status post cholecystectomy. No biliary dilatation. Pancreas: No focal lesion. Normal pancreatic contour. No surrounding inflammatory changes. No main pancreatic ductal dilatation. Spleen: Normal in size without focal abnormality. Adrenals/Urinary Tract: Markedly limited evaluation of known left adrenal gland adenoma that demonstrates a density of 12 Hounsfield units. No right adrenal nodularity. No nephrolithiasis and no hydronephrosis. Fluid dense lesion within left kidney likely represents a simple renal cyst. Simple renal cysts,  in the absence of clinically indicated signs/symptoms, require no independent follow-up. No ureterolithiasis or hydroureter. The urinary bladder is unremarkable. Stomach/Bowel: Stomach is within normal limits. No evidence of bowel wall thickening or dilatation. Scattered colonic diverticulosis. Stool throughout the ascending and transverse colon. The appendix is not definitely identified with no inflammatory changes in the right lower quadrant to suggest acute appendicitis. Vascular/Lymphatic: No abdominal aorta or iliac aneurysm. Severe atherosclerotic plaque of the aorta and its branches. No abdominal, pelvic, or inguinal lymphadenopathy. Reproductive: Status post hysterectomy. No adnexal masses. Other: No intraperitoneal free fluid. No intraperitoneal free gas. No organized fluid collection. Musculoskeletal: Abdominal hernia repair with mesh. Bulging of the mesh noted into the umbilicus with no definite recurrent hernia. No suspicious lytic or blastic osseous lesions. No acute  displaced fracture. L4-L5 posterolateral interbody fusion with associated laminectomy. Severe right hip degenerative changes. At least moderate left hip degenerative changes. IMPRESSION: 1. Colonic diverticulosis with no acute diverticulitis. 2. Severe degenerative changes of the right hip. 3.  Aortic Atherosclerosis (ICD10-I70.0). 4. Limited evaluation on this noncontrast study with motion artifact. The appendix is not definitely identified with no inflammatory changes in the right lower quadrant to suggest acute appendicitis. Electronically Signed   By: Iven Finn M.D.   On: 02/23/2022 22:55   DG Chest Portable 1 View  Result Date: 02/23/2022 CLINICAL DATA:  Shortness of breath EXAM: PORTABLE CHEST 1 VIEW COMPARISON:  02/21/2022 FINDINGS: Cardiac shadow is mildly prominent. Aortic calcifications are seen. Right jugular dialysis catheter is noted. The lungs are well aerated without focal infiltrate. Chronic scarring in the left mid lung is noted. No bony abnormality is seen. Postsurgical changes in the cervical spine are noted. IMPRESSION: No acute abnormality noted. Electronically Signed   By: Inez Catalina M.D.   On: 02/23/2022 19:23    Procedures Procedures    Medications Ordered in ED Medications  oxyCODONE-acetaminophen (PERCOCET/ROXICET) 5-325 MG per tablet 1 tablet (has no administration in time range)    ED Course/ Medical Decision Making/ A&P                           Medical Decision Making Risk Prescription drug management.   Patient is presenting back with continued complaint of pain after very thorough work-up in emergency department yesterday evening.  I reviewed his labs and work-up, including chest x-ray, CT scan, blood work, which showed no acute or emergent findings.  Unfortunately she does appear to suffer from chronic pain, and has as documented several times in her chart.  I will give her a Percocet here, but explained she needs to follow-up with her doctor otherwise  as an outpatient for continued pain control, not through the ER.  She verbalized understanding.  I do not see an emergent indication to repeat this blood work or work-up from last night.  From a dialysis perspective she does not present volume overload and does not need emergent dialysis.  She was already given a bus token.  She will be asked to leave        Final Clinical Impression(s) / ED Diagnoses Final diagnoses:  Chronic pain syndrome    Rx / DC Orders ED Discharge Orders     None         Nicolas Banh, Carola Rhine, MD 02/24/22 [phone]

## 2022-02-24 NOTE — Discharge Instructions (Signed)
Call your pain doctor.

## 2022-02-24 NOTE — Discharge Instructions (Signed)
Please call your doctor's office to discuss her chronic pain.  Make sure that you follow-up for dialysis normally tomorrow, and continue taking your normal medications at home.

## 2022-03-29 IMAGING — CT CT L SPINE W/O CM
3 of 4 series · 13 of 35 positions shown, 16 images · non-contrast
Comparison: 05/20/2021

CLINICAL DATA: Low back pain, symptoms persist with > 6 wks
treatment

EXAM:
CT LUMBAR SPINE WITHOUT CONTRAST
TECHNIQUE: Multidetector CT imaging of the lumbar spine was performed without
intravenous contrast administration. Multiplanar CT image
reconstructions were also generated.

[Series 7: sagittal st · sagittal · 0.40mm/px · 5 of 63 slices shown, 6 images]
[im 11/63  bone]
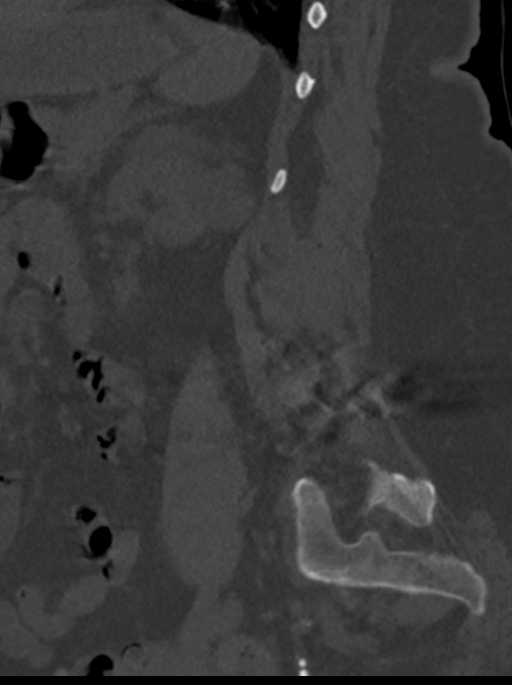
[im 21/63  soft-tissue]
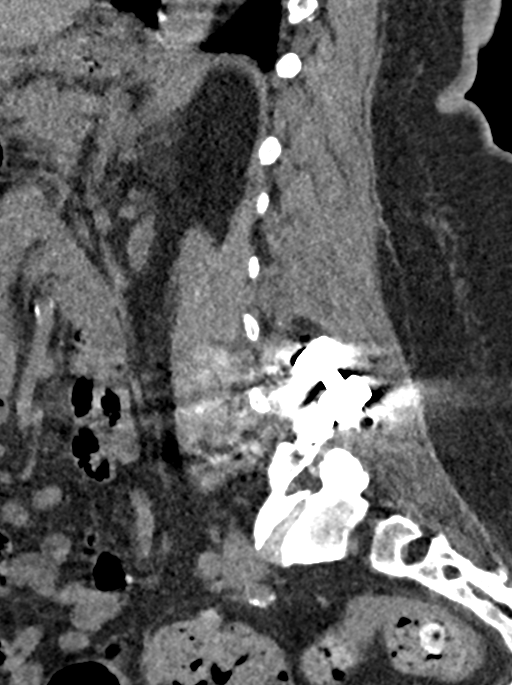
[im 21/63  bone]
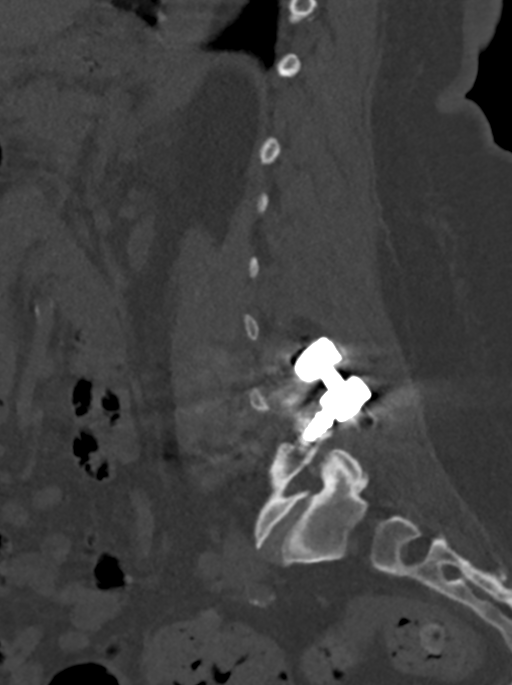
[im 32/63  bone]
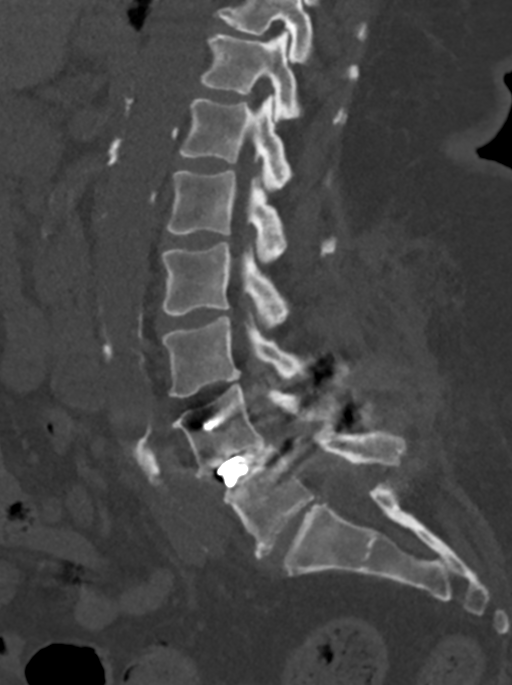
[im 42/63  bone]
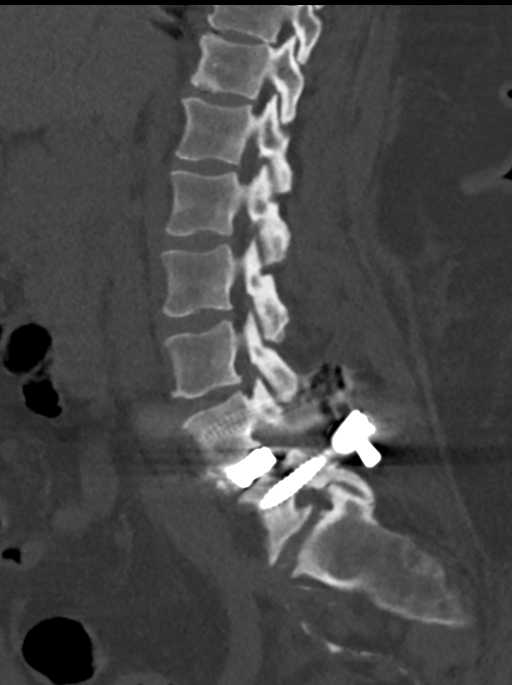
[im 52/63  bone]
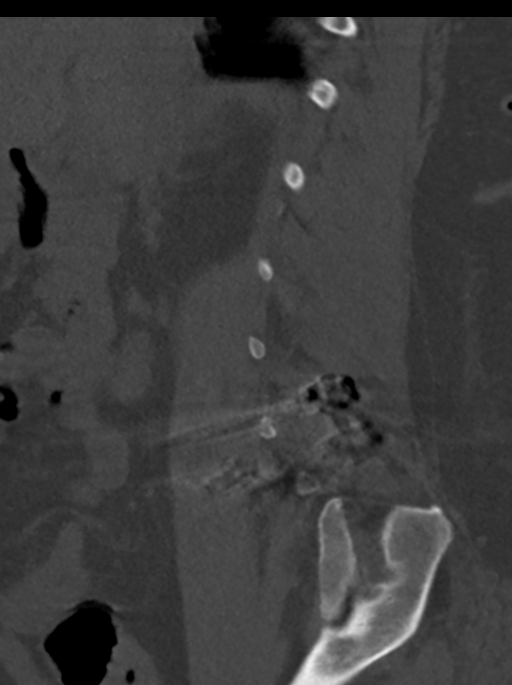

[Series 8: coronal st · coronal · 0.36mm/px · 3 of 61 slices shown]
[im 13/61  bone]
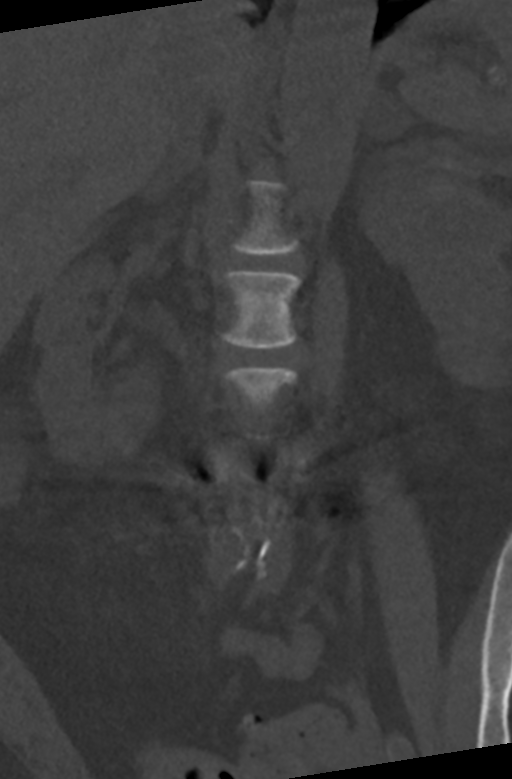
[im 25/61  bone]
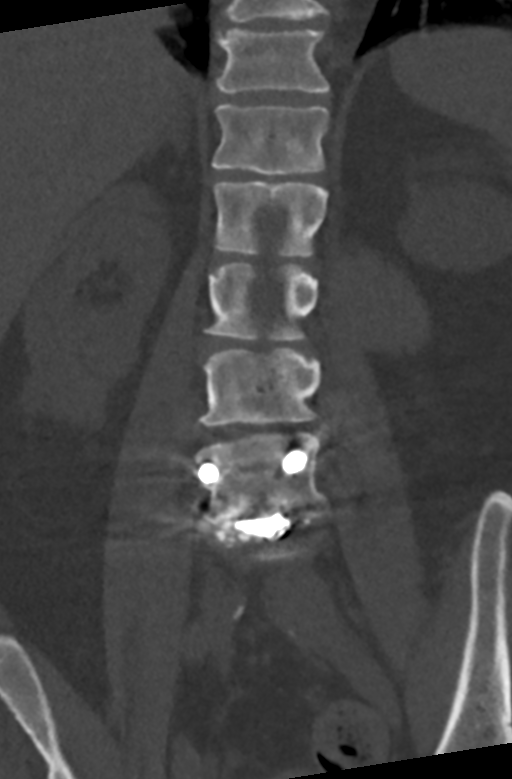
[im 37/61  bone]
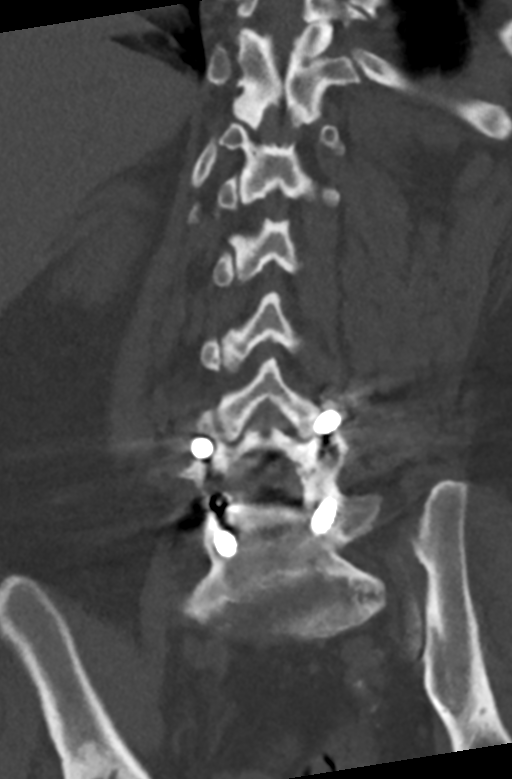

[Series 11: l spine 2.0 (person_name) (person_name) · axial · 0.37mm/px · z∈[+951,+1141]mm · 5 of 138 slices shown, 7 images]
[im 22/138  soft-tissue]
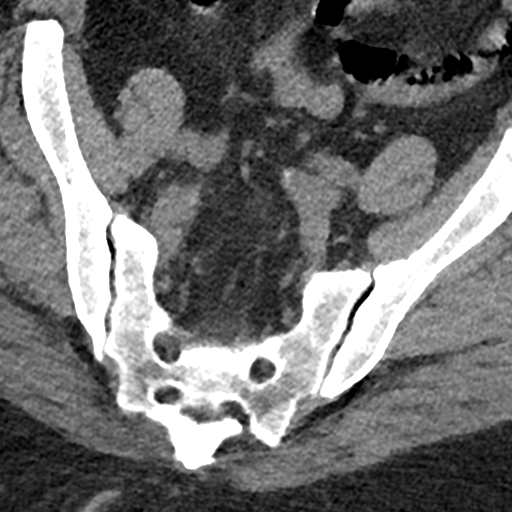
[im 22/138  bone]
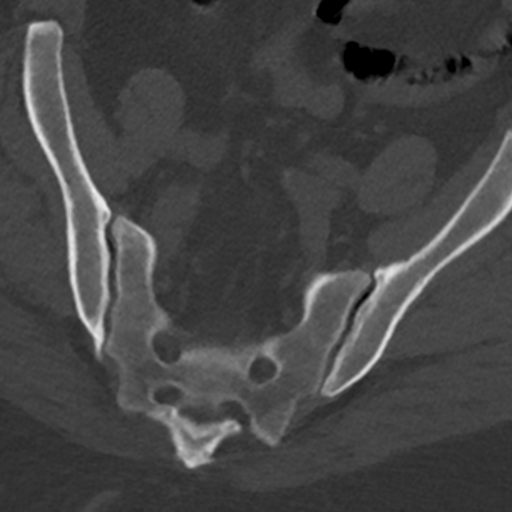
[im 43/138  bone]
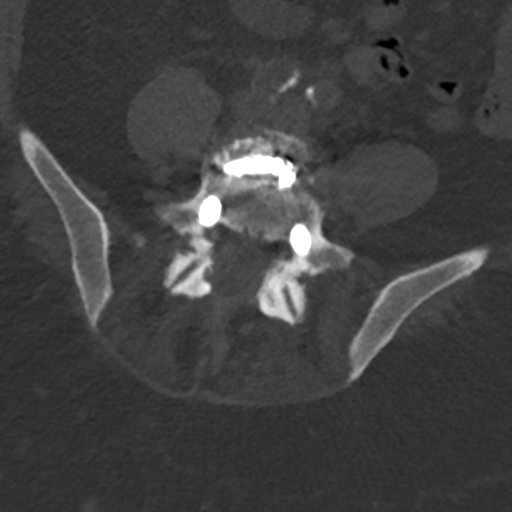
[im 74/138  bone]
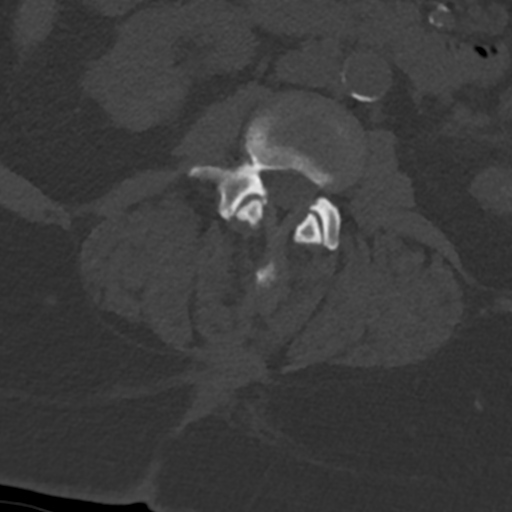
[im 95/138  bone]
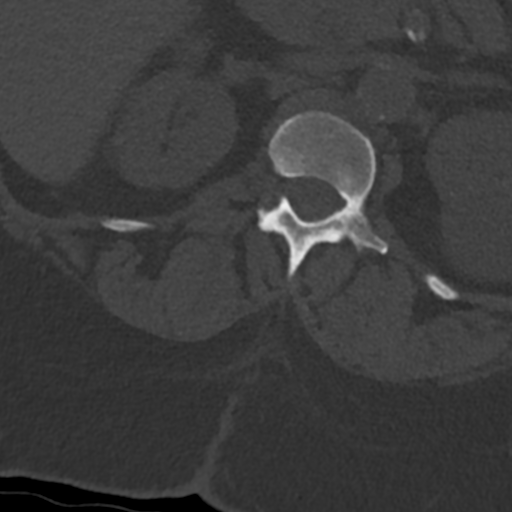
[im 116/138  soft-tissue]
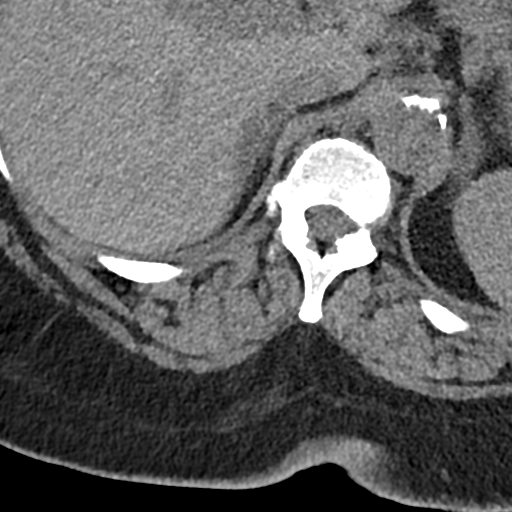
[im 116/138  bone]
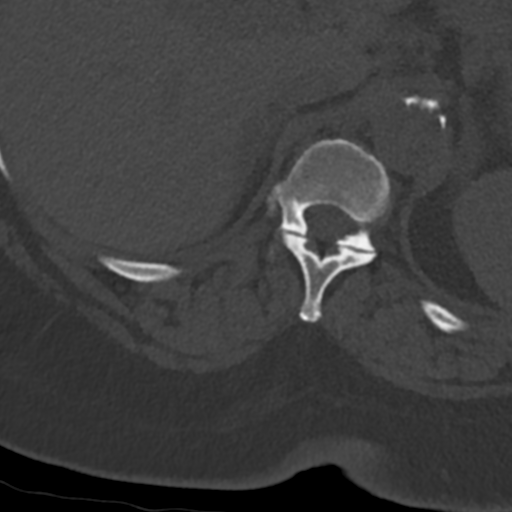

[13 of 35 positions shown; findings below may reference images not displayed]

FINDINGS: Segmentation: 5 lumbar type vertebrae.

Alignment: Stable.

Vertebrae: Stable vertebral body heights postoperative changes are
again identified at L4-L5 with rods and pedicle screws and interbody
spacer. There is associated streak artifact. No evidence of hardware
complication. Vertebral body heights are stable.

Paraspinal and other soft tissues: Chronic postoperative changes in
the dorsal subcutaneous fat. Aortic atherosclerosis.

Disc levels: Stable appearance. As before there is limited
evaluation of the spinal canal at the operative level due to
artifact.
IMPRESSION: No acute abnormality or significant change since recent prior study.

## 2022-06-06 IMAGING — DX DG CHEST 1V PORT
1 series · 1 of 1 positions shown · non-contrast
Comparison: 08/26/2021.

CLINICAL DATA: Chest pain, back pain, shortness of breath.

EXAM:
PORTABLE CHEST 1 VIEW

[chest]
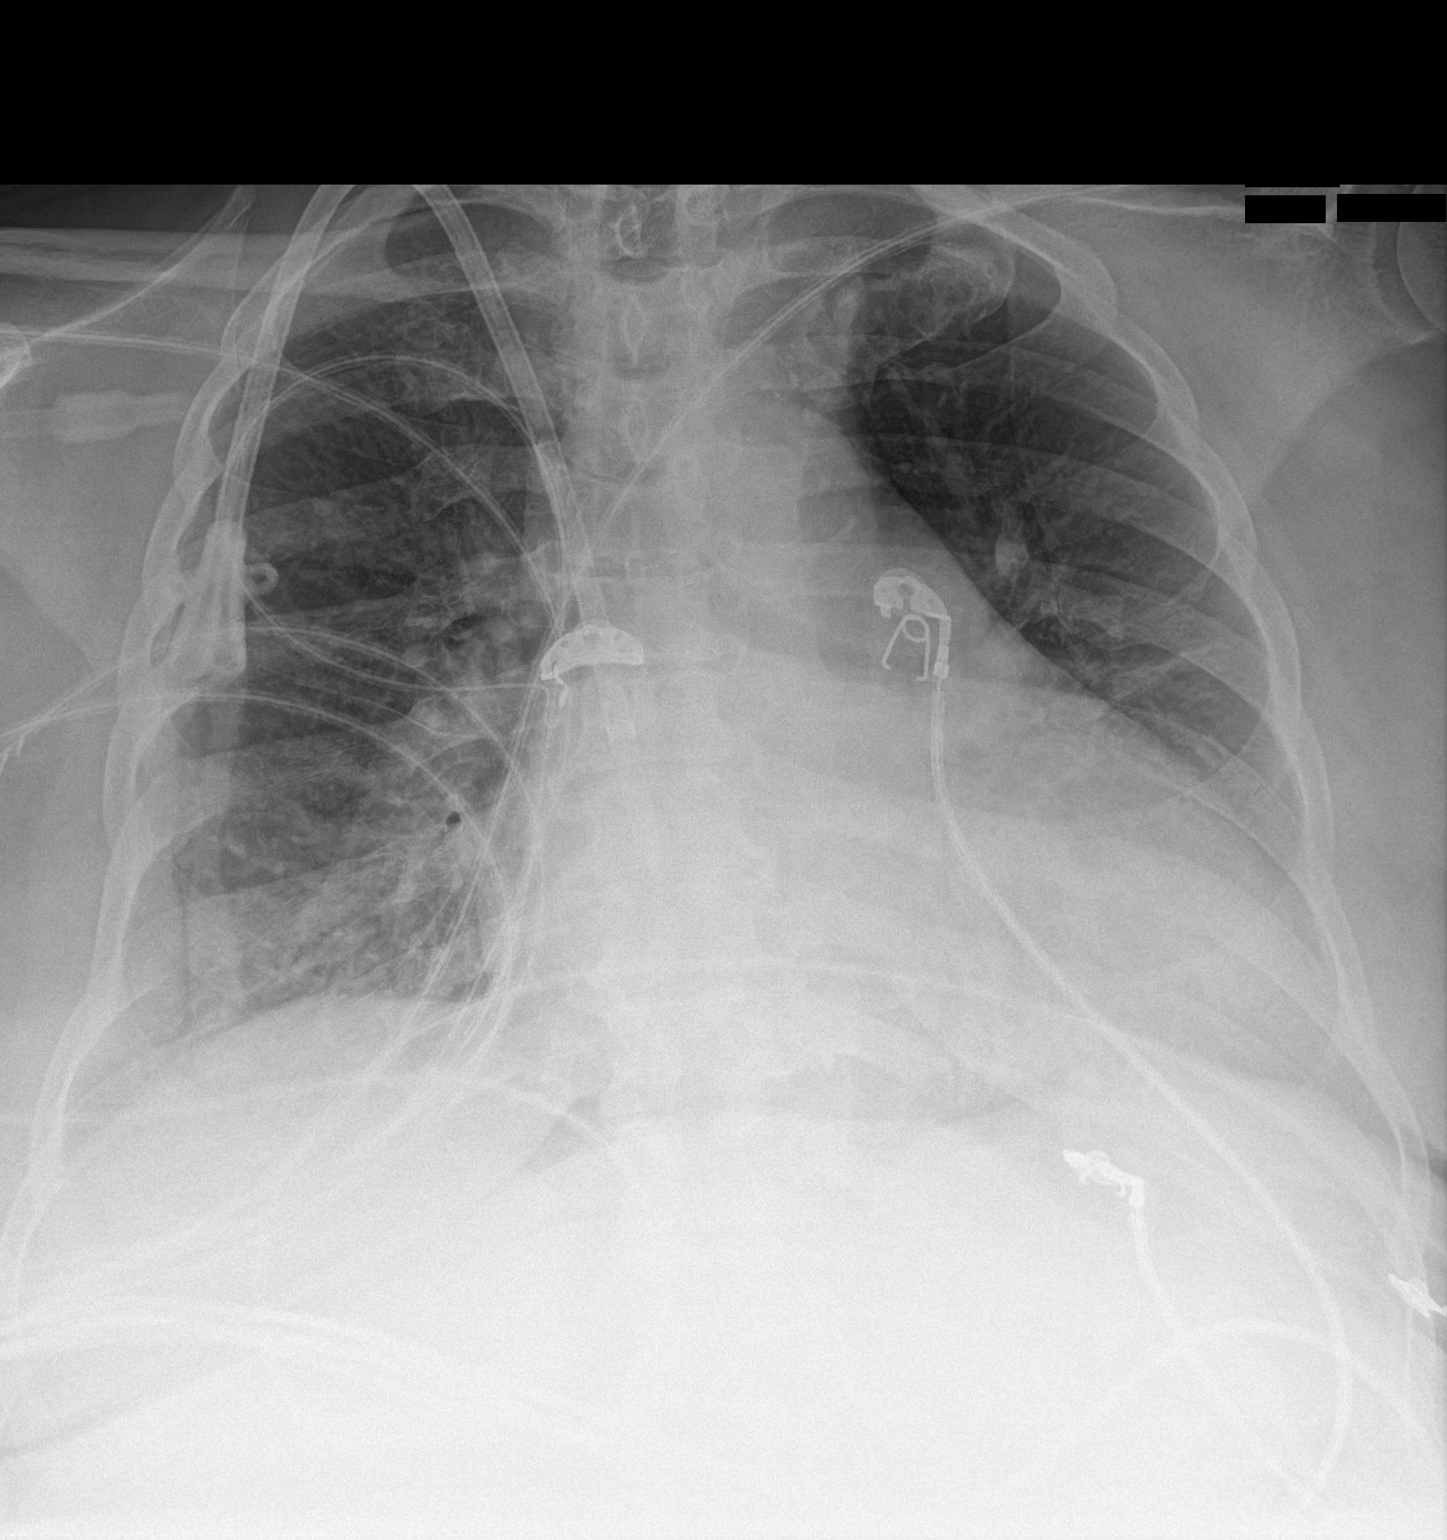

[1 of 1 positions shown; findings below may reference images not displayed]

FINDINGS: The heart is enlarged and the mediastinal contour is stable.
Atherosclerotic calcification of the aorta is noted. Lung volumes
are low and stable subsegmental atelectasis or scarring is present
in the mid left lung. No effusion or pneumothorax. A right internal
jugular central venous catheter is stable. Cervical spinal fusion
hardware is noted.
IMPRESSION: 1. Cardiomegaly.
2. Stable subsegmental atelectasis or scarring in the mid left lung.

## 2022-12-01 ENCOUNTER — Encounter (HOSPITAL_COMMUNITY): Payer: Self-pay

## 2022-12-01 ENCOUNTER — Emergency Department (HOSPITAL_BASED_OUTPATIENT_CLINIC_OR_DEPARTMENT_OTHER)
Admission: EM | Admit: 2022-12-01 | Discharge: 2022-12-01 | Disposition: A | Discharge status: Home / Self Care | Payer: Medicare HMO | Attending: Emergency Medicine | Admitting: Emergency Medicine

## 2022-12-01 ENCOUNTER — Other Ambulatory Visit (HOSPITAL_BASED_OUTPATIENT_CLINIC_OR_DEPARTMENT_OTHER): Payer: Self-pay

## 2022-12-01 ENCOUNTER — Other Ambulatory Visit: Payer: Self-pay

## 2022-12-01 DIAGNOSIS — M5441 Lumbago with sciatica, right side: Secondary | ICD-10-CM | POA: Diagnosis not present

## 2022-12-01 DIAGNOSIS — G2401 Drug induced subacute dyskinesia: Secondary | ICD-10-CM | POA: Insufficient documentation

## 2022-12-01 DIAGNOSIS — I132 Hypertensive heart and chronic kidney disease with heart failure and with stage 5 chronic kidney disease, or end stage renal disease: Secondary | ICD-10-CM | POA: Insufficient documentation

## 2022-12-01 DIAGNOSIS — I5032 Chronic diastolic (congestive) heart failure: Secondary | ICD-10-CM | POA: Insufficient documentation

## 2022-12-01 DIAGNOSIS — M545 Low back pain, unspecified: Secondary | ICD-10-CM | POA: Diagnosis present

## 2022-12-01 DIAGNOSIS — Z992 Dependence on renal dialysis: Secondary | ICD-10-CM | POA: Diagnosis not present

## 2022-12-01 DIAGNOSIS — N186 End stage renal disease: Secondary | ICD-10-CM | POA: Insufficient documentation

## 2022-12-01 DIAGNOSIS — G8929 Other chronic pain: Secondary | ICD-10-CM | POA: Insufficient documentation

## 2022-12-01 MED ORDER — OXYCODONE-ACETAMINOPHEN 5-325 MG PO TABS
1.0000 | ORAL_TABLET | Freq: Four times a day (QID) | ORAL | 0 refills | Status: DC | PRN
  Filled 2022-12-01: qty 5, 2d supply, fill #0

## 2022-12-01 MED ORDER — OXYCODONE-ACETAMINOPHEN 5-325 MG PO TABS
2.0000 | ORAL_TABLET | Freq: Once | ORAL | Status: AC
  Administered 2022-12-01: 2 via ORAL
  Filled 2022-12-01: qty 2

## 2022-12-01 MED ORDER — PREDNISONE 50 MG PO TABS
60.0000 mg | ORAL_TABLET | Freq: Once | ORAL | Status: AC
  Administered 2022-12-01: 60 mg via ORAL
  Filled 2022-12-01: qty 1

## 2022-12-01 MED ORDER — PREDNISONE 50 MG PO TABS
50.0000 mg | ORAL_TABLET | Freq: Every day | ORAL | 0 refills | Status: AC
  Filled 2022-12-01: qty 5, 5d supply, fill #0

## 2022-12-01 NOTE — ED Provider Notes (Signed)
[facility] AT [facility] Provider Note   CSN: [id number] Arrival date & time: 12/01/22  1044     History  Chief Complaint  Patient presents with   Back Pain    Right lower    Sherry [name] is a 67 y.o. female with history of anxiety, GERD, chronic diastolic heart failure, tardive dyskinesia, hypertension, alcohol use disorder, hepatitis C, bipolar disorder, polysubstance abuse, chronic back pain, ESRD on hemodialysis MWF, who presents the emergency department complaining of lower back pain for the past 3 days.  No inciting injury.  States that she has had similar pain in the past, had 2 different spine procedures within the past few years.  Initially she felt the pain might have been related to dialysis and thinking that they may have taken off too much fluid, but she had a full treatment yesterday and did fine.  Pain is radiating down the back of her right leg.  No numbness or weakness.  No urinary retention, urine or bowel incontinence.   Back Pain      Home Medications Prior to Admission medications   Medication Sig Start Date End Date Taking? Authorizing Provider  ACETAMINOPHEN EXTRA STRENGTH 500 MG tablet Take 500 mg by mouth every 6 (six) hours as needed for pain. 12/30/21   [provider]  albuterol (VENTOLIN HFA) 108 (90 Base) MCG/ACT inhaler Inhale 2 puffs into the lungs every 4 (four) hours as needed for wheezing or shortness of breath. 04/23/21   [provider]  BELBUCA 600 MCG FILM Take 1 Film by mouth 2 (two) times daily. 01/12/22   [provider]  BELBUCA 750 MCG FILM SMARTSIG:1 Strip(s) By Mouth Twice Daily PRN 02/06/22   [provider]  FLUoxetine (PROZAC) 40 MG capsule Take 40 mg by mouth in the morning. 11/09/18   [provider]  gabapentin (NEURONTIN) 400 MG capsule Take 400 mg by mouth 2 (two) times daily. 12/28/21   [provider]  hydrALAZINE (APRESOLINE) 50 MG tablet Take 50  mg by mouth 3 (three) times daily. 03/24/21   [provider]  INGREZZA 80 MG CAPS Take 80 mg by mouth at bedtime. 10/15/17   [provider]  isosorbide dinitrate (ISORDIL) 20 MG tablet Take 20 mg by mouth 3 (three) times daily. 12/30/21   [provider]  labetalol (NORMODYNE) 200 MG tablet Take 200 mg by mouth 2 (two) times daily. 12/26/21   [provider]  lidocaine (LIDODERM) 5 % Place 1 patch onto the skin daily. Remove & Discard patch within 12 hours or as directed by MD Patient taking differently: Place 1 patch onto the skin daily as needed (back pain). Remove & Discard patch within 12 hours or as directed by MD 12/27/21   Marita Kansas, PA-C  lidocaine-prilocaine (EMLA) cream SMARTSIG:sparingly Topical As Directed 10/28/21   [provider]  Melatonin 10 MG TABS Take 10 mg by mouth at bedtime as needed (sleep).    [provider]  nitroGLYCERIN (NITROSTAT) 0.4 MG SL tablet Place 0.4 mg under the tongue every 5 (five) minutes as needed for chest pain. 12/30/21   [provider]  oxyCODONE-acetaminophen (PERCOCET/ROXICET) 5-325 MG tablet Take 1 tablet by mouth every 6 (six) hours as needed for severe pain. 12/01/22  Yes Heleena Miceli T, PA-C  pantoprazole (PROTONIX) 40 MG tablet Take 1 tablet (40 mg total) by mouth daily. 02/01/22 04/02/22  Meredeth Ide, MD  predniSONE (DELTASONE) 50 MG tablet Take 1  tablet (50 mg total) by mouth daily for 5 days. 12/01/22 12/06/22 Yes Kaylon Laroche T, PA-C  traMADol (ULTRAM) 50 MG tablet Take 50-100 mg by mouth 3 (three) times daily as needed for severe pain.    [provider]  zolpidem (AMBIEN) 5 MG tablet Take 5 mg by mouth at bedtime. 01/05/22   [provider]      Allergies    Abilify [aripiprazole], Remeron [mirtazapine], Trazodone and nefazodone, Flexeril [cyclobenzaprine], and Amoxicillin    Review of Systems   Review of Systems  Musculoskeletal:  Positive for back pain.   All other systems reviewed and are negative.   Physical Exam Updated Vital Signs BP (!) 143/95   Pulse 79   Temp 98.2 F (36.8 C)   Resp 16   Wt 73.9 kg   SpO2 100%   BMI 29.81 kg/m  Physical Exam Vitals and nursing note reviewed.  Constitutional:      Appearance: Normal appearance.  HENT:     Head: Normocephalic and atraumatic.  Eyes:     Conjunctiva/sclera: Conjunctivae normal.  Pulmonary:     Effort: Pulmonary effort is normal. No respiratory distress.  Musculoskeletal:     Comments: No midline spinal tenderness, step offs or crepitus. No focal tenderness  5/5 strength in all extremities, sensation intact  Skin:    General: Skin is warm and dry.  Neurological:     Mental Status: She is alert.     Comments: Tardive dyskinesia of the face  Psychiatric:        Mood and Affect: Mood normal.        Behavior: Behavior normal.     ED Results / Procedures / Treatments   Labs (all labs ordered are listed, but only abnormal results are displayed) Labs Reviewed - No data to display  EKG None  Radiology No results found.  Procedures Procedures    Medications Ordered in ED Medications  predniSONE (DELTASONE) tablet 60 mg (60 mg Oral Given 12/01/22 1115)  oxyCODONE-acetaminophen (PERCOCET/ROXICET) 5-325 MG per tablet 2 tablet (2 tablets Oral Given 12/01/22 1115)    ED Course/ Medical Decision Making/ A&P                             Medical Decision Making Risk Prescription drug management.   This patient is a 67 y.o. female who presents to the ED for concern of low back pain x 3 days.   Differential diagnoses prior to evaluation: Fracture (acute/chronic), muscle strain, cauda equina, spinal stenosis, DDD, ligamentous injury, disk herniation, metastatic cancer, vertebral osteomyelitis, kidney stone, pyelonephritis, AAA, pancreatitis, bowel obstruction, meningitis.  Past Medical History / Social History / Additional history: Chart reviewed. Pertinent  results include: ESRD on hemodialysis, chronic back pain, chronic diastolic heart failure, polysubstance use, bipolar disorder, tardive dyskinesia due to antipsychotics, hepatitis C, breast cancer  Numerous ER visits with pattern of chronic pain, both back and abdominal. Most recent lumbar XR on 3/24 which was normal post-operative findings, chronic right hip arthritis, no acute findings. Reports that she sees a pain management doctor, Dr. Malen Gauze.  There has been concern for drug-seeking behavior.  Physical Exam: Physical exam performed. The pertinent findings include: Normal vital signs, no acute distress.  Patient appears uncomfortable in the exam bed.  No specific reproducible tenderness to palpation of the back.  No midline spinal tenderness, step-offs or crepitus.  No numbness or weakness.  No saddle paresthesia.  Medications / Treatment:  Given dose of oral pain medication and steroids   Disposition: After consideration of the diagnostic results and the patients response to treatment, I feel that emergency department workup does not suggest an emergent condition requiring admission or immediate intervention beyond what has been performed at this time. The plan is: discharge to home with symptomatic management of likely chronic back pain. Pt requesting pain medication to last until her pain management appointment in 2 days. PDMP reviewed and consistent with patient report. Will give a few tablets of pain medication, and course of prednisone. No red flag symptoms. Symptoms are consistent with patient's prior pain. Recommended follow up with PCP, pain management, and spine specialist. The patient is safe for discharge and has been instructed to return immediately for worsening symptoms, change in symptoms or any other concerns.  Final Clinical Impression(s) / ED Diagnoses Final diagnoses:  Chronic right-sided low back pain with right-sided sciatica    Rx / DC Orders ED Discharge Orders           Ordered    predniSONE (DELTASONE) 50 MG tablet  Daily        12/01/22 1133    oxyCODONE-acetaminophen (PERCOCET/ROXICET) 5-325 MG tablet  Every 6 hours PRN        12/01/22 1133           Portions of this report may have been transcribed using voice recognition software. Every effort was made to ensure accuracy; however, inadvertent computerized transcription errors may be present.    Jeanella Flattery 12/01/22 1355    Terrilee Files, MD 12/02/22 1005

## 2022-12-01 NOTE — ED Triage Notes (Signed)
Right lower back pain radiating to leg x 3 days . Hx sciatica .  No urinary symptoms

## 2022-12-01 NOTE — ED Notes (Signed)
ED Provider at bedside. 

## 2022-12-01 NOTE — Discharge Instructions (Addendum)
You were seen in the emergency department for back pain.  I think it is likely coming from the muscles and nerves in your lower back. We have given you pain medication and steroids, and I'm sending a prescription for steroids and a few tablets of pain medicine. It is important you follow up with your spine specialist and your pain management doctor for management of your pain. They may recommend another spine procedure if it continues to bother you.   Continue to monitor how you're doing and return to the ER for new or worsening symptoms such as numbness in your legs or groin, difficulty using the bathroom (can't pee or having accidents on yourself), etc.

## 2022-12-01 NOTE — ED Notes (Signed)
Pt requested for prescription for pain meds multiple times while this nurse was in the room. EDP made aware

## 2023-01-02 ENCOUNTER — Encounter (HOSPITAL_BASED_OUTPATIENT_CLINIC_OR_DEPARTMENT_OTHER): Payer: Self-pay | Admitting: Emergency Medicine

## 2023-01-02 ENCOUNTER — Emergency Department (HOSPITAL_BASED_OUTPATIENT_CLINIC_OR_DEPARTMENT_OTHER): Payer: Medicare HMO

## 2023-01-02 ENCOUNTER — Other Ambulatory Visit: Payer: Self-pay

## 2023-01-02 ENCOUNTER — Emergency Department (HOSPITAL_BASED_OUTPATIENT_CLINIC_OR_DEPARTMENT_OTHER)
Admission: EM | Admit: 2023-01-02 | Discharge: 2023-01-02 | Disposition: A | Discharge status: Home / Self Care | Payer: Medicare HMO | Attending: Emergency Medicine | Admitting: Emergency Medicine

## 2023-01-02 DIAGNOSIS — Z992 Dependence on renal dialysis: Secondary | ICD-10-CM | POA: Diagnosis not present

## 2023-01-02 DIAGNOSIS — I5032 Chronic diastolic (congestive) heart failure: Secondary | ICD-10-CM | POA: Diagnosis not present

## 2023-01-02 DIAGNOSIS — I132 Hypertensive heart and chronic kidney disease with heart failure and with stage 5 chronic kidney disease, or end stage renal disease: Secondary | ICD-10-CM | POA: Insufficient documentation

## 2023-01-02 DIAGNOSIS — G8918 Other acute postprocedural pain: Secondary | ICD-10-CM | POA: Diagnosis not present

## 2023-01-02 DIAGNOSIS — G459 Transient cerebral ischemic attack, unspecified: Secondary | ICD-10-CM | POA: Insufficient documentation

## 2023-01-02 DIAGNOSIS — Z853 Personal history of malignant neoplasm of breast: Secondary | ICD-10-CM | POA: Diagnosis not present

## 2023-01-02 DIAGNOSIS — N186 End stage renal disease: Secondary | ICD-10-CM | POA: Insufficient documentation

## 2023-01-02 DIAGNOSIS — E278 Other specified disorders of adrenal gland: Secondary | ICD-10-CM | POA: Diagnosis not present

## 2023-01-02 DIAGNOSIS — R109 Unspecified abdominal pain: Secondary | ICD-10-CM | POA: Diagnosis present

## 2023-01-02 MED ORDER — SODIUM CHLORIDE 0.9 % IV BOLUS: 1000.0000 mL | Freq: Once | INTRAVENOUS | Status: DC

## 2023-01-02 MED ORDER — MORPHINE SULFATE (PF) 4 MG/ML IV SOLN
4.0000 mg | Freq: Once | INTRAVENOUS | Status: DC
  Filled 2023-01-02: qty 1

## 2023-01-02 MED ORDER — MORPHINE SULFATE (PF) 4 MG/ML IV SOLN
4.0000 mg | Freq: Once | INTRAVENOUS | Status: AC
  Administered 2023-01-02: 4 mg via INTRAMUSCULAR

## 2023-01-02 MED ORDER — ACETAMINOPHEN 325 MG PO TABS: 650.0000 mg | ORAL_TABLET | Freq: Four times a day (QID) | ORAL | 0 refills | Status: DC | PRN

## 2023-01-02 MED ORDER — OXYCODONE HCL 5 MG PO TABS: 5.0000 mg | ORAL_TABLET | Freq: Four times a day (QID) | ORAL | 0 refills | Status: DC | PRN

## 2023-01-02 MED ORDER — ONDANSETRON HCL 4 MG/2ML IJ SOLN
4.0000 mg | Freq: Once | INTRAMUSCULAR | Status: DC
  Filled 2023-01-02: qty 2

## 2023-01-02 MED ORDER — ONDANSETRON 4 MG PO TBDP
4.0000 mg | ORAL_TABLET | Freq: Once | ORAL | Status: AC
  Administered 2023-01-02: 4 mg via ORAL
  Filled 2023-01-02: qty 1

## 2023-01-02 NOTE — ED Notes (Signed)
Refused to have labs drawn. States, " I was in the hospital for 16 days and the Dr. Evaristo Bury look at those". ED MD informed of same

## 2023-01-02 NOTE — ED Triage Notes (Signed)
Abd surgery 2 weeks ago for SBO. C/o ongoing low abd pain, worsening last night. Endorses nausea.

## 2023-01-02 NOTE — ED Notes (Signed)
Patient transported to CT 

## 2023-01-02 NOTE — ED Provider Notes (Signed)
[place] [facility] AT [facility] Provider Note  CSN: [id number] Arrival date & time: 01/02/23 0915  Chief Complaint(s) Abdominal Pain  HPI Sherry [name] is a 67 y.o. female with past medical history as below, significant for alcohol abuse, hypertension, GERD, ESRD on HD MWF, TIA, opiate dependence tardive dyskinesia who presents to the ED with complaint of abdominal pain, ran out of pain meds.  Patient was admitted earlier this month 6/7 secondary to small bowel obstruction, she is status post ex lap with small bowel resection Dr. Laural Benes general surgery [facility].  Patient reports the emergency department today secondary to worsening abdominal pain over the past 24 hours.  Some nausea without vomiting.  No change in bowel or bladder function.  No fevers.  pain around her surgical site is midline.  No drainage from the surgical site.  No redness.  No falls.  Past Medical History Past Medical History:  Diagnosis Date   Alcohol use disorder, severe, dependence (HCC) 08/27/2017   Anxiety    Body mass index (BMI) 40.0-44.9, adult (HCC) 07/19/2020   Breast cancer (HCC)    right lumpectemy and lymph node    Cervical spondylosis 05/24/2020   Chronic bilateral low back pain with right-sided sciatica 05/24/2020   Chronic diastolic CHF (congestive heart failure) (HCC) 05/09/2019   Cigarette smoker 12/31/2017   ESRD (end stage renal disease) on dialysis (HCC) 08/22/2021   M-W-F   Essential hypertension 07/06/2017   GERD (gastroesophageal reflux disease)    Hepatitis C virus infection cured after antiviral drug therapy 12/17/2012   Telephone Encounter - Theophilus Kinds, RN - 12/28/2016 9:54 AM EDT Patient called for HCV RNA test results. EOT 08-26-16 - not detected. 12 week post treatment 12-11-16 - not detected. Informed patient that she was cured. Patient verbalized understanding.          Hx of bipolar disorder 01/31/2016   Impaired functional mobility, balance, gait, and endurance  01/09/2021   MRSA infection    of breast incision   Neuroleptic-induced tardive dyskinesia 09/06/2017   Abilify   Opioid use disorder, severe, dependence (HCC) 08/27/2017   Restless legs syndrome 02/13/2013   Formatting of this note might be different from the original. IMPRESSION: Possible. Will follow.   Subacute dyskinesia due to drug 02/13/2013   Formatting of this note might be different from the original. STORY: Tardive dyskinesia and mild limb dyskinesia, LE>UE which likely due to prolonged antipsychotic used, abilify. Couldn't tolerate artane, gabapentin, clonazepam and xenazine.`E1o3L`IMPRESSION: Well tolerated depakote 250mg  bid, mood aspect is better as well. Will increase to 500mg  bid. ADR was discussed.  RTC 4-6 weeks.   TIA (transient ischemic attack) 2020   P/w BP >230/120 and neurologic symptoms, presumed TIA   Patient Active Problem List   Diagnosis Date Noted   Abdominal pain 01/29/2022   ESRD on dialysis (HCC) 01/29/2022   Dialysis AV fistula malfunction, initial encounter (HCC) 01/29/2022   Status post lumbar surgery 06/06/2021   COVID-19 virus infection 04/05/2021   Hypertensive urgency 04/04/2021   Tardive dyskinesia 04/04/2021   Hyperkalemia 04/03/2021   Impaired functional mobility, balance, gait, and endurance 01/09/2021   Spondylolisthesis, lumbar region 10/21/2020   Anxiety    Bipolar and related disorder (HCC)    Blood transfusion without reported diagnosis    Breast cancer (HCC)    CHF (congestive heart failure) (HCC)    Chronic back pain    Chronic kidney disease    Depression    Dyspnea    Hepatitis  C    MRSA infection    Opioid dependence (HCC)    Substance abuse (HCC)    Body mass index (BMI) 40.0-44.9, adult (HCC) 07/19/2020   Chest pain 05/31/2020   Near syncope 05/29/2020   Chest pain, rule out acute myocardial infarction 05/29/2020   Cervical spondylosis 05/24/2020   Clonus 05/24/2020   Neck pain 05/24/2020   Chronic bilateral low back  pain with right-sided sciatica 05/24/2020   Arthropathy of lumbar facet joint 05/24/2020   CKD (chronic kidney disease) stage 4, GFR 15-29 ml/min (HCC) 04/18/2020   ARF (acute renal failure) (HCC) 04/09/2020   Nausea 04/09/2020   Chronic diastolic CHF (congestive heart failure) (HCC) 05/09/2019   GERD (gastroesophageal reflux disease) 05/09/2019   Alcohol abuse 05/09/2019   Hypertension 05/08/2019   Bilateral low back pain with bilateral sciatica 01/05/2019   TIA (transient ischemic attack) 2020   Cyst of ovary    Pre-operative cardiovascular examination 06/13/2018   Dizziness 02/24/2018   Left-sided weakness 02/24/2018   Cigarette smoker 12/31/2017   DOE (dyspnea on exertion) 12/30/2017   Phlebitis after infusion 10/28/2017   Hypertensive emergency 10/21/2017   Microcytic anemia 10/21/2017   Medication intolerance 09/06/2017   Neuroleptic-induced tardive dyskinesia 09/06/2017   Cancer (HCC) 09/06/2017   Chronic low back pain 09/06/2017   Grief reaction with prolonged bereavement 09/06/2017   Opioid use disorder, severe, dependence (HCC) 08/27/2017   Alcohol use disorder, severe, dependence (HCC) 08/27/2017   Substance induced mood disorder (HCC) 08/13/2017   MDD (major depressive disorder), recurrent severe, without psychosis (HCC)    Alcohol withdrawal (HCC) 08/09/2017   Essential hypertension 07/06/2017   Chronic kidney disease, stage III (moderate) (HCC) 11/17/2016   Depression with anxiety 08/21/2016   Hx of bipolar disorder 01/31/2016   Oral aphthous ulcer 06/25/2015   History of breast cancer 02/13/2013   Morbid obesity due to excess calories (HCC) 02/13/2013   Restless legs syndrome 02/13/2013   Subacute dyskinesia due to drug 02/13/2013   Dyspnea and respiratory abnormality 02/13/2013   Hepatitis C virus infection cured after antiviral drug therapy 12/17/2012   Home Medication(s) Prior to Admission medications   Medication Sig Start Date End Date Taking?  Authorizing Provider  acetaminophen (TYLENOL) 325 MG tablet Take 2 tablets (650 mg total) by mouth every 6 (six) hours as needed. 01/02/23  Yes Tanda Rockers A, DO  oxyCODONE (ROXICODONE) 5 MG immediate release tablet Take 1 tablet (5 mg total) by mouth every 6 (six) hours as needed for severe pain. 01/02/23  Yes Tanda Rockers A, DO  albuterol (VENTOLIN HFA) 108 (90 Base) MCG/ACT inhaler Inhale 2 puffs into the lungs every 4 (four) hours as needed for wheezing or shortness of breath. 04/23/21   [provider]  BELBUCA 600 MCG FILM Take 1 Film by mouth 2 (two) times daily. 01/12/22   [provider]  BELBUCA 750 MCG FILM SMARTSIG:1 Strip(s) By Mouth Twice Daily PRN 02/06/22   [provider]  FLUoxetine (PROZAC) 40 MG capsule Take 40 mg by mouth in the morning. 11/09/18   [provider]  gabapentin (NEURONTIN) 400 MG capsule Take 400 mg by mouth 2 (two) times daily. 12/28/21   [provider]  hydrALAZINE (APRESOLINE) 50 MG tablet Take 50 mg by mouth 3 (three) times daily. 03/24/21   [provider]  INGREZZA 80 MG CAPS Take 80 mg by mouth at bedtime. 10/15/17   [provider]  isosorbide dinitrate (ISORDIL) 20 MG tablet Take 20 mg by  mouth 3 (three) times daily. 12/30/21   [provider]  labetalol (NORMODYNE) 200 MG tablet Take 200 mg by mouth 2 (two) times daily. 12/26/21   [provider]  lidocaine (LIDODERM) 5 % Place 1 patch onto the skin daily. Remove & Discard patch within 12 hours or as directed by MD Patient taking differently: Place 1 patch onto the skin daily as needed (back pain). Remove & Discard patch within 12 hours or as directed by MD 12/27/21   Marita Kansas, PA-C  lidocaine-prilocaine (EMLA) cream SMARTSIG:sparingly Topical As Directed 10/28/21   [provider]  Melatonin 10 MG TABS Take 10 mg by mouth at bedtime as needed (sleep).    [provider]  nitroGLYCERIN (NITROSTAT) 0.4 MG SL tablet  Place 0.4 mg under the tongue every 5 (five) minutes as needed for chest pain. 12/30/21   [provider]  pantoprazole (PROTONIX) 40 MG tablet Take 1 tablet (40 mg total) by mouth daily. 02/01/22 04/02/22  Meredeth Ide, MD  traMADol (ULTRAM) 50 MG tablet Take 50-100 mg by mouth 3 (three) times daily as needed for severe pain.    [provider]  zolpidem (AMBIEN) 5 MG tablet Take 5 mg by mouth at bedtime. 01/05/22   [provider]                                                                                                                                    Past Surgical History Past Surgical History:  Procedure Laterality Date   A/V FISTULAGRAM Right 01/29/2022   Procedure: A/V Fistulagram;  Surgeon: Cephus Shelling, MD;  Location: [facility] INVASIVE CV LAB;  Service: Cardiovascular;  Laterality: Right;   AV FISTULA PLACEMENT Right 05/01/2021   Procedure: RIGHT ARTERIOVENOUS (AV) FISTULA CREATION;  Surgeon: Victorino Sparrow, MD;  Location: [facility] OR;  Service: Vascular;  Laterality: Right;  PERIPHERAL NERVE BLOCK   BACK SURGERY  1990   BASCILIC VEIN TRANSPOSITION Right 09/02/2021   Procedure: RIGHT SECOND STAGE BASILIC VEIN TRANSPOSITION;  Surgeon: Victorino Sparrow, MD;  Location: [facility] OR;  Service: Vascular;  Laterality: Right;  PERIPHERAL NERVE BLOCK   BREAST SURGERY Right 2010   "breast cancer survivor" - states partial mastectomy and nodes   COLONOSCOPY     [facility]   ESOPHAGOGASTRODUODENOSCOPY     [facility]   ESOPHAGOGASTRODUODENOSCOPY  04/18/2020   [facility]   LAPAROSCOPIC CHOLECYSTECTOMY  2002   LAPAROSCOPIC INCISIONAL / UMBILICAL / VENTRAL HERNIA REPAIR  04/13/2018   with BARD 15x 20cm mesh (supraumbilical)   LAPAROSCOPIC LYSIS OF ADHESIONS  07/12/2018   Procedure: LAPAROSCOPIC LYSIS OF ADHESIONS;  Surgeon: Shonna Chock, MD;  Location: [facility] ORS;  Service: Gynecology;;   MULTIPLE TOOTH EXTRACTIONS     MYOMECTOMY     x 2 prior to  hysterectomy   PERIPHERAL VASCULAR BALLOON ANGIOPLASTY Right 01/29/2022   Procedure: PERIPHERAL VASCULAR BALLOON  ANGIOPLASTY;  Surgeon: Cephus Shelling, MD;  Location: [facility] INVASIVE CV LAB;  Service: Cardiovascular;  Laterality: Right;  arm fistula   ROBOTIC ASSISTED BILATERAL SALPINGO OOPHERECTOMY Right 07/12/2018   Procedure: XI ROBOTIC ASSISTED RIGHT SALPINGO OOPHORECTOMY;  Surgeon: Shonna Chock, MD;  Location: [facility] ORS;  Service: Gynecology;  Laterality: Right;   TOTAL ABDOMINAL HYSTERECTOMY     fibroids    UPPER GASTROINTESTINAL ENDOSCOPY     WISDOM TOOTH EXTRACTION     Family History Family History  Problem Relation Age of Onset   Heart attack Mother    Breast cancer Mother 17   Dementia Mother    Cancer Father 36       died of bleeding from kidneys   Colon cancer Neg Hx    Esophageal cancer Neg Hx    Stomach cancer Neg Hx    Rectal cancer Neg Hx     Social History Social History   Tobacco Use   Smoking status: Every Day    Packs/day: .5    Types: Cigarettes    Passive exposure: Never   Smokeless tobacco: Never  Vaping Use   Vaping Use: Former   Start date: 06/27/2013   Quit date: 06/14/2017   Devices: Apple Cinnamon-0 mg  Substance Use Topics   Alcohol use: Not Currently    Comment: hx over 30 years   Drug use: Not Currently    Comment: h/o IVD use   Allergies Abilify [aripiprazole], Remeron [mirtazapine], Trazodone and nefazodone, Flexeril [cyclobenzaprine], and Amoxicillin  Review of Systems Review of Systems  Constitutional:  Negative for chills and fever.  HENT:  Negative for facial swelling and trouble swallowing.   Eyes:  Negative for photophobia and visual disturbance.  Respiratory:  Negative for cough and shortness of breath.   Cardiovascular:  Negative for chest pain and palpitations.  Gastrointestinal:  Positive for abdominal pain and nausea. Negative for vomiting.  Endocrine: Negative for polydipsia and polyuria.  Genitourinary:  Negative  for difficulty urinating and hematuria.  Musculoskeletal:  Negative for gait problem and joint swelling.  Skin:  Negative for pallor and rash.  Neurological:  Negative for syncope and headaches.  Psychiatric/Behavioral:  Negative for agitation and confusion.     Physical Exam Vital Signs  I have reviewed the triage vital signs BP (!) 158/90   Pulse 81   Temp 98.2 F (36.8 C) (Oral)   Resp 16   Ht 5\' 2"  (1.575 m)   Wt 70.8 kg   SpO2 100%   BMI 28.53 kg/m  Physical Exam Vitals and nursing note reviewed.  Constitutional:      General: She is not in acute distress.    Appearance: Normal appearance. She is well-developed.  HENT:     Head: Normocephalic and atraumatic.     Right Ear: External ear normal.     Left Ear: External ear normal.     Nose: Nose normal.     Mouth/Throat:     Mouth: Mucous membranes are moist.  Eyes:     General: No scleral icterus.       Right eye: No discharge.        Left eye: No discharge.  Cardiovascular:     Rate and Rhythm: Normal rate and regular rhythm.     Pulses: Normal pulses.     Heart sounds: Normal heart sounds.  Pulmonary:     Effort: Pulmonary effort is normal. No respiratory distress.     Breath sounds: Normal breath sounds.  Abdominal:     General: Abdomen is flat.     Palpations: Abdomen is soft.     Tenderness: There is abdominal tenderness. There is no guarding or rebound.       Comments: Nonperitoneal  Musculoskeletal:        General: Normal range of motion.     Cervical back: Normal range of motion.     Right lower leg: No edema.     Left lower leg: No edema.  Skin:    General: Skin is warm and dry.     Capillary Refill: Capillary refill takes less than 2 seconds.  Neurological:     Mental Status: She is alert and oriented to person, place, and time.     GCS: GCS eye subscore is 4. GCS verbal subscore is 5. GCS motor subscore is 6.     Comments: Tardive dyskinesia  Psychiatric:        Mood and Affect: Mood  normal.        Behavior: Behavior normal.     ED Results and Treatments Labs (all labs ordered are listed, but only abnormal results are displayed) Labs Reviewed  CBC WITH DIFFERENTIAL/PLATELET  COMPREHENSIVE METABOLIC PANEL  LIPASE, BLOOD  URINALYSIS, ROUTINE W REFLEX MICROSCOPIC                                                                                                                          Radiology CT ABDOMEN PELVIS WO CONTRAST  Result Date: 01/02/2023 CLINICAL DATA:  Nonlocalized abdominal pain. EXploratory laparotomy and small bowel resection with stapled anastomosis on 12/18/2022. Hemodialysis patient. EXAM: CT ABDOMEN AND PELVIS WITHOUT CONTRAST TECHNIQUE: Multidetector CT imaging of the abdomen and pelvis was performed following the standard protocol without IV contrast. RADIATION DOSE REDUCTION: This exam was performed according to the departmental dose-optimization program which includes automated exposure control, adjustment of the mA and/or kV according to patient size and/or use of iterative reconstruction technique. COMPARISON:  12/24/2022 FINDINGS: Lower chest: Dependent atelectasis.  Trace left pleural effusion. Hepatobiliary: No suspicious focal abnormality in the liver on this study without intravenous contrast. Cholecystectomy. Stable common bile duct diameter at 7 mm. Pancreas: No focal mass lesion. No dilatation of the main duct. No intraparenchymal cyst. No peripancreatic edema. Spleen: No splenomegaly. No suspicious focal mass lesion. Adrenals/Urinary Tract: 17 mm left adrenal nodule previously characterized as adenoma. No followup imaging is recommended. Right adrenal gland unremarkable. Atrophic left kidney with tiny mildly hyperattenuating lesions compatible with hemorrhagic or proteinaceous cyst. No followup imaging is recommended. Similar high attenuation lesions in the left kidney with 4.4 cm water density lesion suggesting simple cyst. No followup imaging is  recommended. No evidence for hydroureter. The urinary bladder appears normal for the degree of distention. Stomach/Bowel: Stomach is unremarkable. No gastric wall thickening. No evidence of outlet obstruction. Duodenum is normally positioned as is the ligament of Treitz. No small bowel wall thickening. No small bowel dilatation. Small bowel anastomosis noted left abdomen. No  free fluid in the mesentery. The terminal ileum is normal. The appendix is normal. No gross colonic mass. No colonic wall thickening. Fecal contents in the left colon are opacified with contrast material. Diverticular changes are noted in the left colon without evidence of diverticulitis. Vascular/Lymphatic: There is moderate atherosclerotic calcification of the abdominal aorta without aneurysm. There is no gastrohepatic or hepatoduodenal ligament lymphadenopathy. No retroperitoneal or mesenteric lymphadenopathy. No pelvic sidewall lymphadenopathy. Reproductive: Hysterectomy.  There is no adnexal mass. Other: No intraperitoneal free fluid.  No intraperitoneal free air. Musculoskeletal: Ventral mesh evident. Subcutaneous midline well-defined homogeneous 2.5 x 2.2 x 4.6 cm collection is mildly increased in the interval. This approaches fluid attenuation and may be a seroma at the incision site. Advanced degenerative changes noted right hip. Lower lumbar fusion hardware evident. IMPRESSION: 1. No acute findings in the abdomen or pelvis. Specifically, no findings to explain the patient's history of abdominal pain. 2. Recent exploratory laparotomy with small bowel anastomosis. No evidence for bowel obstruction. No small bowel wall thickening. No mesenteric or intraperitoneal free fluid. No free intraperitoneal air. No focal intraperitoneal fluid collection to suggest abscess. 3. Subcutaneous midline well-defined homogeneous 2.5 x 2.2 x 4.6 cm collection is mildly increased in the interval. This approaches fluid attenuation and may be a seroma at the  incision site. Superinfection cannot be excluded by CT imaging. 4. Trace left pleural effusion with dependent atelectasis. 5. 17 mm left adrenal nodule has attenuation too high to be characterized as an adenoma. While this may be a lipid poor adenoma, MRI abdomen could be used for more definitive characterization as clinically warranted. 6.  Aortic Atherosclerosis (ICD10-I70.0). Electronically Signed   By: Kennith Center M.D.   On: 01/02/2023 11:54    Pertinent labs & imaging results that were available during my care of the patient were reviewed by me and considered in my medical decision making (see MDM for details).  Medications Ordered in ED Medications  ondansetron (ZOFRAN-ODT) disintegrating tablet 4 mg (4 mg Oral Given 01/02/23 1142)  morphine (PF) 4 MG/ML injection 4 mg (4 mg Intramuscular Given 01/02/23 1142)                                                                                                                                     Procedures Procedures  (including critical care time)  Medical Decision Making / ED Course    Medical Decision Making:    Zykira Matlack is a 67 y.o. female with past medical history as below, significant for alcohol abuse, hypertension, GERD, ESRD on HD MWF, TIA, opiate dependence tardive dyskinesia who presents to the ED with complaint of abdominal pain, ran out of pain meds. . The complaint involves an extensive differential diagnosis and also carries with it a high risk of complications and morbidity.  Serious etiology was considered. Ddx includes but is not limited to: Differential diagnosis includes but is not exclusive to acute  cholecystitis, intrathoracic causes for epigastric abdominal pain, gastritis, duodenitis, pancreatitis, small bowel or large bowel obstruction, abdominal aortic aneurysm, hernia, gastritis, etc. Differential diagnosis includes but is not exclusive to ectopic pregnancy, ovarian cyst, ovarian torsion, acute appendicitis,  urinary tract infection, endometriosis, bowel obstruction, hernia, colitis, renal colic, gastroenteritis, volvulus etc.   Complete initial physical exam performed, notably the patient  was HDS, no acute distress abdomen is nonperitoneal.    Reviewed and confirmed nursing documentation for past medical history, family history, social history.  Vital signs reviewed.    Clinical Course as of 01/02/23 1227  Sat Jan 02, 2023  1212 CTAP stable from prior. Pt refused to have labs drawn [SG]  1220 She is feeling much better, ambulatory to restroom. Tolerating PO. Requesting discharge  [SG]    Clinical Course User Index [SG] Sloan Leiter, DO   Patient here for abdominal pain for the past 2 days, ran out of her Percocet few days ago.  She had SBO with small bowel resection earlier this month at [facility]  Patient refused labs taking or having IV placed.  She is agreeable to CT imaging.  CT imaging obtained, MPRESSION:  1. No acute findings in the abdomen or pelvis. Specifically, no  findings to explain the patient's history of abdominal pain.  2. Recent exploratory laparotomy with small bowel anastomosis. No  evidence for bowel obstruction. No small bowel wall thickening. No  mesenteric or intraperitoneal free fluid. No free intraperitoneal  air. No focal intraperitoneal fluid collection to suggest abscess.  3. Subcutaneous midline well-defined homogeneous 2.5 x 2.2 x 4.6 cm  collection is mildly increased in the interval. This approaches  fluid attenuation and may be a seroma at the incision site.  Superinfection cannot be excluded by CT imaging.  4. Trace left pleural effusion with dependent atelectasis.  5. 17 mm left adrenal nodule has attenuation too high to be  characterized as an adenoma. While this may be a lipid poor adenoma,  MRI abdomen could be used for more definitive characterization as  clinically warranted.  6.  Aortic Atherosclerosis (ICD10-I70.0).    Agree with radiology  interpretation.  She has no cellulitis at the surgical site, seroma seems more likely given recent surgery.  Less likely superimposed infection.  No abscess.  Adrenal nodule on imaging.  Advised she follow-up with PCP for further evaluation  She is feeling much better, she is ambulatory to the bathroom with a steady gait.  Tolerant p.o. intake.  Feels her pain is improved.  She is requesting discharge.  Again does not want any labs drawn or an IV placed.  Will give her short course of analgesics, advised her to follow-up with her surgical team for recheck and for further management of her postop discomfort  The patient improved significantly and was discharged in stable condition. Detailed discussions were had with the patient regarding current findings, and need for close f/u with PCP or on call doctor. The patient has been instructed to return immediately if the symptoms worsen in any way for re-evaluation. Patient verbalized understanding and is in agreement with current care plan. All questions answered prior to discharge.  Patient did leave prior to receiving her AVS.  I verbally discussed follow-up instructions, care plan and return precautions with the patient at bedside.     Additional history obtained: -Additional history obtained from na -External records from outside source obtained and reviewed including: Chart review including previous notes, labs, imaging, consultation notes including [facility] records, pdmp, prior  labs/imaging  She was prescribed 15 tablets of oxycodone 6/18    Lab Tests: na  EKG   EKG Interpretation  Date/Time:    Ventricular Rate:    PR Interval:    QRS Duration:   QT Interval:    QTC Calculation:   R Axis:     Text Interpretation:           Imaging Studies ordered: I ordered imaging studies including ctap I independently visualized the following imaging with scope of interpretation limited to determining acute life threatening conditions related  to emergency care; findings noted above, significant for no sbo or abscess I independently visualized and interpreted imaging. I agree with the radiologist interpretation   Medicines ordered and prescription drug management: Meds ordered this encounter  Medications   DISCONTD: sodium chloride 0.9 % bolus 1,000 mL   DISCONTD: morphine (PF) 4 MG/ML injection 4 mg   DISCONTD: ondansetron (ZOFRAN) injection 4 mg   ondansetron (ZOFRAN-ODT) disintegrating tablet 4 mg   morphine (PF) 4 MG/ML injection 4 mg   oxyCODONE (ROXICODONE) 5 MG immediate release tablet    Sig: Take 1 tablet (5 mg total) by mouth every 6 (six) hours as needed for severe pain.    Dispense:  10 tablet    Refill:  0   acetaminophen (TYLENOL) 325 MG tablet    Sig: Take 2 tablets (650 mg total) by mouth every 6 (six) hours as needed.    Dispense:  36 tablet    Refill:  0    -I have reviewed the patients home medicines and have made adjustments as needed   Consultations Obtained: na   Cardiac Monitoring: The patient was maintained on a cardiac monitor.  I personally viewed and interpreted the cardiac monitored which showed an underlying rhythm of: SR  Social Determinants of Health:  Diagnosis or treatment significantly limited by social determinants of health: current smoker and obesity Counseled patient for approximately 3 minutes regarding smoking cessation. Discussed risks of smoking and how they applied and affected their visit here today. Patient not ready to quit at this time, however will follow up with their primary doctor when they are.   CPT code: 16109: intermediate counseling for smoking cessation     Reevaluation: After the interventions noted above, I reevaluated the patient and found that they have improved  Co morbidities that complicate the patient evaluation  Past Medical History:  Diagnosis Date   Alcohol use disorder, severe, dependence (HCC) 08/27/2017   Anxiety    Body mass index  (BMI) 40.0-44.9, adult (HCC) 07/19/2020   Breast cancer (HCC)    right lumpectemy and lymph node    Cervical spondylosis 05/24/2020   Chronic bilateral low back pain with right-sided sciatica 05/24/2020   Chronic diastolic CHF (congestive heart failure) (HCC) 05/09/2019   Cigarette smoker 12/31/2017   ESRD (end stage renal disease) on dialysis (HCC) 08/22/2021   M-W-F   Essential hypertension 07/06/2017   GERD (gastroesophageal reflux disease)    Hepatitis C virus infection cured after antiviral drug therapy 12/17/2012   Telephone Encounter - Theophilus Kinds, RN - 12/28/2016 9:54 AM EDT Patient called for HCV RNA test results. EOT 08-26-16 - not detected. 12 week post treatment 12-11-16 - not detected. Informed patient that she was cured. Patient verbalized understanding.          Hx of bipolar disorder 01/31/2016   Impaired functional mobility, balance, gait, and endurance 01/09/2021   MRSA infection    of breast incision  Neuroleptic-induced tardive dyskinesia 09/06/2017   Abilify   Opioid use disorder, severe, dependence (HCC) 08/27/2017   Restless legs syndrome 02/13/2013   Formatting of this note might be different from the original. IMPRESSION: Possible. Will follow.   Subacute dyskinesia due to drug 02/13/2013   Formatting of this note might be different from the original. STORY: Tardive dyskinesia and mild limb dyskinesia, LE>UE which likely due to prolonged antipsychotic used, abilify. Couldn't tolerate artane, gabapentin, clonazepam and xenazine.`E1o3L`IMPRESSION: Well tolerated depakote 250mg  bid, mood aspect is better as well. Will increase to 500mg  bid. ADR was discussed.  RTC 4-6 weeks.   TIA (transient ischemic attack) 2020   P/w BP >230/120 and neurologic symptoms, presumed TIA      Dispostion: Disposition decision including need for hospitalization was considered, and patient discharged from emergency department.    Final Clinical Impression(s) / ED Diagnoses Final  diagnoses:  Post-operative pain  Adrenal nodule (HCC)     This chart was dictated using voice recognition software.  Despite best efforts to proofread,  errors can occur which can change the documentation meaning.    Sloan Leiter, DO 01/02/23 1228

## 2023-01-02 NOTE — ED Notes (Signed)
Attempted IV 1x, was unsuccessful. Could not find other adequate location for IV. EDP informed.

## 2023-01-02 NOTE — Discharge Instructions (Addendum)
It was a pleasure caring for you today in the emergency department.  Please return to the emergency department for any worsening or worrisome symptoms.  Please follow up with your surgical team for evaluation   There was a nodule on your adrenal gland noted on the CAT scan today, please follow-up with your PCP for recheck

## 2023-01-02 NOTE — ED Notes (Signed)
Attempted IV access to right hand, tol well but unsuccessful. ED MD informed

## 2023-01-02 NOTE — ED Notes (Signed)
Pt left prior to receiving discharge papers. EDP provider went over discharge instructions prior to patient leaving.

## 2023-03-02 ENCOUNTER — Other Ambulatory Visit: Payer: Self-pay

## 2023-03-02 ENCOUNTER — Encounter (HOSPITAL_BASED_OUTPATIENT_CLINIC_OR_DEPARTMENT_OTHER): Payer: Self-pay | Admitting: Urology

## 2023-03-02 ENCOUNTER — Emergency Department (HOSPITAL_BASED_OUTPATIENT_CLINIC_OR_DEPARTMENT_OTHER)
Admission: EM | Admit: 2023-03-02 | Discharge: 2023-03-02 | Disposition: A | Discharge status: Home / Self Care | Payer: Medicare HMO | Attending: Emergency Medicine | Admitting: Emergency Medicine

## 2023-03-02 DIAGNOSIS — I5032 Chronic diastolic (congestive) heart failure: Secondary | ICD-10-CM | POA: Insufficient documentation

## 2023-03-02 DIAGNOSIS — Z992 Dependence on renal dialysis: Secondary | ICD-10-CM | POA: Diagnosis not present

## 2023-03-02 DIAGNOSIS — Z853 Personal history of malignant neoplasm of breast: Secondary | ICD-10-CM | POA: Insufficient documentation

## 2023-03-02 DIAGNOSIS — I1311 Hypertensive heart and chronic kidney disease without heart failure, with stage 5 chronic kidney disease, or end stage renal disease: Secondary | ICD-10-CM | POA: Diagnosis not present

## 2023-03-02 DIAGNOSIS — N186 End stage renal disease: Secondary | ICD-10-CM | POA: Insufficient documentation

## 2023-03-02 DIAGNOSIS — M5441 Lumbago with sciatica, right side: Secondary | ICD-10-CM | POA: Diagnosis not present

## 2023-03-02 DIAGNOSIS — F17210 Nicotine dependence, cigarettes, uncomplicated: Secondary | ICD-10-CM | POA: Diagnosis not present

## 2023-03-02 DIAGNOSIS — M5431 Sciatica, right side: Secondary | ICD-10-CM

## 2023-03-02 DIAGNOSIS — M545 Low back pain, unspecified: Secondary | ICD-10-CM | POA: Diagnosis present

## 2023-03-02 MED ORDER — SENNOSIDES-DOCUSATE SODIUM 8.6-50 MG PO TABS: 1.0000 | ORAL_TABLET | Freq: Every evening | ORAL | 0 refills | Status: DC | PRN

## 2023-03-02 MED ORDER — DEXAMETHASONE SODIUM PHOSPHATE 10 MG/ML IJ SOLN
10.0000 mg | Freq: Once | INTRAMUSCULAR | Status: AC
  Administered 2023-03-02: 10 mg via INTRAMUSCULAR
  Filled 2023-03-02: qty 1

## 2023-03-02 MED ORDER — OXYCODONE-ACETAMINOPHEN 5-325 MG PO TABS: 1.0000 | ORAL_TABLET | Freq: Four times a day (QID) | ORAL | 0 refills | Status: DC | PRN

## 2023-03-02 MED ORDER — OXYCODONE-ACETAMINOPHEN 5-325 MG PO TABS
1.0000 | ORAL_TABLET | Freq: Once | ORAL | Status: AC
  Administered 2023-03-02: 1 via ORAL
  Filled 2023-03-02: qty 1

## 2023-03-02 NOTE — ED Provider Notes (Signed)
Emergency Department Provider Note   I have reviewed the triage vital signs and the nursing notes.   HISTORY  Chief Complaint Back Pain   HPI Sherry [name] is a 67 y.o. female past history reviewed below including ESRD presents to the emergency department with lower back pain radiating from the top of the right buttock down the right leg.  Symptoms began 3 to 4 days prior without injury.  She has no associated numbness or weakness in the leg.  Pain limits her gait but she is able to walk.  No groin numbness.  No bowel or bladder incontinence.  She is compliant with her dialysis.  She reports no prior history of sciatica.  She states that she was treated for breast cancer that was 30 years ago without recurrence.   Past Medical History:  Diagnosis Date   Alcohol use disorder, severe, dependence (HCC) 08/27/2017   Anxiety    Body mass index (BMI) 40.0-44.9, adult (HCC) 07/19/2020   Breast cancer (HCC)    right lumpectemy and lymph node    Cervical spondylosis 05/24/2020   Chronic bilateral low back pain with right-sided sciatica 05/24/2020   Chronic diastolic CHF (congestive heart failure) (HCC) 05/09/2019   Cigarette smoker 12/31/2017   ESRD (end stage renal disease) on dialysis (HCC) 08/22/2021   M-W-F   Essential hypertension 07/06/2017   GERD (gastroesophageal reflux disease)    Hepatitis C virus infection cured after antiviral drug therapy 12/17/2012   Telephone Encounter - Theophilus Kinds, RN - 12/28/2016 9:54 AM EDT Patient called for HCV RNA test results. EOT 08-26-16 - not detected. 12 week post treatment 12-11-16 - not detected. Informed patient that she was cured. Patient verbalized understanding.          Hx of bipolar disorder 01/31/2016   Impaired functional mobility, balance, gait, and endurance 01/09/2021   MRSA infection    of breast incision   Neuroleptic-induced tardive dyskinesia 09/06/2017   Abilify   Opioid use disorder, severe, dependence (HCC) 08/27/2017    Restless legs syndrome 02/13/2013   Formatting of this note might be different from the original. IMPRESSION: Possible. Will follow.   Subacute dyskinesia due to drug 02/13/2013   Formatting of this note might be different from the original. STORY: Tardive dyskinesia and mild limb dyskinesia, LE>UE which likely due to prolonged antipsychotic used, abilify. Couldn't tolerate artane, gabapentin, clonazepam and xenazine.`E1o3L`IMPRESSION: Well tolerated depakote 250mg  bid, mood aspect is better as well. Will increase to 500mg  bid. ADR was discussed.  RTC 4-6 weeks.   TIA (transient ischemic attack) 2020   P/w BP >230/120 and neurologic symptoms, presumed TIA    Review of Systems  Constitutional: No fever/chills Cardiovascular: Denies chest pain. Respiratory: Denies shortness of breath. Gastrointestinal: No abdominal pain.  Genitourinary: Negative for dysuria. Musculoskeletal: Positive for back pain and right leg pain.  Skin: Negative for rash. Neurological: Negative for headaches, focal weakness or numbness.   ____________________________________________   PHYSICAL EXAM:  VITAL SIGNS: ED Triage Vitals  Encounter Vitals Group     BP 03/02/23 1905 (!) 160/96     Pulse Rate 03/02/23 1905 87     Resp 03/02/23 1905 20     Temp 03/02/23 1905 98.2 F (36.8 C)     Temp src --      SpO2 03/02/23 1905 96 %     Weight 03/02/23 1902 147 lb (66.7 kg)     Height 03/02/23 1902 5\' 2"  (1.575 m)   Constitutional: Alert and oriented.  Well appearing and in no acute distress. Eyes: Conjunctivae are normal.  Head: Atraumatic. Nose: No congestion/rhinnorhea. Mouth/Throat: Mucous membranes are moist.   Neck: No stridor.   Cardiovascular: Normal rate, regular rhythm. Good peripheral circulation. Grossly normal heart sounds.  Palpable thrill over the patient's left upper extremity HD access.  Respiratory: Normal respiratory effort.  No retractions. Lungs CTAB. Gastrointestinal: Soft and nontender.  No distention.  Musculoskeletal: No lower extremity tenderness nor edema. No gross deformities of extremities. Neurologic:  Normal speech and language.  Normal strength and sensation in the bilateral lower extremities.  Patient is ambulatory with mild limp. Skin:  Skin is warm, dry and intact. No rash noted.   ____________________________________________   PROCEDURES  Procedure(s) performed:   Procedures  None  ____________________________________________   INITIAL IMPRESSION / ASSESSMENT AND PLAN / ED COURSE  Pertinent labs & imaging results that were available during my care of the patient were reviewed by me and considered in my medical decision making (see chart for details).   This patient is Presenting for Evaluation of back pain, which does require a range of treatment options, and is a complaint that involves a high risk of morbidity and mortality.  The Differential Diagnoses includes but is not exclusive to musculoskeletal back pain, renal colic, urinary tract infection, pyelonephritis, intra-abdominal causes of back pain, aortic aneurysm or dissection, cauda equina syndrome, sciatica, lumbar disc disease, thoracic disc disease, etc.   Critical Interventions-    Medications  dexamethasone (DECADRON) injection 10 mg (has no administration in time range)  oxyCODONE-acetaminophen (PERCOCET/ROXICET) 5-325 MG per tablet 1 tablet (has no administration in time range)    Reassessment after intervention: symptoms improved.    Radiologic Tests: Considered spine imaging but patient has no red flag signs or symptoms.  Exam and history seem most consistent with sciatica.  Plan to treat empirically and have patient follow closely with her primary care doctor for spine imaging consideration should symptoms persist.   Medical Decision Making: Summary:  Patient presents emergency department for evaluation of back pain rating down the right leg.  This seems most consistent with  sciatica.  No red flag signs or symptoms to prompt MRI imaging.  Plan for Decadron and small course of Percocet with constipation medication.  She is not diabetic and has tolerated steroids well in the past.  Discussed the potential dependence of Percocet as well as constipation side effects. Will provide contact information for spine surgery on call.   Patient's presentation is most consistent with acute, uncomplicated illness.   Disposition: discharge  ____________________________________________  FINAL CLINICAL IMPRESSION(S) / ED DIAGNOSES  Final diagnoses:  Sciatica of right side    NEW OUTPATIENT MEDICATIONS STARTED DURING THIS VISIT:  New Prescriptions   OXYCODONE-ACETAMINOPHEN (PERCOCET/ROXICET) 5-325 MG TABLET    Take 1 tablet by mouth every 6 (six) hours as needed for severe pain.   SENNA-DOCUSATE (SENOKOT-S) 8.6-50 MG TABLET    Take 1 tablet by mouth at bedtime as needed for mild constipation.    Note:  This document was prepared using Dragon voice recognition software and may include unintentional dictation errors.  Alona Bene, MD, [facility]    Jersie Beel, Arlyss Repress, MD 03/02/23 [id number]

## 2023-03-02 NOTE — Discharge Instructions (Signed)
You have been seen in the Emergency Department (ED)  today for back pain.  Your workup and exam have not shown any acute abnormalities and you are likely suffering from muscle strain or possible problems with your discs, but there is no treatment that will fix your symptoms at this time.    Please follow up with your doctor as soon as possible regarding today's ED visit and your back pain.  Return to the ED for worsening back pain, fever, weakness or numbness of either leg, or if you develop either (1) an inability to urinate or have bowel movements, or (2) loss of your ability to control your bathroom functions (if you start having "accidents"), or if you develop other new symptoms that concern you.  

## 2023-03-02 NOTE — ED Notes (Signed)
EDP at bedside of Pt. With RN ... Pt. Explains that she is a dialysis Pt. And she is having  pain in the lower back and down the leg.  Pt. Is able to walk but the pain is severe per the Pt.   Pt. Reports the R leg is the painful leg.    Pt. Does move the R leg and the L leg and has been walking with pain.

## 2023-03-02 NOTE — ED Notes (Signed)
Waiting on friend to pick her up, meds given dc pending

## 2023-03-02 NOTE — ED Triage Notes (Signed)
Pt states lower back pain x 1 week  Pain with bending over and light exertion  No new injury, no urinary symptoms

## 2023-03-25 ENCOUNTER — Other Ambulatory Visit: Payer: Self-pay | Admitting: *Deleted

## 2023-03-25 ENCOUNTER — Ambulatory Visit (HOSPITAL_COMMUNITY): Admission: RE | Admit: 2023-03-25 | Payer: Medicare HMO | Source: Ambulatory Visit

## 2023-03-25 ENCOUNTER — Ambulatory Visit: Payer: Medicare HMO

## 2023-03-25 DIAGNOSIS — N186 End stage renal disease: Secondary | ICD-10-CM

## 2023-04-03 ENCOUNTER — Encounter (HOSPITAL_BASED_OUTPATIENT_CLINIC_OR_DEPARTMENT_OTHER): Payer: Self-pay | Admitting: Emergency Medicine

## 2023-04-03 ENCOUNTER — Other Ambulatory Visit: Payer: Self-pay

## 2023-04-03 ENCOUNTER — Emergency Department (HOSPITAL_BASED_OUTPATIENT_CLINIC_OR_DEPARTMENT_OTHER)
Admission: EM | Admit: 2023-04-03 | Discharge: 2023-04-03 | Disposition: A | Discharge status: Home / Self Care | Payer: Medicare HMO | Attending: Emergency Medicine | Admitting: Emergency Medicine

## 2023-04-03 ENCOUNTER — Emergency Department (HOSPITAL_BASED_OUTPATIENT_CLINIC_OR_DEPARTMENT_OTHER): Payer: Medicare HMO

## 2023-04-03 DIAGNOSIS — M1611 Unilateral primary osteoarthritis, right hip: Secondary | ICD-10-CM | POA: Diagnosis not present

## 2023-04-03 DIAGNOSIS — I509 Heart failure, unspecified: Secondary | ICD-10-CM | POA: Diagnosis not present

## 2023-04-03 DIAGNOSIS — M545 Low back pain, unspecified: Secondary | ICD-10-CM | POA: Diagnosis present

## 2023-04-03 DIAGNOSIS — Z853 Personal history of malignant neoplasm of breast: Secondary | ICD-10-CM | POA: Insufficient documentation

## 2023-04-03 DIAGNOSIS — Z992 Dependence on renal dialysis: Secondary | ICD-10-CM | POA: Diagnosis not present

## 2023-04-03 DIAGNOSIS — M87051 Idiopathic aseptic necrosis of right femur: Secondary | ICD-10-CM | POA: Insufficient documentation

## 2023-04-03 DIAGNOSIS — N186 End stage renal disease: Secondary | ICD-10-CM | POA: Diagnosis not present

## 2023-04-03 DIAGNOSIS — M8700 Idiopathic aseptic necrosis of unspecified bone: Secondary | ICD-10-CM

## 2023-04-03 MED ORDER — OXYCODONE-ACETAMINOPHEN 5-325 MG PO TABS
1.0000 | ORAL_TABLET | Freq: Four times a day (QID) | ORAL | 0 refills | Status: AC | PRN
Start: 2023-04-03 — End: 2023-04-06

## 2023-04-03 MED ORDER — OXYCODONE-ACETAMINOPHEN 5-325 MG PO TABS
1.0000 | ORAL_TABLET | Freq: Once | ORAL | Status: AC
  Administered 2023-04-03: 1 via ORAL
  Filled 2023-04-03: qty 1

## 2023-04-03 NOTE — Discharge Instructions (Addendum)
Please follow-up with the orthopedic specialist have attached you for you today.  Today your x-ray shows that you have avascular necrosis with bad arthritis in the right hip most likely causing her pain and you will need to see an orthopedic specialist for long-term management.  Please take Tylenol every 6 hours as needed however if prescribed you Percocet to take in case the pain is not controlled by Tylenol.  If symptoms change or worsen please return to ER.

## 2023-04-03 NOTE — ED Notes (Signed)
PT advised she fell 1.5 weeks ago at dialysis. Complains of lower lumbar back pain with radiating pain down her R leg. No urinary incontinence or saddle pain. Pt advised she already received imaging and it was negative and she was discharged. Did not receive an Rx, but took several of her own Tramadol from prior Rx and said it did not work.

## 2023-04-03 NOTE — ED Triage Notes (Signed)
Pt fell 1.5 wks ago and c/o lower back pain; ambulatory w/o difficulty

## 2023-04-03 NOTE — ED Notes (Signed)
I introduced myself to the patient and she advised "do you have any warm blankets, I need two." Pt also asked how much longer will the Provider be, before answering any more questions.

## 2023-04-03 NOTE — ED Provider Notes (Signed)
[place] [facility] Provider Note   CSN: [id number] Arrival date & time: 04/03/23  1514     History  Chief Complaint  Patient presents with   Back Pain    Sherry [name] is a 67 y.o. female history of ESRD on dialysis, TIA, CHF, breast cancer, MRSA presented for low back pain.  Patient states that she went to dialysis last week and states he took too much fluid and when she stood up fell down landed on her butt.  Patient states since then her low back/buttock been hurting.  Patient takes tramadol at home states this does not help.  Patient is able to walk without issue denies change in sensation of smell skills.  Patient denies saddle anesthesia, bowel incontinence, abdominal pain, nausea/vomiting, fevers.  Patient denies chest pain, shortness of breath, fevers  Home Medications Prior to Admission medications   Medication Sig Start Date End Date Taking? Authorizing Provider  acetaminophen (TYLENOL) 325 MG tablet Take 2 tablets (650 mg total) by mouth every 6 (six) hours as needed. 01/02/23   Sloan Leiter, DO  albuterol (VENTOLIN HFA) 108 (90 Base) MCG/ACT inhaler Inhale 2 puffs into the lungs every 4 (four) hours as needed for wheezing or shortness of breath. 04/23/21   [provider]  BELBUCA 600 MCG FILM Take 1 Film by mouth 2 (two) times daily. 01/12/22   [provider]  BELBUCA 750 MCG FILM SMARTSIG:1 Strip(s) By Mouth Twice Daily PRN 02/06/22   [provider]  FLUoxetine (PROZAC) 40 MG capsule Take 40 mg by mouth in the morning. 11/09/18   [provider]  gabapentin (NEURONTIN) 400 MG capsule Take 400 mg by mouth 2 (two) times daily. 12/28/21   [provider]  hydrALAZINE (APRESOLINE) 50 MG tablet Take 50 mg by mouth 3 (three) times daily. 03/24/21   [provider]  INGREZZA 80 MG CAPS Take 80 mg by mouth at bedtime. 10/15/17   [provider]  isosorbide dinitrate (ISORDIL) 20 MG  tablet Take 20 mg by mouth 3 (three) times daily. 12/30/21   [provider]  labetalol (NORMODYNE) 200 MG tablet Take 200 mg by mouth 2 (two) times daily. 12/26/21   [provider]  lidocaine (LIDODERM) 5 % Place 1 patch onto the skin daily. Remove & Discard patch within 12 hours or as directed by MD Patient taking differently: Place 1 patch onto the skin daily as needed (back pain). Remove & Discard patch within 12 hours or as directed by MD 12/27/21   Marita Kansas, PA-C  lidocaine-prilocaine (EMLA) cream SMARTSIG:sparingly Topical As Directed 10/28/21   [provider]  Melatonin 10 MG TABS Take 10 mg by mouth at bedtime as needed (sleep).    [provider]  nitroGLYCERIN (NITROSTAT) 0.4 MG SL tablet Place 0.4 mg under the tongue every 5 (five) minutes as needed for chest pain. 12/30/21   [provider]  oxyCODONE (ROXICODONE) 5 MG immediate release tablet Take 1 tablet (5 mg total) by mouth every 6 (six) hours as needed for severe pain. 01/02/23   Sloan Leiter, DO  oxyCODONE-acetaminophen (PERCOCET/ROXICET) 5-325 MG tablet Take 1 tablet by mouth every 6 (six) hours as needed for up to 3 days for severe pain. 04/03/23 04/06/23  Netta Corrigan, PA-C  pantoprazole (PROTONIX) 40 MG tablet Take 1 tablet (40 mg total) by mouth daily. 02/01/22 04/02/22  Meredeth Ide, MD  senna-docusate (SENOKOT-S) 8.6-50 MG tablet Take 1 tablet  by mouth at bedtime as needed for mild constipation. 03/02/23   Long, Arlyss Repress, MD  traMADol (ULTRAM) 50 MG tablet Take 50-100 mg by mouth 3 (three) times daily as needed for severe pain.    [provider]  zolpidem (AMBIEN) 5 MG tablet Take 5 mg by mouth at bedtime. 01/05/22   [provider]      Allergies    Abilify [aripiprazole], Remeron [mirtazapine], Trazodone and nefazodone, Flexeril [cyclobenzaprine], and Amoxicillin    Review of Systems   Review of Systems  Musculoskeletal:  Positive for back pain.     Physical Exam Updated Vital Signs BP (!) 166/86 (BP Location: Right Arm)   Pulse 87   Temp 98.2 F (36.8 C) (Oral)   Resp 18   Ht 5\' 2"  (1.575 m)   Wt 63.5 kg   SpO2 96%   BMI 25.61 kg/m  Physical Exam Constitutional:      General: She is not in acute distress. Cardiovascular:     Rate and Rhythm: Normal rate.     Pulses: Normal pulses.  Abdominal:     Palpations: Abdomen is soft.     Tenderness: There is no abdominal tenderness. There is no guarding or rebound.  Musculoskeletal:     Comments: No midline tenderness abdomen is palpated Tenderness to right paralumbar musculature without abnormalities There is palpation in the right side of pelvis without abnormalities Soft compartments Pain not out of portion 5 out of 5 bilateral hip flexion/extension  Skin:    General: Skin is warm and dry.  Neurological:     Mental Status: She is alert.     Comments: Sensation intact distally Able to ambulate without difficulty     ED Results / Procedures / Treatments   Labs (all labs ordered are listed, but only abnormal results are displayed) Labs Reviewed - No [name] to display  EKG None  Radiology DG Lumbar Spine Complete  Result Date: 04/03/2023 CLINICAL [name]:  fall EXAM: LUMBAR SPINE - COMPLETE 4+ VIEW COMPARISON:  March 10, 2023 FINDINGS: Status post posterior fixation with intervertebral spacer placement at L4-5. Orthopedic hardware is intact and without periprosthetic fracture or lucency. There are five non-rib bearing lumbar-type vertebral bodies. There is normal alignment. There is no evidence for acute fracture or subluxation. Moderate intervertebral disc space height loss with posterior disc osteophyte complex at L3-4. Mild intervertebral disc space height loss at L5-S1. Pelvic mesh hernia close. Status post cholecystectomy. Sclerosis of the RIGHT femoral head with severe joint space narrowing. Atherosclerotic calcifications. Sacrum is obscured by overlapping bowel  contents. IMPRESSION: 1. Status post posterior fixation at L4-5 without evidence of hardware complication. 2. No acute fracture or subluxation. 3. Likely RIGHT femoral head avascular necrosis with severe joint space narrowing. Electronically Signed   By: Meda Klinefelter M.D.   On: 04/03/2023 16:50    Procedures Procedures    Medications Ordered in ED Medications  oxyCODONE-acetaminophen (PERCOCET/ROXICET) 5-325 MG per tablet 1 tablet (1 tablet Oral Given 04/03/23 1846)    ED Course/ Medical Decision Making/ A&P                                 Medical Decision Making Amount and/or Complexity of [name] Reviewed Radiology: ordered.  Risk Prescription drug management.   Rufina Falco 67 y.o. presented today for back pain. Working DDx that I considered at this time includes, but not limited to, MSK, underlying fracture, epidural  hematoma/abscess, cauda equina syndrome, spinal stenosis, spinal malignancy, discitis, spinal infection, spondylitises/ spondylosis, conus medullaris, DDD of the back.  R/o DDx: MSK, underlying fracture, epidural hematoma/abscess, cauda equina syndrome, spinal stenosis, spinal malignancy, discitis, spinal infection, spondylitises/ spondylosis, conus medullaris, DDD of the back : less likely due to history of present illness, physical exam, labs/imaging findings.  Review of prior external notes: 07/10/2020 ED provider  Unique Tests and My Interpretation:  Lumbar x-ray: Likely right-sided AVN with severe joint space narrowing  Discussion with Independent Historian: None  Discussion of Management of Tests: None  Risk: Medium: prescription drug management  Risk Stratification Score: None  Plan: On exam patient was in no acute distress stable vitals.  Patient did have tenderness to the right side of her low back in the musculature in the left hip but no midline tenderness or abnormalities.  Patient's neuroexam history of physical exam is reassuring.  X-ray  does show the patient has likely right-sided AVN of the right hip which I suspect is what is causing patient's pain was exacerbated when she landed on her back at the dialysis clinic.  Patient does not see orthopedics and will be referred to orthopedics.  Patient states that her at home that did not work after extensive conversation will prescribe few days for the Percocets to help with her suspected AVN knee x-ray also shows severe arthritis as well.  Patient states that she has a ride home and for discharge at her request.  Patient was given return precautions. Patient stable for discharge at this time.  Patient verbalized understanding of plan.         Final Clinical Impression(s) / ED Diagnoses Final diagnoses:  AVN (avascular necrosis of bone) (HCC)  Arthritis of right hip    Rx / DC Orders ED Discharge Orders          Ordered    oxyCODONE-acetaminophen (PERCOCET/ROXICET) 5-325 MG tablet  Every 6 hours PRN        04/03/23 1829              Netta Corrigan, PA-C 04/03/23 1846    Rolan Bucco, MD 04/03/23 [phone]

## 2023-04-06 ENCOUNTER — Emergency Department (HOSPITAL_BASED_OUTPATIENT_CLINIC_OR_DEPARTMENT_OTHER)
Admission: EM | Admit: 2023-04-06 | Discharge: 2023-04-06 | Disposition: A | Discharge status: Home / Self Care | Payer: Medicare HMO | Attending: Emergency Medicine | Admitting: Emergency Medicine

## 2023-04-06 ENCOUNTER — Other Ambulatory Visit: Payer: Self-pay

## 2023-04-06 ENCOUNTER — Encounter (HOSPITAL_BASED_OUTPATIENT_CLINIC_OR_DEPARTMENT_OTHER): Payer: Self-pay

## 2023-04-06 ENCOUNTER — Emergency Department (HOSPITAL_BASED_OUTPATIENT_CLINIC_OR_DEPARTMENT_OTHER): Payer: Medicare HMO

## 2023-04-06 DIAGNOSIS — M48061 Spinal stenosis, lumbar region without neurogenic claudication: Secondary | ICD-10-CM | POA: Diagnosis not present

## 2023-04-06 DIAGNOSIS — M545 Low back pain, unspecified: Secondary | ICD-10-CM | POA: Diagnosis present

## 2023-04-06 DIAGNOSIS — M5441 Lumbago with sciatica, right side: Secondary | ICD-10-CM | POA: Diagnosis not present

## 2023-04-06 DIAGNOSIS — R109 Unspecified abdominal pain: Secondary | ICD-10-CM | POA: Diagnosis not present

## 2023-04-06 DIAGNOSIS — I700 Atherosclerosis of aorta: Secondary | ICD-10-CM | POA: Insufficient documentation

## 2023-04-06 MED ORDER — OXYCODONE HCL 5 MG PO TABS
5.0000 mg | ORAL_TABLET | Freq: Once | ORAL | Status: AC
  Administered 2023-04-06: 5 mg via ORAL
  Filled 2023-04-06: qty 1

## 2023-04-06 MED ORDER — KETOROLAC TROMETHAMINE 15 MG/ML IJ SOLN
15.0000 mg | Freq: Once | INTRAMUSCULAR | Status: AC
  Administered 2023-04-06: 15 mg via INTRAMUSCULAR
  Filled 2023-04-06: qty 1

## 2023-04-06 MED ORDER — ACETAMINOPHEN 500 MG PO TABS
1000.0000 mg | ORAL_TABLET | Freq: Once | ORAL | Status: AC
  Administered 2023-04-06: 1000 mg via ORAL
  Filled 2023-04-06: qty 2

## 2023-04-06 MED ORDER — DICLOFENAC SODIUM 1 % EX GEL: 4.0000 g | Freq: Four times a day (QID) | CUTANEOUS | 0 refills | Status: DC

## 2023-04-06 NOTE — ED Triage Notes (Addendum)
Pt presents with complaint of back pain worsening. Seen Saturday for same complaint . BP 214/104 in triage. Takes BP medication at bedtime

## 2023-04-06 NOTE — ED Notes (Addendum)
Reviewed discharge instructions with pt. Pt states understanding. EDp aware of current BP at discharge. Awaiting son to pick her up

## 2023-04-06 NOTE — ED Provider Notes (Signed)
[facility] Provider Note   CSN: [id number] Arrival date & time: 04/06/23  4098     History  Chief Complaint  Patient presents with   Back Pain    Sherry [name] is a 67 y.o. female.  67 yo F with a chief complaint of right-sided low back pain that radiates in the leg.  This been ongoing since she fell last week.  She was seen in the emergency primary for this and had an x-ray that was concerning for avascular necrosis of the hip.  She has scheduled follow-up with an orthopedist but is not until next month.  She would like pain medicine prescribed for her until she is able to see them.  She denies any loss of bowel or bladder denies loss of perirectal sensation denies numbness or weakness of the leg.   Back Pain      Home Medications Prior to Admission medications   Medication Sig Start Date End Date Taking? Authorizing Provider  diclofenac Sodium (VOLTAREN) 1 % GEL Apply 4 g topically 4 (four) times daily. 04/06/23  Yes Melene Plan, DO  acetaminophen (TYLENOL) 325 MG tablet Take 2 tablets (650 mg total) by mouth every 6 (six) hours as needed. 01/02/23   Sloan Leiter, DO  albuterol (VENTOLIN HFA) 108 (90 Base) MCG/ACT inhaler Inhale 2 puffs into the lungs every 4 (four) hours as needed for wheezing or shortness of breath. 04/23/21   [provider]  BELBUCA 600 MCG FILM Take 1 Film by mouth 2 (two) times daily. 01/12/22   [provider]  BELBUCA 750 MCG FILM SMARTSIG:1 Strip(s) By Mouth Twice Daily PRN 02/06/22   [provider]  FLUoxetine (PROZAC) 40 MG capsule Take 40 mg by mouth in the morning. 11/09/18   [provider]  gabapentin (NEURONTIN) 400 MG capsule Take 400 mg by mouth 2 (two) times daily. 12/28/21   [provider]  hydrALAZINE (APRESOLINE) 50 MG tablet Take 50 mg by mouth 3 (three) times daily. 03/24/21   [provider]  INGREZZA 80 MG CAPS Take 80 mg by mouth at bedtime.  10/15/17   [provider]  isosorbide dinitrate (ISORDIL) 20 MG tablet Take 20 mg by mouth 3 (three) times daily. 12/30/21   [provider]  labetalol (NORMODYNE) 200 MG tablet Take 200 mg by mouth 2 (two) times daily. 12/26/21   [provider]  lidocaine (LIDODERM) 5 % Place 1 patch onto the skin daily. Remove & Discard patch within 12 hours or as directed by MD Patient taking differently: Place 1 patch onto the skin daily as needed (back pain). Remove & Discard patch within 12 hours or as directed by MD 12/27/21   Marita Kansas, PA-C  lidocaine-prilocaine (EMLA) cream SMARTSIG:sparingly Topical As Directed 10/28/21   [provider]  Melatonin 10 MG TABS Take 10 mg by mouth at bedtime as needed (sleep).    [provider]  nitroGLYCERIN (NITROSTAT) 0.4 MG SL tablet Place 0.4 mg under the tongue every 5 (five) minutes as needed for chest pain. 12/30/21   [provider]  oxyCODONE (ROXICODONE) 5 MG immediate release tablet Take 1 tablet (5 mg total) by mouth every 6 (six) hours as needed for severe pain. 01/02/23   Sloan Leiter, DO  oxyCODONE-acetaminophen (PERCOCET/ROXICET) 5-325 MG tablet Take 1 tablet by mouth every 6 (six) hours as needed for up to 3 days for severe pain. 04/03/23 04/06/23  Netta Corrigan, PA-C  pantoprazole (PROTONIX) 40 MG tablet Take 1 tablet (40 mg total) by mouth daily. 02/01/22 04/02/22  Meredeth Ide, MD  senna-docusate (SENOKOT-S) 8.6-50 MG tablet Take 1 tablet by mouth at bedtime as needed for mild constipation. 03/02/23   Long, Arlyss Repress, MD  traMADol (ULTRAM) 50 MG tablet Take 50-100 mg by mouth 3 (three) times daily as needed for severe pain.    [provider]  zolpidem (AMBIEN) 5 MG tablet Take 5 mg by mouth at bedtime. 01/05/22   [provider]      Allergies    Abilify [aripiprazole], Remeron [mirtazapine], Trazodone and nefazodone, Flexeril [cyclobenzaprine], and Amoxicillin    Review of Systems    Review of Systems  Musculoskeletal:  Positive for back pain.    Physical Exam Updated Vital Signs BP (!) 188/113   Pulse 71   Temp 98.2 F (36.8 C) (Oral)   Resp 18   Wt 66.7 kg   SpO2 90%   BMI 26.89 kg/m  Physical Exam Vitals and nursing note reviewed.  Constitutional:      General: She is not in acute distress.    Appearance: She is well-developed. She is not diaphoretic.  HENT:     Head: Normocephalic and atraumatic.  Eyes:     Pupils: Pupils are equal, round, and reactive to light.  Cardiovascular:     Rate and Rhythm: Normal rate and regular rhythm.     Heart sounds: No murmur heard.    No friction rub. No gallop.  Pulmonary:     Effort: Pulmonary effort is normal.     Breath sounds: No wheezing or rales.  Abdominal:     General: There is no distension.     Palpations: Abdomen is soft.     Tenderness: There is no abdominal tenderness.  Musculoskeletal:        General: Tenderness present.     Cervical back: Normal range of motion and neck supple.     Comments: Pain worse in the piriformis.  Pulse motor and sensation intact distally.  Negative straight leg raise test.  Reflexes 2+ and equal.  Skin:    General: Skin is warm and dry.  Neurological:     Mental Status: She is alert and oriented to person, place, and time.  Psychiatric:        Behavior: Behavior normal.     ED Results / Procedures / Treatments   Labs (all labs ordered are listed, but only abnormal results are displayed) Labs Reviewed - No data to display  EKG None  Radiology CT L-SPINE NO CHARGE  Result Date: 04/06/2023 CLINICAL DATA:  Low back pain radiating to the right leg after a fall 1.5 weeks ago. EXAM: CT LUMBAR SPINE WITHOUT CONTRAST TECHNIQUE: Multidetector CT imaging of the lumbar spine was performed without intravenous contrast administration. Multiplanar CT image reconstructions were also generated. RADIATION DOSE REDUCTION: This exam was performed according to the departmental  dose-optimization program which includes automated exposure control, adjustment of the mA and/or kV according to patient size and/or use of iterative reconstruction technique. COMPARISON:  Lumbar spine radiographs 04/02/2020, CT abdomen/pelvis 01/28/2022, lumbar spine CT 12/27/2021 FINDINGS: Segmentation: Standard; the lowest formed disc space is designated L5-S1. Alignment: Fixed grade 1 anterolisthesis of L4 on L5 is unchanged. Vertebrae: Vertebral body heights are preserved there is no evidence of acute fracture. There is no suspicious osseous lesion. Postsurgical changes reflecting posterior instrumented fusion and interbody spacer placement at L4-L5 are again seen with evidence of osseous  fusion across the disc spaces and left posterior elements. There is no perihardware lucency or other evidence of hardware related complication. Paraspinal and other soft tissues: The paraspinal soft tissues are unremarkable. The abdominal and pelvic viscera are assessed on the separately dictated CT abdomen/pelvis. Disc levels: Comparison for the below findings are made using the CT lumbar spine from 12/27/2021. T12-L1: There is small partially calcified disc protrusion without significant spinal canal or neural foraminal stenosis, unchanged. L1-L2: No significant spinal canal or neural foraminal stenosis. L2-L3: No significant spinal canal or neural foraminal stenosis. L3-L4: There is disc space narrowing with a diffuse disc bulge, endplate spurring, and mild bilateral facet arthropathy with ligamentum flavum thickening resulting in probable mild spinal canal stenosis (suboptimally evaluated due to streak artifact), and mild right worse than left neural foraminal stenosis. Findings are not significantly changed. L4-L5: Status post posterior instrumented fusion and right laminectomy with grade 1 anterolisthesis and endplate spurring resulting in moderate right and mild left neural foraminal stenosis without significant spinal  canal stenosis, unchanged. The interbody spacer comes in close proximity to the exiting right L4 nerve root. L5-S1: There is moderate disc space narrowing, degenerative endplate change, and mild left worse than right facet arthropathy resulting in mild bilateral neural foraminal stenosis without significant spinal canal stenosis, unchanged. IMPRESSION: 1. No acute fracture or traumatic malalignment of the lumbar spine. 2. Status post posterior instrumented fusion and interbody spacer placement at L4-L5. The interbody spacer comes in close proximity to the exiting right L4 nerve root, unchanged. Correlate with any radicular symptoms. 3. Adjacent segment disease at L3-L4 and L5-S1 with probable mild spinal canal stenosis at L3-L4, and mild neural foraminal stenosis at both levels. Electronically Signed   By: Lesia Hausen M.D.   On: 04/06/2023 13:46   CT Renal Stone Study  Result Date: 04/06/2023 CLINICAL DATA:  Low back pain after fall 2 weeks ago. EXAM: CT ABDOMEN AND PELVIS WITHOUT CONTRAST TECHNIQUE: Multidetector CT imaging of the abdomen and pelvis was performed following the standard protocol without IV contrast. RADIATION DOSE REDUCTION: This exam was performed according to the departmental dose-optimization program which includes automated exposure control, adjustment of the mA and/or kV according to patient size and/or use of iterative reconstruction technique. COMPARISON:  January 25, 2023. FINDINGS: Lower chest: No acute abnormality. Hepatobiliary: No focal liver abnormality is seen. Status post cholecystectomy. No biliary dilatation. Pancreas: Unremarkable. No pancreatic ductal dilatation or surrounding inflammatory changes. Spleen: Normal in size without focal abnormality. Adrenals/Urinary Tract: Stable 1.7 cm left adrenal nodule most consistent with adenoma. Right adrenal gland is unremarkable. Stable left renal cysts are noted force no further follow-up is required. No hydronephrosis or renal  obstruction is noted. Urinary bladder is decompressed. Stomach/Bowel: The stomach is unremarkable. There is no evidence of bowel obstruction or inflammation. Stool is noted throughout the colon. The appendix is not clearly visualized currently. Vascular/Lymphatic: Aortic atherosclerosis. No enlarged abdominal or pelvic lymph nodes. Reproductive: Status post hysterectomy. No adnexal masses. Other: Postsurgical changes are noted consistent with prior ventral hernia repair. No ascites is noted. Musculoskeletal: Severe osteoarthritis is seen involving right hip. Status post surgical posterior fusion of L4-5 with bilateral intrapedicular screw placement. No acute osseous abnormality is noted. IMPRESSION: No acute abnormality seen in the abdomen or pelvis. Aortic Atherosclerosis (ICD10-I70.0). Electronically Signed   By: Lupita Raider M.D.   On: 04/06/2023 13:45    Procedures Procedures    Medications Ordered in ED Medications  oxyCODONE (Oxy IR/ROXICODONE) immediate release tablet  5 mg (has no administration in time range)  acetaminophen (TYLENOL) tablet 1,000 mg (1,000 mg Oral Given 04/06/23 1110)  ketorolac (TORADOL) 15 MG/ML injection 15 mg (15 mg Intramuscular Given 04/06/23 1111)  oxyCODONE (Oxy IR/ROXICODONE) immediate release tablet 5 mg (5 mg Oral Given 04/06/23 1111)    ED Course/ Medical Decision Making/ A&P                                 Medical Decision Making Amount and/or Complexity of Data Reviewed Radiology: ordered.  Risk OTC drugs. Prescription drug management.   67 yo F with a cc of right side low back pain that radiates down the leg.  She had a x-ray done recently that showed new vascular necrosis.  Not sure if that explains her symptoms.  Tends more like she has radiculopathy.  Will obtain CT imaging to assess for occult spinal fracture.  Treat pain here.  Reassess.  CT without acute intraabdominal pathology.  No intraspinal pathology.    1:57 PM:  I have discussed the  diagnosis/risks/treatment options with the patient.  Evaluation and diagnostic testing in the emergency department does not suggest an emergent condition requiring admission or immediate intervention beyond what has been performed at this time.  They will follow up with PCP, ortho. We also discussed returning to the ED immediately if new or worsening sx occur. We discussed the sx which are most concerning (e.g., sudden worsening pain, fever, inability to tolerate by mouth, cauda equina s/sx) that necessitate immediate return. Medications administered to the patient during their visit and any new prescriptions provided to the patient are listed below.  Medications given during this visit Medications  oxyCODONE (Oxy IR/ROXICODONE) immediate release tablet 5 mg (has no administration in time range)  acetaminophen (TYLENOL) tablet 1,000 mg (1,000 mg Oral Given 04/06/23 1110)  ketorolac (TORADOL) 15 MG/ML injection 15 mg (15 mg Intramuscular Given 04/06/23 1111)  oxyCODONE (Oxy IR/ROXICODONE) immediate release tablet 5 mg (5 mg Oral Given 04/06/23 1111)     The patient appears reasonably screen and/or stabilized for discharge and I doubt any other medical condition or other [facility] requiring further screening, evaluation, or treatment in the ED at this time prior to discharge.          Final Clinical Impression(s) / ED Diagnoses Final diagnoses:  Acute right-sided low back pain with right-sided sciatica    Rx / DC Orders ED Discharge Orders          Ordered    diclofenac Sodium (VOLTAREN) 1 % GEL  4 times daily        04/06/23 1355              Melene Plan, DO 04/06/23 1357

## 2023-04-06 NOTE — Discharge Instructions (Addendum)
Your CT scan did not show any obvious acute injury.  As we discussed earlier your family doctor should be the provider that is able to help you out with pain medicine to bridge the gap between now and when you are able to see a specialist in the office.  Your back pain is most likely due to a muscular strain.  There is been a lot of research on back pain, unfortunately the only thing that seems to really help is Tylenol and ibuprofen.  Relative rest is also important to not lift greater than 10 pounds bending or twisting at the waist.  Please follow-up with your family physician.  The other thing that really seems to benefit patients is physical therapy which your doctor may send you for.  Please return to the emergency department for new numbness or weakness to your arms or legs. Difficulty with urinating or urinating or pooping on yourself.  Also if you cannot feel toilet paper when you wipe or get a fever.   Use the gel as prescribed Also take tylenol 1000mg (2 extra strength) four times a day.

## 2023-04-07 ENCOUNTER — Encounter (HOSPITAL_COMMUNITY): Payer: Self-pay

## 2023-04-15 ENCOUNTER — Ambulatory Visit (HOSPITAL_COMMUNITY): Payer: Medicare HMO | Attending: Physician Assistant

## 2023-04-20 ENCOUNTER — Ambulatory Visit: Payer: Medicare HMO

## 2023-04-22 ENCOUNTER — Ambulatory Visit: Payer: Medicare HMO | Admitting: Orthopaedic Surgery

## 2023-05-11 ENCOUNTER — Ambulatory Visit: Payer: Medicare HMO | Admitting: Orthopaedic Surgery

## 2023-05-15 ENCOUNTER — Emergency Department (HOSPITAL_COMMUNITY): Payer: Medicare HMO

## 2023-05-15 ENCOUNTER — Other Ambulatory Visit: Payer: Self-pay

## 2023-05-15 ENCOUNTER — Encounter (HOSPITAL_COMMUNITY): Payer: Self-pay

## 2023-05-15 ENCOUNTER — Emergency Department (HOSPITAL_COMMUNITY)
Admission: EM | Admit: 2023-05-15 | Discharge: 2023-05-16 | Disposition: A | Discharge status: Home / Self Care | Payer: Medicare HMO | Attending: Emergency Medicine | Admitting: Emergency Medicine

## 2023-05-15 DIAGNOSIS — R0602 Shortness of breath: Secondary | ICD-10-CM | POA: Insufficient documentation

## 2023-05-15 DIAGNOSIS — Z96649 Presence of unspecified artificial hip joint: Secondary | ICD-10-CM | POA: Insufficient documentation

## 2023-05-15 DIAGNOSIS — M25551 Pain in right hip: Secondary | ICD-10-CM | POA: Diagnosis not present

## 2023-05-15 MED ORDER — BUPRENORPHINE HCL 8 MG SL SUBL
8.0000 mg | SUBLINGUAL_TABLET | Freq: Once | SUBLINGUAL | Status: DC
  Filled 2023-05-15 (×2): qty 1

## 2023-05-15 NOTE — ED Provider Notes (Signed)
Sherry [name] Provider Note   CSN: [id number] Arrival date & time: 05/15/23  2339     History {Add pertinent medical, surgical, social history, OB history to HPI:1} No chief complaint on file.   Sherry [name] is a 67 y.o. female.  67 yo F with a chief complaints of difficulty breathing.  She thinks that she did not get enough fluid taken off of her when she was at dialysis yesterday.  She laid back to go to sleep and felt she was having trouble breathing and so called EMS.  Denies cough congestion or fever.  She is also complaining of right hip pain.  Tells me that she needs a hip replacement.  Is scheduled to see an orthopedist on the 14th of this month.  Denies new injury to the area.        Home Medications Prior to Admission medications   Medication Sig Start Date End Date Taking? Authorizing Provider  acetaminophen (TYLENOL) 325 MG tablet Take 2 tablets (650 mg total) by mouth every 6 (six) hours as needed. 01/02/23   Sloan Leiter, DO  albuterol (VENTOLIN HFA) 108 (90 Base) MCG/ACT inhaler Inhale 2 puffs into the lungs every 4 (four) hours as needed for wheezing or shortness of breath. 04/23/21   [provider]  BELBUCA 600 MCG FILM Take 1 Film by mouth 2 (two) times daily. 01/12/22   [provider]  BELBUCA 750 MCG FILM SMARTSIG:1 Strip(s) By Mouth Twice Daily PRN 02/06/22   [provider]  diclofenac Sodium (VOLTAREN) 1 % GEL Apply 4 g topically 4 (four) times daily. 04/06/23   Melene Plan, DO  FLUoxetine (PROZAC) 40 MG capsule Take 40 mg by mouth in the morning. 11/09/18   [provider]  gabapentin (NEURONTIN) 400 MG capsule Take 400 mg by mouth 2 (two) times daily. 12/28/21   [provider]  hydrALAZINE (APRESOLINE) 50 MG tablet Take 50 mg by mouth 3 (three) times daily. 03/24/21   [provider]  INGREZZA 80 MG CAPS Take 80 mg by mouth at bedtime. 10/15/17   [provider]  isosorbide dinitrate (ISORDIL) 20 MG tablet Take 20 mg by mouth 3 (three) times daily. 12/30/21   [provider]  labetalol (NORMODYNE) 200 MG tablet Take 200 mg by mouth 2 (two) times daily. 12/26/21   [provider]  lidocaine (LIDODERM) 5 % Place 1 patch onto the skin daily. Remove & Discard patch within 12 hours or as directed by MD Patient taking differently: Place 1 patch onto the skin daily as needed (back pain). Remove & Discard patch within 12 hours or as directed by MD 12/27/21   Marita Kansas, PA-C  lidocaine-prilocaine (EMLA) cream SMARTSIG:sparingly Topical As Directed 10/28/21   [provider]  Melatonin 10 MG TABS Take 10 mg by mouth at bedtime as needed (sleep).    [provider]  nitroGLYCERIN (NITROSTAT) 0.4 MG SL tablet Place 0.4 mg under the tongue every 5 (five) minutes as needed for chest pain. 12/30/21   [provider]  oxyCODONE (ROXICODONE) 5 MG immediate release tablet Take 1 tablet (5 mg total) by mouth every 6 (six) hours as needed for severe pain. 01/02/23   Sloan Leiter, DO  pantoprazole (PROTONIX) 40 MG tablet Take 1 tablet (40 mg total) by mouth daily. 02/01/22 04/02/22  Meredeth Ide, MD  senna-docusate (SENOKOT-S) 8.6-50 MG tablet Take 1 tablet by mouth at bedtime as needed for  mild constipation. 03/02/23   Long, Arlyss Repress, MD  traMADol (ULTRAM) 50 MG tablet Take 50-100 mg by mouth 3 (three) times daily as needed for severe pain.    [provider]  zolpidem (AMBIEN) 5 MG tablet Take 5 mg by mouth at bedtime. 01/05/22   [provider]      Allergies    Abilify [aripiprazole], Remeron [mirtazapine], Trazodone and nefazodone, Flexeril [cyclobenzaprine], and Amoxicillin    Review of Systems   Review of Systems  Physical Exam Updated Vital Signs SpO2 100%  Physical Exam Vitals and nursing note reviewed.  Constitutional:      General: She is not in acute distress.    Appearance: She is  well-developed. She is not diaphoretic.  HENT:     Head: Normocephalic and atraumatic.  Eyes:     Pupils: Pupils are equal, round, and reactive to light.  Cardiovascular:     Rate and Rhythm: Normal rate and regular rhythm.     Heart sounds: No murmur heard.    No friction rub. No gallop.  Pulmonary:     Effort: Pulmonary effort is normal.     Breath sounds: No wheezing or rales.  Abdominal:     General: There is no distension.     Palpations: Abdomen is soft.     Tenderness: There is no abdominal tenderness.  Musculoskeletal:        General: No tenderness.     Cervical back: Normal range of motion and neck supple.     Comments: Able to range the right hip without significant discomfort.  Skin:    General: Skin is warm and dry.  Neurological:     Mental Status: She is alert and oriented to person, place, and time.  Psychiatric:        Behavior: Behavior normal.     ED Results / Procedures / Treatments   Labs (all labs ordered are listed, but only abnormal results are displayed) Labs Reviewed  CBC WITH DIFFERENTIAL/PLATELET  COMPREHENSIVE METABOLIC PANEL  BRAIN NATRIURETIC PEPTIDE  TROPONIN I (HIGH SENSITIVITY)    EKG None  Radiology No results found.  Procedures Procedures  {Document cardiac monitor, telemetry assessment procedure when appropriate:1}  Medications Ordered in ED Medications - No data to display  ED Course/ Medical Decision Making/ A&P   {   Click here for ABCD2, HEART and other calculatorsREFRESH Note before signing :1}                              Medical Decision Making Amount and/or Complexity of Data Reviewed Labs: ordered. Radiology: ordered.   67 yo F with a chief complaints of difficulty breathing and right hip pain.  Has had right hip pain for a long time.  It is on chronic pain medications.  She is complaining of difficulty breathing when lying flat.  ESRD on dialysis denies any recent missed sessions.    {Document critical  care time when appropriate:1} {Document review of labs and clinical decision tools ie heart score, Chads2Vasc2 etc:1}  {Document your independent review of radiology images, and any outside records:1} {Document your discussion with family members, caretakers, and with consultants:1} {Document social determinants of health affecting pt's care:1} {Document your decision making why or why not admission, treatments were needed:1} Final Clinical Impression(s) / ED Diagnoses Final diagnoses:  None    Rx / DC Orders ED Discharge Orders     None

## 2023-05-15 NOTE — ED Triage Notes (Signed)
PT coming from home with a c/o of SOB for the past few hrs. PT is a dialysis PT (M/W/F) and was last seen on Friday. PT is c/o of right sided hip pain with a level of 9 and has been told that she needs a hip replacement. EMS gave 0.4mg  of nitroglycerin before arrival.

## 2023-05-16 LAB — COMPREHENSIVE METABOLIC PANEL
ALT: 9 U/L (ref 0–44)
AST: 10 U/L — ABNORMAL LOW (ref 15–41)
Albumin: 3.1 g/dL — ABNORMAL LOW (ref 3.5–5.0)
Alkaline Phosphatase: 76 U/L (ref 38–126)
Anion gap: 11 (ref 5–15)
BUN: 42 mg/dL — ABNORMAL HIGH (ref 8–23)
CO2: 24 mmol/L (ref 22–32)
Calcium: 9.6 mg/dL (ref 8.9–10.3)
Chloride: 107 mmol/L (ref 98–111)
Creatinine, Ser: 4.8 mg/dL — ABNORMAL HIGH (ref 0.44–1.00)
GFR, Estimated: 9 mL/min — ABNORMAL LOW (ref >60)
Glucose, Bld: 82 mg/dL (ref 70–99)
Potassium: 4.7 mmol/L (ref 3.5–5.1)
Sodium: 142 mmol/L (ref 135–145)
Total Bilirubin: 0.5 mg/dL (ref 0.3–1.2)
Total Protein: 6 g/dL — ABNORMAL LOW (ref 6.5–8.1)

## 2023-05-16 LAB — CBC WITH DIFFERENTIAL/PLATELET
Abs Immature Granulocytes: 0.02 10*3/uL (ref 0.00–0.07)
Basophils Absolute: 0 10*3/uL (ref 0.0–0.1)
Basophils Relative: 1 %
Eosinophils Absolute: 0.2 10*3/uL (ref 0.0–0.5)
Eosinophils Relative: 3 %
HCT: 26 % — ABNORMAL LOW (ref 36.0–46.0)
Hemoglobin: 7.8 g/dL — ABNORMAL LOW (ref 12.0–15.0)
Immature Granulocytes: 0 %
Lymphocytes Relative: 27 %
Lymphs Abs: 1.8 10*3/uL (ref 0.7–4.0)
MCH: 27.6 pg (ref 26.0–34.0)
MCHC: 30 g/dL (ref 30.0–36.0)
MCV: 91.9 fL (ref 80.0–100.0)
Monocytes Absolute: 0.6 10*3/uL (ref 0.1–1.0)
Monocytes Relative: 8 %
Neutro Abs: 4 10*3/uL (ref 1.7–7.7)
Neutrophils Relative %: 61 %
Platelets: 161 10*3/uL (ref 150–400)
RBC: 2.83 MIL/uL — ABNORMAL LOW (ref 3.87–5.11)
RDW: 16.8 % — ABNORMAL HIGH (ref 11.5–15.5)
WBC: 6.6 10*3/uL (ref 4.0–10.5)
nRBC: 0 % (ref 0.0–0.2)

## 2023-05-16 LAB — BRAIN NATRIURETIC PEPTIDE: B Natriuretic Peptide: 457.2 pg/mL — ABNORMAL HIGH (ref 0.0–100.0)

## 2023-05-16 LAB — TROPONIN I (HIGH SENSITIVITY): Troponin I (High Sensitivity): 9 ng/L (ref <18)

## 2023-05-16 NOTE — Discharge Instructions (Signed)
Please call your dialysis center in the morning and see when they can get you back in for dialysis.  Please return to the emergency department for worsening difficulty breathing.  Please follow-up with your orthopedist in the office.

## 2023-05-21 ENCOUNTER — Emergency Department (HOSPITAL_COMMUNITY): Payer: Medicare HMO

## 2023-05-21 ENCOUNTER — Inpatient Hospital Stay (HOSPITAL_COMMUNITY)
Admission: EM | Admit: 2023-05-21 | Discharge: 2023-05-23 | DRG: 091 | Disposition: A | Discharge status: Home Health | Payer: Medicare HMO | Attending: Internal Medicine | Admitting: Internal Medicine

## 2023-05-21 ENCOUNTER — Other Ambulatory Visit: Payer: Self-pay

## 2023-05-21 ENCOUNTER — Encounter (HOSPITAL_COMMUNITY): Payer: Self-pay | Admitting: *Deleted

## 2023-05-21 DIAGNOSIS — F1911 Other psychoactive substance abuse, in remission: Secondary | ICD-10-CM | POA: Insufficient documentation

## 2023-05-21 DIAGNOSIS — F1120 Opioid dependence, uncomplicated: Secondary | ICD-10-CM | POA: Diagnosis present

## 2023-05-21 DIAGNOSIS — Z803 Family history of malignant neoplasm of breast: Secondary | ICD-10-CM

## 2023-05-21 DIAGNOSIS — F419 Anxiety disorder, unspecified: Secondary | ICD-10-CM | POA: Diagnosis present

## 2023-05-21 DIAGNOSIS — G8929 Other chronic pain: Secondary | ICD-10-CM | POA: Diagnosis present

## 2023-05-21 DIAGNOSIS — Z880 Allergy status to penicillin: Secondary | ICD-10-CM

## 2023-05-21 DIAGNOSIS — Z9049 Acquired absence of other specified parts of digestive tract: Secondary | ICD-10-CM

## 2023-05-21 DIAGNOSIS — T40425A Adverse effect of tramadol, initial encounter: Secondary | ICD-10-CM | POA: Diagnosis present

## 2023-05-21 DIAGNOSIS — Z79899 Other long term (current) drug therapy: Secondary | ICD-10-CM

## 2023-05-21 DIAGNOSIS — E876 Hypokalemia: Secondary | ICD-10-CM | POA: Diagnosis present

## 2023-05-21 DIAGNOSIS — F418 Other specified anxiety disorders: Secondary | ICD-10-CM | POA: Diagnosis present

## 2023-05-21 DIAGNOSIS — Z888 Allergy status to other drugs, medicaments and biological substances status: Secondary | ICD-10-CM

## 2023-05-21 DIAGNOSIS — F319 Bipolar disorder, unspecified: Secondary | ICD-10-CM | POA: Diagnosis present

## 2023-05-21 DIAGNOSIS — Z8249 Family history of ischemic heart disease and other diseases of the circulatory system: Secondary | ICD-10-CM

## 2023-05-21 DIAGNOSIS — Z90710 Acquired absence of both cervix and uterus: Secondary | ICD-10-CM

## 2023-05-21 DIAGNOSIS — N2581 Secondary hyperparathyroidism of renal origin: Secondary | ICD-10-CM | POA: Diagnosis present

## 2023-05-21 DIAGNOSIS — D631 Anemia in chronic kidney disease: Secondary | ICD-10-CM | POA: Diagnosis present

## 2023-05-21 DIAGNOSIS — G9340 Encephalopathy, unspecified: Secondary | ICD-10-CM | POA: Diagnosis present

## 2023-05-21 DIAGNOSIS — F1320 Sedative, hypnotic or anxiolytic dependence, uncomplicated: Secondary | ICD-10-CM | POA: Diagnosis present

## 2023-05-21 DIAGNOSIS — E8770 Fluid overload, unspecified: Secondary | ICD-10-CM | POA: Diagnosis present

## 2023-05-21 DIAGNOSIS — G928 Other toxic encephalopathy: Secondary | ICD-10-CM | POA: Diagnosis not present

## 2023-05-21 DIAGNOSIS — Z8673 Personal history of transient ischemic attack (TIA), and cerebral infarction without residual deficits: Secondary | ICD-10-CM

## 2023-05-21 DIAGNOSIS — Z8619 Personal history of other infectious and parasitic diseases: Secondary | ICD-10-CM

## 2023-05-21 DIAGNOSIS — Z91158 Patient's noncompliance with renal dialysis for other reason: Secondary | ICD-10-CM

## 2023-05-21 DIAGNOSIS — G2581 Restless legs syndrome: Secondary | ICD-10-CM | POA: Diagnosis present

## 2023-05-21 DIAGNOSIS — Z9079 Acquired absence of other genital organ(s): Secondary | ICD-10-CM

## 2023-05-21 DIAGNOSIS — T426X5A Adverse effect of other antiepileptic and sedative-hypnotic drugs, initial encounter: Secondary | ICD-10-CM | POA: Diagnosis present

## 2023-05-21 DIAGNOSIS — Z90722 Acquired absence of ovaries, bilateral: Secondary | ICD-10-CM

## 2023-05-21 DIAGNOSIS — N186 End stage renal disease: Secondary | ICD-10-CM | POA: Diagnosis present

## 2023-05-21 DIAGNOSIS — Z885 Allergy status to narcotic agent status: Secondary | ICD-10-CM

## 2023-05-21 DIAGNOSIS — I132 Hypertensive heart and chronic kidney disease with heart failure and with stage 5 chronic kidney disease, or end stage renal disease: Secondary | ICD-10-CM | POA: Diagnosis present

## 2023-05-21 DIAGNOSIS — Z992 Dependence on renal dialysis: Secondary | ICD-10-CM

## 2023-05-21 DIAGNOSIS — F17210 Nicotine dependence, cigarettes, uncomplicated: Secondary | ICD-10-CM | POA: Diagnosis present

## 2023-05-21 DIAGNOSIS — Z853 Personal history of malignant neoplasm of breast: Secondary | ICD-10-CM

## 2023-05-21 DIAGNOSIS — Z8614 Personal history of Methicillin resistant Staphylococcus aureus infection: Secondary | ICD-10-CM

## 2023-05-21 DIAGNOSIS — T424X5A Adverse effect of benzodiazepines, initial encounter: Secondary | ICD-10-CM | POA: Diagnosis present

## 2023-05-21 DIAGNOSIS — Y92009 Unspecified place in unspecified non-institutional (private) residence as the place of occurrence of the external cause: Secondary | ICD-10-CM

## 2023-05-21 DIAGNOSIS — I5032 Chronic diastolic (congestive) heart failure: Secondary | ICD-10-CM | POA: Diagnosis present

## 2023-05-21 DIAGNOSIS — R4182 Altered mental status, unspecified: Principal | ICD-10-CM

## 2023-05-21 LAB — COMPREHENSIVE METABOLIC PANEL
ALT: 10 U/L (ref 0–44)
ALT: 11 U/L (ref 0–44)
AST: 18 U/L (ref 15–41)
AST: 12 U/L — ABNORMAL LOW (ref 15–41)
Albumin: 3.4 g/dL — ABNORMAL LOW (ref 3.5–5.0)
Albumin: 3 g/dL — ABNORMAL LOW (ref 3.5–5.0)
Alkaline Phosphatase: 65 U/L (ref 38–126)
Alkaline Phosphatase: 61 U/L (ref 38–126)
Anion gap: 14 (ref 5–15)
Anion gap: 11 (ref 5–15)
BUN: 59 mg/dL — ABNORMAL HIGH (ref 8–23)
BUN: 57 mg/dL — ABNORMAL HIGH (ref 8–23)
CO2: 25 mmol/L (ref 22–32)
CO2: 25 mmol/L (ref 22–32)
Calcium: 9.4 mg/dL (ref 8.9–10.3)
Calcium: 9.2 mg/dL (ref 8.9–10.3)
Chloride: 104 mmol/L (ref 98–111)
Chloride: 106 mmol/L (ref 98–111)
Creatinine, Ser: 5.46 mg/dL — ABNORMAL HIGH (ref 0.44–1.00)
Creatinine, Ser: 5.5 mg/dL — ABNORMAL HIGH (ref 0.44–1.00)
GFR, Estimated: 8 mL/min — ABNORMAL LOW (ref >60)
GFR, Estimated: 8 mL/min — ABNORMAL LOW (ref >60)
Glucose, Bld: 82 mg/dL (ref 70–99)
Glucose, Bld: 81 mg/dL (ref 70–99)
Potassium: 5.8 mmol/L — ABNORMAL HIGH (ref 3.5–5.1)
Potassium: 5.6 mmol/L — ABNORMAL HIGH (ref 3.5–5.1)
Sodium: 143 mmol/L (ref 135–145)
Sodium: 142 mmol/L (ref 135–145)
Total Bilirubin: 0.8 mg/dL (ref <1.2)
Total Bilirubin: 1 mg/dL (ref <1.2)
Total Protein: 6.9 g/dL (ref 6.5–8.1)
Total Protein: 6.1 g/dL — ABNORMAL LOW (ref 6.5–8.1)

## 2023-05-21 LAB — CBC WITH DIFFERENTIAL/PLATELET
Abs Immature Granulocytes: 0.02 10*3/uL (ref 0.00–0.07)
Basophils Absolute: 0 10*3/uL (ref 0.0–0.1)
Basophils Relative: 0 %
Eosinophils Absolute: 0.2 10*3/uL (ref 0.0–0.5)
Eosinophils Relative: 2 %
HCT: 29.5 % — ABNORMAL LOW (ref 36.0–46.0)
Hemoglobin: 8.6 g/dL — ABNORMAL LOW (ref 12.0–15.0)
Immature Granulocytes: 0 %
Lymphocytes Relative: 15 %
Lymphs Abs: 0.9 10*3/uL (ref 0.7–4.0)
MCH: 27.9 pg (ref 26.0–34.0)
MCHC: 29.2 g/dL — ABNORMAL LOW (ref 30.0–36.0)
MCV: 95.8 fL (ref 80.0–100.0)
Monocytes Absolute: 0.4 10*3/uL (ref 0.1–1.0)
Monocytes Relative: 7 %
Neutro Abs: 4.7 10*3/uL (ref 1.7–7.7)
Neutrophils Relative %: 76 %
Platelets: 217 10*3/uL (ref 150–400)
RBC: 3.08 MIL/uL — ABNORMAL LOW (ref 3.87–5.11)
RDW: 17.1 % — ABNORMAL HIGH (ref 11.5–15.5)
WBC: 6.3 10*3/uL (ref 4.0–10.5)
nRBC: 0 % (ref 0.0–0.2)

## 2023-05-21 LAB — I-STAT VENOUS BLOOD GAS, ED
Acid-Base Excess: 3 mmol/L — ABNORMAL HIGH (ref 0.0–2.0)
Bicarbonate: 24 mmol/L (ref 20.0–28.0)
Calcium, Ion: 0.86 mmol/L — CL (ref 1.15–1.40)
HCT: 22 % — ABNORMAL LOW (ref 36.0–46.0)
Hemoglobin: 7.5 g/dL — ABNORMAL LOW (ref 12.0–15.0)
O2 Saturation: 100 %
Potassium: 6 mmol/L — ABNORMAL HIGH (ref 3.5–5.1)
Sodium: 136 mmol/L (ref 135–145)
TCO2: 25 mmol/L (ref 22–32)
pCO2, Ven: 21.8 mm[Hg] — ABNORMAL LOW (ref 44–60)
pH, Ven: 7.649 (ref 7.25–7.43)
pO2, Ven: 158 mm[Hg] — ABNORMAL HIGH (ref 32–45)

## 2023-05-21 LAB — CBC
HCT: 25.6 % — ABNORMAL LOW (ref 36.0–46.0)
Hemoglobin: 7.5 g/dL — ABNORMAL LOW (ref 12.0–15.0)
MCH: 27.7 pg (ref 26.0–34.0)
MCHC: 29.3 g/dL — ABNORMAL LOW (ref 30.0–36.0)
MCV: 94.5 fL (ref 80.0–100.0)
Platelets: 188 10*3/uL (ref 150–400)
RBC: 2.71 MIL/uL — ABNORMAL LOW (ref 3.87–5.11)
RDW: 16.9 % — ABNORMAL HIGH (ref 11.5–15.5)
WBC: 5.5 10*3/uL (ref 4.0–10.5)
nRBC: 0 % (ref 0.0–0.2)

## 2023-05-21 LAB — RENAL FUNCTION PANEL
Albumin: 2.9 g/dL — ABNORMAL LOW (ref 3.5–5.0)
Anion gap: 11 (ref 5–15)
BUN: 57 mg/dL — ABNORMAL HIGH (ref 8–23)
CO2: 24 mmol/L (ref 22–32)
Calcium: 8.9 mg/dL (ref 8.9–10.3)
Chloride: 107 mmol/L (ref 98–111)
Creatinine, Ser: 5.65 mg/dL — ABNORMAL HIGH (ref 0.44–1.00)
GFR, Estimated: 8 mL/min — ABNORMAL LOW (ref >60)
Glucose, Bld: 68 mg/dL — ABNORMAL LOW (ref 70–99)
Phosphorus: 6.8 mg/dL — ABNORMAL HIGH (ref 2.5–4.6)
Potassium: 4.9 mmol/L (ref 3.5–5.1)
Sodium: 142 mmol/L (ref 135–145)

## 2023-05-21 LAB — RAPID URINE DRUG SCREEN, HOSP PERFORMED
Amphetamines: NOT DETECTED
Barbiturates: NOT DETECTED
Benzodiazepines: POSITIVE — AB
Cocaine: NOT DETECTED
Opiates: NOT DETECTED
Tetrahydrocannabinol: NOT DETECTED

## 2023-05-21 LAB — I-STAT CHEM 8, ED
BUN: 68 mg/dL — ABNORMAL HIGH (ref 8–23)
Calcium, Ion: 0.85 mmol/L — CL (ref 1.15–1.40)
Chloride: 111 mmol/L (ref 98–111)
Creatinine, Ser: 5.8 mg/dL — ABNORMAL HIGH (ref 0.44–1.00)
Glucose, Bld: 87 mg/dL (ref 70–99)
HCT: 24 % — ABNORMAL LOW (ref 36.0–46.0)
Hemoglobin: 8.2 g/dL — ABNORMAL LOW (ref 12.0–15.0)
Potassium: 6 mmol/L — ABNORMAL HIGH (ref 3.5–5.1)
Sodium: 137 mmol/L (ref 135–145)
TCO2: 26 mmol/L (ref 22–32)

## 2023-05-21 LAB — PHOSPHORUS: Phosphorus: 7 mg/dL — ABNORMAL HIGH (ref 2.5–4.6)

## 2023-05-21 LAB — TROPONIN I (HIGH SENSITIVITY)
Troponin I (High Sensitivity): 8 ng/L (ref <18)
Troponin I (High Sensitivity): 6 ng/L (ref <18)

## 2023-05-21 LAB — HIV ANTIBODY (ROUTINE TESTING W REFLEX): HIV Screen 4th Generation wRfx: NONREACTIVE

## 2023-05-21 LAB — HEPATITIS B SURFACE ANTIGEN: Hepatitis B Surface Ag: NONREACTIVE

## 2023-05-21 LAB — AMMONIA: Ammonia: 45 umol/L — ABNORMAL HIGH (ref 9–35)

## 2023-05-21 LAB — MAGNESIUM: Magnesium: 2.3 mg/dL (ref 1.7–2.4)

## 2023-05-21 LAB — TSH: TSH: 0.833 u[IU]/mL (ref 0.350–4.500)

## 2023-05-21 LAB — ETHANOL: Alcohol, Ethyl (B): <10 mg/dL (ref <10)

## 2023-05-21 LAB — MRSA NEXT GEN BY PCR, NASAL: MRSA by PCR Next Gen: NOT DETECTED

## 2023-05-21 MED ORDER — ACETAMINOPHEN 650 MG RE SUPP: 650.0000 mg | Freq: Four times a day (QID) | RECTAL | Status: DC | PRN

## 2023-05-21 MED ORDER — SENNOSIDES-DOCUSATE SODIUM 8.6-50 MG PO TABS: 1.0000 | ORAL_TABLET | Freq: Every evening | ORAL | Status: DC | PRN

## 2023-05-21 MED ORDER — LIDOCAINE HCL (PF) 1 % IJ SOLN: 5.0000 mL | INTRAMUSCULAR | Status: DC | PRN

## 2023-05-21 MED ORDER — ANTICOAGULANT SODIUM CITRATE 4% (200MG/5ML) IV SOLN: 5.0000 mL | Status: DC | PRN

## 2023-05-21 MED ORDER — LIDOCAINE-PRILOCAINE 2.5-2.5 % EX CREA: 1.0000 | TOPICAL_CREAM | CUTANEOUS | Status: DC | PRN

## 2023-05-21 MED ORDER — NICOTINE 7 MG/24HR TD PT24
7.0000 mg | MEDICATED_PATCH | Freq: Every day | TRANSDERMAL | Status: DC
Start: 2023-05-21 — End: 2023-05-23
  Administered 2023-05-21 – 2023-05-23 (×3): 7 mg via TRANSDERMAL
  Filled 2023-05-21 (×3): qty 1

## 2023-05-21 MED ORDER — PENTAFLUOROPROP-TETRAFLUOROETH EX AERO: 1.0000 | INHALATION_SPRAY | CUTANEOUS | Status: DC | PRN

## 2023-05-21 MED ORDER — HEPARIN SODIUM (PORCINE) 1000 UNIT/ML DIALYSIS
1000.0000 [IU] | INTRAMUSCULAR | Status: DC | PRN
  Administered 2023-05-22: 3200 [IU]
  Filled 2023-05-21 (×4): qty 1

## 2023-05-21 MED ORDER — ALTEPLASE 2 MG IJ SOLR: 2.0000 mg | Freq: Once | INTRAMUSCULAR | Status: DC | PRN

## 2023-05-21 MED ORDER — CHLORHEXIDINE GLUCONATE CLOTH 2 % EX PADS
6.0000 | MEDICATED_PAD | Freq: Every day | CUTANEOUS | Status: DC
  Administered 2023-05-21 – 2023-05-23 (×3): 6 via TOPICAL

## 2023-05-21 MED ORDER — ACETAMINOPHEN 325 MG PO TABS
650.0000 mg | ORAL_TABLET | Freq: Four times a day (QID) | ORAL | Status: DC | PRN
  Administered 2023-05-22 (×2): 650 mg via ORAL
  Filled 2023-05-21 (×2): qty 2

## 2023-05-21 MED ORDER — HEPARIN SODIUM (PORCINE) 1000 UNIT/ML DIALYSIS: 3500.0000 [IU] | INTRAMUSCULAR | Status: DC | PRN

## 2023-05-21 MED ORDER — HEPARIN SODIUM (PORCINE) 5000 UNIT/ML IJ SOLN
5000.0000 [IU] | Freq: Three times a day (TID) | INTRAMUSCULAR | Status: DC
  Administered 2023-05-21 – 2023-05-23 (×5): 5000 [IU] via SUBCUTANEOUS
  Filled 2023-05-21 (×6): qty 1

## 2023-05-21 MED ORDER — SODIUM CHLORIDE 0.9% FLUSH
3.0000 mL | Freq: Two times a day (BID) | INTRAVENOUS | Status: DC
  Administered 2023-05-21 – 2023-05-22 (×3): 3 mL via INTRAVENOUS

## 2023-05-21 NOTE — ED Notes (Signed)
Pt returns from radiology. 

## 2023-05-21 NOTE — Progress Notes (Signed)
PT Cancellation Note  Patient Details Name: Sherry [name] MRN: [id number] DOB: 1955-11-15   Cancelled Treatment:    Reason Eval/Treat Not Completed: Patient's level of consciousness. Per paramedic the pt is requiring sternal rub to arouse at times. PT will hold until the pt is more alert and better able to participate.   Arlyss Gandy 05/21/2023, 4:38 PM

## 2023-05-21 NOTE — Consult Note (Signed)
[facility] Renal Consultation Note    Indication for Consultation:  Management of ESRD/hemodialysis; anemia, hypertension/volume and secondary hyperparathyroidism  PCP:Pcp, No  HPI: Sherry [name] is a 67 y.o. female with ESRD on HD MWF at [facility].  Past medical history significant for HTN, anxiety, bipolar disorder and hx substance abuse.  Patient presented to the ED via EMS due to altered mental status.  Per chart review family said she was acting off but ED docs have not been able to get in touch with them for further explanation.  Poor historian due to current mental status.  Wakes briefly after repeat verbal stimuli.  Asks "Why am I at the hospital, how did I get here?"  Denies knowledge of today or last night, then returns to sleep.  Unable to say what she last remembers or when her last dialysis was.  Denies pain, CP, SOB and n/v/d.    Per chart review review last dialysis completed 05/19/23.  Shorted treatment by and left 11kg over her dry weight.  Recently huge gains and recently has been signing off early.  Last at her dry weight on 05/07/23.    Pertinent findings in the ED include hypertension, K 5.8 (that was hemolyzed), CXR with no acute abnormalities, xray pelvis with no acute abnormalities, CT head/spine with no acute abnormalities.  Patient being admitted for further evaluation and management.   Past Medical History:  Diagnosis Date   Alcohol use disorder, severe, dependence (HCC) 08/27/2017   Anxiety    Body mass index (BMI) 40.0-44.9, adult (HCC) 07/19/2020   Breast cancer (HCC)    right lumpectemy and lymph node    Cervical spondylosis 05/24/2020   Chronic bilateral low back pain with right-sided sciatica 05/24/2020   Chronic diastolic CHF (congestive heart failure) (HCC) 05/09/2019   Cigarette smoker 12/31/2017   ESRD (end stage renal disease) on dialysis (HCC) 08/22/2021   M-W-F   Essential hypertension 07/06/2017   GERD (gastroesophageal reflux  disease)    Hepatitis C virus infection cured after antiviral drug therapy 12/17/2012   Telephone Encounter - Theophilus Kinds, RN - 12/28/2016 9:54 AM EDT Patient called for HCV RNA test results. EOT 08-26-16 - not detected. 12 week post treatment 12-11-16 - not detected. Informed patient that she was cured. Patient verbalized understanding.          Hx of bipolar disorder 01/31/2016   Impaired functional mobility, balance, gait, and endurance 01/09/2021   MRSA infection    of breast incision   Neuroleptic-induced tardive dyskinesia 09/06/2017   Abilify   Opioid use disorder, severe, dependence (HCC) 08/27/2017   Restless legs syndrome 02/13/2013   Formatting of this note might be different from the original. IMPRESSION: Possible. Will follow.   Subacute dyskinesia due to drug 02/13/2013   Formatting of this note might be different from the original. STORY: Tardive dyskinesia and mild limb dyskinesia, LE>UE which likely due to prolonged antipsychotic used, abilify. Couldn't tolerate artane, gabapentin, clonazepam and xenazine.`E1o3L`IMPRESSION: Well tolerated depakote 250mg  bid, mood aspect is better as well. Will increase to 500mg  bid. ADR was discussed.  RTC 4-6 weeks.   TIA (transient ischemic attack) 2020   P/w BP >230/120 and neurologic symptoms, presumed TIA   Past Surgical History:  Procedure Laterality Date   A/V FISTULAGRAM Right 01/29/2022   Procedure: A/V Fistulagram;  Surgeon: Cephus Shelling, MD;  Location: [facility] INVASIVE CV LAB;  Service: Cardiovascular;  Laterality: Right;   AV FISTULA PLACEMENT Right 05/01/2021   Procedure:  RIGHT ARTERIOVENOUS (AV) FISTULA CREATION;  Surgeon: Victorino Sparrow, MD;  Location: [facility] OR;  Service: Vascular;  Laterality: Right;  PERIPHERAL NERVE BLOCK   BACK SURGERY  1990   BASCILIC VEIN TRANSPOSITION Right 09/02/2021   Procedure: RIGHT SECOND STAGE BASILIC VEIN TRANSPOSITION;  Surgeon: Victorino Sparrow, MD;  Location: [facility] OR;  Service: Vascular;   Laterality: Right;  PERIPHERAL NERVE BLOCK   BREAST SURGERY Right 2010   "breast cancer survivor" - states partial mastectomy and nodes   COLONOSCOPY     [facility]   ESOPHAGOGASTRODUODENOSCOPY     [facility]   ESOPHAGOGASTRODUODENOSCOPY  04/18/2020   [place] [facility]   LAPAROSCOPIC CHOLECYSTECTOMY  2002   LAPAROSCOPIC INCISIONAL / UMBILICAL / VENTRAL HERNIA REPAIR  04/13/2018   with BARD 15x 20cm mesh (supraumbilical)   LAPAROSCOPIC LYSIS OF ADHESIONS  07/12/2018   Procedure: LAPAROSCOPIC LYSIS OF ADHESIONS;  Surgeon: Shonna Chock, MD;  Location: [facility] ORS;  Service: Gynecology;;   MULTIPLE TOOTH EXTRACTIONS     MYOMECTOMY     x 2 prior to hysterectomy   PERIPHERAL VASCULAR BALLOON ANGIOPLASTY Right 01/29/2022   Procedure: PERIPHERAL VASCULAR BALLOON ANGIOPLASTY;  Surgeon: Cephus Shelling, MD;  Location: [facility] INVASIVE CV LAB;  Service: Cardiovascular;  Laterality: Right;  arm fistula   ROBOTIC ASSISTED BILATERAL SALPINGO OOPHERECTOMY Right 07/12/2018   Procedure: XI ROBOTIC ASSISTED RIGHT SALPINGO OOPHORECTOMY;  Surgeon: Shonna Chock, MD;  Location: [facility] ORS;  Service: Gynecology;  Laterality: Right;   TOTAL ABDOMINAL HYSTERECTOMY     fibroids    UPPER GASTROINTESTINAL ENDOSCOPY     WISDOM TOOTH EXTRACTION     Family History  Problem Relation Age of Onset   Heart attack Mother    Breast cancer Mother 30   Dementia Mother    Cancer Father 4       died of bleeding from kidneys   Colon cancer Neg Hx    Esophageal cancer Neg Hx    Stomach cancer Neg Hx    Rectal cancer Neg Hx    Social History:  reports that she has been smoking cigarettes. She has never been exposed to tobacco smoke. She has never used smokeless tobacco. She reports that she does not currently use alcohol. She reports that she does not currently use drugs. Allergies  Allergen Reactions   Abilify [Aripiprazole] Other (See Comments)    Tardive dyskinesia Oral   Remeron [Mirtazapine] Other  (See Comments)    Wgt stimulation /gain, Dizziness, Patient says "can tolerate"   Trazodone And Nefazodone Other (See Comments)    Nightmares/sleep diturbance   Flexeril [Cyclobenzaprine] Other (See Comments)    Pt states Flexeril makes her feel depressed    Amoxicillin Diarrhea and Other (See Comments)    NOTE the patient has had PCN WITHOUT reaction Has patient had a PCN reaction causing immediate rash, facial/tongue/throat swelling, SOB or lightheadedness with hypotension: No Has patient had a PCN reaction causing severe rash involving mucus membranes or skin necrosis: No Has patient had a PCN reaction that required hospitalization: No Has patient had a PCN reaction occurring within the last 10 years: No If all of the above answers are "NO", then may proceed with Cephalosporin use.    Prior to Admission medications   Medication Sig Start Date End Date Taking? Authorizing Provider  acetaminophen (TYLENOL) 325 MG tablet Take 2 tablets (650 mg total) by mouth every 6 (six) hours as needed. 01/02/23   Sloan Leiter, DO  albuterol (  VENTOLIN HFA) 108 (90 Base) MCG/ACT inhaler Inhale 2 puffs into the lungs every 4 (four) hours as needed for wheezing or shortness of breath. 04/23/21   [provider]  BELBUCA 600 MCG FILM Take 1 Film by mouth 2 (two) times daily. 01/12/22   [provider]  BELBUCA 750 MCG FILM SMARTSIG:1 Strip(s) By Mouth Twice Daily PRN 02/06/22   [provider]  diclofenac Sodium (VOLTAREN) 1 % GEL Apply 4 g topically 4 (four) times daily. 04/06/23   Melene Plan, DO  FLUoxetine (PROZAC) 40 MG capsule Take 40 mg by mouth in the morning. 11/09/18   [provider]  gabapentin (NEURONTIN) 400 MG capsule Take 400 mg by mouth 2 (two) times daily. 12/28/21   [provider]  hydrALAZINE (APRESOLINE) 50 MG tablet Take 50 mg by mouth 3 (three) times daily. 03/24/21   [provider]  INGREZZA 80 MG CAPS Take 80 mg by mouth at bedtime.  10/15/17   [provider]  isosorbide dinitrate (ISORDIL) 20 MG tablet Take 20 mg by mouth 3 (three) times daily. 12/30/21   [provider]  labetalol (NORMODYNE) 200 MG tablet Take 200 mg by mouth 2 (two) times daily. 12/26/21   [provider]  lidocaine (LIDODERM) 5 % Place 1 patch onto the skin daily. Remove & Discard patch within 12 hours or as directed by MD Patient taking differently: Place 1 patch onto the skin daily as needed (back pain). Remove & Discard patch within 12 hours or as directed by MD 12/27/21   Marita Kansas, PA-C  lidocaine-prilocaine (EMLA) cream SMARTSIG:sparingly Topical As Directed 10/28/21   [provider]  Melatonin 10 MG TABS Take 10 mg by mouth at bedtime as needed (sleep).    [provider]  nitroGLYCERIN (NITROSTAT) 0.4 MG SL tablet Place 0.4 mg under the tongue every 5 (five) minutes as needed for chest pain. 12/30/21   [provider]  oxyCODONE (ROXICODONE) 5 MG immediate release tablet Take 1 tablet (5 mg total) by mouth every 6 (six) hours as needed for severe pain. 01/02/23   Sloan Leiter, DO  pantoprazole (PROTONIX) 40 MG tablet Take 1 tablet (40 mg total) by mouth daily. 02/01/22 04/02/22  Meredeth Ide, MD  senna-docusate (SENOKOT-S) 8.6-50 MG tablet Take 1 tablet by mouth at bedtime as needed for mild constipation. 03/02/23   Long, Arlyss Repress, MD  traMADol (ULTRAM) 50 MG tablet Take 50-100 mg by mouth 3 (three) times daily as needed for severe pain.    [provider]  zolpidem (AMBIEN) 5 MG tablet Take 5 mg by mouth at bedtime. 01/05/22   [provider]   Current Facility-Administered Medications  Medication Dose Route Frequency Provider Last Rate Last Admin   acetaminophen (TYLENOL) tablet 650 mg  650 mg Oral Q6H PRN Morene Crocker, MD       Or   acetaminophen (TYLENOL) suppository 650 mg  650 mg Rectal Q6H PRN Morene Crocker, MD       [START ON 05/22/2023] Chlorhexidine  Gluconate Cloth 2 % PADS 6 each  6 each Topical Q0600 Lory Nowaczyk, PA       heparin injection 5,000 Units  5,000 Units Subcutaneous Q8H Gomez-Caraballo, Maria, MD       nicotine (NICODERM CQ - dosed in mg/24 hr) patch 7 mg  7 mg Transdermal Daily Morene Crocker, MD       senna-docusate (Senokot-S) tablet 1 tablet  1 tablet Oral QHS PRN Morene Crocker, MD  sodium chloride flush (NS) 0.9 % injection 3 mL  3 mL Intravenous Q12H Morene Crocker, MD       Current Outpatient Medications  Medication Sig Dispense Refill   acetaminophen (TYLENOL) 325 MG tablet Take 2 tablets (650 mg total) by mouth every 6 (six) hours as needed. 36 tablet 0   albuterol (VENTOLIN HFA) 108 (90 Base) MCG/ACT inhaler Inhale 2 puffs into the lungs every 4 (four) hours as needed for wheezing or shortness of breath.     BELBUCA 600 MCG FILM Take 1 Film by mouth 2 (two) times daily.     BELBUCA 750 MCG FILM SMARTSIG:1 Strip(s) By Mouth Twice Daily PRN     diclofenac Sodium (VOLTAREN) 1 % GEL Apply 4 g topically 4 (four) times daily. 100 g 0   FLUoxetine (PROZAC) 40 MG capsule Take 40 mg by mouth in the morning.     gabapentin (NEURONTIN) 400 MG capsule Take 400 mg by mouth 2 (two) times daily.     hydrALAZINE (APRESOLINE) 50 MG tablet Take 50 mg by mouth 3 (three) times daily.     INGREZZA 80 MG CAPS Take 80 mg by mouth at bedtime.     isosorbide dinitrate (ISORDIL) 20 MG tablet Take 20 mg by mouth 3 (three) times daily.     labetalol (NORMODYNE) 200 MG tablet Take 200 mg by mouth 2 (two) times daily.     lidocaine (LIDODERM) 5 % Place 1 patch onto the skin daily. Remove & Discard patch within 12 hours or as directed by MD (Patient taking differently: Place 1 patch onto the skin daily as needed (back pain). Remove & Discard patch within 12 hours or as directed by MD) 30 patch 0   lidocaine-prilocaine (EMLA) cream SMARTSIG:sparingly Topical As Directed     Melatonin 10 MG TABS Take 10 mg by  mouth at bedtime as needed (sleep).     nitroGLYCERIN (NITROSTAT) 0.4 MG SL tablet Place 0.4 mg under the tongue every 5 (five) minutes as needed for chest pain.     oxyCODONE (ROXICODONE) 5 MG immediate release tablet Take 1 tablet (5 mg total) by mouth every 6 (six) hours as needed for severe pain. 10 tablet 0   pantoprazole (PROTONIX) 40 MG tablet Take 1 tablet (40 mg total) by mouth daily. 30 tablet 1   senna-docusate (SENOKOT-S) 8.6-50 MG tablet Take 1 tablet by mouth at bedtime as needed for mild constipation. 20 tablet 0   traMADol (ULTRAM) 50 MG tablet Take 50-100 mg by mouth 3 (three) times daily as needed for severe pain.     zolpidem (AMBIEN) 5 MG tablet Take 5 mg by mouth at bedtime.     Labs: Basic Metabolic Panel: Recent Labs  Lab 05/15/23 2359 05/21/23 1038 05/21/23 1054  NA 142 136  137 143  K 4.7 6.0*  6.0* 5.8*  CL 107 111 104  CO2 24  --  25  GLUCOSE 82 87 82  BUN 42* 68* 59*  CREATININE 4.80* 5.80* 5.46*  CALCIUM 9.6  --  9.4   Liver Function Tests: Recent Labs  Lab 05/15/23 2359 05/21/23 1054  AST 10* 18  ALT 9 10  ALKPHOS 76 65  BILITOT 0.5 0.8  PROT 6.0* 6.9  ALBUMIN 3.1* 3.4*   Recent Labs  Lab 05/21/23 1054  AMMONIA 45*   CBC: Recent Labs  Lab 05/15/23 2359 05/21/23 1038 05/21/23 1054  WBC 6.6  --  6.3  NEUTROABS 4.0  --  4.7  HGB  7.8* 7.5*  8.2* 8.6*  HCT 26.0* 22.0*  24.0* 29.5*  MCV 91.9  --  95.8  PLT 161  --  217   Studies/Results: DG Pelvis 1-2 Views  Result Date: 05/21/2023 CLINICAL DATA:  67 year old female with altered mental status. End stage renal disease. Possible fall. EXAM: PELVIS - 1-2 VIEW COMPARISON:  CT Abdomen and Pelvis 04/23/2023. FINDINGS: Portable AP supine view at 1057 hours. Chronic severe asymmetric right hip joint degeneration with subchondral sclerosis and cysts. Stable bone mineralization from the CT last month. Femoral heads remain located. Pelvis appears stable and intact. Superimposed chronic lower  lumbar spine fusion. Superimposed ventral abdominal hernia repair with mesh. Nonobstructed bowel-gas pattern. Calcified iliofemoral atherosclerosis. IMPRESSION: 1. No acute fracture or dislocation identified about the pelvis. 2. Severe chronic right hip degeneration. Electronically Signed   By: Odessa Fleming M.D.   On: 05/21/2023 12:59   DG Chest Portable 1 View  Result Date: 05/21/2023 CLINICAL DATA:  67 year old female with altered mental status. End stage renal disease. EXAM: PORTABLE CHEST 1 VIEW COMPARISON:  Portable chest 05/16/2023 and earlier. FINDINGS: Portable AP semi upright view at 1036 hours. Mildly more rotated to the left. Cardiomegaly not significantly changed. Calcified aortic atherosclerosis. Visualized tracheal air column is within normal limits. Stable right chest dual lumen dialysis type catheter. Allowing for portable technique the lungs are clear. No pneumothorax or pleural effusion. Partially visible cervical ACDF. No acute osseous abnormality identified. Paucity of bowel gas. IMPRESSION: 1. No acute cardiopulmonary abnormality. 2. Stable cardiomegaly, right chest dialysis catheter. Electronically Signed   By: Odessa Fleming M.D.   On: 05/21/2023 12:57   CT Head Wo Contrast  Result Date: 05/21/2023 CLINICAL DATA:  Polytrauma, blunt EXAM: CT HEAD WITHOUT CONTRAST CT CERVICAL SPINE WITHOUT CONTRAST TECHNIQUE: Multidetector CT imaging of the head and cervical spine was performed following the standard protocol without intravenous contrast. Multiplanar CT image reconstructions of the cervical spine were also generated. RADIATION DOSE REDUCTION: This exam was performed according to the departmental dose-optimization program which includes automated exposure control, adjustment of the mA and/or kV according to patient size and/or use of iterative reconstruction technique. COMPARISON:  CT C Spine 01/25/23 FINDINGS: CT HEAD FINDINGS Brain: No evidence of acute infarction, hemorrhage, hydrocephalus,  extra-axial collection or mass lesion/mass effect. There is sequela of mild chronic microvascular ischemic change. Vascular: No hyperdense vessel or unexpected calcification. Skull: Normal. Negative for fracture or focal lesion. Sinuses/Orbits: Middle ear or mastoid effusion. Paranasal sinuses are clear. Orbits are unremarkable. Other: None. CT CERVICAL SPINE FINDINGS Alignment: Straightening of the normal cervical lordosis. Skull base and vertebrae: Postsurgical changes from C4-C6 ACDF. There is solid osseous fusion at C4-C5. The fusion screw on the left at the C5 level is fractured and slightly retracted, unchanged from prior. No evidence of perihardware lucency. Soft tissues and spinal canal: No prevertebral fluid or swelling. No visible canal hematoma. Disc levels:  No evidence high-grade spinal canal stenosis. Upper chest: Aortic atherosclerotic calcifications. Other: None IMPRESSION: 1. No acute intracranial abnormality. 2. No acute fracture or traumatic subluxation of the cervical spine. 3. Postsurgical changes from C4-C6 ACDF. The fusion screw on the left at the C5 level is fractured and slightly retracted, unchanged from prior. Aortic Atherosclerosis (ICD10-I70.0). Electronically Signed   By: Lorenza Cambridge M.D.   On: 05/21/2023 11:41   CT Cervical Spine Wo Contrast  Result Date: 05/21/2023 CLINICAL DATA:  Polytrauma, blunt EXAM: CT HEAD WITHOUT CONTRAST CT CERVICAL SPINE WITHOUT CONTRAST TECHNIQUE: Multidetector CT imaging  of the head and cervical spine was performed following the standard protocol without intravenous contrast. Multiplanar CT image reconstructions of the cervical spine were also generated. RADIATION DOSE REDUCTION: This exam was performed according to the departmental dose-optimization program which includes automated exposure control, adjustment of the mA and/or kV according to patient size and/or use of iterative reconstruction technique. COMPARISON:  CT C Spine 01/25/23 FINDINGS: CT  HEAD FINDINGS Brain: No evidence of acute infarction, hemorrhage, hydrocephalus, extra-axial collection or mass lesion/mass effect. There is sequela of mild chronic microvascular ischemic change. Vascular: No hyperdense vessel or unexpected calcification. Skull: Normal. Negative for fracture or focal lesion. Sinuses/Orbits: Middle ear or mastoid effusion. Paranasal sinuses are clear. Orbits are unremarkable. Other: None. CT CERVICAL SPINE FINDINGS Alignment: Straightening of the normal cervical lordosis. Skull base and vertebrae: Postsurgical changes from C4-C6 ACDF. There is solid osseous fusion at C4-C5. The fusion screw on the left at the C5 level is fractured and slightly retracted, unchanged from prior. No evidence of perihardware lucency. Soft tissues and spinal canal: No prevertebral fluid or swelling. No visible canal hematoma. Disc levels:  No evidence high-grade spinal canal stenosis. Upper chest: Aortic atherosclerotic calcifications. Other: None IMPRESSION: 1. No acute intracranial abnormality. 2. No acute fracture or traumatic subluxation of the cervical spine. 3. Postsurgical changes from C4-C6 ACDF. The fusion screw on the left at the C5 level is fractured and slightly retracted, unchanged from prior. Aortic Atherosclerosis (ICD10-I70.0). Electronically Signed   By: Lorenza Cambridge M.D.   On: 05/21/2023 11:41    ROS: Unable to complete due to patient mental status   Physical Exam: Vitals:   05/21/23 1003 05/21/23 1004 05/21/23 1120 05/21/23 1215  BP: (!) 197/76  (!) 190/83 (!) 169/91  Pulse: 78  79 79  Resp: 13  18 14   Temp: 98.6 F (37 C)   98.5 F (36.9 C)  TempSrc: Oral   Oral  SpO2: 97%  96% 95%  Weight: 68 kg 68 kg    Height: 5\' 2"  (1.575 m) 5\' 2"  (1.575 m)       General: WDWN female in NAD Head: NCAT, sclera not icteric, dry mouth Neck: Supple. No lymphadenopathy Lungs: CTA bilaterally. No wheeze, rales or rhonchi. Breathing is unlabored on RA. Heart: RRR. No murmur, rubs or  gallops.  Abdomen: soft, nontender, mildly distended, +BS Lower extremities:trace edema on R, no edema on L Neuro: somnolent  Dialysis Access: [facility]  Dialysis Orders:  MWF  - [facility]  3.75hrs, BFR 450, DFR 500,  EDW 67.1kg, 2K/ 2Ca  Access: TDC  Heparin 3500 unit bolus Mircera 75 mcg q2wks - last 10/28 Hectorol 5 mcg IV qHD   Assessment/Plan:  AMS - etiology unclear, no obvious infectious process, possibly med related. Doubt uremia with BUN/SCr close to baseline.  See if it improves post HD.   ESRD -  on MWF. Orders written for HD today per regular schedule. K 5.8 - hemolyzed.  Repeat.   Hypertension/volume  - Blood pressure elevated. Does not appear grossly overloaded.  Per chart left 11kg over dry last HD.  May need to readjust dry weight. Counsel on fluid restrictions when more alert.   Anemia of CKD - Hgb 8.6. ESA due 11/11.  Secondary Hyperparathyroidism -  Ca in goal.  Check phos. Continue home meds.   Nutrition - Renal diet w/fluid restrictions.   Virgina Norfolk, PA-C [facility] 05/21/2023, 1:55 PM

## 2023-05-21 NOTE — ED Notes (Signed)
PT was awoken and asked if she needed to urinate. She stated she did, she was wet in her brief. Bladder scan done per Admitting MD. Pt changed and urinated in bedpan. UDS sent. New brief placed on patient. She is more alert now asking what happened and how she got here.

## 2023-05-21 NOTE — ED Notes (Signed)
ED TO INPATIENT HANDOFF REPORT  ED Nurse Name and Phone #: Melburn Popper Name/Age/Gender Sherry [name] 67 y.o. female Room/Bed: 018C/018C  Code Status   Code Status: Full Code  Home/SNF/Other Home Patient oriented to: self and place Is this baseline? No   Triage Complete: Triage complete  Chief Complaint Acute encephalopathy [G93.40]  Triage Note Patient presents to ed via [facility] per family patient wasn't acting herself. Pateint is a dialysis patient M-W_F hasn't missed any treatments. Per ems she was a/ox4 , patient is alert and oriented at ED. However she is very sleepy acting denies taking any medication .    Allergies Allergies  Allergen Reactions   Abilify [Aripiprazole] Other (See Comments)    Tardive dyskinesia Oral   Remeron [Mirtazapine] Other (See Comments)    Wgt stimulation /gain, Dizziness, Patient says "can tolerate"   Trazodone And Nefazodone Other (See Comments)    Nightmares/sleep diturbance   Flexeril [Cyclobenzaprine] Other (See Comments)    Pt states Flexeril makes her feel depressed    Amoxicillin Diarrhea and Other (See Comments)    NOTE the patient has had PCN WITHOUT reaction Has patient had a PCN reaction causing immediate rash, facial/tongue/throat swelling, SOB or lightheadedness with hypotension: No Has patient had a PCN reaction causing severe rash involving mucus membranes or skin necrosis: No Has patient had a PCN reaction that required hospitalization: No Has patient had a PCN reaction occurring within the last 10 years: No If all of the above answers are "NO", then may proceed with Cephalosporin use.     Level of Care/Admitting Diagnosis ED Disposition     ED Disposition  Admit   Condition  --   Comment  Hospital Area: [facility] [[id number]]  Level of Care: Progressive [102]  Admit to Progressive based on following criteria: NEUROLOGICAL AND NEUROSURGICAL complex patients with significant risk of instability, who  do not meet ICU criteria, yet require close observation or frequent assessment (< / = every 2 - 4 hours) with medical / nursing intervention.  Admit to Progressive based on following criteria: NEPHROLOGY stable condition requiring close monitoring for AKI, requiring Hemodialysis or Peritoneal Dialysis either from expected electrolyte imbalance, acidosis, or fluid overload that can be managed by NIPPV or high flow oxygen.  May place patient in observation at [facility] or [facility] if equivalent level of care is available:: No  Covid Evaluation: Asymptomatic - no recent exposure (last 10 days) testing not required  Diagnosis: Acute encephalopathy [308657]  Admitting Physician: Earl Lagos [[id number]]  Attending Physician: Earl Lagos [[id number]]          B Medical/Surgery History Past Medical History:  Diagnosis Date   Alcohol use disorder, severe, dependence (HCC) 08/27/2017   Anxiety    Body mass index (BMI) 40.0-44.9, adult (HCC) 07/19/2020   Breast cancer (HCC)    right lumpectemy and lymph node    Cervical spondylosis 05/24/2020   Chronic bilateral low back pain with right-sided sciatica 05/24/2020   Chronic diastolic CHF (congestive heart failure) (HCC) 05/09/2019   Cigarette smoker 12/31/2017   ESRD (end stage renal disease) on dialysis (HCC) 08/22/2021   M-W-F   Essential hypertension 07/06/2017   GERD (gastroesophageal reflux disease)    Hepatitis C virus infection cured after antiviral drug therapy 12/17/2012   Telephone Encounter - Theophilus Kinds, RN - 12/28/2016 9:54 AM EDT Patient called for HCV RNA test results. EOT 08-26-16 - not detected. 12 week post treatment 12-11-16 - not detected. Informed patient  that she was cured. Patient verbalized understanding.          Hx of bipolar disorder 01/31/2016   Impaired functional mobility, balance, gait, and endurance 01/09/2021   MRSA infection    of breast incision   Neuroleptic-induced tardive dyskinesia 09/06/2017    Abilify   Opioid use disorder, severe, dependence (HCC) 08/27/2017   Restless legs syndrome 02/13/2013   Formatting of this note might be different from the original. IMPRESSION: Possible. Will follow.   Subacute dyskinesia due to drug 02/13/2013   Formatting of this note might be different from the original. STORY: Tardive dyskinesia and mild limb dyskinesia, LE>UE which likely due to prolonged antipsychotic used, abilify. Couldn't tolerate artane, gabapentin, clonazepam and xenazine.`E1o3L`IMPRESSION: Well tolerated depakote 250mg  bid, mood aspect is better as well. Will increase to 500mg  bid. ADR was discussed.  RTC 4-6 weeks.   TIA (transient ischemic attack) 2020   P/w BP >230/120 and neurologic symptoms, presumed TIA   Past Surgical History:  Procedure Laterality Date   A/V FISTULAGRAM Right 01/29/2022   Procedure: A/V Fistulagram;  Surgeon: Cephus Shelling, MD;  Location: [facility] INVASIVE CV LAB;  Service: Cardiovascular;  Laterality: Right;   AV FISTULA PLACEMENT Right 05/01/2021   Procedure: RIGHT ARTERIOVENOUS (AV) FISTULA CREATION;  Surgeon: Victorino Sparrow, MD;  Location: [facility] OR;  Service: Vascular;  Laterality: Right;  PERIPHERAL NERVE BLOCK   BACK SURGERY  1990   BASCILIC VEIN TRANSPOSITION Right 09/02/2021   Procedure: RIGHT SECOND STAGE BASILIC VEIN TRANSPOSITION;  Surgeon: Victorino Sparrow, MD;  Location: [facility] OR;  Service: Vascular;  Laterality: Right;  PERIPHERAL NERVE BLOCK   BREAST SURGERY Right 2010   "breast cancer survivor" - states partial mastectomy and nodes   COLONOSCOPY     [facility]   ESOPHAGOGASTRODUODENOSCOPY     [facility]   ESOPHAGOGASTRODUODENOSCOPY  04/18/2020   [facility]   LAPAROSCOPIC CHOLECYSTECTOMY  2002   LAPAROSCOPIC INCISIONAL / UMBILICAL / VENTRAL HERNIA REPAIR  04/13/2018   with BARD 15x 20cm mesh (supraumbilical)   LAPAROSCOPIC LYSIS OF ADHESIONS  07/12/2018   Procedure: LAPAROSCOPIC LYSIS OF ADHESIONS;  Surgeon: Shonna Chock, MD;  Location: [facility] ORS;  Service: Gynecology;;   MULTIPLE TOOTH EXTRACTIONS     MYOMECTOMY     x 2 prior to hysterectomy   PERIPHERAL VASCULAR BALLOON ANGIOPLASTY Right 01/29/2022   Procedure: PERIPHERAL VASCULAR BALLOON ANGIOPLASTY;  Surgeon: Cephus Shelling, MD;  Location: [facility] INVASIVE CV LAB;  Service: Cardiovascular;  Laterality: Right;  arm fistula   ROBOTIC ASSISTED BILATERAL SALPINGO OOPHERECTOMY Right 07/12/2018   Procedure: XI ROBOTIC ASSISTED RIGHT SALPINGO OOPHORECTOMY;  Surgeon: Shonna Chock, MD;  Location: [facility] ORS;  Service: Gynecology;  Laterality: Right;   TOTAL ABDOMINAL HYSTERECTOMY     fibroids    UPPER GASTROINTESTINAL ENDOSCOPY     WISDOM TOOTH EXTRACTION       A IV Location/Drains/Wounds Patient Lines/Drains/Airways Status     Active Line/Drains/Airways     Name Placement date Placement time Site Days   Peripheral IV 05/21/23 22 G Left;Posterior Hand 05/21/23  1057  Hand  less than 1   Peripheral IV 05/21/23 20 G 1.88" Anterior;Left Forearm 05/21/23  1236  Forearm  less than 1   Fistula / Graft Right Forearm Arteriovenous fistula 05/01/21  0844  Forearm  750   Fistula / Graft Right Upper arm 09/02/21  0945  Upper arm  626   Fistula / Graft Right Upper arm 01/30/22  --  Upper arm  476   Hemodialysis Catheter Right Subclavian --  --  Subclavian  --            Intake/Output Last 24 hours No intake or output data in the 24 hours ending 05/21/23 1440  Labs/Imaging Results for orders placed or performed during the hospital encounter of 05/21/23 (from the past 48 hour(s))  I-Stat venous blood gas, ([facility] ED, [facility], [facility])     Status: Abnormal   Collection Time: 05/21/23 10:38 AM  Result Value Ref Range   pH, Ven 7.649 (HH) 7.25 - 7.43   pCO2, Ven 21.8 (L) 44 - 60 mmHg   pO2, Ven 158 (H) 32 - 45 mmHg   Bicarbonate 24.0 20.0 - 28.0 mmol/L   TCO2 25 22 - 32 mmol/L   O2 Saturation 100 %   Acid-Base Excess 3.0 (H) 0.0 - 2.0 mmol/L   Sodium 136 135 -  145 mmol/L   Potassium 6.0 (H) 3.5 - 5.1 mmol/L   Calcium, Ion 0.86 (LL) 1.15 - 1.40 mmol/L   HCT 22.0 (L) 36.0 - 46.0 %   Hemoglobin 7.5 (L) 12.0 - 15.0 g/dL   Sample type VENOUS    Comment NOTIFIED PHYSICIAN   I-stat chem 8, ED (not at [facility], [facility] or [facility])     Status: Abnormal   Collection Time: 05/21/23 10:38 AM  Result Value Ref Range   Sodium 137 135 - 145 mmol/L   Potassium 6.0 (H) 3.5 - 5.1 mmol/L   Chloride 111 98 - 111 mmol/L   BUN 68 (H) 8 - 23 mg/dL   Creatinine, Ser 1.61 (H) 0.44 - 1.00 mg/dL   Glucose, Bld 87 70 - 99 mg/dL    Comment: Glucose reference range applies only to samples taken after fasting for at least 8 hours.   Calcium, Ion 0.85 (LL) 1.15 - 1.40 mmol/L   TCO2 26 22 - 32 mmol/L   Hemoglobin 8.2 (L) 12.0 - 15.0 g/dL   HCT 09.6 (L) 04.5 - 40.9 %   Comment NOTIFIED PHYSICIAN   CBC with Differential     Status: Abnormal   Collection Time: 05/21/23 10:54 AM  Result Value Ref Range   WBC 6.3 4.0 - 10.5 K/uL   RBC 3.08 (L) 3.87 - 5.11 MIL/uL   Hemoglobin 8.6 (L) 12.0 - 15.0 g/dL   HCT 81.1 (L) 91.4 - 78.2 %   MCV 95.8 80.0 - 100.0 fL   MCH 27.9 26.0 - 34.0 pg   MCHC 29.2 (L) 30.0 - 36.0 g/dL   RDW 95.6 (H) 21.3 - 08.6 %   Platelets 217 150 - 400 K/uL   nRBC 0.0 0.0 - 0.2 %   Neutrophils Relative % 76 %   Neutro Abs 4.7 1.7 - 7.7 K/uL   Lymphocytes Relative 15 %   Lymphs Abs 0.9 0.7 - 4.0 K/uL   Monocytes Relative 7 %   Monocytes Absolute 0.4 0.1 - 1.0 K/uL   Eosinophils Relative 2 %   Eosinophils Absolute 0.2 0.0 - 0.5 K/uL   Basophils Relative 0 %   Basophils Absolute 0.0 0.0 - 0.1 K/uL   Immature Granulocytes 0 %   Abs Immature Granulocytes 0.02 0.00 - 0.07 K/uL    Comment: Performed at [facility], [address], [place], [place] [address]  Comprehensive metabolic panel     Status: Abnormal   Collection Time: 05/21/23 10:54 AM  Result Value Ref Range   Sodium 143 135 - 145 mmol/L  Potassium 5.8 (H) 3.5 - 5.1 mmol/L    Comment: HEMOLYSIS  AT THIS LEVEL MAY AFFECT RESULT   Chloride 104 98 - 111 mmol/L   CO2 25 22 - 32 mmol/L   Glucose, Bld 82 70 - 99 mg/dL    Comment: Glucose reference range applies only to samples taken after fasting for at least 8 hours.   BUN 59 (H) 8 - 23 mg/dL   Creatinine, Ser 1.61 (H) 0.44 - 1.00 mg/dL   Calcium 9.4 8.9 - 09.6 mg/dL   Total Protein 6.9 6.5 - 8.1 g/dL   Albumin 3.4 (L) 3.5 - 5.0 g/dL   AST 18 15 - 41 U/L    Comment: HEMOLYSIS AT THIS LEVEL MAY AFFECT RESULT   ALT 10 0 - 44 U/L    Comment: HEMOLYSIS AT THIS LEVEL MAY AFFECT RESULT   Alkaline Phosphatase 65 38 - 126 U/L   Total Bilirubin 0.8 <1.2 mg/dL    Comment: HEMOLYSIS AT THIS LEVEL MAY AFFECT RESULT   GFR, Estimated 8 (L) >60 mL/min    Comment: (NOTE) Calculated using the CKD-EPI Creatinine Equation (2021)    Anion gap 14 5 - 15    Comment: Performed at [facility], [address] [place], [place] [address]  Ammonia     Status: Abnormal   Collection Time: 05/21/23 10:54 AM  Result Value Ref Range   Ammonia 45 (H) 9 - 35 umol/L    Comment: HEMOLYSIS AT THIS LEVEL MAY AFFECT RESULT Performed at [facility], [address] [place], [place] [address]   Troponin I (High Sensitivity)     Status: None   Collection Time: 05/21/23 10:54 AM  Result Value Ref Range   Troponin I (High Sensitivity) 8 <18 ng/L    Comment: (NOTE) Elevated high sensitivity troponin I (hsTnI) values and significant  changes across serial measurements may suggest ACS but many other  chronic and acute conditions are known to elevate hsTnI results.  Refer to the "Links" section for chest pain algorithms and additional  guidance. Performed at [facility], [address] [place], [place] [address]   Ethanol     Status: None   Collection Time: 05/21/23 10:54 AM  Result Value Ref Range   Alcohol, Ethyl (B) <10 <10 mg/dL    Comment: (NOTE) Lowest detectable limit for serum alcohol is 10 mg/dL.  For medical purposes  only. Performed at [facility], [address], [place], [place] [address]   Troponin I (High Sensitivity)     Status: None   Collection Time: 05/21/23 12:14 PM  Result Value Ref Range   Troponin I (High Sensitivity) 6 <18 ng/L    Comment: (NOTE) Elevated high sensitivity troponin I (hsTnI) values and significant  changes across serial measurements may suggest ACS but many other  chronic and acute conditions are known to elevate hsTnI results.  Refer to the "Links" section for chest pain algorithms and additional  guidance. Performed at [facility], [address] [place], [place] [address]    DG Pelvis 1-2 Views  Result Date: 05/21/2023 CLINICAL DATA:  67 year old female with altered mental status. End stage renal disease. Possible fall. EXAM: PELVIS - 1-2 VIEW COMPARISON:  CT Abdomen and Pelvis 04/23/2023. FINDINGS: Portable AP supine view at 1057 hours. Chronic severe asymmetric right hip joint degeneration with subchondral sclerosis and cysts. Stable bone mineralization from the CT last month. Femoral heads remain located. Pelvis appears stable and  intact. Superimposed chronic lower lumbar spine fusion. Superimposed ventral abdominal hernia repair with mesh. Nonobstructed bowel-gas pattern. Calcified iliofemoral atherosclerosis. IMPRESSION: 1. No acute fracture or dislocation identified about the pelvis. 2. Severe chronic right hip degeneration. Electronically Signed   By: Odessa Fleming M.D.   On: 05/21/2023 12:59   DG Chest Portable 1 View  Result Date: 05/21/2023 CLINICAL DATA:  67 year old female with altered mental status. End stage renal disease. EXAM: PORTABLE CHEST 1 VIEW COMPARISON:  Portable chest 05/16/2023 and earlier. FINDINGS: Portable AP semi upright view at 1036 hours. Mildly more rotated to the left. Cardiomegaly not significantly changed. Calcified aortic atherosclerosis. Visualized tracheal air column is within normal limits. Stable right chest dual lumen  dialysis type catheter. Allowing for portable technique the lungs are clear. No pneumothorax or pleural effusion. Partially visible cervical ACDF. No acute osseous abnormality identified. Paucity of bowel gas. IMPRESSION: 1. No acute cardiopulmonary abnormality. 2. Stable cardiomegaly, right chest dialysis catheter. Electronically Signed   By: Odessa Fleming M.D.   On: 05/21/2023 12:57   CT Head Wo Contrast  Result Date: 05/21/2023 CLINICAL DATA:  Polytrauma, blunt EXAM: CT HEAD WITHOUT CONTRAST CT CERVICAL SPINE WITHOUT CONTRAST TECHNIQUE: Multidetector CT imaging of the head and cervical spine was performed following the standard protocol without intravenous contrast. Multiplanar CT image reconstructions of the cervical spine were also generated. RADIATION DOSE REDUCTION: This exam was performed according to the departmental dose-optimization program which includes automated exposure control, adjustment of the mA and/or kV according to patient size and/or use of iterative reconstruction technique. COMPARISON:  CT C Spine 01/25/23 FINDINGS: CT HEAD FINDINGS Brain: No evidence of acute infarction, hemorrhage, hydrocephalus, extra-axial collection or mass lesion/mass effect. There is sequela of mild chronic microvascular ischemic change. Vascular: No hyperdense vessel or unexpected calcification. Skull: Normal. Negative for fracture or focal lesion. Sinuses/Orbits: Middle ear or mastoid effusion. Paranasal sinuses are clear. Orbits are unremarkable. Other: None. CT CERVICAL SPINE FINDINGS Alignment: Straightening of the normal cervical lordosis. Skull base and vertebrae: Postsurgical changes from C4-C6 ACDF. There is solid osseous fusion at C4-C5. The fusion screw on the left at the C5 level is fractured and slightly retracted, unchanged from prior. No evidence of perihardware lucency. Soft tissues and spinal canal: No prevertebral fluid or swelling. No visible canal hematoma. Disc levels:  No evidence high-grade spinal  canal stenosis. Upper chest: Aortic atherosclerotic calcifications. Other: None IMPRESSION: 1. No acute intracranial abnormality. 2. No acute fracture or traumatic subluxation of the cervical spine. 3. Postsurgical changes from C4-C6 ACDF. The fusion screw on the left at the C5 level is fractured and slightly retracted, unchanged from prior. Aortic Atherosclerosis (ICD10-I70.0). Electronically Signed   By: Lorenza Cambridge M.D.   On: 05/21/2023 11:41   CT Cervical Spine Wo Contrast  Result Date: 05/21/2023 CLINICAL DATA:  Polytrauma, blunt EXAM: CT HEAD WITHOUT CONTRAST CT CERVICAL SPINE WITHOUT CONTRAST TECHNIQUE: Multidetector CT imaging of the head and cervical spine was performed following the standard protocol without intravenous contrast. Multiplanar CT image reconstructions of the cervical spine were also generated. RADIATION DOSE REDUCTION: This exam was performed according to the departmental dose-optimization program which includes automated exposure control, adjustment of the mA and/or kV according to patient size and/or use of iterative reconstruction technique. COMPARISON:  CT C Spine 01/25/23 FINDINGS: CT HEAD FINDINGS Brain: No evidence of acute infarction, hemorrhage, hydrocephalus, extra-axial collection or mass lesion/mass effect. There is sequela of mild chronic microvascular ischemic change. Vascular: No hyperdense vessel or unexpected calcification.  Skull: Normal. Negative for fracture or focal lesion. Sinuses/Orbits: Middle ear or mastoid effusion. Paranasal sinuses are clear. Orbits are unremarkable. Other: None. CT CERVICAL SPINE FINDINGS Alignment: Straightening of the normal cervical lordosis. Skull base and vertebrae: Postsurgical changes from C4-C6 ACDF. There is solid osseous fusion at C4-C5. The fusion screw on the left at the C5 level is fractured and slightly retracted, unchanged from prior. No evidence of perihardware lucency. Soft tissues and spinal canal: No prevertebral fluid or  swelling. No visible canal hematoma. Disc levels:  No evidence high-grade spinal canal stenosis. Upper chest: Aortic atherosclerotic calcifications. Other: None IMPRESSION: 1. No acute intracranial abnormality. 2. No acute fracture or traumatic subluxation of the cervical spine. 3. Postsurgical changes from C4-C6 ACDF. The fusion screw on the left at the C5 level is fractured and slightly retracted, unchanged from prior. Aortic Atherosclerosis (ICD10-I70.0). Electronically Signed   By: Lorenza Cambridge M.D.   On: 05/21/2023 11:41    Pending Labs Unresulted Labs (From admission, onward)     Start     Ordered   05/22/23 0500  Basic metabolic panel  Tomorrow morning,   R        05/21/23 1352   05/22/23 0500  CBC  Tomorrow morning,   R        05/21/23 1352   05/21/23 1427  Rapid urine drug screen (hospital performed)  ONCE - STAT,   STAT        05/21/23 1426   05/21/23 1354  Magnesium  Add-on,   AD        05/21/23 1353   05/21/23 1349  Phosphorus  Once,   R        05/21/23 1352   05/21/23 1349  TSH  Once,   R        05/21/23 1352   05/21/23 1349  Hepatitis B surface antigen  (New Admission Hemo Labs (Hepatitis B))  Once,   R        05/21/23 1353   05/21/23 1349  Hepatitis B surface antibody,quantitative  (New Admission [facility] (Hepatitis B))  Once,   R        05/21/23 1353   05/21/23 1348  Comprehensive metabolic panel  Once,   R        05/21/23 1352   05/21/23 1346  HIV Antibody (routine testing w rflx)  (HIV Antibody (Routine testing w reflex) panel)  Once,   R        05/21/23 1352   Signed and Held  Renal function panel  Once,   R        Signed and Held   Signed and Held  CBC  Once,   R        Signed and Held            Vitals/Pain Today's Vitals   05/21/23 1004 05/21/23 1120 05/21/23 1215 05/21/23 1400  BP:  (!) 190/83 (!) 169/91 (!) 186/92  Pulse:  79 79   Resp:  18 14 11   Temp:   98.5 F (36.9 C)   TempSrc:   Oral   SpO2:  96% 95%   Weight: 149 lb 14.6 oz (68 kg)      Height: 5\' 2"  (1.575 m)     PainSc: 0-No pain  0-No pain     Isolation Precautions No active isolations  Medications Medications  heparin injection 5,000 Units (has no administration in time range)  sodium chloride flush (NS) 0.9 % injection 3  mL (has no administration in time range)  acetaminophen (TYLENOL) tablet 650 mg (has no administration in time range)    Or  acetaminophen (TYLENOL) suppository 650 mg (has no administration in time range)  senna-docusate (Senokot-S) tablet 1 tablet (has no administration in time range)  nicotine (NICODERM CQ - dosed in mg/24 hr) patch 7 mg (7 mg Transdermal Patch Applied 05/21/23 1410)  Chlorhexidine Gluconate Cloth 2 % PADS 6 each (has no administration in time range)    Mobility walks     Focused Assessments     R Recommendations: See Admitting Provider Note  Report given to:   Additional Notes:

## 2023-05-21 NOTE — Evaluation (Addendum)
Clinical/Bedside Swallow Evaluation Patient Details  Name: Lundin Kopelman MRN: [id number] Date of Birth: 1956/03/25  Today's Date: 05/21/2023 Time: SLP Start Time (ACUTE ONLY): 1604 SLP Stop Time (ACUTE ONLY): 1615 SLP Time Calculation (min) (ACUTE ONLY): 11 min  Past Medical History:  Past Medical History:  Diagnosis Date   Alcohol use disorder, severe, dependence (HCC) 08/27/2017   Anxiety    Body mass index (BMI) 40.0-44.9, adult (HCC) 07/19/2020   Breast cancer (HCC)    right lumpectemy and lymph node    Cervical spondylosis 05/24/2020   Chronic bilateral low back pain with right-sided sciatica 05/24/2020   Chronic diastolic CHF (congestive heart failure) (HCC) 05/09/2019   Cigarette smoker 12/31/2017   ESRD (end stage renal disease) on dialysis (HCC) 08/22/2021   M-W-F   Essential hypertension 07/06/2017   GERD (gastroesophageal reflux disease)    Hepatitis C virus infection cured after antiviral drug therapy 12/17/2012   Telephone Encounter - Theophilus Kinds, RN - 12/28/2016 9:54 AM EDT Patient called for HCV RNA test results. EOT 08-26-16 - not detected. 12 week post treatment 12-11-16 - not detected. Informed patient that she was cured. Patient verbalized understanding.          Hx of bipolar disorder 01/31/2016   Impaired functional mobility, balance, gait, and endurance 01/09/2021   MRSA infection    of breast incision   Neuroleptic-induced tardive dyskinesia 09/06/2017   Abilify   Opioid use disorder, severe, dependence (HCC) 08/27/2017   Restless legs syndrome 02/13/2013   Formatting of this note might be different from the original. IMPRESSION: Possible. Will follow.   Subacute dyskinesia due to drug 02/13/2013   Formatting of this note might be different from the original. STORY: Tardive dyskinesia and mild limb dyskinesia, LE>UE which likely due to prolonged antipsychotic used, abilify. Couldn't tolerate artane, gabapentin, clonazepam and xenazine.`E1o3L`IMPRESSION: Well  tolerated depakote 250mg  bid, mood aspect is better as well. Will increase to 500mg  bid. ADR was discussed.  RTC 4-6 weeks.   TIA (transient ischemic attack) 2020   P/w BP >230/120 and neurologic symptoms, presumed TIA   Past Surgical History:  Past Surgical History:  Procedure Laterality Date   A/V FISTULAGRAM Right 01/29/2022   Procedure: A/V Fistulagram;  Surgeon: Cephus Shelling, MD;  Location: [facility] INVASIVE CV LAB;  Service: Cardiovascular;  Laterality: Right;   AV FISTULA PLACEMENT Right 05/01/2021   Procedure: RIGHT ARTERIOVENOUS (AV) FISTULA CREATION;  Surgeon: Victorino Sparrow, MD;  Location: [facility] OR;  Service: Vascular;  Laterality: Right;  PERIPHERAL NERVE BLOCK   BACK SURGERY  1990   BASCILIC VEIN TRANSPOSITION Right 09/02/2021   Procedure: RIGHT SECOND STAGE BASILIC VEIN TRANSPOSITION;  Surgeon: Victorino Sparrow, MD;  Location: [facility] OR;  Service: Vascular;  Laterality: Right;  PERIPHERAL NERVE BLOCK   BREAST SURGERY Right 2010   "breast cancer survivor" - states partial mastectomy and nodes   COLONOSCOPY     [facility]   ESOPHAGOGASTRODUODENOSCOPY     [facility]   ESOPHAGOGASTRODUODENOSCOPY  04/18/2020   [facility]   LAPAROSCOPIC CHOLECYSTECTOMY  2002   LAPAROSCOPIC INCISIONAL / UMBILICAL / VENTRAL HERNIA REPAIR  04/13/2018   with BARD 15x 20cm mesh (supraumbilical)   LAPAROSCOPIC LYSIS OF ADHESIONS  07/12/2018   Procedure: LAPAROSCOPIC LYSIS OF ADHESIONS;  Surgeon: Shonna Chock, MD;  Location: [facility] ORS;  Service: Gynecology;;   MULTIPLE TOOTH EXTRACTIONS     MYOMECTOMY     x 2 prior to hysterectomy   PERIPHERAL VASCULAR BALLOON  ANGIOPLASTY Right 01/29/2022   Procedure: PERIPHERAL VASCULAR BALLOON ANGIOPLASTY;  Surgeon: Cephus Shelling, MD;  Location: [facility] INVASIVE CV LAB;  Service: Cardiovascular;  Laterality: Right;  arm fistula   ROBOTIC ASSISTED BILATERAL SALPINGO OOPHERECTOMY Right 07/12/2018   Procedure: XI ROBOTIC ASSISTED RIGHT SALPINGO  OOPHORECTOMY;  Surgeon: Shonna Chock, MD;  Location: [facility] ORS;  Service: Gynecology;  Laterality: Right;   TOTAL ABDOMINAL HYSTERECTOMY     fibroids    UPPER GASTROINTESTINAL ENDOSCOPY     WISDOM TOOTH EXTRACTION     HPI:  Soren Langworthy is a 67 yo female presenting to ED 11/8 with AMS. CXR and CTH negative. PMH includes ESRD on HD, HTN, anxiety, bipolar disorders, h/o of substance abuse  .    Assessment / Plan / Recommendation  Clinical Impression  Pt only rousing for periods of <1 minute at a time given Max cueing and attempts at arousal. She was intermittently able to follow commands to complete an oral motor exam, which was grossly WFL. Attempted presentations of ice chips and thin liquids with reduced labial seal and anterior spillage of majority of each bolus. Pt is not currently exhibiting a level of alertness appropriate for POs. SLP will f/u for further assessment as mentation allows. SLP Visit Diagnosis: Dysphagia, unspecified (R13.10)    Aspiration Risk  Mild aspiration risk    Diet Recommendation NPO    Medication Administration: Via alternative means    Other  Recommendations Oral Care Recommendations: Oral care QID;Staff/trained caregiver to provide oral care    Recommendations for follow up therapy are one component of a multi-disciplinary discharge planning process, led by the attending physician.  Recommendations may be updated based on patient status, additional functional criteria and insurance authorization.  Follow up Recommendations Skilled nursing-short term rehab (<3 hours/day)      Assistance Recommended at Discharge    Functional Status Assessment Patient has had a recent decline in their functional status and demonstrates the ability to make significant improvements in function in a reasonable and predictable amount of time.  Frequency and Duration min 2x/week  1 week       Prognosis Prognosis for improved oropharyngeal function: Good Barriers to Reach  Goals: Cognitive deficits      Swallow Study   General HPI: Shynia Proske is a 67 yo female presenting to ED 11/8 with AMS. CXR and CTH negative. PMH includes ESRD on HD, HTN, anxiety, bipolar disorders, h/o of substance abuse  . Type of Study: Bedside Swallow Evaluation Previous Swallow Assessment: none in chart Diet Prior to this Study: NPO Temperature Spikes Noted: No Respiratory Status: Room air History of Recent Intubation: No Behavior/Cognition: Lethargic/Drowsy;Requires cueing Oral Cavity Assessment: Within Functional Limits Oral Care Completed by SLP: No Oral Cavity - Dentition: Other (Comment) (not able to fully visualize) Vision: Functional for self-feeding Self-Feeding Abilities: Total assist Patient Positioning: Upright in bed Baseline Vocal Quality: Normal Volitional Cough: Cognitively unable to elicit Volitional Swallow: Unable to elicit    Oral/Motor/Sensory Function Overall Oral Motor/Sensory Function: Within functional limits   Ice Chips Ice chips: Impaired Presentation: Spoon Oral Phase Impairments: Poor awareness of bolus;Reduced labial seal;Reduced lingual movement/coordination   Thin Liquid Thin Liquid: Impaired Presentation: Straw Oral Phase Impairments: Poor awareness of bolus;Reduced labial seal    Nectar Thick Nectar Thick Liquid: Not tested   Honey Thick Honey Thick Liquid: Not tested   Puree Puree: Not tested   Solid     Solid: Not tested     Gwynneth Aliment, M.A.,  CF-SLP Speech Language Pathology, Acute Rehabilitation Services  Secure Chat preferred [phone]  05/21/2023,4:26 PM

## 2023-05-21 NOTE — ED Notes (Signed)
Pt unable to answer admission questions at this time.

## 2023-05-21 NOTE — ED Notes (Signed)
Pt will arouse a little easier now. Speech is less slurred but she still extremely tired and will not answer more than one or two questions at a time.

## 2023-05-21 NOTE — ED Triage Notes (Signed)
Patient presents to ed via [facility] per family patient wasn't acting herself. Pateint is a dialysis patient M-W_F hasn't missed any treatments. Per ems she was a/ox4 , patient is alert and oriented at ED. However she is very sleepy acting denies taking any medication .

## 2023-05-21 NOTE — ED Provider Notes (Signed)
[place] EMERGENCY DEPARTMENT AT [facility] Provider Note   CSN: [id number] Arrival date & time: 05/21/23  [id number]     History  Chief Complaint  Patient presents with   Altered Mental Status    Sherry [name] is a 67 y.o. female.  Level 5 caveat is patient is somnolent.  When she awakes she does not answer questions appropriately but she denies any pain.  She cannot really tell me what is been going on today and last night.  She states that she lives by herself.  Per EMS supposedly family said that she was not acting herself but I cannot get in touch with family.  She keeps telling me that she lives by herself.  She has a history of end-stage renal disease on dialysis.  This was to be at dialysis today.  She does take some sedating medications including diazepam.  She does have high blood pressure.  History of alcohol use, chronic pain.  Looks like she has a prescription for diazepam and tramadol as well.  No Narcan given.  Blood sugar normal.  Blood pressure was elevated in the field.  The history is provided by the patient.       Home Medications Prior to Admission medications   Medication Sig Start Date End Date Taking? Authorizing Provider  acetaminophen (TYLENOL) 325 MG tablet Take 2 tablets (650 mg total) by mouth every 6 (six) hours as needed. 01/02/23   Sloan Leiter, DO  albuterol (VENTOLIN HFA) 108 (90 Base) MCG/ACT inhaler Inhale 2 puffs into the lungs every 4 (four) hours as needed for wheezing or shortness of breath. 04/23/21   [provider]  BELBUCA 600 MCG FILM Take 1 Film by mouth 2 (two) times daily. 01/12/22   [provider]  BELBUCA 750 MCG FILM SMARTSIG:1 Strip(s) By Mouth Twice Daily PRN 02/06/22   [provider]  diclofenac Sodium (VOLTAREN) 1 % GEL Apply 4 g topically 4 (four) times daily. 04/06/23   Melene Plan, DO  FLUoxetine (PROZAC) 40 MG capsule Take 40 mg by mouth in the morning. 11/09/18   [provider]   gabapentin (NEURONTIN) 400 MG capsule Take 400 mg by mouth 2 (two) times daily. 12/28/21   [provider]  hydrALAZINE (APRESOLINE) 50 MG tablet Take 50 mg by mouth 3 (three) times daily. 03/24/21   [provider]  INGREZZA 80 MG CAPS Take 80 mg by mouth at bedtime. 10/15/17   [provider]  isosorbide dinitrate (ISORDIL) 20 MG tablet Take 20 mg by mouth 3 (three) times daily. 12/30/21   [provider]  labetalol (NORMODYNE) 200 MG tablet Take 200 mg by mouth 2 (two) times daily. 12/26/21   [provider]  lidocaine (LIDODERM) 5 % Place 1 patch onto the skin daily. Remove & Discard patch within 12 hours or as directed by MD Patient taking differently: Place 1 patch onto the skin daily as needed (back pain). Remove & Discard patch within 12 hours or as directed by MD 12/27/21   Marita Kansas, PA-C  lidocaine-prilocaine (EMLA) cream SMARTSIG:sparingly Topical As Directed 10/28/21   [provider]  Melatonin 10 MG TABS Take 10 mg by mouth at bedtime as needed (sleep).    [provider]  nitroGLYCERIN (NITROSTAT) 0.4 MG SL tablet Place 0.4 mg under the tongue every 5 (five) minutes as needed for chest pain. 12/30/21   [provider]  oxyCODONE (ROXICODONE) 5 MG immediate release tablet Take 1 tablet (5  mg total) by mouth every 6 (six) hours as needed for severe pain. 01/02/23   Sloan Leiter, DO  pantoprazole (PROTONIX) 40 MG tablet Take 1 tablet (40 mg total) by mouth daily. 02/01/22 04/02/22  Meredeth Ide, MD  senna-docusate (SENOKOT-S) 8.6-50 MG tablet Take 1 tablet by mouth at bedtime as needed for mild constipation. 03/02/23   Long, Arlyss Repress, MD  traMADol (ULTRAM) 50 MG tablet Take 50-100 mg by mouth 3 (three) times daily as needed for severe pain.    [provider]  zolpidem (AMBIEN) 5 MG tablet Take 5 mg by mouth at bedtime. 01/05/22   [provider]      Allergies    Abilify [aripiprazole], Remeron  [mirtazapine], Trazodone and nefazodone, Flexeril [cyclobenzaprine], and Amoxicillin    Review of Systems   Review of Systems  Physical Exam Updated Vital Signs BP (!) 169/91 (BP Location: Left Arm)   Pulse 79   Temp 98.5 F (36.9 C) (Oral)   Resp 14   Ht 5\' 2"  (1.575 m)   Wt 68 kg   SpO2 95%   BMI 27.42 kg/m  Physical Exam Vitals and nursing note reviewed.  Constitutional:      General: She is not in acute distress.    Appearance: She is well-developed.  HENT:     Head: Normocephalic and atraumatic.  Eyes:     Conjunctiva/sclera: Conjunctivae normal.  Cardiovascular:     Rate and Rhythm: Normal rate and regular rhythm.     Heart sounds: No murmur heard. Pulmonary:     Effort: Pulmonary effort is normal. No respiratory distress.     Breath sounds: Normal breath sounds.  Abdominal:     Palpations: Abdomen is soft.     Tenderness: There is no abdominal tenderness.  Musculoskeletal:        General: No swelling.     Cervical back: Neck supple.  Skin:    General: Skin is warm and dry.     Capillary Refill: Capillary refill takes less than 2 seconds.  Neurological:     Mental Status: She is alert.     Comments: Somnolent but arousable, cranial nerves appear to be intact strength and sensation appear to be intact but she is pretty sleepy.  Psychiatric:        Mood and Affect: Mood normal.     ED Results / Procedures / Treatments   Labs (all labs ordered are listed, but only abnormal results are displayed) Labs Reviewed  CBC WITH DIFFERENTIAL/PLATELET - Abnormal; Notable for the following components:      Result Value   RBC 3.08 (*)    Hemoglobin 8.6 (*)    HCT 29.5 (*)    MCHC 29.2 (*)    RDW 17.1 (*)    All other components within normal limits  COMPREHENSIVE METABOLIC PANEL - Abnormal; Notable for the following components:   Potassium 5.8 (*)    BUN 59 (*)    Creatinine, Ser 5.46 (*)    Albumin 3.4 (*)    GFR, Estimated 8 (*)    All other components  within normal limits  AMMONIA - Abnormal; Notable for the following components:   Ammonia 45 (*)    All other components within normal limits  I-STAT VENOUS BLOOD GAS, ED - Abnormal; Notable for the following components:   pH, Ven 7.649 (*)    pCO2, Ven 21.8 (*)    pO2, Ven 158 (*)    Acid-Base Excess 3.0 (*)  Potassium 6.0 (*)    Calcium, Ion 0.86 (*)    HCT 22.0 (*)    Hemoglobin 7.5 (*)    All other components within normal limits  I-STAT CHEM 8, ED - Abnormal; Notable for the following components:   Potassium 6.0 (*)    BUN 68 (*)    Creatinine, Ser 5.80 (*)    Calcium, Ion 0.85 (*)    Hemoglobin 8.2 (*)    HCT 24.0 (*)    All other components within normal limits  ETHANOL  TROPONIN I (HIGH SENSITIVITY)  TROPONIN I (HIGH SENSITIVITY)    EKG None  Radiology DG Pelvis 1-2 Views  Result Date: 05/21/2023 CLINICAL DATA:  67 year old female with altered mental status. End stage renal disease. Possible fall. EXAM: PELVIS - 1-2 VIEW COMPARISON:  CT Abdomen and Pelvis 04/23/2023. FINDINGS: Portable AP supine view at 1057 hours. Chronic severe asymmetric right hip joint degeneration with subchondral sclerosis and cysts. Stable bone mineralization from the CT last month. Femoral heads remain located. Pelvis appears stable and intact. Superimposed chronic lower lumbar spine fusion. Superimposed ventral abdominal hernia repair with mesh. Nonobstructed bowel-gas pattern. Calcified iliofemoral atherosclerosis. IMPRESSION: 1. No acute fracture or dislocation identified about the pelvis. 2. Severe chronic right hip degeneration. Electronically Signed   By: Odessa Fleming M.D.   On: 05/21/2023 12:59   DG Chest Portable 1 View  Result Date: 05/21/2023 CLINICAL DATA:  67 year old female with altered mental status. End stage renal disease. EXAM: PORTABLE CHEST 1 VIEW COMPARISON:  Portable chest 05/16/2023 and earlier. FINDINGS: Portable AP semi upright view at 1036 hours. Mildly more rotated to the  left. Cardiomegaly not significantly changed. Calcified aortic atherosclerosis. Visualized tracheal air column is within normal limits. Stable right chest dual lumen dialysis type catheter. Allowing for portable technique the lungs are clear. No pneumothorax or pleural effusion. Partially visible cervical ACDF. No acute osseous abnormality identified. Paucity of bowel gas. IMPRESSION: 1. No acute cardiopulmonary abnormality. 2. Stable cardiomegaly, right chest dialysis catheter. Electronically Signed   By: Odessa Fleming M.D.   On: 05/21/2023 12:57   CT Head Wo Contrast  Result Date: 05/21/2023 CLINICAL DATA:  Polytrauma, blunt EXAM: CT HEAD WITHOUT CONTRAST CT CERVICAL SPINE WITHOUT CONTRAST TECHNIQUE: Multidetector CT imaging of the head and cervical spine was performed following the standard protocol without intravenous contrast. Multiplanar CT image reconstructions of the cervical spine were also generated. RADIATION DOSE REDUCTION: This exam was performed according to the departmental dose-optimization program which includes automated exposure control, adjustment of the mA and/or kV according to patient size and/or use of iterative reconstruction technique. COMPARISON:  CT C Spine 01/25/23 FINDINGS: CT HEAD FINDINGS Brain: No evidence of acute infarction, hemorrhage, hydrocephalus, extra-axial collection or mass lesion/mass effect. There is sequela of mild chronic microvascular ischemic change. Vascular: No hyperdense vessel or unexpected calcification. Skull: Normal. Negative for fracture or focal lesion. Sinuses/Orbits: Middle ear or mastoid effusion. Paranasal sinuses are clear. Orbits are unremarkable. Other: None. CT CERVICAL SPINE FINDINGS Alignment: Straightening of the normal cervical lordosis. Skull base and vertebrae: Postsurgical changes from C4-C6 ACDF. There is solid osseous fusion at C4-C5. The fusion screw on the left at the C5 level is fractured and slightly retracted, unchanged from prior. No  evidence of perihardware lucency. Soft tissues and spinal canal: No prevertebral fluid or swelling. No visible canal hematoma. Disc levels:  No evidence high-grade spinal canal stenosis. Upper chest: Aortic atherosclerotic calcifications. Other: None IMPRESSION: 1. No acute intracranial abnormality. 2. No acute  fracture or traumatic subluxation of the cervical spine. 3. Postsurgical changes from C4-C6 ACDF. The fusion screw on the left at the C5 level is fractured and slightly retracted, unchanged from prior. Aortic Atherosclerosis (ICD10-I70.0). Electronically Signed   By: Lorenza Cambridge M.D.   On: 05/21/2023 11:41   CT Cervical Spine Wo Contrast  Result Date: 05/21/2023 CLINICAL DATA:  Polytrauma, blunt EXAM: CT HEAD WITHOUT CONTRAST CT CERVICAL SPINE WITHOUT CONTRAST TECHNIQUE: Multidetector CT imaging of the head and cervical spine was performed following the standard protocol without intravenous contrast. Multiplanar CT image reconstructions of the cervical spine were also generated. RADIATION DOSE REDUCTION: This exam was performed according to the departmental dose-optimization program which includes automated exposure control, adjustment of the mA and/or kV according to patient size and/or use of iterative reconstruction technique. COMPARISON:  CT C Spine 01/25/23 FINDINGS: CT HEAD FINDINGS Brain: No evidence of acute infarction, hemorrhage, hydrocephalus, extra-axial collection or mass lesion/mass effect. There is sequela of mild chronic microvascular ischemic change. Vascular: No hyperdense vessel or unexpected calcification. Skull: Normal. Negative for fracture or focal lesion. Sinuses/Orbits: Middle ear or mastoid effusion. Paranasal sinuses are clear. Orbits are unremarkable. Other: None. CT CERVICAL SPINE FINDINGS Alignment: Straightening of the normal cervical lordosis. Skull base and vertebrae: Postsurgical changes from C4-C6 ACDF. There is solid osseous fusion at C4-C5. The fusion screw on the  left at the C5 level is fractured and slightly retracted, unchanged from prior. No evidence of perihardware lucency. Soft tissues and spinal canal: No prevertebral fluid or swelling. No visible canal hematoma. Disc levels:  No evidence high-grade spinal canal stenosis. Upper chest: Aortic atherosclerotic calcifications. Other: None IMPRESSION: 1. No acute intracranial abnormality. 2. No acute fracture or traumatic subluxation of the cervical spine. 3. Postsurgical changes from C4-C6 ACDF. The fusion screw on the left at the C5 level is fractured and slightly retracted, unchanged from prior. Aortic Atherosclerosis (ICD10-I70.0). Electronically Signed   By: Lorenza Cambridge M.D.   On: 05/21/2023 11:41    Procedures Procedures    Medications Ordered in ED Medications - No data to display  ED Course/ Medical Decision Making/ A&P                                 Medical Decision Making Amount and/or Complexity of Data Reviewed Labs: ordered. Radiology: ordered.  Risk Decision regarding hospitalization.   Rufina Falco is here with altered mental status.  Blood pressure is elevated 197/76 but otherwise vitals unremarkable.  Looks like she had a recent admission for similar presentation at [facility] last month.  Differential diagnosis likely some metabolic process or could be medication related.  She does have prescription for diazepam and tramadol, neurologically she appears intact.  She looks sort of sedated and does not look like she is having a stroke or fever or other concerning process.  She seems sleepy.  She tells me she lives by herself.  She is not really able to provide much history.  Not sure if she has had a fall but there does not appear to be any trauma on exam.  Unable to get in touch with family on the phone.  Sounds like she is supposed to have dialysis today.  Overall will do broad workup but this seems less likely to be a stroke or infectious process.  I do suspect that this is either  dialysis related or medication related.  Patient with potassium of 5.8 with  hemolysis.  EKG does not show any signs of hyperkalemia.  Will have medicine team rechecked this.  Lab work is otherwise unremarkable.  Head CT and neck CT unremarkable per radiology report.  I do suspect may be this is from missing dialysis today or from medications that she is on.  She is improving.  She is hemodynamically stable.  She has no fever.  Have no concern for infectious process.  Chest x-ray and pelvic x-ray per my review interpretation is unremarkable but radiology report is pending.  I talked with Dr. Allena Katz with nephrology who agrees that patient should get dialysis and patient to be admitted to medicine service for further care.  This chart was dictated using voice recognition software.  Despite best efforts to proofread,  errors can occur which can change the documentation meaning.         Final Clinical Impression(s) / ED Diagnoses Final diagnoses:  Altered mental status, unspecified altered mental status type    Rx / DC Orders ED Discharge Orders     None         Virgina Norfolk, DO 05/21/23 1305

## 2023-05-21 NOTE — Hospital Course (Addendum)
   Hyperkalemia  Ammonia  Troponins - Ethanol -  Dialysis - MWF   ---  Nabozny Pseudoencephalopathy  Tramadol? Benzo?  Due for dialysis today  Metabolize Family? Lives b  Somnolent   No fever No white ocunt Neuro  Was similar to   BP 170/ Dr. Allena Katz - will undergo dialysis  Hyperkalemia   [ ]  SLP [ ] Recheck BMP [ ] Neurology [ ]     Recent hospitalization on 04/23/2023 after missing HD due to substance use treated for uncontrolled hypertension and hypertensive encephalopathy.    Medications  Per most recent hospitalization: Amlodipine 10 mg daily Hydralazine 100 mg TID Imdur 120 mg daily Carvedilol 12.5   Per chart review:  Prozac  Melatonin 10 mg Protonix 40 mg daily  Per PDMP review Anselm Lis, MD prescribed patient: - Diazepam 10 mg daily - Tramadol 50 mg - last refill for 180 pills for 30 days - Gabapentin 300 mg TID for 30 days Belcuca - 600 mcg film, Belbuca 750 mcg film - last fill for this was 07/2022

## 2023-05-21 NOTE — H&P (Cosign Needed)
Date: 05/21/2023               Patient Name:  Sherry [name] MRN: [id number]  DOB: Jun 28, 1956 Age / Sex: 67 y.o., female   PCP: Pcp, No         Medical Service: Internal Medicine Teaching Service         Attending Physician: Dr Reymundo Poll MD      First Contact: Dr. Kathleen Lime, MD Pager [phone]    Second Contact: Dr. Morene Crocker, MD Pager [phone]         After Hours (After 5p/  First Contact Pager: [phone]  weekends / holidays): Second Contact Pager: [phone]   SUBJECTIVE   Chief Complaint:  " Altered mental status "    History of Present Illness:  Sherry [name] is a 67 y.o. female with history of anxiety, GERD, chronic diastolic heart failure, tardive dyskinesia, hypertension, alcohol use disorder, hepatitis C, bipolar disorder, polysubstance abuse, chronic back pain, ESRD on hemodialysis MWF who presented top the ED via EMS due to concerns of altered mental status. Per family,patient was very sleepy and not acting herself so they called EMS to bring her to the ED.  Patient is hypersomnolence, couldn't open eyes to answer questions and falls asleep very easily. History taking was limited as patient needed continual sternal rub to stay awake for questioning.  I called to speak to a nurse  at [facility] who reported that the patient had missed 2 sessions prior to coming in on November 6th and found have an increase in 11lbs of her dry weight. Patient was scheduled another HD session on the 7th but she didn't couldn't show up until this hospitalization.    ED Course:   Past Medical History Past Medical History:  Diagnosis Date   Alcohol use disorder, severe, dependence (HCC) 08/27/2017   Anxiety    Body mass index (BMI) 40.0-44.9, adult (HCC) 07/19/2020   Breast cancer (HCC)    right lumpectemy and lymph node    Cervical spondylosis 05/24/2020   Chronic bilateral low back pain with right-sided sciatica 05/24/2020   Chronic diastolic CHF  (congestive heart failure) (HCC) 05/09/2019   Cigarette smoker 12/31/2017   ESRD (end stage renal disease) on dialysis (HCC) 08/22/2021   M-W-F   Essential hypertension 07/06/2017   GERD (gastroesophageal reflux disease)    Hepatitis C virus infection cured after antiviral drug therapy 12/17/2012   Telephone Encounter - Theophilus Kinds, RN - 12/28/2016 9:54 AM EDT Patient called for HCV RNA test results. EOT 08-26-16 - not detected. 12 week post treatment 12-11-16 - not detected. Informed patient that she was cured. Patient verbalized understanding.          Hx of bipolar disorder 01/31/2016   Impaired functional mobility, balance, gait, and endurance 01/09/2021   MRSA infection    of breast incision   Neuroleptic-induced tardive dyskinesia 09/06/2017   Abilify   Opioid use disorder, severe, dependence (HCC) 08/27/2017   Restless legs syndrome 02/13/2013   Formatting of this note might be different from the original. IMPRESSION: Possible. Will follow.   Subacute dyskinesia due to drug 02/13/2013   Formatting of this note might be different from the original. STORY: Tardive dyskinesia and mild limb dyskinesia, LE>UE which likely due to prolonged antipsychotic used, abilify. Couldn't tolerate artane, gabapentin, clonazepam and xenazine.`E1o3L`IMPRESSION: Well tolerated depakote 250mg  bid, mood aspect is better as well. Will increase to 500mg  bid. ADR was discussed.  RTC 4-6 weeks.   TIA (  transient ischemic attack) 2020   P/w BP >230/120 and neurologic symptoms, presumed TIA     Meds:  Per most recent hospitalization: Amlodipine 10 mg daily Hydralazine 100 mg TID Imdur 120 mg daily Carvedilol 12.5   Per chart review: Prozac  Melatonin 10 mg Protonix 40 mg daily  Per PDMP review Anselm Lis, MD prescribed patient: - Diazepam 10 mg daily - Tramadol 50 mg - last refill for 180 pills for 30 days - Gabapentin 300 mg TID for 30 days Belcuca - 600 mcg film, Belbuca 750 mcg film -  last fill for this was 07/2022   Past Surgical History:  Procedure Laterality Date   A/V FISTULAGRAM Right 01/29/2022   Procedure: A/V Fistulagram;  Surgeon: Cephus Shelling, MD;  Location: [facility] INVASIVE CV LAB;  Service: Cardiovascular;  Laterality: Right;   AV FISTULA PLACEMENT Right 05/01/2021   Procedure: RIGHT ARTERIOVENOUS (AV) FISTULA CREATION;  Surgeon: Victorino Sparrow, MD;  Location: [facility] OR;  Service: Vascular;  Laterality: Right;  PERIPHERAL NERVE BLOCK   BACK SURGERY  1990   BASCILIC VEIN TRANSPOSITION Right 09/02/2021   Procedure: RIGHT SECOND STAGE BASILIC VEIN TRANSPOSITION;  Surgeon: Victorino Sparrow, MD;  Location: [facility] OR;  Service: Vascular;  Laterality: Right;  PERIPHERAL NERVE BLOCK   BREAST SURGERY Right 2010   "breast cancer survivor" - states partial mastectomy and nodes   COLONOSCOPY     [facility]   ESOPHAGOGASTRODUODENOSCOPY     [facility]   ESOPHAGOGASTRODUODENOSCOPY  04/18/2020   [place] [facility]   LAPAROSCOPIC CHOLECYSTECTOMY  2002   LAPAROSCOPIC INCISIONAL / UMBILICAL / VENTRAL HERNIA REPAIR  04/13/2018   with BARD 15x 20cm mesh (supraumbilical)   LAPAROSCOPIC LYSIS OF ADHESIONS  07/12/2018   Procedure: LAPAROSCOPIC LYSIS OF ADHESIONS;  Surgeon: Shonna Chock, MD;  Location: [facility] ORS;  Service: Gynecology;;   MULTIPLE TOOTH EXTRACTIONS     MYOMECTOMY     x 2 prior to hysterectomy   PERIPHERAL VASCULAR BALLOON ANGIOPLASTY Right 01/29/2022   Procedure: PERIPHERAL VASCULAR BALLOON ANGIOPLASTY;  Surgeon: Cephus Shelling, MD;  Location: [facility] INVASIVE CV LAB;  Service: Cardiovascular;  Laterality: Right;  arm fistula   ROBOTIC ASSISTED BILATERAL SALPINGO OOPHERECTOMY Right 07/12/2018   Procedure: XI ROBOTIC ASSISTED RIGHT SALPINGO OOPHORECTOMY;  Surgeon: Shonna Chock, MD;  Location: [facility] ORS;  Service: Gynecology;  Laterality: Right;   TOTAL ABDOMINAL HYSTERECTOMY     fibroids    UPPER GASTROINTESTINAL ENDOSCOPY     WISDOM TOOTH EXTRACTION       Social: Patient not awake enough to provide social history at this time Lives With:  Occupation:  Support: Self  Level of Function:  PCP:  Substances: Production manager: Code Status:   Family History: Unable to provide   Allergies: Allergies as of 05/21/2023 - Reviewed 05/21/2023  Allergen Reaction Noted   Abilify [aripiprazole] Other (See Comments) 09/06/2017   Remeron [mirtazapine] Other (See Comments) 09/06/2017   Trazodone and nefazodone Other (See Comments) 09/06/2017   Flexeril [cyclobenzaprine] Other (See Comments) 04/14/2015   Amoxicillin Diarrhea and Other (See Comments) 08/12/2017    Review of Systems: A complete ROS was negative except as per HPI.   OBJECTIVE:   Physical Exam: Blood pressure (!) 169/91, pulse 79, temperature 98.5 F (36.9 C), temperature source Oral, resp. rate 14, height 5\' 2"  (1.575 m), weight 68 kg, SpO2 95%.   Constitutional: Laying in bed , very somnolent, couldn't keep eyes open HENT: normocephalic atraumatic, mucous membranes  moist Eyes: Pupils reactive to light. Cardiovascular: regular rate and rhythm, no m/r/g Pulmonary/Chest: normal work of breathing on room air, lungs clear to auscultation bilaterally Abdominal: Distended , not rigid and no guarding or rebound tenderness Skin: warm and dry Psych: Confused   Labs:    Latest Ref Rng & Units 05/21/2023   10:54 AM 05/21/2023   10:38 AM 05/15/2023   11:59 PM  CBC  WBC 4.0 - 10.5 K/uL 6.3   6.6   Hemoglobin 12.0 - 15.0 g/dL 8.6  7.5    8.2  7.8   Hematocrit 36.0 - 46.0 % 29.5  22.0    24.0  26.0   Platelets 150 - 400 K/uL 217   161        CMP     Latest Ref Rng & Units 05/21/2023    2:12 PM 05/21/2023   10:54 AM 05/21/2023   10:38 AM  CMP  Glucose 70 - 99 mg/dL 81  82  87   BUN 8 - 23 mg/dL 57  59  68   Creatinine 0.44 - 1.00 mg/dL 0.86  5.78  4.69   Sodium 135 - 145 mmol/L 142  143  136    137   Potassium 3.5 - 5.1 mmol/L 5.6  5.8  6.0    6.0   Chloride  98 - 111 mmol/L 106  104  111   CO2 22 - 32 mmol/L 25  25    Calcium 8.9 - 10.3 mg/dL 9.2  9.4    Total Protein 6.5 - 8.1 g/dL 6.1  6.9    Total Bilirubin <1.2 mg/dL 1.0  0.8    Alkaline Phos 38 - 126 U/L 61  65    AST 15 - 41 U/L 12  18    ALT 0 - 44 U/L 11  10        EKG: personally reviewed my interpretation is sinus rhythm . Prior EKG 05/15/2023 that's also showed sinus rhythm   ASSESSMENT & PLAN:   Assessment & Plan by Problem: Principal Problem:   Acute encephalopathy   Ashvi Plaisance is a 67 y.o. person living with a history of ESRD,GERD, Polysubstance use  who presented with altered mental status and admitted for Acute encephalopathy.  #Acute Encephalopathy Patient presented due to concerns for altered mental status.  On encounter with the patient she continues to be  somnolent and only awake to sternal rub. Differentials of this presentation include metabolic etiologies versus infections versus structural causes mostly due to trauma vs toxins. Patient is afebrile and chest xray on presentation showed no acute cardiopulmonary abnormality. Her WBC is reassuring. I have low suspicions for any active infection etiology at this time. Patient denies any recent fall and CT head didn't show any acute intracranial abnormality,less concern for any intracranial or structural causes of acute encephalopathy.  It is unclear at this time if patient is uremic or is encephalopathic due to medication overdose . It is suspected that this acute change in mental status could be due to increased medication ingestion in the setting of impaired renal function. Will arrange for an expedited HD dialysis with Dr Allena Katz ( nephrology ) and order a UDS to further evaluate.  #ESRD on HD Patient has a history of end-stage renal disease on hemodialysis MWF at the [facility]. She missed a couple of dialysis and when she showed up for her November 6th session, she was found to have gained 11lbs more  over her dry  weight. Patient was rescheduled to return for HD on the 7th Nov but couldn't show up until now. On exam patient is very somnolent and could barely open her eyes. Wondering if missing her dialysis is causing this acute presentation. Nephrology is consulted and patient is scheduled for an HD tonight. Will reassess patient after HD    #HTN  Patient has a history of hypertension on home hydralazine ,isordil.  Will hold until after hemodialysis tonight.  #Polysubstance Use Disorder  #Chronic pain  History of polysubstance use disorder. Presenting ethanol level is negative. UDS pending   # Hx Anemia  Hgb of 8.6 Pt is been between 7.5-11. No obvious signs of bleeding at this time. Will continue to monitor   #Hx of anxiety #Hx of bipolar Disorder - Hold all bipolar and MDD medications at this time    Diet: NPO VTE: Heparin IVF: None,None Code: Full  Prior to Admission Living Arrangement: Home Anticipated Discharge Location: Home Barriers to Discharge: Medical work up and treatment   Dispo: Admit patient to Observation with expected length of stay less than 2 midnights.  Signed: Kathleen Lime, MD Internal Medicine Resident PGY-1  05/21/2023, 2:28 PM

## 2023-05-22 DIAGNOSIS — F17210 Nicotine dependence, cigarettes, uncomplicated: Secondary | ICD-10-CM | POA: Diagnosis present

## 2023-05-22 DIAGNOSIS — T40425A Adverse effect of tramadol, initial encounter: Secondary | ICD-10-CM | POA: Diagnosis present

## 2023-05-22 DIAGNOSIS — G8929 Other chronic pain: Secondary | ICD-10-CM | POA: Diagnosis present

## 2023-05-22 DIAGNOSIS — T424X5A Adverse effect of benzodiazepines, initial encounter: Secondary | ICD-10-CM | POA: Diagnosis present

## 2023-05-22 DIAGNOSIS — N186 End stage renal disease: Secondary | ICD-10-CM | POA: Diagnosis present

## 2023-05-22 DIAGNOSIS — Y92009 Unspecified place in unspecified non-institutional (private) residence as the place of occurrence of the external cause: Secondary | ICD-10-CM | POA: Diagnosis not present

## 2023-05-22 DIAGNOSIS — I132 Hypertensive heart and chronic kidney disease with heart failure and with stage 5 chronic kidney disease, or end stage renal disease: Secondary | ICD-10-CM | POA: Diagnosis present

## 2023-05-22 DIAGNOSIS — G928 Other toxic encephalopathy: Secondary | ICD-10-CM | POA: Diagnosis present

## 2023-05-22 DIAGNOSIS — F419 Anxiety disorder, unspecified: Secondary | ICD-10-CM | POA: Diagnosis present

## 2023-05-22 DIAGNOSIS — I5032 Chronic diastolic (congestive) heart failure: Secondary | ICD-10-CM | POA: Diagnosis present

## 2023-05-22 DIAGNOSIS — E876 Hypokalemia: Secondary | ICD-10-CM | POA: Diagnosis present

## 2023-05-22 DIAGNOSIS — G2581 Restless legs syndrome: Secondary | ICD-10-CM | POA: Diagnosis present

## 2023-05-22 DIAGNOSIS — Z90710 Acquired absence of both cervix and uterus: Secondary | ICD-10-CM | POA: Diagnosis not present

## 2023-05-22 DIAGNOSIS — D631 Anemia in chronic kidney disease: Secondary | ICD-10-CM | POA: Diagnosis present

## 2023-05-22 DIAGNOSIS — Z91158 Patient's noncompliance with renal dialysis for other reason: Secondary | ICD-10-CM | POA: Diagnosis not present

## 2023-05-22 DIAGNOSIS — N2581 Secondary hyperparathyroidism of renal origin: Secondary | ICD-10-CM | POA: Diagnosis present

## 2023-05-22 DIAGNOSIS — F1320 Sedative, hypnotic or anxiolytic dependence, uncomplicated: Secondary | ICD-10-CM | POA: Diagnosis present

## 2023-05-22 DIAGNOSIS — Z79899 Other long term (current) drug therapy: Secondary | ICD-10-CM

## 2023-05-22 DIAGNOSIS — F1911 Other psychoactive substance abuse, in remission: Secondary | ICD-10-CM | POA: Insufficient documentation

## 2023-05-22 DIAGNOSIS — Z8673 Personal history of transient ischemic attack (TIA), and cerebral infarction without residual deficits: Secondary | ICD-10-CM | POA: Diagnosis not present

## 2023-05-22 DIAGNOSIS — Z8614 Personal history of Methicillin resistant Staphylococcus aureus infection: Secondary | ICD-10-CM | POA: Diagnosis not present

## 2023-05-22 DIAGNOSIS — G9340 Encephalopathy, unspecified: Secondary | ICD-10-CM | POA: Diagnosis present

## 2023-05-22 DIAGNOSIS — F1120 Opioid dependence, uncomplicated: Secondary | ICD-10-CM | POA: Diagnosis present

## 2023-05-22 DIAGNOSIS — F319 Bipolar disorder, unspecified: Secondary | ICD-10-CM | POA: Diagnosis present

## 2023-05-22 DIAGNOSIS — Z992 Dependence on renal dialysis: Secondary | ICD-10-CM | POA: Diagnosis not present

## 2023-05-22 DIAGNOSIS — Z8619 Personal history of other infectious and parasitic diseases: Secondary | ICD-10-CM | POA: Diagnosis not present

## 2023-05-22 DIAGNOSIS — T426X5A Adverse effect of other antiepileptic and sedative-hypnotic drugs, initial encounter: Secondary | ICD-10-CM | POA: Diagnosis present

## 2023-05-22 DIAGNOSIS — Z853 Personal history of malignant neoplasm of breast: Secondary | ICD-10-CM | POA: Diagnosis not present

## 2023-05-22 LAB — RENAL FUNCTION PANEL
Albumin: 3 g/dL — ABNORMAL LOW (ref 3.5–5.0)
Anion gap: 13 (ref 5–15)
BUN: 27 mg/dL — ABNORMAL HIGH (ref 8–23)
CO2: 24 mmol/L (ref 22–32)
Calcium: 9.1 mg/dL (ref 8.9–10.3)
Chloride: 98 mmol/L (ref 98–111)
Creatinine, Ser: 3.45 mg/dL — ABNORMAL HIGH (ref 0.44–1.00)
GFR, Estimated: 14 mL/min — ABNORMAL LOW (ref >60)
Glucose, Bld: 65 mg/dL — ABNORMAL LOW (ref 70–99)
Phosphorus: 4.7 mg/dL — ABNORMAL HIGH (ref 2.5–4.6)
Potassium: 3.8 mmol/L (ref 3.5–5.1)
Sodium: 135 mmol/L (ref 135–145)

## 2023-05-22 LAB — FERRITIN: Ferritin: 688 ng/mL — ABNORMAL HIGH (ref 11–307)

## 2023-05-22 LAB — GLUCOSE, CAPILLARY: Glucose-Capillary: 76 mg/dL (ref 70–99)

## 2023-05-22 LAB — MAGNESIUM: Magnesium: 1.8 mg/dL (ref 1.7–2.4)

## 2023-05-22 LAB — CBC
HCT: 27.5 % — ABNORMAL LOW (ref 36.0–46.0)
Hemoglobin: 8.4 g/dL — ABNORMAL LOW (ref 12.0–15.0)
MCH: 27.7 pg (ref 26.0–34.0)
MCHC: 30.5 g/dL (ref 30.0–36.0)
MCV: 90.8 fL (ref 80.0–100.0)
Platelets: 178 10*3/uL (ref 150–400)
RBC: 3.03 MIL/uL — ABNORMAL LOW (ref 3.87–5.11)
RDW: 15.9 % — ABNORMAL HIGH (ref 11.5–15.5)
WBC: 3.8 10*3/uL — ABNORMAL LOW (ref 4.0–10.5)
nRBC: 0 % (ref 0.0–0.2)

## 2023-05-22 LAB — IRON AND TIBC
Iron: 36 ug/dL (ref 28–170)
Saturation Ratios: 14 % (ref 10.4–31.8)
TIBC: 260 ug/dL (ref 250–450)
UIBC: 224 ug/dL

## 2023-05-22 LAB — HEPATITIS B SURFACE ANTIBODY, QUANTITATIVE: Hep B S AB Quant (Post): 316 m[IU]/mL

## 2023-05-22 MED ORDER — AMLODIPINE BESYLATE 10 MG PO TABS
10.0000 mg | ORAL_TABLET | Freq: Every day | ORAL | Status: DC
  Administered 2023-05-22 – 2023-05-23 (×2): 10 mg via ORAL
  Filled 2023-05-22 (×2): qty 1

## 2023-05-22 MED ORDER — DIAZEPAM 2 MG PO TABS
10.0000 mg | ORAL_TABLET | Freq: Every day | ORAL | Status: DC
  Administered 2023-05-22 – 2023-05-23 (×2): 10 mg via ORAL
  Filled 2023-05-22 (×2): qty 5

## 2023-05-22 MED ORDER — MELATONIN 5 MG PO TABS
5.0000 mg | ORAL_TABLET | Freq: Every day | ORAL | Status: DC
  Administered 2023-05-22: 5 mg via ORAL
  Filled 2023-05-22: qty 1

## 2023-05-22 MED ORDER — DIAZEPAM 2 MG PO TABS: 10.0000 mg | ORAL_TABLET | ORAL | Status: DC

## 2023-05-22 MED ORDER — HEPARIN SODIUM (PORCINE) 1000 UNIT/ML IJ SOLN
3200.0000 [IU] | Freq: Once | INTRAMUSCULAR | Status: AC
  Administered 2023-05-22: 3200 [IU]

## 2023-05-22 MED ORDER — HEPARIN SODIUM (PORCINE) 1000 UNIT/ML IJ SOLN
INTRAMUSCULAR | Status: AC
  Filled 2023-05-22: qty 4

## 2023-05-22 NOTE — Evaluation (Signed)
Physical Therapy Evaluation Patient Details Name: Sherry [name] MRN: [id number] DOB: 1956/03/07 Today's Date: 05/22/2023  History of Present Illness  67 yo female admitted 11/8 with AMS, hypersomnolence and missed HD. CXR and CTH negative. PMHx: ESRD on HD MWF, HTN, anxiety, bipolar disorder, tardive dyskinesia, hep C, RLS, CHF, Breast CA, h/o substance abuse, chronic back pain  Clinical Impression  Pt initially lethargic on arrival requiring cues, lights and verbal stimulation to arouse with improved attention once EOB. Pt with decreased strength, transfers, cognition and function who will benefit from acute therapy to maximize mobility and safety. Pt lives alone and reports PCA and friends available to assist, anticipate improved function as cognition improves. Will continue to follow and encouraged OOB daily with nursing staff.   VSS          If plan is discharge home, recommend the following: Direct supervision/assist for medications management;Assist for transportation;Supervision due to cognitive status;Help with stairs or ramp for entrance;A little help with walking and/or transfers;A little help with bathing/dressing/bathroom;Assistance with cooking/housework   Can travel by private Tax inspector (2 wheels)  Recommendations for Other Services       Functional Status Assessment Patient has had a recent decline in their functional status and demonstrates the ability to make significant improvements in function in a reasonable and predictable amount of time.     Precautions / Restrictions Precautions Precautions: Fall      Mobility  Bed Mobility Overal bed mobility: Needs Assistance Bed Mobility: Supine to Sit     Supine to sit: Min assist     General bed mobility comments: min assist with max cues and assist to initiate and direct movement    Transfers Overall transfer level: Needs assistance   Transfers: Sit to/from  Stand, Bed to chair/wheelchair/BSC Sit to Stand: Min assist Stand pivot transfers: Min assist         General transfer comment: min assist to initiate movement, cues for sequence from bed and BSC, Min assist to pivot bed to [facility]    Ambulation/Gait Ambulation/Gait assistance: Min assist, +2 safety/equipment Gait Distance (Feet): 75 Feet Assistive device: Rolling walker (2 wheels) Gait Pattern/deviations: Step-through pattern, Decreased stride length   Gait velocity interpretation: 1.31 - 2.62 ft/sec, indicative of limited community ambulator   General Gait Details: pt needing mod cues and encouragement as well as visual target to increase gait distance with close chair follow, pt limited by fatigue  Stairs            Wheelchair Mobility     Tilt Bed    Modified Rankin (Stroke Patients Only)       Balance Overall balance assessment: Needs assistance Sitting-balance support: No upper extremity supported, Feet supported Sitting balance-Leahy Scale: Good     Standing balance support: Bilateral upper extremity supported, Reliant on assistive device for balance, During functional activity Standing balance-Leahy Scale: Poor Standing balance comment: RW in standing                             Pertinent Vitals/Pain Pain Assessment Pain Assessment: No/denies pain    Home Living Family/patient expects to be discharged to:: Private residence Living Arrangements: Alone Available Help at Discharge: Personal care attendant Type of Home: Apartment Home Access: Level entry       Home Layout: One level Home Equipment: None Additional Comments: PCA M-F for 2 hours, pt with confusion and  home setup not the same as last admission so unsure of accuracy    Prior Function Prior Level of Function : Independent/Modified Independent             Mobility Comments: walking without AD per pt ADLs Comments: PcA assist with grocery and meal prep, SCAT for HD      Extremity/Trunk Assessment   Upper Extremity Assessment Upper Extremity Assessment: Generalized weakness    Lower Extremity Assessment Lower Extremity Assessment: Generalized weakness    Cervical / Trunk Assessment Cervical / Trunk Assessment: Normal  Communication   Communication Communication: No apparent difficulties Cueing Techniques: Gestural cues;Tactile cues;Verbal cues  Cognition Arousal: Lethargic Behavior During Therapy: Flat affect Overall Cognitive Status: Impaired/Different from baseline Area of Impairment: Memory, Orientation, Attention, Following commands, Safety/judgement                 Orientation Level: Disoriented to, Place, Time, Situation Current Attention Level: Focused Memory: Decreased short-term memory Following Commands: Follows one step commands inconsistently, Follows one step commands with increased time Safety/Judgement: Decreased awareness of safety, Decreased awareness of deficits     General Comments: pt soaked in urine on arrival with pt unaware. initially lethargic with improved attention after sitting EOB. Needing frequent cues to attend to task due to decreased attention and pt repeatedly trying to take gown off despite education for completed linen change        General Comments      Exercises     Assessment/Plan    PT Assessment Patient needs continued PT services  PT Problem List Decreased strength;Decreased mobility;Decreased activity tolerance;Decreased cognition;Decreased balance;Decreased knowledge of use of DME;Decreased safety awareness       PT Treatment Interventions DME instruction;Gait training;Functional mobility training;Stair training;Therapeutic activities;Patient/family education;Cognitive remediation;Balance training;Therapeutic exercise    PT Goals (Current goals can be found in the Care Plan section)  Acute Rehab PT Goals Patient Stated Goal: return home PT Goal Formulation: With patient Time For  Goal Achievement: 06/05/23 Potential to Achieve Goals: Good    Frequency Min 1X/week     Co-evaluation               AM-PAC PT "6 Clicks" Mobility  Outcome Measure Help needed turning from your back to your side while in a flat bed without using bedrails?: A Little Help needed moving from lying on your back to sitting on the side of a flat bed without using bedrails?: A Little Help needed moving to and from a bed to a chair (including a wheelchair)?: A Little Help needed standing up from a chair using your arms (e.g., wheelchair or bedside chair)?: A Little Help needed to walk in hospital room?: A Little Help needed climbing 3-5 steps with a railing? : A Lot 6 Click Score: 17    End of Session Equipment Utilized During Treatment: Gait belt Activity Tolerance: Patient tolerated treatment well Patient left: in chair;with call bell/phone within reach;with chair alarm set;with nursing/sitter in room Nurse Communication: Mobility status PT Visit Diagnosis: Other abnormalities of gait and mobility (R26.89);Muscle weakness (generalized) (M62.81)    Time: 3151-7616 PT Time Calculation (min) (ACUTE ONLY): 21 min   Charges:   PT Evaluation $PT Eval Moderate Complexity: 1 Mod   PT General Charges $$ ACUTE PT VISIT: 1 Visit         Merryl Hacker, PT Acute Rehabilitation Services Office: [phone]   Enedina Finner Khamari Yousuf 05/22/2023, 10:38 AM

## 2023-05-22 NOTE — Progress Notes (Signed)
Patient ID: Sherry [name], female   DOB: 03/16/1956, 67 y.o.   MRN: [id number] [facility] Progress Note   Assessment/ Plan:   Altered mental status- etiology unclear without acute intracranial process.  Unlikely to be uremic given the lack of any meaningful clinical improvement of mental status with dialysis overnight.  There was initial supposition that this may have been drug-induced with preceding use of Robaxin, gabapentin, oxycodone and tramadol however, no meaningful improvement seen with dialysis.  Will order for extra dialysis again today to see if additional clearance may confer her some benefit.  ESRD -usually on a Monday/Wednesday/Friday schedule and underwent hemodialysis yesterday.  Will order for additional dialysis again today for attempt at drug clearance.  Hypertension/volume  - Blood pressure elevated without evidence of gross volume overload, will reorder dialysis today with ultrafiltration.   Anemia of CKD - Hgb 8.6. ESA due 11/11.  Secondary Hyperparathyroidism -calcium at goal with elevated phosphorus level, mental status prohibitive to diet/phosphorus binder.  Nutrition - Renal diet w/fluid restrictions.   Subjective:   Underwent hemodialysis overnight, remains encephalopathic this morning when seen   Objective:   BP (!) 183/84 (BP Location: Left Arm)   Pulse (!) 222   Temp 98.4 F (36.9 C) (Oral)   Resp 13   Ht 5\' 2"  (1.575 m)   Wt 72.2 kg Comment: one pillow one blanket  SpO2 100%   BMI 29.11 kg/m   Physical Exam: Gen: Somnolent, arouses with loud voice/pain CVS: Pulse regular rhythm, normal rate, S1 and S2 normal.  Right IJ TDC in place Resp: Coarse/transmitted breath sounds bilaterally without distinct rales or rhonchi Abd: Soft, obese, nontender, bowel sounds normal Ext: Trace right leg edema without left leg edema.  Labs: BMET Recent Labs  Lab 05/15/23 2359 05/21/23 1038 05/21/23 1054 05/21/23 1412 05/21/23 2100  NA 142 136  137  143 142 142  K 4.7 6.0*  6.0* 5.8* 5.6* 4.9  CL 107 111 104 106 107  CO2 24  --  25 25 24   GLUCOSE 82 87 82 81 68*  BUN 42* 68* 59* 57* 57*  CREATININE 4.80* 5.80* 5.46* 5.50* 5.65*  CALCIUM 9.6  --  9.4 9.2 8.9  PHOS  --   --   --  7.0* 6.8*   CBC Recent Labs  Lab 05/15/23 2359 05/21/23 1038 05/21/23 1054 05/21/23 2100 05/22/23 0704  WBC 6.6  --  6.3 5.5 3.8*  NEUTROABS 4.0  --  4.7  --   --   HGB 7.8* 7.5*  8.2* 8.6* 7.5* 8.4*  HCT 26.0* 22.0*  24.0* 29.5* 25.6* 27.5*  MCV 91.9  --  95.8 94.5 90.8  PLT 161  --  217 188 178      Medications:     amLODipine  10 mg Oral Daily   Chlorhexidine Gluconate Cloth  6 each Topical Q0600   heparin  5,000 Units Subcutaneous Q8H   nicotine  7 mg Transdermal Daily   sodium chloride flush  3 mL Intravenous Q12H   Zetta Bills, MD 05/22/2023, 8:25 AM

## 2023-05-22 NOTE — Progress Notes (Signed)
Received patient in bed to unit.  Alert and oriented.  Informed consent signed and in chart.   TX duration: 3:30  Patient tolerated well.  Transported back to the room  Alert, without acute distress.  Hand-off given to patient's nurse.   Access used: RIJ TDC Access issues: None  Total UF removed: 4000 mL Medication(s) given: None Post HD VS: please see data insert    05/22/23 0057  Vitals  Temp 98 F (36.7 C)  Temp Source Axillary  BP (!) 154/78  MAP (mmHg) 102  BP Location Left Arm  BP Method Automatic  Patient Position (if appropriate) Lying  Pulse Rate 79  Pulse Rate Source Monitor  ECG Heart Rate 79  Resp 17  Oxygen Therapy  SpO2 96 %  Post Treatment  Dialyzer Clearance Lightly streaked  Hemodialysis Intake (mL) 0 mL  Liters Processed 72.9  Fluid Removed (mL) 4000 mL  Tolerated HD Treatment Yes  Post-Hemodialysis Comments Treatment completed and blood returned without issue.  Note  Patient Observations Patient awakened from sleep; no c/o voiced, no acute distress noted; patient condition stable for return transport.  Hemodialysis Catheter Right Subclavian  No placement date or time found.   Placed prior to admission: Yes  Local Anesthetic: None  Ultrasound Used?: No  Orientation: Right  Access Location: Subclavian  Site Condition No complications  Blue Lumen Status Flushed;Heparin locked;Dead end cap in place  Red Lumen Status Flushed;Heparin locked;Dead end cap in place  Purple Lumen Status N/A  Catheter fill solution Heparin 1000 units/ml  Catheter fill volume (Arterial) 1.6 cc  Catheter fill volume (Venous) 1.6  Dressing Type Transparent  Dressing Status Antimicrobial disc in place;Clean, Dry, Intact  Drainage Description None  Post treatment catheter status Capped and Clamped      [facility]

## 2023-05-22 NOTE — Care Management Obs Status (Signed)
MEDICARE OBSERVATION STATUS NOTIFICATION   Patient Details  Name: Sherry [name] MRN: [id number] Date of Birth: 1956-02-08   Medicare Observation Status Notification Given:  Yes    Ronny Bacon, RN 05/22/2023, 12:59 PM

## 2023-05-22 NOTE — Evaluation (Signed)
Occupational Therapy Evaluation Patient Details Name: Sherry [name] MRN: [id number] DOB: 26-Jan-1956 Today's Date: 05/22/2023   History of Present Illness 67 yo female admitted 11/8 with AMS, hypersomnolence and missed HD. CXR and CTH negative. PMHx: ESRD on HD MWF, HTN, anxiety, bipolar disorder, tardive dyskinesia, hep C, RLS, CHF, Breast CA, h/o substance abuse, chronic back pain   Clinical Impression   PTA, pt lived alone and reports she has aide 7 days/week; noting some discrepancies between pt report to OT vs PT sessions. Per pt and chart, pt questionably sedentary when aide not present. Pt presents with decreased problem solving, memory, cognition, safety, awareness, and balance. Scored a 22 on the Short Blessed Test of cognition indicating impairment consistent with dementia. Pending cognitive progress and aide/family ability to assist at home, recommending HHOT; if aide or family unable to assist with medication management, may need short term rehabilitation to optimize safety with ADL and IADL.       If plan is discharge home, recommend the following: A little help with walking and/or transfers;A little help with bathing/dressing/bathroom;Assistance with cooking/housework;Assist for transportation;Help with stairs or ramp for entrance;Direct supervision/assist for financial management;Direct supervision/assist for medications management    Functional Status Assessment  Patient has had a recent decline in their functional status and demonstrates the ability to make significant improvements in function in a reasonable and predictable amount of time.  Equipment Recommendations  Other (comment) (tbd)    Recommendations for Other Services Speech consult (cog)     Precautions / Restrictions Precautions Precautions: Fall      Mobility Bed Mobility               General bed mobility comments: in chair on arrival    Transfers Overall transfer level: Needs  assistance Equipment used: Rolling walker (2 wheels), None Transfers: Sit to/from Stand, Bed to chair/wheelchair/BSC Sit to Stand: Contact guard assist Stand pivot transfers: Min assist         General transfer comment: assist for balance      Balance Overall balance assessment: Needs assistance Sitting-balance support: No upper extremity supported, Feet supported Sitting balance-Leahy Scale: Good     Standing balance support: Bilateral upper extremity supported, Reliant on assistive device for balance, During functional activity Standing balance-Leahy Scale: Poor Standing balance comment: RW in standing                           ADL either performed or assessed with clinical judgement   ADL Overall ADL's : Needs assistance/impaired Eating/Feeding: Set up;Sitting Eating/Feeding Details (indicate cue type and reason): assist to open containers Grooming: Contact guard assist;Minimal assistance;Standing   Upper Body Bathing: Set up;Sitting;Cueing for sequencing;Supervision/ safety   Lower Body Bathing: Minimal assistance;Sit to/from stand   Upper Body Dressing : Supervision/safety;Sitting;Set up   Lower Body Dressing: Minimal assistance;Sit to/from stand Lower Body Dressing Details (indicate cue type and reason): donning underpants Toilet Transfer: Contact guard assist;Rolling walker (2 wheels);Ambulation           Functional mobility during ADLs: Minimal assistance;Contact guard assist;Rolling walker (2 wheels)       Vision Baseline Vision/History: 1 Wears glasses Ability to See in Adequate Light: 0 Adequate Patient Visual Report: No change from baseline Additional Comments: not formally assessed     Perception         Praxis         Pertinent Vitals/Pain Pain Assessment Pain Assessment: No/denies pain     Extremity/Trunk  Assessment Upper Extremity Assessment Upper Extremity Assessment: Generalized weakness   Lower Extremity  Assessment Lower Extremity Assessment: Generalized weakness   Cervical / Trunk Assessment Cervical / Trunk Assessment: Normal   Communication Communication Communication: No apparent difficulties Cueing Techniques: Verbal cues;Gestural cues;Tactile cues   Cognition Arousal: Lethargic Behavior During Therapy: Flat affect Overall Cognitive Status: Impaired/Different from baseline Area of Impairment: Memory, Orientation, Attention, Following commands, Safety/judgement, Awareness, Problem solving                 Orientation Level: Disoriented to, Place, Time, Situation Current Attention Level: Focused Memory: Decreased short-term memory Following Commands: Follows one step commands inconsistently, Follows one step commands with increased time Safety/Judgement: Decreased awareness of safety, Decreased awareness of deficits Awareness: Intellectual, Emergent Problem Solving: Slow processing, Requires verbal cues General Comments: slow processing, poor attention, needing cues to attend to tasks esp cognitive testing. Scored a 22 on the short blessed test of cognition with difficulty reporting year, counting backward, stating months in reverse order, and delayed memory recall     General Comments  vss    Exercises     Shoulder Instructions      Home Living Family/patient expects to be discharged to:: Private residence Living Arrangements: Alone Available Help at Discharge: Personal care attendant Type of Home: Apartment Home Access: Level entry     Home Layout: One level     Bathroom Shower/Tub: Chief Strategy Officer: Standard     Home Equipment: None   Additional Comments: PCA M-F for 2 hours, pt with confusion and home setup not the same as last admission so unsure of accuracy      Prior Functioning/Environment Prior Level of Function : Independent/Modified Independent             Mobility Comments: walking without AD per pt ADLs Comments: per  pt, aide 7 days/week for 3 hours each day who helps with bathing, dressing, grocery shopping, meal prep. Per pt report, aide does not assist with medication management or set up. Pt receives medicaid transport for appointments. Some discrepancies between report to OT and PT        OT Problem List: Decreased strength;Decreased activity tolerance;Impaired balance (sitting and/or standing);Decreased cognition;Decreased safety awareness;Decreased knowledge of use of DME or AE      OT Treatment/Interventions: Self-care/ADL training;Therapeutic exercise;DME and/or AE instruction;Balance training;Patient/family education;Therapeutic activities    OT Goals(Current goals can be found in the care plan section) Acute Rehab OT Goals Patient Stated Goal: go home OT Goal Formulation: With patient Time For Goal Achievement: 06/05/23 Potential to Achieve Goals: Good ADL Goals Pt Will Perform Grooming: with supervision;standing Pt Will Perform Upper Body Dressing: with modified independence;sitting Pt Will Perform Lower Body Dressing: with supervision;sit to/from stand Pt Will Transfer to Toilet: with supervision;ambulating Additional ADL Goal #1: Pt will follow two step commands during ADL Additional ADL Goal #2: Pt will perform pillbox test with 3 or less errors.  OT Frequency: Min 1X/week    Co-evaluation              AM-PAC OT "6 Clicks" Daily Activity     Outcome Measure Help from another person eating meals?: A Little Help from another person taking care of personal grooming?: A Little Help from another person toileting, which includes using toliet, bedpan, or urinal?: A Little Help from another person bathing (including washing, rinsing, drying)?: A Little Help from another person to put on and taking off regular upper body clothing?: A Little Help  from another person to put on and taking off regular lower body clothing?: A Little 6 Click Score: 18   End of Session Equipment Utilized  During Treatment: Gait belt;Rolling walker (2 wheels) Nurse Communication: Mobility status  Activity Tolerance: Patient tolerated treatment well Patient left: in chair;with call bell/phone within reach  OT Visit Diagnosis: Unsteadiness on feet (R26.81);Muscle weakness (generalized) (M62.81);Other symptoms and signs involving cognitive function                Time: 1610-9604 OT Time Calculation (min): 21 min Charges:  OT General Charges $OT Visit: 1 Visit OT Evaluation $OT Eval Moderate Complexity: 1 Mod  Tyler Deis, OTR/L [facility] Acute Rehabilitation Office: [phone]   Myrla Halsted 05/22/2023, 12:49 PM

## 2023-05-22 NOTE — Progress Notes (Signed)
Pt goal not met d/t pt cut off 15 mins. She doesn't want to complete the treatment. Dr. Valentino Nose will notified.   05/22/23 1939  Vitals  Temp 98.5 F (36.9 C)  Temp Source Oral  BP (!) 142/73  BP Location Left Arm  BP Method Automatic  Patient Position (if appropriate) Lying  Pulse Rate 76  Resp 20  Oxygen Therapy  SpO2 97 %  O2 Device Room Air  During Treatment Monitoring  Intra-Hemodialysis Comments See progress note  Post Treatment  Dialyzer Clearance Clotted  Hemodialysis Intake (mL) 0 mL  Liters Processed 72  Fluid Removed (mL) 2000 mL  Tolerated HD Treatment Yes  Post-Hemodialysis Comments Pt goal not met d/t cut off 15 mins.  Hemodialysis Catheter Right Subclavian  No placement date or time found.   Placed prior to admission: Yes  Local Anesthetic: None  Ultrasound Used?: No  Orientation: Right  Access Location: Subclavian  Site Condition No complications  Blue Lumen Status Heparin locked  Red Lumen Status Heparin locked  Catheter fill solution Heparin 1000 units/ml  Catheter fill volume (Arterial) 1.6 cc  Catheter fill volume (Venous) 1.6  Dressing Type Gauze/Drain sponge;Transparent  Dressing Status Antimicrobial disc in place;Clean, Dry, Intact  Interventions New dressing  Drainage Description None  Dressing Change Due 05/27/23  Post treatment catheter status Capped and Clamped

## 2023-05-22 NOTE — Progress Notes (Signed)
Speech Language Pathology Treatment: Dysphagia  Patient Details Name: Charne Quamme MRN: [id number] DOB: 1956-05-29 Today's Date: 05/22/2023 Time: 0840-0900 SLP Time Calculation (min) (ACUTE ONLY): 20 min  Assessment / Plan / Recommendation Clinical Impression  Pt seen for dysphagia tx/swallow re-assessment/po readiness trial with improved overall mentation/alertness level and pt eager to consume food/liquid.  Oriented to all but situation.  Pt denies dysphagia at baseline.  OME unremarkable, but missing dentition impacted overall mastication with prolonged oral manipulation observed.  Otherwise, pt consumed thin liquids via straw/cup, ice chips, puree and soft solids with a timely swallow and adequate oropharyngeal clearance noted.  No overt s/sx of aspiration present during this trial.  Recommend initiating a Dysphagia 3(chopped)/thin liquid diet with ST f/u briefly for diet tolerance with a meal and education re: safety with swallowing during acute stay.    HPI HPI: Eyanna Darga is a 67 yo female presenting to ED 11/8 with AMS. CXR and CTH negative. PMH includes ESRD on HD, HTN, anxiety, bipolar disorders, h/o of substance abuse . ST completed BSE on 11/8 with NPO status recommended; ST f/u for PO readiness      SLP Plan  Goals updated      Recommendations for follow up therapy are one component of a multi-disciplinary discharge planning process, led by the attending physician.  Recommendations may be updated based on patient status, additional functional criteria and insurance authorization.    Recommendations  Diet recommendations: Dysphagia 3 (mechanical soft);Thin liquid Liquids provided via: Cup;Straw Medication Administration: Whole meds with liquid Supervision: Patient able to self feed;Intermittent supervision to cue for compensatory strategies Compensations: Slow rate;Small sips/bites Postural Changes and/or Swallow Maneuvers: Seated upright 90 degrees                   Oral care BID   Intermittent Supervision/Assistance Dysphagia, unspecified (R13.10)     Goals updated     Pat Harlin Mazzoni,M.S.,CCC-SLP  05/22/2023, 9:13 AM

## 2023-05-22 NOTE — Progress Notes (Addendum)
HD#0 SUBJECTIVE:  Patient Summary: Sherry [name] is a 67 y.o. person living with a history of ESRD,GERD, Polysubstance use  who presented with altered mental status and admitted for Acute encephalopathy likely due to increased medication ingestion in the setting of poor renal function.  Overnight Events: Patient had HD  Interim History: Patient seen at bedside, she was sleeping in bed comfortably and non oxygen requiring. She awakes to sternal rub but falls back to sleep. Could get much from her this morning.   OBJECTIVE:  Vital Signs: Vitals:   05/22/23 0121 05/22/23 0200 05/22/23 0400 05/22/23 0454  BP: (!) 152/87 (!) 143/80 (!) 163/78 (!) 164/80  Pulse: 81 80 82   Resp: 18 16 16    Temp: 97.9 F (36.6 C)   97.9 F (36.6 C)  TempSrc: Axillary   Axillary  SpO2: 94% 95% 95%   Weight:    72.2 kg  Height:       Supplemental O2: Room Air SpO2: 95 %  Filed Weights   05/21/23 1003 05/21/23 1004 05/22/23 0454  Weight: 68 kg 68 kg 72.2 kg     Intake/Output Summary (Last 24 hours) at 05/22/2023 6213 Last data filed at 05/22/2023 0457 Gross per 24 hour  Intake 0 ml  Output 4000 ml  Net -4000 ml   Net IO Since Admission: -4,000 mL [05/22/23 0620]  Physical Exam: Physical Exam Constitutional:      General: She is not in acute distress.    Appearance: She is ill-appearing. She is not diaphoretic.     Comments: Somnolent, arouse to sternal rub  HENT:     Head: Normocephalic and atraumatic.  Pulmonary:     Effort: Pulmonary effort is normal.  Skin:    General: Skin is warm and dry.  Neurological:     Comments: Unable to assess     Patient Lines/Drains/Airways Status     Active Line/Drains/Airways     Name Placement date Placement time Site Days   Peripheral IV 05/21/23 22 G Left;Posterior Hand 05/21/23  1057  Hand  1   Peripheral IV 05/21/23 20 G 1.88" Anterior;Left Forearm 05/21/23  1236  Forearm  1   Fistula / Graft Right Forearm Arteriovenous fistula 05/01/21   0844  Forearm  751   Fistula / Graft Right Upper arm 09/02/21  0945  Upper arm  627   Fistula / Graft Right Upper arm 01/30/22  --  Upper arm  477   Hemodialysis Catheter Right Subclavian --  --  Subclavian  --   External Urinary Catheter 05/21/23  1810  --  1             ASSESSMENT/PLAN:  Assessment: Principal Problem:   Acute encephalopathy   Plan: #Acute Encephalopathy  #Polypharmacy (prescribed tramadol, diazepam, and gabapentin) # History of polysubstance abuse (alcohol, opioids, crack cocaine per chart review) #ESRD Patient had dialysis overnight but there hasn't been any much improvement clinical improvement, low suspicion for uremic induced encephalopathy at this time. Due to the numerous home medications she is on off which most are centrally acting, highly suspect her change in mental status is  drug-induced secondary to polypharmacy. Nephrology is following - Repeat dialysis today to see if additional session could help with drug clearance   #HTN BP still elevated at 164/80 despite HD yesterday. Will continue to follow up and measure BP again after dialysis. Continue amlodipine.   #Anemia of CKD Chronic. Hgb stable at . Ferritin elevated at 688. Iron and TIBC  WNL of 36 and 260 respectively. Pt is due for erythropoietin stimulating agents on November 11 th. Will continue to trend CBC for now   #Hx of anxiety #Hx of bipolar Disorder - Continuing to holding all centrally acting medication. Will consider is she needs to be restarted on her benzodiazepine, monitor for benzo withdrawal    Best Practice: Diet: NPO IVF: N/A VTE: heparin injection 5,000 Units Start: 05/21/23 1400 Code: Full DISPO: Anticipated discharge tomorrow to Home pending  MEDICATION WORK UP AND TREATMENT  .  Signature: Kathleen Lime , MD Internal Medicine Resident, PGY-1 [facility]  Pager: [phone] 6:20 AM, 05/22/2023   Please contact the on call pager after  5 pm and on weekends at [phone].

## 2023-05-22 NOTE — Procedures (Signed)
HD Note:  Some information was entered later than the data was gathered due to patient care needs. The stated time with the data is accurate.  Received patient in bed to unit.   Patient difficult to awaken when arrived on the unit  Informed consent signed and in chart.   Access used: Upper right chest HD catheter Access issues: Lines had to be reversed, then functioned well  Patient became more alert in the first hour of treatment.  Confused and irritated about not being able to eat or be "given something to sleep"  Report given to oncoming dialysis nurse  Vikki Ports L. Dareen Piano, RN [facility].

## 2023-05-23 LAB — RENAL FUNCTION PANEL
Albumin: 2.8 g/dL — ABNORMAL LOW (ref 3.5–5.0)
Anion gap: 8 (ref 5–15)
BUN: 21 mg/dL (ref 8–23)
CO2: 28 mmol/L (ref 22–32)
Calcium: 9 mg/dL (ref 8.9–10.3)
Chloride: 100 mmol/L (ref 98–111)
Creatinine, Ser: 2.66 mg/dL — ABNORMAL HIGH (ref 0.44–1.00)
GFR, Estimated: 19 mL/min — ABNORMAL LOW (ref >60)
Glucose, Bld: 118 mg/dL — ABNORMAL HIGH (ref 70–99)
Phosphorus: 4.1 mg/dL (ref 2.5–4.6)
Potassium: 3.3 mmol/L — ABNORMAL LOW (ref 3.5–5.1)
Sodium: 136 mmol/L (ref 135–145)

## 2023-05-23 LAB — CBC
HCT: 25.5 % — ABNORMAL LOW (ref 36.0–46.0)
Hemoglobin: 7.8 g/dL — ABNORMAL LOW (ref 12.0–15.0)
MCH: 27.3 pg (ref 26.0–34.0)
MCHC: 30.6 g/dL (ref 30.0–36.0)
MCV: 89.2 fL (ref 80.0–100.0)
Platelets: 157 10*3/uL (ref 150–400)
RBC: 2.86 MIL/uL — ABNORMAL LOW (ref 3.87–5.11)
RDW: 15.5 % (ref 11.5–15.5)
WBC: 4 10*3/uL (ref 4.0–10.5)
nRBC: 0 % (ref 0.0–0.2)

## 2023-05-23 MED ORDER — POTASSIUM CHLORIDE 20 MEQ PO PACK: 20.0000 meq | PACK | Freq: Two times a day (BID) | ORAL | Status: DC

## 2023-05-23 MED ORDER — TRAMADOL HCL 50 MG PO TABS
50.0000 mg | ORAL_TABLET | Freq: Once | ORAL | Status: AC
  Administered 2023-05-23: 50 mg via ORAL
  Filled 2023-05-23: qty 1

## 2023-05-23 MED ORDER — GABAPENTIN 100 MG PO CAPS: 100.0000 mg | ORAL_CAPSULE | Freq: Three times a day (TID) | ORAL | 0 refills | Status: DC

## 2023-05-23 MED ORDER — DARBEPOETIN ALFA 150 MCG/0.3ML IJ SOSY: 150.0000 ug | PREFILLED_SYRINGE | INTRAMUSCULAR | Status: DC

## 2023-05-23 MED ORDER — TRAMADOL HCL 50 MG PO TABS: 50.0000 mg | ORAL_TABLET | Freq: Three times a day (TID) | ORAL | Status: DC | PRN

## 2023-05-23 MED ORDER — POTASSIUM CHLORIDE 20 MEQ PO PACK
20.0000 meq | PACK | Freq: Two times a day (BID) | ORAL | Status: AC
  Administered 2023-05-23: 20 meq via ORAL
  Filled 2023-05-23: qty 1

## 2023-05-23 NOTE — TOC Transition Note (Addendum)
Transition of Care [facility]) - CM/SW Discharge Note   Patient Details  Name: Sherry [name] MRN: [id number] Date of Birth: 1956/07/12  [facility] CM/SW Contact:  Ronny Bacon, RN Phone Number: 05/23/2023, 9:31 AM   Clinical Narrative:   Secure message received from provider that patient will be discharged today and needs [facility] services and RW. Spoke with patient at bedside, Patient does not feel she is ready to be discharged home due to having hip and low back pain. Patient reports that she lives home alone. Message sent to floor nurse to see if patient has anything ordered for pain.  Discussed HH services, Patient does not have preference for [facility] agency and confirms that she has a Rollator at home. Cory with [facility] able [facility] PT/OT. Provider made aware of patient concerns of going home today. Patient will need transportation home.  1110- PTAR arranged, needed forms sent to unit. Floor nurse aware.  1205- secure message received from floor nurse regarding PTAR staff request to remove high fall risk and altered mental status from Medical necessity form. Used patient diagnosis of Encephalopathy for Medical Necessity form.   Final next level of care: Home w Home Health Services Barriers to Discharge: Other (must enter comment) (Patient reports that she does not feel like she is ready for discharge. Patient having pain to hip and low back)   Patient Goals and CMS Choice      Discharge Placement                         Discharge Plan and Services Additional resources added to the After Visit Summary for                                       Social Determinants of Health (SDOH) Interventions SDOH Screenings   Food Insecurity: No Food Insecurity (05/22/2023)  Housing: Low Risk  (05/22/2023)  Transportation Needs: No Transportation Needs (05/22/2023)  Utilities: Unknown (11/04/2022)   [facility]  Alcohol Screen:  Medium Risk (08/27/2017)  Depression (PHQ2-9): Medium Risk (12/20/2020)  Tobacco Use: High Risk (05/21/2023)     Readmission Risk Interventions     No data to display

## 2023-05-23 NOTE — Discharge Planning (Signed)
[facility] Patient Discharge Orders- [facility] CLINIC: SW  Patient's name: Sherry [name] Admit/DC Dates: 05/21/2023 - 05/23/2023  Discharge Diagnoses: Acute encephalopathy likely d/t polypharmacy - gabapentin reduced to 100mg  TID   Aranesp: Given: no    Last Hgb: 7.8 PRBC's Given: no ESA dose for discharge: no change IV Iron dose at discharge: no change  Heparin change: no  EDW Change: no   Bath Change: no  Access intervention/Change: no Details:  Hectorol/Calcitriol change: no  Discharge Labs: Calcium 9.0 Phosphorus 4.1 Albumin 2.8 K+ 3.3  Start ONSP if not already started.   IV Antibiotics: no Details:  On Coumadin?: no Last INR: Next INR: Managed By:   OTHER/APPTS/LAB ORDERS:    D/C Meds to be reconciled by nurse after every discharge.  Completed By: Virgina Norfolk, PA-C   Reviewed by: MD:______ RN_______

## 2023-05-23 NOTE — Progress Notes (Signed)
Patient ID: Sherry [name], female   DOB: [birth year], 67 y.o.   MRN: [id number] [facility] Progress Note   Assessment/ Plan:   Altered mental status- etiology unclear but suspected to be largely from the sedative effect of preceding use of muscle relaxant, pain medication and gabapentin.  Unfortunately without significant improvement after 2 sequential dialysis treatments to help promote clearance (although she did get diazepam yesterday afternoon and melatonin last night).  Unlikely to be uremic based on lack of meaningful improvement over the past 48 hours.  Clinical picture not indicative of infectious process and head CT from 11/8 negative.  ESRD -usually on a Monday/Wednesday/Friday schedule and underwent hemodialysis yesterday to assess the impact on mental status by promoting (dialyzable) drug clearance.  Unfortunately, remains largely somnolent/encephalopathic when seen this morning.  Will order for additional dialysis again tomorrow to resume outpatient schedule.  Hypertension/volume  - Blood pressure elevated without evidence of gross volume overload, will continue dialysis on MWF schedule.  Anemia of CKD -hemoglobin/hematocrit trending down without overt blood loss, will order for ESA tomorrow.  Secondary Hyperparathyroidism -calcium at goal with elevated phosphorus level, mental status prohibitive to diet/phosphorus binder. Hypokalemia: Secondary to limited intake and dialysis associated losses.  Will give a single dose of oral potassium replacement.  Subjective:   Underwent hemodialysis yesterday.  Awakens to loud voice/painful stimuli with monosyllabic answers   Objective:   BP (!) 154/71   Pulse 69   Temp 98 F (36.7 C) (Axillary)   Resp 16   Ht 5\' 2"  (1.575 m)   Wt 72 kg   SpO2 98%   BMI 29.04 kg/m   Physical Exam: Gen: Somnolent, awakens to loud voice/painful stimulus CVS: Pulse regular rhythm, normal rate, S1 and S2 normal.  Right IJ TDC in place Resp:  Coarse/transmitted breath sounds bilaterally without distinct rales or rhonchi Abd: Soft, obese, nontender, bowel sounds normal Ext: Trace right leg edema without left leg edema.  Labs: BMET Recent Labs  Lab 05/21/23 1038 05/21/23 1054 05/21/23 1412 05/21/23 2100 05/22/23 0704 05/23/23 0236  NA 136  137 143 142 142 135 136  K 6.0*  6.0* 5.8* 5.6* 4.9 3.8 3.3*  CL 111 104 106 107 98 100  CO2  --  25 25 24 24 28   GLUCOSE 87 82 81 68* 65* 118*  BUN 68* 59* 57* 57* 27* 21  CREATININE 5.80* 5.46* 5.50* 5.65* 3.45* 2.66*  CALCIUM  --  9.4 9.2 8.9 9.1 9.0  PHOS  --   --  7.0* 6.8* 4.7* 4.1   CBC Recent Labs  Lab 05/21/23 1054 05/21/23 2100 05/22/23 0704 05/23/23 0236  WBC 6.3 5.5 3.8* 4.0  NEUTROABS 4.7  --   --   --   HGB 8.6* 7.5* 8.4* 7.8*  HCT 29.5* 25.6* 27.5* 25.5*  MCV 95.8 94.5 90.8 89.2  PLT 217 188 178 157      Medications:     amLODipine  10 mg Oral Daily   Chlorhexidine Gluconate Cloth  6 each Topical Q0600   diazepam  10 mg Oral Daily   heparin  5,000 Units Subcutaneous Q8H   melatonin  5 mg Oral QHS   nicotine  7 mg Transdermal Daily   potassium chloride  20 mEq Oral BID   sodium chloride flush  3 mL Intravenous Q12H   Zetta Bills, MD 05/23/2023, 8:28 AM

## 2023-05-23 NOTE — Plan of Care (Signed)
  Problem: Clinical Measurements: Goal: Ability to maintain clinical measurements within normal limits will improve Outcome: Progressing Goal: Will remain free from infection Outcome: Progressing Goal: Diagnostic test results will improve Outcome: Progressing Goal: Respiratory complications will improve Outcome: Progressing Goal: Cardiovascular complication will be avoided Outcome: Progressing   Problem: Activity: Goal: Risk for activity intolerance will decrease Outcome: Progressing   Problem: Elimination: Goal: Will not experience complications related to bowel motility Outcome: Progressing   

## 2023-05-23 NOTE — Discharge Summary (Signed)
Name: Sherry [name] MRN: [id number] DOB: 1955/08/04 67 y.o. PCP: Pcp, No  Date of Admission: 05/21/2023  9:51 AM Date of Discharge: 05/23/2023 12:31 PM Attending Physician: Dr. Antony Contras  Discharge Diagnosis: Principal Problem:   Acute encephalopathy Active Problems:   Depression with anxiety   Opioid dependence (HCC)   ESRD on dialysis (HCC)   Benzodiazepine dependence (HCC)   Polypharmacy   History of substance abuse (HCC)    Discharge Medications: Allergies as of 05/23/2023       Reactions   Abilify [aripiprazole] Other (See Comments)   Tardive dyskinesia Oral   Remeron [mirtazapine] Other (See Comments)   Wgt stimulation /gain, Dizziness, Patient says "can tolerate"   Trazodone And Nefazodone Other (See Comments)   Nightmares/sleep diturbance   Flexeril [cyclobenzaprine] Other (See Comments)   Pt states Flexeril makes her feel depressed    Amoxicillin Diarrhea, Other (See Comments)   NOTE the patient has had PCN WITHOUT reaction Has patient had a PCN reaction causing immediate rash, facial/tongue/throat swelling, SOB or lightheadedness with hypotension: No Has patient had a PCN reaction causing severe rash involving mucus membranes or skin necrosis: No Has patient had a PCN reaction that required hospitalization: No Has patient had a PCN reaction occurring within the last 10 years: No If all of the above answers are "NO", then may proceed with Cephalosporin use.        Medication List     STOP taking these medications    labetalol 200 MG tablet Commonly known as: NORMODYNE   lidocaine 5 % Commonly known as: Lidoderm   Melatonin 10 MG Tabs   methocarbamol 500 MG tablet Commonly known as: ROBAXIN   oxyCODONE 5 MG immediate release tablet Commonly known as: Roxicodone   sertraline 25 MG tablet Commonly known as: ZOLOFT   zolpidem 5 MG tablet Commonly known as: AMBIEN       TAKE these medications    amLODipine 10 MG tablet Commonly known as:  NORVASC Take 1 tablet by mouth daily.   carvedilol 12.5 MG tablet Commonly known as: COREG Take 25 mg by mouth 2 (two) times daily.   diazepam 10 MG tablet Commonly known as: VALIUM Take 10 mg by mouth as directed.   diclofenac Sodium 1 % Gel Commonly known as: VOLTAREN Apply 4 g topically 4 (four) times daily.   FLUoxetine 40 MG capsule Commonly known as: PROZAC Take 40 mg by mouth in the morning.   gabapentin 100 MG capsule Commonly known as: NEURONTIN Take 1 capsule (100 mg total) by mouth 3 (three) times daily. What changed:  medication strength how much to take when to take this   hydrALAZINE 50 MG tablet Commonly known as: APRESOLINE Take 50 mg by mouth 3 (three) times daily.   Ingrezza 80 MG capsule Generic drug: valbenazine Take 80 mg by mouth at bedtime.   isosorbide dinitrate 20 MG tablet Commonly known as: ISORDIL Take 20 mg by mouth 3 (three) times daily.   lidocaine-prilocaine cream Commonly known as: EMLA Apply 1 Application topically once.   nitroGLYCERIN 0.4 MG SL tablet Commonly known as: NITROSTAT Place 0.4 mg under the tongue every 5 (five) minutes as needed for chest pain.   pantoprazole 40 MG tablet Commonly known as: Protonix Take 1 tablet (40 mg total) by mouth daily.   senna-docusate 8.6-50 MG tablet Commonly known as: Senokot-S Take 1 tablet by mouth at bedtime as needed for mild constipation.   traMADol 50 MG tablet Commonly known as: ULTRAM Take  50-100 mg by mouth 3 (three) times daily as needed for severe pain.        Disposition and follow-up:   Sherry [name] was discharged from [facility] in [place] condition.  At the hospital follow up visit please address:  1.  Follow-up: *Pain management Bilateral hip and lower back pain *Metabolic encephalopathy *Polypharmacy and hx of polysubstance abuse -Ensure medication reconciliation at discharge -Recommend slow taper of diazepam and toradol, kept same at  dischagre -Assess pain control as Gabapentin was decreased to 100 mg TID from 300 mg TID -Recommend referral to outpatient CBT and psychiatry at follow up -Recommend repeat sleep studies as outpatient to assess for OSA or CPAP needs  *ESRD on HD MWD -Ensure patient has transportation for Monday dialysis -Renally dose medications at discharge -Monitor RFP  *Functional deconditioning  -Ensure HH PT and OT at discharge  2.  Labs / imaging needed at time of follow-up: RFP, CBC  3.  Pending labs/ test needing follow-up: repeat sleep study  4.  There is confusion about the medications this patient is currently receiving: Please see below the medication found through chart review, which is not reflected in the EMR on medication reconciliation   Prior to admision medications: Amlodipine 10 mg daily Hydralazine 100 mg TID Imdur 120 mg daily Carvedilol 12.5   Per chart review: Prozac  Melatonin 10 mg Protonix 40 mg daily   Per PDMP review Anselm Lis, MD prescribed patient: - Diazepam 10 mg daily - Tramadol 50 mg - last refill for 180 pills for 30 days - Gabapentin 300 mg TID for 30 days Belcuca - 600 mcg film, Belbuca 750 mcg film - last fill for this was 07/2022  Follow-up Appointments:  Follow-up Information     [facility] Follow up.   Specialty: Home Health Services Why: Physical and Occupational therapy. Office will call to follow up after discharge. Contact information: [address] [place] [address] [phone]                 Hospital Course by problem list:  #Acute Encephalopathy  #Polypharmacy (prescribed tramadol, diazepam, and gabapentin) # History of polysubstance abuse (alcohol, opioids, crack cocaine per chart review) Patient was brought in by EMS per family's request due to concerns of altered mental status. She is on multiple centrally acting, renally cleared medications and has prior history of  polysubstance abuse.  Patient had missed 2 sessions of HD prior to this admission. UDS positive for benzodiazepines.  Nephrology was consulted during this admission; after 2 sessions of HD, patient returned to her baseline. Patient denied SI or other medication use other than prescribed medications by her PCP Dr. Malen Gauze. She monitored overnight after restarted diazepam 10 mg daily. On HOD2, her initial exam was consistent with severe somnolence, but on re-evaluation 2 hours later, patient back to her baseline. She had a sleep study done in 2013 without recommendations for PAP therapy, however, STOP BANG score here 6. For now, will continue diazepam and Tramadol. Will decrease Gabapentin from 300 mg TID to 100 TID. Will need close monitoring in the outpatient setting. Recommend down-titration of centrally acting medications and renally cleared medications in the outpatient setting. Patient initially did not feel comfortable with discharge as she requests opiate medications. Reviewed PDMD and share medication dispense history with patient, and she was able to confirm Tramadol, Gabapentin, and Diazepam medications. Offered she could stay as inpatient with other methods of pain control without  opiates given her initial presentation and she declined. Physical therapy evaluated patient and deemed home health PT at this time. Discussed with case management who reported insurance is unable to cover [facility] aide costs. Patient expressed understanding and will be following up with her primary care doctor at discharge. [facility] transportation offered at dischage.   #ESRD on HD Over dry weight at admission. S/p 2 HD sessions during this admission. Patient to continue HD at discharge on MWF schedule. Nephrology attending alerted of discharge to home today.  #HTN Hypertensive on admission. Discharged on home antihypertensives. Will need monitoring at follow up at outpatient HD and with PCP.  #Anemia secondary to CKD Chronic. Hgb  stable.  Managed at dialysis. Pt is due for erythropoietin stimulating agents on November 11th.   #Hx of anxiety #Hx of bipolar Disorder Continued on Prozac and diazepam. Recommend psychiatry and CBT referral in the outpatient setting.  Discharge Subjective: Patient did not remember what brought her to the hospital. Feels back to her baseline this morning. Reviewed all her medications and discussed the need for careful monitoring of tramadol and gabapentin at discharge. She is aware of the decrease in gabapentin dose. She tells me she snores and awakens in the middle of night. She had a sleep study in the past but did not think she needed a PAP machine.   Patient confirms has medications at home; will pick up changes to Gabapentin from preferred pharmacy. Has assistance to go to HD on Monday. Will call to make appointment with Dr. Malen Gauze at [facility]. Rn present for this interview.  She is currently in bilateral hip discomfort. One dose of toradol requested before transport back to her home in [place].  Discharge Exam:   Blood pressure (!) 156/56, pulse 76, temperature 97.8 F (36.6 C), temperature source Oral, resp. rate 20, height 5\' 2"  (1.575 m), weight 72 kg, SpO2 97%.  Constitutional: Chronically ill-appearing woman, alert, sitting in chair, eating breakfast in NAD HENT: normocephalic atraumatic, mucous membranes moist, edentulous  Cardiovascular: regular rate and rhythm, no m/r/g Pulmonary/Chest: normal work of breathing on room air, lungs clear to auscultation bilaterally. No crackles  Abdominal: soft, non-tender, non-distended.  Neurological: alert & oriented x 3 MSK: no gross abnormalities. No pitting edema Skin: warm and dry Psych: Normal mood and affect  Pertinent Labs, Studies, and Procedures:     Latest Ref Rng & Units 05/23/2023    2:36 AM 05/22/2023    7:04 AM 05/21/2023    9:00 PM  CBC  WBC 4.0 - 10.5 K/uL 4.0  3.8  5.5   Hemoglobin 12.0 - 15.0 g/dL 7.8  8.4   7.5   Hematocrit 36.0 - 46.0 % 25.5  27.5  25.6   Platelets 150 - 400 K/uL 157  178  188        Latest Ref Rng & Units 05/23/2023    2:36 AM 05/22/2023    7:04 AM 05/21/2023    9:00 PM  CMP  Glucose 70 - 99 mg/dL 010  65  68   BUN 8 - 23 mg/dL 21  27  57   Creatinine 0.44 - 1.00 mg/dL 2.72  5.36  6.44   Sodium 135 - 145 mmol/L 136  135  142   Potassium 3.5 - 5.1 mmol/L 3.3  3.8  4.9   Chloride 98 - 111 mmol/L 100  98  107   CO2 22 - 32 mmol/L 28  24  24    Calcium 8.9 - 10.3  mg/dL 9.0  9.1  8.9     DG Pelvis 1-2 Views  Result Date: 05/21/2023 CLINICAL DATA:  67 year old female with altered mental status. End stage renal disease. Possible fall. EXAM: PELVIS - 1-2 VIEW COMPARISON:  CT Abdomen and Pelvis 04/23/2023. FINDINGS: Portable AP supine view at 1057 hours. Chronic severe asymmetric right hip joint degeneration with subchondral sclerosis and cysts. Stable bone mineralization from the CT last month. Femoral heads remain located. Pelvis appears stable and intact. Superimposed chronic lower lumbar spine fusion. Superimposed ventral abdominal hernia repair with mesh. Nonobstructed bowel-gas pattern. Calcified iliofemoral atherosclerosis. IMPRESSION: 1. No acute fracture or dislocation identified about the pelvis. 2. Severe chronic right hip degeneration. Electronically Signed   By: Odessa Fleming M.D.   On: 05/21/2023 12:59   DG Chest Portable 1 View  Result Date: 05/21/2023 CLINICAL DATA:  67 year old female with altered mental status. End stage renal disease. EXAM: PORTABLE CHEST 1 VIEW COMPARISON:  Portable chest 05/16/2023 and earlier. FINDINGS: Portable AP semi upright view at 1036 hours. Mildly more rotated to the left. Cardiomegaly not significantly changed. Calcified aortic atherosclerosis. Visualized tracheal air column is within normal limits. Stable right chest dual lumen dialysis type catheter. Allowing for portable technique the lungs are clear. No pneumothorax or pleural effusion.  Partially visible cervical ACDF. No acute osseous abnormality identified. Paucity of bowel gas. IMPRESSION: 1. No acute cardiopulmonary abnormality. 2. Stable cardiomegaly, right chest dialysis catheter. Electronically Signed   By: Odessa Fleming M.D.   On: 05/21/2023 12:57   CT Head Wo Contrast  Result Date: 05/21/2023 CLINICAL DATA:  Polytrauma, blunt EXAM: CT HEAD WITHOUT CONTRAST CT CERVICAL SPINE WITHOUT CONTRAST TECHNIQUE: Multidetector CT imaging of the head and cervical spine was performed following the standard protocol without intravenous contrast. Multiplanar CT image reconstructions of the cervical spine were also generated. RADIATION DOSE REDUCTION: This exam was performed according to the departmental dose-optimization program which includes automated exposure control, adjustment of the mA and/or kV according to patient size and/or use of iterative reconstruction technique. COMPARISON:  CT C Spine 01/25/23 FINDINGS: CT HEAD FINDINGS Brain: No evidence of acute infarction, hemorrhage, hydrocephalus, extra-axial collection or mass lesion/mass effect. There is sequela of mild chronic microvascular ischemic change. Vascular: No hyperdense vessel or unexpected calcification. Skull: Normal. Negative for fracture or focal lesion. Sinuses/Orbits: Middle ear or mastoid effusion. Paranasal sinuses are clear. Orbits are unremarkable. Other: None. CT CERVICAL SPINE FINDINGS Alignment: Straightening of the normal cervical lordosis. Skull base and vertebrae: Postsurgical changes from C4-C6 ACDF. There is solid osseous fusion at C4-C5. The fusion screw on the left at the C5 level is fractured and slightly retracted, unchanged from prior. No evidence of perihardware lucency. Soft tissues and spinal canal: No prevertebral fluid or swelling. No visible canal hematoma. Disc levels:  No evidence high-grade spinal canal stenosis. Upper chest: Aortic atherosclerotic calcifications. Other: None IMPRESSION: 1. No acute  intracranial abnormality. 2. No acute fracture or traumatic subluxation of the cervical spine. 3. Postsurgical changes from C4-C6 ACDF. The fusion screw on the left at the C5 level is fractured and slightly retracted, unchanged from prior. Aortic Atherosclerosis (ICD10-I70.0). Electronically Signed   By: Lorenza Cambridge M.D.   On: 05/21/2023 11:41   CT Cervical Spine Wo Contrast  Result Date: 05/21/2023 CLINICAL DATA:  Polytrauma, blunt EXAM: CT HEAD WITHOUT CONTRAST CT CERVICAL SPINE WITHOUT CONTRAST TECHNIQUE: Multidetector CT imaging of the head and cervical spine was performed following the standard protocol without intravenous contrast. Multiplanar CT image reconstructions  of the cervical spine were also generated. RADIATION DOSE REDUCTION: This exam was performed according to the departmental dose-optimization program which includes automated exposure control, adjustment of the mA and/or kV according to patient size and/or use of iterative reconstruction technique. COMPARISON:  CT C Spine 01/25/23 FINDINGS: CT HEAD FINDINGS Brain: No evidence of acute infarction, hemorrhage, hydrocephalus, extra-axial collection or mass lesion/mass effect. There is sequela of mild chronic microvascular ischemic change. Vascular: No hyperdense vessel or unexpected calcification. Skull: Normal. Negative for fracture or focal lesion. Sinuses/Orbits: Middle ear or mastoid effusion. Paranasal sinuses are clear. Orbits are unremarkable. Other: None. CT CERVICAL SPINE FINDINGS Alignment: Straightening of the normal cervical lordosis. Skull base and vertebrae: Postsurgical changes from C4-C6 ACDF. There is solid osseous fusion at C4-C5. The fusion screw on the left at the C5 level is fractured and slightly retracted, unchanged from prior. No evidence of perihardware lucency. Soft tissues and spinal canal: No prevertebral fluid or swelling. No visible canal hematoma. Disc levels:  No evidence high-grade spinal canal stenosis. Upper  chest: Aortic atherosclerotic calcifications. Other: None IMPRESSION: 1. No acute intracranial abnormality. 2. No acute fracture or traumatic subluxation of the cervical spine. 3. Postsurgical changes from C4-C6 ACDF. The fusion screw on the left at the C5 level is fractured and slightly retracted, unchanged from prior. Aortic Atherosclerosis (ICD10-I70.0). Electronically Signed   By: Lorenza Cambridge M.D.   On: 05/21/2023 11:41     Discharge Instructions: Discharge Instructions     Call MD for:  difficulty breathing, headache or visual disturbances   Complete by: As directed    Call MD for:  extreme fatigue   Complete by: As directed    Call MD for:  hives   Complete by: As directed    Call MD for:  persistant dizziness or light-headedness   Complete by: As directed    Call MD for:  persistant nausea and vomiting   Complete by: As directed    Call MD for:  redness, tenderness, or signs of infection (pain, swelling, redness, odor or green/yellow discharge around incision site)   Complete by: As directed    Call MD for:  severe uncontrolled pain   Complete by: As directed    Call MD for:  temperature >100.4   Complete by: As directed    Diet - low sodium heart healthy   Complete by: As directed    Discharge instructions   Complete by: As directed    Ms. Daudelin  You were brought to the hospital because you were altered and not acting herself.  In fact when you came to the hospital you were so sleepy that we could not awake you.  We were concerned that this is because of the hemodialysis that you had missed in the past or that some of your medications, specifically your pain medications are too strong or are too concentrated in your blood.  In people with the kidney disease that you have needing that hemodialysis, it is often the case that the medications get accumulated in the system and can cause this level of sleepiness.   We treated you with 2 rounds of hemodialysis while you are in the  hospital and stopped all of your pain medications.  Thankfully you were able to wake up and interact with [place] just like you had done in the past.  For this reason we are discharging you from the hospital with the following recommendations  1. Please make an appointment to see Dr. Malen Gauze at  [facility] in the next week; you will need to have your medications adjusted by him 2. For pain: 3. Continue your diazepam 10 mg every day 4. Continue your trazodone 50 mg (1 pill) up to 3 times per day for pain 5. Your gabapentin needs changes: Decrease how you take it-stop taking the 300 mg dose 3 times a day 6. Start taking gabapentin 100 mg 3 times per day  You were also assessed by the physical therapy and Occupational Therapy while you are hospitalized.  They recommend he continue this therapy when you go home.  You should be contacted to start this therapy sessions at home in the next week or so.  The kidney doctors saw you while you were hospitalized as well.  They also recommend you resume your dialysis sessions on your usual schedule Monday, Wednesday, Friday.  Please contact your hemodialysis assistant for transportation tomorrow for your scheduled Monday session  Please continue taking all of your other scheduled and prescribed medications for your chronic medical conditions.  If you have any questions or feel your symptoms returning or worsening and cannot be seen by your primary care doctor during the regular office hours, return to the emergency department at the urgent care for medical attention.  Take care,  Morene Crocker, MD [facility] internal medicine department   Increase activity slowly   Complete by: As directed        Signed: Morene Crocker, MD [facility] - PGY2 Pager: [phone] 05/23/2023, 12:31 PM    Please contact the on call pager after 5 pm and on weekends at [phone].

## 2023-05-24 ENCOUNTER — Other Ambulatory Visit: Payer: Self-pay

## 2023-05-24 ENCOUNTER — Emergency Department (HOSPITAL_COMMUNITY): Payer: Medicare HMO

## 2023-05-24 ENCOUNTER — Encounter (HOSPITAL_COMMUNITY): Payer: Self-pay

## 2023-05-24 ENCOUNTER — Inpatient Hospital Stay (HOSPITAL_COMMUNITY)
Admission: EM | Admit: 2023-05-24 | Discharge: 2023-05-27 | DRG: 091 | Disposition: A | Discharge status: Home / Self Care | Payer: Medicare HMO | Source: Ambulatory Visit | Attending: Internal Medicine | Admitting: Internal Medicine

## 2023-05-24 DIAGNOSIS — G928 Other toxic encephalopathy: Secondary | ICD-10-CM | POA: Diagnosis not present

## 2023-05-24 DIAGNOSIS — G4710 Hypersomnia, unspecified: Secondary | ICD-10-CM | POA: Diagnosis present

## 2023-05-24 DIAGNOSIS — T424X5A Adverse effect of benzodiazepines, initial encounter: Secondary | ICD-10-CM | POA: Diagnosis present

## 2023-05-24 DIAGNOSIS — Z79899 Other long term (current) drug therapy: Secondary | ICD-10-CM

## 2023-05-24 DIAGNOSIS — Z888 Allergy status to other drugs, medicaments and biological substances status: Secondary | ICD-10-CM

## 2023-05-24 DIAGNOSIS — R4182 Altered mental status, unspecified: Secondary | ICD-10-CM | POA: Diagnosis not present

## 2023-05-24 DIAGNOSIS — Z803 Family history of malignant neoplasm of breast: Secondary | ICD-10-CM

## 2023-05-24 DIAGNOSIS — I5032 Chronic diastolic (congestive) heart failure: Secondary | ICD-10-CM | POA: Diagnosis present

## 2023-05-24 DIAGNOSIS — Z853 Personal history of malignant neoplasm of breast: Secondary | ICD-10-CM

## 2023-05-24 DIAGNOSIS — K5900 Constipation, unspecified: Secondary | ICD-10-CM | POA: Diagnosis present

## 2023-05-24 DIAGNOSIS — Z87891 Personal history of nicotine dependence: Secondary | ICD-10-CM

## 2023-05-24 DIAGNOSIS — Z8249 Family history of ischemic heart disease and other diseases of the circulatory system: Secondary | ICD-10-CM

## 2023-05-24 DIAGNOSIS — G2401 Drug induced subacute dyskinesia: Secondary | ICD-10-CM | POA: Diagnosis present

## 2023-05-24 DIAGNOSIS — Z8673 Personal history of transient ischemic attack (TIA), and cerebral infarction without residual deficits: Secondary | ICD-10-CM

## 2023-05-24 DIAGNOSIS — G8929 Other chronic pain: Secondary | ICD-10-CM | POA: Diagnosis present

## 2023-05-24 DIAGNOSIS — Z880 Allergy status to penicillin: Secondary | ICD-10-CM

## 2023-05-24 DIAGNOSIS — G2581 Restless legs syndrome: Secondary | ICD-10-CM | POA: Diagnosis present

## 2023-05-24 DIAGNOSIS — Z8619 Personal history of other infectious and parasitic diseases: Secondary | ICD-10-CM

## 2023-05-24 DIAGNOSIS — Z9049 Acquired absence of other specified parts of digestive tract: Secondary | ICD-10-CM

## 2023-05-24 DIAGNOSIS — I132 Hypertensive heart and chronic kidney disease with heart failure and with stage 5 chronic kidney disease, or end stage renal disease: Secondary | ICD-10-CM | POA: Diagnosis present

## 2023-05-24 DIAGNOSIS — Z992 Dependence on renal dialysis: Secondary | ICD-10-CM

## 2023-05-24 DIAGNOSIS — N186 End stage renal disease: Secondary | ICD-10-CM | POA: Diagnosis present

## 2023-05-24 DIAGNOSIS — T43595A Adverse effect of other antipsychotics and neuroleptics, initial encounter: Secondary | ICD-10-CM | POA: Diagnosis present

## 2023-05-24 DIAGNOSIS — F319 Bipolar disorder, unspecified: Secondary | ICD-10-CM | POA: Diagnosis present

## 2023-05-24 DIAGNOSIS — I2510 Atherosclerotic heart disease of native coronary artery without angina pectoris: Secondary | ICD-10-CM | POA: Diagnosis present

## 2023-05-24 DIAGNOSIS — Z90710 Acquired absence of both cervix and uterus: Secondary | ICD-10-CM

## 2023-05-24 DIAGNOSIS — D631 Anemia in chronic kidney disease: Secondary | ICD-10-CM | POA: Diagnosis present

## 2023-05-24 DIAGNOSIS — K219 Gastro-esophageal reflux disease without esophagitis: Secondary | ICD-10-CM | POA: Diagnosis present

## 2023-05-24 DIAGNOSIS — T40425A Adverse effect of tramadol, initial encounter: Secondary | ICD-10-CM | POA: Diagnosis present

## 2023-05-24 DIAGNOSIS — F419 Anxiety disorder, unspecified: Secondary | ICD-10-CM | POA: Diagnosis present

## 2023-05-24 DIAGNOSIS — G9340 Encephalopathy, unspecified: Principal | ICD-10-CM | POA: Diagnosis present

## 2023-05-24 LAB — LIPASE, BLOOD: Lipase: 38 U/L (ref 11–51)

## 2023-05-24 LAB — CBC WITH DIFFERENTIAL/PLATELET
Abs Immature Granulocytes: 0.08 10*3/uL — ABNORMAL HIGH (ref 0.00–0.07)
Basophils Absolute: 0.1 10*3/uL (ref 0.0–0.1)
Basophils Relative: 1 %
Eosinophils Absolute: 0.1 10*3/uL (ref 0.0–0.5)
Eosinophils Relative: 2 %
HCT: 29.6 % — ABNORMAL LOW (ref 36.0–46.0)
Hemoglobin: 8.6 g/dL — ABNORMAL LOW (ref 12.0–15.0)
Immature Granulocytes: 2 %
Lymphocytes Relative: 35 %
Lymphs Abs: 1.9 10*3/uL (ref 0.7–4.0)
MCH: 27.3 pg (ref 26.0–34.0)
MCHC: 29.1 g/dL — ABNORMAL LOW (ref 30.0–36.0)
MCV: 94 fL (ref 80.0–100.0)
Monocytes Absolute: 0.3 10*3/uL (ref 0.1–1.0)
Monocytes Relative: 6 %
Neutro Abs: 3 10*3/uL (ref 1.7–7.7)
Neutrophils Relative %: 54 %
Platelets: UNDETERMINED 10*3/uL (ref 150–400)
RBC: 3.15 MIL/uL — ABNORMAL LOW (ref 3.87–5.11)
RDW: 16.2 % — ABNORMAL HIGH (ref 11.5–15.5)
WBC: 5.5 10*3/uL (ref 4.0–10.5)
nRBC: 0 % (ref 0.0–0.2)

## 2023-05-24 LAB — COMPREHENSIVE METABOLIC PANEL
ALT: 8 U/L (ref 0–44)
AST: 14 U/L — ABNORMAL LOW (ref 15–41)
Albumin: 3.5 g/dL (ref 3.5–5.0)
Alkaline Phosphatase: 68 U/L (ref 38–126)
Anion gap: 15 (ref 5–15)
BUN: 17 mg/dL (ref 8–23)
CO2: 19 mmol/L — ABNORMAL LOW (ref 22–32)
Calcium: 8.8 mg/dL — ABNORMAL LOW (ref 8.9–10.3)
Chloride: 107 mmol/L (ref 98–111)
Creatinine, Ser: 2.89 mg/dL — ABNORMAL HIGH (ref 0.44–1.00)
GFR, Estimated: 17 mL/min — ABNORMAL LOW (ref >60)
Glucose, Bld: 75 mg/dL (ref 70–99)
Potassium: 3.8 mmol/L (ref 3.5–5.1)
Sodium: 141 mmol/L (ref 135–145)
Total Bilirubin: 0.4 mg/dL (ref <1.2)
Total Protein: 6.9 g/dL (ref 6.5–8.1)

## 2023-05-24 LAB — AMMONIA: Ammonia: <10 umol/L (ref 9–35)

## 2023-05-24 LAB — ETHANOL: Alcohol, Ethyl (B): <10 mg/dL (ref <10)

## 2023-05-24 LAB — CBG MONITORING, ED: Glucose-Capillary: 86 mg/dL (ref 70–99)

## 2023-05-24 LAB — GLUCOSE, CAPILLARY: Glucose-Capillary: 90 mg/dL (ref 70–99)

## 2023-05-24 NOTE — ED Provider Notes (Signed)
[place] EMERGENCY DEPARTMENT AT [facility] Provider Note   CSN: [id number] Arrival date & time: 05/24/23  1500     History  No chief complaint on file.   Sherry [name] is a 67 y.o. female who was sent in from dialysis for somnolence.  Patient reportedly took Valium and tramadol prior to going to dialysis.  Patient seemed extremely somnolent.  She was unable to complete dialysis due to her level of somnolence.  HPI     Home Medications Prior to Admission medications   Medication Sig Start Date End Date Taking? Authorizing Provider  amLODipine (NORVASC) 10 MG tablet Take 1 tablet by mouth daily. 08/19/22 07/27/23  [provider]  carvedilol (COREG) 12.5 MG tablet Take 25 mg by mouth 2 (two) times daily.    [provider]  diazepam (VALIUM) 10 MG tablet Take 10 mg by mouth as directed. 05/18/23   [provider]  diclofenac Sodium (VOLTAREN) 1 % GEL Apply 4 g topically 4 (four) times daily. 04/06/23   Melene Plan, DO  FLUoxetine (PROZAC) 40 MG capsule Take 40 mg by mouth in the morning. 11/09/18   [provider]  gabapentin (NEURONTIN) 100 MG capsule Take 1 capsule (100 mg total) by mouth 3 (three) times daily. 05/23/23 06/22/23  Morene Crocker, MD  hydrALAZINE (APRESOLINE) 50 MG tablet Take 50 mg by mouth 3 (three) times daily. 03/24/21   [provider]  INGREZZA 80 MG CAPS Take 80 mg by mouth at bedtime. 10/15/17   [provider]  isosorbide dinitrate (ISORDIL) 20 MG tablet Take 20 mg by mouth 3 (three) times daily. 12/30/21   [provider]  lidocaine-prilocaine (EMLA) cream Apply 1 Application topically once. 10/28/21   [provider]  nitroGLYCERIN (NITROSTAT) 0.4 MG SL tablet Place 0.4 mg under the tongue every 5 (five) minutes as needed for chest pain. 12/30/21   [provider]  pantoprazole (PROTONIX) 40 MG tablet Take 1 tablet (40 mg total) by mouth daily. 02/01/22 05/22/23  Meredeth Ide, MD  senna-docusate (SENOKOT-S) 8.6-50 MG tablet Take 1 tablet by mouth at bedtime as needed for mild constipation. 03/02/23   Long, Arlyss Repress, MD  traMADol (ULTRAM) 50 MG tablet Take 50-100 mg by mouth 3 (three) times daily as needed for severe pain.    [provider]      Allergies    Abilify [aripiprazole], Remeron [mirtazapine], Trazodone and nefazodone, Flexeril [cyclobenzaprine], and Amoxicillin    Review of Systems   Review of Systems  Physical Exam Updated Vital Signs BP (!) 146/75   Pulse 80   Temp 98 F (36.7 C) (Oral)   Resp 12   SpO2 98%  Physical Exam Vitals and nursing note reviewed.  Constitutional:      General: She is not in acute distress.    Appearance: She is well-developed. She is not diaphoretic.  HENT:     Head: Normocephalic and atraumatic.     Right Ear: External ear normal.     Left Ear: External ear normal.     Nose: Nose normal.     Mouth/Throat:     Mouth: Mucous membranes are moist.  Eyes:     General: No scleral icterus.    Conjunctiva/sclera: Conjunctivae normal.  Cardiovascular:     Rate and Rhythm: Normal rate and regular rhythm.     Heart sounds: Normal heart sounds. No murmur heard.    No friction rub. No gallop.  Pulmonary:  Effort: Pulmonary effort is normal. No respiratory distress.     Breath sounds: Normal breath sounds.  Abdominal:     General: Bowel sounds are normal. There is no distension.     Palpations: Abdomen is soft. There is no mass.     Tenderness: There is no abdominal tenderness. There is no guarding.  Musculoskeletal:     Cervical back: Normal range of motion.  Skin:    General: Skin is warm and dry.  Neurological:     Mental Status: She is oriented to person, place, and time. She is lethargic.     GCS: GCS eye subscore is 3. GCS verbal subscore is 5. GCS motor subscore is 6.  Psychiatric:        Behavior: Behavior normal.     ED Results / Procedures / Treatments   Labs (all labs  ordered are listed, but only abnormal results are displayed) Labs Reviewed  COMPREHENSIVE METABOLIC PANEL - Abnormal; Notable for the following components:      Result Value   CO2 19 (*)    Creatinine, Ser 2.89 (*)    Calcium 8.8 (*)    AST 14 (*)    GFR, Estimated 17 (*)    All other components within normal limits  CBC WITH DIFFERENTIAL/PLATELET - Abnormal; Notable for the following components:   RBC 3.15 (*)    Hemoglobin 8.6 (*)    HCT 29.6 (*)    MCHC 29.1 (*)    RDW 16.2 (*)    Abs Immature Granulocytes 0.08 (*)    All other components within normal limits  ETHANOL  LIPASE, BLOOD  RAPID URINE DRUG SCREEN, HOSP PERFORMED  AMMONIA  CBG MONITORING, ED  I-STAT VENOUS BLOOD GAS, ED    EKG None  Radiology No results found.  Procedures Procedures    Medications Ordered in ED Medications - No data to display  ED Course/ Medical Decision Making/ A&P                                 Medical Decision Making 67 y/o F w hx of ESRD here for AMS/ Somnolence The differential diagnosis for AMS is extensive and includes, but is not limited to: drug overdose - opioids, alcohol, sedatives, antipsychotics, drug withdrawal, others; Metabolic: hypoxia, hypoglycemia, hyperglycemia, hypercalcemia, hypernatremia, hyponatremia, uremia, hepatic encephalopathy, hypothyroidism, hyperthyroidism, vitamin B12 or thiamine deficiency, carbon monoxide poisoning, Wilson's disease, Lactic acidosis,DKA/HHOS; Infectious: meningitis, encephalitis, bacteremia/sepsis, urinary tract infection, pneumonia, neurosyphilis; Structural: Space-occupying lesion, (brain tumor, subdural hematoma, hydrocephalus,); Vascular: stroke, subarachnoid hemorrhage, coronary ischemia, hypertensive encephalopathy, CNS vasculitis, thrombotic thrombocytopenic purpura, disseminated intravascular coagulation, hyperviscosity; Psychiatric: Schizophrenia, depression; Other: Seizure, hypothermia, heat stroke, ICU psychosis, dementia  -"sundowning."  Work up pending- still altered- will need admission - sign out at shift change to PA  Boyd  Amount and/or Complexity of Data Reviewed Labs: ordered.    Details: CMP/ CBC baseline for Patient - no hyperk VBG ammonia pending Radiology: ordered.    Details: CT head pending  Risk Decision regarding hospitalization.           Final Clinical Impression(s) / ED Diagnoses Final diagnoses:  None    Rx / DC Orders ED Discharge Orders     None         Annaley, [place], PA-C 05/25/23 1333    Royanne Foots, DO 05/28/23 1250

## 2023-05-24 NOTE — ED Triage Notes (Signed)
Patient arrived from dialysis after becoming extremely sleepy. Patient taken off machine 90 minutes early. Patient enndorses taking valium and tramadol prior to going to dialysis. Alert and oriented and easy to awaken

## 2023-05-25 DIAGNOSIS — G9340 Encephalopathy, unspecified: Secondary | ICD-10-CM | POA: Diagnosis not present

## 2023-05-25 LAB — COMPREHENSIVE METABOLIC PANEL
ALT: 9 U/L (ref 0–44)
AST: 12 U/L — ABNORMAL LOW (ref 15–41)
Albumin: 2.9 g/dL — ABNORMAL LOW (ref 3.5–5.0)
Alkaline Phosphatase: 65 U/L (ref 38–126)
Anion gap: 10 (ref 5–15)
BUN: 19 mg/dL (ref 8–23)
CO2: 28 mmol/L (ref 22–32)
Calcium: 9.2 mg/dL (ref 8.9–10.3)
Chloride: 100 mmol/L (ref 98–111)
Creatinine, Ser: 3.48 mg/dL — ABNORMAL HIGH (ref 0.44–1.00)
GFR, Estimated: 14 mL/min — ABNORMAL LOW (ref >60)
Glucose, Bld: 83 mg/dL (ref 70–99)
Potassium: 3.9 mmol/L (ref 3.5–5.1)
Sodium: 138 mmol/L (ref 135–145)
Total Bilirubin: 0.4 mg/dL (ref <1.2)
Total Protein: 6 g/dL — ABNORMAL LOW (ref 6.5–8.1)

## 2023-05-25 LAB — I-STAT VENOUS BLOOD GAS, ED
Acid-Base Excess: 8 mmol/L — ABNORMAL HIGH (ref 0.0–2.0)
Acid-Base Excess: 8 mmol/L — ABNORMAL HIGH (ref 0.0–2.0)
Bicarbonate: 31.8 mmol/L — ABNORMAL HIGH (ref 20.0–28.0)
Bicarbonate: 31.6 mmol/L — ABNORMAL HIGH (ref 20.0–28.0)
Calcium, Ion: 1.13 mmol/L — ABNORMAL LOW (ref 1.15–1.40)
Calcium, Ion: 1.13 mmol/L — ABNORMAL LOW (ref 1.15–1.40)
HCT: 24 % — ABNORMAL LOW (ref 36.0–46.0)
HCT: 24 % — ABNORMAL LOW (ref 36.0–46.0)
Hemoglobin: 8.2 g/dL — ABNORMAL LOW (ref 12.0–15.0)
Hemoglobin: 8.2 g/dL — ABNORMAL LOW (ref 12.0–15.0)
O2 Saturation: 91 %
O2 Saturation: 99 %
Potassium: 3.8 mmol/L (ref 3.5–5.1)
Potassium: 3.9 mmol/L (ref 3.5–5.1)
Sodium: 139 mmol/L (ref 135–145)
Sodium: 138 mmol/L (ref 135–145)
TCO2: 33 mmol/L — ABNORMAL HIGH (ref 22–32)
TCO2: 33 mmol/L — ABNORMAL HIGH (ref 22–32)
pCO2, Ven: 42.3 mm[Hg] — ABNORMAL LOW (ref 44–60)
pCO2, Ven: 40 mm[Hg] — ABNORMAL LOW (ref 44–60)
pH, Ven: 7.484 — ABNORMAL HIGH (ref 7.25–7.43)
pH, Ven: 7.505 — ABNORMAL HIGH (ref 7.25–7.43)
pO2, Ven: 57 mm[Hg] — ABNORMAL HIGH (ref 32–45)
pO2, Ven: 115 mm[Hg] — ABNORMAL HIGH (ref 32–45)

## 2023-05-25 LAB — RAPID URINE DRUG SCREEN, HOSP PERFORMED
Amphetamines: NOT DETECTED
Barbiturates: NOT DETECTED
Benzodiazepines: POSITIVE — AB
Cocaine: NOT DETECTED
Opiates: NOT DETECTED
Tetrahydrocannabinol: NOT DETECTED

## 2023-05-25 LAB — CBC
HCT: 25.5 % — ABNORMAL LOW (ref 36.0–46.0)
Hemoglobin: 7.7 g/dL — ABNORMAL LOW (ref 12.0–15.0)
MCH: 27.7 pg (ref 26.0–34.0)
MCHC: 30.2 g/dL (ref 30.0–36.0)
MCV: 91.7 fL (ref 80.0–100.0)
Platelets: 172 10*3/uL (ref 150–400)
RBC: 2.78 MIL/uL — ABNORMAL LOW (ref 3.87–5.11)
RDW: 16.1 % — ABNORMAL HIGH (ref 11.5–15.5)
WBC: 4.6 10*3/uL (ref 4.0–10.5)
nRBC: 0 % (ref 0.0–0.2)

## 2023-05-25 LAB — CBG MONITORING, ED: Glucose-Capillary: 74 mg/dL (ref 70–99)

## 2023-05-25 LAB — PHOSPHORUS: Phosphorus: 5.1 mg/dL — ABNORMAL HIGH (ref 2.5–4.6)

## 2023-05-25 MED ORDER — PANTOPRAZOLE SODIUM 40 MG PO TBEC
40.0000 mg | DELAYED_RELEASE_TABLET | Freq: Every day | ORAL | Status: DC
  Administered 2023-05-25 – 2023-05-26 (×2): 40 mg via ORAL
  Filled 2023-05-25 (×2): qty 1

## 2023-05-25 MED ORDER — DIAZEPAM 5 MG PO TABS
10.0000 mg | ORAL_TABLET | Freq: Every day | ORAL | Status: DC
  Administered 2023-05-25 – 2023-05-26 (×2): 10 mg via ORAL
  Filled 2023-05-25 (×2): qty 2

## 2023-05-25 MED ORDER — HEPARIN SODIUM (PORCINE) 5000 UNIT/ML IJ SOLN
5000.0000 [IU] | Freq: Three times a day (TID) | INTRAMUSCULAR | Status: DC
  Administered 2023-05-25 – 2023-05-27 (×7): 5000 [IU] via SUBCUTANEOUS
  Filled 2023-05-25 (×7): qty 1

## 2023-05-25 MED ORDER — CHLORHEXIDINE GLUCONATE CLOTH 2 % EX PADS
6.0000 | MEDICATED_PAD | Freq: Every day | CUTANEOUS | Status: DC
  Administered 2023-05-26 – 2023-05-27 (×2): 6 via TOPICAL

## 2023-05-25 MED ORDER — ACETAMINOPHEN 325 MG PO TABS
650.0000 mg | ORAL_TABLET | Freq: Four times a day (QID) | ORAL | Status: DC | PRN
  Administered 2023-05-25: 650 mg via ORAL
  Filled 2023-05-25: qty 2

## 2023-05-25 MED ORDER — FLUOXETINE HCL 20 MG PO CAPS
40.0000 mg | ORAL_CAPSULE | Freq: Every day | ORAL | Status: DC
Start: 2023-05-25 — End: 2023-05-25

## 2023-05-25 MED ORDER — ACETAMINOPHEN 500 MG PO TABS
1000.0000 mg | ORAL_TABLET | Freq: Three times a day (TID) | ORAL | Status: DC
  Administered 2023-05-25 – 2023-05-26 (×3): 1000 mg via ORAL
  Filled 2023-05-25 (×3): qty 2

## 2023-05-25 MED ORDER — ACETAMINOPHEN 650 MG RE SUPP
650.0000 mg | Freq: Four times a day (QID) | RECTAL | Status: DC | PRN
Start: 2023-05-25 — End: 2023-05-25

## 2023-05-25 MED ORDER — HYDRALAZINE HCL 50 MG PO TABS
50.0000 mg | ORAL_TABLET | Freq: Three times a day (TID) | ORAL | Status: DC
  Administered 2023-05-25 – 2023-05-27 (×4): 50 mg via ORAL
  Filled 2023-05-25: qty 1
  Filled 2023-05-25: qty 2
  Filled 2023-05-25 (×3): qty 1

## 2023-05-25 NOTE — ED Notes (Signed)
ED TO INPATIENT HANDOFF REPORT  ED Nurse Name and Phone #: Maretta Los  S Name/Age/Gender Sherry [name] 67 y.o. female Room/Bed: 002C/002C  Code Status   Code Status: Full Code  Home/SNF/Other Home Patient oriented to: self, place, time, and situation Is this baseline? Yes   Triage Complete: Triage complete  Chief Complaint Acute encephalopathy [G93.40]  Triage Note Patient arrived from dialysis after becoming extremely sleepy. Patient taken off machine 90 minutes early. Patient enndorses taking valium and tramadol prior to going to dialysis. Alert and oriented and easy to awaken   Allergies Allergies  Allergen Reactions   Abilify [Aripiprazole] Other (See Comments)    Tardive dyskinesia Oral   Remeron [Mirtazapine] Other (See Comments)    Wgt stimulation /gain, Dizziness, Patient says "can tolerate"   Trazodone And Nefazodone Other (See Comments)    Nightmares/sleep diturbance   Flexeril [Cyclobenzaprine] Other (See Comments)    Pt states Flexeril makes her feel depressed    Amoxicillin Diarrhea and Other (See Comments)    NOTE the patient has had PCN WITHOUT reaction Has patient had a PCN reaction causing immediate rash, facial/tongue/throat swelling, SOB or lightheadedness with hypotension: No Has patient had a PCN reaction causing severe rash involving mucus membranes or skin necrosis: No Has patient had a PCN reaction that required hospitalization: No Has patient had a PCN reaction occurring within the last 10 years: No If all of the above answers are "NO", then may proceed with Cephalosporin use.     Level of Care/Admitting Diagnosis ED Disposition     ED Disposition  Admit   Condition  --   Comment  Hospital Area: [facility] [[id number]]  Level of Care: Progressive [102]  Admit to Progressive based on following criteria: ACUTE MENTAL DISORDER-RELATED Drug/Alcohol Ingestion/Overdose/Withdrawal, Suicidal Ideation/attempt requiring safety  sitter and < Q2h monitoring/assessments, moderate to severe agitation that is managed with medication/sitter, CIWA-Ar score < 20.  May place patient in observation at [facility] or [facility] if equivalent level of care is available:: No  Covid Evaluation: Asymptomatic - no recent exposure (last 10 days) testing not required  Diagnosis: Acute encephalopathy [102725]  Admitting Physician: Earl Lagos [[id number]]  Attending Physician: Earl Lagos [[id number]]          B Medical/Surgery History Past Medical History:  Diagnosis Date   Alcohol use disorder, severe, dependence (HCC) 08/27/2017   Anxiety    Body mass index (BMI) 40.0-44.9, adult (HCC) 07/19/2020   Breast cancer (HCC)    right lumpectemy and lymph node    Cervical spondylosis 05/24/2020   Chronic bilateral low back pain with right-sided sciatica 05/24/2020   Chronic diastolic CHF (congestive heart failure) (HCC) 05/09/2019   Cigarette smoker 12/31/2017   ESRD (end stage renal disease) on dialysis (HCC) 08/22/2021   M-W-F   Essential hypertension 07/06/2017   GERD (gastroesophageal reflux disease)    Hepatitis C virus infection cured after antiviral drug therapy 12/17/2012   Telephone Encounter - Theophilus Kinds, RN - 12/28/2016 9:54 AM EDT Patient called for HCV RNA test results. EOT 08-26-16 - not detected. 12 week post treatment 12-11-16 - not detected. Informed patient that she was cured. Patient verbalized understanding.          Hx of bipolar disorder 01/31/2016   Impaired functional mobility, balance, gait, and endurance 01/09/2021   MRSA infection    of breast incision   Neuroleptic-induced tardive dyskinesia 09/06/2017   Abilify   Opioid use disorder, severe, dependence (HCC) 08/27/2017  Restless legs syndrome 02/13/2013   Formatting of this note might be different from the original. IMPRESSION: Possible. Will follow.   Subacute dyskinesia due to drug 02/13/2013   Formatting of this note might be different  from the original. STORY: Tardive dyskinesia and mild limb dyskinesia, LE>UE which likely due to prolonged antipsychotic used, abilify. Couldn't tolerate artane, gabapentin, clonazepam and xenazine.`E1o3L`IMPRESSION: Well tolerated depakote 250mg  bid, mood aspect is better as well. Will increase to 500mg  bid. ADR was discussed.  RTC 4-6 weeks.   TIA (transient ischemic attack) 2020   P/w BP >230/120 and neurologic symptoms, presumed TIA   Past Surgical History:  Procedure Laterality Date   A/V FISTULAGRAM Right 01/29/2022   Procedure: A/V Fistulagram;  Surgeon: Cephus Shelling, MD;  Location: [facility] INVASIVE CV LAB;  Service: Cardiovascular;  Laterality: Right;   AV FISTULA PLACEMENT Right 05/01/2021   Procedure: RIGHT ARTERIOVENOUS (AV) FISTULA CREATION;  Surgeon: Victorino Sparrow, MD;  Location: [facility] OR;  Service: Vascular;  Laterality: Right;  PERIPHERAL NERVE BLOCK   BACK SURGERY  1990   BASCILIC VEIN TRANSPOSITION Right 09/02/2021   Procedure: RIGHT SECOND STAGE BASILIC VEIN TRANSPOSITION;  Surgeon: Victorino Sparrow, MD;  Location: [facility] OR;  Service: Vascular;  Laterality: Right;  PERIPHERAL NERVE BLOCK   BREAST SURGERY Right 2010   "breast cancer survivor" - states partial mastectomy and nodes   COLONOSCOPY     [facility]   ESOPHAGOGASTRODUODENOSCOPY     [facility]   ESOPHAGOGASTRODUODENOSCOPY  04/18/2020   [facility]   LAPAROSCOPIC CHOLECYSTECTOMY  2002   LAPAROSCOPIC INCISIONAL / UMBILICAL / VENTRAL HERNIA REPAIR  04/13/2018   with BARD 15x 20cm mesh (supraumbilical)   LAPAROSCOPIC LYSIS OF ADHESIONS  07/12/2018   Procedure: LAPAROSCOPIC LYSIS OF ADHESIONS;  Surgeon: Shonna Chock, MD;  Location: [facility] ORS;  Service: Gynecology;;   MULTIPLE TOOTH EXTRACTIONS     MYOMECTOMY     x 2 prior to hysterectomy   PERIPHERAL VASCULAR BALLOON ANGIOPLASTY Right 01/29/2022   Procedure: PERIPHERAL VASCULAR BALLOON ANGIOPLASTY;  Surgeon: Cephus Shelling, MD;  Location: [facility]  INVASIVE CV LAB;  Service: Cardiovascular;  Laterality: Right;  arm fistula   ROBOTIC ASSISTED BILATERAL SALPINGO OOPHERECTOMY Right 07/12/2018   Procedure: XI ROBOTIC ASSISTED RIGHT SALPINGO OOPHORECTOMY;  Surgeon: Shonna Chock, MD;  Location: [facility] ORS;  Service: Gynecology;  Laterality: Right;   TOTAL ABDOMINAL HYSTERECTOMY     fibroids    UPPER GASTROINTESTINAL ENDOSCOPY     WISDOM TOOTH EXTRACTION       A IV Location/Drains/Wounds Patient Lines/Drains/Airways Status     Active Line/Drains/Airways     Name Placement date Placement time Site Days   Peripheral IV 05/21/23 22 G Left;Posterior Hand 05/21/23  1057  Hand  4   Peripheral IV 05/25/23 20 G Anterior;Distal;Right;Upper Arm 05/25/23  0159  Arm  less than 1   Fistula / Graft Right Forearm Arteriovenous fistula 05/01/21  0844  Forearm  754   Fistula / Graft Right Upper arm 09/02/21  0945  Upper arm  630   Fistula / Graft Right Upper arm 01/30/22  --  Upper arm  480   Hemodialysis Catheter Right Subclavian --  --  Subclavian  --            Intake/Output Last 24 hours  Intake/Output Summary (Last 24 hours) at 05/25/2023 1829 Last data filed at 05/25/2023 0503 Gross per 24 hour  Intake --  Output 300 ml  Net -300  ml    Labs/Imaging Results for orders placed or performed during the hospital encounter of 05/24/23 (from the past 48 hour(s))  Comprehensive metabolic panel     Status: Abnormal   Collection Time: 05/24/23  3:40 PM  Result Value Ref Range   Sodium 141 135 - 145 mmol/L   Potassium 3.8 3.5 - 5.1 mmol/L   Chloride 107 98 - 111 mmol/L   CO2 19 (L) 22 - 32 mmol/L   Glucose, Bld 75 70 - 99 mg/dL    Comment: Glucose reference range applies only to samples taken after fasting for at least 8 hours.   BUN 17 8 - 23 mg/dL   Creatinine, Ser 9.56 (H) 0.44 - 1.00 mg/dL   Calcium 8.8 (L) 8.9 - 10.3 mg/dL   Total Protein 6.9 6.5 - 8.1 g/dL   Albumin 3.5 3.5 - 5.0 g/dL   AST 14 (L) 15 - 41 U/L   ALT 8 0 - 44 U/L    Alkaline Phosphatase 68 38 - 126 U/L   Total Bilirubin 0.4 <1.2 mg/dL   GFR, Estimated 17 (L) >60 mL/min    Comment: (NOTE) Calculated using the CKD-EPI Creatinine Equation (2021)    Anion gap 15 5 - 15    Comment: Performed at [facility] Lab, [address] [place], [place] [address]  Ethanol     Status: None   Collection Time: 05/24/23  3:40 PM  Result Value Ref Range   Alcohol, Ethyl (B) <10 <10 mg/dL    Comment: (NOTE) Lowest detectable limit for serum alcohol is 10 mg/dL.  For medical purposes only. Performed at [facility] Lab, [address] [place], [place] [address]   CBC with Differential     Status: Abnormal   Collection Time: 05/24/23  3:40 PM  Result Value Ref Range   WBC 5.5 4.0 - 10.5 K/uL   RBC 3.15 (L) 3.87 - 5.11 MIL/uL   Hemoglobin 8.6 (L) 12.0 - 15.0 g/dL    Comment: REPEATED TO VERIFY   HCT 29.6 (L) 36.0 - 46.0 %   MCV 94.0 80.0 - 100.0 fL   MCH 27.3 26.0 - 34.0 pg   MCHC 29.1 (L) 30.0 - 36.0 g/dL   RDW 69.6 (H) 29.5 - 28.4 %   Platelets PLATELET CLUMPS NOTED ON SMEAR, UNABLE TO ESTIMATE 150 - 400 K/uL   nRBC 0.0 0.0 - 0.2 %   Neutrophils Relative % 54 %   Neutro Abs 3.0 1.7 - 7.7 K/uL   Lymphocytes Relative 35 %   Lymphs Abs 1.9 0.7 - 4.0 K/uL   Monocytes Relative 6 %   Monocytes Absolute 0.3 0.1 - 1.0 K/uL   Eosinophils Relative 2 %   Eosinophils Absolute 0.1 0.0 - 0.5 K/uL   Basophils Relative 1 %   Basophils Absolute 0.1 0.0 - 0.1 K/uL   Immature Granulocytes 2 %   Abs Immature Granulocytes 0.08 (H) 0.00 - 0.07 K/uL    Comment: Performed at [facility] Lab, [address] [place], [place] [address]  Lipase, blood     Status: None   Collection Time: 05/24/23  3:40 PM  Result Value Ref Range   Lipase 38 11 - 51 U/L    Comment: Performed at [facility] Lab, [address] [place], [place] [address]  CBG monitoring, ED     Status: None   Collection Time: 05/24/23  8:21 PM  Result Value Ref Range   Glucose-Capillary 86 70 -  99 mg/dL    Comment: Glucose reference range applies only to samples taken after fasting for at least 8 hours.  Ammonia     Status: None   Collection Time: 05/24/23 11:02 PM  Result Value Ref Range   Ammonia <10 9 - 35 umol/L    Comment: Performed at [facility] Lab, [address], [place], [place] [address]  I-Stat venous blood gas, [facility] ED, [facility], [facility])     Status: Abnormal   Collection Time: 05/24/23 11:09 PM  Result Value Ref Range   pH, Ven 7.484 (H) 7.25 - 7.43   pCO2, Ven 42.3 (L) 44 - 60 mmHg   pO2, Ven 57 (H) 32 - 45 mmHg   Bicarbonate 31.8 (H) 20.0 - 28.0 mmol/L   TCO2 33 (H) 22 - 32 mmol/L   O2 Saturation 91 %   Acid-Base Excess 8.0 (H) 0.0 - 2.0 mmol/L   Sodium 139 135 - 145 mmol/L   Potassium 3.8 3.5 - 5.1 mmol/L   Calcium, Ion 1.13 (L) 1.15 - 1.40 mmol/L   HCT 24.0 (L) 36.0 - 46.0 %   Hemoglobin 8.2 (L) 12.0 - 15.0 g/dL   Sample type VENOUS   Comprehensive metabolic panel     Status: Abnormal   Collection Time: 05/25/23  2:07 AM  Result Value Ref Range   Sodium 138 135 - 145 mmol/L   Potassium 3.9 3.5 - 5.1 mmol/L   Chloride 100 98 - 111 mmol/L   CO2 28 22 - 32 mmol/L   Glucose, Bld 83 70 - 99 mg/dL    Comment: Glucose reference range applies only to samples taken after fasting for at least 8 hours.   BUN 19 8 - 23 mg/dL   Creatinine, Ser 8.46 (H) 0.44 - 1.00 mg/dL   Calcium 9.2 8.9 - 96.2 mg/dL   Total Protein 6.0 (L) 6.5 - 8.1 g/dL   Albumin 2.9 (L) 3.5 - 5.0 g/dL   AST 12 (L) 15 - 41 U/L   ALT 9 0 - 44 U/L   Alkaline Phosphatase 65 38 - 126 U/L   Total Bilirubin 0.4 <1.2 mg/dL   GFR, Estimated 14 (L) >60 mL/min    Comment: (NOTE) Calculated using the CKD-EPI Creatinine Equation (2021)    Anion gap 10 5 - 15    Comment: Performed at [facility] Lab, [address] [place], [place] [address]  CBC     Status: Abnormal   Collection Time: 05/25/23  2:07 AM  Result Value Ref Range   WBC 4.6 4.0 - 10.5 K/uL   RBC 2.78 (L) 3.87 - 5.11 MIL/uL    Hemoglobin 7.7 (L) 12.0 - 15.0 g/dL   HCT 13.2 (L) 44.0 - 10.2 %   MCV 91.7 80.0 - 100.0 fL   MCH 27.7 26.0 - 34.0 pg   MCHC 30.2 30.0 - 36.0 g/dL   RDW 72.5 (H) 36.6 - 44.0 %   Platelets 172 150 - 400 K/uL   nRBC 0.0 0.0 - 0.2 %    Comment: Performed at [facility] Lab, [address], [place], [place] [address]  I-Stat venous blood gas, [facility] ED, [facility], [facility])     Status: Abnormal   Collection Time: 05/25/23  2:12 AM  Result Value Ref Range   pH, Ven 7.505 (H) 7.25 - 7.43   pCO2, Ven 40.0 (L) 44 - 60 mmHg   pO2, Ven 115 (H) 32 - 45 mmHg   Bicarbonate 31.6 (H) 20.0 -  28.0 mmol/L   TCO2 33 (H) 22 - 32 mmol/L   O2 Saturation 99 %   Acid-Base Excess 8.0 (H) 0.0 - 2.0 mmol/L   Sodium 138 135 - 145 mmol/L   Potassium 3.9 3.5 - 5.1 mmol/L   Calcium, Ion 1.13 (L) 1.15 - 1.40 mmol/L   HCT 24.0 (L) 36.0 - 46.0 %   Hemoglobin 8.2 (L) 12.0 - 15.0 g/dL   Sample type VENOUS   CBG monitoring, ED     Status: None   Collection Time: 05/25/23  2:24 AM  Result Value Ref Range   Glucose-Capillary 74 70 - 99 mg/dL    Comment: Glucose reference range applies only to samples taken after fasting for at least 8 hours.  Urine rapid drug screen (hosp performed)     Status: Abnormal   Collection Time: 05/25/23  5:04 AM  Result Value Ref Range   Opiates NONE DETECTED NONE DETECTED   Cocaine NONE DETECTED NONE DETECTED   Benzodiazepines POSITIVE (A) NONE DETECTED   Amphetamines NONE DETECTED NONE DETECTED   Tetrahydrocannabinol NONE DETECTED NONE DETECTED   Barbiturates NONE DETECTED NONE DETECTED    Comment: (NOTE) DRUG SCREEN FOR MEDICAL PURPOSES ONLY.  IF CONFIRMATION IS NEEDED FOR ANY PURPOSE, NOTIFY LAB WITHIN 5 DAYS.  LOWEST DETECTABLE LIMITS FOR URINE DRUG SCREEN Drug Class                     Cutoff (ng/mL) Amphetamine and metabolites    1000 Barbiturate and metabolites    200 Benzodiazepine                 200 Opiates and metabolites        300 Cocaine and metabolites        300 THC                             50 Performed at [facility] Lab, [address], [place], [place] [address]    CT Head Wo Contrast  Result Date: 05/25/2023 CLINICAL DATA:  Altered mental status. EXAM: CT HEAD WITHOUT CONTRAST TECHNIQUE: Contiguous axial images were obtained from the base of the skull through the vertex without intravenous contrast. RADIATION DOSE REDUCTION: This exam was performed according to the departmental dose-optimization program which includes automated exposure control, adjustment of the mA and/or kV according to patient size and/or use of iterative reconstruction technique. COMPARISON:  May 21, 2023 FINDINGS: Brain: There is mild cerebral atrophy with widening of the extra-axial spaces and ventricular dilatation. There are areas of decreased attenuation within the white matter tracts of the supratentorial brain, consistent with microvascular disease changes. Vascular: Marked severity bilateral cavernous carotid artery calcification is noted. Skull: Normal. Negative for fracture or focal lesion. Sinuses/Orbits: No acute finding. Other: None. IMPRESSION: 1. Generalized cerebral atrophy with chronic white matter small vessel ischemic changes. 2. No acute intracranial abnormality. Electronically Signed   By: Aram Candela M.D.   On: 05/25/2023 02:57    Pending Labs Unresulted Labs (From admission, onward)     Start     Ordered   05/25/23 1655  Phosphorus  Add-on,   AD        05/25/23 1654            Vitals/Pain Today's Vitals   05/25/23 1352 05/25/23 1530 05/25/23 1753 05/25/23 1754  BP:  137/70 (!) 188/81   Pulse:  74 81   Resp:  20  17   Temp: (!) 97.4 F (36.3 C)   97.6 F (36.4 C)  TempSrc: Axillary   Oral  SpO2:  100% 97%   PainSc:        Isolation Precautions No active isolations  Medications Medications  heparin injection 5,000 Units (5,000 Units Subcutaneous Given 05/25/23 1346)  hydrALAZINE (APRESOLINE) tablet 50 mg (50 mg Oral Given  05/25/23 1503)  diazepam (VALIUM) tablet 10 mg (10 mg Oral Given 05/25/23 1802)  pantoprazole (PROTONIX) EC tablet 40 mg (40 mg Oral Given 05/25/23 1801)  acetaminophen (TYLENOL) tablet 1,000 mg (1,000 mg Oral Given 05/25/23 1801)  Chlorhexidine Gluconate Cloth 2 % PADS 6 each (has no administration in time range)    Mobility walks with device     Focused Assessments Drowsy duri dial   R Recommendations: See Admitting Provider Note  Report given to:   Additional Notes: Pt was asleep for most of the day, but is now awake and alert, continent, and continuously yelling out.  She said she took her meds for dialysis, and I just gave her a dose of valium and pt states she will be asleep soon.

## 2023-05-25 NOTE — ED Notes (Signed)
Patient is awake and alert, she has been clean, dried, and repositioned. Albertine Grates, MD notified.

## 2023-05-25 NOTE — ED Notes (Signed)
Patient resting in bed with eyes closed, no s/s of distress, she has repositioned her self, will continue to monitor.

## 2023-05-25 NOTE — ED Provider Notes (Signed)
I consulted with internal medicine team, who are appreciated for coming to admit.  Per chart review, it looks like patient was just here for the same.  Will need readmission for acute encephalopathy.  Thought 2/2 polypharmacy.   Roxy Horseman, PA-C 05/25/23 0105    Marily Memos, MD 05/26/23 0000

## 2023-05-25 NOTE — ED Notes (Signed)
Attempt to call report to floor in addition to the SBAR, no answer after secretary transferred me.

## 2023-05-25 NOTE — Consult Note (Signed)
Renal Service Consult Note [facility]  Sherry [name] 05/25/2023 Sherry Krabbe, MD Requesting Physician: Dr. Heide [name]  Reason for Consult: ESRD pt w/ AMS HPI: The patient is a 67 y.o. year-old w/ PMH as below who presented to ED w/ AMS. She was just here for the same issues and was dc'd on 11/10, 2 days ago. In ED pt was hypersomnolent, BP 140/70, HR 80, afeb, normal SpO2. Labs unremarkable. CT head negative. Pt admitted. We are asked to see for dialysis.   Pt seen in ED. Pt is arousable and knows [facility] and 2024.  Then falls back asleep.   ROS - n/a   Past Medical History  Past Medical History:  Diagnosis Date   Alcohol use disorder, severe, dependence (HCC) 08/27/2017   Anxiety    Body mass index (BMI) 40.0-44.9, adult (HCC) 07/19/2020   Breast cancer (HCC)    right lumpectemy and lymph node    Cervical spondylosis 05/24/2020   Chronic bilateral low back pain with right-sided sciatica 05/24/2020   Chronic diastolic CHF (congestive heart failure) (HCC) 05/09/2019   Cigarette smoker 12/31/2017   ESRD (end stage renal disease) on dialysis (HCC) 08/22/2021   M-W-F   Essential hypertension 07/06/2017   GERD (gastroesophageal reflux disease)    Hepatitis C virus infection cured after antiviral drug therapy 12/17/2012   Telephone Encounter - Sherry Kinds, RN - 12/28/2016 9:54 AM EDT Patient called for HCV RNA test results. EOT 08-26-16 - not detected. 12 week post treatment 12-11-16 - not detected. Informed patient that she was cured. Patient verbalized understanding.          Hx of bipolar disorder 01/31/2016   Impaired functional mobility, balance, gait, and endurance 01/09/2021   MRSA infection    of breast incision   Neuroleptic-induced tardive dyskinesia 09/06/2017   Abilify   Opioid use disorder, severe, dependence (HCC) 08/27/2017   Restless legs syndrome 02/13/2013   Formatting of this note might be different from the original. IMPRESSION: Possible. Will  follow.   Subacute dyskinesia due to drug 02/13/2013   Formatting of this note might be different from the original. STORY: Tardive dyskinesia and mild limb dyskinesia, LE>UE which likely due to prolonged antipsychotic used, abilify. Couldn't tolerate artane, gabapentin, clonazepam and xenazine.`E1o3L`IMPRESSION: Well tolerated depakote 250mg  bid, mood aspect is better as well. Will increase to 500mg  bid. ADR was discussed.  RTC 4-6 weeks.   TIA (transient ischemic attack) 2020   P/w BP >230/120 and neurologic symptoms, presumed TIA   Past Surgical History  Past Surgical History:  Procedure Laterality Date   A/V FISTULAGRAM Right 01/29/2022   Procedure: A/V Fistulagram;  Surgeon: Sherry Shelling, MD;  Location: [facility] INVASIVE CV LAB;  Service: Cardiovascular;  Laterality: Right;   AV FISTULA PLACEMENT Right 05/01/2021   Procedure: RIGHT ARTERIOVENOUS (AV) FISTULA CREATION;  Surgeon: Sherry Sparrow, MD;  Location: [facility] OR;  Service: Vascular;  Laterality: Right;  PERIPHERAL NERVE BLOCK   BACK SURGERY  1990   BASCILIC VEIN TRANSPOSITION Right 09/02/2021   Procedure: RIGHT SECOND STAGE BASILIC VEIN TRANSPOSITION;  Surgeon: Sherry Sparrow, MD;  Location: [facility] OR;  Service: Vascular;  Laterality: Right;  PERIPHERAL NERVE BLOCK   BREAST SURGERY Right 2010   "breast cancer survivor" - states partial mastectomy and nodes   COLONOSCOPY     [facility]   ESOPHAGOGASTRODUODENOSCOPY     [facility]   ESOPHAGOGASTRODUODENOSCOPY  04/18/2020   [place] [facility]   LAPAROSCOPIC CHOLECYSTECTOMY  2002  LAPAROSCOPIC INCISIONAL / UMBILICAL / VENTRAL HERNIA REPAIR  04/13/2018   with BARD 15x 20cm mesh (supraumbilical)   LAPAROSCOPIC LYSIS OF ADHESIONS  07/12/2018   Procedure: LAPAROSCOPIC LYSIS OF ADHESIONS;  Surgeon: Sherry Chock, MD;  Location: [facility] ORS;  Service: Gynecology;;   MULTIPLE TOOTH EXTRACTIONS     MYOMECTOMY     x 2 prior to hysterectomy   PERIPHERAL VASCULAR BALLOON ANGIOPLASTY  Right 01/29/2022   Procedure: PERIPHERAL VASCULAR BALLOON ANGIOPLASTY;  Surgeon: Sherry Shelling, MD;  Location: [facility] INVASIVE CV LAB;  Service: Cardiovascular;  Laterality: Right;  arm fistula   ROBOTIC ASSISTED BILATERAL SALPINGO OOPHERECTOMY Right 07/12/2018   Procedure: XI ROBOTIC ASSISTED RIGHT SALPINGO OOPHORECTOMY;  Surgeon: Sherry Chock, MD;  Location: [facility] ORS;  Service: Gynecology;  Laterality: Right;   TOTAL ABDOMINAL HYSTERECTOMY     fibroids    UPPER GASTROINTESTINAL ENDOSCOPY     WISDOM TOOTH EXTRACTION     Family History  Family History  Problem Relation Age of Onset   Heart attack Mother    Breast cancer Mother 34   Dementia Mother    Cancer Father 31       died of bleeding from kidneys   Colon cancer Neg Hx    Esophageal cancer Neg Hx    Stomach cancer Neg Hx    Rectal cancer Neg Hx    Social History  reports that she has been smoking cigarettes. She has never been exposed to tobacco smoke. She has never used smokeless tobacco. She reports that she does not currently use alcohol. She reports that she does not currently use drugs. Allergies  Allergies  Allergen Reactions   Abilify [Aripiprazole] Other (See Comments)    Tardive dyskinesia Oral   Remeron [Mirtazapine] Other (See Comments)    Wgt stimulation /gain, Dizziness, Patient says "can tolerate"   Trazodone And Nefazodone Other (See Comments)    Nightmares/sleep diturbance   Flexeril [Cyclobenzaprine] Other (See Comments)    Pt states Flexeril makes her feel depressed    Amoxicillin Diarrhea and Other (See Comments)    NOTE the patient has had PCN WITHOUT reaction Has patient had a PCN reaction causing immediate rash, facial/tongue/throat swelling, SOB or lightheadedness with hypotension: No Has patient had a PCN reaction causing severe rash involving mucus membranes or skin necrosis: No Has patient had a PCN reaction that required hospitalization: No Has patient had a PCN reaction occurring within the  last 10 years: No If all of the above answers are "NO", then may proceed with Cephalosporin use.    Home medications Prior to Admission medications   Medication Sig Start Date End Date Taking? Authorizing Provider  amLODipine (NORVASC) 10 MG tablet Take 1 tablet by mouth daily. 08/19/22 07/27/23  [provider]  carvedilol (COREG) 12.5 MG tablet Take 25 mg by mouth 2 (two) times daily.    [provider]  diazepam (VALIUM) 10 MG tablet Take 10 mg by mouth as directed. 05/18/23   [provider]  diclofenac Sodium (VOLTAREN) 1 % GEL Apply 4 g topically 4 (four) times daily. 04/06/23   Melene Plan, DO  FLUoxetine (PROZAC) 40 MG capsule Take 40 mg by mouth in the morning. 11/09/18   [provider]  gabapentin (NEURONTIN) 100 MG capsule Take 1 capsule (100 mg total) by mouth 3 (three) times daily. 05/23/23 06/22/23  Morene Crocker, MD  hydrALAZINE (APRESOLINE) 50 MG tablet Take 50 mg by mouth 3 (three) times daily. 03/24/21   [provider]  INGREZZA 80 MG CAPS Take 80 mg by mouth at bedtime. 10/15/17   [provider]  isosorbide dinitrate (ISORDIL) 20 MG tablet Take 20 mg by mouth 3 (three) times daily. 12/30/21   [provider]  lidocaine-prilocaine (EMLA) cream Apply 1 Application topically once. 10/28/21   [provider]  nitroGLYCERIN (NITROSTAT) 0.4 MG SL tablet Place 0.4 mg under the tongue every 5 (five) minutes as needed for chest pain. 12/30/21   [provider]  pantoprazole (PROTONIX) 40 MG tablet Take 1 tablet (40 mg total) by mouth daily. 02/01/22 05/22/23  Meredeth Ide, MD  senna-docusate (SENOKOT-S) 8.6-50 MG tablet Take 1 tablet by mouth at bedtime as needed for mild constipation. 03/02/23   Long, Arlyss Repress, MD  traMADol (ULTRAM) 50 MG tablet Take 50-100 mg by mouth 3 (three) times daily as needed for severe pain.    [provider]     Vitals:   05/25/23 0930 05/25/23 1230 05/25/23 1352  05/25/23 1530  BP: (!) 178/81 (!) 177/82  137/70  Pulse: 77 78  74  Resp: 13 13  20   Temp:   (!) 97.4 F (36.3 C)   TempSrc:   Axillary   SpO2: 97% 98%  100%   Exam Gen alert, no distress, somnolent, pleasant when awakened No rash, cyanosis or gangrene Sclera anicteric, throat clear  No jvd or bruits Chest clear bilat to bases, no rales/ wheezing RRR no MRG Abd soft ntnd no mass or ascites +bs GU defer MS no joint effusions or deformity Ext no LE or UE edema, no wounds or ulcers Neuro is alert, Ox 3 , nf    LUA AVF+bruit       Renal-related home meds: - norvasc 10 - coreg 25 bid - gabapentin 100 tid - hydralazine 50 tid - isordil 20 tid    OP HD: SW MWF  3h   67.1kg  2/2 bath  450/500  Heparin 3500   - pending - last inpatient HD was 11/08 and 11/09, dc'd on 11/10   Assessment/ Plan: AMS - per pmd most likely polypharmacy again. Per pmd.  ESKD - on HD MWF. Labs and vol stable. Plan HD tomorrow.  HTN - cont home meds.  Volume - no gross vol overload on exam. On RA. UF 2-3 L w/ HD tomorrow.  Anemia of eskd - Hb 7- 9 here. Follow.  MBD ckd - CCa in range. Get records in am.  Psych - hx anxiety/ depression/ bipolar and tardive dyskinesia. Per Coletta Memos  MD [facility] 05/25/2023, 4:41 PM  Recent Labs  Lab 05/22/23 0704 05/23/23 0236 05/24/23 1540 05/24/23 2309 05/25/23 0207 05/25/23 0212  HGB 8.4* 7.8* 8.6*   < > 7.7* 8.2*  ALBUMIN 3.0* 2.8* 3.5  --  2.9*  --   CALCIUM 9.1 9.0 8.8*  --  9.2  --   PHOS 4.7* 4.1  --   --   --   --   CREATININE 3.45* 2.66* 2.89*  --  3.48*  --   K 3.8 3.3* 3.8   < > 3.9 3.9   < > = values in this interval not displayed.   Inpatient medications:  heparin  5,000 Units Subcutaneous Q8H   hydrALAZINE  50 mg Oral Q8H    acetaminophen **OR** acetaminophen

## 2023-05-25 NOTE — ED Notes (Signed)
Patient awake and alert, oriented to self and place at this time.

## 2023-05-25 NOTE — ED Notes (Signed)
Patient left the floor in stable condition, AOX4, able to verbalize her needs, with her belongings and rolling walker, and staff.

## 2023-05-25 NOTE — Progress Notes (Signed)
HD#0 SUBJECTIVE:  Patient Summary: Sherry [name] is a 67 y.o. with a pertinent PMH of ESRD on HD,Polypharmacy, Depression and anxiety , who presented with altered mental status and admitted for acute encephalopathy likely in the setting of polypharmacy, had same presentation few days ago.  Overnight Events: Admitted   Interim History: Unable to  assess patient due to hypersomnolence   OBJECTIVE:  Vital Signs: Vitals:   05/25/23 1352 05/25/23 1530 05/25/23 1753 05/25/23 1754  BP:  137/70 (!) 188/81   Pulse:  74 81   Resp:  20 17   Temp: (!) 97.4 F (36.3 C)   97.6 F (36.4 C)  TempSrc: Axillary   Oral  SpO2:  100% 97%    Supplemental O2: Room Air SpO2: 97 %  There were no vitals filed for this visit.   Intake/Output Summary (Last 24 hours) at 05/25/2023 1843 Last data filed at 05/25/2023 0503 Gross per 24 hour  Intake --  Output 300 ml  Net -300 ml   Net IO Since Admission: -300 mL [05/25/23 1843]  Physical Exam: Physical Exam Constitutional:      Appearance: She is obese. She is diaphoretic. She is not ill-appearing or toxic-appearing.     Comments: Laying in bed , hypersomnolence. Only responds to sternal rub  Dried and flacky lips   Pulmonary:     Effort: Pulmonary effort is normal.  Skin:    General: Skin is warm.  Neurological:     Mental Status: She is disoriented.  Psychiatric:     Comments: Unable to access     Patient Lines/Drains/Airways Status     Active Line/Drains/Airways     Name Placement date Placement time Site Days   Peripheral IV 05/21/23 22 G Left;Posterior Hand 05/21/23  1057  Hand  4   Peripheral IV 05/25/23 20 G Anterior;Distal;Right;Upper Arm 05/25/23  0159  Arm  less than 1   Fistula / Graft Right Forearm Arteriovenous fistula 05/01/21  0844  Forearm  754   Fistula / Graft Right Upper arm 09/02/21  0945  Upper arm  630   Fistula / Graft Right Upper arm 01/30/22  --  Upper arm  480   Hemodialysis Catheter Right Subclavian --  --   Subclavian  --             ASSESSMENT/PLAN:  Assessment: Principal Problem:   Acute encephalopathy  Plan: #Acute encephalopathy 2/2 to Polypharmacy Patient presented from her HD clinic due to concerns for altered mental status. Same presentation few days ago and discharged after she got better after multiple HD session. UDS positive for Benzodiazepine but negative for other substances. It is unclear at this time what she  ingested. Patient will benefit from medication reconciliation, suspects she is on multiple centrally acting medication that needs to be evaluated. Will consider contacting her PCP for more collateral as patient is not able to give any history at this time.  - HD tomorrow  - Reassess patient after dialysis   #End Stage Renal Disease on HD MWF - Nephrology following , continue home dialysis schedule while inpatient  - Dr Bertram Millard greatly appreciated   #Chronic Conditions   HTN:  Continue home hydralazine  Anemia of CKD: - WCTM Anxiety/Bipolar Disorder/Depression- Start Prozac ,Diazepam  GERD:  Start Pantoprazole   Best Practice: Diet: NPO VTE: heparin injection 5,000 Units Start: 05/25/23 0600 Code: Full AB: No ABX  DISPO: Anticipated discharge tomorrow to Home pending  HD  .  Signature: Criss Alvine  Mickie Bail , MD Internal Medicine Resident, PGY-1 [facility]  Pager: [phone] 6:43 PM, 05/25/2023   Please contact the on call pager after 5 pm and on weekends at [phone].

## 2023-05-25 NOTE — Hospital Course (Addendum)
  Acute encephalopathy likely secondary to polypharmacy  ESRD on HD MWF Patient presented from her hemodialysis session due to concerns for altered mental status likely thought to be secondary to medication ingestion.  Patient had presented to [place] a couple days ago with similar presentation and discharged for multiple sessions of dialysis.  It was suspected that her centrally acting medication was inducing this encephalopathy.  Patient was dialyzed during this hospitalization and she got much better.  Her home medication was fully reconciled with pharmacy and adjusted appropriately.  Patient was recommended to stop her diazepam and decrease her gabapentin dose to 100 mg only after hemodialysis on Mondays, Wednesdays and Fridays until she sees her primary care physician for further medication adjustment.   Disposition and follow-up:   Ms.Deb Makela was discharged from [facility] in Stable condition.  At the hospital follow up visit please address:   1.  Follow-up:   *Acute encephalopathy likely secondary to polypharmacy  *ESRD on HD MWF - Please ensure patient is complaint to her dialysis schedule  - Monitor RFP    *Pain management *Bilateral hip and lower back pain *Metabolic encephalopathy *Polypharmacy and hx of polysubstance abuse - Please assess pain level and optimize pain control appropriately  - Recommend continual medication reconciliation as needed       2.  Labs / imaging needed at time of follow-up: CBC, RFP   3.  Pending labs/ test needing follow-up: Repeat sleep study    4.  Medication Changes             STOPPED             - Valium NB: Patient somewhat not agreeable to stop at this time. Patient is counseled to decrease dose from 10 mg to 5 mg until she see her PCP.***               MODIFIED    Gabapentin    1. Take 100 mg on Monday after dialysis   2. Take 100 mg on Wednesday after dialysis   3. Take 100 mg on Friday after dialysis  Ms.  Pacholski   You were brought to the hospital from your dialysis clinic because you were very sleepy , confused and not answering question appropriately. You were admitted a couple of days ago with similar presentation. We think your medications is causing this sleepy and confused episodes.PLEASE take note of these medication changes until you see your primary care physician.  1.Please make an appointment to see Dr. Malen Gauze at [facility] in the next week; you will need to have your medications adjusted by him 2.For pain: 3.Continue your tramadol 50 mg (1 pill) up to 3 times per day for pain 4.Stop taking gabapentin. 5.Decrease valium to 5 mg (1/2 tablet) only before dialysis.   Please continue taking all of your other scheduled and prescribed medications for your chronic medical conditions. Please ensure you don't miss your dialysis sessions.   If you have any questions or feel your symptoms returning or worsening and cannot be seen by your primary care doctor during the regular office hours, return to the emergency department at the urgent care for medical attention.  Take care,  Dr. Kathleen Lime, MD     *** Things to take a look at:  STOP gabapentin  Decrease valium to 5mg   Contacting Dr. Malen Gauze

## 2023-05-25 NOTE — H&P (Addendum)
Date: 05/25/2023               Patient Name:  Sherry [name] MRN: [id number]  DOB: 07/26/1955 Age / Sex: 67 y.o., female   PCP: Pcp, No         Medical Service: Internal Medicine Teaching Service         Attending Physician: Dr. Earl Lagos, MD      First Contact: Dr. Kathleen Lime, MD Pager [phone]    Second Contact: Dr. Morene Crocker, MD Pager [phone]         After Hours (After 5p/  First Contact Pager: [phone]  weekends / holidays): Second Contact Pager: [phone]   SUBJECTIVE   Chief Complaint: drowsy during dialysis  History of Present Illness:  [name] is a 67 yo female with a PMH of ESRD on HD MWF, polypharmacy (multiple centrally acting, renally cleared medications), history of polysubstance abuse (alcohol, crack cocaine, opioids), bipolar disorder, depression, anxiety, tardive dyskinesia, and chronic hip and back pain, who presents to the ED directly from dialysis due to concerns for drowsiness and altered mental status. Her dialysis session was ended 90 minutes early. She was just discharged on 11/9 for a similar presentation of acute encephalopathy in the setting of polypharmacy. She admitted to ED staff that she took valium and tramadol prior to her dialysis session today.   Unable to gather full history at this time given patient's mental status and somnolence.  She states that she does not remember what happened prior to going to the hospital.  She is oriented to self and place.  She fell asleep several times while I was talking to her and struggled to keep her eyes open.  ED Course: Hypersomnolent on presentation.  Hemodynamically stable. BP 146/75, HR 80, afebrile, normal spO2 and RR.  CMP unremarkable.  CBC showed normocytic anemia around baseline.  Glucose 75.  Alcohol level wnl. Ammonia level wnl.  CT head unremarkable.  Meds:  Per recent hospital discharge summary: Amlodipine 10 mg daily Hydralazine 50 mg TID Isosorbide dinitrate 20 mg  TID Carvedilol 25 mg BID Prozac 40 mg daily Melatonin 10 mg Protonix 40 mg daily Voltaren gel Lidocaine-prilocaine cream Ingrezza 80 mg nightly Nitroglycerin 0.4 mg prn Protonix 40 mg daily Senna 1 tablet nightly prn Gabapentin 100 mg TID Diazepam 10 mg daily Tramadol 50 mg - last refill for 180 pills for 30 days  Past Medical History:  Past Medical History:  Diagnosis Date   Alcohol use disorder, severe, dependence (HCC) 08/27/2017   Anxiety    Body mass index (BMI) 40.0-44.9, adult (HCC) 07/19/2020   Breast cancer (HCC)    right lumpectemy and lymph node    Cervical spondylosis 05/24/2020   Chronic bilateral low back pain with right-sided sciatica 05/24/2020   Chronic diastolic CHF (congestive heart failure) (HCC) 05/09/2019   Cigarette smoker 12/31/2017   ESRD (end stage renal disease) on dialysis (HCC) 08/22/2021   M-W-F   Essential hypertension 07/06/2017   GERD (gastroesophageal reflux disease)    Hepatitis C virus infection cured after antiviral drug therapy 12/17/2012   Telephone Encounter - Theophilus Kinds, RN - 12/28/2016 9:54 AM EDT Patient called for HCV RNA test results. EOT 08-26-16 - not detected. 12 week post treatment 12-11-16 - not detected. Informed patient that she was cured. Patient verbalized understanding.          Hx of bipolar disorder 01/31/2016   Impaired functional mobility, balance, gait, and endurance 01/09/2021   MRSA infection  of breast incision   Neuroleptic-induced tardive dyskinesia 09/06/2017   Abilify   Opioid use disorder, severe, dependence (HCC) 08/27/2017   Restless legs syndrome 02/13/2013   Formatting of this note might be different from the original. IMPRESSION: Possible. Will follow.   Subacute dyskinesia due to drug 02/13/2013   Formatting of this note might be different from the original. STORY: Tardive dyskinesia and mild limb dyskinesia, LE>UE which likely due to prolonged antipsychotic used, abilify. Couldn't tolerate artane,  gabapentin, clonazepam and xenazine.`E1o3L`IMPRESSION: Well tolerated depakote 250mg  bid, mood aspect is better as well. Will increase to 500mg  bid. ADR was discussed.  RTC 4-6 weeks.   TIA (transient ischemic attack) 2020   P/w BP >230/120 and neurologic symptoms, presumed TIA    Past Surgical History:  Past Surgical History:  Procedure Laterality Date   A/V FISTULAGRAM Right 01/29/2022   Procedure: A/V Fistulagram;  Surgeon: Cephus Shelling, MD;  Location: [facility] INVASIVE CV LAB;  Service: Cardiovascular;  Laterality: Right;   AV FISTULA PLACEMENT Right 05/01/2021   Procedure: RIGHT ARTERIOVENOUS (AV) FISTULA CREATION;  Surgeon: Victorino Sparrow, MD;  Location: [facility] OR;  Service: Vascular;  Laterality: Right;  PERIPHERAL NERVE BLOCK   BACK SURGERY  1990   BASCILIC VEIN TRANSPOSITION Right 09/02/2021   Procedure: RIGHT SECOND STAGE BASILIC VEIN TRANSPOSITION;  Surgeon: Victorino Sparrow, MD;  Location: [facility] OR;  Service: Vascular;  Laterality: Right;  PERIPHERAL NERVE BLOCK   BREAST SURGERY Right 2010   "breast cancer survivor" - states partial mastectomy and nodes   COLONOSCOPY     [facility]   ESOPHAGOGASTRODUODENOSCOPY     [facility]   ESOPHAGOGASTRODUODENOSCOPY  04/18/2020   [place] [facility]   LAPAROSCOPIC CHOLECYSTECTOMY  2002   LAPAROSCOPIC INCISIONAL / UMBILICAL / VENTRAL HERNIA REPAIR  04/13/2018   with BARD 15x 20cm mesh (supraumbilical)   LAPAROSCOPIC LYSIS OF ADHESIONS  07/12/2018   Procedure: LAPAROSCOPIC LYSIS OF ADHESIONS;  Surgeon: Shonna Chock, MD;  Location: [facility] ORS;  Service: Gynecology;;   MULTIPLE TOOTH EXTRACTIONS     MYOMECTOMY     x 2 prior to hysterectomy   PERIPHERAL VASCULAR BALLOON ANGIOPLASTY Right 01/29/2022   Procedure: PERIPHERAL VASCULAR BALLOON ANGIOPLASTY;  Surgeon: Cephus Shelling, MD;  Location: [facility] INVASIVE CV LAB;  Service: Cardiovascular;  Laterality: Right;  arm fistula   ROBOTIC ASSISTED BILATERAL SALPINGO OOPHERECTOMY Right  07/12/2018   Procedure: XI ROBOTIC ASSISTED RIGHT SALPINGO OOPHORECTOMY;  Surgeon: Shonna Chock, MD;  Location: [facility] ORS;  Service: Gynecology;  Laterality: Right;   TOTAL ABDOMINAL HYSTERECTOMY     fibroids    UPPER GASTROINTESTINAL ENDOSCOPY     WISDOM TOOTH EXTRACTION     Social: Unable to assess due to mental status Lives With: Occupation: Support: Level of Function: PCP: Substances:  Family History: Family History  Problem Relation Age of Onset   Heart attack Mother    Breast cancer Mother 62   Dementia Mother    Cancer Father 50       died of bleeding from kidneys   Colon cancer Neg Hx    Esophageal cancer Neg Hx    Stomach cancer Neg Hx    Rectal cancer Neg Hx    Allergies: Allergies as of 05/24/2023 - Review Complete 05/24/2023  Allergen Reaction Noted   Abilify [aripiprazole] Other (See Comments) 09/06/2017   Remeron [mirtazapine] Other (See Comments) 09/06/2017   Trazodone and nefazodone Other (See Comments) 09/06/2017   Flexeril [cyclobenzaprine] Other (See Comments)  04/14/2015   Amoxicillin Diarrhea and Other (See Comments) 08/12/2017   Review of Systems: A complete ROS was negative except as per HPI.   OBJECTIVE:   Physical Exam: Blood pressure (!) 149/75, pulse 81, temperature 98 F (36.7 C), temperature source Oral, resp. rate 17, SpO2 98%.  Constitutional: somnolent, responds to commands, in NAD; exam limited by patient somnolence HENT: normocephalic atraumatic, dry mucous membranes Eyes: normal pupil size, PERRL Cardiovascular: regular rate and rhythm, no m/r/g; no LE edema Pulmonary/Chest: normal work of breathing on room air, lungs clear to auscultation bilaterally Abdominal: soft, non-tender, non-distended MSK: normal bulk and tone Neurological: oriented to self and place; no CN deficits noted. Neuro exam limited by mental status. Face symmetric, no drooping. Moves left leg, foot and arm on command; moves right foot but only bends right knee  minimally despite continued requests; unable to hold right leg against gravity and grimaces with passive motion; grip strength 4/5 bilaterally; moves both upper extremities actively. Abnormal mouth/tongue movements in the setting of tardive dyskinesia. Psych: Unable to assess  Labs: CBC    Component Value Date/Time   WBC 5.5 05/24/2023 1540   RBC 3.15 (L) 05/24/2023 1540   HGB 8.2 (L) 05/25/2023 0212   HGB 10.2 (L) 07/25/2020 1104   HCT 24.0 (L) 05/25/2023 0212   HCT 30.8 (L) 07/25/2020 1104   PLT PLATELET CLUMPS NOTED ON SMEAR, UNABLE TO ESTIMATE 05/24/2023 1540   PLT 258 07/25/2020 1104   MCV 94.0 05/24/2023 1540   MCV 85 07/25/2020 1104   MCH 27.3 05/24/2023 1540   MCHC 29.1 (L) 05/24/2023 1540   RDW 16.2 (H) 05/24/2023 1540   RDW 16.4 (H) 07/25/2020 1104   LYMPHSABS 1.9 05/24/2023 1540   LYMPHSABS 1.9 11/08/2017 1430   MONOABS 0.3 05/24/2023 1540   EOSABS 0.1 05/24/2023 1540   EOSABS 0.2 11/08/2017 1430   BASOSABS 0.1 05/24/2023 1540   BASOSABS 0.0 11/08/2017 1430     CMP     Component Value Date/Time   NA 138 05/25/2023 0212   NA 141 07/25/2020 1104   K 3.9 05/25/2023 0212   CL 100 05/25/2023 0207   CO2 28 05/25/2023 0207   GLUCOSE 83 05/25/2023 0207   BUN 19 05/25/2023 0207   BUN 41 (H) 07/25/2020 1104   CREATININE 3.48 (H) 05/25/2023 0207   CALCIUM 9.2 05/25/2023 0207   PROT 6.0 (L) 05/25/2023 0207   PROT 6.9 07/25/2020 1104   ALBUMIN 2.9 (L) 05/25/2023 0207   ALBUMIN 4.2 07/25/2020 1104   AST 12 (L) 05/25/2023 0207   ALT 9 05/25/2023 0207   ALKPHOS 65 05/25/2023 0207   BILITOT 0.4 05/25/2023 0207   BILITOT <0.2 07/25/2020 1104   GFRNONAA 14 (L) 05/25/2023 0207   GFRAA 18 (L) 07/25/2020 1104   Ammonia: wnl Ethanol: wnl  Imaging: CT Head Wo Contrast CLINICAL DATA:  Altered mental status.  EXAM: CT HEAD WITHOUT CONTRAST  TECHNIQUE: Contiguous axial images were obtained from the base of the skull through the vertex without intravenous  contrast.  RADIATION DOSE REDUCTION: This exam was performed according to the departmental dose-optimization program which includes automated exposure control, adjustment of the mA and/or kV according to patient size and/or use of iterative reconstruction technique.  COMPARISON:  May 21, 2023  FINDINGS: Brain: There is mild cerebral atrophy with widening of the extra-axial spaces and ventricular dilatation. There are areas of decreased attenuation within the white matter tracts of the supratentorial brain, consistent with microvascular disease changes.  Vascular:  Marked severity bilateral cavernous carotid artery calcification is noted.  Skull: Normal. Negative for fracture or focal lesion.  Sinuses/Orbits: No acute finding.  Other: None.  IMPRESSION: 1. Generalized cerebral atrophy with chronic white matter small vessel ischemic changes. 2. No acute intracranial abnormality.  Electronically Signed   By: Aram Candela M.D.   On: 05/25/2023 02:57   EKG: personally reviewed my interpretation is sinus rhythm, no acute ischemic changes, Qtc 473. Prior EKG 05/21/23 is comparable.   ASSESSMENT & PLAN:   Assessment & Plan by Problem: Principal Problem:   Acute encephalopathy  Jayelynn Petersheim is a 67 y.o. person living with a history of ESRD on HD MWF, chronic back pain, HTN, polysubstance use, hepatitis C, bipolar disorder, tardive dyskinesia, GERD, chronic diastolic heart failure, and anxiety who presented with hypersomnolence and is admitted for acute encephalopathy, likely secondary to polypharmacy on hospital day 0  Acute encephalopathy, likely secondary to polypharmacy  Given her presentation and recent prior hospitalization, the most likely cause of her acute encephalopathic symptoms seems to be polypharmacy in the setting of poor renal clearance. She told ED staff that she took valium and tramadol prior to her dialysis session, but it is unclear how much. Other  possible causes of encephalopathy were considered.  Less likely infectious.  She is afebrile with no leukocytosis, no respiratory symptoms, satting fine on room air, but unable to assess urinary symptoms given somnolence.  Less likely metabolic. Electrolytes are within normal limits. LFTs, ammonia, blood glucose were unremarkable, recent TSH wnl. BUN 17, so not uremic. Less likely structural given negative head CT. Alcohol level wnl, other toxins possible. I expect her mental status to improve overnight as the medications are cleared. We considered trying to dialyze her, but gabapentin is the only centrally acting medication on her discharge medication list that is able to be dialyzed off. She did have deficits on the neuro exam in her RLE, but they are inconsistent and were difficult to assess given her hypersomnolence and TD; for example, she was able to move her right foot on command, but could only minimally bend her right knee. With a negative CT and no other corresponding deficits, low concern for stroke at this time.  - Holding all medications in the setting of altered mental status - Frequent neurochecks - Urine drug screen pending - Cardiac monitoring - NPO  ESRD on HD, MWF Had a dialysis session today which ended early due to AMS. Recommend reaching out to nephrology in the AM to let them know she is here in case she needs dialysis since her Monday session was cut short.   Chronic conditions: HTN: Held amlodipine 10 mg Chronic diastolic heart failure: Held coreg 25 mg BID, hydralazine 50 mg TID, isosorbide dinitrate 20 mg TID CAD: held nitroglycerin prn Chronic hip and back pain: held tramadol 50 mg TID, gabapentin 100 mg TID in the setting of AMS.  Anemia of chronic disease: Hgb 8.6, around baslline, normocytic in the setting of ESRD. Ferritin 688. Iron 36, TIBC 260, Tsat 14.  Anxiety/Depression/Bipolar disorder: held fluoxetine 40 mg daily, diazepam 10 mg. Not on any mood stabilizers.   GERD: held protonix 40 mg daily Constipation: held senna Tardive Dyskinesia: secondary to prolonged abilify use. Held ingrezza 80 mg nightly in the setting of somnolence.  Diet: NPO VTE: Heparin IVF: None Code: Full (from recent admission)  Prior to Admission Living Arrangement: Home Anticipated Discharge Location: Home Barriers to Discharge: Resolution of altered mental status  Dispo: Admit patient  to Observation with expected length of stay less than 2 midnights.  Signed: Annett Fabian, MD Internal Medicine Resident, PGY-1 [facility]  Pager: [phone]  05/25/2023, 3:09 AM

## 2023-05-25 NOTE — ED Notes (Signed)
Patient resting in bed with eyes closed, no s/s of distress, will continue to monitor.  

## 2023-05-26 DIAGNOSIS — F419 Anxiety disorder, unspecified: Secondary | ICD-10-CM | POA: Diagnosis present

## 2023-05-26 DIAGNOSIS — G9340 Encephalopathy, unspecified: Secondary | ICD-10-CM | POA: Diagnosis not present

## 2023-05-26 DIAGNOSIS — G4710 Hypersomnia, unspecified: Secondary | ICD-10-CM | POA: Diagnosis present

## 2023-05-26 DIAGNOSIS — Z9049 Acquired absence of other specified parts of digestive tract: Secondary | ICD-10-CM | POA: Diagnosis not present

## 2023-05-26 DIAGNOSIS — Z992 Dependence on renal dialysis: Secondary | ICD-10-CM | POA: Diagnosis not present

## 2023-05-26 DIAGNOSIS — R4182 Altered mental status, unspecified: Secondary | ICD-10-CM | POA: Diagnosis present

## 2023-05-26 DIAGNOSIS — Z90710 Acquired absence of both cervix and uterus: Secondary | ICD-10-CM | POA: Diagnosis not present

## 2023-05-26 DIAGNOSIS — Z803 Family history of malignant neoplasm of breast: Secondary | ICD-10-CM | POA: Diagnosis not present

## 2023-05-26 DIAGNOSIS — G8929 Other chronic pain: Secondary | ICD-10-CM | POA: Diagnosis present

## 2023-05-26 DIAGNOSIS — F319 Bipolar disorder, unspecified: Secondary | ICD-10-CM | POA: Diagnosis present

## 2023-05-26 DIAGNOSIS — Z79899 Other long term (current) drug therapy: Secondary | ICD-10-CM | POA: Diagnosis not present

## 2023-05-26 DIAGNOSIS — D631 Anemia in chronic kidney disease: Secondary | ICD-10-CM | POA: Diagnosis present

## 2023-05-26 DIAGNOSIS — I132 Hypertensive heart and chronic kidney disease with heart failure and with stage 5 chronic kidney disease, or end stage renal disease: Secondary | ICD-10-CM | POA: Diagnosis present

## 2023-05-26 DIAGNOSIS — N186 End stage renal disease: Secondary | ICD-10-CM | POA: Diagnosis present

## 2023-05-26 DIAGNOSIS — Z87891 Personal history of nicotine dependence: Secondary | ICD-10-CM | POA: Diagnosis not present

## 2023-05-26 DIAGNOSIS — Z8673 Personal history of transient ischemic attack (TIA), and cerebral infarction without residual deficits: Secondary | ICD-10-CM | POA: Diagnosis not present

## 2023-05-26 DIAGNOSIS — G2581 Restless legs syndrome: Secondary | ICD-10-CM | POA: Diagnosis present

## 2023-05-26 DIAGNOSIS — T40425A Adverse effect of tramadol, initial encounter: Secondary | ICD-10-CM | POA: Diagnosis present

## 2023-05-26 DIAGNOSIS — Z880 Allergy status to penicillin: Secondary | ICD-10-CM | POA: Diagnosis not present

## 2023-05-26 DIAGNOSIS — Z8249 Family history of ischemic heart disease and other diseases of the circulatory system: Secondary | ICD-10-CM | POA: Diagnosis not present

## 2023-05-26 DIAGNOSIS — G928 Other toxic encephalopathy: Secondary | ICD-10-CM | POA: Diagnosis present

## 2023-05-26 DIAGNOSIS — K219 Gastro-esophageal reflux disease without esophagitis: Secondary | ICD-10-CM | POA: Diagnosis present

## 2023-05-26 DIAGNOSIS — I2510 Atherosclerotic heart disease of native coronary artery without angina pectoris: Secondary | ICD-10-CM | POA: Diagnosis present

## 2023-05-26 DIAGNOSIS — I5032 Chronic diastolic (congestive) heart failure: Secondary | ICD-10-CM | POA: Diagnosis present

## 2023-05-26 DIAGNOSIS — T424X5A Adverse effect of benzodiazepines, initial encounter: Secondary | ICD-10-CM | POA: Diagnosis present

## 2023-05-26 DIAGNOSIS — Z853 Personal history of malignant neoplasm of breast: Secondary | ICD-10-CM | POA: Diagnosis not present

## 2023-05-26 LAB — CBC
HCT: 25.3 % — ABNORMAL LOW (ref 36.0–46.0)
Hemoglobin: 8.1 g/dL — ABNORMAL LOW (ref 12.0–15.0)
MCH: 28.8 pg (ref 26.0–34.0)
MCHC: 32 g/dL (ref 30.0–36.0)
MCV: 90 fL (ref 80.0–100.0)
Platelets: 185 10*3/uL (ref 150–400)
RBC: 2.81 MIL/uL — ABNORMAL LOW (ref 3.87–5.11)
RDW: 15.8 % — ABNORMAL HIGH (ref 11.5–15.5)
WBC: 4.7 10*3/uL (ref 4.0–10.5)
nRBC: 0 % (ref 0.0–0.2)

## 2023-05-26 LAB — RENAL FUNCTION PANEL
Albumin: 2.9 g/dL — ABNORMAL LOW (ref 3.5–5.0)
Anion gap: 11 (ref 5–15)
BUN: 26 mg/dL — ABNORMAL HIGH (ref 8–23)
CO2: 25 mmol/L (ref 22–32)
Calcium: 9.3 mg/dL (ref 8.9–10.3)
Chloride: 101 mmol/L (ref 98–111)
Creatinine, Ser: 5.09 mg/dL — ABNORMAL HIGH (ref 0.44–1.00)
GFR, Estimated: 9 mL/min — ABNORMAL LOW (ref >60)
Glucose, Bld: 95 mg/dL (ref 70–99)
Phosphorus: 6.2 mg/dL — ABNORMAL HIGH (ref 2.5–4.6)
Potassium: 3.7 mmol/L (ref 3.5–5.1)
Sodium: 137 mmol/L (ref 135–145)

## 2023-05-26 LAB — MAGNESIUM: Magnesium: 2 mg/dL (ref 1.7–2.4)

## 2023-05-26 MED ORDER — HEPARIN SODIUM (PORCINE) 1000 UNIT/ML DIALYSIS
1000.0000 [IU] | INTRAMUSCULAR | Status: DC | PRN
Start: 2023-05-26 — End: 2023-05-26
  Administered 2023-05-26: 1000 [IU]

## 2023-05-26 MED ORDER — NEPRO/CARBSTEADY PO LIQD
237.0000 mL | ORAL | Status: DC | PRN
Start: 2023-05-26 — End: 2023-05-26

## 2023-05-26 MED ORDER — HEPARIN SODIUM (PORCINE) 1000 UNIT/ML DIALYSIS
1500.0000 [IU] | INTRAMUSCULAR | Status: AC | PRN
  Administered 2023-05-26: 1500 [IU] via INTRAVENOUS_CENTRAL

## 2023-05-26 MED ORDER — TRAMADOL HCL 50 MG PO TABS
50.0000 mg | ORAL_TABLET | Freq: Once | ORAL | Status: AC
  Administered 2023-05-26: 50 mg via ORAL
  Filled 2023-05-26: qty 1

## 2023-05-26 MED ORDER — LIDOCAINE HCL (PF) 1 % IJ SOLN
5.0000 mL | INTRAMUSCULAR | Status: DC | PRN
Start: 2023-05-26 — End: 2023-05-26

## 2023-05-26 MED ORDER — ACETAMINOPHEN 500 MG PO TABS
1000.0000 mg | ORAL_TABLET | Freq: Three times a day (TID) | ORAL | Status: DC
  Administered 2023-05-26 (×2): 1000 mg via ORAL
  Filled 2023-05-26 (×3): qty 2

## 2023-05-26 MED ORDER — PENTAFLUOROPROP-TETRAFLUOROETH EX AERO
1.0000 | INHALATION_SPRAY | CUTANEOUS | Status: DC | PRN
Start: 2023-05-26 — End: 2023-05-26

## 2023-05-26 MED ORDER — ANTICOAGULANT SODIUM CITRATE 4% (200MG/5ML) IV SOLN
5.0000 mL | Status: DC | PRN
Start: 2023-05-26 — End: 2023-05-26
  Filled 2023-05-26: qty 5

## 2023-05-26 MED ORDER — LIDOCAINE-PRILOCAINE 2.5-2.5 % EX CREA
1.0000 | TOPICAL_CREAM | CUTANEOUS | Status: DC | PRN
Start: 2023-05-26 — End: 2023-05-26

## 2023-05-26 MED ORDER — HEPARIN SODIUM (PORCINE) 1000 UNIT/ML DIALYSIS
3000.0000 [IU] | Freq: Once | INTRAMUSCULAR | Status: AC
  Administered 2023-05-26: 3000 [IU] via INTRAVENOUS_CENTRAL

## 2023-05-26 MED ORDER — ALTEPLASE 2 MG IJ SOLR: 2.0000 mg | Freq: Once | INTRAMUSCULAR | Status: DC | PRN

## 2023-05-26 MED ORDER — HEPARIN SODIUM (PORCINE) 1000 UNIT/ML IJ SOLN
INTRAMUSCULAR | Status: AC
  Administered 2023-05-26: 1000 [IU]
  Filled 2023-05-26: qty 4

## 2023-05-26 NOTE — Progress Notes (Signed)
   05/26/23 2043  Vitals  Temp 97.8 F (36.6 C)  Pulse Rate 78  Resp (!) 22  BP (!) 156/59  SpO2 100 %  O2 Device Room Air  Weight (S)  69.3 kg (Bed Scale)  Type of Weight Post-Dialysis  Oxygen Therapy  Patient Activity (if Appropriate) In bed  Pulse Oximetry Type Continuous  Post Treatment  Dialyzer Clearance Clear  Hemodialysis Intake (mL) 0 mL  Liters Processed 71.9  Fluid Removed (mL) 2000 mL  Tolerated HD Treatment Yes  Post-Hemodialysis Comments Pt. intermittent screaming out, take me off the machine and disturbing the other HD pt around her. UF goal met without difficulties. Admin Medication as prescribe.   Received patient in bed to unit.  Alert and oriented.  Informed consent signed and in chart.   TX duration:3  Patient tolerated well.  Transported back to the room  Alert, without acute distress.  Hand-off given to patient's nurse.   Access used: RPC used without difficulties Access issues: No  Total UF removed: 2000 Medication(s) given: Heparin both ports 1.6cc, Heparin given 3000 bolus, 1500 midrun Post HD VS: See Above Grid Post HD weight: 69.3 kg   [facility]

## 2023-05-26 NOTE — Care Management Obs Status (Signed)
MEDICARE OBSERVATION STATUS NOTIFICATION   Patient Details  Name: Nymeria Mahaney MRN: [id number] Date of Birth: March 06, 1956   Medicare Observation Status Notification Given:       Gordy Clement, RN 05/26/2023, 11:32 AM

## 2023-05-26 NOTE — Discharge Summary (Incomplete)
Name: Sherry [name] MRN: [id number] DOB: 1955/09/25 67 y.o. PCP: Pcp, No  Date of Admission: 05/24/2023  3:00 PM Date of Discharge: 05/27/2023 8:45 AM Attending Physician: Dr. Heide Spark  Discharge Diagnosis: Active Problems:   * No active hospital problems. *    Discharge Medications: Allergies as of 05/27/2023       Reactions   Abilify [aripiprazole] Other (See Comments)   Tardive dyskinesia Oral   Remeron [mirtazapine] Other (See Comments)   Wgt stimulation /gain, Dizziness, Patient says "can tolerate"   Trazodone And Nefazodone Other (See Comments)   Nightmares/sleep diturbance   Flexeril [cyclobenzaprine] Other (See Comments)   Pt states Flexeril makes her feel depressed    Amoxicillin Diarrhea        Medication List     TAKE these medications    amLODipine 10 MG tablet Commonly known as: NORVASC Take 1 tablet by mouth daily.   carvedilol 12.5 MG tablet Commonly known as: COREG Take 25 mg by mouth 2 (two) times daily.   diazepam 10 MG tablet Commonly known as: VALIUM Take 0.5 tablets (5 mg total) by mouth as directed. What changed: how much to take   hydrALAZINE 50 MG tablet Commonly known as: APRESOLINE Take 50 mg by mouth 3 (three) times daily.   pantoprazole 40 MG tablet Commonly known as: Protonix Take 1 tablet (40 mg total) by mouth daily.   senna-docusate 8.6-50 MG tablet Commonly known as: Senokot-S Take 1 tablet by mouth at bedtime as needed for mild constipation.   sertraline 25 MG tablet Commonly known as: ZOLOFT Take 25 mg by mouth daily.   traMADol 50 MG tablet Commonly known as: ULTRAM Take 50-100 mg by mouth 3 (three) times daily as needed for severe pain.        Disposition and follow-up:   Sherry [name] was discharged from [facility] in Stable condition.  At the hospital follow up visit please address:  1.  Follow-up:  *Acute encephalopathy likely secondary to polypharmacy  *ESRD on HD MWF - Please  ensure patient is complaint to her dialysis schedule  - Her Diazepam dosed reduced from 10 to 5 md Daily. Please assess her and modify dose if needed  -Gabapentin was discontinued - Monitor RFP   *Pain management *Bilateral hip and lower back pain *Metabolic encephalopathy *Polypharmacy and hx of polysubstance abuse - Please assess pain level and optimize pain control appropriately  - Recommend continual medication reconciliation as needed     2.  Labs / imaging needed at time of follow-up: CBC, RFP  3.  Pending labs/ test needing follow-up: Repeat sleep study   4.  Medication Changes    MODIFIED  - Valium decreased from 10 mg  to 5 mg on HD days             - Gabapentin was discontinued  Follow-up Appointments:   Hospital Course by problem list:  Acute encephalopathy likely secondary to polypharmacy  ESRD on HD MWF Patient presented from her hemodialysis session due to concerns for altered mental status likely thought to be secondary to medication ingestion.  Patient had presented to [place] a couple days ago with similar presentation and discharged for multiple sessions of dialysis.  It was suspected that her centrally acting medication was inducing this encephalopathy.  Patient was dialyzed during this hospitalization and she got much better.  Her home medication was fully reconciled with pharmacy and adjusted appropriately.  Patient was recommended to decrease her diazepam to 5  mg and discontinue her gabapentin until she sees her primary care physician for further medication adjustment. Multiple attempts made to reach PCP,Dr. Malen Gauze at [facility] but no no avail. Patient is advised to schedule an appointment with her PCP in 1-2 weeks after discharge   #HTN We continued her home antihypertensives .Discharged on home antihypertensives. Will need monitoring at follow up at outpatient HD and with PCP.   #Anemia secondary to CKD Stable.Hg was monitored during this  hospitalization. ESA on 11/17 outpatient   Discharge Subjective: Patient was seen at bedside, well awake and eating her breakfast. She didn't have any complaints. She denied any chest pain or pain any where, denied shortness of breath,denied any abdominal pain. Concerns about polypharmacy induced encephalopathy adequately discussed with the patient. Patient is agreeable to plan to discharge.   Discharge Exam:   Blood pressure (!) 162/72, pulse 70, temperature 98.1 F (36.7 C), temperature source Oral, resp. rate 16, height 5\' 2"  (1.575 m), weight 72 kg, SpO2 99%.  Constitutional: Well-appearing woman,very much awake today,sitting on bed, in no acute distress HENT: normocephalic atraumatic, mucous membranes moist Cardiovascular: regular rate and rhythm, no m/r/g,  Pulmonary/Chest: normal work of breathing on room air, lungs clear to auscultation bilaterally.  Abdominal: soft, non-tender, non-distended. No fluid wave and no asterixis Neurological: alert & oriented x 3 MSK: no gross abnormalities. Skin: warm and dry Psych: Normal mood and affect  Pertinent Labs, Studies, and Procedures:     Latest Ref Rng & Units 05/26/2023    3:18 AM 05/25/2023    2:12 AM 05/25/2023    2:07 AM  CBC  WBC 4.0 - 10.5 K/uL 4.7   4.6   Hemoglobin 12.0 - 15.0 g/dL 8.1  8.2  7.7   Hematocrit 36.0 - 46.0 % 25.3  24.0  25.5   Platelets 150 - 400 K/uL 185   172        Latest Ref Rng & Units 05/26/2023    3:18 AM 05/25/2023    2:12 AM 05/25/2023    2:07 AM  CMP  Glucose 70 - 99 mg/dL 95   83   BUN 8 - 23 mg/dL 26   19   Creatinine 1.61 - 1.00 mg/dL 0.96   0.45   Sodium 409 - 145 mmol/L 137  138  138   Potassium 3.5 - 5.1 mmol/L 3.7  3.9  3.9   Chloride 98 - 111 mmol/L 101   100   CO2 22 - 32 mmol/L 25   28   Calcium 8.9 - 10.3 mg/dL 9.3   9.2   Total Protein 6.5 - 8.1 g/dL   6.0   Total Bilirubin <1.2 mg/dL   0.4   Alkaline Phos 38 - 126 U/L   65   AST 15 - 41 U/L   12   ALT 0 - 44 U/L   9      CT Head Wo Contrast  Result Date: 05/25/2023 CLINICAL DATA:  Altered mental status. EXAM: CT HEAD WITHOUT CONTRAST TECHNIQUE: Contiguous axial images were obtained from the base of the skull through the vertex without intravenous contrast. RADIATION DOSE REDUCTION: This exam was performed according to the departmental dose-optimization program which includes automated exposure control, adjustment of the mA and/or kV according to patient size and/or use of iterative reconstruction technique. COMPARISON:  May 21, 2023 FINDINGS: Brain: There is mild cerebral atrophy with widening of the extra-axial spaces and ventricular dilatation. There are areas of decreased attenuation within the  white matter tracts of the supratentorial brain, consistent with microvascular disease changes. Vascular: Marked severity bilateral cavernous carotid artery calcification is noted. Skull: Normal. Negative for fracture or focal lesion. Sinuses/Orbits: No acute finding. Other: None. IMPRESSION: 1. Generalized cerebral atrophy with chronic white matter small vessel ischemic changes. 2. No acute intracranial abnormality. Electronically Signed   By: Aram Candela M.D.   On: 05/25/2023 02:57     Discharge Instructions: Discharge Instructions     Diet - low sodium heart healthy   Complete by: As directed    Increase activity slowly   Complete by: As directed        Sherry [name]   You were brought to the hospital from your dialysis clinic because you were very sleepy , confused and not answering question appropriately. You were admitted a couple of days ago with similar presentation. We think your medications is causing this sleepy and confused episodes.PLEASE take note of these medication changes until you see your primary care physician.  1.Please make an appointment to see Dr. Malen Gauze at [facility] in the next week; you will need to have your medications adjusted by him 2.For pain: 3.Continue your  tramadol 50 mg (1 pill) up to 3 times per day for pain 4.Stop taking gabapentin. 5.Decrease valium to 5 mg (1/2 tablet) only before dialysis.  Please continue taking all of your other scheduled and prescribed medications for your chronic medical conditions. Please ensure you don't miss your dialysis sessions.   If you have any questions or feel your symptoms returning or worsening and cannot be seen by your primary care doctor during the regular office hours, return to the emergency department at the urgent care for medical attention.  Take care,  Dr. Kathleen Lime, MD    Signed: Kathleen Lime, MD [facility] - PGY1 Pager: [phone] 05/27/2023, 8:45 AM    Please contact the on call pager after 5 pm and on weekends at [phone].

## 2023-05-26 NOTE — Care Management Obs Status (Signed)
MEDICARE OBSERVATION STATUS NOTIFICATION   Patient Details  Name: Sherry [name] MRN: [id number] Date of Birth: March 06, 1956   Medicare Observation Status Notification Given:       Gordy Clement, RN 05/26/2023, 11:32 AM

## 2023-05-26 NOTE — Plan of Care (Signed)
  Problem: Education: Goal: Knowledge of General Education information will improve Description: Including pain rating scale, medication(s)/side effects and non-pharmacologic comfort measures 05/26/2023 1033 by Ellison Carwin, RN Outcome: Progressing 05/26/2023 1032 by Ellison Carwin, RN Outcome: Progressing   Problem: Health Behavior/Discharge Planning: Goal: Ability to manage health-related needs will improve 05/26/2023 1033 by Ellison Carwin, RN Outcome: Progressing 05/26/2023 1032 by Ellison Carwin, RN Outcome: Progressing   Problem: Clinical Measurements: Goal: Ability to maintain clinical measurements within normal limits will improve 05/26/2023 1033 by Ellison Carwin, RN Outcome: Progressing 05/26/2023 1032 by Ellison Carwin, RN Outcome: Progressing Goal: Will remain free from infection 05/26/2023 1033 by Ellison Carwin, RN Outcome: Progressing 05/26/2023 1032 by Ellison Carwin, RN Outcome: Progressing Goal: Diagnostic test results will improve 05/26/2023 1033 by Ellison Carwin, RN Outcome: Progressing 05/26/2023 1032 by Ellison Carwin, RN Outcome: Progressing Goal: Respiratory complications will improve 05/26/2023 1033 by Ellison Carwin, RN Outcome: Progressing 05/26/2023 1032 by Ellison Carwin, RN Outcome: Progressing Goal: Cardiovascular complication will be avoided 05/26/2023 1033 by Ellison Carwin, RN Outcome: Progressing 05/26/2023 1032 by Ellison Carwin, RN Outcome: Progressing   Problem: Activity: Goal: Risk for activity intolerance will decrease 05/26/2023 1033 by Ellison Carwin, RN Outcome: Progressing 05/26/2023 1032 by Ellison Carwin, RN Outcome: Progressing   Problem: Nutrition: Goal: Adequate nutrition will be maintained 05/26/2023 1033 by Ellison Carwin, RN Outcome: Progressing 05/26/2023 1032 by Ellison Carwin, RN Outcome: Progressing   Problem: Coping: Goal: Level of anxiety will decrease 05/26/2023  1033 by Ellison Carwin, RN Outcome: Progressing 05/26/2023 1032 by Ellison Carwin, RN Outcome: Progressing   Problem: Elimination: Goal: Will not experience complications related to bowel motility 05/26/2023 1033 by Ellison Carwin, RN Outcome: Progressing 05/26/2023 1032 by Ellison Carwin, RN Outcome: Progressing Goal: Will not experience complications related to urinary retention 05/26/2023 1033 by Ellison Carwin, RN Outcome: Progressing 05/26/2023 1032 by Ellison Carwin, RN Outcome: Progressing   Problem: Pain Management: Goal: General experience of comfort will improve 05/26/2023 1033 by Ellison Carwin, RN Outcome: Progressing 05/26/2023 1032 by Ellison Carwin, RN Outcome: Progressing   Problem: Safety: Goal: Ability to remain free from injury will improve 05/26/2023 1033 by Ellison Carwin, RN Outcome: Progressing 05/26/2023 1032 by Ellison Carwin, RN Outcome: Progressing   Problem: Skin Integrity: Goal: Risk for impaired skin integrity will decrease 05/26/2023 1033 by Ellison Carwin, RN Outcome: Progressing 05/26/2023 1032 by Ellison Carwin, RN Outcome: Progressing

## 2023-05-26 NOTE — TOC Transition Note (Signed)
Transition of Care [facility]) - CM/SW Discharge Note   Patient Details  Name: Sherry [name] MRN: [id number] Date of Birth: July 12, 1956  [facility] of [facility]) CM/SW Contact:  Mearl Latin, LCSW Phone Number: 05/26/2023, 4:44 PM   Clinical Narrative:    CSW received request for assistance with transportation home. Rn not comfortable with patient taking taxi at her current level so CSW provided PTAR packet on hard chart and RN to call [phone] once patient returns from dialysis to take patient home. Rollator present in room.    Final next level of care: Home/Self Care Barriers to Discharge: Barriers Resolved   Patient Goals and CMS Choice      Discharge Placement                      Patient and family notified of of transfer: 05/26/23  Discharge Plan and Services Additional resources added to the After Visit Summary for                                       Social Determinants of Health (SDOH) Interventions SDOH Screenings   Food Insecurity: No Food Insecurity (05/25/2023)  Housing: Low Risk  (05/25/2023)  Transportation Needs: No Transportation Needs (05/25/2023)  Utilities: Not At Risk (05/25/2023)  Alcohol Screen: Medium Risk (08/27/2017)  Depression (PHQ2-9): Medium Risk (12/20/2020)  Tobacco Use: High Risk (05/24/2023)     Readmission Risk Interventions     No data to display

## 2023-05-26 NOTE — Progress Notes (Signed)
   05/26/23 1517  TOC Brief Assessment  Insurance and Status Reviewed ([employer] HMO)  Patient has primary care physician Yes Anselm Lis MD)  Home environment has been reviewed From Home  Prior level of function: Independent   OPHD  Prior/Current Home Services No current home services  Social Determinants of Health Reivew SDOH reviewed interventions complete (Smoking cessation information attached to DC instructions)  Readmission risk has been reviewed Yes (N/A listed)  Transition of care needs no transition of care needs at this time   No TOC needs at this time. Please place consult if TOC needs arise

## 2023-05-26 NOTE — Progress Notes (Signed)
[facility] Progress Note   Subjective:   Pt alert, says she feels well and wants to go home today. Denies SOB, CP, dizziness, nausea. We discussed not taking sedating meds pre-HD and to only take her medications as prescribed.   Objective Vitals:   05/25/23 1912 05/25/23 2036 05/26/23 0011 05/26/23 0624  BP:  (!) 140/44 (!) 142/77 (!) 146/80  Pulse:   77 73  Resp: 20  18 16   Temp: 97.8 F (36.6 C)  97.8 F (36.6 C) 97.9 F (36.6 C)  TempSrc: Oral  Oral Oral  SpO2:   97% 97%  Weight: 72 kg     Height: 5\' 2"  (1.575 m)      Physical Exam General: Alert female in NAD Heart: RRR, no murmurs, rubs or gallops Lungs: CTA bilaterally, respirations unlabored Abdomen: Soft, non-distended, +BS Extremities: No edema b/l lower extremities Dialysis Access: LUE AVF + T/b  Additional Objective Labs: Basic Metabolic Panel: Recent Labs  Lab 05/23/23 0236 05/24/23 1540 05/24/23 2309 05/25/23 0207 05/25/23 0212 05/26/23 0318  NA 136 141   < > 138 138 137  K 3.3* 3.8   < > 3.9 3.9 3.7  CL 100 107  --  100  --  101  CO2 28 19*  --  28  --  25  GLUCOSE 118* 75  --  83  --  95  BUN 21 17  --  19  --  26*  CREATININE 2.66* 2.89*  --  3.48*  --  5.09*  CALCIUM 9.0 8.8*  --  9.2  --  9.3  PHOS 4.1  --   --  5.1*  --  6.2*   < > = values in this interval not displayed.   Liver Function Tests: Recent Labs  Lab 05/21/23 1412 05/21/23 2100 05/24/23 1540 05/25/23 0207 05/26/23 0318  AST 12*  --  14* 12*  --   ALT 11  --  8 9  --   ALKPHOS 61  --  68 65  --   BILITOT 1.0  --  0.4 0.4  --   PROT 6.1*  --  6.9 6.0*  --   ALBUMIN 3.0*   < > 3.5 2.9* 2.9*   < > = values in this interval not displayed.   Recent Labs  Lab 05/24/23 1540  LIPASE 38   CBC: Recent Labs  Lab 05/21/23 1054 05/21/23 2100 05/22/23 0704 05/23/23 0236 05/24/23 1540 05/24/23 2309 05/25/23 0207 05/25/23 0212 05/26/23 0318  WBC 6.3   < > 3.8* 4.0 5.5  --  4.6  --  4.7  NEUTROABS 4.7   --   --   --  3.0  --   --   --   --   HGB 8.6*   < > 8.4* 7.8* 8.6*   < > 7.7* 8.2* 8.1*  HCT 29.5*   < > 27.5* 25.5* 29.6*   < > 25.5* 24.0* 25.3*  MCV 95.8   < > 90.8 89.2 94.0  --  91.7  --  90.0  PLT 217   < > 178 157 PLATELET CLUMPS NOTED ON SMEAR, UNABLE TO ESTIMATE  --  172  --  185   < > = values in this interval not displayed.   Blood Culture    Component Value Date/Time   SDES  08/18/2017 1832    URINE, CLEAN CATCH Performed at [facility], [address] [place], [place] [address]  SPECREQUEST  08/18/2017 1832    Normal Performed at [facility] [place], [address] [place], [place] [address]    CULT MULTIPLE SPECIES PRESENT, SUGGEST RECOLLECTION (A) 08/18/2017 1832   REPTSTATUS 08/20/2017 FINAL 08/18/2017 1832    Cardiac Enzymes: No results for input(s): "CKTOTAL", "CKMB", "CKMBINDEX", "TROPONINI" in the last 168 hours. CBG: Recent Labs  Lab 05/22/23 0548 05/23/23 0525 05/24/23 2021 05/25/23 0224  GLUCAP 76 90 86 74   Iron Studies: No results for input(s): "IRON", "TIBC", "TRANSFERRIN", "FERRITIN" in the last 72 hours. @lablastinr3 @ Studies/Results: CT Head Wo Contrast  Result Date: 05/25/2023 CLINICAL DATA:  Altered mental status. EXAM: CT HEAD WITHOUT CONTRAST TECHNIQUE: Contiguous axial images were obtained from the base of the skull through the vertex without intravenous contrast. RADIATION DOSE REDUCTION: This exam was performed according to the departmental dose-optimization program which includes automated exposure control, adjustment of the mA and/or kV according to patient size and/or use of iterative reconstruction technique. COMPARISON:  May 21, 2023 FINDINGS: Brain: There is mild cerebral atrophy with widening of the extra-axial spaces and ventricular dilatation. There are areas of decreased attenuation within the white matter tracts of the supratentorial brain, consistent with microvascular disease changes.  Vascular: Marked severity bilateral cavernous carotid artery calcification is noted. Skull: Normal. Negative for fracture or focal lesion. Sinuses/Orbits: No acute finding. Other: None. IMPRESSION: 1. Generalized cerebral atrophy with chronic white matter small vessel ischemic changes. 2. No acute intracranial abnormality. Electronically Signed   By: Aram Candela M.D.   On: 05/25/2023 02:57   Medications:   acetaminophen  1,000 mg Oral Q8H   Chlorhexidine Gluconate Cloth  6 each Topical Q0600   diazepam  10 mg Oral Daily   heparin  5,000 Units Subcutaneous Q8H   hydrALAZINE  50 mg Oral Q8H   pantoprazole  40 mg Oral Daily    Dialysis Orders: SW MWF  3h   67.1kg  2/2 bath  450/500  Heparin 3500   - pending - last inpatient HD was 11/08 and 11/09, dc'd on 11/10  Assessment/Plan: AMS - per pmd most likely polypharmacy again. Took multiple sedating meds prior to HD and in the past has been educated about the risks of being sedated while on dialysis. Discussed again today. Per pmd.  ESKD - on HD MWF. Labs and vol stable. Plan HD today HTN - cont home meds.  Volume - no gross vol overload on exam but weight is up and she tends to have large fluid gains. On RA. UF 2-3 L w/ HD today.  Anemia of eskd - Hb 7- 9 here. Follow.  MBD ckd - CCa in range. Phos elevated. Continue VDRA Psych - hx anxiety/ depression/ bipolar and tardive dyskinesia. Per pmd.       Rogers Blocker, PA-C 05/26/2023, 9:15 AM  [facility] Pager: [phone]

## 2023-05-26 NOTE — Plan of Care (Signed)
  Problem: Education: Goal: Knowledge of General Education information will improve Description: Including pain rating scale, medication(s)/side effects and non-pharmacologic comfort measures Outcome: Progressing   Problem: Health Behavior/Discharge Planning: Goal: Ability to manage health-related needs will improve Outcome: Progressing   Problem: Clinical Measurements: Goal: Ability to maintain clinical measurements within normal limits will improve Outcome: Progressing   Problem: Activity: Goal: Risk for activity intolerance will decrease Outcome: Progressing   Problem: Coping: Goal: Level of anxiety will decrease Outcome: Progressing   Problem: Pain Management: Goal: General experience of comfort will improve Outcome: Progressing   Problem: Safety: Goal: Ability to remain free from injury will improve Outcome: Progressing

## 2023-05-26 NOTE — Progress Notes (Signed)
   HD#0 SUBJECTIVE:  Patient Summary: Sherry [name] is a 67 y.o. with a pertinent PMH of ESRD on HD MWF,Depression, Anxiety, HTN  who presented from her HD clinic due to altered mental status  and admitted for acute encephalopathy likely medications induced.  Overnight Events: NAOE  Interim History: Patient was seen at bedside, well awake and eating her breakfast. She didn't have any complaints. She denied any chest pain or pain any where, denied shortness of breath,denied any abdominal pain. Concerns about polypharmacy induced encephalopathy adequately discussed with the patient. She reported she stopped taking her Gabapentin after his last admission.  OBJECTIVE:  Vital Signs: Vitals:   05/25/23 2036 05/26/23 0011 05/26/23 0624 05/26/23 0921  BP: (!) 140/44 (!) 142/77 (!) 146/80 (!) 144/113  Pulse:  77 73 70  Resp:  18 16 16   Temp:  97.8 F (36.6 C) 97.9 F (36.6 C) 97.8 F (36.6 C)  TempSrc:  Oral Oral Oral  SpO2:  97% 97% 99%  Weight:      Height:       Supplemental O2: Room Air SpO2: 99 %  Filed Weights   05/25/23 1912  Weight: 72 kg     Intake/Output Summary (Last 24 hours) at 05/26/2023 1128 Last data filed at 05/25/2023 2000 Gross per 24 hour  Intake 360 ml  Output --  Net 360 ml   Net IO Since Admission: 60 mL [05/26/23 1128]  Physical Exam: Physical Exam Constitutional:      General: She is not in acute distress.    Appearance: She is not ill-appearing, toxic-appearing or diaphoretic.     Comments: More awake today compared with yesterday   Cardiovascular:     Rate and Rhythm: Normal rate and regular rhythm.  Pulmonary:     Effort: Pulmonary effort is normal.  Neurological:     Mental Status: She is alert.     Patient Lines/Drains/Airways Status     Active Line/Drains/Airways     Name Placement date Placement time Site Days   Peripheral IV 05/25/23 20 G Anterior;Distal;Right;Upper Arm 05/25/23  0159  Arm  1   Hemodialysis Catheter Right  Subclavian --  --  Subclavian  --   External Urinary Catheter 05/25/23  2210  --  1             ASSESSMENT/PLAN:  Assessment: Principal Problem:   Acute encephalopathy  Plan: #Acute encephalopathy 2/2 to Polypharmacy Patient was more awake today and eating her breakfast and answering questions appropriately.  - HD today - Reassess after dialysis   #End Stage Renal Disease on HD MWF - HD today  - F/U Nephrology recs    #Chronic Conditions   HTN:  Continue home antihypertensive regimen Anemia of CKD: - Hgb 8.1. Stable. Will continue to monitor Anxiety/Bipolar Disorder/Depression- Continue Prozac GERD:  Continue pantoprazole  Chronic Pain : On tramadol 50 mg BID   Best Practice: Diet: Renal diet VTE: heparin injection 5,000 Units Start: 05/25/23 0600 Code: Full DISPO: Anticipated discharge tomorrow to Home pending  inpatient HD  Signature: Kathleen Lime , MD Internal Medicine Resident, PGY-1 [facility]  Pager: [phone] 11:28 AM, 05/26/2023   Please contact the on call pager after 5 pm and on weekends at [phone].

## 2023-05-26 NOTE — Progress Notes (Signed)
Pt receives out-pt HD at [facility] [place] on MWF. Will assist as needed.   Olivia Canter Renal Navigator [phone]

## 2023-05-27 ENCOUNTER — Ambulatory Visit: Payer: Medicare HMO | Admitting: Orthopaedic Surgery

## 2023-05-27 DIAGNOSIS — G9340 Encephalopathy, unspecified: Secondary | ICD-10-CM | POA: Diagnosis not present

## 2023-05-27 MED ORDER — DIAZEPAM 10 MG PO TABS: 5.0000 mg | ORAL_TABLET | ORAL | Status: DC

## 2023-05-27 NOTE — Discharge Instructions (Signed)
Sherry [name]   You were brought to the hospital from your dialysis clinic because you were very sleepy , confused and not answering question appropriately. You were admitted a couple of days ago with similar presentation. We think your medications is causing this sleepy and confused episodes.PLEASE take note of these medication changes until you see your primary care physician.  1.Please make an appointment to see Dr. Malen Gauze at [facility] in the next week; you will need to have your medications adjusted by him 2.For pain: 3.Continue your tramadol 50 mg (1 pill) up to 3 times per day for pain 4.Stop taking gabapentin. 5.Decrease valium to 5 mg (1/2 tablet) only before dialysis.   Please continue taking all of your other scheduled and prescribed medications for your chronic medical conditions. Please ensure you don't miss your dialysis sessions.   If you have any questions or feel your symptoms returning or worsening and cannot be seen by your primary care doctor during the regular office hours, return to the emergency department at the urgent care for medical attention.  Take care,  Dr. Kathleen Lime, MD

## 2023-05-27 NOTE — Discharge Planning (Signed)
[facility] Patient Discharge Orders- [facility] CL[facility]: [place]  Patient's name: Sherry [name] Admit/DC Dates: 05/24/2023 - 05/27/2023  Discharge Diagnoses: Acute encephalopathy   ESRD  Aranesp: Given: No   Date and amount of last dose: N/A  Last Hgb: 8.1 PRBC's Given: No Date/# of units: N/A ESA dose for discharge: mircera 200 mcg IV q 2 weeks  IV Iron dose at discharge: none  Heparin change: no  EDW Change: no New EDW:   [place] Change: no  Access intervention/Change: no Details:  Hectorol/Calcitriol change: no  Discharge Labs: Calcium 9.3 Phosphorus 6.2 Albumin 2.9 K+ 3.7  IV Antibiotics: no Details:  On Coumadin?: no Last INR: Next INR: Managed By:   OTHER/APPTS/LAB ORDERS:    D/C Meds to be reconciled by nurse after every discharge.  Completed By: Rogers Blocker, PA-C 05/27/2023, 11:41 AM   [facility] Pager: [phone]    Reviewed by: MD:______ RN_______

## 2023-05-27 NOTE — Progress Notes (Signed)
Noted pt was d/c this am. Contacted [facility] to advise clinic of pt's d/c today and that pt should resume care tomorrow.   Olivia Canter Renal Navigator [phone]

## 2023-05-27 NOTE — Progress Notes (Signed)
Patient requesting to leave doing shift change. RN asked patient to wait until the rounding team come to see her but patient kept insisting on leaving. MD paged with the above information, came to bedside to see patient and put in d/c orders. Per patient her niece is coming to pick her up and asked to be taking down stairs to the emergency room entrance to wait for her niece. RN educated patient to wait for her niece in room because it is raining and cold outside but patient refused. So patient was taking down to the ED entrance with her belongings per her request to await her ride.

## 2023-05-30 ENCOUNTER — Telehealth: Payer: Self-pay | Admitting: Nephrology

## 2023-05-30 NOTE — Telephone Encounter (Signed)
Transition of Care - Initial Contact from Inpatient Facility  Date of discharge: 05/27/23 Date of contact: 05/30/23  Method: Phone Spoke to: Patient  Patient contacted to discuss transition of care from recent inpatient hospitalization. Patient was admitted to [facility] from 11/11-11/14/24... with discharge diagnosis of  .Marland KitchenMarland KitchenAcute encephalopathy likely secondary to polypharmacy  ESRD on HD MWF  The discharge medication list was reviewed. Patient understands the changes and has no concerns.    Patient will return to his/her outpatient HD unit on:   No other concerns at this time.

## 2023-06-17 ENCOUNTER — Ambulatory Visit: Payer: Medicare HMO

## 2023-06-17 ENCOUNTER — Ambulatory Visit (HOSPITAL_COMMUNITY): Payer: Medicare HMO

## 2023-07-15 ENCOUNTER — Encounter (HOSPITAL_COMMUNITY): Payer: Self-pay

## 2023-07-29 ENCOUNTER — Encounter (HOSPITAL_COMMUNITY): Payer: Self-pay

## 2023-08-05 ENCOUNTER — Ambulatory Visit (HOSPITAL_COMMUNITY): Admission: RE | Admit: 2023-08-05 | Payer: 59 | Source: Ambulatory Visit

## 2023-08-05 ENCOUNTER — Ambulatory Visit: Payer: Medicaid Other

## 2023-08-20 ENCOUNTER — Encounter (HOSPITAL_COMMUNITY): Payer: Self-pay

## 2023-08-27 ENCOUNTER — Encounter (HOSPITAL_COMMUNITY): Payer: Self-pay

## 2023-09-01 ENCOUNTER — Other Ambulatory Visit: Payer: Self-pay | Admitting: *Deleted

## 2023-09-01 DIAGNOSIS — T82590A Other mechanical complication of surgically created arteriovenous fistula, initial encounter: Secondary | ICD-10-CM

## 2023-09-01 DIAGNOSIS — N186 End stage renal disease: Secondary | ICD-10-CM

## 2023-09-02 ENCOUNTER — Ambulatory Visit (HOSPITAL_COMMUNITY): Payer: 59

## 2023-09-02 ENCOUNTER — Encounter (HOSPITAL_COMMUNITY): Payer: Self-pay

## 2023-09-16 ENCOUNTER — Ambulatory Visit: Payer: 59 | Admitting: Vascular Surgery

## 2023-09-30 ENCOUNTER — Encounter (HOSPITAL_COMMUNITY): Payer: Self-pay

## 2023-10-07 ENCOUNTER — Ambulatory Visit: Payer: 59 | Admitting: Vascular Surgery

## 2023-10-07 ENCOUNTER — Ambulatory Visit (HOSPITAL_COMMUNITY): Payer: 59

## 2023-10-08 DIAGNOSIS — N186 End stage renal disease: Secondary | ICD-10-CM | POA: Diagnosis not present

## 2023-10-08 DIAGNOSIS — G40909 Epilepsy, unspecified, not intractable, without status epilepticus: Secondary | ICD-10-CM

## 2023-10-08 DIAGNOSIS — J9601 Acute respiratory failure with hypoxia: Secondary | ICD-10-CM | POA: Diagnosis not present

## 2023-10-08 DIAGNOSIS — L89150 Pressure ulcer of sacral region, unstageable: Secondary | ICD-10-CM | POA: Diagnosis not present

## 2023-10-08 DIAGNOSIS — R5381 Other malaise: Secondary | ICD-10-CM | POA: Diagnosis not present

## 2023-10-09 DIAGNOSIS — G40909 Epilepsy, unspecified, not intractable, without status epilepticus: Secondary | ICD-10-CM

## 2023-10-09 DIAGNOSIS — J9601 Acute respiratory failure with hypoxia: Secondary | ICD-10-CM | POA: Diagnosis not present

## 2023-10-09 DIAGNOSIS — I5022 Chronic systolic (congestive) heart failure: Secondary | ICD-10-CM | POA: Diagnosis not present

## 2023-10-09 DIAGNOSIS — N186 End stage renal disease: Secondary | ICD-10-CM

## 2023-10-09 DIAGNOSIS — Z930 Tracheostomy status: Secondary | ICD-10-CM | POA: Diagnosis not present

## 2023-10-09 DIAGNOSIS — L89150 Pressure ulcer of sacral region, unstageable: Secondary | ICD-10-CM | POA: Diagnosis not present

## 2023-10-09 DIAGNOSIS — J9621 Acute and chronic respiratory failure with hypoxia: Secondary | ICD-10-CM | POA: Diagnosis not present

## 2023-10-09 DIAGNOSIS — R5381 Other malaise: Secondary | ICD-10-CM | POA: Diagnosis not present

## 2023-10-10 DIAGNOSIS — R5381 Other malaise: Secondary | ICD-10-CM | POA: Diagnosis not present

## 2023-10-10 DIAGNOSIS — I5022 Chronic systolic (congestive) heart failure: Secondary | ICD-10-CM | POA: Diagnosis not present

## 2023-10-10 DIAGNOSIS — L89150 Pressure ulcer of sacral region, unstageable: Secondary | ICD-10-CM | POA: Diagnosis not present

## 2023-10-10 DIAGNOSIS — J9601 Acute respiratory failure with hypoxia: Secondary | ICD-10-CM | POA: Diagnosis not present

## 2023-10-10 DIAGNOSIS — Z930 Tracheostomy status: Secondary | ICD-10-CM | POA: Diagnosis not present

## 2023-10-10 DIAGNOSIS — J9621 Acute and chronic respiratory failure with hypoxia: Secondary | ICD-10-CM | POA: Diagnosis not present

## 2023-10-10 DIAGNOSIS — N186 End stage renal disease: Secondary | ICD-10-CM | POA: Diagnosis not present

## 2023-10-11 DIAGNOSIS — I5022 Chronic systolic (congestive) heart failure: Secondary | ICD-10-CM | POA: Diagnosis not present

## 2023-10-11 DIAGNOSIS — J9601 Acute respiratory failure with hypoxia: Secondary | ICD-10-CM | POA: Diagnosis not present

## 2023-10-11 DIAGNOSIS — R5381 Other malaise: Secondary | ICD-10-CM | POA: Diagnosis not present

## 2023-10-11 DIAGNOSIS — J9621 Acute and chronic respiratory failure with hypoxia: Secondary | ICD-10-CM | POA: Diagnosis not present

## 2023-10-11 DIAGNOSIS — G40909 Epilepsy, unspecified, not intractable, without status epilepticus: Secondary | ICD-10-CM

## 2023-10-11 DIAGNOSIS — L89150 Pressure ulcer of sacral region, unstageable: Secondary | ICD-10-CM | POA: Diagnosis not present

## 2023-10-11 DIAGNOSIS — N186 End stage renal disease: Secondary | ICD-10-CM

## 2023-10-11 DIAGNOSIS — Z930 Tracheostomy status: Secondary | ICD-10-CM | POA: Diagnosis not present

## 2023-10-12 DIAGNOSIS — I5022 Chronic systolic (congestive) heart failure: Secondary | ICD-10-CM | POA: Diagnosis not present

## 2023-10-12 DIAGNOSIS — N186 End stage renal disease: Secondary | ICD-10-CM | POA: Diagnosis not present

## 2023-10-12 DIAGNOSIS — R5381 Other malaise: Secondary | ICD-10-CM | POA: Diagnosis not present

## 2023-10-12 DIAGNOSIS — L89150 Pressure ulcer of sacral region, unstageable: Secondary | ICD-10-CM | POA: Diagnosis not present

## 2023-10-12 DIAGNOSIS — J9621 Acute and chronic respiratory failure with hypoxia: Secondary | ICD-10-CM | POA: Diagnosis not present

## 2023-10-12 DIAGNOSIS — J9601 Acute respiratory failure with hypoxia: Secondary | ICD-10-CM | POA: Diagnosis not present

## 2023-10-12 DIAGNOSIS — Z930 Tracheostomy status: Secondary | ICD-10-CM | POA: Diagnosis not present

## 2023-10-13 ENCOUNTER — Encounter (HOSPITAL_COMMUNITY): Payer: Self-pay

## 2023-10-13 DIAGNOSIS — R5381 Other malaise: Secondary | ICD-10-CM | POA: Diagnosis not present

## 2023-10-13 DIAGNOSIS — I5022 Chronic systolic (congestive) heart failure: Secondary | ICD-10-CM | POA: Diagnosis not present

## 2023-10-13 DIAGNOSIS — J9601 Acute respiratory failure with hypoxia: Secondary | ICD-10-CM | POA: Diagnosis not present

## 2023-10-13 DIAGNOSIS — Z930 Tracheostomy status: Secondary | ICD-10-CM | POA: Diagnosis not present

## 2023-10-13 DIAGNOSIS — N186 End stage renal disease: Secondary | ICD-10-CM | POA: Diagnosis not present

## 2023-10-13 DIAGNOSIS — L89150 Pressure ulcer of sacral region, unstageable: Secondary | ICD-10-CM | POA: Diagnosis not present

## 2023-10-13 DIAGNOSIS — J9621 Acute and chronic respiratory failure with hypoxia: Secondary | ICD-10-CM

## 2023-10-14 DIAGNOSIS — Z930 Tracheostomy status: Secondary | ICD-10-CM | POA: Diagnosis not present

## 2023-10-14 DIAGNOSIS — R5381 Other malaise: Secondary | ICD-10-CM | POA: Diagnosis not present

## 2023-10-14 DIAGNOSIS — J9601 Acute respiratory failure with hypoxia: Secondary | ICD-10-CM | POA: Diagnosis not present

## 2023-10-14 DIAGNOSIS — L89150 Pressure ulcer of sacral region, unstageable: Secondary | ICD-10-CM | POA: Diagnosis not present

## 2023-10-14 DIAGNOSIS — I5022 Chronic systolic (congestive) heart failure: Secondary | ICD-10-CM | POA: Diagnosis not present

## 2023-10-14 DIAGNOSIS — N186 End stage renal disease: Secondary | ICD-10-CM | POA: Diagnosis not present

## 2023-10-14 DIAGNOSIS — J9621 Acute and chronic respiratory failure with hypoxia: Secondary | ICD-10-CM | POA: Diagnosis not present

## 2023-10-15 DIAGNOSIS — J9621 Acute and chronic respiratory failure with hypoxia: Secondary | ICD-10-CM | POA: Diagnosis not present

## 2023-10-15 DIAGNOSIS — R5381 Other malaise: Secondary | ICD-10-CM | POA: Diagnosis not present

## 2023-10-15 DIAGNOSIS — N186 End stage renal disease: Secondary | ICD-10-CM | POA: Diagnosis not present

## 2023-10-15 DIAGNOSIS — Z930 Tracheostomy status: Secondary | ICD-10-CM | POA: Diagnosis not present

## 2023-10-15 DIAGNOSIS — I5022 Chronic systolic (congestive) heart failure: Secondary | ICD-10-CM | POA: Diagnosis not present

## 2023-10-15 DIAGNOSIS — J9601 Acute respiratory failure with hypoxia: Secondary | ICD-10-CM | POA: Diagnosis not present

## 2023-10-15 DIAGNOSIS — L89150 Pressure ulcer of sacral region, unstageable: Secondary | ICD-10-CM | POA: Diagnosis not present

## 2023-10-16 DIAGNOSIS — L89150 Pressure ulcer of sacral region, unstageable: Secondary | ICD-10-CM | POA: Diagnosis not present

## 2023-10-16 DIAGNOSIS — R5381 Other malaise: Secondary | ICD-10-CM | POA: Diagnosis not present

## 2023-10-16 DIAGNOSIS — J9601 Acute respiratory failure with hypoxia: Secondary | ICD-10-CM | POA: Diagnosis not present

## 2023-10-16 DIAGNOSIS — N186 End stage renal disease: Secondary | ICD-10-CM | POA: Diagnosis not present

## 2023-10-17 DIAGNOSIS — J9601 Acute respiratory failure with hypoxia: Secondary | ICD-10-CM | POA: Diagnosis not present

## 2023-10-17 DIAGNOSIS — R5381 Other malaise: Secondary | ICD-10-CM | POA: Diagnosis not present

## 2023-10-17 DIAGNOSIS — N186 End stage renal disease: Secondary | ICD-10-CM | POA: Diagnosis not present

## 2023-10-17 DIAGNOSIS — L89150 Pressure ulcer of sacral region, unstageable: Secondary | ICD-10-CM | POA: Diagnosis not present

## 2023-10-18 DIAGNOSIS — G40909 Epilepsy, unspecified, not intractable, without status epilepticus: Secondary | ICD-10-CM

## 2023-10-18 DIAGNOSIS — R5381 Other malaise: Secondary | ICD-10-CM

## 2023-10-18 DIAGNOSIS — J9601 Acute respiratory failure with hypoxia: Secondary | ICD-10-CM

## 2023-10-18 DIAGNOSIS — L89150 Pressure ulcer of sacral region, unstageable: Secondary | ICD-10-CM

## 2023-10-18 DIAGNOSIS — N186 End stage renal disease: Secondary | ICD-10-CM

## 2023-10-19 DIAGNOSIS — J9601 Acute respiratory failure with hypoxia: Secondary | ICD-10-CM | POA: Diagnosis not present

## 2023-10-19 DIAGNOSIS — N186 End stage renal disease: Secondary | ICD-10-CM | POA: Diagnosis not present

## 2023-10-19 DIAGNOSIS — R5381 Other malaise: Secondary | ICD-10-CM | POA: Diagnosis not present

## 2023-10-19 DIAGNOSIS — L89150 Pressure ulcer of sacral region, unstageable: Secondary | ICD-10-CM | POA: Diagnosis not present

## 2023-10-20 DIAGNOSIS — R5381 Other malaise: Secondary | ICD-10-CM | POA: Diagnosis not present

## 2023-10-20 DIAGNOSIS — L89150 Pressure ulcer of sacral region, unstageable: Secondary | ICD-10-CM | POA: Diagnosis not present

## 2023-10-20 DIAGNOSIS — N186 End stage renal disease: Secondary | ICD-10-CM | POA: Diagnosis not present

## 2023-10-20 DIAGNOSIS — J9601 Acute respiratory failure with hypoxia: Secondary | ICD-10-CM | POA: Diagnosis not present

## 2023-10-21 DIAGNOSIS — N186 End stage renal disease: Secondary | ICD-10-CM | POA: Diagnosis not present

## 2023-10-21 DIAGNOSIS — L89150 Pressure ulcer of sacral region, unstageable: Secondary | ICD-10-CM | POA: Diagnosis not present

## 2023-10-21 DIAGNOSIS — J9601 Acute respiratory failure with hypoxia: Secondary | ICD-10-CM | POA: Diagnosis not present

## 2023-10-21 DIAGNOSIS — R5381 Other malaise: Secondary | ICD-10-CM | POA: Diagnosis not present

## 2023-10-22 DIAGNOSIS — R5381 Other malaise: Secondary | ICD-10-CM | POA: Diagnosis not present

## 2023-10-22 DIAGNOSIS — J9601 Acute respiratory failure with hypoxia: Secondary | ICD-10-CM | POA: Diagnosis not present

## 2023-10-22 DIAGNOSIS — L89150 Pressure ulcer of sacral region, unstageable: Secondary | ICD-10-CM | POA: Diagnosis not present

## 2023-10-22 DIAGNOSIS — N186 End stage renal disease: Secondary | ICD-10-CM | POA: Diagnosis not present

## 2023-10-23 DIAGNOSIS — L89150 Pressure ulcer of sacral region, unstageable: Secondary | ICD-10-CM | POA: Diagnosis not present

## 2023-10-23 DIAGNOSIS — N186 End stage renal disease: Secondary | ICD-10-CM | POA: Diagnosis not present

## 2023-10-23 DIAGNOSIS — J9601 Acute respiratory failure with hypoxia: Secondary | ICD-10-CM | POA: Diagnosis not present

## 2023-10-23 DIAGNOSIS — R5381 Other malaise: Secondary | ICD-10-CM | POA: Diagnosis not present

## 2023-10-23 DIAGNOSIS — G40909 Epilepsy, unspecified, not intractable, without status epilepticus: Secondary | ICD-10-CM

## 2023-10-24 DIAGNOSIS — N186 End stage renal disease: Secondary | ICD-10-CM | POA: Diagnosis not present

## 2023-10-24 DIAGNOSIS — R5381 Other malaise: Secondary | ICD-10-CM | POA: Diagnosis not present

## 2023-10-24 DIAGNOSIS — L89150 Pressure ulcer of sacral region, unstageable: Secondary | ICD-10-CM | POA: Diagnosis not present

## 2023-10-24 DIAGNOSIS — J9601 Acute respiratory failure with hypoxia: Secondary | ICD-10-CM | POA: Diagnosis not present

## 2023-10-25 DIAGNOSIS — G40909 Epilepsy, unspecified, not intractable, without status epilepticus: Secondary | ICD-10-CM

## 2023-10-25 DIAGNOSIS — L89150 Pressure ulcer of sacral region, unstageable: Secondary | ICD-10-CM | POA: Diagnosis not present

## 2023-10-25 DIAGNOSIS — N186 End stage renal disease: Secondary | ICD-10-CM | POA: Diagnosis not present

## 2023-10-25 DIAGNOSIS — R5381 Other malaise: Secondary | ICD-10-CM | POA: Diagnosis not present

## 2023-10-25 DIAGNOSIS — J9601 Acute respiratory failure with hypoxia: Secondary | ICD-10-CM | POA: Diagnosis not present

## 2023-10-26 DIAGNOSIS — L89150 Pressure ulcer of sacral region, unstageable: Secondary | ICD-10-CM | POA: Diagnosis not present

## 2023-10-26 DIAGNOSIS — J9601 Acute respiratory failure with hypoxia: Secondary | ICD-10-CM | POA: Diagnosis not present

## 2023-10-26 DIAGNOSIS — R5381 Other malaise: Secondary | ICD-10-CM | POA: Diagnosis not present

## 2023-10-26 DIAGNOSIS — N186 End stage renal disease: Secondary | ICD-10-CM | POA: Diagnosis not present

## 2023-10-27 ENCOUNTER — Encounter (HOSPITAL_COMMUNITY): Payer: Self-pay

## 2023-10-27 DIAGNOSIS — J9601 Acute respiratory failure with hypoxia: Secondary | ICD-10-CM | POA: Diagnosis not present

## 2023-10-27 DIAGNOSIS — L89150 Pressure ulcer of sacral region, unstageable: Secondary | ICD-10-CM | POA: Diagnosis not present

## 2023-10-27 DIAGNOSIS — R5381 Other malaise: Secondary | ICD-10-CM | POA: Diagnosis not present

## 2023-10-27 DIAGNOSIS — N186 End stage renal disease: Secondary | ICD-10-CM | POA: Diagnosis not present

## 2023-10-28 DIAGNOSIS — N186 End stage renal disease: Secondary | ICD-10-CM | POA: Diagnosis not present

## 2023-10-28 DIAGNOSIS — L89150 Pressure ulcer of sacral region, unstageable: Secondary | ICD-10-CM | POA: Diagnosis not present

## 2023-10-28 DIAGNOSIS — J9601 Acute respiratory failure with hypoxia: Secondary | ICD-10-CM | POA: Diagnosis not present

## 2023-10-28 DIAGNOSIS — R5381 Other malaise: Secondary | ICD-10-CM | POA: Diagnosis not present

## 2023-10-29 DIAGNOSIS — N186 End stage renal disease: Secondary | ICD-10-CM | POA: Diagnosis not present

## 2023-10-29 DIAGNOSIS — R5381 Other malaise: Secondary | ICD-10-CM | POA: Diagnosis not present

## 2023-10-29 DIAGNOSIS — J9601 Acute respiratory failure with hypoxia: Secondary | ICD-10-CM | POA: Diagnosis not present

## 2023-10-29 DIAGNOSIS — L89150 Pressure ulcer of sacral region, unstageable: Secondary | ICD-10-CM | POA: Diagnosis not present

## 2023-10-30 DIAGNOSIS — G40909 Epilepsy, unspecified, not intractable, without status epilepticus: Secondary | ICD-10-CM

## 2023-10-30 DIAGNOSIS — L89150 Pressure ulcer of sacral region, unstageable: Secondary | ICD-10-CM | POA: Diagnosis not present

## 2023-10-30 DIAGNOSIS — R5381 Other malaise: Secondary | ICD-10-CM | POA: Diagnosis not present

## 2023-10-30 DIAGNOSIS — N186 End stage renal disease: Secondary | ICD-10-CM | POA: Diagnosis not present

## 2023-10-30 DIAGNOSIS — J9601 Acute respiratory failure with hypoxia: Secondary | ICD-10-CM | POA: Diagnosis not present

## 2023-10-31 DIAGNOSIS — J9601 Acute respiratory failure with hypoxia: Secondary | ICD-10-CM | POA: Diagnosis not present

## 2023-10-31 DIAGNOSIS — L89150 Pressure ulcer of sacral region, unstageable: Secondary | ICD-10-CM | POA: Diagnosis not present

## 2023-10-31 DIAGNOSIS — R5381 Other malaise: Secondary | ICD-10-CM | POA: Diagnosis not present

## 2023-10-31 DIAGNOSIS — N186 End stage renal disease: Secondary | ICD-10-CM | POA: Diagnosis not present

## 2023-11-01 DIAGNOSIS — J9601 Acute respiratory failure with hypoxia: Secondary | ICD-10-CM | POA: Diagnosis not present

## 2023-11-01 DIAGNOSIS — N186 End stage renal disease: Secondary | ICD-10-CM | POA: Diagnosis not present

## 2023-11-01 DIAGNOSIS — R5381 Other malaise: Secondary | ICD-10-CM | POA: Diagnosis not present

## 2023-11-01 DIAGNOSIS — G40909 Epilepsy, unspecified, not intractable, without status epilepticus: Secondary | ICD-10-CM

## 2023-11-01 DIAGNOSIS — L89150 Pressure ulcer of sacral region, unstageable: Secondary | ICD-10-CM | POA: Diagnosis not present

## 2023-11-02 DIAGNOSIS — J9601 Acute respiratory failure with hypoxia: Secondary | ICD-10-CM | POA: Diagnosis not present

## 2023-11-02 DIAGNOSIS — R5381 Other malaise: Secondary | ICD-10-CM | POA: Diagnosis not present

## 2023-11-02 DIAGNOSIS — N186 End stage renal disease: Secondary | ICD-10-CM | POA: Diagnosis not present

## 2023-11-02 DIAGNOSIS — L89150 Pressure ulcer of sacral region, unstageable: Secondary | ICD-10-CM | POA: Diagnosis not present

## 2023-11-03 DIAGNOSIS — R5381 Other malaise: Secondary | ICD-10-CM | POA: Diagnosis not present

## 2023-11-03 DIAGNOSIS — L89150 Pressure ulcer of sacral region, unstageable: Secondary | ICD-10-CM | POA: Diagnosis not present

## 2023-11-03 DIAGNOSIS — J9601 Acute respiratory failure with hypoxia: Secondary | ICD-10-CM | POA: Diagnosis not present

## 2023-11-03 DIAGNOSIS — N186 End stage renal disease: Secondary | ICD-10-CM | POA: Diagnosis not present

## 2023-11-04 DIAGNOSIS — J9601 Acute respiratory failure with hypoxia: Secondary | ICD-10-CM | POA: Diagnosis not present

## 2023-11-04 DIAGNOSIS — R5381 Other malaise: Secondary | ICD-10-CM | POA: Diagnosis not present

## 2023-11-04 DIAGNOSIS — L89150 Pressure ulcer of sacral region, unstageable: Secondary | ICD-10-CM | POA: Diagnosis not present

## 2023-11-04 DIAGNOSIS — N186 End stage renal disease: Secondary | ICD-10-CM | POA: Diagnosis not present

## 2023-11-05 DIAGNOSIS — J9601 Acute respiratory failure with hypoxia: Secondary | ICD-10-CM | POA: Diagnosis not present

## 2023-11-05 DIAGNOSIS — N186 End stage renal disease: Secondary | ICD-10-CM | POA: Diagnosis not present

## 2023-11-05 DIAGNOSIS — R5381 Other malaise: Secondary | ICD-10-CM | POA: Diagnosis not present

## 2023-11-05 DIAGNOSIS — L89150 Pressure ulcer of sacral region, unstageable: Secondary | ICD-10-CM | POA: Diagnosis not present

## 2023-11-06 DIAGNOSIS — N186 End stage renal disease: Secondary | ICD-10-CM | POA: Diagnosis not present

## 2023-11-06 DIAGNOSIS — R5381 Other malaise: Secondary | ICD-10-CM | POA: Diagnosis not present

## 2023-11-06 DIAGNOSIS — G40909 Epilepsy, unspecified, not intractable, without status epilepticus: Secondary | ICD-10-CM

## 2023-11-06 DIAGNOSIS — L89150 Pressure ulcer of sacral region, unstageable: Secondary | ICD-10-CM | POA: Diagnosis not present

## 2023-11-06 DIAGNOSIS — J9601 Acute respiratory failure with hypoxia: Secondary | ICD-10-CM | POA: Diagnosis not present

## 2023-11-07 DIAGNOSIS — N186 End stage renal disease: Secondary | ICD-10-CM | POA: Diagnosis not present

## 2023-11-07 DIAGNOSIS — L89150 Pressure ulcer of sacral region, unstageable: Secondary | ICD-10-CM | POA: Diagnosis not present

## 2023-11-07 DIAGNOSIS — J9601 Acute respiratory failure with hypoxia: Secondary | ICD-10-CM | POA: Diagnosis not present

## 2023-11-07 DIAGNOSIS — R5381 Other malaise: Secondary | ICD-10-CM | POA: Diagnosis not present

## 2023-11-08 DIAGNOSIS — N186 End stage renal disease: Secondary | ICD-10-CM

## 2023-11-08 DIAGNOSIS — R5381 Other malaise: Secondary | ICD-10-CM

## 2023-11-08 DIAGNOSIS — L89150 Pressure ulcer of sacral region, unstageable: Secondary | ICD-10-CM | POA: Diagnosis not present

## 2023-11-08 DIAGNOSIS — J9601 Acute respiratory failure with hypoxia: Secondary | ICD-10-CM

## 2023-11-08 DIAGNOSIS — G40909 Epilepsy, unspecified, not intractable, without status epilepticus: Secondary | ICD-10-CM

## 2023-11-09 DIAGNOSIS — L89150 Pressure ulcer of sacral region, unstageable: Secondary | ICD-10-CM | POA: Diagnosis not present

## 2023-11-09 DIAGNOSIS — R5381 Other malaise: Secondary | ICD-10-CM | POA: Diagnosis not present

## 2023-11-09 DIAGNOSIS — J9601 Acute respiratory failure with hypoxia: Secondary | ICD-10-CM | POA: Diagnosis not present

## 2023-11-09 DIAGNOSIS — N186 End stage renal disease: Secondary | ICD-10-CM | POA: Diagnosis not present

## 2023-11-10 DIAGNOSIS — R5381 Other malaise: Secondary | ICD-10-CM | POA: Diagnosis not present

## 2023-11-10 DIAGNOSIS — L89150 Pressure ulcer of sacral region, unstageable: Secondary | ICD-10-CM | POA: Diagnosis not present

## 2023-11-10 DIAGNOSIS — N186 End stage renal disease: Secondary | ICD-10-CM | POA: Diagnosis not present

## 2023-11-10 DIAGNOSIS — J9601 Acute respiratory failure with hypoxia: Secondary | ICD-10-CM | POA: Diagnosis not present

## 2023-11-11 DIAGNOSIS — J9601 Acute respiratory failure with hypoxia: Secondary | ICD-10-CM | POA: Diagnosis not present

## 2023-11-11 DIAGNOSIS — L89150 Pressure ulcer of sacral region, unstageable: Secondary | ICD-10-CM | POA: Diagnosis not present

## 2023-11-11 DIAGNOSIS — N186 End stage renal disease: Secondary | ICD-10-CM | POA: Diagnosis not present

## 2023-11-11 DIAGNOSIS — R5381 Other malaise: Secondary | ICD-10-CM | POA: Diagnosis not present

## 2023-11-12 DIAGNOSIS — R5381 Other malaise: Secondary | ICD-10-CM | POA: Diagnosis not present

## 2023-11-12 DIAGNOSIS — N186 End stage renal disease: Secondary | ICD-10-CM | POA: Diagnosis not present

## 2023-11-12 DIAGNOSIS — J9601 Acute respiratory failure with hypoxia: Secondary | ICD-10-CM | POA: Diagnosis not present

## 2023-11-12 DIAGNOSIS — L89150 Pressure ulcer of sacral region, unstageable: Secondary | ICD-10-CM | POA: Diagnosis not present

## 2023-11-13 DIAGNOSIS — N186 End stage renal disease: Secondary | ICD-10-CM | POA: Diagnosis not present

## 2023-11-13 DIAGNOSIS — R5381 Other malaise: Secondary | ICD-10-CM | POA: Diagnosis not present

## 2023-11-13 DIAGNOSIS — L89150 Pressure ulcer of sacral region, unstageable: Secondary | ICD-10-CM | POA: Diagnosis not present

## 2023-11-13 DIAGNOSIS — J9601 Acute respiratory failure with hypoxia: Secondary | ICD-10-CM | POA: Diagnosis not present

## 2023-11-14 DIAGNOSIS — J9601 Acute respiratory failure with hypoxia: Secondary | ICD-10-CM | POA: Diagnosis not present

## 2023-11-14 DIAGNOSIS — N186 End stage renal disease: Secondary | ICD-10-CM | POA: Diagnosis not present

## 2023-11-14 DIAGNOSIS — R5381 Other malaise: Secondary | ICD-10-CM | POA: Diagnosis not present

## 2023-11-14 DIAGNOSIS — L89150 Pressure ulcer of sacral region, unstageable: Secondary | ICD-10-CM | POA: Diagnosis not present

## 2023-11-15 DIAGNOSIS — J9601 Acute respiratory failure with hypoxia: Secondary | ICD-10-CM | POA: Diagnosis not present

## 2023-11-15 DIAGNOSIS — G40909 Epilepsy, unspecified, not intractable, without status epilepticus: Secondary | ICD-10-CM

## 2023-11-15 DIAGNOSIS — L89150 Pressure ulcer of sacral region, unstageable: Secondary | ICD-10-CM | POA: Diagnosis not present

## 2023-11-15 DIAGNOSIS — N186 End stage renal disease: Secondary | ICD-10-CM | POA: Diagnosis not present

## 2023-11-15 DIAGNOSIS — R5381 Other malaise: Secondary | ICD-10-CM | POA: Diagnosis not present

## 2023-11-16 DIAGNOSIS — N186 End stage renal disease: Secondary | ICD-10-CM | POA: Diagnosis not present

## 2023-11-16 DIAGNOSIS — L89150 Pressure ulcer of sacral region, unstageable: Secondary | ICD-10-CM | POA: Diagnosis not present

## 2023-11-16 DIAGNOSIS — R5381 Other malaise: Secondary | ICD-10-CM | POA: Diagnosis not present

## 2023-11-16 DIAGNOSIS — J9601 Acute respiratory failure with hypoxia: Secondary | ICD-10-CM | POA: Diagnosis not present

## 2023-11-17 DIAGNOSIS — N186 End stage renal disease: Secondary | ICD-10-CM | POA: Diagnosis not present

## 2023-11-17 DIAGNOSIS — J9601 Acute respiratory failure with hypoxia: Secondary | ICD-10-CM | POA: Diagnosis not present

## 2023-11-17 DIAGNOSIS — R5381 Other malaise: Secondary | ICD-10-CM | POA: Diagnosis not present

## 2023-11-17 DIAGNOSIS — L89150 Pressure ulcer of sacral region, unstageable: Secondary | ICD-10-CM | POA: Diagnosis not present

## 2023-11-18 DIAGNOSIS — R5381 Other malaise: Secondary | ICD-10-CM | POA: Diagnosis not present

## 2023-11-18 DIAGNOSIS — N186 End stage renal disease: Secondary | ICD-10-CM | POA: Diagnosis not present

## 2023-11-18 DIAGNOSIS — L89150 Pressure ulcer of sacral region, unstageable: Secondary | ICD-10-CM | POA: Diagnosis not present

## 2023-11-18 DIAGNOSIS — J9601 Acute respiratory failure with hypoxia: Secondary | ICD-10-CM | POA: Diagnosis not present

## 2023-11-19 DIAGNOSIS — L89150 Pressure ulcer of sacral region, unstageable: Secondary | ICD-10-CM | POA: Diagnosis not present

## 2023-11-19 DIAGNOSIS — N186 End stage renal disease: Secondary | ICD-10-CM | POA: Diagnosis not present

## 2023-11-19 DIAGNOSIS — R5381 Other malaise: Secondary | ICD-10-CM | POA: Diagnosis not present

## 2023-11-19 DIAGNOSIS — J9601 Acute respiratory failure with hypoxia: Secondary | ICD-10-CM | POA: Diagnosis not present

## 2023-11-20 DIAGNOSIS — R5381 Other malaise: Secondary | ICD-10-CM | POA: Diagnosis not present

## 2023-11-20 DIAGNOSIS — L89150 Pressure ulcer of sacral region, unstageable: Secondary | ICD-10-CM | POA: Diagnosis not present

## 2023-11-20 DIAGNOSIS — G40909 Epilepsy, unspecified, not intractable, without status epilepticus: Secondary | ICD-10-CM

## 2023-11-20 DIAGNOSIS — J9601 Acute respiratory failure with hypoxia: Secondary | ICD-10-CM | POA: Diagnosis not present

## 2023-11-20 DIAGNOSIS — N186 End stage renal disease: Secondary | ICD-10-CM | POA: Diagnosis not present

## 2023-11-21 DIAGNOSIS — J9601 Acute respiratory failure with hypoxia: Secondary | ICD-10-CM | POA: Diagnosis not present

## 2023-11-21 DIAGNOSIS — N186 End stage renal disease: Secondary | ICD-10-CM | POA: Diagnosis not present

## 2023-11-21 DIAGNOSIS — R5381 Other malaise: Secondary | ICD-10-CM | POA: Diagnosis not present

## 2023-11-21 DIAGNOSIS — L89150 Pressure ulcer of sacral region, unstageable: Secondary | ICD-10-CM | POA: Diagnosis not present

## 2023-11-22 DIAGNOSIS — N186 End stage renal disease: Secondary | ICD-10-CM | POA: Diagnosis not present

## 2023-11-22 DIAGNOSIS — R5381 Other malaise: Secondary | ICD-10-CM | POA: Diagnosis not present

## 2023-11-22 DIAGNOSIS — G40909 Epilepsy, unspecified, not intractable, without status epilepticus: Secondary | ICD-10-CM

## 2023-11-22 DIAGNOSIS — J9601 Acute respiratory failure with hypoxia: Secondary | ICD-10-CM | POA: Diagnosis not present

## 2023-11-22 DIAGNOSIS — L89150 Pressure ulcer of sacral region, unstageable: Secondary | ICD-10-CM | POA: Diagnosis not present

## 2023-11-23 DIAGNOSIS — L89150 Pressure ulcer of sacral region, unstageable: Secondary | ICD-10-CM | POA: Diagnosis not present

## 2023-11-23 DIAGNOSIS — R5381 Other malaise: Secondary | ICD-10-CM | POA: Diagnosis not present

## 2023-11-23 DIAGNOSIS — J9601 Acute respiratory failure with hypoxia: Secondary | ICD-10-CM | POA: Diagnosis not present

## 2023-11-23 DIAGNOSIS — N186 End stage renal disease: Secondary | ICD-10-CM | POA: Diagnosis not present

## 2023-11-24 DIAGNOSIS — J9601 Acute respiratory failure with hypoxia: Secondary | ICD-10-CM | POA: Diagnosis not present

## 2023-11-24 DIAGNOSIS — L89150 Pressure ulcer of sacral region, unstageable: Secondary | ICD-10-CM | POA: Diagnosis not present

## 2023-11-24 DIAGNOSIS — R5381 Other malaise: Secondary | ICD-10-CM | POA: Diagnosis not present

## 2023-11-24 DIAGNOSIS — N186 End stage renal disease: Secondary | ICD-10-CM | POA: Diagnosis not present

## 2023-11-25 DIAGNOSIS — L89150 Pressure ulcer of sacral region, unstageable: Secondary | ICD-10-CM | POA: Diagnosis not present

## 2023-11-25 DIAGNOSIS — N186 End stage renal disease: Secondary | ICD-10-CM | POA: Diagnosis not present

## 2023-11-25 DIAGNOSIS — J9601 Acute respiratory failure with hypoxia: Secondary | ICD-10-CM | POA: Diagnosis not present

## 2023-11-25 DIAGNOSIS — R5381 Other malaise: Secondary | ICD-10-CM | POA: Diagnosis not present

## 2023-11-26 DIAGNOSIS — N186 End stage renal disease: Secondary | ICD-10-CM | POA: Diagnosis not present

## 2023-11-26 DIAGNOSIS — L89150 Pressure ulcer of sacral region, unstageable: Secondary | ICD-10-CM | POA: Diagnosis not present

## 2023-11-26 DIAGNOSIS — J9601 Acute respiratory failure with hypoxia: Secondary | ICD-10-CM | POA: Diagnosis not present

## 2023-11-26 DIAGNOSIS — R5381 Other malaise: Secondary | ICD-10-CM | POA: Diagnosis not present

## 2023-11-27 DIAGNOSIS — L89150 Pressure ulcer of sacral region, unstageable: Secondary | ICD-10-CM | POA: Diagnosis not present

## 2023-11-27 DIAGNOSIS — J9601 Acute respiratory failure with hypoxia: Secondary | ICD-10-CM | POA: Diagnosis not present

## 2023-11-27 DIAGNOSIS — N186 End stage renal disease: Secondary | ICD-10-CM | POA: Diagnosis not present

## 2023-11-27 DIAGNOSIS — R5381 Other malaise: Secondary | ICD-10-CM | POA: Diagnosis not present

## 2023-11-28 DIAGNOSIS — J9601 Acute respiratory failure with hypoxia: Secondary | ICD-10-CM | POA: Diagnosis not present

## 2023-11-28 DIAGNOSIS — N186 End stage renal disease: Secondary | ICD-10-CM | POA: Diagnosis not present

## 2023-11-28 DIAGNOSIS — L89150 Pressure ulcer of sacral region, unstageable: Secondary | ICD-10-CM | POA: Diagnosis not present

## 2023-11-28 DIAGNOSIS — R5381 Other malaise: Secondary | ICD-10-CM | POA: Diagnosis not present

## 2023-11-29 DIAGNOSIS — G40909 Epilepsy, unspecified, not intractable, without status epilepticus: Secondary | ICD-10-CM

## 2023-11-29 DIAGNOSIS — J9601 Acute respiratory failure with hypoxia: Secondary | ICD-10-CM | POA: Diagnosis not present

## 2023-11-29 DIAGNOSIS — R5381 Other malaise: Secondary | ICD-10-CM | POA: Diagnosis not present

## 2023-11-29 DIAGNOSIS — L89150 Pressure ulcer of sacral region, unstageable: Secondary | ICD-10-CM | POA: Diagnosis not present

## 2023-11-29 DIAGNOSIS — N186 End stage renal disease: Secondary | ICD-10-CM | POA: Diagnosis not present

## 2023-11-30 DIAGNOSIS — L89150 Pressure ulcer of sacral region, unstageable: Secondary | ICD-10-CM | POA: Diagnosis not present

## 2023-11-30 DIAGNOSIS — N186 End stage renal disease: Secondary | ICD-10-CM | POA: Diagnosis not present

## 2023-11-30 DIAGNOSIS — J9601 Acute respiratory failure with hypoxia: Secondary | ICD-10-CM | POA: Diagnosis not present

## 2023-11-30 DIAGNOSIS — R5381 Other malaise: Secondary | ICD-10-CM | POA: Diagnosis not present

## 2023-12-01 DIAGNOSIS — L89150 Pressure ulcer of sacral region, unstageable: Secondary | ICD-10-CM | POA: Diagnosis not present

## 2023-12-01 DIAGNOSIS — N186 End stage renal disease: Secondary | ICD-10-CM | POA: Diagnosis not present

## 2023-12-01 DIAGNOSIS — R5381 Other malaise: Secondary | ICD-10-CM | POA: Diagnosis not present

## 2023-12-01 DIAGNOSIS — J9601 Acute respiratory failure with hypoxia: Secondary | ICD-10-CM | POA: Diagnosis not present

## 2023-12-02 DIAGNOSIS — R5381 Other malaise: Secondary | ICD-10-CM | POA: Diagnosis not present

## 2023-12-02 DIAGNOSIS — J9601 Acute respiratory failure with hypoxia: Secondary | ICD-10-CM | POA: Diagnosis not present

## 2023-12-02 DIAGNOSIS — N186 End stage renal disease: Secondary | ICD-10-CM | POA: Diagnosis not present

## 2023-12-02 DIAGNOSIS — L89150 Pressure ulcer of sacral region, unstageable: Secondary | ICD-10-CM | POA: Diagnosis not present

## 2023-12-03 ENCOUNTER — Other Ambulatory Visit: Payer: Self-pay | Admitting: Nephrology

## 2023-12-03 DIAGNOSIS — N186 End stage renal disease: Secondary | ICD-10-CM | POA: Diagnosis not present

## 2023-12-03 DIAGNOSIS — R5381 Other malaise: Secondary | ICD-10-CM | POA: Diagnosis not present

## 2023-12-03 DIAGNOSIS — L89150 Pressure ulcer of sacral region, unstageable: Secondary | ICD-10-CM | POA: Diagnosis not present

## 2023-12-03 DIAGNOSIS — J9601 Acute respiratory failure with hypoxia: Secondary | ICD-10-CM | POA: Diagnosis not present

## 2023-12-04 DIAGNOSIS — R5381 Other malaise: Secondary | ICD-10-CM | POA: Diagnosis not present

## 2023-12-04 DIAGNOSIS — G40909 Epilepsy, unspecified, not intractable, without status epilepticus: Secondary | ICD-10-CM

## 2023-12-04 DIAGNOSIS — N186 End stage renal disease: Secondary | ICD-10-CM | POA: Diagnosis not present

## 2023-12-04 DIAGNOSIS — L89150 Pressure ulcer of sacral region, unstageable: Secondary | ICD-10-CM | POA: Diagnosis not present

## 2023-12-04 DIAGNOSIS — J9601 Acute respiratory failure with hypoxia: Secondary | ICD-10-CM | POA: Diagnosis not present

## 2023-12-05 DIAGNOSIS — L89150 Pressure ulcer of sacral region, unstageable: Secondary | ICD-10-CM | POA: Diagnosis not present

## 2023-12-05 DIAGNOSIS — R5381 Other malaise: Secondary | ICD-10-CM | POA: Diagnosis not present

## 2023-12-05 DIAGNOSIS — J9601 Acute respiratory failure with hypoxia: Secondary | ICD-10-CM | POA: Diagnosis not present

## 2023-12-05 DIAGNOSIS — N186 End stage renal disease: Secondary | ICD-10-CM | POA: Diagnosis not present

## 2023-12-06 DIAGNOSIS — N186 End stage renal disease: Secondary | ICD-10-CM | POA: Diagnosis not present

## 2023-12-06 DIAGNOSIS — R5381 Other malaise: Secondary | ICD-10-CM | POA: Diagnosis not present

## 2023-12-06 DIAGNOSIS — J9601 Acute respiratory failure with hypoxia: Secondary | ICD-10-CM | POA: Diagnosis not present

## 2023-12-06 DIAGNOSIS — L89150 Pressure ulcer of sacral region, unstageable: Secondary | ICD-10-CM | POA: Diagnosis not present

## 2023-12-07 DIAGNOSIS — J9601 Acute respiratory failure with hypoxia: Secondary | ICD-10-CM | POA: Diagnosis not present

## 2023-12-07 DIAGNOSIS — R5381 Other malaise: Secondary | ICD-10-CM

## 2023-12-07 DIAGNOSIS — G40909 Epilepsy, unspecified, not intractable, without status epilepticus: Secondary | ICD-10-CM | POA: Diagnosis not present

## 2023-12-07 DIAGNOSIS — L89150 Pressure ulcer of sacral region, unstageable: Secondary | ICD-10-CM | POA: Diagnosis not present

## 2023-12-07 DIAGNOSIS — N186 End stage renal disease: Secondary | ICD-10-CM | POA: Diagnosis not present

## 2023-12-08 DIAGNOSIS — L89150 Pressure ulcer of sacral region, unstageable: Secondary | ICD-10-CM | POA: Diagnosis not present

## 2023-12-08 DIAGNOSIS — N186 End stage renal disease: Secondary | ICD-10-CM | POA: Diagnosis not present

## 2023-12-08 DIAGNOSIS — J9601 Acute respiratory failure with hypoxia: Secondary | ICD-10-CM | POA: Diagnosis not present

## 2023-12-08 DIAGNOSIS — G40909 Epilepsy, unspecified, not intractable, without status epilepticus: Secondary | ICD-10-CM | POA: Diagnosis not present

## 2023-12-15 ENCOUNTER — Other Ambulatory Visit: Payer: Self-pay

## 2023-12-15 ENCOUNTER — Encounter (HOSPITAL_COMMUNITY): Payer: Self-pay

## 2023-12-15 ENCOUNTER — Emergency Department

## 2023-12-15 ENCOUNTER — Inpatient Hospital Stay
Admission: EM | Admit: 2023-12-15 | Discharge: 2023-12-24 | DRG: 871 | Disposition: A | Discharge status: Other Acute Inpatient Hospital | Attending: Internal Medicine | Admitting: Internal Medicine

## 2023-12-15 DIAGNOSIS — F17210 Nicotine dependence, cigarettes, uncomplicated: Secondary | ICD-10-CM | POA: Diagnosis present

## 2023-12-15 DIAGNOSIS — K8051 Calculus of bile duct without cholangitis or cholecystitis with obstruction: Secondary | ICD-10-CM | POA: Diagnosis not present

## 2023-12-15 DIAGNOSIS — G40909 Epilepsy, unspecified, not intractable, without status epilepticus: Secondary | ICD-10-CM | POA: Diagnosis present

## 2023-12-15 DIAGNOSIS — I69391 Dysphagia following cerebral infarction: Secondary | ICD-10-CM

## 2023-12-15 DIAGNOSIS — Z1152 Encounter for screening for COVID-19: Secondary | ICD-10-CM | POA: Diagnosis not present

## 2023-12-15 DIAGNOSIS — Z931 Gastrostomy status: Secondary | ICD-10-CM

## 2023-12-15 DIAGNOSIS — R945 Abnormal results of liver function studies: Secondary | ICD-10-CM | POA: Diagnosis present

## 2023-12-15 DIAGNOSIS — R627 Adult failure to thrive: Secondary | ICD-10-CM | POA: Diagnosis present

## 2023-12-15 DIAGNOSIS — N2581 Secondary hyperparathyroidism of renal origin: Secondary | ICD-10-CM | POA: Diagnosis present

## 2023-12-15 DIAGNOSIS — R6520 Severe sepsis without septic shock: Secondary | ICD-10-CM | POA: Diagnosis present

## 2023-12-15 DIAGNOSIS — I5032 Chronic diastolic (congestive) heart failure: Secondary | ICD-10-CM | POA: Diagnosis present

## 2023-12-15 DIAGNOSIS — A4151 Sepsis due to Escherichia coli [E. coli]: Secondary | ICD-10-CM | POA: Diagnosis present

## 2023-12-15 DIAGNOSIS — E876 Hypokalemia: Secondary | ICD-10-CM | POA: Diagnosis not present

## 2023-12-15 DIAGNOSIS — I132 Hypertensive heart and chronic kidney disease with heart failure and with stage 5 chronic kidney disease, or end stage renal disease: Secondary | ICD-10-CM | POA: Diagnosis present

## 2023-12-15 DIAGNOSIS — D72829 Elevated white blood cell count, unspecified: Secondary | ICD-10-CM | POA: Diagnosis not present

## 2023-12-15 DIAGNOSIS — G9341 Metabolic encephalopathy: Secondary | ICD-10-CM | POA: Diagnosis present

## 2023-12-15 DIAGNOSIS — B9620 Unspecified Escherichia coli [E. coli] as the cause of diseases classified elsewhere: Secondary | ICD-10-CM | POA: Diagnosis not present

## 2023-12-15 DIAGNOSIS — K838 Other specified diseases of biliary tract: Secondary | ICD-10-CM

## 2023-12-15 DIAGNOSIS — E440 Moderate protein-calorie malnutrition: Secondary | ICD-10-CM | POA: Diagnosis present

## 2023-12-15 DIAGNOSIS — Z7982 Long term (current) use of aspirin: Secondary | ICD-10-CM

## 2023-12-15 DIAGNOSIS — R130 Aphagia: Secondary | ICD-10-CM | POA: Diagnosis present

## 2023-12-15 DIAGNOSIS — I10 Essential (primary) hypertension: Secondary | ICD-10-CM | POA: Diagnosis present

## 2023-12-15 DIAGNOSIS — Z8659 Personal history of other mental and behavioral disorders: Secondary | ICD-10-CM | POA: Diagnosis not present

## 2023-12-15 DIAGNOSIS — F319 Bipolar disorder, unspecified: Secondary | ICD-10-CM | POA: Diagnosis present

## 2023-12-15 DIAGNOSIS — S31000A Unspecified open wound of lower back and pelvis without penetration into retroperitoneum, initial encounter: Secondary | ICD-10-CM | POA: Diagnosis present

## 2023-12-15 DIAGNOSIS — N390 Urinary tract infection, site not specified: Secondary | ICD-10-CM | POA: Diagnosis present

## 2023-12-15 DIAGNOSIS — B182 Chronic viral hepatitis C: Secondary | ICD-10-CM | POA: Diagnosis present

## 2023-12-15 DIAGNOSIS — Z6820 Body mass index (BMI) 20.0-20.9, adult: Secondary | ICD-10-CM

## 2023-12-15 DIAGNOSIS — Z992 Dependence on renal dialysis: Secondary | ICD-10-CM

## 2023-12-15 DIAGNOSIS — K8033 Calculus of bile duct with acute cholangitis with obstruction: Secondary | ICD-10-CM | POA: Diagnosis present

## 2023-12-15 DIAGNOSIS — G459 Transient cerebral ischemic attack, unspecified: Secondary | ICD-10-CM | POA: Diagnosis not present

## 2023-12-15 DIAGNOSIS — A419 Sepsis, unspecified organism: Secondary | ICD-10-CM | POA: Diagnosis not present

## 2023-12-15 DIAGNOSIS — E872 Acidosis, unspecified: Secondary | ICD-10-CM | POA: Diagnosis present

## 2023-12-15 DIAGNOSIS — K750 Abscess of liver: Secondary | ICD-10-CM | POA: Diagnosis present

## 2023-12-15 DIAGNOSIS — L89154 Pressure ulcer of sacral region, stage 4: Secondary | ICD-10-CM | POA: Diagnosis present

## 2023-12-15 DIAGNOSIS — R569 Unspecified convulsions: Secondary | ICD-10-CM

## 2023-12-15 DIAGNOSIS — R7989 Other specified abnormal findings of blood chemistry: Secondary | ICD-10-CM | POA: Diagnosis present

## 2023-12-15 DIAGNOSIS — N3000 Acute cystitis without hematuria: Secondary | ICD-10-CM | POA: Diagnosis not present

## 2023-12-15 DIAGNOSIS — K8309 Other cholangitis: Secondary | ICD-10-CM

## 2023-12-15 DIAGNOSIS — Z853 Personal history of malignant neoplasm of breast: Secondary | ICD-10-CM

## 2023-12-15 DIAGNOSIS — K769 Liver disease, unspecified: Secondary | ICD-10-CM | POA: Diagnosis present

## 2023-12-15 DIAGNOSIS — D631 Anemia in chronic kidney disease: Secondary | ICD-10-CM | POA: Diagnosis present

## 2023-12-15 DIAGNOSIS — I639 Cerebral infarction, unspecified: Secondary | ICD-10-CM | POA: Diagnosis present

## 2023-12-15 DIAGNOSIS — N186 End stage renal disease: Secondary | ICD-10-CM | POA: Diagnosis present

## 2023-12-15 DIAGNOSIS — R7881 Bacteremia: Secondary | ICD-10-CM | POA: Diagnosis not present

## 2023-12-15 DIAGNOSIS — R4182 Altered mental status, unspecified: Principal | ICD-10-CM

## 2023-12-15 LAB — URINALYSIS, W/ REFLEX TO CULTURE (INFECTION SUSPECTED)
Bilirubin Urine: NEGATIVE
Glucose, UA: NEGATIVE mg/dL
Ketones, ur: NEGATIVE mg/dL
Nitrite: NEGATIVE
Protein, ur: 100 mg/dL — AB
RBC / HPF: >50 RBC/hpf (ref 0–5)
Specific Gravity, Urine: 1.017 (ref 1.005–1.030)
WBC, UA: >50 WBC/hpf (ref 0–5)
pH: 7 (ref 5.0–8.0)

## 2023-12-15 LAB — URINE DRUG SCREEN, QUALITATIVE (ARMC ONLY)
Amphetamines, Ur Screen: NOT DETECTED
Barbiturates, Ur Screen: NOT DETECTED
Benzodiazepine, Ur Scrn: POSITIVE — AB
Cannabinoid 50 Ng, Ur ~~LOC~~: NOT DETECTED
Cocaine Metabolite,Ur ~~LOC~~: NOT DETECTED
MDMA (Ecstasy)Ur Screen: NOT DETECTED
Methadone Scn, Ur: NOT DETECTED
Opiate, Ur Screen: NOT DETECTED
Phencyclidine (PCP) Ur S: NOT DETECTED
Tricyclic, Ur Screen: NOT DETECTED

## 2023-12-15 LAB — COMPREHENSIVE METABOLIC PANEL WITH GFR
ALT: 67 U/L — ABNORMAL HIGH (ref 0–44)
AST: 214 U/L — ABNORMAL HIGH (ref 15–41)
Albumin: 2.1 g/dL — ABNORMAL LOW (ref 3.5–5.0)
Alkaline Phosphatase: 1035 U/L — ABNORMAL HIGH (ref 38–126)
Anion gap: 13 (ref 5–15)
BUN: 45 mg/dL — ABNORMAL HIGH (ref 8–23)
CO2: 26 mmol/L (ref 22–32)
Calcium: 8.8 mg/dL — ABNORMAL LOW (ref 8.9–10.3)
Chloride: 103 mmol/L (ref 98–111)
Creatinine, Ser: 3.61 mg/dL — ABNORMAL HIGH (ref 0.44–1.00)
GFR, Estimated: 13 mL/min — ABNORMAL LOW (ref >60)
Glucose, Bld: 139 mg/dL — ABNORMAL HIGH (ref 70–99)
Potassium: 3.6 mmol/L (ref 3.5–5.1)
Sodium: 142 mmol/L (ref 135–145)
Total Bilirubin: 0.9 mg/dL (ref 0.0–1.2)
Total Protein: 6.9 g/dL (ref 6.5–8.1)

## 2023-12-15 LAB — RESP PANEL BY RT-PCR (RSV, FLU A&B, COVID)  RVPGX2
Influenza A by PCR: NEGATIVE
Influenza B by PCR: NEGATIVE
Resp Syncytial Virus by PCR: NEGATIVE
SARS Coronavirus 2 by RT PCR: NEGATIVE

## 2023-12-15 LAB — CBC WITH DIFFERENTIAL/PLATELET
Abs Immature Granulocytes: 0.14 10*3/uL — ABNORMAL HIGH (ref 0.00–0.07)
Basophils Absolute: 0.1 10*3/uL (ref 0.0–0.1)
Basophils Relative: 0 %
Eosinophils Absolute: 0 10*3/uL (ref 0.0–0.5)
Eosinophils Relative: 0 %
HCT: 24.8 % — ABNORMAL LOW (ref 36.0–46.0)
Hemoglobin: 7.7 g/dL — ABNORMAL LOW (ref 12.0–15.0)
Immature Granulocytes: 1 %
Lymphocytes Relative: 5 %
Lymphs Abs: 0.9 10*3/uL (ref 0.7–4.0)
MCH: 28.7 pg (ref 26.0–34.0)
MCHC: 31 g/dL (ref 30.0–36.0)
MCV: 92.5 fL (ref 80.0–100.0)
Monocytes Absolute: 1.3 10*3/uL — ABNORMAL HIGH (ref 0.1–1.0)
Monocytes Relative: 7 %
Neutro Abs: 15.8 10*3/uL — ABNORMAL HIGH (ref 1.7–7.7)
Neutrophils Relative %: 87 %
Platelets: 414 10*3/uL — ABNORMAL HIGH (ref 150–400)
RBC: 2.68 MIL/uL — ABNORMAL LOW (ref 3.87–5.11)
RDW: 17.9 % — ABNORMAL HIGH (ref 11.5–15.5)
WBC: 18.2 10*3/uL — ABNORMAL HIGH (ref 4.0–10.5)
nRBC: 0 % (ref 0.0–0.2)

## 2023-12-15 LAB — LACTIC ACID, PLASMA
Lactic Acid, Venous: 1.4 mmol/L (ref 0.5–1.9)
Lactic Acid, Venous: 1.1 mmol/L (ref 0.5–1.9)

## 2023-12-15 LAB — CK: Total CK: 11 U/L — ABNORMAL LOW (ref 38–234)

## 2023-12-15 LAB — AMMONIA: Ammonia: <13 umol/L (ref 9–35)

## 2023-12-15 LAB — PROTIME-INR
INR: 1.3 — ABNORMAL HIGH (ref 0.8–1.2)
Prothrombin Time: 16 s — ABNORMAL HIGH (ref 11.4–15.2)

## 2023-12-15 LAB — TROPONIN I (HIGH SENSITIVITY)
Troponin I (High Sensitivity): 25 ng/L — ABNORMAL HIGH (ref <18)
Troponin I (High Sensitivity): 22 ng/L — ABNORMAL HIGH (ref <18)

## 2023-12-15 LAB — APTT: aPTT: 40 s — ABNORMAL HIGH (ref 24–36)

## 2023-12-15 MED ORDER — METRONIDAZOLE 500 MG/100ML IV SOLN: 500.0000 mg | Freq: Two times a day (BID) | INTRAVENOUS | Status: DC

## 2023-12-15 MED ORDER — NICOTINE 21 MG/24HR TD PT24
21.0000 mg | MEDICATED_PATCH | Freq: Every day | TRANSDERMAL | Status: DC
  Administered 2023-12-16 – 2023-12-23 (×8): 21 mg via TRANSDERMAL
  Filled 2023-12-15 (×9): qty 1

## 2023-12-15 MED ORDER — LACOSAMIDE 50 MG PO TABS
200.0000 mg | ORAL_TABLET | Freq: Two times a day (BID) | ORAL | Status: DC
  Administered 2023-12-16 – 2023-12-24 (×16): 200 mg
  Filled 2023-12-15 (×16): qty 4

## 2023-12-15 MED ORDER — ASPIRIN 81 MG PO CHEW
81.0000 mg | CHEWABLE_TABLET | Freq: Every day | ORAL | Status: DC
  Administered 2023-12-16 – 2023-12-24 (×8): 81 mg
  Filled 2023-12-15 (×8): qty 1

## 2023-12-15 MED ORDER — VANCOMYCIN HCL IN DEXTROSE 1-5 GM/200ML-% IV SOLN
1000.0000 mg | Freq: Once | INTRAVENOUS | Status: AC
  Administered 2023-12-15: 1000 mg via INTRAVENOUS
  Filled 2023-12-15: qty 200

## 2023-12-15 MED ORDER — HYDRALAZINE HCL 20 MG/ML IJ SOLN: 5.0000 mg | INTRAMUSCULAR | Status: DC | PRN

## 2023-12-15 MED ORDER — METRONIDAZOLE 500 MG/100ML IV SOLN
500.0000 mg | Freq: Once | INTRAVENOUS | Status: AC
  Administered 2023-12-15: 500 mg via INTRAVENOUS
  Filled 2023-12-15: qty 100

## 2023-12-15 MED ORDER — ONDANSETRON HCL 4 MG/2ML IJ SOLN: 4.0000 mg | Freq: Three times a day (TID) | INTRAMUSCULAR | Status: DC | PRN

## 2023-12-15 MED ORDER — LEVETIRACETAM 100 MG/ML PO SOLN
500.0000 mg | Freq: Two times a day (BID) | ORAL | Status: DC
  Administered 2023-12-16 – 2023-12-24 (×15): 500 mg
  Filled 2023-12-15 (×19): qty 5

## 2023-12-15 MED ORDER — SODIUM CHLORIDE 0.9 % IV SOLN: INTRAVENOUS | Status: AC

## 2023-12-15 MED ORDER — OXYCODONE HCL 5 MG PO TABS
5.0000 mg | ORAL_TABLET | ORAL | Status: DC | PRN
  Administered 2023-12-16 – 2023-12-23 (×13): 5 mg
  Filled 2023-12-15 (×14): qty 1

## 2023-12-15 MED ORDER — SODIUM CHLORIDE 0.9 % IV SOLN: INTRAVENOUS | Status: DC

## 2023-12-15 MED ORDER — IOHEXOL 300 MG/ML  SOLN
100.0000 mL | Freq: Once | INTRAMUSCULAR | Status: AC | PRN
  Administered 2023-12-15: 100 mL via INTRAVENOUS

## 2023-12-15 MED ORDER — SODIUM CHLORIDE 0.9 % IV BOLUS (SEPSIS)
500.0000 mL | Freq: Once | INTRAVENOUS | Status: AC
  Administered 2023-12-15: 500 mL via INTRAVENOUS

## 2023-12-15 MED ORDER — VANCOMYCIN HCL 750 MG/150ML IV SOLN
750.0000 mg | INTRAVENOUS | Status: DC
  Filled 2023-12-15: qty 150

## 2023-12-15 MED ORDER — IBUPROFEN 400 MG PO TABS
400.0000 mg | ORAL_TABLET | Freq: Once | ORAL | Status: AC
  Administered 2023-12-15: 400 mg
  Filled 2023-12-15: qty 1

## 2023-12-15 MED ORDER — OMEPRAZOLE 2 MG/ML ORAL SUSPENSION
20.0000 mg | Freq: Every day | ORAL | Status: DC
  Administered 2023-12-16 – 2023-12-19 (×3): 20 mg via ORAL
  Filled 2023-12-15 (×6): qty 10

## 2023-12-15 MED ORDER — ACETAMINOPHEN 325 MG RE SUPP
650.0000 mg | Freq: Once | RECTAL | Status: AC
  Administered 2023-12-15: 650 mg via RECTAL
  Filled 2023-12-15: qty 2

## 2023-12-15 MED ORDER — SEVELAMER CARBONATE 800 MG PO TABS
800.0000 mg | ORAL_TABLET | Freq: Three times a day (TID) | ORAL | Status: DC
  Administered 2023-12-17 – 2023-12-24 (×17): 800 mg
  Filled 2023-12-15 (×25): qty 1

## 2023-12-15 MED ORDER — HYDROXYZINE HCL 25 MG PO TABS
25.0000 mg | ORAL_TABLET | Freq: Three times a day (TID) | ORAL | Status: DC | PRN
  Administered 2023-12-16 – 2023-12-23 (×4): 25 mg via ORAL
  Filled 2023-12-15 (×6): qty 1

## 2023-12-15 MED ORDER — IBUPROFEN 400 MG PO TABS
400.0000 mg | ORAL_TABLET | Freq: Four times a day (QID) | ORAL | Status: DC | PRN
  Administered 2023-12-18 – 2023-12-23 (×4): 400 mg via ORAL
  Filled 2023-12-15 (×4): qty 1

## 2023-12-15 MED ORDER — HEPARIN SODIUM (PORCINE) 5000 UNIT/ML IJ SOLN
5000.0000 [IU] | Freq: Three times a day (TID) | INTRAMUSCULAR | Status: DC
  Administered 2023-12-15 – 2023-12-24 (×24): 5000 [IU] via SUBCUTANEOUS
  Filled 2023-12-15 (×23): qty 1

## 2023-12-15 MED ORDER — SODIUM CHLORIDE 0.9 % IV SOLN: 1.0000 g | INTRAVENOUS | Status: DC

## 2023-12-15 MED ORDER — ACETAMINOPHEN 325 MG RE SUPP: 650.0000 mg | Freq: Four times a day (QID) | RECTAL | Status: DC | PRN

## 2023-12-15 MED ORDER — CLOBAZAM 2.5 MG/ML PO SUSP
5.0000 mg | Freq: Two times a day (BID) | ORAL | Status: DC
  Administered 2023-12-15 – 2023-12-24 (×16): 5 mg
  Filled 2023-12-15 (×16): qty 4

## 2023-12-15 MED ORDER — VANCOMYCIN HCL 500 MG/100ML IV SOLN
500.0000 mg | Freq: Once | INTRAVENOUS | Status: AC
  Administered 2023-12-15: 500 mg via INTRAVENOUS
  Filled 2023-12-15: qty 100

## 2023-12-15 MED ORDER — LORAZEPAM 2 MG/ML IJ SOLN: 2.0000 mg | INTRAMUSCULAR | Status: DC | PRN

## 2023-12-15 MED ORDER — SENNOSIDES-DOCUSATE SODIUM 8.6-50 MG PO TABS: 1.0000 | ORAL_TABLET | Freq: Every evening | ORAL | Status: DC | PRN

## 2023-12-15 MED ORDER — IBUPROFEN 400 MG PO TABS: 400.0000 mg | ORAL_TABLET | Freq: Four times a day (QID) | ORAL | Status: DC | PRN

## 2023-12-15 MED ORDER — MUPIROCIN 2 % EX OINT
1.0000 | TOPICAL_OINTMENT | Freq: Two times a day (BID) | CUTANEOUS | Status: DC
  Administered 2023-12-16 – 2023-12-24 (×16): 1 via TOPICAL
  Filled 2023-12-15 (×3): qty 22

## 2023-12-15 MED ORDER — SERTRALINE HCL 50 MG PO TABS
25.0000 mg | ORAL_TABLET | Freq: Every day | ORAL | Status: DC
  Administered 2023-12-16 – 2023-12-20 (×4): 25 mg via ORAL
  Filled 2023-12-15 (×4): qty 1

## 2023-12-15 MED ORDER — SODIUM CHLORIDE 0.9 % IV SOLN
2.0000 g | Freq: Once | INTRAVENOUS | Status: AC
  Administered 2023-12-15: 2 g via INTRAVENOUS
  Filled 2023-12-15: qty 12.5

## 2023-12-15 NOTE — ED Notes (Signed)
 IV team order put in and Lab called to help collect labs and establish IV.

## 2023-12-15 NOTE — Progress Notes (Signed)
 Pharmacy Antibiotic Note  Sherry [name] is a 68 y.o. female admitted on 12/15/2023 with sepsis.  Pharmacy has been consulted for Vancomycin  dosing.  Per RN, pt is on HD Q T-Th-Sat  Plan: Vancomycin  1 gm IV X 1 given in ED on 6/4 @ 2004.  Additional Vanc 500 mg IV X 1 ordered to make total loading dose of 1500 mg.  Vancomycin  750 mg IV T-Th-Sat to start on 6/5 @ 1200.   Height: 5\' 2"  (157.5 cm) Weight: 66.8 kg (147 lb 4.8 oz) IBW/kg (Calculated) : 50.1  Temp (24hrs), Avg:101.4 F (38.6 C), Min:100.5 F (38.1 C), Max:102.9 F (39.4 C)  Recent Labs  Lab 12/15/23 1630 12/15/23 1819  WBC  --  18.2*  CREATININE  --  3.61*  LATICACIDVEN 1.4 1.1    Estimated Creatinine Clearance: 13.4 mL/min (A) (by C-G formula based on SCr of 3.61 mg/dL (H)).    Allergies  Allergen Reactions   Abilify [Aripiprazole] Other (See Comments)    Tardive dyskinesia Oral   Remeron  [Mirtazapine ] Other (See Comments)    Wgt stimulation /gain, Dizziness, Patient says "can tolerate"   Trazodone  And Nefazodone Other (See Comments)    Nightmares/sleep diturbance   Augmentin [Amoxicillin -Pot Clavulanate]    Flexeril [Cyclobenzaprine] Other (See Comments)    Pt states Flexeril makes her feel depressed    Amoxicillin  Diarrhea    Antimicrobials this admission:   >>    >>   Dose adjustments this admission:   Microbiology results:  BCx:   UCx:    Sputum:    MRSA PCR:   Thank you for allowing pharmacy to be a part of this patient's care.  Jw Covin D 12/15/2023 11:12 PM

## 2023-12-15 NOTE — Consult Note (Signed)
 CODE SEPSIS - PHARMACY COMMUNICATION  **Broad-spectrum antimicrobials should be administered within one hour of sepsis diagnosis**  Time Code Sepsis call or page was received: 1548  Antibiotics ordered: Cefepime, Vancomycin , Metronidazole  Time of first antibiotic administration: 1725 Patient was a hard stick and required multiple attempts. Antibiotic was hung as soon as IV access was attained  Additional action taken by pharmacy: Messaged RN  If necessary, name of provider/nurse contacted: Eulogio Hidalgo. Alva Jewels, PharmD Clinical Pharmacist 12/15/2023 3:56 PM

## 2023-12-15 NOTE — ED Triage Notes (Signed)
 Normally patient is AOX4.  Patient has dialysis normally as well. AMS when EMS got there. Unknown how long patient has been AMS. Responds to painful stimuli. T: 101 with [facility] and gave tylenol  that did not help. EMS T: 102.4 axillary. BP: 151/71, 93, 154CBG.

## 2023-12-15 NOTE — Sepsis Progress Note (Signed)
 eLink is following this Code Sepsis.

## 2023-12-15 NOTE — ED Notes (Signed)
 EDP in room attempting to get IV with US .

## 2023-12-15 NOTE — H&P (Signed)
 History and Physical    Sherry [name] ZOX:[id number] DOB: 1956/04/25 DOA: 12/15/2023  Referring MD/NP/PA:   PCP: Tita Form, MD   Patient coming from:  The patient is coming from SNF     Chief Complaint: AMS, fever  HPI: Sherry [name] is a 68 y.o. female with medical history significant of ESRD on HD (TTS), seizure, chronic biliary duct dilation in the setting of a cholecystectomy which is felt to be physiological and normal per GI consult (this is documented by Dr. Gordon Latus on 02/24/22), stroke, s/p of gastrostomy, hypertension, diastolic CHF, stroke, depression with anxiety, bipolar, anemia,HCV, breast cancer (s/p right mastectomy), polypharmacy, who presents with AMS and fever.  Patient has AMS,  and is unable to provide accurate medical history, therefore, most of the history is obtained by discussing the case with ED physician, per EMS report, and with the nursing staff. I also called her sister by phone who provided some medical history.  Per report, pt is alert and oriented x 3 at baseline.  She was noted to be confused with fever of 102.4 today. When I saw pt in ED, pt is not arousable, not following command, moves extremity slightly with painful stimuli.  No active nausea, vomiting, diarrhea noted, no active respiratory distress, cough, SOB noted.  Does not seem to have chest pain or abdominal pain.  Patient has gastrostomy tube placement in left side of abdomen with clean surroundings.  She has dialysis catheter on the right upper chest without surrounding erythema.  Patient has a large chronic sacral wound.  Data reviewed independently and ED Course: pt was found to have WBC 18.2, lactic acid of 1.4 --> 1.1, potassium 3.6, positive UA (turbid appearance, large amount of leukocyte, many bacteria, WBC > 50, squamous cell 11-20), negative PCR for COVID, flu and RSV, ammonia level<13, troponin 25 --> 22, abnormal liver function (ALP 1035, AST 214, ALT 67, total bilirubin 0.9). Blood pressure  150/72 --> 119/58, heart rate 95, RR 21, oxygen saturation 94% on room air.  Chest x-ray negative.  CT of head negative.  Patient is admitted to PCU as inpatient.  Dr. Zelda Hickman of renal is consulted.  CT of abdomen/pelvis/chest: 1. Diffuse biliary duct dilatation with several small hypodense nodular areas in the liver concerning for intrahepatic abscesses versus less likely metastatic disease. Further evaluation with MRI/MRCP is recommended. 2. Bibasilar subpleural atelectasis. Pneumonia is less likely but not excluded. 3. 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 4. Constipation with possible fecal impaction in the rectal vault. No bowel obstruction. Normal appendix. 5.  Aortic Atherosclerosis (ICD10-I70.0).   MRCP pending: ***   EKG: I have personally reviewed.  Sinus rhythm, QTc 437, LAE, borderline LAD, ST depression in lead I/aVL.   Review of Systems: Could not be reviewed due to altered mental status.   Allergy:  Allergies  Allergen Reactions   Abilify [Aripiprazole] Other (See Comments)    Tardive dyskinesia Oral   Remeron  [Mirtazapine ] Other (See Comments)    Wgt stimulation /gain, Dizziness, Patient says "can tolerate"   Trazodone  And Nefazodone Other (See Comments)    Nightmares/sleep diturbance   Augmentin [Amoxicillin -Pot Clavulanate]    Flexeril [Cyclobenzaprine] Other (See Comments)    Pt states Flexeril makes her feel depressed    Amoxicillin  Diarrhea    Past Medical History:  Diagnosis Date   Alcohol  use disorder, severe, dependence (HCC) 08/27/2017   Anxiety    Body mass index (BMI) 40.0-44.9, adult (HCC) 07/19/2020   Breast cancer (  HCC)    right lumpectemy and lymph node    Cervical spondylosis 05/24/2020   Chronic bilateral low back pain with right-sided sciatica 05/24/2020   Chronic diastolic CHF (congestive heart failure) (HCC) 05/09/2019   Cigarette smoker 12/31/2017   ESRD (end stage renal disease) on dialysis  (HCC) 08/22/2021   M-W-F   Essential hypertension 07/06/2017   GERD (gastroesophageal reflux disease)    Hepatitis C virus infection cured after antiviral drug therapy 12/17/2012   Telephone Encounter - Nerissa Bannister, RN - 12/28/2016 9:54 AM EDT Patient called for HCV RNA test results. EOT 08-26-16 - not detected. 12 week post treatment 12-11-16 - not detected. Informed patient that she was cured. Patient verbalized understanding.          Hx of bipolar disorder 01/31/2016   Impaired functional mobility, balance, gait, and endurance 01/09/2021   MRSA infection    of breast incision   Neuroleptic-induced tardive dyskinesia 09/06/2017   Abilify   Opioid use disorder, severe, dependence (HCC) 08/27/2017   Restless legs syndrome 02/13/2013   Formatting of this note might be different from the original. IMPRESSION: Possible. Will follow.   Subacute dyskinesia due to drug 02/13/2013   Formatting of this note might be different from the original. STORY: Tardive dyskinesia and mild limb dyskinesia, LE>UE which likely due to prolonged antipsychotic used, abilify. Couldn't tolerate artane, gabapentin , clonazepam and xenazine.`E1o3L`IMPRESSION: Well tolerated depakote  250mg  bid, mood aspect is better as well. Will increase to 500mg  bid. ADR was discussed.  RTC 4-6 weeks.   TIA (transient ischemic attack) 2020   P/w BP >230/120 and neurologic symptoms, presumed TIA    Past Surgical History:  Procedure Laterality Date   A/V FISTULAGRAM Right 01/29/2022   Procedure: A/V Fistulagram;  Surgeon: Young Hensen, MD;  [facility] INVASIVE CV LAB;  Service: Cardiovascular;  Laterality: Right;   AV FISTULA PLACEMENT Right 05/01/2021   Procedure: RIGHT ARTERIOVENOUS (AV) FISTULA CREATION;  Surgeon: Kayla Part, MD;  Location: [facility] OR;  Service: Vascular;  Laterality: Right;  PERIPHERAL NERVE BLOCK   BACK SURGERY  1990   BASCILIC VEIN TRANSPOSITION Right 09/02/2021   Procedure: RIGHT SECOND STAGE BASILIC  VEIN TRANSPOSITION;  Surgeon: Kayla Part, MD;  Location: [facility] OR;  Service: Vascular;  Laterality: Right;  PERIPHERAL NERVE BLOCK   BREAST SURGERY Right 2010   "breast cancer survivor" - states partial mastectomy and nodes   COLONOSCOPY     [facility]   ESOPHAGOGASTRODUODENOSCOPY     [facility]   ESOPHAGOGASTRODUODENOSCOPY  04/18/2020   [facility]   LAPAROSCOPIC CHOLECYSTECTOMY  2002   LAPAROSCOPIC INCISIONAL / UMBILICAL / VENTRAL HERNIA REPAIR  04/13/2018   with BARD 15x 20cm mesh (supraumbilical)   LAPAROSCOPIC LYSIS OF ADHESIONS  07/12/2018   Procedure: LAPAROSCOPIC LYSIS OF ADHESIONS;  Surgeon: Lyn Sanders, MD;  Location: [facility] ORS;  Service: Gynecology;;   MULTIPLE TOOTH EXTRACTIONS     MYOMECTOMY     x 2 prior to hysterectomy   PERIPHERAL VASCULAR BALLOON ANGIOPLASTY Right 01/29/2022   Procedure: PERIPHERAL VASCULAR BALLOON ANGIOPLASTY;  Surgeon: Young Hensen, MD;  Location: [facility] INVASIVE CV LAB;  Service: Cardiovascular;  Laterality: Right;  arm fistula   ROBOTIC ASSISTED BILATERAL SALPINGO OOPHERECTOMY Right 07/12/2018   Procedure: XI ROBOTIC ASSISTED RIGHT SALPINGO OOPHORECTOMY;  Surgeon: Lyn Sanders, MD;  Location: [facility] ORS;  Service: Gynecology;  Laterality: Right;   TOTAL ABDOMINAL HYSTERECTOMY     fibroids    UPPER GASTROINTESTINAL  ENDOSCOPY     WISDOM TOOTH EXTRACTION      Social History:  reports that she has been smoking cigarettes. She has never been exposed to tobacco smoke. She has never used smokeless tobacco. She reports that she does not currently use alcohol . She reports that she does not currently use drugs.  Family History:  Family History  Problem Relation Age of Onset   Heart attack Mother    Breast cancer Mother 66   Dementia Mother    Cancer Father 70       died of bleeding from kidneys   Colon cancer Neg Hx    Esophageal cancer Neg Hx    Stomach cancer Neg Hx    Rectal cancer Neg Hx      Prior to Admission  medications   Medication Sig Start Date End Date Taking? Authorizing Provider  carvedilol  (COREG ) 12.5 MG tablet Take 25 mg by mouth 2 (two) times daily.    [provider]  diazepam  (VALIUM ) 10 MG tablet Take 0.5 tablets (5 mg total) by mouth as directed. 05/27/23   Adria Hopkins, MD  hydrALAZINE  (APRESOLINE ) 50 MG tablet Take 50 mg by mouth 3 (three) times daily. 03/24/21   [provider]  pantoprazole  (PROTONIX ) 40 MG tablet Take 1 tablet (40 mg total) by mouth daily. 02/01/22 05/26/23  Ozell Blunt, MD  senna-docusate (SENOKOT-S) 8.6-50 MG tablet Take 1 tablet by mouth at bedtime as needed for mild constipation. 03/02/23   Long, Joshua G, MD  sertraline (ZOLOFT) 25 MG tablet Take 25 mg by mouth daily.    [provider]  traMADol  (ULTRAM ) 50 MG tablet Take 50-100 mg by mouth 3 (three) times daily as needed for severe pain.    [provider]    Physical Exam: Vitals:   12/15/23 1905 12/15/23 1930 12/15/23 2000 12/15/23 2100  BP:  (!) 111/51 128/61 (!) 105/53  Pulse:  88 90 88  Resp:  18 20 19   Temp: (!) 100.5 F (38.1 C)   (!) 100.9 F (38.3 C)  TempSrc: Oral   Axillary  SpO2:  94% 92% 96%  Weight:      Height:       General: Not in acute distress.  Dry mucous membrane HEENT:       Eyes: PERRL, EOMI, no jaundice       ENT: No discharge from the ears and nose       Neck: No JVD, no bruit, no mass felt. Heme: No neck lymph node enlargement. Cardiac: S1/S2, RRR, No murmurs, No gallops or rubs. Respiratory: No rales, wheezing, rhonchi or rubs. GI: Soft, nondistended, nontender, no organomegaly, BS present.  S/p of gastrostomy tube GU: No hematuria Ext: No pitting leg edema bilaterally. 1+DP/PT pulse bilaterally. Musculoskeletal: No joint deformities, No joint redness or warmth, no limitation of ROM in spin. Skin: has a large stage IV sacral wound      Neuro: Patient is not arousable, not following command, slightly moves extremities on  painful stimuli.  Psych: Patient is not psychotic, no suicidal or hemocidal ideation.  Labs on Admission: I have personally reviewed following labs and imaging studies  CBC: Recent Labs  Lab 12/15/23 1819  WBC 18.2*  NEUTROABS 15.8*  HGB 7.7*  HCT 24.8*  MCV 92.5  PLT 414*   Basic Metabolic Panel: Recent Labs  Lab 12/15/23 1819  NA 142  K 3.6  CL 103  CO2 26  GLUCOSE 139*  BUN 45*  CREATININE 3.61*  CALCIUM  8.8*   GFR: Estimated Creatinine Clearance: 13.4 mL/min (A) (by C-G formula based on SCr of 3.61 mg/dL (H)). Liver Function Tests: Recent Labs  Lab 12/15/23 1819  AST 214*  ALT 67*  ALKPHOS 1,035*  BILITOT 0.9  PROT 6.9  ALBUMIN 2.1*   No results for input(s): "LIPASE", "AMYLASE" in the last 168 hours. Recent Labs  Lab 12/15/23 1819  AMMONIA <13   Coagulation Profile: Recent Labs  Lab 12/15/23 1820  INR 1.3*   Cardiac Enzymes: Recent Labs  Lab 12/15/23 1819  CKTOTAL 11*   BNP (last 3 results) No results for input(s): "PROBNP" in the last 8760 hours. HbA1C: No results for input(s): "HGBA1C" in the last 72 hours. CBG: No results for input(s): "GLUCAP" in the last 168 hours. Lipid Profile: No results for input(s): "CHOL", "HDL", "LDLCALC", "TRIG", "CHOLHDL", "LDLDIRECT" in the last 72 hours. Thyroid Function Tests: No results for input(s): "TSH", "T4TOTAL", "FREET4", "T3FREE", "THYROIDAB" in the last 72 hours. Anemia Panel: No results for input(s): "VITAMINB12", "FOLATE", "FERRITIN", "TIBC", "IRON", "RETICCTPCT" in the last 72 hours. Urine analysis:    Component Value Date/Time   COLORURINE AMBER (A) 12/15/2023 1740   APPEARANCEUR TURBID (A) 12/15/2023 1740   LABSPEC 1.017 12/15/2023 1740   PHURINE 7.0 12/15/2023 1740   GLUCOSEU NEGATIVE 12/15/2023 1740   HGBUR SMALL (A) 12/15/2023 1740   BILIRUBINUR NEGATIVE 12/15/2023 1740   KETONESUR NEGATIVE 12/15/2023 1740   PROTEINUR 100 (A) 12/15/2023 1740   UROBILINOGEN 0.2 01/12/2010 1941    NITRITE NEGATIVE 12/15/2023 1740   LEUKOCYTESUR LARGE (A) 12/15/2023 1740   Sepsis Labs: @LABRCNTIP (procalcitonin:4,lacticidven:4) ) Recent Results (from the past 240 hours)  Resp panel by RT-PCR (RSV, Flu A&B, Covid) Anterior Nasal Swab     Status: None   Collection Time: 12/15/23  4:11 PM   Specimen: Anterior Nasal Swab  Result Value Ref Range Status   SARS Coronavirus 2 by RT PCR NEGATIVE NEGATIVE Final    Comment: (NOTE) SARS-CoV-2 target nucleic acids are NOT DETECTED.  The SARS-CoV-2 RNA is generally detectable in upper respiratory specimens during the acute phase of infection. The lowest concentration of SARS-CoV-2 viral copies this assay can detect is 138 copies/mL. A negative result does not preclude SARS-Cov-2 infection and should not be used as the sole basis for treatment or other patient management decisions. A negative result may occur with  improper specimen collection/handling, submission of specimen other than nasopharyngeal swab, presence of viral mutation(s) within the areas targeted by this assay, and inadequate number of viral copies(<138 copies/mL). A negative result must be combined with clinical observations, patient history, and epidemiological information. The expected result is Negative.  Fact Sheet for Patients:  BloggerCourse.com  Fact Sheet for Healthcare Providers:  SeriousBroker.it  This test is no t yet approved or cleared by the [place]  FDA and  has been authorized for detection and/or diagnosis of SARS-CoV-2 by FDA under an Emergency Use Authorization (EUA). This EUA will remain  in effect (meaning this test can be used) for the duration of the COVID-19 declaration under Section 564(b)(1) of the Act, 21 U.S.C.section 360bbb-3(b)(1), unless the authorization is terminated  or revoked sooner.       Influenza A by PCR NEGATIVE NEGATIVE Final   Influenza B by PCR NEGATIVE NEGATIVE  Final    Comment: (NOTE) The Xpert Xpress SARS-CoV-2/FLU/RSV plus assay is intended as an aid in the diagnosis of influenza from Nasopharyngeal swab specimens and should not be used as a sole basis for treatment.  Nasal washings and aspirates are unacceptable for Xpert Xpress SARS-CoV-2/FLU/RSV testing.  Fact Sheet for Patients: BloggerCourse.com  Fact Sheet for Healthcare Providers: SeriousBroker.it  This test is not yet approved or cleared by the [place]  FDA and has been authorized for detection and/or diagnosis of SARS-CoV-2 by FDA under an Emergency Use Authorization (EUA). This EUA will remain in effect (meaning this test can be used) for the duration of the COVID-19 declaration under Section 564(b)(1) of the Act, 21 U.S.C. section 360bbb-3(b)(1), unless the authorization is terminated or revoked.     Resp Syncytial Virus by PCR NEGATIVE NEGATIVE Final    Comment: (NOTE) Fact Sheet for Patients: BloggerCourse.com  Fact Sheet for Healthcare Providers: SeriousBroker.it  This test is not yet approved or cleared by the [place]  FDA and has been authorized for detection and/or diagnosis of SARS-CoV-2 by FDA under an Emergency Use Authorization (EUA). This EUA will remain in effect (meaning this test can be used) for the duration of the COVID-19 declaration under Section 564(b)(1) of the Act, 21 U.S.C. section 360bbb-3(b)(1), unless the authorization is terminated or revoked.  Performed at [facility], [address] [place], [place] [address]   Aerobic/Anaerobic Culture w Gram Stain (surgical/deep wound)     Status: None (Preliminary result)   Collection Time: 12/15/23  4:12 PM   Specimen: Wound  Result Value Ref Range Status   Specimen Description   Final    WOUND Performed at [facility], [address], [place], [place] [address]     Special Requests   Final    SACRAL Performed at [facility], [address] [place], [place] [address]    Gram Stain   Final    NO WBC SEEN RARE GRAM NEGATIVE RODS Performed at [facility], [address], [place], [place] [address]    Culture PENDING  Incomplete   Report Status PENDING  Incomplete     Radiological Exams on Admission:   Assessment/Plan Principal Problem:   Sepsis (HCC) Active Problems:   Liver lesion   Abnormal liver function   UTI (urinary tract infection)   Acute metabolic encephalopathy   ESRD on dialysis [facility])   Essential hypertension   Chronic diastolic CHF (congestive heart failure) (HCC)   Stroke (HCC)   Seizure (HCC)   Cigarette smoker   Hx of bipolar disorder   Sacral wound   Assessment and Plan:   Principal Problem:   Sepsis (HCC) Active Problems:   Liver lesion   Abnormal liver function   UTI (urinary tract infection)   Acute metabolic encephalopathy   ESRD on dialysis (HCC)   Essential hypertension   Chronic diastolic CHF (congestive heart failure) (HCC)   TIA (transient ischemic attack)   Cigarette smoker   Hx of bipolar disorder   Sacral wound         DVT ppx: SQ Heparin      Code Status: Full code per her sister  Family Communication:   Yes, patient's sister at bed side.      Disposition Plan:  Anticipate discharge back to previous environment, SNF  Consults called:  Dr. Zelda Hickman of renal is consulted.  Admission status and Level of care: Progressive:  as inpt        Dispo: The patient is from: SNF              Anticipated d/c is to: SNF              Anticipated d/c  date is: 2 days              Patient currently is not medically stable to d/c.    Severity of Illness:  The appropriate patient status for this patient is INPATIENT. Inpatient status is judged to be reasonable and necessary in order to provide the required intensity of service to ensure the patient's safety. The patient's  presenting symptoms, physical exam findings, and initial radiographic and laboratory data in the context of their chronic comorbidities is felt to place them at high risk for further clinical deterioration. Furthermore, it is not anticipated that the patient will be medically stable for discharge from the hospital within 2 midnights of admission.   * I certify that at the point of admission it is my clinical judgment that the patient will require inpatient hospital care spanning beyond 2 midnights from the point of admission due to high intensity of service, high risk for further deterioration and high frequency of surveillance required.*       Date of Service 12/15/2023    [facility]   If 7PM-7AM, please contact night-coverage [email or web address] 12/15/2023, 10:40 PM

## 2023-12-15 NOTE — ED Provider Notes (Signed)
 Sherry [name] Provider Note    Event Date/Time   First MD Initiated Contact with Patient 12/15/23 1546     (approximate)   History   Altered Mental Status   HPI  Sherry [name] is a 68 y.o. female with history of ESRD on dialysis, chronic lower back pain, previous history of CVA, bipolar disorder, tardive dyskinesia, seizure, presenting with fever and altered mental status.  Per EMS patient is coming from [facility], unknown last normal, they state that she is typically ANO x 4, responding to painful stimuli, Tmax for them was 1-2.4, blood glucose was 154.  Independent history obtained from EMS as above.   On independent chart review, she was admitted in 2024 for acute encephalopathy thought secondary to polypharmacy, she was on diazepam  at the time that was reduced.  Gabapentin  was discontinued during that admission.  Physical Exam   Triage Vital Signs: ED Triage Vitals  Encounter Vitals Group     BP      Systolic BP Percentile      Diastolic BP Percentile      Pulse      Resp      Temp      Temp src      SpO2      Weight      Height      Head Circumference      Peak Flow      Pain Score      Pain Loc      Pain Education      Exclude from Growth Chart     Most recent vital signs: Vitals:   12/15/23 1900 12/15/23 1905  BP: (!) 124/56   Pulse: 89   Resp: 20   Temp:  (!) 100.5 F (38.1 C)  SpO2: 93%      General: Eyes closed, she is fighting me when I do try to open her eyes. CV:  Good peripheral perfusion.  Resp:  Normal effort.  Right thoracic HD cath without any purulent drainage, no surrounding erythema, induration, fluctuance Abd:  No distention.  Abdomen soft nontender, left G-tube in place without any tenderness to the area, no overlying erythema, purulent drainage, fluctuance, induration Other:  Pupils are equal and reactive, warm to touch, when she was turned over to her side, she does have a sacral ulcer, does not  appear to go to bone but there is purulent drainage, small area of necrosis on the left side, it is foul-smelling.  She is moving her extremities   ED Results / Procedures / Treatments   Labs (all labs ordered are listed, but only abnormal results are displayed) Labs Reviewed  URINALYSIS, W/ REFLEX TO CULTURE (INFECTION SUSPECTED) - Abnormal; Notable for the following components:      Result Value   Color, Urine AMBER (*)    APPearance TURBID (*)    Hgb urine dipstick SMALL (*)    Protein, ur 100 (*)    Leukocytes,Ua LARGE (*)    Bacteria, UA MANY (*)    All other components within normal limits  PROTIME-INR - Abnormal; Notable for the following components:   Prothrombin Time 16.0 (*)    INR 1.3 (*)    All other components within normal limits  CBC WITH DIFFERENTIAL/PLATELET - Abnormal; Notable for the following components:   WBC 18.2 (*)    RBC 2.68 (*)    Hemoglobin 7.7 (*)    HCT 24.8 (*)    RDW 17.9 (*)  Platelets 414 (*)    Neutro Abs 15.8 (*)    Monocytes Absolute 1.3 (*)    Abs Immature Granulocytes 0.14 (*)    All other components within normal limits  CK - Abnormal; Notable for the following components:   Total CK 11 (*)    All other components within normal limits  COMPREHENSIVE METABOLIC PANEL WITH GFR - Abnormal; Notable for the following components:   Glucose, Bld 139 (*)    BUN 45 (*)    Creatinine, Ser 3.61 (*)    Calcium  8.8 (*)    Albumin 2.1 (*)    AST 214 (*)    ALT 67 (*)    Alkaline Phosphatase 1,035 (*)    GFR, Estimated 13 (*)    All other components within normal limits  TROPONIN I (HIGH SENSITIVITY) - Abnormal; Notable for the following components:   Troponin I (High Sensitivity) 25 (*)    All other components within normal limits  TROPONIN I (HIGH SENSITIVITY) - Abnormal; Notable for the following components:   Troponin I (High Sensitivity) 22 (*)    All other components within normal limits  RESP PANEL BY RT-PCR (RSV, FLU A&B, COVID)   RVPGX2  CULTURE, BLOOD (ROUTINE X 2)  CULTURE, BLOOD (ROUTINE X 2)  AEROBIC/ANAEROBIC CULTURE W GRAM STAIN (SURGICAL/DEEP WOUND)  LACTIC ACID, PLASMA  LACTIC ACID, PLASMA  AMMONIA  CBC WITH DIFFERENTIAL/PLATELET     EKG  EKG shows, sinus rhythm, rate 94, normal QRS, normal QTc, T wave flattening in the lateral leads, T wave inversion to aVL and 1, no ischemic ST elevation, T wave inversions new compared to prior   RADIOLOGY On my independent interpretation, CT head without obvious intracranial hemorrhage   PROCEDURES:  Critical Care performed: Yes, see critical care procedure note(s)  .Critical Care  Performed by: Shane Darling, MD Authorized by: Shane Darling, MD   Critical care provider statement:    Critical care time (minutes):  40   Critical care was necessary to treat or prevent imminent or life-threatening deterioration of the following conditions:  Sepsis   Critical care was time spent personally by me on the following activities:  Development of treatment plan with patient or surrogate, discussions with consultants, evaluation of patient's response to treatment, examination of patient, ordering and review of laboratory studies, ordering and review of radiographic studies, ordering and performing treatments and interventions, pulse oximetry, re-evaluation of patient's condition and review of old charts    MEDICATIONS ORDERED IN ED: Medications  metroNIDAZOLE (FLAGYL) IVPB 500 mg (500 mg Intravenous New Bag/Given 12/15/23 1850)  vancomycin  (VANCOCIN ) IVPB 1000 mg/200 mL premix (has no administration in time range)  acetaminophen  (TYLENOL ) suppository 650 mg (has no administration in time range)  sodium chloride  0.9 % bolus 500 mL (0 mLs Intravenous Stopped 12/15/23 1856)  ceFEPIme (MAXIPIME) 2 g in sodium chloride  0.9 % 100 mL IVPB (0 g Intravenous Stopped 12/15/23 1819)  acetaminophen  (TYLENOL ) suppository 650 mg (650 mg Rectal Given 12/15/23 1723)  iohexol  (OMNIPAQUE ) 300  MG/ML solution 100 mL (100 mLs Intravenous Contrast Given 12/15/23 1827)     IMPRESSION / MDM / ASSESSMENT AND PLAN / ED COURSE  I reviewed the triage vital signs and the nursing notes.                              Differential diagnosis includes, but is not limited to, infected sacral ulcer, UTI, pneumonia, electrolyte derangements,  patient does not appear overtly fluid overloaded at this time, unclear when her last dialysis session was, atypical ACS, hyperammonemia, polypharmacy.  Will get labs, EKG, blood cultures, lactic acid, UA, give her some IV fluids now, will empirically treat with IV antibiotics, get a CT head, CT chest abdomen pelvis.  She will need to be admitted for further management.  Patient's presentation is most consistent with acute presentation with potential threat to life or bodily function.  Independent interpretation of labs and imaging below.  Patient is difficult IV stick, nurses attempted, attempted to look on ultrasound, she has been there either deep or small, had 1 at the right antecubital fossa that I attempted but given that it bifurcates on both ends, was able to get blood draw back but not able to thread catheter, IV stick team was consulted and they are at bedside putting an IV.  Given sepsis, UTI, biliary dilatation with possible hepatic abscesses, she will need to be admitted for further management.  Consulted hospitalist was agreeable with plan for admission and will evaluate the patient.  She is admitted.  The patient is on the cardiac monitor to evaluate for evidence of arrhythmia and/or significant heart rate changes.   Clinical Course as of 12/15/23 1945  Wed Dec 15, 2023  1816 DG Chest [employer] 1 View Shallow inspiration.  No evidence of active pulmonary disease.  [TT]  1816 Urinalysis, w/ Reflex to Culture (Infection Suspected) -Urine, Catheterized(!) UA is a cath sample, consistent with UTI.  She is already getting broad-spectrum IV antibiotics. [TT]   1909 CT Head Wo Contrast IMPRESSION: 1. No acute intracranial abnormality. 2. Stable atrophy, chronic small vessel ischemia, and remote infarcts.   [TT]  1910 CT Head Wo Contrast IMPRESSION: 1. Diffuse biliary duct dilatation with several small hypodense nodular areas in the liver concerning for intrahepatic abscesses versus less likely metastatic disease. Further evaluation with MRI/MRCP is recommended. 2. Bibasilar subpleural atelectasis. Pneumonia is less likely but not excluded. 3. 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 4. Constipation with possible fecal impaction in the rectal vault. No bowel obstruction. Normal appendix. 5.  Aortic Atherosclerosis (ICD10-I70.0).   [TT]  1937 Independent review of labs, she has a leukocytosis, electrolytes severely deranged, her LFTs are elevated, lipase is normal, CK is not elevated, troponin is mildly elevated.  Ammonia level is not elevated. [TT]    Clinical Course User Index [TT] Drenda Gentle Richard Champion, MD     FINAL CLINICAL IMPRESSION(S) / ED DIAGNOSES   Final diagnoses:  Altered mental status, unspecified altered mental status type  Sepsis, due to unspecified organism, unspecified whether acute organ dysfunction present [facility])  Urinary tract infection without hematuria, site unspecified  Elevated LFTs  Dilation of biliary tract  Liver lesion     Rx / DC Orders   ED Discharge Orders     None        Note:  This document was prepared using Dragon voice recognition software and may include unintentional dictation errors.    Shane Darling, MD 12/15/23 [phone]

## 2023-12-15 NOTE — ED Notes (Signed)
 Delay in antibiotics and obtaining lab work due to hard IV stick. Two RNS and EDP attempting.

## 2023-12-16 ENCOUNTER — Other Ambulatory Visit: Payer: Self-pay

## 2023-12-16 ENCOUNTER — Inpatient Hospital Stay

## 2023-12-16 DIAGNOSIS — B9620 Unspecified Escherichia coli [E. coli] as the cause of diseases classified elsewhere: Secondary | ICD-10-CM | POA: Diagnosis not present

## 2023-12-16 DIAGNOSIS — R7881 Bacteremia: Secondary | ICD-10-CM | POA: Diagnosis not present

## 2023-12-16 DIAGNOSIS — R7989 Other specified abnormal findings of blood chemistry: Secondary | ICD-10-CM

## 2023-12-16 DIAGNOSIS — K8309 Other cholangitis: Secondary | ICD-10-CM

## 2023-12-16 DIAGNOSIS — D72829 Elevated white blood cell count, unspecified: Secondary | ICD-10-CM

## 2023-12-16 LAB — BLOOD CULTURE ID PANEL (REFLEXED) - BCID2

## 2023-12-16 LAB — CBC
HCT: 22.3 % — ABNORMAL LOW (ref 36.0–46.0)
Hemoglobin: 6.7 g/dL — ABNORMAL LOW (ref 12.0–15.0)
MCH: 28.2 pg (ref 26.0–34.0)
MCHC: 30 g/dL (ref 30.0–36.0)
MCV: 93.7 fL (ref 80.0–100.0)
Platelets: 415 10*3/uL — ABNORMAL HIGH (ref 150–400)
RBC: 2.38 MIL/uL — ABNORMAL LOW (ref 3.87–5.11)
RDW: 17.9 % — ABNORMAL HIGH (ref 11.5–15.5)
WBC: 21.3 10*3/uL — ABNORMAL HIGH (ref 4.0–10.5)
nRBC: 0 % (ref 0.0–0.2)

## 2023-12-16 LAB — FERRITIN: Ferritin: 3187 ng/mL — ABNORMAL HIGH (ref 11–307)

## 2023-12-16 LAB — COMPREHENSIVE METABOLIC PANEL WITH GFR
ALT: 61 U/L — ABNORMAL HIGH (ref 0–44)
AST: 184 U/L — ABNORMAL HIGH (ref 15–41)
Albumin: 2.1 g/dL — ABNORMAL LOW (ref 3.5–5.0)
Alkaline Phosphatase: 974 U/L — ABNORMAL HIGH (ref 38–126)
Anion gap: 10 (ref 5–15)
BUN: 53 mg/dL — ABNORMAL HIGH (ref 8–23)
CO2: 27 mmol/L (ref 22–32)
Calcium: 9 mg/dL (ref 8.9–10.3)
Chloride: 102 mmol/L (ref 98–111)
Creatinine, Ser: 4.13 mg/dL — ABNORMAL HIGH (ref 0.44–1.00)
GFR, Estimated: 11 mL/min — ABNORMAL LOW (ref >60)
Glucose, Bld: 110 mg/dL — ABNORMAL HIGH (ref 70–99)
Potassium: 3.6 mmol/L (ref 3.5–5.1)
Sodium: 139 mmol/L (ref 135–145)
Total Bilirubin: 1.4 mg/dL — ABNORMAL HIGH (ref 0.0–1.2)
Total Protein: 6.9 g/dL (ref 6.5–8.1)

## 2023-12-16 LAB — PREPARE RBC (CROSSMATCH)

## 2023-12-16 LAB — CBG MONITORING, ED: Glucose-Capillary: 111 mg/dL — ABNORMAL HIGH (ref 70–99)

## 2023-12-16 LAB — IRON AND TIBC
Iron: 7 ug/dL — ABNORMAL LOW (ref 28–170)
Saturation Ratios: 4 % — ABNORMAL LOW (ref 10.4–31.8)
TIBC: 158 ug/dL — ABNORMAL LOW (ref 250–450)
UIBC: 151 ug/dL

## 2023-12-16 LAB — HEPATITIS B SURFACE ANTIGEN: Hepatitis B Surface Ag: NONREACTIVE

## 2023-12-16 LAB — URINE CULTURE: Culture: NO GROWTH

## 2023-12-16 LAB — MRSA NEXT GEN BY PCR, NASAL: MRSA by PCR Next Gen: NOT DETECTED

## 2023-12-16 MED ORDER — SODIUM CHLORIDE 0.9 % IV SOLN: Freq: Once | INTRAVENOUS | Status: AC

## 2023-12-16 MED ORDER — ALTEPLASE 2 MG IJ SOLR: 2.0000 mg | Freq: Once | INTRAMUSCULAR | Status: DC | PRN

## 2023-12-16 MED ORDER — DAKINS (1/4 STRENGTH) 0.125 % EX SOLN
1.0000 | Freq: Every day | CUTANEOUS | Status: AC
  Administered 2023-12-16 – 2023-12-18 (×2): 1
  Filled 2023-12-16 (×2): qty 473

## 2023-12-16 MED ORDER — GADOBUTROL 1 MMOL/ML IV SOLN
7.0000 mL | Freq: Once | INTRAVENOUS | Status: AC | PRN
  Administered 2023-12-16: 7 mL via INTRAVENOUS

## 2023-12-16 MED ORDER — HEPARIN SODIUM (PORCINE) 1000 UNIT/ML DIALYSIS
1000.0000 [IU] | INTRAMUSCULAR | Status: DC | PRN
  Administered 2023-12-16: 3800 [IU]
  Filled 2023-12-16: qty 1

## 2023-12-16 MED ORDER — CHLORHEXIDINE GLUCONATE CLOTH 2 % EX PADS
6.0000 | MEDICATED_PAD | Freq: Every day | CUTANEOUS | Status: DC
  Administered 2023-12-16 – 2023-12-24 (×8): 6 via TOPICAL
  Filled 2023-12-16: qty 6

## 2023-12-16 MED ORDER — SODIUM CHLORIDE 0.9 % IV SOLN
2.0000 g | INTRAVENOUS | Status: DC
  Administered 2023-12-16 – 2023-12-24 (×8): 2 g via INTRAVENOUS
  Filled 2023-12-16 (×10): qty 20

## 2023-12-16 MED ORDER — METRONIDAZOLE 500 MG/100ML IV SOLN
500.0000 mg | Freq: Two times a day (BID) | INTRAVENOUS | Status: AC
  Administered 2023-12-16 – 2023-12-19 (×5): 500 mg via INTRAVENOUS
  Filled 2023-12-16 (×7): qty 100

## 2023-12-16 NOTE — Consult Note (Signed)
 Sherry Sink, MD [facility]  [address] [place], [place] [address] Phone: [phone] Fax : [phone]  Consultation  Referring Provider:     Dr. Garrett Kallman Primary Care Physician:  Tita Form, MD Primary Gastroenterologist:       [facility]    Reason for Consultation:     Cholangitis  Date of Admission:  12/15/2023 Date of Consultation:  12/16/2023   HPI:   Sherry [name] is a 68 y.o. female who has a history of end-stage renal disease and is on hemodialysis.  The patient has a history of chronic bilateral duct dilation after cholecystectomy which was thought to be physiological.  The patient was now admitted with fevers and altered mental status from a nursing home.  It is reported that her fevers were as high as 102.4 on the day admission.  She was noted to be confused and not answering questions or following commands.  The patient had an MRCP today that showed:  IMPRESSION: 1. Exam detail is diminished due to motion artifact. 2. Within the right lobe of liver there are multifocal areas of mural enhancing T2 hyperintense foci which are favored to represent dilated biliary radicles. The less dilated more central bile ducts within the right lobe have irregular appearance with multiple focal areas of narrowing giving it a string of pearls like appearance. In the acute setting imaging findings are concerning for cholangitis. Differential considerations include multiple peripheral liver abscesses. Underlying malignancy is considered less favored. Follow-up imaging advised to ensure resolution following appropriate therapy. 3. Status post cholecystectomy. Common bile duct dilatation measures up to 2.1 cm. Abrupt focal narrowing of the distal 7 mm of the CBD is favored to represent underlying stricture possibly postinflammatory or secondary to previously passed stone. Debris is noted within the CBD with a few small foci of signal void artifact measuring approximately 3 mm which  may reflect small stones versus signal void artifact. 4. Trace bilateral pleural effusions with overlying subpleural consolidation noted in both lung bases. 5. Bilateral renal cortical atrophy. 6. Small subcutaneous nodule within the right paramidline ventral abdominal wall is favored to represent a small hematoma. Possibly due to injection site  The patient's liver enzymes were noted to be abnormal with the labs showing:  Component     Latest Ref Rng 05/25/2023 05/26/2023 12/15/2023 12/16/2023  AST     15 - 41 U/L 12 (L)   214 (H)  184 (H)   ALT     0 - 44 U/L 9   67 (H)  61 (H)   Alkaline Phosphatase     38 - 126 U/L 65   1,035 (H)  974 (H)   Total Bilirubin     0.0 - 1.2 mg/dL 0.4   0.9  1.4 (H)    The patient's CBC also showed:  Component     Latest Ref Rng 05/26/2023 12/15/2023 12/16/2023  WBC     4.0 - 10.5 K/uL 4.7  18.2 (H)  21.3 (H)   RBC     3.87 - 5.11 MIL/uL 2.81 (L)  2.68 (L)  2.38 (L)   Hemoglobin     12.0 - 15.0 g/dL 8.1 (L)  7.7 (L)  6.7 (L)   HCT     36.0 - 46.0 % 25.3 (L)  24.8 (L)  22.3 (L)    The patient is not able to give much information and answers with one-word answers only to a few questions  Past Medical History:  Diagnosis Date   Alcohol  use disorder, severe, dependence (HCC) 08/27/2017   Anxiety    Body mass index (BMI) 40.0-44.9, adult (HCC) 07/19/2020   Breast cancer (HCC)    right lumpectemy and lymph node    Cervical spondylosis 05/24/2020   Chronic bilateral low back pain with right-sided sciatica 05/24/2020   Chronic diastolic CHF (congestive heart failure) (HCC) 05/09/2019   Cigarette smoker 12/31/2017   ESRD (end stage renal disease) on dialysis (HCC) 08/22/2021   M-W-F   Essential hypertension 07/06/2017   GERD (gastroesophageal reflux disease)    Hepatitis C virus infection cured after antiviral drug therapy 12/17/2012   Telephone Encounter - Nerissa Bannister, RN - 12/28/2016 9:54 AM EDT Patient called for HCV RNA test results. EOT  08-26-16 - not detected. 12 week post treatment 12-11-16 - not detected. Informed patient that she was cured. Patient verbalized understanding.          Hx of bipolar disorder 01/31/2016   Impaired functional mobility, balance, gait, and endurance 01/09/2021   MRSA infection    of breast incision   Neuroleptic-induced tardive dyskinesia 09/06/2017   Abilify   Opioid use disorder, severe, dependence (HCC) 08/27/2017   Restless legs syndrome 02/13/2013   Formatting of this note might be different from the original. IMPRESSION: Possible. Will follow.   Subacute dyskinesia due to drug 02/13/2013   Formatting of this note might be different from the original. STORY: Tardive dyskinesia and mild limb dyskinesia, LE>UE which likely due to prolonged antipsychotic used, abilify. Couldn't tolerate artane, gabapentin , clonazepam and xenazine.`E1o3L`IMPRESSION: Well tolerated depakote  250mg  bid, mood aspect is better as well. Will increase to 500mg  bid. ADR was discussed.  RTC 4-6 weeks.   TIA (transient ischemic attack) 2020   P/w BP >230/120 and neurologic symptoms, presumed TIA    Past Surgical History:  Procedure Laterality Date   A/V FISTULAGRAM Right 01/29/2022   Procedure: A/V Fistulagram;  Surgeon: Young Hensen, MD;  Location: [facility] INVASIVE CV LAB;  Service: Cardiovascular;  Laterality: Right;   AV FISTULA PLACEMENT Right 05/01/2021   Procedure: RIGHT ARTERIOVENOUS (AV) FISTULA CREATION;  Surgeon: Kayla Part, MD;  Location: [facility] OR;  Service: Vascular;  Laterality: Right;  PERIPHERAL NERVE BLOCK   BACK SURGERY  1990   BASCILIC VEIN TRANSPOSITION Right 09/02/2021   Procedure: RIGHT SECOND STAGE BASILIC VEIN TRANSPOSITION;  Surgeon: Kayla Part, MD;  Location: [facility] OR;  Service: Vascular;  Laterality: Right;  PERIPHERAL NERVE BLOCK   BREAST SURGERY Right 2010   "breast cancer survivor" - states partial mastectomy and nodes   COLONOSCOPY     [facility]    ESOPHAGOGASTRODUODENOSCOPY     [facility]   ESOPHAGOGASTRODUODENOSCOPY  04/18/2020   [facility]   LAPAROSCOPIC CHOLECYSTECTOMY  2002   LAPAROSCOPIC INCISIONAL / UMBILICAL / VENTRAL HERNIA REPAIR  04/13/2018   with BARD 15x 20cm mesh (supraumbilical)   LAPAROSCOPIC LYSIS OF ADHESIONS  07/12/2018   Procedure: LAPAROSCOPIC LYSIS OF ADHESIONS;  Surgeon: Lyn Sanders, MD;  Location: [facility] ORS;  Service: Gynecology;;   MULTIPLE TOOTH EXTRACTIONS     MYOMECTOMY     x 2 prior to hysterectomy   PERIPHERAL VASCULAR BALLOON ANGIOPLASTY Right 01/29/2022   Procedure: PERIPHERAL VASCULAR BALLOON ANGIOPLASTY;  Surgeon: Young Hensen, MD;  Location: [facility] INVASIVE CV LAB;  Service: Cardiovascular;  Laterality: Right;  arm fistula   ROBOTIC ASSISTED BILATERAL SALPINGO OOPHERECTOMY Right 07/12/2018   Procedure: XI ROBOTIC ASSISTED RIGHT SALPINGO OOPHORECTOMY;  Surgeon:  Lyn Sanders, MD;  Location: [facility] ORS;  Service: Gynecology;  Laterality: Right;   TOTAL ABDOMINAL HYSTERECTOMY     fibroids    UPPER GASTROINTESTINAL ENDOSCOPY     WISDOM TOOTH EXTRACTION      Prior to Admission medications   Medication Sig Start Date End Date Taking? Authorizing Provider  Acetaminophen  325 MG CAPS Take 1 tablet by mouth every 6 (six) hours as needed. 12/30/21  Yes [provider]  aspirin  81 MG chewable tablet Place 81 mg into feeding tube daily.   Yes [provider]  carvedilol  (COREG ) 12.5 MG tablet Take 25 mg by mouth 2 (two) times daily.   Yes [provider]  cetirizine HCl (ZYRTEC) 1 MG/ML solution Take 10 mg by mouth daily.   Yes [provider]  cloBAZam (ONFI) 2.5 MG/ML solution Place 2 mLs into feeding tube every 12 (twelve) hours. 12/11/23  Yes [provider]  diphenhydrAMINE  (BENADRYL ) 25 mg capsule Place 25 mg into feeding tube every 6 (six) hours as needed.   Yes [provider]  gabapentin  (NEURONTIN ) 100 MG capsule 100 mg 3 (three)  times daily. Via G-tube   Yes [provider]  heparin  5000 UNIT/ML injection Inject 5,000 Units into the skin once.   Yes [provider]  hydrALAZINE  (APRESOLINE ) 50 MG tablet Take 50 mg by mouth 3 (three) times daily. 03/24/21  Yes [provider]  hydrocortisone  cream 1 % Apply 1 Application topically 2 (two) times daily.   Yes [provider]  hydrOXYzine  (ATARAX ) 25 MG tablet Take 25 mg by mouth 3 (three) times daily as needed.   Yes [provider]  lacosamide (VIMPAT) 200 MG TABS tablet Place 200 mg into feeding tube 2 (two) times daily.   Yes [provider]  levETIRAcetam (KEPPRA) 100 MG/ML solution Place 5 mLs into feeding tube 2 (two) times daily.   Yes [provider]  mupirocin ointment (BACTROBAN) 2 % Apply 1 Application topically 2 (two) times daily.   Yes [provider]  omeprazole  (PRILOSEC OTC) 20 MG tablet Take 20 mg by mouth daily. Per G-tube   Yes [provider]  ondansetron  (ZOFRAN ) 4 MG/5ML solution Place 5 mLs into feeding tube daily.   Yes [provider]  oxycodone  (OXY-IR) 5 MG capsule Take 5 mg by mouth every 4 (four) hours as needed for pain.   Yes [provider]  polyethylene glycol (MIRALAX  / GLYCOLAX ) 17 g packet Take 17 g by mouth daily.   Yes [provider]  sertraline (ZOLOFT) 25 MG tablet Take 25 mg by mouth daily.   Yes [provider]  sevelamer carbonate (RENVELA) 800 MG tablet Place 800 mg into feeding tube 3 (three) times daily with meals.   Yes [provider]  albuterol  (VENTOLIN  HFA) 108 (90 Base) MCG/ACT inhaler Inhale 2 puffs into the lungs every 4 (four) hours as needed. 05/24/23   [provider]  diazepam  (VALIUM ) 10 MG tablet Take 0.5 tablets (5 mg total) by mouth as directed. Patient not taking: Reported on 12/15/2023 05/27/23   McLendon, Michael, MD  pantoprazole  (PROTONIX ) 40 MG tablet Take 1 tablet (40 mg total)  by mouth daily. 02/01/22 05/26/23  Ozell Blunt, MD  senna-docusate (SENOKOT-S) 8.6-50 MG tablet Take 1 tablet by mouth at bedtime as needed for mild constipation. 03/02/23   Long, Shereen Dike, MD  traMADol  (ULTRAM ) 50 MG tablet Take 50-100 mg by mouth 3 (three) times daily as needed  for severe pain. Patient not taking: Reported on 12/15/2023    [provider]    Family History  Problem Relation Age of Onset   Heart attack Mother    Breast cancer Mother 63   Dementia Mother    Cancer Father 72       died of bleeding from kidneys   Colon cancer Neg Hx    Esophageal cancer Neg Hx    Stomach cancer Neg Hx    Rectal cancer Neg Hx      Social History   Tobacco Use   Smoking status: Every Day    Current packs/day: 0.50    Types: Cigarettes    Passive exposure: Never   Smokeless tobacco: Never  Vaping Use   Vaping status: Former   Start date: 06/27/2013   Quit date: 06/14/2017   Devices: Apple Cinnamon-0 mg  Substance Use Topics   Alcohol  use: Not Currently    Comment: hx over 30 years   Drug use: Not Currently    Comment: h/o IVD use    Allergies as of 12/15/2023 - Review Complete 12/15/2023  Allergen Reaction Noted   Abilify [aripiprazole] Other (See Comments) 09/06/2017   Remeron  [mirtazapine ] Other (See Comments) 09/06/2017   Trazodone  and nefazodone Other (See Comments) 09/06/2017   Augmentin [amoxicillin -pot clavulanate]  12/15/2023   Flexeril [cyclobenzaprine] Other (See Comments) 04/14/2015   Amoxicillin  Diarrhea 08/12/2017    Review of Systems:    All systems reviewed and negative except where noted in HPI.   Physical Exam:  Vital signs in last 24 hours: Temp:  [99.1 F (37.3 C)-102.9 F (39.4 C)] 99.1 F (37.3 C) (06/05 0953) Pulse Rate:  [77-95] 78 (06/05 1100) Resp:  [12-21] 13 (06/05 1100) BP: (90-150)/(49-72) 109/54 (06/05 1100) SpO2:  [92 %-99 %] 99 % (06/05 1100) Weight:  [66.8 kg] 66.8 kg (06/04 1548) Last BM Date : 12/16/23 General:   In  NAD Head:  Normocephalic and atraumatic. Eyes:   No icterus.   Conjunctiva pink. PERRLA. Ears:  Normal auditory acuity. Neck:  Supple; no masses or thyroidomegaly Lungs: Respirations even and unlabored. Lungs clear to auscultation bilaterally.   No wheezes, crackles, or rhonchi.  Heart:  Regular rate and rhythm;  Without murmur, clicks, rubs or gallops Abdomen:  Soft, nondistended, nontender. Normal bowel sounds. No appreciable masses or hepatomegaly.  No rebound or guarding.  Rectal:  Not performed. Msk:  Symmetrical without gross deformities.  Extremities:  Without edema, cyanosis or clubbing. Neurologic:  Alert and oriented x3;  grossly normal neurologically. Skin:  Intact without significant lesions or rashes. Cervical Nodes:  No significant cervical adenopathy. Psych: Lethargic.  LAB RESULTS: Recent Labs    12/15/23 1819 12/16/23 0532  WBC 18.2* 21.3*  HGB 7.7* 6.7*  HCT 24.8* 22.3*  PLT 414* 415*   BMET Recent Labs    12/15/23 1819 12/16/23 0532  NA 142 139  K 3.6 3.6  CL 103 102  CO2 26 27  GLUCOSE 139* 110*  BUN 45* 53*  CREATININE 3.61* 4.13*  CALCIUM  8.8* 9.0   LFT Recent Labs    12/16/23 0532  PROT 6.9  ALBUMIN 2.1*  AST 184*  ALT 61*  ALKPHOS 974*  BILITOT 1.4*   PT/INR Recent Labs    12/15/23 1820  LABPROT 16.0*  INR 1.3*    STUDIES: MR ABDOMEN MRCP W WO CONTAST Result Date: 12/16/2023 CLINICAL DATA:  Right upper quadrant pain. Evaluate for biliary disease. EXAM: MRI ABDOMEN WITHOUT AND WITH CONTRAST (  INCLUDING MRCP) TECHNIQUE: Multiplanar multisequence MR imaging of the abdomen was performed both before and after the administration of intravenous contrast. Heavily T2-weighted images of the biliary and pancreatic ducts were obtained, and three-dimensional MRCP images were rendered by post processing. CONTRAST:  7mL GADAVIST GADOBUTROL 1 MMOL/ML IV SOLN COMPARISON:  CT chest, abdomen and pelvis from 12/15/2023 FINDINGS: Note, exam detail is  diminished due to motion artifact. Lower chest: Trace bilateral pleural effusions with overlying subpleural consolidation noted in both lung bases. . Hepatobiliary: Within the right lobe of liver there are multifocal areas of mural enhancing T2 hyperintense foci which are favored to represent dilated biliary radicles. The less dilated more central bile ducts within the right lobe have irregular appearance with multiple focal areas of narrowing giving it a string of pearls like appearance. Status post cholecystectomy. The common bile duct is dilated measuring 2.1 cm proximally with be quite narrowing of the distal 7 mm of the CBD, image 8/14. Debris is noted within the CBD with a few small foci of signal void artifact measuring approximately 3 mm which may reflect small stones versus signal void artifact. Pancreas: No mass, inflammatory changes, or other parenchymal abnormality identified. Spleen:  Within normal limits in size and appearance. Adrenals/Urinary Tract: Normal adrenal glands. Bilateral renal cortical atrophy. No hydronephrosis. Multiple bilateral renal cysts are identified. The largest arises off the upper pole of the left kidney measuring 3.7 x 2.9 cm. Stomach/Bowel: Stomach is normal. No dilated loops of bowel identified within the imaged portions of the abdomen. Vascular/Lymphatic: Normal caliber of the abdominal aorta. The upper abdominal vascularity appears patent. No adenopathy. Other:  No free fluid or fluid collections. Musculoskeletal: Within the right paramidline ventral abdominal wall there is a small subcutaneous nodule measuring 1.5 cm. This is T1 and T2 hyperintense without enhancement and is favored to represent a small hematoma. Possibly due to injection site. Subcutaneous edema is noted along both flanks. No enhancing bone abnormality. IMPRESSION: 1. Exam detail is diminished due to motion artifact. 2. Within the right lobe of liver there are multifocal areas of mural enhancing T2  hyperintense foci which are favored to represent dilated biliary radicles. The less dilated more central bile ducts within the right lobe have irregular appearance with multiple focal areas of narrowing giving it a string of pearls like appearance. In the acute setting imaging findings are concerning for cholangitis. Differential considerations include multiple peripheral liver abscesses. Underlying malignancy is considered less favored. Follow-up imaging advised to ensure resolution following appropriate therapy. 3. Status post cholecystectomy. Common bile duct dilatation measures up to 2.1 cm. Abrupt focal narrowing of the distal 7 mm of the CBD is favored to represent underlying stricture possibly postinflammatory or secondary to previously passed stone. Debris is noted within the CBD with a few small foci of signal void artifact measuring approximately 3 mm which may reflect small stones versus signal void artifact. 4. Trace bilateral pleural effusions with overlying subpleural consolidation noted in both lung bases. 5. Bilateral renal cortical atrophy. 6. Small subcutaneous nodule within the right paramidline ventral abdominal wall is favored to represent a small hematoma. Possibly due to injection site. Electronically Signed   By: Kimberley Penman M.D.   On: 12/16/2023 04:45   MR 3D Recon At Scanner Result Date: 12/16/2023 CLINICAL DATA:  Right upper quadrant pain. Evaluate for biliary disease. EXAM: MRI ABDOMEN WITHOUT AND WITH CONTRAST (INCLUDING MRCP) TECHNIQUE: Multiplanar multisequence MR imaging of the abdomen was performed both before and after the administration of  intravenous contrast. Heavily T2-weighted images of the biliary and pancreatic ducts were obtained, and three-dimensional MRCP images were rendered by post processing. CONTRAST:  7mL GADAVIST GADOBUTROL 1 MMOL/ML IV SOLN COMPARISON:  CT chest, abdomen and pelvis from 12/15/2023 FINDINGS: Note, exam detail is diminished due to motion  artifact. Lower chest: Trace bilateral pleural effusions with overlying subpleural consolidation noted in both lung bases. . Hepatobiliary: Within the right lobe of liver there are multifocal areas of mural enhancing T2 hyperintense foci which are favored to represent dilated biliary radicles. The less dilated more central bile ducts within the right lobe have irregular appearance with multiple focal areas of narrowing giving it a string of pearls like appearance. Status post cholecystectomy. The common bile duct is dilated measuring 2.1 cm proximally with be quite narrowing of the distal 7 mm of the CBD, image 8/14. Debris is noted within the CBD with a few small foci of signal void artifact measuring approximately 3 mm which may reflect small stones versus signal void artifact. Pancreas: No mass, inflammatory changes, or other parenchymal abnormality identified. Spleen:  Within normal limits in size and appearance. Adrenals/Urinary Tract: Normal adrenal glands. Bilateral renal cortical atrophy. No hydronephrosis. Multiple bilateral renal cysts are identified. The largest arises off the upper pole of the left kidney measuring 3.7 x 2.9 cm. Stomach/Bowel: Stomach is normal. No dilated loops of bowel identified within the imaged portions of the abdomen. Vascular/Lymphatic: Normal caliber of the abdominal aorta. The upper abdominal vascularity appears patent. No adenopathy. Other:  No free fluid or fluid collections. Musculoskeletal: Within the right paramidline ventral abdominal wall there is a small subcutaneous nodule measuring 1.5 cm. This is T1 and T2 hyperintense without enhancement and is favored to represent a small hematoma. Possibly due to injection site. Subcutaneous edema is noted along both flanks. No enhancing bone abnormality. IMPRESSION: 1. Exam detail is diminished due to motion artifact. 2. Within the right lobe of liver there are multifocal areas of mural enhancing T2 hyperintense foci which are  favored to represent dilated biliary radicles. The less dilated more central bile ducts within the right lobe have irregular appearance with multiple focal areas of narrowing giving it a string of pearls like appearance. In the acute setting imaging findings are concerning for cholangitis. Differential considerations include multiple peripheral liver abscesses. Underlying malignancy is considered less favored. Follow-up imaging advised to ensure resolution following appropriate therapy. 3. Status post cholecystectomy. Common bile duct dilatation measures up to 2.1 cm. Abrupt focal narrowing of the distal 7 mm of the CBD is favored to represent underlying stricture possibly postinflammatory or secondary to previously passed stone. Debris is noted within the CBD with a few small foci of signal void artifact measuring approximately 3 mm which may reflect small stones versus signal void artifact. 4. Trace bilateral pleural effusions with overlying subpleural consolidation noted in both lung bases. 5. Bilateral renal cortical atrophy. 6. Small subcutaneous nodule within the right paramidline ventral abdominal wall is favored to represent a small hematoma. Possibly due to injection site. Electronically Signed   By: Kimberley Penman M.D.   On: 12/16/2023 04:45   CT Head Wo Contrast Result Date: 12/15/2023 CLINICAL DATA:  Mental status change, unknown cause EXAM: CT HEAD WITHOUT CONTRAST TECHNIQUE: Contiguous axial images were obtained from the base of the skull through the vertex without intravenous contrast. RADIATION DOSE REDUCTION: This exam was performed according to the departmental dose-optimization program which includes automated exposure control, adjustment of the mA and/or kV according to  patient size and/or use of iterative reconstruction technique. COMPARISON:  Head CT 09/17/2023 FINDINGS: Brain: No intracranial hemorrhage, mass effect, or midline shift. Stable degree of atrophy. No hydrocephalus. The basilar  cisterns are patent. Moderate to advanced periventricular and deep white matter hypodensity typical of chronic small vessel ischemia, unchanged. Remote lacunar infarct in the left basal ganglia. Remote left cerebellar infarcts. No evidence of territorial infarct or acute ischemia. No extra-axial or intracranial fluid collection. Vascular: Atherosclerosis of skullbase vasculature without hyperdense vessel or abnormal calcification. Skull: No fracture or focal lesion. Sinuses/Orbits: No acute finding. Other: None. IMPRESSION: 1. No acute intracranial abnormality. 2. Stable atrophy, chronic small vessel ischemia, and remote infarcts. Electronically Signed   By: Chadwick Colonel M.D.   On: 12/15/2023 18:58   CT CHEST ABDOMEN PELVIS W CONTRAST Result Date: 12/15/2023 CLINICAL DATA:  Sepsis. EXAM: CT CHEST, ABDOMEN, AND PELVIS WITH CONTRAST TECHNIQUE: Multidetector CT imaging of the chest, abdomen and pelvis was performed following the standard protocol during bolus administration of intravenous contrast. RADIATION DOSE REDUCTION: This exam was performed according to the departmental dose-optimization program which includes automated exposure control, adjustment of the mA and/or kV according to patient size and/or use of iterative reconstruction technique. CONTRAST:  OMNIPAQUE  IOHEXOL  300 MG/ML  SOLN COMPARISON:  CT of the abdomen pelvis dated 09/06/2023. FINDINGS: Evaluation of this exam is limited due to respiratory motion. CT CHEST FINDINGS Cardiovascular: There is no cardiomegaly or pericardial effusion. There is coronary vascular calcification and calcification of the mitral annulus. Moderate atherosclerotic calcification of the thoracic aorta. No aneurysmal dilatation or dissection. The central pulmonary arteries are grossly unremarkable. Right IJ catheter with tip in the right atrium. Mediastinum/Nodes: No obvious hilar or mediastinal adenopathy. The esophagus is grossly unremarkable. No mediastinal fluid  collection. Lungs/Pleura: There is bibasilar subpleural atelectasis. Pneumonia is less likely but not excluded clinical correlation recommended. No pleural effusion or pneumothorax. The central airways are patent. Musculoskeletal: No acute osseous pathology. CT ABDOMEN PELVIS FINDINGS No intra-abdominal free air or free fluid. Hepatobiliary: There is diffuse biliary duct dilatation. Several small hypodense nodular areas in the liver measure up to 15 mm in the right lobe. These are mild evaluated with concerning for intrahepatic abscesses versus less likely metastatic disease. Clinical correlation recommended. The common bile duct measures approximately 2 cm in diameter. No radiopaque calculus noted in the central CBD. Pancreas: Unremarkable. No pancreatic ductal dilatation or surrounding inflammatory changes. Spleen: Normal in size without focal abnormality. Adrenals/Urinary Tract: The right adrenal gland is unremarkable. There is a 2 cm left adrenal nodule: Not evaluated on this CT but present on the prior study. This was previously characterized as adenoma on CT of 11/18/2019. Moderate bilateral renal parenchyma atrophy. Small bilateral renal cysts and hypodense lesions which are too small to characterize. There is no hydronephrosis on either side. The urinary bladder is minimally distended. Stomach/Bowel: Percutaneous gastrostomy with balloon in the proximal body of the stomach. There is moderate to large amount of dense stool throughout the colon and within the rectal vault. There is no bowel obstruction or active inflammation. The appendix is normal. Vascular/Lymphatic: Advanced aortoiliac atherosclerotic disease. The IVC is unremarkable. No portal venous gas. There is no adenopathy. Reproductive: Hysterectomy.  No suspicious adnexal masses. Other: Midline anterior pelvic wall incisional scar. Ventral hernia repair mesh. Multiple subcutaneous nodules in the anterior abdominal wall likely related to subcutaneous  injections. No fluid collection. Musculoskeletal: Osteopenia with degenerative changes of the spine. Lower lumbar posterior fusion. Severe arthritic changes of  the right hip. No acute osseous pathology. IMPRESSION: 1. Diffuse biliary duct dilatation with several small hypodense nodular areas in the liver concerning for intrahepatic abscesses versus less likely metastatic disease. Further evaluation with MRI/MRCP is recommended. 2. Bibasilar subpleural atelectasis. Pneumonia is less likely but not excluded. 3. 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 4. Constipation with possible fecal impaction in the rectal vault. No bowel obstruction. Normal appendix. 5.  Aortic Atherosclerosis (ICD10-I70.0). Electronically Signed   By: Angus Bark M.D.   On: 12/15/2023 18:57   DG Chest Port 1 View Result Date: 12/15/2023 CLINICAL DATA:  Question of sepsis to evaluate for abnormality. Dialysis patient. Altered mental status. Fever. EXAM: PORTABLE CHEST 1 VIEW COMPARISON:  09/21/2023 FINDINGS: Right central venous catheter with tip over the right atrium. Postoperative changes in the cervical spine. Shallow inspiration. Heart size and pulmonary vascularity are normal for technique. Lungs are clear. No pleural effusion or pneumothorax. Mediastinal contours appear intact. Degenerative changes in the spine. IMPRESSION: Shallow inspiration.  No evidence of active pulmonary disease. Electronically Signed   By: Boyce Byes M.D.   On: 12/15/2023 17:55      Impression / Plan:   Assessment: Principal Problem:   Sepsis (HCC) Active Problems:   Hx of bipolar disorder   Essential hypertension   Cigarette smoker   Chronic diastolic CHF (congestive heart failure) (HCC)   Stroke (HCC)   ESRD on dialysis (HCC)   UTI (urinary tract infection)   Liver lesion   Acute metabolic encephalopathy   Sacral wound   Abnormal liver function   Seizure (HCC)   E coli bacteremia   Sherry [name]  is a 68 y.o. y/o female with with elevated white cell count with elevated liver enzymes and a CT scan consistent with possible cholangitis.  There is also suggestion of a distal common bile duct stricture with a dilated CBD at 2.1 cm.  There is also a possibility of filling defects seen.  Plan:  The patient will be set up for a ERCP for tomorrow.  I have spoken to the patient's sister and explained to the sister the risks and benefits of the procedure including pancreatitis, perforation bleeding and death.  They are not many alternatives to cholangitis except for stone extraction if stones are present and stent placement to facilitate drainage of the common bile duct.  The patient is to have been explained the plan and agrees with it.  Thank you for involving me in the care of this patient.      LOS: 1 day   Sherry Sink, MD, MD. Sylvan Evener 12/16/2023, 11:13 AM,  Pager [phone] 7am-5pm  Check AMION for 5pm -7am coverage and on weekends   Note: This dictation was prepared with Dragon dictation along with smaller phrase technology. Any transcriptional errors that result from this process are unintentional.

## 2023-12-16 NOTE — Progress Notes (Signed)
 Pharmacy technician hand delivered clobazam to this RN, I signed and gave the paper back. Upon inspection I was given 10mg /29mL syringe. Shayne Demark RN assisted with wasting medication in proper disposal unit. Total of 2mL were wasted, leaving 70mL=5mg  to administer.

## 2023-12-16 NOTE — Progress Notes (Addendum)
 Progress Note    Sherry [name]  FAO:[id number] DOB: 1955/09/26  DOA: 12/15/2023 PCP: Sherry Form, MD      Brief Narrative:    Medical records reviewed and are as summarized below:  Sherry [name] is a 68 y.o. female with medical history significant for ESRD on HD (on MWF schedule), seizure, chronic biliary duct dilation in the setting of a cholecystectomy which is felt to be physiological and normal per GI consult (this is documented by Dr. Gordon [name] on 02/24/22), chronic hepatitis C, opioid use disorder, stroke, s/p of gastrostomy, hypertension, diastolic CHF, stroke, depression with anxiety, bipolar, anemia,HCV, breast cancer (s/p right mastectomy), polypharmacy.  She was previously hospitalized at [facility] in February 2025 for E. coli pneumonia and bacteremia, cerebellar stroke and tonic-clonic seizures in the setting of severe PRES that required intubation and subsequent tracheostomy and PEG tube placement.  She presented to the hospital because of fever and altered mental status.  Reportedly, she was noted to be confused and she spiked a temperature with fever of 102.4 F on the day of admission.   ED Course: pt was found to have WBC 18.2, lactic acid of 1.4 --> 1.1, potassium 3.6, positive UA (turbid appearance, large amount of leukocyte, many bacteria, WBC > 50, squamous cell 11-20), negative PCR for COVID, flu and RSV, ammonia level<13, troponin 25 --> 22, abnormal liver function (ALP 1035, AST 214, ALT 67, total bilirubin 0.9). Blood pressure 150/72 --> 119/58, heart rate 95, RR 21, oxygen saturation 94% on room air.  Chest x-ray negative.  CT of head negative.  Patient is admitted to PCU as inpatient.  Dr. Zelda [name] of renal is consulted.   CT of abdomen/pelvis/chest: 1. Diffuse biliary duct dilatation with several small hypodense nodular areas in the liver concerning for intrahepatic abscesses versus less likely metastatic disease. Further evaluation with MRI/MRCP is recommended. 2.  Bibasilar subpleural atelectasis. Pneumonia is less likely but not excluded. 3. 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 4. Constipation with possible fecal impaction in the rectal vault. No bowel obstruction. Normal appendix. 5.  Aortic Atherosclerosis (ICD10-I70.0).   MRCP:    IMPRESSION: 1. Exam detail is diminished due to motion artifact. 2. Within the right lobe of liver there are multifocal areas of mural enhancing T2 hyperintense foci which are favored to represent dilated biliary radicles. The less dilated more central bile ducts within the right lobe have irregular appearance with multiple focal areas of narrowing giving it a string of pearls like appearance. In the acute setting imaging findings are concerning for cholangitis. Differential considerations include multiple peripheral liver abscesses. Underlying malignancy is considered less favored. Follow-up imaging advised to ensure resolution following appropriate therapy. 3. Status post cholecystectomy. Common bile duct dilatation measures up to 2.1 cm. Abrupt focal narrowing of the distal 7 mm of the CBD is favored to represent underlying stricture possibly postinflammatory or secondary to previously passed stone. Debris is noted within the CBD with a few small foci of signal void artifact measuring approximately 3 mm which may reflect small stones versus signal void artifact. 4. Trace bilateral pleural effusions with overlying subpleural consolidation noted in both lung bases. 5. Bilateral renal cortical atrophy. 6. Small subcutaneous nodule within the right paramidline ventral abdominal wall is favored to represent a small hematoma. Possibly due to injection site.    She was admitted to the hospital for severe sepsis from E. coli bacteremia, probable acute UTI and suspected acute cholangitis.  Assessment/Plan:   Principal Problem:   Severe sepsis (HCC) Active Problems:    Liver lesion   Abnormal liver function   UTI (urinary tract infection)   Acute metabolic encephalopathy   ESRD on dialysis [facility])   Essential hypertension   Chronic diastolic CHF (congestive heart failure) (HCC)   Stroke (HCC)   Seizure (HCC)   Cigarette smoker   Hx of bipolar disorder   Sacral wound   E coli bacteremia   Cholangitis    Body mass index is 26.94 kg/m.   Severe sepsis, E. coli bacteremia, probable acute UTI, probable acute cholangitis: 4 out of 4 blood cultures positive for E. coli.  Continue IV ceftriaxone.  Follow-up blood culture susceptibility report.  Consulted Dr. Harwood [name], ID specialist, to assist with management.   Chronic biliary dilatation from previous cholecystectomy, suspected acute cholangitis, suspected multiple peripheral liver abscesses on MRCP: Consulted Dr. Ole [name], gastroenterologist, to assist with management. Chronically elevated liver enzymes Chronic hepatitis C infection   Acute metabolic encephalopathy: Likely due to infectious process.  CT head did not show any acute stroke.   Acute on chronic anemia: Hemoglobin dropped from 7.7-6.7.  Transfuse 1 unit of PRBCs.  Discussed risks, benefits of blood transfusion with Sherry [name] (her sister) and Sherry [name] (her niece).  They agreeable with blood transfusion.   ESRD: Nephrologist has been consulted for hemodialysis Chronic diastolic CHF: Compensated   Seizure disorder: Continue clobazam, Vimpat and Keppra.   Sacral decubitus ulcer (stage III versus stage IV): Present on admission.  According to [facility], patient's niece, wound was contaminated with fecal material about a week prior to admission.   History of stroke, dysphagia with gastrostomy tube in place  Diet Order             Diet NPO time specified Except for: Sips with Meds, Ice Chips  Diet effective now                            Consultants: ID  specialist Nephrologist Gastroenterologist  Procedures: None    Medications:    aspirin   81 mg Per Tube Daily   Chlorhexidine  Gluconate Cloth  6 each Topical Q0600   cloBAZam  5 mg Per Tube Q12H   heparin   5,000 Units Subcutaneous Q8H   lacosamide  200 mg Per Tube BID   levETIRAcetam  500 mg Per Tube BID   mupirocin ointment  1 Application Topical BID   nicotine   21 mg Transdermal Daily   omeprazole   20 mg Oral Daily   sertraline  25 mg Oral Daily   sevelamer carbonate  800 mg Per Tube TID WC   sodium hypochlorite  1 Application Irrigation Daily   Continuous Infusions:  sodium chloride  50 mL/hr at 12/16/23 0759   sodium chloride      cefTRIAXone (ROCEPHIN)  IV Stopped (12/16/23 0753)     Anti-infectives (From admission, onward)    Start     Dose/Rate Route Frequency Ordered Stop   12/16/23 2200  ceFEPIme (MAXIPIME) 1 g in sodium chloride  0.9 % 100 mL IVPB  Status:  Discontinued        1 g 200 mL/hr over 30 Minutes Intravenous Every 24 hours 12/15/23 2037 12/16/23 0549   12/16/23 1200  vancomycin  (VANCOREADY) IVPB 750 mg/150 mL  Status:  Discontinued        750 mg 150 mL/hr over 60 Minutes Intravenous Every T-Th-Sa (Hemodialysis) 12/15/23 2312 12/16/23 0549   12/16/23 0600  metroNIDAZOLE (  FLAGYL) IVPB 500 mg  Status:  Discontinued        500 mg 100 mL/hr over 60 Minutes Intravenous Every 12 hours 12/15/23 2035 12/16/23 0549   12/16/23 0600  cefTRIAXone (ROCEPHIN) 2 g in sodium chloride  0.9 % 100 mL IVPB        2 g 200 mL/hr over 30 Minutes Intravenous Every 24 hours 12/16/23 0553     12/15/23 2245  vancomycin  (VANCOREADY) IVPB 500 mg/100 mL        500 mg 100 mL/hr over 60 Minutes Intravenous  Once 12/15/23 2236 12/16/23 0532   12/15/23 1600  ceFEPIme (MAXIPIME) 2 g in sodium chloride  0.9 % 100 mL IVPB        2 g 200 mL/hr over 30 Minutes Intravenous  Once 12/15/23 1548 12/15/23 1819   12/15/23 1600  metroNIDAZOLE (FLAGYL) IVPB 500 mg        500 mg 100 mL/hr over 60  Minutes Intravenous  Once 12/15/23 1548 12/15/23 1955   12/15/23 1600  vancomycin  (VANCOCIN ) IVPB 1000 mg/200 mL premix        1,000 mg 200 mL/hr over 60 Minutes Intravenous  Once 12/15/23 1548 12/15/23 2128              Family Communication/Anticipated D/C date and plan/Code Status   DVT prophylaxis: heparin  injection 5,000 Units Start: 12/15/23 2200     Code Status: Full Code  Family Communication: Plan discussed with Sherry [name] (her sister) and Eboni (her niece) over the phone Disposition Plan: Plan to discharge to SNF   Status is: Inpatient Remains inpatient appropriate because: Sepsis, E. coli bacteremia       Subjective:   Interval events noted.  She is lethargic and unable to provide any history.  Objective:    Vitals:   12/16/23 1230 12/16/23 1300 12/16/23 1330 12/16/23 1339  BP: (!) 111/57 110/61 (!) 96/53 (!) 103/55  Pulse: (P) 83 80 89 82  Resp: (P) 15 20 (!) 29 (!) 30  Temp:    98.5 F (36.9 C)  TempSrc:    Axillary  SpO2: (P) 100% 100% 94% 100%  Weight:      Height:       No data found.   Intake/Output Summary (Last 24 hours) at 12/16/2023 1412 Last data filed at 12/16/2023 1339 Gross per 24 hour  Intake --  Output 0 ml  Net 0 ml   Filed Weights   12/15/23 1548  Weight: 66.8 kg    Exam:  GEN: NAD SKIN: Warm and dry. Perm cath on right upper chest EYES: Pale but anicteric ENT: MMM CV: RRR PULM: CTA B ABD: soft, ND, +BS, G-tube JYN:[id number], PERRLA EXT: No edema         Data Reviewed:   I have personally reviewed following labs and imaging studies:  Labs: Labs show the following:   Basic Metabolic Panel: Recent Labs  Lab 12/15/23 1819 12/16/23 0532  NA 142 139  K 3.6 3.6  CL 103 102  CO2 26 27  GLUCOSE 139* 110*  BUN 45* 53*  CREATININE 3.61* 4.13*  CALCIUM  8.8* 9.0   GFR Estimated Creatinine Clearance: 11.7 mL/min (A) (by C-G formula based on SCr of 4.13 mg/dL (H)). Liver Function Tests: Recent Labs   Lab 12/15/23 1819 12/16/23 0532  AST 214* 184*  ALT 67* 61*  ALKPHOS 1,035* 974*  BILITOT 0.9 1.4*  PROT 6.9 6.9  ALBUMIN 2.1* 2.1*   No results for input(s): "LIPASE", "AMYLASE" in the last  168 hours. Recent Labs  Lab 12/15/23 1819  AMMONIA <13   Coagulation profile Recent Labs  Lab 12/15/23 1820  INR 1.3*    CBC: Recent Labs  Lab 12/15/23 1819 12/16/23 0532  WBC 18.2* 21.3*  NEUTROABS 15.8*  --   HGB 7.7* 6.7*  HCT 24.8* 22.3*  MCV 92.5 93.7  PLT 414* 415*   Cardiac Enzymes: Recent Labs  Lab 12/15/23 1819  CKTOTAL 11*   BNP (last 3 results) No results for input(s): "PROBNP" in the last 8760 hours. CBG: Recent Labs  Lab 12/16/23 0917  GLUCAP 111*   D-Dimer: No results for input(s): "DDIMER" in the last 72 hours. Hgb A1c: No results for input(s): "HGBA1C" in the last 72 hours. Lipid Profile: No results for input(s): "CHOL", "HDL", "LDLCALC", "TRIG", "CHOLHDL", "LDLDIRECT" in the last 72 hours. Thyroid function studies: No results for input(s): "TSH", "T4TOTAL", "T3FREE", "THYROIDAB" in the last 72 hours.  Invalid input(s): "FREET3" Anemia work up: Recent Labs    12/16/23 0532  FERRITIN 3,187*  TIBC 158*  IRON 7*   Sepsis Labs: Recent Labs  Lab 12/15/23 1630 12/15/23 1819 12/16/23 0532  WBC  --  18.2* 21.3*  LATICACIDVEN 1.4 1.1  --     Microbiology Recent Results (from the past 240 hours)  Resp panel by RT-PCR (RSV, Flu A&B, Covid) Anterior Nasal Swab     Status: None   Collection Time: 12/15/23  4:11 PM   Specimen: Anterior Nasal Swab  Result Value Ref Range Status   SARS Coronavirus 2 by RT PCR NEGATIVE NEGATIVE Final    Comment: (NOTE) SARS-CoV-2 target nucleic acids are NOT DETECTED.  The SARS-CoV-2 RNA is generally detectable in upper respiratory specimens during the acute phase of infection. The lowest concentration of SARS-CoV-2 viral copies this assay can detect is 138 copies/mL. A negative result does not preclude  SARS-Cov-2 infection and should not be used as the sole basis for treatment or other patient management decisions. A negative result may occur with  improper specimen collection/handling, submission of specimen other than nasopharyngeal swab, presence of viral mutation(s) within the areas targeted by this assay, and inadequate number of viral copies(<138 copies/mL). A negative result must be combined with clinical observations, patient history, and epidemiological information. The expected result is Negative.  Fact Sheet for Patients:  BloggerCourse.com  Fact Sheet for Healthcare Providers:  SeriousBroker.it  This test is no t yet approved or cleared by the [place]  FDA and  has been authorized for detection and/or diagnosis of SARS-CoV-2 by FDA under an Emergency Use Authorization (EUA). This EUA will remain  in effect (meaning this test can be used) for the duration of the COVID-19 declaration under Section 564(b)(1) of the Act, 21 U.S.C.section 360bbb-3(b)(1), unless the authorization is terminated  or revoked sooner.       Influenza A by PCR NEGATIVE NEGATIVE Final   Influenza B by PCR NEGATIVE NEGATIVE Final    Comment: (NOTE) The Xpert Xpress SARS-CoV-2/FLU/RSV plus assay is intended as an aid in the diagnosis of influenza from Nasopharyngeal swab specimens and should not be used as a sole basis for treatment. Nasal washings and aspirates are unacceptable for Xpert Xpress SARS-CoV-2/FLU/RSV testing.  Fact Sheet for Patients: BloggerCourse.com  Fact Sheet for Healthcare Providers: SeriousBroker.it  This test is not yet approved or cleared by the [place]  FDA and has been authorized for detection and/or diagnosis of SARS-CoV-2 by FDA under an Emergency Use Authorization (EUA). This EUA will remain in effect (meaning  this test can be used) for the duration of  the COVID-19 declaration under Section 564(b)(1) of the Act, 21 U.S.C. section 360bbb-3(b)(1), unless the authorization is terminated or revoked.     Resp Syncytial Virus by PCR NEGATIVE NEGATIVE Final    Comment: (NOTE) Fact Sheet for Patients: BloggerCourse.com  Fact Sheet for Healthcare Providers: SeriousBroker.it  This test is not yet approved or cleared by the [place]  FDA and has been authorized for detection and/or diagnosis of SARS-CoV-2 by FDA under an Emergency Use Authorization (EUA). This EUA will remain in effect (meaning this test can be used) for the duration of the COVID-19 declaration under Section 564(b)(1) of the Act, 21 U.S.C. section 360bbb-3(b)(1), unless the authorization is terminated or revoked.  Performed at [facility], [address], [place], [place] [address]   Blood Culture (routine x 2)     Status: None (Preliminary result)   Collection Time: 12/15/23  4:12 PM   Specimen: BLOOD  Result Value Ref Range Status   Specimen Description   Final    BLOOD RIGHT ANTECUBITAL Performed at [facility], [address] [place], [place] [address]    Special Requests   Final    BOTTLES DRAWN AEROBIC AND ANAEROBIC Blood Culture results may not be optimal due to an inadequate volume of blood received in culture bottles Performed at [facility], [address] [place], [place] [address]    Culture  Setup Time   Final    GRAM NEGATIVE RODS IN BOTH AEROBIC AND ANAEROBIC BOTTLES Organism ID to follow CRITICAL RESULT CALLED TO, READ BACK BY AND VERIFIED WITHLatina Pol AT 2130 12/16/23 JG Performed at [facility], [address] [place], [place] [address]    Culture GRAM NEGATIVE RODS  Final   Report Status PENDING  Incomplete  Aerobic/Anaerobic Culture w Gram Stain (surgical/deep wound)     Status: None (Preliminary result)   Collection Time: 12/15/23  4:12  PM   Specimen: Wound  Result Value Ref Range Status   Specimen Description   Final    WOUND Performed at [facility], [address] [place], [place] [address]    Special Requests   Final    SACRAL Performed at [facility], [address] [place], [place] [address]    Gram Stain NO WBC SEEN RARE GRAM NEGATIVE RODS   Final   Culture   Final    TOO YOUNG TO READ Performed at [facility], [address] [place], [place] [address]    Report Status PENDING  Incomplete  Blood Culture ID Panel (Reflexed)     Status: Abnormal   Collection Time: 12/15/23  4:12 PM  Result Value Ref Range Status   Enterococcus faecalis NOT DETECTED NOT DETECTED Final   Enterococcus Faecium NOT DETECTED NOT DETECTED Final   Listeria monocytogenes NOT DETECTED NOT DETECTED Final   Staphylococcus species NOT DETECTED NOT DETECTED Final   Staphylococcus aureus (BCID) NOT DETECTED NOT DETECTED Final   Staphylococcus epidermidis NOT DETECTED NOT DETECTED Final   Staphylococcus lugdunensis NOT DETECTED NOT DETECTED Final   Streptococcus species NOT DETECTED NOT DETECTED Final   Streptococcus agalactiae NOT DETECTED NOT DETECTED Final   Streptococcus pneumoniae NOT DETECTED NOT DETECTED Final   Streptococcus pyogenes NOT DETECTED NOT DETECTED Final   A.calcoaceticus-baumannii NOT DETECTED NOT DETECTED Final   Bacteroides fragilis NOT DETECTED NOT DETECTED Final   Enterobacterales DETECTED (A) NOT DETECTED Final  Comment: Enterobacterales represent a large order of gram negative bacteria, not a single organism. CRITICAL RESULT CALLED TO, READ BACK BY AND VERIFIED WITH:  JASON ROBINS AT 8469 12/16/23 JG    Enterobacter cloacae complex NOT DETECTED NOT DETECTED Final   Escherichia coli DETECTED (A) NOT DETECTED Final    Comment: CRITICAL RESULT CALLED TO, READ BACK BY AND VERIFIED WITH:  JASON ROBINS AT 0529 12/16/23 JG    Klebsiella aerogenes NOT DETECTED NOT DETECTED Final    Klebsiella oxytoca NOT DETECTED NOT DETECTED Final   Klebsiella pneumoniae NOT DETECTED NOT DETECTED Final   Proteus species NOT DETECTED NOT DETECTED Final   Salmonella species NOT DETECTED NOT DETECTED Final   Serratia marcescens NOT DETECTED NOT DETECTED Final   Haemophilus influenzae NOT DETECTED NOT DETECTED Final   Neisseria meningitidis NOT DETECTED NOT DETECTED Final   Pseudomonas aeruginosa NOT DETECTED NOT DETECTED Final   Stenotrophomonas maltophilia NOT DETECTED NOT DETECTED Final   Candida albicans NOT DETECTED NOT DETECTED Final   Candida auris NOT DETECTED NOT DETECTED Final   Candida glabrata NOT DETECTED NOT DETECTED Final   Candida krusei NOT DETECTED NOT DETECTED Final   Candida parapsilosis NOT DETECTED NOT DETECTED Final   Candida tropicalis NOT DETECTED NOT DETECTED Final   Cryptococcus neoformans/gattii NOT DETECTED NOT DETECTED Final   CTX-M ESBL NOT DETECTED NOT DETECTED Final   Carbapenem resistance IMP NOT DETECTED NOT DETECTED Final   Carbapenem resistance KPC NOT DETECTED NOT DETECTED Final   Carbapenem resistance NDM NOT DETECTED NOT DETECTED Final   Carbapenem resist OXA 48 LIKE NOT DETECTED NOT DETECTED Final   Carbapenem resistance VIM NOT DETECTED NOT DETECTED Final    Comment: Performed at [facility], [address] [place], [place] [address]  Blood Culture (routine x 2)     Status: None (Preliminary result)   Collection Time: 12/15/23  4:30 PM   Specimen: BLOOD  Result Value Ref Range Status   Specimen Description   Final    BLOOD RIGHT ANTECUBITAL Performed at [facility], [address] [place], [place] [address]    Special Requests   Final    BOTTLES DRAWN AEROBIC AND ANAEROBIC Blood Culture results may not be optimal due to an inadequate volume of blood received in culture bottles Performed at [facility], [address] [place], [place] [address]    Culture  Setup Time   Final    GRAM NEGATIVE RODS IN  BOTH AEROBIC AND ANAEROBIC BOTTLES CRITICAL VALUE NOTED.  VALUE IS CONSISTENT WITH PREVIOUSLY REPORTED AND CALLED VALUE. Performed at [facility], [address] [place], [place] [address]    Culture GRAM NEGATIVE RODS  Final   Report Status PENDING  Incomplete    Procedures and diagnostic studies:  MR ABDOMEN MRCP W WO CONTAST Result Date: 12/16/2023 CLINICAL DATA:  Right upper quadrant pain. Evaluate for biliary disease. EXAM: MRI ABDOMEN WITHOUT AND WITH CONTRAST (INCLUDING MRCP) TECHNIQUE: Multiplanar multisequence MR imaging of the abdomen was performed both before and after the administration of intravenous contrast. Heavily T2-weighted images of the biliary and pancreatic ducts were obtained, and three-dimensional MRCP images were rendered by post processing. CONTRAST:  7mL GADAVIST GADOBUTROL 1 MMOL/ML IV SOLN COMPARISON:  CT chest, abdomen and pelvis from 12/15/2023 FINDINGS: Note, exam detail is diminished due to motion artifact. Lower chest: Trace bilateral pleural effusions with overlying subpleural consolidation noted in both lung bases. . Hepatobiliary: Within the right lobe of liver there are  multifocal areas of mural enhancing T2 hyperintense foci which are favored to represent dilated biliary radicles. The less dilated more central bile ducts within the right lobe have irregular appearance with multiple focal areas of narrowing giving it a string of pearls like appearance. Status post cholecystectomy. The common bile duct is dilated measuring 2.1 cm proximally with be quite narrowing of the distal 7 mm of the CBD, image 8/14. Debris is noted within the CBD with a few small foci of signal void artifact measuring approximately 3 mm which may reflect small stones versus signal void artifact. Pancreas: No mass, inflammatory changes, or other parenchymal abnormality identified. Spleen:  Within normal limits in size and appearance. Adrenals/Urinary Tract: Normal adrenal glands.  Bilateral renal cortical atrophy. No hydronephrosis. Multiple bilateral renal cysts are identified. The largest arises off the upper pole of the left kidney measuring 3.7 x 2.9 cm. Stomach/Bowel: Stomach is normal. No dilated loops of bowel identified within the imaged portions of the abdomen. Vascular/Lymphatic: Normal caliber of the abdominal aorta. The upper abdominal vascularity appears patent. No adenopathy. Other:  No free fluid or fluid collections. Musculoskeletal: Within the right paramidline ventral abdominal wall there is a small subcutaneous nodule measuring 1.5 cm. This is T1 and T2 hyperintense without enhancement and is favored to represent a small hematoma. Possibly due to injection site. Subcutaneous edema is noted along both flanks. No enhancing bone abnormality. IMPRESSION: 1. Exam detail is diminished due to motion artifact. 2. Within the right lobe of liver there are multifocal areas of mural enhancing T2 hyperintense foci which are favored to represent dilated biliary radicles. The less dilated more central bile ducts within the right lobe have irregular appearance with multiple focal areas of narrowing giving it a string of pearls like appearance. In the acute setting imaging findings are concerning for cholangitis. Differential considerations include multiple peripheral liver abscesses. Underlying malignancy is considered less favored. Follow-up imaging advised to ensure resolution following appropriate therapy. 3. Status post cholecystectomy. Common bile duct dilatation measures up to 2.1 cm. Abrupt focal narrowing of the distal 7 mm of the CBD is favored to represent underlying stricture possibly postinflammatory or secondary to previously passed stone. Debris is noted within the CBD with a few small foci of signal void artifact measuring approximately 3 mm which may reflect small stones versus signal void artifact. 4. Trace bilateral pleural effusions with overlying subpleural  consolidation noted in both lung bases. 5. Bilateral renal cortical atrophy. 6. Small subcutaneous nodule within the right paramidline ventral abdominal wall is favored to represent a small hematoma. Possibly due to injection site. Electronically Signed   By: Kimberley Penman M.D.   On: 12/16/2023 04:45   MR 3D Recon At Scanner Result Date: 12/16/2023 CLINICAL DATA:  Right upper quadrant pain. Evaluate for biliary disease. EXAM: MRI ABDOMEN WITHOUT AND WITH CONTRAST (INCLUDING MRCP) TECHNIQUE: Multiplanar multisequence MR imaging of the abdomen was performed both before and after the administration of intravenous contrast. Heavily T2-weighted images of the biliary and pancreatic ducts were obtained, and three-dimensional MRCP images were rendered by post processing. CONTRAST:  7mL GADAVIST GADOBUTROL 1 MMOL/ML IV SOLN COMPARISON:  CT chest, abdomen and pelvis from 12/15/2023 FINDINGS: Note, exam detail is diminished due to motion artifact. Lower chest: Trace bilateral pleural effusions with overlying subpleural consolidation noted in both lung bases. . Hepatobiliary: Within the right lobe of liver there are multifocal areas of mural enhancing T2 hyperintense foci which are favored to represent dilated biliary radicles. The less dilated  more central bile ducts within the right lobe have irregular appearance with multiple focal areas of narrowing giving it a string of pearls like appearance. Status post cholecystectomy. The common bile duct is dilated measuring 2.1 cm proximally with be quite narrowing of the distal 7 mm of the CBD, image 8/14. Debris is noted within the CBD with a few small foci of signal void artifact measuring approximately 3 mm which may reflect small stones versus signal void artifact. Pancreas: No mass, inflammatory changes, or other parenchymal abnormality identified. Spleen:  Within normal limits in size and appearance. Adrenals/Urinary Tract: Normal adrenal glands. Bilateral renal cortical  atrophy. No hydronephrosis. Multiple bilateral renal cysts are identified. The largest arises off the upper pole of the left kidney measuring 3.7 x 2.9 cm. Stomach/Bowel: Stomach is normal. No dilated loops of bowel identified within the imaged portions of the abdomen. Vascular/Lymphatic: Normal caliber of the abdominal aorta. The upper abdominal vascularity appears patent. No adenopathy. Other:  No free fluid or fluid collections. Musculoskeletal: Within the right paramidline ventral abdominal wall there is a small subcutaneous nodule measuring 1.5 cm. This is T1 and T2 hyperintense without enhancement and is favored to represent a small hematoma. Possibly due to injection site. Subcutaneous edema is noted along both flanks. No enhancing bone abnormality. IMPRESSION: 1. Exam detail is diminished due to motion artifact. 2. Within the right lobe of liver there are multifocal areas of mural enhancing T2 hyperintense foci which are favored to represent dilated biliary radicles. The less dilated more central bile ducts within the right lobe have irregular appearance with multiple focal areas of narrowing giving it a string of pearls like appearance. In the acute setting imaging findings are concerning for cholangitis. Differential considerations include multiple peripheral liver abscesses. Underlying malignancy is considered less favored. Follow-up imaging advised to ensure resolution following appropriate therapy. 3. Status post cholecystectomy. Common bile duct dilatation measures up to 2.1 cm. Abrupt focal narrowing of the distal 7 mm of the CBD is favored to represent underlying stricture possibly postinflammatory or secondary to previously passed stone. Debris is noted within the CBD with a few small foci of signal void artifact measuring approximately 3 mm which may reflect small stones versus signal void artifact. 4. Trace bilateral pleural effusions with overlying subpleural consolidation noted in both lung  bases. 5. Bilateral renal cortical atrophy. 6. Small subcutaneous nodule within the right paramidline ventral abdominal wall is favored to represent a small hematoma. Possibly due to injection site. Electronically Signed   By: Kimberley Penman M.D.   On: 12/16/2023 04:45   CT Head Wo Contrast Result Date: 12/15/2023 CLINICAL DATA:  Mental status change, unknown cause EXAM: CT HEAD WITHOUT CONTRAST TECHNIQUE: Contiguous axial images were obtained from the base of the skull through the vertex without intravenous contrast. RADIATION DOSE REDUCTION: This exam was performed according to the departmental dose-optimization program which includes automated exposure control, adjustment of the mA and/or kV according to patient size and/or use of iterative reconstruction technique. COMPARISON:  Head CT 09/17/2023 FINDINGS: Brain: No intracranial hemorrhage, mass effect, or midline shift. Stable degree of atrophy. No hydrocephalus. The basilar cisterns are patent. Moderate to advanced periventricular and deep white matter hypodensity typical of chronic small vessel ischemia, unchanged. Remote lacunar infarct in the left basal ganglia. Remote left cerebellar infarcts. No evidence of territorial infarct or acute ischemia. No extra-axial or intracranial fluid collection. Vascular: Atherosclerosis of skullbase vasculature without hyperdense vessel or abnormal calcification. Skull: No fracture or focal lesion. Sinuses/Orbits:  No acute finding. Other: None. IMPRESSION: 1. No acute intracranial abnormality. 2. Stable atrophy, chronic small vessel ischemia, and remote infarcts. Electronically Signed   By: Chadwick Colonel M.D.   On: 12/15/2023 18:58   CT CHEST ABDOMEN PELVIS W CONTRAST Result Date: 12/15/2023 CLINICAL DATA:  Sepsis. EXAM: CT CHEST, ABDOMEN, AND PELVIS WITH CONTRAST TECHNIQUE: Multidetector CT imaging of the chest, abdomen and pelvis was performed following the standard protocol during bolus administration of  intravenous contrast. RADIATION DOSE REDUCTION: This exam was performed according to the departmental dose-optimization program which includes automated exposure control, adjustment of the mA and/or kV according to patient size and/or use of iterative reconstruction technique. CONTRAST:  OMNIPAQUE  IOHEXOL  300 MG/ML  SOLN COMPARISON:  CT of the abdomen pelvis dated 09/06/2023. FINDINGS: Evaluation of this exam is limited due to respiratory motion. CT CHEST FINDINGS Cardiovascular: There is no cardiomegaly or pericardial effusion. There is coronary vascular calcification and calcification of the mitral annulus. Moderate atherosclerotic calcification of the thoracic aorta. No aneurysmal dilatation or dissection. The central pulmonary arteries are grossly unremarkable. Right IJ catheter with tip in the right atrium. Mediastinum/Nodes: No obvious hilar or mediastinal adenopathy. The esophagus is grossly unremarkable. No mediastinal fluid collection. Lungs/Pleura: There is bibasilar subpleural atelectasis. Pneumonia is less likely but not excluded clinical correlation recommended. No pleural effusion or pneumothorax. The central airways are patent. Musculoskeletal: No acute osseous pathology. CT ABDOMEN PELVIS FINDINGS No intra-abdominal free air or free fluid. Hepatobiliary: There is diffuse biliary duct dilatation. Several small hypodense nodular areas in the liver measure up to 15 mm in the right lobe. These are mild evaluated with concerning for intrahepatic abscesses versus less likely metastatic disease. Clinical correlation recommended. The common bile duct measures approximately 2 cm in diameter. No radiopaque calculus noted in the central CBD. Pancreas: Unremarkable. No pancreatic ductal dilatation or surrounding inflammatory changes. Spleen: Normal in size without focal abnormality. Adrenals/Urinary Tract: The right adrenal gland is unremarkable. There is a 2 cm left adrenal nodule: Not evaluated on this  CT but present on the prior study. This was previously characterized as adenoma on CT of 11/18/2019. Moderate bilateral renal parenchyma atrophy. Small bilateral renal cysts and hypodense lesions which are too small to characterize. There is no hydronephrosis on either side. The urinary bladder is minimally distended. Stomach/Bowel: Percutaneous gastrostomy with balloon in the proximal body of the stomach. There is moderate to large amount of dense stool throughout the colon and within the rectal vault. There is no bowel obstruction or active inflammation. The appendix is normal. Vascular/Lymphatic: Advanced aortoiliac atherosclerotic disease. The IVC is unremarkable. No portal venous gas. There is no adenopathy. Reproductive: Hysterectomy.  No suspicious adnexal masses. Other: Midline anterior pelvic wall incisional scar. Ventral hernia repair mesh. Multiple subcutaneous nodules in the anterior abdominal wall likely related to subcutaneous injections. No fluid collection. Musculoskeletal: Osteopenia with degenerative changes of the spine. Lower lumbar posterior fusion. Severe arthritic changes of the right hip. No acute osseous pathology. IMPRESSION: 1. Diffuse biliary duct dilatation with several small hypodense nodular areas in the liver concerning for intrahepatic abscesses versus less likely metastatic disease. Further evaluation with MRI/MRCP is recommended. 2. Bibasilar subpleural atelectasis. Pneumonia is less likely but not excluded. 3. 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 4. Constipation with possible fecal impaction in the rectal vault. No bowel obstruction. Normal appendix. 5.  Aortic Atherosclerosis (ICD10-I70.0). Electronically Signed   By: Angus Bark M.D.   On: 12/15/2023 18:57  DG Chest Port 1 View Result Date: 12/15/2023 CLINICAL DATA:  Question of sepsis to evaluate for abnormality. Dialysis patient. Altered mental status. Fever. EXAM: PORTABLE CHEST 1  VIEW COMPARISON:  09/21/2023 FINDINGS: Right central venous catheter with tip over the right atrium. Postoperative changes in the cervical spine. Shallow inspiration. Heart size and pulmonary vascularity are normal for technique. Lungs are clear. No pleural effusion or pneumothorax. Mediastinal contours appear intact. Degenerative changes in the spine. IMPRESSION: Shallow inspiration.  No evidence of active pulmonary disease. Electronically Signed   By: Boyce Byes M.D.   On: 12/15/2023 17:55               LOS: 1 day   Gabrien Mentink  [facility]   Pager on [email or web address]. If 7PM-7AM, please contact night-coverage at [email or web address]     12/16/2023, 2:12 PM

## 2023-12-16 NOTE — Consult Note (Addendum)
 [facility]  CONSULT NOTE    Date: 12/16/2023                  Patient Name:  Sherry [name]  MRN: [id number]  DOB: 02/22/1956  Age / Sex: 68 y.o., female         PCP: Tita Form, MD                 Service Requesting Consult: [facility]                 Reason for Consult: End stage renal disease on hemodialysis            History of Present Illness: Ms. Nyelle Wolfson is a 68 y.o.  female with past medical conditions including seizures, chronic biliary duct dilation, CHF, hypertension, stroke, diastolic heart failure, depression anxiety, bipolar, anemia, breast cancer status post right mastectomy, polypharmacy, end-stage renal disease on hemodialysis, who was admitted to [facility] on 12/15/2023 for Elevated LFTs [R79.89] Liver lesion [K76.9] Dilation of biliary tract [K83.8] Sepsis (HCC) [A41.9] Urinary tract infection without hematuria, site unspecified [N39.0] Altered mental status, unspecified altered mental status type [R41.82] Sepsis, due to unspecified organism, unspecified whether acute organ dysfunction present [facility]) [A41.9]  Patient is an established dialysis patient who receives outpatient treatments at [facility] on a TTS schedule, due to rehab placement.Patient presented to the emergency department with altered mental status, it is unknown how long patient has been experiencing this symptom.  She is seen and evaluated today during dialysis.   HEMODIALYSIS FLOWSHEET:  Blood Flow Rate (mL/min): 400 mL/min Arterial Pressure (mmHg): -182.41 mmHg Venous Pressure (mmHg): 189.69 mmHg TMP (mmHg): 3.84 mmHg Ultrafiltration Rate (mL/min): 257 mL/min Dialysate Flow Rate (mL/min): 299 ml/min Dialysis Fluid Bolus: Normal Saline  Completely obtunded on arrival to dialysis suite.  Little response to painful stimuli. Is able to open eyes an hour into treatment.  Labs on ED arrival show albumin 2.1, AST 214 ALT 67, elevated white count 18.2 with hemoglobin  7.7.Respiratory panel negative for influenza, COVID-19, and RSV.  UA appears turbid with leukocytes, mild proteinuria and bacteria.  UDS positive for benzodiazepine.  1 blood culture positive for E. coli and Enterobacterales.  Urine culture pending.  Chest x-ray shows shallow aspiration.   Medications: Outpatient medications: Medications Prior to Admission  Medication Sig Dispense Refill Last Dose/Taking   Acetaminophen  325 MG CAPS Take 1 tablet by mouth every 6 (six) hours as needed.   12/14/2023 Morning   aspirin  81 MG chewable tablet Place 81 mg into feeding tube daily.   12/15/2023 Morning   carvedilol  (COREG ) 12.5 MG tablet Take 25 mg by mouth 2 (two) times daily.   12/14/2023 Evening   cetirizine HCl (ZYRTEC) 1 MG/ML solution Take 10 mg by mouth daily.   12/15/2023 Morning   cloBAZam (ONFI) 2.5 MG/ML solution Place 2 mLs into feeding tube every 12 (twelve) hours.   12/15/2023 Morning   diphenhydrAMINE  (BENADRYL ) 25 mg capsule Place 25 mg into feeding tube every 6 (six) hours as needed.   12/15/2023 Morning   gabapentin  (NEURONTIN ) 100 MG capsule 100 mg 3 (three) times daily. Via G-tube   12/14/2023 Evening   heparin  5000 UNIT/ML injection Inject 5,000 Units into the skin once.   Past Week   hydrALAZINE  (APRESOLINE ) 50 MG tablet Take 50 mg by mouth 3 (three) times daily.   12/15/2023 Morning   hydrocortisone  cream 1 % Apply 1 Application topically 2 (two) times daily.   Taking  hydrOXYzine  (ATARAX ) 25 MG tablet Take 25 mg by mouth 3 (three) times daily as needed.   12/15/2023 Morning   lacosamide (VIMPAT) 200 MG TABS tablet Place 200 mg into feeding tube 2 (two) times daily.   12/15/2023 Morning   levETIRAcetam (KEPPRA) 100 MG/ML solution Place 5 mLs into feeding tube 2 (two) times daily.   12/15/2023 Morning   mupirocin ointment (BACTROBAN) 2 % Apply 1 Application topically 2 (two) times daily.   12/14/2023 Evening   omeprazole  (PRILOSEC OTC) 20 MG tablet Take 20 mg by mouth daily. Per G-tube   12/15/2023 Morning    ondansetron  (ZOFRAN ) 4 MG/5ML solution Place 5 mLs into feeding tube daily.   12/15/2023 Morning   oxycodone  (OXY-IR) 5 MG capsule Take 5 mg by mouth every 4 (four) hours as needed for pain.   Taking As Needed   polyethylene glycol (MIRALAX  / GLYCOLAX ) 17 g packet Take 17 g by mouth daily.   Taking   sertraline (ZOLOFT) 25 MG tablet Take 25 mg by mouth daily.   12/15/2023 Morning   sevelamer carbonate (RENVELA) 800 MG tablet Place 800 mg into feeding tube 3 (three) times daily with meals.   12/15/2023 Morning   albuterol  (VENTOLIN  HFA) 108 (90 Base) MCG/ACT inhaler Inhale 2 puffs into the lungs every 4 (four) hours as needed.      diazepam  (VALIUM ) 10 MG tablet Take 0.5 tablets (5 mg total) by mouth as directed. (Patient not taking: Reported on 12/15/2023)   Not Taking   pantoprazole  (PROTONIX ) 40 MG tablet Take 1 tablet (40 mg total) by mouth daily. 30 tablet 1    senna-docusate (SENOKOT-S) 8.6-50 MG tablet Take 1 tablet by mouth at bedtime as needed for mild constipation. 20 tablet 0 Unknown   traMADol  (ULTRAM ) 50 MG tablet Take 50-100 mg by mouth 3 (three) times daily as needed for severe pain. (Patient not taking: Reported on 12/15/2023)   Not Taking    Current medications: Current Facility-Administered Medications  Medication Dose Route Frequency Provider Last Rate Last Admin   0.9 %  sodium chloride  infusion   Intravenous Continuous Niu, Xilin, MD 50 mL/hr at 12/16/23 0759 Rate Verify at 12/16/23 0759   0.9 %  sodium chloride  infusion   Intravenous Once Sheril Dines, MD       alteplase  (CATHFLO ACTIVASE ) injection 2 mg  2 mg Intracatheter Once PRN Anola King, NP       aspirin  chewable tablet 81 mg  81 mg Per Tube Daily Niu, Xilin, MD       cefTRIAXone (ROCEPHIN) 2 g in sodium chloride  0.9 % 100 mL IVPB  2 g Intravenous Q24H Niu, Xilin, MD   Stopped at 12/16/23 0753   Chlorhexidine  Gluconate Cloth 2 % PADS 6 each  6 each Topical Q0600 Anola King, NP       cloBAZam (ONFI) 2.5 MG/ML  oral suspension 5 mg  5 mg Per Tube Q12H Niu, Xilin, MD   5 mg at 12/15/23 2359   heparin  injection 1,000 Units  1,000 Units Intracatheter PRN Anola King, NP       heparin  injection 5,000 Units  5,000 Units Subcutaneous Q8H Niu, Xilin, MD   5,000 Units at 12/16/23 0615   hydrALAZINE  (APRESOLINE ) injection 5 mg  5 mg Intravenous Q2H PRN Niu, Xilin, MD       hydrOXYzine  (ATARAX ) tablet 25 mg  25 mg Oral TID PRN Niu, Xilin, MD       ibuprofen  (ADVIL ) tablet 400 mg  400  mg Oral Q6H PRN Niu, Xilin, MD       lacosamide (VIMPAT) tablet 200 mg  200 mg Per Tube BID Niu, Xilin, MD   200 mg at 12/16/23 0000   levETIRAcetam (KEPPRA) 100 MG/ML solution 500 mg  500 mg Per Tube BID Niu, Xilin, MD   500 mg at 12/16/23 0000   LORazepam  (ATIVAN ) injection 2 mg  2 mg Intravenous Q2H PRN Niu, Xilin, MD       mupirocin ointment (BACTROBAN) 2 % 1 Application  1 Application Topical BID Niu, Xilin, MD   1 Application at 12/16/23 0000   nicotine  (NICODERM CQ  - dosed in mg/24 hours) patch 21 mg  21 mg Transdermal Daily Niu, Xilin, MD       omeprazole  (KONVOMEP) 2 mg/mL oral suspension 20 mg  20 mg Oral Daily Niu, Xilin, MD       ondansetron  (ZOFRAN ) injection 4 mg  4 mg Intravenous Q8H PRN Niu, Xilin, MD       oxyCODONE  (Oxy IR/ROXICODONE ) immediate release tablet 5 mg  5 mg Per Tube Q4H PRN Niu, Xilin, MD       senna-docusate (Senokot-S) tablet 1 tablet  1 tablet Oral QHS PRN Niu, Xilin, MD       sertraline (ZOLOFT) tablet 25 mg  25 mg Oral Daily Niu, Xilin, MD       sevelamer carbonate (RENVELA) tablet 800 mg  800 mg Per Tube TID WC Niu, Xilin, MD       sodium hypochlorite (DAKIN'S 1/4 STRENGTH) topical solution 1 Application  1 Application Irrigation Daily Niu, Xilin, MD          Allergies: Allergies  Allergen Reactions   Abilify [Aripiprazole] Other (See Comments)    Tardive dyskinesia Oral   Remeron  Episcopus.Fairy ] Other (See Comments)    Wgt stimulation /gain, Dizziness, Patient says "can tolerate"    Trazodone  And Nefazodone Other (See Comments)    Nightmares/sleep diturbance   Augmentin [Amoxicillin -Pot Clavulanate]    Flexeril [Cyclobenzaprine] Other (See Comments)    Pt states Flexeril makes her feel depressed    Amoxicillin  Diarrhea      Past Medical History: Past Medical History:  Diagnosis Date   Alcohol  use disorder, severe, dependence (HCC) 08/27/2017   Anxiety    Body mass index (BMI) 40.0-44.9, adult (HCC) 07/19/2020   Breast cancer (HCC)    right lumpectemy and lymph node    Cervical spondylosis 05/24/2020   Chronic bilateral low back pain with right-sided sciatica 05/24/2020   Chronic diastolic CHF (congestive heart failure) (HCC) 05/09/2019   Cigarette smoker 12/31/2017   ESRD (end stage renal disease) on dialysis (HCC) 08/22/2021   M-W-F   Essential hypertension 07/06/2017   GERD (gastroesophageal reflux disease)    Hepatitis C virus infection cured after antiviral drug therapy 12/17/2012   Telephone Encounter - Nerissa Bannister, RN - 12/28/2016 9:54 AM EDT Patient called for HCV RNA test results. EOT 08-26-16 - not detected. 12 week post treatment 12-11-16 - not detected. Informed patient that she was cured. Patient verbalized understanding.          Hx of bipolar disorder 01/31/2016   Impaired functional mobility, balance, gait, and endurance 01/09/2021   MRSA infection    of breast incision   Neuroleptic-induced tardive dyskinesia 09/06/2017   Abilify   Opioid use disorder, severe, dependence (HCC) 08/27/2017   Restless legs syndrome 02/13/2013   Formatting of this note might be different from the original. IMPRESSION: Possible. Will follow.   Subacute  dyskinesia due to drug 02/13/2013   Formatting of this note might be different from the original. STORY: Tardive dyskinesia and mild limb dyskinesia, LE>UE which likely due to prolonged antipsychotic used, abilify. Couldn't tolerate artane, gabapentin , clonazepam and xenazine.`E1o3L`IMPRESSION: Well tolerated depakote   250mg  bid, mood aspect is better as well. Will increase to 500mg  bid. ADR was discussed.  RTC 4-6 weeks.   TIA (transient ischemic attack) 2020   P/w BP >230/120 and neurologic symptoms, presumed TIA     Past Surgical History: Past Surgical History:  Procedure Laterality Date   A/V FISTULAGRAM Right 01/29/2022   Procedure: A/V Fistulagram;  Surgeon: Young Hensen, MD;  Location: [facility] INVASIVE CV LAB;  Service: Cardiovascular;  Laterality: Right;   AV FISTULA PLACEMENT Right 05/01/2021   Procedure: RIGHT ARTERIOVENOUS (AV) FISTULA CREATION;  Surgeon: Kayla Part, MD;  Location: [facility] OR;  Service: Vascular;  Laterality: Right;  PERIPHERAL NERVE BLOCK   BACK SURGERY  1990   BASCILIC VEIN TRANSPOSITION Right 09/02/2021   Procedure: RIGHT SECOND STAGE BASILIC VEIN TRANSPOSITION;  Surgeon: Kayla Part, MD;  Location: [facility] OR;  Service: Vascular;  Laterality: Right;  PERIPHERAL NERVE BLOCK   BREAST SURGERY Right 2010   "breast cancer survivor" - states partial mastectomy and nodes   COLONOSCOPY     [facility]   ESOPHAGOGASTRODUODENOSCOPY     [facility]   ESOPHAGOGASTRODUODENOSCOPY  04/18/2020   [facility]   LAPAROSCOPIC CHOLECYSTECTOMY  2002   LAPAROSCOPIC INCISIONAL / UMBILICAL / VENTRAL HERNIA REPAIR  04/13/2018   with BARD 15x 20cm mesh (supraumbilical)   LAPAROSCOPIC LYSIS OF ADHESIONS  07/12/2018   Procedure: LAPAROSCOPIC LYSIS OF ADHESIONS;  Surgeon: Lyn Sanders, MD;  Location: [facility] ORS;  Service: Gynecology;;   MULTIPLE TOOTH EXTRACTIONS     MYOMECTOMY     x 2 prior to hysterectomy   PERIPHERAL VASCULAR BALLOON ANGIOPLASTY Right 01/29/2022   Procedure: PERIPHERAL VASCULAR BALLOON ANGIOPLASTY;  Surgeon: Young Hensen, MD;  Location: [facility] INVASIVE CV LAB;  Service: Cardiovascular;  Laterality: Right;  arm fistula   ROBOTIC ASSISTED BILATERAL SALPINGO OOPHERECTOMY Right 07/12/2018   Procedure: XI ROBOTIC ASSISTED RIGHT SALPINGO OOPHORECTOMY;   Surgeon: Lyn Sanders, MD;  Location: [facility] ORS;  Service: Gynecology;  Laterality: Right;   TOTAL ABDOMINAL HYSTERECTOMY     fibroids    UPPER GASTROINTESTINAL ENDOSCOPY     WISDOM TOOTH EXTRACTION       Family History: Family History  Problem Relation Age of Onset   Heart attack Mother    Breast cancer Mother 31   Dementia Mother    Cancer Father 32       died of bleeding from kidneys   Colon cancer Neg Hx    Esophageal cancer Neg Hx    Stomach cancer Neg Hx    Rectal cancer Neg Hx      Social History: Social History   Socioeconomic History   Marital status: Single    Spouse name: Not on file   Number of children: 0   Years of education: 12   Highest education level: High school graduate  Occupational History   Occupation: Disabled  Tobacco Use   Smoking status: Every Day    Current packs/day: 0.50    Types: Cigarettes    Passive exposure: Never   Smokeless tobacco: Never  Vaping Use   Vaping status: Former   Start date: 06/27/2013   Quit date: 06/14/2017   Devices: Apple Cinnamon-0 mg  Substance  and Sexual Activity   Alcohol  use: Not Currently    Comment: hx over 30 years   Drug use: Not Currently    Comment: h/o IVD use   Sexual activity: Not Currently  Other Topics Concern   Not on file  Social History Narrative   Lives at home alone.   Right-handed.   Caffeine  use: 4 cups coffee/soda   Social Drivers of Corporate investment banker Strain: Not on file  Food Insecurity: Unknown (10/01/2023)   Received from [facility]   Hunger Vital Sign    Worried About Running Out of Food in the Last Year: Patient declined to answer    Ran Out of Food in the Last Year: Patient declined to answer  Transportation Needs: Not on file (10/01/2023)  Physical Activity: Not on file  Stress: Not on file  Social Connections: Not on file  Intimate Partner Violence: Not At Risk (05/25/2023)   Humiliation, Afraid, Rape, and Kick questionnaire    Fear of Current or  Ex-Partner: No    Emotionally Abused: No    Physically Abused: No    Sexually Abused: No     Review of Systems: Review of Systems  Unable to perform ROS: Critical illness    Vital Signs: Blood pressure (!) 109/54, pulse 78, temperature 99.1 F (37.3 C), temperature source Axillary, resp. rate 13, height 5\' 2"  (1.575 m), weight 66.8 kg, SpO2 99%.  Weight trends: Filed Weights   12/15/23 1548  Weight: 66.8 kg    Physical Exam: General: Critically ill-appearing  Head: Normocephalic, atraumatic.   Eyes: Anicteric  Lungs:  Clear to auscultation  Heart: Regular rate and rhythm  Abdomen:  Soft, nontender  Extremities:  No peripheral edema.  Neurologic: Eyes open to name, no verbal response  Skin: Large sacral wound  Access: Rt permcath     Lab results: Basic Metabolic Panel: Recent Labs  Lab 12/15/23 1819 12/16/23 0532  NA 142 139  K 3.6 3.6  CL 103 102  CO2 26 27  GLUCOSE 139* 110*  BUN 45* 53*  CREATININE 3.61* 4.13*  CALCIUM  8.8* 9.0    Liver Function Tests: Recent Labs  Lab 12/15/23 1819 12/16/23 0532  AST 214* 184*  ALT 67* 61*  ALKPHOS 1,035* 974*  BILITOT 0.9 1.4*  PROT 6.9 6.9  ALBUMIN 2.1* 2.1*   No results for input(s): "LIPASE", "AMYLASE" in the last 168 hours. Recent Labs  Lab 12/15/23 1819  AMMONIA <13    CBC: Recent Labs  Lab 12/15/23 1819 12/16/23 0532  WBC 18.2* 21.3*  NEUTROABS 15.8*  --   HGB 7.7* 6.7*  HCT 24.8* 22.3*  MCV 92.5 93.7  PLT 414* 415*    Cardiac Enzymes: Recent Labs  Lab 12/15/23 1819  CKTOTAL 11*    BNP: Invalid input(s): "POCBNP"  CBG: Recent Labs  Lab 12/16/23 0917  GLUCAP 111*    Microbiology: Results for orders placed or performed during the hospital encounter of 12/15/23  Resp panel by RT-PCR (RSV, Flu A&B, Covid) Anterior Nasal Swab     Status: None   Collection Time: 12/15/23  4:11 PM   Specimen: Anterior Nasal Swab  Result Value Ref Range Status   SARS Coronavirus 2 by RT PCR  NEGATIVE NEGATIVE Final    Comment: (NOTE) SARS-CoV-2 target nucleic acids are NOT DETECTED.  The SARS-CoV-2 RNA is generally detectable in upper respiratory specimens during the acute phase of infection. The lowest concentration of SARS-CoV-2 viral copies this assay can detect is  138 copies/mL. A negative result does not preclude SARS-Cov-2 infection and should not be used as the sole basis for treatment or other patient management decisions. A negative result may occur with  improper specimen collection/handling, submission of specimen other than nasopharyngeal swab, presence of viral mutation(s) within the areas targeted by this assay, and inadequate number of viral copies(<138 copies/mL). A negative result must be combined with clinical observations, patient history, and epidemiological information. The expected result is Negative.  Fact Sheet for Patients:  BloggerCourse.com  Fact Sheet for Healthcare Providers:  SeriousBroker.it  This test is no t yet approved or cleared by the [place]  FDA and  has been authorized for detection and/or diagnosis of SARS-CoV-2 by FDA under an Emergency Use Authorization (EUA). This EUA will remain  in effect (meaning this test can be used) for the duration of the COVID-19 declaration under Section 564(b)(1) of the Act, 21 U.S.C.section 360bbb-3(b)(1), unless the authorization is terminated  or revoked sooner.       Influenza A by PCR NEGATIVE NEGATIVE Final   Influenza B by PCR NEGATIVE NEGATIVE Final    Comment: (NOTE) The Xpert Xpress SARS-CoV-2/FLU/RSV plus assay is intended as an aid in the diagnosis of influenza from Nasopharyngeal swab specimens and should not be used as a sole basis for treatment. Nasal washings and aspirates are unacceptable for Xpert Xpress SARS-CoV-2/FLU/RSV testing.  Fact Sheet for Patients: BloggerCourse.com  Fact Sheet for  Healthcare Providers: SeriousBroker.it  This test is not yet approved or cleared by the [place]  FDA and has been authorized for detection and/or diagnosis of SARS-CoV-2 by FDA under an Emergency Use Authorization (EUA). This EUA will remain in effect (meaning this test can be used) for the duration of the COVID-19 declaration under Section 564(b)(1) of the Act, 21 U.S.C. section 360bbb-3(b)(1), unless the authorization is terminated or revoked.     Resp Syncytial Virus by PCR NEGATIVE NEGATIVE Final    Comment: (NOTE) Fact Sheet for Patients: BloggerCourse.com  Fact Sheet for Healthcare Providers: SeriousBroker.it  This test is not yet approved or cleared by the [place]  FDA and has been authorized for detection and/or diagnosis of SARS-CoV-2 by FDA under an Emergency Use Authorization (EUA). This EUA will remain in effect (meaning this test can be used) for the duration of the COVID-19 declaration under Section 564(b)(1) of the Act, 21 U.S.C. section 360bbb-3(b)(1), unless the authorization is terminated or revoked.  Performed at [facility], [address] [place], [place] [address]   Blood Culture (routine x 2)     Status: None (Preliminary result)   Collection Time: 12/15/23  4:12 PM   Specimen: BLOOD  Result Value Ref Range Status   Specimen Description   Final    BLOOD RIGHT ANTECUBITAL Performed at [facility], [address] [place], [place] [address]    Special Requests   Final    BOTTLES DRAWN AEROBIC AND ANAEROBIC Blood Culture results may not be optimal due to an inadequate volume of blood received in culture bottles Performed at [facility], [address], [place], [place] [address]    Culture  Setup Time   Final    GRAM NEGATIVE RODS IN BOTH AEROBIC AND ANAEROBIC BOTTLES Organism ID to follow CRITICAL RESULT CALLED TO, READ BACK BY AND  VERIFIED WITHLatina Pol AT 9147 12/16/23 JG Performed at [facility], [address] [place], [place] [address]    Culture GRAM NEGATIVE RODS  Final   Report  Status PENDING  Incomplete  Aerobic/Anaerobic Culture w Gram Stain (surgical/deep wound)     Status: None (Preliminary result)   Collection Time: 12/15/23  4:12 PM   Specimen: Wound  Result Value Ref Range Status   Specimen Description   Final    WOUND Performed at [facility], [address], [place], [place] [address]    Special Requests   Final    SACRAL Performed at [facility], [address] [place], [place] [address]    Gram Stain NO WBC SEEN RARE GRAM NEGATIVE RODS   Final   Culture   Final    TOO YOUNG TO READ Performed at [facility], [address], [place], [place] [address]    Report Status PENDING  Incomplete  Blood Culture ID Panel (Reflexed)     Status: Abnormal   Collection Time: 12/15/23  4:12 PM  Result Value Ref Range Status   Enterococcus faecalis NOT DETECTED NOT DETECTED Final   Enterococcus Faecium NOT DETECTED NOT DETECTED Final   Listeria monocytogenes NOT DETECTED NOT DETECTED Final   Staphylococcus species NOT DETECTED NOT DETECTED Final   Staphylococcus aureus (BCID) NOT DETECTED NOT DETECTED Final   Staphylococcus epidermidis NOT DETECTED NOT DETECTED Final   Staphylococcus lugdunensis NOT DETECTED NOT DETECTED Final   Streptococcus species NOT DETECTED NOT DETECTED Final   Streptococcus agalactiae NOT DETECTED NOT DETECTED Final   Streptococcus pneumoniae NOT DETECTED NOT DETECTED Final   Streptococcus pyogenes NOT DETECTED NOT DETECTED Final   A.calcoaceticus-baumannii NOT DETECTED NOT DETECTED Final   Bacteroides fragilis NOT DETECTED NOT DETECTED Final   Enterobacterales DETECTED (A) NOT DETECTED Final    Comment: Enterobacterales represent a large order of gram negative bacteria, not a single organism. CRITICAL RESULT CALLED TO, READ BACK  BY AND VERIFIED WITH:  JASON ROBINS AT 7846 12/16/23 JG    Enterobacter cloacae complex NOT DETECTED NOT DETECTED Final   Escherichia coli DETECTED (A) NOT DETECTED Final    Comment: CRITICAL RESULT CALLED TO, READ BACK BY AND VERIFIED WITH:  JASON ROBINS AT 0529 12/16/23 JG    Klebsiella aerogenes NOT DETECTED NOT DETECTED Final   Klebsiella oxytoca NOT DETECTED NOT DETECTED Final   Klebsiella pneumoniae NOT DETECTED NOT DETECTED Final   Proteus species NOT DETECTED NOT DETECTED Final   Salmonella species NOT DETECTED NOT DETECTED Final   Serratia marcescens NOT DETECTED NOT DETECTED Final   Haemophilus influenzae NOT DETECTED NOT DETECTED Final   Neisseria meningitidis NOT DETECTED NOT DETECTED Final   Pseudomonas aeruginosa NOT DETECTED NOT DETECTED Final   Stenotrophomonas maltophilia NOT DETECTED NOT DETECTED Final   Candida albicans NOT DETECTED NOT DETECTED Final   Candida auris NOT DETECTED NOT DETECTED Final   Candida glabrata NOT DETECTED NOT DETECTED Final   Candida krusei NOT DETECTED NOT DETECTED Final   Candida parapsilosis NOT DETECTED NOT DETECTED Final   Candida tropicalis NOT DETECTED NOT DETECTED Final   Cryptococcus neoformans/gattii NOT DETECTED NOT DETECTED Final   CTX-M ESBL NOT DETECTED NOT DETECTED Final   Carbapenem resistance IMP NOT DETECTED NOT DETECTED Final   Carbapenem resistance KPC NOT DETECTED NOT DETECTED Final   Carbapenem resistance NDM NOT DETECTED NOT DETECTED Final   Carbapenem resist OXA 48 LIKE NOT DETECTED NOT DETECTED Final   Carbapenem resistance VIM NOT DETECTED NOT DETECTED Final    Comment: Performed at [facility], [address], [place], [place] [address]  Blood Culture (routine x 2)  Status: None (Preliminary result)   Collection Time: 12/15/23  4:30 PM   Specimen: BLOOD  Result Value Ref Range Status   Specimen Description   Final    BLOOD RIGHT ANTECUBITAL Performed at [facility], [address]  [place], [place] [address]    Special Requests   Final    BOTTLES DRAWN AEROBIC AND ANAEROBIC Blood Culture results may not be optimal due to an inadequate volume of blood received in culture bottles Performed at [facility], [address], [place], [place] [address]    Culture  Setup Time   Final    GRAM NEGATIVE RODS IN BOTH AEROBIC AND ANAEROBIC BOTTLES CRITICAL VALUE NOTED.  VALUE IS CONSISTENT WITH PREVIOUSLY REPORTED AND CALLED VALUE. Performed at [facility], [address] [place], [place] [address]    Culture GRAM NEGATIVE RODS  Final   Report Status PENDING  Incomplete    Coagulation Studies: Recent Labs    12/15/23 1820  LABPROT 16.0*  INR 1.3*    Urinalysis: Recent Labs    12/15/23 1740  COLORURINE AMBER*  LABSPEC 1.017  PHURINE 7.0  GLUCOSEU NEGATIVE  HGBUR SMALL*  BILIRUBINUR NEGATIVE  KETONESUR NEGATIVE  PROTEINUR 100*  NITRITE NEGATIVE  LEUKOCYTESUR LARGE*      Imaging: MR ABDOMEN MRCP W WO CONTAST Result Date: 12/16/2023 CLINICAL DATA:  Right upper quadrant pain. Evaluate for biliary disease. EXAM: MRI ABDOMEN WITHOUT AND WITH CONTRAST (INCLUDING MRCP) TECHNIQUE: Multiplanar multisequence MR imaging of the abdomen was performed both before and after the administration of intravenous contrast. Heavily T2-weighted images of the biliary and pancreatic ducts were obtained, and three-dimensional MRCP images were rendered by post processing. CONTRAST:  7mL GADAVIST GADOBUTROL 1 MMOL/ML IV SOLN COMPARISON:  CT chest, abdomen and pelvis from 12/15/2023 FINDINGS: Note, exam detail is diminished due to motion artifact. Lower chest: Trace bilateral pleural effusions with overlying subpleural consolidation noted in both lung bases. . Hepatobiliary: Within the right lobe of liver there are multifocal areas of mural enhancing T2 hyperintense foci which are favored to represent dilated biliary radicles. The less dilated more central bile ducts within the  right lobe have irregular appearance with multiple focal areas of narrowing giving it a string of pearls like appearance. Status post cholecystectomy. The common bile duct is dilated measuring 2.1 cm proximally with be quite narrowing of the distal 7 mm of the CBD, image 8/14. Debris is noted within the CBD with a few small foci of signal void artifact measuring approximately 3 mm which may reflect small stones versus signal void artifact. Pancreas: No mass, inflammatory changes, or other parenchymal abnormality identified. Spleen:  Within normal limits in size and appearance. Adrenals/Urinary Tract: Normal adrenal glands. Bilateral renal cortical atrophy. No hydronephrosis. Multiple bilateral renal cysts are identified. The largest arises off the upper pole of the left kidney measuring 3.7 x 2.9 cm. Stomach/Bowel: Stomach is normal. No dilated loops of bowel identified within the imaged portions of the abdomen. Vascular/Lymphatic: Normal caliber of the abdominal aorta. The upper abdominal vascularity appears patent. No adenopathy. Other:  No free fluid or fluid collections. Musculoskeletal: Within the right paramidline ventral abdominal wall there is a small subcutaneous nodule measuring 1.5 cm. This is T1 and T2 hyperintense without enhancement and is favored to represent a small hematoma. Possibly due to injection site. Subcutaneous edema is noted along both flanks. No enhancing bone abnormality. IMPRESSION: 1. Exam detail is diminished due to motion artifact. 2. Within the right lobe of  liver there are multifocal areas of mural enhancing T2 hyperintense foci which are favored to represent dilated biliary radicles. The less dilated more central bile ducts within the right lobe have irregular appearance with multiple focal areas of narrowing giving it a string of pearls like appearance. In the acute setting imaging findings are concerning for cholangitis. Differential considerations include multiple peripheral  liver abscesses. Underlying malignancy is considered less favored. Follow-up imaging advised to ensure resolution following appropriate therapy. 3. Status post cholecystectomy. Common bile duct dilatation measures up to 2.1 cm. Abrupt focal narrowing of the distal 7 mm of the CBD is favored to represent underlying stricture possibly postinflammatory or secondary to previously passed stone. Debris is noted within the CBD with a few small foci of signal void artifact measuring approximately 3 mm which may reflect small stones versus signal void artifact. 4. Trace bilateral pleural effusions with overlying subpleural consolidation noted in both lung bases. 5. Bilateral renal cortical atrophy. 6. Small subcutaneous nodule within the right paramidline ventral abdominal wall is favored to represent a small hematoma. Possibly due to injection site. Electronically Signed   By: Kimberley Penman M.D.   On: 12/16/2023 04:45   MR 3D Recon At Scanner Result Date: 12/16/2023 CLINICAL DATA:  Right upper quadrant pain. Evaluate for biliary disease. EXAM: MRI ABDOMEN WITHOUT AND WITH CONTRAST (INCLUDING MRCP) TECHNIQUE: Multiplanar multisequence MR imaging of the abdomen was performed both before and after the administration of intravenous contrast. Heavily T2-weighted images of the biliary and pancreatic ducts were obtained, and three-dimensional MRCP images were rendered by post processing. CONTRAST:  7mL GADAVIST GADOBUTROL 1 MMOL/ML IV SOLN COMPARISON:  CT chest, abdomen and pelvis from 12/15/2023 FINDINGS: Note, exam detail is diminished due to motion artifact. Lower chest: Trace bilateral pleural effusions with overlying subpleural consolidation noted in both lung bases. . Hepatobiliary: Within the right lobe of liver there are multifocal areas of mural enhancing T2 hyperintense foci which are favored to represent dilated biliary radicles. The less dilated more central bile ducts within the right lobe have irregular appearance  with multiple focal areas of narrowing giving it a string of pearls like appearance. Status post cholecystectomy. The common bile duct is dilated measuring 2.1 cm proximally with be quite narrowing of the distal 7 mm of the CBD, image 8/14. Debris is noted within the CBD with a few small foci of signal void artifact measuring approximately 3 mm which may reflect small stones versus signal void artifact. Pancreas: No mass, inflammatory changes, or other parenchymal abnormality identified. Spleen:  Within normal limits in size and appearance. Adrenals/Urinary Tract: Normal adrenal glands. Bilateral renal cortical atrophy. No hydronephrosis. Multiple bilateral renal cysts are identified. The largest arises off the upper pole of the left kidney measuring 3.7 x 2.9 cm. Stomach/Bowel: Stomach is normal. No dilated loops of bowel identified within the imaged portions of the abdomen. Vascular/Lymphatic: Normal caliber of the abdominal aorta. The upper abdominal vascularity appears patent. No adenopathy. Other:  No free fluid or fluid collections. Musculoskeletal: Within the right paramidline ventral abdominal wall there is a small subcutaneous nodule measuring 1.5 cm. This is T1 and T2 hyperintense without enhancement and is favored to represent a small hematoma. Possibly due to injection site. Subcutaneous edema is noted along both flanks. No enhancing bone abnormality. IMPRESSION: 1. Exam detail is diminished due to motion artifact. 2. Within the right lobe of liver there are multifocal areas of mural enhancing T2 hyperintense foci which are favored to represent dilated biliary radicles.  The less dilated more central bile ducts within the right lobe have irregular appearance with multiple focal areas of narrowing giving it a string of pearls like appearance. In the acute setting imaging findings are concerning for cholangitis. Differential considerations include multiple peripheral liver abscesses. Underlying malignancy  is considered less favored. Follow-up imaging advised to ensure resolution following appropriate therapy. 3. Status post cholecystectomy. Common bile duct dilatation measures up to 2.1 cm. Abrupt focal narrowing of the distal 7 mm of the CBD is favored to represent underlying stricture possibly postinflammatory or secondary to previously passed stone. Debris is noted within the CBD with a few small foci of signal void artifact measuring approximately 3 mm which may reflect small stones versus signal void artifact. 4. Trace bilateral pleural effusions with overlying subpleural consolidation noted in both lung bases. 5. Bilateral renal cortical atrophy. 6. Small subcutaneous nodule within the right paramidline ventral abdominal wall is favored to represent a small hematoma. Possibly due to injection site. Electronically Signed   By: Kimberley Penman M.D.   On: 12/16/2023 04:45   CT Head Wo Contrast Result Date: 12/15/2023 CLINICAL DATA:  Mental status change, unknown cause EXAM: CT HEAD WITHOUT CONTRAST TECHNIQUE: Contiguous axial images were obtained from the base of the skull through the vertex without intravenous contrast. RADIATION DOSE REDUCTION: This exam was performed according to the departmental dose-optimization program which includes automated exposure control, adjustment of the mA and/or kV according to patient size and/or use of iterative reconstruction technique. COMPARISON:  Head CT 09/17/2023 FINDINGS: Brain: No intracranial hemorrhage, mass effect, or midline shift. Stable degree of atrophy. No hydrocephalus. The basilar cisterns are patent. Moderate to advanced periventricular and deep white matter hypodensity typical of chronic small vessel ischemia, unchanged. Remote lacunar infarct in the left basal ganglia. Remote left cerebellar infarcts. No evidence of territorial infarct or acute ischemia. No extra-axial or intracranial fluid collection. Vascular: Atherosclerosis of skullbase vasculature  without hyperdense vessel or abnormal calcification. Skull: No fracture or focal lesion. Sinuses/Orbits: No acute finding. Other: None. IMPRESSION: 1. No acute intracranial abnormality. 2. Stable atrophy, chronic small vessel ischemia, and remote infarcts. Electronically Signed   By: Chadwick Colonel M.D.   On: 12/15/2023 18:58   CT CHEST ABDOMEN PELVIS W CONTRAST Result Date: 12/15/2023 CLINICAL DATA:  Sepsis. EXAM: CT CHEST, ABDOMEN, AND PELVIS WITH CONTRAST TECHNIQUE: Multidetector CT imaging of the chest, abdomen and pelvis was performed following the standard protocol during bolus administration of intravenous contrast. RADIATION DOSE REDUCTION: This exam was performed according to the departmental dose-optimization program which includes automated exposure control, adjustment of the mA and/or kV according to patient size and/or use of iterative reconstruction technique. CONTRAST:  OMNIPAQUE  IOHEXOL  300 MG/ML  SOLN COMPARISON:  CT of the abdomen pelvis dated 09/06/2023. FINDINGS: Evaluation of this exam is limited due to respiratory motion. CT CHEST FINDINGS Cardiovascular: There is no cardiomegaly or pericardial effusion. There is coronary vascular calcification and calcification of the mitral annulus. Moderate atherosclerotic calcification of the thoracic aorta. No aneurysmal dilatation or dissection. The central pulmonary arteries are grossly unremarkable. Right IJ catheter with tip in the right atrium. Mediastinum/Nodes: No obvious hilar or mediastinal adenopathy. The esophagus is grossly unremarkable. No mediastinal fluid collection. Lungs/Pleura: There is bibasilar subpleural atelectasis. Pneumonia is less likely but not excluded clinical correlation recommended. No pleural effusion or pneumothorax. The central airways are patent. Musculoskeletal: No acute osseous pathology. CT ABDOMEN PELVIS FINDINGS No intra-abdominal free air or free fluid. Hepatobiliary: There is diffuse biliary  duct  dilatation. Several small hypodense nodular areas in the liver measure up to 15 mm in the right lobe. These are mild evaluated with concerning for intrahepatic abscesses versus less likely metastatic disease. Clinical correlation recommended. The common bile duct measures approximately 2 cm in diameter. No radiopaque calculus noted in the central CBD. Pancreas: Unremarkable. No pancreatic ductal dilatation or surrounding inflammatory changes. Spleen: Normal in size without focal abnormality. Adrenals/Urinary Tract: The right adrenal gland is unremarkable. There is a 2 cm left adrenal nodule: Not evaluated on this CT but present on the prior study. This was previously characterized as adenoma on CT of 11/18/2019. Moderate bilateral renal parenchyma atrophy. Small bilateral renal cysts and hypodense lesions which are too small to characterize. There is no hydronephrosis on either side. The urinary bladder is minimally distended. Stomach/Bowel: Percutaneous gastrostomy with balloon in the proximal body of the stomach. There is moderate to large amount of dense stool throughout the colon and within the rectal vault. There is no bowel obstruction or active inflammation. The appendix is normal. Vascular/Lymphatic: Advanced aortoiliac atherosclerotic disease. The IVC is unremarkable. No portal venous gas. There is no adenopathy. Reproductive: Hysterectomy.  No suspicious adnexal masses. Other: Midline anterior pelvic wall incisional scar. Ventral hernia repair mesh. Multiple subcutaneous nodules in the anterior abdominal wall likely related to subcutaneous injections. No fluid collection. Musculoskeletal: Osteopenia with degenerative changes of the spine. Lower lumbar posterior fusion. Severe arthritic changes of the right hip. No acute osseous pathology. IMPRESSION: 1. Diffuse biliary duct dilatation with several small hypodense nodular areas in the liver concerning for intrahepatic abscesses versus less likely metastatic  disease. Further evaluation with MRI/MRCP is recommended. 2. Bibasilar subpleural atelectasis. Pneumonia is less likely but not excluded. 3. 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 4. Constipation with possible fecal impaction in the rectal vault. No bowel obstruction. Normal appendix. 5.  Aortic Atherosclerosis (ICD10-I70.0). Electronically Signed   By: Angus Bark M.D.   On: 12/15/2023 18:57   DG Chest Port 1 View Result Date: 12/15/2023 CLINICAL DATA:  Question of sepsis to evaluate for abnormality. Dialysis patient. Altered mental status. Fever. EXAM: PORTABLE CHEST 1 VIEW COMPARISON:  09/21/2023 FINDINGS: Right central venous catheter with tip over the right atrium. Postoperative changes in the cervical spine. Shallow inspiration. Heart size and pulmonary vascularity are normal for technique. Lungs are clear. No pleural effusion or pneumothorax. Mediastinal contours appear intact. Degenerative changes in the spine. IMPRESSION: Shallow inspiration.  No evidence of active pulmonary disease. Electronically Signed   By: Boyce Byes M.D.   On: 12/15/2023 17:55     Assessment & Plan: Ms. Juri Dinning is a 68 y.o.  female with  past medical conditions including seizures, chronic biliary duct dilation, CHF, hypertension, stroke, diastolic heart failure, depression anxiety, bipolar, anemia, breast cancer status post right mastectomy, polypharmacy, end-stage renal disease on hemodialysis, who was admitted to [facility] on 12/15/2023 for Elevated LFTs [R79.89] Liver lesion [K76.9] Dilation of biliary tract [K83.8] Sepsis (HCC) [A41.9] Urinary tract infection without hematuria, site unspecified [N39.0] Altered mental status, unspecified altered mental status type [R41.82] Sepsis, due to unspecified organism, unspecified whether acute organ dysfunction present (HCC) [A41.9]    End stage renal disease on hemodialysis. Will continue outpatient TTS schedule. Receiving  dialysis today, UF 0. Next treatment scheduled for Saturday.   2. Sepsis secondary to UTI and wound infection. Blood culture positive for E coli and Enterobacterales. Prescribed Vancomycin , Ceftriaxone and metronidazole.   3. Anemia of  chronic kidney disease Lab Results  Component Value Date   HGB 6.7 (L) 12/16/2023    Due to history of breast cancer, will avoid ESA's. Primary team has ordered blood transfusion. Awaiting type and screen for unit allocation.   4. Secondary Hyperparathyroidism: with outpatient labs: PTH 354 on 09/01/23.   Lab Results  Component Value Date   CALCIUM  9.0 12/16/2023   CAION 1.13 (L) 05/25/2023   PHOS 6.2 (H) 05/26/2023     Patient prescribed auryxia and sevelamer outpatient. Will continue to monitor bone minerals during this admission.   5.  Hypertension with chronic kidney disease. Home regimen includes carvedilol , and hydralazine . Held at this time.  LOS: 1 Anaiyah Anglemyer 6/5/202511:30 AM

## 2023-12-16 NOTE — Progress Notes (Signed)
 PHARMACY - PHYSICIAN COMMUNICATION CRITICAL VALUE ALERT - BLOOD CULTURE IDENTIFICATION (BCID)  Sherry [name] is an 68 y.o. female who presented to [facility] on 12/15/2023 with a chief complaint of  sepsis  Assessment:  E Coli in 4 of 4 bottles, no resistance detected.  (include suspected source if known)  Name of physician (or Provider) Contacted: Elisabeth Guild, NP   Current antibiotics: Vanc, cefepime , metronidazole   Changes to prescribed antibiotics recommended:  - Will d/c current abx and start ceftriaxone 2 gm IV Q24H  Results for orders placed or performed during the hospital encounter of 12/15/23  Blood Culture ID Panel (Reflexed) (Collected: 12/15/2023  4:12 PM)  Result Value Ref Range   Enterococcus faecalis NOT DETECTED NOT DETECTED   Enterococcus Faecium NOT DETECTED NOT DETECTED   Listeria monocytogenes NOT DETECTED NOT DETECTED   Staphylococcus species NOT DETECTED NOT DETECTED   Staphylococcus aureus (BCID) NOT DETECTED NOT DETECTED   Staphylococcus epidermidis NOT DETECTED NOT DETECTED   Staphylococcus lugdunensis NOT DETECTED NOT DETECTED   Streptococcus species NOT DETECTED NOT DETECTED   Streptococcus agalactiae NOT DETECTED NOT DETECTED   Streptococcus pneumoniae NOT DETECTED NOT DETECTED   Streptococcus pyogenes NOT DETECTED NOT DETECTED   A.calcoaceticus-baumannii NOT DETECTED NOT DETECTED   Bacteroides fragilis NOT DETECTED NOT DETECTED   Enterobacterales DETECTED (A) NOT DETECTED   Enterobacter cloacae complex NOT DETECTED NOT DETECTED   Escherichia coli DETECTED (A) NOT DETECTED   Klebsiella aerogenes NOT DETECTED NOT DETECTED   Klebsiella oxytoca NOT DETECTED NOT DETECTED   Klebsiella pneumoniae NOT DETECTED NOT DETECTED   Proteus species NOT DETECTED NOT DETECTED   Salmonella species NOT DETECTED NOT DETECTED   Serratia marcescens NOT DETECTED NOT DETECTED   Haemophilus influenzae NOT DETECTED NOT DETECTED   Neisseria meningitidis NOT DETECTED NOT  DETECTED   Pseudomonas aeruginosa NOT DETECTED NOT DETECTED   Stenotrophomonas maltophilia NOT DETECTED NOT DETECTED   Candida albicans NOT DETECTED NOT DETECTED   Candida auris NOT DETECTED NOT DETECTED   Candida glabrata NOT DETECTED NOT DETECTED   Candida krusei NOT DETECTED NOT DETECTED   Candida parapsilosis NOT DETECTED NOT DETECTED   Candida tropicalis NOT DETECTED NOT DETECTED   Cryptococcus neoformans/gattii NOT DETECTED NOT DETECTED   CTX-M ESBL NOT DETECTED NOT DETECTED   Carbapenem resistance IMP NOT DETECTED NOT DETECTED   Carbapenem resistance KPC NOT DETECTED NOT DETECTED   Carbapenem resistance NDM NOT DETECTED NOT DETECTED   Carbapenem resist OXA 48 LIKE NOT DETECTED NOT DETECTED   Carbapenem resistance VIM NOT DETECTED NOT DETECTED    December Hedtke D 12/16/2023  5:53 AM

## 2023-12-16 NOTE — Progress Notes (Signed)
   12/16/23 1339  Vitals  Temp 98.5 F (36.9 C)  Pulse Rate 82  Resp (!) 30  BP (!) 103/55  SpO2 100 %  O2 Device Room Air  Oxygen Therapy  Patient Activity (if Appropriate) In bed  Pulse Oximetry Type Continuous  Post Treatment  Dialyzer Clearance Lightly streaked  Hemodialysis Intake (mL) 0 mL  Liters Processed 84  Fluid Removed (mL) 0 mL  Tolerated HD Treatment Yes  Post-Hemodialysis Comments Tx complete, Tolerated well  AVG/AVF Arterial Site Held (minutes) 0 minutes  AVG/AVF Venous Site Held (minutes) 0 minutes     12/16/23 1339  Vitals  Temp 98.5 F (36.9 C)  Pulse Rate 82  Resp (!) 30  BP (!) 103/55  SpO2 100 %  O2 Device Room Air  Oxygen Therapy  Patient Activity (if Appropriate) In bed  Pulse Oximetry Type Continuous  Post Treatment  Dialyzer Clearance Lightly streaked  Hemodialysis Intake (mL) 0 mL  Liters Processed 84  Fluid Removed (mL) 0 mL  Tolerated HD Treatment Yes  Post-Hemodialysis Comments Tx complete, Tolerated well  AVG/AVF Arterial Site Held (minutes) 0 minutes  AVG/AVF Venous Site Held (minutes) 0 minutes   Received patient in bed to unit.  Alert and oriented.  Informed consent signed and in chart.   TX duration:3.5  Patient tolerated well.  Transported back to the room  Alert, without acute distress.  Hand-off given to patient's nurse.   Access used: [email or web address] Access issues: no complications  Total UF removed: ran even per MD order Medication(s) given: none  Made Primary RN aware that this pts type and cross is still pending based on her being positive for antibodies and that she will still need to be transfused 1 unit PRBCs ---nephrology is aware [facility]

## 2023-12-16 NOTE — Consult Note (Addendum)
 WOC Nurse Consult Note: Reason for Consult: sacral wound  Wound type: Stage 3 vs 4 Pressure Injury (per MD note does not appear to probe to bone, CT no signs of osteomyelitis)  Pressure Injury POA: Yes Measurement: see nursing flowsheet  Wound bed: 50% red moist 25% tan fibrinous 25% necrotic  Drainage (amount, consistency, odor) foul smelling per MD note  Periwound: Dressing procedure/placement/frequency: Cleanse sacral wound with Vashe wound cleanser Timm Foot [phone]) do not rinse and allow to air dry. Apply Dakin's moistened gauze to wound bed daily x 3 days, cover with dry gauze and ABD pad or silicone foam whichever is preferred.   After 3 days of Dakins will transition to Vashe wet to dry dressings daily.   Patient should be placed on a low air loss mattress for pressure redistribution and moisture management.  POC discussed with bedside nurse. WOC team will not follow. Re-consult if further needs arise.   Thank you,    Ronni Colace MSN, RN-BC, [employer] [phone]

## 2023-12-16 NOTE — ED Notes (Addendum)
 Dr Lesli Rasmussen through secure chat by this RN: "I noticed pts mouth was asymmetrical and was just wondering if that was from a previous stroke. I saw the previous RN's documentation and she noted the lips asymmetrical too. She only responds to pain and does not open her eyes or speak so I can't ask her. Not sure if you have been by to assess pt on morning rounds."  Dr Leory Rands responded: "I saw her already. I asked of you but I was told you were in room 33. H/o stroke but no new stroke on CT. I don't know if twisted mouth is chronic."

## 2023-12-16 NOTE — Consult Note (Signed)
 Infectious Disease     Reason for Consult:Cholangitis, E coli bacteremia    Referring Physician: Dr Leory Rands Date of Admission:  12/15/2023   Principal Problem:   Sepsis (HCC) Active Problems:   Hx of bipolar disorder   Essential hypertension   Cigarette smoker   Chronic diastolic CHF (congestive heart failure) (HCC)   Stroke (HCC)   ESRD on dialysis (HCC)   UTI (urinary tract infection)   Liver lesion   Acute metabolic encephalopathy   Sacral wound   Abnormal liver function   Seizure (HCC)   E coli bacteremia   Cholangitis   HPI: Sherry [name] is a 68 y.o. female with medical history significant for ESRD on HD (on MWF schedule), seizure, chronic biliary duct dilation in the setting of a cholecystectomy which is felt to be physiological and normal per GI consult (this is documented by Dr. Gordon Latus on 02/24/22), chronic hepatitis C, opioid use disorder, stroke, s/p of gastrostomy, hypertension, diastolic CHF, stroke, depression with anxiety, bipolar, anemia,HCV, breast cancer (s/p right mastectomy), polypharmacy now admitted from NH with fevers, AMS. Found to hve temp 102.4, WBC 18,UA > 50  wbc, LFTs markeldy elevated Alk phos. Found to have E coli bacteremia and MRCP with  concern for cholangitis and liver abscesses Started on vanco cefepime and metro. Then narrowed to ceftriaxone  She also has a sacral decub with culture done.   She was previously hospitalized at [facility] in February 2025 for E. coli pneumonia and bacteremia, cerebellar stroke and tonic-clonic seizures in the setting of severe PRES that required intubation and subsequent tracheostomy and PEG tube placement.   Past Medical History:  Diagnosis Date   Alcohol  use disorder, severe, dependence (HCC) 08/27/2017   Anxiety    Body mass index (BMI) 40.0-44.9, adult (HCC) 07/19/2020   Breast cancer (HCC)    right lumpectemy and lymph node    Cervical spondylosis 05/24/2020   Chronic bilateral low back pain with right-sided  sciatica 05/24/2020   Chronic diastolic CHF (congestive heart failure) (HCC) 05/09/2019   Cigarette smoker 12/31/2017   ESRD (end stage renal disease) on dialysis (HCC) 08/22/2021   M-W-F   Essential hypertension 07/06/2017   GERD (gastroesophageal reflux disease)    Hepatitis C virus infection cured after antiviral drug therapy 12/17/2012   Telephone Encounter - Nerissa Bannister, RN - 12/28/2016 9:54 AM EDT Patient called for HCV RNA test results. EOT 08-26-16 - not detected. 12 week post treatment 12-11-16 - not detected. Informed patient that she was cured. Patient verbalized understanding.          Hx of bipolar disorder 01/31/2016   Impaired functional mobility, balance, gait, and endurance 01/09/2021   MRSA infection    of breast incision   Neuroleptic-induced tardive dyskinesia 09/06/2017   Abilify   Opioid use disorder, severe, dependence (HCC) 08/27/2017   Restless legs syndrome 02/13/2013   Formatting of this note might be different from the original. IMPRESSION: Possible. Will follow.   Subacute dyskinesia due to drug 02/13/2013   Formatting of this note might be different from the original. STORY: Tardive dyskinesia and mild limb dyskinesia, LE>UE which likely due to prolonged antipsychotic used, abilify. Couldn't tolerate artane, gabapentin , clonazepam and xenazine.`E1o3L`IMPRESSION: Well tolerated depakote  250mg  bid, mood aspect is better as well. Will increase to 500mg  bid. ADR was discussed.  RTC 4-6 weeks.   TIA (transient ischemic attack) 2020   P/w BP >230/120 and neurologic symptoms, presumed TIA   Past Surgical History:  Procedure Laterality Date  A/V FISTULAGRAM Right 01/29/2022   Procedure: A/V Fistulagram;  Surgeon: Young Hensen, MD;  Location: [facility] INVASIVE CV LAB;  Service: Cardiovascular;  Laterality: Right;   AV FISTULA PLACEMENT Right 05/01/2021   Procedure: RIGHT ARTERIOVENOUS (AV) FISTULA CREATION;  Surgeon: Kayla Part, MD;  Location: [facility] OR;  Service:  Vascular;  Laterality: Right;  PERIPHERAL NERVE BLOCK   BACK SURGERY  1990   BASCILIC VEIN TRANSPOSITION Right 09/02/2021   Procedure: RIGHT SECOND STAGE BASILIC VEIN TRANSPOSITION;  Surgeon: Kayla Part, MD;  Location: [facility] OR;  Service: Vascular;  Laterality: Right;  PERIPHERAL NERVE BLOCK   BREAST SURGERY Right 2010   "breast cancer survivor" - states partial mastectomy and nodes   COLONOSCOPY     [facility]   ESOPHAGOGASTRODUODENOSCOPY     [facility]   ESOPHAGOGASTRODUODENOSCOPY  04/18/2020   [facility]   LAPAROSCOPIC CHOLECYSTECTOMY  2002   LAPAROSCOPIC INCISIONAL / UMBILICAL / VENTRAL HERNIA REPAIR  04/13/2018   with BARD 15x 20cm mesh (supraumbilical)   LAPAROSCOPIC LYSIS OF ADHESIONS  07/12/2018   Procedure: LAPAROSCOPIC LYSIS OF ADHESIONS;  Surgeon: Lyn Sanders, MD;  Location: [facility] ORS;  Service: Gynecology;;   MULTIPLE TOOTH EXTRACTIONS     MYOMECTOMY     x 2 prior to hysterectomy   PERIPHERAL VASCULAR BALLOON ANGIOPLASTY Right 01/29/2022   Procedure: PERIPHERAL VASCULAR BALLOON ANGIOPLASTY;  Surgeon: Young Hensen, MD;  Location: [facility] INVASIVE CV LAB;  Service: Cardiovascular;  Laterality: Right;  arm fistula   ROBOTIC ASSISTED BILATERAL SALPINGO OOPHERECTOMY Right 07/12/2018   Procedure: XI ROBOTIC ASSISTED RIGHT SALPINGO OOPHORECTOMY;  Surgeon: Lyn Sanders, MD;  Location: [facility] ORS;  Service: Gynecology;  Laterality: Right;   TOTAL ABDOMINAL HYSTERECTOMY     fibroids    UPPER GASTROINTESTINAL ENDOSCOPY     WISDOM TOOTH EXTRACTION     Social History   Tobacco Use   Smoking status: Every Day    Current packs/day: 0.50    Types: Cigarettes    Passive exposure: Never   Smokeless tobacco: Never  Vaping Use   Vaping status: Former   Start date: 06/27/2013   Quit date: 06/14/2017   Devices: Apple Cinnamon-0 mg  Substance Use Topics   Alcohol  use: Not Currently    Comment: hx over 30 years   Drug use: Not Currently    Comment: h/o IVD  use   Family History  Problem Relation Age of Onset   Heart attack Mother    Breast cancer Mother 71   Dementia Mother    Cancer Father 50       died of bleeding from kidneys   Colon cancer Neg Hx    Esophageal cancer Neg Hx    Stomach cancer Neg Hx    Rectal cancer Neg Hx     Allergies:  Allergies  Allergen Reactions   Abilify [Aripiprazole] Other (See Comments)    Tardive dyskinesia Oral   Remeron  [Mirtazapine ] Other (See Comments)    Wgt stimulation /gain, Dizziness, Patient says "can tolerate"   Trazodone  And Nefazodone Other (See Comments)    Nightmares/sleep diturbance   Augmentin [Amoxicillin -Pot Clavulanate]    Flexeril [Cyclobenzaprine] Other (See Comments)    Pt states Flexeril makes her feel depressed    Amoxicillin  Diarrhea    Current antibiotics: Antibiotics Given (last 72 hours)     Date/Time Action Medication Dose Rate   12/15/23 1725 New Bag/Given   ceFEPIme (MAXIPIME) 2 g in sodium chloride  0.9 % 100  mL IVPB 2 g 200 mL/hr   12/15/23 1850 New Bag/Given  [Due to IV acsess difficulty.]   metroNIDAZOLE (FLAGYL) IVPB 500 mg 500 mg 100 mL/hr   12/15/23 2004 New Bag/Given   vancomycin  (VANCOCIN ) IVPB 1000 mg/200 mL premix 1,000 mg 200 mL/hr   12/15/23 2359 New Bag/Given   vancomycin  (VANCOREADY) IVPB 500 mg/100 mL 500 mg 100 mL/hr   12/16/23 1610 New Bag/Given   cefTRIAXone (ROCEPHIN) 2 g in sodium chloride  0.9 % 100 mL IVPB 2 g 200 mL/hr       MEDICATIONS:  aspirin   81 mg Per Tube Daily   Chlorhexidine  Gluconate Cloth  6 each Topical Q0600   cloBAZam  5 mg Per Tube Q12H   heparin   5,000 Units Subcutaneous Q8H   lacosamide  200 mg Per Tube BID   levETIRAcetam  500 mg Per Tube BID   mupirocin ointment  1 Application Topical BID   nicotine   21 mg Transdermal Daily   omeprazole   20 mg Oral Daily   sertraline  25 mg Oral Daily   sevelamer carbonate  800 mg Per Tube TID WC   sodium hypochlorite  1 Application Irrigation Daily    Review of Systems -  11 systems reviewed and negative per HPI   OBJECTIVE: Temp:  [98.5 F (36.9 C)-102.9 F (39.4 C)] 98.5 F (36.9 C) (06/05 1339) Pulse Rate:  [77-95] 82 (06/05 1339) Resp:  [12-30] 30 (06/05 1339) BP: (90-150)/(49-72) 103/55 (06/05 1339) SpO2:  [92 %-100 %] 100 % (06/05 1339) Weight:  [66.8 kg] 66.8 kg (06/04 1548) Physical Exam  Constitutional: chroniclaly ill appearing, not oriented. Moaning in pain  HENT: [place]/AT, PERRLA, no scleral icterus Mouth/Throat: Oropharynx is dry Cardiovascular: Normal rate, regular rhythm Pulmonary/Chest: Effort normal and breath sounds normal. No respiratory distress.  has no wheezes.  Neck = supple, no nuchal rigidity Abdominal: Soft. Mild distention Lymphadenopathy: no cervical adenopathy. No axillary adenopathy Neurological: confused HD cath R upper chest Skin: sacreal decub as below   LABS: Results for orders placed or performed during the hospital encounter of 12/15/23 (from the past 48 hours)  Resp panel by RT-PCR (RSV, Flu A&B, Covid) Anterior Nasal Swab     Status: None   Collection Time: 12/15/23  4:11 PM   Specimen: Anterior Nasal Swab  Result Value Ref Range   SARS Coronavirus 2 by RT PCR NEGATIVE NEGATIVE    Comment: (NOTE) SARS-CoV-2 target nucleic acids are NOT DETECTED.  The SARS-CoV-2 RNA is generally detectable in upper respiratory specimens during the acute phase of infection. The lowest concentration of SARS-CoV-2 viral copies this assay can detect is 138 copies/mL. A negative result does not preclude SARS-Cov-2 infection and should not be used as the sole basis for treatment or other patient management decisions. A negative result may occur with  improper specimen collection/handling, submission of specimen other than nasopharyngeal swab, presence of viral mutation(s) within the areas targeted by this assay, and inadequate number of viral copies(<138 copies/mL). A negative result must be combined with clinical observations,  patient history, and epidemiological information. The expected result is Negative.  Fact Sheet for Patients:  BloggerCourse.com  Fact Sheet for Healthcare Providers:  SeriousBroker.it  This test is no t yet approved or cleared by the [place]  FDA and  has been authorized for detection and/or diagnosis of SARS-CoV-2 by FDA under an Emergency Use Authorization (EUA). This EUA will remain  in effect (meaning this test can be used) for the duration of the COVID-19  declaration under Section 564(b)(1) of the Act, 21 U.S.C.section 360bbb-3(b)(1), unless the authorization is terminated  or revoked sooner.       Influenza A by PCR NEGATIVE NEGATIVE   Influenza B by PCR NEGATIVE NEGATIVE    Comment: (NOTE) The Xpert Xpress SARS-CoV-2/FLU/RSV plus assay is intended as an aid in the diagnosis of influenza from Nasopharyngeal swab specimens and should not be used as a sole basis for treatment. Nasal washings and aspirates are unacceptable for Xpert Xpress SARS-CoV-2/FLU/RSV testing.  Fact Sheet for Patients: BloggerCourse.com  Fact Sheet for Healthcare Providers: SeriousBroker.it  This test is not yet approved or cleared by the [place]  FDA and has been authorized for detection and/or diagnosis of SARS-CoV-2 by FDA under an Emergency Use Authorization (EUA). This EUA will remain in effect (meaning this test can be used) for the duration of the COVID-19 declaration under Section 564(b)(1) of the Act, 21 U.S.C. section 360bbb-3(b)(1), unless the authorization is terminated or revoked.     Resp Syncytial Virus by PCR NEGATIVE NEGATIVE    Comment: (NOTE) Fact Sheet for Patients: BloggerCourse.com  Fact Sheet for Healthcare Providers: SeriousBroker.it  This test is not yet approved or cleared by the [place]  FDA and has  been authorized for detection and/or diagnosis of SARS-CoV-2 by FDA under an Emergency Use Authorization (EUA). This EUA will remain in effect (meaning this test can be used) for the duration of the COVID-19 declaration under Section 564(b)(1) of the Act, 21 U.S.C. section 360bbb-3(b)(1), unless the authorization is terminated or revoked.  Performed at [facility], [address] [place], [place] [address]   Blood Culture (routine x 2)     Status: None (Preliminary result)   Collection Time: 12/15/23  4:12 PM   Specimen: BLOOD  Result Value Ref Range   Specimen Description      BLOOD RIGHT ANTECUBITAL Performed at [facility], [address] [place], [place] [address]    Special Requests      BOTTLES DRAWN AEROBIC AND ANAEROBIC Blood Culture results may not be optimal due to an inadequate volume of blood received in culture bottles Performed at [facility], [address], [place], [place] [address]    Culture  Setup Time      GRAM NEGATIVE RODS IN BOTH AEROBIC AND ANAEROBIC BOTTLES Organism ID to follow CRITICAL RESULT CALLED TO, READ BACK BY AND VERIFIED WITHLatina Pol AT 5621 12/16/23 JG Performed at [facility], [address] [place], [place] [address]    Culture GRAM NEGATIVE RODS    Report Status PENDING   Aerobic/Anaerobic Culture w Gram Stain (surgical/deep wound)     Status: None (Preliminary result)   Collection Time: 12/15/23  4:12 PM   Specimen: Wound  Result Value Ref Range   Specimen Description      WOUND Performed at [facility], [address], [place], [place] [address]    Special Requests      SACRAL Performed at [facility], [address] [place], [place] [address]    Gram Stain NO WBC SEEN RARE GRAM NEGATIVE RODS     Culture      TOO YOUNG TO READ Performed at [facility], [address] [place], [place] [address]    Report Status PENDING   Blood Culture ID Panel  (Reflexed)     Status: Abnormal   Collection Time: 12/15/23  4:12 PM  Result Value Ref Range   Enterococcus faecalis  NOT DETECTED NOT DETECTED   Enterococcus Faecium NOT DETECTED NOT DETECTED   Listeria monocytogenes NOT DETECTED NOT DETECTED   Staphylococcus species NOT DETECTED NOT DETECTED   Staphylococcus aureus (BCID) NOT DETECTED NOT DETECTED   Staphylococcus epidermidis NOT DETECTED NOT DETECTED   Staphylococcus lugdunensis NOT DETECTED NOT DETECTED   Streptococcus species NOT DETECTED NOT DETECTED   Streptococcus agalactiae NOT DETECTED NOT DETECTED   Streptococcus pneumoniae NOT DETECTED NOT DETECTED   Streptococcus pyogenes NOT DETECTED NOT DETECTED   A.calcoaceticus-baumannii NOT DETECTED NOT DETECTED   Bacteroides fragilis NOT DETECTED NOT DETECTED   Enterobacterales DETECTED (A) NOT DETECTED    Comment: Enterobacterales represent a large order of gram negative bacteria, not a single organism. CRITICAL RESULT CALLED TO, READ BACK BY AND VERIFIED WITH:  JASON ROBINS AT 9604 12/16/23 JG    Enterobacter cloacae complex NOT DETECTED NOT DETECTED   Escherichia coli DETECTED (A) NOT DETECTED    Comment: CRITICAL RESULT CALLED TO, READ BACK BY AND VERIFIED WITH:  JASON ROBINS AT 0529 12/16/23 JG    Klebsiella aerogenes NOT DETECTED NOT DETECTED   Klebsiella oxytoca NOT DETECTED NOT DETECTED   Klebsiella pneumoniae NOT DETECTED NOT DETECTED   Proteus species NOT DETECTED NOT DETECTED   Salmonella species NOT DETECTED NOT DETECTED   Serratia marcescens NOT DETECTED NOT DETECTED   Haemophilus influenzae NOT DETECTED NOT DETECTED   Neisseria meningitidis NOT DETECTED NOT DETECTED   Pseudomonas aeruginosa NOT DETECTED NOT DETECTED   Stenotrophomonas maltophilia NOT DETECTED NOT DETECTED   Candida albicans NOT DETECTED NOT DETECTED   Candida auris NOT DETECTED NOT DETECTED   Candida glabrata NOT DETECTED NOT DETECTED   Candida krusei NOT DETECTED NOT DETECTED   Candida parapsilosis  NOT DETECTED NOT DETECTED   Candida tropicalis NOT DETECTED NOT DETECTED   Cryptococcus neoformans/gattii NOT DETECTED NOT DETECTED   CTX-M ESBL NOT DETECTED NOT DETECTED   Carbapenem resistance IMP NOT DETECTED NOT DETECTED   Carbapenem resistance KPC NOT DETECTED NOT DETECTED   Carbapenem resistance NDM NOT DETECTED NOT DETECTED   Carbapenem resist OXA 48 LIKE NOT DETECTED NOT DETECTED   Carbapenem resistance VIM NOT DETECTED NOT DETECTED    Comment: Performed at [facility], [address] [place], [place] [address]  Lactic acid, plasma     Status: None   Collection Time: 12/15/23  4:30 PM  Result Value Ref Range   Lactic Acid, Venous 1.4 0.5 - 1.9 mmol/L    Comment: Performed at [facility], [address] [place], [place] [address]  Blood Culture (routine x 2)     Status: None (Preliminary result)   Collection Time: 12/15/23  4:30 PM   Specimen: BLOOD  Result Value Ref Range   Specimen Description      BLOOD RIGHT ANTECUBITAL Performed at [facility], [address] [place], [place] [address]    Special Requests      BOTTLES DRAWN AEROBIC AND ANAEROBIC Blood Culture results may not be optimal due to an inadequate volume of blood received in culture bottles Performed at [facility], [address] [place], [place] [address]    Culture  Setup Time      GRAM NEGATIVE RODS IN BOTH AEROBIC AND ANAEROBIC BOTTLES CRITICAL VALUE NOTED.  VALUE IS CONSISTENT WITH PREVIOUSLY REPORTED AND CALLED VALUE. Performed at [facility], [address] [place], [place] [address]    Culture GRAM NEGATIVE RODS    Report Status PENDING   Troponin  I (High Sensitivity)     Status: Abnormal   Collection Time: 12/15/23  4:30 PM  Result Value Ref Range   Troponin I (High Sensitivity) 25 (H) <18 ng/L    Comment: (NOTE) Elevated high sensitivity troponin I (hsTnI) values and significant  changes across serial measurements may suggest ACS but  many other  chronic and acute conditions are known to elevate hsTnI results.  Refer to the "Links" section for chest pain algorithms and additional  guidance. Performed at [facility], [address] [place], [place] [address]   Urinalysis, w/ Reflex to Culture (Infection Suspected) -Urine, Catheterized     Status: Abnormal   Collection Time: 12/15/23  5:40 PM  Result Value Ref Range   Specimen Source URINE, CATHETERIZED    Color, Urine AMBER (A) YELLOW    Comment: BIOCHEMICALS MAY BE AFFECTED BY COLOR   APPearance TURBID (A) CLEAR   Specific Gravity, Urine 1.017 1.005 - 1.030   pH 7.0 5.0 - 8.0   Glucose, UA NEGATIVE NEGATIVE mg/dL   Hgb urine dipstick SMALL (A) NEGATIVE   Bilirubin Urine NEGATIVE NEGATIVE   Ketones, ur NEGATIVE NEGATIVE mg/dL   Protein, ur 604 (A) NEGATIVE mg/dL   Nitrite NEGATIVE NEGATIVE   Leukocytes,Ua LARGE (A) NEGATIVE   RBC / HPF >50 0 - 5 RBC/hpf   WBC, UA >50 0 - 5 WBC/hpf    Comment:        Reflex urine culture not performed if WBC <=10, OR if Squamous epithelial cells >5. If Squamous epithelial cells >5 suggest recollection.    Bacteria, UA MANY (A) NONE SEEN   Squamous Epithelial / HPF 11-20 0 - 5 /HPF   WBC Clumps PRESENT     Comment: Performed at [facility], [address] [place], [place] [address]  Urine Drug Screen, Qualitative [facility] only)     Status: Abnormal   Collection Time: 12/15/23  5:40 PM  Result Value Ref Range   Tricyclic, Ur Screen NONE DETECTED NONE DETECTED   Amphetamines, Ur Screen NONE DETECTED NONE DETECTED   MDMA (Ecstasy)Ur Screen NONE DETECTED NONE DETECTED   Cocaine Metabolite,Ur [place] NONE DETECTED NONE DETECTED   Opiate, Ur Screen NONE DETECTED NONE DETECTED   Phencyclidine (PCP) Ur S NONE DETECTED NONE DETECTED   Cannabinoid 50 Ng, Ur [place] NONE DETECTED NONE DETECTED   Barbiturates, Ur Screen NONE DETECTED NONE DETECTED   Benzodiazepine, Ur Scrn POSITIVE (A) NONE DETECTED   Methadone Scn, Ur  NONE DETECTED NONE DETECTED    Comment: (NOTE) Tricyclics + metabolites, urine    Cutoff 1000 ng/mL Amphetamines + metabolites, urine  Cutoff 1000 ng/mL MDMA (Ecstasy), urine              Cutoff 500 ng/mL Cocaine Metabolite, urine          Cutoff 300 ng/mL Opiate + metabolites, urine        Cutoff 300 ng/mL Phencyclidine (PCP), urine         Cutoff 25 ng/mL Cannabinoid, urine                 Cutoff 50 ng/mL Barbiturates + metabolites, urine  Cutoff 200 ng/mL Benzodiazepine, urine              Cutoff 200 ng/mL Methadone, urine                   Cutoff 300 ng/mL  The urine drug screen provides only a preliminary, unconfirmed analytical test result and  should not be used for non-medical purposes. Clinical consideration and professional judgment should be applied to any positive drug screen result due to possible interfering substances. A more specific alternate chemical method must be used in order to obtain a confirmed analytical result. Gas chromatography / mass spectrometry (GC/MS) is the preferred confirm atory method. Performed at [facility], [address] [place], [place] [address]   Lactic acid, plasma     Status: None   Collection Time: 12/15/23  6:19 PM  Result Value Ref Range   Lactic Acid, Venous 1.1 0.5 - 1.9 mmol/L    Comment: Performed at [facility], [address] [place], [place] [address]  Ammonia     Status: None   Collection Time: 12/15/23  6:19 PM  Result Value Ref Range   Ammonia <13 9 - 35 umol/L    Comment: Performed at [facility], [address] [place], [place] [address]  CBC with Differential/Platelet     Status: Abnormal   Collection Time: 12/15/23  6:19 PM  Result Value Ref Range   WBC 18.2 (H) 4.0 - 10.5 K/uL   RBC 2.68 (L) 3.87 - 5.11 MIL/uL   Hemoglobin 7.7 (L) 12.0 - 15.0 g/dL   HCT 91.4 (L) 78.2 - 95.6 %   MCV 92.5 80.0 - 100.0 fL   MCH 28.7 26.0 - 34.0 pg   MCHC 31.0 30.0 - 36.0 g/dL   RDW 21.3 (H) 08.6  - 15.5 %   Platelets 414 (H) 150 - 400 K/uL   nRBC 0.0 0.0 - 0.2 %   Neutrophils Relative % 87 %   Neutro Abs 15.8 (H) 1.7 - 7.7 K/uL   Lymphocytes Relative 5 %   Lymphs Abs 0.9 0.7 - 4.0 K/uL   Monocytes Relative 7 %   Monocytes Absolute 1.3 (H) 0.1 - 1.0 K/uL   Eosinophils Relative 0 %   Eosinophils Absolute 0.0 0.0 - 0.5 K/uL   Basophils Relative 0 %   Basophils Absolute 0.1 0.0 - 0.1 K/uL   Immature Granulocytes 1 %   Abs Immature Granulocytes 0.14 (H) 0.00 - 0.07 K/uL    Comment: Performed at [facility], [address] [place], [place] [address]  CK     Status: Abnormal   Collection Time: 12/15/23  6:19 PM  Result Value Ref Range   Total CK 11 (L) 38 - 234 U/L    Comment: Performed at [facility], [address] [place], [place] [address]  Comprehensive metabolic panel with GFR     Status: Abnormal   Collection Time: 12/15/23  6:19 PM  Result Value Ref Range   Sodium 142 135 - 145 mmol/L   Potassium 3.6 3.5 - 5.1 mmol/L   Chloride 103 98 - 111 mmol/L   CO2 26 22 - 32 mmol/L   Glucose, Bld 139 (H) 70 - 99 mg/dL    Comment: Glucose reference range applies only to samples taken after fasting for at least 8 hours.   BUN 45 (H) 8 - 23 mg/dL   Creatinine, Ser 2.84 (H) 0.44 - 1.00 mg/dL   Calcium  8.8 (L) 8.9 - 10.3 mg/dL   Total Protein 6.9 6.5 - 8.1 g/dL   Albumin 2.1 (L) 3.5 - 5.0 g/dL   AST 132 (H) 15 - 41 U/L   ALT 67 (H) 0 - 44 U/L   Alkaline Phosphatase 1,035 (H) 38 - 126 U/L   Total Bilirubin 0.9 0.0 - 1.2 mg/dL  GFR, Estimated 13 (L) >60 mL/min    Comment: (NOTE) Calculated using the CKD-EPI Creatinine Equation (2021)    Anion gap 13 5 - 15    Comment: Performed at [facility], [address] [place], [place] [address]  Troponin I (High Sensitivity)     Status: Abnormal   Collection Time: 12/15/23  6:20 PM  Result Value Ref Range   Troponin I (High Sensitivity) 22 (H) <18 ng/L    Comment: (NOTE) Elevated high sensitivity  troponin I (hsTnI) values and significant  changes across serial measurements may suggest ACS but many other  chronic and acute conditions are known to elevate hsTnI results.  Refer to the "Links" section for chest pain algorithms and additional  guidance. Performed at [facility], [address] [place], [place] [address]   Protime-INR     Status: Abnormal   Collection Time: 12/15/23  6:20 PM  Result Value Ref Range   Prothrombin Time 16.0 (H) 11.4 - 15.2 seconds   INR 1.3 (H) 0.8 - 1.2    Comment: (NOTE) INR goal varies based on device and disease states. Performed at [facility], [address] [address] [place], [place] [address]   APTT     Status: Abnormal   Collection Time: 12/15/23  7:55 PM  Result Value Ref Range   aPTT 40 (H) 24 - 36 seconds    Comment:        IF BASELINE aPTT IS ELEVATED, SUGGEST PATIENT RISK ASSESSMENT BE USED TO DETERMINE APPROPRIATE ANTICOAGULANT THERAPY. Performed at [facility], [address] [place], [place] [address]   Comprehensive metabolic panel     Status: Abnormal   Collection Time: 12/16/23  5:32 AM  Result Value Ref Range   Sodium 139 135 - 145 mmol/L   Potassium 3.6 3.5 - 5.1 mmol/L   Chloride 102 98 - 111 mmol/L   CO2 27 22 - 32 mmol/L   Glucose, Bld 110 (H) 70 - 99 mg/dL    Comment: Glucose reference range applies only to samples taken after fasting for at least 8 hours.   BUN 53 (H) 8 - 23 mg/dL   Creatinine, Ser 1.30 (H) 0.44 - 1.00 mg/dL   Calcium  9.0 8.9 - 10.3 mg/dL   Total Protein 6.9 6.5 - 8.1 g/dL   Albumin 2.1 (L) 3.5 - 5.0 g/dL   AST 865 (H) 15 - 41 U/L   ALT 61 (H) 0 - 44 U/L   Alkaline Phosphatase 974 (H) 38 - 126 U/L   Total Bilirubin 1.4 (H) 0.0 - 1.2 mg/dL   GFR, Estimated 11 (L) >60 mL/min    Comment: (NOTE) Calculated using the CKD-EPI Creatinine Equation (2021)    Anion gap 10 5 - 15    Comment: Performed at [facility], [address] [place], [place] [address]   CBC     Status: Abnormal   Collection Time: 12/16/23  5:32 AM  Result Value Ref Range   WBC 21.3 (H) 4.0 - 10.5 K/uL   RBC 2.38 (L) 3.87 - 5.11 MIL/uL   Hemoglobin 6.7 (L) 12.0 - 15.0 g/dL   HCT 62.9 (L) 52.8 - 41.3 %   MCV 93.7 80.0 - 100.0 fL   MCH 28.2 26.0 - 34.0 pg   MCHC 30.0 30.0 - 36.0 g/dL   RDW 24.4 (H) 01.0 - 27.2 %   Platelets 415 (H) 150 - 400 K/uL   nRBC 0.0 0.0 - 0.2 %  Comment: Performed at [facility], [address] [place], [place] [address]  Iron and TIBC     Status: Abnormal   Collection Time: 12/16/23  5:32 AM  Result Value Ref Range   Iron 7 (L) 28 - 170 ug/dL   TIBC 604 (L) 540 - 981 ug/dL   Saturation Ratios 4 (L) 10.4 - 31.8 %   UIBC 151 ug/dL    Comment: Performed at [facility], [address] [place], [place] [address]  Ferritin     Status: Abnormal   Collection Time: 12/16/23  5:32 AM  Result Value Ref Range   Ferritin 3,187 (H) 11 - 307 ng/mL    Comment: Performed at [facility], [address] [place], [place] [address]  Prepare RBC (crossmatch)     Status: None (Preliminary result)   Collection Time: 12/16/23  8:21 AM  Result Value Ref Range   Order Confirmation PENDING   Type and screen [facility]     Status: None (Preliminary result)   Collection Time: 12/16/23  9:06 AM  Result Value Ref Range   ABO/RH(D) O POS    Antibody Screen POS    Sample Expiration 12/19/2023,2359    Antibody Identification PENDING    DAT, IgG NEG    DAT, complement      NEG Performed at [facility], [address] [place], [place] [address]   CBG monitoring, ED     Status: Abnormal   Collection Time: 12/16/23  9:17 AM  Result Value Ref Range   Glucose-Capillary 111 (H) 70 - 99 mg/dL    Comment: Glucose reference range applies only to samples taken after fasting for at least 8 hours.   No components found for: "ESR", "C REACTIVE PROTEIN" MICRO: Recent Results (from the past 720 hours)   Resp panel by RT-PCR (RSV, Flu A&B, Covid) Anterior Nasal Swab     Status: None   Collection Time: 12/15/23  4:11 PM   Specimen: Anterior Nasal Swab  Result Value Ref Range Status   SARS Coronavirus 2 by RT PCR NEGATIVE NEGATIVE Final    Comment: (NOTE) SARS-CoV-2 target nucleic acids are NOT DETECTED.  The SARS-CoV-2 RNA is generally detectable in upper respiratory specimens during the acute phase of infection. The lowest concentration of SARS-CoV-2 viral copies this assay can detect is 138 copies/mL. A negative result does not preclude SARS-Cov-2 infection and should not be used as the sole basis for treatment or other patient management decisions. A negative result may occur with  improper specimen collection/handling, submission of specimen other than nasopharyngeal swab, presence of viral mutation(s) within the areas targeted by this assay, and inadequate number of viral copies(<138 copies/mL). A negative result must be combined with clinical observations, patient history, and epidemiological information. The expected result is Negative.  Fact Sheet for Patients:  BloggerCourse.com  Fact Sheet for Healthcare Providers:  SeriousBroker.it  This test is no t yet approved or cleared by the [place]  FDA and  has been authorized for detection and/or diagnosis of SARS-CoV-2 by FDA under an Emergency Use Authorization (EUA). This EUA will remain  in effect (meaning this test can be used) for the duration of the COVID-19 declaration under Section 564(b)(1) of the Act, 21 U.S.C.section 360bbb-3(b)(1), unless the authorization is terminated  or revoked sooner.       Influenza A by PCR NEGATIVE NEGATIVE Final   Influenza B by PCR NEGATIVE NEGATIVE Final    Comment: (NOTE) The Xpert  Xpress SARS-CoV-2/FLU/RSV plus assay is intended as an aid in the diagnosis of influenza from Nasopharyngeal swab specimens and should not be used  as a sole basis for treatment. Nasal washings and aspirates are unacceptable for Xpert Xpress SARS-CoV-2/FLU/RSV testing.  Fact Sheet for Patients: BloggerCourse.com  Fact Sheet for Healthcare Providers: SeriousBroker.it  This test is not yet approved or cleared by the [place]  FDA and has been authorized for detection and/or diagnosis of SARS-CoV-2 by FDA under an Emergency Use Authorization (EUA). This EUA will remain in effect (meaning this test can be used) for the duration of the COVID-19 declaration under Section 564(b)(1) of the Act, 21 U.S.C. section 360bbb-3(b)(1), unless the authorization is terminated or revoked.     Resp Syncytial Virus by PCR NEGATIVE NEGATIVE Final    Comment: (NOTE) Fact Sheet for Patients: BloggerCourse.com  Fact Sheet for Healthcare Providers: SeriousBroker.it  This test is not yet approved or cleared by the [place]  FDA and has been authorized for detection and/or diagnosis of SARS-CoV-2 by FDA under an Emergency Use Authorization (EUA). This EUA will remain in effect (meaning this test can be used) for the duration of the COVID-19 declaration under Section 564(b)(1) of the Act, 21 U.S.C. section 360bbb-3(b)(1), unless the authorization is terminated or revoked.  Performed at [facility], [address] [place], [place] [address]   Blood Culture (routine x 2)     Status: None (Preliminary result)   Collection Time: 12/15/23  4:12 PM   Specimen: BLOOD  Result Value Ref Range Status   Specimen Description   Final    BLOOD RIGHT ANTECUBITAL Performed at [facility], [address], [place], [place] [address]    Special Requests   Final    BOTTLES DRAWN AEROBIC AND ANAEROBIC Blood Culture results may not be optimal due to an inadequate volume of blood received in culture bottles Performed at [facility], [address], [place], [place] [address]    Culture  Setup Time   Final    GRAM NEGATIVE RODS IN BOTH AEROBIC AND ANAEROBIC BOTTLES Organism ID to follow CRITICAL RESULT CALLED TO, READ BACK BY AND VERIFIED WITHLatina Pol AT 5284 12/16/23 JG Performed at [facility], [address] [place], [place] [address]    Culture GRAM NEGATIVE RODS  Final   Report Status PENDING  Incomplete  Aerobic/Anaerobic Culture w Gram Stain (surgical/deep wound)     Status: None (Preliminary result)   Collection Time: 12/15/23  4:12 PM   Specimen: Wound  Result Value Ref Range Status   Specimen Description   Final    WOUND Performed at [facility], [address] [place], [place] [address]    Special Requests   Final    SACRAL Performed at [facility], [address], [place], [place] [address]    Gram Stain NO WBC SEEN RARE GRAM NEGATIVE RODS   Final   Culture   Final    TOO YOUNG TO READ Performed at [facility], [address] [place], [place] [address]    Report Status PENDING  Incomplete  Blood Culture ID Panel (Reflexed)     Status: Abnormal   Collection Time: 12/15/23  4:12 PM  Result Value Ref Range Status   Enterococcus faecalis NOT DETECTED NOT DETECTED Final   Enterococcus Faecium NOT DETECTED NOT DETECTED Final   Listeria monocytogenes NOT DETECTED NOT DETECTED Final   Staphylococcus species NOT DETECTED NOT DETECTED Final  Staphylococcus aureus (BCID) NOT DETECTED NOT DETECTED Final   Staphylococcus epidermidis NOT DETECTED NOT DETECTED Final   Staphylococcus lugdunensis NOT DETECTED NOT DETECTED Final   Streptococcus species NOT DETECTED NOT DETECTED Final   Streptococcus agalactiae NOT DETECTED NOT DETECTED Final   Streptococcus pneumoniae NOT DETECTED NOT DETECTED Final   Streptococcus pyogenes NOT DETECTED NOT DETECTED Final   A.calcoaceticus-baumannii NOT DETECTED NOT DETECTED Final   Bacteroides fragilis NOT DETECTED  NOT DETECTED Final   Enterobacterales DETECTED (A) NOT DETECTED Final    Comment: Enterobacterales represent a large order of gram negative bacteria, not a single organism. CRITICAL RESULT CALLED TO, READ BACK BY AND VERIFIED WITH:  JASON ROBINS AT 1610 12/16/23 JG    Enterobacter cloacae complex NOT DETECTED NOT DETECTED Final   Escherichia coli DETECTED (A) NOT DETECTED Final    Comment: CRITICAL RESULT CALLED TO, READ BACK BY AND VERIFIED WITH:  JASON ROBINS AT 0529 12/16/23 JG    Klebsiella aerogenes NOT DETECTED NOT DETECTED Final   Klebsiella oxytoca NOT DETECTED NOT DETECTED Final   Klebsiella pneumoniae NOT DETECTED NOT DETECTED Final   Proteus species NOT DETECTED NOT DETECTED Final   Salmonella species NOT DETECTED NOT DETECTED Final   Serratia marcescens NOT DETECTED NOT DETECTED Final   Haemophilus influenzae NOT DETECTED NOT DETECTED Final   Neisseria meningitidis NOT DETECTED NOT DETECTED Final   Pseudomonas aeruginosa NOT DETECTED NOT DETECTED Final   Stenotrophomonas maltophilia NOT DETECTED NOT DETECTED Final   Candida albicans NOT DETECTED NOT DETECTED Final   Candida auris NOT DETECTED NOT DETECTED Final   Candida glabrata NOT DETECTED NOT DETECTED Final   Candida krusei NOT DETECTED NOT DETECTED Final   Candida parapsilosis NOT DETECTED NOT DETECTED Final   Candida tropicalis NOT DETECTED NOT DETECTED Final   Cryptococcus neoformans/gattii NOT DETECTED NOT DETECTED Final   CTX-M ESBL NOT DETECTED NOT DETECTED Final   Carbapenem resistance IMP NOT DETECTED NOT DETECTED Final   Carbapenem resistance KPC NOT DETECTED NOT DETECTED Final   Carbapenem resistance NDM NOT DETECTED NOT DETECTED Final   Carbapenem resist OXA 48 LIKE NOT DETECTED NOT DETECTED Final   Carbapenem resistance VIM NOT DETECTED NOT DETECTED Final    Comment: Performed at [facility], [address] [place], [place] [address]  Blood Culture (routine x 2)     Status: None (Preliminary  result)   Collection Time: 12/15/23  4:30 PM   Specimen: BLOOD  Result Value Ref Range Status   Specimen Description   Final    BLOOD RIGHT ANTECUBITAL Performed at [facility], [address] [place], [place] [address]    Special Requests   Final    BOTTLES DRAWN AEROBIC AND ANAEROBIC Blood Culture results may not be optimal due to an inadequate volume of blood received in culture bottles Performed at [facility], [address] [place], [place] [address]    Culture  Setup Time   Final    GRAM NEGATIVE RODS IN BOTH AEROBIC AND ANAEROBIC BOTTLES CRITICAL VALUE NOTED.  VALUE IS CONSISTENT WITH PREVIOUSLY REPORTED AND CALLED VALUE. Performed at [facility], [address] [place], [place] [address]    Culture GRAM NEGATIVE RODS  Final   Report Status PENDING  Incomplete    IMAGING: MR ABDOMEN MRCP W WO CONTAST Result Date: 12/16/2023 CLINICAL DATA:  Right upper quadrant pain. Evaluate for biliary disease. EXAM: MRI ABDOMEN WITHOUT AND WITH CONTRAST (INCLUDING MRCP) TECHNIQUE: Multiplanar multisequence MR imaging of  the abdomen was performed both before and after the administration of intravenous contrast. Heavily T2-weighted images of the biliary and pancreatic ducts were obtained, and three-dimensional MRCP images were rendered by post processing. CONTRAST:  7mL GADAVIST GADOBUTROL 1 MMOL/ML IV SOLN COMPARISON:  CT chest, abdomen and pelvis from 12/15/2023 FINDINGS: Note, exam detail is diminished due to motion artifact. Lower chest: Trace bilateral pleural effusions with overlying subpleural consolidation noted in both lung bases. . Hepatobiliary: Within the right lobe of liver there are multifocal areas of mural enhancing T2 hyperintense foci which are favored to represent dilated biliary radicles. The less dilated more central bile ducts within the right lobe have irregular appearance with multiple focal areas of narrowing giving it a string of pearls like  appearance. Status post cholecystectomy. The common bile duct is dilated measuring 2.1 cm proximally with be quite narrowing of the distal 7 mm of the CBD, image 8/14. Debris is noted within the CBD with a few small foci of signal void artifact measuring approximately 3 mm which may reflect small stones versus signal void artifact. Pancreas: No mass, inflammatory changes, or other parenchymal abnormality identified. Spleen:  Within normal limits in size and appearance. Adrenals/Urinary Tract: Normal adrenal glands. Bilateral renal cortical atrophy. No hydronephrosis. Multiple bilateral renal cysts are identified. The largest arises off the upper pole of the left kidney measuring 3.7 x 2.9 cm. Stomach/Bowel: Stomach is normal. No dilated loops of bowel identified within the imaged portions of the abdomen. Vascular/Lymphatic: Normal caliber of the abdominal aorta. The upper abdominal vascularity appears patent. No adenopathy. Other:  No free fluid or fluid collections. Musculoskeletal: Within the right paramidline ventral abdominal wall there is a small subcutaneous nodule measuring 1.5 cm. This is T1 and T2 hyperintense without enhancement and is favored to represent a small hematoma. Possibly due to injection site. Subcutaneous edema is noted along both flanks. No enhancing bone abnormality. IMPRESSION: 1. Exam detail is diminished due to motion artifact. 2. Within the right lobe of liver there are multifocal areas of mural enhancing T2 hyperintense foci which are favored to represent dilated biliary radicles. The less dilated more central bile ducts within the right lobe have irregular appearance with multiple focal areas of narrowing giving it a string of pearls like appearance. In the acute setting imaging findings are concerning for cholangitis. Differential considerations include multiple peripheral liver abscesses. Underlying malignancy is considered less favored. Follow-up imaging advised to ensure resolution  following appropriate therapy. 3. Status post cholecystectomy. Common bile duct dilatation measures up to 2.1 cm. Abrupt focal narrowing of the distal 7 mm of the CBD is favored to represent underlying stricture possibly postinflammatory or secondary to previously passed stone. Debris is noted within the CBD with a few small foci of signal void artifact measuring approximately 3 mm which may reflect small stones versus signal void artifact. 4. Trace bilateral pleural effusions with overlying subpleural consolidation noted in both lung bases. 5. Bilateral renal cortical atrophy. 6. Small subcutaneous nodule within the right paramidline ventral abdominal wall is favored to represent a small hematoma. Possibly due to injection site. Electronically Signed   By: Kimberley Penman M.D.   On: 12/16/2023 04:45   MR 3D Recon At Scanner Result Date: 12/16/2023 CLINICAL DATA:  Right upper quadrant pain. Evaluate for biliary disease. EXAM: MRI ABDOMEN WITHOUT AND WITH CONTRAST (INCLUDING MRCP) TECHNIQUE: Multiplanar multisequence MR imaging of the abdomen was performed both before and after the administration of intravenous contrast. Heavily T2-weighted images of the biliary  and pancreatic ducts were obtained, and three-dimensional MRCP images were rendered by post processing. CONTRAST:  7mL GADAVIST GADOBUTROL 1 MMOL/ML IV SOLN COMPARISON:  CT chest, abdomen and pelvis from 12/15/2023 FINDINGS: Note, exam detail is diminished due to motion artifact. Lower chest: Trace bilateral pleural effusions with overlying subpleural consolidation noted in both lung bases. . Hepatobiliary: Within the right lobe of liver there are multifocal areas of mural enhancing T2 hyperintense foci which are favored to represent dilated biliary radicles. The less dilated more central bile ducts within the right lobe have irregular appearance with multiple focal areas of narrowing giving it a string of pearls like appearance. Status post cholecystectomy.  The common bile duct is dilated measuring 2.1 cm proximally with be quite narrowing of the distal 7 mm of the CBD, image 8/14. Debris is noted within the CBD with a few small foci of signal void artifact measuring approximately 3 mm which may reflect small stones versus signal void artifact. Pancreas: No mass, inflammatory changes, or other parenchymal abnormality identified. Spleen:  Within normal limits in size and appearance. Adrenals/Urinary Tract: Normal adrenal glands. Bilateral renal cortical atrophy. No hydronephrosis. Multiple bilateral renal cysts are identified. The largest arises off the upper pole of the left kidney measuring 3.7 x 2.9 cm. Stomach/Bowel: Stomach is normal. No dilated loops of bowel identified within the imaged portions of the abdomen. Vascular/Lymphatic: Normal caliber of the abdominal aorta. The upper abdominal vascularity appears patent. No adenopathy. Other:  No free fluid or fluid collections. Musculoskeletal: Within the right paramidline ventral abdominal wall there is a small subcutaneous nodule measuring 1.5 cm. This is T1 and T2 hyperintense without enhancement and is favored to represent a small hematoma. Possibly due to injection site. Subcutaneous edema is noted along both flanks. No enhancing bone abnormality. IMPRESSION: 1. Exam detail is diminished due to motion artifact. 2. Within the right lobe of liver there are multifocal areas of mural enhancing T2 hyperintense foci which are favored to represent dilated biliary radicles. The less dilated more central bile ducts within the right lobe have irregular appearance with multiple focal areas of narrowing giving it a string of pearls like appearance. In the acute setting imaging findings are concerning for cholangitis. Differential considerations include multiple peripheral liver abscesses. Underlying malignancy is considered less favored. Follow-up imaging advised to ensure resolution following appropriate therapy. 3. Status  post cholecystectomy. Common bile duct dilatation measures up to 2.1 cm. Abrupt focal narrowing of the distal 7 mm of the CBD is favored to represent underlying stricture possibly postinflammatory or secondary to previously passed stone. Debris is noted within the CBD with a few small foci of signal void artifact measuring approximately 3 mm which may reflect small stones versus signal void artifact. 4. Trace bilateral pleural effusions with overlying subpleural consolidation noted in both lung bases. 5. Bilateral renal cortical atrophy. 6. Small subcutaneous nodule within the right paramidline ventral abdominal wall is favored to represent a small hematoma. Possibly due to injection site. Electronically Signed   By: Kimberley Penman M.D.   On: 12/16/2023 04:45   CT Head Wo Contrast Result Date: 12/15/2023 CLINICAL DATA:  Mental status change, unknown cause EXAM: CT HEAD WITHOUT CONTRAST TECHNIQUE: Contiguous axial images were obtained from the base of the skull through the vertex without intravenous contrast. RADIATION DOSE REDUCTION: This exam was performed according to the departmental dose-optimization program which includes automated exposure control, adjustment of the mA and/or kV according to patient size and/or use of iterative reconstruction technique.  COMPARISON:  Head CT 09/17/2023 FINDINGS: Brain: No intracranial hemorrhage, mass effect, or midline shift. Stable degree of atrophy. No hydrocephalus. The basilar cisterns are patent. Moderate to advanced periventricular and deep white matter hypodensity typical of chronic small vessel ischemia, unchanged. Remote lacunar infarct in the left basal ganglia. Remote left cerebellar infarcts. No evidence of territorial infarct or acute ischemia. No extra-axial or intracranial fluid collection. Vascular: Atherosclerosis of skullbase vasculature without hyperdense vessel or abnormal calcification. Skull: No fracture or focal lesion. Sinuses/Orbits: No acute  finding. Other: None. IMPRESSION: 1. No acute intracranial abnormality. 2. Stable atrophy, chronic small vessel ischemia, and remote infarcts. Electronically Signed   By: Chadwick Colonel M.D.   On: 12/15/2023 18:58   CT CHEST ABDOMEN PELVIS W CONTRAST Result Date: 12/15/2023 CLINICAL DATA:  Sepsis. EXAM: CT CHEST, ABDOMEN, AND PELVIS WITH CONTRAST TECHNIQUE: Multidetector CT imaging of the chest, abdomen and pelvis was performed following the standard protocol during bolus administration of intravenous contrast. RADIATION DOSE REDUCTION: This exam was performed according to the departmental dose-optimization program which includes automated exposure control, adjustment of the mA and/or kV according to patient size and/or use of iterative reconstruction technique. CONTRAST:  OMNIPAQUE  IOHEXOL  300 MG/ML  SOLN COMPARISON:  CT of the abdomen pelvis dated 09/06/2023. FINDINGS: Evaluation of this exam is limited due to respiratory motion. CT CHEST FINDINGS Cardiovascular: There is no cardiomegaly or pericardial effusion. There is coronary vascular calcification and calcification of the mitral annulus. Moderate atherosclerotic calcification of the thoracic aorta. No aneurysmal dilatation or dissection. The central pulmonary arteries are grossly unremarkable. Right IJ catheter with tip in the right atrium. Mediastinum/Nodes: No obvious hilar or mediastinal adenopathy. The esophagus is grossly unremarkable. No mediastinal fluid collection. Lungs/Pleura: There is bibasilar subpleural atelectasis. Pneumonia is less likely but not excluded clinical correlation recommended. No pleural effusion or pneumothorax. The central airways are patent. Musculoskeletal: No acute osseous pathology. CT ABDOMEN PELVIS FINDINGS No intra-abdominal free air or free fluid. Hepatobiliary: There is diffuse biliary duct dilatation. Several small hypodense nodular areas in the liver measure up to 15 mm in the right lobe. These are mild  evaluated with concerning for intrahepatic abscesses versus less likely metastatic disease. Clinical correlation recommended. The common bile duct measures approximately 2 cm in diameter. No radiopaque calculus noted in the central CBD. Pancreas: Unremarkable. No pancreatic ductal dilatation or surrounding inflammatory changes. Spleen: Normal in size without focal abnormality. Adrenals/Urinary Tract: The right adrenal gland is unremarkable. There is a 2 cm left adrenal nodule: Not evaluated on this CT but present on the prior study. This was previously characterized as adenoma on CT of 11/18/2019. Moderate bilateral renal parenchyma atrophy. Small bilateral renal cysts and hypodense lesions which are too small to characterize. There is no hydronephrosis on either side. The urinary bladder is minimally distended. Stomach/Bowel: Percutaneous gastrostomy with balloon in the proximal body of the stomach. There is moderate to large amount of dense stool throughout the colon and within the rectal vault. There is no bowel obstruction or active inflammation. The appendix is normal. Vascular/Lymphatic: Advanced aortoiliac atherosclerotic disease. The IVC is unremarkable. No portal venous gas. There is no adenopathy. Reproductive: Hysterectomy.  No suspicious adnexal masses. Other: Midline anterior pelvic wall incisional scar. Ventral hernia repair mesh. Multiple subcutaneous nodules in the anterior abdominal wall likely related to subcutaneous injections. No fluid collection. Musculoskeletal: Osteopenia with degenerative changes of the spine. Lower lumbar posterior fusion. Severe arthritic changes of the right hip. No acute osseous pathology. IMPRESSION: 1.  Diffuse biliary duct dilatation with several small hypodense nodular areas in the liver concerning for intrahepatic abscesses versus less likely metastatic disease. Further evaluation with MRI/MRCP is recommended. 2. Bibasilar subpleural atelectasis. Pneumonia is less  likely but not excluded. 3. 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 4. Constipation with possible fecal impaction in the rectal vault. No bowel obstruction. Normal appendix. 5.  Aortic Atherosclerosis (ICD10-I70.0). Electronically Signed   By: Angus Bark M.D.   On: 12/15/2023 18:57   DG Chest Port 1 View Result Date: 12/15/2023 CLINICAL DATA:  Question of sepsis to evaluate for abnormality. Dialysis patient. Altered mental status. Fever. EXAM: PORTABLE CHEST 1 VIEW COMPARISON:  09/21/2023 FINDINGS: Right central venous catheter with tip over the right atrium. Postoperative changes in the cervical spine. Shallow inspiration. Heart size and pulmonary vascularity are normal for technique. Lungs are clear. No pleural effusion or pneumothorax. Mediastinal contours appear intact. Degenerative changes in the spine. IMPRESSION: Shallow inspiration.  No evidence of active pulmonary disease. Electronically Signed   By: Boyce Byes M.D.   On: 12/15/2023 17:55    Assessment:   Sherry [name] is a 68 y.o. 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  Recommendations Cont ceftriaxone Broaden with metronidazole. ERCP planned for tomorrow. Cx from sacral wound pending- consider wound care consult   Thank you very much for allowing me to participate in the care of this patient. Please call with questions.   Merri Abbe. Harwood Lingo, MD

## 2023-12-16 NOTE — Progress Notes (Signed)
 Out Patient Arrangements:  Pt receives out-pt HD at [facility] [place] on MWF.   Sherry [name] HPSS [phone]

## 2023-12-17 ENCOUNTER — Inpatient Hospital Stay

## 2023-12-17 ENCOUNTER — Encounter
Admission: EM | Disposition: A | Discharge status: Skilled Nursing Facility | Payer: Self-pay | Source: Home / Self Care | Attending: Internal Medicine

## 2023-12-17 ENCOUNTER — Inpatient Hospital Stay: Admitting: Anesthesiology

## 2023-12-17 ENCOUNTER — Encounter: Payer: Self-pay | Admitting: Internal Medicine

## 2023-12-17 DIAGNOSIS — R6520 Severe sepsis without septic shock: Secondary | ICD-10-CM

## 2023-12-17 DIAGNOSIS — A419 Sepsis, unspecified organism: Secondary | ICD-10-CM | POA: Diagnosis not present

## 2023-12-17 DIAGNOSIS — K8051 Calculus of bile duct without cholangitis or cholecystitis with obstruction: Secondary | ICD-10-CM | POA: Diagnosis not present

## 2023-12-17 HISTORY — PX: BILIARY STENT PLACEMENT: SHX5538

## 2023-12-17 HISTORY — PX: ERCP: SHX5425

## 2023-12-17 HISTORY — PX: STONE EXTRACTION WITH BASKET: SHX5318

## 2023-12-17 LAB — COMPREHENSIVE METABOLIC PANEL WITH GFR
ALT: 44 U/L (ref 0–44)
AST: 137 U/L — ABNORMAL HIGH (ref 15–41)
Albumin: 2 g/dL — ABNORMAL LOW (ref 3.5–5.0)
Alkaline Phosphatase: 829 U/L — ABNORMAL HIGH (ref 38–126)
Anion gap: 11 (ref 5–15)
BUN: 31 mg/dL — ABNORMAL HIGH (ref 8–23)
CO2: 27 mmol/L (ref 22–32)
Calcium: 8.7 mg/dL — ABNORMAL LOW (ref 8.9–10.3)
Chloride: 100 mmol/L (ref 98–111)
Creatinine, Ser: 2.53 mg/dL — ABNORMAL HIGH (ref 0.44–1.00)
GFR, Estimated: 20 mL/min — ABNORMAL LOW (ref >60)
Glucose, Bld: 83 mg/dL (ref 70–99)
Potassium: 3.5 mmol/L (ref 3.5–5.1)
Sodium: 138 mmol/L (ref 135–145)
Total Bilirubin: 1.3 mg/dL — ABNORMAL HIGH (ref 0.0–1.2)
Total Protein: 6.5 g/dL (ref 6.5–8.1)

## 2023-12-17 LAB — CBC
HCT: 24.8 % — ABNORMAL LOW (ref 36.0–46.0)
Hemoglobin: 7.9 g/dL — ABNORMAL LOW (ref 12.0–15.0)
MCH: 28.6 pg (ref 26.0–34.0)
MCHC: 31.9 g/dL (ref 30.0–36.0)
MCV: 89.9 fL (ref 80.0–100.0)
Platelets: 438 10*3/uL — ABNORMAL HIGH (ref 150–400)
RBC: 2.76 MIL/uL — ABNORMAL LOW (ref 3.87–5.11)
RDW: 17.2 % — ABNORMAL HIGH (ref 11.5–15.5)
WBC: 21 10*3/uL — ABNORMAL HIGH (ref 4.0–10.5)
nRBC: 0 % (ref 0.0–0.2)

## 2023-12-17 SURGERY — ERCP, WITH INTERVENTION IF INDICATED
Anesthesia: General

## 2023-12-17 MED ORDER — DICLOFENAC SUPPOSITORY 100 MG
100.0000 mg | Freq: Once | RECTAL | Status: DC
Start: 2023-12-17 — End: 2023-12-21

## 2023-12-17 MED ORDER — PROPOFOL 10 MG/ML IV BOLUS
INTRAVENOUS | Status: DC | PRN
  Administered 2023-12-17: 120 mg via INTRAVENOUS
  Administered 2023-12-17: 30 mg via INTRAVENOUS

## 2023-12-17 MED ORDER — SUGAMMADEX SODIUM 200 MG/2ML IV SOLN
INTRAVENOUS | Status: DC | PRN
  Administered 2023-12-17: 180 mg via INTRAVENOUS

## 2023-12-17 MED ORDER — ROCURONIUM BROMIDE 10 MG/ML (PF) SYRINGE
PREFILLED_SYRINGE | INTRAVENOUS | Status: AC
  Filled 2023-12-17: qty 10

## 2023-12-17 MED ORDER — LACTATED RINGERS IV SOLN: INTRAVENOUS | Status: DC

## 2023-12-17 MED ORDER — ONDANSETRON HCL 4 MG/2ML IJ SOLN
INTRAMUSCULAR | Status: AC
  Filled 2023-12-17: qty 2

## 2023-12-17 MED ORDER — LIDOCAINE HCL (CARDIAC) PF 100 MG/5ML IV SOSY
PREFILLED_SYRINGE | INTRAVENOUS | Status: DC | PRN
  Administered 2023-12-17: 60 mg via INTRAVENOUS

## 2023-12-17 MED ORDER — PROPOFOL 10 MG/ML IV BOLUS
INTRAVENOUS | Status: AC
  Filled 2023-12-17: qty 40

## 2023-12-17 MED ORDER — DEXAMETHASONE SODIUM PHOSPHATE 4 MG/ML IJ SOLN
INTRAMUSCULAR | Status: DC | PRN
Start: 2023-12-17 — End: 2023-12-17
  Administered 2023-12-17: 4 mg via INTRAVENOUS

## 2023-12-17 MED ORDER — LACTATED RINGERS IV SOLN: INTRAVENOUS | Status: DC | PRN

## 2023-12-17 MED ORDER — ONDANSETRON HCL 4 MG/2ML IJ SOLN
INTRAMUSCULAR | Status: DC | PRN
  Administered 2023-12-17: 4 mg via INTRAVENOUS

## 2023-12-17 MED ORDER — ENSURE PLUS HIGH PROTEIN PO LIQD
237.0000 mL | Freq: Two times a day (BID) | ORAL | Status: DC
  Administered 2023-12-17 – 2023-12-22 (×4): 237 mL via ORAL

## 2023-12-17 MED ORDER — DEXAMETHASONE SODIUM PHOSPHATE 10 MG/ML IJ SOLN
INTRAMUSCULAR | Status: AC
  Filled 2023-12-17: qty 1

## 2023-12-17 MED ORDER — ROCURONIUM BROMIDE 100 MG/10ML IV SOLN
INTRAVENOUS | Status: DC | PRN
Start: 2023-12-17 — End: 2023-12-17
  Administered 2023-12-17: 50 mg via INTRAVENOUS

## 2023-12-17 MED ORDER — DICLOFENAC SUPPOSITORY 100 MG
RECTAL | Status: AC
  Filled 2023-12-17: qty 1

## 2023-12-17 MED ORDER — DICLOFENAC SUPPOSITORY 100 MG
RECTAL | Status: DC | PRN
  Administered 2023-12-17: 100 mg via RECTAL

## 2023-12-17 NOTE — Plan of Care (Signed)
  Problem: Clinical Measurements: Goal: Ability to maintain clinical measurements within normal limits will improve Outcome: Progressing Goal: Diagnostic test results will improve Outcome: Progressing Goal: Respiratory complications will improve Outcome: Progressing Goal: Cardiovascular complication will be avoided Outcome: Progressing   Problem: Nutrition: Goal: Adequate nutrition will be maintained Outcome: Progressing   Problem: Coping: Goal: Level of anxiety will decrease Outcome: Progressing   Problem: Elimination: Goal: Will not experience complications related to bowel motility Outcome: Progressing Goal: Will not experience complications related to urinary retention Outcome: Progressing   Problem: Pain Managment: Goal: General experience of comfort will improve and/or be controlled Outcome: Progressing   Problem: Safety: Goal: Ability to remain free from injury will improve Outcome: Progressing

## 2023-12-17 NOTE — Op Note (Signed)
 [facility] Patient Name: Sherry [name] Procedure Date: 12/17/2023 10:41 AM MRN: [id number] Account #: [id number] Date of Birth: 1955-09-28 Admit Type: Inpatient Age: 68 Room: [facility] ENDO ROOM 4 Gender: Female Note Status: Finalized Instrument Name: [id number] Procedure:             ERCP Indications:           Suspected ascending cholangitis Providers:             Marnee Sink MD, MD Referring MD:          Tita Form (Referring MD) Medicines:             General Anesthesia Complications:         No immediate complications. Procedure:             Pre-Anesthesia Assessment:                        - Prior to the procedure, a History and Physical was                         performed, and patient medications and allergies were                         reviewed. The patient's tolerance of previous                         anesthesia was also reviewed. The risks and benefits                         of the procedure and the sedation options and risks                         were discussed with the patient. All questions were                         answered, and informed consent was obtained. Prior                         Anticoagulants: The patient has taken no anticoagulant                         or antiplatelet agents. ASA Grade Assessment: II - A                         patient with mild systemic disease. After reviewing                         the risks and benefits, the patient was deemed in                         satisfactory condition to undergo the procedure.                        After obtaining informed consent, the scope was passed                         under direct vision. Throughout the procedure, the  patient's blood pressure, pulse, and oxygen                         saturations were monitored continuously. The                         Duodenoscope was introduced through the mouth, and                          used to inject contrast into and used to inject                         contrast into the bile duct. The ERCP was accomplished                         without difficulty. The patient tolerated the                         procedure well. Findings:      A scout film of the abdomen was obtained. Surgical clips, consistent       with a previous cholecystectomy, were seen in the area of the right       upper quadrant of the abdomen. The esophagus was successfully intubated       under direct vision. The scope was advanced to a normal major papilla in       the descending duodenum without detailed examination of the pharynx,       larynx and associated structures, and upper GI tract. The upper GI tract       was grossly normal. The bile duct was deeply cannulated with the       short-nosed traction sphincterotome. Contrast was injected. I personally       interpreted the bile duct images. There was brisk flow of contrast       through the ducts. Image quality was excellent. Contrast extended to the       entire biliary tree. The main bile duct contained filling defect(s). The       entire biliary tree contained multiple stenoses. A wire was passed into       the biliary tree. An 8 mm biliary sphincterotomy was made with a       traction (standard) sphincterotome using ERBE electrocautery. There was       no post-sphincterotomy bleeding. The biliary tree was swept with a 15 mm       balloon starting at the bifurcation. Sludge was swept from the duct. All       stones were removed. Nothing was found. One 10 Fr by 9 cm plastic stent       was placed 7.5 cm into the common bile duct. Bile flowed through the       stent. The stent was in good position. Impression:            - A filling defect was seen on the cholangiogram.                        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                        -  Choledocholithiasis was found. Complete removal was                         accomplished by biliary sphincterotomy and balloon                         extraction.                        - A biliary sphincterotomy was performed.                        - The biliary tree was swept and nothing was found.                        - One plastic stent was placed into the common bile                         duct. Recommendation:        - Return patient to hospital ward for ongoing care.                        - Clear liquid diet today.                        - Watch for pancreatitis, bleeding, perforation, and                         cholangitis.                        - Repeat ERCP in 3 months to remove stent. Procedure Code(s):     --- Professional ---                        [phone], Endoscopic retrograde cholangiopancreatography                         (ERCP); with placement of endoscopic stent into                         biliary or pancreatic duct, including pre- and                         post-dilation and guide wire passage, when performed,                         including sphincterotomy, when performed, each stent                        [id number], Endoscopic retrograde cholangiopancreatography                         (ERCP); with removal of calculi/debris from                         biliary/pancreatic duct(s)                        [id number], Endoscopic catheterization of the biliary  ductal system, radiological supervision and                         interpretation Diagnosis Code(s):     --- Professional ---                        K80.51, Calculus of bile duct without cholangitis or                         cholecystitis with obstruction CPT copyright 2022 [employer]. All rights reserved. The codes documented in this report are preliminary and upon coder review may  be revised to meet current compliance requirements. Marnee Sink MD, MD 12/17/2023 11:06:44  AM This report has been signed electronically. Number of Addenda: 0 Note Initiated On: 12/17/2023 10:41 AM Estimated Blood Loss:  Estimated blood loss: none.      [facility]

## 2023-12-17 NOTE — Progress Notes (Signed)
 INFECTIOUS DISEASE PROGRESS NOTE Date of Admission:  12/15/2023     ID: Sherry [name] is a 68 y.o. female with  bacteremia, cholagitis  Principal Problem:   Severe sepsis (HCC) Active Problems:   Hx of bipolar disorder   Essential hypertension   Cigarette smoker   Chronic diastolic CHF (congestive heart failure) (HCC)   Stroke (HCC)   ESRD on dialysis (HCC)   UTI (urinary tract infection)   Liver lesion   Acute metabolic encephalopathy   Sacral wound   Abnormal liver function   Seizure (HCC)   E coli bacteremia   Cholangitis   Calculus of bile duct without cholecystitis with obstruction   Subjective: She has undergone ERCP.  There were stones and strictures.  Stent was placed.  She had a temp of 100.0 yesterday.  White count is stable at 21.  Initial blood culture from June 4 is positive for E. coli.  Wound cultures pending.  ROS  Eleven systems are reviewed and negative except per hpi  Medications:  Antibiotics Given (last 72 hours)     Date/Time Action Medication Dose Rate   12/15/23 1725 New Bag/Given   ceFEPIme (MAXIPIME) 2 g in sodium chloride  0.9 % 100 mL IVPB 2 g 200 mL/hr   12/15/23 1850 New Bag/Given  [Due to IV acsess difficulty.]   metroNIDAZOLE (FLAGYL) IVPB 500 mg 500 mg 100 mL/hr   12/15/23 2004 New Bag/Given   vancomycin  (VANCOCIN ) IVPB 1000 mg/200 mL premix 1,000 mg 200 mL/hr   12/15/23 2359 New Bag/Given   vancomycin  (VANCOREADY) IVPB 500 mg/100 mL 500 mg 100 mL/hr   12/16/23 1610 New Bag/Given   cefTRIAXone (ROCEPHIN) 2 g in sodium chloride  0.9 % 100 mL IVPB 2 g 200 mL/hr   12/16/23 2232 New Bag/Given  [moved due to blood transfusion]   metroNIDAZOLE (FLAGYL) IVPB 500 mg 500 mg 100 mL/hr       aspirin   81 mg Per Tube Daily   Chlorhexidine  Gluconate Cloth  6 each Topical Q0600   cloBAZam  5 mg Per Tube Q12H   diclofenac   100 mg Rectal Once   heparin   5,000 Units Subcutaneous Q8H   lacosamide  200 mg Per Tube BID   levETIRAcetam  500 mg Per Tube  BID   mupirocin ointment  1 Application Topical BID   nicotine   21 mg Transdermal Daily   omeprazole   20 mg Oral Daily   sertraline  25 mg Oral Daily   sevelamer carbonate  800 mg Per Tube TID WC   sodium hypochlorite  1 Application Irrigation Daily    Objective: Vital signs in last 24 hours: Temp:  [96.5 F (35.8 C)-100 F (37.8 C)] 98.7 F (37.1 C) (06/06 1210) Pulse Rate:  [82-102] 93 (06/06 1210) Resp:  [12-30] 19 (06/06 1210) BP: (100-181)/(49-74) 122/68 (06/06 1210) SpO2:  [97 %-100 %] 97 % (06/06 1210) Weight:  [62.3 kg] 62.3 kg (06/06 0500) Constitutional: chroniclaly ill appearing, lying in bed eating HENT: [place]/AT, PERRLA, no scleral icterus Mouth/Throat: Oropharynx is dry Cardiovascular: Normal rate, regular rhythm Pulmonary/Chest: Effort normal and breath sounds normal. No respiratory distress.  has no wheezes.  Neck = supple, no nuchal rigidity Abdominal: Soft. Mild distention Lymphadenopathy: no cervical adenopathy. No axillary adenopathy Neurological: alert but slowed mentation  HD cath R upper chest Skin: sacreal decub as below  Lab Results Recent Labs    12/16/23 0532 12/17/23 0610  WBC 21.3* 21.0*  HGB 6.7* 7.9*  HCT 22.3* 24.8*  NA 139  138  K 3.6 3.5  CL 102 100  CO2 27 27  BUN 53* 31*  CREATININE 4.13* 2.53*    Microbiology: Results for orders placed or performed during the hospital encounter of 12/15/23  Resp panel by RT-PCR (RSV, Flu A&B, Covid) Anterior Nasal Swab     Status: None   Collection Time: 12/15/23  4:11 PM   Specimen: Anterior Nasal Swab  Result Value Ref Range Status   SARS Coronavirus 2 by RT PCR NEGATIVE NEGATIVE Final    Comment: (NOTE) SARS-CoV-2 target nucleic acids are NOT DETECTED.  The SARS-CoV-2 RNA is generally detectable in upper respiratory specimens during the acute phase of infection. The lowest concentration of SARS-CoV-2 viral copies this assay can detect is 138 copies/mL. A negative result does not preclude  SARS-Cov-2 infection and should not be used as the sole basis for treatment or other patient management decisions. A negative result may occur with  improper specimen collection/handling, submission of specimen other than nasopharyngeal swab, presence of viral mutation(s) within the areas targeted by this assay, and inadequate number of viral copies(<138 copies/mL). A negative result must be combined with clinical observations, patient history, and epidemiological information. The expected result is Negative.  Fact Sheet for Patients:  BloggerCourse.com  Fact Sheet for Healthcare Providers:  SeriousBroker.it  This test is no t yet approved or cleared by the [place]  FDA and  has been authorized for detection and/or diagnosis of SARS-CoV-2 by FDA under an Emergency Use Authorization (EUA). This EUA will remain  in effect (meaning this test can be used) for the duration of the COVID-19 declaration under Section 564(b)(1) of the Act, 21 U.S.C.section 360bbb-3(b)(1), unless the authorization is terminated  or revoked sooner.       Influenza A by PCR NEGATIVE NEGATIVE Final   Influenza B by PCR NEGATIVE NEGATIVE Final    Comment: (NOTE) The Xpert Xpress SARS-CoV-2/FLU/RSV plus assay is intended as an aid in the diagnosis of influenza from Nasopharyngeal swab specimens and should not be used as a sole basis for treatment. Nasal washings and aspirates are unacceptable for Xpert Xpress SARS-CoV-2/FLU/RSV testing.  Fact Sheet for Patients: BloggerCourse.com  Fact Sheet for Healthcare Providers: SeriousBroker.it  This test is not yet approved or cleared by the [place]  FDA and has been authorized for detection and/or diagnosis of SARS-CoV-2 by FDA under an Emergency Use Authorization (EUA). This EUA will remain in effect (meaning this test can be used) for the duration of  the COVID-19 declaration under Section 564(b)(1) of the Act, 21 U.S.C. section 360bbb-3(b)(1), unless the authorization is terminated or revoked.     Resp Syncytial Virus by PCR NEGATIVE NEGATIVE Final    Comment: (NOTE) Fact Sheet for Patients: BloggerCourse.com  Fact Sheet for Healthcare Providers: SeriousBroker.it  This test is not yet approved or cleared by the [place]  FDA and has been authorized for detection and/or diagnosis of SARS-CoV-2 by FDA under an Emergency Use Authorization (EUA). This EUA will remain in effect (meaning this test can be used) for the duration of the COVID-19 declaration under Section 564(b)(1) of the Act, 21 U.S.C. section 360bbb-3(b)(1), unless the authorization is terminated or revoked.  Performed at [facility], [address] [place], [place] [address]   Blood Culture (routine x 2)     Status: Abnormal (Preliminary result)   Collection Time: 12/15/23  4:12 PM   Specimen: BLOOD  Result Value Ref Range Status   Specimen Description   Final    BLOOD  RIGHT ANTECUBITAL Performed at [facility], [address] [place], [place] [address]    Special Requests   Final    BOTTLES DRAWN AEROBIC AND ANAEROBIC Blood Culture results may not be optimal due to an inadequate volume of blood received in culture bottles Performed at [facility], [address] [place], [place] [address]    Culture  Setup Time   Final    GRAM NEGATIVE RODS IN BOTH AEROBIC AND ANAEROBIC BOTTLES Organism ID to follow CRITICAL RESULT CALLED TO, READ BACK BY AND VERIFIED WITH:  JASON ROBINS AT 5284 12/16/23 JG Performed at [facility], [address] [place], [place] [address]    Culture (A)  Final    ESCHERICHIA COLI SUSCEPTIBILITIES TO FOLLOW Performed at [facility], [address] [place], [place] [address]    Report Status PENDING  Incomplete  Aerobic/Anaerobic  Culture w Gram Stain (surgical/deep wound)     Status: None (Preliminary result)   Collection Time: 12/15/23  4:12 PM   Specimen: Wound  Result Value Ref Range Status   Specimen Description   Final    WOUND Performed at [facility], [address] [place], [place] [address]    Special Requests   Final    SACRAL Performed at [facility], [address] [place], [place] [address]    Gram Stain NO WBC SEEN RARE GRAM NEGATIVE RODS   Final   Culture   Final    ABUNDANT GRAM NEGATIVE RODS FEW GRAM NEGATIVE RODS SUSCEPTIBILITIES TO FOLLOW CULTURE REINCUBATED FOR BETTER GROWTH Performed at [facility], [address], [place], [place] [address]    Report Status PENDING  Incomplete  Blood Culture ID Panel (Reflexed)     Status: Abnormal   Collection Time: 12/15/23  4:12 PM  Result Value Ref Range Status   Enterococcus faecalis NOT DETECTED NOT DETECTED Final   Enterococcus Faecium NOT DETECTED NOT DETECTED Final   Listeria monocytogenes NOT DETECTED NOT DETECTED Final   Staphylococcus species NOT DETECTED NOT DETECTED Final   Staphylococcus aureus (BCID) NOT DETECTED NOT DETECTED Final   Staphylococcus epidermidis NOT DETECTED NOT DETECTED Final   Staphylococcus lugdunensis NOT DETECTED NOT DETECTED Final   Streptococcus species NOT DETECTED NOT DETECTED Final   Streptococcus agalactiae NOT DETECTED NOT DETECTED Final   Streptococcus pneumoniae NOT DETECTED NOT DETECTED Final   Streptococcus pyogenes NOT DETECTED NOT DETECTED Final   A.calcoaceticus-baumannii NOT DETECTED NOT DETECTED Final   Bacteroides fragilis NOT DETECTED NOT DETECTED Final   Enterobacterales DETECTED (A) NOT DETECTED Final    Comment: Enterobacterales represent a large order of gram negative bacteria, not a single organism. CRITICAL RESULT CALLED TO, READ BACK BY AND VERIFIED WITH:  JASON ROBINS AT 0529 12/16/23 JG    Enterobacter cloacae complex NOT DETECTED NOT DETECTED Final    Escherichia coli DETECTED (A) NOT DETECTED Final    Comment: CRITICAL RESULT CALLED TO, READ BACK BY AND VERIFIED WITH:  JASON ROBINS AT 0529 12/16/23 JG    Klebsiella aerogenes NOT DETECTED NOT DETECTED Final   Klebsiella oxytoca NOT DETECTED NOT DETECTED Final   Klebsiella pneumoniae NOT DETECTED NOT DETECTED Final   Proteus species NOT DETECTED NOT DETECTED Final   Salmonella species NOT DETECTED NOT DETECTED Final   Serratia marcescens NOT DETECTED NOT DETECTED Final   Haemophilus influenzae NOT DETECTED NOT DETECTED Final   Neisseria meningitidis NOT DETECTED NOT DETECTED Final   Pseudomonas aeruginosa NOT DETECTED  NOT DETECTED Final   Stenotrophomonas maltophilia NOT DETECTED NOT DETECTED Final   Candida albicans NOT DETECTED NOT DETECTED Final   Candida auris NOT DETECTED NOT DETECTED Final   Candida glabrata NOT DETECTED NOT DETECTED Final   Candida krusei NOT DETECTED NOT DETECTED Final   Candida parapsilosis NOT DETECTED NOT DETECTED Final   Candida tropicalis NOT DETECTED NOT DETECTED Final   Cryptococcus neoformans/gattii NOT DETECTED NOT DETECTED Final   CTX-M ESBL NOT DETECTED NOT DETECTED Final   Carbapenem resistance IMP NOT DETECTED NOT DETECTED Final   Carbapenem resistance KPC NOT DETECTED NOT DETECTED Final   Carbapenem resistance NDM NOT DETECTED NOT DETECTED Final   Carbapenem resist OXA 48 LIKE NOT DETECTED NOT DETECTED Final   Carbapenem resistance VIM NOT DETECTED NOT DETECTED Final    Comment: Performed at [facility], [address] [place], [place] [address]  Blood Culture (routine x 2)     Status: None (Preliminary result)   Collection Time: 12/15/23  4:30 PM   Specimen: BLOOD  Result Value Ref Range Status   Specimen Description   Final    BLOOD RIGHT ANTECUBITAL Performed at [facility], [address] [place], [place] [address]    Special Requests   Final    BOTTLES DRAWN AEROBIC AND ANAEROBIC Blood Culture results may not be  optimal due to an inadequate volume of blood received in culture bottles Performed at [facility], [address] [place], [place] [address]    Culture  Setup Time   Final    GRAM NEGATIVE RODS IN BOTH AEROBIC AND ANAEROBIC BOTTLES CRITICAL VALUE NOTED.  VALUE IS CONSISTENT WITH PREVIOUSLY REPORTED AND CALLED VALUE. Performed at [facility], [address], [place], [place] [address]    Culture   Final    Hillis Lu NEGATIVE RODS IDENTIFICATION TO FOLLOW Performed at [facility], [address] [place], [place] [address]    Report Status PENDING  Incomplete  Urine Culture (for pregnant, neutropenic or urologic patients or patients with an indwelling urinary catheter)     Status: None   Collection Time: 12/15/23  5:40 PM   Specimen: Urine, Clean Catch  Result Value Ref Range Status   Specimen Description   Final    URINE, CLEAN CATCH Performed at [facility], [address], [place], [place] [address]    Special Requests   Final    NONE Performed at [facility], [address] [place], [place] [address]    Culture   Final    NO GROWTH Performed at [facility], [address] [place], [place] [address]    Report Status 12/16/2023 FINAL  Final  MRSA Next Gen by PCR, Nasal     Status: None   Collection Time: 12/16/23  5:28 PM   Specimen: Nasal Mucosa; Nasal Swab  Result Value Ref Range Status   MRSA by PCR Next Gen NOT DETECTED NOT DETECTED Final    Comment: (NOTE) The GeneXpert MRSA Assay (FDA approved for NASAL specimens only), is one component of a comprehensive MRSA colonization surveillance program. It is not intended to diagnose MRSA infection nor to guide or monitor treatment for MRSA infections. Test performance is not FDA approved in patients less than 109 years old. Performed at [facility], [address], [place], [place] [address]     Studies/Results: DG C-Arm 1-60 Min-No Report Result Date:  12/17/2023 Fluoroscopy was utilized by the requesting physician.  No radiographic interpretation.   MR ABDOMEN MRCP W WO CONTAST Result Date: 12/16/2023 CLINICAL DATA:  Right upper quadrant pain. Evaluate for biliary disease. EXAM: MRI ABDOMEN WITHOUT AND WITH CONTRAST (INCLUDING MRCP) TECHNIQUE: Multiplanar multisequence MR imaging of the abdomen was performed both before and after the administration of intravenous contrast. Heavily T2-weighted images of the biliary and pancreatic ducts were obtained, and three-dimensional MRCP images were rendered by post processing. CONTRAST:  7mL GADAVIST GADOBUTROL 1 MMOL/ML IV SOLN COMPARISON:  CT chest, abdomen and pelvis from 12/15/2023 FINDINGS: Note, exam detail is diminished due to motion artifact. Lower chest: Trace bilateral pleural effusions with overlying subpleural consolidation noted in both lung bases. . Hepatobiliary: Within the right lobe of liver there are multifocal areas of mural enhancing T2 hyperintense foci which are favored to represent dilated biliary radicles. The less dilated more central bile ducts within the right lobe have irregular appearance with multiple focal areas of narrowing giving it a string of pearls like appearance. Status post cholecystectomy. The common bile duct is dilated measuring 2.1 cm proximally with be quite narrowing of the distal 7 mm of the CBD, image 8/14. Debris is noted within the CBD with a few small foci of signal void artifact measuring approximately 3 mm which may reflect small stones versus signal void artifact. Pancreas: No mass, inflammatory changes, or other parenchymal abnormality identified. Spleen:  Within normal limits in size and appearance. Adrenals/Urinary Tract: Normal adrenal glands. Bilateral renal cortical atrophy. No hydronephrosis. Multiple bilateral renal cysts are identified. The largest arises off the upper pole of the left kidney measuring 3.7 x 2.9 cm. Stomach/Bowel: Stomach is normal. No dilated  loops of bowel identified within the imaged portions of the abdomen. Vascular/Lymphatic: Normal caliber of the abdominal aorta. The upper abdominal vascularity appears patent. No adenopathy. Other:  No free fluid or fluid collections. Musculoskeletal: Within the right paramidline ventral abdominal wall there is a small subcutaneous nodule measuring 1.5 cm. This is T1 and T2 hyperintense without enhancement and is favored to represent a small hematoma. Possibly due to injection site. Subcutaneous edema is noted along both flanks. No enhancing bone abnormality. IMPRESSION: 1. Exam detail is diminished due to motion artifact. 2. Within the right lobe of liver there are multifocal areas of mural enhancing T2 hyperintense foci which are favored to represent dilated biliary radicles. The less dilated more central bile ducts within the right lobe have irregular appearance with multiple focal areas of narrowing giving it a string of pearls like appearance. In the acute setting imaging findings are concerning for cholangitis. Differential considerations include multiple peripheral liver abscesses. Underlying malignancy is considered less favored. Follow-up imaging advised to ensure resolution following appropriate therapy. 3. Status post cholecystectomy. Common bile duct dilatation measures up to 2.1 cm. Abrupt focal narrowing of the distal 7 mm of the CBD is favored to represent underlying stricture possibly postinflammatory or secondary to previously passed stone. Debris is noted within the CBD with a few small foci of signal void artifact measuring approximately 3 mm which may reflect small stones versus signal void artifact. 4. Trace bilateral pleural effusions with overlying subpleural consolidation noted in both lung bases. 5. Bilateral renal cortical atrophy. 6. Small subcutaneous nodule within the right paramidline ventral abdominal wall is favored to represent a small hematoma. Possibly due to injection site.  Electronically Signed   By: Kimberley Penman M.D.   On: 12/16/2023 04:45   MR 3D Recon At Scanner Result Date: 12/16/2023 CLINICAL DATA:  Right upper  quadrant pain. Evaluate for biliary disease. EXAM: MRI ABDOMEN WITHOUT AND WITH CONTRAST (INCLUDING MRCP) TECHNIQUE: Multiplanar multisequence MR imaging of the abdomen was performed both before and after the administration of intravenous contrast. Heavily T2-weighted images of the biliary and pancreatic ducts were obtained, and three-dimensional MRCP images were rendered by post processing. CONTRAST:  7mL GADAVIST GADOBUTROL 1 MMOL/ML IV SOLN COMPARISON:  CT chest, abdomen and pelvis from 12/15/2023 FINDINGS: Note, exam detail is diminished due to motion artifact. Lower chest: Trace bilateral pleural effusions with overlying subpleural consolidation noted in both lung bases. . Hepatobiliary: Within the right lobe of liver there are multifocal areas of mural enhancing T2 hyperintense foci which are favored to represent dilated biliary radicles. The less dilated more central bile ducts within the right lobe have irregular appearance with multiple focal areas of narrowing giving it a string of pearls like appearance. Status post cholecystectomy. The common bile duct is dilated measuring 2.1 cm proximally with be quite narrowing of the distal 7 mm of the CBD, image 8/14. Debris is noted within the CBD with a few small foci of signal void artifact measuring approximately 3 mm which may reflect small stones versus signal void artifact. Pancreas: No mass, inflammatory changes, or other parenchymal abnormality identified. Spleen:  Within normal limits in size and appearance. Adrenals/Urinary Tract: Normal adrenal glands. Bilateral renal cortical atrophy. No hydronephrosis. Multiple bilateral renal cysts are identified. The largest arises off the upper pole of the left kidney measuring 3.7 x 2.9 cm. Stomach/Bowel: Stomach is normal. No dilated loops of bowel identified within  the imaged portions of the abdomen. Vascular/Lymphatic: Normal caliber of the abdominal aorta. The upper abdominal vascularity appears patent. No adenopathy. Other:  No free fluid or fluid collections. Musculoskeletal: Within the right paramidline ventral abdominal wall there is a small subcutaneous nodule measuring 1.5 cm. This is T1 and T2 hyperintense without enhancement and is favored to represent a small hematoma. Possibly due to injection site. Subcutaneous edema is noted along both flanks. No enhancing bone abnormality. IMPRESSION: 1. Exam detail is diminished due to motion artifact. 2. Within the right lobe of liver there are multifocal areas of mural enhancing T2 hyperintense foci which are favored to represent dilated biliary radicles. The less dilated more central bile ducts within the right lobe have irregular appearance with multiple focal areas of narrowing giving it a string of pearls like appearance. In the acute setting imaging findings are concerning for cholangitis. Differential considerations include multiple peripheral liver abscesses. Underlying malignancy is considered less favored. Follow-up imaging advised to ensure resolution following appropriate therapy. 3. Status post cholecystectomy. Common bile duct dilatation measures up to 2.1 cm. Abrupt focal narrowing of the distal 7 mm of the CBD is favored to represent underlying stricture possibly postinflammatory or secondary to previously passed stone. Debris is noted within the CBD with a few small foci of signal void artifact measuring approximately 3 mm which may reflect small stones versus signal void artifact. 4. Trace bilateral pleural effusions with overlying subpleural consolidation noted in both lung bases. 5. Bilateral renal cortical atrophy. 6. Small subcutaneous nodule within the right paramidline ventral abdominal wall is favored to represent a small hematoma. Possibly due to injection site. Electronically Signed   By: Kimberley Penman M.D.   On: 12/16/2023 04:45   CT Head Wo Contrast Result Date: 12/15/2023 CLINICAL DATA:  Mental status change, unknown cause EXAM: CT HEAD WITHOUT CONTRAST TECHNIQUE: Contiguous axial images were obtained from the base of the skull  through the vertex without intravenous contrast. RADIATION DOSE REDUCTION: This exam was performed according to the departmental dose-optimization program which includes automated exposure control, adjustment of the mA and/or kV according to patient size and/or use of iterative reconstruction technique. COMPARISON:  Head CT 09/17/2023 FINDINGS: Brain: No intracranial hemorrhage, mass effect, or midline shift. Stable degree of atrophy. No hydrocephalus. The basilar cisterns are patent. Moderate to advanced periventricular and deep white matter hypodensity typical of chronic small vessel ischemia, unchanged. Remote lacunar infarct in the left basal ganglia. Remote left cerebellar infarcts. No evidence of territorial infarct or acute ischemia. No extra-axial or intracranial fluid collection. Vascular: Atherosclerosis of skullbase vasculature without hyperdense vessel or abnormal calcification. Skull: No fracture or focal lesion. Sinuses/Orbits: No acute finding. Other: None. IMPRESSION: 1. No acute intracranial abnormality. 2. Stable atrophy, chronic small vessel ischemia, and remote infarcts. Electronically Signed   By: Chadwick Colonel M.D.   On: 12/15/2023 18:58   CT CHEST ABDOMEN PELVIS W CONTRAST Result Date: 12/15/2023 CLINICAL DATA:  Sepsis. EXAM: CT CHEST, ABDOMEN, AND PELVIS WITH CONTRAST TECHNIQUE: Multidetector CT imaging of the chest, abdomen and pelvis was performed following the standard protocol during bolus administration of intravenous contrast. RADIATION DOSE REDUCTION: This exam was performed according to the departmental dose-optimization program which includes automated exposure control, adjustment of the mA and/or kV according to patient size and/or use of  iterative reconstruction technique. CONTRAST:  100mL OMNIPAQUE  IOHEXOL  300 MG/ML  SOLN COMPARISON:  CT of the abdomen pelvis dated 09/06/2023. FINDINGS: Evaluation of this exam is limited due to respiratory motion. CT CHEST FINDINGS Cardiovascular: There is no cardiomegaly or pericardial effusion. There is coronary vascular calcification and calcification of the mitral annulus. Moderate atherosclerotic calcification of the thoracic aorta. No aneurysmal dilatation or dissection. The central pulmonary arteries are grossly unremarkable. Right IJ catheter with tip in the right atrium. Mediastinum/Nodes: No obvious hilar or mediastinal adenopathy. The esophagus is grossly unremarkable. No mediastinal fluid collection. Lungs/Pleura: There is bibasilar subpleural atelectasis. Pneumonia is less likely but not excluded clinical correlation recommended. No pleural effusion or pneumothorax. The central airways are patent. Musculoskeletal: No acute osseous pathology. CT ABDOMEN PELVIS FINDINGS No intra-abdominal free air or free fluid. Hepatobiliary: There is diffuse biliary duct dilatation. Several small hypodense nodular areas in the liver measure up to 15 mm in the right lobe. These are mild evaluated with concerning for intrahepatic abscesses versus less likely metastatic disease. Clinical correlation recommended. The common bile duct measures approximately 2 cm in diameter. No radiopaque calculus noted in the central CBD. Pancreas: Unremarkable. No pancreatic ductal dilatation or surrounding inflammatory changes. Spleen: Normal in size without focal abnormality. Adrenals/Urinary Tract: The right adrenal gland is unremarkable. There is a 2 cm left adrenal nodule: Not evaluated on this CT but present on the prior study. This was previously characterized as adenoma on CT of 11/18/2019. Moderate bilateral renal parenchyma atrophy. Small bilateral renal cysts and hypodense lesions which are too small to characterize. There is  no hydronephrosis on either side. The urinary bladder is minimally distended. Stomach/Bowel: Percutaneous gastrostomy with balloon in the proximal body of the stomach. There is moderate to large amount of dense stool throughout the colon and within the rectal vault. There is no bowel obstruction or active inflammation. The appendix is normal. Vascular/Lymphatic: Advanced aortoiliac atherosclerotic disease. The IVC is unremarkable. No portal venous gas. There is no adenopathy. Reproductive: Hysterectomy.  No suspicious adnexal masses. Other: Midline anterior pelvic wall incisional scar. Ventral hernia repair mesh. Multiple  subcutaneous nodules in the anterior abdominal wall likely related to subcutaneous injections. No fluid collection. Musculoskeletal: Osteopenia with degenerative changes of the spine. Lower lumbar posterior fusion. Severe arthritic changes of the right hip. No acute osseous pathology. IMPRESSION: 1. Diffuse biliary duct dilatation with several small hypodense nodular areas in the liver concerning for intrahepatic abscesses versus less likely metastatic disease. Further evaluation with MRI/MRCP is recommended. 2. Bibasilar subpleural atelectasis. Pneumonia is less likely but not excluded. 3. 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 4. Constipation with possible fecal impaction in the rectal vault. No bowel obstruction. Normal appendix. 5.  Aortic Atherosclerosis (ICD10-I70.0). Electronically Signed   By: Angus Bark M.D.   On: 12/15/2023 18:57   DG Chest Port 1 View Result Date: 12/15/2023 CLINICAL DATA:  Question of sepsis to evaluate for abnormality. Dialysis patient. Altered mental status. Fever. EXAM: PORTABLE CHEST 1 VIEW COMPARISON:  09/21/2023 FINDINGS: Right central venous catheter with tip over the right atrium. Postoperative changes in the cervical spine. Shallow inspiration. Heart size and pulmonary vascularity are normal for technique. Lungs are  clear. No pleural effusion or pneumothorax. Mediastinal contours appear intact. Degenerative changes in the spine. IMPRESSION: Shallow inspiration.  No evidence of active pulmonary disease. Electronically Signed   By: Boyce Byes M.D.   On: 12/15/2023 17:55    Assessment/Plan: Lenox Ladouceur is a 68 y.o. 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 Also has decub ulcer which has been present for several months. She has been ill for some time and was at [facility] prior to being dced to SNF  6/6- s/p ERCP, wbc 21  Recommendations Cont ceftriaxone and metronidazole. Cx from sacral wound pending- consider wound care consult. Family tells me she was dced from [facility] and was supposed to have a wound vac at SNF but not set up. May need surgery eval  Thank you very much for the consult. Will follow with you.  Eartha Gold   12/17/2023, 1:30 PM

## 2023-12-17 NOTE — Plan of Care (Signed)

## 2023-12-17 NOTE — Anesthesia Procedure Notes (Signed)
 Procedure Name: Intubation Date/Time: 12/17/2023 10:27 AM  Performed by: Philippe Brazen, CRNAPre-anesthesia Checklist: Patient identified, Emergency Drugs available, Suction available and Patient being monitored Patient Re-evaluated:Patient Re-evaluated prior to induction Oxygen Delivery Method: Circle system utilized Preoxygenation: Pre-oxygenation with 100% oxygen Induction Type: IV induction Ventilation: Mask ventilation without difficulty Laryngoscope Size: McGrath and 3 Grade View: Grade III Tube type: Oral Tube size: 7.0 mm Number of attempts: 1 Airway Equipment and Method: Stylet and Oral airway Placement Confirmation: ETT inserted through vocal cords under direct vision, positive ETCO2 and breath sounds checked- equal and bilateral Secured at: 23 cm Tube secured with: Tape Dental Injury: Teeth and Oropharynx as per pre-operative assessment

## 2023-12-17 NOTE — Progress Notes (Addendum)
 Progress Note    Lanaya Bennis  BJY:[id number] DOB: July 12, 1956  DOA: 12/15/2023 PCP: Tita Form, MD      Brief Narrative:    Medical records reviewed and are as summarized below:  Kiri Hinderliter is a 68 y.o. female with medical history significant for ESRD on HD (on MWF schedule), seizure, chronic biliary duct dilation in the setting of a cholecystectomy which is felt to be physiological and normal per GI consult (this is documented by Dr. Gordon Latus on 02/24/22), chronic hepatitis C, opioid use disorder, stroke, s/p of gastrostomy, hypertension, diastolic CHF, stroke, depression with anxiety, bipolar, anemia,HCV, breast cancer (s/p right mastectomy), polypharmacy.  She was previously hospitalized at [facility] in February 2025 for E. coli pneumonia and bacteremia, cerebellar stroke and tonic-clonic seizures in the setting of severe PRES that required intubation and subsequent tracheostomy and PEG tube placement.  She presented to the hospital because of fever and altered mental status.  Reportedly, she was noted to be confused and she spiked a temperature with fever of 102.4 F on the day of admission.   ED Course: pt was found to have WBC 18.2, lactic acid of 1.4 --> 1.1, potassium 3.6, positive UA (turbid appearance, large amount of leukocyte, many bacteria, WBC > 50, squamous cell 11-20), negative PCR for COVID, flu and RSV, ammonia level<13, troponin 25 --> 22, abnormal liver function (ALP 1035, AST 214, ALT 67, total bilirubin 0.9). Blood pressure 150/72 --> 119/58, heart rate 95, RR 21, oxygen saturation 94% on room air.  Chest x-ray negative.  CT of head negative.  Patient is admitted to PCU as inpatient.  Dr. Zelda Hickman of renal is consulted.   CT of abdomen/pelvis/chest: 1. Diffuse biliary duct dilatation with several small hypodense nodular areas in the liver concerning for intrahepatic abscesses versus less likely metastatic disease. Further evaluation with MRI/MRCP is recommended. 2.  Bibasilar subpleural atelectasis. Pneumonia is less likely but not excluded. 3. 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 4. Constipation with possible fecal impaction in the rectal vault. No bowel obstruction. Normal appendix. 5.  Aortic Atherosclerosis (ICD10-I70.0).   MRCP:    IMPRESSION: 1. Exam detail is diminished due to motion artifact. 2. Within the right lobe of liver there are multifocal areas of mural enhancing T2 hyperintense foci which are favored to represent dilated biliary radicles. The less dilated more central bile ducts within the right lobe have irregular appearance with multiple focal areas of narrowing giving it a string of pearls like appearance. In the acute setting imaging findings are concerning for cholangitis. Differential considerations include multiple peripheral liver abscesses. Underlying malignancy is considered less favored. Follow-up imaging advised to ensure resolution following appropriate therapy. 3. Status post cholecystectomy. Common bile duct dilatation measures up to 2.1 cm. Abrupt focal narrowing of the distal 7 mm of the CBD is favored to represent underlying stricture possibly postinflammatory or secondary to previously passed stone. Debris is noted within the CBD with a few small foci of signal void artifact measuring approximately 3 mm which may reflect small stones versus signal void artifact. 4. Trace bilateral pleural effusions with overlying subpleural consolidation noted in both lung bases. 5. Bilateral renal cortical atrophy. 6. Small subcutaneous nodule within the right paramidline ventral abdominal wall is favored to represent a small hematoma. Possibly due to injection site.    She was admitted to the hospital for severe sepsis from E. coli bacteremia, probable acute UTI and suspected acute cholangitis.  Assessment/Plan:   Principal Problem:   Severe sepsis (HCC) Active Problems:    Liver lesion   Abnormal liver function   UTI (urinary tract infection)   Acute metabolic encephalopathy   ESRD on dialysis [facility])   Essential hypertension   Chronic diastolic CHF (congestive heart failure) (HCC)   Stroke (HCC)   Seizure (HCC)   Cigarette smoker   Hx of bipolar disorder   Sacral wound   E coli bacteremia   Cholangitis    Body mass index is 25.12 kg/m.   Severe sepsis, E. coli bacteremia, probable acute UTI, probable acute cholangitis, leukocytosis:  4 out of 4 blood cultures positive for E. coli.   No growth on urine culture Sacral wound culture growing abundant gram-negative rods. Continue IV ceftriaxone and follow-up blood culture sensitivity report. Follow-up with ID specialist for further recommendations.   Chronic biliary dilatation from previous cholecystectomy, suspected acute cholangitis, suspected multiple peripheral liver abscesses on MRCP, chronically elevated liver enzymes, chronic hepatitis C:  S/p ERCP on 12/2023.  "A filling defect was seen on the cholangiogram.  Multiple biliary strictures were found in the entire biliary tree.  Strictures were inflammatory.  Choledocholithiasis was found.  Complete removal was accomplished by biliary sphincterotomy and balloon extraction.  A biliary sphincterectomy was performed.  The biliary tree was swept and nothing was found.  1 plastic stent was placed into the common bile duct".   Acute metabolic encephalopathy: Improved.  Likely due to infectious process.  CT head did not show any acute stroke.   Acute on chronic anemia: Hemoglobin up from 6.7-7.9. S/p transfusion of 1 unit of PRBCs on 12/16/2023.   Monitor H&H and transfuse as needed.   ESRD on TTS schedule: Follow-up with nephrologist.  Chronic diastolic CHF: Compensated   Seizure disorder: Continue clobazam, Vimpat and Keppra.   Sacral decubitus ulcer (stage III versus stage IV): Present on admission.  According to [facility], patient's niece, wound  was contaminated with fecal material about a week prior to admission.   History of stroke, dysphagia with gastrostomy tube in place   Plan discussed with Avis Lemming, niece, over the phone.  I asked her the type food that patient typically eats.  She told me that patient is on a pured diet.  However, at [facility] nursing home, patient's sister had gotten her food from [facility] which she was able to eat without any "reaction". I was unable to reach [name], patient's sister, via phone.    Diet Order             Diet NPO time specified  Diet effective midnight                            Consultants: ID specialist Nephrologist Gastroenterologist  Procedures: ERCP 12/17/2023    Medications:    [MAR Hold] aspirin   81 mg Per Tube Daily   [MAR Hold] Chlorhexidine  Gluconate Cloth  6 each Topical Q0600   [MAR Hold] cloBAZam  5 mg Per Tube Q12H   diclofenac   100 mg Rectal Once   [MAR Hold] heparin   5,000 Units Subcutaneous Q8H   [MAR Hold] lacosamide  200 mg Per Tube BID   [MAR Hold] levETIRAcetam  500 mg Per Tube BID   [MAR Hold] mupirocin ointment  1 Application Topical BID   [MAR Hold] nicotine   21 mg Transdermal Daily   [MAR Hold] omeprazole   20 mg Oral Daily   [MAR Hold] sertraline  25  mg Oral Daily   [MAR Hold] sevelamer carbonate  800 mg Per Tube TID WC   [MAR Hold] sodium hypochlorite  1 Application Irrigation Daily   Continuous Infusions:  [MAR Hold] cefTRIAXone (ROCEPHIN)  IV Stopped (12/16/23 0753)   lactated ringers  40 mL/hr at 12/17/23 0952   [MAR Hold] metronidazole Stopped (12/16/23 2340)     Anti-infectives (From admission, onward)    Start     Dose/Rate Route Frequency Ordered Stop   12/16/23 2200  ceFEPIme (MAXIPIME) 1 g in sodium chloride  0.9 % 100 mL IVPB  Status:  Discontinued        1 g 200 mL/hr over 30 Minutes Intravenous Every 24 hours 12/15/23 2037 12/16/23 0549   12/16/23 1800  [MAR Hold]  metroNIDAZOLE (FLAGYL) IVPB 500 mg        (MAR  Hold since Fri 12/17/2023 at 0942.Hold Reason: Transfer to a Procedural area)   500 mg 100 mL/hr over 60 Minutes Intravenous Every 12 hours 12/16/23 1511     12/16/23 1200  vancomycin  (VANCOREADY) IVPB 750 mg/150 mL  Status:  Discontinued        750 mg 150 mL/hr over 60 Minutes Intravenous Every T-Th-Sa (Hemodialysis) 12/15/23 2312 12/16/23 0549   12/16/23 0600  metroNIDAZOLE (FLAGYL) IVPB 500 mg  Status:  Discontinued        500 mg 100 mL/hr over 60 Minutes Intravenous Every 12 hours 12/15/23 2035 12/16/23 0549   12/16/23 0600  [MAR Hold]  cefTRIAXone (ROCEPHIN) 2 g in sodium chloride  0.9 % 100 mL IVPB        (MAR Hold since Fri 12/17/2023 at 0942.Hold Reason: Transfer to a Procedural area)   2 g 200 mL/hr over 30 Minutes Intravenous Every 24 hours 12/16/23 0553     12/15/23 2245  vancomycin  (VANCOREADY) IVPB 500 mg/100 mL        500 mg 100 mL/hr over 60 Minutes Intravenous  Once 12/15/23 2236 12/16/23 0532   12/15/23 1600  ceFEPIme (MAXIPIME) 2 g in sodium chloride  0.9 % 100 mL IVPB        2 g 200 mL/hr over 30 Minutes Intravenous  Once 12/15/23 1548 12/15/23 1819   12/15/23 1600  metroNIDAZOLE (FLAGYL) IVPB 500 mg        500 mg 100 mL/hr over 60 Minutes Intravenous  Once 12/15/23 1548 12/15/23 1955   12/15/23 1600  vancomycin  (VANCOCIN ) IVPB 1000 mg/200 mL premix        1,000 mg 200 mL/hr over 60 Minutes Intravenous  Once 12/15/23 1548 12/15/23 2128              Family Communication/Anticipated D/C date and plan/Code Status   DVT prophylaxis: heparin  injection 5,000 Units Start: 12/15/23 2200     Code Status: Full Code  Family Communication: Plan with Avis Lemming, niece, over the phone  Disposition Plan: Plan to discharge to SNF   Status is: Inpatient Remains inpatient appropriate because: Sepsis, E. coli bacteremia       Subjective:   Interval events noted.  Patient is more awake and alert today.  She is able to answer simple questions but still has some confusion.   Unclear if this is at baseline.  IV nurse was at the bedside.  Objective:    Vitals:   12/17/23 0001 12/17/23 0500 12/17/23 0729 12/17/23 0945  BP: (!) 114/49  134/74 (!) 181/51  Pulse: 91  95 88  Resp: 20   20  Temp: 98.7 F (37.1 C)  100 F (  37.8 C) (!) 96.5 F (35.8 C)  TempSrc:    Temporal  SpO2: 100%  100% 100%  Weight:  62.3 kg    Height:       No data found.   Intake/Output Summary (Last 24 hours) at 12/17/2023 1039 Last data filed at 12/17/2023 0619 Gross per 24 hour  Intake 730.92 ml  Output 0 ml  Net 730.92 ml   Filed Weights   12/15/23 1548 12/17/23 0500  Weight: 66.8 kg 62.3 kg    Exam:  GEN: NAD SKIN: Warm and dry.  PermCath in right upper chest.  Sacral wound noted.   EYES: No pallor or icterus ENT: MMM CV: RRR PULM: CTA B ABD: soft, ND, NT, +BS CNS: Alert and oriented to person.  Spontaneous twisting movements of the mouth noted (? tardive dyskinesia). EXT: No edema or tenderness       Data Reviewed:   I have personally reviewed following labs and imaging studies:  Labs: Labs show the following:   Basic Metabolic Panel: Recent Labs  Lab 12/15/23 1819 12/16/23 0532 12/17/23 0610  NA 142 139 138  K 3.6 3.6 3.5  CL 103 102 100  CO2 26 27 27   GLUCOSE 139* 110* 83  BUN 45* 53* 31*  CREATININE 3.61* 4.13* 2.53*  CALCIUM  8.8* 9.0 8.7*   GFR Estimated Creatinine Clearance: 18.5 mL/min (A) (by C-G formula based on SCr of 2.53 mg/dL (H)). Liver Function Tests: Recent Labs  Lab 12/15/23 1819 12/16/23 0532 12/17/23 0610  AST 214* 184* 137*  ALT 67* 61* 44  ALKPHOS 1,035* 974* 829*  BILITOT 0.9 1.4* 1.3*  PROT 6.9 6.9 6.5  ALBUMIN 2.1* 2.1* 2.0*   No results for input(s): "LIPASE", "AMYLASE" in the last 168 hours. Recent Labs  Lab 12/15/23 1819  AMMONIA <13   Coagulation profile Recent Labs  Lab 12/15/23 1820  INR 1.3*    CBC: Recent Labs  Lab 12/15/23 1819 12/16/23 0532 12/17/23 0610  WBC 18.2* 21.3* 21.0*   NEUTROABS 15.8*  --   --   HGB 7.7* 6.7* 7.9*  HCT 24.8* 22.3* 24.8*  MCV 92.5 93.7 89.9  PLT 414* 415* 438*   Cardiac Enzymes: Recent Labs  Lab 12/15/23 1819  CKTOTAL 11*   BNP (last 3 results) No results for input(s): "PROBNP" in the last 8760 hours. CBG: Recent Labs  Lab 12/16/23 0917  GLUCAP 111*   D-Dimer: No results for input(s): "DDIMER" in the last 72 hours. Hgb A1c: No results for input(s): "HGBA1C" in the last 72 hours. Lipid Profile: No results for input(s): "CHOL", "HDL", "LDLCALC", "TRIG", "CHOLHDL", "LDLDIRECT" in the last 72 hours. Thyroid function studies: No results for input(s): "TSH", "T4TOTAL", "T3FREE", "THYROIDAB" in the last 72 hours.  Invalid input(s): "FREET3" Anemia work up: Recent Labs    12/16/23 0532  FERRITIN 3,187*  TIBC 158*  IRON 7*   Sepsis Labs: Recent Labs  Lab 12/15/23 1630 12/15/23 1819 12/16/23 0532 12/17/23 0610  WBC  --  18.2* 21.3* 21.0*  LATICACIDVEN 1.4 1.1  --   --     Microbiology Recent Results (from the past 240 hours)  Resp panel by RT-PCR (RSV, Flu A&B, Covid) Anterior Nasal Swab     Status: None   Collection Time: 12/15/23  4:11 PM   Specimen: Anterior Nasal Swab  Result Value Ref Range Status   SARS Coronavirus 2 by RT PCR NEGATIVE NEGATIVE Final    Comment: (NOTE) SARS-CoV-2 target nucleic acids are NOT DETECTED.  The SARS-CoV-2 RNA is generally detectable in upper respiratory specimens during the acute phase of infection. The lowest concentration of SARS-CoV-2 viral copies this assay can detect is 138 copies/mL. A negative result does not preclude SARS-Cov-2 infection and should not be used as the sole basis for treatment or other patient management decisions. A negative result may occur with  improper specimen collection/handling, submission of specimen other than nasopharyngeal swab, presence of viral mutation(s) within the areas targeted by this assay, and inadequate number of  viral copies(<138 copies/mL). A negative result must be combined with clinical observations, patient history, and epidemiological information. The expected result is Negative.  Fact Sheet for Patients:  BloggerCourse.com  Fact Sheet for Healthcare Providers:  SeriousBroker.it  This test is no t yet approved or cleared by the [place]  FDA and  has been authorized for detection and/or diagnosis of SARS-CoV-2 by FDA under an Emergency Use Authorization (EUA). This EUA will remain  in effect (meaning this test can be used) for the duration of the COVID-19 declaration under Section 564(b)(1) of the Act, 21 U.S.C.section 360bbb-3(b)(1), unless the authorization is terminated  or revoked sooner.       Influenza A by PCR NEGATIVE NEGATIVE Final   Influenza B by PCR NEGATIVE NEGATIVE Final    Comment: (NOTE) The Xpert Xpress SARS-CoV-2/FLU/RSV plus assay is intended as an aid in the diagnosis of influenza from Nasopharyngeal swab specimens and should not be used as a sole basis for treatment. Nasal washings and aspirates are unacceptable for Xpert Xpress SARS-CoV-2/FLU/RSV testing.  Fact Sheet for Patients: BloggerCourse.com  Fact Sheet for Healthcare Providers: SeriousBroker.it  This test is not yet approved or cleared by the [place]  FDA and has been authorized for detection and/or diagnosis of SARS-CoV-2 by FDA under an Emergency Use Authorization (EUA). This EUA will remain in effect (meaning this test can be used) for the duration of the COVID-19 declaration under Section 564(b)(1) of the Act, 21 U.S.C. section 360bbb-3(b)(1), unless the authorization is terminated or revoked.     Resp Syncytial Virus by PCR NEGATIVE NEGATIVE Final    Comment: (NOTE) Fact Sheet for Patients: BloggerCourse.com  Fact Sheet for Healthcare  Providers: SeriousBroker.it  This test is not yet approved or cleared by the [place]  FDA and has been authorized for detection and/or diagnosis of SARS-CoV-2 by FDA under an Emergency Use Authorization (EUA). This EUA will remain in effect (meaning this test can be used) for the duration of the COVID-19 declaration under Section 564(b)(1) of the Act, 21 U.S.C. section 360bbb-3(b)(1), unless the authorization is terminated or revoked.  Performed at [facility], [address] [place], [place] [address]   Blood Culture (routine x 2)     Status: None (Preliminary result)   Collection Time: 12/15/23  4:12 PM   Specimen: BLOOD  Result Value Ref Range Status   Specimen Description   Final    BLOOD RIGHT ANTECUBITAL Performed at [facility], [address] [place], [place] [address]    Special Requests   Final    BOTTLES DRAWN AEROBIC AND ANAEROBIC Blood Culture results may not be optimal due to an inadequate volume of blood received in culture bottles Performed at [facility], [address], [place], [place] [address]    Culture  Setup Time   Final    GRAM NEGATIVE RODS IN BOTH AEROBIC AND ANAEROBIC BOTTLES Organism ID to follow CRITICAL RESULT CALLED TO, READ BACK BY AND VERIFIED WITH:  JASON ROBINS  AT 1610 12/16/23 JG Performed at [facility], [address] [place], [place] [address]    Culture GRAM NEGATIVE RODS  Final   Report Status PENDING  Incomplete  Aerobic/Anaerobic Culture w Gram Stain (surgical/deep wound)     Status: None (Preliminary result)   Collection Time: 12/15/23  4:12 PM   Specimen: Wound  Result Value Ref Range Status   Specimen Description   Final    WOUND Performed at [facility], [address], [place], [place] [address]    Special Requests   Final    SACRAL Performed at [facility], [address] [place], [place] [address]    Gram Stain NO WBC  SEEN RARE GRAM NEGATIVE RODS   Final   Culture   Final    ABUNDANT GRAM NEGATIVE RODS FEW GRAM NEGATIVE RODS SUSCEPTIBILITIES TO FOLLOW CULTURE REINCUBATED FOR BETTER GROWTH Performed at [facility], [address] [place], [place] [address]    Report Status PENDING  Incomplete  Blood Culture ID Panel (Reflexed)     Status: Abnormal   Collection Time: 12/15/23  4:12 PM  Result Value Ref Range Status   Enterococcus faecalis NOT DETECTED NOT DETECTED Final   Enterococcus Faecium NOT DETECTED NOT DETECTED Final   Listeria monocytogenes NOT DETECTED NOT DETECTED Final   Staphylococcus species NOT DETECTED NOT DETECTED Final   Staphylococcus aureus (BCID) NOT DETECTED NOT DETECTED Final   Staphylococcus epidermidis NOT DETECTED NOT DETECTED Final   Staphylococcus lugdunensis NOT DETECTED NOT DETECTED Final   Streptococcus species NOT DETECTED NOT DETECTED Final   Streptococcus agalactiae NOT DETECTED NOT DETECTED Final   Streptococcus pneumoniae NOT DETECTED NOT DETECTED Final   Streptococcus pyogenes NOT DETECTED NOT DETECTED Final   A.calcoaceticus-baumannii NOT DETECTED NOT DETECTED Final   Bacteroides fragilis NOT DETECTED NOT DETECTED Final   Enterobacterales DETECTED (A) NOT DETECTED Final    Comment: Enterobacterales represent a large order of gram negative bacteria, not a single organism. CRITICAL RESULT CALLED TO, READ BACK BY AND VERIFIED WITH:  JASON ROBINS AT 2130 12/16/23 JG    Enterobacter cloacae complex NOT DETECTED NOT DETECTED Final   Escherichia coli DETECTED (A) NOT DETECTED Final    Comment: CRITICAL RESULT CALLED TO, READ BACK BY AND VERIFIED WITH:  JASON ROBINS AT 0529 12/16/23 JG    Klebsiella aerogenes NOT DETECTED NOT DETECTED Final   Klebsiella oxytoca NOT DETECTED NOT DETECTED Final   Klebsiella pneumoniae NOT DETECTED NOT DETECTED Final   Proteus species NOT DETECTED NOT DETECTED Final   Salmonella species NOT DETECTED NOT DETECTED Final    Serratia marcescens NOT DETECTED NOT DETECTED Final   Haemophilus influenzae NOT DETECTED NOT DETECTED Final   Neisseria meningitidis NOT DETECTED NOT DETECTED Final   Pseudomonas aeruginosa NOT DETECTED NOT DETECTED Final   Stenotrophomonas maltophilia NOT DETECTED NOT DETECTED Final   Candida albicans NOT DETECTED NOT DETECTED Final   Candida auris NOT DETECTED NOT DETECTED Final   Candida glabrata NOT DETECTED NOT DETECTED Final   Candida krusei NOT DETECTED NOT DETECTED Final   Candida parapsilosis NOT DETECTED NOT DETECTED Final   Candida tropicalis NOT DETECTED NOT DETECTED Final   Cryptococcus neoformans/gattii NOT DETECTED NOT DETECTED Final   CTX-M ESBL NOT DETECTED NOT DETECTED Final   Carbapenem resistance IMP NOT DETECTED NOT DETECTED Final   Carbapenem resistance KPC NOT DETECTED NOT DETECTED Final   Carbapenem resistance NDM NOT DETECTED NOT DETECTED Final   Carbapenem resist OXA  48 LIKE NOT DETECTED NOT DETECTED Final   Carbapenem resistance VIM NOT DETECTED NOT DETECTED Final    Comment: Performed at [facility], [address] [place], [place] [address]  Blood Culture (routine x 2)     Status: None (Preliminary result)   Collection Time: 12/15/23  4:30 PM   Specimen: BLOOD  Result Value Ref Range Status   Specimen Description   Final    BLOOD RIGHT ANTECUBITAL Performed at [facility], [address], [place], [place] [address]    Special Requests   Final    BOTTLES DRAWN AEROBIC AND ANAEROBIC Blood Culture results may not be optimal due to an inadequate volume of blood received in culture bottles Performed at [facility], [address], [place], [place] [address]    Culture  Setup Time   Final    GRAM NEGATIVE RODS IN BOTH AEROBIC AND ANAEROBIC BOTTLES CRITICAL VALUE NOTED.  VALUE IS CONSISTENT WITH PREVIOUSLY REPORTED AND CALLED VALUE. Performed at [facility], [address] [place], [place] [address]    Culture  GRAM NEGATIVE RODS  Final   Report Status PENDING  Incomplete  Urine Culture (for pregnant, neutropenic or urologic patients or patients with an indwelling urinary catheter)     Status: None   Collection Time: 12/15/23  5:40 PM   Specimen: Urine, Clean Catch  Result Value Ref Range Status   Specimen Description   Final    URINE, CLEAN CATCH Performed at [facility], [address] [place], [place] [address]    Special Requests   Final    NONE Performed at [facility], [address], [place], [place] [address]    Culture   Final    NO GROWTH Performed at [facility], [address], [place], [place] [address]    Report Status 12/16/2023 FINAL  Final  MRSA Next Gen by PCR, Nasal     Status: None   Collection Time: 12/16/23  5:28 PM   Specimen: Nasal Mucosa; Nasal Swab  Result Value Ref Range Status   MRSA by PCR Next Gen NOT DETECTED NOT DETECTED Final    Comment: (NOTE) The GeneXpert MRSA Assay (FDA approved for NASAL specimens only), is one component of a comprehensive MRSA colonization surveillance program. It is not intended to diagnose MRSA infection nor to guide or monitor treatment for MRSA infections. Test performance is not FDA approved in patients less than 63 years old. Performed at [facility], [address] [place], [place] [address]     Procedures and diagnostic studies:  MR ABDOMEN MRCP W WO CONTAST Result Date: 12/16/2023 CLINICAL DATA:  Right upper quadrant pain. Evaluate for biliary disease. EXAM: MRI ABDOMEN WITHOUT AND WITH CONTRAST (INCLUDING MRCP) TECHNIQUE: Multiplanar multisequence MR imaging of the abdomen was performed both before and after the administration of intravenous contrast. Heavily T2-weighted images of the biliary and pancreatic ducts were obtained, and three-dimensional MRCP images were rendered by post processing. CONTRAST:  7mL GADAVIST GADOBUTROL 1 MMOL/ML IV SOLN COMPARISON:  CT chest, abdomen and  pelvis from 12/15/2023 FINDINGS: Note, exam detail is diminished due to motion artifact. Lower chest: Trace bilateral pleural effusions with overlying subpleural consolidation noted in both lung bases. . Hepatobiliary: Within the right lobe of liver there are multifocal areas of mural enhancing T2 hyperintense foci which are favored to represent dilated biliary radicles. The less dilated more central bile ducts within the right lobe have irregular appearance with  multiple focal areas of narrowing giving it a string of pearls like appearance. Status post cholecystectomy. The common bile duct is dilated measuring 2.1 cm proximally with be quite narrowing of the distal 7 mm of the CBD, image 8/14. Debris is noted within the CBD with a few small foci of signal void artifact measuring approximately 3 mm which may reflect small stones versus signal void artifact. Pancreas: No mass, inflammatory changes, or other parenchymal abnormality identified. Spleen:  Within normal limits in size and appearance. Adrenals/Urinary Tract: Normal adrenal glands. Bilateral renal cortical atrophy. No hydronephrosis. Multiple bilateral renal cysts are identified. The largest arises off the upper pole of the left kidney measuring 3.7 x 2.9 cm. Stomach/Bowel: Stomach is normal. No dilated loops of bowel identified within the imaged portions of the abdomen. Vascular/Lymphatic: Normal caliber of the abdominal aorta. The upper abdominal vascularity appears patent. No adenopathy. Other:  No free fluid or fluid collections. Musculoskeletal: Within the right paramidline ventral abdominal wall there is a small subcutaneous nodule measuring 1.5 cm. This is T1 and T2 hyperintense without enhancement and is favored to represent a small hematoma. Possibly due to injection site. Subcutaneous edema is noted along both flanks. No enhancing bone abnormality. IMPRESSION: 1. Exam detail is diminished due to motion artifact. 2. Within the right lobe of liver  there are multifocal areas of mural enhancing T2 hyperintense foci which are favored to represent dilated biliary radicles. The less dilated more central bile ducts within the right lobe have irregular appearance with multiple focal areas of narrowing giving it a string of pearls like appearance. In the acute setting imaging findings are concerning for cholangitis. Differential considerations include multiple peripheral liver abscesses. Underlying malignancy is considered less favored. Follow-up imaging advised to ensure resolution following appropriate therapy. 3. Status post cholecystectomy. Common bile duct dilatation measures up to 2.1 cm. Abrupt focal narrowing of the distal 7 mm of the CBD is favored to represent underlying stricture possibly postinflammatory or secondary to previously passed stone. Debris is noted within the CBD with a few small foci of signal void artifact measuring approximately 3 mm which may reflect small stones versus signal void artifact. 4. Trace bilateral pleural effusions with overlying subpleural consolidation noted in both lung bases. 5. Bilateral renal cortical atrophy. 6. Small subcutaneous nodule within the right paramidline ventral abdominal wall is favored to represent a small hematoma. Possibly due to injection site. Electronically Signed   By: Kimberley Penman M.D.   On: 12/16/2023 04:45   MR 3D Recon At Scanner Result Date: 12/16/2023 CLINICAL DATA:  Right upper quadrant pain. Evaluate for biliary disease. EXAM: MRI ABDOMEN WITHOUT AND WITH CONTRAST (INCLUDING MRCP) TECHNIQUE: Multiplanar multisequence MR imaging of the abdomen was performed both before and after the administration of intravenous contrast. Heavily T2-weighted images of the biliary and pancreatic ducts were obtained, and three-dimensional MRCP images were rendered by post processing. CONTRAST:  7mL GADAVIST GADOBUTROL 1 MMOL/ML IV SOLN COMPARISON:  CT chest, abdomen and pelvis from 12/15/2023 FINDINGS: Note,  exam detail is diminished due to motion artifact. Lower chest: Trace bilateral pleural effusions with overlying subpleural consolidation noted in both lung bases. . Hepatobiliary: Within the right lobe of liver there are multifocal areas of mural enhancing T2 hyperintense foci which are favored to represent dilated biliary radicles. The less dilated more central bile ducts within the right lobe have irregular appearance with multiple focal areas of narrowing giving it a string of pearls like appearance. Status post cholecystectomy. The common bile  duct is dilated measuring 2.1 cm proximally with be quite narrowing of the distal 7 mm of the CBD, image 8/14. Debris is noted within the CBD with a few small foci of signal void artifact measuring approximately 3 mm which may reflect small stones versus signal void artifact. Pancreas: No mass, inflammatory changes, or other parenchymal abnormality identified. Spleen:  Within normal limits in size and appearance. Adrenals/Urinary Tract: Normal adrenal glands. Bilateral renal cortical atrophy. No hydronephrosis. Multiple bilateral renal cysts are identified. The largest arises off the upper pole of the left kidney measuring 3.7 x 2.9 cm. Stomach/Bowel: Stomach is normal. No dilated loops of bowel identified within the imaged portions of the abdomen. Vascular/Lymphatic: Normal caliber of the abdominal aorta. The upper abdominal vascularity appears patent. No adenopathy. Other:  No free fluid or fluid collections. Musculoskeletal: Within the right paramidline ventral abdominal wall there is a small subcutaneous nodule measuring 1.5 cm. This is T1 and T2 hyperintense without enhancement and is favored to represent a small hematoma. Possibly due to injection site. Subcutaneous edema is noted along both flanks. No enhancing bone abnormality. IMPRESSION: 1. Exam detail is diminished due to motion artifact. 2. Within the right lobe of liver there are multifocal areas of mural  enhancing T2 hyperintense foci which are favored to represent dilated biliary radicles. The less dilated more central bile ducts within the right lobe have irregular appearance with multiple focal areas of narrowing giving it a string of pearls like appearance. In the acute setting imaging findings are concerning for cholangitis. Differential considerations include multiple peripheral liver abscesses. Underlying malignancy is considered less favored. Follow-up imaging advised to ensure resolution following appropriate therapy. 3. Status post cholecystectomy. Common bile duct dilatation measures up to 2.1 cm. Abrupt focal narrowing of the distal 7 mm of the CBD is favored to represent underlying stricture possibly postinflammatory or secondary to previously passed stone. Debris is noted within the CBD with a few small foci of signal void artifact measuring approximately 3 mm which may reflect small stones versus signal void artifact. 4. Trace bilateral pleural effusions with overlying subpleural consolidation noted in both lung bases. 5. Bilateral renal cortical atrophy. 6. Small subcutaneous nodule within the right paramidline ventral abdominal wall is favored to represent a small hematoma. Possibly due to injection site. Electronically Signed   By: Kimberley Penman M.D.   On: 12/16/2023 04:45   CT Head Wo Contrast Result Date: 12/15/2023 CLINICAL DATA:  Mental status change, unknown cause EXAM: CT HEAD WITHOUT CONTRAST TECHNIQUE: Contiguous axial images were obtained from the base of the skull through the vertex without intravenous contrast. RADIATION DOSE REDUCTION: This exam was performed according to the departmental dose-optimization program which includes automated exposure control, adjustment of the mA and/or kV according to patient size and/or use of iterative reconstruction technique. COMPARISON:  Head CT 09/17/2023 FINDINGS: Brain: No intracranial hemorrhage, mass effect, or midline shift. Stable degree of  atrophy. No hydrocephalus. The basilar cisterns are patent. Moderate to advanced periventricular and deep white matter hypodensity typical of chronic small vessel ischemia, unchanged. Remote lacunar infarct in the left basal ganglia. Remote left cerebellar infarcts. No evidence of territorial infarct or acute ischemia. No extra-axial or intracranial fluid collection. Vascular: Atherosclerosis of skullbase vasculature without hyperdense vessel or abnormal calcification. Skull: No fracture or focal lesion. Sinuses/Orbits: No acute finding. Other: None. IMPRESSION: 1. No acute intracranial abnormality. 2. Stable atrophy, chronic small vessel ischemia, and remote infarcts. Electronically Signed   By: Alvina Axon.D.  On: 12/15/2023 18:58   CT CHEST ABDOMEN PELVIS W CONTRAST Result Date: 12/15/2023 CLINICAL DATA:  Sepsis. EXAM: CT CHEST, ABDOMEN, AND PELVIS WITH CONTRAST TECHNIQUE: Multidetector CT imaging of the chest, abdomen and pelvis was performed following the standard protocol during bolus administration of intravenous contrast. RADIATION DOSE REDUCTION: This exam was performed according to the departmental dose-optimization program which includes automated exposure control, adjustment of the mA and/or kV according to patient size and/or use of iterative reconstruction technique. CONTRAST:  OMNIPAQUE  IOHEXOL  300 MG/ML  SOLN COMPARISON:  CT of the abdomen pelvis dated 09/06/2023. FINDINGS: Evaluation of this exam is limited due to respiratory motion. CT CHEST FINDINGS Cardiovascular: There is no cardiomegaly or pericardial effusion. There is coronary vascular calcification and calcification of the mitral annulus. Moderate atherosclerotic calcification of the thoracic aorta. No aneurysmal dilatation or dissection. The central pulmonary arteries are grossly unremarkable. Right IJ catheter with tip in the right atrium. Mediastinum/Nodes: No obvious hilar or mediastinal adenopathy. The esophagus is  grossly unremarkable. No mediastinal fluid collection. Lungs/Pleura: There is bibasilar subpleural atelectasis. Pneumonia is less likely but not excluded clinical correlation recommended. No pleural effusion or pneumothorax. The central airways are patent. Musculoskeletal: No acute osseous pathology. CT ABDOMEN PELVIS FINDINGS No intra-abdominal free air or free fluid. Hepatobiliary: There is diffuse biliary duct dilatation. Several small hypodense nodular areas in the liver measure up to 15 mm in the right lobe. These are mild evaluated with concerning for intrahepatic abscesses versus less likely metastatic disease. Clinical correlation recommended. The common bile duct measures approximately 2 cm in diameter. No radiopaque calculus noted in the central CBD. Pancreas: Unremarkable. No pancreatic ductal dilatation or surrounding inflammatory changes. Spleen: Normal in size without focal abnormality. Adrenals/Urinary Tract: The right adrenal gland is unremarkable. There is a 2 cm left adrenal nodule: Not evaluated on this CT but present on the prior study. This was previously characterized as adenoma on CT of 11/18/2019. Moderate bilateral renal parenchyma atrophy. Small bilateral renal cysts and hypodense lesions which are too small to characterize. There is no hydronephrosis on either side. The urinary bladder is minimally distended. Stomach/Bowel: Percutaneous gastrostomy with balloon in the proximal body of the stomach. There is moderate to large amount of dense stool throughout the colon and within the rectal vault. There is no bowel obstruction or active inflammation. The appendix is normal. Vascular/Lymphatic: Advanced aortoiliac atherosclerotic disease. The IVC is unremarkable. No portal venous gas. There is no adenopathy. Reproductive: Hysterectomy.  No suspicious adnexal masses. Other: Midline anterior pelvic wall incisional scar. Ventral hernia repair mesh. Multiple subcutaneous nodules in the anterior  abdominal wall likely related to subcutaneous injections. No fluid collection. Musculoskeletal: Osteopenia with degenerative changes of the spine. Lower lumbar posterior fusion. Severe arthritic changes of the right hip. No acute osseous pathology. IMPRESSION: 1. Diffuse biliary duct dilatation with several small hypodense nodular areas in the liver concerning for intrahepatic abscesses versus less likely metastatic disease. Further evaluation with MRI/MRCP is recommended. 2. Bibasilar subpleural atelectasis. Pneumonia is less likely but not excluded. 3. 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 4. Constipation with possible fecal impaction in the rectal vault. No bowel obstruction. Normal appendix. 5.  Aortic Atherosclerosis (ICD10-I70.0). Electronically Signed   By: Angus Bark M.D.   On: 12/15/2023 18:57   DG Chest Port 1 View Result Date: 12/15/2023 CLINICAL DATA:  Question of sepsis to evaluate for abnormality. Dialysis patient. Altered mental status. Fever. EXAM: PORTABLE CHEST 1 VIEW COMPARISON:  09/21/2023 FINDINGS: Right central venous catheter with tip over the right atrium. Postoperative changes in the cervical spine. Shallow inspiration. Heart size and pulmonary vascularity are normal for technique. Lungs are clear. No pleural effusion or pneumothorax. Mediastinal contours appear intact. Degenerative changes in the spine. IMPRESSION: Shallow inspiration.  No evidence of active pulmonary disease. Electronically Signed   By: Boyce Byes M.D.   On: 12/15/2023 17:55               LOS: 2 days   Meri Pelot  [facility]   Pager on [email or web address]. If 7PM-7AM, please contact night-coverage at [email or web address]     12/17/2023, 10:39 AM

## 2023-12-17 NOTE — Anesthesia Postprocedure Evaluation (Signed)
 Anesthesia Post Note  Patient: Rashaunda Rahl  Procedure(s) Performed: ERCP, WITH INTERVENTION IF INDICATED Balloon dilation wire-guided INSERTION, STENT, BILE DUCT  Patient location during evaluation: Endoscopy Anesthesia Type: General Level of consciousness: awake and alert Pain management: pain level controlled Vital Signs Assessment: post-procedure vital signs reviewed and stable Respiratory status: spontaneous breathing, nonlabored ventilation, respiratory function stable and patient connected to nasal cannula oxygen Cardiovascular status: blood pressure returned to baseline and stable Postop Assessment: no apparent nausea or vomiting Anesthetic complications: no   No notable events documented.   Last Vitals:  Vitals:   12/17/23 1133 12/17/23 1144  BP: (!) 115/59 106/63  Pulse: 94   Resp: 17   Temp:    SpO2: 98% 98%    Last Pain:  Vitals:   12/17/23 1133  TempSrc:   PainSc: 0-No pain                 Vanice Genre

## 2023-12-17 NOTE — Transfer of Care (Signed)
 Immediate Anesthesia Transfer of Care Note  Patient: Sherry [name]  Procedure(s) Performed: ERCP, WITH INTERVENTION IF INDICATED Balloon dilation wire-guided INSERTION, STENT, BILE DUCT  Patient Location: Endoscopy Unit  Anesthesia Type:General  Level of Consciousness: drowsy  Airway & Oxygen Therapy: Patient Spontanous Breathing  Post-op Assessment: Report given to RN and Post -op Vital signs reviewed and stable  Post vital signs: Reviewed and stable  Last Vitals:  Vitals Value Taken Time  BP 103/63 12/17/23 1122  Temp 36.1 C 12/17/23 1122  Pulse 102 12/17/23 1122  Resp 15 12/17/23 1122  SpO2 97 % 12/17/23 1122    Last Pain:  Vitals:   12/17/23 1122  TempSrc: Temporal  PainSc: Asleep         Complications: No notable events documented.

## 2023-12-17 NOTE — Anesthesia Preprocedure Evaluation (Signed)
 Anesthesia Evaluation  Patient identified by MRN, date of birth, ID band Patient awake    Reviewed: Allergy & Precautions, H&P , NPO status , Patient's Chart, lab work & pertinent test results, reviewed documented beta blocker date and time   History of Anesthesia Complications Negative for: history of anesthetic complications  Airway Mallampati: III  TM Distance: >3 FB Neck ROM: full    Dental  (+) Edentulous Upper, Edentulous Lower, Dental Advidsory Given   Pulmonary shortness of breath, neg sleep apnea, neg COPD, neg recent URI, Current Smoker and Patient abstained from smoking.   Pulmonary exam normal breath sounds clear to auscultation       Cardiovascular Exercise Tolerance: Good hypertension, +CHF  (-) Past MI and (-) Cardiac Stents Normal cardiovascular exam(-) dysrhythmias (-) Valvular Problems/Murmurs Rhythm:regular Rate:Normal     Neuro/Psych Seizures -,  PSYCHIATRIC DISORDERS Anxiety Depression    TIA Neuromuscular disease CVA    GI/Hepatic ,GERD  ,,(+) Hepatitis -, C  Endo/Other  negative endocrine ROS    Renal/GU ESRF and DialysisRenal disease  negative genitourinary   Musculoskeletal   Abdominal   Peds  Hematology  (+) Blood dyscrasia, anemia   Anesthesia Other Findings Past Medical History: 08/27/2017: Alcohol  use disorder, severe, dependence (HCC) No date: Anxiety 07/19/2020: Body mass index (BMI) 40.0-44.9, adult (HCC) No date: Breast cancer (HCC)     Comment:  right lumpectemy and lymph node  05/24/2020: Cervical spondylosis 05/24/2020: Chronic bilateral low back pain with right-sided sciatica 05/09/2019: Chronic diastolic CHF (congestive heart failure) (HCC) 12/31/2017: Cigarette smoker 08/22/2021: ESRD (end stage renal disease) on dialysis (HCC)     Comment:  M-W-F 07/06/2017: Essential hypertension No date: GERD (gastroesophageal reflux disease) 12/17/2012: Hepatitis C virus infection  cured after antiviral drug  therapy     Comment:  Telephone Encounter - Nerissa Bannister, RN - 12/28/2016 9:54              AM EDT Patient called for HCV RNA test results. EOT               08-26-16 - not detected. 12 week post treatment 12-11-16 -               not detected. Informed patient that she was cured.               Patient verbalized understanding.       01/31/2016: Hx of bipolar disorder 01/09/2021: Impaired functional mobility, balance, gait, and endurance No date: MRSA infection     Comment:  of breast incision 09/06/2017: Neuroleptic-induced tardive dyskinesia     Comment:  Abilify 08/27/2017: Opioid use disorder, severe, dependence (HCC) 02/13/2013: Restless legs syndrome     Comment:  Formatting of this note might be different from the               original. IMPRESSION: Possible. Will follow. 02/13/2013: Subacute dyskinesia due to drug     Comment:  Formatting of this note might be different from the               original. STORY: Tardive dyskinesia and mild limb               dyskinesia, LE>UE which likely due to prolonged               antipsychotic used, abilify. Couldn't tolerate artane,               gabapentin , clonazepam and xenazine.`[id number]`IMPRESSION:  Well tolerated depakote  250mg  bid, mood aspect is better               as well. Will increase to 500mg  bid. ADR was discussed.                RTC 4-6 weeks. 2020: TIA (transient ischemic attack)     Comment:  P/w BP >230/120 and neurologic symptoms, presumed TIA   Reproductive/Obstetrics negative OB ROS                             Anesthesia Physical Anesthesia Plan  ASA: 3  Anesthesia Plan: General   Post-op Pain Management:    Induction:   PONV Risk Score and Plan: 2 and Ondansetron , Dexamethasone  and Treatment may vary due to age or medical condition  Airway Management Planned:   Additional Equipment:   Intra-op Plan:   Post-operative Plan:   Informed  Consent: I have reviewed the patients History and Physical, chart, labs and discussed the procedure including the risks, benefits and alternatives for the proposed anesthesia with the patient or authorized representative who has indicated his/her understanding and acceptance.     Dental Advisory Given  Plan Discussed with: Anesthesiologist, CRNA and Surgeon  Anesthesia Plan Comments:        Anesthesia Quick Evaluation

## 2023-12-17 NOTE — Progress Notes (Signed)
 [facility]  ROUNDING NOTE   Subjective:   Patient seen laying in bed No family present More awake and alert today Aphagia when responding to simple questions NPO for procedure   Objective:  Vital signs in last 24 hours:  Temp:  [96.5 F (35.8 C)-100 F (37.8 C)] 96.5 F (35.8 C) (06/06 0945) Pulse Rate:  [77-95] 88 (06/06 0945) Resp:  [12-30] 20 (06/06 0945) BP: (96-181)/(49-74) 181/51 (06/06 0945) SpO2:  [94 %-100 %] 100 % (06/06 0945) Weight:  [62.3 kg] 62.3 kg (06/06 0500)  Weight change: -4.515 kg Filed Weights   12/15/23 1548 12/17/23 0500  Weight: 66.8 kg 62.3 kg    Intake/Output: I/O last 3 completed shifts: In: 730.9 [I.V.:210.5; Blood:322; IV Piggyback:198.4] Out: 0    Intake/Output this shift:  No intake/output data recorded.  Physical Exam: General: NAD  Head: Normocephalic, atraumatic. Moist oral mucosal membranes  Eyes: Anicteric  Lungs:  Clear to auscultation  Heart: Regular rate and rhythm  Abdomen:  Soft, nontender  Extremities:  No peripheral edema.  Neurologic: Awake, alert, aphagia   Skin: Warm,dry, large sacral wound  Access: Rt internal jugular permcath    Basic Metabolic Panel: Recent Labs  Lab 12/15/23 1819 12/16/23 0532 12/17/23 0610  NA 142 139 138  K 3.6 3.6 3.5  CL 103 102 100  CO2 26 27 27   GLUCOSE 139* 110* 83  BUN 45* 53* 31*  CREATININE 3.61* 4.13* 2.53*  CALCIUM  8.8* 9.0 8.7*    Liver Function Tests: Recent Labs  Lab 12/15/23 1819 12/16/23 0532 12/17/23 0610  AST 214* 184* 137*  ALT 67* 61* 44  ALKPHOS 1,035* 974* 829*  BILITOT 0.9 1.4* 1.3*  PROT 6.9 6.9 6.5  ALBUMIN 2.1* 2.1* 2.0*   No results for input(s): "LIPASE", "AMYLASE" in the last 168 hours. Recent Labs  Lab 12/15/23 1819  AMMONIA <13    CBC: Recent Labs  Lab 12/15/23 1819 12/16/23 0532 12/17/23 0610  WBC 18.2* 21.3* 21.0*  NEUTROABS 15.8*  --   --   HGB 7.7* 6.7* 7.9*  HCT 24.8* 22.3* 24.8*  MCV 92.5 93.7 89.9   PLT 414* 415* 438*    Cardiac Enzymes: Recent Labs  Lab 12/15/23 1819  CKTOTAL 11*    BNP: Invalid input(s): "POCBNP"  CBG: Recent Labs  Lab 12/16/23 0917  GLUCAP 111*    Microbiology: Results for orders placed or performed during the hospital encounter of 12/15/23  Resp panel by RT-PCR (RSV, Flu A&B, Covid) Anterior Nasal Swab     Status: None   Collection Time: 12/15/23  4:11 PM   Specimen: Anterior Nasal Swab  Result Value Ref Range Status   SARS Coronavirus 2 by RT PCR NEGATIVE NEGATIVE Final    Comment: (NOTE) SARS-CoV-2 target nucleic acids are NOT DETECTED.  The SARS-CoV-2 RNA is generally detectable in upper respiratory specimens during the acute phase of infection. The lowest concentration of SARS-CoV-2 viral copies this assay can detect is 138 copies/mL. A negative result does not preclude SARS-Cov-2 infection and should not be used as the sole basis for treatment or other patient management decisions. A negative result may occur with  improper specimen collection/handling, submission of specimen other than nasopharyngeal swab, presence of viral mutation(s) within the areas targeted by this assay, and inadequate number of viral copies(<138 copies/mL). A negative result must be combined with clinical observations, patient history, and epidemiological information. The expected result is Negative.  Fact Sheet for Patients:  BloggerCourse.com  Fact Sheet for  Healthcare Providers:  SeriousBroker.it  This test is no t yet approved or cleared by the [place]  FDA and  has been authorized for detection and/or diagnosis of SARS-CoV-2 by FDA under an Emergency Use Authorization (EUA). This EUA will remain  in effect (meaning this test can be used) for the duration of the COVID-19 declaration under Section 564(b)(1) of the Act, 21 U.S.C.section 360bbb-3(b)(1), unless the authorization is terminated  or  revoked sooner.       Influenza A by PCR NEGATIVE NEGATIVE Final   Influenza B by PCR NEGATIVE NEGATIVE Final    Comment: (NOTE) The Xpert Xpress SARS-CoV-2/FLU/RSV plus assay is intended as an aid in the diagnosis of influenza from Nasopharyngeal swab specimens and should not be used as a sole basis for treatment. Nasal washings and aspirates are unacceptable for Xpert Xpress SARS-CoV-2/FLU/RSV testing.  Fact Sheet for Patients: BloggerCourse.com  Fact Sheet for Healthcare Providers: SeriousBroker.it  This test is not yet approved or cleared by the [place]  FDA and has been authorized for detection and/or diagnosis of SARS-CoV-2 by FDA under an Emergency Use Authorization (EUA). This EUA will remain in effect (meaning this test can be used) for the duration of the COVID-19 declaration under Section 564(b)(1) of the Act, 21 U.S.C. section 360bbb-3(b)(1), unless the authorization is terminated or revoked.     Resp Syncytial Virus by PCR NEGATIVE NEGATIVE Final    Comment: (NOTE) Fact Sheet for Patients: BloggerCourse.com  Fact Sheet for Healthcare Providers: SeriousBroker.it  This test is not yet approved or cleared by the [place]  FDA and has been authorized for detection and/or diagnosis of SARS-CoV-2 by FDA under an Emergency Use Authorization (EUA). This EUA will remain in effect (meaning this test can be used) for the duration of the COVID-19 declaration under Section 564(b)(1) of the Act, 21 U.S.C. section 360bbb-3(b)(1), unless the authorization is terminated or revoked.  Performed at [facility], [address] [place], [place] [address]   Blood Culture (routine x 2)     Status: None (Preliminary result)   Collection Time: 12/15/23  4:12 PM   Specimen: BLOOD  Result Value Ref Range Status   Specimen Description   Final    BLOOD RIGHT  ANTECUBITAL Performed at [facility], [address] [place], [place] [address]    Special Requests   Final    BOTTLES DRAWN AEROBIC AND ANAEROBIC Blood Culture results may not be optimal due to an inadequate volume of blood received in culture bottles Performed at [facility], [address] [place], [place] [address]    Culture  Setup Time   Final    GRAM NEGATIVE RODS IN BOTH AEROBIC AND ANAEROBIC BOTTLES Organism ID to follow CRITICAL RESULT CALLED TO, READ BACK BY AND VERIFIED WITHLatina Pol AT 2130 12/16/23 JG Performed at [facility], [address], [place], [place] [address]    Culture GRAM NEGATIVE RODS  Final   Report Status PENDING  Incomplete  Aerobic/Anaerobic Culture w Gram Stain (surgical/deep wound)     Status: None (Preliminary result)   Collection Time: 12/15/23  4:12 PM   Specimen: Wound  Result Value Ref Range Status   Specimen Description   Final    WOUND Performed at [facility], [address], [place], [place] [address]    Special Requests   Final    SACRAL Performed at [facility], [address], [place], [place] [address]    Gram Stain NO  WBC SEEN RARE GRAM NEGATIVE RODS   Final   Culture   Final    ABUNDANT GRAM NEGATIVE RODS FEW GRAM NEGATIVE RODS SUSCEPTIBILITIES TO FOLLOW CULTURE REINCUBATED FOR BETTER GROWTH Performed at [facility], [address], [place], [place] [address]    Report Status PENDING  Incomplete  Blood Culture ID Panel (Reflexed)     Status: Abnormal   Collection Time: 12/15/23  4:12 PM  Result Value Ref Range Status   Enterococcus faecalis NOT DETECTED NOT DETECTED Final   Enterococcus Faecium NOT DETECTED NOT DETECTED Final   Listeria monocytogenes NOT DETECTED NOT DETECTED Final   Staphylococcus species NOT DETECTED NOT DETECTED Final   Staphylococcus aureus (BCID) NOT DETECTED NOT DETECTED Final   Staphylococcus epidermidis NOT DETECTED NOT DETECTED  Final   Staphylococcus lugdunensis NOT DETECTED NOT DETECTED Final   Streptococcus species NOT DETECTED NOT DETECTED Final   Streptococcus agalactiae NOT DETECTED NOT DETECTED Final   Streptococcus pneumoniae NOT DETECTED NOT DETECTED Final   Streptococcus pyogenes NOT DETECTED NOT DETECTED Final   A.calcoaceticus-baumannii NOT DETECTED NOT DETECTED Final   Bacteroides fragilis NOT DETECTED NOT DETECTED Final   Enterobacterales DETECTED (A) NOT DETECTED Final    Comment: Enterobacterales represent a large order of gram negative bacteria, not a single organism. CRITICAL RESULT CALLED TO, READ BACK BY AND VERIFIED WITH:  JASON ROBINS AT 6045 12/16/23 JG    Enterobacter cloacae complex NOT DETECTED NOT DETECTED Final   Escherichia coli DETECTED (A) NOT DETECTED Final    Comment: CRITICAL RESULT CALLED TO, READ BACK BY AND VERIFIED WITH:  JASON ROBINS AT 0529 12/16/23 JG    Klebsiella aerogenes NOT DETECTED NOT DETECTED Final   Klebsiella oxytoca NOT DETECTED NOT DETECTED Final   Klebsiella pneumoniae NOT DETECTED NOT DETECTED Final   Proteus species NOT DETECTED NOT DETECTED Final   Salmonella species NOT DETECTED NOT DETECTED Final   Serratia marcescens NOT DETECTED NOT DETECTED Final   Haemophilus influenzae NOT DETECTED NOT DETECTED Final   Neisseria meningitidis NOT DETECTED NOT DETECTED Final   Pseudomonas aeruginosa NOT DETECTED NOT DETECTED Final   Stenotrophomonas maltophilia NOT DETECTED NOT DETECTED Final   Candida albicans NOT DETECTED NOT DETECTED Final   Candida auris NOT DETECTED NOT DETECTED Final   Candida glabrata NOT DETECTED NOT DETECTED Final   Candida krusei NOT DETECTED NOT DETECTED Final   Candida parapsilosis NOT DETECTED NOT DETECTED Final   Candida tropicalis NOT DETECTED NOT DETECTED Final   Cryptococcus neoformans/gattii NOT DETECTED NOT DETECTED Final   CTX-M ESBL NOT DETECTED NOT DETECTED Final   Carbapenem resistance IMP NOT DETECTED NOT DETECTED Final    Carbapenem resistance KPC NOT DETECTED NOT DETECTED Final   Carbapenem resistance NDM NOT DETECTED NOT DETECTED Final   Carbapenem resist OXA 48 LIKE NOT DETECTED NOT DETECTED Final   Carbapenem resistance VIM NOT DETECTED NOT DETECTED Final    Comment: Performed at [facility], [address] [place], [place] [address]  Blood Culture (routine x 2)     Status: None (Preliminary result)   Collection Time: 12/15/23  4:30 PM   Specimen: BLOOD  Result Value Ref Range Status   Specimen Description   Final    BLOOD RIGHT ANTECUBITAL Performed at [facility], [address] [place], [place] [address]    Special Requests   Final    BOTTLES DRAWN AEROBIC AND ANAEROBIC Blood Culture results may not be optimal due to an inadequate volume of  blood received in culture bottles Performed at [facility], [address] [place], [place] [address]    Culture  Setup Time   Final    GRAM NEGATIVE RODS IN BOTH AEROBIC AND ANAEROBIC BOTTLES CRITICAL VALUE NOTED.  VALUE IS CONSISTENT WITH PREVIOUSLY REPORTED AND CALLED VALUE. Performed at [facility], [address] [place], [place] [address]    Culture GRAM NEGATIVE RODS  Final   Report Status PENDING  Incomplete  Urine Culture (for pregnant, neutropenic or urologic patients or patients with an indwelling urinary catheter)     Status: None   Collection Time: 12/15/23  5:40 PM   Specimen: Urine, Clean Catch  Result Value Ref Range Status   Specimen Description   Final    URINE, CLEAN CATCH Performed at [facility], [address] [place], [place] [address]    Special Requests   Final    NONE Performed at [facility], [address] [place], [place] [address]    Culture   Final    NO GROWTH Performed at [facility], [address], [place], [place] [address]    Report Status 12/16/2023 FINAL  Final  MRSA Next Gen by PCR, Nasal     Status: None   Collection Time:  12/16/23  5:28 PM   Specimen: Nasal Mucosa; Nasal Swab  Result Value Ref Range Status   MRSA by PCR Next Gen NOT DETECTED NOT DETECTED Final    Comment: (NOTE) The GeneXpert MRSA Assay (FDA approved for NASAL specimens only), is one component of a comprehensive MRSA colonization surveillance program. It is not intended to diagnose MRSA infection nor to guide or monitor treatment for MRSA infections. Test performance is not FDA approved in patients less than 76 years old. Performed at [facility], [address] [place], [place] [address]     Coagulation Studies: Recent Labs    12/15/23 1820  LABPROT 16.0*  INR 1.3*    Urinalysis: Recent Labs    12/15/23 1740  COLORURINE AMBER*  LABSPEC 1.017  PHURINE 7.0  GLUCOSEU NEGATIVE  HGBUR SMALL*  BILIRUBINUR NEGATIVE  KETONESUR NEGATIVE  PROTEINUR 100*  NITRITE NEGATIVE  LEUKOCYTESUR LARGE*      Imaging: MR ABDOMEN MRCP W WO CONTAST Result Date: 12/16/2023 CLINICAL DATA:  Right upper quadrant pain. Evaluate for biliary disease. EXAM: MRI ABDOMEN WITHOUT AND WITH CONTRAST (INCLUDING MRCP) TECHNIQUE: Multiplanar multisequence MR imaging of the abdomen was performed both before and after the administration of intravenous contrast. Heavily T2-weighted images of the biliary and pancreatic ducts were obtained, and three-dimensional MRCP images were rendered by post processing. CONTRAST:  7mL GADAVIST GADOBUTROL 1 MMOL/ML IV SOLN COMPARISON:  CT chest, abdomen and pelvis from 12/15/2023 FINDINGS: Note, exam detail is diminished due to motion artifact. Lower chest: Trace bilateral pleural effusions with overlying subpleural consolidation noted in both lung bases. . Hepatobiliary: Within the right lobe of liver there are multifocal areas of mural enhancing T2 hyperintense foci which are favored to represent dilated biliary radicles. The less dilated more central bile ducts within the right lobe have irregular appearance with  multiple focal areas of narrowing giving it a string of pearls like appearance. Status post cholecystectomy. The common bile duct is dilated measuring 2.1 cm proximally with be quite narrowing of the distal 7 mm of the CBD, image 8/14. Debris is noted within the CBD with a few small foci of signal void artifact measuring approximately 3 mm which may reflect  small stones versus signal void artifact. Pancreas: No mass, inflammatory changes, or other parenchymal abnormality identified. Spleen:  Within normal limits in size and appearance. Adrenals/Urinary Tract: Normal adrenal glands. Bilateral renal cortical atrophy. No hydronephrosis. Multiple bilateral renal cysts are identified. The largest arises off the upper pole of the left kidney measuring 3.7 x 2.9 cm. Stomach/Bowel: Stomach is normal. No dilated loops of bowel identified within the imaged portions of the abdomen. Vascular/Lymphatic: Normal caliber of the abdominal aorta. The upper abdominal vascularity appears patent. No adenopathy. Other:  No free fluid or fluid collections. Musculoskeletal: Within the right paramidline ventral abdominal wall there is a small subcutaneous nodule measuring 1.5 cm. This is T1 and T2 hyperintense without enhancement and is favored to represent a small hematoma. Possibly due to injection site. Subcutaneous edema is noted along both flanks. No enhancing bone abnormality. IMPRESSION: 1. Exam detail is diminished due to motion artifact. 2. Within the right lobe of liver there are multifocal areas of mural enhancing T2 hyperintense foci which are favored to represent dilated biliary radicles. The less dilated more central bile ducts within the right lobe have irregular appearance with multiple focal areas of narrowing giving it a string of pearls like appearance. In the acute setting imaging findings are concerning for cholangitis. Differential considerations include multiple peripheral liver abscesses. Underlying malignancy is  considered less favored. Follow-up imaging advised to ensure resolution following appropriate therapy. 3. Status post cholecystectomy. Common bile duct dilatation measures up to 2.1 cm. Abrupt focal narrowing of the distal 7 mm of the CBD is favored to represent underlying stricture possibly postinflammatory or secondary to previously passed stone. Debris is noted within the CBD with a few small foci of signal void artifact measuring approximately 3 mm which may reflect small stones versus signal void artifact. 4. Trace bilateral pleural effusions with overlying subpleural consolidation noted in both lung bases. 5. Bilateral renal cortical atrophy. 6. Small subcutaneous nodule within the right paramidline ventral abdominal wall is favored to represent a small hematoma. Possibly due to injection site. Electronically Signed   By: Kimberley Penman M.D.   On: 12/16/2023 04:45   MR 3D Recon At Scanner Result Date: 12/16/2023 CLINICAL DATA:  Right upper quadrant pain. Evaluate for biliary disease. EXAM: MRI ABDOMEN WITHOUT AND WITH CONTRAST (INCLUDING MRCP) TECHNIQUE: Multiplanar multisequence MR imaging of the abdomen was performed both before and after the administration of intravenous contrast. Heavily T2-weighted images of the biliary and pancreatic ducts were obtained, and three-dimensional MRCP images were rendered by post processing. CONTRAST:  7mL GADAVIST GADOBUTROL 1 MMOL/ML IV SOLN COMPARISON:  CT chest, abdomen and pelvis from 12/15/2023 FINDINGS: Note, exam detail is diminished due to motion artifact. Lower chest: Trace bilateral pleural effusions with overlying subpleural consolidation noted in both lung bases. . Hepatobiliary: Within the right lobe of liver there are multifocal areas of mural enhancing T2 hyperintense foci which are favored to represent dilated biliary radicles. The less dilated more central bile ducts within the right lobe have irregular appearance with multiple focal areas of narrowing  giving it a string of pearls like appearance. Status post cholecystectomy. The common bile duct is dilated measuring 2.1 cm proximally with be quite narrowing of the distal 7 mm of the CBD, image 8/14. Debris is noted within the CBD with a few small foci of signal void artifact measuring approximately 3 mm which may reflect small stones versus signal void artifact. Pancreas: No mass, inflammatory changes, or other parenchymal abnormality identified. Spleen:  Within  normal limits in size and appearance. Adrenals/Urinary Tract: Normal adrenal glands. Bilateral renal cortical atrophy. No hydronephrosis. Multiple bilateral renal cysts are identified. The largest arises off the upper pole of the left kidney measuring 3.7 x 2.9 cm. Stomach/Bowel: Stomach is normal. No dilated loops of bowel identified within the imaged portions of the abdomen. Vascular/Lymphatic: Normal caliber of the abdominal aorta. The upper abdominal vascularity appears patent. No adenopathy. Other:  No free fluid or fluid collections. Musculoskeletal: Within the right paramidline ventral abdominal wall there is a small subcutaneous nodule measuring 1.5 cm. This is T1 and T2 hyperintense without enhancement and is favored to represent a small hematoma. Possibly due to injection site. Subcutaneous edema is noted along both flanks. No enhancing bone abnormality. IMPRESSION: 1. Exam detail is diminished due to motion artifact. 2. Within the right lobe of liver there are multifocal areas of mural enhancing T2 hyperintense foci which are favored to represent dilated biliary radicles. The less dilated more central bile ducts within the right lobe have irregular appearance with multiple focal areas of narrowing giving it a string of pearls like appearance. In the acute setting imaging findings are concerning for cholangitis. Differential considerations include multiple peripheral liver abscesses. Underlying malignancy is considered less favored. Follow-up  imaging advised to ensure resolution following appropriate therapy. 3. Status post cholecystectomy. Common bile duct dilatation measures up to 2.1 cm. Abrupt focal narrowing of the distal 7 mm of the CBD is favored to represent underlying stricture possibly postinflammatory or secondary to previously passed stone. Debris is noted within the CBD with a few small foci of signal void artifact measuring approximately 3 mm which may reflect small stones versus signal void artifact. 4. Trace bilateral pleural effusions with overlying subpleural consolidation noted in both lung bases. 5. Bilateral renal cortical atrophy. 6. Small subcutaneous nodule within the right paramidline ventral abdominal wall is favored to represent a small hematoma. Possibly due to injection site. Electronically Signed   By: Kimberley Penman M.D.   On: 12/16/2023 04:45   CT Head Wo Contrast Result Date: 12/15/2023 CLINICAL DATA:  Mental status change, unknown cause EXAM: CT HEAD WITHOUT CONTRAST TECHNIQUE: Contiguous axial images were obtained from the base of the skull through the vertex without intravenous contrast. RADIATION DOSE REDUCTION: This exam was performed according to the departmental dose-optimization program which includes automated exposure control, adjustment of the mA and/or kV according to patient size and/or use of iterative reconstruction technique. COMPARISON:  Head CT 09/17/2023 FINDINGS: Brain: No intracranial hemorrhage, mass effect, or midline shift. Stable degree of atrophy. No hydrocephalus. The basilar cisterns are patent. Moderate to advanced periventricular and deep white matter hypodensity typical of chronic small vessel ischemia, unchanged. Remote lacunar infarct in the left basal ganglia. Remote left cerebellar infarcts. No evidence of territorial infarct or acute ischemia. No extra-axial or intracranial fluid collection. Vascular: Atherosclerosis of skullbase vasculature without hyperdense vessel or abnormal  calcification. Skull: No fracture or focal lesion. Sinuses/Orbits: No acute finding. Other: None. IMPRESSION: 1. No acute intracranial abnormality. 2. Stable atrophy, chronic small vessel ischemia, and remote infarcts. Electronically Signed   By: Chadwick Colonel M.D.   On: 12/15/2023 18:58   CT CHEST ABDOMEN PELVIS W CONTRAST Result Date: 12/15/2023 CLINICAL DATA:  Sepsis. EXAM: CT CHEST, ABDOMEN, AND PELVIS WITH CONTRAST TECHNIQUE: Multidetector CT imaging of the chest, abdomen and pelvis was performed following the standard protocol during bolus administration of intravenous contrast. RADIATION DOSE REDUCTION: This exam was performed according to the departmental dose-optimization program  which includes automated exposure control, adjustment of the mA and/or kV according to patient size and/or use of iterative reconstruction technique. CONTRAST:  OMNIPAQUE  IOHEXOL  300 MG/ML  SOLN COMPARISON:  CT of the abdomen pelvis dated 09/06/2023. FINDINGS: Evaluation of this exam is limited due to respiratory motion. CT CHEST FINDINGS Cardiovascular: There is no cardiomegaly or pericardial effusion. There is coronary vascular calcification and calcification of the mitral annulus. Moderate atherosclerotic calcification of the thoracic aorta. No aneurysmal dilatation or dissection. The central pulmonary arteries are grossly unremarkable. Right IJ catheter with tip in the right atrium. Mediastinum/Nodes: No obvious hilar or mediastinal adenopathy. The esophagus is grossly unremarkable. No mediastinal fluid collection. Lungs/Pleura: There is bibasilar subpleural atelectasis. Pneumonia is less likely but not excluded clinical correlation recommended. No pleural effusion or pneumothorax. The central airways are patent. Musculoskeletal: No acute osseous pathology. CT ABDOMEN PELVIS FINDINGS No intra-abdominal free air or free fluid. Hepatobiliary: There is diffuse biliary duct dilatation. Several small hypodense nodular  areas in the liver measure up to 15 mm in the right lobe. These are mild evaluated with concerning for intrahepatic abscesses versus less likely metastatic disease. Clinical correlation recommended. The common bile duct measures approximately 2 cm in diameter. No radiopaque calculus noted in the central CBD. Pancreas: Unremarkable. No pancreatic ductal dilatation or surrounding inflammatory changes. Spleen: Normal in size without focal abnormality. Adrenals/Urinary Tract: The right adrenal gland is unremarkable. There is a 2 cm left adrenal nodule: Not evaluated on this CT but present on the prior study. This was previously characterized as adenoma on CT of 11/18/2019. Moderate bilateral renal parenchyma atrophy. Small bilateral renal cysts and hypodense lesions which are too small to characterize. There is no hydronephrosis on either side. The urinary bladder is minimally distended. Stomach/Bowel: Percutaneous gastrostomy with balloon in the proximal body of the stomach. There is moderate to large amount of dense stool throughout the colon and within the rectal vault. There is no bowel obstruction or active inflammation. The appendix is normal. Vascular/Lymphatic: Advanced aortoiliac atherosclerotic disease. The IVC is unremarkable. No portal venous gas. There is no adenopathy. Reproductive: Hysterectomy.  No suspicious adnexal masses. Other: Midline anterior pelvic wall incisional scar. Ventral hernia repair mesh. Multiple subcutaneous nodules in the anterior abdominal wall likely related to subcutaneous injections. No fluid collection. Musculoskeletal: Osteopenia with degenerative changes of the spine. Lower lumbar posterior fusion. Severe arthritic changes of the right hip. No acute osseous pathology. IMPRESSION: 1. Diffuse biliary duct dilatation with several small hypodense nodular areas in the liver concerning for intrahepatic abscesses versus less likely metastatic disease. Further evaluation with MRI/MRCP  is recommended. 2. Bibasilar subpleural atelectasis. Pneumonia is less likely but not excluded. 3. 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 4. Constipation with possible fecal impaction in the rectal vault. No bowel obstruction. Normal appendix. 5.  Aortic Atherosclerosis (ICD10-I70.0). Electronically Signed   By: Angus Bark M.D.   On: 12/15/2023 18:57   DG Chest Port 1 View Result Date: 12/15/2023 CLINICAL DATA:  Question of sepsis to evaluate for abnormality. Dialysis patient. Altered mental status. Fever. EXAM: PORTABLE CHEST 1 VIEW COMPARISON:  09/21/2023 FINDINGS: Right central venous catheter with tip over the right atrium. Postoperative changes in the cervical spine. Shallow inspiration. Heart size and pulmonary vascularity are normal for technique. Lungs are clear. No pleural effusion or pneumothorax. Mediastinal contours appear intact. Degenerative changes in the spine. IMPRESSION: Shallow inspiration.  No evidence of active pulmonary disease. Electronically Signed   By:  Boyce Byes M.D.   On: 12/15/2023 17:55     Medications:    [MAR Hold] cefTRIAXone (ROCEPHIN)  IV Stopped (12/16/23 0753)   lactated ringers  40 mL/hr at 12/17/23 0952   [MAR Hold] metronidazole Stopped (12/16/23 2340)    [MAR Hold] aspirin   81 mg Per Tube Daily   [MAR Hold] Chlorhexidine  Gluconate Cloth  6 each Topical Q0600   [MAR Hold] cloBAZam  5 mg Per Tube Q12H   diclofenac   100 mg Rectal Once   [MAR Hold] heparin   5,000 Units Subcutaneous Q8H   [MAR Hold] lacosamide  200 mg Per Tube BID   [MAR Hold] levETIRAcetam  500 mg Per Tube BID   [MAR Hold] mupirocin ointment  1 Application Topical BID   [MAR Hold] nicotine   21 mg Transdermal Daily   [MAR Hold] omeprazole   20 mg Oral Daily   [MAR Hold] sertraline  25 mg Oral Daily   [MAR Hold] sevelamer carbonate  800 mg Per Tube TID WC   [MAR Hold] sodium hypochlorite  1 Application Irrigation Daily   [MAR Hold] hydrALAZINE ,  [MAR Hold] hydrOXYzine , [MAR Hold] ibuprofen , [MAR Hold] LORazepam , [MAR Hold] ondansetron  (ZOFRAN ) IV, [MAR Hold] oxyCODONE , [MAR Hold] senna-docusate  Assessment/ Plan:  [name] is a 67 y.o.  female with past medical conditions including seizures, chronic biliary duct dilation, CHF, hypertension, stroke, diastolic heart failure, depression anxiety, bipolar, anemia, breast cancer status post right mastectomy, polypharmacy, end-stage renal disease on hemodialysis, who was admitted to [facility] on 12/15/2023 for  Elevated LFTs [R79.89] Liver lesion [K76.9] Dilation of biliary tract [K83.8] Sepsis (HCC) [A41.9] Urinary tract infection without hematuria, site unspecified [N39.0] Altered mental status, unspecified altered mental status type [R41.82] Sepsis, due to unspecified organism, unspecified whether acute organ dysfunction present (HCC) [A41.9]   End stage renal disease on hemodialysis. Dialysis received yesterday, UF 0. Next treatment scheduled for Saturday.   2. Sepsis secondary to UTI and sacral wound infection. Blood culture positive for E coli and Enterobacterales. Prescribed Vancomycin , Ceftriaxone and metronidazole.   3. Chronic biliary dilation with suspected cholangitis and liver abscess. CT abdomen and pelvis with contrast shows diffuse biliary duct dilatation with hypodense nodular areas in the liver concerning for intrahepatic abscesses versus metastatic disease. GI planning ERCP today to evaluate.   4. Anemia of chronic kidney disease Lab Results  Component Value Date   HGB 7.9 (L) 12/17/2023    Hgb has improved with blood transfusion. Due to breast cancer history, will avoid ESA's.   5. Secondary Hyperparathyroidism: with outpatient labs: PTH 354 on 09/01/23.    Lab Results  Component Value Date   CALCIUM  8.7 (L) 12/17/2023   CAION 1.13 (L) 05/25/2023   PHOS 6.2 (H) 05/26/2023    Patient prescribed auryxia and sevelamer outpatient. Will continue to monitor bone  minerals during this admission   6. Hypertension with chronic kidney disease. Home regimen includes carvedilol , and hydralazine . Held at this time. Blood pressure stable   LOS: 2 Brunette Lavalle 6/6/202510:28 AM

## 2023-12-18 DIAGNOSIS — A419 Sepsis, unspecified organism: Secondary | ICD-10-CM | POA: Diagnosis not present

## 2023-12-18 DIAGNOSIS — R6520 Severe sepsis without septic shock: Secondary | ICD-10-CM | POA: Diagnosis not present

## 2023-12-18 LAB — CBC WITH DIFFERENTIAL/PLATELET
Abs Immature Granulocytes: 0.15 10*3/uL — ABNORMAL HIGH (ref 0.00–0.07)
Basophils Absolute: 0 10*3/uL (ref 0.0–0.1)
Basophils Relative: 0 %
Eosinophils Absolute: 0.1 10*3/uL (ref 0.0–0.5)
Eosinophils Relative: 0 %
HCT: 25.5 % — ABNORMAL LOW (ref 36.0–46.0)
Hemoglobin: 8.2 g/dL — ABNORMAL LOW (ref 12.0–15.0)
Immature Granulocytes: 1 %
Lymphocytes Relative: 8 %
Lymphs Abs: 1.3 10*3/uL (ref 0.7–4.0)
MCH: 28.6 pg (ref 26.0–34.0)
MCHC: 32.2 g/dL (ref 30.0–36.0)
MCV: 88.9 fL (ref 80.0–100.0)
Monocytes Absolute: 0.6 10*3/uL (ref 0.1–1.0)
Monocytes Relative: 4 %
Neutro Abs: 14 10*3/uL — ABNORMAL HIGH (ref 1.7–7.7)
Neutrophils Relative %: 87 %
Platelets: 407 10*3/uL — ABNORMAL HIGH (ref 150–400)
RBC: 2.87 MIL/uL — ABNORMAL LOW (ref 3.87–5.11)
RDW: 17.1 % — ABNORMAL HIGH (ref 11.5–15.5)
WBC: 16.2 10*3/uL — ABNORMAL HIGH (ref 4.0–10.5)
nRBC: 0 % (ref 0.0–0.2)

## 2023-12-18 LAB — COMPREHENSIVE METABOLIC PANEL WITH GFR
ALT: 38 U/L (ref 0–44)
AST: 128 U/L — ABNORMAL HIGH (ref 15–41)
Albumin: 2 g/dL — ABNORMAL LOW (ref 3.5–5.0)
Alkaline Phosphatase: 775 U/L — ABNORMAL HIGH (ref 38–126)
Anion gap: 13 (ref 5–15)
BUN: 51 mg/dL — ABNORMAL HIGH (ref 8–23)
CO2: 27 mmol/L (ref 22–32)
Calcium: 8.8 mg/dL — ABNORMAL LOW (ref 8.9–10.3)
Chloride: 99 mmol/L (ref 98–111)
Creatinine, Ser: 3.38 mg/dL — ABNORMAL HIGH (ref 0.44–1.00)
GFR, Estimated: 14 mL/min — ABNORMAL LOW (ref >60)
Glucose, Bld: 92 mg/dL (ref 70–99)
Potassium: 3.7 mmol/L (ref 3.5–5.1)
Sodium: 139 mmol/L (ref 135–145)
Total Bilirubin: 0.9 mg/dL (ref 0.0–1.2)
Total Protein: 6.7 g/dL (ref 6.5–8.1)

## 2023-12-18 LAB — CULTURE, BLOOD (ROUTINE X 2)

## 2023-12-18 MED ORDER — HEPARIN SODIUM (PORCINE) 1000 UNIT/ML IJ SOLN
INTRAMUSCULAR | Status: AC
  Filled 2023-12-18: qty 10

## 2023-12-18 NOTE — Progress Notes (Signed)
 Progress Note    Sherry [name]  XLK:[id number] DOB: 09-17-1955  DOA: 12/15/2023 PCP: Tita Form, MD      Brief Narrative:    Medical records reviewed and are as summarized below:  Sherry [name] is a 68 y.o. female with medical history significant for ESRD on HD (on MWF schedule), seizure, chronic biliary duct dilation in the setting of a cholecystectomy which is felt to be physiological and normal per GI consult (this is documented by Dr. Gordon Latus on 02/24/22), chronic hepatitis C, opioid use disorder, stroke, s/p of gastrostomy, hypertension, diastolic CHF, stroke, depression with anxiety, bipolar, anemia,HCV, breast cancer (s/p right mastectomy), polypharmacy.  She was previously hospitalized at [facility] in February 2025 for E. coli pneumonia and bacteremia, cerebellar stroke and tonic-clonic seizures in the setting of severe PRES that required intubation and subsequent tracheostomy and PEG tube placement.  She presented to the hospital because of fever and altered mental status.  Reportedly, she was noted to be confused and she spiked a temperature with fever of 102.4 F on the day of admission.   ED Course: pt was found to have WBC 18.2, lactic acid of 1.4 --> 1.1, potassium 3.6, positive UA (turbid appearance, large amount of leukocyte, many bacteria, WBC > 50, squamous cell 11-20), negative PCR for COVID, flu and RSV, ammonia level<13, troponin 25 --> 22, abnormal liver function (ALP 1035, AST 214, ALT 67, total bilirubin 0.9). Blood pressure 150/72 --> 119/58, heart rate 95, RR 21, oxygen saturation 94% on room air.  Chest x-ray negative.  CT of head negative.  Patient is admitted to PCU as inpatient.  Dr. Zelda Hickman of renal is consulted.   CT of abdomen/pelvis/chest: 1. Diffuse biliary duct dilatation with several small hypodense nodular areas in the liver concerning for intrahepatic abscesses versus less likely metastatic disease. Further evaluation with MRI/MRCP is recommended. 2.  Bibasilar subpleural atelectasis. Pneumonia is less likely but not excluded. 3. 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 4. Constipation with possible fecal impaction in the rectal vault. No bowel obstruction. Normal appendix. 5.  Aortic Atherosclerosis (ICD10-I70.0).   MRCP:    IMPRESSION: 1. Exam detail is diminished due to motion artifact. 2. Within the right lobe of liver there are multifocal areas of mural enhancing T2 hyperintense foci which are favored to represent dilated biliary radicles. The less dilated more central bile ducts within the right lobe have irregular appearance with multiple focal areas of narrowing giving it a string of pearls like appearance. In the acute setting imaging findings are concerning for cholangitis. Differential considerations include multiple peripheral liver abscesses. Underlying malignancy is considered less favored. Follow-up imaging advised to ensure resolution following appropriate therapy. 3. Status post cholecystectomy. Common bile duct dilatation measures up to 2.1 cm. Abrupt focal narrowing of the distal 7 mm of the CBD is favored to represent underlying stricture possibly postinflammatory or secondary to previously passed stone. Debris is noted within the CBD with a few small foci of signal void artifact measuring approximately 3 mm which may reflect small stones versus signal void artifact. 4. Trace bilateral pleural effusions with overlying subpleural consolidation noted in both lung bases. 5. Bilateral renal cortical atrophy. 6. Small subcutaneous nodule within the right paramidline ventral abdominal wall is favored to represent a small hematoma. Possibly due to injection site.    She was admitted to the hospital for severe sepsis from E. coli bacteremia, probable acute UTI and suspected acute cholangitis.  Assessment/Plan:   Principal Problem:   Severe sepsis (HCC) Active Problems:    Liver lesion   Abnormal liver function   UTI (urinary tract infection)   Acute metabolic encephalopathy   ESRD on dialysis [facility])   Essential hypertension   Chronic diastolic CHF (congestive heart failure) (HCC)   Stroke (HCC)   Seizure (HCC)   Cigarette smoker   Hx of bipolar disorder   Sacral wound   E coli bacteremia   Cholangitis   Calculus of bile duct without cholecystitis with obstruction    Body mass index is 22.98 kg/m.   Severe sepsis, E. coli bacteremia, probable acute UTI, probable acute cholangitis, leukocytosis:  4 out of 4 blood cultures positive for E. coli.   No growth on urine culture Sacral wound culture grew abundant Klebsiella pneumoniae and few vascular show E. coli.  Both are sensitive to ceftriaxone . Continue IV ceftriaxone  and follow-up with ID for further recommendations.   Chronic biliary dilatation from previous cholecystectomy, suspected acute cholangitis, suspected multiple peripheral liver abscesses on MRCP, chronically elevated liver enzymes, chronic hepatitis C:  S/p ERCP on 12/2023.  "A filling defect was seen on the cholangiogram.  Multiple biliary strictures were found in the entire biliary tree.  Strictures were inflammatory.  Choledocholithiasis was found.  Complete removal was accomplished by biliary sphincterotomy and balloon extraction.  A biliary sphincterectomy was performed.  The biliary tree was swept and nothing was found.  1 plastic stent was placed into the common bile duct".   Acute metabolic encephalopathy: Improved.  She is back to baseline.  Likely due to infectious process.  CT head did not show any acute stroke.   Acute on chronic anemia: Hemoglobin up from 6.7-7.9-8.2. S/p transfusion of 1 unit of PRBCs on 12/16/2023.   Monitor H&H and transfuse as needed.   ESRD on TTS schedule: Follow-up with nephrologist.  Chronic diastolic CHF: Compensated   Seizure disorder: Continue clobazam , Vimpat  and Keppra .   Sacral  decubitus ulcer (stage III versus stage IV): Present on admission.  According to [facility], patient's niece, wound was contaminated with fecal material about a week prior to admission. Consulted Dr. Ofilia Benton, general surgeon, to see if patient needs wound VAC.   History of stroke, dysphagia with gastrostomy tube in place Patient is on dysphagia 1 diet (confirmed by [place], niece).     Diet Order             DIET - DYS 1 Room service appropriate? Yes; Fluid consistency: Thin  Diet effective now                            Consultants: ID specialist Nephrologist Gastroenterologist  Procedures: ERCP 12/17/2023    Medications:    aspirin   81 mg Per Tube Daily   Chlorhexidine  Gluconate Cloth  6 each Topical Q0600   cloBAZam   5 mg Per Tube Q12H   diclofenac   100 mg Rectal Once   feeding supplement  237 mL Oral BID BM   heparin   5,000 Units Subcutaneous Q8H   lacosamide   200 mg Per Tube BID   levETIRAcetam   500 mg Per Tube BID   mupirocin  ointment  1 Application Topical BID   nicotine   21 mg Transdermal Daily   omeprazole   20 mg Oral Daily   sertraline   25 mg Oral Daily   sevelamer  carbonate  800 mg Per Tube TID WC   sodium hypochlorite  1 Application Irrigation Daily  Continuous Infusions:  cefTRIAXone  (ROCEPHIN )  IV 2 g (12/18/23 0624)   metronidazole  500 mg (12/17/23 2129)     Anti-infectives (From admission, onward)    Start     Dose/Rate Route Frequency Ordered Stop   12/16/23 2200  ceFEPIme  (MAXIPIME ) 1 g in sodium chloride  0.9 % 100 mL IVPB  Status:  Discontinued        1 g 200 mL/hr over 30 Minutes Intravenous Every 24 hours 12/15/23 2037 12/16/23 0549   12/16/23 1800  metroNIDAZOLE  (FLAGYL ) IVPB 500 mg        500 mg 100 mL/hr over 60 Minutes Intravenous Every 12 hours 12/16/23 1511     12/16/23 1200  vancomycin  (VANCOREADY) IVPB 750 mg/150 mL  Status:  Discontinued        750 mg 150 mL/hr over 60 Minutes Intravenous Every T-Th-Sa (Hemodialysis)  12/15/23 2312 12/16/23 0549   12/16/23 0600  metroNIDAZOLE  (FLAGYL ) IVPB 500 mg  Status:  Discontinued        500 mg 100 mL/hr over 60 Minutes Intravenous Every 12 hours 12/15/23 2035 12/16/23 0549   12/16/23 0600  cefTRIAXone  (ROCEPHIN ) 2 g in sodium chloride  0.9 % 100 mL IVPB        2 g 200 mL/hr over 30 Minutes Intravenous Every 24 hours 12/16/23 0553     12/15/23 2245  vancomycin  (VANCOREADY) IVPB 500 mg/100 mL        500 mg 100 mL/hr over 60 Minutes Intravenous  Once 12/15/23 2236 12/16/23 0532   12/15/23 1600  ceFEPIme  (MAXIPIME ) 2 g in sodium chloride  0.9 % 100 mL IVPB        2 g 200 mL/hr over 30 Minutes Intravenous  Once 12/15/23 1548 12/15/23 1819   12/15/23 1600  metroNIDAZOLE  (FLAGYL ) IVPB 500 mg        500 mg 100 mL/hr over 60 Minutes Intravenous  Once 12/15/23 1548 12/15/23 1955   12/15/23 1600  vancomycin  (VANCOCIN ) IVPB 1000 mg/200 mL premix        1,000 mg 200 mL/hr over 60 Minutes Intravenous  Once 12/15/23 1548 12/15/23 2128              Family Communication/Anticipated D/C date and plan/Code Status   DVT prophylaxis: heparin  injection 5,000 Units Start: 12/15/23 2200     Code Status: Full Code  Family Communication: Plan with Avis Lemming, niece, over the phone  Disposition Plan: Plan to discharge to SNF   Status is: Inpatient Remains inpatient appropriate because: Sepsis, E. coli bacteremia       Subjective:   Interval events noted.  She said she wanted to terminate dialysis session prematurely.  She said she did not know what was going on with her and was confused about recent events.  Dialysis nurse and nurse tech were at the bedside.  Objective:    Vitals:   12/18/23 1000 12/18/23 1030 12/18/23 1100 12/18/23 1130  BP: (!) 146/62 135/85 (!) 113/98 (!) 139/103  Pulse: 76 77 84 84  Resp: 12 12 10 15   Temp:      TempSrc:      SpO2: 100% 100% 100% 100%  Weight:      Height:       No data found.  No intake or output data in the 24 hours  ending 12/18/23 1151  Filed Weights   12/17/23 0500 12/18/23 0500 12/18/23 0811  Weight: 62.3 kg 59.9 kg 57 kg    Exam:  GEN: NAD SKIN: Warm and dry EYES: No  pallor or icterus ENT: MMM CV: RRR PULM: CTA B ABD: soft, ND, NT, +BS, gastrostomy tube in place CNS: AAO x 3, non focal, abnormal involuntary movements of the mouth likely from tardive dyskinesia EXT: No edema or tenderness       Data Reviewed:   I have personally reviewed following labs and imaging studies:  Labs: Labs show the following:   Basic Metabolic Panel: Recent Labs  Lab 12/15/23 1819 12/16/23 0532 12/17/23 0610 12/18/23 0442  NA 142 139 138 139  K 3.6 3.6 3.5 3.7  CL 103 102 100 99  CO2 26 27 27 27   GLUCOSE 139* 110* 83 92  BUN 45* 53* 31* 51*  CREATININE 3.61* 4.13* 2.53* 3.38*  CALCIUM  8.8* 9.0 8.7* 8.8*   GFR Estimated Creatinine Clearance: 12.6 mL/min (A) (by C-G formula based on SCr of 3.38 mg/dL (H)). Liver Function Tests: Recent Labs  Lab 12/15/23 1819 12/16/23 0532 12/17/23 0610 12/18/23 0442  AST 214* 184* 137* 128*  ALT 67* 61* 44 38  ALKPHOS 1,035* 974* 829* 775*  BILITOT 0.9 1.4* 1.3* 0.9  PROT 6.9 6.9 6.5 6.7  ALBUMIN 2.1* 2.1* 2.0* 2.0*   No results for input(s): "LIPASE", "AMYLASE" in the last 168 hours. Recent Labs  Lab 12/15/23 1819  AMMONIA <13   Coagulation profile Recent Labs  Lab 12/15/23 1820  INR 1.3*    CBC: Recent Labs  Lab 12/15/23 1819 12/16/23 0532 12/17/23 0610 12/18/23 0442  WBC 18.2* 21.3* 21.0* 16.2*  NEUTROABS 15.8*  --   --  14.0*  HGB 7.7* 6.7* 7.9* 8.2*  HCT 24.8* 22.3* 24.8* 25.5*  MCV 92.5 93.7 89.9 88.9  PLT 414* 415* 438* 407*   Cardiac Enzymes: Recent Labs  Lab 12/15/23 1819  CKTOTAL 11*   BNP (last 3 results) No results for input(s): "PROBNP" in the last 8760 hours. CBG: Recent Labs  Lab 12/16/23 0917  GLUCAP 111*   D-Dimer: No results for input(s): "DDIMER" in the last 72 hours. Hgb A1c: No results for  input(s): "HGBA1C" in the last 72 hours. Lipid Profile: No results for input(s): "CHOL", "HDL", "LDLCALC", "TRIG", "CHOLHDL", "LDLDIRECT" in the last 72 hours. Thyroid function studies: No results for input(s): "TSH", "T4TOTAL", "T3FREE", "THYROIDAB" in the last 72 hours.  Invalid input(s): "FREET3" Anemia work up: Recent Labs    12/16/23 0532  FERRITIN 3,187*  TIBC 158*  IRON 7*   Sepsis Labs: Recent Labs  Lab 12/15/23 1630 12/15/23 1819 12/16/23 0532 12/17/23 0610 12/18/23 0442  WBC  --  18.2* 21.3* 21.0* 16.2*  LATICACIDVEN 1.4 1.1  --   --   --     Microbiology Recent Results (from the past 240 hours)  Resp panel by RT-PCR (RSV, Flu A&B, Covid) Anterior Nasal Swab     Status: None   Collection Time: 12/15/23  4:11 PM   Specimen: Anterior Nasal Swab  Result Value Ref Range Status   SARS Coronavirus 2 by RT PCR NEGATIVE NEGATIVE Final    Comment: (NOTE) SARS-CoV-2 target nucleic acids are NOT DETECTED.  The SARS-CoV-2 RNA is generally detectable in upper respiratory specimens during the acute phase of infection. The lowest concentration of SARS-CoV-2 viral copies this assay can detect is 138 copies/mL. A negative result does not preclude SARS-Cov-2 infection and should not be used as the sole basis for treatment or other patient management decisions. A negative result may occur with  improper specimen collection/handling, submission of specimen other than nasopharyngeal swab, presence of viral mutation(s)  within the areas targeted by this assay, and inadequate number of viral copies(<138 copies/mL). A negative result must be combined with clinical observations, patient history, and epidemiological information. The expected result is Negative.  Fact Sheet for Patients:  BloggerCourse.com  Fact Sheet for Healthcare Providers:  SeriousBroker.it  This test is no t yet approved or cleared by the [place]  FDA  and  has been authorized for detection and/or diagnosis of SARS-CoV-2 by FDA under an Emergency Use Authorization (EUA). This EUA will remain  in effect (meaning this test can be used) for the duration of the COVID-19 declaration under Section 564(b)(1) of the Act, 21 U.S.C.section 360bbb-3(b)(1), unless the authorization is terminated  or revoked sooner.       Influenza A by PCR NEGATIVE NEGATIVE Final   Influenza B by PCR NEGATIVE NEGATIVE Final    Comment: (NOTE) The Xpert Xpress SARS-CoV-2/FLU/RSV plus assay is intended as an aid in the diagnosis of influenza from Nasopharyngeal swab specimens and should not be used as a sole basis for treatment. Nasal washings and aspirates are unacceptable for Xpert Xpress SARS-CoV-2/FLU/RSV testing.  Fact Sheet for Patients: BloggerCourse.com  Fact Sheet for Healthcare Providers: SeriousBroker.it  This test is not yet approved or cleared by the [place]  FDA and has been authorized for detection and/or diagnosis of SARS-CoV-2 by FDA under an Emergency Use Authorization (EUA). This EUA will remain in effect (meaning this test can be used) for the duration of the COVID-19 declaration under Section 564(b)(1) of the Act, 21 U.S.C. section 360bbb-3(b)(1), unless the authorization is terminated or revoked.     Resp Syncytial Virus by PCR NEGATIVE NEGATIVE Final    Comment: (NOTE) Fact Sheet for Patients: BloggerCourse.com  Fact Sheet for Healthcare Providers: SeriousBroker.it  This test is not yet approved or cleared by the [place]  FDA and has been authorized for detection and/or diagnosis of SARS-CoV-2 by FDA under an Emergency Use Authorization (EUA). This EUA will remain in effect (meaning this test can be used) for the duration of the COVID-19 declaration under Section 564(b)(1) of the Act, 21 U.S.C. section 360bbb-3(b)(1),  unless the authorization is terminated or revoked.  Performed at [facility], [address], [place], [place] [address]   Blood Culture (routine x 2)     Status: Abnormal   Collection Time: 12/15/23  4:12 PM   Specimen: BLOOD  Result Value Ref Range Status   Specimen Description   Final    BLOOD RIGHT ANTECUBITAL Performed at [facility], [address] [place], [place] [address]    Special Requests   Final    BOTTLES DRAWN AEROBIC AND ANAEROBIC Blood Culture results may not be optimal due to an inadequate volume of blood received in culture bottles Performed at [facility], [address] [place], [place] [address]    Culture  Setup Time   Final    GRAM NEGATIVE RODS IN BOTH AEROBIC AND ANAEROBIC BOTTLES Organism ID to follow CRITICAL RESULT CALLED TO, READ BACK BY AND VERIFIED WITHLatina Pol AT 2956 12/16/23 JG Performed at [facility], [address] [place], [place] [address]    Culture ESCHERICHIA COLI (A)  Final   Report Status 12/18/2023 FINAL  Final   Organism ID, Bacteria ESCHERICHIA COLI  Final   Organism ID, Bacteria ESCHERICHIA COLI  Final      Susceptibility   Escherichia coli - KIRBY BAUER*    CEFAZOLIN  RESISTANT Resistant    Escherichia coli - MIC*  AMPICILLIN >=32 RESISTANT Resistant     CEFEPIME  <=0.12 SENSITIVE Sensitive     CEFTAZIDIME <=1 SENSITIVE Sensitive     CEFTRIAXONE  <=0.25 SENSITIVE Sensitive     CIPROFLOXACIN >=4 RESISTANT Resistant     GENTAMICIN <=1 SENSITIVE Sensitive     IMIPENEM <=0.25 SENSITIVE Sensitive     TRIMETH /SULFA  <=20 SENSITIVE Sensitive     AMPICILLIN/SULBACTAM >=32 RESISTANT Resistant     PIP/TAZO <=4 SENSITIVE Sensitive ug/mL    * ESCHERICHIA COLI    ESCHERICHIA COLI  Aerobic/Anaerobic Culture w Gram Stain (surgical/deep wound)     Status: None (Preliminary result)   Collection Time: 12/15/23  4:12 PM   Specimen: Wound  Result Value Ref Range Status   Specimen  Description   Final    WOUND Performed at [facility], [address], [place], [place] [address]    Special Requests   Final    SACRAL Performed at [facility], [address] [place], [place] [address]    Gram Stain NO WBC SEEN RARE GRAM NEGATIVE RODS   Final   Culture   Final    ABUNDANT KLEBSIELLA PNEUMONIAE FEW ESCHERICHIA COLI HOLDING FOR POSSIBLE ANAEROBE Performed at [facility], [address], [place], [place] [address]    Report Status PENDING  Incomplete   Organism ID, Bacteria KLEBSIELLA PNEUMONIAE  Final   Organism ID, Bacteria ESCHERICHIA COLI  Final      Susceptibility   Escherichia coli - MIC*    AMPICILLIN >=32 RESISTANT Resistant     CEFEPIME  <=0.12 SENSITIVE Sensitive     CEFTAZIDIME <=1 SENSITIVE Sensitive     CEFTRIAXONE  <=0.25 SENSITIVE Sensitive     CIPROFLOXACIN >=4 RESISTANT Resistant     GENTAMICIN <=1 SENSITIVE Sensitive     IMIPENEM <=0.25 SENSITIVE Sensitive     TRIMETH /SULFA  <=20 SENSITIVE Sensitive     AMPICILLIN/SULBACTAM >=32 RESISTANT Resistant     PIP/TAZO <=4 SENSITIVE Sensitive ug/mL    * FEW ESCHERICHIA COLI   Klebsiella pneumoniae - MIC*    AMPICILLIN RESISTANT Resistant     CEFEPIME  <=0.12 SENSITIVE Sensitive     CEFTAZIDIME <=1 SENSITIVE Sensitive     CEFTRIAXONE  <=0.25 SENSITIVE Sensitive     CIPROFLOXACIN <=0.25 SENSITIVE Sensitive     GENTAMICIN <=1 SENSITIVE Sensitive     IMIPENEM <=0.25 SENSITIVE Sensitive     TRIMETH /SULFA  <=20 SENSITIVE Sensitive     AMPICILLIN/SULBACTAM <=2 SENSITIVE Sensitive     PIP/TAZO <=4 SENSITIVE Sensitive ug/mL    * ABUNDANT KLEBSIELLA PNEUMONIAE  Blood Culture ID Panel (Reflexed)     Status: Abnormal   Collection Time: 12/15/23  4:12 PM  Result Value Ref Range Status   Enterococcus faecalis NOT DETECTED NOT DETECTED Final   Enterococcus Faecium NOT DETECTED NOT DETECTED Final   Listeria monocytogenes NOT DETECTED NOT DETECTED Final   Staphylococcus species NOT  DETECTED NOT DETECTED Final   Staphylococcus aureus (BCID) NOT DETECTED NOT DETECTED Final   Staphylococcus epidermidis NOT DETECTED NOT DETECTED Final   Staphylococcus lugdunensis NOT DETECTED NOT DETECTED Final   Streptococcus species NOT DETECTED NOT DETECTED Final   Streptococcus agalactiae NOT DETECTED NOT DETECTED Final   Streptococcus pneumoniae NOT DETECTED NOT DETECTED Final   Streptococcus pyogenes NOT DETECTED NOT DETECTED Final   A.calcoaceticus-baumannii NOT DETECTED NOT DETECTED Final   Bacteroides fragilis NOT DETECTED NOT DETECTED Final   Enterobacterales DETECTED (A) NOT DETECTED Final    Comment: Enterobacterales represent a large order of gram negative bacteria, not  a single organism. CRITICAL RESULT CALLED TO, READ BACK BY AND VERIFIED WITH:  JASON ROBINS AT 1610 12/16/23 JG    Enterobacter cloacae complex NOT DETECTED NOT DETECTED Final   Escherichia coli DETECTED (A) NOT DETECTED Final    Comment: CRITICAL RESULT CALLED TO, READ BACK BY AND VERIFIED WITH:  JASON ROBINS AT 0529 12/16/23 JG    Klebsiella aerogenes NOT DETECTED NOT DETECTED Final   Klebsiella oxytoca NOT DETECTED NOT DETECTED Final   Klebsiella pneumoniae NOT DETECTED NOT DETECTED Final   Proteus species NOT DETECTED NOT DETECTED Final   Salmonella species NOT DETECTED NOT DETECTED Final   Serratia marcescens NOT DETECTED NOT DETECTED Final   Haemophilus influenzae NOT DETECTED NOT DETECTED Final   Neisseria meningitidis NOT DETECTED NOT DETECTED Final   Pseudomonas aeruginosa NOT DETECTED NOT DETECTED Final   Stenotrophomonas maltophilia NOT DETECTED NOT DETECTED Final   Candida albicans NOT DETECTED NOT DETECTED Final   Candida auris NOT DETECTED NOT DETECTED Final   Candida glabrata NOT DETECTED NOT DETECTED Final   Candida krusei NOT DETECTED NOT DETECTED Final   Candida parapsilosis NOT DETECTED NOT DETECTED Final   Candida tropicalis NOT DETECTED NOT DETECTED Final   Cryptococcus  neoformans/gattii NOT DETECTED NOT DETECTED Final   CTX-M ESBL NOT DETECTED NOT DETECTED Final   Carbapenem resistance IMP NOT DETECTED NOT DETECTED Final   Carbapenem resistance KPC NOT DETECTED NOT DETECTED Final   Carbapenem resistance NDM NOT DETECTED NOT DETECTED Final   Carbapenem resist OXA 48 LIKE NOT DETECTED NOT DETECTED Final   Carbapenem resistance VIM NOT DETECTED NOT DETECTED Final    Comment: Performed at [facility], [address] [place], [place] [address]  Blood Culture (routine x 2)     Status: Abnormal   Collection Time: 12/15/23  4:30 PM   Specimen: BLOOD  Result Value Ref Range Status   Specimen Description   Final    BLOOD RIGHT ANTECUBITAL Performed at [facility], [address] [place], [place] [address]    Special Requests   Final    BOTTLES DRAWN AEROBIC AND ANAEROBIC Blood Culture results may not be optimal due to an inadequate volume of blood received in culture bottles Performed at [facility], [address] [place], [place] [address]    Culture  Setup Time   Final    GRAM NEGATIVE RODS IN BOTH AEROBIC AND ANAEROBIC BOTTLES CRITICAL VALUE NOTED.  VALUE IS CONSISTENT WITH PREVIOUSLY REPORTED AND CALLED VALUE. Performed at [facility], [address] [place], [place] [address]    Culture (A)  Final    ESCHERICHIA COLI SUSCEPTIBILITIES PERFORMED ON PREVIOUS CULTURE WITHIN THE LAST 5 DAYS. Performed at [facility], [address], [place], [place] [address]    Report Status 12/18/2023 FINAL  Final  Urine Culture (for pregnant, neutropenic or urologic patients or patients with an indwelling urinary catheter)     Status: None   Collection Time: 12/15/23  5:40 PM   Specimen: Urine, Clean Catch  Result Value Ref Range Status   Specimen Description   Final    URINE, CLEAN CATCH Performed at [facility], [address], [place], [place] [address]    Special Requests   Final     NONE Performed at [facility], [address], [place], [place] [address]    Culture   Final    NO GROWTH Performed at [facility], [address] [place],  [place] [address]    Report Status 12/16/2023 FINAL  Final  MRSA Next Gen by PCR, Nasal     Status: None   Collection Time: 12/16/23  5:28 PM   Specimen: Nasal Mucosa; Nasal Swab  Result Value Ref Range Status   MRSA by PCR Next Gen NOT DETECTED NOT DETECTED Final    Comment: (NOTE) The GeneXpert MRSA Assay (FDA approved for NASAL specimens only), is one component of a comprehensive MRSA colonization surveillance program. It is not intended to diagnose MRSA infection nor to guide or monitor treatment for MRSA infections. Test performance is not FDA approved in patients less than 11 years old. Performed at [facility], [address] [place], [place] [address]     Procedures and diagnostic studies:  DG C-Arm 1-60 Min-No Report Result Date: 12/17/2023 Fluoroscopy was utilized by the requesting physician.  No radiographic interpretation.               LOS: 3 days   Dhwani Venkatesh  [facility]   Pager on [email or web address]. If 7PM-7AM, please contact night-coverage at [email or web address]     12/18/2023, 11:51 AM

## 2023-12-18 NOTE — Progress Notes (Signed)
 [facility]  PROGRESS NOTE   Subjective:   Patient seen on dialysis.  Tolerating treatment well.  Objective:  Vital signs: Blood pressure (!) 122/104, pulse 85, temperature 98.4 F (36.9 C), temperature source Oral, resp. rate 16, height 5\' 2"  (1.575 m), weight 56 kg, SpO2 100%. No intake or output data in the 24 hours ending 12/18/23 1404 Filed Weights   12/18/23 0500 12/18/23 0811 12/18/23 1208  Weight: 59.9 kg 57 kg 56 kg     Physical Exam: General:  No acute distress  Head:  Normocephalic, atraumatic. Moist oral mucosal membranes  Eyes:  Anicteric  Neck:  Supple  Lungs:   Clear to auscultation, normal effort  Heart:  S1S2 no rubs  Abdomen:   Soft, nontender, bowel sounds present  Extremities:  peripheral edema.  Neurologic:  Awake, alert, following commands  Skin:  No lesions  Access:     Basic Metabolic Panel: Recent Labs  Lab 12/15/23 1819 12/16/23 0532 12/17/23 0610 12/18/23 0442  NA 142 139 138 139  K 3.6 3.6 3.5 3.7  CL 103 102 100 99  CO2 26 27 27 27   GLUCOSE 139* 110* 83 92  BUN 45* 53* 31* 51*  CREATININE 3.61* 4.13* 2.53* 3.38*  CALCIUM  8.8* 9.0 8.7* 8.8*   GFR: Estimated Creatinine Clearance: 12.6 mL/min (A) (by C-G formula based on SCr of 3.38 mg/dL (H)).  Liver Function Tests: Recent Labs  Lab 12/15/23 1819 12/16/23 0532 12/17/23 0610 12/18/23 0442  AST 214* 184* 137* 128*  ALT 67* 61* 44 38  ALKPHOS 1,035* 974* 829* 775*  BILITOT 0.9 1.4* 1.3* 0.9  PROT 6.9 6.9 6.5 6.7  ALBUMIN 2.1* 2.1* 2.0* 2.0*   No results for input(s): "LIPASE", "AMYLASE" in the last 168 hours. Recent Labs  Lab 12/15/23 1819  AMMONIA <13    CBC: Recent Labs  Lab 12/15/23 1819 12/16/23 0532 12/17/23 0610 12/18/23 0442  WBC 18.2* 21.3* 21.0* 16.2*  NEUTROABS 15.8*  --   --  14.0*  HGB 7.7* 6.7* 7.9* 8.2*  HCT 24.8* 22.3* 24.8* 25.5*  MCV 92.5 93.7 89.9 88.9  PLT 414* 415* 438* 407*     HbA1C: Hgb A1c MFr Bld  Date/Time Value  Ref Range Status  05/09/2019 05:47 AM 5.4 4.8 - 5.6 % Final    Comment:    (NOTE) Pre diabetes:          5.7%-6.4% Diabetes:              >6.4% Glycemic control for   <7.0% adults with diabetes   09/13/2017 03:30 PM 5.3 4.8 - 5.6 % Final    Comment:             Prediabetes: 5.7 - 6.4          Diabetes: >6.4          Glycemic control for adults with diabetes: <7.0     Urinalysis: Recent Labs    12/15/23 1740  COLORURINE AMBER*  LABSPEC 1.017  PHURINE 7.0  GLUCOSEU NEGATIVE  HGBUR SMALL*  BILIRUBINUR NEGATIVE  KETONESUR NEGATIVE  PROTEINUR 100*  NITRITE NEGATIVE  LEUKOCYTESUR LARGE*      Imaging: DG C-Arm 1-60 Min-No Report Result Date: 12/17/2023 Fluoroscopy was utilized by the requesting physician.  No radiographic interpretation.     Medications:    cefTRIAXone  (ROCEPHIN )  IV 2 g (12/18/23 1610)   metronidazole  500 mg (12/18/23 1312)    aspirin   81 mg Per Tube Daily   Chlorhexidine  Gluconate  Cloth  6 each Topical Q0600   cloBAZam   5 mg Per Tube Q12H   diclofenac   100 mg Rectal Once   feeding supplement  237 mL Oral BID BM   heparin   5,000 Units Subcutaneous Q8H   lacosamide   200 mg Per Tube BID   levETIRAcetam   500 mg Per Tube BID   mupirocin  ointment  1 Application Topical BID   nicotine   21 mg Transdermal Daily   omeprazole   20 mg Oral Daily   sertraline   25 mg Oral Daily   sevelamer  carbonate  800 mg Per Tube TID WC   sodium hypochlorite  1 Application Irrigation Daily    Assessment/ Plan:      68 y.o.  female with past medical conditions including seizures, chronic biliary duct dilation, CHF, hypertension, stroke, diastolic heart failure, depression anxiety, bipolar, anemia, breast cancer status post right mastectomy, polypharmacy, end-stage renal disease on hemodialysis, who was admitted to [facility] on 12/15/2023 for  Elevated LFTs [R79.89] Liver lesion [K76.9] Dilation of biliary tract [K83.8] Sepsis (HCC) [A41.9] Urinary tract infection without  hematuria, site unspecified [N39.0] Altered mental status, unspecified altered mental status type [R41.82] Sepsis, due to unspecified organism, unspecified whether acute organ dysfunction present   #1: End-stage renal disease: Patient is tolerating dialysis treatment well.  Attempt fluid removal as tolerated.  #2: Secondary hyperparathyroidism: Will continue to monitor PTH, calcium  and phosphorus levels.  Continue sevelamer  as ordered.  #3: Anemia: Anemia of chronic kidney disease continue to follow anemia protocols.  #4: Sepsis: Patient has biliary sepsis with E. coli.  Continue metronidazole  and Rocephin .  #4: Hypertension: Blood pressure is stable.  Now on hydralazine  and carvedilol  presently on hold.  Labs and medications reviewed. Will continue to follow along with you.   LOS: 3 Worthy Heads, MD [facility] 6/7/20252:04 PM

## 2023-12-18 NOTE — Progress Notes (Signed)
 Hemodialysis Note:  Received patient in bed to unit. Alert and oriented. Informed consent singed and in chart.  Treatment initiated: 0830 Treatment completed: 1208  Access used: Right Subclavian catheter Access issues: None  Patient tolerated well. Transported back to room, alert without acute distress. Report given to patient's RN.  Total UF removed: 1 Liter Medications given: None  Post HD weight: 56 Kg  [name]

## 2023-12-19 DIAGNOSIS — R6520 Severe sepsis without septic shock: Secondary | ICD-10-CM | POA: Diagnosis not present

## 2023-12-19 DIAGNOSIS — A419 Sepsis, unspecified organism: Secondary | ICD-10-CM | POA: Diagnosis not present

## 2023-12-19 LAB — AEROBIC/ANAEROBIC CULTURE W GRAM STAIN (SURGICAL/DEEP WOUND): Gram Stain: NONE SEEN

## 2023-12-19 MED ORDER — METRONIDAZOLE 500 MG PO TABS
500.0000 mg | ORAL_TABLET | Freq: Two times a day (BID) | ORAL | Status: DC
  Administered 2023-12-19 – 2023-12-20 (×2): 500 mg
  Filled 2023-12-19 (×2): qty 1

## 2023-12-19 NOTE — Plan of Care (Signed)

## 2023-12-19 NOTE — TOC Initial Note (Signed)
 Transition of Care [facility]) - Initial/Assessment Note    Patient Details  Name: Sherry [name] MRN: [id number] Date of Birth: June 16, 1956  Transition of Care [facility]) CM/SW Contact:    Alexandra Ice, RN Phone Number: 12/19/2023, 3:37 PM  Clinical Narrative:                 Patient is from [facility]. FL2 is pending MD signature. TOC will continue to follow for additional discharge needs.        Patient Goals and CMS Choice            Expected Discharge Plan and Services                                              Prior Living Arrangements/Services                       Activities of Daily Living   ADL Screening (condition at time of admission) Independently performs ADLs?: No Is the patient deaf or have difficulty hearing?: No Does the patient have difficulty seeing, even when wearing glasses/contacts?: No Does the patient have difficulty concentrating, remembering, or making decisions?: Yes  Permission Sought/Granted                  Emotional Assessment              Admission diagnosis:  Elevated LFTs [R79.89] Liver lesion [K76.9] Dilation of biliary tract [K83.8] Sepsis (HCC) [A41.9] Urinary tract infection without hematuria, site unspecified [N39.0] Altered mental status, unspecified altered mental status type [R41.82] Sepsis, due to unspecified organism, unspecified whether acute organ dysfunction present [facility]) [A41.9] Patient Active Problem List   Diagnosis Date Noted   Calculus of bile duct without cholecystitis with obstruction 12/17/2023   E coli bacteremia 12/16/2023   Cholangitis 12/16/2023   Severe sepsis (HCC) 12/15/2023   UTI (urinary tract infection) 12/15/2023   Liver lesion 12/15/2023   Acute metabolic encephalopathy 12/15/2023   Sacral wound 12/15/2023   Abnormal liver function 12/15/2023   Seizure (HCC) 12/15/2023   Benzodiazepine dependence (HCC) 05/22/2023   Polypharmacy 05/22/2023    History of substance abuse (HCC) 05/22/2023   ESRD on dialysis (HCC) 01/29/2022   Dialysis AV fistula malfunction, initial encounter (HCC) 01/29/2022   Status post lumbar surgery 06/06/2021   Tardive dyskinesia 04/04/2021   Hyperkalemia 04/03/2021   Impaired functional mobility, balance, gait, and endurance 01/09/2021   Spondylolisthesis, lumbar region 10/21/2020   Anxiety    Bipolar and related disorder (HCC)    Blood transfusion without reported diagnosis    Breast cancer (HCC)    CHF (congestive heart failure) (HCC)    Chronic back pain    Chronic kidney disease    Depression    Dyspnea    Hepatitis C    MRSA infection    Opioid dependence (HCC)    Substance abuse (HCC)    Body mass index (BMI) 40.0-44.9, adult (HCC) 07/19/2020   Chest pain 05/31/2020   Near syncope 05/29/2020   Chest pain, rule out acute myocardial infarction 05/29/2020   Cervical spondylosis 05/24/2020   Clonus 05/24/2020   Neck pain 05/24/2020   Chronic bilateral low back pain with right-sided sciatica 05/24/2020   Arthropathy of lumbar facet joint 05/24/2020   CKD (chronic kidney disease) stage 4, GFR 15-29 ml/min (HCC) 04/18/2020  ARF (acute renal failure) (HCC) 04/09/2020   Nausea 04/09/2020   Chronic diastolic CHF (congestive heart failure) (HCC) 05/09/2019   GERD (gastroesophageal reflux disease) 05/09/2019   Alcohol  abuse 05/09/2019   Hypertension 05/08/2019   Bilateral low back pain with bilateral sciatica 01/05/2019   Stroke (HCC) 2020   Cyst of ovary    Pre-operative cardiovascular examination 06/13/2018   Dizziness 02/24/2018   Left-sided weakness 02/24/2018   Cigarette smoker 12/31/2017   DOE (dyspnea on exertion) 12/30/2017   Phlebitis after infusion 10/28/2017   Hypertensive emergency 10/21/2017   Microcytic anemia 10/21/2017   Medication intolerance 09/06/2017   Neuroleptic-induced tardive dyskinesia 09/06/2017   Cancer (HCC) 09/06/2017   Chronic low back pain 09/06/2017    Grief reaction with prolonged bereavement 09/06/2017   Opioid use disorder, severe, dependence (HCC) 08/27/2017   Alcohol  use disorder, severe, dependence (HCC) 08/27/2017   Substance induced mood disorder (HCC) 08/13/2017   MDD (major depressive disorder), recurrent severe, without psychosis (HCC)    Alcohol  withdrawal (HCC) 08/09/2017   Essential hypertension 07/06/2017   Chronic kidney disease, stage III (moderate) (HCC) 11/17/2016   Depression with anxiety 08/21/2016   Hx of bipolar disorder 01/31/2016   Oral aphthous ulcer 06/25/2015   History of breast cancer 02/13/2013   Morbid obesity due to excess calories (HCC) 02/13/2013   Restless legs syndrome 02/13/2013   Subacute dyskinesia due to drug 02/13/2013   Dyspnea and respiratory abnormality 02/13/2013   Hepatitis C virus infection cured after antiviral drug therapy 12/17/2012   PCP:  Tita Form, MD Pharmacy:   [id number] - [place], [place] - [address] [place] [address] Phone: [phone] Fax: [phone]  [id number] - [place], [place] - [address] AT [facility] [address] [place] [address] Phone: [phone] Fax: [phone]  [facility] [place] - [facility] [address], [address] [place] [address] Phone: [phone] Fax: [phone]     Social Drivers of Health (SDOH) Social History: SDOH Screenings   Food Insecurity: No Food Insecurity (12/16/2023)  Housing: Low Risk  (12/16/2023)  Transportation Needs: No Transportation Needs (12/16/2023)  Utilities: Not At Risk (12/16/2023)  Alcohol  Screen: Medium Risk (08/27/2017)  Depression (PHQ2-9): Medium Risk (12/20/2020)  Social Connections: Patient Unable To Answer (12/16/2023)  Tobacco Use: High Risk (12/17/2023)   SDOH Interventions:     Readmission Risk Interventions     No data to display

## 2023-12-19 NOTE — NC FL2 (Signed)
 [place]  MEDICAID FL2 LEVEL OF CARE FORM     IDENTIFICATION  Patient Name: Sherry [name] Birthdate: [birth year] Sex: female Admission Date (Current Location): 12/15/2023  [facility] and [place] Number:  Chiropodist and Address:  [facility], [address] [place], [place] [address]      Provider Number: [id number]  Attending Physician Name and Address:  Sheril Dines, MD  Relative Name and Phone Number:  Sister: Rhae Cease [phone]    Current Level of Care: Hospital Recommended Level of Care: Nursing Facility Prior Approval Number:    Date Approved/Denied:   PASRR Number: [id number]  Discharge Plan: SNF    Current Diagnoses: Patient Active Problem List   Diagnosis Date Noted   Calculus of bile duct without cholecystitis with obstruction 12/17/2023   E coli bacteremia 12/16/2023   Cholangitis 12/16/2023   Severe sepsis (HCC) 12/15/2023   UTI (urinary tract infection) 12/15/2023   Liver lesion 12/15/2023   Acute metabolic encephalopathy 12/15/2023   Sacral wound 12/15/2023   Abnormal liver function 12/15/2023   Seizure (HCC) 12/15/2023   Benzodiazepine dependence (HCC) 05/22/2023   Polypharmacy 05/22/2023   History of substance abuse (HCC) 05/22/2023   ESRD on dialysis (HCC) 01/29/2022   Dialysis AV fistula malfunction, initial encounter (HCC) 01/29/2022   Status post lumbar surgery 06/06/2021   Tardive dyskinesia 04/04/2021   Hyperkalemia 04/03/2021   Impaired functional mobility, balance, gait, and endurance 01/09/2021   Spondylolisthesis, lumbar region 10/21/2020   Anxiety    Bipolar and related disorder (HCC)    Blood transfusion without reported diagnosis    Breast cancer (HCC)    CHF (congestive heart failure) (HCC)    Chronic back pain    Chronic kidney disease    Depression    Dyspnea    Hepatitis C    MRSA infection    Opioid dependence (HCC)    Substance abuse (HCC)    Body mass index (BMI) 40.0-44.9,  adult (HCC) 07/19/2020   Chest pain 05/31/2020   Near syncope 05/29/2020   Chest pain, rule out acute myocardial infarction 05/29/2020   Cervical spondylosis 05/24/2020   Clonus 05/24/2020   Neck pain 05/24/2020   Chronic bilateral low back pain with right-sided sciatica 05/24/2020   Arthropathy of lumbar facet joint 05/24/2020   CKD (chronic kidney disease) stage 4, GFR 15-29 ml/min (HCC) 04/18/2020   ARF (acute renal failure) (HCC) 04/09/2020   Nausea 04/09/2020   Chronic diastolic CHF (congestive heart failure) (HCC) 05/09/2019   GERD (gastroesophageal reflux disease) 05/09/2019   Alcohol  abuse 05/09/2019   Hypertension 05/08/2019   Bilateral low back pain with bilateral sciatica 01/05/2019   Stroke (HCC) 2020   Cyst of ovary    Pre-operative cardiovascular examination 06/13/2018   Dizziness 02/24/2018   Left-sided weakness 02/24/2018   Cigarette smoker 12/31/2017   DOE (dyspnea on exertion) 12/30/2017   Phlebitis after infusion 10/28/2017   Hypertensive emergency 10/21/2017   Microcytic anemia 10/21/2017   Medication intolerance 09/06/2017   Neuroleptic-induced tardive dyskinesia 09/06/2017   Cancer (HCC) 09/06/2017   Chronic low back pain 09/06/2017   Grief reaction with prolonged bereavement 09/06/2017   Opioid use disorder, severe, dependence (HCC) 08/27/2017   Alcohol  use disorder, severe, dependence (HCC) 08/27/2017   Substance induced mood disorder (HCC) 08/13/2017   MDD (major depressive disorder), recurrent severe, without psychosis (HCC)    Alcohol  withdrawal (HCC) 08/09/2017   Essential hypertension 07/06/2017   Chronic kidney disease, stage III (moderate) (HCC) 11/17/2016  Depression with anxiety 08/21/2016   Hx of bipolar disorder 01/31/2016   Oral aphthous ulcer 06/25/2015   History of breast cancer 02/13/2013   Morbid obesity due to excess calories (HCC) 02/13/2013   Restless legs syndrome 02/13/2013   Subacute dyskinesia due to drug 02/13/2013    Dyspnea and respiratory abnormality 02/13/2013   Hepatitis C virus infection cured after antiviral drug therapy 12/17/2012    Orientation RESPIRATION BLADDER Height & Weight     Self, Time, Situation  Normal Incontinent Weight: 57 kg Height:  5\' 2"  (157.5 cm)  BEHAVIORAL SYMPTOMS/MOOD NEUROLOGICAL BOWEL NUTRITION STATUS      Continent Diet (Dysphagia I, thin liquid)  AMBULATORY STATUS COMMUNICATION OF NEEDS Skin   Extensive Assist Verbally Normal                       Personal Care Assistance Level of Assistance  Bathing, Feeding, Dressing Bathing Assistance: Maximum assistance Feeding assistance: Limited assistance Dressing Assistance: Limited assistance     Functional Limitations Info             SPECIAL CARE FACTORS FREQUENCY  PT (By licensed PT), OT (By licensed OT)     PT Frequency: 2 times per week OT Frequency: 2 times per week            Contractures Contractures Info: Not present    Additional Factors Info  Code Status, Allergies Code Status Info: Full Code Allergies Info: Abilify, Remeron  (mirtazapine ), Trazodone  And Nefazodone, Augmentin (amoxicillin -pot Clavulanate), Flexeril (cyclobenzaprine),   Amoxicillin            Current Medications (12/19/2023):  This is the current hospital active medication list Current Facility-Administered Medications  Medication Dose Route Frequency Provider Last Rate Last Admin   aspirin  chewable tablet 81 mg  81 mg Per Tube Daily Niu, Xilin, MD   81 mg at 12/19/23 0910   cefTRIAXone  (ROCEPHIN ) 2 g in sodium chloride  0.9 % 100 mL IVPB  2 g Intravenous Q24H Niu, Xilin, MD 200 mL/hr at 12/19/23 0614 2 g at 12/19/23 7829   Chlorhexidine  Gluconate Cloth 2 % PADS 6 each  6 each Topical Q0600 Anola King, NP   6 each at 12/19/23 0600   cloBAZam  (ONFI ) 2.5 MG/ML oral suspension 5 mg  5 mg Per Tube Q12H Niu, Xilin, MD   5 mg at 12/19/23 5621   diclofenac  suppository 100 mg  100 mg Rectal Once Wohl, Darren, MD        feeding supplement (ENSURE PLUS HIGH PROTEIN) liquid 237 mL  237 mL Oral BID BM Sheril Dines, MD   237 mL at 12/17/23 1511   heparin  injection 5,000 Units  5,000 Units Subcutaneous Q8H Niu, Xilin, MD   5,000 Units at 12/19/23 1445   hydrALAZINE  (APRESOLINE ) injection 5 mg  5 mg Intravenous Q2H PRN Niu, Xilin, MD       hydrOXYzine  (ATARAX ) tablet 25 mg  25 mg Oral TID PRN Niu, Xilin, MD   25 mg at 12/16/23 1608   ibuprofen  (ADVIL ) tablet 400 mg  400 mg Oral Q6H PRN Niu, Xilin, MD   400 mg at 12/18/23 1732   lacosamide  (VIMPAT ) tablet 200 mg  200 mg Per Tube BID Niu, Xilin, MD   200 mg at 12/19/23 0944   levETIRAcetam  (KEPPRA ) 100 MG/ML solution 500 mg  500 mg Per Tube BID Niu, Xilin, MD   500 mg at 12/19/23 0911   LORazepam  (ATIVAN ) injection 2 mg  2 mg Intravenous Q2H  PRN Niu, Xilin, MD       metroNIDAZOLE  (FLAGYL ) IVPB 500 mg  500 mg Intravenous Q12H Page Boast, RPH 100 mL/hr at 12/19/23 0913 500 mg at 12/19/23 0913   metroNIDAZOLE  (FLAGYL ) tablet 500 mg  500 mg Per Tube Q12H Page Boast, [facility]       mupirocin  ointment (BACTROBAN ) 2 % 1 Application  1 Application Topical BID Niu, Xilin, MD   1 Application at 12/19/23 6578   nicotine  (NICODERM CQ  - dosed in mg/24 hours) patch 21 mg  21 mg Transdermal Daily Niu, Xilin, MD   21 mg at 12/19/23 0001   omeprazole  (KONVOMEP ) 2 mg/mL oral suspension 20 mg  20 mg Oral Daily Niu, Xilin, MD   20 mg at 12/19/23 0944   ondansetron  (ZOFRAN ) injection 4 mg  4 mg Intravenous Q8H PRN Niu, Xilin, MD       oxyCODONE  (Oxy IR/ROXICODONE ) immediate release tablet 5 mg  5 mg Per Tube Q4H PRN Niu, Xilin, MD   5 mg at 12/19/23 1328   senna-docusate (Senokot-S) tablet 1 tablet  1 tablet Oral QHS PRN Niu, Xilin, MD       sertraline  (ZOLOFT ) tablet 25 mg  25 mg Oral Daily Niu, Xilin, MD   25 mg at 12/19/23 4696   sevelamer  carbonate (RENVELA ) tablet 800 mg  800 mg Per Tube TID WC Niu, Xilin, MD   800 mg at 12/19/23 1328     Discharge Medications: Please see  discharge summary for a list of discharge medications.  Relevant Imaging Results:  Relevant Lab Results:   Additional Information SSN [id number]; Covid Vaccinated x2  Carlotta Chew, Stephens Eis, RN

## 2023-12-19 NOTE — Progress Notes (Signed)
 Progress Note    Sherry [name]  ZOX:[id number] DOB: 06-28-1956  DOA: 12/15/2023 PCP: Tita Form, MD      Brief Narrative:    Medical records reviewed and are as summarized below:  Sherry [name] is a 68 y.o. female with medical history significant for ESRD on HD (on MWF schedule), seizure, chronic biliary duct dilation in the setting of a cholecystectomy which is felt to be physiological and normal per GI consult (this is documented by Dr. Gordon Latus on 02/24/22), chronic hepatitis C, opioid use disorder, stroke, s/p of gastrostomy, hypertension, diastolic CHF, stroke, depression with anxiety, bipolar, anemia,HCV, breast cancer (s/p right mastectomy), polypharmacy.  She was previously hospitalized at [facility] in February 2025 for E. coli pneumonia and bacteremia, cerebellar stroke and tonic-clonic seizures in the setting of severe PRES that required intubation and subsequent tracheostomy and PEG tube placement.  She presented to the hospital because of fever and altered mental status.  Reportedly, she was noted to be confused and she spiked a temperature with fever of 102.4 F on the day of admission.   ED Course: pt was found to have WBC 18.2, lactic acid of 1.4 --> 1.1, potassium 3.6, positive UA (turbid appearance, large amount of leukocyte, many bacteria, WBC > 50, squamous cell 11-20), negative PCR for COVID, flu and RSV, ammonia level<13, troponin 25 --> 22, abnormal liver function (ALP 1035, AST 214, ALT 67, total bilirubin 0.9). Blood pressure 150/72 --> 119/58, heart rate 95, RR 21, oxygen saturation 94% on room air.  Chest x-ray negative.  CT of head negative.  Patient is admitted to PCU as inpatient.  Dr. Zelda Hickman of renal is consulted.   CT of abdomen/pelvis/chest: 1. Diffuse biliary duct dilatation with several small hypodense nodular areas in the liver concerning for intrahepatic abscesses versus less likely metastatic disease. Further evaluation with MRI/MRCP is recommended. 2.  Bibasilar subpleural atelectasis. Pneumonia is less likely but not excluded. 3. 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 4. Constipation with possible fecal impaction in the rectal vault. No bowel obstruction. Normal appendix. 5.  Aortic Atherosclerosis (ICD10-I70.0).   MRCP:    IMPRESSION: 1. Exam detail is diminished due to motion artifact. 2. Within the right lobe of liver there are multifocal areas of mural enhancing T2 hyperintense foci which are favored to represent dilated biliary radicles. The less dilated more central bile ducts within the right lobe have irregular appearance with multiple focal areas of narrowing giving it a string of pearls like appearance. In the acute setting imaging findings are concerning for cholangitis. Differential considerations include multiple peripheral liver abscesses. Underlying malignancy is considered less favored. Follow-up imaging advised to ensure resolution following appropriate therapy. 3. Status post cholecystectomy. Common bile duct dilatation measures up to 2.1 cm. Abrupt focal narrowing of the distal 7 mm of the CBD is favored to represent underlying stricture possibly postinflammatory or secondary to previously passed stone. Debris is noted within the CBD with a few small foci of signal void artifact measuring approximately 3 mm which may reflect small stones versus signal void artifact. 4. Trace bilateral pleural effusions with overlying subpleural consolidation noted in both lung bases. 5. Bilateral renal cortical atrophy. 6. Small subcutaneous nodule within the right paramidline ventral abdominal wall is favored to represent a small hematoma. Possibly due to injection site.    She was admitted to the hospital for severe sepsis from E. coli bacteremia, probable acute UTI and suspected acute cholangitis.  Assessment/Plan:   Principal Problem:   Severe sepsis (HCC) Active Problems:    Liver lesion   Abnormal liver function   UTI (urinary tract infection)   Acute metabolic encephalopathy   ESRD on dialysis [facility])   Essential hypertension   Chronic diastolic CHF (congestive heart failure) (HCC)   Stroke (HCC)   Seizure (HCC)   Cigarette smoker   Hx of bipolar disorder   Sacral wound   E coli bacteremia   Cholangitis   Calculus of bile duct without cholecystitis with obstruction    Body mass index is 22.98 kg/m.   Severe sepsis, E. coli bacteremia, probable acute UTI, probable acute cholangitis, leukocytosis:  4 out of 4 blood cultures positive for E. coli.   No growth on urine culture Sacral wound culture showed multiple bacteria (Klebsiella pneumoniae, E. coli, Bacteroides fragilis, Enterococcus faecalis and Corynebacterium striatum). Continue IV ceftriaxone . Follow-up with ID specialist for further recommendations   Chronic biliary dilatation from previous cholecystectomy, suspected acute cholangitis, suspected multiple peripheral liver abscesses on MRCP, chronically elevated liver enzymes, chronic hepatitis C:  S/p ERCP on 12/2023.  "A filling defect was seen on the cholangiogram.  Multiple biliary strictures were found in the entire biliary tree.  Strictures were inflammatory.  Choledocholithiasis was found.  Complete removal was accomplished by biliary sphincterotomy and balloon extraction.  A biliary sphincterectomy was performed.  The biliary tree was swept and nothing was found.  1 plastic stent was placed into the common bile duct".   Acute metabolic encephalopathy: Improved.  She is back to baseline.  Likely due to infectious process.  CT head did not show any acute stroke.   Acute on chronic anemia: Hemoglobin up from 6.7-7.9-8.2. S/p transfusion of 1 unit of PRBCs on 12/16/2023.   Monitor H&H and transfuse as needed.   ESRD on TTS schedule: Follow-up with nephrologist.  Chronic diastolic CHF: Compensated   Seizure disorder: Continue clobazam ,  Vimpat  and Keppra .   Sacral decubitus ulcer (stage III versus stage IV): Present on admission.  According to [facility], patient's niece, wound was contaminated with fecal material about a week prior to admission. Consulted Dr. Ofilia Benton, general surgeon, to see if patient needs wound VAC.   History of stroke, dysphagia with gastrostomy tube in place Patient is on dysphagia 1 diet (confirmed by [place], niece).     Diet Order             DIET - DYS 1 Room service appropriate? Yes; Fluid consistency: Thin  Diet effective now                            Consultants: ID specialist Nephrologist Gastroenterologist  Procedures: ERCP 12/17/2023    Medications:    aspirin   81 mg Per Tube Daily   Chlorhexidine  Gluconate Cloth  6 each Topical Q0600   cloBAZam   5 mg Per Tube Q12H   diclofenac   100 mg Rectal Once   feeding supplement  237 mL Oral BID BM   heparin   5,000 Units Subcutaneous Q8H   lacosamide   200 mg Per Tube BID   levETIRAcetam   500 mg Per Tube BID   metroNIDAZOLE   500 mg Per Tube Q12H   mupirocin  ointment  1 Application Topical BID   nicotine   21 mg Transdermal Daily   omeprazole   20 mg Oral Daily   sertraline   25 mg Oral Daily   sevelamer  carbonate  800 mg Per Tube TID WC   Continuous  Infusions:  cefTRIAXone  (ROCEPHIN )  IV 2 g (12/19/23 4132)   metronidazole  500 mg (12/19/23 0913)     Anti-infectives (From admission, onward)    Start     Dose/Rate Route Frequency Ordered Stop   12/19/23 2200  metroNIDAZOLE  (FLAGYL ) tablet 500 mg        500 mg Per Tube Every 12 hours 12/19/23 0815     12/16/23 2200  ceFEPIme  (MAXIPIME ) 1 g in sodium chloride  0.9 % 100 mL IVPB  Status:  Discontinued        1 g 200 mL/hr over 30 Minutes Intravenous Every 24 hours 12/15/23 2037 12/16/23 0549   12/16/23 1800  metroNIDAZOLE  (FLAGYL ) IVPB 500 mg        500 mg 100 mL/hr over 60 Minutes Intravenous Every 12 hours 12/16/23 1511 12/19/23 2159   12/16/23 1200  vancomycin   (VANCOREADY) IVPB 750 mg/150 mL  Status:  Discontinued        750 mg 150 mL/hr over 60 Minutes Intravenous Every T-Th-Sa (Hemodialysis) 12/15/23 2312 12/16/23 0549   12/16/23 0600  metroNIDAZOLE  (FLAGYL ) IVPB 500 mg  Status:  Discontinued        500 mg 100 mL/hr over 60 Minutes Intravenous Every 12 hours 12/15/23 2035 12/16/23 0549   12/16/23 0600  cefTRIAXone  (ROCEPHIN ) 2 g in sodium chloride  0.9 % 100 mL IVPB        2 g 200 mL/hr over 30 Minutes Intravenous Every 24 hours 12/16/23 0553     12/15/23 2245  vancomycin  (VANCOREADY) IVPB 500 mg/100 mL        500 mg 100 mL/hr over 60 Minutes Intravenous  Once 12/15/23 2236 12/16/23 0532   12/15/23 1600  ceFEPIme  (MAXIPIME ) 2 g in sodium chloride  0.9 % 100 mL IVPB        2 g 200 mL/hr over 30 Minutes Intravenous  Once 12/15/23 1548 12/15/23 1819   12/15/23 1600  metroNIDAZOLE  (FLAGYL ) IVPB 500 mg        500 mg 100 mL/hr over 60 Minutes Intravenous  Once 12/15/23 1548 12/15/23 1955   12/15/23 1600  vancomycin  (VANCOCIN ) IVPB 1000 mg/200 mL premix        1,000 mg 200 mL/hr over 60 Minutes Intravenous  Once 12/15/23 1548 12/15/23 2128              Family Communication/Anticipated D/C date and plan/Code Status   DVT prophylaxis: heparin  injection 5,000 Units Start: 12/15/23 2200     Code Status: Full Code  Family Communication: Plan with Avis Lemming, niece, over the phone  Disposition Plan: Plan to discharge to SNF   Status is: Inpatient Remains inpatient appropriate because: Sepsis, E. coli bacteremia       Subjective:   Interval events noted.  She complains of pain from sacral wound.  Objective:    Vitals:   12/19/23 0500 12/19/23 0600 12/19/23 0619 12/19/23 0700  BP:   (!) 119/59   Pulse:   68   Resp: 16 19 20 11   Temp:   98.6 F (37 C)   TempSrc:      SpO2:   100%   Weight: 57 kg     Height:       No data found.   Intake/Output Summary (Last 24 hours) at 12/19/2023 1359 Last data filed at 12/19/2023  1033 Gross per 24 hour  Intake 598.1 ml  Output --  Net 598.1 ml    Filed Weights   12/18/23 0811 12/18/23 1208 12/19/23 0500  Weight: 57  kg 56 kg 57 kg    Exam:  GEN: NAD SKIN: Warm and dry EYES: No pallor or icterus ENT: MMM CV: RRR PULM: CTA B ABD: soft, ND, NT, +BS, gastrostomy tube in place CNS: AAO x 3, non focal, abnormal involuntary movements of the mouth likely from tardive dyskinesia EXT: No edema or tenderness       Data Reviewed:   I have personally reviewed following labs and imaging studies:  Labs: Labs show the following:   Basic Metabolic Panel: Recent Labs  Lab 12/15/23 1819 12/16/23 0532 12/17/23 0610 12/18/23 0442  NA 142 139 138 139  K 3.6 3.6 3.5 3.7  CL 103 102 100 99  CO2 26 27 27 27   GLUCOSE 139* 110* 83 92  BUN 45* 53* 31* 51*  CREATININE 3.61* 4.13* 2.53* 3.38*  CALCIUM  8.8* 9.0 8.7* 8.8*   GFR Estimated Creatinine Clearance: 12.6 mL/min (A) (by C-G formula based on SCr of 3.38 mg/dL (H)). Liver Function Tests: Recent Labs  Lab 12/15/23 1819 12/16/23 0532 12/17/23 0610 12/18/23 0442  AST 214* 184* 137* 128*  ALT 67* 61* 44 38  ALKPHOS 1,035* 974* 829* 775*  BILITOT 0.9 1.4* 1.3* 0.9  PROT 6.9 6.9 6.5 6.7  ALBUMIN 2.1* 2.1* 2.0* 2.0*   No results for input(s): "LIPASE", "AMYLASE" in the last 168 hours. Recent Labs  Lab 12/15/23 1819  AMMONIA <13   Coagulation profile Recent Labs  Lab 12/15/23 1820  INR 1.3*    CBC: Recent Labs  Lab 12/15/23 1819 12/16/23 0532 12/17/23 0610 12/18/23 0442  WBC 18.2* 21.3* 21.0* 16.2*  NEUTROABS 15.8*  --   --  14.0*  HGB 7.7* 6.7* 7.9* 8.2*  HCT 24.8* 22.3* 24.8* 25.5*  MCV 92.5 93.7 89.9 88.9  PLT 414* 415* 438* 407*   Cardiac Enzymes: Recent Labs  Lab 12/15/23 1819  CKTOTAL 11*   BNP (last 3 results) No results for input(s): "PROBNP" in the last 8760 hours. CBG: Recent Labs  Lab 12/16/23 0917  GLUCAP 111*   D-Dimer: No results for input(s): "DDIMER"  in the last 72 hours. Hgb A1c: No results for input(s): "HGBA1C" in the last 72 hours. Lipid Profile: No results for input(s): "CHOL", "HDL", "LDLCALC", "TRIG", "CHOLHDL", "LDLDIRECT" in the last 72 hours. Thyroid function studies: No results for input(s): "TSH", "T4TOTAL", "T3FREE", "THYROIDAB" in the last 72 hours.  Invalid input(s): "FREET3" Anemia work up: No results for input(s): "VITAMINB12", "FOLATE", "FERRITIN", "TIBC", "IRON", "RETICCTPCT" in the last 72 hours.  Sepsis Labs: Recent Labs  Lab 12/15/23 1630 12/15/23 1819 12/16/23 0532 12/17/23 0610 12/18/23 0442  WBC  --  18.2* 21.3* 21.0* 16.2*  LATICACIDVEN 1.4 1.1  --   --   --     Microbiology Recent Results (from the past 240 hours)  Resp panel by RT-PCR (RSV, Flu A&B, Covid) Anterior Nasal Swab     Status: None   Collection Time: 12/15/23  4:11 PM   Specimen: Anterior Nasal Swab  Result Value Ref Range Status   SARS Coronavirus 2 by RT PCR NEGATIVE NEGATIVE Final    Comment: (NOTE) SARS-CoV-2 target nucleic acids are NOT DETECTED.  The SARS-CoV-2 RNA is generally detectable in upper respiratory specimens during the acute phase of infection. The lowest concentration of SARS-CoV-2 viral copies this assay can detect is 138 copies/mL. A negative result does not preclude SARS-Cov-2 infection and should not be used as the sole basis for treatment or other patient management decisions. A negative result may occur  with  improper specimen collection/handling, submission of specimen other than nasopharyngeal swab, presence of viral mutation(s) within the areas targeted by this assay, and inadequate number of viral copies(<138 copies/mL). A negative result must be combined with clinical observations, patient history, and epidemiological information. The expected result is Negative.  Fact Sheet for Patients:  BloggerCourse.com  Fact Sheet for Healthcare Providers:   SeriousBroker.it  This test is no t yet approved or cleared by the [place]  FDA and  has been authorized for detection and/or diagnosis of SARS-CoV-2 by FDA under an Emergency Use Authorization (EUA). This EUA will remain  in effect (meaning this test can be used) for the duration of the COVID-19 declaration under Section 564(b)(1) of the Act, 21 U.S.C.section 360bbb-3(b)(1), unless the authorization is terminated  or revoked sooner.       Influenza A by PCR NEGATIVE NEGATIVE Final   Influenza B by PCR NEGATIVE NEGATIVE Final    Comment: (NOTE) The Xpert Xpress SARS-CoV-2/FLU/RSV plus assay is intended as an aid in the diagnosis of influenza from Nasopharyngeal swab specimens and should not be used as a sole basis for treatment. Nasal washings and aspirates are unacceptable for Xpert Xpress SARS-CoV-2/FLU/RSV testing.  Fact Sheet for Patients: BloggerCourse.com  Fact Sheet for Healthcare Providers: SeriousBroker.it  This test is not yet approved or cleared by the [place]  FDA and has been authorized for detection and/or diagnosis of SARS-CoV-2 by FDA under an Emergency Use Authorization (EUA). This EUA will remain in effect (meaning this test can be used) for the duration of the COVID-19 declaration under Section 564(b)(1) of the Act, 21 U.S.C. section 360bbb-3(b)(1), unless the authorization is terminated or revoked.     Resp Syncytial Virus by PCR NEGATIVE NEGATIVE Final    Comment: (NOTE) Fact Sheet for Patients: BloggerCourse.com  Fact Sheet for Healthcare Providers: SeriousBroker.it  This test is not yet approved or cleared by the [place]  FDA and has been authorized for detection and/or diagnosis of SARS-CoV-2 by FDA under an Emergency Use Authorization (EUA). This EUA will remain in effect (meaning this test can be used) for  the duration of the COVID-19 declaration under Section 564(b)(1) of the Act, 21 U.S.C. section 360bbb-3(b)(1), unless the authorization is terminated or revoked.  Performed at [facility], [address] [place], [place] [address]   Blood Culture (routine x 2)     Status: Abnormal   Collection Time: 12/15/23  4:12 PM   Specimen: BLOOD  Result Value Ref Range Status   Specimen Description   Final    BLOOD RIGHT ANTECUBITAL Performed at [facility], [address] [place], [place] [address]    Special Requests   Final    BOTTLES DRAWN AEROBIC AND ANAEROBIC Blood Culture results may not be optimal due to an inadequate volume of blood received in culture bottles Performed at [facility], [address] [place], [place] [address]    Culture  Setup Time   Final    GRAM NEGATIVE RODS IN BOTH AEROBIC AND ANAEROBIC BOTTLES Organism ID to follow CRITICAL RESULT CALLED TO, READ BACK BY AND VERIFIED WITHLatina Pol AT 3295 12/16/23 JG Performed at [facility], [address] [place], [place] [address]    Culture ESCHERICHIA COLI (A)  Final   Report Status 12/18/2023 FINAL  Final   Organism ID, Bacteria ESCHERICHIA COLI  Final   Organism ID, Bacteria ESCHERICHIA COLI  Final      Susceptibility   Escherichia coli -  KIRBY BAUER*    CEFAZOLIN  RESISTANT Resistant    Escherichia coli - MIC*    AMPICILLIN >=32 RESISTANT Resistant     CEFEPIME  <=0.12 SENSITIVE Sensitive     CEFTAZIDIME <=1 SENSITIVE Sensitive     CEFTRIAXONE  <=0.25 SENSITIVE Sensitive     CIPROFLOXACIN >=4 RESISTANT Resistant     GENTAMICIN <=1 SENSITIVE Sensitive     IMIPENEM <=0.25 SENSITIVE Sensitive     TRIMETH /SULFA  <=20 SENSITIVE Sensitive     AMPICILLIN/SULBACTAM >=32 RESISTANT Resistant     PIP/TAZO <=4 SENSITIVE Sensitive ug/mL    * ESCHERICHIA COLI    ESCHERICHIA COLI  Aerobic/Anaerobic Culture w Gram Stain (surgical/deep wound)     Status: None   Collection  Time: 12/15/23  4:12 PM   Specimen: Wound  Result Value Ref Range Status   Specimen Description   Final    WOUND Performed at [facility], [address] [place], [place] [address]    Special Requests   Final    SACRAL Performed at [facility], [address] [place], [place] [address]    Gram Stain NO WBC SEEN RARE GRAM NEGATIVE RODS   Final   Culture   Final    ABUNDANT KLEBSIELLA PNEUMONIAE FEW ESCHERICHIA COLI FEW BACTEROIDES FRAGILIS BETA LACTAMASE POSITIVE FEW ENTEROCOCCUS FAECALIS FEW CORYNEBACTERIUM STRIATUM Standardized susceptibility testing for this organism is not available. Performed at [facility], [address] [place], [place] [address]    Report Status 12/19/2023 FINAL  Final   Organism ID, Bacteria KLEBSIELLA PNEUMONIAE  Final   Organism ID, Bacteria ESCHERICHIA COLI  Final   Organism ID, Bacteria ENTEROCOCCUS FAECALIS  Final      Susceptibility   Escherichia coli - MIC*    AMPICILLIN >=32 RESISTANT Resistant     CEFEPIME  <=0.12 SENSITIVE Sensitive     CEFTAZIDIME <=1 SENSITIVE Sensitive     CEFTRIAXONE  <=0.25 SENSITIVE Sensitive     CIPROFLOXACIN >=4 RESISTANT Resistant     GENTAMICIN <=1 SENSITIVE Sensitive     IMIPENEM <=0.25 SENSITIVE Sensitive     TRIMETH /SULFA  <=20 SENSITIVE Sensitive     AMPICILLIN/SULBACTAM >=32 RESISTANT Resistant     PIP/TAZO <=4 SENSITIVE Sensitive ug/mL    * FEW ESCHERICHIA COLI   Enterococcus faecalis - MIC*    AMPICILLIN <=2 SENSITIVE Sensitive     VANCOMYCIN  >=32 RESISTANT Resistant     GENTAMICIN SYNERGY SENSITIVE Sensitive     * FEW ENTEROCOCCUS FAECALIS   Klebsiella pneumoniae - MIC*    AMPICILLIN RESISTANT Resistant     CEFEPIME  <=0.12 SENSITIVE Sensitive     CEFTAZIDIME <=1 SENSITIVE Sensitive     CEFTRIAXONE  <=0.25 SENSITIVE Sensitive     CIPROFLOXACIN <=0.25 SENSITIVE Sensitive     GENTAMICIN <=1 SENSITIVE Sensitive     IMIPENEM <=0.25 SENSITIVE Sensitive     TRIMETH /SULFA   <=20 SENSITIVE Sensitive     AMPICILLIN/SULBACTAM <=2 SENSITIVE Sensitive     PIP/TAZO <=4 SENSITIVE Sensitive ug/mL    * ABUNDANT KLEBSIELLA PNEUMONIAE  Blood Culture ID Panel (Reflexed)     Status: Abnormal   Collection Time: 12/15/23  4:12 PM  Result Value Ref Range Status   Enterococcus faecalis NOT DETECTED NOT DETECTED Final   Enterococcus Faecium NOT DETECTED NOT DETECTED Final   Listeria monocytogenes NOT DETECTED NOT DETECTED Final   Staphylococcus species NOT DETECTED NOT DETECTED Final   Staphylococcus aureus (BCID) NOT DETECTED NOT DETECTED Final   Staphylococcus epidermidis NOT DETECTED NOT DETECTED Final   Staphylococcus lugdunensis  NOT DETECTED NOT DETECTED Final   Streptococcus species NOT DETECTED NOT DETECTED Final   Streptococcus agalactiae NOT DETECTED NOT DETECTED Final   Streptococcus pneumoniae NOT DETECTED NOT DETECTED Final   Streptococcus pyogenes NOT DETECTED NOT DETECTED Final   A.calcoaceticus-baumannii NOT DETECTED NOT DETECTED Final   Bacteroides fragilis NOT DETECTED NOT DETECTED Final   Enterobacterales DETECTED (A) NOT DETECTED Final    Comment: Enterobacterales represent a large order of gram negative bacteria, not a single organism. CRITICAL RESULT CALLED TO, READ BACK BY AND VERIFIED WITH:  JASON ROBINS AT 1610 12/16/23 JG    Enterobacter cloacae complex NOT DETECTED NOT DETECTED Final   Escherichia coli DETECTED (A) NOT DETECTED Final    Comment: CRITICAL RESULT CALLED TO, READ BACK BY AND VERIFIED WITH:  JASON ROBINS AT 0529 12/16/23 JG    Klebsiella aerogenes NOT DETECTED NOT DETECTED Final   Klebsiella oxytoca NOT DETECTED NOT DETECTED Final   Klebsiella pneumoniae NOT DETECTED NOT DETECTED Final   Proteus species NOT DETECTED NOT DETECTED Final   Salmonella species NOT DETECTED NOT DETECTED Final   Serratia marcescens NOT DETECTED NOT DETECTED Final   Haemophilus influenzae NOT DETECTED NOT DETECTED Final   Neisseria meningitidis NOT DETECTED  NOT DETECTED Final   Pseudomonas aeruginosa NOT DETECTED NOT DETECTED Final   Stenotrophomonas maltophilia NOT DETECTED NOT DETECTED Final   Candida albicans NOT DETECTED NOT DETECTED Final   Candida auris NOT DETECTED NOT DETECTED Final   Candida glabrata NOT DETECTED NOT DETECTED Final   Candida krusei NOT DETECTED NOT DETECTED Final   Candida parapsilosis NOT DETECTED NOT DETECTED Final   Candida tropicalis NOT DETECTED NOT DETECTED Final   Cryptococcus neoformans/gattii NOT DETECTED NOT DETECTED Final   CTX-M ESBL NOT DETECTED NOT DETECTED Final   Carbapenem resistance IMP NOT DETECTED NOT DETECTED Final   Carbapenem resistance KPC NOT DETECTED NOT DETECTED Final   Carbapenem resistance NDM NOT DETECTED NOT DETECTED Final   Carbapenem resist OXA 48 LIKE NOT DETECTED NOT DETECTED Final   Carbapenem resistance VIM NOT DETECTED NOT DETECTED Final    Comment: Performed at [facility], [address] [place], [place] [address]  Blood Culture (routine x 2)     Status: Abnormal   Collection Time: 12/15/23  4:30 PM   Specimen: BLOOD  Result Value Ref Range Status   Specimen Description   Final    BLOOD RIGHT ANTECUBITAL Performed at [facility] [address] [place], [place] [address]    Special Requests   Final    BOTTLES DRAWN AEROBIC AND ANAEROBIC Blood Culture results may not be optimal due to an inadequate volume of blood received in culture bottles Performed at [facility], [address] [place], [place] [address]    Culture  Setup Time   Final    GRAM NEGATIVE RODS IN BOTH AEROBIC AND ANAEROBIC BOTTLES CRITICAL VALUE NOTED.  VALUE IS CONSISTENT WITH PREVIOUSLY REPORTED AND CALLED VALUE. Performed at [facility], [address] [place], [place] [address]    Culture (A)  Final    ESCHERICHIA COLI SUSCEPTIBILITIES PERFORMED ON PREVIOUS CULTURE WITHIN THE LAST 5 DAYS. Performed at [facility], [address],  [place], [place] [address]    Report Status 12/18/2023 FINAL  Final  Urine Culture (for pregnant, neutropenic or urologic patients or patients with an indwelling urinary catheter)     Status: None   Collection Time: 12/15/23  5:40 PM   Specimen: Urine,  Clean Catch  Result Value Ref Range Status   Specimen Description   Final    URINE, CLEAN CATCH Performed at [facility], [address] [place], [place] [address]    Special Requests   Final    NONE Performed at [facility], [address], [place], [place] [address]    Culture   Final    NO GROWTH Performed at [facility], [address], [place], [place] [address]    Report Status 12/16/2023 FINAL  Final  MRSA Next Gen by PCR, Nasal     Status: None   Collection Time: 12/16/23  5:28 PM   Specimen: Nasal Mucosa; Nasal Swab  Result Value Ref Range Status   MRSA by PCR Next Gen NOT DETECTED NOT DETECTED Final    Comment: (NOTE) The GeneXpert MRSA Assay (FDA approved for NASAL specimens only), is one component of a comprehensive MRSA colonization surveillance program. It is not intended to diagnose MRSA infection nor to guide or monitor treatment for MRSA infections. Test performance is not FDA approved in patients less than 58 years old. Performed at [facility], [address] [place], [place] [address]     Procedures and diagnostic studies:  No results found.              LOS: 4 days   Delmi Fulfer  [facility]   Pager on [email or web address]. If 7PM-7AM, please contact night-coverage at [email or web address]     12/19/2023, 1:59 PM

## 2023-12-19 NOTE — Progress Notes (Signed)
 G-tube LDA not in place. 90ml intake for meds and flush. Dressing changed with some crusting noted.   Informed charge RN and unit secretory to order low air loss mattress for wound healing.

## 2023-12-19 NOTE — Progress Notes (Signed)
 [facility]  PROGRESS NOTE   Subjective:   Seen at bedside feels much better.  Objective:  Vital signs: Blood pressure (!) 119/59, pulse 68, temperature 98.6 F (37 C), resp. rate 11, height 5\' 2"  (1.575 m), weight 57 kg, SpO2 100%.  Intake/Output Summary (Last 24 hours) at 12/19/2023 1141 Last data filed at 12/19/2023 1033 Gross per 24 hour  Intake 598.1 ml  Output --  Net 598.1 ml   Filed Weights   12/18/23 0811 12/18/23 1208 12/19/23 0500  Weight: 57 kg 56 kg 57 kg     Physical Exam: General:  No acute distress  Head:  Normocephalic, atraumatic. Moist oral mucosal membranes  Eyes:  Anicteric  Neck:  Supple  Lungs:   Clear to auscultation, normal effort  Heart:  S1S2 no rubs  Abdomen:   Soft, nontender, bowel sounds present  Extremities:  peripheral edema.  Neurologic:  Awake, alert, following commands  Skin:  No lesions  Access:     Basic Metabolic Panel: Recent Labs  Lab 12/15/23 1819 12/16/23 0532 12/17/23 0610 12/18/23 0442  NA 142 139 138 139  K 3.6 3.6 3.5 3.7  CL 103 102 100 99  CO2 26 27 27 27   GLUCOSE 139* 110* 83 92  BUN 45* 53* 31* 51*  CREATININE 3.61* 4.13* 2.53* 3.38*  CALCIUM  8.8* 9.0 8.7* 8.8*   GFR: Estimated Creatinine Clearance: 12.6 mL/min (A) (by C-G formula based on SCr of 3.38 mg/dL (H)).  Liver Function Tests: Recent Labs  Lab 12/15/23 1819 12/16/23 0532 12/17/23 0610 12/18/23 0442  AST 214* 184* 137* 128*  ALT 67* 61* 44 38  ALKPHOS 1,035* 974* 829* 775*  BILITOT 0.9 1.4* 1.3* 0.9  PROT 6.9 6.9 6.5 6.7  ALBUMIN 2.1* 2.1* 2.0* 2.0*   No results for input(s): "LIPASE", "AMYLASE" in the last 168 hours. Recent Labs  Lab 12/15/23 1819  AMMONIA <13    CBC: Recent Labs  Lab 12/15/23 1819 12/16/23 0532 12/17/23 0610 12/18/23 0442  WBC 18.2* 21.3* 21.0* 16.2*  NEUTROABS 15.8*  --   --  14.0*  HGB 7.7* 6.7* 7.9* 8.2*  HCT 24.8* 22.3* 24.8* 25.5*  MCV 92.5 93.7 89.9 88.9  PLT 414* 415* 438* 407*      HbA1C: Hgb A1c MFr Bld  Date/Time Value Ref Range Status  05/09/2019 05:47 AM 5.4 4.8 - 5.6 % Final    Comment:    (NOTE) Pre diabetes:          5.7%-6.4% Diabetes:              >6.4% Glycemic control for   <7.0% adults with diabetes   09/13/2017 03:30 PM 5.3 4.8 - 5.6 % Final    Comment:             Prediabetes: 5.7 - 6.4          Diabetes: >6.4          Glycemic control for adults with diabetes: <7.0     Urinalysis: No results for input(s): "COLORURINE", "LABSPEC", "PHURINE", "GLUCOSEU", "HGBUR", "BILIRUBINUR", "KETONESUR", "PROTEINUR", "UROBILINOGEN", "NITRITE", "LEUKOCYTESUR" in the last 72 hours.  Invalid input(s): "APPERANCEUR"    Imaging: No results found.   Medications:    cefTRIAXone  (ROCEPHIN )  IV 2 g (12/19/23 2956)   metronidazole  500 mg (12/19/23 0913)    aspirin   81 mg Per Tube Daily   Chlorhexidine  Gluconate Cloth  6 each Topical Q0600   cloBAZam   5 mg Per Tube Q12H   diclofenac   100 mg Rectal Once   feeding supplement  237 mL Oral BID BM   heparin   5,000 Units Subcutaneous Q8H   lacosamide   200 mg Per Tube BID   levETIRAcetam   500 mg Per Tube BID   metroNIDAZOLE   500 mg Per Tube Q12H   mupirocin  ointment  1 Application Topical BID   nicotine   21 mg Transdermal Daily   omeprazole   20 mg Oral Daily   sertraline   25 mg Oral Daily   sevelamer  carbonate  800 mg Per Tube TID WC    Assessment/ Plan:     68 y.o.  female with past medical conditions including seizures, chronic biliary duct dilation, CHF, hypertension, stroke, diastolic heart failure, depression anxiety, bipolar, anemia, breast cancer status post right mastectomy, polypharmacy, end-stage renal disease on hemodialysis, who was admitted to [facility] on 12/15/2023 for  Elevated LFTs [R79.89], Liver lesion [K76.9], Dilation of biliary tract [K83.8], Sepsis (HCC) [A41.9]   #1: End-stage renal disease: Patient tolerated dialysis well yesterday.  She had 1 L fluid removal done yesterday.  She had  a right subclavian catheter.    #2: Secondary hyperparathyroidism: Will continue to monitor PTH, calcium  and phosphorus levels.  Continue sevelamer  as ordered.   #3: Anemia: Anemia of chronic kidney disease continue to follow anemia protocols.   #4: Sepsis: Patient has biliary sepsis with E. coli. Continue metronidazole  and Rocephin .   #4: Hypertension: Blood pressure is stable.  Now on hydralazine  and carvedilol  presently on hold.  Labs and medications reviewed. Will continue to follow along with you.   LOS: 4 Alera Quevedo, MD [facility] 6/8/202511:41 AM

## 2023-12-19 NOTE — Plan of Care (Signed)
  Problem: Education: Goal: Knowledge of General Education information will improve Description: Including pain rating scale, medication(s)/side effects and non-pharmacologic comfort measures 12/19/2023 0156 by Deryl Flora, RN Outcome: Progressing 12/19/2023 0156 by Deryl Flora, RN Outcome: Progressing   Problem: Clinical Measurements: Goal: Ability to maintain clinical measurements within normal limits will improve 12/19/2023 0156 by Deryl Flora, RN Outcome: Progressing 12/19/2023 0156 by Deryl Flora, RN Outcome: Progressing   Problem: Clinical Measurements: Goal: Will remain free from infection 12/19/2023 0156 by Deryl Flora, RN Outcome: Progressing 12/19/2023 0156 by Deryl Flora, RN Outcome: Progressing   Problem: Clinical Measurements: Goal: Diagnostic test results will improve 12/19/2023 0156 by Deryl Flora, RN Outcome: Progressing 12/19/2023 0156 by Deryl Flora, RN Outcome: Progressing   Problem: Clinical Measurements: Goal: Respiratory complications will improve 12/19/2023 0156 by Deryl Flora, RN Outcome: Progressing 12/19/2023 0156 by Deryl Flora, RN Outcome: Progressing   Problem: Clinical Measurements: Goal: Cardiovascular complication will be avoided 12/19/2023 0156 by Deryl Flora, RN Outcome: Progressing 12/19/2023 0156 by Deryl Flora, RN Outcome: Progressing   Problem: Activity: Goal: Risk for activity intolerance will decrease 12/19/2023 0156 by Deryl Flora, RN Outcome: Progressing 12/19/2023 0156 by Deryl Flora, RN Outcome: Progressing   Problem: Nutrition: Goal: Adequate nutrition will be maintained 12/19/2023 0156 by Deryl Flora, RN Outcome: Progressing 12/19/2023 0156 by Deryl Flora, RN Outcome: Progressing   Problem: Coping: Goal: Level of anxiety will decrease 12/19/2023 0156 by Deryl Flora, RN Outcome: Progressing 12/19/2023 0156 by Deryl Flora, RN Outcome: Progressing   Problem:  Elimination: Goal: Will not experience complications related to bowel motility 12/19/2023 0156 by Deryl Flora, RN Outcome: Progressing 12/19/2023 0156 by Deryl Flora, RN Outcome: Progressing   Problem: Safety: Goal: Ability to remain free from injury will improve 12/19/2023 0156 by Deryl Flora, RN Outcome: Progressing 12/19/2023 0156 by Deryl Flora, RN Outcome: Progressing   Problem: Skin Integrity: Goal: Risk for impaired skin integrity will decrease 12/19/2023 0156 by Deryl Flora, RN Outcome: Progressing 12/19/2023 0156 by Deryl Flora, RN Outcome: Progressing   Plan of care ongoing see [facility] see flowsheet

## 2023-12-20 ENCOUNTER — Telehealth: Payer: Self-pay

## 2023-12-20 DIAGNOSIS — R6520 Severe sepsis without septic shock: Secondary | ICD-10-CM | POA: Diagnosis not present

## 2023-12-20 DIAGNOSIS — S31000A Unspecified open wound of lower back and pelvis without penetration into retroperitoneum, initial encounter: Secondary | ICD-10-CM | POA: Diagnosis not present

## 2023-12-20 DIAGNOSIS — E440 Moderate protein-calorie malnutrition: Secondary | ICD-10-CM | POA: Insufficient documentation

## 2023-12-20 DIAGNOSIS — A419 Sepsis, unspecified organism: Secondary | ICD-10-CM | POA: Diagnosis not present

## 2023-12-20 LAB — COMPREHENSIVE METABOLIC PANEL WITH GFR
ALT: 28 U/L (ref 0–44)
AST: 91 U/L — ABNORMAL HIGH (ref 15–41)
Albumin: 2.2 g/dL — ABNORMAL LOW (ref 3.5–5.0)
Alkaline Phosphatase: 714 U/L — ABNORMAL HIGH (ref 38–126)
Anion gap: 14 (ref 5–15)
BUN: 41 mg/dL — ABNORMAL HIGH (ref 8–23)
CO2: 28 mmol/L (ref 22–32)
Calcium: 8.8 mg/dL — ABNORMAL LOW (ref 8.9–10.3)
Chloride: 95 mmol/L — ABNORMAL LOW (ref 98–111)
Creatinine, Ser: 2.92 mg/dL — ABNORMAL HIGH (ref 0.44–1.00)
GFR, Estimated: 17 mL/min — ABNORMAL LOW (ref >60)
Glucose, Bld: 86 mg/dL (ref 70–99)
Potassium: 2.9 mmol/L — ABNORMAL LOW (ref 3.5–5.1)
Sodium: 137 mmol/L (ref 135–145)
Total Bilirubin: 0.8 mg/dL (ref 0.0–1.2)
Total Protein: 7.1 g/dL (ref 6.5–8.1)

## 2023-12-20 LAB — BPAM RBC
Blood Product Expiration Date: 202506302359
Blood Product Expiration Date: 202506302359
Blood Product Expiration Date: 202506302359
ISSUE DATE / TIME: 202506051634
Unit Type and Rh: 5100
Unit Type and Rh: 5100
Unit Type and Rh: 5100

## 2023-12-20 LAB — TYPE AND SCREEN
ABO/RH(D): O POS
Antibody Screen: POSITIVE
DAT, IgG: NEGATIVE
DAT, complement: NEGATIVE
Unit division: 0
Unit division: 0
Unit division: 0

## 2023-12-20 LAB — CBC WITH DIFFERENTIAL/PLATELET
Abs Immature Granulocytes: 0.12 10*3/uL — ABNORMAL HIGH (ref 0.00–0.07)
Basophils Absolute: 0.1 10*3/uL (ref 0.0–0.1)
Basophils Relative: 0 %
Eosinophils Absolute: 0.1 10*3/uL (ref 0.0–0.5)
Eosinophils Relative: 1 %
HCT: 28.5 % — ABNORMAL LOW (ref 36.0–46.0)
Hemoglobin: 8.7 g/dL — ABNORMAL LOW (ref 12.0–15.0)
Immature Granulocytes: 1 %
Lymphocytes Relative: 10 %
Lymphs Abs: 1.3 10*3/uL (ref 0.7–4.0)
MCH: 27.6 pg (ref 26.0–34.0)
MCHC: 30.5 g/dL (ref 30.0–36.0)
MCV: 90.5 fL (ref 80.0–100.0)
Monocytes Absolute: 0.8 10*3/uL (ref 0.1–1.0)
Monocytes Relative: 6 %
Neutro Abs: 10.9 10*3/uL — ABNORMAL HIGH (ref 1.7–7.7)
Neutrophils Relative %: 82 %
Platelets: 439 10*3/uL — ABNORMAL HIGH (ref 150–400)
RBC: 3.15 MIL/uL — ABNORMAL LOW (ref 3.87–5.11)
RDW: 16.4 % — ABNORMAL HIGH (ref 11.5–15.5)
WBC: 13.2 10*3/uL — ABNORMAL HIGH (ref 4.0–10.5)
nRBC: 0 % (ref 0.0–0.2)

## 2023-12-20 MED ORDER — ZINC SULFATE 220 (50 ZN) MG PO CAPS
220.0000 mg | ORAL_CAPSULE | Freq: Every day | ORAL | Status: DC
  Administered 2023-12-20: 220 mg via ORAL
  Filled 2023-12-20: qty 1

## 2023-12-20 MED ORDER — RENA-VITE PO TABS
1.0000 | ORAL_TABLET | Freq: Every day | ORAL | Status: DC
  Filled 2023-12-20: qty 1

## 2023-12-20 MED ORDER — VITAMIN C 500 MG PO TABS
500.0000 mg | ORAL_TABLET | Freq: Two times a day (BID) | ORAL | Status: DC
  Administered 2023-12-20: 500 mg via ORAL
  Filled 2023-12-20: qty 1

## 2023-12-20 MED ORDER — VITAMIN C 500 MG PO TABS
500.0000 mg | ORAL_TABLET | Freq: Two times a day (BID) | ORAL | Status: DC
  Administered 2023-12-20 – 2023-12-24 (×8): 500 mg
  Filled 2023-12-20 (×8): qty 1

## 2023-12-20 MED ORDER — SENNOSIDES-DOCUSATE SODIUM 8.6-50 MG PO TABS
1.0000 | ORAL_TABLET | Freq: Every evening | ORAL | Status: DC | PRN
  Administered 2023-12-22: 1
  Filled 2023-12-20: qty 1

## 2023-12-20 MED ORDER — POTASSIUM CHLORIDE CRYS ER 20 MEQ PO TBCR
40.0000 meq | EXTENDED_RELEASE_TABLET | Freq: Once | ORAL | Status: AC
  Administered 2023-12-20: 40 meq via ORAL
  Filled 2023-12-20: qty 2

## 2023-12-20 MED ORDER — SODIUM CHLORIDE 0.9 % IV SOLN: INTRAVENOUS | Status: AC | PRN

## 2023-12-20 MED ORDER — RENA-VITE PO TABS
1.0000 | ORAL_TABLET | Freq: Every day | ORAL | Status: DC
  Administered 2023-12-20 – 2023-12-23 (×4): 1
  Filled 2023-12-20 (×3): qty 1

## 2023-12-20 MED ORDER — SERTRALINE HCL 50 MG PO TABS
25.0000 mg | ORAL_TABLET | Freq: Every day | ORAL | Status: DC
  Administered 2023-12-21 – 2023-12-24 (×4): 25 mg
  Filled 2023-12-20 (×4): qty 1

## 2023-12-20 MED ORDER — OMEPRAZOLE 2 MG/ML ORAL SUSPENSION
20.0000 mg | Freq: Every day | ORAL | Status: DC
  Administered 2023-12-21 – 2023-12-24 (×4): 20 mg
  Filled 2023-12-20 (×5): qty 10

## 2023-12-20 MED ORDER — ADULT MULTIVITAMIN W/MINERALS CH: 1.0000 | ORAL_TABLET | Freq: Every day | ORAL | Status: DC

## 2023-12-20 NOTE — Plan of Care (Signed)

## 2023-12-20 NOTE — Consult Note (Addendum)
 WOC Nurse Consult Note: Reason for Consult: Requested a VAC on sacrum wound. Wound type: Pressure injury stage 3. Pressure Injury POA: Yes Measurement: 10.5 x 8.5 x 3 cm. Wound bed: 60% dark red, 40% yellow/brown slough. Drainage (amount, consistency, odor) Moderated amount. New wet to dry dressing, cover with foam dressing when I arrived. Periwound: Intact, white scars on the edges for previously injuries. Dressing procedure/placement/frequency:  Removed old NPWT dressing Cleansed wound with normal saline Periwound skin protected with skin barrier wipe. Applied a Ring on the bottom of the wound, close to her anus, to avoid leaking. Filled wound with 1 piece of black foam  Sealed NPWT dressing at HG/133mmHG  Patient did not received IV/PO pain medication per bedside nurse prior to dressing change Patient tolerated procedure well  WOC nurse will continue to provide NPWT dressing changed due to the complexity of the dressing change.   Changes MON/THURS.  WOC team will follow further.   Please reconsult if further assistance is needed. Thank-you,  Rachel Budds BSN, RN, [employer], WOC  (Pager: [phone])

## 2023-12-20 NOTE — Telephone Encounter (Signed)
-----   Message from Marnee Sink sent at 12/17/2023  7:55 PM EDT ----- Ercp for stent removal in 2 months.

## 2023-12-20 NOTE — Consult Note (Signed)
 [facility] SURGICAL CONSULTATION NOTE (initial) - cpt: 16109   HISTORY OF PRESENT ILLNESS (HPI):  History limited secondary to AMS. History obtained through chart review and discussion with members of her medical team.   68 y.o. female initially presenting to [facility] ED on 06/04 secondary to AMS. Patient was found to have SIRS criteria with source not clear cut. Thought to be UTI. Work up also concerning for biliary duct dilatation with several small hypodense nodular areas in the liver concerning for intrahepatic abscesses vs cholangitis. She was also noted to have large sacral wound. Patient is unable to provide much history on this today. She was admitted to the medicine service. She did undergo ERCP on 06/06 with Dr Ole Berkeley which revealed sludge in the biliary tree, stent placed. She is again s/p cholecystectomy. Most recent labs revealed WBC to 13.2K (which is improving), Hgb to 8.7; stable. sCr - 2.92; UO - unmeasured. Hypokalemia to 2.9. She is currently on Rocephin  and Flagyl . Bcx and all other Cx reviewed.   Surgery is consulted by hospitalist physician Dr. Sheril Dines, MD in this context for evaluation and management of sacral ulceration.  PAST MEDICAL HISTORY (PMH):  Past Medical History:  Diagnosis Date   Alcohol  use disorder, severe, dependence (HCC) 08/27/2017   Anxiety    Body mass index (BMI) 40.0-44.9, adult (HCC) 07/19/2020   Breast cancer (HCC)    right lumpectemy and lymph node    Cervical spondylosis 05/24/2020   Chronic bilateral low back pain with right-sided sciatica 05/24/2020   Chronic diastolic CHF (congestive heart failure) (HCC) 05/09/2019   Cigarette smoker 12/31/2017   ESRD (end stage renal disease) on dialysis (HCC) 08/22/2021   M-W-F   Essential hypertension 07/06/2017   GERD (gastroesophageal reflux disease)    Hepatitis C virus infection cured after antiviral drug therapy 12/17/2012   Telephone Encounter - Nerissa Bannister, RN - 12/28/2016 9:54  AM EDT Patient called for HCV RNA test results. EOT 08-26-16 - not detected. 12 week post treatment 12-11-16 - not detected. Informed patient that she was cured. Patient verbalized understanding.          Hx of bipolar disorder 01/31/2016   Impaired functional mobility, balance, gait, and endurance 01/09/2021   MRSA infection    of breast incision   Neuroleptic-induced tardive dyskinesia 09/06/2017   Abilify   Opioid use disorder, severe, dependence (HCC) 08/27/2017   Restless legs syndrome 02/13/2013   Formatting of this note might be different from the original. IMPRESSION: Possible. Will follow.   Subacute dyskinesia due to drug 02/13/2013   Formatting of this note might be different from the original. STORY: Tardive dyskinesia and mild limb dyskinesia, LE>UE which likely due to prolonged antipsychotic used, abilify. Couldn't tolerate artane, gabapentin , clonazepam and xenazine.`E1o3L`IMPRESSION: Well tolerated depakote  250mg  bid, mood aspect is better as well. Will increase to 500mg  bid. ADR was discussed.  RTC 4-6 weeks.   TIA (transient ischemic attack) 2020   P/w BP >230/120 and neurologic symptoms, presumed TIA     PAST SURGICAL HISTORY [facility]):  Past Surgical History:  Procedure Laterality Date   A/V FISTULAGRAM Right 01/29/2022   Procedure: A/V Fistulagram;  Surgeon: Young Hensen, MD;  Location: [facility] INVASIVE CV LAB;  Service: Cardiovascular;  Laterality: Right;   AV FISTULA PLACEMENT Right 05/01/2021   Procedure: RIGHT ARTERIOVENOUS (AV) FISTULA CREATION;  Surgeon: Kayla Part, MD;  Location: [facility] OR;  Service: Vascular;  Laterality: Right;  PERIPHERAL NERVE BLOCK   BACK SURGERY  1990   BASCILIC VEIN TRANSPOSITION Right 09/02/2021   Procedure: RIGHT SECOND STAGE BASILIC VEIN TRANSPOSITION;  Surgeon: Kayla Part, MD;  Location: [facility] OR;  Service: Vascular;  Laterality: Right;  PERIPHERAL NERVE BLOCK   BILIARY STENT PLACEMENT  12/17/2023   Procedure: INSERTION, STENT, BILE  DUCT;  Surgeon: Marnee Sink, MD;  Location: [facility] ENDOSCOPY;  Service: Endoscopy;;   BREAST SURGERY Right 2010   "breast cancer survivor" - states partial mastectomy and nodes   COLONOSCOPY     [facility]   ERCP N/A 12/17/2023   Procedure: ERCP, WITH INTERVENTION IF INDICATED;  Surgeon: Marnee Sink, MD;  Location: [facility] ENDOSCOPY;  Service: Endoscopy;  Laterality: N/A;   ESOPHAGOGASTRODUODENOSCOPY     [facility]   ESOPHAGOGASTRODUODENOSCOPY  04/18/2020   [facility]   LAPAROSCOPIC CHOLECYSTECTOMY  2002   LAPAROSCOPIC INCISIONAL / UMBILICAL / VENTRAL HERNIA REPAIR  04/13/2018   with BARD 15x 20cm mesh (supraumbilical)   LAPAROSCOPIC LYSIS OF ADHESIONS  07/12/2018   Procedure: LAPAROSCOPIC LYSIS OF ADHESIONS;  Surgeon: Lyn Sanders, MD;  Location: [facility] ORS;  Service: Gynecology;;   MULTIPLE TOOTH EXTRACTIONS     MYOMECTOMY     x 2 prior to hysterectomy   PERIPHERAL VASCULAR BALLOON ANGIOPLASTY Right 01/29/2022   Procedure: PERIPHERAL VASCULAR BALLOON ANGIOPLASTY;  Surgeon: Young Hensen, MD;  Location: [facility] INVASIVE CV LAB;  Service: Cardiovascular;  Laterality: Right;  arm fistula   ROBOTIC ASSISTED BILATERAL SALPINGO OOPHERECTOMY Right 07/12/2018   Procedure: XI ROBOTIC ASSISTED RIGHT SALPINGO OOPHORECTOMY;  Surgeon: Lyn Sanders, MD;  Location: [facility] ORS;  Service: Gynecology;  Laterality: Right;   STONE EXTRACTION WITH BASKET  12/17/2023   Procedure: ERCP, WITH LITHROTRIPSY OR REMOVAL OF COMMON BILE DUCT CALCULUS USING BASKET;  Surgeon: Marnee Sink, MD;  Location: [facility] ENDOSCOPY;  Service: Endoscopy;;   TOTAL ABDOMINAL HYSTERECTOMY     fibroids    UPPER GASTROINTESTINAL ENDOSCOPY     WISDOM TOOTH EXTRACTION       MEDICATIONS:  Prior to Admission medications   Medication Sig Start Date End Date Taking? Authorizing Provider  Acetaminophen  325 MG CAPS Take 1 tablet by mouth every 6 (six) hours as needed. 12/30/21  Yes [provider]  aspirin  81 MG  chewable tablet Place 81 mg into feeding tube daily.   Yes [provider]  carvedilol  (COREG ) 12.5 MG tablet Take 25 mg by mouth 2 (two) times daily.   Yes [provider]  cetirizine HCl (ZYRTEC) 1 MG/ML solution Take 10 mg by mouth daily.   Yes [provider]  cloBAZam  (ONFI ) 2.5 MG/ML solution Place 2 mLs into feeding tube every 12 (twelve) hours. 12/11/23  Yes [provider]  diphenhydrAMINE  (BENADRYL ) 25 mg capsule Place 25 mg into feeding tube every 6 (six) hours as needed.   Yes [provider]  gabapentin  (NEURONTIN ) 100 MG capsule 100 mg 3 (three) times daily. Via G-tube   Yes [provider]  heparin  5000 UNIT/ML injection Inject 5,000 Units into the skin once.   Yes [provider]  hydrALAZINE  (APRESOLINE ) 50 MG tablet Take 50 mg by mouth 3 (three) times daily. 03/24/21  Yes [provider]  hydrocortisone  cream 1 % Apply 1 Application topically 2 (two) times daily.   Yes [provider]  hydrOXYzine  (ATARAX ) 25 MG tablet Take 25 mg by mouth 3 (three) times daily as needed.   Yes [provider]  lacosamide  (VIMPAT ) 200 MG TABS tablet Place 200  mg into feeding tube 2 (two) times daily.   Yes [provider]  levETIRAcetam  (KEPPRA ) 100 MG/ML solution Place 5 mLs into feeding tube 2 (two) times daily.   Yes [provider]  mupirocin  ointment (BACTROBAN ) 2 % Apply 1 Application topically 2 (two) times daily.   Yes [provider]  omeprazole  (PRILOSEC OTC) 20 MG tablet Take 20 mg by mouth daily. Per G-tube   Yes [provider]  ondansetron  (ZOFRAN ) 4 MG/5ML solution Place 5 mLs into feeding tube daily.   Yes [provider]  oxycodone  (OXY-IR) 5 MG capsule Take 5 mg by mouth every 4 (four) hours as needed for pain.   Yes [provider]  polyethylene glycol (MIRALAX  / GLYCOLAX ) 17 g packet Take 17 g by mouth daily.   Yes [provider]   sertraline  (ZOLOFT ) 25 MG tablet Take 25 mg by mouth daily.   Yes [provider]  sevelamer  carbonate (RENVELA ) 800 MG tablet Place 800 mg into feeding tube 3 (three) times daily with meals.   Yes [provider]  albuterol  (VENTOLIN  HFA) 108 (90 Base) MCG/ACT inhaler Inhale 2 puffs into the lungs every 4 (four) hours as needed. 05/24/23   [provider]  diazepam  (VALIUM ) 10 MG tablet Take 0.5 tablets (5 mg total) by mouth as directed. Patient not taking: Reported on 12/15/2023 05/27/23   McLendon, Michael, MD  pantoprazole  (PROTONIX ) 40 MG tablet Take 1 tablet (40 mg total) by mouth daily. 02/01/22 05/26/23  Ozell Blunt, MD  senna-docusate (SENOKOT-S) 8.6-50 MG tablet Take 1 tablet by mouth at bedtime as needed for mild constipation. 03/02/23   Long, Shereen Dike, MD  traMADol  (ULTRAM ) 50 MG tablet Take 50-100 mg by mouth 3 (three) times daily as needed for severe pain. Patient not taking: Reported on 12/15/2023    [provider]     ALLERGIES:  Allergies  Allergen Reactions   Abilify [Aripiprazole] Other (See Comments)    Tardive dyskinesia Oral   Remeron  [Mirtazapine ] Other (See Comments)    Wgt stimulation /gain, Dizziness, Patient says "can tolerate"   Trazodone  And Nefazodone Other (See Comments)    Nightmares/sleep diturbance   Augmentin [Amoxicillin -Pot Clavulanate]    Flexeril [Cyclobenzaprine] Other (See Comments)    Pt states Flexeril makes her feel depressed    Amoxicillin  Diarrhea     SOCIAL HISTORY:  Social History   Socioeconomic History   Marital status: Single    Spouse name: Not on file   Number of children: 0   Years of education: 12   Highest education level: High school graduate  Occupational History   Occupation: Disabled  Tobacco Use   Smoking status: Every Day    Current packs/day: 0.50    Types: Cigarettes    Passive exposure: Never   Smokeless tobacco: Never  Vaping Use   Vaping status: Former   Start date:  06/27/2013   Quit date: 06/14/2017   Devices: Apple Cinnamon-0 mg  Substance and Sexual Activity   Alcohol  use: Not Currently    Comment: hx over 30 years   Drug use: Not Currently    Comment: h/o IVD use   Sexual activity: Not Currently  Other Topics Concern   Not on file  Social History Narrative   Lives at home alone.   Right-handed.   Caffeine  use: 4 cups coffee/soda   Social Drivers of Corporate investment banker Strain: Not on file  Food Insecurity: No Food Insecurity (12/16/2023)  Hunger Vital Sign    Worried About Running Out of Food in the Last Year: Never true    Ran Out of Food in the Last Year: Never true  Transportation Needs: No Transportation Needs (12/16/2023)   PRAPARE - Administrator, Civil Service (Medical): No    Lack of Transportation (Non-Medical): No  Physical Activity: Not on file  Stress: Not on file  Social Connections: Patient Unable To Answer (12/16/2023)   Social Connection and Isolation Panel [NHANES]    Frequency of Communication with Friends and Family: Patient unable to answer    Frequency of Social Gatherings with Friends and Family: Patient unable to answer    Attends Religious Services: Patient unable to answer    Active Member of Clubs or Organizations: Patient unable to answer    Attends Banker Meetings: Patient unable to answer    Marital Status: Patient unable to answer  Intimate Partner Violence: Not At Risk (12/16/2023)   Humiliation, Afraid, Rape, and Kick questionnaire    Fear of Current or Ex-Partner: No    Emotionally Abused: No    Physically Abused: No    Sexually Abused: No     FAMILY HISTORY:  Family History  Problem Relation Age of Onset   Heart attack Mother    Breast cancer Mother 61   Dementia Mother    Cancer Father 50       died of bleeding from kidneys   Colon cancer Neg Hx    Esophageal cancer Neg Hx    Stomach cancer Neg Hx    Rectal cancer Neg Hx       REVIEW OF SYSTEMS:  Review  of Systems  Unable to perform ROS: Mental status change (AMS)  Skin:        + Sacral Wound     VITAL SIGNS:  Temp:  [97.7 F (36.5 C)-98.8 F (37.1 C)] 98 F (36.7 C) (06/09 0800) Pulse Rate:  [72-81] 75 (06/09 0800) Resp:  [15-17] 16 (06/09 0800) BP: (121-152)/(65-78) 152/78 (06/09 0800) SpO2:  [100 %] 100 % (06/09 0800) Weight:  [56.8 kg] 56.8 kg (06/09 0616)     Height: 5\' 2"  (157.5 cm) Weight: 56.8 kg BMI (Calculated): 22.9   INTAKE/OUTPUT:  06/08 0701 - 06/09 0700 In: 658.1 [IV Piggyback:598.1] Out: -   PHYSICAL EXAM:  Physical Exam Vitals and nursing note reviewed. Exam conducted with a chaperone present.  Constitutional:      General: She is not in acute distress.    Appearance: She is not ill-appearing.     Comments: Patient alert, confused, NAD  HENT:     Head: Normocephalic and atraumatic.  Eyes:     General: No scleral icterus.    Conjunctiva/sclera: Conjunctivae normal.  Cardiovascular:     Rate and Rhythm: Normal rate.     Pulses: Normal pulses.  Pulmonary:     Effort: Pulmonary effort is normal. No respiratory distress.  Genitourinary:    Comments: Deferred Skin:    General: Skin is warm and dry.     Findings: Wound present.     Comments: Large sacral ulceration, wound bed appears healthy with scant scattered areas of slough, no evidence of necrosis or undrained collections   Neurological:     Mental Status: She is alert.     Comments: Unable to reliably assess   Psychiatric:     Comments: Unable to reliably assess       Sacral Wound (12/20/2023):    Labs:  Latest Ref Rng & Units 12/20/2023    1:14 AM 12/18/2023    4:42 AM 12/17/2023    6:10 AM  CBC  WBC 4.0 - 10.5 K/uL 13.2  16.2  21.0   Hemoglobin 12.0 - 15.0 g/dL 8.7  8.2  7.9   Hematocrit 36.0 - 46.0 % 28.5  25.5  24.8   Platelets 150 - 400 K/uL 439  407  438       Latest Ref Rng & Units 12/20/2023    1:14 AM 12/18/2023    4:42 AM 12/17/2023    6:10 AM  CMP  Glucose 70 - 99 mg/dL 86   92  83   BUN 8 - 23 mg/dL 41  51  31   Creatinine 0.44 - 1.00 mg/dL 2.13  0.86  5.78   Sodium 135 - 145 mmol/L 137  139  138   Potassium 3.5 - 5.1 mmol/L 2.9  3.7  3.5   Chloride 98 - 111 mmol/L 95  99  100   CO2 22 - 32 mmol/L 28  27  27    Calcium  8.9 - 10.3 mg/dL 8.8  8.8  8.7   Total Protein 6.5 - 8.1 g/dL 7.1  6.7  6.5   Total Bilirubin 0.0 - 1.2 mg/dL 0.8  0.9  1.3   Alkaline Phos 38 - 126 U/L 714  775  829   AST 15 - 41 U/L 91  128  137   ALT 0 - 44 U/L 28  38  44      Imaging studies:  No new pertinent imaging studies    Assessment/Plan:  68 y.o. female with large sacral ulceration, complicated by AMS, failure to thrive, deconditioning    - Wound Care: I will reach out to [facility] RN regarding wound vac placement for this sacral wound if feasible. Should be reasonable to to biweekly schedule if staffing/resource constraints. In the interim, it is okay to continue wound care as outlined.   - Needs pressure offloading, frequent repositioning, consider low air loss mattress  - Continue IV Abx  - Pain control prn   - Further management per primary service; we ill be available as needed    All of the above findings and recommendations were discussed with the patient and the medical team.   Thank you for the opportunity to participate in this patient's care.   -- Apolonio Bay, PA-C [facility] 12/20/2023, 8:05 AM M-F: 7am - 4pm

## 2023-12-20 NOTE — Progress Notes (Signed)
 The patient's liver enzymes continue to improve.  The patient will need a repeat ERCP for stent removal in 2 to 3 months in my office has been made aware and will contact her.  Nothing further to do from a GI point of view.  I will sign off.  Please call if any further GI concerns or questions.  We would like to thank you for the opportunity to participate in the care of [facility].

## 2023-12-20 NOTE — Care Management Important Message (Signed)
 Important Message  Patient Details  Name: Sherry [name] MRN: [id number] Date of Birth: 1955/10/28   Important Message Given:  Yes - Medicare IM     Anise Kerns 12/20/2023, 2:51 PM

## 2023-12-20 NOTE — Progress Notes (Signed)
 [facility]  ROUNDING NOTE   Subjective:   Patient seen sitting up in bed Breakfast tray at bedside Patient refused tray once set up Alert and oriented today Asking appropriate questions about why she was admitted, what's happening now with her care.     Objective:  Vital signs in last 24 hours:  Temp:  [97.7 F (36.5 C)-98.8 F (37.1 C)] 98 F (36.7 C) (06/09 0800) Pulse Rate:  [72-81] 75 (06/09 0800) Resp:  [15-17] 16 (06/09 0800) BP: (121-152)/(65-78) 152/78 (06/09 0800) SpO2:  [100 %] 100 % (06/09 0800) Weight:  [56.8 kg] 56.8 kg (06/09 0616)  Weight change: -0.2 kg Filed Weights   12/18/23 1208 12/19/23 0500 12/20/23 0616  Weight: 56 kg 57 kg 56.8 kg    Intake/Output: I/O last 3 completed shifts: In: 658.1 [Other:60; IV Piggyback:598.1] Out: -    Intake/Output this shift:  No intake/output data recorded.  Physical Exam: General: NAD  Head: Normocephalic, atraumatic. Moist oral mucosal membranes  Eyes: Anicteric  Lungs:  Clear to auscultation  Heart: Regular rate and rhythm  Abdomen:  Soft, nontender  Extremities:  No peripheral edema.  Neurologic: Awake, alert  Skin: Warm,dry, large sacral wound  Access: Rt internal jugular permcath    Basic Metabolic Panel: Recent Labs  Lab 12/15/23 1819 12/16/23 0532 12/17/23 0610 12/18/23 0442 12/20/23 0114  NA 142 139 138 139 137  K 3.6 3.6 3.5 3.7 2.9*  CL 103 102 100 99 95*  CO2 26 27 27 27 28   GLUCOSE 139* 110* 83 92 86  BUN 45* 53* 31* 51* 41*  CREATININE 3.61* 4.13* 2.53* 3.38* 2.92*  CALCIUM  8.8* 9.0 8.7* 8.8* 8.8*    Liver Function Tests: Recent Labs  Lab 12/15/23 1819 12/16/23 0532 12/17/23 0610 12/18/23 0442 12/20/23 0114  AST 214* 184* 137* 128* 91*  ALT 67* 61* 44 38 28  ALKPHOS 1,035* 974* 829* 775* 714*  BILITOT 0.9 1.4* 1.3* 0.9 0.8  PROT 6.9 6.9 6.5 6.7 7.1  ALBUMIN 2.1* 2.1* 2.0* 2.0* 2.2*   No results for input(s): "LIPASE", "AMYLASE" in the last 168  hours. Recent Labs  Lab 12/15/23 1819  AMMONIA <13    CBC: Recent Labs  Lab 12/15/23 1819 12/16/23 0532 12/17/23 0610 12/18/23 0442 12/20/23 0114  WBC 18.2* 21.3* 21.0* 16.2* 13.2*  NEUTROABS 15.8*  --   --  14.0* 10.9*  HGB 7.7* 6.7* 7.9* 8.2* 8.7*  HCT 24.8* 22.3* 24.8* 25.5* 28.5*  MCV 92.5 93.7 89.9 88.9 90.5  PLT 414* 415* 438* 407* 439*    Cardiac Enzymes: Recent Labs  Lab 12/15/23 1819  CKTOTAL 11*    BNP: Invalid input(s): "POCBNP"  CBG: Recent Labs  Lab 12/16/23 0917  GLUCAP 111*    Microbiology: Results for orders placed or performed during the hospital encounter of 12/15/23  Resp panel by RT-PCR (RSV, Flu A&B, Covid) Anterior Nasal Swab     Status: None   Collection Time: 12/15/23  4:11 PM   Specimen: Anterior Nasal Swab  Result Value Ref Range Status   SARS Coronavirus 2 by RT PCR NEGATIVE NEGATIVE Final    Comment: (NOTE) SARS-CoV-2 target nucleic acids are NOT DETECTED.  The SARS-CoV-2 RNA is generally detectable in upper respiratory specimens during the acute phase of infection. The lowest concentration of SARS-CoV-2 viral copies this assay can detect is 138 copies/mL. A negative result does not preclude SARS-Cov-2 infection and should not be used as the sole basis for treatment or other patient management  decisions. A negative result may occur with  improper specimen collection/handling, submission of specimen other than nasopharyngeal swab, presence of viral mutation(s) within the areas targeted by this assay, and inadequate number of viral copies(<138 copies/mL). A negative result must be combined with clinical observations, patient history, and epidemiological information. The expected result is Negative.  Fact Sheet for Patients:  BloggerCourse.com  Fact Sheet for Healthcare Providers:  SeriousBroker.it  This test is no t yet approved or cleared by the [place]  FDA and  has  been authorized for detection and/or diagnosis of SARS-CoV-2 by FDA under an Emergency Use Authorization (EUA). This EUA will remain  in effect (meaning this test can be used) for the duration of the COVID-19 declaration under Section 564(b)(1) of the Act, 21 U.S.C.section 360bbb-3(b)(1), unless the authorization is terminated  or revoked sooner.       Influenza A by PCR NEGATIVE NEGATIVE Final   Influenza B by PCR NEGATIVE NEGATIVE Final    Comment: (NOTE) The Xpert Xpress SARS-CoV-2/FLU/RSV plus assay is intended as an aid in the diagnosis of influenza from Nasopharyngeal swab specimens and should not be used as a sole basis for treatment. Nasal washings and aspirates are unacceptable for Xpert Xpress SARS-CoV-2/FLU/RSV testing.  Fact Sheet for Patients: BloggerCourse.com  Fact Sheet for Healthcare Providers: SeriousBroker.it  This test is not yet approved or cleared by the [place]  FDA and has been authorized for detection and/or diagnosis of SARS-CoV-2 by FDA under an Emergency Use Authorization (EUA). This EUA will remain in effect (meaning this test can be used) for the duration of the COVID-19 declaration under Section 564(b)(1) of the Act, 21 U.S.C. section 360bbb-3(b)(1), unless the authorization is terminated or revoked.     Resp Syncytial Virus by PCR NEGATIVE NEGATIVE Final    Comment: (NOTE) Fact Sheet for Patients: BloggerCourse.com  Fact Sheet for Healthcare Providers: SeriousBroker.it  This test is not yet approved or cleared by the [place]  FDA and has been authorized for detection and/or diagnosis of SARS-CoV-2 by FDA under an Emergency Use Authorization (EUA). This EUA will remain in effect (meaning this test can be used) for the duration of the COVID-19 declaration under Section 564(b)(1) of the Act, 21 U.S.C. section 360bbb-3(b)(1), unless the  authorization is terminated or revoked.  Performed at [facility], [address], [place], [place] [address]   Blood Culture (routine x 2)     Status: Abnormal   Collection Time: 12/15/23  4:12 PM   Specimen: BLOOD  Result Value Ref Range Status   Specimen Description   Final    BLOOD RIGHT ANTECUBITAL Performed at [facility], [address] [place], [place] [address]    Special Requests   Final    BOTTLES DRAWN AEROBIC AND ANAEROBIC Blood Culture results may not be optimal due to an inadequate volume of blood received in culture bottles Performed at [facility], [address], [place], [place] [address]    Culture  Setup Time   Final    GRAM NEGATIVE RODS IN BOTH AEROBIC AND ANAEROBIC BOTTLES Organism ID to follow CRITICAL RESULT CALLED TO, READ BACK BY AND VERIFIED WITHLatina Pol AT 7829 12/16/23 JG Performed at [facility], [address] [place], [place] [address]    Culture ESCHERICHIA COLI (A)  Final   Report Status 12/18/2023 FINAL  Final   Organism ID, Bacteria ESCHERICHIA COLI  Final   Organism ID, Bacteria ESCHERICHIA COLI  Final  Susceptibility   Escherichia coli - KIRBY BAUER*    CEFAZOLIN  RESISTANT Resistant    Escherichia coli - MIC*    AMPICILLIN >=32 RESISTANT Resistant     CEFEPIME  <=0.12 SENSITIVE Sensitive     CEFTAZIDIME <=1 SENSITIVE Sensitive     CEFTRIAXONE  <=0.25 SENSITIVE Sensitive     CIPROFLOXACIN >=4 RESISTANT Resistant     GENTAMICIN <=1 SENSITIVE Sensitive     IMIPENEM <=0.25 SENSITIVE Sensitive     TRIMETH /SULFA  <=20 SENSITIVE Sensitive     AMPICILLIN/SULBACTAM >=32 RESISTANT Resistant     PIP/TAZO <=4 SENSITIVE Sensitive ug/mL    * ESCHERICHIA COLI    ESCHERICHIA COLI  Aerobic/Anaerobic Culture w Gram Stain (surgical/deep wound)     Status: None   Collection Time: 12/15/23  4:12 PM   Specimen: Wound  Result Value Ref Range Status   Specimen Description   Final    WOUND Performed  at [facility], [address], [place], [place] [address]    Special Requests   Final    SACRAL Performed at [facility], [address] [place], [place] [address]    Gram Stain NO WBC SEEN RARE GRAM NEGATIVE RODS   Final   Culture   Final    ABUNDANT KLEBSIELLA PNEUMONIAE FEW ESCHERICHIA COLI FEW BACTEROIDES FRAGILIS BETA LACTAMASE POSITIVE FEW ENTEROCOCCUS FAECALIS FEW CORYNEBACTERIUM STRIATUM Standardized susceptibility testing for this organism is not available. Performed at [facility], [address], [place], [place] [address]    Report Status 12/19/2023 FINAL  Final   Organism ID, Bacteria KLEBSIELLA PNEUMONIAE  Final   Organism ID, Bacteria ESCHERICHIA COLI  Final   Organism ID, Bacteria ENTEROCOCCUS FAECALIS  Final      Susceptibility   Escherichia coli - MIC*    AMPICILLIN >=32 RESISTANT Resistant     CEFEPIME  <=0.12 SENSITIVE Sensitive     CEFTAZIDIME <=1 SENSITIVE Sensitive     CEFTRIAXONE  <=0.25 SENSITIVE Sensitive     CIPROFLOXACIN >=4 RESISTANT Resistant     GENTAMICIN <=1 SENSITIVE Sensitive     IMIPENEM <=0.25 SENSITIVE Sensitive     TRIMETH /SULFA  <=20 SENSITIVE Sensitive     AMPICILLIN/SULBACTAM >=32 RESISTANT Resistant     PIP/TAZO <=4 SENSITIVE Sensitive ug/mL    * FEW ESCHERICHIA COLI   Enterococcus faecalis - MIC*    AMPICILLIN <=2 SENSITIVE Sensitive     VANCOMYCIN  >=32 RESISTANT Resistant     GENTAMICIN SYNERGY SENSITIVE Sensitive     * FEW ENTEROCOCCUS FAECALIS   Klebsiella pneumoniae - MIC*    AMPICILLIN RESISTANT Resistant     CEFEPIME  <=0.12 SENSITIVE Sensitive     CEFTAZIDIME <=1 SENSITIVE Sensitive     CEFTRIAXONE  <=0.25 SENSITIVE Sensitive     CIPROFLOXACIN <=0.25 SENSITIVE Sensitive     GENTAMICIN <=1 SENSITIVE Sensitive     IMIPENEM <=0.25 SENSITIVE Sensitive     TRIMETH /SULFA  <=20 SENSITIVE Sensitive     AMPICILLIN/SULBACTAM <=2 SENSITIVE Sensitive     PIP/TAZO <=4 SENSITIVE Sensitive ug/mL    *  ABUNDANT KLEBSIELLA PNEUMONIAE  Blood Culture ID Panel (Reflexed)     Status: Abnormal   Collection Time: 12/15/23  4:12 PM  Result Value Ref Range Status   Enterococcus faecalis NOT DETECTED NOT DETECTED Final   Enterococcus Faecium NOT DETECTED NOT DETECTED Final   Listeria monocytogenes NOT DETECTED NOT DETECTED Final   Staphylococcus species NOT DETECTED NOT DETECTED Final   Staphylococcus aureus (BCID) NOT DETECTED NOT DETECTED Final   Staphylococcus epidermidis NOT DETECTED NOT  DETECTED Final   Staphylococcus lugdunensis NOT DETECTED NOT DETECTED Final   Streptococcus species NOT DETECTED NOT DETECTED Final   Streptococcus agalactiae NOT DETECTED NOT DETECTED Final   Streptococcus pneumoniae NOT DETECTED NOT DETECTED Final   Streptococcus pyogenes NOT DETECTED NOT DETECTED Final   A.calcoaceticus-baumannii NOT DETECTED NOT DETECTED Final   Bacteroides fragilis NOT DETECTED NOT DETECTED Final   Enterobacterales DETECTED (A) NOT DETECTED Final    Comment: Enterobacterales represent a large order of gram negative bacteria, not a single organism. CRITICAL RESULT CALLED TO, READ BACK BY AND VERIFIED WITH:  JASON ROBINS AT 2440 12/16/23 JG    Enterobacter cloacae complex NOT DETECTED NOT DETECTED Final   Escherichia coli DETECTED (A) NOT DETECTED Final    Comment: CRITICAL RESULT CALLED TO, READ BACK BY AND VERIFIED WITH:  JASON ROBINS AT 0529 12/16/23 JG    Klebsiella aerogenes NOT DETECTED NOT DETECTED Final   Klebsiella oxytoca NOT DETECTED NOT DETECTED Final   Klebsiella pneumoniae NOT DETECTED NOT DETECTED Final   Proteus species NOT DETECTED NOT DETECTED Final   Salmonella species NOT DETECTED NOT DETECTED Final   Serratia marcescens NOT DETECTED NOT DETECTED Final   Haemophilus influenzae NOT DETECTED NOT DETECTED Final   Neisseria meningitidis NOT DETECTED NOT DETECTED Final   Pseudomonas aeruginosa NOT DETECTED NOT DETECTED Final   Stenotrophomonas maltophilia NOT DETECTED  NOT DETECTED Final   Candida albicans NOT DETECTED NOT DETECTED Final   Candida auris NOT DETECTED NOT DETECTED Final   Candida glabrata NOT DETECTED NOT DETECTED Final   Candida krusei NOT DETECTED NOT DETECTED Final   Candida parapsilosis NOT DETECTED NOT DETECTED Final   Candida tropicalis NOT DETECTED NOT DETECTED Final   Cryptococcus neoformans/gattii NOT DETECTED NOT DETECTED Final   CTX-M ESBL NOT DETECTED NOT DETECTED Final   Carbapenem resistance IMP NOT DETECTED NOT DETECTED Final   Carbapenem resistance KPC NOT DETECTED NOT DETECTED Final   Carbapenem resistance NDM NOT DETECTED NOT DETECTED Final   Carbapenem resist OXA 48 LIKE NOT DETECTED NOT DETECTED Final   Carbapenem resistance VIM NOT DETECTED NOT DETECTED Final    Comment: Performed at [facility], [address] [place], [place] [address]  Blood Culture (routine x 2)     Status: Abnormal   Collection Time: 12/15/23  4:30 PM   Specimen: BLOOD  Result Value Ref Range Status   Specimen Description   Final    BLOOD RIGHT ANTECUBITAL Performed at [facility], [address] [place], [place] [address]    Special Requests   Final    BOTTLES DRAWN AEROBIC AND ANAEROBIC Blood Culture results may not be optimal due to an inadequate volume of blood received in culture bottles Performed at [facility], [address] [place], [place] [address]    Culture  Setup Time   Final    GRAM NEGATIVE RODS IN BOTH AEROBIC AND ANAEROBIC BOTTLES CRITICAL VALUE NOTED.  VALUE IS CONSISTENT WITH PREVIOUSLY REPORTED AND CALLED VALUE. Performed at [facility], [address] [place], [place] [address]    Culture (A)  Final    ESCHERICHIA COLI SUSCEPTIBILITIES PERFORMED ON PREVIOUS CULTURE WITHIN THE LAST 5 DAYS. Performed at [facility], [address], [place], [place] [address]    Report Status 12/18/2023 FINAL  Final  Urine Culture (for pregnant, neutropenic or urologic patients  or patients with an indwelling urinary catheter)     Status: None   Collection Time: 12/15/23  5:40 PM   Specimen: Urine, Clean Catch  Result Value Ref Range Status   Specimen Description   Final    URINE, CLEAN CATCH Performed at [facility], [address] [place], [place] [address]    Special Requests   Final    NONE Performed at [facility], [address], [place], [place] [address]    Culture   Final    NO GROWTH Performed at [facility], [address], [place], [place] [address]    Report Status 12/16/2023 FINAL  Final  MRSA Next Gen by PCR, Nasal     Status: None   Collection Time: 12/16/23  5:28 PM   Specimen: Nasal Mucosa; Nasal Swab  Result Value Ref Range Status   MRSA by PCR Next Gen NOT DETECTED NOT DETECTED Final    Comment: (NOTE) The GeneXpert MRSA Assay (FDA approved for NASAL specimens only), is one component of a comprehensive MRSA colonization surveillance program. It is not intended to diagnose MRSA infection nor to guide or monitor treatment for MRSA infections. Test performance is not FDA approved in patients less than 52 years old. Performed at [facility], [address] [place], [place] [address]     Coagulation Studies: No results for input(s): "LABPROT", "INR" in the last 72 hours.   Urinalysis: No results for input(s): "COLORURINE", "LABSPEC", "PHURINE", "GLUCOSEU", "HGBUR", "BILIRUBINUR", "KETONESUR", "PROTEINUR", "UROBILINOGEN", "NITRITE", "LEUKOCYTESUR" in the last 72 hours.  Invalid input(s): "APPERANCEUR"     Imaging: No results found.    Medications:    sodium chloride  5 mL/hr at 12/20/23 0608   cefTRIAXone  (ROCEPHIN )  IV 2 g (12/20/23 0609)    aspirin   81 mg Per Tube Daily   Chlorhexidine  Gluconate Cloth  6 each Topical Q0600   cloBAZam   5 mg Per Tube Q12H   diclofenac   100 mg Rectal Once   feeding supplement  237 mL Oral BID BM   heparin   5,000 Units Subcutaneous Q8H    lacosamide   200 mg Per Tube BID   levETIRAcetam   500 mg Per Tube BID   metroNIDAZOLE   500 mg Per Tube Q12H   mupirocin  ointment  1 Application Topical BID   nicotine   21 mg Transdermal Daily   omeprazole   20 mg Oral Daily   sertraline   25 mg Oral Daily   sevelamer  carbonate  800 mg Per Tube TID WC   sodium chloride , hydrALAZINE , hydrOXYzine , ibuprofen , LORazepam , ondansetron  (ZOFRAN ) IV, oxyCODONE , senna-docusate  Assessment/ Plan:  Sherry [name] is a 67 y.o.  female with past medical conditions including seizures, chronic biliary duct dilation, CHF, hypertension, stroke, diastolic heart failure, depression anxiety, bipolar, anemia, breast cancer status post right mastectomy, polypharmacy, end-stage renal disease on hemodialysis, who was admitted to [facility] on 12/15/2023 for  Elevated LFTs [R79.89] Liver lesion [K76.9] Dilation of biliary tract [K83.8] Sepsis (HCC) [A41.9] Urinary tract infection without hematuria, site unspecified [N39.0] Altered mental status, unspecified altered mental status type [R41.82] Sepsis, due to unspecified organism, unspecified whether acute organ dysfunction present (HCC) [A41.9]   End stage renal disease on hemodialysis. Next treatment scheduled for Tuesday  2. Sepsis secondary to UTI and sacral wound infection. Blood culture positive for E coli and Enterobacterales. Prescribed Vancomycin , Ceftriaxone  and metronidazole .   3. Chronic biliary dilation with suspected cholangitis and liver abscess. CT abdomen and pelvis with contrast shows diffuse biliary duct dilatation with hypodense nodular areas in the liver concerning for intrahepatic abscesses versus metastatic disease. ERCP completed on  6/6 with biliary sphincterotomy.   4. Anemia of chronic kidney disease Lab Results  Component Value Date   HGB 8.7 (L) 12/20/2023    Hgb has improved with blood transfusion. Due to breast cancer history, will avoid ESA's.   5. Secondary Hyperparathyroidism: with  outpatient labs: PTH 354 on 09/01/23.    Lab Results  Component Value Date   CALCIUM  8.8 (L) 12/20/2023   CAION 1.13 (L) 05/25/2023   PHOS 6.2 (H) 05/26/2023    Patient prescribed auryxia and sevelamer  outpatient. Will continue to monitor bone minerals during this admission   6. Hypertension with chronic kidney disease. Home regimen includes carvedilol , and hydralazine . Held at this time. Blood pressure stable   LOS: 5 Aubri Gathright 6/9/202512:59 PM

## 2023-12-20 NOTE — Progress Notes (Signed)
 Progress Note    Sherry [name]  ZOX:[id number] DOB: Jul 02, 1956  DOA: 12/15/2023 PCP: Tita Form, MD      Brief Narrative:    Medical records reviewed and are as summarized below:  Sherry [name] is a 68 y.o. female with medical history significant for ESRD on HD (on MWF schedule), seizure, chronic biliary duct dilation in the setting of a cholecystectomy which is felt to be physiological and normal per GI consult (this is documented by Dr. Gordon Latus on 02/24/22), chronic hepatitis C, opioid use disorder, stroke, s/p of gastrostomy, hypertension, diastolic CHF, stroke, depression with anxiety, bipolar, anemia,HCV, breast cancer (s/p right mastectomy), polypharmacy.  She was previously hospitalized at [facility] in February 2025 for E. coli pneumonia and bacteremia, cerebellar stroke and tonic-clonic seizures in the setting of severe PRES that required intubation and subsequent tracheostomy and PEG tube placement.  She presented to the hospital because of fever and altered mental status.  Reportedly, she was noted to be confused and she spiked a temperature with fever of 102.4 F on the day of admission.   ED Course: pt was found to have WBC 18.2, lactic acid of 1.4 --> 1.1, potassium 3.6, positive UA (turbid appearance, large amount of leukocyte, many bacteria, WBC > 50, squamous cell 11-20), negative PCR for COVID, flu and RSV, ammonia level<13, troponin 25 --> 22, abnormal liver function (ALP 1035, AST 214, ALT 67, total bilirubin 0.9). Blood pressure 150/72 --> 119/58, heart rate 95, RR 21, oxygen saturation 94% on room air.  Chest x-ray negative.  CT of head negative.  Patient is admitted to PCU as inpatient.  Dr. Zelda Hickman of renal is consulted.   CT of abdomen/pelvis/chest: 1. Diffuse biliary duct dilatation with several small hypodense nodular areas in the liver concerning for intrahepatic abscesses versus less likely metastatic disease. Further evaluation with MRI/MRCP is recommended. 2.  Bibasilar subpleural atelectasis. Pneumonia is less likely but not excluded. 3. 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 4. Constipation with possible fecal impaction in the rectal vault. No bowel obstruction. Normal appendix. 5.  Aortic Atherosclerosis (ICD10-I70.0).   MRCP:    IMPRESSION: 1. Exam detail is diminished due to motion artifact. 2. Within the right lobe of liver there are multifocal areas of mural enhancing T2 hyperintense foci which are favored to represent dilated biliary radicles. The less dilated more central bile ducts within the right lobe have irregular appearance with multiple focal areas of narrowing giving it a string of pearls like appearance. In the acute setting imaging findings are concerning for cholangitis. Differential considerations include multiple peripheral liver abscesses. Underlying malignancy is considered less favored. Follow-up imaging advised to ensure resolution following appropriate therapy. 3. Status post cholecystectomy. Common bile duct dilatation measures up to 2.1 cm. Abrupt focal narrowing of the distal 7 mm of the CBD is favored to represent underlying stricture possibly postinflammatory or secondary to previously passed stone. Debris is noted within the CBD with a few small foci of signal void artifact measuring approximately 3 mm which may reflect small stones versus signal void artifact. 4. Trace bilateral pleural effusions with overlying subpleural consolidation noted in both lung bases. 5. Bilateral renal cortical atrophy. 6. Small subcutaneous nodule within the right paramidline ventral abdominal wall is favored to represent a small hematoma. Possibly due to injection site.    She was admitted to the hospital for severe sepsis from E. coli bacteremia, probable acute UTI and suspected acute cholangitis.  Assessment/Plan:   Principal Problem:   Severe sepsis (HCC) Active Problems:    Liver lesion   Abnormal liver function   UTI (urinary tract infection)   Acute metabolic encephalopathy   ESRD on dialysis [facility])   Essential hypertension   Chronic diastolic CHF (congestive heart failure) (HCC)   Stroke (HCC)   Seizure (HCC)   Cigarette smoker   Hx of bipolar disorder   Sacral wound   E coli bacteremia   Cholangitis   Calculus of bile duct without cholecystitis with obstruction    Body mass index is 22.9 kg/m.   Severe sepsis, E. coli bacteremia, probable acute UTI, probable acute cholangitis, leukocytosis:  4 out of 4 blood cultures positive for E. coli.   No growth on urine culture Sacral wound culture showed multiple bacteria (Klebsiella pneumoniae, E. coli, Bacteroides fragilis, Enterococcus faecalis and Corynebacterium striatum). Continue IV ceftriaxone . Follow-up with ID specialist for further recommendations   Chronic biliary dilatation from previous cholecystectomy, suspected acute cholangitis, suspected multiple peripheral liver abscesses on MRCP, chronically elevated liver enzymes, chronic hepatitis C:  S/p ERCP on 12/2023.  "A filling defect was seen on the cholangiogram.  Multiple biliary strictures were found in the entire biliary tree.  Strictures were inflammatory.  Choledocholithiasis was found.  Complete removal was accomplished by biliary sphincterotomy and balloon extraction.  A biliary sphincterectomy was performed.  The biliary tree was swept and nothing was found.  1 plastic stent was placed into the common bile duct".   Acute metabolic encephalopathy: Improved.  She is back to baseline.  Likely due to infectious process.  CT head did not show any acute stroke.   Hypokalemia: Repleted with oral potassium and monitor levels.   Acute on chronic anemia: H&H stable.  Hemoglobin up from 6.7-7.9-8.2-8.7. S/p transfusion of 1 unit of PRBCs on 12/16/2023.   Monitor H&H and transfuse as needed.   ESRD on TTS schedule: Follow-up with  nephrologist.  Chronic diastolic CHF: Compensated   Seizure disorder: Continue clobazam , Vimpat  and Keppra .   Sacral decubitus ulcer (stage III versus stage IV): Present on admission.  According to [facility], patient's niece, wound was contaminated with fecal material about a week prior to admission. She was evaluated by the surgeon today.  Wound VAC has been applied due to sacral wound.  Follow-up with general surgeon and wound care nurse.   History of stroke, dysphagia with gastrostomy tube in place Patient is on dysphagia 1 diet (confirmed by [place], niece).     Diet Order             DIET - DYS 1 Room service appropriate? Yes; Fluid consistency: Thin  Diet effective now                            Consultants: ID specialist Nephrologist Gastroenterologist  Procedures: ERCP 12/17/2023    Medications:    aspirin   81 mg Per Tube Daily   Chlorhexidine  Gluconate Cloth  6 each Topical Q0600   cloBAZam   5 mg Per Tube Q12H   diclofenac   100 mg Rectal Once   feeding supplement  237 mL Oral BID BM   heparin   5,000 Units Subcutaneous Q8H   lacosamide   200 mg Per Tube BID   levETIRAcetam   500 mg Per Tube BID   metroNIDAZOLE   500 mg Per Tube Q12H   mupirocin  ointment  1 Application Topical BID   nicotine   21 mg Transdermal Daily   omeprazole   20 mg Oral Daily   sertraline   25 mg Oral Daily   sevelamer  carbonate  800 mg Per Tube TID WC   Continuous Infusions:  sodium chloride  5 mL/hr at 12/20/23 0608   cefTRIAXone  (ROCEPHIN )  IV 2 g (12/20/23 0609)     Anti-infectives (From admission, onward)    Start     Dose/Rate Route Frequency Ordered Stop   12/19/23 2200  metroNIDAZOLE  (FLAGYL ) tablet 500 mg        500 mg Per Tube Every 12 hours 12/19/23 0815     12/16/23 2200  ceFEPIme  (MAXIPIME ) 1 g in sodium chloride  0.9 % 100 mL IVPB  Status:  Discontinued        1 g 200 mL/hr over 30 Minutes Intravenous Every 24 hours 12/15/23 2037 12/16/23 0549   12/16/23 1800   metroNIDAZOLE  (FLAGYL ) IVPB 500 mg        500 mg 100 mL/hr over 60 Minutes Intravenous Every 12 hours 12/16/23 1511 12/19/23 2159   12/16/23 1200  vancomycin  (VANCOREADY) IVPB 750 mg/150 mL  Status:  Discontinued        750 mg 150 mL/hr over 60 Minutes Intravenous Every T-Th-Sa (Hemodialysis) 12/15/23 2312 12/16/23 0549   12/16/23 0600  metroNIDAZOLE  (FLAGYL ) IVPB 500 mg  Status:  Discontinued        500 mg 100 mL/hr over 60 Minutes Intravenous Every 12 hours 12/15/23 2035 12/16/23 0549   12/16/23 0600  cefTRIAXone  (ROCEPHIN ) 2 g in sodium chloride  0.9 % 100 mL IVPB        2 g 200 mL/hr over 30 Minutes Intravenous Every 24 hours 12/16/23 0553     12/15/23 2245  vancomycin  (VANCOREADY) IVPB 500 mg/100 mL        500 mg 100 mL/hr over 60 Minutes Intravenous  Once 12/15/23 2236 12/16/23 0532   12/15/23 1600  ceFEPIme  (MAXIPIME ) 2 g in sodium chloride  0.9 % 100 mL IVPB        2 g 200 mL/hr over 30 Minutes Intravenous  Once 12/15/23 1548 12/15/23 1819   12/15/23 1600  metroNIDAZOLE  (FLAGYL ) IVPB 500 mg        500 mg 100 mL/hr over 60 Minutes Intravenous  Once 12/15/23 1548 12/15/23 1955   12/15/23 1600  vancomycin  (VANCOCIN ) IVPB 1000 mg/200 mL premix        1,000 mg 200 mL/hr over 60 Minutes Intravenous  Once 12/15/23 1548 12/15/23 2128              Family Communication/Anticipated D/C date and plan/Code Status   DVT prophylaxis: heparin  injection 5,000 Units Start: 12/15/23 2200     Code Status: Full Code  Family Communication: None  Disposition Plan: Plan to discharge to SNF   Status is: Inpatient Remains inpatient appropriate because: Sepsis, E. coli bacteremia       Subjective:   Interval events noted.  She has no complaints.  She said the surgeon saw her this morning but she does not remember much because she was sleepy.    Objective:    Vitals:   12/19/23 1620 12/19/23 2100 12/20/23 0616 12/20/23 0800  BP: 121/65 (!) 144/70 (!) 152/74 (!) 152/78   Pulse: 81 76 72 75  Resp: 17 16 15 16   Temp: 98.6 F (37 C) 97.7 F (36.5 C) 98.8 F (37.1 C) 98 F (36.7 C)  TempSrc:      SpO2: 100% 100% 100% 100%  Weight:   56.8 kg   Height:  No data found.   Intake/Output Summary (Last 24 hours) at 12/20/2023 1313 Last data filed at 12/20/2023 1049 Gross per 24 hour  Intake 60 ml  Output --  Net 60 ml    Filed Weights   12/18/23 1208 12/19/23 0500 12/20/23 0616  Weight: 56 kg 57 kg 56.8 kg    Exam:   GEN: NAD SKIN: Warm and dry.  Open sacral wound. EYES: No pallor or icterus  ENT: MMM CV: RRR PULM: CTA B ABD: soft, ND, NT, +BS, gastrostomy tube in place CNS: AAO x 3, non focal EXT: No edema or tenderness        Data Reviewed:   I have personally reviewed following labs and imaging studies:  Labs: Labs show the following:   Basic Metabolic Panel: Recent Labs  Lab 12/15/23 1819 12/16/23 0532 12/17/23 0610 12/18/23 0442 12/20/23 0114  NA 142 139 138 139 137  K 3.6 3.6 3.5 3.7 2.9*  CL 103 102 100 99 95*  CO2 26 27 27 27 28   GLUCOSE 139* 110* 83 92 86  BUN 45* 53* 31* 51* 41*  CREATININE 3.61* 4.13* 2.53* 3.38* 2.92*  CALCIUM  8.8* 9.0 8.7* 8.8* 8.8*   GFR Estimated Creatinine Clearance: 14.6 mL/min (A) (by C-G formula based on SCr of 2.92 mg/dL (H)). Liver Function Tests: Recent Labs  Lab 12/15/23 1819 12/16/23 0532 12/17/23 0610 12/18/23 0442 12/20/23 0114  AST 214* 184* 137* 128* 91*  ALT 67* 61* 44 38 28  ALKPHOS 1,035* 974* 829* 775* 714*  BILITOT 0.9 1.4* 1.3* 0.9 0.8  PROT 6.9 6.9 6.5 6.7 7.1  ALBUMIN 2.1* 2.1* 2.0* 2.0* 2.2*   No results for input(s): "LIPASE", "AMYLASE" in the last 168 hours. Recent Labs  Lab 12/15/23 1819  AMMONIA <13   Coagulation profile Recent Labs  Lab 12/15/23 1820  INR 1.3*    CBC: Recent Labs  Lab 12/15/23 1819 12/16/23 0532 12/17/23 0610 12/18/23 0442 12/20/23 0114  WBC 18.2* 21.3* 21.0* 16.2* 13.2*  NEUTROABS 15.8*  --   --  14.0* 10.9*   HGB 7.7* 6.7* 7.9* 8.2* 8.7*  HCT 24.8* 22.3* 24.8* 25.5* 28.5*  MCV 92.5 93.7 89.9 88.9 90.5  PLT 414* 415* 438* 407* 439*   Cardiac Enzymes: Recent Labs  Lab 12/15/23 1819  CKTOTAL 11*   BNP (last 3 results) No results for input(s): "PROBNP" in the last 8760 hours. CBG: Recent Labs  Lab 12/16/23 0917  GLUCAP 111*   D-Dimer: No results for input(s): "DDIMER" in the last 72 hours. Hgb A1c: No results for input(s): "HGBA1C" in the last 72 hours. Lipid Profile: No results for input(s): "CHOL", "HDL", "LDLCALC", "TRIG", "CHOLHDL", "LDLDIRECT" in the last 72 hours. Thyroid function studies: No results for input(s): "TSH", "T4TOTAL", "T3FREE", "THYROIDAB" in the last 72 hours.  Invalid input(s): "FREET3" Anemia work up: No results for input(s): "VITAMINB12", "FOLATE", "FERRITIN", "TIBC", "IRON", "RETICCTPCT" in the last 72 hours.  Sepsis Labs: Recent Labs  Lab 12/15/23 1630 12/15/23 1819 12/15/23 1819 12/16/23 0532 12/17/23 0610 12/18/23 0442 12/20/23 0114  WBC  --  18.2*   < > 21.3* 21.0* 16.2* 13.2*  LATICACIDVEN 1.4 1.1  --   --   --   --   --    < > = values in this interval not displayed.    Microbiology Recent Results (from the past 240 hours)  Resp panel by RT-PCR (RSV, Flu A&B, Covid) Anterior Nasal Swab     Status: None   Collection Time:  12/15/23  4:11 PM   Specimen: Anterior Nasal Swab  Result Value Ref Range Status   SARS Coronavirus 2 by RT PCR NEGATIVE NEGATIVE Final    Comment: (NOTE) SARS-CoV-2 target nucleic acids are NOT DETECTED.  The SARS-CoV-2 RNA is generally detectable in upper respiratory specimens during the acute phase of infection. The lowest concentration of SARS-CoV-2 viral copies this assay can detect is 138 copies/mL. A negative result does not preclude SARS-Cov-2 infection and should not be used as the sole basis for treatment or other patient management decisions. A negative result may occur with  improper specimen  collection/handling, submission of specimen other than nasopharyngeal swab, presence of viral mutation(s) within the areas targeted by this assay, and inadequate number of viral copies(<138 copies/mL). A negative result must be combined with clinical observations, patient history, and epidemiological information. The expected result is Negative.  Fact Sheet for Patients:  BloggerCourse.com  Fact Sheet for Healthcare Providers:  SeriousBroker.it  This test is no t yet approved or cleared by the [place]  FDA and  has been authorized for detection and/or diagnosis of SARS-CoV-2 by FDA under an Emergency Use Authorization (EUA). This EUA will remain  in effect (meaning this test can be used) for the duration of the COVID-19 declaration under Section 564(b)(1) of the Act, 21 U.S.C.section 360bbb-3(b)(1), unless the authorization is terminated  or revoked sooner.       Influenza A by PCR NEGATIVE NEGATIVE Final   Influenza B by PCR NEGATIVE NEGATIVE Final    Comment: (NOTE) The Xpert Xpress SARS-CoV-2/FLU/RSV plus assay is intended as an aid in the diagnosis of influenza from Nasopharyngeal swab specimens and should not be used as a sole basis for treatment. Nasal washings and aspirates are unacceptable for Xpert Xpress SARS-CoV-2/FLU/RSV testing.  Fact Sheet for Patients: BloggerCourse.com  Fact Sheet for Healthcare Providers: SeriousBroker.it  This test is not yet approved or cleared by the [place]  FDA and has been authorized for detection and/or diagnosis of SARS-CoV-2 by FDA under an Emergency Use Authorization (EUA). This EUA will remain in effect (meaning this test can be used) for the duration of the COVID-19 declaration under Section 564(b)(1) of the Act, 21 U.S.C. section 360bbb-3(b)(1), unless the authorization is terminated or revoked.     Resp Syncytial  Virus by PCR NEGATIVE NEGATIVE Final    Comment: (NOTE) Fact Sheet for Patients: BloggerCourse.com  Fact Sheet for Healthcare Providers: SeriousBroker.it  This test is not yet approved or cleared by the [place]  FDA and has been authorized for detection and/or diagnosis of SARS-CoV-2 by FDA under an Emergency Use Authorization (EUA). This EUA will remain in effect (meaning this test can be used) for the duration of the COVID-19 declaration under Section 564(b)(1) of the Act, 21 U.S.C. section 360bbb-3(b)(1), unless the authorization is terminated or revoked.  Performed at [facility], [address], [place], [place] [address]   Blood Culture (routine x 2)     Status: Abnormal   Collection Time: 12/15/23  4:12 PM   Specimen: BLOOD  Result Value Ref Range Status   Specimen Description   Final    BLOOD RIGHT ANTECUBITAL Performed at [facility], [address] [place], [place] [address]    Special Requests   Final    BOTTLES DRAWN AEROBIC AND ANAEROBIC Blood Culture results may not be optimal due to an inadequate volume of blood received in culture bottles Performed at [facility], [address], [place], [place] [address]  Culture  Setup Time   Final    GRAM NEGATIVE RODS IN BOTH AEROBIC AND ANAEROBIC BOTTLES Organism ID to follow CRITICAL RESULT CALLED TO, READ BACK BY AND VERIFIED WITH:  JASON ROBINS AT 1610 12/16/23 JG Performed at [facility], [address] [place], [place] [address]    Culture ESCHERICHIA COLI (A)  Final   Report Status 12/18/2023 FINAL  Final   Organism ID, Bacteria ESCHERICHIA COLI  Final   Organism ID, Bacteria ESCHERICHIA COLI  Final      Susceptibility   Escherichia coli - KIRBY BAUER*    CEFAZOLIN  RESISTANT Resistant    Escherichia coli - MIC*    AMPICILLIN >=32 RESISTANT Resistant     CEFEPIME  <=0.12 SENSITIVE Sensitive     CEFTAZIDIME <=1  SENSITIVE Sensitive     CEFTRIAXONE  <=0.25 SENSITIVE Sensitive     CIPROFLOXACIN >=4 RESISTANT Resistant     GENTAMICIN <=1 SENSITIVE Sensitive     IMIPENEM <=0.25 SENSITIVE Sensitive     TRIMETH /SULFA  <=20 SENSITIVE Sensitive     AMPICILLIN/SULBACTAM >=32 RESISTANT Resistant     PIP/TAZO <=4 SENSITIVE Sensitive ug/mL    * ESCHERICHIA COLI    ESCHERICHIA COLI  Aerobic/Anaerobic Culture w Gram Stain (surgical/deep wound)     Status: None   Collection Time: 12/15/23  4:12 PM   Specimen: Wound  Result Value Ref Range Status   Specimen Description   Final    WOUND Performed at [facility], [address] [place], [place] [address]    Special Requests   Final    SACRAL Performed at [facility], [address] [place], [place] [address]    Gram Stain NO WBC SEEN RARE GRAM NEGATIVE RODS   Final   Culture   Final    ABUNDANT KLEBSIELLA PNEUMONIAE FEW ESCHERICHIA COLI FEW BACTEROIDES FRAGILIS BETA LACTAMASE POSITIVE FEW ENTEROCOCCUS FAECALIS FEW CORYNEBACTERIUM STRIATUM Standardized susceptibility testing for this organism is not available. Performed at [facility], [address] [place], [place] [address]    Report Status 12/19/2023 FINAL  Final   Organism ID, Bacteria KLEBSIELLA PNEUMONIAE  Final   Organism ID, Bacteria ESCHERICHIA COLI  Final   Organism ID, Bacteria ENTEROCOCCUS FAECALIS  Final      Susceptibility   Escherichia coli - MIC*    AMPICILLIN >=32 RESISTANT Resistant     CEFEPIME  <=0.12 SENSITIVE Sensitive     CEFTAZIDIME <=1 SENSITIVE Sensitive     CEFTRIAXONE  <=0.25 SENSITIVE Sensitive     CIPROFLOXACIN >=4 RESISTANT Resistant     GENTAMICIN <=1 SENSITIVE Sensitive     IMIPENEM <=0.25 SENSITIVE Sensitive     TRIMETH /SULFA  <=20 SENSITIVE Sensitive     AMPICILLIN/SULBACTAM >=32 RESISTANT Resistant     PIP/TAZO <=4 SENSITIVE Sensitive ug/mL    * FEW ESCHERICHIA COLI   Enterococcus faecalis - MIC*    AMPICILLIN <=2 SENSITIVE  Sensitive     VANCOMYCIN  >=32 RESISTANT Resistant     GENTAMICIN SYNERGY SENSITIVE Sensitive     * FEW ENTEROCOCCUS FAECALIS   Klebsiella pneumoniae - MIC*    AMPICILLIN RESISTANT Resistant     CEFEPIME  <=0.12 SENSITIVE Sensitive     CEFTAZIDIME <=1 SENSITIVE Sensitive     CEFTRIAXONE  <=0.25 SENSITIVE Sensitive     CIPROFLOXACIN <=0.25 SENSITIVE Sensitive     GENTAMICIN <=1 SENSITIVE Sensitive     IMIPENEM <=0.25 SENSITIVE Sensitive     TRIMETH /SULFA  <=20 SENSITIVE Sensitive     AMPICILLIN/SULBACTAM <=2 SENSITIVE Sensitive  PIP/TAZO <=4 SENSITIVE Sensitive ug/mL    * ABUNDANT KLEBSIELLA PNEUMONIAE  Blood Culture ID Panel (Reflexed)     Status: Abnormal   Collection Time: 12/15/23  4:12 PM  Result Value Ref Range Status   Enterococcus faecalis NOT DETECTED NOT DETECTED Final   Enterococcus Faecium NOT DETECTED NOT DETECTED Final   Listeria monocytogenes NOT DETECTED NOT DETECTED Final   Staphylococcus species NOT DETECTED NOT DETECTED Final   Staphylococcus aureus (BCID) NOT DETECTED NOT DETECTED Final   Staphylococcus epidermidis NOT DETECTED NOT DETECTED Final   Staphylococcus lugdunensis NOT DETECTED NOT DETECTED Final   Streptococcus species NOT DETECTED NOT DETECTED Final   Streptococcus agalactiae NOT DETECTED NOT DETECTED Final   Streptococcus pneumoniae NOT DETECTED NOT DETECTED Final   Streptococcus pyogenes NOT DETECTED NOT DETECTED Final   A.calcoaceticus-baumannii NOT DETECTED NOT DETECTED Final   Bacteroides fragilis NOT DETECTED NOT DETECTED Final   Enterobacterales DETECTED (A) NOT DETECTED Final    Comment: Enterobacterales represent a large order of gram negative bacteria, not a single organism. CRITICAL RESULT CALLED TO, READ BACK BY AND VERIFIED WITH:  JASON ROBINS AT 8119 12/16/23 JG    Enterobacter cloacae complex NOT DETECTED NOT DETECTED Final   Escherichia coli DETECTED (A) NOT DETECTED Final    Comment: CRITICAL RESULT CALLED TO, READ BACK BY AND  VERIFIED WITH:  JASON ROBINS AT 0529 12/16/23 JG    Klebsiella aerogenes NOT DETECTED NOT DETECTED Final   Klebsiella oxytoca NOT DETECTED NOT DETECTED Final   Klebsiella pneumoniae NOT DETECTED NOT DETECTED Final   Proteus species NOT DETECTED NOT DETECTED Final   Salmonella species NOT DETECTED NOT DETECTED Final   Serratia marcescens NOT DETECTED NOT DETECTED Final   Haemophilus influenzae NOT DETECTED NOT DETECTED Final   Neisseria meningitidis NOT DETECTED NOT DETECTED Final   Pseudomonas aeruginosa NOT DETECTED NOT DETECTED Final   Stenotrophomonas maltophilia NOT DETECTED NOT DETECTED Final   Candida albicans NOT DETECTED NOT DETECTED Final   Candida auris NOT DETECTED NOT DETECTED Final   Candida glabrata NOT DETECTED NOT DETECTED Final   Candida krusei NOT DETECTED NOT DETECTED Final   Candida parapsilosis NOT DETECTED NOT DETECTED Final   Candida tropicalis NOT DETECTED NOT DETECTED Final   Cryptococcus neoformans/gattii NOT DETECTED NOT DETECTED Final   CTX-M ESBL NOT DETECTED NOT DETECTED Final   Carbapenem resistance IMP NOT DETECTED NOT DETECTED Final   Carbapenem resistance KPC NOT DETECTED NOT DETECTED Final   Carbapenem resistance NDM NOT DETECTED NOT DETECTED Final   Carbapenem resist OXA 48 LIKE NOT DETECTED NOT DETECTED Final   Carbapenem resistance VIM NOT DETECTED NOT DETECTED Final    Comment: Performed at [facility], [address] [place], [place] [address]  Blood Culture (routine x 2)     Status: Abnormal   Collection Time: 12/15/23  4:30 PM   Specimen: BLOOD  Result Value Ref Range Status   Specimen Description   Final    BLOOD RIGHT ANTECUBITAL Performed at [facility], [address] [place], [place] [address]    Special Requests   Final    BOTTLES DRAWN AEROBIC AND ANAEROBIC Blood Culture results may not be optimal due to an inadequate volume of blood received in culture bottles Performed at [facility], [address] [place], [place] [address]    Culture  Setup Time   Final    GRAM NEGATIVE RODS IN BOTH AEROBIC AND ANAEROBIC BOTTLES CRITICAL VALUE NOTED.  VALUE IS  CONSISTENT WITH PREVIOUSLY REPORTED AND CALLED VALUE. Performed at [facility], [address] [place], [place] [address]    Culture (A)  Final    ESCHERICHIA COLI SUSCEPTIBILITIES PERFORMED ON PREVIOUS CULTURE WITHIN THE LAST 5 DAYS. Performed at [facility], [address] [place], [place] [address]    Report Status 12/18/2023 FINAL  Final  Urine Culture (for pregnant, neutropenic or urologic patients or patients with an indwelling urinary catheter)     Status: None   Collection Time: 12/15/23  5:40 PM   Specimen: Urine, Clean Catch  Result Value Ref Range Status   Specimen Description   Final    URINE, CLEAN CATCH Performed at [facility], [address], [place], [place] [address]    Special Requests   Final    NONE Performed at [facility], [address] [place], [place] [address]    Culture   Final    NO GROWTH Performed at [facility], [address] [place], [place] [address]    Report Status 12/16/2023 FINAL  Final  MRSA Next Gen by PCR, Nasal     Status: None   Collection Time: 12/16/23  5:28 PM   Specimen: Nasal Mucosa; Nasal Swab  Result Value Ref Range Status   MRSA by PCR Next Gen NOT DETECTED NOT DETECTED Final    Comment: (NOTE) The GeneXpert MRSA Assay (FDA approved for NASAL specimens only), is one component of a comprehensive MRSA colonization surveillance program. It is not intended to diagnose MRSA infection nor to guide or monitor treatment for MRSA infections. Test performance is not FDA approved in patients less than 72 years old. Performed at [facility], [address] [place], [place] [address]     Procedures and diagnostic studies:  No results found.              LOS: 5 days   Jarrell Armond  [facility]    Pager on [email or web address]. If 7PM-7AM, please contact night-coverage at [email or web address]     12/20/2023, 1:13 PM

## 2023-12-20 NOTE — Progress Notes (Signed)
 INFECTIOUS DISEASE PROGRESS NOTE Date of Admission:  12/15/2023     ID: Sherry [name] is a 68 y.o. female with  bacteremia, cholagitis  Principal Problem:   Severe sepsis (HCC) Active Problems:   Hx of bipolar disorder   Essential hypertension   Cigarette smoker   Chronic diastolic CHF (congestive heart failure) (HCC)   Stroke (HCC)   ESRD on dialysis (HCC)   UTI (urinary tract infection)   Liver lesion   Acute metabolic encephalopathy   Sacral wound   Abnormal liver function   Seizure (HCC)   E coli bacteremia   Cholangitis   Calculus of bile duct without cholecystitis with obstruction   Subjective: NO fevers, wbc down to 13 Day 6 of abx.  ROS  Eleven systems are reviewed and negative except per hpi  Medications:  Antibiotics Given (last 72 hours)     Date/Time Action Medication Dose Rate   12/17/23 2129 New Bag/Given   metroNIDAZOLE  (FLAGYL ) IVPB 500 mg 500 mg 100 mL/hr   12/18/23 0624 Started During Downtime   cefTRIAXone  (ROCEPHIN ) 2 g in sodium chloride  0.9 % 100 mL IVPB 2 g 200 mL/hr   12/18/23 1312 New Bag/Given   metroNIDAZOLE  (FLAGYL ) IVPB 500 mg 500 mg 100 mL/hr   12/18/23 2302 New Bag/Given   metroNIDAZOLE  (FLAGYL ) IVPB 500 mg 500 mg 100 mL/hr   12/19/23 1610 New Bag/Given   cefTRIAXone  (ROCEPHIN ) 2 g in sodium chloride  0.9 % 100 mL IVPB 2 g 200 mL/hr   12/19/23 0913 New Bag/Given   metroNIDAZOLE  (FLAGYL ) IVPB 500 mg 500 mg 100 mL/hr   12/19/23 2104 Given   metroNIDAZOLE  (FLAGYL ) tablet 500 mg 500 mg    12/20/23 9604 New Bag/Given   cefTRIAXone  (ROCEPHIN ) 2 g in sodium chloride  0.9 % 100 mL IVPB 2 g 200 mL/hr   12/20/23 0947 Given   metroNIDAZOLE  (FLAGYL ) tablet 500 mg 500 mg        aspirin   81 mg Per Tube Daily   Chlorhexidine  Gluconate Cloth  6 each Topical Q0600   cloBAZam   5 mg Per Tube Q12H   diclofenac   100 mg Rectal Once   feeding supplement  237 mL Oral BID BM   heparin   5,000 Units Subcutaneous Q8H   lacosamide   200 mg Per Tube BID    levETIRAcetam   500 mg Per Tube BID   metroNIDAZOLE   500 mg Per Tube Q12H   mupirocin  ointment  1 Application Topical BID   nicotine   21 mg Transdermal Daily   omeprazole   20 mg Oral Daily   sertraline   25 mg Oral Daily   sevelamer  carbonate  800 mg Per Tube TID WC    Objective: Vital signs in last 24 hours: Temp:  [97.7 F (36.5 C)-98.8 F (37.1 C)] 98 F (36.7 C) (06/09 0800) Pulse Rate:  [72-81] 75 (06/09 0800) Resp:  [15-17] 16 (06/09 0800) BP: (121-152)/(65-78) 152/78 (06/09 0800) SpO2:  [100 %] 100 % (06/09 0800) Weight:  [56.8 kg] 56.8 kg (06/09 0616) Constitutional: chroniclaly ill appearing, lying in bed eating HENT: [place]/AT, PERRLA, no scleral icterus Mouth/Throat: Oropharynx is dry Cardiovascular: Normal rate, regular rhythm Pulmonary/Chest: Effort normal and breath sounds normal. No respiratory distress.  has no wheezes.  Neck = supple, no nuchal rigidity Abdominal: Soft. Mild distention Lymphadenopathy: no cervical adenopathy. No axillary adenopathy Neurological: alert but slowed mentation  HD cath R upper chest Skin: sacreal decub as below  Lab Results Recent Labs    12/18/23 0442 12/20/23 0114  WBC 16.2* 13.2*  HGB 8.2* 8.7*  HCT 25.5* 28.5*  NA 139 137  K 3.7 2.9*  CL 99 95*  CO2 27 28  BUN 51* 41*  CREATININE 3.38* 2.92*    Microbiology:  Specimen Description --    WOUND Performed at [facility], [address], [place], [place] [address]   Special Requests --   SACRAL Performed at [facility], [address] [place], [place] [address]   Gram Stain --   NO WBC SEEN RARE GRAM NEGATIVE RODS   Culture --   ABUNDANT KLEBSIELLA PNEUMONIAE FEW ESCHERICHIA COLI FEW BACTEROIDES FRAGILIS BETA LACTAMASE POSITIVE FEW ENTEROCOCCUS FAECALIS FEW CORYNEBACTERIUM STRIATUM Standardized susceptibility testing for this organism is not available. Performed at [facility], [address] [place], [place] [address]   Report  Status 12/19/2023 FINAL   Organism ID, Bacteria KLEBSIELLA PNEUMONIAE   Organism ID, Bacteria ESCHERICHIA COLI   Organism ID, Bacteria ENTEROCOCCUS FAECALIS   Results for orders placed or performed during the hospital encounter of 12/15/23  Resp panel by RT-PCR (RSV, Flu A&B, Covid) Anterior Nasal Swab     Status: None   Collection Time: 12/15/23  4:11 PM   Specimen: Anterior Nasal Swab  Result Value Ref Range Status   SARS Coronavirus 2 by RT PCR NEGATIVE NEGATIVE Final    Comment: (NOTE) SARS-CoV-2 target nucleic acids are NOT DETECTED.  The SARS-CoV-2 RNA is generally detectable in upper respiratory specimens during the acute phase of infection. The lowest concentration of SARS-CoV-2 viral copies this assay can detect is 138 copies/mL. A negative result does not preclude SARS-Cov-2 infection and should not be used as the sole basis for treatment or other patient management decisions. A negative result may occur with  improper specimen collection/handling, submission of specimen other than nasopharyngeal swab, presence of viral mutation(s) within the areas targeted by this assay, and inadequate number of viral copies(<138 copies/mL). A negative result must be combined with clinical observations, patient history, and epidemiological information. The expected result is Negative.  Fact Sheet for Patients:  BloggerCourse.com  Fact Sheet for Healthcare Providers:  SeriousBroker.it  This test is no t yet approved or cleared by the [place]  FDA and  has been authorized for detection and/or diagnosis of SARS-CoV-2 by FDA under an Emergency Use Authorization (EUA). This EUA will remain  in effect (meaning this test can be used) for the duration of the COVID-19 declaration under Section 564(b)(1) of the Act, 21 U.S.C.section 360bbb-3(b)(1), unless the authorization is terminated  or revoked sooner.       Influenza A by PCR  NEGATIVE NEGATIVE Final   Influenza B by PCR NEGATIVE NEGATIVE Final    Comment: (NOTE) The Xpert Xpress SARS-CoV-2/FLU/RSV plus assay is intended as an aid in the diagnosis of influenza from Nasopharyngeal swab specimens and should not be used as a sole basis for treatment. Nasal washings and aspirates are unacceptable for Xpert Xpress SARS-CoV-2/FLU/RSV testing.  Fact Sheet for Patients: BloggerCourse.com  Fact Sheet for Healthcare Providers: SeriousBroker.it  This test is not yet approved or cleared by the [place]  FDA and has been authorized for detection and/or diagnosis of SARS-CoV-2 by FDA under an Emergency Use Authorization (EUA). This EUA will remain in effect (meaning this test can be used) for the duration of the COVID-19 declaration under Section 564(b)(1) of the Act, 21 U.S.C. section 360bbb-3(b)(1), unless the authorization is terminated or revoked.     Resp Syncytial Virus by PCR NEGATIVE NEGATIVE  Final    Comment: (NOTE) Fact Sheet for Patients: BloggerCourse.com  Fact Sheet for Healthcare Providers: SeriousBroker.it  This test is not yet approved or cleared by the [place]  FDA and has been authorized for detection and/or diagnosis of SARS-CoV-2 by FDA under an Emergency Use Authorization (EUA). This EUA will remain in effect (meaning this test can be used) for the duration of the COVID-19 declaration under Section 564(b)(1) of the Act, 21 U.S.C. section 360bbb-3(b)(1), unless the authorization is terminated or revoked.  Performed at [facility], [address], [place], [place] [address]   Blood Culture (routine x 2)     Status: Abnormal   Collection Time: 12/15/23  4:12 PM   Specimen: BLOOD  Result Value Ref Range Status   Specimen Description   Final    BLOOD RIGHT ANTECUBITAL Performed at [facility], [address] [place], [place] [address]    Special Requests   Final    BOTTLES DRAWN AEROBIC AND ANAEROBIC Blood Culture results may not be optimal due to an inadequate volume of blood received in culture bottles Performed at [facility], [address] [place], [place] [address]    Culture  Setup Time   Final    GRAM NEGATIVE RODS IN BOTH AEROBIC AND ANAEROBIC BOTTLES Organism ID to follow CRITICAL RESULT CALLED TO, READ BACK BY AND VERIFIED WITH:  JASON ROBINS AT 3244 12/16/23 JG Performed at [facility], [address] [place], [place] [address]    Culture ESCHERICHIA COLI (A)  Final   Report Status 12/18/2023 FINAL  Final   Organism ID, Bacteria ESCHERICHIA COLI  Final   Organism ID, Bacteria ESCHERICHIA COLI  Final      Susceptibility   Escherichia coli - KIRBY BAUER*    CEFAZOLIN  RESISTANT Resistant    Escherichia coli - MIC*    AMPICILLIN >=32 RESISTANT Resistant     CEFEPIME  <=0.12 SENSITIVE Sensitive     CEFTAZIDIME <=1 SENSITIVE Sensitive     CEFTRIAXONE  <=0.25 SENSITIVE Sensitive     CIPROFLOXACIN >=4 RESISTANT Resistant     GENTAMICIN <=1 SENSITIVE Sensitive     IMIPENEM <=0.25 SENSITIVE Sensitive     TRIMETH /SULFA  <=20 SENSITIVE Sensitive     AMPICILLIN/SULBACTAM >=32 RESISTANT Resistant     PIP/TAZO <=4 SENSITIVE Sensitive ug/mL    * ESCHERICHIA COLI    ESCHERICHIA COLI  Aerobic/Anaerobic Culture w Gram Stain (surgical/deep wound)     Status: None   Collection Time: 12/15/23  4:12 PM   Specimen: Wound  Result Value Ref Range Status   Specimen Description   Final    WOUND Performed at [facility], [address] [place], [place] [address]    Special Requests   Final    SACRAL Performed at [facility], [address] [place], [place] [address]    Gram Stain NO WBC SEEN RARE GRAM NEGATIVE RODS   Final   Culture   Final    ABUNDANT KLEBSIELLA PNEUMONIAE FEW ESCHERICHIA COLI FEW BACTEROIDES FRAGILIS BETA LACTAMASE  POSITIVE FEW ENTEROCOCCUS FAECALIS FEW CORYNEBACTERIUM STRIATUM Standardized susceptibility testing for this organism is not available. Performed at [facility], [address], [place], [place] [address]    Report Status 12/19/2023 FINAL  Final   Organism ID, Bacteria KLEBSIELLA PNEUMONIAE  Final   Organism ID, Bacteria ESCHERICHIA COLI  Final   Organism ID, Bacteria ENTEROCOCCUS FAECALIS  Final      Susceptibility   Escherichia coli -  MIC*    AMPICILLIN >=32 RESISTANT Resistant     CEFEPIME  <=0.12 SENSITIVE Sensitive     CEFTAZIDIME <=1 SENSITIVE Sensitive     CEFTRIAXONE  <=0.25 SENSITIVE Sensitive     CIPROFLOXACIN >=4 RESISTANT Resistant     GENTAMICIN <=1 SENSITIVE Sensitive     IMIPENEM <=0.25 SENSITIVE Sensitive     TRIMETH /SULFA  <=20 SENSITIVE Sensitive     AMPICILLIN/SULBACTAM >=32 RESISTANT Resistant     PIP/TAZO <=4 SENSITIVE Sensitive ug/mL    * FEW ESCHERICHIA COLI   Enterococcus faecalis - MIC*    AMPICILLIN <=2 SENSITIVE Sensitive     VANCOMYCIN  >=32 RESISTANT Resistant     GENTAMICIN SYNERGY SENSITIVE Sensitive     * FEW ENTEROCOCCUS FAECALIS   Klebsiella pneumoniae - MIC*    AMPICILLIN RESISTANT Resistant     CEFEPIME  <=0.12 SENSITIVE Sensitive     CEFTAZIDIME <=1 SENSITIVE Sensitive     CEFTRIAXONE  <=0.25 SENSITIVE Sensitive     CIPROFLOXACIN <=0.25 SENSITIVE Sensitive     GENTAMICIN <=1 SENSITIVE Sensitive     IMIPENEM <=0.25 SENSITIVE Sensitive     TRIMETH /SULFA  <=20 SENSITIVE Sensitive     AMPICILLIN/SULBACTAM <=2 SENSITIVE Sensitive     PIP/TAZO <=4 SENSITIVE Sensitive ug/mL    * ABUNDANT KLEBSIELLA PNEUMONIAE  Blood Culture ID Panel (Reflexed)     Status: Abnormal   Collection Time: 12/15/23  4:12 PM  Result Value Ref Range Status   Enterococcus faecalis NOT DETECTED NOT DETECTED Final   Enterococcus Faecium NOT DETECTED NOT DETECTED Final   Listeria monocytogenes NOT DETECTED NOT DETECTED Final   Staphylococcus species NOT DETECTED NOT  DETECTED Final   Staphylococcus aureus (BCID) NOT DETECTED NOT DETECTED Final   Staphylococcus epidermidis NOT DETECTED NOT DETECTED Final   Staphylococcus lugdunensis NOT DETECTED NOT DETECTED Final   Streptococcus species NOT DETECTED NOT DETECTED Final   Streptococcus agalactiae NOT DETECTED NOT DETECTED Final   Streptococcus pneumoniae NOT DETECTED NOT DETECTED Final   Streptococcus pyogenes NOT DETECTED NOT DETECTED Final   A.calcoaceticus-baumannii NOT DETECTED NOT DETECTED Final   Bacteroides fragilis NOT DETECTED NOT DETECTED Final   Enterobacterales DETECTED (A) NOT DETECTED Final    Comment: Enterobacterales represent a large order of gram negative bacteria, not a single organism. CRITICAL RESULT CALLED TO, READ BACK BY AND VERIFIED WITH:  JASON ROBINS AT 0529 12/16/23 JG    Enterobacter cloacae complex NOT DETECTED NOT DETECTED Final   Escherichia coli DETECTED (A) NOT DETECTED Final    Comment: CRITICAL RESULT CALLED TO, READ BACK BY AND VERIFIED WITH:  JASON ROBINS AT 0529 12/16/23 JG    Klebsiella aerogenes NOT DETECTED NOT DETECTED Final   Klebsiella oxytoca NOT DETECTED NOT DETECTED Final   Klebsiella pneumoniae NOT DETECTED NOT DETECTED Final   Proteus species NOT DETECTED NOT DETECTED Final   Salmonella species NOT DETECTED NOT DETECTED Final   Serratia marcescens NOT DETECTED NOT DETECTED Final   Haemophilus influenzae NOT DETECTED NOT DETECTED Final   Neisseria meningitidis NOT DETECTED NOT DETECTED Final   Pseudomonas aeruginosa NOT DETECTED NOT DETECTED Final   Stenotrophomonas maltophilia NOT DETECTED NOT DETECTED Final   Candida albicans NOT DETECTED NOT DETECTED Final   Candida auris NOT DETECTED NOT DETECTED Final   Candida glabrata NOT DETECTED NOT DETECTED Final   Candida krusei NOT DETECTED NOT DETECTED Final   Candida parapsilosis NOT DETECTED NOT DETECTED Final   Candida tropicalis NOT DETECTED NOT DETECTED Final   Cryptococcus neoformans/gattii NOT  DETECTED NOT DETECTED Final  CTX-M ESBL NOT DETECTED NOT DETECTED Final   Carbapenem resistance IMP NOT DETECTED NOT DETECTED Final   Carbapenem resistance KPC NOT DETECTED NOT DETECTED Final   Carbapenem resistance NDM NOT DETECTED NOT DETECTED Final   Carbapenem resist OXA 48 LIKE NOT DETECTED NOT DETECTED Final   Carbapenem resistance VIM NOT DETECTED NOT DETECTED Final    Comment: Performed at [facility], [address], [place], [place] [address]  Blood Culture (routine x 2)     Status: Abnormal   Collection Time: 12/15/23  4:30 PM   Specimen: BLOOD  Result Value Ref Range Status   Specimen Description   Final    BLOOD RIGHT ANTECUBITAL Performed at [facility], [address], [place], [place] [address]    Special Requests   Final    BOTTLES DRAWN AEROBIC AND ANAEROBIC Blood Culture results may not be optimal due to an inadequate volume of blood received in culture bottles Performed at [facility], [address] [place], [place] [address]    Culture  Setup Time   Final    GRAM NEGATIVE RODS IN BOTH AEROBIC AND ANAEROBIC BOTTLES CRITICAL VALUE NOTED.  VALUE IS CONSISTENT WITH PREVIOUSLY REPORTED AND CALLED VALUE. Performed at [facility], [address] [place], [place] [address]    Culture (A)  Final    ESCHERICHIA COLI SUSCEPTIBILITIES PERFORMED ON PREVIOUS CULTURE WITHIN THE LAST 5 DAYS. Performed at [facility], [address] [place], [place] [address]    Report Status 12/18/2023 FINAL  Final  Urine Culture (for pregnant, neutropenic or urologic patients or patients with an indwelling urinary catheter)     Status: None   Collection Time: 12/15/23  5:40 PM   Specimen: Urine, Clean Catch  Result Value Ref Range Status   Specimen Description   Final    URINE, CLEAN CATCH Performed at [facility], [address] [place], [place] [address]    Special Requests   Final    NONE Performed at [facility], [address] [place], [place] [address]    Culture   Final    NO GROWTH Performed at [facility], [address] [place], [place] [address]    Report Status 12/16/2023 FINAL  Final  MRSA Next Gen by PCR, Nasal     Status: None   Collection Time: 12/16/23  5:28 PM   Specimen: Nasal Mucosa; Nasal Swab  Result Value Ref Range Status   MRSA by PCR Next Gen NOT DETECTED NOT DETECTED Final    Comment: (NOTE) The GeneXpert MRSA Assay (FDA approved for NASAL specimens only), is one component of a comprehensive MRSA colonization surveillance program. It is not intended to diagnose MRSA infection nor to guide or monitor treatment for MRSA infections. Test performance is not FDA approved in patients less than 26 years old. Performed at [facility], [address] [place], [place] [address]     Studies/Results: No results found.   Assessment/Plan: Sherry [name] is a 68 y.o. 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 Also has decub ulcer which has been present for several months. She has been ill for some time and was at [facility] prior to being dced to SNF  6/6- s/p ERCP, wbc 21 6/9 - wound cx mixed. Seen by surgery and plannig wound vac  Recommendations Cont ceftriaxone  while inpatient. Can consider ceftazidime at  HD (1,1,2 gm q HD) if dced prior to finishing abx course.  DC flagyl   Plan 14 day abx course for E coli bacteremia, source from bilary tract and possible liver abscesses.  Cont wound care per surgery and wound care team Thank you very much for the consult. Will follow with you.  Eartha Gold   12/20/2023, 1:38 PM

## 2023-12-20 NOTE — Progress Notes (Signed)
 Initial Nutrition Assessment  DOCUMENTATION CODES:   Non-severe (moderate) malnutrition in context of chronic illness  INTERVENTION:   -Feeding assistance with meals -D/c Ensure Plus High Protein po TID, each supplement provides 350 kcal and 20 grams of protein -Magic cup TID with meals, each supplement provides 290 kcal and 9 grams of protein  -Renal MVI daily daily -5000 mg vitamin C BID -200 mg zinc  sulfate daily x 14 days -RD will check for micronutrient deficiencies which may impede wound healing: vitamin A -Nepro Shake po TID, each supplement provides 425 kcal and 19 grams protein   NUTRITION DIAGNOSIS:   Moderate Malnutrition related to chronic illness (ESRD on HD) as evidenced by mild fat depletion, moderate fat depletion, mild muscle depletion, moderate muscle depletion, percent weight loss.  GOAL:   Patient will meet greater than or equal to 90% of their needs  MONITOR:   PO intake, Supplement acceptance, Diet advancement  REASON FOR ASSESSMENT:   Malnutrition Screening Tool    ASSESSMENT:   Pt with medical history significant of ESRD on HD (TTS), seizure, chronic biliary duct dilation in the setting of a cholecystectomy which is felt to be physiological and normal per GI consult (this is documented by Dr. Gordon Latus on 02/24/22), stroke, s/p of gastrostomy, hypertension, diastolic CHF, stroke, depression with anxiety, bipolar, anemia,HCV, breast cancer (s/p right mastectomy), polypharmacy, who presents with AMS and fever.  Pt admitted with sepsis.   6/6- s/p ERCP- revealed filling defect on the cholangiogram, multiple biliary strictures found (inflammatory), choledocholithiasis found (removed via biliary sphincterotomy and balloon extraction), stent placed in bile duct 6/9- wound vac placed  Reviewed I/O's: +658 ml x 24 hours and +1.5 L since admission  Per [facility] notes, pt with stage 3 pressure injury to sacrum. Wound vac placed by [facility] today.   Pt is a resident  of [place]. Pt was on a dysphagia 1 diet with thin liquids. Pt has a g-tube, but consumes all nutrition orally. She was on a dysphagia 1 diet with thin liquids PTA.   Pt sleeping soundly at time of visit. She did not arouse to voice or touch. No family present. Per MD notes, pt refused breakfast this AM. Noted meal completions 0%. Pt also refusing Ensure supplements.   Reviewed wt hx; pt has experienced a 21.1% wt loss over the past 5 months, which is not significant for time frame. Unsure of EDW.   Medications reviewed and include vimpat , keppra , and renvela .   Labs reviewed: K: 2.9, Phos: 6.2, CBGS: 111 (inpatient orders for glycemic control are none).    NUTRITION - FOCUSED PHYSICAL EXAM:  Flowsheet Row Most Recent Value  Orbital Region Moderate depletion  Upper Arm Region Mild depletion  Thoracic and Lumbar Region No depletion  Buccal Region No depletion  Temple Region Moderate depletion  Clavicle Bone Region No depletion  Clavicle and Acromion Bone Region No depletion  Scapular Bone Region No depletion  Dorsal Hand Moderate depletion  Patellar Region Moderate depletion  Anterior Thigh Region Moderate depletion  Posterior Calf Region Moderate depletion  Edema (RD Assessment) None  Hair Reviewed  Eyes Reviewed  Mouth Reviewed  Skin Reviewed  Nails Reviewed       Diet Order:   Diet Order             DIET - DYS 1 Room service appropriate? Yes; Fluid consistency: Thin  Diet effective now  EDUCATION NEEDS:   No education needs have been identified at this time  Skin:  Skin Assessment: Skin Integrity Issues: Skin Integrity Issues:: Wound VAC Wound Vac: stage 3 sacrum  Last BM:  12/17/23  Height:   Ht Readings from Last 1 Encounters:  12/15/23 5\' 2"  (1.575 m)    Weight:   Wt Readings from Last 1 Encounters:  12/20/23 56.8 kg    Ideal Body Weight:  50 kg  BMI:  Body mass index is 22.9 kg/m.  Estimated Nutritional Needs:    Kcal:  1550-1750  Protein:  85-100 grams  Fluid:  1000 ml + UOP    Herschel Lords, RD, LDN, CDCES Registered Dietitian III Certified Diabetes Care and Education Specialist If unable to reach this RD, please use "RD Inpatient" group chat on secure chat between hours of 8am-4 pm daily

## 2023-12-20 NOTE — Plan of Care (Signed)

## 2023-12-21 DIAGNOSIS — A419 Sepsis, unspecified organism: Secondary | ICD-10-CM | POA: Diagnosis not present

## 2023-12-21 DIAGNOSIS — R6520 Severe sepsis without septic shock: Secondary | ICD-10-CM | POA: Diagnosis not present

## 2023-12-21 LAB — BASIC METABOLIC PANEL WITH GFR
Anion gap: 14 (ref 5–15)
BUN: 53 mg/dL — ABNORMAL HIGH (ref 8–23)
CO2: 26 mmol/L (ref 22–32)
Calcium: 8.8 mg/dL — ABNORMAL LOW (ref 8.9–10.3)
Chloride: 96 mmol/L — ABNORMAL LOW (ref 98–111)
Creatinine, Ser: 3.76 mg/dL — ABNORMAL HIGH (ref 0.44–1.00)
GFR, Estimated: 13 mL/min — ABNORMAL LOW (ref >60)
Glucose, Bld: 93 mg/dL (ref 70–99)
Potassium: 4.1 mmol/L (ref 3.5–5.1)
Sodium: 136 mmol/L (ref 135–145)

## 2023-12-21 LAB — HEPATIC FUNCTION PANEL
ALT: 21 U/L (ref 0–44)
AST: 75 U/L — ABNORMAL HIGH (ref 15–41)
Albumin: 2.2 g/dL — ABNORMAL LOW (ref 3.5–5.0)
Alkaline Phosphatase: 717 U/L — ABNORMAL HIGH (ref 38–126)
Bilirubin, Direct: 0.2 mg/dL (ref 0.0–0.2)
Indirect Bilirubin: 0.6 mg/dL (ref 0.3–0.9)
Total Bilirubin: 0.8 mg/dL (ref 0.0–1.2)
Total Protein: 6.8 g/dL (ref 6.5–8.1)

## 2023-12-21 LAB — CBC
HCT: 30.2 % — ABNORMAL LOW (ref 36.0–46.0)
Hemoglobin: 9.4 g/dL — ABNORMAL LOW (ref 12.0–15.0)
MCH: 27.7 pg (ref 26.0–34.0)
MCHC: 31.1 g/dL (ref 30.0–36.0)
MCV: 89.1 fL (ref 80.0–100.0)
Platelets: 436 10*3/uL — ABNORMAL HIGH (ref 150–400)
RBC: 3.39 MIL/uL — ABNORMAL LOW (ref 3.87–5.11)
RDW: 16.4 % — ABNORMAL HIGH (ref 11.5–15.5)
WBC: 13 10*3/uL — ABNORMAL HIGH (ref 4.0–10.5)
nRBC: 0 % (ref 0.0–0.2)

## 2023-12-21 MED ORDER — ZINC SULFATE 220 (50 ZN) MG PO CAPS
220.0000 mg | ORAL_CAPSULE | Freq: Every day | ORAL | Status: DC
  Administered 2023-12-21 – 2023-12-24 (×4): 220 mg
  Filled 2023-12-21 (×4): qty 1

## 2023-12-21 MED ORDER — HEPARIN SODIUM (PORCINE) 1000 UNIT/ML IJ SOLN
INTRAMUSCULAR | Status: AC
  Filled 2023-12-21: qty 10

## 2023-12-21 NOTE — Progress Notes (Signed)
   12/21/23 1737  Vitals  Temp 98.1 F (36.7 C)  Pulse Rate 73  Resp 16  BP (!) 146/64  SpO2 100 %  O2 Device Room Air  Oxygen Therapy  Patient Activity (if Appropriate) In bed  Pulse Oximetry Type Continuous  Oximetry Probe Site Changed No  Post Treatment  Dialyzer Clearance Lightly streaked  Liters Processed 84  Fluid Removed (mL) 1000 mL  Tolerated HD Treatment Yes   Received patient in bed to unit.  Alert and oriented.  Informed consent signed and in chart.   TX duration: Three hours and thirty minutes  Patient tolerated well.  Transported back to the room  Alert, without acute distress.  Hand-off given to patient's nurse.   Access used: Right HD CVC catheter Access issues: None

## 2023-12-21 NOTE — Progress Notes (Addendum)
 Progress Note    Sherry [name]  WUJ:[id number] DOB: October 13, 1955  DOA: 12/15/2023 PCP: Tita Form, MD      Brief Narrative:    Medical records reviewed and are as summarized below:  Sherry [name] is a 68 y.o. female with medical history significant for ESRD on HD (on MWF schedule), seizure, chronic biliary duct dilation in the setting of a cholecystectomy which is felt to be physiological and normal per GI consult (this is documented by Dr. Gordon Latus on 02/24/22), chronic hepatitis C, opioid use disorder, stroke, s/p of gastrostomy, hypertension, diastolic CHF, stroke, depression with anxiety, bipolar, anemia,HCV, breast cancer (s/p right mastectomy), polypharmacy.  She was previously hospitalized at [facility] in February 2025 for E. coli pneumonia and bacteremia, cerebellar stroke and tonic-clonic seizures in the setting of severe PRES that required intubation and subsequent tracheostomy and PEG tube placement.  She presented to the hospital because of fever and altered mental status.  Reportedly, she was noted to be confused and she spiked a temperature with fever of 102.4 F on the day of admission.   ED Course: pt was found to have WBC 18.2, lactic acid of 1.4 --> 1.1, potassium 3.6, positive UA (turbid appearance, large amount of leukocyte, many bacteria, WBC > 50, squamous cell 11-20), negative PCR for COVID, flu and RSV, ammonia level<13, troponin 25 --> 22, abnormal liver function (ALP 1035, AST 214, ALT 67, total bilirubin 0.9). Blood pressure 150/72 --> 119/58, heart rate 95, RR 21, oxygen saturation 94% on room air.  Chest x-ray negative.  CT of head negative.  Patient is admitted to PCU as inpatient.  Dr. Zelda Hickman of renal is consulted.   CT of abdomen/pelvis/chest: 1. Diffuse biliary duct dilatation with several small hypodense nodular areas in the liver concerning for intrahepatic abscesses versus less likely metastatic disease. Further evaluation with MRI/MRCP is recommended. 2.  Bibasilar subpleural atelectasis. Pneumonia is less likely but not excluded. 3. 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 4. Constipation with possible fecal impaction in the rectal vault. No bowel obstruction. Normal appendix. 5.  Aortic Atherosclerosis (ICD10-I70.0).   MRCP:    IMPRESSION: 1. Exam detail is diminished due to motion artifact. 2. Within the right lobe of liver there are multifocal areas of mural enhancing T2 hyperintense foci which are favored to represent dilated biliary radicles. The less dilated more central bile ducts within the right lobe have irregular appearance with multiple focal areas of narrowing giving it a string of pearls like appearance. In the acute setting imaging findings are concerning for cholangitis. Differential considerations include multiple peripheral liver abscesses. Underlying malignancy is considered less favored. Follow-up imaging advised to ensure resolution following appropriate therapy. 3. Status post cholecystectomy. Common bile duct dilatation measures up to 2.1 cm. Abrupt focal narrowing of the distal 7 mm of the CBD is favored to represent underlying stricture possibly postinflammatory or secondary to previously passed stone. Debris is noted within the CBD with a few small foci of signal void artifact measuring approximately 3 mm which may reflect small stones versus signal void artifact. 4. Trace bilateral pleural effusions with overlying subpleural consolidation noted in both lung bases. 5. Bilateral renal cortical atrophy. 6. Small subcutaneous nodule within the right paramidline ventral abdominal wall is favored to represent a small hematoma. Possibly due to injection site.    She was admitted to the hospital for severe sepsis from E. coli bacteremia, probable acute UTI and suspected acute cholangitis.  Assessment/Plan:   Principal Problem:   Severe sepsis (HCC) Active Problems:    Liver lesion   Abnormal liver function   UTI (urinary tract infection)   Acute metabolic encephalopathy   ESRD on dialysis [facility])   Essential hypertension   Chronic diastolic CHF (congestive heart failure) (HCC)   Stroke (HCC)   Seizure (HCC)   Cigarette smoker   Hx of bipolar disorder   Sacral wound   E coli bacteremia   Cholangitis   Calculus of bile duct without cholecystitis with obstruction   Malnutrition of moderate degree    Body mass index is 20.67 kg/m.   Severe sepsis, E. coli bacteremia, probable acute UTI, probable acute cholangitis, leukocytosis:  4 out of 4 blood cultures positive for E. coli.   No growth on urine culture Sacral wound culture showed multiple bacteria (Klebsiella pneumoniae, E. coli, Bacteroides fragilis, Enterococcus faecalis and Corynebacterium striatum). ID specialist recommended total of 14 days of IV antibiotics. He recommended continuing IV ceftriaxone  while inpatient and possibly transitioning to ceftazidime at hemodialysis.   Chronic biliary dilatation from previous cholecystectomy, suspected acute cholangitis, suspected multiple peripheral liver abscesses on MRCP, chronically elevated liver enzymes, chronic hepatitis C:  S/p ERCP on 12/2023.  "A filling defect was seen on the cholangiogram.  Multiple biliary strictures were found in the entire biliary tree.  Strictures were inflammatory.  Choledocholithiasis was found.  Complete removal was accomplished by biliary sphincterotomy and balloon extraction.  A biliary sphincterectomy was performed.  The biliary tree was swept and nothing was found.  1 plastic stent was placed into the common bile duct". Liver enzymes are improving.   Acute metabolic encephalopathy: Improved.  She is back to baseline.  Likely due to infectious process.  CT head did not show any acute stroke.   Hypokalemia: Improved   Acute on chronic anemia: H&H stable.  Hemoglobin up from 6.7-7.9-8.2-8.7-9.4. S/p transfusion  of 1 unit of PRBCs on 12/16/2023.   Monitor H&H and transfuse as needed.   ESRD on TTS schedule: Follow-up with nephrologist.  Chronic diastolic CHF: Compensated   Seizure disorder: Continue clobazam , Vimpat  and Keppra .   Sacral decubitus ulcer (stage III versus stage IV): Present on admission.  According to [facility], patient's niece, wound was contaminated with fecal material about a week prior to admission. She was evaluated by the surgeon today.  Wound VAC has been applied to sacral wound.  Follow-up with general surgeon and wound care nurse.   History of stroke, dysphagia with gastrostomy tube in place Patient is on dysphagia 1 diet (confirmed by [place], niece).     Diet Order             DIET - DYS 1 Room service appropriate? Yes; Fluid consistency: Thin  Diet effective now                            Consultants: ID specialist Nephrologist Gastroenterologist  Procedures: ERCP 12/17/2023    Medications:    vitamin C  500 mg Per Tube BID   aspirin   81 mg Per Tube Daily   Chlorhexidine  Gluconate Cloth  6 each Topical Q0600   cloBAZam   5 mg Per Tube Q12H   feeding supplement  237 mL Oral BID BM   heparin   5,000 Units Subcutaneous Q8H   lacosamide   200 mg Per Tube BID   levETIRAcetam   500 mg Per Tube BID   multivitamin  1 tablet Per Tube QHS  mupirocin  ointment  1 Application Topical BID   nicotine   21 mg Transdermal Daily   omeprazole   20 mg Per Tube Daily   sertraline   25 mg Per Tube Daily   sevelamer  carbonate  800 mg Per Tube TID WC   zinc  sulfate (50mg  elemental zinc )  220 mg Per Tube Daily   Continuous Infusions:  cefTRIAXone  (ROCEPHIN )  IV Stopped (12/21/23 0721)     Anti-infectives (From admission, onward)    Start     Dose/Rate Route Frequency Ordered Stop   12/19/23 2200  metroNIDAZOLE  (FLAGYL ) tablet 500 mg  Status:  Discontinued        500 mg Per Tube Every 12 hours 12/19/23 0815 12/20/23 1534   12/16/23 2200  ceFEPIme  (MAXIPIME ) 1  g in sodium chloride  0.9 % 100 mL IVPB  Status:  Discontinued        1 g 200 mL/hr over 30 Minutes Intravenous Every 24 hours 12/15/23 2037 12/16/23 0549   12/16/23 1800  metroNIDAZOLE  (FLAGYL ) IVPB 500 mg        500 mg 100 mL/hr over 60 Minutes Intravenous Every 12 hours 12/16/23 1511 12/19/23 2159   12/16/23 1200  vancomycin  (VANCOREADY) IVPB 750 mg/150 mL  Status:  Discontinued        750 mg 150 mL/hr over 60 Minutes Intravenous Every T-Th-Sa (Hemodialysis) 12/15/23 2312 12/16/23 0549   12/16/23 0600  metroNIDAZOLE  (FLAGYL ) IVPB 500 mg  Status:  Discontinued        500 mg 100 mL/hr over 60 Minutes Intravenous Every 12 hours 12/15/23 2035 12/16/23 0549   12/16/23 0600  cefTRIAXone  (ROCEPHIN ) 2 g in sodium chloride  0.9 % 100 mL IVPB        2 g 200 mL/hr over 30 Minutes Intravenous Every 24 hours 12/16/23 0553     12/15/23 2245  vancomycin  (VANCOREADY) IVPB 500 mg/100 mL        500 mg 100 mL/hr over 60 Minutes Intravenous  Once 12/15/23 2236 12/16/23 0532   12/15/23 1600  ceFEPIme  (MAXIPIME ) 2 g in sodium chloride  0.9 % 100 mL IVPB        2 g 200 mL/hr over 30 Minutes Intravenous  Once 12/15/23 1548 12/15/23 1819   12/15/23 1600  metroNIDAZOLE  (FLAGYL ) IVPB 500 mg        500 mg 100 mL/hr over 60 Minutes Intravenous  Once 12/15/23 1548 12/15/23 1955   12/15/23 1600  vancomycin  (VANCOCIN ) IVPB 1000 mg/200 mL premix        1,000 mg 200 mL/hr over 60 Minutes Intravenous  Once 12/15/23 1548 12/15/23 2128              Family Communication/Anticipated D/C date and plan/Code Status   DVT prophylaxis: heparin  injection 5,000 Units Start: 12/15/23 2200     Code Status: Full Code  Family Communication: None  Disposition Plan: Plan to discharge to SNF   Status is: Inpatient Remains inpatient appropriate because: Sepsis, E. coli bacteremia       Subjective:   Interval events noted.  No complaints.  Objective:    Vitals:   12/20/23 2027 12/21/23 0500 12/21/23 0523  12/21/23 0729  BP: 136/67  (!) 145/69 (!) 145/63  Pulse: 72  73 73  Resp: 15  16 14   Temp: 98.6 F (37 C)  98.4 F (36.9 C) 98.2 F (36.8 C)  TempSrc:      SpO2: 100%  100% 100%  Weight:  51.3 kg    Height:  No data found.   Intake/Output Summary (Last 24 hours) at 12/21/2023 1222 Last data filed at 12/21/2023 0721 Gross per 24 hour  Intake 303.31 ml  Output 25 ml  Net 278.31 ml    Filed Weights   12/19/23 0500 12/20/23 0616 12/21/23 0500  Weight: 57 kg 56.8 kg 51.3 kg    Exam:  GEN: NAD SKIN: Warm and dry.  Wound VAC to sacral wound. EYES: No pallor or icterus   ENT: MMM CV: RRR PULM: CTA B ABD: soft, ND, NT, +BS, G-tube in place CNS: AAO x 2 (person and place), non focal EXT: No edema or tenderness      Data Reviewed:   I have personally reviewed following labs and imaging studies:  Labs: Labs show the following:   Basic Metabolic Panel: Recent Labs  Lab 12/16/23 0532 12/17/23 0610 12/18/23 0442 12/20/23 0114 12/21/23 0800  NA 139 138 139 137 136  K 3.6 3.5 3.7 2.9* 4.1  CL 102 100 99 95* 96*  CO2 27 27 27 28 26   GLUCOSE 110* 83 92 86 93  BUN 53* 31* 51* 41* 53*  CREATININE 4.13* 2.53* 3.38* 2.92* 3.76*  CALCIUM  9.0 8.7* 8.8* 8.8* 8.8*   GFR Estimated Creatinine Clearance: 11.3 mL/min (A) (by C-G formula based on SCr of 3.76 mg/dL (H)). Liver Function Tests: Recent Labs  Lab 12/15/23 1819 12/16/23 0532 12/17/23 0610 12/18/23 0442 12/20/23 0114  AST 214* 184* 137* 128* 91*  ALT 67* 61* 44 38 28  ALKPHOS 1,035* 974* 829* 775* 714*  BILITOT 0.9 1.4* 1.3* 0.9 0.8  PROT 6.9 6.9 6.5 6.7 7.1  ALBUMIN 2.1* 2.1* 2.0* 2.0* 2.2*   No results for input(s): "LIPASE", "AMYLASE" in the last 168 hours. Recent Labs  Lab 12/15/23 1819  AMMONIA <13   Coagulation profile Recent Labs  Lab 12/15/23 1820  INR 1.3*    CBC: Recent Labs  Lab 12/15/23 1819 12/16/23 0532 12/17/23 0610 12/18/23 0442 12/20/23 0114 12/21/23 0800  WBC  18.2* 21.3* 21.0* 16.2* 13.2* 13.0*  NEUTROABS 15.8*  --   --  14.0* 10.9*  --   HGB 7.7* 6.7* 7.9* 8.2* 8.7* 9.4*  HCT 24.8* 22.3* 24.8* 25.5* 28.5* 30.2*  MCV 92.5 93.7 89.9 88.9 90.5 89.1  PLT 414* 415* 438* 407* 439* 436*   Cardiac Enzymes: Recent Labs  Lab 12/15/23 1819  CKTOTAL 11*   BNP (last 3 results) No results for input(s): "PROBNP" in the last 8760 hours. CBG: Recent Labs  Lab 12/16/23 0917  GLUCAP 111*   D-Dimer: No results for input(s): "DDIMER" in the last 72 hours. Hgb A1c: No results for input(s): "HGBA1C" in the last 72 hours. Lipid Profile: No results for input(s): "CHOL", "HDL", "LDLCALC", "TRIG", "CHOLHDL", "LDLDIRECT" in the last 72 hours. Thyroid function studies: No results for input(s): "TSH", "T4TOTAL", "T3FREE", "THYROIDAB" in the last 72 hours.  Invalid input(s): "FREET3" Anemia work up: No results for input(s): "VITAMINB12", "FOLATE", "FERRITIN", "TIBC", "IRON", "RETICCTPCT" in the last 72 hours.  Sepsis Labs: Recent Labs  Lab 12/15/23 1630 12/15/23 1819 12/16/23 0532 12/17/23 0610 12/18/23 0442 12/20/23 0114 12/21/23 0800  WBC  --  18.2*   < > 21.0* 16.2* 13.2* 13.0*  LATICACIDVEN 1.4 1.1  --   --   --   --   --    < > = values in this interval not displayed.    Microbiology Recent Results (from the past 240 hours)  Resp panel by RT-PCR (RSV, Flu A&B, Covid)  Anterior Nasal Swab     Status: None   Collection Time: 12/15/23  4:11 PM   Specimen: Anterior Nasal Swab  Result Value Ref Range Status   SARS Coronavirus 2 by RT PCR NEGATIVE NEGATIVE Final    Comment: (NOTE) SARS-CoV-2 target nucleic acids are NOT DETECTED.  The SARS-CoV-2 RNA is generally detectable in upper respiratory specimens during the acute phase of infection. The lowest concentration of SARS-CoV-2 viral copies this assay can detect is 138 copies/mL. A negative result does not preclude SARS-Cov-2 infection and should not be used as the sole basis for treatment  or other patient management decisions. A negative result may occur with  improper specimen collection/handling, submission of specimen other than nasopharyngeal swab, presence of viral mutation(s) within the areas targeted by this assay, and inadequate number of viral copies(<138 copies/mL). A negative result must be combined with clinical observations, patient history, and epidemiological information. The expected result is Negative.  Fact Sheet for Patients:  BloggerCourse.com  Fact Sheet for Healthcare Providers:  SeriousBroker.it  This test is no t yet approved or cleared by the [place]  FDA and  has been authorized for detection and/or diagnosis of SARS-CoV-2 by FDA under an Emergency Use Authorization (EUA). This EUA will remain  in effect (meaning this test can be used) for the duration of the COVID-19 declaration under Section 564(b)(1) of the Act, 21 U.S.C.section 360bbb-3(b)(1), unless the authorization is terminated  or revoked sooner.       Influenza A by PCR NEGATIVE NEGATIVE Final   Influenza B by PCR NEGATIVE NEGATIVE Final    Comment: (NOTE) The Xpert Xpress SARS-CoV-2/FLU/RSV plus assay is intended as an aid in the diagnosis of influenza from Nasopharyngeal swab specimens and should not be used as a sole basis for treatment. Nasal washings and aspirates are unacceptable for Xpert Xpress SARS-CoV-2/FLU/RSV testing.  Fact Sheet for Patients: BloggerCourse.com  Fact Sheet for Healthcare Providers: SeriousBroker.it  This test is not yet approved or cleared by the [place]  FDA and has been authorized for detection and/or diagnosis of SARS-CoV-2 by FDA under an Emergency Use Authorization (EUA). This EUA will remain in effect (meaning this test can be used) for the duration of the COVID-19 declaration under Section 564(b)(1) of the Act, 21 U.S.C. section  360bbb-3(b)(1), unless the authorization is terminated or revoked.     Resp Syncytial Virus by PCR NEGATIVE NEGATIVE Final    Comment: (NOTE) Fact Sheet for Patients: BloggerCourse.com  Fact Sheet for Healthcare Providers: SeriousBroker.it  This test is not yet approved or cleared by the [place]  FDA and has been authorized for detection and/or diagnosis of SARS-CoV-2 by FDA under an Emergency Use Authorization (EUA). This EUA will remain in effect (meaning this test can be used) for the duration of the COVID-19 declaration under Section 564(b)(1) of the Act, 21 U.S.C. section 360bbb-3(b)(1), unless the authorization is terminated or revoked.  Performed at [facility], [address] [place], [place] [address]   Blood Culture (routine x 2)     Status: Abnormal   Collection Time: 12/15/23  4:12 PM   Specimen: BLOOD  Result Value Ref Range Status   Specimen Description   Final    BLOOD RIGHT ANTECUBITAL Performed at [facility], [address] [place], [place] [address]    Special Requests   Final    BOTTLES DRAWN AEROBIC AND ANAEROBIC Blood Culture results may not be optimal due to an inadequate volume of blood received in culture bottles  Performed at [facility], [address] [place], [place] [address]    Culture  Setup Time   Final    GRAM NEGATIVE RODS IN BOTH AEROBIC AND ANAEROBIC BOTTLES Organism ID to follow CRITICAL RESULT CALLED TO, READ BACK BY AND VERIFIED WITH:  JASON ROBINS AT 1093 12/16/23 JG Performed at [facility], [address] [place], [place] [address]    Culture ESCHERICHIA COLI (A)  Final   Report Status 12/18/2023 FINAL  Final   Organism ID, Bacteria ESCHERICHIA COLI  Final   Organism ID, Bacteria ESCHERICHIA COLI  Final      Susceptibility   Escherichia coli - KIRBY BAUER*    CEFAZOLIN  RESISTANT Resistant    Escherichia coli - MIC*     AMPICILLIN >=32 RESISTANT Resistant     CEFEPIME  <=0.12 SENSITIVE Sensitive     CEFTAZIDIME <=1 SENSITIVE Sensitive     CEFTRIAXONE  <=0.25 SENSITIVE Sensitive     CIPROFLOXACIN >=4 RESISTANT Resistant     GENTAMICIN <=1 SENSITIVE Sensitive     IMIPENEM <=0.25 SENSITIVE Sensitive     TRIMETH /SULFA  <=20 SENSITIVE Sensitive     AMPICILLIN/SULBACTAM >=32 RESISTANT Resistant     PIP/TAZO <=4 SENSITIVE Sensitive ug/mL    * ESCHERICHIA COLI    ESCHERICHIA COLI  Aerobic/Anaerobic Culture w Gram Stain (surgical/deep wound)     Status: None   Collection Time: 12/15/23  4:12 PM   Specimen: Wound  Result Value Ref Range Status   Specimen Description   Final    WOUND Performed at [facility], [address], [place], [place] [address]    Special Requests   Final    SACRAL Performed at [facility], [address] [place], [place] [address]    Gram Stain NO WBC SEEN RARE GRAM NEGATIVE RODS   Final   Culture   Final    ABUNDANT KLEBSIELLA PNEUMONIAE FEW ESCHERICHIA COLI FEW BACTEROIDES FRAGILIS BETA LACTAMASE POSITIVE FEW ENTEROCOCCUS FAECALIS FEW CORYNEBACTERIUM STRIATUM Standardized susceptibility testing for this organism is not available. Performed at [facility], [address], [place], [place] [address]    Report Status 12/19/2023 FINAL  Final   Organism ID, Bacteria KLEBSIELLA PNEUMONIAE  Final   Organism ID, Bacteria ESCHERICHIA COLI  Final   Organism ID, Bacteria ENTEROCOCCUS FAECALIS  Final      Susceptibility   Escherichia coli - MIC*    AMPICILLIN >=32 RESISTANT Resistant     CEFEPIME  <=0.12 SENSITIVE Sensitive     CEFTAZIDIME <=1 SENSITIVE Sensitive     CEFTRIAXONE  <=0.25 SENSITIVE Sensitive     CIPROFLOXACIN >=4 RESISTANT Resistant     GENTAMICIN <=1 SENSITIVE Sensitive     IMIPENEM <=0.25 SENSITIVE Sensitive     TRIMETH /SULFA  <=20 SENSITIVE Sensitive     AMPICILLIN/SULBACTAM >=32 RESISTANT Resistant     PIP/TAZO <=4 SENSITIVE Sensitive  ug/mL    * FEW ESCHERICHIA COLI   Enterococcus faecalis - MIC*    AMPICILLIN <=2 SENSITIVE Sensitive     VANCOMYCIN  >=32 RESISTANT Resistant     GENTAMICIN SYNERGY SENSITIVE Sensitive     * FEW ENTEROCOCCUS FAECALIS   Klebsiella pneumoniae - MIC*    AMPICILLIN RESISTANT Resistant     CEFEPIME  <=0.12 SENSITIVE Sensitive     CEFTAZIDIME <=1 SENSITIVE Sensitive     CEFTRIAXONE  <=0.25 SENSITIVE Sensitive     CIPROFLOXACIN <=0.25 SENSITIVE Sensitive     GENTAMICIN <=1 SENSITIVE Sensitive     IMIPENEM <=0.25 SENSITIVE Sensitive  TRIMETH /SULFA  <=20 SENSITIVE Sensitive     AMPICILLIN/SULBACTAM <=2 SENSITIVE Sensitive     PIP/TAZO <=4 SENSITIVE Sensitive ug/mL    * ABUNDANT KLEBSIELLA PNEUMONIAE  Blood Culture ID Panel (Reflexed)     Status: Abnormal   Collection Time: 12/15/23  4:12 PM  Result Value Ref Range Status   Enterococcus faecalis NOT DETECTED NOT DETECTED Final   Enterococcus Faecium NOT DETECTED NOT DETECTED Final   Listeria monocytogenes NOT DETECTED NOT DETECTED Final   Staphylococcus species NOT DETECTED NOT DETECTED Final   Staphylococcus aureus (BCID) NOT DETECTED NOT DETECTED Final   Staphylococcus epidermidis NOT DETECTED NOT DETECTED Final   Staphylococcus lugdunensis NOT DETECTED NOT DETECTED Final   Streptococcus species NOT DETECTED NOT DETECTED Final   Streptococcus agalactiae NOT DETECTED NOT DETECTED Final   Streptococcus pneumoniae NOT DETECTED NOT DETECTED Final   Streptococcus pyogenes NOT DETECTED NOT DETECTED Final   A.calcoaceticus-baumannii NOT DETECTED NOT DETECTED Final   Bacteroides fragilis NOT DETECTED NOT DETECTED Final   Enterobacterales DETECTED (A) NOT DETECTED Final    Comment: Enterobacterales represent a large order of gram negative bacteria, not a single organism. CRITICAL RESULT CALLED TO, READ BACK BY AND VERIFIED WITH:  JASON ROBINS AT 4098 12/16/23 JG    Enterobacter cloacae complex NOT DETECTED NOT DETECTED Final   Escherichia coli  DETECTED (A) NOT DETECTED Final    Comment: CRITICAL RESULT CALLED TO, READ BACK BY AND VERIFIED WITH:  JASON ROBINS AT 0529 12/16/23 JG    Klebsiella aerogenes NOT DETECTED NOT DETECTED Final   Klebsiella oxytoca NOT DETECTED NOT DETECTED Final   Klebsiella pneumoniae NOT DETECTED NOT DETECTED Final   Proteus species NOT DETECTED NOT DETECTED Final   Salmonella species NOT DETECTED NOT DETECTED Final   Serratia marcescens NOT DETECTED NOT DETECTED Final   Haemophilus influenzae NOT DETECTED NOT DETECTED Final   Neisseria meningitidis NOT DETECTED NOT DETECTED Final   Pseudomonas aeruginosa NOT DETECTED NOT DETECTED Final   Stenotrophomonas maltophilia NOT DETECTED NOT DETECTED Final   Candida albicans NOT DETECTED NOT DETECTED Final   Candida auris NOT DETECTED NOT DETECTED Final   Candida glabrata NOT DETECTED NOT DETECTED Final   Candida krusei NOT DETECTED NOT DETECTED Final   Candida parapsilosis NOT DETECTED NOT DETECTED Final   Candida tropicalis NOT DETECTED NOT DETECTED Final   Cryptococcus neoformans/gattii NOT DETECTED NOT DETECTED Final   CTX-M ESBL NOT DETECTED NOT DETECTED Final   Carbapenem resistance IMP NOT DETECTED NOT DETECTED Final   Carbapenem resistance KPC NOT DETECTED NOT DETECTED Final   Carbapenem resistance NDM NOT DETECTED NOT DETECTED Final   Carbapenem resist OXA 48 LIKE NOT DETECTED NOT DETECTED Final   Carbapenem resistance VIM NOT DETECTED NOT DETECTED Final    Comment: Performed at [facility], [address] [place], [place] [address]  Blood Culture (routine x 2)     Status: Abnormal   Collection Time: 12/15/23  4:30 PM   Specimen: BLOOD  Result Value Ref Range Status   Specimen Description   Final    BLOOD RIGHT ANTECUBITAL Performed at [facility], [address] [place], [place] [address]    Special Requests   Final    BOTTLES DRAWN AEROBIC AND ANAEROBIC Blood Culture results may not be optimal due to an inadequate  volume of blood received in culture bottles Performed at [facility], [address] [place], [place] [address]    Culture  Setup Time   Final  GRAM NEGATIVE RODS IN BOTH AEROBIC AND ANAEROBIC BOTTLES CRITICAL VALUE NOTED.  VALUE IS CONSISTENT WITH PREVIOUSLY REPORTED AND CALLED VALUE. Performed at [facility], [address] [place], [place] [address]    Culture (A)  Final    ESCHERICHIA COLI SUSCEPTIBILITIES PERFORMED ON PREVIOUS CULTURE WITHIN THE LAST 5 DAYS. Performed at [facility], [address] [place], [place] [address]    Report Status 12/18/2023 FINAL  Final  Urine Culture (for pregnant, neutropenic or urologic patients or patients with an indwelling urinary catheter)     Status: None   Collection Time: 12/15/23  5:40 PM   Specimen: Urine, Clean Catch  Result Value Ref Range Status   Specimen Description   Final    URINE, CLEAN CATCH Performed at [facility], [address], [place], [place] [address]    Special Requests   Final    NONE Performed at [facility], [address], [place], [place] [address]    Culture   Final    NO GROWTH Performed at [facility], [address] [place], [place] [address]    Report Status 12/16/2023 FINAL  Final  MRSA Next Gen by PCR, Nasal     Status: None   Collection Time: 12/16/23  5:28 PM   Specimen: Nasal Mucosa; Nasal Swab  Result Value Ref Range Status   MRSA by PCR Next Gen NOT DETECTED NOT DETECTED Final    Comment: (NOTE) The GeneXpert MRSA Assay (FDA approved for NASAL specimens only), is one component of a comprehensive MRSA colonization surveillance program. It is not intended to diagnose MRSA infection nor to guide or monitor treatment for MRSA infections. Test performance is not FDA approved in patients less than 45 years old. Performed at [facility], [address] [place], [place] [address]     Procedures and diagnostic  studies:  No results found.              LOS: 6 days   Kynlei Piontek  [facility]   Pager on [email or web address]. If 7PM-7AM, please contact night-coverage at [email or web address]     12/21/2023, 12:22 PM

## 2023-12-21 NOTE — Progress Notes (Signed)
 [facility]  ROUNDING NOTE   Subjective:   Patient resting quietly this morning Not as oriented as yesterday Reports soreness in sacral area.   Scheduled for dialysis later today  Objective:  Vital signs in last 24 hours:  Temp:  [98 F (36.7 C)-98.6 F (37 C)] 98.2 F (36.8 C) (06/10 0729) Pulse Rate:  [71-73] 73 (06/10 0729) Resp:  [14-17] 14 (06/10 0729) BP: (136-160)/(63-77) 145/63 (06/10 0729) SpO2:  [99 %-100 %] 100 % (06/10 0729) Weight:  [51.3 kg] 51.3 kg (06/10 0500)  Weight change: -5.544 kg Filed Weights   12/19/23 0500 12/20/23 0616 12/21/23 0500  Weight: 57 kg 56.8 kg 51.3 kg    Intake/Output: I/O last 3 completed shifts: In: 263.3 [I.V.:13.3; Other:150; IV Piggyback:100] Out: 25 [Drains:25]   Intake/Output this shift:  Total I/O In: 100 [IV Piggyback:100] Out: -   Physical Exam: General: NAD  Head: Normocephalic, atraumatic. Moist oral mucosal membranes  Eyes: Anicteric  Lungs:  Clear to auscultation  Heart: Regular rate and rhythm  Abdomen:  Soft, nontender  Extremities:  No peripheral edema.  Neurologic: Awake, alert  Skin: Warm,dry, large sacral wound (NPWV)  Access: Rt internal jugular permcath    Basic Metabolic Panel: Recent Labs  Lab 12/16/23 0532 12/17/23 0610 12/18/23 0442 12/20/23 0114 12/21/23 0800  NA 139 138 139 137 136  K 3.6 3.5 3.7 2.9* 4.1  CL 102 100 99 95* 96*  CO2 27 27 27 28 26   GLUCOSE 110* 83 92 86 93  BUN 53* 31* 51* 41* 53*  CREATININE 4.13* 2.53* 3.38* 2.92* 3.76*  CALCIUM  9.0 8.7* 8.8* 8.8* 8.8*    Liver Function Tests: Recent Labs  Lab 12/15/23 1819 12/16/23 0532 12/17/23 0610 12/18/23 0442 12/20/23 0114  AST 214* 184* 137* 128* 91*  ALT 67* 61* 44 38 28  ALKPHOS 1,035* 974* 829* 775* 714*  BILITOT 0.9 1.4* 1.3* 0.9 0.8  PROT 6.9 6.9 6.5 6.7 7.1  ALBUMIN 2.1* 2.1* 2.0* 2.0* 2.2*   No results for input(s): "LIPASE", "AMYLASE" in the last 168 hours. Recent Labs  Lab  12/15/23 1819  AMMONIA <13    CBC: Recent Labs  Lab 12/15/23 1819 12/16/23 0532 12/17/23 0610 12/18/23 0442 12/20/23 0114 12/21/23 0800  WBC 18.2* 21.3* 21.0* 16.2* 13.2* 13.0*  NEUTROABS 15.8*  --   --  14.0* 10.9*  --   HGB 7.7* 6.7* 7.9* 8.2* 8.7* 9.4*  HCT 24.8* 22.3* 24.8* 25.5* 28.5* 30.2*  MCV 92.5 93.7 89.9 88.9 90.5 89.1  PLT 414* 415* 438* 407* 439* 436*    Cardiac Enzymes: Recent Labs  Lab 12/15/23 1819  CKTOTAL 11*    BNP: Invalid input(s): "POCBNP"  CBG: Recent Labs  Lab 12/16/23 0917  GLUCAP 111*    Microbiology: Results for orders placed or performed during the hospital encounter of 12/15/23  Resp panel by RT-PCR (RSV, Flu A&B, Covid) Anterior Nasal Swab     Status: None   Collection Time: 12/15/23  4:11 PM   Specimen: Anterior Nasal Swab  Result Value Ref Range Status   SARS Coronavirus 2 by RT PCR NEGATIVE NEGATIVE Final    Comment: (NOTE) SARS-CoV-2 target nucleic acids are NOT DETECTED.  The SARS-CoV-2 RNA is generally detectable in upper respiratory specimens during the acute phase of infection. The lowest concentration of SARS-CoV-2 viral copies this assay can detect is 138 copies/mL. A negative result does not preclude SARS-Cov-2 infection and should not be used as the sole basis for treatment or  other patient management decisions. A negative result may occur with  improper specimen collection/handling, submission of specimen other than nasopharyngeal swab, presence of viral mutation(s) within the areas targeted by this assay, and inadequate number of viral copies(<138 copies/mL). A negative result must be combined with clinical observations, patient history, and epidemiological information. The expected result is Negative.  Fact Sheet for Patients:  BloggerCourse.com  Fact Sheet for Healthcare Providers:  SeriousBroker.it  This test is no t yet approved or cleared by the [place] FDA and  has been authorized for detection and/or diagnosis of SARS-CoV-2 by FDA under an Emergency Use Authorization (EUA). This EUA will remain  in effect (meaning this test can be used) for the duration of the COVID-19 declaration under Section 564(b)(1) of the Act, 21 U.S.C.section 360bbb-3(b)(1), unless the authorization is terminated  or revoked sooner.       Influenza A by PCR NEGATIVE NEGATIVE Final   Influenza B by PCR NEGATIVE NEGATIVE Final    Comment: (NOTE) The Xpert Xpress SARS-CoV-2/FLU/RSV plus assay is intended as an aid in the diagnosis of influenza from Nasopharyngeal swab specimens and should not be used as a sole basis for treatment. Nasal washings and aspirates are unacceptable for Xpert Xpress SARS-CoV-2/FLU/RSV testing.  Fact Sheet for Patients: BloggerCourse.com  Fact Sheet for Healthcare Providers: SeriousBroker.it  This test is not yet approved or cleared by the [place]  FDA and has been authorized for detection and/or diagnosis of SARS-CoV-2 by FDA under an Emergency Use Authorization (EUA). This EUA will remain in effect (meaning this test can be used) for the duration of the COVID-19 declaration under Section 564(b)(1) of the Act, 21 U.S.C. section 360bbb-3(b)(1), unless the authorization is terminated or revoked.     Resp Syncytial Virus by PCR NEGATIVE NEGATIVE Final    Comment: (NOTE) Fact Sheet for Patients: BloggerCourse.com  Fact Sheet for Healthcare Providers: SeriousBroker.it  This test is not yet approved or cleared by the [place]  FDA and has been authorized for detection and/or diagnosis of SARS-CoV-2 by FDA under an Emergency Use Authorization (EUA). This EUA will remain in effect (meaning this test can be used) for the duration of the COVID-19 declaration under Section 564(b)(1) of the Act, 21 U.S.C. section  360bbb-3(b)(1), unless the authorization is terminated or revoked.  Performed at [facility], [address] [place], [place] [address]   Blood Culture (routine x 2)     Status: Abnormal   Collection Time: 12/15/23  4:12 PM   Specimen: BLOOD  Result Value Ref Range Status   Specimen Description   Final    BLOOD RIGHT ANTECUBITAL Performed at [facility], [address] [place], [place] [address]    Special Requests   Final    BOTTLES DRAWN AEROBIC AND ANAEROBIC Blood Culture results may not be optimal due to an inadequate volume of blood received in culture bottles Performed at [facility], [address], [place], [place] [address]    Culture  Setup Time   Final    GRAM NEGATIVE RODS IN BOTH AEROBIC AND ANAEROBIC BOTTLES Organism ID to follow CRITICAL RESULT CALLED TO, READ BACK BY AND VERIFIED WITHLatina Pol AT 9147 12/16/23 JG Performed at [facility], [address] [place], [place] [address]    Culture ESCHERICHIA COLI (A)  Final   Report Status 12/18/2023 FINAL  Final   Organism ID, Bacteria ESCHERICHIA COLI  Final   Organism ID, Bacteria ESCHERICHIA COLI  Final  Susceptibility   Escherichia coli - KIRBY BAUER*    CEFAZOLIN  RESISTANT Resistant    Escherichia coli - MIC*    AMPICILLIN >=32 RESISTANT Resistant     CEFEPIME  <=0.12 SENSITIVE Sensitive     CEFTAZIDIME <=1 SENSITIVE Sensitive     CEFTRIAXONE  <=0.25 SENSITIVE Sensitive     CIPROFLOXACIN >=4 RESISTANT Resistant     GENTAMICIN <=1 SENSITIVE Sensitive     IMIPENEM <=0.25 SENSITIVE Sensitive     TRIMETH /SULFA  <=20 SENSITIVE Sensitive     AMPICILLIN/SULBACTAM >=32 RESISTANT Resistant     PIP/TAZO <=4 SENSITIVE Sensitive ug/mL    * ESCHERICHIA COLI    ESCHERICHIA COLI  Aerobic/Anaerobic Culture w Gram Stain (surgical/deep wound)     Status: None   Collection Time: 12/15/23  4:12 PM   Specimen: Wound  Result Value Ref Range Status   Specimen Description    Final    WOUND Performed at [facility], [address] [place], [place] [address]    Special Requests   Final    SACRAL Performed at [facility], [address] [place], [place] [address]    Gram Stain NO WBC SEEN RARE GRAM NEGATIVE RODS   Final   Culture   Final    ABUNDANT KLEBSIELLA PNEUMONIAE FEW ESCHERICHIA COLI FEW BACTEROIDES FRAGILIS BETA LACTAMASE POSITIVE FEW ENTEROCOCCUS FAECALIS FEW CORYNEBACTERIUM STRIATUM Standardized susceptibility testing for this organism is not available. Performed at [facility], [address], [place], [place] [address]    Report Status 12/19/2023 FINAL  Final   Organism ID, Bacteria KLEBSIELLA PNEUMONIAE  Final   Organism ID, Bacteria ESCHERICHIA COLI  Final   Organism ID, Bacteria ENTEROCOCCUS FAECALIS  Final      Susceptibility   Escherichia coli - MIC*    AMPICILLIN >=32 RESISTANT Resistant     CEFEPIME  <=0.12 SENSITIVE Sensitive     CEFTAZIDIME <=1 SENSITIVE Sensitive     CEFTRIAXONE  <=0.25 SENSITIVE Sensitive     CIPROFLOXACIN >=4 RESISTANT Resistant     GENTAMICIN <=1 SENSITIVE Sensitive     IMIPENEM <=0.25 SENSITIVE Sensitive     TRIMETH /SULFA  <=20 SENSITIVE Sensitive     AMPICILLIN/SULBACTAM >=32 RESISTANT Resistant     PIP/TAZO <=4 SENSITIVE Sensitive ug/mL    * FEW ESCHERICHIA COLI   Enterococcus faecalis - MIC*    AMPICILLIN <=2 SENSITIVE Sensitive     VANCOMYCIN  >=32 RESISTANT Resistant     GENTAMICIN SYNERGY SENSITIVE Sensitive     * FEW ENTEROCOCCUS FAECALIS   Klebsiella pneumoniae - MIC*    AMPICILLIN RESISTANT Resistant     CEFEPIME  <=0.12 SENSITIVE Sensitive     CEFTAZIDIME <=1 SENSITIVE Sensitive     CEFTRIAXONE  <=0.25 SENSITIVE Sensitive     CIPROFLOXACIN <=0.25 SENSITIVE Sensitive     GENTAMICIN <=1 SENSITIVE Sensitive     IMIPENEM <=0.25 SENSITIVE Sensitive     TRIMETH /SULFA  <=20 SENSITIVE Sensitive     AMPICILLIN/SULBACTAM <=2 SENSITIVE Sensitive     PIP/TAZO <=4 SENSITIVE  Sensitive ug/mL    * ABUNDANT KLEBSIELLA PNEUMONIAE  Blood Culture ID Panel (Reflexed)     Status: Abnormal   Collection Time: 12/15/23  4:12 PM  Result Value Ref Range Status   Enterococcus faecalis NOT DETECTED NOT DETECTED Final   Enterococcus Faecium NOT DETECTED NOT DETECTED Final   Listeria monocytogenes NOT DETECTED NOT DETECTED Final   Staphylococcus species NOT DETECTED NOT DETECTED Final   Staphylococcus aureus (BCID) NOT DETECTED NOT DETECTED Final   Staphylococcus epidermidis NOT DETECTED NOT  DETECTED Final   Staphylococcus lugdunensis NOT DETECTED NOT DETECTED Final   Streptococcus species NOT DETECTED NOT DETECTED Final   Streptococcus agalactiae NOT DETECTED NOT DETECTED Final   Streptococcus pneumoniae NOT DETECTED NOT DETECTED Final   Streptococcus pyogenes NOT DETECTED NOT DETECTED Final   A.calcoaceticus-baumannii NOT DETECTED NOT DETECTED Final   Bacteroides fragilis NOT DETECTED NOT DETECTED Final   Enterobacterales DETECTED (A) NOT DETECTED Final    Comment: Enterobacterales represent a large order of gram negative bacteria, not a single organism. CRITICAL RESULT CALLED TO, READ BACK BY AND VERIFIED WITH:  JASON ROBINS AT 6213 12/16/23 JG    Enterobacter cloacae complex NOT DETECTED NOT DETECTED Final   Escherichia coli DETECTED (A) NOT DETECTED Final    Comment: CRITICAL RESULT CALLED TO, READ BACK BY AND VERIFIED WITH:  JASON ROBINS AT 0529 12/16/23 JG    Klebsiella aerogenes NOT DETECTED NOT DETECTED Final   Klebsiella oxytoca NOT DETECTED NOT DETECTED Final   Klebsiella pneumoniae NOT DETECTED NOT DETECTED Final   Proteus species NOT DETECTED NOT DETECTED Final   Salmonella species NOT DETECTED NOT DETECTED Final   Serratia marcescens NOT DETECTED NOT DETECTED Final   Haemophilus influenzae NOT DETECTED NOT DETECTED Final   Neisseria meningitidis NOT DETECTED NOT DETECTED Final   Pseudomonas aeruginosa NOT DETECTED NOT DETECTED Final   Stenotrophomonas  maltophilia NOT DETECTED NOT DETECTED Final   Candida albicans NOT DETECTED NOT DETECTED Final   Candida auris NOT DETECTED NOT DETECTED Final   Candida glabrata NOT DETECTED NOT DETECTED Final   Candida krusei NOT DETECTED NOT DETECTED Final   Candida parapsilosis NOT DETECTED NOT DETECTED Final   Candida tropicalis NOT DETECTED NOT DETECTED Final   Cryptococcus neoformans/gattii NOT DETECTED NOT DETECTED Final   CTX-M ESBL NOT DETECTED NOT DETECTED Final   Carbapenem resistance IMP NOT DETECTED NOT DETECTED Final   Carbapenem resistance KPC NOT DETECTED NOT DETECTED Final   Carbapenem resistance NDM NOT DETECTED NOT DETECTED Final   Carbapenem resist OXA 48 LIKE NOT DETECTED NOT DETECTED Final   Carbapenem resistance VIM NOT DETECTED NOT DETECTED Final    Comment: Performed at [facility], [address] [place], [place] [address]  Blood Culture (routine x 2)     Status: Abnormal   Collection Time: 12/15/23  4:30 PM   Specimen: BLOOD  Result Value Ref Range Status   Specimen Description   Final    BLOOD RIGHT ANTECUBITAL Performed at [facility], [address] [place], [place] [address]    Special Requests   Final    BOTTLES DRAWN AEROBIC AND ANAEROBIC Blood Culture results may not be optimal due to an inadequate volume of blood received in culture bottles Performed at [facility], [address] [place], [place] [address]    Culture  Setup Time   Final    GRAM NEGATIVE RODS IN BOTH AEROBIC AND ANAEROBIC BOTTLES CRITICAL VALUE NOTED.  VALUE IS CONSISTENT WITH PREVIOUSLY REPORTED AND CALLED VALUE. Performed at [facility], [address] [place], [place] [address]    Culture (A)  Final    ESCHERICHIA COLI SUSCEPTIBILITIES PERFORMED ON PREVIOUS CULTURE WITHIN THE LAST 5 DAYS. Performed at [facility], [address], [place], [place] [address]    Report Status 12/18/2023 FINAL  Final  Urine Culture (for pregnant,  neutropenic or urologic patients or patients with an indwelling urinary catheter)     Status: None   Collection Time: 12/15/23  5:40 PM   Specimen: Urine, Clean Catch  Result Value Ref Range Status   Specimen Description   Final    URINE, CLEAN CATCH Performed at [facility], [address], [place], [place] [address]    Special Requests   Final    NONE Performed at [facility] [address] [place], [place] [address]    Culture   Final    NO GROWTH Performed at [facility], [address], [place], [place] [address]    Report Status 12/16/2023 FINAL  Final  MRSA Next Gen by PCR, Nasal     Status: None   Collection Time: 12/16/23  5:28 PM   Specimen: Nasal Mucosa; Nasal Swab  Result Value Ref Range Status   MRSA by PCR Next Gen NOT DETECTED NOT DETECTED Final    Comment: (NOTE) The GeneXpert MRSA Assay (FDA approved for NASAL specimens only), is one component of a comprehensive MRSA colonization surveillance program. It is not intended to diagnose MRSA infection nor to guide or monitor treatment for MRSA infections. Test performance is not FDA approved in patients less than 50 years old. Performed at [facility], [address] [place], [place] [address]     Coagulation Studies: No results for input(s): "LABPROT", "INR" in the last 72 hours.   Urinalysis: No results for input(s): "COLORURINE", "LABSPEC", "PHURINE", "GLUCOSEU", "HGBUR", "BILIRUBINUR", "KETONESUR", "PROTEINUR", "UROBILINOGEN", "NITRITE", "LEUKOCYTESUR" in the last 72 hours.  Invalid input(s): "APPERANCEUR"     Imaging: No results found.    Medications:    cefTRIAXone  (ROCEPHIN )  IV Stopped (12/21/23 0721)    vitamin C  500 mg Per Tube BID   aspirin   81 mg Per Tube Daily   Chlorhexidine  Gluconate Cloth  6 each Topical Q0600   cloBAZam   5 mg Per Tube Q12H   feeding supplement  237 mL Oral BID BM   heparin   5,000 Units Subcutaneous Q8H   lacosamide   200  mg Per Tube BID   levETIRAcetam   500 mg Per Tube BID   multivitamin  1 tablet Per Tube QHS   mupirocin  ointment  1 Application Topical BID   nicotine   21 mg Transdermal Daily   omeprazole   20 mg Per Tube Daily   sertraline   25 mg Per Tube Daily   sevelamer  carbonate  800 mg Per Tube TID WC   zinc  sulfate (50mg  elemental zinc )  220 mg Per Tube Daily   hydrALAZINE , hydrOXYzine , ibuprofen , LORazepam , ondansetron  (ZOFRAN ) IV, oxyCODONE , senna-docusate  Assessment/ Plan:  [name] is a 68 y.o.  female with past medical conditions including seizures, chronic biliary duct dilation, CHF, hypertension, stroke, diastolic heart failure, depression anxiety, bipolar, anemia, breast cancer status post right mastectomy, polypharmacy, end-stage renal disease on hemodialysis, who was admitted to [facility] on 12/15/2023 for  Elevated LFTs [R79.89] Liver lesion [K76.9] Dilation of biliary tract [K83.8] Sepsis (HCC) [A41.9] Urinary tract infection without hematuria, site unspecified [N39.0] Altered mental status, unspecified altered mental status type [R41.82] Sepsis, due to unspecified organism, unspecified whether acute organ dysfunction present (HCC) [A41.9]   End stage renal disease on hemodialysis. Scheduled for dialysis later today, low UF about 1L as tolerated. Next treatment scheduled for Thursday.   2. Sepsis secondary to UTI and sacral wound infection. Blood culture positive for E coli and Enterobacterales. Prescribed Vancomycin , Ceftriaxone  and metronidazole .   3. Chronic biliary dilation with suspected cholangitis and liver abscess. CT abdomen and pelvis with contrast shows diffuse biliary duct dilatation  with hypodense nodular areas in the liver concerning for intrahepatic abscesses versus metastatic disease. ERCP completed on 6/6 with biliary sphincterotomy.   4. Anemia of chronic kidney disease Lab Results  Component Value Date   HGB 9.4 (L) 12/21/2023     Due to breast cancer history,  will avoid ESA's. Will continue to monitor hemoglobin.   5. Secondary Hyperparathyroidism: with outpatient labs: PTH 354 on 09/01/23.    Lab Results  Component Value Date   CALCIUM  8.8 (L) 12/21/2023   CAION 1.13 (L) 05/25/2023   PHOS 6.2 (H) 05/26/2023    Patient prescribed auryxia and sevelamer  outpatient. Remains on sevelamer .   6. Hypertension with chronic kidney disease. Home regimen includes carvedilol , and hydralazine . Held at this time. Blood pressure 145/63, stable for this patient.    LOS: 6 Damarius Karnes 6/10/202512:14 PM

## 2023-12-21 NOTE — Plan of Care (Signed)
 Pt refused to eat dinner, attempted to encourage patient but declined dinner stating she is not hungry. Pt tx to low air loss specialty bed to help with pressure reduction and wound healing.   Problem: Education: Goal: Knowledge of General Education information will improve Description: Including pain rating scale, medication(s)/side effects and non-pharmacologic comfort measures Outcome: Progressing   Problem: Clinical Measurements: Goal: Ability to maintain clinical measurements within normal limits will improve Outcome: Progressing Goal: Will remain free from infection Outcome: Progressing   Problem: Coping: Goal: Level of anxiety will decrease Outcome: Progressing   Problem: Pain Managment: Goal: General experience of comfort will improve and/or be controlled Outcome: Progressing   Problem: Safety: Goal: Ability to remain free from injury will improve Outcome: Progressing

## 2023-12-22 DIAGNOSIS — A419 Sepsis, unspecified organism: Secondary | ICD-10-CM | POA: Diagnosis not present

## 2023-12-22 DIAGNOSIS — R6520 Severe sepsis without septic shock: Secondary | ICD-10-CM | POA: Diagnosis not present

## 2023-12-22 MED ORDER — NEPRO/CARBSTEADY PO LIQD
237.0000 mL | Freq: Three times a day (TID) | ORAL | Status: DC
  Administered 2023-12-22 – 2023-12-24 (×5): 237 mL via ORAL

## 2023-12-22 MED ORDER — NEPRO/CARBSTEADY PO LIQD: 237.0000 mL | Freq: Two times a day (BID) | ORAL | Status: DC

## 2023-12-22 NOTE — Progress Notes (Signed)
 INFECTIOUS DISEASE PROGRESS NOTE Date of Admission:  12/15/2023     ID: Sherry [name] is a 68 y.o. female with  bacteremia, cholagitis  Principal Problem:   Severe sepsis (HCC) Active Problems:   Hx of bipolar disorder   Essential hypertension   Cigarette smoker   Chronic diastolic CHF (congestive heart failure) (HCC)   Stroke (HCC)   ESRD on dialysis (HCC)   UTI (urinary tract infection)   Liver lesion   Acute metabolic encephalopathy   Sacral wound   Abnormal liver function   Seizure (HCC)   E coli bacteremia   Cholangitis   Calculus of bile duct without cholecystitis with obstruction   Malnutrition of moderate degree   Subjective: NO fevers, wbc down to 13 Day 6 of abx.  ROS  Eleven systems are reviewed and negative except per hpi  Medications:  Antibiotics Given (last 72 hours)     Date/Time Action Medication Dose Rate   12/19/23 2104 Given   metroNIDAZOLE  (FLAGYL ) tablet 500 mg 500 mg    12/20/23 0609 New Bag/Given   cefTRIAXone  (ROCEPHIN ) 2 g in sodium chloride  0.9 % 100 mL IVPB 2 g 200 mL/hr   12/20/23 0947 Given   metroNIDAZOLE  (FLAGYL ) tablet 500 mg 500 mg    12/21/23 0651 New Bag/Given   cefTRIAXone  (ROCEPHIN ) 2 g in sodium chloride  0.9 % 100 mL IVPB 2 g 200 mL/hr   12/22/23 0641 New Bag/Given   cefTRIAXone  (ROCEPHIN ) 2 g in sodium chloride  0.9 % 100 mL IVPB 2 g 200 mL/hr       vitamin C  500 mg Per Tube BID   aspirin   81 mg Per Tube Daily   Chlorhexidine  Gluconate Cloth  6 each Topical Q0600   cloBAZam   5 mg Per Tube Q12H   feeding supplement  237 mL Oral BID BM   heparin   5,000 Units Subcutaneous Q8H   lacosamide   200 mg Per Tube BID   levETIRAcetam   500 mg Per Tube BID   multivitamin  1 tablet Per Tube QHS   mupirocin  ointment  1 Application Topical BID   nicotine   21 mg Transdermal Daily   omeprazole   20 mg Per Tube Daily   sertraline   25 mg Per Tube Daily   sevelamer  carbonate  800 mg Per Tube TID WC   zinc  sulfate (50mg  elemental zinc )  220  mg Per Tube Daily    Objective: Vital signs in last 24 hours: Temp:  [97.9 F (36.6 C)-98.3 F (36.8 C)] 98.1 F (36.7 C) (06/11 0723) Pulse Rate:  [60-74] 66 (06/11 0723) Resp:  [12-25] 15 (06/11 0723) BP: (107-146)/(52-92) 138/67 (06/11 0723) SpO2:  [96 %-100 %] 100 % (06/11 0723) Weight:  [51.3 kg] 51.3 kg (06/10 1337) Constitutional: chroniclaly ill appearing, lying in bed eating HENT: [place]/AT, PERRLA, no scleral icterus Mouth/Throat: Oropharynx is dry Cardiovascular: Normal rate, regular rhythm Pulmonary/Chest: Effort normal and breath sounds normal. No respiratory distress.  has no wheezes.  Neck = supple, no nuchal rigidity Abdominal: Soft. Mild distention Lymphadenopathy: no cervical adenopathy. No axillary adenopathy Neurological: alert but slowed mentation  HD cath R upper chest Skin: sacreal decub as below 6/9 photo   Lab Results Recent Labs    12/20/23 0114 12/21/23 0800  WBC 13.2* 13.0*  HGB 8.7* 9.4*  HCT 28.5* 30.2*  NA 137 136  K 2.9* 4.1  CL 95* 96*  CO2 28 26  BUN 41* 53*  CREATININE 2.92* 3.76*    Microbiology:  Specimen Description --  WOUND Performed at [facility], [address] [place], [place] [address]   Special Requests --   SACRAL Performed at [facility], [address] [place], [place] [address]   Gram Stain --   NO WBC SEEN RARE GRAM NEGATIVE RODS   Culture --   ABUNDANT KLEBSIELLA PNEUMONIAE FEW ESCHERICHIA COLI FEW BACTEROIDES FRAGILIS BETA LACTAMASE POSITIVE FEW ENTEROCOCCUS FAECALIS FEW CORYNEBACTERIUM STRIATUM Standardized susceptibility testing for this organism is not available. Performed at [facility], [address], [place], [place] [address]   Report Status 12/19/2023 FINAL   Organism ID, Bacteria KLEBSIELLA PNEUMONIAE   Organism ID, Bacteria ESCHERICHIA COLI   Organism ID, Bacteria ENTEROCOCCUS FAECALIS   Results for orders placed or performed during the hospital encounter of  12/15/23  Resp panel by RT-PCR (RSV, Flu A&B, Covid) Anterior Nasal Swab     Status: None   Collection Time: 12/15/23  4:11 PM   Specimen: Anterior Nasal Swab  Result Value Ref Range Status   SARS Coronavirus 2 by RT PCR NEGATIVE NEGATIVE Final    Comment: (NOTE) SARS-CoV-2 target nucleic acids are NOT DETECTED.  The SARS-CoV-2 RNA is generally detectable in upper respiratory specimens during the acute phase of infection. The lowest concentration of SARS-CoV-2 viral copies this assay can detect is 138 copies/mL. A negative result does not preclude SARS-Cov-2 infection and should not be used as the sole basis for treatment or other patient management decisions. A negative result may occur with  improper specimen collection/handling, submission of specimen other than nasopharyngeal swab, presence of viral mutation(s) within the areas targeted by this assay, and inadequate number of viral copies(<138 copies/mL). A negative result must be combined with clinical observations, patient history, and epidemiological information. The expected result is Negative.  Fact Sheet for Patients:  BloggerCourse.com  Fact Sheet for Healthcare Providers:  SeriousBroker.it  This test is no t yet approved or cleared by the [place]  FDA and  has been authorized for detection and/or diagnosis of SARS-CoV-2 by FDA under an Emergency Use Authorization (EUA). This EUA will remain  in effect (meaning this test can be used) for the duration of the COVID-19 declaration under Section 564(b)(1) of the Act, 21 U.S.C.section 360bbb-3(b)(1), unless the authorization is terminated  or revoked sooner.       Influenza A by PCR NEGATIVE NEGATIVE Final   Influenza B by PCR NEGATIVE NEGATIVE Final    Comment: (NOTE) The Xpert Xpress SARS-CoV-2/FLU/RSV plus assay is intended as an aid in the diagnosis of influenza from Nasopharyngeal swab specimens and should not  be used as a sole basis for treatment. Nasal washings and aspirates are unacceptable for Xpert Xpress SARS-CoV-2/FLU/RSV testing.  Fact Sheet for Patients: BloggerCourse.com  Fact Sheet for Healthcare Providers: SeriousBroker.it  This test is not yet approved or cleared by the [place]  FDA and has been authorized for detection and/or diagnosis of SARS-CoV-2 by FDA under an Emergency Use Authorization (EUA). This EUA will remain in effect (meaning this test can be used) for the duration of the COVID-19 declaration under Section 564(b)(1) of the Act, 21 U.S.C. section 360bbb-3(b)(1), unless the authorization is terminated or revoked.     Resp Syncytial Virus by PCR NEGATIVE NEGATIVE Final    Comment: (NOTE) Fact Sheet for Patients: BloggerCourse.com  Fact Sheet for Healthcare Providers: SeriousBroker.it  This test is not yet approved or cleared by the [place]  FDA and has been authorized for detection and/or diagnosis of SARS-CoV-2 by FDA under an  Emergency Use Authorization (EUA). This EUA will remain in effect (meaning this test can be used) for the duration of the COVID-19 declaration under Section 564(b)(1) of the Act, 21 U.S.C. section 360bbb-3(b)(1), unless the authorization is terminated or revoked.  Performed at [facility], [address], [place], [place] [address]   Blood Culture (routine x 2)     Status: Abnormal   Collection Time: 12/15/23  4:12 PM   Specimen: BLOOD  Result Value Ref Range Status   Specimen Description   Final    BLOOD RIGHT ANTECUBITAL Performed at [facility], [address], [place], [place] [address]    Special Requests   Final    BOTTLES DRAWN AEROBIC AND ANAEROBIC Blood Culture results may not be optimal due to an inadequate volume of blood received in culture bottles Performed at [facility], [address] [place], [place] [address]    Culture  Setup Time   Final    GRAM NEGATIVE RODS IN BOTH AEROBIC AND ANAEROBIC BOTTLES Organism ID to follow CRITICAL RESULT CALLED TO, READ BACK BY AND VERIFIED WITH:  JASON ROBINS AT 9147 12/16/23 JG Performed at [facility], [address] [place], [place] [address]    Culture ESCHERICHIA COLI (A)  Final   Report Status 12/18/2023 FINAL  Final   Organism ID, Bacteria ESCHERICHIA COLI  Final   Organism ID, Bacteria ESCHERICHIA COLI  Final      Susceptibility   Escherichia coli - KIRBY BAUER*    CEFAZOLIN  RESISTANT Resistant    Escherichia coli - MIC*    AMPICILLIN >=32 RESISTANT Resistant     CEFEPIME  <=0.12 SENSITIVE Sensitive     CEFTAZIDIME <=1 SENSITIVE Sensitive     CEFTRIAXONE  <=0.25 SENSITIVE Sensitive     CIPROFLOXACIN >=4 RESISTANT Resistant     GENTAMICIN <=1 SENSITIVE Sensitive     IMIPENEM <=0.25 SENSITIVE Sensitive     TRIMETH /SULFA  <=20 SENSITIVE Sensitive     AMPICILLIN/SULBACTAM >=32 RESISTANT Resistant     PIP/TAZO <=4 SENSITIVE Sensitive ug/mL    * ESCHERICHIA COLI    ESCHERICHIA COLI  Aerobic/Anaerobic Culture w Gram Stain (surgical/deep wound)     Status: None   Collection Time: 12/15/23  4:12 PM   Specimen: Wound  Result Value Ref Range Status   Specimen Description   Final    WOUND Performed at [facility], [address], [place], [place] [address]    Special Requests   Final    SACRAL Performed at [facility], [address] [place], [place] [address]    Gram Stain NO WBC SEEN RARE GRAM NEGATIVE RODS   Final   Culture   Final    ABUNDANT KLEBSIELLA PNEUMONIAE FEW ESCHERICHIA COLI FEW BACTEROIDES FRAGILIS BETA LACTAMASE POSITIVE FEW ENTEROCOCCUS FAECALIS FEW CORYNEBACTERIUM STRIATUM Standardized susceptibility testing for this organism is not available. Performed at [facility], [address] [place], [place] [address]    Report Status 12/19/2023 FINAL   Final   Organism ID, Bacteria KLEBSIELLA PNEUMONIAE  Final   Organism ID, Bacteria ESCHERICHIA COLI  Final   Organism ID, Bacteria ENTEROCOCCUS FAECALIS  Final      Susceptibility   Escherichia coli - MIC*    AMPICILLIN >=32 RESISTANT Resistant     CEFEPIME  <=0.12 SENSITIVE Sensitive     CEFTAZIDIME <=1 SENSITIVE Sensitive     CEFTRIAXONE  <=0.25 SENSITIVE Sensitive     CIPROFLOXACIN >=4 RESISTANT Resistant     GENTAMICIN <=1  SENSITIVE Sensitive     IMIPENEM <=0.25 SENSITIVE Sensitive     TRIMETH /SULFA  <=20 SENSITIVE Sensitive     AMPICILLIN/SULBACTAM >=32 RESISTANT Resistant     PIP/TAZO <=4 SENSITIVE Sensitive ug/mL    * FEW ESCHERICHIA COLI   Enterococcus faecalis - MIC*    AMPICILLIN <=2 SENSITIVE Sensitive     VANCOMYCIN  >=32 RESISTANT Resistant     GENTAMICIN SYNERGY SENSITIVE Sensitive     * FEW ENTEROCOCCUS FAECALIS   Klebsiella pneumoniae - MIC*    AMPICILLIN RESISTANT Resistant     CEFEPIME  <=0.12 SENSITIVE Sensitive     CEFTAZIDIME <=1 SENSITIVE Sensitive     CEFTRIAXONE  <=0.25 SENSITIVE Sensitive     CIPROFLOXACIN <=0.25 SENSITIVE Sensitive     GENTAMICIN <=1 SENSITIVE Sensitive     IMIPENEM <=0.25 SENSITIVE Sensitive     TRIMETH /SULFA  <=20 SENSITIVE Sensitive     AMPICILLIN/SULBACTAM <=2 SENSITIVE Sensitive     PIP/TAZO <=4 SENSITIVE Sensitive ug/mL    * ABUNDANT KLEBSIELLA PNEUMONIAE  Blood Culture ID Panel (Reflexed)     Status: Abnormal   Collection Time: 12/15/23  4:12 PM  Result Value Ref Range Status   Enterococcus faecalis NOT DETECTED NOT DETECTED Final   Enterococcus Faecium NOT DETECTED NOT DETECTED Final   Listeria monocytogenes NOT DETECTED NOT DETECTED Final   Staphylococcus species NOT DETECTED NOT DETECTED Final   Staphylococcus aureus (BCID) NOT DETECTED NOT DETECTED Final   Staphylococcus epidermidis NOT DETECTED NOT DETECTED Final   Staphylococcus lugdunensis NOT DETECTED NOT DETECTED Final   Streptococcus species NOT DETECTED NOT DETECTED  Final   Streptococcus agalactiae NOT DETECTED NOT DETECTED Final   Streptococcus pneumoniae NOT DETECTED NOT DETECTED Final   Streptococcus pyogenes NOT DETECTED NOT DETECTED Final   A.calcoaceticus-baumannii NOT DETECTED NOT DETECTED Final   Bacteroides fragilis NOT DETECTED NOT DETECTED Final   Enterobacterales DETECTED (A) NOT DETECTED Final    Comment: Enterobacterales represent a large order of gram negative bacteria, not a single organism. CRITICAL RESULT CALLED TO, READ BACK BY AND VERIFIED WITH:  JASON ROBINS AT 2952 12/16/23 JG    Enterobacter cloacae complex NOT DETECTED NOT DETECTED Final   Escherichia coli DETECTED (A) NOT DETECTED Final    Comment: CRITICAL RESULT CALLED TO, READ BACK BY AND VERIFIED WITH:  JASON ROBINS AT 0529 12/16/23 JG    Klebsiella aerogenes NOT DETECTED NOT DETECTED Final   Klebsiella oxytoca NOT DETECTED NOT DETECTED Final   Klebsiella pneumoniae NOT DETECTED NOT DETECTED Final   Proteus species NOT DETECTED NOT DETECTED Final   Salmonella species NOT DETECTED NOT DETECTED Final   Serratia marcescens NOT DETECTED NOT DETECTED Final   Haemophilus influenzae NOT DETECTED NOT DETECTED Final   Neisseria meningitidis NOT DETECTED NOT DETECTED Final   Pseudomonas aeruginosa NOT DETECTED NOT DETECTED Final   Stenotrophomonas maltophilia NOT DETECTED NOT DETECTED Final   Candida albicans NOT DETECTED NOT DETECTED Final   Candida auris NOT DETECTED NOT DETECTED Final   Candida glabrata NOT DETECTED NOT DETECTED Final   Candida krusei NOT DETECTED NOT DETECTED Final   Candida parapsilosis NOT DETECTED NOT DETECTED Final   Candida tropicalis NOT DETECTED NOT DETECTED Final   Cryptococcus neoformans/gattii NOT DETECTED NOT DETECTED Final   CTX-M ESBL NOT DETECTED NOT DETECTED Final   Carbapenem resistance IMP NOT DETECTED NOT DETECTED Final   Carbapenem resistance KPC NOT DETECTED NOT DETECTED Final   Carbapenem resistance NDM NOT DETECTED NOT DETECTED Final    Carbapenem resist OXA 48 LIKE NOT  DETECTED NOT DETECTED Final   Carbapenem resistance VIM NOT DETECTED NOT DETECTED Final    Comment: Performed at [facility], [address] [place], [place] [address]  Blood Culture (routine x 2)     Status: Abnormal   Collection Time: 12/15/23  4:30 PM   Specimen: BLOOD  Result Value Ref Range Status   Specimen Description   Final    BLOOD RIGHT ANTECUBITAL Performed at [facility], [address] [place], [place] [address]    Special Requests   Final    BOTTLES DRAWN AEROBIC AND ANAEROBIC Blood Culture results may not be optimal due to an inadequate volume of blood received in culture bottles Performed at [facility], [address] [place], [place] [address]    Culture  Setup Time   Final    GRAM NEGATIVE RODS IN BOTH AEROBIC AND ANAEROBIC BOTTLES CRITICAL VALUE NOTED.  VALUE IS CONSISTENT WITH PREVIOUSLY REPORTED AND CALLED VALUE. Performed at [facility], [address] [place], [place] [address]    Culture (A)  Final    ESCHERICHIA COLI SUSCEPTIBILITIES PERFORMED ON PREVIOUS CULTURE WITHIN THE LAST 5 DAYS. Performed at [facility], [address] [place], [place] [address]    Report Status 12/18/2023 FINAL  Final  Urine Culture (for pregnant, neutropenic or urologic patients or patients with an indwelling urinary catheter)     Status: None   Collection Time: 12/15/23  5:40 PM   Specimen: Urine, Clean Catch  Result Value Ref Range Status   Specimen Description   Final    URINE, CLEAN CATCH Performed at [facility], [address], [place], [place] [address]    Special Requests   Final    NONE Performed at [facility], [address] [place], [place] [address]    Culture   Final    NO GROWTH Performed at [facility], [address] [place], [place] [address]    Report Status 12/16/2023 FINAL  Final  MRSA Next Gen by PCR, Nasal     Status: None    Collection Time: 12/16/23  5:28 PM   Specimen: Nasal Mucosa; Nasal Swab  Result Value Ref Range Status   MRSA by PCR Next Gen NOT DETECTED NOT DETECTED Final    Comment: (NOTE) The GeneXpert MRSA Assay (FDA approved for NASAL specimens only), is one component of a comprehensive MRSA colonization surveillance program. It is not intended to diagnose MRSA infection nor to guide or monitor treatment for MRSA infections. Test performance is not FDA approved in patients less than 67 years old. Performed at [facility], [address], [place], [place] [address]     Studies/Results: No results found.   Assessment/Plan: Sherry [name] is a 68 y.o. 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 Also has decub ulcer which has been present for several months. She has been ill for some time and was at [facility] prior to being dced to SNF  6/6- s/p ERCP, wbc 21 6/9 - wound cx mixed. Seen by surgery and plannig wound vac 6/11 no fevers, wbc decreasing to 13. Has wound vac in place  Recommendations ESBL E coli bacteremia- source from bilary tract and possible liver abscesses. -  Plan 14 day abx course for E coli bacteremia,  Cont ceftriaxone  while inpatient. Can consider ceftazidime at HD (1,1,2 gm q HD) if dced prior to  finishing abx course.  Stop date 6/18    Decub ulcer- chronic- present on admit. Cx mixed but the wound was not overtly infected and we are not covering some of the pathogens and is improving. Has VRE so would need dapto if worsens (PCN allergic- although she cannot tell me what the allergy is). Cont wound care per surgery and wound care team. Has wound vac in place  ID will sign off. Please call with further questions or if the wound worsens.   Eartha Gold   12/22/2023, 12:10 PM

## 2023-12-22 NOTE — Progress Notes (Signed)
 Nutrition Follow-up  DOCUMENTATION CODES:   Non-severe (moderate) malnutrition in context of chronic illness  INTERVENTION:   -Continue feeding assistance with meals -Continue Magic cup TID with meals, each supplement provides 290 kcal and 9 grams of protein  -Continue Renal MVI daily daily -Continue 500 mg vitamin C BID -Continue 200 mg zinc  sulfate daily x 14 days -RD will check for micronutrient deficiencies which may impede wound healing: vitamin A -Continue Nepro Shake po TID, each supplement provides 425 kcal and 19 grams protein   NUTRITION DIAGNOSIS:   Moderate Malnutrition related to chronic illness (ESRD on HD) as evidenced by mild fat depletion, moderate fat depletion, mild muscle depletion, moderate muscle depletion, percent weight loss.  Ongoing  GOAL:   Patient will meet greater than or equal to 90% of their needs  Progressing   MONITOR:   PO intake, Supplement acceptance, Diet advancement  REASON FOR ASSESSMENT:   Malnutrition Screening Tool    ASSESSMENT:   Pt with medical history significant of ESRD on HD (TTS), seizure, chronic biliary duct dilation in the setting of a cholecystectomy which is felt to be physiological and normal per GI consult (this is documented by Dr. Gordon Latus on 02/24/22), stroke, s/p of gastrostomy, hypertension, diastolic CHF, stroke, depression with anxiety, bipolar, anemia,HCV, breast cancer (s/p right mastectomy), polypharmacy, who presents with AMS and fever.  6/6- s/p ERCP- revealed filling defect on the cholangiogram, multiple biliary strictures found (inflammatory), choledocholithiasis found (removed via biliary sphincterotomy and balloon extraction), stent placed in bile duct 6/9- wound vac placed   Reviewed I/O's: -220 ml x 24 hours and +1.4 L since admission  Per I&D notes, pt with ESBL E coli bacteremia likely from biliary tract or possible liver abscess- plan for 14 days of antibiotics.   Pt receiving personal care at  time of visit.   Pt receiving feeding assistance with meals. She refused breakfast this morning, but did eat Magic Cup. Pt has been drinking supplements well. Noted meal completions 50%.   Noted change in weight from 12/20/23 vs 12/21/23. Question accuracy in weight. RD will continue to monitor weight trends. Fluid losses from HD may be contributing to weight changes.   Medications reviewed and include keppra , heparin , and renvela .   Labs reviewed: CBGS: 111 (inpatient orders for glycemic control are none). Vitamin A pending.   Diet Order:   Diet Order             DIET - DYS 1 Room service appropriate? Yes; Fluid consistency: Thin  Diet effective now                   EDUCATION NEEDS:   No education needs have been identified at this time  Skin:  Skin Assessment: Skin Integrity Issues: Skin Integrity Issues:: Wound VAC Wound Vac: stage 3 sacrum  Last BM:  12/22/23 (type 1)  Height:   Ht Readings from Last 1 Encounters:  12/15/23 5' 2 (1.575 m)    Weight:   Wt Readings from Last 1 Encounters:  12/21/23 51.3 kg    Ideal Body Weight:  50 kg  BMI:  Body mass index is 20.69 kg/m.  Estimated Nutritional Needs:   Kcal:  1550-1750  Protein:  85-100 grams  Fluid:  1000 ml + UOP    Herschel Lords, RD, LDN, CDCES Registered Dietitian III Certified Diabetes Care and Education Specialist If unable to reach this RD, please use RD Inpatient group chat on secure chat between hours of 8am-4 pm daily

## 2023-12-22 NOTE — Progress Notes (Signed)
 [facility]  ROUNDING NOTE   Subjective:   Patient seen laying in bed No family present Patient refuses breakfast tray however agreeable to eat Magic cup Patient seen later with family at bedside More alert  Objective:  Vital signs in last 24 hours:  Temp:  [97.9 F (36.6 C)-98.3 F (36.8 C)] 98.1 F (36.7 C) (06/11 0723) Pulse Rate:  [60-74] 66 (06/11 0723) Resp:  [12-25] 15 (06/11 0723) BP: (107-146)/(52-92) 138/67 (06/11 0723) SpO2:  [96 %-100 %] 100 % (06/11 0723) Weight:  [51.3 kg] 51.3 kg (06/10 1337)  Weight change: 0.044 kg Filed Weights   12/20/23 0616 12/21/23 0500 12/21/23 1337  Weight: 56.8 kg 51.3 kg 51.3 kg    Intake/Output: I/O last 3 completed shifts: In: 983.3 [P.O.:270; I.V.:13.3; Other:500; IV Piggyback:200] Out: 1025 [Drains:25; Other:1000]   Intake/Output this shift:  Total I/O In: 100 [IV Piggyback:100] Out: -   Physical Exam: General: NAD  Head: Normocephalic, atraumatic. Moist oral mucosal membranes  Eyes: Anicteric  Lungs:  Clear to auscultation  Heart: Regular rate and rhythm  Abdomen:  Soft, nontender  Extremities:  No peripheral edema.  Neurologic: Awake, alert  Skin: Warm,dry, large sacral wound (NPWV)  Access: Rt internal jugular permcath    Basic Metabolic Panel: Recent Labs  Lab 12/16/23 0532 12/17/23 0610 12/18/23 0442 12/20/23 0114 12/21/23 0800  NA 139 138 139 137 136  K 3.6 3.5 3.7 2.9* 4.1  CL 102 100 99 95* 96*  CO2 27 27 27 28 26   GLUCOSE 110* 83 92 86 93  BUN 53* 31* 51* 41* 53*  CREATININE 4.13* 2.53* 3.38* 2.92* 3.76*  CALCIUM  9.0 8.7* 8.8* 8.8* 8.8*    Liver Function Tests: Recent Labs  Lab 12/16/23 0532 12/17/23 0610 12/18/23 0442 12/20/23 0114 12/21/23 0800  AST 184* 137* 128* 91* 75*  ALT 61* 44 38 28 21  ALKPHOS 974* 829* 775* 714* 717*  BILITOT 1.4* 1.3* 0.9 0.8 0.8  PROT 6.9 6.5 6.7 7.1 6.8  ALBUMIN 2.1* 2.0* 2.0* 2.2* 2.2*   No results for input(s): LIPASE, AMYLASE  in the last 168 hours. Recent Labs  Lab 12/15/23 1819  AMMONIA <13    CBC: Recent Labs  Lab 12/15/23 1819 12/16/23 0532 12/17/23 0610 12/18/23 0442 12/20/23 0114 12/21/23 0800  WBC 18.2* 21.3* 21.0* 16.2* 13.2* 13.0*  NEUTROABS 15.8*  --   --  14.0* 10.9*  --   HGB 7.7* 6.7* 7.9* 8.2* 8.7* 9.4*  HCT 24.8* 22.3* 24.8* 25.5* 28.5* 30.2*  MCV 92.5 93.7 89.9 88.9 90.5 89.1  PLT 414* 415* 438* 407* 439* 436*    Cardiac Enzymes: Recent Labs  Lab 12/15/23 1819  CKTOTAL 11*    BNP: Invalid input(s): POCBNP  CBG: Recent Labs  Lab 12/16/23 0917  GLUCAP 111*    Microbiology: Results for orders placed or performed during the hospital encounter of 12/15/23  Resp panel by RT-PCR (RSV, Flu A&B, Covid) Anterior Nasal Swab     Status: None   Collection Time: 12/15/23  4:11 PM   Specimen: Anterior Nasal Swab  Result Value Ref Range Status   SARS Coronavirus 2 by RT PCR NEGATIVE NEGATIVE Final    Comment: (NOTE) SARS-CoV-2 target nucleic acids are NOT DETECTED.  The SARS-CoV-2 RNA is generally detectable in upper respiratory specimens during the acute phase of infection. The lowest concentration of SARS-CoV-2 viral copies this assay can detect is 138 copies/mL. A negative result does not preclude SARS-Cov-2 infection and should not be used  as the sole basis for treatment or other patient management decisions. A negative result may occur with  improper specimen collection/handling, submission of specimen other than nasopharyngeal swab, presence of viral mutation(s) within the areas targeted by this assay, and inadequate number of viral copies(<138 copies/mL). A negative result must be combined with clinical observations, patient history, and epidemiological information. The expected result is Negative.  Fact Sheet for Patients:  BloggerCourse.com  Fact Sheet for Healthcare Providers:  SeriousBroker.it  This test is  no t yet approved or cleared by the [place]  FDA and  has been authorized for detection and/or diagnosis of SARS-CoV-2 by FDA under an Emergency Use Authorization (EUA). This EUA will remain  in effect (meaning this test can be used) for the duration of the COVID-19 declaration under Section 564(b)(1) of the Act, 21 U.S.C.section 360bbb-3(b)(1), unless the authorization is terminated  or revoked sooner.       Influenza A by PCR NEGATIVE NEGATIVE Final   Influenza B by PCR NEGATIVE NEGATIVE Final    Comment: (NOTE) The Xpert Xpress SARS-CoV-2/FLU/RSV plus assay is intended as an aid in the diagnosis of influenza from Nasopharyngeal swab specimens and should not be used as a sole basis for treatment. Nasal washings and aspirates are unacceptable for Xpert Xpress SARS-CoV-2/FLU/RSV testing.  Fact Sheet for Patients: BloggerCourse.com  Fact Sheet for Healthcare Providers: SeriousBroker.it  This test is not yet approved or cleared by the [place]  FDA and has been authorized for detection and/or diagnosis of SARS-CoV-2 by FDA under an Emergency Use Authorization (EUA). This EUA will remain in effect (meaning this test can be used) for the duration of the COVID-19 declaration under Section 564(b)(1) of the Act, 21 U.S.C. section 360bbb-3(b)(1), unless the authorization is terminated or revoked.     Resp Syncytial Virus by PCR NEGATIVE NEGATIVE Final    Comment: (NOTE) Fact Sheet for Patients: BloggerCourse.com  Fact Sheet for Healthcare Providers: SeriousBroker.it  This test is not yet approved or cleared by the [place]  FDA and has been authorized for detection and/or diagnosis of SARS-CoV-2 by FDA under an Emergency Use Authorization (EUA). This EUA will remain in effect (meaning this test can be used) for the duration of the COVID-19 declaration under Section  564(b)(1) of the Act, 21 U.S.C. section 360bbb-3(b)(1), unless the authorization is terminated or revoked.  Performed at [facility], [address] [place], [place] [address]   Blood Culture (routine x 2)     Status: Abnormal   Collection Time: 12/15/23  4:12 PM   Specimen: BLOOD  Result Value Ref Range Status   Specimen Description   Final    BLOOD RIGHT ANTECUBITAL Performed at [facility], [address] [place], [place] [address]    Special Requests   Final    BOTTLES DRAWN AEROBIC AND ANAEROBIC Blood Culture results may not be optimal due to an inadequate volume of blood received in culture bottles Performed at [facility], [address], [place], [place] [address]    Culture  Setup Time   Final    GRAM NEGATIVE RODS IN BOTH AEROBIC AND ANAEROBIC BOTTLES Organism ID to follow CRITICAL RESULT CALLED TO, READ BACK BY AND VERIFIED WITHLatina Pol AT 2130 12/16/23 JG Performed at [facility], [address] [place], [place] [address]    Culture ESCHERICHIA COLI (A)  Final   Report Status 12/18/2023 FINAL  Final   Organism ID, Bacteria ESCHERICHIA COLI  Final   Organism  ID, Bacteria ESCHERICHIA COLI  Final      Susceptibility   Escherichia coli - KIRBY BAUER*    CEFAZOLIN  RESISTANT Resistant    Escherichia coli - MIC*    AMPICILLIN >=32 RESISTANT Resistant     CEFEPIME  <=0.12 SENSITIVE Sensitive     CEFTAZIDIME <=1 SENSITIVE Sensitive     CEFTRIAXONE  <=0.25 SENSITIVE Sensitive     CIPROFLOXACIN >=4 RESISTANT Resistant     GENTAMICIN <=1 SENSITIVE Sensitive     IMIPENEM <=0.25 SENSITIVE Sensitive     TRIMETH /SULFA  <=20 SENSITIVE Sensitive     AMPICILLIN/SULBACTAM >=32 RESISTANT Resistant     PIP/TAZO <=4 SENSITIVE Sensitive ug/mL    * ESCHERICHIA COLI    ESCHERICHIA COLI  Aerobic/Anaerobic Culture w Gram Stain (surgical/deep wound)     Status: None   Collection Time: 12/15/23  4:12 PM   Specimen: Wound  Result Value  Ref Range Status   Specimen Description   Final    WOUND Performed at [facility], [address], [place], [place] [address]    Special Requests   Final    SACRAL Performed at [facility], [address] [place], [place] [address]    Gram Stain NO WBC SEEN RARE GRAM NEGATIVE RODS   Final   Culture   Final    ABUNDANT KLEBSIELLA PNEUMONIAE FEW ESCHERICHIA COLI FEW BACTEROIDES FRAGILIS BETA LACTAMASE POSITIVE FEW ENTEROCOCCUS FAECALIS FEW CORYNEBACTERIUM STRIATUM Standardized susceptibility testing for this organism is not available. Performed at [facility], [address] [place], [place] [address]    Report Status 12/19/2023 FINAL  Final   Organism ID, Bacteria KLEBSIELLA PNEUMONIAE  Final   Organism ID, Bacteria ESCHERICHIA COLI  Final   Organism ID, Bacteria ENTEROCOCCUS FAECALIS  Final      Susceptibility   Escherichia coli - MIC*    AMPICILLIN >=32 RESISTANT Resistant     CEFEPIME  <=0.12 SENSITIVE Sensitive     CEFTAZIDIME <=1 SENSITIVE Sensitive     CEFTRIAXONE  <=0.25 SENSITIVE Sensitive     CIPROFLOXACIN >=4 RESISTANT Resistant     GENTAMICIN <=1 SENSITIVE Sensitive     IMIPENEM <=0.25 SENSITIVE Sensitive     TRIMETH /SULFA  <=20 SENSITIVE Sensitive     AMPICILLIN/SULBACTAM >=32 RESISTANT Resistant     PIP/TAZO <=4 SENSITIVE Sensitive ug/mL    * FEW ESCHERICHIA COLI   Enterococcus faecalis - MIC*    AMPICILLIN <=2 SENSITIVE Sensitive     VANCOMYCIN  >=32 RESISTANT Resistant     GENTAMICIN SYNERGY SENSITIVE Sensitive     * FEW ENTEROCOCCUS FAECALIS   Klebsiella pneumoniae - MIC*    AMPICILLIN RESISTANT Resistant     CEFEPIME  <=0.12 SENSITIVE Sensitive     CEFTAZIDIME <=1 SENSITIVE Sensitive     CEFTRIAXONE  <=0.25 SENSITIVE Sensitive     CIPROFLOXACIN <=0.25 SENSITIVE Sensitive     GENTAMICIN <=1 SENSITIVE Sensitive     IMIPENEM <=0.25 SENSITIVE Sensitive     TRIMETH /SULFA  <=20 SENSITIVE Sensitive     AMPICILLIN/SULBACTAM <=2  SENSITIVE Sensitive     PIP/TAZO <=4 SENSITIVE Sensitive ug/mL    * ABUNDANT KLEBSIELLA PNEUMONIAE  Blood Culture ID Panel (Reflexed)     Status: Abnormal   Collection Time: 12/15/23  4:12 PM  Result Value Ref Range Status   Enterococcus faecalis NOT DETECTED NOT DETECTED Final   Enterococcus Faecium NOT DETECTED NOT DETECTED Final   Listeria monocytogenes NOT DETECTED NOT DETECTED Final   Staphylococcus species NOT DETECTED NOT DETECTED Final   Staphylococcus aureus (BCID) NOT  DETECTED NOT DETECTED Final   Staphylococcus epidermidis NOT DETECTED NOT DETECTED Final   Staphylococcus lugdunensis NOT DETECTED NOT DETECTED Final   Streptococcus species NOT DETECTED NOT DETECTED Final   Streptococcus agalactiae NOT DETECTED NOT DETECTED Final   Streptococcus pneumoniae NOT DETECTED NOT DETECTED Final   Streptococcus pyogenes NOT DETECTED NOT DETECTED Final   A.calcoaceticus-baumannii NOT DETECTED NOT DETECTED Final   Bacteroides fragilis NOT DETECTED NOT DETECTED Final   Enterobacterales DETECTED (A) NOT DETECTED Final    Comment: Enterobacterales represent a large order of gram negative bacteria, not a single organism. CRITICAL RESULT CALLED TO, READ BACK BY AND VERIFIED WITH:  JASON ROBINS AT 1478 12/16/23 JG    Enterobacter cloacae complex NOT DETECTED NOT DETECTED Final   Escherichia coli DETECTED (A) NOT DETECTED Final    Comment: CRITICAL RESULT CALLED TO, READ BACK BY AND VERIFIED WITH:  JASON ROBINS AT 0529 12/16/23 JG    Klebsiella aerogenes NOT DETECTED NOT DETECTED Final   Klebsiella oxytoca NOT DETECTED NOT DETECTED Final   Klebsiella pneumoniae NOT DETECTED NOT DETECTED Final   Proteus species NOT DETECTED NOT DETECTED Final   Salmonella species NOT DETECTED NOT DETECTED Final   Serratia marcescens NOT DETECTED NOT DETECTED Final   Haemophilus influenzae NOT DETECTED NOT DETECTED Final   Neisseria meningitidis NOT DETECTED NOT DETECTED Final   Pseudomonas aeruginosa NOT  DETECTED NOT DETECTED Final   Stenotrophomonas maltophilia NOT DETECTED NOT DETECTED Final   Candida albicans NOT DETECTED NOT DETECTED Final   Candida auris NOT DETECTED NOT DETECTED Final   Candida glabrata NOT DETECTED NOT DETECTED Final   Candida krusei NOT DETECTED NOT DETECTED Final   Candida parapsilosis NOT DETECTED NOT DETECTED Final   Candida tropicalis NOT DETECTED NOT DETECTED Final   Cryptococcus neoformans/gattii NOT DETECTED NOT DETECTED Final   CTX-M ESBL NOT DETECTED NOT DETECTED Final   Carbapenem resistance IMP NOT DETECTED NOT DETECTED Final   Carbapenem resistance KPC NOT DETECTED NOT DETECTED Final   Carbapenem resistance NDM NOT DETECTED NOT DETECTED Final   Carbapenem resist OXA 48 LIKE NOT DETECTED NOT DETECTED Final   Carbapenem resistance VIM NOT DETECTED NOT DETECTED Final    Comment: Performed at [facility], [address] [place], [place] [address]  Blood Culture (routine x 2)     Status: Abnormal   Collection Time: 12/15/23  4:30 PM   Specimen: BLOOD  Result Value Ref Range Status   Specimen Description   Final    BLOOD RIGHT ANTECUBITAL Performed at [facility], [address] [place], [place] [address]    Special Requests   Final    BOTTLES DRAWN AEROBIC AND ANAEROBIC Blood Culture results may not be optimal due to an inadequate volume of blood received in culture bottles Performed at [facility], [address] [place], [place] [address]    Culture  Setup Time   Final    GRAM NEGATIVE RODS IN BOTH AEROBIC AND ANAEROBIC BOTTLES CRITICAL VALUE NOTED.  VALUE IS CONSISTENT WITH PREVIOUSLY REPORTED AND CALLED VALUE. Performed at [facility], [address] [place], [place] [address]    Culture (A)  Final    ESCHERICHIA COLI SUSCEPTIBILITIES PERFORMED ON PREVIOUS CULTURE WITHIN THE LAST 5 DAYS. Performed at [facility], [address], [place], [place] [address]    Report Status 12/18/2023 FINAL   Final  Urine Culture (for pregnant, neutropenic or urologic patients or patients with an indwelling urinary catheter)  Status: None   Collection Time: 12/15/23  5:40 PM   Specimen: Urine, Clean Catch  Result Value Ref Range Status   Specimen Description   Final    URINE, CLEAN CATCH Performed at [facility], [address] [place], [place] [address]    Special Requests   Final    NONE Performed at [facility], [address], [place], [place] [address]    Culture   Final    NO GROWTH Performed at [facility], [address], [place], [place] [address]    Report Status 12/16/2023 FINAL  Final  MRSA Next Gen by PCR, Nasal     Status: None   Collection Time: 12/16/23  5:28 PM   Specimen: Nasal Mucosa; Nasal Swab  Result Value Ref Range Status   MRSA by PCR Next Gen NOT DETECTED NOT DETECTED Final    Comment: (NOTE) The GeneXpert MRSA Assay (FDA approved for NASAL specimens only), is one component of a comprehensive MRSA colonization surveillance program. It is not intended to diagnose MRSA infection nor to guide or monitor treatment for MRSA infections. Test performance is not FDA approved in patients less than 10 years old. Performed at [facility], [address] [place], [place] [address]     Coagulation Studies: No results for input(s): LABPROT, INR in the last 72 hours.   Urinalysis: No results for input(s): COLORURINE, LABSPEC, PHURINE, GLUCOSEU, HGBUR, BILIRUBINUR, KETONESUR, PROTEINUR, UROBILINOGEN, NITRITE, LEUKOCYTESUR in the last 72 hours.  Invalid input(s): APPERANCEUR     Imaging: No results found.    Medications:    cefTRIAXone  (ROCEPHIN )  IV Stopped (12/22/23 0711)    vitamin C  500 mg Per Tube BID   aspirin   81 mg Per Tube Daily   Chlorhexidine  Gluconate Cloth  6 each Topical Q0600   cloBAZam   5 mg Per Tube Q12H   feeding supplement  237 mL Oral BID BM   heparin   5,000  Units Subcutaneous Q8H   lacosamide   200 mg Per Tube BID   levETIRAcetam   500 mg Per Tube BID   multivitamin  1 tablet Per Tube QHS   mupirocin  ointment  1 Application Topical BID   nicotine   21 mg Transdermal Daily   omeprazole   20 mg Per Tube Daily   sertraline   25 mg Per Tube Daily   sevelamer  carbonate  800 mg Per Tube TID WC   zinc  sulfate (50mg  elemental zinc )  220 mg Per Tube Daily   hydrALAZINE , hydrOXYzine , ibuprofen , LORazepam , ondansetron  (ZOFRAN ) IV, oxyCODONE , senna-docusate  Assessment/ Plan:  Sherry [name] is a 68 y.o.  female with past medical conditions including seizures, chronic biliary duct dilation, CHF, hypertension, stroke, diastolic heart failure, depression anxiety, bipolar, anemia, breast cancer status post right mastectomy, polypharmacy, end-stage renal disease on hemodialysis, who was admitted to [facility] on 12/15/2023 for  Elevated LFTs [R79.89] Liver lesion [K76.9] Dilation of biliary tract [K83.8] Sepsis (HCC) [A41.9] Urinary tract infection without hematuria, site unspecified [N39.0] Altered mental status, unspecified altered mental status type [R41.82] Sepsis, due to unspecified organism, unspecified whether acute organ dysfunction present (HCC) [A41.9]   End stage renal disease on hemodialysis.  Patient received dialysis treatment yesterday, UF 1 L achieved.  Next treatment scheduled for Thursday.  2. Sepsis secondary to UTI and sacral wound infection. Blood culture positive for E coli and Enterobacterales. Prescribed Vancomycin  and metronidazole  during this admission. Now receiving Ceftriaxone .  If discharged prior to completion, patient will need to continue  IV antibiotics with dialysis, ceftazidime 1/1/2 g with each dialysis treatment.  Awaiting official end date.  Verified with outpatient clinic the availability of ceftazidime.  3. Chronic biliary dilation with suspected cholangitis and liver abscess. CT abdomen and pelvis with contrast shows diffuse  biliary duct dilatation with hypodense nodular areas in the liver concerning for intrahepatic abscesses versus metastatic disease. ERCP completed on 6/6 with biliary sphincterotomy.   4. Anemia of chronic kidney disease Lab Results  Component Value Date   HGB 9.4 (L) 12/21/2023     Due to breast cancer history, will avoid ESA's.  Hemoglobin currently acceptable for this patient.  5. Secondary Hyperparathyroidism: with outpatient labs: PTH 354 on 09/01/23.    Lab Results  Component Value Date   CALCIUM  8.8 (L) 12/21/2023   CAION 1.13 (L) 05/25/2023   PHOS 6.2 (H) 05/26/2023  Calcium  remains acceptable. Patient prescribed auryxia and sevelamer  outpatient. Remains on sevelamer .   6. Hypertension with chronic kidney disease. Home regimen includes carvedilol , and hydralazine . Held at this time. Blood pressure acceptable, 138/67.   LOS: 7 Wilbern Pennypacker 6/11/202512:07 PM

## 2023-12-22 NOTE — Progress Notes (Signed)
 Progress Note   Patient: Sherry [name] ZOX:[id number] DOB: 06-24-1956 DOA: 12/15/2023     7 DOS: the patient was seen and examined on 12/22/2023   Brief hospital course: HPI from admission: Sherry [name] is a 68 y.o. female with medical history significant for ESRD on HD (on MWF schedule), seizure, chronic biliary duct dilation in the setting of a cholecystectomy which is felt to be physiological and normal per GI consult (this is documented by Dr. Gordon Latus on 02/24/22), chronic hepatitis C, opioid use disorder, stroke, s/p of gastrostomy, hypertension, diastolic CHF, stroke, depression with anxiety, bipolar, anemia,HCV, breast cancer (s/p right mastectomy), polypharmacy.  She was previously hospitalized at [facility] in February 2025 for E. coli pneumonia and bacteremia, cerebellar stroke and tonic-clonic seizures in the setting of severe PRES that required intubation and subsequent tracheostomy and PEG tube placement.  She presented to the hospital because of fever and altered mental status.  Reportedly, she was noted to be confused and she spiked a temperature with fever of 102.4 F on the day of admission.     ED Course: pt was found to have WBC 18.2, lactic acid of 1.4 --> 1.1, potassium 3.6, positive UA (turbid appearance, large amount of leukocyte, many bacteria, WBC > 50, squamous cell 11-20), negative PCR for COVID, flu and RSV, ammonia level<13, troponin 25 --> 22, abnormal liver function (ALP 1035, AST 214, ALT 67, total bilirubin 0.9). Blood pressure 150/72 --> 119/58, heart rate 95, RR 21, oxygen saturation 94% on room air.  Chest x-ray negative.  CT of head negative.  Patient is admitted to PCU as inpatient.  Dr. Zelda Hickman of renal is consulted.   Patient was admitted for further evaluation and management of sepsis due to E. Coli bacteremia, suspected UTI, possible cholangitis based on imaging, and worsening sacral wound as outlined in detail below.    Assessment and Plan:  Severe sepsis -  POA on admisison with fever, leukocytosis, tachycardia, tachypnea and lactic acidosis consistent with organ dysfunction E. coli bacteremia / Suspected acute cholangitis:  4 out of 4 blood cultures positive for E. coli.   No growth on urine culture Sacral wound culture showed multiple bacteria (Klebsiella pneumoniae, E. coli, Bacteroides fragilis, Enterococcus faecalis and Corynebacterium striatum). --Infectious disease consulted --Recommending total of 14 days of IV antibiotics - Rocephin  while admitted, transitioning to ceftazidime at dialysis after discharge     Chronic biliary dilatation from previous cholecystectomy Choledocholithiasis Suspected acute cholangitis Suspected multiple peripheral liver abscesses on MRCP Chronically elevated liver enzymes Chronic hepatitis C:  S/p ERCP on 12/2023 -- A filling defect was seen on the cholangiogram.  Multiple biliary strictures were found in the entire biliary tree.  Strictures were inflammatory.  Choledocholithiasis was found.  Complete removal was accomplished by biliary sphincterotomy and balloon extraction.  A biliary sphincterectomy was performed.  The biliary tree was swept and nothing was found.  1 plastic stent was placed into the common bile duct. --Trend LFT's and CBC --Monitor clinically - abdominal exam, mental status, fever curve     Sacral decubitus ulcer stage 3: Present on admission.   According to [facility], patient's niece, wound was contaminated with fecal material at facility for about a week prior to admission. --General surgery and WOC consulted --Wound vac placed 12/20/2023 --Continue frequent repositioning, off-loading pressure areas --WOC following for vac changes twice weekly - Mon/Thur    Acute metabolic encephalopathy:  Presented with lethargy, not following commands, only responsive to painful stimuli, per H&P.  Baseline: alert and  oriented x 3  Likely due to sepsis/infection.   CT head did not show any acute  stroke. Resolved. --Delirium precautions   Hypokalemia: resolved K 2.9 on 6/9 was replaced.  K 4.1 today --Monitor BMP, replace K PRN --Check Mg level   Acute on chronic anemia: H&H stable.   Hemoglobin up from 6.7 (transfused 1 unit pRBC's 12/16/23) >> 7.9 >> ...9.4. --Monitor H&H and transfuse as needed if Hbg < 7.   ESRD on TTS schedule:  --Nephrology following for dialysis  Chronic diastolic CHF: Compensated --Monitor volume status --Volume mgmt by dialysis   Seizure disorder: Continue clobazam , Vimpat  and Keppra .   History of stroke, dysphagia with gastrostomy tube in place Patient is on dysphagia 1 diet (confirmed by [place], niece).   UTI Ruled Out        Subjective: Pt seen with sister at bedside this AM.  Pt reports pain at the wound vac site, otherwise denies acute complaints.   Physical Exam: Vitals:   12/21/23 1956 12/22/23 0518 12/22/23 0723 12/22/23 1315  BP: (!) 139/92 (!) 143/65 138/67   Pulse: 74 66 66   Resp: 18 18 15 16   Temp: 97.9 F (36.6 C) 98.1 F (36.7 C) 98.1 F (36.7 C)   TempSrc:      SpO2: 100% 100% 100%   Weight:      Height:       General exam: awake, alert, no acute distress HEENT: TD-like movements of mouth continuously, moist mucus membranes, hearing grossly normal  Respiratory system: on room air, normal respiratory effort. Cardiovascular system: RRR, no pedal edema.   Gastrointestinal system: soft, NT, ND Central nervous system: A&O x2+. no gross focal neurologic deficits, normal speech Skin: sacral wound vac in place Psychiatry: normal mood, congruent affect   Data Reviewed:  Notable labs - Cl 96 BUN 53 Cr 3.76 Alk phos 717 Albumin 2.2 AST improved 75  WBC 13.0 Hbg improved 9.4 Platelets 436k   Family Communication: sister at bedside  Disposition: Status is: Inpatient Remains inpatient appropriate because: needs close monitoring for ongoing clinical improvement on IV antibiotics prior to d/c.     Planned  Discharge Destination: Skilled nursing facility    Time spent: 45 minutes  Author: Montey Apa, DO 12/22/2023 1:56 PM  For on call review [email or web address].

## 2023-12-23 DIAGNOSIS — A419 Sepsis, unspecified organism: Secondary | ICD-10-CM | POA: Diagnosis not present

## 2023-12-23 DIAGNOSIS — R6520 Severe sepsis without septic shock: Secondary | ICD-10-CM | POA: Diagnosis not present

## 2023-12-23 LAB — BASIC METABOLIC PANEL WITH GFR
Anion gap: 12 (ref 5–15)
BUN: 33 mg/dL — ABNORMAL HIGH (ref 8–23)
CO2: 29 mmol/L (ref 22–32)
Calcium: 8.8 mg/dL — ABNORMAL LOW (ref 8.9–10.3)
Chloride: 96 mmol/L — ABNORMAL LOW (ref 98–111)
Creatinine, Ser: 3.09 mg/dL — ABNORMAL HIGH (ref 0.44–1.00)
GFR, Estimated: 16 mL/min — ABNORMAL LOW (ref >60)
Glucose, Bld: 74 mg/dL (ref 70–99)
Potassium: 3.9 mmol/L (ref 3.5–5.1)
Sodium: 137 mmol/L (ref 135–145)

## 2023-12-23 LAB — CBC
HCT: 28.2 % — ABNORMAL LOW (ref 36.0–46.0)
Hemoglobin: 8.7 g/dL — ABNORMAL LOW (ref 12.0–15.0)
MCH: 28.2 pg (ref 26.0–34.0)
MCHC: 30.9 g/dL (ref 30.0–36.0)
MCV: 91.6 fL (ref 80.0–100.0)
Platelets: 391 10*3/uL (ref 150–400)
RBC: 3.08 MIL/uL — ABNORMAL LOW (ref 3.87–5.11)
RDW: 16.4 % — ABNORMAL HIGH (ref 11.5–15.5)
WBC: 10.3 10*3/uL (ref 4.0–10.5)
nRBC: 0 % (ref 0.0–0.2)

## 2023-12-23 LAB — PHOSPHORUS: Phosphorus: 5.1 mg/dL — ABNORMAL HIGH (ref 2.5–4.6)

## 2023-12-23 LAB — MAGNESIUM: Magnesium: 2.4 mg/dL (ref 1.7–2.4)

## 2023-12-23 LAB — GLUCOSE, CAPILLARY: Glucose-Capillary: 90 mg/dL (ref 70–99)

## 2023-12-23 LAB — VITAMIN A: Vitamin A (Retinoic Acid): 29.1 ug/dL (ref 22.0–69.5)

## 2023-12-23 NOTE — Progress Notes (Signed)
  Received patient in bed to unit.   Informed consent signed and in chart.    TX duration: 3.5hrs     Transported back to floor  Hand-off given to patient's nurse. Pt c/o 8/10 back pain  primary nurse notified    Access used: R HD Catheter  Access issues: none   Total UF removed: 1.5L Medication(s) given: none  Post HD VS: wnl:      Bettye Bruins LPN [facility]

## 2023-12-23 NOTE — Progress Notes (Signed)
 [facility]  ROUNDING NOTE   Subjective:   Patient seen and evaluated during dialysis   HEMODIALYSIS FLOWSHEET:  Blood Flow Rate (mL/min): 399 mL/min Arterial Pressure (mmHg): -220.59 mmHg Venous Pressure (mmHg): 195.14 mmHg TMP (mmHg): -0.4 mmHg Ultrafiltration Rate (mL/min): 624 mL/min Dialysate Flow Rate (mL/min): 300 ml/min Dialysis Fluid Bolus: Normal Saline Bolus Amount (mL): 300 mL  Tolerating treatment well Denies pain  Objective:  Vital signs in last 24 hours:  Temp:  [98 F (36.7 C)-98.4 F (36.9 C)] 98.4 F (36.9 C) (06/12 0836) Pulse Rate:  [62-78] 78 (06/12 1100) Resp:  [11-16] 11 (06/12 1100) BP: (122-156)/(68-94) 133/79 (06/12 1100) SpO2:  [100 %] 100 % (06/12 1100) Weight:  [54.4 kg] 54.4 kg (06/12 0500)  Weight change: 3.1 kg Filed Weights   12/21/23 0500 12/21/23 1337 12/23/23 0500  Weight: 51.3 kg 51.3 kg 54.4 kg    Intake/Output: I/O last 3 completed shifts: In: 610 [P.O.:120; Other:390; IV Piggyback:100] Out: 100 [Drains:100]   Intake/Output this shift:  No intake/output data recorded.  Physical Exam: General: NAD  Head: Normocephalic, atraumatic. Moist oral mucosal membranes  Eyes: Anicteric  Lungs:  Clear to auscultation  Heart: Regular rate and rhythm  Abdomen:  Soft, nontender  Extremities:  No peripheral edema.  Neurologic: Awake, alert  Skin: Warm,dry, large sacral wound (NPWV)  Access: Rt internal jugular permcath    Basic Metabolic Panel: Recent Labs  Lab 12/17/23 0610 12/18/23 0442 12/20/23 0114 12/21/23 0800 12/23/23 0405  NA 138 139 137 136 137  K 3.5 3.7 2.9* 4.1 3.9  CL 100 99 95* 96* 96*  CO2 27 27 28 26 29   GLUCOSE 83 92 86 93 74  BUN 31* 51* 41* 53* 33*  CREATININE 2.53* 3.38* 2.92* 3.76* 3.09*  CALCIUM  8.7* 8.8* 8.8* 8.8* 8.8*  MG  --   --   --   --  2.4    Liver Function Tests: Recent Labs  Lab 12/17/23 0610 12/18/23 0442 12/20/23 0114 12/21/23 0800  AST 137* 128* 91* 75*  ALT  44 38 28 21  ALKPHOS 829* 775* 714* 717*  BILITOT 1.3* 0.9 0.8 0.8  PROT 6.5 6.7 7.1 6.8  ALBUMIN 2.0* 2.0* 2.2* 2.2*   No results for input(s): LIPASE, AMYLASE in the last 168 hours. No results for input(s): AMMONIA in the last 168 hours.   CBC: Recent Labs  Lab 12/17/23 0610 12/18/23 0442 12/20/23 0114 12/21/23 0800 12/23/23 0405  WBC 21.0* 16.2* 13.2* 13.0* 10.3  NEUTROABS  --  14.0* 10.9*  --   --   HGB 7.9* 8.2* 8.7* 9.4* 8.7*  HCT 24.8* 25.5* 28.5* 30.2* 28.2*  MCV 89.9 88.9 90.5 89.1 91.6  PLT 438* 407* 439* 436* 391    Cardiac Enzymes: No results for input(s): CKTOTAL, CKMB, CKMBINDEX, TROPONINI in the last 168 hours.   BNP: Invalid input(s): POCBNP  CBG: No results for input(s): GLUCAP in the last 168 hours.   Microbiology: Results for orders placed or performed during the hospital encounter of 12/15/23  Resp panel by RT-PCR (RSV, Flu A&B, Covid) Anterior Nasal Swab     Status: None   Collection Time: 12/15/23  4:11 PM   Specimen: Anterior Nasal Swab  Result Value Ref Range Status   SARS Coronavirus 2 by RT PCR NEGATIVE NEGATIVE Final    Comment: (NOTE) SARS-CoV-2 target nucleic acids are NOT DETECTED.  The SARS-CoV-2 RNA is generally detectable in upper respiratory specimens during the acute phase of infection. The lowest  concentration of SARS-CoV-2 viral copies this assay can detect is 138 copies/mL. A negative result does not preclude SARS-Cov-2 infection and should not be used as the sole basis for treatment or other patient management decisions. A negative result may occur with  improper specimen collection/handling, submission of specimen other than nasopharyngeal swab, presence of viral mutation(s) within the areas targeted by this assay, and inadequate number of viral copies(<138 copies/mL). A negative result must be combined with clinical observations, patient history, and epidemiological information. The expected result is  Negative.  Fact Sheet for Patients:  BloggerCourse.com  Fact Sheet for Healthcare Providers:  SeriousBroker.it  This test is no t yet approved or cleared by the [place]  FDA and  has been authorized for detection and/or diagnosis of SARS-CoV-2 by FDA under an Emergency Use Authorization (EUA). This EUA will remain  in effect (meaning this test can be used) for the duration of the COVID-19 declaration under Section 564(b)(1) of the Act, 21 U.S.C.section 360bbb-3(b)(1), unless the authorization is terminated  or revoked sooner.       Influenza A by PCR NEGATIVE NEGATIVE Final   Influenza B by PCR NEGATIVE NEGATIVE Final    Comment: (NOTE) The Xpert Xpress SARS-CoV-2/FLU/RSV plus assay is intended as an aid in the diagnosis of influenza from Nasopharyngeal swab specimens and should not be used as a sole basis for treatment. Nasal washings and aspirates are unacceptable for Xpert Xpress SARS-CoV-2/FLU/RSV testing.  Fact Sheet for Patients: BloggerCourse.com  Fact Sheet for Healthcare Providers: SeriousBroker.it  This test is not yet approved or cleared by the [place]  FDA and has been authorized for detection and/or diagnosis of SARS-CoV-2 by FDA under an Emergency Use Authorization (EUA). This EUA will remain in effect (meaning this test can be used) for the duration of the COVID-19 declaration under Section 564(b)(1) of the Act, 21 U.S.C. section 360bbb-3(b)(1), unless the authorization is terminated or revoked.     Resp Syncytial Virus by PCR NEGATIVE NEGATIVE Final    Comment: (NOTE) Fact Sheet for Patients: BloggerCourse.com  Fact Sheet for Healthcare Providers: SeriousBroker.it  This test is not yet approved or cleared by the [place]  FDA and has been authorized for detection and/or diagnosis of  SARS-CoV-2 by FDA under an Emergency Use Authorization (EUA). This EUA will remain in effect (meaning this test can be used) for the duration of the COVID-19 declaration under Section 564(b)(1) of the Act, 21 U.S.C. section 360bbb-3(b)(1), unless the authorization is terminated or revoked.  Performed at [facility], [address], [place], [place] [address]   Blood Culture (routine x 2)     Status: Abnormal   Collection Time: 12/15/23  4:12 PM   Specimen: BLOOD  Result Value Ref Range Status   Specimen Description   Final    BLOOD RIGHT ANTECUBITAL Performed at [facility], [address] [place], [place] [address]    Special Requests   Final    BOTTLES DRAWN AEROBIC AND ANAEROBIC Blood Culture results may not be optimal due to an inadequate volume of blood received in culture bottles Performed at [facility], [address] [place], [place] [address]    Culture  Setup Time   Final    GRAM NEGATIVE RODS IN BOTH AEROBIC AND ANAEROBIC BOTTLES Organism ID to follow CRITICAL RESULT CALLED TO, READ BACK BY AND VERIFIED WITH:  JASON ROBINS AT 9147 12/16/23 JG Performed at [facility], [address] [place], [place] [address]    Culture  ESCHERICHIA COLI (A)  Final   Report Status 12/18/2023 FINAL  Final   Organism ID, Bacteria ESCHERICHIA COLI  Final   Organism ID, Bacteria ESCHERICHIA COLI  Final      Susceptibility   Escherichia coli - KIRBY BAUER*    CEFAZOLIN  RESISTANT Resistant    Escherichia coli - MIC*    AMPICILLIN >=32 RESISTANT Resistant     CEFEPIME  <=0.12 SENSITIVE Sensitive     CEFTAZIDIME <=1 SENSITIVE Sensitive     CEFTRIAXONE  <=0.25 SENSITIVE Sensitive     CIPROFLOXACIN >=4 RESISTANT Resistant     GENTAMICIN <=1 SENSITIVE Sensitive     IMIPENEM <=0.25 SENSITIVE Sensitive     TRIMETH /SULFA  <=20 SENSITIVE Sensitive     AMPICILLIN/SULBACTAM >=32 RESISTANT Resistant     PIP/TAZO <=4 SENSITIVE Sensitive ug/mL    *  ESCHERICHIA COLI    ESCHERICHIA COLI  Aerobic/Anaerobic Culture w Gram Stain (surgical/deep wound)     Status: None   Collection Time: 12/15/23  4:12 PM   Specimen: Wound  Result Value Ref Range Status   Specimen Description   Final    WOUND Performed at [facility], [address] [place], [place] [address]    Special Requests   Final    SACRAL Performed at [facility], [address] [place], [place] [address]    Gram Stain NO WBC SEEN RARE GRAM NEGATIVE RODS   Final   Culture   Final    ABUNDANT KLEBSIELLA PNEUMONIAE FEW ESCHERICHIA COLI FEW BACTEROIDES FRAGILIS BETA LACTAMASE POSITIVE FEW ENTEROCOCCUS FAECALIS FEW CORYNEBACTERIUM STRIATUM Standardized susceptibility testing for this organism is not available. Performed at [facility], [address] [place], [place] [address]    Report Status 12/19/2023 FINAL  Final   Organism ID, Bacteria KLEBSIELLA PNEUMONIAE  Final   Organism ID, Bacteria ESCHERICHIA COLI  Final   Organism ID, Bacteria ENTEROCOCCUS FAECALIS  Final      Susceptibility   Escherichia coli - MIC*    AMPICILLIN >=32 RESISTANT Resistant     CEFEPIME  <=0.12 SENSITIVE Sensitive     CEFTAZIDIME <=1 SENSITIVE Sensitive     CEFTRIAXONE  <=0.25 SENSITIVE Sensitive     CIPROFLOXACIN >=4 RESISTANT Resistant     GENTAMICIN <=1 SENSITIVE Sensitive     IMIPENEM <=0.25 SENSITIVE Sensitive     TRIMETH /SULFA  <=20 SENSITIVE Sensitive     AMPICILLIN/SULBACTAM >=32 RESISTANT Resistant     PIP/TAZO <=4 SENSITIVE Sensitive ug/mL    * FEW ESCHERICHIA COLI   Enterococcus faecalis - MIC*    AMPICILLIN <=2 SENSITIVE Sensitive     VANCOMYCIN  >=32 RESISTANT Resistant     GENTAMICIN SYNERGY SENSITIVE Sensitive     * FEW ENTEROCOCCUS FAECALIS   Klebsiella pneumoniae - MIC*    AMPICILLIN RESISTANT Resistant     CEFEPIME  <=0.12 SENSITIVE Sensitive     CEFTAZIDIME <=1 SENSITIVE Sensitive     CEFTRIAXONE  <=0.25 SENSITIVE Sensitive      CIPROFLOXACIN <=0.25 SENSITIVE Sensitive     GENTAMICIN <=1 SENSITIVE Sensitive     IMIPENEM <=0.25 SENSITIVE Sensitive     TRIMETH /SULFA  <=20 SENSITIVE Sensitive     AMPICILLIN/SULBACTAM <=2 SENSITIVE Sensitive     PIP/TAZO <=4 SENSITIVE Sensitive ug/mL    * ABUNDANT KLEBSIELLA PNEUMONIAE  Blood Culture ID Panel (Reflexed)     Status: Abnormal   Collection Time: 12/15/23  4:12 PM  Result Value Ref Range Status   Enterococcus faecalis NOT DETECTED NOT DETECTED Final   Enterococcus Faecium NOT DETECTED NOT DETECTED  Final   Listeria monocytogenes NOT DETECTED NOT DETECTED Final   Staphylococcus species NOT DETECTED NOT DETECTED Final   Staphylococcus aureus (BCID) NOT DETECTED NOT DETECTED Final   Staphylococcus epidermidis NOT DETECTED NOT DETECTED Final   Staphylococcus lugdunensis NOT DETECTED NOT DETECTED Final   Streptococcus species NOT DETECTED NOT DETECTED Final   Streptococcus agalactiae NOT DETECTED NOT DETECTED Final   Streptococcus pneumoniae NOT DETECTED NOT DETECTED Final   Streptococcus pyogenes NOT DETECTED NOT DETECTED Final   A.calcoaceticus-baumannii NOT DETECTED NOT DETECTED Final   Bacteroides fragilis NOT DETECTED NOT DETECTED Final   Enterobacterales DETECTED (A) NOT DETECTED Final    Comment: Enterobacterales represent a large order of gram negative bacteria, not a single organism. CRITICAL RESULT CALLED TO, READ BACK BY AND VERIFIED WITH:  JASON ROBINS AT 1610 12/16/23 JG    Enterobacter cloacae complex NOT DETECTED NOT DETECTED Final   Escherichia coli DETECTED (A) NOT DETECTED Final    Comment: CRITICAL RESULT CALLED TO, READ BACK BY AND VERIFIED WITH:  JASON ROBINS AT 0529 12/16/23 JG    Klebsiella aerogenes NOT DETECTED NOT DETECTED Final   Klebsiella oxytoca NOT DETECTED NOT DETECTED Final   Klebsiella pneumoniae NOT DETECTED NOT DETECTED Final   Proteus species NOT DETECTED NOT DETECTED Final   Salmonella species NOT DETECTED NOT DETECTED Final    Serratia marcescens NOT DETECTED NOT DETECTED Final   Haemophilus influenzae NOT DETECTED NOT DETECTED Final   Neisseria meningitidis NOT DETECTED NOT DETECTED Final   Pseudomonas aeruginosa NOT DETECTED NOT DETECTED Final   Stenotrophomonas maltophilia NOT DETECTED NOT DETECTED Final   Candida albicans NOT DETECTED NOT DETECTED Final   Candida auris NOT DETECTED NOT DETECTED Final   Candida glabrata NOT DETECTED NOT DETECTED Final   Candida krusei NOT DETECTED NOT DETECTED Final   Candida parapsilosis NOT DETECTED NOT DETECTED Final   Candida tropicalis NOT DETECTED NOT DETECTED Final   Cryptococcus neoformans/gattii NOT DETECTED NOT DETECTED Final   CTX-M ESBL NOT DETECTED NOT DETECTED Final   Carbapenem resistance IMP NOT DETECTED NOT DETECTED Final   Carbapenem resistance KPC NOT DETECTED NOT DETECTED Final   Carbapenem resistance NDM NOT DETECTED NOT DETECTED Final   Carbapenem resist OXA 48 LIKE NOT DETECTED NOT DETECTED Final   Carbapenem resistance VIM NOT DETECTED NOT DETECTED Final    Comment: Performed at [facility], [address] [place], [place] [address]  Blood Culture (routine x 2)     Status: Abnormal   Collection Time: 12/15/23  4:30 PM   Specimen: BLOOD  Result Value Ref Range Status   Specimen Description   Final    BLOOD RIGHT ANTECUBITAL Performed at [facility], [address] [place], [place] [address]    Special Requests   Final    BOTTLES DRAWN AEROBIC AND ANAEROBIC Blood Culture results may not be optimal due to an inadequate volume of blood received in culture bottles Performed at [facility], [address] [place], [place] [address]    Culture  Setup Time   Final    GRAM NEGATIVE RODS IN BOTH AEROBIC AND ANAEROBIC BOTTLES CRITICAL VALUE NOTED.  VALUE IS CONSISTENT WITH PREVIOUSLY REPORTED AND CALLED VALUE. Performed at [facility], [address] [place], [place] [address]    Culture (A)  Final     ESCHERICHIA COLI SUSCEPTIBILITIES PERFORMED ON PREVIOUS CULTURE WITHIN THE LAST 5 DAYS. Performed at [facility], [address], [place], [place] [address]  Report Status 12/18/2023 FINAL  Final  Urine Culture (for pregnant, neutropenic or urologic patients or patients with an indwelling urinary catheter)     Status: None   Collection Time: 12/15/23  5:40 PM   Specimen: Urine, Clean Catch  Result Value Ref Range Status   Specimen Description   Final    URINE, CLEAN CATCH Performed at [facility], [address] [place], [place] [address]    Special Requests   Final    NONE Performed at [facility], [address] [place], [place] [address]    Culture   Final    NO GROWTH Performed at [facility], [address], [place], [place] [address]    Report Status 12/16/2023 FINAL  Final  MRSA Next Gen by PCR, Nasal     Status: None   Collection Time: 12/16/23  5:28 PM   Specimen: Nasal Mucosa; Nasal Swab  Result Value Ref Range Status   MRSA by PCR Next Gen NOT DETECTED NOT DETECTED Final    Comment: (NOTE) The GeneXpert MRSA Assay (FDA approved for NASAL specimens only), is one component of a comprehensive MRSA colonization surveillance program. It is not intended to diagnose MRSA infection nor to guide or monitor treatment for MRSA infections. Test performance is not FDA approved in patients less than 55 years old. Performed at [facility], [address] [place], [place] [address]     Coagulation Studies: No results for input(s): LABPROT, INR in the last 72 hours.   Urinalysis: No results for input(s): COLORURINE, LABSPEC, PHURINE, GLUCOSEU, HGBUR, BILIRUBINUR, KETONESUR, PROTEINUR, UROBILINOGEN, NITRITE, LEUKOCYTESUR in the last 72 hours.  Invalid input(s): APPERANCEUR     Imaging: No results found.    Medications:    cefTRIAXone  (ROCEPHIN )  IV 2 g (12/23/23 0509)    vitamin C  500  mg Per Tube BID   aspirin   81 mg Per Tube Daily   Chlorhexidine  Gluconate Cloth  6 each Topical Q0600   cloBAZam   5 mg Per Tube Q12H   feeding supplement (NEPRO CARB STEADY)  237 mL Oral TID BM   heparin   5,000 Units Subcutaneous Q8H   lacosamide   200 mg Per Tube BID   levETIRAcetam   500 mg Per Tube BID   multivitamin  1 tablet Per Tube QHS   mupirocin  ointment  1 Application Topical BID   nicotine   21 mg Transdermal Daily   omeprazole   20 mg Per Tube Daily   sertraline   25 mg Per Tube Daily   sevelamer  carbonate  800 mg Per Tube TID WC   zinc  sulfate (50mg  elemental zinc )  220 mg Per Tube Daily   hydrALAZINE , hydrOXYzine , ibuprofen , LORazepam , ondansetron  (ZOFRAN ) IV, oxyCODONE , senna-docusate  Assessment/ Plan:  Sherry [name] is a 68 y.o.  female with past medical conditions including seizures, chronic biliary duct dilation, CHF, hypertension, stroke, diastolic heart failure, depression anxiety, bipolar, anemia, breast cancer status post right mastectomy, polypharmacy, end-stage renal disease on hemodialysis, who was admitted to [facility] on 12/15/2023 for  Elevated LFTs [R79.89] Liver lesion [K76.9] Dilation of biliary tract [K83.8] Sepsis (HCC) [A41.9] Urinary tract infection without hematuria, site unspecified [N39.0] Altered mental status, unspecified altered mental status type [R41.82] Sepsis, due to unspecified organism, unspecified whether acute organ dysfunction present (HCC) [A41.9]   End stage renal disease on hemodialysis.  Patient receiving scheduled dialysis today, UF goal 1 to 1.5 L as tolerated.  Next treatment scheduled for Saturday.  2. Sepsis secondary to  UTI and sacral wound infection. Blood culture positive for E coli and Enterobacterales. Prescribed Vancomycin  and metronidazole  during this admission. Now receiving daily ceftriaxone .  Will discharge with continued IV antibiotics with dialysis, ceftazidime 1/1/2 g with each dialysis treatment until 6/18.  Outpatient  clinic has been notified.  3. Chronic biliary dilation with suspected cholangitis and liver abscess. CT abdomen and pelvis with contrast shows diffuse biliary duct dilatation with hypodense nodular areas in the liver concerning for intrahepatic abscesses versus metastatic disease. ERCP completed on 6/6 with biliary sphincterotomy.   4. Anemia of chronic kidney disease Lab Results  Component Value Date   HGB 8.7 (L) 12/23/2023     Due to breast cancer history, will avoid ESA's.  Hemoglobin within optimal range for this patient.  5. Secondary Hyperparathyroidism: with outpatient labs: PTH 354 on 09/01/23.    Lab Results  Component Value Date   CALCIUM  8.8 (L) 12/23/2023   CAION 1.13 (L) 05/25/2023   PHOS 6.2 (H) 05/26/2023  Will continue to monitor bone minerals, awaiting updated phosphorus. Patient prescribed auryxia and sevelamer  outpatient. Remains on sevelamer .   6. Hypertension with chronic kidney disease. Home regimen includes carvedilol , and hydralazine . Held at this time. Blood pressure 140/96 during dialysis   LOS: 8 Analie Katzman 6/12/202511:33 AM

## 2023-12-23 NOTE — Consult Note (Signed)
 WOC Nurse Consult Note: Reason for Consult: VAC change on sacrum wound. Wound type: Pressure injury stage 4. Notice a bone exposition at 3 o'clock position. Pressure Injury POA: Yes Measurement: 10 x 8 x 2 cm. Wound bed: 60% dark red, 40% yellow/brown slough. Drainage (amount, consistency, odor) 100 ml on canister at 1340. Periwound: skin tear at 3 o'clock position. White scars on the edges for previously injuries. Dressing procedure/placement/frequency: Removed old NPWT dressing Cleansed wound with normal saline Periwound skin protected with skin barrier wipe. Applied a Ring on the bottom of the wound, close to her anus, to avoid leaking. Filled wound with 1 piece of black foam  Sealed NPWT dressing at HG/133mmHG  Patient did not received IV/PO pain medication per bedside nurse prior to dressing change Patient tolerated procedure well   WOC nurse will continue to provide NPWT dressing changed due to the complexity of the dressing change.    Changes MON/THURS.   WOC team will follow further.   Please reconsult if further assistance is needed. Thank-you,  Rachel Budds BSN, RN, [employer], WOC  (Pager: [phone])

## 2023-12-23 NOTE — Plan of Care (Signed)
   Problem: Education: Goal: Knowledge of General Education information will improve Description Including pain rating scale, medication(s)/side effects and non-pharmacologic comfort measures Outcome: Progressing

## 2023-12-23 NOTE — Progress Notes (Addendum)
 Progress Note   Patient: Sherry [name] WUJ:[id number] DOB: [birth year] DOA: 12/15/2023     8 DOS: the patient was seen and examined on 12/23/2023   Brief hospital course: HPI from admission: Sherry [name] is a 68 y.o. female with medical history significant for ESRD on HD (on MWF schedule), seizure, chronic biliary duct dilation in the setting of a cholecystectomy which is felt to be physiological and normal per GI consult (this is documented by Dr. Gordon Latus on 02/24/22), chronic hepatitis C, opioid use disorder, stroke, s/p of gastrostomy, hypertension, diastolic CHF, stroke, depression with anxiety, bipolar, anemia,HCV, breast cancer (s/p right mastectomy), polypharmacy.  She was previously hospitalized at [facility] in February 2025 for E. coli pneumonia and bacteremia, cerebellar stroke and tonic-clonic seizures in the setting of severe PRES that required intubation and subsequent tracheostomy and PEG tube placement.  She presented to the hospital because of fever and altered mental status.  Reportedly, she was noted to be confused and she spiked a temperature with fever of 102.4 F on the day of admission.     ED Course: pt was found to have WBC 18.2, lactic acid of 1.4 --> 1.1, potassium 3.6, positive UA (turbid appearance, large amount of leukocyte, many bacteria, WBC > 50, squamous cell 11-20), negative PCR for COVID, flu and RSV, ammonia level<13, troponin 25 --> 22, abnormal liver function (ALP 1035, AST 214, ALT 67, total bilirubin 0.9). Blood pressure 150/72 --> 119/58, heart rate 95, RR 21, oxygen saturation 94% on room air.  Chest x-ray negative.  CT of head negative.  Patient is admitted to PCU as inpatient.  Dr. Zelda Hickman of renal is consulted.   Patient was admitted for further evaluation and management of sepsis due to E. Coli bacteremia, suspected UTI, possible cholangitis based on imaging, and worsening sacral wound as outlined in detail below.    Assessment and Plan:  Severe sepsis -  POA on admisison with fever, leukocytosis, tachycardia, tachypnea and lactic acidosis consistent with organ dysfunction E. coli bacteremia / Suspected acute cholangitis:  4 out of 4 blood cultures positive for E. coli.   No growth on urine culture Sacral wound culture showed multiple bacteria (Klebsiella pneumoniae, E. coli, Bacteroides fragilis, Enterococcus faecalis and Corynebacterium striatum). --Infectious disease consulted --Recommending total of 14 days of IV antibiotics - Rocephin  while admitted, transitioning to ceftazidime at dialysis after discharge     Chronic biliary dilatation from previous cholecystectomy Choledocholithiasis Suspected acute cholangitis Suspected multiple peripheral liver abscesses on MRCP Chronically elevated liver enzymes Chronic hepatitis C:  S/p ERCP on 12/2023 -- A filling defect was seen on the cholangiogram.  Multiple biliary strictures were found in the entire biliary tree.  Strictures were inflammatory.  Choledocholithiasis was found.  Complete removal was accomplished by biliary sphincterotomy and balloon extraction.  A biliary sphincterectomy was performed.  The biliary tree was swept and nothing was found.  1 plastic stent was placed into the common bile duct. --Trend LFT's and CBC --Monitor clinically - abdominal exam, mental status, fever curve     Sacral decubitus ulcer stage 3: Present on admission.   According to [facility], patient's niece, wound was contaminated with fecal material at facility for about a week prior to admission. --General surgery and WOC consulted --Wound vac placed 12/20/2023 --Continue frequent repositioning, off-loading pressure areas --WOC following for vac changes twice weekly - Mon/Thur    Acute metabolic encephalopathy:  Presented with lethargy, not following commands, only responsive to painful stimuli, per H&P.  Baseline: alert and  oriented x 3  Likely due to sepsis/infection.   CT head did not show any acute  stroke. Resolved. --Delirium precautions   Hypokalemia: resolved K 2.9 on 6/9 was replaced.  K 4.1 today --Monitor BMP, replace K PRN --Check Mg level   Acute on chronic anemia: H&H stable.   Hemoglobin up from 6.7 (transfused 1 unit pRBC's 12/16/23) >> 7.9 >> ...9.4. --Monitor H&H and transfuse as needed if Hbg < 7.   ESRD on TTS schedule:  --Nephrology following for dialysis  Chronic diastolic CHF: Compensated --Monitor volume status --Volume mgmt by dialysis   Seizure disorder: Continue clobazam , Vimpat  and Keppra .   History of stroke, dysphagia with gastrostomy tube in place Patient is on dysphagia 1 diet (confirmed by [place], niece).   UTI Ruled Out        Subjective: Pt seen in dialysis today.  She reports back pain worse than usual.  No fever/chills.  Pt asking for physical therapy to work with her so she can get back to walking.    Physical Exam: Vitals:   12/23/23 1030 12/23/23 1100 12/23/23 1130 12/23/23 1200  BP: 122/78 133/79 (!) 140/96 (!) 141/90  Pulse: 62 78 86 85  Resp: 13 11 17 15   Temp:      TempSrc:      SpO2: 100% 100% 100% 100%  Weight:      Height:       General exam: awake, alert, no acute distress HEENT: TD-like movements of mouth continuously, dry mucus membranes, hearing grossly normal  Respiratory system: on room air, normal respiratory effort. CTAB Cardiovascular system: RRR, no pedal edema.   Gastrointestinal system: soft, NT, ND Central nervous system: A&O x2+. no gross focal neurologic deficits, normal speech Skin: sacral wound vac in place Psychiatry: normal mood, congruent affect   Data Reviewed:  Notable labs - Cl 96 BUN 33 Cr 3.09 Ca 8.8   WBC 13.0 >> 10.3 Hbg improved 8.7 >> 9.4 >> 8.7 stable    Family Communication: sister at bedside 6/11  Disposition: Status is: Inpatient Remains inpatient appropriate because: needs close monitoring for ongoing clinical improvement on IV antibiotics prior to d/c.    Anticipate medically ready for d/c 6/13.   Planned Discharge Destination: Return to prior facility    Time spent: 38 minutes  Author: Montey Apa, DO 12/23/2023 12:25 PM  For on call review [email or web address].

## 2023-12-23 NOTE — Evaluation (Signed)
 Physical Therapy Evaluation Patient Details Name: Sherry [name] MRN: [id number] DOB: [birth year] Today's Date: 12/23/2023  History of Present Illness  68 y.o. female with medical history significant for ESRD on HD (on MWF schedule), seizure, chronic biliary duct dilation in the setting of a cholecystectomy which is felt to be physiological and normal per GI consult (this is documented by Dr. Gordon Latus on 02/24/22), chronic hepatitis C, opioid use disorder, stroke, s/p of gastrostomy, hypertension, diastolic CHF, stroke, depression with anxiety, bipolar, anemia,HCV, breast cancer (s/p right mastectomy), polypharmacy.  She was previously hospitalized at [facility] in February 2025 for E. coli pneumonia and bacteremia, cerebellar stroke and tonic-clonic seizures in the setting of severe PRES that required intubation and subsequent tracheostomy and PEG tube placement.  She presented to the hospital because of fever and altered mental status.  Clinical Impression  Pt  pleasant and engaged t/o session, however she seems to have limited awareness of her deficits and upon intro indicated that if someone let her she could just get up and walk with the walker.  Unfortunately she was very limited functionally needing mod/maxA to get to sitting EOB and needing constant assist (mostly mod, occasionally min) just to maintain upright at EOB losing balance backward with any decreased assistance challenges.  Pt with global weakness but most notedly in the R LE with minimal AROM.  Pt with significant sacral wound, reports she that apart from dialysis she spends most of there time in bed and relies on aides/family to change her diapers and do all home management.  Pt is functionally limited will benefit from continued PT to address this and decrease caregiver burden.      If plan is discharge home, recommend the following: A lot of help with bathing/dressing/bathroom;Assistance with cooking/housework;Assistance with  feeding;Direct supervision/assist for medications management;Assist for transportation;Help with stairs or ramp for entrance;Supervision due to cognitive status   Can travel by private vehicle        Equipment Recommendations  (per progress and goals; hospital/pressure wound bed, Alen Amy, w/c, etc)  Recommendations for Other Services       Functional Status Assessment Patient has had a recent decline in their functional status and demonstrates the ability to make significant improvements in function in a reasonable and predictable amount of time.     Precautions / Restrictions Precautions Precautions: Fall Required Braces or Orthoses:  (wound vac) Restrictions Weight Bearing Restrictions Per Provider Order: No      Mobility  Bed Mobility Overal bed mobility: Needs Assistance Bed Mobility: Supine to Sit, Sit to Supine     Supine to sit: Mod assist, Max assist Sit to supine: Mod assist, Max assist   General bed mobility comments: Pt showed good effort but ultimately managed minimal actual functional movement to/from EOB needing heavy assist for all aspects of the transition to/from sitting    Transfers                   General transfer comment: pt could not maintain sitting balance EOB, not safe to attempt standing despite strong desire to do so    Ambulation/Gait                  Stairs            Wheelchair Mobility     Tilt Bed    Modified Rankin (Stroke Patients Only)       Balance Overall balance assessment: Needs assistance Sitting-balance support: Bilateral upper extremity supported Sitting balance-Leahy Scale: Zero  Sitting balance - Comments: Pt consistently leaning backward needed either direct ModA or heavy single (L) UE hand held assist to keep from falling back     Standing balance-Leahy Scale:  (unsafe to attempt standing)                               Pertinent Vitals/Pain Pain Assessment Pain Assessment:  0-10 Pain Score: 6  Pain Location: sacral wound    Home Living Family/patient expects to be discharged to:: Unsure                   Additional Comments: reports PCA M-F for 3 hours, pt with confusion re: home setup, reports regularly home alone in  bed.    Prior Function               Mobility Comments: apparently prior to 11/24 hospitalization she was walking w/o AD independently - states she has not walked since ADLs Comments: per pt, aide 5 days/week for 3 hours each day who helps with bathing, dressing, grocery shopping, meal prep. appointments. Wears diapers.     Extremity/Trunk Assessment   Upper Extremity Assessment Upper Extremity Assessment:  (R grossly 2+/5, L grossly 3-/5)    Lower Extremity Assessment Lower Extremity Assessment:  (R grossly 2/5, L grossly 3-/5)       Communication   Communication Communication: No apparent difficulties    Cognition Arousal: Alert Behavior During Therapy: WFL for tasks assessed/performed   PT - Cognitive impairments: Difficult to assess                       PT - Cognition Comments: Pt alert to self knows she is in the hospital (but not which one) knows the year but not month/date.  Showed very poor awareness of deficits Following commands: Impaired Following commands impaired: Follows one step commands with increased time     Cueing Cueing Techniques: Verbal cues, Gestural cues, Tactile cues     General Comments General comments (skin integrity, edema, etc.): Pt showed good effort and willingness to participate but displayed poor awareness of deficits and ultimately is extremely functionally limited    Exercises     Assessment/Plan    PT Assessment Patient needs continued PT services  PT Problem List Decreased strength;Decreased range of motion;Decreased activity tolerance;Decreased balance;Decreased mobility;Decreased cognition;Decreased knowledge of use of DME;Decreased safety  awareness;Decreased knowledge of precautions;Pain       PT Treatment Interventions DME instruction;Gait training;Stair training;Functional mobility training;Therapeutic activities;Therapeutic exercise;Balance training;Neuromuscular re-education;Patient/family education;Wheelchair mobility training    PT Goals (Current goals can be found in the Care Plan section)  Acute Rehab PT Goals Patient Stated Goal: I want to walk again PT Goal Formulation: With patient Time For Goal Achievement: 01/05/24 Potential to Achieve Goals: Poor    Frequency Min 2X/week     Co-evaluation               AM-PAC PT 6 Clicks Mobility  Outcome Measure Help needed turning from your back to your side while in a flat bed without using bedrails?: Total Help needed moving from lying on your back to sitting on the side of a flat bed without using bedrails?: Total Help needed moving to and from a bed to a chair (including a wheelchair)?: Total Help needed standing up from a chair using your arms (e.g., wheelchair or bedside chair)?: Total Help needed to walk in hospital room?:  Total Help needed climbing 3-5 steps with a railing? : Total 6 Click Score: 6    End of Session   Activity Tolerance: Patient limited by fatigue Patient left: in bed;with call bell/phone within reach Nurse Communication: Mobility status PT Visit Diagnosis: Muscle weakness (generalized) (M62.81);Difficulty in walking, not elsewhere classified (R26.2);Other symptoms and signs involving the nervous system (R29.898);Unsteadiness on feet (R26.81)    Time: 3086-5784 PT Time Calculation (min) (ACUTE ONLY): 32 min   Charges:   PT Evaluation $PT Eval Moderate Complexity: 1 Mod PT Treatments $Therapeutic Activity: 8-22 mins PT General Charges $$ ACUTE PT VISIT: 1 Visit         Darice Edelman, DPT 12/23/2023, 6:26 PM

## 2023-12-24 DIAGNOSIS — A419 Sepsis, unspecified organism: Secondary | ICD-10-CM | POA: Diagnosis not present

## 2023-12-24 DIAGNOSIS — R6520 Severe sepsis without septic shock: Secondary | ICD-10-CM | POA: Diagnosis not present

## 2023-12-24 MED ORDER — OXYCODONE HCL 5 MG PO CAPS
5.0000 mg | ORAL_CAPSULE | ORAL | 0 refills | Status: DC | PRN
Start: 2023-12-24 — End: 2024-01-15

## 2023-12-24 MED ORDER — ASCORBIC ACID 500 MG PO TABS
500.0000 mg | ORAL_TABLET | Freq: Two times a day (BID) | ORAL | Status: DC
Start: 2023-12-24 — End: 2024-01-21

## 2023-12-24 MED ORDER — NEPRO/CARBSTEADY PO LIQD: 237.0000 mL | Freq: Three times a day (TID) | ORAL | Status: DC

## 2023-12-24 MED ORDER — DEXTROSE 5 % IV SOLN: INTRAVENOUS | Status: DC

## 2023-12-24 MED ORDER — ZINC SULFATE 220 (50 ZN) MG PO CAPS: 220.0000 mg | ORAL_CAPSULE | Freq: Every day | ORAL | Status: DC

## 2023-12-24 MED ORDER — RENA-VITE PO TABS: 1.0000 | ORAL_TABLET | Freq: Every day | ORAL | Status: DC

## 2023-12-24 NOTE — Progress Notes (Signed)
 PHARMACY CONSULT NOTE FOR:  OUTPATIENT  PARENTERAL ANTIBIOTIC THERAPY with Dialysis   Indication: E coli bacteremia/sacral decubitus ulcer Regimen: Ceftazidime with HD 1 g on TTH and 2 g on Saturday  End date: 12/29/23   No formal OPAT will be done as patient will receive antibiotics with dialysis. Nephrology aware and has spoken with OP center.     Thank you for allowing pharmacy to be a part of this patient's care.  Denson Flake, PharmD, BCPS, BCIDP Infectious Diseases Clinical Pharmacist Phone: [phone] 12/24/2023, 8:29 AM

## 2023-12-24 NOTE — TOC Progression Note (Signed)
 Transition of Care [facility]) - Progression Note    Patient Details  Name: Sherry [name] MRN: [id number] Date of Birth: 03-27-1956  [facility] CM/SW Contact  Zoe Hinds, RN Phone Number: 12/24/2023, 12:06 PM  Clinical Narrative:    Pt medically cleared for dc today per MD order. This CM called and spoke with admission Liasion Fabian Holster) at [facility] and confirmed bed offer, and that wound vac was at facility . Fabian Holster confirmed that facility  will start insurance auth for pt. This CM coordinated dc transportation with [facility] . This CM updated medical team of dc plan. No additional TOC needs requested or identified for dc needs at this time .      Barriers to Discharge: No Barriers Identified  Expected Discharge Plan and Services         Expected Discharge Date: 12/24/23                                     Social Determinants of Health (SDOH) Interventions SDOH Screenings   Food Insecurity: No Food Insecurity (12/16/2023)  Housing: Low Risk  (12/16/2023)  Transportation Needs: No Transportation Needs (12/16/2023)  Utilities: Not At Risk (12/16/2023)  Alcohol  Screen: Medium Risk (08/27/2017)  Depression (PHQ2-9): Medium Risk (12/20/2020)  Social Connections: Patient Unable To Answer (12/16/2023)  Tobacco Use: High Risk (12/17/2023)    Readmission Risk Interventions     No data to display

## 2023-12-24 NOTE — Plan of Care (Signed)
Goals met. Patient discharged

## 2023-12-24 NOTE — Discharge Summary (Signed)
 Physician Discharge Summary   Patient: Sherry [name] MRN: [id number] DOB: July 12, 1956  Admit date:     12/15/2023  Discharge date: 12/24/23  Discharge Physician: Montey Apa   PCP: Tita Form, MD   Recommendations at discharge:   Follow up with Primary Care in 1-2 weeks Continue Outpatient Dialysis T/T/S IV antibiotics to be continued at Dialysis through 12/29/2023 Continue Sacral Wound Care with wound vac changes twice weekly on Monday/Thursday   Discharge Diagnoses: Principal Problem:   Severe sepsis [facility]) Active Problems:   Liver lesion   Abnormal liver function   UTI (urinary tract infection)   Acute metabolic encephalopathy   ESRD on dialysis [facility])   Essential hypertension   Chronic diastolic CHF (congestive heart failure) (HCC)   Stroke (HCC)   Seizure (HCC)   Cigarette smoker   Hx of bipolar disorder   Sacral wound   E coli bacteremia   Cholangitis   Calculus of bile duct without cholecystitis with obstruction   Malnutrition of moderate degree  Resolved Problems:   * No resolved hospital problems. *  Hospital Course:  HPI from admission: Sherry [name] is a 68 y.o. female with medical history significant for ESRD on HD (on MWF schedule), seizure, chronic biliary duct dilation in the setting of a cholecystectomy which is felt to be physiological and normal per GI consult (this is documented by Dr. Gordon Latus on 02/24/22), chronic hepatitis C, opioid use disorder, stroke, s/p of gastrostomy, hypertension, diastolic CHF, stroke, depression with anxiety, bipolar, anemia,HCV, breast cancer (s/p right mastectomy), polypharmacy.  She was previously hospitalized at [facility] in February 2025 for E. coli pneumonia and bacteremia, cerebellar stroke and tonic-clonic seizures in the setting of severe PRES that required intubation and subsequent tracheostomy and PEG tube placement.  She presented to the hospital because of fever and altered mental status.  Reportedly, she was  noted to be confused and she spiked a temperature with fever of 102.4 F on the day of admission.     ED Course: pt was found to have WBC 18.2, lactic acid of 1.4 --> 1.1, potassium 3.6, positive UA (turbid appearance, large amount of leukocyte, many bacteria, WBC > 50, squamous cell 11-20), negative PCR for COVID, flu and RSV, ammonia level<13, troponin 25 --> 22, abnormal liver function (ALP 1035, AST 214, ALT 67, total bilirubin 0.9). Blood pressure 150/72 --> 119/58, heart rate 95, RR 21, oxygen saturation 94% on room air.  Chest x-ray negative.  CT of head negative.  Patient is admitted to PCU as inpatient.  Dr. Zelda Hickman of renal is consulted.     Patient was admitted for further evaluation and management of sepsis due to E. Coli bacteremia, suspected UTI, possible cholangitis based on imaging, and worsening sacral wound as outlined in detail below.   12/24/23 -- pt doing well and without acute complaints.  Patient is medically stable for discharge today, with remainder of IV antibiotics to be administered at dialysis.   Assessment and Plan:  E. coli bacteremia / Suspected acute cholangitis:  4 out of 4 blood cultures positive for E. coli.   No growth on urine culture Sacral wound culture showed multiple bacteria (Klebsiella pneumoniae, E. coli, Bacteroides fragilis, Enterococcus faecalis and Corynebacterium striatum) --Infectious disease consulted --Recommending total of 14 days of IV antibiotics  --Treated with IV Rocephin  while admitted --Transitioning to IV ceftazidime at dialysis after discharge through 6/18  1 g IV Tuesday / 1 g IV Thursday / 2 g IV Saturday   Severe  sepsis - POA on admisison with fever, leukocytosis, tachycardia, tachypnea and lactic acidosis consistent with organ dysfunction.  Sepsis physiology has resolved.     Chronic biliary dilatation from previous cholecystectomy Choledocholithiasis - resolved s/p ERCP Suspected acute cholangitis - clinically  undetermined.  No acute concerns.  Suspected multiple peripheral liver abscesses on MRCP Chronically elevated liver enzymes Chronic hepatitis C:  S/p ERCP on 12/2023 -- A filling defect was seen on the cholangiogram.  Multiple biliary strictures were found in the entire biliary tree.  Strictures were inflammatory.  Choledocholithiasis was found.  Complete removal was accomplished by biliary sphincterotomy and balloon extraction.  A biliary sphincterectomy was performed.  The biliary tree was swept and nothing was found.  1 plastic stent was placed into the common bile duct. --Trend LFT's and CBC in follow up --Monitor clinically - abdominal exam, mental status, fever curve --On IV antibiotics as above for E coli bacteremia     Sacral decubitus ulcer stage 4: Present on admission.   According to [facility], patient's niece, wound was contaminated with fecal material at facility for about a week prior to admission. --General surgery and WOC consulted --Wound vac placed 12/20/2023 --Continue frequent repositioning, off-loading pressure areas --Wound vac changes twice weekly - Mon/Thur  --Monitor wound for signs of infection --Wound cultures were polymicrobial, suspect colonization.  Infectious disease recommended if patient clinically worsening with signs of infection, Daptomycin should be considered based on wound culture results.   Acute metabolic encephalopathy:  Presented with lethargy, not following commands, only responsive to painful stimuli, per H&P.  Baseline: alert and oriented x 3  Likely due to sepsis/infection.   CT head did not show any acute stroke. Resolved. --Delirium precautions   Hypokalemia: resolved K 2.9 on 6/9 was replaced.  K 4.1>>3.9 today. Mg normal 2.4 --Monitor BMP, replace K PRN   Acute on chronic anemia: H&H stable.   Hemoglobin up from 6.7 (transfused 1 unit pRBC's 12/16/23) >> 7.9 >> ...9.4 >> 8.7 stable. --Monitor H&H and transfuse as needed if Hbg < 7.   ESRD  on TTS schedule:  --Nephrology following for dialysis --Resume outpatient dialysis   Chronic diastolic CHF: Compensated --Monitor volume status --Volume mgmt by dialysis   Seizure disorder: Continue clobazam , Vimpat  and Keppra .   History of stroke, dysphagia with gastrostomy tube in place Patient is on dysphagia 1 diet (confirmed by [place], niece).     UTI Ruled Out        Consultants: Infectious disease, Nephrology, Wound Care  Procedures performed: wound vac placement & changes, hemodialysis, ERCP   Disposition: Long term care facility  Diet recommendation:  Dysphagia type 1 Thin Liquid  DISCHARGE MEDICATION: Allergies as of 12/24/2023       Reactions   Abilify [aripiprazole] Other (See Comments)   Tardive dyskinesia Oral   Remeron  [mirtazapine ] Other (See Comments)   Wgt stimulation /gain, Dizziness, Patient says can tolerate   Trazodone  And Nefazodone Other (See Comments)   Nightmares/sleep diturbance   Augmentin [amoxicillin -pot Clavulanate]    Flexeril [cyclobenzaprine] Other (See Comments)   Pt states Flexeril makes her feel depressed    Amoxicillin  Diarrhea        Medication List     PAUSE taking these medications    carvedilol  12.5 MG tablet Wait to take this until your doctor or other care provider tells you to start again. Commonly known as: COREG  Take 25 mg by mouth 2 (two) times daily.   hydrALAZINE  50 MG tablet Wait to take this  until your doctor or other care provider tells you to start again. Commonly known as: APRESOLINE  Take 50 mg by mouth 3 (three) times daily.       STOP taking these medications    diazepam  10 MG tablet Commonly known as: VALIUM    heparin  5000 UNIT/ML injection   traMADol  50 MG tablet Commonly known as: ULTRAM        TAKE these medications    Acetaminophen  325 MG Caps Take 1 tablet by mouth every 6 (six) hours as needed.   ascorbic acid  500 MG tablet Commonly known as: VITAMIN C  Place 1 tablet  (500 mg total) into feeding tube 2 (two) times daily.   aspirin  81 MG chewable tablet Place 81 mg into feeding tube daily.   cefTAZidime in dextrose  5 % 50 mL Give 1g IV on Tuesdays, 1g IV on Thursdays, 2g IV on Saturdays at dialysis.  Stop date 12/29/2023.   cetirizine HCl 1 MG/ML solution Commonly known as: ZYRTEC Take 10 mg by mouth daily.   cloBAZam  2.5 MG/ML solution Commonly known as: ONFI  Place 2 mLs into feeding tube every 12 (twelve) hours.   diphenhydrAMINE  25 mg capsule Commonly known as: BENADRYL  Place 25 mg into feeding tube every 6 (six) hours as needed.   feeding supplement (NEPRO CARB STEADY) Liqd Take 237 mLs by mouth 3 (three) times daily between meals.   gabapentin  100 MG capsule Commonly known as: NEURONTIN  100 mg 3 (three) times daily. Via G-tube   hydrocortisone  cream 1 % Apply 1 Application topically 2 (two) times daily.   hydrOXYzine  25 MG tablet Commonly known as: ATARAX  Take 25 mg by mouth 3 (three) times daily as needed.   lacosamide  200 MG Tabs tablet Commonly known as: VIMPAT  Place 200 mg into feeding tube 2 (two) times daily.   levETIRAcetam  100 MG/ML solution Commonly known as: KEPPRA  Place 5 mLs into feeding tube 2 (two) times daily.   multivitamin Tabs tablet Place 1 tablet into feeding tube at bedtime.   mupirocin  ointment 2 % Commonly known as: BACTROBAN  Apply 1 Application topically 2 (two) times daily.   omeprazole  20 MG tablet Commonly known as: PRILOSEC OTC Take 20 mg by mouth daily. Per G-tube   ondansetron  4 MG/5ML solution Commonly known as: ZOFRAN  Place 5 mLs into feeding tube daily.   oxycodone  5 MG capsule Commonly known as: OXY-IR Take 1 capsule (5 mg total) by mouth every 4 (four) hours as needed for pain.   pantoprazole  40 MG tablet Commonly known as: Protonix  Take 1 tablet (40 mg total) by mouth daily.   polyethylene glycol 17 g packet Commonly known as: MIRALAX  / GLYCOLAX  Take 17 g by mouth daily.    senna-docusate 8.6-50 MG tablet Commonly known as: Senokot-S Take 1 tablet by mouth at bedtime as needed for mild constipation.   sertraline  25 MG tablet Commonly known as: ZOLOFT  Take 25 mg by mouth daily.   sevelamer  carbonate 800 MG tablet Commonly known as: RENVELA  Place 800 mg into feeding tube 3 (three) times daily with meals.   Ventolin  HFA 108 (90 Base) MCG/ACT inhaler Generic drug: albuterol  Inhale 2 puffs into the lungs every 4 (four) hours as needed.   zinc  sulfate (50mg  elemental zinc ) 220 (50 Zn) MG capsule Place 1 capsule (220 mg total) into feeding tube daily.               Discharge Care Instructions  (From admission, onward)           Start  Ordered   12/24/23 0000  Change dressing (specify)       Comments: Dressing change: TWICE WEEKLY Mondays and Thursdays.   12/24/23 0835   12/24/23 0000  Discharge wound care:       Comments: As above.  Wound Vac Dressing procedure/placement/frequency: Remove old NPWT dressing Cleanse wound with normal saline Periwound skin protected with skin barrier wipe. Apply a Ring on the bottom of the wound, close to her anus, to avoid leaking. Filled wound with 1 piece of black foam  Sealed NPWT dressing at HG/157mmHG  Change Vac TWICE weekly - Mondays and Thursdays   12/24/23 0835            Discharge Exam: Filed Weights   12/21/23 1337 12/23/23 0500 12/24/23 0500  Weight: 51.3 kg 54.4 kg 54.3 kg   General exam: awake, alert, no acute distress, chronically ill appearing HEENT: tardive dyskinesia movements of mouth, moist mucus membranes, hearing grossly normal  Respiratory system: CTAB, no wheezes, rales or rhonchi, normal respiratory effort. Cardiovascular system: normal S1/S2, RRR, no pedal edema.   Gastrointestinal system: soft, NT, ND, G tube present Central nervous system: stable unchanged right-sided weakness, TD movements of mouth, normal speech, no new focal neurologic deficits Skin:  wound vac to sacrum, dry, intact, normal temperature Psychiatry: normal mood, congruent affect, judgement and insight appear normal   Condition at discharge: stable  The results of significant diagnostics from this hospitalization (including imaging, microbiology, ancillary and laboratory) are listed below for reference.   Imaging Studies: DG C-Arm 1-60 Min-No Report Result Date: 12/17/2023 Fluoroscopy was utilized by the requesting physician.  No radiographic interpretation.   MR ABDOMEN MRCP W WO CONTAST Result Date: 12/16/2023 CLINICAL DATA:  Right upper quadrant pain. Evaluate for biliary disease. EXAM: MRI ABDOMEN WITHOUT AND WITH CONTRAST (INCLUDING MRCP) TECHNIQUE: Multiplanar multisequence MR imaging of the abdomen was performed both before and after the administration of intravenous contrast. Heavily T2-weighted images of the biliary and pancreatic ducts were obtained, and three-dimensional MRCP images were rendered by post processing. CONTRAST:  7mL GADAVIST  GADOBUTROL  1 MMOL/ML IV SOLN COMPARISON:  CT chest, abdomen and pelvis from 12/15/2023 FINDINGS: Note, exam detail is diminished due to motion artifact. Lower chest: Trace bilateral pleural effusions with overlying subpleural consolidation noted in both lung bases. . Hepatobiliary: Within the right lobe of liver there are multifocal areas of mural enhancing T2 hyperintense foci which are favored to represent dilated biliary radicles. The less dilated more central bile ducts within the right lobe have irregular appearance with multiple focal areas of narrowing giving it a string of pearls like appearance. Status post cholecystectomy. The common bile duct is dilated measuring 2.1 cm proximally with be quite narrowing of the distal 7 mm of the CBD, image 8/14. Debris is noted within the CBD with a few small foci of signal void artifact measuring approximately 3 mm which may reflect small stones versus signal void artifact. Pancreas: No mass,  inflammatory changes, or other parenchymal abnormality identified. Spleen:  Within normal limits in size and appearance. Adrenals/Urinary Tract: Normal adrenal glands. Bilateral renal cortical atrophy. No hydronephrosis. Multiple bilateral renal cysts are identified. The largest arises off the upper pole of the left kidney measuring 3.7 x 2.9 cm. Stomach/Bowel: Stomach is normal. No dilated loops of bowel identified within the imaged portions of the abdomen. Vascular/Lymphatic: Normal caliber of the abdominal aorta. The upper abdominal vascularity appears patent. No adenopathy. Other:  No free fluid or fluid collections. Musculoskeletal: Within the right  paramidline ventral abdominal wall there is a small subcutaneous nodule measuring 1.5 cm. This is T1 and T2 hyperintense without enhancement and is favored to represent a small hematoma. Possibly due to injection site. Subcutaneous edema is noted along both flanks. No enhancing bone abnormality. IMPRESSION: 1. Exam detail is diminished due to motion artifact. 2. Within the right lobe of liver there are multifocal areas of mural enhancing T2 hyperintense foci which are favored to represent dilated biliary radicles. The less dilated more central bile ducts within the right lobe have irregular appearance with multiple focal areas of narrowing giving it a string of pearls like appearance. In the acute setting imaging findings are concerning for cholangitis. Differential considerations include multiple peripheral liver abscesses. Underlying malignancy is considered less favored. Follow-up imaging advised to ensure resolution following appropriate therapy. 3. Status post cholecystectomy. Common bile duct dilatation measures up to 2.1 cm. Abrupt focal narrowing of the distal 7 mm of the CBD is favored to represent underlying stricture possibly postinflammatory or secondary to previously passed stone. Debris is noted within the CBD with a few small foci of signal void  artifact measuring approximately 3 mm which may reflect small stones versus signal void artifact. 4. Trace bilateral pleural effusions with overlying subpleural consolidation noted in both lung bases. 5. Bilateral renal cortical atrophy. 6. Small subcutaneous nodule within the right paramidline ventral abdominal wall is favored to represent a small hematoma. Possibly due to injection site. Electronically Signed   By: Kimberley Penman M.D.   On: 12/16/2023 04:45   MR 3D Recon At Scanner Result Date: 12/16/2023 CLINICAL DATA:  Right upper quadrant pain. Evaluate for biliary disease. EXAM: MRI ABDOMEN WITHOUT AND WITH CONTRAST (INCLUDING MRCP) TECHNIQUE: Multiplanar multisequence MR imaging of the abdomen was performed both before and after the administration of intravenous contrast. Heavily T2-weighted images of the biliary and pancreatic ducts were obtained, and three-dimensional MRCP images were rendered by post processing. CONTRAST:  7mL GADAVIST  GADOBUTROL  1 MMOL/ML IV SOLN COMPARISON:  CT chest, abdomen and pelvis from 12/15/2023 FINDINGS: Note, exam detail is diminished due to motion artifact. Lower chest: Trace bilateral pleural effusions with overlying subpleural consolidation noted in both lung bases. . Hepatobiliary: Within the right lobe of liver there are multifocal areas of mural enhancing T2 hyperintense foci which are favored to represent dilated biliary radicles. The less dilated more central bile ducts within the right lobe have irregular appearance with multiple focal areas of narrowing giving it a string of pearls like appearance. Status post cholecystectomy. The common bile duct is dilated measuring 2.1 cm proximally with be quite narrowing of the distal 7 mm of the CBD, image 8/14. Debris is noted within the CBD with a few small foci of signal void artifact measuring approximately 3 mm which may reflect small stones versus signal void artifact. Pancreas: No mass, inflammatory changes, or other  parenchymal abnormality identified. Spleen:  Within normal limits in size and appearance. Adrenals/Urinary Tract: Normal adrenal glands. Bilateral renal cortical atrophy. No hydronephrosis. Multiple bilateral renal cysts are identified. The largest arises off the upper pole of the left kidney measuring 3.7 x 2.9 cm. Stomach/Bowel: Stomach is normal. No dilated loops of bowel identified within the imaged portions of the abdomen. Vascular/Lymphatic: Normal caliber of the abdominal aorta. The upper abdominal vascularity appears patent. No adenopathy. Other:  No free fluid or fluid collections. Musculoskeletal: Within the right paramidline ventral abdominal wall there is a small subcutaneous nodule measuring 1.5 cm. This is T1 and T2 hyperintense  without enhancement and is favored to represent a small hematoma. Possibly due to injection site. Subcutaneous edema is noted along both flanks. No enhancing bone abnormality. IMPRESSION: 1. Exam detail is diminished due to motion artifact. 2. Within the right lobe of liver there are multifocal areas of mural enhancing T2 hyperintense foci which are favored to represent dilated biliary radicles. The less dilated more central bile ducts within the right lobe have irregular appearance with multiple focal areas of narrowing giving it a string of pearls like appearance. In the acute setting imaging findings are concerning for cholangitis. Differential considerations include multiple peripheral liver abscesses. Underlying malignancy is considered less favored. Follow-up imaging advised to ensure resolution following appropriate therapy. 3. Status post cholecystectomy. Common bile duct dilatation measures up to 2.1 cm. Abrupt focal narrowing of the distal 7 mm of the CBD is favored to represent underlying stricture possibly postinflammatory or secondary to previously passed stone. Debris is noted within the CBD with a few small foci of signal void artifact measuring approximately 3  mm which may reflect small stones versus signal void artifact. 4. Trace bilateral pleural effusions with overlying subpleural consolidation noted in both lung bases. 5. Bilateral renal cortical atrophy. 6. Small subcutaneous nodule within the right paramidline ventral abdominal wall is favored to represent a small hematoma. Possibly due to injection site. Electronically Signed   By: Kimberley Penman M.D.   On: 12/16/2023 04:45   CT Head Wo Contrast Result Date: 12/15/2023 CLINICAL DATA:  Mental status change, unknown cause EXAM: CT HEAD WITHOUT CONTRAST TECHNIQUE: Contiguous axial images were obtained from the base of the skull through the vertex without intravenous contrast. RADIATION DOSE REDUCTION: This exam was performed according to the departmental dose-optimization program which includes automated exposure control, adjustment of the mA and/or kV according to patient size and/or use of iterative reconstruction technique. COMPARISON:  Head CT 09/17/2023 FINDINGS: Brain: No intracranial hemorrhage, mass effect, or midline shift. Stable degree of atrophy. No hydrocephalus. The basilar cisterns are patent. Moderate to advanced periventricular and deep white matter hypodensity typical of chronic small vessel ischemia, unchanged. Remote lacunar infarct in the left basal ganglia. Remote left cerebellar infarcts. No evidence of territorial infarct or acute ischemia. No extra-axial or intracranial fluid collection. Vascular: Atherosclerosis of skullbase vasculature without hyperdense vessel or abnormal calcification. Skull: No fracture or focal lesion. Sinuses/Orbits: No acute finding. Other: None. IMPRESSION: 1. No acute intracranial abnormality. 2. Stable atrophy, chronic small vessel ischemia, and remote infarcts. Electronically Signed   By: Chadwick Colonel M.D.   On: 12/15/2023 18:58   CT CHEST ABDOMEN PELVIS W CONTRAST Result Date: 12/15/2023 CLINICAL DATA:  Sepsis. EXAM: CT CHEST, ABDOMEN, AND PELVIS WITH  CONTRAST TECHNIQUE: Multidetector CT imaging of the chest, abdomen and pelvis was performed following the standard protocol during bolus administration of intravenous contrast. RADIATION DOSE REDUCTION: This exam was performed according to the departmental dose-optimization program which includes automated exposure control, adjustment of the mA and/or kV according to patient size and/or use of iterative reconstruction technique. CONTRAST:  OMNIPAQUE  IOHEXOL  300 MG/ML  SOLN COMPARISON:  CT of the abdomen pelvis dated 09/06/2023. FINDINGS: Evaluation of this exam is limited due to respiratory motion. CT CHEST FINDINGS Cardiovascular: There is no cardiomegaly or pericardial effusion. There is coronary vascular calcification and calcification of the mitral annulus. Moderate atherosclerotic calcification of the thoracic aorta. No aneurysmal dilatation or dissection. The central pulmonary arteries are grossly unremarkable. Right IJ catheter with tip in the right atrium. Mediastinum/Nodes: No  obvious hilar or mediastinal adenopathy. The esophagus is grossly unremarkable. No mediastinal fluid collection. Lungs/Pleura: There is bibasilar subpleural atelectasis. Pneumonia is less likely but not excluded clinical correlation recommended. No pleural effusion or pneumothorax. The central airways are patent. Musculoskeletal: No acute osseous pathology. CT ABDOMEN PELVIS FINDINGS No intra-abdominal free air or free fluid. Hepatobiliary: There is diffuse biliary duct dilatation. Several small hypodense nodular areas in the liver measure up to 15 mm in the right lobe. These are mild evaluated with concerning for intrahepatic abscesses versus less likely metastatic disease. Clinical correlation recommended. The common bile duct measures approximately 2 cm in diameter. No radiopaque calculus noted in the central CBD. Pancreas: Unremarkable. No pancreatic ductal dilatation or surrounding inflammatory changes. Spleen: Normal in  size without focal abnormality. Adrenals/Urinary Tract: The right adrenal gland is unremarkable. There is a 2 cm left adrenal nodule: Not evaluated on this CT but present on the prior study. This was previously characterized as adenoma on CT of 11/18/2019. Moderate bilateral renal parenchyma atrophy. Small bilateral renal cysts and hypodense lesions which are too small to characterize. There is no hydronephrosis on either side. The urinary bladder is minimally distended. Stomach/Bowel: Percutaneous gastrostomy with balloon in the proximal body of the stomach. There is moderate to large amount of dense stool throughout the colon and within the rectal vault. There is no bowel obstruction or active inflammation. The appendix is normal. Vascular/Lymphatic: Advanced aortoiliac atherosclerotic disease. The IVC is unremarkable. No portal venous gas. There is no adenopathy. Reproductive: Hysterectomy.  No suspicious adnexal masses. Other: Midline anterior pelvic wall incisional scar. Ventral hernia repair mesh. Multiple subcutaneous nodules in the anterior abdominal wall likely related to subcutaneous injections. No fluid collection. Musculoskeletal: Osteopenia with degenerative changes of the spine. Lower lumbar posterior fusion. Severe arthritic changes of the right hip. No acute osseous pathology. IMPRESSION: 1. Diffuse biliary duct dilatation with several small hypodense nodular areas in the liver concerning for intrahepatic abscesses versus less likely metastatic disease. Further evaluation with MRI/MRCP is recommended. 2. Bibasilar subpleural atelectasis. Pneumonia is less likely but not excluded. 3. 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 4. Constipation with possible fecal impaction in the rectal vault. No bowel obstruction. Normal appendix. 5.  Aortic Atherosclerosis (ICD10-I70.0). Electronically Signed   By: Angus Bark M.D.   On: 12/15/2023 18:57   DG Chest Port 1  View Result Date: 12/15/2023 CLINICAL DATA:  Question of sepsis to evaluate for abnormality. Dialysis patient. Altered mental status. Fever. EXAM: PORTABLE CHEST 1 VIEW COMPARISON:  09/21/2023 FINDINGS: Right central venous catheter with tip over the right atrium. Postoperative changes in the cervical spine. Shallow inspiration. Heart size and pulmonary vascularity are normal for technique. Lungs are clear. No pleural effusion or pneumothorax. Mediastinal contours appear intact. Degenerative changes in the spine. IMPRESSION: Shallow inspiration.  No evidence of active pulmonary disease. Electronically Signed   By: Boyce Byes M.D.   On: 12/15/2023 17:55    Microbiology: Results for orders placed or performed during the hospital encounter of 12/15/23  Resp panel by RT-PCR (RSV, Flu A&B, Covid) Anterior Nasal Swab     Status: None   Collection Time: 12/15/23  4:11 PM   Specimen: Anterior Nasal Swab  Result Value Ref Range Status   SARS Coronavirus 2 by RT PCR NEGATIVE NEGATIVE Final    Comment: (NOTE) SARS-CoV-2 target nucleic acids are NOT DETECTED.  The SARS-CoV-2 RNA is generally detectable in upper respiratory specimens during the acute phase of  infection. The lowest concentration of SARS-CoV-2 viral copies this assay can detect is 138 copies/mL. A negative result does not preclude SARS-Cov-2 infection and should not be used as the sole basis for treatment or other patient management decisions. A negative result may occur with  improper specimen collection/handling, submission of specimen other than nasopharyngeal swab, presence of viral mutation(s) within the areas targeted by this assay, and inadequate number of viral copies(<138 copies/mL). A negative result must be combined with clinical observations, patient history, and epidemiological information. The expected result is Negative.  Fact Sheet for Patients:  BloggerCourse.com  Fact Sheet for Healthcare  Providers:  SeriousBroker.it  This test is no t yet approved or cleared by the [place]  FDA and  has been authorized for detection and/or diagnosis of SARS-CoV-2 by FDA under an Emergency Use Authorization (EUA). This EUA will remain  in effect (meaning this test can be used) for the duration of the COVID-19 declaration under Section 564(b)(1) of the Act, 21 U.S.C.section 360bbb-3(b)(1), unless the authorization is terminated  or revoked sooner.       Influenza A by PCR NEGATIVE NEGATIVE Final   Influenza B by PCR NEGATIVE NEGATIVE Final    Comment: (NOTE) The Xpert Xpress SARS-CoV-2/FLU/RSV plus assay is intended as an aid in the diagnosis of influenza from Nasopharyngeal swab specimens and should not be used as a sole basis for treatment. Nasal washings and aspirates are unacceptable for Xpert Xpress SARS-CoV-2/FLU/RSV testing.  Fact Sheet for Patients: BloggerCourse.com  Fact Sheet for Healthcare Providers: SeriousBroker.it  This test is not yet approved or cleared by the [place]  FDA and has been authorized for detection and/or diagnosis of SARS-CoV-2 by FDA under an Emergency Use Authorization (EUA). This EUA will remain in effect (meaning this test can be used) for the duration of the COVID-19 declaration under Section 564(b)(1) of the Act, 21 U.S.C. section 360bbb-3(b)(1), unless the authorization is terminated or revoked.     Resp Syncytial Virus by PCR NEGATIVE NEGATIVE Final    Comment: (NOTE) Fact Sheet for Patients: BloggerCourse.com  Fact Sheet for Healthcare Providers: SeriousBroker.it  This test is not yet approved or cleared by the [place]  FDA and has been authorized for detection and/or diagnosis of SARS-CoV-2 by FDA under an Emergency Use Authorization (EUA). This EUA will remain in effect (meaning this test can be  used) for the duration of the COVID-19 declaration under Section 564(b)(1) of the Act, 21 U.S.C. section 360bbb-3(b)(1), unless the authorization is terminated or revoked.  Performed at [facility], [address], [place], [place] [address]   Blood Culture (routine x 2)     Status: Abnormal   Collection Time: 12/15/23  4:12 PM   Specimen: BLOOD  Result Value Ref Range Status   Specimen Description   Final    BLOOD RIGHT ANTECUBITAL Performed at [facility], [address] [place], [place] [address]    Special Requests   Final    BOTTLES DRAWN AEROBIC AND ANAEROBIC Blood Culture results may not be optimal due to an inadequate volume of blood received in culture bottles Performed at [facility], [address] [place], [place] [address]    Culture  Setup Time   Final    GRAM NEGATIVE RODS IN BOTH AEROBIC AND ANAEROBIC BOTTLES Organism ID to follow CRITICAL RESULT CALLED TO, READ BACK BY AND VERIFIED WITH:  JASON ROBINS AT 1308 12/16/23 JG Performed at [facility], [address] [place], [place] [address]  Culture ESCHERICHIA COLI (A)  Final   Report Status 12/18/2023 FINAL  Final   Organism ID, Bacteria ESCHERICHIA COLI  Final   Organism ID, Bacteria ESCHERICHIA COLI  Final      Susceptibility   Escherichia coli - KIRBY BAUER*    CEFAZOLIN  RESISTANT Resistant    Escherichia coli - MIC*    AMPICILLIN >=32 RESISTANT Resistant     CEFEPIME  <=0.12 SENSITIVE Sensitive     CEFTAZIDIME <=1 SENSITIVE Sensitive     CEFTRIAXONE  <=0.25 SENSITIVE Sensitive     CIPROFLOXACIN >=4 RESISTANT Resistant     GENTAMICIN <=1 SENSITIVE Sensitive     IMIPENEM <=0.25 SENSITIVE Sensitive     TRIMETH /SULFA  <=20 SENSITIVE Sensitive     AMPICILLIN/SULBACTAM >=32 RESISTANT Resistant     PIP/TAZO <=4 SENSITIVE Sensitive ug/mL    * ESCHERICHIA COLI    ESCHERICHIA COLI  Aerobic/Anaerobic Culture w Gram Stain (surgical/deep wound)     Status: None    Collection Time: 12/15/23  4:12 PM   Specimen: Wound  Result Value Ref Range Status   Specimen Description   Final    WOUND Performed at [facility], [address], [place], [place] [address]    Special Requests   Final    SACRAL Performed at [facility], [address] [place], [place] [address]    Gram Stain NO WBC SEEN RARE GRAM NEGATIVE RODS   Final   Culture   Final    ABUNDANT KLEBSIELLA PNEUMONIAE FEW ESCHERICHIA COLI FEW BACTEROIDES FRAGILIS BETA LACTAMASE POSITIVE FEW ENTEROCOCCUS FAECALIS FEW CORYNEBACTERIUM STRIATUM Standardized susceptibility testing for this organism is not available. Performed at [facility], [address] [place], [place] [address]    Report Status 12/19/2023 FINAL  Final   Organism ID, Bacteria KLEBSIELLA PNEUMONIAE  Final   Organism ID, Bacteria ESCHERICHIA COLI  Final   Organism ID, Bacteria ENTEROCOCCUS FAECALIS  Final      Susceptibility   Escherichia coli - MIC*    AMPICILLIN >=32 RESISTANT Resistant     CEFEPIME  <=0.12 SENSITIVE Sensitive     CEFTAZIDIME <=1 SENSITIVE Sensitive     CEFTRIAXONE  <=0.25 SENSITIVE Sensitive     CIPROFLOXACIN >=4 RESISTANT Resistant     GENTAMICIN <=1 SENSITIVE Sensitive     IMIPENEM <=0.25 SENSITIVE Sensitive     TRIMETH /SULFA  <=20 SENSITIVE Sensitive     AMPICILLIN/SULBACTAM >=32 RESISTANT Resistant     PIP/TAZO <=4 SENSITIVE Sensitive ug/mL    * FEW ESCHERICHIA COLI   Enterococcus faecalis - MIC*    AMPICILLIN <=2 SENSITIVE Sensitive     VANCOMYCIN  >=32 RESISTANT Resistant     GENTAMICIN SYNERGY SENSITIVE Sensitive     * FEW ENTEROCOCCUS FAECALIS   Klebsiella pneumoniae - MIC*    AMPICILLIN RESISTANT Resistant     CEFEPIME  <=0.12 SENSITIVE Sensitive     CEFTAZIDIME <=1 SENSITIVE Sensitive     CEFTRIAXONE  <=0.25 SENSITIVE Sensitive     CIPROFLOXACIN <=0.25 SENSITIVE Sensitive     GENTAMICIN <=1 SENSITIVE Sensitive     IMIPENEM <=0.25 SENSITIVE Sensitive      TRIMETH /SULFA  <=20 SENSITIVE Sensitive     AMPICILLIN/SULBACTAM <=2 SENSITIVE Sensitive     PIP/TAZO <=4 SENSITIVE Sensitive ug/mL    * ABUNDANT KLEBSIELLA PNEUMONIAE  Blood Culture ID Panel (Reflexed)     Status: Abnormal   Collection Time: 12/15/23  4:12 PM  Result Value Ref Range Status   Enterococcus faecalis NOT DETECTED NOT DETECTED Final   Enterococcus Faecium NOT DETECTED NOT  DETECTED Final   Listeria monocytogenes NOT DETECTED NOT DETECTED Final   Staphylococcus species NOT DETECTED NOT DETECTED Final   Staphylococcus aureus (BCID) NOT DETECTED NOT DETECTED Final   Staphylococcus epidermidis NOT DETECTED NOT DETECTED Final   Staphylococcus lugdunensis NOT DETECTED NOT DETECTED Final   Streptococcus species NOT DETECTED NOT DETECTED Final   Streptococcus agalactiae NOT DETECTED NOT DETECTED Final   Streptococcus pneumoniae NOT DETECTED NOT DETECTED Final   Streptococcus pyogenes NOT DETECTED NOT DETECTED Final   A.calcoaceticus-baumannii NOT DETECTED NOT DETECTED Final   Bacteroides fragilis NOT DETECTED NOT DETECTED Final   Enterobacterales DETECTED (A) NOT DETECTED Final    Comment: Enterobacterales represent a large order of gram negative bacteria, not a single organism. CRITICAL RESULT CALLED TO, READ BACK BY AND VERIFIED WITH:  JASON ROBINS AT 1308 12/16/23 JG    Enterobacter cloacae complex NOT DETECTED NOT DETECTED Final   Escherichia coli DETECTED (A) NOT DETECTED Final    Comment: CRITICAL RESULT CALLED TO, READ BACK BY AND VERIFIED WITH:  JASON ROBINS AT 0529 12/16/23 JG    Klebsiella aerogenes NOT DETECTED NOT DETECTED Final   Klebsiella oxytoca NOT DETECTED NOT DETECTED Final   Klebsiella pneumoniae NOT DETECTED NOT DETECTED Final   Proteus species NOT DETECTED NOT DETECTED Final   Salmonella species NOT DETECTED NOT DETECTED Final   Serratia marcescens NOT DETECTED NOT DETECTED Final   Haemophilus influenzae NOT DETECTED NOT DETECTED Final   Neisseria  meningitidis NOT DETECTED NOT DETECTED Final   Pseudomonas aeruginosa NOT DETECTED NOT DETECTED Final   Stenotrophomonas maltophilia NOT DETECTED NOT DETECTED Final   Candida albicans NOT DETECTED NOT DETECTED Final   Candida auris NOT DETECTED NOT DETECTED Final   Candida glabrata NOT DETECTED NOT DETECTED Final   Candida krusei NOT DETECTED NOT DETECTED Final   Candida parapsilosis NOT DETECTED NOT DETECTED Final   Candida tropicalis NOT DETECTED NOT DETECTED Final   Cryptococcus neoformans/gattii NOT DETECTED NOT DETECTED Final   CTX-M ESBL NOT DETECTED NOT DETECTED Final   Carbapenem resistance IMP NOT DETECTED NOT DETECTED Final   Carbapenem resistance KPC NOT DETECTED NOT DETECTED Final   Carbapenem resistance NDM NOT DETECTED NOT DETECTED Final   Carbapenem resist OXA 48 LIKE NOT DETECTED NOT DETECTED Final   Carbapenem resistance VIM NOT DETECTED NOT DETECTED Final    Comment: Performed at [facility], [address] [place], [place] [address]  Blood Culture (routine x 2)     Status: Abnormal   Collection Time: 12/15/23  4:30 PM   Specimen: BLOOD  Result Value Ref Range Status   Specimen Description   Final    BLOOD RIGHT ANTECUBITAL Performed at [facility], [address] [place], [place] [address]    Special Requests   Final    BOTTLES DRAWN AEROBIC AND ANAEROBIC Blood Culture results may not be optimal due to an inadequate volume of blood received in culture bottles Performed at [facility], [address] [place], [place] [address]    Culture  Setup Time   Final    GRAM NEGATIVE RODS IN BOTH AEROBIC AND ANAEROBIC BOTTLES CRITICAL VALUE NOTED.  VALUE IS CONSISTENT WITH PREVIOUSLY REPORTED AND CALLED VALUE. Performed at [facility], [address] [place], [place] [address]    Culture (A)  Final    ESCHERICHIA COLI SUSCEPTIBILITIES PERFORMED ON PREVIOUS CULTURE WITHIN THE LAST 5 DAYS. Performed at [facility], [address] [place], [place] [address]  Report Status 12/18/2023 FINAL  Final  Urine Culture (for pregnant, neutropenic or urologic patients or patients with an indwelling urinary catheter)     Status: None   Collection Time: 12/15/23  5:40 PM   Specimen: Urine, Clean Catch  Result Value Ref Range Status   Specimen Description   Final    URINE, CLEAN CATCH Performed at [facility], [address], [place], [place] [address]    Special Requests   Final    NONE Performed at [facility], [address] [place], [place] [address]    Culture   Final    NO GROWTH Performed at [facility], [address], [place], [place] [address]    Report Status 12/16/2023 FINAL  Final  MRSA Next Gen by PCR, Nasal     Status: None   Collection Time: 12/16/23  5:28 PM   Specimen: Nasal Mucosa; Nasal Swab  Result Value Ref Range Status   MRSA by PCR Next Gen NOT DETECTED NOT DETECTED Final    Comment: (NOTE) The GeneXpert MRSA Assay (FDA approved for NASAL specimens only), is one component of a comprehensive MRSA colonization surveillance program. It is not intended to diagnose MRSA infection nor to guide or monitor treatment for MRSA infections. Test performance is not FDA approved in patients less than 48 years old. Performed at [facility], [address] [place], [place] [address]     Labs: CBC: Recent Labs  Lab 12/18/23 [phone] 12/20/23 0114 12/21/23 0800 12/23/23 0405  WBC 16.2* 13.2* 13.0* 10.3  NEUTROABS 14.0* 10.9*  --   --   HGB 8.2* 8.7* 9.4* 8.7*  HCT 25.5* 28.5* 30.2* 28.2*  MCV 88.9 90.5 89.1 91.6  PLT 407* 439* 436* 391   Basic Metabolic Panel: Recent Labs  Lab 12/18/23 0442 12/20/23 0114 12/21/23 0800 12/23/23 0405  NA 139 137 136 137  K 3.7 2.9* 4.1 3.9  CL 99 95* 96* 96*  CO2 27 28 26 29   GLUCOSE 92 86 93 74  BUN 51* 41* 53* 33*  CREATININE 3.38* 2.92* 3.76* 3.09*  CALCIUM  8.8* 8.8* 8.8* 8.8*  MG  --   --   --   2.4  PHOS  --   --   --  5.1*   Liver Function Tests: Recent Labs  Lab 12/18/23 0442 12/20/23 0114 12/21/23 0800  AST 128* 91* 75*  ALT 38 28 21  ALKPHOS 775* 714* 717*  BILITOT 0.9 0.8 0.8  PROT 6.7 7.1 6.8  ALBUMIN 2.0* 2.2* 2.2*   CBG: Recent Labs  Lab 12/23/23 1214  GLUCAP 90    Discharge time spent: greater than 30 minutes.  Signed: Montey Apa, DO [facility] 12/24/2023

## 2023-12-24 NOTE — Progress Notes (Signed)
 Patient discharged to [facility]. Writer gave report to nurse Trula Gable [phone] at facility. Patient transported by ambulance via stretcher at this time. Patient has all belongings. Wound vac is disconnected per MD order.

## 2023-12-24 NOTE — Plan of Care (Signed)
  Problem: Clinical Measurements: Goal: Ability to maintain clinical measurements within normal limits will improve Outcome: Progressing   Problem: Pain Managment: Goal: General experience of comfort will improve and/or be controlled Outcome: Progressing   Problem: Safety: Goal: Ability to remain free from injury will improve Outcome: Progressing   Problem: Skin Integrity: Goal: Risk for impaired skin integrity will decrease Outcome: Progressing

## 2024-01-03 NOTE — Telephone Encounter (Signed)
 I have called the patient's number x 2, with no answer and VM not set up

## 2024-01-12 NOTE — Telephone Encounter (Signed)
Called pt no answer and no VM

## 2024-01-15 ENCOUNTER — Other Ambulatory Visit: Payer: Self-pay

## 2024-01-15 ENCOUNTER — Emergency Department

## 2024-01-15 ENCOUNTER — Inpatient Hospital Stay
Admission: EM | Admit: 2024-01-15 | Discharge: 2024-01-21 | DRG: 871 | Disposition: A | Discharge status: Hospice | Source: Skilled Nursing Facility | Attending: Internal Medicine | Admitting: Internal Medicine

## 2024-01-15 DIAGNOSIS — Z888 Allergy status to other drugs, medicaments and biological substances status: Secondary | ICD-10-CM

## 2024-01-15 DIAGNOSIS — G2581 Restless legs syndrome: Secondary | ICD-10-CM | POA: Diagnosis present

## 2024-01-15 DIAGNOSIS — Z789 Other specified health status: Secondary | ICD-10-CM | POA: Diagnosis not present

## 2024-01-15 DIAGNOSIS — K831 Obstruction of bile duct: Secondary | ICD-10-CM | POA: Diagnosis not present

## 2024-01-15 DIAGNOSIS — L8990 Pressure ulcer of unspecified site, unspecified stage: Secondary | ICD-10-CM | POA: Diagnosis not present

## 2024-01-15 DIAGNOSIS — A4151 Sepsis due to Escherichia coli [E. coli]: Principal | ICD-10-CM | POA: Diagnosis present

## 2024-01-15 DIAGNOSIS — R4182 Altered mental status, unspecified: Secondary | ICD-10-CM | POA: Diagnosis not present

## 2024-01-15 DIAGNOSIS — K7689 Other specified diseases of liver: Secondary | ICD-10-CM | POA: Diagnosis present

## 2024-01-15 DIAGNOSIS — E43 Unspecified severe protein-calorie malnutrition: Secondary | ICD-10-CM | POA: Diagnosis not present

## 2024-01-15 DIAGNOSIS — I132 Hypertensive heart and chronic kidney disease with heart failure and with stage 5 chronic kidney disease, or end stage renal disease: Secondary | ICD-10-CM | POA: Diagnosis present

## 2024-01-15 DIAGNOSIS — B952 Enterococcus as the cause of diseases classified elsewhere: Secondary | ICD-10-CM | POA: Diagnosis present

## 2024-01-15 DIAGNOSIS — I5030 Unspecified diastolic (congestive) heart failure: Secondary | ICD-10-CM | POA: Diagnosis not present

## 2024-01-15 DIAGNOSIS — K219 Gastro-esophageal reflux disease without esophagitis: Secondary | ICD-10-CM | POA: Diagnosis present

## 2024-01-15 DIAGNOSIS — M869 Osteomyelitis, unspecified: Secondary | ICD-10-CM | POA: Diagnosis not present

## 2024-01-15 DIAGNOSIS — I120 Hypertensive chronic kidney disease with stage 5 chronic kidney disease or end stage renal disease: Secondary | ICD-10-CM | POA: Diagnosis not present

## 2024-01-15 DIAGNOSIS — E8729 Other acidosis: Secondary | ICD-10-CM

## 2024-01-15 DIAGNOSIS — Z4901 Encounter for fitting and adjustment of extracorporeal dialysis catheter: Secondary | ICD-10-CM

## 2024-01-15 DIAGNOSIS — L89154 Pressure ulcer of sacral region, stage 4: Secondary | ICD-10-CM | POA: Diagnosis present

## 2024-01-15 DIAGNOSIS — Z515 Encounter for palliative care: Secondary | ICD-10-CM

## 2024-01-15 DIAGNOSIS — N2581 Secondary hyperparathyroidism of renal origin: Secondary | ICD-10-CM | POA: Diagnosis present

## 2024-01-15 DIAGNOSIS — G928 Other toxic encephalopathy: Secondary | ICD-10-CM | POA: Diagnosis not present

## 2024-01-15 DIAGNOSIS — R0603 Acute respiratory distress: Secondary | ICD-10-CM | POA: Diagnosis present

## 2024-01-15 DIAGNOSIS — K750 Abscess of liver: Secondary | ICD-10-CM | POA: Diagnosis present

## 2024-01-15 DIAGNOSIS — T8579XA Infection and inflammatory reaction due to other internal prosthetic devices, implants and grafts, initial encounter: Secondary | ICD-10-CM | POA: Diagnosis not present

## 2024-01-15 DIAGNOSIS — M4628 Osteomyelitis of vertebra, sacral and sacrococcygeal region: Secondary | ICD-10-CM | POA: Diagnosis present

## 2024-01-15 DIAGNOSIS — Z711 Person with feared health complaint in whom no diagnosis is made: Secondary | ICD-10-CM | POA: Diagnosis not present

## 2024-01-15 DIAGNOSIS — A414 Sepsis due to anaerobes: Secondary | ICD-10-CM | POA: Diagnosis not present

## 2024-01-15 DIAGNOSIS — Z931 Gastrostomy status: Secondary | ICD-10-CM

## 2024-01-15 DIAGNOSIS — N289 Disorder of kidney and ureter, unspecified: Secondary | ICD-10-CM | POA: Diagnosis not present

## 2024-01-15 DIAGNOSIS — L89812 Pressure ulcer of head, stage 2: Secondary | ICD-10-CM | POA: Diagnosis present

## 2024-01-15 DIAGNOSIS — G2401 Drug induced subacute dyskinesia: Secondary | ICD-10-CM | POA: Diagnosis present

## 2024-01-15 DIAGNOSIS — F419 Anxiety disorder, unspecified: Secondary | ICD-10-CM | POA: Diagnosis present

## 2024-01-15 DIAGNOSIS — Z466 Encounter for fitting and adjustment of urinary device: Secondary | ICD-10-CM

## 2024-01-15 DIAGNOSIS — R748 Abnormal levels of other serum enzymes: Secondary | ICD-10-CM | POA: Diagnosis not present

## 2024-01-15 DIAGNOSIS — R6520 Severe sepsis without septic shock: Secondary | ICD-10-CM | POA: Diagnosis present

## 2024-01-15 DIAGNOSIS — Z7982 Long term (current) use of aspirin: Secondary | ICD-10-CM

## 2024-01-15 DIAGNOSIS — I5032 Chronic diastolic (congestive) heart failure: Secondary | ICD-10-CM | POA: Diagnosis present

## 2024-01-15 DIAGNOSIS — K769 Liver disease, unspecified: Secondary | ICD-10-CM | POA: Diagnosis not present

## 2024-01-15 DIAGNOSIS — G40909 Epilepsy, unspecified, not intractable, without status epilepticus: Secondary | ICD-10-CM | POA: Diagnosis present

## 2024-01-15 DIAGNOSIS — Z803 Family history of malignant neoplasm of breast: Secondary | ICD-10-CM

## 2024-01-15 DIAGNOSIS — R569 Unspecified convulsions: Secondary | ICD-10-CM | POA: Diagnosis not present

## 2024-01-15 DIAGNOSIS — B9620 Unspecified Escherichia coli [E. coli] as the cause of diseases classified elsewhere: Secondary | ICD-10-CM | POA: Diagnosis present

## 2024-01-15 DIAGNOSIS — N186 End stage renal disease: Secondary | ICD-10-CM | POA: Diagnosis present

## 2024-01-15 DIAGNOSIS — G9341 Metabolic encephalopathy: Secondary | ICD-10-CM | POA: Diagnosis present

## 2024-01-15 DIAGNOSIS — E46 Unspecified protein-calorie malnutrition: Secondary | ICD-10-CM | POA: Diagnosis not present

## 2024-01-15 DIAGNOSIS — Z7189 Other specified counseling: Secondary | ICD-10-CM | POA: Diagnosis not present

## 2024-01-15 DIAGNOSIS — F319 Bipolar disorder, unspecified: Secondary | ICD-10-CM | POA: Diagnosis present

## 2024-01-15 DIAGNOSIS — Z853 Personal history of malignant neoplasm of breast: Secondary | ICD-10-CM

## 2024-01-15 DIAGNOSIS — Z8619 Personal history of other infectious and parasitic diseases: Secondary | ICD-10-CM

## 2024-01-15 DIAGNOSIS — Z452 Encounter for adjustment and management of vascular access device: Secondary | ICD-10-CM | POA: Diagnosis not present

## 2024-01-15 DIAGNOSIS — A419 Sepsis, unspecified organism: Secondary | ICD-10-CM | POA: Diagnosis present

## 2024-01-15 DIAGNOSIS — D631 Anemia in chronic kidney disease: Secondary | ICD-10-CM | POA: Diagnosis present

## 2024-01-15 DIAGNOSIS — R627 Adult failure to thrive: Secondary | ICD-10-CM | POA: Diagnosis present

## 2024-01-15 DIAGNOSIS — A4181 Sepsis due to Enterococcus: Secondary | ICD-10-CM | POA: Diagnosis not present

## 2024-01-15 DIAGNOSIS — Z66 Do not resuscitate: Secondary | ICD-10-CM | POA: Diagnosis not present

## 2024-01-15 DIAGNOSIS — A0472 Enterocolitis due to Clostridium difficile, not specified as recurrent: Secondary | ICD-10-CM | POA: Diagnosis present

## 2024-01-15 DIAGNOSIS — Z9049 Acquired absence of other specified parts of digestive tract: Secondary | ICD-10-CM

## 2024-01-15 DIAGNOSIS — E611 Iron deficiency: Secondary | ICD-10-CM | POA: Diagnosis present

## 2024-01-15 DIAGNOSIS — Z8673 Personal history of transient ischemic attack (TIA), and cerebral infarction without residual deficits: Secondary | ICD-10-CM

## 2024-01-15 DIAGNOSIS — Z992 Dependence on renal dialysis: Secondary | ICD-10-CM | POA: Diagnosis not present

## 2024-01-15 DIAGNOSIS — Z9011 Acquired absence of right breast and nipple: Secondary | ICD-10-CM

## 2024-01-15 DIAGNOSIS — Z79899 Other long term (current) drug therapy: Secondary | ICD-10-CM

## 2024-01-15 DIAGNOSIS — Z7401 Bed confinement status: Secondary | ICD-10-CM

## 2024-01-15 DIAGNOSIS — Z90710 Acquired absence of both cervix and uterus: Secondary | ICD-10-CM

## 2024-01-15 DIAGNOSIS — Z8614 Personal history of Methicillin resistant Staphylococcus aureus infection: Secondary | ICD-10-CM

## 2024-01-15 DIAGNOSIS — F17210 Nicotine dependence, cigarettes, uncomplicated: Secondary | ICD-10-CM | POA: Diagnosis present

## 2024-01-15 DIAGNOSIS — G9340 Encephalopathy, unspecified: Secondary | ICD-10-CM | POA: Diagnosis not present

## 2024-01-15 DIAGNOSIS — Z881 Allergy status to other antibiotic agents status: Secondary | ICD-10-CM

## 2024-01-15 DIAGNOSIS — R7881 Bacteremia: Secondary | ICD-10-CM | POA: Diagnosis not present

## 2024-01-15 DIAGNOSIS — I959 Hypotension, unspecified: Secondary | ICD-10-CM | POA: Diagnosis present

## 2024-01-15 DIAGNOSIS — N179 Acute kidney failure, unspecified: Secondary | ICD-10-CM | POA: Diagnosis not present

## 2024-01-15 DIAGNOSIS — L89159 Pressure ulcer of sacral region, unspecified stage: Secondary | ICD-10-CM

## 2024-01-15 DIAGNOSIS — Z8249 Family history of ischemic heart disease and other diseases of the circulatory system: Secondary | ICD-10-CM

## 2024-01-15 DIAGNOSIS — G40901 Epilepsy, unspecified, not intractable, with status epilepticus: Secondary | ICD-10-CM | POA: Diagnosis not present

## 2024-01-15 LAB — COMPREHENSIVE METABOLIC PANEL WITH GFR
ALT: 26 U/L (ref 0–44)
AST: 89 U/L — ABNORMAL HIGH (ref 15–41)
Albumin: 2.3 g/dL — ABNORMAL LOW (ref 3.5–5.0)
Alkaline Phosphatase: 862 U/L — ABNORMAL HIGH (ref 38–126)
Anion gap: 18 — ABNORMAL HIGH (ref 5–15)
BUN: 33 mg/dL — ABNORMAL HIGH (ref 8–23)
CO2: 21 mmol/L — ABNORMAL LOW (ref 22–32)
Calcium: 8.3 mg/dL — ABNORMAL LOW (ref 8.9–10.3)
Chloride: 95 mmol/L — ABNORMAL LOW (ref 98–111)
Creatinine, Ser: 3.1 mg/dL — ABNORMAL HIGH (ref 0.44–1.00)
GFR, Estimated: 16 mL/min — ABNORMAL LOW (ref >60)
Glucose, Bld: 148 mg/dL — ABNORMAL HIGH (ref 70–99)
Potassium: 3.7 mmol/L (ref 3.5–5.1)
Sodium: 134 mmol/L — ABNORMAL LOW (ref 135–145)
Total Bilirubin: 0.7 mg/dL (ref 0.0–1.2)
Total Protein: 7.4 g/dL (ref 6.5–8.1)

## 2024-01-15 LAB — CBC WITH DIFFERENTIAL/PLATELET
Abs Immature Granulocytes: 0.24 K/uL — ABNORMAL HIGH (ref 0.00–0.07)
Basophils Absolute: 0 K/uL (ref 0.0–0.1)
Basophils Relative: 0 %
Eosinophils Absolute: 0 K/uL (ref 0.0–0.5)
Eosinophils Relative: 0 %
HCT: 29.7 % — ABNORMAL LOW (ref 36.0–46.0)
Hemoglobin: 9.1 g/dL — ABNORMAL LOW (ref 12.0–15.0)
Immature Granulocytes: 1 %
Lymphocytes Relative: 3 %
Lymphs Abs: 0.6 K/uL — ABNORMAL LOW (ref 0.7–4.0)
MCH: 28.6 pg (ref 26.0–34.0)
MCHC: 30.6 g/dL (ref 30.0–36.0)
MCV: 93.4 fL (ref 80.0–100.0)
Monocytes Absolute: 0.5 K/uL (ref 0.1–1.0)
Monocytes Relative: 2 %
Neutro Abs: 20.3 K/uL — ABNORMAL HIGH (ref 1.7–7.7)
Neutrophils Relative %: 94 %
Platelets: 434 K/uL — ABNORMAL HIGH (ref 150–400)
RBC: 3.18 MIL/uL — ABNORMAL LOW (ref 3.87–5.11)
RDW: 16.1 % — ABNORMAL HIGH (ref 11.5–15.5)
WBC: 21.6 K/uL — ABNORMAL HIGH (ref 4.0–10.5)
nRBC: 0.1 % (ref 0.0–0.2)

## 2024-01-15 LAB — LIPASE, BLOOD: Lipase: 32 U/L (ref 11–51)

## 2024-01-15 LAB — LACTIC ACID, PLASMA
Lactic Acid, Venous: 6.1 mmol/L (ref 0.5–1.9)
Lactic Acid, Venous: 4.6 mmol/L (ref 0.5–1.9)

## 2024-01-15 LAB — MRSA NEXT GEN BY PCR, NASAL: MRSA by PCR Next Gen: NOT DETECTED

## 2024-01-15 LAB — PROTIME-INR
INR: 1.1 (ref 0.8–1.2)
Prothrombin Time: 14.7 s (ref 11.4–15.2)

## 2024-01-15 LAB — CBG MONITORING, ED: Glucose-Capillary: 110 mg/dL — ABNORMAL HIGH (ref 70–99)

## 2024-01-15 LAB — GLUCOSE, CAPILLARY: Glucose-Capillary: 115 mg/dL — ABNORMAL HIGH (ref 70–99)

## 2024-01-15 MED ORDER — CHLORHEXIDINE GLUCONATE CLOTH 2 % EX PADS
6.0000 | MEDICATED_PAD | Freq: Every day | CUTANEOUS | Status: DC
  Administered 2024-01-16: 6 via TOPICAL

## 2024-01-15 MED ORDER — METRONIDAZOLE 500 MG/100ML IV SOLN
500.0000 mg | Freq: Three times a day (TID) | INTRAVENOUS | Status: DC
  Administered 2024-01-15 (×2): 500 mg via INTRAVENOUS
  Filled 2024-01-15 (×3): qty 100

## 2024-01-15 MED ORDER — DAKINS (1/4 STRENGTH) 0.125 % EX SOLN
Freq: Every day | CUTANEOUS | Status: AC
  Administered 2024-01-16: 1
  Filled 2024-01-15: qty 473

## 2024-01-15 MED ORDER — ORAL CARE MOUTH RINSE
15.0000 mL | OROMUCOSAL | Status: DC | PRN
Start: 2024-01-15 — End: 2024-01-21

## 2024-01-15 MED ORDER — SODIUM CHLORIDE 0.9 % IV SOLN
1.0000 g | Freq: Three times a day (TID) | INTRAVENOUS | Status: DC
  Administered 2024-01-15: 1 g via INTRAVENOUS
  Filled 2024-01-15: qty 5
  Filled 2024-01-15: qty 1

## 2024-01-15 MED ORDER — FENTANYL CITRATE PF 50 MCG/ML IJ SOSY: 25.0000 ug | PREFILLED_SYRINGE | Freq: Once | INTRAMUSCULAR | Status: DC

## 2024-01-15 MED ORDER — SODIUM CHLORIDE 0.9 % IV SOLN
2.0000 g | Freq: Once | INTRAVENOUS | Status: AC
  Administered 2024-01-15: 2 g via INTRAVENOUS
  Filled 2024-01-15: qty 10

## 2024-01-15 MED ORDER — WHITE PETROLATUM EX OINT
TOPICAL_OINTMENT | Freq: Two times a day (BID) | CUTANEOUS | Status: DC
  Administered 2024-01-16 – 2024-01-20 (×9): 1 via TOPICAL
  Filled 2024-01-15 (×13): qty 5

## 2024-01-15 MED ORDER — DOCUSATE SODIUM 100 MG PO CAPS
100.0000 mg | ORAL_CAPSULE | Freq: Two times a day (BID) | ORAL | Status: DC | PRN
Start: 2024-01-15 — End: 2024-01-17

## 2024-01-15 MED ORDER — SODIUM CHLORIDE 0.9 % IV SOLN: 1.0000 g | Freq: Three times a day (TID) | INTRAVENOUS | Status: DC

## 2024-01-15 MED ORDER — VANCOMYCIN HCL IN DEXTROSE 1-5 GM/200ML-% IV SOLN
1000.0000 mg | Freq: Once | INTRAVENOUS | Status: AC
Start: 2024-01-15 — End: 2024-01-15
  Administered 2024-01-15: 1000 mg via INTRAVENOUS
  Filled 2024-01-15: qty 200

## 2024-01-15 MED ORDER — SODIUM CHLORIDE 0.9 % IV BOLUS
500.0000 mL | Freq: Once | INTRAVENOUS | Status: AC
  Administered 2024-01-15: 500 mL via INTRAVENOUS

## 2024-01-15 MED ORDER — ZINC OXIDE 40 % EX OINT
TOPICAL_OINTMENT | Freq: Two times a day (BID) | CUTANEOUS | Status: DC
  Administered 2024-01-16: 1 via TOPICAL
  Filled 2024-01-15: qty 113

## 2024-01-15 MED ORDER — BISACODYL 10 MG RE SUPP: 10.0000 mg | Freq: Once | RECTAL | Status: DC

## 2024-01-15 MED ORDER — POLYETHYLENE GLYCOL 3350 17 G PO PACK: 17.0000 g | PACK | Freq: Every day | ORAL | Status: DC | PRN

## 2024-01-15 NOTE — ED Triage Notes (Signed)
 Pt brought in by EMS from [facility] for AMS. Per staff at SNF, pt had dialysis earlier and upon return to facility pt was hypotensive and unresponsive. EMS states pt was foaming at the mouth and vomiting when they picked her up. Upon arrival to ED, pt responsive to voice - will open her eyes to voice - and is moaning. BP upon arrival 96/55.

## 2024-01-15 NOTE — Progress Notes (Signed)
 [facility]  ROUNDING NOTE   Subjective:   [name] was admitted to [facility] on 01/15/2024 for Resp distress  Last hemodialysis treatment was today. Post weight was 56.2kg, her dry weight is 56kg.   Patient received her complete hemodialysis treatment which was recently lowered to 3 hours. Patient was premedicated with haloperidol , gabapentin  and oxycodone  before her treatment.   Objective:  Vital signs in last 24 hours:  Pulse Rate:  [100] 100 (07/05 1200) Resp:  [16-17] 16 (07/05 1251) BP: (81-96)/(55-71) 81/71 (07/05 1251) SpO2:  [100 %] 100 % (07/05 1251) Weight:  [54.3 kg] 54.3 kg (07/05 1203)  Weight change:  Filed Weights   01/15/24 1203  Weight: 54.3 kg    Intake/Output: No intake/output data recorded.   Intake/Output this shift:  No intake/output data recorded.  Physical Exam: General: Ill appearing  Head: Normocephalic, atraumatic. Moist oral mucosal membranes  Eyes: Anicteric, PERRL  Neck: Supple, trachea midline  Lungs:  Clear to auscultation  Heart: Regular rate and rhythm  Abdomen:  Soft, nontender,   Extremities:  no peripheral edema.  Neurologic: Not following commands, altered mental status.   Skin: No lesions  Access: RIJ permcath    Basic Metabolic Panel: No results for input(s): NA, K, CL, CO2, GLUCOSE, BUN, CREATININE, CALCIUM , MG, PHOS in the last 168 hours.  Liver Function Tests: No results for input(s): AST, ALT, ALKPHOS, BILITOT, PROT, ALBUMIN in the last 168 hours. No results for input(s): LIPASE, AMYLASE in the last 168 hours. No results for input(s): AMMONIA in the last 168 hours.  CBC: No results for input(s): WBC, NEUTROABS, HGB, HCT, MCV, PLT in the last 168 hours.  Cardiac Enzymes: No results for input(s): CKTOTAL, CKMB, CKMBINDEX, TROPONINI in the last 168 hours.  BNP: Invalid input(s): POCBNP  CBG: Recent Labs  Lab 01/15/24 1235  GLUCAP 110*     Microbiology: Results for orders placed or performed during the hospital encounter of 12/15/23  Resp panel by RT-PCR (RSV, Flu A&B, Covid) Anterior Nasal Swab     Status: None   Collection Time: 12/15/23  4:11 PM   Specimen: Anterior Nasal Swab  Result Value Ref Range Status   SARS Coronavirus 2 by RT PCR NEGATIVE NEGATIVE Final    Comment: (NOTE) SARS-CoV-2 target nucleic acids are NOT DETECTED.  The SARS-CoV-2 RNA is generally detectable in upper respiratory specimens during the acute phase of infection. The lowest concentration of SARS-CoV-2 viral copies this assay can detect is 138 copies/mL. A negative result does not preclude SARS-Cov-2 infection and should not be used as the sole basis for treatment or other patient management decisions. A negative result may occur with  improper specimen collection/handling, submission of specimen other than nasopharyngeal swab, presence of viral mutation(s) within the areas targeted by this assay, and inadequate number of viral copies(<138 copies/mL). A negative result must be combined with clinical observations, patient history, and epidemiological information. The expected result is Negative.  Fact Sheet for Patients:  BloggerCourse.com  Fact Sheet for Healthcare Providers:  SeriousBroker.it  This test is no t yet approved or cleared by the [place]  FDA and  has been authorized for detection and/or diagnosis of SARS-CoV-2 by FDA under an Emergency Use Authorization (EUA). This EUA will remain  in effect (meaning this test can be used) for the duration of the COVID-19 declaration under Section 564(b)(1) of the Act, 21 U.S.C.section 360bbb-3(b)(1), unless the authorization is terminated  or revoked sooner.       Influenza  A by PCR NEGATIVE NEGATIVE Final   Influenza B by PCR NEGATIVE NEGATIVE Final    Comment: (NOTE) The Xpert Xpress SARS-CoV-2/FLU/RSV plus assay is  intended as an aid in the diagnosis of influenza from Nasopharyngeal swab specimens and should not be used as a sole basis for treatment. Nasal washings and aspirates are unacceptable for Xpert Xpress SARS-CoV-2/FLU/RSV testing.  Fact Sheet for Patients: BloggerCourse.com  Fact Sheet for Healthcare Providers: SeriousBroker.it  This test is not yet approved or cleared by the [place]  FDA and has been authorized for detection and/or diagnosis of SARS-CoV-2 by FDA under an Emergency Use Authorization (EUA). This EUA will remain in effect (meaning this test can be used) for the duration of the COVID-19 declaration under Section 564(b)(1) of the Act, 21 U.S.C. section 360bbb-3(b)(1), unless the authorization is terminated or revoked.     Resp Syncytial Virus by PCR NEGATIVE NEGATIVE Final    Comment: (NOTE) Fact Sheet for Patients: BloggerCourse.com  Fact Sheet for Healthcare Providers: SeriousBroker.it  This test is not yet approved or cleared by the [place]  FDA and has been authorized for detection and/or diagnosis of SARS-CoV-2 by FDA under an Emergency Use Authorization (EUA). This EUA will remain in effect (meaning this test can be used) for the duration of the COVID-19 declaration under Section 564(b)(1) of the Act, 21 U.S.C. section 360bbb-3(b)(1), unless the authorization is terminated or revoked.  Performed at [facility], [address] [place], [place] [address]   Blood Culture (routine x 2)     Status: Abnormal   Collection Time: 12/15/23  4:12 PM   Specimen: BLOOD  Result Value Ref Range Status   Specimen Description   Final    BLOOD RIGHT ANTECUBITAL Performed at [facility], [address] [place], [place] [address]    Special Requests   Final    BOTTLES DRAWN AEROBIC AND ANAEROBIC Blood Culture results may not be optimal due  to an inadequate volume of blood received in culture bottles Performed at [facility], [address] [place], [place] [address]    Culture  Setup Time   Final    GRAM NEGATIVE RODS IN BOTH AEROBIC AND ANAEROBIC BOTTLES Organism ID to follow CRITICAL RESULT CALLED TO, READ BACK BY AND VERIFIED WITH:  JASON ROBINS AT 9470 12/16/23 JG Performed at [facility], [address] [place], [place] [address]    Culture ESCHERICHIA COLI (A)  Final   Report Status 12/18/2023 FINAL  Final   Organism ID, Bacteria ESCHERICHIA COLI  Final   Organism ID, Bacteria ESCHERICHIA COLI  Final      Susceptibility   Escherichia coli - KIRBY BAUER*    CEFAZOLIN  RESISTANT Resistant    Escherichia coli - MIC*    AMPICILLIN >=32 RESISTANT Resistant     CEFEPIME  <=0.12 SENSITIVE Sensitive     CEFTAZIDIME <=1 SENSITIVE Sensitive     CEFTRIAXONE  <=0.25 SENSITIVE Sensitive     CIPROFLOXACIN >=4 RESISTANT Resistant     GENTAMICIN <=1 SENSITIVE Sensitive     IMIPENEM <=0.25 SENSITIVE Sensitive     TRIMETH /SULFA  <=20 SENSITIVE Sensitive     AMPICILLIN/SULBACTAM >=32 RESISTANT Resistant     PIP/TAZO <=4 SENSITIVE Sensitive ug/mL    * ESCHERICHIA COLI    ESCHERICHIA COLI  Aerobic/Anaerobic Culture w Gram Stain (surgical/deep wound)     Status: None   Collection Time: 12/15/23  4:12 PM   Specimen: Wound  Result Value Ref Range Status   Specimen Description  Final    WOUND Performed at [facility], [address], [place], [place] [address]    Special Requests   Final    SACRAL Performed at [facility], [address] [place], [place] [address]    Gram Stain NO WBC SEEN RARE GRAM NEGATIVE RODS   Final   Culture   Final    ABUNDANT KLEBSIELLA PNEUMONIAE FEW ESCHERICHIA COLI FEW BACTEROIDES FRAGILIS BETA LACTAMASE POSITIVE FEW ENTEROCOCCUS FAECALIS FEW CORYNEBACTERIUM STRIATUM Standardized susceptibility testing for this organism is not available. Performed  at [facility], [address], [place], [place] [address]    Report Status 12/19/2023 FINAL  Final   Organism ID, Bacteria KLEBSIELLA PNEUMONIAE  Final   Organism ID, Bacteria ESCHERICHIA COLI  Final   Organism ID, Bacteria ENTEROCOCCUS FAECALIS  Final      Susceptibility   Escherichia coli - MIC*    AMPICILLIN >=32 RESISTANT Resistant     CEFEPIME  <=0.12 SENSITIVE Sensitive     CEFTAZIDIME <=1 SENSITIVE Sensitive     CEFTRIAXONE  <=0.25 SENSITIVE Sensitive     CIPROFLOXACIN >=4 RESISTANT Resistant     GENTAMICIN <=1 SENSITIVE Sensitive     IMIPENEM <=0.25 SENSITIVE Sensitive     TRIMETH /SULFA  <=20 SENSITIVE Sensitive     AMPICILLIN/SULBACTAM >=32 RESISTANT Resistant     PIP/TAZO <=4 SENSITIVE Sensitive ug/mL    * FEW ESCHERICHIA COLI   Enterococcus faecalis - MIC*    AMPICILLIN <=2 SENSITIVE Sensitive     VANCOMYCIN  >=32 RESISTANT Resistant     GENTAMICIN SYNERGY SENSITIVE Sensitive     * FEW ENTEROCOCCUS FAECALIS   Klebsiella pneumoniae - MIC*    AMPICILLIN RESISTANT Resistant     CEFEPIME  <=0.12 SENSITIVE Sensitive     CEFTAZIDIME <=1 SENSITIVE Sensitive     CEFTRIAXONE  <=0.25 SENSITIVE Sensitive     CIPROFLOXACIN <=0.25 SENSITIVE Sensitive     GENTAMICIN <=1 SENSITIVE Sensitive     IMIPENEM <=0.25 SENSITIVE Sensitive     TRIMETH /SULFA  <=20 SENSITIVE Sensitive     AMPICILLIN/SULBACTAM <=2 SENSITIVE Sensitive     PIP/TAZO <=4 SENSITIVE Sensitive ug/mL    * ABUNDANT KLEBSIELLA PNEUMONIAE  Blood Culture ID Panel (Reflexed)     Status: Abnormal   Collection Time: 12/15/23  4:12 PM  Result Value Ref Range Status   Enterococcus faecalis NOT DETECTED NOT DETECTED Final   Enterococcus Faecium NOT DETECTED NOT DETECTED Final   Listeria monocytogenes NOT DETECTED NOT DETECTED Final   Staphylococcus species NOT DETECTED NOT DETECTED Final   Staphylococcus aureus (BCID) NOT DETECTED NOT DETECTED Final   Staphylococcus epidermidis NOT DETECTED NOT DETECTED Final    Staphylococcus lugdunensis NOT DETECTED NOT DETECTED Final   Streptococcus species NOT DETECTED NOT DETECTED Final   Streptococcus agalactiae NOT DETECTED NOT DETECTED Final   Streptococcus pneumoniae NOT DETECTED NOT DETECTED Final   Streptococcus pyogenes NOT DETECTED NOT DETECTED Final   A.calcoaceticus-baumannii NOT DETECTED NOT DETECTED Final   Bacteroides fragilis NOT DETECTED NOT DETECTED Final   Enterobacterales DETECTED (A) NOT DETECTED Final    Comment: Enterobacterales represent a large order of gram negative bacteria, not a single organism. CRITICAL RESULT CALLED TO, READ BACK BY AND VERIFIED WITH:  JASON ROBINS AT 9470 12/16/23 JG    Enterobacter cloacae complex NOT DETECTED NOT DETECTED Final   Escherichia coli DETECTED (A) NOT DETECTED Final    Comment: CRITICAL RESULT CALLED TO, READ BACK BY AND VERIFIED WITH:  JASON ROBINS AT 0529 12/16/23 JG  Klebsiella aerogenes NOT DETECTED NOT DETECTED Final   Klebsiella oxytoca NOT DETECTED NOT DETECTED Final   Klebsiella pneumoniae NOT DETECTED NOT DETECTED Final   Proteus species NOT DETECTED NOT DETECTED Final   Salmonella species NOT DETECTED NOT DETECTED Final   Serratia marcescens NOT DETECTED NOT DETECTED Final   Haemophilus influenzae NOT DETECTED NOT DETECTED Final   Neisseria meningitidis NOT DETECTED NOT DETECTED Final   Pseudomonas aeruginosa NOT DETECTED NOT DETECTED Final   Stenotrophomonas maltophilia NOT DETECTED NOT DETECTED Final   Candida albicans NOT DETECTED NOT DETECTED Final   Candida auris NOT DETECTED NOT DETECTED Final   Candida glabrata NOT DETECTED NOT DETECTED Final   Candida krusei NOT DETECTED NOT DETECTED Final   Candida parapsilosis NOT DETECTED NOT DETECTED Final   Candida tropicalis NOT DETECTED NOT DETECTED Final   Cryptococcus neoformans/gattii NOT DETECTED NOT DETECTED Final   CTX-M ESBL NOT DETECTED NOT DETECTED Final   Carbapenem resistance IMP NOT DETECTED NOT DETECTED Final   Carbapenem  resistance KPC NOT DETECTED NOT DETECTED Final   Carbapenem resistance NDM NOT DETECTED NOT DETECTED Final   Carbapenem resist OXA 48 LIKE NOT DETECTED NOT DETECTED Final   Carbapenem resistance VIM NOT DETECTED NOT DETECTED Final    Comment: Performed at [facility], [address] [place], [place] [address]  Blood Culture (routine x 2)     Status: Abnormal   Collection Time: 12/15/23  4:30 PM   Specimen: BLOOD  Result Value Ref Range Status   Specimen Description   Final    BLOOD RIGHT ANTECUBITAL Performed at [facility], [address] [place], [place] [address]    Special Requests   Final    BOTTLES DRAWN AEROBIC AND ANAEROBIC Blood Culture results may not be optimal due to an inadequate volume of blood received in culture bottles Performed at [facility], [address] [place], [place] [address]    Culture  Setup Time   Final    GRAM NEGATIVE RODS IN BOTH AEROBIC AND ANAEROBIC BOTTLES CRITICAL VALUE NOTED.  VALUE IS CONSISTENT WITH PREVIOUSLY REPORTED AND CALLED VALUE. Performed at [facility], [address] [place], [place] [address]    Culture (A)  Final    ESCHERICHIA COLI SUSCEPTIBILITIES PERFORMED ON PREVIOUS CULTURE WITHIN THE LAST 5 DAYS. Performed at [facility], [address], [place], [place] [address]    Report Status 12/18/2023 FINAL  Final  Urine Culture (for pregnant, neutropenic or urologic patients or patients with an indwelling urinary catheter)     Status: None   Collection Time: 12/15/23  5:40 PM   Specimen: Urine, Clean Catch  Result Value Ref Range Status   Specimen Description   Final    URINE, CLEAN CATCH Performed at [facility], [address] [place], [place] [address]    Special Requests   Final    NONE Performed at [facility], [address] [place], [place] [address]    Culture   Final    NO GROWTH Performed at [facility], [address]  [place], [place] [address]    Report Status 12/16/2023 FINAL  Final  MRSA Next Gen by PCR, Nasal     Status: None   Collection Time: 12/16/23  5:28 PM   Specimen: Nasal Mucosa; Nasal Swab  Result Value Ref Range Status   MRSA by PCR Next Gen NOT DETECTED NOT DETECTED Final    Comment: (NOTE) The GeneXpert  MRSA Assay (FDA approved for NASAL specimens only), is one component of a comprehensive MRSA colonization surveillance program. It is not intended to diagnose MRSA infection nor to guide or monitor treatment for MRSA infections. Test performance is not FDA approved in patients less than 27 years old. Performed at [facility], [address] [place], [place] [address]     Coagulation Studies: No results for input(s): LABPROT, INR in the last 72 hours.  Urinalysis: No results for input(s): COLORURINE, LABSPEC, PHURINE, GLUCOSEU, HGBUR, BILIRUBINUR, KETONESUR, PROTEINUR, UROBILINOGEN, NITRITE, LEUKOCYTESUR in the last 72 hours.  Invalid input(s): APPERANCEUR    Imaging: No results found.   Medications:       Assessment/ Plan:  [name] is a 68 y.o.  female with end stage renal disease on hemodialysis, seizure disorders, congestive heart failure, depression, bipolar disorder, and breast cancer who is admitted to [facility] on 01/15/2024 for Resp distress  [facility] TTS [facility] RIJ permcath [phone].   End Stage Renal Disease: on hemodialysis. Completed hemodialysis treatment today.  - TTS schedule.  Hypotension: concern for sepsis versus overmedication versus seizure - appreciate ICU input.  - responsive to IV fluids  Anemia with chronic kidney disease: on Mircera as outpatient.   Secondary Hyperparathyroidism: sevelamer  with meals.    LOS: 0 Sherry [name] 7/5/20251:18 PM

## 2024-01-15 NOTE — Consult Note (Addendum)
 WOC Nurse Consult Note: patient is known to WOC team from previous admission; was followed for sacral wound at that time and utilized NPWT; question of osteomyelitis at this time Reason for Consult: sacral and R ear wounds  Wound type: 1.  Stage 4 Sacral Pressure Injury 60% red 40% tan slough, some dark hemorrhagic tissue noted  2.  R ear Stage 2 Pressure Injury pink dry with adjacent area of eschar  Pressure Injury POA: Yes Measurement: see nursing flowsheet  Wound bed:as above  Drainage (amount, consistency, odor) per nursing flowsheet, no foul smell per bedside nurse  Periwound: scattered areas of pink tissue  Dressing procedure/placement/frequency:  Cleanse sacral wound with Vashe wound cleanser Soila [id number]) do not rinse and allow to air dry. Using a Q tip applicator apply Dakin's moistened gauze to wound bed daily x 3 days making sure to cover any areas of depth and undermining  Cover with dry gauze and secure with ABD pad and tape or silicone foam whichever is preferred. After 3 days Dakins will write for Vashe wet to dry (begins 01/18/2024)  Cleanse R ear wound daily with NS and apply Vaseline to wound beds 2 times daily. May leave open to air and keep pressure off area.    POC discussed with bedside nurse. Patient should be placed on a low air loss mattress if moved out of ICU setting d/t Stage 4 Pressure Injury.    Surgery is to evaluate wound and any wound care orders placed by them supercede wound care orders placed by [facility] RN.   WOC team will not follow Re-consult if further needs arise.   Thank you,    Powell Bar MSN, RN-BC, [employer]

## 2024-01-15 NOTE — Progress Notes (Signed)
 PHARMACY CONSULT NOTE - FOLLOW UP  Pharmacy Consult for Electrolyte Monitoring and Replacement   Recent Labs: Potassium (mmol/L)  Date Value  01/15/2024 3.7   Magnesium  (mg/dL)  Date Value  93/87/7974 2.4   Calcium  (mg/dL)  Date Value  92/94/7974 8.3 (L)   Albumin (g/dL)  Date Value  92/94/7974 2.3 (L)  07/25/2020 4.2   Phosphorus (mg/dL)  Date Value  93/87/7974 5.1 (H)   Sodium (mmol/L)  Date Value  01/15/2024 134 (L)  07/25/2020 141     Assessment:  68 y.o.  female with end stage renal disease on hemodialysis, seizure disorders, congestive heart failure, depression, bipolar disorder, and breast cancer who is admitted to [facility] on 01/15/2024 for Resp distress. Pharmacy is asked to follow and replace electrolytes while in CCU   Goal of Therapy:  Electrolytes WNL  Plan:  ---no electrolyte replacement warranted ---recheck electrolytes again in am  Adriana JONETTA Bolster ,PharmD Clinical Pharmacist 01/15/2024 2:29 PM

## 2024-01-15 NOTE — Sepsis Progress Note (Addendum)
 Elink following code sepsis  1347- MD and bedside RN messaged, lactic 6.1, recommended fluids based on weight is . So far 1L total is ordered, asked MD if he wants to order additional fluids to get to recommended volume- Md responded no plans to give additional fluids at this time  1548 messaged bedside RN to notify repeat lactic is needed

## 2024-01-15 NOTE — ED Provider Notes (Signed)
 [facility] Provider Note    Event Date/Time   First MD Initiated Contact with Patient 01/15/24 1224     (approximate)   History   Altered Mental Status  EM caveat altered mental status  HPI  Sherry [name] is a 68 y.o. female e extensive xtensive past medical history but notable also end-stage renal disease on hemodialysis.  Recent sepsis  EMS reports patient attended hemodialysis, upon returning to her facility she was found to be unresponsive and hypotensive.  EMS is noted significant hypotension, unable to obtain IV access but glucose reassuring.  They also report the patient vomited once       Physical Exam   Triage Vital Signs: ED Triage Vitals  Encounter Vitals Group     BP 01/15/24 1200 (!) 96/55     Girls Systolic BP Percentile --      Girls Diastolic BP Percentile --      Boys Systolic BP Percentile --      Boys Diastolic BP Percentile --      Pulse Rate 01/15/24 1200 100     Resp 01/15/24 1200 17     Temp --      Temp src --      SpO2 --      Weight 01/15/24 1203 119 lb 11.4 oz (54.3 kg)     Height 01/15/24 1203 5' 2 (1.575 m)     Head Circumference --      Peak Flow --      Pain Score --      Pain Loc --      Pain Education --      Exclude from Growth Chart --     Most recent vital signs: Vitals:   01/15/24 1815 01/15/24 1900  BP:  130/69  Pulse: 80 81  Resp: 20 17  Temp:    SpO2: 100% 100%     General: Ill-appearing.  Initially very lethargic, but with laying flat and improvement in blood pressure she opens eyes   Limited ability to conduct focused neurologic exam but she is able to demonstrate very slight wiggle of all extremities and does not withdraw to pain with no obvious focal deficits.  She does keep her head turned to the right throughout the majority of examination.  Normocephalic atraumatic.  Critically ill-appearing with recent emesis noted on the right side of her shirt but no active vomiting.  She is  managing airway well but seems to have slowed capillary refill poor nailbed perfusion.  Placed on nonrebreather due to concerns of potential underlying hypoxia but later able to to ascertain with pulse oximetry saturation of 100%  CV:  Good peripheral perfusion.  Mild tachycardia Resp:  Normal effort.  Lung sounds are clear and unlabored. Abd:  No distention.  No frank distention.  No obvious rebound or guarding. Other:    Of note, assessment is fairly limited by patient's altered mental status   ED Results / Procedures / Treatments   Labs (all labs ordered are listed, but only abnormal results are displayed) Labs Reviewed  LACTIC ACID, PLASMA - Abnormal; Notable for the following components:      Result Value   Lactic Acid, Venous 6.1 (*)    All other components within normal limits  LACTIC ACID, PLASMA - Abnormal; Notable for the following components:   Lactic Acid, Venous 4.6 (*)    All other components within normal limits  CBC WITH DIFFERENTIAL/PLATELET - Abnormal; Notable for the following components:  WBC 21.6 (*)    RBC 3.18 (*)    Hemoglobin 9.1 (*)    HCT 29.7 (*)    RDW 16.1 (*)    Platelets 434 (*)    Neutro Abs 20.3 (*)    Lymphs Abs 0.6 (*)    Abs Immature Granulocytes 0.24 (*)    All other components within normal limits  COMPREHENSIVE METABOLIC PANEL WITH GFR - Abnormal; Notable for the following components:   Sodium 134 (*)    Chloride 95 (*)    CO2 21 (*)    Glucose, Bld 148 (*)    BUN 33 (*)    Creatinine, Ser 3.10 (*)    Calcium  8.3 (*)    Albumin 2.3 (*)    AST 89 (*)    Alkaline Phosphatase 862 (*)    GFR, Estimated 16 (*)    Anion gap 18 (*)    All other components within normal limits  GLUCOSE, CAPILLARY - Abnormal; Notable for the following components:   Glucose-Capillary 115 (*)    All other components within normal limits  CBG MONITORING, ED - Abnormal; Notable for the following components:   Glucose-Capillary 110 (*)    All other  components within normal limits  MRSA NEXT GEN BY PCR, NASAL  CULTURE, BLOOD (ROUTINE X 2)  CULTURE, BLOOD (ROUTINE X 2)  PROTIME-INR  LIPASE, BLOOD  CBC WITH DIFFERENTIAL/PLATELET  URINALYSIS, W/ REFLEX TO CULTURE (INFECTION SUSPECTED)  RENAL FUNCTION PANEL  CBC  MAGNESIUM      EKG  Interpreted by me at 1250 Heart rate 95.  Normal sinus rhythm, no evidence of frank ischemia or STEMI.  Mild prolongation of QT   RADIOLOGY  CT ABDOMEN PELVIS WO CONTRAST Result Date: 01/15/2024 CLINICAL DATA:  Abdomen pain unresponsive EXAM: CT ABDOMEN AND PELVIS WITHOUT CONTRAST TECHNIQUE: Multidetector CT imaging of the abdomen and pelvis was performed following the standard protocol without IV contrast. RADIATION DOSE REDUCTION: This exam was performed according to the departmental dose-optimization program which includes automated exposure control, adjustment of the mA and/or kV according to patient size and/or use of iterative reconstruction technique. COMPARISON:  MRI 12/16/2023, CT 12/15/2023, 04/06/2023 FINDINGS: Lower chest: Lung bases demonstrate scarring or atelectasis at the lung bases. Partially visualized catheter in the right atrium. Trace pericardial effusion. Mitral calcification. Hepatobiliary: Interim placement of biliary stent, proximal part of the stent is in the common bile duct, distal portion of the stent is in the third portion of duodenum. There is residual intra hepatic biliary dilatation. Focal ill-defined hypodense areas within the periphery of the liver, most evident in the right hepatic lobe, measuring up to 2.3 cm by 4.9 cm inferiorly on the right, series 2, image 33. Common bile duct diameter 2.2 cm, air in the common bile duct. Pancreas: Unremarkable. No pancreatic ductal dilatation or surrounding inflammatory changes. Spleen: Normal in size without focal abnormality. Adrenals/Urinary Tract: Right adrenal gland is normal. 2 cm left adrenal nodule, stable and presumably representing  adenoma. Atrophic kidneys without hydronephrosis. Renal cysts for which no imaging follow-up is recommended. Small hyperdense subcentimeter lesions at the mid left kidney too small to further characterize, no specific imaging follow-up is recommended. Thick walled appearance of the urinary bladder Stomach/Bowel: Gastrostomy tube with tip in the body of the stomach. Intra-abdominal portion of tubing appears to course along the anterior wall of the splenic flexure with some haziness around tubing but no frank perforation of the colon. There is no dilated small bowel. Colon is dilated and contains  both fluid and large stool mixed with high density material. There may be some colon wall thickening at the rectosigmoid colon. Vascular/Lymphatic: Aortic atherosclerosis. No enlarged abdominal or pelvic lymph nodes. Reproductive: Hysterectomy.  No adnexal mass Other: No free air. No significant ascites. Presacral edema, small volume fluid, and stranding. Musculoskeletal: Posterior spinal fusion hardware with interbody device at L4-L5. Chronic irregular degenerative change at L5-S1. Sacral coccygeal decubitus ulcer extends down to the bone. Mild erosive change at the sacrococcygeal region concerning for osteomyelitis. Coccygeal segments are displaced anteriorly into the pelvis, posterior to the rectum with surrounding fluid, sagittal series 6, image 65. Generalized subcutaneous edema. Evidence of prior ventral hernia repair. Chronic advanced degenerative change of the right hip with suspected AVN and mild fragmentation of femoral head. IMPRESSION: 1. Interim placement of biliary stent with residual intra and extrahepatic biliary dilatation. Ill-defined hypodense areas within the periphery of the liver, most evident in the right hepatic lobe, measuring up to 4.9 cm inferiorly on the right. Findings appear progressive compared with the CT from 12/15/2023, and as suggested on the interval MRI, could represent progressively  dilated biliary radicles versus intra hepatic abscesses, underlying neoplastic process not excluded. 2. Sacral coccygeal decubitus ulcer extends down to the bone. Mild erosive change at the sacrococcygeal region concerning for osteomyelitis. Coccygeal bone segments/fragments are displaced anteriorly into the pelvis, posterior to the rectum with surrounding fluid. 3. Gastrostomy tube with tip in the body of the stomach. Intra-abdominal portion of tubing appears to course along the anterior wall of the splenic flexure with some haziness around tubing but no frank perforation of the colon. 4. Dilated colon containing both fluid and large stool mixed with high density material. Large fecal impaction at the rectum. There may be some colon wall thickening at the rectosigmoid colon, question colitis. 5. Decompressed thick walled appearance of the urinary bladder, question cystitis. 6. Atrophic kidneys. 7. 2 cm left adrenal nodule, stable and presumably representing adenoma. 8. Aortic atherosclerosis. Aortic Atherosclerosis (ICD10-I70.0). Electronically Signed   By: Luke Bun M.D.   On: 01/15/2024 15:30   CT Head Wo Contrast Result Date: 01/15/2024 CLINICAL DATA:  Delirium hypotensive unresponsive EXAM: CT HEAD WITHOUT CONTRAST TECHNIQUE: Contiguous axial images were obtained from the base of the skull through the vertex without intravenous contrast. RADIATION DOSE REDUCTION: This exam was performed according to the departmental dose-optimization program which includes automated exposure control, adjustment of the mA and/or kV according to patient size and/or use of iterative reconstruction technique. COMPARISON:  CT brain 12/15/2023, MRI 09/20/2023 FINDINGS: Brain: No acute territorial infarction, hemorrhage or intracranial mass. Atrophy and moderate advanced chronic small vessel ischemic changes of the white matter. Stable ventricle size. Small chronic infarcts in the left basal ganglia and cerebellum. Vascular: No  hyperdense vessels.  Carotid vascular calcification Skull: Partially excluded right frontal bone.  No obvious fracture Sinuses/Orbits: Partially excluded right orbit. No obvious acute abnormality Other: None IMPRESSION: 1. No CT evidence for acute intracranial abnormality. 2. Atrophy and chronic small vessel ischemic changes of the white matter. Small chronic infarcts in the left basal ganglia and cerebellum. Electronically Signed   By: Luke Bun M.D.   On: 01/15/2024 15:05   DG Chest Port 1 View Result Date: 01/15/2024 CLINICAL DATA:  Hypotensive in unresponsive. Evaluate for abnormality. EXAM: PORTABLE CHEST 1 VIEW COMPARISON:  CT 12/15/2023 FINDINGS: There is a right chest wall dialysis catheter with tips in the right atrium. Heart size is normal. Aortic atherosclerotic calcifications. Linear scar versus platelike atelectasis  noted within the lung bases. No pleural effusion, interstitial edema or consolidative change. Postop change from ACDF in the lower cervical spine. IMPRESSION: 1. Bibasilar scar versus platelike atelectasis. Electronically Signed   By: Waddell Calk M.D.   On: 01/15/2024 13:17    Chest x-ray interpreted by me as negative for acute infiltrate or pneumothorax or volume overload or obvious finding.  CT head without acute finding.  Abdominal CT with multiple findings, discussed briefly with Dr. Malka, ICU team aware and actively managing concerns for possible hepatobiliary, osteomyelitis etc.  PROCEDURES:  Critical Care performed: Yes, see critical care procedure note(s)  Angiocath insertion Performed by: Oneil Budge  Consent:  consent implied/emergent   Preparation: Patient was prepped with IV anticeptic swab in the usual sterile fashion.  Vein Location: LEFT antecubital  Ultrasound Guided  Gauge:   Normal blood return but UNABLE to threat into vein. UNSUCCESFUL attempt 1x by me Patient tolerance: Patient tolerated the procedure well with no immediate  complications. IV tourniquet removed without incident.   CRITICAL CARE Performed by: Oneil Budge   Total critical care time: 35 minutes  Critical care time was exclusive of separately billable procedures and treating other patients.  Critical care was necessary to treat or prevent imminent or life-threatening deterioration.  Critical care was time spent personally by me on the following activities: development of treatment plan with patient and/or surrogate as well as nursing, discussions with consultants, evaluation of patient's response to treatment, examination of patient, obtaining history from patient or surrogate, ordering and performing treatments and interventions, ordering and review of laboratory studies, ordering and review of radiographic studies, pulse oximetry and re-evaluation of patient's condition.   Procedures   MEDICATIONS ORDERED IN ED: Medications  fentaNYL  (SUBLIMAZE ) injection 25 mcg (has no administration in time range)  docusate sodium  (COLACE) capsule 100 mg (has no administration in time range)  polyethylene glycol (MIRALAX  / GLYCOLAX ) packet 17 g (has no administration in time range)  metroNIDAZOLE  (FLAGYL ) IVPB 500 mg ( Intravenous Infusion Verify 01/15/24 1646)  aztreonam  (AZACTAM ) 1 g in sodium chloride  0.9 % 100 mL IVPB (has no administration in time range)  Chlorhexidine  Gluconate Cloth 2 % PADS 6 each (has no administration in time range)  bisacodyl  (DULCOLAX) suppository 10 mg (10 mg Rectal Not Given 01/15/24 1612)  Oral care mouth rinse (has no administration in time range)  sodium hypochlorite (DAKIN'S 1/4 STRENGTH) topical solution (has no administration in time range)  liver oil-zinc  oxide (DESITIN) 40 % ointment (has no administration in time range)  white petrolatum  (VASELINE) gel (has no administration in time range)  sodium chloride  0.9 % bolus 500 mL (500 mLs Intravenous New Bag/Given 01/15/24 1252)  sodium chloride  0.9 % bolus 500 mL (  Intravenous Infusion Verify 01/15/24 1646)  aztreonam  (AZACTAM ) 2 g in sodium chloride  0.9 % 100 mL IVPB (0 g Intravenous Stopped 01/15/24 1600)  vancomycin  (VANCOCIN ) IVPB 1000 mg/200 mL premix ( Intravenous Infusion Verify 01/15/24 1646)     IMPRESSION / MDM / ASSESSMENT AND PLAN / ED COURSE  I reviewed the triage vital signs and the nursing notes.                              Differential diagnosis includes, but is not limited to, severe sepsis especially in the setting of significant leukocytosis on recent past medical history, hypovolemia, shock, less likely felt to be central neurologic and more suspect patient with more of  a multifactorial encephalopathy.  Her differential diagnosis is extremely broad but given her leukocytosis hypotension and presentation code sepsis initiated.  Significantly elevated lactic acid possibly in the setting of sepsis or other cause such as ischemia.  Consulted with our intensive care unit team, they saw and evaluated and assisted with care management in the ED and will be following up on imaging.  The patient is obviously critically ill.  Broad-spectrum antibiotic coverage ordered with particular attention paid to previous noted positive E. coli infection.      Patient's presentation is most consistent with acute presentation with potential threat to life or bodily function.   The patient is on the cardiac monitor to evaluate for evidence of arrhythmia and/or significant heart rate changes.  Clinical Course as of 01/15/24 2014  Sat Jan 15, 2024  1325 Blood pressure now improving to 101/59 with MAP of 74.  Oxygen saturation 100%.  Have consulted with CCM Dr. Malka for anticipated ICU consult needs.  Patient's alertness is improving.  She reports having body aches all over at this time.  Denies any specific pain such as headache chest pain difficulty breathing but reports hurts everywhere [MQ]  1333 Patient showing improvement in blood pressure to 101  systolic, has dropped down slightly now 91/67 with MAP of 78.  Dr. Askaar coming for consult now.  Leukocytosis is notable, patient with known risk factors for bacteremia including recent UTI with E. coli.  Initiate broad-spectrum antibiotics code sepsis initiated. [MQ]    Clinical Course User Index [MQ] Dicky Anes, MD   Sepsis reassessment performed.  Lactic acid felt to be elevated due to increased metabolic demand, hypotension possible ischemia and or sepsis severe in nature.  She is responding well to fluid resuscitation.  Given her history of end-stage renal disease, and excellent improvement in hemodynamics I have given her 1 L of normal saline and she has shown excellent fluid responsiveness with improvement in capillary refill and mental status.  I did not give a full 30 mL/kg of IV fluid resuscitation at this point in the ER, further management involving the ICU team  FINAL CLINICAL IMPRESSION(S) / ED DIAGNOSES   Final diagnoses:  Altered mental status, unspecified altered mental status type  Sepsis, due to unspecified organism, unspecified whether acute organ dysfunction present (HCC)  Osteomyelitis, unspecified site, unspecified type (HCC)  Pressure injury of skin, unspecified injury stage, unspecified location     Rx / DC Orders   ED Discharge Orders     None        Note:  This document was prepared using Dragon voice recognition software and may include unintentional dictation errors.   Dicky Anes, MD 01/15/24 2015

## 2024-01-15 NOTE — Consult Note (Signed)
 CODE SEPSIS - PHARMACY COMMUNICATION  **Broad Spectrum Antibiotics should be administered within 1 hour of Sepsis diagnosis**  Time Code Sepsis Called/Page Received: 1334  Antibiotics Ordered: Aztreonam  and Vancomycin   Time of 1st antibiotic administration: 1357  Additional action taken by pharmacy: none  If necessary, Name of Provider/Nurse Contacted: n/a   Sherry [name] PharmD, BCPS 01/15/2024 1:38 PM

## 2024-01-15 NOTE — H&P (Addendum)
 NAME:  Danashia Landers, MRN:  [id number], DOB:  16-Aug-1955, LOS: 0 ADMISSION DATE:  01/15/2024  History of Present Illness:  [name] is a 68 year old female patient with a past medical history of end-stage renal disease on HD MWF via right tunneled cath, seizure disorder, chronic hepatitis C, opioid use disorder, stroke, status post gastrostomy tube, hypertension, diastolic congestive heart failure, depression with anxiety, bipolar, breast cancer status post right mastectomy presenting to [facility] on 07/05 for confusion and altered mental status and hypotension.  She received dialysis earlier today and was reportedly described as being somnolent throughout the dialysis session.  She went back to her rehab and was found to be confused and unresponsive by the team and therefore EMS were called.  On arrival EMS she was found to have systolics in the 80s and therefore she was brought in to [facility] ED.  Of note, she was recently admitted to [facility] on 06/04 to 06/13 with E. coli bacteremia and signs of cholangitis.  She underwent ERCP on 06/06 and was noted to have choledocholithiasis, sphincterotomy was done and a plastic stent was placed.  In the ED, she was found to be hypotensive with MAP low 60s, she received 1 L normal saline bolus with good response to fluids.  She was started on broad-spectrum antibiotics with vancomycin  and aztreonam  and Flagyl .  Labs: Lactate 6.1 WBC 21.6 with 94% neutrophils, hemoglobin 9.1 and hematocrit 29.7, platelets 434. Electrolyte with CO2 21, BUN 33 and creatinine 3.1.  Potassium 3.7.  Chest x-ray without any acute process.  Pertinent  Medical History  As above  Significant Hospital Events: Including procedures, antibiotic start and stop dates in addition to other pertinent events   01/15/2024 admitted to the ICU  Objective    Blood pressure (!) 81/71, pulse 100, resp. rate 16, height 5' 2 (1.575 m), weight 54.3 kg, SpO2 100%.       No intake or output data in the  24 hours ending 01/15/24 1433 Filed Weights   01/15/24 1203  Weight: 54.3 kg    Examination: General: Altered, responds only minimally to voice commands, moaning from what it seems like to be pain HENT: Has her neck position to the right, reactive pupils, lipsmacking from tardive dyskinesia Lungs: Clear bilateral air entry Cardiovascular: Tachycardic normal S1, normal S2, regular rate and rhythm Abdomen: Distended and diffusely tender Extremities: Cold no edema  Labs and imaging were reviewed  Assessment and Plan  [name] is a 68 year old female patient with a past medical history of end-stage renal disease on HD MWF via right tunneled cath, seizure disorder, chronic hepatitis C, opioid use disorder, stroke, status post gastrostomy tube, hypertension, diastolic congestive heart failure, depression with anxiety, bipolar, breast cancer status post right mastectomy presenting to [facility] on 07/05 for confusion and altered mental status and hypotension.  #Sepsis with high suspicion for intrabdominal process given recent instrumentation with ERCP sphincterotomy and stent placement (12/17/2023), liver abscess vs Pancreatitis (Alk phosph elevated, CT A/P pending)  #Sacral wound with CT a/p signs of sacral osteomyelitis.  #Liver hypodense lesion progressing in size.  # ESRD on HD MWF via right tunneled cath #Lactic acidosis iso above  # Anxiety, depression and seizure disorder #Chronic hep C #HFpEF with Grade II Diastolic dysfunction in 2021  Neuro: Will need to resume her psych meds in the am. Pain management with Dilaudid  0.5mg  IV q4h PRN.  CVS: Maintain MAP  > 65. Received 1L IV fluid with good response  Lungs: O2 Sat goal >  90%.  GI: NPO for now. Aggressive lax per rectum. Consult GI for question liver abscess. Will possibly need IR drain placement?  ID: C/w Vanc aztreonam  and flagyl . Consult surgery for question sacral osteo.  Endo: POC 140-180.    Best Practice (right click and  Reselect all SmartList Selections daily)   Diet/type: NPO DVT prophylaxis prophylactic heparin   GI prophylaxis: N/A Lines: N/A Foley:  N/A Code Status:  full code  Critical care time: 60 minutes    Darrin Barn, MD [facility] 01/15/2024 4:01 PM

## 2024-01-16 DIAGNOSIS — Z515 Encounter for palliative care: Secondary | ICD-10-CM | POA: Diagnosis not present

## 2024-01-16 DIAGNOSIS — L8990 Pressure ulcer of unspecified site, unspecified stage: Secondary | ICD-10-CM | POA: Diagnosis present

## 2024-01-16 DIAGNOSIS — R4182 Altered mental status, unspecified: Secondary | ICD-10-CM | POA: Diagnosis not present

## 2024-01-16 DIAGNOSIS — Z7189 Other specified counseling: Secondary | ICD-10-CM | POA: Diagnosis not present

## 2024-01-16 DIAGNOSIS — M869 Osteomyelitis, unspecified: Secondary | ICD-10-CM | POA: Diagnosis not present

## 2024-01-16 DIAGNOSIS — A419 Sepsis, unspecified organism: Secondary | ICD-10-CM | POA: Diagnosis not present

## 2024-01-16 DIAGNOSIS — R6520 Severe sepsis without septic shock: Secondary | ICD-10-CM

## 2024-01-16 DIAGNOSIS — N179 Acute kidney failure, unspecified: Secondary | ICD-10-CM

## 2024-01-16 DIAGNOSIS — Z789 Other specified health status: Secondary | ICD-10-CM

## 2024-01-16 LAB — BLOOD CULTURE ID PANEL (REFLEXED) - BCID2

## 2024-01-16 LAB — GASTROINTESTINAL PANEL BY PCR, STOOL (REPLACES STOOL CULTURE)

## 2024-01-16 LAB — CBC
HCT: 29.2 % — ABNORMAL LOW (ref 36.0–46.0)
Hemoglobin: 9.2 g/dL — ABNORMAL LOW (ref 12.0–15.0)
MCH: 28.3 pg (ref 26.0–34.0)
MCHC: 31.5 g/dL (ref 30.0–36.0)
MCV: 89.8 fL (ref 80.0–100.0)
Platelets: 398 K/uL (ref 150–400)
RBC: 3.25 MIL/uL — ABNORMAL LOW (ref 3.87–5.11)
RDW: 16.1 % — ABNORMAL HIGH (ref 11.5–15.5)
WBC: 16 K/uL — ABNORMAL HIGH (ref 4.0–10.5)
nRBC: 0 % (ref 0.0–0.2)

## 2024-01-16 LAB — RENAL FUNCTION PANEL
Albumin: 2.1 g/dL — ABNORMAL LOW (ref 3.5–5.0)
Anion gap: 18 — ABNORMAL HIGH (ref 5–15)
BUN: 45 mg/dL — ABNORMAL HIGH (ref 8–23)
CO2: 20 mmol/L — ABNORMAL LOW (ref 22–32)
Calcium: 8.2 mg/dL — ABNORMAL LOW (ref 8.9–10.3)
Chloride: 97 mmol/L — ABNORMAL LOW (ref 98–111)
Creatinine, Ser: 3.14 mg/dL — ABNORMAL HIGH (ref 0.44–1.00)
GFR, Estimated: 16 mL/min — ABNORMAL LOW (ref >60)
Glucose, Bld: 104 mg/dL — ABNORMAL HIGH (ref 70–99)
Phosphorus: 4.7 mg/dL — ABNORMAL HIGH (ref 2.5–4.6)
Potassium: 3.7 mmol/L (ref 3.5–5.1)
Sodium: 135 mmol/L (ref 135–145)

## 2024-01-16 LAB — CLOSTRIDIUM DIFFICILE BY PCR, REFLEXED: Toxigenic C. Difficile by PCR: POSITIVE — AB

## 2024-01-16 LAB — MAGNESIUM: Magnesium: 2.1 mg/dL (ref 1.7–2.4)

## 2024-01-16 LAB — C DIFFICILE QUICK SCREEN W PCR REFLEX
C Diff antigen: POSITIVE — AB
C Diff toxin: NEGATIVE

## 2024-01-16 MED ORDER — POLYETHYLENE GLYCOL 3350 17 G PO PACK: 17.0000 g | PACK | Freq: Every day | ORAL | Status: DC | PRN

## 2024-01-16 MED ORDER — HYDROXYZINE HCL 25 MG PO TABS: 25.0000 mg | ORAL_TABLET | Freq: Three times a day (TID) | ORAL | Status: DC | PRN

## 2024-01-16 MED ORDER — SACCHAROMYCES BOULARDII 250 MG PO CAPS
250.0000 mg | ORAL_CAPSULE | Freq: Two times a day (BID) | ORAL | Status: DC
  Administered 2024-01-16 – 2024-01-20 (×10): 250 mg
  Filled 2024-01-16 (×12): qty 1

## 2024-01-16 MED ORDER — HEPARIN SODIUM (PORCINE) 5000 UNIT/ML IJ SOLN
5000.0000 [IU] | Freq: Three times a day (TID) | INTRAMUSCULAR | Status: DC
  Administered 2024-01-16 – 2024-01-18 (×6): 5000 [IU] via SUBCUTANEOUS
  Filled 2024-01-16 (×6): qty 1

## 2024-01-16 MED ORDER — HYDROMORPHONE HCL 2 MG PO TABS: 2.0000 mg | ORAL_TABLET | Freq: Four times a day (QID) | ORAL | Status: DC | PRN

## 2024-01-16 MED ORDER — SODIUM CHLORIDE 0.9 % IV SOLN
2.0000 g | INTRAVENOUS | Status: DC
  Administered 2024-01-16 – 2024-01-20 (×5): 2 g via INTRAVENOUS
  Filled 2024-01-16 (×6): qty 20

## 2024-01-16 MED ORDER — ONDANSETRON HCL 4 MG/2ML IJ SOLN: 4.0000 mg | Freq: Four times a day (QID) | INTRAMUSCULAR | Status: DC | PRN

## 2024-01-16 MED ORDER — VITAMIN C 500 MG PO TABS
500.0000 mg | ORAL_TABLET | Freq: Two times a day (BID) | ORAL | Status: DC
  Administered 2024-01-16 – 2024-01-20 (×10): 500 mg
  Filled 2024-01-16 (×10): qty 1

## 2024-01-16 MED ORDER — VANCOMYCIN 50 MG/ML ORAL SOLUTION
125.0000 mg | Freq: Four times a day (QID) | ORAL | Status: DC
Start: 2024-01-16 — End: 2024-01-26
  Administered 2024-01-16 – 2024-01-20 (×15): 125 mg
  Filled 2024-01-16 (×19): qty 2.5

## 2024-01-16 MED ORDER — CLOBAZAM 2.5 MG/ML PO SUSP
5.0000 mg | Freq: Two times a day (BID) | ORAL | Status: DC
  Administered 2024-01-16 – 2024-01-20 (×10): 5 mg
  Filled 2024-01-16 (×10): qty 4

## 2024-01-16 MED ORDER — HYDROMORPHONE HCL 1 MG/ML IJ SOLN
1.0000 mg | INTRAMUSCULAR | Status: DC | PRN
  Administered 2024-01-16: 1 mg via INTRAVENOUS
  Filled 2024-01-16: qty 1

## 2024-01-16 MED ORDER — COLLAGENASE 250 UNIT/GM EX OINT
TOPICAL_OINTMENT | Freq: Every day | CUTANEOUS | Status: DC
  Filled 2024-01-16 (×2): qty 30

## 2024-01-16 MED ORDER — LORAZEPAM 2 MG/ML IJ SOLN: 2.0000 mg | INTRAMUSCULAR | Status: DC | PRN

## 2024-01-16 MED ORDER — ACETAMINOPHEN 325 MG PO TABS: 650.0000 mg | ORAL_TABLET | Freq: Four times a day (QID) | ORAL | Status: DC | PRN

## 2024-01-16 MED ORDER — LEVETIRACETAM 100 MG/ML PO SOLN
500.0000 mg | Freq: Two times a day (BID) | ORAL | Status: DC
  Administered 2024-01-16 – 2024-01-20 (×10): 500 mg
  Filled 2024-01-16 (×12): qty 5

## 2024-01-16 MED ORDER — LACOSAMIDE 50 MG PO TABS
200.0000 mg | ORAL_TABLET | Freq: Two times a day (BID) | ORAL | Status: DC
  Administered 2024-01-16 (×2): 200 mg
  Filled 2024-01-16 (×3): qty 4

## 2024-01-16 MED ORDER — CHLORHEXIDINE GLUCONATE CLOTH 2 % EX PADS
6.0000 | MEDICATED_PAD | Freq: Every day | CUTANEOUS | Status: DC
  Administered 2024-01-16 – 2024-01-20 (×4): 6 via TOPICAL

## 2024-01-16 MED ORDER — SERTRALINE HCL 50 MG PO TABS
25.0000 mg | ORAL_TABLET | Freq: Every day | ORAL | Status: DC
  Administered 2024-01-16 – 2024-01-20 (×4): 25 mg
  Filled 2024-01-16 (×4): qty 1

## 2024-01-16 MED ORDER — PANTOPRAZOLE SODIUM 40 MG PO TBEC
40.0000 mg | DELAYED_RELEASE_TABLET | Freq: Every day | ORAL | Status: DC
  Administered 2024-01-17 – 2024-01-18 (×2): 40 mg via ORAL
  Filled 2024-01-16 (×3): qty 1

## 2024-01-16 MED ORDER — HALOPERIDOL 1 MG PO TABS: 1.0000 mg | ORAL_TABLET | ORAL | Status: DC

## 2024-01-16 MED ORDER — SODIUM BICARBONATE 8.4 % IV SOLN
50.0000 meq | Freq: Once | INTRAVENOUS | Status: AC
  Administered 2024-01-16: 50 meq via INTRAVENOUS
  Filled 2024-01-16: qty 50

## 2024-01-16 NOTE — Evaluation (Signed)
 Physical Therapy Evaluation Patient Details Name: Sherry [name] MRN: [id number] DOB: 1956/05/15 Today's Date: 01/16/2024  History of Present Illness  Patient is a 68 year old female found to be unresponsive and hypotensive upon return to facility from dialysis. PMH: ESRD on HD,  seizure disorder, chronic hepatitis C, opioid use disorder, stroke, status post gastrostomy tube, hypertension, diastolic congestive heart failure, depression with anxiety, bipolar, breast cancer status post right mastectomy  Clinical Impression  Patient coming from SNF where she was getting PT and OT. Patient was able to sit on the edge of bed with assistance with therapy prior to this hospital stay, but required a lift to get OOB at the facility.  Today the patient only responds to being repositioned in the bed. She opens her eyes briefly with being rolled to the left side, but does not respond to multi modal sensory stimulation otherwise. She does not follow commands today. Recommend to continue PT as patient able to participate in attempts to maximize independence and decrease caregiver burden.       If plan is discharge home, recommend the following: Two people to help with walking and/or transfers;Two people to help with bathing/dressing/bathroom;Assistance with cooking/housework;Supervision due to cognitive status;Help with stairs or ramp for entrance;Assistance with feeding;Direct supervision/assist for medications management;Direct supervision/assist for financial management;Assist for transportation   Can travel by private vehicle   No    Equipment Recommendations None recommended by PT  Recommendations for Other Services       Functional Status Assessment Patient has had a recent decline in their functional status and demonstrates the ability to make significant improvements in function in a reasonable and predictable amount of time.     Precautions / Restrictions Precautions Precautions: Fall Recall of  Precautions/Restrictions: Impaired Precaution/Restrictions Comments: sacral decubitus Restrictions Weight Bearing Restrictions Per Provider Order: No      Mobility  Bed Mobility Overal bed mobility: Needs Assistance Bed Mobility: Rolling Rolling: Total assist, +2 for physical assistance         General bed mobility comments: no active participation with bed mobility efforts    Transfers                        Ambulation/Gait                  Stairs            Wheelchair Mobility     Tilt Bed    Modified Rankin (Stroke Patients Only)       Balance                                             Pertinent Vitals/Pain Pain Assessment Pain Assessment: CPOT Facial Expression: Relaxed, neutral Body Movements: Protection Muscle Tension: Tense, rigid Compliance with ventilator (intubated pts.): N/A Vocalization (extubated pts.): Sighing, moaning CPOT Total: 3    Home Living Family/patient expects to be discharged to:: Skilled nursing facility                        Prior Function Prior Level of Function : Needs assist             Mobility Comments: at least one person assistance for bed mobility, transfers with a lift ADLs Comments: assistance required, able to feed herself briefly but fatigues quickly  Extremity/Trunk Assessment   Upper Extremity Assessment Upper Extremity Assessment: Defer to OT evaluation    Lower Extremity Assessment Lower Extremity Assessment: Generalized weakness (difficult to assess due to inability to follow commands. minimal to no active movement)       Communication   Communication Communication: Impaired Factors Affecting Communication: Difficulty expressing self    Cognition Arousal: Obtunded Behavior During Therapy: Flat affect   PT - Cognitive impairments: History of cognitive impairments                       PT - Cognition Comments: patient is  unable to follow any commands during this visit Following commands: Impaired Following commands impaired:  (unable to follow commands)     Cueing Cueing Techniques: Verbal cues, Tactile cues     General Comments General comments (skin integrity, edema, etc.): patient opens eyes briefly and moans with repositioning. no increased alertness with simultation otherwise including light touch, voice, elevating head of bed    Exercises     Assessment/Plan    PT Assessment Patient needs continued PT services  PT Problem List Decreased strength;Decreased range of motion;Decreased activity tolerance;Decreased balance;Decreased mobility;Decreased cognition       PT Treatment Interventions DME instruction;Functional mobility training;Therapeutic activities;Therapeutic exercise;Balance training;Neuromuscular re-education;Patient/family education;Cognitive remediation;Wheelchair mobility training    PT Goals (Current goals can be found in the Care Plan section)  Acute Rehab PT Goals Patient Stated Goal: to keep getting stronger PT Goal Formulation: With family Time For Goal Achievement: 01/30/24 Potential to Achieve Goals: Poor    Frequency Min 1X/week     Co-evaluation               AM-PAC PT 6 Clicks Mobility  Outcome Measure Help needed turning from your back to your side while in a flat bed without using bedrails?: Total Help needed moving from lying on your back to sitting on the side of a flat bed without using bedrails?: Total Help needed moving to and from a bed to a chair (including a wheelchair)?: Total Help needed standing up from a chair using your arms (e.g., wheelchair or bedside chair)?: Total Help needed to walk in hospital room?: Total Help needed climbing 3-5 steps with a railing? : Total 6 Click Score: 6    End of Session   Activity Tolerance: Patient limited by lethargy Patient left: in bed;with call bell/phone within reach;with family/visitor present    PT Visit Diagnosis: Muscle weakness (generalized) (M62.81);Unsteadiness on feet (R26.81)    Time: 8851-8790 PT Time Calculation (min) (ACUTE ONLY): 21 min   Charges:   PT Evaluation $PT Eval Moderate Complexity: 1 Mod   PT General Charges $$ ACUTE PT VISIT: 1 Visit         Randine Essex, PT, MPT   Randine LULLA Essex 01/16/2024, 1:50 PM

## 2024-01-16 NOTE — Plan of Care (Signed)
 Disoriented patient, responds to name only. Patient incontinent of stool this shift, dressing changed per order. Oral care provided with repositioning. Bladder scan performed with 0 urine showing in bladder. NP notified, no new orders as patient is oliguric. Sister Mercer updated this shift. Flagyl  and Azactam  discontinued and Rocephin  2 GM q 24 hours started.  Problem: Clinical Measurements: Goal: Ability to maintain clinical measurements within normal limits will improve Outcome: Progressing Goal: Respiratory complications will improve Outcome: Progressing Goal: Cardiovascular complication will be avoided Outcome: Progressing   Problem: Coping: Goal: Level of anxiety will decrease Outcome: Progressing   Problem: Elimination: Goal: Will not experience complications related to bowel motility Outcome: Progressing Goal: Will not experience complications related to urinary retention Outcome: Progressing   Problem: Pain Managment: Goal: General experience of comfort will improve and/or be controlled Outcome: Progressing   Problem: Safety: Goal: Ability to remain free from injury will improve Outcome: Progressing   Problem: Education: Goal: Knowledge of General Education information will improve Description: Including pain rating scale, medication(s)/side effects and non-pharmacologic comfort measures Outcome: Not Progressing   Problem: Clinical Measurements: Goal: Will remain free from infection Outcome: Not Progressing Goal: Diagnostic test results will improve Outcome: Not Progressing   Problem: Activity: Goal: Risk for activity intolerance will decrease Outcome: Not Progressing   Problem: Nutrition: Goal: Adequate nutrition will be maintained Outcome: Not Progressing   Problem: Skin Integrity: Goal: Risk for impaired skin integrity will decrease Outcome: Not Progressing

## 2024-01-16 NOTE — Progress Notes (Signed)
 PHARMACY CONSULT NOTE - FOLLOW UP  Pharmacy Consult for Electrolyte Monitoring and Replacement   Recent Labs: Potassium (mmol/L)  Date Value  01/16/2024 3.7   Magnesium  (mg/dL)  Date Value  92/93/7974 2.1   Calcium  (mg/dL)  Date Value  92/93/7974 8.2 (L)   Albumin (g/dL)  Date Value  92/93/7974 2.1 (L)  07/25/2020 4.2   Phosphorus (mg/dL)  Date Value  92/93/7974 4.7 (H)   Sodium (mmol/L)  Date Value  01/16/2024 135  07/25/2020 141     Assessment:  68 y.o.  female with end stage renal disease on hemodialysis, seizure disorders, congestive heart failure, depression, bipolar disorder, and breast cancer who is admitted to [facility] on 01/15/2024 for Resp distress. Pharmacy is asked to follow and replace electrolytes while in CCU   Goal of Therapy:  Electrolytes WNL  Plan:  ---no electrolyte replacement warranted ---recheck electrolytes again in am  Sherry [name] ,PharmD Clinical Pharmacist 01/16/2024 7:02 AM

## 2024-01-16 NOTE — TOC Initial Note (Signed)
 Transition of Care [facility]) - Initial/Assessment Note    Patient Details  Name: Sherry [name] MRN: [id number] Date of Birth: 1956/05/29  Transition of Care [facility]) CM/SW Contact:    [place] L Parris Signer, LCSW Phone Number: 01/16/2024, 4:24 PM  Clinical Narrative:                  CSW spoke with pt sister, Sherry [name]. Ms. [name] stated that she will be making medical decisions for her sister. Readmission screening, brief assessment and MOON letter reviewed.   Ms. [name] advised that she wanted the facility changed. CSW advised that this is not likely as OT/PT have not been able to complete an assessment. Patient is not alert and oriented.   TOC will continue to follow patient to determine needs.         Patient Goals and CMS Choice            Expected Discharge Plan and Services                                              Prior Living Arrangements/Services                       Activities of Daily Living      Permission Sought/Granted                  Emotional Assessment              Admission diagnosis:  Sepsis [facility]) [A41.9] Patient Active Problem List   Diagnosis Date Noted   Osteomyelitis (HCC) 01/16/2024   Pressure injury of skin 01/16/2024   Sepsis (HCC) 01/15/2024   Malnutrition of moderate degree 12/20/2023   Calculus of bile duct without cholecystitis with obstruction 12/17/2023   E coli bacteremia 12/16/2023   Cholangitis 12/16/2023   Severe sepsis (HCC) 12/15/2023   UTI (urinary tract infection) 12/15/2023   Liver lesion 12/15/2023   Acute metabolic encephalopathy 12/15/2023   Sacral wound 12/15/2023   Abnormal liver function 12/15/2023   Seizure (HCC) 12/15/2023   Benzodiazepine dependence (HCC) 05/22/2023   Polypharmacy 05/22/2023   History of substance abuse (HCC) 05/22/2023   ESRD on dialysis (HCC) 01/29/2022   Dialysis AV fistula malfunction, initial encounter (HCC) 01/29/2022   Status post lumbar surgery 06/06/2021    Tardive dyskinesia 04/04/2021   Hyperkalemia 04/03/2021   Impaired functional mobility, balance, gait, and endurance 01/09/2021   Spondylolisthesis, lumbar region 10/21/2020   Anxiety    Bipolar and related disorder (HCC)    Blood transfusion without reported diagnosis    Breast cancer (HCC)    CHF (congestive heart failure) (HCC)    Chronic back pain    Chronic kidney disease    Depression    Dyspnea    Hepatitis C    MRSA infection    Opioid dependence (HCC)    Substance abuse (HCC)    Body mass index (BMI) 40.0-44.9, adult (HCC) 07/19/2020   Chest pain 05/31/2020   Near syncope 05/29/2020   Chest pain, rule out acute myocardial infarction 05/29/2020   Cervical spondylosis 05/24/2020   Clonus 05/24/2020   Neck pain 05/24/2020   Chronic bilateral low back pain with right-sided sciatica 05/24/2020   Arthropathy of lumbar facet joint 05/24/2020   CKD (chronic kidney disease) stage 4, GFR 15-29 ml/min (HCC) 04/18/2020   ARF (acute  renal failure) (HCC) 04/09/2020   Nausea 04/09/2020   Chronic diastolic CHF (congestive heart failure) (HCC) 05/09/2019   GERD (gastroesophageal reflux disease) 05/09/2019   Alcohol  abuse 05/09/2019   Hypertension 05/08/2019   Bilateral low back pain with bilateral sciatica 01/05/2019   Stroke (HCC) 2020   Cyst of ovary    Pre-operative cardiovascular examination 06/13/2018   Dizziness 02/24/2018   Left-sided weakness 02/24/2018   Cigarette smoker 12/31/2017   DOE (dyspnea on exertion) 12/30/2017   Phlebitis after infusion 10/28/2017   Hypertensive emergency 10/21/2017   Microcytic anemia 10/21/2017   Medication intolerance 09/06/2017   Neuroleptic-induced tardive dyskinesia 09/06/2017   Cancer (HCC) 09/06/2017   Chronic low back pain 09/06/2017   Grief reaction with prolonged bereavement 09/06/2017   Opioid use disorder, severe, dependence (HCC) 08/27/2017   Alcohol  use disorder, severe, dependence (HCC) 08/27/2017   Substance induced  mood disorder (HCC) 08/13/2017   MDD (major depressive disorder), recurrent severe, without psychosis (HCC)    Alcohol  withdrawal (HCC) 08/09/2017   Essential hypertension 07/06/2017   Chronic kidney disease, stage III (moderate) (HCC) 11/17/2016   Depression with anxiety 08/21/2016   Hx of bipolar disorder 01/31/2016   Oral aphthous ulcer 06/25/2015   History of breast cancer 02/13/2013   Morbid obesity due to excess calories (HCC) 02/13/2013   Restless legs syndrome 02/13/2013   Subacute dyskinesia due to drug 02/13/2013   Dyspnea and respiratory abnormality 02/13/2013   Hepatitis C virus infection cured after antiviral drug therapy 12/17/2012   PCP:  Caleen Dirks, MD Pharmacy:   [id number] - [place], [place] - [address] AT [address] [place] [address] Phone: [phone] Fax: [phone]  [id number] - [place], [place] - [address] AT [facility] OF [address] [place]  [address] Phone: [phone] Fax: [phone]  [facility] [place] [facility] [address], [address] [place] [address] Phone: [phone] Fax: [phone]     Social Drivers of Health (SDOH) Social History: SDOH Screenings   Food Insecurity: Patient Unable To Answer (01/15/2024)  Housing: Patient Unable To Answer (01/15/2024)  Transportation Needs: Patient Unable To Answer (01/15/2024)  Utilities: Patient Unable To Answer (01/15/2024)  Alcohol  Screen: Medium Risk (08/27/2017)  Depression (PHQ2-9): Medium Risk (12/20/2020)  Social Connections: Patient Unable To Answer (01/15/2024)  Tobacco Use: High Risk (01/15/2024)   SDOH Interventions:     Readmission Risk Interventions    01/16/2024    4:23 PM 01/16/2024    9:55 AM  Readmission Risk Prevention Plan  Transportation Screening Complete   Medication Review (RN Care Manager) Complete Complete  PCP or  Specialist appointment within 3-5 days of discharge Complete Complete  HRI or Home Care Consult Not Complete   SW Recovery Care/Counseling Consult Complete   Palliative Care Screening Not Applicable   Skilled Nursing Facility Complete

## 2024-01-16 NOTE — Consult Note (Signed)
 Consultation  Referring Provider:  ICU    Admit date: 01/15/2024 Consult date: 01/16/2024         Reason for Consultation: Abnormal liver enzymes/imaging              HPI:   Sherry [name] is a 68 y.o. lady with history of ESRD on hemodialysis, HFpEF, bipolar, opioid use disorder, hypertension, Hep c (with cure, no evidence of cirrhosis), and arthritis here with e. Coli bacteremia of unknown source. Patient unable to provide history but history gathered from sister. She has been in and out of the hospital since January and has been basically bed bound since January. Initially this was due to hypertensive episodes and then PRES but she has now developed a stage IV decub and likely osteomyelitis. She recently was admitted here in June with e. Coli bacteremia and was treated as cholangitis where she underwent ERCP with biliary sphincterotomy which noted inflammatory strictures. She had sludge removed and a stent placed. She was discharged on two week course of antibiotics. She returns with sepsis from e. Coli. Non-contrast CT shows larger size of lesions in the right lobe of the liver concerning for dilated bilary radicles vs. Abscess, less likely malignancy. She underwent MRCP in March at [facility] that did not mention these lesions but did note dilated CBD. On the most recent hospitalization here is when the lesions were first noted. Per family, at the rehab location it was not unusual for her sacral decub to get contaminated with stool.   Past Medical History:  Diagnosis Date   Alcohol  use disorder, severe, dependence (HCC) 08/27/2017   Anxiety    Body mass index (BMI) 40.0-44.9, adult (HCC) 07/19/2020   Breast cancer (HCC)    right lumpectemy and lymph node    Cervical spondylosis 05/24/2020   Chronic bilateral low back pain with right-sided sciatica 05/24/2020   Chronic diastolic CHF (congestive heart failure) (HCC) 05/09/2019   Cigarette smoker 12/31/2017   ESRD (end stage renal  disease) on dialysis (HCC) 08/22/2021   M-W-F   Essential hypertension 07/06/2017   GERD (gastroesophageal reflux disease)    Hepatitis C virus infection cured after antiviral drug therapy 12/17/2012   Telephone Encounter - Mercie Planas, RN - 12/28/2016 9:54 AM EDT Patient called for HCV RNA test results. EOT 08-26-16 - not detected. 12 week post treatment 12-11-16 - not detected. Informed patient that she was cured. Patient verbalized understanding.          Hx of bipolar disorder 01/31/2016   Impaired functional mobility, balance, gait, and endurance 01/09/2021   MRSA infection    of breast incision   Neuroleptic-induced tardive dyskinesia 09/06/2017   Abilify   Opioid use disorder, severe, dependence (HCC) 08/27/2017   Restless legs syndrome 02/13/2013   Formatting of this note might be different from the original. IMPRESSION: Possible. Will follow.   Subacute dyskinesia due to drug 02/13/2013   Formatting of this note might be different from the original. STORY: Tardive dyskinesia and mild limb dyskinesia, LE>UE which likely due to prolonged antipsychotic used, abilify. Couldn't tolerate artane, gabapentin , clonazepam and xenazine.`E1o3L`IMPRESSION: Well tolerated depakote  250mg  bid, mood aspect is better as well. Will increase to 500mg  bid. ADR was discussed.  RTC 4-6 weeks.   TIA (transient ischemic attack) 2020   P/w BP >230/120 and neurologic symptoms, presumed TIA    Past Surgical History:  Procedure Laterality Date   A/V FISTULAGRAM Right 01/29/2022   Procedure: A/V Fistulagram;  Surgeon: Gretta Lonni PARAS,  MD;  Location: [facility];  Service: Cardiovascular;  Laterality: Right;   AV FISTULA PLACEMENT Right 05/01/2021   Procedure: RIGHT ARTERIOVENOUS (AV) FISTULA CREATION;  Surgeon: Lanis Fonda BRAVO, MD;  Location: [facility] OR;  Service: Vascular;  Laterality: Right;  PERIPHERAL NERVE BLOCK   BACK SURGERY  1990   BASCILIC VEIN TRANSPOSITION Right 09/02/2021   Procedure: RIGHT  SECOND STAGE BASILIC VEIN TRANSPOSITION;  Surgeon: Lanis Fonda BRAVO, MD;  Location: [facility] OR;  Service: Vascular;  Laterality: Right;  PERIPHERAL NERVE BLOCK   BILIARY STENT PLACEMENT  12/17/2023   Procedure: INSERTION, STENT, BILE DUCT;  Surgeon: Jinny Carmine, MD;  Location: [facility] ENDOSCOPY;  Service: Endoscopy;;   BREAST SURGERY Right 2010   breast cancer survivor - states partial mastectomy and nodes   COLONOSCOPY     [facility]   ERCP N/A 12/17/2023   Procedure: ERCP, WITH INTERVENTION IF INDICATED;  Surgeon: Jinny Carmine, MD;  Location: [facility] ENDOSCOPY;  Service: Endoscopy;  Laterality: N/A;   ESOPHAGOGASTRODUODENOSCOPY     [facility]   ESOPHAGOGASTRODUODENOSCOPY  04/18/2020   [facility]   LAPAROSCOPIC CHOLECYSTECTOMY  2002   LAPAROSCOPIC INCISIONAL / UMBILICAL / VENTRAL HERNIA REPAIR  04/13/2018   with BARD 15x 20cm mesh (supraumbilical)   LAPAROSCOPIC LYSIS OF ADHESIONS  07/12/2018   Procedure: LAPAROSCOPIC LYSIS OF ADHESIONS;  Surgeon: Anitra Freddy NOVAK, MD;  Location: [facility] ORS;  Service: Gynecology;;   MULTIPLE TOOTH EXTRACTIONS     MYOMECTOMY     x 2 prior to hysterectomy   PERIPHERAL VASCULAR BALLOON ANGIOPLASTY Right 01/29/2022   Procedure: PERIPHERAL VASCULAR BALLOON ANGIOPLASTY;  Surgeon: Gretta Lonni PARAS, MD;  Location: [facility];  Service: Cardiovascular;  Laterality: Right;  arm fistula   ROBOTIC ASSISTED BILATERAL SALPINGO OOPHERECTOMY Right 07/12/2018   Procedure: XI ROBOTIC ASSISTED RIGHT SALPINGO OOPHORECTOMY;  Surgeon: Anitra Freddy NOVAK, MD;  Location: [facility] ORS;  Service: Gynecology;  Laterality: Right;   STONE EXTRACTION WITH BASKET  12/17/2023   Procedure: ERCP, WITH LITHROTRIPSY OR REMOVAL OF COMMON BILE DUCT CALCULUS USING BASKET;  Surgeon: Jinny Carmine, MD;  Location: [facility] ENDOSCOPY;  Service: Endoscopy;;   TOTAL ABDOMINAL HYSTERECTOMY     fibroids    UPPER GASTROINTESTINAL ENDOSCOPY     WISDOM TOOTH EXTRACTION      Family History  Problem  Relation Age of Onset   Heart attack Mother    Breast cancer Mother 25   Dementia Mother    Cancer Father 55       died of bleeding from kidneys   Colon cancer Neg Hx    Esophageal cancer Neg Hx    Stomach cancer Neg Hx    Rectal cancer Neg Hx     Social History   Tobacco Use   Smoking status: Every Day    Current packs/day: 0.50    Types: Cigarettes    Passive exposure: Never   Smokeless tobacco: Never  Vaping Use   Vaping status: Former   Start date: 06/27/2013   Quit date: 06/14/2017   Devices: Apple Cinnamon-0 mg  Substance Use Topics   Alcohol  use: Not Currently    Comment: hx over 30 years   Drug use: Not Currently    Comment: h/o IVD use    Prior to Admission medications   Medication Sig Start Date End Date Taking? Authorizing Provider  acetaminophen  (TYLENOL ) 325 MG tablet Take 325 mg by mouth every 6 (six) hours as needed. 12/30/21  Yes [provider]  albuterol  (VENTOLIN  HFA) 108 (90 Base) MCG/ACT inhaler Inhale 2 puffs into the lungs every 4 (four) hours as needed. 05/24/23  Yes [provider]  ascorbic acid  (VITAMIN C ) 500 MG tablet Place 1 tablet (500 mg total) into feeding tube 2 (two) times daily. 12/24/23  Yes Fausto Sor A, DO  aspirin  81 MG chewable tablet Place 81 mg into feeding tube daily.   Yes [provider]  cetirizine HCl (ZYRTEC) 1 MG/ML solution Take 10 mg by mouth daily.   Yes [provider]  cloBAZam  (ONFI ) 2.5 MG/ML solution Place 2 mLs into feeding tube every 12 (twelve) hours. 12/11/23  Yes [provider]  diphenhydrAMINE  (BENADRYL ) 25 mg capsule Place 25 mg into feeding tube every 6 (six) hours as needed.   Yes [provider]  ferrous sulfate  325 (65 FE) MG tablet Take 325 mg by mouth daily.   Yes [provider]  gabapentin  (NEURONTIN ) 100 MG capsule 100 mg 3 (three) times daily. Via G-tube   Yes [provider]  haloperidol  (HALDOL ) 1 MG tablet Take 1 mg by mouth  2 (two) times a week. Tuesday and Saturday for agitation before dialysis   Yes [provider]  hydrOXYzine  (ATARAX ) 25 MG tablet Take 25 mg by mouth 3 (three) times daily as needed.   Yes [provider]  lacosamide  (VIMPAT ) 200 MG TABS tablet Place 200 mg into feeding tube 2 (two) times daily.   Yes [provider]  levETIRAcetam  (KEPPRA ) 100 MG/ML solution Place 5 mLs into feeding tube 2 (two) times daily.   Yes [provider]  multivitamin (RENA-VIT) TABS tablet Place 1 tablet into feeding tube at bedtime. 12/24/23  Yes Fausto Sor A, DO  omeprazole  (PRILOSEC OTC) 20 MG tablet Take 20 mg by mouth daily. Per G-tube   Yes [provider]  ondansetron  (ZOFRAN ) 4 MG/5ML solution Place 5 mLs into feeding tube daily.   Yes [provider]  oxyCODONE  (OXY IR/ROXICODONE ) 5 MG immediate release tablet Take 5 mg by mouth every 4 (four) hours as needed for severe pain (pain score 7-10).   Yes [provider]  pantoprazole  (PROTONIX ) 40 MG tablet Take 1 tablet (40 mg total) by mouth daily. 02/01/22 01/15/24 Yes Drusilla Sabas RAMAN, MD  polyethylene glycol (MIRALAX  / GLYCOLAX ) 17 g packet Take 17 g by mouth daily.   Yes [provider]  senna-docusate (SENOKOT-S) 8.6-50 MG tablet Take 1 tablet by mouth at bedtime as needed for mild constipation. 03/02/23  Yes Long, Fonda MATSU, MD  sertraline  (ZOLOFT ) 25 MG tablet Take 25 mg by mouth daily.   Yes [provider]  sevelamer  carbonate (RENVELA ) 800 MG tablet Place 800 mg into feeding tube 3 (three) times daily with meals.   Yes [provider]  zinc  sulfate, 50mg  elemental zinc , 220 (50 Zn) MG capsule Place 1 capsule (220 mg total) into feeding tube daily. 12/24/23  Yes Fausto Sor A, DO  carvedilol  (COREG ) 12.5 MG tablet Take 25 mg by mouth 2 (two) times daily.    [provider]  cefTAZidime in dextrose  5 % 50 mL Give 1g IV on Tuesdays, 1g IV on Thursdays, 2g IV on  Saturdays at dialysis.  Stop date 12/29/2023. 12/24/23   Fausto Sor LABOR, DO  hydrALAZINE  (APRESOLINE ) 50 MG tablet Take 50 mg by mouth 3 (three) times daily. 03/24/21   [provider]  hydrocortisone  cream 1 % Apply 1 Application topically 2 (two) times daily. Patient not taking:  Reported on 01/15/2024    [provider]  mupirocin  ointment (BACTROBAN ) 2 % Apply 1 Application topically 2 (two) times daily. Patient not taking: Reported on 01/15/2024    [provider]  Nutritional Supplements (FEEDING SUPPLEMENT, NEPRO CARB STEADY,) LIQD Take 237 mLs by mouth 3 (three) times daily between meals. 12/24/23   Fausto Burnard LABOR, DO    Current Facility-Administered Medications  Medication Dose Route Frequency Provider Last Rate Last Admin   acetaminophen  (TYLENOL ) tablet 650 mg  650 mg Per Tube Q6H PRN Von Bellis, MD       ascorbic acid  (VITAMIN C ) tablet 500 mg  500 mg Per Tube BID Von Bellis, MD   500 mg at 01/16/24 9071   cefTRIAXone  (ROCEPHIN ) 2 g in sodium chloride  0.9 % 100 mL IVPB  2 g Intravenous Q24H Kathrene Almarie Bake, NP   Stopped at 01/16/24 9686   Chlorhexidine  Gluconate Cloth 2 % PADS 6 each  6 each Topical Daily Assaker, Darrin, MD   6 each at 01/16/24 0930   cloBAZam  (ONFI ) 2.5 MG/ML oral suspension 5 mg  5 mg Per Tube Q12H Von Bellis, MD   5 mg at 01/16/24 9071   docusate sodium  (COLACE) capsule 100 mg  100 mg Oral BID PRN Assaker, Darrin, MD       fentaNYL  (SUBLIMAZE ) injection 25 mcg  25 mcg Intravenous Once Quale, Mark, MD       NOREEN ON 01/18/2024] haloperidol  (HALDOL ) tablet 1 mg  1 mg Per Tube Once per day on Tuesday Saturday Niels Kayla FALCON, [facility]       heparin  injection 5,000 Units  5,000 Units Subcutaneous Q8H Von Bellis, MD       HYDROmorphone  (DILAUDID ) injection 1 mg  1 mg Intravenous Q4H PRN Von Bellis, MD   1 mg at 01/16/24 9068   HYDROmorphone  (DILAUDID ) tablet 2 mg  2 mg Per Tube Q6H PRN Von Bellis, MD        hydrOXYzine  (ATARAX ) tablet 25 mg  25 mg Per Tube TID PRN Von Bellis, MD       lacosamide  (VIMPAT ) tablet 200 mg  200 mg Per Tube BID Von Bellis, MD   200 mg at 01/16/24 9072   levETIRAcetam  (KEPPRA ) 100 MG/ML solution 500 mg  500 mg Per Tube BID Von Bellis, MD   500 mg at 01/16/24 9071   liver oil-zinc  oxide (DESITIN) 40 % ointment   Topical BID Malka Darrin, MD   1 Application at 01/16/24 0930   LORazepam  (ATIVAN ) injection 2 mg  2 mg Intravenous Q5 Min x 3 PRN Von Bellis, MD       ondansetron  (ZOFRAN ) injection 4 mg  4 mg Intravenous Q6H PRN Von Bellis, MD       Oral care mouth rinse  15 mL Mouth Rinse PRN Assaker, Darrin, MD       pantoprazole  (PROTONIX ) EC tablet 40 mg  40 mg Oral Daily Von Bellis, MD       polyethylene glycol (MIRALAX  / GLYCOLAX ) packet 17 g  17 g Per Tube Daily PRN Niels Kayla FALCON, RPH       sertraline  (ZOLOFT ) tablet 25 mg  25 mg Per Tube Daily Von Bellis, MD   25 mg at 01/16/24 9071   sodium hypochlorite (DAKIN'S 1/4 STRENGTH) topical solution   Irrigation Daily Assaker, Darrin, MD   1 Application at 01/16/24 0930   white petrolatum  (VASELINE) gel   Topical BID Malka Darrin, MD   1 Application at 01/16/24  9073    Allergies as of 01/15/2024 - Review Complete 01/15/2024  Allergen Reaction Noted   Abilify [aripiprazole] Other (See Comments) 09/06/2017   Remeron  [mirtazapine ] Other (See Comments) 09/06/2017   Trazodone  and nefazodone Other (See Comments) 09/06/2017   Augmentin [amoxicillin -pot clavulanate]  12/15/2023   Flexeril [cyclobenzaprine] Other (See Comments) 04/14/2015   Amoxicillin  Diarrhea 08/12/2017     Review of Systems:    All systems reviewed and negative except where noted in HPI.  Gen: Denies any fever, chills, sweats, anorexia, fatigue, weakness, malaise, weight loss, and sleep disorder CV: Denies chest pain, angina, palpitations, syncope, orthopnea, PND, peripheral edema, and claudication. Resp:  Denies dyspnea at rest, dyspnea with exercise, cough, sputum, wheezing, coughing up blood, and pleurisy. GI: Denies vomiting blood, jaundice, and fecal incontinence.   Denies dysphagia or odynophagia. GU : Denies urinary burning, blood in urine, urinary frequency, urinary hesitancy, nocturnal urination, and urinary incontinence. MS: Denies joint pain, limitation of movement, and swelling, stiffness, low back pain, extremity pain. Denies muscle weakness, cramps, atrophy.  Derm: Denies rash, itching, dry skin, hives, moles, warts, or unhealing ulcers.  Psych: Denies depression, anxiety, memory loss, suicidal ideation, hallucinations, paranoia, and confusion. Heme: Denies bruising, bleeding, and enlarged lymph nodes. Neuro:  Denies any headaches, dizziness, paresthesias. Endo:  Denies any problems with DM, thyroid, adrenal function.    Physical Exam:  Vital signs in last 24 hours: Temp:  [97.9 F (36.6 C)-98.8 F (37.1 C)] 98.6 F (37 C) (07/06 1102) Pulse Rate:  [80-100] 92 (07/06 1100) Resp:  [13-33] 13 (07/06 1100) BP: (81-149)/(55-86) 125/65 (07/06 1100) SpO2:  [97 %-100 %] 99 % (07/06 1100) Weight:  [53.6 kg-54.3 kg] 53.6 kg (07/05 1539) Last BM Date : 01/16/24 General:   Altered mental status Head:  Normocephalic and atraumatic. Eyes:   No icterus.   Conjunctiva pink. Mouth: Mucosa pink moist, no lesions. Neck:  Supple; no masses felt Lungs:  No respiratory distress Abdomen:   Flat, soft, nondistended, nontender Msk:  No clubbing or cyanosis Neurologic:  unable to assess  Skin:  Warm, dry, pink without significant lesions or rashes.  LAB RESULTS: Recent Labs    01/15/24 1300 01/16/24 0419  WBC 21.6* 16.0*  HGB 9.1* 9.2*  HCT 29.7* 29.2*  PLT 434* 398   BMET Recent Labs    01/15/24 1300 01/16/24 0419  NA 134* 135  K 3.7 3.7  CL 95* 97*  CO2 21* 20*  GLUCOSE 148* 104*  BUN 33* 45*  CREATININE 3.10* 3.14*  CALCIUM  8.3* 8.2*   LFT Recent Labs     01/15/24 1300 01/16/24 0419  PROT 7.4  --   ALBUMIN 2.3* 2.1*  AST 89*  --   ALT 26  --   ALKPHOS 862*  --   BILITOT 0.7  --    PT/INR Recent Labs    01/15/24 1300  LABPROT 14.7  INR 1.1    STUDIES: CT ABDOMEN PELVIS WO CONTRAST Result Date: 01/15/2024 CLINICAL DATA:  Abdomen pain unresponsive EXAM: CT ABDOMEN AND PELVIS WITHOUT CONTRAST TECHNIQUE: Multidetector CT imaging of the abdomen and pelvis was performed following the standard protocol without IV contrast. RADIATION DOSE REDUCTION: This exam was performed according to the departmental dose-optimization program which includes automated exposure control, adjustment of the mA and/or kV according to patient size and/or use of iterative reconstruction technique. COMPARISON:  MRI 12/16/2023, CT 12/15/2023, 04/06/2023 FINDINGS: Lower chest: Lung bases demonstrate scarring or atelectasis at the lung bases. Partially visualized catheter in the  right atrium. Trace pericardial effusion. Mitral calcification. Hepatobiliary: Interim placement of biliary stent, proximal part of the stent is in the common bile duct, distal portion of the stent is in the third portion of duodenum. There is residual intra hepatic biliary dilatation. Focal ill-defined hypodense areas within the periphery of the liver, most evident in the right hepatic lobe, measuring up to 2.3 cm by 4.9 cm inferiorly on the right, series 2, image 33. Common bile duct diameter 2.2 cm, air in the common bile duct. Pancreas: Unremarkable. No pancreatic ductal dilatation or surrounding inflammatory changes. Spleen: Normal in size without focal abnormality. Adrenals/Urinary Tract: Right adrenal gland is normal. 2 cm left adrenal nodule, stable and presumably representing adenoma. Atrophic kidneys without hydronephrosis. Renal cysts for which no imaging follow-up is recommended. Small hyperdense subcentimeter lesions at the mid left kidney too small to further characterize, no specific imaging  follow-up is recommended. Thick walled appearance of the urinary bladder Stomach/Bowel: Gastrostomy tube with tip in the body of the stomach. Intra-abdominal portion of tubing appears to course along the anterior wall of the splenic flexure with some haziness around tubing but no frank perforation of the colon. There is no dilated small bowel. Colon is dilated and contains both fluid and large stool mixed with high density material. There may be some colon wall thickening at the rectosigmoid colon. Vascular/Lymphatic: Aortic atherosclerosis. No enlarged abdominal or pelvic lymph nodes. Reproductive: Hysterectomy.  No adnexal mass Other: No free air. No significant ascites. Presacral edema, small volume fluid, and stranding. Musculoskeletal: Posterior spinal fusion hardware with interbody device at L4-L5. Chronic irregular degenerative change at L5-S1. Sacral coccygeal decubitus ulcer extends down to the bone. Mild erosive change at the sacrococcygeal region concerning for osteomyelitis. Coccygeal segments are displaced anteriorly into the pelvis, posterior to the rectum with surrounding fluid, sagittal series 6, image 65. Generalized subcutaneous edema. Evidence of prior ventral hernia repair. Chronic advanced degenerative change of the right hip with suspected AVN and mild fragmentation of femoral head. IMPRESSION: 1. Interim placement of biliary stent with residual intra and extrahepatic biliary dilatation. Ill-defined hypodense areas within the periphery of the liver, most evident in the right hepatic lobe, measuring up to 4.9 cm inferiorly on the right. Findings appear progressive compared with the CT from 12/15/2023, and as suggested on the interval MRI, could represent progressively dilated biliary radicles versus intra hepatic abscesses, underlying neoplastic process not excluded. 2. Sacral coccygeal decubitus ulcer extends down to the bone. Mild erosive change at the sacrococcygeal region concerning for  osteomyelitis. Coccygeal bone segments/fragments are displaced anteriorly into the pelvis, posterior to the rectum with surrounding fluid. 3. Gastrostomy tube with tip in the body of the stomach. Intra-abdominal portion of tubing appears to course along the anterior wall of the splenic flexure with some haziness around tubing but no frank perforation of the colon. 4. Dilated colon containing both fluid and large stool mixed with high density material. Large fecal impaction at the rectum. There may be some colon wall thickening at the rectosigmoid colon, question colitis. 5. Decompressed thick walled appearance of the urinary bladder, question cystitis. 6. Atrophic kidneys. 7. 2 cm left adrenal nodule, stable and presumably representing adenoma. 8. Aortic atherosclerosis. Aortic Atherosclerosis (ICD10-I70.0). Electronically Signed   By: Luke Bun M.D.   On: 01/15/2024 15:30   CT Head Wo Contrast Result Date: 01/15/2024 CLINICAL DATA:  Delirium hypotensive unresponsive EXAM: CT HEAD WITHOUT CONTRAST TECHNIQUE: Contiguous axial images were obtained from the base of the skull through  the vertex without intravenous contrast. RADIATION DOSE REDUCTION: This exam was performed according to the departmental dose-optimization program which includes automated exposure control, adjustment of the mA and/or kV according to patient size and/or use of iterative reconstruction technique. COMPARISON:  CT brain 12/15/2023, MRI 09/20/2023 FINDINGS: Brain: No acute territorial infarction, hemorrhage or intracranial mass. Atrophy and moderate advanced chronic small vessel ischemic changes of the white matter. Stable ventricle size. Small chronic infarcts in the left basal ganglia and cerebellum. Vascular: No hyperdense vessels.  Carotid vascular calcification Skull: Partially excluded right frontal bone.  No obvious fracture Sinuses/Orbits: Partially excluded right orbit. No obvious acute abnormality Other: None IMPRESSION: 1. No  CT evidence for acute intracranial abnormality. 2. Atrophy and chronic small vessel ischemic changes of the white matter. Small chronic infarcts in the left basal ganglia and cerebellum. Electronically Signed   By: Luke Bun M.D.   On: 01/15/2024 15:05   DG Chest Port 1 View Result Date: 01/15/2024 CLINICAL DATA:  Hypotensive in unresponsive. Evaluate for abnormality. EXAM: PORTABLE CHEST 1 VIEW COMPARISON:  CT 12/15/2023 FINDINGS: There is a right chest wall dialysis catheter with tips in the right atrium. Heart size is normal. Aortic atherosclerotic calcifications. Linear scar versus platelike atelectasis noted within the lung bases. No pleural effusion, interstitial edema or consolidative change. Postop change from ACDF in the lower cervical spine. IMPRESSION: 1. Bibasilar scar versus platelike atelectasis. Electronically Signed   By: Waddell Calk M.D.   On: 01/15/2024 13:17       Impression / Plan:    68 y.o. lady with history of ESRD on hemodialysis, HFpEF, bipolar, opioid use disorder, hypertension, Hep c (with cure, no evidence of cirrhosis), and arthritis here with e. Coli bacteremia of unknown source. Given history I suspect changes in the liver are secondary to repeat infection, likely from her sacral decub. MRCP in March did not note these changes but they have appeared since then. They could indeed be abscess's so will need repeat imaging with contrast (MRI vs CT). Regardless, will need to treat sacral decub as best as possible.  - recommend contrasted study (MRI vs CT) when clinically possible. She has been on dialysis for six years so unclear how much renal recovery we can reasonable expect. Can discuss with Nephrology - recommend ID consult, especially given concerns for osteomyelitis - f/u surgery recs - supportive care - daily liver labs including INR - consider palliative care consult  Dr. Jinny will takeover care tomorrow.  Frederic Schick MD, MPH [facility]

## 2024-01-16 NOTE — Evaluation (Signed)
 Occupational Therapy Evaluation Patient Details Name: Sherry [name] MRN: [id number] DOB: June 27, 1956 Today's Date: 01/16/2024   History of Present Illness   Patient is a 68 year old female found to be unresponsive and hypotensive upon return to facility from dialysis. PMH: ESRD on HD,  seizure disorder, chronic hepatitis C, opioid use disorder, stroke, status post gastrostomy tube, hypertension, diastolic congestive heart failure, depression with anxiety, bipolar, breast cancer status post right mastectomy     Clinical Impressions Pt seen for OT evaluation. Pt obtunded, opens eyes briefly only when turning to the L side then falls back asleep. Sister present and notes at rehab she was sitting up in a chair for a few hours at a time but strapped in. Pt required TOTAL A +2 for bed mobility this date. Globally weak and difficult to obtain formal assessment 2/2 decreased LOC. Recommend additional therapy to optimize independence and further assess ADL/mobility.      If plan is discharge home, recommend the following:   Two people to help with walking and/or transfers;Two people to help with bathing/dressing/bathroom;Direct supervision/assist for medications management;Supervision due to cognitive status;Direct supervision/assist for financial management;Assist for transportation;Assistance with cooking/housework;Assistance with feeding;Help with stairs or ramp for entrance     Functional Status Assessment   Patient has had a recent decline in their functional status and demonstrates the ability to make significant improvements in function in a reasonable and predictable amount of time.     Equipment Recommendations   Other (comment) (defer)     Recommendations for Other Services         Precautions/Restrictions   Precautions Precautions: Fall Recall of Precautions/Restrictions: Impaired Precaution/Restrictions Comments: sacral decubitus Restrictions Weight Bearing Restrictions  Per Provider Order: No     Mobility Bed Mobility Overal bed mobility: Needs Assistance Bed Mobility: Rolling Rolling: Total assist, +2 for physical assistance         General bed mobility comments: no active participation with bed mobility efforts    Transfers                          Balance                                           ADL either performed or assessed with clinical judgement   ADL Overall ADL's : Needs assistance/impaired                                       General ADL Comments: Pt currently TOTAL A for all ADL Tasks     Vision         Perception         Praxis         Pertinent Vitals/Pain Pain Assessment Pain Assessment: CPOT Facial Expression: Relaxed, neutral Body Movements: Protection Muscle Tension: Tense, rigid Compliance with ventilator (intubated pts.): N/A Vocalization (extubated pts.): Sighing, moaning CPOT Total: 3 Pain Intervention(s): Monitored during session, Repositioned, Premedicated before session     Extremity/Trunk Assessment Upper Extremity Assessment Upper Extremity Assessment: Generalized weakness;Difficult to assess due to impaired cognition (difficulty with functional assessment 2/2 lethargic)   Lower Extremity Assessment Lower Extremity Assessment: Generalized weakness;Difficult to assess due to impaired cognition       Communication Communication Communication: Impaired Factors Affecting Communication:  Difficulty expressing self   Cognition Arousal: Obtunded Behavior During Therapy: Flat affect Cognition: Difficult to assess Difficult to assess due to: Level of arousal                             Following commands: Impaired Following commands impaired:  (unable to follow)     Cueing  General Comments   Cueing Techniques: Verbal cues;Tactile cues  Pt opens eyes briefly and moans with repositioning. No increased alertness with stimulation  otherwise including light touch, voice, elevating bed, and gentle sternal rubs   Exercises     Shoulder Instructions      Home Living Family/patient expects to be discharged to:: Skilled nursing facility                                        Prior Functioning/Environment Prior Level of Function : Needs assist             Mobility Comments: at least one person assistance for bed mobility, transfers with a lift ADLs Comments: assistance required, able to feed herself briefly but fatigues quickly    OT Problem List: Decreased strength;Pain;Decreased cognition;Impaired balance (sitting and/or standing);Decreased knowledge of use of DME or AE;Decreased activity tolerance   OT Treatment/Interventions: Self-care/ADL training;Therapeutic activities;Therapeutic exercise;Energy conservation;DME and/or AE instruction;Patient/family education;Balance training;Manual therapy      OT Goals(Current goals can be found in the care plan section)   Acute Rehab OT Goals Patient Stated Goal: sister wants pt to improve OT Goal Formulation: With family Time For Goal Achievement: 01/30/24 Potential to Achieve Goals: Poor ADL Goals Pt Will Perform Grooming: bed level;with min assist;with mod assist (supported long sitting in bed, AE PRN) Additional ADL Goal #1: Pt will attend to ADL task for at least with eyes open and PRN VC for alertness/attention.   OT Frequency:  Min 1X/week    Co-evaluation PT/OT/SLP Co-Evaluation/Treatment: Yes Reason for Co-Treatment: Complexity of the patient's impairments (multi-system involvement);For patient/therapist safety PT goals addressed during session: Mobility/safety with mobility OT goals addressed during session: ADL's and self-care      AM-PAC OT 6 Clicks Daily Activity     Outcome Measure Help from another person eating meals?: Total Help from another person taking care of personal grooming?: Total Help from another person  toileting, which includes using toliet, bedpan, or urinal?: Total Help from another person bathing (including washing, rinsing, drying)?: Total Help from another person to put on and taking off regular upper body clothing?: Total Help from another person to put on and taking off regular lower body clothing?: Total 6 Click Score: 6   End of Session    Activity Tolerance: Patient limited by lethargy Patient left: in bed;with call bell/phone within reach;with bed alarm set;with family/visitor present  OT Visit Diagnosis: Other abnormalities of gait and mobility (R26.89);Muscle weakness (generalized) (M62.81)                Time: 8849-8790 OT Time Calculation (min): 19 min Charges:  OT General Charges $OT Visit: 1 Visit OT Evaluation $OT Eval Moderate Complexity: 1 Mod  Warren SAUNDERS., MPH, MS, OTR/L ascom [phone] 01/16/24, 2:36 PM

## 2024-01-16 NOTE — Progress Notes (Signed)
 Family members, [name], (sister) and [name] (niece), called multiple times requesting to talk to the nurse. They called back again and requested to talk to the charge nurse. I spoke to the family (both on the line) and answered their questions. One of the questions is how the pt is getting her nutrition? I asked if they have not speak to the provider at all or got any update earlier, Sherry [name]; the sister visited the patient this morning. I educated the family to be here in the morning for an update from the provider, and to talk to the provider about nutrition needs. I will make the primary nurse aware.

## 2024-01-16 NOTE — Consult Note (Signed)
 ADDENDUM TO INITIAL WOC CONSULT NOTE 01/15/2024;   Patient has since been evaluated by surgeon who does not feel patient appropriate for surgical debridement.  In his note did recommend use of Santyl , will finish out 3 days of Dakins (which will end 01/18/2024) and begin Santyl  01/19/2024 for ongoing debridement.   Orders placed as above.    Thank you,    Powell Bar MSN, RN-BC, [employer]

## 2024-01-16 NOTE — Progress Notes (Signed)
 [facility]  ROUNDING NOTE   Subjective:   [name] was admitted to [facility] on 01/15/2024 for Sepsis [facility]) [A41.9]  Patient received her complete hemodialysis treatment on day of admission which was recently lowered to 3 hours. Patient was premedicated with haloperidol , gabapentin  and oxycodone  before her treatment.   Today, remains with confusion and altered mental status.   E. Coli in blood cultures.   Objective:  Vital signs in last 24 hours:  Temp:  [97.9 F (36.6 C)-98.8 F (37.1 C)] 98.6 F (37 C) (07/06 1102) Pulse Rate:  [80-100] 92 (07/06 1100) Resp:  [13-33] 13 (07/06 1100) BP: (81-149)/(55-86) 125/65 (07/06 1100) SpO2:  [97 %-100 %] 99 % (07/06 1100) Weight:  [53.6 kg-54.3 kg] 53.6 kg (07/05 1539)  Weight change:  Filed Weights   01/15/24 1203 01/15/24 1539  Weight: 54.3 kg 53.6 kg    Intake/Output: I/O last 3 completed shifts: In: 1495.4 [IV Piggyback:1495.4] Out: -    Intake/Output this shift:  No intake/output data recorded.  Physical Exam: General: Ill appearing  Head: Normocephalic, atraumatic. Moist oral mucosal membranes  Eyes: Anicteric, PERRL  Neck: Supple, trachea midline  Lungs:  Clear to auscultation  Heart: Regular rate and rhythm  Abdomen:  Soft, nontender,   Extremities:  no peripheral edema.  Neurologic: Not following commands, altered mental status.   Skin: No lesions  Access: RIJ permcath    Basic Metabolic Panel: Recent Labs  Lab 01/15/24 1300 01/16/24 0419  NA 134* 135  K 3.7 3.7  CL 95* 97*  CO2 21* 20*  GLUCOSE 148* 104*  BUN 33* 45*  CREATININE 3.10* 3.14*  CALCIUM  8.3* 8.2*  MG  --  2.1  PHOS  --  4.7*    Liver Function Tests: Recent Labs  Lab 01/15/24 1300 01/16/24 0419  AST 89*  --   ALT 26  --   ALKPHOS 862*  --   BILITOT 0.7  --   PROT 7.4  --   ALBUMIN 2.3* 2.1*   Recent Labs  Lab 01/15/24 1300  LIPASE 32   No results for input(s): AMMONIA in the last 168  hours.  CBC: Recent Labs  Lab 01/15/24 1300 01/16/24 0419  WBC 21.6* 16.0*  NEUTROABS 20.3*  --   HGB 9.1* 9.2*  HCT 29.7* 29.2*  MCV 93.4 89.8  PLT 434* 398    Cardiac Enzymes: No results for input(s): CKTOTAL, CKMB, CKMBINDEX, TROPONINI in the last 168 hours.  BNP: Invalid input(s): POCBNP  CBG: Recent Labs  Lab 01/15/24 1235 01/15/24 1539  GLUCAP 110* 115*    Microbiology: Results for orders placed or performed during the hospital encounter of 01/15/24  Blood Culture (routine x 2)     Status: None (Preliminary result)   Collection Time: 01/15/24 12:42 PM   Specimen: BLOOD  Result Value Ref Range Status   Specimen Description BLOOD RIGHT ANTECUBITAL  Final   Special Requests   Final    BOTTLES DRAWN AEROBIC AND ANAEROBIC Blood Culture results may not be optimal due to an inadequate volume of blood received in culture bottles   Culture  Setup Time   Final    GRAM NEGATIVE RODS AEROBIC BOTTLE ONLY CRITICAL VALUE NOTED.  VALUE IS CONSISTENT WITH PREVIOUSLY REPORTED AND CALLED VALUE. Performed at [facility], [address] [place], [place] [address]    Culture GRAM NEGATIVE RODS  Final   Report Status PENDING  Incomplete  Blood Culture (routine x 2)  Status: None (Preliminary result)   Collection Time: 01/15/24 12:42 PM   Specimen: BLOOD  Result Value Ref Range Status   Specimen Description   Final    BLOOD LEFT ANTECUBITAL Performed at [facility], [address] [place], [place] [address]    Special Requests   Final    BOTTLES DRAWN AEROBIC AND ANAEROBIC Blood Culture results may not be optimal due to an inadequate volume of blood received in culture bottles Performed at [facility], [address], [place], [place] [address]    Culture  Setup Time   Final    GRAM NEGATIVE RODS AEROBIC BOTTLE ONLY CRITICAL RESULT CALLED TO, READ BACK BY AND VERIFIED WITH: SELINDA SIMPERS, PHARMD @0132  01/16/2024  COP Performed at [facility], [address] [place], [place] [address]    Culture GRAM NEGATIVE RODS  Final   Report Status PENDING  Incomplete  Blood Culture ID Panel (Reflexed)     Status: Abnormal   Collection Time: 01/15/24 12:42 PM  Result Value Ref Range Status   Enterococcus faecalis NOT DETECTED NOT DETECTED Final   Enterococcus Faecium NOT DETECTED NOT DETECTED Final   Listeria monocytogenes NOT DETECTED NOT DETECTED Final   Staphylococcus species NOT DETECTED NOT DETECTED Final   Staphylococcus aureus (BCID) NOT DETECTED NOT DETECTED Final   Staphylococcus epidermidis NOT DETECTED NOT DETECTED Final   Staphylococcus lugdunensis NOT DETECTED NOT DETECTED Final   Streptococcus species NOT DETECTED NOT DETECTED Final   Streptococcus agalactiae NOT DETECTED NOT DETECTED Final   Streptococcus pneumoniae NOT DETECTED NOT DETECTED Final   Streptococcus pyogenes NOT DETECTED NOT DETECTED Final   A.calcoaceticus-baumannii NOT DETECTED NOT DETECTED Final   Bacteroides fragilis NOT DETECTED NOT DETECTED Final   Enterobacterales DETECTED (A) NOT DETECTED Final    Comment: Enterobacterales represent a large order of gram negative bacteria, not a single organism. CRITICAL RESULT CALLED TO, READ BACK BY AND VERIFIED WITH: JASON ROBBINS, PHARMD @0132  01/16/2024 COP    Enterobacter cloacae complex NOT DETECTED NOT DETECTED Final   Escherichia coli DETECTED (A) NOT DETECTED Final    Comment: CRITICAL RESULT CALLED TO, READ BACK BY AND VERIFIED WITH: JASON ROBBINS, PHARMD @0132  01/16/2024 COP    Klebsiella aerogenes NOT DETECTED NOT DETECTED Final   Klebsiella oxytoca NOT DETECTED NOT DETECTED Final   Klebsiella pneumoniae NOT DETECTED NOT DETECTED Final   Proteus species NOT DETECTED NOT DETECTED Final   Salmonella species NOT DETECTED NOT DETECTED Final   Serratia marcescens NOT DETECTED NOT DETECTED Final   Haemophilus influenzae NOT DETECTED NOT DETECTED Final   Neisseria  meningitidis NOT DETECTED NOT DETECTED Final   Pseudomonas aeruginosa NOT DETECTED NOT DETECTED Final   Stenotrophomonas maltophilia NOT DETECTED NOT DETECTED Final   Candida albicans NOT DETECTED NOT DETECTED Final   Candida auris NOT DETECTED NOT DETECTED Final   Candida glabrata NOT DETECTED NOT DETECTED Final   Candida krusei NOT DETECTED NOT DETECTED Final   Candida parapsilosis NOT DETECTED NOT DETECTED Final   Candida tropicalis NOT DETECTED NOT DETECTED Final   Cryptococcus neoformans/gattii NOT DETECTED NOT DETECTED Final   CTX-M ESBL NOT DETECTED NOT DETECTED Final   Carbapenem resistance IMP NOT DETECTED NOT DETECTED Final   Carbapenem resistance KPC NOT DETECTED NOT DETECTED Final   Carbapenem resistance NDM NOT DETECTED NOT DETECTED Final   Carbapenem resist OXA 48 LIKE NOT DETECTED NOT DETECTED Final   Carbapenem resistance VIM NOT DETECTED NOT DETECTED Final    Comment:  Performed at [facility], [address] [place], [place] [address]  MRSA Next Gen by PCR, Nasal     Status: None   Collection Time: 01/15/24  3:40 PM   Specimen: Nasal Mucosa; Nasal Swab  Result Value Ref Range Status   MRSA by PCR Next Gen NOT DETECTED NOT DETECTED Final    Comment: (NOTE) The GeneXpert MRSA Assay (FDA approved for NASAL specimens only), is one component of a comprehensive MRSA colonization surveillance program. It is not intended to diagnose MRSA infection nor to guide or monitor treatment for MRSA infections. Test performance is not FDA approved in patients less than 75 years old. Performed at [facility], [address] [place], [place] [address]     Coagulation Studies: Recent Labs    01/15/24 1300  LABPROT 14.7  INR 1.1    Urinalysis: No results for input(s): COLORURINE, LABSPEC, PHURINE, GLUCOSEU, HGBUR, BILIRUBINUR, KETONESUR, PROTEINUR, UROBILINOGEN, NITRITE, LEUKOCYTESUR in the last 72 hours.  Invalid input(s):  APPERANCEUR    Imaging: CT ABDOMEN PELVIS WO CONTRAST Result Date: 01/15/2024 CLINICAL DATA:  Abdomen pain unresponsive EXAM: CT ABDOMEN AND PELVIS WITHOUT CONTRAST TECHNIQUE: Multidetector CT imaging of the abdomen and pelvis was performed following the standard protocol without IV contrast. RADIATION DOSE REDUCTION: This exam was performed according to the departmental dose-optimization program which includes automated exposure control, adjustment of the mA and/or kV according to patient size and/or use of iterative reconstruction technique. COMPARISON:  MRI 12/16/2023, CT 12/15/2023, 04/06/2023 FINDINGS: Lower chest: Lung bases demonstrate scarring or atelectasis at the lung bases. Partially visualized catheter in the right atrium. Trace pericardial effusion. Mitral calcification. Hepatobiliary: Interim placement of biliary stent, proximal part of the stent is in the common bile duct, distal portion of the stent is in the third portion of duodenum. There is residual intra hepatic biliary dilatation. Focal ill-defined hypodense areas within the periphery of the liver, most evident in the right hepatic lobe, measuring up to 2.3 cm by 4.9 cm inferiorly on the right, series 2, image 33. Common bile duct diameter 2.2 cm, air in the common bile duct. Pancreas: Unremarkable. No pancreatic ductal dilatation or surrounding inflammatory changes. Spleen: Normal in size without focal abnormality. Adrenals/Urinary Tract: Right adrenal gland is normal. 2 cm left adrenal nodule, stable and presumably representing adenoma. Atrophic kidneys without hydronephrosis. Renal cysts for which no imaging follow-up is recommended. Small hyperdense subcentimeter lesions at the mid left kidney too small to further characterize, no specific imaging follow-up is recommended. Thick walled appearance of the urinary bladder Stomach/Bowel: Gastrostomy tube with tip in the body of the stomach. Intra-abdominal portion of tubing appears to  course along the anterior wall of the splenic flexure with some haziness around tubing but no frank perforation of the colon. There is no dilated small bowel. Colon is dilated and contains both fluid and large stool mixed with high density material. There may be some colon wall thickening at the rectosigmoid colon. Vascular/Lymphatic: Aortic atherosclerosis. No enlarged abdominal or pelvic lymph nodes. Reproductive: Hysterectomy.  No adnexal mass Other: No free air. No significant ascites. Presacral edema, small volume fluid, and stranding. Musculoskeletal: Posterior spinal fusion hardware with interbody device at L4-L5. Chronic irregular degenerative change at L5-S1. Sacral coccygeal decubitus ulcer extends down to the bone. Mild erosive change at the sacrococcygeal region concerning for osteomyelitis. Coccygeal segments are displaced anteriorly into the pelvis, posterior to the rectum with surrounding fluid, sagittal series 6, image 65. Generalized subcutaneous edema. Evidence of prior ventral  hernia repair. Chronic advanced degenerative change of the right hip with suspected AVN and mild fragmentation of femoral head. IMPRESSION: 1. Interim placement of biliary stent with residual intra and extrahepatic biliary dilatation. Ill-defined hypodense areas within the periphery of the liver, most evident in the right hepatic lobe, measuring up to 4.9 cm inferiorly on the right. Findings appear progressive compared with the CT from 12/15/2023, and as suggested on the interval MRI, could represent progressively dilated biliary radicles versus intra hepatic abscesses, underlying neoplastic process not excluded. 2. Sacral coccygeal decubitus ulcer extends down to the bone. Mild erosive change at the sacrococcygeal region concerning for osteomyelitis. Coccygeal bone segments/fragments are displaced anteriorly into the pelvis, posterior to the rectum with surrounding fluid. 3. Gastrostomy tube with tip in the body of the  stomach. Intra-abdominal portion of tubing appears to course along the anterior wall of the splenic flexure with some haziness around tubing but no frank perforation of the colon. 4. Dilated colon containing both fluid and large stool mixed with high density material. Large fecal impaction at the rectum. There may be some colon wall thickening at the rectosigmoid colon, question colitis. 5. Decompressed thick walled appearance of the urinary bladder, question cystitis. 6. Atrophic kidneys. 7. 2 cm left adrenal nodule, stable and presumably representing adenoma. 8. Aortic atherosclerosis. Aortic Atherosclerosis (ICD10-I70.0). Electronically Signed   By: Luke Bun M.D.   On: 01/15/2024 15:30   CT Head Wo Contrast Result Date: 01/15/2024 CLINICAL DATA:  Delirium hypotensive unresponsive EXAM: CT HEAD WITHOUT CONTRAST TECHNIQUE: Contiguous axial images were obtained from the base of the skull through the vertex without intravenous contrast. RADIATION DOSE REDUCTION: This exam was performed according to the departmental dose-optimization program which includes automated exposure control, adjustment of the mA and/or kV according to patient size and/or use of iterative reconstruction technique. COMPARISON:  CT brain 12/15/2023, MRI 09/20/2023 FINDINGS: Brain: No acute territorial infarction, hemorrhage or intracranial mass. Atrophy and moderate advanced chronic small vessel ischemic changes of the white matter. Stable ventricle size. Small chronic infarcts in the left basal ganglia and cerebellum. Vascular: No hyperdense vessels.  Carotid vascular calcification Skull: Partially excluded right frontal bone.  No obvious fracture Sinuses/Orbits: Partially excluded right orbit. No obvious acute abnormality Other: None IMPRESSION: 1. No CT evidence for acute intracranial abnormality. 2. Atrophy and chronic small vessel ischemic changes of the white matter. Small chronic infarcts in the left basal ganglia and cerebellum.  Electronically Signed   By: Luke Bun M.D.   On: 01/15/2024 15:05   DG Chest Port 1 View Result Date: 01/15/2024 CLINICAL DATA:  Hypotensive in unresponsive. Evaluate for abnormality. EXAM: PORTABLE CHEST 1 VIEW COMPARISON:  CT 12/15/2023 FINDINGS: There is a right chest wall dialysis catheter with tips in the right atrium. Heart size is normal. Aortic atherosclerotic calcifications. Linear scar versus platelike atelectasis noted within the lung bases. No pleural effusion, interstitial edema or consolidative change. Postop change from ACDF in the lower cervical spine. IMPRESSION: 1. Bibasilar scar versus platelike atelectasis. Electronically Signed   By: Waddell Calk M.D.   On: 01/15/2024 13:17     Medications:    cefTRIAXone  (ROCEPHIN )  IV Stopped (01/16/24 0313)    ascorbic acid   500 mg Per Tube BID   Chlorhexidine  Gluconate Cloth  6 each Topical Daily   cloBAZam   5 mg Per Tube Q12H   fentaNYL  (SUBLIMAZE ) injection  25 mcg Intravenous Once   [START ON 01/18/2024] haloperidol   1 mg Per Tube Once per day on  Tuesday Saturday   heparin  injection (subcutaneous)  5,000 Units Subcutaneous Q8H   lacosamide   200 mg Per Tube BID   levETIRAcetam   500 mg Per Tube BID   liver oil-zinc  oxide   Topical BID   pantoprazole   40 mg Oral Daily   sertraline   25 mg Per Tube Daily   sodium hypochlorite   Irrigation Daily   white petrolatum    Topical BID   acetaminophen , docusate sodium , HYDROmorphone  (DILAUDID ) injection, HYDROmorphone , hydrOXYzine , LORazepam , ondansetron  (ZOFRAN ) IV, mouth rinse, polyethylene glycol  Assessment/ Plan:  [name] is a 68 y.o.  female with end stage renal disease on hemodialysis, seizure disorders, congestive heart failure, depression, bipolar disorder, and breast cancer who is admitted to [facility] on 01/15/2024 for Sepsis [facility]) [A41.9]  [facility] RIJ permcath 56kg.   End Stage Renal Disease: no indication for dialysis.  - consult vascular to  evaluate dialysis catheter since patient with bacteremia.  - will hold dialysis for now.   Hypotension: with sepsis - appreciate ICU input.  - responsive to IV fluids. Not on vasopressors. - broad spectrum antibiotics.   Anemia with chronic kidney disease: on Mircera as outpatient.   Secondary Hyperparathyroidism: sevelamer  with meals when she is able to tolerate PO.    LOS: 1 Roger Fasnacht 7/6/202511:45 AM

## 2024-01-16 NOTE — Consult Note (Signed)
 Consultation Note Date: 01/16/2024 at 1450  Patient Name: Sherry [name]  DOB: [birth year]  MRN: [id number]  Age / Sex: 68 y.o., female  PCP: Caleen Dirks, MD Referring Physician: Von Bellis, MD  HPI/Patient Profile: 68 y.o. female  with past medical history significant for ESRD on HD, seizure disorder, chroninc hepatitis C, opioid use disorder, CVA, s/p gastrostomy tube, HTN, dCHF, depression with anxiety, bipolar, breast cancer s/p R mastectomy. She presented to [facility] ED from [facility] 01/15/2024 with c/o AMS and hypotension. It was reported that patient had received dialysis PTA and was somnolent throughout dialysis session.   ED workup resulted lactic 6.1, WBC 21.6, Hgb 9.1, Hct 29.7, plts 434, creatinine 3.1, BUN 33 and K+ 3.7. CXR negative.  BP 81/71, HR 100, RR 16, SpO2 100% RA, 98.6.   Of note, patient was previously admitted to [facility] 6/4-6/13/25 with E.coli bacteremia and signs of cholangitis. She underwent ERCP 6/6 that found choledocholithiasis. Sphincterotomy was performed and plastic stent placed.   She was admitted for management of sepsis, sacral wound with signs of sacral osteomyelitis.   Palliative team consulted for assistance with goals of care conversations.    Clinical Assessment and Goals of Care:  Ill-appearing, female resting in bed. She is not responsive to verbal/tactile stimuli. Even, unlabored respirations. She is in no distress. No family at bedside.   Extensive chart review completed prior to meeting patient including labs, vital signs, imaging, progress notes, orders, and available advanced directive documents from current and previous encounters. I then met with [name] (sister) and Reggy (nephew) via phone to discuss diagnosis prognosis, GOC, EOL wishes, disposition and options.  I introduced Palliative Medicine as specialized medical care for people living with serious  illness. It focuses on providing relief from the symptoms and stress of a serious illness. The goal is to improve quality of life for both the patient and the family.  We discussed a brief life review of the patient. Mercer reports that the patient has no children and no other family members. She shares that she has watched her sister's health decline over the past 6 months. Mercer shares that her sister resides at [facility], is bedbound and receives nutrition via feeding tube.   We discussed patient's current illness and what it means in the larger context of patient's on-going co-morbidities.  Natural disease trajectory and expectations at EOL were discussed. [name] and [place] state they understand that Elton is very ill and are aware what is going on.   I attempted to elicit values and goals of care important to the patient [name] shares that patient always said not to give up on her.   The difference between aggressive medical intervention and comfort care was considered in light of the patient's goals of care.   Advance directives, concepts specific to code status, artificial feeding and hydration, and rehospitalization were considered and discussed. Family requests to continue all medical interventions to include full code status. They want to allow time to see if Shanena responds to treatment.  Education offered regarding concept specific to human mortality and the limitations of medical interventions to prolong life when the body begins to fail to thrive. Both [name] and [place] verbalize understanding of the critical nature of Nitzia's illness.   Family is facing treatment option decisions, advanced directive, and anticipatory care needs. Clinton shares that he wants to continue all interventions, but will reconsider decisions if they feel the patient is suffering. He continues that he just went through the same situation with critical illness and death of his father one year ago.     Discussed with patient/family the importance of continued conversation with family and the medical providers regarding overall plan of care and treatment options, ensuring decisions are within the context of the patient's values and GOCs.    Questions and concerns were addressed. The family was encouraged to call with questions or concerns.   Primary Decision Maker NEXT OF KIN- Berwyn Hint- sister  Physical Exam Vitals reviewed.  Constitutional:      General: She is not in acute distress.    Appearance: She is ill-appearing. She is not toxic-appearing.  Pulmonary:     Effort: Pulmonary effort is normal. No respiratory distress.  Musculoskeletal:     Right lower leg: No edema.     Left lower leg: No edema.  Skin:    General: Skin is warm and dry.   Recommendations/Plan: FULL CODE status as previously documented    Continue current supportive interventions Allow time for outcomes  Palliative will follow to continue GOC conversations  Palliative Assessment/Data: 10-20%   Discussed plan of care with primary RN.   Thank you for this consult. Palliative medicine will continue to follow and assist holistically.   Time Total: 75 minutes  Time spent includes: Detailed review of medical records (labs, imaging, vital signs), medically appropriate exam (mental status, respiratory, cardiac, skin), discussed with treatment team, counseling and educating patient, family and staff, documenting clinical information, medication management and coordination of care.     Devere Sacks, AMANDA [facility] Palliative Medicine Team  01/16/2024 2:47 PM  Office [phone]  Pager [phone]     Please contact Palliative Medicine Team providers via AMION for questions and concerns.

## 2024-01-16 NOTE — Progress Notes (Signed)
 PHARMACY - PHYSICIAN COMMUNICATION CRITICAL VALUE ALERT - BLOOD CULTURE IDENTIFICATION (BCID)  Sherry [name] is an 68 y.o. female who presented to [facility] on 01/15/2024 with a chief complaint of sepsis.   Assessment:  E Coli in 2 of 4 bottles , no resistance detected (include suspected source if known)  Name of physician (or Provider) Contacted: Almarie Nose, NP   Current antibiotics: Aztreonam , metronidazole    Changes to prescribed antibiotics recommended:  Recommendations accepted by provider - d/c current abx and start Ceftriaxone  2 gm IV Q24H   Results for orders placed or performed during the hospital encounter of 01/15/24  Blood Culture ID Panel (Reflexed) (Collected: 01/15/2024 12:42 PM)  Result Value Ref Range   Enterococcus faecalis NOT DETECTED NOT DETECTED   Enterococcus Faecium NOT DETECTED NOT DETECTED   Listeria monocytogenes NOT DETECTED NOT DETECTED   Staphylococcus species NOT DETECTED NOT DETECTED   Staphylococcus aureus (BCID) NOT DETECTED NOT DETECTED   Staphylococcus epidermidis NOT DETECTED NOT DETECTED   Staphylococcus lugdunensis NOT DETECTED NOT DETECTED   Streptococcus species NOT DETECTED NOT DETECTED   Streptococcus agalactiae NOT DETECTED NOT DETECTED   Streptococcus pneumoniae NOT DETECTED NOT DETECTED   Streptococcus pyogenes NOT DETECTED NOT DETECTED   A.calcoaceticus-baumannii NOT DETECTED NOT DETECTED   Bacteroides fragilis NOT DETECTED NOT DETECTED   Enterobacterales DETECTED (A) NOT DETECTED   Enterobacter cloacae complex NOT DETECTED NOT DETECTED   Escherichia coli DETECTED (A) NOT DETECTED   Klebsiella aerogenes NOT DETECTED NOT DETECTED   Klebsiella oxytoca NOT DETECTED NOT DETECTED   Klebsiella pneumoniae NOT DETECTED NOT DETECTED   Proteus species NOT DETECTED NOT DETECTED   Salmonella species NOT DETECTED NOT DETECTED   Serratia marcescens NOT DETECTED NOT DETECTED   Haemophilus influenzae NOT DETECTED NOT DETECTED   Neisseria  meningitidis NOT DETECTED NOT DETECTED   Pseudomonas aeruginosa NOT DETECTED NOT DETECTED   Stenotrophomonas maltophilia NOT DETECTED NOT DETECTED   Candida albicans NOT DETECTED NOT DETECTED   Candida auris NOT DETECTED NOT DETECTED   Candida glabrata NOT DETECTED NOT DETECTED   Candida krusei NOT DETECTED NOT DETECTED   Candida parapsilosis NOT DETECTED NOT DETECTED   Candida tropicalis NOT DETECTED NOT DETECTED   Cryptococcus neoformans/gattii NOT DETECTED NOT DETECTED   CTX-M ESBL NOT DETECTED NOT DETECTED   Carbapenem resistance IMP NOT DETECTED NOT DETECTED   Carbapenem resistance KPC NOT DETECTED NOT DETECTED   Carbapenem resistance NDM NOT DETECTED NOT DETECTED   Carbapenem resist OXA 48 LIKE NOT DETECTED NOT DETECTED   Carbapenem resistance VIM NOT DETECTED NOT DETECTED    Lowell Makara D 01/16/2024  2:42 AM

## 2024-01-16 NOTE — TOC Progression Note (Signed)
 Transition of Care [facility]) - Progression Note    Patient Details  Name: Serin Thornell MRN: [id number] Date of Birth: 05/17/1956  Transition of [facility]) CM/SW Contact  [place] L Adrionna Delcid, [place] Phone Number: 01/16/2024, 11:26 AM  Clinical Narrative:     TOC attempted to visit with patient but patient care was taking place. CSW made two attempts to contact relative listed in chart to complete brief assessment, readmission screen and discuss discharge planning to no avail.        Expected Discharge Plan and Services                                               Social Determinants of Health (SDOH) Interventions SDOH Screenings   Food Insecurity: Patient Unable To Answer (01/15/2024)  Housing: Patient Unable To Answer (01/15/2024)  Transportation Needs: Patient Unable To Answer (01/15/2024)  Utilities: Patient Unable To Answer (01/15/2024)  Alcohol  Screen: Medium Risk (08/27/2017)  Depression (PHQ2-9): Medium Risk (12/20/2020)  Social Connections: Patient Unable To Answer (01/15/2024)  Tobacco Use: High Risk (01/15/2024)    Readmission Risk Interventions    01/16/2024    9:55 AM  Readmission Risk Prevention Plan  Medication Review (RN Care Manager) Complete  PCP or Specialist appointment within 3-5 days of discharge Complete

## 2024-01-16 NOTE — Progress Notes (Signed)
 In and out cath performed to obtain UA. No urine returned.

## 2024-01-16 NOTE — Consult Note (Signed)
 Patient ID: Sherry [name], female   DOB: 1955-08-17, 68 y.o.   MRN: [id number]  HPI Sherry [name] is a 68 y.o. female y with history of ESRD on hemodialysis, HFpEF, bipolar, opioid use disorder, hypertension, Hep c (with cure, no evidence of cirrhosis), and arthritis here with e. Coli bacteremia of unknown source. Patient unable to provide history but history gathered from sister  She has been in and out of the hospital since January and has been basically bed bound since January. Initially this was due to hypertensive episodes and then PRES but she has now developed a stage IV decub. She recently was admitted here in June with e. Coli bacteremia and was treated as cholangitis where she underwent ERCP with biliary sphincterotomy which noted inflammatory strictures. She had sludge removed and a stent placed. She was discharged on two week course of antibiotics. She returns with sepsis from e. Coli. Non-contrast CT shows larger size of lesions in the right lobe of the liver concerning for dilated bilary radicles vs. Abscess, less likely malignancy. She underwent MRCP in March at [facility] that did not mention these lesions but did note dilated CBD. On the most recent hospitalization here is when the lesions were first noted.  Per family, at the rehab location it was not unusual for her sacral decub to get contaminated with stool.  Sister admits a decline in her condition over the last few weeks, she now has progressed to being encephalopathic and not able to provide history, [place] not follow commands   HPI  Past Medical History:  Diagnosis Date   Alcohol  use disorder, severe, dependence (HCC) 08/27/2017   Anxiety    Body mass index (BMI) 40.0-44.9, adult (HCC) 07/19/2020   Breast cancer (HCC)    right lumpectemy and lymph node    Cervical spondylosis 05/24/2020   Chronic bilateral low back pain with right-sided sciatica 05/24/2020   Chronic diastolic CHF (congestive heart failure) (HCC)  05/09/2019   Cigarette smoker 12/31/2017   ESRD (end stage renal disease) on dialysis (HCC) 08/22/2021   M-W-F   Essential hypertension 07/06/2017   GERD (gastroesophageal reflux disease)    Hepatitis C virus infection cured after antiviral drug therapy 12/17/2012   Telephone Encounter - Mercie Planas, RN - 12/28/2016 9:54 AM EDT Patient called for HCV RNA test results. EOT 08-26-16 - not detected. 12 week post treatment 12-11-16 - not detected. Informed patient that she was cured. Patient verbalized understanding.          Hx of bipolar disorder 01/31/2016   Impaired functional mobility, balance, gait, and endurance 01/09/2021   MRSA infection    of breast incision   Neuroleptic-induced tardive dyskinesia 09/06/2017   Abilify   Opioid use disorder, severe, dependence (HCC) 08/27/2017   Restless legs syndrome 02/13/2013   Formatting of this note might be different from the original. IMPRESSION: Possible. Will follow.   Subacute dyskinesia due to drug 02/13/2013   Formatting of this note might be different from the original. STORY: Tardive dyskinesia and mild limb dyskinesia, LE>UE which likely due to prolonged antipsychotic used, abilify. Couldn't tolerate artane, gabapentin , clonazepam and xenazine.`E1o3L`IMPRESSION: Well tolerated depakote  250mg  bid, mood aspect is better as well. Will increase to 500mg  bid. ADR was discussed.  RTC 4-6 weeks.   TIA (transient ischemic attack) 2020   P/w BP >230/120 and neurologic symptoms, presumed TIA    Past Surgical History:  Procedure Laterality Date   A/V FISTULAGRAM Right 01/29/2022   Procedure: A/V Fistulagram;  Surgeon: Gretta Lonni PARAS, MD;  Location: [facility] INVASIVE CV LAB;  Service: Cardiovascular;  Laterality: Right;   AV FISTULA PLACEMENT Right 05/01/2021   Procedure: RIGHT ARTERIOVENOUS (AV) FISTULA CREATION;  Surgeon: Lanis Fonda BRAVO, MD;  Location: [facility] OR;  Service: Vascular;  Laterality: Right;  PERIPHERAL NERVE BLOCK   BACK SURGERY  1990    BASCILIC VEIN TRANSPOSITION Right 09/02/2021   Procedure: RIGHT SECOND STAGE BASILIC VEIN TRANSPOSITION;  Surgeon: Lanis Fonda BRAVO, MD;  Location: [facility] OR;  Service: Vascular;  Laterality: Right;  PERIPHERAL NERVE BLOCK   BILIARY STENT PLACEMENT  12/17/2023   Procedure: INSERTION, STENT, BILE DUCT;  Surgeon: Jinny Carmine, MD;  Location: [facility] ENDOSCOPY;  Service: Endoscopy;;   BREAST SURGERY Right 2010   breast cancer survivor - states partial mastectomy and nodes   COLONOSCOPY     [facility]   ERCP N/A 12/17/2023   Procedure: ERCP, WITH INTERVENTION IF INDICATED;  Surgeon: Jinny Carmine, MD;  Location: [facility] ENDOSCOPY;  Service: Endoscopy;  Laterality: N/A;   ESOPHAGOGASTRODUODENOSCOPY     [facility]   ESOPHAGOGASTRODUODENOSCOPY  04/18/2020   [facility]   LAPAROSCOPIC CHOLECYSTECTOMY  2002   LAPAROSCOPIC INCISIONAL / UMBILICAL / VENTRAL HERNIA REPAIR  04/13/2018   with BARD 15x 20cm mesh (supraumbilical)   LAPAROSCOPIC LYSIS OF ADHESIONS  07/12/2018   Procedure: LAPAROSCOPIC LYSIS OF ADHESIONS;  Surgeon: Anitra Freddy NOVAK, MD;  Location: [facility] ORS;  Service: Gynecology;;   MULTIPLE TOOTH EXTRACTIONS     MYOMECTOMY     x 2 prior to hysterectomy   PERIPHERAL VASCULAR BALLOON ANGIOPLASTY Right 01/29/2022   Procedure: PERIPHERAL VASCULAR BALLOON ANGIOPLASTY;  Surgeon: Gretta Lonni PARAS, MD;  Location: [facility] INVASIVE CV LAB;  Service: Cardiovascular;  Laterality: Right;  arm fistula   ROBOTIC ASSISTED BILATERAL SALPINGO OOPHERECTOMY Right 07/12/2018   Procedure: XI ROBOTIC ASSISTED RIGHT SALPINGO OOPHORECTOMY;  Surgeon: Anitra Freddy NOVAK, MD;  Location: [facility] ORS;  Service: Gynecology;  Laterality: Right;   STONE EXTRACTION WITH BASKET  12/17/2023   Procedure: ERCP, WITH LITHROTRIPSY OR REMOVAL OF COMMON BILE DUCT CALCULUS USING BASKET;  Surgeon: Jinny Carmine, MD;  Location: [facility] ENDOSCOPY;  Service: Endoscopy;;   TOTAL ABDOMINAL HYSTERECTOMY     fibroids    UPPER GASTROINTESTINAL  ENDOSCOPY     WISDOM TOOTH EXTRACTION      Family History  Problem Relation Age of Onset   Heart attack Mother    Breast cancer Mother 48   Dementia Mother    Cancer Father 78       died of bleeding from kidneys   Colon cancer Neg Hx    Esophageal cancer Neg Hx    Stomach cancer Neg Hx    Rectal cancer Neg Hx     Social History Social History   Tobacco Use   Smoking status: Every Day    Current packs/day: 0.50    Types: Cigarettes    Passive exposure: Never   Smokeless tobacco: Never  Vaping Use   Vaping status: Former   Start date: 06/27/2013   Quit date: 06/14/2017   Devices: Apple Cinnamon-0 mg  Substance Use Topics   Alcohol  use: Not Currently    Comment: hx over 30 years   Drug use: Not Currently    Comment: h/o IVD use    Allergies  Allergen Reactions   Abilify [Aripiprazole] Other (See Comments)    Tardive dyskinesia Oral   Remeron  [Mirtazapine ] Other (See Comments)    Wgt stimulation /gain, Dizziness, Patient  says can tolerate   Trazodone  And Nefazodone Other (See Comments)    Nightmares/sleep diturbance   Augmentin [Amoxicillin -Pot Clavulanate]    Flexeril [Cyclobenzaprine] Other (See Comments)    Pt states Flexeril makes her feel depressed    Amoxicillin  Diarrhea    Current Facility-Administered Medications  Medication Dose Route Frequency Provider Last Rate Last Admin   acetaminophen  (TYLENOL ) tablet 650 mg  650 mg Per Tube Q6H PRN Von Bellis, MD       ascorbic acid  (VITAMIN C ) tablet 500 mg  500 mg Per Tube BID Von Bellis, MD   500 mg at 01/16/24 9071   cefTRIAXone  (ROCEPHIN ) 2 g in sodium chloride  0.9 % 100 mL IVPB  2 g Intravenous Q24H Kathrene Almarie Bake, NP   Stopped at 01/16/24 9686   Chlorhexidine  Gluconate Cloth 2 % PADS 6 each  6 each Topical Daily Assaker, Darrin, MD   6 each at 01/16/24 0930   cloBAZam  (ONFI ) 2.5 MG/ML oral suspension 5 mg  5 mg Per Tube Q12H Von Bellis, MD   5 mg at 01/16/24 9071   docusate sodium   (COLACE) capsule 100 mg  100 mg Oral BID PRN Assaker, Darrin, MD       fentaNYL  (SUBLIMAZE ) injection 25 mcg  25 mcg Intravenous Once Quale, Mark, MD       NOREEN ON 01/18/2024] haloperidol  (HALDOL ) tablet 1 mg  1 mg Per Tube Once per day on Tuesday Saturday Niels Kayla FALCON, [facility]       heparin  injection 5,000 Units  5,000 Units Subcutaneous Q8H Von Bellis, MD       HYDROmorphone  (DILAUDID ) injection 1 mg  1 mg Intravenous Q4H PRN Von Bellis, MD   1 mg at 01/16/24 9068   HYDROmorphone  (DILAUDID ) tablet 2 mg  2 mg Per Tube Q6H PRN Von Bellis, MD       hydrOXYzine  (ATARAX ) tablet 25 mg  25 mg Per Tube TID PRN Von Bellis, MD       lacosamide  (VIMPAT ) tablet 200 mg  200 mg Per Tube BID Von Bellis, MD   200 mg at 01/16/24 9072   levETIRAcetam  (KEPPRA ) 100 MG/ML solution 500 mg  500 mg Per Tube BID Von Bellis, MD   500 mg at 01/16/24 9071   liver oil-zinc  oxide (DESITIN) 40 % ointment   Topical BID Assaker, Darrin, MD   1 Application at 01/16/24 0930   LORazepam  (ATIVAN ) injection 2 mg  2 mg Intravenous Q5 Min x 3 PRN Von Bellis, MD       ondansetron  (ZOFRAN ) injection 4 mg  4 mg Intravenous Q6H PRN Von Bellis, MD       Oral care mouth rinse  15 mL Mouth Rinse PRN Assaker, Darrin, MD       pantoprazole  (PROTONIX ) EC tablet 40 mg  40 mg Oral Daily Von Bellis, MD       polyethylene glycol (MIRALAX  / GLYCOLAX ) packet 17 g  17 g Per Tube Daily PRN Niels Kayla FALCON, [facility]       sertraline  (ZOLOFT ) tablet 25 mg  25 mg Per Tube Daily Von Bellis, MD   25 mg at 01/16/24 9071   sodium hypochlorite (DAKIN'S 1/4 STRENGTH) topical solution   Irrigation Daily Assaker, Darrin, MD   1 Application at 01/16/24 0930   white petrolatum  (VASELINE) gel   Topical BID Malka Darrin, MD   1 Application at 01/16/24 9073     Review of Systems Unable to do  ROS due to encephalopathy  Physical  Exam Blood pressure 125/65, pulse 92, temperature 98.6 F (37 C),  temperature source Oral, resp. rate 13, height 5' 2 (1.575 m), weight 53.6 kg, SpO2 99%. CONSTITUTIONAL: debilitated, malnourished frail and chronically ill. EYES: Pupils are equal, round, Sclera are non-icteric. EARS, NOSE, MOUTH AND THROAT: The oropharynx is clear. The oral mucosa is pink and moist. Hearing is intact to voice. LYMPH NODES:  Lymph nodes in the neck are normal. RESPIRATORY:  Lungs are clear. There is normal respiratory effort, with equal breath sounds bilaterally, and without pathologic use of accessory muscles. CARDIOVASCULAR: Heart is regular without murmurs, gallops, or rubs. GI: The abdomen is  soft, nontender, and nondistended. There are no palpable masses. There is no hepatosplenomegaly. There are normal bowel sounds in all quadrants. GU: Rectal deferred.   MUSCULOSKELETAL: temporal wasting and malnourished  No cyanosis or edema.   SKIN: large  sacral decubitus stage IV 16x 14 cms  ( picture obtained) with some exudate and small areas of necrosis , bone is exposed, no un drained collections.  NEUROLOGIC: she has some contractures on upper and lower extremities,  She open her eyes but does not follow commands, she does not seem to be suffering.  PSYCH: somnolent, eyes ope  Data Reviewed I have personally reviewed the patient's imaging, laboratory findings and medical records.    Assessment/Plan 68 year old female end-stage renal disease, malnutrition, history of seizure disorder and prior stroke, bedbound and now with sepsis and encephalopathy. I was asked to assess for the sacral decubitus ulcer and without a doubt this is a difficult problem.  It is large and there is evidence of bone exposed so technically osteomyelitis.  Given her disease process I am not certain that this will heal.  The good news is that she does not need surgical intervention.  I am not sure if she will tolerate general anesthetic with a prone position and this will unlikely provide any significant  therapeutic benefit.  I had an extensive discussion with the sister who is the next of kin.  Unfortunately she has continued to deteriorate and her prognosis is guarded.  Will also talk about end-of-life issues and she wishes to further discuss DNR with hospitalist. Recommend improving nutritional status, continuation of wound care with Santyl  and relieving pressure.  Not much I can offer from a surgical perspective I personally spent a total of 75 minutes in the care of the patient today including performing a medically appropriate exam/evaluation, counseling and educating, placing orders, referring and communicating with other health care professionals, documenting clinical information in the EHR, independently interpreting and reviewing images studies and coordinating care.    Laneta Luna, MD FACS General Surgeon 01/16/2024, 11:55 AM

## 2024-01-16 NOTE — Progress Notes (Signed)
 [facility] Progress Note  Patient: Sherry [name]    FMW:[id number]  DOA: 01/15/2024     Date of Service: the patient was seen and examined on 01/16/2024  Chief Complaint  Patient presents with   Altered Mental Status   Brief hospital course: Sherry [name] is a 68 year old female patient with a past medical history of end-stage renal disease on HD MWF via right tunneled cath, seizure disorder, chronic hepatitis C, opioid use disorder, stroke, status post gastrostomy tube, hypertension, diastolic congestive heart failure, depression with anxiety, bipolar, breast cancer status post right mastectomy presenting to [facility] on 07/05 for confusion and altered mental status and hypotension.   She received dialysis earlier today and was reportedly described as being somnolent throughout the dialysis session.  She went back to her rehab and was found to be confused and unresponsive by the team and therefore EMS were called.  On arrival EMS she was found to have systolics in the 80s and therefore she was brought in to [facility] ED.   Of note, she was recently admitted to [facility] on 06/04 to 06/13 with E. coli bacteremia and signs of cholangitis.  She underwent ERCP on 06/06 and was noted to have choledocholithiasis, sphincterotomy was done and a plastic stent was placed.   In the ED, she was found to be hypotensive with MAP low 60s, she received 1 L normal saline bolus with good response to fluids.  She was started on broad-spectrum antibiotics with vancomycin  and aztreonam  and Flagyl .   Labs: Lactate 6.1 WBC 21.6 with 94% neutrophils, hemoglobin 9.1 and hematocrit 29.7, platelets 434. Electrolyte with CO2 21, BUN 33 and creatinine 3.1.  Potassium 3.7.  Chest x-ray without any acute process.  01/15/2024 admitted to the ICU.  Patient was given IV fluid, blood pressure stable, did not require pressor support.  Downgraded under [facility] service on 01/16/2024  Assessment and Plan:  # Sepsis, multifactorial could be due to  C. difficile and E. coli bacteremia, decub osteomyelitis and liver abscess H/o recent instrumentation with ERCP sphincterotomy and stent placement (12/17/2023)  S/p vancomycin  and cefepime  Blood culture growing E. coli, continue ceftriaxone  Stool positive for C. difficile, started vancomycin  Vital signs stable, no need of vasopressors Transferred to progressive unit Follow ID for further recommendation  # C. difficile gastroenteritis Started vancomycin  125 mg p.o. Q6 hourly for 10 days Started probiotics   # Liver abscess GI consulted, recommended MRI versus CT scan with contrast Follow ID consult May need IR guided drain  # Decub osteomyelitis General Surgery consulted, recommended no intervention, less chances of healing Turn patient every 2 hours Follow ID for antibiotics   # ESRD on hemodialysis, MWF schedule Right tunneled catheter need to be removed due to E. coli bacteremia Nephrology consulted vascular surgery Follow nephro for further recommendation  # Seizure disorder Continue Keppra  500 twice daily clobazam  5 mg twice daily Vimpat  20 mg twice daily  # Depression and bipolar disorder Resumed Haldol  and Zoloft  home dose  # HTN, diastolic CHF Continue to monitor BP and titrate medications accordingly Presented with sepsis, we will continue to monitor closely   # History of chronic hep C Follow-up with GI as an outpatient # History of breast cancer, no active issues, follow-up with oncology as an outpatient  # Iron deficiency, Tsat 4% Patient will benefit from IV iron infusion, cannot be given now due to acute infection.  Patient can be evaluated as an outpatient for IV iron transfusion Start oral iron supplement on discharge  with vitamin C    Body mass index is 21.61 kg/m.  Interventions:   Diet: N.p.o. DVT Prophylaxis: Subcutaneous Heparin     Advance goals of care discussion: Full code  Family Communication: family was present at bedside, at the  time of interview.  The pt provided permission to discuss medical plan with the family. Opportunity was given to ask question and all questions were answered satisfactorily.   Disposition:  Pt is from SNF, admitted with sepsis, still on IV antibiotic, which precludes a safe discharge. Discharge to SNF, when stable, may need few days to improve.  Subjective: No significant events overnight, patient was sleepy lethargic and unresponsive.  As per family at bedside she did open eyes while turning and did talk few words but not very talkative.  Patient was dozing off and was resting comfortably Patient is unable to offer any complaints, did not notice any acute distress.  Physical Exam: General: NAD, lying comfortably Appear in no distress Eyes: closed ENT: Oral Mucosa dry   Neck: no JVD,  Cardiovascular: S1 and S2 Present, no Murmur,  Respiratory: good respiratory effort, Bilateral Air entry equal and Decreased, no Crackles, no wheezes Abdomen: Bowel Sound present, Soft and no tenderness,  Extremities: no Pedal edema, no calf tenderness Neurologic: without any new focal findings Gait not checked due to patient safety concerns  Vitals:   01/16/24 0800 01/16/24 0900 01/16/24 1100 01/16/24 1102  BP: 136/73 127/77 125/65   Pulse: 92 93 92   Resp: (!) 24 (!) 21 13   Temp: 98.8 F (37.1 C)   98.6 F (37 C)  TempSrc: Oral   Oral  SpO2: 98% 98% 99%   Weight:      Height:        Intake/Output Summary (Last 24 hours) at 01/16/2024 1300 Last data filed at 01/16/2024 1100 Gross per 24 hour  Intake 1495.36 ml  Output 0 ml  Net 1495.36 ml   Filed Weights   01/15/24 1203 01/15/24 1539  Weight: 54.3 kg 53.6 kg    Data Reviewed: I have personally reviewed and interpreted daily labs, tele strips, imagings as discussed above. I reviewed all nursing notes, pharmacy notes, vitals, pertinent old records I have discussed plan of care as described above with RN and patient/family.  CBC: Recent  Labs  Lab 01/15/24 1300 01/16/24 0419  WBC 21.6* 16.0*  NEUTROABS 20.3*  --   HGB 9.1* 9.2*  HCT 29.7* 29.2*  MCV 93.4 89.8  PLT 434* 398   Basic Metabolic Panel: Recent Labs  Lab 01/15/24 1300 01/16/24 0419  NA 134* 135  K 3.7 3.7  CL 95* 97*  CO2 21* 20*  GLUCOSE 148* 104*  BUN 33* 45*  CREATININE 3.10* 3.14*  CALCIUM  8.3* 8.2*  MG  --  2.1  PHOS  --  4.7*    Studies: CT ABDOMEN PELVIS WO CONTRAST Result Date: 01/15/2024 CLINICAL DATA:  Abdomen pain unresponsive EXAM: CT ABDOMEN AND PELVIS WITHOUT CONTRAST TECHNIQUE: Multidetector CT imaging of the abdomen and pelvis was performed following the standard protocol without IV contrast. RADIATION DOSE REDUCTION: This exam was performed according to the departmental dose-optimization program which includes automated exposure control, adjustment of the mA and/or kV according to patient size and/or use of iterative reconstruction technique. COMPARISON:  MRI 12/16/2023, CT 12/15/2023, 04/06/2023 FINDINGS: Lower chest: Lung bases demonstrate scarring or atelectasis at the lung bases. Partially visualized catheter in the right atrium. Trace pericardial effusion. Mitral calcification. Hepatobiliary: Interim placement of biliary stent, proximal  part of the stent is in the common bile duct, distal portion of the stent is in the third portion of duodenum. There is residual intra hepatic biliary dilatation. Focal ill-defined hypodense areas within the periphery of the liver, most evident in the right hepatic lobe, measuring up to 2.3 cm by 4.9 cm inferiorly on the right, series 2, image 33. Common bile duct diameter 2.2 cm, air in the common bile duct. Pancreas: Unremarkable. No pancreatic ductal dilatation or surrounding inflammatory changes. Spleen: Normal in size without focal abnormality. Adrenals/Urinary Tract: Right adrenal gland is normal. 2 cm left adrenal nodule, stable and presumably representing adenoma. Atrophic kidneys without  hydronephrosis. Renal cysts for which no imaging follow-up is recommended. Small hyperdense subcentimeter lesions at the mid left kidney too small to further characterize, no specific imaging follow-up is recommended. Thick walled appearance of the urinary bladder Stomach/Bowel: Gastrostomy tube with tip in the body of the stomach. Intra-abdominal portion of tubing appears to course along the anterior wall of the splenic flexure with some haziness around tubing but no frank perforation of the colon. There is no dilated small bowel. Colon is dilated and contains both fluid and large stool mixed with high density material. There may be some colon wall thickening at the rectosigmoid colon. Vascular/Lymphatic: Aortic atherosclerosis. No enlarged abdominal or pelvic lymph nodes. Reproductive: Hysterectomy.  No adnexal mass Other: No free air. No significant ascites. Presacral edema, small volume fluid, and stranding. Musculoskeletal: Posterior spinal fusion hardware with interbody device at L4-L5. Chronic irregular degenerative change at L5-S1. Sacral coccygeal decubitus ulcer extends down to the bone. Mild erosive change at the sacrococcygeal region concerning for osteomyelitis. Coccygeal segments are displaced anteriorly into the pelvis, posterior to the rectum with surrounding fluid, sagittal series 6, image 65. Generalized subcutaneous edema. Evidence of prior ventral hernia repair. Chronic advanced degenerative change of the right hip with suspected AVN and mild fragmentation of femoral head. IMPRESSION: 1. Interim placement of biliary stent with residual intra and extrahepatic biliary dilatation. Ill-defined hypodense areas within the periphery of the liver, most evident in the right hepatic lobe, measuring up to 4.9 cm inferiorly on the right. Findings appear progressive compared with the CT from 12/15/2023, and as suggested on the interval MRI, could represent progressively dilated biliary radicles versus intra  hepatic abscesses, underlying neoplastic process not excluded. 2. Sacral coccygeal decubitus ulcer extends down to the bone. Mild erosive change at the sacrococcygeal region concerning for osteomyelitis. Coccygeal bone segments/fragments are displaced anteriorly into the pelvis, posterior to the rectum with surrounding fluid. 3. Gastrostomy tube with tip in the body of the stomach. Intra-abdominal portion of tubing appears to course along the anterior wall of the splenic flexure with some haziness around tubing but no frank perforation of the colon. 4. Dilated colon containing both fluid and large stool mixed with high density material. Large fecal impaction at the rectum. There may be some colon wall thickening at the rectosigmoid colon, question colitis. 5. Decompressed thick walled appearance of the urinary bladder, question cystitis. 6. Atrophic kidneys. 7. 2 cm left adrenal nodule, stable and presumably representing adenoma. 8. Aortic atherosclerosis. Aortic Atherosclerosis (ICD10-I70.0). Electronically Signed   By: Luke Bun M.D.   On: 01/15/2024 15:30   CT Head Wo Contrast Result Date: 01/15/2024 CLINICAL DATA:  Delirium hypotensive unresponsive EXAM: CT HEAD WITHOUT CONTRAST TECHNIQUE: Contiguous axial images were obtained from the base of the skull through the vertex without intravenous contrast. RADIATION DOSE REDUCTION: This exam was performed according to  the departmental dose-optimization program which includes automated exposure control, adjustment of the mA and/or kV according to patient size and/or use of iterative reconstruction technique. COMPARISON:  CT brain 12/15/2023, MRI 09/20/2023 FINDINGS: Brain: No acute territorial infarction, hemorrhage or intracranial mass. Atrophy and moderate advanced chronic small vessel ischemic changes of the white matter. Stable ventricle size. Small chronic infarcts in the left basal ganglia and cerebellum. Vascular: No hyperdense vessels.  Carotid vascular  calcification Skull: Partially excluded right frontal bone.  No obvious fracture Sinuses/Orbits: Partially excluded right orbit. No obvious acute abnormality Other: None IMPRESSION: 1. No CT evidence for acute intracranial abnormality. 2. Atrophy and chronic small vessel ischemic changes of the white matter. Small chronic infarcts in the left basal ganglia and cerebellum. Electronically Signed   By: Luke Bun M.D.   On: 01/15/2024 15:05    Scheduled Meds:  ascorbic acid   500 mg Per Tube BID   Chlorhexidine  Gluconate Cloth  6 each Topical Daily   cloBAZam   5 mg Per Tube Q12H   fentaNYL  (SUBLIMAZE ) injection  25 mcg Intravenous Once   [START ON 01/18/2024] haloperidol   1 mg Per Tube Once per day on Tuesday Saturday   heparin  injection (subcutaneous)  5,000 Units Subcutaneous Q8H   lacosamide   200 mg Per Tube BID   levETIRAcetam   500 mg Per Tube BID   liver oil-zinc  oxide   Topical BID   pantoprazole   40 mg Oral Daily   sertraline   25 mg Per Tube Daily   sodium hypochlorite   Irrigation Daily   white petrolatum    Topical BID   Continuous Infusions:  cefTRIAXone  (ROCEPHIN )  IV Stopped (01/16/24 0313)   PRN Meds: acetaminophen , docusate sodium , HYDROmorphone  (DILAUDID ) injection, HYDROmorphone , hydrOXYzine , LORazepam , ondansetron  (ZOFRAN ) IV, mouth rinse, polyethylene glycol  Time spent: 55 minutes  Author: ELVAN SOR. MD [facility] 01/16/2024 1:00 PM  To reach On-call, see care teams to locate the attending and reach out to them via [email or web address]. If 7PM-7AM, please contact night-coverage If you still have difficulty reaching the attending provider, please page the [facility] (Director on Call) for [facility] on amion for assistance.

## 2024-01-16 NOTE — Care Management Obs Status (Signed)
 MEDICARE OBSERVATION STATUS NOTIFICATION   Patient Details  Name: Sherry [name] MRN: [id number] Date of Birth: [birth year]   Medicare Observation Status Notification Given:  Yes    [place] L Lizza Huffaker, LCSW 01/16/2024, 4:14 PM

## 2024-01-17 ENCOUNTER — Inpatient Hospital Stay

## 2024-01-17 DIAGNOSIS — N179 Acute kidney failure, unspecified: Secondary | ICD-10-CM | POA: Diagnosis not present

## 2024-01-17 DIAGNOSIS — A414 Sepsis due to anaerobes: Secondary | ICD-10-CM

## 2024-01-17 DIAGNOSIS — R748 Abnormal levels of other serum enzymes: Secondary | ICD-10-CM | POA: Diagnosis not present

## 2024-01-17 DIAGNOSIS — Z711 Person with feared health complaint in whom no diagnosis is made: Secondary | ICD-10-CM

## 2024-01-17 DIAGNOSIS — K769 Liver disease, unspecified: Secondary | ICD-10-CM

## 2024-01-17 DIAGNOSIS — Z789 Other specified health status: Secondary | ICD-10-CM | POA: Diagnosis not present

## 2024-01-17 DIAGNOSIS — R4182 Altered mental status, unspecified: Secondary | ICD-10-CM | POA: Diagnosis not present

## 2024-01-17 DIAGNOSIS — B9620 Unspecified Escherichia coli [E. coli] as the cause of diseases classified elsewhere: Secondary | ICD-10-CM

## 2024-01-17 DIAGNOSIS — K750 Abscess of liver: Secondary | ICD-10-CM

## 2024-01-17 DIAGNOSIS — E43 Unspecified severe protein-calorie malnutrition: Secondary | ICD-10-CM | POA: Diagnosis present

## 2024-01-17 DIAGNOSIS — K831 Obstruction of bile duct: Secondary | ICD-10-CM | POA: Diagnosis not present

## 2024-01-17 DIAGNOSIS — A4151 Sepsis due to Escherichia coli [E. coli]: Secondary | ICD-10-CM

## 2024-01-17 DIAGNOSIS — Z515 Encounter for palliative care: Secondary | ICD-10-CM | POA: Diagnosis not present

## 2024-01-17 DIAGNOSIS — R7881 Bacteremia: Secondary | ICD-10-CM

## 2024-01-17 DIAGNOSIS — A0472 Enterocolitis due to Clostridium difficile, not specified as recurrent: Secondary | ICD-10-CM

## 2024-01-17 DIAGNOSIS — R6520 Severe sepsis without septic shock: Secondary | ICD-10-CM | POA: Diagnosis not present

## 2024-01-17 DIAGNOSIS — A419 Sepsis, unspecified organism: Secondary | ICD-10-CM | POA: Diagnosis not present

## 2024-01-17 DIAGNOSIS — Z7189 Other specified counseling: Secondary | ICD-10-CM | POA: Diagnosis not present

## 2024-01-17 DIAGNOSIS — L89159 Pressure ulcer of sacral region, unspecified stage: Secondary | ICD-10-CM

## 2024-01-17 LAB — BASIC METABOLIC PANEL WITH GFR
Anion gap: 17 — ABNORMAL HIGH (ref 5–15)
BUN: 63 mg/dL — ABNORMAL HIGH (ref 8–23)
CO2: 21 mmol/L — ABNORMAL LOW (ref 22–32)
Calcium: 8.5 mg/dL — ABNORMAL LOW (ref 8.9–10.3)
Chloride: 98 mmol/L (ref 98–111)
Creatinine, Ser: 3.78 mg/dL — ABNORMAL HIGH (ref 0.44–1.00)
GFR, Estimated: 12 mL/min — ABNORMAL LOW (ref >60)
Glucose, Bld: 80 mg/dL (ref 70–99)
Potassium: 3.8 mmol/L (ref 3.5–5.1)
Sodium: 136 mmol/L (ref 135–145)

## 2024-01-17 LAB — HEPATIC FUNCTION PANEL
ALT: 23 U/L (ref 0–44)
AST: 67 U/L — ABNORMAL HIGH (ref 15–41)
Albumin: 2 g/dL — ABNORMAL LOW (ref 3.5–5.0)
Alkaline Phosphatase: 532 U/L — ABNORMAL HIGH (ref 38–126)
Bilirubin, Direct: 0.1 mg/dL (ref 0.0–0.2)
Indirect Bilirubin: 0.8 mg/dL (ref 0.3–0.9)
Total Bilirubin: 0.9 mg/dL (ref 0.0–1.2)
Total Protein: 5.8 g/dL — ABNORMAL LOW (ref 6.5–8.1)

## 2024-01-17 LAB — C-REACTIVE PROTEIN: CRP: 32.6 mg/dL — ABNORMAL HIGH (ref <1.0)

## 2024-01-17 LAB — GLUCOSE, CAPILLARY
Glucose-Capillary: 108 mg/dL — ABNORMAL HIGH (ref 70–99)
Glucose-Capillary: 112 mg/dL — ABNORMAL HIGH (ref 70–99)

## 2024-01-17 LAB — PHOSPHORUS: Phosphorus: 5.3 mg/dL — ABNORMAL HIGH (ref 2.5–4.6)

## 2024-01-17 LAB — CBC
HCT: 23.8 % — ABNORMAL LOW (ref 36.0–46.0)
Hemoglobin: 7.4 g/dL — ABNORMAL LOW (ref 12.0–15.0)
MCH: 28.1 pg (ref 26.0–34.0)
MCHC: 31.1 g/dL (ref 30.0–36.0)
MCV: 90.5 fL (ref 80.0–100.0)
Platelets: 371 K/uL (ref 150–400)
RBC: 2.63 MIL/uL — ABNORMAL LOW (ref 3.87–5.11)
RDW: 16.5 % — ABNORMAL HIGH (ref 11.5–15.5)
WBC: 20.8 K/uL — ABNORMAL HIGH (ref 4.0–10.5)
nRBC: 0 % (ref 0.0–0.2)

## 2024-01-17 LAB — MAGNESIUM: Magnesium: 2.3 mg/dL (ref 1.7–2.4)

## 2024-01-17 LAB — LACTIC ACID, PLASMA: Lactic Acid, Venous: 0.8 mmol/L (ref 0.5–1.9)

## 2024-01-17 LAB — SEDIMENTATION RATE: Sed Rate: 105 mm/h — ABNORMAL HIGH (ref 0–30)

## 2024-01-17 MED ORDER — RENA-VITE PO TABS
1.0000 | ORAL_TABLET | Freq: Every day | ORAL | Status: DC
  Administered 2024-01-17 – 2024-01-19 (×3): 1
  Filled 2024-01-17 (×3): qty 1

## 2024-01-17 MED ORDER — ZINC SULFATE 220 (50 ZN) MG PO CAPS
220.0000 mg | ORAL_CAPSULE | Freq: Every day | ORAL | Status: DC
  Administered 2024-01-17 – 2024-01-20 (×4): 220 mg
  Filled 2024-01-17 (×4): qty 1

## 2024-01-17 MED ORDER — VITAL HP 1.0 CAL PO LIQD: 1000.0000 mL | ORAL | Status: DC

## 2024-01-17 MED ORDER — NEPRO/CARBSTEADY PO LIQD
237.0000 mL | ORAL | Status: DC
  Administered 2024-01-17 – 2024-01-19 (×5): 237 mL

## 2024-01-17 MED ORDER — HYDRALAZINE HCL 50 MG PO TABS: 50.0000 mg | ORAL_TABLET | Freq: Four times a day (QID) | ORAL | Status: DC | PRN

## 2024-01-17 MED ORDER — CARVEDILOL 6.25 MG PO TABS
6.2500 mg | ORAL_TABLET | Freq: Two times a day (BID) | ORAL | Status: DC
  Administered 2024-01-17 – 2024-01-18 (×3): 6.25 mg
  Filled 2024-01-17 (×3): qty 1

## 2024-01-17 MED ORDER — LACOSAMIDE 200 MG PO TABS
200.0000 mg | ORAL_TABLET | Freq: Two times a day (BID) | ORAL | Status: DC
  Administered 2024-01-17 (×2): 200 mg
  Filled 2024-01-17 (×4): qty 1

## 2024-01-17 MED ORDER — HALOPERIDOL 1 MG PO TABS: 1.0000 mg | ORAL_TABLET | ORAL | Status: DC

## 2024-01-17 MED ORDER — JUVEN PO PACK
1.0000 | PACK | Freq: Two times a day (BID) | ORAL | Status: DC
  Administered 2024-01-18 – 2024-01-20 (×4): 1

## 2024-01-17 MED ORDER — DOCUSATE SODIUM 50 MG/5ML PO LIQD: 100.0000 mg | Freq: Two times a day (BID) | ORAL | Status: DC | PRN

## 2024-01-17 MED ORDER — PROSOURCE TF20 ENFIT COMPATIBL EN LIQD
60.0000 mL | Freq: Every day | ENTERAL | Status: DC
  Administered 2024-01-17 – 2024-01-20 (×3): 60 mL
  Filled 2024-01-17 (×3): qty 60

## 2024-01-17 MED ORDER — THIAMINE MONONITRATE 100 MG PO TABS
100.0000 mg | ORAL_TABLET | Freq: Every day | ORAL | Status: DC
  Administered 2024-01-17 – 2024-01-20 (×3): 100 mg
  Filled 2024-01-17 (×4): qty 1

## 2024-01-17 NOTE — Progress Notes (Signed)
 Palliative Care Progress Note, Assessment & Plan   Patient Name: Sherry [name]       Date: 01/17/2024 DOB: 1955-12-07  Age: 68 y.o. MRN#: [id number] Attending Physician: Von Bellis, MD Primary Care Physician: Caleen Dirks, MD Admit Date: 01/15/2024  Subjective: Unable to assess  HPI: 68 y.o. female  with past medical history significant for ESRD on HD, seizure disorder, chroninc hepatitis C, opioid use disorder, CVA, s/p gastrostomy tube, HTN, dCHF, depression with anxiety, bipolar, breast cancer s/p R mastectomy. She presented to [facility] ED from [facility] 01/15/2024 with c/o AMS and hypotension. It was reported that patient had received dialysis PTA and was somnolent throughout dialysis session.    ED workup significant for lactic 6.1, WBC 21.6, Hgb 9.1, Hct 29.7, plts 434, creatinine 3.1, BUN 33 and K+ 3.7. CXR negative.  BP 81/71, HR 100, RR 16, SpO2 100% RA, 98.6.    Of note, patient was previously admitted to [facility] 6/4-6/13/25 with E.coli bacteremia and signs of cholangitis. She underwent ERCP 6/6 that found choledocholithiasis. Sphincterotomy was performed and plastic stent placed.    She was admitted for management of sepsis, sacral wound with signs of sacral osteomyelitis.    Palliative team consulted for assistance with goals of care conversations.     Summary of counseling/coordination of care: Extensive chart review completed prior to meeting patient including labs, vital signs, imaging, progress notes, orders, and available advanced directive documents from current and previous encounters.   After reviewing the patient's chart and assessing the patient at bedside, I spoke with patient's sister at the bedside in regards to symptom management and goals of care.   Ill-appearing female  resting in bed.  She is not responsive to verbal or tactile stimuli.  Even, unlabored respirations.  She is in no distress. [name], sister-H POA at the bedside.  [name] shares that patient has been unresponsive without eating or drinking for several days.  She is concerned that her labs continue to worsen possibly indicating patient is not responding to treatment.  [name] is tearful sharing her husband died approximately 1 year ago with kidney failure as well.  She states this is very difficult for her but reports significant decline in patient's health since January.  Shared recommendation that CPR would possibly cause more harm if patient suffered a cardiac event.  She agrees that her quality of life is poor but is hesitant to make decisions such as CODE STATUS without speaking to family.  Mercer shares that her sisters wish was for them not to give up on her.  Mercer wants to allow time to see if her sister will respond to treatment.  Briefly discussed poor prognosis for her sister and possibility of continued conversations to reduce suffering and poor quality of life.  Therapeutic silence and active listening provided for patient's sister to share her thoughts and emotions regarding current medical situation.  Emotional support provided.  Physical Exam Vitals reviewed.  Constitutional:      General: She is not in acute distress.    Appearance: She is ill-appearing. She is not toxic-appearing.  HENT:     Head: Normocephalic and atraumatic.  Pulmonary:     Effort: Pulmonary effort is normal.  No respiratory distress.  Musculoskeletal:     Right lower leg: No edema.     Left lower leg: No edema.  Skin:    General: Skin is warm and dry.  Neurological:     Comments: Unresponsive to verbal or tactile stimuli     Recommendations/Plan: FULL CODE status as previously documented    Continue current supportive interventions Allow time for outcomes  Palliative will follow to continue GOC  conversations        Total Time 50 minutes   Time spent includes: Detailed review of medical records (labs, imaging, vital signs), medically appropriate exam (mental status, respiratory, cardiac, skin), discussed with treatment team, counseling and educating patient, family and staff, documenting clinical information, medication management and coordination of care.     Devere Sacks, ELNITA- [facility] Palliative Medicine Team  01/17/2024 9:12 AM  Office [phone]  Pager [phone]

## 2024-01-17 NOTE — Progress Notes (Signed)
 We have tried to get the patient down this afternoon for PermCath removal.  Had endoscopy this afternoon and still not back in her room.  Will plan on PermCath removal tomorrow morning.

## 2024-01-17 NOTE — Progress Notes (Signed)
 Rogelia Copping, MD [facility]   [address] [place], [place] [address] Phone: [phone] Fax : [phone]   Subjective: The patient has been sleeping continuously and is arousable with shaking.  She then falls back to sleep.  She has a normal bilirubin and her alkaline phosphatase is elevated due to the patient having a stent placed due to cholangitis last month with a stone extraction.  The patient also has lesions on her liver seen on the CT scan that showed:  IMPRESSION: 1. Interim placement of biliary stent with residual intra and extrahepatic biliary dilatation. Ill-defined hypodense areas within the periphery of the liver, most evident in the right hepatic lobe, measuring up to 4.9 cm inferiorly on the right. Findings appear progressive compared with the CT from 12/15/2023, and as suggested on the interval MRI, could represent progressively dilated biliary radicles versus intra hepatic abscesses, underlying neoplastic process not excluded. 2. Sacral coccygeal decubitus ulcer extends down to the bone. Mild erosive change at the sacrococcygeal region concerning for osteomyelitis. Coccygeal bone segments/fragments are displaced anteriorly into the pelvis, posterior to the rectum with surrounding fluid. 3. Gastrostomy tube with tip in the body of the stomach. Intra-abdominal portion of tubing appears to course along the anterior wall of the splenic flexure with some haziness around tubing but no frank perforation of the colon. 4. Dilated colon containing both fluid and large stool mixed with high density material. Large fecal impaction at the rectum. There may be some colon wall thickening at the rectosigmoid colon, question colitis. 5. Decompressed thick walled appearance of the urinary bladder, question cystitis. 6. Atrophic kidneys. 7. 2 cm left adrenal nodule, stable and presumably representing adenoma. 8. Aortic atherosclerosis.  It was recommended that the  patient undergo an MRI.  The patient was also found to have C. difficile positive stools.    Objective: Vital signs in last 24 hours: Vitals:   01/17/24 0410 01/17/24 0500 01/17/24 0850 01/17/24 1304  BP: (!) 167/84  137/70 (!) 151/77  Pulse: 97  93 98  Resp: 20     Temp: 98.1 F (36.7 C)  98.4 F (36.9 C) 99.8 F (37.7 C)  TempSrc:      SpO2: 100%  100% 100%  Weight:  58.2 kg    Height:       Weight change: 3.9 kg  Intake/Output Summary (Last 24 hours) at 01/17/2024 1342 Last data filed at 01/17/2024 0615 Gross per 24 hour  Intake 100 ml  Output 0 ml  Net 100 ml     Exam: General: Patient with lethargy in no apparent distress   Lab Results: @LABTEST2 @ Micro Results: Recent Results (from the past 240 hours)  Blood Culture (routine x 2)     Status: None (Preliminary result)   Collection Time: 01/15/24 12:42 PM   Specimen: BLOOD  Result Value Ref Range Status   Specimen Description   Final    BLOOD RIGHT ANTECUBITAL Performed at [facility], [address] [place], [place] [address]    Special Requests   Final    BOTTLES DRAWN AEROBIC AND ANAEROBIC Blood Culture results may not be optimal due to an inadequate volume of blood received in culture bottles Performed at [facility], [address], [place], [place] [address]    Culture  Setup Time   Final    GRAM NEGATIVE RODS AEROBIC BOTTLE ONLY CRITICAL VALUE NOTED.  VALUE IS CONSISTENT WITH PREVIOUSLY REPORTED AND CALLED VALUE. Performed at [facility],  [address], [place], [place] [address]    Culture   Final    VONNE NEGATIVE RODS IDENTIFICATION TO FOLLOW Performed at [facility], [address] [place], [place] [address]    Report Status PENDING  Incomplete  Blood Culture (routine x 2)     Status: Abnormal (Preliminary result)   Collection Time: 01/15/24 12:42 PM   Specimen: BLOOD  Result Value Ref Range Status   Specimen Description   Final    BLOOD LEFT  ANTECUBITAL Performed at [facility], [address] [place], [place] [address]    Special Requests   Final    BOTTLES DRAWN AEROBIC AND ANAEROBIC Blood Culture results may not be optimal due to an inadequate volume of blood received in culture bottles Performed at [facility], [address], [place], [place] [address]    Culture  Setup Time   Final    GRAM NEGATIVE RODS AEROBIC BOTTLE ONLY CRITICAL RESULT CALLED TO, READ BACK BY AND VERIFIED WITH: JASON ROBBINS, PHARMD @0132  01/16/2024 COP    Culture (A)  Final    ESCHERICHIA COLI SUSCEPTIBILITIES TO FOLLOW Performed at [facility], [address], [place], [place] [address]    Report Status PENDING  Incomplete  Blood Culture ID Panel (Reflexed)     Status: Abnormal   Collection Time: 01/15/24 12:42 PM  Result Value Ref Range Status   Enterococcus faecalis NOT DETECTED NOT DETECTED Final   Enterococcus Faecium NOT DETECTED NOT DETECTED Final   Listeria monocytogenes NOT DETECTED NOT DETECTED Final   Staphylococcus species NOT DETECTED NOT DETECTED Final   Staphylococcus aureus (BCID) NOT DETECTED NOT DETECTED Final   Staphylococcus epidermidis NOT DETECTED NOT DETECTED Final   Staphylococcus lugdunensis NOT DETECTED NOT DETECTED Final   Streptococcus species NOT DETECTED NOT DETECTED Final   Streptococcus agalactiae NOT DETECTED NOT DETECTED Final   Streptococcus pneumoniae NOT DETECTED NOT DETECTED Final   Streptococcus pyogenes NOT DETECTED NOT DETECTED Final   A.calcoaceticus-baumannii NOT DETECTED NOT DETECTED Final   Bacteroides fragilis NOT DETECTED NOT DETECTED Final   Enterobacterales DETECTED (A) NOT DETECTED Final    Comment: Enterobacterales represent a large order of gram negative bacteria, not a single organism. CRITICAL RESULT CALLED TO, READ BACK BY AND VERIFIED WITH: JASON ROBBINS, PHARMD @0132  01/16/2024 COP    Enterobacter cloacae complex NOT DETECTED NOT DETECTED Final    Escherichia coli DETECTED (A) NOT DETECTED Final    Comment: CRITICAL RESULT CALLED TO, READ BACK BY AND VERIFIED WITH: JASON ROBBINS, PHARMD @0132  01/16/2024 COP    Klebsiella aerogenes NOT DETECTED NOT DETECTED Final   Klebsiella oxytoca NOT DETECTED NOT DETECTED Final   Klebsiella pneumoniae NOT DETECTED NOT DETECTED Final   Proteus species NOT DETECTED NOT DETECTED Final   Salmonella species NOT DETECTED NOT DETECTED Final   Serratia marcescens NOT DETECTED NOT DETECTED Final   Haemophilus influenzae NOT DETECTED NOT DETECTED Final   Neisseria meningitidis NOT DETECTED NOT DETECTED Final   Pseudomonas aeruginosa NOT DETECTED NOT DETECTED Final   Stenotrophomonas maltophilia NOT DETECTED NOT DETECTED Final   Candida albicans NOT DETECTED NOT DETECTED Final   Candida auris NOT DETECTED NOT DETECTED Final   Candida glabrata NOT DETECTED NOT DETECTED Final   Candida krusei NOT DETECTED NOT DETECTED Final   Candida parapsilosis NOT DETECTED NOT DETECTED Final   Candida tropicalis NOT DETECTED NOT DETECTED Final   Cryptococcus neoformans/gattii NOT DETECTED NOT DETECTED Final   CTX-M ESBL  NOT DETECTED NOT DETECTED Final   Carbapenem resistance IMP NOT DETECTED NOT DETECTED Final   Carbapenem resistance KPC NOT DETECTED NOT DETECTED Final   Carbapenem resistance NDM NOT DETECTED NOT DETECTED Final   Carbapenem resist OXA 48 LIKE NOT DETECTED NOT DETECTED Final   Carbapenem resistance VIM NOT DETECTED NOT DETECTED Final    Comment: Performed at [facility], [address] [place], [place] [address]  MRSA Next Gen by PCR, Nasal     Status: None   Collection Time: 01/15/24  3:40 PM   Specimen: Nasal Mucosa; Nasal Swab  Result Value Ref Range Status   MRSA by PCR Next Gen NOT DETECTED NOT DETECTED Final    Comment: (NOTE) The GeneXpert MRSA Assay (FDA approved for NASAL specimens only), is one component of a comprehensive MRSA colonization surveillance program. It is not  intended to diagnose MRSA infection nor to guide or monitor treatment for MRSA infections. Test performance is not FDA approved in patients less than 9 years old. Performed at [facility], [address] [place], [place] [address]   C Difficile Quick Screen w PCR reflex     Status: Abnormal   Collection Time: 01/16/24 10:32 AM   Specimen: Stool  Result Value Ref Range Status   C Diff antigen POSITIVE (A) NEGATIVE Final   C Diff toxin NEGATIVE NEGATIVE Final   C Diff interpretation Results are indeterminate. See PCR results.  Final    Comment: Performed at [facility], [address] [place], [place] [address]  Gastrointestinal Panel by PCR , Stool     Status: None   Collection Time: 01/16/24 10:32 AM   Specimen: Stool  Result Value Ref Range Status   Campylobacter species NOT DETECTED NOT DETECTED Final   Plesimonas shigelloides NOT DETECTED NOT DETECTED Final   Salmonella species NOT DETECTED NOT DETECTED Final   Yersinia enterocolitica NOT DETECTED NOT DETECTED Final   Vibrio species NOT DETECTED NOT DETECTED Final   Vibrio cholerae NOT DETECTED NOT DETECTED Final   Enteroaggregative E coli (EAEC) NOT DETECTED NOT DETECTED Final   Enteropathogenic E coli (EPEC) NOT DETECTED NOT DETECTED Final   Enterotoxigenic E coli (ETEC) NOT DETECTED NOT DETECTED Final   Shiga like toxin producing E coli (STEC) NOT DETECTED NOT DETECTED Final   Shigella/Enteroinvasive E coli (EIEC) NOT DETECTED NOT DETECTED Final   Cryptosporidium NOT DETECTED NOT DETECTED Final   Cyclospora cayetanensis NOT DETECTED NOT DETECTED Final   Entamoeba histolytica NOT DETECTED NOT DETECTED Final   Giardia lamblia NOT DETECTED NOT DETECTED Final   Adenovirus F40/41 NOT DETECTED NOT DETECTED Final   Astrovirus NOT DETECTED NOT DETECTED Final   Norovirus GI/GII NOT DETECTED NOT DETECTED Final   Rotavirus A NOT DETECTED NOT DETECTED Final   Sapovirus (I, II, IV, and V) NOT DETECTED NOT  DETECTED Final    Comment: Performed at [facility], [address] [place], [place] [address]  C. Diff by PCR, Reflexed     Status: Abnormal   Collection Time: 01/16/24 10:32 AM  Result Value Ref Range Status   Toxigenic C. Difficile by PCR POSITIVE (A) NEGATIVE Final    Comment: Positive for toxigenic C. difficile with little to no toxin production. Only treat if clinical presentation suggests symptomatic illness. Performed at [facility], [address] [place], [place] [address]    Studies/Results: CT ABDOMEN PELVIS WO CONTRAST Result Date: 01/15/2024 CLINICAL DATA:  Abdomen pain unresponsive EXAM: CT ABDOMEN AND  PELVIS WITHOUT CONTRAST TECHNIQUE: Multidetector CT imaging of the abdomen and pelvis was performed following the standard protocol without IV contrast. RADIATION DOSE REDUCTION: This exam was performed according to the departmental dose-optimization program which includes automated exposure control, adjustment of the mA and/or kV according to patient size and/or use of iterative reconstruction technique. COMPARISON:  MRI 12/16/2023, CT 12/15/2023, 04/06/2023 FINDINGS: Lower chest: Lung bases demonstrate scarring or atelectasis at the lung bases. Partially visualized catheter in the right atrium. Trace pericardial effusion. Mitral calcification. Hepatobiliary: Interim placement of biliary stent, proximal part of the stent is in the common bile duct, distal portion of the stent is in the third portion of duodenum. There is residual intra hepatic biliary dilatation. Focal ill-defined hypodense areas within the periphery of the liver, most evident in the right hepatic lobe, measuring up to 2.3 cm by 4.9 cm inferiorly on the right, series 2, image 33. Common bile duct diameter 2.2 cm, air in the common bile duct. Pancreas: Unremarkable. No pancreatic ductal dilatation or surrounding inflammatory changes. Spleen: Normal in size without focal abnormality. Adrenals/Urinary  Tract: Right adrenal gland is normal. 2 cm left adrenal nodule, stable and presumably representing adenoma. Atrophic kidneys without hydronephrosis. Renal cysts for which no imaging follow-up is recommended. Small hyperdense subcentimeter lesions at the mid left kidney too small to further characterize, no specific imaging follow-up is recommended. Thick walled appearance of the urinary bladder Stomach/Bowel: Gastrostomy tube with tip in the body of the stomach. Intra-abdominal portion of tubing appears to course along the anterior wall of the splenic flexure with some haziness around tubing but no frank perforation of the colon. There is no dilated small bowel. Colon is dilated and contains both fluid and large stool mixed with high density material. There may be some colon wall thickening at the rectosigmoid colon. Vascular/Lymphatic: Aortic atherosclerosis. No enlarged abdominal or pelvic lymph nodes. Reproductive: Hysterectomy.  No adnexal mass Other: No free air. No significant ascites. Presacral edema, small volume fluid, and stranding. Musculoskeletal: Posterior spinal fusion hardware with interbody device at L4-L5. Chronic irregular degenerative change at L5-S1. Sacral coccygeal decubitus ulcer extends down to the bone. Mild erosive change at the sacrococcygeal region concerning for osteomyelitis. Coccygeal segments are displaced anteriorly into the pelvis, posterior to the rectum with surrounding fluid, sagittal series 6, image 65. Generalized subcutaneous edema. Evidence of prior ventral hernia repair. Chronic advanced degenerative change of the right hip with suspected AVN and mild fragmentation of femoral head. IMPRESSION: 1. Interim placement of biliary stent with residual intra and extrahepatic biliary dilatation. Ill-defined hypodense areas within the periphery of the liver, most evident in the right hepatic lobe, measuring up to 4.9 cm inferiorly on the right. Findings appear progressive compared  with the CT from 12/15/2023, and as suggested on the interval MRI, could represent progressively dilated biliary radicles versus intra hepatic abscesses, underlying neoplastic process not excluded. 2. Sacral coccygeal decubitus ulcer extends down to the bone. Mild erosive change at the sacrococcygeal region concerning for osteomyelitis. Coccygeal bone segments/fragments are displaced anteriorly into the pelvis, posterior to the rectum with surrounding fluid. 3. Gastrostomy tube with tip in the body of the stomach. Intra-abdominal portion of tubing appears to course along the anterior wall of the splenic flexure with some haziness around tubing but no frank perforation of the colon. 4. Dilated colon containing both fluid and large stool mixed with high density material. Large fecal impaction at the rectum. There may be some colon wall thickening at the rectosigmoid colon,  question colitis. 5. Decompressed thick walled appearance of the urinary bladder, question cystitis. 6. Atrophic kidneys. 7. 2 cm left adrenal nodule, stable and presumably representing adenoma. 8. Aortic atherosclerosis. Aortic Atherosclerosis (ICD10-I70.0). Electronically Signed   By: Luke Bun M.D.   On: 01/15/2024 15:30   CT Head Wo Contrast Result Date: 01/15/2024 CLINICAL DATA:  Delirium hypotensive unresponsive EXAM: CT HEAD WITHOUT CONTRAST TECHNIQUE: Contiguous axial images were obtained from the base of the skull through the vertex without intravenous contrast. RADIATION DOSE REDUCTION: This exam was performed according to the departmental dose-optimization program which includes automated exposure control, adjustment of the mA and/or kV according to patient size and/or use of iterative reconstruction technique. COMPARISON:  CT brain 12/15/2023, MRI 09/20/2023 FINDINGS: Brain: No acute territorial infarction, hemorrhage or intracranial mass. Atrophy and moderate advanced chronic small vessel ischemic changes of the white matter.  Stable ventricle size. Small chronic infarcts in the left basal ganglia and cerebellum. Vascular: No hyperdense vessels.  Carotid vascular calcification Skull: Partially excluded right frontal bone.  No obvious fracture Sinuses/Orbits: Partially excluded right orbit. No obvious acute abnormality Other: None IMPRESSION: 1. No CT evidence for acute intracranial abnormality. 2. Atrophy and chronic small vessel ischemic changes of the white matter. Small chronic infarcts in the left basal ganglia and cerebellum. Electronically Signed   By: Luke Bun M.D.   On: 01/15/2024 15:05   Medications: I have reviewed the patient's current medications. Scheduled Meds:  ascorbic acid   500 mg Per Tube BID   carvedilol   6.25 mg Per Tube BID   Chlorhexidine  Gluconate Cloth  6 each Topical Daily   cloBAZam   5 mg Per Tube Q12H   [START ON 01/18/2024] collagenase    Topical Daily   fentaNYL  (SUBLIMAZE ) injection  25 mcg Intravenous Once   [START ON 01/18/2024] haloperidol   1 mg Per Tube Once per day on Tuesday Saturday   heparin  injection (subcutaneous)  5,000 Units Subcutaneous Q8H   lacosamide   200 mg Per Tube BID   levETIRAcetam   500 mg Per Tube BID   liver oil-zinc  oxide   Topical BID   pantoprazole   40 mg Oral Daily   saccharomyces boulardii  250 mg Per Tube BID   sertraline   25 mg Per Tube Daily   vancomycin   125 mg Per Tube QID   white petrolatum    Topical BID   Continuous Infusions:  cefTRIAXone  (ROCEPHIN )  IV Stopped (01/17/24 0438)   PRN Meds:.acetaminophen , docusate, hydrALAZINE , HYDROmorphone  (DILAUDID ) injection, HYDROmorphone , hydrOXYzine , LORazepam , ondansetron  (ZOFRAN ) IV, mouth rinse, polyethylene glycol   Assessment: Principal Problem:   Sepsis (HCC) Active Problems:   Osteomyelitis (HCC)   Pressure injury of skin    Plan: The patient has positive C. difficile with a positive blood culture for Enterobacterales and E. coli. The patient's bilirubin is normal and the alkaline  phosphatase is elevated due to a stent being in place.  Does not appear that the patient's present symptoms are related to the stent or continued cholangitis at this time.  A repeat MRI may be more informative for the lesion seen on her CT scan.  The patient is not in need of a repeat ERCP at this time.   LOS: 2 days   Rogelia Copping, MD.FACG 01/17/2024, 1:42 PM Pager [phone] 7am-5pm  Check AMION for 5pm -7am coverage and on weekends

## 2024-01-17 NOTE — Consult Note (Signed)
 Chief Complaint: Liver Abscess. Request is for liver abscess aspiration possible drain placement.  Referring Physician(s): Dr. Elvan Sor  Supervising Physician: Hughes Simmonds  Patient Status: [facility] - In-pt  History of Present Illness: Sherry [name] is a 68 y.o. female  inpatient. History of TIA, HTN, breast cancer, Hep C, CHF, ESRD on HD.  Recent ERCP on 6.6.25  for cholangitis with stent placement. Presented to the ED at [facility] on 7.25.25 after being found to be unresponsive and hypotensive after HD. CT abd pelvis from 7.5.25. reads Ill-defined hypodense areas within the periphery of the liver, most evident in the right hepatic lobe, measuring up to 4.9 cm inferiorly on the right. Findings appear progressive compared with the CT from 12/15/2023, and as suggested on the interval MRI, could represent progressively dilated biliary radicles versus intra hepatic abscesses, underlying neoplastic process not excluded.  Now with worsening labs. Team is requesting a liver abscess aspiration possible drain placement.   Unable to obtain ROS.   CRP 32.6, WBC 20.8 < 16.0 < 21.6, albumin 2.0, BUN 63, Cr 3.78, GFR< 12, Alkaline Phosphatase 532  Past Medical History:  Diagnosis Date   Alcohol  use disorder, severe, dependence (HCC) 08/27/2017   Anxiety    Body mass index (BMI) 40.0-44.9, adult (HCC) 07/19/2020   Breast cancer (HCC)    right lumpectemy and lymph node    Cervical spondylosis 05/24/2020   Chronic bilateral low back pain with right-sided sciatica 05/24/2020   Chronic diastolic CHF (congestive heart failure) (HCC) 05/09/2019   Cigarette smoker 12/31/2017   ESRD (end stage renal disease) on dialysis (HCC) 08/22/2021   M-W-F   Essential hypertension 07/06/2017   GERD (gastroesophageal reflux disease)    Hepatitis C virus infection cured after antiviral drug therapy 12/17/2012   Telephone Encounter - Mercie Planas, RN - 12/28/2016 9:54 AM EDT Patient called for HCV RNA test results. EOT  08-26-16 - not detected. 12 week post treatment 12-11-16 - not detected. Informed patient that she was cured. Patient verbalized understanding.          Hx of bipolar disorder 01/31/2016   Impaired functional mobility, balance, gait, and endurance 01/09/2021   MRSA infection    of breast incision   Neuroleptic-induced tardive dyskinesia 09/06/2017   Abilify   Opioid use disorder, severe, dependence (HCC) 08/27/2017   Restless legs syndrome 02/13/2013   Formatting of this note might be different from the original. IMPRESSION: Possible. Will follow.   Subacute dyskinesia due to drug 02/13/2013   Formatting of this note might be different from the original. STORY: Tardive dyskinesia and mild limb dyskinesia, LE>UE which likely due to prolonged antipsychotic used, abilify. Couldn't tolerate artane, gabapentin , clonazepam and xenazine.`E1o3L`IMPRESSION: Well tolerated depakote  250mg  bid, mood aspect is better as well. Will increase to 500mg  bid. ADR was discussed.  RTC 4-6 weeks.   TIA (transient ischemic attack) 2020   P/w BP >230/120 and neurologic symptoms, presumed TIA    Past Surgical History:  Procedure Laterality Date   A/V FISTULAGRAM Right 01/29/2022   Procedure: A/V Fistulagram;  Surgeon: Gretta Lonni PARAS, MD;  Location: [facility] INVASIVE CV LAB;  Service: Cardiovascular;  Laterality: Right;   AV FISTULA PLACEMENT Right 05/01/2021   Procedure: RIGHT ARTERIOVENOUS (AV) FISTULA CREATION;  Surgeon: Lanis Fonda BRAVO, MD;  Location: [facility] OR;  Service: Vascular;  Laterality: Right;  PERIPHERAL NERVE BLOCK   BACK SURGERY  1990   BASCILIC VEIN TRANSPOSITION Right 09/02/2021   Procedure: RIGHT SECOND STAGE BASILIC VEIN TRANSPOSITION;  Surgeon: Lanis Fonda BRAVO, MD;  Location: [facility] OR;  Service: Vascular;  Laterality: Right;  PERIPHERAL NERVE BLOCK   BILIARY STENT PLACEMENT  12/17/2023   Procedure: INSERTION, STENT, BILE DUCT;  Surgeon: Jinny Carmine, MD;  Location: [facility] ENDOSCOPY;  Service: Endoscopy;;    BREAST SURGERY Right 2010   breast cancer survivor - states partial mastectomy and nodes   COLONOSCOPY     [facility]   ERCP N/A 12/17/2023   Procedure: ERCP, WITH INTERVENTION IF INDICATED;  Surgeon: Jinny Carmine, MD;  Location: [facility] ENDOSCOPY;  Service: Endoscopy;  Laterality: N/A;   ESOPHAGOGASTRODUODENOSCOPY     [facility]   ESOPHAGOGASTRODUODENOSCOPY  04/18/2020   [facility]   LAPAROSCOPIC CHOLECYSTECTOMY  2002   LAPAROSCOPIC INCISIONAL / UMBILICAL / VENTRAL HERNIA REPAIR  04/13/2018   with BARD 15x 20cm mesh (supraumbilical)   LAPAROSCOPIC LYSIS OF ADHESIONS  07/12/2018   Procedure: LAPAROSCOPIC LYSIS OF ADHESIONS;  Surgeon: Anitra Freddy NOVAK, MD;  Location: [facility] ORS;  Service: Gynecology;;   MULTIPLE TOOTH EXTRACTIONS     MYOMECTOMY     x 2 prior to hysterectomy   PERIPHERAL VASCULAR BALLOON ANGIOPLASTY Right 01/29/2022   Procedure: PERIPHERAL VASCULAR BALLOON ANGIOPLASTY;  Surgeon: Gretta Lonni PARAS, MD;  Location: [facility] INVASIVE CV LAB;  Service: Cardiovascular;  Laterality: Right;  arm fistula   ROBOTIC ASSISTED BILATERAL SALPINGO OOPHERECTOMY Right 07/12/2018   Procedure: XI ROBOTIC ASSISTED RIGHT SALPINGO OOPHORECTOMY;  Surgeon: Anitra Freddy NOVAK, MD;  Location: [facility] ORS;  Service: Gynecology;  Laterality: Right;   STONE EXTRACTION WITH BASKET  12/17/2023   Procedure: ERCP, WITH LITHROTRIPSY OR REMOVAL OF COMMON BILE DUCT CALCULUS USING BASKET;  Surgeon: Jinny Carmine, MD;  Location: [facility] ENDOSCOPY;  Service: Endoscopy;;   TOTAL ABDOMINAL HYSTERECTOMY     fibroids    UPPER GASTROINTESTINAL ENDOSCOPY     WISDOM TOOTH EXTRACTION      Allergies: Abilify [aripiprazole], Remeron  [mirtazapine ], Trazodone  and nefazodone, Augmentin [amoxicillin -pot clavulanate], Flexeril [cyclobenzaprine], and Amoxicillin   Medications: Prior to Admission medications   Medication Sig Start Date End Date Taking? Authorizing Provider  acetaminophen  (TYLENOL ) 325 MG tablet Take 325 mg by  mouth every 6 (six) hours as needed. 12/30/21  Yes [provider]  albuterol  (VENTOLIN  HFA) 108 (90 Base) MCG/ACT inhaler Inhale 2 puffs into the lungs every 4 (four) hours as needed. 05/24/23  Yes [provider]  ascorbic acid  (VITAMIN C ) 500 MG tablet Place 1 tablet (500 mg total) into feeding tube 2 (two) times daily. 12/24/23  Yes Fausto Sor A, DO  aspirin  81 MG chewable tablet Place 81 mg into feeding tube daily.   Yes [provider]  cetirizine HCl (ZYRTEC) 1 MG/ML solution Take 10 mg by mouth daily.   Yes [provider]  cloBAZam  (ONFI ) 2.5 MG/ML solution Place 2 mLs into feeding tube every 12 (twelve) hours. 12/11/23  Yes [provider]  diphenhydrAMINE  (BENADRYL ) 25 mg capsule Place 25 mg into feeding tube every 6 (six) hours as needed.   Yes [provider]  ferrous sulfate  325 (65 FE) MG tablet Take 325 mg by mouth daily.   Yes [provider]  gabapentin  (NEURONTIN ) 100 MG capsule 100 mg 3 (three) times daily. Via G-tube   Yes [provider]  haloperidol  (HALDOL ) 1 MG tablet Take 1 mg by mouth 2 (two) times a week. Tuesday and Saturday for agitation before dialysis   Yes [provider]  hydrOXYzine  (ATARAX ) 25 MG tablet Take 25 mg by  mouth 3 (three) times daily as needed.   Yes [provider]  lacosamide  (VIMPAT ) 200 MG TABS tablet Place 200 mg into feeding tube 2 (two) times daily.   Yes [provider]  levETIRAcetam  (KEPPRA ) 100 MG/ML solution Place 5 mLs into feeding tube 2 (two) times daily.   Yes [provider]  multivitamin (RENA-VIT) TABS tablet Place 1 tablet into feeding tube at bedtime. 12/24/23  Yes Fausto Sor A, DO  omeprazole  (PRILOSEC OTC) 20 MG tablet Take 20 mg by mouth daily. Per G-tube   Yes [provider]  ondansetron  (ZOFRAN ) 4 MG/5ML solution Place 5 mLs into feeding tube daily.   Yes [provider]  oxyCODONE  (OXY  IR/ROXICODONE ) 5 MG immediate release tablet Take 5 mg by mouth every 4 (four) hours as needed for severe pain (pain score 7-10).   Yes [provider]  pantoprazole  (PROTONIX ) 40 MG tablet Take 1 tablet (40 mg total) by mouth daily. 02/01/22 01/15/24 Yes Drusilla Sabas RAMAN, MD  polyethylene glycol (MIRALAX  / GLYCOLAX ) 17 g packet Take 17 g by mouth daily.   Yes [provider]  senna-docusate (SENOKOT-S) 8.6-50 MG tablet Take 1 tablet by mouth at bedtime as needed for mild constipation. 03/02/23  Yes Long, Fonda MATSU, MD  sertraline  (ZOLOFT ) 25 MG tablet Take 25 mg by mouth daily.   Yes [provider]  sevelamer  carbonate (RENVELA ) 800 MG tablet Place 800 mg into feeding tube 3 (three) times daily with meals.   Yes [provider]  zinc  sulfate, 50mg  elemental zinc , 220 (50 Zn) MG capsule Place 1 capsule (220 mg total) into feeding tube daily. 12/24/23  Yes Fausto Sor A, DO  carvedilol  (COREG ) 12.5 MG tablet Take 25 mg by mouth 2 (two) times daily.    [provider]  cefTAZidime in dextrose  5 % 50 mL Give 1g IV on Tuesdays, 1g IV on Thursdays, 2g IV on Saturdays at dialysis.  Stop date 12/29/2023. 12/24/23   Fausto Sor A, DO  hydrALAZINE  (APRESOLINE ) 50 MG tablet Take 50 mg by mouth 3 (three) times daily. 03/24/21   [provider]  hydrocortisone  cream 1 % Apply 1 Application topically 2 (two) times daily. Patient not taking: Reported on 01/15/2024    [provider]  mupirocin  ointment (BACTROBAN ) 2 % Apply 1 Application topically 2 (two) times daily. Patient not taking: Reported on 01/15/2024    [provider]  Nutritional Supplements (FEEDING SUPPLEMENT, NEPRO CARB STEADY,) LIQD Take 237 mLs by mouth 3 (three) times daily between meals. 12/24/23   Fausto Sor LABOR, DO     Family History  Problem Relation Age of Onset   Heart attack Mother    Breast cancer Mother 40   Dementia Mother    Cancer Father 73       died of  bleeding from kidneys   Colon cancer Neg Hx    Esophageal cancer Neg Hx    Stomach cancer Neg Hx    Rectal cancer Neg Hx     Social History   Socioeconomic History   Marital status: Single    Spouse name: Not on file   Number of children: 0   Years of education: 12   Highest education level: High school graduate  Occupational History   Occupation: Disabled  Tobacco Use   Smoking status: Every Day    Current packs/day: 0.50    Types: Cigarettes    Passive exposure: Never   Smokeless tobacco: Never  Vaping  Use   Vaping status: Former   Start date: 06/27/2013   Quit date: 06/14/2017   Devices: Apple Cinnamon-0 mg  Substance and Sexual Activity   Alcohol  use: Not Currently    Comment: hx over 30 years   Drug use: Not Currently    Comment: h/o IVD use   Sexual activity: Not Currently  Other Topics Concern   Not on file  Social History Narrative   Lives at home alone.   Right-handed.   Caffeine  use: 4 cups coffee/soda   Social Drivers of Corporate investment banker Strain: Not on file  Food Insecurity: Patient Unable To Answer (01/15/2024)   Hunger Vital Sign    Worried About Running Out of Food in the Last Year: Patient unable to answer    Ran Out of Food in the Last Year: Patient unable to answer  Transportation Needs: Patient Unable To Answer (01/15/2024)   PRAPARE - Transportation    Lack of Transportation (Medical): Patient unable to answer    Lack of Transportation (Non-Medical): Patient unable to answer  Physical Activity: Not on file  Stress: Not on file  Social Connections: Patient Unable To Answer (01/15/2024)   Social Connection and Isolation Panel    Frequency of Communication with Friends and Family: Patient unable to answer    Frequency of Social Gatherings with Friends and Family: Patient unable to answer    Attends Religious Services: Patient unable to answer    Active Member of Clubs or Organizations: Patient unable to answer    Attends Tax inspector Meetings: Patient unable to answer    Marital Status: Patient unable to answer    Review of Systems: A 12 point ROS discussed and pertinent positives are indicated in the HPI above.  All other systems are negative.  Review of Systems  Unable to perform ROS: Mental status change    Vital Signs: BP (!) 151/77 (BP Location: Left Arm)   Pulse 98   Temp 99.8 F (37.7 C)   Resp 20   Ht 5' 2 (1.575 m)   Wt 128 lb 4.9 oz (58.2 kg)   SpO2 100%   BMI 23.47 kg/m   Advance Care Plan: The advanced care plan/surrogate decision maker was discussed at the time of visit and documented in the medical record.  In EPIC. Sister Mercer Hint   Physical Exam Vitals and nursing note reviewed.  Constitutional:      Appearance: She is well-developed.  HENT:     Head: Normocephalic and atraumatic.  Eyes:     Conjunctiva/sclera: Conjunctivae normal.  Pulmonary:     Effort: Pulmonary effort is normal.  Musculoskeletal:        General: Normal range of motion.  Skin:    General: Skin is warm.  Neurological:     Comments: Unable to assess     Imaging: CT ABDOMEN PELVIS WO CONTRAST Result Date: 01/15/2024 CLINICAL DATA:  Abdomen pain unresponsive EXAM: CT ABDOMEN AND PELVIS WITHOUT CONTRAST TECHNIQUE: Multidetector CT imaging of the abdomen and pelvis was performed following the standard protocol without IV contrast. RADIATION DOSE REDUCTION: This exam was performed according to the departmental dose-optimization program which includes automated exposure control, adjustment of the mA and/or kV according to patient size and/or use of iterative reconstruction technique. COMPARISON:  MRI 12/16/2023, CT 12/15/2023, 04/06/2023 FINDINGS: Lower chest: Lung bases demonstrate scarring or atelectasis at the lung bases. Partially visualized catheter in the right atrium. Trace pericardial effusion. Mitral calcification. Hepatobiliary: Interim placement of  biliary stent, proximal part of the stent is in  the common bile duct, distal portion of the stent is in the third portion of duodenum. There is residual intra hepatic biliary dilatation. Focal ill-defined hypodense areas within the periphery of the liver, most evident in the right hepatic lobe, measuring up to 2.3 cm by 4.9 cm inferiorly on the right, series 2, image 33. Common bile duct diameter 2.2 cm, air in the common bile duct. Pancreas: Unremarkable. No pancreatic ductal dilatation or surrounding inflammatory changes. Spleen: Normal in size without focal abnormality. Adrenals/Urinary Tract: Right adrenal gland is normal. 2 cm left adrenal nodule, stable and presumably representing adenoma. Atrophic kidneys without hydronephrosis. Renal cysts for which no imaging follow-up is recommended. Small hyperdense subcentimeter lesions at the mid left kidney too small to further characterize, no specific imaging follow-up is recommended. Thick walled appearance of the urinary bladder Stomach/Bowel: Gastrostomy tube with tip in the body of the stomach. Intra-abdominal portion of tubing appears to course along the anterior wall of the splenic flexure with some haziness around tubing but no frank perforation of the colon. There is no dilated small bowel. Colon is dilated and contains both fluid and large stool mixed with high density material. There may be some colon wall thickening at the rectosigmoid colon. Vascular/Lymphatic: Aortic atherosclerosis. No enlarged abdominal or pelvic lymph nodes. Reproductive: Hysterectomy.  No adnexal mass Other: No free air. No significant ascites. Presacral edema, small volume fluid, and stranding. Musculoskeletal: Posterior spinal fusion hardware with interbody device at L4-L5. Chronic irregular degenerative change at L5-S1. Sacral coccygeal decubitus ulcer extends down to the bone. Mild erosive change at the sacrococcygeal region concerning for osteomyelitis. Coccygeal segments are displaced anteriorly into the pelvis, posterior to  the rectum with surrounding fluid, sagittal series 6, image 65. Generalized subcutaneous edema. Evidence of prior ventral hernia repair. Chronic advanced degenerative change of the right hip with suspected AVN and mild fragmentation of femoral head. IMPRESSION: 1. Interim placement of biliary stent with residual intra and extrahepatic biliary dilatation. Ill-defined hypodense areas within the periphery of the liver, most evident in the right hepatic lobe, measuring up to 4.9 cm inferiorly on the right. Findings appear progressive compared with the CT from 12/15/2023, and as suggested on the interval MRI, could represent progressively dilated biliary radicles versus intra hepatic abscesses, underlying neoplastic process not excluded. 2. Sacral coccygeal decubitus ulcer extends down to the bone. Mild erosive change at the sacrococcygeal region concerning for osteomyelitis. Coccygeal bone segments/fragments are displaced anteriorly into the pelvis, posterior to the rectum with surrounding fluid. 3. Gastrostomy tube with tip in the body of the stomach. Intra-abdominal portion of tubing appears to course along the anterior wall of the splenic flexure with some haziness around tubing but no frank perforation of the colon. 4. Dilated colon containing both fluid and large stool mixed with high density material. Large fecal impaction at the rectum. There may be some colon wall thickening at the rectosigmoid colon, question colitis. 5. Decompressed thick walled appearance of the urinary bladder, question cystitis. 6. Atrophic kidneys. 7. 2 cm left adrenal nodule, stable and presumably representing adenoma. 8. Aortic atherosclerosis. Aortic Atherosclerosis (ICD10-I70.0). Electronically Signed   By: Luke Bun M.D.   On: 01/15/2024 15:30   CT Head Wo Contrast Result Date: 01/15/2024 CLINICAL DATA:  Delirium hypotensive unresponsive EXAM: CT HEAD WITHOUT CONTRAST TECHNIQUE: Contiguous axial images were obtained from the  base of the skull through the vertex without intravenous contrast. RADIATION DOSE REDUCTION: This exam  was performed according to the departmental dose-optimization program which includes automated exposure control, adjustment of the mA and/or kV according to patient size and/or use of iterative reconstruction technique. COMPARISON:  CT brain 12/15/2023, MRI 09/20/2023 FINDINGS: Brain: No acute territorial infarction, hemorrhage or intracranial mass. Atrophy and moderate advanced chronic small vessel ischemic changes of the white matter. Stable ventricle size. Small chronic infarcts in the left basal ganglia and cerebellum. Vascular: No hyperdense vessels.  Carotid vascular calcification Skull: Partially excluded right frontal bone.  No obvious fracture Sinuses/Orbits: Partially excluded right orbit. No obvious acute abnormality Other: None IMPRESSION: 1. No CT evidence for acute intracranial abnormality. 2. Atrophy and chronic small vessel ischemic changes of the white matter. Small chronic infarcts in the left basal ganglia and cerebellum. Electronically Signed   By: Luke Bun M.D.   On: 01/15/2024 15:05   DG Chest Port 1 View Result Date: 01/15/2024 CLINICAL DATA:  Hypotensive in unresponsive. Evaluate for abnormality. EXAM: PORTABLE CHEST 1 VIEW COMPARISON:  CT 12/15/2023 FINDINGS: There is a right chest wall dialysis catheter with tips in the right atrium. Heart size is normal. Aortic atherosclerotic calcifications. Linear scar versus platelike atelectasis noted within the lung bases. No pleural effusion, interstitial edema or consolidative change. Postop change from ACDF in the lower cervical spine. IMPRESSION: 1. Bibasilar scar versus platelike atelectasis. Electronically Signed   By: Waddell Calk M.D.   On: 01/15/2024 13:17    Labs:  CBC: Recent Labs    12/23/23 0405 01/15/24 1300 01/16/24 0419 01/17/24 0226  WBC 10.3 21.6* 16.0* 20.8*  HGB 8.7* 9.1* 9.2* 7.4*  HCT 28.2* 29.7* 29.2*  23.8*  PLT 391 434* 398 371    COAGS: Recent Labs    12/15/23 1820 12/15/23 1955 01/15/24 1300  INR 1.3*  --  1.1  APTT  --  40*  --     BMP: Recent Labs    12/23/23 0405 01/15/24 1300 01/16/24 0419 01/17/24 0226  NA 137 134* 135 136  K 3.9 3.7 3.7 3.8  CL 96* 95* 97* 98  CO2 29 21* 20* 21*  GLUCOSE 74 148* 104* 80  BUN 33* 33* 45* 63*  CALCIUM  8.8* 8.3* 8.2* 8.5*  CREATININE 3.09* 3.10* 3.14* 3.78*  GFRNONAA 16* 16* 16* 12*    LIVER FUNCTION TESTS: Recent Labs    12/20/23 0114 12/21/23 0800 01/15/24 1300 01/16/24 0419 01/17/24 0226  BILITOT 0.8 0.8 0.7  --  0.9  AST 91* 75* 89*  --  67*  ALT 28 21 26   --  23  ALKPHOS 714* 717* 862*  --  532*  PROT 7.1 6.8 7.4  --  5.8*  ALBUMIN 2.2* 2.2* 2.3* 2.1* 2.0*      Assessment and Plan:  68 y.o. female inpatient. History of TIA, HTN, breast cancer, Hep C, CHF, ESRD on HD.  Recent ERCP on 6.6.25  for cholangitis with stent placement. Presented to the ED at [facility] on 7.25.25 after being found to be unresponsive and hypotensive after HD. CT abd pelvis from 7.5.25. reads Ill-defined hypodense areas within the periphery of the liver, most evident in the right hepatic lobe, measuring up to 4.9 cm inferiorly on the right. Findings appear progressive compared with the CT from 12/15/2023, and as suggested on the interval MRI, could represent progressively dilated biliary radicles versus intra hepatic abscesses, underlying neoplastic process not excluded.  Now with worsening labs. Team is requesting a liver abscess aspiration possible drain placement.   PLAN: Image Guided Liver  Abscess Aspiration Possible Drain Placement    Thank you for this interesting consult.  I greatly enjoyed meeting Jakeisha Stricker and look forward to participating in their care.  A copy of this report was sent to the requesting provider on this date.  Electronically Signed: Delon JAYSON Beagle, NP 01/17/2024, 2:35 PM   I spent a total of 40 Minutes     in face to face in clinical consultation, greater than 50% of which was counseling/coordinating care for liver abscess aspiration possible drain placement.

## 2024-01-17 NOTE — Progress Notes (Signed)
   01/17/24 1140  Spiritual Encounters  Type of Visit Initial  Care provided to: Pt and family  Referral source Nurse (RN/NT/LPN) (Spiritual consult)  Reason for visit Routine spiritual support  OnCall Visit No  Spiritual Framework  Presenting Themes Meaning/purpose/sources of inspiration;Values and beliefs;Caregiving needs;Coping tools  Interventions  Spiritual Care Interventions Made Established relationship of care and support;Compassionate presence;Reflective listening;Normalization of emotions;Prayer;Encouragement  Intervention Outcomes  Outcomes Connection to spiritual care;Awareness around self/spiritual resourses;Awareness of support  Spiritual Care Plan  Spiritual Care Issues Still Outstanding No further spiritual care needs at this time (see row info)   Chaplain responded to spiritual consult for prayer. Pt's sister, Sherry [name], requested prayer for herself as a caregiver and her sister as the pt. Family has seen a lot of loss over the last year and chaplain encouraged Sherry [name] to take care of herself while she takes care of her sister. Chaplain also suggested that she look into a grief share program.

## 2024-01-17 NOTE — Progress Notes (Addendum)
 [facility] Progress Note  Patient: Sherry [name]    FMW:[id number]  DOA: 01/15/2024     Date of Service: the patient was seen and examined on 01/17/2024  Chief Complaint  Patient presents with   Altered Mental Status   Brief hospital course: [name] is a 68 year old female patient with a past medical history of end-stage renal disease on HD MWF via right tunneled cath, seizure disorder, chronic hepatitis C, opioid use disorder, stroke, status post gastrostomy tube, hypertension, diastolic congestive heart failure, depression with anxiety, bipolar, breast cancer status post right mastectomy presenting to [facility] on 07/05 for confusion and altered mental status and hypotension.   She received dialysis earlier today and was reportedly described as being somnolent throughout the dialysis session.  She went back to her rehab and was found to be confused and unresponsive by the team and therefore EMS were called.  On arrival EMS she was found to have systolics in the 80s and therefore she was brought in to [facility] ED.   Of note, she was recently admitted to [facility] on 06/04 to 06/13 with E. coli bacteremia and signs of cholangitis.  She underwent ERCP on 06/06 and was noted to have choledocholithiasis, sphincterotomy was done and a plastic stent was placed.   In the ED, she was found to be hypotensive with MAP low 60s, she received 1 L normal saline bolus with good response to fluids.  She was started on broad-spectrum antibiotics with vancomycin  and aztreonam  and Flagyl .   Labs: Lactate 6.1 WBC 21.6 with 94% neutrophils, hemoglobin 9.1 and hematocrit 29.7, platelets 434. Electrolyte with CO2 21, BUN 33 and creatinine 3.1.  Potassium 3.7.  Chest x-ray without any acute process.  01/15/2024 admitted to the ICU.  Patient was given IV fluid, blood pressure stable, did not require pressor support.  Downgraded under [facility] service on 01/16/2024  Assessment and Plan:  # Metabolic encephalopathy due to  sepsis Continue supportive care, fall precautions, aspiration precaution Continue to treat as below   # Sepsis, multifactorial could be due to C. difficile and E. coli bacteremia, decub osteomyelitis and liver abscess H/o recent instrumentation with ERCP sphincterotomy and stent placement (12/17/2023)  Seen by GI recommended no need of repeat ERCP, may repeat MRI to look at the liver lesion. S/p vancomycin  and cefepime  Blood culture growing E. coli, continue ceftriaxone  Stool positive for C. difficile, started vancomycin  enteric via G-tube Vital signs stable, no need of vasopressors Transferred to progressive unit Follow ID for further recommendation  # C. difficile gastroenteritis 7/6 started  vancomycin  125 mg p.o. Q6 hourly for 10 days Started probiotics   # Liver abscess GI consulted, recommended MRI versus CT scan with contrast Follow ID consult IR consulted for CT/US  guided aspiration versus drain insertion, which will be done tomorrow a.m.   # Decub osteomyelitis General Surgery consulted, recommended no intervention, less chances of healing Turn patient every 2 hours Follow ID for antibiotics   # ESRD on hemodialysis, MWF schedule Right tunneled catheter need to be removed due to E. coli bacteremia Nephrology consulted vascular surgery to remove removed tunneled HD catheter. Follow nephro for further recommendation  # Seizure disorder Continue Keppra  500 twice daily clobazam  5 mg twice daily Vimpat  200 mg twice daily Follow EEG  # Depression and bipolar disorder Resumed Haldol  and Zoloft  home dose  # HTN, diastolic CHF Continue to monitor BP and titrate medications accordingly Presented with sepsis, we will continue to monitor closely   # History of  chronic hep C Follow-up with GI as an outpatient # History of breast cancer, no active issues, follow-up with oncology as an outpatient  # Iron deficiency, Tsat 4% Patient will benefit from IV iron infusion,  cannot be given now due to acute infection.  Patient can be evaluated as an outpatient for IV iron transfusion Start oral iron supplement on discharge with vitamin C    Body mass index is 23.47 kg/m.  Interventions:   Diet: N.p.o. DVT Prophylaxis: Subcutaneous Heparin     Advance goals of care discussion: Full code  Family Communication: family was present at bedside, at the time of interview.  The pt provided permission to discuss medical plan with the family. Opportunity was given to ask question and all questions were answered satisfactorily.   Disposition:  Pt is from SNF, admitted with sepsis, still on IV antibiotic, which precludes a safe discharge. Discharge to SNF, when stable, may need few days to improve.  Subjective: No significant events overnight, patient remains sleepy and lethargic and unresponsive. Not in acute distress. Management plan discussed patient's sister at bedside and her kids over the phone, all question and concerns answered   Physical Exam: General: NAD, lying comfortably Appear in no distress Eyes: closed ENT: Oral Mucosa dry   Neck: no JVD,  Cardiovascular: S1 and S2 Present, no Murmur,  Respiratory: good respiratory effort, Bilateral Air entry equal and Decreased, no Crackles, no wheezes Abdomen: Bowel Sound present, Soft and no tenderness,  Extremities: no Pedal edema, no calf tenderness Neurologic: without any new focal findings Gait not checked due to patient safety concerns  Vitals:   01/17/24 0500 01/17/24 0850 01/17/24 1304 01/17/24 1600  BP:  137/70 (!) 151/77 128/69  Pulse:  93 98 86  Resp:    14  Temp:  98.4 F (36.9 C) 99.8 F (37.7 C) 99.6 F (37.6 C)  TempSrc:      SpO2:  100% 100% 100%  Weight: 58.2 kg     Height:        Intake/Output Summary (Last 24 hours) at 01/17/2024 1650 Last data filed at 01/17/2024 0615 Gross per 24 hour  Intake 100 ml  Output --  Net 100 ml   Filed Weights   01/15/24 1203 01/15/24 1539  01/17/24 0500  Weight: 54.3 kg 53.6 kg 58.2 kg    Data Reviewed: I have personally reviewed and interpreted daily labs, tele strips, imagings as discussed above. I reviewed all nursing notes, pharmacy notes, vitals, pertinent old records I have discussed plan of care as described above with RN and patient/family.  CBC: Recent Labs  Lab 01/15/24 1300 01/16/24 0419 01/17/24 0226  WBC 21.6* 16.0* 20.8*  NEUTROABS 20.3*  --   --   HGB 9.1* 9.2* 7.4*  HCT 29.7* 29.2* 23.8*  MCV 93.4 89.8 90.5  PLT 434* 398 371   Basic Metabolic Panel: Recent Labs  Lab 01/15/24 1300 01/16/24 0419 01/17/24 0226  NA 134* 135 136  K 3.7 3.7 3.8  CL 95* 97* 98  CO2 21* 20* 21*  GLUCOSE 148* 104* 80  BUN 33* 45* 63*  CREATININE 3.10* 3.14* 3.78*  CALCIUM  8.3* 8.2* 8.5*  MG  --  2.1 2.3  PHOS  --  4.7* 5.3*    Studies: No results found.   Scheduled Meds:  ascorbic acid   500 mg Per Tube BID   carvedilol   6.25 mg Per Tube BID   Chlorhexidine  Gluconate Cloth  6 each Topical Daily   cloBAZam   5 mg  Per Tube Q12H   [START ON 01/18/2024] collagenase    Topical Daily   feeding supplement (PROSource TF20)  60 mL Per Tube Daily   fentaNYL  (SUBLIMAZE ) injection  25 mcg Intravenous Once   [START ON 01/18/2024] haloperidol   1 mg Per Tube Once per day on Tuesday Saturday   heparin  injection (subcutaneous)  5,000 Units Subcutaneous Q8H   lacosamide   200 mg Per Tube BID   levETIRAcetam   500 mg Per Tube BID   liver oil-zinc  oxide   Topical BID   multivitamin  1 tablet Per Tube QHS   [START ON 01/18/2024] nutrition supplement (JUVEN)  1 packet Per Tube BID BM   pantoprazole   40 mg Oral Daily   saccharomyces boulardii  250 mg Per Tube BID   sertraline   25 mg Per Tube Daily   thiamine   100 mg Per Tube Daily   vancomycin   125 mg Per Tube QID   white petrolatum    Topical BID   zinc  sulfate (50mg  elemental zinc )  220 mg Per Tube Daily   Continuous Infusions:  cefTRIAXone  (ROCEPHIN )  IV Stopped (01/17/24  0438)   feeding supplement (NEPRO CARB STEADY)     PRN Meds: acetaminophen , docusate, hydrALAZINE , HYDROmorphone  (DILAUDID ) injection, HYDROmorphone , hydrOXYzine , LORazepam , ondansetron  (ZOFRAN ) IV, mouth rinse, polyethylene glycol  Time spent: 55 minutes  Author: ELVAN SOR. MD [facility] 01/17/2024 4:50 PM  To reach On-call, see care teams to locate the attending and reach out to them via [email or web address]. If 7PM-7AM, please contact night-coverage If you still have difficulty reaching the attending provider, please page the [facility] (Director on Call) for [facility] on amion for assistance.

## 2024-01-17 NOTE — Progress Notes (Addendum)
 Initial Nutrition Assessment  DOCUMENTATION CODES:   Severe malnutrition in context of chronic illness  INTERVENTION:   -TF via g-tube:  Initiate Nepro @ 20 ml/hr and increase by 10 ml every 12 hours to goal rate of 40 ml/hr.   60 ml Prosource TF daily  30 ml free water  flush every 4 hours  Tube feeding regimen provides 1808 kcal (100% of needs), 98 grams of protein, and 698 ml of H2O. Total free water : 878 ml daily  -Monitor Mg, K, and Phos and replete as needed secondary to refeeding risk -Renal MVI daily via tube -500 mg vitamin C  BID via tube -220 mg zinc  sulfate daily x 14 days via tube -100 mg thiamine  daily x 7 days -1 packet Juven BID via tube, each packet provides 95 calories, 2.5 grams of protein (collagen), and 9.8 grams of carbohydrate (3 grams sugar); also contains 7 grams of L-arginine and L-glutamine, 300 mg vitamin C , 15 mg vitamin E, 1.2 mcg vitamin B-12, 9.5 mg zinc , 200 mg calcium , and 1.5 g  Calcium  Beta-hydroxy-Beta-methylbutyrate to support wound healing  -Monitor and supplement for possible micronutrient deficiencies which may be impeding wound healing: copper  and vitamin A   NUTRITION DIAGNOSIS:   Severe Malnutrition related to chronic illness (ESRD on HD) as evidenced by percent weight loss, moderate fat depletion, severe fat depletion, moderate muscle depletion, severe muscle depletion.  GOAL:   Patient will meet greater than or equal to 90% of their needs  MONITOR:   TF tolerance  REASON FOR ASSESSMENT:   Consult Enteral/tube feeding initiation and management  ASSESSMENT:   Pt with a past medical history of end-stage renal disease on HD MWF via right tunneled cath, seizure disorder, chronic hepatitis C, opioid use disorder, stroke, status post gastrostomy tube, hypertension, diastolic congestive heart failure, depression with anxiety, bipolar, breast cancer status post right mastectomy presented for confusion and altered mental status and  hypotension.  Pt admitted with sepsis with high suspicion for intra-abdominal abscess.   Per [facility] notes, pt with stage 4 sacral pressure injury and stage 2 pressure injury to rt ear.   Per GI notes, pt with positive c-diff, Enterobacterales, and E. Coli.   Per nephrology notes, plan to hold HD for line holiday; plan to remove permacath. Last HD treatment on 01/15/24.  Per IR notes, plan for image guided liver abscess aspiration and possible drain placement.   Pt lying in bed at time of visit. She was very lethargic; did not respond to voice or touch. No family at bedside.   Pt is a resident of [facility]. Reviewed MAR: pt was on a pureed/ renal diet PTA. Pt also with g-tube, but unsure of home regimen (received 100 mg free water  flush TID). Pt had ordered for Nepro TID orally.   Reviewed wt hx; pt has experienced a 16% wt loss over the past 8 months, which is significant for time frame. Per nephrology notes, EDW 56 kg. Pt currently over dry weight.   Medications reviewed and include keppra , haldol , and rocephin .   Palliative care following for goals of care discussions.   Labs reviewed: CBGS: 115 (inpatient orders for glycemic control are none).    NUTRITION - FOCUSED PHYSICAL EXAM:  Flowsheet Row Most Recent Value  Orbital Region Severe depletion  Upper Arm Region Moderate depletion  Thoracic and Lumbar Region Severe depletion  Buccal Region Moderate depletion  Temple Region Severe depletion  Clavicle Bone Region Moderate depletion  Clavicle and Acromion Bone Region Moderate depletion  Scapular Bone Region Moderate depletion  Dorsal Hand Moderate depletion  Patellar Region Severe depletion  Anterior Thigh Region Severe depletion  Posterior Calf Region Severe depletion  Edema (RD Assessment) None  Hair Reviewed  Eyes Reviewed  Mouth Reviewed  Skin Reviewed  Nails Reviewed    Diet Order:   Diet Order             Diet NPO time specified  Diet effective now                    EDUCATION NEEDS:   Not appropriate for education at this time  Skin:  Skin Assessment: Skin Integrity Issues: Skin Integrity Issues:: Stage IV, Stage II Stage II: rt ear Stage IV: sacrum  Last BM:  01/17/24 (type 6)  Height:   Ht Readings from Last 1 Encounters:  01/15/24 5' 2 (1.575 m)    Weight:   Wt Readings from Last 1 Encounters:  01/17/24 58.2 kg    Ideal Body Weight:  50 kg  BMI:  Body mass index is 23.47 kg/m.  Estimated Nutritional Needs:   Kcal:  1700-1900  Protein:  90-105 grams  Fluid:  1000 ml + UOP    Margery ORN, RD, LDN, CDCES Registered Dietitian III Certified Diabetes Care and Education Specialist If unable to reach this RD, please use RD Inpatient group chat on secure chat between hours of 8am-4 pm daily

## 2024-01-17 NOTE — Progress Notes (Signed)
 Eeg done

## 2024-01-17 NOTE — Care Management Important Message (Signed)
 Important Message  Patient Details  Name: Sherry [name] MRN: [id number] Date of Birth: 06/22/1956   Important Message Given:  Yes - Medicare IM     Rojelio [name] 01/17/2024, 2:46 PM

## 2024-01-17 NOTE — Consult Note (Addendum)
 NAME: Sherry [name]  DOB: 1955-07-25  MRN: [id number]  Date/Time: 01/17/2024 9:02 AM  REQUESTING PROVIDERDr. Von  Subjective:  REASON FOR CONSULT:: E. coli bacteremia No history available from patient.  Chart reviewed.  Spoke to the sister at bedside Sherry [name] is a 68 y.o. female complicated with history and recurrent hospitalizations in the past 12 months presents from [facility] with altered mental status on 01/15/2024. She has a history of end-stage renal disease on hemodialysis, sacral decubitus with osteomyelitis, history of ascending cholangitis with multiple liver abscesses, E. coli bacteremia in June 2025, CVA, seizure, hypertension, was recently in hospital between December 18, 2023 until December 24, 2023 and was seen by infectious disease for E. coli bacteremia and was treated with IV Unasyn for 2 weeks.  An MRCP done during that time showed  dilated biliary radicles of the right side of the liver.  The bile ducts had an appearance of string of pearl with multiple areas of focal narrowing concerning for cholangitis.  There was CBD dilatation.  She underwent ERCP and plastic stent placement.  At that time patient also had Enterococcus in the sacral wound which was resistant to Vanco but susceptible to ampicillin Patient was discharged to [facility] health care during last hospitalization Prior to this hospitalization she was at [facility] in the month of February for hypertension, PRES, seizures and during that hospitalization had tracheostomy as well as PEG.  The tracheostomy site is closed.  Patient continues to have PEG In the ED vitals BP of 96/55, temperature of 98.6, pulse 100, respiratory rate 17 Labs revealed WBC 21.6, Hb 9.1, platelet 434 and creatinine of 3.10.  Alkaline phosphatase was high around 600 Blood cultures were sent I am seeing the patient as the blood culture was positive for E. coli. Patient is also positive for Clostridium    Past Medical History:  Diagnosis Date    Alcohol  use disorder, severe, dependence (HCC) 08/27/2017   Anxiety    Body mass index (BMI) 40.0-44.9, adult (HCC) 07/19/2020   Breast cancer (HCC)    right lumpectemy and lymph node    Cervical spondylosis 05/24/2020   Chronic bilateral low back pain with right-sided sciatica 05/24/2020   Chronic diastolic CHF (congestive heart failure) (HCC) 05/09/2019   Cigarette smoker 12/31/2017   ESRD (end stage renal disease) on dialysis (HCC) 08/22/2021   M-W-F   Essential hypertension 07/06/2017   GERD (gastroesophageal reflux disease)    Hepatitis C virus infection cured after antiviral drug therapy 12/17/2012   Telephone Encounter - Mercie Planas, RN - 12/28/2016 9:54 AM EDT Patient called for HCV RNA test results. EOT 08-26-16 - not detected. 12 week post treatment 12-11-16 - not detected. Informed patient that she was cured. Patient verbalized understanding.          Hx of bipolar disorder 01/31/2016   Impaired functional mobility, balance, gait, and endurance 01/09/2021   MRSA infection    of breast incision   Neuroleptic-induced tardive dyskinesia 09/06/2017   Abilify   Opioid use disorder, severe, dependence (HCC) 08/27/2017   Restless legs syndrome 02/13/2013   Formatting of this note might be different from the original. IMPRESSION: Possible. Will follow.   Subacute dyskinesia due to drug 02/13/2013   Formatting of this note might be different from the original. STORY: Tardive dyskinesia and mild limb dyskinesia, LE>UE which likely due to prolonged antipsychotic used, abilify. Couldn't tolerate artane, gabapentin , clonazepam and xenazine.`E1o3L`IMPRESSION: Well tolerated depakote  250mg  bid, mood aspect is better as well. Will increase  to 500mg  bid. ADR was discussed.  RTC 4-6 weeks.   TIA (transient ischemic attack) 2020   P/w BP >230/120 and neurologic symptoms, presumed TIA    Past Surgical History:  Procedure Laterality Date   A/V FISTULAGRAM Right 01/29/2022   Procedure: A/V  Fistulagram;  Surgeon: Gretta Lonni PARAS, MD;  Location: [facility] INVASIVE CV LAB;  Service: Cardiovascular;  Laterality: Right;   AV FISTULA PLACEMENT Right 05/01/2021   Procedure: RIGHT ARTERIOVENOUS (AV) FISTULA CREATION;  Surgeon: Lanis Fonda BRAVO, MD;  Location: [facility] OR;  Service: Vascular;  Laterality: Right;  PERIPHERAL NERVE BLOCK   BACK SURGERY  1990   BASCILIC VEIN TRANSPOSITION Right 09/02/2021   Procedure: RIGHT SECOND STAGE BASILIC VEIN TRANSPOSITION;  Surgeon: Lanis Fonda BRAVO, MD;  Location: [facility] OR;  Service: Vascular;  Laterality: Right;  PERIPHERAL NERVE BLOCK   BILIARY STENT PLACEMENT  12/17/2023   Procedure: INSERTION, STENT, BILE DUCT;  Surgeon: Jinny Carmine, MD;  Location: [facility] ENDOSCOPY;  Service: Endoscopy;;   BREAST SURGERY Right 2010   breast cancer survivor - states partial mastectomy and nodes   COLONOSCOPY     [facility]   ERCP N/A 12/17/2023   Procedure: ERCP, WITH INTERVENTION IF INDICATED;  Surgeon: Jinny Carmine, MD;  Location: [facility] ENDOSCOPY;  Service: Endoscopy;  Laterality: N/A;   ESOPHAGOGASTRODUODENOSCOPY     [facility]   ESOPHAGOGASTRODUODENOSCOPY  04/18/2020   [facility]   LAPAROSCOPIC CHOLECYSTECTOMY  2002   LAPAROSCOPIC INCISIONAL / UMBILICAL / VENTRAL HERNIA REPAIR  04/13/2018   with BARD 15x 20cm mesh (supraumbilical)   LAPAROSCOPIC LYSIS OF ADHESIONS  07/12/2018   Procedure: LAPAROSCOPIC LYSIS OF ADHESIONS;  Surgeon: Anitra Freddy NOVAK, MD;  Location: [facility] ORS;  Service: Gynecology;;   MULTIPLE TOOTH EXTRACTIONS     MYOMECTOMY     x 2 prior to hysterectomy   PERIPHERAL VASCULAR BALLOON ANGIOPLASTY Right 01/29/2022   Procedure: PERIPHERAL VASCULAR BALLOON ANGIOPLASTY;  Surgeon: Gretta Lonni PARAS, MD;  Location: [facility] INVASIVE CV LAB;  Service: Cardiovascular;  Laterality: Right;  arm fistula   ROBOTIC ASSISTED BILATERAL SALPINGO OOPHERECTOMY Right 07/12/2018   Procedure: XI ROBOTIC ASSISTED RIGHT SALPINGO OOPHORECTOMY;  Surgeon: Anitra Freddy NOVAK, MD;  Location: [facility] ORS;  Service: Gynecology;  Laterality: Right;   STONE EXTRACTION WITH BASKET  12/17/2023   Procedure: ERCP, WITH LITHROTRIPSY OR REMOVAL OF COMMON BILE DUCT CALCULUS USING BASKET;  Surgeon: Jinny Carmine, MD;  Location: [facility] ENDOSCOPY;  Service: Endoscopy;;   TOTAL ABDOMINAL HYSTERECTOMY     fibroids    UPPER GASTROINTESTINAL ENDOSCOPY     WISDOM TOOTH EXTRACTION      Social History   Socioeconomic History   Marital status: Single    Spouse name: Not on file   Number of children: 0   Years of education: 12   Highest education level: High school graduate  Occupational History   Occupation: Disabled  Tobacco Use   Smoking status: Every Day    Current packs/day: 0.50    Types: Cigarettes    Passive exposure: Never   Smokeless tobacco: Never  Vaping Use   Vaping status: Former   Start date: 06/27/2013   Quit date: 06/14/2017   Devices: Apple Cinnamon-0 mg  Substance and Sexual Activity   Alcohol  use: Not Currently    Comment: hx over 30 years   Drug use: Not Currently    Comment: h/o IVD use   Sexual activity: Not Currently  Other Topics Concern   Not on file  Social History Narrative   Lives at home alone.   Right-handed.   Caffeine  use: 4 cups coffee/soda   Social Drivers of Corporate investment banker Strain: Not on file  Food Insecurity: Patient Unable To Answer (01/15/2024)   Hunger Vital Sign    Worried About Running Out of Food in the Last Year: Patient unable to answer    Ran Out of Food in the Last Year: Patient unable to answer  Transportation Needs: Patient Unable To Answer (01/15/2024)   PRAPARE - Transportation    Lack of Transportation (Medical): Patient unable to answer    Lack of Transportation (Non-Medical): Patient unable to answer  Physical Activity: Not on file  Stress: Not on file  Social Connections: Patient Unable To Answer (01/15/2024)   Social Connection and Isolation Panel    Frequency of Communication with Friends and  Family: Patient unable to answer    Frequency of Social Gatherings with Friends and Family: Patient unable to answer    Attends Religious Services: Patient unable to answer    Active Member of Clubs or Organizations: Patient unable to answer    Attends Banker Meetings: Patient unable to answer    Marital Status: Patient unable to answer  Intimate Partner Violence: Patient Unable To Answer (01/15/2024)   Humiliation, Afraid, Rape, and Kick questionnaire    Fear of Current or Ex-Partner: Patient unable to answer    Emotionally Abused: Patient unable to answer    Physically Abused: Patient unable to answer    Sexually Abused: Patient unable to answer    Family History  Problem Relation Age of Onset   Heart attack Mother    Breast cancer Mother 26   Dementia Mother    Cancer Father 39       died of bleeding from kidneys   Colon cancer Neg Hx    Esophageal cancer Neg Hx    Stomach cancer Neg Hx    Rectal cancer Neg Hx    Allergies  Allergen Reactions   Abilify [Aripiprazole] Other (See Comments)    Tardive dyskinesia Oral   Remeron  [Mirtazapine ] Other (See Comments)    Wgt stimulation /gain, Dizziness, Patient says can tolerate   Trazodone  And Nefazodone Other (See Comments)    Nightmares/sleep diturbance   Augmentin [Amoxicillin -Pot Clavulanate]    Flexeril [Cyclobenzaprine] Other (See Comments)    Pt states Flexeril makes her feel depressed    Amoxicillin  Diarrhea   I? Current Facility-Administered Medications  Medication Dose Route Frequency Provider Last Rate Last Admin   acetaminophen  (TYLENOL ) tablet 650 mg  650 mg Per Tube Q6H PRN Von Bellis, MD       ascorbic acid  (VITAMIN C ) tablet 500 mg  500 mg Per Tube BID Von Bellis, MD   500 mg at 01/16/24 2233   carvedilol  (COREG ) tablet 6.25 mg  6.25 mg Per Tube BID Von Bellis, MD       cefTRIAXone  (ROCEPHIN ) 2 g in sodium chloride  0.9 % 100 mL IVPB  2 g Intravenous Q24H Kathrene Almarie Bake, NP    Stopped at 01/17/24 9561   Chlorhexidine  Gluconate Cloth 2 % PADS 6 each  6 each Topical Daily Assaker, Darrin, MD   6 each at 01/16/24 0930   cloBAZam  (ONFI ) 2.5 MG/ML oral suspension 5 mg  5 mg Per Tube Q12H Von Bellis, MD   5 mg at 01/16/24 2234   [START ON 01/18/2024] collagenase  (SANTYL ) ointment   Topical Daily Von Bellis, MD  docusate sodium  (COLACE) capsule 100 mg  100 mg Oral BID PRN Assaker, Darrin, MD       fentaNYL  (SUBLIMAZE ) injection 25 mcg  25 mcg Intravenous Once Quale, Mark, MD       NOREEN ON 01/18/2024] haloperidol  (HALDOL ) tablet 1 mg  1 mg Per Tube Once per day on Tuesday Saturday Niels Kayla FALCON, [facility]       heparin  injection 5,000 Units  5,000 Units Subcutaneous Q8H Von Bellis, MD   5,000 Units at 01/17/24 9381   hydrALAZINE  (APRESOLINE ) tablet 50 mg  50 mg Per Tube Q6H PRN Von Bellis, MD       HYDROmorphone  (DILAUDID ) injection 1 mg  1 mg Intravenous Q4H PRN Von Bellis, MD   1 mg at 01/16/24 9068   HYDROmorphone  (DILAUDID ) tablet 2 mg  2 mg Per Tube Q6H PRN Von Bellis, MD       hydrOXYzine  (ATARAX ) tablet 25 mg  25 mg Per Tube TID PRN Von Bellis, MD       lacosamide  (VIMPAT ) tablet 200 mg  200 mg Per Tube BID Von Bellis, MD   200 mg at 01/16/24 2233   levETIRAcetam  (KEPPRA ) 100 MG/ML solution 500 mg  500 mg Per Tube BID Von Bellis, MD   500 mg at 01/16/24 2329   liver oil-zinc  oxide (DESITIN) 40 % ointment   Topical BID Malka Darrin, MD   Given at 01/16/24 2245   LORazepam  (ATIVAN ) injection 2 mg  2 mg Intravenous Q5 Min x 3 PRN Von Bellis, MD       ondansetron  (ZOFRAN ) injection 4 mg  4 mg Intravenous Q6H PRN Von Bellis, MD       Oral care mouth rinse  15 mL Mouth Rinse PRN Assaker, Darrin, MD       pantoprazole  (PROTONIX ) EC tablet 40 mg  40 mg Oral Daily Von Bellis, MD       polyethylene glycol (MIRALAX  / GLYCOLAX ) packet 17 g  17 g Per Tube Daily PRN Niels Kayla FALCON, RPH       saccharomyces boulardii  (FLORASTOR) capsule 250 mg  250 mg Per Tube BID Von Bellis, MD   250 mg at 01/16/24 2329   sertraline  (ZOLOFT ) tablet 25 mg  25 mg Per Tube Daily Von Bellis, MD   25 mg at 01/16/24 9071   vancomycin  (VANCOCIN ) 50 mg/mL oral solution SOLN 125 mg  125 mg Per Tube QID Von Bellis, MD   125 mg at 01/16/24 2329   white petrolatum  (VASELINE) gel   Topical BID Malka Darrin, MD   1 Application at 01/16/24 2329     Abtx:  Anti-infectives (From admission, onward)    Start     Dose/Rate Route Frequency Ordered Stop   01/16/24 1302  vancomycin  (VANCOCIN ) 50 mg/mL oral solution SOLN 125 mg        125 mg Per Tube 4 times daily 01/16/24 1304 01/26/24 1359   01/16/24 0330  cefTRIAXone  (ROCEPHIN ) 2 g in sodium chloride  0.9 % 100 mL IVPB        2 g 200 mL/hr over 30 Minutes Intravenous Every 24 hours 01/16/24 0233     01/15/24 2200  aztreonam  (AZACTAM ) 1 g in sodium chloride  0.9 % 100 mL IVPB  Status:  Discontinued        1 g 200 mL/hr over 30 Minutes Intravenous Every 8 hours 01/15/24 1534 01/16/24 0229   01/15/24 1630  aztreonam  (AZACTAM ) 1 g in sodium chloride  0.9 % 100 mL IVPB  Status:  Discontinued        1 g 200 mL/hr over 30 Minutes Intravenous Every 8 hours 01/15/24 1534 01/15/24 1534   01/15/24 1445  metroNIDAZOLE  (FLAGYL ) IVPB 500 mg  Status:  Discontinued        500 mg 100 mL/hr over 60 Minutes Intravenous Every 8 hours 01/15/24 1436 01/16/24 0232   01/15/24 1345  aztreonam  (AZACTAM ) 2 g in sodium chloride  0.9 % 100 mL IVPB        2 g 200 mL/hr over 30 Minutes Intravenous  Once 01/15/24 1332 01/15/24 1600   01/15/24 1345  vancomycin  (VANCOCIN ) IVPB 1000 mg/200 mL premix        1,000 mg 200 mL/hr over 60 Minutes Intravenous  Once 01/15/24 1332 01/15/24 1741       REVIEW OF SYSTEMS:  NA Objective:  VITALS:  BP 137/70 (BP Location: Left Arm)   Pulse 93   Temp 98.4 F (36.9 C)   Resp 20   Ht 5' 2 (1.575 m)   Wt 58.2 kg   SpO2 100%   BMI 23.47 kg/m  Right HD  catheter PHYSICAL EXAM:  General: Somnolent No response to  commands Head: Normocephalic, without obvious abnormality, atraumatic. Eyes: Conjunctivae clear, anicteric sclerae. Pupils are equal ENT and will to examine Neck: , Tracheostomy site is closed.  Symmetrical, no adenopathy, thyroid: non tender no carotid bruit and no JVD. Back: Stage IV sacral decubitus Picture reviewed   lungs: Bilateral air entry  heart: Regular rate and rhythm, no murmur, rub or gallop. Abdomen: Soft, lap scar PEG in place  extremities: atraumatic, no cyanosis. No edema. No clubbing Skin: No rashes or lesions. Or bruising Lymph: Cervical, supraclavicular normal. Neurologic: Cannot be assessed   Pertinent Labs Lab Results CBC    Component Value Date/Time   WBC 20.8 (H) 01/17/2024 0226   RBC 2.63 (L) 01/17/2024 0226   HGB 7.4 (L) 01/17/2024 0226   HGB 10.2 (L) 07/25/2020 1104   HCT 23.8 (L) 01/17/2024 0226   HCT 30.8 (L) 07/25/2020 1104   PLT 371 01/17/2024 0226   PLT 258 07/25/2020 1104   MCV 90.5 01/17/2024 0226   MCV 85 07/25/2020 1104   MCH 28.1 01/17/2024 0226   MCHC 31.1 01/17/2024 0226   RDW 16.5 (H) 01/17/2024 0226   RDW 16.4 (H) 07/25/2020 1104   LYMPHSABS 0.6 (L) 01/15/2024 1300   LYMPHSABS 1.9 11/08/2017 1430   MONOABS 0.5 01/15/2024 1300   EOSABS 0.0 01/15/2024 1300   EOSABS 0.2 11/08/2017 1430   BASOSABS 0.0 01/15/2024 1300   BASOSABS 0.0 11/08/2017 1430       Latest Ref Rng & Units 01/17/2024    2:26 AM 01/16/2024    4:19 AM 01/15/2024    1:00 PM  CMP  Glucose 70 - 99 mg/dL 80  895  851   BUN 8 - 23 mg/dL 63  45  33   Creatinine 0.44 - 1.00 mg/dL 6.21  6.85  6.89   Sodium 135 - 145 mmol/L 136  135  134   Potassium 3.5 - 5.1 mmol/L 3.8  3.7  3.7   Chloride 98 - 111 mmol/L 98  97  95   CO2 22 - 32 mmol/L 21  20  21    Calcium  8.9 - 10.3 mg/dL 8.5  8.2  8.3   Total Protein 6.5 - 8.1 g/dL 5.8   7.4   Total Bilirubin 0.0 - 1.2 mg/dL 0.9   0.7   Alkaline Phos 38 -  126 U/L 532    862   AST 15 - 41 U/L 67   89   ALT 0 - 44 U/L 23   26       Microbiology: Recent Results (from the past 240 hours)  Blood Culture (routine x 2)     Status: None (Preliminary result)   Collection Time: 01/15/24 12:42 PM   Specimen: BLOOD  Result Value Ref Range Status   Specimen Description BLOOD RIGHT ANTECUBITAL  Final   Special Requests   Final    BOTTLES DRAWN AEROBIC AND ANAEROBIC Blood Culture results may not be optimal due to an inadequate volume of blood received in culture bottles   Culture  Setup Time   Final    GRAM NEGATIVE RODS AEROBIC BOTTLE ONLY CRITICAL VALUE NOTED.  VALUE IS CONSISTENT WITH PREVIOUSLY REPORTED AND CALLED VALUE. Performed at [facility], [address] [place], [place] [address]    Culture GRAM NEGATIVE RODS  Final   Report Status PENDING  Incomplete  Blood Culture (routine x 2)     Status: None (Preliminary result)   Collection Time: 01/15/24 12:42 PM   Specimen: BLOOD  Result Value Ref Range Status   Specimen Description   Final    BLOOD LEFT ANTECUBITAL Performed at [facility], [address] [place], [place] [address]    Special Requests   Final    BOTTLES DRAWN AEROBIC AND ANAEROBIC Blood Culture results may not be optimal due to an inadequate volume of blood received in culture bottles Performed at [facility], [address] [place], [place] [address]    Culture  Setup Time   Final    GRAM NEGATIVE RODS AEROBIC BOTTLE ONLY CRITICAL RESULT CALLED TO, READ BACK BY AND VERIFIED WITH: JASON ROBBINS, PHARMD @0132  01/16/2024 COP Performed at [facility], [address] [place], [place] [address]    Culture GRAM NEGATIVE RODS  Final   Report Status PENDING  Incomplete  Blood Culture ID Panel (Reflexed)     Status: Abnormal   Collection Time: 01/15/24 12:42 PM  Result Value Ref Range Status   Enterococcus faecalis NOT DETECTED NOT DETECTED Final   Enterococcus Faecium NOT DETECTED NOT  DETECTED Final   Listeria monocytogenes NOT DETECTED NOT DETECTED Final   Staphylococcus species NOT DETECTED NOT DETECTED Final   Staphylococcus aureus (BCID) NOT DETECTED NOT DETECTED Final   Staphylococcus epidermidis NOT DETECTED NOT DETECTED Final   Staphylococcus lugdunensis NOT DETECTED NOT DETECTED Final   Streptococcus species NOT DETECTED NOT DETECTED Final   Streptococcus agalactiae NOT DETECTED NOT DETECTED Final   Streptococcus pneumoniae NOT DETECTED NOT DETECTED Final   Streptococcus pyogenes NOT DETECTED NOT DETECTED Final   A.calcoaceticus-baumannii NOT DETECTED NOT DETECTED Final   Bacteroides fragilis NOT DETECTED NOT DETECTED Final   Enterobacterales DETECTED (A) NOT DETECTED Final    Comment: Enterobacterales represent a large order of gram negative bacteria, not a single organism. CRITICAL RESULT CALLED TO, READ BACK BY AND VERIFIED WITH: JASON ROBBINS, PHARMD @0132  01/16/2024 COP    Enterobacter cloacae complex NOT DETECTED NOT DETECTED Final   Escherichia coli DETECTED (A) NOT DETECTED Final    Comment: CRITICAL RESULT CALLED TO, READ BACK BY AND VERIFIED WITH: JASON ROBBINS, PHARMD @0132  01/16/2024 COP    Klebsiella aerogenes NOT DETECTED NOT DETECTED Final   Klebsiella oxytoca NOT DETECTED NOT DETECTED Final   Klebsiella pneumoniae NOT DETECTED NOT DETECTED Final   Proteus species NOT DETECTED NOT  DETECTED Final   Salmonella species NOT DETECTED NOT DETECTED Final   Serratia marcescens NOT DETECTED NOT DETECTED Final   Haemophilus influenzae NOT DETECTED NOT DETECTED Final   Neisseria meningitidis NOT DETECTED NOT DETECTED Final   Pseudomonas aeruginosa NOT DETECTED NOT DETECTED Final   Stenotrophomonas maltophilia NOT DETECTED NOT DETECTED Final   Candida albicans NOT DETECTED NOT DETECTED Final   Candida auris NOT DETECTED NOT DETECTED Final   Candida glabrata NOT DETECTED NOT DETECTED Final   Candida krusei NOT DETECTED NOT DETECTED Final   Candida  parapsilosis NOT DETECTED NOT DETECTED Final   Candida tropicalis NOT DETECTED NOT DETECTED Final   Cryptococcus neoformans/gattii NOT DETECTED NOT DETECTED Final   CTX-M ESBL NOT DETECTED NOT DETECTED Final   Carbapenem resistance IMP NOT DETECTED NOT DETECTED Final   Carbapenem resistance KPC NOT DETECTED NOT DETECTED Final   Carbapenem resistance NDM NOT DETECTED NOT DETECTED Final   Carbapenem resist OXA 48 LIKE NOT DETECTED NOT DETECTED Final   Carbapenem resistance VIM NOT DETECTED NOT DETECTED Final    Comment: Performed at [facility], [address] [place], [place] [address]  MRSA Next Gen by PCR, Nasal     Status: None   Collection Time: 01/15/24  3:40 PM   Specimen: Nasal Mucosa; Nasal Swab  Result Value Ref Range Status   MRSA by PCR Next Gen NOT DETECTED NOT DETECTED Final    Comment: (NOTE) The GeneXpert MRSA Assay (FDA approved for NASAL specimens only), is one component of a comprehensive MRSA colonization surveillance program. It is not intended to diagnose MRSA infection nor to guide or monitor treatment for MRSA infections. Test performance is not FDA approved in patients less than 83 years old. Performed at [facility], [address] [place], [place] [address]   C Difficile Quick Screen w PCR reflex     Status: Abnormal   Collection Time: 01/16/24 10:32 AM   Specimen: Stool  Result Value Ref Range Status   C Diff antigen POSITIVE (A) NEGATIVE Final   C Diff toxin NEGATIVE NEGATIVE Final   C Diff interpretation Results are indeterminate. See PCR results.  Final    Comment: Performed at [facility], [address] [place], [place] [address]  Gastrointestinal Panel by PCR , Stool     Status: None   Collection Time: 01/16/24 10:32 AM   Specimen: Stool  Result Value Ref Range Status   Campylobacter species NOT DETECTED NOT DETECTED Final   Plesimonas shigelloides NOT DETECTED NOT DETECTED Final   Salmonella species NOT  DETECTED NOT DETECTED Final   Yersinia enterocolitica NOT DETECTED NOT DETECTED Final   Vibrio species NOT DETECTED NOT DETECTED Final   Vibrio cholerae NOT DETECTED NOT DETECTED Final   Enteroaggregative E coli (EAEC) NOT DETECTED NOT DETECTED Final   Enteropathogenic E coli (EPEC) NOT DETECTED NOT DETECTED Final   Enterotoxigenic E coli (ETEC) NOT DETECTED NOT DETECTED Final   Shiga like toxin producing E coli (STEC) NOT DETECTED NOT DETECTED Final   Shigella/Enteroinvasive E coli (EIEC) NOT DETECTED NOT DETECTED Final   Cryptosporidium NOT DETECTED NOT DETECTED Final   Cyclospora cayetanensis NOT DETECTED NOT DETECTED Final   Entamoeba histolytica NOT DETECTED NOT DETECTED Final   Giardia lamblia NOT DETECTED NOT DETECTED Final   Adenovirus F40/41 NOT DETECTED NOT DETECTED Final   Astrovirus NOT DETECTED NOT DETECTED Final   Norovirus GI/GII NOT DETECTED NOT DETECTED Final   Rotavirus A NOT DETECTED NOT DETECTED  Final   Sapovirus (I, II, IV, and V) NOT DETECTED NOT DETECTED Final    Comment: Performed at [facility], [address] [place], [place] [address]  C. Diff by PCR, Reflexed     Status: Abnormal   Collection Time: 01/16/24 10:32 AM  Result Value Ref Range Status   Toxigenic C. Difficile by PCR POSITIVE (A) NEGATIVE Final    Comment: Positive for toxigenic C. difficile with little to no toxin production. Only treat if clinical presentation suggests symptomatic illness. Performed at [facility], [address] [place], [place] [address]     IMAGING RESULTS:  I have personally reviewed the films Rt liver.  Dilated biliary ductules versus hepatic abscesses ? Impression/Recommendation Recurrent E. coli bacteremia with a history of obstruction biliary ductal system placement of stent in June 2025.  The current source for E. coli is likely the biliary system Ascending cholangitis a concern There is an element of dilated biliary ductules versus multiple  hepatic abscesses Discuss with IR to aspirate one of the collection and send it for culture Patient is currently on ceftriaxone  Will add Flagyl  IV we will have to treat the patient with at least 4 weeks   sacral decubitus with osteomyelitis.  Possible source of the bacteremia Difficult situation.  Antibiotics will not heal this.  Needs offloading, air mattress   C. difficile infection.  Antigen positive but toxin negative and PCR positive On p.o. vancomycin  Anemia  Altered mental status  End-stage renal disease on dialysis through a HD catheter on the right chest wall  History of small intestinal obstruction status post resection  Hypertension  History of seizures Discussed the management with the sister and the requesting provider This consult involved complex antimicrobial management  ________________________________________________ Note:  This document was prepared using Dragon voice recognition software and may include unintentional dictation errors.

## 2024-01-17 NOTE — H&P (View-Only) (Signed)
 We have tried to get the patient down this afternoon for PermCath removal.  Had endoscopy this afternoon and still not back in her room.  Will plan on PermCath removal tomorrow morning.

## 2024-01-17 NOTE — Progress Notes (Signed)
 PHARMACY CONSULT NOTE - FOLLOW UP  Pharmacy Consult for Electrolyte Monitoring and Replacement   Recent Labs: Potassium (mmol/L)  Date Value  01/17/2024 3.8   Magnesium  (mg/dL)  Date Value  92/92/7974 2.3   Calcium  (mg/dL)  Date Value  92/92/7974 8.5 (L)   Albumin (g/dL)  Date Value  92/92/7974 2.0 (L)  07/25/2020 4.2   Phosphorus (mg/dL)  Date Value  92/92/7974 5.3 (H)   Sodium (mmol/L)  Date Value  01/17/2024 136  07/25/2020 141     Assessment:  68 y.o.  female with end stage renal disease on hemodialysis, seizure disorders, congestive heart failure, depression, bipolar disorder, and breast cancer who is admitted to [facility] on 01/15/2024 for Resp distress. Pharmacy is asked to follow and replace electrolytes while in CCU   C.diff positive, on PO vancomycin . Last BM type 6.   Goal of Therapy:  Electrolytes WNL  Plan:  No replacement needed at this time. Pt transferred from the ICU. Will sign out. If needed, please re-consult pharmacy.   Cathaleen GORMAN Blanch ,PharmD Clinical Pharmacist 01/17/2024 7:38 AM

## 2024-01-17 NOTE — Progress Notes (Signed)
 [facility]  ROUNDING NOTE   Subjective:   [name] was admitted to [facility] on 01/15/2024 for Sepsis [facility]) [A41.9]  Patient received her complete hemodialysis treatment on day of admission which was recently lowered to 3 hours. Patient was premedicated with haloperidol , gabapentin  and oxycodone  before her treatment.   Update Patient seen earlier today,  Somnolent, arousable for short periods No family present  Objective:  Vital signs in last 24 hours:  Temp:  [97.8 F (36.6 C)-99.8 F (37.7 C)] 99.8 F (37.7 C) (07/07 1304) Pulse Rate:  [92-98] 98 (07/07 1304) Resp:  [15-20] 20 (07/07 0410) BP: (132-167)/(70-84) 151/77 (07/07 1304) SpO2:  [98 %-100 %] 100 % (07/07 1304) Weight:  [58.2 kg] 58.2 kg (07/07 0500)  Weight change: 3.9 kg Filed Weights   01/15/24 1203 01/15/24 1539 01/17/24 0500  Weight: 54.3 kg 53.6 kg 58.2 kg    Intake/Output: I/O last 3 completed shifts: In: 400 [IV Piggyback:400] Out: 0    Intake/Output this shift:  No intake/output data recorded.  Physical Exam: General: Ill appearing  Head: Normocephalic, atraumatic. Moist oral mucosal membranes  Eyes: Anicteric  Lungs:  Clear to auscultation, room air  Heart: Regular rate and rhythm  Abdomen:  Soft, nontender  Extremities:  no peripheral edema.  Neurologic: Somnolent.   Skin: No lesions  Access: RIJ permcath    Basic Metabolic Panel: Recent Labs  Lab 01/15/24 1300 01/16/24 0419 01/17/24 0226  NA 134* 135 136  K 3.7 3.7 3.8  CL 95* 97* 98  CO2 21* 20* 21*  GLUCOSE 148* 104* 80  BUN 33* 45* 63*  CREATININE 3.10* 3.14* 3.78*  CALCIUM  8.3* 8.2* 8.5*  MG  --  2.1 2.3  PHOS  --  4.7* 5.3*    Liver Function Tests: Recent Labs  Lab 01/15/24 1300 01/16/24 0419 01/17/24 0226  AST 89*  --  67*  ALT 26  --  23  ALKPHOS 862*  --  532*  BILITOT 0.7  --  0.9  PROT 7.4  --  5.8*  ALBUMIN 2.3* 2.1* 2.0*   Recent Labs  Lab 01/15/24 1300  LIPASE 32   No results  for input(s): AMMONIA in the last 168 hours.  CBC: Recent Labs  Lab 01/15/24 1300 01/16/24 0419 01/17/24 0226  WBC 21.6* 16.0* 20.8*  NEUTROABS 20.3*  --   --   HGB 9.1* 9.2* 7.4*  HCT 29.7* 29.2* 23.8*  MCV 93.4 89.8 90.5  PLT 434* 398 371    Cardiac Enzymes: No results for input(s): CKTOTAL, CKMB, CKMBINDEX, TROPONINI in the last 168 hours.  BNP: Invalid input(s): POCBNP  CBG: Recent Labs  Lab 01/15/24 1235 01/15/24 1539  GLUCAP 110* 115*    Microbiology: Results for orders placed or performed during the hospital encounter of 01/15/24  Blood Culture (routine x 2)     Status: None (Preliminary result)   Collection Time: 01/15/24 12:42 PM   Specimen: BLOOD  Result Value Ref Range Status   Specimen Description   Final    BLOOD RIGHT ANTECUBITAL Performed at [facility], [address], [place], [place] [address]    Special Requests   Final    BOTTLES DRAWN AEROBIC AND ANAEROBIC Blood Culture results may not be optimal due to an inadequate volume of blood received in culture bottles Performed at [facility], [address], [place], [place] [address]    Culture  Setup Time   Final    GRAM NEGATIVE RODS AEROBIC  BOTTLE ONLY CRITICAL VALUE NOTED.  VALUE IS CONSISTENT WITH PREVIOUSLY REPORTED AND CALLED VALUE. Performed at [facility], [address], [place], [place] [address]    Culture   Final    VONNE NEGATIVE RODS IDENTIFICATION TO FOLLOW Performed at [facility], [address], [place], [place] [address]    Report Status PENDING  Incomplete  Blood Culture (routine x 2)     Status: Abnormal (Preliminary result)   Collection Time: 01/15/24 12:42 PM   Specimen: BLOOD  Result Value Ref Range Status   Specimen Description   Final    BLOOD LEFT ANTECUBITAL Performed at [facility], [address] [place], [place] [address]    Special Requests   Final    BOTTLES DRAWN AEROBIC AND ANAEROBIC Blood  Culture results may not be optimal due to an inadequate volume of blood received in culture bottles Performed at [facility], [address], [place], [place] [address]    Culture  Setup Time   Final    GRAM NEGATIVE RODS AEROBIC BOTTLE ONLY CRITICAL RESULT CALLED TO, READ BACK BY AND VERIFIED WITH: JASON ROBBINS, PHARMD @0132  01/16/2024 COP    Culture (A)  Final    ESCHERICHIA COLI SUSCEPTIBILITIES TO FOLLOW Performed at [facility], [address], [place], [place] [address]    Report Status PENDING  Incomplete  Blood Culture ID Panel (Reflexed)     Status: Abnormal   Collection Time: 01/15/24 12:42 PM  Result Value Ref Range Status   Enterococcus faecalis NOT DETECTED NOT DETECTED Final   Enterococcus Faecium NOT DETECTED NOT DETECTED Final   Listeria monocytogenes NOT DETECTED NOT DETECTED Final   Staphylococcus species NOT DETECTED NOT DETECTED Final   Staphylococcus aureus (BCID) NOT DETECTED NOT DETECTED Final   Staphylococcus epidermidis NOT DETECTED NOT DETECTED Final   Staphylococcus lugdunensis NOT DETECTED NOT DETECTED Final   Streptococcus species NOT DETECTED NOT DETECTED Final   Streptococcus agalactiae NOT DETECTED NOT DETECTED Final   Streptococcus pneumoniae NOT DETECTED NOT DETECTED Final   Streptococcus pyogenes NOT DETECTED NOT DETECTED Final   A.calcoaceticus-baumannii NOT DETECTED NOT DETECTED Final   Bacteroides fragilis NOT DETECTED NOT DETECTED Final   Enterobacterales DETECTED (A) NOT DETECTED Final    Comment: Enterobacterales represent a large order of gram negative bacteria, not a single organism. CRITICAL RESULT CALLED TO, READ BACK BY AND VERIFIED WITH: JASON ROBBINS, PHARMD @0132  01/16/2024 COP    Enterobacter cloacae complex NOT DETECTED NOT DETECTED Final   Escherichia coli DETECTED (A) NOT DETECTED Final    Comment: CRITICAL RESULT CALLED TO, READ BACK BY AND VERIFIED WITH: JASON ROBBINS, PHARMD @0132  01/16/2024 COP     Klebsiella aerogenes NOT DETECTED NOT DETECTED Final   Klebsiella oxytoca NOT DETECTED NOT DETECTED Final   Klebsiella pneumoniae NOT DETECTED NOT DETECTED Final   Proteus species NOT DETECTED NOT DETECTED Final   Salmonella species NOT DETECTED NOT DETECTED Final   Serratia marcescens NOT DETECTED NOT DETECTED Final   Haemophilus influenzae NOT DETECTED NOT DETECTED Final   Neisseria meningitidis NOT DETECTED NOT DETECTED Final   Pseudomonas aeruginosa NOT DETECTED NOT DETECTED Final   Stenotrophomonas maltophilia NOT DETECTED NOT DETECTED Final   Candida albicans NOT DETECTED NOT DETECTED Final   Candida auris NOT DETECTED NOT DETECTED Final   Candida glabrata NOT DETECTED NOT DETECTED Final   Candida krusei NOT DETECTED NOT DETECTED Final   Candida parapsilosis NOT DETECTED NOT DETECTED Final  Candida tropicalis NOT DETECTED NOT DETECTED Final   Cryptococcus neoformans/gattii NOT DETECTED NOT DETECTED Final   CTX-M ESBL NOT DETECTED NOT DETECTED Final   Carbapenem resistance IMP NOT DETECTED NOT DETECTED Final   Carbapenem resistance KPC NOT DETECTED NOT DETECTED Final   Carbapenem resistance NDM NOT DETECTED NOT DETECTED Final   Carbapenem resist OXA 48 LIKE NOT DETECTED NOT DETECTED Final   Carbapenem resistance VIM NOT DETECTED NOT DETECTED Final    Comment: Performed at [facility], [address] [place], [place] [address]  MRSA Next Gen by PCR, Nasal     Status: None   Collection Time: 01/15/24  3:40 PM   Specimen: Nasal Mucosa; Nasal Swab  Result Value Ref Range Status   MRSA by PCR Next Gen NOT DETECTED NOT DETECTED Final    Comment: (NOTE) The GeneXpert MRSA Assay (FDA approved for NASAL specimens only), is one component of a comprehensive MRSA colonization surveillance program. It is not intended to diagnose MRSA infection nor to guide or monitor treatment for MRSA infections. Test performance is not FDA approved in patients less than 24  years old. Performed at [facility], [address] [place], [place] [address]   C Difficile Quick Screen w PCR reflex     Status: Abnormal   Collection Time: 01/16/24 10:32 AM   Specimen: Stool  Result Value Ref Range Status   C Diff antigen POSITIVE (A) NEGATIVE Final   C Diff toxin NEGATIVE NEGATIVE Final   C Diff interpretation Results are indeterminate. See PCR results.  Final    Comment: Performed at [facility], [address] [place], [place] [address]  Gastrointestinal Panel by PCR , Stool     Status: None   Collection Time: 01/16/24 10:32 AM   Specimen: Stool  Result Value Ref Range Status   Campylobacter species NOT DETECTED NOT DETECTED Final   Plesimonas shigelloides NOT DETECTED NOT DETECTED Final   Salmonella species NOT DETECTED NOT DETECTED Final   Yersinia enterocolitica NOT DETECTED NOT DETECTED Final   Vibrio species NOT DETECTED NOT DETECTED Final   Vibrio cholerae NOT DETECTED NOT DETECTED Final   Enteroaggregative E coli (EAEC) NOT DETECTED NOT DETECTED Final   Enteropathogenic E coli (EPEC) NOT DETECTED NOT DETECTED Final   Enterotoxigenic E coli (ETEC) NOT DETECTED NOT DETECTED Final   Shiga like toxin producing E coli (STEC) NOT DETECTED NOT DETECTED Final   Shigella/Enteroinvasive E coli (EIEC) NOT DETECTED NOT DETECTED Final   Cryptosporidium NOT DETECTED NOT DETECTED Final   Cyclospora cayetanensis NOT DETECTED NOT DETECTED Final   Entamoeba histolytica NOT DETECTED NOT DETECTED Final   Giardia lamblia NOT DETECTED NOT DETECTED Final   Adenovirus F40/41 NOT DETECTED NOT DETECTED Final   Astrovirus NOT DETECTED NOT DETECTED Final   Norovirus GI/GII NOT DETECTED NOT DETECTED Final   Rotavirus A NOT DETECTED NOT DETECTED Final   Sapovirus (I, II, IV, and V) NOT DETECTED NOT DETECTED Final    Comment: Performed at [facility], [address] [place], [place] [address]  C. Diff by PCR, Reflexed     Status: Abnormal    Collection Time: 01/16/24 10:32 AM  Result Value Ref Range Status   Toxigenic C. Difficile by PCR POSITIVE (A) NEGATIVE Final    Comment: Positive for toxigenic C. difficile with little to no toxin production. Only treat if clinical presentation suggests symptomatic illness. Performed at [facility], [address], [place], [place] [address]  Coagulation Studies: Recent Labs    01/15/24 1300  LABPROT 14.7  INR 1.1    Urinalysis: No results for input(s): COLORURINE, LABSPEC, PHURINE, GLUCOSEU, HGBUR, BILIRUBINUR, KETONESUR, PROTEINUR, UROBILINOGEN, NITRITE, LEUKOCYTESUR in the last 72 hours.  Invalid input(s): APPERANCEUR    Imaging: No results found.    Medications:    cefTRIAXone  (ROCEPHIN )  IV Stopped (01/17/24 0438)    ascorbic acid   500 mg Per Tube BID   carvedilol   6.25 mg Per Tube BID   Chlorhexidine  Gluconate Cloth  6 each Topical Daily   cloBAZam   5 mg Per Tube Q12H   [START ON 01/18/2024] collagenase    Topical Daily   fentaNYL  (SUBLIMAZE ) injection  25 mcg Intravenous Once   [START ON 01/18/2024] haloperidol   1 mg Per Tube Once per day on Tuesday Saturday   heparin  injection (subcutaneous)  5,000 Units Subcutaneous Q8H   lacosamide   200 mg Per Tube BID   levETIRAcetam   500 mg Per Tube BID   liver oil-zinc  oxide   Topical BID   pantoprazole   40 mg Oral Daily   saccharomyces boulardii  250 mg Per Tube BID   sertraline   25 mg Per Tube Daily   vancomycin   125 mg Per Tube QID   white petrolatum    Topical BID   acetaminophen , docusate, hydrALAZINE , HYDROmorphone  (DILAUDID ) injection, HYDROmorphone , hydrOXYzine , LORazepam , ondansetron  (ZOFRAN ) IV, mouth rinse, polyethylene glycol  Assessment/ Plan:  [name] is a 68 y.o.  female with end stage renal disease on hemodialysis, seizure disorders, congestive heart failure, depression, bipolar disorder, and breast cancer who is admitted to [facility] on 01/15/2024 for Sepsis [facility])  [A41.9]  [facility] RIJ permcath 56kg.   End Stage Renal Disease: no indication for dialysis.  -Vascular planning to remove HD permcath later today - will hold dialysis to allow  for line free holiday - Will monitor clinical presentation and labs.  Hypotension: with sepsis - appreciate ICU input.  - Carvedilol  and hydralazine  restarted today - broad spectrum antibiotics, ceftriaxone  and vancomycin .   Anemia with chronic kidney disease: on Mircera as outpatient.  Hemoglobin decreased, 7.4.  Will order ESA with dialysis.  Secondary Hyperparathyroidism: Sevelamer  held.  Calcium  and phosphorus within optimal range.   LOS: 2 Suzie Vandam 7/7/20253:34 PM

## 2024-01-18 ENCOUNTER — Encounter
Admission: EM | Disposition: A | Discharge status: Hospice | Payer: Self-pay | Source: Skilled Nursing Facility | Attending: Student

## 2024-01-18 ENCOUNTER — Telehealth (HOSPITAL_COMMUNITY): Payer: Self-pay | Admitting: Pharmacy Technician

## 2024-01-18 ENCOUNTER — Encounter: Payer: Self-pay | Admitting: Vascular Surgery

## 2024-01-18 ENCOUNTER — Inpatient Hospital Stay

## 2024-01-18 ENCOUNTER — Other Ambulatory Visit (HOSPITAL_COMMUNITY): Payer: Self-pay

## 2024-01-18 ENCOUNTER — Encounter (HOSPITAL_COMMUNITY): Payer: Self-pay

## 2024-01-18 DIAGNOSIS — N186 End stage renal disease: Secondary | ICD-10-CM | POA: Diagnosis not present

## 2024-01-18 DIAGNOSIS — G40901 Epilepsy, unspecified, not intractable, with status epilepticus: Secondary | ICD-10-CM

## 2024-01-18 DIAGNOSIS — R4182 Altered mental status, unspecified: Secondary | ICD-10-CM

## 2024-01-18 DIAGNOSIS — R569 Unspecified convulsions: Secondary | ICD-10-CM | POA: Diagnosis not present

## 2024-01-18 DIAGNOSIS — K831 Obstruction of bile duct: Secondary | ICD-10-CM | POA: Diagnosis not present

## 2024-01-18 DIAGNOSIS — K750 Abscess of liver: Secondary | ICD-10-CM | POA: Diagnosis not present

## 2024-01-18 DIAGNOSIS — G928 Other toxic encephalopathy: Secondary | ICD-10-CM

## 2024-01-18 DIAGNOSIS — Z452 Encounter for adjustment and management of vascular access device: Secondary | ICD-10-CM

## 2024-01-18 DIAGNOSIS — R6520 Severe sepsis without septic shock: Secondary | ICD-10-CM | POA: Diagnosis not present

## 2024-01-18 DIAGNOSIS — R7881 Bacteremia: Secondary | ICD-10-CM | POA: Diagnosis not present

## 2024-01-18 DIAGNOSIS — B9620 Unspecified Escherichia coli [E. coli] as the cause of diseases classified elsewhere: Secondary | ICD-10-CM | POA: Diagnosis not present

## 2024-01-18 DIAGNOSIS — N179 Acute kidney failure, unspecified: Secondary | ICD-10-CM | POA: Diagnosis not present

## 2024-01-18 DIAGNOSIS — A419 Sepsis, unspecified organism: Secondary | ICD-10-CM | POA: Diagnosis not present

## 2024-01-18 DIAGNOSIS — M869 Osteomyelitis, unspecified: Secondary | ICD-10-CM | POA: Diagnosis not present

## 2024-01-18 HISTORY — PX: DIALYSIS/PERMA CATHETER REMOVAL: CATH118289

## 2024-01-18 LAB — CBC
HCT: 23.5 % — ABNORMAL LOW (ref 36.0–46.0)
HCT: 24.4 % — ABNORMAL LOW (ref 36.0–46.0)
Hemoglobin: 7.2 g/dL — ABNORMAL LOW (ref 12.0–15.0)
Hemoglobin: 7.5 g/dL — ABNORMAL LOW (ref 12.0–15.0)
MCH: 28.2 pg (ref 26.0–34.0)
MCH: 28.8 pg (ref 26.0–34.0)
MCHC: 30.6 g/dL (ref 30.0–36.0)
MCHC: 30.7 g/dL (ref 30.0–36.0)
MCV: 92.2 fL (ref 80.0–100.0)
MCV: 93.8 fL (ref 80.0–100.0)
Platelets: 424 K/uL — ABNORMAL HIGH (ref 150–400)
Platelets: 366 K/uL (ref 150–400)
RBC: 2.55 MIL/uL — ABNORMAL LOW (ref 3.87–5.11)
RBC: 2.6 MIL/uL — ABNORMAL LOW (ref 3.87–5.11)
RDW: 16.9 % — ABNORMAL HIGH (ref 11.5–15.5)
RDW: 16.9 % — ABNORMAL HIGH (ref 11.5–15.5)
WBC: 20.8 K/uL — ABNORMAL HIGH (ref 4.0–10.5)
WBC: 16.5 K/uL — ABNORMAL HIGH (ref 4.0–10.5)
nRBC: 0 % (ref 0.0–0.2)
nRBC: 0 % (ref 0.0–0.2)

## 2024-01-18 LAB — CULTURE, BLOOD (ROUTINE X 2)

## 2024-01-18 LAB — GLUCOSE, CAPILLARY
Glucose-Capillary: 116 mg/dL — ABNORMAL HIGH (ref 70–99)
Glucose-Capillary: 113 mg/dL — ABNORMAL HIGH (ref 70–99)
Glucose-Capillary: 122 mg/dL — ABNORMAL HIGH (ref 70–99)
Glucose-Capillary: 120 mg/dL — ABNORMAL HIGH (ref 70–99)

## 2024-01-18 LAB — BASIC METABOLIC PANEL WITH GFR
Anion gap: 19 — ABNORMAL HIGH (ref 5–15)
BUN: 82 mg/dL — ABNORMAL HIGH (ref 8–23)
CO2: 21 mmol/L — ABNORMAL LOW (ref 22–32)
Calcium: 8.4 mg/dL — ABNORMAL LOW (ref 8.9–10.3)
Chloride: 96 mmol/L — ABNORMAL LOW (ref 98–111)
Creatinine, Ser: 4.06 mg/dL — ABNORMAL HIGH (ref 0.44–1.00)
GFR, Estimated: 11 mL/min — ABNORMAL LOW (ref >60)
Glucose, Bld: 119 mg/dL — ABNORMAL HIGH (ref 70–99)
Potassium: 3.6 mmol/L (ref 3.5–5.1)
Sodium: 136 mmol/L (ref 135–145)

## 2024-01-18 LAB — HEPATIC FUNCTION PANEL
ALT: 18 U/L (ref 0–44)
AST: 46 U/L — ABNORMAL HIGH (ref 15–41)
Albumin: 2 g/dL — ABNORMAL LOW (ref 3.5–5.0)
Alkaline Phosphatase: 489 U/L — ABNORMAL HIGH (ref 38–126)
Bilirubin, Direct: 0.1 mg/dL (ref 0.0–0.2)
Indirect Bilirubin: 0.6 mg/dL (ref 0.3–0.9)
Total Bilirubin: 0.7 mg/dL (ref 0.0–1.2)
Total Protein: 6.3 g/dL — ABNORMAL LOW (ref 6.5–8.1)

## 2024-01-18 LAB — PHOSPHORUS: Phosphorus: 4.8 mg/dL — ABNORMAL HIGH (ref 2.5–4.6)

## 2024-01-18 LAB — MAGNESIUM: Magnesium: 2.4 mg/dL (ref 1.7–2.4)

## 2024-01-18 SURGERY — DIALYSIS/PERMA CATHETER REMOVAL
Anesthesia: LOCAL

## 2024-01-18 MED ORDER — LIDOCAINE HCL (PF) 1 % IJ SOLN
5.0000 mL | Freq: Once | INTRAMUSCULAR | Status: DC
  Filled 2024-01-18: qty 5

## 2024-01-18 MED ORDER — METRONIDAZOLE 500 MG/100ML IV SOLN
500.0000 mg | Freq: Two times a day (BID) | INTRAVENOUS | Status: DC
  Administered 2024-01-18 – 2024-01-20 (×4): 500 mg via INTRAVENOUS
  Filled 2024-01-18 (×4): qty 100

## 2024-01-18 MED ORDER — PANTOPRAZOLE SODIUM 40 MG IV SOLR
40.0000 mg | Freq: Every day | INTRAVENOUS | Status: DC
  Administered 2024-01-18 – 2024-01-20 (×3): 40 mg via INTRAVENOUS
  Filled 2024-01-18 (×3): qty 10

## 2024-01-18 MED ORDER — LACOSAMIDE 10 MG/ML PO SOLN
200.0000 mg | Freq: Two times a day (BID) | ORAL | Status: DC
  Administered 2024-01-18 – 2024-01-20 (×6): 200 mg
  Filled 2024-01-18 (×7): qty 20

## 2024-01-18 MED ORDER — LIDOCAINE-EPINEPHRINE (PF) 1 %-1:200000 IJ SOLN
INTRAMUSCULAR | Status: DC | PRN
  Administered 2024-01-18: 10 mL via INTRADERMAL

## 2024-01-18 MED ORDER — SODIUM CHLORIDE 0.9 % IV BOLUS
1000.0000 mL | Freq: Once | INTRAVENOUS | Status: AC
  Administered 2024-01-18: 1000 mL via INTRAVENOUS

## 2024-01-18 MED ORDER — SODIUM CHLORIDE 0.9 % IV BOLUS
500.0000 mL | Freq: Once | INTRAVENOUS | Status: AC
  Administered 2024-01-18: 500 mL via INTRAVENOUS

## 2024-01-18 MED ORDER — SODIUM CHLORIDE 0.9 % IV SOLN: INTRAVENOUS | Status: AC

## 2024-01-18 MED ORDER — HEPARIN SODIUM (PORCINE) 5000 UNIT/ML IJ SOLN
5000.0000 [IU] | Freq: Three times a day (TID) | INTRAMUSCULAR | Status: DC
  Administered 2024-01-18 – 2024-01-20 (×5): 5000 [IU] via SUBCUTANEOUS
  Filled 2024-01-18 (×6): qty 1

## 2024-01-18 MED ORDER — MIDODRINE HCL 5 MG PO TABS
10.0000 mg | ORAL_TABLET | Freq: Three times a day (TID) | ORAL | Status: DC
  Administered 2024-01-18 – 2024-01-20 (×5): 10 mg
  Filled 2024-01-18 (×6): qty 2

## 2024-01-18 SURGICAL SUPPLY — 1 items: TRAY LACERATION DISP STRL (SET/KITS/TRAYS/PACK) IMPLANT

## 2024-01-18 NOTE — Procedures (Signed)
 Interventional Radiology Procedure Note  Procedure: U/S guided aspiration liver abscess  Complications: None  Estimated Blood Loss: < 10 mL  Findings: Two aspirations of two separte right lobe liver fluid collections.  Samples obtained and sent to Pathology.  Sherry DELENA Banner, MD

## 2024-01-18 NOTE — Progress Notes (Signed)
 [facility] Progress Note  Patient: Sherry [name]    FMW:[id number]  DOA: 01/15/2024     Date of Service: the patient was seen and examined on 01/18/2024  Chief Complaint  Patient presents with   Altered Mental Status   Brief hospital course: Sherry [name] is a 68 year old female patient with a past medical history of end-stage renal disease on HD MWF via right tunneled cath, seizure disorder, chronic hepatitis C, opioid use disorder, stroke, status post gastrostomy tube, hypertension, diastolic congestive heart failure, depression with anxiety, bipolar, breast cancer status post right mastectomy presenting to [facility] on 07/05 for confusion and altered mental status and hypotension.   She received dialysis earlier today and was reportedly described as being somnolent throughout the dialysis session.  She went back to her rehab and was found to be confused and unresponsive by the team and therefore EMS were called.  On arrival EMS she was found to have systolics in the 80s and therefore she was brought in to [facility] ED.   Of note, she was recently admitted to [facility] on 06/04 to 06/13 with E. coli bacteremia and signs of cholangitis.  She underwent ERCP on 06/06 and was noted to have choledocholithiasis, sphincterotomy was done and a plastic stent was placed.   In the ED, she was found to be hypotensive with MAP low 60s, she received 1 L normal saline bolus with good response to fluids.  She was started on broad-spectrum antibiotics with vancomycin  and aztreonam  and Flagyl .   Labs: Lactate 6.1 WBC 21.6 with 94% neutrophils, hemoglobin 9.1 and hematocrit 29.7, platelets 434. Electrolyte with CO2 21, BUN 33 and creatinine 3.1.  Potassium 3.7.  Chest x-ray without any acute process.  01/15/2024 admitted to the ICU.  Patient was given IV fluid, blood pressure stable, did not require pressor support.  Downgraded under [facility] service on 01/16/2024  Assessment and Plan:  # Metabolic encephalopathy due to  sepsis Continue supportive care, fall precautions, aspiration precaution Continue to treat as below   # Sepsis, multifactorial could be due to C. difficile and E. coli bacteremia, decub osteomyelitis and liver abscess H/o recent instrumentation with ERCP sphincterotomy and stent placement (12/17/2023)  Seen by GI recommended no need of repeat ERCP, may repeat MRI to look at the liver lesion. S/p vancomycin  and cefepime  Blood culture growing E. coli, continue ceftriaxone  Stool positive for C. difficile, started vancomycin  enteric via G-tube Vital signs stable, no need of vasopressors Transferred to progressive unit Follow ID for further recommendation  # C. difficile gastroenteritis 7/6 started  vancomycin  125 mg p.o. Q6 hourly for 10 days Started probiotics   # Liver abscess GI consulted, recommended MRI versus CT scan with contrast IR consulted, s/p Two aspirations of two separte right lobe liver fluid collections.  Samples obtained and sent to Pathology. Done on 7/8, follow fluid culture report Follow ID consult   # Decub osteomyelitis General Surgery consulted, recommended no intervention, less chances of healing Turn patient every 2 hours Follow ID for antibiotics   # ESRD on hemodialysis, MWF schedule Right tunneled catheter need to be removed due to E. coli bacteremia Nephrology consulted vascular surgery to remove removed tunneled HD catheter, which was removed on 7/8 Follow nephro for further recommendation 7/8 Follow catheter tip culture   # Seizure disorder Continue Keppra  500 twice daily clobazam  5 mg twice daily Vimpat  200 mg twice daily EEG: generalized periodic discharges with triphasic morphology which can be on the ictal-interictal continuum. could also be secondary  to toxic-metabolic causes. Additionally there is severe diffuse encephalopathy. No definite seizures were noted.  7/8 d/w neurology, recommended continue current treatment, will repeat EEG and  brain imaging tomorrow a.m. Overall poor prognosis, recommended palliative care.  # Depression and bipolar disorder Resumed Haldol  and Zoloft  home dose  # HTN, diastolic CHF Continue to monitor BP and titrate medications accordingly Presented with sepsis, we will continue to monitor closely   # History of chronic hep C Follow-up with GI as an outpatient # History of breast cancer, no active issues, follow-up with oncology as an outpatient  # Iron deficiency, Tsat 4% Patient will benefit from IV iron infusion, cannot be given now due to acute infection.  Patient can be evaluated as an outpatient for IV iron transfusion Start oral iron supplement on discharge with vitamin C    Body mass index is 22.3 kg/m.  Interventions:   Diet: G-tube feeding DVT Prophylaxis: Subcutaneous Heparin     Advance goals of care discussion: DNR/DNI-Limited  Family Communication: family was present at bedside, at the time of interview.  Patient remained encephalopathic, unable to communicate. 7/8 goals of care discussed with family, management plan discussed in details. Family agreed to change CODE STATUS to DNR/DNI.   Disposition:  Pt is from SNF, admitted with sepsis, still on IV antibiotic, which precludes a safe discharge. Discharge to SNF, when stable, may need few days to improve.  Subjective: No significant events overnight, patient remained encephalopathic, sometimes she is responding with most of the time she is sleepy.  During my exam patient remained lethargic and encephalopathic, opens eyes briefly and today she is making some moaning sounds.  It seems a little bit better than yesterday.   Physical Exam: General: NAD, lying comfortably, remains encephalopathic Eyes: closed ENT: Oral Mucosa dry   Neck: no JVD,  Cardiovascular: S1 and S2 Present, no Murmur,  Respiratory: Equal air entry bilaterally, no Crackles, no wheezes Abdomen: Bowel Sound present, Soft and no tenderness,   Extremities: no Pedal edema, no calf tenderness Neurologic: without any new focal findings Gait not checked due to patient safety concerns  Vitals:   01/18/24 1100 01/18/24 1116 01/18/24 1141 01/18/24 1200  BP:  121/67 130/69   Pulse: 80 80 82   Resp:  20  17  Temp:   99.7 F (37.6 C)   TempSrc:      SpO2: 96% 96% 100%   Weight:      Height:        Intake/Output Summary (Last 24 hours) at 01/18/2024 1510 Last data filed at 01/18/2024 9388 Gross per 24 hour  Intake 717.39 ml  Output --  Net 717.39 ml   Filed Weights   01/15/24 1539 01/17/24 0500 01/18/24 0500  Weight: 53.6 kg 58.2 kg 55.3 kg    Data Reviewed: I have personally reviewed and interpreted daily labs, tele strips, imagings as discussed above. I reviewed all nursing notes, pharmacy notes, vitals, pertinent old records I have discussed plan of care as described above with RN and patient/family.  CBC: Recent Labs  Lab 01/15/24 1300 01/16/24 0419 01/17/24 0226 01/18/24 0405 01/18/24 1249  WBC 21.6* 16.0* 20.8* 20.8* 16.5*  NEUTROABS 20.3*  --   --   --   --   HGB 9.1* 9.2* 7.4* 7.2* 7.5*  HCT 29.7* 29.2* 23.8* 23.5* 24.4*  MCV 93.4 89.8 90.5 92.2 93.8  PLT 434* 398 371 424* 366   Basic Metabolic Panel: Recent Labs  Lab 01/15/24 1300 01/16/24 0419 01/17/24 0226 01/18/24  0405  NA 134* 135 136 136  K 3.7 3.7 3.8 3.6  CL 95* 97* 98 96*  CO2 21* 20* 21* 21*  GLUCOSE 148* 104* 80 119*  BUN 33* 45* 63* 82*  CREATININE 3.10* 3.14* 3.78* 4.06*  CALCIUM  8.3* 8.2* 8.5* 8.4*  MG  --  2.1 2.3 2.4  PHOS  --  4.7* 5.3* 4.8*    Studies: US  FINE NEEDLE ASP 1ST LESION Result Date: 01/18/2024 Jenna Cordella LABOR, MD     01/18/2024 11:44 AM Interventional Radiology Procedure Note Procedure: U/S guided aspiration liver abscess Complications: None Estimated Blood Loss: < 10 mL Findings: Two aspirations of two separte right lobe liver fluid collections.  Samples obtained and sent to Pathology. Cordella LABOR Jenna, MD    PERIPHERAL VASCULAR CATHETERIZATION Result Date: 01/18/2024 See surgical note for result.  EEG adult Result Date: 01/18/2024 Shelton Arlin KIDD, MD     01/18/2024  8:39 AM Patient Name: Janett Kamath MRN: [id number] Epilepsy Attending: Arlin KIDD Shelton Referring Physician/Provider: Von Bellis, MD Date: 01/17/2024 Duration: 26.40 mins Patient history: 68yo F with ams. EEG to evaluate for seizure Level of alertness: comatose AEDs during EEG study: LEV, LCM, Onfi  Technical aspects: This EEG study was done with scalp electrodes positioned according to the 10-20 International system of electrode placement. Electrical activity was reviewed with band pass filter of 1-70Hz , sensitivity of 7 uV/mm, display speed of 40mm/sec with a 60Hz  notched filter applied as appropriate. EEG data were recorded continuously and digitally stored.  Video monitoring was available and reviewed as appropriate. Description: EEG showed near continuous generalized 2 to 3 Hz delta slowing admixed with 0.5 to 1 seconds of generalized EEG attenuation.  Generalized periodic discharges with triphasic morphology were also noted at 0.5 to 1.5 Hz. Hyperventilation and photic stimulation were not performed.   ABNORMALITY - Periodic discharges with triphasic morphology, generalized -Continuous slow, generalized -Background condition, generalized IMPRESSION: This study showed generalized periodic discharges with triphasic morphology which can be on the ictal-interictal continuum.  However given the morphology and frequency, could also be secondary to toxic-metabolic causes.  Additionally there is severe diffuse encephalopathy.  No definite seizures were noted. If concern for ictal-interictal activity persist, please consider long-term EEG. Priyanka O Yadav     Scheduled Meds:  ascorbic acid   500 mg Per Tube BID   carvedilol   6.25 mg Per Tube BID   Chlorhexidine  Gluconate Cloth  6 each Topical Daily   cloBAZam   5 mg Per Tube Q12H   collagenase     Topical Daily   feeding supplement (PROSource TF20)  60 mL Per Tube Daily   fentaNYL  (SUBLIMAZE ) injection  25 mcg Intravenous Once   [START ON 01/22/2024] haloperidol   1 mg Per Tube Once per day on Tuesday Saturday   heparin  injection (subcutaneous)  5,000 Units Subcutaneous Q8H   lacosamide   200 mg Per Tube BID   levETIRAcetam   500 mg Per Tube BID   lidocaine  (PF)  5 mL Subcutaneous Once   liver oil-zinc  oxide   Topical BID   multivitamin  1 tablet Per Tube QHS   nutrition supplement (JUVEN)  1 packet Per Tube BID BM   pantoprazole  (PROTONIX ) IV  40 mg Intravenous QHS   saccharomyces boulardii  250 mg Per Tube BID   sertraline   25 mg Per Tube Daily   thiamine   100 mg Per Tube Daily   vancomycin   125 mg Per Tube QID   white petrolatum    Topical BID   zinc  sulfate (  50mg  elemental zinc )  220 mg Per Tube Daily   Continuous Infusions:  cefTRIAXone  (ROCEPHIN )  IV Stopped (01/18/24 0424)   feeding supplement (NEPRO CARB STEADY) 237 mL (01/18/24 0611)   PRN Meds: acetaminophen , docusate, hydrALAZINE , HYDROmorphone  (DILAUDID ) injection, HYDROmorphone , hydrOXYzine , LORazepam , ondansetron  (ZOFRAN ) IV, mouth rinse, polyethylene glycol  Time spent: 55 minutes  Author: ELVAN SOR. MD [facility] 01/18/2024 3:10 PM  To reach On-call, see care teams to locate the attending and reach out to them via [email or web address]. If 7PM-7AM, please contact night-coverage If you still have difficulty reaching the attending provider, please page the [facility] (Director on Call) for [facility] on amion for assistance.

## 2024-01-18 NOTE — Consult Note (Signed)
 MEDICATION-RELATED CONSULT NOTE   IR Procedure Consult - Anticoagulant/Antiplatelet PTA/Inpatient Med List Review by Pharmacist    Procedure: U/S guided aspiration liver abscess     Completed: 7/8 1135  Post-Procedural bleeding risk per IR MD assessment:  standard  Antithrombotic medications on inpatient or PTA profile prior to procedure:   Heparin  sq    Recommended restart time per IR Post-Procedure Guidelines:  > 6 hours post procedure.         Plan:    Will restart heparin  sq at 2200. > 6 hours post procedure.

## 2024-01-18 NOTE — Interval H&P Note (Signed)
 History and Physical Interval Note:  01/18/2024 8:50 AM  Sherry [name]  has presented today for surgery, with the diagnosis of End stage renal disease.  The various methods of treatment have been discussed with the patient and family. After consideration of risks, benefits and other options for treatment, the patient has consented to  Procedure(s): DIALYSIS/PERMA CATHETER REMOVAL (N/A) as a surgical intervention.  The patient's history has been reviewed, patient examined, no change in status, stable for surgery.  I have reviewed the patient's chart and labs.  Questions were answered to the patient's satisfaction.     [name]

## 2024-01-18 NOTE — Consult Note (Signed)
 NEUROLOGY CONSULT NOTE   Date of service: January 18, 2024 Patient Name: Sherry [name] MRN:  [id number] DOB:  05-09-1956 Chief Complaint: AMS, abnormal EEG Requesting Provider: Von Bellis, MD  History of Present Illness  Sherry [name] is a 68 y.o. female with hx of ESRD on HD, hypertension with past history of tardive dyskinesia, remote history of substance abuse, hypertensive urgency, prolonged hospitalizations in the past few months including that at [facility] where she also had electrographic status epilepticus-and started on Keppra , Vimpat  and Onfi , then again admitted to [facility] 12/15/2023 to 12/24/2023 EEG E. coli bacteremia and cholangitis with ERCP and stent placement, during all the hospitalizations in such continued to remain extremely encephalopathic with some waxing and waning improvement in mentation, admitted now to [facility] hospital since 01/15/2024 worsening encephalopathy and unresponsiveness at her rehab facility.  Her encephalopathy was deemed to be secondary to her multifactorial sepsis-C. difficile colitis, E. coli bacteremia, decubitus osteomyelitis and liver abscess for which antibiotics and other modalities are being used.  The patient's pertinent epilepsy history is that of nonconvulsive status epilepticus noted at the [facility] hospital.  Imaging was concerning for posterior reversible encephalopathy syndrome versus autoimmune encephalitis (GFAP-AE) for which she was treated with steroids.  She also had an LP with negative meningitis panel and diagnosis was preemptively more supportive of PR ES more than meningitis or CNS inflammatory process.  Vimpat , Keppra  and Onfi  were used as antiepileptics.  Antiepileptics were discontinued during this hospitalization and restarted yesterday.  An EEG was obtained. Neurology was consulted for abnormal EEG findings of periodic discharges with triphasic morphology that was generalized.  Imaging in the  form of MRI brain at the outside hospital also showed a small volume subarachnoid hemorrhage and extensive T2/flair changes concerning for press versus meningitis versus vasculitis.  The sister was at bedside-who is the [facility], reports that she has been going downhill since the hospitalization started in late January.  There have been a few time.  Where she is able to say some words but for the past month or so has not really been very communicative.  MRI not performed during this admission.  CT head was performed-no acute intracranial abnormality   ROS  Unable to perform due to patient's mentation  Past History   Past Medical History:  Diagnosis Date   Alcohol  use disorder, severe, dependence (HCC) 08/27/2017   Anxiety    Body mass index (BMI) 40.0-44.9, adult (HCC) 07/19/2020   Breast cancer (HCC)    right lumpectemy and lymph node    Cervical spondylosis 05/24/2020   Chronic bilateral low back pain with right-sided sciatica 05/24/2020   Chronic diastolic CHF (congestive heart failure) (HCC) 05/09/2019   Cigarette smoker 12/31/2017   ESRD (end stage renal disease) on dialysis (HCC) 08/22/2021   M-W-F   Essential hypertension 07/06/2017   GERD (gastroesophageal reflux disease)    Hepatitis C virus infection cured after antiviral drug therapy 12/17/2012   Telephone Encounter - Mercie Planas, RN - 12/28/2016 9:54 AM EDT Patient called for HCV RNA test results. EOT 08-26-16 - not detected. 12 week post treatment 12-11-16 - not detected. Informed patient that she was cured. Patient verbalized understanding.          Hx of bipolar disorder 01/31/2016   Impaired functional mobility, balance, gait, and endurance 01/09/2021   MRSA infection    of breast incision   Neuroleptic-induced tardive dyskinesia 09/06/2017   Abilify   Opioid use disorder, severe,  dependence (HCC) 08/27/2017   Restless legs syndrome 02/13/2013   Formatting of this note might be different from the original. IMPRESSION:  Possible. Will follow.   Subacute dyskinesia due to drug 02/13/2013   Formatting of this note might be different from the original. STORY: Tardive dyskinesia and mild limb dyskinesia, LE>UE which likely due to prolonged antipsychotic used, abilify. Couldn't tolerate artane, gabapentin , clonazepam and xenazine.`E1o3L`IMPRESSION: Well tolerated depakote  250mg  bid, mood aspect is better as well. Will increase to 500mg  bid. ADR was discussed.  RTC 4-6 weeks.   TIA (transient ischemic attack) 2020   P/w BP >230/120 and neurologic symptoms, presumed TIA    Past Surgical History:  Procedure Laterality Date   A/V FISTULAGRAM Right 01/29/2022   Procedure: A/V Fistulagram;  Surgeon: Gretta Lonni PARAS, MD;  Location: [facility] INVASIVE CV LAB;  Service: Cardiovascular;  Laterality: Right;   AV FISTULA PLACEMENT Right 05/01/2021   Procedure: RIGHT ARTERIOVENOUS (AV) FISTULA CREATION;  Surgeon: Lanis Fonda BRAVO, MD;  Location: [facility] OR;  Service: Vascular;  Laterality: Right;  PERIPHERAL NERVE BLOCK   BACK SURGERY  1990   BASCILIC VEIN TRANSPOSITION Right 09/02/2021   Procedure: RIGHT SECOND STAGE BASILIC VEIN TRANSPOSITION;  Surgeon: Lanis Fonda BRAVO, MD;  Location: [facility] OR;  Service: Vascular;  Laterality: Right;  PERIPHERAL NERVE BLOCK   BILIARY STENT PLACEMENT  12/17/2023   Procedure: INSERTION, STENT, BILE DUCT;  Surgeon: Jinny Carmine, MD;  Location: [facility] ENDOSCOPY;  Service: Endoscopy;;   BREAST SURGERY Right 2010   breast cancer survivor - states partial mastectomy and nodes   COLONOSCOPY     [facility]   DIALYSIS/PERMA CATHETER REMOVAL N/A 01/18/2024   Procedure: DIALYSIS/PERMA CATHETER REMOVAL;  Surgeon: Marea Selinda RAMAN, MD;  Location: [facility] INVASIVE CV LAB;  Service: Cardiovascular;  Laterality: N/A;   ERCP N/A 12/17/2023   Procedure: ERCP, WITH INTERVENTION IF INDICATED;  Surgeon: Jinny Carmine, MD;  Location: [facility] ENDOSCOPY;  Service: Endoscopy;  Laterality: N/A;   ESOPHAGOGASTRODUODENOSCOPY      [facility]   ESOPHAGOGASTRODUODENOSCOPY  04/18/2020   [facility]   LAPAROSCOPIC CHOLECYSTECTOMY  2002   LAPAROSCOPIC INCISIONAL / UMBILICAL / VENTRAL HERNIA REPAIR  04/13/2018   with BARD 15x 20cm mesh (supraumbilical)   LAPAROSCOPIC LYSIS OF ADHESIONS  07/12/2018   Procedure: LAPAROSCOPIC LYSIS OF ADHESIONS;  Surgeon: Anitra Freddy NOVAK, MD;  Location: [facility] ORS;  Service: Gynecology;;   MULTIPLE TOOTH EXTRACTIONS     MYOMECTOMY     x 2 prior to hysterectomy   PERIPHERAL VASCULAR BALLOON ANGIOPLASTY Right 01/29/2022   Procedure: PERIPHERAL VASCULAR BALLOON ANGIOPLASTY;  Surgeon: Gretta Lonni PARAS, MD;  Location: [facility] INVASIVE CV LAB;  Service: Cardiovascular;  Laterality: Right;  arm fistula   ROBOTIC ASSISTED BILATERAL SALPINGO OOPHERECTOMY Right 07/12/2018   Procedure: XI ROBOTIC ASSISTED RIGHT SALPINGO OOPHORECTOMY;  Surgeon: Anitra Freddy NOVAK, MD;  Location: [facility] ORS;  Service: Gynecology;  Laterality: Right;   STONE EXTRACTION WITH BASKET  12/17/2023   Procedure: ERCP, WITH LITHROTRIPSY OR REMOVAL OF COMMON BILE DUCT CALCULUS USING BASKET;  Surgeon: Jinny Carmine, MD;  Location: [facility] ENDOSCOPY;  Service: Endoscopy;;   TOTAL ABDOMINAL HYSTERECTOMY     fibroids    UPPER GASTROINTESTINAL ENDOSCOPY     WISDOM TOOTH EXTRACTION      Family History: Family History  Problem Relation Age of Onset   Heart attack Mother    Breast cancer Mother 83   Dementia Mother    Cancer Father 34  died of bleeding from kidneys   Colon cancer Neg Hx    Esophageal cancer Neg Hx    Stomach cancer Neg Hx    Rectal cancer Neg Hx     Social History  reports that she has been smoking cigarettes. She has never been exposed to tobacco smoke. She has never used smokeless tobacco. She reports that she does not currently use alcohol . She reports that she does not currently use drugs.  Allergies  Allergen Reactions   Abilify [Aripiprazole] Other (See Comments)    Tardive dyskinesia Oral   Remeron   [Mirtazapine ] Other (See Comments)    Wgt stimulation /gain, Dizziness, Patient says can tolerate   Trazodone  And Nefazodone Other (See Comments)    Nightmares/sleep diturbance   Augmentin [Amoxicillin -Pot Clavulanate]    Flexeril [Cyclobenzaprine] Other (See Comments)    Pt states Flexeril makes her feel depressed    Amoxicillin  Diarrhea    Medications   Current Facility-Administered Medications:    acetaminophen  (TYLENOL ) tablet 650 mg, 650 mg, Per Tube, Q6H PRN, Marea, Selinda RAMAN, MD   ascorbic acid  (VITAMIN C ) tablet 500 mg, 500 mg, Per Tube, BID, Dew, Jason S, MD, 500 mg at 01/18/24 1146   carvedilol  (COREG ) tablet 6.25 mg, 6.25 mg, Per Tube, BID, Dew, Jason S, MD, 6.25 mg at 01/18/24 1147   cefTRIAXone  (ROCEPHIN ) 2 g in sodium chloride  0.9 % 100 mL IVPB, 2 g, Intravenous, Q24H, Dew, Selinda RAMAN, MD, Stopped at 01/18/24 0424   Chlorhexidine  Gluconate Cloth 2 % PADS 6 each, 6 each, Topical, Daily, Marea Selinda RAMAN, MD, 6 each at 01/17/24 0925   cloBAZam  (ONFI ) 2.5 MG/ML oral suspension 5 mg, 5 mg, Per Tube, Q12H, Marea Selinda RAMAN, MD, 5 mg at 01/18/24 1241   collagenase  (SANTYL ) ointment, , Topical, Daily, Dew, Selinda RAMAN, MD   docusate (COLACE) 50 MG/5ML liquid 100 mg, 100 mg, Per Tube, BID PRN, Marea, Selinda RAMAN, MD   feeding supplement (NEPRO CARB STEADY) liquid 237 mL, 237 mL, Per Tube, Continuous, Dew, Selinda RAMAN, MD, Last Rate: 40 mL/hr at 01/18/24 0611, 237 mL at 01/18/24 0611   feeding supplement (PROSource TF20) liquid 60 mL, 60 mL, Per Tube, Daily, Dew, Selinda RAMAN, MD, 60 mL at 01/17/24 1825   fentaNYL  (SUBLIMAZE ) injection 25 mcg, 25 mcg, Intravenous, Once, Marea Selinda RAMAN, MD   [START ON 01/22/2024] haloperidol  (HALDOL ) tablet 1 mg, 1 mg, Per Tube, Once per day on Tuesday Saturday, Dew, Jason S, MD   heparin  injection 5,000 Units, 5,000 Units, Subcutaneous, Q8H, Tobie Kishan S, RPH   hydrALAZINE  (APRESOLINE ) tablet 50 mg, 50 mg, Per Tube, Q6H PRN, Marea, Selinda RAMAN, MD   HYDROmorphone  (DILAUDID ) injection 1  mg, 1 mg, Intravenous, Q4H PRN, Marea Selinda RAMAN, MD, 1 mg at 01/16/24 9068   HYDROmorphone  (DILAUDID ) tablet 2 mg, 2 mg, Per Tube, Q6H PRN, Marea Selinda RAMAN, MD   hydrOXYzine  (ATARAX ) tablet 25 mg, 25 mg, Per Tube, TID PRN, Marea Selinda RAMAN, MD   lacosamide  (VIMPAT ) oral solution 200 mg, 200 mg, Per Tube, BID, Niels Kayla FALCON, RPH, 200 mg at 01/18/24 1242   levETIRAcetam  (KEPPRA ) 100 MG/ML solution 500 mg, 500 mg, Per Tube, BID, Dew, Jason S, MD, 500 mg at 01/18/24 1144   lidocaine  (PF) (XYLOCAINE ) 1 % injection 5 mL, 5 mL, Subcutaneous, Once, Jenna Cordella LABOR, MD   liver oil-zinc  oxide (DESITIN) 40 % ointment, , Topical, BID, Dew, Selinda RAMAN, MD, Given at 01/17/24 2145   LORazepam  (ATIVAN ) injection 2  mg, 2 mg, Intravenous, Q5 Min x 3 PRN, Dew, Jason S, MD   multivitamin (RENA-VIT) tablet 1 tablet, 1 tablet, Per Tube, QHS, Dew, Jason S, MD, 1 tablet at 01/17/24 2143   nutrition supplement (JUVEN) (JUVEN) powder packet 1 packet, 1 packet, Per Tube, BID BM, Dew, Selinda RAMAN, MD   ondansetron  (ZOFRAN ) injection 4 mg, 4 mg, Intravenous, Q6H PRN, Dew, Jason S, MD   Oral care mouth rinse, 15 mL, Mouth Rinse, PRN, Marea, Selinda RAMAN, MD   pantoprazole  (PROTONIX ) injection 40 mg, 40 mg, Intravenous, QHS, Von Bellis, MD   polyethylene glycol (MIRALAX  / GLYCOLAX ) packet 17 g, 17 g, Per Tube, Daily PRN, Marea, Selinda RAMAN, MD   saccharomyces boulardii (FLORASTOR) capsule 250 mg, 250 mg, Per Tube, BID, Dew, Jason S, MD, 250 mg at 01/18/24 1144   sertraline  (ZOLOFT ) tablet 25 mg, 25 mg, Per Tube, Daily, Dew, Selinda RAMAN, MD, 25 mg at 01/17/24 9085   thiamine  (VITAMIN B1) tablet 100 mg, 100 mg, Per Tube, Daily, Dew, Selinda RAMAN, MD, 100 mg at 01/17/24 1825   vancomycin  (VANCOCIN ) 50 mg/mL oral solution SOLN 125 mg, 125 mg, Per Tube, QID, Marea Selinda RAMAN, MD, 125 mg at 01/18/24 1242   white petrolatum  (VASELINE) gel, , Topical, BID, Marea Selinda RAMAN, MD, 1 Application at 01/17/24 2145   zinc  sulfate (50mg  elemental zinc ) capsule 220 mg, 220 mg,  Per Tube, Daily, Marea Selinda RAMAN, MD, 220 mg at 01/18/24 1146  Vitals   Vitals:   01/18/24 1100 01/18/24 1116 01/18/24 1141 01/18/24 1200  BP:  121/67 130/69   Pulse: 80 80 82   Resp:  20  17  Temp:   99.7 F (37.6 C)   TempSrc:      SpO2: 96% 96% 100%   Weight:      Height:        Body mass index is 22.3 kg/m.   Physical Exam  General: Lethargic, to noxious stimulation only opens eyes.  Does not track. HEENT: Normocephalic atraumatic Lungs: Clear Neurological exam Lethargic, to noxious stimulation opens eyes.  Does not follow commands. Appears to have a right lower facial droop.  Also has neck turned towards the right.  Has mild rightward gaze preference.  Does not blink to threat from either side. No withdrawal to noxious stimulation in any of the 4 extremities. Physical therapists assisted her to sit-no control over the neck when she sits.  Labs/Imaging/Neurodiagnostic studies   CBC:  Recent Labs  Lab 02-02-24 1300 01/16/24 0419 01/18/24 0405 01/18/24 1249  WBC 21.6*   < > 20.8* 16.5*  NEUTROABS 20.3*  --   --   --   HGB 9.1*   < > 7.2* 7.5*  HCT 29.7*   < > 23.5* 24.4*  MCV 93.4   < > 92.2 93.8  PLT 434*   < > 424* 366   < > = values in this interval not displayed.   Basic Metabolic Panel:  Lab Results  Component Value Date   NA 136 01/18/2024   K 3.6 01/18/2024   CO2 21 (L) 01/18/2024   GLUCOSE 119 (H) 01/18/2024   BUN 82 (H) 01/18/2024   CREATININE 4.06 (H) 01/18/2024   CALCIUM  8.4 (L) 01/18/2024   GFRNONAA 11 (L) 01/18/2024   GFRAA 18 (L) 07/25/2020   Lipid Panel:  Lab Results  Component Value Date   LDLCALC 123 (H) 04/04/2020   HgbA1c:  Lab Results  Component Value Date   HGBA1C 5.4 05/09/2019  Urine Drug Screen:     Component Value Date/Time   LABOPIA NONE DETECTED 12/15/2023 1740   LABOPIA NONE DETECTED 05/25/2023 0504   COCAINSCRNUR NONE DETECTED 12/15/2023 1740   LABBENZ POSITIVE (A) 12/15/2023 1740   LABBENZ POSITIVE (A)  05/25/2023 0504   AMPHETMU NONE DETECTED 12/15/2023 1740   AMPHETMU NONE DETECTED 05/25/2023 0504   THCU NONE DETECTED 12/15/2023 1740   THCU NONE DETECTED 05/25/2023 0504   LABBARB NONE DETECTED 12/15/2023 1740   LABBARB NONE DETECTED 05/25/2023 0504    Alcohol  Level     Component Value Date/Time   ETH <10 05/24/2023 1540   INR  Lab Results  Component Value Date   INR 1.1 01/15/2024   APTT  Lab Results  Component Value Date   APTT 40 (H) 12/15/2023    CT Head without contrast(Personally reviewed): No acute intracranial pathology  Routine EEG: Study showed generalized periodic discharges with triphasic morphology which can be on the ictal interictal continuum however given morphology and frequency could also be secondary to toxic metabolic causes.  Additionally there is severe diffuse encephalopathy.  No seizures reported.  If concern for ictal interictal activity persist, consider long-term EEG.  ASSESSMENT   Sherry [name] is a 68 y.o. female with a complicated past medical history and very complicated course with sepsis, nonconvulsive status epilepticus and severe encephalopathy over the past few months, who continues to have a very encephalopathic looking clinical exam and an abnormal EEG for which neurological consultation was obtained. Her EEG has triphasics which likely are related to the metabolic encephalopathy but she has had a very long course of nonconvulsive status epilepticus seen on long-term EEG at the outside hospital through February and March, which also could point to this EEG abnormality being on the ictal interictal continuum. That said, her overall clinical picture looks extremely poor-she is extremely malnourished and has failure to thrive along with sepsis from multiple etiologies including sacral decubitus ulcers, C. difficile colitis and E. coli bacteremia. The fact that her mental status has not improved in the past few weeks to months worries me that she  might have sustained significant damage to her brain from hypertensive urgency/emergency or PR ES or the presumed autoimmune encephalitis that she was treated for that recovery from a neurologically meaningful perspective may not be very likely. That said, I would reimage her brain and repeat a EEG tomorrow and provide further recommendations.  Impression: Toxic metabolic encephalopathy due to sepsis, evaluate for underlying nonconvulsive seizures or status epilepticus   RECOMMENDATIONS  Continue current antiepileptics-Keppra , Vimpat , Onfi . Maintain seizure precautions Repeat routine EEG tomorrow MRI brain without contrast Management of sepsis and metabolic derangements per primary team as you are As said above, the fact that she has had multiple months without much improvement in her mentation argues against chances of meaningful neurological recovery going forward irrespective of what treatment options and modalities may be used.  Palliative care consultation should be considered Plan discussed with Dr. Von ______________________________________________________________________    Signed, Eligio Lav, MD [facility]

## 2024-01-18 NOTE — Progress Notes (Signed)
 Patient clinical status remains unchanged from pre procedure/unresponsive, s/p liver aspiration per Dr Jenna under local only. Patient unresponsive pre and post procedure. Post procedure,transported back to 237 with report/update given to care nurse at bedside.

## 2024-01-18 NOTE — Procedures (Addendum)
 Patient Name: Sherry [name]  MRN: [id number]  Epilepsy Attending: Arlin MALVA Krebs  Referring Physician/Provider: Von Bellis, MD  Date: 01/17/2024 Duration: 26.40 mins  Patient history: 68yo F with ams. EEG to evaluate for seizure  Level of alertness: comatose  AEDs during EEG study: LEV, LCM, Onfi   Technical aspects: This EEG study was done with scalp electrodes positioned according to the 10-20 International system of electrode placement. Electrical activity was reviewed with band pass filter of 1-70Hz , sensitivity of 7 uV/mm, display speed of 57mm/sec with a 60Hz  notched filter applied as appropriate. EEG data were recorded continuously and digitally stored.  Video monitoring was available and reviewed as appropriate.  Description: EEG showed near continuous generalized 2 to 3 Hz delta slowing admixed with 0.5 to 1 seconds of generalized EEG attenuation.  Generalized periodic discharges with triphasic morphology were also noted at 0.5 to 1.5 Hz. Hyperventilation and photic stimulation were not performed.     ABNORMALITY - Periodic discharges with triphasic morphology, generalized -Continuous slow, generalized -Background condition, generalized   IMPRESSION: This study showed generalized periodic discharges with triphasic morphology which can be on the ictal-interictal continuum.  However given the morphology and frequency, could also be secondary to toxic-metabolic causes.  Additionally there is severe diffuse encephalopathy.  No definite seizures were noted.  If concern for ictal-interictal activity persist, please consider long-term EEG.   Conchetta Lamia O Zareah Hunzeker

## 2024-01-18 NOTE — Progress Notes (Signed)
 Occupational Therapy Treatment Patient Details Name: Sherry [name] MRN: [id number] DOB: Apr 01, 1956 Today's Date: 01/18/2024   History of present illness Patient is a 68 year old female found to be unresponsive and hypotensive upon return to facility from dialysis. PMH: ESRD on HD,  seizure disorder, chronic hepatitis C, opioid use disorder, stroke, status post gastrostomy tube, hypertension, diastolic congestive heart failure, depression with anxiety, bipolar, breast cancer status post right mastectomy   OT comments  Pt seen for OT/PT treatment on this date. Upon arrival to room pt semi supine in bed with sister at her beside conversing with neurologist. Pt was unresponsive throughout session, able to open her eyes but in able to track. Pt often not blinking for long bouts of time. Attempted to arouse pt with sternal rubs, noxious stimuli and cool wash cloth. Pt required totalA + 2 for all bed mobility and sitting up on the side of the bed. TotalA for bed level pericare. RN in room to assess sacral large wound.  Pt making limited progress toward goals, will continue to follow POC. Discharge recommendation remains appropriate.        If plan is discharge home, recommend the following:  Two people to help with walking and/or transfers;Two people to help with bathing/dressing/bathroom;Direct supervision/assist for medications management;Supervision due to cognitive status;Direct supervision/assist for financial management;Assist for transportation;Assistance with cooking/housework;Assistance with feeding;Help with stairs or ramp for entrance   Equipment Recommendations  Other (comment)    Recommendations for Other Services      Precautions / Restrictions Precautions Precautions: Fall Recall of Precautions/Restrictions: Impaired Precaution/Restrictions Comments: sacral decubitus Restrictions Weight Bearing Restrictions Per Provider Order: No       Mobility Bed Mobility Overal bed mobility:  Needs Assistance Bed Mobility: Rolling, Supine to Sit, Sit to Supine Rolling: Total assist, +2 for physical assistance   Supine to sit: Total assist, +2 for physical assistance Sit to supine: Total assist, +2 for physical assistance   General bed mobility comments: No active participation with bed mobility efforts, pt unable to hold head up once sitting on the EOB.    Transfers                   General transfer comment: Unsafe to attempt on this date     Balance Overall balance assessment: Needs assistance Sitting-balance support: No upper extremity supported, Feet supported Sitting balance-Leahy Scale: Zero Sitting balance - Comments: Total A to remain sitting up on the side of the bed.                                   ADL either performed or assessed with clinical judgement   ADL Overall ADL's : Needs assistance/impaired Eating/Feeding:  (Feeding tube)   Grooming: Wash/dry face;Bed level;Total assistance               Lower Body Dressing: Total assistance;Bed level Lower Body Dressing Details (indicate cue type and reason): Doffing pt's briefs during pericare     Toileting- Clothing Manipulation and Hygiene: Total assistance;Bed level;+2 for physical assistance         General ADL Comments: Pt remains unable to participate in ADL tasks due to level of arousal.    Extremity/Trunk Assessment              Vision       Perception     Praxis     Communication Communication Communication: Impaired Factors Affecting  Communication: Difficulty expressing self   Cognition Arousal: Stuporous Behavior During Therapy: Flat affect Cognition: Difficult to assess Difficult to assess due to: Level of arousal           OT - Cognition Comments: Pt rarely makes groaning sounds, and opens eyes briefly and then closes.                 Following commands: Impaired Following commands impaired:  (Unable to follow any comands)       Cueing   Cueing Techniques: Verbal cues, Tactile cues  Exercises Exercises: Other exercises Other Exercises Other Exercises: Edu Family: positioning benefits, arousal attempts, therapy trial    Shoulder Instructions       General Comments RN in room to repack sacral wound.    Pertinent Vitals/ Pain       Pain Assessment Pain Assessment: Faces Faces Pain Scale: No hurt  Home Living                                          Prior Functioning/Environment              Frequency  Min 1X/week        Progress Toward Goals  OT Goals(current goals can now be found in the care plan section)     Acute Rehab OT Goals OT Goal Formulation: With family Time For Goal Achievement: 01/30/24 Potential to Achieve Goals: Poor  Plan      Co-evaluation    PT/OT/SLP Co-Evaluation/Treatment: Yes Reason for Co-Treatment: Complexity of the patient's impairments (multi-system involvement);For patient/therapist safety PT goals addressed during session: Mobility/safety with mobility OT goals addressed during session: ADL's and self-care      AM-PAC OT 6 Clicks Daily Activity     Outcome Measure   Help from another person eating meals?: Total Help from another person taking care of personal grooming?: Total Help from another person toileting, which includes using toliet, bedpan, or urinal?: Total Help from another person bathing (including washing, rinsing, drying)?: Total Help from another person to put on and taking off regular upper body clothing?: Total Help from another person to put on and taking off regular lower body clothing?: Total 6 Click Score: 6    End of Session    OT Visit Diagnosis: Other abnormalities of gait and mobility (R26.89);Muscle weakness (generalized) (M62.81)   Activity Tolerance Treatment limited secondary to medical complications (Comment) (Level of arousal)   Patient Left in bed;with call bell/phone within reach;with bed  alarm set;with family/visitor present   Nurse Communication Other (comment) (Sacral wound and sunctioning needs)        Time: 8653-8582 OT Time Calculation (min): 31 min  Charges: OT General Charges $OT Visit: 1 Visit OT Treatments $Self Care/Home Management : 8-22 mins  Larraine Colas M.S. OTR/L  01/18/24, 3:10 PM

## 2024-01-18 NOTE — Progress Notes (Signed)
 Date of Admission:  01/15/2024     ID: Sherry [name] is a 68 y.o. female  Principal Problem:   Sepsis (HCC) Active Problems:   Osteomyelitis (HCC)   Pressure injury of skin   Protein-calorie malnutrition, severe  Sherry [name] is a 68 y.o. female complicated with history and recurrent hospitalizations in the past 12 months presents from [facility] with altered mental status on 01/15/2024. She has a history of end-stage renal disease on hemodialysis, sacral decubitus with osteomyelitis, history of ascending cholangitis with multiple liver abscesses, E. coli bacteremia in June 2025, CVA, seizure, hypertension, was recently in hospital between December 18, 2023 until December 24, 2023 and was seen by infectious disease for E. coli bacteremia and was treated with IV Unasyn for 2 weeks.  An MRCP done during that time showed  dilated biliary radicles of the right side of the liver.  The bile ducts had an appearance of string of pearl with multiple areas of focal narrowing concerning for cholangitis.  There was CBD dilatation.  She underwent ERCP and plastic stent placement.  At that time patient also had Enterococcus in the sacral wound which was resistant to Vanco but susceptible to ampicillin Patient was discharged to [facility] health care during last hospitalization Prior to this hospitalization she was at [facility] in the month of February for hypertension, PRES, seizures , AMS and during that hospitalization had tracheostomy as well as PEG.  LP done that time was N, HEPC RNA neg The tracheostomy site is closed.  Patient continues to have PEG  Subjective: Pt remains obtunded  Medications:   ascorbic acid   500 mg Per Tube BID   carvedilol   6.25 mg Per Tube BID   Chlorhexidine  Gluconate Cloth  6 each Topical Daily   cloBAZam   5 mg Per Tube Q12H   collagenase    Topical Daily   feeding supplement (PROSource TF20)  60 mL Per Tube Daily   fentaNYL  (SUBLIMAZE ) injection  25 mcg Intravenous Once   [START  ON 01/22/2024] haloperidol   1 mg Per Tube Once per day on Tuesday Saturday   heparin  injection (subcutaneous)  5,000 Units Subcutaneous Q8H   lacosamide   200 mg Per Tube BID   levETIRAcetam   500 mg Per Tube BID   lidocaine  (PF)  5 mL Subcutaneous Once   liver oil-zinc  oxide   Topical BID   multivitamin  1 tablet Per Tube QHS   nutrition supplement (JUVEN)  1 packet Per Tube BID BM   pantoprazole  (PROTONIX ) IV  40 mg Intravenous QHS   saccharomyces boulardii  250 mg Per Tube BID   sertraline   25 mg Per Tube Daily   thiamine   100 mg Per Tube Daily   vancomycin   125 mg Per Tube QID   white petrolatum    Topical BID   zinc  sulfate (50mg  elemental zinc )  220 mg Per Tube Daily    Objective: Vital signs in last 24 hours: Patient Vitals for the past 24 hrs:  BP Temp Temp src Pulse Resp SpO2 Weight  01/18/24 1200 -- -- -- -- 17 -- --  01/18/24 1141 130/69 99.7 F (37.6 C) -- 82 -- 100 % --  01/18/24 1116 121/67 -- -- 80 20 96 % --  01/18/24 1100 -- -- -- 80 -- 96 % --  01/18/24 1047 110/77 -- -- 82 20 97 % --  01/18/24 1021 110/77 (!) 97 F (36.1 C) Temporal 81 -- 94 % --  01/18/24 1000 -- -- -- 80 -- 95 % --  01/18/24 0911 118/61 99.3 F (37.4 C) Axillary 79 -- 95 % --  01/18/24 0900 -- -- -- 79 -- 94 % --  01/18/24 0853 112/67 -- -- 80 16 94 % --  01/18/24 0747 109/71 100 F (37.8 C) Axillary -- 16 92 % --  01/18/24 0746 -- 100 F (37.8 C) Axillary -- -- -- --  01/18/24 0500 -- -- -- -- -- -- 55.3 kg  01/18/24 0323 102/61 98.4 F (36.9 C) -- 86 17 92 % --  01/17/24 2348 116/64 99.1 F (37.3 C) Oral 84 19 100 % --  01/17/24 2020 126/79 98.8 F (37.1 C) Oral 90 18 100 % --  01/17/24 1600 128/69 99.6 F (37.6 C) -- 86 14 100 % --      PHYSICAL EXAM:  General: obtunded Back: stage iv sacral decubitus Lungs: b/l air entry Heart: Regular rate and rhythm, no murmur, rub or gallop. Abdomen: Soft, peg  Extremities: atraumatic, no cyanosis. No edema. No clubbing Skin: No rashes  or lesions. Or bruising Lymph: Cervical, supraclavicular normal. Neurologic: cannot assess  Lab Results    Latest Ref Rng & Units 01/18/2024   12:49 PM 01/18/2024    4:05 AM 01/17/2024    2:26 AM  CBC  WBC 4.0 - 10.5 K/uL 16.5  20.8  20.8   Hemoglobin 12.0 - 15.0 g/dL 7.5  7.2  7.4   Hematocrit 36.0 - 46.0 % 24.4  23.5  23.8   Platelets 150 - 400 K/uL 366  424  371        Latest Ref Rng & Units 01/18/2024    4:05 AM 01/17/2024    2:26 AM 01/16/2024    4:19 AM  CMP  Glucose 70 - 99 mg/dL 880  80  895   BUN 8 - 23 mg/dL 82  63  45   Creatinine 0.44 - 1.00 mg/dL 5.93  6.21  6.85   Sodium 135 - 145 mmol/L 136  136  135   Potassium 3.5 - 5.1 mmol/L 3.6  3.8  3.7   Chloride 98 - 111 mmol/L 96  98  97   CO2 22 - 32 mmol/L 21  21  20    Calcium  8.9 - 10.3 mg/dL 8.4  8.5  8.2   Total Protein 6.5 - 8.1 g/dL 6.3  5.8    Total Bilirubin 0.0 - 1.2 mg/dL 0.7  0.9    Alkaline Phos 38 - 126 U/L 489  532    AST 15 - 41 U/L 46  67    ALT 0 - 44 U/L 18  23        Microbiology:  Studies/Results: US  FINE NEEDLE ASP 1ST LESION Result Date: 01/18/2024 Jenna Cordella LABOR, MD     01/18/2024 11:44 AM Interventional Radiology Procedure Note Procedure: U/S guided aspiration liver abscess Complications: None Estimated Blood Loss: < 10 mL Findings: Two aspirations of two separte right lobe liver fluid collections.  Samples obtained and sent to Pathology. Cordella LABOR Jenna, MD   PERIPHERAL VASCULAR CATHETERIZATION Result Date: 01/18/2024 See surgical note for result.  EEG adult Result Date: 01/18/2024 Shelton Arlin KIDD, MD     01/18/2024  8:39 AM Patient Name: Sherry [name] MRN: [id number] Epilepsy Attending: Arlin KIDD Shelton Referring Physician/Provider: Von Bellis, MD Date: 01/17/2024 Duration: 26.40 mins Patient history: 68yo F with ams. EEG to evaluate for seizure Level of alertness: comatose AEDs during EEG study: LEV, LCM, Onfi  Technical aspects: This EEG study was done with scalp electrodes  positioned according  to the 10-20 International system of electrode placement. Electrical activity was reviewed with band pass filter of 1-70Hz , sensitivity of 7 uV/mm, display speed of 72mm/sec with a 60Hz  notched filter applied as appropriate. EEG data were recorded continuously and digitally stored.  Video monitoring was available and reviewed as appropriate. Description: EEG showed near continuous generalized 2 to 3 Hz delta slowing admixed with 0.5 to 1 seconds of generalized EEG attenuation.  Generalized periodic discharges with triphasic morphology were also noted at 0.5 to 1.5 Hz. Hyperventilation and photic stimulation were not performed.   ABNORMALITY - Periodic discharges with triphasic morphology, generalized -Continuous slow, generalized -Background condition, generalized IMPRESSION: This study showed generalized periodic discharges with triphasic morphology which can be on the ictal-interictal continuum.  However given the morphology and frequency, could also be secondary to toxic-metabolic causes.  Additionally there is severe diffuse encephalopathy.  No definite seizures were noted. If concern for ictal-interictal activity persist, please consider long-term EEG. Priyanka MALVA Krebs     Assessment/Plan: Recurrent E. coli bacteremia with a history of obstruction of biliary ductal system , placement of stent in June 2025.  The current source for E. coli is likely the biliary system Ascending cholangitis a concern There is an element of dilated biliary ductules versus multiple hepatic abscesses IR aspirated 2 pockets in the liver today and sent for culture Patient is currently on ceftriaxone  we will have to treat the patient with at least 4 weeks    sacral decubitus with osteomyelitis.   Difficult situation.  Antibiotics will not heal this.  Needs offloading, air mattress    C. difficile infection.  Antigen positive but toxin negative and PCR positive On p.o. vancomycin   Anemia   Altered mental status    End-stage renal disease on dialysis through a HD catheter on the right chest wall   History of small intestinal obstruction status post resection   Hypertension   History of seizures  Discussed the management with the care team

## 2024-01-18 NOTE — Progress Notes (Signed)
 Physical Therapy Treatment Patient Details Name: Sherry [name] MRN: [id number] DOB: 07-Mar-1956 Today's Date: 01/18/2024   History of Present Illness Patient is a 68 year old female found to be unresponsive and hypotensive upon return to facility from dialysis. PMH: ESRD on HD,  seizure disorder, chronic hepatitis C, opioid use disorder, stroke, status post gastrostomy tube, hypertension, diastolic congestive heart failure, depression with anxiety, bipolar, breast cancer status post right mastectomy    PT Comments  Patient largely unresponsive throughout session despite wakening techniques, and noxious or repetitive stimuli. Did open eyes for about half of the session after some of these techniques, but did not vocalize (positively or negatively) during session, unable to visually track. totalAx2 for all mobility including coming to sit EOB. Family present and pt unresponsive to their presence/voice as well. Noted for BM, pericare totalAx2 and RN in room to address wound care management at end of session. PT to re-attempt as able pending pt's ability to participate with therapy services.    If plan is discharge home, recommend the following: Two people to help with walking and/or transfers;Two people to help with bathing/dressing/bathroom;Assistance with cooking/housework;Supervision due to cognitive status;Help with stairs or ramp for entrance;Assistance with feeding;Direct supervision/assist for medications management;Direct supervision/assist for financial management;Assist for transportation   Can travel by private vehicle     No  Equipment Recommendations  Other (comment) (TBD)    Recommendations for Other Services       Precautions / Restrictions Precautions Precautions: Fall Recall of Precautions/Restrictions: Impaired Precaution/Restrictions Comments: sacral decubitus Restrictions Weight Bearing Restrictions Per Provider Order: No     Mobility  Bed Mobility Overal bed mobility:  Needs Assistance Bed Mobility: Rolling, Supine to Sit, Sit to Supine Rolling: Total assist, +2 for physical assistance   Supine to sit: Total assist, +2 for physical assistance Sit to supine: +2 for physical assistance, Total assist        Transfers                   General transfer comment: unable    Ambulation/Gait               General Gait Details: unable   Stairs             Wheelchair Mobility     Tilt Bed    Modified Rankin (Stroke Patients Only)       Balance Overall balance assessment: Needs assistance Sitting-balance support: Feet supported Sitting balance-Leahy Scale: Zero                                      Communication Communication Communication: Impaired Factors Affecting Communication: Difficulty expressing self  Cognition Arousal: Stuporous Behavior During Therapy: Flat affect   PT - Cognitive impairments: History of cognitive impairments                       PT - Cognition Comments: patient is unable to follow any commands during this visit Following commands: Impaired      Cueing Cueing Techniques: Verbal cues, Tactile cues, Gestural cues, Visual cues  Exercises      General Comments        Pertinent Vitals/Pain Pain Assessment Pain Assessment: Faces Faces Pain Scale: No hurt    Home Living  Prior Function            PT Goals (current goals can now be found in the care plan section) Progress towards PT goals:  (limited by lethargy, unable to participate/follow commands)    Frequency    Min 1X/week      PT Plan      Co-evaluation PT/OT/SLP Co-Evaluation/Treatment: Yes Reason for Co-Treatment: Complexity of the patient's impairments (multi-system involvement);For patient/therapist safety PT goals addressed during session: Mobility/safety with mobility OT goals addressed during session: ADL's and self-care      AM-PAC PT 6  Clicks Mobility   Outcome Measure  Help needed turning from your back to your side while in a flat bed without using bedrails?: Total Help needed moving from lying on your back to sitting on the side of a flat bed without using bedrails?: Total Help needed moving to and from a bed to a chair (including a wheelchair)?: Total Help needed standing up from a chair using your arms (e.g., wheelchair or bedside chair)?: Total Help needed to walk in hospital room?: Total Help needed climbing 3-5 steps with a railing? : Total 6 Click Score: 6    End of Session   Activity Tolerance: Patient limited by lethargy Patient left: in bed;with call bell/phone within reach;with family/visitor present   PT Visit Diagnosis: Muscle weakness (generalized) (M62.81);Unsteadiness on feet (R26.81)     Time: 8653-8585 PT Time Calculation (min) (ACUTE ONLY): 28 min  Charges:    $Therapeutic Activity: 8-22 mins PT General Charges $$ ACUTE PT VISIT: 1 Visit                    Doyal Shams PT, DPT 3:06 PM,01/18/24

## 2024-01-18 NOTE — Telephone Encounter (Signed)
 Patient Product/process development scientist completed.    The patient is insured through [facility]. Patient has Medicare and is not eligible for a copay card, but may be able to apply for patient assistance or Medicare RX Payment Plan (Patient Must reach out to their plan, if eligible for payment plan), if available.    Ran test claim for vancomycin  125 mg and the current 14 day co-pay is $0.00.   This test claim was processed through [facility]- copay amounts may vary at other pharmacies due to pharmacy/plan contracts, or as the patient moves through the different stages of their insurance plan.     Reyes Sharps, CPHT Pharmacy Technician III Certified Patient Advocate [facility] Patient Advocate Team Direct Number: [phone]  Fax: [phone]

## 2024-01-18 NOTE — Plan of Care (Signed)

## 2024-01-18 NOTE — Op Note (Signed)
 Operative Note     Preoperative diagnosis:   1. ESRD with bacteremia  Postoperative diagnosis:  1. ESRD with bacteremia  Procedure:  Removal of right jugular Permcath  Surgeon:  Selinda Gu, MD  Anesthesia:  Local  EBL:  Minimal  Indication for the Procedure:  The patient has renal failure with bacteremia.  This needs to be removed.  Risks and benefits are discussed and informed consent is obtained.  Description of the Procedure:  The patient's right neck, chest and existing catheter were sterilely prepped and draped. The area around the catheter was anesthetized copiously with 1% lidocaine . The catheter was dissected out with curved hemostats until the cuff was freed from the surrounding fibrous sheath. The fiber sheath was transected, and the catheter was then removed in its entirety using gentle traction. Pressure was held and sterile dressings were placed. The patient tolerated the procedure well and was taken to the recovery room in stable condition.     Selinda Gu  01/18/2024, 8:50 AM This note was created with Dragon Medical transcription system. Any errors in dictation are purely unintentional.

## 2024-01-18 NOTE — Progress Notes (Signed)
                                                     Palliative Care Progress Note, Assessment & Plan   Patient Name: Sherry [name]       Date: 01/18/2024 DOB: Oct 23, 1955  Age: 68 y.o. MRN#: [id number] Attending Physician: Von Bellis, MD Primary Care Physician: Caleen Dirks, MD Admit Date: 01/15/2024  Subjective: Patient is lying in bed on her left side, receiving a bath from nurse tech.  Family members at bedside during my visit.  HPI: 68 y.o. female  with past medical history significant for ESRD on HD, seizure disorder, chroninc hepatitis C, opioid use disorder, CVA, s/p gastrostomy tube, HTN, dCHF, depression with anxiety, bipolar, breast cancer s/p R mastectomy. She presented to [facility] ED from [facility] 01/15/2024 with c/o AMS and hypotension. It was reported that patient had received dialysis PTA and was somnolent throughout dialysis session.    ED workup significant for lactic 6.1, WBC 21.6, Hgb 9.1, Hct 29.7, plts 434, creatinine 3.1, BUN 33 and K+ 3.7. CXR negative.  BP 81/71, HR 100, RR 16, SpO2 100% RA, 98.6.    Of note, patient was previously admitted to [facility] 6/4-6/13/25 with E.coli bacteremia and signs of cholangitis. She underwent ERCP 6/6 that found choledocholithiasis. Sphincterotomy was performed and plastic stent placed.    She was admitted for management of sepsis, sacral wound with signs of sacral osteomyelitis.    Palliative team consulted for assistance with goals of care conversations.  Summary of counseling/coordination of care: Extensive chart review completed prior to meeting patient including labs, vital signs, imaging, progress notes, orders, and available advanced directive documents from current and previous encounters.   I attempted to see patient this morning but she was offered for her to have HD catheter removed.  A  second attempt this afternoon and met with patient while she was having a bath.  Brief symptom assessment completed.  Patient has no acute complaints or concerns at this time.  Further goals of care discussions to be continued at a later time.  No adjustment to plan of care at this time.   Of note, attending met with patient and family later today and CODE STATUS was changed to DNR with limited interventions.  Physical Exam Vitals reviewed.  Constitutional:      General: She is not in acute distress.    Appearance: She is obese.  Cardiovascular:     Rate and Rhythm: Normal rate.  Pulmonary:     Effort: Pulmonary effort is normal.  Abdominal:     Palpations: Abdomen is soft.  Neurological:     Mental Status: She is alert.  Psychiatric:        Mood and Affect: Mood normal.        Behavior: Behavior normal.             Total Time 25 minutes   Time spent includes: Detailed review of medical records (labs, imaging, vital signs), medically appropriate exam (mental status, respiratory, cardiac, skin), discussed with treatment team, counseling and educating patient, family and staff, documenting clinical information, medication management and coordination of care.  Lamarr L. Arvid, DNP, FNP-BC Palliative Medicine Team

## 2024-01-19 ENCOUNTER — Inpatient Hospital Stay

## 2024-01-19 DIAGNOSIS — M869 Osteomyelitis, unspecified: Secondary | ICD-10-CM | POA: Diagnosis not present

## 2024-01-19 DIAGNOSIS — R6520 Severe sepsis without septic shock: Secondary | ICD-10-CM | POA: Diagnosis not present

## 2024-01-19 DIAGNOSIS — A0472 Enterocolitis due to Clostridium difficile, not specified as recurrent: Secondary | ICD-10-CM

## 2024-01-19 DIAGNOSIS — R7881 Bacteremia: Secondary | ICD-10-CM

## 2024-01-19 DIAGNOSIS — A414 Sepsis due to anaerobes: Secondary | ICD-10-CM | POA: Diagnosis not present

## 2024-01-19 DIAGNOSIS — B9620 Unspecified Escherichia coli [E. coli] as the cause of diseases classified elsewhere: Secondary | ICD-10-CM | POA: Diagnosis not present

## 2024-01-19 DIAGNOSIS — R569 Unspecified convulsions: Secondary | ICD-10-CM | POA: Diagnosis not present

## 2024-01-19 DIAGNOSIS — I120 Hypertensive chronic kidney disease with stage 5 chronic kidney disease or end stage renal disease: Secondary | ICD-10-CM

## 2024-01-19 DIAGNOSIS — N179 Acute kidney failure, unspecified: Secondary | ICD-10-CM | POA: Diagnosis not present

## 2024-01-19 DIAGNOSIS — Z515 Encounter for palliative care: Secondary | ICD-10-CM | POA: Diagnosis not present

## 2024-01-19 DIAGNOSIS — R4182 Altered mental status, unspecified: Secondary | ICD-10-CM | POA: Diagnosis not present

## 2024-01-19 DIAGNOSIS — A419 Sepsis, unspecified organism: Secondary | ICD-10-CM | POA: Diagnosis not present

## 2024-01-19 DIAGNOSIS — A4151 Sepsis due to Escherichia coli [E. coli]: Secondary | ICD-10-CM | POA: Diagnosis not present

## 2024-01-19 DIAGNOSIS — K750 Abscess of liver: Secondary | ICD-10-CM | POA: Diagnosis not present

## 2024-01-19 DIAGNOSIS — R748 Abnormal levels of other serum enzymes: Secondary | ICD-10-CM | POA: Diagnosis not present

## 2024-01-19 LAB — GLUCOSE, CAPILLARY
Glucose-Capillary: 107 mg/dL — ABNORMAL HIGH (ref 70–99)
Glucose-Capillary: 115 mg/dL — ABNORMAL HIGH (ref 70–99)
Glucose-Capillary: 115 mg/dL — ABNORMAL HIGH (ref 70–99)
Glucose-Capillary: 126 mg/dL — ABNORMAL HIGH (ref 70–99)

## 2024-01-19 LAB — CBC
HCT: 23.3 % — ABNORMAL LOW (ref 36.0–46.0)
Hemoglobin: 7 g/dL — ABNORMAL LOW (ref 12.0–15.0)
MCH: 28.1 pg (ref 26.0–34.0)
MCHC: 30 g/dL (ref 30.0–36.0)
MCV: 93.6 fL (ref 80.0–100.0)
Platelets: 412 K/uL — ABNORMAL HIGH (ref 150–400)
RBC: 2.49 MIL/uL — ABNORMAL LOW (ref 3.87–5.11)
RDW: 17 % — ABNORMAL HIGH (ref 11.5–15.5)
WBC: 15.1 K/uL — ABNORMAL HIGH (ref 4.0–10.5)
nRBC: 0 % (ref 0.0–0.2)

## 2024-01-19 LAB — PREPARE RBC (CROSSMATCH)

## 2024-01-19 LAB — COPPER, SERUM: Copper: 57 ug/dL — ABNORMAL LOW (ref 80–158)

## 2024-01-19 LAB — HEPATIC FUNCTION PANEL
ALT: 18 U/L (ref 0–44)
AST: 49 U/L — ABNORMAL HIGH (ref 15–41)
Albumin: 1.9 g/dL — ABNORMAL LOW (ref 3.5–5.0)
Alkaline Phosphatase: 564 U/L — ABNORMAL HIGH (ref 38–126)
Bilirubin, Direct: 0.2 mg/dL (ref 0.0–0.2)
Indirect Bilirubin: 0.6 mg/dL (ref 0.3–0.9)
Total Bilirubin: 0.8 mg/dL (ref 0.0–1.2)
Total Protein: 6.4 g/dL — ABNORMAL LOW (ref 6.5–8.1)

## 2024-01-19 LAB — BASIC METABOLIC PANEL WITH GFR
Anion gap: 16 — ABNORMAL HIGH (ref 5–15)
BUN: 90 mg/dL — ABNORMAL HIGH (ref 8–23)
CO2: 18 mmol/L — ABNORMAL LOW (ref 22–32)
Calcium: 8.4 mg/dL — ABNORMAL LOW (ref 8.9–10.3)
Chloride: 102 mmol/L (ref 98–111)
Creatinine, Ser: 4.4 mg/dL — ABNORMAL HIGH (ref 0.44–1.00)
GFR, Estimated: 10 mL/min — ABNORMAL LOW (ref >60)
Glucose, Bld: 115 mg/dL — ABNORMAL HIGH (ref 70–99)
Potassium: 3.3 mmol/L — ABNORMAL LOW (ref 3.5–5.1)
Sodium: 136 mmol/L (ref 135–145)

## 2024-01-19 LAB — PHOSPHORUS: Phosphorus: 4.1 mg/dL (ref 2.5–4.6)

## 2024-01-19 LAB — MAGNESIUM: Magnesium: 2.2 mg/dL (ref 1.7–2.4)

## 2024-01-19 MED ORDER — ASPIRIN 81 MG PO CHEW
81.0000 mg | CHEWABLE_TABLET | Freq: Every day | ORAL | Status: DC
  Administered 2024-01-19 – 2024-01-20 (×2): 81 mg
  Filled 2024-01-19 (×2): qty 1

## 2024-01-19 MED ORDER — SODIUM CHLORIDE 0.9% IV SOLUTION: Freq: Once | INTRAVENOUS | Status: AC

## 2024-01-19 MED ORDER — SODIUM BICARBONATE 8.4 % IV SOLN
100.0000 meq | Freq: Three times a day (TID) | INTRAVENOUS | Status: AC
  Administered 2024-01-19 (×2): 100 meq via INTRAVENOUS
  Filled 2024-01-19: qty 50
  Filled 2024-01-19: qty 100

## 2024-01-19 NOTE — Plan of Care (Signed)
  Problem: Clinical Measurements: Goal: Respiratory complications will improve Outcome: Progressing   Problem: Clinical Measurements: Goal: Cardiovascular complication will be avoided Outcome: Progressing   Problem: Pain Managment: Goal: General experience of comfort will improve and/or be controlled Outcome: Progressing   Problem: Safety: Goal: Ability to remain free from injury will improve Outcome: Progressing

## 2024-01-19 NOTE — Progress Notes (Signed)
 [facility]  ROUNDING NOTE   Subjective:   [name] was admitted to [facility] on 01/15/2024 for Sepsis [facility]) [A41.9]  Patient received her complete hemodialysis treatment on day of admission which was recently lowered to 3 hours. Patient was premedicated with haloperidol , gabapentin  and oxycodone  before her treatment.   Update Patient laying in bed earlier today Ill appearing Family at bedside Voices concerns about lack of dialysis, bilateral hand edema   Objective:  Vital signs in last 24 hours:  Temp:  [97.2 F (36.2 C)-99.9 F (37.7 C)] 98.4 F (36.9 C) (07/09 1646) Pulse Rate:  [72-93] 86 (07/09 1646) Resp:  [16-20] 20 (07/09 1646) BP: (92-146)/(54-75) 146/73 (07/09 1646) SpO2:  [94 %-100 %] 98 % (07/09 1646) Weight:  [55.2 kg] 55.2 kg (07/09 0347)  Weight change: -0.1 kg Filed Weights   01/17/24 0500 01/18/24 0500 01/19/24 0347  Weight: 58.2 kg 55.3 kg 55.2 kg    Intake/Output: I/O last 3 completed shifts: In: 1242.3 [I.V.:524.9; Other:210; NG/GT:407.3; IV Piggyback:100.1] Out: -    Intake/Output this shift:  No intake/output data recorded.  Physical Exam: General: Ill appearing  Head: Normocephalic, atraumatic. Moist oral mucosal membranes  Eyes: Anicteric  Lungs:  Clear to auscultation, room air  Heart: Regular rate and rhythm  Abdomen:  Soft, nontender  Extremities:  Dependent bilateral hand peripheral edema.  Neurologic: Somnolent.   Skin: No lesions  Access: RIJ permcath    Basic Metabolic Panel: Recent Labs  Lab 01/15/24 1300 01/16/24 0419 01/17/24 0226 01/18/24 0405 01/19/24 0333  NA 134* 135 136 136 136  K 3.7 3.7 3.8 3.6 3.3*  CL 95* 97* 98 96* 102  CO2 21* 20* 21* 21* 18*  GLUCOSE 148* 104* 80 119* 115*  BUN 33* 45* 63* 82* 90*  CREATININE 3.10* 3.14* 3.78* 4.06* 4.40*  CALCIUM  8.3* 8.2* 8.5* 8.4* 8.4*  MG  --  2.1 2.3 2.4 2.2  PHOS  --  4.7* 5.3* 4.8* 4.1    Liver Function Tests: Recent Labs  Lab  01/15/24 1300 01/16/24 0419 01/17/24 0226 01/18/24 0405 01/19/24 0333  AST 89*  --  67* 46* 49*  ALT 26  --  23 18 18   ALKPHOS 862*  --  532* 489* 564*  BILITOT 0.7  --  0.9 0.7 0.8  PROT 7.4  --  5.8* 6.3* 6.4*  ALBUMIN 2.3* 2.1* 2.0* 2.0* 1.9*   Recent Labs  Lab 01/15/24 1300  LIPASE 32   No results for input(s): AMMONIA in the last 168 hours.  CBC: Recent Labs  Lab 01/15/24 1300 01/16/24 0419 01/17/24 0226 01/18/24 0405 01/18/24 1249 01/19/24 0333  WBC 21.6* 16.0* 20.8* 20.8* 16.5* 15.1*  NEUTROABS 20.3*  --   --   --   --   --   HGB 9.1* 9.2* 7.4* 7.2* 7.5* 7.0*  HCT 29.7* 29.2* 23.8* 23.5* 24.4* 23.3*  MCV 93.4 89.8 90.5 92.2 93.8 93.6  PLT 434* 398 371 424* 366 412*    Cardiac Enzymes: No results for input(s): CKTOTAL, CKMB, CKMBINDEX, TROPONINI in the last 168 hours.  BNP: Invalid input(s): POCBNP  CBG: Recent Labs  Lab 01/18/24 1619 01/18/24 2011 01/19/24 0003 01/19/24 0349 01/19/24 1618  GLUCAP 122* 120* 126* 115* 115*    Microbiology: Results for orders placed or performed during the hospital encounter of 01/15/24  Blood Culture (routine x 2)     Status: Abnormal   Collection Time: 01/15/24 12:42 PM   Specimen: BLOOD  Result Value Ref  Range Status   Specimen Description   Final    BLOOD RIGHT ANTECUBITAL Performed at [facility], [address] [place], [place] [address]    Special Requests   Final    BOTTLES DRAWN AEROBIC AND ANAEROBIC Blood Culture results may not be optimal due to an inadequate volume of blood received in culture bottles Performed at [facility], [address], [place], [place] [address]    Culture  Setup Time   Final    GRAM NEGATIVE RODS AEROBIC BOTTLE ONLY CRITICAL VALUE NOTED.  VALUE IS CONSISTENT WITH PREVIOUSLY REPORTED AND CALLED VALUE. Performed at [facility], [address] [place], [place] [address]    Culture (A)  Final    ESCHERICHIA  COLI SUSCEPTIBILITIES PERFORMED ON PREVIOUS CULTURE WITHIN THE LAST 5 DAYS. Performed at [facility], [address], [place], [place] [address]    Report Status 01/18/2024 FINAL  Final  Blood Culture (routine x 2)     Status: Abnormal   Collection Time: 01/15/24 12:42 PM   Specimen: BLOOD  Result Value Ref Range Status   Specimen Description   Final    BLOOD LEFT ANTECUBITAL Performed at [facility], [address] [place], [place] [address]    Special Requests   Final    BOTTLES DRAWN AEROBIC AND ANAEROBIC Blood Culture results may not be optimal due to an inadequate volume of blood received in culture bottles Performed at [facility], [address], [place], [place] [address]    Culture  Setup Time   Final    GRAM NEGATIVE RODS AEROBIC BOTTLE ONLY CRITICAL RESULT CALLED TO, READ BACK BY AND VERIFIED WITH: JASON ROBBINS, PHARMD @0132  01/16/2024 COP Performed at [facility], [address] [place], [place] [address]    Culture ESCHERICHIA COLI (A)  Final   Report Status 01/18/2024 FINAL  Final   Organism ID, Bacteria ESCHERICHIA COLI  Final   Organism ID, Bacteria ESCHERICHIA COLI  Final      Susceptibility   Escherichia coli - KIRBY BAUER*    CEFAZOLIN  INTERMEDIATE Intermediate    Escherichia coli - MIC*    AMPICILLIN >=32 RESISTANT Resistant     CEFEPIME  <=0.12 SENSITIVE Sensitive     CEFTAZIDIME <=1 SENSITIVE Sensitive     CEFTRIAXONE  <=0.25 SENSITIVE Sensitive     CIPROFLOXACIN >=4 RESISTANT Resistant     GENTAMICIN <=1 SENSITIVE Sensitive     IMIPENEM <=0.25 SENSITIVE Sensitive     TRIMETH /SULFA  <=20 SENSITIVE Sensitive     AMPICILLIN/SULBACTAM >=32 RESISTANT Resistant     PIP/TAZO <=4 SENSITIVE Sensitive ug/mL    * ESCHERICHIA COLI    ESCHERICHIA COLI  Blood Culture ID Panel (Reflexed)     Status: Abnormal   Collection Time: 01/15/24 12:42 PM  Result Value Ref Range Status   Enterococcus faecalis NOT DETECTED NOT DETECTED  Final   Enterococcus Faecium NOT DETECTED NOT DETECTED Final   Listeria monocytogenes NOT DETECTED NOT DETECTED Final   Staphylococcus species NOT DETECTED NOT DETECTED Final   Staphylococcus aureus (BCID) NOT DETECTED NOT DETECTED Final   Staphylococcus epidermidis NOT DETECTED NOT DETECTED Final   Staphylococcus lugdunensis NOT DETECTED NOT DETECTED Final   Streptococcus species NOT DETECTED NOT DETECTED Final   Streptococcus agalactiae NOT DETECTED NOT DETECTED Final   Streptococcus pneumoniae NOT DETECTED NOT DETECTED Final   Streptococcus pyogenes NOT DETECTED NOT DETECTED Final   A.calcoaceticus-baumannii NOT DETECTED NOT DETECTED Final   Bacteroides fragilis  NOT DETECTED NOT DETECTED Final   Enterobacterales DETECTED (A) NOT DETECTED Final    Comment: Enterobacterales represent a large order of gram negative bacteria, not a single organism. CRITICAL RESULT CALLED TO, READ BACK BY AND VERIFIED WITH: JASON ROBBINS, PHARMD @0132  01/16/2024 COP    Enterobacter cloacae complex NOT DETECTED NOT DETECTED Final   Escherichia coli DETECTED (A) NOT DETECTED Final    Comment: CRITICAL RESULT CALLED TO, READ BACK BY AND VERIFIED WITH: JASON ROBBINS, PHARMD @0132  01/16/2024 COP    Klebsiella aerogenes NOT DETECTED NOT DETECTED Final   Klebsiella oxytoca NOT DETECTED NOT DETECTED Final   Klebsiella pneumoniae NOT DETECTED NOT DETECTED Final   Proteus species NOT DETECTED NOT DETECTED Final   Salmonella species NOT DETECTED NOT DETECTED Final   Serratia marcescens NOT DETECTED NOT DETECTED Final   Haemophilus influenzae NOT DETECTED NOT DETECTED Final   Neisseria meningitidis NOT DETECTED NOT DETECTED Final   Pseudomonas aeruginosa NOT DETECTED NOT DETECTED Final   Stenotrophomonas maltophilia NOT DETECTED NOT DETECTED Final   Candida albicans NOT DETECTED NOT DETECTED Final   Candida auris NOT DETECTED NOT DETECTED Final   Candida glabrata NOT DETECTED NOT DETECTED Final   Candida krusei  NOT DETECTED NOT DETECTED Final   Candida parapsilosis NOT DETECTED NOT DETECTED Final   Candida tropicalis NOT DETECTED NOT DETECTED Final   Cryptococcus neoformans/gattii NOT DETECTED NOT DETECTED Final   CTX-M ESBL NOT DETECTED NOT DETECTED Final   Carbapenem resistance IMP NOT DETECTED NOT DETECTED Final   Carbapenem resistance KPC NOT DETECTED NOT DETECTED Final   Carbapenem resistance NDM NOT DETECTED NOT DETECTED Final   Carbapenem resist OXA 48 LIKE NOT DETECTED NOT DETECTED Final   Carbapenem resistance VIM NOT DETECTED NOT DETECTED Final    Comment: Performed at [facility], [address] [place], [place] [address]  MRSA Next Gen by PCR, Nasal     Status: None   Collection Time: 01/15/24  3:40 PM   Specimen: Nasal Mucosa; Nasal Swab  Result Value Ref Range Status   MRSA by PCR Next Gen NOT DETECTED NOT DETECTED Final    Comment: (NOTE) The GeneXpert MRSA Assay (FDA approved for NASAL specimens only), is one component of a comprehensive MRSA colonization surveillance program. It is not intended to diagnose MRSA infection nor to guide or monitor treatment for MRSA infections. Test performance is not FDA approved in patients less than 54 years old. Performed at [facility], [address] [place], [place] [address]   C Difficile Quick Screen w PCR reflex     Status: Abnormal   Collection Time: 01/16/24 10:32 AM   Specimen: Stool  Result Value Ref Range Status   C Diff antigen POSITIVE (A) NEGATIVE Final   C Diff toxin NEGATIVE NEGATIVE Final   C Diff interpretation Results are indeterminate. See PCR results.  Final    Comment: Performed at [facility], [address] [place], [place] [address]  Gastrointestinal Panel by PCR , Stool     Status: None   Collection Time: 01/16/24 10:32 AM   Specimen: Stool  Result Value Ref Range Status   Campylobacter species NOT DETECTED NOT DETECTED Final   Plesimonas shigelloides NOT DETECTED NOT  DETECTED Final   Salmonella species NOT DETECTED NOT DETECTED Final   Yersinia enterocolitica NOT DETECTED NOT DETECTED Final   Vibrio species NOT DETECTED NOT DETECTED Final   Vibrio cholerae NOT DETECTED NOT DETECTED Final   Enteroaggregative E coli (EAEC)  NOT DETECTED NOT DETECTED Final   Enteropathogenic E coli (EPEC) NOT DETECTED NOT DETECTED Final   Enterotoxigenic E coli (ETEC) NOT DETECTED NOT DETECTED Final   Shiga like toxin producing E coli (STEC) NOT DETECTED NOT DETECTED Final   Shigella/Enteroinvasive E coli (EIEC) NOT DETECTED NOT DETECTED Final   Cryptosporidium NOT DETECTED NOT DETECTED Final   Cyclospora cayetanensis NOT DETECTED NOT DETECTED Final   Entamoeba histolytica NOT DETECTED NOT DETECTED Final   Giardia lamblia NOT DETECTED NOT DETECTED Final   Adenovirus F40/41 NOT DETECTED NOT DETECTED Final   Astrovirus NOT DETECTED NOT DETECTED Final   Norovirus GI/GII NOT DETECTED NOT DETECTED Final   Rotavirus A NOT DETECTED NOT DETECTED Final   Sapovirus (I, II, IV, and V) NOT DETECTED NOT DETECTED Final    Comment: Performed at [facility], [address], [place], [place] [address]  C. Diff by PCR, Reflexed     Status: Abnormal   Collection Time: 01/16/24 10:32 AM  Result Value Ref Range Status   Toxigenic C. Difficile by PCR POSITIVE (A) NEGATIVE Final    Comment: Positive for toxigenic C. difficile with little to no toxin production. Only treat if clinical presentation suggests symptomatic illness. Performed at [facility], [address], [place], [place] [address]   Cath Tip Culture     Status: None (Preliminary result)   Collection Time: 01/18/24  8:45 AM   Specimen: Catheter Tip; Other  Result Value Ref Range Status   Specimen Description   Final    CATH TIP Performed at [facility], [address], [place], [place] [address]    Special Requests   Final    NONE Performed at [facility], [address]  [place], [place] [address]    Culture   Final    NO GROWTH < 24 HOURS Performed at [facility], [address], [place], [place] [address]    Report Status PENDING  Incomplete  Aerobic/Anaerobic Culture w Gram Stain (surgical/deep wound)     Status: None (Preliminary result)   Collection Time: 01/18/24 11:05 AM   Specimen: Liver; Abscess  Result Value Ref Range Status   Specimen Description   Final    LIVER 1 Performed at [facility], [address] [place], [place] [address]    Special Requests   Final    NONE Performed at [facility], [address] [place], [place] [address]    Gram Stain NO WBC SEEN NO ORGANISMS SEEN   Final   Culture   Final    NO GROWTH < 24 HOURS Performed at [facility], [address], [place], [place] [address]    Report Status PENDING  Incomplete  Aerobic/Anaerobic Culture w Gram Stain (surgical/deep wound)     Status: None (Preliminary result)   Collection Time: 01/18/24 11:06 AM   Specimen: Liver; Abscess  Result Value Ref Range Status   Specimen Description   Final    LIVER 2 Performed at [facility], [address] [place], [place] [address]    Special Requests   Final    NONE Performed at [facility], [address] [place], [place] [address]    Gram Stain   Final    ABUNDANT WBC PRESENT, PREDOMINANTLY PMN RARE GRAM NEGATIVE RODS    Culture   Final    RARE GRAM NEGATIVE RODS SUSCEPTIBILITIES TO FOLLOW Performed at [facility], [address],  [place], [place] [address]    Report Status PENDING  Incomplete    Coagulation Studies: No results for input(s): LABPROT, INR in the last 72 hours.   Urinalysis: No results for input(s): COLORURINE, LABSPEC, PHURINE, GLUCOSEU, HGBUR, BILIRUBINUR, KETONESUR, PROTEINUR, UROBILINOGEN, NITRITE, LEUKOCYTESUR in the last 72 hours.  Invalid input(s): APPERANCEUR    Imaging: EEG adult Result Date:  01/19/2024 Shelton Arlin KIDD, MD     01/19/2024  1:42 PM Patient Name: Sherry [name] MRN: [id number] Epilepsy Attending: Arlin KIDD Shelton Referring Physician/Provider: Voncile Isles, MD Date: 01/19/2024 Duration: 32.28 mins  Patient history: 68yo F with ams. EEG to evaluate for seizure  Level of alertness: comatose  AEDs during EEG study: LEV, LCM, Onfi   Technical aspects: This EEG study was done with scalp electrodes positioned according to the 10-20 International system of electrode placement. Electrical activity was reviewed with band pass filter of 1-70Hz , sensitivity of 7 uV/mm, display speed of 72mm/sec with a 60Hz  notched filter applied as appropriate. EEG data were recorded continuously and digitally stored.  Video monitoring was available and reviewed as appropriate.  Description: EEG showed near continuous generalized 2 to 3 Hz delta slowing admixed with 1-3 seconds of generalized EEG attenuation.  Generalized periodic discharges with triphasic morphology were also noted at 0.5 to 1.5 Hz. Hyperventilation and photic stimulation were not performed.    ABNORMALITY - Periodic discharges with triphasic morphology, generalized -Continuous slow, generalized -Background condition, generalized   IMPRESSION: This study showed generalized periodic discharges with triphasic morphology which can be on the ictal-interictal continuum.  However given the morphology and frequency, could also be secondary to toxic-metabolic causes.  Additionally there is severe diffuse encephalopathy.  No definite seizures were noted.  EEG appears slightly worse compared to previous study due to longer periods of eeg attenuations.   Arlin KIDD Shelton    MR BRAIN WO CONTRAST Result Date: 01/19/2024 CLINICAL DATA:  Provided history: Seizure disorder, clinical change. EXAM: MRI HEAD WITHOUT CONTRAST TECHNIQUE: Multiplanar, multiecho pulse sequences of the brain and surrounding structures were obtained without intravenous contrast. COMPARISON:   Head CT 01/15/2024.  Brain MRI 09/20/2023. FINDINGS: A routine protocol noncontrast brain MRI was performed. Brain: Stable cerebral atrophy. Small ill-defined foci of diffusion-weighted signal abnormality within the cortical/subcortical right occipital lobe and left occipital lobe white matter, likely reflecting acute/subacute infarcts. Small chronic cortically-based infarcts again demonstrated within the left parietal and anterior left temporal lobes. Background advanced patchy and confluent T2 FLAIR hyperintense signal abnormality within the cerebral white matter, nonspecific but compatible with chronic small vessel ischemic disease. Small chronic infarct again demonstrated within the left basal ganglia. Mild chronic small vessel ischemic changes within the pons. Known small chronic infarcts within the left cerebellar hemisphere. Small chronic infarct within the right cerebellar hemisphere, new from the prior MRI (series 14, image 9). Several small nonspecific chronic microhemorrhages scattered within the supratentorial brain. No evidence of an intracranial mass. No extra-axial fluid collection. No midline shift. Vascular: Maintained flow voids within the proximal large arterial vessels. Skull and upper cervical spine: Abnormal T1 hypointense marrow signal within the calvarium and within the visible portions of the cervical spine. Susceptibility artifact arising from ACDF hardware. Sinuses/Orbits: No mass or acute finding within the imaged orbits. No significant paranasal sinus disease. Other: Trace fluid within right mastoid air cells. Impression #1 will be called to the ordering clinician or representative by the Radiologist Assistant, and communication documented in the PACS or [employer]. IMPRESSION: 1. Small ill-defined foci of diffusion-weighted signal abnormality  within the cortical/subcortical right occipital lobe and left occipital lobe white matter, likely reflecting acute/subacute infarcts. 2.  Background parenchymal atrophy, chronic small vessel ischemic disease and chronic infarcts, as described. Electronically Signed   By: Rockey Childs D.O.   On: 01/19/2024 09:39   US  FINE NEEDLE ASP 1ST LESION Result Date: 01/18/2024 Sherry Cordella LABOR, MD     01/18/2024 11:44 AM Interventional Radiology Procedure Note Procedure: U/S guided aspiration liver abscess Complications: None Estimated Blood Loss: < 10 mL Findings: Two aspirations of two separte right lobe liver fluid collections.  Samples obtained and sent to Pathology. Cordella [name] Jenna, MD   PERIPHERAL VASCULAR CATHETERIZATION Result Date: 01/18/2024 See surgical note for result.  EEG adult Result Date: 01/18/2024 Shelton Arlin KIDD, MD     01/18/2024  8:39 AM Patient Name: Sherry [name] MRN: [id number] Epilepsy Attending: Arlin KIDD Shelton Referring Physician/Provider: Von Bellis, MD Date: 01/17/2024 Duration: 26.40 mins Patient history: 68yo F with ams. EEG to evaluate for seizure Level of alertness: comatose AEDs during EEG study: LEV, LCM, Onfi  Technical aspects: This EEG study was done with scalp electrodes positioned according to the 10-20 International system of electrode placement. Electrical activity was reviewed with band pass filter of 1-70Hz , sensitivity of 7 uV/mm, display speed of 80mm/sec with a 60Hz  notched filter applied as appropriate. EEG data were recorded continuously and digitally stored.  Video monitoring was available and reviewed as appropriate. Description: EEG showed near continuous generalized 2 to 3 Hz delta slowing admixed with 0.5 to 1 seconds of generalized EEG attenuation.  Generalized periodic discharges with triphasic morphology were also noted at 0.5 to 1.5 Hz. Hyperventilation and photic stimulation were not performed.   ABNORMALITY - Periodic discharges with triphasic morphology, generalized -Continuous slow, generalized -Background condition, generalized IMPRESSION: This study showed generalized periodic discharges  with triphasic morphology which can be on the ictal-interictal continuum.  However given the morphology and frequency, could also be secondary to toxic-metabolic causes.  Additionally there is severe diffuse encephalopathy.  No definite seizures were noted. If concern for ictal-interictal activity persist, please consider long-term EEG. Priyanka O Yadav      Medications:    cefTRIAXone  (ROCEPHIN )  IV 2 g (01/19/24 0346)   feeding supplement (NEPRO CARB STEADY) 237 mL (01/19/24 0542)   metronidazole  500 mg (01/19/24 1416)    sodium chloride    Intravenous Once   ascorbic acid   500 mg Per Tube BID   aspirin   81 mg Per Tube Daily   Chlorhexidine  Gluconate Cloth  6 each Topical Daily   cloBAZam   5 mg Per Tube Q12H   collagenase    Topical Daily   feeding supplement (PROSource TF20)  60 mL Per Tube Daily   [START ON 01/22/2024] haloperidol   1 mg Per Tube Once per day on Tuesday Saturday   heparin  injection (subcutaneous)  5,000 Units Subcutaneous Q8H   lacosamide   200 mg Per Tube BID   levETIRAcetam   500 mg Per Tube BID   lidocaine  (PF)  5 mL Subcutaneous Once   liver oil-zinc  oxide   Topical BID   midodrine   10 mg Per Tube TID WC   multivitamin  1 tablet Per Tube QHS   nutrition supplement (JUVEN)  1 packet Per Tube BID BM   pantoprazole  (PROTONIX ) IV  40 mg Intravenous QHS   saccharomyces boulardii  250 mg Per Tube BID   sertraline   25 mg Per Tube Daily   thiamine   100 mg Per Tube Daily   vancomycin   125  mg Per Tube QID   white petrolatum    Topical BID   zinc  sulfate (50mg  elemental zinc )  220 mg Per Tube Daily   acetaminophen , docusate, hydrALAZINE , HYDROmorphone  (DILAUDID ) injection, HYDROmorphone , hydrOXYzine , LORazepam , ondansetron  (ZOFRAN ) IV, mouth rinse, polyethylene glycol  Assessment/ Plan:  Ms. Teigen Parslow is a 68 y.o.  female with end stage renal disease on hemodialysis, seizure disorders, congestive heart failure, depression, bipolar disorder, and breast cancer who is  admitted to [facility] on 01/15/2024 for Sepsis [facility]) [A41.9]  [facility] RIJ permcath 56kg.   End Stage Renal Disease: no indication for dialysis.   jPermcath removed on 7/8. -Last dialysis session received on 7/5. - Will plan for at least 2 day line free holiday  - Will monitor clinical presentation and labs.  Hypotension: with sepsis - appreciate ICU input.  - Carvedilol  and hydralazine  restarted today - broad spectrum antibiotics, ceftriaxone  and vancomycin .  - Blood pressure has improved, 140/75  Anemia with chronic kidney disease: on Mircera as outpatient.  Hemoglobin decreased, 7.0.  Will order ESA with dialysis.  Secondary Hyperparathyroidism: Sevelamer  held.  Calcium  and phosphorus within optimal range.   LOS: 4 Jaxden Blyden 7/9/20256:32 PM

## 2024-01-19 NOTE — Progress Notes (Signed)
 Sherry Copping, MD [facility]   [address], [address] [place], [place] [address] Phone: [phone] Fax : [phone]   Subjective: The patient is status post aspiration of the liver cyst for possible abscess.  The patient had an ERCP with a stent placement for cholangitis with the bilirubin being normal meaning that the stent is not blocked.  The patient is on antibiotics and was also found to have C. difficile.  She is being followed by infectious disease.  She remains lethargic.   Objective: Vital signs in last 24 hours: Vitals:   01/18/24 2200 01/18/24 2351 01/19/24 0347 01/19/24 1611  BP: (!) 101/54 115/74 119/65 (!) 140/75  Pulse:  72  93  Resp: 18 18 17    Temp: (!) 97.2 F (36.2 C) 98.2 F (36.8 C) 98.4 F (36.9 C) 99.9 F (37.7 C)  TempSrc:  Axillary Oral   SpO2: 100% 94% 100% 98%  Weight:   55.2 kg   Height:       Weight change: -0.1 kg  Intake/Output Summary (Last 24 hours) at 01/19/2024 1612 Last data filed at 01/19/2024 0200 Gross per 24 hour  Intake 524.94 ml  Output --  Net 524.94 ml     Exam: Heart:: Regular rate and rhythm or without murmur or extra heart sounds Lungs: normal and clear to auscultation and percussion Abdomen: soft, nontender, normal bowel sounds   Lab Results: @LABTEST2 @ Micro Results: Recent Results (from the past 240 hours)  Blood Culture (routine x 2)     Status: Abnormal   Collection Time: 01/15/24 12:42 PM   Specimen: BLOOD  Result Value Ref Range Status   Specimen Description   Final    BLOOD RIGHT ANTECUBITAL Performed at [facility], [address] [place], [place] [address]    Special Requests   Final    BOTTLES DRAWN AEROBIC AND ANAEROBIC Blood Culture results may not be optimal due to an inadequate volume of blood received in culture bottles Performed at [facility], [address] [place], [place] [address]    Culture  Setup Time   Final    GRAM NEGATIVE RODS AEROBIC BOTTLE ONLY CRITICAL  VALUE NOTED.  VALUE IS CONSISTENT WITH PREVIOUSLY REPORTED AND CALLED VALUE. Performed at [facility], [address] [place], [place] [address]    Culture (A)  Final    ESCHERICHIA COLI SUSCEPTIBILITIES PERFORMED ON PREVIOUS CULTURE WITHIN THE LAST 5 DAYS. Performed at [facility], [address] [place], [place] [address]    Report Status 01/18/2024 FINAL  Final  Blood Culture (routine x 2)     Status: Abnormal   Collection Time: 01/15/24 12:42 PM   Specimen: BLOOD  Result Value Ref Range Status   Specimen Description   Final    BLOOD LEFT ANTECUBITAL Performed at [facility], [address] [place], [place] [address]    Special Requests   Final    BOTTLES DRAWN AEROBIC AND ANAEROBIC Blood Culture results may not be optimal due to an inadequate volume of blood received in culture bottles Performed at [facility], [address] [place], [place] [address]    Culture  Setup Time   Final    GRAM NEGATIVE RODS AEROBIC BOTTLE ONLY CRITICAL RESULT CALLED TO, READ BACK BY AND VERIFIED WITH: SELINDA SIMPERS, PHARMD @0132  01/16/2024 COP Performed at [facility], [address] [place], [place] [address]    Culture ESCHERICHIA COLI (A)  Final  Report Status 01/18/2024 FINAL  Final   Organism ID, Bacteria ESCHERICHIA COLI  Final   Organism ID, Bacteria ESCHERICHIA COLI  Final      Susceptibility   Escherichia coli - KIRBY BAUER*    CEFAZOLIN  INTERMEDIATE Intermediate    Escherichia coli - MIC*    AMPICILLIN >=32 RESISTANT Resistant     CEFEPIME  <=0.12 SENSITIVE Sensitive     CEFTAZIDIME <=1 SENSITIVE Sensitive     CEFTRIAXONE  <=0.25 SENSITIVE Sensitive     CIPROFLOXACIN >=4 RESISTANT Resistant     GENTAMICIN <=1 SENSITIVE Sensitive     IMIPENEM <=0.25 SENSITIVE Sensitive     TRIMETH /SULFA  <=20 SENSITIVE Sensitive     AMPICILLIN/SULBACTAM >=32 RESISTANT Resistant     PIP/TAZO <=4 SENSITIVE Sensitive ug/mL    * ESCHERICHIA COLI     ESCHERICHIA COLI  Blood Culture ID Panel (Reflexed)     Status: Abnormal   Collection Time: 01/15/24 12:42 PM  Result Value Ref Range Status   Enterococcus faecalis NOT DETECTED NOT DETECTED Final   Enterococcus Faecium NOT DETECTED NOT DETECTED Final   Listeria monocytogenes NOT DETECTED NOT DETECTED Final   Staphylococcus species NOT DETECTED NOT DETECTED Final   Staphylococcus aureus (BCID) NOT DETECTED NOT DETECTED Final   Staphylococcus epidermidis NOT DETECTED NOT DETECTED Final   Staphylococcus lugdunensis NOT DETECTED NOT DETECTED Final   Streptococcus species NOT DETECTED NOT DETECTED Final   Streptococcus agalactiae NOT DETECTED NOT DETECTED Final   Streptococcus pneumoniae NOT DETECTED NOT DETECTED Final   Streptococcus pyogenes NOT DETECTED NOT DETECTED Final   A.calcoaceticus-baumannii NOT DETECTED NOT DETECTED Final   Bacteroides fragilis NOT DETECTED NOT DETECTED Final   Enterobacterales DETECTED (A) NOT DETECTED Final    Comment: Enterobacterales represent a large order of gram negative bacteria, not a single organism. CRITICAL RESULT CALLED TO, READ BACK BY AND VERIFIED WITH: JASON ROBBINS, PHARMD @0132  01/16/2024 COP    Enterobacter cloacae complex NOT DETECTED NOT DETECTED Final   Escherichia coli DETECTED (A) NOT DETECTED Final    Comment: CRITICAL RESULT CALLED TO, READ BACK BY AND VERIFIED WITH: JASON ROBBINS, PHARMD @0132  01/16/2024 COP    Klebsiella aerogenes NOT DETECTED NOT DETECTED Final   Klebsiella oxytoca NOT DETECTED NOT DETECTED Final   Klebsiella pneumoniae NOT DETECTED NOT DETECTED Final   Proteus species NOT DETECTED NOT DETECTED Final   Salmonella species NOT DETECTED NOT DETECTED Final   Serratia marcescens NOT DETECTED NOT DETECTED Final   Haemophilus influenzae NOT DETECTED NOT DETECTED Final   Neisseria meningitidis NOT DETECTED NOT DETECTED Final   Pseudomonas aeruginosa NOT DETECTED NOT DETECTED Final   Stenotrophomonas maltophilia NOT  DETECTED NOT DETECTED Final   Candida albicans NOT DETECTED NOT DETECTED Final   Candida auris NOT DETECTED NOT DETECTED Final   Candida glabrata NOT DETECTED NOT DETECTED Final   Candida krusei NOT DETECTED NOT DETECTED Final   Candida parapsilosis NOT DETECTED NOT DETECTED Final   Candida tropicalis NOT DETECTED NOT DETECTED Final   Cryptococcus neoformans/gattii NOT DETECTED NOT DETECTED Final   CTX-M ESBL NOT DETECTED NOT DETECTED Final   Carbapenem resistance IMP NOT DETECTED NOT DETECTED Final   Carbapenem resistance KPC NOT DETECTED NOT DETECTED Final   Carbapenem resistance NDM NOT DETECTED NOT DETECTED Final   Carbapenem resist OXA 48 LIKE NOT DETECTED NOT DETECTED Final   Carbapenem resistance VIM NOT DETECTED NOT DETECTED Final    Comment: Performed at [facility], [address], [place], [place] [address]  MRSA Next  Gen by PCR, Nasal     Status: None   Collection Time: 01/15/24  3:40 PM   Specimen: Nasal Mucosa; Nasal Swab  Result Value Ref Range Status   MRSA by PCR Next Gen NOT DETECTED NOT DETECTED Final    Comment: (NOTE) The GeneXpert MRSA Assay (FDA approved for NASAL specimens only), is one component of a comprehensive MRSA colonization surveillance program. It is not intended to diagnose MRSA infection nor to guide or monitor treatment for MRSA infections. Test performance is not FDA approved in patients less than 62 years old. Performed at [facility], [address] [place], [place] [address]   C Difficile Quick Screen w PCR reflex     Status: Abnormal   Collection Time: 01/16/24 10:32 AM   Specimen: Stool  Result Value Ref Range Status   C Diff antigen POSITIVE (A) NEGATIVE Final   C Diff toxin NEGATIVE NEGATIVE Final   C Diff interpretation Results are indeterminate. See PCR results.  Final    Comment: Performed at [facility], [address] [place], [place] [address]  Gastrointestinal Panel by PCR , Stool     Status:  None   Collection Time: 01/16/24 10:32 AM   Specimen: Stool  Result Value Ref Range Status   Campylobacter species NOT DETECTED NOT DETECTED Final   Plesimonas shigelloides NOT DETECTED NOT DETECTED Final   Salmonella species NOT DETECTED NOT DETECTED Final   Yersinia enterocolitica NOT DETECTED NOT DETECTED Final   Vibrio species NOT DETECTED NOT DETECTED Final   Vibrio cholerae NOT DETECTED NOT DETECTED Final   Enteroaggregative E coli (EAEC) NOT DETECTED NOT DETECTED Final   Enteropathogenic E coli (EPEC) NOT DETECTED NOT DETECTED Final   Enterotoxigenic E coli (ETEC) NOT DETECTED NOT DETECTED Final   Shiga like toxin producing E coli (STEC) NOT DETECTED NOT DETECTED Final   Shigella/Enteroinvasive E coli (EIEC) NOT DETECTED NOT DETECTED Final   Cryptosporidium NOT DETECTED NOT DETECTED Final   Cyclospora cayetanensis NOT DETECTED NOT DETECTED Final   Entamoeba histolytica NOT DETECTED NOT DETECTED Final   Giardia lamblia NOT DETECTED NOT DETECTED Final   Adenovirus F40/41 NOT DETECTED NOT DETECTED Final   Astrovirus NOT DETECTED NOT DETECTED Final   Norovirus GI/GII NOT DETECTED NOT DETECTED Final   Rotavirus A NOT DETECTED NOT DETECTED Final   Sapovirus (I, II, IV, and V) NOT DETECTED NOT DETECTED Final    Comment: Performed at [facility], [address] [place], [place] [address]  C. Diff by PCR, Reflexed     Status: Abnormal   Collection Time: 01/16/24 10:32 AM  Result Value Ref Range Status   Toxigenic C. Difficile by PCR POSITIVE (A) NEGATIVE Final    Comment: Positive for toxigenic C. difficile with little to no toxin production. Only treat if clinical presentation suggests symptomatic illness. Performed at [facility], [address], [place], [place] [address]   Cath Tip Culture     Status: None (Preliminary result)   Collection Time: 01/18/24  8:45 AM   Specimen: Catheter Tip; Other  Result Value Ref Range Status   Specimen Description   Final     CATH TIP Performed at [facility], [address], [place], [place] [address]    Special Requests   Final    NONE Performed at [facility], [address] [place], [place] [address]    Culture   Final    NO GROWTH < 24 HOURS Performed at  [facility], [address], [place], [place] [address]    Report Status PENDING  Incomplete  Aerobic/Anaerobic Culture w Gram Stain (surgical/deep wound)     Status: None (Preliminary result)   Collection Time: 01/18/24 11:05 AM   Specimen: Liver; Abscess  Result Value Ref Range Status   Specimen Description   Final    LIVER 1 Performed at [facility], [address] [place], [place] [address]    Special Requests   Final    NONE Performed at [facility], [address] [place], [place] [address]    Gram Stain NO WBC SEEN NO ORGANISMS SEEN   Final   Culture   Final    NO GROWTH < 24 HOURS Performed at [facility], [address], [place], [place] [address]    Report Status PENDING  Incomplete  Aerobic/Anaerobic Culture w Gram Stain (surgical/deep wound)     Status: None (Preliminary result)   Collection Time: 01/18/24 11:06 AM   Specimen: Liver; Abscess  Result Value Ref Range Status   Specimen Description   Final    LIVER 2 Performed at [facility], [address] [place], [place] [address]    Special Requests   Final    NONE Performed at [facility], [address] [place], [place] [address]    Gram Stain   Final    ABUNDANT WBC PRESENT, PREDOMINANTLY PMN RARE GRAM NEGATIVE RODS    Culture   Final    RARE GRAM NEGATIVE RODS SUSCEPTIBILITIES TO FOLLOW Performed at [facility], [address] [place], [place] [address]    Report Status PENDING  Incomplete   Studies/Results: EEG adult Result Date: 01/19/2024 Shelton Arlin KIDD, MD     01/19/2024  1:42 PM Patient Name: Sherry [name] MRN: [id number] Epilepsy Attending: Arlin KIDD Shelton  Referring Physician/Provider: Voncile Isles, MD Date: 01/19/2024 Duration: 32.28 mins  Patient history: 68yo F with ams. EEG to evaluate for seizure  Level of alertness: comatose  AEDs during EEG study: LEV, LCM, Onfi   Technical aspects: This EEG study was done with scalp electrodes positioned according to the 10-20 International system of electrode placement. Electrical activity was reviewed with band pass filter of 1-70Hz , sensitivity of 7 uV/mm, display speed of 69mm/sec with a 60Hz  notched filter applied as appropriate. EEG data were recorded continuously and digitally stored.  Video monitoring was available and reviewed as appropriate.  Description: EEG showed near continuous generalized 2 to 3 Hz delta slowing admixed with 1-3 seconds of generalized EEG attenuation.  Generalized periodic discharges with triphasic morphology were also noted at 0.5 to 1.5 Hz. Hyperventilation and photic stimulation were not performed.    ABNORMALITY - Periodic discharges with triphasic morphology, generalized -Continuous slow, generalized -Background condition, generalized   IMPRESSION: This study showed generalized periodic discharges with triphasic morphology which can be on the ictal-interictal continuum.  However given the morphology and frequency, could also be secondary to toxic-metabolic causes.  Additionally there is severe diffuse encephalopathy.  No definite seizures were noted.  EEG appears slightly worse compared to previous study due to longer periods of eeg attenuations.   Arlin KIDD Shelton    MR BRAIN WO CONTRAST Result Date: 01/19/2024 CLINICAL DATA:  Provided history: Seizure disorder, clinical change. EXAM: MRI HEAD WITHOUT CONTRAST TECHNIQUE: Multiplanar, multiecho pulse sequences of the brain and surrounding structures were obtained without intravenous contrast. COMPARISON:  Head CT 01/15/2024.  Brain MRI 09/20/2023. FINDINGS: A routine  protocol noncontrast brain MRI was performed. Brain: Stable cerebral  atrophy. Small ill-defined foci of diffusion-weighted signal abnormality within the cortical/subcortical right occipital lobe and left occipital lobe white matter, likely reflecting acute/subacute infarcts. Small chronic cortically-based infarcts again demonstrated within the left parietal and anterior left temporal lobes. Background advanced patchy and confluent T2 FLAIR hyperintense signal abnormality within the cerebral white matter, nonspecific but compatible with chronic small vessel ischemic disease. Small chronic infarct again demonstrated within the left basal ganglia. Mild chronic small vessel ischemic changes within the pons. Known small chronic infarcts within the left cerebellar hemisphere. Small chronic infarct within the right cerebellar hemisphere, new from the prior MRI (series 14, image 9). Several small nonspecific chronic microhemorrhages scattered within the supratentorial brain. No evidence of an intracranial mass. No extra-axial fluid collection. No midline shift. Vascular: Maintained flow voids within the proximal large arterial vessels. Skull and upper cervical spine: Abnormal T1 hypointense marrow signal within the calvarium and within the visible portions of the cervical spine. Susceptibility artifact arising from ACDF hardware. Sinuses/Orbits: No mass or acute finding within the imaged orbits. No significant paranasal sinus disease. Other: Trace fluid within right mastoid air cells. Impression #1 will be called to the ordering clinician or representative by the Radiologist Assistant, and communication documented in the PACS or [employer]. IMPRESSION: 1. Small ill-defined foci of diffusion-weighted signal abnormality within the cortical/subcortical right occipital lobe and left occipital lobe white matter, likely reflecting acute/subacute infarcts. 2. Background parenchymal atrophy, chronic small vessel ischemic disease and chronic infarcts, as described. Electronically Signed   By:  Rockey Childs D.O.   On: 01/19/2024 09:39   US  FINE NEEDLE ASP 1ST LESION Result Date: 01/18/2024 Jenna Cordella LABOR, MD     01/18/2024 11:44 AM Interventional Radiology Procedure Note Procedure: U/S guided aspiration liver abscess Complications: None Estimated Blood Loss: < 10 mL Findings: Two aspirations of two separte right lobe liver fluid collections.  Samples obtained and sent to Pathology. Cordella LABOR Jenna, MD   PERIPHERAL VASCULAR CATHETERIZATION Result Date: 01/18/2024 See surgical note for result.  EEG adult Result Date: 01/18/2024 Shelton Arlin KIDD, MD     01/18/2024  8:39 AM Patient Name: Sherry [name] MRN: [id number] Epilepsy Attending: Arlin KIDD Shelton Referring Physician/Provider: Von Bellis, MD Date: 01/17/2024 Duration: 26.40 mins Patient history: 68yo F with ams. EEG to evaluate for seizure Level of alertness: comatose AEDs during EEG study: LEV, LCM, Onfi  Technical aspects: This EEG study was done with scalp electrodes positioned according to the 10-20 International system of electrode placement. Electrical activity was reviewed with band pass filter of 1-70Hz , sensitivity of 7 uV/mm, display speed of 77mm/sec with a 60Hz  notched filter applied as appropriate. EEG data were recorded continuously and digitally stored.  Video monitoring was available and reviewed as appropriate. Description: EEG showed near continuous generalized 2 to 3 Hz delta slowing admixed with 0.5 to 1 seconds of generalized EEG attenuation.  Generalized periodic discharges with triphasic morphology were also noted at 0.5 to 1.5 Hz. Hyperventilation and photic stimulation were not performed.   ABNORMALITY - Periodic discharges with triphasic morphology, generalized -Continuous slow, generalized -Background condition, generalized IMPRESSION: This study showed generalized periodic discharges with triphasic morphology which can be on the ictal-interictal continuum.  However given the morphology and frequency, could also be  secondary to toxic-metabolic causes.  Additionally there is severe diffuse encephalopathy.  No definite seizures were noted. If concern for ictal-interictal activity persist, please consider long-term EEG. Priyanka O Yadav   Medications:  I have reviewed the patient's current medications. Scheduled Meds:  sodium chloride    Intravenous Once   ascorbic acid   500 mg Per Tube BID   aspirin   81 mg Per Tube Daily   Chlorhexidine  Gluconate Cloth  6 each Topical Daily   cloBAZam   5 mg Per Tube Q12H   collagenase    Topical Daily   feeding supplement (PROSource TF20)  60 mL Per Tube Daily   [START ON 01/22/2024] haloperidol   1 mg Per Tube Once per day on Tuesday Saturday   heparin  injection (subcutaneous)  5,000 Units Subcutaneous Q8H   lacosamide   200 mg Per Tube BID   levETIRAcetam   500 mg Per Tube BID   lidocaine  (PF)  5 mL Subcutaneous Once   liver oil-zinc  oxide   Topical BID   midodrine   10 mg Per Tube TID WC   multivitamin  1 tablet Per Tube QHS   nutrition supplement (JUVEN)  1 packet Per Tube BID BM   pantoprazole  (PROTONIX ) IV  40 mg Intravenous QHS   saccharomyces boulardii  250 mg Per Tube BID   sertraline   25 mg Per Tube Daily   thiamine   100 mg Per Tube Daily   vancomycin   125 mg Per Tube QID   white petrolatum    Topical BID   zinc  sulfate (50mg  elemental zinc )  220 mg Per Tube Daily   Continuous Infusions:  sodium chloride  75 mL/hr at 01/19/24 0143   cefTRIAXone  (ROCEPHIN )  IV 2 g (01/19/24 0346)   feeding supplement (NEPRO CARB STEADY) 237 mL (01/19/24 0542)   metronidazole  500 mg (01/19/24 1416)   PRN Meds:.acetaminophen , docusate, hydrALAZINE , HYDROmorphone  (DILAUDID ) injection, HYDROmorphone , hydrOXYzine , LORazepam , ondansetron  (ZOFRAN ) IV, mouth rinse, polyethylene glycol   Assessment: Principal Problem:   Sepsis (HCC) Active Problems:   Osteomyelitis (HCC)   Pressure injury of skin   Protein-calorie malnutrition, severe   Altered mental status   Liver  abscess    Plan: The patient has encephalopathy due to sepsis with C. difficile and E. coli with a history of osteomyelitis from decubitus ulcer and liver lesions that have been aspirated.  The patient's alkaline phosphatase remains elevated likely from irritation from the stent but the bilirubin remains normal.  Will continue to follow and not recommending any intervention at this time.   LOS: 4 days   Sherry Copping, MD.FACG 01/19/2024, 4:12 PM Pager [phone] 7am-5pm  Check AMION for 5pm -7am coverage and on weekends

## 2024-01-19 NOTE — Procedures (Signed)
 Patient Name: Sherry [name]  MRN: [id number]  Epilepsy Attending: Arlin MALVA Krebs  Referring Physician/Provider: Voncile Isles, MD  Date: 01/19/2024 Duration: 32.28 mins   Patient history: 68yo F with ams. EEG to evaluate for seizure   Level of alertness: comatose   AEDs during EEG study: LEV, LCM, Onfi    Technical aspects: This EEG study was done with scalp electrodes positioned according to the 10-20 International system of electrode placement. Electrical activity was reviewed with band pass filter of 1-70Hz , sensitivity of 7 uV/mm, display speed of 21mm/sec with a 60Hz  notched filter applied as appropriate. EEG data were recorded continuously and digitally stored.  Video monitoring was available and reviewed as appropriate.   Description: EEG showed near continuous generalized 2 to 3 Hz delta slowing admixed with 1-3 seconds of generalized EEG attenuation.  Generalized periodic discharges with triphasic morphology were also noted at 0.5 to 1.5 Hz. Hyperventilation and photic stimulation were not performed.      ABNORMALITY - Periodic discharges with triphasic morphology, generalized -Continuous slow, generalized -Background condition, generalized     IMPRESSION: This study showed generalized periodic discharges with triphasic morphology which can be on the ictal-interictal continuum.  However given the morphology and frequency, could also be secondary to toxic-metabolic causes.  Additionally there is severe diffuse encephalopathy.  No definite seizures were noted.   EEG appears slightly worse compared to previous study due to longer periods of eeg attenuations.     Harry Bark O Ronnell Clinger

## 2024-01-19 NOTE — Progress Notes (Signed)
 Palliative Care Progress Note, Assessment & Plan   Patient Name: Sherry [name]       Date: 01/19/2024 DOB: 08/18/1955  Age: 68 y.o. MRN#: [id number] Attending Physician: Von Bellis, MD Primary Care Physician: Caleen Dirks, MD Admit Date: 01/15/2024  Subjective: Patient is lying in bed, resting, does not acknowledge my presence, does not awaken during my visit.  RN is at bedside reconnecting patient's lines that she has just returned from MRI and EEG.  Patient's sister/HCPOA is present at bedside during my visit.  HPI: 68 y.o. female  with past medical history significant for ESRD on HD, seizure disorder, chroninc hepatitis C, opioid use disorder, CVA, s/p gastrostomy tube, HTN, dCHF, depression with anxiety, bipolar, breast cancer s/p R mastectomy. She presented to [facility] ED from [facility] 01/15/2024 with c/o AMS and hypotension. It was reported that patient had received dialysis PTA and was somnolent throughout dialysis session.    ED workup significant for lactic 6.1, WBC 21.6, Hgb 9.1, Hct 29.7, plts 434, creatinine 3.1, BUN 33 and K+ 3.7. CXR negative.  BP 81/71, HR 100, RR 16, SpO2 100% RA, 98.6.    Of note, patient was previously admitted to [facility] 6/4-6/13/25 with E.coli bacteremia and signs of cholangitis. She underwent ERCP 6/6 that found choledocholithiasis. Sphincterotomy was performed and plastic stent placed.    She was admitted for management of sepsis, sacral wound with signs of sacral osteomyelitis.    Palliative team consulted for assistance with goals of care conversations.  Summary of counseling/coordination of care: Extensive chart review completed prior to meeting patient including labs, vital signs, imaging, progress notes, orders, and available advanced directive documents from  current and previous encounters.   After reviewing the patient's chart and assessing the patient at bedside, I spoke with patient's sister in regards to symptom management and goals of care.   Patient's sister shares that patient does not awaken to make conversation with her.  Patient remains encephalopathic.  Extensive discussion of patient's overall poor functional, nutritional, and cognitive status.  Discussed neurology's recommendations.  Reviewed that patient has had multiple months with minimal improvement in mentation.  Discussed she may not have a meaningful neurological recovery-despite maximizing medical treatments.  Therapeutic silence, active listening, and emotional support provided for patient's sister.  Patient Sister shares she does not want Sherry [name] to suffer.  She shares she wants to fight but does not want to do anything that will cause more harm than good.  Discussed holding hope and reality in equal parts.  Human mortality, limitations of the human body, and allowing the compassionate and dignified transition to end-of-life reviewed extensively with patient's sister.  Patient Sister shares that she wants to push until it seems like pushing will hurt her.  Establish rapport with sister that we will continue current regimen but if patient continues to show signs of no improvement or signs of suffering, we will transition to comfort measures.  Comfort measures again briefly discussed.  Patient's sister was appreciative of my visit.  She shares that if the patient is transition to comfort measures she asked that family be at bedside prior to making a change.  Again, advised that we are not making any changes to  plan of care at this time but that it is likely that given patient's lack of improvement she may be considered for comfort measures in the very near future.  PMT will continue to follow and support.  Above discussion conveyed to attending Dr. Von.  Physical Exam Vitals  reviewed.  Constitutional:      General: She is not in acute distress.    Appearance: She is ill-appearing. She is not toxic-appearing.  HENT:     Head: Normocephalic.     Mouth/Throat:     Mouth: Mucous membranes are moist.  Pulmonary:     Breath sounds: Rhonchi present.  Abdominal:     Palpations: Abdomen is soft.  Skin:    General: Skin is warm and dry.             Total Time 50 minutes   Time spent includes: Detailed review of medical records (labs, imaging, vital signs), medically appropriate exam (mental status, respiratory, cardiac, skin), discussed with treatment team, counseling and educating patient, family and staff, documenting clinical information, medication management and coordination of care.  Lamarr L. Arvid, DNP, FNP-BC Palliative Medicine Team

## 2024-01-19 NOTE — Progress Notes (Signed)
 Nutrition Follow-up  DOCUMENTATION CODES:   Severe malnutrition in context of chronic illness  INTERVENTION:   -TF via g-tube:   Nepro @ 40 ml/hr   60 ml Prosource TF daily   30 ml free water  flush every 4 hours   Tube feeding regimen provides 1808 kcal (100% of needs), 98 grams of protein, and 698 ml of H2O. Total free water : 878 ml daily   -Monitor Mg, K, and Phos and replete as needed secondary to refeeding risk -Continue renal MVI daily via tube -Continue 500 mg vitamin C  BID via tube -Continue 220 mg zinc  sulfate daily x 14 days via tube -Continue 100 mg thiamine  daily x 7 days -Continue 1 packet Juven BID via tube, each packet provides 95 calories, 2.5 grams of protein (collagen), and 9.8 grams of carbohydrate (3 grams sugar); also contains 7 grams of L-arginine and L-glutamine, 300 mg vitamin C , 15 mg vitamin E, 1.2 mcg vitamin B-12, 9.5 mg zinc , 200 mg calcium , and 1.5 g  Calcium  Beta-hydroxy-Beta-methylbutyrate to support wound healing  -Monitor and supplement for possible micronutrient deficiencies which may be impeding wound healing: copper    NUTRITION DIAGNOSIS:   Severe Malnutrition related to chronic illness (ESRD on HD) as evidenced by percent weight loss, moderate fat depletion, severe fat depletion, moderate muscle depletion, severe muscle depletion.  Ongoing  GOAL:   Patient will meet greater than or equal to 90% of their needs  Progressing   MONITOR:   TF tolerance  REASON FOR ASSESSMENT:   Consult Enteral/tube feeding initiation and management  ASSESSMENT:   Pt with a past medical history of end-stage renal disease on HD MWF via right tunneled cath, seizure disorder, chronic hepatitis C, opioid use disorder, stroke, status post gastrostomy tube, hypertension, diastolic congestive heart failure, depression with anxiety, bipolar, breast cancer status post right mastectomy presented for confusion and altered mental status and hypotension.  7/8- s/p  Removal of right jugular Permcath, s/p U/S guided aspiration liver abscess   Reviewed I/O's: +525 ml x 24 hours and +3 L since admission  Pt sleeping soundly at time of visit. SHe did not respond to voice or touch. No family present at time of visit.   Pt remains NPO and receiving nutrition via PEG for sole source nutrition. Nepro infusing at goal rate of 40 ml/hr.   Wt has been stable since admission.   Palliative care consult pending for goals of care discussions.   Medications reviewed and include vitamin C , santyl , haldol , vimpat , keppra , protonix , zinc  sulfate, and 0.9% sodium chloride  infusion @ 75 ml/hr.   Labs reviewed: K: 3.3, CBGS: 113-126. Vitamin A  WDL. Copper  level pending.   Diet Order:   Diet Order             Diet NPO time specified  Diet effective now                   EDUCATION NEEDS:   Not appropriate for education at this time  Skin:  Skin Assessment: Skin Integrity Issues: Skin Integrity Issues:: Stage IV, Stage II Stage II: rt ear Stage IV: sacrum  Last BM:  01/17/24 (type 6)  Height:   Ht Readings from Last 1 Encounters:  01/15/24 5' 2 (1.575 m)    Weight:   Wt Readings from Last 1 Encounters:  01/19/24 55.2 kg    Ideal Body Weight:  50 kg  BMI:  Body mass index is 22.26 kg/m.  Estimated Nutritional Needs:   Kcal:  1700-1900  Protein:  90-105 grams  Fluid:  1000 ml + UOP    Margery ORN, RD, LDN, CDCES Registered Dietitian III Certified Diabetes Care and Education Specialist If unable to reach this RD, please use RD Inpatient group chat on secure chat between hours of 8am-4 pm daily

## 2024-01-19 NOTE — Progress Notes (Signed)
  Progress Note    01/19/2024 10:35 AM 1 Day Post-Op  Subjective:  Sherry [name] is a 68 y.o. female y with history of ESRD on hemodialysis, HFpEF, bipolar, opioid use disorder, hypertension, Hep c (with cure, no evidence of cirrhosis), and arthritis here with e. Coli bacteremia of unknown source.  Patient underwent right chest dialysis permacath removal yesterday on 01/18/2024.  Patient is resting comfortably in bed this morning.  No complications overnight and vitals all remained stable.   Vitals:   01/18/24 2351 01/19/24 0347  BP: 115/74 119/65  Pulse: 72   Resp: 18 17  Temp: 98.2 F (36.8 C) 98.4 F (36.9 C)  SpO2: 94% 100%   Physical Exam: Cardiac:  RRR, No murmurs. Lungs:  good respiratory effort, Bilateral Air entry equal and Decreased, no Crackles, no wheezes  Incisions:  Right Chest. Dressing clean dry and intact.  Extremities:  Palpable pulses throughout. Abdomen:  Positive bowel sounds throughout, soft and non tender and non distended.  Neurologic: AAOX1 today. Follows commands.   CBC    Component Value Date/Time   WBC 15.1 (H) 01/19/2024 0333   RBC 2.49 (L) 01/19/2024 0333   HGB 7.0 (L) 01/19/2024 0333   HGB 10.2 (L) 07/25/2020 1104   HCT 23.3 (L) 01/19/2024 0333   HCT 30.8 (L) 07/25/2020 1104   PLT 412 (H) 01/19/2024 0333   PLT 258 07/25/2020 1104   MCV 93.6 01/19/2024 0333   MCV 85 07/25/2020 1104   MCH 28.1 01/19/2024 0333   MCHC 30.0 01/19/2024 0333   RDW 17.0 (H) 01/19/2024 0333   RDW 16.4 (H) 07/25/2020 1104   LYMPHSABS 0.6 (L) 01/15/2024 1300   LYMPHSABS 1.9 11/08/2017 1430   MONOABS 0.5 01/15/2024 1300   EOSABS 0.0 01/15/2024 1300   EOSABS 0.2 11/08/2017 1430   BASOSABS 0.0 01/15/2024 1300   BASOSABS 0.0 11/08/2017 1430    BMET    Component Value Date/Time   NA 136 01/19/2024 0333   NA 141 07/25/2020 1104   K 3.3 (L) 01/19/2024 0333   CL 102 01/19/2024 0333   CO2 18 (L) 01/19/2024 0333   GLUCOSE 115 (H) 01/19/2024 0333   BUN 90 (H)  01/19/2024 0333   BUN 41 (H) 07/25/2020 1104   CREATININE 4.40 (H) 01/19/2024 0333   CALCIUM  8.4 (L) 01/19/2024 0333   GFRNONAA 10 (L) 01/19/2024 0333   GFRAA 18 (L) 07/25/2020 1104    INR    Component Value Date/Time   INR 1.1 01/15/2024 1300     Intake/Output Summary (Last 24 hours) at 01/19/2024 1035 Last data filed at 01/19/2024 0200 Gross per 24 hour  Intake 524.94 ml  Output --  Net 524.94 ml     Assessment/Plan:  68 y.o. female is s/p Dialysis perm catheter removal.  1 Day Post-Op   Plan No complications to note post removal. Dressing clean dry and intact without hematoma or seroma.   DVT prophylaxis:  Heparin  5000 units SQ q8 hours.    Gwendlyn JONELLE [facility] 01/19/2024 10:35 AM

## 2024-01-19 NOTE — Plan of Care (Signed)
   Problem: Education: Goal: Knowledge of General Education information will improve Description: Including pain rating scale, medication(s)/side effects and non-pharmacologic comfort measures Outcome: Not Progressing

## 2024-01-19 NOTE — TOC Progression Note (Addendum)
 Transition of Care [facility]) - Progression Note    Patient Details  Name: Sherry [name] MRN: [id number] Date of Birth: [birth year]  Transition of Care [facility]) CM/SW Contact  Sherry JAYSON Carpen, LCSW Phone Number: 01/19/2024, 10:34 AM  Clinical Narrative: TOC continues to follow progress. Per notes, patient is unresponsive so therefore new SNF search has not been started yet.    2:25 pm: Met with sister at bedside. Patient remains unresponsive. Sister had asked about SNF. Explained that patient is currently unable to work with therapy to justify rehab stay. Sister is hopeful that mentation will improve and she ill be able to participate with therapy again. If not, will send out LTC SNF referral. Sister prefers the [place] area because that is where she lives. Will continue to follow progress.  Expected Discharge Plan and Services                                               Social Determinants of Health (SDOH) Interventions SDOH Screenings   Food Insecurity: Patient Unable To Answer (01/15/2024)  Housing: Patient Unable To Answer (01/15/2024)  Transportation Needs: Patient Unable To Answer (01/15/2024)  Utilities: Patient Unable To Answer (01/15/2024)  Alcohol  Screen: Medium Risk (08/27/2017)  Depression (PHQ2-9): Medium Risk (12/20/2020)  Social Connections: Patient Unable To Answer (01/15/2024)  Tobacco Use: High Risk (01/15/2024)    Readmission Risk Interventions    01/16/2024    4:23 PM 01/16/2024    9:55 AM  Readmission Risk Prevention Plan  Transportation Screening Complete   Medication Review (RN Care Manager) Complete Complete  PCP or Specialist appointment within 3-5 days of discharge Complete Complete  HRI or Home Care Consult Not Complete   SW Recovery Care/Counseling Consult Complete   Palliative Care Screening Not Applicable   Skilled Nursing Facility Complete

## 2024-01-19 NOTE — Progress Notes (Signed)
 [facility] Progress Note  Patient: Sherry [name]    FMW:[id number]  DOA: 01/15/2024     Date of Service: the patient was seen and examined on 01/19/2024  Chief Complaint  Patient presents with   Altered Mental Status   Brief hospital course: [name] is a 68 year old female patient with a past medical history of end-stage renal disease on HD MWF via right tunneled cath, seizure disorder, chronic hepatitis C, opioid use disorder, stroke, status post gastrostomy tube, hypertension, diastolic congestive heart failure, depression with anxiety, bipolar, breast cancer status post right mastectomy presenting to [facility] on 07/05 for confusion and altered mental status and hypotension.   She received dialysis earlier today and was reportedly described as being somnolent throughout the dialysis session.  She went back to her rehab and was found to be confused and unresponsive by the team and therefore EMS were called.  On arrival EMS she was found to have systolics in the 80s and therefore she was brought in to [facility] ED.   Of note, she was recently admitted to [facility] on 06/04 to 06/13 with E. coli bacteremia and signs of cholangitis.  She underwent ERCP on 06/06 and was noted to have choledocholithiasis, sphincterotomy was done and a plastic stent was placed.   In the ED, she was found to be hypotensive with MAP low 60s, she received 1 L normal saline bolus with good response to fluids.  She was started on broad-spectrum antibiotics with vancomycin  and aztreonam  and Flagyl .   Labs: Lactate 6.1 WBC 21.6 with 94% neutrophils, hemoglobin 9.1 and hematocrit 29.7, platelets 434. Electrolyte with CO2 21, BUN 33 and creatinine 3.1.  Potassium 3.7.  Chest x-ray without any acute process.  01/15/2024 admitted to the ICU.  Patient was given IV fluid, blood pressure stable, did not require pressor support.  Downgraded under [facility] service on 01/16/2024  Assessment and Plan:  # Metabolic encephalopathy due to  sepsis Continue supportive care, fall precautions, aspiration precaution Continue to treat as below   # Sepsis, multifactorial could be due to C. difficile and E. coli bacteremia, decub osteomyelitis and liver abscess H/o recent instrumentation with ERCP sphincterotomy and stent placement (12/17/2023)  Seen by GI recommended no need of repeat ERCP, may repeat MRI to look at the liver lesion. S/p vancomycin  and cefepime  Blood culture growing E. coli, continue ceftriaxone  Stool positive for C. difficile, started vancomycin  enteric via G-tube Vital signs stable, no need of vasopressors Transferred to progressive unit Follow ID for further recommendation  # C. difficile gastroenteritis 7/6 started  vancomycin  125 mg p.o. Q6 hourly for 10 days Started probiotics   # Liver abscess GI consulted, recommended MRI versus CT scan with contrast IR consulted, s/p Two aspirations of two separte right lobe liver fluid collections.  Samples obtained and sent to Pathology. Done on 7/8, follow fluid culture report Follow ID consult   # Decub osteomyelitis General Surgery consulted, recommended no intervention, less chances of healing Turn patient every 2 hours Follow ID for antibiotics   # ESRD on hemodialysis, MWF schedule Right tunneled catheter need to be removed due to E. coli bacteremia Nephrology consulted vascular surgery to remove removed tunneled HD catheter, which was removed on 7/8 Follow nephro for further recommendation 7/8 Follow catheter tip culture   # Seizure disorder Continue Keppra  500 twice daily clobazam  5 mg twice daily Vimpat  200 mg twice daily EEG: generalized periodic discharges with triphasic morphology which can be on the ictal-interictal continuum. could also be secondary  to toxic-metabolic causes. Additionally there is severe diffuse encephalopathy. No definite seizures were noted.  7/8 d/w neurology, recommended continue current treatment, will repeat EEG and  brain imaging tomorrow a.m. Overall poor prognosis, recommended palliative care. 7/9 MRI brain possible acute subacute stroke, and repeat EEG shows worsening of triphasic morphology consistent with ictal interictal continuum. Discussed with neurology, recommended comfort care due to poor prognosis.  # h/o CVA, resumed ASA  # Depression and bipolar disorder Resumed Haldol  and Zoloft  home dose  # HTN, diastolic CHF Continue to monitor BP and titrate medications accordingly Presented with sepsis, we will continue to monitor closely   # History of chronic hep C Follow-up with GI as an outpatient # History of breast cancer, no active issues, follow-up with oncology as an outpatient  # Iron deficiency, Tsat 4% Patient will benefit from IV iron infusion, cannot be given now due to acute infection.  Patient can be evaluated as an outpatient for IV iron transfusion Start oral iron supplement on discharge with vitamin C    Body mass index is 22.26 kg/m.  Interventions:   Diet: G-tube feeding DVT Prophylaxis: Subcutaneous Heparin     Advance goals of care discussion: DNR/DNI-Limited  Family Communication: family was present at bedside, at the time of interview.  Patient remained encephalopathic, unable to communicate. 7/8 goals of care discussed with family, management plan discussed in details. Family agreed to change CODE STATUS to DNR/DNI.   Disposition:  Pt is from SNF, admitted with sepsis, still on IV antibiotic, which precludes a safe discharge. Discharge to SNF, when stable, overall prognosis is poor, palliative care consulted, patient may be appropriate for hospice care. Need more discussion with family for comfort care.   Subjective: No significant events overnight, patient remained obtunded and encephalopathic.  No response to painful stimuli. Blinking eyes spontaneously.   Physical Exam: General: NAD, lying comfortably, remains encephalopathic Eyes: Spontaneously  opening and blinking ENT: Oral Mucosa dry   Neck: no JVD,  Cardiovascular: S1 and S2 Present, no Murmur,  Respiratory: Equal air entry bilaterally, conducting sounds due to oral secretions, no wheezes Abdomen: Bowel Sound present, Soft and no tenderness,  Extremities: no Pedal edema, no calf tenderness Neurologic: Remained encephalopathic, unable to respond to painful stimuli Gait not checked due to patient safety concerns  Vitals:   01/18/24 2059 01/18/24 2200 01/18/24 2351 01/19/24 0347  BP: 93/60 (!) 101/54 115/74 119/65  Pulse:   72   Resp: 18 18 18 17   Temp: 98 F (36.7 C) (!) 97.2 F (36.2 C) 98.2 F (36.8 C) 98.4 F (36.9 C)  TempSrc: Oral  Axillary Oral  SpO2: 97% 100% 94% 100%  Weight:    55.2 kg  Height:        Intake/Output Summary (Last 24 hours) at 01/19/2024 1452 Last data filed at 01/19/2024 0200 Gross per 24 hour  Intake 524.94 ml  Output --  Net 524.94 ml   Filed Weights   01/17/24 0500 01/18/24 0500 01/19/24 0347  Weight: 58.2 kg 55.3 kg 55.2 kg    Data Reviewed: I have personally reviewed and interpreted daily labs, tele strips, imagings as discussed above. I reviewed all nursing notes, pharmacy notes, vitals, pertinent old records I have discussed plan of care as described above with RN and patient/family.  CBC: Recent Labs  Lab 01/15/24 1300 01/16/24 0419 01/17/24 0226 01/18/24 0405 01/18/24 1249 01/19/24 0333  WBC 21.6* 16.0* 20.8* 20.8* 16.5* 15.1*  NEUTROABS 20.3*  --   --   --   --   --  HGB 9.1* 9.2* 7.4* 7.2* 7.5* 7.0*  HCT 29.7* 29.2* 23.8* 23.5* 24.4* 23.3*  MCV 93.4 89.8 90.5 92.2 93.8 93.6  PLT 434* 398 371 424* 366 412*   Basic Metabolic Panel: Recent Labs  Lab 01/15/24 1300 01/16/24 0419 01/17/24 0226 01/18/24 0405 01/19/24 0333  NA 134* 135 136 136 136  K 3.7 3.7 3.8 3.6 3.3*  CL 95* 97* 98 96* 102  CO2 21* 20* 21* 21* 18*  GLUCOSE 148* 104* 80 119* 115*  BUN 33* 45* 63* 82* 90*  CREATININE 3.10* 3.14* 3.78* 4.06*  4.40*  CALCIUM  8.3* 8.2* 8.5* 8.4* 8.4*  MG  --  2.1 2.3 2.4 2.2  PHOS  --  4.7* 5.3* 4.8* 4.1    Studies: EEG adult Result Date: 01/19/2024 Shelton Arlin KIDD, MD     01/19/2024  1:42 PM Patient Name: Taleeyah Bora MRN: [id number] Epilepsy Attending: Arlin KIDD Shelton Referring Physician/Provider: Voncile Isles, MD Date: 01/19/2024 Duration: 32.28 mins  Patient history: 68yo F with ams. EEG to evaluate for seizure  Level of alertness: comatose  AEDs during EEG study: LEV, LCM, Onfi   Technical aspects: This EEG study was done with scalp electrodes positioned according to the 10-20 International system of electrode placement. Electrical activity was reviewed with band pass filter of 1-70Hz , sensitivity of 7 uV/mm, display speed of 90mm/sec with a 60Hz  notched filter applied as appropriate. EEG data were recorded continuously and digitally stored.  Video monitoring was available and reviewed as appropriate.  Description: EEG showed near continuous generalized 2 to 3 Hz delta slowing admixed with 1-3 seconds of generalized EEG attenuation.  Generalized periodic discharges with triphasic morphology were also noted at 0.5 to 1.5 Hz. Hyperventilation and photic stimulation were not performed.    ABNORMALITY - Periodic discharges with triphasic morphology, generalized -Continuous slow, generalized -Background condition, generalized   IMPRESSION: This study showed generalized periodic discharges with triphasic morphology which can be on the ictal-interictal continuum.  However given the morphology and frequency, could also be secondary to toxic-metabolic causes.  Additionally there is severe diffuse encephalopathy.  No definite seizures were noted.  EEG appears slightly worse compared to previous study due to longer periods of eeg attenuations.   Arlin KIDD Shelton    MR BRAIN WO CONTRAST Result Date: 01/19/2024 CLINICAL DATA:  Provided history: Seizure disorder, clinical change. EXAM: MRI HEAD WITHOUT CONTRAST TECHNIQUE:  Multiplanar, multiecho pulse sequences of the brain and surrounding structures were obtained without intravenous contrast. COMPARISON:  Head CT 01/15/2024.  Brain MRI 09/20/2023. FINDINGS: A routine protocol noncontrast brain MRI was performed. Brain: Stable cerebral atrophy. Small ill-defined foci of diffusion-weighted signal abnormality within the cortical/subcortical right occipital lobe and left occipital lobe white matter, likely reflecting acute/subacute infarcts. Small chronic cortically-based infarcts again demonstrated within the left parietal and anterior left temporal lobes. Background advanced patchy and confluent T2 FLAIR hyperintense signal abnormality within the cerebral white matter, nonspecific but compatible with chronic small vessel ischemic disease. Small chronic infarct again demonstrated within the left basal ganglia. Mild chronic small vessel ischemic changes within the pons. Known small chronic infarcts within the left cerebellar hemisphere. Small chronic infarct within the right cerebellar hemisphere, new from the prior MRI (series 14, image 9). Several small nonspecific chronic microhemorrhages scattered within the supratentorial brain. No evidence of an intracranial mass. No extra-axial fluid collection. No midline shift. Vascular: Maintained flow voids within the proximal large arterial vessels. Skull and upper cervical spine: Abnormal T1 hypointense marrow signal within the calvarium and  within the visible portions of the cervical spine. Susceptibility artifact arising from ACDF hardware. Sinuses/Orbits: No mass or acute finding within the imaged orbits. No significant paranasal sinus disease. Other: Trace fluid within right mastoid air cells. Impression #1 will be called to the ordering clinician or representative by the Radiologist Assistant, and communication documented in the PACS or [employer]. IMPRESSION: 1. Small ill-defined foci of diffusion-weighted signal abnormality  within the cortical/subcortical right occipital lobe and left occipital lobe white matter, likely reflecting acute/subacute infarcts. 2. Background parenchymal atrophy, chronic small vessel ischemic disease and chronic infarcts, as described. Electronically Signed   By: Rockey Childs D.O.   On: 01/19/2024 09:39     Scheduled Meds:  sodium chloride    Intravenous Once   ascorbic acid   500 mg Per Tube BID   Chlorhexidine  Gluconate Cloth  6 each Topical Daily   cloBAZam   5 mg Per Tube Q12H   collagenase    Topical Daily   feeding supplement (PROSource TF20)  60 mL Per Tube Daily   [START ON 01/22/2024] haloperidol   1 mg Per Tube Once per day on Tuesday Saturday   heparin  injection (subcutaneous)  5,000 Units Subcutaneous Q8H   lacosamide   200 mg Per Tube BID   levETIRAcetam   500 mg Per Tube BID   lidocaine  (PF)  5 mL Subcutaneous Once   liver oil-zinc  oxide   Topical BID   midodrine   10 mg Per Tube TID WC   multivitamin  1 tablet Per Tube QHS   nutrition supplement (JUVEN)  1 packet Per Tube BID BM   pantoprazole  (PROTONIX ) IV  40 mg Intravenous QHS   saccharomyces boulardii  250 mg Per Tube BID   sertraline   25 mg Per Tube Daily   thiamine   100 mg Per Tube Daily   vancomycin   125 mg Per Tube QID   white petrolatum    Topical BID   zinc  sulfate (50mg  elemental zinc )  220 mg Per Tube Daily   Continuous Infusions:  sodium chloride  75 mL/hr at 01/19/24 0143   cefTRIAXone  (ROCEPHIN )  IV 2 g (01/19/24 0346)   feeding supplement (NEPRO CARB STEADY) 237 mL (01/19/24 0542)   metronidazole  500 mg (01/19/24 1416)   PRN Meds: acetaminophen , docusate, hydrALAZINE , HYDROmorphone  (DILAUDID ) injection, HYDROmorphone , hydrOXYzine , LORazepam , ondansetron  (ZOFRAN ) IV, mouth rinse, polyethylene glycol  Time spent: 55 minutes  Author: ELVAN SOR. MD [facility] 01/19/2024 2:52 PM  To reach On-call, see care teams to locate the attending and reach out to them via [email or web address]. If 7PM-7AM, please  contact night-coverage If you still have difficulty reaching the attending provider, please page the [facility] (Director on Call) for [facility] on amion for assistance.

## 2024-01-19 NOTE — Progress Notes (Signed)
 Date of Admission:  01/15/2024     ID: Sherry [name] is a 68 y.o. female  Principal Problem:   Sepsis (HCC) Active Problems:   Osteomyelitis (HCC)   Pressure injury of skin   Protein-calorie malnutrition, severe   Altered mental status   Liver abscess  Sherry [name] is a 68 y.o. female complicated with history and recurrent hospitalizations in the past 12 months presents from [facility] with altered mental status on 01/15/2024. She has a history of end-stage renal disease on hemodialysis, sacral decubitus with osteomyelitis, history of ascending cholangitis with multiple liver abscesses, E. coli bacteremia in June 2025, CVA, seizure, hypertension, was recently in hospital between December 18, 2023 until December 24, 2023 and was seen by infectious disease for E. coli bacteremia and was treated with IV Unasyn for 2 weeks.  An MRCP done during that time showed  dilated biliary radicles of the right side of the liver.  The bile ducts had an appearance of string of pearl with multiple areas of focal narrowing concerning for cholangitis.  There was CBD dilatation.  She underwent ERCP and plastic stent placement.  At that time patient also had Enterococcus in the sacral wound which was resistant to Vanco but susceptible to ampicillin Patient was discharged to [facility] health care during last hospitalization Prior to this hospitalization she was at [facility] in the month of February for hypertension, PRES, seizures , AMS and during that hospitalization had tracheostomy as well as PEG.  LP done that time was N, HEPC RNA neg The tracheostomy site is closed.  Patient has PEG  Subjective: Pt remains obtunded  Medications:   sodium chloride    Intravenous Once   ascorbic acid   500 mg Per Tube BID   Chlorhexidine  Gluconate Cloth  6 each Topical Daily   cloBAZam   5 mg Per Tube Q12H   collagenase    Topical Daily   feeding supplement (PROSource TF20)  60 mL Per Tube Daily   [START ON 01/22/2024] haloperidol   1  mg Per Tube Once per day on Tuesday Saturday   heparin  injection (subcutaneous)  5,000 Units Subcutaneous Q8H   lacosamide   200 mg Per Tube BID   levETIRAcetam   500 mg Per Tube BID   lidocaine  (PF)  5 mL Subcutaneous Once   liver oil-zinc  oxide   Topical BID   midodrine   10 mg Per Tube TID WC   multivitamin  1 tablet Per Tube QHS   nutrition supplement (JUVEN)  1 packet Per Tube BID BM   pantoprazole  (PROTONIX ) IV  40 mg Intravenous QHS   saccharomyces boulardii  250 mg Per Tube BID   sertraline   25 mg Per Tube Daily   sodium bicarbonate   100 mEq Intravenous Q8H   thiamine   100 mg Per Tube Daily   vancomycin   125 mg Per Tube QID   white petrolatum    Topical BID   zinc  sulfate (50mg  elemental zinc )  220 mg Per Tube Daily    Objective: Vital signs in last 24 hours: Patient Vitals for the past 24 hrs:  BP Temp Temp src Pulse Resp SpO2 Weight  01/19/24 0347 119/65 98.4 F (36.9 C) Oral -- 17 100 % 55.2 kg  01/18/24 2351 115/74 98.2 F (36.8 C) Axillary 72 18 94 % --  01/18/24 2200 (!) 101/54 (!) 97.2 F (36.2 C) -- -- 18 100 % --  01/18/24 2059 93/60 98 F (36.7 C) Oral -- 18 97 % --  01/18/24 1959 (!) 92/58 98.3 F (  36.8 C) Axillary 73 16 100 % --  01/18/24 1800 (!) 90/58 98.3 F (36.8 C) Axillary -- 16 -- --  01/18/24 1624 (!) 90/48 98.4 F (36.9 C) -- 73 -- 100 % --  01/18/24 1200 -- -- -- -- 17 -- --  01/18/24 1141 130/69 99.7 F (37.6 C) -- 82 -- 100 % --  01/18/24 1116 121/67 -- -- 80 20 96 % --  01/18/24 1100 -- -- -- 80 -- 96 % --  01/18/24 1047 110/77 -- -- 82 20 97 % --  01/18/24 1021 110/77 (!) 97 F (36.1 C) Temporal 81 -- 94 % --      PHYSICAL EXAM:  General: obtunded Back: stage iv sacral decubitus Lungs: b/l air entry Heart: Regular rate and rhythm, no murmur, rub or gallop. Abdomen: Soft, peg  Extremities: atraumatic, no cyanosis. No edema. No clubbing Skin: No rashes or lesions. Or bruising Lymph: Cervical, supraclavicular normal. Neurologic:  cannot assess  Lab Results    Latest Ref Rng & Units 01/19/2024    3:33 AM 01/18/2024   12:49 PM 01/18/2024    4:05 AM  CBC  WBC 4.0 - 10.5 K/uL 15.1  16.5  20.8   Hemoglobin 12.0 - 15.0 g/dL 7.0  7.5  7.2   Hematocrit 36.0 - 46.0 % 23.3  24.4  23.5   Platelets 150 - 400 K/uL 412  366  424        Latest Ref Rng & Units 01/19/2024    3:33 AM 01/18/2024    4:05 AM 01/17/2024    2:26 AM  CMP  Glucose 70 - 99 mg/dL 884  880  80   BUN 8 - 23 mg/dL 90  82  63   Creatinine 0.44 - 1.00 mg/dL 5.59  5.93  6.21   Sodium 135 - 145 mmol/L 136  136  136   Potassium 3.5 - 5.1 mmol/L 3.3  3.6  3.8   Chloride 98 - 111 mmol/L 102  96  98   CO2 22 - 32 mmol/L 18  21  21    Calcium  8.9 - 10.3 mg/dL 8.4  8.4  8.5   Total Protein 6.5 - 8.1 g/dL 6.4  6.3  5.8   Total Bilirubin 0.0 - 1.2 mg/dL 0.8  0.7  0.9   Alkaline Phos 38 - 126 U/L 564  489  532   AST 15 - 41 U/L 49  46  67   ALT 0 - 44 U/L 18  18  23        Microbiology: 01/15/24 BC- Ecoli Studies/Results: MR BRAIN WO CONTRAST Result Date: 01/19/2024 CLINICAL DATA:  Provided history: Seizure disorder, clinical change. EXAM: MRI HEAD WITHOUT CONTRAST TECHNIQUE: Multiplanar, multiecho pulse sequences of the brain and surrounding structures were obtained without intravenous contrast. COMPARISON:  Head CT 01/15/2024.  Brain MRI 09/20/2023. FINDINGS: A routine protocol noncontrast brain MRI was performed. Brain: Stable cerebral atrophy. Small ill-defined foci of diffusion-weighted signal abnormality within the cortical/subcortical right occipital lobe and left occipital lobe white matter, likely reflecting acute/subacute infarcts. Small chronic cortically-based infarcts again demonstrated within the left parietal and anterior left temporal lobes. Background advanced patchy and confluent T2 FLAIR hyperintense signal abnormality within the cerebral white matter, nonspecific but compatible with chronic small vessel ischemic disease. Small chronic infarct again  demonstrated within the left basal ganglia. Mild chronic small vessel ischemic changes within the pons. Known small chronic infarcts within the left cerebellar hemisphere. Small chronic infarct within the right cerebellar  hemisphere, new from the prior MRI (series 14, image 9). Several small nonspecific chronic microhemorrhages scattered within the supratentorial brain. No evidence of an intracranial mass. No extra-axial fluid collection. No midline shift. Vascular: Maintained flow voids within the proximal large arterial vessels. Skull and upper cervical spine: Abnormal T1 hypointense marrow signal within the calvarium and within the visible portions of the cervical spine. Susceptibility artifact arising from ACDF hardware. Sinuses/Orbits: No mass or acute finding within the imaged orbits. No significant paranasal sinus disease. Other: Trace fluid within right mastoid air cells. Impression #1 will be called to the ordering clinician or representative by the Radiologist Assistant, and communication documented in the PACS or [employer]. IMPRESSION: 1. Small ill-defined foci of diffusion-weighted signal abnormality within the cortical/subcortical right occipital lobe and left occipital lobe white matter, likely reflecting acute/subacute infarcts. 2. Background parenchymal atrophy, chronic small vessel ischemic disease and chronic infarcts, as described. Electronically Signed   By: Rockey Childs D.O.   On: 01/19/2024 09:39   US  FINE NEEDLE ASP 1ST LESION Result Date: 01/18/2024 Jenna Cordella LABOR, MD     01/18/2024 11:44 AM Interventional Radiology Procedure Note Procedure: U/S guided aspiration liver abscess Complications: None Estimated Blood Loss: < 10 mL Findings: Two aspirations of two separte right lobe liver fluid collections.  Samples obtained and sent to Pathology. Cordella LABOR Jenna, MD   PERIPHERAL VASCULAR CATHETERIZATION Result Date: 01/18/2024 See surgical note for result.  EEG adult Result Date:  01/18/2024 Shelton Arlin KIDD, MD     01/18/2024  8:39 AM Patient Name: Tenille Morrill MRN: [id number] Epilepsy Attending: Arlin KIDD Shelton Referring Physician/Provider: Von Bellis, MD Date: 01/17/2024 Duration: 26.40 mins Patient history: 68yo F with ams. EEG to evaluate for seizure Level of alertness: comatose AEDs during EEG study: LEV, LCM, Onfi  Technical aspects: This EEG study was done with scalp electrodes positioned according to the 10-20 International system of electrode placement. Electrical activity was reviewed with band pass filter of 1-70Hz , sensitivity of 7 uV/mm, display speed of 89mm/sec with a 60Hz  notched filter applied as appropriate. EEG data were recorded continuously and digitally stored.  Video monitoring was available and reviewed as appropriate. Description: EEG showed near continuous generalized 2 to 3 Hz delta slowing admixed with 0.5 to 1 seconds of generalized EEG attenuation.  Generalized periodic discharges with triphasic morphology were also noted at 0.5 to 1.5 Hz. Hyperventilation and photic stimulation were not performed.   ABNORMALITY - Periodic discharges with triphasic morphology, generalized -Continuous slow, generalized -Background condition, generalized IMPRESSION: This study showed generalized periodic discharges with triphasic morphology which can be on the ictal-interictal continuum.  However given the morphology and frequency, could also be secondary to toxic-metabolic causes.  Additionally there is severe diffuse encephalopathy.  No definite seizures were noted. If concern for ictal-interictal activity persist, please consider long-term EEG. Priyanka KIDD Shelton     Assessment/Plan: Recurrent E. coli bacteremia with a history of obstruction of biliary ductal system , placement of stent in June 2025.  The current source for E. coli is likely the biliary system Ascending cholangitis a concern There is an element of dilated biliary ductules / multiple hepatic abscesses IR  aspirated 2 pockets in the liver  and sent for culture Patient is currently on ceftriaxone  we will have to treat the patient with at least 4 weeks    sacral decubitus with osteomyelitis.   Difficult situation.  Antibiotics will not heal this.  Needs offloading, air mattress    C. difficile infection.  Antigen positive but toxin negative and PCR positive On p.o. vancomycin   Anemia   Altered mental status- MRI shows multiple acute and chronic small infarcts in different lobes   End-stage renal disease was on dialysis through a HD catheter on the right chest wall. The catheter had been removed and dialysis on hold   History of small intestinal obstruction status post resection   Hypertension   History of seizures Poor prognosis  Discussed the management with sister at bed side and with the care team

## 2024-01-19 NOTE — Progress Notes (Signed)
 Eeg done

## 2024-01-20 ENCOUNTER — Other Ambulatory Visit: Payer: Self-pay

## 2024-01-20 ENCOUNTER — Encounter
Admission: EM | Disposition: A | Discharge status: Hospice | Payer: Self-pay | Source: Skilled Nursing Facility | Attending: Student

## 2024-01-20 DIAGNOSIS — Z992 Dependence on renal dialysis: Secondary | ICD-10-CM | POA: Diagnosis not present

## 2024-01-20 DIAGNOSIS — G9340 Encephalopathy, unspecified: Secondary | ICD-10-CM | POA: Diagnosis not present

## 2024-01-20 DIAGNOSIS — E43 Unspecified severe protein-calorie malnutrition: Secondary | ICD-10-CM

## 2024-01-20 DIAGNOSIS — M869 Osteomyelitis, unspecified: Secondary | ICD-10-CM | POA: Diagnosis not present

## 2024-01-20 DIAGNOSIS — G40901 Epilepsy, unspecified, not intractable, with status epilepticus: Secondary | ICD-10-CM | POA: Diagnosis not present

## 2024-01-20 DIAGNOSIS — N186 End stage renal disease: Secondary | ICD-10-CM

## 2024-01-20 DIAGNOSIS — K750 Abscess of liver: Secondary | ICD-10-CM | POA: Diagnosis not present

## 2024-01-20 DIAGNOSIS — A419 Sepsis, unspecified organism: Secondary | ICD-10-CM | POA: Diagnosis not present

## 2024-01-20 DIAGNOSIS — N289 Disorder of kidney and ureter, unspecified: Secondary | ICD-10-CM

## 2024-01-20 DIAGNOSIS — R4182 Altered mental status, unspecified: Secondary | ICD-10-CM | POA: Diagnosis not present

## 2024-01-20 DIAGNOSIS — N179 Acute kidney failure, unspecified: Secondary | ICD-10-CM | POA: Diagnosis not present

## 2024-01-20 DIAGNOSIS — L8990 Pressure ulcer of unspecified site, unspecified stage: Secondary | ICD-10-CM | POA: Diagnosis not present

## 2024-01-20 DIAGNOSIS — R6520 Severe sepsis without septic shock: Secondary | ICD-10-CM | POA: Diagnosis not present

## 2024-01-20 DIAGNOSIS — Z515 Encounter for palliative care: Secondary | ICD-10-CM | POA: Diagnosis not present

## 2024-01-20 DIAGNOSIS — E46 Unspecified protein-calorie malnutrition: Secondary | ICD-10-CM

## 2024-01-20 LAB — CBC
HCT: 26.7 % — ABNORMAL LOW (ref 36.0–46.0)
Hemoglobin: 8.6 g/dL — ABNORMAL LOW (ref 12.0–15.0)
MCH: 28.9 pg (ref 26.0–34.0)
MCHC: 32.2 g/dL (ref 30.0–36.0)
MCV: 89.6 fL (ref 80.0–100.0)
Platelets: 350 K/uL (ref 150–400)
RBC: 2.98 MIL/uL — ABNORMAL LOW (ref 3.87–5.11)
RDW: 16.8 % — ABNORMAL HIGH (ref 11.5–15.5)
WBC: 9.5 K/uL (ref 4.0–10.5)
nRBC: 0 % (ref 0.0–0.2)

## 2024-01-20 LAB — HEPATIC FUNCTION PANEL
ALT: 19 U/L (ref 0–44)
AST: 58 U/L — ABNORMAL HIGH (ref 15–41)
Albumin: 1.9 g/dL — ABNORMAL LOW (ref 3.5–5.0)
Alkaline Phosphatase: 632 U/L — ABNORMAL HIGH (ref 38–126)
Bilirubin, Direct: 0.1 mg/dL (ref 0.0–0.2)
Indirect Bilirubin: 0.7 mg/dL (ref 0.3–0.9)
Total Bilirubin: 0.8 mg/dL (ref 0.0–1.2)
Total Protein: 6 g/dL — ABNORMAL LOW (ref 6.5–8.1)

## 2024-01-20 LAB — CATH TIP CULTURE: Culture: NO GROWTH

## 2024-01-20 LAB — GLUCOSE, CAPILLARY
Glucose-Capillary: 110 mg/dL — ABNORMAL HIGH (ref 70–99)
Glucose-Capillary: 110 mg/dL — ABNORMAL HIGH (ref 70–99)
Glucose-Capillary: 114 mg/dL — ABNORMAL HIGH (ref 70–99)
Glucose-Capillary: 148 mg/dL — ABNORMAL HIGH (ref 70–99)

## 2024-01-20 LAB — BASIC METABOLIC PANEL WITH GFR
Anion gap: 20 — ABNORMAL HIGH (ref 5–15)
BUN: 101 mg/dL — ABNORMAL HIGH (ref 8–23)
CO2: 21 mmol/L — ABNORMAL LOW (ref 22–32)
Calcium: 8.5 mg/dL — ABNORMAL LOW (ref 8.9–10.3)
Chloride: 99 mmol/L (ref 98–111)
Creatinine, Ser: 4.58 mg/dL — ABNORMAL HIGH (ref 0.44–1.00)
GFR, Estimated: 10 mL/min — ABNORMAL LOW (ref >60)
Glucose, Bld: 119 mg/dL — ABNORMAL HIGH (ref 70–99)
Potassium: 3 mmol/L — ABNORMAL LOW (ref 3.5–5.1)
Sodium: 140 mmol/L (ref 135–145)

## 2024-01-20 LAB — PHOSPHORUS: Phosphorus: 3.5 mg/dL (ref 2.5–4.6)

## 2024-01-20 LAB — MAGNESIUM: Magnesium: 2.3 mg/dL (ref 1.7–2.4)

## 2024-01-20 SURGERY — TEMPORARY DIALYSIS CATHETER
Anesthesia: Moderate Sedation

## 2024-01-20 MED ORDER — BIOTENE DRY MOUTH MT LIQD: 15.0000 mL | OROMUCOSAL | Status: DC | PRN

## 2024-01-20 MED ORDER — GLYCOPYRROLATE 0.2 MG/ML IJ SOLN: 0.2000 mg | INTRAMUSCULAR | Status: DC | PRN

## 2024-01-20 MED ORDER — GLYCOPYRROLATE 1 MG PO TABS: 1.0000 mg | ORAL_TABLET | ORAL | Status: DC | PRN

## 2024-01-20 MED ORDER — HALOPERIDOL LACTATE 5 MG/ML IJ SOLN: 0.5000 mg | INTRAMUSCULAR | Status: DC | PRN

## 2024-01-20 MED ORDER — POLYVINYL ALCOHOL 1.4 % OP SOLN: 1.0000 [drp] | Freq: Four times a day (QID) | OPHTHALMIC | Status: DC | PRN

## 2024-01-20 MED ORDER — GLYCOPYRROLATE 0.2 MG/ML IJ SOLN
0.2000 mg | INTRAMUSCULAR | Status: DC | PRN
  Filled 2024-01-20: qty 1

## 2024-01-20 MED ORDER — COLLAGENASE 250 UNIT/GM EX OINT
TOPICAL_OINTMENT | Freq: Every day | CUTANEOUS | Status: DC
  Administered 2024-01-20: 1 via TOPICAL
  Filled 2024-01-20: qty 30

## 2024-01-20 MED ORDER — HALOPERIDOL LACTATE 2 MG/ML PO CONC: 0.5000 mg | ORAL | Status: DC | PRN

## 2024-01-20 MED ORDER — HALOPERIDOL 0.5 MG PO TABS: 0.5000 mg | ORAL_TABLET | ORAL | Status: DC | PRN

## 2024-01-20 NOTE — Progress Notes (Signed)
 [facility] Liaison Note  Received a referral from [facility], Lauraine Carpen, LCSW, to evaluate patient for the [facility].  Message left with patient's sister asking that she call to discuss IPU process. Awaiting a return call.  Please call with any hospice related questions or concerns  Thank you for the opportunity to participate in this patient's care  [facility] Liaison 336 [phone]

## 2024-01-20 NOTE — Progress Notes (Signed)
 [facility] Progress Note  Patient: Sherry [name]    FMW:[id number]  DOA: 01/15/2024     Date of Service: the patient was seen and examined on 01/20/2024  Chief Complaint  Patient presents with   Altered Mental Status   Brief hospital course: Sherry [name] is a 68 year old female patient with a past medical history of end-stage renal disease on HD MWF via right tunneled cath, seizure disorder, chronic hepatitis C, opioid use disorder, stroke, status post gastrostomy tube, hypertension, diastolic congestive heart failure, depression with anxiety, bipolar, breast cancer status post right mastectomy presenting to [facility] on 07/05 for confusion and altered mental status and hypotension.   She received dialysis earlier today and was reportedly described as being somnolent throughout the dialysis session.  She went back to her rehab and was found to be confused and unresponsive by the team and therefore EMS were called.  On arrival EMS she was found to have systolics in the 80s and therefore she was brought in to [facility] ED.   Of note, she was recently admitted to [facility] on 06/04 to 06/13 with E. coli bacteremia and signs of cholangitis.  She underwent ERCP on 06/06 and was noted to have choledocholithiasis, sphincterotomy was done and a plastic stent was placed.   In the ED, she was found to be hypotensive with MAP low 60s, she received 1 L normal saline bolus with good response to fluids.  She was started on broad-spectrum antibiotics with vancomycin  and aztreonam  and Flagyl .   Labs: Lactate 6.1 WBC 21.6 with 94% neutrophils, hemoglobin 9.1 and hematocrit 29.7, platelets 434. Electrolyte with CO2 21, BUN 33 and creatinine 3.1.  Potassium 3.7.  Chest x-ray without any acute process.  01/15/2024 admitted to the ICU.  Patient was given IV fluid, blood pressure stable, did not require pressor support.  Downgraded under [facility] service on 01/16/2024   7/10 after extensive discussion with family, patient was  transitioned to comfort measures only.  Palliative care following, hospice was notified.  Patient will be evaluated by hospice team tomorrow a.m., follow TOC and hospice team for DC plan   Assessment and Plan:  Comfort measures only started on 7/10 Due to poor prognosis palliative care was involved and extensive discussion was done with family with me and neurologist.  Finally family agreed for comfort measures. Continue comfort care medications Follow hospice for DC plan  During hospital stay patient was managed as below. _____________________________________________   # Metabolic encephalopathy due to sepsis Continue supportive care, fall precautions, aspiration precaution Continue to treat as below   # Sepsis, multifactorial could be due to C. difficile and E. coli bacteremia, decub osteomyelitis and liver abscess H/o recent instrumentation with ERCP sphincterotomy and stent placement (12/17/2023)  Seen by GI recommended no need of repeat ERCP, may repeat MRI to look at the liver lesion. S/p vancomycin  and cefepime  Blood culture growing E. coli, continue ceftriaxone  Stool positive for C. difficile, started vancomycin  enteric via G-tube Vital signs stable, no need of vasopressors Transferred to progressive unit Follow ID for further recommendation  # C. difficile gastroenteritis 7/6 started  vancomycin  125 mg p.o. Q6 hourly for 10 days Started probiotics   # Liver abscess GI consulted, recommended MRI versus CT scan with contrast IR consulted, s/p Two aspirations of two separte right lobe liver fluid collections.  Samples obtained and sent to Pathology. Done on 7/8, follow fluid culture report Follow ID consult   # Decub osteomyelitis General Surgery consulted, recommended no intervention, less chances  of healing Turn patient every 2 hours Follow ID for antibiotics   # ESRD on hemodialysis, MWF schedule Right tunneled catheter need to be removed due to E. coli  bacteremia Nephrology consulted vascular surgery to remove removed tunneled HD catheter, which was removed on 7/8 Follow nephro for further recommendation 7/8 Follow catheter tip culture   # Seizure disorder Continue Keppra  500 twice daily clobazam  5 mg twice daily Vimpat  200 mg twice daily EEG: generalized periodic discharges with triphasic morphology which can be on the ictal-interictal continuum. could also be secondary to toxic-metabolic causes. Additionally there is severe diffuse encephalopathy. No definite seizures were noted.  7/8 d/w neurology, recommended continue current treatment, will repeat EEG and brain imaging tomorrow a.m. Overall poor prognosis, recommended palliative care. 7/9 MRI brain possible acute subacute stroke, and repeat EEG shows worsening of triphasic morphology consistent with ictal interictal continuum. Discussed with neurology, recommended comfort care due to poor prognosis.  # h/o CVA, resumed ASA  # Depression and bipolar disorder Resumed Haldol  and Zoloft  home dose  # HTN, diastolic CHF Continue to monitor BP and titrate medications accordingly Presented with sepsis, we will continue to monitor closely   # History of chronic hep C Follow-up with GI as an outpatient # History of breast cancer, no active issues, follow-up with oncology as an outpatient  # Iron deficiency, Tsat 4% Patient will benefit from IV iron infusion, cannot be given now due to acute infection.  Patient can be evaluated as an outpatient for IV iron transfusion Start oral iron supplement on discharge with vitamin C    Body mass index is 24.27 kg/m.  Interventions:   Diet: G-tube feeding DVT Prophylaxis: Subcutaneous Heparin     Advance goals of care discussion: DNR/DNI-Limited  Family Communication: family was present at bedside, at the time of interview.  Patient remained encephalopathic, unable to communicate. 7/8 goals of care discussed with family, management plan  discussed in details. Family agreed to change CODE STATUS to DNR/DNI.   Disposition:  Pt is from SNF, admitted with sepsis, still on IV antibiotic, which precludes a safe discharge. Discharge to SNF, when stable, overall prognosis is poor, palliative care consulted, patient may be appropriate for hospice care. Need more discussion with family for comfort care.   Subjective: No significant events overnight, patient remained obtunded and encephalopathic.  No response to painful stimuli. Blinking eyes spontaneously.   Physical Exam: General: NAD, lying comfortably, remained encephalopathic Eyes: Closed  ENT: Oral Mucosa dry   Neck: no JVD,  Cardiovascular: S1 and S2 Present, no Murmur,  Respiratory: Equal air entry bilaterally, conducting sounds due to oral secretions, no wheezes Abdomen: Bowel Sound present, Soft and no tenderness,  Extremities: no Pedal edema Neurologic: Remained encephalopathic, unable to respond to painful stimuli  Vitals:   01/20/24 0015 01/20/24 0423 01/20/24 0500 01/20/24 0732  BP: 119/75 (!) 141/65  116/62  Pulse: 80 78  77  Resp: 20 16    Temp: 98.5 F (36.9 C) 98.6 F (37 C)  99.3 F (37.4 C)  TempSrc:    Oral  SpO2: 95% 100%  97%  Weight:   60.2 kg   Height:        Intake/Output Summary (Last 24 hours) at 01/20/2024 1834 Last data filed at 01/20/2024 0600 Gross per 24 hour  Intake 2345.5 ml  Output --  Net 2345.5 ml   Filed Weights   01/18/24 0500 01/19/24 0347 01/20/24 0500  Weight: 55.3 kg 55.2 kg 60.2 kg  Data Reviewed: I have personally reviewed and interpreted daily labs, tele strips, imagings as discussed above. I reviewed all nursing notes, pharmacy notes, vitals, pertinent old records I have discussed plan of care as described above with RN and patient/family.  CBC: Recent Labs  Lab 01/15/24 1300 01/16/24 0419 01/17/24 0226 01/18/24 0405 01/18/24 1249 01/19/24 0333 01/20/24 0321  WBC 21.6*   < > 20.8* 20.8* 16.5*  15.1* 9.5  NEUTROABS 20.3*  --   --   --   --   --   --   HGB 9.1*   < > 7.4* 7.2* 7.5* 7.0* 8.6*  HCT 29.7*   < > 23.8* 23.5* 24.4* 23.3* 26.7*  MCV 93.4   < > 90.5 92.2 93.8 93.6 89.6  PLT 434*   < > 371 424* 366 412* 350   < > = values in this interval not displayed.   Basic Metabolic Panel: Recent Labs  Lab 01/16/24 0419 01/17/24 0226 01/18/24 0405 01/19/24 0333 01/20/24 0321  NA 135 136 136 136 140  K 3.7 3.8 3.6 3.3* 3.0*  CL 97* 98 96* 102 99  CO2 20* 21* 21* 18* 21*  GLUCOSE 104* 80 119* 115* 119*  BUN 45* 63* 82* 90* 101*  CREATININE 3.14* 3.78* 4.06* 4.40* 4.58*  CALCIUM  8.2* 8.5* 8.4* 8.4* 8.5*  MG 2.1 2.3 2.4 2.2 2.3  PHOS 4.7* 5.3* 4.8* 4.1 3.5    Studies: No results found.    Scheduled Meds:  ascorbic acid   500 mg Per Tube BID   aspirin   81 mg Per Tube Daily   Chlorhexidine  Gluconate Cloth  6 each Topical Daily   cloBAZam   5 mg Per Tube Q12H   collagenase    Topical Daily   [START ON 01/22/2024] haloperidol   1 mg Per Tube Once per day on Tuesday Saturday   lacosamide   200 mg Per Tube BID   levETIRAcetam   500 mg Per Tube BID   lidocaine  (PF)  5 mL Subcutaneous Once   liver oil-zinc  oxide   Topical BID   pantoprazole  (PROTONIX ) IV  40 mg Intravenous QHS   saccharomyces boulardii  250 mg Per Tube BID   sertraline   25 mg Per Tube Daily   thiamine   100 mg Per Tube Daily   white petrolatum    Topical BID   zinc  sulfate (50mg  elemental zinc )  220 mg Per Tube Daily   Continuous Infusions:   PRN Meds: acetaminophen , antiseptic oral rinse, artificial tears, docusate, glycopyrrolate  **OR** glycopyrrolate  **OR** glycopyrrolate , haloperidol  **OR** haloperidol  **OR** haloperidol  lactate, HYDROmorphone  (DILAUDID ) injection, HYDROmorphone , hydrOXYzine , LORazepam , ondansetron  (ZOFRAN ) IV, mouth rinse, polyethylene glycol  Time spent: 55 minutes  Author: ELVAN SOR. MD [facility] 01/20/2024 6:34 PM  To reach On-call, see care teams to locate the  attending and reach out to them via [email or web address]. If 7PM-7AM, please contact night-coverage If you still have difficulty reaching the attending provider, please page the [facility] (Director on Call) for [facility] on amion for assistance.

## 2024-01-20 NOTE — Progress Notes (Addendum)
 Pt has gram positive rods in blood culture- lab is identifying the bacteria She is now comfort care  01/21/24  Blood culture resulted as enterococcus faecium likely from the stage IV sacral wound or from the biliary obstruction liver abscesses Culture sent from wound

## 2024-01-20 NOTE — Progress Notes (Signed)
 PHARMACY - PHYSICIAN COMMUNICATION CRITICAL VALUE ALERT - BLOOD CULTURE IDENTIFICATION (BCID)  Sherry [name] is an 68 y.o. female who presented to [facility] on 01/15/2024 with E coli bacteremia  Assessment:  Repeat blood cultures from 7/9 with GPR in 1 bottle of each set.    Name of physician (or Provider) Contacted: Dr Fayette  Current antibiotics: Ceftriaxone  and metronidazole   Changes to prescribed antibiotics recommended:  Patient is on recommended antibiotics - No changes needed - goals of care discussions - moving toward comfort care  [name], PharmD, BCPS, BCIDP Work Cell: [phone] 01/20/2024 1:49 PM

## 2024-01-20 NOTE — Plan of Care (Signed)
  Problem: Nutrition: Goal: Adequate nutrition will be maintained Outcome: Progressing   Problem: Pain Managment: Goal: General experience of comfort will improve and/or be controlled Outcome: Progressing   Problem: Safety: Goal: Ability to remain free from injury will improve Outcome: Progressing   Problem: Education: Goal: Knowledge of General Education information will improve Description: Including pain rating scale, medication(s)/side effects and non-pharmacologic comfort measures Outcome: Not Progressing   Problem: Health Behavior/Discharge Planning: Goal: Ability to manage health-related needs will improve Outcome: Not Progressing

## 2024-01-20 NOTE — Progress Notes (Signed)
 Progress Note    01/20/2024 9:22 AM 2 Days Post-Op  Subjective:   Sherry [name] is a 68 y.o. female y with history of ESRD on hemodialysis, HFpEF, bipolar, opioid use disorder, hypertension, Hep c (with cure, no evidence of cirrhosis), and arthritis here with e. Coli bacteremia of unknown source.  Patient underwent right chest dialysis permacath removal on 01/18/2024.  Patient is resting comfortably in bed this morning.  No complications overnight and vitals all remained stable.   Vascular surgery was called this morning and asked to place a temporary dialysis catheter for hemodialysis.  Patient is now oliguric and requires hemodialysis.  Vitals:   01/20/24 0423 01/20/24 0732  BP: (!) 141/65 116/62  Pulse: 78 77  Resp: 16   Temp: 98.6 F (37 C) 99.3 F (37.4 C)  SpO2: 100% 97%   Physical Exam: Cardiac:  RRR, No murmurs. Lungs:  good respiratory effort, Bilateral Air entry equal and Decreased, no Crackles, no wheezes  Incisions:  Right Chest. Dressing clean dry and intact.  Extremities:  Palpable pulses throughout. Abdomen:  Positive bowel sounds throughout, soft and non tender and non distended.  Neurologic: Not responsive to me this morning.   CBC    Component Value Date/Time   WBC 9.5 01/20/2024 0321   RBC 2.98 (L) 01/20/2024 0321   HGB 8.6 (L) 01/20/2024 0321   HGB 10.2 (L) 07/25/2020 1104   HCT 26.7 (L) 01/20/2024 0321   HCT 30.8 (L) 07/25/2020 1104   PLT 350 01/20/2024 0321   PLT 258 07/25/2020 1104   MCV 89.6 01/20/2024 0321   MCV 85 07/25/2020 1104   MCH 28.9 01/20/2024 0321   MCHC 32.2 01/20/2024 0321   RDW 16.8 (H) 01/20/2024 0321   RDW 16.4 (H) 07/25/2020 1104   LYMPHSABS 0.6 (L) 01/15/2024 1300   LYMPHSABS 1.9 11/08/2017 1430   MONOABS 0.5 01/15/2024 1300   EOSABS 0.0 01/15/2024 1300   EOSABS 0.2 11/08/2017 1430   BASOSABS 0.0 01/15/2024 1300   BASOSABS 0.0 11/08/2017 1430    BMET    Component Value Date/Time   NA 140 01/20/2024 0321   NA 141  07/25/2020 1104   K 3.0 (L) 01/20/2024 0321   CL 99 01/20/2024 0321   CO2 21 (L) 01/20/2024 0321   GLUCOSE 119 (H) 01/20/2024 0321   BUN 101 (H) 01/20/2024 0321   BUN 41 (H) 07/25/2020 1104   CREATININE 4.58 (H) 01/20/2024 0321   CALCIUM  8.5 (L) 01/20/2024 0321   GFRNONAA 10 (L) 01/20/2024 0321   GFRAA 18 (L) 07/25/2020 1104    INR    Component Value Date/Time   INR 1.1 01/15/2024 1300     Intake/Output Summary (Last 24 hours) at 01/20/2024 9077 Last data filed at 01/20/2024 0600 Gross per 24 hour  Intake 2345.5 ml  Output --  Net 2345.5 ml     Assessment/Plan:  68 y.o. female with a history of ESRD on hemodialysis is post dialysis permacath removal on 01/18/2024 for bacteremia.  Patient now needs temporary dialysis catheter in place for hemodialysis.  2 Days Post-Op   I contacted the patient's sister Mercer Hint her power of attorney this morning.  We discussed in detail placement of a temporary dialysis catheter for hemodialysis.  She agrees to move forward with that as she understands her sister does not need hemodialysis.  We discussed in detail the procedure, benefits, risk, and complications.  She understands and wishes to proceed.  Patient does not need to be n.p.o. for this  procedure.  Currently patient has tube feeding running.  There is active today.  I discussed the case in detail with Dr. Selinda Gu MD and he agrees with the plan.    Gwendlyn JONELLE [facility] 01/20/2024 9:22 AM

## 2024-01-20 NOTE — Progress Notes (Signed)
 [facility]  ROUNDING NOTE   Subjective:   [name] was admitted to [facility] on 01/15/2024 for Sepsis [facility]) [A41.9]  Patient received her complete hemodialysis treatment on day of admission which was recently lowered to 3 hours. Patient was premedicated with haloperidol , gabapentin  and oxycodone  before her treatment.   Update Patient seen laying in bed No family present Very somnolent  BUN 101   Objective:  Vital signs in last 24 hours:  Temp:  [98.2 F (36.8 C)-99.9 F (37.7 C)] 99.3 F (37.4 C) (07/10 0732) Pulse Rate:  [75-93] 77 (07/10 0732) Resp:  [16-20] 16 (07/10 0423) BP: (116-146)/(62-75) 116/62 (07/10 0732) SpO2:  [95 %-100 %] 97 % (07/10 0732) Weight:  [60.2 kg] 60.2 kg (07/10 0500)  Weight change: 5 kg Filed Weights   01/18/24 0500 01/19/24 0347 01/20/24 0500  Weight: 55.3 kg 55.2 kg 60.2 kg    Intake/Output: I/O last 3 completed shifts: In: 2870.4 [I.V.:524.9; Blood:358; NG/GT:1787.5; IV Piggyback:200] Out: -    Intake/Output this shift:  No intake/output data recorded.  Physical Exam: General: Ill appearing  Head: Normocephalic, atraumatic. Moist oral mucosal membranes  Eyes: Anicteric  Lungs:  Clear to auscultation, room air  Heart: Regular rate and rhythm  Abdomen:  Soft, nontender  Extremities:  Dependent bilateral hand peripheral edema.  Neurologic: Somnolent.   Skin: No lesions  Access: RIJ permcath    Basic Metabolic Panel: Recent Labs  Lab 01/16/24 0419 01/17/24 0226 01/18/24 0405 01/19/24 0333 01/20/24 0321  NA 135 136 136 136 140  K 3.7 3.8 3.6 3.3* 3.0*  CL 97* 98 96* 102 99  CO2 20* 21* 21* 18* 21*  GLUCOSE 104* 80 119* 115* 119*  BUN 45* 63* 82* 90* 101*  CREATININE 3.14* 3.78* 4.06* 4.40* 4.58*  CALCIUM  8.2* 8.5* 8.4* 8.4* 8.5*  MG 2.1 2.3 2.4 2.2 2.3  PHOS 4.7* 5.3* 4.8* 4.1 3.5    Liver Function Tests: Recent Labs  Lab 01/15/24 1300 01/16/24 0419 01/17/24 0226 01/18/24 0405 01/19/24 0333  01/20/24 0321  AST 89*  --  67* 46* 49* 58*  ALT 26  --  23 18 18 19   ALKPHOS 862*  --  532* 489* 564* 632*  BILITOT 0.7  --  0.9 0.7 0.8 0.8  PROT 7.4  --  5.8* 6.3* 6.4* 6.0*  ALBUMIN 2.3* 2.1* 2.0* 2.0* 1.9* 1.9*   Recent Labs  Lab 01/15/24 1300  LIPASE 32   No results for input(s): AMMONIA in the last 168 hours.  CBC: Recent Labs  Lab 01/15/24 1300 01/16/24 0419 01/17/24 0226 01/18/24 0405 01/18/24 1249 01/19/24 0333 01/20/24 0321  WBC 21.6*   < > 20.8* 20.8* 16.5* 15.1* 9.5  NEUTROABS 20.3*  --   --   --   --   --   --   HGB 9.1*   < > 7.4* 7.2* 7.5* 7.0* 8.6*  HCT 29.7*   < > 23.8* 23.5* 24.4* 23.3* 26.7*  MCV 93.4   < > 90.5 92.2 93.8 93.6 89.6  PLT 434*   < > 371 424* 366 412* 350   < > = values in this interval not displayed.    Cardiac Enzymes: No results for input(s): CKTOTAL, CKMB, CKMBINDEX, TROPONINI in the last 168 hours.  BNP: Invalid input(s): POCBNP  CBG: Recent Labs  Lab 01/19/24 2019 01/20/24 0014 01/20/24 0403 01/20/24 0733 01/20/24 1153  GLUCAP 107* 114* 110* 110* 148*    Microbiology: Results for orders placed or performed  during the hospital encounter of 01/15/24  Blood Culture (routine x 2)     Status: Abnormal   Collection Time: 01/15/24 12:42 PM   Specimen: BLOOD  Result Value Ref Range Status   Specimen Description   Final    BLOOD RIGHT ANTECUBITAL Performed at [facility], [address] [place], [place] [address]    Special Requests   Final    BOTTLES DRAWN AEROBIC AND ANAEROBIC Blood Culture results may not be optimal due to an inadequate volume of blood received in culture bottles Performed at [facility], [address] [place], [place] [address]    Culture  Setup Time   Final    GRAM NEGATIVE RODS AEROBIC BOTTLE ONLY CRITICAL VALUE NOTED.  VALUE IS CONSISTENT WITH PREVIOUSLY REPORTED AND CALLED VALUE. Performed at [facility], [address] [place], [place]  [address]    Culture (A)  Final    ESCHERICHIA COLI SUSCEPTIBILITIES PERFORMED ON PREVIOUS CULTURE WITHIN THE LAST 5 DAYS. Performed at [facility], [address] [place], [place] [address]    Report Status 01/18/2024 FINAL  Final  Blood Culture (routine x 2)     Status: Abnormal   Collection Time: 01/15/24 12:42 PM   Specimen: BLOOD  Result Value Ref Range Status   Specimen Description   Final    BLOOD LEFT ANTECUBITAL Performed at [facility], [address] [place], [place] [address]    Special Requests   Final    BOTTLES DRAWN AEROBIC AND ANAEROBIC Blood Culture results may not be optimal due to an inadequate volume of blood received in culture bottles Performed at [facility], [address] [place], [place] [address]    Culture  Setup Time   Final    GRAM NEGATIVE RODS AEROBIC BOTTLE ONLY CRITICAL RESULT CALLED TO, READ BACK BY AND VERIFIED WITH: JASON ROBBINS, PHARMD @0132  01/16/2024 COP Performed at [facility], [address], [place], [place] [address]    Culture ESCHERICHIA COLI (A)  Final   Report Status 01/18/2024 FINAL  Final   Organism ID, Bacteria ESCHERICHIA COLI  Final   Organism ID, Bacteria ESCHERICHIA COLI  Final      Susceptibility   Escherichia coli - KIRBY BAUER*    CEFAZOLIN  INTERMEDIATE Intermediate    Escherichia coli - MIC*    AMPICILLIN >=32 RESISTANT Resistant     CEFEPIME  <=0.12 SENSITIVE Sensitive     CEFTAZIDIME <=1 SENSITIVE Sensitive     CEFTRIAXONE  <=0.25 SENSITIVE Sensitive     CIPROFLOXACIN >=4 RESISTANT Resistant     GENTAMICIN <=1 SENSITIVE Sensitive     IMIPENEM <=0.25 SENSITIVE Sensitive     TRIMETH /SULFA  <=20 SENSITIVE Sensitive     AMPICILLIN/SULBACTAM >=32 RESISTANT Resistant     PIP/TAZO <=4 SENSITIVE Sensitive ug/mL    * ESCHERICHIA COLI    ESCHERICHIA COLI  Blood Culture ID Panel (Reflexed)     Status: Abnormal   Collection Time: 01/15/24 12:42 PM  Result Value Ref Range Status    Enterococcus faecalis NOT DETECTED NOT DETECTED Final   Enterococcus Faecium NOT DETECTED NOT DETECTED Final   Listeria monocytogenes NOT DETECTED NOT DETECTED Final   Staphylococcus species NOT DETECTED NOT DETECTED Final   Staphylococcus aureus (BCID) NOT DETECTED NOT DETECTED Final   Staphylococcus epidermidis NOT DETECTED NOT DETECTED Final   Staphylococcus lugdunensis NOT DETECTED NOT DETECTED Final   Streptococcus species NOT DETECTED NOT DETECTED Final   Streptococcus agalactiae NOT DETECTED  NOT DETECTED Final   Streptococcus pneumoniae NOT DETECTED NOT DETECTED Final   Streptococcus pyogenes NOT DETECTED NOT DETECTED Final   A.calcoaceticus-baumannii NOT DETECTED NOT DETECTED Final   Bacteroides fragilis NOT DETECTED NOT DETECTED Final   Enterobacterales DETECTED (A) NOT DETECTED Final    Comment: Enterobacterales represent a large order of gram negative bacteria, not a single organism. CRITICAL RESULT CALLED TO, READ BACK BY AND VERIFIED WITH: JASON ROBBINS, PHARMD @0132  01/16/2024 COP    Enterobacter cloacae complex NOT DETECTED NOT DETECTED Final   Escherichia coli DETECTED (A) NOT DETECTED Final    Comment: CRITICAL RESULT CALLED TO, READ BACK BY AND VERIFIED WITH: JASON ROBBINS, PHARMD @0132  01/16/2024 COP    Klebsiella aerogenes NOT DETECTED NOT DETECTED Final   Klebsiella oxytoca NOT DETECTED NOT DETECTED Final   Klebsiella pneumoniae NOT DETECTED NOT DETECTED Final   Proteus species NOT DETECTED NOT DETECTED Final   Salmonella species NOT DETECTED NOT DETECTED Final   Serratia marcescens NOT DETECTED NOT DETECTED Final   Haemophilus influenzae NOT DETECTED NOT DETECTED Final   Neisseria meningitidis NOT DETECTED NOT DETECTED Final   Pseudomonas aeruginosa NOT DETECTED NOT DETECTED Final   Stenotrophomonas maltophilia NOT DETECTED NOT DETECTED Final   Candida albicans NOT DETECTED NOT DETECTED Final   Candida auris NOT DETECTED NOT DETECTED Final   Candida glabrata  NOT DETECTED NOT DETECTED Final   Candida krusei NOT DETECTED NOT DETECTED Final   Candida parapsilosis NOT DETECTED NOT DETECTED Final   Candida tropicalis NOT DETECTED NOT DETECTED Final   Cryptococcus neoformans/gattii NOT DETECTED NOT DETECTED Final   CTX-M ESBL NOT DETECTED NOT DETECTED Final   Carbapenem resistance IMP NOT DETECTED NOT DETECTED Final   Carbapenem resistance KPC NOT DETECTED NOT DETECTED Final   Carbapenem resistance NDM NOT DETECTED NOT DETECTED Final   Carbapenem resist OXA 48 LIKE NOT DETECTED NOT DETECTED Final   Carbapenem resistance VIM NOT DETECTED NOT DETECTED Final    Comment: Performed at [facility], [address] [place], [place] [address]  MRSA Next Gen by PCR, Nasal     Status: None   Collection Time: 01/15/24  3:40 PM   Specimen: Nasal Mucosa; Nasal Swab  Result Value Ref Range Status   MRSA by PCR Next Gen NOT DETECTED NOT DETECTED Final    Comment: (NOTE) The GeneXpert MRSA Assay (FDA approved for NASAL specimens only), is one component of a comprehensive MRSA colonization surveillance program. It is not intended to diagnose MRSA infection nor to guide or monitor treatment for MRSA infections. Test performance is not FDA approved in patients less than 66 years old. Performed at [facility], [address] [place], [place] [address]   C Difficile Quick Screen w PCR reflex     Status: Abnormal   Collection Time: 01/16/24 10:32 AM   Specimen: Stool  Result Value Ref Range Status   C Diff antigen POSITIVE (A) NEGATIVE Final   C Diff toxin NEGATIVE NEGATIVE Final   C Diff interpretation Results are indeterminate. See PCR results.  Final    Comment: Performed at [facility], [address] [place], [place] [address]  Gastrointestinal Panel by PCR , Stool     Status: None   Collection Time: 01/16/24 10:32 AM   Specimen: Stool  Result Value Ref Range Status   Campylobacter species NOT DETECTED NOT DETECTED Final    Plesimonas shigelloides NOT DETECTED NOT DETECTED Final   Salmonella species NOT DETECTED NOT DETECTED Final  Yersinia enterocolitica NOT DETECTED NOT DETECTED Final   Vibrio species NOT DETECTED NOT DETECTED Final   Vibrio cholerae NOT DETECTED NOT DETECTED Final   Enteroaggregative E coli (EAEC) NOT DETECTED NOT DETECTED Final   Enteropathogenic E coli (EPEC) NOT DETECTED NOT DETECTED Final   Enterotoxigenic E coli (ETEC) NOT DETECTED NOT DETECTED Final   Shiga like toxin producing E coli (STEC) NOT DETECTED NOT DETECTED Final   Shigella/Enteroinvasive E coli (EIEC) NOT DETECTED NOT DETECTED Final   Cryptosporidium NOT DETECTED NOT DETECTED Final   Cyclospora cayetanensis NOT DETECTED NOT DETECTED Final   Entamoeba histolytica NOT DETECTED NOT DETECTED Final   Giardia lamblia NOT DETECTED NOT DETECTED Final   Adenovirus F40/41 NOT DETECTED NOT DETECTED Final   Astrovirus NOT DETECTED NOT DETECTED Final   Norovirus GI/GII NOT DETECTED NOT DETECTED Final   Rotavirus A NOT DETECTED NOT DETECTED Final   Sapovirus (I, II, IV, and V) NOT DETECTED NOT DETECTED Final    Comment: Performed at [facility], [address] [place], [place] [address]  C. Diff by PCR, Reflexed     Status: Abnormal   Collection Time: 01/16/24 10:32 AM  Result Value Ref Range Status   Toxigenic C. Difficile by PCR POSITIVE (A) NEGATIVE Final    Comment: Positive for toxigenic C. difficile with little to no toxin production. Only treat if clinical presentation suggests symptomatic illness. Performed at [facility], [address], [place], [place] [address]   Cath Tip Culture     Status: None (Preliminary result)   Collection Time: 01/18/24  8:45 AM   Specimen: Catheter Tip; Other  Result Value Ref Range Status   Specimen Description   Final    CATH TIP Performed at [facility], [address] [place], [place] [address]    Special Requests   Final    NONE Performed at  [facility], [address], [place], [place] [address]    Culture   Final    NO GROWTH 2 DAYS Performed at [facility], [address], [place], [place] [address]    Report Status PENDING  Incomplete  Aerobic/Anaerobic Culture w Gram Stain (surgical/deep wound)     Status: None (Preliminary result)   Collection Time: 01/18/24 11:05 AM   Specimen: Liver; Abscess  Result Value Ref Range Status   Specimen Description   Final    LIVER 1 Performed at [facility], [address] [place], [place] [address]    Special Requests   Final    NONE Performed at [facility], [address] [place], [place] [address]    Gram Stain NO WBC SEEN NO ORGANISMS SEEN   Final   Culture   Final    NO GROWTH 2 DAYS Performed at [facility], [address] [place], [place] [address]    Report Status PENDING  Incomplete  Aerobic/Anaerobic Culture w Gram Stain (surgical/deep wound)     Status: None (Preliminary result)   Collection Time: 01/18/24 11:06 AM   Specimen: Liver; Abscess  Result Value Ref Range Status   Specimen Description   Final    LIVER 2 Performed at [facility], [address] [place], [place] [address]    Special Requests   Final    NONE Performed at [facility], [address] [place], [place] [address]    Gram Stain   Final    ABUNDANT WBC PRESENT, PREDOMINANTLY PMN RARE VONNE  NEGATIVE RODS Performed at [facility], [address], [place], [place] [address]    Culture RARE ESCHERICHIA COLI  Final   Report Status PENDING  Incomplete   Organism ID, Bacteria ESCHERICHIA COLI  Final      Susceptibility   Escherichia coli - MIC*    AMPICILLIN >=32 RESISTANT Resistant     CEFEPIME  <=0.12 SENSITIVE Sensitive     CEFTAZIDIME <=1 SENSITIVE Sensitive     CEFTRIAXONE  <=0.25 SENSITIVE Sensitive     CIPROFLOXACIN >=4 RESISTANT Resistant     GENTAMICIN <=1 SENSITIVE Sensitive     IMIPENEM <=0.25  SENSITIVE Sensitive     TRIMETH /SULFA  <=20 SENSITIVE Sensitive     AMPICILLIN/SULBACTAM >=32 RESISTANT Resistant     PIP/TAZO <=4 SENSITIVE Sensitive ug/mL    * RARE ESCHERICHIA COLI  Culture, blood (Routine X 2) w Reflex to ID Panel     Status: None (Preliminary result)   Collection Time: 01/19/24 12:32 PM   Specimen: BLOOD  Result Value Ref Range Status   Specimen Description BLOOD BLOOD RIGHT HAND  Final   Special Requests   Final    BOTTLES DRAWN AEROBIC AND ANAEROBIC Blood Culture adequate volume   Culture  Setup Time   Final    GRAM POSITIVE RODS ANAEROBIC BOTTLE ONLY CRITICAL VALUE NOTED.  VALUE IS CONSISTENT WITH PREVIOUSLY REPORTED AND CALLED VALUE. Performed at [facility], [address] [place], [place] [address]    Culture PENDING  Incomplete   Report Status PENDING  Incomplete  Culture, blood (Routine X 2) w Reflex to ID Panel     Status: None (Preliminary result)   Collection Time: 01/19/24  1:14 PM   Specimen: BLOOD  Result Value Ref Range Status   Specimen Description BLOOD BLOOD LEFT HAND  Final   Special Requests   Final    BOTTLES DRAWN AEROBIC AND ANAEROBIC Blood Culture results may not be optimal due to an inadequate volume of blood received in culture bottles   Culture  Setup Time   Final    GRAM POSITIVE RODS AEROBIC BOTTLE ONLY CRITICAL RESULT CALLED TO, READ BACK BY AND VERIFIED WITH: LISA KLUTTZ 1138 01/19/14 TDL Performed at [facility], [address] [place], [place] [address]    Culture PENDING  Incomplete   Report Status PENDING  Incomplete    Coagulation Studies: No results for input(s): LABPROT, INR in the last 72 hours.   Urinalysis: No results for input(s): COLORURINE, LABSPEC, PHURINE, GLUCOSEU, HGBUR, BILIRUBINUR, KETONESUR, PROTEINUR, UROBILINOGEN, NITRITE, LEUKOCYTESUR in the last 72 hours.  Invalid input(s): APPERANCEUR    Imaging: EEG adult Result Date: 01/19/2024 Shelton Arlin KIDD, MD     01/19/2024  1:42 PM Patient Name: Sherry [name] MRN: [id number] Epilepsy Attending: Arlin KIDD Shelton Referring Physician/Provider: Voncile Isles, MD Date: 01/19/2024 Duration: 32.28 mins  Patient history: 68yo F with ams. EEG to evaluate for seizure  Level of alertness: comatose  AEDs during EEG study: LEV, LCM, Onfi   Technical aspects: This EEG study was done with scalp electrodes positioned according to the 10-20 International system of electrode placement. Electrical activity was reviewed with band pass filter of 1-70Hz , sensitivity of 7 uV/mm, display speed of 32mm/sec with a 60Hz  notched filter applied as appropriate. EEG data were recorded continuously and digitally stored.  Video monitoring was available and reviewed as appropriate.  Description: EEG showed near continuous generalized 2 to 3 Hz delta slowing admixed with 1-3 seconds of generalized EEG attenuation.  Generalized periodic  discharges with triphasic morphology were also noted at 0.5 to 1.5 Hz. Hyperventilation and photic stimulation were not performed.    ABNORMALITY - Periodic discharges with triphasic morphology, generalized -Continuous slow, generalized -Background condition, generalized   IMPRESSION: This study showed generalized periodic discharges with triphasic morphology which can be on the ictal-interictal continuum.  However given the morphology and frequency, could also be secondary to toxic-metabolic causes.  Additionally there is severe diffuse encephalopathy.  No definite seizures were noted.  EEG appears slightly worse compared to previous study due to longer periods of eeg attenuations.   Arlin MALVA Krebs    MR BRAIN WO CONTRAST Result Date: 01/19/2024 CLINICAL DATA:  Provided history: Seizure disorder, clinical change. EXAM: MRI HEAD WITHOUT CONTRAST TECHNIQUE: Multiplanar, multiecho pulse sequences of the brain and surrounding structures were obtained without intravenous contrast. COMPARISON:  Head CT 01/15/2024.   Brain MRI 09/20/2023. FINDINGS: A routine protocol noncontrast brain MRI was performed. Brain: Stable cerebral atrophy. Small ill-defined foci of diffusion-weighted signal abnormality within the cortical/subcortical right occipital lobe and left occipital lobe white matter, likely reflecting acute/subacute infarcts. Small chronic cortically-based infarcts again demonstrated within the left parietal and anterior left temporal lobes. Background advanced patchy and confluent T2 FLAIR hyperintense signal abnormality within the cerebral white matter, nonspecific but compatible with chronic small vessel ischemic disease. Small chronic infarct again demonstrated within the left basal ganglia. Mild chronic small vessel ischemic changes within the pons. Known small chronic infarcts within the left cerebellar hemisphere. Small chronic infarct within the right cerebellar hemisphere, new from the prior MRI (series 14, image 9). Several small nonspecific chronic microhemorrhages scattered within the supratentorial brain. No evidence of an intracranial mass. No extra-axial fluid collection. No midline shift. Vascular: Maintained flow voids within the proximal large arterial vessels. Skull and upper cervical spine: Abnormal T1 hypointense marrow signal within the calvarium and within the visible portions of the cervical spine. Susceptibility artifact arising from ACDF hardware. Sinuses/Orbits: No mass or acute finding within the imaged orbits. No significant paranasal sinus disease. Other: Trace fluid within right mastoid air cells. Impression #1 will be called to the ordering clinician or representative by the Radiologist Assistant, and communication documented in the PACS or [employer]. IMPRESSION: 1. Small ill-defined foci of diffusion-weighted signal abnormality within the cortical/subcortical right occipital lobe and left occipital lobe white matter, likely reflecting acute/subacute infarcts. 2. Background parenchymal  atrophy, chronic small vessel ischemic disease and chronic infarcts, as described. Electronically Signed   By: Rockey Childs D.O.   On: 01/19/2024 09:39      Medications:    cefTRIAXone  (ROCEPHIN )  IV Stopped (01/20/24 0506)   feeding supplement (NEPRO CARB STEADY) 40 mL/hr at 01/19/24 1930   metronidazole  500 mg (01/20/24 0956)    ascorbic acid   500 mg Per Tube BID   aspirin   81 mg Per Tube Daily   Chlorhexidine  Gluconate Cloth  6 each Topical Daily   cloBAZam   5 mg Per Tube Q12H   collagenase    Topical Daily   feeding supplement (PROSource TF20)  60 mL Per Tube Daily   [START ON 01/22/2024] haloperidol   1 mg Per Tube Once per day on Tuesday Saturday   heparin  injection (subcutaneous)  5,000 Units Subcutaneous Q8H   lacosamide   200 mg Per Tube BID   levETIRAcetam   500 mg Per Tube BID   lidocaine  (PF)  5 mL Subcutaneous Once   liver oil-zinc  oxide   Topical BID   midodrine   10 mg Per Tube TID  WC   multivitamin  1 tablet Per Tube QHS   nutrition supplement (JUVEN)  1 packet Per Tube BID BM   pantoprazole  (PROTONIX ) IV  40 mg Intravenous QHS   saccharomyces boulardii  250 mg Per Tube BID   sertraline   25 mg Per Tube Daily   thiamine   100 mg Per Tube Daily   vancomycin   125 mg Per Tube QID   white petrolatum    Topical BID   zinc  sulfate (50mg  elemental zinc )  220 mg Per Tube Daily   acetaminophen , docusate, hydrALAZINE , HYDROmorphone  (DILAUDID ) injection, HYDROmorphone , hydrOXYzine , LORazepam , ondansetron  (ZOFRAN ) IV, mouth rinse, polyethylene glycol  Assessment/ Plan:  Ms. Sidonia Nutter is a 68 y.o.  female with end stage renal disease on hemodialysis, seizure disorders, congestive heart failure, depression, bipolar disorder, and breast cancer who is admitted to [facility] on 01/15/2024 for Sepsis [facility]) [A41.9]  [facility] RIJ permcath 56kg.   End Stage Renal Disease: no indication for dialysis.   jPermcath removed on 7/8. -Last dialysis session received on 7/5. -  Due to uremic symptoms, BUN 101.  HD temp cath has been requested from vascular surgery. - Likely to be placed late this afternoon.  Patient will receive dialysis treatment in AM.  Hypotension: with sepsis - appreciate ICU input.  - Receiving hydralazine  and midodrine  at this time. - broad spectrum antibiotics, ceftriaxone , metronidazole  and vancomycin .  - Blood pressure stable, 116/62.  Anemia with chronic kidney disease: on Mircera as outpatient.  Hemoglobin improved 8.6.  Will order low-dose ESA with dialysis in a.m.  Secondary Hyperparathyroidism: Sevelamer  held.  Bone mineral is within optimal range.   LOS: 5 Kamariah Fruchter 7/10/20251:20 PM

## 2024-01-20 NOTE — Progress Notes (Signed)
 Palliative Care Progress Note, Assessment & Plan   Patient Name: Sherry [name]       Date: 01/20/2024 DOB: 1955-09-30  Age: 68 y.o. MRN#: [id number] Attending Physician: Von Bellis, MD Primary Care Physician: Caleen Dirks, MD Admit Date: 01/15/2024  Subjective: Patient is lying in bed in no apparent distress.  She does not awaken to my presence.  She does not engage in any conversations or make any vocalizations during my visit.  Her sister/HCPOA Sherry [name], Sherry [name]'s 2 sons, and Sherry [name]'s daughter are present at bedside during my visit.  HPI: 68 y.o. female  with past medical history significant for ESRD on HD, seizure disorder, chroninc hepatitis C, opioid use disorder, CVA, s/p gastrostomy tube, HTN, dCHF, depression with anxiety, bipolar, breast cancer s/p R mastectomy. She presented to [facility] ED from [facility] 01/15/2024 with c/o AMS and hypotension. It was reported that patient had received dialysis PTA and was somnolent throughout dialysis session.    ED workup significant for lactic 6.1, WBC 21.6, Hgb 9.1, Hct 29.7, plts 434, creatinine 3.1, BUN 33 and K+ 3.7. CXR negative.  BP 81/71, HR 100, RR 16, SpO2 100% RA, 98.6.    Of note, patient was previously admitted to [facility] 6/4-6/13/25 with E.coli bacteremia and signs of cholangitis. She underwent ERCP 6/6 that found choledocholithiasis. Sphincterotomy was performed and plastic stent placed.    She was admitted for management of sepsis, sacral wound with signs of sacral osteomyelitis.    Palliative team consulted for assistance with goals of care conversations.  Summary of counseling/coordination of care: Extensive chart review completed prior to meeting patient including labs, vital signs, imaging, progress notes, orders, and available advanced  directive documents from current and previous encounters.   After reviewing the patient's chart and assessing the patient at bedside, I spoke with patient's sister this morning and then returns bedside again this afternoon when more family was present.   During our visit, Dr. Voncile (Neurology) joined our discussion.  Extensive discussion of patient's neurological, functional, and nutritional status reviewed.  Space and opportunity provided for patient's family to ask questions and voiced their concerns.  Discussed multifactorial contributing factors to patient's overall poor prognosis.  Discussed liver abscess, ESRD, osteomyelitis of wound, history of seizures and stroke, acute and subacute infarcts found on MRI, and use of artificial tube feeds with poor functional status. Extensive discussion of results of MRI and very minuscule chance of patient having meaningful neurological recovery.   Therapeutic silence and active listening provided for family to share their thoughts and emotions regarding current medical situation.  Emotional support provided.  Given little to no chance for meaningful recovery, patient's family in agreement to shift to full comfort measures.  Hospice philosophy and hospice inpatient unit discussed.  Family is requesting the patient be transferred to a hospice inpatient facility.  Discussed that TOC will offer choice of hospice agencies and that patient will be evaluated by hospice physicians for admission to [facility].  We discussed comfort measures at great length. Enisa would no longer receive aggressive medical interventions such as continuous vital signs, lab work, radiology testing, or medications not focused on comfort. All care would focus on how she is looking and feeling. This  would include management of any symptoms that may cause discomfort, pain, shortness of breath, cough, nausea, agitation, anxiety, and/or secretions etc. Symptoms would be managed with medications and  other non-pharmacological interventions such as spiritual support if requested, repositioning, music therapy, or therapeutic listening.  Nonverbal signs of pain or discomfort reviewed given patient will likely not make vocalizations when experiencing pain or discomfort.  Family verbalized understanding and appreciation.   After visiting with family, communicated via secure chat with ID, vascular surgery, RN, and TOC in regards to shifting to full comfort measures.  Adjustments made to [facility].  Advised RN that there is a password: Sorry and that specific visitors have been listed below was allowed to be in patient's room.  He has a list and plans to transcribe it into chart for front desk staff to see.  PMT will continue to follow and support.  Physical Exam Vitals reviewed.  Constitutional:      General: She is not in acute distress.    Appearance: She is ill-appearing.  HENT:     Mouth/Throat:     Mouth: Mucous membranes are moist.     Comments: drooling Pulmonary:     Breath sounds: Rhonchi present.  Skin:    General: Skin is warm and dry.  Neurological:     Comments: nonverbal             Total Time 180 minutes   Time spent includes: Detailed review of medical records (labs, imaging, vital signs), medically appropriate exam (mental status, respiratory, cardiac, skin), discussed with treatment team, counseling and educating patient, family and staff, documenting clinical information, medication management and coordination of care.  Lamarr L. Arvid, DNP, FNP-BC Palliative Medicine Team

## 2024-01-20 NOTE — Progress Notes (Signed)
 NEUROLOGY CONSULT FOLLOW UP NOTE   Date of service: January 20, 2024 Patient Name: Sherry [name] MRN:  [id number] DOB:  1956/03/26  Interval Hx/subjective  Seen and examined. Imaging and EEG completed Vitals   Vitals:   01/20/24 0015 01/20/24 0423 01/20/24 0500 01/20/24 0732  BP: 119/75 (!) 141/65  116/62  Pulse: 80 78  77  Resp: 20 16    Temp: 98.5 F (36.9 C) 98.6 F (37 C)  99.3 F (37.4 C)  TempSrc:    Oral  SpO2: 95% 100%  97%  Weight:   60.2 kg   Height:         Body mass index is 24.27 kg/m.  Physical Exam   General: Lethargic, to noxious stimulation only opens eyes.  Does not track. HEENT: Normocephalic atraumatic Lungs: Clear Neurological exam Lethargic, to noxious stimulation opens eyes.  Does not follow commands. Appears to have a right lower facial droop.  Also has neck turned towards the right.  Has mild rightward gaze preference.  Does not blink to threat from either side. No withdrawal to noxious stimulation in any of the 4 extremities. The only change in examination today-some spontaneous grimace sounds noted today.  Medications  Current Facility-Administered Medications:    acetaminophen  (TYLENOL ) tablet 650 mg, 650 mg, Per Tube, Q6H PRN, Marea, Selinda RAMAN, MD   ascorbic acid  (VITAMIN C ) tablet 500 mg, 500 mg, Per Tube, BID, Dew, Jason S, MD, 500 mg at 01/20/24 0801   aspirin  chewable tablet 81 mg, 81 mg, Per Tube, Daily, Von Bellis, MD, 81 mg at 01/20/24 0802   cefTRIAXone  (ROCEPHIN ) 2 g in sodium chloride  0.9 % 100 mL IVPB, 2 g, Intravenous, Q24H, Dew, Selinda RAMAN, MD, Last Rate: 200 mL/hr at 01/20/24 0406, 2 g at 01/20/24 0406   Chlorhexidine  Gluconate Cloth 2 % PADS 6 each, 6 each, Topical, Daily, Dew, Jason S, MD, 6 each at 01/19/24 1030   cloBAZam  (ONFI ) 2.5 MG/ML oral suspension 5 mg, 5 mg, Per Tube, Q12H, Marea Selinda RAMAN, MD, 5 mg at 01/20/24 0801   collagenase  (SANTYL ) ointment, , Topical, Daily, Von Bellis, MD   docusate (COLACE) 50 MG/5ML liquid 100  mg, 100 mg, Per Tube, BID PRN, Marea, Selinda RAMAN, MD   feeding supplement (NEPRO CARB STEADY) liquid 237 mL, 237 mL, Per Tube, Continuous, Dew, Selinda RAMAN, MD, Last Rate: 40 mL/hr at 01/19/24 1930, Infusion Verify at 01/19/24 1930   feeding supplement (PROSource TF20) liquid 60 mL, 60 mL, Per Tube, Daily, Dew, Selinda RAMAN, MD, 60 mL at 01/20/24 0800   [START ON 01/22/2024] haloperidol  (HALDOL ) tablet 1 mg, 1 mg, Per Tube, Once per day on Tuesday Saturday, Dew, Selinda RAMAN, MD   heparin  injection 5,000 Units, 5,000 Units, Subcutaneous, Q8H, Patel, Kishan S, RPH, 5,000 Units at 01/20/24 9451   hydrALAZINE  (APRESOLINE ) tablet 50 mg, 50 mg, Per Tube, Q6H PRN, Marea Selinda RAMAN, MD   HYDROmorphone  (DILAUDID ) injection 1 mg, 1 mg, Intravenous, Q4H PRN, Marea Selinda RAMAN, MD, 1 mg at 01/16/24 0931   HYDROmorphone  (DILAUDID ) tablet 2 mg, 2 mg, Per Tube, Q6H PRN, Marea Selinda RAMAN, MD   hydrOXYzine  (ATARAX ) tablet 25 mg, 25 mg, Per Tube, TID PRN, Marea Selinda RAMAN, MD   lacosamide  (VIMPAT ) oral solution 200 mg, 200 mg, Per Tube, BID, Niels Kayla FALCON, RPH, 200 mg at 01/20/24 0800   levETIRAcetam  (KEPPRA ) 100 MG/ML solution 500 mg, 500 mg, Per Tube, BID, Dew, Jason S, MD, 500 mg at 01/20/24 [phone]  lidocaine  (PF) (XYLOCAINE ) 1 % injection 5 mL, 5 mL, Subcutaneous, Once, Jenna Cordella LABOR, MD   liver oil-zinc  oxide (DESITIN) 40 % ointment, , Topical, BID, Marea Selinda RAMAN, MD, Given at 01/20/24 0803   LORazepam  (ATIVAN ) injection 2 mg, 2 mg, Intravenous, Q5 Min x 3 PRN, Marea, Selinda RAMAN, MD   metroNIDAZOLE  (FLAGYL ) IVPB 500 mg, 500 mg, Intravenous, Q12H, Fayette Bodily, MD, Stopped at 01/19/24 2258   midodrine  (PROAMATINE ) tablet 10 mg, 10 mg, Per Tube, TID WC, Von Bellis, MD, 10 mg at 01/20/24 0754   multivitamin (RENA-VIT) tablet 1 tablet, 1 tablet, Per Tube, QHS, Marea Selinda RAMAN, MD, 1 tablet at 01/19/24 2157   nutrition supplement (JUVEN) (JUVEN) powder packet 1 packet, 1 packet, Per Tube, BID BM, Marea Selinda RAMAN, MD, 1 packet at 01/20/24  0800   ondansetron  (ZOFRAN ) injection 4 mg, 4 mg, Intravenous, Q6H PRN, Marea Selinda RAMAN, MD   Oral care mouth rinse, 15 mL, Mouth Rinse, PRN, Marea, Selinda RAMAN, MD   pantoprazole  (PROTONIX ) injection 40 mg, 40 mg, Intravenous, QHS, Von Bellis, MD, 40 mg at 01/19/24 2156   polyethylene glycol (MIRALAX  / GLYCOLAX ) packet 17 g, 17 g, Per Tube, Daily PRN, Marea Selinda RAMAN, MD   saccharomyces boulardii (FLORASTOR) capsule 250 mg, 250 mg, Per Tube, BID, Marea Selinda RAMAN, MD, 250 mg at 01/20/24 0801   sertraline  (ZOLOFT ) tablet 25 mg, 25 mg, Per Tube, Daily, Marea Selinda RAMAN, MD, 25 mg at 01/20/24 0801   thiamine  (VITAMIN B1) tablet 100 mg, 100 mg, Per Tube, Daily, Marea Selinda RAMAN, MD, 100 mg at 01/20/24 0801   vancomycin  (VANCOCIN ) 50 mg/mL oral solution SOLN 125 mg, 125 mg, Per Tube, QID, Marea Selinda RAMAN, MD, 125 mg at 01/20/24 0801   white petrolatum  (VASELINE) gel, , Topical, BID, Marea Selinda RAMAN, MD, 1 Application at 01/20/24 0802   zinc  sulfate (50mg  elemental zinc ) capsule 220 mg, 220 mg, Per Tube, Daily, Marea Selinda RAMAN, MD, 220 mg at 01/20/24 0801  Labs and Diagnostic Imaging   CBC:  Recent Labs  Lab 01/15/24 1300 01/16/24 0419 01/19/24 0333 01/20/24 0321  WBC 21.6*   < > 15.1* 9.5  NEUTROABS 20.3*  --   --   --   HGB 9.1*   < > 7.0* 8.6*  HCT 29.7*   < > 23.3* 26.7*  MCV 93.4   < > 93.6 89.6  PLT 434*   < > 412* 350   < > = values in this interval not displayed.    Basic Metabolic Panel:  Lab Results  Component Value Date   NA 140 01/20/2024   K 3.0 (L) 01/20/2024   CO2 21 (L) 01/20/2024   GLUCOSE 119 (H) 01/20/2024   BUN 101 (H) 01/20/2024   CREATININE 4.58 (H) 01/20/2024   CALCIUM  8.5 (L) 01/20/2024   GFRNONAA 10 (L) 01/20/2024   GFRAA 18 (L) 07/25/2020   Lipid Panel:  Lab Results  Component Value Date   LDLCALC 123 (H) 04/04/2020   INR  Lab Results  Component Value Date   INR 1.1 01/15/2024   APTT  Lab Results  Component Value Date   APTT 40 (H) 12/15/2023    MRI brain personally  reviewed-compared with March 2025-small ill-defined foci of DWI abnormality in the cortical subcortical right occipital lobe and left occipital lobe white matter likely acute to subacute strokes.  Given prior history, could be compatible with sequela of PR ES although radiology did not have access to the  old scans from the outside facility. Routine EEG: Generalized periodic discharges with triphasic/EEG worse than prior day due to longer duration of EEG attenuations.  Likely secondary to toxic metabolic causes  Assessment   Sherry [name] is a 68 y.o. female with an extensive and complicated past medical history and a complicated course with sepsis, nonconvulsive status epilepticus and severe encephalopathy over the past few months-treated for meningitis versus PR ES versus autoimmune encephalitis at an outside institution who continues to be in an extremely poor state of health as well as appears to have minimal level of arousal.  She is at this point struggling with multiorgan failure, sacral decub's ulcer, C. difficile colitis and E. coli bacteremia on top of malnutrition and poor brain reserve. Given that over more than past 6 months, she has not been in regular state of health and in the past few months barely been awake and arousable, it is unlikely that she will have any significant neurologically meaningful recovery given her overall medical picture with the above side issues and multiorgan failure. Her brain MRI shows extensive chronic damage and subacute strokes versus sequela of PR ES.  That in itself is evidence of poor brain reserve. Severe sepsis, malnutrition, metabolic derangements predominantly renal dysfunction with difficulty to even dialyze in the setting of poor brain reserve portend her extremely poor chances of any meaningful recovery. Palliative care was in the room during my discussions today and family is leaning towards comfort measures.  Recommendations  At this point, I do not  have any significant input in terms of any more neurological workup to be done. I had a detailed conversation with the family and answered all the questions to the best of my ability. I think comfort measures is the appropriate course  Plan relayed to Dr. Von and Dr. Lanelle ______________________________________________________________________   Signed, Eligio Lav, MD [facility]

## 2024-01-20 NOTE — TOC Progression Note (Signed)
 Transition of Care [facility]) - Progression Note    Patient Details  Name: Shannin Naab MRN: [id number] Date of Birth: [birth year]  Transition of [facility]) CM/SW Contact  Lauraine JAYSON Carpen, LCSW Phone Number: 01/20/2024, 4:11 PM  Clinical Narrative:  Met with sister. Had niece on speaker phone. Top preference hospice facility is [facility], second is [facility]. Referral made to [facility] with [facility].  Expected Discharge Plan and Services                                               Social Determinants of Health (SDOH) Interventions SDOH Screenings   Food Insecurity: Patient Unable To Answer (01/15/2024)  Housing: Patient Unable To Answer (01/15/2024)  Transportation Needs: Patient Unable To Answer (01/15/2024)  Utilities: Patient Unable To Answer (01/15/2024)  Alcohol  Screen: Medium Risk (08/27/2017)  Depression (PHQ2-9): Medium Risk (12/20/2020)  Social Connections: Patient Unable To Answer (01/15/2024)  Tobacco Use: High Risk (01/15/2024)    Readmission Risk Interventions    01/16/2024    4:23 PM 01/16/2024    9:55 AM  Readmission Risk Prevention Plan  Transportation Screening Complete   Medication Review (RN Care Manager) Complete Complete  PCP or Specialist appointment within 3-5 days of discharge Complete Complete  HRI or Home Care Consult Not Complete   SW Recovery Care/Counseling Consult Complete   Palliative Care Screening Not Applicable   Skilled Nursing Facility Complete

## 2024-01-21 DIAGNOSIS — R6520 Severe sepsis without septic shock: Secondary | ICD-10-CM | POA: Diagnosis not present

## 2024-01-21 DIAGNOSIS — M869 Osteomyelitis, unspecified: Secondary | ICD-10-CM | POA: Diagnosis not present

## 2024-01-21 DIAGNOSIS — K750 Abscess of liver: Secondary | ICD-10-CM | POA: Diagnosis not present

## 2024-01-21 DIAGNOSIS — N179 Acute kidney failure, unspecified: Secondary | ICD-10-CM | POA: Diagnosis not present

## 2024-01-21 DIAGNOSIS — B952 Enterococcus as the cause of diseases classified elsewhere: Secondary | ICD-10-CM | POA: Insufficient documentation

## 2024-01-21 DIAGNOSIS — R4182 Altered mental status, unspecified: Secondary | ICD-10-CM | POA: Diagnosis not present

## 2024-01-21 DIAGNOSIS — E43 Unspecified severe protein-calorie malnutrition: Secondary | ICD-10-CM | POA: Diagnosis not present

## 2024-01-21 DIAGNOSIS — A419 Sepsis, unspecified organism: Secondary | ICD-10-CM | POA: Diagnosis not present

## 2024-01-21 LAB — VITAMIN A: Vitamin A (Retinoic Acid): 19.3 ug/dL — ABNORMAL LOW (ref 22.0–69.5)

## 2024-01-21 NOTE — Progress Notes (Signed)
 Nutrition Brief Note  Chart reviewed. Pt now transitioning to comfort care.  No further nutrition interventions planned at this time.  Please re-consult as needed.   Levada Schilling, RD, LDN, CDCES Registered Dietitian III Certified Diabetes Care and Education Specialist If unable to reach this RD, please use "RD Inpatient" group chat on secure chat between hours of 8am-4 pm daily

## 2024-01-21 NOTE — Discharge Summary (Signed)
 Physician Discharge Summary   Patient: Sherry [name] MRN: [id number] DOB: July 29, 1955  Admit date:     01/15/2024  Discharge date: 01/21/24  Discharge Physician: Reilley Valentine   PCP: Amin, Saad, MD   Recommendations at discharge:   Patient is being discharged on comfort measures  Discharge Diagnoses: Principal Problem:   Sepsis (HCC) Active Problems:   Encounter for fit/adjst of extracorporeal dialysis catheter (HCC)   Bacteremia due to Escherichia coli   Osteomyelitis (HCC)   Pressure injury of skin   Protein-calorie malnutrition, severe   Altered mental status   Liver abscess  Resolved Problems:   * No resolved hospital problems. [facility] Course: Sherry [name] is a 68 year old female patient with a past medical history of end-stage renal disease on HD MWF via right tunneled cath, seizure disorder, chronic hepatitis C, opioid use disorder, stroke, status post gastrostomy tube, hypertension, diastolic congestive heart failure, depression with anxiety, bipolar, breast cancer status post right mastectomy presenting to [facility] on 07/05 for confusion and altered mental status and hypotension.   She received dialysis earlier today and was reportedly described as being somnolent throughout the dialysis session.  She went back to her rehab and was found to be confused and unresponsive by the team and therefore EMS were called.  On arrival EMS she was found to have systolics in the 80s and therefore she was brought in to [facility] ED.   Of note, she was recently admitted to [facility] on 06/04 to 06/13 with E. coli bacteremia and signs of cholangitis.  She underwent ERCP on 06/06 and was noted to have choledocholithiasis, sphincterotomy was done and a plastic stent was placed.   In the ED, she was found to be hypotensive with MAP low 60s, she received 1 L normal saline bolus with good response to fluids.  She was started on broad-spectrum antibiotics with vancomycin  and aztreonam  and Flagyl .    Labs: Lactate 6.1 WBC 21.6 with 94% neutrophils, hemoglobin 9.1 and hematocrit 29.7, platelets 434. Electrolyte with CO2 21, BUN 33 and creatinine 3.1.  Potassium 3.7.   Chest x-ray without any acute process.  Assessment and Plan:   Comfort measures only started on 7/10 Due to poor prognosis palliative care was involved and extensive discussion was had with patient's family, primary hospitalist and neurologist.  Finally family agreed for comfort measures. Patient is being discharged to inpatient rehab in [place]   During hospital stay patient was managed as below. _____________________________________________     # Metabolic encephalopathy due to sepsis Continue supportive care, fall precautions, aspiration precaution Continue to treat as below     # Sepsis, multifactorial could be due to C. difficile and E. coli bacteremia, Enterococcus faecium bacteremia, decub osteomyelitis and liver abscess H/o recent instrumentation with ERCP sphincterotomy and stent placement (12/17/2023)  Seen by GI recommended no need of repeat ERCP, may repeat MRI to look at the liver lesion. S/p vancomycin  and cefepime  Blood culture growing E. coli, patient was on ceftriaxone  Stool positive for C. Difficile and patient was started on vancomycin  enteric via G-tube    # C. difficile gastroenteritis 7/6 started  vancomycin  125 mg p.o. Q6 hourly for 10 days Started probiotics     # Liver abscess GI consulted, recommended MRI versus CT scan with contrast IR consulted, s/p Two aspirations of two separte right lobe liver fluid collections.  Samples obtained and sent to Pathology. Done on 7/8, follow fluid culture report Follow ID consult     # Decub osteomyelitis General  Surgery consulted, recommended no intervention, less chances of healing Turn patient every 2 hours      # ESRD on hemodialysis, MWF schedule Right tunneled catheter need to be removed due to E. coli bacteremia Nephrology  consulted vascular surgery to remove removed tunneled HD catheter, which was removed on 7/8      # Seizure disorder Patient was on Keppra  500 twice daily, clobazam  5 mg twice daily and Vimpat  200 mg twice daily EEG: generalized periodic discharges with triphasic morphology which can be on the ictal-interictal continuum. could also be secondary to toxic-metabolic causes. Additionally there is severe diffuse encephalopathy. No definite seizures were noted.  7/8 d/w neurology, recommended continue current treatment, will repeat EEG and brain imaging tomorrow a.m. Overall poor prognosis, recommended palliative care. 7/9 MRI brain possible acute subacute stroke, and repeat EEG shows worsening of triphasic morphology consistent with ictal interictal continuum. Discussed with neurology, recommended comfort care due to poor prognosis. Patient will be discharged to an inpatient hospice   # h/o CVA,    # Depression and bipolar disorder    # HTN, diastolic CHF      # History of chronic hep C  # History of breast cancer,  no active issues   # Iron deficiency, Tsat 4%    # Severe protein calorie malnutrition   Overall prognosis is poor and patient will be discharged to an inpatient hospice        Consultants: Infectious disease, vascular surgery, critical care, gastroenterology Procedures performed: Dialysis Disposition: Inpatient hospice Diet recommendation:  NPO   DISCHARGE MEDICATION: Allergies as of 01/21/2024       Reactions   Abilify [aripiprazole] Other (See Comments)   Tardive dyskinesia Oral   Remeron  [mirtazapine ] Other (See Comments)   Wgt stimulation /gain, Dizziness, Patient says can tolerate   Trazodone  And Nefazodone Other (See Comments)   Nightmares/sleep diturbance   Augmentin [amoxicillin -pot Clavulanate]    Flexeril [cyclobenzaprine] Other (See Comments)   Pt states Flexeril makes her feel depressed    Amoxicillin  Diarrhea        Medication List      STOP taking these medications    acetaminophen  325 MG tablet Commonly known as: TYLENOL    ascorbic acid  500 MG tablet Commonly known as: VITAMIN C    aspirin  81 MG chewable tablet   carvedilol  12.5 MG tablet Commonly known as: COREG    cefTAZidime in dextrose  5 % 50 mL   cetirizine HCl 1 MG/ML solution Commonly known as: ZYRTEC   cloBAZam  2.5 MG/ML solution Commonly known as: ONFI    diphenhydrAMINE  25 mg capsule Commonly known as: BENADRYL    feeding supplement (NEPRO CARB STEADY) Liqd   ferrous sulfate  325 (65 FE) MG tablet   gabapentin  100 MG capsule Commonly known as: NEURONTIN    haloperidol  1 MG tablet Commonly known as: HALDOL    hydrALAZINE  50 MG tablet Commonly known as: APRESOLINE    hydrocortisone  cream 1 %   hydrOXYzine  25 MG tablet Commonly known as: ATARAX    lacosamide  200 MG Tabs tablet Commonly known as: VIMPAT    levETIRAcetam  100 MG/ML solution Commonly known as: KEPPRA    multivitamin Tabs tablet   mupirocin  ointment 2 % Commonly known as: BACTROBAN    omeprazole  20 MG tablet Commonly known as: PRILOSEC OTC   ondansetron  4 MG/5ML solution Commonly known as: ZOFRAN    oxyCODONE  5 MG immediate release tablet Commonly known as: Oxy IR/ROXICODONE    pantoprazole  40 MG tablet Commonly known as: Protonix    polyethylene glycol 17 g packet Commonly known  as: MIRALAX  / GLYCOLAX    senna-docusate 8.6-50 MG tablet Commonly known as: Senokot-S   sertraline  25 MG tablet Commonly known as: ZOLOFT    sevelamer  carbonate 800 MG tablet Commonly known as: RENVELA    Ventolin  HFA 108 (90 Base) MCG/ACT inhaler Generic drug: albuterol    zinc  sulfate (50mg  elemental zinc ) 220 (50 Zn) MG capsule               Discharge Care Instructions  (From admission, onward)           Start     Ordered   01/21/24 0000  Discharge wound care:       Comments: Cleanse sacral wound with Vashe wound cleanser Soila [phone]) do not rinse and allow to  air dry. Using a Q tip applicator apply Dakin's moistened gauze to wound bed daily x 3 days making sure to cover any areas of depth and undermining  Cover with dry gauze and secure with ABD pad and tape or silicone foam whichever is preferred. After 3 days Dakins will begin Santyl  for continued debridement.   01/21/24 1123            Discharge Exam: Filed Weights   01/18/24 0500 01/19/24 0347 01/20/24 0500  Weight: 55.3 kg 55.2 kg 60.2 kg   General: NAD, lying comfortably, unresponsive Eyes: Closed  ENT: Oral Mucosa dry   Neck: no JVD,  Cardiovascular: S1 and S2 Present, no Murmur,  Respiratory: Equal air entry bilaterally, conducting sounds due to oral secretions, no wheezes Abdomen: Bowel Sound present, Soft and no tenderness,  Extremities: no Pedal edema Neurologic: Remains unresponsive, unable to assess    Condition at discharge: poor  The results of significant diagnostics from this hospitalization (including imaging, microbiology, ancillary and laboratory) are listed below for reference.   Imaging Studies: EEG adult Result Date: 01/19/2024 Shelton Arlin KIDD, MD     01/19/2024  1:42 PM Patient Name: Vandana Haman MRN: [id number] Epilepsy Attending: Arlin KIDD Shelton Referring Physician/Provider: Voncile Isles, MD Date: 01/19/2024 Duration: 32.28 mins  Patient history: 69yo F with ams. EEG to evaluate for seizure  Level of alertness: comatose  AEDs during EEG study: LEV, LCM, Onfi   Technical aspects: This EEG study was done with scalp electrodes positioned according to the 10-20 International system of electrode placement. Electrical activity was reviewed with band pass filter of 1-70Hz , sensitivity of 7 uV/mm, display speed of 48mm/sec with a 60Hz  notched filter applied as appropriate. EEG data were recorded continuously and digitally stored.  Video monitoring was available and reviewed as appropriate.  Description: EEG showed near continuous generalized 2 to 3 Hz delta slowing admixed  with 1-3 seconds of generalized EEG attenuation.  Generalized periodic discharges with triphasic morphology were also noted at 0.5 to 1.5 Hz. Hyperventilation and photic stimulation were not performed.    ABNORMALITY - Periodic discharges with triphasic morphology, generalized -Continuous slow, generalized -Background condition, generalized   IMPRESSION: This study showed generalized periodic discharges with triphasic morphology which can be on the ictal-interictal continuum.  However given the morphology and frequency, could also be secondary to toxic-metabolic causes.  Additionally there is severe diffuse encephalopathy.  No definite seizures were noted.  EEG appears slightly worse compared to previous study due to longer periods of eeg attenuations.   Priyanka O Yadav    US  FINE NEEDLE ASP 1ST LESION Result Date: 01/19/2024 INDICATION: Multiple hepatic abscesses EXAM: Ultrasound-guided hepatic fine-needle aspiration MEDICATIONS: None. ANESTHESIA/SEDATION: Moderate (conscious) sedation was employed during this procedure. A total of Versed   0 mg and Fentanyl  0 mcg was administered intravenously by the radiology nurse. Total intra-service moderate Sedation Time: 0 minutes. The patient's level of consciousness and vital signs were monitored continuously by radiology nursing throughout the procedure under my direct supervision. COMPLICATIONS: None immediate. PROCEDURE: Informed written consent was obtained from the patient's sister after a thorough discussion of the procedural risks, benefits and alternatives. All questions were addressed. Maximal Sterile Barrier Technique was utilized including caps, mask, sterile gowns, sterile gloves, sterile drape, hand hygiene and skin antiseptic. A timeout was performed prior to the initiation of the procedure. With the patient in a supine position the right upper quadrant was evaluated with ultrasound and numerous lesions are again identified in similar location as seen on  previous CT of the abdomen and pelvis. The skin was prepped and draped in usual sterile fashion. Local anesthesia was achieved with 1% lidocaine . The Yueh needle was then advanced under ultrasound guidance into the 2 largest collections. The first needle was advanced from the skin to the midpoint of the lesion and aspiration was performed yielding have predominantly blood products. The needle was discarded and a larger region was targeted with the Yueh needle and pus was obtained after advancing the needle from the skin to the midpoint of the lesion and aspirating. Post aspiration images demonstrate no acute hemorrhage. IMPRESSION: Fine-needle aspiration of 2 fluid collections in the liver with ultrasound-guided Electronically Signed   By: Cordella Banner   On: 01/19/2024 13:15   MR BRAIN WO CONTRAST Result Date: 01/19/2024 CLINICAL DATA:  Provided history: Seizure disorder, clinical change. EXAM: MRI HEAD WITHOUT CONTRAST TECHNIQUE: Multiplanar, multiecho pulse sequences of the brain and surrounding structures were obtained without intravenous contrast. COMPARISON:  Head CT 01/15/2024.  Brain MRI 09/20/2023. FINDINGS: A routine protocol noncontrast brain MRI was performed. Brain: Stable cerebral atrophy. Small ill-defined foci of diffusion-weighted signal abnormality within the cortical/subcortical right occipital lobe and left occipital lobe white matter, likely reflecting acute/subacute infarcts. Small chronic cortically-based infarcts again demonstrated within the left parietal and anterior left temporal lobes. Background advanced patchy and confluent T2 FLAIR hyperintense signal abnormality within the cerebral white matter, nonspecific but compatible with chronic small vessel ischemic disease. Small chronic infarct again demonstrated within the left basal ganglia. Mild chronic small vessel ischemic changes within the pons. Known small chronic infarcts within the left cerebellar hemisphere. Small chronic  infarct within the right cerebellar hemisphere, new from the prior MRI (series 14, image 9). Several small nonspecific chronic microhemorrhages scattered within the supratentorial brain. No evidence of an intracranial mass. No extra-axial fluid collection. No midline shift. Vascular: Maintained flow voids within the proximal large arterial vessels. Skull and upper cervical spine: Abnormal T1 hypointense marrow signal within the calvarium and within the visible portions of the cervical spine. Susceptibility artifact arising from ACDF hardware. Sinuses/Orbits: No mass or acute finding within the imaged orbits. No significant paranasal sinus disease. Other: Trace fluid within right mastoid air cells. Impression #1 will be called to the ordering clinician or representative by the Radiologist Assistant, and communication documented in the PACS or [employer]. IMPRESSION: 1. Small ill-defined foci of diffusion-weighted signal abnormality within the cortical/subcortical right occipital lobe and left occipital lobe white matter, likely reflecting acute/subacute infarcts. 2. Background parenchymal atrophy, chronic small vessel ischemic disease and chronic infarcts, as described. Electronically Signed   By: Rockey Childs D.O.   On: 01/19/2024 09:39   PERIPHERAL VASCULAR CATHETERIZATION Result Date: 01/18/2024 See surgical note for result.  EEG adult  Result Date: 01/18/2024 Shelton Arlin KIDD, MD     01/18/2024  8:39 AM Patient Name: Raneisha Bress MRN: [id number] Epilepsy Attending: Arlin KIDD Shelton Referring Physician/Provider: Von Bellis, MD Date: 01/17/2024 Duration: 26.40 mins Patient history: 67yo F with ams. EEG to evaluate for seizure Level of alertness: comatose AEDs during EEG study: LEV, LCM, Onfi  Technical aspects: This EEG study was done with scalp electrodes positioned according to the 10-20 International system of electrode placement. Electrical activity was reviewed with band pass filter of 1-70Hz ,  sensitivity of 7 uV/mm, display speed of 75mm/sec with a 60Hz  notched filter applied as appropriate. EEG data were recorded continuously and digitally stored.  Video monitoring was available and reviewed as appropriate. Description: EEG showed near continuous generalized 2 to 3 Hz delta slowing admixed with 0.5 to 1 seconds of generalized EEG attenuation.  Generalized periodic discharges with triphasic morphology were also noted at 0.5 to 1.5 Hz. Hyperventilation and photic stimulation were not performed.   ABNORMALITY - Periodic discharges with triphasic morphology, generalized -Continuous slow, generalized -Background condition, generalized IMPRESSION: This study showed generalized periodic discharges with triphasic morphology which can be on the ictal-interictal continuum.  However given the morphology and frequency, could also be secondary to toxic-metabolic causes.  Additionally there is severe diffuse encephalopathy.  No definite seizures were noted. If concern for ictal-interictal activity persist, please consider long-term EEG. Priyanka KIDD Shelton   CT ABDOMEN PELVIS WO CONTRAST Result Date: 01/15/2024 CLINICAL DATA:  Abdomen pain unresponsive EXAM: CT ABDOMEN AND PELVIS WITHOUT CONTRAST TECHNIQUE: Multidetector CT imaging of the abdomen and pelvis was performed following the standard protocol without IV contrast. RADIATION DOSE REDUCTION: This exam was performed according to the departmental dose-optimization program which includes automated exposure control, adjustment of the mA and/or kV according to patient size and/or use of iterative reconstruction technique. COMPARISON:  MRI 12/16/2023, CT 12/15/2023, 04/06/2023 FINDINGS: Lower chest: Lung bases demonstrate scarring or atelectasis at the lung bases. Partially visualized catheter in the right atrium. Trace pericardial effusion. Mitral calcification. Hepatobiliary: Interim placement of biliary stent, proximal part of the stent is in the common bile duct,  distal portion of the stent is in the third portion of duodenum. There is residual intra hepatic biliary dilatation. Focal ill-defined hypodense areas within the periphery of the liver, most evident in the right hepatic lobe, measuring up to 2.3 cm by 4.9 cm inferiorly on the right, series 2, image 33. Common bile duct diameter 2.2 cm, air in the common bile duct. Pancreas: Unremarkable. No pancreatic ductal dilatation or surrounding inflammatory changes. Spleen: Normal in size without focal abnormality. Adrenals/Urinary Tract: Right adrenal gland is normal. 2 cm left adrenal nodule, stable and presumably representing adenoma. Atrophic kidneys without hydronephrosis. Renal cysts for which no imaging follow-up is recommended. Small hyperdense subcentimeter lesions at the mid left kidney too small to further characterize, no specific imaging follow-up is recommended. Thick walled appearance of the urinary bladder Stomach/Bowel: Gastrostomy tube with tip in the body of the stomach. Intra-abdominal portion of tubing appears to course along the anterior wall of the splenic flexure with some haziness around tubing but no frank perforation of the colon. There is no dilated small bowel. Colon is dilated and contains both fluid and large stool mixed with high density material. There may be some colon wall thickening at the rectosigmoid colon. Vascular/Lymphatic: Aortic atherosclerosis. No enlarged abdominal or pelvic lymph nodes. Reproductive: Hysterectomy.  No adnexal mass Other: No free air. No significant ascites. Presacral edema, small volume fluid,  and stranding. Musculoskeletal: Posterior spinal fusion hardware with interbody device at L4-L5. Chronic irregular degenerative change at L5-S1. Sacral coccygeal decubitus ulcer extends down to the bone. Mild erosive change at the sacrococcygeal region concerning for osteomyelitis. Coccygeal segments are displaced anteriorly into the pelvis, posterior to the rectum with  surrounding fluid, sagittal series 6, image 65. Generalized subcutaneous edema. Evidence of prior ventral hernia repair. Chronic advanced degenerative change of the right hip with suspected AVN and mild fragmentation of femoral head. IMPRESSION: 1. Interim placement of biliary stent with residual intra and extrahepatic biliary dilatation. Ill-defined hypodense areas within the periphery of the liver, most evident in the right hepatic lobe, measuring up to 4.9 cm inferiorly on the right. Findings appear progressive compared with the CT from 12/15/2023, and as suggested on the interval MRI, could represent progressively dilated biliary radicles versus intra hepatic abscesses, underlying neoplastic process not excluded. 2. Sacral coccygeal decubitus ulcer extends down to the bone. Mild erosive change at the sacrococcygeal region concerning for osteomyelitis. Coccygeal bone segments/fragments are displaced anteriorly into the pelvis, posterior to the rectum with surrounding fluid. 3. Gastrostomy tube with tip in the body of the stomach. Intra-abdominal portion of tubing appears to course along the anterior wall of the splenic flexure with some haziness around tubing but no frank perforation of the colon. 4. Dilated colon containing both fluid and large stool mixed with high density material. Large fecal impaction at the rectum. There may be some colon wall thickening at the rectosigmoid colon, question colitis. 5. Decompressed thick walled appearance of the urinary bladder, question cystitis. 6. Atrophic kidneys. 7. 2 cm left adrenal nodule, stable and presumably representing adenoma. 8. Aortic atherosclerosis. Aortic Atherosclerosis (ICD10-I70.0). Electronically Signed   By: Luke Bun M.D.   On: 01/15/2024 15:30   CT Head Wo Contrast Result Date: 01/15/2024 CLINICAL DATA:  Delirium hypotensive unresponsive EXAM: CT HEAD WITHOUT CONTRAST TECHNIQUE: Contiguous axial images were obtained from the base of the skull  through the vertex without intravenous contrast. RADIATION DOSE REDUCTION: This exam was performed according to the departmental dose-optimization program which includes automated exposure control, adjustment of the mA and/or kV according to patient size and/or use of iterative reconstruction technique. COMPARISON:  CT brain 12/15/2023, MRI 09/20/2023 FINDINGS: Brain: No acute territorial infarction, hemorrhage or intracranial mass. Atrophy and moderate advanced chronic small vessel ischemic changes of the white matter. Stable ventricle size. Small chronic infarcts in the left basal ganglia and cerebellum. Vascular: No hyperdense vessels.  Carotid vascular calcification Skull: Partially excluded right frontal bone.  No obvious fracture Sinuses/Orbits: Partially excluded right orbit. No obvious acute abnormality Other: None IMPRESSION: 1. No CT evidence for acute intracranial abnormality. 2. Atrophy and chronic small vessel ischemic changes of the white matter. Small chronic infarcts in the left basal ganglia and cerebellum. Electronically Signed   By: Luke Bun M.D.   On: 01/15/2024 15:05   DG Chest Port 1 View Result Date: 01/15/2024 CLINICAL DATA:  Hypotensive in unresponsive. Evaluate for abnormality. EXAM: PORTABLE CHEST 1 VIEW COMPARISON:  CT 12/15/2023 FINDINGS: There is a right chest wall dialysis catheter with tips in the right atrium. Heart size is normal. Aortic atherosclerotic calcifications. Linear scar versus platelike atelectasis noted within the lung bases. No pleural effusion, interstitial edema or consolidative change. Postop change from ACDF in the lower cervical spine. IMPRESSION: 1. Bibasilar scar versus platelike atelectasis. Electronically Signed   By: Waddell Calk M.D.   On: 01/15/2024 13:17    Microbiology: Results for  orders placed or performed during the hospital encounter of 01/15/24  Blood Culture (routine x 2)     Status: Abnormal   Collection Time: 01/15/24 12:42 PM    Specimen: BLOOD  Result Value Ref Range Status   Specimen Description   Final    BLOOD RIGHT ANTECUBITAL Performed at [facility], [address] [place], [place] [address]    Special Requests   Final    BOTTLES DRAWN AEROBIC AND ANAEROBIC Blood Culture results may not be optimal due to an inadequate volume of blood received in culture bottles Performed at [facility], [address], [place], [place] [address]    Culture  Setup Time   Final    GRAM NEGATIVE RODS AEROBIC BOTTLE ONLY CRITICAL VALUE NOTED.  VALUE IS CONSISTENT WITH PREVIOUSLY REPORTED AND CALLED VALUE. Performed at [facility], [address] [place], [place] [address]    Culture (A)  Final    ESCHERICHIA COLI SUSCEPTIBILITIES PERFORMED ON PREVIOUS CULTURE WITHIN THE LAST 5 DAYS. Performed at [facility], [address], [place], [place] [address]    Report Status 01/18/2024 FINAL  Final  Blood Culture (routine x 2)     Status: Abnormal   Collection Time: 01/15/24 12:42 PM   Specimen: BLOOD  Result Value Ref Range Status   Specimen Description   Final    BLOOD LEFT ANTECUBITAL Performed at [facility], [address] [place], [place] [address]    Special Requests   Final    BOTTLES DRAWN AEROBIC AND ANAEROBIC Blood Culture results may not be optimal due to an inadequate volume of blood received in culture bottles Performed at [facility], [address] [place], [place] [address]    Culture  Setup Time   Final    GRAM NEGATIVE RODS AEROBIC BOTTLE ONLY CRITICAL RESULT CALLED TO, READ BACK BY AND VERIFIED WITH: JASON ROBBINS, PHARMD @0132  01/16/2024 COP Performed at [facility], [address], [place], [place] [address]    Culture ESCHERICHIA COLI (A)  Final   Report Status 01/18/2024 FINAL  Final   Organism ID, Bacteria ESCHERICHIA COLI  Final   Organism ID, Bacteria ESCHERICHIA COLI  Final      Susceptibility   Escherichia coli -  KIRBY BAUER*    CEFAZOLIN  INTERMEDIATE Intermediate    Escherichia coli - MIC*    AMPICILLIN >=32 RESISTANT Resistant     CEFEPIME  <=0.12 SENSITIVE Sensitive     CEFTAZIDIME <=1 SENSITIVE Sensitive     CEFTRIAXONE  <=0.25 SENSITIVE Sensitive     CIPROFLOXACIN >=4 RESISTANT Resistant     GENTAMICIN <=1 SENSITIVE Sensitive     IMIPENEM <=0.25 SENSITIVE Sensitive     TRIMETH /SULFA  <=20 SENSITIVE Sensitive     AMPICILLIN/SULBACTAM >=32 RESISTANT Resistant     PIP/TAZO <=4 SENSITIVE Sensitive ug/mL    * ESCHERICHIA COLI    ESCHERICHIA COLI  Blood Culture ID Panel (Reflexed)     Status: Abnormal   Collection Time: 01/15/24 12:42 PM  Result Value Ref Range Status   Enterococcus faecalis NOT DETECTED NOT DETECTED Final   Enterococcus Faecium NOT DETECTED NOT DETECTED Final   Listeria monocytogenes NOT DETECTED NOT DETECTED Final   Staphylococcus species NOT DETECTED NOT DETECTED Final   Staphylococcus aureus (BCID) NOT DETECTED NOT DETECTED Final   Staphylococcus epidermidis NOT DETECTED NOT DETECTED Final   Staphylococcus lugdunensis NOT DETECTED NOT DETECTED Final   Streptococcus species NOT DETECTED NOT DETECTED Final  Streptococcus agalactiae NOT DETECTED NOT DETECTED Final   Streptococcus pneumoniae NOT DETECTED NOT DETECTED Final   Streptococcus pyogenes NOT DETECTED NOT DETECTED Final   A.calcoaceticus-baumannii NOT DETECTED NOT DETECTED Final   Bacteroides fragilis NOT DETECTED NOT DETECTED Final   Enterobacterales DETECTED (A) NOT DETECTED Final    Comment: Enterobacterales represent a large order of gram negative bacteria, not a single organism. CRITICAL RESULT CALLED TO, READ BACK BY AND VERIFIED WITH: JASON ROBBINS, PHARMD @0132  01/16/2024 COP    Enterobacter cloacae complex NOT DETECTED NOT DETECTED Final   Escherichia coli DETECTED (A) NOT DETECTED Final    Comment: CRITICAL RESULT CALLED TO, READ BACK BY AND VERIFIED WITH: JASON ROBBINS, PHARMD @0132  01/16/2024 COP     Klebsiella aerogenes NOT DETECTED NOT DETECTED Final   Klebsiella oxytoca NOT DETECTED NOT DETECTED Final   Klebsiella pneumoniae NOT DETECTED NOT DETECTED Final   Proteus species NOT DETECTED NOT DETECTED Final   Salmonella species NOT DETECTED NOT DETECTED Final   Serratia marcescens NOT DETECTED NOT DETECTED Final   Haemophilus influenzae NOT DETECTED NOT DETECTED Final   Neisseria meningitidis NOT DETECTED NOT DETECTED Final   Pseudomonas aeruginosa NOT DETECTED NOT DETECTED Final   Stenotrophomonas maltophilia NOT DETECTED NOT DETECTED Final   Candida albicans NOT DETECTED NOT DETECTED Final   Candida auris NOT DETECTED NOT DETECTED Final   Candida glabrata NOT DETECTED NOT DETECTED Final   Candida krusei NOT DETECTED NOT DETECTED Final   Candida parapsilosis NOT DETECTED NOT DETECTED Final   Candida tropicalis NOT DETECTED NOT DETECTED Final   Cryptococcus neoformans/gattii NOT DETECTED NOT DETECTED Final   CTX-M ESBL NOT DETECTED NOT DETECTED Final   Carbapenem resistance IMP NOT DETECTED NOT DETECTED Final   Carbapenem resistance KPC NOT DETECTED NOT DETECTED Final   Carbapenem resistance NDM NOT DETECTED NOT DETECTED Final   Carbapenem resist OXA 48 LIKE NOT DETECTED NOT DETECTED Final   Carbapenem resistance VIM NOT DETECTED NOT DETECTED Final    Comment: Performed at [facility], [address] [place], [place] [address]  MRSA Next Gen by PCR, Nasal     Status: None   Collection Time: 01/15/24  3:40 PM   Specimen: Nasal Mucosa; Nasal Swab  Result Value Ref Range Status   MRSA by PCR Next Gen NOT DETECTED NOT DETECTED Final    Comment: (NOTE) The GeneXpert MRSA Assay (FDA approved for NASAL specimens only), is one component of a comprehensive MRSA colonization surveillance program. It is not intended to diagnose MRSA infection nor to guide or monitor treatment for MRSA infections. Test performance is not FDA approved in patients less than 20  years old. Performed at [facility], [address] [place], [place] [address]   C Difficile Quick Screen w PCR reflex     Status: Abnormal   Collection Time: 01/16/24 10:32 AM   Specimen: Stool  Result Value Ref Range Status   C Diff antigen POSITIVE (A) NEGATIVE Final   C Diff toxin NEGATIVE NEGATIVE Final   C Diff interpretation Results are indeterminate. See PCR results.  Final    Comment: Performed at [facility], [address] [place], [place] [address]  Gastrointestinal Panel by PCR , Stool     Status: None   Collection Time: 01/16/24 10:32 AM   Specimen: Stool  Result Value Ref Range Status   Campylobacter species NOT DETECTED NOT DETECTED Final   Plesimonas shigelloides NOT DETECTED NOT DETECTED Final   Salmonella species NOT  DETECTED NOT DETECTED Final   Yersinia enterocolitica NOT DETECTED NOT DETECTED Final   Vibrio species NOT DETECTED NOT DETECTED Final   Vibrio cholerae NOT DETECTED NOT DETECTED Final   Enteroaggregative E coli (EAEC) NOT DETECTED NOT DETECTED Final   Enteropathogenic E coli (EPEC) NOT DETECTED NOT DETECTED Final   Enterotoxigenic E coli (ETEC) NOT DETECTED NOT DETECTED Final   Shiga like toxin producing E coli (STEC) NOT DETECTED NOT DETECTED Final   Shigella/Enteroinvasive E coli (EIEC) NOT DETECTED NOT DETECTED Final   Cryptosporidium NOT DETECTED NOT DETECTED Final   Cyclospora cayetanensis NOT DETECTED NOT DETECTED Final   Entamoeba histolytica NOT DETECTED NOT DETECTED Final   Giardia lamblia NOT DETECTED NOT DETECTED Final   Adenovirus F40/41 NOT DETECTED NOT DETECTED Final   Astrovirus NOT DETECTED NOT DETECTED Final   Norovirus GI/GII NOT DETECTED NOT DETECTED Final   Rotavirus A NOT DETECTED NOT DETECTED Final   Sapovirus (I, II, IV, and V) NOT DETECTED NOT DETECTED Final    Comment: Performed at [facility], [address] [place], [place] [address]  C. Diff by PCR, Reflexed     Status: Abnormal    Collection Time: 01/16/24 10:32 AM  Result Value Ref Range Status   Toxigenic C. Difficile by PCR POSITIVE (A) NEGATIVE Final    Comment: Positive for toxigenic C. difficile with little to no toxin production. Only treat if clinical presentation suggests symptomatic illness. Performed at [facility], [address] [place], [place] [address]   Cath Tip Culture     Status: None   Collection Time: 01/18/24  8:45 AM   Specimen: Catheter Tip; Other  Result Value Ref Range Status   Specimen Description   Final    CATH TIP Performed at [facility], [address] [place], [place] [address]    Special Requests   Final    NONE Performed at [facility], [address], [place], [place] [address]    Culture   Final    NO GROWTH 2 DAYS Performed at [facility], [address] [place], [place] [address]    Report Status 01/20/2024 FINAL  Final  Aerobic/Anaerobic Culture w Gram Stain (surgical/deep wound)     Status: None (Preliminary result)   Collection Time: 01/18/24 11:05 AM   Specimen: Liver; Abscess  Result Value Ref Range Status   Specimen Description   Final    LIVER 1 Performed at [facility], [address], [place], [place] [address]    Special Requests   Final    NONE Performed at [facility], [address] [place], [place] [address]    Gram Stain NO WBC SEEN NO ORGANISMS SEEN   Final   Culture   Final    NO GROWTH 3 DAYS NO ANAEROBES ISOLATED; CULTURE IN PROGRESS FOR 5 DAYS Performed at [facility], [address], [place], [place] [address]    Report Status PENDING  Incomplete  Aerobic/Anaerobic Culture w Gram Stain (surgical/deep wound)     Status: None (Preliminary result)   Collection Time: 01/18/24 11:06 AM   Specimen: Liver; Abscess  Result Value Ref Range Status   Specimen Description   Final    LIVER 2 Performed at [facility], [address], [place], [place] [address]     Special Requests   Final    NONE Performed at [facility], [address] [place], [place] [address]  Gram Stain   Final    ABUNDANT WBC PRESENT, PREDOMINANTLY PMN RARE GRAM NEGATIVE RODS Performed at [facility], [address] [place], [place] [address]    Culture   Final    RARE ESCHERICHIA COLI NO ANAEROBES ISOLATED; CULTURE IN PROGRESS FOR 5 DAYS    Report Status PENDING  Incomplete   Organism ID, Bacteria ESCHERICHIA COLI  Final      Susceptibility   Escherichia coli - MIC*    AMPICILLIN >=32 RESISTANT Resistant     CEFEPIME  <=0.12 SENSITIVE Sensitive     CEFTAZIDIME <=1 SENSITIVE Sensitive     CEFTRIAXONE  <=0.25 SENSITIVE Sensitive     CIPROFLOXACIN >=4 RESISTANT Resistant     GENTAMICIN <=1 SENSITIVE Sensitive     IMIPENEM <=0.25 SENSITIVE Sensitive     TRIMETH /SULFA  <=20 SENSITIVE Sensitive     AMPICILLIN/SULBACTAM >=32 RESISTANT Resistant     PIP/TAZO <=4 SENSITIVE Sensitive ug/mL    * RARE ESCHERICHIA COLI  Culture, blood (Routine X 2) w Reflex to ID Panel     Status: Abnormal (Preliminary result)   Collection Time: 01/19/24 12:32 PM   Specimen: BLOOD  Result Value Ref Range Status   Specimen Description   Final    BLOOD BLOOD RIGHT HAND Performed at [facility], [address] [place], [place] [address]    Special Requests   Final    BOTTLES DRAWN AEROBIC AND ANAEROBIC Blood Culture adequate volume Performed at [facility], [address] [place], [place] [address]    Culture  Setup Time   Final    GRAM POSITIVE COCCI IN BOTH AEROBIC AND ANAEROBIC BOTTLES CRITICAL RESULT CALLED TO, READ BACK BY AND VERIFIED WITH: L. KLUTTZ PHARMD, AT 1019 01/21/24 D. VANHOOK CORRECTED RESULTS PREVIOUSLY REPORTED AS: GRAM POSITIVE RODS CORRECTED RESULTS CALLED TO: L. KLUTTZ PHARMD, AT 1019 01/21/24 D. VANHOOK Performed at [facility], [address], [place], [place] [address]    Culture ENTEROCOCCUS FAECIUM (A)  Final    Report Status PENDING  Incomplete  Culture, blood (Routine X 2) w Reflex to ID Panel     Status: Abnormal (Preliminary result)   Collection Time: 01/19/24  1:14 PM   Specimen: BLOOD LEFT HAND  Result Value Ref Range Status   Specimen Description   Final    BLOOD LEFT HAND Performed at [facility], [address], [place], [place] [address]    Special Requests   Final    BOTTLES DRAWN AEROBIC AND ANAEROBIC Blood Culture results may not be optimal due to an inadequate volume of blood received in culture bottles Performed at [facility], [address] [place], [place] [address]    Culture  Setup Time   Final    GRAM POSITIVE COCCI AEROBIC BOTTLE ONLY CORRECTED RESULTS PREVIOUSLY REPORTED AS: GRAM POSITIVE RODS CORRECTED RESULTS CALLED TO: L. KLUTTZ PHARMD, AT 1019 01/21/24 D. VANHOOK Performed at [facility], [address], [place], [place] [address]    Culture ENTEROCOCCUS FAECIUM (A)  Final   Report Status PENDING  Incomplete    Labs: CBC: Recent Labs  Lab 01/15/24 1300 01/16/24 0419 01/17/24 0226 01/18/24 0405 01/18/24 1249 01/19/24 0333 01/20/24 0321  WBC 21.6*   < > 20.8* 20.8* 16.5* 15.1* 9.5  NEUTROABS 20.3*  --   --   --   --   --   --   HGB 9.1*   < > 7.4* 7.2* 7.5* 7.0* 8.6*  HCT 29.7*   < > 23.8* 23.5* 24.4* 23.3* 26.7*  MCV 93.4   < > 90.5 92.2 93.8 93.6 89.6  PLT 434*   < > 371 424* 366 412* 350   < > = values in this interval not displayed.   Basic Metabolic Panel: Recent Labs  Lab 01/16/24 0419 01/17/24 0226 01/18/24 0405 01/19/24 0333 01/20/24 0321  NA 135 136 136 136 140  K 3.7 3.8 3.6 3.3* 3.0*  CL 97* 98 96* 102 99  CO2 20* 21* 21* 18* 21*  GLUCOSE 104* 80 119* 115* 119*  BUN 45* 63* 82* 90* 101*  CREATININE 3.14* 3.78* 4.06* 4.40* 4.58*  CALCIUM  8.2* 8.5* 8.4* 8.4* 8.5*  MG 2.1 2.3 2.4 2.2 2.3  PHOS 4.7* 5.3* 4.8* 4.1 3.5   Liver Function Tests: Recent Labs  Lab 01/15/24 1300 01/16/24 0419 01/17/24 0226  01/18/24 0405 01/19/24 0333 01/20/24 0321  AST 89*  --  67* 46* 49* 58*  ALT 26  --  23 18 18 19   ALKPHOS 862*  --  532* 489* 564* 632*  BILITOT 0.7  --  0.9 0.7 0.8 0.8  PROT 7.4  --  5.8* 6.3* 6.4* 6.0*  ALBUMIN 2.3* 2.1* 2.0* 2.0* 1.9* 1.9*   CBG: Recent Labs  Lab 01/19/24 2019 01/20/24 0014 01/20/24 0403 01/20/24 0733 01/20/24 1153  GLUCAP 107* 114* 110* 110* 148*    Discharge time spent: greater than 30 minutes.  Signed: Aimee Somerset, MD [facility] 01/21/2024

## 2024-01-21 NOTE — Progress Notes (Signed)
 Patient with sepsis and a history of decubital ulcers with a biliary stent in place.  The patient was noted to have E. coli sepsis prior to having the ERCP originally.  The patient has now been made comfort care.  I am not planning to do any further procedures on this patient at this time.  I will sign off.  Please call if any further GI concerns or questions.  We would like to thank you for the opportunity to participate in the care of [facility].

## 2024-01-21 NOTE — Progress Notes (Signed)
 Writer gave handoff report to [employer] with Hadassah, RN. Nurse asked to leave IV in place for transport. All questions answered. Awaiting further orders for transport time.

## 2024-01-21 NOTE — Progress Notes (Signed)
                                                     Palliative Care Progress Note, Assessment & Plan   Patient Name: Sherry [name]       Date: 01/21/2024 DOB: 1955-12-19  Age: 68 y.o. MRN#: [id number] Attending Physician: Lanetta Lingo, MD Primary Care Physician: Caleen Dirks, MD Admit Date: 01/15/2024  Subjective: Patient is lying in bed with audible secretions/rhonchi.  She is not awakening to my presence.  2 nurse checks are at bedside attempting to reposition patient in bed.  No family or friends present during my visit.  HPI: 68 y.o. female  with past medical history significant for ESRD on HD, seizure disorder, chroninc hepatitis C, opioid use disorder, CVA, s/p gastrostomy tube, HTN, dCHF, depression with anxiety, bipolar, breast cancer s/p R mastectomy. She presented to [facility] ED from [facility] 01/15/2024 with c/o AMS and hypotension. It was reported that patient had received dialysis PTA and was somnolent throughout dialysis session.    ED workup significant for lactic 6.1, WBC 21.6, Hgb 9.1, Hct 29.7, plts 434, creatinine 3.1, BUN 33 and K+ 3.7. CXR negative.  BP 81/71, HR 100, RR 16, SpO2 100% RA, 98.6.    Of note, patient was previously admitted to [facility] 6/4-6/13/25 with E.coli bacteremia and signs of cholangitis. She underwent ERCP 6/6 that found choledocholithiasis. Sphincterotomy was performed and plastic stent placed.    She was admitted for management of sepsis, sacral wound with signs of sacral osteomyelitis.    Palliative team consulted for assistance with goals of care conversations.  Summary of counseling/coordination of care: Extensive chart review completed prior to meeting patient including labs, vital signs, imaging, progress notes, orders, and available advanced directive documents from current and previous encounters.   After  reviewing the patient's chart and assessing the patient at bedside, I spoke with patient in regards to symptom management and goals of care.   Symptoms assessed.  Nonverbal signs of pain or discomfort not noted.  However, audible secretions heard without auscultation.  Counseled with RN who is awaiting dose of Robinul  from pharmacy to be given.  Plan remains for patient to discharge to [facility].  TOC and hospice liaison following closely for discharge/transportation.  No further acute palliative needs at this time.  Physical Exam Vitals reviewed.  Constitutional:      General: She is not in acute distress.    Appearance: She is ill-appearing.  HENT:     Mouth/Throat:     Mouth: Mucous membranes are moist.     Comments: drooling Pulmonary:     Breath sounds: Rhonchi present.  Skin:    General: Skin is warm and dry.     Comments: UTA wounds  Neurological:     Comments: nonverbal             Total Time 25 minutes   Time spent includes: Detailed review of medical records (labs, imaging, vital signs), medically appropriate exam (mental status, respiratory, cardiac, skin), discussed with treatment team, counseling and educating patient, family and staff, documenting clinical information, medication management and coordination of care.  Lamarr L. Arvid, DNP, FNP-BC Palliative Medicine Team

## 2024-01-21 NOTE — Progress Notes (Incomplete)
 [facility] Liaison Note  Received request from Transitions of Care Manger for family interest in [facility].   Eligibility has been confirmed/pending.  Met/Spoke with patient and/or family to confirm interest and explain services.  Family agreeable to transfer today (or date bed available).  Transitions of Care manager aware.  RN please call report to [phone] ([facility])/[phone] [facility]) prior to patient leaving the unit.  Please send signed and completed DNR with patient at discharge.  Thank you  Marinell Nova,  [facility] Liaison [phone]

## 2024-01-21 NOTE — Progress Notes (Signed)
 PT Cancellation Note  Patient Details Name: Sherry [name] MRN: [id number] DOB: 1956/06/27   Cancelled Treatment:    Reason Eval/Treat Not Completed: Other (comment). Chart reviewed. Pt transitioned to comfort care, PT to sign off.    Doyal Shams PT, DPT 8:16 AM,01/21/24

## 2024-01-21 NOTE — Plan of Care (Signed)
   Problem: Education: Goal: Knowledge of General Education information will improve Description: Including pain rating scale, medication(s)/side effects and non-pharmacologic comfort measures Outcome: Not Progressing   Problem: Activity: Goal: Risk for activity intolerance will decrease Outcome: Not Progressing   Problem: Nutrition: Goal: Adequate nutrition will be maintained Outcome: Not Progressing

## 2024-01-21 NOTE — Progress Notes (Signed)
 [facility] Liaison note   Received request from Transitions of Care Manger for family interest in [facility].   Eligibility has been confirmed.  Spoke with patient's sister and nephew to confirm interest and explain services.  Family agreeable to transfer today.  Transitions of Care manager aware.  RN please call report to [phone] [facility]) prior to patient leaving the unit.  Please send signed and completed DNR with patient at discharge.  Thank you  Marinell Nova,  [facility] Liaison [phone]

## 2024-01-21 NOTE — Progress Notes (Signed)
 [facility] arrived to transport patient to [facility]. Report given to [facility] staff. Patient transporting via stretcher with IV in place per receiving RN request. Discharge paperwork including her DNR given to [facility] transport. Wound dressing belongings in patients room to transport with patient.

## 2024-01-21 NOTE — TOC Transition Note (Signed)
 Transition of Care [facility]) - Discharge Note   Patient Details  Name: Sherry [name] MRN: [id number] Date of Birth: 1955/10/18  Transition of Care [facility]) CM/SW Contact:  Alvaro Louder, LCSW Phone Number: 01/21/2024, 12:10 PM   Clinical Narrative:   Patient has orders to discharge to [facility] today. RN has called report, [facility] Liaison has arranged transport for 1:00 through [facility]. Transport paperwork is complete and in the discharge packet.   No further concerns, LCSWA signing off  Final next level of care: Hospice Medical Facility Barriers to Discharge: No Barriers Identified   Patient Goals and CMS Choice   CMS Medicare.gov Compare Post Acute Care list provided to:: Patient Represenative (must comment) Choice offered to / list presented to : Adult Children [place] ownership interest in [facility].provided to:: Adult Children    Discharge Placement              Patient chooses bed at:  [facility]) Patient to be transferred to facility by: [facility] Name of family member notified: Mercer Hint Patient and family notified of of transfer: 01/21/24  Discharge Plan and Services Additional resources added to the After Visit Summary for                                       Social Drivers of Health (SDOH) Interventions SDOH Screenings   Food Insecurity: Patient Unable To Answer (01/15/2024)  Housing: Patient Unable To Answer (01/15/2024)  Transportation Needs: Patient Unable To Answer (01/15/2024)  Utilities: Patient Unable To Answer (01/15/2024)  Alcohol  Screen: Medium Risk (08/27/2017)  Depression (PHQ2-9): Medium Risk (12/20/2020)  Social Connections: Patient Unable To Answer (01/15/2024)  Tobacco Use: High Risk (01/15/2024)     Readmission Risk Interventions    01/16/2024    4:23 PM 01/16/2024    9:55 AM  Readmission Risk Prevention Plan  Transportation Screening Complete   Medication Review (RN Care Manager) Complete Complete  PCP or  Specialist appointment within 3-5 days of discharge Complete Complete  HRI or Home Care Consult Not Complete   SW Recovery Care/Counseling Consult Complete   Palliative Care Screening Not Applicable   Skilled Nursing Facility Complete

## 2024-01-21 NOTE — Progress Notes (Signed)
 PHARMACY - PHYSICIAN COMMUNICATION CRITICAL VALUE ALERT - BLOOD CULTURE IDENTIFICATION (BCID)  Sherry [name] is an 68 y.o. female who presented to [facility] on 01/15/2024 with E coli bacteremia  ADDENDUM to below - lab just called and corrected the blood culture from [facility] to [facility] and E faecium identified.   Assessment:  Repeat blood cultures from 7/9 with [facility] in 1 bottle of each set.    Name of physician (or Provider) Contacted: Dr Fayette  Current antibiotics: Ceftriaxone  and metronidazole   Changes to prescribed antibiotics recommended:  Patient is on recommended antibiotics - No changes needed - goals of care discussions - moving toward comfort care    [name], PharmD, BCPS, BCIDP Work Cell: [phone] 01/21/2024 10:23 AM

## 2024-01-22 LAB — CULTURE, BLOOD (ROUTINE X 2): Special Requests: ADEQUATE

## 2024-01-23 LAB — BPAM RBC
Blood Product Expiration Date: 202508092359
Blood Product Expiration Date: 202508092359
Blood Product Expiration Date: 202508092359
ISSUE DATE / TIME: 202507091613
Unit Type and Rh: 5100
Unit Type and Rh: 5100
Unit Type and Rh: 5100

## 2024-01-23 LAB — TYPE AND SCREEN
ABO/RH(D): O POS
Antibody Screen: POSITIVE
DAT, IgG: NEGATIVE
DAT, complement: NEGATIVE
Unit division: 0
Unit division: 0
Unit division: 0

## 2024-01-24 LAB — AEROBIC/ANAEROBIC CULTURE W GRAM STAIN (SURGICAL/DEEP WOUND)
Culture: NO GROWTH
Gram Stain: NONE SEEN

## 2024-01-26 LAB — AEROBIC/ANAEROBIC CULTURE W GRAM STAIN (SURGICAL/DEEP WOUND)

## 2024-01-27 LAB — AEROBIC CULTURE W GRAM STAIN (SUPERFICIAL SPECIMEN)

## 2024-02-11 DEATH — deceased

## 2024-02-16 NOTE — Telephone Encounter (Signed)
 I called both numbers, one is for sister and one is for niece, no answer or VM for one and lmovm for the other

## 2024-04-18 ENCOUNTER — Encounter (HOSPITAL_COMMUNITY): Payer: Self-pay

## 2024-04-19 NOTE — Telephone Encounter (Signed)
 I have been unable to reach this patient by phone.  A letter is being sent.

## 2024-05-15 ENCOUNTER — Encounter: Payer: Self-pay | Admitting: Radiology

## 2024-05-30 ENCOUNTER — Encounter (HOSPITAL_COMMUNITY): Payer: Self-pay
